# Patient Record
Sex: Female | Born: 1950
Health system: Southern US, Community
[De-identification: ages and names within clinical notes are randomized; demographics above are authoritative.]

## PROBLEM LIST (undated history)

## (undated) ENCOUNTER — Emergency Department (HOSPITAL_COMMUNITY): Admission: EM | Payer: Medicare Other | Source: Home / Self Care

## (undated) ENCOUNTER — Emergency Department (HOSPITAL_COMMUNITY): Payer: Medicare Other | Source: Home / Self Care

## (undated) DIAGNOSIS — K922 Gastrointestinal hemorrhage, unspecified: Secondary | ICD-10-CM

## (undated) DIAGNOSIS — I499 Cardiac arrhythmia, unspecified: Secondary | ICD-10-CM

## (undated) DIAGNOSIS — G001 Pneumococcal meningitis: Secondary | ICD-10-CM

## (undated) DIAGNOSIS — J449 Chronic obstructive pulmonary disease, unspecified: Secondary | ICD-10-CM

## (undated) DIAGNOSIS — M797 Fibromyalgia: Secondary | ICD-10-CM

## (undated) DIAGNOSIS — F1911 Other psychoactive substance abuse, in remission: Secondary | ICD-10-CM

## (undated) DIAGNOSIS — I639 Cerebral infarction, unspecified: Secondary | ICD-10-CM

## (undated) DIAGNOSIS — R011 Cardiac murmur, unspecified: Secondary | ICD-10-CM

## (undated) DIAGNOSIS — K5790 Diverticulosis of intestine, part unspecified, without perforation or abscess without bleeding: Secondary | ICD-10-CM

## (undated) DIAGNOSIS — N281 Cyst of kidney, acquired: Secondary | ICD-10-CM

## (undated) DIAGNOSIS — R112 Nausea with vomiting, unspecified: Secondary | ICD-10-CM

## (undated) DIAGNOSIS — D62 Acute posthemorrhagic anemia: Secondary | ICD-10-CM

## (undated) DIAGNOSIS — K862 Cyst of pancreas: Secondary | ICD-10-CM

## (undated) DIAGNOSIS — A0472 Enterocolitis due to Clostridium difficile, not specified as recurrent: Secondary | ICD-10-CM

## (undated) DIAGNOSIS — I10 Essential (primary) hypertension: Secondary | ICD-10-CM

## (undated) DIAGNOSIS — M1990 Unspecified osteoarthritis, unspecified site: Secondary | ICD-10-CM

## (undated) DIAGNOSIS — C801 Malignant (primary) neoplasm, unspecified: Secondary | ICD-10-CM

## (undated) DIAGNOSIS — K219 Gastro-esophageal reflux disease without esophagitis: Secondary | ICD-10-CM

## (undated) DIAGNOSIS — Z9889 Other specified postprocedural states: Secondary | ICD-10-CM

## (undated) HISTORY — PX: ABDOMINAL HYSTERECTOMY: SHX81

## (undated) HISTORY — PX: OVARIAN CYST SURGERY: SHX726

## (undated) HISTORY — DX: Cerebral infarction, unspecified: I63.9

## (undated) HISTORY — DX: Gastro-esophageal reflux disease without esophagitis: K21.9

## (undated) HISTORY — PX: FRACTURE SURGERY: SHX138

## (undated) HISTORY — DX: Cardiac murmur, unspecified: R01.1

## (undated) HISTORY — DX: Cyst of pancreas: K86.2

## (undated) HISTORY — PX: APPENDECTOMY: SHX54

## (undated) HISTORY — PX: BREAST SURGERY: SHX581

## (undated) HISTORY — DX: Cyst of kidney, acquired: N28.1

## (undated) HISTORY — DX: Enterocolitis due to Clostridium difficile, not specified as recurrent: A04.72

## (undated) HISTORY — DX: Unspecified osteoarthritis, unspecified site: M19.90

## (undated) HISTORY — DX: Acute posthemorrhagic anemia: D62

## (undated) HISTORY — DX: Gastrointestinal hemorrhage, unspecified: K92.2

## (undated) HISTORY — DX: Diverticulosis of intestine, part unspecified, without perforation or abscess without bleeding: K57.90

## (undated) HISTORY — DX: Pneumococcal meningitis: G00.1

## (undated) HISTORY — PX: ABDOMINAL SURGERY: SHX537

---

## 1997-08-22 ENCOUNTER — Other Ambulatory Visit: Admission: RE | Admit: 1997-08-22 | Discharge: 1997-08-22 | Payer: Self-pay | Admitting: *Deleted

## 1997-08-31 ENCOUNTER — Other Ambulatory Visit: Admission: RE | Admit: 1997-08-31 | Discharge: 1997-08-31 | Payer: Self-pay | Admitting: *Deleted

## 1997-09-06 ENCOUNTER — Ambulatory Visit (HOSPITAL_COMMUNITY): Admission: RE | Admit: 1997-09-06 | Discharge: 1997-09-06 | Payer: Self-pay | Admitting: *Deleted

## 1997-10-19 ENCOUNTER — Emergency Department (HOSPITAL_COMMUNITY): Admission: EM | Admit: 1997-10-19 | Discharge: 1997-10-19 | Payer: Self-pay | Admitting: Emergency Medicine

## 1997-10-19 ENCOUNTER — Ambulatory Visit (HOSPITAL_COMMUNITY): Admission: RE | Admit: 1997-10-19 | Discharge: 1997-10-19 | Payer: Self-pay | Admitting: *Deleted

## 1997-10-27 ENCOUNTER — Ambulatory Visit (HOSPITAL_COMMUNITY): Admission: RE | Admit: 1997-10-27 | Discharge: 1997-10-27 | Payer: Self-pay | Admitting: *Deleted

## 1997-11-21 ENCOUNTER — Ambulatory Visit (HOSPITAL_BASED_OUTPATIENT_CLINIC_OR_DEPARTMENT_OTHER): Admission: RE | Admit: 1997-11-21 | Discharge: 1997-11-21 | Payer: Self-pay | Admitting: Urology

## 1997-11-21 ENCOUNTER — Encounter: Payer: Self-pay | Admitting: Urology

## 1997-12-02 ENCOUNTER — Encounter: Payer: Self-pay | Admitting: Gastroenterology

## 1997-12-02 ENCOUNTER — Encounter: Payer: Self-pay | Admitting: Emergency Medicine

## 1997-12-02 ENCOUNTER — Inpatient Hospital Stay (HOSPITAL_COMMUNITY): Admission: EM | Admit: 1997-12-02 | Discharge: 1997-12-04 | Payer: Self-pay | Admitting: Emergency Medicine

## 1997-12-24 ENCOUNTER — Emergency Department (HOSPITAL_COMMUNITY): Admission: EM | Admit: 1997-12-24 | Discharge: 1997-12-24 | Payer: Self-pay | Admitting: Emergency Medicine

## 1998-04-08 ENCOUNTER — Emergency Department (HOSPITAL_COMMUNITY): Admission: EM | Admit: 1998-04-08 | Discharge: 1998-04-08 | Payer: Self-pay | Admitting: Emergency Medicine

## 1998-10-29 ENCOUNTER — Emergency Department (HOSPITAL_COMMUNITY): Admission: EM | Admit: 1998-10-29 | Discharge: 1998-10-29 | Payer: Self-pay | Admitting: Emergency Medicine

## 1998-10-29 ENCOUNTER — Encounter: Payer: Self-pay | Admitting: Emergency Medicine

## 1999-02-06 ENCOUNTER — Emergency Department (HOSPITAL_COMMUNITY): Admission: EM | Admit: 1999-02-06 | Discharge: 1999-02-06 | Payer: Self-pay | Admitting: Emergency Medicine

## 1999-02-06 ENCOUNTER — Encounter: Payer: Self-pay | Admitting: Emergency Medicine

## 1999-02-07 ENCOUNTER — Inpatient Hospital Stay (HOSPITAL_COMMUNITY): Admission: EM | Admit: 1999-02-07 | Discharge: 1999-02-09 | Payer: Self-pay | Admitting: Emergency Medicine

## 1999-02-27 ENCOUNTER — Emergency Department (HOSPITAL_COMMUNITY): Admission: EM | Admit: 1999-02-27 | Discharge: 1999-02-27 | Payer: Self-pay

## 1999-07-25 ENCOUNTER — Encounter: Payer: Self-pay | Admitting: Emergency Medicine

## 1999-07-25 ENCOUNTER — Emergency Department (HOSPITAL_COMMUNITY): Admission: EM | Admit: 1999-07-25 | Discharge: 1999-07-25 | Payer: Self-pay | Admitting: Emergency Medicine

## 1999-10-16 ENCOUNTER — Emergency Department (HOSPITAL_COMMUNITY): Admission: EM | Admit: 1999-10-16 | Discharge: 1999-10-16 | Payer: Self-pay | Admitting: Emergency Medicine

## 2000-01-07 ENCOUNTER — Emergency Department (HOSPITAL_COMMUNITY): Admission: EM | Admit: 2000-01-07 | Discharge: 2000-01-07 | Payer: Self-pay | Admitting: Emergency Medicine

## 2000-03-10 ENCOUNTER — Other Ambulatory Visit (HOSPITAL_COMMUNITY): Admission: RE | Admit: 2000-03-10 | Discharge: 2000-03-19 | Payer: Self-pay | Admitting: Psychiatry

## 2000-04-01 ENCOUNTER — Encounter: Payer: Self-pay | Admitting: Infectious Diseases

## 2000-04-01 ENCOUNTER — Inpatient Hospital Stay (HOSPITAL_COMMUNITY): Admission: EM | Admit: 2000-04-01 | Discharge: 2000-04-03 | Payer: Self-pay | Admitting: *Deleted

## 2000-04-02 ENCOUNTER — Encounter: Payer: Self-pay | Admitting: Infectious Diseases

## 2000-04-17 ENCOUNTER — Emergency Department (HOSPITAL_COMMUNITY): Admission: EM | Admit: 2000-04-17 | Discharge: 2000-04-17 | Payer: Self-pay | Admitting: Emergency Medicine

## 2000-04-21 ENCOUNTER — Emergency Department (HOSPITAL_COMMUNITY): Admission: EM | Admit: 2000-04-21 | Discharge: 2000-04-21 | Payer: Self-pay | Admitting: Emergency Medicine

## 2000-04-29 ENCOUNTER — Encounter: Admission: RE | Admit: 2000-04-29 | Discharge: 2000-04-29 | Payer: Self-pay | Admitting: Internal Medicine

## 2000-11-17 ENCOUNTER — Emergency Department (HOSPITAL_COMMUNITY): Admission: EM | Admit: 2000-11-17 | Discharge: 2000-11-17 | Payer: Self-pay

## 2001-07-27 ENCOUNTER — Encounter: Payer: Self-pay | Admitting: *Deleted

## 2001-07-27 ENCOUNTER — Inpatient Hospital Stay (HOSPITAL_COMMUNITY): Admission: EM | Admit: 2001-07-27 | Discharge: 2001-07-29 | Payer: Self-pay | Admitting: Emergency Medicine

## 2001-07-30 ENCOUNTER — Encounter: Admission: RE | Admit: 2001-07-30 | Discharge: 2001-07-30 | Payer: Self-pay | Admitting: Sports Medicine

## 2001-08-11 ENCOUNTER — Ambulatory Visit (HOSPITAL_COMMUNITY): Admission: RE | Admit: 2001-08-11 | Discharge: 2001-08-11 | Payer: Self-pay | Admitting: Family Medicine

## 2001-08-11 ENCOUNTER — Encounter: Admission: RE | Admit: 2001-08-11 | Discharge: 2001-08-11 | Payer: Self-pay | Admitting: Sports Medicine

## 2001-08-18 ENCOUNTER — Encounter: Admission: RE | Admit: 2001-08-18 | Discharge: 2001-08-18 | Payer: Self-pay | Admitting: *Deleted

## 2001-08-19 ENCOUNTER — Emergency Department (HOSPITAL_COMMUNITY): Admission: EM | Admit: 2001-08-19 | Discharge: 2001-08-19 | Payer: Self-pay | Admitting: Emergency Medicine

## 2001-08-19 ENCOUNTER — Encounter: Payer: Self-pay | Admitting: Emergency Medicine

## 2001-08-21 ENCOUNTER — Encounter: Admission: RE | Admit: 2001-08-21 | Discharge: 2001-08-21 | Payer: Self-pay | Admitting: Family Medicine

## 2001-08-26 ENCOUNTER — Encounter: Admission: RE | Admit: 2001-08-26 | Discharge: 2001-08-26 | Payer: Self-pay | Admitting: Family Medicine

## 2002-03-27 ENCOUNTER — Encounter: Payer: Self-pay | Admitting: Emergency Medicine

## 2002-03-27 ENCOUNTER — Emergency Department (HOSPITAL_COMMUNITY): Admission: EM | Admit: 2002-03-27 | Discharge: 2002-03-27 | Payer: Self-pay | Admitting: Emergency Medicine

## 2002-04-14 ENCOUNTER — Inpatient Hospital Stay (HOSPITAL_COMMUNITY): Admission: EM | Admit: 2002-04-14 | Discharge: 2002-04-27 | Payer: Self-pay | Admitting: Psychiatry

## 2002-04-18 ENCOUNTER — Encounter: Payer: Self-pay | Admitting: Emergency Medicine

## 2002-04-18 ENCOUNTER — Emergency Department (HOSPITAL_COMMUNITY): Admission: EM | Admit: 2002-04-18 | Discharge: 2002-04-18 | Payer: Self-pay | Admitting: Emergency Medicine

## 2002-11-03 ENCOUNTER — Ambulatory Visit (HOSPITAL_COMMUNITY): Admission: RE | Admit: 2002-11-03 | Discharge: 2002-11-03 | Payer: Self-pay | Admitting: Internal Medicine

## 2002-11-03 ENCOUNTER — Encounter: Payer: Self-pay | Admitting: Internal Medicine

## 2002-11-04 ENCOUNTER — Emergency Department (HOSPITAL_COMMUNITY): Admission: EM | Admit: 2002-11-04 | Discharge: 2002-11-05 | Payer: Self-pay | Admitting: Emergency Medicine

## 2002-11-05 ENCOUNTER — Encounter: Payer: Self-pay | Admitting: Emergency Medicine

## 2003-03-12 DIAGNOSIS — F1911 Other psychoactive substance abuse, in remission: Secondary | ICD-10-CM

## 2003-03-12 HISTORY — DX: Other psychoactive substance abuse, in remission: F19.11

## 2003-09-27 ENCOUNTER — Encounter: Admission: RE | Admit: 2003-09-27 | Discharge: 2003-09-27 | Payer: Self-pay | Admitting: Family Medicine

## 2003-10-18 ENCOUNTER — Encounter: Admission: RE | Admit: 2003-10-18 | Discharge: 2003-10-18 | Payer: Self-pay | Admitting: Family Medicine

## 2004-01-14 ENCOUNTER — Emergency Department (HOSPITAL_COMMUNITY): Admission: EM | Admit: 2004-01-14 | Discharge: 2004-01-14 | Payer: Self-pay | Admitting: Emergency Medicine

## 2004-01-15 ENCOUNTER — Emergency Department (HOSPITAL_COMMUNITY): Admission: EM | Admit: 2004-01-15 | Discharge: 2004-01-16 | Payer: Self-pay | Admitting: Emergency Medicine

## 2004-01-16 ENCOUNTER — Emergency Department: Payer: Self-pay | Admitting: Emergency Medicine

## 2004-01-27 ENCOUNTER — Emergency Department (HOSPITAL_COMMUNITY): Admission: EM | Admit: 2004-01-27 | Discharge: 2004-01-27 | Payer: Self-pay | Admitting: Emergency Medicine

## 2004-12-30 ENCOUNTER — Emergency Department (HOSPITAL_COMMUNITY): Admission: EM | Admit: 2004-12-30 | Discharge: 2004-12-31 | Payer: Self-pay | Admitting: *Deleted

## 2006-05-08 DIAGNOSIS — F411 Generalized anxiety disorder: Secondary | ICD-10-CM

## 2006-05-08 DIAGNOSIS — F17210 Nicotine dependence, cigarettes, uncomplicated: Secondary | ICD-10-CM | POA: Insufficient documentation

## 2006-05-08 DIAGNOSIS — J45909 Unspecified asthma, uncomplicated: Secondary | ICD-10-CM | POA: Insufficient documentation

## 2006-05-08 DIAGNOSIS — K219 Gastro-esophageal reflux disease without esophagitis: Secondary | ICD-10-CM | POA: Insufficient documentation

## 2006-05-08 DIAGNOSIS — R569 Unspecified convulsions: Secondary | ICD-10-CM | POA: Insufficient documentation

## 2006-05-08 DIAGNOSIS — F419 Anxiety disorder, unspecified: Secondary | ICD-10-CM | POA: Insufficient documentation

## 2006-05-08 DIAGNOSIS — K5900 Constipation, unspecified: Secondary | ICD-10-CM | POA: Insufficient documentation

## 2006-05-30 ENCOUNTER — Emergency Department (HOSPITAL_COMMUNITY): Admission: EM | Admit: 2006-05-30 | Discharge: 2006-05-30 | Payer: Self-pay | Admitting: Family Medicine

## 2006-05-31 ENCOUNTER — Emergency Department (HOSPITAL_COMMUNITY): Admission: EM | Admit: 2006-05-31 | Discharge: 2006-05-31 | Payer: Self-pay | Admitting: Emergency Medicine

## 2006-06-11 ENCOUNTER — Emergency Department (HOSPITAL_COMMUNITY): Admission: EM | Admit: 2006-06-11 | Discharge: 2006-06-12 | Payer: Self-pay | Admitting: Emergency Medicine

## 2006-06-15 ENCOUNTER — Emergency Department (HOSPITAL_COMMUNITY): Admission: EM | Admit: 2006-06-15 | Discharge: 2006-06-15 | Payer: Self-pay | Admitting: Emergency Medicine

## 2006-09-07 ENCOUNTER — Emergency Department (HOSPITAL_COMMUNITY): Admission: EM | Admit: 2006-09-07 | Discharge: 2006-09-07 | Payer: Self-pay | Admitting: Emergency Medicine

## 2006-09-08 ENCOUNTER — Emergency Department (HOSPITAL_COMMUNITY): Admission: EM | Admit: 2006-09-08 | Discharge: 2006-09-08 | Payer: Self-pay | Admitting: Emergency Medicine

## 2006-09-09 ENCOUNTER — Emergency Department (HOSPITAL_COMMUNITY): Admission: EM | Admit: 2006-09-09 | Discharge: 2006-09-09 | Payer: Self-pay | Admitting: Emergency Medicine

## 2006-09-11 ENCOUNTER — Emergency Department (HOSPITAL_COMMUNITY): Admission: EM | Admit: 2006-09-11 | Discharge: 2006-09-11 | Payer: Self-pay | Admitting: Emergency Medicine

## 2006-09-17 ENCOUNTER — Emergency Department (HOSPITAL_COMMUNITY): Admission: EM | Admit: 2006-09-17 | Discharge: 2006-09-17 | Payer: Self-pay | Admitting: Family Medicine

## 2006-10-01 ENCOUNTER — Emergency Department (HOSPITAL_COMMUNITY): Admission: EM | Admit: 2006-10-01 | Discharge: 2006-10-01 | Payer: Self-pay | Admitting: Emergency Medicine

## 2006-10-04 ENCOUNTER — Emergency Department (HOSPITAL_COMMUNITY): Admission: EM | Admit: 2006-10-04 | Discharge: 2006-10-04 | Payer: Self-pay | Admitting: Emergency Medicine

## 2006-10-19 ENCOUNTER — Emergency Department (HOSPITAL_COMMUNITY): Admission: EM | Admit: 2006-10-19 | Discharge: 2006-10-19 | Payer: Self-pay | Admitting: Emergency Medicine

## 2006-12-06 ENCOUNTER — Emergency Department (HOSPITAL_COMMUNITY): Admission: EM | Admit: 2006-12-06 | Discharge: 2006-12-06 | Payer: Self-pay | Admitting: Emergency Medicine

## 2006-12-11 ENCOUNTER — Emergency Department (HOSPITAL_COMMUNITY): Admission: EM | Admit: 2006-12-11 | Discharge: 2006-12-11 | Payer: Self-pay | Admitting: Emergency Medicine

## 2006-12-31 ENCOUNTER — Emergency Department (HOSPITAL_COMMUNITY): Admission: EM | Admit: 2006-12-31 | Discharge: 2006-12-31 | Payer: Self-pay | Admitting: Emergency Medicine

## 2007-01-22 ENCOUNTER — Inpatient Hospital Stay (HOSPITAL_COMMUNITY): Admission: EM | Admit: 2007-01-22 | Discharge: 2007-01-22 | Payer: Self-pay | Admitting: Emergency Medicine

## 2007-01-24 ENCOUNTER — Inpatient Hospital Stay (HOSPITAL_COMMUNITY): Admission: EM | Admit: 2007-01-24 | Discharge: 2007-01-26 | Payer: Self-pay | Admitting: *Deleted

## 2007-04-02 ENCOUNTER — Emergency Department (HOSPITAL_COMMUNITY): Admission: EM | Admit: 2007-04-02 | Discharge: 2007-04-02 | Payer: Self-pay | Admitting: Emergency Medicine

## 2007-05-20 ENCOUNTER — Emergency Department (HOSPITAL_COMMUNITY): Admission: EM | Admit: 2007-05-20 | Discharge: 2007-05-20 | Payer: Self-pay | Admitting: Emergency Medicine

## 2009-04-04 ENCOUNTER — Emergency Department (HOSPITAL_COMMUNITY): Admission: EM | Admit: 2009-04-04 | Discharge: 2009-04-04 | Payer: Self-pay | Admitting: Emergency Medicine

## 2009-04-06 ENCOUNTER — Emergency Department (HOSPITAL_COMMUNITY): Admission: EM | Admit: 2009-04-06 | Discharge: 2009-04-07 | Payer: Self-pay | Admitting: Emergency Medicine

## 2010-03-15 ENCOUNTER — Encounter
Admission: RE | Admit: 2010-03-15 | Discharge: 2010-03-15 | Payer: Self-pay | Source: Home / Self Care | Attending: Specialist | Admitting: Specialist

## 2010-04-01 ENCOUNTER — Encounter: Payer: Self-pay | Admitting: Emergency Medicine

## 2010-04-01 ENCOUNTER — Encounter: Payer: Self-pay | Admitting: Neurosurgery

## 2010-05-27 LAB — COMPREHENSIVE METABOLIC PANEL
ALT: 31 U/L (ref 0–35)
AST: 38 U/L — ABNORMAL HIGH (ref 0–37)
Albumin: 4.5 g/dL (ref 3.5–5.2)
Alkaline Phosphatase: 73 U/L (ref 39–117)
BUN: 13 mg/dL (ref 6–23)
CO2: 27 mEq/L (ref 19–32)
Calcium: 9.9 mg/dL (ref 8.4–10.5)
Chloride: 102 mEq/L (ref 96–112)
Creatinine, Ser: 0.7 mg/dL (ref 0.4–1.2)
GFR calc Af Amer: >60 mL/min (ref >60)
GFR calc non Af Amer: >60 mL/min (ref >60)
Glucose, Bld: 115 mg/dL — ABNORMAL HIGH (ref 70–99)
Potassium: 3.5 mEq/L (ref 3.5–5.1)
Sodium: 139 mEq/L (ref 135–145)
Total Bilirubin: 1.4 mg/dL — ABNORMAL HIGH (ref 0.3–1.2)
Total Protein: 7.5 g/dL (ref 6.0–8.3)

## 2010-05-27 LAB — POCT I-STAT, CHEM 8
BUN: 10 mg/dL (ref 6–23)
Calcium, Ion: 1.17 mmol/L (ref 1.12–1.32)
Chloride: 109 mEq/L (ref 96–112)
Creatinine, Ser: 0.8 mg/dL (ref 0.4–1.2)
Glucose, Bld: 91 mg/dL (ref 70–99)
HCT: 43 % (ref 36.0–46.0)
Hemoglobin: 14.6 g/dL (ref 12.0–15.0)
Potassium: 3.4 mEq/L — ABNORMAL LOW (ref 3.5–5.1)
Sodium: 141 mEq/L (ref 135–145)
TCO2: 29 mmol/L (ref 0–100)

## 2010-05-27 LAB — DIFFERENTIAL
Basophils Absolute: 0.1 10*3/uL (ref 0.0–0.1)
Basophils Absolute: 0.1 10*3/uL (ref 0.0–0.1)
Basophils Relative: 1 % (ref 0–1)
Basophils Relative: 1 % (ref 0–1)
Eosinophils Absolute: 0 10*3/uL (ref 0.0–0.7)
Eosinophils Absolute: 0 10*3/uL (ref 0.0–0.7)
Eosinophils Relative: 0 % (ref 0–5)
Eosinophils Relative: 0 % (ref 0–5)
Lymphocytes Relative: 14 % (ref 12–46)
Lymphocytes Relative: 23 % (ref 12–46)
Lymphs Abs: 2.3 10*3/uL (ref 0.7–4.0)
Lymphs Abs: 3.8 10*3/uL (ref 0.7–4.0)
Monocytes Absolute: 0.7 10*3/uL (ref 0.1–1.0)
Monocytes Absolute: 1 10*3/uL (ref 0.1–1.0)
Monocytes Relative: 4 % (ref 3–12)
Monocytes Relative: 6 % (ref 3–12)
Neutro Abs: 12 10*3/uL — ABNORMAL HIGH (ref 1.7–7.7)
Neutro Abs: 12.6 10*3/uL — ABNORMAL HIGH (ref 1.7–7.7)
Neutrophils Relative %: 72 % (ref 43–77)
Neutrophils Relative %: 79 % — ABNORMAL HIGH (ref 43–77)

## 2010-05-27 LAB — CBC
HCT: 38.9 % (ref 36.0–46.0)
HCT: 43.1 % (ref 36.0–46.0)
Hemoglobin: 13.7 g/dL (ref 12.0–15.0)
Hemoglobin: 14.9 g/dL (ref 12.0–15.0)
MCHC: 34.6 g/dL (ref 30.0–36.0)
MCHC: 35.2 g/dL (ref 30.0–36.0)
MCV: 94.4 fL (ref 78.0–100.0)
MCV: 94.6 fL (ref 78.0–100.0)
Platelets: 357 10*3/uL (ref 150–400)
Platelets: 406 10*3/uL — ABNORMAL HIGH (ref 150–400)
RBC: 4.11 MIL/uL (ref 3.87–5.11)
RBC: 4.57 MIL/uL (ref 3.87–5.11)
RDW: 13.9 % (ref 11.5–15.5)
RDW: 14 % (ref 11.5–15.5)
WBC: 16 10*3/uL — ABNORMAL HIGH (ref 4.0–10.5)
WBC: 16.6 10*3/uL — ABNORMAL HIGH (ref 4.0–10.5)

## 2010-05-27 LAB — URINE MICROSCOPIC-ADD ON

## 2010-05-27 LAB — POCT CARDIAC MARKERS
CKMB, poc: 1.6 ng/mL (ref 1.0–8.0)
Myoglobin, poc: 65.4 ng/mL (ref 12–200)
Troponin i, poc: <0.05 ng/mL (ref 0.00–0.09)

## 2010-05-27 LAB — URINALYSIS, ROUTINE W REFLEX MICROSCOPIC
Glucose, UA: NEGATIVE mg/dL
Ketones, ur: 40 mg/dL — AB
Nitrite: NEGATIVE
Protein, ur: 30 mg/dL — AB
Specific Gravity, Urine: 1.023 (ref 1.005–1.030)
Urobilinogen, UA: 1 mg/dL (ref 0.0–1.0)
pH: 6 (ref 5.0–8.0)

## 2010-05-27 LAB — LIPASE, BLOOD: Lipase: 21 U/L (ref 11–59)

## 2010-07-24 NOTE — Discharge Summary (Signed)
NAMEDemetrius Charity NO.:  [id number]   MEDICAL RECORD NO.:  [id number]          PATIENT TYPE:  INP   LOCATION:  1528                         FACILITY:  [facility]   PHYSICIAN:  Michaelyn Barter, M.D. DATE OF BIRTH:  02-Sep-1950   DATE OF ADMISSION:  01/24/2007  DATE OF DISCHARGE:  01/26/2007                               DISCHARGE SUMMARY   PATIENT PRIMARY CARE DOCTOR:  Unassigned.   FINAL DIAGNOSES:  1. Colitis.  2. Hypokalemia.  3. Hypoalbuminemia.  4. Chronic pain syndrome.  5. Hepatomegaly with a hyperdense liver seen on CT scan of the abdomen      and pelvis   PROCEDURES:  1. Acute abdominal x-ray completed January 22, 2007.  2. CT scan of the abdomen and pelvis completed January 22, 2007.  3. Acute abdominal x-ray completed January 24, 2007.   HISTORY OF PRESENT ILLNESS:  [name] is a 60 year old female who was  initially admitted into the hospital 2 days prior to this admission.  She subsequently signed herself out AMA.  She indicated that she had  continued to experience diarrhea accompanied by nausea and vomiting.  She also complained of some low grade fevers and chills.   PAST MEDICAL HISTORY:  Please see that dictated by Dr. Jonna Munro.   HOSPITAL COURSE:  #1 - COLITIS.  An acute abdominal x-ray was completed  on November 13 it revealed no acute chest or abdominal disease.  CT scan  of the patient's abdomen and pelvis were completed on January 22, 2007.  Hepatomegaly with a hyperdense liver was seen and differential  considerations included hemochromatosis or glycogen storage disease.  Amiodarone therapy could also create a similar appearance.  A CT scan of  the pelvis revealed diffuse wall thickening of the colon compatible with  colitis, likely infectious or inflammatory.  IV ciprofloxacin and  metronidazole were started as well as p.r.n. pain medication.  The  patient's pain was slow to resolve.  She remained afebrile.  Her  white  blood cell count remained within normal limits.  By the day of  discharge, the patient indicated that she felt better.  She indicated  that her pain was controlled, and she requested to be discharged from  the hospital.   #2 - HYPOKALEMIA.  The patient's potassium level was noted to be 2.8 on  November 15.  For this, she received potassium supplementation.   #3 - HYPOALBUMINEMIA.  There may have been an element of mild protein  calorie malnutrition secondary to this.  This was monitored as the  patient was initially n.p.o. and her diet was progressed.   #4 - CHRONIC PAIN.  The patient did complain of pain in various  locations during the course of her hospitalization.  The acuity of these  complaints were questionable.  She was provided with p.r.n. pain  medication.  On the date of discharge, the patient indicated that she  felt better and she requested to be discharged home.  Her vitals are  temperature was 96.9, heart rate 68, respirations 16, blood pressure  110/78, O2 sat 98%  on room air.  Her sodium was 143, potassium 4.0,  chloride 111, CO2 28, BUN 5, creatinine 0.63, glucose 137, calcium 8.5,  white blood cell count 7.3, hemoglobin 11.6, hematocrit 33.6, platelets  361.  The decision was made to discharge the patient from the hospital.   DISCHARGE MEDICATIONS:  1. Protonix 40 mg one tablet p.o. daily.  2. Oxycodone 5 mg one tablet p.o. q.6 h p.r.n.  3. Zofran 4 mg one tablet p.o. q.6 h p.r.n.  4. Ciprofloxacin 250 mg one tablet p.o. q.12 h.  5. Metronidazole 500 mg one tablet p.o. q.8 h.  6. Xanax 0.5 mg 1-2 p.o. b.i.d.   FOLLOWUP:  The patient will be told to follow-up with her primary care  doctor within 1-2 weeks.  She will also be told to follow-up with  regards to the CT scan finding of hepatomegaly with hyperdense liver.      Michaelyn Barter, M.D.  Electronically Signed     OR/MEDQ  D:  01/26/2007  T:  01/26/2007  Job:  [id number]
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
## 2010-07-27 NOTE — H&P (Signed)
NAMEDemetrius Cardenas NO.:  [id number]   MEDICAL RECORD NO.:  [id number]                   PATIENT TYPE:  IPS   LOCATION:  0505                                 [facility]ACILITY[facility]  [facility]   PHYSICIAN:  [name], M.D.              DATE OF BIRTH:  09/20/1950   DATE OF ADMISSION:  04/14/2002  DATE OF DISCHARGE:  04/27/2002                         PSYCHIATRIC ADMISSION ASSESSMENT   IDENTIFYING INFORMATION:  This is a 60 year old Caucasian female who is an  involuntary admission.   HISTORY OF PRESENT ILLNESS:  This patient was transferred from [facility] after being there for a witnessed seizure that started when she was  in prison.  Apparently, it was serious enough that the patient lost her gag  reflex and had to be intubated.  She was loaded with Dilantin at that time  and, after she became awake in the course of her workup, discovered that she  had overdosed herself on Klonopin.  She has a long history of opiate abuse,  abusing methadone and other opiates and also benzodiazepine use.  She  stated, to the doctors at [facility], I took too much Klonopin.  The patient's history today is taken from the records.  She is delusional  and confused and is an unreliable historian.   PAST PSYCHIATRIC HISTORY:  This is the patient's first admission to [facility]  She has two prior admissions to the internal  medicine service.  She has a previous history of multiple admissions to  [facility] and other detox facilities.   SOCIAL HISTORY:  The patient has a 12th grade education.  Her husband is  listed as Sharman Crate and we have attempted to contact him today.  However,  he has no phone number.  The patient has Medicare and Medicaid insurance,  which will help her pay for medications.  She is unable to tell us little  else about her history.   FAMILY HISTORY:  Not available.   ALCOHOL/DRUG HISTORY:  The  patient has a known history of abusing opiates  and benzodiazepines.   PAST MEDICAL HISTORY:  The patient is followed by Dr. Tiburcio Pea apparently at  the [facility], which is a methadone clinic in town.  Medical problems  include a history of hypertension, controlled with medications, history of  heart trouble, complicated by congestive heart failure in the past, some  visual impairment of her left eye.  Past medical history is remarkable for a  history of gastric ulcer.  She is status post hysterectomy and it is noted  in her hospital records as having a myocardial infarction twice in the past.   MEDICATIONS:  Noted from at the time she was transferred.  Dilantin 300 mg  p.o. q.h.s., Lasix 40 mg q.d., Remeron 15 mg p.o. q.h.s., Neurontin 100 mg  p.o. t.i.d., Vistaril 50 mg q.6h. p.r.n.  for anxiety, Flagyl 500 mg p.o.  t.i.d. and Zithromax 500 mg p.o. q.d.   ALLERGIES:  ASPIRIN, PENTAZAFINE, CODEINE and IBUPROFEN.   PHYSICAL EXAMINATION:  The patient's physical examination was done at  [facility] and is noted in the record.  At that time, she was  intubated and on a ventilator for support.  Her lungs were clear to  auscultation.  Today, her lungs continue to sound generally clear.  Her  heart sounds are grossly normal and synchronous with her radial pulse and of  regular rate and rhythm.  Vital signs, on admission to the unit, are within  normal limits.   LABORATORY DATA:  The patient's WBC was noted as mildly elevated at 14,100.  Most recent liver enzymes were within normal limits as was her creatinine  and BUN and her cardiac enzymes were negative.  Her RPR was noted as  nonreactive.   MENTAL STATUS EXAM:  This is a thin, frail-appearing, white female who is  wandering in the hall in a gown.  She responds to questions with a perplexed  look and inappropriate smile.  She looks around and is generally confused  and looks lost.  Her affect is blunted and she states to me I want  to know  when I can go downstairs.  I'm going to be put to sleep and killed.  Speech  lacks spontaneity but shows no pressure and is generally soft in tone,  somewhat anxious.  Mood is depressed and anxious.  Thought processes are  remarkable for confusion and delusional thoughts.  She is positive for  suicidal ideation but no clear plan and generally seems to be responding to  internal stimuli.  Cognitively, she is intact to person and place and she  does know what day it is but obviously does not appreciate her situation.   DIAGNOSES:   AXIS I:  1. Psychosis not otherwise specified.  2. Benzodiazepine abuse and dependence.  3. Opiate dependence by history.   AXIS II:  Deferred.   AXIS III:  1. Seizure disorder not otherwise specified, probably secondary to     benzodiazepine withdrawal.  2. She is status post overdose on benzodiazepines.  3. She is also status post myocardial infarction with a history of     congestive heart failure.   AXIS IV:  Deferred.   AXIS V:  Current 10; past year unknown.   PLAN:  Involuntarily admit the patient to evaluate her mental status.  We  have elected to start her on a Librium protocol with no loading dose at this  point.  We are going to place her on fall precautions for her own safety and  immediately get baseline labs on her including a Dilantin level, CBC,  thyroid panel and CMET.  Meanwhile, we will start her on a gradual taper of  her methadone with now starting her on 20 mg p.o. b.i.d. and will also taper  her off her azithromycin and will recheck her CBC levels since she was  previously intubated.  We are also going to start her on Zyprexa 2.5 mg p.o.  b.i.d. and 2.5 mg p.o. q.h.s.  Meanwhile, we will check her pulse and blood  pressure every shift along with daily weights and will monitor her for chest pain.  We have made available to her nitroglycerin 0.4 mg p.o. q.4h. p.r.n.  as needed for any chest pain should that occur.  We  will give her Imodium  for diarrhea and encourage food  and fluids and monitor her closely.   ESTIMATED LENGTH OF STAY:  Eight days.     Margaret A. Stephannie Peters                   [name], M.D.    MAS/MEDQ  D:  05/18/2002  T:  05/18/2002  Job:  [id number]

## 2010-07-27 NOTE — Discharge Summary (Signed)
NAME:  [name] NO.:  [id number]   MEDICAL RECORD NO.:  [id number]                   PATIENT TYPE:  IPS   LOCATION:  0505                                 FACILITY:  [facility]   PHYSICIAN:  Jeanice Lim, M.D.              DATE OF BIRTH:  January 12, 1951   DATE OF ADMISSION:  04/14/2002  DATE OF DISCHARGE:  04/27/2002                                 DISCHARGE SUMMARY   IDENTIFYING DATA:  This is a 60 year old Caucasian female voluntarily  admitted, transferred from [facility] after having four seizures,  which began when she was in prison.  The patient lost her gag reflex, was  intubated and loaded with Dilantin.  She had a long history of methadone  maintenance due to a history of opiate dependence and had been taking  increased amounts of Klonopin, which she was not given in the jail.  The  patient had a history of multiple admissions to [facility].   MEDICATIONS:  Medications were continued from [facility].   DRUG ALLERGIES:  ASPIRIN, CODEINE, and MOTRIN.   PHYSICAL EXAMINATION:  GENERAL: Essentially within normal limits.  NEUROLOGIC: Nonfocal.   LABORATORY DATA:  Urine drug screen was positive for benzodiazepines,  barbiturates, and opiates.  Otherwise, labs within normal limits except for  a slightly elevated white count at 14.1.   MENTAL STATUS EXAM:  Thin, frail female, appearing confused, perplexed, with  poverty of speech.  Mood was depressed, anxious, and confused.  Affect:  Anxious and tearful.  Thought process was somewhat disorganized and  significantly delusional, having a belief that her husband had killed three  people while she was in jail, then her brother had killed her husband, and  there were no Altons meant to be left on the earth and therefore, she was  here to be put asleep.  She was grossly cognitively intact.  Judgment and  insight: Poor.   ADMISSION DIAGNOSES:   AXIS I:  1. Psychosis, not otherwise  specified.  2. Benzodiazepine abuse/dependence.  3. Opiate dependence.  4. Likely benzodiazepine withdrawal syndrome.   AXIS II:  None.   AXIS III:  1. Seizure disorder.  2. Status post myocardial infarction x 2.  3. Congestive heart failure.   AXIS IV:  Moderate problems with limited support system.   AXIS V:  20/55   HOSPITAL COURSE:  The patient was admitted, ordered routine p.r.n.  medications, underwent further monitoring, and was encouraged to participate  in individual, group, and milieu treatment.  The patient was continued on  medications from [facility].  She remained severely delusional with  some changes in her orientation, suggesting possible superimposed delirium  and possible prolonged withdrawal.  The patient was optimized on Dilantin  and continued on detoxification protocol.  Zyprexa was started for psychotic  symptoms and mood symptoms.  The patient gradually, over a period of several  days,  showed slow improvement in delusional thoughts and paranoid ideation  and thoughts of wanting to be put to sleep until eventually, these  delusional thoughts resolved and she gained insight that this was part of  the hallucination and not reality.  She reported a robust response to  clinical intervention and appeared to be highly motivated to be compliant  with the aftercare plan, which she participated in along with her family.   CONDITION ON DISCHARGE:  She was discharged in improved condition.  Mood was  more euthymic.  Affect: Brighter.  Thought processes: Goal directed.  Thought content: Negative for dangerous ideation or psychotic symptoms.  The  patient no longer had delusional thoughts or paranoid ideation.   DISCHARGE MEDICATIONS:  1. Methadone 20 mg in the morning and 40 mg at 6 p.m.  2. K-Dur 20 mEq daily.  3. Tenormin 50 mg daily.  4. Lasix 40 mg daily.  5. Neurontin 600 mg three times a day.  6. Dilantin 100 mg three at bedtime.  7. Zyprexa 5 mg  q.a.m. and 15 mg q.h.s.  8. Seroquel 100 mg one half q.a.m., one half at 1 p.m., one half at 6 p.m.,     and one half at bedtime.  Family was to secure the patient's medications.  9. Flexeril 10 mg t.i.d.  10.      Vistaril 25 mg t.i.d.  11.      Flonase 0.05% nasal spray p.r.n.   FOLLOW UP:  The patient was to follow up with A&D, [facility] on Thursday, February 19 at 10 a.m., and neurology on February  26 at 9 a.m.   DISCHARGE DIAGNOSES:   AXIS I:  1. Psychosis, not otherwise specified.  2. Benzodiazepine abuse/dependence.  3. Opiate dependence.  4. Likely benzodiazepine withdrawal syndrome.   AXIS II:  None.   AXIS III:  1. Seizure disorder.  2. Status post myocardial infarction x 2.  3. Congestive heart failure.    AXIS IV:  Moderate problems with limited support system.   AXIS V:  Global assessment of functioning on discharge was 50-55.                                                Jeanice Lim, M.D.    JEM/MEDQ  D:  05/12/2002  T:  05/12/2002  Job:  [id number]

## 2010-10-18 ENCOUNTER — Emergency Department (HOSPITAL_COMMUNITY): Payer: Medicare Other

## 2010-10-18 ENCOUNTER — Emergency Department (HOSPITAL_COMMUNITY)
Admission: EM | Admit: 2010-10-18 | Discharge: 2010-10-18 | Disposition: A | Discharge status: Home / Self Care | Payer: Medicare Other | Attending: Emergency Medicine | Admitting: Emergency Medicine

## 2010-10-18 DIAGNOSIS — R310 Gross hematuria: Secondary | ICD-10-CM | POA: Insufficient documentation

## 2010-10-18 DIAGNOSIS — N390 Urinary tract infection, site not specified: Secondary | ICD-10-CM | POA: Insufficient documentation

## 2010-10-18 DIAGNOSIS — Z90710 Acquired absence of both cervix and uterus: Secondary | ICD-10-CM | POA: Insufficient documentation

## 2010-10-18 DIAGNOSIS — M545 Low back pain, unspecified: Secondary | ICD-10-CM | POA: Insufficient documentation

## 2010-10-18 DIAGNOSIS — R109 Unspecified abdominal pain: Secondary | ICD-10-CM | POA: Insufficient documentation

## 2010-10-18 LAB — URINALYSIS, ROUTINE W REFLEX MICROSCOPIC
Bilirubin Urine: NEGATIVE
Glucose, UA: NEGATIVE mg/dL
Ketones, ur: NEGATIVE mg/dL
Nitrite: NEGATIVE
Protein, ur: NEGATIVE mg/dL
Specific Gravity, Urine: 1.023 (ref 1.005–1.030)
Urobilinogen, UA: 0.2 mg/dL (ref 0.0–1.0)
pH: 5.5 (ref 5.0–8.0)

## 2010-10-18 LAB — URINE MICROSCOPIC-ADD ON

## 2010-10-19 LAB — URINE CULTURE
Colony Count: NO GROWTH
Culture  Setup Time: 201208100151
Culture: NO GROWTH

## 2010-10-24 ENCOUNTER — Emergency Department (HOSPITAL_COMMUNITY)
Admission: EM | Admit: 2010-10-24 | Discharge: 2010-10-24 | Disposition: A | Discharge status: Home / Self Care | Payer: Medicare Other | Attending: Emergency Medicine | Admitting: Emergency Medicine

## 2010-10-24 DIAGNOSIS — M5412 Radiculopathy, cervical region: Secondary | ICD-10-CM | POA: Insufficient documentation

## 2010-10-24 DIAGNOSIS — R209 Unspecified disturbances of skin sensation: Secondary | ICD-10-CM | POA: Insufficient documentation

## 2010-10-24 DIAGNOSIS — M79609 Pain in unspecified limb: Secondary | ICD-10-CM | POA: Insufficient documentation

## 2010-10-24 DIAGNOSIS — M542 Cervicalgia: Secondary | ICD-10-CM | POA: Insufficient documentation

## 2010-10-29 ENCOUNTER — Other Ambulatory Visit: Payer: Self-pay | Admitting: Neurosurgery

## 2010-10-29 DIAGNOSIS — M542 Cervicalgia: Secondary | ICD-10-CM

## 2010-10-29 DIAGNOSIS — M5410 Radiculopathy, site unspecified: Secondary | ICD-10-CM

## 2010-10-29 DIAGNOSIS — IMO0002 Reserved for concepts with insufficient information to code with codable children: Secondary | ICD-10-CM

## 2010-10-30 ENCOUNTER — Other Ambulatory Visit: Payer: Medicare Other

## 2010-11-30 LAB — CBC
HCT: 41.1
Hemoglobin: 14.3
MCHC: 34.8
MCV: 91.6
Platelets: 357
RBC: 4.48
RDW: 13.4
WBC: 6.9

## 2010-11-30 LAB — DIFFERENTIAL
Basophils Absolute: 0
Basophils Relative: 1
Eosinophils Absolute: 0
Eosinophils Relative: 0
Lymphocytes Relative: 36
Lymphs Abs: 2.4
Monocytes Absolute: 0.4
Monocytes Relative: 6
Neutro Abs: 3.9
Neutrophils Relative %: 58

## 2010-11-30 LAB — COMPREHENSIVE METABOLIC PANEL
ALT: 16
AST: 20
Albumin: 4.2
Alkaline Phosphatase: 78
BUN: 15
CO2: 29
Calcium: 9.4
Chloride: 109
Creatinine, Ser: 1.07
GFR calc Af Amer: >60
GFR calc non Af Amer: 53 — ABNORMAL LOW
Glucose, Bld: 104 — ABNORMAL HIGH
Potassium: 3.8
Sodium: 143
Total Bilirubin: 0.9
Total Protein: 6.7

## 2010-11-30 LAB — URINALYSIS, ROUTINE W REFLEX MICROSCOPIC
Glucose, UA: NEGATIVE
Ketones, ur: 40 — AB
Leukocytes, UA: NEGATIVE
Nitrite: NEGATIVE
Protein, ur: NEGATIVE
Specific Gravity, Urine: 1.023
Urobilinogen, UA: 0.2
pH: 5.5

## 2010-11-30 LAB — URINE MICROSCOPIC-ADD ON

## 2010-12-03 LAB — COMPREHENSIVE METABOLIC PANEL
ALT: 12
AST: 15
Albumin: 4
Alkaline Phosphatase: 65
BUN: 10
CO2: 27
Calcium: 9.4
Chloride: 105
Creatinine, Ser: 0.81
GFR calc Af Amer: >60
GFR calc non Af Amer: >60
Glucose, Bld: 98
Potassium: 3.9
Sodium: 140
Total Bilirubin: 0.6
Total Protein: 6.6

## 2010-12-03 LAB — RAPID URINE DRUG SCREEN, HOSP PERFORMED
Amphetamines: NOT DETECTED
Barbiturates: NOT DETECTED
Benzodiazepines: NOT DETECTED
Cocaine: NOT DETECTED
Opiates: POSITIVE — AB
Tetrahydrocannabinol: NOT DETECTED

## 2010-12-03 LAB — DIFFERENTIAL
Basophils Absolute: 0.1
Basophils Relative: 1
Eosinophils Absolute: 0
Eosinophils Relative: 0
Lymphocytes Relative: 26
Lymphs Abs: 2.2
Monocytes Absolute: 0.5
Monocytes Relative: 6
Neutro Abs: 5.5
Neutrophils Relative %: 66

## 2010-12-03 LAB — CBC
HCT: 41.9
Hemoglobin: 14.2
MCHC: 33.9
MCV: 92.4
Platelets: 434 — ABNORMAL HIGH
RBC: 4.54
RDW: 14.8
WBC: 8.3

## 2010-12-03 LAB — URINALYSIS, ROUTINE W REFLEX MICROSCOPIC
Bilirubin Urine: NEGATIVE
Glucose, UA: NEGATIVE
Hgb urine dipstick: NEGATIVE
Ketones, ur: NEGATIVE
Nitrite: NEGATIVE
Protein, ur: NEGATIVE
Specific Gravity, Urine: 1.008
Urobilinogen, UA: 0.2
pH: 7.5

## 2010-12-03 LAB — ETHANOL: Alcohol, Ethyl (B): <5

## 2010-12-18 LAB — COMPREHENSIVE METABOLIC PANEL
ALT: 14
ALT: 15
ALT: 15
ALT: 13
AST: 11
AST: 13
AST: 16
AST: 15
Albumin: 3.3 — ABNORMAL LOW
Albumin: 3.4 — ABNORMAL LOW
Albumin: 3.9
Albumin: 3.3 — ABNORMAL LOW
Alkaline Phosphatase: 48
Alkaline Phosphatase: 55
Alkaline Phosphatase: 61
Alkaline Phosphatase: 52
BUN: 7
BUN: 7
BUN: 8
BUN: 14
CO2: 27
CO2: 27
CO2: 28
CO2: 25
Calcium: 8.4
Calcium: 8.5
Calcium: 9.2
Calcium: 8.5
Chloride: 105
Chloride: 107
Chloride: 109
Chloride: 113 — ABNORMAL HIGH
Creatinine, Ser: 0.73
Creatinine, Ser: 0.75
Creatinine, Ser: 0.83
Creatinine, Ser: 0.71
GFR calc Af Amer: >60
GFR calc Af Amer: >60
GFR calc Af Amer: >60
GFR calc Af Amer: >60
GFR calc non Af Amer: >60
GFR calc non Af Amer: >60
GFR calc non Af Amer: >60
GFR calc non Af Amer: >60
Glucose, Bld: 104 — ABNORMAL HIGH
Glucose, Bld: 86
Glucose, Bld: 94
Glucose, Bld: 153 — ABNORMAL HIGH
Potassium: 2.8 — ABNORMAL LOW
Potassium: 3 — ABNORMAL LOW
Potassium: 3.8
Potassium: 3.4 — ABNORMAL LOW
Sodium: 139
Sodium: 140
Sodium: 140
Sodium: 142
Total Bilirubin: 0.7
Total Bilirubin: 0.8
Total Bilirubin: 0.9
Total Bilirubin: 0.5
Total Protein: 5.2 — ABNORMAL LOW
Total Protein: 5.4 — ABNORMAL LOW
Total Protein: 6.5
Total Protein: 5.5 — ABNORMAL LOW

## 2010-12-18 LAB — BASIC METABOLIC PANEL
BUN: 5 — ABNORMAL LOW
CO2: 28
Calcium: 8.5
Chloride: 111
Creatinine, Ser: 0.63
GFR calc Af Amer: >60
GFR calc non Af Amer: >60
Glucose, Bld: 137 — ABNORMAL HIGH
Potassium: 4
Sodium: 143

## 2010-12-18 LAB — URINALYSIS, ROUTINE W REFLEX MICROSCOPIC
Bilirubin Urine: NEGATIVE
Bilirubin Urine: NEGATIVE
Glucose, UA: NEGATIVE
Glucose, UA: NEGATIVE
Glucose, UA: 100 — AB
Glucose, UA: NEGATIVE
Hgb urine dipstick: NEGATIVE
Hgb urine dipstick: NEGATIVE
Hgb urine dipstick: NEGATIVE
Ketones, ur: 15 — AB
Ketones, ur: NEGATIVE
Ketones, ur: 15 — AB
Nitrite: NEGATIVE
Nitrite: NEGATIVE
Nitrite: NEGATIVE
Nitrite: NEGATIVE
Protein, ur: NEGATIVE
Protein, ur: NEGATIVE
Protein, ur: NEGATIVE
Protein, ur: NEGATIVE
Specific Gravity, Urine: 1.015
Specific Gravity, Urine: 1.023
Specific Gravity, Urine: 1.041 — ABNORMAL HIGH
Specific Gravity, Urine: 1.033 — ABNORMAL HIGH
Urobilinogen, UA: 0.2
Urobilinogen, UA: 0.2
Urobilinogen, UA: 0.2
Urobilinogen, UA: 1
pH: 5.5
pH: 6.5
pH: 5.5
pH: 6.5

## 2010-12-18 LAB — PROTIME-INR
INR: 1
Prothrombin Time: 13.1

## 2010-12-18 LAB — DIFFERENTIAL
Basophils Absolute: 0
Basophils Absolute: 0.1
Basophils Relative: 1
Basophils Relative: 1
Eosinophils Absolute: 0.1 — ABNORMAL LOW
Eosinophils Absolute: 0 — ABNORMAL LOW
Eosinophils Relative: 1
Eosinophils Relative: 1
Lymphocytes Relative: 40
Lymphocytes Relative: 40
Lymphs Abs: 3.2
Lymphs Abs: 3.3
Monocytes Absolute: 0.5
Monocytes Absolute: 0.4
Monocytes Relative: 6
Monocytes Relative: 5
Neutro Abs: 4.2
Neutro Abs: 4.4
Neutrophils Relative %: 53
Neutrophils Relative %: 54

## 2010-12-18 LAB — CBC
HCT: 32.7 — ABNORMAL LOW
HCT: 33.3 — ABNORMAL LOW
HCT: 33.6 — ABNORMAL LOW
HCT: 37.6
HCT: 34 — ABNORMAL LOW
Hemoglobin: 11.3 — ABNORMAL LOW
Hemoglobin: 11.5 — ABNORMAL LOW
Hemoglobin: 11.6 — ABNORMAL LOW
Hemoglobin: 13.1
Hemoglobin: 11.6 — ABNORMAL LOW
MCHC: 34.4
MCHC: 34.5
MCHC: 34.5
MCHC: 34.8
MCHC: 34.1
MCV: 95
MCV: 95
MCV: 95.8
MCV: 96.1
MCV: 94.9
Platelets: 359
Platelets: 361
Platelets: 383
Platelets: 439 — ABNORMAL HIGH
Platelets: 427 — ABNORMAL HIGH
RBC: 3.44 — ABNORMAL LOW
RBC: 3.46 — ABNORMAL LOW
RBC: 3.51 — ABNORMAL LOW
RBC: 3.96
RBC: 3.58 — ABNORMAL LOW
RDW: 14.3
RDW: 14.4
RDW: 14.6
RDW: 14.7
RDW: 14.3
WBC: 6.2
WBC: 7.3
WBC: 8
WBC: 8.6
WBC: 8.2

## 2010-12-18 LAB — RAPID URINE DRUG SCREEN, HOSP PERFORMED
Amphetamines: NOT DETECTED
Barbiturates: POSITIVE — AB
Benzodiazepines: POSITIVE — AB
Cocaine: NOT DETECTED
Opiates: POSITIVE — AB
Tetrahydrocannabinol: NOT DETECTED

## 2010-12-18 LAB — LIPASE, BLOOD
Lipase: 15
Lipase: 17

## 2010-12-18 LAB — POCT CARDIAC MARKERS
CKMB, poc: <1 — ABNORMAL LOW
Myoglobin, poc: 46.2
Operator id: 4533
Troponin i, poc: <0.05

## 2010-12-18 LAB — URINE MICROSCOPIC-ADD ON

## 2010-12-18 LAB — CULTURE, BLOOD (ROUTINE X 2)
Culture: NO GROWTH
Culture: NO GROWTH

## 2010-12-18 LAB — URINE CULTURE
Colony Count: NO GROWTH
Culture: NO GROWTH

## 2010-12-18 LAB — APTT: aPTT: 34

## 2010-12-18 LAB — B-NATRIURETIC PEPTIDE (CONVERTED LAB): Pro B Natriuretic peptide (BNP): <30

## 2010-12-18 LAB — POTASSIUM: Potassium: 3.2 — ABNORMAL LOW

## 2010-12-18 LAB — HEMOGLOBIN A1C
Hgb A1c MFr Bld: 5.4
Mean Plasma Glucose: 115

## 2010-12-18 LAB — TSH: TSH: 2.029

## 2010-12-18 LAB — OCCULT BLOOD X 1 CARD TO LAB, STOOL: Fecal Occult Bld: POSITIVE

## 2010-12-24 LAB — URINALYSIS, ROUTINE W REFLEX MICROSCOPIC
Glucose, UA: NEGATIVE
Hgb urine dipstick: NEGATIVE
Ketones, ur: 15 — AB
Nitrite: NEGATIVE
Protein, ur: NEGATIVE
Specific Gravity, Urine: >1.046 — ABNORMAL HIGH
Urobilinogen, UA: 0.2
pH: 5

## 2010-12-24 LAB — COMPREHENSIVE METABOLIC PANEL
ALT: 12
AST: 15
Albumin: 3.8
Alkaline Phosphatase: 68
BUN: 15
CO2: 25
Calcium: 9.2
Chloride: 105
Creatinine, Ser: 0.87
GFR calc Af Amer: >60
GFR calc non Af Amer: >60
Glucose, Bld: 90
Potassium: 3.1 — ABNORMAL LOW
Sodium: 136
Total Bilirubin: 0.4
Total Protein: 6.4

## 2010-12-24 LAB — CBC
HCT: 40
Hemoglobin: 13.3
MCHC: 33.3
MCV: 95.1
Platelets: 355
RBC: 4.2
RDW: 13.6
WBC: 11.9 — ABNORMAL HIGH

## 2010-12-24 LAB — DIFFERENTIAL
Basophils Absolute: 0.1
Basophils Relative: 1
Eosinophils Absolute: 0.1
Eosinophils Relative: 1
Lymphocytes Relative: 45
Lymphs Abs: 5.3 — ABNORMAL HIGH
Monocytes Absolute: 0.7
Monocytes Relative: 6
Neutro Abs: 5.8
Neutrophils Relative %: 49

## 2010-12-24 LAB — PREGNANCY, URINE: Preg Test, Ur: NEGATIVE

## 2010-12-24 LAB — LIPASE, BLOOD: Lipase: 18

## 2011-08-06 ENCOUNTER — Other Ambulatory Visit: Payer: Self-pay | Admitting: Physician Assistant

## 2011-08-06 ENCOUNTER — Other Ambulatory Visit: Payer: Self-pay | Admitting: Geriatric Medicine

## 2011-08-06 DIAGNOSIS — N644 Mastodynia: Secondary | ICD-10-CM

## 2011-08-06 DIAGNOSIS — N63 Unspecified lump in unspecified breast: Secondary | ICD-10-CM

## 2011-08-19 ENCOUNTER — Ambulatory Visit
Admission: RE | Admit: 2011-08-19 | Discharge: 2011-08-19 | Disposition: A | Discharge status: Home / Self Care | Payer: Medicare Other | Source: Ambulatory Visit | Attending: Geriatric Medicine | Admitting: Geriatric Medicine

## 2011-08-19 ENCOUNTER — Other Ambulatory Visit: Payer: Self-pay | Admitting: Geriatric Medicine

## 2011-08-19 DIAGNOSIS — N644 Mastodynia: Secondary | ICD-10-CM

## 2011-08-19 DIAGNOSIS — N63 Unspecified lump in unspecified breast: Secondary | ICD-10-CM

## 2012-08-19 ENCOUNTER — Emergency Department (HOSPITAL_COMMUNITY)
Admission: EM | Admit: 2012-08-19 | Discharge: 2012-08-19 | Disposition: A | Discharge status: Home / Self Care | Payer: Medicare Other | Attending: Emergency Medicine | Admitting: Emergency Medicine

## 2012-08-19 ENCOUNTER — Emergency Department (HOSPITAL_COMMUNITY): Payer: Medicare Other

## 2012-08-19 ENCOUNTER — Encounter (HOSPITAL_COMMUNITY): Payer: Self-pay | Admitting: *Deleted

## 2012-08-19 DIAGNOSIS — R109 Unspecified abdominal pain: Secondary | ICD-10-CM

## 2012-08-19 DIAGNOSIS — R112 Nausea with vomiting, unspecified: Secondary | ICD-10-CM

## 2012-08-19 DIAGNOSIS — Z8679 Personal history of other diseases of the circulatory system: Secondary | ICD-10-CM | POA: Insufficient documentation

## 2012-08-19 DIAGNOSIS — R0602 Shortness of breath: Secondary | ICD-10-CM | POA: Insufficient documentation

## 2012-08-19 DIAGNOSIS — F17200 Nicotine dependence, unspecified, uncomplicated: Secondary | ICD-10-CM | POA: Insufficient documentation

## 2012-08-19 DIAGNOSIS — Z79899 Other long term (current) drug therapy: Secondary | ICD-10-CM | POA: Insufficient documentation

## 2012-08-19 DIAGNOSIS — R634 Abnormal weight loss: Secondary | ICD-10-CM | POA: Insufficient documentation

## 2012-08-19 HISTORY — DX: Cardiac arrhythmia, unspecified: I49.9

## 2012-08-19 LAB — CBC WITH DIFFERENTIAL/PLATELET
Basophils Absolute: 0 10*3/uL (ref 0.0–0.1)
Basophils Relative: 0 % (ref 0–1)
Eosinophils Absolute: 0 10*3/uL (ref 0.0–0.7)
Eosinophils Relative: 0 % (ref 0–5)
HCT: 42.4 % (ref 36.0–46.0)
Hemoglobin: 15.1 g/dL — ABNORMAL HIGH (ref 12.0–15.0)
Lymphocytes Relative: 22 % (ref 12–46)
Lymphs Abs: 2.6 10*3/uL (ref 0.7–4.0)
MCH: 33 pg (ref 26.0–34.0)
MCHC: 35.6 g/dL (ref 30.0–36.0)
MCV: 92.8 fL (ref 78.0–100.0)
Monocytes Absolute: 0.7 10*3/uL (ref 0.1–1.0)
Monocytes Relative: 6 % (ref 3–12)
Neutro Abs: 8.4 10*3/uL — ABNORMAL HIGH (ref 1.7–7.7)
Neutrophils Relative %: 71 % (ref 43–77)
Platelets: 480 10*3/uL — ABNORMAL HIGH (ref 150–400)
RBC: 4.57 MIL/uL (ref 3.87–5.11)
RDW: 14.3 % (ref 11.5–15.5)
WBC: 11.7 10*3/uL — ABNORMAL HIGH (ref 4.0–10.5)

## 2012-08-19 LAB — URINE MICROSCOPIC-ADD ON

## 2012-08-19 LAB — URINALYSIS, ROUTINE W REFLEX MICROSCOPIC
Bilirubin Urine: NEGATIVE
Glucose, UA: NEGATIVE mg/dL
Ketones, ur: NEGATIVE mg/dL
Leukocytes, UA: NEGATIVE
Nitrite: NEGATIVE
Protein, ur: NEGATIVE mg/dL
Specific Gravity, Urine: 1.017 (ref 1.005–1.030)
Urobilinogen, UA: 1 mg/dL (ref 0.0–1.0)
pH: 6.5 (ref 5.0–8.0)

## 2012-08-19 LAB — COMPREHENSIVE METABOLIC PANEL
ALT: 17 U/L (ref 0–35)
AST: 18 U/L (ref 0–37)
Albumin: 4.3 g/dL (ref 3.5–5.2)
Alkaline Phosphatase: 76 U/L (ref 39–117)
BUN: 22 mg/dL (ref 6–23)
CO2: 29 mEq/L (ref 19–32)
Calcium: 10.3 mg/dL (ref 8.4–10.5)
Chloride: 97 mEq/L (ref 96–112)
Creatinine, Ser: 0.79 mg/dL (ref 0.50–1.10)
GFR calc Af Amer: >90 mL/min (ref >90)
GFR calc non Af Amer: 89 mL/min — ABNORMAL LOW (ref >90)
Glucose, Bld: 116 mg/dL — ABNORMAL HIGH (ref 70–99)
Potassium: 3.2 mEq/L — ABNORMAL LOW (ref 3.5–5.1)
Sodium: 137 mEq/L (ref 135–145)
Total Bilirubin: 0.4 mg/dL (ref 0.3–1.2)
Total Protein: 8.2 g/dL (ref 6.0–8.3)

## 2012-08-19 LAB — LIPASE, BLOOD: Lipase: 31 U/L (ref 11–59)

## 2012-08-19 MED ORDER — ONDANSETRON HCL 4 MG PO TABS: 4.0000 mg | ORAL_TABLET | Freq: Four times a day (QID) | ORAL | Status: DC

## 2012-08-19 MED ORDER — HYDROCODONE-ACETAMINOPHEN 5-325 MG PO TABS: 2.0000 | ORAL_TABLET | ORAL | Status: DC | PRN

## 2012-08-19 MED ORDER — POTASSIUM CHLORIDE CRYS ER 20 MEQ PO TBCR
40.0000 meq | EXTENDED_RELEASE_TABLET | Freq: Once | ORAL | Status: AC
  Administered 2012-08-19: 40 meq via ORAL
  Filled 2012-08-19: qty 2

## 2012-08-19 MED ORDER — ONDANSETRON HCL 4 MG/2ML IJ SOLN
4.0000 mg | INTRAMUSCULAR | Status: AC
  Administered 2012-08-19: 4 mg via INTRAVENOUS
  Filled 2012-08-19: qty 2

## 2012-08-19 MED ORDER — SODIUM CHLORIDE 0.9 % IV BOLUS (SEPSIS)
1000.0000 mL | Freq: Once | INTRAVENOUS | Status: AC
  Administered 2012-08-19: 1000 mL via INTRAVENOUS

## 2012-08-19 MED ORDER — LORAZEPAM 2 MG/ML IJ SOLN
1.0000 mg | Freq: Once | INTRAMUSCULAR | Status: AC
  Administered 2012-08-19: 1 mg via INTRAVENOUS
  Filled 2012-08-19: qty 1

## 2012-08-19 MED ORDER — PROMETHAZINE HCL 25 MG PO TABS: 25.0000 mg | ORAL_TABLET | Freq: Four times a day (QID) | ORAL | Status: DC | PRN

## 2012-08-19 NOTE — ED Provider Notes (Signed)
History     CSN: [id number]  Arrival date & time 08/19/12  4098   First MD Initiated Contact with Patient 08/19/12 [phone]      Chief Complaint  Patient presents with  . Chest Pain    (Consider location/radiation/quality/duration/timing/severity/associated sxs/prior treatment) HPI Comments: Patient is a 62 year old female with a history of tachyarrhythmia and tobacco use who presents for right-sided chest pain x3 months. Patient states the pain originates in her right axillary region and radiates down the right lateral aspect of her chest to her right upper quadrant. Patient states the symptoms have been gradually worsening over the past 3 months and are aggravated when leaning forward. Patient denies any alleviating factors of her symptoms. She endorses dyspnea and a 15 pound unintentional weight loss over the past 3 months since symptoms began. Patient presents to the ED today because symptoms have now become associated with nausea and vomiting x 3 days. Patient states "I haven't been able to keep anything down". Emesis has been nonbloody and nonbilious without aggravating or alleviating factors. She denies fevers, central chest pain, diarrhea, melena, hematochezia, urinary symptoms, vaginal pain or d/c, and numbness or tingling in her extremities.   Patient states she was seen by a provider at [facility] for symptoms prior to N/V onset who ordered an abdominal U/S and told her "she thought my liver was inflamed". Patient states they treated her with septra for "an infection" and told her her "blood count was elevated". Patient in NAD and nontoxic appearing. Patient currently on methadone.  Patient is a 62 y.o. female presenting with chest pain. The history is provided by the patient. No language interpreter was used.  Chest Pain Associated symptoms: abdominal pain (RUQ), nausea, shortness of breath and vomiting   Associated symptoms: no fever, no numbness and no weakness     Past Medical History   Diagnosis Date  . Arrhythmia     Past Surgical History  Procedure Laterality Date  . Abdominal hysterectomy    . Abdominal surgery    . Breast surgery    . Appendectomy      No family history on file.  History  Substance Use Topics  . Smoking status: Current Every Day Smoker  . Smokeless tobacco: Not on file  . Alcohol Use: No    OB History   Grav Para Term Preterm Abortions TAB SAB Ect Mult Living                  Review of Systems  Constitutional: Negative for fever.  Respiratory: Positive for shortness of breath.   Cardiovascular: Positive for chest pain.  Gastrointestinal: Positive for nausea, vomiting and abdominal pain (RUQ). Negative for diarrhea, constipation and blood in stool.  Genitourinary: Negative for dysuria, hematuria, vaginal discharge and pelvic pain.  Neurological: Negative for syncope, weakness and numbness.  All other systems reviewed and are negative.    Allergies  Codeine and Nsaids  Home Medications   Current Outpatient Rx  Name  Route  Sig  Dispense  Refill  . acetaminophen (TYLENOL) 500 MG tablet   Oral   Take 1,000 mg by mouth 2 (two) times daily.         . carvedilol (COREG) 12.5 MG tablet   Oral   Take 12.5 mg by mouth 2 (two) times daily with a meal.         . methadone (DOLOPHINE) 10 MG/ML solution   Oral   Take 95 mg by mouth daily.         Marland Kitchen  HYDROcodone-acetaminophen (NORCO/VICODIN) 5-325 MG per tablet   Oral   Take 2 tablets by mouth every 4 (four) hours as needed for pain.   10 tablet   0   . promethazine (PHENERGAN) 25 MG tablet   Oral   Take 1 tablet (25 mg total) by mouth every 6 (six) hours as needed for nausea.   12 tablet   0   . sulfamethoxazole-trimethoprim (BACTRIM DS) 800-160 MG per tablet   Oral   Take 1 tablet by mouth 2 (two) times daily. 10 day course. Filled on 08/07/12.           BP 118/69  Pulse 84  Temp(Src) 97.9 F (36.6 C) (Oral)  Resp 18  SpO2 98%  Physical Exam  Nursing  note and vitals reviewed. Constitutional: She is oriented to person, place, and time. She appears well-developed and well-nourished. No distress.  Thin and petite; nontoxic appearing  HENT:  Head: Normocephalic and atraumatic.  Mouth/Throat: Oropharynx is clear and moist. No oropharyngeal exudate.  Eyes: Conjunctivae are normal. Pupils are equal, round, and reactive to light. No scleral icterus.  Neck: Normal range of motion. Neck supple.  Cardiovascular: Normal rate, regular rhythm and intact distal pulses.   1/6 SEM  Pulmonary/Chest: Effort normal. She has wheezes (adventitious sounds with expiratory wheezes diffusely). She has no rales. She exhibits no tenderness.  Abdominal: Soft. Bowel sounds are normal. She exhibits no distension and no mass. There is tenderness (RUQ and epigastric pain). There is no rebound and no guarding.  No peritoneal signs. No hepatosplenomegaly appreciated on exam.  Musculoskeletal: Normal range of motion. She exhibits no edema and no tenderness.  Lymphadenopathy:    She has no cervical adenopathy.  Neurological: She is alert and oriented to person, place, and time.  Skin: Skin is warm and dry. No rash noted. She is not diaphoretic. No erythema. No pallor.  Psychiatric: She has a normal mood and affect. Her behavior is normal.    ED Course  Procedures (including critical care time)  Labs Reviewed  CBC WITH DIFFERENTIAL - Abnormal; Notable for the following:    WBC 11.7 (*)    Hemoglobin 15.1 (*)    Platelets 480 (*)    Neutro Abs 8.4 (*)    All other components within normal limits  COMPREHENSIVE METABOLIC PANEL - Abnormal; Notable for the following:    Potassium 3.2 (*)    Glucose, Bld 116 (*)    GFR calc non Af Amer 89 (*)    All other components within normal limits  URINALYSIS, ROUTINE W REFLEX MICROSCOPIC - Abnormal; Notable for the following:    APPearance HAZY (*)    Hgb urine dipstick SMALL (*)    All other components within normal limits   URINE MICROSCOPIC-ADD ON - Abnormal; Notable for the following:    Squamous Epithelial / LPF MANY (*)    Bacteria, UA MANY (*)    All other components within normal limits  LIPASE, BLOOD   Dg Chest 2 View  08/19/2012   *RADIOLOGY REPORT*  Clinical Data:  shortness of breath.  Vomiting  CHEST - 2 VIEW  Comparison: 04/07/2009  Findings: The heart size and mediastinal contours are within normal limits.  Both lungs are clear.  The visualized skeletal structures are unremarkable.  IMPRESSION: No acute cardiopulmonary abnormalities.   Original Report Authenticated By: Signa Kell, M.D.     Date: 08/19/2012  Rate: 94  Rhythm: normal sinus rhythm  QRS Axis: normal  Intervals: normal  ST/T Wave abnormalities: nonspecific ST changes  Conduction Disutrbances:right bundle branch block (incomplete)  Narrative Interpretation: NSR with incomplete RBBB; no STEMI  Old EKG Reviewed: unchanged from 10/26/10 I have personally reviewed and interpreted this EKG   1. Abdominal pain   2. Nausea and vomiting in adult     MDM  Right axillary chest wall pain radiating to right upper quadrant x3 months with associated nausea and NB/NB x3 days. Physical exam significant for focal tenderness in the right upper quadrant and epigastric region without peritoneal signs. Initial workup to include CBC, CMP, lipase, UA, and chest x-ray. EKG without ST elevation or ischemic changes. Low suspicion for ACS given right-sided nature of symptoms, symptom duration, and physical exam findings; TIMI score between 0-1 correlated with 5% risk of ACS. Low suspicion for PE given lack of tachycardia, tachypnea, or hypoxia; lungs CTAB and Well's PE score 0. Zofran ordered for nausea.  Patient's left significant for mild leukocytosis of 11.7. No anemia or gross collection of abnormalities. Liver and kidney function preserved. No evidence of pneumonia, pneumothorax, pleural effusion, or other infiltrate on my interpretation of the  patient's chest x-ray. No worsening abdominal tenderness on reexamination. Patient had previous abdominal ultrasound done on 08/10/12 for symptoms that was negative for any acute changes. Given workup today and imaging results do not believe further imaging today is warranted. Patient appropriate for discharge with primary care followup for further evaluation of symptoms. Patient given prescription for Norco and Phenergan. Indications for ED return provided. Patient seen also by Dr. Hyacinth Meeker who is in agreement with this work up and management plan.        Antony Madura, PA-C 08/19/12 1201

## 2012-08-19 NOTE — ED Notes (Signed)
PT is here with chest pain intermittent over the last 3 months and now worse down sides, right arm and chest.  Pt reports sob.  Pt is here with vomiting for the last three days.

## 2012-08-19 NOTE — ED Provider Notes (Signed)
Medical screening examination/treatment/procedure(s) were conducted as a shared visit with non-physician practitioner(s) and myself.  I personally evaluated the patient during the encounter   Pt is well appearing and on my exam has reproducible ttp over the R chest wasll and abd / flank wall.  No edema, clear heart and lungs ounds and unremarkable labss - outpt [place] report obtained showing no acute findings relating to the RUQ - pt is well appaering and stable for f/u in outpt setting.  Vida Roller, MD 08/19/12 2006

## 2012-08-19 NOTE — ED Notes (Signed)
Pt up ambulatory to attempt to provide an urine specimen; family at bedside

## 2012-08-19 NOTE — ED Notes (Signed)
Right upper quadrant pain x 6 months, began in arm pit and moved to upper right quadrant. Reports weight loss of 15lb in last 3 months. Unable to eat x 3 days due to nausea and vomiting.

## 2012-09-01 ENCOUNTER — Other Ambulatory Visit (INDEPENDENT_AMBULATORY_CARE_PROVIDER_SITE_OTHER): Payer: Self-pay | Admitting: Surgery

## 2012-09-01 ENCOUNTER — Ambulatory Visit (INDEPENDENT_AMBULATORY_CARE_PROVIDER_SITE_OTHER): Payer: Medicare Other | Admitting: Surgery

## 2012-09-01 ENCOUNTER — Encounter (INDEPENDENT_AMBULATORY_CARE_PROVIDER_SITE_OTHER): Payer: Self-pay | Admitting: Surgery

## 2012-09-01 VITALS — BP 142/70 | HR 72 | Temp 98.9°F | Resp 16 | Ht 65.0 in | Wt 120.6 lb

## 2012-09-01 DIAGNOSIS — R1011 Right upper quadrant pain: Secondary | ICD-10-CM

## 2012-09-01 DIAGNOSIS — K8020 Calculus of gallbladder without cholecystitis without obstruction: Secondary | ICD-10-CM

## 2012-09-01 NOTE — Progress Notes (Signed)
Patient ID: [name], female   DOB: 27-Nov-1950, 62 y.o.   MRN: [id number]  Chief Complaint  Patient presents with  . New Evaluation    eval GB    HPI [name] is a 62 y.o. female.  Referred by Dr. Hyacinth Meeker at [facility] ER for evaluation of possible gallbladder disease.  HPI This is a 62 year old female who presents with several months of right-sided chest and abdominal pain radiating around to her back. The symptoms have become worse. She has some associated nausea and vomiting. Her appetite is poor. The pain seems to get worse after she eats. She has had some weight loss due to her poor appetite. She had an ultrasound performed on 08/10/12 at [facility] that reports only "gallbladder - normal".  There is no description of wall thickness or surrounding fluid. There is no description of a sonographic Murphy's sign. Despite her negative workup she was referred for surgical evaluation.   Past Medical History  Diagnosis Date  . Arrhythmia   . Arthritis   . GERD (gastroesophageal reflux disease)   . Heart murmur   Chronic back pain Narcotic dependence due to chronic back pain  Past Surgical History  Procedure Laterality Date  . Abdominal hysterectomy    . Abdominal surgery    . Breast surgery    . Appendectomy    . Ovarian cyst surgery      Family History  Problem Relation Age of Onset  . Alzheimer's disease Mother   . Cancer Father     prostate  . Heart disease Father     Social History History  Substance Use Topics  . Smoking status: Current Every Day Smoker  . Smokeless tobacco: Never Used  . Alcohol Use: No    Allergies  Allergen Reactions  . Codeine Nausea And Vomiting  . Nsaids Other (See Comments)    Stomach ulcers    Current Outpatient Prescriptions  Medication Sig Dispense Refill  . carvedilol (COREG) 12.5 MG tablet Take 12.5 mg by mouth 2 (two) times daily with a meal.      . methadone (DOLOPHINE) 10 MG/ML solution Take 95 mg by mouth daily.       Marland Kitchen acetaminophen (TYLENOL) 500 MG tablet Take 1,000 mg by mouth 2 (two) times daily.      . [DISCONTINUED] promethazine (PHENERGAN) 25 MG tablet Take 1 tablet (25 mg total) by mouth every 6 (six) hours as needed for nausea.  12 tablet  0   No current facility-administered medications for this visit.    Review of Systems Review of Systems  Constitutional: Negative for fever, chills and unexpected weight change.  HENT: Negative for hearing loss, congestion, sore throat, trouble swallowing and voice change.   Eyes: Negative for visual disturbance.  Respiratory: Negative for cough and wheezing.   Cardiovascular: Negative for chest pain, palpitations and leg swelling.  Gastrointestinal: Positive for nausea, vomiting and abdominal pain. Negative for diarrhea, constipation, blood in stool, abdominal distention and anal bleeding.  Genitourinary: Negative for hematuria, vaginal bleeding and difficulty urinating.  Musculoskeletal: Negative for arthralgias.  Skin: Negative for rash and wound.  Neurological: Negative for seizures, syncope and headaches.  Hematological: Negative for adenopathy. Does not bruise/bleed easily.  Psychiatric/Behavioral: Negative for confusion.    Blood pressure 142/70, pulse 72, temperature 98.9 F (37.2 C), temperature source Oral, resp. rate 16, height 5\' 5"  (1.651 m), weight 120 lb 9.6 oz (54.704 kg).  Physical Exam Physical Exam WDWN in NAD HEENT:  EOMI, sclera  anicteric Neck:  No masses, no thyromegaly Lungs:  CTA bilaterally; normal respiratory effort CV:  Regular rate and rhythm; no murmurs Abd:  +bowel sounds, soft, mildly tender in RUQ; no palpable masses Ext:  Well-perfused; no edema Skin:  Warm, dry; no sign of jaundice  Data Reviewed Lab Results  Component Value Date   WBC 11.7* 08/19/2012   HGB 15.1* 08/19/2012   HCT 42.4 08/19/2012   MCV 92.8 08/19/2012   PLT 480* 08/19/2012   Lab Results  Component Value Date   CREATININE 0.79 08/19/2012   BUN  22 08/19/2012   NA 137 08/19/2012   K 3.2* 08/19/2012   CL 97 08/19/2012   CO2 29 08/19/2012   Lab Results  Component Value Date   ALT 17 08/19/2012   AST 18 08/19/2012   ALKPHOS 76 08/19/2012   BILITOT 0.4 08/19/2012   Lab Results  Component Value Date   LIPASE 31 08/19/2012   Ultrasound - as above  Assessment    RUQ pain - probable biliary colic;      Plan    HIDA scan with EF to rule out biliary dyskinesia.  We will meet with the patient after her studies are completed. If she continues to have these symptoms and her high skin is negative then we will decide whether she wants to proceed with elective surgery.        Ayub Kirsh K. 09/01/2012, 1:07 PM

## 2012-09-03 ENCOUNTER — Encounter (INDEPENDENT_AMBULATORY_CARE_PROVIDER_SITE_OTHER): Payer: Self-pay

## 2012-09-07 ENCOUNTER — Encounter (HOSPITAL_COMMUNITY)
Admission: RE | Admit: 2012-09-07 | Discharge: 2012-09-07 | Disposition: A | Discharge status: Home / Self Care | Payer: Medicare Other | Source: Ambulatory Visit | Attending: Surgery | Admitting: Surgery

## 2012-09-07 DIAGNOSIS — K8020 Calculus of gallbladder without cholecystitis without obstruction: Secondary | ICD-10-CM

## 2012-09-07 DIAGNOSIS — R1011 Right upper quadrant pain: Secondary | ICD-10-CM

## 2012-09-07 MED ORDER — TECHNETIUM TC 99M MEBROFENIN IV KIT
5.5000 | PACK | Freq: Once | INTRAVENOUS | Status: AC | PRN
  Administered 2012-09-07: 6 via INTRAVENOUS

## 2012-09-14 ENCOUNTER — Encounter (INDEPENDENT_AMBULATORY_CARE_PROVIDER_SITE_OTHER): Payer: Self-pay | Admitting: Surgery

## 2012-09-14 ENCOUNTER — Ambulatory Visit (INDEPENDENT_AMBULATORY_CARE_PROVIDER_SITE_OTHER): Payer: Medicare Other | Admitting: Surgery

## 2012-09-14 VITALS — BP 130/80 | HR 68 | Temp 97.6°F | Resp 15 | Ht 65.0 in | Wt 121.6 lb

## 2012-09-14 DIAGNOSIS — R1011 Right upper quadrant pain: Secondary | ICD-10-CM

## 2012-09-14 NOTE — Progress Notes (Signed)
09/14/12 The patient continues to have problems with nausea and right-sided abdominal pain radiating around to her back. This continues to worsen after she eats. She continues to have fairly poor appetite. Her recent hydroscan did show a normal ejection fraction of 75%. However the radiologist did state that subjectively, the degree of contraction appear slightly less. She did have symptoms with oral intake of Ensure.  We then spent some time discussing her options for treatment. We could refer her to a gastroenterologist for further workup. She has had previous ulcer disease and has had a previous workup by Dr. Claudette Head. She states that this pain is considerably different than her previous ulcer pain.  The other option would be to proceed with laparoscopic cholecystectomy with the understanding that it may not be the cure for her pain. However due to her constellation of symptoms and findings I do think that she would likely benefit from cholecystectomy. The patient and her husband have made the decision to proceed with elective laparoscopic cholecystectomy.  The surgical procedure has been discussed with the patient.  Potential risks, benefits, alternative treatments, and expected outcomes have been explained.  All of the patient's questions at this time have been answered.  The likelihood of reaching the patient's treatment goal is good.  The patient understand the proposed surgical procedure and wishes to proceed.  Wilmon Arms. Corliss Skains, MD, [facility] Surgery  General/ Trauma Surgery  09/14/2012 5:07 PM    Old note 09/01/12  HPI  [name] is a 62 y.o. female. Referred by Dr. Hyacinth Meeker at [facility] ER for evaluation of possible gallbladder disease.  HPI  This is a 62 year old female who presents with several months of right-sided chest and abdominal pain radiating around to her back. The symptoms have become worse. She has some associated nausea and vomiting. Her appetite is poor.  The pain seems to get worse after she eats. She has had some weight loss due to her poor appetite. She had an ultrasound performed on 08/10/12 at [facility] that reports only "gallbladder - normal". There is no description of wall thickness or surrounding fluid. There is no description of a sonographic Murphy's sign. Despite her negative workup she was referred for surgical evaluation.  Past Medical History   Diagnosis  Date   .  Arrhythmia    .  Arthritis    .  GERD (gastroesophageal reflux disease)    .  Heart murmur    Chronic back pain  Narcotic dependence due to chronic back pain  Past Surgical History   Procedure  Laterality  Date   .  Abdominal hysterectomy     .  Abdominal surgery     .  Breast surgery     .  Appendectomy     .  Ovarian cyst surgery      Family History   Problem  Relation  Age of Onset   .  Alzheimer's disease  Mother    .  Cancer  Father      prostate   .  Heart disease  Father    Social History  History   Substance Use Topics   .  Smoking status:  Current Every Day Smoker   .  Smokeless tobacco:  Never Used   .  Alcohol Use:  No    Allergies   Allergen  Reactions   .  Codeine  Nausea And Vomiting   .  Nsaids  Other (See Comments)  Stomach ulcers    Current Outpatient Prescriptions   Medication  Sig  Dispense  Refill   .  carvedilol (COREG) 12.5 MG tablet  Take 12.5 mg by mouth 2 (two) times daily with a meal.     .  methadone (DOLOPHINE) 10 MG/ML solution  Take 95 mg by mouth daily.     Marland Kitchen  acetaminophen (TYLENOL) 500 MG tablet  Take 1,000 mg by mouth 2 (two) times daily.     .  [DISCONTINUED] promethazine (PHENERGAN) 25 MG tablet  Take 1 tablet (25 mg total) by mouth every 6 (six) hours as needed for nausea.  12 tablet  0    No current facility-administered medications for this visit.   Review of Systems  Review of Systems  Constitutional: Negative for fever, chills and unexpected weight change.  HENT: Negative for hearing loss,  congestion, sore throat, trouble swallowing and voice change.  Eyes: Negative for visual disturbance.  Respiratory: Negative for cough and wheezing.  Cardiovascular: Negative for chest pain, palpitations and leg swelling.  Gastrointestinal: Positive for nausea, vomiting and abdominal pain. Negative for diarrhea, constipation, blood in stool, abdominal distention and anal bleeding.  Genitourinary: Negative for hematuria, vaginal bleeding and difficulty urinating.  Musculoskeletal: Negative for arthralgias.  Skin: Negative for rash and wound.  Neurological: Negative for seizures, syncope and headaches.  Hematological: Negative for adenopathy. Does not bruise/bleed easily.  Psychiatric/Behavioral: Negative for confusion.  Blood pressure 142/70, pulse 72, temperature 98.9 F (37.2 C), temperature source Oral, resp. rate 16, height 5\' 5"  (1.651 m), weight 120 lb 9.6 oz (54.704 kg).  Physical Exam  Physical Exam  WDWN in NAD  HEENT: EOMI, sclera anicteric  Neck: No masses, no thyromegaly  Lungs: CTA bilaterally; normal respiratory effort  CV: Regular rate and rhythm; no murmurs  Abd: +bowel sounds, soft, mildly tender in RUQ; no palpable masses  Ext: Well-perfused; no edema  Skin: Warm, dry; no sign of jaundice  Data Reviewed  Lab Results   Component  Value  Date    WBC  11.7*  08/19/2012    HGB  15.1*  08/19/2012    HCT  42.4  08/19/2012    MCV  92.8  08/19/2012    PLT  480*  08/19/2012    Lab Results   Component  Value  Date    CREATININE  0.79  08/19/2012    BUN  22  08/19/2012    NA  137  08/19/2012    K  3.2*  08/19/2012    CL  97  08/19/2012    CO2  29  08/19/2012    Lab Results   Component  Value  Date    ALT  17  08/19/2012    AST  18  08/19/2012    ALKPHOS  76  08/19/2012    BILITOT  0.4  08/19/2012    Lab Results   Component  Value  Date    LIPASE  31  08/19/2012   Ultrasound - as above  Assessment  RUQ pain - probable biliary colic;  Plan  HIDA scan with EF to rule out  biliary dyskinesia. We will meet with the patient after her studies are completed. If she continues to have these symptoms and her HIDA scan is negative then we will decide whether she wants to proceed with elective surgery.  [name] K.  09/01/2012, 1:07 PM

## 2012-09-24 ENCOUNTER — Encounter (HOSPITAL_COMMUNITY): Payer: Self-pay

## 2012-09-24 ENCOUNTER — Encounter (HOSPITAL_COMMUNITY)
Admission: RE | Admit: 2012-09-24 | Discharge: 2012-09-24 | Disposition: A | Discharge status: Home / Self Care | Payer: Medicare Other | Source: Ambulatory Visit | Attending: Surgery | Admitting: Surgery

## 2012-09-24 ENCOUNTER — Encounter (HOSPITAL_COMMUNITY): Payer: Self-pay | Admitting: Pharmacy Technician

## 2012-09-24 HISTORY — DX: Essential (primary) hypertension: I10

## 2012-09-24 HISTORY — DX: Nausea with vomiting, unspecified: R11.2

## 2012-09-24 HISTORY — DX: Other specified postprocedural states: Z98.890

## 2012-09-24 HISTORY — DX: Other psychoactive substance abuse, in remission: F19.11

## 2012-09-24 HISTORY — DX: Fibromyalgia: M79.7

## 2012-09-24 LAB — CBC
HCT: 37.6 % (ref 36.0–46.0)
Hemoglobin: 12.7 g/dL (ref 12.0–15.0)
MCH: 32 pg (ref 26.0–34.0)
MCHC: 33.8 g/dL (ref 30.0–36.0)
MCV: 94.7 fL (ref 78.0–100.0)
Platelets: 303 10*3/uL (ref 150–400)
RBC: 3.97 MIL/uL (ref 3.87–5.11)
RDW: 13.5 % (ref 11.5–15.5)
WBC: 8.9 10*3/uL (ref 4.0–10.5)

## 2012-09-24 LAB — BASIC METABOLIC PANEL
BUN: 18 mg/dL (ref 6–23)
CO2: 30 mEq/L (ref 19–32)
Calcium: 9.9 mg/dL (ref 8.4–10.5)
Chloride: 102 mEq/L (ref 96–112)
Creatinine, Ser: 0.8 mg/dL (ref 0.50–1.10)
GFR calc Af Amer: >90 mL/min (ref >90)
GFR calc non Af Amer: 79 mL/min — ABNORMAL LOW (ref >90)
Glucose, Bld: 101 mg/dL — ABNORMAL HIGH (ref 70–99)
Potassium: 4.3 mEq/L (ref 3.5–5.1)
Sodium: 139 mEq/L (ref 135–145)

## 2012-09-24 NOTE — Progress Notes (Signed)
Chest x ray, EKG 6/14 EPIC 

## 2012-09-24 NOTE — Patient Instructions (Addendum)
20 Lailanie Hasley  09/24/2012   Your procedure is scheduled on: 09/30/12  [facility]   Report to [facility] Stay Center at 1000      AM.  Call this number if you have problems the morning of surgery: [phone]       Remember:   Do not eat food  Or drink :After Midnight. Tuesday NIGHT   Take these medicines the morning of surgery with A SIP OF WATER:Carvedilol, Methadone   .  Contacts, dentures or partial plates can not be worn to surgery  Leave suitcase in the car. After surgery it may be brought to your room.  For patients admitted to the hospital, checkout time is 11:00 AM day of  discharge.             SPECIAL INSTRUCTIONS- SEE [place] PREPARING FOR SURGERY INSTRUCTION SHEET-     DO NOT WEAR JEWELRY, LOTIONS, POWDERS, OR PERFUMES.  WOMEN-- DO NOT SHAVE LEGS OR UNDERARMS FOR 12 HOURS BEFORE SHOWERS. MEN MAY SHAVE FACE.  Patients discharged the day of surgery will not be allowed to drive home. IF going home the day of surgery, you must have a driver and someone to stay with you for the first 24 hours  Name and phone number of your driver: Aurther Loft  husband                                                                                                                                                       Jimel Myler  PST [phone]                 FAILURE TO FOLLOW THESE INSTRUCTIONS MAY RESULT IN  CANCELLATION   OF YOUR SURGERY                                                  Patient Signature _____________________________

## 2012-09-30 ENCOUNTER — Telehealth (INDEPENDENT_AMBULATORY_CARE_PROVIDER_SITE_OTHER): Payer: Self-pay | Admitting: General Surgery

## 2012-09-30 ENCOUNTER — Ambulatory Visit (HOSPITAL_COMMUNITY)
Admission: RE | Admit: 2012-09-30 | Discharge: 2012-09-30 | Disposition: A | Discharge status: Home / Self Care | Payer: Medicare Other | Source: Ambulatory Visit | Attending: Surgery | Admitting: Surgery

## 2012-09-30 ENCOUNTER — Ambulatory Visit (HOSPITAL_COMMUNITY): Payer: Medicare Other

## 2012-09-30 ENCOUNTER — Encounter (HOSPITAL_COMMUNITY): Payer: Self-pay | Admitting: Anesthesiology

## 2012-09-30 ENCOUNTER — Encounter (HOSPITAL_COMMUNITY)
Admission: RE | Disposition: A | Discharge status: Home / Self Care | Payer: Self-pay | Source: Ambulatory Visit | Attending: Surgery

## 2012-09-30 ENCOUNTER — Other Ambulatory Visit (INDEPENDENT_AMBULATORY_CARE_PROVIDER_SITE_OTHER): Payer: Self-pay | Admitting: Surgery

## 2012-09-30 ENCOUNTER — Encounter (HOSPITAL_COMMUNITY): Payer: Self-pay | Admitting: *Deleted

## 2012-09-30 ENCOUNTER — Ambulatory Visit (HOSPITAL_COMMUNITY): Payer: Medicare Other | Admitting: Anesthesiology

## 2012-09-30 DIAGNOSIS — M549 Dorsalgia, unspecified: Secondary | ICD-10-CM | POA: Insufficient documentation

## 2012-09-30 DIAGNOSIS — Z886 Allergy status to analgesic agent status: Secondary | ICD-10-CM | POA: Insufficient documentation

## 2012-09-30 DIAGNOSIS — K811 Chronic cholecystitis: Secondary | ICD-10-CM | POA: Insufficient documentation

## 2012-09-30 DIAGNOSIS — Z885 Allergy status to narcotic agent status: Secondary | ICD-10-CM | POA: Insufficient documentation

## 2012-09-30 DIAGNOSIS — R011 Cardiac murmur, unspecified: Secondary | ICD-10-CM | POA: Insufficient documentation

## 2012-09-30 DIAGNOSIS — G8929 Other chronic pain: Secondary | ICD-10-CM | POA: Insufficient documentation

## 2012-09-30 DIAGNOSIS — Z79899 Other long term (current) drug therapy: Secondary | ICD-10-CM | POA: Insufficient documentation

## 2012-09-30 DIAGNOSIS — I499 Cardiac arrhythmia, unspecified: Secondary | ICD-10-CM | POA: Insufficient documentation

## 2012-09-30 DIAGNOSIS — F17200 Nicotine dependence, unspecified, uncomplicated: Secondary | ICD-10-CM | POA: Insufficient documentation

## 2012-09-30 DIAGNOSIS — M129 Arthropathy, unspecified: Secondary | ICD-10-CM | POA: Insufficient documentation

## 2012-09-30 DIAGNOSIS — K219 Gastro-esophageal reflux disease without esophagitis: Secondary | ICD-10-CM | POA: Insufficient documentation

## 2012-09-30 DIAGNOSIS — Z9049 Acquired absence of other specified parts of digestive tract: Secondary | ICD-10-CM

## 2012-09-30 DIAGNOSIS — R1011 Right upper quadrant pain: Secondary | ICD-10-CM

## 2012-09-30 DIAGNOSIS — Z791 Long term (current) use of non-steroidal anti-inflammatories (NSAID): Secondary | ICD-10-CM | POA: Insufficient documentation

## 2012-09-30 HISTORY — PX: CHOLECYSTECTOMY: SHX55

## 2012-09-30 SURGERY — LAPAROSCOPIC CHOLECYSTECTOMY WITH INTRAOPERATIVE CHOLANGIOGRAM
Anesthesia: General | Site: Abdomen | Wound class: Clean Contaminated

## 2012-09-30 MED ORDER — PROMETHAZINE HCL 25 MG/ML IJ SOLN
6.2500 mg | INTRAMUSCULAR | Status: AC | PRN
  Administered 2012-09-30 (×2): 12.5 mg via INTRAVENOUS

## 2012-09-30 MED ORDER — PROPOFOL 10 MG/ML IV BOLUS
INTRAVENOUS | Status: DC | PRN
  Administered 2012-09-30: 150 mg via INTRAVENOUS

## 2012-09-30 MED ORDER — PROMETHAZINE HCL 12.5 MG PO TABS: 12.5000 mg | ORAL_TABLET | Freq: Four times a day (QID) | ORAL | Status: DC | PRN

## 2012-09-30 MED ORDER — SODIUM CHLORIDE 0.9 % IR SOLN
Status: DC | PRN
  Administered 2012-09-30: 5.5 mL

## 2012-09-30 MED ORDER — SUCCINYLCHOLINE CHLORIDE 20 MG/ML IJ SOLN
INTRAMUSCULAR | Status: DC | PRN
  Administered 2012-09-30: 100 mg via INTRAVENOUS

## 2012-09-30 MED ORDER — KETOROLAC TROMETHAMINE 30 MG/ML IJ SOLN
INTRAMUSCULAR | Status: AC
  Filled 2012-09-30: qty 1

## 2012-09-30 MED ORDER — HYDROMORPHONE HCL PF 1 MG/ML IJ SOLN
0.2500 mg | INTRAMUSCULAR | Status: DC | PRN
  Administered 2012-09-30 (×2): 0.5 mg via INTRAVENOUS

## 2012-09-30 MED ORDER — HYDROCODONE-ACETAMINOPHEN 5-325 MG PO TABS: 1.0000 | ORAL_TABLET | ORAL | Status: DC | PRN

## 2012-09-30 MED ORDER — HYDROCODONE-ACETAMINOPHEN 5-325 MG PO TABS
1.0000 | ORAL_TABLET | ORAL | Status: DC | PRN
  Administered 2012-09-30: 2 via ORAL
  Filled 2012-09-30: qty 1
  Filled 2012-09-30: qty 2

## 2012-09-30 MED ORDER — ONDANSETRON HCL 4 MG/2ML IJ SOLN: 4.0000 mg | INTRAMUSCULAR | Status: DC | PRN

## 2012-09-30 MED ORDER — ACETAMINOPHEN 10 MG/ML IV SOLN
1000.0000 mg | Freq: Four times a day (QID) | INTRAVENOUS | Status: DC
  Filled 2012-09-30: qty 100

## 2012-09-30 MED ORDER — IOHEXOL 300 MG/ML  SOLN
INTRAMUSCULAR | Status: AC
  Filled 2012-09-30: qty 1

## 2012-09-30 MED ORDER — MIDAZOLAM HCL 2 MG/2ML IJ SOLN
INTRAMUSCULAR | Status: AC
  Filled 2012-09-30: qty 2

## 2012-09-30 MED ORDER — LACTATED RINGERS IV SOLN
INTRAVENOUS | Status: DC
  Administered 2012-09-30: 1000 mL via INTRAVENOUS

## 2012-09-30 MED ORDER — CISATRACURIUM BESYLATE (PF) 10 MG/5ML IV SOLN
INTRAVENOUS | Status: DC | PRN
  Administered 2012-09-30: 6 mg via INTRAVENOUS

## 2012-09-30 MED ORDER — PROMETHAZINE HCL 25 MG/ML IJ SOLN
INTRAMUSCULAR | Status: AC
  Filled 2012-09-30: qty 1

## 2012-09-30 MED ORDER — BUPIVACAINE-EPINEPHRINE 0.25% -1:200000 IJ SOLN
INTRAMUSCULAR | Status: AC
  Filled 2012-09-30: qty 1

## 2012-09-30 MED ORDER — MIDAZOLAM HCL 5 MG/5ML IJ SOLN
INTRAMUSCULAR | Status: DC | PRN
  Administered 2012-09-30: 1 mg via INTRAVENOUS

## 2012-09-30 MED ORDER — IOHEXOL 300 MG/ML  SOLN
INTRAMUSCULAR | Status: DC | PRN
  Administered 2012-09-30: 5.5 mL via INTRAVENOUS

## 2012-09-30 MED ORDER — CEFAZOLIN SODIUM-DEXTROSE 2-3 GM-% IV SOLR
INTRAVENOUS | Status: AC
  Filled 2012-09-30: qty 50

## 2012-09-30 MED ORDER — MIDAZOLAM HCL 2 MG/2ML IJ SOLN
1.0000 mg | INTRAMUSCULAR | Status: DC
  Administered 2012-09-30: 1 mg via INTRAVENOUS

## 2012-09-30 MED ORDER — NEOSTIGMINE METHYLSULFATE 1 MG/ML IJ SOLN
INTRAMUSCULAR | Status: DC | PRN
  Administered 2012-09-30: 3 mg via INTRAVENOUS

## 2012-09-30 MED ORDER — KETOROLAC TROMETHAMINE 30 MG/ML IJ SOLN
15.0000 mg | Freq: Once | INTRAMUSCULAR | Status: AC | PRN
  Administered 2012-09-30: 30 mg via INTRAVENOUS

## 2012-09-30 MED ORDER — HYDROMORPHONE HCL PF 1 MG/ML IJ SOLN
1.0000 mg | INTRAMUSCULAR | Status: DC | PRN
  Administered 2012-09-30: 1 mg via INTRAVENOUS
  Filled 2012-09-30: qty 1

## 2012-09-30 MED ORDER — CEFAZOLIN SODIUM-DEXTROSE 2-3 GM-% IV SOLR
2.0000 g | INTRAVENOUS | Status: AC
  Administered 2012-09-30: 2 g via INTRAVENOUS

## 2012-09-30 MED ORDER — ACETAMINOPHEN 10 MG/ML IV SOLN
INTRAVENOUS | Status: DC | PRN
  Administered 2012-09-30: 1000 mg via INTRAVENOUS

## 2012-09-30 MED ORDER — FENTANYL CITRATE 0.05 MG/ML IJ SOLN
INTRAMUSCULAR | Status: DC | PRN
  Administered 2012-09-30: 25 ug via INTRAVENOUS
  Administered 2012-09-30: 50 ug via INTRAVENOUS
  Administered 2012-09-30: 25 ug via INTRAVENOUS

## 2012-09-30 MED ORDER — CHLORHEXIDINE GLUCONATE 4 % EX LIQD
1.0000 | Freq: Once | CUTANEOUS | Status: DC
Start: 2012-09-30 — End: 2012-09-30
  Filled 2012-09-30: qty 15

## 2012-09-30 MED ORDER — LACTATED RINGERS IV SOLN
INTRAVENOUS | Status: DC | PRN
  Administered 2012-09-30: 11:00:00 via INTRAVENOUS

## 2012-09-30 MED ORDER — ONDANSETRON HCL 4 MG/2ML IJ SOLN
INTRAMUSCULAR | Status: DC | PRN
  Administered 2012-09-30 (×2): 2 mg via INTRAVENOUS

## 2012-09-30 MED ORDER — BUPIVACAINE-EPINEPHRINE 0.25% -1:200000 IJ SOLN
INTRAMUSCULAR | Status: DC | PRN
  Administered 2012-09-30: 20 mL

## 2012-09-30 MED ORDER — GLYCOPYRROLATE 0.2 MG/ML IJ SOLN
INTRAMUSCULAR | Status: DC | PRN
  Administered 2012-09-30: 0.4 mg via INTRAVENOUS

## 2012-09-30 MED ORDER — KETAMINE HCL 10 MG/ML IJ SOLN
INTRAMUSCULAR | Status: DC | PRN
  Administered 2012-09-30 (×2): 5 mg via INTRAVENOUS
  Administered 2012-09-30: 10 mg via INTRAVENOUS

## 2012-09-30 MED ORDER — HYDROMORPHONE HCL PF 1 MG/ML IJ SOLN
INTRAMUSCULAR | Status: AC
  Filled 2012-09-30: qty 1

## 2012-09-30 SURGICAL SUPPLY — 38 items
APPLIER CLIP ROT 10 11.4 M/L (STAPLE) ×2
BENZOIN TINCTURE PRP APPL 2/3 (GAUZE/BANDAGES/DRESSINGS) ×2 IMPLANT
CANISTER SUCTION 2500CC (MISCELLANEOUS) ×2 IMPLANT
CHLORAPREP W/TINT 26ML (MISCELLANEOUS) ×2 IMPLANT
CLIP APPLIE ROT 10 11.4 M/L (STAPLE) ×1 IMPLANT
CLOTH BEACON ORANGE TIMEOUT ST (SAFETY) ×2 IMPLANT
COVER MAYO STAND STRL (DRAPES) ×2 IMPLANT
DECANTER SPIKE VIAL GLASS SM (MISCELLANEOUS) ×2 IMPLANT
DRAPE C-ARM 42X120 X-RAY (DRAPES) ×2 IMPLANT
DRAPE LAPAROSCOPIC ABDOMINAL (DRAPES) ×2 IMPLANT
DRAPE UTILITY XL STRL (DRAPES) ×2 IMPLANT
DRSG TEGADERM 2-3/8X2-3/4 SM (GAUZE/BANDAGES/DRESSINGS) ×6 IMPLANT
DRSG TEGADERM 4X4.75 (GAUZE/BANDAGES/DRESSINGS) ×2 IMPLANT
ELECT REM PT RETURN 9FT ADLT (ELECTROSURGICAL) ×2
ELECTRODE REM PT RTRN 9FT ADLT (ELECTROSURGICAL) ×1 IMPLANT
FILTER SMOKE EVAC LAPAROSHD (FILTER) ×2 IMPLANT
GLOVE BIO SURGEON STRL SZ7 (GLOVE) ×2 IMPLANT
GLOVE BIOGEL PI IND STRL 7.0 (GLOVE) ×1 IMPLANT
GLOVE BIOGEL PI IND STRL 7.5 (GLOVE) ×1 IMPLANT
GLOVE BIOGEL PI INDICATOR 7.0 (GLOVE) ×1
GLOVE BIOGEL PI INDICATOR 7.5 (GLOVE) ×1
GOWN STRL NON-REIN LRG LVL3 (GOWN DISPOSABLE) ×4 IMPLANT
GOWN STRL REIN XL XLG (GOWN DISPOSABLE) ×2 IMPLANT
KIT BASIN OR (CUSTOM PROCEDURE TRAY) ×2 IMPLANT
NS IRRIG 1000ML POUR BTL (IV SOLUTION) ×2 IMPLANT
POUCH SPECIMEN RETRIEVAL 10MM (ENDOMECHANICALS) ×2 IMPLANT
RINGERS IRRIG 1000ML POUR BTL (IV SOLUTION) ×2 IMPLANT
SET CHOLANGIOGRAPH MIX (MISCELLANEOUS) ×2 IMPLANT
SET IRRIG TUBING LAPAROSCOPIC (IRRIGATION / IRRIGATOR) ×2 IMPLANT
SOLUTION ANTI FOG 6CC (MISCELLANEOUS) ×2 IMPLANT
STRIP CLOSURE SKIN 1/2X4 (GAUZE/BANDAGES/DRESSINGS) ×2 IMPLANT
SUT MNCRL AB 4-0 PS2 18 (SUTURE) ×2 IMPLANT
TOWEL OR 17X26 10 PK STRL BLUE (TOWEL DISPOSABLE) ×2 IMPLANT
TRAY LAP CHOLE (CUSTOM PROCEDURE TRAY) ×2 IMPLANT
TROCAR BLADELESS OPT 5 75 (ENDOMECHANICALS) ×4 IMPLANT
TROCAR XCEL BLUNT TIP 100MML (ENDOMECHANICALS) ×2 IMPLANT
TROCAR XCEL NON-BLD 11X100MML (ENDOMECHANICALS) ×2 IMPLANT
TUBING INSUFFLATION 10FT LAP (TUBING) ×2 IMPLANT

## 2012-09-30 NOTE — Anesthesia Postprocedure Evaluation (Signed)
  Anesthesia Post-op Note  Patient: [name]  Procedure(s) Performed: Procedure(s) (LRB): LAPAROSCOPIC CHOLECYSTECTOMY WITH INTRAOPERATIVE CHOLANGIOGRAM (N/A)  Patient Location: PACU  Anesthesia Type: General  Level of Consciousness: awake and alert   Airway and Oxygen Therapy: Patient Spontanous Breathing  Post-op Pain: mild  Post-op Assessment: Post-op Vital signs reviewed, Patient's Cardiovascular Status Stable, Respiratory Function Stable, Patent Airway and No signs of Nausea or vomiting  Last Vitals:  Filed Vitals:   09/30/12 1400  BP: 115/57  Pulse: 62  Temp:   Resp: 14    Post-op Vital Signs: stable   Complications: No apparent anesthesia complications

## 2012-09-30 NOTE — H&P (View-Only) (Signed)
Patient ID: [name], female   DOB: [birth year], 62 y.o.   MRN: [id number]  Chief Complaint  Patient presents with  . New Evaluation    eval GB    HPI [name] is a 62 y.o. female.  Referred by Dr. Hyacinth Meeker at [facility] ER for evaluation of possible gallbladder disease.  HPI This is a 62 year old female who presents with several months of right-sided chest and abdominal pain radiating around to her back. The symptoms have become worse. She has some associated nausea and vomiting. Her appetite is poor. The pain seems to get worse after she eats. She has had some weight loss due to her poor appetite. She had an ultrasound performed on 08/10/12 at [facility] that reports only "gallbladder - normal".  There is no description of wall thickness or surrounding fluid. There is no description of a sonographic Murphy's sign. Despite her negative workup she was referred for surgical evaluation.   Past Medical History  Diagnosis Date  . Arrhythmia   . Arthritis   . GERD (gastroesophageal reflux disease)   . Heart murmur   Chronic back pain Narcotic dependence due to chronic back pain  Past Surgical History  Procedure Laterality Date  . Abdominal hysterectomy    . Abdominal surgery    . Breast surgery    . Appendectomy    . Ovarian cyst surgery      Family History  Problem Relation Age of Onset  . Alzheimer's disease Mother   . Cancer Father     prostate  . Heart disease Father     Social History History  Substance Use Topics  . Smoking status: Current Every Day Smoker  . Smokeless tobacco: Never Used  . Alcohol Use: No    Allergies  Allergen Reactions  . Codeine Nausea And Vomiting  . Nsaids Other (See Comments)    Stomach ulcers    Current Outpatient Prescriptions  Medication Sig Dispense Refill  . carvedilol (COREG) 12.5 MG tablet Take 12.5 mg by mouth 2 (two) times daily with a meal.      . methadone (DOLOPHINE) 10 MG/ML solution Take 95 mg by mouth daily.       Marland Kitchen acetaminophen (TYLENOL) 500 MG tablet Take 1,000 mg by mouth 2 (two) times daily.      . [DISCONTINUED] promethazine (PHENERGAN) 25 MG tablet Take 1 tablet (25 mg total) by mouth every 6 (six) hours as needed for nausea.  12 tablet  0   No current facility-administered medications for this visit.    Review of Systems Review of Systems  Constitutional: Negative for fever, chills and unexpected weight change.  HENT: Negative for hearing loss, congestion, sore throat, trouble swallowing and voice change.   Eyes: Negative for visual disturbance.  Respiratory: Negative for cough and wheezing.   Cardiovascular: Negative for chest pain, palpitations and leg swelling.  Gastrointestinal: Positive for nausea, vomiting and abdominal pain. Negative for diarrhea, constipation, blood in stool, abdominal distention and anal bleeding.  Genitourinary: Negative for hematuria, vaginal bleeding and difficulty urinating.  Musculoskeletal: Negative for arthralgias.  Skin: Negative for rash and wound.  Neurological: Negative for seizures, syncope and headaches.  Hematological: Negative for adenopathy. Does not bruise/bleed easily.  Psychiatric/Behavioral: Negative for confusion.    Blood pressure 142/70, pulse 72, temperature 98.9 F (37.2 C), temperature source Oral, resp. rate 16, height 5\' 5"  (1.651 m), weight 120 lb 9.6 oz (54.704 kg).  Physical Exam Physical Exam WDWN in NAD HEENT:  EOMI, sclera  anicteric Neck:  No masses, no thyromegaly Lungs:  CTA bilaterally; normal respiratory effort CV:  Regular rate and rhythm; no murmurs Abd:  +bowel sounds, soft, mildly tender in RUQ; no palpable masses Ext:  Well-perfused; no edema Skin:  Warm, dry; no sign of jaundice  Data Reviewed Lab Results  Component Value Date   WBC 11.7* 08/19/2012   HGB 15.1* 08/19/2012   HCT 42.4 08/19/2012   MCV 92.8 08/19/2012   PLT 480* 08/19/2012   Lab Results  Component Value Date   CREATININE 0.79 08/19/2012   BUN  22 08/19/2012   NA 137 08/19/2012   K 3.2* 08/19/2012   CL 97 08/19/2012   CO2 29 08/19/2012   Lab Results  Component Value Date   ALT 17 08/19/2012   AST 18 08/19/2012   ALKPHOS 76 08/19/2012   BILITOT 0.4 08/19/2012   Lab Results  Component Value Date   LIPASE 31 08/19/2012   Ultrasound - as above  Assessment    RUQ pain - probable biliary colic;      Plan    HIDA scan with EF to rule out biliary dyskinesia.  We will meet with the patient after her studies are completed. If she continues to have these symptoms and her high skin is negative then we will decide whether she wants to proceed with elective surgery.        [name] 09/01/2012, 1:07 PM

## 2012-09-30 NOTE — Transfer of Care (Signed)
Immediate Anesthesia Transfer of Care Note  Patient: [name]  Procedure(s) Performed: Procedure(s): LAPAROSCOPIC CHOLECYSTECTOMY WITH INTRAOPERATIVE CHOLANGIOGRAM (N/A)  Patient Location: PACU  Anesthesia Type:General  Level of Consciousness: awake, alert  and oriented  Airway & Oxygen Therapy: Patient Spontanous Breathing and Patient connected to face mask oxygen  Post-op Assessment: Report given to PACU RN and Post -op Vital signs reviewed and stable  Post vital signs: stable  Complications: No apparent anesthesia complications

## 2012-09-30 NOTE — Op Note (Signed)
Laparoscopic Cholecystectomy with IOC Procedure Note  Indications: This patient presents with persistent RUQ pain and nausea.  Ultrasound and HIDA scan were normal, but due to her continued symptoms, we discussed laparoscopic cholecystectomy.  She has consented to the procedure.  Pre-operative Diagnosis: Abdominal pain, right upper quadrant  Post-operative Diagnosis: Chronic cholecystitis  Surgeon: Henley Blyth K.   Assistants: none  Anesthesia: General endotracheal anesthesia  ASA Class: 2  Procedure Details  The patient was seen again in the Holding Room. The risks, benefits, complications, treatment options, and expected outcomes were discussed with the patient. The possibilities of reaction to medication, pulmonary aspiration, perforation of viscus, bleeding, recurrent infection, finding a normal gallbladder, the need for additional procedures, failure to diagnose a condition, the possible need to convert to an open procedure, and creating a complication requiring transfusion or operation were discussed with the patient. The likelihood of improving the patient's symptoms with return to their baseline status is good.  The patient and/or family concurred with the proposed plan, giving informed consent. The site of surgery properly noted. The patient was taken to Operating Room, identified as [name] and the procedure verified as Laparoscopic Cholecystectomy with Intraoperative Cholangiogram. A Time Out was held and the above information confirmed.  Prior to the induction of general anesthesia, antibiotic prophylaxis was administered. General endotracheal anesthesia was then administered and tolerated well. After the induction, the abdomen was prepped with Chloraprep and draped in the sterile fashion. The patient was positioned in the supine position.  Local anesthetic agent was injected into the skin near the umbilicus and an incision made. We dissected down to the abdominal fascia with  blunt dissection.  The fascia was incised vertically and we entered the peritoneal cavity bluntly.  A pursestring suture of 0-Vicryl was placed around the fascial opening.  The Hasson cannula was inserted and secured with the stay suture.  Pneumoperitoneum was then created with CO2 and tolerated well without any adverse changes in the patient's vital signs. An 11-mm port was placed in the subxiphoid position.  Two 5-mm ports were placed in the right upper quadrant. All skin incisions were infiltrated with a local anesthetic agent before making the incision and placing the trocars.   We positioned the patient in reverse Trendelenburg, tilted slightly to the patient's left.  The gallbladder was identified and was quite large and mildly thickened.  The fundus was grasped and retracted cephalad. Adhesions were lysed bluntly and with the electrocautery where indicated, taking care not to injure any adjacent organs or viscus. The infundibulum was grasped and retracted laterally, exposing the peritoneum overlying the triangle of Calot. This was then divided and exposed in a blunt fashion. A critical view of the cystic duct and cystic artery was obtained.  The cystic duct was clearly identified and bluntly dissected circumferentially. The cystic duct was ligated with a clip distally.   An incision was made in the cystic duct and the [facility] cholangiogram catheter introduced. The catheter was secured using a clip. A cholangiogram was then obtained which showed good visualization of the distal and proximal biliary tree with no sign of filling defects or obstruction.  The cystic duct was very serpentine and long.  Contrast flowed easily into the duodenum. The catheter was then removed.   The cystic duct was then ligated with clips and divided. The cystic artery was identified, dissected free, ligated with clips and divided as well.   The gallbladder was dissected from the liver bed in retrograde fashion with the  electrocautery. The gallbladder was removed and placed in an Endocatch sac. The liver bed was irrigated and inspected. Hemostasis was achieved with the electrocautery. Copious irrigation was utilized and was repeatedly aspirated until clear.  The gallbladder and Endocatch sac were then removed through the umbilical port site.  The pursestring suture was used to close the umbilical fascia.    We again inspected the right upper quadrant for hemostasis.  Pneumoperitoneum was released as we removed the trocars.  4-0 Monocryl was used to close the skin.   Benzoin, steri-strips, and clean dressings were applied. The patient was then extubated and brought to the recovery room in stable condition. Instrument, sponge, and needle counts were correct at closure and at the conclusion of the case.   Findings: Cholecystitis without Cholelithiasis  Estimated Blood Loss: Minimal         Drains: none         Specimens: Gallbladder           Complications: None; patient tolerated the procedure well.         Disposition: PACU - hemodynamically stable.         Condition: stable  Wilmon Arms. Corliss Skains, MD, [facility]  General/ Trauma Surgery  09/30/2012 12:55 PM

## 2012-09-30 NOTE — Telephone Encounter (Signed)
Called in Vicodin per protocol and phenergan 12.5 mg po q 6 prn called in to the [facility] [phone] and spoke to [facility]

## 2012-09-30 NOTE — Progress Notes (Signed)
Spoke to Dr. Corliss Skains about pt prescriptions.  He states he will have the pain med (hydrocodone) and phenergan called into the pt's pharmacy.  Informed him it was [facility]  He voiced understanding.

## 2012-09-30 NOTE — Anesthesia Preprocedure Evaluation (Signed)
Anesthesia Evaluation  Patient identified by MRN, date of birth, ID band Patient awake    Reviewed: Allergy & Precautions, H&P , NPO status , Patient's Chart, lab work & pertinent test results  Airway Mallampati: II TM Distance: >3 FB Neck ROM: Full    Dental no notable dental hx.    Pulmonary asthma ,  breath sounds clear to auscultation  Pulmonary exam normal       Cardiovascular hypertension, Pt. on medications Rhythm:Regular Rate:Normal     Neuro/Psych negative neurological ROS  negative psych ROS   GI/Hepatic GERD-  ,(+)     substance abuse   ,  methadone (DOLOPHINE) 10 MG/ML solution   Taking 09/14/12 1628        Endo/Other  negative endocrine ROS  Renal/GU negative Renal ROS  negative genitourinary   Musculoskeletal negative musculoskeletal ROS (+)   Abdominal   Peds negative pediatric ROS (+)  Hematology negative hematology ROS (+)   Anesthesia Other Findings   Reproductive/Obstetrics negative OB ROS                           Anesthesia Physical Anesthesia Plan  ASA: II  Anesthesia Plan: General   Post-op Pain Management:    Induction: Intravenous  Airway Management Planned: Oral ETT  Additional Equipment:   Intra-op Plan:   Post-operative Plan: Extubation in OR  Informed Consent: I have reviewed the patients History and Physical, chart, labs and discussed the procedure including the risks, benefits and alternatives for the proposed anesthesia with the patient or authorized representative who has indicated his/her understanding and acceptance.   Dental advisory given  Plan Discussed with: CRNA and Surgeon  Anesthesia Plan Comments:         Anesthesia Quick Evaluation

## 2012-09-30 NOTE — Interval H&P Note (Signed)
History and Physical Interval Note: 09/14/12  The patient continues to have problems with nausea and right-sided abdominal pain radiating around to her back. This continues to worsen after she eats. She continues to have fairly poor appetite. Her recent hydroscan did show a normal ejection fraction of 75%. However the radiologist did state that subjectively, the degree of contraction appear slightly less. She did have symptoms with oral intake of Ensure.  We then spent some time discussing her options for treatment. We could refer her to a gastroenterologist for further workup. She has had previous ulcer disease and has had a previous workup by Dr. Claudette Head. She states that this pain is considerably different than her previous ulcer pain. The other option would be to proceed with laparoscopic cholecystectomy with the understanding that it may not be the cure for her pain. However due to her constellation of symptoms and findings I do think that she would likely benefit from cholecystectomy. The patient and her husband have made the decision to proceed with elective laparoscopic cholecystectomy.  The surgical procedure has been discussed with the patient. Potential risks, benefits, alternative treatments, and expected outcomes have been explained. All of the patient's questions at this time have been answered. The likelihood of reaching the patient's treatment goal is good. The patient understand the proposed surgical procedure and wishes to proceed.     09/30/2012 11:23 AM  [name]  has presented today for surgery, with the diagnosis of chronic cholecystitis  The various methods of treatment have been discussed with the patient and family. After consideration of risks, benefits and other options for treatment, the patient has consented to  Procedure(s): LAPAROSCOPIC CHOLECYSTECTOMY WITH INTRAOPERATIVE CHOLANGIOGRAM (N/A) as a surgical intervention .  The patient's history has been reviewed, patient  examined, no change in status, stable for surgery.  I have reviewed the patient's chart and labs.  Questions were answered to the patient's satisfaction.     [name] K.

## 2012-10-01 ENCOUNTER — Encounter (HOSPITAL_COMMUNITY): Payer: Self-pay | Admitting: Surgery

## 2012-10-22 ENCOUNTER — Encounter (INDEPENDENT_AMBULATORY_CARE_PROVIDER_SITE_OTHER): Payer: Medicare Other | Admitting: Surgery

## 2012-10-28 ENCOUNTER — Encounter (INDEPENDENT_AMBULATORY_CARE_PROVIDER_SITE_OTHER): Payer: Self-pay | Admitting: Surgery

## 2013-05-03 ENCOUNTER — Telehealth (INDEPENDENT_AMBULATORY_CARE_PROVIDER_SITE_OTHER): Payer: Self-pay | Admitting: General Surgery

## 2013-05-03 NOTE — Telephone Encounter (Signed)
Pt's husband called to let [facility] know that his wife is still having symptoms like she had before her GB surgery.  Husband states Dr. Georgette Dover told them that surgery to remove her GB may not resolve her pain, but she elected to have surgery anyway.  Pt is still having pani and vomiting with increasing frequency and ferocity.  They have lost confidence in their PCP, calling him a "bandaid doctor."  Strongly recommended they change physicians and have her evaluated as soon a possible to determine the source of her pain.  He will call tomorrow to try to get her in to someone new.

## 2013-05-26 ENCOUNTER — Emergency Department (HOSPITAL_COMMUNITY)
Admission: EM | Admit: 2013-05-26 | Discharge: 2013-05-26 | Disposition: A | Discharge status: Home / Self Care | Payer: Medicare Other | Attending: Emergency Medicine | Admitting: Emergency Medicine

## 2013-05-26 ENCOUNTER — Encounter (HOSPITAL_COMMUNITY): Payer: Self-pay | Admitting: Emergency Medicine

## 2013-05-26 ENCOUNTER — Emergency Department (HOSPITAL_COMMUNITY): Payer: Medicare Other

## 2013-05-26 DIAGNOSIS — R109 Unspecified abdominal pain: Secondary | ICD-10-CM | POA: Insufficient documentation

## 2013-05-26 DIAGNOSIS — Z90710 Acquired absence of both cervix and uterus: Secondary | ICD-10-CM | POA: Insufficient documentation

## 2013-05-26 DIAGNOSIS — R011 Cardiac murmur, unspecified: Secondary | ICD-10-CM | POA: Insufficient documentation

## 2013-05-26 DIAGNOSIS — Z9889 Other specified postprocedural states: Secondary | ICD-10-CM | POA: Insufficient documentation

## 2013-05-26 DIAGNOSIS — M129 Arthropathy, unspecified: Secondary | ICD-10-CM | POA: Insufficient documentation

## 2013-05-26 DIAGNOSIS — G8929 Other chronic pain: Secondary | ICD-10-CM | POA: Insufficient documentation

## 2013-05-26 DIAGNOSIS — M549 Dorsalgia, unspecified: Secondary | ICD-10-CM | POA: Insufficient documentation

## 2013-05-26 DIAGNOSIS — IMO0001 Reserved for inherently not codable concepts without codable children: Secondary | ICD-10-CM | POA: Insufficient documentation

## 2013-05-26 DIAGNOSIS — Z8719 Personal history of other diseases of the digestive system: Secondary | ICD-10-CM | POA: Insufficient documentation

## 2013-05-26 DIAGNOSIS — M79609 Pain in unspecified limb: Secondary | ICD-10-CM | POA: Insufficient documentation

## 2013-05-26 DIAGNOSIS — Z9089 Acquired absence of other organs: Secondary | ICD-10-CM | POA: Insufficient documentation

## 2013-05-26 DIAGNOSIS — Z79899 Other long term (current) drug therapy: Secondary | ICD-10-CM | POA: Insufficient documentation

## 2013-05-26 DIAGNOSIS — R0781 Pleurodynia: Secondary | ICD-10-CM

## 2013-05-26 DIAGNOSIS — F17200 Nicotine dependence, unspecified, uncomplicated: Secondary | ICD-10-CM | POA: Insufficient documentation

## 2013-05-26 DIAGNOSIS — I10 Essential (primary) hypertension: Secondary | ICD-10-CM | POA: Insufficient documentation

## 2013-05-26 DIAGNOSIS — R079 Chest pain, unspecified: Secondary | ICD-10-CM | POA: Insufficient documentation

## 2013-05-26 DIAGNOSIS — N644 Mastodynia: Secondary | ICD-10-CM | POA: Insufficient documentation

## 2013-05-26 LAB — CBC WITH DIFFERENTIAL/PLATELET
Basophils Absolute: 0 10*3/uL (ref 0.0–0.1)
Basophils Relative: 0 % (ref 0–1)
Eosinophils Absolute: 0.1 10*3/uL (ref 0.0–0.7)
Eosinophils Relative: 1 % (ref 0–5)
HCT: 37.1 % (ref 36.0–46.0)
Hemoglobin: 12.8 g/dL (ref 12.0–15.0)
Lymphocytes Relative: 35 % (ref 12–46)
Lymphs Abs: 4.1 10*3/uL — ABNORMAL HIGH (ref 0.7–4.0)
MCH: 32.1 pg (ref 26.0–34.0)
MCHC: 34.5 g/dL (ref 30.0–36.0)
MCV: 93 fL (ref 78.0–100.0)
Monocytes Absolute: 0.8 10*3/uL (ref 0.1–1.0)
Monocytes Relative: 7 % (ref 3–12)
Neutro Abs: 6.7 10*3/uL (ref 1.7–7.7)
Neutrophils Relative %: 57 % (ref 43–77)
Platelets: 371 10*3/uL (ref 150–400)
RBC: 3.99 MIL/uL (ref 3.87–5.11)
RDW: 13.7 % (ref 11.5–15.5)
WBC: 11.8 10*3/uL — ABNORMAL HIGH (ref 4.0–10.5)

## 2013-05-26 LAB — URINALYSIS, ROUTINE W REFLEX MICROSCOPIC
Bilirubin Urine: NEGATIVE
Glucose, UA: NEGATIVE mg/dL
Ketones, ur: NEGATIVE mg/dL
Nitrite: NEGATIVE
Protein, ur: NEGATIVE mg/dL
Specific Gravity, Urine: 1.005 (ref 1.005–1.030)
Urobilinogen, UA: 0.2 mg/dL (ref 0.0–1.0)
pH: 6 (ref 5.0–8.0)

## 2013-05-26 LAB — I-STAT TROPONIN, ED: Troponin i, poc: 0.01 ng/mL (ref 0.00–0.08)

## 2013-05-26 LAB — COMPREHENSIVE METABOLIC PANEL
ALT: 11 U/L (ref 0–35)
AST: 17 U/L (ref 0–37)
Albumin: 3.8 g/dL (ref 3.5–5.2)
Alkaline Phosphatase: 84 U/L (ref 39–117)
BUN: 17 mg/dL (ref 6–23)
CO2: 30 mEq/L (ref 19–32)
Calcium: 9.4 mg/dL (ref 8.4–10.5)
Chloride: 97 mEq/L (ref 96–112)
Creatinine, Ser: 0.88 mg/dL (ref 0.50–1.10)
GFR calc Af Amer: 81 mL/min — ABNORMAL LOW (ref >90)
GFR calc non Af Amer: 69 mL/min — ABNORMAL LOW (ref >90)
Glucose, Bld: 102 mg/dL — ABNORMAL HIGH (ref 70–99)
Potassium: 3.4 mEq/L — ABNORMAL LOW (ref 3.7–5.3)
Sodium: 139 mEq/L (ref 137–147)
Total Bilirubin: <0.2 mg/dL — ABNORMAL LOW (ref 0.3–1.2)
Total Protein: 7.3 g/dL (ref 6.0–8.3)

## 2013-05-26 LAB — URINE MICROSCOPIC-ADD ON

## 2013-05-26 LAB — LIPASE, BLOOD: Lipase: 22 U/L (ref 11–59)

## 2013-05-26 LAB — D-DIMER, QUANTITATIVE: D-Dimer, Quant: 0.39 ug/mL-FEU (ref 0.00–0.48)

## 2013-05-26 MED ORDER — OXYCODONE-ACETAMINOPHEN 5-325 MG PO TABS
1.0000 | ORAL_TABLET | Freq: Once | ORAL | Status: AC
  Administered 2013-05-26: 1 via ORAL
  Filled 2013-05-26: qty 1

## 2013-05-26 MED ORDER — HYDROCODONE-ACETAMINOPHEN 5-325 MG PO TABS: 1.0000 | ORAL_TABLET | ORAL | Status: DC | PRN

## 2013-05-26 NOTE — ED Provider Notes (Signed)
CSN: [id number]     Arrival date & time 05/26/13  1628 History   First MD Initiated Contact with Patient 05/26/13 1734     Chief Complaint  Patient presents with  . underarm pain   . right side pain   . Abdominal Pain  . Back Pain  . Chest Pain     (Consider location/radiation/quality/duration/timing/severity/associated sxs/prior Treatment) HPI [name] is a 63 y.o. female who presents to emergency department complaining of pain to the  Right axilla, right chest, right breast, right flank. She states her symptoms began around 9 months ago. States that she was seen here and by her primary care Dr. that time. Was told it could be her gallbladder. She had a cholecystectomy in September of 2014, states pain just worsened since then. Patient states that she has constant pain but it does worsen at times. It is not dependent on movement, deep breathing, exertion. She states she is short of breath. She states she feels generally weak and is losing weight. She denies any injuries.  Past Medical History  Diagnosis Date  . Arrhythmia   . Arthritis   . GERD (gastroesophageal reflux disease)   . Heart murmur   . PONV (postoperative nausea and vomiting)   . Hypertension   . Fibromyalgia   . H/O: substance abuse 2005    narcotic usage due to chronic back pain   Past Surgical History  Procedure Laterality Date  . Abdominal hysterectomy    . Abdominal surgery    . Breast surgery    . Appendectomy    . Ovarian cyst surgery    . Fracture surgery      right upper arm  . Cholecystectomy N/A 09/30/2012    Procedure: LAPAROSCOPIC CHOLECYSTECTOMY WITH INTRAOPERATIVE CHOLANGIOGRAM;  Surgeon: Imogene Burn. Georgette Dover, MD;  Location: [facility] ORS;  Service: General;  Laterality: N/A;   Family History  Problem Relation Age of Onset  . Alzheimer's disease Mother   . Cancer Father     prostate  . Heart disease Father    History  Substance Use Topics  . Smoking status: Current Every Day Smoker -- 1.00  packs/day for 30 years    Types: Cigarettes  . Smokeless tobacco: Never Used  . Alcohol Use: No   OB History   Grav Para Term Preterm Abortions TAB SAB Ect Mult Living                 Review of Systems  Constitutional: Negative for fever and chills.  Respiratory: Negative for cough, chest tightness and shortness of breath.   Cardiovascular: Positive for chest pain. Negative for palpitations and leg swelling.  Gastrointestinal: Positive for abdominal pain. Negative for nausea, vomiting and diarrhea.  Genitourinary: Positive for flank pain. Negative for dysuria, frequency, hematuria and pelvic pain.  Musculoskeletal: Negative for arthralgias.  Skin: Negative for rash.  Neurological: Negative for dizziness, weakness and headaches.  All other systems reviewed and are negative.      Allergies  Codeine and Nsaids  Home Medications   Current Outpatient Rx  Name  Route  Sig  Dispense  Refill  . acetaminophen (TYLENOL) 500 MG tablet   Oral   Take 1,000 mg by mouth 2 (two) times daily.         . carvedilol (COREG) 12.5 MG tablet   Oral   Take 12.5 mg by mouth 2 (two) times daily with a meal.         . diphenhydrAMINE (BENADRYL) 25 MG tablet  Oral   Take 25 mg by mouth every 6 (six) hours as needed for allergies.         . methadone (DOLOPHINE) 10 MG/ML solution   Oral   Take 95 mg by mouth daily.          BP 106/73  Pulse 80  Temp(Src) 98.3 F (36.8 C) (Oral)  Resp 18  SpO2 100% Physical Exam  Nursing note and vitals reviewed. Constitutional: She appears well-developed and well-nourished. No distress.  HENT:  Head: Normocephalic.  Eyes: Conjunctivae are normal.  Neck: Neck supple.  Cardiovascular: Normal rate, regular rhythm and normal heart sounds.   Pulmonary/Chest: Effort normal and breath sounds normal. No respiratory distress. She has no wheezes. She has no rales. She exhibits tenderness.  Tender over right ribs in midaxillary line  Abdominal:  Soft. Bowel sounds are normal. She exhibits no distension and no mass. There is no tenderness. There is no rebound and no guarding.  Right CVA tenderness, right upper quadrant tenderness.  Musculoskeletal: She exhibits no edema.  Neurological: She is alert.  Skin: Skin is warm and dry.  Psychiatric: She has a normal mood and affect. Her behavior is normal.    ED Course  Procedures (including critical care time) Labs Review Labs Reviewed  CBC WITH DIFFERENTIAL - Abnormal; Notable for the following:    WBC 11.8 (*)    Lymphs Abs 4.1 (*)    All other components within normal limits  COMPREHENSIVE METABOLIC PANEL - Abnormal; Notable for the following:    Potassium 3.4 (*)    Glucose, Bld 102 (*)    Total Bilirubin <0.2 (*)    GFR calc non Af Amer 69 (*)    GFR calc Af Amer 81 (*)    All other components within normal limits  LIPASE, BLOOD  URINALYSIS, ROUTINE W REFLEX MICROSCOPIC  D-DIMER, QUANTITATIVE  I-STAT TROPOININ, ED   Imaging Review Dg Chest 2 View  05/26/2013   CLINICAL DATA:  Right chest pain.  Back pain. Shortness of breath.  EXAM: CHEST  2 VIEW  COMPARISON:  DG CHEST 2 VIEW dated 08/19/2012; CT ABD/PELV WO CM dated 10/18/2010  FINDINGS: Mild biapical pleural parenchymal scarring. Cardiac and mediastinal margins appear normal. The lungs appear otherwise clear. No pleural effusion noted.  IMPRESSION: 1. No significant abnormality is observed.   Electronically Signed   By: Sherryl Barters M.D.   On: 05/26/2013 18:29     EKG Interpretation None      MDM   Final diagnoses:  Right flank pain  Rib pain on right side    Pt with chronic pain to the right chest/ribs, right breast, right flank, right upper abdomen. Pain for almost a year. Tender to palpation. Already had extensive evaluation including cholecystectomy. Will check labs, CXR, d dimer, ua  8:13 PM Labs, d dimer, ua all negative. Pt in NAD. VS normal. Stable for further outpatient follow up. Question whether  pain is radicular from a herniated disk. At this time, no neuro deficits. Will follow up with PCP for further evaluation. norco for pain.   Filed Vitals:   05/26/13 1707  BP: 106/73  Pulse: 80  Temp: 98.3 F (36.8 C)  Resp: 18       Emunah Texidor A Curry Seefeldt, PA-C 05/26/13 2014

## 2013-05-26 NOTE — ED Notes (Signed)
Pt states that she "has had steady pain that sometimes will take you to your knees" under her right arm, down right side, abd pain right breast/chest and back pain that started back in September last year and was told it was her gallbladder and had that removed in September last year as well but pain hasnt gone away and gotten to intense today that came to be seen.

## 2013-05-26 NOTE — Discharge Instructions (Signed)
Take pain medications as prescribed. I wonder if your pain may be caused by a herniated disk in your back, which is pinching on a nerve. Please follow up with a primary care doctor for possible outpatient MRI. Return if symptoms are worsening.   Radicular Pain Radicular pain in either the arm or leg is usually from a bulging or herniated disk in the spine. A piece of the herniated disk may press against the nerves as the nerves exit the spine. This causes pain which is felt at the tips of the nerves down the arm or leg. Other causes of radicular pain may include:  Fractures.  Heart disease.  Cancer.  An abnormal and usually degenerative state of the nervous system or nerves (neuropathy). Diagnosis may require CT or MRI scanning to determine the primary cause.  Nerves that start at the neck (nerve roots) may cause radicular pain in the outer shoulder and arm. It can spread down to the thumb and fingers. The symptoms vary depending on which nerve root has been affected. In most cases radicular pain improves with conservative treatment. Neck problems may require physical therapy, a neck collar, or cervical traction. Treatment may take many weeks, and surgery may be considered if the symptoms do not improve.  Conservative treatment is also recommended for sciatica. Sciatica causes pain to radiate from the lower back or buttock area down the leg into the foot. Often there is a history of back problems. Most patients with sciatica are better after 2 to 4 weeks of rest and other supportive care. Short term bed rest can reduce the disk pressure considerably. Sitting, however, is not a good position since this increases the pressure on the disk. You should avoid bending, lifting, and all other activities which make the problem worse. Traction can be used in severe cases. Surgery is usually reserved for patients who do not improve within the first months of treatment. Only take over-the-counter or prescription  medicines for pain, discomfort, or fever as directed by your caregiver. Narcotics and muscle relaxants may help by relieving more severe pain and spasm and by providing mild sedation. Cold or massage can give significant relief. Spinal manipulation is not recommended. It can increase the degree of disc protrusion. Epidural steroid injections are often effective treatment for radicular pain. These injections deliver medicine to the spinal nerve in the space between the protective covering of the spinal cord and back bones (vertebrae). Your caregiver can give you more information about steroid injections. These injections are most effective when given within two weeks of the onset of pain.  You should see your caregiver for follow up care as recommended. A program for neck and back injury rehabilitation with stretching and strengthening exercises is an important part of management.  SEEK IMMEDIATE MEDICAL CARE IF:  You develop increased pain, weakness, or numbness in your arm or leg.  You develop difficulty with bladder or bowel control.  You develop abdominal pain. Document Released: 04/04/2004 Document Revised: 05/20/2011 Document Reviewed: 06/20/2008 [facility] Patient Information 2014 [place], [place].

## 2013-05-28 NOTE — ED Provider Notes (Signed)
Medical screening examination/treatment/procedure(s) were performed by non-physician practitioner and as supervising physician I was immediately available for consultation/collaboration.   EKG Interpretation None        Alfonzo Feller, DO 05/28/13 1429

## 2014-08-30 ENCOUNTER — Emergency Department (HOSPITAL_COMMUNITY): Payer: Medicare Other

## 2014-08-30 ENCOUNTER — Encounter (HOSPITAL_COMMUNITY): Payer: Self-pay

## 2014-08-30 ENCOUNTER — Emergency Department (HOSPITAL_COMMUNITY)
Admission: EM | Admit: 2014-08-30 | Discharge: 2014-08-30 | Disposition: A | Discharge status: Home / Self Care | Payer: Medicare Other | Attending: Emergency Medicine | Admitting: Emergency Medicine

## 2014-08-30 DIAGNOSIS — R630 Anorexia: Secondary | ICD-10-CM | POA: Diagnosis not present

## 2014-08-30 DIAGNOSIS — Z720 Tobacco use: Secondary | ICD-10-CM | POA: Diagnosis not present

## 2014-08-30 DIAGNOSIS — R011 Cardiac murmur, unspecified: Secondary | ICD-10-CM | POA: Diagnosis not present

## 2014-08-30 DIAGNOSIS — R079 Chest pain, unspecified: Secondary | ICD-10-CM | POA: Insufficient documentation

## 2014-08-30 DIAGNOSIS — R531 Weakness: Secondary | ICD-10-CM | POA: Diagnosis not present

## 2014-08-30 DIAGNOSIS — Z90710 Acquired absence of both cervix and uterus: Secondary | ICD-10-CM | POA: Insufficient documentation

## 2014-08-30 DIAGNOSIS — M1990 Unspecified osteoarthritis, unspecified site: Secondary | ICD-10-CM | POA: Diagnosis not present

## 2014-08-30 DIAGNOSIS — J069 Acute upper respiratory infection, unspecified: Secondary | ICD-10-CM | POA: Insufficient documentation

## 2014-08-30 DIAGNOSIS — I10 Essential (primary) hypertension: Secondary | ICD-10-CM | POA: Insufficient documentation

## 2014-08-30 DIAGNOSIS — R112 Nausea with vomiting, unspecified: Secondary | ICD-10-CM | POA: Diagnosis not present

## 2014-08-30 DIAGNOSIS — Z79899 Other long term (current) drug therapy: Secondary | ICD-10-CM | POA: Diagnosis not present

## 2014-08-30 DIAGNOSIS — Z9089 Acquired absence of other organs: Secondary | ICD-10-CM | POA: Insufficient documentation

## 2014-08-30 DIAGNOSIS — R5383 Other fatigue: Secondary | ICD-10-CM | POA: Diagnosis not present

## 2014-08-30 DIAGNOSIS — G8929 Other chronic pain: Secondary | ICD-10-CM | POA: Diagnosis not present

## 2014-08-30 DIAGNOSIS — M79601 Pain in right arm: Secondary | ICD-10-CM | POA: Insufficient documentation

## 2014-08-30 DIAGNOSIS — R1013 Epigastric pain: Secondary | ICD-10-CM | POA: Insufficient documentation

## 2014-08-30 DIAGNOSIS — R1110 Vomiting, unspecified: Secondary | ICD-10-CM | POA: Diagnosis not present

## 2014-08-30 DIAGNOSIS — Z9049 Acquired absence of other specified parts of digestive tract: Secondary | ICD-10-CM | POA: Insufficient documentation

## 2014-08-30 DIAGNOSIS — Z8719 Personal history of other diseases of the digestive system: Secondary | ICD-10-CM | POA: Diagnosis not present

## 2014-08-30 DIAGNOSIS — M797 Fibromyalgia: Secondary | ICD-10-CM | POA: Insufficient documentation

## 2014-08-30 DIAGNOSIS — R109 Unspecified abdominal pain: Secondary | ICD-10-CM | POA: Diagnosis not present

## 2014-08-30 DIAGNOSIS — R05 Cough: Secondary | ICD-10-CM | POA: Diagnosis present

## 2014-08-30 LAB — CBC WITH DIFFERENTIAL/PLATELET
Basophils Absolute: 0 10*3/uL (ref 0.0–0.1)
Basophils Relative: 0 % (ref 0–1)
Eosinophils Absolute: 0.1 10*3/uL (ref 0.0–0.7)
Eosinophils Relative: 1 % (ref 0–5)
HCT: 38.9 % (ref 36.0–46.0)
Hemoglobin: 13.2 g/dL (ref 12.0–15.0)
Lymphocytes Relative: 15 % (ref 12–46)
Lymphs Abs: 2.1 10*3/uL (ref 0.7–4.0)
MCH: 32.1 pg (ref 26.0–34.0)
MCHC: 33.9 g/dL (ref 30.0–36.0)
MCV: 94.6 fL (ref 78.0–100.0)
Monocytes Absolute: 1 10*3/uL (ref 0.1–1.0)
Monocytes Relative: 7 % (ref 3–12)
Neutro Abs: 10.5 10*3/uL — ABNORMAL HIGH (ref 1.7–7.7)
Neutrophils Relative %: 77 % (ref 43–77)
Platelets: 538 10*3/uL — ABNORMAL HIGH (ref 150–400)
RBC: 4.11 MIL/uL (ref 3.87–5.11)
RDW: 15 % (ref 11.5–15.5)
WBC: 13.7 10*3/uL — ABNORMAL HIGH (ref 4.0–10.5)

## 2014-08-30 LAB — COMPREHENSIVE METABOLIC PANEL
ALT: 19 U/L (ref 14–54)
AST: 22 U/L (ref 15–41)
Albumin: 3.8 g/dL (ref 3.5–5.0)
Alkaline Phosphatase: 122 U/L (ref 38–126)
Anion gap: 7 (ref 5–15)
BUN: 16 mg/dL (ref 6–20)
CO2: 28 mmol/L (ref 22–32)
Calcium: 9.5 mg/dL (ref 8.9–10.3)
Chloride: 104 mmol/L (ref 101–111)
Creatinine, Ser: 1.1 mg/dL — ABNORMAL HIGH (ref 0.44–1.00)
GFR calc Af Amer: >60 mL/min (ref >60)
GFR calc non Af Amer: 53 mL/min — ABNORMAL LOW (ref >60)
Glucose, Bld: 123 mg/dL — ABNORMAL HIGH (ref 65–99)
Potassium: 4.8 mmol/L (ref 3.5–5.1)
Sodium: 139 mmol/L (ref 135–145)
Total Bilirubin: 0.6 mg/dL (ref 0.3–1.2)
Total Protein: 7 g/dL (ref 6.5–8.1)

## 2014-08-30 LAB — LIPASE, BLOOD: Lipase: 19 U/L — ABNORMAL LOW (ref 22–51)

## 2014-08-30 MED ORDER — ALBUTEROL SULFATE HFA 108 (90 BASE) MCG/ACT IN AERS
1.0000 | INHALATION_SPRAY | Freq: Once | RESPIRATORY_TRACT | Status: AC
  Administered 2014-08-30: 1 via RESPIRATORY_TRACT
  Filled 2014-08-30: qty 6.7

## 2014-08-30 MED ORDER — GUAIFENESIN-DM 100-10 MG/5ML PO SYRP: 5.0000 mL | ORAL_SOLUTION | ORAL | Status: DC | PRN

## 2014-08-30 MED ORDER — GUAIFENESIN ER 600 MG PO TB12
600.0000 mg | ORAL_TABLET | Freq: Once | ORAL | Status: AC
  Administered 2014-08-30: 600 mg via ORAL
  Filled 2014-08-30: qty 1

## 2014-08-30 MED ORDER — ONDANSETRON HCL 4 MG PO TABS: 4.0000 mg | ORAL_TABLET | Freq: Three times a day (TID) | ORAL | Status: DC | PRN

## 2014-08-30 MED ORDER — SODIUM CHLORIDE 0.9 % IV BOLUS (SEPSIS)
1000.0000 mL | Freq: Once | INTRAVENOUS | Status: AC
  Administered 2014-08-30: 1000 mL via INTRAVENOUS

## 2014-08-30 MED ORDER — IPRATROPIUM-ALBUTEROL 0.5-2.5 (3) MG/3ML IN SOLN
3.0000 mL | Freq: Once | RESPIRATORY_TRACT | Status: AC
  Administered 2014-08-30: 3 mL via RESPIRATORY_TRACT
  Filled 2014-08-30: qty 3

## 2014-08-30 MED ORDER — ONDANSETRON HCL 4 MG/2ML IJ SOLN
4.0000 mg | Freq: Once | INTRAMUSCULAR | Status: AC
  Administered 2014-08-30: 4 mg via INTRAVENOUS
  Filled 2014-08-30: qty 2

## 2014-08-30 MED ORDER — PREDNISONE 20 MG PO TABS: 40.0000 mg | ORAL_TABLET | Freq: Every day | ORAL | Status: DC

## 2014-08-30 NOTE — ED Notes (Signed)
O2 sat remained at 94% on RA while ambulating in hallway.

## 2014-08-30 NOTE — Discharge Instructions (Signed)
Thank you for allowing [place] to be involved in your healthcare while you were hospitalized at [facility].   Please note that there have been changes to your home medications.  --> PLEASE LOOK AT YOUR DISCHARGE MEDICATION LIST FOR DETAILS.  Please call your PCP if you have any questions or concerns, or any difficulty getting any of your medications.  Please return to the ER if you have worsening of your symptoms or new severe symptoms arise.  FOR NAUSEA, TAKE ZOFRAN 4 MG EVERY 8 HOURS AS NEEDED. TAKE ROBITUSSIN FOR COUGH AND FOR YOUR CHEST CONGESTION.   PLEASE FOLLOW UP WITH A PRIMARY CARE PROVIDER. WE HAVE PROVIDED YOU A LIST, BUT WE RECOMMEND CALLING YOUR MEDICARE PROVIDER TO OBTAIN A LIST OF PROVIDERS IN YOUR NETWORK.   Upper Respiratory Infection, Adult An upper respiratory infection (URI) is also sometimes known as the common cold. The upper respiratory tract includes the nose, sinuses, throat, trachea, and bronchi. Bronchi are the airways leading to the lungs. Most people improve within 1 week, but symptoms can last up to 2 weeks. A residual cough may last even longer.  CAUSES Many different viruses can infect the tissues lining the upper respiratory tract. The tissues become irritated and inflamed and often become very moist. Mucus production is also common. A cold is contagious. You can easily spread the virus to others by oral contact. This includes kissing, sharing a glass, coughing, or sneezing. Touching your mouth or nose and then touching a surface, which is then touched by another person, can also spread the virus. SYMPTOMS  Symptoms typically develop 1 to 3 days after you come in contact with a cold virus. Symptoms vary from person to person. They may include:  Runny nose.  Sneezing.  Nasal congestion.  Sinus irritation.  Sore throat.  Loss of voice (laryngitis).  Cough.  Fatigue.  Muscle aches.  Loss of appetite.  Headache.  Low-grade  fever. DIAGNOSIS  You might diagnose your own cold based on familiar symptoms, since most people get a cold 2 to 3 times a year. Your caregiver can confirm this based on your exam. Most importantly, your caregiver can check that your symptoms are not due to another disease such as strep throat, sinusitis, pneumonia, asthma, or epiglottitis. Blood tests, throat tests, and X-rays are not necessary to diagnose a common cold, but they may sometimes be helpful in excluding other more serious diseases. Your caregiver will decide if any further tests are required. RISKS AND COMPLICATIONS  You may be at risk for a more severe case of the common cold if you smoke cigarettes, have chronic heart disease (such as heart failure) or lung disease (such as asthma), or if you have a weakened immune system. The very young and very old are also at risk for more serious infections. Bacterial sinusitis, middle ear infections, and bacterial pneumonia can complicate the common cold. The common cold can worsen asthma and chronic obstructive pulmonary disease (COPD). Sometimes, these complications can require emergency medical care and may be life-threatening. PREVENTION  The best way to protect against getting a cold is to practice good hygiene. Avoid oral or hand contact with people with cold symptoms. Wash your hands often if contact occurs. There is no clear evidence that vitamin C, vitamin E, echinacea, or exercise reduces the chance of developing a cold. However, it is always recommended to get plenty of rest and practice good nutrition. TREATMENT  Treatment is directed at relieving symptoms. There is  no cure. Antibiotics are not effective, because the infection is caused by a virus, not by bacteria. Treatment may include:  Increased fluid intake. Sports drinks offer valuable electrolytes, sugars, and fluids.  Breathing heated mist or steam (vaporizer or shower).  Eating chicken soup or other clear broths, and  maintaining good nutrition.  Getting plenty of rest.  Using gargles or lozenges for comfort.  Controlling fevers with ibuprofen or acetaminophen as directed by your caregiver.  Increasing usage of your inhaler if you have asthma. Zinc gel and zinc lozenges, taken in the first 24 hours of the common cold, can shorten the duration and lessen the severity of symptoms. Pain medicines may help with fever, muscle aches, and throat pain. A variety of non-prescription medicines are available to treat congestion and runny nose. Your caregiver can make recommendations and may suggest nasal or lung inhalers for other symptoms.  HOME CARE INSTRUCTIONS   Only take over-the-counter or prescription medicines for pain, discomfort, or fever as directed by your caregiver.  Use a warm mist humidifier or inhale steam from a shower to increase air moisture. This may keep secretions moist and make it easier to breathe.  Drink enough water and fluids to keep your urine clear or pale yellow.  Rest as needed.  Return to work when your temperature has returned to normal or as your caregiver advises. You may need to stay home longer to avoid infecting others. You can also use a face mask and careful hand washing to prevent spread of the virus. SEEK MEDICAL CARE IF:   After the first few days, you feel you are getting worse rather than better.  You need your caregiver's advice about medicines to control symptoms.  You develop chills, worsening shortness of breath, or brown or red sputum. These may be signs of pneumonia.  You develop yellow or brown nasal discharge or pain in the face, especially when you bend forward. These may be signs of sinusitis.  You develop a fever, swollen neck glands, pain with swallowing, or white areas in the back of your throat. These may be signs of strep throat. SEEK IMMEDIATE MEDICAL CARE IF:   You have a fever.  You develop severe or persistent headache, ear pain, sinus pain,  or chest pain.  You develop wheezing, a prolonged cough, cough up blood, or have a change in your usual mucus (if you have chronic lung disease).  You develop sore muscles or a stiff neck. Document Released: 08/21/2000 Document Revised: 05/20/2011 Document Reviewed: 06/02/2013 [facility] Patient Information 2015 [place], [place]. This information is not intended to replace advice given to you by your health care provider. Make sure you discuss any questions you have with your health care provider.

## 2014-08-30 NOTE — ED Notes (Signed)
Pt reports having abdominal pain and nausea x several weeks. Pt c/o upper respiratory symptoms x 10 days and began vomiting last Thursday. Reports weight loss with difficulty tolerating PO food/fluids. Hx gallbladder removed. Respirations even/unlabored.

## 2014-08-30 NOTE — ED Provider Notes (Signed)
CSN: [id number]     Arrival date & time 08/30/14  4944 History   First MD Initiated Contact with Patient 08/30/14 [phone]     Chief Complaint  Patient presents with  . Abdominal Pain  . Emesis  . URI   HPI  [name] is a 64 yo female with PMHx of HTN and chronic pain who presents with complaint of nausea, vomiting and chills  for 5 days. Patient states 5 days ago she developed cough, shortness of breath, wheezing, nasal congestion, sore throat, chill, nausea with non-bloody, non-bilious vomiting and abdominal pain. Her husband recently had similar symptoms but has improved. She has tried only tylenol and mucinex for her symptoms. She has become progressively short of breath and weak and states it is difficult for her to get from room to room. She has not been able to keep much down other than water. She reports a 7-10 pound weight loss in the last week. She denies any chest pain or diarrhea. No suprapubic pain, no dysuria.   Patient does not drink alcohol. She admits to tobacco use at 1 ppd. No illicit drug use. 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  Patient also admits to chronic right arm/right chest/right flank pain that has been present for years. Patient has had an extensive work up for this pain including and appendectomy, cholecystectomy and imaging. She was seen in the ED for this complaint in March which was deemed chronic in nature. With an unremarkable workup, pain was questioned to radicular in nature, given norco, and recommended follow up with PCP.  Past Medical History  Diagnosis Date  . Arrhythmia   . Arthritis   . GERD (gastroesophageal reflux disease)   . Heart murmur   . PONV (postoperative nausea and vomiting)   . Hypertension   . Fibromyalgia   . H/O: substance abuse 2005    narcotic usage due to chronic back pain   Past Surgical History  Procedure Laterality Date  . Abdominal hysterectomy    . Abdominal  surgery    . Breast surgery    . Appendectomy    . Ovarian cyst surgery    . Fracture surgery      right upper arm  . Cholecystectomy N/A 09/30/2012    Procedure: LAPAROSCOPIC CHOLECYSTECTOMY WITH INTRAOPERATIVE CHOLANGIOGRAM;  Surgeon: Imogene Burn. Georgette Dover, MD;  Location: [facility]S;  Service: General;  Laterality: N/A;   Family History  Problem Relation Age of Onset  . Alzheimer's disease Mother   . Cancer Father     prostate  . Heart disease Father    History  Substance Use Topics  . Smoking status: Current Every Day Smoker -- 1.00 packs/day for 30 years    Types: Cigarettes  . Smokeless tobacco: Never Used  . Alcohol Use: No   OB History    No data available     Review of Systems General: Admits chills, fatigue, decreased appetite. Denies fever and diaphoresis.  Respiratory: Admits to SOB, productive cough, and wheezing.   Cardiovascular: Denies chest pain and palpitations.  Gastrointestinal: Admits to nausea, vomiting, and abdominal pain. Denies diarrhea, constipation, blood in stool and abdominal distention.  Genitourinary: Denies dysuria, urgency, frequency, suprapubic pain  Musculoskeletal: Admits to chronic right sided pain. Denies back pain, joint swelling, arthralgias and gait problem.  Skin: Denies pallor, rash and wounds.  Neurological: Admits to generalized weakness. Denies dizziness, lightheadedness, numbness, and syncope.  Allergies  Codeine and Nsaids  Home Medications   Prior to Admission medications   Medication Sig Start Date End Date Taking? Authorizing Provider  acetaminophen (TYLENOL) 500 MG tablet Take 1,000 mg by mouth 3 (three) times daily.    Yes Historical Provider, MD  carvedilol (COREG) 12.5 MG tablet Take 12.5 mg by mouth daily.    Yes Historical Provider, MD  diphenhydrAMINE (BENADRYL) 25 MG tablet Take 50 mg by mouth daily as needed for allergies.    Yes Historical Provider, MD  methadone (DOLOPHINE) 10 MG/ML solution Take 90 mg by mouth daily.     Yes Historical Provider, MD  Tetrahydrozoline HCl (EYE DROPS OP) Apply 2 drops to eye as needed (irritation).   Yes Historical Provider, MD  guaiFENesin-dextromethorphan (ROBITUSSIN DM) 100-10 MG/5ML syrup Take 5 mLs by mouth every 4 (four) hours as needed for cough. 08/30/14   Kaylen Motl Sherral Hammers, MD  HYDROcodone-acetaminophen (NORCO/VICODIN) 5-325 MG per tablet Take 1-2 tablets by mouth every 4 (four) hours as needed. Patient not taking: Reported on 08/30/2014 05/26/13   Tatyana Kirichenko, PA-C  ondansetron (ZOFRAN) 4 MG tablet Take 1 tablet (4 mg total) by mouth every 8 (eight) hours as needed for nausea or vomiting. 08/30/14   Yorel Redder Sherral Hammers, MD  predniSONE (DELTASONE) 20 MG tablet Take 2 tablets (40 mg total) by mouth daily with breakfast. 08/30/14   Zulma Court Sherral Hammers, MD   Physical Exam  Filed Vitals:   08/30/14 0930 08/30/14 1008 08/30/14 1015 08/30/14 1053  BP: 105/69 122/88 106/94 121/57  Pulse: 65 78 86 66  Temp:      TempSrc:      Resp: 18 24 19 17   Height:      Weight:      SpO2: 93% 95% 94% 93%   General: Vital signs reviewed.  Patient is thin, chronically ill appearing, in no acute distress and cooperative with exam.  HEENT: Disconjugate gaze, edematous nasal turbinates, no cervical lymphadenopathy.  Cardiovascular: RRR, S1 normal, S2 normal, no murmurs, gallops, or rubs. Pulmonary/Chest: Diffuse inspiratory and expiratory wheezes and rhonchi. No rales.  Abdominal: Soft, tender to palpation in epigastric region, non-distended, BS +, no guarding present.  Musculoskeletal: No joint deformities, erythema, or stiffness, ROM full and nontender. Extremities: No lower extremity edema bilaterally, pulses symmetric and intact bilaterally.  Skin: Warm, dry and intact. No rashes.  Psychiatric: Normal mood and affect. speech and behavior is normal. Cognition and memory are normal.   ED Course  Procedures (including critical care time) Labs Review Labs Reviewed  CBC WITH  DIFFERENTIAL/PLATELET - Abnormal; Notable for the following:    WBC 13.7 (*)    Platelets 538 (*)    Neutro Abs 10.5 (*)    All other components within normal limits  COMPREHENSIVE METABOLIC PANEL - Abnormal; Notable for the following:    Glucose, Bld 123 (*)    Creatinine, Ser 1.10 (*)    GFR calc non Af Amer 53 (*)    All other components within normal limits  LIPASE, BLOOD - Abnormal; Notable for the following:    Lipase 19 (*)    All other components within normal limits    Imaging Review Dg Chest 2 View  08/30/2014   CLINICAL DATA:  64 year old female with 2 weeks of abdominal pain, emesis. Initial encounter.  EXAM: CHEST  2 VIEW  COMPARISON:  05/26/2013 and earlier.  FINDINGS: Stable somewhat large lung volumes. Stable cardiac size and mediastinal contours. Visualized tracheal air column is within normal limits. Mild  apical pleural/ parenchymal scarring is stable. No pneumothorax. No pulmonary edema, pleural effusion or consolidation. Mild increased interstitial markings in both lungs are stable. No acute pulmonary opacity identified. No acute osseous abnormality identified. Negative visible bowel gas pattern and no pneumoperitoneum.  IMPRESSION: Chronic pulmonary interstitial changes. No acute cardiopulmonary abnormality.   Electronically Signed   By: Genevie Ann M.D.   On: 08/30/2014 10:01     EKG Interpretation None      MDM   Final diagnoses:  Acute URI   64 yo female presenting with complaint of nasal congestion, chills, productive cough, nausea, vomiting and weakness likely secondary to URI/Viral gastroenteritis. Vital signs showed the patient was afebrile, low-normal blood pressure, and satting 92-94% on room air. Patient was started on Duoneb treatment for wheezing and shortness of breath. Also given NS 1 L bolus. CBC revealed mild leukocytosis of 13.7. Lipase normal. BMET reveals mild AKI (Cr 1.10) as compared to baseline (0.8). EKG is normal sinus rhythm without signs of  ischemia. CXR revealed chronic pulmonary interstitial changes. No acute cardiopulmonary abnormality. Sats on ambulation remained at 94%.   Patient felt better after zofran and ns. She is safe for discharge with follow up with PCP. I have provided patient with zofran, robitussin dm, prednisone, and albuterol inhaler.   Case discussed in full with ED attending physician, Dr. Tawnya Crook.   Osa Craver, DO PGY-1 Internal Medicine Resident Pager # [phone] 08/30/2014 11:14 AM   Roxine Caddy Sherral Hammers, MD 08/30/14 1114  Ernestina Patches, MD 08/31/14 1056

## 2015-08-16 ENCOUNTER — Emergency Department (HOSPITAL_COMMUNITY)
Admission: EM | Admit: 2015-08-16 | Discharge: 2015-08-16 | Disposition: A | Discharge status: Home / Self Care | Payer: Medicare Other | Attending: Emergency Medicine | Admitting: Emergency Medicine

## 2015-08-16 ENCOUNTER — Encounter (HOSPITAL_COMMUNITY): Payer: Self-pay | Admitting: Emergency Medicine

## 2015-08-16 ENCOUNTER — Emergency Department (HOSPITAL_COMMUNITY): Payer: Medicare Other

## 2015-08-16 DIAGNOSIS — G8929 Other chronic pain: Secondary | ICD-10-CM | POA: Diagnosis not present

## 2015-08-16 DIAGNOSIS — Z79899 Other long term (current) drug therapy: Secondary | ICD-10-CM | POA: Insufficient documentation

## 2015-08-16 DIAGNOSIS — Z7952 Long term (current) use of systemic steroids: Secondary | ICD-10-CM | POA: Diagnosis not present

## 2015-08-16 DIAGNOSIS — R109 Unspecified abdominal pain: Secondary | ICD-10-CM

## 2015-08-16 DIAGNOSIS — R05 Cough: Secondary | ICD-10-CM | POA: Diagnosis not present

## 2015-08-16 DIAGNOSIS — R1011 Right upper quadrant pain: Secondary | ICD-10-CM | POA: Diagnosis not present

## 2015-08-16 DIAGNOSIS — I10 Essential (primary) hypertension: Secondary | ICD-10-CM | POA: Insufficient documentation

## 2015-08-16 DIAGNOSIS — F17210 Nicotine dependence, cigarettes, uncomplicated: Secondary | ICD-10-CM | POA: Insufficient documentation

## 2015-08-16 DIAGNOSIS — Z79891 Long term (current) use of opiate analgesic: Secondary | ICD-10-CM | POA: Insufficient documentation

## 2015-08-16 LAB — COMPREHENSIVE METABOLIC PANEL
ALT: 24 U/L (ref 14–54)
AST: 18 U/L (ref 15–41)
Albumin: 4.3 g/dL (ref 3.5–5.0)
Alkaline Phosphatase: 172 U/L — ABNORMAL HIGH (ref 38–126)
Anion gap: 9 (ref 5–15)
BUN: 20 mg/dL (ref 6–20)
CO2: 29 mmol/L (ref 22–32)
Calcium: 9.6 mg/dL (ref 8.9–10.3)
Chloride: 99 mmol/L — ABNORMAL LOW (ref 101–111)
Creatinine, Ser: 1.24 mg/dL — ABNORMAL HIGH (ref 0.44–1.00)
GFR calc Af Amer: 52 mL/min — ABNORMAL LOW (ref >60)
GFR calc non Af Amer: 45 mL/min — ABNORMAL LOW (ref >60)
Glucose, Bld: 104 mg/dL — ABNORMAL HIGH (ref 65–99)
Potassium: 3.6 mmol/L (ref 3.5–5.1)
Sodium: 137 mmol/L (ref 135–145)
Total Bilirubin: 0.6 mg/dL (ref 0.3–1.2)
Total Protein: 8.1 g/dL (ref 6.5–8.1)

## 2015-08-16 LAB — CBC WITH DIFFERENTIAL/PLATELET
Basophils Absolute: 0 10*3/uL (ref 0.0–0.1)
Basophils Relative: 0 %
Eosinophils Absolute: 0 10*3/uL (ref 0.0–0.7)
Eosinophils Relative: 0 %
HCT: 40.1 % (ref 36.0–46.0)
Hemoglobin: 13.9 g/dL (ref 12.0–15.0)
Lymphocytes Relative: 23 %
Lymphs Abs: 2.6 10*3/uL (ref 0.7–4.0)
MCH: 32.4 pg (ref 26.0–34.0)
MCHC: 34.7 g/dL (ref 30.0–36.0)
MCV: 93.5 fL (ref 78.0–100.0)
Monocytes Absolute: 1 10*3/uL (ref 0.1–1.0)
Monocytes Relative: 9 %
Neutro Abs: 7.5 10*3/uL (ref 1.7–7.7)
Neutrophils Relative %: 68 %
Platelets: 616 10*3/uL — ABNORMAL HIGH (ref 150–400)
RBC: 4.29 MIL/uL (ref 3.87–5.11)
RDW: 13.3 % (ref 11.5–15.5)
WBC: 11.2 10*3/uL — ABNORMAL HIGH (ref 4.0–10.5)

## 2015-08-16 LAB — URINALYSIS, ROUTINE W REFLEX MICROSCOPIC
Glucose, UA: NEGATIVE mg/dL
Ketones, ur: NEGATIVE mg/dL
Nitrite: NEGATIVE
Protein, ur: 30 mg/dL — AB
Specific Gravity, Urine: 1.028 (ref 1.005–1.030)
pH: 5 (ref 5.0–8.0)

## 2015-08-16 LAB — URINE MICROSCOPIC-ADD ON

## 2015-08-16 LAB — LIPASE, BLOOD: Lipase: 28 U/L (ref 11–51)

## 2015-08-16 MED ORDER — SODIUM CHLORIDE 0.9 % IV SOLN
INTRAVENOUS | Status: DC
  Administered 2015-08-16: 15:00:00 via INTRAVENOUS

## 2015-08-16 NOTE — ED Provider Notes (Signed)
CSN: [id number]     Arrival date & time 08/16/15  1312 History   First MD Initiated Contact with Patient 08/16/15 1420     Chief Complaint  Patient presents with  . Abdominal Pain  . Cough     (Consider location/radiation/quality/duration/timing/severity/associated sxs/prior Treatment) HPI Comments: Patient here complaining of 2 years constant abdominal discomfort localized to the right upper quadrant. Has had a cholecystectomy in the past. Has had some cough and nasal congestion as well. Has not follow-up with her doctor due to insurance reasons. No associated fever, diarrhea. Did have vomiting yesterday but none today. Patient has been told the past that her symptoms are the sequela of a prior colitis. She denies any bloody stools. No urinary symptoms. Does take methadone daily. No dyspnea. Patient is here today because she states that she just wants to get to the bottom of what is going on with her  Patient is a 65 y.o. female presenting with abdominal pain and cough. The history is provided by the patient and the spouse.  Abdominal Pain Associated symptoms: cough   Cough   Past Medical History  Diagnosis Date  . Arrhythmia   . Arthritis   . GERD (gastroesophageal reflux disease)   . Heart murmur   . PONV (postoperative nausea and vomiting)   . Hypertension   . Fibromyalgia   . H/O: substance abuse 2005    narcotic usage due to chronic back pain   Past Surgical History  Procedure Laterality Date  . Abdominal hysterectomy    . Abdominal surgery    . Breast surgery    . Appendectomy    . Ovarian cyst surgery    . Fracture surgery      right upper arm  . Cholecystectomy N/A 09/30/2012    Procedure: LAPAROSCOPIC CHOLECYSTECTOMY WITH INTRAOPERATIVE CHOLANGIOGRAM;  Surgeon: Imogene Burn. Georgette Dover, MD;  Location: [facility];  Service: General;  Laterality: N/A;   Family History  Problem Relation Age of Onset  . Alzheimer's disease Mother   . Cancer Father     prostate  . Heart disease  Father    Social History  Substance Use Topics  . Smoking status: Current Every Day Smoker -- 1.00 packs/day for 30 years    Types: Cigarettes  . Smokeless tobacco: Never Used  . Alcohol Use: No   OB History    No data available     Review of Systems  Respiratory: Positive for cough.   Gastrointestinal: Positive for abdominal pain.  All other systems reviewed and are negative.     Allergies  Codeine and Nsaids  Home Medications   Prior to Admission medications   Medication Sig Start Date End Date Taking? Authorizing Provider  acetaminophen (TYLENOL) 500 MG tablet Take 1,000 mg by mouth 3 (three) times daily.     Historical Provider, MD  carvedilol (COREG) 12.5 MG tablet Take 12.5 mg by mouth daily.     Historical Provider, MD  diphenhydrAMINE (BENADRYL) 25 MG tablet Take 50 mg by mouth daily as needed for allergies.     Historical Provider, MD  guaiFENesin-dextromethorphan (ROBITUSSIN DM) 100-10 MG/5ML syrup Take 5 mLs by mouth every 4 (four) hours as needed for cough. 08/30/14   Florinda Marker, MD  HYDROcodone-acetaminophen (NORCO/VICODIN) 5-325 MG per tablet Take 1-2 tablets by mouth every 4 (four) hours as needed. Patient not taking: Reported on 08/30/2014 05/26/13   Jeannett Senior, PA-C  methadone (DOLOPHINE) 10 MG/ML solution Take 90 mg by mouth daily.  Historical Provider, MD  ondansetron (ZOFRAN) 4 MG tablet Take 1 tablet (4 mg total) by mouth every 8 (eight) hours as needed for nausea or vomiting. 08/30/14   Florinda Marker, MD  predniSONE (DELTASONE) 20 MG tablet Take 2 tablets (40 mg total) by mouth daily with breakfast. 08/30/14   Florinda Marker, MD  Tetrahydrozoline HCl (EYE DROPS OP) Apply 2 drops to eye as needed (irritation).    Historical Provider, MD   BP 116/74 mmHg  Pulse 83  Temp(Src) 98.2 F (36.8 C) (Oral)  Resp 15  SpO2 99% Physical Exam  Constitutional: She is oriented to person, place, and time. She appears well-developed and well-nourished.   Non-toxic appearance. No distress.  HENT:  Head: Normocephalic and atraumatic.  Eyes: Conjunctivae, EOM and lids are normal. Pupils are equal, round, and reactive to light.  Neck: Normal range of motion. Neck supple. No tracheal deviation present. No thyroid mass present.  Cardiovascular: Normal rate, regular rhythm and normal heart sounds.  Exam reveals no gallop.   No murmur heard. Pulmonary/Chest: Effort normal and breath sounds normal. No stridor. No respiratory distress. She has no decreased breath sounds. She has no wheezes. She has no rhonchi. She has no rales.  Abdominal: Soft. Normal appearance and bowel sounds are normal. She exhibits no distension. There is tenderness in the right upper quadrant. There is no rigidity, no rebound, no guarding and no CVA tenderness.    Musculoskeletal: Normal range of motion. She exhibits no edema or tenderness.  Neurological: She is alert and oriented to person, place, and time. She has normal strength. No cranial nerve deficit or sensory deficit. GCS eye subscore is 4. GCS verbal subscore is 5. GCS motor subscore is 6.  Skin: Skin is warm and dry. No abrasion and no rash noted.  Psychiatric: She has a normal mood and affect. Her speech is normal and behavior is normal.  Nursing note and vitals reviewed.   ED Course  Procedures (including critical care time) Labs Review Labs Reviewed  CBC WITH DIFFERENTIAL/PLATELET  COMPREHENSIVE METABOLIC PANEL  LIPASE, BLOOD  URINALYSIS, ROUTINE W REFLEX MICROSCOPIC (NOT AT [facility])    Imaging Review No results found. I have personally reviewed and evaluated these images and lab results as part of my medical decision-making.   EKG Interpretation None      MDM   Final diagnoses:  None   Labs and x-rays reviewed with patient. No acute findings. Encouraged to follow-up with her Dr.    Lacretia Leigh, MD 08/16/15 [phone]

## 2015-08-16 NOTE — Discharge Instructions (Signed)

## 2015-08-16 NOTE — ED Notes (Signed)
Discharge instructions and follow up care reviewed with patient. Patient verbalized understanding. 

## 2015-08-16 NOTE — ED Notes (Addendum)
Pt c/o constant right side abdominal pain radiating to ribs x 2 years.  Pt also reports productive cough and nasal congestion.

## 2015-09-01 ENCOUNTER — Ambulatory Visit (HOSPITAL_COMMUNITY)
Admission: EM | Admit: 2015-09-01 | Discharge: 2015-09-01 | Disposition: A | Discharge status: Home / Self Care | Payer: Medicare Other | Attending: Emergency Medicine | Admitting: Emergency Medicine

## 2015-09-01 ENCOUNTER — Encounter (HOSPITAL_COMMUNITY): Payer: Self-pay | Admitting: Emergency Medicine

## 2015-09-01 DIAGNOSIS — K029 Dental caries, unspecified: Secondary | ICD-10-CM

## 2015-09-01 DIAGNOSIS — K047 Periapical abscess without sinus: Secondary | ICD-10-CM

## 2015-09-01 MED ORDER — ONDANSETRON 8 MG PO TBDP: 8.0000 mg | ORAL_TABLET | Freq: Three times a day (TID) | ORAL | Status: DC | PRN

## 2015-09-01 MED ORDER — PENICILLIN V POTASSIUM 500 MG PO TABS: 500.0000 mg | ORAL_TABLET | Freq: Four times a day (QID) | ORAL | Status: DC

## 2015-09-01 MED ORDER — LIDOCAINE VISCOUS 2 % MT SOLN: 10.0000 mL | Freq: Four times a day (QID) | OROMUCOSAL | Status: DC | PRN

## 2015-09-01 MED ORDER — CHLORHEXIDINE GLUCONATE 0.12 % MT SOLN: OROMUCOSAL | Status: DC

## 2015-09-01 NOTE — ED Provider Notes (Signed)
HPI  SUBJECTIVE:  [name] is a 65 y.o. female who presents with dull, constant, anterior left lower jaw pain that she describes as achy for the past 3 days. Reports anterior facial swelling. She reports approximately 2 episodes a clear, nonbilious, nonbloody emesis in the morning during this time, but is able tolerate by mouth, particularly at night. She reports mild low abdominal abdominal pain with the vomiting, states that it resolves after. She is having normal bowel movements. No distention. She has been taking Tylenol 3 tabs every 4 hours, which relieve her symptoms. Symptoms are worse with chewing. No fevers, neck stiffness, sensation of throat swelling shut, recent dental trauma. Patient states that she "has bad teeth". Past medical history narcotic abuse, currently on methadone, "stomach issues", smoker. No history of diabetes, hypertension. Patient states that she can take ibuprofen secondary to "eats my stomach up". PMD: None. Dentist: None.     Past Medical History  Diagnosis Date  . Arrhythmia   . Arthritis   . GERD (gastroesophageal reflux disease)   . Heart murmur   . PONV (postoperative nausea and vomiting)   . Hypertension   . Fibromyalgia   . H/O: substance abuse 2005    narcotic usage due to chronic back pain    Past Surgical History  Procedure Laterality Date  . Abdominal hysterectomy    . Abdominal surgery    . Breast surgery    . Appendectomy    . Ovarian cyst surgery    . Fracture surgery      right upper arm  . Cholecystectomy N/A 09/30/2012    Procedure: LAPAROSCOPIC CHOLECYSTECTOMY WITH INTRAOPERATIVE CHOLANGIOGRAM;  Surgeon: Imogene Burn. Georgette Dover, MD;  Location: [facility];  Service: General;  Laterality: N/A;    Family History  Problem Relation Age of Onset  . Alzheimer's disease Mother   . Cancer Father     prostate  . Heart disease Father     Social History  Substance Use Topics  . Smoking status: Current Every Day Smoker -- 1.00 packs/day for 30  years    Types: Cigarettes  . Smokeless tobacco: Never Used  . Alcohol Use: No    No current facility-administered medications for this encounter.  Current outpatient prescriptions:  .  acetaminophen (TYLENOL) 500 MG tablet, Take 1,000 mg by mouth 3 (three) times daily as needed for moderate pain. , Disp: , Rfl:  .  methadone (DOLOPHINE) 10 MG/ML solution, Take 95 mg by mouth daily. , Disp: , Rfl:  .  carvedilol (COREG) 12.5 MG tablet, Take 12.5 mg by mouth daily. , Disp: , Rfl:  .  chlorhexidine (PERIDEX) 0.12 % solution, 15 mL swish and spit bid, Disp: 480 mL, Rfl: 0 .  lidocaine (XYLOCAINE) 2 % solution, Use as directed 10 mLs in the mouth or throat every 6 (six) hours as needed for mouth pain. Hold in mouth and spit. Do not swallow., Disp: 200 mL, Rfl: 0 .  ondansetron (ZOFRAN ODT) 8 MG disintegrating tablet, Take 1 tablet (8 mg total) by mouth every 8 (eight) hours as needed for nausea or vomiting., Disp: 20 tablet, Rfl: 0 .  penicillin v potassium (VEETID) 500 MG tablet, Take 1 tablet (500 mg total) by mouth 4 (four) times daily. X 10 days, Disp: 40 tablet, Rfl: 0 .  ranitidine (ZANTAC) 150 MG tablet, Take 300 mg by mouth 2 (two) times daily as needed for heartburn., Disp: , Rfl:  .  Tetrahydrozoline HCl (EYE DROPS OP), Apply 2 drops to  eye as needed (irritation)., Disp: , Rfl:   Allergies  Allergen Reactions  . Codeine Nausea And Vomiting  . Nsaids Other (See Comments)    Stomach ulcers     ROS  As noted in HPI.   Physical Exam  BP 152/80 mmHg  Pulse 79  Temp(Src) 98.4 F (36.9 C) (Oral)  Resp 18  SpO2 96%  Constitutional: Well developed, well nourished, no acute distress Eyes:  EOMI, conjunctiva normal bilaterally HENT: Normocephalic, atraumatic,mucus membranes moist. Extensive dental decay. Tooth #24, is left central incisor extensively decayed, tender to palpation. Positive gingival swelling, tenderness. No expressible purulent drainage. No fluctuance. No swelling  inferior to the tongue. Lymph: No submental, cervical lymphadenopathy  neck: No swelling inferior to the jaw, neck stiffness Respiratory: Normal inspiratory effort Cardiovascular: Normal rate GI: nondistended skin: No rash, skin intact Musculoskeletal: no deformities Neurologic: Alert & oriented x 3, no focal neuro deficits Psychiatric: Speech and behavior appropriate   ED Course   Medications - No data to display  No orders of the defined types were placed in this encounter.    No results found for this or any previous visit (from the past 24 hour(s)). No results found.  ED Clinical Impression  Dental decay  Dental abscess   ED Assessment/Plan  [place] Narcotic database reviewed. No narcotic prescriptions in the past 6 months.  No evidence of gingival abscess to I&D at this time. Presentation consistent with a dental abscess. No evidence of deep space infection at this time specifically Ludwig angina. Declined narcotic prescription today. Advised Tylenol 1 g 4 times a day, will send home with viscous lidocaine, penicillin, Zofran, Peridex mouthwash. Will give list of dentist and primary care referral list. Discussed signs and symptoms that should prompt return to the ER. Patient agrees with plan.  *This clinic note was created using Dragon dictation software. Therefore, there may be occasional mistakes despite careful proofreading.  ?   Melynda Ripple, MD 09/01/15 1334

## 2015-09-01 NOTE — ED Notes (Signed)
The patient presented to the [facility] with a complaint of a possible dental abscess for 2 days and she also reported that she started vomiting in the AM about 3 days ago.

## 2015-09-01 NOTE — Discharge Instructions (Signed)
Look up the [facility] for free dental clinics. undoomedical.com.asp  Get there early and be prepared to wait. [facility] and [employer] have [employer], advertising schools that provide low cost routine dental care.   Other resources: [facility] [place], [place] [phone]  Patients with Medicaid: [place] [address] [place] Phone:  [phone]                                                  Phone:  [phone]  Dr. Ardyth Harps [address]. [phone]  If unable to pay or uninsured, contact:  [facility] or [facility]. to become qualified for the adult dental clinic.  No matter what dental problem you have, it will not get better unless you get good dental care.  If the tooth is not taken care of, your symptoms will come back in time and you will be visiting [place] again in the [facility] [employer] with a bad toothache.  So, see your dentist as soon as possible.  If you don't have a dentist, we can give you a list of dentists.  Sometimes the most cost effective treatment is removal of the tooth.  This can be done very inexpensively through one of the low cost Dance movement psychotherapist such as the facility on [facility] in [place] [phone]).  The downside to this is that you will have one less tooth and this can effect your ability to chew.  Some other things that can be done for a dental infection include the following:   Rinse your mouth out with hot salt water (1/2 tsp of table salt and a pinch of baking soda in 8 oz of hot water).  You can do this every 2 or 3 hours.  Avoid cold foods, beverages, and cold air.  This will make your symptoms worse.  Sleep with your head elevated.  Sleeping flat will cause your gums and oral tissues to swell and make them hurt  more.  You can sleep on several pillows.  Even better is to sleep in a recliner with your head higher than your heart.  For mild to moderate pain, you can take Tylenol, ibuprofen, or Aleve.  External application of heat by a heating pad, hot water bottle, or hot wet towel can help with pain and speed healing.  You can do this every 2 to 3 hours. Do not fall asleep on a heating pad since this can cause a burn.   If you need help with getting financial assistance, assistance in getting medications or finding a primary care physican, contact Maggy Mena here at the [facility] [place]. Her phone number is [phone].   [facility]/Internal Medicine [phone] [place] [address]  [facility]: [employer] [place]  [phone]  [place] and [facility]: [address] [place]   [phone]  [phone]  [place]: [address] [place]  [phone]  [place] primary care : [address] [place] [phone]  [facility]: [address] [place] [phone]  [facility]: [place] [phone]  Dr. Blanchie Serve [place]  [phone]  Dr. Benito Mccreedy, [facility]. [place], [place] [address]  [phone]

## 2016-02-19 ENCOUNTER — Encounter: Payer: Self-pay | Admitting: Internal Medicine

## 2016-02-19 ENCOUNTER — Ambulatory Visit (INDEPENDENT_AMBULATORY_CARE_PROVIDER_SITE_OTHER): Payer: Medicare Other | Admitting: Internal Medicine

## 2016-02-19 VITALS — BP 102/60 | HR 81 | Temp 98.0°F | Ht 64.0 in | Wt 124.0 lb

## 2016-02-19 DIAGNOSIS — Z0000 Encounter for general adult medical examination without abnormal findings: Secondary | ICD-10-CM

## 2016-02-19 DIAGNOSIS — G8929 Other chronic pain: Secondary | ICD-10-CM

## 2016-02-19 DIAGNOSIS — Z23 Encounter for immunization: Secondary | ICD-10-CM

## 2016-02-19 DIAGNOSIS — H109 Unspecified conjunctivitis: Secondary | ICD-10-CM

## 2016-02-19 DIAGNOSIS — R109 Unspecified abdominal pain: Secondary | ICD-10-CM

## 2016-02-19 DIAGNOSIS — I10 Essential (primary) hypertension: Secondary | ICD-10-CM

## 2016-02-19 MED ORDER — POLYMYXIN B-TRIMETHOPRIM 10000-0.1 UNIT/ML-% OP SOLN: 1.0000 [drp] | Freq: Four times a day (QID) | OPHTHALMIC | 0 refills | Status: DC

## 2016-02-19 MED ORDER — LISINOPRIL 5 MG PO TABS: 5.0000 mg | ORAL_TABLET | Freq: Every day | ORAL | 0 refills | Status: DC

## 2016-02-19 NOTE — Patient Instructions (Signed)
It was so nice to meet you!  I have started you on Lisinopril 5mg  daily. Please stop taking your husband's Enalapril.  I have also prescribed some antibacterial eye drops for your pink eye.  Please schedule a mammogram as soon as possible. I have referred you to a gastroenterologist for your chronic abdominal pain and to have a colonoscopy. You should hear from our office in the next 2-4 weeks to schedule this appointment.  We will see you back in 1-2 months for a blood pressure check.  -Dr. Brett Albino

## 2016-02-19 NOTE — Assessment & Plan Note (Signed)
BP 102/60 today. Currently taking Carvedilol 12.5mg  bid and husband's Enalapril once daily (she is unsure of the dose). She has been told many times while in the ED that her blood pressure is "in the danger zone". No low BPs. - Continue Coreg 12.5mg  bid - Stop husband's Enalapril and start Lisinopril 5mg  daily - Plan to check BMP at next visit in 1 month

## 2016-02-19 NOTE — Assessment & Plan Note (Signed)
-   Last Mammogram 2015- Pt given information for scheduling a mammogram - Pt received flu shot in clinic today - Referral to GI for colonoscopy - Will come back for a pap, although Pt unsure if she still has a cervix

## 2016-02-19 NOTE — Progress Notes (Signed)
   [facility] Phone: [phone]  Subjective:  [name] is a 65 year old female presenting to clinic for a new patient visit.  Chronic Abdominal Pain: This has been going on for many, many years. The pain is located in the RLQ and radiates up towards her rib. She has had her gallbladder and appendix taken out to try to help the pain, but this has not helped. She endorses both diarrhea and constipation. She endorses nausea, but denies vomiting. She endorses occasional hematochezia. She has never had a colonoscopy.  Pink Eye:. Her granddaughter was just recently diagnosed with pink eye. She has been having red eye, crusting, itching, purulent discharge in the morning. No eye pain. No changes in vision.  HTN: Currently taking Carvedilol 12.5mg  bid and husband's Enalapril once daily (she is unsure of the dose). She has been told many times while in the ED that her blood pressure is "in the danger zone". No low BPs. No dizziness. No chest pain.  ROS- Positive for R sided abdominal pain, diarrhea, constipation, nausea, occasional hematochezia. Denies vomiting, recent unintentional weight loss, fevers, chills.  Past Medical History- chronic pain, GERD, hx stomach ulcers 20 years ago, hypertension, hx migraines, hx narcotic abuse 2/2 chronic back pain, heart murmur (born with it)  Past Surgical History- Cholecystectomy 2015, appendectomy 1968, hysterectomy 1984  Family History-  -Mother- Alzheimer's disease -Father- prostate cancer, congestive heart failure -Sister- brain tumor  Social History- Current smoker, has smoked since 1975. No alcohol use. No drug use.  Current Medications- Carvedilol 12.5mg  bid, Methadone 90mg  daily, taking husband's Enalapril once daily  Objective: BP 102/60   Pulse 81   Temp 98 F (36.7 C) (Oral)   Ht 5\' 4"  (1.626 m)   Wt 124 lb (56.2 kg)   SpO2 99%   BMI 21.28 kg/m  Gen: NAD, alert, cooperative with exam HEENT: NCAT, EOMI, MMM,  conjunctival injection of right eye, no pain with eye movement, visual acuity grossly intact, no loss of visual fields, crusting present in the lower lashes, small amount of purulent discharge present. Neck: FROM, supple CV: RRR, no murmur Resp: CTABL, no wheezes, normal work of breathing GI: SNTND, BS present, no guarding or organomegaly Msk: No edema, warm, normal tone, moves UE/LE spontaneously Neuro: Alert and oriented, no gross deficits Skin: No rashes, no lesions Psych: Appropriate behavior  Assessment/Plan: Chronic Abdominal Pain: Has been going on for many years. Has had multiple abdominal surgeries. Has never had a colonoscopy. - Referral to GI for colonoscopy and to establish care  Bacterial Conjunctivitis: Pt's granddaughter just diagnosed with pink eye. Pt now having right eye redness, purulent discharge, crusting. No red flags suggesting ophthalmologic emergency. - Will treat for possible bacterial conjunctivitis with Trimethoprim-Polymixin drops  HTN: BP 102/60 today. Currently taking Carvedilol 12.5mg  bid and husband's Enalapril once daily (she is unsure of the dose). She has been told many times while in the ED that her blood pressure is "in the danger zone". No low BPs. - Continue Coreg 12.5mg  bid - Stop husband's Enalapril and start Lisinopril 5mg  daily - Plan to check BMP at next visit in 1 month  Health Care Maintenance: - Last Mammogram 2015- Pt given information for scheduling a mammogram - Pt received flu shot in clinic today - Referral to GI for colonoscopy - Will come back for a pap, although Pt unsure if she still has a cervix  Hyman Bible, MD PGY-2

## 2016-02-19 NOTE — Assessment & Plan Note (Signed)
Pt's granddaughter just diagnosed with pink eye. Pt now having right eye redness, purulent discharge, crusting. No red flags suggesting ophthalmologic emergency. - Will treat for possible bacterial conjunctivitis with Trimethoprim-Polymixin drops

## 2016-02-19 NOTE — Assessment & Plan Note (Signed)
Has been going on for many years. Has had multiple abdominal surgeries. Has never had a colonoscopy. - Referral to GI for colonoscopy and to establish care

## 2016-02-21 ENCOUNTER — Encounter: Payer: Self-pay | Admitting: Internal Medicine

## 2016-03-31 ENCOUNTER — Emergency Department (HOSPITAL_COMMUNITY)
Admission: EM | Admit: 2016-03-31 | Discharge: 2016-04-01 | Disposition: A | Discharge status: Home / Self Care | Payer: Medicare Other | Attending: Emergency Medicine | Admitting: Emergency Medicine

## 2016-03-31 ENCOUNTER — Emergency Department (HOSPITAL_COMMUNITY): Payer: Medicare Other

## 2016-03-31 ENCOUNTER — Encounter (HOSPITAL_COMMUNITY): Payer: Self-pay | Admitting: Emergency Medicine

## 2016-03-31 DIAGNOSIS — Z79899 Other long term (current) drug therapy: Secondary | ICD-10-CM | POA: Insufficient documentation

## 2016-03-31 DIAGNOSIS — R112 Nausea with vomiting, unspecified: Secondary | ICD-10-CM | POA: Insufficient documentation

## 2016-03-31 DIAGNOSIS — M542 Cervicalgia: Secondary | ICD-10-CM | POA: Diagnosis not present

## 2016-03-31 DIAGNOSIS — F17210 Nicotine dependence, cigarettes, uncomplicated: Secondary | ICD-10-CM | POA: Insufficient documentation

## 2016-03-31 DIAGNOSIS — I10 Essential (primary) hypertension: Secondary | ICD-10-CM | POA: Insufficient documentation

## 2016-03-31 LAB — CBC
HCT: 38.7 % (ref 36.0–46.0)
Hemoglobin: 13.6 g/dL (ref 12.0–15.0)
MCH: 33.7 pg (ref 26.0–34.0)
MCHC: 35.1 g/dL (ref 30.0–36.0)
MCV: 96 fL (ref 78.0–100.0)
Platelets: 437 10*3/uL — ABNORMAL HIGH (ref 150–400)
RBC: 4.03 MIL/uL (ref 3.87–5.11)
RDW: 14 % (ref 11.5–15.5)
WBC: 15.5 10*3/uL — ABNORMAL HIGH (ref 4.0–10.5)

## 2016-03-31 LAB — COMPREHENSIVE METABOLIC PANEL
ALT: 103 U/L — ABNORMAL HIGH (ref 14–54)
AST: 117 U/L — ABNORMAL HIGH (ref 15–41)
Albumin: 4.8 g/dL (ref 3.5–5.0)
Alkaline Phosphatase: 125 U/L (ref 38–126)
Anion gap: 11 (ref 5–15)
BUN: 14 mg/dL (ref 6–20)
CO2: 22 mmol/L (ref 22–32)
Calcium: 9.6 mg/dL (ref 8.9–10.3)
Chloride: 103 mmol/L (ref 101–111)
Creatinine, Ser: 0.76 mg/dL (ref 0.44–1.00)
GFR calc Af Amer: >60 mL/min (ref >60)
GFR calc non Af Amer: >60 mL/min (ref >60)
Glucose, Bld: 112 mg/dL — ABNORMAL HIGH (ref 65–99)
Potassium: 3.4 mmol/L — ABNORMAL LOW (ref 3.5–5.1)
Sodium: 136 mmol/L (ref 135–145)
Total Bilirubin: 0.5 mg/dL (ref 0.3–1.2)
Total Protein: 7.9 g/dL (ref 6.5–8.1)

## 2016-03-31 LAB — URINALYSIS, ROUTINE W REFLEX MICROSCOPIC
Bilirubin Urine: NEGATIVE
Glucose, UA: NEGATIVE mg/dL
Ketones, ur: NEGATIVE mg/dL
Leukocytes, UA: NEGATIVE
Nitrite: NEGATIVE
Protein, ur: 100 mg/dL — AB
Specific Gravity, Urine: 1.024 (ref 1.005–1.030)
pH: 5 (ref 5.0–8.0)

## 2016-03-31 LAB — LIPASE, BLOOD: Lipase: 23 U/L (ref 11–51)

## 2016-03-31 MED ORDER — FENTANYL CITRATE (PF) 100 MCG/2ML IJ SOLN
50.0000 ug | Freq: Once | INTRAMUSCULAR | Status: DC
  Filled 2016-03-31: qty 2

## 2016-03-31 MED ORDER — ONDANSETRON HCL 4 MG/2ML IJ SOLN
4.0000 mg | Freq: Once | INTRAMUSCULAR | Status: AC
  Administered 2016-03-31: 4 mg via INTRAVENOUS
  Filled 2016-03-31: qty 2

## 2016-03-31 MED ORDER — HYDROMORPHONE HCL 1 MG/ML IJ SOLN
1.0000 mg | Freq: Once | INTRAMUSCULAR | Status: AC
  Administered 2016-03-31: 1 mg via INTRAVENOUS
  Filled 2016-03-31: qty 1

## 2016-03-31 MED ORDER — DIAZEPAM 5 MG/ML IJ SOLN
5.0000 mg | Freq: Once | INTRAMUSCULAR | Status: AC
  Administered 2016-03-31: 5 mg via INTRAVENOUS
  Filled 2016-03-31: qty 2

## 2016-03-31 MED ORDER — IOPAMIDOL (ISOVUE-370) INJECTION 76%
INTRAVENOUS | Status: AC
  Administered 2016-03-31
  Administered 2016-03-31: 100 mL via INTRAVENOUS
  Filled 2016-03-31: qty 100

## 2016-03-31 MED ORDER — MORPHINE SULFATE (PF) 4 MG/ML IV SOLN
4.0000 mg | Freq: Once | INTRAVENOUS | Status: AC
  Administered 2016-03-31: 4 mg via INTRAVENOUS
  Filled 2016-03-31: qty 1

## 2016-03-31 MED ORDER — SODIUM CHLORIDE 0.9 % IV BOLUS (SEPSIS)
1000.0000 mL | Freq: Once | INTRAVENOUS | Status: AC
  Administered 2016-03-31: 1000 mL via INTRAVENOUS

## 2016-03-31 NOTE — ED Triage Notes (Addendum)
Patient c/o vomiting that started around 2 am this morning. Then while she was vomiting starting having really bad pains in neck, arms and states "I cant move!"

## 2016-04-01 MED ORDER — CYCLOBENZAPRINE HCL 10 MG PO TABS: 10.0000 mg | ORAL_TABLET | Freq: Two times a day (BID) | ORAL | 0 refills | Status: DC | PRN

## 2016-04-01 MED ORDER — PREDNISONE 10 MG (21) PO TBPK: 10.0000 mg | ORAL_TABLET | Freq: Every day | ORAL | 0 refills | Status: DC

## 2016-04-01 NOTE — ED Provider Notes (Signed)
[facility] DEPT Provider Note   CSN: [id number] Arrival date & time: 03/31/16  1747     History   Chief Complaint Chief Complaint  Patient presents with  . Emesis  . Generalized Body Aches    HPI [name] is a 66 y.o. female.  HPI 66 year old female who presents with nausea, vomiting, neck pain. She has a history of arthritis, fibromyalgia, narcotic abuse, and hypertension. States that she has had mild flulike symptoms 1 week ago with productive cough and congestion. The symptoms improved, but yesterday developed nausea and vomiting. Has not had diarrhea, dysuria or urinary frequency. With mild generalized abdominal discomfort. We'll vomiting today had sudden onset of neck pain with shooting pain down the left arm. He has not had focal numbness or weakness, vision or speech changes, vertigo or ataxia.  Past Medical History:  Diagnosis Date  . Arrhythmia   . Arthritis   . Fibromyalgia   . GERD (gastroesophageal reflux disease)   . H/O: substance abuse 2005   narcotic usage due to chronic back pain  . Heart murmur   . Hypertension   . PONV (postoperative nausea and vomiting)     Patient Active Problem List   Diagnosis Date Noted  . Chronic abdominal pain 02/19/2016  . Essential hypertension 02/19/2016  . Bacterial conjunctivitis 02/19/2016  . Health care maintenance 02/19/2016  . ANXIETY 05/08/2006  . TOBACCO DEPENDENCE 05/08/2006  . ASTHMA, UNSPECIFIED 05/08/2006  . GASTROESOPHAGEAL REFLUX, NO ESOPHAGITIS 05/08/2006  . CONSTIPATION 05/08/2006  . CONVULSIONS, SEIZURES, NOS 05/08/2006    Past Surgical History:  Procedure Laterality Date  . ABDOMINAL HYSTERECTOMY    . ABDOMINAL SURGERY    . APPENDECTOMY    . BREAST SURGERY    . CHOLECYSTECTOMY N/A 09/30/2012   Procedure: LAPAROSCOPIC CHOLECYSTECTOMY WITH INTRAOPERATIVE CHOLANGIOGRAM;  Surgeon: Imogene Burn. Georgette Dover, MD;  Location: [facility] ORS;  Service: General;  Laterality: N/A;  . FRACTURE SURGERY     right upper  arm  . OVARIAN CYST SURGERY      OB History    No data available       Home Medications    Prior to Admission medications   Medication Sig Start Date End Date Taking? Authorizing Provider  acetaminophen (TYLENOL) 500 MG tablet Take 1,000 mg by mouth 3 (three) times daily as needed for moderate pain.    Yes Historical Provider, MD  carvedilol (COREG) 12.5 MG tablet Take 12.5 mg by mouth daily.    Yes Historical Provider, MD  Hyprom-Naphaz-Polysorb-Zn Sulf (CLEAR EYES COMPLETE OP) Apply 2 drops to eye daily.   Yes Historical Provider, MD  lisinopril (PRINIVIL,ZESTRIL) 5 MG tablet Take 1 tablet (5 mg total) by mouth daily. 02/19/16  Yes Sela Hua, MD  methadone (DOLOPHINE) 10 MG/ML solution Take 90 mg by mouth daily.    Yes Historical Provider, MD  chlorhexidine (PERIDEX) 0.12 % solution 15 mL swish and spit bid Patient not taking: Reported on 03/31/2016 09/01/15   Melynda Ripple, MD  cyclobenzaprine (FLEXERIL) 10 MG tablet Take 1 tablet (10 mg total) by mouth 2 (two) times daily as needed for muscle spasms. 04/01/16   Forde Dandy, MD  lidocaine (XYLOCAINE) 2 % solution Use as directed 10 mLs in the mouth or throat every 6 (six) hours as needed for mouth pain. Hold in mouth and spit. Do not swallow. Patient not taking: Reported on 03/31/2016 09/01/15   Melynda Ripple, MD  ondansetron [facility] ODT) 8 MG disintegrating tablet Take 1 tablet (8 mg total)  by mouth every 8 (eight) hours as needed for nausea or vomiting. Patient not taking: Reported on 03/31/2016 09/01/15   Melynda Ripple, MD  predniSONE (STERAPRED UNI-PAK 21 TAB) 10 MG (21) TBPK tablet Take 1 tablet (10 mg total) by mouth daily. Take 6 tabs by mouth daily  for 2 days, then 5 tabs for 2 days, then 4 tabs for 2 days, then 3 tabs for 2 days, 2 tabs for 2 days, then 1 tab by mouth daily for 2 days 04/01/16   Forde Dandy, MD  trimethoprim-polymyxin b (POLYTRIM) ophthalmic solution Place 1 drop into both eyes every 6 (six)  hours. Patient not taking: Reported on 03/31/2016 02/19/16   Sela Hua, MD    Family History Family History  Problem Relation Age of Onset  . Alzheimer's disease Mother   . Cancer Father     prostate  . Heart disease Father     Social History Social History  Substance Use Topics  . Smoking status: Current Every Day Smoker    Packs/day: 1.00    Years: 30.00    Types: Cigarettes  . Smokeless tobacco: Never Used  . Alcohol use No     Allergies   Codeine and Nsaids   Review of Systems Review of Systems 10/14 systems reviewed and are negative other than those stated in the HPI   Physical Exam Updated Vital Signs BP 159/91 (BP Location: Right Arm)   Pulse 115   Temp 98.2 F (36.8 C) (Oral)   Resp 18   Ht 5\' 4"  (1.626 m)   Wt 124 lb (56.2 kg)   SpO2 100%   BMI 21.28 kg/m   Physical Exam  Physical Exam  Nursing note and vitals reviewed. Constitutional: Well developed, well nourished, non-toxic, and in no acute distress Head: Normocephalic and atraumatic.  Mouth/Throat: Oropharynx is clear and moist.  Neck: Neck supple. Tenderness to palpation diffusely over cervical spine. Cardiovascular: Tachycardic rate and regular rhythm.   +2 DP pulses, +2 radial pulses bilaterally Pulmonary/Chest: Effort normal and breath sounds normal.  Abdominal: Soft. There is no tenderness. There is no rebound and no guarding.  Musculoskeletal: Normal range of motion.  Neurological: Alert, no facial droop, fluent speech, moves all extremities symmetrically, full strength in bilateral upper extremities, sensation to light touch intact throughout, pupils equal reactive to light, normal non-ataxic gait Skin: Skin is warm and dry.  Psychiatric: Cooperative  ED Treatments / Results  Labs (all labs ordered are listed, but only abnormal results are displayed) Labs Reviewed  COMPREHENSIVE METABOLIC PANEL - Abnormal; Notable for the following:       Result Value   Potassium 3.4 (*)     Glucose, Bld 112 (*)    AST 117 (*)    ALT 103 (*)    All other components within normal limits  CBC - Abnormal; Notable for the following:    WBC 15.5 (*)    Platelets 437 (*)    All other components within normal limits  URINALYSIS, ROUTINE W REFLEX MICROSCOPIC - Abnormal; Notable for the following:    APPearance HAZY (*)    Hgb urine dipstick LARGE (*)    Protein, ur 100 (*)    Bacteria, UA MANY (*)    Squamous Epithelial / LPF 6-30 (*)    All other components within normal limits  LIPASE, BLOOD    EKG  EKG Interpretation None       Radiology Ct Angio Head W Or Wo Contrast  Result  Date: 04/01/2016 CLINICAL DATA:  66 y/o F; for skull vomiting with sharp left-sided neck pain. EXAM: CT ANGIOGRAPHY HEAD AND NECK TECHNIQUE: Multidetector CT imaging of the head and neck was performed using the standard protocol during bolus administration of intravenous contrast. Multiplanar CT image reconstructions and MIPs were obtained to evaluate the vascular anatomy. Carotid stenosis measurements (when applicable) are obtained utilizing NASCET criteria, using the distal internal carotid diameter as the denominator. CONTRAST:  100 cc Isovue 370 COMPARISON:  None. FINDINGS: CT HEAD FINDINGS Brain: No evidence of acute infarction, hemorrhage, hydrocephalus, extra-axial collection or mass lesion/mass effect. Vascular: As below. Skull: Normal. Negative for fracture or focal lesion. Sinuses: Ethmoid and maxillary sinus mucosal thickening and right maxillary sinus nonspecific fluid level. Orbits are unremarkable. Normally aerated mastoid air cells. Orbits: No acute finding. Review of the MIP images confirms the above findings CTA NECK FINDINGS Aortic arch: Aortic atherosclerosis with mild arch calcification. Three vessel arch. Right carotid system: No evidence of dissection, stenosis (50% or greater) or occlusion. Brief retropharyngeal submucosal proximal ICA. Left carotid system: No evidence of dissection,  stenosis (50% or greater) or occlusion. Vertebral arteries: Right dominant. No evidence of dissection, stenosis (50% or greater) or occlusion. Skeleton: Mild cervical spondylosis greatest at the C5 through C7 levels with there are disc osteophyte complexes and disc space narrowing. Multilevel facet degenerative changes greater on the left. No high-grade bony canal stenosis. Other neck: Subcentimeter thyroid nodules in the right lobe of thyroid. Upper chest: Mild biapical pleuroparenchymal scarring of the lungs. Review of the MIP images confirms the above findings CTA HEAD FINDINGS Anterior circulation: No significant stenosis, proximal occlusion, aneurysm, or vascular malformation. Prominent infundibular origin of the left posterior communicating artery (series 16, image 107). Mild bilateral ICA irregularity and calcification of right paraclinoid ICA with mild bilateral paraclinoid stenosis compatible with atherosclerosis. Posterior circulation: No significant stenosis, proximal occlusion, aneurysm, or vascular malformation. Venous sinuses: As permitted by contrast timing, patent. Small right cerebellar developmental venous anomaly. Anatomic variants: Small anterior and bilateral posterior communicating arteries are present. Delayed phase: No abnormal intracranial enhancement. Review of the MIP images confirms the above findings IMPRESSION: 1. No acute intracranial abnormality on noncontrast CT of the head. No abnormal enhancement of the brain. 2. Paranasal sinus disease with a right maxillary fluid level which may represent acute sinusitis in the appropriate clinical setting. 3. Patent carotid and vertebral arteries of the neck without evidence for dissection, hemodynamically significant stenosis, or occlusion. 4. Patent circle of Willis without evidence for proximal occlusion, high-grade stenosis, or aneurysm. 5. Right cerebellar developmental venous anomaly. 6. Bilateral ICA atherosclerosis with mild paraclinoid  stenosis. Electronically Signed   By: Kristine Garbe M.D.   On: 04/01/2016 00:00   Ct Angio Neck W And/or Wo Contrast  Result Date: 04/01/2016 CLINICAL DATA:  66 y/o F; for skull vomiting with sharp left-sided neck pain. EXAM: CT ANGIOGRAPHY HEAD AND NECK TECHNIQUE: Multidetector CT imaging of the head and neck was performed using the standard protocol during bolus administration of intravenous contrast. Multiplanar CT image reconstructions and MIPs were obtained to evaluate the vascular anatomy. Carotid stenosis measurements (when applicable) are obtained utilizing NASCET criteria, using the distal internal carotid diameter as the denominator. CONTRAST:  100 cc Isovue 370 COMPARISON:  None. FINDINGS: CT HEAD FINDINGS Brain: No evidence of acute infarction, hemorrhage, hydrocephalus, extra-axial collection or mass lesion/mass effect. Vascular: As below. Skull: Normal. Negative for fracture or focal lesion. Sinuses: Ethmoid and maxillary sinus mucosal thickening and right maxillary  sinus nonspecific fluid level. Orbits are unremarkable. Normally aerated mastoid air cells. Orbits: No acute finding. Review of the MIP images confirms the above findings CTA NECK FINDINGS Aortic arch: Aortic atherosclerosis with mild arch calcification. Three vessel arch. Right carotid system: No evidence of dissection, stenosis (50% or greater) or occlusion. Brief retropharyngeal submucosal proximal ICA. Left carotid system: No evidence of dissection, stenosis (50% or greater) or occlusion. Vertebral arteries: Right dominant. No evidence of dissection, stenosis (50% or greater) or occlusion. Skeleton: Mild cervical spondylosis greatest at the C5 through C7 levels with there are disc osteophyte complexes and disc space narrowing. Multilevel facet degenerative changes greater on the left. No high-grade bony canal stenosis. Other neck: Subcentimeter thyroid nodules in the right lobe of thyroid. Upper chest: Mild biapical  pleuroparenchymal scarring of the lungs. Review of the MIP images confirms the above findings CTA HEAD FINDINGS Anterior circulation: No significant stenosis, proximal occlusion, aneurysm, or vascular malformation. Prominent infundibular origin of the left posterior communicating artery (series 16, image 107). Mild bilateral ICA irregularity and calcification of right paraclinoid ICA with mild bilateral paraclinoid stenosis compatible with atherosclerosis. Posterior circulation: No significant stenosis, proximal occlusion, aneurysm, or vascular malformation. Venous sinuses: As permitted by contrast timing, patent. Small right cerebellar developmental venous anomaly. Anatomic variants: Small anterior and bilateral posterior communicating arteries are present. Delayed phase: No abnormal intracranial enhancement. Review of the MIP images confirms the above findings IMPRESSION: 1. No acute intracranial abnormality on noncontrast CT of the head. No abnormal enhancement of the brain. 2. Paranasal sinus disease with a right maxillary fluid level which may represent acute sinusitis in the appropriate clinical setting. 3. Patent carotid and vertebral arteries of the neck without evidence for dissection, hemodynamically significant stenosis, or occlusion. 4. Patent circle of Willis without evidence for proximal occlusion, high-grade stenosis, or aneurysm. 5. Right cerebellar developmental venous anomaly. 6. Bilateral ICA atherosclerosis with mild paraclinoid stenosis. Electronically Signed   By: Kristine Garbe M.D.   On: 04/01/2016 00:00    Procedures Procedures (including critical care time)  Medications Ordered in ED Medications  sodium chloride 0.9 % bolus 1,000 mL (0 mLs Intravenous Stopped 03/31/16 2317)  morphine 4 MG/ML injection 4 mg (4 mg Intravenous Given 03/31/16 2231)  ondansetron (ZOFRAN) injection 4 mg (4 mg Intravenous Given 03/31/16 2229)  diazepam (VALIUM) injection 5 mg (5 mg Intravenous  Given 03/31/16 2230)  iopamidol (ISOVUE-370) 76 % injection (  Contrast Given 03/31/16 2346)  HYDROmorphone (DILAUDID) injection 1 mg (1 mg Intravenous Given 03/31/16 2320)     Initial Impression / Assessment and Plan / ED Course  I have reviewed the triage vital signs and the nursing notes.  Pertinent labs & imaging results that were available during my care of the patient were reviewed by me and considered in my medical decision making (see chart for details).     66 year old female who presents with neck pain in the setting of nausea and vomiting. Appears to be in significant pain on my evaluation, stating that she is unable to move her body even from a lying to sitting position. However just prior to me seeing her, I did see her walk from the bathroom to her room without any difficulty. At times tearful and wailing whenever I evaluate, and other times comfortable and calm when it is just her and her husband in the room.   She has a normal neurological exam. Remainder of exam is unremarkable and nonfocal. Differential does include potential radiculopathy versus neck strain  versus carotid or vertebral artery dissection. Obtained a CT angiogram of the head and neck. This is visualized and shows no evidence of acute intracranial processes, cervical spine processes, or carotid/vertebral artery dissection. Possibility that this may be radiculopathy, but she has a normal neurological exam, no bladder dysfunction, and I do not feel that she requires MRI at this time.  She does have a neurosurgeon that she has followed with in the past who she will follow-up with. She is given steroid taper and muscle relaxants for symptomatic control. Patient and husband very upset that they are not discharged with narcotic medications. Discussed that it is not indicated at this time and she will follow-up with her outpatient doctors regarding pain management.  Final Clinical Impressions(s) / ED Diagnoses   Final  diagnoses:  Neck pain  Non-intractable vomiting with nausea, unspecified vomiting type    New Prescriptions Discharge Medication List as of 04/01/2016 12:31 AM    START taking these medications   Details  cyclobenzaprine (FLEXERIL) 10 MG tablet Take 1 tablet (10 mg total) by mouth 2 (two) times daily as needed for muscle spasms., Starting Mon 04/01/2016, Print    predniSONE (STERAPRED UNI-PAK 21 TAB) 10 MG (21) TBPK tablet Take 1 tablet (10 mg total) by mouth daily. Take 6 tabs by mouth daily  for 2 days, then 5 tabs for 2 days, then 4 tabs for 2 days, then 3 tabs for 2 days, 2 tabs for 2 days, then 1 tab by mouth daily for 2 days, Starting Mon 04/01/2016, Print         Forde Dandy, MD 04/01/16 [phone]

## 2016-04-01 NOTE — Discharge Instructions (Signed)
The CT scan of the neck does now show serious cause of your pain.  This may be herniated disc versus muscle strain.  Please call your neurosurgeon and PCP for follow-up.

## 2016-04-03 ENCOUNTER — Telehealth: Payer: Self-pay | Admitting: Internal Medicine

## 2016-04-03 NOTE — Telephone Encounter (Signed)
Pt's previous gi referral closed because the gi office could not get in touch with pt. Pt needs another referral. [phone] is pt's number. Husband Coralyn Mark [phone]. ep

## 2016-04-04 ENCOUNTER — Encounter: Payer: Self-pay | Admitting: Internal Medicine

## 2016-04-04 ENCOUNTER — Ambulatory Visit (INDEPENDENT_AMBULATORY_CARE_PROVIDER_SITE_OTHER): Payer: Medicare Other | Admitting: Internal Medicine

## 2016-04-04 DIAGNOSIS — M542 Cervicalgia: Secondary | ICD-10-CM

## 2016-04-04 MED ORDER — TRAMADOL HCL 50 MG PO TABS: 50.0000 mg | ORAL_TABLET | Freq: Three times a day (TID) | ORAL | 0 refills | Status: DC | PRN

## 2016-04-04 NOTE — Telephone Encounter (Signed)
Spoke with pt and informed her her referral is good for a year and she should be able to call them and set up her own apt. I gave pt the number for the GI office.

## 2016-04-04 NOTE — Patient Instructions (Addendum)
I will prescribe a short term course of Tramadol to help with your pain. This is not a long term medication.   Continue with the Flexeril and the Tylenol. Alternate ice and heat on your neck multiple times per day.   Wear the C-spine collar when you are up and active to help with supporting your neck. You can get one at a medical home supply store.   Please follow up with Dr. Brett Albino in one to two weeks.

## 2016-04-04 NOTE — Progress Notes (Signed)
   Subjective:    Talayla Westby - 66 y.o. female MRN [id number]  Date of birth: 31-Jan-1951  HPI  Nadera Hollinger is here for SDA for neck pain.  Neck Pain: Started about 5 days ago after an evening of vomiting. Pain radiates down her left arm described as muscle spasms down the arm. Pain is bilateral on both sides of her neck and also radiates into her upper back. She has significantly limited ROM due to pain. Denies weakness or numbness. Denies bowel or bladder incontinence. Denies vision and speech changes. She is using Flexeril and is currently on a Prednisone taper as prescribed by ED. She is taking Tylenol OTC. Has not tried heat or ice.   -  reports that she has been smoking Cigarettes.  She has a 30.00 pack-year smoking history. She has never used smokeless tobacco. - Review of Systems: Per HPI. - Past Medical History: Patient Active Problem List   Diagnosis Date Noted  . Neck pain 04/04/2016  . Chronic abdominal pain 02/19/2016  . Essential hypertension 02/19/2016  . Bacterial conjunctivitis 02/19/2016  . Health care maintenance 02/19/2016  . ANXIETY 05/08/2006  . TOBACCO DEPENDENCE 05/08/2006  . ASTHMA, UNSPECIFIED 05/08/2006  . GASTROESOPHAGEAL REFLUX, NO ESOPHAGITIS 05/08/2006  . CONSTIPATION 05/08/2006  . CONVULSIONS, SEIZURES, NOS 05/08/2006   - Medications: reviewed and updated   Objective:   Physical Exam BP (!) 158/87   Pulse (!) 137   Temp 98 F (36.7 C) (Oral)   Wt 121 lb (54.9 kg)   BMI 20.77 kg/m  Gen: NAD, alert, cooperative with exam, well-appearing, holding neck in flexion  MSK: TTP over cervical spine and paraspinal muscles. Paraspinal muscles feel ropy and tight bilaterally. Limited active ROM in rotation and extension of cervical spine.  Neuro: CN II-XII intact. Sensation to all 4 extremities equal and intact. Strength 5/5 in all 4 extremities. Slowed, but normal gait. Normal speech.   Assessment & Plan:   Neck pain Likely acute muscle spasm/strain from  vomiting. CT neck only positive for mild cervical spondylosis greatest at the C5 through C7 with some disc space narrowing which were present on C-spine imaging in 2008. Gave short term course of Tramadol given patient's inability to tolerate NSAIDs due to GI ulcers. Gave prescription for cervical spine collar. Recommended ice and heat to the area. Continue with Flexeril and Prednisone. Follow up with PCP in 1-2 weeks. If still not improved would refer to PT. Could also consider follow up with neurosurgeon as patient has been seen by neurosurgery in the past.      Phill Myron, D.O. 04/04/2016, 3:38 PM PGY-2, [facility]

## 2016-04-04 NOTE — Assessment & Plan Note (Signed)
Likely acute muscle spasm/strain from vomiting. CT neck only positive for mild cervical spondylosis greatest at the C5 through C7 with some disc space narrowing which were present on C-spine imaging in 2008. Gave short term course of Tramadol given patient's inability to tolerate NSAIDs due to GI ulcers. Gave prescription for cervical spine collar. Recommended ice and heat to the area. Continue with Flexeril and Prednisone. Follow up with PCP in 1-2 weeks. If still not improved would refer to PT. Could also consider follow up with neurosurgeon as patient has been seen by neurosurgery in the past.

## 2016-04-08 DIAGNOSIS — M542 Cervicalgia: Secondary | ICD-10-CM | POA: Diagnosis not present

## 2016-04-08 DIAGNOSIS — M4722 Other spondylosis with radiculopathy, cervical region: Secondary | ICD-10-CM | POA: Diagnosis not present

## 2016-04-08 DIAGNOSIS — M25552 Pain in left hip: Secondary | ICD-10-CM | POA: Diagnosis not present

## 2016-04-12 ENCOUNTER — Inpatient Hospital Stay (HOSPITAL_COMMUNITY)
Admission: EM | Admit: 2016-04-12 | Discharge: 2016-04-23 | DRG: 870 | Disposition: A | Discharge status: Home Health | Payer: Medicare Other | Attending: Internal Medicine | Admitting: Internal Medicine

## 2016-04-12 ENCOUNTER — Other Ambulatory Visit: Payer: Self-pay

## 2016-04-12 ENCOUNTER — Encounter (HOSPITAL_COMMUNITY): Payer: Self-pay | Admitting: *Deleted

## 2016-04-12 ENCOUNTER — Ambulatory Visit (HOSPITAL_COMMUNITY): Payer: Medicare Other

## 2016-04-12 ENCOUNTER — Inpatient Hospital Stay (HOSPITAL_COMMUNITY): Payer: Medicare Other

## 2016-04-12 ENCOUNTER — Emergency Department (HOSPITAL_COMMUNITY): Payer: Medicare Other

## 2016-04-12 DIAGNOSIS — Z886 Allergy status to analgesic agent status: Secondary | ICD-10-CM

## 2016-04-12 DIAGNOSIS — I340 Nonrheumatic mitral (valve) insufficiency: Secondary | ICD-10-CM | POA: Diagnosis not present

## 2016-04-12 DIAGNOSIS — G001 Pneumococcal meningitis: Secondary | ICD-10-CM | POA: Diagnosis present

## 2016-04-12 DIAGNOSIS — G894 Chronic pain syndrome: Secondary | ICD-10-CM | POA: Diagnosis not present

## 2016-04-12 DIAGNOSIS — Z8042 Family history of malignant neoplasm of prostate: Secondary | ICD-10-CM

## 2016-04-12 DIAGNOSIS — B373 Candidiasis of vulva and vagina: Secondary | ICD-10-CM | POA: Diagnosis present

## 2016-04-12 DIAGNOSIS — R509 Fever, unspecified: Secondary | ICD-10-CM

## 2016-04-12 DIAGNOSIS — R739 Hyperglycemia, unspecified: Secondary | ICD-10-CM | POA: Diagnosis present

## 2016-04-12 DIAGNOSIS — Z720 Tobacco use: Secondary | ICD-10-CM

## 2016-04-12 DIAGNOSIS — G0490 Encephalitis and encephalomyelitis, unspecified: Secondary | ICD-10-CM | POA: Diagnosis present

## 2016-04-12 DIAGNOSIS — R7881 Bacteremia: Secondary | ICD-10-CM

## 2016-04-12 DIAGNOSIS — F1910 Other psychoactive substance abuse, uncomplicated: Secondary | ICD-10-CM | POA: Diagnosis not present

## 2016-04-12 DIAGNOSIS — I639 Cerebral infarction, unspecified: Secondary | ICD-10-CM | POA: Diagnosis present

## 2016-04-12 DIAGNOSIS — M150 Primary generalized (osteo)arthritis: Secondary | ICD-10-CM | POA: Diagnosis not present

## 2016-04-12 DIAGNOSIS — I10 Essential (primary) hypertension: Secondary | ICD-10-CM | POA: Diagnosis present

## 2016-04-12 DIAGNOSIS — M797 Fibromyalgia: Secondary | ICD-10-CM | POA: Diagnosis present

## 2016-04-12 DIAGNOSIS — I6359 Cerebral infarction due to unspecified occlusion or stenosis of other cerebral artery: Secondary | ICD-10-CM

## 2016-04-12 DIAGNOSIS — R4182 Altered mental status, unspecified: Secondary | ICD-10-CM | POA: Diagnosis not present

## 2016-04-12 DIAGNOSIS — I8290 Acute embolism and thrombosis of unspecified vein: Secondary | ICD-10-CM | POA: Diagnosis not present

## 2016-04-12 DIAGNOSIS — G08 Intracranial and intraspinal phlebitis and thrombophlebitis: Secondary | ICD-10-CM | POA: Diagnosis not present

## 2016-04-12 DIAGNOSIS — Z90710 Acquired absence of both cervix and uterus: Secondary | ICD-10-CM

## 2016-04-12 DIAGNOSIS — R000 Tachycardia, unspecified: Secondary | ICD-10-CM | POA: Diagnosis present

## 2016-04-12 DIAGNOSIS — I160 Hypertensive urgency: Secondary | ICD-10-CM | POA: Diagnosis present

## 2016-04-12 DIAGNOSIS — R6520 Severe sepsis without septic shock: Secondary | ICD-10-CM

## 2016-04-12 DIAGNOSIS — D62 Acute posthemorrhagic anemia: Secondary | ICD-10-CM | POA: Diagnosis not present

## 2016-04-12 DIAGNOSIS — A419 Sepsis, unspecified organism: Secondary | ICD-10-CM | POA: Diagnosis not present

## 2016-04-12 DIAGNOSIS — Z8249 Family history of ischemic heart disease and other diseases of the circulatory system: Secondary | ICD-10-CM

## 2016-04-12 DIAGNOSIS — Z4682 Encounter for fitting and adjustment of non-vascular catheter: Secondary | ICD-10-CM | POA: Diagnosis not present

## 2016-04-12 DIAGNOSIS — H4902 Third [oculomotor] nerve palsy, left eye: Secondary | ICD-10-CM | POA: Diagnosis not present

## 2016-04-12 DIAGNOSIS — D72829 Elevated white blood cell count, unspecified: Secondary | ICD-10-CM

## 2016-04-12 DIAGNOSIS — G8194 Hemiplegia, unspecified affecting left nondominant side: Secondary | ICD-10-CM | POA: Diagnosis not present

## 2016-04-12 DIAGNOSIS — R4701 Aphasia: Secondary | ICD-10-CM | POA: Diagnosis present

## 2016-04-12 DIAGNOSIS — D509 Iron deficiency anemia, unspecified: Secondary | ICD-10-CM | POA: Diagnosis not present

## 2016-04-12 DIAGNOSIS — J9690 Respiratory failure, unspecified, unspecified whether with hypoxia or hypercapnia: Secondary | ICD-10-CM | POA: Diagnosis not present

## 2016-04-12 DIAGNOSIS — I69354 Hemiplegia and hemiparesis following cerebral infarction affecting left non-dominant side: Secondary | ICD-10-CM | POA: Diagnosis not present

## 2016-04-12 DIAGNOSIS — R402352 Coma scale, best motor response, localizes pain, at arrival to emergency department: Secondary | ICD-10-CM | POA: Diagnosis present

## 2016-04-12 DIAGNOSIS — F17210 Nicotine dependence, cigarettes, uncomplicated: Secondary | ICD-10-CM | POA: Diagnosis present

## 2016-04-12 DIAGNOSIS — G9340 Encephalopathy, unspecified: Secondary | ICD-10-CM

## 2016-04-12 DIAGNOSIS — E872 Acidosis: Secondary | ICD-10-CM | POA: Diagnosis present

## 2016-04-12 DIAGNOSIS — R4702 Dysphasia: Secondary | ICD-10-CM | POA: Diagnosis present

## 2016-04-12 DIAGNOSIS — J9811 Atelectasis: Secondary | ICD-10-CM | POA: Diagnosis not present

## 2016-04-12 DIAGNOSIS — J9601 Acute respiratory failure with hypoxia: Secondary | ICD-10-CM | POA: Diagnosis not present

## 2016-04-12 DIAGNOSIS — I720 Aneurysm of carotid artery: Secondary | ICD-10-CM | POA: Diagnosis not present

## 2016-04-12 DIAGNOSIS — G039 Meningitis, unspecified: Secondary | ICD-10-CM

## 2016-04-12 DIAGNOSIS — J9600 Acute respiratory failure, unspecified whether with hypoxia or hypercapnia: Secondary | ICD-10-CM | POA: Diagnosis not present

## 2016-04-12 DIAGNOSIS — R402242 Coma scale, best verbal response, confused conversation, at arrival to emergency department: Secondary | ICD-10-CM | POA: Diagnosis present

## 2016-04-12 DIAGNOSIS — M159 Polyosteoarthritis, unspecified: Secondary | ICD-10-CM | POA: Diagnosis present

## 2016-04-12 DIAGNOSIS — I6521 Occlusion and stenosis of right carotid artery: Secondary | ICD-10-CM | POA: Diagnosis not present

## 2016-04-12 DIAGNOSIS — R402142 Coma scale, eyes open, spontaneous, at arrival to emergency department: Secondary | ICD-10-CM | POA: Diagnosis present

## 2016-04-12 DIAGNOSIS — M549 Dorsalgia, unspecified: Secondary | ICD-10-CM | POA: Diagnosis present

## 2016-04-12 DIAGNOSIS — J690 Pneumonitis due to inhalation of food and vomit: Secondary | ICD-10-CM | POA: Diagnosis present

## 2016-04-12 DIAGNOSIS — B955 Unspecified streptococcus as the cause of diseases classified elsewhere: Secondary | ICD-10-CM | POA: Diagnosis not present

## 2016-04-12 DIAGNOSIS — G002 Streptococcal meningitis: Secondary | ICD-10-CM | POA: Diagnosis not present

## 2016-04-12 DIAGNOSIS — D649 Anemia, unspecified: Secondary | ICD-10-CM | POA: Diagnosis not present

## 2016-04-12 DIAGNOSIS — I6340 Cerebral infarction due to embolism of unspecified cerebral artery: Secondary | ICD-10-CM | POA: Insufficient documentation

## 2016-04-12 DIAGNOSIS — Z8673 Personal history of transient ischemic attack (TIA), and cerebral infarction without residual deficits: Secondary | ICD-10-CM | POA: Insufficient documentation

## 2016-04-12 DIAGNOSIS — R29818 Other symptoms and signs involving the nervous system: Secondary | ICD-10-CM | POA: Diagnosis not present

## 2016-04-12 DIAGNOSIS — Z452 Encounter for adjustment and management of vascular access device: Secondary | ICD-10-CM | POA: Diagnosis not present

## 2016-04-12 DIAGNOSIS — Z4659 Encounter for fitting and adjustment of other gastrointestinal appliance and device: Secondary | ICD-10-CM

## 2016-04-12 DIAGNOSIS — Z9049 Acquired absence of other specified parts of digestive tract: Secondary | ICD-10-CM

## 2016-04-12 DIAGNOSIS — T380X5A Adverse effect of glucocorticoids and synthetic analogues, initial encounter: Secondary | ICD-10-CM | POA: Diagnosis present

## 2016-04-12 DIAGNOSIS — Z885 Allergy status to narcotic agent status: Secondary | ICD-10-CM

## 2016-04-12 DIAGNOSIS — B953 Streptococcus pneumoniae as the cause of diseases classified elsewhere: Secondary | ICD-10-CM | POA: Diagnosis present

## 2016-04-12 DIAGNOSIS — I4891 Unspecified atrial fibrillation: Secondary | ICD-10-CM | POA: Diagnosis present

## 2016-04-12 DIAGNOSIS — Z23 Encounter for immunization: Secondary | ICD-10-CM

## 2016-04-12 DIAGNOSIS — K219 Gastro-esophageal reflux disease without esophagitis: Secondary | ICD-10-CM | POA: Diagnosis present

## 2016-04-12 DIAGNOSIS — Z820 Family history of epilepsy and other diseases of the nervous system: Secondary | ICD-10-CM

## 2016-04-12 DIAGNOSIS — I6310 Cerebral infarction due to embolism of unspecified precerebral artery: Secondary | ICD-10-CM | POA: Diagnosis not present

## 2016-04-12 DIAGNOSIS — R443 Hallucinations, unspecified: Secondary | ICD-10-CM | POA: Diagnosis not present

## 2016-04-12 DIAGNOSIS — E876 Hypokalemia: Secondary | ICD-10-CM | POA: Diagnosis not present

## 2016-04-12 DIAGNOSIS — Z9911 Dependence on respirator [ventilator] status: Secondary | ICD-10-CM | POA: Diagnosis not present

## 2016-04-12 LAB — BLOOD CULTURE ID PANEL (REFLEXED)

## 2016-04-12 LAB — I-STAT CG4 LACTIC ACID, ED
Lactic Acid, Venous: 5.58 mmol/L (ref 0.5–1.9)
Lactic Acid, Venous: 5.45 mmol/L (ref 0.5–1.9)

## 2016-04-12 LAB — I-STAT CHEM 8, ED
BUN: 39 mg/dL — ABNORMAL HIGH (ref 6–20)
Calcium, Ion: 1.15 mmol/L (ref 1.15–1.40)
Chloride: 103 mmol/L (ref 101–111)
Creatinine, Ser: 1 mg/dL (ref 0.44–1.00)
Glucose, Bld: 112 mg/dL — ABNORMAL HIGH (ref 65–99)
HCT: 41 % (ref 36.0–46.0)
Hemoglobin: 13.9 g/dL (ref 12.0–15.0)
Potassium: 4.1 mmol/L (ref 3.5–5.1)
Sodium: 139 mmol/L (ref 135–145)
TCO2: 26 mmol/L (ref 0–100)

## 2016-04-12 LAB — DIFFERENTIAL
Basophils Absolute: 0 10*3/uL (ref 0.0–0.1)
Basophils Relative: 0 %
Eosinophils Absolute: 0 10*3/uL (ref 0.0–0.7)
Eosinophils Relative: 0 %
Lymphocytes Relative: 4 %
Lymphs Abs: 1.2 10*3/uL (ref 0.7–4.0)
Monocytes Absolute: 0.6 10*3/uL (ref 0.1–1.0)
Monocytes Relative: 2 %
Neutro Abs: 27.8 10*3/uL — ABNORMAL HIGH (ref 1.7–7.7)
Neutrophils Relative %: 94 %

## 2016-04-12 LAB — RAPID URINE DRUG SCREEN, HOSP PERFORMED
Amphetamines: NOT DETECTED
Barbiturates: NOT DETECTED
Benzodiazepines: POSITIVE — AB
Cocaine: NOT DETECTED
Opiates: POSITIVE — AB
Tetrahydrocannabinol: NOT DETECTED

## 2016-04-12 LAB — URINALYSIS, ROUTINE W REFLEX MICROSCOPIC
Bilirubin Urine: NEGATIVE
Glucose, UA: NEGATIVE mg/dL
Ketones, ur: NEGATIVE mg/dL
Leukocytes, UA: NEGATIVE
Nitrite: NEGATIVE
Protein, ur: NEGATIVE mg/dL
Specific Gravity, Urine: 1.019 (ref 1.005–1.030)
Squamous Epithelial / LPF: NONE SEEN
pH: 5 (ref 5.0–8.0)

## 2016-04-12 LAB — CBC
HCT: 37.9 % (ref 36.0–46.0)
Hemoglobin: 13.1 g/dL (ref 12.0–15.0)
MCH: 32.8 pg (ref 26.0–34.0)
MCHC: 34.6 g/dL (ref 30.0–36.0)
MCV: 95 fL (ref 78.0–100.0)
Platelets: 505 10*3/uL — ABNORMAL HIGH (ref 150–400)
RBC: 3.99 MIL/uL (ref 3.87–5.11)
RDW: 14 % (ref 11.5–15.5)
WBC: 29.6 10*3/uL — ABNORMAL HIGH (ref 4.0–10.5)

## 2016-04-12 LAB — COMPREHENSIVE METABOLIC PANEL
ALT: 36 U/L (ref 14–54)
AST: 40 U/L (ref 15–41)
Albumin: 2.8 g/dL — ABNORMAL LOW (ref 3.5–5.0)
Alkaline Phosphatase: 224 U/L — ABNORMAL HIGH (ref 38–126)
Anion gap: 19 — ABNORMAL HIGH (ref 5–15)
BUN: 36 mg/dL — ABNORMAL HIGH (ref 6–20)
CO2: 20 mmol/L — ABNORMAL LOW (ref 22–32)
Calcium: 9.3 mg/dL (ref 8.9–10.3)
Chloride: 98 mmol/L — ABNORMAL LOW (ref 101–111)
Creatinine, Ser: 1.13 mg/dL — ABNORMAL HIGH (ref 0.44–1.00)
GFR calc Af Amer: 58 mL/min — ABNORMAL LOW (ref >60)
GFR calc non Af Amer: 50 mL/min — ABNORMAL LOW (ref >60)
Glucose, Bld: 105 mg/dL — ABNORMAL HIGH (ref 65–99)
Potassium: 4.1 mmol/L (ref 3.5–5.1)
Sodium: 137 mmol/L (ref 135–145)
Total Bilirubin: 1.1 mg/dL (ref 0.3–1.2)
Total Protein: 7.3 g/dL (ref 6.5–8.1)

## 2016-04-12 LAB — I-STAT TROPONIN, ED: Troponin i, poc: 0 ng/mL (ref 0.00–0.08)

## 2016-04-12 LAB — MRSA PCR SCREENING: MRSA by PCR: NEGATIVE

## 2016-04-12 LAB — I-STAT ARTERIAL BLOOD GAS, ED
Acid-Base Excess: 4 mmol/L — ABNORMAL HIGH (ref 0.0–2.0)
Bicarbonate: 28.7 mmol/L — ABNORMAL HIGH (ref 20.0–28.0)
O2 Saturation: 100 %
Patient temperature: 38
TCO2: 30 mmol/L (ref 0–100)
pCO2 arterial: 43.2 mmHg (ref 32.0–48.0)
pH, Arterial: 7.433 (ref 7.350–7.450)
pO2, Arterial: 483 mmHg — ABNORMAL HIGH (ref 83.0–108.0)

## 2016-04-12 LAB — LACTIC ACID, PLASMA
Lactic Acid, Venous: 5 mmol/L (ref 0.5–1.9)
Lactic Acid, Venous: 4.2 mmol/L (ref 0.5–1.9)

## 2016-04-12 LAB — INFLUENZA PANEL BY PCR (TYPE A & B)
Influenza A By PCR: NEGATIVE
Influenza B By PCR: NEGATIVE

## 2016-04-12 LAB — TRIGLYCERIDES: Triglycerides: 76 mg/dL (ref <150)

## 2016-04-12 LAB — ECHOCARDIOGRAM COMPLETE
Height: 64 in
Weight: 2032 oz

## 2016-04-12 LAB — PROCALCITONIN: Procalcitonin: 27.3 ng/mL

## 2016-04-12 LAB — OSMOLALITY: Osmolality: 305 mOsm/kg — ABNORMAL HIGH (ref 275–295)

## 2016-04-12 LAB — APTT: aPTT: 25 seconds (ref 24–36)

## 2016-04-12 LAB — PROTIME-INR
INR: 1.02
Prothrombin Time: 13.4 seconds (ref 11.4–15.2)

## 2016-04-12 LAB — ETHANOL: Alcohol, Ethyl (B): <5 mg/dL (ref <5)

## 2016-04-12 MED ORDER — SENNOSIDES-DOCUSATE SODIUM 8.6-50 MG PO TABS
1.0000 | ORAL_TABLET | Freq: Every evening | ORAL | Status: DC | PRN
  Filled 2016-04-12: qty 1

## 2016-04-12 MED ORDER — ONDANSETRON HCL 4 MG/2ML IJ SOLN: 4.0000 mg | Freq: Four times a day (QID) | INTRAMUSCULAR | Status: DC | PRN

## 2016-04-12 MED ORDER — HALOPERIDOL LACTATE 5 MG/ML IJ SOLN: 5.0000 mg | Freq: Four times a day (QID) | INTRAMUSCULAR | Status: DC | PRN

## 2016-04-12 MED ORDER — VANCOMYCIN HCL IN DEXTROSE 1-5 GM/200ML-% IV SOLN
1000.0000 mg | Freq: Once | INTRAVENOUS | Status: AC
  Administered 2016-04-12: 1000 mg via INTRAVENOUS
  Filled 2016-04-12: qty 200

## 2016-04-12 MED ORDER — DEXTROSE 5 % IV SOLN
550.0000 mg | Freq: Once | INTRAVENOUS | Status: AC
  Administered 2016-04-12: 550 mg via INTRAVENOUS
  Filled 2016-04-12: qty 11

## 2016-04-12 MED ORDER — ETOMIDATE 2 MG/ML IV SOLN
INTRAVENOUS | Status: DC | PRN
  Administered 2016-04-12: 15 mg via INTRAVENOUS

## 2016-04-12 MED ORDER — IOPAMIDOL (ISOVUE-370) INJECTION 76%
INTRAVENOUS | Status: AC
  Administered 2016-04-12: 04:00:00
  Filled 2016-04-12: qty 50

## 2016-04-12 MED ORDER — DEXAMETHASONE SODIUM PHOSPHATE 10 MG/ML IJ SOLN
10.0000 mg | Freq: Once | INTRAMUSCULAR | Status: AC
  Administered 2016-04-12: 10 mg via INTRAVENOUS
  Filled 2016-04-12: qty 1

## 2016-04-12 MED ORDER — ONDANSETRON HCL 4 MG/2ML IJ SOLN
4.0000 mg | Freq: Once | INTRAMUSCULAR | Status: AC
  Administered 2016-04-12: 4 mg via INTRAVENOUS
  Filled 2016-04-12: qty 2

## 2016-04-12 MED ORDER — VANCOMYCIN HCL 500 MG IV SOLR
500.0000 mg | Freq: Two times a day (BID) | INTRAVENOUS | Status: DC
  Administered 2016-04-12 – 2016-04-14 (×4): 500 mg via INTRAVENOUS
  Filled 2016-04-12 (×5): qty 500

## 2016-04-12 MED ORDER — SUCCINYLCHOLINE CHLORIDE 20 MG/ML IJ SOLN
INTRAMUSCULAR | Status: DC | PRN
  Administered 2016-04-12: 90 mg via INTRAVENOUS

## 2016-04-12 MED ORDER — SODIUM CHLORIDE 0.9 % IV SOLN
INTRAVENOUS | Status: AC
  Administered 2016-04-12 – 2016-04-13 (×3): via INTRAVENOUS

## 2016-04-12 MED ORDER — DEXTROSE 5 % IV SOLN
2.0000 g | Freq: Two times a day (BID) | INTRAVENOUS | Status: DC
  Administered 2016-04-13 – 2016-04-23 (×21): 2 g via INTRAVENOUS
  Filled 2016-04-12 (×22): qty 2

## 2016-04-12 MED ORDER — PROPOFOL 1000 MG/100ML IV EMUL
0.0000 ug/kg/min | INTRAVENOUS | Status: DC
  Administered 2016-04-12: 15 ug/kg/min via INTRAVENOUS
  Administered 2016-04-12 – 2016-04-13 (×2): 50 ug/kg/min via INTRAVENOUS
  Administered 2016-04-13: 30 ug/kg/min via INTRAVENOUS
  Administered 2016-04-13: 50 ug/kg/min via INTRAVENOUS
  Administered 2016-04-14: 30 ug/kg/min via INTRAVENOUS
  Filled 2016-04-12 (×6): qty 100

## 2016-04-12 MED ORDER — HALOPERIDOL LACTATE 5 MG/ML IJ SOLN
5.0000 mg | Freq: Once | INTRAMUSCULAR | Status: AC
  Administered 2016-04-12: 5 mg via INTRAVENOUS
  Filled 2016-04-12: qty 1

## 2016-04-12 MED ORDER — SODIUM CHLORIDE 0.9 % IV BOLUS (SEPSIS)
500.0000 mL | Freq: Once | INTRAVENOUS | Status: AC
  Administered 2016-04-12: 500 mL via INTRAVENOUS

## 2016-04-12 MED ORDER — ACETAMINOPHEN 160 MG/5ML PO SOLN: 650.0000 mg | ORAL | Status: DC | PRN

## 2016-04-12 MED ORDER — ACETAMINOPHEN 325 MG PO TABS
650.0000 mg | ORAL_TABLET | ORAL | Status: DC | PRN
  Administered 2016-04-19: 650 mg via ORAL
  Filled 2016-04-12 (×2): qty 2

## 2016-04-12 MED ORDER — DEXTROSE 5 % IV SOLN
550.0000 mg | Freq: Two times a day (BID) | INTRAVENOUS | Status: DC
  Administered 2016-04-12 – 2016-04-13 (×2): 550 mg via INTRAVENOUS
  Filled 2016-04-12 (×3): qty 11

## 2016-04-12 MED ORDER — PANTOPRAZOLE SODIUM 40 MG IV SOLR
40.0000 mg | Freq: Every day | INTRAVENOUS | Status: DC
  Administered 2016-04-12 – 2016-04-18 (×7): 40 mg via INTRAVENOUS
  Filled 2016-04-12 (×7): qty 40

## 2016-04-12 MED ORDER — DEXTROSE 5 % IV SOLN
2.0000 g | Freq: Once | INTRAVENOUS | Status: AC
  Administered 2016-04-12: 2 g via INTRAVENOUS
  Filled 2016-04-12: qty 2

## 2016-04-12 MED ORDER — SODIUM CHLORIDE 0.9 % IV SOLN: INTRAVENOUS | Status: DC

## 2016-04-12 MED ORDER — PROPOFOL 1000 MG/100ML IV EMUL
INTRAVENOUS | Status: AC
  Filled 2016-04-12: qty 100

## 2016-04-12 MED ORDER — SODIUM CHLORIDE 0.9 % IV SOLN: 50.0000 mL | Freq: Once | INTRAVENOUS | Status: DC

## 2016-04-12 MED ORDER — SODIUM CHLORIDE 0.9 % IV BOLUS (SEPSIS)
250.0000 mL | Freq: Once | INTRAVENOUS | Status: AC
  Administered 2016-04-12: 250 mL via INTRAVENOUS

## 2016-04-12 MED ORDER — ACETAMINOPHEN 650 MG RE SUPP
650.0000 mg | RECTAL | Status: DC | PRN
  Filled 2016-04-12: qty 1

## 2016-04-12 MED ORDER — ORAL CARE MOUTH RINSE
15.0000 mL | OROMUCOSAL | Status: DC
  Administered 2016-04-12 – 2016-04-14 (×19): 15 mL via OROMUCOSAL

## 2016-04-12 MED ORDER — STROKE: EARLY STAGES OF RECOVERY BOOK
Freq: Once | Status: DC
  Filled 2016-04-12: qty 1

## 2016-04-12 MED ORDER — SODIUM CHLORIDE 0.9 % IV SOLN
2.0000 g | Freq: Once | INTRAVENOUS | Status: AC
  Administered 2016-04-12: 2 g via INTRAVENOUS
  Filled 2016-04-12: qty 2000

## 2016-04-12 MED ORDER — SODIUM CHLORIDE 0.9 % IV SOLN
2.0000 g | INTRAVENOUS | Status: DC
  Administered 2016-04-12 – 2016-04-13 (×7): 2 g via INTRAVENOUS
  Filled 2016-04-12 (×11): qty 2000

## 2016-04-12 MED ORDER — LORAZEPAM 2 MG/ML IJ SOLN
2.0000 mg | Freq: Once | INTRAMUSCULAR | Status: DC
  Filled 2016-04-12: qty 1

## 2016-04-12 MED ORDER — SODIUM CHLORIDE 0.9 % IV BOLUS (SEPSIS)
1000.0000 mL | Freq: Once | INTRAVENOUS | Status: AC
  Administered 2016-04-12: 1000 mL via INTRAVENOUS

## 2016-04-12 MED ORDER — ALTEPLASE (STROKE) FULL DOSE INFUSION
0.9000 mg/kg | Freq: Once | INTRAVENOUS | Status: AC
  Administered 2016-04-12: 52 mg via INTRAVENOUS
  Filled 2016-04-12: qty 100

## 2016-04-12 MED ORDER — CLEVIDIPINE BUTYRATE 0.5 MG/ML IV EMUL: 0.0000 mg/h | INTRAVENOUS | Status: DC

## 2016-04-12 MED ORDER — LABETALOL HCL 5 MG/ML IV SOLN
20.0000 mg | Freq: Once | INTRAVENOUS | Status: AC
  Administered 2016-04-12: 20 mg via INTRAVENOUS
  Filled 2016-04-12: qty 4

## 2016-04-12 MED ORDER — CHLORHEXIDINE GLUCONATE 0.12% ORAL RINSE (MEDLINE KIT)
15.0000 mL | Freq: Two times a day (BID) | OROMUCOSAL | Status: DC
  Administered 2016-04-12 – 2016-04-17 (×10): 15 mL via OROMUCOSAL

## 2016-04-12 MED ORDER — DEXAMETHASONE SODIUM PHOSPHATE 4 MG/ML IJ SOLN
4.0000 mg | Freq: Four times a day (QID) | INTRAMUSCULAR | Status: AC
  Administered 2016-04-12 – 2016-04-17 (×21): 4 mg via INTRAVENOUS
  Filled 2016-04-12 (×21): qty 1

## 2016-04-12 NOTE — ED Notes (Signed)
Husband tearful at this time, stating "the room is too hot and she is uncomfortable." Attempted to console husband and encouraged husband to let pt rest at this time. Husband states he "will wait in waiting room as he is too upset by how his wife is not doing well at this time." Ray, Chaplain at bedside to console husband.

## 2016-04-12 NOTE — ED Notes (Signed)
Called admitting doctor about pt per request of family. Admitting should be coming to see pt soon

## 2016-04-12 NOTE — ED Notes (Signed)
Husband upset stating, "I know my wife is in pain and you need to give her something." Paged CCM and requested no pain meds at this time due to neuro status. Explained to husband that pt cannot have meds at this time but encouraged decreased environmental stimuli to help pt rest.

## 2016-04-12 NOTE — Consult Note (Addendum)
Referring Physician: Dr. Betsey Holiday    Chief Complaint: Acute onset left hemiplegia  HPI: [name] is an 66 y.o. female who presents from home via EMS with acute onset of left hemiplegia. LKN was at 12:30 AM when she and her partner went to sleep on recliners at home. She and her husband woke up at an unknown time later and he noted her to be complaining of a headache and not behaving normally, so he called 911 at 2:30 AM. On arrival EMS noted her to be confused, her BP was 260/140. They then noted her to be weak on the left. After attempting to get her in the ambulance, she became agitated. Speech was nonsensical consisting of exclamations only. EMS noted several empty to almost empty pill bottles scattered around her place of residence, including a benzodiazepine and an opiate prescription. EMS also noted her to be febrile with tmax of 102.7. HR was 150 and CBG 85. She began projectile vomiting on arrival to the ED.   LSN: 12:30 AM tPA Given: Yes  Past Medical History:  Diagnosis Date  . Arrhythmia   . Arthritis   . Fibromyalgia   . GERD (gastroesophageal reflux disease)   . H/O: substance abuse 2005   narcotic usage due to chronic back pain  . Heart murmur   . Hypertension   . PONV (postoperative nausea and vomiting)     Past Surgical History:  Procedure Laterality Date  . ABDOMINAL HYSTERECTOMY    . ABDOMINAL SURGERY    . APPENDECTOMY    . BREAST SURGERY    . CHOLECYSTECTOMY N/A 09/30/2012   Procedure: LAPAROSCOPIC CHOLECYSTECTOMY WITH INTRAOPERATIVE CHOLANGIOGRAM;  Surgeon: Imogene Burn. Georgette Dover, MD;  Location: [facility];  Service: General;  Laterality: N/A;  . FRACTURE SURGERY     right upper arm  . OVARIAN CYST SURGERY      Family History  Problem Relation Age of Onset  . Alzheimer's disease Mother   . Cancer Father     prostate  . Heart disease Father    Social History:  reports that she has been smoking Cigarettes.  She has a 30.00 pack-year smoking history. She has never  used smokeless tobacco. She reports that she uses drugs. She reports that she does not drink alcohol.  Allergies:  Allergies  Allergen Reactions  . Codeine Nausea And Vomiting  . Nsaids Other (See Comments)    Stomach ulcers    Medications:  acetaminophen (TYLENOL) 500 MG tablet Take 1,000 mg by mouth 3 (three) times daily as needed for moderate pain.  Historical Provider, MD Needs Review  carvedilol (COREG) 12.5 MG tablet Take 12.5 mg by mouth daily.  Historical Provider, MD Needs Review  chlorhexidine (PERIDEX) 0.12 % solution 15 mL swish and spit bid Melynda Ripple, MD Needs Review   Patient not taking: Reported on 03/31/2016    cyclobenzaprine (FLEXERIL) 10 MG tablet Take 1 tablet (10 mg total) by mouth 2 (two) times daily as needed for muscle spasms. Forde Dandy, MD Needs Review  Hyprom-Naphaz-Polysorb-Zn Sulf (CLEAR EYES COMPLETE OP) Apply 2 drops to eye daily. Historical Provider, MD Needs Review  lidocaine (XYLOCAINE) 2 % solution Use as directed 10 mLs in the mouth or throat every 6 (six) hours as needed for mouth pain. Hold in mouth and spit. Do not swallow. Melynda Ripple, MD Needs Review   Patient not taking: Reported on 03/31/2016    lisinopril (PRINIVIL,ZESTRIL) 5 MG tablet Take 1 tablet (5 mg total) by mouth daily. Valetta Fuller  Jenetta Downer, MD Needs Review  methadone (DOLOPHINE) 10 MG/ML solution Take 90 mg by mouth daily.  Historical Provider, MD Needs Review  ondansetron (ZOFRAN ODT) 8 MG disintegrating tablet Take 1 tablet (8 mg total) by mouth every 8 (eight) hours as needed for nausea or vomiting. Melynda Ripple, MD Needs Review   Patient not taking: Reported on 03/31/2016    predniSONE (STERAPRED UNI-PAK 21 TAB) 10 MG (21) TBPK tablet Take 1 tablet (10 mg total) by mouth daily. Take 6 tabs by mouth daily for 2 days, then 5 tabs for 2 days, then 4 tabs for 2 days, then 3 tabs for 2 days, 2 tabs for 2 days, then 1 tab by mouth daily for 2 days Forde Dandy, MD Needs Review   traMADol (ULTRAM) 50 MG tablet Take 1 tablet (50 mg total) by mouth every 8 (eight) hours as needed. Nicolette Bang, DO Needs Review  trimethoprim-polymyxin b (POLYTRIM) ophthalmic solution Place 1 drop into both eyes every 6 (six) hours. Sela Hua, MD Needs Review   Patient not taking: Reported on 03/31/2016      ROS: Unable to obtain due to AMS.   Physical Examination: There were no vitals taken for this visit.  [place]/AT. Old scar on forehead. Left ptosis. Neck supple. Limbs warm and well perfused.   Neurologic Examination: Ment: Agitated with frequent exclamations but without coherent responses to any questions. Does not fixate on visual stimuli. Does not follow any commands. Thrashes with RUE.  CN: Closes eyes to light stimulation. Does not fixate or attend to visual stimuli. PERRL. Does not blink to threat. Eyes esotropic with left eye deviated inwards more than right. Right eye with doll's decreased movement. Left eye with doll's nearly no movement. Severe left ptosis. Subtle left facial droop. Minimal response to noxious left face, intact response on right. Unable to assess for tongue deviation.  Motor: LUE and LLE 0/5. RUE and RLE 4-5/5 movement with noxious. Sensory: With sustained noxious to LUE or LLE the patient becomes more agitated. Normal response to noxious on right.  Reflexes: Normoactive x 4. Toes equivocal.  Cerebellar/Gait: Unable to assess.   NIHSS = 25  Results for orders placed or performed during the hospital encounter of 04/12/16 (from the past 48 hour(s))  I-Stat Chem 8, ED     Status: Abnormal   Collection Time: 04/12/16  3:26 AM  Result Value Ref Range   Sodium 139 135 - 145 mmol/L   Potassium 4.1 3.5 - 5.1 mmol/L   Chloride 103 101 - 111 mmol/L   BUN 39 (H) 6 - 20 mg/dL   Creatinine, Ser 1.00 0.44 - 1.00 mg/dL   Glucose, Bld 112 (H) 65 - 99 mg/dL   Calcium, Ion 1.15 1.15 - 1.40 mmol/L   TCO2 26 0 - 100 mmol/L   Hemoglobin 13.9 12.0 - 15.0  g/dL   HCT 41.0 36.0 - 46.0 %   No results found.  Assessment: 66 y.o. female with acute onset of left hemiplegia, aphasia and confusion.  1. Exam best localizes as right hemispheric dysfunction, most likely secondary to acute ischemic stroke. No seizure at onset per husband.  2. CT without hemorrhage or acute hypodensity.  3. CTA without LVO. Findings suggestive of fibromuscular dysplasia per radiology. Small carotid terminus aneurysm also noted (see radiology report).  4. Substance abuse. Medication overdose also on DDx but would not explain left hemiplegia.  5. Lower on the DDx is meningitis/encephalitis. She has been started on empiric  antibiotics.  6. Elevated lactate. DDx includes infection and excessive movement by patient due to agitation.  7. Leukocytosis.   Plan: 1. The patient is a tPA candidate. Discussed extensively the risks/benefits of tPA treatment vs. no treatment with her family, including risks of hemorrhage and death with tPA administration versus worse overall outcomes on average in patients within tPA time window who are not administered tPA. The patient's aphasia precludes meaningful medical decision making on her part at this time. Overall benefits of tPA regarding long-term prognosis are felt to outweigh risks. No absolute contraindications to IV tPA based upon review of history in [facility] and discussion with husband. The patient's husband expressed understanding and wish to proceed with tPA.  2. Admit to ICU following tPA. Orders to include frequent neuro checks and BP management.  3. MRI brain. 4. TTE.  5. Repeat CT head 24 hours following tPA.  6. PT/OT/Speech.  7. No antiplatelet medications or anticoagulants for at least 24 hours following tPA. Consider starting an anticoagulant for her a-fib after 24 hours if her follow up CT is negative for hemorrhagic conversion.   8. DVT prophylaxis with SCDs.  9. Telemetry monitoring  10. Blood cultures x 2.  11. CXR.  12.  Meningitis dose antibiotics to be guided by pharmacy.  13. CCM consult 14. CIWA protocol.     This patient is critically ill and at significant risk of neurological worsening, death and care requires constant monitoring of vital signs, hemodynamics,respiratory and cardiac monitoring, extensive review of multiple databases, frequent neurological assessment, discussion with family, other specialists and medical decision making of high complexity. I spent 75 minutes of neurocritical care time  in the care of this patient.  @Electronically  signed: Dr. Kerney Elbe 04/12/2016, 3:33 AM

## 2016-04-12 NOTE — Consult Note (Signed)
PULMONARY / CRITICAL CARE MEDICINE   Name: [name] MRN: [id number] DOB: April 04, 1950    ADMISSION DATE:  04/12/2016 CONSULTATION DATE:  04/12/16  REFERRING MD:  Cheral Marker  CHIEF COMPLAINT:  AMS  HISTORY OF PRESENT ILLNESS:  Pt is encephelopathic; therefore, this HPI is obtained from chart review. [name] is a 66 y.o. female with PMH as outlined below. She presented to [facility] ED 02/02 with acute onset left sided weakness and dysphasia that started between midnight and 1am.  Per her husband, pt has chronic neck pain but this had gotten worse over the past few days.  They saw PCP who prescribed her a prednisone pack and flexeril (she is chronically on methadone and valium; though valium is not listed on outpatient med list).  Pt and husband then went to sleep just before midnight and husband then woke up around 1am and found her to have above symptoms.  En route to ED, she had projectile vomiting.  In ED, she had fever (Tmax 102.7), tachycardia with rates in 150's, altered mental status, dysphasia, left sided weakness.  EMS informed EDP that there was concern for prescription drug abuse since pt had bottles of hydrocodone recently filled 4 days ago but only had 2 pills left, as well as diazepam bottle that was empty.  CT head was obtained and was normal.  She was evaluated by neurology who felt that she was a tPA candidate, she received this at 0435.  She was also started on empiric coverage for meningitis.  She had no improvement after tPA; therefore, PCCM was consulted for assistance.   PAST MEDICAL HISTORY :  She  has a past medical history of Arrhythmia; Arthritis; Fibromyalgia; GERD (gastroesophageal reflux disease); H/O: substance abuse (2005); Heart murmur; Hypertension; and PONV (postoperative nausea and vomiting).  PAST SURGICAL HISTORY: She  has a past surgical history that includes Abdominal hysterectomy; Abdominal surgery; Breast surgery; Appendectomy; Ovarian cyst surgery; Fracture  surgery; and Cholecystectomy (N/A, 09/30/2012).  Allergies  Allergen Reactions  . Codeine Nausea And Vomiting  . Nsaids Other (See Comments)    Stomach ulcers    No current facility-administered medications on file prior to encounter.    Current Outpatient Prescriptions on File Prior to Encounter  Medication Sig  . acetaminophen (TYLENOL) 500 MG tablet Take 1,000 mg by mouth 3 (three) times daily as needed for moderate pain.   . carvedilol (COREG) 12.5 MG tablet Take 12.5 mg by mouth daily.   . chlorhexidine (PERIDEX) 0.12 % solution 15 mL swish and spit bid (Patient not taking: Reported on 03/31/2016)  . cyclobenzaprine (FLEXERIL) 10 MG tablet Take 1 tablet (10 mg total) by mouth 2 (two) times daily as needed for muscle spasms.  . Hyprom-Naphaz-Polysorb-Zn Sulf (CLEAR EYES COMPLETE OP) Apply 2 drops to eye daily.  Marland Kitchen lidocaine (XYLOCAINE) 2 % solution Use as directed 10 mLs in the mouth or throat every 6 (six) hours as needed for mouth pain. Hold in mouth and spit. Do not swallow. (Patient not taking: Reported on 03/31/2016)  . lisinopril (PRINIVIL,ZESTRIL) 5 MG tablet Take 1 tablet (5 mg total) by mouth daily.  . methadone (DOLOPHINE) 10 MG/ML solution Take 90 mg by mouth daily.   . ondansetron (ZOFRAN ODT) 8 MG disintegrating tablet Take 1 tablet (8 mg total) by mouth every 8 (eight) hours as needed for nausea or vomiting. (Patient not taking: Reported on 03/31/2016)  . predniSONE (STERAPRED UNI-PAK 21 TAB) 10 MG (21) TBPK tablet Take 1 tablet (10 mg total) by  mouth daily. Take 6 tabs by mouth daily  for 2 days, then 5 tabs for 2 days, then 4 tabs for 2 days, then 3 tabs for 2 days, 2 tabs for 2 days, then 1 tab by mouth daily for 2 days  . traMADol (ULTRAM) 50 MG tablet Take 1 tablet (50 mg total) by mouth every 8 (eight) hours as needed.  . trimethoprim-polymyxin b (POLYTRIM) ophthalmic solution Place 1 drop into both eyes every 6 (six) hours. (Patient not taking: Reported on 03/31/2016)     FAMILY HISTORY:  Her indicated that her mother is deceased. She indicated that her father is deceased.    SOCIAL HISTORY: She  reports that she has been smoking Cigarettes.  She has a 30.00 pack-year smoking history. She has never used smokeless tobacco. She reports that she does not drink alcohol or use drugs.  REVIEW OF SYSTEMS:   Unable to obtain as pt is encephalopathic.  SUBJECTIVE:  Awake but clearly confused, repeats "oh god" several times, but unable to participate in interview.  Husband at bedside and is very antsy.  VITAL SIGNS: BP 180/92   Pulse (!) 148   Temp 101.3 F (38.5 C) (Core (Comment))   Resp 20   Ht 5\' 4"  (1.626 m)   Wt 57.6 kg (127 lb)   SpO2 100%   BMI 21.80 kg/m   HEMODYNAMICS:    VENTILATOR SETTINGS:    INTAKE / OUTPUT: No intake/output data recorded.   PHYSICAL EXAMINATION: General: Middle aged female, confused. Neuro: Awake but confused.  Unable to answer questions.  Flaccid LUE and LLE. Strong purposeful movements of RUE and RLE. HEENT: Rogers/AT. PERRL, sclerae anicteric, left eye ptosis.  Slight neck stiffness but do not feel that this is true meningismus. Cardiovascular: RRR, no M/R/G.  Lungs: Respirations even and unlabored.  CTA bilaterally, No W/R/R.  Abdomen: BS x 4, soft, NT/ND.  Musculoskeletal: No gross deformities, no edema.  Skin: Intact, warm, no rashes.  LABS:  BMET  Recent Labs Lab 04/12/16 0313 04/12/16 0326  NA 137 139  K 4.1 4.1  CL 98* 103  CO2 20*  --   BUN 36* 39*  CREATININE 1.13* 1.00  GLUCOSE 105* 112*    Electrolytes  Recent Labs Lab 04/12/16 0313  CALCIUM 9.3    CBC  Recent Labs Lab 04/12/16 0313 04/12/16 0326  WBC 29.6*  --   HGB 13.1 13.9  HCT 37.9 41.0  PLT 505*  --     Coag's  Recent Labs Lab 04/12/16 0406  APTT 25  INR 1.02    Sepsis Markers  Recent Labs Lab 04/12/16 0425  LATICACIDVEN 5.58*    ABG No results for input(s): PHART, PCO2ART, PO2ART in the last  168 hours.  Liver Enzymes  Recent Labs Lab 04/12/16 0313  AST 40  ALT 36  ALKPHOS 224*  BILITOT 1.1  ALBUMIN 2.8*    Cardiac Enzymes No results for input(s): TROPONINI, PROBNP in the last 168 hours.  Glucose No results for input(s): GLUCAP in the last 168 hours.  Imaging Ct Angio Head W Or Wo Contrast  Result Date: 04/12/2016 CLINICAL DATA:  Last seen normal at midnight. Dense LEFT hemi paresis. Possible drug overdose. History of hypertension. EXAM: CT ANGIOGRAPHY HEAD AND NECK TECHNIQUE: Multidetector CT imaging of the head and neck was performed using the standard protocol during bolus administration of intravenous contrast. Multiplanar CT image reconstructions and MIPs were obtained to evaluate the vascular anatomy. Carotid stenosis measurements (when applicable) are  obtained utilizing NASCET criteria, using the distal internal carotid diameter as the denominator. CONTRAST:  50 cc Isovue 370 COMPARISON:  CT HEAD February 09, 2017 at 0323 hours FINDINGS: CTA NECK AORTIC ARCH: Normal appearance of the thoracic arch, normal branch pattern. Mild calcific atherosclerosis of the aortic arch. The origins of the innominate, left Common carotid artery and subclavian artery are widely patent. RIGHT CAROTID SYSTEM: Common carotid artery is widely patent, coursing in a straight line fashion. Mild beaded appearance/luminal irregularity RIGHT carotid artery with moderate mid cervical internal carotid artery stenosis associated with 1-2 mm outpouching associated with FMD. No intimal hematoma or flow limiting stenosis. LEFT CAROTID SYSTEM: Common carotid artery is widely patent, coursing in a straight line fashion. Normal appearance of the carotid bifurcation without hemodynamically significant stenosis by NASCET criteria. Mild beaded appearance/ luminal irregularity internal carotid artery. VERTEBRAL ARTERIES:RIGHT vertebral artery is dominant. Normal appearance of the vertebral arteries, which appear  widely patent. SKELETON: No acute osseous process though bone windows have not been submitted. Degenerative cervical spine resulting an moderate to severe RIGHT C3-4, RIGHT C4-5, bilateral severe C5-6 neural foraminal narrowing. Patient is edentulous. OTHER NECK: Soft tissues of the neck are non-acute though, not tailored for evaluation. 5 mm superficial LEFT lower facial nodule, recommend direct inspection. CTA HEAD ANTERIOR CIRCULATION: Patent cervical internal carotid arteries, petrous, cavernous and supra clinoid internal carotid arteries. Mild luminal irregularity proximal supraclinoid internal carotid artery compatible with atherosclerosis. 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 Widely patent anterior communicating artery. Moderate luminal irregularity of the anterior middle cerebral arteries compatible with atherosclerosis. No large vessel occlusion, hemodynamically significant stenosis, dissection, contrast extravasation. POSTERIOR CIRCULATION: Patent vertebral arteries, vertebrobasilar junction and basilar artery, as well as main branch vessels. Mild stenosis distal RIGHT V4 segment. Mild luminal irregularity of the basilar artery. Moderate tandem stenoses bilateral posterior cerebral arteries. Patent posterior cerebral arteries. No large vessel occlusion, hemodynamically significant stenosis, dissection, contrast extravasation or aneurysm. VENOUS SINUSES: Major dural venous sinuses are patent though not tailored for evaluation on this angiographic examination. ANATOMIC VARIANTS: None. DELAYED PHASE: Not performed. IMPRESSION: CTA NECK: Bilateral cervical internal carotid artery luminal irregularity most characteristic of fibromuscular dysplasia with moderate stenosis RIGHT mid RIGHT ICA, suggesting old dissection without flow limiting stenosis. No hemodynamically significant stenosis or acute vascular process in the neck. CTA HEAD:  No emergent large vessel occlusion or severe stenosis.  Moderate intracranial atherosclerosis. 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 Acute findings discussed with and reconfirmed by Dr.ERIC Cheral Marker on 04/12/2016 at 4:10 am. Electronically Signed   By: Elon Alas M.D.   On: 04/12/2016 04:18   Ct Angio Neck W Or Wo Contrast  Result Date: 04/12/2016 CLINICAL DATA:  Last seen normal at midnight. Dense LEFT hemi paresis. Possible drug overdose. History of hypertension. EXAM: CT ANGIOGRAPHY HEAD AND NECK TECHNIQUE: Multidetector CT imaging of the head and neck was performed using the standard protocol during bolus administration of intravenous contrast. Multiplanar CT image reconstructions and MIPs were obtained to evaluate the vascular anatomy. Carotid stenosis measurements (when applicable) are obtained utilizing NASCET criteria, using the distal internal carotid diameter as the denominator. CONTRAST:  50 cc Isovue 370 COMPARISON:  CT HEAD February 09, 2017 at 0323 hours FINDINGS: CTA NECK AORTIC ARCH: Normal appearance of the thoracic arch, normal branch pattern. Mild calcific atherosclerosis of the aortic arch. The origins of the innominate, left Common carotid artery and subclavian artery are widely patent. RIGHT CAROTID SYSTEM: Common carotid artery is widely patent,  coursing in a straight line fashion. Mild beaded appearance/luminal irregularity RIGHT carotid artery with moderate mid cervical internal carotid artery stenosis associated with 1-2 mm outpouching associated with FMD. No intimal hematoma or flow limiting stenosis. LEFT CAROTID SYSTEM: Common carotid artery is widely patent, coursing in a straight line fashion. Normal appearance of the carotid bifurcation without hemodynamically significant stenosis by NASCET criteria. Mild beaded appearance/ luminal irregularity internal carotid artery. VERTEBRAL ARTERIES:RIGHT vertebral artery is dominant. Normal appearance of the vertebral arteries, which appear widely patent. SKELETON: No acute osseous  process though bone windows have not been submitted. Degenerative cervical spine resulting an moderate to severe RIGHT C3-4, RIGHT C4-5, bilateral severe C5-6 neural foraminal narrowing. Patient is edentulous. OTHER NECK: Soft tissues of the neck are non-acute though, not tailored for evaluation. 5 mm superficial LEFT lower facial nodule, recommend direct inspection. CTA HEAD ANTERIOR CIRCULATION: Patent cervical internal carotid arteries, petrous, cavernous and supra clinoid internal carotid arteries. Mild luminal irregularity proximal supraclinoid internal carotid artery compatible with atherosclerosis. 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 Widely patent anterior communicating artery. Moderate luminal irregularity of the anterior middle cerebral arteries compatible with atherosclerosis. No large vessel occlusion, hemodynamically significant stenosis, dissection, contrast extravasation. POSTERIOR CIRCULATION: Patent vertebral arteries, vertebrobasilar junction and basilar artery, as well as main branch vessels. Mild stenosis distal RIGHT V4 segment. Mild luminal irregularity of the basilar artery. Moderate tandem stenoses bilateral posterior cerebral arteries. Patent posterior cerebral arteries. No large vessel occlusion, hemodynamically significant stenosis, dissection, contrast extravasation or aneurysm. VENOUS SINUSES: Major dural venous sinuses are patent though not tailored for evaluation on this angiographic examination. ANATOMIC VARIANTS: None. DELAYED PHASE: Not performed. IMPRESSION: CTA NECK: Bilateral cervical internal carotid artery luminal irregularity most characteristic of fibromuscular dysplasia with moderate stenosis RIGHT mid RIGHT ICA, suggesting old dissection without flow limiting stenosis. No hemodynamically significant stenosis or acute vascular process in the neck. CTA HEAD:  No emergent large vessel occlusion or severe stenosis. Moderate intracranial atherosclerosis. 2 mm  intact LEFT carotid terminus aneurysm. Acute findings discussed with and reconfirmed by Dr.ERIC Cheral Marker on 04/12/2016 at 4:10 am. Electronically Signed   By: Elon Alas M.D.   On: 04/12/2016 04:18   Dg Chest Portable 1 View  Result Date: 04/12/2016 CLINICAL DATA:  Altered mental status, fever, tachycardia. EXAM: PORTABLE CHEST 1 VIEW COMPARISON:  Chest radiograph August 16, 2015 FINDINGS: Cardiomediastinal silhouette is normal. No pleural effusions. Patchy bibasilar airspace opacities with nodular densities RIGHT lung base. Biapical pleural thickening. Trachea projects midline and there is no pneumothorax. Soft tissue planes and included osseous structures are non-suspicious. IMPRESSION: Bibasilar atelectasis, less likely pneumonia. Nodular densities RIGHT lung base. Recommend follow-up PA and lateral views the chest when clinically able. Electronically Signed   By: Elon Alas M.D.   On: 04/12/2016 05:39   Ct Head Code Stroke W/o Cm  Result Date: 04/12/2016 CLINICAL DATA:  Code stroke. Altered mental status, LEFT-sided weakness last seen normal at 0030 hours. Possible drug overdose. History of hypertension. EXAM: CT HEAD WITHOUT CONTRAST TECHNIQUE: Contiguous axial images were obtained from the base of the skull through the vertex without intravenous contrast. COMPARISON:  CT HEAD March 31, 2016 FINDINGS: Mild motion degraded examination. BRAIN: The ventricles and sulci are normal. No intraparenchymal hemorrhage, mass effect nor midline shift. No acute large vascular territory infarcts. No abnormal extra-axial fluid collections. Basal cisterns are patent. VASCULAR: Unremarkable. SKULL/SOFT TISSUES: No skull fracture. No significant soft tissue swelling. ORBITS/SINUSES: The included ocular globes and orbital contents are  normal.The mastoid aircells and included paranasal sinuses are well-aerated. OTHER: None. ASPECTS [facility] Stroke Program Early CT Score) - Ganglionic level infarction (caudate,  lentiform nuclei, internal capsule, insula, M1-M3 cortex): 7 - Supraganglionic infarction (M4-M6 cortex): 3 Total score (0-10 with 10 being normal): 10 IMPRESSION: 1. No acute intracranial process; negative CT HEAD for age. 2. ASPECTS is 10. Critical Value/emergent results were called by telephone at the time of interpretation on 04/12/2016 at 3:53 am to Dr. Cheral Marker, who verbally acknowledged these results. Electronically Signed   By: Elon Alas M.D.   On: 04/12/2016 03:56     STUDIES:  CT head 02/02 > negative. MRI brain 02/02 > CT head 02/03 >  CULTURES: Blood 02/02 > Urine 02/02 >  ANTIBIOTICS: Vanc 02/02 > Ceftriaxone 02/02 > Ampicillin 02/02 > Acyclovir 02/02 >  SIGNIFICANT EVENTS: 02/02 > admit.  LINES/TUBES: None.  DISCUSSION: 66 y.o. female admitted 02/02 with acute encephalopathy and concern for acute CVA.  She received tPA at 0435 but had no improvement afterwards.  PCCM asked to assist.  ASSESSMENT / PLAN:  NEUROLOGIC A:   Acute encephalopathy - unclear etiology at this point.  Concern for acute CVA given acute left hemiplegia, dysphasia, confusion.  Also some concern for meningitis given  fever + AMS.  Has had neck pain but per husband, she has chronic neck pain so hard to put this into context.  LP not performed prior to tPA.  Also concern for prescription drug abuse (2 pills left on hydrocodone bottle and empty diazepam bottle both filled recently).   Hx chronic back pain (with subsequent narcotic abuse), fibromyalgia, arthritis. P:   Neurology following / managing. Frequent neuro checks. F/u MRI brain, CT head. Continue empiric abx and decadron. Assess UDS, ethanol. Avoid sedating meds. Hold preadmission cyclobenzaprine, methadone. Substance abuse counseling once mental status improves.  INFECTIOUS A:   Sepsis - unclear etiology at this point.  Some concern for meningitis given fever + AMS.  Has had neck pain but per husband, she has chronic neck pain  so hard to put this into context.  LP not performed prior to tPA. A repeat sepsis assessment has been completed. P:   Abx as above (vanc / ceftriaxone / ampicillin / acyclovir).  Follow cultures as above. PCT algorithm to limit abx exposure. May need LP if no improvement (though note, received tPA at 0435 on 04/12/16).  CARDIOVASCULAR A:  Sepsis - unclear etiology at this point.  Some concern for meningitis given fever + AMS.  Has had neck pain but per husband, she has chronic neck pain so hard to put this into context.  A repeat sepsis assessment has been completed. Hypertensive urgency. Hx HTN, murmur. P:  Continue supportive care. Continue cleviprex gtt. Abx / cultures per ID section. Hold preadmission carvedilol, lisinopril.  PULMONARY A: At risk intubation if mental status declines. Tobacco dependence. P:   Monitor closely. Pulmonary hygiene. Aspiration precautions. Tobacco cessation counseling once mental status improves.  RENAL A:   AGMA - lactate. P:   NS @ 75. Trend lactate. BMP in AM.  GASTROINTESTINAL A:   Nausea with vomiting. Nutrition. Hx GERD. P:   Zofran PRN. NPO.  HEMATOLOGIC A:   S/p tPA at [id number] on 04/12/16. VTE Prophylaxis. P:  No IV sticks, no antiplatelets / anticoagulants for 24 hrs. Monitor for signs of bleeding. SCD's only. CBC in AM.  ENDOCRINE A:   No acute issues. P:   No interventions required.  Family updated: Husband updated at  bedside.  Interdisciplinary Family Meeting v Palliative Care Meeting:  Due by: 04/19/16.  CC time: 35 minutes.   Montey Hora, [facility] Pager: [phone]  or [phone] 04/12/2016, 6:05 AM

## 2016-04-12 NOTE — ED Notes (Signed)
Pt son and sister now at bedside requesting to speak with doctors who are admitting pt.

## 2016-04-12 NOTE — ED Provider Notes (Signed)
I was notified by nursing of patient's declining mental status. Patient is currently admitted to ICU for suspected stroke, severe sepsis with history of IV drug use. On my assessment, patient is altered, moaning, incoherent. She is scheduled to undergo an MRI and ICU is requesting haldol and ativan. Pt given haldol prior to my assessment.  At this time, I am concerned for pt's ability to protect her airway. She is persistently altered and we are unable to obtain MRI at this time due to AMS, movement, and combativeness I discussed this with the admitting team as well as pt's son and family. Decision made to intubate pt given worsening mental status, likely 2/2 sepsis, CVA, as well as medications, as well as persistent tachypnea (RR 40s) with concern for eventual resp failure 2/2 muscular fatigue.   Pt taken to Trauma bay, pre-oxygenated and intubated as above. Tolerated well. Post-intubation CXR confirms placement. Admit to ICU.  CRITICAL CARE Performed by: Evonnie Pat   Total critical care time: 35 minutes  Critical care time was exclusive of separately billable procedures and treating other patients.  Critical care was necessary to treat or prevent imminent or life-threatening deterioration.  Critical care was time spent personally by me on the following activities: development of treatment plan with patient and/or surrogate as well as nursing, discussions with consultants, evaluation of patient's response to treatment, examination of patient, obtaining history from patient or surrogate, ordering and performing treatments and interventions, ordering and review of laboratory studies, ordering and review of radiographic studies, pulse oximetry and re-evaluation of patient's condition.   Procedure Name: Intubation Date/Time: 04/13/2016 12:21 AM Performed by: Duffy Bruce Pre-anesthesia Checklist: Patient identified, Emergency Drugs available, Suction available, Patient being monitored and  Timeout performed Oxygen Delivery Method: Non-rebreather mask Preoxygenation: Pre-oxygenation with 100% oxygen Intubation Type: Rapid sequence Ventilation: Mask ventilation without difficulty Laryngoscope Size: Mac and 3 Grade View: Grade I Tube size: 7.5 mm Number of attempts: 1 Airway Equipment and Method: Rigid stylet Placement Confirmation: ETT inserted through vocal cords under direct vision,  Positive ETCO2,  CO2 detector and Breath sounds checked- equal and bilateral Secured at: 23 cm Dental Injury: Teeth and Oropharynx as per pre-operative assessment  Future Recommendations: Recommend- induction with short-acting agent, and alternative techniques readily available         Duffy Bruce, MD 04/13/16 [phone]

## 2016-04-12 NOTE — ED Notes (Signed)
Spoke with admitting MD Nu about pt and MD states he will come as soon as he can to see pt.

## 2016-04-12 NOTE — Plan of Care (Signed)
Discussed with ED physician, agree with intubation for tachypnea and agitation after haldol and ativan as well as airway protection. Once intubated, will do MRI and MRV for further evaluation.   Rosalin Hawking, MD PhD Stroke Neurology 04/12/2016 5:01 PM

## 2016-04-12 NOTE — ED Provider Notes (Signed)
[facility] DEPT Provider Note   CSN: [id number] Arrival date & time: 04/12/16  [id number]   By signing my name below, I, Delton Prairie, attest that this documentation has been prepared under the direction and in the presence of Orpah Greek, MD  Electronically Signed: Delton Prairie, ED Scribe. 04/12/16. 3:27 AM.   History   Chief Complaint Chief Complaint  Patient presents with  . Altered Mental Status    The history is provided by the EMS personnel. No language interpreter was used.   HPI Comments: LEVEL 5 CAVEAT DUE TO ACUITY OF CONDITION.    [name] is a 66 y.o. female who presents to the Emergency Department, via EMS, with acute onset left sided weakness onset between 74 AM-1 AM. Per EMS, pt's husband said when they fell asleep at 12 AM the pt was completely normal. He awoke at 1 AM and found pt in her current condition. Per EMS pt has a fever (tmax 102.7), heart rate is 150, CBG was 85 and she began vomiting upon arrival to the ED. EMS personnel also reports the pt might be abusing her medications as her hydrocodone bottle has two pills left and her diazepam bottle was empty. These medications were filled about 4 days ago. No other symptoms noted.   Past Medical History:  Diagnosis Date  . Arrhythmia   . Arthritis   . Fibromyalgia   . GERD (gastroesophageal reflux disease)   . H/O: substance abuse 2005   narcotic usage due to chronic back pain  . Heart murmur   . Hypertension   . PONV (postoperative nausea and vomiting)     Patient Active Problem List   Diagnosis Date Noted  . Cerebral embolism with cerebral infarction 04/12/2016  . Stroke (cerebrum) ([place]) 04/12/2016  . Neck pain 04/04/2016  . Chronic abdominal pain 02/19/2016  . Essential hypertension 02/19/2016  . Bacterial conjunctivitis 02/19/2016  . Health care maintenance 02/19/2016  . ANXIETY 05/08/2006  . TOBACCO DEPENDENCE 05/08/2006  . ASTHMA, UNSPECIFIED 05/08/2006  . GASTROESOPHAGEAL REFLUX,  NO ESOPHAGITIS 05/08/2006  . CONSTIPATION 05/08/2006  . CONVULSIONS, SEIZURES, NOS 05/08/2006    Past Surgical History:  Procedure Laterality Date  . ABDOMINAL HYSTERECTOMY    . ABDOMINAL SURGERY    . APPENDECTOMY    . BREAST SURGERY    . CHOLECYSTECTOMY N/A 09/30/2012   Procedure: LAPAROSCOPIC CHOLECYSTECTOMY WITH INTRAOPERATIVE CHOLANGIOGRAM;  Surgeon: Imogene Burn. Georgette Dover, MD;  Location: [facility];  Service: General;  Laterality: N/A;  . FRACTURE SURGERY     right upper arm  . OVARIAN CYST SURGERY      OB History    No data available       Home Medications    Prior to Admission medications   Medication Sig Start Date End Date Taking? Authorizing Provider  acetaminophen (TYLENOL) 500 MG tablet Take 1,000 mg by mouth 3 (three) times daily as needed for moderate pain.     Historical Provider, MD  carvedilol (COREG) 12.5 MG tablet Take 12.5 mg by mouth daily.     Historical Provider, MD  chlorhexidine (PERIDEX) 0.12 % solution 15 mL swish and spit bid Patient not taking: Reported on 03/31/2016 09/01/15   Melynda Ripple, MD  cyclobenzaprine (FLEXERIL) 10 MG tablet Take 1 tablet (10 mg total) by mouth 2 (two) times daily as needed for muscle spasms. 04/01/16   Forde Dandy, MD  Hyprom-Naphaz-Polysorb-Zn Sulf (CLEAR EYES COMPLETE OP) Apply 2 drops to eye daily.    Historical Provider, MD  lidocaine (XYLOCAINE) 2 % solution Use as directed 10 mLs in the mouth or throat every 6 (six) hours as needed for mouth pain. Hold in mouth and spit. Do not swallow. Patient not taking: Reported on 03/31/2016 09/01/15   Melynda Ripple, MD  lisinopril (PRINIVIL,ZESTRIL) 5 MG tablet Take 1 tablet (5 mg total) by mouth daily. 02/19/16   Sela Hua, MD  methadone (DOLOPHINE) 10 MG/ML solution Take 90 mg by mouth daily.     Historical Provider, MD  ondansetron (ZOFRAN ODT) 8 MG disintegrating tablet Take 1 tablet (8 mg total) by mouth every 8 (eight) hours as needed for nausea or vomiting. Patient not  taking: Reported on 03/31/2016 09/01/15   Melynda Ripple, MD  predniSONE (STERAPRED UNI-PAK 21 TAB) 10 MG (21) TBPK tablet Take 1 tablet (10 mg total) by mouth daily. Take 6 tabs by mouth daily  for 2 days, then 5 tabs for 2 days, then 4 tabs for 2 days, then 3 tabs for 2 days, 2 tabs for 2 days, then 1 tab by mouth daily for 2 days 04/01/16   Forde Dandy, MD  traMADol (ULTRAM) 50 MG tablet Take 1 tablet (50 mg total) by mouth every 8 (eight) hours as needed. 04/04/16   Nicolette Bang, DO  trimethoprim-polymyxin b (POLYTRIM) ophthalmic solution Place 1 drop into both eyes every 6 (six) hours. Patient not taking: Reported on 03/31/2016 02/19/16   Sela Hua, MD    Family History Family History  Problem Relation Age of Onset  . Alzheimer's disease Mother   . Cancer Father     prostate  . Heart disease Father     Social History Social History  Substance Use Topics  . Smoking status: Current Every Day Smoker    Packs/day: 1.00    Years: 30.00    Types: Cigarettes  . Smokeless tobacco: Never Used  . Alcohol use No     Allergies   Codeine and Nsaids   Review of Systems Review of Systems  Unable to perform ROS: Acuity of condition   Physical Exam Updated Vital Signs BP 138/87 (BP Location: Left Arm)   Pulse (!) 121   Temp 101.3 F (38.5 C) (Core (Comment))   Resp 22   Ht 5\' 4"  (1.626 m)   Wt 127 lb (57.6 kg)   SpO2 96%   BMI 21.80 kg/m   Physical Exam  Constitutional: She appears well-developed and well-nourished. She appears distressed.  HENT:  Head: Normocephalic and atraumatic.  Eyes: Pupils are equal, round, and reactive to light.  Neck: Neck supple.  Cardiovascular: Normal heart sounds.  Tachycardia present.   Pulmonary/Chest: Effort normal and breath sounds normal.  Abdominal: Soft.  Musculoskeletal: She exhibits no deformity.  Neurological: She is disoriented. GCS eye subscore is 4. GCS verbal subscore is 4. GCS motor subscore is 5.  Left  hemiparesis     ED Treatments / Results  Labs (all labs ordered are listed, but only abnormal results are displayed) Labs Reviewed  CBC - Abnormal; Notable for the following:       Result Value   WBC 29.6 (*)    Platelets 505 (*)    All other components within normal limits  DIFFERENTIAL - Abnormal; Notable for the following:    Neutro Abs 27.8 (*)    All other components within normal limits  COMPREHENSIVE METABOLIC PANEL - Abnormal; Notable for the following:    Chloride 98 (*)    CO2 20 (*)  Glucose, Bld 105 (*)    BUN 36 (*)    Creatinine, Ser 1.13 (*)    Albumin 2.8 (*)    Alkaline Phosphatase 224 (*)    GFR calc non Af Amer 50 (*)    GFR calc Af Amer 58 (*)    Anion gap 19 (*)    All other components within normal limits  URINALYSIS, ROUTINE W REFLEX MICROSCOPIC - Abnormal; Notable for the following:    Hgb urine dipstick SMALL (*)    Bacteria, UA RARE (*)    All other components within normal limits  I-STAT CHEM 8, ED - Abnormal; Notable for the following:    BUN 39 (*)    Glucose, Bld 112 (*)    All other components within normal limits  I-STAT CG4 LACTIC ACID, ED - Abnormal; Notable for the following:    Lactic Acid, Venous 5.58 (*)    All other components within normal limits  CULTURE, BLOOD (ROUTINE X 2)  CULTURE, BLOOD (ROUTINE X 2)  URINE CULTURE  PROTIME-INR  APTT  RAPID URINE DRUG SCREEN, HOSP PERFORMED  OSMOLALITY  LACTIC ACID, PLASMA  LACTIC ACID, PLASMA  ETHANOL  PROCALCITONIN  I-STAT TROPOININ, ED  CBG MONITORING, ED  I-STAT CG4 LACTIC ACID, ED    EKG  EKG Interpretation None       Radiology Ct Angio Head W Or Wo Contrast  Result Date: 04/12/2016 CLINICAL DATA:  Last seen normal at midnight. Dense LEFT hemi paresis. Possible drug overdose. History of hypertension. EXAM: CT ANGIOGRAPHY HEAD AND NECK TECHNIQUE: Multidetector CT imaging of the head and neck was performed using the standard protocol during bolus administration of  intravenous contrast. Multiplanar CT image reconstructions and MIPs were obtained to evaluate the vascular anatomy. Carotid stenosis measurements (when applicable) are obtained utilizing NASCET criteria, using the distal internal carotid diameter as the denominator. CONTRAST:  50 cc Isovue 370 COMPARISON:  CT HEAD February 09, 2017 at 0323 hours FINDINGS: CTA NECK AORTIC ARCH: Normal appearance of the thoracic arch, normal branch pattern. Mild calcific atherosclerosis of the aortic arch. The origins of the innominate, left Common carotid artery and subclavian artery are widely patent. RIGHT CAROTID SYSTEM: Common carotid artery is widely patent, coursing in a straight line fashion. Mild beaded appearance/luminal irregularity RIGHT carotid artery with moderate mid cervical internal carotid artery stenosis associated with 1-2 mm outpouching associated with FMD. No intimal hematoma or flow limiting stenosis. LEFT CAROTID SYSTEM: Common carotid artery is widely patent, coursing in a straight line fashion. Normal appearance of the carotid bifurcation without hemodynamically significant stenosis by NASCET criteria. Mild beaded appearance/ luminal irregularity internal carotid artery. VERTEBRAL ARTERIES:RIGHT vertebral artery is dominant. Normal appearance of the vertebral arteries, which appear widely patent. SKELETON: No acute osseous process though bone windows have not been submitted. Degenerative cervical spine resulting an moderate to severe RIGHT C3-4, RIGHT C4-5, bilateral severe C5-6 neural foraminal narrowing. Patient is edentulous. OTHER NECK: Soft tissues of the neck are non-acute though, not tailored for evaluation. 5 mm superficial LEFT lower facial nodule, recommend direct inspection. CTA HEAD ANTERIOR CIRCULATION: Patent cervical internal carotid arteries, petrous, cavernous and supra clinoid internal carotid arteries. Mild luminal irregularity proximal supraclinoid internal carotid artery compatible with  atherosclerosis. 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 Widely patent anterior communicating artery. Moderate luminal irregularity of the anterior middle cerebral arteries compatible with atherosclerosis. No large vessel occlusion, hemodynamically significant stenosis, dissection, contrast extravasation. POSTERIOR CIRCULATION: Patent vertebral arteries, vertebrobasilar junction and basilar  artery, as well as main branch vessels. Mild stenosis distal RIGHT V4 segment. Mild luminal irregularity of the basilar artery. Moderate tandem stenoses bilateral posterior cerebral arteries. Patent posterior cerebral arteries. No large vessel occlusion, hemodynamically significant stenosis, dissection, contrast extravasation or aneurysm. VENOUS SINUSES: Major dural venous sinuses are patent though not tailored for evaluation on this angiographic examination. ANATOMIC VARIANTS: None. DELAYED PHASE: Not performed. IMPRESSION: CTA NECK: Bilateral cervical internal carotid artery luminal irregularity most characteristic of fibromuscular dysplasia with moderate stenosis RIGHT mid RIGHT ICA, suggesting old dissection without flow limiting stenosis. No hemodynamically significant stenosis or acute vascular process in the neck. CTA HEAD:  No emergent large vessel occlusion or severe stenosis. Moderate intracranial atherosclerosis. 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 Acute findings discussed with and reconfirmed by Dr.ERIC Cheral Marker on 04/12/2016 at 4:10 am. Electronically Signed   By: Elon Alas M.D.   On: 04/12/2016 04:18   Ct Angio Neck W Or Wo Contrast  Result Date: 04/12/2016 CLINICAL DATA:  Last seen normal at midnight. Dense LEFT hemi paresis. Possible drug overdose. History of hypertension. EXAM: CT ANGIOGRAPHY HEAD AND NECK TECHNIQUE: Multidetector CT imaging of the head and neck was performed using the standard protocol during bolus administration of intravenous contrast. Multiplanar CT image  reconstructions and MIPs were obtained to evaluate the vascular anatomy. Carotid stenosis measurements (when applicable) are obtained utilizing NASCET criteria, using the distal internal carotid diameter as the denominator. CONTRAST:  50 cc Isovue 370 COMPARISON:  CT HEAD February 09, 2017 at 0323 hours FINDINGS: CTA NECK AORTIC ARCH: Normal appearance of the thoracic arch, normal branch pattern. Mild calcific atherosclerosis of the aortic arch. The origins of the innominate, left Common carotid artery and subclavian artery are widely patent. RIGHT CAROTID SYSTEM: Common carotid artery is widely patent, coursing in a straight line fashion. Mild beaded appearance/luminal irregularity RIGHT carotid artery with moderate mid cervical internal carotid artery stenosis associated with 1-2 mm outpouching associated with FMD. No intimal hematoma or flow limiting stenosis. LEFT CAROTID SYSTEM: Common carotid artery is widely patent, coursing in a straight line fashion. Normal appearance of the carotid bifurcation without hemodynamically significant stenosis by NASCET criteria. Mild beaded appearance/ luminal irregularity internal carotid artery. VERTEBRAL ARTERIES:RIGHT vertebral artery is dominant. Normal appearance of the vertebral arteries, which appear widely patent. SKELETON: No acute osseous process though bone windows have not been submitted. Degenerative cervical spine resulting an moderate to severe RIGHT C3-4, RIGHT C4-5, bilateral severe C5-6 neural foraminal narrowing. Patient is edentulous. OTHER NECK: Soft tissues of the neck are non-acute though, not tailored for evaluation. 5 mm superficial LEFT lower facial nodule, recommend direct inspection. CTA HEAD ANTERIOR CIRCULATION: Patent cervical internal carotid arteries, petrous, cavernous and supra clinoid internal carotid arteries. Mild luminal irregularity proximal supraclinoid internal carotid artery compatible with atherosclerosis. 2 mm medially directed LEFT  carotid terminus aneurysm. Widely patent anterior communicating artery. Moderate luminal irregularity of the anterior middle cerebral arteries compatible with atherosclerosis. No large vessel occlusion, hemodynamically significant stenosis, dissection, contrast extravasation. POSTERIOR CIRCULATION: Patent vertebral arteries, vertebrobasilar junction and basilar artery, as well as main branch vessels. Mild stenosis distal RIGHT V4 segment. Mild luminal irregularity of the basilar artery. Moderate tandem stenoses bilateral posterior cerebral arteries. Patent posterior cerebral arteries. No large vessel occlusion, hemodynamically significant stenosis, dissection, contrast extravasation or aneurysm. VENOUS SINUSES: Major dural venous sinuses are patent though not tailored for evaluation on this angiographic examination. ANATOMIC VARIANTS: None. DELAYED PHASE: Not performed. IMPRESSION: CTA NECK: Bilateral cervical internal carotid  artery luminal irregularity most characteristic of fibromuscular dysplasia with moderate stenosis RIGHT mid RIGHT ICA, suggesting old dissection without flow limiting stenosis. No hemodynamically significant stenosis or acute vascular process in the neck. CTA HEAD:  No emergent large vessel occlusion or severe stenosis. Moderate intracranial atherosclerosis. 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 Acute findings discussed with and reconfirmed by Dr.ERIC Cheral Marker on 04/12/2016 at 4:10 am. Electronically Signed   By: Elon Alas M.D.   On: 04/12/2016 04:18   Dg Chest Portable 1 View  Result Date: 04/12/2016 CLINICAL DATA:  Altered mental status, fever, tachycardia. EXAM: PORTABLE CHEST 1 VIEW COMPARISON:  Chest radiograph August 16, 2015 FINDINGS: Cardiomediastinal silhouette is normal. No pleural effusions. Patchy bibasilar airspace opacities with nodular densities RIGHT lung base. Biapical pleural thickening. Trachea projects midline and there is no pneumothorax. Soft tissue planes and  included osseous structures are non-suspicious. IMPRESSION: Bibasilar atelectasis, less likely pneumonia. Nodular densities RIGHT lung base. Recommend follow-up PA and lateral views the chest when clinically able. Electronically Signed   By: Elon Alas M.D.   On: 04/12/2016 05:39   Ct Head Code Stroke W/o Cm  Result Date: 04/12/2016 CLINICAL DATA:  Code stroke. Altered mental status, LEFT-sided weakness last seen normal at 0030 hours. Possible drug overdose. History of hypertension. EXAM: CT HEAD WITHOUT CONTRAST TECHNIQUE: Contiguous axial images were obtained from the base of the skull through the vertex without intravenous contrast. COMPARISON:  CT HEAD March 31, 2016 FINDINGS: Mild motion degraded examination. BRAIN: The ventricles and sulci are normal. No intraparenchymal hemorrhage, mass effect nor midline shift. No acute large vascular territory infarcts. No abnormal extra-axial fluid collections. Basal cisterns are patent. VASCULAR: Unremarkable. SKULL/SOFT TISSUES: No skull fracture. No significant soft tissue swelling. ORBITS/SINUSES: The included ocular globes and orbital contents are normal.The mastoid aircells and included paranasal sinuses are well-aerated. OTHER: None. ASPECTS [facility] Stroke Program Early CT Score) - Ganglionic level infarction (caudate, lentiform nuclei, internal capsule, insula, M1-M3 cortex): 7 - Supraganglionic infarction (M4-M6 cortex): 3 Total score (0-10 with 10 being normal): 10 IMPRESSION: 1. No acute intracranial process; negative CT HEAD for age. 2. ASPECTS is 10. Critical Value/emergent results were called by telephone at the time of interpretation on 04/12/2016 at 3:53 am to Dr. Cheral Marker, who verbally acknowledged these results. Electronically Signed   By: Elon Alas M.D.   On: 04/12/2016 03:56    Procedures Procedures (including critical care time)  Medications Ordered in ED Medications  alteplase (ACTIVASE) 1 mg/mL infusion 52 mg (0 mg/kg  57.6  kg Intravenous Stopped 04/12/16 0538)    Followed by  0.9 %  sodium chloride infusion (not administered)   stroke: mapping our early stages of recovery book (not administered)  0.9 %  sodium chloride infusion (not administered)  acetaminophen (TYLENOL) tablet 650 mg (not administered)    Or  acetaminophen (TYLENOL) solution 650 mg (not administered)    Or  acetaminophen (TYLENOL) suppository 650 mg (not administered)  senna-docusate (Senokot-S) tablet 1 tablet (not administered)  pantoprazole (PROTONIX) injection 40 mg (not administered)  labetalol (NORMODYNE,TRANDATE) injection 20 mg (20 mg Intravenous Given 04/12/16 0552)    And  clevidipine (CLEVIPREX) infusion 0.5 mg/mL (not administered)  ampicillin (OMNIPEN) 2 g in sodium chloride 0.9 % 50 mL IVPB (not administered)  cefTRIAXone (ROCEPHIN) 2 g in dextrose 5 % 50 mL IVPB (not administered)  acyclovir (ZOVIRAX) 550 mg in dextrose 5 % 100 mL IVPB (not administered)  acyclovir (ZOVIRAX) 550 mg in dextrose 5 % 100  mL IVPB (not administered)  vancomycin (VANCOCIN) 500 mg in sodium chloride 0.9 % 100 mL IVPB (not administered)  dexamethasone (DECADRON) injection 4 mg (not administered)  ondansetron (ZOFRAN) injection 4 mg (not administered)  iopamidol (ISOVUE-370) 76 % injection (  Contrast Given 04/12/16 0330)  sodium chloride 0.9 % bolus 1,000 mL (0 mLs Intravenous Stopped 04/12/16 0601)    And  sodium chloride 0.9 % bolus 500 mL (0 mLs Intravenous Stopped 04/12/16 0623)    And  sodium chloride 0.9 % bolus 250 mL (0 mLs Intravenous Stopped 04/12/16 0629)  dexamethasone (DECADRON) injection 10 mg (10 mg Intravenous Given 04/12/16 0621)  cefTRIAXone (ROCEPHIN) 2 g in dextrose 5 % 50 mL IVPB (2 g Intravenous New Bag/Given 04/12/16 [id number])  vancomycin (VANCOCIN) IVPB 1000 mg/200 mL premix (0 mg Intravenous Stopped 04/12/16 0536)  ampicillin (OMNIPEN) 2 g in sodium chloride 0.9 % 50 mL IVPB (0 g Intravenous Stopped 04/12/16 0623)  ondansetron (ZOFRAN)  injection 4 mg (4 mg Intravenous Given 04/12/16 0401)     Initial Impression / Assessment and Plan / ED Course  I have reviewed the triage vital signs and the nursing notes.  Pertinent labs & imaging results that were available during my care of the patient were reviewed by me and considered in my medical decision making (see chart for details).     Patient brought to the ER by EMS. She was picked up from her home. Patient was noted to have a profound left hemiparesis and confusion by EMS. Code stroke was activated. During their workup, however, they discovered that the patient was febrile. She also was found with multiple pill bottles empty, concern for possible overdose. Fever was confirmed at arrival. She is tachycardic and confused. Code sepsis was initiated. Patient was brought to radiology for screening CT. Decision to treat with TPA was made by neurology. Patient care assumed by neurology and critical care.  Final Clinical Impressions(s) / ED Diagnoses   Final diagnoses:  Stroke (cerebrum) ([facility])  Fever    New Prescriptions New Prescriptions   No medications on file  I personally performed the services described in this documentation, which was scribed in my presence. The recorded information has been reviewed and is accurate.     Orpah Greek, MD 04/12/16 [phone]

## 2016-04-12 NOTE — ED Notes (Signed)
Pt becoming more tachypnea, neurologist made aware,  Family at bedside.

## 2016-04-12 NOTE — ED Triage Notes (Signed)
Presents to ed via [facility] , states per husband they were watching TV and fell asleep on the couch around 1230 am. States he woke up to her talking  and confused husband called 911 at 230am. Upon arrival to ED patient had projectile vomiting and only will say "oh God" , left side is flaccid. Moving right side however not on command. Left eye is closed husband states this not normal. Patient will not follow commands.  0424 return to room 31 patient had another episode of projectile vomiting . Pt mental status unchanged. Dr. Cheral Marker remains at bedside, spoke with husband.

## 2016-04-12 NOTE — ED Notes (Signed)
[name] (Son) [phone] Call with updates and questions. Sandie Ano (Sister) [phone] also can be called.

## 2016-04-12 NOTE — ED Notes (Signed)
Son -- [employer]--- CALL FOR ANY QUESTIONS, OR STATUS CHANGES. [phone] Kevan Rosebush -- [id number].

## 2016-04-12 NOTE — ED Notes (Signed)
Neurologist at bedside. 

## 2016-04-12 NOTE — ED Notes (Addendum)
QNS fore INR and PTT I will recollect, main lab aware.

## 2016-04-12 NOTE — ED Notes (Signed)
Pt family states they have noticed a trend. "Pt will pat leg 3 times grab her privates and then swing her right arm to her head. Then pt will repeat."

## 2016-04-12 NOTE — ED Notes (Signed)
Pt moved to Trauma A for intubation. Family made aware,

## 2016-04-12 NOTE — Progress Notes (Signed)
STROKE TEAM PROGRESS NOTE   HISTORY OF PRESENT ILLNESS (per record) [name] is an 66 y.o. female who presents from home via EMS with acute onset of left hemiplegia. LKN was at 12:30 AM 04/12/2016 when she and her partner went to sleep on recliners at home. She and her husband woke up at an unknown time later and he noted her to be complaining of a headache and not behaving normally, so he called 911 at 2:30 AM. On arrival EMS noted her to be confused, her BP was 260/140. They then noted her to be weak on the left. After attempting to get her in the ambulance, she became agitated. Speech was nonsensical consisting of exclamations only. EMS noted several empty to almost empty pill bottles scattered around her place of residence, including a benzodiazepine and an opiate prescription. EMS also noted her to be febrile with tmax of 102.7. HR was 150 and CBG 85. She began projectile vomiting on arrival to the ED. Patient was administered IV t-PA 04/12/2016 at 0435. She is to be admitted to the ICU for further evaluation and treatment.   SUBJECTIVE (INTERVAL HISTORY) Pt was seen at 3pm. Son and sister are at bedside. They stated that pt completely altered now and she is very uncomfortable and something has to be done for her.   Pt apparently left hemiplegia, not following commands or talking. Right arm keep waving around, touch head, touch private area, left leg fidget and moving up and down. Mourning all the time. Obviously in discomfort. However, she can not speak at all. Right eye mild conjunctiva projection, but also seems proptosis vs. Left eye ptosis. PERRL. 3am fever 103.5, but afebrile since then. However, continued tachycardia. BP on the high side. CCM on board, on full range empiric Abx for meningitis and encephalitis coverage.    OBJECTIVE Temp:  [98.1 F (36.7 C)-103.5 F (39.7 C)] 98.1 F (36.7 C) (02/02 1303) Pulse Rate:  [111-165] 113 (02/02 1500) Resp:  [12-46] 19 (02/02 1500) BP:  (135-190)/(77-109) 147/89 (02/02 1500) SpO2:  [89 %-100 %] 94 % (02/02 1500) Weight:  [57.6 kg (127 lb)] 57.6 kg (127 lb) (02/02 0448)  CBC:  Recent Labs Lab 04/12/16 0313 04/12/16 0326  WBC 29.6*  --   NEUTROABS 27.8*  --   HGB 13.1 13.9  HCT 37.9 41.0  MCV 95.0  --   PLT 505*  --     Basic Metabolic Panel:  Recent Labs Lab 04/12/16 0313 04/12/16 0326  NA 137 139  K 4.1 4.1  CL 98* 103  CO2 20*  --   GLUCOSE 105* 112*  BUN 36* 39*  CREATININE 1.13* 1.00  CALCIUM 9.3  --     Lipid Panel: No results found for: CHOL, TRIG, HDL, CHOLHDL, VLDL, LDLCALC HgbA1c:  Lab Results  Component Value Date   HGBA1C  01/22/2007    5.4 (NOTE)   The ADA recommends the following therapeutic goals for glycemic   control related to Hgb A1C measurement:   Goal of Therapy:   < 7.0% Hgb A1C   Action Suggested:  > 8.0% Hgb A1C   Ref:  Diabetes Care, 22, Suppl. 1, 1999   Urine Drug Screen:    Component Value Date/Time   LABOPIA POSITIVE (A) 04/12/2016 0602   COCAINSCRNUR NONE DETECTED 04/12/2016 0602   LABBENZ POSITIVE (A) 04/12/2016 0602   AMPHETMU NONE DETECTED 04/12/2016 0602   THCU NONE DETECTED 04/12/2016 0602   LABBARB NONE DETECTED 04/12/2016 0602  IMAGING I have personally reviewed the radiological images below and agree with the radiology interpretations.  Ct Head Code Stroke W/o Cm 04/12/2016 1. No acute intracranial process; negative CT HEAD for age. 2. ASPECTS is 10.   Ct Angio Head W Or Wo Contrast 04/12/2016 No emergent large vessel occlusion or severe stenosis. Moderate intracranial atherosclerosis. 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 No hemodynamically significant stenosis or acute vascular process in the  neck.   Dg Chest Portable 1 View 04/12/2016 Bibasilar atelectasis, less likely pneumonia. Nodular densities RIGHT lung base. Recommend follow-up PA and lateral views the chest when clinically able.   CTA head and neck 04/01/16 1. No acute intracranial abnormality on noncontrast CT of the head. No abnormal enhancement of the brain. 2. Paranasal sinus disease with a right maxillary fluid level which may represent acute sinusitis in the appropriate clinical setting. 3. Patent carotid and vertebral arteries of the neck without evidence for dissection, hemodynamically significant stenosis, or occlusion. 4. Patent circle of Willis without evidence for proximal occlusion, high-grade stenosis, or aneurysm. 5. Right cerebellar developmental venous anomaly. 6. Bilateral ICA atherosclerosis with mild paraclinoid stenosis.   MRI with and without and MRV pending   PHYSICAL EXAM  Temp:  [98.1 F (36.7 C)-103.5 F (39.7 C)] 98.1 F (36.7 C) (02/02 1303) Pulse Rate:  [111-165] 113 (02/02 1500) Resp:  [12-46] 19 (02/02 1500) BP: (135-190)/(77-109) 147/89 (02/02 1500) SpO2:  [89 %-100 %] 94 % (02/02 1500) Weight:  [127 lb (57.6 kg)] 127 lb (57.6 kg) (02/02 0448)  General - thin fragile, well developed, altered mental stutus.  Ophthalmologic - Fundi not visualized due to noncooperation.  Cardiovascular - Regular rhythm, tachycardia.  Neuro - altered mental status, restless on the right side. No speech outpt, mourning throughout the encounter, not following commands. Not blinking to visual threat, right eye mild conjunctiva projection, right eye proptosis vs. Left eye ptosis. PERRL, doll eye present, corneal present, left facial nasolabial fold flattening. Left hemiplegia, right arm and leg constant moving against gravities. Purposeful but aimless, right arm moving between head and private area, restless. No babinski on the left, no cooperative on the right. Sensation, coordination and gait not able  to test.   ASSESSMENT/PLAN [name] is a 66 y.o. female with history of Fibromyalgia, arthritis, hypertension and substance use due to chronic back pain who developed sudden onset headache and confusion, with elevated blood pressure 260/140. She was very agitated with Tmax 102.7. She developed projectile vomiting. Neurology evaluated left hemiplegia, aphasia and confusion. They treated with IV TPA. On 04/12/2016 at 4:35 AM.   Sudden onset altered mental status, agitation, L hemiplegia, high temperature and projectile vomiting. Concern for sepsis, drug overdose (valium, oxycodone, prednisone), meningitis/encephalitis, endocarditis.  Code stroke CT negative. Aspects is 10   CT angiogram of head and neck no large vessel occlusion. Possible fibromuscular dysplasia with right ICA w/ moderate stenosis suggesting old dissection - imaging capture not able to assess venous sinus thrombosis. CT did not reveal abnormalities at b/l orbits.   MRI and MRV with and without contrast ordered stat - will do after haldol given  EEG pending  2D Echo pending  LDL pending  HgbA1c pending  SCDs for VTE prophylaxis  Diet NPO time specified  No antithrombotic prior to  admission, now on No antithrombotic as within 24h of tPA. Consider antithrombotic if imaging at 24 hours negative for hemorrhage and diagnosis felt to be stroke.  Ongoing aggressive stroke risk factor management  Therapy recommendations:  Pending  Disposition:  Pending  Possible Sepsis syndrome with fever, tachycardia, and lactic acidosis Possible meningitis / encephalitis with fever, AMS Possible right lower lobe aspiration pneumonia  Given Altered mental status and neck stiffness (? Chronic) and fever, Empiric treated for meningitis and encephalitis, PNA - no acyclovir, ampicillin, vanco and rocephin  Unable to do LP s/p tPA  CCM on board  Agitation - given haldol for sedation  Drug overdose  Recently prescribed  oxycodone, diazepam, and prednisone with empty bottles  AMS  Cigarette smoker  will advise to stop smoking  Hypertensive urgency  blood pressure 260/140 in setting of neurologic symptoms. On arrival   Blood pressure goals per post TPA guidelines x 24 hours   BP so far at goal  Other Stroke Risk Factors    Other Active Problems  Chronic back pain - methadone  Narcotic use for chronic back pain   Nausea and vomiting  History GERD  Hospital day # 0  This patient is critically ill due to left hemiplegia s/p tPA, AMS, high grade fever, hypertensive emergency. Drug over dose and at significant risk of neurological worsening, death form brain bleeding, sepsis, septic shock, heart failure, respiratory failure. This patient's care requires constant monitoring of vital signs, hemodynamics, respiratory and cardiac monitoring, review of multiple databases, neurological assessment, discussion with family, other specialists and medical decision making of high complexity. I spent 50 minutes of neurocritical care time in the care of this patient.  Rosalin Hawking, MD PhD Stroke Neurology 04/12/2016 4:43 PM   To contact Stroke Continuity provider, please refer to [email or web address] After hours, contact General Neurology

## 2016-04-12 NOTE — Progress Notes (Signed)
Pharmacy Antibiotic Note  Caitlen Monaco is a 66 y.o. female admitted on 04/12/2016 as code stroke, rec'd tPA, also with concern for meningitis 2/2 c/o HA, LA 5.5, and WBC 29.6.  Pharmacy has been consulted for vancomycin, ceftriaxone, ampicillin, and acyclovir dosing.  Plan: EDP ordered vanc 1g, Rocephin 2g, and ampicillin 2g. Vancomycin 500mg  IV every 12 hours.  Goal trough 15-20 mcg/mL.  Rocephin 2g IV every 12 hours. Ampicillin 2g IV every 4 hours. Acyclovir 550mg  IV every 12 hours.   Height: 5\' 4"  (162.6 cm) Weight: 127 lb (57.6 kg) IBW/kg (Calculated) : 54.7  Temp (24hrs), Avg:103.5 F (39.7 C), Min:103.5 F (39.7 C), Max:103.5 F (39.7 C)   Recent Labs Lab 04/12/16 0313 04/12/16 0326 04/12/16 0425  WBC 29.6*  --   --   CREATININE 1.13* 1.00  --   LATICACIDVEN  --   --  5.58*    Estimated Creatinine Clearance: 49.1 mL/min (by C-G formula based on SCr of 1 mg/dL).    Allergies  Allergen Reactions  . Codeine Nausea And Vomiting  . Nsaids Other (See Comments)    Stomach ulcers     Thank you for allowing pharmacy to be a part of this patient's care.  Wynona Neat, PharmD, BCPS  04/12/2016 5:02 AM

## 2016-04-12 NOTE — Progress Notes (Signed)
PHARMACY - PHYSICIAN COMMUNICATION CRITICAL VALUE ALERT - BLOOD CULTURE IDENTIFICATION (BCID)  Results for orders placed or performed during the hospital encounter of 04/12/16  Blood Culture ID Panel (Reflexed) (Collected: 04/12/2016  4:15 AM)  Result Value Ref Range   Enterococcus species NOT DETECTED NOT DETECTED   Listeria monocytogenes NOT DETECTED NOT DETECTED   Staphylococcus species NOT DETECTED NOT DETECTED   Staphylococcus aureus NOT DETECTED NOT DETECTED   Streptococcus species DETECTED (A) NOT DETECTED   Streptococcus agalactiae NOT DETECTED NOT DETECTED   Streptococcus pneumoniae DETECTED (A) NOT DETECTED   Streptococcus pyogenes NOT DETECTED NOT DETECTED   Acinetobacter baumannii NOT DETECTED NOT DETECTED   Enterobacteriaceae species NOT DETECTED NOT DETECTED   Enterobacter cloacae complex NOT DETECTED NOT DETECTED   Escherichia coli NOT DETECTED NOT DETECTED   Klebsiella oxytoca NOT DETECTED NOT DETECTED   Klebsiella pneumoniae NOT DETECTED NOT DETECTED   Proteus species NOT DETECTED NOT DETECTED   Serratia marcescens NOT DETECTED NOT DETECTED   Haemophilus influenzae NOT DETECTED NOT DETECTED   Neisseria meningitidis NOT DETECTED NOT DETECTED   Pseudomonas aeruginosa NOT DETECTED NOT DETECTED   Candida albicans NOT DETECTED NOT DETECTED   Candida glabrata NOT DETECTED NOT DETECTED   Candida krusei NOT DETECTED NOT DETECTED   Candida parapsilosis NOT DETECTED NOT DETECTED   Candida tropicalis NOT DETECTED NOT DETECTED    Name of physician (or Provider) Contacted: [place]  Changes to prescribed antibiotics required: Currently on Rocephin for meningitis rule out, no changes needed at this time. Narrow once appropriate.  Arrie Senate, PharmD PGY-1 Pharmacy Resident Pager: [phone] 04/12/2016

## 2016-04-12 NOTE — ED Notes (Signed)
Husband went home states he needed to get his phone to call patients son. Patient status unchanged.

## 2016-04-13 ENCOUNTER — Inpatient Hospital Stay (HOSPITAL_COMMUNITY): Payer: Medicare Other

## 2016-04-13 DIAGNOSIS — Z90710 Acquired absence of both cervix and uterus: Secondary | ICD-10-CM

## 2016-04-13 DIAGNOSIS — B955 Unspecified streptococcus as the cause of diseases classified elsewhere: Secondary | ICD-10-CM

## 2016-04-13 DIAGNOSIS — F17210 Nicotine dependence, cigarettes, uncomplicated: Secondary | ICD-10-CM

## 2016-04-13 DIAGNOSIS — G039 Meningitis, unspecified: Secondary | ICD-10-CM

## 2016-04-13 DIAGNOSIS — Z885 Allergy status to narcotic agent status: Secondary | ICD-10-CM

## 2016-04-13 DIAGNOSIS — Z8249 Family history of ischemic heart disease and other diseases of the circulatory system: Secondary | ICD-10-CM

## 2016-04-13 DIAGNOSIS — Z886 Allergy status to analgesic agent status: Secondary | ICD-10-CM

## 2016-04-13 DIAGNOSIS — J9600 Acute respiratory failure, unspecified whether with hypoxia or hypercapnia: Secondary | ICD-10-CM

## 2016-04-13 DIAGNOSIS — Z820 Family history of epilepsy and other diseases of the nervous system: Secondary | ICD-10-CM

## 2016-04-13 DIAGNOSIS — G8194 Hemiplegia, unspecified affecting left nondominant side: Secondary | ICD-10-CM

## 2016-04-13 DIAGNOSIS — F1910 Other psychoactive substance abuse, uncomplicated: Secondary | ICD-10-CM

## 2016-04-13 DIAGNOSIS — Z978 Presence of other specified devices: Secondary | ICD-10-CM

## 2016-04-13 DIAGNOSIS — Z8042 Family history of malignant neoplasm of prostate: Secondary | ICD-10-CM

## 2016-04-13 DIAGNOSIS — G001 Pneumococcal meningitis: Secondary | ICD-10-CM

## 2016-04-13 DIAGNOSIS — Z9049 Acquired absence of other specified parts of digestive tract: Secondary | ICD-10-CM

## 2016-04-13 DIAGNOSIS — G002 Streptococcal meningitis: Secondary | ICD-10-CM

## 2016-04-13 LAB — LIPID PANEL
Cholesterol: 65 mg/dL (ref 0–200)
HDL: 30 mg/dL — ABNORMAL LOW (ref >40)
LDL Cholesterol: 16 mg/dL (ref 0–99)
Total CHOL/HDL Ratio: 2.2 RATIO
Triglycerides: 94 mg/dL (ref <150)
VLDL: 19 mg/dL (ref 0–40)

## 2016-04-13 LAB — BASIC METABOLIC PANEL
Anion gap: 9 (ref 5–15)
BUN: 17 mg/dL (ref 6–20)
CO2: 26 mmol/L (ref 22–32)
Calcium: 7.8 mg/dL — ABNORMAL LOW (ref 8.9–10.3)
Chloride: 106 mmol/L (ref 101–111)
Creatinine, Ser: 0.78 mg/dL (ref 0.44–1.00)
GFR calc Af Amer: >60 mL/min (ref >60)
GFR calc non Af Amer: >60 mL/min (ref >60)
Glucose, Bld: 108 mg/dL — ABNORMAL HIGH (ref 65–99)
Potassium: 4.3 mmol/L (ref 3.5–5.1)
Sodium: 141 mmol/L (ref 135–145)

## 2016-04-13 LAB — CBC
HCT: 27 % — ABNORMAL LOW (ref 36.0–46.0)
Hemoglobin: 9.4 g/dL — ABNORMAL LOW (ref 12.0–15.0)
MCH: 32.4 pg (ref 26.0–34.0)
MCHC: 34.8 g/dL (ref 30.0–36.0)
MCV: 93.1 fL (ref 78.0–100.0)
Platelets: 389 10*3/uL (ref 150–400)
RBC: 2.9 MIL/uL — ABNORMAL LOW (ref 3.87–5.11)
RDW: 13.5 % (ref 11.5–15.5)
WBC: 39.4 10*3/uL — ABNORMAL HIGH (ref 4.0–10.5)

## 2016-04-13 LAB — URINE CULTURE: Culture: NO GROWTH

## 2016-04-13 LAB — GLUCOSE, CAPILLARY: Glucose-Capillary: 111 mg/dL — ABNORMAL HIGH (ref 65–99)

## 2016-04-13 LAB — PROCALCITONIN: Procalcitonin: 74.56 ng/mL

## 2016-04-13 LAB — LACTIC ACID, PLASMA: Lactic Acid, Venous: 0.9 mmol/L (ref 0.5–1.9)

## 2016-04-13 LAB — PHOSPHORUS: Phosphorus: 2.9 mg/dL (ref 2.5–4.6)

## 2016-04-13 LAB — MAGNESIUM: Magnesium: 1.6 mg/dL — ABNORMAL LOW (ref 1.7–2.4)

## 2016-04-13 MED ORDER — HEPARIN (PORCINE) IN NACL 100-0.45 UNIT/ML-% IJ SOLN
1100.0000 [IU]/h | INTRAMUSCULAR | Status: DC
  Administered 2016-04-13: 700 [IU]/h via INTRAVENOUS
  Administered 2016-04-14: 1250 [IU]/h via INTRAVENOUS
  Administered 2016-04-15 – 2016-04-16 (×2): 1350 [IU]/h via INTRAVENOUS
  Administered 2016-04-17: 1150 [IU]/h via INTRAVENOUS
  Filled 2016-04-13 (×9): qty 250

## 2016-04-13 MED ORDER — GADOBENATE DIMEGLUMINE 529 MG/ML IV SOLN
11.0000 mL | Freq: Once | INTRAVENOUS | Status: AC | PRN
  Administered 2016-04-13: 11 mL via INTRAVENOUS

## 2016-04-13 MED ORDER — SODIUM CHLORIDE 0.9 % IV SOLN
1000.0000 mg | Freq: Once | INTRAVENOUS | Status: AC
  Administered 2016-04-13: 1000 mg via INTRAVENOUS
  Filled 2016-04-13: qty 10

## 2016-04-13 MED ORDER — SODIUM CHLORIDE 0.9 % IV SOLN
500.0000 mg | Freq: Two times a day (BID) | INTRAVENOUS | Status: DC
  Administered 2016-04-14 – 2016-04-19 (×11): 500 mg via INTRAVENOUS
  Filled 2016-04-13 (×12): qty 5

## 2016-04-13 MED ORDER — SODIUM CHLORIDE 0.9 % IV SOLN
INTRAVENOUS | Status: DC
  Administered 2016-04-14 – 2016-04-16 (×2): via INTRAVENOUS
  Administered 2016-04-22: 1000 mL via INTRAVENOUS
  Administered 2016-04-22: 05:00:00 via INTRAVENOUS

## 2016-04-13 NOTE — Progress Notes (Signed)
PT Cancellation Note  Patient Details Name: [name] MRN: [id number] DOB: Jul 25, 1950   Cancelled Treatment:    Reason Eval/Treat Not Completed: Patient not medically ready.  pt remains on bedrest.  Please advance activity order once appropriate for PT and mobility.     [place] 04/13/2016, 8:15 AM

## 2016-04-13 NOTE — Progress Notes (Signed)
PULMONARY / CRITICAL CARE MEDICINE   Name: [name] MRN: [id number] DOB: 02-19-1951    ADMISSION DATE:  04/12/2016 CONSULTATION DATE:  04/12/16  REFERRING MD:  Cheral Marker  CHIEF COMPLAINT:  AMS  HISTORY OF PRESENT ILLNESS:  Pt is encephelopathic; therefore, this HPI is obtained from chart review. [name] is a 66 y.o. female with PMH as outlined below. She presented to [facility] ED 02/02 with acute onset left sided weakness and dysphasia that started between midnight and 1am.  Per her husband, pt has chronic neck pain but this had gotten worse over the past few days.  They saw PCP who prescribed her a prednisone pack and flexeril (she is chronically on methadone and valium; though valium is not listed on outpatient med list).  Pt and husband then went to sleep just before midnight and husband then woke up around 1am and found her to have above symptoms.  En route to ED, she had projectile vomiting.  In ED, she had fever (Tmax 102.7), tachycardia with rates in 150's, altered mental status, dysphasia, left sided weakness.  EMS informed EDP that there was concern for prescription drug abuse since pt had bottles of hydrocodone recently filled 4 days ago but only had 2 pills left, as well as diazepam bottle that was empty.  CT head was obtained and was normal.  She was evaluated by neurology who felt that she was a tPA candidate, she received this at 0435.  She was also started on empiric coverage for meningitis.  She had no improvement after tPA;   SUBJECTIVE:   Interval events > patient intubated for airway protection and agitation am 2/2 MRI brain was performed as below, consistent with meningitis / encephalitis Blood cx positive for S pneumo ID consulted and abx adjusted 2/3 as below  VITAL SIGNS: BP (!) 168/97   Pulse (!) 117   Temp 99.4 F (37.4 C) (Axillary)   Resp (!) 21   Ht 5\' 4"  (1.626 m)   Wt 57.6 kg (127 lb)   SpO2 98%   BMI 21.80 kg/m   HEMODYNAMICS:    VENTILATOR  SETTINGS: Vent Mode: PSV;CPAP FiO2 (%):  [30 %-100 %] 30 % Set Rate:  [16 bmp] 16 bmp Vt Set:  [500 mL] 500 mL PEEP:  [5 cmH20] 5 cmH20 Pressure Support:  [5 cmH20] 5 cmH20 Plateau Pressure:  [16 cmH20-20 cmH20] 16 cmH20  INTAKE / OUTPUT: I/O last 3 completed shifts: In: 6244.6 [I.V.:1666.9; IV Piggyback:4577.7] Out: 2620 [Urine:2620]   PHYSICAL EXAMINATION: General: Intubated and sedated Neuro: tries to open eyes to voice and stim. Will not follow commands.  HEENT: ETT in place, unable to assess neck pain Cardiovascular: regular, no M Lungs: clear bilaterally Abdomen: soft, benign Musculoskeletal: no deformities Skin: no rash  LABS:  BMET  Recent Labs Lab 04/12/16 0313 04/12/16 0326 04/13/16 0234  NA 137 139 141  K 4.1 4.1 4.3  CL 98* 103 106  CO2 20*  --  26  BUN 36* 39* 17  CREATININE 1.13* 1.00 0.78  GLUCOSE 105* 112* 108*    Electrolytes  Recent Labs Lab 04/12/16 0313 04/13/16 0234  CALCIUM 9.3 7.8*  MG  --  1.6*  PHOS  --  2.9    CBC  Recent Labs Lab 04/12/16 0313 04/12/16 0326 04/13/16 0234  WBC 29.6*  --  39.4*  HGB 13.1 13.9 9.4*  HCT 37.9 41.0 27.0*  PLT 505*  --  389    Coag's  Recent Labs Lab 04/12/16  0406  APTT 25  INR 1.02    Sepsis Markers  Recent Labs Lab 04/12/16 0640  04/12/16 0702 04/12/16 1016 04/13/16 0234 04/13/16 1232  LATICACIDVEN  --   < > 5.45* 4.2*  --  0.9  PROCALCITON 27.30  --   --   --  74.56  --   < > = values in this interval not displayed.  ABG  Recent Labs Lab 04/12/16 1823  PHART 7.433  PCO2ART 43.2  PO2ART 483.0*    Liver Enzymes  Recent Labs Lab 04/12/16 0313  AST 40  ALT 36  ALKPHOS 224*  BILITOT 1.1  ALBUMIN 2.8*    Cardiac Enzymes No results for input(s): TROPONINI, PROBNP in the last 168 hours.  Glucose No results for input(s): GLUCAP in the last 168 hours.  Imaging Mr Jeri Cos [id number] Contrast  Addendum Date: 04/13/2016   ADDENDUM REPORT: 04/13/2016 07:09  ADDENDUM: Study discussed by telephone with Dr. Rosalin Hawking on 04/13/2016 at 0701 hours. On review of the EMR I note that both of the patient's blood cultures yesterday at 0415 hours were positive for gram positive cocci in chains. Electronically Signed   By: Genevie Ann M.D.   On: 04/13/2016 07:09   Result Date: 04/13/2016 CLINICAL DATA:  66 year old female with unexplained acute onset left hemiplegia, hypertensive (systolic 123456) and febrile (102.7) on EMS arrival. Status post IV tPA on 04/12/2016, but CTA head and neck negative for emergent large vessel occlusion or severe stenosis. Query CNS infection or venous sinus thrombosis. Initial encounter. EXAM: MRI HEAD WITHOUT AND WITH CONTRAST MRV HEAD WITHOUT CONTRAST TECHNIQUE: Multiplanar, multiecho pulse sequences of the brain and surrounding structures were obtained without and with intravenous contrast. Angiographic images of the intracranial venous structures were obtained using MRV technique without intravenous contrast. CONTRAST:  27mL MULTIHANCE GADOBENATE DIMEGLUMINE 529 MG/ML IV SOLN COMPARISON:  CTA head and neck 04/12/2016 and earlier head CTs. Cervical spine MRI 09/09/2006. FINDINGS: MRI HEAD: The patient is intubated. There is some fluid layering in the pharynx. Major intracranial vascular flow voids are preserved except for the superior sagittal sinus, see below. The distal right vertebral artery appears dominant. No parenchymal diffusion restriction to suggest acute infarct, however, there are abnormal extra-axial cystic spaces at the vertex (series 6, image 23) which appear septated and mostly CSF isointense but are intermittently restricted on diffusion (series 4, image 43 and series 7, image 11). Furthermore there is diffuse abnormal increased FLAIR signal in the subarachnoid spaces diffusely (series 10, image 20) beyond that expected for artifact due to hyper oxygenation. There also appears to be trace layering debris in both occipital horns (series 10,  image 11 and series 6, image 11). Fluid at the basilar cisterns appears more normal. There is no definite superimposed gyral edema, including no convincing temporal lobe or insula edema. Following contrast there is mild diffuse increased leptomeningeal enhancement throughout the brain. No pachymeningeal thickening. No abnormal parenchymal mass or parenchymal enhancement. The superior sagittal sinus at the vertex has an abnormally diminutive appearance although there does appear to be preserved major cortical vein enhancement (series 15 image 8). There also appears to be hypertrophied skull diploic space venous enhancement. No ventriculomegaly. No acute or chronic cerebral blood products identified. No encephalomalacia. Grossly negative pituitary. Cervicomedullary junction and visualized cervical spine appear within normal limits. Visualized bone marrow signal is within normal limits. Negative orbits soft tissues. Visualized paranasal sinuses and mastoids are stable and well pneumatized. Visible internal auditory structures appear normal. MRV HEAD:  MRV images demonstrate a segment of about 4.5 cm of the superior sagittal sinus with loss of flow signal. However, anterior and posterior to this segment flow is relatively preserved (series 902, image 2). Furthermore, there is an appearance of hypertrophied smaller dural veins in cortical veins about the abnormal superior sagittal sinus segment. There is preserved flow in the torcula, straight sinus, vein of Galen, internal cerebral veins and basal veins of Rosenthal. There is preserved flow signal in the bilateral transverse and sigmoid sinuses, the left are dominant. The above postcontrast images suggest preserved patency of the cavernous sinus. IMPRESSION: 1. Diffusely abnormal subarachnoid space, especially at the vertex where small loculated foci of complex fluid are noted. Trace layering debris in the lateral ventricles. Associated diffuse leptomeningeal  enhancement. This constellation favors an Acute, Complicated Meningitis. 2. There is a 4.5 cm segment of the superior sagittal sinus which appears thrombosed, although there is superimposed hypertrophy of surrounding dural an cortical veins, as well as skull diploic veins. I strongly suspect this segment of thrombosis is secondary to acute infection (#1), rather than the primary insult, and there is no other venous sinus thrombosis. 3. No acute infarct, and no definite cerebral edema to indicate a progression to cerebritis. Electronically Signed: By: Genevie Ann M.D. On: 04/13/2016 06:53   Dg Chest Port 1 View  Result Date: 04/13/2016 CLINICAL DATA:  66 year old female with respiratory failure. Intubated. Smoker. Initial encounter. EXAM: PORTABLE CHEST 1 VIEW COMPARISON:  04/12/2016 and earlier. FINDINGS: Portable AP semi upright view at 0712 hours. Endotracheal tube tip in good position midway between the level the clavicles and carina. Normal cardiac size and mediastinal contours. Calcified aortic atherosclerosis. Allowing for portable technique the lungs are clear. No pneumothorax or pleural effusion. IMPRESSION: 1. Endotracheal tube in good position. 2.  No acute cardiopulmonary abnormality. 3.  Calcified aortic atherosclerosis. Electronically Signed   By: Genevie Ann M.D.   On: 04/13/2016 08:34   Dg Chest Portable 1 View  Result Date: 04/12/2016 CLINICAL DATA:  Hypoxia, post intubation, history hypertension, GERD EXAM: PORTABLE CHEST 1 VIEW COMPARISON:  Portable exam 1731 hours compared to 04/12/2016 at 0525 hours FINDINGS: Tip of endotracheal tube projects 2.8 cm above carina. Normal heart size, mediastinal contours, and pulmonary vascularity. Atherosclerotic calcification aorta. Lungs clear. No pleural effusion or pneumothorax. IMPRESSION: No acute abnormalities. Satisfactory endotracheal tube position. Electronically Signed   By: Lavonia Dana M.D.   On: 04/12/2016 17:43   Mr Mrv Miguel Dibble [id number] Cm  Addendum Date:  04/13/2016   ADDENDUM REPORT: 04/13/2016 07:09 ADDENDUM: Study discussed by telephone with Dr. Rosalin Hawking on 04/13/2016 at 0701 hours. On review of the EMR I note that both of the patient's blood cultures yesterday at 0415 hours were positive for gram positive cocci in chains. Electronically Signed   By: Genevie Ann M.D.   On: 04/13/2016 07:09   Result Date: 04/13/2016 CLINICAL DATA:  66 year old female with unexplained acute onset left hemiplegia, hypertensive (systolic 123456) and febrile (102.7) on EMS arrival. Status post IV tPA on 04/12/2016, but CTA head and neck negative for emergent large vessel occlusion or severe stenosis. Query CNS infection or venous sinus thrombosis. Initial encounter. EXAM: MRI HEAD WITHOUT AND WITH CONTRAST MRV HEAD WITHOUT CONTRAST TECHNIQUE: Multiplanar, multiecho pulse sequences of the brain and surrounding structures were obtained without and with intravenous contrast. Angiographic images of the intracranial venous structures were obtained using MRV technique without intravenous contrast. CONTRAST:  11mL MULTIHANCE GADOBENATE DIMEGLUMINE 529 MG/ML IV SOLN  COMPARISON:  CTA head and neck 04/12/2016 and earlier head CTs. Cervical spine MRI 09/09/2006. FINDINGS: MRI HEAD: The patient is intubated. There is some fluid layering in the pharynx. Major intracranial vascular flow voids are preserved except for the superior sagittal sinus, see below. The distal right vertebral artery appears dominant. No parenchymal diffusion restriction to suggest acute infarct, however, there are abnormal extra-axial cystic spaces at the vertex (series 6, image 23) which appear septated and mostly CSF isointense but are intermittently restricted on diffusion (series 4, image 43 and series 7, image 11). Furthermore there is diffuse abnormal increased FLAIR signal in the subarachnoid spaces diffusely (series 10, image 20) beyond that expected for artifact due to hyper oxygenation. There also appears to be trace  layering debris in both occipital horns (series 10, image 11 and series 6, image 11). Fluid at the basilar cisterns appears more normal. There is no definite superimposed gyral edema, including no convincing temporal lobe or insula edema. Following contrast there is mild diffuse increased leptomeningeal enhancement throughout the brain. No pachymeningeal thickening. No abnormal parenchymal mass or parenchymal enhancement. The superior sagittal sinus at the vertex has an abnormally diminutive appearance although there does appear to be preserved major cortical vein enhancement (series 15 image 8). There also appears to be hypertrophied skull diploic space venous enhancement. No ventriculomegaly. No acute or chronic cerebral blood products identified. No encephalomalacia. Grossly negative pituitary. Cervicomedullary junction and visualized cervical spine appear within normal limits. Visualized bone marrow signal is within normal limits. Negative orbits soft tissues. Visualized paranasal sinuses and mastoids are stable and well pneumatized. Visible internal auditory structures appear normal. MRV HEAD: MRV images demonstrate a segment of about 4.5 cm of the superior sagittal sinus with loss of flow signal. However, anterior and posterior to this segment flow is relatively preserved (series 902, image 2). Furthermore, there is an appearance of hypertrophied smaller dural veins in cortical veins about the abnormal superior sagittal sinus segment. There is preserved flow in the torcula, straight sinus, vein of Galen, internal cerebral veins and basal veins of Rosenthal. There is preserved flow signal in the bilateral transverse and sigmoid sinuses, the left are dominant. The above postcontrast images suggest preserved patency of the cavernous sinus. IMPRESSION: 1. Diffusely abnormal subarachnoid space, especially at the vertex where small loculated foci of complex fluid are noted. Trace layering debris in the lateral  ventricles. Associated diffuse leptomeningeal enhancement. This constellation favors an Acute, Complicated Meningitis. 2. There is a 4.5 cm segment of the superior sagittal sinus which appears thrombosed, although there is superimposed hypertrophy of surrounding dural an cortical veins, as well as skull diploic veins. I strongly suspect this segment of thrombosis is secondary to acute infection (#1), rather than the primary insult, and there is no other venous sinus thrombosis. 3. No acute infarct, and no definite cerebral edema to indicate a progression to cerebritis. Electronically Signed: By: Genevie Ann M.D. On: 04/13/2016 06:53     STUDIES:  CT head 02/02 > negative. MRI brain 02/02 > diffuse subarachnoid leptomeningeal enhancement, trace layering debris, 4.5cm superior sagittal sinus thrombosis TTE 2/2 >> no vegetations, normal LV fxn, grade 1 diastolic dysfxn  CULTURES: Blood 02/02 > S pneumo Urine 02/02 >  ANTIBIOTICS: Vanc 02/02 > 2/3 Ceftriaxone 02/02 > Ampicillin 02/02 > 2/3 Acyclovir 02/02 > 2/3  SIGNIFICANT EVENTS: 02/02 > admit.  LINES/TUBES: ETT 2/3 >>   DISCUSSION: 66 y.o. female admitted 02/02 with acute encephalopathy and concern for acute CVA.  She received  tPA at 0435 but had no improvement afterwards.  Intubated 2/2 for airway protection. MRI and blood cx's show S pneumo meningitis   ASSESSMENT / PLAN:  NEUROLOGIC A:   Acute encephalopathy Acute S pneumo meningitis / encephalitis Hx chronic back pain (with subsequent narcotic abuse), fibromyalgia, arthritis. P:   ID consulted abx changed to ceftriaxone alone Sedation with propofol, wean as able LP would be optimal but she had t-PA on board Will need TEE before extubation, will arrange beginning of the week   INFECTIOUS A:   Sepsis due to pneumococcal meningitis and bacteremia P:   Ceftriaxone as ordered Dexamethasone ordered Follow cx data Consider LP as we go forward if we believe it will add  meaningful data; dx of meningitis seems to have strong evidence  CARDIOVASCULAR A:  Hypertensive urgency. Hx HTN, murmur. P:  Tight BP control Hold carvedilol and lisinopril   PULMONARY A: Acute respiratory failure due to poor airway protection / encephalopathy Tobacco dependence. P:   PRVC 8cc/kg PSV as tolerated Assess for extubation when MS will allow  RENAL A:   Lactic acidosis P:   NS @ 75. Follow BMP, lactate for clearance  GASTROINTESTINAL A:   Nausea with vomiting. Nutrition. Hx GERD. P:   NPO Consider TF's 2/4  HEMATOLOGIC A:   S/p tPA at [id number] on 04/12/16. VTE Prophylaxis. P:  Monitor for signs of bleeding. SCD's only. Follow CBC  ENDOCRINE A:   No acute issues. P:   No interventions required.  Family updated: Husband updated at bedside on 2/2, none present 2/3  Interdisciplinary Family Meeting v Palliative Care Meeting:  Due by: 04/19/16.  Independent CC time 45 minutes  Baltazar Apo, MD, PhD 04/13/2016, 2:26 PM [facility] [phone] or if no answer [phone]

## 2016-04-13 NOTE — Consult Note (Signed)
[facility]  Date of Admission:  04/12/2016  Date of Consult:  04/13/2016  Reason for Consult: Meninigitis/Encephalitis Referring Physician: Erlinda Hong  Impression/Recommendation Meningitis, encephalitis L hemiplegia Substance abuse Streptococcal bacteremia  Will stop her acyclovir Will stop her amp Getting steroids, meningitis dose She needs TEE She needs repeat BCx She needs HIV and acute hepatitis panel She needs LP when it can be safely done.  Stop isolation (strep is not contagious like mening)  Thank you so much for this interesting consult,   Bobby Rumpf (pager) [phone] [email or web address]  Lakethia Coppess is an 66 y.o. female.  HPI: 66 yo F who was awoken this AM and was found to be acutely confused with headache. EMS noted several bottles of half full opiates and benzos in the home. In ED she developed emesis ("projectile") and was found to be hypertensive (260.140) febrile to 102.7. She was also noted to have L sided weakness.  She was unable to protect her airway in ED and was intubated.  She was given t-PA in ED.  She was started on ceftriaxone/vanco/ampicillin/acyclovir MRI/A:  1. Diffusely abnormal subarachnoid space, especially at the vertex where small loculated foci of complex fluid are noted. Trace layering debris in the lateral ventricles. Associated diffuse leptomeningeal enhancement. This constellation favors an Acute, Complicated Meningitis. 2. There is a 4.5 cm segment of the superior sagittal sinus which appears thrombosed, although there is superimposed hypertrophy of surrounding dural an cortical veins, as well as skull diploic veins. I strongly suspect this segment of thrombosis is secondary to acute infection (#1), rather than the primary insult, and there is no other venous sinus thrombosis. 3. No acute infarct, and no definite cerebral edema to indicate a progression to cerebritis.   Past Medical History:    Diagnosis Date  . Arrhythmia   . Arthritis   . Fibromyalgia   . GERD (gastroesophageal reflux disease)   . H/O: substance abuse 2005   narcotic usage due to chronic back pain  . Heart murmur   . Hypertension   . PONV (postoperative nausea and vomiting)     Past Surgical History:  Procedure Laterality Date  . ABDOMINAL HYSTERECTOMY    . ABDOMINAL SURGERY    . APPENDECTOMY    . BREAST SURGERY    . CHOLECYSTECTOMY N/A 09/30/2012   Procedure: LAPAROSCOPIC CHOLECYSTECTOMY WITH INTRAOPERATIVE CHOLANGIOGRAM;  Surgeon: Imogene Burn. Georgette Dover, MD;  Location: [facility];  Service: General;  Laterality: N/A;  . FRACTURE SURGERY     right upper arm  . OVARIAN CYST SURGERY       Allergies  Allergen Reactions  . Codeine Nausea And Vomiting  . Nsaids Other (See Comments)    Stomach ulcers    Medications:  Scheduled: .  stroke: mapping our early stages of recovery book   Does not apply Once  . sodium chloride  50 mL Intravenous Once  . acyclovir  550 mg Intravenous Q12H  . ampicillin (OMNIPEN) IV  2 g Intravenous Q4H  . cefTRIAXone (ROCEPHIN)  IV  2 g Intravenous Q12H  . chlorhexidine gluconate (MEDLINE KIT)  15 mL Mouth Rinse BID  . dexamethasone  4 mg Intravenous Q6H  . LORazepam  2 mg Intravenous Once  . mouth rinse  15 mL Mouth Rinse 10 times per day  . pantoprazole (PROTONIX) IV  40 mg Intravenous QHS  . vancomycin  500 mg Intravenous Q12H    Abtx:  Anti-infectives    Start  Dose/Rate Route Frequency Ordered Stop   04/12/16 1800  cefTRIAXone (ROCEPHIN) 2 g in dextrose 5 % 50 mL IVPB     2 g 100 mL/hr over 30 Minutes Intravenous Every 12 hours 04/12/16 0514     04/12/16 1700  acyclovir (ZOVIRAX) 550 mg in dextrose 5 % 100 mL IVPB     550 mg 111 mL/hr over 60 Minutes Intravenous Every 12 hours 04/12/16 0514     04/12/16 1600  vancomycin (VANCOCIN) 500 mg in sodium chloride 0.9 % 100 mL IVPB     500 mg 100 mL/hr over 60 Minutes Intravenous Every 12 hours 04/12/16 0514      04/12/16 0800  ampicillin (OMNIPEN) 2 g in sodium chloride 0.9 % 50 mL IVPB     2 g 150 mL/hr over 20 Minutes Intravenous Every 4 hours 04/12/16 0514     04/12/16 0515  acyclovir (ZOVIRAX) 550 mg in dextrose 5 % 100 mL IVPB     550 mg 111 mL/hr over 60 Minutes Intravenous  Once 04/12/16 0514 04/12/16 0800   04/12/16 0400  cefTRIAXone (ROCEPHIN) 2 g in dextrose 5 % 50 mL IVPB     2 g 100 mL/hr over 30 Minutes Intravenous  Once 04/12/16 0357 04/12/16 0658   04/12/16 0400  vancomycin (VANCOCIN) IVPB 1000 mg/200 mL premix     1,000 mg 200 mL/hr over 60 Minutes Intravenous  Once 04/12/16 0357 04/12/16 0536   04/12/16 0400  ampicillin (OMNIPEN) 2 g in sodium chloride 0.9 % 50 mL IVPB     2 g 150 mL/hr over 20 Minutes Intravenous  Once 04/12/16 0357 04/12/16 0623      Total days of antibiotics: 1 ceftriaxone/ampicillin/vanco/acyclovir          Social History:  reports that she has been smoking Cigarettes.  She has a 30.00 pack-year smoking history. She has never used smokeless tobacco. She reports that she does not drink alcohol or use drugs.  Family History  Problem Relation Age of Onset  . Alzheimer's disease Mother   . Cancer Father     prostate  . Heart disease Father     General ROS: unobtainable, pt intubated and sedated.   Blood pressure (!) 144/65, pulse (!) 111, temperature 100.1 F (37.8 C), temperature source Axillary, resp. rate (!) 25, height '5\' 4"'$  (1.626 m), weight 57.6 kg (127 lb), SpO2 98 %. General appearance: no distress and sedated, no vent.  Eyes: pupils =. d/c on L eye. swelling of L conjunctiva.  Throat: ET tube Neck: no adenopathy and she resists movement of her neck.  Lungs: diminished breath sounds bilaterally Heart: regular rate and rhythm Abdomen: normal findings: bowel sounds normal and soft, non-tender Extremities: edema none Skin: multiple scars on LUE. no digital tuft lesions noted on feet or L hand.    Results for orders placed or performed  during the hospital encounter of 04/12/16 (from the past 48 hour(s))  CBC     Status: Abnormal   Collection Time: 04/12/16  3:13 AM  Result Value Ref Range   WBC 29.6 (H) 4.0 - 10.5 K/uL   RBC 3.99 3.87 - 5.11 MIL/uL   Hemoglobin 13.1 12.0 - 15.0 g/dL   HCT 37.9 36.0 - 46.0 %   MCV 95.0 78.0 - 100.0 fL   MCH 32.8 26.0 - 34.0 pg   MCHC 34.6 30.0 - 36.0 g/dL   RDW 14.0 11.5 - 15.5 %   Platelets 505 (H) 150 - 400 K/uL  Differential  Status: Abnormal   Collection Time: 04/12/16  3:13 AM  Result Value Ref Range   Neutrophils Relative % 94 %   Lymphocytes Relative 4 %   Monocytes Relative 2 %   Eosinophils Relative 0 %   Basophils Relative 0 %   Neutro Abs 27.8 (H) 1.7 - 7.7 K/uL   Lymphs Abs 1.2 0.7 - 4.0 K/uL   Monocytes Absolute 0.6 0.1 - 1.0 K/uL   Eosinophils Absolute 0.0 0.0 - 0.7 K/uL   Basophils Absolute 0.0 0.0 - 0.1 K/uL   WBC Morphology TOXIC GRANULATION     Comment: VACUOLATED NEUTROPHILS  Comprehensive metabolic panel     Status: Abnormal   Collection Time: 04/12/16  3:13 AM  Result Value Ref Range   Sodium 137 135 - 145 mmol/L   Potassium 4.1 3.5 - 5.1 mmol/L   Chloride 98 (L) 101 - 111 mmol/L   CO2 20 (L) 22 - 32 mmol/L   Glucose, Bld 105 (H) 65 - 99 mg/dL   BUN 36 (H) 6 - 20 mg/dL   Creatinine, Ser 1.13 (H) 0.44 - 1.00 mg/dL   Calcium 9.3 8.9 - 10.3 mg/dL   Total Protein 7.3 6.5 - 8.1 g/dL   Albumin 2.8 (L) 3.5 - 5.0 g/dL   AST 40 15 - 41 U/L   ALT 36 14 - 54 U/L   Alkaline Phosphatase 224 (H) 38 - 126 U/L   Total Bilirubin 1.1 0.3 - 1.2 mg/dL   GFR calc non Af Amer 50 (L) >60 mL/min   GFR calc Af Amer 58 (L) >60 mL/min    Comment: (NOTE) The eGFR has been calculated using the CKD EPI equation. This calculation has not been validated in all clinical situations. eGFR's persistently <60 mL/min signify possible Chronic Kidney Disease.    Anion gap 19 (H) 5 - 15  I-stat troponin, ED     Status: None   Collection Time: 04/12/16  3:25 AM  Result Value  Ref Range   Troponin i, poc 0.00 0.00 - 0.08 ng/mL   Comment 3            Comment: Due to the release kinetics of cTnI, a negative result within the first hours of the onset of symptoms does not rule out myocardial infarction with certainty. If myocardial infarction is still suspected, repeat the test at appropriate intervals.   I-Stat Chem 8, ED     Status: Abnormal   Collection Time: 04/12/16  3:26 AM  Result Value Ref Range   Sodium 139 135 - 145 mmol/L   Potassium 4.1 3.5 - 5.1 mmol/L   Chloride 103 101 - 111 mmol/L   BUN 39 (H) 6 - 20 mg/dL   Creatinine, Ser 1.00 0.44 - 1.00 mg/dL   Glucose, Bld 112 (H) 65 - 99 mg/dL   Calcium, Ion 1.15 1.15 - 1.40 mmol/L   TCO2 26 0 - 100 mmol/L   Hemoglobin 13.9 12.0 - 15.0 g/dL   HCT 41.0 36.0 - 46.0 %  Blood Culture (routine x 2)     Status: None (Preliminary result)   Collection Time: 04/12/16  4:05 AM  Result Value Ref Range   Specimen Description BLOOD RIGHT HAND    Special Requests IN PEDIATRIC BOTTLE 3ML    Culture  Setup Time      GRAM POSITIVE COCCI IN PAIRS IN PEDIATRIC BOTTLE CRITICAL RESULT CALLED TO, READ BACK BY AND VERIFIED WITH: M MACCIA,PHARMD AT 1610 04/12/16 BY L BENFIELD  Culture      GRAM POSITIVE COCCI CULTURE REINCUBATED FOR BETTER GROWTH    Report Status PENDING   Protime-INR     Status: None   Collection Time: 04/12/16  4:06 AM  Result Value Ref Range   Prothrombin Time 13.4 11.4 - 15.2 seconds   INR 1.02   APTT     Status: None   Collection Time: 04/12/16  4:06 AM  Result Value Ref Range   aPTT 25 24 - 36 seconds  Blood Culture (routine x 2)     Status: None (Preliminary result)   Collection Time: 04/12/16  4:15 AM  Result Value Ref Range   Specimen Description BLOOD LEFT FOREARM    Special Requests BOTTLES DRAWN AEROBIC ONLY 5ML    Culture  Setup Time      GRAM POSITIVE COCCI IN CHAINS AEROBIC BOTTLE ONLY CRITICAL RESULT CALLED TO, READ BACK BY AND VERIFIED WITH: M MACCIA,PHARMD AT 1610 04/12/16  BY L BENFIELD    Culture      GRAM POSITIVE COCCI CULTURE REINCUBATED FOR BETTER GROWTH    Report Status PENDING   Blood Culture ID Panel (Reflexed)     Status: Abnormal   Collection Time: 04/12/16  4:15 AM  Result Value Ref Range   Enterococcus species NOT DETECTED NOT DETECTED   Listeria monocytogenes NOT DETECTED NOT DETECTED   Staphylococcus species NOT DETECTED NOT DETECTED   Staphylococcus aureus NOT DETECTED NOT DETECTED   Streptococcus species DETECTED (A) NOT DETECTED    Comment: CRITICAL RESULT CALLED TO, READ BACK BY AND VERIFIED WITH: M MACCIA,PHARMD AT 1610 04/12/16 BY L BENFIELD    Streptococcus agalactiae NOT DETECTED NOT DETECTED   Streptococcus pneumoniae DETECTED (A) NOT DETECTED    Comment: CRITICAL RESULT CALLED TO, READ BACK BY AND VERIFIED WITH: M MACCIA,PHARMD AT 1610 04/12/16 BY L BENFIELD    Streptococcus pyogenes NOT DETECTED NOT DETECTED   Acinetobacter baumannii NOT DETECTED NOT DETECTED   Enterobacteriaceae species NOT DETECTED NOT DETECTED   Enterobacter cloacae complex NOT DETECTED NOT DETECTED   Escherichia coli NOT DETECTED NOT DETECTED   Klebsiella oxytoca NOT DETECTED NOT DETECTED   Klebsiella pneumoniae NOT DETECTED NOT DETECTED   Proteus species NOT DETECTED NOT DETECTED   Serratia marcescens NOT DETECTED NOT DETECTED   Haemophilus influenzae NOT DETECTED NOT DETECTED   Neisseria meningitidis NOT DETECTED NOT DETECTED   Pseudomonas aeruginosa NOT DETECTED NOT DETECTED   Candida albicans NOT DETECTED NOT DETECTED   Candida glabrata NOT DETECTED NOT DETECTED   Candida krusei NOT DETECTED NOT DETECTED   Candida parapsilosis NOT DETECTED NOT DETECTED   Candida tropicalis NOT DETECTED NOT DETECTED  Osmolality     Status: Abnormal   Collection Time: 04/12/16  4:18 AM  Result Value Ref Range   Osmolality 305 (H) 275 - 295 mOsm/kg    Comment: CRITICAL RESULT CALLED TO, READ BACK BY AND VERIFIED WITH: BROOKE HESTER [id number] '@0737'$  THANEY   I-Stat  CG4 Lactic Acid, ED  (not at  [facility])     Status: Abnormal   Collection Time: 04/12/16  4:25 AM  Result Value Ref Range   Lactic Acid, Venous 5.58 (HH) 0.5 - 1.9 mmol/L   Comment NOTIFIED PHYSICIAN   Urinalysis, Routine w reflex microscopic     Status: Abnormal   Collection Time: 04/12/16  4:56 AM  Result Value Ref Range   Color, Urine YELLOW YELLOW   APPearance CLEAR CLEAR   Specific Gravity, Urine 1.019 1.005 - 1.030  pH 5.0 5.0 - 8.0   Glucose, UA NEGATIVE NEGATIVE mg/dL   Hgb urine dipstick SMALL (A) NEGATIVE   Bilirubin Urine NEGATIVE NEGATIVE   Ketones, ur NEGATIVE NEGATIVE mg/dL   Protein, ur NEGATIVE NEGATIVE mg/dL   Nitrite NEGATIVE NEGATIVE   Leukocytes, UA NEGATIVE NEGATIVE   RBC / HPF 0-5 0 - 5 RBC/hpf   WBC, UA 0-5 0 - 5 WBC/hpf   Bacteria, UA RARE (A) NONE SEEN   Squamous Epithelial / LPF NONE SEEN NONE SEEN   Hyaline Casts, UA PRESENT   Urine culture     Status: None   Collection Time: 04/12/16  4:56 AM  Result Value Ref Range   Specimen Description URINE, RANDOM    Special Requests NONE    Culture NO GROWTH    Report Status 04/13/2016 FINAL   Rapid urine drug screen (hospital performed)     Status: Abnormal   Collection Time: 04/12/16  6:02 AM  Result Value Ref Range   Opiates POSITIVE (A) NONE DETECTED   Cocaine NONE DETECTED NONE DETECTED   Benzodiazepines POSITIVE (A) NONE DETECTED   Amphetamines NONE DETECTED NONE DETECTED   Tetrahydrocannabinol NONE DETECTED NONE DETECTED   Barbiturates NONE DETECTED NONE DETECTED    Comment:        DRUG SCREEN FOR MEDICAL PURPOSES ONLY.  IF CONFIRMATION IS NEEDED FOR ANY PURPOSE, NOTIFY LAB WITHIN 5 DAYS.        LOWEST DETECTABLE LIMITS FOR URINE DRUG SCREEN Drug Class       Cutoff (ng/mL) Amphetamine      1000 Barbiturate      200 Benzodiazepine   161 Tricyclics       096 Opiates          300 Cocaine          300 THC              50   Procalcitonin - Baseline     Status: None   Collection Time: 04/12/16   6:40 AM  Result Value Ref Range   Procalcitonin 27.30 ng/mL    Comment:        Interpretation: PCT >= 10 ng/mL: Important systemic inflammatory response, almost exclusively due to severe bacterial sepsis or septic shock. (NOTE)         ICU PCT Algorithm               Non ICU PCT Algorithm    ----------------------------     ------------------------------         PCT < 0.25 ng/mL                 PCT < 0.1 ng/mL     Stopping of antibiotics            Stopping of antibiotics       strongly encouraged.               strongly encouraged.    ----------------------------     ------------------------------       PCT level decrease by               PCT < 0.25 ng/mL       >= 80% from peak PCT       OR PCT 0.25 - 0.5 ng/mL          Stopping of antibiotics  encouraged.     Stopping of antibiotics           encouraged.    ----------------------------     ------------------------------       PCT level decrease by              PCT >= 0.25 ng/mL       < 80% from peak PCT        AND PCT >= 0.5 ng/mL             Continuing antibiotics                                              encouraged.       Continuing antibiotics            encouraged.    ----------------------------     ------------------------------     PCT level increase compared          PCT > 0.5 ng/mL         with peak PCT AND          PCT >= 0.5 ng/mL             Escalation of antibiotics                                          strongly encouraged.      Escalation of antibiotics        strongly encouraged.   Lactic acid, plasma     Status: Abnormal   Collection Time: 04/12/16  6:45 AM  Result Value Ref Range   Lactic Acid, Venous 5.0 (HH) 0.5 - 1.9 mmol/L    Comment: CRITICAL RESULT CALLED TO, READ BACK BY AND VERIFIED WITH: B.HESTER,RN 0803 04/12/16 CLARK,S   Ethanol     Status: None   Collection Time: 04/12/16  6:45 AM  Result Value Ref Range   Alcohol, Ethyl (B) <5 <5 mg/dL     Comment:        LOWEST DETECTABLE LIMIT FOR SERUM ALCOHOL IS 5 mg/dL FOR MEDICAL PURPOSES ONLY   I-Stat CG4 Lactic Acid, ED  (not at  [facility])     Status: Abnormal   Collection Time: 04/12/16  7:02 AM  Result Value Ref Range   Lactic Acid, Venous 5.45 (HH) 0.5 - 1.9 mmol/L   Comment NOTIFIED PHYSICIAN   Influenza panel by PCR (type A & B)     Status: None   Collection Time: 04/12/16  9:57 AM  Result Value Ref Range   Influenza A By PCR NEGATIVE NEGATIVE   Influenza B By PCR NEGATIVE NEGATIVE    Comment: (NOTE) The Xpert Xpress Flu assay is intended as an aid in the diagnosis of  influenza and should not be used as a sole basis for treatment.  This  assay is FDA approved for nasopharyngeal swab specimens only. Nasal  washings and aspirates are unacceptable for Xpert Xpress Flu testing.   Lactic acid, plasma     Status: Abnormal   Collection Time: 04/12/16 10:16 AM  Result Value Ref Range   Lactic Acid, Venous 4.2 (HH) 0.5 - 1.9 mmol/L    Comment: CRITICAL RESULT CALLED TO, READ BACK BY AND VERIFIED WITH: K.MOON,RN 04/12/16 @ 1122 BY N.LIVINGSTON  I-Stat arterial blood gas, ED     Status: Abnormal   Collection Time: 04/12/16  6:23 PM  Result Value Ref Range   pH, Arterial 7.433 7.350 - 7.450   pCO2 arterial 43.2 32.0 - 48.0 mmHg   pO2, Arterial 483.0 (H) 83.0 - 108.0 mmHg   Bicarbonate 28.7 (H) 20.0 - 28.0 mmol/L   TCO2 30 0 - 100 mmol/L   O2 Saturation 100.0 %   Acid-Base Excess 4.0 (H) 0.0 - 2.0 mmol/L   Patient temperature 38.0 C    Collection site RADIAL, ALLEN'S TEST ACCEPTABLE    Sample type ARTERIAL   MRSA PCR Screening     Status: None   Collection Time: 04/12/16  6:51 PM  Result Value Ref Range   MRSA by PCR NEGATIVE NEGATIVE    Comment:        The GeneXpert MRSA Assay (FDA approved for NASAL specimens only), is one component of a comprehensive MRSA colonization surveillance program. It is not intended to diagnose MRSA infection nor to guide or monitor  treatment for MRSA infections.   Triglycerides     Status: None   Collection Time: 04/12/16  7:12 PM  Result Value Ref Range   Triglycerides 76 <150 mg/dL  Lipid panel     Status: Abnormal   Collection Time: 04/13/16  2:34 AM  Result Value Ref Range   Cholesterol 65 0 - 200 mg/dL   Triglycerides 94 <150 mg/dL   HDL 30 (L) >40 mg/dL   Total CHOL/HDL Ratio 2.2 RATIO   VLDL 19 0 - 40 mg/dL   LDL Cholesterol 16 0 - 99 mg/dL    Comment:        Total Cholesterol/HDL:CHD Risk Coronary Heart Disease Risk Table                     Men   Women  1/2 Average Risk   3.4   3.3  Average Risk       5.0   4.4  2 X Average Risk   9.6   7.1  3 X Average Risk  23.4   11.0        Use the calculated Patient Ratio above and the CHD Risk Table to determine the patient's CHD Risk.        ATP III CLASSIFICATION (LDL):  <100     mg/dL   Optimal  100-129  mg/dL   Near or Above                    Optimal  130-159  mg/dL   Borderline  160-189  mg/dL   High  >190     mg/dL   Very High   Procalcitonin     Status: None   Collection Time: 04/13/16  2:34 AM  Result Value Ref Range   Procalcitonin 74.56 ng/mL    Comment:        Interpretation: PCT >= 10 ng/mL: Important systemic inflammatory response, almost exclusively due to severe bacterial sepsis or septic shock. (NOTE)         ICU PCT Algorithm               Non ICU PCT Algorithm    ----------------------------     ------------------------------         PCT < 0.25 ng/mL                 PCT < 0.1 ng/mL     Stopping of  antibiotics            Stopping of antibiotics       strongly encouraged.               strongly encouraged.    ----------------------------     ------------------------------       PCT level decrease by               PCT < 0.25 ng/mL       >= 80% from peak PCT       OR PCT 0.25 - 0.5 ng/mL          Stopping of antibiotics                                             encouraged.     Stopping of antibiotics            encouraged.    ----------------------------     ------------------------------       PCT level decrease by              PCT >= 0.25 ng/mL       < 80% from peak PCT        AND PCT >= 0.5 ng/mL             Continuing antibiotics                                              encouraged.       Continuing antibiotics            encouraged.    ----------------------------     ------------------------------     PCT level increase compared          PCT > 0.5 ng/mL         with peak PCT AND          PCT >= 0.5 ng/mL             Escalation of antibiotics                                          strongly encouraged.      Escalation of antibiotics        strongly encouraged.   CBC     Status: Abnormal   Collection Time: 04/13/16  2:34 AM  Result Value Ref Range   WBC 39.4 (H) 4.0 - 10.5 K/uL   RBC 2.90 (L) 3.87 - 5.11 MIL/uL   Hemoglobin 9.4 (L) 12.0 - 15.0 g/dL    Comment: DELTA CHECK NOTED REPEATED TO VERIFY    HCT 27.0 (L) 36.0 - 46.0 %   MCV 93.1 78.0 - 100.0 fL   MCH 32.4 26.0 - 34.0 pg   MCHC 34.8 30.0 - 36.0 g/dL   RDW 13.5 11.5 - 15.5 %   Platelets 389 150 - 400 K/uL  Basic metabolic panel     Status: Abnormal   Collection Time: 04/13/16  2:34 AM  Result Value Ref Range   Sodium 141 135 - 145 mmol/L   Potassium 4.3 3.5 - 5.1 mmol/L   Chloride 106 101 - 111 mmol/L  CO2 26 22 - 32 mmol/L   Glucose, Bld 108 (H) 65 - 99 mg/dL   BUN 17 6 - 20 mg/dL   Creatinine, Ser 0.78 0.44 - 1.00 mg/dL   Calcium 7.8 (L) 8.9 - 10.3 mg/dL   GFR calc non Af Amer >60 >60 mL/min   GFR calc Af Amer >60 >60 mL/min    Comment: (NOTE) The eGFR has been calculated using the CKD EPI equation. This calculation has not been validated in all clinical situations. eGFR's persistently <60 mL/min signify possible Chronic Kidney Disease.    Anion gap 9 5 - 15  Magnesium     Status: Abnormal   Collection Time: 04/13/16  2:34 AM  Result Value Ref Range   Magnesium 1.6 (L) 1.7 - 2.4 mg/dL  Phosphorus      Status: None   Collection Time: 04/13/16  2:34 AM  Result Value Ref Range   Phosphorus 2.9 2.5 - 4.6 mg/dL      Component Value Date/Time   SDES URINE, RANDOM 04/12/2016 0456   SPECREQUEST NONE 04/12/2016 0456   CULT NO GROWTH 04/12/2016 0456   REPTSTATUS 04/13/2016 FINAL 04/12/2016 0456   Ct Angio Head W Or Wo Contrast  Result Date: 04/12/2016 CLINICAL DATA:  Last seen normal at midnight. Dense LEFT hemi paresis. Possible drug overdose. History of hypertension. EXAM: CT ANGIOGRAPHY HEAD AND NECK TECHNIQUE: Multidetector CT imaging of the head and neck was performed using the standard protocol during bolus administration of intravenous contrast. Multiplanar CT image reconstructions and MIPs were obtained to evaluate the vascular anatomy. Carotid stenosis measurements (when applicable) are obtained utilizing NASCET criteria, using the distal internal carotid diameter as the denominator. CONTRAST:  50 cc Isovue 370 COMPARISON:  CT HEAD February 09, 2017 at 0323 hours FINDINGS: CTA NECK AORTIC ARCH: Normal appearance of the thoracic arch, normal branch pattern. Mild calcific atherosclerosis of the aortic arch. The origins of the innominate, left Common carotid artery and subclavian artery are widely patent. RIGHT CAROTID SYSTEM: Common carotid artery is widely patent, coursing in a straight line fashion. Mild beaded appearance/luminal irregularity RIGHT carotid artery with moderate mid cervical internal carotid artery stenosis associated with 1-2 mm outpouching associated with FMD. No intimal hematoma or flow limiting stenosis. LEFT CAROTID SYSTEM: Common carotid artery is widely patent, coursing in a straight line fashion. Normal appearance of the carotid bifurcation without hemodynamically significant stenosis by NASCET criteria. Mild beaded appearance/ luminal irregularity internal carotid artery. VERTEBRAL ARTERIES:RIGHT vertebral artery is dominant. Normal appearance of the vertebral arteries, which  appear widely patent. SKELETON: No acute osseous process though bone windows have not been submitted. Degenerative cervical spine resulting an moderate to severe RIGHT C3-4, RIGHT C4-5, bilateral severe C5-6 neural foraminal narrowing. Patient is edentulous. OTHER NECK: Soft tissues of the neck are non-acute though, not tailored for evaluation. 5 mm superficial LEFT lower facial nodule, recommend direct inspection. CTA HEAD ANTERIOR CIRCULATION: Patent cervical internal carotid arteries, petrous, cavernous and supra clinoid internal carotid arteries. Mild luminal irregularity proximal supraclinoid internal carotid artery compatible with atherosclerosis. 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 Widely patent anterior communicating artery. Moderate luminal irregularity of the anterior middle cerebral arteries compatible with atherosclerosis. No large vessel occlusion, hemodynamically significant stenosis, dissection, contrast extravasation. POSTERIOR CIRCULATION: Patent vertebral arteries, vertebrobasilar junction and basilar artery, as well as main branch vessels. Mild stenosis distal RIGHT V4 segment. Mild luminal irregularity of the basilar artery. Moderate tandem stenoses bilateral posterior cerebral arteries. Patent posterior cerebral arteries. No  large vessel occlusion, hemodynamically significant stenosis, dissection, contrast extravasation or aneurysm. VENOUS SINUSES: Major dural venous sinuses are patent though not tailored for evaluation on this angiographic examination. ANATOMIC VARIANTS: None. DELAYED PHASE: Not performed. IMPRESSION: CTA NECK: Bilateral cervical internal carotid artery luminal irregularity most characteristic of fibromuscular dysplasia with moderate stenosis RIGHT mid RIGHT ICA, suggesting old dissection without flow limiting stenosis. No hemodynamically significant stenosis or acute vascular process in the neck. CTA HEAD:  No emergent large vessel occlusion or severe  stenosis. Moderate intracranial atherosclerosis. 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 Acute findings discussed with and reconfirmed by Dr.ERIC Cheral Marker on 04/12/2016 at 4:10 am. Electronically Signed   By: Elon Alas M.D.   On: 04/12/2016 04:18   Ct Angio Neck W Or Wo Contrast  Result Date: 04/12/2016 CLINICAL DATA:  Last seen normal at midnight. Dense LEFT hemi paresis. Possible drug overdose. History of hypertension. EXAM: CT ANGIOGRAPHY HEAD AND NECK TECHNIQUE: Multidetector CT imaging of the head and neck was performed using the standard protocol during bolus administration of intravenous contrast. Multiplanar CT image reconstructions and MIPs were obtained to evaluate the vascular anatomy. Carotid stenosis measurements (when applicable) are obtained utilizing NASCET criteria, using the distal internal carotid diameter as the denominator. CONTRAST:  50 cc Isovue 370 COMPARISON:  CT HEAD February 09, 2017 at 0323 hours FINDINGS: CTA NECK AORTIC ARCH: Normal appearance of the thoracic arch, normal branch pattern. Mild calcific atherosclerosis of the aortic arch. The origins of the innominate, left Common carotid artery and subclavian artery are widely patent. RIGHT CAROTID SYSTEM: Common carotid artery is widely patent, coursing in a straight line fashion. Mild beaded appearance/luminal irregularity RIGHT carotid artery with moderate mid cervical internal carotid artery stenosis associated with 1-2 mm outpouching associated with FMD. No intimal hematoma or flow limiting stenosis. LEFT CAROTID SYSTEM: Common carotid artery is widely patent, coursing in a straight line fashion. Normal appearance of the carotid bifurcation without hemodynamically significant stenosis by NASCET criteria. Mild beaded appearance/ luminal irregularity internal carotid artery. VERTEBRAL ARTERIES:RIGHT vertebral artery is dominant. Normal appearance of the vertebral arteries, which appear widely patent. SKELETON: No acute  osseous process though bone windows have not been submitted. Degenerative cervical spine resulting an moderate to severe RIGHT C3-4, RIGHT C4-5, bilateral severe C5-6 neural foraminal narrowing. Patient is edentulous. OTHER NECK: Soft tissues of the neck are non-acute though, not tailored for evaluation. 5 mm superficial LEFT lower facial nodule, recommend direct inspection. CTA HEAD ANTERIOR CIRCULATION: Patent cervical internal carotid arteries, petrous, cavernous and supra clinoid internal carotid arteries. Mild luminal irregularity proximal supraclinoid internal carotid artery compatible with atherosclerosis. 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 Widely patent anterior communicating artery. Moderate luminal irregularity of the anterior middle cerebral arteries compatible with atherosclerosis. No large vessel occlusion, hemodynamically significant stenosis, dissection, contrast extravasation. POSTERIOR CIRCULATION: Patent vertebral arteries, vertebrobasilar junction and basilar artery, as well as main branch vessels. Mild stenosis distal RIGHT V4 segment. Mild luminal irregularity of the basilar artery. Moderate tandem stenoses bilateral posterior cerebral arteries. Patent posterior cerebral arteries. No large vessel occlusion, hemodynamically significant stenosis, dissection, contrast extravasation or aneurysm. VENOUS SINUSES: Major dural venous sinuses are patent though not tailored for evaluation on this angiographic examination. ANATOMIC VARIANTS: None. DELAYED PHASE: Not performed. IMPRESSION: CTA NECK: Bilateral cervical internal carotid artery luminal irregularity most characteristic of fibromuscular dysplasia with moderate stenosis RIGHT mid RIGHT ICA, suggesting old dissection without flow limiting stenosis. No hemodynamically significant stenosis or acute vascular process in the  neck. CTA HEAD:  No emergent large vessel occlusion or severe stenosis. Moderate intracranial  atherosclerosis. 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 Acute findings discussed with and reconfirmed by Dr.ERIC Cheral Marker on 04/12/2016 at 4:10 am. Electronically Signed   By: Elon Alas M.D.   On: 04/12/2016 04:18   Mr Jeri Cos ZO Contrast  Addendum Date: 04/13/2016   ADDENDUM REPORT: 04/13/2016 07:09 ADDENDUM: Study discussed by telephone with Dr. Rosalin Hawking on 04/13/2016 at 0701 hours. On review of the EMR I note that both of the patient's blood cultures yesterday at 0415 hours were positive for gram positive cocci in chains. Electronically Signed   By: Genevie Ann M.D.   On: 04/13/2016 07:09   Result Date: 04/13/2016 CLINICAL DATA:  66 year old female with unexplained acute onset left hemiplegia, hypertensive (systolic 109) and febrile (102.7) on EMS arrival. Status post IV tPA on 04/12/2016, but CTA head and neck negative for emergent large vessel occlusion or severe stenosis. Query CNS infection or venous sinus thrombosis. Initial encounter. EXAM: MRI HEAD WITHOUT AND WITH CONTRAST MRV HEAD WITHOUT CONTRAST TECHNIQUE: Multiplanar, multiecho pulse sequences of the brain and surrounding structures were obtained without and with intravenous contrast. Angiographic images of the intracranial venous structures were obtained using MRV technique without intravenous contrast. CONTRAST:  4m MULTIHANCE GADOBENATE DIMEGLUMINE 529 MG/ML IV SOLN COMPARISON:  CTA head and neck 04/12/2016 and earlier head CTs. Cervical spine MRI 09/09/2006. FINDINGS: MRI HEAD: The patient is intubated. There is some fluid layering in the pharynx. Major intracranial vascular flow voids are preserved except for the superior sagittal sinus, see below. The distal right vertebral artery appears dominant. No parenchymal diffusion restriction to suggest acute infarct, however, there are abnormal extra-axial cystic spaces at the vertex (series 6, image 23) which appear septated and mostly CSF isointense but are intermittently restricted on  diffusion (series 4, image 43 and series 7, image 11). Furthermore there is diffuse abnormal increased FLAIR signal in the subarachnoid spaces diffusely (series 10, image 20) beyond that expected for artifact due to hyper oxygenation. There also appears to be trace layering debris in both occipital horns (series 10, image 11 and series 6, image 11). Fluid at the basilar cisterns appears more normal. There is no definite superimposed gyral edema, including no convincing temporal lobe or insula edema. Following contrast there is mild diffuse increased leptomeningeal enhancement throughout the brain. No pachymeningeal thickening. No abnormal parenchymal mass or parenchymal enhancement. The superior sagittal sinus at the vertex has an abnormally diminutive appearance although there does appear to be preserved major cortical vein enhancement (series 15 image 8). There also appears to be hypertrophied skull diploic space venous enhancement. No ventriculomegaly. No acute or chronic cerebral blood products identified. No encephalomalacia. Grossly negative pituitary. Cervicomedullary junction and visualized cervical spine appear within normal limits. Visualized bone marrow signal is within normal limits. Negative orbits soft tissues. Visualized paranasal sinuses and mastoids are stable and well pneumatized. Visible internal auditory structures appear normal. MRV HEAD: MRV images demonstrate a segment of about 4.5 cm of the superior sagittal sinus with loss of flow signal. However, anterior and posterior to this segment flow is relatively preserved (series 902, image 2). Furthermore, there is an appearance of hypertrophied smaller dural veins in cortical veins about the abnormal superior sagittal sinus segment. There is preserved flow in the torcula, straight sinus, vein of Galen, internal cerebral veins and basal veins of Rosenthal. There is preserved flow signal in the bilateral transverse and sigmoid sinuses, the left  are  dominant. The above postcontrast images suggest preserved patency of the cavernous sinus. IMPRESSION: 1. Diffusely abnormal subarachnoid space, especially at the vertex where small loculated foci of complex fluid are noted. Trace layering debris in the lateral ventricles. Associated diffuse leptomeningeal enhancement. This constellation favors an Acute, Complicated Meningitis. 2. There is a 4.5 cm segment of the superior sagittal sinus which appears thrombosed, although there is superimposed hypertrophy of surrounding dural an cortical veins, as well as skull diploic veins. I strongly suspect this segment of thrombosis is secondary to acute infection (#1), rather than the primary insult, and there is no other venous sinus thrombosis. 3. No acute infarct, and no definite cerebral edema to indicate a progression to cerebritis. Electronically Signed: By: Genevie Ann M.D. On: 04/13/2016 06:53   Dg Chest Port 1 View  Result Date: 04/13/2016 CLINICAL DATA:  66 year old female with respiratory failure. Intubated. Smoker. Initial encounter. EXAM: PORTABLE CHEST 1 VIEW COMPARISON:  04/12/2016 and earlier. FINDINGS: Portable AP semi upright view at 0712 hours. Endotracheal tube tip in good position midway between the level the clavicles and carina. Normal cardiac size and mediastinal contours. Calcified aortic atherosclerosis. Allowing for portable technique the lungs are clear. No pneumothorax or pleural effusion. IMPRESSION: 1. Endotracheal tube in good position. 2.  No acute cardiopulmonary abnormality. 3.  Calcified aortic atherosclerosis. Electronically Signed   By: Genevie Ann M.D.   On: 04/13/2016 08:34   Dg Chest Portable 1 View  Result Date: 04/12/2016 CLINICAL DATA:  Hypoxia, post intubation, history hypertension, GERD EXAM: PORTABLE CHEST 1 VIEW COMPARISON:  Portable exam 1731 hours compared to 04/12/2016 at 0525 hours FINDINGS: Tip of endotracheal tube projects 2.8 cm above carina. Normal heart size, mediastinal  contours, and pulmonary vascularity. Atherosclerotic calcification aorta. Lungs clear. No pleural effusion or pneumothorax. IMPRESSION: No acute abnormalities. Satisfactory endotracheal tube position. Electronically Signed   By: Lavonia Dana M.D.   On: 04/12/2016 17:43   Dg Chest Portable 1 View  Result Date: 04/12/2016 CLINICAL DATA:  Altered mental status, fever, tachycardia. EXAM: PORTABLE CHEST 1 VIEW COMPARISON:  Chest radiograph August 16, 2015 FINDINGS: Cardiomediastinal silhouette is normal. No pleural effusions. Patchy bibasilar airspace opacities with nodular densities RIGHT lung base. Biapical pleural thickening. Trachea projects midline and there is no pneumothorax. Soft tissue planes and included osseous structures are non-suspicious. IMPRESSION: Bibasilar atelectasis, less likely pneumonia. Nodular densities RIGHT lung base. Recommend follow-up PA and lateral views the chest when clinically able. Electronically Signed   By: Elon Alas M.D.   On: 04/12/2016 05:39   Mr Mrv Miguel Dibble ZH Cm  Addendum Date: 04/13/2016   ADDENDUM REPORT: 04/13/2016 07:09 ADDENDUM: Study discussed by telephone with Dr. Rosalin Hawking on 04/13/2016 at 0701 hours. On review of the EMR I note that both of the patient's blood cultures yesterday at 0415 hours were positive for gram positive cocci in chains. Electronically Signed   By: Genevie Ann M.D.   On: 04/13/2016 07:09   Result Date: 04/13/2016 CLINICAL DATA:  66 year old female with unexplained acute onset left hemiplegia, hypertensive (systolic 086) and febrile (102.7) on EMS arrival. Status post IV tPA on 04/12/2016, but CTA head and neck negative for emergent large vessel occlusion or severe stenosis. Query CNS infection or venous sinus thrombosis. Initial encounter. EXAM: MRI HEAD WITHOUT AND WITH CONTRAST MRV HEAD WITHOUT CONTRAST TECHNIQUE: Multiplanar, multiecho pulse sequences of the brain and surrounding structures were obtained without and with intravenous contrast.  Angiographic images of the intracranial venous structures  were obtained using MRV technique without intravenous contrast. CONTRAST:  84m MULTIHANCE GADOBENATE DIMEGLUMINE 529 MG/ML IV SOLN COMPARISON:  CTA head and neck 04/12/2016 and earlier head CTs. Cervical spine MRI 09/09/2006. FINDINGS: MRI HEAD: The patient is intubated. There is some fluid layering in the pharynx. Major intracranial vascular flow voids are preserved except for the superior sagittal sinus, see below. The distal right vertebral artery appears dominant. No parenchymal diffusion restriction to suggest acute infarct, however, there are abnormal extra-axial cystic spaces at the vertex (series 6, image 23) which appear septated and mostly CSF isointense but are intermittently restricted on diffusion (series 4, image 43 and series 7, image 11). Furthermore there is diffuse abnormal increased FLAIR signal in the subarachnoid spaces diffusely (series 10, image 20) beyond that expected for artifact due to hyper oxygenation. There also appears to be trace layering debris in both occipital horns (series 10, image 11 and series 6, image 11). Fluid at the basilar cisterns appears more normal. There is no definite superimposed gyral edema, including no convincing temporal lobe or insula edema. Following contrast there is mild diffuse increased leptomeningeal enhancement throughout the brain. No pachymeningeal thickening. No abnormal parenchymal mass or parenchymal enhancement. The superior sagittal sinus at the vertex has an abnormally diminutive appearance although there does appear to be preserved major cortical vein enhancement (series 15 image 8). There also appears to be hypertrophied skull diploic space venous enhancement. No ventriculomegaly. No acute or chronic cerebral blood products identified. No encephalomalacia. Grossly negative pituitary. Cervicomedullary junction and visualized cervical spine appear within normal limits. Visualized bone  marrow signal is within normal limits. Negative orbits soft tissues. Visualized paranasal sinuses and mastoids are stable and well pneumatized. Visible internal auditory structures appear normal. MRV HEAD: MRV images demonstrate a segment of about 4.5 cm of the superior sagittal sinus with loss of flow signal. However, anterior and posterior to this segment flow is relatively preserved (series 902, image 2). Furthermore, there is an appearance of hypertrophied smaller dural veins in cortical veins about the abnormal superior sagittal sinus segment. There is preserved flow in the torcula, straight sinus, vein of Galen, internal cerebral veins and basal veins of Rosenthal. There is preserved flow signal in the bilateral transverse and sigmoid sinuses, the left are dominant. The above postcontrast images suggest preserved patency of the cavernous sinus. IMPRESSION: 1. Diffusely abnormal subarachnoid space, especially at the vertex where small loculated foci of complex fluid are noted. Trace layering debris in the lateral ventricles. Associated diffuse leptomeningeal enhancement. This constellation favors an Acute, Complicated Meningitis. 2. There is a 4.5 cm segment of the superior sagittal sinus which appears thrombosed, although there is superimposed hypertrophy of surrounding dural an cortical veins, as well as skull diploic veins. I strongly suspect this segment of thrombosis is secondary to acute infection (#1), rather than the primary insult, and there is no other venous sinus thrombosis. 3. No acute infarct, and no definite cerebral edema to indicate a progression to cerebritis. Electronically Signed: By: HGenevie AnnM.D. On: 04/13/2016 06:53   Ct Head Code Stroke W/o Cm  Result Date: 04/12/2016 CLINICAL DATA:  Code stroke. Altered mental status, LEFT-sided weakness last seen normal at 0030 hours. Possible drug overdose. History of hypertension. EXAM: CT HEAD WITHOUT CONTRAST TECHNIQUE: Contiguous axial images  were obtained from the base of the skull through the vertex without intravenous contrast. COMPARISON:  CT HEAD March 31, 2016 FINDINGS: Mild motion degraded examination. BRAIN: The ventricles and sulci are normal. No intraparenchymal  hemorrhage, mass effect nor midline shift. No acute large vascular territory infarcts. No abnormal extra-axial fluid collections. Basal cisterns are patent. VASCULAR: Unremarkable. SKULL/SOFT TISSUES: No skull fracture. No significant soft tissue swelling. ORBITS/SINUSES: The included ocular globes and orbital contents are normal.The mastoid aircells and included paranasal sinuses are well-aerated. OTHER: None. ASPECTS [facility] Stroke Program Early CT Score) - Ganglionic level infarction (caudate, lentiform nuclei, internal capsule, insula, M1-M3 cortex): 7 - Supraganglionic infarction (M4-M6 cortex): 3 Total score (0-10 with 10 being normal): 10 IMPRESSION: 1. No acute intracranial process; negative CT HEAD for age. 2. ASPECTS is 10. Critical Value/emergent results were called by telephone at the time of interpretation on 04/12/2016 at 3:53 am to Dr. Otelia Limes, who verbally acknowledged these results. Electronically Signed   By: Awilda Metro M.D.   On: 04/12/2016 03:56   Recent Results (from the past 240 hour(s))  Blood Culture (routine x 2)     Status: None (Preliminary result)   Collection Time: 04/12/16  4:05 AM  Result Value Ref Range Status   Specimen Description BLOOD RIGHT HAND  Final   Special Requests IN PEDIATRIC BOTTLE  Final   Culture  Setup Time   Final    GRAM POSITIVE COCCI IN PAIRS IN PEDIATRIC BOTTLE CRITICAL RESULT CALLED TO, READ BACK BY AND VERIFIED WITH: M MACCIA,PHARMD AT 1610 04/12/16 BY L BENFIELD    Culture   Final    GRAM POSITIVE COCCI CULTURE REINCUBATED FOR BETTER GROWTH    Report Status PENDING  Incomplete  Blood Culture (routine x 2)     Status: None (Preliminary result)   Collection Time: 04/12/16  4:15 AM  Result Value Ref Range  Status   Specimen Description BLOOD LEFT FOREARM  Final   Special Requests BOTTLES DRAWN AEROBIC ONLY  Final   Culture  Setup Time   Final    GRAM POSITIVE COCCI IN CHAINS AEROBIC BOTTLE ONLY CRITICAL RESULT CALLED TO, READ BACK BY AND VERIFIED WITH: M MACCIA,PHARMD AT 1610 04/12/16 BY L BENFIELD    Culture   Final    GRAM POSITIVE COCCI CULTURE REINCUBATED FOR BETTER GROWTH    Report Status PENDING  Incomplete  Blood Culture ID Panel (Reflexed)     Status: Abnormal   Collection Time: 04/12/16  4:15 AM  Result Value Ref Range Status   Enterococcus species NOT DETECTED NOT DETECTED Final   Listeria monocytogenes NOT DETECTED NOT DETECTED Final   Staphylococcus species NOT DETECTED NOT DETECTED Final   Staphylococcus aureus NOT DETECTED NOT DETECTED Final   Streptococcus species DETECTED (A) NOT DETECTED Final    Comment: CRITICAL RESULT CALLED TO, READ BACK BY AND VERIFIED WITH: M MACCIA,PHARMD AT 1610 04/12/16 BY L BENFIELD    Streptococcus agalactiae NOT DETECTED NOT DETECTED Final   Streptococcus pneumoniae DETECTED (A) NOT DETECTED Final    Comment: CRITICAL RESULT CALLED TO, READ BACK BY AND VERIFIED WITH: M MACCIA,PHARMD AT 1610 04/12/16 BY L BENFIELD    Streptococcus pyogenes NOT DETECTED NOT DETECTED Final   Acinetobacter baumannii NOT DETECTED NOT DETECTED Final   Enterobacteriaceae species NOT DETECTED NOT DETECTED Final   Enterobacter cloacae complex NOT DETECTED NOT DETECTED Final   Escherichia coli NOT DETECTED NOT DETECTED Final   Klebsiella oxytoca NOT DETECTED NOT DETECTED Final   Klebsiella pneumoniae NOT DETECTED NOT DETECTED Final   Proteus species NOT DETECTED NOT DETECTED Final   Serratia marcescens NOT DETECTED NOT DETECTED Final   Haemophilus influenzae NOT DETECTED NOT DETECTED Final  Neisseria meningitidis NOT DETECTED NOT DETECTED Final   Pseudomonas aeruginosa NOT DETECTED NOT DETECTED Final   Candida albicans NOT DETECTED NOT DETECTED Final    Candida glabrata NOT DETECTED NOT DETECTED Final   Candida krusei NOT DETECTED NOT DETECTED Final   Candida parapsilosis NOT DETECTED NOT DETECTED Final   Candida tropicalis NOT DETECTED NOT DETECTED Final  Urine culture     Status: None   Collection Time: 04/12/16  4:56 AM  Result Value Ref Range Status   Specimen Description URINE, RANDOM  Final   Special Requests NONE  Final   Culture NO GROWTH  Final   Report Status 04/13/2016 FINAL  Final  MRSA PCR Screening     Status: None   Collection Time: 04/12/16  6:51 PM  Result Value Ref Range Status   MRSA by PCR NEGATIVE NEGATIVE Final    Comment:        The GeneXpert MRSA Assay (FDA approved for NASAL specimens only), is one component of a comprehensive MRSA colonization surveillance program. It is not intended to diagnose MRSA infection nor to guide or monitor treatment for MRSA infections.       04/13/2016, 11:01 AM     LOS: 1 day    Records and images were personally reviewed where available.

## 2016-04-13 NOTE — Progress Notes (Signed)
OT Cancellation Note  Patient Details Name: [name] MRN: [id number] DOB: 01-10-1951   Cancelled Treatment:    Reason Eval/Treat Not Completed: Patient not medically ready (strict bedrest orders). Please update activity orders as appropriate. Will follow up for OT eval as time allows.  Binnie Kand M.S., OTR/L Pager: [phone]  04/13/2016, 3:31 PM

## 2016-04-13 NOTE — Progress Notes (Signed)
eLink Physician-Brief Progress Note Patient Name: [name] DOB: 28-May-1950 MRN: [id number]   Date of Service  04/13/2016  HPI/Events of Note  Request to renew IV fluid order.  eICU Interventions  Will renew 0.9 NaCl to run IV at 75 mL/hour.      Intervention Category Intermediate Interventions: Other:  [name] 04/13/2016, 8:13 PM

## 2016-04-13 NOTE — Progress Notes (Addendum)
Initial Nutrition Assessment  DOCUMENTATION CODES:   Not applicable  INTERVENTION:  If unable to extubate, Recommend Initiate TF with Vital AF 1.2 formula at goal rate of 40 ml/h (960 ml per day) and Prostat 30 ml once daily. TF with currently propofol rate will provide 1434 kcals, 87 gm protein, 778 ml free water daily.  NUTRITION DIAGNOSIS:   Inadequate oral intake related to inability to eat as evidenced by NPO status.  GOAL:   Patient will meet greater than or equal to 90% of their needs  MONITOR:   Vent status, Labs, Weight trends, Skin, I & O's  REASON FOR ASSESSMENT:   Ventilator    ASSESSMENT:   66 y.o. female admitted 02/02 with acute encephalopathy and concern for acute CVA.  She received tPA at 0435 but had no improvement afterwards.  Intubated 2/2 for airway protection. MRI and blood cx's show S pneumo meningitis   Patient is currently intubated on ventilator support MV: 7.9 L/min Temp (24hrs), Avg:99.5 F (37.5 C), Min:98.5 F (36.9 C), Max:100.4 F (38 C)  Propofol: 6.9 ml/hr which provides 182 kcal/day.  No family at bedside. Pt with no weight loss per weight records. Pt with no observed significant fat or muscle mass loss.  Labs and medications reviewed. Magnesium low at 1.6.  Diet Order:  Diet NPO time specified  Skin:  Reviewed, no issues  Last BM:  Unknown  Height:   Ht Readings from Last 1 Encounters:  04/12/16 5\' 4"  (1.626 m)    Weight:   Wt Readings from Last 1 Encounters:  04/12/16 127 lb (57.6 kg)    Ideal Body Weight:  54.5 kg  BMI:  Body mass index is 21.8 kg/m.  Estimated Nutritional Needs:   Kcal:  [id number]  Protein:  85-95 grams  Fluid:  Per MD  EDUCATION NEEDS:   No education needs identified at this time  Corrin Parker, MS, RD, LDN Pager # [phone] After hours/ weekend pager # [phone]

## 2016-04-13 NOTE — Progress Notes (Signed)
EEG Completed; Results Pending  

## 2016-04-13 NOTE — Procedures (Signed)
History: 66 yo F with meningitis, AMS  Sedation: Propofol  Technique: This is a 21 channel routine scalp EEG performed at the bedside with bipolar and monopolar montages arranged in accordance to the international 10/20 system of electrode placement. One channel was dedicated to EKG recording.    Background: The background is asymmetric with faster activities much more prominent on the left than right. There is also irregular  delta activity with a clear right-sided predominance. There is also a discontinuity to the background with brief periods of generalized attenuation superimposed with brief runs of beta resembled sleep spindles. Posterior dominant rhythm does achieve a frequency of 8 Hz and in addition there is anteriorly predominant beta activity which appears attenuated on the right. There are occasional left temporal sharp waves seen at F7 > T7 with P7 sometimes being involved as well.  Photic stimulation: Physiologic driving is not performed  EEG Abnormalities: 1) left temporal sharp waves 2) right hemispheric slow activity 3) attenuation of faster frequencies in the right 4) generalized irregular slow activity  Clinical Interpretation: This EEG recorded evidence of a potential seizure focus in the LEFT temporal region as well as a focal cerebral dysfunction of the RIGHT hemisphere. This is in the setting of a generalized nonspecific cerebral dysfunction(encephalopathy).  There was no seizure recorded on this study.  Roland Rack, MD [facility] [phone]  If 7pm- 7am, please page neurology on call as listed in [place].

## 2016-04-13 NOTE — Progress Notes (Signed)
Sharp waves on EEG, started keppra 1g x 1, then 500mg  BID. No definite seizure was seen, but given clinical scenario and presence of sharp waves, feel treatment would be prudent.    Roland Rack, MD [facility] [phone]  If 7pm- 7am, please page neurology on call as listed in [place].

## 2016-04-13 NOTE — Progress Notes (Addendum)
Subjective: Continues to have left-sided weakness.  Exam: Vitals:   04/13/16 1700 04/13/16 1800  BP: 132/69 122/74  Pulse: (!) 103 (!) 104  Resp:    Temp:     Gen: In bed, NAD Resp: non-labored breathing, no acute distress Abd: soft, nt  Neuro: MS: She does not open eyes, does not follow commands. She does actively resist eye opening and try to push me away with her right arm CN: Pupils equal round and reactive to light, patient actively resists any type of movement to check doll's eyes, corneals are intact Motor: She moves her right side voluntarily and purposefully, withdraws left side to noxious stimuli Sensory: She responds more briskly on the right than left  Pertinent Labs: Blood cultures positive for strep pneumonia  Impression: 66 year old female with strep pneumonia meningitis and septic superior sagittal venous sinus thrombosis. The venous thrombosis is quite concerning given the severity of a superior sagittal sinus thrombosis. It appears on MRV that she may have some persistent flow through there. The role of anticoagulants is poorly defined in this situation, but given the high mortality if she completely occludes her sagittal sinus I do think it is  prudent to start heparin.  Also, no definite seizures but she does have sharp waves on EEG and this clinical setting I think she is a very high risk and will start Keppra.  Prognosis at this time I think is unclear.  Recommendations: 1) start heparin 2) Keppra 1 g 1 followed by 500 mg twice a day 3) continue antibiotics per infectious disease 4) agree with dexamethasone 5) neurology will continue to follow  This patient is critically ill and at significant risk of neurological worsening, death and care requires constant monitoring of vital signs, hemodynamics,respiratory and cardiac monitoring, neurological assessment, discussion with family, other specialists and medical decision making of high complexity. I spent 35  minutes of neurocritical care time  in the care of  this patient.  Roland Rack, MD [facility] [phone]  If 7pm- 7am, please page neurology on call as listed in [place]. 04/13/2016  6:45 PM

## 2016-04-13 NOTE — Progress Notes (Signed)
ANTICOAGULATION CONSULT NOTE - Initial Consult  Pharmacy Consult for heparin  Indication: Venous sinus thrombosis  Allergies  Allergen Reactions  . Codeine Nausea And Vomiting  . Nsaids Other (See Comments)    Stomach ulcers    Patient Measurements: Height: '5\' 4"'$  (162.6 cm) Weight: 127 lb (57.6 kg) IBW/kg (Calculated) : 54.7   Vital Signs: Temp: 98.9 F (37.2 C) (02/03 1601) Temp Source: Axillary (02/03 1601) BP: 145/78 (02/03 1601) Pulse Rate: 110 (02/03 1400)  Labs:  Recent Labs  04/12/16 0313 04/12/16 0326 04/12/16 0406 04/13/16 0234  HGB 13.1 13.9  --  9.4*  HCT 37.9 41.0  --  27.0*  PLT 505*  --   --  389  APTT  --   --  25  --   LABPROT  --   --  13.4  --   INR  --   --  1.02  --   CREATININE 1.13* 1.00  --  0.78    Estimated Creatinine Clearance: 61.3 mL/min (by C-G formula based on SCr of 0.78 mg/dL).   Medical History: Past Medical History:  Diagnosis Date  . Arrhythmia   . Arthritis   . Fibromyalgia   . GERD (gastroesophageal reflux disease)   . H/O: substance abuse 2005   narcotic usage due to chronic back pain  . Heart murmur   . Hypertension   . PONV (postoperative nausea and vomiting)     Medications:  Prescriptions Prior to Admission  Medication Sig Dispense Refill Last Dose  . acetaminophen (TYLENOL) 500 MG tablet Take 1,000 mg by mouth 3 (three) times daily as needed for moderate pain.    unknown  . diazepam (VALIUM) 5 MG tablet Take 2.5-5 mg by mouth 3 (three) times daily as needed for muscle spasms.   maybe 2/1  . HYDROcodone-acetaminophen (NORCO/VICODIN) 5-325 MG tablet Take 1 tablet by mouth 3 (three) times daily as needed (pain).   maybe 2/1  . lisinopril (PRINIVIL,ZESTRIL) 5 MG tablet Take 1 tablet (5 mg total) by mouth daily. 90 tablet 0 maybe 2/1  . meloxicam (MOBIC) 15 MG tablet Take 15 mg by mouth daily as needed for pain.   maybe 2/1  . methadone (DOLOPHINE) 10 MG/ML solution Take 90 mg by mouth daily.    maybe 2/1  .  chlorhexidine (PERIDEX) 0.12 % solution 15 mL swish and spit bid (Patient not taking: Reported on 03/31/2016) 480 mL 0 Not Taking at Unknown time  . cyclobenzaprine (FLEXERIL) 10 MG tablet Take 1 tablet (10 mg total) by mouth 2 (two) times daily as needed for muscle spasms. (Patient not taking: Reported on 04/12/2016) 20 tablet 0 Not Taking at Unknown time  . lidocaine (XYLOCAINE) 2 % solution Use as directed 10 mLs in the mouth or throat every 6 (six) hours as needed for mouth pain. Hold in mouth and spit. Do not swallow. (Patient not taking: Reported on 03/31/2016) 200 mL 0 Not Taking at Unknown time  . ondansetron (ZOFRAN ODT) 8 MG disintegrating tablet Take 1 tablet (8 mg total) by mouth every 8 (eight) hours as needed for nausea or vomiting. (Patient not taking: Reported on 03/31/2016) 20 tablet 0 Not Taking at Unknown time  . traMADol (ULTRAM) 50 MG tablet Take 1 tablet (50 mg total) by mouth every 8 (eight) hours as needed. (Patient not taking: Reported on 04/12/2016) 30 tablet 0 Not Taking at Unknown time   Scheduled:  .  stroke: mapping our early stages of recovery book   Does  not apply Once  . sodium chloride  50 mL Intravenous Once  . cefTRIAXone (ROCEPHIN)  IV  2 g Intravenous Q12H  . chlorhexidine gluconate (MEDLINE KIT)  15 mL Mouth Rinse BID  . dexamethasone  4 mg Intravenous Q6H  . [START ON 04/14/2016] levETIRAcetam  500 mg Intravenous Q12H  . LORazepam  2 mg Intravenous Once  . mouth rinse  15 mL Mouth Rinse 10 times per day  . pantoprazole (PROTONIX) IV  40 mg Intravenous QHS  . vancomycin  500 mg Intravenous Q12H    Assessment: 66 yo female with AMS with pneumococcal meningitis and bacteremia. She is noted with venous sinus thrombosis to begin heparin (no bolus and low goal per neuro)  Goal of Therapy:  Heparin level 0.3-0.5 units/ml Monitor platelets by anticoagulation protocol: Yes   Plan:  -No heparin bolus -begin heparin at 700 units/hr -Heparin level in 8 hours and daily  wth CBC daily  Hildred Laser, Pharm D 04/13/2016 5:58 PM

## 2016-04-14 DIAGNOSIS — G039 Meningitis, unspecified: Secondary | ICD-10-CM

## 2016-04-14 LAB — CBC
HCT: 24.3 % — ABNORMAL LOW (ref 36.0–46.0)
Hemoglobin: 8.4 g/dL — ABNORMAL LOW (ref 12.0–15.0)
MCH: 32.3 pg (ref 26.0–34.0)
MCHC: 34.6 g/dL (ref 30.0–36.0)
MCV: 93.5 fL (ref 78.0–100.0)
Platelets: 361 10*3/uL (ref 150–400)
RBC: 2.6 MIL/uL — ABNORMAL LOW (ref 3.87–5.11)
RDW: 13.9 % (ref 11.5–15.5)
WBC: 21.2 10*3/uL — ABNORMAL HIGH (ref 4.0–10.5)

## 2016-04-14 LAB — CULTURE, BLOOD (ROUTINE X 2)

## 2016-04-14 LAB — HEPARIN LEVEL (UNFRACTIONATED)
Heparin Unfractionated: <0.1 IU/mL — ABNORMAL LOW (ref 0.30–0.70)
Heparin Unfractionated: <0.1 IU/mL — ABNORMAL LOW (ref 0.30–0.70)
Heparin Unfractionated: 0.1 IU/mL — ABNORMAL LOW (ref 0.30–0.70)

## 2016-04-14 LAB — PROCALCITONIN: Procalcitonin: 53.58 ng/mL

## 2016-04-14 LAB — HEPATITIS PANEL, ACUTE
HCV Ab: 0.1 s/co ratio (ref 0.0–0.9)
Hep A IgM: NEGATIVE
Hep B C IgM: NEGATIVE
Hepatitis B Surface Ag: NEGATIVE

## 2016-04-14 LAB — HEMOGLOBIN A1C
Hgb A1c MFr Bld: 5.6 % (ref 4.8–5.6)
Mean Plasma Glucose: 114 mg/dL

## 2016-04-14 LAB — BASIC METABOLIC PANEL
Anion gap: 12 (ref 5–15)
BUN: 22 mg/dL — ABNORMAL HIGH (ref 6–20)
CO2: 21 mmol/L — ABNORMAL LOW (ref 22–32)
Calcium: 8.2 mg/dL — ABNORMAL LOW (ref 8.9–10.3)
Chloride: 108 mmol/L (ref 101–111)
Creatinine, Ser: 0.72 mg/dL (ref 0.44–1.00)
GFR calc Af Amer: >60 mL/min (ref >60)
GFR calc non Af Amer: >60 mL/min (ref >60)
Glucose, Bld: 107 mg/dL — ABNORMAL HIGH (ref 65–99)
Potassium: 4.2 mmol/L (ref 3.5–5.1)
Sodium: 141 mmol/L (ref 135–145)

## 2016-04-14 LAB — LACTIC ACID, PLASMA: Lactic Acid, Venous: 0.8 mmol/L (ref 0.5–1.9)

## 2016-04-14 LAB — PROTIME-INR
INR: 1.05
Prothrombin Time: 13.7 seconds (ref 11.4–15.2)

## 2016-04-14 LAB — MAGNESIUM: Magnesium: 1.8 mg/dL (ref 1.7–2.4)

## 2016-04-14 LAB — PHOSPHORUS: Phosphorus: 2.9 mg/dL (ref 2.5–4.6)

## 2016-04-14 LAB — HIV ANTIBODY (ROUTINE TESTING W REFLEX): HIV Screen 4th Generation wRfx: NONREACTIVE

## 2016-04-14 MED ORDER — SODIUM CHLORIDE 0.9 % IV SOLN
25.0000 ug/h | INTRAVENOUS | Status: DC
  Administered 2016-04-14: 100 ug/h via INTRAVENOUS
  Administered 2016-04-14: 300 ug/h via INTRAVENOUS
  Administered 2016-04-15: 250 ug/h via INTRAVENOUS
  Administered 2016-04-15 – 2016-04-16 (×3): 300 ug/h via INTRAVENOUS
  Administered 2016-04-17: 250 ug/h via INTRAVENOUS
  Filled 2016-04-14 (×8): qty 50

## 2016-04-14 MED ORDER — ORAL CARE MOUTH RINSE
15.0000 mL | Freq: Four times a day (QID) | OROMUCOSAL | Status: DC
  Administered 2016-04-15 – 2016-04-17 (×8): 15 mL via OROMUCOSAL

## 2016-04-14 MED ORDER — MIDAZOLAM HCL 2 MG/2ML IJ SOLN
2.0000 mg | INTRAMUSCULAR | Status: DC | PRN
  Administered 2016-04-14 – 2016-04-16 (×5): 2 mg via INTRAVENOUS
  Filled 2016-04-14 (×5): qty 2

## 2016-04-14 MED ORDER — DOCUSATE SODIUM 50 MG/5ML PO LIQD
100.0000 mg | Freq: Two times a day (BID) | ORAL | Status: DC | PRN
  Filled 2016-04-14: qty 10

## 2016-04-14 MED ORDER — VITAL AF 1.2 CAL PO LIQD
1000.0000 mL | ORAL | Status: DC
  Administered 2016-04-14 – 2016-04-16 (×3): 1000 mL

## 2016-04-14 MED ORDER — FENTANYL CITRATE (PF) 100 MCG/2ML IJ SOLN: 50.0000 ug | Freq: Once | INTRAMUSCULAR | Status: DC

## 2016-04-14 MED ORDER — MIDAZOLAM HCL 2 MG/2ML IJ SOLN
2.0000 mg | INTRAMUSCULAR | Status: AC | PRN
  Administered 2016-04-14 – 2016-04-16 (×3): 2 mg via INTRAVENOUS
  Filled 2016-04-14 (×5): qty 2

## 2016-04-14 MED ORDER — BISACODYL 10 MG RE SUPP: 10.0000 mg | Freq: Every day | RECTAL | Status: DC | PRN

## 2016-04-14 MED ORDER — FENTANYL BOLUS VIA INFUSION
50.0000 ug | INTRAVENOUS | Status: DC | PRN
  Filled 2016-04-14: qty 50

## 2016-04-14 NOTE — Progress Notes (Signed)
SLP Cancellation Note  Patient Details Name: [name] MRN: [id number] DOB: 04-04-1950   Cancelled treatment:       Reason Eval/Treat Not Completed: Patient not medically ready   Zoella Roberti, Katherene Ponto 04/14/2016, 7:24 AM

## 2016-04-14 NOTE — Progress Notes (Signed)
PULMONARY / CRITICAL CARE MEDICINE   Name: [name] MRN: [id number] DOB: [birth year]    ADMISSION DATE:  04/12/2016 CONSULTATION DATE:  04/12/16  REFERRING MD:  Cheral Marker  CHIEF COMPLAINT:  AMS  HISTORY OF PRESENT ILLNESS:  Pt is encephelopathic; therefore, this HPI is obtained from chart review. Kendricka Cardenas is a 66 y.o. female with PMH as outlined below. She presented to [facility] ED 02/02 with acute onset left sided weakness and dysphasia that started between midnight and 1am.  Per her husband, pt has chronic neck pain but this had gotten worse over the past few days.  They saw PCP who prescribed her a prednisone pack and flexeril (she is chronically on methadone and valium; though valium is not listed on outpatient med list).  Pt and husband then went to sleep just before midnight and husband then woke up around 1am and found her to have above symptoms.  En route to ED, she had projectile vomiting.  In ED, she had fever (Tmax 102.7), tachycardia with rates in 150's, altered mental status, dysphasia, left sided weakness.  EMS informed EDP that there was concern for prescription drug abuse since pt had bottles of hydrocodone recently filled 4 days ago but only had 2 pills left, as well as diazepam bottle that was empty.  CT head was obtained and was normal.  She was evaluated by neurology who felt that she was a tPA candidate, she received this at 0435.  She was also started on empiric coverage for meningitis.  She had no improvement after tPA;   SUBJECTIVE:   RN reports probable yeast vaginitis when pt's foley placed  VITAL SIGNS: BP (!) 154/87   Pulse 91   Temp 98.9 F (37.2 C) (Axillary)   Resp 16   Ht 5\' 4"  (1.626 m)   Wt 57.6 kg (127 lb)   SpO2 98%   BMI 21.80 kg/m   HEMODYNAMICS:    VENTILATOR SETTINGS: Vent Mode: PRVC FiO2 (%):  [30 %] 30 % Set Rate:  [16 bmp] 16 bmp Vt Set:  [500 mL] 500 mL PEEP:  [5 cmH20] 5 cmH20 Pressure Support:  [5 cmH20-10 cmH20] 10 cmH20 Plateau  Pressure:  [16 cmH20-18 cmH20] 17 cmH20  INTAKE / OUTPUT: I/O last 3 completed shifts: In: 3798.6 [I.V.:3188.6; IV Piggyback:610] Out: [id number] [Urine:1695]   PHYSICAL EXAMINATION: General: Intubated and sedated Neuro: opening eyes, moving her L UE better than 2/3, intermittently follows commands off propofol HEENT: ETT in place, no appreciable neck stiffness Cardiovascular: regular, no M Lungs: clear, no crackles or wheezes Abdomen: soft, benign Musculoskeletal: no deformities Skin: no rash  LABS:  BMET  Recent Labs Lab 04/12/16 0313 04/12/16 0326 04/13/16 0234 04/14/16 0309  NA 137 139 141 141  K 4.1 4.1 4.3 4.2  CL 98* 103 106 108  CO2 20*  --  26 21*  BUN 36* 39* 17 22*  CREATININE 1.13* 1.00 0.78 0.72  GLUCOSE 105* 112* 108* 107*    Electrolytes  Recent Labs Lab 04/12/16 0313 04/13/16 0234 04/14/16 0309  CALCIUM 9.3 7.8* 8.2*  MG  --  1.6* 1.8  PHOS  --  2.9 2.9    CBC  Recent Labs Lab 04/12/16 0313 04/12/16 0326 04/13/16 0234 04/14/16 0600  WBC 29.6*  --  39.4* 21.2*  HGB 13.1 13.9 9.4* 8.4*  HCT 37.9 41.0 27.0* 24.3*  PLT 505*  --  389 361    Coag's  Recent Labs Lab 04/12/16 0406 04/14/16 0309  APTT 25  --  INR 1.02 1.05    Sepsis Markers  Recent Labs Lab 04/12/16 0640  04/12/16 1016 04/13/16 0234 04/13/16 1232 04/14/16 0309 04/14/16 0600  LATICACIDVEN  --   < > 4.2*  --  0.9  --  0.8  PROCALCITON 27.30  --   --  74.56  --  53.58  --   < > = values in this interval not displayed.  ABG  Recent Labs Lab 04/12/16 1823  PHART 7.433  PCO2ART 43.2  PO2ART 483.0*    Liver Enzymes  Recent Labs Lab 04/12/16 0313  AST 40  ALT 36  ALKPHOS 224*  BILITOT 1.1  ALBUMIN 2.8*    Cardiac Enzymes No results for input(s): TROPONINI, PROBNP in the last 168 hours.  Glucose  Recent Labs Lab 04/13/16 1559  GLUCAP 111*    Imaging Dg Chest Port 1 View  Result Date: 04/13/2016 CLINICAL DATA:  Acute respiratory failure.  EXAM: PORTABLE CHEST 1 VIEW COMPARISON:  Chest x-ray from earlier same day FINDINGS: Endotracheal tube appears well positioned with tip just above the level of the carina. Enteric tube passes below the diaphragm. Heart size and mediastinal contours are normal. Lungs are clear. No pleural effusion or pneumothorax seen. Osseous structures about the chest are unremarkable. IMPRESSION: 1. Endotracheal tube well positioned with tip just above the level of the carina. 2. Enteric tube passes below the diaphragm. 3. Lungs are clear. Electronically Signed   By: Franki Cabot M.D.   On: 04/13/2016 17:34   Dg Abd Portable 1v  Result Date: 04/13/2016 CLINICAL DATA:  OG tube placement EXAM: PORTABLE ABDOMEN - 1 VIEW COMPARISON:  None. FINDINGS: OG tube appears appropriately positioned within the stomach. Paucity of bowel gas, grossly nonobstructive bowel gas pattern. No evidence of soft tissue mass or abnormal fluid collection. No evidence of free intraperitoneal air seen. Lung bases are clear. Visualized osseous structures are unremarkable. IMPRESSION: OG tube appears appropriately positioned in the stomach. Electronically Signed   By: Franki Cabot M.D.   On: 04/13/2016 17:34     STUDIES:  CT head 02/02 > negative. MRI brain 02/02 > diffuse subarachnoid leptomeningeal enhancement, trace layering debris, 4.5cm superior sagittal sinus thrombosis TTE 2/2 >> no vegetations, normal LV fxn, grade 1 diastolic dysfxn  CULTURES: Blood 02/02 > S pneumo Urine 02/02 >  ANTIBIOTICS: Vanc 02/02 > 2/3 Ceftriaxone 02/02 > Ampicillin 02/02 > 2/3 Acyclovir 02/02 > 2/3  SIGNIFICANT EVENTS: 02/02 > admit.  LINES/TUBES: ETT 2/3 >>   DISCUSSION: 66 y.o. female admitted 02/02 with acute encephalopathy and concern for acute CVA.  She received tPA at 0435 but had no improvement afterwards.  Intubated 2/2 for airway protection. MRI and blood cx's show S pneumo meningitis   ASSESSMENT / PLAN:  NEUROLOGIC A:   Acute  encephalopathy Acute S pneumo meningitis / encephalitis Sagittal sinus thrombosis Hx chronic back pain (with subsequent narcotic abuse), fibromyalgia, arthritis. P:   t-PA was given for suspected CVA on presentation Appreciate ID assistance abx as below Sedation goal RASS -1, propofol Will defer LP as dx appears to be made Will call for TEE on Monday Reviewed case with Dr Leonel Ramsay 2/4 >> heparin gtt for the sagittal sinus clot   INFECTIOUS A:   Sepsis due to pneumococcal meningitis and bacteremia P:   Ceftriaxone and dexamethasone Follow cx data Defer LP  CARDIOVASCULAR A:  Hypertensive urgency. Hx HTN, murmur. P:  Continue current sedation for BP control Hold her home carvedilol and lisinopril   PULMONARY A:  Acute respiratory failure due to poor airway protection / encephalopathy Tobacco dependence. P:   PRVC 8cc/kg Daily SBT, PSV ok but not suitable for extubation yet  RENAL A:   Lactic acidosis, cleared P:   Decrease NS to 30cc/h Follow BMP  GASTROINTESTINAL A:   Nausea with vomiting. Nutrition. Hx GERD. P:   NPO Start TF's 2/4  HEMATOLOGIC A:   S/p tPA at [id number] on 04/12/16. sagittal sinus thrombosis due to meningitis P:  OK to start heparin gtt Follow CBC  ENDOCRINE A:   No acute issues. P:   Follow glucose on steroids.   Family updated: Husband updated at bedside on 2/2, none present 2/4  Interdisciplinary Family Meeting v Palliative Care Meeting:  Due by: 04/19/16.  Independent CC time 35 minutes  Baltazar Apo, MD, PhD 04/14/2016, 8:02 AM [facility] [phone] or if no answer [phone]

## 2016-04-14 NOTE — Progress Notes (Signed)
[place] for heparin  Indication: Venous sinus thrombosis  Allergies  Allergen Reactions  . Codeine Nausea And Vomiting  . Nsaids Other (See Comments)    Stomach ulcers    Patient Measurements: Height: 5\' 4"  (162.6 cm) Weight: 127 lb (57.6 kg) IBW/kg (Calculated) : 54.7   Vital Signs: Temp: 98.5 F (36.9 C) (02/04 0000) Temp Source: Axillary (02/04 0000) BP: 144/73 (02/04 0300) Pulse Rate: 91 (02/04 0300)  Labs:  Recent Labs  04/12/16 0313 04/12/16 0326 04/12/16 0406 04/13/16 0234 04/14/16 0309  HGB 13.1 13.9  --  9.4*  --   HCT 37.9 41.0  --  27.0*  --   PLT 505*  --   --  389  --   APTT  --   --  25  --   --   LABPROT  --   --  13.4  --  13.7  INR  --   --  1.02  --  1.05  HEPARINUNFRC  --   --   --   --  <0.10*  CREATININE 1.13* 1.00  --  0.78 0.72    Estimated Creatinine Clearance: 61.3 mL/min (by C-G formula based on SCr of 0.72 mg/dL).  Assessment: 66 yo female with venous sinus thrombosis for heparin.    Goal of Therapy:  Heparin level 0.3-0.5 units/ml Monitor platelets by anticoagulation protocol: Yes   Plan:  Increase Heparin 900 units/hr Check heparin level in 8 hours.  Phillis Knack, PharmD, BCPS   04/14/2016 4:28 AM

## 2016-04-14 NOTE — Progress Notes (Addendum)
Subjective: Slight improvement overnight.   Exam: Vitals:   04/14/16 0700 04/14/16 0800  BP: (!) 154/87 (!) 152/88  Pulse: 91 (!) 105  Resp: 16 16  Temp:  98.8 F (37.1 C)   Gen: In bed, intubated Resp: ventilated Abd: soft, nt  Neuro: MS: Does not open eyes or follow commands, but clearly tries to push me away while examining her.  [id number], keeps eyes tightly shut and they have to be held open to examine(equal strength bilaterally), unable to check dolls eye due to eye/closure/patient resistance.  Motor: Left hemiparesis, but improved from yesterday, lifting left arm out of bed and spontaneously moving left side some.  Sensory:responds to nox stim x 4.    Impression: 66 year old female with strep pneumonia meningitis and septic superior sagittal venous sinus thrombosis. The venous thrombosis is quite concerning given the severity of a superior sagittal sinus thrombosis. It appears on MRV that she may have some small amount of persistent flow through there. The role of anticoagulants is poorly defined in this situation, but given the high mortality if she completely occludes her sagittal sinus I do think it is prudent to start heparin.  Also, no definite seizures but she does have sharp waves on EEG and this clinical setting I think she is a very high risk and will continue keppra.   With positive blood cultures and clear appearance of meningitis on MRI, I don't think that LP would give [place] much more information at this time.   Recommendations: 1) Continue keppra 500mg  BID 2) Continue heparin 3) continue antibiotics per ID 4) Will continue to follow.   This patient is critically ill and at significant risk of neurological worsening, death and care requires constant monitoring of vital signs, hemodynamics,respiratory and cardiac monitoring, neurological assessment, discussion with family, other specialists and medical decision making of high complexity. I spent 35 minutes of  neurocritical care time  in the care of  this patient.  Roland Rack, MD [facility] [phone]  If 7pm- 7am, please page neurology on call as listed in [place]. 04/14/2016  8:21 AM

## 2016-04-14 NOTE — Progress Notes (Signed)
[place] for heparin  Indication: Venous sinus thrombosis  Allergies  Allergen Reactions  . Codeine Nausea And Vomiting  . Nsaids Other (See Comments)    Stomach ulcers    Patient Measurements: Height: 5\' 4"  (162.6 cm) Weight: 127 lb (57.6 kg) IBW/kg (Calculated) : 54.7   Vital Signs: Temp: 99.2 F (37.3 C) (02/04 1147) Temp Source: Axillary (02/04 1147) BP: 146/76 (02/04 1158) Pulse Rate: 111 (02/04 1158)  Labs:  Recent Labs  04/12/16 0313 04/12/16 0326 04/12/16 0406 04/13/16 0234 04/14/16 0309 04/14/16 0600 04/14/16 1307  HGB 13.1 13.9  --  9.4*  --  8.4*  --   HCT 37.9 41.0  --  27.0*  --  24.3*  --   PLT 505*  --   --  389  --  361  --   APTT  --   --  25  --   --   --   --   LABPROT  --   --  13.4  --  13.7  --   --   INR  --   --  1.02  --  1.05  --   --   HEPARINUNFRC  --   --   --   --  <0.10*  --  <0.10*  CREATININE 1.13* 1.00  --  0.78 0.72  --   --     Estimated Creatinine Clearance: 61.3 mL/min (by C-G formula based on SCr of 0.72 mg/dL).  Assessment: 77 YOF who continues on heparin for venous sinus thrombosis. Heparin level remains undetectable despite a rate increase this morning. Drop in Hgb/Hct noted but thought to be dilutional - no overt s/sx of bleeding noted.     Goal of Therapy:  Heparin level 0.3-0.5 units/ml Monitor platelets by anticoagulation protocol: Yes   Plan:  1. Increase Heparin to 1100 units/hr (11 ml/hr) 2. Will continue to monitor for any signs/symptoms of bleeding and will follow up with heparin level in 6 hours   Thank you for allowing pharmacy to be a part of this patient's care.  Alycia Rossetti, PharmD, BCPS Clinical Pharmacist Pager: [phone] Clinical phone for 04/14/2016 from 7a-3:30p: [phone] If after 3:30p, please call main pharmacy at: [phone] 04/14/2016 2:48 PM

## 2016-04-14 NOTE — Progress Notes (Signed)
OT Cancellation Note  Patient Details Name: Analya Salvador MRN: [id number] DOB: 1950/12/07   Cancelled Treatment:    Reason Eval/Treat Not Completed: Patient not medically ready. OT to sign off at this time. Please re-order when appropriate.   Binnie Kand M.S., OTR/L Pager: [phone]  04/14/2016, 8:50 AM

## 2016-04-14 NOTE — Progress Notes (Addendum)
INFECTIOUS DISEASE PROGRESS NOTE  ID: [name] is a 66 y.o. female with  Active Problems:   Cerebral embolism with cerebral infarction   Stroke (cerebrum) (HCC)   Meningitis  Subjective: No response  Abtx:  Anti-infectives    Start     Dose/Rate Route Frequency Ordered Stop   04/12/16 1800  cefTRIAXone (ROCEPHIN) 2 g in dextrose 5 % 50 mL IVPB     2 g 100 mL/hr over 30 Minutes Intravenous Every 12 hours 04/12/16 0514     04/12/16 1700  acyclovir (ZOVIRAX) 550 mg in dextrose 5 % 100 mL IVPB  Status:  Discontinued     550 mg 111 mL/hr over 60 Minutes Intravenous Every 12 hours 04/12/16 0514 04/13/16 1126   04/12/16 1600  vancomycin (VANCOCIN) 500 mg in sodium chloride 0.9 % 100 mL IVPB     500 mg 100 mL/hr over 60 Minutes Intravenous Every 12 hours 04/12/16 0514     04/12/16 0800  ampicillin (OMNIPEN) 2 g in sodium chloride 0.9 % 50 mL IVPB  Status:  Discontinued     2 g 150 mL/hr over 20 Minutes Intravenous Every 4 hours 04/12/16 0514 04/13/16 1126   04/12/16 0515  acyclovir (ZOVIRAX) 550 mg in dextrose 5 % 100 mL IVPB     550 mg 111 mL/hr over 60 Minutes Intravenous  Once 04/12/16 0514 04/12/16 0800   04/12/16 0400  cefTRIAXone (ROCEPHIN) 2 g in dextrose 5 % 50 mL IVPB     2 g 100 mL/hr over 30 Minutes Intravenous  Once 04/12/16 0357 04/12/16 0658   04/12/16 0400  vancomycin (VANCOCIN) IVPB 1000 mg/200 mL premix     1,000 mg 200 mL/hr over 60 Minutes Intravenous  Once 04/12/16 0357 04/12/16 0536   04/12/16 0400  ampicillin (OMNIPEN) 2 g in sodium chloride 0.9 % 50 mL IVPB     2 g 150 mL/hr over 20 Minutes Intravenous  Once 04/12/16 0357 04/12/16 8828      Medications:  Scheduled: .  stroke: mapping our early stages of recovery book   Does not apply Once  . sodium chloride  50 mL Intravenous Once  . cefTRIAXone (ROCEPHIN)  IV  2 g Intravenous Q12H  . chlorhexidine gluconate (MEDLINE KIT)  15 mL Mouth Rinse BID  . dexamethasone  4 mg Intravenous Q6H  . fentaNYL  (SUBLIMAZE) injection  50 mcg Intravenous Once  . levETIRAcetam  500 mg Intravenous Q12H  . LORazepam  2 mg Intravenous Once  . mouth rinse  15 mL Mouth Rinse 10 times per day  . pantoprazole (PROTONIX) IV  40 mg Intravenous QHS  . vancomycin  500 mg Intravenous Q12H    Objective: Vital signs in last 24 hours: Temp:  [98.3 F (36.8 C)-99.4 F (37.4 C)] 99.2 F (37.3 C) (02/04 1147) Pulse Rate:  [85-117] 85 (02/04 1100) Resp:  [16-21] 16 (02/04 1100) BP: (122-168)/(63-97) 147/81 (02/04 1100) SpO2:  [97 %-100 %] 100 % (02/04 1100) FiO2 (%):  [30 %] 30 % (02/04 0856)   General appearance: does not open eyes, resists movement of neck.  Resp: clear to auscultation bilaterally Cardio: regular rate and rhythm GI: normal findings: soft, non-tender and abnormal findings:  hypoactive bowel sounds  Lab Results  Recent Labs  04/13/16 0234 04/14/16 0309 04/14/16 0600  WBC 39.4*  --  21.2*  HGB 9.4*  --  8.4*  HCT 27.0*  --  24.3*  NA 141 141  --   K 4.3 4.2  --  CL 106 108  --   CO2 26 21*  --   BUN 17 22*  --   CREATININE 0.78 0.72  --    Liver Panel  Recent Labs  04/12/16 0313  PROT 7.3  ALBUMIN 2.8*  AST 40  ALT 36  ALKPHOS 224*  BILITOT 1.1   Sedimentation Rate No results for input(s): ESRSEDRATE in the last 72 hours. C-Reactive Protein No results for input(s): CRP in the last 72 hours.  Microbiology: Recent Results (from the past 240 hour(s))  Blood Culture (routine x 2)     Status: Abnormal (Preliminary result)   Collection Time: 04/12/16  4:05 AM  Result Value Ref Range Status   Specimen Description BLOOD RIGHT HAND  Final   Special Requests IN PEDIATRIC BOTTLE 3ML  Final   Culture  Setup Time   Final    GRAM POSITIVE COCCI IN PAIRS IN PEDIATRIC BOTTLE CRITICAL RESULT CALLED TO, READ BACK BY AND VERIFIED WITH: M MACCIA,PHARMD AT 1610 04/12/16 BY L BENFIELD    Culture STREPTOCOCCUS PNEUMONIAE (A)  Final   Report Status PENDING  Incomplete  Blood  Culture (routine x 2)     Status: Abnormal (Preliminary result)   Collection Time: 04/12/16  4:15 AM  Result Value Ref Range Status   Specimen Description BLOOD LEFT FOREARM  Final   Special Requests BOTTLES DRAWN AEROBIC ONLY 5ML  Final   Culture  Setup Time   Final    GRAM POSITIVE COCCI IN CHAINS AEROBIC BOTTLE ONLY CRITICAL RESULT CALLED TO, READ BACK BY AND VERIFIED WITH: M Allen AT 1610 04/12/16 BY L BENFIELD    Culture (A)  Final    STREPTOCOCCUS PNEUMONIAE SUSCEPTIBILITIES TO FOLLOW    Report Status PENDING  Incomplete  Blood Culture ID Panel (Reflexed)     Status: Abnormal   Collection Time: 04/12/16  4:15 AM  Result Value Ref Range Status   Enterococcus species NOT DETECTED NOT DETECTED Final   Listeria monocytogenes NOT DETECTED NOT DETECTED Final   Staphylococcus species NOT DETECTED NOT DETECTED Final   Staphylococcus aureus NOT DETECTED NOT DETECTED Final   Streptococcus species DETECTED (A) NOT DETECTED Final    Comment: CRITICAL RESULT CALLED TO, READ BACK BY AND VERIFIED WITH: M MACCIA,PHARMD AT 1610 04/12/16 BY L BENFIELD    Streptococcus agalactiae NOT DETECTED NOT DETECTED Final   Streptococcus pneumoniae DETECTED (A) NOT DETECTED Final    Comment: CRITICAL RESULT CALLED TO, READ BACK BY AND VERIFIED WITH: M MACCIA,PHARMD AT 1610 04/12/16 BY L BENFIELD    Streptococcus pyogenes NOT DETECTED NOT DETECTED Final   Acinetobacter baumannii NOT DETECTED NOT DETECTED Final   Enterobacteriaceae species NOT DETECTED NOT DETECTED Final   Enterobacter cloacae complex NOT DETECTED NOT DETECTED Final   Escherichia coli NOT DETECTED NOT DETECTED Final   Klebsiella oxytoca NOT DETECTED NOT DETECTED Final   Klebsiella pneumoniae NOT DETECTED NOT DETECTED Final   Proteus species NOT DETECTED NOT DETECTED Final   Serratia marcescens NOT DETECTED NOT DETECTED Final   Haemophilus influenzae NOT DETECTED NOT DETECTED Final   Neisseria meningitidis NOT DETECTED NOT DETECTED  Final   Pseudomonas aeruginosa NOT DETECTED NOT DETECTED Final   Candida albicans NOT DETECTED NOT DETECTED Final   Candida glabrata NOT DETECTED NOT DETECTED Final   Candida krusei NOT DETECTED NOT DETECTED Final   Candida parapsilosis NOT DETECTED NOT DETECTED Final   Candida tropicalis NOT DETECTED NOT DETECTED Final  Urine culture     Status: None  Collection Time: 04/12/16  4:56 AM  Result Value Ref Range Status   Specimen Description URINE, RANDOM  Final   Special Requests NONE  Final   Culture NO GROWTH  Final   Report Status 04/13/2016 FINAL  Final  MRSA PCR Screening     Status: None   Collection Time: 04/12/16  6:51 PM  Result Value Ref Range Status   MRSA by PCR NEGATIVE NEGATIVE Final    Comment:        The GeneXpert MRSA Assay (FDA approved for NASAL specimens only), is one component of a comprehensive MRSA colonization surveillance program. It is not intended to diagnose MRSA infection nor to guide or monitor treatment for MRSA infections.     Studies/Results: Mr Jeri Cos KZ Contrast  Addendum Date: 04/13/2016   ADDENDUM REPORT: 04/13/2016 07:09 ADDENDUM: Study discussed by telephone with Dr. Rosalin Hawking on 04/13/2016 at 0701 hours. On review of the EMR I note that both of the patient's blood cultures yesterday at 0415 hours were positive for gram positive cocci in chains. Electronically Signed   By: Genevie Ann M.D.   On: 04/13/2016 07:09   Result Date: 04/13/2016 CLINICAL DATA:  66 year old female with unexplained acute onset left hemiplegia, hypertensive (systolic 601) and febrile (102.7) on EMS arrival. Status post IV tPA on 04/12/2016, but CTA head and neck negative for emergent large vessel occlusion or severe stenosis. Query CNS infection or venous sinus thrombosis. Initial encounter. EXAM: MRI HEAD WITHOUT AND WITH CONTRAST MRV HEAD WITHOUT CONTRAST TECHNIQUE: Multiplanar, multiecho pulse sequences of the brain and surrounding structures were obtained without and with  intravenous contrast. Angiographic images of the intracranial venous structures were obtained using MRV technique without intravenous contrast. CONTRAST:  62m MULTIHANCE GADOBENATE DIMEGLUMINE 529 MG/ML IV SOLN COMPARISON:  CTA head and neck 04/12/2016 and earlier head CTs. Cervical spine MRI 09/09/2006. FINDINGS: MRI HEAD: The patient is intubated. There is some fluid layering in the pharynx. Major intracranial vascular flow voids are preserved except for the superior sagittal sinus, see below. The distal right vertebral artery appears dominant. No parenchymal diffusion restriction to suggest acute infarct, however, there are abnormal extra-axial cystic spaces at the vertex (series 6, image 23) which appear septated and mostly CSF isointense but are intermittently restricted on diffusion (series 4, image 43 and series 7, image 11). Furthermore there is diffuse abnormal increased FLAIR signal in the subarachnoid spaces diffusely (series 10, image 20) beyond that expected for artifact due to hyper oxygenation. There also appears to be trace layering debris in both occipital horns (series 10, image 11 and series 6, image 11). Fluid at the basilar cisterns appears more normal. There is no definite superimposed gyral edema, including no convincing temporal lobe or insula edema. Following contrast there is mild diffuse increased leptomeningeal enhancement throughout the brain. No pachymeningeal thickening. No abnormal parenchymal mass or parenchymal enhancement. The superior sagittal sinus at the vertex has an abnormally diminutive appearance although there does appear to be preserved major cortical vein enhancement (series 15 image 8). There also appears to be hypertrophied skull diploic space venous enhancement. No ventriculomegaly. No acute or chronic cerebral blood products identified. No encephalomalacia. Grossly negative pituitary. Cervicomedullary junction and visualized cervical spine appear within normal  limits. Visualized bone marrow signal is within normal limits. Negative orbits soft tissues. Visualized paranasal sinuses and mastoids are stable and well pneumatized. Visible internal auditory structures appear normal. MRV HEAD: MRV images demonstrate a segment of about 4.5 cm of the superior sagittal  sinus with loss of flow signal. However, anterior and posterior to this segment flow is relatively preserved (series 902, image 2). Furthermore, there is an appearance of hypertrophied smaller dural veins in cortical veins about the abnormal superior sagittal sinus segment. There is preserved flow in the torcula, straight sinus, vein of Galen, internal cerebral veins and basal veins of Rosenthal. There is preserved flow signal in the bilateral transverse and sigmoid sinuses, the left are dominant. The above postcontrast images suggest preserved patency of the cavernous sinus. IMPRESSION: 1. Diffusely abnormal subarachnoid space, especially at the vertex where small loculated foci of complex fluid are noted. Trace layering debris in the lateral ventricles. Associated diffuse leptomeningeal enhancement. This constellation favors an Acute, Complicated Meningitis. 2. There is a 4.5 cm segment of the superior sagittal sinus which appears thrombosed, although there is superimposed hypertrophy of surrounding dural an cortical veins, as well as skull diploic veins. I strongly suspect this segment of thrombosis is secondary to acute infection (#1), rather than the primary insult, and there is no other venous sinus thrombosis. 3. No acute infarct, and no definite cerebral edema to indicate a progression to cerebritis. Electronically Signed: By: Genevie Ann M.D. On: 04/13/2016 06:53   Dg Chest Port 1 View  Result Date: 04/13/2016 CLINICAL DATA:  Acute respiratory failure. EXAM: PORTABLE CHEST 1 VIEW COMPARISON:  Chest x-ray from earlier same day FINDINGS: Endotracheal tube appears well positioned with tip just above the level of  the carina. Enteric tube passes below the diaphragm. Heart size and mediastinal contours are normal. Lungs are clear. No pleural effusion or pneumothorax seen. Osseous structures about the chest are unremarkable. IMPRESSION: 1. Endotracheal tube well positioned with tip just above the level of the carina. 2. Enteric tube passes below the diaphragm. 3. Lungs are clear. Electronically Signed   By: Franki Cabot M.D.   On: 04/13/2016 17:34   Dg Chest Port 1 View  Result Date: 04/13/2016 CLINICAL DATA:  66 year old female with respiratory failure. Intubated. Smoker. Initial encounter. EXAM: PORTABLE CHEST 1 VIEW COMPARISON:  04/12/2016 and earlier. FINDINGS: Portable AP semi upright view at 0712 hours. Endotracheal tube tip in good position midway between the level the clavicles and carina. Normal cardiac size and mediastinal contours. Calcified aortic atherosclerosis. Allowing for portable technique the lungs are clear. No pneumothorax or pleural effusion. IMPRESSION: 1. Endotracheal tube in good position. 2.  No acute cardiopulmonary abnormality. 3.  Calcified aortic atherosclerosis. Electronically Signed   By: Genevie Ann M.D.   On: 04/13/2016 08:34   Dg Chest Portable 1 View  Result Date: 04/12/2016 CLINICAL DATA:  Hypoxia, post intubation, history hypertension, GERD EXAM: PORTABLE CHEST 1 VIEW COMPARISON:  Portable exam 1731 hours compared to 04/12/2016 at 0525 hours FINDINGS: Tip of endotracheal tube projects 2.8 cm above carina. Normal heart size, mediastinal contours, and pulmonary vascularity. Atherosclerotic calcification aorta. Lungs clear. No pleural effusion or pneumothorax. IMPRESSION: No acute abnormalities. Satisfactory endotracheal tube position. Electronically Signed   By: Lavonia Dana M.D.   On: 04/12/2016 17:43   Dg Abd Portable 1v  Result Date: 04/13/2016 CLINICAL DATA:  OG tube placement EXAM: PORTABLE ABDOMEN - 1 VIEW COMPARISON:  None. FINDINGS: OG tube appears appropriately positioned within  the stomach. Paucity of bowel gas, grossly nonobstructive bowel gas pattern. No evidence of soft tissue mass or abnormal fluid collection. No evidence of free intraperitoneal air seen. Lung bases are clear. Visualized osseous structures are unremarkable. IMPRESSION: OG tube appears appropriately positioned in the stomach. Electronically  Signed   By: Franki Cabot M.D.   On: 04/13/2016 17:34   Mr Mrv Miguel Dibble FY Cm  Addendum Date: 04/13/2016   ADDENDUM REPORT: 04/13/2016 07:09 ADDENDUM: Study discussed by telephone with Dr. Rosalin Hawking on 04/13/2016 at 0701 hours. On review of the EMR I note that both of the patient's blood cultures yesterday at 0415 hours were positive for gram positive cocci in chains. Electronically Signed   By: Genevie Ann M.D.   On: 04/13/2016 07:09   Result Date: 04/13/2016 CLINICAL DATA:  66 year old female with unexplained acute onset left hemiplegia, hypertensive (systolic 101) and febrile (102.7) on EMS arrival. Status post IV tPA on 04/12/2016, but CTA head and neck negative for emergent large vessel occlusion or severe stenosis. Query CNS infection or venous sinus thrombosis. Initial encounter. EXAM: MRI HEAD WITHOUT AND WITH CONTRAST MRV HEAD WITHOUT CONTRAST TECHNIQUE: Multiplanar, multiecho pulse sequences of the brain and surrounding structures were obtained without and with intravenous contrast. Angiographic images of the intracranial venous structures were obtained using MRV technique without intravenous contrast. CONTRAST:  60m MULTIHANCE GADOBENATE DIMEGLUMINE 529 MG/ML IV SOLN COMPARISON:  CTA head and neck 04/12/2016 and earlier head CTs. Cervical spine MRI 09/09/2006. FINDINGS: MRI HEAD: The patient is intubated. There is some fluid layering in the pharynx. Major intracranial vascular flow voids are preserved except for the superior sagittal sinus, see below. The distal right vertebral artery appears dominant. No parenchymal diffusion restriction to suggest acute infarct, however,  there are abnormal extra-axial cystic spaces at the vertex (series 6, image 23) which appear septated and mostly CSF isointense but are intermittently restricted on diffusion (series 4, image 43 and series 7, image 11). Furthermore there is diffuse abnormal increased FLAIR signal in the subarachnoid spaces diffusely (series 10, image 20) beyond that expected for artifact due to hyper oxygenation. There also appears to be trace layering debris in both occipital horns (series 10, image 11 and series 6, image 11). Fluid at the basilar cisterns appears more normal. There is no definite superimposed gyral edema, including no convincing temporal lobe or insula edema. Following contrast there is mild diffuse increased leptomeningeal enhancement throughout the brain. No pachymeningeal thickening. No abnormal parenchymal mass or parenchymal enhancement. The superior sagittal sinus at the vertex has an abnormally diminutive appearance although there does appear to be preserved major cortical vein enhancement (series 15 image 8). There also appears to be hypertrophied skull diploic space venous enhancement. No ventriculomegaly. No acute or chronic cerebral blood products identified. No encephalomalacia. Grossly negative pituitary. Cervicomedullary junction and visualized cervical spine appear within normal limits. Visualized bone marrow signal is within normal limits. Negative orbits soft tissues. Visualized paranasal sinuses and mastoids are stable and well pneumatized. Visible internal auditory structures appear normal. MRV HEAD: MRV images demonstrate a segment of about 4.5 cm of the superior sagittal sinus with loss of flow signal. However, anterior and posterior to this segment flow is relatively preserved (series 902, image 2). Furthermore, there is an appearance of hypertrophied smaller dural veins in cortical veins about the abnormal superior sagittal sinus segment. There is preserved flow in the torcula, straight  sinus, vein of Galen, internal cerebral veins and basal veins of Rosenthal. There is preserved flow signal in the bilateral transverse and sigmoid sinuses, the left are dominant. The above postcontrast images suggest preserved patency of the cavernous sinus. IMPRESSION: 1. Diffusely abnormal subarachnoid space, especially at the vertex where small loculated foci of complex fluid are noted. Trace layering debris in  the lateral ventricles. Associated diffuse leptomeningeal enhancement. This constellation favors an Acute, Complicated Meningitis. 2. There is a 4.5 cm segment of the superior sagittal sinus which appears thrombosed, although there is superimposed hypertrophy of surrounding dural an cortical veins, as well as skull diploic veins. I strongly suspect this segment of thrombosis is secondary to acute infection (#1), rather than the primary insult, and there is no other venous sinus thrombosis. 3. No acute infarct, and no definite cerebral edema to indicate a progression to cerebritis. Electronically Signed: By: Genevie Ann M.D. On: 04/13/2016 06:53     Assessment/Plan: Meningitis, encephalitis L hemiplegia Substance abuse Streptococcal pneumonia bacteremia Sagital Sinus thrombosis  Total days of antibiotics: ceftriaxone, vanco Decadron Heparin  Will stop vanco Continue high dose ceftriaxone Needs TEE Agree that a LP would not change her care.  HIV, hepatitis studies negative Appreciate Dr Cecil Cobbs care         Bobby Rumpf Infectious Diseases (pager) [phone] [email or web address] 04/14/2016, 11:50 AM  LOS: 2 days

## 2016-04-14 NOTE — Progress Notes (Signed)
[place] for heparin  Indication: Venous sinus thrombosis  Allergies  Allergen Reactions  . Codeine Nausea And Vomiting  . Nsaids Other (See Comments)    Stomach ulcers    Patient Measurements: Height: 5\' 4"  (162.6 cm) Weight: 127 lb (57.6 kg) IBW/kg (Calculated) : 54.7   Vital Signs: Temp: 97.3 F (36.3 C) (02/04 2000) Temp Source: Axillary (02/04 2000) BP: 133/76 (02/04 2200) Pulse Rate: 88 (02/04 2200)  Labs:  Recent Labs  04/12/16 0313 04/12/16 0326 04/12/16 0406 04/13/16 0234 04/14/16 0309 04/14/16 0600 04/14/16 1307 04/14/16 2115  HGB 13.1 13.9  --  9.4*  --  8.4*  --   --   HCT 37.9 41.0  --  27.0*  --  24.3*  --   --   PLT 505*  --   --  389  --  361  --   --   APTT  --   --  25  --   --   --   --   --   LABPROT  --   --  13.4  --  13.7  --   --   --   INR  --   --  1.02  --  1.05  --   --   --   HEPARINUNFRC  --   --   --   --  <0.10*  --  <0.10* 0.10*  CREATININE 1.13* 1.00  --  0.78 0.72  --   --   --     Estimated Creatinine Clearance: 61.3 mL/min (by C-G formula based on SCr of 0.72 mg/dL).  Assessment: 63 YOF who continues on heparin for venous sinus thrombosis. Heparin level is 0.1 after increase to 1100 units/hr    Goal of Therapy:  Heparin level 0.3-0.5 units/ml Monitor platelets by anticoagulation protocol: Yes   Plan:  -Increase heparin to 1250 units/hr -Heparin level in 6 hours and daily wth CBC daily  Hildred Laser, Pharm D 04/14/2016 10:24 PM

## 2016-04-14 NOTE — Progress Notes (Addendum)
PT Cancellation Note  Patient Details Name: [name] MRN: [id number] DOB: 08-02-1950   Cancelled Treatment:    Reason Eval/Treat Not Completed: Patient not medically ready.  Spoke with Critical Care and Neurology MDs.  Will sign off PT at this time and need new order once appropriate.     Thornton Papas Kimarion Chery 04/14/2016, 8:12 AM

## 2016-04-15 DIAGNOSIS — R7881 Bacteremia: Secondary | ICD-10-CM

## 2016-04-15 DIAGNOSIS — B955 Unspecified streptococcus as the cause of diseases classified elsewhere: Secondary | ICD-10-CM

## 2016-04-15 DIAGNOSIS — G001 Pneumococcal meningitis: Secondary | ICD-10-CM

## 2016-04-15 DIAGNOSIS — J9600 Acute respiratory failure, unspecified whether with hypoxia or hypercapnia: Secondary | ICD-10-CM

## 2016-04-15 DIAGNOSIS — Z9911 Dependence on respirator [ventilator] status: Secondary | ICD-10-CM

## 2016-04-15 LAB — BASIC METABOLIC PANEL
Anion gap: 7 (ref 5–15)
BUN: 33 mg/dL — ABNORMAL HIGH (ref 6–20)
CO2: 24 mmol/L (ref 22–32)
Calcium: 8.2 mg/dL — ABNORMAL LOW (ref 8.9–10.3)
Chloride: 111 mmol/L (ref 101–111)
Creatinine, Ser: 0.68 mg/dL (ref 0.44–1.00)
GFR calc Af Amer: >60 mL/min (ref >60)
GFR calc non Af Amer: >60 mL/min (ref >60)
Glucose, Bld: 160 mg/dL — ABNORMAL HIGH (ref 65–99)
Potassium: 3.4 mmol/L — ABNORMAL LOW (ref 3.5–5.1)
Sodium: 142 mmol/L (ref 135–145)

## 2016-04-15 LAB — CBC
HCT: 25.3 % — ABNORMAL LOW (ref 36.0–46.0)
Hemoglobin: 8.9 g/dL — ABNORMAL LOW (ref 12.0–15.0)
MCH: 32.8 pg (ref 26.0–34.0)
MCHC: 35.2 g/dL (ref 30.0–36.0)
MCV: 93.4 fL (ref 78.0–100.0)
Platelets: 338 10*3/uL (ref 150–400)
RBC: 2.71 MIL/uL — ABNORMAL LOW (ref 3.87–5.11)
RDW: 14 % (ref 11.5–15.5)
WBC: 13.5 10*3/uL — ABNORMAL HIGH (ref 4.0–10.5)

## 2016-04-15 LAB — HEPARIN LEVEL (UNFRACTIONATED)
Heparin Unfractionated: 0.24 IU/mL — ABNORMAL LOW (ref 0.30–0.70)
Heparin Unfractionated: 0.33 IU/mL (ref 0.30–0.70)

## 2016-04-15 MED ORDER — FLUCONAZOLE IN SODIUM CHLORIDE 100-0.9 MG/50ML-% IV SOLN
100.0000 mg | INTRAVENOUS | Status: AC
  Administered 2016-04-15 – 2016-04-17 (×3): 100 mg via INTRAVENOUS
  Filled 2016-04-15 (×4): qty 50

## 2016-04-15 NOTE — Progress Notes (Signed)
This note also relates to the following rows which could not be included: SpO2 - Cannot attach notes to unvalidated device data  Pt placed back on full vent support due to decreased pt effort.  TEE planned for tomorrow, no plan to extubate today.

## 2016-04-15 NOTE — Progress Notes (Signed)
ANTICOAGULATION CONSULT NOTE - Follow Up Consult  Pharmacy Consult for Heparin  Indication: Venous sinus thrombosis   Allergies  Allergen Reactions  . Codeine Nausea And Vomiting  . Nsaids Other (See Comments)    Stomach ulcers   Patient Measurements: Height: 5\' 4"  (162.6 cm) Weight: 127 lb (57.6 kg) IBW/kg (Calculated) : 54.7  Vital Signs: Temp: 98.2 F (36.8 C) (02/05 1200) Temp Source: Oral (02/05 1200) BP: 129/74 (02/05 1400) Pulse Rate: 70 (02/05 1400)  Labs:  Recent Labs  04/13/16 0234 04/14/16 0309 04/14/16 0600  04/14/16 2115 04/15/16 0518 04/15/16 1419  HGB 9.4*  --  8.4*  --   --  8.9*  --   HCT 27.0*  --  24.3*  --   --  25.3*  --   PLT 389  --  361  --   --  338  --   LABPROT  --  13.7  --   --   --   --   --   INR  --  1.05  --   --   --   --   --   HEPARINUNFRC  --  <0.10*  --   < > 0.10* 0.24* 0.33  CREATININE 0.78 0.72  --   --   --  0.68  --   < > = values in this interval not displayed.  Estimated Creatinine Clearance: 61.3 mL/min (by C-G formula based on SCr of 0.68 mg/dL).  Assessment: 66 y/o F on heparin for venous sinus thrombosis. Repeat HL is therapeutic at 0.33 on heparin 1350 units/hr. No issues with infusion or bleeding noted.  Goal of Therapy:  Heparin level 0.3-0.5 units/ml Monitor platelets by anticoagulation protocol: Yes   Plan:  Continue heparin 1350 units/hr Daily HL Monitor s/sx of bleeding  Andrey Cota. Diona Foley, PharmD, [place] Clinical Pharmacist [phone] 04/15/2016,3:16 PM

## 2016-04-15 NOTE — Progress Notes (Signed)
PULMONARY / CRITICAL CARE MEDICINE   Name: [name] MRN: [id number] DOB: 01-25-1951    ADMISSION DATE:  04/12/2016 CONSULTATION DATE:  04/12/16  REFERRING MD:  Cheral Marker  CHIEF COMPLAINT:  AMS   SUBJECTIVE:   Improved MS since 2/4 On PSV on fentanyl gtt, some periods of apnea    VITAL SIGNS: BP 137/73   Pulse 67   Temp 97.7 F (36.5 C) (Axillary)   Resp 11   Ht 5\' 4"  (1.626 m)   Wt 57.6 kg (127 lb)   SpO2 100%   BMI 21.80 kg/m   HEMODYNAMICS:    VENTILATOR SETTINGS: Vent Mode: CPAP;PSV FiO2 (%):  [30 %] 30 % Set Rate:  [16 bmp] 16 bmp Vt Set:  [500 mL] 500 mL PEEP:  [5 cmH20] 5 cmH20 Pressure Support:  [5 cmH20-10 cmH20] 5 cmH20 Plateau Pressure:  [16 cmH20-23 cmH20] 20 cmH20  INTAKE / OUTPUT: I/O last 3 completed shifts: In: 4070.9 [I.V.:2743.9; NG/GT:862; IV Piggyback:465] Out: [id number] [Urine:1775]   PHYSICAL EXAMINATION: General: ill appearing woman, NAD on MV Neuro: awake, moving all ext, followed my commands and answered questions this am HEENT: ETT in place, no neck stiffness Cardiovascular: regular, no M Lungs: clear bilaterally Abdomen: soft, NT, benign, + BS Musculoskeletal: no deformities Skin: no rash  LABS:  BMET  Recent Labs Lab 04/13/16 0234 04/14/16 0309 04/15/16 0518  NA 141 141 142  K 4.3 4.2 3.4*  CL 106 108 111  CO2 26 21* 24  BUN 17 22* 33*  CREATININE 0.78 0.72 0.68  GLUCOSE 108* 107* 160*    Electrolytes  Recent Labs Lab 04/13/16 0234 04/14/16 0309 04/15/16 0518  CALCIUM 7.8* 8.2* 8.2*  MG 1.6* 1.8  --   PHOS 2.9 2.9  --     CBC  Recent Labs Lab 04/13/16 0234 04/14/16 0600 04/15/16 0518  WBC 39.4* 21.2* 13.5*  HGB 9.4* 8.4* 8.9*  HCT 27.0* 24.3* 25.3*  PLT 389 361 338    Coag's  Recent Labs Lab 04/12/16 0406 04/14/16 0309  APTT 25  --   INR 1.02 1.05    Sepsis Markers  Recent Labs Lab 04/12/16 0640  04/12/16 1016 04/13/16 0234 04/13/16 1232 04/14/16 0309 04/14/16 0600   LATICACIDVEN  --   < > 4.2*  --  0.9  --  0.8  PROCALCITON 27.30  --   --  74.56  --  53.58  --   < > = values in this interval not displayed.  ABG  Recent Labs Lab 04/12/16 1823  PHART 7.433  PCO2ART 43.2  PO2ART 483.0*    Liver Enzymes  Recent Labs Lab 04/12/16 0313  AST 40  ALT 36  ALKPHOS 224*  BILITOT 1.1  ALBUMIN 2.8*    Cardiac Enzymes No results for input(s): TROPONINI, PROBNP in the last 168 hours.  Glucose  Recent Labs Lab 04/13/16 1559  GLUCAP 111*    Imaging No results found.   STUDIES:  CT head 02/02 > negative. MRI brain 02/02 > diffuse subarachnoid leptomeningeal enhancement, trace layering debris, 4.5cm superior sagittal sinus thrombosis TTE 2/2 >> no vegetations, normal LV fxn, grade 1 diastolic dysfxn  CULTURES: Blood 02/02 > S pneumo Urine 02/02 >  ANTIBIOTICS: Vanc 02/02 > 2/3 Ceftriaxone 02/02 > Ampicillin 02/02 > 2/3 Acyclovir 02/02 > 2/3  SIGNIFICANT EVENTS: 02/02 > admit.  LINES/TUBES: ETT 2/3 >>   DISCUSSION: 66 y.o. female admitted 02/02 with acute encephalopathy and concern for acute CVA.  She received tPA at [phone]  but had no improvement afterwards.  Intubated 2/2 for airway protection. MRI and blood cx's show S pneumo meningitis   ASSESSMENT / PLAN:  NEUROLOGIC A:   Acute encephalopathy, improving Acute S pneumo meningitis / encephalitis Sagittal sinus thrombosis Hx chronic back pain (with subsequent narcotic abuse), fibromyalgia, arthritis. P:   t-PA was given for suspected CVA on presentation Appreciate ID assistance Continue ceftriaxone as below Propofol to off, use fentanyl gtt and wean as able No plan for LP at this time.  Call for TEE today.  Continue heparin for superior sagittal thrombus. Will need 3-6 months anticoagulation   INFECTIOUS A:   Sepsis due to pneumococcal meningitis and bacteremia P:   Continue Ceftriaxone and dexamethasone Follow cx senditivities Defer LP Plan for TEE to r/o  endocarditis  CARDIOVASCULAR A:  Hypertensive urgency. Hx HTN, murmur. P:  TEE requested 2/5 Hold her home carvedilol and lisinopril   PULMONARY A: Acute respiratory failure due to poor airway protection / encephalopathy Tobacco dependence. P:   PRVC 8cc/kg Tolerating PSV for now. No plan to extubate until after the TEE  RENAL A:   Lactic acidosis, cleared P:   Follow BMP NS 30cc/h  GASTROINTESTINAL A:   Nausea with vomiting. Nutrition. Hx GERD. P:   NPO Continue TF   HEMATOLOGIC A:   S/p tPA at 0435 on 04/12/16. sagittal sinus thrombosis due to meningitis P:  Heparin gtt, convert to long term anticoag once stabilizing  Follow CBC  ENDOCRINE A:   Hyperglycemia on steroids P:   Follow glucose  Family updated: Husband updated at bedside on 2/2, none present 2/5  Interdisciplinary Family Meeting v Palliative Care Meeting:  Due by: 04/19/16.  Independent CC time 34 minutes  Baltazar Apo, MD, PhD 04/15/2016, 11:59 AM [facility] [phone] or if no answer [phone]

## 2016-04-15 NOTE — Progress Notes (Signed)
ANTICOAGULATION CONSULT NOTE - Follow Up Consult  Pharmacy Consult for Heparin  Indication: Venous sinus thrombosis   Allergies  Allergen Reactions  . Codeine Nausea And Vomiting  . Nsaids Other (See Comments)    Stomach ulcers   Patient Measurements: Height: 5\' 4"  (162.6 cm) Weight: 127 lb (57.6 kg) IBW/kg (Calculated) : 54.7  Vital Signs: Temp: 97.4 F (36.3 C) (02/05 0400) Temp Source: Axillary (02/05 0400) BP: 138/76 (02/05 0600) Pulse Rate: 73 (02/05 0600)  Labs:  Recent Labs  04/13/16 0234  04/14/16 0309 04/14/16 0600 04/14/16 1307 04/14/16 2115 04/15/16 0518  HGB 9.4*  --   --  8.4*  --   --  8.9*  HCT 27.0*  --   --  24.3*  --   --  25.3*  PLT 389  --   --  361  --   --  338  LABPROT  --   --  13.7  --   --   --   --   INR  --   --  1.05  --   --   --   --   HEPARINUNFRC  --   < > <0.10*  --  <0.10* 0.10* 0.24*  CREATININE 0.78  --  0.72  --   --   --   --   < > = values in this interval not displayed.  Estimated Creatinine Clearance: 61.3 mL/min (by C-G formula based on SCr of 0.72 mg/dL).  Assessment: 66 y/o F on heparin for venous sinus thrombosis, heparin level remains low, no issues per RN.   Goal of Therapy:  Heparin level 0.3-0.5 units/ml Monitor platelets by anticoagulation protocol: Yes   Plan:  -Inc heparin to 1350 units/hr -1400 HL  Kamber Vignola 04/15/2016,6:38 AM

## 2016-04-15 NOTE — Progress Notes (Signed)
    [facility] has been requested to perform a transesophageal echocardiogram on [name] for sepsis.  After careful review of history and examination, the risks and benefits of transesophageal echocardiogram have been explained to the patient and her husband via telephone (pt is intubated) including risks of esophageal damage, perforation (1:10,000 risk), bleeding, pharyngeal hematoma as well as other potential complications associated with conscious sedation including aspiration, arrhythmia, respiratory failure and death. Alternatives to treatment were discussed, questions were answered. Patient and her husband are willing to proceed.   Pt is anemic with Hgb 8.9. She received t-PA for suspected stroke and is now on heparin. VS are stable and she has no overt active bleeding.   The procedure is scheduled for tomorrow 04/17/2015 at 1:00 pm at the bedside and will be done by Dr. Aundra Dubin.  [name], NP  04/15/2016 3:52 PM

## 2016-04-15 NOTE — Progress Notes (Signed)
SLP Cancellation Note  Patient Details Name: [name] MRN: [id number] DOB: 20-Sep-1950   Cancelled treatment:       Reason Eval/Treat Not Completed: Patient not medically ready. Please re-order SLP when ready.   Germain Osgood 04/15/2016, 9:20 AM  Germain Osgood, M.A. CCC-SLP [phone]

## 2016-04-15 NOTE — Progress Notes (Signed)
This note also relates to the following rows which could not be included: SpO2 - Cannot attach notes to unvalidated device data  Pt placed back on full vent support due to decreased pt effort.  RN notified.

## 2016-04-15 NOTE — Progress Notes (Signed)
Subjective:  On ventilator    Antibiotics:  Anti-infectives    Start     Dose/Rate Route Frequency Ordered Stop   04/15/16 1400  fluconazole (DIFLUCAN) IVPB 100 mg     100 mg 50 mL/hr over 60 Minutes Intravenous Every 24 hours 04/15/16 1200 04/18/16 1359   04/12/16 1800  cefTRIAXone (ROCEPHIN) 2 g in dextrose 5 % 50 mL IVPB     2 g 100 mL/hr over 30 Minutes Intravenous Every 12 hours 04/12/16 0514     04/12/16 1700  acyclovir (ZOVIRAX) 550 mg in dextrose 5 % 100 mL IVPB  Status:  Discontinued     550 mg 111 mL/hr over 60 Minutes Intravenous Every 12 hours 04/12/16 0514 04/13/16 1126   04/12/16 1600  vancomycin (VANCOCIN) 500 mg in sodium chloride 0.9 % 100 mL IVPB  Status:  Discontinued     500 mg 100 mL/hr over 60 Minutes Intravenous Every 12 hours 04/12/16 0514 04/14/16 1158   04/12/16 0800  ampicillin (OMNIPEN) 2 g in sodium chloride 0.9 % 50 mL IVPB  Status:  Discontinued     2 g 150 mL/hr over 20 Minutes Intravenous Every 4 hours 04/12/16 0514 04/13/16 1126   04/12/16 0515  acyclovir (ZOVIRAX) 550 mg in dextrose 5 % 100 mL IVPB     550 mg 111 mL/hr over 60 Minutes Intravenous  Once 04/12/16 0514 04/12/16 0800   04/12/16 0400  cefTRIAXone (ROCEPHIN) 2 g in dextrose 5 % 50 mL IVPB     2 g 100 mL/hr over 30 Minutes Intravenous  Once 04/12/16 0357 04/12/16 0658   04/12/16 0400  vancomycin (VANCOCIN) IVPB 1000 mg/200 mL premix     1,000 mg 200 mL/hr over 60 Minutes Intravenous  Once 04/12/16 0357 04/12/16 0536   04/12/16 0400  ampicillin (OMNIPEN) 2 g in sodium chloride 0.9 % 50 mL IVPB     2 g 150 mL/hr over 20 Minutes Intravenous  Once 04/12/16 0357 04/12/16 4859      Medications: Scheduled Meds: .  stroke: mapping our early stages of recovery book   Does not apply Once  . sodium chloride  50 mL Intravenous Once  . cefTRIAXone (ROCEPHIN)  IV  2 g Intravenous Q12H  . chlorhexidine gluconate (MEDLINE KIT)  15 mL Mouth Rinse BID  . dexamethasone  4 mg  Intravenous Q6H  . fluconazole (DIFLUCAN) IV  100 mg Intravenous Q24H  . levETIRAcetam  500 mg Intravenous Q12H  . mouth rinse  15 mL Mouth Rinse QID  . pantoprazole (PROTONIX) IV  40 mg Intravenous QHS   Continuous Infusions: . sodium chloride 30 mL/hr at 04/15/16 1200  . clevidipine Stopped (04/12/16 0515)  . feeding supplement (VITAL AF 1.2 CAL) 1,000 mL (04/15/16 1200)  . fentaNYL infusion INTRAVENOUS 100 mcg/hr (04/15/16 1200)  . heparin 1,350 Units/hr (04/15/16 1200)   PRN Meds:.acetaminophen **OR** acetaminophen (TYLENOL) oral liquid 160 mg/5 mL **OR** acetaminophen, bisacodyl, docusate, fentaNYL, midazolam, midazolam, ondansetron (ZOFRAN) IV, senna-docusate    Objective: Weight change:   Intake/Output Summary (Last 24 hours) at 04/15/16 1257 Last data filed at 04/15/16 1200  Cardenas per 24 hour  Intake           3164.5 ml  Output             1350 ml  Net           1814.5 ml   Blood pressure 101/90, pulse 88, temperature 97.7 F (36.5 C),  temperature source Axillary, resp. rate (!) 23, height _0  (1.626 m), weight 127 lb (57.6 kg), SpO2 99 %. Temp:  [97.3 F (36.3 C)-98.5 F (36.9 C)] 97.7 F (36.5 C) (02/05 0800) Pulse Rate:  [64-102] 88 (02/05 1200) Resp:  [10-24] 23 (02/05 1200) BP: (101-146)/(62-90) 101/90 (02/05 1200) SpO2:  [96 %-100 %] 99 % (02/05 1200) FiO2 (%):  [30 %] 30 % (02/05 1141)  Physical Exam: General: Alert and awake, oriented Nodding head in response to questions HEENT: anicteric sclera, pupils reactive to light and accommodation, EOMI CVS regular rate, normal r,  no murmur rubs or gallops Chest: clear to auscultation bilaterally, no wheezing, rales or rhonchi Abdomen: soft nontender, nondistended, normal bowel sounds, Extremities: no  clubbing or edema noted , + restraints  Neuro: nonfocal  CBC:   CBC Latest Ref Rng & Units 04/15/2016 04/14/2016 04/13/2016  WBC 4.0 - 10.5 K/uL 13.5(H) 21.2(H) 39.4(H)  Hemoglobin 12.0 - 15.0 g/dL 8.9(L)  8.4(L) 9.4(L)  Hematocrit 36.0 - 46.0 % 25.3(L) 24.3(L) 27.0(L)  Platelets 150 - 400 K/uL 338 361 389      BMET  Recent Labs  04/14/16 0309 04/15/16 0518  NA 141 142  K 4.2 3.4*  CL 108 111  CO2 21* 24  GLUCOSE 107* 160*  BUN 22* 33*  CREATININE 0.72 0.68  CALCIUM 8.2* 8.2*     Liver Panel  No results for input(s): PROT, ALBUMIN, AST, ALT, ALKPHOS, BILITOT, BILIDIR, IBILI in the last 72 hours.     Sedimentation Rate No results for input(s): ESRSEDRATE in the last 72 hours. C-Reactive Protein No results for input(s): CRP in the last 72 hours.  Micro Results: Recent Results (from the past 720 hour(s))  Blood Culture (routine x 2)     Status: Abnormal   Collection Time: 04/12/16  4:05 AM  Result Value Ref Range Status   Specimen Description BLOOD RIGHT HAND  Final   Special Requests IN PEDIATRIC BOTTLE 3ML  Final   Culture  Setup Time   Final    GRAM POSITIVE COCCI IN PAIRS IN PEDIATRIC BOTTLE CRITICAL RESULT CALLED TO, READ BACK BY AND VERIFIED WITH: M MACCIA,PHARMD AT 1610 04/12/16 BY L BENFIELD    Culture (A)  Final    STREPTOCOCCUS PNEUMONIAE SUSCEPTIBILITIES PERFORMED ON PREVIOUS CULTURE WITHIN THE LAST 5 DAYS.    Report Status 04/14/2016 FINAL  Final  Blood Culture (routine x 2)     Status: Abnormal   Collection Time: 04/12/16  4:15 AM  Result Value Ref Range Status   Specimen Description BLOOD LEFT FOREARM  Final   Special Requests BOTTLES DRAWN AEROBIC ONLY 5ML  Final   Culture  Setup Time   Final    GRAM POSITIVE COCCI IN CHAINS AEROBIC BOTTLE ONLY CRITICAL RESULT CALLED TO, READ BACK BY AND VERIFIED WITH: M MACCIA,PHARMD AT 1610 04/12/16 BY L BENFIELD    Culture STREPTOCOCCUS PNEUMONIAE (A)  Final   Report Status 04/14/2016 FINAL  Final   Organism ID, Bacteria STREPTOCOCCUS PNEUMONIAE  Final      Susceptibility   Streptococcus pneumoniae - MIC*    ERYTHROMYCIN <=0.12 SENSITIVE Sensitive     LEVOFLOXACIN 0.5 SENSITIVE Sensitive     PENICILLIN  <=0.06 SENSITIVE Sensitive     CEFTRIAXONE <=0.12 SENSITIVE Sensitive     * STREPTOCOCCUS PNEUMONIAE  Blood Culture ID Panel (Reflexed)     Status: Abnormal   Collection Time: 04/12/16  4:15 AM  Result Value Ref Range Status   Enterococcus species NOT  DETECTED NOT DETECTED Final   Listeria monocytogenes NOT DETECTED NOT DETECTED Final   Staphylococcus species NOT DETECTED NOT DETECTED Final   Staphylococcus aureus NOT DETECTED NOT DETECTED Final   Streptococcus species DETECTED (A) NOT DETECTED Final    Comment: CRITICAL RESULT CALLED TO, READ BACK BY AND VERIFIED WITH: M MACCIA,PHARMD AT 1610 04/12/16 BY L BENFIELD    Streptococcus agalactiae NOT DETECTED NOT DETECTED Final   Streptococcus pneumoniae DETECTED (A) NOT DETECTED Final    Comment: CRITICAL RESULT CALLED TO, READ BACK BY AND VERIFIED WITH: M MACCIA,PHARMD AT 1610 04/12/16 BY L BENFIELD    Streptococcus pyogenes NOT DETECTED NOT DETECTED Final   Acinetobacter baumannii NOT DETECTED NOT DETECTED Final   Enterobacteriaceae species NOT DETECTED NOT DETECTED Final   Enterobacter cloacae complex NOT DETECTED NOT DETECTED Final   Escherichia coli NOT DETECTED NOT DETECTED Final   Klebsiella oxytoca NOT DETECTED NOT DETECTED Final   Klebsiella pneumoniae NOT DETECTED NOT DETECTED Final   Proteus species NOT DETECTED NOT DETECTED Final   Serratia marcescens NOT DETECTED NOT DETECTED Final   Haemophilus influenzae NOT DETECTED NOT DETECTED Final   Neisseria meningitidis NOT DETECTED NOT DETECTED Final   Pseudomonas aeruginosa NOT DETECTED NOT DETECTED Final   Candida albicans NOT DETECTED NOT DETECTED Final   Candida glabrata NOT DETECTED NOT DETECTED Final   Candida krusei NOT DETECTED NOT DETECTED Final   Candida parapsilosis NOT DETECTED NOT DETECTED Final   Candida tropicalis NOT DETECTED NOT DETECTED Final  Urine culture     Status: None   Collection Time: 04/12/16  4:56 AM  Result Value Ref Range Status   Specimen  Description URINE, RANDOM  Final   Special Requests NONE  Final   Culture NO GROWTH  Final   Report Status 04/13/2016 FINAL  Final  MRSA PCR Screening     Status: None   Collection Time: 04/12/16  6:51 PM  Result Value Ref Range Status   MRSA by PCR NEGATIVE NEGATIVE Final    Comment:        The GeneXpert MRSA Assay (FDA approved for NASAL specimens only), is one component of a comprehensive MRSA colonization surveillance program. It is not intended to diagnose MRSA infection nor to guide or monitor treatment for MRSA infections.   Culture, blood (Routine X 2) w Reflex to ID Panel     Status: None (Preliminary result)   Collection Time: 04/13/16 12:35 PM  Result Value Ref Range Status   Specimen Description BLOOD LEFT HAND  Final   Special Requests IN PEDIATRIC BOTTLE 3CC  Final   Culture NO GROWTH < 24 HOURS  Final   Report Status PENDING  Incomplete    Studies/Results: Dg Chest Port 1 View  Result Date: 04/13/2016 CLINICAL DATA:  Acute respiratory failure. EXAM: PORTABLE CHEST 1 VIEW COMPARISON:  Chest x-ray from earlier same day FINDINGS: Endotracheal tube appears well positioned with tip just above the level of the carina. Enteric tube passes below the diaphragm. Heart size and mediastinal contours are normal. Lungs are clear. No pleural effusion or pneumothorax seen. Osseous structures about the chest are unremarkable. IMPRESSION: 1. Endotracheal tube well positioned with tip just above the level of the carina. 2. Enteric tube passes below the diaphragm. 3. Lungs are clear. Electronically Signed   By: Franki Cabot M.D.   On: 04/13/2016 17:34   Dg Abd Portable 1v  Result Date: 04/13/2016 CLINICAL DATA:  OG tube placement EXAM: PORTABLE ABDOMEN - 1 VIEW COMPARISON:  None. FINDINGS: OG tube appears appropriately positioned within the stomach. Paucity of bowel gas, grossly nonobstructive bowel gas pattern. No evidence of soft tissue mass or abnormal fluid collection. No evidence of  free intraperitoneal air seen. Lung bases are clear. Visualized osseous structures are unremarkable. IMPRESSION: OG tube appears appropriately positioned in the stomach. Electronically Signed   By: Franki Cabot M.D.   On: 04/13/2016 17:34      Assessment/Plan:  INTERVAL HISTORY: pt had weaning trial this am again   Active Problems:   Cerebral embolism with cerebral infarction   Stroke (cerebrum) (HCC)   Meningitis   Acute respiratory failure (HCC)    [name] is a 66 y.o. female with  pneumococcal bacteremia and meningitis with sagittal sinus thrombosis.  --Repeat blood cultures are no growth so far  --Continue ceftriaxone 2 g every 12 hours --Patient also on anticoagulation --Consider TEE     LOS: 3 days   Alcide Evener 04/15/2016, 12:57 PM

## 2016-04-15 NOTE — Progress Notes (Signed)
Pharmacy Antibiotic Note  [name] is a 66 y.o. female admitted on 04/12/2016 with bacteremia and meningitis.  Pharmacy has been consulted for ceftriaxone dosing. Pt is afebrile and WBC is elevated at 13.5. Dose remains appropriate.   Plan: Continue ceftriaxone 2gm IV Q12H F/u ID recs, clinical response and LOT  Height: 5\' 4"  (162.6 cm) Weight: 127 lb (57.6 kg) IBW/kg (Calculated) : 54.7  Temp (24hrs), Avg:98 F (36.7 C), Min:97.3 F (36.3 C), Max:99.2 F (37.3 C)   Recent Labs Lab 04/12/16 0313 04/12/16 0326  04/12/16 0645 04/12/16 0702 04/12/16 1016 04/13/16 0234 04/13/16 1232 04/14/16 0309 04/14/16 0600 04/15/16 0518  WBC 29.6*  --   --   --   --   --  39.4*  --   --  21.2* 13.5*  CREATININE 1.13* 1.00  --   --   --   --  0.78  --  0.72  --  0.68  LATICACIDVEN  --   --   < > 5.0* 5.45* 4.2*  --  0.9  --  0.8  --   < > = values in this interval not displayed.  Estimated Creatinine Clearance: 61.3 mL/min (by C-G formula based on SCr of 0.68 mg/dL).    Allergies  Allergen Reactions  . Codeine Nausea And Vomiting  . Nsaids Other (See Comments)    Stomach ulcers    Amp 2/2 >> 2/3 Acyclovir 2/2 >> 2/3 Vanc 2/2 >> 2/4 CTX 2/2 >>  2/2 BCx >> 2/2  S Pneumo (pan-S) 2/2 MRSA PCR >> neg 2/2 UCx >> NG 2/3 Blood - NGTD  Thank you for allowing pharmacy to be a part of this patient's care.  Aleatha Taite, Rande Lawman 04/15/2016 8:35 AM

## 2016-04-15 NOTE — Progress Notes (Signed)
Subjective: No new complaints. No acute events overnight. Husband at bedside.  Exam: Vitals:   04/15/16 0800 04/15/16 0900  BP: 134/85 127/81  Pulse: 84 82  Resp: 10 17  Temp:     Gen: Intubated. Off sedation. Resp: non-labored breathing, no acute distress Abd: soft, nt  Neuro: MS: Intubated and following all simple commands. Has some difficulty with following complex commands. Cn: EOM appear intact as she looks around. Pupils equal and reactive to light. Motor: RUE 3/5 and RLE 2/5. LUE 2/5 and LLE 2/5.  [id number] brisk throughout. ?left toe upgoing.  Impression:  66 years old female presented with left hemiplegia and was treated with IV tPA. Imaging confirmed meningitis and superior sagittal sinus thrombosis. She is now on IV abx under ID directions and anticoagulation with heparin drip. She is continuously improving.  Recommendations: -Cont with IV abx under direction of Id. -Cont with Heparin drip and may need to transition over to coumadin. Typically requires anticoagulation for 3 months and repeat imaging to confirm resolution of sinus thrombosis. Alternatively Jennye Moccasin can be considered as well prior to discharge for 3 months. -Cont with keppra 500mg  BID.  This patient is critically ill and at significant risk of neurological worsening, death and care requires constant monitoring of vital signs, hemodynamics,respiratory and cardiac monitoring, neurological assessment, discussion with family, other specialists and medical decision making of high complexity. I spent 30 minutes of neurocritical care time in the care of  this patient.  04/15/2016  9:51 AM

## 2016-04-16 ENCOUNTER — Inpatient Hospital Stay (HOSPITAL_COMMUNITY): Payer: Medicare Other

## 2016-04-16 DIAGNOSIS — G08 Intracranial and intraspinal phlebitis and thrombophlebitis: Secondary | ICD-10-CM

## 2016-04-16 DIAGNOSIS — J9601 Acute respiratory failure with hypoxia: Secondary | ICD-10-CM

## 2016-04-16 DIAGNOSIS — I340 Nonrheumatic mitral (valve) insufficiency: Secondary | ICD-10-CM

## 2016-04-16 LAB — CBC
HCT: 25.8 % — ABNORMAL LOW (ref 36.0–46.0)
Hemoglobin: 9 g/dL — ABNORMAL LOW (ref 12.0–15.0)
MCH: 32.5 pg (ref 26.0–34.0)
MCHC: 34.9 g/dL (ref 30.0–36.0)
MCV: 93.1 fL (ref 78.0–100.0)
Platelets: 388 10*3/uL (ref 150–400)
RBC: 2.77 MIL/uL — ABNORMAL LOW (ref 3.87–5.11)
RDW: 13.9 % (ref 11.5–15.5)
WBC: 11.6 10*3/uL — ABNORMAL HIGH (ref 4.0–10.5)

## 2016-04-16 LAB — BASIC METABOLIC PANEL
Anion gap: 12 (ref 5–15)
BUN: 33 mg/dL — ABNORMAL HIGH (ref 6–20)
CO2: 22 mmol/L (ref 22–32)
Calcium: 8.4 mg/dL — ABNORMAL LOW (ref 8.9–10.3)
Chloride: 110 mmol/L (ref 101–111)
Creatinine, Ser: 0.73 mg/dL (ref 0.44–1.00)
GFR calc Af Amer: >60 mL/min (ref >60)
GFR calc non Af Amer: >60 mL/min (ref >60)
Glucose, Bld: 153 mg/dL — ABNORMAL HIGH (ref 65–99)
Potassium: 3.5 mmol/L (ref 3.5–5.1)
Sodium: 144 mmol/L (ref 135–145)

## 2016-04-16 LAB — HEPARIN LEVEL (UNFRACTIONATED): Heparin Unfractionated: 0.35 IU/mL (ref 0.30–0.70)

## 2016-04-16 NOTE — Care Management Note (Signed)
Case Management Note  Patient Details  Name: Ryanna Nadal MRN: [id number] Date of Birth: 18-May-1950  Subjective/Objective:   Pt admitted on 04/12/16 with AMS, pneumococcal meningitis, bacteremia, and respiratory failure.  PTA, pt resided at home with husband.                     Action/Plan: Pt currently remains intubated; TEE performed at bedside today.  Will follow for discharge planning as pt progresses.    Expected Discharge Date:                  Expected Discharge Plan:     In-House Referral:     Discharge planning Services  CM Consult  Post Acute Care Choice:    Choice offered to:     DME Arranged:    DME Agency:     HH Arranged:    HH Agency:     Status of Service:  In process, will continue to follow  If discussed at Long Length of Stay Meetings, dates discussed:    Additional Comments:  Reinaldo Raddle, RN, BSN  Trauma/Neuro ICU Case Manager [phone]

## 2016-04-16 NOTE — Progress Notes (Signed)
This note also relates to the following rows which could not be included: SpO2 - Cannot attach notes to unvalidated device data  Pt placed back on full vent support due to decreased pt effort.  RN aware.

## 2016-04-16 NOTE — H&P (View-Only) (Signed)
PULMONARY / CRITICAL CARE MEDICINE   Name: [name] MRN: [id number] DOB: [birth year]    ADMISSION DATE:  04/12/2016 CONSULTATION DATE:  04/12/16  REFERRING MD:  Cheral Marker  CHIEF COMPLAINT:  AMS   SUBJECTIVE:   Improved MS since 2/4 On PSV on fentanyl gtt, some periods of apnea    VITAL SIGNS: BP 137/73   Pulse 67   Temp 97.7 F (36.5 C) (Axillary)   Resp 11   Ht 5\' 4"  (1.626 m)   Wt 57.6 kg (127 lb)   SpO2 100%   BMI 21.80 kg/m   HEMODYNAMICS:    VENTILATOR SETTINGS: Vent Mode: CPAP;PSV FiO2 (%):  [30 %] 30 % Set Rate:  [16 bmp] 16 bmp Vt Set:  [500 mL] 500 mL PEEP:  [5 cmH20] 5 cmH20 Pressure Support:  [5 cmH20-10 cmH20] 5 cmH20 Plateau Pressure:  [16 cmH20-23 cmH20] 20 cmH20  INTAKE / OUTPUT: I/O last 3 completed shifts: In: 4070.9 [I.V.:2743.9; NG/GT:862; IV Piggyback:465] Out: [id number] [Urine:1775]   PHYSICAL EXAMINATION: General: ill appearing woman, NAD on MV Neuro: awake, moving all ext, followed my commands and answered questions this am HEENT: ETT in place, no neck stiffness Cardiovascular: regular, no M Lungs: clear bilaterally Abdomen: soft, NT, benign, + BS Musculoskeletal: no deformities Skin: no rash  LABS:  BMET  Recent Labs Lab 04/13/16 0234 04/14/16 0309 04/15/16 0518  NA 141 141 142  K 4.3 4.2 3.4*  CL 106 108 111  CO2 26 21* 24  BUN 17 22* 33*  CREATININE 0.78 0.72 0.68  GLUCOSE 108* 107* 160*    Electrolytes  Recent Labs Lab 04/13/16 0234 04/14/16 0309 04/15/16 0518  CALCIUM 7.8* 8.2* 8.2*  MG 1.6* 1.8  --   PHOS 2.9 2.9  --     CBC  Recent Labs Lab 04/13/16 0234 04/14/16 0600 04/15/16 0518  WBC 39.4* 21.2* 13.5*  HGB 9.4* 8.4* 8.9*  HCT 27.0* 24.3* 25.3*  PLT 389 361 338    Coag's  Recent Labs Lab 04/12/16 0406 04/14/16 0309  APTT 25  --   INR 1.02 1.05    Sepsis Markers  Recent Labs Lab 04/12/16 0640  04/12/16 1016 04/13/16 0234 04/13/16 1232 04/14/16 0309 04/14/16 0600   LATICACIDVEN  --   < > 4.2*  --  0.9  --  0.8  PROCALCITON 27.30  --   --  74.56  --  53.58  --   < > = values in this interval not displayed.  ABG  Recent Labs Lab 04/12/16 1823  PHART 7.433  PCO2ART 43.2  PO2ART 483.0*    Liver Enzymes  Recent Labs Lab 04/12/16 0313  AST 40  ALT 36  ALKPHOS 224*  BILITOT 1.1  ALBUMIN 2.8*    Cardiac Enzymes No results for input(s): TROPONINI, PROBNP in the last 168 hours.  Glucose  Recent Labs Lab 04/13/16 1559  GLUCAP 111*    Imaging No results found.   STUDIES:  CT head 02/02 > negative. MRI brain 02/02 > diffuse subarachnoid leptomeningeal enhancement, trace layering debris, 4.5cm superior sagittal sinus thrombosis TTE 2/2 >> no vegetations, normal LV fxn, grade 1 diastolic dysfxn  CULTURES: Blood 02/02 > S pneumo Urine 02/02 >  ANTIBIOTICS: Vanc 02/02 > 2/3 Ceftriaxone 02/02 > Ampicillin 02/02 > 2/3 Acyclovir 02/02 > 2/3  SIGNIFICANT EVENTS: 02/02 > admit.  LINES/TUBES: ETT 2/3 >>   DISCUSSION: 67 y.o. female admitted 02/02 with acute encephalopathy and concern for acute CVA.  She received tPA at [phone]  but had no improvement afterwards.  Intubated 2/2 for airway protection. MRI and blood cx's show S pneumo meningitis   ASSESSMENT / PLAN:  NEUROLOGIC A:   Acute encephalopathy, improving Acute S pneumo meningitis / encephalitis Sagittal sinus thrombosis Hx chronic back pain (with subsequent narcotic abuse), fibromyalgia, arthritis. P:   t-PA was given for suspected CVA on presentation Appreciate ID assistance Continue ceftriaxone as below Propofol to off, use fentanyl gtt and wean as able No plan for LP at this time.  Call for TEE today.  Continue heparin for superior sagittal thrombus. Will need 3-6 months anticoagulation   INFECTIOUS A:   Sepsis due to pneumococcal meningitis and bacteremia P:   Continue Ceftriaxone and dexamethasone Follow cx senditivities Defer LP Plan for TEE to r/o  endocarditis  CARDIOVASCULAR A:  Hypertensive urgency. Hx HTN, murmur. P:  TEE requested 2/5 Hold her home carvedilol and lisinopril   PULMONARY A: Acute respiratory failure due to poor airway protection / encephalopathy Tobacco dependence. P:   PRVC 8cc/kg Tolerating PSV for now. No plan to extubate until after the TEE  RENAL A:   Lactic acidosis, cleared P:   Follow BMP NS 30cc/h  GASTROINTESTINAL A:   Nausea with vomiting. Nutrition. Hx GERD. P:   NPO Continue TF   HEMATOLOGIC A:   S/p tPA at 0435 on 04/12/16. sagittal sinus thrombosis due to meningitis P:  Heparin gtt, convert to long term anticoag once stabilizing  Follow CBC  ENDOCRINE A:   Hyperglycemia on steroids P:   Follow glucose  Family updated: Husband updated at bedside on 2/2, none present 2/5  Interdisciplinary Family Meeting v Palliative Care Meeting:  Due by: 04/19/16.  Independent CC time 34 minutes  Baltazar Apo, MD, PhD 04/15/2016, 11:59 AM [facility] [phone] or if no answer [phone]

## 2016-04-16 NOTE — Progress Notes (Signed)
Pt had TEE at bedside by Dr. Aundra Dubin.  Pt rec'd 8 mg Versed and 100 mcg Fentanyl.  She also was on Fentanyl drip at 300 mcg/hr.  Pt tolerated the procedure well, vital signs stable throughout.

## 2016-04-16 NOTE — CV Procedure (Signed)
Procedure: TEE  Indication: Bacteremia, assess for endocarditis  Sedation: Versed 8 mg IV, Fentanyl gtt running  Findings: Please see echo section for full report.  Normal LV size with mild LV hypertrophy.  EF 60-65%. Normal wall motion.  Normal right ventricular size and systolic function.  Normal atrial sizes.  No LA appendage thrombus.  Mild mitral regurgitation, no vegetation noted.  Trivial tricuspid regurgitation, no vegetation noted.  No pulmonic vegetation noted.  Trileaflet aortic valve with no significant regurgitation or stenosis, no vegetation noted.  Normal caliber aorta with minimal plaque.  No PFO or ASD noted.   No evidence for endocarditis.   Loralie Champagne 04/16/2016 1:56 PM

## 2016-04-16 NOTE — Progress Notes (Signed)
ANTICOAGULATION CONSULT NOTE - Follow Up Consult  Pharmacy Consult for Heparin  Indication: Venous sinus thrombosis   Allergies  Allergen Reactions  . Codeine Nausea And Vomiting  . Nsaids Other (See Comments)    Stomach ulcers   Patient Measurements: Height: 5\' 4"  (162.6 cm) Weight: 127 lb (57.6 kg) IBW/kg (Calculated) : 54.7  Vital Signs: Temp: 97.8 F (36.6 C) (02/06 0400) Temp Source: Axillary (02/06 0400) BP: 143/74 (02/06 0800) Pulse Rate: 67 (02/06 0802)  Labs:  Recent Labs  04/14/16 0309  04/14/16 0600  04/15/16 0518 04/15/16 1419 04/16/16 0311  HGB  --   < > 8.4*  --  8.9*  --  9.0*  HCT  --   --  24.3*  --  25.3*  --  25.8*  PLT  --   --  361  --  338  --  388  LABPROT 13.7  --   --   --   --   --   --   INR 1.05  --   --   --   --   --   --   HEPARINUNFRC <0.10*  --   --   < > 0.24* 0.33 0.35  CREATININE 0.72  --   --   --  0.68  --  0.73  < > = values in this interval not displayed.  Estimated Creatinine Clearance: 61.3 mL/min (by C-G formula based on SCr of 0.73 mg/dL).  Assessment: 66 y/o F on heparin for venous sinus thrombosis. Heparin level remains at goal at 0.35 on 1350 units/hr. CBC is stable and now bleeding noted.   Goal of Therapy:  Heparin level 0.3-0.5 units/ml Monitor platelets by anticoagulation protocol: Yes   Plan:  Continue heparin 1350 units/hr Daily HL Monitor s/sx of bleeding  Salome Arnt, PharmD, BCPS 04/16/2016 8:18 AM

## 2016-04-16 NOTE — Interval H&P Note (Signed)
History and Physical Interval Note:  04/16/2016 1:29 PM  [name]  has presented today for surgery, with the diagnosis of * No surgery found *  The various methods of treatment have been discussed with the patient and family. After consideration of risks, benefits and other options for treatment, the patient has consented to  * No surgery found * as a surgical intervention .  The patient's history has been reviewed, patient examined, no change in status, stable for surgery.  I have reviewed the patient's chart and labs.  Questions were answered to the patient's satisfaction.     Juliane Guest [employer]

## 2016-04-16 NOTE — Progress Notes (Signed)
PULMONARY / CRITICAL CARE MEDICINE   Name: Requel Muellner MRN: [id number] DOB: [birth year]    ADMISSION DATE:  04/12/2016 CONSULTATION DATE:  04/12/16  REFERRING MD:  Cheral Marker  CHIEF COMPLAINT:  AMS   SUBJECTIVE:   Has been apneic on PSV today, still on fentanyl TEE today   VITAL SIGNS: BP (!) 160/92   Pulse 82   Temp 98.3 F (36.8 C) (Axillary)   Resp 20   Ht 5\' 4"  (1.626 m)   Wt 57.6 kg (127 lb)   SpO2 100%   BMI 21.80 kg/m   HEMODYNAMICS:    VENTILATOR SETTINGS: Vent Mode: PRVC FiO2 (%):  [30 %] 30 % Set Rate:  [15 bmp-16 bmp] 15 bmp Vt Set:  [500 mL] 500 mL PEEP:  [5 cmH20] 5 cmH20 Pressure Support:  [5 cmH20] 5 cmH20 Plateau Pressure:  [15 cmH20-18 cmH20] 17 cmH20  INTAKE / OUTPUT: I/O last 3 completed shifts: In: 4298.7 [I.V.:2618.7; NG/GT:1220; IV Piggyback:460] Out: 2375 [Urine:2375]   PHYSICAL EXAMINATION: General: NAD on MV, awake Neuro: awake and nodding to questions, following commands HEENT: neck supple, ETT in place Cardiovascular: regular no M Lungs: clear no wheeze Abdomen: soft NT, + BS Musculoskeletal: no deformities, no edema Skin: no rash  LABS:  BMET  Recent Labs Lab 04/14/16 0309 04/15/16 0518 04/16/16 0311  NA 141 142 144  K 4.2 3.4* 3.5  CL 108 111 110  CO2 21* 24 22  BUN 22* 33* 33*  CREATININE 0.72 0.68 0.73  GLUCOSE 107* 160* 153*    Electrolytes  Recent Labs Lab 04/13/16 0234 04/14/16 0309 04/15/16 0518 04/16/16 0311  CALCIUM 7.8* 8.2* 8.2* 8.4*  MG 1.6* 1.8  --   --   PHOS 2.9 2.9  --   --     CBC  Recent Labs Lab 04/14/16 0600 04/15/16 0518 04/16/16 0311  WBC 21.2* 13.5* 11.6*  HGB 8.4* 8.9* 9.0*  HCT 24.3* 25.3* 25.8*  PLT 361 338 388    Coag's  Recent Labs Lab 04/12/16 0406 04/14/16 0309  APTT 25  --   INR 1.02 1.05    Sepsis Markers  Recent Labs Lab 04/12/16 0640  04/12/16 1016 04/13/16 0234 04/13/16 1232 04/14/16 0309 04/14/16 0600  LATICACIDVEN  --   < > 4.2*  --  0.9   --  0.8  PROCALCITON 27.30  --   --  74.56  --  53.58  --   < > = values in this interval not displayed.  ABG  Recent Labs Lab 04/12/16 1823  PHART 7.433  PCO2ART 43.2  PO2ART 483.0*    Liver Enzymes  Recent Labs Lab 04/12/16 0313  AST 40  ALT 36  ALKPHOS 224*  BILITOT 1.1  ALBUMIN 2.8*    Cardiac Enzymes No results for input(s): TROPONINI, PROBNP in the last 168 hours.  Glucose  Recent Labs Lab 04/13/16 1559  GLUCAP 111*    Imaging No results found.   STUDIES:  CT head 02/02 > negative. MRI brain 02/02 > diffuse subarachnoid leptomeningeal enhancement, trace layering debris, 4.5cm superior sagittal sinus thrombosis TTE 2/2 >> no vegetations, normal LV fxn, grade 1 diastolic dysfxn TEE 2/6 >> no vegetations seen  CULTURES: Blood 02/02 > S pneumo > pan-sens Urine 02/02 > negative  ANTIBIOTICS: Vanc 02/02 > 2/3 Ceftriaxone 02/02 > Ampicillin 02/02 > 2/3 Acyclovir 02/02 > 2/3  SIGNIFICANT EVENTS: 02/02 > admit.  LINES/TUBES: ETT 2/3 >>   DISCUSSION: 66 y.o. female admitted 02/02 with acute encephalopathy and  concern for acute CVA.  She received tPA at 0435 but had no improvement afterwards.  Intubated 2/2 for airway protection. MRI and blood cx's show S pneumo meningitis   ASSESSMENT / PLAN:  NEUROLOGIC A:   Acute encephalopathy, improving Acute S pneumo meningitis / encephalitis Sagittal sinus thrombosis Hx chronic back pain (with subsequent narcotic abuse), fibromyalgia, arthritis. P:   S/p suspected t-PA for suspected CVA Appreciate ID assistance ceftriaxone Fentanyl gtt, decrease as tolerated Defer LP Heparin gtt for sagittal sinus thrombus   INFECTIOUS A:   Sepsis due to pneumococcal meningitis and bacteremia. No evidence SBE on TEE 2/6 P:   Ceftriaxone + dexamethasone, ? Duration steroids. Will discuss w ID No plan LP  CARDIOVASCULAR A:  Hypertensive urgency. Hx HTN, murmur. P:  Hold her home carvedilol and lisinopril    PULMONARY A: Acute respiratory failure due to poor airway protection / encephalopathy Tobacco dependence. P:   Daily SBT, goal to PSV and possibly extubate following TEE  RENAL A:   Lactic acidosis, cleared P:   Follow BMP  GASTROINTESTINAL A:   Nausea with vomiting. Nutrition. Hx GERD. P:   continue TF  HEMATOLOGIC A:   S/p tPA at 0435 on 04/12/16. sagittal sinus thrombosis due to meningitis P:  Heparin gtt, convert to long term anticoag once stabilizing  Follow CBC  ENDOCRINE A:   Hyperglycemia on steroids P:   Follow glucose  Family updated: Husband updated at bedside on 2/2, none present 2/6  Interdisciplinary Family Meeting v Palliative Care Meeting:  Due by: 04/19/16.  Independent CC time 33 minutes  Baltazar Apo, MD, PhD 04/16/2016, 2:43 PM [facility] [phone] or if no answer [phone]

## 2016-04-16 NOTE — Progress Notes (Signed)
  Echocardiogram Echocardiogram Transesophageal has been performed.  [name] 04/16/2016, 2:03 PM

## 2016-04-16 NOTE — Progress Notes (Addendum)
Subjective:  On ventilator and agitated appears to want ET tube out   Antibiotics:  Anti-infectives    Start     Dose/Rate Route Frequency Ordered Stop   04/15/16 1400  fluconazole (DIFLUCAN) IVPB 100 mg     100 mg 50 mL/hr over 60 Minutes Intravenous Every 24 hours 04/15/16 1200 04/18/16 1359   04/12/16 1800  cefTRIAXone (ROCEPHIN) 2 g in dextrose 5 % 50 mL IVPB     2 g 100 mL/hr over 30 Minutes Intravenous Every 12 hours 04/12/16 0514     04/12/16 1700  acyclovir (ZOVIRAX) 550 mg in dextrose 5 % 100 mL IVPB  Status:  Discontinued     550 mg 111 mL/hr over 60 Minutes Intravenous Every 12 hours 04/12/16 0514 04/13/16 1126   04/12/16 1600  vancomycin (VANCOCIN) 500 mg in sodium chloride 0.9 % 100 mL IVPB  Status:  Discontinued     500 mg 100 mL/hr over 60 Minutes Intravenous Every 12 hours 04/12/16 0514 04/14/16 1158   04/12/16 0800  ampicillin (OMNIPEN) 2 g in sodium chloride 0.9 % 50 mL IVPB  Status:  Discontinued     2 g 150 mL/hr over 20 Minutes Intravenous Every 4 hours 04/12/16 0514 04/13/16 1126   04/12/16 0515  acyclovir (ZOVIRAX) 550 mg in dextrose 5 % 100 mL IVPB     550 mg 111 mL/hr over 60 Minutes Intravenous  Once 04/12/16 0514 04/12/16 0800   04/12/16 0400  cefTRIAXone (ROCEPHIN) 2 g in dextrose 5 % 50 mL IVPB     2 g 100 mL/hr over 30 Minutes Intravenous  Once 04/12/16 0357 04/12/16 0658   04/12/16 0400  vancomycin (VANCOCIN) IVPB 1000 mg/200 mL premix     1,000 mg 200 mL/hr over 60 Minutes Intravenous  Once 04/12/16 0357 04/12/16 0536   04/12/16 0400  ampicillin (OMNIPEN) 2 g in sodium chloride 0.9 % 50 mL IVPB     2 g 150 mL/hr over 20 Minutes Intravenous  Once 04/12/16 0357 04/12/16 0347      Medications: Scheduled Meds: .  stroke: mapping our early stages of recovery book   Does not apply Once  . sodium chloride  50 mL Intravenous Once  . cefTRIAXone (ROCEPHIN)  IV  2 g Intravenous Q12H  . chlorhexidine gluconate (MEDLINE KIT)  15 mL Mouth  Rinse BID  . dexamethasone  4 mg Intravenous Q6H  . fluconazole (DIFLUCAN) IV  100 mg Intravenous Q24H  . levETIRAcetam  500 mg Intravenous Q12H  . mouth rinse  15 mL Mouth Rinse QID  . pantoprazole (PROTONIX) IV  40 mg Intravenous QHS   Continuous Infusions: . sodium chloride 30 mL/hr at 04/16/16 1500  . clevidipine Stopped (04/12/16 0515)  . feeding supplement (VITAL AF 1.2 CAL) Stopped (04/16/16 0030)  . fentaNYL infusion INTRAVENOUS 300 mcg/hr (04/16/16 1500)  . heparin 1,350 Units/hr (04/16/16 1500)   PRN Meds:.acetaminophen **OR** acetaminophen (TYLENOL) oral liquid 160 mg/5 mL **OR** acetaminophen, bisacodyl, docusate, fentaNYL, midazolam, ondansetron (ZOFRAN) IV, senna-docusate    Objective: Weight change:   Intake/Output Summary (Last 24 hours) at 04/16/16 1612 Last data filed at 04/16/16 1500  Gross per 24 hour  Intake           2334.5 ml  Output             1250 ml  Net           1084.5 ml   Blood pressure (!) 164/88, pulse  75, temperature 98.3 F (36.8 C), temperature source Axillary, resp. rate 16, height 5\' 4"  (1.626 m), weight 127 lb (57.6 kg), SpO2 100 %. Temp:  [97.2 F (36.2 C)-98.3 F (36.8 C)] 98.3 F (36.8 C) (02/06 1200) Pulse Rate:  [66-93] 75 (02/06 1500) Resp:  [14-25] 16 (02/06 1500) BP: (99-164)/(65-92) 164/88 (02/06 1500) SpO2:  [97 %-100 %] 100 % (02/06 1500) FiO2 (%):  [30 %] 30 % (02/06 1203)  Physical Exam: General: Alert and awake, oriented Nodding head in response to questions also trying to mouth questions, gesturing HEENT: anicteric sclera, pupils reactive to light and accommodation, EOMI CVS regular rate, normal r,  no murmur rubs or gallops Chest: clear to auscultation bilaterally, no wheezing, rales or rhonchi Abdomen: soft nontender, nondistended, normal bowel sounds, Extremities: no  clubbing or edema noted , + restraints  Neuro: nonfocal  CBC:   CBC Latest Ref Rng & Units 04/16/2016 04/15/2016 04/14/2016  WBC 4.0 - 10.5 K/uL  11.6(H) 13.5(H) 21.2(H)  Hemoglobin 12.0 - 15.0 g/dL 9.0(L) 8.9(L) 8.4(L)  Hematocrit 36.0 - 46.0 % 25.8(L) 25.3(L) 24.3(L)  Platelets 150 - 400 K/uL 388 338 361      BMET  Recent Labs  04/15/16 0518 04/16/16 0311  NA 142 144  K 3.4* 3.5  CL 111 110  CO2 24 22  GLUCOSE 160* 153*  BUN 33* 33*  CREATININE 0.68 0.73  CALCIUM 8.2* 8.4*     Liver Panel  No results for input(s): PROT, ALBUMIN, AST, ALT, ALKPHOS, BILITOT, BILIDIR, IBILI in the last 72 hours.     Sedimentation Rate No results for input(s): ESRSEDRATE in the last 72 hours. C-Reactive Protein No results for input(s): CRP in the last 72 hours.  Micro Results: Recent Results (from the past 720 hour(s))  Blood Culture (routine x 2)     Status: Abnormal   Collection Time: 04/12/16  4:05 AM  Result Value Ref Range Status   Specimen Description BLOOD RIGHT HAND  Final   Special Requests IN PEDIATRIC BOTTLE 06/10/16  Final   Culture  Setup Time   Final    GRAM POSITIVE COCCI IN PAIRS IN PEDIATRIC BOTTLE CRITICAL RESULT CALLED TO, READ BACK BY AND VERIFIED WITH: M MACCIA,PHARMD AT 1610 04/12/16 BY L BENFIELD    Culture (A)  Final    STREPTOCOCCUS PNEUMONIAE SUSCEPTIBILITIES PERFORMED ON PREVIOUS CULTURE WITHIN THE LAST 5 DAYS.    Report Status 04/14/2016 FINAL  Final  Blood Culture (routine x 2)     Status: Abnormal   Collection Time: 04/12/16  4:15 AM  Result Value Ref Range Status   Specimen Description BLOOD LEFT FOREARM  Final   Special Requests BOTTLES DRAWN AEROBIC ONLY 06/10/16  Final   Culture  Setup Time   Final    GRAM POSITIVE COCCI IN CHAINS AEROBIC BOTTLE ONLY CRITICAL RESULT CALLED TO, READ BACK BY AND VERIFIED WITH: M MACCIA,PHARMD AT 1610 04/12/16 BY L BENFIELD    Culture STREPTOCOCCUS PNEUMONIAE (A)  Final   Report Status 04/14/2016 FINAL  Final   Organism ID, Bacteria STREPTOCOCCUS PNEUMONIAE  Final      Susceptibility   Streptococcus pneumoniae - MIC*    ERYTHROMYCIN <=0.12 SENSITIVE Sensitive      LEVOFLOXACIN 0.5 SENSITIVE Sensitive     PENICILLIN <=0.06 SENSITIVE Sensitive     CEFTRIAXONE <=0.12 SENSITIVE Sensitive     * STREPTOCOCCUS PNEUMONIAE  Blood Culture ID Panel (Reflexed)     Status: Abnormal   Collection Time: 04/12/16  4:15 AM  Result Value Ref Range Status   Enterococcus species NOT DETECTED NOT DETECTED Final   Listeria monocytogenes NOT DETECTED NOT DETECTED Final   Staphylococcus species NOT DETECTED NOT DETECTED Final   Staphylococcus aureus NOT DETECTED NOT DETECTED Final   Streptococcus species DETECTED (A) NOT DETECTED Final    Comment: CRITICAL RESULT CALLED TO, READ BACK BY AND VERIFIED WITH: M MACCIA,PHARMD AT 1610 04/12/16 BY L BENFIELD    Streptococcus agalactiae NOT DETECTED NOT DETECTED Final   Streptococcus pneumoniae DETECTED (A) NOT DETECTED Final    Comment: CRITICAL RESULT CALLED TO, READ BACK BY AND VERIFIED WITH: M MACCIA,PHARMD AT 1610 04/12/16 BY L BENFIELD    Streptococcus pyogenes NOT DETECTED NOT DETECTED Final   Acinetobacter baumannii NOT DETECTED NOT DETECTED Final   Enterobacteriaceae species NOT DETECTED NOT DETECTED Final   Enterobacter cloacae complex NOT DETECTED NOT DETECTED Final   Escherichia coli NOT DETECTED NOT DETECTED Final   Klebsiella oxytoca NOT DETECTED NOT DETECTED Final   Klebsiella pneumoniae NOT DETECTED NOT DETECTED Final   Proteus species NOT DETECTED NOT DETECTED Final   Serratia marcescens NOT DETECTED NOT DETECTED Final   Haemophilus influenzae NOT DETECTED NOT DETECTED Final   Neisseria meningitidis NOT DETECTED NOT DETECTED Final   Pseudomonas aeruginosa NOT DETECTED NOT DETECTED Final   Candida albicans NOT DETECTED NOT DETECTED Final   Candida glabrata NOT DETECTED NOT DETECTED Final   Candida krusei NOT DETECTED NOT DETECTED Final   Candida parapsilosis NOT DETECTED NOT DETECTED Final   Candida tropicalis NOT DETECTED NOT DETECTED Final  Urine culture     Status: None   Collection Time: 04/12/16   4:56 AM  Result Value Ref Range Status   Specimen Description URINE, RANDOM  Final   Special Requests NONE  Final   Culture NO GROWTH  Final   Report Status 04/13/2016 FINAL  Final  MRSA PCR Screening     Status: None   Collection Time: 04/12/16  6:51 PM  Result Value Ref Range Status   MRSA by PCR NEGATIVE NEGATIVE Final    Comment:        The GeneXpert MRSA Assay (FDA approved for NASAL specimens only), is one component of a comprehensive MRSA colonization surveillance program. It is not intended to diagnose MRSA infection nor to guide or monitor treatment for MRSA infections.   Culture, blood (Routine X 2) w Reflex to ID Panel     Status: None (Preliminary result)   Collection Time: 04/13/16 12:35 PM  Result Value Ref Range Status   Specimen Description BLOOD LEFT HAND  Final   Special Requests IN PEDIATRIC BOTTLE 3CC  Final   Culture NO GROWTH 3 DAYS  Final   Report Status PENDING  Incomplete    Studies/Results: Dg Abd Portable 1v  Result Date: 04/16/2016 CLINICAL DATA:  Orogastric tube.  Patient on vent. EXAM: PORTABLE ABDOMEN - 1 VIEW COMPARISON:  04/13/2016 FINDINGS: Orogastric tube has been advanced and terminates at the gastric antrum or pylorus. Cholecystectomy clips. No gross free intraperitoneal air. Nonobstructive bowel-gas pattern. IMPRESSION: Orogastric tube terminating in the distal stomach or pylorus. Electronically Signed   By: Abigail Miyamoto M.D.   On: 04/16/2016 14:42      Assessment/Plan:  INTERVAL HISTORY:   TEE negative for vegetations   Active Problems:   Cerebral embolism with cerebral infarction   Stroke (cerebrum) (HCC)   Meningitis   Acute respiratory failure (HCC)   Streptococcus pneumoniae meningitis   Streptococcal bacteremia  Ketura Sirek is a 66 y.o. female with  pneumococcal bacteremia and meningitis with suppurative sagittal sinus thrombosis.TEE negative  --Repeat blood cultures are no growth so far --Continue ceftriaxone 2 g  every 12 hours --Patient also on anticoagulation (though role of anticoagulation in suppurative sinus thrombosis is controversial)  DEXAMETHASONE THERAPY SHOULD BE DC after total of 4 days (which would be tomorrow)  While TEE is negative she still indeed has an INTRAVASCULAR infection and therefore should be treated at minimum with 4 weeks of high dose ceftriaxone from date of first negative culture which was 04/13/16  I would also recommend that we get a repeat MRI of the brain prior to stopping antibiotics to ensure that the thrombosis is resolving  I would plan on setting the patient up for 6 weeks of CTX to give [place] time to get a repeat MRI that is roughly 4 weeks from last one prior to stopping antibiotics   Diagnosis:  Pneumococcal bacteremia with septic sagittal sinus thrombosis   Culture Result: Pneumococcus  Allergies  Allergen Reactions  . Codeine Nausea And Vomiting  . Nsaids Other (See Comments)    Stomach ulcers    Discharge antibiotics: Ceftriaxone 2 g IV every 12 hours  Duration: 6 weeks  End Date:  05/24/16  [facility] Care Per Protocol:  Labs weekly while on IV antibiotics: x__ CBC with differential _x_ CMP   __ Please pull PIC at completion of IV antibiotics _x_ Please leave PIC in place until doctor has seen patient or been notified  Fax weekly labs to [phone]  Clinic Follow Up Appt:  Next 3 weeks.         LOS: 4 days   Alcide Evener 04/16/2016, 4:12 PM

## 2016-04-17 DIAGNOSIS — R7881 Bacteremia: Secondary | ICD-10-CM

## 2016-04-17 DIAGNOSIS — G08 Intracranial and intraspinal phlebitis and thrombophlebitis: Secondary | ICD-10-CM

## 2016-04-17 LAB — CBC
HCT: 26.3 % — ABNORMAL LOW (ref 36.0–46.0)
Hemoglobin: 9.2 g/dL — ABNORMAL LOW (ref 12.0–15.0)
MCH: 32.6 pg (ref 26.0–34.0)
MCHC: 35 g/dL (ref 30.0–36.0)
MCV: 93.3 fL (ref 78.0–100.0)
Platelets: 422 10*3/uL — ABNORMAL HIGH (ref 150–400)
RBC: 2.82 MIL/uL — ABNORMAL LOW (ref 3.87–5.11)
RDW: 13.7 % (ref 11.5–15.5)
WBC: 13 10*3/uL — ABNORMAL HIGH (ref 4.0–10.5)

## 2016-04-17 LAB — BASIC METABOLIC PANEL
Anion gap: 13 (ref 5–15)
BUN: 25 mg/dL — ABNORMAL HIGH (ref 6–20)
CO2: 21 mmol/L — ABNORMAL LOW (ref 22–32)
Calcium: 8.3 mg/dL — ABNORMAL LOW (ref 8.9–10.3)
Chloride: 106 mmol/L (ref 101–111)
Creatinine, Ser: 0.63 mg/dL (ref 0.44–1.00)
GFR calc Af Amer: >60 mL/min (ref >60)
GFR calc non Af Amer: >60 mL/min (ref >60)
Glucose, Bld: 154 mg/dL — ABNORMAL HIGH (ref 65–99)
Potassium: 3.7 mmol/L (ref 3.5–5.1)
Sodium: 140 mmol/L (ref 135–145)

## 2016-04-17 LAB — HEPARIN LEVEL (UNFRACTIONATED)
Heparin Unfractionated: 0.73 IU/mL — ABNORMAL HIGH (ref 0.30–0.70)
Heparin Unfractionated: 0.6 IU/mL (ref 0.30–0.70)
Heparin Unfractionated: 0.4 IU/mL (ref 0.30–0.70)

## 2016-04-17 MED ORDER — CHLORHEXIDINE GLUCONATE 0.12 % MT SOLN
OROMUCOSAL | Status: AC
  Filled 2016-04-17: qty 15

## 2016-04-17 MED ORDER — HYDROCODONE-ACETAMINOPHEN 7.5-325 MG PO TABS
1.0000 | ORAL_TABLET | Freq: Four times a day (QID) | ORAL | Status: DC | PRN
  Administered 2016-04-17 – 2016-04-23 (×10): 1 via ORAL
  Filled 2016-04-17 (×12): qty 1

## 2016-04-17 MED ORDER — METHADONE HCL 10 MG PO TABS
30.0000 mg | ORAL_TABLET | Freq: Every day | ORAL | Status: DC
  Administered 2016-04-17 – 2016-04-19 (×3): 30 mg via ORAL
  Filled 2016-04-17 (×3): qty 3

## 2016-04-17 MED ORDER — FENTANYL CITRATE (PF) 100 MCG/2ML IJ SOLN
12.5000 ug | INTRAMUSCULAR | Status: DC | PRN
  Administered 2016-04-17 – 2016-04-22 (×14): 25 ug via INTRAVENOUS
  Filled 2016-04-17 (×15): qty 2

## 2016-04-17 NOTE — Procedures (Signed)
Extubation Procedure Note  Patient Details:   Name: Corla Scritchfield DOB: [birth year] MRN: [id number]   Airway Documentation:     Evaluation  O2 sats: stable throughout Complications: No apparent complications Patient did tolerate procedure well. Bilateral Breath Sounds: Clear, Diminished   Yes   Positive cuff leak noted.  Pt placed on nasal cannula at 4 Lpm with humidity. No stridor noted. Pt able to get 1500 on incentive spirometer.  Mingo Amber Havilah Topor 04/17/2016, 10:45 AM

## 2016-04-17 NOTE — Progress Notes (Signed)
Pharmacy OPAT Consult  Indication: Pneumococcal bacteremia with septic sagittal sinus thrombosis Regimen: ceftriaxone 2g IV Q12H End date: 05/24/16  IV antibiotic discharge orders are pended. Please sign in New Orders (manage unsigned work) before discharge.   Thank you for allowing pharmacy to be a part of this patient's care,  Gwenlyn Perking, PharmD PGY1 Pharmacy Resident Pager: [phone] 04/17/2016 8:47 AM

## 2016-04-17 NOTE — Progress Notes (Signed)
[place] for Heparin  Indication: Venous sinus thrombosis   Allergies  Allergen Reactions  . Codeine Nausea And Vomiting  . Nsaids Other (See Comments)    Stomach ulcers   Patient Measurements: Height: 5\' 4"  (162.6 cm) Weight: 127 lb (57.6 kg) IBW/kg (Calculated) : 54.7  Vital Signs: Temp: 98 F (36.7 C) (02/07 1200) Temp Source: Oral (02/07 1200) BP: 161/91 (02/07 1200) Pulse Rate: 80 (02/07 1200)  Labs:  Recent Labs  04/15/16 0518  04/16/16 0311 04/17/16 0351 04/17/16 1400  HGB 8.9*  --  9.0* 9.2*  --   HCT 25.3*  --  25.8* 26.3*  --   PLT 338  --  388 422*  --   HEPARINUNFRC 0.24*  < > 0.35 0.73* 0.60  CREATININE 0.68  --  0.73 0.63  --   < > = values in this interval not displayed.  Estimated Creatinine Clearance: 61.3 mL/min (by C-G formula based on SCr of 0.63 mg/dL).  Assessment: 66 y/o Female with sinus thrombosis continue on IV heparin. Heparin level remains slightly elevated at 0.6 after rate reduction this AM. No bleeding noted.    Goal of Therapy:  Heparin level 0.3-0.5 units/ml Monitor platelets by anticoagulation protocol: Yes   Plan:  Decrease Heparin 1050 units/hr Check heparin level in 8 hours Daily heparin level and CBC  Salome Arnt, PharmD, BCPS 04/17/2016 2:42 PM

## 2016-04-17 NOTE — Progress Notes (Signed)
PULMONARY / CRITICAL CARE MEDICINE   Name: [name] MRN: [id number] DOB: Jul 05, 1950    ADMISSION DATE:  04/12/2016 CONSULTATION DATE:  04/12/16  REFERRING MD:  Cheral Marker  CHIEF COMPLAINT:  AMS   SUBJECTIVE:   Weaning well this morning, no events overnight. Sedation off.   VITAL SIGNS: BP (!) 133/94   Pulse 77   Temp 97.7 F (36.5 C) (Axillary)   Resp 19   Ht 5\' 4"  (1.626 m)   Wt 57.6 kg (127 lb)   SpO2 99%   BMI 21.80 kg/m   HEMODYNAMICS:    VENTILATOR SETTINGS: Vent Mode: CPAP;PSV FiO2 (%):  [30 %] 30 % Set Rate:  [15 bmp-16 bmp] 16 bmp Vt Set:  [500 mL] 500 mL PEEP:  [5 cmH20] 5 cmH20 Pressure Support:  [5 cmH20] 5 cmH20 Plateau Pressure:  [14 cmH20-18 cmH20] 17 cmH20  INTAKE / OUTPUT: I/O last 3 completed shifts: In: 4020.3 [I.V.:2666.3; NG/GT:844; IV Piggyback:510] Out: 2700 [Urine:2700]   PHYSICAL EXAMINATION: General: Adult female, no distress, lying in bed  Neuro: Awake, alert, following commands  HEENT: ETT in place  Cardiovascular: RRR, no MRG, NI S1/S2 Lungs: clear no wheeze Abdomen: Soft, non-tender, non-distended  Musculoskeletal: no deformities Skin: warm, dry, intact   LABS:  BMET  Recent Labs Lab 04/15/16 0518 04/16/16 0311 04/17/16 0351  NA 142 144 140  K 3.4* 3.5 3.7  CL 111 110 106  CO2 24 22 21*  BUN 33* 33* 25*  CREATININE 0.68 0.73 0.63  GLUCOSE 160* 153* 154*    Electrolytes  Recent Labs Lab 04/13/16 0234 04/14/16 0309 04/15/16 0518 04/16/16 0311 04/17/16 0351  CALCIUM 7.8* 8.2* 8.2* 8.4* 8.3*  MG 1.6* 1.8  --   --   --   PHOS 2.9 2.9  --   --   --     CBC  Recent Labs Lab 04/15/16 0518 04/16/16 0311 04/17/16 0351  WBC 13.5* 11.6* 13.0*  HGB 8.9* 9.0* 9.2*  HCT 25.3* 25.8* 26.3*  PLT 338 388 422*    Coag's  Recent Labs Lab 04/12/16 0406 04/14/16 0309  APTT 25  --   INR 1.02 1.05    Sepsis Markers  Recent Labs Lab 04/12/16 0640  04/12/16 1016 04/13/16 0234 04/13/16 1232  04/14/16 0309 04/14/16 0600  LATICACIDVEN  --   < > 4.2*  --  0.9  --  0.8  PROCALCITON 27.30  --   --  74.56  --  53.58  --   < > = values in this interval not displayed.  ABG  Recent Labs Lab 04/12/16 1823  PHART 7.433  PCO2ART 43.2  PO2ART 483.0*    Liver Enzymes  Recent Labs Lab 04/12/16 0313  AST 40  ALT 36  ALKPHOS 224*  BILITOT 1.1  ALBUMIN 2.8*    Cardiac Enzymes No results for input(s): TROPONINI, PROBNP in the last 168 hours.  Glucose  Recent Labs Lab 04/13/16 1559  GLUCAP 111*    Imaging Dg Abd Portable 1v  Result Date: 04/16/2016 CLINICAL DATA:  Orogastric tube.  Patient on vent. EXAM: PORTABLE ABDOMEN - 1 VIEW COMPARISON:  04/13/2016 FINDINGS: Orogastric tube has been advanced and terminates at the gastric antrum or pylorus. Cholecystectomy clips. No gross free intraperitoneal air. Nonobstructive bowel-gas pattern. IMPRESSION: Orogastric tube terminating in the distal stomach or pylorus. Electronically Signed   By: Abigail Miyamoto M.D.   On: 04/16/2016 14:42     STUDIES:  CT head 02/02 > negative. MRI brain 02/02 >  diffuse subarachnoid leptomeningeal enhancement, trace layering debris, 4.5cm superior sagittal sinus thrombosis TTE 2/2 >> no vegetations, normal LV fxn, grade 1 diastolic dysfxn TEE 2/6 >> no vegetations seen  CULTURES: Blood 02/02 > S pneumo > pan-sens Urine 02/02 > negative  ANTIBIOTICS: Vanc 02/02 > 2/3 Ceftriaxone 02/02 > Ampicillin 02/02 > 2/3 Acyclovir 02/02 > 2/3  SIGNIFICANT EVENTS: 02/02 > admit.  LINES/TUBES: ETT 2/3 > 2/7  DISCUSSION: 66 y.o. female admitted 02/02 with acute encephalopathy and concern for acute CVA.  She received tPA at 0435 but had no improvement afterwards.  Intubated 2/2 for airway protection. MRI and blood cx's show S pneumo meningitis   ASSESSMENT / PLAN:  NEUROLOGIC A:   Acute encephalopathy, improving Acute S pneumo meningitis / encephalitis Sagittal sinus thrombosis Hx chronic back  pain (with subsequent narcotic abuse), fibromyalgia, arthritis. S/p suspected t-PA for suspected CVA P:   ID following  Antibiotics as below  Heparin gtt for sagittal sinus thrombus  INFECTIOUS A:   Sepsis due to pneumococcal meningitis and bacteremia. No evidence SBE on TEE 2/6 P:   D/C Dexamethasone per ID after today's dose  Continue Ceftriaxone - stop date minimum 3/3  but prefer 3/17 per ID  Will need PICC before d/c   CARDIOVASCULAR A:  Hypertensive urgency. Hx HTN, murmur. P:  Hold her home carvedilol and lisinopril   PULMONARY A: Acute respiratory failure due to poor airway protection / encephalopathy Tobacco dependence. P:   Extubate  Maintain Oxygenation >92  RENAL A:   Lactic acidosis, cleared P:   Follow BMP  GASTROINTESTINAL A:   Nausea with vomiting. Nutrition. Hx GERD. P:   NPO PPI After extubation bedside swallow   HEMATOLOGIC A:   S/p tPA at 0435 on 04/12/16. sagittal sinus thrombosis due to meningitis P:  Heparin gtt, convert to long term anticoag once stabilizing  Follow CBC  ENDOCRINE A:   Hyperglycemia on steroids P:   Follow glucose  Family updated: Husband updated at bedside on 2/7  Interdisciplinary Family Meeting v Palliative Care Meeting:  Due by: 04/19/16.  [name], AG-ACNP [facility]  Pgr: [phone]  PCCM Pgr: [phone]  Attending Note:  I have examined patient, reviewed labs, studies and notes. I have discussed the case with Beaulah Corin, and I agree with the data and plans as amended above.   66 year old woman admitted with altered mental status and focal neurological symptoms ultimately diagnosed with pneumococcal bacteremia and meningitis/encephalitis. She has been treated with ceftriaxone and dexamethasone with clinical improvement. She's also been noted to have a sagittal sinus thrombosis and is on anticoagulation. A TEE performed on 2/6 did not show any evidence of  endocarditis. On my evaluation this morning she is awake, able to follow commands, moves all extremities. On questioning she nods affirmatively that she still has some residual headache. Lungs are clear bilaterally without any wheezing. She has no peripheral edema. She is tolerating pressure support with good lung volumes. She has a strong cough on command. We will plan to extubate her today. Continue her current antibiotics and steroids, plan to stop the dexamethasone today. Continue her heparin and consider transition to long-term anticoagulation.  Independent critical care time is 35 minutes.   Baltazar Apo, MD, PhD 04/17/2016, 10:41 AM [facility] [phone] or if no answer [phone]

## 2016-04-17 NOTE — Progress Notes (Signed)
Subjective:  Extubated and feeling better still has heaviness with trying to move her left leg   Antibiotics:  Anti-infectives    Start     Dose/Rate Route Frequency Ordered Stop   04/15/16 1400  fluconazole (DIFLUCAN) IVPB 100 mg     100 mg 50 mL/hr over 60 Minutes Intravenous Every 24 hours 04/15/16 1200 04/17/16 1558   04/12/16 1800  cefTRIAXone (ROCEPHIN) 2 g in dextrose 5 % 50 mL IVPB     2 g 100 mL/hr over 30 Minutes Intravenous Every 12 hours 04/12/16 0514     04/12/16 1700  acyclovir (ZOVIRAX) 550 mg in dextrose 5 % 100 mL IVPB  Status:  Discontinued     550 mg 111 mL/hr over 60 Minutes Intravenous Every 12 hours 04/12/16 0514 04/13/16 1126   04/12/16 1600  vancomycin (VANCOCIN) 500 mg in sodium chloride 0.9 % 100 mL IVPB  Status:  Discontinued     500 mg 100 mL/hr over 60 Minutes Intravenous Every 12 hours 04/12/16 0514 04/14/16 1158   04/12/16 0800  ampicillin (OMNIPEN) 2 g in sodium chloride 0.9 % 50 mL IVPB  Status:  Discontinued     2 g 150 mL/hr over 20 Minutes Intravenous Every 4 hours 04/12/16 0514 04/13/16 1126   04/12/16 0515  acyclovir (ZOVIRAX) 550 mg in dextrose 5 % 100 mL IVPB     550 mg 111 mL/hr over 60 Minutes Intravenous  Once 04/12/16 0514 04/12/16 0800   04/12/16 0400  cefTRIAXone (ROCEPHIN) 2 g in dextrose 5 % 50 mL IVPB     2 g 100 mL/hr over 30 Minutes Intravenous  Once 04/12/16 0357 04/12/16 0658   04/12/16 0400  vancomycin (VANCOCIN) IVPB 1000 mg/200 mL premix     1,000 mg 200 mL/hr over 60 Minutes Intravenous  Once 04/12/16 0357 04/12/16 0536   04/12/16 0400  ampicillin (OMNIPEN) 2 g in sodium chloride 0.9 % 50 mL IVPB     2 g 150 mL/hr over 20 Minutes Intravenous  Once 04/12/16 0357 04/12/16 [id number]      Medications: Scheduled Meds: .  stroke: mapping our early stages of recovery book   Does not apply Once  . cefTRIAXone (ROCEPHIN)  IV  2 g Intravenous Q12H  . dexamethasone  4 mg Intravenous Q6H  . levETIRAcetam  500 mg  Intravenous Q12H  . methadone  30 mg Oral Daily  . pantoprazole (PROTONIX) IV  40 mg Intravenous QHS   Continuous Infusions: . sodium chloride 30 mL/hr at 04/17/16 1600  . clevidipine Stopped (04/12/16 0515)  . heparin 1,050 Units/hr (04/17/16 1600)   PRN Meds:.acetaminophen **OR** acetaminophen (TYLENOL) oral liquid 160 mg/5 mL **OR** acetaminophen, bisacodyl, docusate, fentaNYL (SUBLIMAZE) injection, HYDROcodone-acetaminophen, ondansetron (ZOFRAN) IV, senna-docusate    Objective: Weight change:   Intake/Output Summary (Last 24 hours) at 04/17/16 1646 Last data filed at 04/17/16 1600  Gross per 24 hour  Intake          2366.45 ml  Output             2325 ml  Net            41.45 ml   Blood pressure 122/80, pulse 97, temperature 98 F (36.7 C), temperature source Oral, resp. rate (!) 22, height 5\' 4"  (1.626 m), weight 127 lb (57.6 kg), SpO2 95 %. Temp:  [97.4 F (36.3 C)-98.4 F (36.9 C)] 98 F (36.7 C) (02/07 1200) Pulse Rate:  [62-97] 97 (02/07 1600)  Resp:  [9-24] 22 (02/07 1600) BP: (109-167)/(66-96) 122/80 (02/07 1600) SpO2:  [93 %-100 %] 95 % (02/07 1600) FiO2 (%):  [30 %] 30 % (02/07 0810)  Physical Exam: General: Alert and awake, oriented Nodding head in response to questions also trying to mouth questions, gesturing HEENT: anicteric sclera, pupils reactive to light and accommodation, EOMI CVS regular rate, normal r,  no murmur rubs or gallops Chest: clear to auscultation bilaterally, no wheezing, rales or rhonchi Abdomen: soft nontender, nondistended, normal bowel sounds, Extremities: no  clubbing or edema noted ,  Neuro: Inability to move left lower extremity  CBC:   CBC Latest Ref Rng & Units 04/17/2016 04/16/2016 04/15/2016  WBC 4.0 - 10.5 K/uL 13.0(H) 11.6(H) 13.5(H)  Hemoglobin 12.0 - 15.0 g/dL 9.2(L) 9.0(L) 8.9(L)  Hematocrit 36.0 - 46.0 % 26.3(L) 25.8(L) 25.3(L)  Platelets 150 - 400 K/uL 422(H) 388 338      BMET  Recent Labs  04/16/16 0311  04/17/16 0351  NA 144 140  K 3.5 3.7  CL 110 106  CO2 22 21*  GLUCOSE 153* 154*  BUN 33* 25*  CREATININE 0.73 0.63  CALCIUM 8.4* 8.3*     Liver Panel  No results for input(s): PROT, ALBUMIN, AST, ALT, ALKPHOS, BILITOT, BILIDIR, IBILI in the last 72 hours.     Sedimentation Rate No results for input(s): ESRSEDRATE in the last 72 hours. C-Reactive Protein No results for input(s): CRP in the last 72 hours.  Micro Results: Recent Results (from the past 720 hour(s))  Blood Culture (routine x 2)     Status: Abnormal   Collection Time: 04/12/16  4:05 AM  Result Value Ref Range Status   Specimen Description BLOOD RIGHT HAND  Final   Special Requests IN PEDIATRIC BOTTLE 3ML  Final   Culture  Setup Time   Final    GRAM POSITIVE COCCI IN PAIRS IN PEDIATRIC BOTTLE CRITICAL RESULT CALLED TO, READ BACK BY AND VERIFIED WITH: M MACCIA,PHARMD AT 1610 04/12/16 BY L BENFIELD    Culture (A)  Final    STREPTOCOCCUS PNEUMONIAE SUSCEPTIBILITIES PERFORMED ON PREVIOUS CULTURE WITHIN THE LAST 5 DAYS.    Report Status 04/14/2016 FINAL  Final  Blood Culture (routine x 2)     Status: Abnormal   Collection Time: 04/12/16  4:15 AM  Result Value Ref Range Status   Specimen Description BLOOD LEFT FOREARM  Final   Special Requests BOTTLES DRAWN AEROBIC ONLY 5ML  Final   Culture  Setup Time   Final    GRAM POSITIVE COCCI IN CHAINS AEROBIC BOTTLE ONLY CRITICAL RESULT CALLED TO, READ BACK BY AND VERIFIED WITH: M MACCIA,PHARMD AT 1610 04/12/16 BY L BENFIELD    Culture STREPTOCOCCUS PNEUMONIAE (A)  Final   Report Status 04/14/2016 FINAL  Final   Organism ID, Bacteria STREPTOCOCCUS PNEUMONIAE  Final      Susceptibility   Streptococcus pneumoniae - MIC*    ERYTHROMYCIN <=0.12 SENSITIVE Sensitive     LEVOFLOXACIN 0.5 SENSITIVE Sensitive     PENICILLIN <=0.06 SENSITIVE Sensitive     CEFTRIAXONE <=0.12 SENSITIVE Sensitive     * STREPTOCOCCUS PNEUMONIAE  Blood Culture ID Panel (Reflexed)     Status:  Abnormal   Collection Time: 04/12/16  4:15 AM  Result Value Ref Range Status   Enterococcus species NOT DETECTED NOT DETECTED Final   Listeria monocytogenes NOT DETECTED NOT DETECTED Final   Staphylococcus species NOT DETECTED NOT DETECTED Final   Staphylococcus aureus NOT DETECTED NOT DETECTED Final  Streptococcus species DETECTED (A) NOT DETECTED Final    Comment: CRITICAL RESULT CALLED TO, READ BACK BY AND VERIFIED WITH: M MACCIA,PHARMD AT 1610 04/12/16 BY L BENFIELD    Streptococcus agalactiae NOT DETECTED NOT DETECTED Final   Streptococcus pneumoniae DETECTED (A) NOT DETECTED Final    Comment: CRITICAL RESULT CALLED TO, READ BACK BY AND VERIFIED WITH: M MACCIA,PHARMD AT 1610 04/12/16 BY L BENFIELD    Streptococcus pyogenes NOT DETECTED NOT DETECTED Final   Acinetobacter baumannii NOT DETECTED NOT DETECTED Final   Enterobacteriaceae species NOT DETECTED NOT DETECTED Final   Enterobacter cloacae complex NOT DETECTED NOT DETECTED Final   Escherichia coli NOT DETECTED NOT DETECTED Final   Klebsiella oxytoca NOT DETECTED NOT DETECTED Final   Klebsiella pneumoniae NOT DETECTED NOT DETECTED Final   Proteus species NOT DETECTED NOT DETECTED Final   Serratia marcescens NOT DETECTED NOT DETECTED Final   Haemophilus influenzae NOT DETECTED NOT DETECTED Final   Neisseria meningitidis NOT DETECTED NOT DETECTED Final   Pseudomonas aeruginosa NOT DETECTED NOT DETECTED Final   Candida albicans NOT DETECTED NOT DETECTED Final   Candida glabrata NOT DETECTED NOT DETECTED Final   Candida krusei NOT DETECTED NOT DETECTED Final   Candida parapsilosis NOT DETECTED NOT DETECTED Final   Candida tropicalis NOT DETECTED NOT DETECTED Final  Urine culture     Status: None   Collection Time: 04/12/16  4:56 AM  Result Value Ref Range Status   Specimen Description URINE, RANDOM  Final   Special Requests NONE  Final   Culture NO GROWTH  Final   Report Status 04/13/2016 FINAL  Final  MRSA PCR Screening      Status: None   Collection Time: 04/12/16  6:51 PM  Result Value Ref Range Status   MRSA by PCR NEGATIVE NEGATIVE Final    Comment:        The GeneXpert MRSA Assay (FDA approved for NASAL specimens only), is one component of a comprehensive MRSA colonization surveillance program. It is not intended to diagnose MRSA infection nor to guide or monitor treatment for MRSA infections.   Culture, blood (Routine X 2) w Reflex to ID Panel     Status: None (Preliminary result)   Collection Time: 04/13/16 12:35 PM  Result Value Ref Range Status   Specimen Description BLOOD LEFT HAND  Final   Special Requests IN PEDIATRIC BOTTLE 3CC  Final   Culture NO GROWTH 4 DAYS  Final   Report Status PENDING  Incomplete    Studies/Results: Dg Abd Portable 1v  Result Date: 04/16/2016 CLINICAL DATA:  Orogastric tube.  Patient on vent. EXAM: PORTABLE ABDOMEN - 1 VIEW COMPARISON:  04/13/2016 FINDINGS: Orogastric tube has been advanced and terminates at the gastric antrum or pylorus. Cholecystectomy clips. No gross free intraperitoneal air. Nonobstructive bowel-gas pattern. IMPRESSION: Orogastric tube terminating in the distal stomach or pylorus. Electronically Signed   By: Abigail Miyamoto M.D.   On: 04/16/2016 14:42      Assessment/Plan:  INTERVAL HISTORY:   TEE negative for vegetations  Patient was extubated   Active Problems:   Cerebral embolism with cerebral infarction   Stroke (cerebrum) (HCC)   Meningitis   Acute respiratory failure (HCC)   Streptococcus pneumoniae meningitis   Streptococcal bacteremia   Septic thrombophlebitis of sagittal sinus    [name] is a 66 y.o. female with  pneumococcal bacteremia and meningitis with suppurative sagittal sinus thrombosis.TEE negative  --Repeat blood cultures are no growth so far --Continue ceftriaxone 2 g  every 12 hours --Patient also on anticoagulation (though role of anticoagulation in suppurative sinus thrombosis is  controversial)  DEXAMETHASONE THERAPY HAS BEEN DISCONTINUED While TEE is negative she still indeed has an INTRAVASCULAR infection and therefore should be treated at minimum with 4 weeks of high dose ceftriaxone from date of first negative culture which was 04/13/16  I would also recommend that we get a repeat MRI of the brain prior to stopping antibiotics to ensure that the thrombosis is resolving  I would plan on setting the patient up for 6 weeks of CTX to give [place] time to get a repeat MRI that is roughly 4 weeks from last one prior to stopping antibiotics   Diagnosis:  Pneumococcal bacteremia with septic sagittal sinus thrombosis   Culture Result: Pneumococcus  Allergies  Allergen Reactions  . Codeine Nausea And Vomiting  . Nsaids Other (See Comments)    Stomach ulcers    Discharge antibiotics: Ceftriaxone 2 g IV every 12 hours  Duration: 6 weeks  End Date:  05/24/16  [facility] Care Per Protocol:  Labs weekly while on IV antibiotics: x__ CBC with differential _x_ CMP   __ Please pull PIC at completion of IV antibiotics _x_ Please leave PIC in place until doctor has seen patient or been notified  Fax weekly labs to [phone]  Clinic Follow Up Appt:  Next 3 weeks.    I will sign off for now please call with further questions.  I spent greater than 40 minutes with the patient including greater than 50% of time face-to-face counseling the patient and her son and other relative present with regards to her pneumococcal bacteremia with meningitis and dural septic thrombosis in coordination of her care     LOS: 5 days   Alcide Evener 04/17/2016, 4:46 PM

## 2016-04-17 NOTE — Progress Notes (Signed)
[place] for Heparin  Indication: Venous sinus thrombosis   Allergies  Allergen Reactions  . Codeine Nausea And Vomiting  . Nsaids Other (See Comments)    Stomach ulcers   Patient Measurements: Height: 5\' 4"  (162.6 cm) Weight: 127 lb (57.6 kg) IBW/kg (Calculated) : 54.7  Vital Signs: Temp: 97.7 F (36.5 C) (02/07 0400) Temp Source: Axillary (02/07 0400) BP: 138/70 (02/07 0400) Pulse Rate: 80 (02/07 0400)  Labs:  Recent Labs  04/14/16 0600  04/15/16 0518 04/15/16 1419 04/16/16 0311 04/17/16 0351  HGB 8.4*  --  8.9*  --  9.0*  --   HCT 24.3*  --  25.3*  --  25.8*  --   PLT 361  --  338  --  388  --   HEPARINUNFRC  --   < > 0.24* 0.33 0.35 0.73*  CREATININE  --   --  0.68  --  0.73  --   < > = values in this interval not displayed.  Estimated Creatinine Clearance: 61.3 mL/min (by C-G formula based on SCr of 0.73 mg/dL).  Assessment: 66 y/o Female with sinus thrombosis for heparin.   Goal of Therapy:  Heparin level 0.3-0.5 units/ml Monitor platelets by anticoagulation protocol: Yes   Plan:  Decrease Heparin 1150 units/hr Check heparin level in 8 hours.   Phillis Knack, PharmD, BCPS  04/17/2016 4:59 AM

## 2016-04-18 LAB — BASIC METABOLIC PANEL
Anion gap: 9 (ref 5–15)
BUN: 21 mg/dL — ABNORMAL HIGH (ref 6–20)
CO2: 24 mmol/L (ref 22–32)
Calcium: 8.4 mg/dL — ABNORMAL LOW (ref 8.9–10.3)
Chloride: 104 mmol/L (ref 101–111)
Creatinine, Ser: 0.59 mg/dL (ref 0.44–1.00)
GFR calc Af Amer: >60 mL/min (ref >60)
GFR calc non Af Amer: >60 mL/min (ref >60)
Glucose, Bld: 110 mg/dL — ABNORMAL HIGH (ref 65–99)
Potassium: 3.4 mmol/L — ABNORMAL LOW (ref 3.5–5.1)
Sodium: 137 mmol/L (ref 135–145)

## 2016-04-18 LAB — CULTURE, BLOOD (ROUTINE X 2): Culture: NO GROWTH

## 2016-04-18 LAB — CBC
HCT: 28.1 % — ABNORMAL LOW (ref 36.0–46.0)
Hemoglobin: 9.5 g/dL — ABNORMAL LOW (ref 12.0–15.0)
MCH: 31.5 pg (ref 26.0–34.0)
MCHC: 33.8 g/dL (ref 30.0–36.0)
MCV: 93 fL (ref 78.0–100.0)
Platelets: 433 10*3/uL — ABNORMAL HIGH (ref 150–400)
RBC: 3.02 MIL/uL — ABNORMAL LOW (ref 3.87–5.11)
RDW: 13.7 % (ref 11.5–15.5)
WBC: 24.7 10*3/uL — ABNORMAL HIGH (ref 4.0–10.5)

## 2016-04-18 LAB — PHOSPHORUS: Phosphorus: 2.6 mg/dL (ref 2.5–4.6)

## 2016-04-18 LAB — HEPARIN LEVEL (UNFRACTIONATED): Heparin Unfractionated: 0.48 IU/mL (ref 0.30–0.70)

## 2016-04-18 LAB — MAGNESIUM: Magnesium: 2 mg/dL (ref 1.7–2.4)

## 2016-04-18 MED ORDER — ENSURE ENLIVE PO LIQD
237.0000 mL | Freq: Two times a day (BID) | ORAL | Status: DC
  Administered 2016-04-20 (×2): 237 mL via ORAL

## 2016-04-18 MED ORDER — PNEUMOCOCCAL VAC POLYVALENT 25 MCG/0.5ML IJ INJ
0.5000 mL | INJECTION | INTRAMUSCULAR | Status: AC
  Administered 2016-04-19: 0.5 mL via INTRAMUSCULAR
  Filled 2016-04-18: qty 0.5

## 2016-04-18 NOTE — Progress Notes (Signed)
PULMONARY / CRITICAL CARE MEDICINE   Name: [name] MRN: [id number] DOB: Feb 08, 1951    ADMISSION DATE:  04/12/2016 CONSULTATION DATE:  04/12/16  REFERRING MD:  Cheral Marker  CHIEF COMPLAINT:  AMS   SUBJECTIVE:   Extubated without incident.  Still c/o some LLE weakness ? Some hallucinations earlier this am  VITAL SIGNS: BP (!) 158/84   Pulse 78   Temp 98.8 F (37.1 C) (Oral)   Resp 15   Ht 5\' 4"  (1.626 m)   Wt 57.6 kg (127 lb)   SpO2 96%   BMI 21.80 kg/m   HEMODYNAMICS:    VENTILATOR SETTINGS:    INTAKE / OUTPUT: I/O last 3 completed shifts: In: 2748 [I.V.:1828; NG/GT:560; IV Piggyback:360] Out: 3025 [Urine:3025]   PHYSICAL EXAMINATION: General: ill appearing NAD on RA Neuro: Awake, oriented, alert, normal strength R, 4+/5 L LE, indicated that she has had some visual hallucinations earlier HEENT: OP clear, no lesions Cardiovascular: regular, no M Lungs: clear bilaterally,  Abdomen: soft and benign Musculoskeletal: no deformity Skin: no rash  LABS:  BMET  Recent Labs Lab 04/16/16 0311 04/17/16 0351 04/18/16 0349  NA 144 140 137  K 3.5 3.7 3.4*  CL 110 106 104  CO2 22 21* 24  BUN 33* 25* 21*  CREATININE 0.73 0.63 0.59  GLUCOSE 153* 154* 110*    Electrolytes  Recent Labs Lab 04/13/16 0234 04/14/16 0309  04/16/16 0311 04/17/16 0351 04/18/16 0349  CALCIUM 7.8* 8.2*  < > 8.4* 8.3* 8.4*  MG 1.6* 1.8  --   --   --  2.0  PHOS 2.9 2.9  --   --   --  2.6  < > = values in this interval not displayed.  CBC  Recent Labs Lab 04/16/16 0311 04/17/16 0351 04/18/16 0349  WBC 11.6* 13.0* 24.7*  HGB 9.0* 9.2* 9.5*  HCT 25.8* 26.3* 28.1*  PLT 388 422* 433*    Coag's  Recent Labs Lab 04/12/16 0406 04/14/16 0309  APTT 25  --   INR 1.02 1.05    Sepsis Markers  Recent Labs Lab 04/12/16 0640  04/12/16 1016 04/13/16 0234 04/13/16 1232 04/14/16 0309 04/14/16 0600  LATICACIDVEN  --   < > 4.2*  --  0.9  --  0.8  PROCALCITON 27.30  --    --  74.56  --  53.58  --   < > = values in this interval not displayed.  ABG  Recent Labs Lab 04/12/16 1823  PHART 7.433  PCO2ART 43.2  PO2ART 483.0*    Liver Enzymes  Recent Labs Lab 04/12/16 0313  AST 40  ALT 36  ALKPHOS 224*  BILITOT 1.1  ALBUMIN 2.8*    Cardiac Enzymes No results for input(s): TROPONINI, PROBNP in the last 168 hours.  Glucose  Recent Labs Lab 04/13/16 1559  GLUCAP 111*    Imaging No results found.   STUDIES:  CT head 02/02 > negative. MRI brain 02/02 > diffuse subarachnoid leptomeningeal enhancement, trace layering debris, 4.5cm superior sagittal sinus thrombosis TTE 2/2 >> no vegetations, normal LV fxn, grade 1 diastolic dysfxn TEE 2/6 >> no vegetations seen  CULTURES: Blood 02/02 > S pneumo > pan-sens Urine 02/02 > negative  ANTIBIOTICS: Vanc 02/02 > 2/3 Ceftriaxone high dose 02/02 > Ampicillin 02/02 > 2/3 Acyclovir 02/02 > 2/3  SIGNIFICANT EVENTS: 02/02 > admit.  LINES/TUBES: ETT 2/3 > 2/7  DISCUSSION: 66 y.o. female admitted 02/02 with acute encephalopathy and concern for acute CVA.  She received tPA  at 0435 but had no improvement afterwards.  Intubated 2/2 for airway protection. MRI and blood cx's show S pneumo meningitis. Extubated 2/7.   ASSESSMENT / PLAN:  NEUROLOGIC A:   Acute encephalopathy, improved Acute S pneumo meningitis / encephalitis Sagittal sinus thrombosis Hx chronic back pain (with subsequent narcotic abuse), fibromyalgia, arthritis. S/p suspected t-PA for suspected CVA on presentation P:   ID recommends 6 weeks high dose ceftriaxone, repeat MRI brain in about 1 month Currently on heparin for sagittal sinus suppurative thrombosis, although note that per ID recs anticoagulation in this setting is controversial. Will continue heparin gtt for now, would revisit the issue with ID to determine duration and whether she needs to be transitioned to PO anticoagulation.  Will need PICC  INFECTIOUS A:    Sepsis due to pneumococcal meningitis and bacteremia. No evidence SBE on TEE 2/6 P:   D/C'd steroids after 2/7 dose.  As above, ceftriaxone planned for 6 weeks; ? Duration antiocoag  CARDIOVASCULAR A:  Hypertensive urgency. Hx HTN, murmur. P:  Goal restart home BP regimen soon  PULMONARY A: Acute respiratory failure due to poor airway protection / encephalopathy Tobacco dependence. P:   Extubated. Push pulm hygiene Wean to RA  RENAL A:   Lactic acidosis, cleared P:   Follow BP intermittently  GASTROINTESTINAL A:   Nausea with vomiting, resolved Nutrition. Hx GERD. P:   Advance diet 2/8  HEMATOLOGIC A:   S/p tPA at 0435 on 04/12/16. sagittal sinus thrombosis due to meningitis Leukocytosis  P:  Heparin gtt, need to speak with Dr Tommy Medal regarding indication for longer term anticoag Follow CBC  ENDOCRINE A:   Hyperglycemia on steroids P:   Follow glucose Insulin if indicated  Family updated: Husband updated at bedside on 2/7  Interdisciplinary Family Meeting v Palliative Care Meeting:  Due by: 04/19/16.  Will transfer to floor bed on 2/8, to [facility] as of 2/9.   Baltazar Apo, MD, PhD 04/18/2016, 11:41 AM [facility] [phone] or if no answer [phone]

## 2016-04-18 NOTE — Progress Notes (Signed)
[place] for Heparin  Indication: Venous sinus thrombosis   Allergies  Allergen Reactions  . Codeine Nausea And Vomiting  . Nsaids Other (See Comments)    Stomach ulcers   Patient Measurements: Height: 5\' 4"  (162.6 cm) Weight: 127 lb (57.6 kg) IBW/kg (Calculated) : 54.7  Vital Signs: Temp: 98.8 F (37.1 C) (02/08 0000) Temp Source: Oral (02/08 0000) BP: 151/107 (02/07 2200) Pulse Rate: 93 (02/07 2200)  Labs:  Recent Labs  04/15/16 0518  04/16/16 0311 04/17/16 0351 04/17/16 1400 04/17/16 2245  HGB 8.9*  --  9.0* 9.2*  --   --   HCT 25.3*  --  25.8* 26.3*  --   --   PLT 338  --  388 422*  --   --   HEPARINUNFRC 0.24*  < > 0.35 0.73* 0.60 0.40  CREATININE 0.68  --  0.73 0.63  --   --   < > = values in this interval not displayed.  Estimated Creatinine Clearance: 61.3 mL/min (by C-G formula based on SCr of 0.63 mg/dL).  Assessment: 66 y/o Female with sinus thrombosis continues on IV heparin. Heparin level now therapeutic (0.4) on gtt at 1050 units/hr. No bleeding noted.    Goal of Therapy:  Heparin level 0.3-0.5 units/ml Monitor platelets by anticoagulation protocol: Yes   Plan:  Continue Heparin 1050 units/hr Daily heparin level and CBC  Sherlon Handing, PharmD, BCPS Clinical pharmacist, pager [phone] 04/18/2016 12:27 AM

## 2016-04-18 NOTE — Progress Notes (Signed)
[place] for Heparin  Indication: Venous sinus thrombosis   Allergies  Allergen Reactions  . Codeine Nausea And Vomiting  . Nsaids Other (See Comments)    Stomach ulcers   Patient Measurements: Height: 5\' 4"  (162.6 cm) Weight: 127 lb (57.6 kg) IBW/kg (Calculated) : 54.7  Vital Signs: Temp: 98.8 F (37.1 C) (02/08 0800) Temp Source: Oral (02/08 0800) BP: 158/84 (02/08 0900) Pulse Rate: 78 (02/08 0800)  Labs:  Recent Labs  04/16/16 0311 04/17/16 0351 04/17/16 1400 04/17/16 2245 04/18/16 0349  HGB 9.0* 9.2*  --   --  9.5*  HCT 25.8* 26.3*  --   --  28.1*  PLT 388 422*  --   --  433*  HEPARINUNFRC 0.35 0.73* 0.60 0.40 0.48  CREATININE 0.73 0.63  --   --  0.59    Estimated Creatinine Clearance: 61.3 mL/min (by C-G formula based on SCr of 0.59 mg/dL).  Assessment: 66 y/o Female with sinus thrombosis continues on IV heparin. Heparin level remains therapeutic (0.48) on gtt at 1050 units/hr. No bleeding noted.    Goal of Therapy:  Heparin level 0.3-0.5 units/ml Monitor platelets by anticoagulation protocol: Yes   Plan:  Continue Heparin 1050 units/hr Daily heparin level and CBC  Salome Arnt, PharmD, BCPS Pager # [phone] 04/18/2016 9:54 AM

## 2016-04-18 NOTE — Progress Notes (Signed)
Patient arrived to floor. Will continue monitor.

## 2016-04-18 NOTE — Progress Notes (Signed)
Nutrition Follow-up  INTERVENTION:   Ensure Enlive po BID, each supplement provides 350 kcal and 20 grams of protein  NUTRITION DIAGNOSIS:   Inadequate oral intake related to  (decrased appetite) as evidenced by per patient/family report. Ongoing.   GOAL:   Patient will meet greater than or equal to 90% of their needs Progressing.   MONITOR:   PO intake, Supplement acceptance, I & O's  ASSESSMENT:   66 y.o. female admitted 02/02 with acute encephalopathy and concern for acute CVA.  She received tPA at 0435 but had no improvement afterwards.  Intubated 2/2 for airway protection. MRI and blood cx's show S pneumo meningitis   2/7 extubated 2/8 diet advanced On IV antibiotics for pneumococcal bacteremia and meningitis, feeling weak today  Diet Order:  Diet regular Room service appropriate? Yes; Fluid consistency: Thin  Skin:  Reviewed, no issues  Last BM:  Unknown  Height:   Ht Readings from Last 1 Encounters:  04/12/16 5\' 4"  (1.626 m)    Weight:   Wt Readings from Last 1 Encounters:  04/12/16 127 lb (57.6 kg)    Ideal Body Weight:  54.5 kg  BMI:  Body mass index is 21.8 kg/m.  Estimated Nutritional Needs:   Kcal:  1500-1700  Protein:  75-85 grams  Fluid:  > 1.5 L/day  EDUCATION NEEDS:   No education needs identified at this time  [place], [place], [place] Pager [phone] After Hours Pager

## 2016-04-18 NOTE — Progress Notes (Signed)
Pt extubated successfully yesterday; pt states she if feeling better, though weak.  She states she will have help at home from her husband, and a son that lives in [place].  She feels that she will not be able to walk due to weakness.  Recommend PT consult to evaluate for home needs.  Noted ID recommending 6 weeks IV antibiotics for treatment of pneumococcal bacteremia and meningitis.  Await PT recommendations for LOC needed upon dc.  Will follow progress.    Reinaldo Raddle, RN, BSN  Trauma/Neuro ICU Case Manager [phone]

## 2016-04-19 DIAGNOSIS — I8290 Acute embolism and thrombosis of unspecified vein: Secondary | ICD-10-CM

## 2016-04-19 DIAGNOSIS — D649 Anemia, unspecified: Secondary | ICD-10-CM

## 2016-04-19 DIAGNOSIS — Z720 Tobacco use: Secondary | ICD-10-CM

## 2016-04-19 DIAGNOSIS — R4182 Altered mental status, unspecified: Secondary | ICD-10-CM

## 2016-04-19 LAB — BASIC METABOLIC PANEL
Anion gap: 10 (ref 5–15)
BUN: 11 mg/dL (ref 6–20)
CO2: 25 mmol/L (ref 22–32)
Calcium: 8 mg/dL — ABNORMAL LOW (ref 8.9–10.3)
Chloride: 102 mmol/L (ref 101–111)
Creatinine, Ser: 0.68 mg/dL (ref 0.44–1.00)
GFR calc Af Amer: >60 mL/min (ref >60)
GFR calc non Af Amer: >60 mL/min (ref >60)
Glucose, Bld: 91 mg/dL (ref 65–99)
Potassium: 3.1 mmol/L — ABNORMAL LOW (ref 3.5–5.1)
Sodium: 137 mmol/L (ref 135–145)

## 2016-04-19 LAB — CBC
HCT: 29.7 % — ABNORMAL LOW (ref 36.0–46.0)
Hemoglobin: 9.9 g/dL — ABNORMAL LOW (ref 12.0–15.0)
MCH: 31.3 pg (ref 26.0–34.0)
MCHC: 33.3 g/dL (ref 30.0–36.0)
MCV: 94 fL (ref 78.0–100.0)
Platelets: 453 10*3/uL — ABNORMAL HIGH (ref 150–400)
RBC: 3.16 MIL/uL — ABNORMAL LOW (ref 3.87–5.11)
RDW: 13.5 % (ref 11.5–15.5)
WBC: 16.1 10*3/uL — ABNORMAL HIGH (ref 4.0–10.5)

## 2016-04-19 LAB — HEPARIN LEVEL (UNFRACTIONATED)
Heparin Unfractionated: 0.28 IU/mL — ABNORMAL LOW (ref 0.30–0.70)
Heparin Unfractionated: 0.31 IU/mL (ref 0.30–0.70)

## 2016-04-19 MED ORDER — POTASSIUM CHLORIDE CRYS ER 20 MEQ PO TBCR
40.0000 meq | EXTENDED_RELEASE_TABLET | Freq: Once | ORAL | Status: AC
  Administered 2016-04-19: 40 meq via ORAL
  Filled 2016-04-19: qty 2

## 2016-04-19 MED ORDER — LEVETIRACETAM 500 MG PO TABS
500.0000 mg | ORAL_TABLET | Freq: Two times a day (BID) | ORAL | Status: DC
  Administered 2016-04-19 – 2016-04-23 (×8): 500 mg via ORAL
  Filled 2016-04-19 (×9): qty 1

## 2016-04-19 MED ORDER — PANTOPRAZOLE SODIUM 40 MG PO TBEC
40.0000 mg | DELAYED_RELEASE_TABLET | Freq: Every day | ORAL | Status: DC
Start: 2016-04-19 — End: 2016-04-23
  Administered 2016-04-19 – 2016-04-22 (×4): 40 mg via ORAL
  Filled 2016-04-19 (×4): qty 1

## 2016-04-19 MED ORDER — RIVAROXABAN 15 MG PO TABS
15.0000 mg | ORAL_TABLET | Freq: Two times a day (BID) | ORAL | Status: DC
  Administered 2016-04-19 – 2016-04-23 (×8): 15 mg via ORAL
  Filled 2016-04-19 (×8): qty 1

## 2016-04-19 MED ORDER — PHENOL 1.4 % MT LIQD
1.0000 | OROMUCOSAL | Status: DC | PRN
  Administered 2016-04-19: 1 via OROMUCOSAL
  Filled 2016-04-19: qty 177

## 2016-04-19 MED ORDER — METHADONE HCL 10 MG PO TABS
60.0000 mg | ORAL_TABLET | Freq: Every day | ORAL | Status: DC
  Administered 2016-04-20 – 2016-04-23 (×4): 60 mg via ORAL
  Filled 2016-04-19 (×4): qty 6

## 2016-04-19 NOTE — Care Management Important Message (Signed)
Important Message  Patient Details  Name: [name] MRN: [id number] Date of Birth: 11-07-1950   Medicare Important Message Given:  Yes    [name] [address] 04/19/2016, 2:44 PM

## 2016-04-19 NOTE — Progress Notes (Signed)
[place] for Heparin --> Xarelto Indication: Venous sinus thrombosis   Allergies  Allergen Reactions  . Codeine Nausea And Vomiting  . Nsaids Other (See Comments)    Stomach ulcers   Patient Measurements: Height: 5\' 4"  (162.6 cm) Weight: 127 lb (57.6 kg) IBW/kg (Calculated) : 54.7  Vital Signs: Temp: 98.7 F (37.1 C) (02/09 0949) Temp Source: Oral (02/09 0949) BP: 142/85 (02/09 0949) Pulse Rate: 107 (02/09 0949)  Labs:  Recent Labs  04/17/16 0351  04/18/16 0349 04/19/16 0246 04/19/16 1151  HGB 9.2*  --  9.5* 9.9*  --   HCT 26.3*  --  28.1* 29.7*  --   PLT 422*  --  433* 453*  --   HEPARINUNFRC 0.73*  < > 0.48 0.28* 0.31  CREATININE 0.63  --  0.59 0.68  --   < > = values in this interval not displayed.  Estimated Creatinine Clearance: 61.3 mL/min (by C-G formula based on SCr of 0.68 mg/dL).  Assessment: 66 y/o Female with sinus thrombosis to transition to Xarelto for now  Goal of Therapy:   Monitor platelets by anticoagulation protocol: Yes   Plan:  DC heparin Xarelto 15 mg po BID for 3 weeks  Thank you Anette Guarneri, PharmD [phone]   04/19/2016 1:17 PM

## 2016-04-19 NOTE — Progress Notes (Signed)
Rehab Admissions Coordinator Note:  Patient was screened by Cleatrice Burke for appropriateness for an Inpatient Acute Rehab Consult per PT recommendation.  At this time, we are recommending Inpatient Rehab consult.  Cleatrice Burke 04/19/2016, 2:28 PM  I can be reached at [phone].

## 2016-04-19 NOTE — Progress Notes (Signed)
PROGRESS NOTE    [name]  [id number] DOB: Jun 25, 1950 DOA: 04/12/2016 PCP: Evette Doffing, MD   Outpatient Specialists:     Brief Narrative:  [name] is a 66 y.o. female with PMH as outlined below. She presented to [facility] ED 02/02 with acute onset left sided weakness and dysphasia that started between midnight and 1am.  Per her husband, pt has chronic neck pain but this had gotten worse over the past few days.  They saw PCP who prescribed her a prednisone pack and flexeril (she is chronically on methadone and valium; though valium is not listed on outpatient med list).  Pt and husband then went to sleep just before midnight and husband then woke up around 1am and found her to have above symptoms.  En route to ED, she had projectile vomiting.  In ED, she had fever (Tmax 102.7), tachycardia with rates in 150's, altered mental status, dysphasia, left sided weakness.  EMS informed EDP that there was concern for prescription drug abuse since pt had bottles of hydrocodone recently filled 4 days ago but only had 2 pills left, as well as diazepam bottle that was empty.  CT head was obtained and was normal.  She was evaluated by neurology who felt that she was a tPA candidate, she received this at 0435.  She was also started on empiric coverage for meningitis.  She had no improvement after tPA; therefore, PCCM was consulted for assistance.   Assessment & Plan:   Active Problems:   Cerebral embolism with cerebral infarction   Stroke (cerebrum) (HCC)   Meningitis   Acute respiratory failure (HCC)   Streptococcus pneumoniae meningitis   Streptococcal bacteremia   Septic thrombophlebitis of sagittal sinus   pneumococcal bacteremia and meningitis with suppurative sagittal sinus thrombosis -TEE negative -repeat blood culture NGTD -seen by ID- ceftriaxone 2 gram q 12 hours for minimum of 4 weeks from 2/3 -PICC line place -question of whether heparin/anticoagulation is needed-- currently on  heparin gtt- spoke with ID who suggested oncology weigh in: Dr. Elnoria Howard to see-- suggest xarelto for 6 months at this point but will see later -repeat MRI in 4 weeks from more recent  Chronic pain with h/o abuse -patient on methadone at home  Tobacco abuse -encourage cessation  Hypokalemia Replete   DVT prophylaxis:  Fully anticoagulated   Code Status: Full Code      Consultants:   Oncology  PCCM  ID     Subjective: C/o headache  Objective: Vitals:   04/18/16 2130 04/19/16 0053 04/19/16 0630 04/19/16 0949  BP: (!) 184/85 128/64 137/67 (!) 142/85  Pulse: (!) 102 99 99 (!) 107  Resp: 18 16 16 18   Temp: 97.5 F (36.4 C) 99.5 F (37.5 C) 99 F (37.2 C) 98.7 F (37.1 C)  TempSrc: Oral Oral Oral Oral  SpO2: 98% 95% 95% 100%  Weight:      Height:        Intake/Output Summary (Last 24 hours) at 04/19/16 1220 Last data filed at 04/18/16 2132  Gross per 24 hour  Intake              162 ml  Output              625 ml  Net             -463 ml   Filed Weights   04/12/16 0300 04/12/16 0448  Weight: 57.6 kg (127 lb) 57.6 kg (127 lb)    Examination:  General exam: ill appearing Respiratory system: Clear to auscultation. Respiratory effort normal. Cardiovascular system: S1 & S2 heard, RRR. No JVD, murmurs, rubs, gallops or clicks. No pedal edema. Gastrointestinal system: Abdomen is nondistended, soft and nontender. No organomegaly or masses felt. Normal bowel sounds heard. Central nervous system: Alert and oriented. Poor insight     Data Reviewed: I have personally reviewed following labs and imaging studies  CBC:  Recent Labs Lab 04/15/16 0518 04/16/16 0311 04/17/16 0351 04/18/16 0349 04/19/16 0246  WBC 13.5* 11.6* 13.0* 24.7* 16.1*  HGB 8.9* 9.0* 9.2* 9.5* 9.9*  HCT 25.3* 25.8* 26.3* 28.1* 29.7*  MCV 93.4 93.1 93.3 93.0 94.0  PLT 338 388 422* 433* 0000000*   Basic Metabolic Panel:  Recent Labs Lab 04/13/16 0234 04/14/16 0309  04/15/16 0518 04/16/16 0311 04/17/16 0351 04/18/16 0349 04/19/16 0246  NA 141 141 142 144 140 137 137  K 4.3 4.2 3.4* 3.5 3.7 3.4* 3.1*  CL 106 108 111 110 106 104 102  CO2 26 21* 24 22 21* 24 25  GLUCOSE 108* 107* 160* 153* 154* 110* 91  BUN 17 22* 33* 33* 25* 21* 11  CREATININE 0.78 0.72 0.68 0.73 0.63 0.59 0.68  CALCIUM 7.8* 8.2* 8.2* 8.4* 8.3* 8.4* 8.0*  MG 1.6* 1.8  --   --   --  2.0  --   PHOS 2.9 2.9  --   --   --  2.6  --    GFR: Estimated Creatinine Clearance: 61.3 mL/min (by C-G formula based on SCr of 0.68 mg/dL). Liver Function Tests: No results for input(s): AST, ALT, ALKPHOS, BILITOT, PROT, ALBUMIN in the last 168 hours. No results for input(s): LIPASE, AMYLASE in the last 168 hours. No results for input(s): AMMONIA in the last 168 hours. Coagulation Profile:  Recent Labs Lab 04/14/16 0309  INR 1.05   Cardiac Enzymes: No results for input(s): CKTOTAL, CKMB, CKMBINDEX, TROPONINI in the last 168 hours. BNP (last 3 results) No results for input(s): PROBNP in the last 8760 hours. HbA1C: No results for input(s): HGBA1C in the last 72 hours. CBG:  Recent Labs Lab 04/13/16 1559  GLUCAP 111*   Lipid Profile: No results for input(s): CHOL, HDL, LDLCALC, TRIG, CHOLHDL, LDLDIRECT in the last 72 hours. Thyroid Function Tests: No results for input(s): TSH, T4TOTAL, FREET4, T3FREE, THYROIDAB in the last 72 hours. Anemia Panel: No results for input(s): VITAMINB12, FOLATE, FERRITIN, TIBC, IRON, RETICCTPCT in the last 72 hours. Urine analysis:    Component Value Date/Time   COLORURINE YELLOW 04/12/2016 0456   APPEARANCEUR CLEAR 04/12/2016 0456   LABSPEC 1.019 04/12/2016 0456   PHURINE 5.0 04/12/2016 0456   GLUCOSEU NEGATIVE 04/12/2016 0456   HGBUR SMALL (A) 04/12/2016 0456   BILIRUBINUR NEGATIVE 04/12/2016 0456   KETONESUR NEGATIVE 04/12/2016 0456   PROTEINUR NEGATIVE 04/12/2016 0456   UROBILINOGEN 0.2 05/26/2013 1730   NITRITE NEGATIVE 04/12/2016 0456    LEUKOCYTESUR NEGATIVE 04/12/2016 0456   Sepsis Labs: @LABRCNTIP (procalcitonin:4,lacticidven:4)  ) Recent Results (from the past 240 hour(s))  Blood Culture (routine x 2)     Status: Abnormal   Collection Time: 04/12/16  4:05 AM  Result Value Ref Range Status   Specimen Description BLOOD RIGHT HAND  Final   Special Requests IN PEDIATRIC BOTTLE 3ML  Final   Culture  Setup Time   Final    GRAM POSITIVE COCCI IN PAIRS IN PEDIATRIC BOTTLE CRITICAL RESULT CALLED TO, READ BACK BY AND VERIFIED WITH: M MACCIA,PHARMD AT 1610 04/12/16 BY L  BENFIELD    Culture (A)  Final    STREPTOCOCCUS PNEUMONIAE SUSCEPTIBILITIES PERFORMED ON PREVIOUS CULTURE WITHIN THE LAST 5 DAYS.    Report Status 04/14/2016 FINAL  Final  Blood Culture (routine x 2)     Status: Abnormal   Collection Time: 04/12/16  4:15 AM  Result Value Ref Range Status   Specimen Description BLOOD LEFT FOREARM  Final   Special Requests BOTTLES DRAWN AEROBIC ONLY 5ML  Final   Culture  Setup Time   Final    GRAM POSITIVE COCCI IN CHAINS AEROBIC BOTTLE ONLY CRITICAL RESULT CALLED TO, READ BACK BY AND VERIFIED WITH: M MACCIA,PHARMD AT 1610 04/12/16 BY L BENFIELD    Culture STREPTOCOCCUS PNEUMONIAE (A)  Final   Report Status 04/14/2016 FINAL  Final   Organism ID, Bacteria STREPTOCOCCUS PNEUMONIAE  Final      Susceptibility   Streptococcus pneumoniae - MIC*    ERYTHROMYCIN <=0.12 SENSITIVE Sensitive     LEVOFLOXACIN 0.5 SENSITIVE Sensitive     PENICILLIN <=0.06 SENSITIVE Sensitive     CEFTRIAXONE <=0.12 SENSITIVE Sensitive     * STREPTOCOCCUS PNEUMONIAE  Blood Culture ID Panel (Reflexed)     Status: Abnormal   Collection Time: 04/12/16  4:15 AM  Result Value Ref Range Status   Enterococcus species NOT DETECTED NOT DETECTED Final   Listeria monocytogenes NOT DETECTED NOT DETECTED Final   Staphylococcus species NOT DETECTED NOT DETECTED Final   Staphylococcus aureus NOT DETECTED NOT DETECTED Final   Streptococcus species DETECTED (A)  NOT DETECTED Final    Comment: CRITICAL RESULT CALLED TO, READ BACK BY AND VERIFIED WITH: M MACCIA,PHARMD AT 1610 04/12/16 BY L BENFIELD    Streptococcus agalactiae NOT DETECTED NOT DETECTED Final   Streptococcus pneumoniae DETECTED (A) NOT DETECTED Final    Comment: CRITICAL RESULT CALLED TO, READ BACK BY AND VERIFIED WITH: M MACCIA,PHARMD AT 1610 04/12/16 BY L BENFIELD    Streptococcus pyogenes NOT DETECTED NOT DETECTED Final   Acinetobacter baumannii NOT DETECTED NOT DETECTED Final   Enterobacteriaceae species NOT DETECTED NOT DETECTED Final   Enterobacter cloacae complex NOT DETECTED NOT DETECTED Final   Escherichia coli NOT DETECTED NOT DETECTED Final   Klebsiella oxytoca NOT DETECTED NOT DETECTED Final   Klebsiella pneumoniae NOT DETECTED NOT DETECTED Final   Proteus species NOT DETECTED NOT DETECTED Final   Serratia marcescens NOT DETECTED NOT DETECTED Final   Haemophilus influenzae NOT DETECTED NOT DETECTED Final   Neisseria meningitidis NOT DETECTED NOT DETECTED Final   Pseudomonas aeruginosa NOT DETECTED NOT DETECTED Final   Candida albicans NOT DETECTED NOT DETECTED Final   Candida glabrata NOT DETECTED NOT DETECTED Final   Candida krusei NOT DETECTED NOT DETECTED Final   Candida parapsilosis NOT DETECTED NOT DETECTED Final   Candida tropicalis NOT DETECTED NOT DETECTED Final  Urine culture     Status: None   Collection Time: 04/12/16  4:56 AM  Result Value Ref Range Status   Specimen Description URINE, RANDOM  Final   Special Requests NONE  Final   Culture NO GROWTH  Final   Report Status 04/13/2016 FINAL  Final  MRSA PCR Screening     Status: None   Collection Time: 04/12/16  6:51 PM  Result Value Ref Range Status   MRSA by PCR NEGATIVE NEGATIVE Final    Comment:        The GeneXpert MRSA Assay (FDA approved for NASAL specimens only), is one component of a comprehensive MRSA colonization surveillance program. It is  not intended to diagnose MRSA infection nor to  guide or monitor treatment for MRSA infections.   Culture, blood (Routine X 2) w Reflex to ID Panel     Status: None   Collection Time: 04/13/16 12:35 PM  Result Value Ref Range Status   Specimen Description BLOOD LEFT HAND  Final   Special Requests IN PEDIATRIC BOTTLE 3CC  Final   Culture NO GROWTH 5 DAYS  Final   Report Status 04/18/2016 FINAL  Final      Anti-infectives    Start     Dose/Rate Route Frequency Ordered Stop   04/15/16 1400  fluconazole (DIFLUCAN) IVPB 100 mg     100 mg 50 mL/hr over 60 Minutes Intravenous Every 24 hours 04/15/16 1200 04/17/16 1558   04/12/16 1800  cefTRIAXone (ROCEPHIN) 2 g in dextrose 5 % 50 mL IVPB     2 g 100 mL/hr over 30 Minutes Intravenous Every 12 hours 04/12/16 0514     04/12/16 1700  acyclovir (ZOVIRAX) 550 mg in dextrose 5 % 100 mL IVPB  Status:  Discontinued     550 mg 111 mL/hr over 60 Minutes Intravenous Every 12 hours 04/12/16 0514 04/13/16 1126   04/12/16 1600  vancomycin (VANCOCIN) 500 mg in sodium chloride 0.9 % 100 mL IVPB  Status:  Discontinued     500 mg 100 mL/hr over 60 Minutes Intravenous Every 12 hours 04/12/16 0514 04/14/16 1158   04/12/16 0800  ampicillin (OMNIPEN) 2 g in sodium chloride 0.9 % 50 mL IVPB  Status:  Discontinued     2 g 150 mL/hr over 20 Minutes Intravenous Every 4 hours 04/12/16 0514 04/13/16 1126   04/12/16 0515  acyclovir (ZOVIRAX) 550 mg in dextrose 5 % 100 mL IVPB     550 mg 111 mL/hr over 60 Minutes Intravenous  Once 04/12/16 0514 04/12/16 0800   04/12/16 0400  cefTRIAXone (ROCEPHIN) 2 g in dextrose 5 % 50 mL IVPB     2 g 100 mL/hr over 30 Minutes Intravenous  Once 04/12/16 0357 04/12/16 0658   04/12/16 0400  vancomycin (VANCOCIN) IVPB 1000 mg/200 mL premix     1,000 mg 200 mL/hr over 60 Minutes Intravenous  Once 04/12/16 0357 04/12/16 0536   04/12/16 0400  ampicillin (OMNIPEN) 2 g in sodium chloride 0.9 % 50 mL IVPB     2 g 150 mL/hr over 20 Minutes Intravenous  Once 04/12/16 0357 04/12/16 [id number]        Radiology Studies: No results found.      Scheduled Meds: .  stroke: mapping our early stages of recovery book   Does not apply Once  . cefTRIAXone (ROCEPHIN)  IV  2 g Intravenous Q12H  . feeding supplement (ENSURE ENLIVE)  237 mL Oral BID BM  . levETIRAcetam  500 mg Oral Q12H  . [START ON 04/20/2016] methadone  60 mg Oral Daily  . pantoprazole  40 mg Oral QHS  . pneumococcal 23 valent vaccine  0.5 mL Intramuscular Tomorrow-1000   Continuous Infusions: . sodium chloride 30 mL/hr at 04/18/16 1600  . heparin 1,100 Units/hr (04/19/16 0339)     LOS: 7 days    Time spent: 35 min    [place], DO [facility] Pager [phone]  If 7PM-7AM, please contact night-coverage [email or web address] Password [email or web address] 04/19/2016, 12:20 PM

## 2016-04-19 NOTE — Progress Notes (Signed)
[facility] for Heparin  Indication: Venous sinus thrombosis   Allergies  Allergen Reactions  . Codeine Nausea And Vomiting  . Nsaids Other (See Comments)    Stomach ulcers   Patient Measurements: Height: 5\' 4"  (162.6 cm) Weight: 127 lb (57.6 kg) IBW/kg (Calculated) : 54.7  Vital Signs: Temp: 99.5 F (37.5 C) (02/09 0053) Temp Source: Oral (02/09 0053) BP: 128/64 (02/09 0053) Pulse Rate: 99 (02/09 0053)  Labs:  Recent Labs  04/17/16 0351  04/17/16 2245 04/18/16 0349 04/19/16 0246  HGB 9.2*  --   --  9.5* 9.9*  HCT 26.3*  --   --  28.1* 29.7*  PLT 422*  --   --  433* 453*  HEPARINUNFRC 0.73*  < > 0.40 0.48 0.28*  CREATININE 0.63  --   --  0.59 0.68  < > = values in this interval not displayed.  Estimated Creatinine Clearance: 61.3 mL/min (by C-G formula based on SCr of 0.68 mg/dL).  Assessment: 66 y/o Female with sinus thrombosis continues on IV heparin. Heparin level now down to slightly subtherapeutic (0.28) on gtt at 1050 units/hr. No issues with line or bleeding reported per RN.  Goal of Therapy:  Heparin level 0.3-0.5 units/ml Monitor platelets by anticoagulation protocol: Yes   Plan:  Increase heparin to 1150 units/hr F/u 6 hr heparin level  Sherlon Handing, PharmD, BCPS Clinical pharmacist, pager [phone]  04/19/2016 3:37 AM

## 2016-04-19 NOTE — Evaluation (Addendum)
Physical Therapy Evaluation Patient Details Name: [name] MRN: [id number] DOB: [birth year] Today's Date: 04/19/2016   History of Present Illness  66 year old female admitted with L sided weakness. Dx of strep pneumonia meningitis and septic superior sagittal venous sinus thrombosis, VDRF 2/5-04/17/16.  PMH: chronic neck pain, L eye paralysis, fibromyalgia.  Clinical Impression  Pt admitted with above diagnosis. Pt currently with functional limitations due to the deficits listed below (see PT Problem List). Mod assist for bed to recliner transfer. Pt has decreased strength/ROM LUE and LLE, poor sitting and standing balance, LLE and head pain which limit activity tolerance. CIR consult placed.  Pt will benefit from skilled PT to increase their independence and safety with mobility to allow discharge to the venue listed below.       Follow Up Recommendations CIR;Other (comment) (assistance for mobility)    Equipment Recommendations  Rolling walker with 5" wheels    Recommendations for Other Services       Precautions / Restrictions Precautions Precautions: Fall Restrictions Weight Bearing Restrictions: No      Mobility  Bed Mobility Overal bed mobility: Needs Assistance Bed Mobility: Rolling;Sidelying to Sit Rolling: Min assist Sidelying to sit: Mod assist       General bed mobility comments: assist to initiate roll then to raise trunk, increased time, pt reports headache at rest and LLE pain with movement  Transfers Overall transfer level: Needs assistance Equipment used: Rolling walker (2 wheeled) Transfers: Sit to/from [facility] Sit to Stand: Mod assist Stand pivot transfers: Mod assist       General transfer comment: mod A to rise, VCs hand placement, mod A to pivot, Mod A for balance 2* posterior lean, flexed trunk posture in standing, activity tolerance limited by LLE and head pain  Ambulation/Gait             General Gait Details: NT- 2*  pain  Stairs            Wheelchair Mobility    Modified Rankin (Stroke Patients Only)       Balance Overall balance assessment: Needs assistance Sitting-balance support: Feet supported;Bilateral upper extremity supported Sitting balance-Leahy Scale: Poor Sitting balance - Comments: leans left requiring min A to correct Postural control: Left lateral lean Standing balance support: Bilateral upper extremity supported Standing balance-Leahy Scale: Poor Standing balance comment: relies on BUE support                             Pertinent Vitals/Pain Pain Assessment: 0-10 Pain Score: 10-Worst pain ever Pain Location: LLE and head Pain Descriptors / Indicators: Sore Pain Intervention(s): Monitored during session;Limited activity within patient's tolerance;Premedicated before session;Patient requesting pain meds-RN notified    Home Living Family/patient expects to be discharged to:: Private residence Living Arrangements: Spouse/significant other Available Help at Discharge: Available PRN/intermittently   Home Access: Stairs to enter Entrance Stairs-Rails: None Entrance Stairs-Number of Steps: 4 Home Layout: One level Home Equipment: None Additional Comments: husband works days    Prior Function Level of Independence: Independent               Journalist, newspaper        Extremity/Trunk Assessment   Upper Extremity Assessment Upper Extremity Assessment: LUE deficits/detail LUE Deficits / Details: shoulder elevation AAROM 30* limited by pain, grip +2/5 LUE: Unable to fully assess due to pain    Lower Extremity Assessment Lower Extremity Assessment: LLE deficits/detail LLE Deficits / Details: ankle DF  AROM -15* limited by pain, knee ext AROM -25* limited by pain LLE: Unable to fully assess due to pain LLE Sensation: decreased light touch       Communication   Communication: No difficulties  Cognition Arousal/Alertness: Awake/alert Behavior  During Therapy: WFL for tasks assessed/performed Overall Cognitive Status: Within Functional Limits for tasks assessed                      General Comments      Exercises     Assessment/Plan    PT Assessment Patient needs continued PT services  PT Problem List Decreased strength;Decreased mobility;Decreased range of motion;Decreased activity tolerance;Decreased balance;Decreased knowledge of use of DME;Impaired sensation;Pain          PT Treatment Interventions DME instruction;Gait training;Functional mobility training;Balance training;Therapeutic exercise;Therapeutic activities;Patient/family education    PT Goals (Current goals can be found in the Care Plan section)  Acute Rehab PT Goals Patient Stated Goal: to be able to care for her home and 2 chihuahuas PT Goal Formulation: With patient Time For Goal Achievement: 05/03/16 Potential to Achieve Goals: Good    Frequency Min 3X/week   Barriers to discharge Decreased caregiver support husband works days    Co-evaluation               End of Session Equipment Utilized During Treatment: Gait belt Activity Tolerance: Patient limited by pain;Patient limited by fatigue Patient left: in chair;with call bell/phone within reach;with chair alarm set Nurse Communication: Mobility status         Time: [id number] PT Time Calculation (min) (ACUTE ONLY): 23 min   Charges:   PT Evaluation $PT Eval Moderate Complexity: 1 Procedure PT Treatments $Therapeutic Activity: 8-22 mins   PT G Codes:        Philomena Doheny 04/19/2016, 11:25 AM [phone]

## 2016-04-19 NOTE — Consult Note (Signed)
Referral MD  Reason for Referral: Superior sagittal sinus thrombosis-pneumococcal meningitis   Chief Complaint  Patient presents with  . Altered Mental Status  : I have a bad headache.  HPI: [name] is a very charming 66 year old white female. She's been in the hospital for about a week or so. She has streptococcal bacteremia with subsequent meningitis. She has resolving left hemiplegia. She has left orbital paralysis. She says that she has not been able to move her left eye for a long time. His heart is much of this is real.  When she came into the emergency room, she was given TPA. She has left hemiplegia. This tested to be improving.  She had bad headaches. An MRI was done which showed a 4.5% segment of thrombus in the superior sagittal sinus.  There is no history of blood clots in the family. She says 2 brothers and a sister have "tumors".  She has a placed on anticoagulation with heparin. She still is complaining of some headaches.  She had a TEE done. This did not show any vegetation.  She does have some anemia. I wish she was admitted she was not anemic. She was intubated at one point.  She has normal renal function.  She is very nice. She is lucid. She does have some weakness in the left arm and really cannot move the left leg.  She is on heparin right now. She's had no bleeding.  She is eating a little bit.  She is on IV antibiotics with Rocephin. She has a PICC line in. It looks actually needs 6 weeks of antibiotics. She has a lot of bruising.    Past Medical History:  Diagnosis Date  . Arrhythmia   . Arthritis   . Fibromyalgia   . GERD (gastroesophageal reflux disease)   . H/O: substance abuse 2005   narcotic usage due to chronic back pain  . Heart murmur   . Hypertension   . PONV (postoperative nausea and vomiting)   :  Past Surgical History:  Procedure Laterality Date  . ABDOMINAL HYSTERECTOMY    . ABDOMINAL SURGERY    . APPENDECTOMY    . BREAST  SURGERY    . CHOLECYSTECTOMY N/A 09/30/2012   Procedure: LAPAROSCOPIC CHOLECYSTECTOMY WITH INTRAOPERATIVE CHOLANGIOGRAM;  Surgeon: Imogene Burn. Georgette Dover, MD;  Location: [facility];  Service: General;  Laterality: N/A;  . FRACTURE SURGERY     right upper arm  . OVARIAN CYST SURGERY    :   Current Facility-Administered Medications:  .   stroke: mapping our early stages of recovery book, , Does not apply, Once, Kerney Elbe, MD, Stopped at 04/12/16 1147 .  0.9 %  sodium chloride infusion, , Intravenous, Continuous, Collene Gobble, MD, Last Rate: 30 mL/hr at 04/18/16 1600 .  acetaminophen (TYLENOL) tablet 650 mg, 650 mg, Oral, Q4H PRN, 650 mg at 04/19/16 1825 **OR** acetaminophen (TYLENOL) solution 650 mg, 650 mg, Per Tube, Q4H PRN **OR** acetaminophen (TYLENOL) suppository 650 mg, 650 mg, Rectal, Q4H PRN, Kerney Elbe, MD .  bisacodyl (DULCOLAX) suppository 10 mg, 10 mg, Rectal, Daily PRN, Collene Gobble, MD .  cefTRIAXone (ROCEPHIN) 2 g in dextrose 5 % 50 mL IVPB, 2 g, Intravenous, Q12H, Veronda P Bryk, RPH, 2 g at 04/19/16 1800 .  docusate (COLACE) 50 MG/5ML liquid 100 mg, 100 mg, Per Tube, BID PRN, Collene Gobble, MD .  feeding supplement (ENSURE ENLIVE) (ENSURE ENLIVE) liquid 237 mL, 237 mL, Oral, BID BM, Collene Gobble, MD .  fentaNYL (SUBLIMAZE) injection 12.5-25 mcg, 12.5-25 mcg, Intravenous, Q4H PRN, Omar Person, NP, 25 mcg at 04/19/16 1414 .  HYDROcodone-acetaminophen (NORCO) 7.5-325 MG per tablet 1 tablet, 1 tablet, Oral, Q6H PRN, Omar Person, NP, 1 tablet at 04/18/16 2112 .  levETIRAcetam (KEPPRA) tablet 500 mg, 500 mg, Oral, Q12H, Geradine Girt, DO, 500 mg at 04/19/16 1825 .  [START ON 04/20/2016] methadone (DOLOPHINE) tablet 60 mg, 60 mg, Oral, Daily, Jessica U Vann, DO .  ondansetron (ZOFRAN) injection 4 mg, 4 mg, Intravenous, Q6H PRN, Rahul P Desai, PA-C .  pantoprazole (PROTONIX) EC tablet 40 mg, 40 mg, Oral, QHS, Jessica U Vann, DO .  phenol (CHLORASEPTIC) mouth spray 1  spray, 1 spray, Mouth/Throat, PRN, Geradine Girt, DO, 1 spray at 04/19/16 1800 .  Rivaroxaban (XARELTO) tablet 15 mg, 15 mg, Oral, BID WC, Jessica U Vann, DO, 15 mg at 04/19/16 1428 .  senna-docusate (Senokot-S) tablet 1 tablet, 1 tablet, Oral, QHS PRN, Kerney Elbe, MD:  .  stroke: mapping our early stages of recovery book   Does not apply Once  . cefTRIAXone (ROCEPHIN)  IV  2 g Intravenous Q12H  . feeding supplement (ENSURE ENLIVE)  237 mL Oral BID BM  . levETIRAcetam  500 mg Oral Q12H  . [START ON 04/20/2016] methadone  60 mg Oral Daily  . pantoprazole  40 mg Oral QHS  . Rivaroxaban  15 mg Oral BID WC  :  Allergies  Allergen Reactions  . Codeine Nausea And Vomiting  . Nsaids Other (See Comments)    Stomach ulcers  :  Family History  Problem Relation Age of Onset  . Alzheimer's disease Mother   . Cancer Father     prostate  . Heart disease Father   :  Social History   Social History  . Marital status: Married    Spouse name: N/A  . Number of children: N/A  . Years of education: N/A   Occupational History  . Not on file.   Social History Main Topics  . Smoking status: Current Every Day Smoker    Packs/day: 1.00    Years: 30.00    Types: Cigarettes  . Smokeless tobacco: Never Used  . Alcohol use No  . Drug use: No     Comment: Methadone for narcotic use  . Sexual activity: Not on file   Other Topics Concern  . Not on file   Social History Narrative  . No narrative on file  :  Pertinent items are noted in HPI.  Exam: Patient Vitals for the past 24 hrs:  BP Temp Temp src Pulse Resp SpO2  04/19/16 1748 (!) 143/67 99.9 F (37.7 C) Oral (!) 111 18 98 %  04/19/16 1403 (!) 147/61 99.4 F (37.4 C) Oral (!) 105 18 99 %  04/19/16 0949 (!) 142/85 98.7 F (37.1 C) Oral (!) 107 18 100 %  04/19/16 0630 137/67 99 F (37.2 C) Oral 99 16 95 %  04/19/16 0053 128/64 99.5 F (37.5 C) Oral 99 16 95 %  04/18/16 2130 (!) 184/85 97.5 F (36.4 C) Oral (!) 102 18 98 %     As above    Recent Labs  04/18/16 0349 04/19/16 0246  WBC 24.7* 16.1*  HGB 9.5* 9.9*  HCT 28.1* 29.7*  PLT 433* 453*    Recent Labs  04/18/16 0349 04/19/16 0246  NA 137 137  K 3.4* 3.1*  CL 104 102  CO2 24 25  GLUCOSE 110* 91  BUN 21* 11  CREATININE 0.59 0.68  CALCIUM 8.4* 8.0*    Blood smear review:  None  Pathology: None     Assessment and Plan:  [name] is a 66 year old white female. She has pneumococcal meningitis. She has pneumococcal bacteremia.  I would think that the superior sagittal sinus thrombosis is reflective of this infection. This is very focal. I do not think that  she has any type of hypercoagulable state. She is a smoker. She is not on any estrogens. She is not diabetic. She does not have any obvious endocarditis.  I think that heparin is appropriate right now. I would probably transition her over to [place]. This is daily. I would think that this should be adequate. I think she will be reliable and take this.  I agree that 3 months of anticoagulation would be reasonable.  Hopefully, she will be oh to regain some function overall her left side.  We had a very good prayer session. She certainly has a strong faith.  We will follow along and try to help out in any way that we can.  Lattie Haw, MD  Vonna Kotyk 1:9

## 2016-04-20 DIAGNOSIS — G894 Chronic pain syndrome: Secondary | ICD-10-CM

## 2016-04-20 DIAGNOSIS — Z720 Tobacco use: Secondary | ICD-10-CM

## 2016-04-20 DIAGNOSIS — I10 Essential (primary) hypertension: Secondary | ICD-10-CM

## 2016-04-20 DIAGNOSIS — R000 Tachycardia, unspecified: Secondary | ICD-10-CM

## 2016-04-20 DIAGNOSIS — D72829 Elevated white blood cell count, unspecified: Secondary | ICD-10-CM

## 2016-04-20 DIAGNOSIS — M797 Fibromyalgia: Secondary | ICD-10-CM

## 2016-04-20 DIAGNOSIS — R509 Fever, unspecified: Secondary | ICD-10-CM

## 2016-04-20 DIAGNOSIS — F1910 Other psychoactive substance abuse, uncomplicated: Secondary | ICD-10-CM

## 2016-04-20 DIAGNOSIS — M159 Polyosteoarthritis, unspecified: Secondary | ICD-10-CM

## 2016-04-20 DIAGNOSIS — G8194 Hemiplegia, unspecified affecting left nondominant side: Secondary | ICD-10-CM

## 2016-04-20 DIAGNOSIS — I6310 Cerebral infarction due to embolism of unspecified precerebral artery: Secondary | ICD-10-CM

## 2016-04-20 DIAGNOSIS — D62 Acute posthemorrhagic anemia: Secondary | ICD-10-CM

## 2016-04-20 LAB — BASIC METABOLIC PANEL
Anion gap: 12 (ref 5–15)
BUN: 5 mg/dL — ABNORMAL LOW (ref 6–20)
CO2: 26 mmol/L (ref 22–32)
Calcium: 8.3 mg/dL — ABNORMAL LOW (ref 8.9–10.3)
Chloride: 98 mmol/L — ABNORMAL LOW (ref 101–111)
Creatinine, Ser: 0.62 mg/dL (ref 0.44–1.00)
GFR calc Af Amer: >60 mL/min (ref >60)
GFR calc non Af Amer: >60 mL/min (ref >60)
Glucose, Bld: 92 mg/dL (ref 65–99)
Potassium: 3.4 mmol/L — ABNORMAL LOW (ref 3.5–5.1)
Sodium: 136 mmol/L (ref 135–145)

## 2016-04-20 NOTE — Progress Notes (Signed)
Patient in room with family at her bedside. Denies any needs at this time. Will continue to monitor.

## 2016-04-20 NOTE — Progress Notes (Signed)
PROGRESS NOTE    [name]  NWG:[id number] DOB: 14-Nov-1950 DOA: 04/12/2016 PCP: Hilton Sinclair, MD   Outpatient Specialists:     Brief Narrative:  [name] is a 66 y.o. female with PMH as outlined below. She presented to [facility] ED 02/02 with acute onset left sided weakness and dysphasia that started between midnight and 1am.  Per her husband, pt has chronic neck pain but this had gotten worse over the past few days.  They saw PCP who prescribed her a prednisone pack and flexeril (she is chronically on methadone and valium; though valium is not listed on outpatient med list).  Pt and husband then went to sleep just before midnight and husband then woke up around 1am and found her to have above symptoms.  En route to ED, she had projectile vomiting.  In ED, she had fever (Tmax 102.7), tachycardia with rates in 150's, altered mental status, dysphasia, left sided weakness.  EMS informed EDP that there was concern for prescription drug abuse since pt had bottles of hydrocodone recently filled 4 days ago but only had 2 pills left, as well as diazepam bottle that was empty.  CT head was obtained and was normal.  She was evaluated by neurology who felt that she was a tPA candidate, she received this at 0435.  She was also started on empiric coverage for meningitis.  She had no improvement after tPA; therefore, PCCM was consulted for assistance.   Assessment & Plan:   Active Problems:   Cerebral embolism with cerebral infarction   Stroke (cerebrum) (HCC)   Meningitis   Acute respiratory failure (HCC)   Streptococcus pneumoniae meningitis   Streptococcal bacteremia   Septic thrombophlebitis of sagittal sinus   pneumococcal meningitis. She has pneumococcal bacteremia with suppurative sagittal sinus thrombosis Acute infectious encephalopathy/sagittal sinus thrombosis S/p tPA at 0435 on 04/12/16. Blood 02/02 > S pneumo > pan-sens streptococcal bacteremia with subsequent meningitis. She has  resolving left hemiplegia. She has left orbital paralysis. -TEE negative -repeat blood culture NGTD -seen by ID- patient has been on high-dose ceftriaxone 2 gram q 12 hours for minimum with 4 weeks of high dose ceftriaxone from date of first negative culture which was 04/13/16, per ID note from 04/17/16 -PICC line place She has a placed on anticoagulation with heparin, currently on heparin gtt- spoke with ID who suggested oncology weigh in: Dr. Rexene Edison to see--  oncology recommended Xarelto, 3 months of anticoagulation is reasonable -MRI was done which showed a 4.5% segment of thrombus in the superior sagittal sinus ID recommends 6 weeks high dose ceftriaxone, repeat MRI brain in about 1 month Discontinued steroids after 2/7 dose-DEXAMETHASONE THERAPY HAS BEEN DISCONTINUED  Acute respiratory failure due to poor airway protection / encephalopathy Tobacco dependence. Intubated 2/2 for airway protection. MRI and blood cx's show S pneumo meningitis. Extubated 2/7. Push pulm hygiene   Chronic pain with h/o abuse -patient on methadone at home  Tobacco abuse -encourage cessation  Hypokalemia Replete   DVT prophylaxis:  Fully anticoagulated   Code Status: Full Code      Consultants:   Oncology  PCCM  ID     Subjective: C/o headache. Still has low-grade fever of 100.4, patient is awake and oriented  Objective: Vitals:   04/19/16 2052 04/20/16 0138 04/20/16 0515 04/20/16 0928  BP: 128/63 (!) 126/50 132/67 (!) 123/56  Pulse: 97 99 (!) 106 (!) 117  Resp: 18 18 18 18   Temp: 99.1 F (37.3 C) 99.4 F (37.4 C)  98.5 F (36.9 C) (!) 100.4 F (38 C)  TempSrc: Oral Oral Oral Oral  SpO2: 97% 99% 99% 100%  Weight:      Height:        Intake/Output Summary (Last 24 hours) at 04/20/16 1009 Last data filed at 04/20/16 0900  Gross per 24 hour  Intake              710 ml  Output              800 ml  Net              -90 ml   Filed Weights   04/12/16 0300 04/12/16 0448    Weight: 57.6 kg (127 lb) 57.6 kg (127 lb)    Examination:  General exam: ill appearing, Awake and oriented Respiratory system: Clear to auscultation. Respiratory effort normal. Cardiovascular system: S1 & S2 heard, RRR. No JVD, murmurs, rubs, gallops or clicks. No pedal edema. Gastrointestinal system: Abdomen is nondistended, soft and nontender. No organomegaly or masses felt. Normal bowel sounds heard. Central nervous system: Alert and oriented. Poor insight     Data Reviewed: I have personally reviewed following labs and imaging studies  CBC:  Recent Labs Lab 04/15/16 0518 04/16/16 0311 04/17/16 0351 04/18/16 0349 04/19/16 0246  WBC 13.5* 11.6* 13.0* 24.7* 16.1*  HGB 8.9* 9.0* 9.2* 9.5* 9.9*  HCT 25.3* 25.8* 26.3* 28.1* 29.7*  MCV 93.4 93.1 93.3 93.0 94.0  PLT 338 388 422* 433* 453*   Basic Metabolic Panel:  Recent Labs Lab 04/14/16 0309  04/16/16 0311 04/17/16 0351 04/18/16 0349 04/19/16 0246 04/20/16 0530  NA 141  < > 144 140 137 137 136  K 4.2  < > 3.5 3.7 3.4* 3.1* 3.4*  CL 108  < > 110 106 104 102 98*  CO2 21*  < > 22 21* 24 25 26   GLUCOSE 107*  < > 153* 154* 110* 91 92  BUN 22*  < > 33* 25* 21* 11 5*  CREATININE 0.72  < > 0.73 0.63 0.59 0.68 0.62  CALCIUM 8.2*  < > 8.4* 8.3* 8.4* 8.0* 8.3*  MG 1.8  --   --   --  2.0  --   --   PHOS 2.9  --   --   --  2.6  --   --   < > = values in this interval not displayed. GFR: Estimated Creatinine Clearance: 61.3 mL/min (by C-G formula based on SCr of 0.62 mg/dL). Liver Function Tests: No results for input(s): AST, ALT, ALKPHOS, BILITOT, PROT, ALBUMIN in the last 168 hours. No results for input(s): LIPASE, AMYLASE in the last 168 hours. No results for input(s): AMMONIA in the last 168 hours. Coagulation Profile:  Recent Labs Lab 04/14/16 0309  INR 1.05   Cardiac Enzymes: No results for input(s): CKTOTAL, CKMB, CKMBINDEX, TROPONINI in the last 168 hours. BNP (last 3 results) No results for input(s):  PROBNP in the last 8760 hours. HbA1C: No results for input(s): HGBA1C in the last 72 hours. CBG:  Recent Labs Lab 04/13/16 1559  GLUCAP 111*   Lipid Profile: No results for input(s): CHOL, HDL, LDLCALC, TRIG, CHOLHDL, LDLDIRECT in the last 72 hours. Thyroid Function Tests: No results for input(s): TSH, T4TOTAL, FREET4, T3FREE, THYROIDAB in the last 72 hours. Anemia Panel: No results for input(s): VITAMINB12, FOLATE, FERRITIN, TIBC, IRON, RETICCTPCT in the last 72 hours. Urine analysis:    Component Value Date/Time   COLORURINE YELLOW 04/12/2016 0456  APPEARANCEUR CLEAR 04/12/2016 0456   LABSPEC 1.019 04/12/2016 0456   PHURINE 5.0 04/12/2016 0456   GLUCOSEU NEGATIVE 04/12/2016 0456   HGBUR SMALL (A) 04/12/2016 0456   BILIRUBINUR NEGATIVE 04/12/2016 0456   KETONESUR NEGATIVE 04/12/2016 0456   PROTEINUR NEGATIVE 04/12/2016 0456   UROBILINOGEN 0.2 05/26/2013 1730   NITRITE NEGATIVE 04/12/2016 0456   LEUKOCYTESUR NEGATIVE 04/12/2016 0456   Sepsis Labs: @LABRCNTIP (procalcitonin:4,lacticidven:4)  ) Recent Results (from the past 240 hour(s))  Blood Culture (routine x 2)     Status: Abnormal   Collection Time: 04/12/16  4:05 AM  Result Value Ref Range Status   Specimen Description BLOOD RIGHT HAND  Final   Special Requests IN PEDIATRIC BOTTLE  Final   Culture  Setup Time   Final    GRAM POSITIVE COCCI IN PAIRS IN PEDIATRIC BOTTLE CRITICAL RESULT CALLED TO, READ BACK BY AND VERIFIED WITH: M MACCIA,PHARMD AT 1610 04/12/16 BY L BENFIELD    Culture (A)  Final    STREPTOCOCCUS PNEUMONIAE SUSCEPTIBILITIES PERFORMED ON PREVIOUS CULTURE WITHIN THE LAST 5 DAYS.    Report Status 04/14/2016 FINAL  Final  Blood Culture (routine x 2)     Status: Abnormal   Collection Time: 04/12/16  4:15 AM  Result Value Ref Range Status   Specimen Description BLOOD LEFT FOREARM  Final   Special Requests BOTTLES DRAWN AEROBIC ONLY  Final   Culture  Setup Time   Final    GRAM POSITIVE COCCI  IN CHAINS AEROBIC BOTTLE ONLY CRITICAL RESULT CALLED TO, READ BACK BY AND VERIFIED WITH: M MACCIA,PHARMD AT 1610 04/12/16 BY L BENFIELD    Culture STREPTOCOCCUS PNEUMONIAE (A)  Final   Report Status 04/14/2016 FINAL  Final   Organism ID, Bacteria STREPTOCOCCUS PNEUMONIAE  Final      Susceptibility   Streptococcus pneumoniae - MIC*    ERYTHROMYCIN <=0.12 SENSITIVE Sensitive     LEVOFLOXACIN 0.5 SENSITIVE Sensitive     PENICILLIN <=0.06 SENSITIVE Sensitive     CEFTRIAXONE <=0.12 SENSITIVE Sensitive     * STREPTOCOCCUS PNEUMONIAE  Blood Culture ID Panel (Reflexed)     Status: Abnormal   Collection Time: 04/12/16  4:15 AM  Result Value Ref Range Status   Enterococcus species NOT DETECTED NOT DETECTED Final   Listeria monocytogenes NOT DETECTED NOT DETECTED Final   Staphylococcus species NOT DETECTED NOT DETECTED Final   Staphylococcus aureus NOT DETECTED NOT DETECTED Final   Streptococcus species DETECTED (A) NOT DETECTED Final    Comment: CRITICAL RESULT CALLED TO, READ BACK BY AND VERIFIED WITH: M MACCIA,PHARMD AT 1610 04/12/16 BY L BENFIELD    Streptococcus agalactiae NOT DETECTED NOT DETECTED Final   Streptococcus pneumoniae DETECTED (A) NOT DETECTED Final    Comment: CRITICAL RESULT CALLED TO, READ BACK BY AND VERIFIED WITH: M MACCIA,PHARMD AT 1610 04/12/16 BY L BENFIELD    Streptococcus pyogenes NOT DETECTED NOT DETECTED Final   Acinetobacter baumannii NOT DETECTED NOT DETECTED Final   Enterobacteriaceae species NOT DETECTED NOT DETECTED Final   Enterobacter cloacae complex NOT DETECTED NOT DETECTED Final   Escherichia coli NOT DETECTED NOT DETECTED Final   Klebsiella oxytoca NOT DETECTED NOT DETECTED Final   Klebsiella pneumoniae NOT DETECTED NOT DETECTED Final   Proteus species NOT DETECTED NOT DETECTED Final   Serratia marcescens NOT DETECTED NOT DETECTED Final   Haemophilus influenzae NOT DETECTED NOT DETECTED Final   Neisseria meningitidis NOT DETECTED NOT DETECTED Final    Pseudomonas aeruginosa NOT DETECTED NOT DETECTED Final  Candida albicans NOT DETECTED NOT DETECTED Final   Candida glabrata NOT DETECTED NOT DETECTED Final   Candida krusei NOT DETECTED NOT DETECTED Final   Candida parapsilosis NOT DETECTED NOT DETECTED Final   Candida tropicalis NOT DETECTED NOT DETECTED Final  Urine culture     Status: None   Collection Time: 04/12/16  4:56 AM  Result Value Ref Range Status   Specimen Description URINE, RANDOM  Final   Special Requests NONE  Final   Culture NO GROWTH  Final   Report Status 04/13/2016 FINAL  Final  MRSA PCR Screening     Status: None   Collection Time: 04/12/16  6:51 PM  Result Value Ref Range Status   MRSA by PCR NEGATIVE NEGATIVE Final    Comment:        The GeneXpert MRSA Assay (FDA approved for NASAL specimens only), is one component of a comprehensive MRSA colonization surveillance program. It is not intended to diagnose MRSA infection nor to guide or monitor treatment for MRSA infections.   Culture, blood (Routine X 2) w Reflex to ID Panel     Status: None   Collection Time: 04/13/16 12:35 PM  Result Value Ref Range Status   Specimen Description BLOOD LEFT HAND  Final   Special Requests IN PEDIATRIC BOTTLE 3CC  Final   Culture NO GROWTH 5 DAYS  Final   Report Status 04/18/2016 FINAL  Final      Anti-infectives    Start     Dose/Rate Route Frequency Ordered Stop   04/15/16 1400  fluconazole (DIFLUCAN) IVPB 100 mg     100 mg 50 mL/hr over 60 Minutes Intravenous Every 24 hours 04/15/16 1200 04/17/16 1558   04/12/16 1800  cefTRIAXone (ROCEPHIN) 2 g in dextrose 5 % 50 mL IVPB     2 g 100 mL/hr over 30 Minutes Intravenous Every 12 hours 04/12/16 0514     04/12/16 1700  acyclovir (ZOVIRAX) 550 mg in dextrose 5 % 100 mL IVPB  Status:  Discontinued     550 mg 111 mL/hr over 60 Minutes Intravenous Every 12 hours 04/12/16 0514 04/13/16 1126   04/12/16 1600  vancomycin (VANCOCIN) 500 mg in sodium chloride 0.9 % 100 mL  IVPB  Status:  Discontinued     500 mg 100 mL/hr over 60 Minutes Intravenous Every 12 hours 04/12/16 0514 04/14/16 1158   04/12/16 0800  ampicillin (OMNIPEN) 2 g in sodium chloride 0.9 % 50 mL IVPB  Status:  Discontinued     2 g 150 mL/hr over 20 Minutes Intravenous Every 4 hours 04/12/16 0514 04/13/16 1126   04/12/16 0515  acyclovir (ZOVIRAX) 550 mg in dextrose 5 % 100 mL IVPB     550 mg 111 mL/hr over 60 Minutes Intravenous  Once 04/12/16 0514 04/12/16 0800   04/12/16 0400  cefTRIAXone (ROCEPHIN) 2 g in dextrose 5 % 50 mL IVPB     2 g 100 mL/hr over 30 Minutes Intravenous  Once 04/12/16 0357 04/12/16 0658   04/12/16 0400  vancomycin (VANCOCIN) IVPB 1000 mg/200 mL premix     1,000 mg 200 mL/hr over 60 Minutes Intravenous  Once 04/12/16 0357 04/12/16 0536   04/12/16 0400  ampicillin (OMNIPEN) 2 g in sodium chloride 0.9 % 50 mL IVPB     2 g 150 mL/hr over 20 Minutes Intravenous  Once 04/12/16 0357 04/12/16 8295       Radiology Studies: No results found.      Scheduled Meds: .  stroke:  mapping our early stages of recovery book   Does not apply Once  . cefTRIAXone (ROCEPHIN)  IV  2 g Intravenous Q12H  . feeding supplement (ENSURE ENLIVE)  237 mL Oral BID BM  . levETIRAcetam  500 mg Oral Q12H  . methadone  60 mg Oral Daily  . pantoprazole  40 mg Oral QHS  . Rivaroxaban  15 mg Oral BID WC   Continuous Infusions: . sodium chloride 30 mL/hr at 04/18/16 1600     LOS: 8 days    Time spent: 35 min    Navon Kotowski, DO [facility] Pager [phone]  If 7PM-7AM, please contact night-coverage [email or web address] Password [email or web address] 04/20/2016, 10:09 AM

## 2016-04-20 NOTE — Consult Note (Signed)
Physical Medicine and Rehabilitation Consult Reason for Consult: Left sided weakness Referring Physician: Eulogio Bear, DO   HPI: [name] is a 66 y.o. female with pmh of OA, fibromyalgia, chronic pain, tobacco abuse, HTN, left-eye paralysis, recent VDRF presented on 04/12/16 with left hemiplegia. History taken from chart review and patient.  Upon EMS evaluation her BP was noted to be 260/140.  She was noted to have left-sided weakness, agitation, nonsensical speech, febrile, and tachycardic. There were several empty pill bottles, including benzos and opioids at her home.  Upon arrival to the hospital, head CT was performed, reviewed, which was negative. She was given TPA and started on empiric treatment for antibiotics. 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 ID was consulted and she was started on steroids (which has since been discontinued) for pneumococcal bacteremia and meningitis. Later, an MRI was performed which showed superior sagittal sinus thrombus. A TEE was performed which was negative for vegetation. Repeat blood cultures no growth to date. She will need IV antibiotics for a minimum of 4 weeks starting 2/3. She'll also need repeat MRI in 4 weeks. She was seen by hematology/oncology who recommended transition to Xarelto for anticoagulation.  Review of Systems  Constitutional: Negative for chills and fever.  HENT: Positive for tinnitus (Occasionally).   Eyes: Positive for double vision (Occasionally).       Left eye paralysis  Cardiovascular: Negative for chest pain.  Gastrointestinal: Positive for nausea and vomiting.  Genitourinary: Positive for frequency.  Musculoskeletal: Positive for back pain, joint pain and myalgias.  Neurological: Positive for focal weakness, weakness and headaches.  All other systems reviewed and are negative.  Past Medical History:  Diagnosis Date  .  Arrhythmia   . Arthritis   . Fibromyalgia   . GERD (gastroesophageal reflux disease)   . H/O: substance abuse 2005   narcotic usage due to chronic back pain  . Heart murmur   . Hypertension   . PONV (postoperative nausea and vomiting)    Past Surgical History:  Procedure Laterality Date  . ABDOMINAL HYSTERECTOMY    . ABDOMINAL SURGERY    . APPENDECTOMY    . BREAST SURGERY    . CHOLECYSTECTOMY N/A 09/30/2012   Procedure: LAPAROSCOPIC CHOLECYSTECTOMY WITH INTRAOPERATIVE CHOLANGIOGRAM;  Surgeon: Imogene Burn. Georgette Dover, MD;  Location: [facility];  Service: General;  Laterality: N/A;  . FRACTURE SURGERY     right upper arm  . OVARIAN CYST SURGERY     Family History  Problem Relation Age of Onset  . Alzheimer's disease Mother   . Cancer Father     prostate  . Heart disease Father    Social History:  reports that she has been smoking Cigarettes.  She has a 30.00 pack-year smoking history. She has never used smokeless tobacco. She reports that she does not drink alcohol or use drugs. Allergies:  Allergies  Allergen Reactions  . Codeine Nausea And Vomiting  . Nsaids Other (See Comments)    Stomach ulcers   Medications Prior to Admission  Medication Sig Dispense Refill  . acetaminophen (TYLENOL) 500 MG tablet Take 1,000 mg by mouth 3 (three) times daily as needed for moderate pain.     . diazepam (VALIUM) 5 MG tablet Take 2.5-5 mg by mouth 3 (three) times daily as needed for muscle spasms.    Marland Kitchen HYDROcodone-acetaminophen (NORCO/VICODIN) 5-325 MG tablet Take 1 tablet  by mouth 3 (three) times daily as needed (pain).    Marland Kitchen lisinopril (PRINIVIL,ZESTRIL) 5 MG tablet Take 1 tablet (5 mg total) by mouth daily. 90 tablet 0  . meloxicam (MOBIC) 15 MG tablet Take 15 mg by mouth daily as needed for pain.    . methadone (DOLOPHINE) 10 MG/ML solution Take 90 mg by mouth daily.     . chlorhexidine (PERIDEX) 0.12 % solution 15 mL swish and spit bid (Patient not taking: Reported on 03/31/2016) 480 mL 0  .  cyclobenzaprine (FLEXERIL) 10 MG tablet Take 1 tablet (10 mg total) by mouth 2 (two) times daily as needed for muscle spasms. (Patient not taking: Reported on 04/12/2016) 20 tablet 0  . lidocaine (XYLOCAINE) 2 % solution Use as directed 10 mLs in the mouth or throat every 6 (six) hours as needed for mouth pain. Hold in mouth and spit. Do not swallow. (Patient not taking: Reported on 03/31/2016) 200 mL 0  . ondansetron (ZOFRAN ODT) 8 MG disintegrating tablet Take 1 tablet (8 mg total) by mouth every 8 (eight) hours as needed for nausea or vomiting. (Patient not taking: Reported on 03/31/2016) 20 tablet 0  . traMADol (ULTRAM) 50 MG tablet Take 1 tablet (50 mg total) by mouth every 8 (eight) hours as needed. (Patient not taking: Reported on 04/12/2016) 30 tablet 0    Home: Home Living Family/patient expects to be discharged to:: Private residence Living Arrangements: Spouse/significant other Available Help at Discharge: Available PRN/intermittently Home Access: Stairs to enter Entrance Stairs-Number of Steps: 4 Entrance Stairs-Rails: None Home Layout: One level Home Equipment: None Additional Comments: husband works days  Functional History: Prior Function Level of Independence: Independent Functional Status:  Mobility: Bed Mobility Overal bed mobility: Needs Assistance Bed Mobility: Rolling, Sidelying to Sit Rolling: Min assist Sidelying to sit: Mod assist General bed mobility comments: assist to initiate roll then to raise trunk, increased time, pt reports headache at rest and LLE pain with movement Transfers Overall transfer level: Needs assistance Equipment used: Rolling walker (2 wheeled) Transfers: Sit to/from Stand, [employer] Transfers Sit to Stand: Mod assist Stand pivot transfers: Mod assist General transfer comment: mod A to rise, VCs hand placement, mod A to pivot, Mod A for balance 2* posterior lean, flexed trunk posture in standing, activity tolerance limited by LLE and head  pain Ambulation/Gait General Gait Details: NT- 2* pain    ADL:    Cognition: Cognition Overall Cognitive Status: Within Functional Limits for tasks assessed Orientation Level: Oriented to time, Oriented to place, Oriented to person Cognition Arousal/Alertness: Awake/alert Behavior During Therapy: WFL for tasks assessed/performed Overall Cognitive Status: Within Functional Limits for tasks assessed  Blood pressure (!) 123/56, pulse (!) 117, temperature (!) 100.4 F (38 C), temperature source Oral, resp. rate 18, height 5\' 4"  (1.626 m), weight 57.6 kg (127 lb), SpO2 100 %. Physical Exam  Vitals reviewed. Constitutional: She is oriented to person, place, and time. She appears well-developed.  Anorexia  HENT:  Head: Normocephalic and atraumatic.  Eyes: Conjunctivae are normal. Right eye exhibits no discharge. Left eye exhibits no discharge.  EOMI in right eye  Neck: Normal range of motion. Neck supple.  Cardiovascular: Normal rate and regular rhythm.   Respiratory: Effort normal and breath sounds normal.  GI: Soft. Bowel sounds are normal.  Musculoskeletal: She exhibits no edema or tenderness.  Neurological: She is alert and oriented to person, place, and time.  DTRs symmetric Motor: RUE: 4/5 proximal to distal LUE: 4-/5 proximal to distal  RLE: HF 4-/5, KE 4/5, ADF/PF 4+/5 LLE: HF 2/5, KE 3/5, ADF/PF 3+/5  Skin: Skin is warm.  Scattered hematomas  Psychiatric: She has a normal mood and affect. Her behavior is normal.    Results for orders placed or performed during the hospital encounter of 04/12/16 (from the past 24 hour(s))  Basic metabolic panel     Status: Abnormal   Collection Time: 04/20/16  5:30 AM  Result Value Ref Range   Sodium 136 135 - 145 mmol/L   Potassium 3.4 (L) 3.5 - 5.1 mmol/L   Chloride 98 (L) 101 - 111 mmol/L   CO2 26 22 - 32 mmol/L   Glucose, Bld 92 65 - 99 mg/dL   BUN 5 (L) 6 - 20 mg/dL   Creatinine, Ser 0.62 0.44 - 1.00 mg/dL   Calcium 8.3 (L)  8.9 - 10.3 mg/dL   GFR calc non Af Amer >60 >60 mL/min   GFR calc Af Amer >60 >60 mL/min   Anion gap 12 5 - 15   No results found.  Assessment/Plan: Diagnosis: Sagittal sinus thrombosis with left hemiparesis Labs and images independently reviewed.  Records reviewed and summated above.  1. Does the need for close, 24 hr/day medical supervision in concert with the patient's rehab needs make it unreasonable for this patient to be served in a less intensive setting? Yes  2. Co-Morbidities requiring supervision/potential complications: Meningitis (continue recs per ID for IV abx),   Streptococcal bacteremia (see previous), Septic thrombophlebitis of sagittal sinus (recs per hematology/oncology, anticoagulation with Xarelto), OA, fibromyalgia, tobacco abuse (counsel), HTN (monitor and provide prns in accordance with increased physical exertion and pain), left-eye paralysis, recent VDRF, Tachycardia (monitor in accordance with pain and increasing activity), Pyrexia (cont to monitor for signs and symptoms of infection, further workup if indicated), hypokalemia (continue to monitor and replete as necessary), ABLA (transfuse if necessary to ensure appropriate perfusion for increased activity tolerance), leukocytosis (cont to monitor for signs and symptoms of infection, further workup if indicated), chronic pain (Biofeedback training with therapies to help reduce reliance on opiate pain medications, particularly IV fentanyl, monitor pain control during therapies, and sedation at rest and titrate to maximum efficacy to ensure participation and gains in therapies) 3. Due to bladder management, safety, skin/wound care, disease management, pain management and patient education, does the patient require 24 hr/day rehab nursing? Yes 4. Does the patient require coordinated care of a physician, rehab nurse, PT (1-2 hrs/day, 5 days/week) and OT (1-2 hrs/day, 5 days/week) to address physical and functional deficits in the  context of the above medical diagnosis(es)? Yes Addressing deficits in the following areas: balance, endurance, locomotion, strength, transferring, bathing, dressing, toileting and psychosocial support 5. Can the patient actively participate in an intensive therapy program of at least 3 hrs of therapy per day at least 5 days per week? Yes 6. The potential for patient to make measurable gains while on inpatient rehab is excellent 7. Anticipated functional outcomes upon discharge from inpatient rehab are supervision and min assist  with PT, supervision and min assist with OT, n/a with SLP. 8. Estimated rehab length of stay to reach the above functional goals is: 16-19 days. 9. Does the patient have adequate social supports and living environment to accommodate these discharge functional goals? No 10. Anticipated D/C setting: SNF 11. Anticipated post D/C treatments: SNF 12. Overall Rehab/Functional Prognosis: good  RECOMMENDATIONS: This patient's condition is appropriate for continued rehabilitative care in the following setting: Pt will not be able to  achieve an independent level of functioning after a short IRF stay.  She states she has absolutely no support at discharge and given durantion of IV abx alone, she will need assistance.  At this point, recommend SNF when medically stable with PM&R follow up. Patient has agreed to participate in recommended program. Potentially Note that insurance prior authorization may be required for reimbursement for recommended care.  Comment: Rehab Admissions Coordinator to follow up.  Delice Lesch, MD, Mellody Drown 04/20/2016

## 2016-04-21 LAB — MAGNESIUM: Magnesium: 1.7 mg/dL (ref 1.7–2.4)

## 2016-04-21 MED ORDER — POTASSIUM CHLORIDE CRYS ER 20 MEQ PO TBCR
60.0000 meq | EXTENDED_RELEASE_TABLET | Freq: Once | ORAL | Status: AC
  Administered 2016-04-21: 60 meq via ORAL
  Filled 2016-04-21: qty 3

## 2016-04-21 NOTE — Progress Notes (Signed)
PROGRESS NOTE    [name]  NWG:[id number] DOB: 1950-12-15 DOA: 04/12/2016 PCP: Hilton Sinclair, MD   Outpatient Specialists:     Brief Narrative:  Amiliana Fedora is a 66 y.o. female with PMH as outlined below. She presented to [facility] ED 02/02 with acute onset left sided weakness and dysphasia that started between midnight and 1am.  Per her husband, pt has chronic neck pain but this had gotten worse over the past few days.  They saw PCP who prescribed her a prednisone pack and flexeril (she is chronically on methadone and valium; though valium is not listed on outpatient med list).  Pt and husband then went to sleep just before midnight and husband then woke up around 1am and found her to have above symptoms.  En route to ED, she had projectile vomiting.  In ED, she had fever (Tmax 102.7), tachycardia with rates in 150's, altered mental status, dysphasia, left sided weakness.  EMS informed EDP that there was concern for prescription drug abuse since pt had bottles of hydrocodone recently filled 4 days ago but only had 2 pills left, as well as diazepam bottle that was empty.  CT head was obtained and was normal.  She was evaluated by neurology who felt that she was a tPA candidate, she received this at 0435.  She was also started on empiric coverage for meningitis.  She had no improvement after tPA; therefore, PCCM was consulted for assistance.   Assessment & Plan:   Active Problems:   Cerebral embolism with cerebral infarction   Stroke (cerebrum) (HCC)   Meningitis   Acute respiratory failure (HCC)   Streptococcus pneumoniae meningitis   Streptococcal bacteremia   Septic thrombophlebitis of sagittal sinus   Fever   Left hemiparesis (HCC)   Generalized OA   Fibromyalgia   Tobacco abuse   Substance abuse   Benign essential HTN   Tachycardia   Chronic pain syndrome   Acute blood loss anemia   Leukocytosis   pneumococcal meningitis. She has pneumococcal bacteremia with suppurative  sagittal sinus thrombosis Acute infectious encephalopathy/sagittal sinus thrombosis S/p tPA at 0435 on 04/12/16. Blood 02/02 > S pneumo > pan-sens streptococcal bacteremia with subsequent meningitis. She has resolving left hemiplegia. She has left orbital paralysis. -TEE negative -repeat blood culture NGTD -seen by ID- patient has been on high-dose ceftriaxone 2 gram q 12 hours for minimum with 4 weeks of high dose ceftriaxone from date of first negative culture which was 04/13/16, per ID note from 04/17/16 -PICC line place She has a placed on anticoagulation with heparin, initially started on heparin gtt- , subsequently changed to Xarelto based upon Dr. Rexene Edison  recommended Xarelto, will need 3 months of anticoagulation   -MRI was done which showed a 4.5% segment of thrombus in the superior sagittal sinus ID recommends 4  weeks high dose ceftriaxone, repeat MRI brain in about 1 month Discontinued steroids after 2/7 dose-DEXAMETHASONE THERAPY HAS BEEN DISCONTINUED Plan to transfer to CIR  Acute respiratory failure due to poor airway protection / encephalopathy Tobacco dependence. Intubated 2/2 for airway protection. MRI and blood cx's show S pneumo meningitis. Extubated 2/7. Push pulm hygiene   Chronic pain with h/o abuse -patient on methadone at home here were several empty pill bottles, including benzos and opioids at her home.    Tobacco abuse -encourage cessation  Hypokalemia Replete   DVT prophylaxis:  Fully anticoagulated   Code Status: Full Code   Disposition-CIR approval pending   Consultants:   Oncology  PCCM  ID     Subjective:   Continues to have very low-grade fever Otherwise hemodynamically stable No headache today  Objective: Vitals:   04/20/16 1642 04/20/16 2100 04/21/16 0301 04/21/16 0610  BP: (!) 122/58 121/69 (!) 117/48 (!) 127/52  Pulse: 94 92 88 94  Resp: 18 20 16 20   Temp: 98.9 F (37.2 C) 99 F (37.2 C) 98.7 F (37.1 C) 99.3 F  (37.4 C)  TempSrc: Oral Oral Oral Oral  SpO2: 95% 100% 99% 100%  Weight:      Height:        Intake/Output Summary (Last 24 hours) at 04/21/16 0941 Last data filed at 04/20/16 1300  Gross per 24 hour  Intake              360 ml  Output                0 ml  Net              360 ml   Filed Weights   04/12/16 0300 04/12/16 0448  Weight: 57.6 kg (127 lb) 57.6 kg (127 lb)    Examination:  General exam: ill appearing, Awake and oriented Respiratory system: Clear to auscultation. Respiratory effort normal. Cardiovascular system: S1 & S2 heard, RRR. No JVD, murmurs, rubs, gallops or clicks. No pedal edema. Gastrointestinal system: Abdomen is nondistended, soft and nontender. No organomegaly or masses felt. Normal bowel sounds heard. Central nervous system: Alert and oriented. Poor insight     Data Reviewed: I have personally reviewed following labs and imaging studies  CBC:  Recent Labs Lab 04/15/16 0518 04/16/16 0311 04/17/16 0351 04/18/16 0349 04/19/16 0246  WBC 13.5* 11.6* 13.0* 24.7* 16.1*  HGB 8.9* 9.0* 9.2* 9.5* 9.9*  HCT 25.3* 25.8* 26.3* 28.1* 29.7*  MCV 93.4 93.1 93.3 93.0 94.0  PLT 338 388 422* 433* 453*   Basic Metabolic Panel:  Recent Labs Lab 04/16/16 0311 04/17/16 0351 04/18/16 0349 04/19/16 0246 04/20/16 0530  NA 144 140 137 137 136  K 3.5 3.7 3.4* 3.1* 3.4*  CL 110 106 104 102 98*  CO2 22 21* 24 25 26   GLUCOSE 153* 154* 110* 91 92  BUN 33* 25* 21* 11 5*  CREATININE 0.73 0.63 0.59 0.68 0.62  CALCIUM 8.4* 8.3* 8.4* 8.0* 8.3*  MG  --   --  2.0  --   --   PHOS  --   --  2.6  --   --    GFR: Estimated Creatinine Clearance: 61.3 mL/min (by C-G formula based on SCr of 0.62 mg/dL). Liver Function Tests: No results for input(s): AST, ALT, ALKPHOS, BILITOT, PROT, ALBUMIN in the last 168 hours. No results for input(s): LIPASE, AMYLASE in the last 168 hours. No results for input(s): AMMONIA in the last 168 hours. Coagulation Profile: No results  for input(s): INR, PROTIME in the last 168 hours. Cardiac Enzymes: No results for input(s): CKTOTAL, CKMB, CKMBINDEX, TROPONINI in the last 168 hours. BNP (last 3 results) No results for input(s): PROBNP in the last 8760 hours. HbA1C: No results for input(s): HGBA1C in the last 72 hours. CBG: No results for input(s): GLUCAP in the last 168 hours. Lipid Profile: No results for input(s): CHOL, HDL, LDLCALC, TRIG, CHOLHDL, LDLDIRECT in the last 72 hours. Thyroid Function Tests: No results for input(s): TSH, T4TOTAL, FREET4, T3FREE, THYROIDAB in the last 72 hours. Anemia Panel: No results for input(s): VITAMINB12, FOLATE, FERRITIN, TIBC, IRON, RETICCTPCT in the last 72 hours. Urine  analysis:    Component Value Date/Time   COLORURINE YELLOW 04/12/2016 0456   APPEARANCEUR CLEAR 04/12/2016 0456   LABSPEC 1.019 04/12/2016 0456   PHURINE 5.0 04/12/2016 0456   GLUCOSEU NEGATIVE 04/12/2016 0456   HGBUR SMALL (A) 04/12/2016 0456   BILIRUBINUR NEGATIVE 04/12/2016 0456   KETONESUR NEGATIVE 04/12/2016 0456   PROTEINUR NEGATIVE 04/12/2016 0456   UROBILINOGEN 0.2 05/26/2013 1730   NITRITE NEGATIVE 04/12/2016 0456   LEUKOCYTESUR NEGATIVE 04/12/2016 0456   Sepsis Labs: @LABRCNTIP (procalcitonin:4,lacticidven:4)  ) Recent Results (from the past 240 hour(s))  Blood Culture (routine x 2)     Status: Abnormal   Collection Time: 04/12/16  4:05 AM  Result Value Ref Range Status   Specimen Description BLOOD RIGHT HAND  Final   Special Requests IN PEDIATRIC BOTTLE  Final   Culture  Setup Time   Final    GRAM POSITIVE COCCI IN PAIRS IN PEDIATRIC BOTTLE CRITICAL RESULT CALLED TO, READ BACK BY AND VERIFIED WITH: M MACCIA,PHARMD AT 1610 04/12/16 BY L BENFIELD    Culture (A)  Final    STREPTOCOCCUS PNEUMONIAE SUSCEPTIBILITIES PERFORMED ON PREVIOUS CULTURE WITHIN THE LAST 5 DAYS.    Report Status 04/14/2016 FINAL  Final  Blood Culture (routine x 2)     Status: Abnormal   Collection Time: 04/12/16   4:15 AM  Result Value Ref Range Status   Specimen Description BLOOD LEFT FOREARM  Final   Special Requests BOTTLES DRAWN AEROBIC ONLY  Final   Culture  Setup Time   Final    GRAM POSITIVE COCCI IN CHAINS AEROBIC BOTTLE ONLY CRITICAL RESULT CALLED TO, READ BACK BY AND VERIFIED WITH: M MACCIA,PHARMD AT 1610 04/12/16 BY L BENFIELD    Culture STREPTOCOCCUS PNEUMONIAE (A)  Final   Report Status 04/14/2016 FINAL  Final   Organism ID, Bacteria STREPTOCOCCUS PNEUMONIAE  Final      Susceptibility   Streptococcus pneumoniae - MIC*    ERYTHROMYCIN <=0.12 SENSITIVE Sensitive     LEVOFLOXACIN 0.5 SENSITIVE Sensitive     PENICILLIN <=0.06 SENSITIVE Sensitive     CEFTRIAXONE <=0.12 SENSITIVE Sensitive     * STREPTOCOCCUS PNEUMONIAE  Blood Culture ID Panel (Reflexed)     Status: Abnormal   Collection Time: 04/12/16  4:15 AM  Result Value Ref Range Status   Enterococcus species NOT DETECTED NOT DETECTED Final   Listeria monocytogenes NOT DETECTED NOT DETECTED Final   Staphylococcus species NOT DETECTED NOT DETECTED Final   Staphylococcus aureus NOT DETECTED NOT DETECTED Final   Streptococcus species DETECTED (A) NOT DETECTED Final    Comment: CRITICAL RESULT CALLED TO, READ BACK BY AND VERIFIED WITH: M MACCIA,PHARMD AT 1610 04/12/16 BY L BENFIELD    Streptococcus agalactiae NOT DETECTED NOT DETECTED Final   Streptococcus pneumoniae DETECTED (A) NOT DETECTED Final    Comment: CRITICAL RESULT CALLED TO, READ BACK BY AND VERIFIED WITH: M MACCIA,PHARMD AT 1610 04/12/16 BY L BENFIELD    Streptococcus pyogenes NOT DETECTED NOT DETECTED Final   Acinetobacter baumannii NOT DETECTED NOT DETECTED Final   Enterobacteriaceae species NOT DETECTED NOT DETECTED Final   Enterobacter cloacae complex NOT DETECTED NOT DETECTED Final   Escherichia coli NOT DETECTED NOT DETECTED Final   Klebsiella oxytoca NOT DETECTED NOT DETECTED Final   Klebsiella pneumoniae NOT DETECTED NOT DETECTED Final   Proteus species NOT  DETECTED NOT DETECTED Final   Serratia marcescens NOT DETECTED NOT DETECTED Final   Haemophilus influenzae NOT DETECTED NOT DETECTED Final   Neisseria meningitidis  NOT DETECTED NOT DETECTED Final   Pseudomonas aeruginosa NOT DETECTED NOT DETECTED Final   Candida albicans NOT DETECTED NOT DETECTED Final   Candida glabrata NOT DETECTED NOT DETECTED Final   Candida krusei NOT DETECTED NOT DETECTED Final   Candida parapsilosis NOT DETECTED NOT DETECTED Final   Candida tropicalis NOT DETECTED NOT DETECTED Final  Urine culture     Status: None   Collection Time: 04/12/16  4:56 AM  Result Value Ref Range Status   Specimen Description URINE, RANDOM  Final   Special Requests NONE  Final   Culture NO GROWTH  Final   Report Status 04/13/2016 FINAL  Final  MRSA PCR Screening     Status: None   Collection Time: 04/12/16  6:51 PM  Result Value Ref Range Status   MRSA by PCR NEGATIVE NEGATIVE Final    Comment:        The GeneXpert MRSA Assay (FDA approved for NASAL specimens only), is one component of a comprehensive MRSA colonization surveillance program. It is not intended to diagnose MRSA infection nor to guide or monitor treatment for MRSA infections.   Culture, blood (Routine X 2) w Reflex to ID Panel     Status: None   Collection Time: 04/13/16 12:35 PM  Result Value Ref Range Status   Specimen Description BLOOD LEFT HAND  Final   Special Requests IN PEDIATRIC BOTTLE 3CC  Final   Culture NO GROWTH 5 DAYS  Final   Report Status 04/18/2016 FINAL  Final      Anti-infectives    Start     Dose/Rate Route Frequency Ordered Stop   04/15/16 1400  fluconazole (DIFLUCAN) IVPB 100 mg     100 mg 50 mL/hr over 60 Minutes Intravenous Every 24 hours 04/15/16 1200 04/17/16 1558   04/12/16 1800  cefTRIAXone (ROCEPHIN) 2 g in dextrose 5 % 50 mL IVPB     2 g 100 mL/hr over 30 Minutes Intravenous Every 12 hours 04/12/16 0514     04/12/16 1700  acyclovir (ZOVIRAX) 550 mg in dextrose 5 % 100 mL  IVPB  Status:  Discontinued     550 mg 111 mL/hr over 60 Minutes Intravenous Every 12 hours 04/12/16 0514 04/13/16 1126   04/12/16 1600  vancomycin (VANCOCIN) 500 mg in sodium chloride 0.9 % 100 mL IVPB  Status:  Discontinued     500 mg 100 mL/hr over 60 Minutes Intravenous Every 12 hours 04/12/16 0514 04/14/16 1158   04/12/16 0800  ampicillin (OMNIPEN) 2 g in sodium chloride 0.9 % 50 mL IVPB  Status:  Discontinued     2 g 150 mL/hr over 20 Minutes Intravenous Every 4 hours 04/12/16 0514 04/13/16 1126   04/12/16 0515  acyclovir (ZOVIRAX) 550 mg in dextrose 5 % 100 mL IVPB     550 mg 111 mL/hr over 60 Minutes Intravenous  Once 04/12/16 0514 04/12/16 0800   04/12/16 0400  cefTRIAXone (ROCEPHIN) 2 g in dextrose 5 % 50 mL IVPB     2 g 100 mL/hr over 30 Minutes Intravenous  Once 04/12/16 0357 04/12/16 0658   04/12/16 0400  vancomycin (VANCOCIN) IVPB 1000 mg/200 mL premix     1,000 mg 200 mL/hr over 60 Minutes Intravenous  Once 04/12/16 0357 04/12/16 0536   04/12/16 0400  ampicillin (OMNIPEN) 2 g in sodium chloride 0.9 % 50 mL IVPB     2 g 150 mL/hr over 20 Minutes Intravenous  Once 04/12/16 0357 04/12/16 1324  Radiology Studies: No results found.      Scheduled Meds: .  stroke: mapping our early stages of recovery book   Does not apply Once  . cefTRIAXone (ROCEPHIN)  IV  2 g Intravenous Q12H  . feeding supplement (ENSURE ENLIVE)  237 mL Oral BID BM  . levETIRAcetam  500 mg Oral Q12H  . methadone  60 mg Oral Daily  . pantoprazole  40 mg Oral QHS  . potassium chloride  60 mEq Oral Once  . Rivaroxaban  15 mg Oral BID WC   Continuous Infusions: . sodium chloride 30 mL/hr at 04/18/16 1600     LOS: 9 days    Time spent: 35 min    Davanta Meuser, DO [facility] Pager [phone]  If 7PM-7AM, please contact night-coverage [email or web address] Password [facility] 04/21/2016, 9:41 AM

## 2016-04-22 DIAGNOSIS — H4902 Third [oculomotor] nerve palsy, left eye: Secondary | ICD-10-CM

## 2016-04-22 LAB — CBC
HCT: 25 % — ABNORMAL LOW (ref 36.0–46.0)
Hemoglobin: 8.3 g/dL — ABNORMAL LOW (ref 12.0–15.0)
MCH: 32 pg (ref 26.0–34.0)
MCHC: 33.2 g/dL (ref 30.0–36.0)
MCV: 96.5 fL (ref 78.0–100.0)
Platelets: 375 10*3/uL (ref 150–400)
RBC: 2.59 MIL/uL — ABNORMAL LOW (ref 3.87–5.11)
RDW: 13.9 % (ref 11.5–15.5)
WBC: 11.2 10*3/uL — ABNORMAL HIGH (ref 4.0–10.5)

## 2016-04-22 MED ORDER — METHADONE HCL 10 MG PO TABS
60.0000 mg | ORAL_TABLET | Freq: Every day | ORAL | 0 refills | Status: DC
Start: 2016-04-23 — End: 2016-04-22

## 2016-04-22 MED ORDER — DEXTROSE 5 % IV SOLN: 2.0000 g | Freq: Two times a day (BID) | INTRAVENOUS | 0 refills | Status: DC

## 2016-04-22 MED ORDER — PANTOPRAZOLE SODIUM 40 MG PO TBEC: 40.0000 mg | DELAYED_RELEASE_TABLET | Freq: Every day | ORAL | 2 refills | Status: DC

## 2016-04-22 MED ORDER — LEVETIRACETAM 500 MG PO TABS: 500.0000 mg | ORAL_TABLET | Freq: Two times a day (BID) | ORAL | 2 refills | Status: DC

## 2016-04-22 MED ORDER — FENTANYL CITRATE (PF) 100 MCG/2ML IJ SOLN
50.0000 ug | Freq: Once | INTRAMUSCULAR | Status: AC
  Administered 2016-04-22: 50 ug via INTRAVENOUS
  Filled 2016-04-22: qty 2

## 2016-04-22 MED ORDER — ENSURE ENLIVE PO LIQD: 237.0000 mL | Freq: Two times a day (BID) | ORAL | 12 refills | Status: DC

## 2016-04-22 MED ORDER — RIVAROXABAN 20 MG PO TABS: 20.0000 mg | ORAL_TABLET | Freq: Every day | ORAL | Status: DC

## 2016-04-22 MED ORDER — RIVAROXABAN 15 MG PO TABS: 15.0000 mg | ORAL_TABLET | Freq: Two times a day (BID) | ORAL | 1 refills | Status: DC

## 2016-04-22 MED ORDER — CEFTRIAXONE IV (FOR PTA / DISCHARGE USE ONLY): 2.0000 g | Freq: Two times a day (BID) | INTRAVENOUS | 0 refills | Status: AC

## 2016-04-22 MED ORDER — CEFTRIAXONE IV (FOR PTA / DISCHARGE USE ONLY): 2.0000 g | Freq: Two times a day (BID) | INTRAVENOUS | 0 refills | Status: DC

## 2016-04-22 MED ORDER — RIVAROXABAN 20 MG PO TABS: 20.0000 mg | ORAL_TABLET | Freq: Every day | ORAL | 3 refills | Status: DC

## 2016-04-22 MED ORDER — DOCUSATE SODIUM 50 MG/5ML PO LIQD: 100.0000 mg | Freq: Two times a day (BID) | ORAL | 0 refills | Status: DC | PRN

## 2016-04-22 NOTE — Progress Notes (Signed)
[name] looks quite good compared to when I saw her on Friday. She is now off heparin. She is on Xarelto. I think pharmacy is helping with dosing Xarelto.  It sounds like she is going home tomorrow. She has a PICC line in. She'll be getting outpatient antibiotics. She has a streptococcal meningitis.  Looks like her left leg might be getting a little bit stronger. Again she is doing well with physical therapy.  She is quite anemic. This is not be watched closely. Her hemoglobin is 8.3. It was 9.93 days ago. I hope this is a be watched and followed before she is discharged. She I suppose is at risk for bleeding. There is no obvious bleeding from what I can tell.  She sees me in a little bit better. She's not having any nausea or vomiting.  Her physical exam shows her temperature should be 98.8. I think her MAXIMUM TEMPERATURE yesterday was 100.5. Her blood pressure is 123/63. Her pulse is 98. Her hent exam shows no scleral icterus. 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 This is chronic. This I don't think is from the meningitis. She has no oral lesions. There is no adenopathy in the neck. Lungs are clear. She masses slight decrease at the bases. Cardiac exam tachycardic but regular. She has no murmurs, rubs or bruits. Abdomen is soft. She has good bowel sounds. There is no fluid wave. There is no palpable liver or spleen tip. Extremities shows some decreased range of motion of the left leg. This is mostly in the left thigh. Right leg is unremarkable. Skin exam shows resolving ecchymosis. Neurological exam does show the weakness in the left leg. Her left arm strength is improved. 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  She has a superior sagittal sinus (SSS) thrombosis. This, I think, is from the meningitis. This is very focal.  She will need at least 3 months of anticoagulation. I would repeat a MRI in about 6 weeks.  I am worried about her anemia. This hemoglobin dropped significantly.  I think this has to be evaluated. She has to be tested for any kind of GI bleeding.  As now patient, she will see me in the office. I probably would like to see her back in about 2 weeks or so.  We had a very good prayer session. Her faith is incredibly strong.  Lattie Haw, MD  Darlyn Chamber 29:11

## 2016-04-22 NOTE — Care Management Note (Addendum)
Case Management Note  Patient Details  Name: Rosarie Matuska MRN: [id number] Date of Birth: [birth year]  Subjective/Objective:                    Action/Plan: Plan is for patient to d/c home with [facility] orders after discussion with Dr Allyson Sabal and the patient. Pt states she has never abused IV drugs and currently is on methadone through a pain clinic. Pt states she lives with her husband but that he works during the day. She states she has 4 sisters that can assist her as needed with the IV antibiotics.  CM provided her a list of [facility] agencies. She selected [facility]. Pam with [facility] notified and accepted the referral. Pam to come and assess the pt with her learning of the IV antibiotic process.  Pt starting on Xarelto. CM provided her the 30 day free card.  Await further HH orders.   Expected Discharge Date:   (Pending)               Expected Discharge Plan:  [place]  In-House Referral:     Discharge planning Services  CM Consult  Post Acute Care Choice:  Home Health, Durable Medical Equipment Choice offered to:  Patient  DME Arranged:    DME Agency:  [place]:  RN [facility] Agency:  [place]  Status of Service:  In process, will continue to follow  If discussed at Long Length of Stay Meetings, dates discussed:    Additional Comments:  Pollie Friar, RN 04/22/2016, 12:43 PM

## 2016-04-22 NOTE — Discharge Summary (Addendum)
Physician Discharge Summary  [name] MRN: [id number] DOB/AGE: 03/11/1951 66 y.o.  PCP: Evette Doffing, MD   Admit date: 04/12/2016 Discharge date: 04/22/2016  Discharge Diagnoses:    Active Problems:   Cerebral embolism with cerebral infarction   Stroke (cerebrum) (HCC)   Meningitis   Acute respiratory failure (HCC)   Streptococcus pneumoniae meningitis   Streptococcal bacteremia   Septic thrombophlebitis of sagittal sinus   Fever   Left hemiparesis (HCC)   Generalized OA   Fibromyalgia   Tobacco abuse   Substance abuse   Benign essential HTN   Tachycardia   Chronic pain syndrome   Acute blood loss anemia   Leukocytosis    Follow-up recommendations Follow-up with PCP in 3-5 days , including all  additional recommended appointments as below Follow-up CBC, CMP in 3-5 days   Addendum Per case manager , patient needs to make arrangements for [facility] CANCELLED ON 2/12   Current Discharge Medication List    START taking these medications   Details  cefTRIAXone (ROCEPHIN) IVPB Inject 2 g into the vein every 12 (twelve) hours. Indication:  Pneumococcal bacteremia with septic sagittal sinus thrombosis Last Day of Therapy:  05/24/16 Labs - Once weekly:  CBC/D and BMP, Labs - Every other week:  ESR and CRP Qty: 68 Units, Refills: 0    docusate (COLACE) 50 MG/5ML liquid Place 10 mLs (100 mg total) into feeding tube 2 (two) times daily as needed for mild constipation. Qty: 100 mL, Refills: 0    feeding supplement, ENSURE ENLIVE, (ENSURE ENLIVE) LIQD Take 237 mLs by mouth 2 (two) times daily between meals. Qty: 237 mL, Refills: 12    levETIRAcetam (KEPPRA) 500 MG tablet Take 1 tablet (500 mg total) by mouth every 12 (twelve) hours. Qty: 60 tablet, Refills: 2    pantoprazole (PROTONIX) 40 MG tablet Take 1 tablet (40 mg total) by mouth at bedtime. Qty: 30 tablet, Refills: 2    !! Rivaroxaban (XARELTO) 15 MG TABS tablet Take 1 tablet (15 mg total) by mouth 2 (two)  times daily with a meal. Qty: 42 tablet, Refills: 1    !! rivaroxaban (XARELTO) 20 MG TABS tablet Take 1 tablet (20 mg total) by mouth daily with supper. Qty: 30 tablet, Refills: 3     !! - Potential duplicate medications found. Please discuss with provider.    CONTINUE these medications which have NOT CHANGED   Details  acetaminophen (TYLENOL) 500 MG tablet Take 1,000 mg by mouth 3 (three) times daily as needed for moderate pain.     HYDROcodone-acetaminophen (NORCO/VICODIN) 5-325 MG tablet Take 1 tablet by mouth 3 (three) times daily as needed (pain).    methadone (DOLOPHINE) 10 MG/ML solution Take 90 mg by mouth daily.     chlorhexidine (PERIDEX) 0.12 % solution 15 mL swish and spit bid Qty: 480 mL, Refills: 0    cyclobenzaprine (FLEXERIL) 10 MG tablet Take 1 tablet (10 mg total) by mouth 2 (two) times daily as needed for muscle spasms. Qty: 20 tablet, Refills: 0    lidocaine (XYLOCAINE) 2 % solution Use as directed 10 mLs in the mouth or throat every 6 (six) hours as needed for mouth pain. Hold in mouth and spit. Do not swallow. Qty: 200 mL, Refills: 0    ondansetron (ZOFRAN ODT) 8 MG disintegrating tablet Take 1 tablet (8 mg total) by mouth every 8 (eight) hours as needed for nausea or vomiting. Qty: 20 tablet, Refills: 0  Physician Discharge Summary  [name] MRN: [id number] DOB/AGE: 03/11/1951 66 y.o.  PCP: Evette Doffing, MD   Admit date: 04/12/2016 Discharge date: 04/22/2016  Discharge Diagnoses:    Active Problems:   Cerebral embolism with cerebral infarction   Stroke (cerebrum) (HCC)   Meningitis   Acute respiratory failure (HCC)   Streptococcus pneumoniae meningitis   Streptococcal bacteremia   Septic thrombophlebitis of sagittal sinus   Fever   Left hemiparesis (HCC)   Generalized OA   Fibromyalgia   Tobacco abuse   Substance abuse   Benign essential HTN   Tachycardia   Chronic pain syndrome   Acute blood loss anemia   Leukocytosis    Follow-up recommendations Follow-up with PCP in 3-5 days , including all  additional recommended appointments as below Follow-up CBC, CMP in 3-5 days   Addendum Per case manager , patient needs to make arrangements for [facility] CANCELLED ON 2/12   Current Discharge Medication List    START taking these medications   Details  cefTRIAXone (ROCEPHIN) IVPB Inject 2 g into the vein every 12 (twelve) hours. Indication:  Pneumococcal bacteremia with septic sagittal sinus thrombosis Last Day of Therapy:  05/24/16 Labs - Once weekly:  CBC/D and BMP, Labs - Every other week:  ESR and CRP Qty: 68 Units, Refills: 0    docusate (COLACE) 50 MG/5ML liquid Place 10 mLs (100 mg total) into feeding tube 2 (two) times daily as needed for mild constipation. Qty: 100 mL, Refills: 0    feeding supplement, ENSURE ENLIVE, (ENSURE ENLIVE) LIQD Take 237 mLs by mouth 2 (two) times daily between meals. Qty: 237 mL, Refills: 12    levETIRAcetam (KEPPRA) 500 MG tablet Take 1 tablet (500 mg total) by mouth every 12 (twelve) hours. Qty: 60 tablet, Refills: 2    pantoprazole (PROTONIX) 40 MG tablet Take 1 tablet (40 mg total) by mouth at bedtime. Qty: 30 tablet, Refills: 2    !! Rivaroxaban (XARELTO) 15 MG TABS tablet Take 1 tablet (15 mg total) by mouth 2 (two)  times daily with a meal. Qty: 42 tablet, Refills: 1    !! rivaroxaban (XARELTO) 20 MG TABS tablet Take 1 tablet (20 mg total) by mouth daily with supper. Qty: 30 tablet, Refills: 3     !! - Potential duplicate medications found. Please discuss with provider.    CONTINUE these medications which have NOT CHANGED   Details  acetaminophen (TYLENOL) 500 MG tablet Take 1,000 mg by mouth 3 (three) times daily as needed for moderate pain.     HYDROcodone-acetaminophen (NORCO/VICODIN) 5-325 MG tablet Take 1 tablet by mouth 3 (three) times daily as needed (pain).    methadone (DOLOPHINE) 10 MG/ML solution Take 90 mg by mouth daily.     chlorhexidine (PERIDEX) 0.12 % solution 15 mL swish and spit bid Qty: 480 mL, Refills: 0    cyclobenzaprine (FLEXERIL) 10 MG tablet Take 1 tablet (10 mg total) by mouth 2 (two) times daily as needed for muscle spasms. Qty: 20 tablet, Refills: 0    lidocaine (XYLOCAINE) 2 % solution Use as directed 10 mLs in the mouth or throat every 6 (six) hours as needed for mouth pain. Hold in mouth and spit. Do not swallow. Qty: 200 mL, Refills: 0    ondansetron (ZOFRAN ODT) 8 MG disintegrating tablet Take 1 tablet (8 mg total) by mouth every 8 (eight) hours as needed for nausea or vomiting. Qty: 20 tablet, Refills: 0  Physician Discharge Summary  [name] MRN: [id number] DOB/AGE: 03/11/1951 66 y.o.  PCP: Evette Doffing, MD   Admit date: 04/12/2016 Discharge date: 04/22/2016  Discharge Diagnoses:    Active Problems:   Cerebral embolism with cerebral infarction   Stroke (cerebrum) (HCC)   Meningitis   Acute respiratory failure (HCC)   Streptococcus pneumoniae meningitis   Streptococcal bacteremia   Septic thrombophlebitis of sagittal sinus   Fever   Left hemiparesis (HCC)   Generalized OA   Fibromyalgia   Tobacco abuse   Substance abuse   Benign essential HTN   Tachycardia   Chronic pain syndrome   Acute blood loss anemia   Leukocytosis    Follow-up recommendations Follow-up with PCP in 3-5 days , including all  additional recommended appointments as below Follow-up CBC, CMP in 3-5 days   Addendum Per case manager , patient needs to make arrangements for [facility] CANCELLED ON 2/12   Current Discharge Medication List    START taking these medications   Details  cefTRIAXone (ROCEPHIN) IVPB Inject 2 g into the vein every 12 (twelve) hours. Indication:  Pneumococcal bacteremia with septic sagittal sinus thrombosis Last Day of Therapy:  05/24/16 Labs - Once weekly:  CBC/D and BMP, Labs - Every other week:  ESR and CRP Qty: 68 Units, Refills: 0    docusate (COLACE) 50 MG/5ML liquid Place 10 mLs (100 mg total) into feeding tube 2 (two) times daily as needed for mild constipation. Qty: 100 mL, Refills: 0    feeding supplement, ENSURE ENLIVE, (ENSURE ENLIVE) LIQD Take 237 mLs by mouth 2 (two) times daily between meals. Qty: 237 mL, Refills: 12    levETIRAcetam (KEPPRA) 500 MG tablet Take 1 tablet (500 mg total) by mouth every 12 (twelve) hours. Qty: 60 tablet, Refills: 2    pantoprazole (PROTONIX) 40 MG tablet Take 1 tablet (40 mg total) by mouth at bedtime. Qty: 30 tablet, Refills: 2    !! Rivaroxaban (XARELTO) 15 MG TABS tablet Take 1 tablet (15 mg total) by mouth 2 (two)  times daily with a meal. Qty: 42 tablet, Refills: 1    !! rivaroxaban (XARELTO) 20 MG TABS tablet Take 1 tablet (20 mg total) by mouth daily with supper. Qty: 30 tablet, Refills: 3     !! - Potential duplicate medications found. Please discuss with provider.    CONTINUE these medications which have NOT CHANGED   Details  acetaminophen (TYLENOL) 500 MG tablet Take 1,000 mg by mouth 3 (three) times daily as needed for moderate pain.     HYDROcodone-acetaminophen (NORCO/VICODIN) 5-325 MG tablet Take 1 tablet by mouth 3 (three) times daily as needed (pain).    methadone (DOLOPHINE) 10 MG/ML solution Take 90 mg by mouth daily.     chlorhexidine (PERIDEX) 0.12 % solution 15 mL swish and spit bid Qty: 480 mL, Refills: 0    cyclobenzaprine (FLEXERIL) 10 MG tablet Take 1 tablet (10 mg total) by mouth 2 (two) times daily as needed for muscle spasms. Qty: 20 tablet, Refills: 0    lidocaine (XYLOCAINE) 2 % solution Use as directed 10 mLs in the mouth or throat every 6 (six) hours as needed for mouth pain. Hold in mouth and spit. Do not swallow. Qty: 200 mL, Refills: 0    ondansetron (ZOFRAN ODT) 8 MG disintegrating tablet Take 1 tablet (8 mg total) by mouth every 8 (eight) hours as needed for nausea or vomiting. Qty: 20 tablet, Refills: 0  Physician Discharge Summary  [name] MRN: [id number] DOB/AGE: 03/11/1951 66 y.o.  PCP: Evette Doffing, MD   Admit date: 04/12/2016 Discharge date: 04/22/2016  Discharge Diagnoses:    Active Problems:   Cerebral embolism with cerebral infarction   Stroke (cerebrum) (HCC)   Meningitis   Acute respiratory failure (HCC)   Streptococcus pneumoniae meningitis   Streptococcal bacteremia   Septic thrombophlebitis of sagittal sinus   Fever   Left hemiparesis (HCC)   Generalized OA   Fibromyalgia   Tobacco abuse   Substance abuse   Benign essential HTN   Tachycardia   Chronic pain syndrome   Acute blood loss anemia   Leukocytosis    Follow-up recommendations Follow-up with PCP in 3-5 days , including all  additional recommended appointments as below Follow-up CBC, CMP in 3-5 days   Addendum Per case manager , patient needs to make arrangements for [facility] CANCELLED ON 2/12   Current Discharge Medication List    START taking these medications   Details  cefTRIAXone (ROCEPHIN) IVPB Inject 2 g into the vein every 12 (twelve) hours. Indication:  Pneumococcal bacteremia with septic sagittal sinus thrombosis Last Day of Therapy:  05/24/16 Labs - Once weekly:  CBC/D and BMP, Labs - Every other week:  ESR and CRP Qty: 68 Units, Refills: 0    docusate (COLACE) 50 MG/5ML liquid Place 10 mLs (100 mg total) into feeding tube 2 (two) times daily as needed for mild constipation. Qty: 100 mL, Refills: 0    feeding supplement, ENSURE ENLIVE, (ENSURE ENLIVE) LIQD Take 237 mLs by mouth 2 (two) times daily between meals. Qty: 237 mL, Refills: 12    levETIRAcetam (KEPPRA) 500 MG tablet Take 1 tablet (500 mg total) by mouth every 12 (twelve) hours. Qty: 60 tablet, Refills: 2    pantoprazole (PROTONIX) 40 MG tablet Take 1 tablet (40 mg total) by mouth at bedtime. Qty: 30 tablet, Refills: 2    !! Rivaroxaban (XARELTO) 15 MG TABS tablet Take 1 tablet (15 mg total) by mouth 2 (two)  times daily with a meal. Qty: 42 tablet, Refills: 1    !! rivaroxaban (XARELTO) 20 MG TABS tablet Take 1 tablet (20 mg total) by mouth daily with supper. Qty: 30 tablet, Refills: 3     !! - Potential duplicate medications found. Please discuss with provider.    CONTINUE these medications which have NOT CHANGED   Details  acetaminophen (TYLENOL) 500 MG tablet Take 1,000 mg by mouth 3 (three) times daily as needed for moderate pain.     HYDROcodone-acetaminophen (NORCO/VICODIN) 5-325 MG tablet Take 1 tablet by mouth 3 (three) times daily as needed (pain).    methadone (DOLOPHINE) 10 MG/ML solution Take 90 mg by mouth daily.     chlorhexidine (PERIDEX) 0.12 % solution 15 mL swish and spit bid Qty: 480 mL, Refills: 0    cyclobenzaprine (FLEXERIL) 10 MG tablet Take 1 tablet (10 mg total) by mouth 2 (two) times daily as needed for muscle spasms. Qty: 20 tablet, Refills: 0    lidocaine (XYLOCAINE) 2 % solution Use as directed 10 mLs in the mouth or throat every 6 (six) hours as needed for mouth pain. Hold in mouth and spit. Do not swallow. Qty: 200 mL, Refills: 0    ondansetron (ZOFRAN ODT) 8 MG disintegrating tablet Take 1 tablet (8 mg total) by mouth every 8 (eight) hours as needed for nausea or vomiting. Qty: 20 tablet, Refills: 0  Physician Discharge Summary  [name] MRN: [id number] DOB/AGE: 03/11/1951 66 y.o.  PCP: Evette Doffing, MD   Admit date: 04/12/2016 Discharge date: 04/22/2016  Discharge Diagnoses:    Active Problems:   Cerebral embolism with cerebral infarction   Stroke (cerebrum) (HCC)   Meningitis   Acute respiratory failure (HCC)   Streptococcus pneumoniae meningitis   Streptococcal bacteremia   Septic thrombophlebitis of sagittal sinus   Fever   Left hemiparesis (HCC)   Generalized OA   Fibromyalgia   Tobacco abuse   Substance abuse   Benign essential HTN   Tachycardia   Chronic pain syndrome   Acute blood loss anemia   Leukocytosis    Follow-up recommendations Follow-up with PCP in 3-5 days , including all  additional recommended appointments as below Follow-up CBC, CMP in 3-5 days   Addendum Per case manager , patient needs to make arrangements for [facility] CANCELLED ON 2/12   Current Discharge Medication List    START taking these medications   Details  cefTRIAXone (ROCEPHIN) IVPB Inject 2 g into the vein every 12 (twelve) hours. Indication:  Pneumococcal bacteremia with septic sagittal sinus thrombosis Last Day of Therapy:  05/24/16 Labs - Once weekly:  CBC/D and BMP, Labs - Every other week:  ESR and CRP Qty: 68 Units, Refills: 0    docusate (COLACE) 50 MG/5ML liquid Place 10 mLs (100 mg total) into feeding tube 2 (two) times daily as needed for mild constipation. Qty: 100 mL, Refills: 0    feeding supplement, ENSURE ENLIVE, (ENSURE ENLIVE) LIQD Take 237 mLs by mouth 2 (two) times daily between meals. Qty: 237 mL, Refills: 12    levETIRAcetam (KEPPRA) 500 MG tablet Take 1 tablet (500 mg total) by mouth every 12 (twelve) hours. Qty: 60 tablet, Refills: 2    pantoprazole (PROTONIX) 40 MG tablet Take 1 tablet (40 mg total) by mouth at bedtime. Qty: 30 tablet, Refills: 2    !! Rivaroxaban (XARELTO) 15 MG TABS tablet Take 1 tablet (15 mg total) by mouth 2 (two)  times daily with a meal. Qty: 42 tablet, Refills: 1    !! rivaroxaban (XARELTO) 20 MG TABS tablet Take 1 tablet (20 mg total) by mouth daily with supper. Qty: 30 tablet, Refills: 3     !! - Potential duplicate medications found. Please discuss with provider.    CONTINUE these medications which have NOT CHANGED   Details  acetaminophen (TYLENOL) 500 MG tablet Take 1,000 mg by mouth 3 (three) times daily as needed for moderate pain.     HYDROcodone-acetaminophen (NORCO/VICODIN) 5-325 MG tablet Take 1 tablet by mouth 3 (three) times daily as needed (pain).    methadone (DOLOPHINE) 10 MG/ML solution Take 90 mg by mouth daily.     chlorhexidine (PERIDEX) 0.12 % solution 15 mL swish and spit bid Qty: 480 mL, Refills: 0    cyclobenzaprine (FLEXERIL) 10 MG tablet Take 1 tablet (10 mg total) by mouth 2 (two) times daily as needed for muscle spasms. Qty: 20 tablet, Refills: 0    lidocaine (XYLOCAINE) 2 % solution Use as directed 10 mLs in the mouth or throat every 6 (six) hours as needed for mouth pain. Hold in mouth and spit. Do not swallow. Qty: 200 mL, Refills: 0    ondansetron (ZOFRAN ODT) 8 MG disintegrating tablet Take 1 tablet (8 mg total) by mouth every 8 (eight) hours as needed for nausea or vomiting. Qty: 20 tablet, Refills: 0  Physician Discharge Summary  [name] MRN: [id number] DOB/AGE: 03/11/1951 66 y.o.  PCP: Evette Doffing, MD   Admit date: 04/12/2016 Discharge date: 04/22/2016  Discharge Diagnoses:    Active Problems:   Cerebral embolism with cerebral infarction   Stroke (cerebrum) (HCC)   Meningitis   Acute respiratory failure (HCC)   Streptococcus pneumoniae meningitis   Streptococcal bacteremia   Septic thrombophlebitis of sagittal sinus   Fever   Left hemiparesis (HCC)   Generalized OA   Fibromyalgia   Tobacco abuse   Substance abuse   Benign essential HTN   Tachycardia   Chronic pain syndrome   Acute blood loss anemia   Leukocytosis    Follow-up recommendations Follow-up with PCP in 3-5 days , including all  additional recommended appointments as below Follow-up CBC, CMP in 3-5 days   Addendum Per case manager , patient needs to make arrangements for [facility] CANCELLED ON 2/12   Current Discharge Medication List    START taking these medications   Details  cefTRIAXone (ROCEPHIN) IVPB Inject 2 g into the vein every 12 (twelve) hours. Indication:  Pneumococcal bacteremia with septic sagittal sinus thrombosis Last Day of Therapy:  05/24/16 Labs - Once weekly:  CBC/D and BMP, Labs - Every other week:  ESR and CRP Qty: 68 Units, Refills: 0    docusate (COLACE) 50 MG/5ML liquid Place 10 mLs (100 mg total) into feeding tube 2 (two) times daily as needed for mild constipation. Qty: 100 mL, Refills: 0    feeding supplement, ENSURE ENLIVE, (ENSURE ENLIVE) LIQD Take 237 mLs by mouth 2 (two) times daily between meals. Qty: 237 mL, Refills: 12    levETIRAcetam (KEPPRA) 500 MG tablet Take 1 tablet (500 mg total) by mouth every 12 (twelve) hours. Qty: 60 tablet, Refills: 2    pantoprazole (PROTONIX) 40 MG tablet Take 1 tablet (40 mg total) by mouth at bedtime. Qty: 30 tablet, Refills: 2    !! Rivaroxaban (XARELTO) 15 MG TABS tablet Take 1 tablet (15 mg total) by mouth 2 (two)  times daily with a meal. Qty: 42 tablet, Refills: 1    !! rivaroxaban (XARELTO) 20 MG TABS tablet Take 1 tablet (20 mg total) by mouth daily with supper. Qty: 30 tablet, Refills: 3     !! - Potential duplicate medications found. Please discuss with provider.    CONTINUE these medications which have NOT CHANGED   Details  acetaminophen (TYLENOL) 500 MG tablet Take 1,000 mg by mouth 3 (three) times daily as needed for moderate pain.     HYDROcodone-acetaminophen (NORCO/VICODIN) 5-325 MG tablet Take 1 tablet by mouth 3 (three) times daily as needed (pain).    methadone (DOLOPHINE) 10 MG/ML solution Take 90 mg by mouth daily.     chlorhexidine (PERIDEX) 0.12 % solution 15 mL swish and spit bid Qty: 480 mL, Refills: 0    cyclobenzaprine (FLEXERIL) 10 MG tablet Take 1 tablet (10 mg total) by mouth 2 (two) times daily as needed for muscle spasms. Qty: 20 tablet, Refills: 0    lidocaine (XYLOCAINE) 2 % solution Use as directed 10 mLs in the mouth or throat every 6 (six) hours as needed for mouth pain. Hold in mouth and spit. Do not swallow. Qty: 200 mL, Refills: 0    ondansetron (ZOFRAN ODT) 8 MG disintegrating tablet Take 1 tablet (8 mg total) by mouth every 8 (eight) hours as needed for nausea or vomiting. Qty: 20 tablet, Refills: 0  Physician Discharge Summary  [name] MRN: [id number] DOB/AGE: 03/11/1951 66 y.o.  PCP: Evette Doffing, MD   Admit date: 04/12/2016 Discharge date: 04/22/2016  Discharge Diagnoses:    Active Problems:   Cerebral embolism with cerebral infarction   Stroke (cerebrum) (HCC)   Meningitis   Acute respiratory failure (HCC)   Streptococcus pneumoniae meningitis   Streptococcal bacteremia   Septic thrombophlebitis of sagittal sinus   Fever   Left hemiparesis (HCC)   Generalized OA   Fibromyalgia   Tobacco abuse   Substance abuse   Benign essential HTN   Tachycardia   Chronic pain syndrome   Acute blood loss anemia   Leukocytosis    Follow-up recommendations Follow-up with PCP in 3-5 days , including all  additional recommended appointments as below Follow-up CBC, CMP in 3-5 days   Addendum Per case manager , patient needs to make arrangements for [facility] CANCELLED ON 2/12   Current Discharge Medication List    START taking these medications   Details  cefTRIAXone (ROCEPHIN) IVPB Inject 2 g into the vein every 12 (twelve) hours. Indication:  Pneumococcal bacteremia with septic sagittal sinus thrombosis Last Day of Therapy:  05/24/16 Labs - Once weekly:  CBC/D and BMP, Labs - Every other week:  ESR and CRP Qty: 68 Units, Refills: 0    docusate (COLACE) 50 MG/5ML liquid Place 10 mLs (100 mg total) into feeding tube 2 (two) times daily as needed for mild constipation. Qty: 100 mL, Refills: 0    feeding supplement, ENSURE ENLIVE, (ENSURE ENLIVE) LIQD Take 237 mLs by mouth 2 (two) times daily between meals. Qty: 237 mL, Refills: 12    levETIRAcetam (KEPPRA) 500 MG tablet Take 1 tablet (500 mg total) by mouth every 12 (twelve) hours. Qty: 60 tablet, Refills: 2    pantoprazole (PROTONIX) 40 MG tablet Take 1 tablet (40 mg total) by mouth at bedtime. Qty: 30 tablet, Refills: 2    !! Rivaroxaban (XARELTO) 15 MG TABS tablet Take 1 tablet (15 mg total) by mouth 2 (two)  times daily with a meal. Qty: 42 tablet, Refills: 1    !! rivaroxaban (XARELTO) 20 MG TABS tablet Take 1 tablet (20 mg total) by mouth daily with supper. Qty: 30 tablet, Refills: 3     !! - Potential duplicate medications found. Please discuss with provider.    CONTINUE these medications which have NOT CHANGED   Details  acetaminophen (TYLENOL) 500 MG tablet Take 1,000 mg by mouth 3 (three) times daily as needed for moderate pain.     HYDROcodone-acetaminophen (NORCO/VICODIN) 5-325 MG tablet Take 1 tablet by mouth 3 (three) times daily as needed (pain).    methadone (DOLOPHINE) 10 MG/ML solution Take 90 mg by mouth daily.     chlorhexidine (PERIDEX) 0.12 % solution 15 mL swish and spit bid Qty: 480 mL, Refills: 0    cyclobenzaprine (FLEXERIL) 10 MG tablet Take 1 tablet (10 mg total) by mouth 2 (two) times daily as needed for muscle spasms. Qty: 20 tablet, Refills: 0    lidocaine (XYLOCAINE) 2 % solution Use as directed 10 mLs in the mouth or throat every 6 (six) hours as needed for mouth pain. Hold in mouth and spit. Do not swallow. Qty: 200 mL, Refills: 0    ondansetron (ZOFRAN ODT) 8 MG disintegrating tablet Take 1 tablet (8 mg total) by mouth every 8 (eight) hours as needed for nausea or vomiting. Qty: 20 tablet, Refills: 0  Physician Discharge Summary  [name] MRN: [id number] DOB/AGE: 03/11/1951 66 y.o.  PCP: Evette Doffing, MD   Admit date: 04/12/2016 Discharge date: 04/22/2016  Discharge Diagnoses:    Active Problems:   Cerebral embolism with cerebral infarction   Stroke (cerebrum) (HCC)   Meningitis   Acute respiratory failure (HCC)   Streptococcus pneumoniae meningitis   Streptococcal bacteremia   Septic thrombophlebitis of sagittal sinus   Fever   Left hemiparesis (HCC)   Generalized OA   Fibromyalgia   Tobacco abuse   Substance abuse   Benign essential HTN   Tachycardia   Chronic pain syndrome   Acute blood loss anemia   Leukocytosis    Follow-up recommendations Follow-up with PCP in 3-5 days , including all  additional recommended appointments as below Follow-up CBC, CMP in 3-5 days   Addendum Per case manager , patient needs to make arrangements for [facility] CANCELLED ON 2/12   Current Discharge Medication List    START taking these medications   Details  cefTRIAXone (ROCEPHIN) IVPB Inject 2 g into the vein every 12 (twelve) hours. Indication:  Pneumococcal bacteremia with septic sagittal sinus thrombosis Last Day of Therapy:  05/24/16 Labs - Once weekly:  CBC/D and BMP, Labs - Every other week:  ESR and CRP Qty: 68 Units, Refills: 0    docusate (COLACE) 50 MG/5ML liquid Place 10 mLs (100 mg total) into feeding tube 2 (two) times daily as needed for mild constipation. Qty: 100 mL, Refills: 0    feeding supplement, ENSURE ENLIVE, (ENSURE ENLIVE) LIQD Take 237 mLs by mouth 2 (two) times daily between meals. Qty: 237 mL, Refills: 12    levETIRAcetam (KEPPRA) 500 MG tablet Take 1 tablet (500 mg total) by mouth every 12 (twelve) hours. Qty: 60 tablet, Refills: 2    pantoprazole (PROTONIX) 40 MG tablet Take 1 tablet (40 mg total) by mouth at bedtime. Qty: 30 tablet, Refills: 2    !! Rivaroxaban (XARELTO) 15 MG TABS tablet Take 1 tablet (15 mg total) by mouth 2 (two)  times daily with a meal. Qty: 42 tablet, Refills: 1    !! rivaroxaban (XARELTO) 20 MG TABS tablet Take 1 tablet (20 mg total) by mouth daily with supper. Qty: 30 tablet, Refills: 3     !! - Potential duplicate medications found. Please discuss with provider.    CONTINUE these medications which have NOT CHANGED   Details  acetaminophen (TYLENOL) 500 MG tablet Take 1,000 mg by mouth 3 (three) times daily as needed for moderate pain.     HYDROcodone-acetaminophen (NORCO/VICODIN) 5-325 MG tablet Take 1 tablet by mouth 3 (three) times daily as needed (pain).    methadone (DOLOPHINE) 10 MG/ML solution Take 90 mg by mouth daily.     chlorhexidine (PERIDEX) 0.12 % solution 15 mL swish and spit bid Qty: 480 mL, Refills: 0    cyclobenzaprine (FLEXERIL) 10 MG tablet Take 1 tablet (10 mg total) by mouth 2 (two) times daily as needed for muscle spasms. Qty: 20 tablet, Refills: 0    lidocaine (XYLOCAINE) 2 % solution Use as directed 10 mLs in the mouth or throat every 6 (six) hours as needed for mouth pain. Hold in mouth and spit. Do not swallow. Qty: 200 mL, Refills: 0    ondansetron (ZOFRAN ODT) 8 MG disintegrating tablet Take 1 tablet (8 mg total) by mouth every 8 (eight) hours as needed for nausea or vomiting. Qty: 20 tablet, Refills: 0  Physician Discharge Summary  [name] MRN: [id number] DOB/AGE: 03/11/1951 66 y.o.  PCP: Evette Doffing, MD   Admit date: 04/12/2016 Discharge date: 04/22/2016  Discharge Diagnoses:    Active Problems:   Cerebral embolism with cerebral infarction   Stroke (cerebrum) (HCC)   Meningitis   Acute respiratory failure (HCC)   Streptococcus pneumoniae meningitis   Streptococcal bacteremia   Septic thrombophlebitis of sagittal sinus   Fever   Left hemiparesis (HCC)   Generalized OA   Fibromyalgia   Tobacco abuse   Substance abuse   Benign essential HTN   Tachycardia   Chronic pain syndrome   Acute blood loss anemia   Leukocytosis    Follow-up recommendations Follow-up with PCP in 3-5 days , including all  additional recommended appointments as below Follow-up CBC, CMP in 3-5 days   Addendum Per case manager , patient needs to make arrangements for [facility] CANCELLED ON 2/12   Current Discharge Medication List    START taking these medications   Details  cefTRIAXone (ROCEPHIN) IVPB Inject 2 g into the vein every 12 (twelve) hours. Indication:  Pneumococcal bacteremia with septic sagittal sinus thrombosis Last Day of Therapy:  05/24/16 Labs - Once weekly:  CBC/D and BMP, Labs - Every other week:  ESR and CRP Qty: 68 Units, Refills: 0    docusate (COLACE) 50 MG/5ML liquid Place 10 mLs (100 mg total) into feeding tube 2 (two) times daily as needed for mild constipation. Qty: 100 mL, Refills: 0    feeding supplement, ENSURE ENLIVE, (ENSURE ENLIVE) LIQD Take 237 mLs by mouth 2 (two) times daily between meals. Qty: 237 mL, Refills: 12    levETIRAcetam (KEPPRA) 500 MG tablet Take 1 tablet (500 mg total) by mouth every 12 (twelve) hours. Qty: 60 tablet, Refills: 2    pantoprazole (PROTONIX) 40 MG tablet Take 1 tablet (40 mg total) by mouth at bedtime. Qty: 30 tablet, Refills: 2    !! Rivaroxaban (XARELTO) 15 MG TABS tablet Take 1 tablet (15 mg total) by mouth 2 (two)  times daily with a meal. Qty: 42 tablet, Refills: 1    !! rivaroxaban (XARELTO) 20 MG TABS tablet Take 1 tablet (20 mg total) by mouth daily with supper. Qty: 30 tablet, Refills: 3     !! - Potential duplicate medications found. Please discuss with provider.    CONTINUE these medications which have NOT CHANGED   Details  acetaminophen (TYLENOL) 500 MG tablet Take 1,000 mg by mouth 3 (three) times daily as needed for moderate pain.     HYDROcodone-acetaminophen (NORCO/VICODIN) 5-325 MG tablet Take 1 tablet by mouth 3 (three) times daily as needed (pain).    methadone (DOLOPHINE) 10 MG/ML solution Take 90 mg by mouth daily.     chlorhexidine (PERIDEX) 0.12 % solution 15 mL swish and spit bid Qty: 480 mL, Refills: 0    cyclobenzaprine (FLEXERIL) 10 MG tablet Take 1 tablet (10 mg total) by mouth 2 (two) times daily as needed for muscle spasms. Qty: 20 tablet, Refills: 0    lidocaine (XYLOCAINE) 2 % solution Use as directed 10 mLs in the mouth or throat every 6 (six) hours as needed for mouth pain. Hold in mouth and spit. Do not swallow. Qty: 200 mL, Refills: 0    ondansetron (ZOFRAN ODT) 8 MG disintegrating tablet Take 1 tablet (8 mg total) by mouth every 8 (eight) hours as needed for nausea or vomiting. Qty: 20 tablet, Refills: 0  Physician Discharge Summary  [name] MRN: [id number] DOB/AGE: 03/11/1951 66 y.o.  PCP: Evette Doffing, MD   Admit date: 04/12/2016 Discharge date: 04/22/2016  Discharge Diagnoses:    Active Problems:   Cerebral embolism with cerebral infarction   Stroke (cerebrum) (HCC)   Meningitis   Acute respiratory failure (HCC)   Streptococcus pneumoniae meningitis   Streptococcal bacteremia   Septic thrombophlebitis of sagittal sinus   Fever   Left hemiparesis (HCC)   Generalized OA   Fibromyalgia   Tobacco abuse   Substance abuse   Benign essential HTN   Tachycardia   Chronic pain syndrome   Acute blood loss anemia   Leukocytosis    Follow-up recommendations Follow-up with PCP in 3-5 days , including all  additional recommended appointments as below Follow-up CBC, CMP in 3-5 days   Addendum Per case manager , patient needs to make arrangements for [facility] CANCELLED ON 2/12   Current Discharge Medication List    START taking these medications   Details  cefTRIAXone (ROCEPHIN) IVPB Inject 2 g into the vein every 12 (twelve) hours. Indication:  Pneumococcal bacteremia with septic sagittal sinus thrombosis Last Day of Therapy:  05/24/16 Labs - Once weekly:  CBC/D and BMP, Labs - Every other week:  ESR and CRP Qty: 68 Units, Refills: 0    docusate (COLACE) 50 MG/5ML liquid Place 10 mLs (100 mg total) into feeding tube 2 (two) times daily as needed for mild constipation. Qty: 100 mL, Refills: 0    feeding supplement, ENSURE ENLIVE, (ENSURE ENLIVE) LIQD Take 237 mLs by mouth 2 (two) times daily between meals. Qty: 237 mL, Refills: 12    levETIRAcetam (KEPPRA) 500 MG tablet Take 1 tablet (500 mg total) by mouth every 12 (twelve) hours. Qty: 60 tablet, Refills: 2    pantoprazole (PROTONIX) 40 MG tablet Take 1 tablet (40 mg total) by mouth at bedtime. Qty: 30 tablet, Refills: 2    !! Rivaroxaban (XARELTO) 15 MG TABS tablet Take 1 tablet (15 mg total) by mouth 2 (two)  times daily with a meal. Qty: 42 tablet, Refills: 1    !! rivaroxaban (XARELTO) 20 MG TABS tablet Take 1 tablet (20 mg total) by mouth daily with supper. Qty: 30 tablet, Refills: 3     !! - Potential duplicate medications found. Please discuss with provider.    CONTINUE these medications which have NOT CHANGED   Details  acetaminophen (TYLENOL) 500 MG tablet Take 1,000 mg by mouth 3 (three) times daily as needed for moderate pain.     HYDROcodone-acetaminophen (NORCO/VICODIN) 5-325 MG tablet Take 1 tablet by mouth 3 (three) times daily as needed (pain).    methadone (DOLOPHINE) 10 MG/ML solution Take 90 mg by mouth daily.     chlorhexidine (PERIDEX) 0.12 % solution 15 mL swish and spit bid Qty: 480 mL, Refills: 0    cyclobenzaprine (FLEXERIL) 10 MG tablet Take 1 tablet (10 mg total) by mouth 2 (two) times daily as needed for muscle spasms. Qty: 20 tablet, Refills: 0    lidocaine (XYLOCAINE) 2 % solution Use as directed 10 mLs in the mouth or throat every 6 (six) hours as needed for mouth pain. Hold in mouth and spit. Do not swallow. Qty: 200 mL, Refills: 0    ondansetron (ZOFRAN ODT) 8 MG disintegrating tablet Take 1 tablet (8 mg total) by mouth every 8 (eight) hours as needed for nausea or vomiting. Qty: 20 tablet, Refills: 0  Physician Discharge Summary  [name] MRN: [id number] DOB/AGE: 03/11/1951 66 y.o.  PCP: Evette Doffing, MD   Admit date: 04/12/2016 Discharge date: 04/22/2016  Discharge Diagnoses:    Active Problems:   Cerebral embolism with cerebral infarction   Stroke (cerebrum) (HCC)   Meningitis   Acute respiratory failure (HCC)   Streptococcus pneumoniae meningitis   Streptococcal bacteremia   Septic thrombophlebitis of sagittal sinus   Fever   Left hemiparesis (HCC)   Generalized OA   Fibromyalgia   Tobacco abuse   Substance abuse   Benign essential HTN   Tachycardia   Chronic pain syndrome   Acute blood loss anemia   Leukocytosis    Follow-up recommendations Follow-up with PCP in 3-5 days , including all  additional recommended appointments as below Follow-up CBC, CMP in 3-5 days   Addendum Per case manager , patient needs to make arrangements for [facility] CANCE[facility]ED ON 2/12   Current Discharge Medication List    START taking these medications   Details  cefTRIAXone (ROCEPHIN) IVPB Inject 2 g into the vein every 12 (twelve) hours. Indication:  Pneumococcal bacteremia with septic sagittal sinus thrombosis Last Day of Therapy:  05/24/16 Labs - Once weekly:  CBC/D and BMP, Labs - Every other week:  ESR and CRP Qty: 68 Units, Refills: 0    docusate (COLACE) 50 MG/5ML liquid Place 10 mLs (100 mg total) into feeding tube 2 (two) times daily as needed for mild constipation. Qty: 100 mL, Refills: 0    feeding supplement, ENSURE ENLIVE, (ENSURE ENLIVE) LIQD Take 237 mLs by mouth 2 (two) times daily between meals. Qty: 237 mL, Refills: 12    levETIRAcetam (KEPPRA) 500 MG tablet Take 1 tablet (500 mg total) by mouth every 12 (twelve) hours. Qty: 60 tablet, Refills: 2    pantoprazole (PROTONIX) 40 MG tablet Take 1 tablet (40 mg total) by mouth at bedtime. Qty: 30 tablet, Refills: 2    !! Rivaroxaban (XARELTO) 15 MG TABS tablet Take 1 tablet (15 mg total) by mouth 2 (two)  times daily with a meal. Qty: 42 tablet, Refills: 1    !! rivaroxaban (XARELTO) 20 MG TABS tablet Take 1 tablet (20 mg total) by mouth daily with supper. Qty: 30 tablet, Refills: 3     !! - Potential duplicate medications found. Please discuss with provider.    CONTINUE these medications which have NOT CHANGED   Details  acetaminophen (TYLENOL) 500 MG tablet Take 1,000 mg by mouth 3 (three) times daily as needed for moderate pain.     HYDROcodone-acetaminophen (NORCO/VICODIN) 5-325 MG tablet Take 1 tablet by mouth 3 (three) times daily as needed (pain).    methadone (DOLOPHINE) 10 MG/ML solution Take 90 mg by mouth daily.     chlorhexidine (PERIDEX) 0.12 % solution 15 mL swish and spit bid Qty: 480 mL, Refills: 0    cyclobenzaprine (FLEXERIL) 10 MG tablet Take 1 tablet (10 mg total) by mouth 2 (two) times daily as needed for muscle spasms. Qty: 20 tablet, Refills: 0    lidocaine (XYLOCAINE) 2 % solution Use as directed 10 mLs in the mouth or throat every 6 (six) hours as needed for mouth pain. Hold in mouth and spit. Do not swallow. Qty: 200 mL, Refills: 0    ondansetron (ZOFRAN ODT) 8 MG disintegrating tablet Take 1 tablet (8 mg total) by mouth every 8 (eight) hours as needed for nausea or vomiting. Qty: 20 tablet, Refills: 0

## 2016-04-22 NOTE — Progress Notes (Signed)
I met with pt at bedside to discuss her caregiver support at home which will be needed after any rehab stay. She states her spouse works out of town and is in [place] now. Son works and is in [place]. She states she does not have 24/7 supervision at discharge which we expect she will need. We recommend SNF as per Dr. Serita Grit consult on 04/20/16. I have alerted RN CM and SW. We will sign off. [phone]

## 2016-04-22 NOTE — Care Management Note (Addendum)
Case Management Note  Patient Details  Name: [name] MRN: [id number] Date of Birth: [birth year]  Subjective/Objective:                    Action/Plan: After further discussion with the patient her sisters are not going to be available on a regular basis. Patients husband out of town until tomorrow. Pts son available for IV antibiotic training tomorrow.  Dr Marin Olp placed orders for further blood testing d/t drop in Hgb.  Pt with orders for walker. Santiago Glad with [facility] informed and had walker delivered to the room.  CM spoke to Dr Allyson Sabal and d/c held until tomorrow. CM following for probable d/c home on 2/13.  Expected Discharge Date:  04/22/16               Expected Discharge Plan:  [place]  In-House Referral:     Discharge planning Services  CM Consult  Post Acute Care Choice:  Home Health, Durable Medical Equipment Choice offered to:  Patient  DME Arranged:    DME Agency:  [place]:  RN [facility] Agency:  [place]  Status of Service:  In process, will continue to follow  If discussed at Long Length of Stay Meetings, dates discussed:    Additional Comments:  Pollie Friar, RN 04/22/2016, 7:41 PM

## 2016-04-22 NOTE — Discharge Instructions (Signed)
Information on my medicine - XARELTO (rivaroxaban)  This medication education was reviewed with me or my healthcare representative as part of my discharge preparation.  The pharmacist that spoke with me during my hospital stay was:  Wayland Salinas, [place]? Xarelto was prescribed to treat blood clots that may have been found in the veins of your legs (deep vein thrombosis) or in your lungs (pulmonary embolism) and to reduce the risk of them occurring again.  What do you need to know about Xarelto? The starting dose is one 15 mg tablet taken TWICE daily with food for the FIRST 21 DAYS then on (enter date)  05/10/2016  the dose is changed to one 20 mg tablet taken ONCE A DAY with your evening meal.  DO NOT stop taking Xarelto without talking to the health care provider who prescribed the medication.  Refill your prescription for 20 mg tablets before you run out.  After discharge, you should have regular check-up appointments with your healthcare provider that is prescribing your Xarelto.  In the future your dose may need to be changed if your kidney function changes by a significant amount.  What do you do if you miss a dose? If you are taking Xarelto TWICE DAILY and you miss a dose, take it as soon as you remember. You may take two 15 mg tablets (total 30 mg) at the same time then resume your regularly scheduled 15 mg twice daily the next day.  If you are taking Xarelto ONCE DAILY and you miss a dose, take it as soon as you remember on the same day then continue your regularly scheduled once daily regimen the next day. Do not take two doses of Xarelto at the same time.   Important Safety Information Xarelto is a blood thinner medicine that can cause bleeding. You should call your healthcare provider right away if you experience any of the following: ? Bleeding from an injury or your nose that does not stop. ? Unusual colored urine (red or  dark brown) or unusual colored stools (red or black). ? Unusual bruising for unknown reasons. ? A serious fall or if you hit your head (even if there is no bleeding).  Some medicines may interact with Xarelto and might increase your risk of bleeding while on Xarelto. To help avoid this, consult your healthcare provider or pharmacist prior to using any new prescription or non-prescription medications, including herbals, vitamins, non-steroidal anti-inflammatory drugs (NSAIDs) and supplements.  This website has more information on Xarelto: [email or web address]

## 2016-04-22 NOTE — Progress Notes (Signed)
Physical Therapy Treatment Patient Details Name: [name] MRN: [id number] DOB: May 22, 1950 Today's Date: 04/22/2016    History of Present Illness 66 year old female admitted with L sided weakness. Dx of strep pneumonia meningitis and septic superior sagittal venous sinus thrombosis, VDRF 2/5-04/17/16.  PMH: chronic neck pain, L eye paralysis, fibromyalgia.    PT Comments    Pt expressed difficulty with exercises due to high levels of pain, limiting her performance today. SPTA was able to add some LE strengthening exercises and said she would try to do these more throughout the day when she wasn't in as much pain. Pt will continue to benefit from ambulation and LE exercises to progress strength and exercises tolerance. PT recommending CIR to increase safety and functional independence prior to going home with husband..   Follow Up Recommendations  CIR;Other (comment) (assistance for mobility)     Equipment Recommendations  Rolling walker with 5" wheels    Recommendations for Other Services       Precautions / Restrictions Precautions Precautions: Fall Restrictions Weight Bearing Restrictions: No    Mobility  Bed Mobility               General bed mobility comments: pt sitting up in chair upon arrival  Transfers Overall transfer level: Needs assistance Equipment used: Rolling walker (2 wheeled) Transfers: Sit to/from [facility] Sit to Stand: Min guard Stand pivot transfers: Min guard       General transfer comment: pt able to power up with no cuing required. Min guard for safety and cuing for upright posture in standing.   Ambulation/Gait Ambulation/Gait assistance: Min guard Ambulation Distance (Feet): 40 Feet Assistive device: Rolling walker (2 wheeled) Gait Pattern/deviations: Decreased dorsiflexion - left;Step-to pattern;Trunk flexed;Antalgic;Decreased stance time - left;Decreased weight shift to left;Decreased stride length     General  Gait Details: Pt unable to tolerate longer distance due to high levels of pain.  Min guard for cuing of proper RW use for safety. Unable to heel strike with cuing due to limited DF and knee extension; presented with heavy limp   Stairs            Wheelchair Mobility    Modified Rankin (Stroke Patients Only)       Balance Overall balance assessment: Needs assistance Sitting-balance support: Bilateral upper extremity supported;Feet supported Sitting balance-Leahy Scale: Poor     Standing balance support: Bilateral upper extremity supported Standing balance-Leahy Scale: Poor Standing balance comment: pt relies on RW for support                    Cognition Arousal/Alertness: Awake/alert Behavior During Therapy: WFL for tasks assessed/performed Overall Cognitive Status: Within Functional Limits for tasks assessed                      Exercises Total Joint Exercises Ankle Circles/Pumps: Right;10 reps;Seated (pt refused to do left due to increased pain) Long Arc Quad: Both;10 reps;Seated General Exercises - Lower Extremity Hip Flexion/Marching: Both;10 reps;Seated Toe Raises: Both;10 reps;Seated Heel Raises: Both;10 reps;Seated    General Comments        Pertinent Vitals/Pain Pain Assessment: 0-10 Pain Score: 9  Pain Location: head and LLE Pain Descriptors / Indicators: Constant;Aching;Throbbing Pain Intervention(s): Monitored during session;Repositioned;Patient requesting pain meds-RN notified    Home Living                      Prior Function  PT Goals (current goals can now be found in the care plan section) Acute Rehab PT Goals Patient Stated Goal: wants to go home with her dogs PT Goal Formulation: With patient Potential to Achieve Goals: Good Progress towards PT goals: Progressing toward goals    Frequency    Min 3X/week      PT Plan Current plan remains appropriate    Co-evaluation             End  of Session Equipment Utilized During Treatment: Gait belt Activity Tolerance: Patient limited by pain;Patient limited by fatigue Patient left: in chair;with call bell/phone within reach;with chair alarm set     Time: 1030-1054 PT Time Calculation (min) (ACUTE ONLY): 24 min  Charges:  $Gait Training: 8-22 mins $Therapeutic Exercise: 8-22 mins                    G Codes:      [facility] 05/18/2016, 11:22 AM Olena [place], [place] Pager [phone]

## 2016-04-23 DIAGNOSIS — D509 Iron deficiency anemia, unspecified: Secondary | ICD-10-CM

## 2016-04-23 DIAGNOSIS — I69354 Hemiplegia and hemiparesis following cerebral infarction affecting left non-dominant side: Secondary | ICD-10-CM | POA: Diagnosis not present

## 2016-04-23 DIAGNOSIS — G001 Pneumococcal meningitis: Secondary | ICD-10-CM | POA: Diagnosis not present

## 2016-04-23 DIAGNOSIS — F17210 Nicotine dependence, cigarettes, uncomplicated: Secondary | ICD-10-CM | POA: Diagnosis not present

## 2016-04-23 DIAGNOSIS — M150 Primary generalized (osteo)arthritis: Secondary | ICD-10-CM | POA: Diagnosis not present

## 2016-04-23 DIAGNOSIS — I10 Essential (primary) hypertension: Secondary | ICD-10-CM | POA: Diagnosis not present

## 2016-04-23 DIAGNOSIS — G894 Chronic pain syndrome: Secondary | ICD-10-CM | POA: Diagnosis not present

## 2016-04-23 DIAGNOSIS — G9340 Encephalopathy, unspecified: Secondary | ICD-10-CM

## 2016-04-23 LAB — CBC WITH DIFFERENTIAL/PLATELET
Basophils Absolute: 0 10*3/uL (ref 0.0–0.1)
Basophils Relative: 0 %
Eosinophils Absolute: 0 10*3/uL (ref 0.0–0.7)
Eosinophils Relative: 0 %
HCT: 24.8 % — ABNORMAL LOW (ref 36.0–46.0)
Hemoglobin: 8.1 g/dL — ABNORMAL LOW (ref 12.0–15.0)
Lymphocytes Relative: 21 %
Lymphs Abs: 2.2 10*3/uL (ref 0.7–4.0)
MCH: 31.5 pg (ref 26.0–34.0)
MCHC: 32.7 g/dL (ref 30.0–36.0)
MCV: 96.5 fL (ref 78.0–100.0)
Monocytes Absolute: 0.9 10*3/uL (ref 0.1–1.0)
Monocytes Relative: 9 %
Neutro Abs: 7.3 10*3/uL (ref 1.7–7.7)
Neutrophils Relative %: 70 %
Platelets: 393 10*3/uL (ref 150–400)
RBC: 2.57 MIL/uL — ABNORMAL LOW (ref 3.87–5.11)
RDW: 13.9 % (ref 11.5–15.5)
WBC: 10.5 10*3/uL (ref 4.0–10.5)

## 2016-04-23 LAB — RETICULOCYTES
RBC.: 2.57 MIL/uL — ABNORMAL LOW (ref 3.87–5.11)
Retic Count, Absolute: 59.1 10*3/uL (ref 19.0–186.0)
Retic Ct Pct: 2.3 % (ref 0.4–3.1)

## 2016-04-23 LAB — COMPREHENSIVE METABOLIC PANEL WITH GFR
ALT: 60 U/L — ABNORMAL HIGH (ref 14–54)
AST: 51 U/L — ABNORMAL HIGH (ref 15–41)
Albumin: 2.3 g/dL — ABNORMAL LOW (ref 3.5–5.0)
Alkaline Phosphatase: 149 U/L — ABNORMAL HIGH (ref 38–126)
Anion gap: 9 (ref 5–15)
BUN: 8 mg/dL (ref 6–20)
CO2: 26 mmol/L (ref 22–32)
Calcium: 8.7 mg/dL — ABNORMAL LOW (ref 8.9–10.3)
Chloride: 102 mmol/L (ref 101–111)
Creatinine, Ser: 0.68 mg/dL (ref 0.44–1.00)
GFR calc Af Amer: >60 mL/min
GFR calc non Af Amer: >60 mL/min
Glucose, Bld: 136 mg/dL — ABNORMAL HIGH (ref 65–99)
Potassium: 3.4 mmol/L — ABNORMAL LOW (ref 3.5–5.1)
Sodium: 137 mmol/L (ref 135–145)
Total Bilirubin: 0.4 mg/dL (ref 0.3–1.2)
Total Protein: 5.4 g/dL — ABNORMAL LOW (ref 6.5–8.1)

## 2016-04-23 LAB — IRON AND TIBC
Iron: 18 ug/dL — ABNORMAL LOW (ref 28–170)
Saturation Ratios: 8 % — ABNORMAL LOW (ref 10.4–31.8)
TIBC: 225 ug/dL — ABNORMAL LOW (ref 250–450)
UIBC: 207 ug/dL

## 2016-04-23 LAB — FERRITIN: Ferritin: 335 ng/mL — ABNORMAL HIGH (ref 11–307)

## 2016-04-23 MED ORDER — SODIUM CHLORIDE 0.9 % IV SOLN
510.0000 mg | Freq: Once | INTRAVENOUS | Status: AC
  Administered 2016-04-23: 510 mg via INTRAVENOUS
  Filled 2016-04-23: qty 17

## 2016-04-23 MED ORDER — RIVAROXABAN (XARELTO) VTE STARTER PACK (15 & 20 MG): ORAL_TABLET | ORAL | 0 refills | Status: DC

## 2016-04-23 MED ORDER — HEPARIN SOD (PORK) LOCK FLUSH 100 UNIT/ML IV SOLN
250.0000 [IU] | INTRAVENOUS | Status: AC | PRN
  Administered 2016-04-23: 250 [IU]

## 2016-04-23 NOTE — Progress Notes (Signed)
Patient is discharged from room 5M05 at this time. Alert and in stable condition. IV site d/c'd and instructions read to patient and son with understanding verbalized. Left unit via wheelchair with all belongings at side.

## 2016-04-23 NOTE — Progress Notes (Signed)
Patient seen and examined at bedside. Spoke with case management, patient prefers to go home and says her husband can help as well as her sisters will help take care of her. Please refer to discharge summary completed 04/22/2016. No changes in medications since then.  [name] [facility] [id number]

## 2016-04-23 NOTE — Progress Notes (Signed)
Ms. Saliyah Gillin Minium hemoglobin has dropped a little bit more. It is  8.1 this morning. She clearly has iron deficiency. Her ferritin is 335 but her iron saturation is only 8%.  Her electrolytes look okay. Her potassium is 3.4. Her sodium is 137. Her calcium is 8.7 with an albumin of 2.3.  I have believed that she is probably bleeding from the GI tract. She had heme positive stools back in 2008. They need to be rechecked. If they are positive, I probably would recommend an upper and lower endoscopy on her. I'm not sure when she had her last one done.  I will go ahead and give her a dose of IV iron.  Her blood pressure is doing okay. It is 113/57. Her temperature is 97.8.  If her blood and she is to drop, then I suspect she might need some actual packed red blood cells.  With her being on blood thinner, I think we have to make sure that there is no bleeding that is causing the like count to drop.  We will continue to follow along.  Lattie Haw, MD  1 Corinthians 13:4

## 2016-04-23 NOTE — Care Management Important Message (Signed)
Important Message  Patient Details  Name: [name] MRN: [id number] Date of Birth: Jun 08, 1950   Medicare Important Message Given:  Yes    Ciarrah Rae Montine Circle 04/23/2016, 12:37 PM

## 2016-04-23 NOTE — Care Management Note (Signed)
Case Management Note  Patient Details  Name: [name] MRN: [id number] Date of Birth: [birth year]  Subjective/Objective:                    Action/Plan: Pt discharging home with [facility] services. Pts son in room with patient and going to provide transportation home. Pam went over with patients son the instructions of administering the IV antibiotics. Patients husband is home from his job and is able to support her at home per pts son. Pt has walker for home.    Expected Discharge Date:  04/22/16               Expected Discharge Plan:  [place]  In-House Referral:     Discharge planning Services  CM Consult  Post Acute Care Choice:  Home Health, Durable Medical Equipment Choice offered to:  Patient  DME Arranged:    DME Agency:  [place] Arranged:  RN [facility] Agency:  [place]  Status of Service:  Completed, signed off  If discussed at [place] of Stay Meetings, dates discussed:    Additional Comments:  Pollie Friar, RN 04/23/2016, 1:20 PM

## 2016-04-24 ENCOUNTER — Telehealth: Payer: Self-pay | Admitting: Internal Medicine

## 2016-04-24 DIAGNOSIS — G001 Pneumococcal meningitis: Secondary | ICD-10-CM | POA: Diagnosis not present

## 2016-04-24 DIAGNOSIS — F17210 Nicotine dependence, cigarettes, uncomplicated: Secondary | ICD-10-CM | POA: Diagnosis not present

## 2016-04-24 DIAGNOSIS — I69354 Hemiplegia and hemiparesis following cerebral infarction affecting left non-dominant side: Secondary | ICD-10-CM | POA: Diagnosis not present

## 2016-04-24 DIAGNOSIS — G894 Chronic pain syndrome: Secondary | ICD-10-CM | POA: Diagnosis not present

## 2016-04-24 DIAGNOSIS — I10 Essential (primary) hypertension: Secondary | ICD-10-CM | POA: Diagnosis not present

## 2016-04-24 DIAGNOSIS — M150 Primary generalized (osteo)arthritis: Secondary | ICD-10-CM | POA: Diagnosis not present

## 2016-04-24 NOTE — Telephone Encounter (Signed)
Will forward to MD to give what verbal orders patient will need and we can call over and give them to [facility].  Si Jachim,CMA

## 2016-04-24 NOTE — Telephone Encounter (Signed)
[name] is calling again, states pt is in a bad situation at home and needs to get a Education officer, museum out there ASAP. ep

## 2016-04-24 NOTE — Telephone Encounter (Signed)
Heather, [place] with [facility] called needing verbal order for social worker.  Patient in need of resources for help purchase medications, get food for the home and patient does not have a reliable caregiver. Please call [phone]. Derl Barrow, RN

## 2016-04-24 NOTE — Telephone Encounter (Signed)
Patty from [facility] needs nursing orders for pt. Pt was discharged from the hospital yesterday. ep

## 2016-04-24 NOTE — Telephone Encounter (Signed)
Okay for patient to be seen by Education officer, museum. We have already spoken with Patty earlier today and given verbal okay for nursing evaluation, PT/OT, and social worker.

## 2016-04-24 NOTE — Telephone Encounter (Signed)
Spoke with [employer] and verbal ok given for nursing evaluation, PT/OT and social work.  They will fax orders to provider to sign. Taunja Brickner,CMA

## 2016-04-24 NOTE — Telephone Encounter (Signed)
Okay for nursing evaluation.

## 2016-04-25 ENCOUNTER — Telehealth: Payer: Self-pay | Admitting: *Deleted

## 2016-04-25 NOTE — Telephone Encounter (Signed)
Spoke with Nira Conn and informed her that the orders were already given to patty earlier in the day. Chealsey Miyamoto,CMA

## 2016-04-25 NOTE — Telephone Encounter (Signed)
Clair Gulling, Physical Therapist with [facility] called to request physical therapy for fall risk reduction and functional mobility.  Recommend twice a week for two weeks and once a week for two weeks. Please call [phone].  Derl Barrow, RN

## 2016-04-26 ENCOUNTER — Telehealth: Payer: Self-pay

## 2016-04-26 DIAGNOSIS — M150 Primary generalized (osteo)arthritis: Secondary | ICD-10-CM | POA: Diagnosis not present

## 2016-04-26 DIAGNOSIS — I69354 Hemiplegia and hemiparesis following cerebral infarction affecting left non-dominant side: Secondary | ICD-10-CM | POA: Diagnosis not present

## 2016-04-26 DIAGNOSIS — G001 Pneumococcal meningitis: Secondary | ICD-10-CM | POA: Diagnosis not present

## 2016-04-26 DIAGNOSIS — I10 Essential (primary) hypertension: Secondary | ICD-10-CM | POA: Diagnosis not present

## 2016-04-26 DIAGNOSIS — G894 Chronic pain syndrome: Secondary | ICD-10-CM | POA: Diagnosis not present

## 2016-04-26 DIAGNOSIS — F17210 Nicotine dependence, cigarettes, uncomplicated: Secondary | ICD-10-CM | POA: Diagnosis not present

## 2016-04-26 NOTE — Telephone Encounter (Signed)
Okay for [facility] to check on patient.

## 2016-04-26 NOTE — Telephone Encounter (Signed)
Spoke with Clair Gulling and him the verbal ok for his recommended orders. Benney [place],CMA

## 2016-04-26 NOTE — Telephone Encounter (Signed)
Okay to receive physical therapy

## 2016-04-26 NOTE — Telephone Encounter (Signed)
LM for Ebony Hail on confidential VM with ok to go out and check on patient again.  Will forward to MD.  We discussed giving verbal ok in provider's absence while on vacation for home health orders for this patient. Vidyuth Belsito,CMA
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
## 2016-04-29 DIAGNOSIS — I69354 Hemiplegia and hemiparesis following cerebral infarction affecting left non-dominant side: Secondary | ICD-10-CM | POA: Diagnosis not present

## 2016-04-29 DIAGNOSIS — G001 Pneumococcal meningitis: Secondary | ICD-10-CM | POA: Diagnosis not present

## 2016-04-29 DIAGNOSIS — F17210 Nicotine dependence, cigarettes, uncomplicated: Secondary | ICD-10-CM | POA: Diagnosis not present

## 2016-04-29 DIAGNOSIS — G894 Chronic pain syndrome: Secondary | ICD-10-CM | POA: Diagnosis not present

## 2016-04-29 DIAGNOSIS — M150 Primary generalized (osteo)arthritis: Secondary | ICD-10-CM | POA: Diagnosis not present

## 2016-04-29 DIAGNOSIS — Z792 Long term (current) use of antibiotics: Secondary | ICD-10-CM | POA: Diagnosis not present

## 2016-04-29 DIAGNOSIS — I10 Essential (primary) hypertension: Secondary | ICD-10-CM | POA: Diagnosis not present

## 2016-04-30 DIAGNOSIS — I69354 Hemiplegia and hemiparesis following cerebral infarction affecting left non-dominant side: Secondary | ICD-10-CM | POA: Diagnosis not present

## 2016-04-30 DIAGNOSIS — G001 Pneumococcal meningitis: Secondary | ICD-10-CM | POA: Diagnosis not present

## 2016-04-30 DIAGNOSIS — G894 Chronic pain syndrome: Secondary | ICD-10-CM | POA: Diagnosis not present

## 2016-04-30 DIAGNOSIS — M150 Primary generalized (osteo)arthritis: Secondary | ICD-10-CM | POA: Diagnosis not present

## 2016-04-30 DIAGNOSIS — I10 Essential (primary) hypertension: Secondary | ICD-10-CM | POA: Diagnosis not present

## 2016-04-30 DIAGNOSIS — F17210 Nicotine dependence, cigarettes, uncomplicated: Secondary | ICD-10-CM | POA: Diagnosis not present

## 2016-05-04 ENCOUNTER — Other Ambulatory Visit: Payer: Self-pay | Admitting: Internal Medicine

## 2016-05-04 ENCOUNTER — Telehealth: Payer: Self-pay | Admitting: Internal Medicine

## 2016-05-04 DIAGNOSIS — I69354 Hemiplegia and hemiparesis following cerebral infarction affecting left non-dominant side: Secondary | ICD-10-CM | POA: Diagnosis not present

## 2016-05-04 DIAGNOSIS — G001 Pneumococcal meningitis: Secondary | ICD-10-CM | POA: Diagnosis not present

## 2016-05-04 NOTE — Telephone Encounter (Signed)
Home health called regarding this patient and concern with oral thrush.  I called in 7 days of fluconazole to [facility] [place].   Scharlene Gloss, MD

## 2016-05-05 DIAGNOSIS — R000 Tachycardia, unspecified: Secondary | ICD-10-CM | POA: Diagnosis not present

## 2016-05-05 DIAGNOSIS — I10 Essential (primary) hypertension: Secondary | ICD-10-CM | POA: Diagnosis not present

## 2016-05-05 DIAGNOSIS — R1031 Right lower quadrant pain: Secondary | ICD-10-CM | POA: Diagnosis not present

## 2016-05-05 DIAGNOSIS — R1013 Epigastric pain: Secondary | ICD-10-CM | POA: Diagnosis not present

## 2016-05-05 DIAGNOSIS — I69354 Hemiplegia and hemiparesis following cerebral infarction affecting left non-dominant side: Secondary | ICD-10-CM | POA: Diagnosis not present

## 2016-05-05 DIAGNOSIS — G001 Pneumococcal meningitis: Secondary | ICD-10-CM | POA: Diagnosis not present

## 2016-05-05 DIAGNOSIS — D72829 Elevated white blood cell count, unspecified: Secondary | ICD-10-CM | POA: Diagnosis not present

## 2016-05-05 DIAGNOSIS — R110 Nausea: Secondary | ICD-10-CM | POA: Diagnosis not present

## 2016-05-05 DIAGNOSIS — F17210 Nicotine dependence, cigarettes, uncomplicated: Secondary | ICD-10-CM | POA: Diagnosis not present

## 2016-05-05 DIAGNOSIS — Z8673 Personal history of transient ischemic attack (TIA), and cerebral infarction without residual deficits: Secondary | ICD-10-CM | POA: Diagnosis not present

## 2016-05-05 DIAGNOSIS — R531 Weakness: Secondary | ICD-10-CM | POA: Diagnosis not present

## 2016-05-05 DIAGNOSIS — K862 Cyst of pancreas: Secondary | ICD-10-CM | POA: Diagnosis not present

## 2016-05-06 ENCOUNTER — Encounter: Payer: Self-pay | Admitting: Internal Medicine

## 2016-05-06 DIAGNOSIS — G001 Pneumococcal meningitis: Secondary | ICD-10-CM | POA: Diagnosis not present

## 2016-05-06 DIAGNOSIS — I69354 Hemiplegia and hemiparesis following cerebral infarction affecting left non-dominant side: Secondary | ICD-10-CM | POA: Diagnosis not present

## 2016-05-06 DIAGNOSIS — G009 Bacterial meningitis, unspecified: Secondary | ICD-10-CM | POA: Diagnosis not present

## 2016-05-06 NOTE — Progress Notes (Signed)
Late entry for missed modified Rankin Score.  Based on review of medical record.    04/22/16 1115  Modified Rankin (Stroke Patients Only)  Pre-Morbid Rankin Score 0  Modified Rankin [place], [place]  [phone] 05/06/2016

## 2016-05-07 DIAGNOSIS — I69354 Hemiplegia and hemiparesis following cerebral infarction affecting left non-dominant side: Secondary | ICD-10-CM | POA: Diagnosis not present

## 2016-05-07 DIAGNOSIS — G001 Pneumococcal meningitis: Secondary | ICD-10-CM | POA: Diagnosis not present

## 2016-05-08 DIAGNOSIS — I69354 Hemiplegia and hemiparesis following cerebral infarction affecting left non-dominant side: Secondary | ICD-10-CM | POA: Diagnosis not present

## 2016-05-08 DIAGNOSIS — G001 Pneumococcal meningitis: Secondary | ICD-10-CM | POA: Diagnosis not present

## 2016-05-09 DIAGNOSIS — I69354 Hemiplegia and hemiparesis following cerebral infarction affecting left non-dominant side: Secondary | ICD-10-CM | POA: Diagnosis not present

## 2016-05-09 DIAGNOSIS — G001 Pneumococcal meningitis: Secondary | ICD-10-CM | POA: Diagnosis not present

## 2016-05-13 DIAGNOSIS — I69354 Hemiplegia and hemiparesis following cerebral infarction affecting left non-dominant side: Secondary | ICD-10-CM | POA: Diagnosis not present

## 2016-05-13 DIAGNOSIS — G001 Pneumococcal meningitis: Secondary | ICD-10-CM | POA: Diagnosis not present

## 2016-05-14 DIAGNOSIS — Z87898 Personal history of other specified conditions: Secondary | ICD-10-CM | POA: Diagnosis not present

## 2016-05-14 DIAGNOSIS — Z8673 Personal history of transient ischemic attack (TIA), and cerebral infarction without residual deficits: Secondary | ICD-10-CM | POA: Diagnosis not present

## 2016-05-14 DIAGNOSIS — K219 Gastro-esophageal reflux disease without esophagitis: Secondary | ICD-10-CM | POA: Diagnosis not present

## 2016-05-14 DIAGNOSIS — H532 Diplopia: Secondary | ICD-10-CM | POA: Diagnosis not present

## 2016-05-14 DIAGNOSIS — R509 Fever, unspecified: Secondary | ICD-10-CM | POA: Diagnosis not present

## 2016-05-14 DIAGNOSIS — R531 Weakness: Secondary | ICD-10-CM | POA: Diagnosis not present

## 2016-05-14 DIAGNOSIS — R109 Unspecified abdominal pain: Secondary | ICD-10-CM | POA: Diagnosis not present

## 2016-05-14 DIAGNOSIS — Z681 Body mass index (BMI) 19 or less, adult: Secondary | ICD-10-CM | POA: Diagnosis not present

## 2016-05-14 DIAGNOSIS — G8929 Other chronic pain: Secondary | ICD-10-CM | POA: Diagnosis not present

## 2016-05-14 DIAGNOSIS — F17210 Nicotine dependence, cigarettes, uncomplicated: Secondary | ICD-10-CM | POA: Diagnosis present

## 2016-05-14 DIAGNOSIS — G039 Meningitis, unspecified: Secondary | ICD-10-CM | POA: Diagnosis not present

## 2016-05-14 DIAGNOSIS — I4891 Unspecified atrial fibrillation: Secondary | ICD-10-CM | POA: Diagnosis not present

## 2016-05-14 DIAGNOSIS — K922 Gastrointestinal hemorrhage, unspecified: Secondary | ICD-10-CM | POA: Diagnosis not present

## 2016-05-14 DIAGNOSIS — I10 Essential (primary) hypertension: Secondary | ICD-10-CM | POA: Diagnosis not present

## 2016-05-14 DIAGNOSIS — M797 Fibromyalgia: Secondary | ICD-10-CM | POA: Diagnosis not present

## 2016-05-14 DIAGNOSIS — K921 Melena: Secondary | ICD-10-CM | POA: Diagnosis not present

## 2016-05-14 DIAGNOSIS — Z95828 Presence of other vascular implants and grafts: Secondary | ICD-10-CM | POA: Diagnosis not present

## 2016-05-14 DIAGNOSIS — H4902 Third [oculomotor] nerve palsy, left eye: Secondary | ICD-10-CM | POA: Diagnosis present

## 2016-05-14 DIAGNOSIS — D72829 Elevated white blood cell count, unspecified: Secondary | ICD-10-CM | POA: Diagnosis not present

## 2016-05-14 DIAGNOSIS — E876 Hypokalemia: Secondary | ICD-10-CM | POA: Diagnosis not present

## 2016-05-14 DIAGNOSIS — D62 Acute posthemorrhagic anemia: Secondary | ICD-10-CM | POA: Diagnosis not present

## 2016-05-14 DIAGNOSIS — R1031 Right lower quadrant pain: Secondary | ICD-10-CM | POA: Diagnosis not present

## 2016-05-14 DIAGNOSIS — G40909 Epilepsy, unspecified, not intractable, without status epilepticus: Secondary | ICD-10-CM | POA: Diagnosis not present

## 2016-05-14 DIAGNOSIS — R1013 Epigastric pain: Secondary | ICD-10-CM | POA: Diagnosis not present

## 2016-05-14 DIAGNOSIS — G009 Bacterial meningitis, unspecified: Secondary | ICD-10-CM | POA: Diagnosis not present

## 2016-05-14 DIAGNOSIS — R29898 Other symptoms and signs involving the musculoskeletal system: Secondary | ICD-10-CM | POA: Diagnosis not present

## 2016-05-14 DIAGNOSIS — G939 Disorder of brain, unspecified: Secondary | ICD-10-CM | POA: Diagnosis not present

## 2016-05-14 DIAGNOSIS — M899 Disorder of bone, unspecified: Secondary | ICD-10-CM | POA: Diagnosis not present

## 2016-05-14 DIAGNOSIS — R7881 Bacteremia: Secondary | ICD-10-CM | POA: Diagnosis not present

## 2016-05-14 DIAGNOSIS — D509 Iron deficiency anemia, unspecified: Secondary | ICD-10-CM | POA: Diagnosis not present

## 2016-05-14 DIAGNOSIS — Z452 Encounter for adjustment and management of vascular access device: Secondary | ICD-10-CM | POA: Diagnosis not present

## 2016-05-14 DIAGNOSIS — E440 Moderate protein-calorie malnutrition: Secondary | ICD-10-CM | POA: Diagnosis not present

## 2016-05-14 DIAGNOSIS — D649 Anemia, unspecified: Secondary | ICD-10-CM | POA: Diagnosis not present

## 2016-05-14 DIAGNOSIS — F1191 Opioid use, unspecified, in remission: Secondary | ICD-10-CM | POA: Insufficient documentation

## 2016-05-14 DIAGNOSIS — M1990 Unspecified osteoarthritis, unspecified site: Secondary | ICD-10-CM | POA: Diagnosis present

## 2016-05-14 DIAGNOSIS — G001 Pneumococcal meningitis: Secondary | ICD-10-CM | POA: Diagnosis not present

## 2016-05-14 DIAGNOSIS — G08 Intracranial and intraspinal phlebitis and thrombophlebitis: Secondary | ICD-10-CM | POA: Diagnosis not present

## 2016-05-15 DIAGNOSIS — G009 Bacterial meningitis, unspecified: Secondary | ICD-10-CM | POA: Insufficient documentation

## 2016-05-17 DIAGNOSIS — M899 Disorder of bone, unspecified: Secondary | ICD-10-CM | POA: Diagnosis not present

## 2016-05-23 ENCOUNTER — Inpatient Hospital Stay: Payer: Medicare Other | Admitting: Infectious Disease

## 2016-05-23 DIAGNOSIS — Z86718 Personal history of other venous thrombosis and embolism: Secondary | ICD-10-CM | POA: Insufficient documentation

## 2016-05-23 DIAGNOSIS — H4902 Third [oculomotor] nerve palsy, left eye: Secondary | ICD-10-CM | POA: Insufficient documentation

## 2016-05-23 DIAGNOSIS — D509 Iron deficiency anemia, unspecified: Secondary | ICD-10-CM | POA: Insufficient documentation

## 2016-05-24 DIAGNOSIS — I69354 Hemiplegia and hemiparesis following cerebral infarction affecting left non-dominant side: Secondary | ICD-10-CM | POA: Diagnosis not present

## 2016-05-24 DIAGNOSIS — G001 Pneumococcal meningitis: Secondary | ICD-10-CM | POA: Diagnosis not present

## 2016-05-28 ENCOUNTER — Telehealth: Payer: Self-pay

## 2016-05-28 DIAGNOSIS — E440 Moderate protein-calorie malnutrition: Secondary | ICD-10-CM | POA: Diagnosis not present

## 2016-05-28 DIAGNOSIS — Z79899 Other long term (current) drug therapy: Secondary | ICD-10-CM | POA: Diagnosis not present

## 2016-05-28 DIAGNOSIS — D649 Anemia, unspecified: Secondary | ICD-10-CM | POA: Diagnosis not present

## 2016-05-28 DIAGNOSIS — R600 Localized edema: Secondary | ICD-10-CM | POA: Diagnosis not present

## 2016-05-28 DIAGNOSIS — G002 Streptococcal meningitis: Secondary | ICD-10-CM | POA: Diagnosis not present

## 2016-05-28 DIAGNOSIS — R000 Tachycardia, unspecified: Secondary | ICD-10-CM | POA: Diagnosis not present

## 2016-05-28 DIAGNOSIS — R569 Unspecified convulsions: Secondary | ICD-10-CM | POA: Diagnosis not present

## 2016-05-28 NOTE — Telephone Encounter (Signed)
[facility]  Nurse calling for orders to discontinue PICC since IV antibiotics have been completed.  Patient was admitted to hospital during date of [place].  Pager number for Dr Baxter Flattery given since there is no documentation related to recent hospital admission.   Laverle Patter, RN

## 2016-05-29 DIAGNOSIS — I69354 Hemiplegia and hemiparesis following cerebral infarction affecting left non-dominant side: Secondary | ICD-10-CM | POA: Diagnosis not present

## 2016-05-29 DIAGNOSIS — G001 Pneumococcal meningitis: Secondary | ICD-10-CM | POA: Diagnosis not present

## 2016-06-04 DIAGNOSIS — I69354 Hemiplegia and hemiparesis following cerebral infarction affecting left non-dominant side: Secondary | ICD-10-CM | POA: Diagnosis not present

## 2016-06-04 DIAGNOSIS — G001 Pneumococcal meningitis: Secondary | ICD-10-CM | POA: Diagnosis not present

## 2016-06-05 DIAGNOSIS — I69354 Hemiplegia and hemiparesis following cerebral infarction affecting left non-dominant side: Secondary | ICD-10-CM | POA: Diagnosis not present

## 2016-06-05 DIAGNOSIS — G001 Pneumococcal meningitis: Secondary | ICD-10-CM | POA: Diagnosis not present

## 2016-06-06 DIAGNOSIS — D649 Anemia, unspecified: Secondary | ICD-10-CM | POA: Diagnosis not present

## 2016-06-06 DIAGNOSIS — R300 Dysuria: Secondary | ICD-10-CM | POA: Diagnosis not present

## 2016-06-06 DIAGNOSIS — M792 Neuralgia and neuritis, unspecified: Secondary | ICD-10-CM | POA: Diagnosis not present

## 2016-06-06 DIAGNOSIS — D2230 Melanocytic nevi of unspecified part of face: Secondary | ICD-10-CM | POA: Diagnosis not present

## 2016-06-06 DIAGNOSIS — D485 Neoplasm of uncertain behavior of skin: Secondary | ICD-10-CM | POA: Diagnosis not present

## 2016-06-06 DIAGNOSIS — R062 Wheezing: Secondary | ICD-10-CM | POA: Diagnosis not present

## 2016-06-06 DIAGNOSIS — C44311 Basal cell carcinoma of skin of nose: Secondary | ICD-10-CM | POA: Diagnosis not present

## 2016-06-06 DIAGNOSIS — R7989 Other specified abnormal findings of blood chemistry: Secondary | ICD-10-CM | POA: Diagnosis not present

## 2016-06-06 DIAGNOSIS — R0989 Other specified symptoms and signs involving the circulatory and respiratory systems: Secondary | ICD-10-CM | POA: Diagnosis not present

## 2016-06-07 DIAGNOSIS — G001 Pneumococcal meningitis: Secondary | ICD-10-CM | POA: Diagnosis not present

## 2016-06-07 DIAGNOSIS — I69354 Hemiplegia and hemiparesis following cerebral infarction affecting left non-dominant side: Secondary | ICD-10-CM | POA: Diagnosis not present

## 2016-06-18 ENCOUNTER — Encounter (HOSPITAL_COMMUNITY): Payer: Self-pay

## 2016-06-18 ENCOUNTER — Emergency Department (HOSPITAL_COMMUNITY)
Admission: EM | Admit: 2016-06-18 | Discharge: 2016-06-18 | Disposition: A | Discharge status: Home / Self Care | Payer: Medicare Other | Attending: Emergency Medicine | Admitting: Emergency Medicine

## 2016-06-18 ENCOUNTER — Ambulatory Visit (INDEPENDENT_AMBULATORY_CARE_PROVIDER_SITE_OTHER): Payer: Medicare Other | Admitting: Family Medicine

## 2016-06-18 ENCOUNTER — Emergency Department (HOSPITAL_COMMUNITY): Payer: Medicare Other

## 2016-06-18 VITALS — BP 137/86 | HR 106 | Temp 98.3°F | Resp 18 | Ht 64.0 in | Wt 108.0 lb

## 2016-06-18 DIAGNOSIS — R000 Tachycardia, unspecified: Secondary | ICD-10-CM

## 2016-06-18 DIAGNOSIS — R0602 Shortness of breath: Secondary | ICD-10-CM | POA: Diagnosis not present

## 2016-06-18 DIAGNOSIS — M542 Cervicalgia: Secondary | ICD-10-CM | POA: Diagnosis not present

## 2016-06-18 DIAGNOSIS — Z79899 Other long term (current) drug therapy: Secondary | ICD-10-CM | POA: Diagnosis not present

## 2016-06-18 DIAGNOSIS — I639 Cerebral infarction, unspecified: Secondary | ICD-10-CM | POA: Diagnosis not present

## 2016-06-18 DIAGNOSIS — I10 Essential (primary) hypertension: Secondary | ICD-10-CM | POA: Insufficient documentation

## 2016-06-18 DIAGNOSIS — J45909 Unspecified asthma, uncomplicated: Secondary | ICD-10-CM | POA: Insufficient documentation

## 2016-06-18 DIAGNOSIS — R5383 Other fatigue: Secondary | ICD-10-CM | POA: Diagnosis not present

## 2016-06-18 DIAGNOSIS — Z8673 Personal history of transient ischemic attack (TIA), and cerebral infarction without residual deficits: Secondary | ICD-10-CM | POA: Diagnosis not present

## 2016-06-18 DIAGNOSIS — F17210 Nicotine dependence, cigarettes, uncomplicated: Secondary | ICD-10-CM | POA: Insufficient documentation

## 2016-06-18 DIAGNOSIS — Z8661 Personal history of infections of the central nervous system: Secondary | ICD-10-CM

## 2016-06-18 DIAGNOSIS — R6883 Chills (without fever): Secondary | ICD-10-CM | POA: Diagnosis not present

## 2016-06-18 DIAGNOSIS — K922 Gastrointestinal hemorrhage, unspecified: Secondary | ICD-10-CM | POA: Diagnosis not present

## 2016-06-18 DIAGNOSIS — R0600 Dyspnea, unspecified: Secondary | ICD-10-CM | POA: Diagnosis not present

## 2016-06-18 DIAGNOSIS — M791 Myalgia: Secondary | ICD-10-CM | POA: Diagnosis present

## 2016-06-18 LAB — CBC WITH DIFFERENTIAL/PLATELET
Basophils Absolute: 0 10*3/uL (ref 0.0–0.1)
Basophils Relative: 0 %
Eosinophils Absolute: 0.2 10*3/uL (ref 0.0–0.7)
Eosinophils Relative: 1 %
HCT: 44.7 % (ref 36.0–46.0)
Hemoglobin: 14.7 g/dL (ref 12.0–15.0)
Lymphocytes Relative: 22 %
Lymphs Abs: 3.5 10*3/uL (ref 0.7–4.0)
MCH: 31.2 pg (ref 26.0–34.0)
MCHC: 32.9 g/dL (ref 30.0–36.0)
MCV: 94.9 fL (ref 78.0–100.0)
Monocytes Absolute: 1.3 10*3/uL — ABNORMAL HIGH (ref 0.1–1.0)
Monocytes Relative: 8 %
Neutro Abs: 10.8 10*3/uL — ABNORMAL HIGH (ref 1.7–7.7)
Neutrophils Relative %: 69 %
Platelets: 389 10*3/uL (ref 150–400)
RBC: 4.71 MIL/uL (ref 3.87–5.11)
RDW: 14.3 % (ref 11.5–15.5)
WBC: 15.7 10*3/uL — ABNORMAL HIGH (ref 4.0–10.5)

## 2016-06-18 LAB — URINALYSIS, ROUTINE W REFLEX MICROSCOPIC
Bilirubin Urine: NEGATIVE
Glucose, UA: NEGATIVE mg/dL
Ketones, ur: NEGATIVE mg/dL
Nitrite: NEGATIVE
Protein, ur: NEGATIVE mg/dL
Specific Gravity, Urine: 1.019 (ref 1.005–1.030)
pH: 5 (ref 5.0–8.0)

## 2016-06-18 LAB — COMPREHENSIVE METABOLIC PANEL
ALT: 14 U/L (ref 14–54)
AST: 17 U/L (ref 15–41)
Albumin: 4.5 g/dL (ref 3.5–5.0)
Alkaline Phosphatase: 110 U/L (ref 38–126)
Anion gap: 11 (ref 5–15)
BUN: 20 mg/dL (ref 6–20)
CO2: 27 mmol/L (ref 22–32)
Calcium: 9.5 mg/dL (ref 8.9–10.3)
Chloride: 103 mmol/L (ref 101–111)
Creatinine, Ser: 0.92 mg/dL (ref 0.44–1.00)
GFR calc Af Amer: >60 mL/min (ref >60)
GFR calc non Af Amer: >60 mL/min (ref >60)
Glucose, Bld: 118 mg/dL — ABNORMAL HIGH (ref 65–99)
Potassium: 3.3 mmol/L — ABNORMAL LOW (ref 3.5–5.1)
Sodium: 141 mmol/L (ref 135–145)
Total Bilirubin: 0.6 mg/dL (ref 0.3–1.2)
Total Protein: 7 g/dL (ref 6.5–8.1)

## 2016-06-18 LAB — I-STAT CG4 LACTIC ACID, ED: Lactic Acid, Venous: 1.8 mmol/L (ref 0.5–1.9)

## 2016-06-18 MED ORDER — DIAZEPAM 5 MG PO TABS
5.0000 mg | ORAL_TABLET | Freq: Once | ORAL | Status: AC
  Administered 2016-06-18: 5 mg via ORAL
  Filled 2016-06-18: qty 1

## 2016-06-18 MED ORDER — DIAZEPAM 5 MG PO TABS: 5.0000 mg | ORAL_TABLET | Freq: Three times a day (TID) | ORAL | 0 refills | Status: DC | PRN

## 2016-06-18 NOTE — ED Provider Notes (Signed)
[facility] DEPT Provider Note   CSN: [id number] Arrival date & time: 06/18/16  1906     History   Chief Complaint Chief Complaint  Patient presents with  . possible bacterial meningitis    HPI [name] is a 66 y.o. female.  The history is provided by the patient.  Muscle Pain  This is a new problem. The current episode started 6 to 12 hours ago. The problem occurs constantly. The problem has not changed since onset.Pertinent negatives include no chest pain, no abdominal pain, no headaches and no shortness of breath. The symptoms are aggravated by bending and twisting. Nothing relieves the symptoms. She has tried rest for the symptoms. The treatment provided no relief.    Past Medical History:  Diagnosis Date  . Arrhythmia   . Arthritis   . Fibromyalgia   . GERD (gastroesophageal reflux disease)   . H/O: substance abuse 2005   narcotic usage due to chronic back pain  . Heart murmur   . Hypertension   . PONV (postoperative nausea and vomiting)     Patient Active Problem List   Diagnosis Date Noted  . Acute encephalopathy   . Fever   . Left hemiparesis ([place])   . Generalized OA   . Fibromyalgia   . Tobacco abuse   . Substance abuse   . Benign essential HTN   . Tachycardia   . Chronic pain syndrome   . Acute blood loss anemia   . Leukocytosis   . Septic thrombophlebitis of sagittal sinus   . Acute respiratory failure ([place])   . Streptococcus pneumoniae meningitis   . Streptococcal bacteremia   . Meningitis   . Cerebral embolism with cerebral infarction 04/12/2016  . Stroke (cerebrum) ([place]) 04/12/2016  . Neck pain 04/04/2016  . Chronic abdominal pain 02/19/2016  . Essential hypertension 02/19/2016  . Bacterial conjunctivitis 02/19/2016  . Health care maintenance 02/19/2016  . ANXIETY 05/08/2006  . TOBACCO DEPENDENCE 05/08/2006  . ASTHMA, UNSPECIFIED 05/08/2006  . GASTROESOPHAGEAL REFLUX, NO ESOPHAGITIS 05/08/2006  . CONSTIPATION 05/08/2006  .  CONVULSIONS, SEIZURES, NOS 05/08/2006    Past Surgical History:  Procedure Laterality Date  . ABDOMINAL HYSTERECTOMY    . ABDOMINAL SURGERY    . APPENDECTOMY    . BREAST SURGERY    . CHOLECYSTECTOMY N/A 09/30/2012   Procedure: LAPAROSCOPIC CHOLECYSTECTOMY WITH INTRAOPERATIVE CHOLANGIOGRAM;  Surgeon: Imogene Burn. Georgette Dover, MD;  Location: [facility] ORS;  Service: General;  Laterality: N/A;  . FRACTURE SURGERY     right upper arm  . OVARIAN CYST SURGERY      OB History    No data available       Home Medications    Prior to Admission medications   Medication Sig Start Date End Date Taking? Authorizing Provider  carvedilol (COREG) 3.125 MG tablet Take by mouth. 05/28/16 05/28/17  Historical Provider, MD  diazepam (VALIUM) 5 MG tablet Take 1 tablet (5 mg total) by mouth every 8 (eight) hours as needed for muscle spasms. 06/18/16   Esaw Grandchild, MD  HYDROcodone-acetaminophen (NORCO/VICODIN) 5-325 MG tablet Take 1 tablet by mouth 3 (three) times daily as needed (pain).    Historical Provider, MD  levETIRAcetam (KEPPRA) 500 MG tablet Take 1 tablet (500 mg total) by mouth every 12 (twelve) hours. 04/22/16 05/22/16  [name], MD  methadone (DOLOPHINE) 10 MG/ML solution Take 90 mg by mouth daily.     Historical Provider, MD  pantoprazole (PROTONIX) 40 MG tablet Take 1 tablet (40 mg total) by mouth  at bedtime. 04/22/16 05/22/16  [name], MD  pantoprazole (PROTONIX) 40 MG tablet Take by mouth.    Historical Provider, MD  potassium chloride (K-DUR,KLOR-CON) 10 MEQ tablet Take by mouth. 05/28/16 05/28/17  Historical Provider, MD  pregabalin (LYRICA) 50 MG capsule Take by mouth. 06/06/16 06/06/17  Historical Provider, MD    Family History Family History  Problem Relation Age of Onset  . Alzheimer's disease Mother   . Cancer Father     prostate  . Heart disease Father     Social History Social History  Substance Use Topics  . Smoking status: Current Every Day Smoker    Packs/day: 1.00    Years:  30.00    Types: Cigarettes  . Smokeless tobacco: Never Used  . Alcohol use No     Allergies   Codeine and Nsaids   Review of Systems Review of Systems  Constitutional: Positive for chills. Negative for fever.  HENT: Negative for ear pain and sore throat.   Eyes: Negative for pain and visual disturbance.  Respiratory: Negative for cough and shortness of breath.   Cardiovascular: Negative for chest pain and palpitations.  Gastrointestinal: Negative for abdominal pain and vomiting.  Genitourinary: Negative for dysuria and hematuria.  Musculoskeletal: Positive for myalgias and neck pain. Negative for arthralgias, back pain and neck stiffness.  Skin: Negative for color change and rash.  Neurological: Negative for seizures, syncope and headaches.  All other systems reviewed and are negative.    Physical Exam Updated Vital Signs BP 131/70   Pulse (!) 102   Temp 99 F (37.2 C) (Oral)   Resp 18   Ht 5\' 4"  (1.626 m)   Wt 49 kg   SpO2 98%   BMI 18.54 kg/m   Physical Exam  Constitutional: She appears well-developed and well-nourished. No distress.  HENT:  Head: Normocephalic and atraumatic.  Eyes: Conjunctivae are normal.    Neck: Neck supple.  Cardiovascular: Normal rate and regular rhythm.   No murmur heard. Pulmonary/Chest: Effort normal and breath sounds normal. No respiratory distress.  Abdominal: Soft. There is no tenderness.  Musculoskeletal: She exhibits no edema.       Cervical back: She exhibits decreased range of motion, tenderness and spasm. She exhibits no bony tenderness, no swelling, no edema, no laceration and no pain.       Back:  Neurological: She is alert. She has normal strength. No cranial nerve deficit or sensory deficit. GCS eye subscore is 4. GCS verbal subscore is 5. GCS motor subscore is 6.  Skin: Skin is warm and dry. No rash noted.  Psychiatric: She has a normal mood and affect. Her speech is normal and behavior is normal.  Nursing note and  vitals reviewed.    ED Treatments / Results  Labs (all labs ordered are listed, but only abnormal results are displayed) Labs Reviewed  COMPREHENSIVE METABOLIC PANEL - Abnormal; Notable for the following:       Result Value   Potassium 3.3 (*)    Glucose, Bld 118 (*)    All other components within normal limits  CBC WITH DIFFERENTIAL/PLATELET - Abnormal; Notable for the following:    WBC 15.7 (*)    Neutro Abs 10.8 (*)    Monocytes Absolute 1.3 (*)    All other components within normal limits  URINALYSIS, ROUTINE W REFLEX MICROSCOPIC - Abnormal; Notable for the following:    APPearance HAZY (*)    Hgb urine dipstick SMALL (*)    Leukocytes, UA TRACE (*)  Bacteria, UA MANY (*)    Squamous Epithelial / LPF 6-30 (*)    All other components within normal limits  I-STAT CG4 LACTIC ACID, ED  I-STAT CG4 LACTIC ACID, ED    EKG  EKG Interpretation None       Radiology Dg Chest 2 View  Result Date: 06/18/2016 CLINICAL DATA:  Dyspnea x3 days EXAM: CHEST  2 VIEW COMPARISON:  CT neck from 04/12/2016 for lung apices, CXR 04/13/2016 FINDINGS: The heart size is normal. Aortic atherosclerosis at the arch is identified without aneurysm. Mild emphysematous hyperinflation of the lungs bilaterally. Apical pleuroparenchymal scarring is seen bilaterally similar to prior CT. No acute osseous appearing abnormality. IMPRESSION: Hyperinflated lungs without acute pneumonic consolidation, CHF, pneumothorax nor effusion. Electronically Signed   By: Ashley Royalty M.D.   On: 06/18/2016 21:31    Procedures Procedures (including critical care time)  Medications Ordered in ED Medications  diazepam (VALIUM) tablet 5 mg (5 mg Oral Given 06/18/16 2129)     Initial Impression / Assessment and Plan / ED Course  I have reviewed the triage vital signs and the nursing notes.  Pertinent labs & imaging results that were available during my care of the patient were reviewed by me and considered in my medical  decision making (see chart for details).     66 year old female with history of meningitis in the February 2018 who presents in the setting of neck pain. Patient reports since meningitis she has been progressively getting better at a slow rate. She reports continued mild pain at times and intermittent chills. She reports over the last day she started having stiffness and pain to the right side of her neck. She thinks she is having muscle spasming and went to PCP for further evaluation. As patient endorses chills and neck pain with history of meningitis PCP sent to emergency department to [facility] for possible meningitis.  On arrival patient was hemodynamically stable and afebrile. 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 Otherwise neurologically intact. No signs of nuchal rigidity. Patient does have obvious tensed muscle on right side of neck. Patient has no midline tenderness and no other signs of infection. Chest x-ray will reveal no significant abnormality laboratory analysis with slight elevation in white blood cell, no other significant abnormality. Specifically no signs of anemia as patient has had issues with this in the past. Patient was given dose of Valium with significant improvement in symptoms.  No recent trauma, falls, new numbness or tingling and do not believe imaging is indicated at this time.  I have extremely low suspicion for meningitis at this time his presentation is consistent with muscle spasm. We'll discharge home with short course of Valium, plan to follow up with primary care physician, plan to follow up with massage therapist. Patient was given strict return precautions and stable at discharge. Patient and family in agreement with plan.  Final Clinical Impressions(s) / ED Diagnoses   Final diagnoses:  Neck pain    New Prescriptions New Prescriptions   DIAZEPAM (VALIUM) 5 MG TABLET    Take 1 tablet (5 mg total) by  mouth every 8 (eight) hours as needed for muscle spasms.     Esaw Grandchild, MD 06/18/16 [place], MD 06/18/16 [phone]

## 2016-06-18 NOTE — ED Notes (Signed)
Pt's husband stated that the patient is getting "agitated" and is ready to go. Pt and husband informed that the attending EDP also needs to assess the patient with the resident.

## 2016-06-18 NOTE — Patient Instructions (Addendum)
With your history of meningitis, and increasing neck pain, stiffness as well as recent chills and now spasm versus mass on back of neck, you need to be evaluated through the emergency room tonight to determine if repeat lumbar puncture is needed. They will likely check other blood tests in the emergency room.  Additionally with your fatigue, and shortness of breath recently, you need a repeat blood test to make sure the anemia has not worsened  I did refer you to gastroenterology for follow-up of the gastrointestinal bleeding from [facility].   Please return to discuss her other medications and any necessary refills after evaluation through the emergency room tonight.   IF you received an x-ray today, you will receive an invoice from [facility]. Please contact [facility] at [phone] with questions or concerns regarding your invoice.   IF you received labwork today, you will receive an invoice from [facility]. Please contact [facility] at [phone] with questions or concerns regarding your invoice.   Our billing staff will not be able to assist you with questions regarding bills from these companies.  You will be contacted with the lab results as soon as they are available. The fastest way to get your results is to activate your My Chart account. Instructions are located on the last page of this paperwork. If you have not heard from [place] regarding the results in 2 weeks, please contact this office.

## 2016-06-18 NOTE — Discharge Instructions (Signed)
Please locate local massage therapist for assistance with neck muscle tightness

## 2016-06-18 NOTE — ED Triage Notes (Signed)
Pt presents with complaints of neck stiffness, with a mass on her neck and spasms, she was sent her by her primary doctor to rule out meningitis due to history of meningitis and also recheck her anemia since she has had on and off shortness of breath for a few weeks. Patient is alert and oriented.

## 2016-06-18 NOTE — Progress Notes (Signed)
Subjective:  By signing my name below, I, [name], attest that this documentation has been prepared under the direction and in the presence of Marissa Agreste, MD Electronically Signed: Ladene Cardenas, ED Scribe 06/18/2016 at 3:32 PM.   Patient ID: [name], female    DOB: 1950/08/26, 66 y.o.   MRN: [id number]  Chief Complaint  Patient presents with  . Establish Care    Needs PCP  . Hospitalization Follow-up    Spinal Meningitis  . Referral    Neurologist, [name]  . Medication Refill    Carvedilol   HPI [name] is a 66 y.o. female who presents to Primary Care at [facility] for hospital follow-up. New pt to me. H/o multiple medical problems per problem list including seizures, stroke, substance abuse, chronic pain syndrome. Appears she has been seen through [facility] recently with last visit 3/29 with Dr. Lysle Morales. At that time she was treated with Amoxicillin and Prednisone for wheezing, iron testing for anemia, prescribed Lyrica for nerve pain, CBC obtained for high platelets. Notes reviewed from [facility].   She was most recently admitted 3/6-3/15 at [facility] with multiple issues including acute blood loss anemia, (Hgb was 5.1 when admitted) on 3/6 with gastrointestinal hemorrhage and hypokalemia. She had been admitted to [facility] previously 2/2-2/12 for cerebral embolism, stroke, aute respiratory failure, left hemiparesis and bacterial meningitis; sent home with IV Rocephin by PICC line. Treated with Xarelto for sagittal sinus thrombosis found on a CT.   She was previously followed by [place]. Seen for a new pt visit 02/19/16. Plan at 02/19/16 visit with [place] was 1-2 month follow-up. She is requesting a referral to gastroenterologist, neurologist and a refill of her Carvedilol today, as well as to establish care.   Neurologist Pt denies h/o seizures; states that she has only had 1 seizure when she had meningitis. She reports  gradually worsening right-sided neck pain that she describes as spasms that radiates down her spine for the past 3 weeks. Neck pains have worsened, She states that she noticed a mass on her neck onset 1 week ago. Pt also reports associated chills within the past week, without measured fever. Also has photophobia that she states she has had "for a while". She denies fever, loss of appetite, nausea, vomiting, blood in stools, melena, rash. She requests a referral to neurology at this visit.   GI Was unable to have the outpatient colonoscopy due to severe fatigue after  Hospitalization for GI bleeding.  She would like to be referred to GI to reschedule her colonoscopy and figure out why she had a GI bleed. Pt's hgb was 10.9 on 3/20.  Fatigue  Pt reports increased fatigue over the past week, worsened yesterday. She has noticed associated an associated symptom of sob for the past week that is exacerbated on exertion. She denies chest pain. Pt does not have a cardiologist but states that she was prescribed Carvedilol by her previous PCP for tachycardia.   Patient Active Problem List   Diagnosis Date Noted  . Acute encephalopathy   . Fever   . Left hemiparesis ([place])   . Generalized OA   . Fibromyalgia   . Tobacco abuse   . Substance abuse   . Benign essential HTN   . Tachycardia   . Chronic pain syndrome   . Acute blood loss anemia   . Leukocytosis   . Septic thrombophlebitis of sagittal sinus   . Acute respiratory  failure ([place])   . Streptococcus pneumoniae meningitis   . Streptococcal bacteremia   . Meningitis   . Cerebral embolism with cerebral infarction 04/12/2016  . Stroke (cerebrum) ([place]) 04/12/2016  . Neck pain 04/04/2016  . Chronic abdominal pain 02/19/2016  . Essential hypertension 02/19/2016  . Bacterial conjunctivitis 02/19/2016  . Health care maintenance 02/19/2016  . ANXIETY 05/08/2006  . TOBACCO DEPENDENCE 05/08/2006  . ASTHMA, UNSPECIFIED 05/08/2006  . GASTROESOPHAGEAL  REFLUX, NO ESOPHAGITIS 05/08/2006  . CONSTIPATION 05/08/2006  . CONVULSIONS, SEIZURES, NOS 05/08/2006   Past Medical History:  Diagnosis Date  . Arrhythmia   . Arthritis   . Fibromyalgia   . GERD (gastroesophageal reflux disease)   . H/O: substance abuse 2005   narcotic usage due to chronic back pain  . Heart murmur   . Hypertension   . PONV (postoperative nausea and vomiting)    Past Surgical History:  Procedure Laterality Date  . ABDOMINAL HYSTERECTOMY    . ABDOMINAL SURGERY    . APPENDECTOMY    . BREAST SURGERY    . CHOLECYSTECTOMY N/A 09/30/2012   Procedure: LAPAROSCOPIC CHOLECYSTECTOMY WITH INTRAOPERATIVE CHOLANGIOGRAM;  Surgeon: Imogene Burn. Georgette Dover, MD;  Location: [facility] ORS;  Service: General;  Laterality: N/A;  . FRACTURE SURGERY     right upper arm  . OVARIAN CYST SURGERY     Allergies  Allergen Reactions  . Codeine Nausea And Vomiting  . Nsaids Other (See Comments)    Stomach ulcers   Prior to Admission medications   Medication Sig Start Date End Date Taking? Authorizing Provider  carvedilol (COREG) 3.125 MG tablet Take by mouth. 05/28/16 05/28/17 Yes Historical Provider, MD  pantoprazole (PROTONIX) 40 MG tablet Take by mouth.   Yes Historical Provider, MD  potassium chloride (K-DUR,KLOR-CON) 10 MEQ tablet Take by mouth. 05/28/16 05/28/17 Yes Historical Provider, MD  pregabalin (LYRICA) 50 MG capsule Take by mouth. 06/06/16 06/06/17 Yes Historical Provider, MD  HYDROcodone-acetaminophen (NORCO/VICODIN) 5-325 MG tablet Take 1 tablet by mouth 3 (three) times daily as needed (pain).    Historical Provider, MD  levETIRAcetam (KEPPRA) 500 MG tablet Take 1 tablet (500 mg total) by mouth every 12 (twelve) hours. 04/22/16 05/22/16  [name], MD  methadone (DOLOPHINE) 10 MG/ML solution Take 90 mg by mouth daily.     Historical Provider, MD  pantoprazole (PROTONIX) 40 MG tablet Take 1 tablet (40 mg total) by mouth at bedtime. 04/22/16 05/22/16  [name], MD   Social History    Social History  . Marital status: Married    Spouse name: N/A  . Number of children: N/A  . Years of education: N/A   Occupational History  . Not on file.   Social History Main Topics  . Smoking status: Current Every Day Smoker    Packs/day: 1.00    Years: 30.00    Types: Cigarettes  . Smokeless tobacco: Never Used  . Alcohol use No  . Drug use: No     Comment: Methadone for narcotic use  . Sexual activity: Not on file   Other Topics Concern  . Not on file   Social History Narrative  . No narrative on file   Review of Systems  Constitutional: Positive for chills and fatigue. Negative for appetite change and fever.  Eyes: Positive for photophobia.  Respiratory: Positive for shortness of breath.   Cardiovascular: Negative for chest pain.  Gastrointestinal: Negative for blood in stool, nausea and vomiting.  Musculoskeletal: Positive for neck pain and neck stiffness.  Skin:  Negative for rash.      Objective:   Physical Exam  Constitutional: She is oriented to person, place, and time. She appears well-developed and well-nourished.  HENT:  Head: Normocephalic and atraumatic.  Eyes: Conjunctivae and EOM are normal. Pupils are equal, round, and reactive to light.  Neck: Carotid bruit is not present.  Neck held in flexion. Limited extension when she does extend her neck. Mass/soft tissue prominence on R lateral neck. Limited about 20-30 degrees rotation bilaterally due to pain.  Cardiovascular: Normal rate, regular rhythm, normal heart sounds and intact distal pulses.   Pulmonary/Chest: Effort normal and breath sounds normal.  Abdominal: Soft. She exhibits no pulsatile midline mass. There is no tenderness.  Neurological: She is alert and oriented to person, place, and time.  Skin: Skin is warm and dry.  Psychiatric: She has a normal mood and affect. Her behavior is normal.  Vitals reviewed.  Vitals:   06/18/16 1435  BP: 137/86  Pulse: (!) 106  Resp: 18  Temp: 98.3  F (36.8 C)  TempSrc: Oral  SpO2: 100%  Weight: 108 lb (49 kg)  Height: 5\' 4"  (1.626 m)   3:52 PM-Discussed sob and h/o fatigue with prior anemia. Offered CBC here to make sure she was not acutely anemic that would require EMS transport. This was declined and would go by private vehicle. BP was maintained in office. Reported chronic tachycardia.  Over 40 minutes for office visit including record review and coordination of care.     Assessment & Plan:   [name] is a 66 y.o. female Gastrointestinal hemorrhage, unspecified gastrointestinal hemorrhage type - Plan: Ambulatory referral to Gastroenterology  - hx of GI bleed. Plan for GI eval and to decide on colonoscopy.   History of bacterial meningitis Neck pain Chills  - hx of bacterial meningitis, now with increasing neck pain, radiating neck pain, and recent chills. Spasm vs soft tissue mass R neck.   - eval in ER as may need imaging of neck and possible repeat LP given prior meningitis.   Shortness of breath Other fatigue Tachycardia  - hx of anemia, GI bleed prior.  Denies stool changes. Most recent HGB ok, but will need repeat testing to make sure has not had decline in HGB or repeat GI bleed.    Charge nurse advised at ER.   Can return after ER visit to discuss med refills and to establish care.   Meds ordered this encounter  Medications  . carvedilol (COREG) 3.125 MG tablet    Sig: Take by mouth.  . pregabalin (LYRICA) 50 MG capsule    Sig: Take by mouth.  . potassium chloride (K-DUR,KLOR-CON) 10 MEQ tablet    Sig: Take by mouth.  . pantoprazole (PROTONIX) 40 MG tablet    Sig: Take by mouth.   Patient Instructions   With your history of meningitis, and increasing neck pain, stiffness as well as recent chills and now spasm versus mass on back of neck, you need to be evaluated through the emergency room tonight to determine if repeat lumbar puncture is needed. They will likely check other blood tests in the emergency  room.  Additionally with your fatigue, and shortness of breath recently, you need a repeat blood test to make sure the anemia has not worsened  I did refer you to gastroenterology for follow-up of the gastrointestinal bleeding from [facility].   Please return to discuss her other medications and any necessary refills after evaluation through the emergency room tonight.  IF you received an x-ray today, you will receive an invoice from [facility]. Please contact [facility] at [phone] with questions or concerns regarding your invoice.   IF you received labwork today, you will receive an invoice from [facility]. Please contact [facility] at [phone] with questions or concerns regarding your invoice.   Our billing staff will not be able to assist you with questions regarding bills from these companies.  You will be contacted with the lab results as soon as they are available. The fastest way to get your results is to activate your My Chart account. Instructions are located on the last page of this paperwork. If you have not heard from [place] regarding the results in 2 weeks, please contact this office.      I personally performed the services described in this documentation, which was scribed in my presence. The recorded information has been reviewed and considered for accuracy and completeness, addended by me as needed, and agree with information above.  Signed,   [name], MD Primary Care at [facility].  06/18/16 11:53 PM

## 2016-06-21 ENCOUNTER — Ambulatory Visit (HOSPITAL_COMMUNITY)
Admission: EM | Admit: 2016-06-21 | Discharge: 2016-06-21 | Disposition: A | Discharge status: Home / Self Care | Payer: Medicare Other | Attending: Family Medicine | Admitting: Family Medicine

## 2016-06-21 ENCOUNTER — Encounter (HOSPITAL_COMMUNITY): Payer: Self-pay | Admitting: Emergency Medicine

## 2016-06-21 DIAGNOSIS — M62838 Other muscle spasm: Secondary | ICD-10-CM | POA: Diagnosis not present

## 2016-06-21 DIAGNOSIS — M436 Torticollis: Secondary | ICD-10-CM

## 2016-06-21 MED ORDER — CARISOPRODOL 350 MG PO TABS: 350.0000 mg | ORAL_TABLET | Freq: Three times a day (TID) | ORAL | 0 refills | Status: DC

## 2016-06-21 NOTE — ED Provider Notes (Signed)
CSN: [id number]     Arrival date & time 06/21/16  1248 History   First MD Initiated Contact with Patient 06/21/16 1320     Chief Complaint  Patient presents with  . Back Pain  . Spasms   (Consider location/radiation/quality/duration/timing/severity/associated sxs/prior Treatment) Patient c/o neck pain and muscle spasms for 2 weeks.  She is having difficulty raising her head up.  She states that she had episode of viral meningitis and since she has had the neck pain.  She states she went to ED and they gave her some Valium but it didn't help. She does not tolerated NSAIDS.  She states she had a steroid taper rx and this didn't help. She states she took some flexeril and this did not help.     The history is provided by the patient.  Back Pain  Location:  Generalized Pain severity:  Severe Pain is:  Worse during the day Onset quality:  Sudden   Past Medical History:  Diagnosis Date  . Arrhythmia   . Arthritis   . Fibromyalgia   . GERD (gastroesophageal reflux disease)   . H/O: substance abuse 2005   narcotic usage due to chronic back pain  . Heart murmur   . Hypertension   . PONV (postoperative nausea and vomiting)    Past Surgical History:  Procedure Laterality Date  . ABDOMINAL HYSTERECTOMY    . ABDOMINAL SURGERY    . APPENDECTOMY    . BREAST SURGERY    . CHOLECYSTECTOMY N/A 09/30/2012   Procedure: LAPAROSCOPIC CHOLECYSTECTOMY WITH INTRAOPERATIVE CHOLANGIOGRAM;  Surgeon: Imogene Burn. Georgette Dover, MD;  Location: [facility];  Service: General;  Laterality: N/A;  . FRACTURE SURGERY     right upper arm  . OVARIAN CYST SURGERY     Family History  Problem Relation Age of Onset  . Alzheimer's disease Mother   . Cancer Father     prostate  . Heart disease Father    Social History  Substance Use Topics  . Smoking status: Current Every Day Smoker    Packs/day: 1.00    Years: 30.00    Types: Cigarettes  . Smokeless tobacco: Never Used  . Alcohol use No   OB History    No data  available     Review of Systems  Constitutional: Negative.   Eyes: Negative.   Respiratory: Negative.   Cardiovascular: Negative.   Endocrine: Negative.   Genitourinary: Negative.   Musculoskeletal: Positive for back pain.  Allergic/Immunologic: Negative.   Psychiatric/Behavioral: Negative.     Allergies  Codeine and Nsaids  Home Medications   Prior to Admission medications   Medication Sig Start Date End Date Taking? Authorizing Provider  carvedilol (COREG) 3.125 MG tablet Take by mouth. 05/28/16 05/28/17 Yes Historical Provider, MD  diazepam (VALIUM) 5 MG tablet Take 1 tablet (5 mg total) by mouth every 8 (eight) hours as needed for muscle spasms. 06/18/16  Yes Esaw Grandchild, MD  pantoprazole (PROTONIX) 40 MG tablet Take by mouth.   Yes Historical Provider, MD  potassium chloride (K-DUR,KLOR-CON) 10 MEQ tablet Take by mouth. 05/28/16 05/28/17 Yes Historical Provider, MD  carisoprodol (SOMA) 350 MG tablet Take 1 tablet (350 mg total) by mouth 3 (three) times daily. 06/21/16   Lysbeth Penner, FNP  HYDROcodone-acetaminophen (NORCO/VICODIN) 5-325 MG tablet Take 1 tablet by mouth 3 (three) times daily as needed (pain).    Historical Provider, MD  levETIRAcetam (KEPPRA) 500 MG tablet Take 1 tablet (500 mg total) by mouth every 12 (twelve) hours.  04/22/16 05/22/16  [name], MD  methadone (DOLOPHINE) 10 MG/ML solution Take 90 mg by mouth daily.     Historical Provider, MD  pantoprazole (PROTONIX) 40 MG tablet Take 1 tablet (40 mg total) by mouth at bedtime. 04/22/16 05/22/16  [name], MD  pregabalin (LYRICA) 50 MG capsule Take by mouth. 06/06/16 06/06/17  Historical Provider, MD   Meds Ordered and Administered this Visit  Medications - No data to display  BP 132/74 (BP Location: Left Arm)   Pulse 88   Temp 98.4 F (36.9 C) (Oral)   Resp 18   SpO2 100%  No data found.   Physical Exam  Constitutional: She is oriented to person, place, and time. She appears well-developed and  well-nourished.  HENT:  Head: Normocephalic and atraumatic.  Eyes: EOM are normal. Pupils are equal, round, and reactive to light.  Cardiovascular: Normal rate, regular rhythm and normal heart sounds.   Pulmonary/Chest: Effort normal and breath sounds normal.  Musculoskeletal: She exhibits tenderness.  Cervical Spine with tenderness and swelling along paraspinous muscles.  Patient with Difficulty ROM cervical spine.    Neurological: She is alert and oriented to person, place, and time.  Nursing note and vitals reviewed.   Urgent Care Course     Procedures (including critical care time)  Labs Review Labs Reviewed - No data to display  Imaging Review No results found.   Visual Acuity Review  Right Eye Distance:   Left Eye Distance:   Bilateral Distance:    Right Eye Near:   Left Eye Near:    Bilateral Near:         MDM   1. Cervical paraspinal muscle spasm   2. Torticollis, acute    Soma one po q 8 hours prn #15  Referral to Sports Medicine Orthopedic Dr. Maryla Morrow Call office and schedule appointment in 3 days or later.      Lysbeth Penner, FNP 06/21/16 1416

## 2016-06-21 NOTE — ED Triage Notes (Signed)
Pt is here for persistent back pain and muscle spasms and neck stiffness onset 2 weeks  Reports she was seen at [place] for similar sx  Husband at bed side  A&O x4... NAD

## 2016-06-27 ENCOUNTER — Inpatient Hospital Stay: Payer: Medicare Other | Admitting: Obstetrics and Gynecology

## 2016-06-28 ENCOUNTER — Inpatient Hospital Stay: Payer: Medicare Other | Admitting: Family Medicine

## 2016-07-12 ENCOUNTER — Encounter: Payer: Self-pay | Admitting: Family Medicine

## 2016-07-12 ENCOUNTER — Ambulatory Visit (INDEPENDENT_AMBULATORY_CARE_PROVIDER_SITE_OTHER): Payer: Medicare Other | Admitting: Family Medicine

## 2016-07-12 VITALS — BP 142/92 | HR 102 | Resp 17 | Ht 64.0 in | Wt 114.0 lb

## 2016-07-12 DIAGNOSIS — I639 Cerebral infarction, unspecified: Secondary | ICD-10-CM

## 2016-07-12 DIAGNOSIS — R109 Unspecified abdominal pain: Secondary | ICD-10-CM | POA: Diagnosis not present

## 2016-07-12 DIAGNOSIS — R6883 Chills (without fever): Secondary | ICD-10-CM

## 2016-07-12 DIAGNOSIS — R002 Palpitations: Secondary | ICD-10-CM | POA: Diagnosis not present

## 2016-07-12 DIAGNOSIS — R110 Nausea: Secondary | ICD-10-CM | POA: Diagnosis not present

## 2016-07-12 DIAGNOSIS — D72829 Elevated white blood cell count, unspecified: Secondary | ICD-10-CM | POA: Diagnosis not present

## 2016-07-12 DIAGNOSIS — M549 Dorsalgia, unspecified: Secondary | ICD-10-CM

## 2016-07-12 DIAGNOSIS — M542 Cervicalgia: Secondary | ICD-10-CM | POA: Diagnosis not present

## 2016-07-12 DIAGNOSIS — Z8719 Personal history of other diseases of the digestive system: Secondary | ICD-10-CM

## 2016-07-12 DIAGNOSIS — M436 Torticollis: Secondary | ICD-10-CM

## 2016-07-12 DIAGNOSIS — M62838 Other muscle spasm: Secondary | ICD-10-CM | POA: Diagnosis not present

## 2016-07-12 DIAGNOSIS — R197 Diarrhea, unspecified: Secondary | ICD-10-CM | POA: Diagnosis not present

## 2016-07-12 LAB — POCT CBC
Granulocyte percent: 54.3 % (ref 37–80)
HCT, POC: 38.1 % (ref 37.7–47.9)
Hemoglobin: 12.9 g/dL (ref 12.2–16.2)
Lymph, poc: 4.4 — AB (ref 0.6–3.4)
MCH, POC: 31.9 pg — AB (ref 27–31.2)
MCHC: 33.9 g/dL (ref 31.8–35.4)
MCV: 94.1 fL (ref 80–97)
MID (cbc): 0.8 (ref 0–0.9)
MPV: 7.1 fL (ref 0–99.8)
POC Granulocyte: 6.1 (ref 2–6.9)
POC LYMPH PERCENT: 39 % (ref 10–50)
POC MID %: 6.7 % (ref 0–12)
Platelet Count, POC: 508 K/uL — AB (ref 142–424)
RBC: 4.05 M/uL (ref 4.04–5.48)
RDW, POC: 15.9 %
WBC: 11.2 K/uL — AB (ref 4.6–10.2)

## 2016-07-12 MED ORDER — CARISOPRODOL 350 MG PO TABS
350.0000 mg | ORAL_TABLET | Freq: Three times a day (TID) | ORAL | 0 refills | Status: DC | PRN
Start: 2016-07-12 — End: 2016-07-19

## 2016-07-12 NOTE — Patient Instructions (Addendum)
For muscle spasm and neck/back pains I did refer you to orthopaedist as you may need further imaging or possible physical therapy. I did provide Soma for now up to 3 times per day, but would not want to use that medication long term.   I referred you to cardiology for your palpitations, but if any worsening of those symptoms, chest pain or shortness of breath prior to that visit - be seen here, emergency room or call 911 if needed.   For your abdominal pain and diarrhea, I will order a CT scan of your abdomen as infection fighting cells are still slightly elevated.  I will check other testing including test for C. Diff bacteria. I also referred you to gastroenterology for further evaluation of abdominal pain and workup of your prior gastrointestinal bleeding.   Return to the clinic or go to the nearest emergency room if any of your symptoms worsen or new symptoms occur.  Recheck with me next week, unless I let you know other plan based on testing above.     Muscle Cramps and Spasms Muscle cramps and spasms occur when a muscle or muscles tighten and you have no control over this tightening (involuntary muscle contraction). They are a common problem and can develop in any muscle. The most common place is in the calf muscles of the leg. Muscle cramps and muscle spasms are both involuntary muscle contractions, but there are some differences between the two:  Muscle cramps are painful. They come and go and may last a few seconds to 15 minutes. Muscle cramps are often more forceful and last longer than muscle spasms.  Muscle spasms may or may not be painful. They may also last just a few seconds or much longer. Certain medical conditions, such as diabetes or Parkinson disease, can make it more likely to develop cramps or spasms. However, cramps or spasms are usually not caused by a serious underlying problem. Common causes include:  Overexertion.  Overuse from repetitive motions, or doing the same  thing over and over.  Remaining in a certain position for a long period of time.  Improper preparation, form, or technique while playing a sport or doing an activity.  Dehydration.  Injury.  Side effects of some medicines.  Abnormally low levels of the salts and ions in your blood (electrolytes), especially potassium and calcium. This could happen if you are taking water pills (diuretics) or if you are pregnant. In many cases, the cause of muscle cramps or spasms is unknown. Follow these instructions at home:  Stay well hydrated. Drink enough fluid to keep your urine clear or pale yellow.  Try massaging, stretching, and relaxing the affected muscle.  If directed, apply heat to tight or tense muscles as often as told by your health care provider. Use the heat source that your health care provider recommends, such as a moist heat pack or a heating pad.  Place a towel between your skin and the heat source.  Leave the heat on for 20-30 minutes.  Remove the heat if your skin turns bright red. This is especially important if you are unable to feel pain, heat, or cold. You may have a greater risk of getting burned.  If directed, put ice on the affected area. This may help if you are sore or have pain after a cramp or spasm.  Put ice in a plastic bag.  Place a towel between your skin and the bag.  Leavethe ice on for 20 minutes, 2-3 times  a day.  Take over-the-counter and prescription medicines only as told by your health care provider.  Pay attention to any changes in your symptoms. Contact a health care provider if:  Your cramps or spasms get more severe or happen more often.  Your cramps or spasms do not improve over time. This information is not intended to replace advice given to you by your health care provider. Make sure you discuss any questions you have with your health care provider. Document Released: 08/17/2001 Document Revised: 03/29/2015 Document Reviewed:  11/29/2014 Elsevier Interactive Patient Education  2017 [employer]   Abdominal Pain, Adult Abdominal pain can be caused by many things. Often, abdominal pain is not serious and it gets better with no treatment or by being treated at home. However, sometimes abdominal pain is serious. Your health care provider will do a medical history and a physical exam to try to determine the cause of your abdominal pain. Follow these instructions at home:  Take over-the-counter and prescription medicines only as told by your health care provider. Do not take a laxative unless told by your health care provider.  Drink enough fluid to keep your urine clear or pale yellow.  Watch your condition for any changes.  Keep all follow-up visits as told by your health care provider. This is important. Contact a health care provider if:  Your abdominal pain changes or gets worse.  You are not hungry or you lose weight without trying.  You are constipated or have diarrhea for more than 2-3 days.  You have pain when you urinate or have a bowel movement.  Your abdominal pain wakes you up at night.  Your pain gets worse with meals, after eating, or with certain foods.  You are throwing up and cannot keep anything down.  You have a fever. Get help right away if:  Your pain does not go away as soon as your health care provider told you to expect.  You cannot stop throwing up.  Your pain is only in areas of the abdomen, such as the right side or the left lower portion of the abdomen.  You have bloody or black stools, or stools that look like tar.  You have severe pain, cramping, or bloating in your abdomen.  You have signs of dehydration, such as:  Dark urine, very little urine, or no urine.  Cracked lips.  Dry mouth.  Sunken eyes.  Sleepiness.  Weakness. This information is not intended to replace advice given to you by your health care provider. Make sure you discuss any questions you  have with your health care provider. Document Released: 12/05/2004 Document Revised: 09/15/2015 Document Reviewed: 08/09/2015 Elsevier Interactive Patient Education  2017 [employer].    Palpitations A palpitation is the feeling that your heartbeat is irregular or is faster than normal. It may feel like your heart is fluttering or skipping a beat. Palpitations are usually not a serious problem. They may be caused by many things, including smoking, caffeine, alcohol, stress, and certain medicines. Although most causes of palpitations are not serious, palpitations can be a sign of a serious medical problem. In some cases, you may need further medical evaluation. Follow these instructions at home: Pay attention to any changes in your symptoms. Take these actions to help with your condition:  Avoid the following:  Caffeinated coffee, tea, soft drinks, diet pills, and energy drinks.  Chocolate.  Alcohol.  Do not use any tobacco products, such as cigarettes, chewing tobacco, and e-cigarettes. If  you need help quitting, ask your health care provider.  Try to reduce your stress and anxiety. Things that can help you relax include:  Yoga.  Meditation.  Physical activity, such as swimming, jogging, or walking.  Biofeedback. This is a method that helps you learn to use your mind to control things in your body, such as your heartbeats.  Get plenty of rest and sleep.  Take over-the-counter and prescription medicines only as told by your health care provider.  Keep all follow-up visits as told by your health care provider. This is important. Contact a health care provider if:  You continue to have a fast or irregular heartbeat after 24 hours.  Your palpitations occur more often. Get help right away if:  You have chest pain or shortness of breath.  You have a severe headache.  You feel dizzy or you faint. This information is not intended to replace advice given to you by your health  care provider. Make sure you discuss any questions you have with your health care provider. Document Released: 02/23/2000 Document Revised: 07/31/2015 Document Reviewed: 11/10/2014 Elsevier Interactive Patient Education  2017 [employer].  IF you received an x-ray today, you will receive an invoice from [facility]. Please contact [facility] at [phone] with questions or concerns regarding your invoice.   IF you received labwork today, you will receive an invoice from [place]. Please contact [facility] at [phone] with questions or concerns regarding your invoice.   Our billing staff will not be able to assist you with questions regarding bills from these companies.  You will be contacted with the lab results as soon as they are available. The fastest way to get your results is to activate your My Chart account. Instructions are located on the last page of this paperwork. If you have not heard from [place] regarding the results in 2 weeks, please contact this office.

## 2016-07-12 NOTE — Progress Notes (Signed)
Subjective:  By signing my name below, I, [name], attest that this documentation has been prepared under the direction and in the presence of Marissa Agreste, MD Electronically Signed: [name], ED Scribe 07/12/2016 at 6:19 PM.   Patient ID: [name], female    DOB: 10-29-1950, 66 y.o.   MRN: [id number]  Chief Complaint  Patient presents with  . Neck Pain    since minengitis  . Back Pain    since minengitis  . Shortness of Breath    onset 1 month  . Abdominal Pain    onset 4 months with chronic diarrhea   HPI [name] is a 66 y.o. female who presents to Primary Care at [facility] for multiple complaints. Last seen by me 4/10 for hospital follow-up.   GI hemorrhoage and Hypokalemia Pt was admitted 3/6-3/15 at [facility] for acute blood loss anemia with gastrointestinal hemorrhage and hypokalemia. Hgb was 5.1 when admitted and 10.9 on 3/20. Plan for outpatient colonoscopy but had not had performed at last visit due to fatigue after hospitalization. When seen on 4/10 she was having increased sob for previous week. Had been treated for tachycardia by previous PCP with Coreg. At last visit she was sent to ER without blood work in office due to concern of recurrent meningitis. Hgb was 14.7 on 4/10 ER visit.   Neck Pain and Back Pain Admitted to [facility] 2/2-12 for cerebral embolism/stroke, left hemiparesis and bacterial meningitis. Treated with Xarlelto for sagittal sinus thrombosis found on CT. Denies seizures as of last visit but was having spasms radiating down her spine for 3 weeks, worsening neck pains, subjective chills for 1 week prior to last visit and persistent photophobia. Pt also complained of increased fatigue for prior week. Did note some possible spasm of paraspinal muscles last visit but neck held in flexion with minimal movement due to pain. With increasing neck pain and subjective chills, she was sent to ER as prior meningitis. In the ER, CBC obtained with WBC  of 15.7,  K slightly low at 3.3. She had a CXR without concerns. There was an extremely low suspicion for meningitis based on exam. Neck pain thought to be due to spasms. Prescribed Valium 5 mg q.8.h. Seen in urgent care center on 4/13. Prescribed Soma q.8.h #15 and referred to orthopedics. She was seen at [facility] ER on 4/14 after EMS transport for neck pain. Per note, pt was unresponsive at home than alert and oriented PTA to ED. She had a CXR that showed no acute process, CT of her neck without focal source of infection. There was reversal of lordosis and multilevel spondylosis, stable, reactive adenopathy. She had an EKG with incomplete RBBB, prolonged QT. She was discharged home as workup was negative for acute findings and she was prescribed percocet #13 q.4.h PRN.   Pt states that she followed up with [facility] but was told that they do not have a neck specialist and she would need a referral to another orthopedist. She states that neck pain has worsened, radiates down her back and makes it difficult for her to straighten her back. She reports chills since meningitis but she denies fever. Pt states that [name] has improved pain some and allowed her to lift her head. She states that Flexeril did not provide any relief. Pt reports an addiction to Demerol in her 60's and started Methadone afterwards. Pt denies addiction to muscle relaxants. Pt denies IV drug use or h/o alcohol use.  Abdominal Pain Pt reports intermittent right abdominal pain that she describes as severe and diarrhea since GI bleed approximately 2 months ago. She reports at least 4-5 episodes of diarrhea daily. Pt reports associated symptoms of nausea, fatigue and heart racing over the past 2 weeks. She is still taking Protonix qd. Pt denies melena, vomiting, urinary frequency, hematuria, difficulty urinating, chest pain. She states that she had an endoscopy done during her hospitalization and planned for an outpatient colonoscopy but this was  not done. Pt states that she has not been able to follow-up with a gastroenterologist since she did not have a local referral. She reports a h/o colitis several years ago. Pt reports that her sister was recently admitted for a heart blockage, however, pt has never been evaluated by a cardiologist.   Patient Active Problem List   Diagnosis Date Noted  . Acute encephalopathy   . Fever   . Left hemiparesis ([place])   . Generalized OA   . Fibromyalgia   . Tobacco abuse   . Substance abuse   . Benign essential HTN   . Tachycardia   . Chronic pain syndrome   . Acute blood loss anemia   . Leukocytosis   . Septic thrombophlebitis of sagittal sinus   . Acute respiratory failure ([place])   . Streptococcus pneumoniae meningitis   . Streptococcal bacteremia   . Meningitis   . Cerebral embolism with cerebral infarction 04/12/2016  . Stroke (cerebrum) ([place]) 04/12/2016  . Neck pain 04/04/2016  . Chronic abdominal pain 02/19/2016  . Essential hypertension 02/19/2016  . Bacterial conjunctivitis 02/19/2016  . Health care maintenance 02/19/2016  . ANXIETY 05/08/2006  . TOBACCO DEPENDENCE 05/08/2006  . ASTHMA, UNSPECIFIED 05/08/2006  . GASTROESOPHAGEAL REFLUX, NO ESOPHAGITIS 05/08/2006  . CONSTIPATION 05/08/2006  . CONVULSIONS, SEIZURES, NOS 05/08/2006   Past Medical History:  Diagnosis Date  . Arrhythmia   . Arthritis   . Fibromyalgia   . GERD (gastroesophageal reflux disease)   . H/O: substance abuse 2005   narcotic usage due to chronic back pain  . Heart murmur   . Hypertension   . PONV (postoperative nausea and vomiting)    Past Surgical History:  Procedure Laterality Date  . ABDOMINAL HYSTERECTOMY    . ABDOMINAL SURGERY    . APPENDECTOMY    . BREAST SURGERY    . CHOLECYSTECTOMY N/A 09/30/2012   Procedure: LAPAROSCOPIC CHOLECYSTECTOMY WITH INTRAOPERATIVE CHOLANGIOGRAM;  Surgeon: Imogene Burn. Georgette Dover, MD;  Location: [facility] ORS;  Service: General;  Laterality: N/A;  . FRACTURE SURGERY      right upper arm  . OVARIAN CYST SURGERY     Allergies  Allergen Reactions  . Codeine Nausea And Vomiting  . Nsaids Other (See Comments)    Stomach ulcers   Prior to Admission medications   Medication Sig Start Date End Date Taking? Authorizing Provider  carisoprodol (SOMA) 350 MG tablet Take 1 tablet (350 mg total) by mouth 3 (three) times daily. 06/21/16   Lysbeth Penner, FNP  carvedilol (COREG) 3.125 MG tablet Take by mouth. 05/28/16 05/28/17  Historical Provider, MD  diazepam (VALIUM) 5 MG tablet Take 1 tablet (5 mg total) by mouth every 8 (eight) hours as needed for muscle spasms. 06/18/16   Esaw Grandchild, MD  HYDROcodone-acetaminophen (NORCO/VICODIN) 5-325 MG tablet Take 1 tablet by mouth 3 (three) times daily as needed (pain).    Historical Provider, MD  levETIRAcetam (KEPPRA) 500 MG tablet Take 1 tablet (500 mg total) by mouth every 12 (twelve) hours.  04/22/16 05/22/16  [name], MD  methadone (DOLOPHINE) 10 MG/ML solution Take 90 mg by mouth daily.     Historical Provider, MD  pantoprazole (PROTONIX) 40 MG tablet Take 1 tablet (40 mg total) by mouth at bedtime. 04/22/16 05/22/16  [name], MD  pantoprazole (PROTONIX) 40 MG tablet Take by mouth.    Historical Provider, MD  potassium chloride (K-DUR,KLOR-CON) 10 MEQ tablet Take by mouth. 05/28/16 05/28/17  Historical Provider, MD  pregabalin (LYRICA) 50 MG capsule Take by mouth. 06/06/16 06/06/17  Historical Provider, MD   Social History   Social History  . Marital status: Married    Spouse name: N/A  . Number of children: N/A  . Years of education: N/A   Occupational History  . Not on file.   Social History Main Topics  . Smoking status: Current Every Day Smoker    Packs/day: 1.00    Years: 30.00    Types: Cigarettes  . Smokeless tobacco: Never Used  . Alcohol use No  . Drug use: No     Comment: Methadone for narcotic use  . Sexual activity: Not on file   Other Topics Concern  . Not on file   Social History  Narrative  . No narrative on file   Review of Systems  Constitutional: Positive for chills and fatigue. Negative for fever.  Respiratory: Positive for shortness of breath.   Cardiovascular: Positive for palpitations. Negative for chest pain.  Gastrointestinal: Positive for abdominal pain, diarrhea and nausea. Negative for vomiting.  Genitourinary: Negative for difficulty urinating, frequency and hematuria.  Musculoskeletal: Positive for back pain and neck pain.   Objective:   Physical Exam  Constitutional: She is oriented to person, place, and time. She appears well-developed and well-nourished. No distress.  HENT:  Head: Normocephalic and atraumatic.  Eyes: Conjunctivae and EOM are normal.  Neck: Neck supple. No tracheal deviation present.  Spasm of paraspinal muscles bilaterally.  Cardiovascular: Normal rate and regular rhythm.   Pulmonary/Chest: Effort normal and breath sounds normal. No respiratory distress.  Abdominal: There is tenderness in the right upper quadrant and right lower quadrant.  Musculoskeletal: Normal range of motion.  Spasms of lower trapezius and paraspinal muscles along spine. Skin intact. Pt complains of pain diffusely of upper to lower spine.   Neurological: She is alert and oriented to person, place, and time.  Skin: Skin is warm and dry.  Psychiatric: She has a normal mood and affect. Her behavior is normal.  Nursing note and vitals reviewed.  Vitals:   07/12/16 1729  BP: (!) 142/92  Pulse: (!) 102  Resp: 17  SpO2: 98%  Weight: 114 lb (51.7 kg)  Height: '5\' 4"'$  (1.626 m)    EKG reading done by Marissa Agreste, MD: Sinus rhythm rate of 91. Some baseline artifact but no apparent acute findings.   Lab Results  Component Value Date   WBC 11.2 (A) 07/12/2016   HGB 12.9 07/12/2016   HCT 38.1 07/12/2016   MCV 94.1 07/12/2016   PLT 389 06/18/2016     Assessment & Plan:   Laiza Veenstra is a 66 y.o. female Leukocytosis, unspecified type - Plan: POCT  CBC, CT Abdomen Pelvis W Contrast Chills - Plan: POCT CBC, Nausea without vomiting - Plan: Lipase, CT Abdomen Pelvis W Contrast Abdominal pain, unspecified abdominal location - Plan: Ambulatory referral to Gastroenterology, Comprehensive metabolic panel, Lipase, CT Abdomen Pelvis W Contrast Diarrhea, unspecified type - Plan: GI Profile, Stool, PCR, CT Abdomen Pelvis W Contrast History  of gastrointestinal bleeding - Plan: Ambulatory referral to Gastroenterology, CT Abdomen Pelvis W Contrast  - Improved WBCs versus ER visit, but still mildly elevated. Long-standing abdominal pain since hospital discharge, but with associated diarrhea and history of colitis, CT scan was obtained. Instructed patient to collect stool specimen for C. difficile testing. She left office without this collection kit. Was advised the following day to return to pick it up, but she wasn't able to do so until this upcoming Monday May 7th. CT results noted - suspected component of colitis, and prior peptic ulcer disease without acute ulcer or perforation.  -Will have her return first thing Monday morning to collect stool specimen for C. difficile, and likely start Cipro/Flagyl, along with likely coordination of care with gastroenterology.   -Attempted her mobile number which was not in service. Attempted home number, and phone continued to ring, unable to leave a message to discuss this plan. We'll try again tomorrow morning.   Heart palpitations - Plan: Ambulatory referral to Cardiology, EKG 12-Lead  - EKG in office without acute findings. We'll refer to cardiology for further evaluation, RTC/ER precautions if acute worsening.  Back pain, unspecified back location, unspecified back pain laterality, unspecified chronicity - Plan: carisoprodol (SOMA) 350 MG tablet Neck pain - Plan: Ambulatory referral to Orthopedic Surgery, carisoprodol (SOMA) 350 MG tablet Torticollis - Plan: Ambulatory referral to Orthopedic Surgery, carisoprodol  (SOMA) 350 MG tablet Muscle spasm - Plan: carisoprodol (SOMA) 350 MG tablet  - Suspected muscle spasm/torticollis as above. Did have some relief with Soma previously. Agreed on short-term prescription for Soma while waiting for orthopedic eval. Referral placed. RTC/ER precautions if worsening neck pain/stiffness, fever, chills, photophobia/phonophobia or headache.   Meds ordered this encounter  Medications  . carisoprodol (SOMA) 350 MG tablet    Sig: Take 1 tablet (350 mg total) by mouth 3 (three) times daily as needed for muscle spasms.    Dispense:  30 tablet    Refill:  0   Patient Instructions   For muscle spasm and neck/back pains I did refer you to orthopaedist as you may need further imaging or possible physical therapy. I did provide Soma for now up to 3 times per day, but would not want to use that medication long term.   I referred you to cardiology for your palpitations, but if any worsening of those symptoms, chest pain or shortness of breath prior to that visit - be seen here, emergency room or call 911 if needed.   For your abdominal pain and diarrhea, I will order a CT scan of your abdomen as infection fighting cells are still slightly elevated.  I will check other testing including test for C. Diff bacteria. I also referred you to gastroenterology for further evaluation of abdominal pain and workup of your prior gastrointestinal bleeding.   Return to the clinic or go to the nearest emergency room if any of your symptoms worsen or new symptoms occur.  Recheck with me next week, unless I let you know other plan based on testing above.     Muscle Cramps and Spasms Muscle cramps and spasms occur when a muscle or muscles tighten and you have no control over this tightening (involuntary muscle contraction). They are a common problem and can develop in any muscle. The most common place is in the calf muscles of the leg. Muscle cramps and muscle spasms are both involuntary muscle  contractions, but there are some differences between the two:  Muscle cramps are painful. They  come and go and may last a few seconds to 15 minutes. Muscle cramps are often more forceful and last longer than muscle spasms.  Muscle spasms may or may not be painful. They may also last just a few seconds or much longer. Certain medical conditions, such as diabetes or Parkinson disease, can make it more likely to develop cramps or spasms. However, cramps or spasms are usually not caused by a serious underlying problem. Common causes include:  Overexertion.  Overuse from repetitive motions, or doing the same thing over and over.  Remaining in a certain position for a long period of time.  Improper preparation, form, or technique while playing a sport or doing an activity.  Dehydration.  Injury.  Side effects of some medicines.  Abnormally low levels of the salts and ions in your blood (electrolytes), especially potassium and calcium. This could happen if you are taking water pills (diuretics) or if you are pregnant. In many cases, the cause of muscle cramps or spasms is unknown. Follow these instructions at home:  Stay well hydrated. Drink enough fluid to keep your urine clear or pale yellow.  Try massaging, stretching, and relaxing the affected muscle.  If directed, apply heat to tight or tense muscles as often as told by your health care provider. Use the heat source that your health care provider recommends, such as a moist heat pack or a heating pad.  Place a towel between your skin and the heat source.  Leave the heat on for 20-30 minutes.  Remove the heat if your skin turns bright red. This is especially important if you are unable to feel pain, heat, or cold. You may have a greater risk of getting burned.  If directed, put ice on the affected area. This may help if you are sore or have pain after a cramp or spasm.  Put ice in a plastic bag.  Place a towel between your skin  and the bag.  Leavethe ice on for 20 minutes, 2-3 times a day.  Take over-the-counter and prescription medicines only as told by your health care provider.  Pay attention to any changes in your symptoms. Contact a health care provider if:  Your cramps or spasms get more severe or happen more often.  Your cramps or spasms do not improve over time. This information is not intended to replace advice given to you by your health care provider. Make sure you discuss any questions you have with your health care provider. Document Released: 08/17/2001 Document Revised: 03/29/2015 Document Reviewed: 11/29/2014 Elsevier Interactive Patient Education  2017 Elsevier Inc.   Abdominal Pain, Adult Abdominal pain can be caused by many things. Often, abdominal pain is not serious and it gets better with no treatment or by being treated at home. However, sometimes abdominal pain is serious. Your health care provider will do a medical history and a physical exam to try to determine the cause of your abdominal pain. Follow these instructions at home:  Take over-the-counter and prescription medicines only as told by your health care provider. Do not take a laxative unless told by your health care provider.  Drink enough fluid to keep your urine clear or pale yellow.  Watch your condition for any changes.  Keep all follow-up visits as told by your health care provider. This is important. Contact a health care provider if:  Your abdominal pain changes or gets worse.  You are not hungry or you lose weight without trying.  You are constipated or  have diarrhea for more than 2-3 days.  You have pain when you urinate or have a bowel movement.  Your abdominal pain wakes you up at night.  Your pain gets worse with meals, after eating, or with certain foods.  You are throwing up and cannot keep anything down.  You have a fever. Get help right away if:  Your pain does not go away as soon as your  health care provider told you to expect.  You cannot stop throwing up.  Your pain is only in areas of the abdomen, such as the right side or the left lower portion of the abdomen.  You have bloody or black stools, or stools that look like tar.  You have severe pain, cramping, or bloating in your abdomen.  You have signs of dehydration, such as:  Dark urine, very little urine, or no urine.  Cracked lips.  Dry mouth.  Sunken eyes.  Sleepiness.  Weakness. This information is not intended to replace advice given to you by your health care provider. Make sure you discuss any questions you have with your health care provider. Document Released: 12/05/2004 Document Revised: 09/15/2015 Document Reviewed: 08/09/2015 Elsevier Interactive Patient Education  2017 [employer].    Palpitations A palpitation is the feeling that your heartbeat is irregular or is faster than normal. It may feel like your heart is fluttering or skipping a beat. Palpitations are usually not a serious problem. They may be caused by many things, including smoking, caffeine, alcohol, stress, and certain medicines. Although most causes of palpitations are not serious, palpitations can be a sign of a serious medical problem. In some cases, you may need further medical evaluation. Follow these instructions at home: Pay attention to any changes in your symptoms. Take these actions to help with your condition:  Avoid the following:  Caffeinated coffee, tea, soft drinks, diet pills, and energy drinks.  Chocolate.  Alcohol.  Do not use any tobacco products, such as cigarettes, chewing tobacco, and e-cigarettes. If you need help quitting, ask your health care provider.  Try to reduce your stress and anxiety. Things that can help you relax include:  Yoga.  Meditation.  Physical activity, such as swimming, jogging, or walking.  Biofeedback. This is a method that helps you learn to use your mind to control  things in your body, such as your heartbeats.  Get plenty of rest and sleep.  Take over-the-counter and prescription medicines only as told by your health care provider.  Keep all follow-up visits as told by your health care provider. This is important. Contact a health care provider if:  You continue to have a fast or irregular heartbeat after 24 hours.  Your palpitations occur more often. Get help right away if:  You have chest pain or shortness of breath.  You have a severe headache.  You feel dizzy or you faint. This information is not intended to replace advice given to you by your health care provider. Make sure you discuss any questions you have with your health care provider. Document Released: 02/23/2000 Document Revised: 07/31/2015 Document Reviewed: 11/10/2014 Elsevier Interactive Patient Education  2017 [employer].  IF you received an x-ray today, you will receive an invoice from [facility]. Please contact [facility] at [phone] with questions or concerns regarding your invoice.   IF you received labwork today, you will receive an invoice from [facility]. Please contact [facility] at [phone] with questions or concerns regarding your invoice.   Our billing staff will  not be able to assist you with questions regarding bills from these companies.  You will be contacted with the lab results as soon as they are available. The fastest way to get your results is to activate your My Chart account. Instructions are located on the last page of this paperwork. If you have not heard from [place] regarding the results in 2 weeks, please contact this office.      I personally performed the services described in this documentation, which was scribed in my presence. The recorded information has been reviewed and considered for accuracy and completeness, addended by me as needed, and agree with information above.  Signed,   [name], MD Primary Care at  [facility].  07/14/16 3:09 PM

## 2016-07-13 ENCOUNTER — Ambulatory Visit (HOSPITAL_COMMUNITY)
Admission: RE | Admit: 2016-07-13 | Discharge: 2016-07-13 | Disposition: A | Discharge status: Home / Self Care | Payer: Medicare Other | Source: Ambulatory Visit | Attending: Family Medicine | Admitting: Family Medicine

## 2016-07-13 ENCOUNTER — Ambulatory Visit (HOSPITAL_COMMUNITY)
Admission: RE | Admit: 2016-07-13 | Discharge: 2016-07-13 | Disposition: A | Discharge status: Home / Self Care | Payer: Medicare Other | Source: Intra-hospital | Attending: Diagnostic Radiology | Admitting: Diagnostic Radiology

## 2016-07-13 DIAGNOSIS — R197 Diarrhea, unspecified: Secondary | ICD-10-CM | POA: Insufficient documentation

## 2016-07-13 DIAGNOSIS — R933 Abnormal findings on diagnostic imaging of other parts of digestive tract: Secondary | ICD-10-CM | POA: Diagnosis not present

## 2016-07-13 DIAGNOSIS — D72829 Elevated white blood cell count, unspecified: Secondary | ICD-10-CM | POA: Diagnosis not present

## 2016-07-13 DIAGNOSIS — R110 Nausea: Secondary | ICD-10-CM | POA: Insufficient documentation

## 2016-07-13 DIAGNOSIS — R1011 Right upper quadrant pain: Secondary | ICD-10-CM | POA: Insufficient documentation

## 2016-07-13 DIAGNOSIS — Z8719 Personal history of other diseases of the digestive system: Secondary | ICD-10-CM | POA: Insufficient documentation

## 2016-07-13 DIAGNOSIS — Z90710 Acquired absence of both cervix and uterus: Secondary | ICD-10-CM | POA: Diagnosis not present

## 2016-07-13 DIAGNOSIS — R109 Unspecified abdominal pain: Secondary | ICD-10-CM

## 2016-07-13 DIAGNOSIS — R1030 Lower abdominal pain, unspecified: Secondary | ICD-10-CM | POA: Diagnosis not present

## 2016-07-13 DIAGNOSIS — R1031 Right lower quadrant pain: Secondary | ICD-10-CM | POA: Diagnosis not present

## 2016-07-13 LAB — COMPREHENSIVE METABOLIC PANEL
ALT: 14 IU/L (ref 0–32)
AST: 15 IU/L (ref 0–40)
Albumin/Globulin Ratio: 1.9 (ref 1.2–2.2)
Albumin: 4.3 g/dL (ref 3.6–4.8)
Alkaline Phosphatase: 127 IU/L — ABNORMAL HIGH (ref 39–117)
BUN/Creatinine Ratio: 23 (ref 12–28)
BUN: 15 mg/dL (ref 8–27)
Bilirubin Total: <0.2 mg/dL (ref 0.0–1.2)
CO2: 22 mmol/L (ref 18–29)
Calcium: 9.6 mg/dL (ref 8.7–10.3)
Chloride: 105 mmol/L (ref 96–106)
Creatinine, Ser: 0.64 mg/dL (ref 0.57–1.00)
GFR calc Af Amer: 109 mL/min/{1.73_m2} (ref >59)
GFR calc non Af Amer: 95 mL/min/{1.73_m2} (ref >59)
Globulin, Total: 2.3 g/dL (ref 1.5–4.5)
Glucose: 100 mg/dL — ABNORMAL HIGH (ref 65–99)
Potassium: 4.2 mmol/L (ref 3.5–5.2)
Sodium: 144 mmol/L (ref 134–144)
Total Protein: 6.6 g/dL (ref 6.0–8.5)

## 2016-07-13 LAB — LIPASE: Lipase: 53 U/L (ref 14–72)

## 2016-07-13 MED ORDER — IOPAMIDOL (ISOVUE-300) INJECTION 61%
100.0000 mL | Freq: Once | INTRAVENOUS | Status: AC | PRN
  Administered 2016-07-13: 100 mL via INTRAVENOUS

## 2016-07-13 MED ORDER — IOPAMIDOL (ISOVUE-300) INJECTION 61%
INTRAVENOUS | Status: AC
  Filled 2016-07-13: qty 30

## 2016-07-13 MED ORDER — IOPAMIDOL (ISOVUE-300) INJECTION 61%
INTRAVENOUS | Status: AC
  Filled 2016-07-13: qty 100

## 2016-07-13 NOTE — Progress Notes (Signed)
Scheduled for ct scan 07/13/16 at [facility]  Pt aware and pre auth in process by annie

## 2016-07-15 ENCOUNTER — Encounter: Payer: Self-pay | Admitting: Family Medicine

## 2016-07-15 ENCOUNTER — Ambulatory Visit: Payer: Medicare Other

## 2016-07-15 ENCOUNTER — Ambulatory Visit (INDEPENDENT_AMBULATORY_CARE_PROVIDER_SITE_OTHER): Payer: Medicare Other

## 2016-07-15 ENCOUNTER — Telehealth: Payer: Self-pay

## 2016-07-15 ENCOUNTER — Telehealth: Payer: Self-pay | Admitting: Gastroenterology

## 2016-07-15 ENCOUNTER — Encounter (HOSPITAL_COMMUNITY): Payer: Self-pay

## 2016-07-15 ENCOUNTER — Encounter: Payer: Self-pay | Admitting: Physician Assistant

## 2016-07-15 ENCOUNTER — Ambulatory Visit (INDEPENDENT_AMBULATORY_CARE_PROVIDER_SITE_OTHER): Payer: Medicare Other | Admitting: Family Medicine

## 2016-07-15 VITALS — BP 156/74 | HR 95 | Temp 98.9°F | Resp 16 | Ht 64.0 in | Wt 114.0 lb

## 2016-07-15 DIAGNOSIS — R6883 Chills (without fever): Secondary | ICD-10-CM | POA: Diagnosis not present

## 2016-07-15 DIAGNOSIS — K279 Peptic ulcer, site unspecified, unspecified as acute or chronic, without hemorrhage or perforation: Secondary | ICD-10-CM

## 2016-07-15 DIAGNOSIS — R05 Cough: Secondary | ICD-10-CM

## 2016-07-15 DIAGNOSIS — R059 Cough, unspecified: Secondary | ICD-10-CM

## 2016-07-15 DIAGNOSIS — K529 Noninfective gastroenteritis and colitis, unspecified: Secondary | ICD-10-CM

## 2016-07-15 DIAGNOSIS — R1084 Generalized abdominal pain: Secondary | ICD-10-CM

## 2016-07-15 DIAGNOSIS — R197 Diarrhea, unspecified: Secondary | ICD-10-CM | POA: Diagnosis not present

## 2016-07-15 DIAGNOSIS — R062 Wheezing: Secondary | ICD-10-CM

## 2016-07-15 DIAGNOSIS — I639 Cerebral infarction, unspecified: Secondary | ICD-10-CM

## 2016-07-15 DIAGNOSIS — Z5321 Procedure and treatment not carried out due to patient leaving prior to being seen by health care provider: Secondary | ICD-10-CM | POA: Diagnosis not present

## 2016-07-15 DIAGNOSIS — K5730 Diverticulosis of large intestine without perforation or abscess without bleeding: Secondary | ICD-10-CM | POA: Diagnosis not present

## 2016-07-15 LAB — COMPREHENSIVE METABOLIC PANEL WITH GFR
ALT: 12 U/L — ABNORMAL LOW (ref 14–54)
AST: 16 U/L (ref 15–41)
Albumin: 3.7 g/dL (ref 3.5–5.0)
Alkaline Phosphatase: 102 U/L (ref 38–126)
Anion gap: 9 (ref 5–15)
BUN: 16 mg/dL (ref 6–20)
CO2: 26 mmol/L (ref 22–32)
Calcium: 9.2 mg/dL (ref 8.9–10.3)
Chloride: 109 mmol/L (ref 101–111)
Creatinine, Ser: 1.12 mg/dL — ABNORMAL HIGH (ref 0.44–1.00)
GFR calc Af Amer: 59 mL/min — ABNORMAL LOW
GFR calc non Af Amer: 51 mL/min — ABNORMAL LOW
Glucose, Bld: 96 mg/dL (ref 65–99)
Potassium: 4.3 mmol/L (ref 3.5–5.1)
Sodium: 144 mmol/L (ref 135–145)
Total Bilirubin: 0.4 mg/dL (ref 0.3–1.2)
Total Protein: 6.2 g/dL — ABNORMAL LOW (ref 6.5–8.1)

## 2016-07-15 LAB — URINALYSIS, ROUTINE W REFLEX MICROSCOPIC
Bacteria, UA: NONE SEEN
Bilirubin Urine: NEGATIVE
Glucose, UA: NEGATIVE mg/dL
Hgb urine dipstick: NEGATIVE
Ketones, ur: 5 mg/dL — AB
Nitrite: NEGATIVE
Protein, ur: NEGATIVE mg/dL
Specific Gravity, Urine: 1.025 (ref 1.005–1.030)
pH: 7 (ref 5.0–8.0)

## 2016-07-15 LAB — POCT CBC
Granulocyte percent: 60.6 %G (ref 37–80)
HCT, POC: 37.9 % (ref 37.7–47.9)
Hemoglobin: 12.9 g/dL (ref 12.2–16.2)
Lymph, poc: 3.6 — AB (ref 0.6–3.4)
MCH, POC: 31.7 pg — AB (ref 27–31.2)
MCHC: 34.1 g/dL (ref 31.8–35.4)
MCV: 92.9 fL (ref 80–97)
MID (cbc): 0.8 (ref 0–0.9)
MPV: 7.1 fL (ref 0–99.8)
POC Granulocyte: 6.7 (ref 2–6.9)
POC LYMPH PERCENT: 32 %L (ref 10–50)
POC MID %: 7.4 %M (ref 0–12)
Platelet Count, POC: 432 10*3/uL — AB (ref 142–424)
RBC: 4.07 M/uL (ref 4.04–5.48)
RDW, POC: 15.9 %
WBC: 11.1 10*3/uL — AB (ref 4.6–10.2)

## 2016-07-15 LAB — CBC
HCT: 37.6 % (ref 36.0–46.0)
Hemoglobin: 12.1 g/dL (ref 12.0–15.0)
MCH: 30.6 pg (ref 26.0–34.0)
MCHC: 32.2 g/dL (ref 30.0–36.0)
MCV: 95.2 fL (ref 78.0–100.0)
Platelets: 410 10*3/uL — ABNORMAL HIGH (ref 150–400)
RBC: 3.95 MIL/uL (ref 3.87–5.11)
RDW: 14.9 % (ref 11.5–15.5)
WBC: 9.3 10*3/uL (ref 4.0–10.5)

## 2016-07-15 LAB — LIPASE, BLOOD: Lipase: 38 U/L (ref 11–51)

## 2016-07-15 MED ORDER — METRONIDAZOLE 500 MG PO TABS: 500.0000 mg | ORAL_TABLET | Freq: Two times a day (BID) | ORAL | 0 refills | Status: DC

## 2016-07-15 MED ORDER — VANCOMYCIN HCL 125 MG PO CAPS: 125.0000 mg | ORAL_CAPSULE | Freq: Four times a day (QID) | ORAL | 0 refills | Status: DC

## 2016-07-15 MED ORDER — ALBUTEROL SULFATE HFA 108 (90 BASE) MCG/ACT IN AERS: 1.0000 | INHALATION_SPRAY | RESPIRATORY_TRACT | 0 refills | Status: DC | PRN

## 2016-07-15 MED ORDER — CIPROFLOXACIN HCL 500 MG PO TABS: 500.0000 mg | ORAL_TABLET | Freq: Two times a day (BID) | ORAL | 0 refills | Status: DC

## 2016-07-15 NOTE — Telephone Encounter (Signed)
Pt husband called back, she will come in today at 1130 to discuss results

## 2016-07-15 NOTE — Telephone Encounter (Signed)
On Saturday husband called at 4 stating they would not be able to come in for stool culture til Monday. They have not come in Monday morning and actually will need an ov to discuss results as Dr Carlota Raspberry unable to reach this weekend. Front staff is aware. I called cell and it is d/c I called home (which is husbands and emergency contact # and n/a or v/m) I will keep trying to call.

## 2016-07-15 NOTE — Telephone Encounter (Signed)
Dr. Carlota Raspberry is asking to speak with you.  I am unable to pull the information from the visits from 1999 (too old).  She is scheduled to see Ellouise Newer, PA on 07/23/16

## 2016-07-15 NOTE — Patient Instructions (Addendum)
For wheezing and cough - there was no concern on your chest xray. Ok to use albuterol 1-2 puffs at a time up to every 4-6 hours as needed. If you need that medicine sooner than 4 hours, or persistently need medicine more than 2-3 times per day, may need different treatment.  Start vancomycin oral every 6 hours for current colitis. Once I see the results from your stool culture, can change meds if needed. After speaking with gastroenterology, it may be best that you initially follow-up with your gastroenterologist at [facility].  Either way, follow-up with me in 3 days. Sooner if worse.  Bronchospasm, Adult Bronchospasm is a tightening of the airways going into the lungs. During an episode, it may be harder to breathe. You may cough, and you may make a whistling sound when you breathe (wheeze). This condition often affects people with asthma. What are the causes? This condition is caused by swelling and irritation in the airways. It can be triggered by:  An infection (common).  Seasonal allergies.  An allergic reaction.  Exercise.  Irritants. These include pollution, cigarette smoke, strong odors, aerosol sprays, and paint fumes.  Weather changes. Winds increase molds and pollens in the air. Cold air may cause swelling.  Stress and emotional upset. What are the signs or symptoms? Symptoms of this condition include:  Wheezing. If the episode was triggered by an allergy, wheezing may start right away or hours later.  Nighttime coughing.  Frequent or severe coughing with a simple cold.  Chest tightness.  Shortness of breath.  Decreased ability to exercise. How is this diagnosed? This condition is usually diagnosed with a review of your medical history and a physical exam. Tests, such as lung function tests, are sometimes done to look for other conditions. The need for a chest X-ray depends on where the wheezing occurs and whether it is the first time you have wheezed. How is this  treated? This condition may be treated with:  Inhaled medicines. These open up the airways and help you breathe. They can be taken with an inhaler or a nebulizer device.  Corticosteroid medicines. These may be given for severe bronchospasm, usually when it is associated with asthma.  Avoiding triggers, such as irritants, infection, or allergies. Follow these instructions at home: Medicines   Take over-the-counter and prescription medicines only as told by your health care provider.  If you need to use an inhaler or nebulizer to take your medicine, ask your health care provider to explain how to use it correctly. If you were given a spacer, always use it with your inhaler. Lifestyle   Reduce the number of triggers in your home. To do this:  Change your heating and air conditioning filter at least once a month.  Limit your use of fireplaces and wood stoves.  Do not smoke. Do not allow smoking in your home.  Avoid using perfumes and fragrances.  Get rid of pests, such as roaches and mice, and their droppings.  Remove any mold from your home.  Keep your house clean and dust free. Use unscented cleaning products.  Replace carpet with wood, tile, or vinyl flooring. Carpet can trap dander and dust.  Use allergy-proof pillows, mattress covers, and box spring covers.  Wash bed sheets and blankets every week in hot water. Dry them in a dryer.  Use blankets that are made of polyester or cotton.  Wash your hands often.  Do not allow pets in your bedroom.  Avoid breathing in cold air  when you exercise. General instructions   Have a plan for seeking medical care. Know when to call your health care provider and local emergency services, and where to get emergency care.  Stay up to date on your immunizations.  When you have an episode of bronchospasm, stay calm. Try to relax and breathe more slowly.  If you have asthma, make sure you have an asthma action plan.  Keep all  follow-up visits as told by your health care provider. This is important. Contact a health care provider if:  You have muscle aches.  You have chest pain.  The mucus that you cough up (sputum) changes from clear or white to yellow, green, gray, or bloody.  You have a fever.  Your sputum gets thicker. Get help right away if:  Your wheezing and coughing get worse, even after you take your prescribed medicines.  It gets even harder to breathe.  You develop severe chest pain. Summary  Bronchospasm is a tightening of the airways going into the lungs.  During an episode of bronchospasm, you may have a harder time breathing. You may cough and make a whistling sound when you breathe (wheeze).  Avoid exposure to triggers such as smoke, dust, mold, animal dander, and fragrances.  When you have an episode of bronchospasm, stay calm. Try to relax and breathe more slowly. This information is not intended to replace advice given to you by your health care provider. Make sure you discuss any questions you have with your health care provider. Document Released: 02/28/2003 Document Revised: 02/22/2016 Document Reviewed: 02/22/2016 Elsevier Interactive Patient Education  2017 [employer]  Colitis Colitis is inflammation of the colon. Colitis may last a short time (acute) or it may last a long time (chronic). What are the causes? This condition may be caused by:  Viruses.  Bacteria.  Reactions to medicine.  Certain autoimmune diseases, such as Crohn disease or ulcerative colitis. What are the signs or symptoms? Symptoms of this condition include:  Diarrhea.  Passing bloody or tarry stool.  Pain.  Fever.  Vomiting.  Tiredness (fatigue).  Weight loss.  Bloating.  Sudden increase in abdominal pain.  Having fewer bowel movements than usual. How is this diagnosed? This condition is diagnosed with a stool test or a blood test. You may also have other tests, including  X-rays, a CT scan, or a colonoscopy. How is this treated? Treatment may include:  Resting the bowel. This involves not eating or drinking for a period of time.  Fluids that are given through an IV tube.  Medicine for pain and diarrhea.  Antibiotic medicines.  Cortisone medicines.  Surgery. Follow these instructions at home: Eating and drinking   Follow instructions from your health care provider about eating or drinking restrictions.  Drink enough fluid to keep your urine clear or pale yellow.  Work with a dietitian to determine which foods cause your condition to flare up.  Avoid foods that cause flare-ups.  Eat a well-balanced diet. Medicines   Take over-the-counter and prescription medicines only as told by your health care provider.  If you were prescribed an antibiotic medicine, take it as told by your health care provider. Do not stop taking the antibiotic even if you start to feel better. General instructions   Keep all follow-up visits as told by your health care provider. This is important. Contact a health care provider if:  Your symptoms do not go away.  You develop new symptoms. Get help right away if:  You have a fever that does not go away with treatment.  You develop chills.  You have extreme weakness, fainting, or dehydration.  You have repeated vomiting.  You develop severe pain in your abdomen.  You pass bloody or tarry stool. This information is not intended to replace advice given to you by your health care provider. Make sure you discuss any questions you have with your health care provider. Document Released: 04/04/2004 Document Revised: 08/03/2015 Document Reviewed: 06/20/2014 Elsevier Interactive Patient Education  2017 [employer].    IF you received an x-ray today, you will receive an invoice from [facility]. Please contact [facility] at [phone] with questions or concerns regarding your invoice.   IF you  received labwork today, you will receive an invoice from [facility]. Please contact [facility] at [phone] with questions or concerns regarding your invoice.   Our billing staff will not be able to assist you with questions regarding bills from these companies.  You will be contacted with the lab results as soon as they are available. The fastest way to get your results is to activate your My Chart account. Instructions are located on the last page of this paperwork. If you have not heard from [place] regarding the results in 2 weeks, please contact this office.     \

## 2016-07-15 NOTE — Telephone Encounter (Signed)
I returned a call to Dr. Carlota Raspberry at [phone]. Apparently I had contact with this patient in 1999 although we cannot find any records. Dr. Carlota Raspberry is concerned about pts new diarrhea, abdominal pain, abnormal abd CT. I briefly reviewed her recent records in Epic and discussed her care with Dr. Carlota Raspberry.  She had a complicated hospitalization with meningitis at [facility] from 3/6-3/15/2018 and underwent evaluation including EGD for GI bleeding with outpatient plans for colonoscopy. I cannot access the [facility] EGD report. Pt had a prolonged course of antibiotics. Pt is now having abdominal pain and diarrhea. Given her complex illness, her established GI MDs at [facility] and no EGD report. Dr. Carlota Raspberry is sending stool for C diff and GI pathogen panel. Dr. Carlota Raspberry was appropriately concerned about C diff and wants to treat with vanco pending stool studies which is reasonable. I advised Dr. Carlota Raspberry to contact [facility], or have pt do so, for her acute GI care and acute follow up. She can establish with [facility], if she desires, at a later date when her acute GI problems have resolved.

## 2016-07-15 NOTE — Progress Notes (Addendum)
By signing my name below, I, [name], attest that this documentation has been prepared under the direction and in the presence of Marissa Ray, MD.  Electronically Signed: Verlee Cardenas, Medical Scribe. 07/15/16. 12:41 PM.  Subjective:    Patient ID: [name], female    DOB: June 16, 1950, 66 y.o.   MRN: [id number]  HPI Chief Complaint  Patient presents with  . Follow-up    on Cat scan results     HPI Comments: [name] is a 66 y.o. female who presents to Primary Care at [facility] for follow-up. See last office visit on Friday. Multiple concerns addressed but did have abdominal pain for the past approximately 1 month and diarrhea multiple times a day. Recently was on IV abx for meningitis. She had a slight leucocytosis last visit but had improved from prior reading in ER. WBC was 11.2 down from 15.7 at visit 3 days ago, initial plan of stool for GI pathogen testing, but unfortunately she left without the collection kit and was unable to return Saturday for that test. She had a CT abdomen 2 days ago indicating likely colitis from cecum, proximal to mid descending colon, no abscess or obstruction. She also had wall thickening of distal stomach and duodenum. Suggestive of PUD without ulceration or perforation.  GI: She feels worse today with 3 bowel movements today, and 5 bowel movements yesterday. Reports worsening associated sxs of diarrhea about 4x a day, chills, and radiating right arm pain that's going to her shoulder for the past week. In the past, she had a GI bleed while still taking abx for meningitis, but diarrhea started after the completion of abx. Pt has an appt with GI in 9 days. Denies fever.  Cough: She reports unrelated sxs of chills, sleep disturbance, cough wheezing for the past 2 weeks.  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 She has to go back and get more removed.   Patient Active Problem List   Diagnosis Date Noted  . Acute encephalopathy   . Fever   . Left hemiparesis ([place])   . Generalized OA   . Fibromyalgia   . Tobacco abuse   . Substance abuse   . Benign essential HTN   . Tachycardia   . Chronic pain syndrome   . Acute blood loss anemia   . Leukocytosis   . Septic thrombophlebitis of sagittal sinus   . Acute respiratory failure ([place])   . Streptococcus pneumoniae meningitis   . Streptococcal bacteremia   . Meningitis   . Cerebral embolism with cerebral infarction 04/12/2016  . Stroke (cerebrum) ([place]) 04/12/2016  . Neck pain 04/04/2016  . Chronic abdominal pain 02/19/2016  . Essential hypertension 02/19/2016  . Bacterial conjunctivitis 02/19/2016  . Health care maintenance 02/19/2016  . ANXIETY 05/08/2006  . TOBACCO DEPENDENCE 05/08/2006  . ASTHMA, UNSPECIFIED 05/08/2006  . GASTROESOPHAGEAL REFLUX, NO ESOPHAGITIS 05/08/2006  . CONSTIPATION 05/08/2006  . CONVULSIONS, SEIZURES, NOS 05/08/2006   Past Medical History:  Diagnosis Date  . Arrhythmia   . Arthritis   . Fibromyalgia   . GERD (gastroesophageal reflux disease)   . H/O: substance abuse 2005   narcotic usage due to chronic back pain  . Heart murmur   . Hypertension   . PONV (postoperative nausea and vomiting)    Past Surgical History:  Procedure Laterality Date  . ABDOMINAL HYSTERECTOMY    . ABDOMINAL SURGERY    . APPENDECTOMY    .  BREAST SURGERY    . CHOLECYSTECTOMY N/A 09/30/2012   Procedure: LAPAROSCOPIC CHOLECYSTECTOMY WITH INTRAOPERATIVE CHOLANGIOGRAM;  Surgeon: Imogene Burn. Georgette Dover, MD;  Location: [facility]S;  Service: General;  Laterality: N/A;  . FRACTURE SURGERY     right upper arm  . OVARIAN CYST SURGERY     Allergies  Allergen Reactions  . Codeine Nausea And Vomiting  . Nsaids Other (See Comments)    Stomach ulcers   Prior to Admission medications   Medication Sig Start Date End Date Taking? Authorizing Provider  carisoprodol (SOMA) 350 MG tablet Take 1 tablet (350 mg total) by mouth 3  (three) times daily as needed for muscle spasms. 07/12/16   [name], MD  carvedilol (COREG) 3.125 MG tablet Take by mouth. 05/28/16 05/28/17  [provider]  pantoprazole (PROTONIX) 40 MG tablet Take 1 tablet (40 mg total) by mouth at bedtime. 04/22/16 05/22/16  [name], MD  potassium chloride (K-DUR,KLOR-CON) 10 MEQ tablet Take by mouth. 05/28/16 05/28/17  [provider]  pregabalin (LYRICA) 50 MG capsule Take by mouth. 06/06/16 06/06/17  [provider]   Social History   Social History  . Marital status: Married    Spouse name: N/A  . Number of children: N/A  . Years of education: N/A   Occupational History  . Not on file.   Social History Main Topics  . Smoking status: Current Every Day Smoker    Packs/day: 1.00    Years: 30.00    Types: Cigarettes  . Smokeless tobacco: Never Used  . Alcohol use No  . Drug use: No     Comment: Methadone for narcotic use  . Sexual activity: Not on file   Other Topics Concern  . Not on file   Social History Narrative  . No narrative on file   Review of Systems  Constitutional: Positive for chills. Negative for fever.  Respiratory: Positive for cough and wheezing.   Gastrointestinal: Positive for diarrhea.  Musculoskeletal: Positive for arthralgias.  Psychiatric/Behavioral: Positive for sleep disturbance.   Objective:  Physical Exam  Constitutional: She appears well-developed and well-nourished. No distress.  HENT:  Head: Normocephalic and atraumatic.  Eyes: Conjunctivae are normal.  Neck: Neck supple.  Cardiovascular: Normal rate.   Pulmonary/Chest: Effort normal. She has wheezes.  Abdominal: Bowel sounds are increased. There is tenderness in the right upper quadrant, right lower quadrant and left upper quadrant.  Tender on the LUQ>RUQ  Neurological: She is alert.  Skin: Skin is warm and dry.  Psychiatric: She has a normal mood and affect. Her behavior is normal.  Nursing note and vitals  reviewed.   Vitals:   07/15/16 1140  BP: (!) 159/81  Pulse: 95  Resp: 16  Temp: 98.9 F (37.2 C)  TempSrc: Oral  SpO2: 96%  Weight: 114 lb (51.7 kg)  Height: 5' 4" (1.626 m)   Body mass index is 19.57 kg/m.   Results for orders placed or performed in visit on 07/15/16  POCT CBC  Result Value Ref Range   WBC 11.1 (A) 4.6 - 10.2 K/uL   Lymph, poc 3.6 (A) 0.6 - 3.4   POC LYMPH PERCENT 32.0 10 - 50 %L   MID (cbc) 0.8 0 - 0.9   POC MID % 7.4 0 - 12 %M   POC Granulocyte 6.7 2 - 6.9   Granulocyte percent 60.6 37 - 80 %G   RBC 4.07 4.04 - 5.48 M/uL   Hemoglobin 12.9 12.2 - 16.2 g/dL  HCT, POC 37.9 37.7 - 47.9 %   MCV 92.9 80 - 97 fL   MCH, POC 31.7 (A) 27 - 31.2 pg   MCHC 34.1 31.8 - 35.4 g/dL   RDW, POC 15.9 %   Platelet Count, POC 432 (A) 142 - 424 K/uL   MPV 7.1 0 - 99.8 fL   Dg Chest 1 View  Result Date: 07/15/2016 CLINICAL DATA:  Cough and wheezing EXAM: CHEST 1 VIEW COMPARISON:  Frontal chest radiograph obtained earlier in the day FINDINGS: Lateral view obtained. On the lateral view, lungs are clear. The cardiac silhouette is normal. No adenopathy evident. No bone lesions. IMPRESSION: No evident edema or consolidation. Electronically Signed   By: Lowella Grip III M.D.   On: 07/15/2016 13:07   Dg Chest 2 View  Result Date: 06/18/2016 CLINICAL DATA:  Dyspnea x3 days EXAM: CHEST  2 VIEW COMPARISON:  CT neck from 04/12/2016 for lung apices, CXR 04/13/2016 FINDINGS: The heart size is normal. Aortic atherosclerosis at the arch is identified without aneurysm. Mild emphysematous hyperinflation of the lungs bilaterally. Apical pleuroparenchymal scarring is seen bilaterally similar to prior CT. No acute osseous appearing abnormality. IMPRESSION: Hyperinflated lungs without acute pneumonic consolidation, CHF, pneumothorax nor effusion. Electronically Signed   By: Ashley Royalty M.D.   On: 06/18/2016 21:31   Ct Abdomen Pelvis W Contrast  Result Date: 07/13/2016 CLINICAL DATA:  Lower  abdominal pain, right flank pain, nausea and diarrhea. History of GI bleed and colitis. EXAM: CT ABDOMEN AND PELVIS WITH CONTRAST TECHNIQUE: Multidetector CT imaging of the abdomen and pelvis was performed using the standard protocol following bolus administration of intravenous contrast. CONTRAST:  175m ISOVUE-300 IOPAMIDOL (ISOVUE-300) INJECTION 61% COMPARISON:  10/18/2010 FINDINGS: Lower chest: No acute abnormality. Hepatobiliary: No focal liver abnormality is seen. Status post cholecystectomy. No biliary dilatation. Pancreas: Unremarkable. No pancreatic ductal dilatation or surrounding inflammatory changes. Spleen: Normal in size without focal abnormality. Adrenals/Urinary Tract: Adrenal glands have a normal appearance. Both kidneys demonstrate scattered small cysts without evidence of solid masses. No hydronephrosis or urinary tract calculi identified. The bladder is moderately distended but otherwise unremarkable. Stomach/Bowel: There is evidence of wall thickening involving the gastric antrum, pyloric region and proximal duodenum. This is not associated with overt evidence of ulceration by CT or free air. Wall thickening is also seen involving the cecum and proximal to mid ascending colon. No evidence of perforation, abscess or bowel obstruction. Vascular/Lymphatic: No significant vascular findings are present. No enlarged abdominal or pelvic lymph nodes. Reproductive: Status post hysterectomy. No adnexal masses. Other: No abdominal wall hernia or abnormality. No abdominopelvic ascites. Musculoskeletal: Progressive degenerative disc disease is present at L4-5. IMPRESSION: 1. Wall thickening involving the distal stomach and proximal duodenum suggestive of peptic ulcer disease. No overt ulceration or evidence of perforation. 2. Wall thickening involving the cecum and proximal to mid descending colon. Findings are consistent with colitis. No associated abscess or bowel obstruction. Electronically Signed   By:  GAletta EdouardM.D.   On: 07/13/2016 14:03   Dg Abd Acute W/chest  Result Date: 07/15/2016 CLINICAL DATA:  Cough, wheezing, generalized abdominal pain EXAM: DG ABDOMEN ACUTE W/ 1V CHEST COMPARISON:  None available FINDINGS: Normal heart size and vascularity. Mild hyperinflation, suggesting COPD/ emphysema. Trachea is midline. No focal pneumonia, collapse or consolidation. Negative for edema, effusion or pneumothorax. Trachea is midline. Bones are osteopenic. No free air evident. Scattered air and residual contrast throughout the bowel. Negative for obstruction or significant ileus. Diverticulosis noted. No renal  calcifications or acute osseous finding. IMPRESSION: No acute finding in the chest. Thoracic aortic atherosclerosis Negative for free air or obstruction Diverticulosis Electronically Signed   By: Jerilynn Mages.  Shick M.D.   On: 07/15/2016 13:08   Assessment & Plan:    [name] is a 66 y.o. female Cough - Plan: POCT CBC, DG Abd Acute W/Chest, DG Chest 1 View, albuterol (PROVENTIL HFA;VENTOLIN HFA) 108 (90 Base) MCG/ACT inhaler Wheezing - Plan: DG Abd Acute W/Chest, DG Chest 1 View, albuterol (PROVENTIL HFA;VENTOLIN HFA) 108 (90 Base) MCG/ACT inhaler  - no infiltrate on CXR. Reactive airway with possible viral illness. Hx of tobacco use, ddx includes element of COPD.   -start with albuterol inh prn. Recheck in few days, ER/RTC precautions if worse.   Generalized abdominal pain - Plan: POCT CBC, DG Abd Acute W/Chest Chills - Plan: POCT CBC Colitis - Plan: vancomycin (VANCOCIN) 125 MG capsule, DISCONTINUED: ciprofloxacin (CIPRO) 500 MG tablet, DISCONTINUED: metroNIDAZOLE (FLAGYL) 500 MG tablet PUD (peptic ulcer disease)  - hx of gastrointestinal bleed with prior hospitalization, signs of PUD on recent CT.  Has been continued on PPI QD.   - colitis on CTabd/pelvis. Prior antibiotic and hospital stay - concerning for C Diff colitis. Unable to obtain stool specimen until today - brought back after  visit. Discussed with GI - initial plan of Vancomycin 165m Q6hr while waiting on stool culture and plan of close follow up in few days with me [facility] GI [facility] and planned for outpatient colonoscopy.   -phone call tonight from answering service - patient in much more pain, worsened chills. Concern for bacteremia, advised to be seen in ER tonight. She plans on calling [phone] if not able to obtain ride.    Over 467mutes with face to face care and coordination of care.  Meds ordered this encounter  Medications  . albuterol (PROVENTIL HFA;VENTOLIN HFA) 108 (90 Base) MCG/ACT inhaler    Sig: Inhale 1-2 puffs into the lungs every 4 (four) hours as needed for wheezing or shortness of breath.    Dispense:  1 Inhaler    Refill:  0  . DISCONTD: ciprofloxacin (CIPRO) 500 MG tablet    Sig: Take 1 tablet (500 mg total) by mouth 2 (two) times daily.    Dispense:  20 tablet    Refill:  0  . DISCONTD: metroNIDAZOLE (FLAGYL) 500 MG tablet    Sig: Take 1 tablet (500 mg total) by mouth 2 (two) times daily.    Dispense:  20 tablet    Refill:  0  . vancomycin (VANCOCIN) 125 MG capsule    Sig: Take 1 capsule (125 mg total) by mouth 4 (four) times daily.    Dispense:  40 capsule    Refill:  0   Patient Instructions    For wheezing and cough - there was no concern on your chest xray. Ok to use albuterol 1-2 puffs at a time up to every 4-6 hours as needed. If you need that medicine sooner than 4 hours, or persistently need medicine more than 2-3 times per day, may need different treatment.  Start vancomycin oral every 6 hours for current colitis. Once I see the results from your stool culture, can change meds if needed. After speaking with gastroenterology, it may be best that you [facility], follow-up with me in 3 days. Sooner if worse.  Bronchospasm, Adult Bronchospasm is  a tightening of  the airways going into the lungs. During an episode, it may be harder to breathe. You may cough, and you may make a whistling sound when you breathe (wheeze). This condition often affects people with asthma. What are the causes? This condition is caused by swelling and irritation in the airways. It can be triggered by:  An infection (common).  Seasonal allergies.  An allergic reaction.  Exercise.  Irritants. These include pollution, cigarette smoke, strong odors, aerosol sprays, and paint fumes.  Weather changes. Winds increase molds and pollens in the air. Cold air may cause swelling.  Stress and emotional upset. What are the signs or symptoms? Symptoms of this condition include:  Wheezing. If the episode was triggered by an allergy, wheezing may start right away or hours later.  Nighttime coughing.  Frequent or severe coughing with a simple cold.  Chest tightness.  Shortness of breath.  Decreased ability to exercise. How is this diagnosed? This condition is usually diagnosed with a review of your medical history and a physical exam. Tests, such as lung function tests, are sometimes done to look for other conditions. The need for a chest X-Cardenas depends on where the wheezing occurs and whether it is the first time you have wheezed. How is this treated? This condition may be treated with:  Inhaled medicines. These open up the airways and help you breathe. They can be taken with an inhaler or a nebulizer device.  Corticosteroid medicines. These may be given for severe bronchospasm, usually when it is associated with asthma.  Avoiding triggers, such as irritants, infection, or allergies. Follow these instructions at home: Medicines   Take over-the-counter and prescription medicines only as told by your health care provider.  If you need to use an inhaler or nebulizer to take your medicine, ask your health care provider to explain how to use it correctly. If you were given a  spacer, always use it with your inhaler. Lifestyle   Reduce the number of triggers in your home. To do this:  Change your heating and air conditioning filter at least once a month.  Limit your use of fireplaces and wood stoves.  Do not smoke. Do not allow smoking in your home.  Avoid using perfumes and fragrances.  Get rid of pests, such as roaches and mice, and their droppings.  Remove any mold from your home.  Keep your house clean and dust free. Use unscented cleaning products.  Replace carpet with wood, tile, or vinyl flooring. Carpet can trap dander and dust.  Use allergy-proof pillows, mattress covers, and box spring covers.  Wash bed sheets and blankets every week in hot water. Dry them in a dryer.  Use blankets that are made of polyester or cotton.  Wash your hands often.  Do not allow pets in your bedroom.  Avoid breathing in cold air when you exercise. General instructions   Have a plan for seeking medical care. Know when to call your health care provider and local emergency services, and where to get emergency care.  Stay up to date on your immunizations.  When you have an episode of bronchospasm, stay calm. Try to relax and breathe more slowly.  If you have asthma, make sure you have an asthma action plan.  Keep all follow-up visits as told by your health care provider. This is important. Contact a health care provider if:  You have muscle aches.  You have chest pain.  The mucus that you cough up (sputum)  changes from clear or white to yellow, green, gray, or bloody.  You have a fever.  Your sputum gets thicker. Get help right away if:  Your wheezing and coughing get worse, even after you take your prescribed medicines.  It gets even harder to breathe.  You develop severe chest pain. Summary  Bronchospasm is a tightening of the airways going into the lungs.  During an episode of bronchospasm, you may have a harder time breathing. You may  cough and make a whistling sound when you breathe (wheeze).  Avoid exposure to triggers such as smoke, dust, mold, animal dander, and fragrances.  When you have an episode of bronchospasm, stay calm. Try to relax and breathe more slowly. This information is not intended to replace advice given to you by your health care provider. Make sure you discuss any questions you have with your health care provider. Document Released: 02/28/2003 Document Revised: 02/22/2016 Document Reviewed: 02/22/2016 Elsevier Interactive Patient Education  2017 [employer] Inc.  Colitis Colitis is inflammation of the colon. Colitis may last a short time (acute) or it may last a long time (chronic). What are the causes? This condition may be caused by:  Viruses.  Bacteria.  Reactions to medicine.  Certain autoimmune diseases, such as Crohn disease or ulcerative colitis. What are the signs or symptoms? Symptoms of this condition include:  Diarrhea.  Passing bloody or tarry stool.  Pain.  Fever.  Vomiting.  Tiredness (fatigue).  Weight loss.  Bloating.  Sudden increase in abdominal pain.  Having fewer bowel movements than usual. How is this diagnosed? This condition is diagnosed with a stool test or a blood test. You may also have other tests, including X-rays, a CT scan, or a colonoscopy. How is this treated? Treatment may include:  Resting the bowel. This involves not eating or drinking for a period of time.  Fluids that are given through an IV tube.  Medicine for pain and diarrhea.  Antibiotic medicines.  Cortisone medicines.  Surgery. Follow these instructions at home: Eating and drinking   Follow instructions from your health care provider about eating or drinking restrictions.  Drink enough fluid to keep your urine clear or pale yellow.  Work with a dietitian to determine which foods cause your condition to flare up.  Avoid foods that cause flare-ups.  Eat a  well-balanced diet. Medicines   Take over-the-counter and prescription medicines only as told by your health care provider.  If you were prescribed an antibiotic medicine, take it as told by your health care provider. Do not stop taking the antibiotic even if you start to feel better. General instructions   Keep all follow-up visits as told by your health care provider. This is important. Contact a health care provider if:  Your symptoms do not go away.  You develop new symptoms. Get help right away if:  You have a fever that does not go away with treatment.  You develop chills.  You have extreme weakness, fainting, or dehydration.  You have repeated vomiting.  You develop severe pain in your abdomen.  You pass bloody or tarry stool. This information is not intended to replace advice given to you by your health care provider. Make sure you discuss any questions you have with your health care provider. Document Released: 04/04/2004 Document Revised: 08/03/2015 Document Reviewed: 06/20/2014 Elsevier Interactive Patient Education  2017 [employer].    IF you received an x-Cardenas today, you will receive an invoice from [facility]. Please contact [facility]  Radiology at [phone] with questions or concerns regarding your invoice.   IF you received labwork today, you will receive an invoice from [facility]. Please contact [facility] at [phone] with questions or concerns regarding your invoice.   Our billing staff will not be able to assist you with questions regarding bills from these companies.  You will be contacted with the lab results as soon as they are available. The fastest way to get your results is to activate your My Chart account. Instructions are located on the last page of this paperwork. If you have not heard from [place] regarding the results in 2 weeks, please contact this office.     _0  I personally performed the services described in this  documentation, which was scribed in my presence. The recorded information has been reviewed and considered for accuracy and completeness, addended by me as needed, and agree with information above.  Signed,   Marissa Ray, MD [facility] at [facility].  07/15/16 10:27 PM

## 2016-07-15 NOTE — ED Triage Notes (Signed)
Pt reports generalized abdominal pain that has been going on for months; states she has colitis but came tonight because 'I just keep getting sicker and sicker." She reports nausea and diarrhea.

## 2016-07-16 ENCOUNTER — Emergency Department (HOSPITAL_COMMUNITY)
Admission: EM | Admit: 2016-07-16 | Discharge: 2016-07-16 | Disposition: A | Discharge status: Home / Self Care | Payer: Medicare Other | Attending: Emergency Medicine | Admitting: Emergency Medicine

## 2016-07-16 NOTE — Telephone Encounter (Signed)
Patient has just been admitted to the ED at [facility]

## 2016-07-17 DIAGNOSIS — G8929 Other chronic pain: Secondary | ICD-10-CM | POA: Diagnosis not present

## 2016-07-17 DIAGNOSIS — M542 Cervicalgia: Secondary | ICD-10-CM | POA: Diagnosis not present

## 2016-07-17 DIAGNOSIS — M545 Low back pain: Secondary | ICD-10-CM | POA: Diagnosis not present

## 2016-07-17 DIAGNOSIS — M546 Pain in thoracic spine: Secondary | ICD-10-CM | POA: Diagnosis not present

## 2016-07-17 LAB — GI PROFILE, STOOL, PCR

## 2016-07-19 ENCOUNTER — Ambulatory Visit (INDEPENDENT_AMBULATORY_CARE_PROVIDER_SITE_OTHER): Payer: Medicare Other | Admitting: Family Medicine

## 2016-07-19 ENCOUNTER — Encounter: Payer: Self-pay | Admitting: Family Medicine

## 2016-07-19 VITALS — BP 148/100 | HR 96 | Temp 98.6°F | Resp 16 | Ht 64.0 in | Wt 111.4 lb

## 2016-07-19 DIAGNOSIS — R062 Wheezing: Secondary | ICD-10-CM | POA: Diagnosis not present

## 2016-07-19 DIAGNOSIS — I10 Essential (primary) hypertension: Secondary | ICD-10-CM

## 2016-07-19 DIAGNOSIS — M542 Cervicalgia: Secondary | ICD-10-CM | POA: Diagnosis not present

## 2016-07-19 DIAGNOSIS — M436 Torticollis: Secondary | ICD-10-CM

## 2016-07-19 DIAGNOSIS — I639 Cerebral infarction, unspecified: Secondary | ICD-10-CM | POA: Diagnosis not present

## 2016-07-19 DIAGNOSIS — R109 Unspecified abdominal pain: Secondary | ICD-10-CM | POA: Diagnosis not present

## 2016-07-19 DIAGNOSIS — M62838 Other muscle spasm: Secondary | ICD-10-CM

## 2016-07-19 DIAGNOSIS — R6883 Chills (without fever): Secondary | ICD-10-CM | POA: Diagnosis not present

## 2016-07-19 DIAGNOSIS — A0472 Enterocolitis due to Clostridium difficile, not specified as recurrent: Secondary | ICD-10-CM | POA: Diagnosis not present

## 2016-07-19 DIAGNOSIS — M549 Dorsalgia, unspecified: Secondary | ICD-10-CM | POA: Diagnosis not present

## 2016-07-19 LAB — POCT CBC
Granulocyte percent: 64.6 %G (ref 37–80)
HCT, POC: 41.1 % (ref 37.7–47.9)
Hemoglobin: 14.1 g/dL (ref 12.2–16.2)
Lymph, poc: 3.3 (ref 0.6–3.4)
MCH, POC: 32.3 pg — AB (ref 27–31.2)
MCHC: 34.3 g/dL (ref 31.8–35.4)
MCV: 94.2 fL (ref 80–97)
MID (cbc): 0.2 (ref 0–0.9)
MPV: 7.2 fL (ref 0–99.8)
POC Granulocyte: 6.5 (ref 2–6.9)
POC LYMPH PERCENT: 33 %L (ref 10–50)
POC MID %: 2.4 %M (ref 0–12)
Platelet Count, POC: 372 10*3/uL (ref 142–424)
RBC: 4.36 M/uL (ref 4.04–5.48)
RDW, POC: 15.4 %
WBC: 10 10*3/uL (ref 4.6–10.2)

## 2016-07-19 MED ORDER — HYDROCODONE-ACETAMINOPHEN 5-325 MG PO TABS: 1.0000 | ORAL_TABLET | Freq: Four times a day (QID) | ORAL | 0 refills | Status: DC | PRN

## 2016-07-19 MED ORDER — CARISOPRODOL 350 MG PO TABS: 350.0000 mg | ORAL_TABLET | Freq: Three times a day (TID) | ORAL | 0 refills | Status: DC | PRN

## 2016-07-19 NOTE — Progress Notes (Addendum)
Subjective:  By signing my name below, I, [name], attest that this documentation has been prepared under the direction and in the presence of [name], MD. Electronically Signed: [name], [place]. 07/19/2016 , 10:07 AM .  Patient was seen in Room 1 .   Patient ID: [name], female    DOB: January 06, 1951, 66 y.o.   MRN: [id number] Chief Complaint  Patient presents with  . Follow-up    Colitis, "diarrhea is better, still having still having stomach pain"   HPI [name] is a 66 y.o. female Here for follow up of abdominal pain. She has been having abdominal pain since hospitalization along with diarrhea, see details last visit. She had a positive C. Diff test on stool. She was started on vancomycin 125mg  oral QID on May 7th. I did speak to patient that night as she had worsening abdominal pain and chills, and advised to be seen in the ER. This is her first follow up since.   Abdominal pain She started her vancomycin 2 days ago as her pharmacy had to order it. Today is day 3 of taking it and taking it 4 times a day. She denies complications or side effects with it. She reports improved diarrhea down to once yesterday and once today, from 7-8 times a day previously. She states her abdominal pain is still about the same, and has been taking 2 tylenol every 4 hours for it. She still has chills, describes about the same amount. She denies fever.   She mentions waiting at the ER for a few hours, but she couldn't wait anymore and left as they informed her that she still had 50-70 patients in front of her so she went home.   Wheezing She had history of wheezing at last visit, and denies any improvement. She is quitting smoking.   Elevated BP She is taking carvedilol and denies missing any doses.   History of GI bleed Her CT scan done recently on May 5th indicated colitis, but also wall thickening of the distal stomach and duodenum, suggested of PUD without ulceration or  perforation. Discussed with the GI on the 7th and plan for her to follow up with GI at [facility], then transition to [facility] once acute symptoms improved.   She has an appointment with [facility] this coming Tuesday (in 4 days).   Orthopedics referral She was referred to ortho for neck and back pain at previous visit and started soma for muscle spasm at last visit and suspected Torticollis.   She's almost out of Soma, but still taking it 3 a day. She reports seeing ortho a few days ago, being informed that she might need surgery of her neck as she has C5-6-7 lock down, and going down her spine. She is scheduled for an MRI next week, and then being referred to neurologist to make sure her meningitis is gone before surgery. She denies being put on new or any changes in her medications. Her neck still feels stiff, but she's tried exercising it.   Patient Active Problem List   Diagnosis Date Noted  . Acute encephalopathy   . Fever   . Left hemiparesis ([place])   . Generalized OA   . Fibromyalgia   . Tobacco abuse   . Substance abuse   . Benign essential HTN   . Tachycardia   . Chronic pain syndrome   . Acute blood loss anemia   . Leukocytosis   . Septic thrombophlebitis of sagittal sinus   .  Acute respiratory failure ([place])   . Streptococcus pneumoniae meningitis   . Streptococcal bacteremia   . Meningitis   . Cerebral embolism with cerebral infarction 04/12/2016  . Stroke (cerebrum) ([place]) 04/12/2016  . Neck pain 04/04/2016  . Chronic abdominal pain 02/19/2016  . Essential hypertension 02/19/2016  . Bacterial conjunctivitis 02/19/2016  . Health care maintenance 02/19/2016  . ANXIETY 05/08/2006  . TOBACCO DEPENDENCE 05/08/2006  . ASTHMA, UNSPECIFIED 05/08/2006  . GASTROESOPHAGEAL REFLUX, NO ESOPHAGITIS 05/08/2006  . CONSTIPATION 05/08/2006  . CONVULSIONS, SEIZURES, NOS 05/08/2006   Past Medical History:  Diagnosis Date  . Arrhythmia   . Arthritis   . Fibromyalgia   . GERD  (gastroesophageal reflux disease)   . H/O: substance abuse 2005   narcotic usage due to chronic back pain  . Heart murmur   . Hypertension   . PONV (postoperative nausea and vomiting)    Past Surgical History:  Procedure Laterality Date  . ABDOMINAL HYSTERECTOMY    . ABDOMINAL SURGERY    . APPENDECTOMY    . BREAST SURGERY    . CHOLECYSTECTOMY N/A 09/30/2012   Procedure: LAPAROSCOPIC CHOLECYSTECTOMY WITH INTRAOPERATIVE CHOLANGIOGRAM;  Surgeon: Imogene Burn. Georgette Dover, MD;  Location: [facility];  Service: General;  Laterality: N/A;  . FRACTURE SURGERY     right upper arm  . OVARIAN CYST SURGERY     Allergies  Allergen Reactions  . Codeine Nausea And Vomiting  . Nsaids Other (See Comments)    Stomach ulcers   Prior to Admission medications   Medication Sig Start Date End Date Taking? Authorizing Provider  albuterol (PROVENTIL HFA;VENTOLIN HFA) 108 (90 Base) MCG/ACT inhaler Inhale 1-2 puffs into the lungs every 4 (four) hours as needed for wheezing or shortness of breath. 07/15/16   [name], MD  carisoprodol (SOMA) 350 MG tablet Take 1 tablet (350 mg total) by mouth 3 (three) times daily as needed for muscle spasms. 07/12/16   [name], MD  carvedilol (COREG) 3.125 MG tablet Take by mouth. 05/28/16 05/28/17  [provider]  pantoprazole (PROTONIX) 40 MG tablet Take 1 tablet (40 mg total) by mouth at bedtime. 04/22/16 05/22/16  [name], MD  potassium chloride (K-DUR,KLOR-CON) 10 MEQ tablet Take by mouth. 05/28/16 05/28/17  [provider]  pregabalin (LYRICA) 50 MG capsule Take by mouth. 06/06/16 06/06/17  [provider]  vancomycin (VANCOCIN) 125 MG capsule Take 1 capsule (125 mg total) by mouth 4 (four) times daily. 07/15/16   [name], MD   Social History   Social History  . Marital status: Married    Spouse name: N/A  . Number of children: N/A  . Years of education: N/A   Occupational History  . Not on file.   Social History Main  Topics  . Smoking status: Current Every Day Smoker    Packs/day: 1.00    Years: 30.00    Types: Cigarettes  . Smokeless tobacco: Never Used  . Alcohol use No  . Drug use: No     Comment: Methadone for narcotic use  . Sexual activity: Not on file   Other Topics Concern  . Not on file   Social History Narrative  . No narrative on file   Review of Systems  Constitutional: Positive for chills. Negative for fatigue and unexpected weight change.  Respiratory: Negative for chest tightness and shortness of breath.   Cardiovascular: Negative for chest pain, palpitations and leg swelling.  Gastrointestinal: Positive for abdominal pain and diarrhea. Negative for  blood in stool.  Neurological: Negative for dizziness, syncope, light-headedness and headaches.       Objective:   Physical Exam  Constitutional: She is oriented to person, place, and time. She appears well-developed and well-nourished.  HENT:  Head: Normocephalic and atraumatic.  Eyes: Conjunctivae and EOM are normal. Pupils are equal, round, and reactive to light.  Neck: Carotid bruit is not present.  Cardiovascular: Normal rate, regular rhythm, normal heart sounds and intact distal pulses.   Pulmonary/Chest: Effort normal. She has wheezes (faint expiratory).  Abdominal: Soft. She exhibits no distension and no pulsatile midline mass. Bowel sounds are increased. There is tenderness in the right upper quadrant and right lower quadrant.  Flat abdomen  Neurological: She is alert and oriented to person, place, and time.  Skin: Skin is warm and dry.  Psychiatric: She has a normal mood and affect. Her behavior is normal.  Vitals reviewed.   Vitals:   07/19/16 0908 07/19/16 0911 07/19/16 1014  BP: (!) 169/94 (!) 142/100 (!) 148/100  Pulse: 96    Resp: 16    Temp: 98.6 F (37 C)    TempSrc: Oral    SpO2: 99%    Weight: 111 lb 6.4 oz (50.5 kg)    Height: 5\' 4"  (1.626 m)     Results for orders placed or performed in visit  on 07/19/16  POCT CBC  Result Value Ref Range   WBC 10.0 4.6 - 10.2 K/uL   Lymph, poc 3.3 0.6 - 3.4   POC LYMPH PERCENT 33.0 10 - 50 %L   MID (cbc) 0.2 0 - 0.9   POC MID % 2.4 0 - 12 %M   POC Granulocyte 6.5 2 - 6.9   Granulocyte percent 64.6 37 - 80 %G   RBC 4.36 4.04 - 5.48 M/uL   Hemoglobin 14.1 12.2 - 16.2 g/dL   HCT, POC 41.1 37.7 - 47.9 %   MCV 94.2 80 - 97 fL   MCH, POC 32.3 (A) 27 - 31.2 pg   MCHC 34.3 31.8 - 35.4 g/dL   RDW, POC 15.4 %   Platelet Count, POC 372 142 - 424 K/uL   MPV 7.2 0 - 99.8 fL       Assessment & Plan:   [name] is a 66 y.o. female Wheezing  - Stable, mild. At your office needed, RTC precautions if persistent or frequent need  Neck pain - Plan: carisoprodol (SOMA) 350 MG tablet Torticollis - Plan: carisoprodol (SOMA) 350 MG tablet Muscle spasm - Plan: carisoprodol (SOMA) 350 MG tablet Back pain, unspecified back location, unspecified back pain laterality, unspecified chronicity - Plan: carisoprodol (SOMA) 350 MG tablet  -Now followed by orthopedics. Refilled Soma, plan for further imaging and possible surgery. RTC/ER precautions if worsening.  C. difficile colitis - Plan: POCT CBC, HYDROcodone-acetaminophen (NORCO/VICODIN) 5-325 MG tablet, DISCONTINUED: HYDROcodone-acetaminophen (NORCO/VICODIN) 5-325 MG tablet Chills - Plan: POCT CBC  -Improving CBC, symptoms improving. Low-dose hydrocodone if needed for abdominal pain, but combined side effects and risks with Soma were discussed. Plan for short course only.  Essential hypertension  - Uncontrolled. Increase Coreg to 2 pills twice a day. Potential side effects discussed, decreased back to 1 pill twice per day if dropping too low/orthostatic.  Abdominal pain, unspecified abdominal location - Plan: HYDROcodone-acetaminophen (NORCO/VICODIN) 5-325 MG tablet, DISCONTINUED: HYDROcodone-acetaminophen (NORCO/VICODIN) 5-325 MG tablet  -As above. Continue vancomycin, RTC precautions if not continuing to  improve, continue follow-up with GI as planned.  Meds ordered this encounter  Medications  . carisoprodol (SOMA) 350 MG tablet    Sig: Take 1 tablet (350 mg total) by mouth 3 (three) times daily as needed for muscle spasms.    Dispense:  30 tablet    Refill:  0  . DISCONTD: HYDROcodone-acetaminophen (NORCO/VICODIN) 5-325 MG tablet    Sig: Take 1 tablet by mouth every 6 (six) hours as needed for moderate pain.    Dispense:  20 tablet    Refill:  0  . HYDROcodone-acetaminophen (NORCO/VICODIN) 5-325 MG tablet    Sig: Take 1 tablet by mouth every 6 (six) hours as needed for moderate pain.    Dispense:  20 tablet    Refill:  0    Hydrocodone repeat rx due to printer not working. Patient Instructions   Keep follow up with ortho and gastroenterology.   Blood pressure elevated today, increase carvedilol to 2 pills twice per day for now. Monitor your blood pressure at home and keep a record for follow-up visit in 1 week., return to one pill twice per day if any lightheadedness or dizziness at that dose.  Infection fighting cells are improved today. Continue vancomycin for Clostridium difficile colitis. See information below. For pain, I did write a few hydrocodone that can be taken 1/2 -1 pill up to every 6 hours as needed for pain. Do not combine these with Tylenol. Would also caution on combining these with other sedating medicines including the Soma.  Recheck in 1 week, sooner if worse.   Albuterol if needed for wheezing, but if any increased need for albuterol or worsening wheezing, return sooner.  IF you received an x-ray today, you will receive an invoice from [facility]. Please contact [facility] Rad[facility] at [phone] with questions or concerns regarding your invoice.   IF you received labwork today, you will receive an invoice from [facility]. Please contact [facility] at [phone] with questions or concerns regarding your invoice.   Our billing staff will not be able  to assist you with questions regarding bills from these companies.  You will be contacted with the lab results as soon as they are available. The fastest way to get your results is to activate your My Chart account. Instructions are located on the last page of this paperwork. If you have not heard from [place] regarding the results in 2 weeks, please contact this office.      I personally performed the services described in this documentation, which was scribed in my presence. The recorded information has been reviewed and considered for accuracy and completeness, addended by me as needed, and agree with information above.  Signed,   [name], MD [facility].  07/19/16 11:12 AM

## 2016-07-19 NOTE — Patient Instructions (Addendum)
Keep follow up with ortho and gastroenterology.   Blood pressure elevated today, increase carvedilol to 2 pills twice per day for now. Monitor your blood pressure at home and keep a record for follow-up visit in 1 week., return to one pill twice per day if any lightheadedness or dizziness at that dose.  Infection fighting cells are improved today. Continue vancomycin for Clostridium difficile colitis. See information below. For pain, I did write a few hydrocodone that can be taken 1/2 -1 pill up to every 6 hours as needed for pain. Do not combine these with Tylenol. Would also caution on combining these with other sedating medicines including the Soma.  Recheck in 1 week, sooner if worse.   Albuterol if needed for wheezing, but if any increased need for albuterol or worsening wheezing, return sooner.  IF you received an x-ray today, you will receive an invoice from [facility]. Please contact [facility]iology at [phone] with questions or concerns regarding your invoice.   IF you received labwork today, you will receive an invoice from [facility]. Please contact [facility] at [phone] with questions or concerns regarding your invoice.   Our billing staff will not be able to assist you with questions regarding bills from these companies.  You will be contacted with the lab results as soon as they are available. The fastest way to get your results is to activate your My Chart account. Instructions are located on the last page of this paperwork. If you have not heard from [place] regarding the results in 2 weeks, please contact this office.

## 2016-07-23 ENCOUNTER — Ambulatory Visit: Payer: Medicare Other | Admitting: Physician Assistant

## 2016-08-02 ENCOUNTER — Other Ambulatory Visit: Payer: Self-pay

## 2016-08-04 ENCOUNTER — Emergency Department (HOSPITAL_COMMUNITY)
Admission: EM | Admit: 2016-08-04 | Discharge: 2016-08-04 | Discharge status: Against Medical Advice | Payer: Medicare Other | Attending: Dermatology | Admitting: Dermatology

## 2016-08-04 ENCOUNTER — Encounter (HOSPITAL_COMMUNITY): Payer: Self-pay | Admitting: Emergency Medicine

## 2016-08-04 DIAGNOSIS — R1011 Right upper quadrant pain: Secondary | ICD-10-CM | POA: Insufficient documentation

## 2016-08-04 DIAGNOSIS — R1031 Right lower quadrant pain: Secondary | ICD-10-CM | POA: Diagnosis not present

## 2016-08-04 LAB — CBC
HCT: 41 % (ref 36.0–46.0)
Hemoglobin: 13.9 g/dL (ref 12.0–15.0)
MCH: 31.4 pg (ref 26.0–34.0)
MCHC: 33.9 g/dL (ref 30.0–36.0)
MCV: 92.8 fL (ref 78.0–100.0)
Platelets: 423 10*3/uL — ABNORMAL HIGH (ref 150–400)
RBC: 4.42 MIL/uL (ref 3.87–5.11)
RDW: 14.6 % (ref 11.5–15.5)
WBC: 9.8 10*3/uL (ref 4.0–10.5)

## 2016-08-04 LAB — COMPREHENSIVE METABOLIC PANEL
ALT: 11 U/L — ABNORMAL LOW (ref 14–54)
AST: 15 U/L (ref 15–41)
Albumin: 4.1 g/dL (ref 3.5–5.0)
Alkaline Phosphatase: 103 U/L (ref 38–126)
Anion gap: 6 (ref 5–15)
BUN: 14 mg/dL (ref 6–20)
CO2: 28 mmol/L (ref 22–32)
Calcium: 9.7 mg/dL (ref 8.9–10.3)
Chloride: 109 mmol/L (ref 101–111)
Creatinine, Ser: 0.74 mg/dL (ref 0.44–1.00)
GFR calc Af Amer: >60 mL/min (ref >60)
GFR calc non Af Amer: >60 mL/min (ref >60)
Glucose, Bld: 108 mg/dL — ABNORMAL HIGH (ref 65–99)
Potassium: 4.6 mmol/L (ref 3.5–5.1)
Sodium: 143 mmol/L (ref 135–145)
Total Bilirubin: 0.4 mg/dL (ref 0.3–1.2)
Total Protein: 7.1 g/dL (ref 6.5–8.1)

## 2016-08-04 LAB — LIPASE, BLOOD: Lipase: 35 U/L (ref 11–51)

## 2016-08-04 NOTE — ED Notes (Signed)
Called for patient to go to rm 21, no response.

## 2016-08-04 NOTE — ED Triage Notes (Signed)
Pt c/o RUQ and RLQ abdominal pain for several weeks r/t recent dx of colitis. Pt states she has continued to have pain x several weeks, she was rx'd Vancomycin x 10 days and completed Vancomycin 7 days ago. Pt states she also continues with diarrhea. Pt c/o productive cough and mild shortness of breath.

## 2016-08-04 NOTE — ED Notes (Signed)
Pt called three times for room placement.

## 2016-08-06 ENCOUNTER — Encounter: Payer: Self-pay | Admitting: Cardiology

## 2016-08-06 ENCOUNTER — Ambulatory Visit: Payer: Medicare Other | Admitting: Neurology

## 2016-08-06 ENCOUNTER — Telehealth: Payer: Self-pay

## 2016-08-06 NOTE — Progress Notes (Deleted)
Cardiology Office Note  NEW PATIENT VISIT   Date:  08/06/2016   ID:  Artemis Koller, DOB Oct 30, 1950, MRN [id number]  PCP:  [name], MD  Cardiologist:  ***    No chief complaint on file.     History of Present Illness: [name] is a 66 y.o. female who presents for palpitations.    Hx of cdiff on vanc.  HTN, recently uncontrolled on coreg.   Hx of TEE 04/2016 with EF 60-65%, mild LVH, mild MR, no vegetation or thrombus.   Past Medical History:  Diagnosis Date  . Arrhythmia   . Arthritis   . Fibromyalgia   . GERD (gastroesophageal reflux disease)   . H/O: substance abuse 2005   narcotic usage due to chronic back pain  . Heart murmur   . Hypertension   . PONV (postoperative nausea and vomiting)     Past Surgical History:  Procedure Laterality Date  . ABDOMINAL HYSTERECTOMY    . ABDOMINAL SURGERY    . APPENDECTOMY    . BREAST SURGERY    . CHOLECYSTECTOMY N/A 09/30/2012   Procedure: LAPAROSCOPIC CHOLECYSTECTOMY WITH INTRAOPERATIVE CHOLANGIOGRAM;  Surgeon: Imogene Burn. Georgette Dover, MD;  Location: [facility];  Service: General;  Laterality: N/A;  . FRACTURE SURGERY     right upper arm  . OVARIAN CYST SURGERY       Current Outpatient Prescriptions  Medication Sig Dispense Refill  . albuterol (PROVENTIL HFA;VENTOLIN HFA) 108 (90 Base) MCG/ACT inhaler Inhale 1-2 puffs into the lungs every 4 (four) hours as needed for wheezing or shortness of breath. 1 Inhaler 0  . carisoprodol (SOMA) 350 MG tablet Take 1 tablet (350 mg total) by mouth 3 (three) times daily as needed for muscle spasms. 30 tablet 0  . carvedilol (COREG) 3.125 MG tablet Take by mouth.    Marland Kitchen HYDROcodone-acetaminophen (NORCO/VICODIN) 5-325 MG tablet Take 1 tablet by mouth every 6 (six) hours as needed for moderate pain. 20 tablet 0  . pantoprazole (PROTONIX) 40 MG tablet Take 1 tablet (40 mg total) by mouth at bedtime. 30 tablet 2  . potassium chloride (K-DUR,KLOR-CON) 10 MEQ tablet Take by mouth.    .  pregabalin (LYRICA) 50 MG capsule Take by mouth.    . vancomycin (VANCOCIN) 125 MG capsule Take 1 capsule (125 mg total) by mouth 4 (four) times daily. 40 capsule 0   No current facility-administered medications for this visit.     Allergies:   Codeine and Nsaids    Social History:  The patient  reports that she has been smoking Cigarettes.  She has a 30.00 pack-year smoking history. She has never used smokeless tobacco. She reports that she does not drink alcohol or use drugs.   Family History:  The patient's ***family history includes Alzheimer's disease in her mother; Cancer in her father; Heart disease in her father.    ROS:  General:no colds or fevers, no weight changes Skin:no rashes or ulcers HEENT:no blurred vision, no congestion CV:see HPI PUL:see HPI GI:no diarrhea constipation or melena, no indigestion GU:no hematuria, no dysuria MS:no joint pain, no claudication Neuro:no syncope, no lightheadedness Endo:no diabetes, no thyroid disease Wt Readings from Last 3 Encounters:  08/04/16 111 lb 1 oz (50.4 kg)  07/19/16 111 lb 6.4 oz (50.5 kg)  07/15/16 114 lb (51.7 kg)     PHYSICAL EXAM: VS:  There were no vitals taken for this visit. , BMI There is no height or weight on file to calculate BMI. General:Pleasant affect, NAD  Skin:Warm and dry, brisk capillary refill HEENT:normocephalic, sclera clear, mucus membranes moist Neck:supple, no JVD, no bruits  Heart:S1S2 RRR without murmur, gallup, rub or click Lungs:clear without rales, rhonchi, or wheezes ORV:IFBP, non tender, + BS, do not palpate liver spleen or masses Ext:no lower ext edema, 2+ pedal pulses, 2+ radial pulses Neuro:alert and oriented, MAE, follows commands, + facial symmetry    EKG:  EKG is ordered today. The ekg ordered today demonstrates ***   Recent Labs: 04/21/2016: Magnesium 1.7 08/04/2016: ALT 11; BUN 14; Creatinine, Ser 0.74; Hemoglobin 13.9; Platelets 423; Potassium 4.6; Sodium 143    Lipid  Panel    Component Value Date/Time   CHOL 65 04/13/2016 0234   TRIG 94 04/13/2016 0234   HDL 30 (L) 04/13/2016 0234   CHOLHDL 2.2 04/13/2016 0234   VLDL 19 04/13/2016 0234   LDLCALC 16 04/13/2016 0234       Other studies Reviewed: Additional studies/ records that were reviewed today include: ***.   ASSESSMENT AND PLAN:  1.  ***   Current medicines are reviewed with the patient today.  The patient Has no concerns regarding medicines.  The following changes have been made:  See above Labs/ tests ordered today include:see above  Disposition:   FU:  see above  Signed, Cecilie Kicks, NP  08/06/2016 9:54 PM    [facility] [place], [place], [place], [place] Phone: [phone]; Fax: [phone]

## 2016-08-06 NOTE — Telephone Encounter (Signed)
Pt no-showed her new pt appt.

## 2016-08-06 NOTE — Telephone Encounter (Signed)
This encounter was created in error - please disregard.

## 2016-08-07 ENCOUNTER — Ambulatory Visit: Payer: Medicare Other | Admitting: Cardiology

## 2016-08-08 ENCOUNTER — Encounter: Payer: Self-pay | Admitting: Neurology

## 2016-08-08 ENCOUNTER — Encounter: Payer: Self-pay | Admitting: Family Medicine

## 2016-08-08 ENCOUNTER — Ambulatory Visit (INDEPENDENT_AMBULATORY_CARE_PROVIDER_SITE_OTHER): Payer: Medicare Other | Admitting: Family Medicine

## 2016-08-08 ENCOUNTER — Ambulatory Visit (INDEPENDENT_AMBULATORY_CARE_PROVIDER_SITE_OTHER): Payer: Medicare Other

## 2016-08-08 ENCOUNTER — Encounter: Payer: Self-pay | Admitting: Cardiology

## 2016-08-08 ENCOUNTER — Other Ambulatory Visit: Payer: Self-pay | Admitting: Family Medicine

## 2016-08-08 VITALS — BP 178/90 | HR 115 | Temp 97.9°F | Resp 16 | Ht 64.0 in | Wt 109.4 lb

## 2016-08-08 DIAGNOSIS — R062 Wheezing: Secondary | ICD-10-CM

## 2016-08-08 DIAGNOSIS — R1084 Generalized abdominal pain: Secondary | ICD-10-CM

## 2016-08-08 DIAGNOSIS — R197 Diarrhea, unspecified: Secondary | ICD-10-CM | POA: Diagnosis not present

## 2016-08-08 DIAGNOSIS — Z87891 Personal history of nicotine dependence: Secondary | ICD-10-CM | POA: Diagnosis not present

## 2016-08-08 DIAGNOSIS — R0600 Dyspnea, unspecified: Secondary | ICD-10-CM | POA: Diagnosis not present

## 2016-08-08 DIAGNOSIS — R000 Tachycardia, unspecified: Secondary | ICD-10-CM

## 2016-08-08 DIAGNOSIS — R109 Unspecified abdominal pain: Secondary | ICD-10-CM

## 2016-08-08 DIAGNOSIS — I10 Essential (primary) hypertension: Secondary | ICD-10-CM | POA: Diagnosis not present

## 2016-08-08 DIAGNOSIS — R6883 Chills (without fever): Secondary | ICD-10-CM

## 2016-08-08 DIAGNOSIS — A0472 Enterocolitis due to Clostridium difficile, not specified as recurrent: Secondary | ICD-10-CM | POA: Diagnosis not present

## 2016-08-08 DIAGNOSIS — L659 Nonscarring hair loss, unspecified: Secondary | ICD-10-CM

## 2016-08-08 DIAGNOSIS — M62838 Other muscle spasm: Secondary | ICD-10-CM

## 2016-08-08 DIAGNOSIS — M436 Torticollis: Secondary | ICD-10-CM

## 2016-08-08 DIAGNOSIS — M549 Dorsalgia, unspecified: Secondary | ICD-10-CM

## 2016-08-08 DIAGNOSIS — Z8719 Personal history of other diseases of the digestive system: Secondary | ICD-10-CM | POA: Diagnosis not present

## 2016-08-08 DIAGNOSIS — M542 Cervicalgia: Secondary | ICD-10-CM

## 2016-08-08 DIAGNOSIS — R05 Cough: Secondary | ICD-10-CM | POA: Diagnosis not present

## 2016-08-08 DIAGNOSIS — I639 Cerebral infarction, unspecified: Secondary | ICD-10-CM

## 2016-08-08 LAB — POCT CBC
Granulocyte percent: 70.8 % (ref 37–80)
HCT, POC: 43.2 % (ref 37.7–47.9)
Hemoglobin: 15.4 g/dL (ref 12.2–16.2)
Lymph, poc: 1.7 (ref 0.6–3.4)
MCH, POC: 32.1 pg — AB (ref 27–31.2)
MCHC: 35.7 g/dL — AB (ref 31.8–35.4)
MCV: 89.9 fL (ref 80–97)
MID (cbc): 1.2 — AB (ref 0–0.9)
MPV: 7.1 fL (ref 0–99.8)
POC Granulocyte: 7.2 — AB (ref 2–6.9)
POC LYMPH PERCENT: 17 % (ref 10–50)
POC MID %: 12.2 % — AB (ref 0–12)
Platelet Count, POC: 411 K/uL (ref 142–424)
RBC: 4.8 M/uL (ref 4.04–5.48)
RDW, POC: 15 %
WBC: 10.1 K/uL (ref 4.6–10.2)

## 2016-08-08 MED ORDER — PANTOPRAZOLE SODIUM 40 MG PO TBEC: 40.0000 mg | DELAYED_RELEASE_TABLET | Freq: Every day | ORAL | 2 refills | Status: DC

## 2016-08-08 MED ORDER — VANCOMYCIN HCL 125 MG PO CAPS: 125.0000 mg | ORAL_CAPSULE | Freq: Four times a day (QID) | ORAL | 0 refills | Status: DC

## 2016-08-08 MED ORDER — HYDROCODONE-ACETAMINOPHEN 5-325 MG PO TABS: 1.0000 | ORAL_TABLET | Freq: Four times a day (QID) | ORAL | 0 refills | Status: DC | PRN

## 2016-08-08 MED ORDER — CARVEDILOL 6.25 MG PO TABS: 6.2500 mg | ORAL_TABLET | Freq: Two times a day (BID) | ORAL | 3 refills | Status: DC

## 2016-08-08 MED ORDER — BECLOMETHASONE DIPROPIONATE 40 MCG/ACT IN AERS: 2.0000 | INHALATION_SPRAY | Freq: Two times a day (BID) | RESPIRATORY_TRACT | 3 refills | Status: DC

## 2016-08-08 NOTE — Progress Notes (Addendum)
By signing my name below, I, [name], attest that this documentation has been prepared under the direction and in the presence of Marissa Ray, MD.  Electronically Signed: [name], Medical Scribe. 08/08/16. 12:20 PM.  Subjective:    Patient ID: [name], female    DOB: 1950/10/31, 66 y.o.   MRN: [id number]  HPI Chief Complaint  Patient presents with  . Colitis    states she is not doing good; severe pain 24/7  . Diarrhea    states diarrhea is due to colitis; returned after taking the meds  . Follow-up  . Medication Refill    Carvedilol; wants an higher dose for insurance purpose    HPI Comments: [name] is a 66 y.o. female who presents to Primary Care at [facility] for follow-up.   Colitis with abdominal pain and diarrhea: She had been on prolonged abx with previous meningitis with persistent diarreah since recent hospitalizations as well as abdominal pain. She had abdominal CT scan on May 5th indicating possible peptic ulcer disease with wall thickening of stomach and duodenum. She also had wall thickening of colon, suspected colitis. She had a positive C. Diff. testing on May 7th. She was treated with vancomycin 125 mg QID, started on May 9th. Diarrhea was improving at May 11th office visit to approx 1x per day, versus 8x per day previously. Her WBCs were 10.0 at that visit. She was taking tylenol. I did write for a few hydrocodone for break through pain until she could follow-up with planned GI visit, as she was planning to see [facility] 4 days later. Previously treated by [facility] when she had the GI bleed in March 2018. It appears she canceled her appt May 15th with GI. She did have some cough and wheeze and had CXR May 7th;  without acute findings. She did present to the ER May 27th. She did have some blood work initially drawn with nl lipsase, CMP at the time with glucose 108, WBC 9.8 on her CBC, Hgb was nl at 13.9. She did not been seen by a provider as  previously been seen.  Pt finished her vancomycin around the 19th. Reports she felt better on the 11th and the diarrhea stopped and the pain resolved. About 3 days after finishing abx her diarrhea returned with 3-4x a day, with associated sxs of constant right side to mid abdominal pain, nausea, chills, constant cold feet and legs, continued productive cough, hair loss, and brittle nails. Pt uses albuterol BID, and musinex and her cough has not worsened since last visit. Reports her hair has been falling out since her meningitis started, but it's been coming out "by the handful" in the past couple of weeks. Pt ran out of protonix in April. Pt has been eating and drinking fine. Pt quit smoking for 6 weeks now. Denies melena, bloody stool.  Pt hasn't had thyroid issues in the past. Pt's car broke down so she cancelled her appt. She just got her car fixed.  HTN: She has a history of stroke and meningitis. Complicated history, see recent visit. BP was uncontrolled at last visit so we increased her coreg to 3.125 mg BID Pt ran out of her bp medication for the past 2 days. She reports her bp was running high when she was on her medication and had occasional HAs while taking it. Reports feeling her heart in her gut for days and she's been having SOB for 3 days now. Denies chest pains, calf pain,  leg pain, and PMHx of blood clots.  Lab Results  Component Value Date   CREATININE 0.74 08/04/2016   Patient Active Problem List   Diagnosis Date Noted  . Acute encephalopathy   . Fever   . Left hemiparesis ([place])   . Generalized OA   . Fibromyalgia   . Tobacco abuse   . Substance abuse   . Benign essential HTN   . Tachycardia   . Chronic pain syndrome   . Acute blood loss anemia   . Leukocytosis   . Septic thrombophlebitis of sagittal sinus   . Acute respiratory failure ([place])   . Streptococcus pneumoniae meningitis   . Streptococcal bacteremia   . Meningitis   . Cerebral embolism with cerebral  infarction 04/12/2016  . Stroke (cerebrum) ([place]) 04/12/2016  . Neck pain 04/04/2016  . Chronic abdominal pain 02/19/2016  . Essential hypertension 02/19/2016  . Bacterial conjunctivitis 02/19/2016  . Health care maintenance 02/19/2016  . ANXIETY 05/08/2006  . TOBACCO DEPENDENCE 05/08/2006  . ASTHMA, UNSPECIFIED 05/08/2006  . GASTROESOPHAGEAL REFLUX, NO ESOPHAGITIS 05/08/2006  . CONSTIPATION 05/08/2006  . CONVULSIONS, SEIZURES, NOS 05/08/2006   Past Medical History:  Diagnosis Date  . Arrhythmia   . Arthritis   . Fibromyalgia   . GERD (gastroesophageal reflux disease)   . H/O: substance abuse 2005   narcotic usage due to chronic back pain  . Heart murmur   . Hypertension   . PONV (postoperative nausea and vomiting)    Past Surgical History:  Procedure Laterality Date  . ABDOMINAL HYSTERECTOMY    . ABDOMINAL SURGERY    . APPENDECTOMY    . BREAST SURGERY    . CHOLECYSTECTOMY N/A 09/30/2012   Procedure: LAPAROSCOPIC CHOLECYSTECTOMY WITH INTRAOPERATIVE CHOLANGIOGRAM;  Surgeon: Imogene Burn. Georgette Dover, MD;  Location: [facility] ORS;  Service: General;  Laterality: N/A;  . FRACTURE SURGERY     right upper arm  . OVARIAN CYST SURGERY     Allergies  Allergen Reactions  . Codeine Nausea And Vomiting  . Nsaids Other (See Comments)    Stomach ulcers   Prior to Admission medications   Medication Sig Start Date End Date Taking? Authorizing Provider  albuterol (PROVENTIL HFA;VENTOLIN HFA) 108 (90 Base) MCG/ACT inhaler Inhale 1-2 puffs into the lungs every 4 (four) hours as needed for wheezing or shortness of breath. 07/15/16  Yes [name], MD  carisoprodol (SOMA) 350 MG tablet Take 1 tablet (350 mg total) by mouth 3 (three) times daily as needed for muscle spasms. 07/19/16  Yes [name], MD  potassium chloride (K-DUR,KLOR-CON) 10 MEQ tablet Take by mouth. 05/28/16 05/28/17 Yes [provider]  pregabalin (LYRICA) 50 MG capsule Take by mouth. 06/06/16 06/06/17 Yes [provider]  carvedilol (COREG) 3.125 MG tablet Take by mouth. 05/28/16 05/28/17  [provider]  pantoprazole (PROTONIX) 40 MG tablet Take 1 tablet (40 mg total) by mouth at bedtime. 04/22/16 05/22/16  [name], MD   Social History   Social History  . Marital status: Married    Spouse name: N/A  . Number of children: N/A  . Years of education: N/A   Occupational History  . Not on file.   Social History Main Topics  . Smoking status: Current Every Day Smoker    Packs/day: 1.00    Years: 30.00    Types: Cigarettes  . Smokeless tobacco: Never Used  . Alcohol use No  . Drug use: No     Comment: Methadone for narcotic use  .  Sexual activity: Not on file   Other Topics Concern  . Not on file   Social History Narrative  . No narrative on file   Review of Systems  Constitutional: Positive for chills. Negative for appetite change.  Respiratory: Positive for cough and shortness of breath.   Cardiovascular: Positive for palpitations. Negative for chest pain and leg swelling.  Gastrointestinal: Positive for abdominal pain, diarrhea and nausea. Negative for blood in stool.  Endocrine: Positive for cold intolerance.  Musculoskeletal: Negative for myalgias.  Neurological: Positive for headaches.   Objective:  Physical Exam  Constitutional: She appears well-developed and well-nourished. No distress.  HENT:  Head: Normocephalic and atraumatic.  Eyes: Conjunctivae are normal.  Neck: Neck supple.  Cardiovascular: Regular rhythm and normal heart sounds.  Tachycardia present.  Exam reveals no gallop and no friction rub.   No murmur heard. Slightly tachycardic but regular rhythm  Pulmonary/Chest: Effort normal. No respiratory distress. She has wheezes (diffuse expiratory). She has no rales.  Speaking in full sentences  Abdominal: She exhibits no distension. There is tenderness in the right upper quadrant and right lower quadrant.  Abdomen flat and non distended    Neurological: She is alert.  Skin: Skin is warm and dry.  Psychiatric: She has a normal mood and affect. Her behavior is normal.  Nursing note and vitals reviewed.   Vitals:   08/08/16 1137  BP: (!) 178/90  Pulse: (!) 115  Resp: 16  Temp: 97.9 F (36.6 C)  TempSrc: Oral  SpO2: 97%  Weight: 109 lb 6.4 oz (49.6 kg)  Height: 5\' 4"  (1.626 m)  Body mass index is 18.78 kg/m.   EKG: sinus tachycardia, no apparent changes from prior.   Results for orders placed or performed in visit on 08/08/16  POCT CBC  Result Value Ref Range   WBC 10.1 4.6 - 10.2 K/uL   Lymph, poc 1.7 0.6 - 3.4   POC LYMPH PERCENT 17.0 10 - 50 %L   MID (cbc) 1.2 (A) 0 - 0.9   POC MID % 12.2 (A) 0 - 12 %M   POC Granulocyte 7.2 (A) 2 - 6.9   Granulocyte percent 70.8 37 - 80 %G   RBC 4.80 4.04 - 5.48 M/uL   Hemoglobin 15.4 12.2 - 16.2 g/dL   HCT, POC 43.2 37.7 - 47.9 %   MCV 89.9 80 - 97 fL   MCH, POC 32.1 (A) 27 - 31.2 pg   MCHC 35.7 (A) 31.8 - 35.4 g/dL   RDW, POC 15.0 %   Platelet Count, POC 411 142 - 424 K/uL   MPV 7.1 0 - 99.8 fL  Dg Chest 2 View  Result Date: 08/08/2016 CLINICAL DATA:  Cough.  Wheezing. EXAM: CHEST  2 VIEW COMPARISON:  07/15/2016. FINDINGS: Normal sized heart. The lungs remain hyperexpanded with mild diffuse peribronchial thickening and accentuation of the interstitial markings. Diffuse osteopenia. IMPRESSION: Stable changes of COPD and chronic bronchitis. No acute abnormality. Electronically Signed   By: Claudie Revering M.D.   On: 08/08/2016 13:16     Assessment & Plan:    Arianne Klinge is a 66 y.o. female C. difficile colitis - Plan: POCT CBC, Basic metabolic panel, vancomycin (VANCOCIN) 125 MG capsule, HYDROcodone-acetaminophen (NORCO/VICODIN) 5-325 MG tablet Generalized abdominal pain - Plan: POCT CBC Chills - Plan: POCT CBC Diarrhea of presumed infectious origin Tachycardia - Plan: EKG 74-[facility], Basic metabolic panel  - Suspected C. difficile recurrence. Based on review of  up-to-date, can try initial approach at  restarting vancomycin 125 mg 4 times a day for the next 2 weeks. Then plan on slow taper.  -Based on exam, CBC, and afebrile in office will treat outpatient initially. Close follow-up in 48 hours with Dr. Tamala Julian. RTC/ER precautions if worsening.  History of gastrointestinal bleeding - Plan: pantoprazole (PROTONIX) 40 MG tablet  - stressed importance of continuing PPI until advised by GI to d/c with prior GI bleed. Unfortunately PPI use with chronic abx may have contributed to C diff. CBC stable. Advised to call GI to reschedule appt.   Hair thinning - Plan: TSH  -check TSH. Stress component possible, consider derm eval.   Wheezing - Plan: beclomethasone (QVAR) 40 MCG/ACT inhaler, DG Chest 2 View History of tobacco abuse - Plan: beclomethasone (QVAR) 40 MCG/ACT inhaler Dyspnea, unspecified type  - Possible COPD. Persistent wheeze, but with GI bleed as above, will avoid oral prednisone at this time. Started a Qvar, that was not covered so changed to [employer] inhaler, see phone message. Continue albuterol as needed for breakthrough symptoms. RTC/ER precautions.  Essential hypertension - Plan: Basic metabolic panel  -Elevated off of medication. No new neurologic symptoms. Will restart carvedilol at 6.25mg  twice a day. Stressed importance of continuing her medication, ER precautions if new neurologic symptoms.  Over 40 minutes of care provided with face-to-face care and greater than 50% counseling.  Meds ordered this encounter  Medications  . pantoprazole (PROTONIX) 40 MG tablet    Sig: Take 1 tablet (40 mg total) by mouth at bedtime.    Dispense:  30 tablet    Refill:  2  . beclomethasone (QVAR) 40 MCG/ACT inhaler    Sig: Inhale 2 puffs into the lungs 2 (two) times daily.    Dispense:  1 Inhaler    Refill:  3  . carvedilol (COREG) 6.25 MG tablet    Sig: Take 1 tablet (6.25 mg total) by mouth 2 (two) times daily with a meal.    Dispense:  60 tablet     Refill:  3  . vancomycin (VANCOCIN) 125 MG capsule    Sig: Take 1 capsule (125 mg total) by mouth 4 (four) times daily.    Dispense:  56 capsule    Refill:  0  . HYDROcodone-acetaminophen (NORCO/VICODIN) 5-325 MG tablet    Sig: Take 1 tablet by mouth every 6 (six) hours as needed for moderate pain.    Dispense:  20 tablet    Refill:  0   Patient Instructions   Blood pressure elevated again today, but restart medication, then follow up within 1 week to recheck on medication.  Can decide on further changes then. If you have chest pains, new headaches, weakness, vision changes - call 911 or go to emergency room.   I will check a thyroid test for your hair loss, but that may need to be discussed with dermatology if testing is normal.    For colitis,Blood counts looked okay today, and stable from ER visit a few days ago. I will check a kidney function test, and as long as that is okay in your improving the next few days, we can try treating this outpatient. restart Vancomycin again 4 times per day for 2 weeks, then will taper to twice per day for 1 week, then once per day for 1 week, then every 2-3 days for anywhere from 2-8 weeks.  If you have any increased abdominal pain, fevers or worsening chills, in the next day or 2, proceed to the emergency room as  we discussed. Otherwise follow-up in 2 days for repeat exam and blood counts.  I provided the initial 2 weeks of medication to your pharmacy, but we will need to refill that medicine for taper after 2 weeks.  It is very important that you follow up with gastroenterology - please call her office today for a repeat appointment. I did restart your proton pump inhibitor to help protect against another gastrointestinal bleed.  Start Qvar for wheezing, albuterol up to every 4 hours if needed.  If more frequent use of albuterol needed or sooner then every 4 hours, proceed to the emergency room. If your shortness of breath is not improving or any chest  pains, proceed to the emergency room.  Return to the clinic or go to the nearest emergency room if any of your symptoms worsen or new symptoms occur.   Clostridium Difficile Infection Clostridium difficile (C. difficile or C. diff) infection is a condition that causes inflammation of the large intestine (colon). This condition can result in damage to the lining of your colon and may lead to colitis. This infection can be passed from person to person (is contagious). What are the causes? C. diff is a bacterium that is normally found in the colon. This infection is caused when the balance of C. diff is changed and there is an overgrowth of C. diff. This is often caused by antibiotic use. What increases the risk? This condition is more likely to develop in people who:  Take antibiotic medicines.  Take a certain type of medicine called proton pump inhibitors over a long period of time (chronic use).  Are older.  Have had a C. diff infection before.  Have serious underlying conditions, such as colon cancer.  Are in the hospital.  Have a weak defense (immune) system.  Live in a place where there is a lot of contact with others, such as a nursing home.  Have had gastrointestinal (GI) tract surgery.  What are the signs or symptoms? Symptoms of this condition include:  Diarrhea. This may be bloody, watery, or yellow or green in color.  Fever.  Fatigue.  Loss of appetite.  Nausea.  Swelling, pain, or tenderness in the abdomen.  Dehydration. Dehydration can cause you to be tired and thirsty, have a dry mouth, and urinate less frequently.  How is this diagnosed? This condition is diagnosed with a medical history and physical exam. You may also have tests, including:  A test that checks for C. diff in your stool.  Blood tests.  A sigmoidoscopy or colonoscopy to look at your colon. These procedures involve passing an instrument through your rectum to look at the inside of your  colon.  How is this treated? Treatment for this condition includes:  Antibiotics that keep C. diff from growing.  Stopping the antibiotics you were on before the C. diff infection began. Only do this as told by your health care provider.  Fluids through an IV tube, if you are dehydrated.  Surgery to remove the infected part of the colon. This is rare.  Follow these instructions at home: Eating and drinking  Drink enough fluid to keep your urine clear or pale yellow. Avoid milk, caffeine, and alcohol.  Follow specific rehydration instructions as told by your health care provider.  Eat small, frequent meals instead of large meals. Medicines  Take your antibiotic medicine as told by your health care provider. Do not stop taking the antibiotic even if you start to feel better unless your  health care provider told you to do that.  Take over-the-counter and prescription medicines only as told by your health care provider.  Do not use medicines to help with diarrhea. General instructions  Wash your hands thoroughly before you prepare food and after you use the bathroom. Make sure people who live with you also wash their hands often.  Clean surfaces that you touch with a product that contains chlorine bleach.  Keep all follow-up visits as told by your health care provider. This is important. Contact a health care provider if:  Your symptoms do not get better with treatment.  Your symptoms get worse with treatment.  Your symptoms go away and then return.  You have a fever.  You have new symptoms. Get help right away if:  You have increasing pain or tenderness in your abdomen.  You have stool that is mostly bloody, or your stool looks dark black and tarry.  You cannot eat or drink without vomiting.  You have signs of dehydration, such as: ? Dark urine, very little urine, or no urine. ? Cracked lips. ? Not making tears when you cry. ? Dry mouth. ? Sunken  eyes. ? Sleepiness. ? Weakness. ? Dizziness. This information is not intended to replace advice given to you by your health care provider. Make sure you discuss any questions you have with your health care provider. Document Released: 12/05/2004 Document Revised: 08/03/2015 Document Reviewed: 08/29/2014 Elsevier Interactive Patient Education  2017 [employer]  Bronchospasm, Adult Bronchospasm is a tightening of the airways going into the lungs. During an episode, it may be harder to breathe. You may cough, and you may make a whistling sound when you breathe (wheeze). This condition often affects people with asthma. What are the causes? This condition is caused by swelling and irritation in the airways. It can be triggered by:  An infection (common).  Seasonal allergies.  An allergic reaction.  Exercise.  Irritants. These include pollution, cigarette smoke, strong odors, aerosol sprays, and paint fumes.  Weather changes. Winds increase molds and pollens in the air. Cold air may cause swelling.  Stress and emotional upset.  What are the signs or symptoms? Symptoms of this condition include:  Wheezing. If the episode was triggered by an allergy, wheezing may start right away or hours later.  Nighttime coughing.  Frequent or severe coughing with a simple cold.  Chest tightness.  Shortness of breath.  Decreased ability to exercise.  How is this diagnosed? This condition is usually diagnosed with a review of your medical history and a physical exam. Tests, such as lung function tests, are sometimes done to look for other conditions. The need for a chest X-Cardenas depends on where the wheezing occurs and whether it is the first time you have wheezed. How is this treated? This condition may be treated with:  Inhaled medicines. These open up the airways and help you breathe. They can be taken with an inhaler or a nebulizer device.  Corticosteroid medicines. These may be given  for severe bronchospasm, usually when it is associated with asthma.  Avoiding triggers, such as irritants, infection, or allergies.  Follow these instructions at home: Medicines  Take over-the-counter and prescription medicines only as told by your health care provider.  If you need to use an inhaler or nebulizer to take your medicine, ask your health care provider to explain how to use it correctly. If you were given a spacer, always use it with your inhaler. Lifestyle  Reduce the number  of triggers in your home. To do this: ? Change your heating and air conditioning filter at least once a month. ? Limit your use of fireplaces and wood stoves. ? Do not smoke. Do not allow smoking in your home. ? Avoid using perfumes and fragrances. ? Get rid of pests, such as roaches and mice, and their droppings. ? Remove any mold from your home. ? Keep your house clean and dust free. Use unscented cleaning products. ? Replace carpet with wood, tile, or vinyl flooring. Carpet can trap dander and dust. ? Use allergy-proof pillows, mattress covers, and box spring covers. ? Wash bed sheets and blankets every week in hot water. Dry them in a dryer. ? Use blankets that are made of polyester or cotton. ? Wash your hands often. ? Do not allow pets in your bedroom.  Avoid breathing in cold air when you exercise. General instructions  Have a plan for seeking medical care. Know when to call your health care provider and local emergency services, and where to get emergency care.  Stay up to date on your immunizations.  When you have an episode of bronchospasm, stay calm. Try to relax and breathe more slowly.  If you have asthma, make sure you have an asthma action plan.  Keep all follow-up visits as told by your health care provider. This is important. Contact a health care provider if:  You have muscle aches.  You have chest pain.  The mucus that you cough up (sputum) changes from clear or white  to yellow, green, gray, or bloody.  You have a fever.  Your sputum gets thicker. Get help right away if:  Your wheezing and coughing get worse, even after you take your prescribed medicines.  It gets even harder to breathe.  You develop severe chest pain. Summary  Bronchospasm is a tightening of the airways going into the lungs.  During an episode of bronchospasm, you may have a harder time breathing. You may cough and make a whistling sound when you breathe (wheeze).  Avoid exposure to triggers such as smoke, dust, mold, animal dander, and fragrances.  When you have an episode of bronchospasm, stay calm. Try to relax and breathe more slowly. This information is not intended to replace advice given to you by your health care provider. Make sure you discuss any questions you have with your health care provider. Document Released: 02/28/2003 Document Revised: 02/22/2016 Document Reviewed: 02/22/2016 Elsevier Interactive Patient Education  2017 [employer].  IF you received an x-Cardenas today, you will receive an invoice from [facility]. Please contact [facility] at [phone] with questions or concerns regarding your invoice.   IF you received labwork today, you will receive an invoice from [facility]. Please contact [facility] at [phone] with questions or concerns regarding your invoice.   Our billing staff will not be able to assist you with questions regarding bills from these companies.  You will be contacted with the lab results as soon as they are available. The fastest way to get your results is to activate your My Chart account. Instructions are located on the last page of this paperwork. If you have not heard from [place] regarding the results in 2 weeks, please contact this office.       I personally performed the services described in this documentation, which was scribed in my presence. The recorded information has been reviewed and considered for  accuracy and completeness, addended by me as needed, and agree with information above.  Signed,   Marissa Ray, MD [facility] at [facility].  08/10/16 11:57 AM

## 2016-08-08 NOTE — Patient Instructions (Addendum)
Blood pressure elevated again today, but restart medication, then follow up within 1 week to recheck on medication.  Can decide on further changes then. If you have chest pains, new headaches, weakness, vision changes - call 911 or go to emergency room.   I will check a thyroid test for your hair loss, but that may need to be discussed with dermatology if testing is normal.    For colitis,Blood counts looked okay today, and stable from ER visit a few days ago. I will check a kidney function test, and as long as that is okay in your improving the next few days, we can try treating this outpatient. restart Vancomycin again 4 times per day for 2 weeks, then will taper to twice per day for 1 week, then once per day for 1 week, then every 2-3 days for anywhere from 2-8 weeks.  If you have any increased abdominal pain, fevers or worsening chills, in the next day or 2, proceed to the emergency room as we discussed. Otherwise follow-up in 2 days for repeat exam and blood counts.  I provided the initial 2 weeks of medication to your pharmacy, but we will need to refill that medicine for taper after 2 weeks.  It is very important that you follow up with gastroenterology - please call her office today for a repeat appointment. I did restart your proton pump inhibitor to help protect against another gastrointestinal bleed.  Start Qvar for wheezing, albuterol up to every 4 hours if needed.  If more frequent use of albuterol needed or sooner then every 4 hours, proceed to the emergency room. If your shortness of breath is not improving or any chest pains, proceed to the emergency room.  Return to the clinic or go to the nearest emergency room if any of your symptoms worsen or new symptoms occur.   Clostridium Difficile Infection Clostridium difficile (C. difficile or C. diff) infection is a condition that causes inflammation of the large intestine (colon). This condition can result in damage to the lining of your  colon and may lead to colitis. This infection can be passed from person to person (is contagious). What are the causes? C. diff is a bacterium that is normally found in the colon. This infection is caused when the balance of C. diff is changed and there is an overgrowth of C. diff. This is often caused by antibiotic use. What increases the risk? This condition is more likely to develop in people who:  Take antibiotic medicines.  Take a certain type of medicine called proton pump inhibitors over a long period of time (chronic use).  Are older.  Have had a C. diff infection before.  Have serious underlying conditions, such as colon cancer.  Are in the hospital.  Have a weak defense (immune) system.  Live in a place where there is a lot of contact with others, such as a nursing home.  Have had gastrointestinal (GI) tract surgery.  What are the signs or symptoms? Symptoms of this condition include:  Diarrhea. This may be bloody, watery, or yellow or green in color.  Fever.  Fatigue.  Loss of appetite.  Nausea.  Swelling, pain, or tenderness in the abdomen.  Dehydration. Dehydration can cause you to be tired and thirsty, have a dry mouth, and urinate less frequently.  How is this diagnosed? This condition is diagnosed with a medical history and physical exam. You may also have tests, including:  A test that checks for C. diff  in your stool.  Blood tests.  A sigmoidoscopy or colonoscopy to look at your colon. These procedures involve passing an instrument through your rectum to look at the inside of your colon.  How is this treated? Treatment for this condition includes:  Antibiotics that keep C. diff from growing.  Stopping the antibiotics you were on before the C. diff infection began. Only do this as told by your health care provider.  Fluids through an IV tube, if you are dehydrated.  Surgery to remove the infected part of the colon. This is rare.  Follow  these instructions at home: Eating and drinking  Drink enough fluid to keep your urine clear or pale yellow. Avoid milk, caffeine, and alcohol.  Follow specific rehydration instructions as told by your health care provider.  Eat small, frequent meals instead of large meals. Medicines  Take your antibiotic medicine as told by your health care provider. Do not stop taking the antibiotic even if you start to feel better unless your health care provider told you to do that.  Take over-the-counter and prescription medicines only as told by your health care provider.  Do not use medicines to help with diarrhea. General instructions  Wash your hands thoroughly before you prepare food and after you use the bathroom. Make sure people who live with you also wash their hands often.  Clean surfaces that you touch with a product that contains chlorine bleach.  Keep all follow-up visits as told by your health care provider. This is important. Contact a health care provider if:  Your symptoms do not get better with treatment.  Your symptoms get worse with treatment.  Your symptoms go away and then return.  You have a fever.  You have new symptoms. Get help right away if:  You have increasing pain or tenderness in your abdomen.  You have stool that is mostly bloody, or your stool looks dark black and tarry.  You cannot eat or drink without vomiting.  You have signs of dehydration, such as: ? Dark urine, very little urine, or no urine. ? Cracked lips. ? Not making tears when you cry. ? Dry mouth. ? Sunken eyes. ? Sleepiness. ? Weakness. ? Dizziness. This information is not intended to replace advice given to you by your health care provider. Make sure you discuss any questions you have with your health care provider. Document Released: 12/05/2004 Document Revised: 08/03/2015 Document Reviewed: 08/29/2014 Elsevier Interactive Patient Education  2017 [employer]  Bronchospasm,  Adult Bronchospasm is a tightening of the airways going into the lungs. During an episode, it may be harder to breathe. You may cough, and you may make a whistling sound when you breathe (wheeze). This condition often affects people with asthma. What are the causes? This condition is caused by swelling and irritation in the airways. It can be triggered by:  An infection (common).  Seasonal allergies.  An allergic reaction.  Exercise.  Irritants. These include pollution, cigarette smoke, strong odors, aerosol sprays, and paint fumes.  Weather changes. Winds increase molds and pollens in the air. Cold air may cause swelling.  Stress and emotional upset.  What are the signs or symptoms? Symptoms of this condition include:  Wheezing. If the episode was triggered by an allergy, wheezing may start right away or hours later.  Nighttime coughing.  Frequent or severe coughing with a simple cold.  Chest tightness.  Shortness of breath.  Decreased ability to exercise.  How is this diagnosed? This condition is  usually diagnosed with a review of your medical history and a physical exam. Tests, such as lung function tests, are sometimes done to look for other conditions. The need for a chest X-ray depends on where the wheezing occurs and whether it is the first time you have wheezed. How is this treated? This condition may be treated with:  Inhaled medicines. These open up the airways and help you breathe. They can be taken with an inhaler or a nebulizer device.  Corticosteroid medicines. These may be given for severe bronchospasm, usually when it is associated with asthma.  Avoiding triggers, such as irritants, infection, or allergies.  Follow these instructions at home: Medicines  Take over-the-counter and prescription medicines only as told by your health care provider.  If you need to use an inhaler or nebulizer to take your medicine, ask your health care provider to explain  how to use it correctly. If you were given a spacer, always use it with your inhaler. Lifestyle  Reduce the number of triggers in your home. To do this: ? Change your heating and air conditioning filter at least once a month. ? Limit your use of fireplaces and wood stoves. ? Do not smoke. Do not allow smoking in your home. ? Avoid using perfumes and fragrances. ? Get rid of pests, such as roaches and mice, and their droppings. ? Remove any mold from your home. ? Keep your house clean and dust free. Use unscented cleaning products. ? Replace carpet with wood, tile, or vinyl flooring. Carpet can trap dander and dust. ? Use allergy-proof pillows, mattress covers, and box spring covers. ? Wash bed sheets and blankets every week in hot water. Dry them in a dryer. ? Use blankets that are made of polyester or cotton. ? Wash your hands often. ? Do not allow pets in your bedroom.  Avoid breathing in cold air when you exercise. General instructions  Have a plan for seeking medical care. Know when to call your health care provider and local emergency services, and where to get emergency care.  Stay up to date on your immunizations.  When you have an episode of bronchospasm, stay calm. Try to relax and breathe more slowly.  If you have asthma, make sure you have an asthma action plan.  Keep all follow-up visits as told by your health care provider. This is important. Contact a health care provider if:  You have muscle aches.  You have chest pain.  The mucus that you cough up (sputum) changes from clear or white to yellow, green, gray, or bloody.  You have a fever.  Your sputum gets thicker. Get help right away if:  Your wheezing and coughing get worse, even after you take your prescribed medicines.  It gets even harder to breathe.  You develop severe chest pain. Summary  Bronchospasm is a tightening of the airways going into the lungs.  During an episode of bronchospasm, you  may have a harder time breathing. You may cough and make a whistling sound when you breathe (wheeze).  Avoid exposure to triggers such as smoke, dust, mold, animal dander, and fragrances.  When you have an episode of bronchospasm, stay calm. Try to relax and breathe more slowly. This information is not intended to replace advice given to you by your health care provider. Make sure you discuss any questions you have with your health care provider. Document Released: 02/28/2003 Document Revised: 02/22/2016 Document Reviewed: 02/22/2016 Elsevier Interactive Patient Education  2017 [employer].  IF you received an x-ray today, you will receive an invoice from [facility]. Please contact [facility] at [phone] with questions or concerns regarding your invoice.   IF you received labwork today, you will receive an invoice from [facility]. Please contact [facility] at [phone] with questions or concerns regarding your invoice.   Our billing staff will not be able to assist you with questions regarding bills from these companies.  You will be contacted with the lab results as soon as they are available. The fastest way to get your results is to activate your My Chart account. Instructions are located on the last page of this paperwork. If you have not heard from us regarding the results in 2 weeks, please contact this office.      

## 2016-08-09 ENCOUNTER — Telehealth: Payer: Self-pay

## 2016-08-09 LAB — BASIC METABOLIC PANEL
BUN/Creatinine Ratio: 14 (ref 12–28)
BUN: 11 mg/dL (ref 8–27)
CO2: 21 mmol/L (ref 18–29)
Calcium: 10.3 mg/dL (ref 8.7–10.3)
Chloride: 106 mmol/L (ref 96–106)
Creatinine, Ser: 0.77 mg/dL (ref 0.57–1.00)
GFR calc Af Amer: 94 mL/min/{1.73_m2} (ref >59)
GFR calc non Af Amer: 82 mL/min/{1.73_m2} (ref >59)
Glucose: 90 mg/dL (ref 65–99)
Potassium: 5 mmol/L (ref 3.5–5.2)
Sodium: 143 mmol/L (ref 134–144)

## 2016-08-09 LAB — TSH: TSH: 0.237 u[IU]/mL — ABNORMAL LOW (ref 0.450–4.500)

## 2016-08-09 MED ORDER — FLUTICASONE PROPIONATE HFA 44 MCG/ACT IN AERO: 2.0000 | INHALATION_SPRAY | Freq: Two times a day (BID) | RESPIRATORY_TRACT | 3 refills | Status: DC

## 2016-08-09 NOTE — Patient Outreach (Signed)
Telephone outreach to patient to obtain mRS was successfully completed. MRS=0  [place] Assistant

## 2016-08-09 NOTE — Telephone Encounter (Signed)
Changed to flovent. Can see if that is less costly.

## 2016-08-09 NOTE — Telephone Encounter (Signed)
[facility] faxed a Rx stating the plan doesn't cover Qvar and they need a New Rx for another medication.  Please advise or dispense accordingly.

## 2016-08-10 ENCOUNTER — Ambulatory Visit (INDEPENDENT_AMBULATORY_CARE_PROVIDER_SITE_OTHER): Payer: Medicare Other | Admitting: Family Medicine

## 2016-08-10 ENCOUNTER — Encounter: Payer: Self-pay | Admitting: Family Medicine

## 2016-08-10 VITALS — BP 152/79 | HR 88 | Temp 98.1°F | Resp 16 | Ht 63.5 in | Wt 110.1 lb

## 2016-08-10 DIAGNOSIS — I10 Essential (primary) hypertension: Secondary | ICD-10-CM | POA: Diagnosis not present

## 2016-08-10 DIAGNOSIS — I639 Cerebral infarction, unspecified: Secondary | ICD-10-CM | POA: Diagnosis not present

## 2016-08-10 DIAGNOSIS — A0472 Enterocolitis due to Clostridium difficile, not specified as recurrent: Secondary | ICD-10-CM | POA: Diagnosis not present

## 2016-08-10 DIAGNOSIS — J9801 Acute bronchospasm: Secondary | ICD-10-CM | POA: Diagnosis not present

## 2016-08-10 DIAGNOSIS — E860 Dehydration: Secondary | ICD-10-CM

## 2016-08-10 NOTE — Patient Instructions (Signed)
     IF you received an x-ray today, you will receive an invoice from [facility]. Please contact [facility] at [phone] with questions or concerns regarding your invoice.   IF you received labwork today, you will receive an invoice from [facility]. Please contact [facility] at [phone] with questions or concerns regarding your invoice.   Our billing staff will not be able to assist you with questions regarding bills from these companies.  You will be contacted with the lab results as soon as they are available. The fastest way to get your results is to activate your My Chart account. Instructions are located on the last page of this paperwork. If you have not heard from us regarding the results in 2 weeks, please contact this office.     

## 2016-08-10 NOTE — Progress Notes (Signed)
Subjective:    Patient ID: [name], female    DOB: [birth year], 66 y.o.   MRN: [id number]  08/10/2016  No chief complaint on file.   HPI This 66 y.o. female presents for 48 hour follow-up of C.Difficile colitis. Last follow-up with Dr. Neva Seat on 08/08/2016.  Doing better.  Last episode of diarrhea at 11:00am yesterday.  No diarrhea this morning.  Usually had 3 diarrhea stools before noon.  Still having chills; no fever.  No sweats.  +abdominal pain has improved slightly.  Taking hydrocodone two yesterday every 8-9 hours apart.  No hydrocodone this morning.  Severity 5-6/10.  No nausea; no vomiting.  Drinking orange juice; buys little bottles; had two yesterday; also drinking water; four glasses of water yesterday; 12 ounces of water per glass.  No water or orange juice yet today.  Did not sleep well last night; has not been sleeping well since sick.  Taking Vancomycin as prescribed am, dinner, supper, bedtime.  No dizziness.  Weight up one pound.  Weight goal is 120-125; has not been that weight  in six months.Eating regular foods; instant potatoes.  Eggs and toast.  COPD/wheezing: improving.  Still wheezing some.  Started QVAR two days ago; bid.  Albuterol not using since last visit.  SOB is better.  When wakes up, breathing is the worst.  Having some congestion right now; some not a lot.  Congestion clear.    HTN: restarted Carvidolol after last visit.  Took one extra dose of Carvedilol yesterday due to headache.    Had a CVA with meningitis.     Immunization History  Administered Date(s) Administered  . Influenza,inj,Quad PF,36+ Mos 02/19/2016  . Pneumococcal Polysaccharide-23 04/19/2016    Review of Systems  Constitutional: Positive for chills. Negative for diaphoresis, fatigue and fever.  Eyes: Negative for visual disturbance.  Respiratory: Positive for wheezing. Negative for cough and shortness of breath.   Cardiovascular: Negative for chest pain, palpitations and leg  swelling.  Gastrointestinal: Positive for abdominal distention, abdominal pain and diarrhea. Negative for anal bleeding, blood in stool, constipation, nausea, rectal pain and vomiting.  Endocrine: Negative for cold intolerance, heat intolerance, polydipsia, polyphagia and polyuria.  Neurological: Negative for dizziness, tremors, seizures, syncope, facial asymmetry, speech difficulty, weakness, light-headedness, numbness and headaches.    Past Medical History:  Diagnosis Date  . Arrhythmia   . Arthritis   . Fibromyalgia   . GERD (gastroesophageal reflux disease)   . H/O: substance abuse 2005   narcotic usage due to chronic back pain  . Heart murmur   . Hypertension   . PONV (postoperative nausea and vomiting)    Past Surgical History:  Procedure Laterality Date  . ABDOMINAL HYSTERECTOMY    . ABDOMINAL SURGERY    . APPENDECTOMY    . BREAST SURGERY    . CHOLECYSTECTOMY N/A 09/30/2012   Procedure: LAPAROSCOPIC CHOLECYSTECTOMY WITH INTRAOPERATIVE CHOLANGIOGRAM;  Surgeon: Wilmon Arms. Corliss Skains, MD;  Location: [facility];  Service: General;  Laterality: N/A;  . FRACTURE SURGERY     right upper arm  . OVARIAN CYST SURGERY     Allergies  Allergen Reactions  . Codeine Nausea And Vomiting  . Nsaids Other (See Comments)    Stomach ulcers   Current Outpatient Prescriptions  Medication Sig Dispense Refill  . albuterol (PROVENTIL HFA;VENTOLIN HFA) 108 (90 Base) MCG/ACT inhaler Inhale 1-2 puffs into the lungs every 4 (four) hours as needed for wheezing or shortness of breath. 1 Inhaler 0  . carvedilol (COREG) 6.25  MG tablet Take 1 tablet (6.25 mg total) by mouth 2 (two) times daily with a meal. 60 tablet 3  . fluticasone (FLOVENT HFA) 44 MCG/ACT inhaler Inhale 2 puffs into the lungs 2 (two) times daily. 1 Inhaler 3  . pantoprazole (PROTONIX) 40 MG tablet Take 1 tablet (40 mg total) by mouth at bedtime. 30 tablet 2  . pregabalin (LYRICA) 50 MG capsule Take by mouth.    . vancomycin (VANCOCIN) 125 MG  capsule Take 1 capsule (125 mg total) by mouth 4 (four) times daily. 56 capsule 0  . carisoprodol (SOMA) 350 MG tablet Take 1 tablet (350 mg total) by mouth 3 (three) times daily as needed for muscle spasms. 30 tablet 0  . vancomycin (VANCOCIN) 125 MG capsule 1 po BID for 2 weeks, then 1 po QD for 1 week, then 1 po QOD for 1 month. 50 capsule 0   No current facility-administered medications for this visit.    Social History   Social History  . Marital status: Married    Spouse name: N/A  . Number of children: N/A  . Years of education: N/A   Occupational History  . Not on file.   Social History Main Topics  . Smoking status: Former Smoker    Packs/day: 1.00    Years: 30.00    Types: Cigarettes  . Smokeless tobacco: Never Used  . Alcohol use No  . Drug use: No     Comment: Methadone for narcotic use  . Sexual activity: Not on file   Other Topics Concern  . Not on file   Social History Narrative  . No narrative on file   Family History  Problem Relation Age of Onset  . Alzheimer's disease Mother   . Cancer Father        prostate  . Heart disease Father        Objective:    BP (!) 152/79   Pulse 88   Temp 98.1 F (36.7 C) (Oral)   Resp 16   Ht 5' 3.5" (1.613 m)   Wt 110 lb 2 oz (50 kg)   SpO2 97%   BMI 19.20 kg/m  Physical Exam  Constitutional: She is oriented to person, place, and time. She appears well-developed and well-nourished. No distress.  HENT:  Head: Normocephalic and atraumatic.  Right Ear: External ear normal.  Left Ear: External ear normal.  Nose: Nose normal.  Mouth/Throat: Oropharynx is clear and moist.  Eyes: Conjunctivae and EOM are normal. Pupils are equal, round, and reactive to light.  Neck: Normal range of motion. Neck supple. Carotid bruit is not present. No thyromegaly present.  Cardiovascular: Normal rate, regular rhythm, normal heart sounds and intact distal pulses.  Exam reveals no gallop and no friction rub.   No murmur  heard. Pulmonary/Chest: Effort normal and breath sounds normal. No respiratory distress. She has no wheezes. She has no rales.  Abdominal: Soft. Bowel sounds are normal. She exhibits no distension and no mass. There is generalized tenderness. There is no rebound and no guarding.  Lymphadenopathy:    She has no cervical adenopathy.  Neurological: She is alert and oriented to person, place, and time. No cranial nerve deficit.  Skin: Skin is warm and dry. No rash noted. She is not diaphoretic. No erythema. No pallor.  Psychiatric: She has a normal mood and affect. Her behavior is normal.        Assessment & Plan:   1. Enteritis due to Clostridium difficile  2. Dehydration   3. Benign essential HTN   4. Bronchospasm    -slowly improving with Vancomycin orally for C. Difficile colitis; continue current regimen; encourage BRAT diet and aggressive hydration; tolerating fluids and solid food today; complete Vancomycin as prescribed; close follow-up in one week with [facility];sooner if worse. -weight has increased by one pound today; able to tolerate liquids better today; no indication for iv hydration. -blood pressure improved today with Carvedilol. Monitor closely with acute illness. -bronchospasm has also improved with QVAR therapy; continues to suffer with wheezing in mornings,yet much improved. -TSH abnormal at visit with Dr. Neva Seat two days ago; will warrant repeat TSH in upcoming few weeks once acute illness has improved.   No orders of the defined types were placed in this encounter.  No orders of the defined types were placed in this encounter.   Return in about 1 week (around 08/17/2016) for recheck with Dr. Neva Seat.   Lenda Baratta Paulita Fujita, M.D. Primary Care at [facility] previously [facility] [address] [place], [place]  [address] [phone] phone [phone] fax

## 2016-08-13 LAB — T3, FREE: T3, Free: 3.3 pg/mL (ref 2.0–4.4)

## 2016-08-13 LAB — T4, FREE: Free T4: 1.32 ng/dL (ref 0.82–1.77)

## 2016-08-13 LAB — SPECIMEN STATUS REPORT

## 2016-08-16 ENCOUNTER — Other Ambulatory Visit: Payer: Self-pay | Admitting: Family Medicine

## 2016-08-16 DIAGNOSIS — M436 Torticollis: Secondary | ICD-10-CM

## 2016-08-16 DIAGNOSIS — M62838 Other muscle spasm: Secondary | ICD-10-CM

## 2016-08-16 DIAGNOSIS — M542 Cervicalgia: Secondary | ICD-10-CM

## 2016-08-16 DIAGNOSIS — M549 Dorsalgia, unspecified: Secondary | ICD-10-CM

## 2016-08-17 ENCOUNTER — Telehealth: Payer: Self-pay | Admitting: Family Medicine

## 2016-08-17 ENCOUNTER — Ambulatory Visit (INDEPENDENT_AMBULATORY_CARE_PROVIDER_SITE_OTHER): Payer: Medicare Other | Admitting: Family Medicine

## 2016-08-17 ENCOUNTER — Other Ambulatory Visit: Payer: Self-pay | Admitting: Family Medicine

## 2016-08-17 ENCOUNTER — Encounter: Payer: Self-pay | Admitting: Family Medicine

## 2016-08-17 VITALS — BP 122/88 | HR 78 | Temp 98.0°F | Resp 18 | Ht 63.78 in | Wt 113.0 lb

## 2016-08-17 DIAGNOSIS — M549 Dorsalgia, unspecified: Secondary | ICD-10-CM | POA: Diagnosis not present

## 2016-08-17 DIAGNOSIS — M542 Cervicalgia: Secondary | ICD-10-CM

## 2016-08-17 DIAGNOSIS — M62838 Other muscle spasm: Secondary | ICD-10-CM | POA: Diagnosis not present

## 2016-08-17 DIAGNOSIS — R062 Wheezing: Secondary | ICD-10-CM

## 2016-08-17 DIAGNOSIS — M436 Torticollis: Secondary | ICD-10-CM | POA: Diagnosis not present

## 2016-08-17 DIAGNOSIS — R109 Unspecified abdominal pain: Secondary | ICD-10-CM

## 2016-08-17 DIAGNOSIS — Z8719 Personal history of other diseases of the digestive system: Secondary | ICD-10-CM | POA: Diagnosis not present

## 2016-08-17 DIAGNOSIS — A0472 Enterocolitis due to Clostridium difficile, not specified as recurrent: Secondary | ICD-10-CM

## 2016-08-17 DIAGNOSIS — I639 Cerebral infarction, unspecified: Secondary | ICD-10-CM

## 2016-08-17 MED ORDER — CARISOPRODOL 350 MG PO TABS: 350.0000 mg | ORAL_TABLET | Freq: Three times a day (TID) | ORAL | 0 refills | Status: DC | PRN

## 2016-08-17 MED ORDER — VANCOMYCIN HCL 125 MG PO CAPS: ORAL_CAPSULE | ORAL | 0 refills | Status: DC

## 2016-08-17 NOTE — Patient Instructions (Addendum)
  For stress Clostridium difficile treatment, continue the vancomycin 4 times per day. When the current prescription is running out, start the new prescription: twice per day for 1 week, then once per day for 1 week, then every 2 days for 1 month.   Keep appointment with gastroenterology this Wednesday.   Follow up with me in 1 month.   Return to the clinic or go to the nearest emergency room if any of your symptoms worsen or new symptoms occur.   IF you received an x-ray today, you will receive an invoice from [facility]. Please contact [facility] at [phone] with questions or concerns regarding your invoice.   IF you received labwork today, you will receive an invoice from [facility]. Please contact [facility] at [phone] with questions or concerns regarding your invoice.   Our billing staff will not be able to assist you with questions regarding bills from these companies.  You will be contacted with the lab results as soon as they are available. The fastest way to get your results is to activate your My Chart account. Instructions are located on the last page of this paperwork. If you have not heard from [place] regarding the results in 2 weeks, please contact this office.

## 2016-08-17 NOTE — Telephone Encounter (Signed)
Pt forgot to tell Carlota Raspberry at her OV today that she is out of her medication to help treat her muscle spasms.  She still uses the [facility] on [address] and [address]. [phone]

## 2016-08-17 NOTE — Telephone Encounter (Signed)
We can give her a short term until she follows up with neuro doc.   Is it the Soma she is needing?

## 2016-08-17 NOTE — Progress Notes (Signed)
By signing my name below, I, [name], attest that this documentation has been prepared under the direction and in the presence of [name], MD.  Electronically Signed: [name], Medical Scribe. 08/17/16. 8:43 AM.  Subjective:    Patient ID: [name], female    DOB: 18-Sep-1950, 66 y.o.   MRN: [id number]  HPI Chief Complaint  Patient presents with  . Abdominal Pain    follow-up for colitis     HPI Comments: [name] is a 66 y.o. female who presents to Primary Care at [facility] for follow-up. She has a complicated medical history. See recent notes.  Abdominal Pain: Has been treated for C. Diff colitis with recurrence when seen on May 31st. Restarted on Vancomycin 125 mg QID with plan for 2 weeks dosing then slow taper due to recurrence. She was treated out-pt as afebrile nl WBC and nl creatinine. She was seen in follow-up June 2nd with Dr. Tamala Julian. Was taking hydrocodone for pain and was improving. Diarrhea was also improving. Diarrhea has improved with 2 episodes a day, when she used to have 4-5 episodes a day. Pt reports associated sxs of right sided flank pain and occasional chills, but she overall feels better. Pt has been complaint with her protonix and Vancomycin. Her GI appt has been scheduled for Wednesday at 10:15 am (4 days away). Deneis bloody stool, melena, or other acute side effects.   GI Bleed: Hgb nl last visit without new signs of bleeding. She had been off of her PPI. She was restarted on protonix 40 mg QID and advised to call GI to reschedule appt. See notes above.  HTN: She had run out of medication at last visit, BP 170/90. Restarted coreg 6.25 mg BID. Pt is complaint with coreg and denies experiencing negative side effects.  Bronchial Spasm/Wheezing: Persistent/intermittent wheezing past few visits with her hx of GI bleed we differed oral prednisone. She was treated with albuterol and added Qvar last visit this was not covered by insurance so  rx'ed flovent. Reports her flovent BID has improved her breathing and uses albuterol QD.  Patient Active Problem List   Diagnosis Date Noted  . Acute encephalopathy   . Fever   . Left hemiparesis ([place])   . Generalized OA   . Fibromyalgia   . Tobacco abuse   . Substance abuse   . Benign essential HTN   . Tachycardia   . Chronic pain syndrome   . Acute blood loss anemia   . Leukocytosis   . Septic thrombophlebitis of sagittal sinus   . Acute respiratory failure ([place])   . Streptococcus pneumoniae meningitis   . Streptococcal bacteremia   . Meningitis   . Cerebral embolism with cerebral infarction 04/12/2016  . Stroke (cerebrum) ([place]) 04/12/2016  . Neck pain 04/04/2016  . Chronic abdominal pain 02/19/2016  . Essential hypertension 02/19/2016  . Bacterial conjunctivitis 02/19/2016  . Health care maintenance 02/19/2016  . ANXIETY 05/08/2006  . TOBACCO DEPENDENCE 05/08/2006  . ASTHMA, UNSPECIFIED 05/08/2006  . GASTROESOPHAGEAL REFLUX, NO ESOPHAGITIS 05/08/2006  . CONSTIPATION 05/08/2006  . CONVULSIONS, SEIZURES, NOS 05/08/2006   Past Medical History:  Diagnosis Date  . Arrhythmia   . Arthritis   . Fibromyalgia   . GERD (gastroesophageal reflux disease)   . H/O: substance abuse 2005   narcotic usage due to chronic back pain  . Heart murmur   . Hypertension   . PONV (postoperative nausea and vomiting)    Past Surgical History:  Procedure Laterality Date  .  ABDOMINAL HYSTERECTOMY    . ABDOMINAL SURGERY    . APPENDECTOMY    . BREAST SURGERY    . CHOLECYSTECTOMY N/A 09/30/2012   Procedure: LAPAROSCOPIC CHOLECYSTECTOMY WITH INTRAOPERATIVE CHOLANGIOGRAM;  Surgeon: Imogene Burn. Georgette Dover, MD;  Location: [facility];  Service: General;  Laterality: N/A;  . FRACTURE SURGERY     right upper arm  . OVARIAN CYST SURGERY     Allergies  Allergen Reactions  . Codeine Nausea And Vomiting  . Nsaids Other (See Comments)    Stomach ulcers   Prior to Admission medications   Medication Sig  Start Date End Date Taking? Authorizing Provider  albuterol (PROVENTIL HFA;VENTOLIN HFA) 108 (90 Base) MCG/ACT inhaler Inhale 1-2 puffs into the lungs every 4 (four) hours as needed for wheezing or shortness of breath. 07/15/16  Yes [name], MD  beclomethasone (QVAR) 40 MCG/ACT inhaler Inhale 2 puffs into the lungs 2 (two) times daily. 08/08/16  Yes [name], MD  carisoprodol (SOMA) 350 MG tablet Take 1 tablet (350 mg total) by mouth 3 (three) times daily as needed for muscle spasms. 07/19/16  Yes [name], MD  carvedilol (COREG) 6.25 MG tablet Take 1 tablet (6.25 mg total) by mouth 2 (two) times daily with a meal. 08/08/16  Yes [name], MD  fluticasone (FLOVENT HFA) 44 MCG/ACT inhaler Inhale 2 puffs into the lungs 2 (two) times daily. 08/09/16  Yes [name], MD  HYDROcodone-acetaminophen (NORCO/VICODIN) 5-325 MG tablet Take 1 tablet by mouth every 6 (six) hours as needed for moderate pain. 08/08/16  Yes [name], MD  pantoprazole (PROTONIX) 40 MG tablet Take 1 tablet (40 mg total) by mouth at bedtime. 08/08/16 09/07/16 Yes [name], MD  potassium chloride (K-DUR,KLOR-CON) 10 MEQ tablet Take by mouth. 05/28/16 05/28/17 Yes [provider]  pregabalin (LYRICA) 50 MG capsule Take by mouth. 06/06/16 06/06/17 Yes [provider]  vancomycin (VANCOCIN) 125 MG capsule Take 1 capsule (125 mg total) by mouth 4 (four) times daily. 08/08/16  Yes [name], MD   Social History   Social History  . Marital status: Married    Spouse name: N/A  . Number of children: N/A  . Years of education: N/A   Occupational History  . Not on file.   Social History Main Topics  . Smoking status: Former Smoker    Packs/day: 1.00    Years: 30.00    Types: Cigarettes  . Smokeless tobacco: Never Used  . Alcohol use No  . Drug use: No     Comment: Methadone for narcotic use  . Sexual activity: Not on file   Other Topics Concern  . Not on  file   Social History Narrative  . No narrative on file   Review of Systems  Constitutional: Positive for chills.  Respiratory: Positive for shortness of breath (controlled with medication) and wheezing (control with medications).   Gastrointestinal: Positive for diarrhea. Negative for abdominal distention, abdominal pain and blood in stool.  Genitourinary: Positive for flank pain.  Neurological: Negative for dizziness and light-headedness.   Objective:  Physical Exam  Constitutional: She appears well-developed and well-nourished. No distress.  HENT:  Head: Normocephalic and atraumatic.  Eyes: Conjunctivae are normal.  Neck: Neck supple.  Cardiovascular: Normal rate, regular rhythm and normal heart sounds.  Exam reveals no gallop and no friction rub.   No murmur heard. Pulmonary/Chest: Effort normal and breath sounds normal. No respiratory distress. She has no wheezes. She  has no rales.  Abdominal: She exhibits no distension. Bowel sounds are increased. There is tenderness. There is no rebound and no guarding.  Slightly hyperactive bowel sounds Slight discomfort right side  Neurological: She is alert.  Skin: Skin is warm and dry.  Psychiatric: She has a normal mood and affect. Her behavior is normal.  Nursing note and vitals reviewed.   Vitals:   08/17/16 0830  BP: 122/88  Pulse: 78  Resp: 18  Temp: 98 F (36.7 C)  TempSrc: Oral  SpO2: 98%  Weight: 113 lb (51.3 kg)  Height: 5' 3.78" (1.62 m)  Body mass index is 19.53 kg/m. Assessment & Plan:   [name] is a 66 y.o. female C. difficile colitis Abdominal pain, unspecified abdominal location  - on recurrent C diff treatment, improving.   - Continue follow-up as planned with GI.  -Continue taper of vancomycin as below. RTC precautions  History of GI bleed  -Denies recent symptoms. Continue proton pump inhibitor, GI follow-up  Wheezing  -Improved with inhaled corticosteroid. Continue Flovent, albuterol if  needed.  Neck pain - Plan: carisoprodol (SOMA) 350 MG tablet Torticollis - Plan: carisoprodol (SOMA) 350 MG tablet Muscle spasm - Plan: carisoprodol (SOMA) 350 MG tablet Back pain, unspecified back location, unspecified back pain laterality, unspecified chronicity - Plan: carisoprodol (SOMA) 350 MG tablet  - Noted after visit that she needs a somewhat refill. I talked to her on the phone, there is discussion on whether she may or may not be able to have surgery, and is out of [place]. I refilled that temporarily until decided on next step in treatment of her neck/back pain.    Meds ordered this encounter  Medications  . vancomycin (VANCOCIN) 125 MG capsule    Sig: 1 po BID for 2 weeks, then 1 po QD for 1 week, then 1 po QOD for 1 month.    Dispense:  50 capsule    Refill:  0  . carisoprodol (SOMA) 350 MG tablet    Sig: Take 1 tablet (350 mg total) by mouth 3 (three) times daily as needed for muscle spasms.    Dispense:  30 tablet    Refill:  0   Patient Instructions    For stress Clostridium difficile treatment, continue the vancomycin 4 times per day. When the current prescription is running out, start the new prescription: twice per day for 1 week, then once per day for 1 week, then every 2 days for 1 month.   Keep appointment with gastroenterology this Wednesday.   Follow up with me in 1 month.   Return to the clinic or go to the nearest emergency room if any of your symptoms worsen or new symptoms occur.   IF you received an x-ray today, you will receive an invoice from [facility]. Please contact [facility] at [phone] with questions or concerns regarding your invoice.   IF you received labwork today, you will receive an invoice from [facility]. Please contact [facility] at [phone] with questions or concerns regarding your invoice.   Our billing staff will not be able to assist you with questions regarding bills from these companies.  You will be  contacted with the lab results as soon as they are available. The fastest way to get your results is to activate your My Chart account. Instructions are located on the last page of this paperwork. If you have not heard from [place] regarding the results in 2 weeks, please contact this office.  I personally performed the services described in this documentation, which was scribed in my presence. The recorded information has been reviewed and considered for accuracy and completeness, addended by me as needed, and agree with information above.  Signed,   [name], MD [facility] at [place].  08/17/16 4:46 PM

## 2016-08-19 NOTE — Telephone Encounter (Signed)
Rx faxed and pt notified, verbalized understanding

## 2016-08-21 ENCOUNTER — Ambulatory Visit (INDEPENDENT_AMBULATORY_CARE_PROVIDER_SITE_OTHER): Payer: Medicare Other | Admitting: Physician Assistant

## 2016-08-21 ENCOUNTER — Telehealth: Payer: Self-pay

## 2016-08-21 ENCOUNTER — Encounter: Payer: Self-pay | Admitting: Physician Assistant

## 2016-08-21 VITALS — BP 132/80 | HR 63 | Ht 64.0 in | Wt 114.0 lb

## 2016-08-21 DIAGNOSIS — D508 Other iron deficiency anemias: Secondary | ICD-10-CM

## 2016-08-21 DIAGNOSIS — R935 Abnormal findings on diagnostic imaging of other abdominal regions, including retroperitoneum: Secondary | ICD-10-CM | POA: Diagnosis not present

## 2016-08-21 DIAGNOSIS — Z8719 Personal history of other diseases of the digestive system: Secondary | ICD-10-CM

## 2016-08-21 DIAGNOSIS — R109 Unspecified abdominal pain: Secondary | ICD-10-CM

## 2016-08-21 DIAGNOSIS — A0472 Enterocolitis due to Clostridium difficile, not specified as recurrent: Secondary | ICD-10-CM | POA: Diagnosis not present

## 2016-08-21 NOTE — Patient Instructions (Addendum)
If you are age 65 or older, your body mass index should be between 23-30. Your Body mass index is 19.57 kg/m. If this is out of the aforementioned range listed, please consider follow up with your Primary Care Provider.  If you are age 52 or younger, your body mass index should be between 19-25. Your Body mass index is 19.57 kg/m. If this is out of the aformentioned range listed, please consider follow up with your Primary Care Provider.   You have been scheduled for a CT scan of the abdomen and pelvis at [facility] ([address]---this is in the same building as Press [facility]).   You are scheduled on 08/27/16 at 330. You should arrive 15 minutes prior to your appointment time for registration. Please follow the written instructions below on the day of your exam:  WARNING: IF YOU ARE ALLERGIC TO IODINE/X-RAY DYE, PLEASE NOTIFY RADIOLOGY IMMEDIATELY AT [phone]! YOU WILL BE GIVEN A 13 HOUR PREMEDICATION PREP.  1) Do not eat anything after 1130 am (4 hours prior to your test) 2) You have been given 2 bottles of oral contrast to drink. The solution may taste               better if refrigerated, but do NOT add ice or any other liquid to this solution. Shake             well before drinking.    Drink 1 bottle of contrast @ 130 pm (2 hours prior to your exam)  Drink 1 bottle of contrast @ 230 pm (1 hour prior to your exam)  You may take any medications as prescribed with a small amount of water except for the following: Metformin, Glucophage, Glucovance, Avandamet, Riomet, Fortamet, Actoplus Met, Janumet, Glumetza or Metaglip. The above medications must be held the day of the exam AND 48 hours after the exam.  The purpose of you drinking the oral contrast is to aid in the visualization of your intestinal tract. The contrast solution may cause some diarrhea. Before your exam is started, you will be given a small amount of fluid to drink. Depending on your individual set of  symptoms, you may also receive an intravenous injection of x-ray contrast/dye. Plan on being at [facility] for 30 minutes or longer, depending on the type of exam you are having performed.  This test typically takes 30-45 minutes to complete.  If you have any questions regarding your exam or if you need to reschedule, you may call the CT department at [phone] between the hours of 8:00 am and 5:00 pm, Monday-Friday.  ________________________________________________________________________  Continue Vancomycin as prescribed.  Continue Pantoprazole 40 gm twice daily.  Follow up with Dr Billie Lade on 10/21/16 at 900 am.  Thank you for choosing me and [facility].   Ellouise Newer, PA-C

## 2016-08-21 NOTE — Progress Notes (Addendum)
Chief Complaint: C. difficile colitis, diarrhea, abdominal pain, history of GI bleeding  HPI:  [name] is a 66 year old Caucasian female with a past medical history of narcotic abuse, fibromyalgia, GERD, heart murmur, hypertension, GI bleed and C. difficile, who was referred to me by [name], MD for a complaint of C. difficile colitis, diarrhea, abdominal pain and history of GI bleeding .      Multiple outside notes are reviewed at time of patient's visits. Patient was diagnosed with pneumococcal meningitis and acute infectious encephalopathy/sagittal sinus thrombosis on 04/12/16. She was on antibiotics including ceftriaxone via PICC line for 6 weeks. She was also started on Xarelto at that time for 3 months.     Patient then presented to the ED on 05/05/16 with a complaint of right-sided/right lower quadrant abdominal pain and body aches. She was on week 3 of her treatment for bacterial meningitis. CT scan of brain at that time was negative for an acute process, white blood cell count was elevated but near her baseline. She was started on Carafate and referred to GI.    05/14/16 admitted to [facility] for generalized weakness along with the diagnosis of acute blood loss anemia and gastrointestinal hemorrhage with melena. Her Xarelto was discontinued. It was noted that she would need an outpatient colonoscopy. Patient was just finishing her 6 week therapy of IV antibiotics that week for her meningitis. At time of arrival to the ED she had a hemoglobin of 5.1, WBC 22. Patient was transfused and had an unremarkable EGD on 3/9. Arranged for outpatient colonoscopy. Anticoagulation was held that time. Her hemoglobin was stable and her stools were brown at discharge. It was also noted that patient had a history of chronic pain and opioid abuse and previously was on methadone. Apparently her PCP had noted that she had right-sided abdominal pain for many years despite status post cholecystectomy and  appendectomy. She was told to continue Tylenol when necessary at home.   06/06/16 patient saw PCP with back pain and a fever, given Lyrica and prednisone as well as amoxicillin for an upper respiratory infection.   06/18/16 seen in the ED and [facility] for neck pain. White blood cell count 15.7 and hemoglobin normal at that time. Patient was given Valium for a neck strain.   06/19/16 patient was scheduled for a colonoscopy for this date but could not go because she was having "such severe abdominal pain and diarrhea".   06/21/16 patient was seen in the ED at [facility] again for muscle spasms. Patient given [name] for torticollis and cervical paraspinal muscle spasm and arrange sports medicine appointment.   06/22/16 patient seen in the ED for neck pain. This time at [facility]. At that time hemoglobin 15, white count 10.6. Patient given oxycodone, 13 tablets.  07/12/16 patient seen initially by family medicine [place]. She continued to complain of abdominal pain and 4-5 episodes of diarrhea daily.CT ordered and labs   07/13/16 CT abdomen and pelvis with contrast for abdominal pain, nausea and diarrhea. Patient found to have wall thickening involving the distal stomach and proximal duodenum suggestive of peptic ulcer disease. No overt ulceration or evidence of perforation. Also wall thickening involving the cecum and proximal to mid descending colon during. Findings are consistent with colitis. No associated abscess or bowel obstruction. At that time patient was started on vancomycin 125 mg by mouth 4 times a day.   08/17/16 visit with PCP. This patient had been treated for C. difficile colitis with recurrence  on May 31. She restarted vancomycin 125 mg 4 times a day with a plan for 2 week dosing then slow taper. She was afebrile with a normal white blood cell count normal creatinine. Diarrhea improved at that time and she continued with abdominal pain.   Today, the patient describes that she has had a rough year. She briefly tells  me about all the history above. She explains that she has moved back to the area and is living with her son currently, so will be seeking care in this area. She describes that when she was diagnosed with C.Diff at the beginning of May she was started on Vancomycin, this has improved some. Initially, she was having 7-8 loose watery stools per day and this has improved down to 2. Currentl,  she is on her second round of 2 weeks of vancomycin 125 mg by mouth 4 times a day and is set to start a taper in 3 days of this medication. She will then be taking one tablet by mouth twice a day for 2 weeks then one by mouth daily day for 1 week and then 1 by mouth every other day for 1 month. The patient tells me that she continues with some chills. She also continues with a 10/10 right sided abdominal pain which radiates up under her ribs and into her chest. She tells me this is so badconstantly that she "can't sleep". This has not changed at all since being on antibiotics. She does tell me that she was on hydrocodone for a few days which seemed to "help some", but she was not given much of this.   In regards to her recent GI bleed, patient tells me that she is started on Protonix 40 mg twice a day. She continues to take this and has had no reflux symptoms but does have occasional nausea. Patient also has associated weight loss over the past 2 months of about 10 pounds. She also tells me that she "just doesn't feel like eating".   Patient denies a fever, blood in her stool, black tarry sticky stool, vomiting, dysphagia or increasing gas or bloating.  Past Medical History:  Diagnosis Date  . Arrhythmia   . Arthritis   . Fibromyalgia   . GERD (gastroesophageal reflux disease)   . H/O: substance abuse 2005   narcotic usage due to chronic back pain  . Heart murmur   . Hypertension   . PONV (postoperative nausea and vomiting)     Past Surgical History:  Procedure Laterality Date  . ABDOMINAL HYSTERECTOMY    .  ABDOMINAL SURGERY    . APPENDECTOMY    . BREAST SURGERY    . CHOLECYSTECTOMY N/A 09/30/2012   Procedure: LAPAROSCOPIC CHOLECYSTECTOMY WITH INTRAOPERATIVE CHOLANGIOGRAM;  Surgeon: Imogene Burn. Georgette Dover, MD;  Location: [facility];  Service: General;  Laterality: N/A;  . FRACTURE SURGERY     right upper arm  . OVARIAN CYST SURGERY      Current Outpatient Prescriptions  Medication Sig Dispense Refill  . albuterol (PROVENTIL HFA;VENTOLIN HFA) 108 (90 Base) MCG/ACT inhaler Inhale 1-2 puffs into the lungs every 4 (four) hours as needed for wheezing or shortness of breath. 1 Inhaler 0  . carisoprodol (SOMA) 350 MG tablet Take 1 tablet (350 mg total) by mouth 3 (three) times daily as needed for muscle spasms. 30 tablet 0  . carvedilol (COREG) 6.25 MG tablet Take 1 tablet (6.25 mg total) by mouth 2 (two) times daily with a meal. 60 tablet 3  .  fluticasone (FLOVENT HFA) 44 MCG/ACT inhaler Inhale 2 puffs into the lungs 2 (two) times daily. 1 Inhaler 3  . pantoprazole (PROTONIX) 40 MG tablet Take 1 tablet (40 mg total) by mouth at bedtime. 30 tablet 2  . pregabalin (LYRICA) 50 MG capsule Take by mouth.    . vancomycin (VANCOCIN) 125 MG capsule Take 1 capsule (125 mg total) by mouth 4 (four) times daily. 56 capsule 0  . vancomycin (VANCOCIN) 125 MG capsule 1 po BID for 2 weeks, then 1 po QD for 1 week, then 1 po QOD for 1 month. 50 capsule 0   No current facility-administered medications for this visit.     Allergies as of 08/21/2016 - Review Complete 08/21/2016  Allergen Reaction Noted  . Codeine Nausea And Vomiting 08/19/2012  . Nsaids Other (See Comments) 08/19/2012    Family History  Problem Relation Age of Onset  . Alzheimer's disease Mother   . Cancer Father        prostate  . Heart disease Father     Social History   Social History  . Marital status: Married    Spouse name: N/A  . Number of children: N/A  . Years of education: N/A   Occupational History  . Not on file.   Social  History Main Topics  . Smoking status: Former Smoker    Packs/day: 1.00    Years: 30.00    Types: Cigarettes  . Smokeless tobacco: Never Used  . Alcohol use No  . Drug use: No     Comment: Methadone for narcotic use  . Sexual activity: Not on file   Other Topics Concern  . Not on file   Social History Narrative  . No narrative on file    Review of Systems:    Constitutional: No fever Skin: No rash Cardiovascular: No chest pain Respiratory: No SOB  Gastrointestinal: See HPI and otherwise negative Genitourinary: No dysuria Neurological: No headache, dizziness or syncope Musculoskeletal: No new muscle or joint pain Hematologic: No bruising Psychiatric: No history of depression or anxiety   Physical Exam:  Vital signs: BP 132/80   Pulse 63   Ht 5\' 4"  (1.626 m)   Wt 114 lb (51.7 kg)   SpO2 96%   BMI 19.57 kg/m   Constitutional: Ill appearing Caucasian female appears to be in mild distress, Well developed, alert and cooperative Head:  Normocephalic and atraumatic. Eyes:   PEERL, EOMI. No icterus. Conjunctiva pink. Ears:  Normal auditory acuity. Neck:  Supple Throat: Oral cavity and pharynx without inflammation, swelling or lesion.  Respiratory: Respirations even and unlabored. Lungs clear to auscultation bilaterally.   No wheezes, crackles, or rhonchi.  Cardiovascular: Normal S1, S2. No MRG. Regular rate and rhythm. No peripheral edema, cyanosis or pallor.  Gastrointestinal:  Soft, nondistended, Marked ttp to right side of abdomen with involuntary guarding  Normal bowel sounds. No appreciable masses or hepatomegaly. Rectal:  Not performed.  Msk:  Symmetrical without gross deformities. Without edema, no deformity or joint abnormality.  Neurologic:  Alert and  oriented x4;  grossly normal neurologically.  Skin:   Dry and intact without significant lesions or rashes. Psychiatric:  Demonstrates good judgement and reason without abnormal affect or behaviors.  MOST RECENT  LABS AND IMAGING: CBC    Component Value Date/Time   WBC 10.1 08/08/2016 1305   WBC 9.8 08/04/2016 1242   RBC 4.80 08/08/2016 1305   RBC 4.42 08/04/2016 1242   HGB 15.4 08/08/2016 1305   HGB  13.9 08/04/2016 1242   HCT 43.2 08/08/2016 1305   HCT 41.0 08/04/2016 1242   PLT 423 (H) 08/04/2016 1242   MCV 89.9 08/08/2016 1305   MCH 32.1 (A) 08/08/2016 1305   MCH 31.4 08/04/2016 1242   MCHC 35.7 (A) 08/08/2016 1305   MCHC 33.9 08/04/2016 1242   RDW 14.6 08/04/2016 1242   LYMPHSABS 3.5 06/18/2016 1921   MONOABS 1.3 (H) 06/18/2016 1921   EOSABS 0.2 06/18/2016 1921   BASOSABS 0.0 06/18/2016 1921    CMP     Component Value Date/Time   NA 143 08/08/2016 1358   K 5.0 08/08/2016 1358   CL 106 08/08/2016 1358   CO2 21 08/08/2016 1358   GLUCOSE 90 08/08/2016 1358   GLUCOSE 108 (H) 08/04/2016 1242   BUN 11 08/08/2016 1358   CREATININE 0.77 08/08/2016 1358   CALCIUM 10.3 08/08/2016 1358   PROT 7.1 08/04/2016 1242   PROT 6.6 07/12/2016 1859   ALBUMIN 4.1 08/04/2016 1242   ALBUMIN 4.3 07/12/2016 1859   AST 15 08/04/2016 1242   ALT 11 (L) 08/04/2016 1242   ALKPHOS 103 08/04/2016 1242   BILITOT 0.4 08/04/2016 1242   BILITOT <0.2 07/12/2016 1859   GFRNONAA 82 08/08/2016 1358   GFRAA 94 08/08/2016 1358   EXAM: CT ABDOMEN AND PELVIS WITH CONTRAST 07/13/16  TECHNIQUE: Multidetector CT imaging of the abdomen and pelvis was performed using the standard protocol following bolus administration of intravenous contrast.  CONTRAST:  176mL ISOVUE-300 IOPAMIDOL (ISOVUE-300) INJECTION 61%  COMPARISON:  10/18/2010  FINDINGS: Lower chest: No acute abnormality.  Hepatobiliary: No focal liver abnormality is seen. Status post cholecystectomy. No biliary dilatation.  Pancreas: Unremarkable. No pancreatic ductal dilatation or surrounding inflammatory changes.  Spleen: Normal in size without focal abnormality.  Adrenals/Urinary Tract: Adrenal glands have a normal appearance. Both  kidneys demonstrate scattered small cysts without evidence of solid masses. No hydronephrosis or urinary tract calculi identified. The bladder is moderately distended but otherwise unremarkable.  Stomach/Bowel: There is evidence of wall thickening involving the gastric antrum, pyloric region and proximal duodenum. This is not associated with overt evidence of ulceration by CT or free air. Wall thickening is also seen involving the cecum and proximal to mid ascending colon. No evidence of perforation, abscess or bowel obstruction.  Vascular/Lymphatic: No significant vascular findings are present. No enlarged abdominal or pelvic lymph nodes.  Reproductive: Status post hysterectomy. No adnexal masses.  Other: No abdominal wall hernia or abnormality. No abdominopelvic ascites.  Musculoskeletal: Progressive degenerative disc disease is present at L4-5.  IMPRESSION: 1. Wall thickening involving the distal stomach and proximal duodenum suggestive of peptic ulcer disease. No overt ulceration or evidence of perforation. 2. Wall thickening involving the cecum and proximal to mid descending colon. Findings are consistent with colitis. No associated abscess or bowel obstruction.   Electronically Signed   By: Aletta Edouard M.D.   On: 07/13/2016 14:03  DG ABDOMEN ACUTE W/ 1V CHEST 07/15/16  COMPARISON:  None available  FINDINGS: Normal heart size and vascularity. Mild hyperinflation, suggesting COPD/ emphysema. Trachea is midline. No focal pneumonia, collapse or consolidation. Negative for edema, effusion or pneumothorax. Trachea is midline. Bones are osteopenic.  No free air evident. Scattered air and residual contrast throughout the bowel. Negative for obstruction or significant ileus. Diverticulosis noted. No renal calcifications or acute osseous finding.  IMPRESSION: No acute finding in the chest.  Thoracic aortic atherosclerosis  Negative for free air or  obstruction  Diverticulosis   Electronically Signed  By: Eugenie Filler M.D.   On: 07/15/2016 13:08  Assessment: 1. Suspected C. Diff colitis: Patient diagnosed with C. difficile colitis 07/15/16, question underlying IBD as patient continues with severe abdominal pain and some loose stools irregardless of vancomycin 125 mg PO qidx4 weeks; it may be that patient needs a fecal transplant and/or colonoscopy to evaluate for underlying IBD 2. History of GI bleed: Evaluated at [facility] with EGD, which was normal, for melena and a hemoglobin of 5.1 while on blood thinner, since then no further melenic stools and hemoglobin has returned to normal, though imaging is now abnormal as above 3. IDA: Discovered during patient's stay in the hospital in February for bacterial meningitis, patient then had melena after being started on a blood thinner around that time and had EGD in March which was normal, hemoglobin has since returned normal as patient has remained off of her blood thinner, no cause found; certainly with the recent CT above there is some confusion as to cause of this bleeding, question upper versus lower GI bleed 4. Abdominal pain: Right-sided abdominal pain, this is acute on chronic, certainly with CT findings above there is an explanation for this with findings of colitis, but no improvement in pain over the past month after being on vancomycin 125 mg 4 times a day for C. difficile colitis, also vague history of chronic right sided abdominal pain; concern for underlying IBD 5. Abnormal Ct abdomen pelvis: as above  Plan: 1. After extensive review of the patient's chart, it appears that GI bleed was likely instigated by blood thinner and that the patient was placed on after recent meningitis and thrombosis, this has been DC'd and patient has seen no bleeding since and hemoglobin is now normal. Patient's EGD on 05/17/16 was normal at that time. Plans were for colonoscopy for further investigation in the  future. This will still need to occur, and possibly sooner rather than later due to patient's abnormal findings on recent CT and continued abdominal pain. Question IBD. 2. Patient to continue her Vancomycin as prescribed at this time. She was instructed not to start another taper unless she is retested for C. difficile. 3. Patient encouraged to continue her Protonix 40 mg twice a day. After reviewing imaging above, there was finding of thickening in her stomach as well. EGD would likely be beneficial in the future as well. Especially since EGD in March was normal, this CT finding is unexplained. 4. Patient was scheduled for CT abdomen and pelvis to see if there've been any changes since patient has been on antibiotics over the past month. If no change or worsening colitis would recommend an urgent EGD and colonoscopy for further evaluation of CT. This was discussed with the patient. 5. Did discuss this case with Dr. Hilarie Fredrickson the time of patient's visit. Patient will have a follow-up with me 2 weeks from now to review findings above and to consider whether or not she needs urgent procedures aligned. 6. At this time did not give the patient any pain medication as it appears this is been a problem for her in the past.  Greater than 80 min was spent on consultation and coordination of care for this patient with extensive medical history.  Ellouise Newer, PA-C [facility] 08/21/2016, 10:33 AM  Addendum: Reviewed and agree with initial management. Pyrtle, Lajuan Lines, MD   Cc: [name], MD

## 2016-08-22 ENCOUNTER — Telehealth: Payer: Self-pay | Admitting: Physician Assistant

## 2016-08-22 NOTE — Telephone Encounter (Signed)
Saw [name] yesterday, woke up at 1 am with lower abd pain that radiates to the upper right side.  She is taking vancomycin as directed.  Has had 4 watery diarrhea stools today, no blood or mucous.  The pain is constant, is taking 2 extra strength tylenol every 6 hours with no relief.  10/10 pain.  She tells me the pain is excruciating and "feels like appendicitis"  Pt was advised to go to the ED for the severe abd pain.

## 2016-08-22 NOTE — Telephone Encounter (Signed)
Yes. She did have a CT abdomen pelvis ordered. IF she ends up in the ER I would hope they would repeat this. Thank you-JLL

## 2016-08-26 ENCOUNTER — Telehealth: Payer: Self-pay | Admitting: Physician Assistant

## 2016-08-26 NOTE — Telephone Encounter (Signed)
Routed conversation to Dr. Hilarie Fredrickson and Ellouise Newer.

## 2016-08-27 ENCOUNTER — Telehealth: Payer: Self-pay

## 2016-08-27 ENCOUNTER — Inpatient Hospital Stay: Admission: RE | Admit: 2016-08-27 | Payer: Medicare Other | Source: Ambulatory Visit

## 2016-08-27 NOTE — Telephone Encounter (Signed)
Spoke with pt and let her know Dr. Hilarie Fredrickson feels that she should go to the ER with her worsening abdominal pain. Pt also states she has not been able to keep anything down. Pt agreed and states that she plans to go to the ER in the AM. Pt states that her husband was in the ER today and she plans to go tomorrow.

## 2016-08-27 NOTE — Telephone Encounter (Signed)
Erroneous encounter

## 2016-08-27 NOTE — Telephone Encounter (Signed)
Stacey from [facility] called stating that patient called her this morning at 8 am stating that she could not keep anything down and could not drink the contrast and therefore would not be keeping her appt for her CT this afternoon.  Erline Levine said that she tried to encourage the patient to keep appointment but patient insisted that she was not able to drink the contrast due to her colitis and stated that she may go to the ER today.  Anderson Malta is out; so sending this to you) Thanks, Peter Congo

## 2016-08-27 NOTE — Telephone Encounter (Signed)
Forwarding message to Dr. Hilarie Fredrickson, her gastroenterologist, per establishing care at 08/21/2016 office visit with Ellouise Newer.

## 2016-08-27 NOTE — Telephone Encounter (Signed)
Complicated patient seen by Ellouise Newer, PA-C recently in the office CT is felt necessary I would advise patient go to the ER given her continued complaints of abdominal pain and need for further evaluation/workup

## 2016-08-28 NOTE — Telephone Encounter (Signed)
Letter mailed to pt.  

## 2016-08-28 NOTE — Telephone Encounter (Signed)
Definitively mail letter She contacted [place] yesterday and was advised to go to the ED, which I still recommend if she is having worsening abd pain, fever, chills, bleeding, etc. Hopefully we will hear from her soon

## 2016-08-28 NOTE — Telephone Encounter (Signed)
Attempted to call pt to let her know she needs to reschedule her CT scan and cannot reach pt at either number listed. Cell number states no longer in service. Dr. Hilarie Fredrickson do you want a letter mailed to the patient letting her know that she needs to reschedule the CT? Please advise.

## 2016-08-28 NOTE — Telephone Encounter (Signed)
error 

## 2016-09-04 ENCOUNTER — Ambulatory Visit: Payer: Medicare Other | Admitting: Gastroenterology

## 2016-09-05 ENCOUNTER — Telehealth: Payer: Self-pay | Admitting: Family Medicine

## 2016-09-05 ENCOUNTER — Other Ambulatory Visit: Payer: Self-pay | Admitting: Family Medicine

## 2016-09-05 ENCOUNTER — Ambulatory Visit: Payer: Medicare Other | Admitting: Gastroenterology

## 2016-09-05 DIAGNOSIS — M62838 Other muscle spasm: Secondary | ICD-10-CM

## 2016-09-05 DIAGNOSIS — M549 Dorsalgia, unspecified: Secondary | ICD-10-CM

## 2016-09-05 DIAGNOSIS — R059 Cough, unspecified: Secondary | ICD-10-CM

## 2016-09-05 DIAGNOSIS — M436 Torticollis: Secondary | ICD-10-CM

## 2016-09-05 DIAGNOSIS — R062 Wheezing: Secondary | ICD-10-CM

## 2016-09-05 DIAGNOSIS — R05 Cough: Secondary | ICD-10-CM

## 2016-09-05 DIAGNOSIS — M542 Cervicalgia: Secondary | ICD-10-CM

## 2016-09-05 NOTE — Telephone Encounter (Signed)
Pt's husband is calling to see if we can refill the Soma medication.  Pt has been trying to put the request through the pharmacy but it is not going through.  Pt is in immense pain and is in need of this medication soon since she is completely out. Pt still uses the [facility] on [address]. Please advise [phone]

## 2016-09-06 MED ORDER — ALBUTEROL SULFATE HFA 108 (90 BASE) MCG/ACT IN AERS: 1.0000 | INHALATION_SPRAY | RESPIRATORY_TRACT | 0 refills | Status: DC | PRN

## 2016-09-06 NOTE — Telephone Encounter (Signed)
Notified patient that rx was called into [facility] and ready for pick up

## 2016-09-06 NOTE — Telephone Encounter (Signed)
Pt's husband called and said the pharmacy stated prescription could not be sent electronically and they need to pick it up. Pt's husband would like to come pick this up today because pt is still in pain. Please call him at [phone] when ready to pick up.

## 2016-09-06 NOTE — Telephone Encounter (Signed)
Pt's husband following up on refill request. He had not contacted the pharmacy yet so I told him to call them and see if they have received the refill and if not to let us know. Pt husband callback number is [phone].

## 2016-09-06 NOTE — Telephone Encounter (Signed)
I will not be in the office today, but I called [facility] at [address] and called that med in.  Please advise patient/husband that it is ready.

## 2016-09-08 ENCOUNTER — Emergency Department (HOSPITAL_COMMUNITY): Payer: Medicare Other

## 2016-09-08 ENCOUNTER — Encounter (HOSPITAL_COMMUNITY): Payer: Self-pay | Admitting: Emergency Medicine

## 2016-09-08 ENCOUNTER — Inpatient Hospital Stay (HOSPITAL_COMMUNITY)
Admission: EM | Admit: 2016-09-08 | Discharge: 2016-09-11 | DRG: 439 | Disposition: A | Discharge status: Home / Self Care | Payer: Medicare Other | Attending: Family Medicine | Admitting: Family Medicine

## 2016-09-08 DIAGNOSIS — Z8661 Personal history of infections of the central nervous system: Secondary | ICD-10-CM

## 2016-09-08 DIAGNOSIS — R109 Unspecified abdominal pain: Secondary | ICD-10-CM

## 2016-09-08 DIAGNOSIS — K219 Gastro-esophageal reflux disease without esophagitis: Secondary | ICD-10-CM | POA: Diagnosis present

## 2016-09-08 DIAGNOSIS — K759 Inflammatory liver disease, unspecified: Secondary | ICD-10-CM | POA: Diagnosis present

## 2016-09-08 DIAGNOSIS — Z792 Long term (current) use of antibiotics: Secondary | ICD-10-CM | POA: Diagnosis not present

## 2016-09-08 DIAGNOSIS — R1011 Right upper quadrant pain: Secondary | ICD-10-CM

## 2016-09-08 DIAGNOSIS — I10 Essential (primary) hypertension: Secondary | ICD-10-CM | POA: Diagnosis present

## 2016-09-08 DIAGNOSIS — Z85828 Personal history of other malignant neoplasm of skin: Secondary | ICD-10-CM

## 2016-09-08 DIAGNOSIS — R05 Cough: Secondary | ICD-10-CM | POA: Diagnosis not present

## 2016-09-08 DIAGNOSIS — Z8673 Personal history of transient ischemic attack (TIA), and cerebral infarction without residual deficits: Secondary | ICD-10-CM | POA: Diagnosis not present

## 2016-09-08 DIAGNOSIS — Z79899 Other long term (current) drug therapy: Secondary | ICD-10-CM

## 2016-09-08 DIAGNOSIS — R7989 Other specified abnormal findings of blood chemistry: Secondary | ICD-10-CM | POA: Diagnosis not present

## 2016-09-08 DIAGNOSIS — Z66 Do not resuscitate: Secondary | ICD-10-CM | POA: Diagnosis present

## 2016-09-08 DIAGNOSIS — A0472 Enterocolitis due to Clostridium difficile, not specified as recurrent: Secondary | ICD-10-CM | POA: Diagnosis not present

## 2016-09-08 DIAGNOSIS — E876 Hypokalemia: Secondary | ICD-10-CM | POA: Diagnosis present

## 2016-09-08 DIAGNOSIS — K863 Pseudocyst of pancreas: Secondary | ICD-10-CM | POA: Diagnosis present

## 2016-09-08 DIAGNOSIS — K838 Other specified diseases of biliary tract: Secondary | ICD-10-CM | POA: Diagnosis not present

## 2016-09-08 DIAGNOSIS — M797 Fibromyalgia: Secondary | ICD-10-CM | POA: Diagnosis present

## 2016-09-08 DIAGNOSIS — G8929 Other chronic pain: Secondary | ICD-10-CM | POA: Diagnosis not present

## 2016-09-08 DIAGNOSIS — R197 Diarrhea, unspecified: Secondary | ICD-10-CM | POA: Diagnosis not present

## 2016-09-08 DIAGNOSIS — R945 Abnormal results of liver function studies: Secondary | ICD-10-CM | POA: Diagnosis not present

## 2016-09-08 DIAGNOSIS — F17210 Nicotine dependence, cigarettes, uncomplicated: Secondary | ICD-10-CM | POA: Diagnosis present

## 2016-09-08 DIAGNOSIS — M542 Cervicalgia: Secondary | ICD-10-CM | POA: Diagnosis not present

## 2016-09-08 DIAGNOSIS — K859 Acute pancreatitis without necrosis or infection, unspecified: Secondary | ICD-10-CM

## 2016-09-08 DIAGNOSIS — R52 Pain, unspecified: Secondary | ICD-10-CM | POA: Diagnosis not present

## 2016-09-08 HISTORY — DX: Malignant (primary) neoplasm, unspecified: C80.1

## 2016-09-08 LAB — COMPREHENSIVE METABOLIC PANEL
ALT: 12 U/L — ABNORMAL LOW (ref 14–54)
AST: 15 U/L (ref 15–41)
Albumin: 3.8 g/dL (ref 3.5–5.0)
Alkaline Phosphatase: 102 U/L (ref 38–126)
Anion gap: 5 (ref 5–15)
BUN: 10 mg/dL (ref 6–20)
CO2: 26 mmol/L (ref 22–32)
Calcium: 9.4 mg/dL (ref 8.9–10.3)
Chloride: 111 mmol/L (ref 101–111)
Creatinine, Ser: 0.7 mg/dL (ref 0.44–1.00)
GFR calc Af Amer: >60 mL/min (ref >60)
GFR calc non Af Amer: >60 mL/min (ref >60)
Glucose, Bld: 98 mg/dL (ref 65–99)
Potassium: 3.4 mmol/L — ABNORMAL LOW (ref 3.5–5.1)
Sodium: 142 mmol/L (ref 135–145)
Total Bilirubin: 0.4 mg/dL (ref 0.3–1.2)
Total Protein: 6.1 g/dL — ABNORMAL LOW (ref 6.5–8.1)

## 2016-09-08 LAB — URINALYSIS, ROUTINE W REFLEX MICROSCOPIC
Bilirubin Urine: NEGATIVE
Glucose, UA: NEGATIVE mg/dL
Hgb urine dipstick: NEGATIVE
Ketones, ur: NEGATIVE mg/dL
Leukocytes, UA: NEGATIVE
Nitrite: NEGATIVE
Protein, ur: NEGATIVE mg/dL
Specific Gravity, Urine: 1.04 — ABNORMAL HIGH (ref 1.005–1.030)
pH: 6 (ref 5.0–8.0)

## 2016-09-08 LAB — CBC WITH DIFFERENTIAL/PLATELET
Basophils Absolute: 0 10*3/uL (ref 0.0–0.1)
Basophils Relative: 1 %
Eosinophils Absolute: 0.1 10*3/uL (ref 0.0–0.7)
Eosinophils Relative: 1 %
HCT: 36.1 % (ref 36.0–46.0)
Hemoglobin: 11.9 g/dL — ABNORMAL LOW (ref 12.0–15.0)
Lymphocytes Relative: 35 %
Lymphs Abs: 3.1 10*3/uL (ref 0.7–4.0)
MCH: 30.5 pg (ref 26.0–34.0)
MCHC: 33 g/dL (ref 30.0–36.0)
MCV: 92.6 fL (ref 78.0–100.0)
Monocytes Absolute: 0.6 10*3/uL (ref 0.1–1.0)
Monocytes Relative: 7 %
Neutro Abs: 4.8 10*3/uL (ref 1.7–7.7)
Neutrophils Relative %: 56 %
Platelets: 375 10*3/uL (ref 150–400)
RBC: 3.9 MIL/uL (ref 3.87–5.11)
RDW: 15.2 % (ref 11.5–15.5)
WBC: 8.6 10*3/uL (ref 4.0–10.5)

## 2016-09-08 LAB — I-STAT CG4 LACTIC ACID, ED: Lactic Acid, Venous: 0.64 mmol/L (ref 0.5–1.9)

## 2016-09-08 LAB — LIPASE, BLOOD: Lipase: 307 U/L — ABNORMAL HIGH (ref 11–51)

## 2016-09-08 LAB — I-STAT TROPONIN, ED: Troponin i, poc: 0 ng/mL (ref 0.00–0.08)

## 2016-09-08 MED ORDER — PANTOPRAZOLE SODIUM 40 MG PO TBEC
40.0000 mg | DELAYED_RELEASE_TABLET | Freq: Every day | ORAL | Status: DC
  Administered 2016-09-09: 40 mg via ORAL
  Filled 2016-09-08 (×2): qty 1

## 2016-09-08 MED ORDER — MORPHINE SULFATE (PF) 2 MG/ML IV SOLN
2.0000 mg | INTRAVENOUS | Status: DC | PRN
  Administered 2016-09-09 – 2016-09-10 (×6): 2 mg via INTRAVENOUS
  Filled 2016-09-08 (×6): qty 1

## 2016-09-08 MED ORDER — VANCOMYCIN 50 MG/ML ORAL SOLUTION: 125.0000 mg | ORAL | Status: DC

## 2016-09-08 MED ORDER — VANCOMYCIN 50 MG/ML ORAL SOLUTION: 125.0000 mg | Freq: Two times a day (BID) | ORAL | Status: DC

## 2016-09-08 MED ORDER — PREGABALIN 50 MG PO CAPS
50.0000 mg | ORAL_CAPSULE | Freq: Two times a day (BID) | ORAL | Status: DC
  Administered 2016-09-08 – 2016-09-11 (×6): 50 mg via ORAL
  Filled 2016-09-08 (×6): qty 1

## 2016-09-08 MED ORDER — IOPAMIDOL (ISOVUE-300) INJECTION 61%
INTRAVENOUS | Status: AC
  Administered 2016-09-08: 100 mL
  Filled 2016-09-08: qty 100

## 2016-09-08 MED ORDER — MORPHINE SULFATE (PF) 4 MG/ML IV SOLN
4.0000 mg | Freq: Once | INTRAVENOUS | Status: AC
  Administered 2016-09-08: 4 mg via INTRAVENOUS
  Filled 2016-09-08: qty 1

## 2016-09-08 MED ORDER — ACETAMINOPHEN 650 MG RE SUPP: 650.0000 mg | Freq: Four times a day (QID) | RECTAL | Status: DC | PRN

## 2016-09-08 MED ORDER — ONDANSETRON HCL 4 MG/2ML IJ SOLN: 4.0000 mg | Freq: Four times a day (QID) | INTRAMUSCULAR | Status: DC | PRN

## 2016-09-08 MED ORDER — ACETAMINOPHEN 325 MG PO TABS
650.0000 mg | ORAL_TABLET | Freq: Four times a day (QID) | ORAL | Status: DC | PRN
  Filled 2016-09-08: qty 2

## 2016-09-08 MED ORDER — CARISOPRODOL 350 MG PO TABS
350.0000 mg | ORAL_TABLET | Freq: Three times a day (TID) | ORAL | Status: DC
  Administered 2016-09-08 – 2016-09-11 (×8): 350 mg via ORAL
  Filled 2016-09-08 (×8): qty 1

## 2016-09-08 MED ORDER — ONDANSETRON HCL 4 MG/2ML IJ SOLN
4.0000 mg | Freq: Once | INTRAMUSCULAR | Status: AC
  Administered 2016-09-08: 4 mg via INTRAVENOUS
  Filled 2016-09-08: qty 2

## 2016-09-08 MED ORDER — VANCOMYCIN 50 MG/ML ORAL SOLUTION: 125.0000 mg | Freq: Every day | ORAL | Status: DC

## 2016-09-08 MED ORDER — ENOXAPARIN SODIUM 40 MG/0.4ML ~~LOC~~ SOLN
40.0000 mg | SUBCUTANEOUS | Status: DC
  Administered 2016-09-08 – 2016-09-10 (×3): 40 mg via SUBCUTANEOUS
  Filled 2016-09-08 (×3): qty 0.4

## 2016-09-08 MED ORDER — SENNA 8.6 MG PO TABS
1.0000 | ORAL_TABLET | Freq: Two times a day (BID) | ORAL | Status: DC
  Administered 2016-09-08 – 2016-09-09 (×2): 8.6 mg via ORAL
  Filled 2016-09-08 (×3): qty 1

## 2016-09-08 MED ORDER — CARVEDILOL 6.25 MG PO TABS
6.2500 mg | ORAL_TABLET | Freq: Two times a day (BID) | ORAL | Status: DC
  Administered 2016-09-08 – 2016-09-11 (×6): 6.25 mg via ORAL
  Filled 2016-09-08 (×6): qty 1

## 2016-09-08 MED ORDER — VANCOMYCIN 50 MG/ML ORAL SOLUTION
125.0000 mg | Freq: Four times a day (QID) | ORAL | Status: DC
  Filled 2016-09-08 (×9): qty 2.5

## 2016-09-08 MED ORDER — POTASSIUM CHLORIDE CRYS ER 20 MEQ PO TBCR
40.0000 meq | EXTENDED_RELEASE_TABLET | Freq: Two times a day (BID) | ORAL | Status: AC
  Administered 2016-09-08 – 2016-09-09 (×2): 40 meq via ORAL
  Filled 2016-09-08 (×2): qty 2

## 2016-09-08 MED ORDER — SODIUM CHLORIDE 0.9 % IV SOLN
INTRAVENOUS | Status: AC
  Administered 2016-09-08 – 2016-09-09 (×2): via INTRAVENOUS

## 2016-09-08 MED ORDER — ONDANSETRON HCL 4 MG PO TABS
4.0000 mg | ORAL_TABLET | Freq: Four times a day (QID) | ORAL | Status: DC | PRN
Start: 2016-09-08 — End: 2016-09-11

## 2016-09-08 NOTE — ED Triage Notes (Signed)
Per EMS- pt here for evaluation of worsening neck pain, pt unable to look up. PT also has pain to posterior head. Pt also reports C-diff colitis diagnosis. Pt states she has been on 8 antibiotics. Pt states 3 episodes of diarrhea this morning. Pt reports fever chills at home. 99.1 oral. Pt moved into a negative pressure room and has a mask on. Pt has a productive cough with yellow sputum. Pt also reports weakness.

## 2016-09-08 NOTE — ED Notes (Signed)
Patient aware that urine and stool sample are needed

## 2016-09-08 NOTE — Progress Notes (Signed)
Patient arrived on unit via stretcher from ED.  No family at bedside.  

## 2016-09-08 NOTE — ED Notes (Signed)
Please have the pt call her husband

## 2016-09-08 NOTE — ED Provider Notes (Signed)
Emergency Department Provider Note   I have reviewed the triage vital signs and the nursing notes.   HISTORY  Chief Complaint RUQ abdominal pain  HPI [name] is a 66 y.o. 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 Diff infection presents to the emergency department for evaluation of continued occipital headache and neck pain along with severe diarrhea and generalized abdominal discomfort. Patient states that her head and neck pain is unchanged since February 2018 when she was diagnosed with bacterial meningitis. She had multiple complications from this but was treated with antibiotics and ultimately recovered. She notes constant pain in her head and neck with associated stiffness since that diagnosis. She denies any significant worsening in her head and neck pain. She denies fevers but occasionally will have shaking chills. The pain radiates from the neck and back of the head to the right arm. She feels worse when she is looking up and has trouble walking because of stiffness. She's been referred to an orthopedist for further evaluation of her neck discomfort but was told she cannot be evaluated until she was "cleared of meningitis" by a Neurologist. She has an appointment scheduled with Neurology but has not seen them yet.   Patient is also concerned about her continued diarrhea and diffuse abdominal pain. She states this is the primary reason for her presenting to the emergency department today. She has completed 2 separate oral vancomycin treatments as an outpatient. Her treatments last proximally 2 weeks. She completed her last course of antibiotics one week prior but continues to have multiple episodes of nonbloody diarrhea daily. She denies any improvement with either vancomycin course. He abdominal pain today is primarily right sided.    Past Medical History:  Diagnosis Date  .  Arrhythmia   . Arthritis   . Cancer ([place])   . Fibromyalgia   . GERD (gastroesophageal reflux disease)   . H/O: substance abuse 2005   narcotic usage due to chronic back pain  . Heart murmur   . Hypertension   . Meningitis   . PONV (postoperative nausea and vomiting)     Patient Active Problem List   Diagnosis Date Noted  . Abdominal pain 09/08/2016  . Acute encephalopathy   . Fever   . Left hemiparesis ([place])   . Generalized OA   . Fibromyalgia   . Tobacco abuse   . Substance abuse   . Benign essential HTN   . Tachycardia   . Chronic pain syndrome   . Acute blood loss anemia   . Leukocytosis   . Septic thrombophlebitis of sagittal sinus   . Acute respiratory failure ([place])   . Streptococcus pneumoniae meningitis   . Streptococcal bacteremia   . Meningitis   . Cerebral embolism with cerebral infarction 04/12/2016  . Stroke (cerebrum) ([place]) 04/12/2016  . Neck pain 04/04/2016  . Chronic abdominal pain 02/19/2016  . Essential hypertension 02/19/2016  . Bacterial conjunctivitis 02/19/2016  . Health care maintenance 02/19/2016  . ANXIETY 05/08/2006  . TOBACCO DEPENDENCE 05/08/2006  . ASTHMA, UNSPECIFIED 05/08/2006  . GASTROESOPHAGEAL REFLUX, NO ESOPHAGITIS 05/08/2006  . CONSTIPATION 05/08/2006  . CONVULSIONS, SEIZURES, NOS 05/08/2006    Past Surgical History:  Procedure Laterality Date  . ABDOMINAL HYSTERECTOMY    . ABDOMINAL SURGERY    . APPENDECTOMY    . BREAST SURGERY    . CHOLECYSTECTOMY N/A 09/30/2012   Procedure: LAPAROSCOPIC CHOLECYSTECTOMY WITH INTRAOPERATIVE CHOLANGIOGRAM;  Surgeon: Imogene Burn. Georgette Dover, MD;  Location: [facility];  Service: General;  Laterality: N/A;  . FRACTURE SURGERY     right upper arm  . OVARIAN CYST SURGERY        Allergies Codeine and Nsaids  Family History  Problem Relation Age of Onset  . Alzheimer's disease Mother   . Cancer Father        prostate  . Heart disease Father     Social History Social History  Substance Use  Topics  . Smoking status: Former Smoker    Packs/day: 1.00    Years: 30.00    Types: Cigarettes  . Smokeless tobacco: Never Used  . Alcohol use No    Review of Systems  Constitutional: No fever/chills Eyes: No visual changes. ENT: No sore throat. Cardiovascular: Positive right sided chest pain. Respiratory: Denies shortness of breath. Gastrointestinal: Positive RUQ abdominal pain.  No nausea, no vomiting.  Positive diarrhea.  No constipation. Genitourinary: Negative for dysuria. Musculoskeletal: Negative for back pain. Positive neck pain.  Skin: Negative for rash. Neurological: Negative for focal weakness or numbness. Positive posterior HA.  10-point ROS otherwise negative.  ____________________________________________   PHYSICAL EXAM:  VITAL SIGNS: ED Triage Vitals  Enc Vitals Group     BP 09/08/16 1208 (!) 151/85     Pulse Rate 09/08/16 1209 86     Resp 09/08/16 1232 20     Temp 09/08/16 1222 99.1 F (37.3 C)     Temp Source 09/08/16 1222 Oral     SpO2 09/08/16 1209 99 %     Weight 09/08/16 1222 110 lb (49.9 kg)     Height 09/08/16 1222 5\' 4"  (1.626 m)     Pain Score 09/08/16 1222 10   Constitutional: Alert and oriented. Well appearing and in no acute distress. Eyes: Conjunctivae are normal.  Head: Atraumatic. Nose: No congestion/rhinnorhea. Mouth/Throat: Mucous membranes are dry.  Neck: No stridor.  No meningeal signs.  Cardiovascular: Normal rate, regular rhythm. Good peripheral circulation. Grossly normal heart sounds.   Respiratory: Normal respiratory effort.  No retractions. Lungs CTAB. Gastrointestinal: Soft with focal RUQ tenderness. No distention.  Musculoskeletal: No lower extremity tenderness nor edema. No gross deformities of extremities. Neurologic:  Normal speech and language. No gross focal neurologic deficits are appreciated.  Skin:  Skin is warm, dry and intact. No rash noted.  ____________________________________________   LABS (all labs  ordered are listed, but only abnormal results are displayed)  Labs Reviewed  COMPREHENSIVE METABOLIC PANEL - Abnormal; Notable for the following:       Result Value   Potassium 3.4 (*)    Total Protein 6.1 (*)    ALT 12 (*)    All other components within normal limits  CBC WITH DIFFERENTIAL/PLATELET - Abnormal; Notable for the following:    Hemoglobin 11.9 (*)    All other components within normal limits  STOOL CULTURE  URINALYSIS, ROUTINE W REFLEX MICROSCOPIC  COMPREHENSIVE METABOLIC PANEL  CBC  I-STAT CG4 LACTIC ACID, ED  I-STAT TROPOININ, ED   __________________________________________  RADIOLOGY  Ct Abdomen Pelvis W Contrast  Result Date: 09/08/2016 CLINICAL DATA:  Abdominal pain, diarrhea EXAM: CT ABDOMEN AND PELVIS WITH CONTRAST TECHNIQUE: Multidetector CT imaging of the abdomen and pelvis was performed using the standard protocol following bolus administration of intravenous contrast. CONTRAST:  155mL ISOVUE-300 IOPAMIDOL (ISOVUE-300) INJECTION 61% COMPARISON:  07/13/2016 FINDINGS: Lower chest: Lung bases are clear. No effusions. Heart is normal size. Hepatobiliary: There is intrahepatic and extrahepatic biliary ductal  dilatation. Common bile duct measures 10 mm. Prior cholecystectomy. Pancreas: Mild pancreatic ductal dilatation. Two small cysts within the pancreas, with a 7 mm cyst in the pancreatic head and 6 mm cyst in the pancreatic body. These are unchanged since recent study. Spleen: No focal abnormality or ductal dilatation. Adrenals/Urinary Tract: Bilateral renal cysts again noted, stable. No hydronephrosis. Adrenal glands and urinary bladder unremarkable. Stomach/Bowel: Stomach, large and small bowel grossly unremarkable. Vascular/Lymphatic: Scattered aortic calcifications. No aneurysm or adenopathy. Reproductive: Prior hysterectomy.  No adnexal masses. Other: No free fluid or free air. Musculoskeletal: No acute bony abnormality. Degenerative disc disease at L4-5. IMPRESSION:  Intrahepatic and extrahepatic biliary ductal dilatation. Pancreatic duct slightly prominent. There are no visible obstructing masses or biliary ductal stones. Small cysts noted in the pancreatic head and body. If further evaluation is felt warranted, MRCP may be beneficial. Bilateral renal cysts.  No hydronephrosis. Electronically Signed   By: Rolm Baptise M.D.   On: 09/08/2016 14:50   Dg Chest Portable 1 View  Result Date: 09/08/2016 CLINICAL DATA:  Worsening neck pain, head pain, diarrhea, fever, chills, productive cough with yellow sputum, weakness, history hypertension, fibromyalgia, GERD, former smoker EXAM: PORTABLE CHEST 1 VIEW COMPARISON:  Portable exam 1232 hours compared to 06/22/2016 FINDINGS: Normal heart size, mediastinal contours, and pulmonary vascularity. Atherosclerotic calcification aorta. Lungs hyperinflated with minimal peribronchial thickening. No pulmonary infiltrate, pleural effusion or pneumothorax. Bones appear demineralized. IMPRESSION: No acute abnormalities. Aortic Atherosclerosis (ICD10-I70.0) and Emphysema (ICD10-J43.9). Electronically Signed   By: Lavonia Dana M.D.   On: 09/08/2016 12:58    ____________________________________________   PROCEDURES  Procedure(s) performed:   Procedures  None ____________________________________________   INITIAL IMPRESSION / ASSESSMENT AND PLAN / ED COURSE  Pertinent labs & imaging results that were available during my care of the patient were reviewed by me and considered in my medical decision making (see chart for details).  Patient resents to the emergency department for evaluation of multiple complaints including continued had neck stiffness along with abdominal discomfort and diarrhea. In terms of the neck stiffness she has had pain since bacterial meningitis diagnosis in February 2018. Her pain is not significantly worsened. She is not having fever. She reports occasional chills. I have extremely low suspicion for return of  bacterial meningitis in this clinical scenario given unchanged symptoms and prolonged course. She has no neurological deficits to prompt an emergent MRI of the cervical spine or additional spine imaging at this time.   In terms of her continued diarrhea and abdominal pain the patient has completed 2 oral Vancomycin courses for C. Difficile with no improvement in symptoms. Her abdominal pain is mainly right-sided. She does have some mild to moderate tenderness in this area. Plan to obtain labs, chest x-ray, CT scan of the abdomen and pelvis with abdominal pain and reevaluated afterwards. I did call to discuss the case with ID Dr. Dietrich [place] who reviewed the patient's treatment history and clinical presentation with me by phone. He recommends additional oral vancomycin treatment with prolonged taper lasting approximately 6 weeks in total. Discussed that patient may also benefit from discussion regarding fecal transplant if this does not work.   03:20 PM After review of pharmacy records the patient completed a two-week course of vancomycin recently but did not pick up the medication for her tapered dose.   Discussed patient's case with Family Medicine. Patient and family (if present) updated with plan. Care transferred to [facility] Medicine service.  I reviewed all nursing notes, vitals, pertinent  old records, EKGs, labs, imaging (as available).  ____________________________________________  FINAL CLINICAL IMPRESSION(S) / ED DIAGNOSES  Final diagnoses:  Right upper quadrant abdominal pain  Diarrhea, unspecified type  Neck pain, chronic     MEDICATIONS GIVEN DURING THIS VISIT:  Medications  vancomycin (VANCOCIN) 50 mg/mL oral solution 125 mg (125 mg Oral Not Given 09/08/16 1800)    Followed by  vancomycin (VANCOCIN) 50 mg/mL oral solution 125 mg (not administered)    Followed by  vancomycin (VANCOCIN) 50 mg/mL oral solution 125 mg (not administered)    Followed by  vancomycin (VANCOCIN) 50  mg/mL oral solution 125 mg (not administered)    Followed by  vancomycin (VANCOCIN) 50 mg/mL oral solution 125 mg (not administered)  pantoprazole (PROTONIX) EC tablet 40 mg (not administered)  carisoprodol (SOMA) tablet 350 mg (not administered)  carvedilol (COREG) tablet 6.25 mg (6.25 mg Oral Given 09/08/16 1902)  pregabalin (LYRICA) capsule 50 mg (not administered)  enoxaparin (LOVENOX) injection 40 mg (40 mg Subcutaneous Given 09/08/16 1901)  acetaminophen (TYLENOL) tablet 650 mg (not administered)    Or  acetaminophen (TYLENOL) suppository 650 mg (not administered)  senna (SENOKOT) tablet 8.6 mg (not administered)  ondansetron (ZOFRAN) tablet 4 mg (not administered)    Or  ondansetron (ZOFRAN) injection 4 mg (not administered)  0.9 %  sodium chloride infusion ( Intravenous New Bag/Given 09/08/16 1859)  potassium chloride SA (K-DUR,KLOR-CON) CR tablet 40 mEq (not administered)  morphine 4 MG/ML injection 4 mg (4 mg Intravenous Given 09/08/16 1305)  ondansetron (ZOFRAN) injection 4 mg (4 mg Intravenous Given 09/08/16 1305)  iopamidol (ISOVUE-300) 61 % injection (100 mLs  Contrast Given 09/08/16 1426)  morphine 4 MG/ML injection 4 mg (4 mg Intravenous Given 09/08/16 1610)  morphine 4 MG/ML injection 4 mg (4 mg Intravenous Given 09/08/16 1900)     NEW OUTPATIENT MEDICATIONS STARTED DURING THIS VISIT:  None   Note:  This document was prepared using Dragon voice recognition software and may include unintentional dictation errors.  Nanda Quinton, MD Emergency Medicine    Yaser Harvill, [facility], MD 09/08/16 [phone]

## 2016-09-08 NOTE — ED Notes (Signed)
EDP Dr. Laverta [place] still adv ising to hold vancomycin until stool culture obtained

## 2016-09-08 NOTE — H&P (Signed)
[facility] Admission History and Physical Service Pager: [phone]  Patient name: [name] Medical record number: [id number] Date of birth: 03-May-1950 Age: 66 y.o. Gender: female  Primary Care Provider: [name], MD Consultants: ID, GI Code Status: DNR   Chief Complaint: Abdominal pain, headache   Assessment and Plan: Quenna Doepke is a 66 y.o. female presenting with abdominal pain and diarrhea.  PMH is significant for fibromyalgia, HTN, pneumococcal meningitis in 04/2016 with subsequent CVA and seizure requiring multiple antibiotics and resulting c.diff infection.    Abdominal pain, nausea/vomiting   On arrival to ED, complaining of RUQ pain.  Patient afebrile with stable vital signs, no white count and no lactic acidosis.  Labs notable for K+ of 3.4 but otherwise unremarkable.  CMP with LFTs within normal limits with AST 15 and ALT 12.  Lipase 307.  Initial point of care troponin negative and CXR without acute abnormalities.  CT A/P with intrahepatic and extrahepatic biliary ductal dilatation, and pancreatic duct slightly prominent. However no visible obstructing masses or biliary ductal stones. Small cysts noted in the pancreatic head and body.  Given extrahepatic and intrahepatic biliary duct dilation, MRCP may be warranted for further evaluation and GI consulted in ED.  Also consider pancreatitis given elevated lipase, generalized epigastric pain and nausea.  Of note, prior history of abdominal surgeries including laparoscopic cholecystectomy in 2014, appendectomy, abdominal hysterectomy and ovarian cyst surgery.  Pain mainly localized to RUQ and patient denies urinary symptoms or pelvic pain.  -Admit to [facility], attending Dr. Erin Hearing  -Follow up GI recommendations; may want to do MRCP  -NPO for now given elevated lipase would treat as pancreatitis  -Tylenol 650 mg Q6 PRN  -Zofran 4 mg Q6 PRN  -s/p Morphine 4mg  x2 given in ED  -Continue pain  control with morphine 2 mg Q2 PRN -IV protonix - IV NS @ 150 cc/hr  -UA pending  -I's and O's  -AM CBC and CMET  -vitals per unit routine   Headache/Neck, chronic pain.  Continued occipital headache that is unchanged without significant worsening since 04/2016 since diagnosed with bacterial meningitis.  Associated with stiffness and sometimes radiation to right arm.  Denies fevers but endorses occasional chills.   Otherwise alert with no acute changes. Discussed with ID Dr. Tommy Medal in ED and unlikely meningitis given no other symptoms.  May consider some component of ?muscle spasm.  Plans to follow outpatient with Orthopedics once she is "cleared" of meningitis by a Neurologist.  Per chart review, has an appointment scheduled on 09/25/2016 with Dr. Jaynee Eagles at [facility].  - Continue home Carisoprodol (Soma) 350 mg TID  - Discuss with ID in AM re: thoughts on head/neck pain  - Pain control  -Continue to monitor   Diarrhea, h/o prior incompletely treated C. Diff colitis.  Admitted in 04/2016 for bacterial meningitis and subsequently developed C. difficil colitis from course of antibiotics.  Had been prescribed two courses of Vancomycin at the time and has completed these.  However,  per pharmacy, did not pick up the taper course and therefore was insufficiently treated.  Currently reports no blood in stool although she does note her stools are loose and watery. ID consulted in ED and recommended additional oral vancomycin treatment with prolonged taper lasting approximately 6 weeks in total. Discussed that patient may also benefit from discussion regarding fecal transplant if this does not work.   - Per ID recs >> will start Vancomycin taper as follows :                    -  125 mg po Vancocin 4x daily x 14 days                   -125 mg po Vancocin 2x daily x 7 days                   -125 mg po Vancocin daily x 7 days                   -125 mg po Vancocin every other day x 7 days                    -125 mg po Vancocin every 3 days x 14 days  -Note: HOLD Vancomycin until stool sample collected -Will follow up stool cx  -Enteric precautions  -IV fluids: NS @80cc /hr x12 hours for hydration   HTN.  At home on Carvedilol 6.25 mg BID.  Took her AM dose prior to coming to ED.  -Continue home Coreg   Hypokalemia.  On admission with K+ 3.4 -Give Kdur 40 mEq x2 -Daily BMET  H/o L-sided stroke following bacterial meningitis in Feb 2018. Stable with no new changes.  With residual left sided weakness, sensation intact.   -PT/OT recs  H/o skin cancer over nasal bridge  -Planned for repeat biopsy once colitis resolves  -Follows with Dermatology at [place], [place].   Fibromyalgia. Stable.  At home on Soma and Lyrica.  -Continue home Soma 350 mg TID -Continue home Lyrica 50 mg BID   Tobacco use. Current smoker, endorses 1/2 PPD -Transdermal Nicotine patch offered however pt declines at this time    FEN/GI: IV NS @100cc /hr x 12h, NPO sips with meds  Prophylaxis: Lovenox, Protonix   Disposition: Admit to [facility], attending Dr. Erin Hearing   History of Present Illness:  [name] is a 66 y.o. female presenting with abdominal pain.     Has been having severe pain under right side of ribs x last 2 weeks. Can't find a comfortable position to sleep in.  Has been taking Tylenol at home for it.  Has been up and walking around but not able to get a lot done during the day due to intermittent pain.  Feels better when she sits down.  Denies chest pain and leg swelling.   Endorses shortness of breath every morning. Takes a few hours of moving around for it to go away but it resolves over the course of the day.  No recent falls or injury.  N/V x past few weeks as well, she is unsure when it started exactly.  Has been drinking ginger ale and tries to eat throughout the day.    Was hospitalized for bacterial meningitis a couple months ago in February. Subsequently had seizures and a left-sided  stroke at that time.  Is not taking blood thinners currently.  Has been having diarrhea and diffuse abdominal pain. Bowel movements are not bloody or painful.   Has not taken the taper of antibiotics that was prescribed to her.   Also endorses headache and neck pain/stiffness.  States this is unchanged and has been constant since March. Headache is located occipitally.  Denies fevers at home but has had chills.   Review Of Systems: Per HPI with the following additions:  Review of Systems  Constitutional: Positive for chills. Negative for fever.  HENT: Negative for congestion and sore throat.   Eyes: Negative for blurred vision and pain.  Respiratory: Positive for shortness of breath.   Cardiovascular: Negative  for chest pain and leg swelling.  Gastrointestinal: Positive for abdominal pain, diarrhea, nausea and vomiting. Negative for blood in stool, constipation and heartburn.  Genitourinary: Negative for dysuria.  Musculoskeletal: Positive for neck pain. Negative for back pain and falls.  Skin: Negative for itching and rash.  Neurological: Positive for weakness.   Patient Active Problem List   Diagnosis Date Noted  . Abdominal pain 09/08/2016  . Acute encephalopathy   . Fever   . Left hemiparesis ([place])   . Generalized OA   . Fibromyalgia   . Tobacco abuse   . Substance abuse   . Benign essential HTN   . Tachycardia   . Chronic pain syndrome   . Acute blood loss anemia   . Leukocytosis   . Septic thrombophlebitis of sagittal sinus   . Acute respiratory failure ([place])   . Streptococcus pneumoniae meningitis   . Streptococcal bacteremia   . Meningitis   . Cerebral embolism with cerebral infarction 04/12/2016  . Stroke (cerebrum) ([place]) 04/12/2016  . Neck pain 04/04/2016  . Chronic abdominal pain 02/19/2016  . Essential hypertension 02/19/2016  . Bacterial conjunctivitis 02/19/2016  . Health care maintenance 02/19/2016  . ANXIETY 05/08/2006  . TOBACCO DEPENDENCE 05/08/2006  .  ASTHMA, UNSPECIFIED 05/08/2006  . GASTROESOPHAGEAL REFLUX, NO ESOPHAGITIS 05/08/2006  . CONSTIPATION 05/08/2006  . CONVULSIONS, SEIZURES, NOS 05/08/2006   Past Medical History: Past Medical History:  Diagnosis Date  . Arrhythmia   . Arthritis   . Cancer ([place])   . Fibromyalgia   . GERD (gastroesophageal reflux disease)   . H/O: substance abuse 2005   narcotic usage due to chronic back pain  . Heart murmur   . Hypertension   . Meningitis   . PONV (postoperative nausea and vomiting)    Past Surgical History: Past Surgical History:  Procedure Laterality Date  . ABDOMINAL HYSTERECTOMY    . ABDOMINAL SURGERY    . APPENDECTOMY    . BREAST SURGERY    . CHOLECYSTECTOMY N/A 09/30/2012   Procedure: LAPAROSCOPIC CHOLECYSTECTOMY WITH INTRAOPERATIVE CHOLANGIOGRAM;  Surgeon: Imogene Burn. Georgette Dover, MD;  Location: [facility]S;  Service: General;  Laterality: N/A;  . FRACTURE SURGERY     right upper arm  . OVARIAN CYST SURGERY     Social History: Social History  Substance Use Topics  . Smoking status: Former Smoker    Packs/day: 1.00    Years: 30.00    Types: Cigarettes  . Smokeless tobacco: Never Used  . Alcohol use No   Additional social history: Lives at home with husband.  Smoker, 1/2 PPD.  Denies alcohol use. Denies drug use.  Ambulates with cane.    Please also refer to relevant sections of EMR.  Family History: Family History  Problem Relation Age of Onset  . Alzheimer's disease Mother   . Cancer Father        prostate  . Heart disease Father    Allergies and Medications: Allergies  Allergen Reactions  . Codeine Nausea And Vomiting  . Nsaids Other (See Comments)    Stomach ulcers   No current facility-administered medications on file prior to encounter.    Current Outpatient Prescriptions on File Prior to Encounter  Medication Sig Dispense Refill  . carisoprodol (SOMA) 350 MG tablet Take 1 tablet (350 mg total) by mouth 3 (three) times daily as needed. for muscle spams  Refill only until pt sees neuro 09/25/2016 (Patient taking differently: Take 350 mg by mouth 3 (three) times daily. for muscle spams Refill  only until pt sees neuro 09/25/2016) 30 tablet 0  . carvedilol (COREG) 6.25 MG tablet Take 1 tablet (6.25 mg total) by mouth 2 (two) times daily with a meal. 60 tablet 3  . pregabalin (LYRICA) 50 MG capsule Take 50 mg by mouth 2 (two) times daily.     Marland Kitchen albuterol (PROVENTIL HFA;VENTOLIN HFA) 108 (90 Base) MCG/ACT inhaler Inhale 1-2 puffs into the lungs every 4 (four) hours as needed for wheezing or shortness of breath. (Patient not taking: Reported on 09/08/2016) 1 Inhaler 0  . fluticasone (FLOVENT HFA) 44 MCG/ACT inhaler Inhale 2 puffs into the lungs 2 (two) times daily. (Patient not taking: Reported on 09/08/2016) 1 Inhaler 3  . vancomycin (VANCOCIN) 125 MG capsule 1 po BID for 2 weeks, then 1 po QD for 1 week, then 1 po QOD for 1 month. (Patient taking differently: Take 125 mg by mouth See admin instructions. Ordered 08/17/16: take 1 capsule (125 mg) by mouth twice daily for 2 weeks, then take 1 capsule (125 mg) daily for 1 week, then take 1 capsule (125 mg) every other day for 1 month, then stop) 50 capsule 0   Objective: BP (!) 111/59 (BP Location: Left Arm)   Pulse 79   Temp 98.1 F (36.7 C) (Oral)   Resp 17   Ht 5\' 4"  (1.626 m)   Wt 110 lb 3.7 oz (50 kg)   SpO2 95%   BMI 18.92 kg/m  Exam: General: pleasant 66 yo F, appears comfortable, in no acute distress  Eyes: Right eye pupil round and reactive, h/o left eye paralysis, EOMI  ENTM: nares patent, tacky mucous membranes, o/p clear  Neck: supple Cardiovascular: RRR no MRG, palpable pulses  Respiratory: CTAB no wheezes or crackles appreciated  Gastrointestinal: soft, +TTP over RUQ, no rebound or guarding present, no masses, +bs MSK: no tenderness over extremities  Derm: warm, dry, no rashes  Neuro: AOx4, L eye paralysis, grip strength 4/5 bilaterally, LLE 3/5 strength, RLE 5/5 strength, sensation intact   Psych: normal affect and mood   Labs and Imaging: CBC BMET   Recent Labs Lab 09/08/16 1231  WBC 8.6  HGB 11.9*  HCT 36.1  PLT 375    Recent Labs Lab 09/08/16 1231  NA 142  K 3.4*  CL 111  CO2 26  BUN 10  CREATININE 0.70  GLUCOSE 98  CALCIUM 9.4     Ct Abdomen Pelvis W Contrast  Result Date: 09/08/2016 CLINICAL DATA:  Abdominal pain, diarrhea EXAM: CT ABDOMEN AND PELVIS WITH CONTRAST TECHNIQUE: Multidetector CT imaging of the abdomen and pelvis was performed using the standard protocol following bolus administration of intravenous contrast. CONTRAST:  12mL ISOVUE-300 IOPAMIDOL (ISOVUE-300) INJECTION 61% COMPARISON:  07/13/2016 FINDINGS: Lower chest: Lung bases are clear. No effusions. Heart is normal size. Hepatobiliary: There is intrahepatic and extrahepatic biliary ductal dilatation. Common bile duct measures 10 mm. Prior cholecystectomy. Pancreas: Mild pancreatic ductal dilatation. Two small cysts within the pancreas, with a 7 mm cyst in the pancreatic head and 6 mm cyst in the pancreatic body. These are unchanged since recent study. Spleen: No focal abnormality or ductal dilatation. Adrenals/Urinary Tract: Bilateral renal cysts again noted, stable. No hydronephrosis. Adrenal glands and urinary bladder unremarkable. Stomach/Bowel: Stomach, large and small bowel grossly unremarkable. Vascular/Lymphatic: Scattered aortic calcifications. No aneurysm or adenopathy. Reproductive: Prior hysterectomy.  No adnexal masses. Other: No free fluid or free air. Musculoskeletal: No acute bony abnormality. Degenerative disc disease at L4-5. IMPRESSION: Intrahepatic and extrahepatic biliary ductal dilatation.  Pancreatic duct slightly prominent. There are no visible obstructing masses or biliary ductal stones. Small cysts noted in the pancreatic head and body. If further evaluation is felt warranted, MRCP may be beneficial. Bilateral renal cysts.  No hydronephrosis. Electronically Signed   By: Rolm Baptise M.D.   On: 09/08/2016 14:50   Dg Chest Portable 1 View  Result Date: 09/08/2016 CLINICAL DATA:  Worsening neck pain, head pain, diarrhea, fever, chills, productive cough with yellow sputum, weakness, history hypertension, fibromyalgia, GERD, former smoker EXAM: PORTABLE CHEST 1 VIEW COMPARISON:  Portable exam 1232 hours compared to 06/22/2016 FINDINGS: Normal heart size, mediastinal contours, and pulmonary vascularity. Atherosclerotic calcification aorta. Lungs hyperinflated with minimal peribronchial thickening. No pulmonary infiltrate, pleural effusion or pneumothorax. Bones appear demineralized. IMPRESSION: No acute abnormalities. Aortic Atherosclerosis (ICD10-I70.0) and Emphysema (ICD10-J43.9). Electronically Signed   By: Lavonia Dana M.D.   On: 09/08/2016 12:58    Lovenia Kim, MD 09/08/2016, 10:26 PM PGY-2, [place] Intern pager: [phone], text pages welcome

## 2016-09-08 NOTE — ED Notes (Signed)
Dr. Laverta Baltimore gave verbal order to hold oral vancomycin until stool culture has been sent

## 2016-09-08 NOTE — Progress Notes (Signed)
Spoke with Hoyle Sauer with infection prevention.  Bacterial meningitis is droplet precaution which may be discontinued 24 hours after abx have started.  If patient continues symptoms patient may return to droplet precautions.

## 2016-09-08 NOTE — ED Notes (Signed)
Admit MD at bedside

## 2016-09-09 ENCOUNTER — Inpatient Hospital Stay (HOSPITAL_COMMUNITY): Payer: Medicare Other

## 2016-09-09 DIAGNOSIS — R1011 Right upper quadrant pain: Secondary | ICD-10-CM

## 2016-09-09 DIAGNOSIS — R945 Abnormal results of liver function studies: Secondary | ICD-10-CM

## 2016-09-09 LAB — COMPREHENSIVE METABOLIC PANEL
ALT: 354 U/L — ABNORMAL HIGH (ref 14–54)
AST: 369 U/L — ABNORMAL HIGH (ref 15–41)
Albumin: 3.4 g/dL — ABNORMAL LOW (ref 3.5–5.0)
Alkaline Phosphatase: 167 U/L — ABNORMAL HIGH (ref 38–126)
Anion gap: 5 (ref 5–15)
BUN: 10 mg/dL (ref 6–20)
CO2: 23 mmol/L (ref 22–32)
Calcium: 8.6 mg/dL — ABNORMAL LOW (ref 8.9–10.3)
Chloride: 114 mmol/L — ABNORMAL HIGH (ref 101–111)
Creatinine, Ser: 0.55 mg/dL (ref 0.44–1.00)
GFR calc Af Amer: >60 mL/min (ref >60)
GFR calc non Af Amer: >60 mL/min (ref >60)
Glucose, Bld: 85 mg/dL (ref 65–99)
Potassium: 3.9 mmol/L (ref 3.5–5.1)
Sodium: 142 mmol/L (ref 135–145)
Total Bilirubin: 0.7 mg/dL (ref 0.3–1.2)
Total Protein: 5.6 g/dL — ABNORMAL LOW (ref 6.5–8.1)

## 2016-09-09 LAB — CBC
HCT: 36 % (ref 36.0–46.0)
Hemoglobin: 11.7 g/dL — ABNORMAL LOW (ref 12.0–15.0)
MCH: 30.7 pg (ref 26.0–34.0)
MCHC: 32.5 g/dL (ref 30.0–36.0)
MCV: 94.5 fL (ref 78.0–100.0)
Platelets: 323 10*3/uL (ref 150–400)
RBC: 3.81 MIL/uL — ABNORMAL LOW (ref 3.87–5.11)
RDW: 15.6 % — ABNORMAL HIGH (ref 11.5–15.5)
WBC: 5 10*3/uL (ref 4.0–10.5)

## 2016-09-09 MED ORDER — FAMOTIDINE IN NACL 20-0.9 MG/50ML-% IV SOLN
20.0000 mg | Freq: Two times a day (BID) | INTRAVENOUS | Status: DC
  Administered 2016-09-09: 20 mg via INTRAVENOUS
  Filled 2016-09-09 (×2): qty 50

## 2016-09-09 MED ORDER — GADOBENATE DIMEGLUMINE 529 MG/ML IV SOLN
20.0000 mL | Freq: Once | INTRAVENOUS | Status: AC
  Administered 2016-09-09: 11 mL via INTRAVENOUS

## 2016-09-09 MED ORDER — HYDROCODONE-ACETAMINOPHEN 7.5-325 MG PO TABS
1.0000 | ORAL_TABLET | Freq: Four times a day (QID) | ORAL | Status: DC | PRN
  Administered 2016-09-09: 1 via ORAL
  Filled 2016-09-09: qty 1

## 2016-09-09 NOTE — Progress Notes (Signed)
PT Cancellation and Discharge Note  Patient Details Name: [name] MRN: [id number] DOB: May 07, 1950   Cancelled Treatment:    Reason Eval/Treat Not Completed: PT screened, no needs identified, will sign off   Discussed pt with Brynn, OT; managing well in room; No acute PT needs;  Consider Outpt PT follow up for neck pain and stiffness;   Thanks,  Roney [place], [place]  Acute Rehabilitation Services Pager [phone] Office [phone]    Colletta [place] 09/09/2016, 3:43 PM

## 2016-09-09 NOTE — Progress Notes (Signed)
Family Medicine Progress note  Attempted to PM round on patient who was no in room. Will attempt to come by later.  Guadalupe Dawn M.D. PGY-1 Family medicine residency

## 2016-09-09 NOTE — Progress Notes (Signed)
Initial Nutrition Assessment  DOCUMENTATION CODES:   Not applicable  INTERVENTION:    Diet advancement as GI status allows.  Recommend add Boost Breeze po TID when diet advanced, each supplement provides 250 kcal and 9 grams of protein  NUTRITION DIAGNOSIS:   Inadequate oral intake related to altered GI function, inability to eat as evidenced by NPO status.  GOAL:   Patient will meet greater than or equal to 90% of their needs  MONITOR:   Diet advancement, PO intake, Labs, I & O's  REASON FOR ASSESSMENT:   Malnutrition Screening Tool   ASSESSMENT:   66 yo female with PMH of fibromyalgia, HTN, narcotics abuse who was admitted on 7/1 with RUQ pain and nausea for 2 weeks. Recently treated for C. Diff.   For MRI with MRCP today. Patient not in room. Unable to complete Nutrition-Focused physical exam at this time.  Chart reviewed.  Weight is down from 124 lbs in the past 6 months, 11% weight loss within 6 months is significant. Suspect severe PCM.  Labs and medications reviewed.  Diet Order:  Diet NPO time specified Except for: Sips with Meds  Skin:  Reviewed, no issues  Last BM:  7/1  Height:   Ht Readings from Last 1 Encounters:  09/08/16 5\' 4"  (1.626 m)    Weight:   Wt Readings from Last 1 Encounters:  09/08/16 110 lb 3.7 oz (50 kg)   Wt Readings from Last 25 Encounters:  09/08/16 110 lb 3.7 oz (50 kg)  08/21/16 114 lb (51.7 kg)  08/17/16 113 lb (51.3 kg)  08/10/16 110 lb 2 oz (50 kg)  08/08/16 109 lb 6.4 oz (49.6 kg)  08/04/16 111 lb 1 oz (50.4 kg)  07/19/16 111 lb 6.4 oz (50.5 kg)  07/15/16 114 lb (51.7 kg)  07/12/16 114 lb (51.7 kg)  06/18/16 108 lb (49 kg)  06/18/16 108 lb (49 kg)  04/12/16 127 lb (57.6 kg)  04/04/16 121 lb (54.9 kg)  03/31/16 124 lb (56.2 kg)  02/19/16 124 lb (56.2 kg)  08/30/14 110 lb (49.9 kg)  09/24/12 124 lb (56.2 kg)  09/14/12 121 lb 9.6 oz (55.2 kg)  09/01/12 120 lb 9.6 oz (54.7 kg)   Ideal Body Weight:  54.5  kg  BMI:  Body mass index is 18.92 kg/m.  Estimated Nutritional Needs:   Kcal:  1500-1700  Protein:  75-85 gm  Fluid:  1.5-1.7 L  EDUCATION NEEDS:   No education needs identified at this time  Molli Barrows, [place], [place], [place] Pager [phone] After Hours Pager [phone]

## 2016-09-09 NOTE — Progress Notes (Signed)
Occupational Therapy Evaluation Patient Details Name: [name] MRN: [id number] DOB: [birth year] Today's Date: 09/09/2016    History of Present Illness 66 yo PMH of Fibromyalgia, HTN, pneumococcal meningitis in 04/2016 with subsequent CVA and seizure requiring multiple abx and resulting c. Diff infection presents to the emergency department for evaluation of continued occipital headache and neck pain along with severe diarrhea and generalized abdominal discomfort   Clinical Impression   Patient evaluated by Occupational Therapy with no further acute OT needs identified. All education has been completed and the patient has no further questions. See below for any follow-up Occupational Therapy or equipment needs. OT to sign off. Thank you for referral.       Follow Up Recommendations  No OT follow up    Equipment Recommendations  None recommended by OT    Recommendations for Other Services       Precautions / Restrictions Precautions Precautions: None      Mobility Bed Mobility Overal bed mobility: Independent                Transfers Overall transfer level: Independent                    Balance                                           ADL either performed or assessed with clinical judgement   ADL Overall ADL's : Independent                                             Vision Baseline Vision/History: Wears glasses Wears Glasses: Reading only Additional Comments: noted to have R eye drift with assessment     Perception     Praxis      Pertinent Vitals/Pain Pain Assessment: 0-10 Pain Score: 8  Pain Location: neck Pain Descriptors / Indicators: Constant Pain Intervention(s): Monitored during session;Premedicated before session;Patient requesting pain meds-RN notified     Hand Dominance Right   Extremity/Trunk Assessment Upper Extremity Assessment Upper Extremity Assessment: Generalized weakness    Lower Extremity Assessment Lower Extremity Assessment: Defer to PT evaluation   Cervical / Trunk Assessment Cervical / Trunk Assessment: Other exceptions (pain in posterior aspect of neck)   Communication Communication Communication: No difficulties   Cognition Arousal/Alertness: Awake/alert Behavior During Therapy: WFL for tasks assessed/performed Overall Cognitive Status: Within Functional Limits for tasks assessed                                     General Comments       Exercises     Shoulder Instructions      Home Living Family/patient expects to be discharged to:: Private residence Living Arrangements: Spouse/significant other Available Help at Discharge: Available PRN/intermittently;Family Type of Home: House Home Access: Stairs to enter [employer] of Steps: 4 Entrance Stairs-Rails: None Home Layout: One level     Bathroom Shower/Tub: Teacher, early years/pre: Standard     Home Equipment: Environmental consultant - 2 wheels;Bedside commode   Additional Comments: husband works days      Prior Functioning/Environment Level of Independence: Independent  OT Problem List:        OT Treatment/Interventions:      OT Goals(Current goals can be found in the care plan section) Acute Rehab OT Goals OT Goal Formulation: With patient  OT Frequency:     Barriers to D/C:            Co-evaluation              AM-PAC PT "6 Clicks" Daily Activity     Outcome Measure Help from another person eating meals?: None Help from another person taking care of personal grooming?: None Help from another person toileting, which includes using toliet, bedpan, or urinal?: None Help from another person bathing (including washing, rinsing, drying)?: None Help from another person to put on and taking off regular upper body clothing?: None Help from another person to put on and taking off regular lower body clothing?: None 6  Click Score: 24   End of Session Nurse Communication: Mobility status;Precautions  Activity Tolerance: Patient tolerated treatment well Patient left: in chair;with call bell/phone within reach  OT Visit Diagnosis: Unsteadiness on feet (R26.81)            Pt can be screened by PT at this time with no needs    Time: 6967-8938 OT Time Calculation (min): 18 min Charges:  OT General Charges $OT Visit: 1 Procedure OT Evaluation $OT Eval Low Complexity: 1 Procedure G-Codes:      Jeri Modena   OTR/L Pager: [phone] Office: [phone] .   Parke Poisson B 09/09/2016, 8:21 AM

## 2016-09-09 NOTE — Consult Note (Signed)
[facility] Consult: 11:32 AM 09/09/2016  LOS: 1 day    Referring Provider: Dr Erin Hearing  Primary Care Physician:  [name], MD Primary Gastroenterologist:  Dr. Erlene Senters PA-C.      Reason for Consultation:  RUQ pain and pancreatitis.       HPI: [name] is a 66 y.o. female.  PMH fibromyalgia, narcotics abuse, Htn.  Heart murmer.  Right abdominal pain despite appendectomy and cholecystectomy, hysterectomy.  .    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  Inpt at [facility]. Started Xarelto. Completed 6 weeks abx.   Readmitted in [place] 3/6 - 05/23/16 with weakness, blood loss anemia (Hgb 5.1).  EGD "unremarkable".  Xarelto discontinued.  Plan for outpt colonoscopy. Discharged on BID Protonix. Hgb ultimately rebounded. 3/29 treated with abx, for URI along with prednisone and Lyrica.   Cancelled 4/11 Colonoscopy due to severe abd pain and diarrhea.   07/13/16 CT: distal gastric, prox duodenal, cecal to mid descending colon thickening.  Suggest PUD and colitis.  Bile ducts WNL.     Treated with course of Vancomycin after 07/15/16 C diff + stool.   Second course of 2 weeks Vanco on 5/31 for diarrhea, abd pain but C diff not repeated.  Seen by GI PA 6/13, planned 4 weeks Vanc but no taper until C diff rechecked.  Also planned f/up CT.  At visit pt c/o acute on chronic right abd pain.    Now admitted yesterday with unbearable RUQ pain, weakness.  Tylenol not helping and no other pain meds in use. Nausea but not vomiting for 2 weeks.  Though anorexic, is able to eat ~ 2 meals per day, PO not accelerating pain but weight drop 125# to 110#.  3 watery stools yesterday, none today.  Had been having 7 or 8 per day last week.  No fever , tachycardia or hypotension.    Lipase is 307 (was 35 on 5/27).  T bili not elevated.  Alk phos 176 (was also elevated in 04/2016, but normal in 07/2016).  Transaminases 369/354, had normalized after minimally elevated in 04/2016.     WBCs not elevated.  Stable Hgb 11.7.   09/08/16 CT ab/pelvis: dilated intra and extrahepatic biliary ductal dilatation.  10 mm CBD.  Pancreatic duct slightly prominent. There are no visible obstructing masses or biliary ductal stones. Small cysts noted in the pancreatic head and body unchanged since recent study. Stomach and colon unremarkable.   No ETOH.  Smoked 10 cigs/day.  No family hx GB, pancreatic, intestinal diseases or cancers.   Compliant with 1x daily Protonix and po Vanco.     Past Medical History:  Diagnosis Date  . Arrhythmia   . Arthritis   . Cancer ([place])   . Fibromyalgia   . GERD (gastroesophageal reflux disease)   . H/O: substance abuse 2005   narcotic usage due to chronic back pain  . Heart murmur   . Hypertension   . Meningitis   . PONV (postoperative nausea and vomiting)     Past  Surgical History:  Procedure Laterality Date  . ABDOMINAL HYSTERECTOMY    . ABDOMINAL SURGERY    . APPENDECTOMY    . BREAST SURGERY    . CHOLECYSTECTOMY N/A 09/30/2012   Procedure: LAPAROSCOPIC CHOLECYSTECTOMY WITH INTRAOPERATIVE CHOLANGIOGRAM;  Surgeon: Imogene Burn. Georgette Dover, MD;  Location: [facility];  Service: General;  Laterality: N/A;  . FRACTURE SURGERY     right upper arm  . OVARIAN CYST SURGERY      Prior to Admission medications   Medication Sig Start Date End Date Taking? Authorizing Provider  acetaminophen (TYLENOL) 500 MG tablet Take 1,000 mg by mouth every 6 (six) hours as needed for headache (pain).   Yes [provider]  carisoprodol (SOMA) 350 MG tablet Take 1 tablet (350 mg total) by mouth 3 (three) times daily as needed. for muscle spams Refill only until pt sees neuro 09/25/2016 Patient taking differently: Take 350 mg by mouth 3 (three) times daily. for muscle spams  Refill only until pt sees neuro 09/25/2016 09/05/16  Yes [name], MD  carvedilol (COREG) 6.25 MG tablet Take 1 tablet (6.25 mg total) by mouth 2 (two) times daily with a meal. 08/08/16  Yes [name], MD  pantoprazole (PROTONIX) 40 MG tablet Take 40 mg by mouth daily.   Yes [provider]  pregabalin (LYRICA) 50 MG capsule Take 50 mg by mouth 2 (two) times daily.  06/06/16 06/06/17 Yes [provider]  albuterol (PROVENTIL HFA;VENTOLIN HFA) 108 (90 Base) MCG/ACT inhaler Inhale 1-2 puffs into the lungs every 4 (four) hours as needed for wheezing or shortness of breath. Patient not taking: Reported on 09/08/2016 09/06/16   [name], MD  fluticasone (FLOVENT HFA) 44 MCG/ACT inhaler Inhale 2 puffs into the lungs 2 (two) times daily. Patient not taking: Reported on 09/08/2016 08/09/16   [name], MD  vancomycin (VANCOCIN) 125 MG capsule 1 po BID for 2 weeks, then 1 po QD for 1 week, then 1 po QOD for 1 month. Patient taking differently: Take 125 mg by mouth See admin instructions. Ordered 08/17/16: take 1 capsule (125 mg) by mouth twice daily for 2 weeks, then take 1 capsule (125 mg) daily for 1 week, then take 1 capsule (125 mg) every other day for 1 month, then stop 08/17/16   [name], MD    Scheduled Meds: . carisoprodol  350 mg Oral TID  . carvedilol  6.25 mg Oral BID WC  . enoxaparin (LOVENOX) injection  40 mg Subcutaneous Q24H  . pantoprazole  40 mg Oral Daily  . pregabalin  50 mg Oral BID  . senna  1 tablet Oral BID  . vancomycin  125 mg Oral QID   Followed by  . [START ON 09/22/2016] vancomycin  125 mg Oral BID   Followed by  . [START ON 09/30/2016] vancomycin  125 mg Oral Daily   Followed by  . [START ON 10/07/2016] vancomycin  125 mg Oral QODAY   Followed by  . [START ON 10/15/2016] vancomycin  125 mg Oral Q3 days   Infusions:  PRN Meds: morphine injection, ondansetron **OR** ondansetron (ZOFRAN) IV   Allergies as of 09/08/2016 -  Review Complete 09/08/2016  Allergen Reaction Noted  . Codeine Nausea And Vomiting 08/19/2012  . Nsaids Other (See Comments) 08/19/2012    Family History  Problem Relation Age of Onset  . Alzheimer's disease Mother   . Cancer Father        prostate  . Heart disease  Father     Social History   Social History  . Marital status: Married    Spouse name: N/A  . Number of children: N/A  . Years of education: N/A   Occupational History  . Not on file.   Social History Main Topics  . Smoking status: Former Smoker    Packs/day: 1.00    Years: 30.00    Types: Cigarettes  . Smokeless tobacco: Never Used  . Alcohol use No  . Drug use: No     Comment: Methadone for narcotic use  . Sexual activity: Not on file   Other Topics Concern  . Not on file   Social History Narrative  . No narrative on file    REVIEW OF SYSTEMS: Constitutional:  weakness ENT:  No nose bleeds Pulm:  No SOB or cough CV:  No palpitations, no LE edema.  GU:  + burning with urination.  No hematuria, no frequency GI:  Per HPI Heme:  No unusual bleeding or bruising.    Transfusions:  Only in 05/2016: 3 PRBCs Neuro:  No headaches, no peripheral tingling or numbness Derm:  No itching, no rash or sores.  Endocrine:  No sweats or chills.  No polyuria or dysuria Immunization:  Not queried.   Travel:  None beyond local counties in last few months.    PHYSICAL EXAM: Vital signs in last 24 hours: Vitals:   09/09/16 0443 09/09/16 0845  BP: (!) 131/58 133/70  Pulse: 63 70  Resp: 18 18  Temp: 97.8 F (36.6 C) 97.4 F (36.3 C)   Wt Readings from Last 3 Encounters:  09/08/16 50 kg (110 lb 3.7 oz)  08/21/16 51.7 kg (114 lb)  08/17/16 51.3 kg (113 lb)    General: chronically unwell looking, comfortable, alert Head:  No asymmetry or signs of trauma.  No edema  Eyes:  No icterus or pallor.  Unable to move the left eye.  Erythema on right lid (pt vs cabinet door)   Ears:  No hearing loss  Nose:  No  discharge Mouth:  Full upper denture.  Only nubs of teeth, with caries, in lower front, rest of teeth are gone.  Tongue midline.   Neck:  No JVD, TMG or brutis Lungs:  Clear bil.  No labored breathng.  Slightly reduced BS in left base Heart: RRR.  No MRG.  S1 and S2 present.   Abdomen:  Soft, ND, no mass or HSM.   Tender without guard or rebound in RUQ.  Active BS.   Rectal: deferred   Musc/Skeltl: no joint redness, swelling, deformity Extremities:  No CCE  Neurologic:  Alert, pleasant, calm.  Oriented x 3.  Moves all 4 limbs, no tremor Skin:  No jaundice, no rash or sores, no telangectasia Tattoos:  none Nodes:  No cervical adenopathy   Psych:  Pleasant, affect sad.  Cooperative, calm.    Intake/Output from previous day: 07/01 0701 - 07/02 0700 In: 1391.8 [I.V.:1391.8] Out: 250 [Urine:250] Intake/Output this shift: No intake/output data recorded.  LAB RESULTS:  Recent Labs  09/08/16 1231 09/09/16 0644  WBC 8.6 5.0  HGB 11.9* 11.7*  HCT 36.1 36.0  PLT 375 323   BMET Lab Results  Component Value Date   NA 142 09/09/2016   NA 142 09/08/2016   NA 143 08/08/2016   K 3.9 09/09/2016   K 3.4 (L) 09/08/2016   K 5.0 08/08/2016   CL 114 (H) 09/09/2016   CL 111 09/08/2016   CL 106  08/08/2016   CO2 23 09/09/2016   CO2 26 09/08/2016   CO2 21 08/08/2016   GLUCOSE 85 09/09/2016   GLUCOSE 98 09/08/2016   GLUCOSE 90 08/08/2016   BUN 10 09/09/2016   BUN 10 09/08/2016   BUN 11 08/08/2016   CREATININE 0.55 09/09/2016   CREATININE 0.70 09/08/2016   CREATININE 0.77 08/08/2016   CALCIUM 8.6 (L) 09/09/2016   CALCIUM 9.4 09/08/2016   CALCIUM 10.3 08/08/2016   LFT  Recent Labs  09/08/16 1231 09/09/16 0644  PROT 6.1* 5.6*  ALBUMIN 3.8 3.4*  AST 15 369*  ALT 12* 354*  ALKPHOS 102 167*  BILITOT 0.4 0.7   PT/INR Lab Results  Component Value Date   INR 1.05 04/14/2016   INR 1.02 04/12/2016   INR 1.0 01/24/2007   Hepatitis Panel No results for input(s): HEPBSAG,  HCVAB, HEPAIGM, HEPBIGM in the last 72 hours. C-Diff No components found for: CDIFF Lipase     Component Value Date/Time   LIPASE 307 (H) 09/08/2016 2010    Drugs of Abuse     Component Value Date/Time   LABOPIA POSITIVE (A) 04/12/2016 0602   COCAINSCRNUR NONE DETECTED 04/12/2016 0602   LABBENZ POSITIVE (A) 04/12/2016 0602   AMPHETMU NONE DETECTED 04/12/2016 0602   THCU NONE DETECTED 04/12/2016 0602   LABBARB NONE DETECTED 04/12/2016 0602     RADIOLOGY STUDIES: Ct Abdomen Pelvis W Contrast  Result Date: 09/08/2016 CLINICAL DATA:  Abdominal pain, diarrhea EXAM: CT ABDOMEN AND PELVIS WITH CONTRAST TECHNIQUE: Multidetector CT imaging of the abdomen and pelvis was performed using the standard protocol following bolus administration of intravenous contrast. CONTRAST:  159m ISOVUE-300 IOPAMIDOL (ISOVUE-300) INJECTION 61% COMPARISON:  07/13/2016 FINDINGS: Lower chest: Lung bases are clear. No effusions. Heart is normal size. Hepatobiliary: There is intrahepatic and extrahepatic biliary ductal dilatation. Common bile duct measures 10 mm. Prior cholecystectomy. Pancreas: Mild pancreatic ductal dilatation. Two small cysts within the pancreas, with a 7 mm cyst in the pancreatic head and 6 mm cyst in the pancreatic body. These are unchanged since recent study. Spleen: No focal abnormality or ductal dilatation. Adrenals/Urinary Tract: Bilateral renal cysts again noted, stable. No hydronephrosis. Adrenal glands and urinary bladder unremarkable. Stomach/Bowel: Stomach, large and small bowel grossly unremarkable. Vascular/Lymphatic: Scattered aortic calcifications. No aneurysm or adenopathy. Reproductive: Prior hysterectomy.  No adnexal masses. Other: No free fluid or free air. Musculoskeletal: No acute bony abnormality. Degenerative disc disease at L4-5. IMPRESSION: Intrahepatic and extrahepatic biliary ductal dilatation. Pancreatic duct slightly prominent. There are no visible obstructing masses or biliary  ductal stones. Small cysts noted in the pancreatic head and body. If further evaluation is felt warranted, MRCP may be beneficial. Bilateral renal cysts.  No hydronephrosis. Electronically Signed   By: KRolm BaptiseM.D.   On: 09/08/2016 14:50   Dg Chest Portable 1 View  Result Date: 09/08/2016 CLINICAL DATA:  Worsening neck pain, head pain, diarrhea, fever, chills, productive cough with yellow sputum, weakness, history hypertension, fibromyalgia, GERD, former smoker EXAM: PORTABLE CHEST 1 VIEW COMPARISON:  Portable exam 1232 hours compared to 06/22/2016 FINDINGS: Normal heart size, mediastinal contours, and pulmonary vascularity. Atherosclerotic calcification aorta. Lungs hyperinflated with minimal peribronchial thickening. No pulmonary infiltrate, pleural effusion or pneumothorax. Bones appear demineralized. IMPRESSION: No acute abnormalities. Aortic Atherosclerosis (ICD10-I70.0) and Emphysema (ICD10-J43.9). Electronically Signed   By: MLavonia DanaM.D.   On: 09/08/2016 12:58     IMPRESSION:   *  ? Biliary pancreatitis.  Transamninases and lipase acutely elevated.  S/p cholecystectomy.  Hx chronic right abd pain.  *  Diarrhea.  Treated for + C diff 07/15/16.  2nd course of Vanco initiated for recurrent diarrhea 5/31 but C diff not retested.   *  04/2016 admission for bacterial meningitis.  Prolonged 6 week course of abx.  xarelto added for sagital sinus thrombosis but d/c'd in 05/2016  *  GIB with blood loss anemia and melena in 05/2016.  EGD unremarkable.  Has not yet had colonoscopy.   CT 1 month ago with gastric, duodenal and right colon thickening: these resolved by CT 09/08/16.  On Protonix as outpt.    *  Fibromyalgia, chronic pain.  Narcotics abuse in past.     PLAN:     *  Discussed with Dr Ardis Hughs.  Ordered MRCP.  Ordered stool for C diff.  Ok to have clears after MRCP completed, as she is hungry.   Acute hepatitis serologies pending.  Repeat lipase and CMET in AM.     Azucena Freed   09/09/2016, 11:32 AM Pager: [phone]  ________________________________________________________________________  Velora Heckler GI MD note:  I personally examined the patient, reviewed the data and agree with the assessment and plan described above.  Complicated issues with recent menigitis, c. Diff, really chronic intermittent RUQ (R flank almost) pains, acutely elevated liver tests and lipase.  Planning on MRI with MRCP; acute viral hep panel is pending, c. Diff pending.     Owens Loffler, MD [facility] Pager [phone]

## 2016-09-09 NOTE — Progress Notes (Signed)
Family Medicine Teaching Service Daily Progress Note Intern Pager: [phone]  Patient name: [name] Medical record number: [id number] Date of birth: 06-12-1950 Age: 66 y.o. Gender: female  Primary Care Provider: [name], MD Consultants: GI, Infectious disease Code Status: DNR  Pt Overview and Major Events to Date:  66 y/o female who presents with RUQ pain that radiates to the right flank and back. This has been present for around 2 weeks. On presentation patient had lipase in 300s and normal lfts. HD Lft elevated up to 300s, with increase in alk phos. Of note patient has a history of lap chole in 2014. Patient was recently seen in February for meningitis. She has had some residual weakness on left side and neck stiffness from that encounter. She was also seen in June for c diff. Seh was discharged on oral vancomycin.  Assessment and Plan: Abdominal Pain, Nausea/Vomiting Still complaining of RUQ pain that is now radiating to right flank and back. Patient has been afebrile, with WBC of 8.5-->5.0. She presented with a lipase in the 300s, Has had a bump in LFTs today. AST/ALT/Alkphos all elevated of 369/354/169 respectively. Patient does had ductal dilatation which is consistent with her history of lap chole in 2014. Patient is still npo and receiving iv fluids and pain control. Picture more consistent with pancreatitis vs PUD. Given elevated LFTs and lipase we will treat like pancreatitis with supportive measures. GI has been called and we will follow up their recommendations. - f/u GI consult - NPO - IV Fluids at 163m/hr - pain control with iv morphine   Headache/Neck Pain Has had chronic neck pain/headache since diagnosis of meningitis in February. This headache is no always there but she does feel as though her neck is always stiff. Infectious disease has commented and does not believe the neck pain is currently related to past diagnosis of meningitis. Patient not aware of any  past trauma - Will proceed with neck xrays to r/u trauma - pain control  H/O C. Diff colitis Admitted in 04/2016 for bacterial meningitis and subsequently developed C. difficil colitis from course of antibiotics.  Had been prescribed two courses of Vancomycin at the time and has completed these.  ID consulted in ED and recommended additional oral vancomycin treatment with prolonged taper lasting approximately 6 weeks in total. Discussed that patient may also benefit from discussion regarding fecal transplant if this does not work.   Per ID recs >> will start Vancomycin taper as follows :                    -125 mg po Vancocin 4x daily x 14 days                   -125 mg po Vancocin 2x daily x 7 days                   -125 mg po Vancocin daily x 7 days                   -125 mg po Vancocin every other day x 7 days                   -125 mg po Vancocin every 3 days x 14 days  -will need to hold vanc until able to obtain still cx -Will follow up stool cx  -Enteric precautions  -IV fluids: NS '@80cc'$ /hr x12 hours for hydration   PPx: protonix,   Disposition:  home  Subjective:  Pain better controlled and has moved from mid-epigastrum. Able to ambulate out of bed to chair. No n/v. NO blood bm. Watery bm on admission. No bm since.  Objective: Temp:  [97.4 F (36.3 C)-99.1 F (37.3 C)] 97.4 F (36.3 C) (07/02 0845) Pulse Rate:  [63-89] 70 (07/02 0845) Resp:  [13-20] 18 (07/02 0845) BP: (111-156)/(58-87) 133/70 (07/02 0845) SpO2:  [94 %-99 %] 98 % (07/02 0845) Weight:  [110 lb (49.9 kg)-110 lb 3.7 oz (50 kg)] 110 lb 3.7 oz (50 kg) (07/01 2147) Physical Exam: General: alert, oriented x4. No acute distress. Cardiovascular: rrr, no rubs, gallops murmurs. Cap refill <2seconds Respiratory: LCTAB, no increased work of breathing Abdomen: soft, tender in RUQ. No rebound, no peritonitis. Extremities: Able to move all 4 extremities. Some residual weakness in LLE.  Laboratory:  Recent Labs Lab  09/08/16 1231 09/09/16 0644  WBC 8.6 5.0  HGB 11.9* 11.7*  HCT 36.1 36.0  PLT 375 323    Recent Labs Lab 09/08/16 1231 09/09/16 0644  NA 142 142  K 3.4* 3.9  CL 111 114*  CO2 26 23  BUN 10 10  CREATININE 0.70 0.55  CALCIUM 9.4 8.6*  PROT 6.1* 5.6*  BILITOT 0.4 0.7  ALKPHOS 102 167*  ALT 12* 354*  AST 15 369*  GLUCOSE 98 85      Imaging/Diagnostic Tests: CLINICAL DATA:  Abdominal pain, diarrhea  EXAM: CT ABDOMEN AND PELVIS WITH CONTRAST  TECHNIQUE: Multidetector CT imaging of the abdomen and pelvis was performed using the standard protocol following bolus administration of intravenous contrast.  CONTRAST:  130m ISOVUE-300 IOPAMIDOL (ISOVUE-300) INJECTION 61%  COMPARISON:  07/13/2016  FINDINGS: Lower chest: Lung bases are clear. No effusions. Heart is normal size.  Hepatobiliary: There is intrahepatic and extrahepatic biliary ductal dilatation. Common bile duct measures 10 mm. Prior cholecystectomy.  Pancreas: Mild pancreatic ductal dilatation. Two small cysts within the pancreas, with a 7 mm cyst in the pancreatic head and 6 mm cyst in the pancreatic body. These are unchanged since recent study.  Spleen: No focal abnormality or ductal dilatation.  Adrenals/Urinary Tract: Bilateral renal cysts again noted, stable. No hydronephrosis. Adrenal glands and urinary bladder unremarkable.  Stomach/Bowel: Stomach, large and small bowel grossly unremarkable.  Vascular/Lymphatic: Scattered aortic calcifications. No aneurysm or adenopathy.  Reproductive: Prior hysterectomy.  No adnexal masses.  Other: No free fluid or free air.  Musculoskeletal: No acute bony abnormality. Degenerative disc disease at L4-5.  IMPRESSION: Intrahepatic and extrahepatic biliary ductal dilatation. Pancreatic duct slightly prominent. There are no visible obstructing masses or biliary ductal stones. Small cysts noted in the pancreatic head and body. If further  evaluation is felt warranted, MRCP may be beneficial.  Bilateral renal cysts.  No hydronephrosis.  JGuadalupe Dawn M.D. 09/09/2016, 12:00 PM PGY-1, [place] pager: [phone] text pages welcome

## 2016-09-10 DIAGNOSIS — A0472 Enterocolitis due to Clostridium difficile, not specified as recurrent: Secondary | ICD-10-CM

## 2016-09-10 LAB — CBC
HCT: 36.4 % (ref 36.0–46.0)
Hemoglobin: 11.7 g/dL — ABNORMAL LOW (ref 12.0–15.0)
MCH: 30.5 pg (ref 26.0–34.0)
MCHC: 32.1 g/dL (ref 30.0–36.0)
MCV: 94.8 fL (ref 78.0–100.0)
Platelets: 347 10*3/uL (ref 150–400)
RBC: 3.84 MIL/uL — ABNORMAL LOW (ref 3.87–5.11)
RDW: 15.6 % — ABNORMAL HIGH (ref 11.5–15.5)
WBC: 4.9 10*3/uL (ref 4.0–10.5)

## 2016-09-10 LAB — LIPASE, BLOOD: Lipase: 24 U/L (ref 11–51)

## 2016-09-10 LAB — COMPREHENSIVE METABOLIC PANEL
ALT: 206 U/L — ABNORMAL HIGH (ref 14–54)
AST: 92 U/L — ABNORMAL HIGH (ref 15–41)
Albumin: 3.5 g/dL (ref 3.5–5.0)
Alkaline Phosphatase: 144 U/L — ABNORMAL HIGH (ref 38–126)
Anion gap: 7 (ref 5–15)
BUN: 9 mg/dL (ref 6–20)
CO2: 25 mmol/L (ref 22–32)
Calcium: 9.2 mg/dL (ref 8.9–10.3)
Chloride: 110 mmol/L (ref 101–111)
Creatinine, Ser: 0.62 mg/dL (ref 0.44–1.00)
GFR calc Af Amer: >60 mL/min (ref >60)
GFR calc non Af Amer: >60 mL/min (ref >60)
Glucose, Bld: 72 mg/dL (ref 65–99)
Potassium: 4.8 mmol/L (ref 3.5–5.1)
Sodium: 142 mmol/L (ref 135–145)
Total Bilirubin: 0.6 mg/dL (ref 0.3–1.2)
Total Protein: 5.7 g/dL — ABNORMAL LOW (ref 6.5–8.1)

## 2016-09-10 LAB — HEPATITIS PANEL, ACUTE
HCV Ab: <0.1 s/co ratio (ref 0.0–0.9)
Hep A IgM: NEGATIVE
Hep B C IgM: NEGATIVE
Hepatitis B Surface Ag: NEGATIVE

## 2016-09-10 MED ORDER — OXYCODONE HCL 5 MG PO TABS
10.0000 mg | ORAL_TABLET | Freq: Four times a day (QID) | ORAL | Status: DC | PRN
  Administered 2016-09-10 – 2016-09-11 (×4): 10 mg via ORAL
  Filled 2016-09-10 (×4): qty 2

## 2016-09-10 MED ORDER — VANCOMYCIN 50 MG/ML ORAL SOLUTION
500.0000 mg | Freq: Four times a day (QID) | ORAL | Status: DC
  Administered 2016-09-10 – 2016-09-11 (×4): 500 mg via ORAL
  Filled 2016-09-10 (×5): qty 10

## 2016-09-10 MED ORDER — VANCOMYCIN 50 MG/ML ORAL SOLUTION: 125.0000 mg | Freq: Two times a day (BID) | ORAL | Status: DC

## 2016-09-10 MED ORDER — MORPHINE SULFATE (PF) 2 MG/ML IV SOLN
2.0000 mg | Freq: Three times a day (TID) | INTRAVENOUS | Status: DC | PRN
  Administered 2016-09-10 – 2016-09-11 (×2): 2 mg via INTRAVENOUS
  Filled 2016-09-10 (×2): qty 1

## 2016-09-10 MED ORDER — VANCOMYCIN 50 MG/ML ORAL SOLUTION: 125.0000 mg | ORAL | Status: DC

## 2016-09-10 MED ORDER — METHOCARBAMOL 500 MG PO TABS
500.0000 mg | ORAL_TABLET | Freq: Four times a day (QID) | ORAL | Status: DC
  Administered 2016-09-10 – 2016-09-11 (×3): 500 mg via ORAL
  Filled 2016-09-10 (×4): qty 1

## 2016-09-10 MED ORDER — VANCOMYCIN 50 MG/ML ORAL SOLUTION: 125.0000 mg | Freq: Every day | ORAL | Status: DC

## 2016-09-10 MED ORDER — VANCOMYCIN 50 MG/ML ORAL SOLUTION
125.0000 mg | Freq: Four times a day (QID) | ORAL | Status: DC
  Administered 2016-09-10 (×2): 125 mg via ORAL
  Filled 2016-09-10 (×3): qty 2.5

## 2016-09-10 MED ORDER — FAMOTIDINE 20 MG PO TABS
20.0000 mg | ORAL_TABLET | Freq: Two times a day (BID) | ORAL | Status: DC
  Administered 2016-09-10: 20 mg via ORAL
  Filled 2016-09-10: qty 1

## 2016-09-10 MED ORDER — OXYCODONE HCL 5 MG PO TABS: 5.0000 mg | ORAL_TABLET | Freq: Four times a day (QID) | ORAL | Status: DC | PRN

## 2016-09-10 NOTE — Progress Notes (Signed)
[facility] Progress Note    Since last GI note: Still having RUQ pain.  Only one BM since admission (3 days ago).  She was asking about going home today, eager to go home.  MRI yesterday, see impression below  Objective: Vital signs in last 24 hours: Temp:  [97.4 F (36.3 C)-98.1 F (36.7 C)] 98 F (36.7 C) (07/03 0520) Pulse Rate:  [65-79] 65 (07/03 0520) Resp:  [18] 18 (07/03 0520) BP: (133-152)/(65-101) 148/101 (07/03 0520) SpO2:  [98 %-100 %] 100 % (07/03 0520) Weight:  [110 lb 3.7 oz (50 kg)] 110 lb 3.7 oz (50 kg) (07/02 2016) Last BM Date: 09/08/16 General: alert and oriented times 3 Heart: regular rate and rythm Abdomen: soft, mildly tender RUQ, non-distended, normal bowel sounds   7/3 C. Diff toxin and antigen ++ yesterday  Lab Results:  Recent Labs  09/08/16 1231 09/09/16 0644 09/10/16 0538  WBC 8.6 5.0 4.9  HGB 11.9* 11.7* 11.7*  PLT 375 323 347  MCV 92.6 94.5 94.8    Recent Labs  09/08/16 1231 09/09/16 0644  NA 142 142  K 3.4* 3.9  CL 111 114*  CO2 26 23  GLUCOSE 98 85  BUN 10 10  CREATININE 0.70 0.55  CALCIUM 9.4 8.6*    Recent Labs  09/08/16 1231 09/09/16 0644  PROT 6.1* 5.6*  ALBUMIN 3.8 3.4*  AST 15 369*  ALT 12* 354*  ALKPHOS 102 167*  BILITOT 0.4 0.7     Medications: Scheduled Meds: . carisoprodol  350 mg Oral TID  . carvedilol  6.25 mg Oral BID WC  . enoxaparin (LOVENOX) injection  40 mg Subcutaneous Q24H  . pregabalin  50 mg Oral BID  . senna  1 tablet Oral BID  . vancomycin  125 mg Oral QID   Followed by  . [START ON 09/22/2016] vancomycin  125 mg Oral BID   Followed by  . [START ON 09/30/2016] vancomycin  125 mg Oral Daily   Followed by  . [START ON 10/07/2016] vancomycin  125 mg Oral QODAY   Followed by  . [START ON 10/15/2016] vancomycin  125 mg Oral Q3 days   Continuous Infusions: . famotidine (PEPCID) IV 20 mg (09/09/16 1811)   PRN Meds:.morphine injection, ondansetron **OR** ondansetron  (ZOFRAN) IV  MRI/MRCP yesterday IMPRESSION: 1. Cholecystectomy with upper normal intra and extrahepatic biliary ducts. No cause identified. 2. Subcentimeter cystic lesions within the pancreas. Most likely pseudocysts. Indolent cystic neoplasms could look similar. Per consensus criteria, these warrant followup with pre and post contrast abdominal MRI/MRCP at 2 years. This recommendation follows ACR consensus guidelines: Management of Incidental Pancreatic Cysts: A White Paper of the ACR Incidental Findings Committee. J Am Coll Radiol 2836;62:947-654. 3. Bilateral renal cysts and complex cysts. No enhancing renal  Assessment/Plan: 66 y.o. female with C. Diff + diarrhea, elevated liver tests, chronic RUQ pain  First, she tells me she completed 2 ten day courses of vancomycin for C. Diff + diarrhea.  This had NO affect on her diarrhea.  Her C. Diff was rechecked yesterday and toxin+, antigen +.  She was started on long tapering course of vancomycin at admission and has only had one BM in the past 2-3 days.  I'm glad the vancomycin is working now and agree with a long tapering course.  Wonder if she was really took those two 10 day courses.   Her LFTs were very elevated; awaiting acute viral hep panel results.  She has been taking a lot of  tylenol for many weeks (has been taking 4 grams of tylenol daily) which may account for the transaminase elevation.  If transaminases rising, will need to check acetominophen level and possibly start mucomyst; would also check ANA, AMA, ASMA.  If transaminases are trending down, would likely 'blame' tylenol and no need for further lab workup.  Her RUQ is chronic. SHe had her GB removed for it 4 years ago.  Unclear etiology.  MRI yesterday shows no sign of biliary stones, the mild dilated ducts are probably related to her cholecystectomy remotely.  I agree with follow up MRI in 2 years as recommended by radiologist.  She wants to advance diet, I think that is safe  and will order.  Milus Banister, MD  09/10/2016, 7:19 AM [facility] Pager [phone]

## 2016-09-10 NOTE — Progress Notes (Signed)
Family Medicine Progress Note  Saw and examined patient. Informed patient of downtrend in lfts and lipase. She tolerated lunch very well with no n/v. Told patient we would like to keep her overnight to see how she tolerates dinner and breakfast tomorrow. Now that patient tolerating po will switch over to oral pain meds. Pending LFts tomorrow and patient pain control/po tolerance possible dc tomorrow.  Deirdre Peer MD PGY-1 Family Medicine Resident

## 2016-09-10 NOTE — Progress Notes (Signed)
Family Medicine Teaching Service Daily Progress Note Intern Pager: [phone]  Patient name: [name] Medical record number: [id number] Date of birth: 02/02/1951 Age: 66 y.o. Gender: female  Primary Care Provider: [name], MD Consultants: GI, Infectious disease Code Status: DNR  Pt Overview and Major Events to Date:  66 y/o female who presents with RUQ pain that radiates to the right flank and back. This has been present for around 2 weeks. On presentation patient had lipase in 300s and normal lfts. HD Lft elevated up to 300s, with increase in alk phos. Of note patient has a history of lap chole in 2014. Patient was recently seen in February for meningitis. She has had some residual weakness on left side and neck stiffness from that encounter. She was also seen in June for c diff. Seh was discharged on oral vancomycin. Patient received MRCP on 7/2. Found to have small pseudocysts in pancreas. Improving LFTs and lipase.  Assessment and Plan: Abdominal Pain, Nausea/Vomiting Still complaining of RUQ pain that is now radiating to right flank and back. Patient has been afebrile, with WBC of 8.5-->5.0-->4.9. She presented with a lipase in the 300s, and AST/ALT/Alkphos all elevated of 369/354/169 respectively. Today these have greatly improved to have a lipase of 24 -->307. AST/ALT/Alk phos 92/206/144. Up-trending LFTs likely related to tylenol but unclear why they were initial normal at admission. Her pain is doing better today on pain meds. At this time,i t seems like the patient possibly has hepatitis from weeks of overuse of acetaminophen coupled with a possible acute pancreatitis. It is unclear if these are related to the pseudocysts. - F/U viral hepatitis panel - F/U GI recommendations - Will start diet, if tolerated po can dc iv fluids - pain control with iv morphine and oral pain meds   Headache/Neck Pain Has had chronic neck pain/headache since diagnosis of meningitis in  February. This headache is no always there but she does feel as though her neck is always stiff. Infectious disease has commented and does not believe the neck pain is currently related to past diagnosis of meningitis. Patient not aware of any past trauma - DGG found on neck xray - Will try muscle relaxer to help neck stiffness  H/O C. Diff colitis Admitted in 04/2016 for bacterial meningitis and subsequently developed C. difficil colitis from course of antibiotics.  Had been prescribed two courses of Vancomycin at the time and has completed these.  ID consulted in ED and recommended additional oral vancomycin treatment with prolonged taper lasting approximately 6 weeks in total. Discussed that patient may also benefit from discussion regarding fecal transplant if this does not work. Per ID recs >> will start Vancomycin taper as follows :                    -125 mg po Vancocin 4x daily x 14 days                   -125 mg po Vancocin 2x daily x 7 days                   -125 mg po Vancocin daily x 7 days                   -125 mg po Vancocin every other day x 7 days                   -125 mg po Vancocin every 3 days x  14 days  -Obtained stool culture, + for c diff toxin - Will discuss with id regarding possibility/indications of fidaxomicin. Unclear if vanc simply ineffective vs non-compliance for patient -Enteric precautions   PPx: protonix,   Disposition: home  Subjective:  Pain better controlled. No bm today. Tolerated MRCP well. No complaints. Excited to try some real food for lunch. Able to ambulate.  Objective: Temp:  [98 F (36.7 C)-98.1 F (36.7 C)] 98 F (36.7 C) (07/03 0520) Pulse Rate:  [65-79] 65 (07/03 0520) Resp:  [18] 18 (07/03 0520) BP: (136-152)/(65-101) 148/101 (07/03 0520) SpO2:  [99 %-100 %] 100 % (07/03 0520) Weight:  [110 lb 3.7 oz (50 kg)] 110 lb 3.7 oz (50 kg) (07/02 2016)   Physical Exam: General: alert, oriented x4. No acute distress. Cardiovascular: rrr, no  rubs, gallops murmurs. Cap refill <2seconds Respiratory: LCTAB, no increased work of breathing Abdomen: soft, tender in RUQ. No rebound, no peritonitis. Extremities: Able to move all 4 extremities. Some residual weakness in LLE. Neuro: Alert, oriented x4. Psych: appropriately answers questions  Laboratory:  Recent Labs Lab 09/08/16 1231 09/09/16 0644 09/10/16 0538  WBC 8.6 5.0 4.9  HGB 11.9* 11.7* 11.7*  HCT 36.1 36.0 36.4  PLT 375 323 347    Recent Labs Lab 09/08/16 1231 09/09/16 0644 09/10/16 0538  NA 142 142 142  K 3.4* 3.9 4.8  CL 111 114* 110  CO2 '26 23 25  '$ BUN '10 10 9  '$ CREATININE 0.70 0.55 0.62  CALCIUM 9.4 8.6* 9.2  PROT 6.1* 5.6* 5.7*  BILITOT 0.4 0.7 0.6  ALKPHOS 102 167* 144*  ALT 12* 354* 206*  AST 15 369* 92*  GLUCOSE 98 85 72      Imaging/Diagnostic Tests: CLINICAL DATA:  Abdominal pain, diarrhea  EXAM: CT ABDOMEN AND PELVIS WITH CONTRAST  TECHNIQUE: Multidetector CT imaging of the abdomen and pelvis was performed using the standard protocol following bolus administration of intravenous contrast.  CONTRAST:  168m ISOVUE-300 IOPAMIDOL (ISOVUE-300) INJECTION 61%  COMPARISON:  07/13/2016  FINDINGS: Lower chest: Lung bases are clear. No effusions. Heart is normal size.  Hepatobiliary: There is intrahepatic and extrahepatic biliary ductal dilatation. Common bile duct measures 10 mm. Prior cholecystectomy.  Pancreas: Mild pancreatic ductal dilatation. Two small cysts within the pancreas, with a 7 mm cyst in the pancreatic head and 6 mm cyst in the pancreatic body. These are unchanged since recent study.  Spleen: No focal abnormality or ductal dilatation.  Adrenals/Urinary Tract: Bilateral renal cysts again noted, stable. No hydronephrosis. Adrenal glands and urinary bladder unremarkable.  Stomach/Bowel: Stomach, large and small bowel grossly unremarkable.  Vascular/Lymphatic: Scattered aortic calcifications. No aneurysm  or adenopathy.  Reproductive: Prior hysterectomy.  No adnexal masses.  Other: No free fluid or free air.  Musculoskeletal: No acute bony abnormality. Degenerative disc disease at L4-5.  IMPRESSION: Intrahepatic and extrahepatic biliary ductal dilatation. Pancreatic duct slightly prominent. There are no visible obstructing masses or biliary ductal stones. Small cysts noted in the pancreatic head and body. If further evaluation is felt warranted, MRCP may be beneficial.  Bilateral renal cysts.  No hydronephrosis.  CLINICAL DATA:  Elevated liver function tests.  Pancreatitis.  EXAM: MRI ABDOMEN WITHOUT AND WITH CONTRAST (INCLUDING MRCP)  TECHNIQUE: Multiplanar multisequence MR imaging of the abdomen was performed both before and after the administration of intravenous contrast. Heavily T2-weighted images of the biliary and pancreatic ducts were obtained, and three-dimensional MRCP images were rendered by post processing.  CONTRAST:  11mMULTIHANCE GADOBENATE DIMEGLUMINE 529  MG/ML IV SOLN  COMPARISON:  CT of 1 day prior  FINDINGS: Portions of the dedicated MRCP series are motion degraded.  Lower chest: Mild cardiomegaly, without pericardial or pleural effusion.  Hepatobiliary: No suspicious liver lesion. Status post cholecystectomy. Intrahepatic ducts are upper normal after cholecystectomy. The common duct is also upper normal, including at 10 mm on image 21/ series 5, image 44/series 8. Given mild limitations of MRCP series, no evidence of choledocholithiasis.  Pancreas: No pancreatic duct dilatation or acute pancreatitis. Multiple simple appearing cystic foci are identified within the pancreas, including lesions within the body, head, and uncinate process. The largest is within the pancreatic head at 7 mm on image 23/series 5. No post-contrast enhancement.  Spleen:  Normal in size, without focal abnormality.  Adrenals/Urinary Tract: Normal adrenal  glands. In both kidneys there are precontrast T1 hyperintense lesions. The largest is within the upper pole left kidney at 11 mm on image 25/series 1500. None of these lesions demonstrate postcontrast enhancement, including on subtracted images. There are also too small to characterize renal lesions. No hydronephrosis.  Stomach/Bowel: Proximal gastric underdistention. Apparent wall thickening could be secondary. Normal large and small bowel loops.  Vascular/Lymphatic: Aortic atherosclerosis. No retroperitoneal or retrocrural adenopathy.  Other:  No ascites.  Musculoskeletal: No acute osseous abnormality.  IMPRESSION: 1. Cholecystectomy with upper normal intra and extrahepatic biliary ducts. No cause identified. 2. Subcentimeter cystic lesions within the pancreas. Most likely pseudocysts. Indolent cystic neoplasms could look similar. Per consensus criteria, these warrant followup with pre and post contrast abdominal MRI/MRCP at 2 years. This recommendation follows ACR consensus guidelines: Management of Incidental Pancreatic Cysts: A White Paper of the ACR Incidental Findings Committee. J Am Coll Radiol 0761;51:834-373. 3. Bilateral renal cysts and complex cysts. No enhancing renal lesion.  Guadalupe Dawn, M.D. 09/10/2016, 9:49 AM PGY-1, [place] Intern pager: [phone], text pages welcome

## 2016-09-10 NOTE — Consult Note (Signed)
[facility] for Infectious Disease    Date of Admission:  09/08/2016    Total days of antibiotics . 1          Vancomycin Day 1               Reason for Consult: Re ocurrent C. difficile colitis    Referring Provider: Dorcas Mcmurray MD Primary Care Provider: Merri Cardenas   Assessment: [name] with PMHx of recent prolonged antibiotic use causing C- Diff colitis. C. difficile colitis. Patient had third reoccurrence of C. difficile colitis, we don't think this is a vancomycin failure, most likely because of insufficient treatment. During second reoccurrence she was prescribed a tapering dose, she never did the tapering part.  According to trials vancomycin with tapering dose and Fidaxomicin both have almost equal response in reoccurring C. difficile colitis. Fidaxomicin has low reoccurrence rate during treatment with initial episode. Fidaxomicin is very expensive as compared to oral vancomycin.  We will try treating it with high-dose vancomycin with a taper.  Plan: 1. Vancomycin at 500 mg every 6 hourly for first 2 weeks, followed by tapering dose as follows. -125 mg po Vancocin 2x daily x 7 days  -125 mg po Vancocin daily x 7 days  -125 mg po Vancocin every other day x 7 days  -125 mg po Vancocin every 3 days x 14 days   2. She does not have GERD symptoms-we will D/C Pepcid as it can also cause eradication of her C Diff colitis difficult.   . carisoprodol  350 mg Oral TID  . carvedilol  6.25 mg Oral BID WC  . enoxaparin (LOVENOX) injection  40 mg Subcutaneous Q24H  . famotidine  20 mg Oral BID  . pregabalin  50 mg Oral BID  . vancomycin  125 mg Oral QID   Followed by  . [START ON 09/24/2016] vancomycin  125 mg Oral BID   Followed by  . [START ON 10/01/2016] vancomycin  125 mg Oral Daily   Followed by  . [START ON 10/08/2016] vancomycin  125 mg Oral QODAY   Followed by  . [START ON 10/16/2016] vancomycin  125 mg Oral Q3 days    HPI: [name] is a 66  y.o. female with PMHx significant for fibromyalgia, hypertension, pneumococcal bacteremia and meningitis in February 2018 followed by CVA and sagittal sinus thrombosis in March 2018, had prolonged multiple antibiotics, developed C. difficile in the beginning of May came to ED complaining of right upper and lower quadrant abdominal pain along with intermittent nausea, vomiting and diarrhea. CT A/P with intrahepatic and extrahepatic biliary ductal dilatation, and pancreatic duct slightly prominent. However no visible obstructing masses or biliary ductal stones. Small cysts noted in the pancreatic head and body.  Given extrahepatic and intrahepatic biliary duct dilation, MRCP was done which shows normal internal and extrahepatic biliary ducts and a small cystic lesion within the pancreas, follow-up MRCP is recommended in 2 years. She had an initially elevated lipase which has been ressolved, high hepatic function on admission was normal, she did develop transaminitis the next day not trending down,Hepatitis screen is negative-most likely because of excessive Tylenol use.  ID was consulted-once she was found to have positive C. difficile antigen and toxin, because of her recurrence of C. Difficile. -Initially she was treated for positive C. difficile colitis on 07/13/2016 for 10 days of vancomycin. Patient did completed that course, resulted in resolution of her symptoms for a few  days, her symptoms restarted after a week, for which she was seen again by her PCP on 08/08/2016. She was given 2 prescriptions for vancomycin, 1 for initial 2 weeks and the second one was for tapering. She did took vancomycin for about 10 days, because of persistence of her diarrhea and abdominal pain, she never picked up the second prescription. She was recently admitted on 09/08/2016 because of worsening right upper and lower quadrant abdominal pain, found to have persistent diarrhea, tested again for C. difficile which was positive  for antigen and toxic. She was started on vancomycin 125 mg every 6 hourly today. According to patient she did not had any diarrhea for last 2 days, she was nothing by mouth, he started diet today. She denies any more nausea or vomiting and was able to tolerate her meals.  Review of Systems: ROS  Past Medical History:  Diagnosis Date  . Arrhythmia   . Arthritis   . Cancer ([place])   . Fibromyalgia   . GERD (gastroesophageal reflux disease)   . H/O: substance abuse 2005   narcotic usage due to chronic back pain  . Heart murmur   . Hypertension   . Meningitis   . PONV (postoperative nausea and vomiting)     Social History  Substance Use Topics  . Smoking status: Former Smoker    Packs/day: 1.00    Years: 30.00    Types: Cigarettes  . Smokeless tobacco: Never Used  . Alcohol use No    Family History  Problem Relation Age of Onset  . Alzheimer's disease Mother   . Cancer Father        prostate  . Heart disease Father    Allergies  Allergen Reactions  . Codeine Nausea And Vomiting  . Nsaids Other (See Comments)    Stomach ulcers    OBJECTIVE: Blood pressure 124/70, pulse 76, temperature 97.8 F (36.6 C), temperature source Oral, resp. rate 18, height 5\' 4"  (1.626 m), weight 110 lb 3.7 oz (50 kg), SpO2 100 %.  Physical Exam.  Vitals:   09/09/16 1800 09/09/16 2016 09/10/16 0520 09/10/16 1027  BP: (!) 152/80 136/65 (!) 148/101 124/70  Pulse: 79 73 65 76  Resp:  18 18 18   Temp: 98 F (36.7 C) 98.1 F (36.7 C) 98 F (36.7 C) 97.8 F (36.6 C)  TempSrc: Oral Oral Oral Oral  SpO2:  99% 100% 100%  Weight:  110 lb 3.7 oz (50 kg)    Height:       General: Vital signs reviewed.  Patient is well-developed and well-nourished, in no acute distress and cooperative with exam.  Cardiovascular: RRR, S1 normal, S2 normal, no murmurs, gallops, or rubs. Pulmonary/Chest: Clear to auscultation bilaterally, no wheezes, rales, or rhonchi. Abdominal: Soft,Mild diffuse tenderness  more pronounced at right upper and lower quadrant, non-distended, BS +, no masses, organomegaly, or guarding present.  Extremities: No lower extremity edema bilaterally,  pulses symmetric and intact bilaterally. No cyanosis or clubbing.   Lab Results Lab Results  Component Value Date   WBC 4.9 09/10/2016   HGB 11.7 (L) 09/10/2016   HCT 36.4 09/10/2016   MCV 94.8 09/10/2016   PLT 347 09/10/2016    Lab Results  Component Value Date   CREATININE 0.62 09/10/2016   BUN 9 09/10/2016   NA 142 09/10/2016   K 4.8 09/10/2016   CL 110 09/10/2016   CO2 25 09/10/2016    Lab Results  Component Value Date   ALT 206 (H)  09/10/2016   AST 92 (H) 09/10/2016   ALKPHOS 144 (H) 09/10/2016   BILITOT 0.6 09/10/2016     Microbiology: Recent Results (from the past 240 hour(s))  C difficile quick scan w PCR reflex     Status: Abnormal   Collection Time: 09/10/16  1:38 AM  Result Value Ref Range Status   C Diff antigen POSITIVE (A) NEGATIVE Final   C Diff toxin POSITIVE (A) NEGATIVE Final   C Diff interpretation Toxin producing C. difficile detected.  Final    Lorella Nimrod, [place] [facility] [phone] pager   [phone] cell 09/10/2016, 3:05 PM

## 2016-09-11 LAB — COMPREHENSIVE METABOLIC PANEL WITH GFR
ALT: 137 U/L — ABNORMAL HIGH (ref 14–54)
AST: 39 U/L (ref 15–41)
Albumin: 3.3 g/dL — ABNORMAL LOW (ref 3.5–5.0)
Alkaline Phosphatase: 139 U/L — ABNORMAL HIGH (ref 38–126)
Anion gap: 6 (ref 5–15)
BUN: 18 mg/dL (ref 6–20)
CO2: 26 mmol/L (ref 22–32)
Calcium: 8.8 mg/dL — ABNORMAL LOW (ref 8.9–10.3)
Chloride: 109 mmol/L (ref 101–111)
Creatinine, Ser: 0.88 mg/dL (ref 0.44–1.00)
GFR calc Af Amer: >60 mL/min
GFR calc non Af Amer: >60 mL/min
Glucose, Bld: 103 mg/dL — ABNORMAL HIGH (ref 65–99)
Potassium: 3.5 mmol/L (ref 3.5–5.1)
Sodium: 141 mmol/L (ref 135–145)
Total Bilirubin: 0.3 mg/dL (ref 0.3–1.2)
Total Protein: 5.5 g/dL — ABNORMAL LOW (ref 6.5–8.1)

## 2016-09-11 LAB — C DIFFICILE QUICK SCREEN W PCR REFLEX
C Diff antigen: POSITIVE — AB
C Diff interpretation: DETECTED
C Diff toxin: POSITIVE — AB

## 2016-09-11 LAB — CBC
HCT: 35.1 % — ABNORMAL LOW (ref 36.0–46.0)
Hemoglobin: 11.3 g/dL — ABNORMAL LOW (ref 12.0–15.0)
MCH: 30.4 pg (ref 26.0–34.0)
MCHC: 32.2 g/dL (ref 30.0–36.0)
MCV: 94.4 fL (ref 78.0–100.0)
Platelets: 364 10*3/uL (ref 150–400)
RBC: 3.72 MIL/uL — ABNORMAL LOW (ref 3.87–5.11)
RDW: 15.3 % (ref 11.5–15.5)
WBC: 7.1 10*3/uL (ref 4.0–10.5)

## 2016-09-11 MED ORDER — OXYCODONE HCL 5 MG PO TABS: 5.0000 mg | ORAL_TABLET | Freq: Four times a day (QID) | ORAL | 0 refills | Status: DC | PRN

## 2016-09-11 MED ORDER — VANCOMYCIN HCL 250 MG PO CAPS: 250.0000 mg | ORAL_CAPSULE | Freq: Four times a day (QID) | ORAL | 1 refills | Status: DC

## 2016-09-11 MED ORDER — VANCOMYCIN HCL 125 MG PO CAPS: 125.0000 mg | ORAL_CAPSULE | Freq: Four times a day (QID) | ORAL | 0 refills | Status: DC

## 2016-09-11 MED ORDER — METHOCARBAMOL 500 MG PO TABS: 500.0000 mg | ORAL_TABLET | Freq: Four times a day (QID) | ORAL | 0 refills | Status: DC

## 2016-09-11 MED ORDER — METHOCARBAMOL 500 MG PO TABS: 500.0000 mg | ORAL_TABLET | Freq: Four times a day (QID) | ORAL | 2 refills | Status: DC

## 2016-09-11 NOTE — Progress Notes (Signed)
Family Medicine Teaching Service Daily Progress Note Intern Pager: [phone]  Patient name: [name] Medical record number: [id number] Date of birth: 1951/01/08 Age: 65 y.o. Gender: female  Primary Care Provider: [name], MD Consultants: GI, Infectious disease Code Status: DNR  Pt Overview and Major Events to Date:  66 y/o female who presents with RUQ pain that radiates to the right flank and back. This has been present for around 2 weeks prior to admission. On presentation patient had lipase in 300s and normal lfts. HD1 Lft elevated up to 300s, with increase in alk phos. Of note patient has a history of lap chole in 2014. Patient was recently seen in February for meningitis. She has had some residual weakness on left side and neck stiffness from that encounter. She was also seen in June for c diff. She was discharged on oral vancomycin. Patient received MRCP on 7/2. Found to have small pseudocysts in pancreas. Improving LFTs and lipase.  Assessment and Plan: Abdominal Pain, Nausea/Vomiting RUQ pain improved on oral pain meds, but still present. Patient has been afebrile, with WBC of 8.5-->5.0-->4.9-->7.1. She presented with a lipase in the 300s, and AST/ALT/Alkphos all elevated of 369/354/169 respectively. Today these have greatly improved to have a lipase of 24 <--307. AST/ALT/Alk phos 92/206/144--> 39/137/139. Up-trending LFTs likely related to tylenol but unclear why they were initial normal at admission. At this time,i t seems like the patient possibly has hepatitis from weeks of overuse of acetaminophen coupled with a possible acute pancreatitis. It is unclear if these are related to the pseudocysts. Viral hepatitis panel is negative. She is tolerating a regular diet with no n/v. - F/U GI recommendations - pain control with oral pain meds   Headache/Neck Pain Has had chronic neck pain/headache since diagnosis of meningitis in February. This headache is not always there but she  does feel as though her neck is always stiff. Infectious disease has commented and does not believe the neck pain is currently related to past diagnosis of meningitis. Patient not aware of any past trauma. Neck stiffness has greatly improved on robaxin. - DGG found on neck xray - Continue robaxin for neck stiffness  H/O C. Diff colitis Admitted in 04/2016 for bacterial meningitis and subsequently developed C. difficil colitis from course of antibiotics.  Had been prescribed two courses of Vancomycin at the time and has completed these.  Discussed that patient may also benefit from discussion regarding fecal transplant if this does not work. ID asked to re-evaluate vancomycin vs fidaxomicin in setting of stool culture positive for toxin. ID felt that poor compliance led to outpt failure on vancomycin. This coupled with the expense of fidaxomicin have led for them to re-recommend outpatient treatment with vanc. Patient with only one bowel movement since admission. Vanc started 7/3. Per ID recs >> will start Vancomycin taper as follows :                    -125 mg po Vancocin 4x daily x 14 days                   -125 mg po Vancocin 2x daily x 7 days                   -125 mg po Vancocin daily x 7 days                   -125 mg po Vancocin every other day x 7 days                   -  125 mg po Vancocin every 3 days x 14 days  -Enteric precautions   PPx: protonix,   Disposition: home  Subjective:  Pain better controlled. Patient only feels pain right at the end of her oxycodone dose. Tolerating Po. Able to walk with no difficulty. No BM.  Objective: Temp:  [97.8 F (36.6 C)-98.1 F (36.7 C)] 97.8 F (36.6 C) (07/04 0622) Pulse Rate:  [62-76] 62 (07/04 0835) Resp:  [17-18] 17 (07/04 0622) BP: (121-140)/(53-70) 140/67 (07/04 0835) SpO2:  [97 %-100 %] 97 % (07/04 0622) Weight:  [112 lb 14.4 oz (51.2 kg)] 112 lb 14.4 oz (51.2 kg) (07/03 2256)   Physical Exam: General: alert, oriented x4. No  acute distress. Cardiovascular: rrr, no rubs, gallops murmurs. Cap refill <2seconds Respiratory: LCTAB, no increased work of breathing Abdomen: soft, tender in RUQ. No rebound, no peritonitis. Extremities: Able to move all 4 extremities. Some residual weakness in LLE. Neuro: Alert, oriented x4. Psych: appropriately answers questions  Laboratory:  Recent Labs Lab 09/09/16 0644 09/10/16 0538 09/11/16 0359  WBC 5.0 4.9 7.1  HGB 11.7* 11.7* 11.3*  HCT 36.0 36.4 35.1*  PLT 323 347 364    Recent Labs Lab 09/09/16 0644 09/10/16 0538 09/11/16 0359  NA 142 142 141  K 3.9 4.8 3.5  CL 114* 110 109  CO2 _0 BUN _1 CREATININE 0.55 0.62 0.88  CALCIUM 8.6* 9.2 8.8*  PROT 5.6* 5.7* 5.5*  BILITOT 0.7 0.6 0.3  ALKPHOS 167* 144* 139*  ALT 354* 206* 137*  AST 369* 92* 39  GLUCOSE 85 72 103*      Imaging/Diagnostic Tests: CLINICAL DATA:  Abdominal pain, diarrhea  EXAM: CT ABDOMEN AND PELVIS WITH CONTRAST  TECHNIQUE: Multidetector CT imaging of the abdomen and pelvis was performed using the standard protocol following bolus administration of intravenous contrast.  CONTRAST:  167m ISOVUE-300 IOPAMIDOL (ISOVUE-300) INJECTION 61%  COMPARISON:  07/13/2016  FINDINGS: Lower chest: Lung bases are clear. No effusions. Heart is normal size.  Hepatobiliary: There is intrahepatic and extrahepatic biliary ductal dilatation. Common bile duct measures 10 mm. Prior cholecystectomy.  Pancreas: Mild pancreatic ductal dilatation. Two small cysts within the pancreas, with a 7 mm cyst in the pancreatic head and 6 mm cyst in the pancreatic body. These are unchanged since recent study.  Spleen: No focal abnormality or ductal dilatation.  Adrenals/Urinary Tract: Bilateral renal cysts again noted, stable. No hydronephrosis. Adrenal glands and urinary bladder unremarkable.  Stomach/Bowel: Stomach, large and small bowel grossly unremarkable.  Vascular/Lymphatic:  Scattered aortic calcifications. No aneurysm or adenopathy.  Reproductive: Prior hysterectomy.  No adnexal masses.  Other: No free fluid or free air.  Musculoskeletal: No acute bony abnormality. Degenerative disc disease at L4-5.  IMPRESSION: Intrahepatic and extrahepatic biliary ductal dilatation. Pancreatic duct slightly prominent. There are no visible obstructing masses or biliary ductal stones. Small cysts noted in the pancreatic head and body. If further evaluation is felt warranted, MRCP may be beneficial.  Bilateral renal cysts.  No hydronephrosis.  CLINICAL DATA:  Elevated liver function tests.  Pancreatitis.  EXAM: MRI ABDOMEN WITHOUT AND WITH CONTRAST (INCLUDING MRCP)  TECHNIQUE: Multiplanar multisequence MR imaging of the abdomen was performed both before and after the administration of intravenous contrast. Heavily T2-weighted images of the biliary and pancreatic ducts were obtained, and three-dimensional MRCP images were rendered by post processing.  CONTRAST:  163mMULTIHANCE GADOBENATE DIMEGLUMINE 529 MG/ML IV SOLN  COMPARISON:  CT of 1 day prior  FINDINGS: Portions of  the dedicated MRCP series are motion degraded.  Lower chest: Mild cardiomegaly, without pericardial or pleural effusion.  Hepatobiliary: No suspicious liver lesion. Status post cholecystectomy. Intrahepatic ducts are upper normal after cholecystectomy. The common duct is also upper normal, including at 10 mm on image 21/ series 5, image 44/series 8. Given mild limitations of MRCP series, no evidence of choledocholithiasis.  Pancreas: No pancreatic duct dilatation or acute pancreatitis. Multiple simple appearing cystic foci are identified within the pancreas, including lesions within the body, head, and uncinate process. The largest is within the pancreatic head at 7 mm on image 23/series 5. No post-contrast enhancement.  Spleen:  Normal in size, without focal  abnormality.  Adrenals/Urinary Tract: Normal adrenal glands. In both kidneys there are precontrast T1 hyperintense lesions. The largest is within the upper pole left kidney at 11 mm on image 25/series 1500. None of these lesions demonstrate postcontrast enhancement, including on subtracted images. There are also too small to characterize renal lesions. No hydronephrosis.  Stomach/Bowel: Proximal gastric underdistention. Apparent wall thickening could be secondary. Normal large and small bowel loops.  Vascular/Lymphatic: Aortic atherosclerosis. No retroperitoneal or retrocrural adenopathy.  Other:  No ascites.  Musculoskeletal: No acute osseous abnormality.  IMPRESSION: 1. Cholecystectomy with upper normal intra and extrahepatic biliary ducts. No cause identified. 2. Subcentimeter cystic lesions within the pancreas. Most likely pseudocysts. Indolent cystic neoplasms could look similar. Per consensus criteria, these warrant followup with pre and post contrast abdominal MRI/MRCP at 2 years. This recommendation follows ACR consensus guidelines: Management of Incidental Pancreatic Cysts: A White Paper of the ACR Incidental Findings Committee. J Am Coll Radiol 6387;56:433-295. 3. Bilateral renal cysts and complex cysts. No enhancing renal lesion.  Guadalupe Dawn, M.D. 09/11/2016, 9:24 AM PGY-1, [place] Intern pager: [phone], text pages welcome

## 2016-09-11 NOTE — Discharge Instructions (Signed)
You are being discharged with pain medications, robaxin, and vancomycin. You will take the pain medications every 6 hours. Please follow up with your pcp within 1-2 weeks for reevaluation of your abdominal pain. You are also to take the muscle relaxer for your neck pain every 8 hours. Please discuss this with your pcp at your follow up. You are being discharged on a vancomycin taper. The schedule for this taper is as follws: Vancomycin 500mg  every 6 hours for 2 weeks Vancomycin 125mg  every 12 hours for 1 week Vancomycin 125mg  daily for 1 week Vancomycin 125mg  every other day for 1 week Vancomycin 125mg  every 3 days for 2 weeks You had an MRCP performed at this admission. It found small subcentimeter lesions on your pancrease. Gastroenterology recommends that you have a repeat MRCP done in 1-2 years to rule out cancer. Please discuss this with your pcp. It is imperative that your use this medication and stick to the schedule to treat and prevent recurrence of your C. Diff. You will need to take stool softeners while you are taking the opiates to prevent constipation

## 2016-09-11 NOTE — Discharge Summary (Signed)
[facility] Discharge Summary  Patient name: [name] Medical record number: [id number] Date of birth: [birth year] Age: 66 y.o. Gender: female Date of Admission: 09/08/2016  Date of Discharge: 7/4 Admitting Physician: Lind Covert, MD  Primary Care Provider: [name], MD Consultants: GI, ID  Indication for Hospitalization: Right upper quadrant pain, C. Diff Colitis  Discharge Diagnoses/Problem List:  Acute Pancreatitis Acetaminophen induced Hepatitis Pancreatic Pseudocysts Headache/Neck pain C. Diff Colitis  Disposition: home  Discharge Condition: good  Discharge Exam:  General: alert, oriented x4. No acute distress. Cardiovascular: rrr, no rubs, gallops murmurs. Cap refill <2seconds Respiratory: LCTAB, no increased work of breathing Abdomen: soft, tender in RUQ. No rebound, no peritonitis. Extremities: Able to move all 4 extremities. Some residual weakness in LLE. Neuro: Alert, oriented x4. Psych: appropriately answers questions  Brief Hospital Course:  Patient presented to the [address] ed with a 2 week history of right upper quadrant abdominal pain, 5 month history of neck stiffness, and 5 month history of c. Diff colitis. She had been having this sharp mid-epigastric pain that radiated to RUQ and Right flank. Prior to presentation she had been taking acetaminophen every 6 hours for 4 weeks. On admission she was found to have normal LFTs and a lipase in 300s. She was admitted, started on fluids, and made NPO. She had a CT scan which showed biliary system dilatation and possible subcentimeter pseudocysts in pancrease. On HD 1  her LFTs increased greatly up into the 300s. Her LFTs continued to downtrend throughout her hospital admission, normalizing on day of discharge. ON HD1 She underwent MCRP which revealed no findings of bile duct stone, and again showed the small pseudocysts vs cystic neoplasm.  For her neck and spine pain  she had a known diagnosis of DDD. This was confirmed on spinal xray. She was started on robaxin which brought improvement of these symptoms.  Regarding her history of failed c diff colitis treatment. Infectious disease was consulted. They felt like her small response to treatment resulted from poor compliance rather than medication failure. They recommended continuing outpatient treatment with vancomycin.  Issues for Follow Up:  1. F/U 1-2 years with MRCP to evaluate for cystic neoplasm  Significant Procedures: MRCP  Significant Labs and Imaging:   Recent Labs Lab 09/09/16 0644 09/10/16 0538 09/11/16 0359  WBC 5.0 4.9 7.1  HGB 11.7* 11.7* 11.3*  HCT 36.0 36.4 35.1*  PLT 323 347 364    Recent Labs Lab 09/08/16 1231 09/09/16 0644 09/10/16 0538 09/11/16 0359  NA 142 142 142 141  K 3.4* 3.9 4.8 3.5  CL 111 114* 110 109  CO2 26 23 25 26   GLUCOSE 98 85 72 103*  BUN 10 10 9 18   CREATININE 0.70 0.55 0.62 0.88  CALCIUM 9.4 8.6* 9.2 8.8*  ALKPHOS 102 167* 144* 139*  AST 15 369* 92* 39  ALT 12* 354* 206* 137*  ALBUMIN 3.8 3.4* 3.5 3.3*     Results/Tests Pending at Time of Discharge: none  Discharge Medications:  Allergies as of 09/11/2016      Reactions   Codeine Nausea And Vomiting   Nsaids Other (See Comments)   Stomach ulcers      Medication List    STOP taking these medications   acetaminophen 500 MG tablet Commonly known as:  TYLENOL     TAKE these medications   albuterol 108 (90 Base) MCG/ACT inhaler Commonly known as:  PROVENTIL HFA;VENTOLIN HFA Inhale 1-2 puffs into the  lungs every 4 (four) hours as needed for wheezing or shortness of breath.   carisoprodol 350 MG tablet Commonly known as:  SOMA Take 1 tablet (350 mg total) by mouth 3 (three) times daily as needed. for muscle spams Refill only until pt sees neuro 09/25/2016 What changed:  when to take this  additional instructions   carvedilol 6.25 MG tablet Commonly known as:  COREG Take 1 tablet  (6.25 mg total) by mouth 2 (two) times daily with a meal.   fluticasone 44 MCG/ACT inhaler Commonly known as:  FLOVENT HFA Inhale 2 puffs into the lungs 2 (two) times daily.   methocarbamol 500 MG tablet Commonly known as:  ROBAXIN Take 1 tablet (500 mg total) by mouth 4 (four) times daily.   oxyCODONE 5 MG immediate release tablet Commonly known as:  Oxy IR/ROXICODONE Take 1 tablet (5 mg total) by mouth every 6 (six) hours as needed for moderate pain (for pain scale 4-6).   pantoprazole 40 MG tablet Commonly known as:  PROTONIX Take 40 mg by mouth daily.   pregabalin 50 MG capsule Commonly known as:  LYRICA Take 50 mg by mouth 2 (two) times daily.   vancomycin 125 MG capsule Commonly known as:  VANCOCIN HCL Take 1 capsule (125 mg total) by mouth 4 (four) times daily. What changed:  how much to take  how to take this  when to take this  additional instructions   vancomycin 250 MG capsule Commonly known as:  VANCOCIN HCL Take 1 capsule (250 mg total) by mouth 4 (four) times daily. What changed:  You were already taking a medication with the same name, and this prescription was added. Make sure you understand how and when to take each.       Discharge Instructions: Please refer to Patient Instructions section of EMR for full details.  Patient was counseled important signs and symptoms that should prompt return to medical care, changes in medications, dietary instructions, activity restrictions, and follow up appointments.   Follow-Up Appointments:   Guadalupe Dawn MD 09/11/2016, 3:27 PM PGY-1, [place]

## 2016-09-11 NOTE — Progress Notes (Signed)
[facility] for Infectious Disease  Date of Admission:  09/08/2016    Total days of antibiotics 2         Vancomycin Day 2         ASSESSMENT: Ms. Giddens 65 year old lady with history significant for prolonged antibiotic use in the recent past and recurrent C. difficile colitis. C. difficile colitis. Ms. Hainsworth seems improving, she did not had any bowel movement for last 2-3 days. Still having right upper and lower quadrant pain. Liver enzymes are improving, lipase has been normalized. She was started on high-dose vancomycin yesterday. She will need a bowel regimen as she is using opioids for her pain.  PLAN: 1. Continue vancomycin at 500 mg every 6 hourly for first 2 weeks. Followed by -125 mg po Vancocin 2x daily x 7 days -125 mg po Vancocin daily x 7 days -125 mg po Vancocin every other day x 7 days -125 mg po Vancocin every 3 days x 14 days   2. No PPI or Pepcid, unless patient has worsening GERD symptoms.  Principal Problem:   C. difficile colitis Active Problems:   Abdominal pain   . carisoprodol  350 mg Oral TID  . carvedilol  6.25 mg Oral BID WC  . enoxaparin (LOVENOX) injection  40 mg Subcutaneous Q24H  . methocarbamol  500 mg Oral QID  . pregabalin  50 mg Oral BID  . vancomycin  500 mg Oral QID   Followed by  . [START ON 09/24/2016] vancomycin  125 mg Oral BID   Followed by  . [START ON 10/01/2016] vancomycin  125 mg Oral Daily   Followed by  . [START ON 10/08/2016] vancomycin  125 mg Oral QODAY   Followed by  . [START ON 10/16/2016] vancomycin  125 mg Oral Q3 days    SUBJECTIVE: Ms. Gilani was feeling better, did not had any bowel movement, still having right upper and lower quadrant pain. She denies any nausea or vomiting, appetite is improving and she was able to tolerate her meals well.  Review of Systems: ROS  Allergies  Allergen Reactions  . Codeine Nausea And Vomiting  . Nsaids Other (See Comments)    Stomach ulcers     OBJECTIVE: Vitals:   09/10/16 2256 09/11/16 0622 09/11/16 0835 09/11/16 1110  BP: (!) 130/57 (!) 121/53 140/67 (!) 142/66  Pulse: 69 66 62 69  Resp: 18 17  16   Temp: 98.1 F (36.7 C) 97.8 F (36.6 C)  97.9 F (36.6 C)  TempSrc: Oral Oral  Oral  SpO2: 97% 97%  98%  Weight: 112 lb 14.4 oz (51.2 kg)     Height:       Body mass index is 19.38 kg/m.  Physical Exam   Gen. Well-developed lady, in no acute distress. Abdomen. Mild diffused tenderness more pronounced in epigastrium and along with right abdominal wall, bowel sounds positive, no distention or guarding. Chest. Clear bilaterally. CV. Regular rate and rhythm. Extremities. No edema, no cyanosis, pulses intact and symmetrical bilaterally.  Lab Results Lab Results  Component Value Date   WBC 7.1 09/11/2016   HGB 11.3 (L) 09/11/2016   HCT 35.1 (L) 09/11/2016   MCV 94.4 09/11/2016   PLT 364 09/11/2016    Lab Results  Component Value Date   CREATININE 0.88 09/11/2016   BUN 18 09/11/2016   NA 141 09/11/2016   K 3.5 09/11/2016   CL 109 09/11/2016   CO2 26 09/11/2016  Lab Results  Component Value Date   ALT 137 (H) 09/11/2016   AST 39 09/11/2016   ALKPHOS 139 (H) 09/11/2016   BILITOT 0.3 09/11/2016     Microbiology: Recent Results (from the past 240 hour(s))  C difficile quick scan w PCR reflex     Status: Abnormal   Collection Time: 09/10/16  1:38 AM  Result Value Ref Range Status   C Diff antigen POSITIVE (A) NEGATIVE Final   C Diff toxin POSITIVE (A) NEGATIVE Final   C Diff interpretation Toxin producing C. difficile detected.  Final    Comment: CRITICAL RESULT CALLED TO, READ BACK BY AND VERIFIED WITH: Marta Antu RN 10:00 09/11/16 ([email or web address])     Lorella Nimrod, MD [facility] [phone] pager   [phone] cell 09/11/2016, 11:51 AM

## 2016-09-11 NOTE — Progress Notes (Signed)
Cross cover [facility] Subjective: Patient seems to be doing relatively better today. She has not had a bowel movement. She still has some right upper quadrant pain and is requesting more pain medication for this.  RequestingVital signs in last 24 hours: Temp:  [97.8 F (36.6 C)-98.1 F (36.7 C)] 97.8 F (36.6 C) (07/04 0622) Pulse Rate:  [62-76] 62 (07/04 0835) Resp:  [17-18] 17 (07/04 0622) BP: (121-140)/(53-70) 140/67 (07/04 0835) SpO2:  [97 %-100 %] 97 % (07/04 0622) Weight:  [51.2 kg (112 lb 14.4 oz)] 51.2 kg (112 lb 14.4 oz) (07/03 2256) Last BM Date: 09/08/16  Intake/Output from previous day: 07/03 0701 - 07/04 0700 In: 840 [P.O.:840] Out: 975 [Urine:975] Intake/Output this shift: No intake/output data recorded.  General appearance: alert, cooperative, appears older than stated age, fatigued, no distress and pale Resp: clear to auscultation bilaterally Cardio: regular rate and rhythm, S1, S2 normal, no murmur, click, rub or gallop GI: soft, non-tender; bowel sounds normal; no masses,  no organomegaly Extremities: extremities normal, atraumatic, no cyanosis or edema  Lab Results:  Recent Labs  09/09/16 0644 09/10/16 0538 09/11/16 0359  WBC 5.0 4.9 7.1  HGB 11.7* 11.7* 11.3*  HCT 36.0 36.4 35.1*  PLT 323 347 364   BMET  Recent Labs  09/09/16 0644 09/10/16 0538 09/11/16 0359  NA 142 142 141  K 3.9 4.8 3.5  CL 114* 110 109  CO2 23 25 26   GLUCOSE 85 72 103*  BUN 10 9 18   CREATININE 0.55 0.62 0.88  CALCIUM 8.6* 9.2 8.8*   LFT  Recent Labs  09/11/16 0359  PROT 5.5*  ALBUMIN 3.3*  AST 39  ALT 137*  ALKPHOS 139*  BILITOT 0.3   PT/INR No results for input(s): LABPROT, INR in the last 72 hours. Hepatitis Panel  Recent Labs  09/09/16 1240  HEPBSAG Negative  HCVAB <0.1  HEPAIGM Negative  HEPBIGM Negative   C-Diff  Recent Labs  09/10/16 0138  CDIFFTOX POSITIVE*   No results for input(s): CDIFFPCR in the last 72 hours. Fecal  Lactopherrin No results for input(s): FECLLACTOFRN in the last 72 hours.  Studies/Results: Dg Cervical Spine Complete  Result Date: 09/09/2016 CLINICAL DATA:  Persistent neck pain and stiffness for 2 months following diagnosis of meningitis. No known injury. EXAM: CERVICAL SPINE - COMPLETE 4+ VIEW COMPARISON:  Neck CT scan dated June 22, 2016 FINDINGS: There is reversal of the normal cervical lordosis centered at C5-6. Here there is moderate disc space narrowing at approximately 2 mm of anterolisthesis of C5 with respect to C6. There is no compression fracture. There is mild multilevel facet joint hypertrophy. The oblique views reveal mild bony encroachment upon the neural foramina bilaterally in the mid cervical spine. The spinous processes and odontoid are intact. The prevertebral soft tissue spaces are normal. IMPRESSION: Moderate degenerative disc disease centered at C5-6 with milder changes at C6-7. Multilevel facet joint hypertrophy. No acute compression fracture. Further evaluation with MRI may be a useful next imaging step. Electronically Signed   By: David  Martinique M.D.   On: 09/09/2016 12:31   Mr 3d Recon At Scanner  Result Date: 09/09/2016 CLINICAL DATA:  Elevated liver function tests.  Pancreatitis. EXAM: MRI ABDOMEN WITHOUT AND WITH CONTRAST (INCLUDING MRCP) TECHNIQUE: Multiplanar multisequence MR imaging of the abdomen was performed both before and after the administration of intravenous contrast. Heavily T2-weighted images of the biliary and pancreatic ducts were obtained, and three-dimensional MRCP images were rendered by post processing. CONTRAST:  43mL MULTIHANCE  GADOBENATE DIMEGLUMINE 529 MG/ML IV SOLN COMPARISON:  CT of 1 day prior FINDINGS: Portions of the dedicated MRCP series are motion degraded. Lower chest: Mild cardiomegaly, without pericardial or pleural effusion. Hepatobiliary: No suspicious liver lesion. Status post cholecystectomy. Intrahepatic ducts are upper normal after  cholecystectomy. The common duct is also upper normal, including at 10 mm on image 21/ series 5, image 44/series 8. Given mild limitations of MRCP series, no evidence of choledocholithiasis. Pancreas: No pancreatic duct dilatation or acute pancreatitis. Multiple simple appearing cystic foci are identified within the pancreas, including lesions within the body, head, and uncinate process. The largest is within the pancreatic head at 7 mm on image 23/series 5. No post-contrast enhancement. Spleen:  Normal in size, without focal abnormality. Adrenals/Urinary Tract: Normal adrenal glands. In both kidneys there are precontrast T1 hyperintense lesions. The largest is within the upper pole left kidney at 11 mm on image 25/series 1500. None of these lesions demonstrate postcontrast enhancement, including on subtracted images. There are also too small to characterize renal lesions. No hydronephrosis. Stomach/Bowel: Proximal gastric underdistention. Apparent wall thickening could be secondary. Normal large and small bowel loops. Vascular/Lymphatic: Aortic atherosclerosis. No retroperitoneal or retrocrural adenopathy. Other:  No ascites. Musculoskeletal: No acute osseous abnormality. IMPRESSION: 1. Cholecystectomy with upper normal intra and extrahepatic biliary ducts. No cause identified. 2. Subcentimeter cystic lesions within the pancreas. Most likely pseudocysts. Indolent cystic neoplasms could look similar. Per consensus criteria, these warrant followup with pre and post contrast abdominal MRI/MRCP at 2 years. This recommendation follows ACR consensus guidelines: Management of Incidental Pancreatic Cysts: A White Paper of the ACR Incidental Findings Committee. J Am Coll Radiol 5366;44:034-742. 3. Bilateral renal cysts and complex cysts. No enhancing renal lesion. Electronically Signed   By: Abigail Miyamoto M.D.   On: 09/09/2016 16:56   Mr Abdomen Mrcp Moise Boring Contast  Result Date: 09/09/2016 CLINICAL DATA:  Elevated liver  function tests.  Pancreatitis. EXAM: MRI ABDOMEN WITHOUT AND WITH CONTRAST (INCLUDING MRCP) TECHNIQUE: Multiplanar multisequence MR imaging of the abdomen was performed both before and after the administration of intravenous contrast. Heavily T2-weighted images of the biliary and pancreatic ducts were obtained, and three-dimensional MRCP images were rendered by post processing. CONTRAST:  71mL MULTIHANCE GADOBENATE DIMEGLUMINE 529 MG/ML IV SOLN COMPARISON:  CT of 1 day prior FINDINGS: Portions of the dedicated MRCP series are motion degraded. Lower chest: Mild cardiomegaly, without pericardial or pleural effusion. Hepatobiliary: No suspicious liver lesion. Status post cholecystectomy. Intrahepatic ducts are upper normal after cholecystectomy. The common duct is also upper normal, including at 10 mm on image 21/ series 5, image 44/series 8. Given mild limitations of MRCP series, no evidence of choledocholithiasis. Pancreas: No pancreatic duct dilatation or acute pancreatitis. Multiple simple appearing cystic foci are identified within the pancreas, including lesions within the body, head, and uncinate process. The largest is within the pancreatic head at 7 mm on image 23/series 5. No post-contrast enhancement. Spleen:  Normal in size, without focal abnormality. Adrenals/Urinary Tract: Normal adrenal glands. In both kidneys there are precontrast T1 hyperintense lesions. The largest is within the upper pole left kidney at 11 mm on image 25/series 1500. None of these lesions demonstrate postcontrast enhancement, including on subtracted images. There are also too small to characterize renal lesions. No hydronephrosis. Stomach/Bowel: Proximal gastric underdistention. Apparent wall thickening could be secondary. Normal large and small bowel loops. Vascular/Lymphatic: Aortic atherosclerosis. No retroperitoneal or retrocrural adenopathy. Other:  No ascites. Musculoskeletal: No acute osseous abnormality. IMPRESSION: 1.  Cholecystectomy with upper normal intra and extrahepatic biliary ducts. No cause identified. 2. Subcentimeter cystic lesions within the pancreas. Most likely pseudocysts. Indolent cystic neoplasms could look similar. Per consensus criteria, these warrant followup with pre and post contrast abdominal MRI/MRCP at 2 years. This recommendation follows ACR consensus guidelines: Management of Incidental Pancreatic Cysts: A White Paper of the ACR Incidental Findings Committee. J Am Coll Radiol 5686;16:837-290. 3. Bilateral renal cysts and complex cysts. No enhancing renal lesion. Electronically Signed   By: Abigail Miyamoto M.D.   On: 09/09/2016 16:56    Medications: I have reviewed the patient's current medications.  Assessment/Plan: 1) Clostridium difficile colitis responding appropriately to Vancomycin. We will continue the taper as outlined by the ID service.  2) Abnormal LFTs seem to be improving off the Tylenol has been stopped. 3) Right upper quadrant pain of unclear etiology.  4) pancreatic pseudocyst in the recent scan.  LOS: 3 days   Johaan Ryser 09/11/2016, 9:45 AM

## 2016-09-12 NOTE — Progress Notes (Signed)
LATE ENTRY Patient discharged 7/4 to home, AVS was reviewed and prescriptions provided. Vanc schedule reviewed in detail and written out with teach back. Pharmacy called and MD paged to change robaxin prescription. IV's removed, patient left floor ambulatory with family member.

## 2016-09-25 ENCOUNTER — Encounter: Payer: Self-pay | Admitting: Neurology

## 2016-09-25 ENCOUNTER — Telehealth: Payer: Self-pay | Admitting: Neurology

## 2016-09-25 ENCOUNTER — Encounter (INDEPENDENT_AMBULATORY_CARE_PROVIDER_SITE_OTHER): Payer: Self-pay

## 2016-09-25 ENCOUNTER — Other Ambulatory Visit: Payer: Self-pay | Admitting: Neurology

## 2016-09-25 ENCOUNTER — Ambulatory Visit (INDEPENDENT_AMBULATORY_CARE_PROVIDER_SITE_OTHER): Payer: Medicare Other | Admitting: Neurology

## 2016-09-25 VITALS — BP 139/76 | HR 88 | Ht 64.0 in | Wt 110.0 lb

## 2016-09-25 DIAGNOSIS — G243 Spasmodic torticollis: Secondary | ICD-10-CM

## 2016-09-25 DIAGNOSIS — I639 Cerebral infarction, unspecified: Secondary | ICD-10-CM | POA: Diagnosis not present

## 2016-09-25 DIAGNOSIS — R29898 Other symptoms and signs involving the musculoskeletal system: Secondary | ICD-10-CM | POA: Diagnosis not present

## 2016-09-25 DIAGNOSIS — M5412 Radiculopathy, cervical region: Secondary | ICD-10-CM

## 2016-09-25 MED ORDER — CYCLOBENZAPRINE HCL 10 MG PO TABS: 10.0000 mg | ORAL_TABLET | Freq: Three times a day (TID) | ORAL | 3 refills | Status: DC | PRN

## 2016-09-25 MED ORDER — TRAMADOL HCL 50 MG PO TABS: 100.0000 mg | ORAL_TABLET | Freq: Four times a day (QID) | ORAL | 5 refills | Status: DC | PRN

## 2016-09-25 NOTE — Telephone Encounter (Addendum)
Called pharmacy and cancelled further refills of tramadol per Dr. Jaynee Eagles since medication is not helping and pt is having side effects. Returned call and spoke to pt's spouse. Encouraged him to have pt continue Flexeril (up to 3 times per day) that was just started this morning in addition to Lyrica that pt has prescribed. Husband reports that pt "is crying and none of the meds are helping. The only time that she got relief was when she received Soma in the hospital." Per [place] controlled substance registry, pt filled a Soma rx from Dr. Carlota Raspberry on 09/06/16 for #30.

## 2016-09-25 NOTE — Patient Instructions (Signed)
Remember to drink plenty of fluid, eat healthy meals and do not skip any meals. Try to eat protein with a every meal and eat a healthy snack such as fruit or nuts in between meals. Try to keep a regular sleep-wake schedule and try to exercise daily, particularly in the form of walking, 20-30 minutes a day, if you can.   As far as your medications are concerned, I would like to suggest:  Flexeril 10mg  three times a day Tramadol 50-100mg  (1-2 tabs) up to 4x a day  As far as diagnostic testing: MRI cervical spine, labs  I would like to see you back for Dr. Krista Blue, sooner if we need to. Please call us with any interim questions, concerns, problems, updates or refill requests.   Our phone number is [phone]. We also have an after hours call service for urgent matters and there is a physician on-call for urgent questions. For any emergencies you know to call 911 or go to the nearest emergency room  Tramadol tablets What is this medicine? TRAMADOL (TRA ma dole) is a pain reliever. It is used to treat moderate to severe pain in adults. This medicine may be used for other purposes; ask your health care provider or pharmacist if you have questions. COMMON BRAND NAME(S): Ultram What should I tell my health care provider before I take this medicine? They need to know if you have any of these conditions: -brain tumor -depression -drug abuse or addiction -head injury -if you frequently drink alcohol containing drinks -kidney disease or trouble passing urine -liver disease -lung disease, asthma, or breathing problems -seizures or epilepsy -suicidal thoughts, plans, or attempt; a previous suicide attempt by you or a family member -an unusual or allergic reaction to tramadol, codeine, other medicines, foods, dyes, or preservatives -pregnant or trying to get pregnant -breast-feeding How should I use this medicine? Take this medicine by mouth with a full glass of water. Follow the directions on the  prescription label. You can take it with or without food. If it upsets your stomach, take it with food. Do not take your medicine more often than directed. A special MedGuide will be given to you by the pharmacist with each prescription and refill. Be sure to read this information carefully each time. Talk to your pediatrician regarding the use of this medicine in children. Special care may be needed. Overdosage: If you think you have taken too much of this medicine contact a poison control center or emergency room at once. NOTE: This medicine is only for you. Do not share this medicine with others. What if I miss a dose? If you miss a dose, take it as soon as you can. If it is almost time for your next dose, take only that dose. Do not take double or extra doses. What may interact with this medicine? Do not take this medication with any of the following medicines: -MAOIs like Carbex, Eldepryl, Marplan, Nardil, and Parnate This medicine may also interact with the following medications: -alcohol -antihistamines for allergy, cough and cold -certain medicines for anxiety or sleep -certain medicines for depression like amitriptyline, fluoxetine, sertraline -certain medicines for migraine headache like almotriptan, eletriptan, frovatriptan, naratriptan, rizatriptan, sumatriptan, zolmitriptan -certain medicines for seizures like carbamazepine, oxcarbazepine, phenobarbital, primidone -certain medicines that treat or prevent blood clots like warfarin -digoxin -furazolidone -general anesthetics like halothane, isoflurane, methoxyflurane, propofol -linezolid -local anesthetics like lidocaine, pramoxine, tetracaine -medicines that relax muscles for surgery -other narcotic medicines for pain or cough -phenothiazines like chlorpromazine,  mesoridazine, prochlorperazine, thioridazine -procarbazine This list may not describe all possible interactions. Give your health care provider a list of all the  medicines, herbs, non-prescription drugs, or dietary supplements you use. Also tell them if you smoke, drink alcohol, or use illegal drugs. Some items may interact with your medicine. What should I watch for while using this medicine? Tell your doctor or health care professional if your pain does not go away, if it gets worse, or if you have new or a different type of pain. You may develop tolerance to the medicine. Tolerance means that you will need a higher dose of the medicine for pain relief. Tolerance is normal and is expected if you take this medicine for a long time. Do not suddenly stop taking your medicine because you may develop a severe reaction. Your body becomes used to the medicine. This does NOT mean you are addicted. Addiction is a behavior related to getting and using a drug for a non-medical reason. If you have pain, you have a medical reason to take pain medicine. Your doctor will tell you how much medicine to take. If your doctor wants you to stop the medicine, the dose will be slowly lowered over time to avoid any side effects. There are different types of narcotic medicines (opiates). If you take more than one type at the same time or if you are taking another medicine that also causes drowsiness, you may have more side effects. Give your health care provider a list of all medicines you use. Your doctor will tell you how much medicine to take. Do not take more medicine than directed. Call emergency for help if you have problems breathing or unusual sleepiness. You may get drowsy or dizzy. Do not drive, use machinery, or do anything that needs mental alertness until you know how this medicine affects you. Do not stand or sit up quickly, especially if you are an older patient. This reduces the risk of dizzy or fainting spells. Alcohol can increase or decrease the effects of this medicine. Avoid alcoholic drinks. You may have constipation. Try to have a bowel movement at least every 2 to 3  days. If you do not have a bowel movement for 3 days, call your doctor or health care professional. Your mouth may get dry. Chewing sugarless gum or sucking hard candy, and drinking plenty of water may help. Contact your doctor if the problem does not go away or is severe. What side effects may I notice from receiving this medicine? Side effects that you should report to your doctor or health care professional as soon as possible: -allergic reactions like skin rash, itching or hives, swelling of the face, lips, or tongue -breathing problems -confusion -seizures -signs and symptoms of low blood pressure like dizziness; feeling faint or lightheaded, falls; unusually weak or tired -trouble passing urine or change in the amount of urine Side effects that usually do not require medical attention (report to your doctor or health care professional if they continue or are bothersome): -constipation -dry mouth -nausea, vomiting -tiredness This list may not describe all possible side effects. Call your doctor for medical advice about side effects. You may report side effects to FDA at 1-800-FDA-1088. Where should I keep my medicine? Keep out of the reach of children. This medicine may cause accidental overdose and death if it taken by other adults, children, or pets. Mix any unused medicine with a substance like cat litter or coffee grounds. Then throw the medicine away in  a sealed container like a sealed bag or a coffee can with a lid. Do not use the medicine after the expiration date. Store at room temperature between 15 and 30 degrees C (59 and 86 degrees F). NOTE: This sheet is a summary. It may not cover all possible information. If you have questions about this medicine, talk to your doctor, pharmacist, or health care provider.  2018 Elsevier/Gold Standard (2014-11-20 09:00:04)   Cyclobenzaprine tablets What is this medicine? CYCLOBENZAPRINE (sye kloe BEN za preen) is a muscle relaxer. It is  used to treat muscle pain, spasms, and stiffness. This medicine may be used for other purposes; ask your health care provider or pharmacist if you have questions. COMMON BRAND NAME(S): Fexmid, Flexeril What should I tell my health care provider before I take this medicine? They need to know if you have any of these conditions: -heart disease, irregular heartbeat, or previous heart attack -liver disease -thyroid problem -an unusual or allergic reaction to cyclobenzaprine, tricyclic antidepressants, lactose, other medicines, foods, dyes, or preservatives -pregnant or trying to get pregnant -breast-feeding How should I use this medicine? Take this medicine by mouth with a glass of water. Follow the directions on the prescription label. If this medicine upsets your stomach, take it with food or milk. Take your medicine at regular intervals. Do not take it more often than directed. Talk to your pediatrician regarding the use of this medicine in children. Special care may be needed. Overdosage: If you think you have taken too much of this medicine contact a poison control center or emergency room at once. NOTE: This medicine is only for you. Do not share this medicine with others. What if I miss a dose? If you miss a dose, take it as soon as you can. If it is almost time for your next dose, take only that dose. Do not take double or extra doses. What may interact with this medicine? Do not take this medicine with any of the following medications: -certain medicines for fungal infections like fluconazole, itraconazole, ketoconazole, posaconazole, voriconazole -cisapride -dofetilide -dronedarone -halofantrine -levomethadyl -MAOIs like Carbex, Eldepryl, Marplan, Nardil, and Parnate -narcotic medicines for cough -pimozide -thioridazine -ziprasidone This medicine may also interact with the following medications: -alcohol -antihistamines for allergy, cough and cold -certain medicines for  anxiety or sleep -certain medicines for cancer -certain medicines for depression like amitriptyline, fluoxetine, sertraline -certain medicines for infection like alfuzosin, chloroquine, clarithromycin, levofloxacin, mefloquine, pentamidine, troleandomycin -certain medicines for irregular heart beat -certain medicines for seizures like phenobarbital, primidone -contrast dyes -general anesthetics like halothane, isoflurane, methoxyflurane, propofol -local anesthetics like lidocaine, pramoxine, tetracaine -medicines that relax muscles for surgery -narcotic medicines for pain -other medicines that prolong the QT interval (cause an abnormal heart rhythm) -phenothiazines like chlorpromazine, mesoridazine, prochlorperazine This list may not describe all possible interactions. Give your health care provider a list of all the medicines, herbs, non-prescription drugs, or dietary supplements you use. Also tell them if you smoke, drink alcohol, or use illegal drugs. Some items may interact with your medicine. What should I watch for while using this medicine? Tell your doctor or health care professional if your symptoms do not start to get better or if they get worse. You may get drowsy or dizzy. Do not drive, use machinery, or do anything that needs mental alertness until you know how this medicine affects you. Do not stand or sit up quickly, especially if you are an older patient. This reduces the risk of dizzy or fainting spells.  Alcohol may interfere with the effect of this medicine. Avoid alcoholic drinks. If you are taking another medicine that also causes drowsiness, you may have more side effects. Give your health care provider a list of all medicines you use. Your doctor will tell you how much medicine to take. Do not take more medicine than directed. Call emergency for help if you have problems breathing or unusual sleepiness. Your mouth may get dry. Chewing sugarless gum or sucking hard candy, and  drinking plenty of water may help. Contact your doctor if the problem does not go away or is severe. What side effects may I notice from receiving this medicine? Side effects that you should report to your doctor or health care professional as soon as possible: -allergic reactions like skin rash, itching or hives, swelling of the face, lips, or tongue -breathing problems -chest pain -fast, irregular heartbeat -hallucinations -seizures -unusually weak or tired Side effects that usually do not require medical attention (report to your doctor or health care professional if they continue or are bothersome): -headache -nausea, vomiting This list may not describe all possible side effects. Call your doctor for medical advice about side effects. You may report side effects to FDA at 1-800-FDA-1088. Where should I keep my medicine? Keep out of the reach of children. Store at room temperature between 15 and 30 degrees C (59 and 86 degrees F). Keep container tightly closed. Throw away any unused medicine after the expiration date. NOTE: This sheet is a summary. It may not cover all possible information. If you have questions about this medicine, talk to your doctor, pharmacist, or health care provider.  2018 Elsevier/Gold Standard (2014-12-06 12:05:46)

## 2016-09-25 NOTE — Addendum Note (Signed)
Addended by: Monte Fantasia on: 09/25/2016 06:29 PM   Modules accepted: Orders

## 2016-09-25 NOTE — Telephone Encounter (Signed)
Patients husband Coralyn Mark called office in reference to starting traMADol (ULTRAM) 50 MG tablet.  Husband states medication is bothering her stomach, burning, and not getting any relief.  Please call

## 2016-09-25 NOTE — Telephone Encounter (Signed)
Give the [place] and flexeril time to work. If the tramadol is bothering her stomach so will other opioids thanks

## 2016-09-25 NOTE — Progress Notes (Signed)
[facility]    Provider:  Dr Jaynee Eagles Referring Provider: [name], MD Primary Care Physician:  [name], MD  CC:  Neck and back pain  HPI:  [name] is a 66 y.o. female here as a referral from Dr. Carlota Raspberry for cervical pain. PMHx of strep pneumo meningitis with septic superior sagittal venous sinus thrombosis admitted to [facility] 04/2016 for treatment,  admitted to [facility] in March for acute blood loss anemia with GI hemorrhage and anemia hemoglobin was 5. wine when admitted.. Per review of records of [facility] orthopedics, the patient presented with back complaints involving the thoracic and lumbar spine describing the pain as throbbing and 10 on a 10 and feels that the symptoms are progressive and not improving. Exacerbated by lifting, carrying, bending, sitting, standing, laying down and walking. She feels chills down her spine. She is also complaining of spasms of the back of her head and decreased range of motion which began 3 months previous without any known injury after meningitis in February. Neck is stiff, can't move neck, severe pain, pain radiates into the arms, arm weakness, she has taken so much tylenol for pain that she inflamed her liver.  Neck pain started in February with the meningitis. Since then she has spasms in the neck and decreased ROM.   Reviewed notes, labs and imaging from outside physicians, which showed:  Personally reviewed xrays and agree with the following:  IMPRESSION: Moderate degenerative disc disease centered at C5-6 with milder changes at C6-7. Multilevel facet joint hypertrophy. No acute compression fracture. Further evaluation with MRI may be a useful next imaging step.    Review of Systems: Patient complains of symptoms per HPI as well as the following symptoms: Weight loss, double vision, shortness of breath, murmur, headache, difficulty swallowing, sleepiness, anxiety, not enough sleep, change in appetite .  Pertinent negatives and positives per HPI. All others negative.   Social History   Social History  . Marital status: Married    Spouse name: N/A  . Number of children: N/A  . Years of education: N/A   Occupational History  . Not on file.   Social History Main Topics  . Smoking status: Former Smoker    Packs/day: 0.50    Years: 30.00    Types: Cigarettes  . Smokeless tobacco: Never Used  . Alcohol use No  . Drug use: No     Comment: Methadone for narcotic use  . Sexual activity: Not on file   Other Topics Concern  . Not on file   Social History Narrative  . No narrative on file    Family History  Problem Relation Age of Onset  . Alzheimer's disease Mother   . Cancer Father        prostate  . Heart disease Father     Past Medical History:  Diagnosis Date  . Arrhythmia   . Arthritis   . Cancer ([place])   . Fibromyalgia   . GERD (gastroesophageal reflux disease)   . H/O: substance abuse 2005   narcotic usage due to chronic back pain  . Heart murmur   . Hypertension   . Meningitis   . PONV (postoperative nausea and vomiting)     Past Surgical History:  Procedure Laterality Date  . ABDOMINAL HYSTERECTOMY    . ABDOMINAL SURGERY    . APPENDECTOMY    . BREAST SURGERY    . CHOLECYSTECTOMY N/A 09/30/2012   Procedure: LAPAROSCOPIC CHOLECYSTECTOMY WITH INTRAOPERATIVE CHOLANGIOGRAM;  Surgeon:  Imogene Burn. Georgette Dover, MD;  Location: [facility];  Service: General;  Laterality: N/A;  . FRACTURE SURGERY     right upper arm  . OVARIAN CYST SURGERY      Current Outpatient Prescriptions  Medication Sig Dispense Refill  . albuterol (PROVENTIL HFA;VENTOLIN HFA) 108 (90 Base) MCG/ACT inhaler Inhale 1-2 puffs into the lungs every 4 (four) hours as needed for wheezing or shortness of breath. 1 Inhaler 0  . carvedilol (COREG) 6.25 MG tablet Take 1 tablet (6.25 mg total) by mouth 2 (two) times daily with a meal. 60 tablet 3  . fluticasone (FLOVENT HFA) 44 MCG/ACT inhaler Inhale 2 puffs  into the lungs 2 (two) times daily. 1 Inhaler 3  . pantoprazole (PROTONIX) 40 MG tablet Take 40 mg by mouth daily.    . pregabalin (LYRICA) 50 MG capsule Take 50 mg by mouth 2 (two) times daily.     . vancomycin (VANCOCIN HCL) 125 MG capsule Take 1 capsule (125 mg total) by mouth 4 (four) times daily. 30 capsule 0  . vancomycin (VANCOCIN HCL) 250 MG capsule Take 1 capsule (250 mg total) by mouth 4 (four) times daily. 60 capsule 1  . cyclobenzaprine (FLEXERIL) 10 MG tablet Take 1 tablet (10 mg total) by mouth 3 (three) times daily as needed for muscle spasms. 90 tablet 3   No current facility-administered medications for this visit.     Allergies as of 09/25/2016 - Review Complete 09/25/2016  Allergen Reaction Noted  . Codeine Nausea And Vomiting 08/19/2012  . Nsaids Other (See Comments) 08/19/2012    Vitals: BP 139/76   Pulse 88   Ht 5\' 4"  (1.626 m)   Wt 110 lb (49.9 kg)   BMI 18.88 kg/m  Last Weight:  Wt Readings from Last 1 Encounters:  09/25/16 110 lb (49.9 kg)   Last Height:   Ht Readings from Last 1 Encounters:  09/25/16 5\' 4"  (1.626 m)   Physical exam: Exam: Gen: NAD, conversant, well nourised,  well groomed                     CV: RRR, no MRG. No Carotid Bruits. No peripheral edema, warm, nontender Eyes: Conjunctivae clear without exudates or hemorrhage MSK: severely hypertrophied cervical and paraspinal muscles, siinificant anterocollis, left laterocollis  Neuro: Detailed Neurologic Exam  Speech:    Speech is normal; fluent and spontaneous with normal comprehension.  Cognition:    The patient is oriented to person, place, and time;     recent and remote memory intact;     language fluent;     normal attention, concentration,     fund of knowledge Cranial Nerves:    The pupils are equal, round, and reactive to light. Attempted findoscopic exam could not visualize. Visual fields are full to finger confrontation. Opthalmoparesis of the left eye cannot cross  midling, right with EOMI.  Trigeminal sensation is intact and the muscles of mastication are normal. The face is symmetric. The palate elevates in the midline. Hearing intact. Voice is normal. Shoulder shrug is normal. The tongue has normal motion without fasciculations.   Coordination:    No dysmetria  Gait:    Shuffling, kyphosis and anterocollis of the cervical spine  Motor Observation:    Severely hypertrophied cervical muscles and paraspinal muscles of the cervical spine, thoracic and lumbar spine Tone:    Not increased in the extremities  Posture:    Kyphotic with anterocollis    Strength:    Left  hemiparesis 4/5     Sensation: intact to LT     Reflex Exam:  DTR's:    Deep tendon reflexes in the upper and lower extremities are brisk bilaterally but moreso on the left.   Toes:    The toes are equivocal bilaterally.   Clonus:    Clonus is absent.    Assessment/Plan:  66 year old with significant cervical dystonia,  severely hypertrophied cervical and paraspinal muscles, anterocollis, left laterocollis.   MRI of the cervical spine w/wo contrast  to evaluate for cervical radiculitis given her recent meningitis or other cervical etiologies of neck posuring Physical therapy Labs today Botox for her cervical dystonia 400 units xeomin and will refer to my colleague Dr.  Patient appeared to be in pain given her significant muscle spasms in the neck. But husband called later in the day and said the Tramadol not helping. We cancelled the tramadol refills. Uncomfortable giving oxycodone since she had 30 prescribed a few weeks ago and she has a history of substance abuse.   Orders Placed This Encounter  Procedures  . MR CERVICAL SPINE W WO CONTRAST  . Glutamic acid decarboxylase auto abs  . Ambulatory referral to Physical Therapy    Sarina Ill, MD  [facility] [address] [place], [place] [address]  Phone [phone] Fax  [phone]

## 2016-09-25 NOTE — Progress Notes (Signed)
Tramadol rx printed, signed and faxed to pharmacy.

## 2016-09-26 NOTE — Telephone Encounter (Signed)
We can refill her soma for 30 days or increase her Lyrica, or both. Let them know I cannot prescribe controlled substances(Soma) for more than a month so can give her a Soma once. Also, we will refer to her pain clinic because she is going to need pain management that we cannot provide. thanks

## 2016-09-26 NOTE — Telephone Encounter (Signed)
I called [phone] and this number is not in service. I called the other number which rang for a long time, no one picked up and no voice mail. thanks

## 2016-09-26 NOTE — Telephone Encounter (Signed)
Have tried several times today to contact patient.

## 2016-09-30 MED ORDER — CARISOPRODOL 350 MG PO TABS: 350.0000 mg | ORAL_TABLET | Freq: Three times a day (TID) | ORAL | 0 refills | Status: DC | PRN

## 2016-09-30 MED ORDER — PREGABALIN 100 MG PO CAPS: 100.0000 mg | ORAL_CAPSULE | Freq: Two times a day (BID) | ORAL | 5 refills | Status: DC

## 2016-09-30 NOTE — Addendum Note (Signed)
Addended by: Monte Fantasia on: 09/30/2016 06:16 PM   Modules accepted: Orders

## 2016-09-30 NOTE — Telephone Encounter (Signed)
Patient has a follow up for botox, we can see how she is doing then thanks

## 2016-09-30 NOTE — Telephone Encounter (Signed)
Patient called office in reference to cyclobenzaprine (FLEXERIL) 10 MG tablet taking it 3 times a day.  Patient states she is not having any relief and having a lot of pain.  Please call

## 2016-09-30 NOTE — Telephone Encounter (Signed)
Called and spoke to pt. Says that she has tried taking Flexeril 3x/day through the weekend but she's still having a lot of pain. Advised to increase Lyrica to 100 mg BID and Soma rx printed per discussion w/ Dr. Jaynee Eagles, signed and faxed to pharmacy. She is scheduled for Botox injections w/ Dr, Krista Blue in a couple of wks.

## 2016-09-30 NOTE — Telephone Encounter (Signed)
Thank you :)

## 2016-10-03 ENCOUNTER — Encounter: Payer: Self-pay | Admitting: *Deleted

## 2016-10-05 ENCOUNTER — Ambulatory Visit: Payer: Medicare Other | Admitting: Family Medicine

## 2016-10-10 ENCOUNTER — Other Ambulatory Visit: Payer: Self-pay | Admitting: Neurology

## 2016-10-10 ENCOUNTER — Telehealth: Payer: Self-pay | Admitting: Family Medicine

## 2016-10-10 NOTE — Telephone Encounter (Signed)
Received call from answering service regarding medication refill.   Patient was last seen by me June 9.  Soma 350 mg 3 times a day prescription provided on June 28th.  She was evaluated July 18 by neurologist, diagnosed with cervical dystonia severely hypertrophied cervical paraspinal muscles, anterior Collis, left laterocollis.  She had been prescribed tramadol, but per that note phone call later that day requesting another medication. Apparently had been prescribed 30 oxycodone a few weeks prior and history of substance abuse, provider was uncomfortable giving oxycodone prescription.  Telephone note July 23: Patient was taking Flexeril 3 times a day but still in a lot of pain advised to increase Lyrica to 100 mg twice a day and some prescription was printed after discussion with Dr. Jaynee Eagles.  Planned for Botox injections with Dr. Krista Blue.  #30 Soma were prescribed.  July 28 appointment with me was canceled by patient.   Referral request to Dr. Jaynee Eagles with neurology today for [facility] was refused.   Spoke with husband - tonight having a lot of muscle spasms. Having a tough time tonight, but only taking 2 soma per day past couple of days as she had been doing better. Has 2 doses left.  Having pain in back, pancreas, but that has been ongoing since hospital and has follow up with GI planned. Would like to refill soma. Unable to afford Lyrica, although she has been taking it to this point. They do not feel like it has made much of a difference.  Controlled substance registry reviewed with soma #30 on 09/30/2016. Plan for follow-up with me tomorrow in office to discuss soma refills and plan for her various pains as I have not seen her since June.  We can also discuss plan for her various pains and whether or not pain management may also be needed for ongoing treatment, along with treatment by neurology and other specialists. Overnight ER precautions discussed if any acute worsening abdominal pain, neck pain, or  new associated symptoms.  14 min call.

## 2016-10-14 ENCOUNTER — Telehealth: Payer: Self-pay | Admitting: Neurology

## 2016-10-14 ENCOUNTER — Encounter: Payer: Self-pay | Admitting: Family Medicine

## 2016-10-14 ENCOUNTER — Ambulatory Visit (INDEPENDENT_AMBULATORY_CARE_PROVIDER_SITE_OTHER): Payer: Medicare Other | Admitting: Family Medicine

## 2016-10-14 VITALS — BP 144/75 | HR 85 | Temp 98.5°F | Resp 16 | Ht 64.0 in | Wt 111.0 lb

## 2016-10-14 DIAGNOSIS — R1011 Right upper quadrant pain: Secondary | ICD-10-CM | POA: Diagnosis not present

## 2016-10-14 DIAGNOSIS — R945 Abnormal results of liver function studies: Secondary | ICD-10-CM

## 2016-10-14 DIAGNOSIS — A0471 Enterocolitis due to Clostridium difficile, recurrent: Secondary | ICD-10-CM | POA: Diagnosis not present

## 2016-10-14 DIAGNOSIS — M62838 Other muscle spasm: Secondary | ICD-10-CM

## 2016-10-14 DIAGNOSIS — I639 Cerebral infarction, unspecified: Secondary | ICD-10-CM

## 2016-10-14 DIAGNOSIS — J45909 Unspecified asthma, uncomplicated: Secondary | ICD-10-CM | POA: Diagnosis not present

## 2016-10-14 DIAGNOSIS — M542 Cervicalgia: Secondary | ICD-10-CM | POA: Diagnosis not present

## 2016-10-14 DIAGNOSIS — R7989 Other specified abnormal findings of blood chemistry: Secondary | ICD-10-CM | POA: Diagnosis not present

## 2016-10-14 MED ORDER — FLUTICASONE PROPIONATE HFA 44 MCG/ACT IN AERO: 2.0000 | INHALATION_SPRAY | Freq: Two times a day (BID) | RESPIRATORY_TRACT | 3 refills | Status: DC

## 2016-10-14 MED ORDER — VANCOMYCIN HCL 125 MG PO CAPS: ORAL_CAPSULE | ORAL | 0 refills | Status: DC

## 2016-10-14 MED ORDER — CARISOPRODOL 350 MG PO TABS: 350.0000 mg | ORAL_TABLET | Freq: Three times a day (TID) | ORAL | 0 refills | Status: DC | PRN

## 2016-10-14 NOTE — Patient Instructions (Addendum)
If your orthopaedist needs clearance form neuro for surgery, I would advise Dr. Jaynee Eagles and she can send any specifics to your orthopaedist.  Keep follow up with Dr. Krista Blue as scheduled for treatment of muscle spasms in neck, but I will write for Soma again today.  I would also discuss other options such as gabapentin instead of Lyrica if you were not able to tolerate that medicine due to side effects and cost. I can provide soma for now, but if stronger med needed, please return to discuss options. If long term pain medication is needed, then may need pain specialist.   Continue to avoid tylenol. I will check liver tests again, but follow up with gastroenterologist as planned to discuss your abdominal pain further. Return to the clinic or go to the nearest emergency room if any of your symptoms worsen or new symptoms occur.  Flovent up to 2 puffs twice per day, albuterol if needed for wheezing.   Recheck in next 4-6 weeks, but let me know if med refill needed prior.    IF you received an x-ray today, you will receive an invoice from [facility]. Please contact [facility] at [phone] with questions or concerns regarding your invoice.   IF you received labwork today, you will receive an invoice from [facility]. Please contact [facility] at [phone] with questions or concerns regarding your invoice.   Our billing staff will not be able to assist you with questions regarding bills from these companies.  You will be contacted with the lab results as soon as they are available. The fastest way to get your results is to activate your My Chart account. Instructions are located on the last page of this paperwork. If you have not heard from [place] regarding the results in 2 weeks, please contact this office.

## 2016-10-14 NOTE — Progress Notes (Signed)
By signing my name below, I, [name], attest that this documentation has been prepared under the direction and in the presence of Marissa Ray, MD.  Electronically Signed: Verlee Cardenas, Medical Scribe. 10/14/16. 10:09 AM.  Subjective:    Patient ID: [name], female    DOB: 09-07-1950, 66 y.o.   MRN: [id number]  HPI Chief Complaint  Patient presents with  . Follow-up    hospital visit for colitis and back spasms  . Medication Refill    soma, flovent    HPI Comments: [name] is a 66 y.o. female who presents to Primary Care at [facility] for follow-up for multiple concerns. See telephone note on answering service call on Aug 2nd.  Neck Pain/Spasms: She's been referred to orthopedic and neurology. I last saw her June 9th. She had been treated with soma 350 mg TID, PRN. She's evaluated July 18th by neuro, cervical dystonia with severely hypertorphied cervical paraspinal muscles. Had been prescribed flexeril TID, but persistent pain. She had also been prescribed lyrica and recommended increased dose to 100 mg BID on July 23rd telephone note. They had recommended increasing lyrica but based on our telephone note last week, she has not been able to afford that medication. Reportedly on that phone call, her neck pain had improved so she could take soma BID, but was sore Thursday night. Recommended office follow-up the following day. There was a plan for botox injection with Dr. Krista Cardenas. Controlled substance database reviewed she had soma 350 mg #30 prescribed July 23rd, Dr. Jaynee Cardenas. Lyrica was prescribed July 28th. She had oxycodone prescribed on July 4th for #30, and that was through Dr. Erin Cardenas when hospitalized. Of note she dose have a history of substance abuse, so some caution with controlled medications. 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 She has an appt with Dr. Krista Cardenas in 2 days.   Pt using soma 2-3x a day  with relief of her sxs and flexeril gave little relief of her sxs.Pt found 3 ruptured disk in her neck and degeneration. The last time pt was in the hospital they checked her for meningitis and the test came back negative - no lumbar puncture. Reports "lyrica makes me feel horrible" and it's too expensive. Denies addiction to hydrocodone, or muscle relaxers.  C. Difficile Colitis: Recurrent. We have treated multliple times including tapering dose, however there was some issue with compliant with tapering oral vancomycin. Infectious disease was consulted when hospitalized July 1st - July 4th. Recommended to continue vancomycin 250 mg QID. Planned for vancomycin taper from July 3rd per inf disease recommendations:  Per ID recs >>will start Vancomycin taper as follows :  -125 mg po Vancocin 4x daily x 14 days -125 mg po Vancocin 2x daily x 7 days -125 mg po Vancocin daily x 7 days -125 mg po Vancocin every other day x 7 days -125 mg po Vancocin every 3 days x 14 days   Pt is taking vancomycin every 3rd day since late last week, otherwise she's been keeping up with the hospital regimen.In the past she took it: 4x a day for 2 weeks, 2x a day for 2 week, and 1x a day for a week. Pt was told if she has another infection she's going to get a stool implant. Denies diarrhea in the past 2 weeks, constipation, emesis, blood in stool.  Abdominal Pain: With elevated LFTs thought to e in due part to tylenol. She had  a pancreatic pseudocyst. She had a MRCP in recent hospitalization with the pseudocyst but no bile duct stone. She was evaluated by GI. Had Hep panel, that was negative. Nl lipase on July 3rd, although initially elevate on July 1st. Planned to follow up with Dr. Hilarie Cardenas on Aug 15th.  Still has upper abdominal pain with associated sxs of "hard chills". Pt hasn't taken tylenol since she got out of the hospital. She  plans on showing up to her GI appt. Denies fever, emesis. Lab Results  Component Value Date   ALT 137 (H) 09/11/2016   AST 39 09/11/2016   ALKPHOS 139 (H) 09/11/2016   BILITOT 0.3 09/11/2016   Wheezing/Bronchospasm: Request flovent refill. She is a smoker. Improved sxs when discussed June 9th. Continued on albuterol PRN.  Pt is complaint with flovent (1x a day) and uses albuterol 2x a week.  Patient Active Problem List   Diagnosis Date Noted  . C. difficile colitis 09/10/2016  . Abdominal pain 09/08/2016  . Acute encephalopathy   . Fever   . Left hemiparesis ([place])   . Generalized OA   . Fibromyalgia   . Tobacco abuse   . Substance abuse   . Benign essential HTN   . Tachycardia   . Chronic pain syndrome   . Acute blood loss anemia   . Leukocytosis   . Septic thrombophlebitis of sagittal sinus   . Acute respiratory failure ([place])   . Streptococcus pneumoniae meningitis   . Streptococcal bacteremia   . Meningitis   . Cerebral embolism with cerebral infarction 04/12/2016  . Stroke (cerebrum) ([place]) 04/12/2016  . Neck pain 04/04/2016  . Chronic abdominal pain 02/19/2016  . Essential hypertension 02/19/2016  . Bacterial conjunctivitis 02/19/2016  . Health care maintenance 02/19/2016  . ANXIETY 05/08/2006  . TOBACCO DEPENDENCE 05/08/2006  . ASTHMA, UNSPECIFIED 05/08/2006  . GASTROESOPHAGEAL REFLUX, NO ESOPHAGITIS 05/08/2006  . CONSTIPATION 05/08/2006  . CONVULSIONS, SEIZURES, NOS 05/08/2006   Past Medical History:  Diagnosis Date  . Acute blood loss anemia   . Arrhythmia   . Arthritis   . C. difficile colitis   . Cancer ([place])   . Fibromyalgia   . GERD (gastroesophageal reflux disease)   . GI hemorrhage   . H/O: substance abuse 2005   narcotic usage due to chronic back pain  . Heart murmur   . Hypertension   . Pneumococcal meningitis   . PONV (postoperative nausea and vomiting)   . Stroke (cerebrum) [facility])    Past Surgical History:  Procedure Laterality Date  .  ABDOMINAL HYSTERECTOMY    . ABDOMINAL SURGERY    . APPENDECTOMY    . BREAST SURGERY    . CHOLECYSTECTOMY N/A 09/30/2012   Procedure: LAPAROSCOPIC CHOLECYSTECTOMY WITH INTRAOPERATIVE CHOLANGIOGRAM;  Surgeon: Imogene Burn. Georgette Dover, MD;  Location: [facility] ORS;  Service: General;  Laterality: N/A;  . FRACTURE SURGERY     right upper arm  . OVARIAN CYST SURGERY     Allergies  Allergen Reactions  . Codeine Nausea And Vomiting  . Nsaids Other (See Comments)    Stomach ulcers   Prior to Admission medications   Medication Sig Start Date End Date Taking? Authorizing Provider  albuterol (PROVENTIL HFA;VENTOLIN HFA) 108 (90 Base) MCG/ACT inhaler Inhale 1-2 puffs into the lungs every 4 (four) hours as needed for wheezing or shortness of breath. 09/06/16  Yes [name], MD  carisoprodol (SOMA) 350 MG tablet Take 1 tablet (350 mg total) by mouth 3 (three) times daily as  needed for muscle spasms. 09/30/16  Yes Melvenia Beam, MD  carvedilol (COREG) 6.25 MG tablet Take 1 tablet (6.25 mg total) by mouth 2 (two) times daily with a meal. 08/08/16  Yes [name], MD  fluticasone (FLOVENT HFA) 44 MCG/ACT inhaler Inhale 2 puffs into the lungs 2 (two) times daily. 08/09/16  Yes [name], MD  pantoprazole (PROTONIX) 40 MG tablet Take 40 mg by mouth daily.   Yes [provider]  cyclobenzaprine (FLEXERIL) 10 MG tablet Take 1 tablet (10 mg total) by mouth 3 (three) times daily as needed for muscle spasms. Patient not taking: Reported on 10/14/2016 09/25/16   Melvenia Beam, MD  pregabalin (LYRICA) 100 MG capsule Take 1 capsule (100 mg total) by mouth 2 (two) times daily. Patient not taking: Reported on 10/14/2016 09/30/16 09/30/17  Melvenia Beam, MD  vancomycin (VANCOCIN HCL) 125 MG capsule Take 1 capsule (125 mg total) by mouth 4 (four) times daily. Patient not taking: Reported on 10/14/2016 09/11/16   Lind Covert, MD  vancomycin (VANCOCIN HCL) 250 MG capsule Take 1 capsule (250 mg total)  by mouth 4 (four) times daily. Patient not taking: Reported on 10/14/2016 09/11/16   Lind Covert, MD   Social History   Social History  . Marital status: Married    Spouse name: N/A  . Number of children: N/A  . Years of education: N/A   Occupational History  . Not on file.   Social History Main Topics  . Smoking status: Current Some Day Smoker    Packs/day: 0.25    Years: 30.00    Types: Cigarettes  . Smokeless tobacco: Never Used  . Alcohol use No  . Drug use: No     Comment: Methadone for narcotic use  . Sexual activity: Not on file   Other Topics Concern  . Not on file   Social History Narrative  . No narrative on file   Review of Systems  Gastrointestinal: Positive for abdominal pain. Negative for blood in stool, constipation, diarrhea and vomiting.  Musculoskeletal: Positive for neck pain and neck stiffness.   Objective:  Physical Exam  Constitutional: She appears well-developed and well-nourished. No distress.  HENT:  Head: Normocephalic and atraumatic.  Eyes: Conjunctivae are normal.  Neck: Neck supple.  Significant spasms of paraspinal muscles She's held in flexion Minimal extension Minimal rotation No midline bony tenderness Has pain over paraspinal muscles with spasm   Cardiovascular: Normal rate, regular rhythm and normal heart sounds.  Exam reveals no gallop and no friction rub.   No murmur heard. Pulmonary/Chest: Effort normal and breath sounds normal. No respiratory distress. She has no wheezes. She has no rales.  Abdominal: She exhibits no distension. There is tenderness in the right upper quadrant and right lower quadrant.  Minimal RLQ tenderness  Neurological: She is alert.  Skin: Skin is warm and dry.  Psychiatric: She has a normal mood and affect. Her behavior is normal.  Nursing note and vitals reviewed.   Vitals:   10/14/16 0924  BP: (!) 144/75  Pulse: 85  Resp: 16  Temp: 98.5 F (36.9 C)  TempSrc: Oral  SpO2: 98%    Weight: 111 lb (50.3 kg)  Height: 5\' 4"  (1.626 m)  Body mass index is 19.05 kg/m. Assessment & Plan:   Kahlan Engebretson is a 66 y.o. female Neck pain - Plan: carisoprodol (SOMA) 350 MG tablet Muscle spasms of neck - Plan: carisoprodol (SOMA) 350 MG tablet  -  Plan for follow-up with neurology for possible Botox injection as well as possible surgical intervention for degenerative disc disease. Recommended she discuss with neurology any necessary meningitis clearance for her orthopedist for surgical intervention.   -she has had some relief with [name], denies addiction, and controlled substance registry reviewed without apparent misuse. Agreed to refill that medication for now.  -Recommended discussing other options besides Lyrica due to side effects/cost. Gabapentin may be option.  Recurrent colitis due to Clostridium difficile - Plan: vancomycin (VANCOCIN HCL) 125 MG capsule  - Diarrhea has resolved. Improving. Will continue taper as per instructions below. RTC precautions if returns, as may need fecal transplant.  Right upper quadrant abdominal pain - Plan: CBC Elevated liver function tests - Plan: Comprehensive metabolic panel  - Prior elevated LFTs thought to be due to acetaminophen overuse. She has avoided Tylenol, does still have some right upper quadrant pain. Has follow-up with GI pending, but will check CMP, CBC. ER/RTC precautions.  Uncomplicated asthma, unspecified asthma severity, unspecified whether persistent - Plan: fluticasone (FLOVENT HFA) 44 MCG/ACT inhaler  -Overall stable. Has been using Flovent 1 puff twice per day due to cost, but overall appears to be controlled at that dosing. If increased albuterol need, would recommend increase to 2 puffs twice per day. Discount pharmacy card options were provided.  Meds ordered this encounter  Medications  . carisoprodol (SOMA) 350 MG tablet    Sig: Take 1 tablet (350 mg total) by mouth 3 (three) times daily as needed for muscle  spasms.    Dispense:  30 tablet    Refill:  0  . vancomycin (VANCOCIN HCL) 125 MG capsule    Sig: 1 po QOD for 7 days, then 1 po  every 3 days x 14 days    Dispense:  9 capsule    Refill:  0  . fluticasone (FLOVENT HFA) 44 MCG/ACT inhaler    Sig: Inhale 2 puffs into the lungs 2 (two) times daily.    Dispense:  1 Inhaler    Refill:  3   Patient Instructions   If your orthopaedist needs clearance form neuro for surgery, I would advise Dr. Jaynee Cardenas and she can send any specifics to your orthopaedist.  Keep follow up with Dr. Krista Cardenas as scheduled for treatment of muscle spasms in neck, but I will write for Soma again today.  I would also discuss other options such as gabapentin instead of Lyrica if you were not able to tolerate that medicine due to side effects and cost. I can provide soma for now, but if stronger med needed, please return to discuss options. If long term pain medication is needed, then may need pain specialist.   Continue to avoid tylenol. I will check liver tests again, but follow up with gastroenterologist as planned to discuss your abdominal pain further. Return to the clinic or go to the nearest emergency room if any of your symptoms worsen or new symptoms occur.  Flovent up to 2 puffs twice per day, albuterol if needed for wheezing.   Recheck in next 4-6 weeks, but let me know if med refill needed prior.    IF you received an x-Cardenas today, you will receive an invoice from [facility]. Please contact [facility] at [phone] with questions or concerns regarding your invoice.   IF you received labwork today, you will receive an invoice from [place]. Please contact [facility] at [phone] with questions or concerns regarding your invoice.   Our billing staff will not be  able to assist you with questions regarding bills from these companies.  You will be contacted with the lab results as soon as they are available. The fastest way to get your results is to  activate your My Chart account. Instructions are located on the last page of this paperwork. If you have not heard from [place] regarding the results in 2 weeks, please contact this office.       I personally performed the services described in this documentation, which was scribed in my presence. The recorded information has been reviewed and considered for accuracy and completeness, addended by me as needed, and agree with information above.  Signed,   Marissa Ray, MD Primary [facility] at [place].  10/16/16 12:12 PM

## 2016-10-14 NOTE — Telephone Encounter (Signed)
Pt called the office she is not feeling well, r/s xeomin appt to 9/19  [facility]

## 2016-10-14 NOTE — Telephone Encounter (Signed)
Noted, thank you

## 2016-10-15 LAB — CBC
Hematocrit: 39.4 % (ref 34.0–46.6)
Hemoglobin: 13.1 g/dL (ref 11.1–15.9)
MCH: 30.8 pg (ref 26.6–33.0)
MCHC: 33.2 g/dL (ref 31.5–35.7)
MCV: 93 fL (ref 79–97)
Platelets: 357 10*3/uL (ref 150–379)
RBC: 4.25 x10E6/uL (ref 3.77–5.28)
RDW: 14.4 % (ref 12.3–15.4)
WBC: 8.2 10*3/uL (ref 3.4–10.8)

## 2016-10-15 LAB — COMPREHENSIVE METABOLIC PANEL
ALT: 16 IU/L (ref 0–32)
AST: 14 IU/L (ref 0–40)
Albumin/Globulin Ratio: 2 (ref 1.2–2.2)
Albumin: 4.4 g/dL (ref 3.6–4.8)
Alkaline Phosphatase: 122 IU/L — ABNORMAL HIGH (ref 39–117)
BUN/Creatinine Ratio: 17 (ref 12–28)
BUN: 13 mg/dL (ref 8–27)
Bilirubin Total: 0.2 mg/dL (ref 0.0–1.2)
CO2: 24 mmol/L (ref 20–29)
Calcium: 9.9 mg/dL (ref 8.7–10.3)
Chloride: 105 mmol/L (ref 96–106)
Creatinine, Ser: 0.75 mg/dL (ref 0.57–1.00)
GFR calc Af Amer: 97 mL/min/{1.73_m2} (ref >59)
GFR calc non Af Amer: 85 mL/min/{1.73_m2} (ref >59)
Globulin, Total: 2.2 g/dL (ref 1.5–4.5)
Glucose: 117 mg/dL — ABNORMAL HIGH (ref 65–99)
Potassium: 4.3 mmol/L (ref 3.5–5.2)
Sodium: 144 mmol/L (ref 134–144)
Total Protein: 6.6 g/dL (ref 6.0–8.5)

## 2016-10-16 ENCOUNTER — Ambulatory Visit: Payer: Medicare Other | Admitting: Neurology

## 2016-10-20 ENCOUNTER — Emergency Department (HOSPITAL_COMMUNITY): Payer: Medicare Other

## 2016-10-20 ENCOUNTER — Encounter (HOSPITAL_COMMUNITY): Payer: Self-pay | Admitting: Emergency Medicine

## 2016-10-20 ENCOUNTER — Emergency Department (HOSPITAL_COMMUNITY)
Admission: EM | Admit: 2016-10-20 | Discharge: 2016-10-20 | Disposition: A | Discharge status: Home / Self Care | Payer: Medicare Other | Attending: Emergency Medicine | Admitting: Emergency Medicine

## 2016-10-20 DIAGNOSIS — F17210 Nicotine dependence, cigarettes, uncomplicated: Secondary | ICD-10-CM | POA: Insufficient documentation

## 2016-10-20 DIAGNOSIS — R0602 Shortness of breath: Secondary | ICD-10-CM | POA: Diagnosis not present

## 2016-10-20 DIAGNOSIS — E876 Hypokalemia: Secondary | ICD-10-CM | POA: Diagnosis not present

## 2016-10-20 DIAGNOSIS — J45909 Unspecified asthma, uncomplicated: Secondary | ICD-10-CM | POA: Insufficient documentation

## 2016-10-20 DIAGNOSIS — R1011 Right upper quadrant pain: Secondary | ICD-10-CM | POA: Insufficient documentation

## 2016-10-20 DIAGNOSIS — Z79899 Other long term (current) drug therapy: Secondary | ICD-10-CM | POA: Diagnosis not present

## 2016-10-20 DIAGNOSIS — Z859 Personal history of malignant neoplasm, unspecified: Secondary | ICD-10-CM | POA: Insufficient documentation

## 2016-10-20 DIAGNOSIS — G8929 Other chronic pain: Secondary | ICD-10-CM | POA: Insufficient documentation

## 2016-10-20 DIAGNOSIS — I10 Essential (primary) hypertension: Secondary | ICD-10-CM | POA: Insufficient documentation

## 2016-10-20 DIAGNOSIS — Z8673 Personal history of transient ischemic attack (TIA), and cerebral infarction without residual deficits: Secondary | ICD-10-CM | POA: Diagnosis not present

## 2016-10-20 DIAGNOSIS — K529 Noninfective gastroenteritis and colitis, unspecified: Secondary | ICD-10-CM | POA: Diagnosis not present

## 2016-10-20 DIAGNOSIS — R0600 Dyspnea, unspecified: Secondary | ICD-10-CM | POA: Insufficient documentation

## 2016-10-20 DIAGNOSIS — R109 Unspecified abdominal pain: Secondary | ICD-10-CM

## 2016-10-20 DIAGNOSIS — R1010 Upper abdominal pain, unspecified: Secondary | ICD-10-CM | POA: Diagnosis not present

## 2016-10-20 LAB — COMPREHENSIVE METABOLIC PANEL
ALT: 16 U/L (ref 14–54)
AST: 17 U/L (ref 15–41)
Albumin: 4.1 g/dL (ref 3.5–5.0)
Alkaline Phosphatase: 109 U/L (ref 38–126)
Anion gap: 8 (ref 5–15)
BUN: 11 mg/dL (ref 6–20)
CO2: 23 mmol/L (ref 22–32)
Calcium: 9.3 mg/dL (ref 8.9–10.3)
Chloride: 108 mmol/L (ref 101–111)
Creatinine, Ser: 0.74 mg/dL (ref 0.44–1.00)
GFR calc Af Amer: >60 mL/min (ref >60)
GFR calc non Af Amer: >60 mL/min (ref >60)
Glucose, Bld: 100 mg/dL — ABNORMAL HIGH (ref 65–99)
Potassium: 3.3 mmol/L — ABNORMAL LOW (ref 3.5–5.1)
Sodium: 139 mmol/L (ref 135–145)
Total Bilirubin: 0.7 mg/dL (ref 0.3–1.2)
Total Protein: 6.6 g/dL (ref 6.5–8.1)

## 2016-10-20 LAB — CBC WITH DIFFERENTIAL/PLATELET
Basophils Absolute: 0 10*3/uL (ref 0.0–0.1)
Basophils Relative: 1 %
Eosinophils Absolute: 0.1 10*3/uL (ref 0.0–0.7)
Eosinophils Relative: 1 %
HCT: 40.9 % (ref 36.0–46.0)
Hemoglobin: 14 g/dL (ref 12.0–15.0)
Lymphocytes Relative: 37 %
Lymphs Abs: 2.9 10*3/uL (ref 0.7–4.0)
MCH: 31.5 pg (ref 26.0–34.0)
MCHC: 34.2 g/dL (ref 30.0–36.0)
MCV: 92.1 fL (ref 78.0–100.0)
Monocytes Absolute: 0.6 10*3/uL (ref 0.1–1.0)
Monocytes Relative: 7 %
Neutro Abs: 4.3 10*3/uL (ref 1.7–7.7)
Neutrophils Relative %: 54 %
Platelets: 351 10*3/uL (ref 150–400)
RBC: 4.44 MIL/uL (ref 3.87–5.11)
RDW: 13.4 % (ref 11.5–15.5)
WBC: 7.9 10*3/uL (ref 4.0–10.5)

## 2016-10-20 LAB — I-STAT TROPONIN, ED: Troponin i, poc: 0 ng/mL (ref 0.00–0.08)

## 2016-10-20 LAB — LIPASE, BLOOD: Lipase: 37 U/L (ref 11–51)

## 2016-10-20 MED ORDER — ONDANSETRON 8 MG PO TBDP: ORAL_TABLET | ORAL | 0 refills | Status: DC

## 2016-10-20 MED ORDER — SODIUM CHLORIDE 0.9 % IV BOLUS (SEPSIS)
1000.0000 mL | Freq: Once | INTRAVENOUS | Status: AC
  Administered 2016-10-20: 1000 mL via INTRAVENOUS

## 2016-10-20 MED ORDER — IOPAMIDOL (ISOVUE-300) INJECTION 61%
INTRAVENOUS | Status: AC
  Administered 2016-10-20: 30 mL
  Filled 2016-10-20: qty 30

## 2016-10-20 MED ORDER — ONDANSETRON HCL 4 MG/2ML IJ SOLN
4.0000 mg | Freq: Once | INTRAMUSCULAR | Status: AC
  Administered 2016-10-20: 4 mg via INTRAVENOUS
  Filled 2016-10-20: qty 2

## 2016-10-20 MED ORDER — POTASSIUM CHLORIDE CRYS ER 20 MEQ PO TBCR: 40.0000 meq | EXTENDED_RELEASE_TABLET | Freq: Once | ORAL | Status: DC

## 2016-10-20 MED ORDER — IOPAMIDOL (ISOVUE-300) INJECTION 61%
INTRAVENOUS | Status: AC
  Administered 2016-10-20: 100 mL
  Filled 2016-10-20: qty 100

## 2016-10-20 NOTE — ED Triage Notes (Signed)
Pt. Stated, I've had SOB for the last week. Ive been DX with colitis from taking antibiotics from having meningitis. But my doctor said to come here if it continues.

## 2016-10-20 NOTE — Discharge Instructions (Signed)
Take the medication as needed for nausea. Call Dr. Carlota Raspberry tomorrow to schedule follow-up appointment. Asked Dr. Carlota Raspberry to help you to stop smoking

## 2016-10-20 NOTE — ED Provider Notes (Signed)
[facility] DEPT Provider Note   CSN: [id number] Arrival date & time: 10/20/16  3235     History   Chief Complaint Chief Complaint  Patient presents with  . Shortness of Breath  . Abdominal Pain  . Chills    HPI [name] is a 66 y.o. female.  HPI Complains of abdominal pain at right upper quadrant and right flank for the past 2 weeks.Patient reports that she's had similar pain at right flank and right upper quadrant since having had meningitis some years ago Not made better or worse by anything. Associated symptoms include chills, diminished appetite and nausea. No vomiting. She also complains of shortness of breath and productive cough of yellowish sputum for approximately 2 weeks on the stating she has trouble walking from one room to the next due to shortness of breath. She is currently treated with vancomycin for C. difficile colitis. She reports 3 episodes of diarrhea yesterday. Diarrhea has slowed since she's been on vancomycin. Dyspnea is worse with walking and improved with rest. Nothing makes abdominal pain better or worse. Past Medical History:  Diagnosis Date  . Acute blood loss anemia   . Arrhythmia   . Arthritis   . C. difficile colitis   . Cancer ([place])   . Fibromyalgia   . GERD (gastroesophageal reflux disease)   . GI hemorrhage   . H/O: substance abuse 2005   narcotic usage due to chronic back pain  . Heart murmur   . Hypertension   . Pneumococcal meningitis   . PONV (postoperative nausea and vomiting)   . Stroke (cerebrum) [facility])     Patient Active Problem List   Diagnosis Date Noted  . C. difficile colitis 09/10/2016  . Abdominal pain 09/08/2016  . Acute encephalopathy   . Fever   . Left hemiparesis ([place])   . Generalized OA   . Fibromyalgia   . Tobacco abuse   . Substance abuse   . Benign essential HTN   . Tachycardia   . Chronic pain syndrome   . Acute blood loss anemia   . Leukocytosis   . Septic thrombophlebitis of sagittal sinus     . Acute respiratory failure ([place])   . Streptococcus pneumoniae meningitis   . Streptococcal bacteremia   . Meningitis   . Cerebral embolism with cerebral infarction 04/12/2016  . Stroke (cerebrum) ([place]) 04/12/2016  . Neck pain 04/04/2016  . Chronic abdominal pain 02/19/2016  . Essential hypertension 02/19/2016  . Bacterial conjunctivitis 02/19/2016  . Health care maintenance 02/19/2016  . ANXIETY 05/08/2006  . TOBACCO DEPENDENCE 05/08/2006  . ASTHMA, UNSPECIFIED 05/08/2006  . GASTROESOPHAGEAL REFLUX, NO ESOPHAGITIS 05/08/2006  . CONSTIPATION 05/08/2006  . CONVULSIONS, SEIZURES, NOS 05/08/2006    Past Surgical History:  Procedure Laterality Date  . ABDOMINAL HYSTERECTOMY    . ABDOMINAL SURGERY    . APPENDECTOMY    . BREAST SURGERY    . CHOLECYSTECTOMY N/A 09/30/2012   Procedure: LAPAROSCOPIC CHOLECYSTECTOMY WITH INTRAOPERATIVE CHOLANGIOGRAM;  Surgeon: Imogene Burn. Georgette Dover, MD;  Location: [facility] ORS;  Service: General;  Laterality: N/A;  . FRACTURE SURGERY     right upper arm  . OVARIAN CYST SURGERY      OB History    No data available       Home Medications    Prior to Admission medications   Medication Sig Start Date End Date Taking? Authorizing Provider  albuterol (PROVENTIL HFA;VENTOLIN HFA) 108 (90 Base) MCG/ACT inhaler Inhale 1-2 puffs into the lungs every 4 (four) hours  as needed for wheezing or shortness of breath. 09/06/16   [name], MD  carisoprodol (SOMA) 350 MG tablet Take 1 tablet (350 mg total) by mouth 3 (three) times daily as needed for muscle spasms. 10/14/16   [name], MD  carvedilol (COREG) 6.25 MG tablet Take 1 tablet (6.25 mg total) by mouth 2 (two) times daily with a meal. 08/08/16   [name], MD  fluticasone (FLOVENT HFA) 44 MCG/ACT inhaler Inhale 2 puffs into the lungs 2 (two) times daily. 10/14/16   [name], MD  pantoprazole (PROTONIX) 40 MG tablet Take 40 mg by mouth daily.    [provider]  vancomycin  (VANCOCIN HCL) 125 MG capsule 1 po QOD for 7 days, then 1 po  every 3 days x 14 days 10/14/16   [name], MD    Family History Family History  Problem Relation Age of Onset  . Alzheimer's disease Mother   . Heart disease Father   . Prostate cancer Father     Social History Social History  Substance Use Topics  . Smoking status: Current Some Day Smoker    Packs/day: 0.25    Years: 30.00    Types: Cigarettes  . Smokeless tobacco: Never Used  . Alcohol use No     Allergies   Codeine and Nsaids   Review of Systems Review of Systems  Constitutional: Positive for appetite change.  HENT: Negative.   Eyes:       Left eye "paralyzed."  Respiratory: Positive for cough and shortness of breath.   Cardiovascular: Negative.   Gastrointestinal: Positive for abdominal pain, diarrhea and nausea. Negative for vomiting.  Musculoskeletal: Negative.   Skin: Negative.   Neurological: Positive for weakness.       Chronic left hemipaesis  Psychiatric/Behavioral: Negative.   All other systems reviewed and are negative.    Physical Exam Updated Vital Signs BP (!) 150/89 (BP Location: Left Arm)   Pulse (!) 112   Temp 97.9 F (36.6 C) (Oral)   Resp (!) 24   Ht 5\' 4"  (1.626 m)   Wt 50.3 kg (111 lb)   SpO2 98%   BMI 19.05 kg/m   Physical Exam  Constitutional: She is oriented to person, place, and time.  Chronically ill-appearing  HENT:  Head: Normocephalic and atraumatic.  Eyes: EOM are normal.  Left eye held in neutral position right eye with extraocular muscles intact  Neck: Neck supple. No tracheal deviation present. No thyromegaly present.  Cardiovascular: Regular rhythm.   No murmur heard. Mildly tachycardic  Pulmonary/Chest: Effort normal and breath sounds normal.  Abdominal: Soft. Bowel sounds are normal. She exhibits no distension and no mass. There is tenderness. There is no rebound and no guarding.  Tender at right upper quadrant  Musculoskeletal: Normal  range of motion. She exhibits no edema or tenderness.  Neurological: She is alert and oriented to person, place, and time. Coordination normal.  Skin: Skin is warm and dry. No rash noted.  Psychiatric: She has a normal mood and affect.  Nursing note and vitals reviewed.    ED Treatments / Results  Labs (all labs ordered are listed, but only abnormal results are displayed) Labs Reviewed  CBC WITH DIFFERENTIAL/PLATELET  COMPREHENSIVE METABOLIC PANEL  LIPASE, BLOOD  I-STAT TROPONIN, ED  Chest x-ray viewed by me  EKG  EKG Interpretation  Date/Time:  Sunday October 20 2016 09:31:48 EDT Ventricular Rate:  111 PR Interval:  130 QRS Duration: 90 QT  Interval:  354 QTC Calculation: 481 R Axis:   74 Text Interpretation:  Sinus tachycardia Right atrial enlargement Borderline ECG Since last tracing rate slower Confirmed by Orlie Dakin [phone]) on 10/20/2016 10:24:07 AM      Results for orders placed or performed during the hospital encounter of 10/20/16  CBC with Differential  Result Value Ref Range   WBC 7.9 4.0 - 10.5 K/uL   RBC 4.44 3.87 - 5.11 MIL/uL   Hemoglobin 14.0 12.0 - 15.0 g/dL   HCT 40.9 36.0 - 46.0 %   MCV 92.1 78.0 - 100.0 fL   MCH 31.5 26.0 - 34.0 pg   MCHC 34.2 30.0 - 36.0 g/dL   RDW 13.4 11.5 - 15.5 %   Platelets 351 150 - 400 K/uL   Neutrophils Relative % 54 %   Neutro Abs 4.3 1.7 - 7.7 K/uL   Lymphocytes Relative 37 %   Lymphs Abs 2.9 0.7 - 4.0 K/uL   Monocytes Relative 7 %   Monocytes Absolute 0.6 0.1 - 1.0 K/uL   Eosinophils Relative 1 %   Eosinophils Absolute 0.1 0.0 - 0.7 K/uL   Basophils Relative 1 %   Basophils Absolute 0.0 0.0 - 0.1 K/uL  Comprehensive metabolic panel  Result Value Ref Range   Sodium 139 135 - 145 mmol/L   Potassium 3.3 (L) 3.5 - 5.1 mmol/L   Chloride 108 101 - 111 mmol/L   CO2 23 22 - 32 mmol/L   Glucose, Bld 100 (H) 65 - 99 mg/dL   BUN 11 6 - 20 mg/dL   Creatinine, Ser 0.74 0.44 - 1.00 mg/dL   Calcium 9.3 8.9 - 10.3 mg/dL    Total Protein 6.6 6.5 - 8.1 g/dL   Albumin 4.1 3.5 - 5.0 g/dL   AST 17 15 - 41 U/L   ALT 16 14 - 54 U/L   Alkaline Phosphatase 109 38 - 126 U/L   Total Bilirubin 0.7 0.3 - 1.2 mg/dL   GFR calc non Af Amer >60 >60 mL/min   GFR calc Af Amer >60 >60 mL/min   Anion gap 8 5 - 15  Lipase, blood  Result Value Ref Range   Lipase 37 11 - 51 U/L  I-stat troponin, ED  Result Value Ref Range   Troponin i, poc 0.00 0.00 - 0.08 ng/mL   Comment 3           Dg Chest 2 View  Result Date: 10/20/2016 CLINICAL DATA:  Shortness of breath for 1 week EXAM: CHEST  2 VIEW COMPARISON:  09/08/2016 FINDINGS: Cardiac shadow is within normal limits. Aortic calcifications are again seen and stable. The lungs are well aerated bilaterally. No focal infiltrate or sizable effusion is seen. No acute bony abnormality is noted. IMPRESSION: No active cardiopulmonary disease. Aortic Atherosclerosis (ICD10-170.0) Electronically Signed   By: Inez Catalina M.D.   On: 10/20/2016 10:53   Ct Abdomen Pelvis W Contrast  Result Date: 10/20/2016 CLINICAL DATA:  Upper abdominal pain, colitis for 3 months worsened last couple weeks, history hypertension, Celsius difficile colitis, stroke, fibromyalgia, asthma, smoker EXAM: CT ABDOMEN AND PELVIS WITH CONTRAST TECHNIQUE: Multidetector CT imaging of the abdomen and pelvis was performed using the standard protocol following bolus administration of intravenous contrast. Sagittal and coronal MPR images reconstructed from axial data set. CONTRAST:  125mL ISOVUE-300 IOPAMIDOL (ISOVUE-300) INJECTION 61% IV. Dilute oral contrast. COMPARISON:  09/08/2016 FINDINGS: Lower chest: Lung bases emphysematous but clear Hepatobiliary: Gallbladder surgically absent. Liver unremarkable. Resolution of biliary dilatation  seen on previous exam. Pancreas: 8 x 6 x 7 mm cystic lesion at pancreatic head unchanged. 5 mm cystic lesion at pancreatic tail unchanged. Questionable tiny 4 mm cystic lesion at pancreatic tail.  Spleen: Normal appearance Adrenals/Urinary Tract: Adrenal glands normal appearance. Numerous BILATERAL renal cysts. No enhancing renal mass, hydronephrosis or hydroureter. Bladder unremarkable. Stomach/Bowel: Stomach and small bowel loops normal appearance. Majority of colon is opacified and under distended particularly at the sigmoid colon and rectum. No definite evidence of colitis. Vascular/Lymphatic: Atherosclerotic calcifications aorta without aneurysm. No adenopathy. Reproductive: Uterus surgically absent. Nonvisualization of ovaries. Other: No free air or free fluid.  No hernia. Musculoskeletal: Diffuse osseous demineralization. IMPRESSION: Resolution of biliary dilatation seen on previous exam. Again identified renal and pancreatic cysts, grossly unchanged. No definite new intra-abdominal or intrapelvic abnormalities. Aortic Atherosclerosis (ICD10-I70.0) and Emphysema (ICD10-J43.9). Electronically Signed   By: Lavonia Dana M.D.   On: 10/20/2016 12:56   Radiology No results found.  Procedures Procedures (including critical care time)  Medications Ordered in ED Medications  sodium chloride 0.9 % bolus 1,000 mL (not administered)  ondansetron (ZOFRAN) injection 4 mg (not administered)     Initial Impression / Assessment and Plan / ED Course  I have reviewed the triage vital signs and the nursing notes.  Pertinent labs & imaging results that were available during my care of the patient were reviewed by me and considered in my medical decision making (see chart for details).     1:30 PM pain is improved without treatment. 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 Nausea is resolved after treatment with intravenous Zofran. I counseled patient for 5 minutes on smoking cessation Plan prescription Zofran. Follow up with Dr. Sherilyn Cooter potassium chloride 40 mEq prior to discharge Final Clinical Impressions(s) / ED Diagnoses  Diagnosis #1 chronic abdominal pain #2  tobacco abuse #3 dyspnea #4 hypokalemia Final diagnoses:  None    New Prescriptions New Prescriptions   No medications on file     Orlie Dakin, MD 10/20/16 1350

## 2016-10-21 ENCOUNTER — Ambulatory Visit: Payer: Medicare Other | Admitting: Internal Medicine

## 2016-10-23 ENCOUNTER — Other Ambulatory Visit: Payer: Self-pay | Admitting: Family Medicine

## 2016-10-23 DIAGNOSIS — M62838 Other muscle spasm: Secondary | ICD-10-CM

## 2016-10-23 DIAGNOSIS — M542 Cervicalgia: Secondary | ICD-10-CM

## 2016-10-25 NOTE — Telephone Encounter (Signed)
Adv pt husband we did receive the refill request on the pt's carisoprodol and the time frame was 48-72 hours.

## 2016-11-07 ENCOUNTER — Telehealth: Payer: Self-pay | Admitting: Family Medicine

## 2016-11-07 DIAGNOSIS — M542 Cervicalgia: Secondary | ICD-10-CM

## 2016-11-07 DIAGNOSIS — M62838 Other muscle spasm: Secondary | ICD-10-CM

## 2016-11-07 NOTE — Telephone Encounter (Signed)
Pt called requesting referral for [facility]. She said she has spinal meningitis and would like to get this looked at by this office. Please advise. Pt can be contacted at [phone]. Pt also mentioned needing Dr. Carlota Raspberry to call in [place]. Thanks!

## 2016-11-08 MED ORDER — CARISOPRODOL 350 MG PO TABS: 350.0000 mg | ORAL_TABLET | Freq: Three times a day (TID) | ORAL | 0 refills | Status: DC | PRN

## 2016-11-08 NOTE — Telephone Encounter (Signed)
Dr. Carlota Raspberry, Patient has seen you for her neck, however, she has also seen neurologist, Dr. Myna Bright Ahem for cervical radiculitis.  I am uncertain if you feel she needs to follow up with you or the neuro.  In previous phone message it states she would like to be seen here.  Please advise. Thanks, [employer]

## 2016-11-08 NOTE — Telephone Encounter (Signed)
If she feels she is having symptoms of meningitis, would need evaluation either in office, emergency room, or with her neurologist.  There was a question last visit regarding clearance from meningitis, and asked that she discuss that with her neurologist, but it appears she was unable to keep that appointment. Follow up here or in ER if acute changes in her neck symptoms, headaches, or fevers.  I will refill Soma temporarily, but had also asked that she discuss other neck pain treatment options with neurology. Again she may need to see them sooner than next visit in a few weeks.

## 2016-11-08 NOTE — Addendum Note (Signed)
Addended by: [name] R on: 11/08/2016 03:06 PM   Modules accepted: Orders

## 2016-11-09 NOTE — Telephone Encounter (Signed)
Left refill auth on pharmacy voicemail

## 2016-11-12 ENCOUNTER — Telehealth: Payer: Self-pay | Admitting: Internal Medicine

## 2016-11-12 NOTE — Telephone Encounter (Signed)
Pt reports she is still having a lot of abdominal pain, does not thinks she can wait until October. Pt scheduled to see Amy Esterwood PA 11/13/16@2 :30pm. Pt aware of appt.

## 2016-11-12 NOTE — Telephone Encounter (Signed)
Left message for pt to call back  °

## 2016-11-13 ENCOUNTER — Encounter: Payer: Self-pay | Admitting: Physician Assistant

## 2016-11-13 ENCOUNTER — Ambulatory Visit (INDEPENDENT_AMBULATORY_CARE_PROVIDER_SITE_OTHER): Payer: Medicare Other | Admitting: Physician Assistant

## 2016-11-13 VITALS — BP 172/110 | HR 96 | Ht 63.5 in | Wt 115.5 lb

## 2016-11-13 DIAGNOSIS — I639 Cerebral infarction, unspecified: Secondary | ICD-10-CM | POA: Diagnosis not present

## 2016-11-13 DIAGNOSIS — Z8719 Personal history of other diseases of the digestive system: Secondary | ICD-10-CM

## 2016-11-13 DIAGNOSIS — R1011 Right upper quadrant pain: Secondary | ICD-10-CM | POA: Diagnosis not present

## 2016-11-13 DIAGNOSIS — R630 Anorexia: Secondary | ICD-10-CM

## 2016-11-13 MED ORDER — NA SULFATE-K SULFATE-MG SULF 17.5-3.13-1.6 GM/177ML PO SOLN: 1.0000 | Freq: Once | ORAL | 0 refills | Status: AC

## 2016-11-13 MED ORDER — TRAMADOL HCL 50 MG PO TABS: ORAL_TABLET | ORAL | 0 refills | Status: DC

## 2016-11-13 NOTE — Patient Instructions (Signed)
We have sent the following medications to your pharmacy for you to pick up at your convenience: Ultram 50 mg for pain. You can get over the counter Lidocaine gel 4 % , rub on Right upper side- 3-4 times daily for pain.   No aleve or Ibuprofen.   You have been scheduled for a colonoscopy. Please follow written instructions given to you at your visit today.  Please pick up your prep supplies at the pharmacy within the next 1-3 days. If you use inhalers (even only as needed), please bring them with you on the day of your procedure. Your physician has requested that you go to www.startemmi.com and enter the access code given to you at your visit today. This web site gives a general overview about your procedure. However, you should still follow specific instructions given to you by our office regarding your preparation for the procedure.

## 2016-11-13 NOTE — Progress Notes (Addendum)
Subjective:    Patient ID: [name], female    DOB: 23-Nov-1950, 66 y.o.   MRN: [id number]  HPI [name] is a complicated 66 year old white female, established with Dr. Rhea Cardenas who comes in today with complaints of right upper abdominal pain. Patient says she's been having right-sided pain for the past 4-5 months. She says this is fairly constant and sometimes wakes her up at night. She describes it as a burning type pain, with "burning into my gut". There is some radiation around to her back on the right side. She does not feel that this is exacerbated by any certain positions or movement. Am times seems to be worse after eating. Bowel movements have been normal without any melena or hematochezia. Appetite has been poor, no nausea or vomiting. She says in general she just doesn't feel good. Patient has multiple comorbidities including hypertension, history of a CVA earlier this year with left hemiparesis, GERD, C. difficile colitis 2018, fibromyalgia, patient was hospitalized with a bacterial meningitis in February 2018, and developed C. difficile subsequently. She status post cholecystectomy hysterectomy appendectomy and ovarian cystectomy. She does have remote history of narcotic abuse which she says was 25 years ago. Patient was hospitalized at [facility] in May 2018 with a GI bleed. She had hemoglobin as low as 5. She had had EGD at [facility] done in March 2018 which was normal. She did not undergo any endoscopic evaluation during that brief hospitalization but was told that she needed to have colonoscopy outpatient. Her last episode of C. difficile colitis was in May 2018. She had an admission in July 2018 with abdominal pain at: Hospital. She was seen by GI during that admission and also had elevated LFTs., MRI and MRCP were negative. It was felt that the hepatitis may have been drug-induced from overuse of Tylenol which she was using for her right-sided abdominal pain. Again it was suggested  she would need outpatient colonoscopy. She had ER visit on 10/20/2016 CT of the abdomen and pelvis was done at that time showing an 8 x 6 x 7 mm cystic lesion in the pancreatic head unchanged and a 5 mm cystic lesion in the pancreatic tail, did numerous bilateral renal cysts, no new findings. She says she has not been taking any anti-inflammatories or Tylenol over the past couple of months, she has been on Protonix 40 mg daily chronically.  Review of Systems Pertinent positive and negative review of systems were noted in the above HPI section.  All other review of systems was otherwise negative.  Outpatient Encounter Prescriptions as of 11/13/2016  Medication Sig  . albuterol (PROVENTIL HFA;VENTOLIN HFA) 108 (90 Base) MCG/ACT inhaler Inhale 1-2 puffs into the lungs every 4 (four) hours as needed for wheezing or shortness of breath.  . carisoprodol (SOMA) 350 MG tablet Take 1 tablet (350 mg total) by mouth 3 (three) times daily as needed for muscle spasms.  . carvedilol (COREG) 6.25 MG tablet Take 1 tablet (6.25 mg total) by mouth 2 (two) times daily with a meal.  . fluticasone (FLOVENT HFA) 44 MCG/ACT inhaler Inhale 2 puffs into the lungs 2 (two) times daily.  Marland Kitchen LYRICA 100 MG capsule Take 100 mg by mouth 2 (two) times daily.  . ondansetron (ZOFRAN ODT) 8 MG disintegrating tablet 8mg  ODT q8 hours prn nausea  . pantoprazole (PROTONIX) 40 MG tablet Take 40 mg by mouth daily.  . Na Sulfate-K Sulfate-Mg Sulf 17.5-3.13-1.6 GM/180ML SOLN Take 1 kit by mouth once.  . traMADol (  ULTRAM) 50 MG tablet Take 1 tab every 8-12 hours as needed for pain.  . [DISCONTINUED] vancomycin (VANCOCIN HCL) 125 MG capsule 1 po QOD for 7 days, then 1 po  every 3 days x 14 days (Patient not taking: Reported on 11/13/2016)   No facility-administered encounter medications on file as of 11/13/2016.    Allergies  Allergen Reactions  . Tylenol [Acetaminophen] Other (See Comments)    Inflammed Liver  . Codeine Nausea And Vomiting    . Nsaids Other (See Comments)    Stomach ulcers   Patient Active Problem List   Diagnosis Date Noted  . C. difficile colitis 09/10/2016  . Abdominal pain 09/08/2016  . Acute encephalopathy   . Fever   . Left hemiparesis ([facility])   . Generalized OA   . Fibromyalgia   . Tobacco abuse   . Substance abuse   . Benign essential HTN   . Tachycardia   . Chronic pain syndrome   . Acute blood loss anemia   . Leukocytosis   . Septic thrombophlebitis of sagittal sinus   . Acute respiratory failure ([facility])   . Streptococcus pneumoniae meningitis   . Streptococcal bacteremia   . Meningitis   . Cerebral embolism with cerebral infarction 04/12/2016  . Stroke (cerebrum) ([facility]) 04/12/2016  . Neck pain 04/04/2016  . Chronic abdominal pain 02/19/2016  . Essential hypertension 02/19/2016  . Bacterial conjunctivitis 02/19/2016  . Health care maintenance 02/19/2016  . ANXIETY 05/08/2006  . TOBACCO DEPENDENCE 05/08/2006  . ASTHMA, UNSPECIFIED 05/08/2006  . GASTROESOPHAGEAL REFLUX, NO ESOPHAGITIS 05/08/2006  . CONSTIPATION 05/08/2006  . CONVULSIONS, SEIZURES, NOS 05/08/2006   Social History   Social History  . Marital status: Married    Spouse name: N/A  . Number of children: N/A  . Years of education: N/A   Occupational History  . Not on file.   Social History Main Topics  . Smoking status: Current Some Day Smoker    Packs/day: 0.25    Years: 30.00    Types: Cigarettes  . Smokeless tobacco: Never Used  . Alcohol use No  . Drug use: No     Comment: Methadone for narcotic use  . Sexual activity: Not on file   Other Topics Concern  . Not on file   Social History Narrative  . No narrative on file    [name]'s family history includes Alzheimer's disease in her mother; Heart disease in her father; Prostate cancer in her father.      Objective:    Vitals:   11/13/16 1418  BP: (!) 172/110  Pulse: 96    Physical Exam ;Well-developed 66 year old white female in no acute  distress, height 5 foot 3, weight 1:15, BMI of 20.14, HEENT ;nontraumatic normocephalic EOMI PERRLA sclera anicteric she does have a slight facial droop, Cardiovascular; regular rate and rhythm with S1-S2 no murmur rub or gallop, Pulmonary; clear bilaterally, Abdomen; soft, bowel sounds are present no palpable mass or hepatosplenomegaly she is tender in the right upper quadrant and epigastrium but also tender along the right costal margin, rectal ;exam not done, Extremities; no clubbing cyanosis or edema skin warm and dry, Neuropsych; mood and affect appropriate, gait is abnormal post-CVA       Assessment & Plan:   #21 66 year old white female with persistent right upper quadrant pain 4-5 months, in patient status post cholecystectomy. Very recent CT scan of the abdomen and pelvis unrevealing as to cause. She does have a tiny cystic lesion in the pancreatic  head unchanged (8 x 6 x 7 mm) Patient had negative EGD in March 2018 at the time of a GI bleed with melena Etiology of her pain is unclear, she may have a musculoskeletal component however need to rule out colonic lesion, peptic ulcer disease, #2 history of self-limited GI bleed with melena May 2018-etiology not clear, no colonoscopy pursued-hemoglobin of 5 at nadir, most recent hemoglobin normal #3 C. difficile colitis May 2018 #4 hospitalization July 2018 with abdominal pain, patient had a hepatitis felt to be drug induced at that time secondary to Tylenol, LFTs have since completely normalized #5 history of CVA with left hemiparesis #6 hypertension #7 fibromyalgia #8 history of bacterial meningitis February 2018 #9 remote history of narcotic abuse #10 status post cholecystectomy hysterectomy appendectomy and ovarian cystectomy  Plan; continue Protonix 40 mg by mouth daily Patient will be scheduled for endoscopy and colonoscopy with Dr. Rhea Cardenas. Both procedures discussed in detail with patient including risks and benefits and she is agreeable  to proceed We avoid Tylenol and NSAIDs She will be given a very limited supply of Ultram 50 mg every 6-8 hours as needed for pain #30 and no refills Advised patient to get OTC lidocaine gel 4% to apply locally to her right costal margin. 4 times daily. Further evaluation pending results of above.  Marissa Jesus S Sakura Denis PA-C 11/13/2016   Cc: Shade Flood, MD   Addendum: Reviewed and agree with initial management. Pyrtle, Carie Caddy, MD

## 2016-11-27 ENCOUNTER — Ambulatory Visit: Payer: Medicare Other | Admitting: Neurology

## 2016-11-27 ENCOUNTER — Telehealth: Payer: Self-pay | Admitting: *Deleted

## 2016-11-27 ENCOUNTER — Other Ambulatory Visit: Payer: Self-pay | Admitting: Family Medicine

## 2016-11-27 ENCOUNTER — Encounter: Payer: Self-pay | Admitting: *Deleted

## 2016-11-27 DIAGNOSIS — G243 Spasmodic torticollis: Secondary | ICD-10-CM | POA: Insufficient documentation

## 2016-11-27 NOTE — Telephone Encounter (Signed)
No showed Xeomin appointment.

## 2016-11-28 ENCOUNTER — Encounter: Payer: Self-pay | Admitting: Neurology

## 2016-12-04 ENCOUNTER — Other Ambulatory Visit: Payer: Self-pay | Admitting: Family Medicine

## 2016-12-04 DIAGNOSIS — M62838 Other muscle spasm: Secondary | ICD-10-CM

## 2016-12-04 DIAGNOSIS — M542 Cervicalgia: Secondary | ICD-10-CM

## 2016-12-06 ENCOUNTER — Encounter: Payer: Self-pay | Admitting: *Deleted

## 2016-12-11 NOTE — Telephone Encounter (Signed)
Soma refilled for #30. See last OV for plan.

## 2016-12-12 NOTE — Telephone Encounter (Signed)
Called pt - advised rx faxed to [address]

## 2016-12-16 ENCOUNTER — Ambulatory Visit: Payer: Medicare Other | Admitting: Family Medicine

## 2016-12-20 ENCOUNTER — Encounter: Payer: Medicare Other | Admitting: Internal Medicine

## 2016-12-20 ENCOUNTER — Other Ambulatory Visit: Payer: Self-pay | Admitting: Family Medicine

## 2016-12-23 ENCOUNTER — Telehealth: Payer: Self-pay | Admitting: Internal Medicine

## 2016-12-23 ENCOUNTER — Ambulatory Visit: Payer: Medicare Other | Admitting: Internal Medicine

## 2016-12-23 NOTE — Telephone Encounter (Signed)
Noted  

## 2016-12-28 ENCOUNTER — Encounter (HOSPITAL_COMMUNITY): Payer: Self-pay | Admitting: Emergency Medicine

## 2016-12-28 ENCOUNTER — Emergency Department (HOSPITAL_COMMUNITY)
Admission: EM | Admit: 2016-12-28 | Discharge: 2016-12-28 | Disposition: A | Discharge status: Home / Self Care | Payer: Medicare Other | Attending: Emergency Medicine | Admitting: Emergency Medicine

## 2016-12-28 ENCOUNTER — Emergency Department (HOSPITAL_COMMUNITY): Payer: Medicare Other

## 2016-12-28 DIAGNOSIS — Z859 Personal history of malignant neoplasm, unspecified: Secondary | ICD-10-CM | POA: Insufficient documentation

## 2016-12-28 DIAGNOSIS — K5900 Constipation, unspecified: Secondary | ICD-10-CM | POA: Insufficient documentation

## 2016-12-28 DIAGNOSIS — R1010 Upper abdominal pain, unspecified: Secondary | ICD-10-CM | POA: Diagnosis not present

## 2016-12-28 DIAGNOSIS — Z79899 Other long term (current) drug therapy: Secondary | ICD-10-CM | POA: Insufficient documentation

## 2016-12-28 DIAGNOSIS — I10 Essential (primary) hypertension: Secondary | ICD-10-CM | POA: Diagnosis not present

## 2016-12-28 DIAGNOSIS — R112 Nausea with vomiting, unspecified: Secondary | ICD-10-CM | POA: Diagnosis not present

## 2016-12-28 DIAGNOSIS — J45909 Unspecified asthma, uncomplicated: Secondary | ICD-10-CM | POA: Insufficient documentation

## 2016-12-28 DIAGNOSIS — R109 Unspecified abdominal pain: Secondary | ICD-10-CM | POA: Diagnosis not present

## 2016-12-28 DIAGNOSIS — F17210 Nicotine dependence, cigarettes, uncomplicated: Secondary | ICD-10-CM | POA: Insufficient documentation

## 2016-12-28 LAB — COMPREHENSIVE METABOLIC PANEL
ALT: 13 U/L — ABNORMAL LOW (ref 14–54)
AST: 19 U/L (ref 15–41)
Albumin: 4.4 g/dL (ref 3.5–5.0)
Alkaline Phosphatase: 129 U/L — ABNORMAL HIGH (ref 38–126)
Anion gap: 11 (ref 5–15)
BUN: 15 mg/dL (ref 6–20)
CO2: 24 mmol/L (ref 22–32)
Calcium: 9.5 mg/dL (ref 8.9–10.3)
Chloride: 103 mmol/L (ref 101–111)
Creatinine, Ser: 0.84 mg/dL (ref 0.44–1.00)
GFR calc Af Amer: >60 mL/min (ref >60)
GFR calc non Af Amer: >60 mL/min (ref >60)
Glucose, Bld: 117 mg/dL — ABNORMAL HIGH (ref 65–99)
Potassium: 3.4 mmol/L — ABNORMAL LOW (ref 3.5–5.1)
Sodium: 138 mmol/L (ref 135–145)
Total Bilirubin: 0.6 mg/dL (ref 0.3–1.2)
Total Protein: 7.2 g/dL (ref 6.5–8.1)

## 2016-12-28 LAB — URINALYSIS, ROUTINE W REFLEX MICROSCOPIC
Bilirubin Urine: NEGATIVE
Glucose, UA: NEGATIVE mg/dL
Ketones, ur: NEGATIVE mg/dL
Leukocytes, UA: NEGATIVE
Nitrite: NEGATIVE
Protein, ur: 30 mg/dL — AB
Specific Gravity, Urine: 1.025 (ref 1.005–1.030)
pH: 5 (ref 5.0–8.0)

## 2016-12-28 LAB — CBC
HCT: 43 % (ref 36.0–46.0)
Hemoglobin: 15.3 g/dL — ABNORMAL HIGH (ref 12.0–15.0)
MCH: 31.8 pg (ref 26.0–34.0)
MCHC: 35.6 g/dL (ref 30.0–36.0)
MCV: 89.4 fL (ref 78.0–100.0)
Platelets: 498 10*3/uL — ABNORMAL HIGH (ref 150–400)
RBC: 4.81 MIL/uL (ref 3.87–5.11)
RDW: 13.7 % (ref 11.5–15.5)
WBC: 11.1 10*3/uL — ABNORMAL HIGH (ref 4.0–10.5)

## 2016-12-28 LAB — LIPASE, BLOOD: Lipase: 28 U/L (ref 11–51)

## 2016-12-28 MED ORDER — FENTANYL CITRATE (PF) 100 MCG/2ML IJ SOLN
50.0000 ug | Freq: Once | INTRAMUSCULAR | Status: AC
  Administered 2016-12-28: 50 ug via INTRAVENOUS
  Filled 2016-12-28: qty 2

## 2016-12-28 MED ORDER — PANTOPRAZOLE SODIUM 20 MG PO TBEC: 20.0000 mg | DELAYED_RELEASE_TABLET | Freq: Every day | ORAL | 0 refills | Status: DC

## 2016-12-28 MED ORDER — ONDANSETRON 4 MG PO TBDP: ORAL_TABLET | ORAL | 0 refills | Status: DC

## 2016-12-28 MED ORDER — DOCUSATE SODIUM 100 MG PO CAPS: 100.0000 mg | ORAL_CAPSULE | Freq: Two times a day (BID) | ORAL | 0 refills | Status: DC

## 2016-12-28 MED ORDER — TRAMADOL HCL 50 MG PO TABS: 50.0000 mg | ORAL_TABLET | Freq: Four times a day (QID) | ORAL | 0 refills | Status: DC | PRN

## 2016-12-28 MED ORDER — ONDANSETRON HCL 4 MG/2ML IJ SOLN
4.0000 mg | Freq: Once | INTRAMUSCULAR | Status: AC
  Administered 2016-12-28: 4 mg via INTRAVENOUS
  Filled 2016-12-28: qty 2

## 2016-12-28 MED ORDER — IOPAMIDOL (ISOVUE-300) INJECTION 61%
INTRAVENOUS | Status: AC
  Administered 2016-12-28: 100 mL via INTRAVENOUS
  Filled 2016-12-28: qty 100

## 2016-12-28 MED ORDER — SODIUM CHLORIDE 0.9 % IV BOLUS (SEPSIS)
1000.0000 mL | Freq: Once | INTRAVENOUS | Status: AC
  Administered 2016-12-28: 1000 mL via INTRAVENOUS

## 2016-12-28 NOTE — ED Notes (Signed)
Pt ambulatory to restroom with steady gait; pt has finished contrast bottle #1 and started #2

## 2016-12-28 NOTE — ED Provider Notes (Addendum)
[facility] EMERGENCY DEPARTMENT Provider Note   CSN: [id number] Arrival date & time: 12/28/16  1052     History   Chief Complaint Chief Complaint  Patient presents with  . Abdominal Pain  . Nausea  . Emesis    HPI [name] is a 66 y.o. female.  Patient is a 66 year old female who presents with abdominal pain. She had meningitis previously and since that time developed C. Difficile colitis secondary to the antibiotic usage. She's had some ongoing abdominal problems since that time. She states that she was doing well until about 3-4 weeks ago started having recurrence of her pain. It's mostly in the right upper quadrant but also goes across her upper abdomen. She's had some associated nausea vomiting and also some chills. She denies any diarrhea. No urinary symptoms. No blood in her stool. She hasn't really been taking any medications for this as she states that she can't take Tylenol or NSAIDs. She is status post cholecystectomy about 5 years ago.      Past Medical History:  Diagnosis Date  . Acute blood loss anemia   . Arrhythmia   . Arthritis   . C. difficile colitis   . Cancer ([place])   . Diverticulosis   . Fibromyalgia   . GERD (gastroesophageal reflux disease)   . GI hemorrhage   . H/O: substance abuse 2005   narcotic usage due to chronic back pain  . Heart murmur   . Hypertension   . Pancreatic cyst   . Pneumococcal meningitis   . PONV (postoperative nausea and vomiting)   . Renal cyst   . Stroke (cerebrum) [facility])     Patient Active Problem List   Diagnosis Date Noted  . Cervical dystonia 11/27/2016  . C. difficile colitis 09/10/2016  . Abdominal pain 09/08/2016  . Acute encephalopathy   . Fever   . Left hemiparesis ([place])   . Generalized OA   . Fibromyalgia   . Tobacco abuse   . Substance abuse ([place])   . Benign essential HTN   . Tachycardia   . Chronic pain syndrome   . Acute blood loss anemia   . Leukocytosis   . Septic  thrombophlebitis of sagittal sinus   . Acute respiratory failure ([place])   . Streptococcus pneumoniae meningitis   . Streptococcal bacteremia   . Meningitis   . Cerebral embolism with cerebral infarction 04/12/2016  . Stroke (cerebrum) ([place]) 04/12/2016  . Neck pain 04/04/2016  . Chronic abdominal pain 02/19/2016  . Essential hypertension 02/19/2016  . Bacterial conjunctivitis 02/19/2016  . Health care maintenance 02/19/2016  . ANXIETY 05/08/2006  . TOBACCO DEPENDENCE 05/08/2006  . ASTHMA, UNSPECIFIED 05/08/2006  . GASTROESOPHAGEAL REFLUX, NO ESOPHAGITIS 05/08/2006  . CONSTIPATION 05/08/2006  . CONVULSIONS, SEIZURES, NOS 05/08/2006    Past Surgical History:  Procedure Laterality Date  . ABDOMINAL HYSTERECTOMY    . ABDOMINAL SURGERY    . APPENDECTOMY    . BREAST SURGERY    . CHOLECYSTECTOMY N/A 09/30/2012   Procedure: LAPAROSCOPIC CHOLECYSTECTOMY WITH INTRAOPERATIVE CHOLANGIOGRAM;  Surgeon: Imogene Burn. Georgette Dover, MD;  Location: [facility];  Service: General;  Laterality: N/A;  . FRACTURE SURGERY     right upper arm  . OVARIAN CYST SURGERY      OB History    No data available       Home Medications    Prior to Admission medications   Medication Sig Start Date End Date Taking? Authorizing Provider  albuterol (PROVENTIL HFA;VENTOLIN HFA) 108 (  90 Base) MCG/ACT inhaler Inhale 1-2 puffs into the lungs every 4 (four) hours as needed for wheezing or shortness of breath. 09/06/16  Yes [name], MD  carvedilol (COREG) 6.25 MG tablet Take 1 tablet (6.25 mg total) by mouth 2 (two) times daily with a meal. Call for appt for follow up in Oct.2018 11/28/16  Yes [name], MD  cyclobenzaprine (FLEXERIL) 10 MG tablet Take 20 mg by mouth 3 (three) times daily as needed for muscle spasms. 12/11/16  Yes [provider]  fluticasone (FLOVENT HFA) 44 MCG/ACT inhaler Inhale 2 puffs into the lungs 2 (two) times daily. Patient taking differently: Inhale 2 puffs into the lungs 2 (two)  times daily as needed (shortness of breath).  10/14/16  Yes [name], MD  LYRICA 100 MG capsule Take 100 mg by mouth 2 (two) times daily. 10/18/16  Yes [provider]  carisoprodol (SOMA) 350 MG tablet TAKE 1 TABLET BY MOUTH THREE TIMES DAILY AS NEEDED FOR MUSCLE SPASMS Patient not taking: Reported on 12/28/2016 12/11/16   [name], MD  docusate sodium (COLACE) 100 MG capsule Take 1 capsule (100 mg total) by mouth every 12 (twelve) hours. 12/28/16   Malvin Johns, MD  ondansetron (ZOFRAN ODT) 4 MG disintegrating tablet 4mg  ODT q4 hours prn nausea/vomit 12/28/16   Malvin Johns, MD  ondansetron (ZOFRAN ODT) 8 MG disintegrating tablet 8mg  ODT q8 hours prn nausea Patient not taking: Reported on 12/28/2016 10/20/16   Orlie Dakin, MD  traMADol (ULTRAM) 50 MG tablet Take 1 tab every 8-12 hours as needed for pain. Patient not taking: Reported on 12/28/2016 11/13/16   Esterwood, Amy S, PA-C  traMADol (ULTRAM) 50 MG tablet Take 1 tablet (50 mg total) by mouth every 6 (six) hours as needed. 12/28/16   Malvin Johns, MD    Family History Family History  Problem Relation Age of Onset  . Alzheimer's disease Mother   . Heart disease Father   . Prostate cancer Father     Social History Social History  Substance Use Topics  . Smoking status: Current Some Day Smoker    Packs/day: 0.25    Years: 30.00    Types: Cigarettes  . Smokeless tobacco: Never Used  . Alcohol use No     Allergies   Tylenol [acetaminophen]; Codeine; and Nsaids   Review of Systems Review of Systems  Constitutional: Positive for chills and fatigue. Negative for diaphoresis and fever.  HENT: Negative for congestion, rhinorrhea and sneezing.   Eyes: Negative.   Respiratory: Negative for cough, chest tightness and shortness of breath.   Cardiovascular: Negative for chest pain and leg swelling.  Gastrointestinal: Positive for abdominal pain, nausea and vomiting. Negative for blood in stool and  diarrhea.  Genitourinary: Negative for difficulty urinating, flank pain, frequency and hematuria.  Musculoskeletal: Negative for arthralgias and back pain.  Skin: Negative for rash.  Neurological: Negative for dizziness, speech difficulty, weakness, numbness and headaches.     Physical Exam Updated Vital Signs BP (!) 168/92   Pulse 85   Temp 97.7 F (36.5 C) (Oral)   Resp 16   Ht 5\' 4"  (1.626 m)   Wt 49.9 kg (110 lb)   SpO2 97%   BMI 18.88 kg/m   Physical Exam  Constitutional: She is oriented to person, place, and time. She appears well-developed and well-nourished.  HENT:  Head: Normocephalic and atraumatic.  Eyes: Pupils are equal, round, and reactive to light.  Neck: Normal range of motion.  Neck supple.  Cardiovascular: Normal rate, regular rhythm and normal heart sounds.   Pulmonary/Chest: Effort normal and breath sounds normal. No respiratory distress. She has no wheezes. She has no rales. She exhibits no tenderness.  Abdominal: Soft. Bowel sounds are normal. There is tenderness (tenderness across upper abdomen). There is no rebound and no guarding.  Musculoskeletal: Normal range of motion. She exhibits no edema.  Lymphadenopathy:    She has no cervical adenopathy.  Neurological: She is alert and oriented to person, place, and time.  Skin: Skin is warm and dry. No rash noted.  Psychiatric: She has a normal mood and affect.     ED Treatments / Results  Labs (all labs ordered are listed, but only abnormal results are displayed) Labs Reviewed  COMPREHENSIVE METABOLIC PANEL - Abnormal; Notable for the following:       Result Value   Potassium 3.4 (*)    Glucose, Bld 117 (*)    ALT 13 (*)    Alkaline Phosphatase 129 (*)    All other components within normal limits  CBC - Abnormal; Notable for the following:    WBC 11.1 (*)    Hemoglobin 15.3 (*)    Platelets 498 (*)    All other components within normal limits  URINALYSIS, ROUTINE W REFLEX MICROSCOPIC -  Abnormal; Notable for the following:    APPearance HAZY (*)    Hgb urine dipstick SMALL (*)    Protein, ur 30 (*)    Bacteria, UA RARE (*)    Squamous Epithelial / LPF 6-30 (*)    All other components within normal limits  LIPASE, BLOOD    EKG  EKG Interpretation None       Radiology Ct Abdomen Pelvis W Contrast  Result Date: 12/28/2016 CLINICAL DATA:  Right-sided abdominal and pelvic pain for 2 weeks. Nausea and vomiting. EXAM: CT ABDOMEN AND PELVIS WITH CONTRAST TECHNIQUE: Multidetector CT imaging of the abdomen and pelvis was performed using the standard protocol following bolus administration of intravenous contrast. CONTRAST:  164mL ISOVUE-300 IOPAMIDOL (ISOVUE-300) INJECTION 61% COMPARISON:  10/20/2016 and 01/22/2007 FINDINGS: Lower Chest: No acute findings. Hepatobiliary: No hepatic masses identified. Prior cholecystectomy. Stable mild diffuse biliary ductal dilatation. Pancreas: Stable 9 mm cyst in the pancreatic head dating back to 2008, consistent with benign etiology. One or 2 other tiny less than 5 mm cysts also seen in the pancreatic body. No solid pancreatic masses or pancreatic ductal dilatation. No evidence of peripancreatic inflammatory changes or fluid collections. Spleen: Within normal limits in size and appearance. Adrenals/Urinary Tract: No masses identified. Small bilateral renal cysts again noted. No evidence of hydronephrosis. Unremarkable unopacified urinary bladder. Stomach/Bowel: No evidence of obstruction, inflammatory process or abnormal fluid collections. Large colonic stool burden noted. Vascular/Lymphatic: No pathologically enlarged lymph nodes. No abdominal aortic aneurysm. Aortic atherosclerosis. Reproductive: Prior hysterectomy noted. Adnexal regions are unremarkable in appearance. Other:  None. Musculoskeletal:  No suspicious bone lesions identified. IMPRESSION: No acute findings. Large stool burden noted; recommend clinical correlation for possible  constipation. Electronically Signed   By: Earle Gell M.D.   On: 12/28/2016 16:53    Procedures Procedures (including critical care time)  Medications Ordered in ED Medications  sodium chloride 0.9 % bolus 1,000 mL (0 mLs Intravenous Stopped 12/28/16 1509)  ondansetron (ZOFRAN) injection 4 mg (4 mg Intravenous Given 12/28/16 1345)  fentaNYL (SUBLIMAZE) injection 50 mcg (50 mcg Intravenous Given 12/28/16 1346)  ondansetron (ZOFRAN) injection 4 mg (4 mg Intravenous Given 12/28/16 1551)  fentaNYL (SUBLIMAZE)  injection 50 mcg (50 mcg Intravenous Given 12/28/16 1551)  iopamidol (ISOVUE-300) 61 % injection (100 mLs Intravenous Contrast Given 12/28/16 1619)     Initial Impression / Assessment and Plan / ED Course  I have reviewed the triage vital signs and the nursing notes.  Pertinent labs & imaging results that were available during my care of the patient were reviewed by me and considered in my medical decision making (see chart for details).     Patient is a 66 year old female who presents with upper abdominal pain.  She has no evidence of hepatitis. No pancreatitis. Her CT scan shows no acute abnormalities other than constipation. Her pain has improved with treatment in the ED. Her tachycardia has improved with IV fluids. She was discharged home in good condition. She was given prescriptions for tramadol, Zofran and Colace. She was also started on Protonix. She is followed by [facility] gastroenterology and I encouraged her to have follow-up with them. Return precautions were given. Of note she doesn't have a history of seizures. She had a seizure during her episode of meningitis but no ongoing seizure side fill the tramadol is appropriate.  Final Clinical Impressions(s) / ED Diagnoses   Final diagnoses:  Pain of upper abdomen  Constipation, unspecified constipation type    New Prescriptions New Prescriptions   DOCUSATE SODIUM (COLACE) 100 MG CAPSULE    Take 1 capsule (100 mg total) by  mouth every 12 (twelve) hours.   ONDANSETRON (ZOFRAN ODT) 4 MG DISINTEGRATING TABLET    4mg  ODT q4 hours prn nausea/vomit   TRAMADOL (ULTRAM) 50 MG TABLET    Take 1 tablet (50 mg total) by mouth every 6 (six) hours as needed.     Malvin Johns, MD 12/28/16 0349    Malvin Johns, MD 12/28/16 [phone]

## 2016-12-28 NOTE — ED Triage Notes (Signed)
Pt. Stated, I think my collitis has flared up. Im having stomach pain with N/V for 2 weeks.  Im suppose to have a colonoscopy and endoscopy but Donnald Garre been too sick.

## 2017-01-08 ENCOUNTER — Other Ambulatory Visit: Payer: Self-pay | Admitting: Neurology

## 2017-01-10 ENCOUNTER — Emergency Department (HOSPITAL_COMMUNITY)
Admission: EM | Admit: 2017-01-10 | Discharge: 2017-01-10 | Disposition: A | Discharge status: Home / Self Care | Payer: Medicare Other | Attending: Emergency Medicine | Admitting: Emergency Medicine

## 2017-01-10 ENCOUNTER — Emergency Department (HOSPITAL_COMMUNITY): Payer: Medicare Other

## 2017-01-10 ENCOUNTER — Encounter (HOSPITAL_COMMUNITY): Payer: Self-pay | Admitting: Emergency Medicine

## 2017-01-10 DIAGNOSIS — R0602 Shortness of breath: Secondary | ICD-10-CM | POA: Diagnosis not present

## 2017-01-10 DIAGNOSIS — Z5321 Procedure and treatment not carried out due to patient leaving prior to being seen by health care provider: Secondary | ICD-10-CM | POA: Insufficient documentation

## 2017-01-10 DIAGNOSIS — R079 Chest pain, unspecified: Secondary | ICD-10-CM | POA: Insufficient documentation

## 2017-01-10 DIAGNOSIS — R109 Unspecified abdominal pain: Secondary | ICD-10-CM | POA: Diagnosis present

## 2017-01-10 LAB — CBC
HCT: 42.9 % (ref 36.0–46.0)
Hemoglobin: 14.5 g/dL (ref 12.0–15.0)
MCH: 30.5 pg (ref 26.0–34.0)
MCHC: 33.8 g/dL (ref 30.0–36.0)
MCV: 90.3 fL (ref 78.0–100.0)
Platelets: 383 10*3/uL (ref 150–400)
RBC: 4.75 MIL/uL (ref 3.87–5.11)
RDW: 15 % (ref 11.5–15.5)
WBC: 9.5 10*3/uL (ref 4.0–10.5)

## 2017-01-10 LAB — I-STAT TROPONIN, ED: Troponin i, poc: 0 ng/mL (ref 0.00–0.08)

## 2017-01-10 LAB — BASIC METABOLIC PANEL
Anion gap: 7 (ref 5–15)
BUN: 13 mg/dL (ref 6–20)
CO2: 25 mmol/L (ref 22–32)
Calcium: 9.9 mg/dL (ref 8.9–10.3)
Chloride: 108 mmol/L (ref 101–111)
Creatinine, Ser: 0.79 mg/dL (ref 0.44–1.00)
GFR calc Af Amer: >60 mL/min (ref >60)
GFR calc non Af Amer: >60 mL/min (ref >60)
Glucose, Bld: 110 mg/dL — ABNORMAL HIGH (ref 65–99)
Potassium: 4.5 mmol/L (ref 3.5–5.1)
Sodium: 140 mmol/L (ref 135–145)

## 2017-01-10 LAB — LIPASE, BLOOD: Lipase: 32 U/L (ref 11–51)

## 2017-01-10 NOTE — ED Triage Notes (Signed)
Pt to ER for evaluation of generalized abdominal pain, left chest pain and shortness of breath that has been going on for "10 months or so." pt in NAD at triage. States has been vomiting for 10 months also.

## 2017-01-10 NOTE — ED Notes (Signed)
Pt called several times with no response.

## 2017-01-16 DIAGNOSIS — J209 Acute bronchitis, unspecified: Secondary | ICD-10-CM | POA: Diagnosis not present

## 2017-01-16 DIAGNOSIS — R05 Cough: Secondary | ICD-10-CM | POA: Diagnosis not present

## 2017-01-16 DIAGNOSIS — R1084 Generalized abdominal pain: Secondary | ICD-10-CM | POA: Diagnosis not present

## 2017-01-16 DIAGNOSIS — G243 Spasmodic torticollis: Secondary | ICD-10-CM | POA: Diagnosis not present

## 2017-01-27 ENCOUNTER — Encounter: Payer: Self-pay | Admitting: Family Medicine

## 2017-01-27 ENCOUNTER — Other Ambulatory Visit: Payer: Self-pay

## 2017-01-27 ENCOUNTER — Ambulatory Visit (INDEPENDENT_AMBULATORY_CARE_PROVIDER_SITE_OTHER): Payer: Medicare Other | Admitting: Family Medicine

## 2017-01-27 VITALS — BP 138/84 | HR 93 | Temp 98.2°F | Resp 18 | Ht 64.0 in | Wt 110.4 lb

## 2017-01-27 DIAGNOSIS — R062 Wheezing: Secondary | ICD-10-CM | POA: Diagnosis not present

## 2017-01-27 DIAGNOSIS — R059 Cough, unspecified: Secondary | ICD-10-CM

## 2017-01-27 DIAGNOSIS — I639 Cerebral infarction, unspecified: Secondary | ICD-10-CM

## 2017-01-27 DIAGNOSIS — J45901 Unspecified asthma with (acute) exacerbation: Secondary | ICD-10-CM | POA: Diagnosis not present

## 2017-01-27 DIAGNOSIS — J45909 Unspecified asthma, uncomplicated: Secondary | ICD-10-CM

## 2017-01-27 DIAGNOSIS — M62838 Other muscle spasm: Secondary | ICD-10-CM | POA: Diagnosis not present

## 2017-01-27 DIAGNOSIS — I10 Essential (primary) hypertension: Secondary | ICD-10-CM

## 2017-01-27 DIAGNOSIS — R739 Hyperglycemia, unspecified: Secondary | ICD-10-CM

## 2017-01-27 DIAGNOSIS — R05 Cough: Secondary | ICD-10-CM

## 2017-01-27 DIAGNOSIS — M542 Cervicalgia: Secondary | ICD-10-CM | POA: Diagnosis not present

## 2017-01-27 DIAGNOSIS — Z720 Tobacco use: Secondary | ICD-10-CM

## 2017-01-27 LAB — GLUCOSE, POCT (MANUAL RESULT ENTRY): POC Glucose: 104 mg/dl — AB (ref 70–99)

## 2017-01-27 MED ORDER — CARISOPRODOL 350 MG PO TABS: 350.0000 mg | ORAL_TABLET | Freq: Three times a day (TID) | ORAL | 0 refills | Status: DC | PRN

## 2017-01-27 MED ORDER — PREDNISONE 20 MG PO TABS: 40.0000 mg | ORAL_TABLET | Freq: Every day | ORAL | 0 refills | Status: DC

## 2017-01-27 MED ORDER — ALBUTEROL SULFATE (2.5 MG/3ML) 0.083% IN NEBU
2.5000 mg | INHALATION_SOLUTION | Freq: Once | RESPIRATORY_TRACT | Status: AC
  Administered 2017-01-27: 2.5 mg via RESPIRATORY_TRACT

## 2017-01-27 MED ORDER — DOXYCYCLINE HYCLATE 100 MG PO TABS: 100.0000 mg | ORAL_TABLET | Freq: Two times a day (BID) | ORAL | 0 refills | Status: DC

## 2017-01-27 MED ORDER — FLUTICASONE PROPIONATE HFA 44 MCG/ACT IN AERO: 2.0000 | INHALATION_SPRAY | Freq: Two times a day (BID) | RESPIRATORY_TRACT | 3 refills | Status: DC

## 2017-01-27 MED ORDER — ALBUTEROL SULFATE HFA 108 (90 BASE) MCG/ACT IN AERS: 1.0000 | INHALATION_SPRAY | RESPIRATORY_TRACT | 0 refills | Status: DC | PRN

## 2017-01-27 MED ORDER — IPRATROPIUM BROMIDE 0.02 % IN SOLN
0.5000 mg | Freq: Once | RESPIRATORY_TRACT | Status: AC
  Administered 2017-01-27: 0.5 mg via RESPIRATORY_TRACT

## 2017-01-27 NOTE — Patient Instructions (Addendum)
I wrote a short term refill of soma for now, but further treatment of your neck pain will need to be managed by your orthopaedist or neurosurgeon. I will refer you to [facility] for 2nd opinion, but with worsening symptoms, may need to proceed with MRI that had been ordered by Dr. Jaynee Eagles.   You can discuss obtaining that test with her office.  If any weakness, go to the emergency room. Additionally if you have new headaches, fever, or any other worsening symptoms, proceed to the emergency room for further evaluation.  Follow up with gastroenterologist for abdominal pain. Return to the clinic or go to the nearest emergency room if any of your symptoms worsen or new symptoms occur. If diarrhea returns, return here or emergency room due to your previous history of C Diff.   Quitting smoking is very important for your medical problems as well as potential wound healing if you do need surgery. [facility] offers smoking cessation clinics. Registration is required. To register call [phone] or register online at [email or web address]   start prednisone and doxycycline for current asthmatic bronchitis. Restart  Flovent inhaler 2 puffs twice per day, albuterol if needed. If you require albuterol more than once or twice per day, or persistently need albuterol in the next 3-4 days, return for recheck.  Return to the clinic or go to the nearest emergency room if any of your symptoms worsen or new symptoms occur.   Steps to Quit Smoking Smoking tobacco can be bad for your health. It can also affect almost every organ in your body. Smoking puts you and people around you at risk for many serious long-lasting (chronic) diseases. Quitting smoking is hard, but it is one of the best things that you can do for your health. It is never too late to quit. What are the benefits of quitting smoking? When you quit smoking, you lower your risk for getting serious diseases and conditions. They can include:  Lung cancer  or lung disease.  Heart disease.  Stroke.  Heart attack.  Not being able to have children (infertility).  Weak bones (osteoporosis) and broken bones (fractures).  If you have coughing, wheezing, and shortness of breath, those symptoms may get better when you quit. You may also get sick less often. If you are pregnant, quitting smoking can help to lower your chances of having a baby of low birth weight. What can I do to help me quit smoking? Talk with your doctor about what can help you quit smoking. Some things you can do (strategies) include:  Quitting smoking totally, instead of slowly cutting back how much you smoke over a period of time.  Going to in-person counseling. You are more likely to quit if you go to many counseling sessions.  Using resources and support systems, such as: ? Database administrator with a Social worker. ? Phone quitlines. ? Careers information officer. ? Support groups or group counseling. ? Text messaging programs. ? Mobile phone apps or applications.  Taking medicines. Some of these medicines may have nicotine in them. If you are pregnant or breastfeeding, do not take any medicines to quit smoking unless your doctor says it is okay. Talk with your doctor about counseling or other things that can help you.  Talk with your doctor about using more than one strategy at the same time, such as taking medicines while you are also going to in-person counseling. This can help make quitting easier. What things can I do to make it easier to  quit? Quitting smoking might feel very hard at first, but there is a lot that you can do to make it easier. Take these steps:  Talk to your family and friends. Ask them to support and encourage you.  Call phone quitlines, reach out to support groups, or work with a Social worker.  Ask people who smoke to not smoke around you.  Avoid places that make you want (trigger) to smoke, such as: ? Bars. ? Parties. ? Smoke-break areas at  work.  Spend time with people who do not smoke.  Lower the stress in your life. Stress can make you want to smoke. Try these things to help your stress: ? Getting regular exercise. ? Deep-breathing exercises. ? Yoga. ? Meditating. ? Doing a body scan. To do this, close your eyes, focus on one area of your body at a time from head to toe, and notice which parts of your body are tense. Try to relax the muscles in those areas.  Download or buy apps on your mobile phone or tablet that can help you stick to your quit plan. There are many free apps, such as QuitGuide from the [employer] Office manager for Disease Control and Prevention). You can find more support from smokefree.gov and other websites.  This information is not intended to replace advice given to you by your health care provider. Make sure you discuss any questions you have with your health care provider. Document Released: 12/22/2008 Document Revised: 10/24/2015 Document Reviewed: 07/12/2014 Elsevier Interactive Patient Education  2018 [employer].   IF you received an x-ray today, you will receive an invoice from [facility]. Please contact [facility] at [phone] with questions or concerns regarding your invoice.   IF you received labwork today, you will receive an invoice from [facility]. Please contact [facility] at [phone] with questions or concerns regarding your invoice.   Our billing staff will not be able to assist you with questions regarding bills from these companies.  You will be contacted with the lab results as soon as they are available. The fastest way to get your results is to activate your My Chart account. Instructions are located on the last page of this paperwork. If you have not heard from [place] regarding the results in 2 weeks, please contact this office.

## 2017-01-27 NOTE — Progress Notes (Signed)
Subjective:  By signing my name below, I, [name], attest that this documentation has been prepared under the direction and in the presence of Marissa Ray, MD. Electronically Signed: Moises Cardenas, [place]. 01/27/2017 , 10:43 AM .  Patient was seen in Room 12 .   Patient ID: [name], female    DOB: Jan 14, 1951, 66 y.o.   MRN: [id number] Chief Complaint  Patient presents with  . Medication Refill    patient states that she needs refills of all medications, has been out x 3-4 weeks  . Cough    patient seen by Urgent Care last week, has not filled rx for antibiotic   HPI [name] is a 66 y.o. female  Here for multiple concerns today. Last office visit on Aug 6th, advised to follow up in 4-6 weeks at that time, but has not been seen since then.  Over 40 minutes of face-to-face care, including repeat evaluation of initial history.   Cough Patient was reportedly seen at Urgent Care recently. She's been prescribed Flovent and Albuterol previously for asthma. She was only using 1 puff twice a day at Aug visit; advised to increase to 2 puffs if increase albuterol need.   Patient reports having productive cough that started 2 months ago. She describes discolored mucus being dark yellow, without any improvement. She's noticed some wheezing with this. She denies any fevers. She notes she's been out of her Albuterol and Flovent inhalers due to cost for about 4 weeks now. She states she has refills but isn't able to afford them. She's still smoking about 3-4 cigarettes a day.   She was seen at an Urgent Care walk-in clinic on [address] a week ago, and was diagnosed with acute bronchitis. She had chest xray done. She was given an injection of antibiotics and an injection of steroids; not sure if improved with injections because she still feels about the same. She was also prescribed antibiotics, which she didn't fill.   Her xray was done on Nov 6th; report from [facility]  center showed COPD without definite acute pulmonary disease; hyperexpansion but no infiltrates.   Neck pain with muscle spasms See previous evaluations and treatment. She had been referred to both neurology and orthopedist, plan for possible Botox injection as well as surgery for DDD. Medication refill request on Aug 31st. Soma was refilled temporarily but asked to follow up with neurology for treatment as planned; appears she did not show up for her appointment on Sept 19th for Xeomin with neurology. She was using Soma 2-3 times per day when discussed in August. Lyrica was not tolerated due to side effects and cost, discussed possibly starting gabapentin, but could be decided by neurology. 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   Patient mentions she's only been receiving Botox injections. She saw [name] at [place], and feeling bad on her stomach. The plan with [name] was to have surgery, but patient wants a second opinion by neurosurgeon. She notes symptoms worsening as there's pain and numbness going down her legs now for the past few months, as well as spine. She denies weakness in her legs, urinary or bowel incontinence, or saddle anesthesia. She reports having relief with Soma. She's been taking Lyrica 100mg  BID, prescribed by [name] neurology, which gives her minimal relief of her pain but Soma worked better.   Recurrent abdominal pain She  was seen by GI on Sept 5th with plan for endoscopy and colonoscopy, given a short term prescription of tramadol. She was continued on protonix. She has an appointment with GI on Dec 16th. She's still taking protonix. She notes the abdominal pain is still present.   HTN Lab Results  Component Value Date   CREATININE 0.79 01/10/2017   She's been taking Coreg 6.25mg  BID. She's been out of it  for about a month now. She was doing well on it.   Hyperglycemia Her glucose was mildly elevated on previous CMP and BMP.   Patient Active Problem List   Diagnosis Date Noted  . Cervical dystonia 11/27/2016  . C. difficile colitis 09/10/2016  . Abdominal pain 09/08/2016  . Acute encephalopathy   . Fever   . Left hemiparesis ([place])   . Generalized OA   . Fibromyalgia   . Tobacco abuse   . Substance abuse ([place])   . Benign essential HTN   . Tachycardia   . Chronic pain syndrome   . Acute Cardenas loss anemia   . Leukocytosis   . Septic thrombophlebitis of sagittal sinus   . Acute respiratory failure ([place])   . Streptococcus pneumoniae meningitis   . Streptococcal bacteremia   . Meningitis   . Cerebral embolism with cerebral infarction 04/12/2016  . Stroke (cerebrum) ([place]) 04/12/2016  . Neck pain 04/04/2016  . Chronic abdominal pain 02/19/2016  . Essential hypertension 02/19/2016  . Bacterial conjunctivitis 02/19/2016  . Health care maintenance 02/19/2016  . ANXIETY 05/08/2006  . TOBACCO DEPENDENCE 05/08/2006  . ASTHMA, UNSPECIFIED 05/08/2006  . GASTROESOPHAGEAL REFLUX, NO ESOPHAGITIS 05/08/2006  . CONSTIPATION 05/08/2006  . CONVULSIONS, SEIZURES, NOS 05/08/2006   Past Medical History:  Diagnosis Date  . Acute Cardenas loss anemia   . Arrhythmia   . Arthritis   . C. difficile colitis   . Cancer ([place])   . Diverticulosis   . Fibromyalgia   . GERD (gastroesophageal reflux disease)   . GI hemorrhage   . H/O: substance abuse 2005   narcotic usage due to chronic back pain  . Heart murmur   . Hypertension   . Pancreatic cyst   . Pneumococcal meningitis   . PONV (postoperative nausea and vomiting)   . Renal cyst   . Stroke (cerebrum) [facility])    Past Surgical History:  Procedure Laterality Date  . ABDOMINAL HYSTERECTOMY    . ABDOMINAL SURGERY    . APPENDECTOMY    . BREAST SURGERY    . FRACTURE SURGERY     right upper arm  . LAPAROSCOPIC CHOLECYSTECTOMY WITH  INTRAOPERATIVE CHOLANGIOGRAM N/A 09/30/2012   Performed by Maia Petties., MD at [facility]  . OVARIAN CYST SURGERY     Allergies  Allergen Reactions  . Tylenol [Acetaminophen] Other (See Comments)    Inflammed Liver  . Codeine Nausea And Vomiting  . Nsaids Other (See Comments)    Stomach ulcers   Prior to Admission medications   Medication Sig Start Date End Date Taking? Authorizing Provider  albuterol (PROVENTIL HFA;VENTOLIN HFA) 108 (90 Base) MCG/ACT inhaler Inhale 1-2 puffs into the lungs every 4 (four) hours as needed for wheezing or shortness of breath. 09/06/16   [name], MD  carisoprodol (SOMA) 350 MG tablet TAKE 1 TABLET BY MOUTH THREE TIMES DAILY AS NEEDED FOR MUSCLE SPASMS Patient not taking: Reported on 12/28/2016 12/11/16   [name], MD  carvedilol (COREG) 6.25 MG tablet Take 1 tablet (6.25 mg total) by mouth  2 (two) times daily with a meal. Call for appt for follow up in Oct.2018 11/28/16   [name], MD  cyclobenzaprine (FLEXERIL) 10 MG tablet Take 20 mg by mouth 3 (three) times daily as needed for muscle spasms. 12/11/16   [provider]  docusate sodium (COLACE) 100 MG capsule Take 1 capsule (100 mg total) by mouth every 12 (twelve) hours. 12/28/16   Malvin Johns, MD  fluticasone (FLOVENT HFA) 44 MCG/ACT inhaler Inhale 2 puffs into the lungs 2 (two) times daily. Patient taking differently: Inhale 2 puffs into the lungs 2 (two) times daily as needed (shortness of breath).  10/14/16   [name], MD  LYRICA 100 MG capsule Take 100 mg by mouth 2 (two) times daily. 10/18/16   [provider]  ondansetron (ZOFRAN ODT) 4 MG disintegrating tablet 4mg  ODT q4 hours prn nausea/vomit 12/28/16   Malvin Johns, MD  ondansetron (ZOFRAN ODT) 8 MG disintegrating tablet 8mg  ODT q8 hours prn nausea Patient not taking: Reported on 12/28/2016 10/20/16   Orlie Dakin, MD  pantoprazole (PROTONIX) 20 MG tablet Take 1 tablet (20 mg total) by mouth  daily. 12/28/16   Malvin Johns, MD  traMADol (ULTRAM) 50 MG tablet Take 1 tab every 8-12 hours as needed for pain. Patient not taking: Reported on 12/28/2016 11/13/16   Esterwood, Amy S, PA-C  traMADol (ULTRAM) 50 MG tablet Take 1 tablet (50 mg total) by mouth every 6 (six) hours as needed. 12/28/16   Malvin Johns, MD   Social History   Socioeconomic History  . Marital status: Married    Spouse name: Not on file  . Number of children: Not on file  . Years of education: Not on file  . Highest education level: Not on file  Social Needs  . Financial resource strain: Not on file  . Food insecurity - worry: Not on file  . Food insecurity - inability: Not on file  . Transportation needs - medical: Not on file  . Transportation needs - non-medical: Not on file  Occupational History  . Not on file  Tobacco Use  . Smoking status: Current Some Day Smoker    Packs/day: 0.25    Years: 30.00    Pack years: 7.50    Types: Cigarettes  . Smokeless tobacco: Never Used  Substance and Sexual Activity  . Alcohol use: No  . Drug use: No    Comment: Methadone for narcotic use  . Sexual activity: Not on file  Other Topics Concern  . Not on file  Social History Narrative  . Not on file   Review of Systems  Constitutional: Negative for fatigue, fever and unexpected weight change.  Respiratory: Positive for cough and wheezing. Negative for chest tightness.   Cardiovascular: Negative for chest pain, palpitations and leg swelling.  Gastrointestinal: Positive for abdominal pain. Negative for Cardenas in stool.  Musculoskeletal: Positive for neck pain.  Neurological: Negative for dizziness, syncope, light-headedness and headaches.       Objective:   Physical Exam  Constitutional: She is oriented to person, place, and time. She appears well-developed and well-nourished.  HENT:  Head: Normocephalic and atraumatic.  Eyes: Conjunctivae and EOM are normal. Pupils are equal, round, and reactive to  light.  Neck: Carotid bruit is not present.  Cardiovascular: Normal rate, regular rhythm, normal heart sounds and intact distal pulses.  Pulmonary/Chest: Effort normal. She has wheezes (inspiratory and expiratory).  (11:37AM) after albuterol and atrovent neb, inspiratory wheeze resolved; very faint end  expiratory wheeze; symptomatically feels better  Abdominal: Soft. She exhibits no pulsatile midline mass. There is no tenderness.  Musculoskeletal:  Negative straight leg raise bilaterally; guarded ROM of her neck  Neurological: She is alert and oriented to person, place, and time. She displays no Babinski's sign on the right side. She displays no Babinski's sign on the left side.  Reflex Scores:      Tricep reflexes are 2+ on the right side and 2+ on the left side.      Bicep reflexes are 2+ on the right side and 2+ on the left side.      Brachioradialis reflexes are 2+ on the right side and 2+ on the left side.      Patellar reflexes are 2+ on the right side and 2+ on the left side.      Achilles reflexes are 2+ on the right side and 2+ on the left side. Able to heel-toe walk  Skin: Skin is warm and dry.  Psychiatric: She has a normal mood and affect. Her behavior is normal.  Vitals reviewed.   Vitals:   01/27/17 0931  BP: 138/84  Pulse: 93  Resp: 18  Temp: 98.2 F (36.8 C)  TempSrc: Oral  SpO2: 98%  Weight: 110 lb 6.4 oz (50.1 kg)  Height: 5\' 4"  (1.626 m)   Results for orders placed or performed in visit on 01/27/17  POCT glucose (manual entry)  Result Value Ref Range   POC Glucose 104 (A) 70 - 99 mg/dl       Assessment & Plan:    Latorya Bautch is a 66 y.o. female Asthmatic bronchitis with acute exacerbation, unspecified asthma severity, unspecified whether persistent - Plan: predniSONE (DELTASONE) 20 MG tablet, doxycycline (VIBRA-TABS) 100 MG tablet Cough - Plan: albuterol (PROVENTIL HFA;VENTOLIN HFA) 108 (90 Base) MCG/ACT inhaler, CBC Tobacco abuse Uncomplicated  asthma, unspecified asthma severity, unspecified whether persistent - Plan: fluticasone (FLOVENT HFA) 44 MCG/ACT inhaler, albuterol (PROVENTIL) (2.5 MG/3ML) 0.083% nebulizer solution 2.5 mg, ipratropium (ATROVENT) nebulizer solution 0.5 mg Wheezing - Plan: albuterol (PROVENTIL HFA;VENTOLIN HFA) 108 (90 Base) MCG/ACT inhaler, albuterol (PROVENTIL) (2.5 MG/3ML) 0.083% nebulizer solution 2.5 mg, ipratropium (ATROVENT) nebulizer solution 0.5 mg  -  Persistent asthma symptoms versus component of COPD with tobacco abuse. Stressed importance of complete cessation of tobacco.  -Symptoms improving with albuterol, Atrovent neb in office was still suspect some asthmatic bronchitis/flare. Start prednisone, doxycycline, potential side effects of both discussed. Especially with history of C. difficile, if any diarrhea, return right away.  -RTC precautions if not improving with treatment or worsening wheezing/dyspnea  Essential hypertension  Medication nonadherent recently. Restart Coreg. If cost prohibitive, can look at other options  Neck pain - Plan: carisoprodol (SOMA) 350 MG tablet, Ambulatory referral to Neurosurgery, DISCONTINUED: carisoprodol (SOMA) 350 MG tablet, CANCELED: Ambulatory referral to Neurosurgery Muscle spasms of neck - Plan: carisoprodol (SOMA) 350 MG tablet, Ambulatory referral to Neurosurgery, DISCONTINUED: carisoprodol (SOMA) 350 MG tablet, CANCELED: Ambulatory referral to Neurosurgery  -Persistent neck pain and spasms, cervical radiculopathy. Has been evaluated by neurology as well as orthopedics, would like second opinion from neurosurgeon. Botox injections unfortunately were cost prohibitive.  -Agreed to prescribe Soma temporarily, but will need to follow-up with orthopedic or neurosurgeon to develop plan.  -Denies any new headaches, fevers, or other infectious symptoms, but with prior history of meningitis if any worsening of symptoms, advised to proceed to emergency room for other  evaluation  Hyperglycemia - Plan: Hemoglobin A1c, POCT glucose (manual entry)  -  Check A1c with prior elevated glucose. Only mildly elevated in office reading, prednisone was prescribed as above  Meds ordered this encounter  Medications  . fluticasone (FLOVENT HFA) 44 MCG/ACT inhaler    Sig: Inhale 2 puffs 2 (two) times daily into the lungs.    Dispense:  1 Inhaler    Refill:  3  . albuterol (PROVENTIL HFA;VENTOLIN HFA) 108 (90 Base) MCG/ACT inhaler    Sig: Inhale 1-2 puffs every 4 (four) hours as needed into the lungs for wheezing or shortness of breath.    Dispense:  1 Inhaler    Refill:  0  . DISCONTD: carisoprodol (SOMA) 350 MG tablet    Sig: Take 1 tablet (350 mg total) 3 (three) times daily as needed by mouth. for muscle spams    Dispense:  30 tablet    Refill:  0  . albuterol (PROVENTIL) (2.5 MG/3ML) 0.083% nebulizer solution 2.5 mg  . ipratropium (ATROVENT) nebulizer solution 0.5 mg  . carisoprodol (SOMA) 350 MG tablet    Sig: Take 1 tablet (350 mg total) 3 (three) times daily as needed by mouth. for muscle spams    Dispense:  30 tablet    Refill:  0  . predniSONE (DELTASONE) 20 MG tablet    Sig: Take 2 tablets (40 mg total) daily with breakfast by mouth.    Dispense:  10 tablet    Refill:  0  . doxycycline (VIBRA-TABS) 100 MG tablet    Sig: Take 1 tablet (100 mg total) 2 (two) times daily by mouth.    Dispense:  20 tablet    Refill:  0   Patient Instructions   I wrote a short term refill of soma for now, but further treatment of your neck pain will need to be managed by your orthopaedist or neurosurgeon. I will refer you to [facility] for 2nd opinion, but with worsening symptoms, may need to proceed with MRI that had been ordered by Dr. Jaynee Eagles.   You can discuss obtaining that test with her office.  If any weakness, go to the emergency room. Additionally if you have new headaches, fever, or any other worsening symptoms, proceed to the emergency room for further  evaluation.  Follow up with gastroenterologist for abdominal pain. Return to the clinic or go to the nearest emergency room if any of your symptoms worsen or new symptoms occur. If diarrhea returns, return here or emergency room due to your previous history of C Diff.   Quitting smoking is very important for your medical problems as well as potential wound healing if you do need surgery. [place] offers smoking cessation clinics. Registration is required. To register call [phone] or register online at [email or web address]   start prednisone and doxycycline for current asthmatic bronchitis. Restart  Flovent inhaler 2 puffs twice per day, albuterol if needed. If you require albuterol more than once or twice per day, or persistently need albuterol in the next 3-4 days, return for recheck.  Return to the clinic or go to the nearest emergency room if any of your symptoms worsen or new symptoms occur.   Steps to Quit Smoking Smoking tobacco can be bad for your health. It can also affect almost every organ in your body. Smoking puts you and people around you at risk for many serious long-lasting (chronic) diseases. Quitting smoking is hard, but it is one of the best things that you can do for your health. It is never too late to quit. What are the benefits  of quitting smoking? When you quit smoking, you lower your risk for getting serious diseases and conditions. They can include:  Lung cancer or lung disease.  Heart disease.  Stroke.  Heart attack.  Not being able to have children (infertility).  Weak bones (osteoporosis) and broken bones (fractures).  If you have coughing, wheezing, and shortness of breath, those symptoms may get better when you quit. You may also get sick less often. If you are pregnant, quitting smoking can help to lower your chances of having a baby of low birth weight. What can I do to help me quit smoking? Talk with your doctor about what can help you quit smoking.  Some things you can do (strategies) include:  Quitting smoking totally, instead of slowly cutting back how much you smoke over a period of time.  Going to in-person counseling. You are more likely to quit if you go to many counseling sessions.  Using resources and support systems, such as: ? Database administrator with a Social worker. ? Phone quitlines. ? Careers information officer. ? Support groups or group counseling. ? Text messaging programs. ? Mobile phone apps or applications.  Taking medicines. Some of these medicines may have nicotine in them. If you are pregnant or breastfeeding, do not take any medicines to quit smoking unless your doctor says it is okay. Talk with your doctor about counseling or other things that can help you.  Talk with your doctor about using more than one strategy at the same time, such as taking medicines while you are also going to in-person counseling. This can help make quitting easier. What things can I do to make it easier to quit? Quitting smoking might feel very hard at first, but there is a lot that you can do to make it easier. Take these steps:  Talk to your family and friends. Ask them to support and encourage you.  Call phone quitlines, reach out to support groups, or work with a Social worker.  Ask people who smoke to not smoke around you.  Avoid places that make you want (trigger) to smoke, such as: ? Bars. ? Parties. ? Smoke-break areas at work.  Spend time with people who do not smoke.  Lower the stress in your life. Stress can make you want to smoke. Try these things to help your stress: ? Getting regular exercise. ? Deep-breathing exercises. ? Yoga. ? Meditating. ? Doing a body scan. To do this, close your eyes, focus on one area of your body at a time from head to toe, and notice which parts of your body are tense. Try to relax the muscles in those areas.  Download or buy apps on your mobile phone or tablet that can help you stick to your  quit plan. There are many free apps, such as QuitGuide from the [employer] Office manager for Disease Control and Prevention). You can find more support from smokefree.gov and other websites.  This information is not intended to replace advice given to you by your health care provider. Make sure you discuss any questions you have with your health care provider. Document Released: 12/22/2008 Document Revised: 10/24/2015 Document Reviewed: 07/12/2014 Elsevier Interactive Patient Education  2018 [employer].   IF you received an x-Cardenas today, you will receive an invoice from [facility]. Please contact [facility] at [phone] with questions or concerns regarding your invoice.   IF you received labwork today, you will receive an invoice from [facility]. Please contact [facility] at [phone] with questions or concerns regarding  your invoice.   Our billing staff will not be able to assist you with questions regarding bills from these companies.  You will be contacted with the lab results as soon as they are available. The fastest way to get your results is to activate your My Chart account. Instructions are located on the last page of this paperwork. If you have not heard from [place] regarding the results in 2 weeks, please contact this office.      I personally performed the services described in this documentation, which was scribed in my presence. The recorded information has been reviewed and considered for accuracy and completeness, addended by me as needed, and agree with information above.  Signed,   Marissa Ray, MD [facility] [place].  01/29/17 4:21 PM

## 2017-01-28 LAB — CBC
Hematocrit: 45 % (ref 34.0–46.6)
Hemoglobin: 15.5 g/dL (ref 11.1–15.9)
MCH: 31.1 pg (ref 26.6–33.0)
MCHC: 34.4 g/dL (ref 31.5–35.7)
MCV: 90 fL (ref 79–97)
Platelets: 445 10*3/uL — ABNORMAL HIGH (ref 150–379)
RBC: 4.99 x10E6/uL (ref 3.77–5.28)
RDW: 15.5 % — ABNORMAL HIGH (ref 12.3–15.4)
WBC: 11.9 10*3/uL — ABNORMAL HIGH (ref 3.4–10.8)

## 2017-01-28 LAB — HEMOGLOBIN A1C
Est. average glucose Bld gHb Est-mCnc: 128 mg/dL
Hgb A1c MFr Bld: 6.1 % — ABNORMAL HIGH (ref 4.8–5.6)

## 2017-01-29 ENCOUNTER — Encounter: Payer: Self-pay | Admitting: Family Medicine

## 2017-02-06 ENCOUNTER — Other Ambulatory Visit: Payer: Self-pay | Admitting: Family Medicine

## 2017-02-06 ENCOUNTER — Ambulatory Visit: Payer: Self-pay | Admitting: Hematology

## 2017-02-06 DIAGNOSIS — M542 Cervicalgia: Secondary | ICD-10-CM

## 2017-02-06 DIAGNOSIS — M62838 Other muscle spasm: Secondary | ICD-10-CM

## 2017-02-06 NOTE — Telephone Encounter (Signed)
Pt had office visit with Dr. Nyoka Cowden on 01/27/2017.  Patient C/O of loose watery stools with 6 a day.  Patient does C/O of some abdominal cramping with stools.  Per patient dr. Nyoka Cowden said antibiotic treatment my trigger colitis. / Triage disposition is for patient to be seen within 24 hours.  Patient and spouse refused to be seen as they do not have the finances for another office visit.  Patient states she just wants something called in for diarrhea and nausea.  Patient also refused to see any other provider at the practice. / I explained I could not guarantee that anything could be called in as an office visit may be required.   Home care instructions provided and patient was instructed when to seek emergency care.  Patient and spouse both verbalizes understanding / At the end of the call, patient asked to have Soma and Carvedilol refilled.  I will process the Carvedilol per protocol since LOV was 01-27-2017.  Please refill the Soma if possible. Reason for Disposition . [1] MODERATE diarrhea (e.g., 4-6 times / day more than normal) AND [2] age > 13 years  Additional Information . Negative: [1] SEVERE diarrhea (e.g., 7 or more times / day more than normal) AND [2] present > 24 hours (1 day)    Patient having 6 loose stools per day  Protocols used: DIARRHEA-A-AH

## 2017-02-06 NOTE — Telephone Encounter (Signed)
The system would not let me refill these 2 Rx due to the [facility].

## 2017-02-07 NOTE — Telephone Encounter (Signed)
Called into [place]

## 2017-02-07 NOTE — Telephone Encounter (Signed)
This was refilled, but I am not in office until Monday. Ok to wait until then if she is not out yet. Also discussed short term Rx until plan form neuro at last ov, so refilled at this time but need to know plan prior to any further refills (was referred to NS, but also followed by neuro with plan for MRI).

## 2017-02-07 NOTE — Telephone Encounter (Signed)
Reviewed chart. Called pt.  LMOVM for pt to be seen as suggested by Dr. Carlota Raspberry ASAP if she was having diarrhea as it may trigger C-diff.  Advised pt to go to [facility] on [facility] or ED since Dr. Carlota Raspberry is not in the office.  Advised Soma cannot be refilled as Dr. Carlota Raspberry advised her - he would only refill temporarily - Soma needs to be refilled by Neurologist or Neurosurgeon.

## 2017-02-09 DIAGNOSIS — K5900 Constipation, unspecified: Secondary | ICD-10-CM | POA: Diagnosis not present

## 2017-02-09 DIAGNOSIS — R112 Nausea with vomiting, unspecified: Secondary | ICD-10-CM | POA: Diagnosis not present

## 2017-02-09 DIAGNOSIS — E876 Hypokalemia: Secondary | ICD-10-CM | POA: Diagnosis not present

## 2017-02-09 DIAGNOSIS — N281 Cyst of kidney, acquired: Secondary | ICD-10-CM | POA: Diagnosis not present

## 2017-02-09 DIAGNOSIS — R404 Transient alteration of awareness: Secondary | ICD-10-CM | POA: Diagnosis not present

## 2017-02-09 DIAGNOSIS — R531 Weakness: Secondary | ICD-10-CM | POA: Diagnosis not present

## 2017-02-09 DIAGNOSIS — R062 Wheezing: Secondary | ICD-10-CM | POA: Diagnosis not present

## 2017-02-09 DIAGNOSIS — J449 Chronic obstructive pulmonary disease, unspecified: Secondary | ICD-10-CM | POA: Diagnosis not present

## 2017-02-20 ENCOUNTER — Other Ambulatory Visit: Payer: Self-pay | Admitting: Family Medicine

## 2017-02-20 DIAGNOSIS — M62838 Other muscle spasm: Secondary | ICD-10-CM

## 2017-02-20 DIAGNOSIS — M542 Cervicalgia: Secondary | ICD-10-CM

## 2017-02-20 NOTE — Telephone Encounter (Signed)
Please advise 

## 2017-02-24 NOTE — Telephone Encounter (Signed)
Soma refilled, but please check status of neurosurgery referral and plan (short term plan for Soma discussed at last visit). Thanks.

## 2017-02-25 ENCOUNTER — Encounter: Payer: Self-pay | Admitting: Internal Medicine

## 2017-02-25 ENCOUNTER — Ambulatory Visit (INDEPENDENT_AMBULATORY_CARE_PROVIDER_SITE_OTHER): Payer: Medicare Other | Admitting: Internal Medicine

## 2017-02-25 ENCOUNTER — Other Ambulatory Visit (INDEPENDENT_AMBULATORY_CARE_PROVIDER_SITE_OTHER): Payer: Medicare Other

## 2017-02-25 VITALS — BP 112/62 | HR 66 | Ht 64.0 in | Wt 113.0 lb

## 2017-02-25 DIAGNOSIS — R634 Abnormal weight loss: Secondary | ICD-10-CM

## 2017-02-25 DIAGNOSIS — I639 Cerebral infarction, unspecified: Secondary | ICD-10-CM

## 2017-02-25 DIAGNOSIS — R1011 Right upper quadrant pain: Secondary | ICD-10-CM

## 2017-02-25 DIAGNOSIS — R112 Nausea with vomiting, unspecified: Secondary | ICD-10-CM

## 2017-02-25 DIAGNOSIS — K862 Cyst of pancreas: Secondary | ICD-10-CM

## 2017-02-25 DIAGNOSIS — R1013 Epigastric pain: Secondary | ICD-10-CM | POA: Diagnosis not present

## 2017-02-25 LAB — CBC WITH DIFFERENTIAL/PLATELET
Basophils Absolute: 0 10*3/uL (ref 0.0–0.1)
Basophils Relative: 0.5 % (ref 0.0–3.0)
Eosinophils Absolute: 0.1 10*3/uL (ref 0.0–0.7)
Eosinophils Relative: 0.7 % (ref 0.0–5.0)
HCT: 43.1 % (ref 36.0–46.0)
Hemoglobin: 14.5 g/dL (ref 12.0–15.0)
Lymphocytes Relative: 36.8 % (ref 12.0–46.0)
Lymphs Abs: 3.8 10*3/uL (ref 0.7–4.0)
MCHC: 33.7 g/dL (ref 30.0–36.0)
MCV: 94.5 fl (ref 78.0–100.0)
Monocytes Absolute: 0.7 10*3/uL (ref 0.1–1.0)
Monocytes Relative: 6.8 % (ref 3.0–12.0)
Neutro Abs: 5.7 10*3/uL (ref 1.4–7.7)
Neutrophils Relative %: 55.2 % (ref 43.0–77.0)
Platelets: 384 10*3/uL (ref 150.0–400.0)
RBC: 4.57 Mil/uL (ref 3.87–5.11)
RDW: 16.1 % — ABNORMAL HIGH (ref 11.5–15.5)
WBC: 10.4 10*3/uL (ref 4.0–10.5)

## 2017-02-25 LAB — COMPREHENSIVE METABOLIC PANEL
ALT: 15 U/L (ref 0–35)
AST: 15 U/L (ref 0–37)
Albumin: 4.6 g/dL (ref 3.5–5.2)
Alkaline Phosphatase: 100 U/L (ref 39–117)
BUN: 24 mg/dL — ABNORMAL HIGH (ref 6–23)
CO2: 29 mEq/L (ref 19–32)
Calcium: 9.9 mg/dL (ref 8.4–10.5)
Chloride: 102 mEq/L (ref 96–112)
Creatinine, Ser: 0.99 mg/dL (ref 0.40–1.20)
GFR: 59.79 mL/min — ABNORMAL LOW (ref >60.00)
Glucose, Bld: 100 mg/dL — ABNORMAL HIGH (ref 70–99)
Potassium: 3.8 mEq/L (ref 3.5–5.1)
Sodium: 139 mEq/L (ref 135–145)
Total Bilirubin: 0.5 mg/dL (ref 0.2–1.2)
Total Protein: 7.3 g/dL (ref 6.0–8.3)

## 2017-02-25 LAB — LIPASE: Lipase: 33 U/L (ref 11.0–59.0)

## 2017-02-25 LAB — MAGNESIUM: Magnesium: 2.1 mg/dL (ref 1.5–2.5)

## 2017-02-25 LAB — TSH: TSH: 0.42 u[IU]/mL (ref 0.35–4.50)

## 2017-02-25 MED ORDER — SUPREP BOWEL PREP KIT 17.5-3.13-1.6 GM/177ML PO SOLN: 1.0000 | ORAL | 0 refills | Status: DC

## 2017-02-25 MED ORDER — ONDANSETRON HCL 4 MG PO TABS: 4.0000 mg | ORAL_TABLET | Freq: Four times a day (QID) | ORAL | 2 refills | Status: DC | PRN

## 2017-02-25 MED ORDER — METOCLOPRAMIDE HCL 10 MG PO TABS: ORAL_TABLET | ORAL | 0 refills | Status: DC

## 2017-02-25 NOTE — Progress Notes (Signed)
Subjective:    Patient ID: [name], female    DOB: 06-19-1950, 66 y.o.   MRN: [id number]  HPI [name] is a 66 year old female with a past medical history of GERD, history of C. difficile earlier in 2018, history of elevated liver enzymes, hypertension, history of CVA earlier this year, history of bacterial meningitis in February 2018 who is here for follow-up.  She was seen by Nicoletta Ba, PA-C on 11/13/2016 to evaluate right-sided abdominal pain, nausea and weight loss.  Upper endoscopy and colonoscopy were arranged with the patient did not come for these procedures.  She reports being too sick for them.  Today she is seen again and reports that she is getting "sicker and sicker".  She is not been able to gain weight and feels sick every time she eats.  She reports severe nausea and at times vomiting of nonbloody emesis.  She has epigastric and right upper quadrant pain on a daily basis, which is worse after eating.  This can radiate to her right lower quadrant.  She reports having lost 15-20 pounds overall and 2 pounds since being seen here.  She denies heartburn, dysphagia and odynophagia.  She was previously on pantoprazole but has been off of this lately due to financial constraints.  Since being off this medication she denies heartburn.  She reports bowel movements are regular occurring every day.  She denies blood in her stool or melena.  She denies constipation.  Occasional loose stools but no diarrhea.  She had hospitalization in May 2018 with GI bleed at [facility].  Hemoglobin decreased to as low as 5 g/dL.  She had an upper endoscopy there which was normal.  Colonoscopy was not performed at recommended as an outpatient.  She has had multiple imaging procedures this year, most recently in October 2018 she had a CT scan which showed no overt pathology but suggestive of constipation.  I performed an MRI/MRCP examination while she was hospitalized in July with elevated liver enzymes.   This showed multiple subcentimeter pancreatic cysts and it was recommended to have these reevaluated in 2 years.  There was mild intra-and extrahepatic biliary ductal dilatation upper limit of normal for postcholecystectomy state but no retained CBD stones.  Her liver enzymes have subsequently normalized.  She reports taking Tylenol on a regular basis and is currently using 1000 mg every 6 hours for pain.  She is status post cholecystectomy, hysterectomy, appendectomy and oophorectomy.   Review of Systems  As per HPI, otherwise negative  Current Medications, Allergies, Past Medical History, Past Surgical History, Family History and Social History were reviewed in [employer] record.      Objective:   Physical Exam BP 112/62   Pulse 66   Ht 5\' 4"  (1.626 m)   Wt 113 lb (51.3 kg)   BMI 19.40 kg/m  Constitutional: Well-developed, thin female. No distress. HEENT: Normocephalic and atraumatic. Oropharynx is clear and moist. Conjunctivae are normal.  No scleral icterus. Neck: Neck supple. Trachea midline. Cardiovascular: Normal rate, regular rhythm and intact distal pulses. Pulmonary/chest: Effort normal and breath sounds normal. No wheezing, rales or rhonchi. Abdominal: Soft, nontender, nondistended. Bowel sounds active throughout. There are no masses palpable. No hepatosplenomegaly. Extremities: no clubbing, cyanosis, or edema Neurological: Alert and oriented to person place and time. Skin: Skin is warm and dry. Psychiatric: Normal mood and affect. Behavior is normal.  CBC    Component Value Date/Time   WBC 10.4 02/25/2017 1018   RBC 4.57  02/25/2017 1018   HGB 14.5 02/25/2017 1018   HGB 15.5 01/27/2017 1230   HCT 43.1 02/25/2017 1018   HCT 45.0 01/27/2017 1230   PLT 384.0 02/25/2017 1018   PLT 445 (H) 01/27/2017 1230   MCV 94.5 02/25/2017 1018   MCV 90 01/27/2017 1230   MCH 31.1 01/27/2017 1230   MCH 30.5 01/10/2017 1154   MCHC 33.7 02/25/2017 1018    RDW 16.1 (H) 02/25/2017 1018   RDW 15.5 (H) 01/27/2017 1230   LYMPHSABS 3.8 02/25/2017 1018   MONOABS 0.7 02/25/2017 1018   EOSABS 0.1 02/25/2017 1018   BASOSABS 0.0 02/25/2017 1018   CMP     Component Value Date/Time   NA 139 02/25/2017 1018   NA 144 10/14/2016 1030   K 3.8 02/25/2017 1018   CL 102 02/25/2017 1018   CO2 29 02/25/2017 1018   GLUCOSE 100 (H) 02/25/2017 1018   BUN 24 (H) 02/25/2017 1018   BUN 13 10/14/2016 1030   CREATININE 0.99 02/25/2017 1018   CALCIUM 9.9 02/25/2017 1018   PROT 7.3 02/25/2017 1018   PROT 6.6 10/14/2016 1030   ALBUMIN 4.6 02/25/2017 1018   ALBUMIN 4.4 10/14/2016 1030   AST 15 02/25/2017 1018   ALT 15 02/25/2017 1018   ALKPHOS 100 02/25/2017 1018   BILITOT 0.5 02/25/2017 1018   BILITOT 0.2 10/14/2016 1030   GFRNONAA >60 01/10/2017 1154   GFRAA >60 01/10/2017 1154  MRI ABDOMEN WITHOUT AND WITH CONTRAST (INCLUDING MRCP)   TECHNIQUE: Multiplanar multisequence MR imaging of the abdomen was performed both before and after the administration of intravenous contrast. Heavily T2-weighted images of the biliary and pancreatic ducts were obtained, and three-dimensional MRCP images were rendered by post processing.   CONTRAST:  52mL MULTIHANCE GADOBENATE DIMEGLUMINE 529 MG/ML IV SOLN   COMPARISON:  CT of 1 day prior   FINDINGS: Portions of the dedicated MRCP series are motion degraded.   Lower chest: Mild cardiomegaly, without pericardial or pleural effusion.   Hepatobiliary: No suspicious liver lesion. Status post cholecystectomy. Intrahepatic ducts are upper normal after cholecystectomy. The common duct is also upper normal, including at 10 mm on image 21/ series 5, image 44/series 8. Given mild limitations of MRCP series, no evidence of choledocholithiasis.   Pancreas: No pancreatic duct dilatation or acute pancreatitis. Multiple simple appearing cystic foci are identified within the pancreas, including lesions within the body, head,  and uncinate process. The largest is within the pancreatic head at 7 mm on image 23/series 5. No post-contrast enhancement.   Spleen:  Normal in size, without focal abnormality.   Adrenals/Urinary Tract: Normal adrenal glands. In both kidneys there are precontrast T1 hyperintense lesions. The largest is within the upper pole left kidney at 11 mm on image 25/series 1500. None of these lesions demonstrate postcontrast enhancement, including on subtracted images. There are also too small to characterize renal lesions. No hydronephrosis.   Stomach/Bowel: Proximal gastric underdistention. Apparent wall thickening could be secondary. Normal large and small bowel loops.   Vascular/Lymphatic: Aortic atherosclerosis. No retroperitoneal or retrocrural adenopathy.   Other:  No ascites.   Musculoskeletal: No acute osseous abnormality.   IMPRESSION: 1. Cholecystectomy with upper normal intra and extrahepatic biliary ducts. No cause identified. 2. Subcentimeter cystic lesions within the pancreas. Most likely pseudocysts. Indolent cystic neoplasms could look similar. Per consensus criteria, these warrant followup with pre and post contrast abdominal MRI/MRCP at 2 years. This recommendation follows ACR consensus guidelines: Management of Incidental Pancreatic Cysts: A White Paper  of the ACR Incidental Findings Committee. J Am Coll Radiol 0349;17:915-056. 3. Bilateral renal cysts and complex cysts. No enhancing renal lesion.     Electronically Signed   By: Abigail Miyamoto M.D.   On: 09/09/2016 16:56  CT ABDOMEN AND PELVIS WITH CONTRAST   TECHNIQUE: Multidetector CT imaging of the abdomen and pelvis was performed using the standard protocol following bolus administration of intravenous contrast.   CONTRAST:  166mL ISOVUE-300 IOPAMIDOL (ISOVUE-300) INJECTION 61%   COMPARISON:  10/20/2016 and 01/22/2007   FINDINGS: Lower Chest: No acute findings.   Hepatobiliary: No hepatic masses  identified. Prior cholecystectomy. Stable mild diffuse biliary ductal dilatation.   Pancreas: Stable 9 mm cyst in the pancreatic head dating back to 2008, consistent with benign etiology. One or 2 other tiny less than 5 mm cysts also seen in the pancreatic body. No solid pancreatic masses or pancreatic ductal dilatation. No evidence of peripancreatic inflammatory changes or fluid collections.   Spleen: Within normal limits in size and appearance.   Adrenals/Urinary Tract: No masses identified. Small bilateral renal cysts again noted. No evidence of hydronephrosis. Unremarkable unopacified urinary bladder.   Stomach/Bowel: No evidence of obstruction, inflammatory process or abnormal fluid collections. Large colonic stool burden noted.   Vascular/Lymphatic: No pathologically enlarged lymph nodes. No abdominal aortic aneurysm. Aortic atherosclerosis.   Reproductive: Prior hysterectomy noted. Adnexal regions are unremarkable in appearance.   Other:  None.   Musculoskeletal:  No suspicious bone lesions identified.   IMPRESSION: No acute findings.   Large stool burden noted; recommend clinical correlation for possible constipation.     Electronically Signed   By: Earle Gell M.D.   On: 12/28/2016 16:53      Assessment & Plan:  66 year old female with a past medical history of GERD, history of C. difficile earlier in 2018, history of elevated liver enzymes, hypertension, history of CVA earlier this year, history of bacterial meningitis in February 2018 who is here for follow-up.    1. N/V/ upper and right sided abd pain --I have recommended repeat labs today to include CBC, CMP, lipase, magnesium and thyroid studies.  Of note her TSH was noted to be low raising question of hyperthyroidism earlier this year but her T3 and T4 were normal.  Repeat TSH and thyroid studies was recommended but not performed. --I recommended upper endoscopy and colonoscopy to further evaluate her  symptoms.  We discussed the risk, benefits and alternatives and she is agreeable to proceed.  We will prescribe Reglan 10 mg p.o. 30 minutes to an hour before each half of her bowel preparation --Begin Zofran 4 mg every 6 hours as needed nausea  2.  History of elevated LFTs --liver enzymes have normalized and there is no evidence of obstructive biliary disease.  3.  Pancreatic cysts --will need repeat imaging, no longer than July 2020 however if upper endoscopy and colonoscopy are normal and pain continues I would recommend repeating MRI abdomen with MRCP sooner.  25 minutes spent with the patient today. Greater than 50% was spent in counseling and coordination of care with the patient

## 2017-02-25 NOTE — Patient Instructions (Signed)
You have been scheduled for an endoscopy and colonoscopy. Please follow the written instructions given to you at your visit today. Please pick up your prep supplies at the pharmacy within the next 1-3 days. If you use inhalers (even only as needed), please bring them with you on the day of your procedure. Your physician has requested that you go to www.startemmi.com and enter the access code given to you at your visit today. This web site gives a general overview about your procedure. However, you should still follow specific instructions given to you by our office regarding your preparation for the procedure.  We have sent the following medications to your pharmacy for you to pick up at your convenience: Zofran 4 mg every 6 hours as needed for nausea   Your physician has requested that you go to the basement for the following lab work before leaving today: CBC, CMP, Lipase, Magnesium, TSH, T3, T4  If you are age 32 or older, your body mass index should be between 23-30. Your Body mass index is 19.4 kg/m. If this is out of the aforementioned range listed, please consider follow up with your Primary Care Provider.  If you are age 18 or younger, your body mass index should be between 19-25. Your Body mass index is 19.4 kg/m. If this is out of the aformentioned range listed, please consider follow up with your Primary Care Provider.

## 2017-02-26 LAB — T4: T4, Total: 9.3 ug/dL (ref 5.1–11.9)

## 2017-02-26 LAB — T3: T3, Total: 102 ng/dL (ref 76–181)

## 2017-02-27 ENCOUNTER — Telehealth: Payer: Self-pay | Admitting: Family Medicine

## 2017-02-27 NOTE — Telephone Encounter (Signed)
Spoke to New pt Coordinator @ [facility].  Denny Peon states they have an increased amount of referrals due to end of year.  She will work on this pt's referral ASAP.  Spoke to pt and advised she will be hearing from [facility].  Pt appreciative, states she in a lot of pain.

## 2017-02-27 NOTE — Telephone Encounter (Signed)
Copied from [place] [id number]. Topic: Quick Communication - See Telephone Encounter >> Feb 27, 2017  9:06 AM Ahmed Prima L wrote: CRM for notification. See Telephone encounter for:   02/27/17.   Pt is requesting that the nurse call her back regarding the pain clinic, pt said she isnt getting no better and would like to move forward with that. Please call back at [phone]

## 2017-02-28 ENCOUNTER — Telehealth: Payer: Self-pay | Admitting: Internal Medicine

## 2017-02-28 MED ORDER — TRAMADOL HCL 50 MG PO TABS: 50.0000 mg | ORAL_TABLET | Freq: Three times a day (TID) | ORAL | 0 refills | Status: DC | PRN

## 2017-02-28 NOTE — Telephone Encounter (Signed)
Patient aware, script sent to pharmacy. 

## 2017-02-28 NOTE — Telephone Encounter (Signed)
Pt states she is having pain up and down her right side. States the pain is not relieved by some ultram that she had on hand. Pt wants something different for pain. Please advise.

## 2017-02-28 NOTE — Telephone Encounter (Signed)
Tramadol is all that I could provide until she comes for her upper endoscopy and colonoscopy 50-100 mg every 8 hr PRN, #30 No refills I cannot prescribe another narcotic and if she feels this is necessary she needs to call her primary doctor

## 2017-03-06 ENCOUNTER — Other Ambulatory Visit: Payer: Self-pay | Admitting: Family Medicine

## 2017-03-06 DIAGNOSIS — M542 Cervicalgia: Secondary | ICD-10-CM

## 2017-03-06 DIAGNOSIS — M62838 Other muscle spasm: Secondary | ICD-10-CM

## 2017-03-09 NOTE — Telephone Encounter (Signed)
Reviewed recent notes. Will refill for #30, but will need update on specialist treatment prior to any further refills.

## 2017-03-19 ENCOUNTER — Other Ambulatory Visit: Payer: Self-pay | Admitting: Family Medicine

## 2017-03-19 DIAGNOSIS — M542 Cervicalgia: Secondary | ICD-10-CM

## 2017-03-19 DIAGNOSIS — M62838 Other muscle spasm: Secondary | ICD-10-CM

## 2017-03-19 NOTE — Telephone Encounter (Signed)
Noted. Prescription refilled for [facility] and sent electronically to pharmacy. Follow-up with neurosurgery as planned.

## 2017-03-19 NOTE — Telephone Encounter (Signed)
Req for refill of carisoprodol ([facility]).  Spoke with [facility] - pt has appointment with Dr. Trenton Gammon Mar 27 2017@10am .  Note sent to Dr. Carlota Raspberry

## 2017-03-26 ENCOUNTER — Encounter: Payer: Self-pay | Admitting: Internal Medicine

## 2017-03-26 ENCOUNTER — Ambulatory Visit (AMBULATORY_SURGERY_CENTER): Payer: Medicare Other | Admitting: Internal Medicine

## 2017-03-26 ENCOUNTER — Other Ambulatory Visit: Payer: Self-pay

## 2017-03-26 VITALS — BP 144/72 | HR 84 | Temp 98.7°F | Resp 13 | Ht 64.0 in | Wt 113.0 lb

## 2017-03-26 DIAGNOSIS — R634 Abnormal weight loss: Secondary | ICD-10-CM | POA: Diagnosis not present

## 2017-03-26 DIAGNOSIS — D128 Benign neoplasm of rectum: Secondary | ICD-10-CM

## 2017-03-26 DIAGNOSIS — Z1211 Encounter for screening for malignant neoplasm of colon: Secondary | ICD-10-CM

## 2017-03-26 DIAGNOSIS — K635 Polyp of colon: Secondary | ICD-10-CM | POA: Diagnosis not present

## 2017-03-26 DIAGNOSIS — K2270 Barrett's esophagus without dysplasia: Secondary | ICD-10-CM | POA: Diagnosis not present

## 2017-03-26 DIAGNOSIS — Z1212 Encounter for screening for malignant neoplasm of rectum: Secondary | ICD-10-CM

## 2017-03-26 DIAGNOSIS — D125 Benign neoplasm of sigmoid colon: Secondary | ICD-10-CM

## 2017-03-26 DIAGNOSIS — R1013 Epigastric pain: Secondary | ICD-10-CM | POA: Diagnosis not present

## 2017-03-26 DIAGNOSIS — D129 Benign neoplasm of anus and anal canal: Secondary | ICD-10-CM

## 2017-03-26 DIAGNOSIS — D123 Benign neoplasm of transverse colon: Secondary | ICD-10-CM | POA: Diagnosis not present

## 2017-03-26 DIAGNOSIS — R1011 Right upper quadrant pain: Secondary | ICD-10-CM | POA: Diagnosis not present

## 2017-03-26 DIAGNOSIS — R112 Nausea with vomiting, unspecified: Secondary | ICD-10-CM | POA: Diagnosis not present

## 2017-03-26 DIAGNOSIS — G43A0 Cyclical vomiting, not intractable: Secondary | ICD-10-CM | POA: Diagnosis not present

## 2017-03-26 DIAGNOSIS — K319 Disease of stomach and duodenum, unspecified: Secondary | ICD-10-CM | POA: Diagnosis not present

## 2017-03-26 MED ORDER — FLUCONAZOLE 100 MG PO TABS: 100.0000 mg | ORAL_TABLET | Freq: Every day | ORAL | 0 refills | Status: DC

## 2017-03-26 MED ORDER — SODIUM CHLORIDE 0.9 % IV SOLN: 500.0000 mL | Freq: Once | INTRAVENOUS | Status: DC

## 2017-03-26 NOTE — Progress Notes (Signed)
Called to room to assist during endoscopic procedure.  Patient ID and intended procedure confirmed with present staff. Received instructions for my participation in the procedure from the performing physician.  

## 2017-03-26 NOTE — Progress Notes (Signed)
A and O x3. Report to RN. Tolerated MAC anesthesia well.Teethand gums  unchanged after procedure. 

## 2017-03-26 NOTE — Patient Instructions (Addendum)
YOU HAD AN ENDOSCOPIC PROCEDURE TODAY AT [facility]:   Refer to the procedure report that was given to you for any specific questions about what was found during the examination.  If the procedure report does not answer your questions, please call your gastroenterologist to clarify.  If you requested that your care partner not be given the details of your procedure findings, then the procedure report has been included in a sealed envelope for you to review at your convenience later.  YOU SHOULD EXPECT: Some feelings of bloating in the abdomen. Passage of more gas than usual.  Walking can help get rid of the air that was put into your GI tract during the procedure and reduce the bloating. If you had a lower endoscopy (such as a colonoscopy or flexible sigmoidoscopy) you may notice spotting of blood in your stool or on the toilet paper. If you underwent a bowel prep for your procedure, you may not have a normal bowel movement for a few days.  Please Note:  You might notice some irritation and congestion in your nose or some drainage.  This is from the oxygen used during your procedure.  There is no need for concern and it should clear up in a day or so.  SYMPTOMS TO REPORT IMMEDIATELY:   Following lower endoscopy (colonoscopy or flexible sigmoidoscopy):  Excessive amounts of blood in the stool  Significant tenderness or worsening of abdominal pains  Swelling of the abdomen that is new, acute  Fever of 100F or higher   Following upper endoscopy (EGD)  Vomiting of blood or coffee ground material  New chest pain or pain under the shoulder blades  Painful or persistently difficult swallowing  New shortness of breath  Fever of 100F or higher  Black, tarry-looking stools  For urgent or emergent issues, a gastroenterologist can be reached at any hour by calling [phone].   DIET:  We do recommend a small meal at first, but then you may proceed to your regular diet.  Drink  plenty of fluids but you should avoid alcoholic beverages for 24 hours.  ACTIVITY:  You should plan to take it easy for the rest of today and you should NOT DRIVE or use heavy machinery until tomorrow (because of the sedation medicines used during the test).    FOLLOW UP: Our staff will call the number listed on your records the next business day following your procedure to check on you and address any questions or concerns that you may have regarding the information given to you following your procedure. If we do not reach you, we will leave a message.  However, if you are feeling well and you are not experiencing any problems, there is no need to return our call.  We will assume that you have returned to your regular daily activities without incident.  If any biopsies were taken you will be contacted by phone or by letter within the next 1-3 weeks.  Please call us at [phone] if you have not heard about the biopsies in 3 weeks.    SIGNATURES/CONFIDENTIALITY: You and/or your care partner have signed paperwork which will be entered into your electronic medical record.  These signatures attest to the fact that that the information above on your After Visit Summary has been reviewed and is understood.  Full responsibility of the confidentiality of this discharge information lies with you and/or your care-partner.  Polyp, diverticulosis, and hemorrhoid information given.  Begin Fluconazole as ordered.  No non steroidal anti-inflammatory meds for 2 weeks.

## 2017-03-26 NOTE — Op Note (Signed)
[facility] Patient Name: [name] Procedure Date: 03/26/2017 2:09 PM MRN: [id number] Endoscopist: Jerene Bears , MD Age: 67 Referring MD:  Date of Birth: 24-Feb-1951 Gender: Female Account #: [id number] Procedure:                Colonoscopy Indications:              Screening for colorectal malignant neoplasm, This                            is the patient's first colonoscopy, Incidental                            abdominal pain noted Medicines:                Monitored Anesthesia Care Procedure:                Pre-Anesthesia Assessment:                           - Prior to the procedure, a History and Physical                            was performed, and patient medications and                            allergies were reviewed. The patient's tolerance of                            previous anesthesia was also reviewed. The risks                            and benefits of the procedure and the sedation                            options and risks were discussed with the patient.                            All questions were answered, and informed consent                            was obtained. Prior Anticoagulants: The patient has                            taken no previous anticoagulant or antiplatelet                            agents. ASA Grade Assessment: III - A patient with                            severe systemic disease. After reviewing the risks                            and benefits, the patient was deemed in  satisfactory condition to undergo the procedure.                           After obtaining informed consent, the colonoscope                            was passed under direct vision. Throughout the                            procedure, the patient's blood pressure, pulse, and                            oxygen saturations were monitored continuously. The                            Colonoscope was introduced through the  anus and                            advanced to the the cecum, identified by                            appendiceal orifice and ileocecal valve. The                            colonoscopy was performed without difficulty. The                            patient tolerated the procedure well. The quality                            of the bowel preparation was good. The ileocecal                            valve, appendiceal orifice, and rectum were                            photographed. Scope In: 2:27:51 PM Scope Out: 2:48:21 PM Scope Withdrawal Time: 0 hours 15 minutes 47 seconds  Total Procedure Duration: 0 hours 20 minutes 30 seconds  Findings:                 The perianal exam findings include skin tags.                           A 7 mm polyp was found in the hepatic flexure. The                            polyp was flat. The polyp was removed with a cold                            snare. Resection and retrieval were complete.                           A 4 mm polyp was found in the sigmoid colon. The  polyp was sessile. The polyp was removed with a                            cold snare. Resection and retrieval were complete.                           A 10 mm polyp was found in the rectum. The polyp                            was sessile. The polyp was removed with a hot                            snare. Resection and retrieval were complete.                           Scattered small-mouthed diverticula were found in                            the sigmoid colon.                           External and internal hemorrhoids were found during                            digital exam. The hemorrhoids were small. Complications:            No immediate complications. Estimated Blood Loss:     Estimated blood loss was minimal. Impression:               - One 7 mm polyp at the hepatic flexure, removed                            with a cold snare. Resected and  retrieved.                           - One 4 mm polyp in the sigmoid colon, removed with                            a cold snare. Resected and retrieved.                           - One 10 mm polyp in the rectum, removed with a hot                            snare. Resected and retrieved.                           - Mild diverticulosis in the sigmoid colon.                           - External and internal hemorrhoids. Recommendation:           - Patient has a contact number available for  emergencies. The signs and symptoms of potential                            delayed complications were discussed with the                            patient. Return to normal activities tomorrow.                            Written discharge instructions were provided to the                            patient.                           - Resume previous diet.                           - Continue present medications.                           - No NSAIDs for at least 2 weeks.                           - Await pathology results.                           - Repeat colonoscopy is recommended. The                            colonoscopy date will be determined after pathology                            results from today's exam become available for                            review. Jerene Bears, MD 03/26/2017 3:03:46 PM This report has been signed electronically.

## 2017-03-26 NOTE — Op Note (Signed)
[place] Patient Name: Rashon Rezek Procedure Date: 03/26/2017 2:10 PM MRN: [id number] Endoscopist: Jerene Bears , MD Age: 66 Referring MD:  Date of Birth: 04/27/1950 Gender: Female Account #: [id number] Procedure:                Upper GI endoscopy Indications:              Abdominal pain in the right upper quadrant, Nausea                            with vomiting Medicines:                Monitored Anesthesia Care Procedure:                Pre-Anesthesia Assessment:                           - Prior to the procedure, a History and Physical                            was performed, and patient medications and                            allergies were reviewed. The patient's tolerance of                            previous anesthesia was also reviewed. The risks                            and benefits of the procedure and the sedation                            options and risks were discussed with the patient.                            All questions were answered, and informed consent                            was obtained. Prior Anticoagulants: The patient has                            taken no previous anticoagulant or antiplatelet                            agents. ASA Grade Assessment: III - A patient with                            severe systemic disease. After reviewing the risks                            and benefits, the patient was deemed in                            satisfactory condition to undergo the procedure.  After obtaining informed consent, the endoscope was                            passed under direct vision. Throughout the                            procedure, the patient's blood pressure, pulse, and                            oxygen saturations were monitored continuously. The                            Model GIF-HQ190 [id number]) scope was introduced                            through the mouth, and advanced to the  second part                            of duodenum. The upper GI endoscopy was                            accomplished without difficulty. The patient                            tolerated the procedure well. Scope In: Scope Out: Findings:                 Patchy candidiasis was found in the entire                            esophagus.                           The Z-line was irregular and was found 35 cm from                            the incisors. Biopsies were taken with a cold                            forceps for histology.                           Localized mild inflammation characterized by                            erosions and erythema was found in the gastric                            antrum. Biopsies were taken with a cold forceps for                            histology.                           The cardia and gastric fundus were normal on  retroflexion.                           The examined duodenum was normal. Complications:            No immediate complications. Estimated Blood Loss:     Estimated blood loss was minimal. Impression:               - Monilial esophagitis.                           - Z-line irregular, 35 cm from the incisors.                            Biopsied.                           - Gastritis. Biopsied.                           - Normal examined duodenum. Recommendation:           - Patient has a contact number available for                            emergencies. The signs and symptoms of potential                            delayed complications were discussed with the                            patient. Return to normal activities tomorrow.                            Written discharge instructions were provided to the                            patient.                           - Resume previous diet.                           - Continue present medications.                           - Begin fluconazole 200 mg x  1 day, 100 mg x 13                            days for yeast esophagitis.                           - Await pathology results.                           - See the other procedure note for documentation of  additional recommendations. Jerene Bears, MD 03/26/2017 2:59:33 PM This report has been signed electronically.

## 2017-03-26 NOTE — Progress Notes (Signed)
Pt's states no medical or surgical changes since previsit or office visit. 

## 2017-03-27 ENCOUNTER — Telehealth: Payer: Self-pay | Admitting: *Deleted

## 2017-03-27 DIAGNOSIS — M5412 Radiculopathy, cervical region: Secondary | ICD-10-CM | POA: Diagnosis not present

## 2017-03-27 DIAGNOSIS — I10 Essential (primary) hypertension: Secondary | ICD-10-CM | POA: Diagnosis not present

## 2017-03-27 DIAGNOSIS — Z681 Body mass index (BMI) 19 or less, adult: Secondary | ICD-10-CM | POA: Diagnosis not present

## 2017-03-27 NOTE — Telephone Encounter (Signed)
  Follow up Call-  Call back number 03/26/2017  Post procedure Call Back phone  # [phone]  Permission to leave phone message Yes  Some recent data might be hidden     Patient questions:  Do you have a fever, pain , or abdominal swelling? No. Pain Score  0 *  Have you tolerated food without any problems? Yes.    Have you been able to return to your normal activities? Yes.    Do you have any questions about your discharge instructions: Diet   No. Medications  No. Follow up visit  No.  Do you have questions or concerns about your Care? No.  Actions: * If pain score is 4 or above: No action needed, pain <4.

## 2017-03-31 ENCOUNTER — Encounter: Payer: Self-pay | Admitting: Internal Medicine

## 2017-04-01 ENCOUNTER — Other Ambulatory Visit: Payer: Self-pay

## 2017-04-01 MED ORDER — PANTOPRAZOLE SODIUM 40 MG PO TBEC: 40.0000 mg | DELAYED_RELEASE_TABLET | Freq: Every day | ORAL | 3 refills | Status: DC

## 2017-04-07 ENCOUNTER — Other Ambulatory Visit: Payer: Self-pay | Admitting: Family Medicine

## 2017-04-07 DIAGNOSIS — M5412 Radiculopathy, cervical region: Secondary | ICD-10-CM | POA: Diagnosis not present

## 2017-04-08 ENCOUNTER — Other Ambulatory Visit: Payer: Self-pay | Admitting: Neurosurgery

## 2017-04-08 DIAGNOSIS — R030 Elevated blood-pressure reading, without diagnosis of hypertension: Secondary | ICD-10-CM | POA: Diagnosis not present

## 2017-04-08 DIAGNOSIS — G08 Intracranial and intraspinal phlebitis and thrombophlebitis: Secondary | ICD-10-CM | POA: Diagnosis not present

## 2017-04-08 DIAGNOSIS — M5412 Radiculopathy, cervical region: Secondary | ICD-10-CM | POA: Diagnosis not present

## 2017-04-08 DIAGNOSIS — M4802 Spinal stenosis, cervical region: Secondary | ICD-10-CM | POA: Diagnosis not present

## 2017-04-08 DIAGNOSIS — R531 Weakness: Secondary | ICD-10-CM | POA: Diagnosis not present

## 2017-04-16 ENCOUNTER — Telehealth: Payer: Self-pay | Admitting: Internal Medicine

## 2017-04-16 MED ORDER — DICYCLOMINE HCL 10 MG PO CAPS: 10.0000 mg | ORAL_CAPSULE | Freq: Three times a day (TID) | ORAL | 0 refills | Status: DC

## 2017-04-16 NOTE — Telephone Encounter (Signed)
Pt reports she is having spasms in her RLQ. States she has taken bentyl in the past for these and would like a prescription sent in for her. Dr. Hilarie Fredrickson please advise.

## 2017-04-16 NOTE — Telephone Encounter (Signed)
OK to send in refill for bentyl per Dr. Hilarie Fredrickson. Script sent to pharmacy.

## 2017-04-21 NOTE — Pre-Procedure Instructions (Signed)
[name]  04/21/2017      [facility] - [address], [address] [place] [address] Phone: [phone] Fax: [phone]    Your procedure is scheduled on February 15  Report to [facility] Admitting at 0930 A.M.  Call this number if you have problems the morning of surgery:  [phone]   Remember:  Do not eat food or drink liquids after midnight.  Continue all medications as directed by your physician except follow these medication instructions before surgery below   Take these medicines the morning of surgery with A SIP OF WATER   acetaminophen (TYLENOL) carvedilol (COREG)  dicyclomine (BENTYL) ondansetron (ZOFRAN) pantoprazole (PROTONIX)  7 days prior to surgery STOP taking any Aspirin(unless otherwise instructed by your surgeon), Aleve, Naproxen, Ibuprofen, Motrin, Advil, Goody's, BC's, all herbal medications, fish oil, and all vitamins    Do not wear jewelry, make-up or nail polish.  Do not wear lotions, powders, or perfumes, or deodorant.  Do not shave 48 hours prior to surgery.    Do not bring valuables to the hospital.  [facility] is not responsible for any belongings or valuables.  Contacts, dentures or bridgework may not be worn into surgery.  Leave your suitcase in the car.  After surgery it may be brought to your room.  For patients admitted to the hospital, discharge time will be determined by your treatment team.  Patients discharged the day of surgery will not be allowed to drive home.    Special instructions:   [place]- Preparing For Surgery  Before surgery, you can play an important role. Because skin is not sterile, your skin needs to be as free of germs as possible. You can reduce the number of germs on your skin by washing with CHG (chlorahexidine gluconate) Soap before surgery.  CHG is an antiseptic cleaner which kills germs and bonds with the  skin to continue killing germs even after washing.  Please do not use if you have an allergy to CHG or antibacterial soaps. If your skin becomes reddened/irritated stop using the CHG.  Do not shave (including legs and underarms) for at least 48 hours prior to first CHG shower. It is OK to shave your face.  Please follow these instructions carefully.   1. Shower the NIGHT BEFORE SURGERY and the MORNING OF SURGERY with CHG.   2. If you chose to wash your hair, wash your hair first as usual with your normal shampoo.  3. After you shampoo, rinse your hair and body thoroughly to remove the shampoo.  4. Use CHG as you would any other liquid soap. You can apply CHG directly to the skin and wash gently with a scrungie or a clean washcloth.   5. Apply the CHG Soap to your body ONLY FROM THE NECK DOWN.  Do not use on open wounds or open sores. Avoid contact with your eyes, ears, mouth and genitals (private parts). Wash Face and genitals (private parts)  with your normal soap.  6. Wash thoroughly, paying special attention to the area where your surgery will be performed.  7. Thoroughly rinse your body with warm water from the neck down.  8. DO NOT shower/wash with your normal soap after using and rinsing off the CHG Soap.  9. Pat yourself dry with a CLEAN TOWEL.  10. Wear CLEAN PAJAMAS to bed the night before surgery, wear comfortable clothes  the morning of surgery  11. Place CLEAN SHEETS on your bed the night of your first shower and DO NOT SLEEP WITH PETS.    Day of Surgery: Do not apply any deodorants/lotions. Please wear clean clothes to the hospital/surgery center.      Please read over the following fact sheets that you were given.

## 2017-04-22 ENCOUNTER — Inpatient Hospital Stay (HOSPITAL_COMMUNITY)
Admission: RE | Admit: 2017-04-22 | Discharge: 2017-04-22 | Disposition: A | Discharge status: Home / Self Care | Payer: Medicare Other | Source: Ambulatory Visit

## 2017-04-25 ENCOUNTER — Inpatient Hospital Stay (HOSPITAL_COMMUNITY): Admission: RE | Admit: 2017-04-25 | Payer: Medicare Other | Source: Ambulatory Visit | Admitting: Neurosurgery

## 2017-04-25 ENCOUNTER — Encounter (HOSPITAL_COMMUNITY): Admission: RE | Payer: Self-pay | Source: Ambulatory Visit

## 2017-04-25 SURGERY — ANTERIOR CERVICAL DECOMPRESSION/DISCECTOMY FUSION 2 LEVELS
Anesthesia: General

## 2017-05-07 ENCOUNTER — Other Ambulatory Visit: Payer: Self-pay | Admitting: Family Medicine

## 2017-05-22 ENCOUNTER — Other Ambulatory Visit: Payer: Self-pay | Admitting: Internal Medicine

## 2017-05-27 ENCOUNTER — Telehealth: Payer: Self-pay | Admitting: Internal Medicine

## 2017-05-27 NOTE — Telephone Encounter (Signed)
Pt was given diflucan for yeast in her esophagus in January, pt states she thinks the yeast may be back. Pt states she is having pain in her abdomen that is unbearable and she is short of breath. Discussed with pt that if the pain is that bad and she is SOB she needs to go to the ER. Pt verbalized understanding.

## 2017-06-03 ENCOUNTER — Other Ambulatory Visit: Payer: Self-pay | Admitting: Family Medicine

## 2017-06-06 ENCOUNTER — Emergency Department (HOSPITAL_COMMUNITY): Payer: Medicare Other

## 2017-06-06 ENCOUNTER — Encounter (HOSPITAL_COMMUNITY): Payer: Self-pay | Admitting: *Deleted

## 2017-06-06 ENCOUNTER — Emergency Department (HOSPITAL_COMMUNITY)
Admission: EM | Admit: 2017-06-06 | Discharge: 2017-06-06 | Disposition: A | Discharge status: Home / Self Care | Payer: Medicare Other | Attending: Emergency Medicine | Admitting: Emergency Medicine

## 2017-06-06 DIAGNOSIS — Z79899 Other long term (current) drug therapy: Secondary | ICD-10-CM | POA: Insufficient documentation

## 2017-06-06 DIAGNOSIS — R197 Diarrhea, unspecified: Secondary | ICD-10-CM | POA: Insufficient documentation

## 2017-06-06 DIAGNOSIS — Z859 Personal history of malignant neoplasm, unspecified: Secondary | ICD-10-CM | POA: Diagnosis not present

## 2017-06-06 DIAGNOSIS — R109 Unspecified abdominal pain: Secondary | ICD-10-CM | POA: Insufficient documentation

## 2017-06-06 DIAGNOSIS — R0602 Shortness of breath: Secondary | ICD-10-CM | POA: Insufficient documentation

## 2017-06-06 DIAGNOSIS — I10 Essential (primary) hypertension: Secondary | ICD-10-CM | POA: Insufficient documentation

## 2017-06-06 DIAGNOSIS — F17210 Nicotine dependence, cigarettes, uncomplicated: Secondary | ICD-10-CM | POA: Diagnosis not present

## 2017-06-06 DIAGNOSIS — R0789 Other chest pain: Secondary | ICD-10-CM | POA: Diagnosis not present

## 2017-06-06 DIAGNOSIS — R112 Nausea with vomiting, unspecified: Secondary | ICD-10-CM | POA: Diagnosis not present

## 2017-06-06 DIAGNOSIS — K5900 Constipation, unspecified: Secondary | ICD-10-CM | POA: Insufficient documentation

## 2017-06-06 DIAGNOSIS — Z8673 Personal history of transient ischemic attack (TIA), and cerebral infarction without residual deficits: Secondary | ICD-10-CM | POA: Diagnosis not present

## 2017-06-06 LAB — HEPATIC FUNCTION PANEL
ALT: 14 U/L (ref 14–54)
AST: 17 U/L (ref 15–41)
Albumin: 4.7 g/dL (ref 3.5–5.0)
Alkaline Phosphatase: 91 U/L (ref 38–126)
Bilirubin, Direct: <0.1 mg/dL — ABNORMAL LOW (ref 0.1–0.5)
Total Bilirubin: 0.4 mg/dL (ref 0.3–1.2)
Total Protein: 7.9 g/dL (ref 6.5–8.1)

## 2017-06-06 LAB — BASIC METABOLIC PANEL
Anion gap: 12 (ref 5–15)
BUN: 23 mg/dL — ABNORMAL HIGH (ref 6–20)
CO2: 28 mmol/L (ref 22–32)
Calcium: 10.1 mg/dL (ref 8.9–10.3)
Chloride: 103 mmol/L (ref 101–111)
Creatinine, Ser: 0.95 mg/dL (ref 0.44–1.00)
GFR calc Af Amer: >60 mL/min (ref >60)
GFR calc non Af Amer: >60 mL/min (ref >60)
Glucose, Bld: 108 mg/dL — ABNORMAL HIGH (ref 65–99)
Potassium: 3.7 mmol/L (ref 3.5–5.1)
Sodium: 143 mmol/L (ref 135–145)

## 2017-06-06 LAB — CBC
HCT: 43.6 % (ref 36.0–46.0)
Hemoglobin: 14.9 g/dL (ref 12.0–15.0)
MCH: 32.5 pg (ref 26.0–34.0)
MCHC: 34.2 g/dL (ref 30.0–36.0)
MCV: 95.2 fL (ref 78.0–100.0)
Platelets: 379 10*3/uL (ref 150–400)
RBC: 4.58 MIL/uL (ref 3.87–5.11)
RDW: 13 % (ref 11.5–15.5)
WBC: 9.5 10*3/uL (ref 4.0–10.5)

## 2017-06-06 LAB — I-STAT TROPONIN, ED: Troponin i, poc: 0 ng/mL (ref 0.00–0.08)

## 2017-06-06 LAB — LIPASE, BLOOD: Lipase: 37 U/L (ref 11–51)

## 2017-06-06 LAB — MAGNESIUM: Magnesium: 2 mg/dL (ref 1.7–2.4)

## 2017-06-06 MED ORDER — ALBUTEROL SULFATE HFA 108 (90 BASE) MCG/ACT IN AERS
1.0000 | INHALATION_SPRAY | Freq: Once | RESPIRATORY_TRACT | Status: AC
  Administered 2017-06-06: 1 via RESPIRATORY_TRACT
  Filled 2017-06-06: qty 6.7

## 2017-06-06 MED ORDER — FAMOTIDINE IN NACL 20-0.9 MG/50ML-% IV SOLN
20.0000 mg | Freq: Once | INTRAVENOUS | Status: AC
  Administered 2017-06-06: 20 mg via INTRAVENOUS
  Filled 2017-06-06: qty 50

## 2017-06-06 MED ORDER — PREGABALIN 100 MG PO CAPS: 100.0000 mg | ORAL_CAPSULE | Freq: Two times a day (BID) | ORAL | 0 refills | Status: DC

## 2017-06-06 MED ORDER — IPRATROPIUM-ALBUTEROL 0.5-2.5 (3) MG/3ML IN SOLN
3.0000 mL | Freq: Once | RESPIRATORY_TRACT | Status: AC
  Administered 2017-06-06: 3 mL via RESPIRATORY_TRACT
  Filled 2017-06-06: qty 3

## 2017-06-06 MED ORDER — MORPHINE SULFATE (PF) 4 MG/ML IV SOLN
4.0000 mg | Freq: Once | INTRAVENOUS | Status: AC
  Administered 2017-06-06: 4 mg via INTRAVENOUS
  Filled 2017-06-06: qty 1

## 2017-06-06 MED ORDER — NYSTATIN 100000 UNIT/ML MT SUSP: 5.0000 mL | Freq: Three times a day (TID) | OROMUCOSAL | 0 refills | Status: AC

## 2017-06-06 MED ORDER — SODIUM CHLORIDE 0.9 % IV BOLUS
1000.0000 mL | Freq: Once | INTRAVENOUS | Status: AC
  Administered 2017-06-06: 1000 mL via INTRAVENOUS

## 2017-06-06 MED ORDER — FLUCONAZOLE 100 MG PO TABS: ORAL_TABLET | ORAL | 0 refills | Status: DC

## 2017-06-06 NOTE — ED Provider Notes (Signed)
[facility] DEPT Provider Note   CSN: [id number] Arrival date & time: 06/06/17  1002     History   Chief Complaint Chief Complaint  Patient presents with  . Shortness of Breath  . Chest Pain    HPI [name] is a 67 y.o. female with history of arrhythmia, C. difficile, fibromyalgia, GERD, substance abuse, hypertension, pancreatic cyst, CVA, and pneumococcal meningitis presents today for evaluation of chronic right-sided abdominal pain and chest pain.  She notes that she has been experiencing this pain constantly for 1 year and states "I just cannot take it anymore ".  She states pain began after she developed an ulcer from an antibiotic she was taking for meningitis.  She does follow-up with GI and has undergone endoscopy and colonoscopy.  Most recently she underwent endoscopy and colonoscopy in January which showed Barrett's esophagus and precancerous colon polyps.  She has been taking pantoprazole and Zofran without relief of her symptoms.  She notes a constant pain to the right side of the chest radiating into the right breast and down the right side of the abdomen which she states feels like an open sore.  Pain worsens after meals.  No alleviating factors noted.  She also endorses shortness of breath and dyspnea on exertion.  She notes nausea and occasional vomiting but not on a daily basis.  She has intermittent diarrhea and constipation, no melena or hematochezia recently.  She denies hematuria.  She is a current smoker of approximately half a pack of cigarettes daily.  Of note, patient was recently treated for a candidal esophagitis and her symptoms did improve for a couple of weeks but then worsened again.  She states her current symptoms feel like the way she felt prior to treatment for the esophagitis. She states she feels a "tickle in her throat".    The history is provided by the patient and the spouse.    Past Medical History:  Diagnosis Date    . Acute blood loss anemia   . Arrhythmia   . Arthritis   . C. difficile colitis   . Cancer ([place])   . Diverticulosis   . Fibromyalgia   . GERD (gastroesophageal reflux disease)   . GI hemorrhage   . H/O: substance abuse 2005   narcotic usage due to chronic back pain  . Heart murmur   . Hypertension   . Pancreatic cyst   . Pneumococcal meningitis   . PONV (postoperative nausea and vomiting)   . Renal cyst   . Stroke (cerebrum) [facility])     Patient Active Problem List   Diagnosis Date Noted  . Cervical dystonia 11/27/2016  . C. difficile colitis 09/10/2016  . Abdominal pain 09/08/2016  . Acute encephalopathy   . Fever   . Left hemiparesis ([place])   . Generalized OA   . Fibromyalgia   . Tobacco abuse   . Substance abuse ([place])   . Benign essential HTN   . Tachycardia   . Chronic pain syndrome   . Acute blood loss anemia   . Leukocytosis   . Septic thrombophlebitis of sagittal sinus   . Acute respiratory failure ([place])   . Streptococcus pneumoniae meningitis   . Streptococcal bacteremia   . Meningitis   . Cerebral embolism with cerebral infarction 04/12/2016  . Stroke (cerebrum) ([place]) 04/12/2016  . Neck pain 04/04/2016  . Chronic abdominal pain 02/19/2016  . Essential hypertension 02/19/2016  . Bacterial conjunctivitis 02/19/2016  . Health care maintenance 02/19/2016  .  ANXIETY 05/08/2006  . TOBACCO DEPENDENCE 05/08/2006  . ASTHMA, UNSPECIFIED 05/08/2006  . GASTROESOPHAGEAL REFLUX, NO ESOPHAGITIS 05/08/2006  . CONSTIPATION 05/08/2006  . CONVULSIONS, SEIZURES, NOS 05/08/2006    Past Surgical History:  Procedure Laterality Date  . ABDOMINAL HYSTERECTOMY    . ABDOMINAL SURGERY    . APPENDECTOMY    . BREAST SURGERY    . CHOLECYSTECTOMY N/A 09/30/2012   Procedure: LAPAROSCOPIC CHOLECYSTECTOMY WITH INTRAOPERATIVE CHOLANGIOGRAM;  Surgeon: Imogene Burn. Georgette Dover, MD;  Location: [facility] ORS;  Service: General;  Laterality: N/A;  . FRACTURE SURGERY     right upper arm  . OVARIAN  CYST SURGERY       OB History   None      Home Medications    Prior to Admission medications   Medication Sig Start Date End Date Taking? Authorizing Provider  acetaminophen (TYLENOL) 500 MG tablet Take 500-1,000 mg by mouth every 6 (six) hours as needed for moderate pain.    Yes [provider]  Carboxymethylcellulose Sodium (EYE DROPS OP) Place 1 drop into both eyes daily as needed (DRY EYES).   Yes [provider]  carisoprodol (SOMA) 350 MG tablet TAKE 1 TABLET BY MOUTH UP TO THREE TIMES DAILY AS NEEDED FOR MUSCLE SPASMS Patient taking differently: TAKE 1 TABLET (350MG ) BY MOUTH EVERY 6 HOURS. 03/19/17  Yes [name], MD  carvedilol (COREG) 6.25 MG tablet TAKE 1 TABLET(6.25 MG) BY MOUTH TWICE DAILY WITH A MEAL. FOLLOW UP IN OCT.2018 06/03/17  Yes [name], MD  ondansetron (ZOFRAN) 4 MG tablet Take 1 tablet (4 mg total) by mouth every 6 (six) hours as needed for nausea or vomiting. 02/25/17  Yes Pyrtle, Lajuan Lines, MD  pantoprazole (PROTONIX) 40 MG tablet Take 1 tablet (40 mg total) by mouth daily. 04/01/17  Yes Pyrtle, Lajuan Lines, MD  dicyclomine (BENTYL) 10 MG capsule Take 1 capsule (10 mg total) by mouth 4 (four) times daily -  before meals and at bedtime. Patient not taking: Reported on 06/06/2017 04/16/17   Jerene Bears, MD  fluconazole (DIFLUCAN) 100 MG tablet Take 1 tablet (100 mg total) by mouth daily. Take 2 tablets (200mg ) day one.  Take one tablet(100mg ) for 13 days. 06/06/17   Nils Flack, Wladyslaw Henrichs A, PA-C  nystatin (MYCOSTATIN) 100000 UNIT/ML suspension Take 5 mLs (500,000 Units total) by mouth every 8 (eight) hours for 7 days. 06/06/17 06/13/17  Rodell Perna A, PA-C  pregabalin (LYRICA) 100 MG capsule Take 1 capsule (100 mg total) by mouth 2 (two) times daily. 06/06/17   Geraldyn Shain A, PA-C  traMADol (ULTRAM) 50 MG tablet Take 1 tablet (50 mg total) by mouth every 8 (eight) hours as needed (Take 1-2 tabs every 8 hours as needed for pain). Patient not taking: Reported on  04/11/2017 02/28/17   Pyrtle, Lajuan Lines, MD    Family History Family History  Problem Relation Age of Onset  . Alzheimer's disease Mother   . Heart disease Father   . Prostate cancer Father     Social History Social History   Tobacco Use  . Smoking status: Current Some Day Smoker    Packs/day: 0.25    Years: 30.00    Pack years: 7.50    Types: Cigarettes  . Smokeless tobacco: Never Used  Substance Use Topics  . Alcohol use: No  . Drug use: No    Comment: Methadone for narcotic use     Allergies   Tylenol [acetaminophen]; Codeine; Nsaids; Tolmetin; and Tramadol  Review of Systems Review of Systems  Constitutional: Positive for chills. Negative for fever.  Respiratory: Positive for shortness of breath.   Cardiovascular: Positive for chest pain.  Gastrointestinal: Positive for abdominal pain, constipation, diarrhea, nausea and vomiting. Negative for blood in stool.  Genitourinary: Negative for dysuria, flank pain and hematuria.  All other systems reviewed and are negative.    Physical Exam Updated Vital Signs BP 134/70 (BP Location: Left Arm)   Pulse 67   Temp 98.3 F (36.8 C) (Oral)   Resp 16   SpO2 100%   Physical Exam  Constitutional: She appears well-developed and well-nourished. No distress.  Thin, resting in bed but appears uncomfortable  HENT:  Head: Normocephalic and atraumatic.  Eyes: Conjunctivae are normal. Right eye exhibits no discharge. Left eye exhibits no discharge.  Neck: Normal range of motion. Neck supple. No JVD present. No tracheal deviation present.  Cardiovascular: Normal rate and regular rhythm.  Pulmonary/Chest: Effort normal. No accessory muscle usage. No tachypnea. No respiratory distress. She has wheezes. She exhibits tenderness. She exhibits no crepitus and no deformity.  Scattered expiratory wheezing.  Equal rise and fall of chest, no increased work of breathing.  Speaking in full sentences without difficulty  Abdominal: Soft. Bowel  sounds are normal. She exhibits no distension. There is tenderness.  Tenderness to palpation in the epigastric, right upper quadrant, and right lower quadrant.  Percell Miller sign is absent, Rovsing's absent, no CVA tenderness  Musculoskeletal: She exhibits no edema.       Right lower leg: Normal. She exhibits no tenderness and no edema.       Left lower leg: Normal. She exhibits no tenderness and no edema.  Diffuse right-sided chest wall tenderness to palpation with no deformity, crepitus, or paradoxical wall motion  Neurological: She is alert.  Skin: Skin is warm and dry. No erythema.  Psychiatric: She has a normal mood and affect. Her behavior is normal.  Nursing note and vitals reviewed.    ED Treatments / Results  Labs (all labs ordered are listed, but only abnormal results are displayed) Labs Reviewed  BASIC METABOLIC PANEL - Abnormal; Notable for the following components:      Result Value   Glucose, Bld 108 (*)    BUN 23 (*)    All other components within normal limits  HEPATIC FUNCTION PANEL - Abnormal; Notable for the following components:   Bilirubin, Direct <0.1 (*)    All other components within normal limits  CBC  MAGNESIUM  LIPASE, BLOOD  I-STAT TROPONIN, ED    EKG EKG Interpretation  Date/Time:  Friday June 06 2017 10:09:02 EDT Ventricular Rate:  80 PR Interval:    QRS Duration: 104 QT Interval:  406 QTC Calculation: 469 R Axis:   4 Text Interpretation:  Sinus rhythm Consider right atrial enlargement No significant change since last tracing Confirmed by Blanchie Dessert [phone]) on 06/06/2017 12:48:40 PM   Radiology Dg Chest 2 View  Result Date: 06/06/2017 CLINICAL DATA:  Chest heaviness and shortness of breath. EXAM: CHEST - 2 VIEW COMPARISON:  Chest x-ray dated February 09, 2017. FINDINGS: The heart size and mediastinal contours are within normal limits. Normal pulmonary vascularity. The lungs remain hyperinflated. No focal consolidation, pleural effusion, or  pneumothorax. Bilateral nipple shadows noted. No acute osseous abnormality. IMPRESSION: 1. COPD.  No active cardiopulmonary disease. Electronically Signed   By: Titus Dubin M.D.   On: 06/06/2017 11:00    Procedures Procedures (including critical care time)  Medications Ordered in  ED Medications  morphine 4 MG/ML injection 4 mg (4 mg Intravenous Given 06/06/17 1306)  sodium chloride 0.9 % bolus 1,000 mL (0 mLs Intravenous Stopped 06/06/17 1424)  famotidine (PEPCID) IVPB 20 mg premix (0 mg Intravenous Stopped 06/06/17 1342)  ipratropium-albuterol (DUONEB) 0.5-2.5 (3) MG/3ML nebulizer solution 3 mL (3 mLs Nebulization Given 06/06/17 1312)  albuterol (PROVENTIL HFA;VENTOLIN HFA) 108 (90 Base) MCG/ACT inhaler 1 puff (1 puff Inhalation Given 06/06/17 1432)     Initial Impression / Assessment and Plan / ED Course  I have reviewed the triage vital signs and the nursing notes.  Pertinent labs & imaging results that were available during my care of the patient were reviewed by me and considered in my medical decision making (see chart for details).     Patient presents today complaining of chronic right-sided abdominal pain and right-sided chest pain.  She is being followed by GI for her symptoms.  She is afebrile, vital signs are stable.  She is uncomfortable but nontoxic in appearance.  She states most recently she has developed a recurrence of symptoms consistent with the last time that she had candidal esophagitis.  She and her husband expressed to me that they feel extremely frustrated by the healthcare system and feel that the medical system has failed them in finding a diagnosis and treatment for her chronic pain.Lab work reviewed by me is reassuring with no leukocytosis, no abnormal electrolytes, creatinine and lipase and LFTs within normal limits.She is not hypokalemic and her magnesium was also within normal limits.  Troponin is negative, EKG shows no significant changes from last tracing and   I doubt ACS or MI.  Chest x-ray yesterday did show findings consistent with COPD.  I offered the patient a breathing treatment and she does state that she feels better afterwards.  She is ambulatory without difficulty, no increased work of breathing.  She has not been hypoxic while in the ED.  I doubt PE.  I did encourage her to follow-up with a primary care physician for reevaluation of her COPD as she tells me she has not been diagnosed with this in the past.  Discussed case with Dr. Maryan Rued.  I think it is reasonable to treat her with Diflucan and nystatin mouthwash for possible recurrence of candidiasis.  She has also had good success with her neuropathic pain with Lyrica; we will refill her Lyrica and attempt a trial of this medication to see if it helps improve her abdominal pain.  She will follow-up with her gastroenterologist and her neurologist for reevaluation of her symptoms.  I doubt any acute surgical abdominal pathology.  She is tolerating p.o. fluids in the ED despite pain.  Discussed indications for return to the ED.  Patient and patient's husband verbalized understanding of and agreement with plan and patient stable for discharge home at this time. Final Clinical Impressions(s) / ED Diagnoses   Final diagnoses:  Abdominal pain in female  Atypical chest pain  Shortness of breath    ED Discharge Orders        Ordered    fluconazole (DIFLUCAN) 100 MG tablet     06/06/17 1423    pregabalin (LYRICA) 100 MG capsule  2 times daily     06/06/17 1423    nystatin (MYCOSTATIN) 100000 UNIT/ML suspension  Every 8 hours     06/06/17 1423      Renita Papa, PA-C 06/07/17 [id number]  Blanchie Dessert, MD 06/09/17 2052

## 2017-06-06 NOTE — Discharge Instructions (Addendum)
Start taking Diflucan as prescribed.  You may also take nystatin oral solution up to 3 times daily as prescribed.  Switch this medication around and swallow.  You may start taking Lyrica again but do not take 100 mg tablets immediately.  Cut these tablets in half and take half a tablet for 3 days before increasing to 1 full tablet twice daily.  You may use the albuterol inhaler as needed for shortness of breath up to every 4 hours.  If you find that you are using this inhaler more frequently than this and feeling short of breath still, return to the emergency department for evaluation.  Follow-up with your neurosurgeon and gastroenterologist for reevaluation of your symptoms.  Return to the emergency department if any concerning signs or symptoms develop.

## 2017-06-06 NOTE — ED Triage Notes (Signed)
Pt complains of shortness of breath and chest pain for the past week, nausea worse this morning.

## 2017-06-11 DIAGNOSIS — M4802 Spinal stenosis, cervical region: Secondary | ICD-10-CM | POA: Diagnosis not present

## 2017-06-19 ENCOUNTER — Ambulatory Visit (INDEPENDENT_AMBULATORY_CARE_PROVIDER_SITE_OTHER): Payer: Medicare Other | Admitting: Internal Medicine

## 2017-06-19 ENCOUNTER — Encounter: Payer: Self-pay | Admitting: Internal Medicine

## 2017-06-19 VITALS — BP 114/70 | HR 83 | Ht 64.0 in | Wt 110.8 lb

## 2017-06-19 DIAGNOSIS — R0609 Other forms of dyspnea: Secondary | ICD-10-CM

## 2017-06-19 DIAGNOSIS — J449 Chronic obstructive pulmonary disease, unspecified: Secondary | ICD-10-CM

## 2017-06-19 DIAGNOSIS — F17210 Nicotine dependence, cigarettes, uncomplicated: Secondary | ICD-10-CM | POA: Diagnosis not present

## 2017-06-19 DIAGNOSIS — R0600 Dyspnea, unspecified: Secondary | ICD-10-CM

## 2017-06-19 MED ORDER — BUDESONIDE-FORMOTEROL FUMARATE 80-4.5 MCG/ACT IN AERO: INHALATION_SPRAY | RESPIRATORY_TRACT | 11 refills | Status: DC

## 2017-06-19 MED ORDER — BUDESONIDE-FORMOTEROL FUMARATE 80-4.5 MCG/ACT IN AERO: 2.0000 | INHALATION_SPRAY | Freq: Two times a day (BID) | RESPIRATORY_TRACT | 0 refills | Status: DC

## 2017-06-19 NOTE — Patient Instructions (Addendum)
Protonix Take 30-60 min before first meal of the day and pepcid ac 20 mg 12 hours later   Try symbicort 80 Take 2 puffs first thing in am and then another 2 puffs about 12 hours later.     GERD (REFLUX)  is an extremely common cause of respiratory symptoms just like yours , many times with no obvious heartburn at all.    It can be treated with medication, but also with lifestyle changes including elevation of the head of your bed (ideally with 6 inch  bed blocks),  Smoking cessation, avoidance of late meals, excessive alcohol, and avoid fatty foods, chocolate, peppermint, colas, red wine, and acidic juices such as orange juice.  NO MINT OR MENTHOL PRODUCTS SO NO COUGH DROPS   USE  CANDY INSTEAD (Jolley ranchers or Stover's or Life Savers) or even ice chips will also do - the key is to swallow to prevent all throat clearing. NO OIL BASED VITAMINS - use powdered substitutes.  The key is to stop smoking completely before smoking completely stops you!    Please schedule a follow up office visit in 4 weeks, sooner if needed  with all medications /inhalers/ solutions in hand so we can verify exactly what you are taking. This includes all medications from all doctors and over the counters  - PFT's on return

## 2017-06-19 NOTE — Progress Notes (Signed)
Subjective:     Patient ID: [name], female   DOB: [birth year],    MRN: [id number]  HPI  51 yowf active smoker with h/o GERD/ Barrett's documented 03/26/17  Self referred to pulmonary clinic 06/19/2017 for sob     Admit date: 04/12/2016 Discharge date: 04/22/2016  Discharge Diagnoses:    Active Problems:   Cerebral embolism with cerebral infarction   Stroke (cerebrum) (HCC)   Acute respiratory failure (HCC)   Streptococcus pneumoniae meningitis   Streptococcal bacteremia   Septic thrombophlebitis of sagittal sinus   Fever   Left hemiparesis (HCC)   Generalized OA   Fibromyalgia   Tobacco abuse   Substance abuse   Benign essential HTN   Tachycardia   Chronic pain syndrome   Acute blood loss anemia   Leukocytosis    Follow-up recommendations Follow-up with PCP in 3-5 days , including all  additional recommended appointments as below Follow-up CBC, CMP in 3-5 days   Addendum Per case manager , patient needs to make arrangements for DC THEREFORE DC CANCELLED ON 2/12       Current Discharge Medication List        START taking these medications   Details  cefTRIAXone (ROCEPHIN) IVPB Inject 2 g into the vein every 12 (twelve) hours. Indication:  Pneumococcal bacteremia with septic sagittal sinus thrombosis Last Day of Therapy:  05/24/16 Labs - Once weekly:  CBC/D and BMP, Labs - Every other week:  ESR and CRP Qty: 68 Units, Refills: 0    docusate (COLACE) 50 MG/5ML liquid Place 10 mLs (100 mg total) into feeding tube 2 (two) times daily as needed for mild constipation. Qty: 100 mL, Refills: 0    feeding supplement, ENSURE ENLIVE, (ENSURE ENLIVE) LIQD Take 237 mLs by mouth 2 (two) times daily between meals. Qty: 237 mL, Refills: 12    levETIRAcetam (KEPPRA) 500 MG tablet Take 1 tablet (500 mg total) by mouth every 12 (twelve) hours. Qty: 60 tablet, Refills: 2    pantoprazole (PROTONIX) 40 MG tablet Take 1 tablet (40 mg total) by mouth at  bedtime. Qty: 30 tablet, Refills: 2    !! Rivaroxaban (XARELTO) 15 MG TABS tablet Take 1 tablet (15 mg total) by mouth 2 (two) times daily with a meal. Qty: 42 tablet, Refills: 1    !! rivaroxaban (XARELTO) 20 MG TABS tablet Take 1 tablet (20 mg total) by mouth daily with supper. Qty: 30 tablet, Refills: 3     !! - Potential duplicate medications found. Please discuss with provider.        CONTINUE these medications which have NOT CHANGED   Details  acetaminophen (TYLENOL) 500 MG tablet Take 1,000 mg by mouth 3 (three) times daily as needed for moderate pain.     HYDROcodone-acetaminophen (NORCO/VICODIN) 5-325 MG tablet Take 1 tablet by mouth 3 (three) times daily as needed (pain).    methadone (DOLOPHINE) 10 MG/ML solution Take 90 mg by mouth daily.     chlorhexidine (PERIDEX) 0.12 % solution 15 mL swish and spit bid Qty: 480 mL, Refills: 0    cyclobenzaprine (FLEXERIL) 10 MG tablet Take 1 tablet (10 mg total) by mouth 2 (two) times daily as needed for muscle spasms. Qty: 20 tablet, Refills: 0    lidocaine (XYLOCAINE) 2 % solution Use as directed 10 mLs in the mouth or throat every 6 (six) hours as needed for mouth pain. Hold in mouth and spit. Do not swallow. Qty: 200 mL, Refills: 0    ondansetron (  ZOFRAN ODT) 8 MG disintegrating tablet Take 1 tablet (8 mg total) by mouth every 8 (eight) hours as needed for nausea or vomiting. Qty: 20 tablet, Refills: 0         STOP taking these medications     diazepam (VALIUM) 5 MG tablet      lisinopril (PRINIVIL,ZESTRIL) 5 MG tablet      meloxicam (MOBIC) 15 MG tablet      traMADol (ULTRAM) 50 MG tablet          06/19/2017 1st [facility] office visit/ David Rodriquez   Chief Complaint  Patient presents with  . Pulmonary Consult    Self referral. Pt c/o SOB for the past several months. She states that she is SOB "all the time"- worse with exertion such as walking from room to room at home. She also c/o  "hacking cough"- hard to produce sputum, but it's clear when she does. She does not currently have a rescue inhaler, but before she ran out she was using this approx 4 x per day on average.   breathing not as good since ever  d/c from [facility] p meningitis but able to walk dog slow pace and do three step landing then around 1st of Jan 2019 breathing worse with hacking coughing no better with inhalers    Spells of sob occur even sitting may last for hours not necessarily with cough / speech is effected as well per husband Sleeps at 45 degrees = baseline about the same as prior to Jan 2019  Can't walk dog due to sob now  Since breathing is worse cough is also some worse>> min clear mucus Still having dysphagia / sore throat    No  obvious day to day or daytime variability or assoc excess/ purulent sputum or mucus plugs or hemoptysis or cp or chest tightness, subjective wheeze or overt sinus or hb symptoms. No unusual exposure hx or h/o childhood pna/ asthma or knowledge of premature birth.  Sleeping ok flat without nocturnal  or early am exacerbation  of respiratory  c/o's or need for noct saba. Also denies any obvious fluctuation of symptoms with weather or environmental changes or other aggravating or alleviating factors except as outlined above   Current Allergies, Complete Past Medical History, Past Surgical History, Family History, and Social History were reviewed in [employer] record.  ROS  The following are not active complaints unless bolded Hoarseness, sore throat, dysphagia, dental problems, itching, sneezing,  nasal congestion or discharge of excess mucus or purulent secretions, ear ache,   fever, chills, sweats, unintended wt loss or wt gain, classically pleuritic or exertional cp,  orthopnea pnd or leg swelling, presyncope, palpitations, abdominal pain, anorexia, nausea, vomiting, diarrhea  or change in bowel habits or change in bladder habits, change in stools or  change in urine, dysuria, hematuria,  rash, arthralgias, visual complaints, headache, numbness, weakness or ataxia or problems with walking or coordination,  change in mood/affect or memory.          Current Meds not able to confirm accuracy  Medication Sig  . acetaminophen (TYLENOL) 500 MG tablet Take 500-1,000 mg by mouth every 6 (six) hours as needed for moderate pain.   . Carboxymethylcellulose Sodium (EYE DROPS OP) Place 1 drop into both eyes daily as needed (DRY EYES).  . carisoprodol (SOMA) 350 MG tablet TAKE 1 TABLET BY MOUTH UP TO THREE TIMES DAILY AS NEEDED FOR MUSCLE SPASMS (Patient taking differently: TAKE 1 TABLET (350MG ) BY MOUTH  EVERY 6 HOURS.)  . carvedilol (COREG) 6.25 MG tablet TAKE 1 TABLET(6.25 MG) BY MOUTH TWICE DAILY WITH A MEAL. FOLLOW UP IN OCT.2018  . fluconazole (DIFLUCAN) 100 MG tablet Take 1 tablet (100 mg total) by mouth daily. Take 2 tablets (200mg ) day one.  Take one tablet(100mg ) for 13 days.  Marland Kitchen gabapentin (NEURONTIN) 300 MG capsule Take 1 capsule by mouth 3 (three) times daily.  . ondansetron (ZOFRAN) 4 MG tablet Take 1 tablet (4 mg total) by mouth every 6 (six) hours as needed for nausea or vomiting.  . pantoprazole (PROTONIX) 40 MG tablet Take 1 tablet (40 mg total) by mouth daily.            Review of Systems     Objective:   Physical Exam      Chronically ill hoarse wf nad   Wt Readings from Last 3 Encounters:  06/19/17 110 lb 12.8 oz (50.3 kg)  03/26/17 113 lb (51.3 kg)  02/25/17 113 lb (51.3 kg)     Vital signs reviewed - Note on arrival 02 sats  99% on RA      Very poor air movement / quite hoarse   HEENT: nl  turbinates bilaterally, and oropharynx. Nl external ear canals without cough reflex/denture top/ poor lower    NECK :  without JVD/Nodes/TM/ nl carotid upstrokes bilaterally   LUNGS: no acc muscle use,   Lungs with poor air movement bilaterally with lots of pseudowheezes    CV:  RRR  no s3 or murmur or increase in P2,  and no edema   ABD:  soft and nontender with nl inspiratory excursion in the supine position. No bruits or organomegaly appreciated, bowel sounds nl  MS:  Nl gait/ ext warm without deformities, calf tenderness, cyanosis or clubbing No obvious joint restrictions   SKIN: warm and dry without lesions    NEURO:  alert, approp, nl sensorium with  no motor or cerebellar deficits apparent.     I personally reviewed images and agree with radiology impression as follows:  CXR:   06/06/17 1. COPD.  No active cardiopulmonary disease.    Assessment:

## 2017-06-20 ENCOUNTER — Encounter: Payer: Self-pay | Admitting: Internal Medicine

## 2017-06-20 DIAGNOSIS — J449 Chronic obstructive pulmonary disease, unspecified: Secondary | ICD-10-CM | POA: Insufficient documentation

## 2017-06-20 DIAGNOSIS — J441 Chronic obstructive pulmonary disease with (acute) exacerbation: Secondary | ICD-10-CM | POA: Insufficient documentation

## 2017-06-20 NOTE — Assessment & Plan Note (Signed)

## 2017-06-20 NOTE — Assessment & Plan Note (Signed)
06/19/2017  Walked RA x 3 laps @ 185 ft each stopped due to  End of study,  slow pace,  No desat   - Spirometry 06/19/2017  Not physiologic     Has lots of upper airway noise reflected in f/v curve - hopefully treating gerd aggressively will help along with just using very low dose symbicort for now

## 2017-06-20 NOTE — Assessment & Plan Note (Addendum)
06/19/2017  Walked RA x 3 laps @ 185 ft each stopped due to  End of study,  slow pace,  No desat   - Spirometry 06/19/2017  Not physiologic  - 06/19/2017  After extensive coaching inhaler device  effectiveness =    50%  From a baseline near 0 > try symb 80 Take 2 puffs first thing in am and then another 2 puffs about 12 hours later.   Symptoms are markedly disproportionate to objective findings and not clear to what extent this is actually a pulmonary  problem but pt does appear to have difficult to sort out respiratory symptoms of unknown origin for which  DDX  = almost all start with A and  include Adherence, Ace Inhibitors, Acid Reflux, Active Sinus Disease, Alpha 1 Antitripsin deficiency, Anxiety masquerading as Airways dz,  ABPA,  Allergy(esp in young), Aspiration (esp in elderly), Adverse effects of meds,  Active smokers, A bunch of PE's/clot burden (a few small clots can't cause this syndrome unless there is already severe underlying pulm or vascular dz with poor reserve),  Anemia or thyroid disorder, plus two Bs  = Bronchiectasis and Beta blocker use..and one C= CHF     Adherence is always the initial "prime suspect" and is a multilayered concern that requires a "trust but verify" approach in every patient - starting with knowing how to use medications, especially inhalers, correctly, keeping up with refills and understanding the fundamental difference between maintenance and prns vs those medications only taken for a very short course and then stopped and not refilled.  - see hfa teaching  - return with all meds in hand using a trust but verify approach to confirm accurate Medication  Reconciliation The principal here is that until we are certain that the  patients are doing what we've asked, it makes no sense to ask them to do more.    Active smoking > see sep a/p  ? Acid (or non-acid) GERD > always difficult to exclude as up to 75% of pts in some series report no assoc GI/ Heartburn symptoms  and she has documented Barrett's  > rec max (24h)  acid suppression and diet restrictions/ reviewed and instructions given in writing.    ?asthma component > doubt, and high dose ICS may backfire here with lot of baseline upper airway symptoms so rec leave on low dose for now  ? Anxiety > usually at the bottom of this list of usual suspects but should be much higher on this pt's based on H and P  and may interfere with adherence and also interpretation of response or lack thereof to symptom management which can be quite subjective.    ? BB effects > In the setting of respiratory symptoms of unknown etiology,  It would be preferable to use bystolic, the most beta -1  selective Beta blocker available in sample form, with bisoprolol the most selective generic choice  on the market, at least on a trial basis, to make sure the spillover Beta 2 effects of the less specific Beta blockers are not contributing to this patient's symptoms.     Total time devoted to counseling  > 50 % of initial 60 min office visit:  review case with pt/ discussion of options/alternatives/ personally creating written customized instructions  in presence of pt  then going over those specific  Instructions directly with the pt including how to use all of the meds but in particular covering each new medication in detail and the  difference between the maintenance= "automatic" meds and the prns using an action plan format for the latter (If this problem/symptom => do that organization reading Left to right).  Please see AVS from this visit for a full list of these instructions which I personally wrote for this pt and  are unique to this visit.

## 2017-06-25 DIAGNOSIS — I10 Essential (primary) hypertension: Secondary | ICD-10-CM | POA: Diagnosis not present

## 2017-06-25 DIAGNOSIS — Z87891 Personal history of nicotine dependence: Secondary | ICD-10-CM | POA: Diagnosis not present

## 2017-06-25 DIAGNOSIS — F17200 Nicotine dependence, unspecified, uncomplicated: Secondary | ICD-10-CM | POA: Diagnosis not present

## 2017-06-25 DIAGNOSIS — K2270 Barrett's esophagus without dysplasia: Secondary | ICD-10-CM | POA: Diagnosis not present

## 2017-06-25 DIAGNOSIS — E559 Vitamin D deficiency, unspecified: Secondary | ICD-10-CM | POA: Diagnosis not present

## 2017-06-25 DIAGNOSIS — R5383 Other fatigue: Secondary | ICD-10-CM | POA: Diagnosis not present

## 2017-07-03 DIAGNOSIS — J449 Chronic obstructive pulmonary disease, unspecified: Secondary | ICD-10-CM | POA: Diagnosis not present

## 2017-07-03 DIAGNOSIS — K209 Esophagitis, unspecified: Secondary | ICD-10-CM | POA: Diagnosis not present

## 2017-07-03 DIAGNOSIS — K2270 Barrett's esophagus without dysplasia: Secondary | ICD-10-CM | POA: Diagnosis not present

## 2017-07-03 DIAGNOSIS — Z87891 Personal history of nicotine dependence: Secondary | ICD-10-CM | POA: Diagnosis not present

## 2017-07-08 ENCOUNTER — Other Ambulatory Visit: Payer: Self-pay | Admitting: Family Medicine

## 2017-07-08 DIAGNOSIS — Z8719 Personal history of other diseases of the digestive system: Secondary | ICD-10-CM

## 2017-07-11 DIAGNOSIS — F329 Major depressive disorder, single episode, unspecified: Secondary | ICD-10-CM | POA: Diagnosis not present

## 2017-07-11 DIAGNOSIS — G4700 Insomnia, unspecified: Secondary | ICD-10-CM | POA: Diagnosis not present

## 2017-07-11 DIAGNOSIS — F419 Anxiety disorder, unspecified: Secondary | ICD-10-CM | POA: Diagnosis not present

## 2017-07-11 DIAGNOSIS — Z79899 Other long term (current) drug therapy: Secondary | ICD-10-CM | POA: Diagnosis not present

## 2017-07-17 DIAGNOSIS — G4700 Insomnia, unspecified: Secondary | ICD-10-CM | POA: Diagnosis not present

## 2017-07-17 DIAGNOSIS — K209 Esophagitis, unspecified: Secondary | ICD-10-CM | POA: Diagnosis not present

## 2017-07-17 DIAGNOSIS — J449 Chronic obstructive pulmonary disease, unspecified: Secondary | ICD-10-CM | POA: Diagnosis not present

## 2017-07-17 DIAGNOSIS — K2270 Barrett's esophagus without dysplasia: Secondary | ICD-10-CM | POA: Diagnosis not present

## 2017-07-23 DIAGNOSIS — F17200 Nicotine dependence, unspecified, uncomplicated: Secondary | ICD-10-CM | POA: Diagnosis not present

## 2017-07-23 DIAGNOSIS — F329 Major depressive disorder, single episode, unspecified: Secondary | ICD-10-CM | POA: Diagnosis not present

## 2017-07-23 DIAGNOSIS — F419 Anxiety disorder, unspecified: Secondary | ICD-10-CM | POA: Diagnosis not present

## 2017-07-23 DIAGNOSIS — F39 Unspecified mood [affective] disorder: Secondary | ICD-10-CM | POA: Diagnosis not present

## 2017-07-23 DIAGNOSIS — Z87891 Personal history of nicotine dependence: Secondary | ICD-10-CM | POA: Diagnosis not present

## 2017-07-25 ENCOUNTER — Ambulatory Visit: Payer: Medicare Other | Admitting: Internal Medicine

## 2017-08-05 ENCOUNTER — Other Ambulatory Visit: Payer: Self-pay | Admitting: Family Medicine

## 2017-08-05 ENCOUNTER — Encounter: Payer: Self-pay | Admitting: Family Medicine

## 2017-08-07 DIAGNOSIS — J449 Chronic obstructive pulmonary disease, unspecified: Secondary | ICD-10-CM | POA: Diagnosis not present

## 2017-08-07 DIAGNOSIS — G4700 Insomnia, unspecified: Secondary | ICD-10-CM | POA: Diagnosis not present

## 2017-08-07 DIAGNOSIS — G43109 Migraine with aura, not intractable, without status migrainosus: Secondary | ICD-10-CM | POA: Diagnosis not present

## 2017-08-07 DIAGNOSIS — C4490 Unspecified malignant neoplasm of skin, unspecified: Secondary | ICD-10-CM | POA: Diagnosis not present

## 2017-09-05 DIAGNOSIS — I10 Essential (primary) hypertension: Secondary | ICD-10-CM | POA: Diagnosis not present

## 2017-09-05 DIAGNOSIS — G4700 Insomnia, unspecified: Secondary | ICD-10-CM | POA: Diagnosis not present

## 2017-09-05 DIAGNOSIS — F329 Major depressive disorder, single episode, unspecified: Secondary | ICD-10-CM | POA: Diagnosis not present

## 2017-09-05 DIAGNOSIS — F419 Anxiety disorder, unspecified: Secondary | ICD-10-CM | POA: Diagnosis not present

## 2017-10-03 DIAGNOSIS — F329 Major depressive disorder, single episode, unspecified: Secondary | ICD-10-CM | POA: Diagnosis not present

## 2017-10-03 DIAGNOSIS — F419 Anxiety disorder, unspecified: Secondary | ICD-10-CM | POA: Diagnosis not present

## 2017-10-03 DIAGNOSIS — G4700 Insomnia, unspecified: Secondary | ICD-10-CM | POA: Diagnosis not present

## 2017-10-03 DIAGNOSIS — I10 Essential (primary) hypertension: Secondary | ICD-10-CM | POA: Diagnosis not present

## 2017-11-03 DIAGNOSIS — K2270 Barrett's esophagus without dysplasia: Secondary | ICD-10-CM | POA: Diagnosis not present

## 2017-11-03 DIAGNOSIS — B370 Candidal stomatitis: Secondary | ICD-10-CM | POA: Diagnosis not present

## 2017-11-03 DIAGNOSIS — G243 Spasmodic torticollis: Secondary | ICD-10-CM | POA: Diagnosis not present

## 2017-11-03 DIAGNOSIS — K209 Esophagitis, unspecified: Secondary | ICD-10-CM | POA: Diagnosis not present

## 2017-11-10 ENCOUNTER — Emergency Department (HOSPITAL_COMMUNITY)
Admission: EM | Admit: 2017-11-10 | Discharge: 2017-11-10 | Disposition: A | Discharge status: Home / Self Care | Payer: Medicare Other | Attending: Emergency Medicine | Admitting: Emergency Medicine

## 2017-11-10 ENCOUNTER — Other Ambulatory Visit: Payer: Self-pay

## 2017-11-10 ENCOUNTER — Encounter (HOSPITAL_COMMUNITY): Payer: Self-pay | Admitting: Emergency Medicine

## 2017-11-10 ENCOUNTER — Emergency Department (HOSPITAL_COMMUNITY): Payer: Medicare Other

## 2017-11-10 DIAGNOSIS — R109 Unspecified abdominal pain: Secondary | ICD-10-CM | POA: Diagnosis not present

## 2017-11-10 DIAGNOSIS — R079 Chest pain, unspecified: Secondary | ICD-10-CM | POA: Diagnosis not present

## 2017-11-10 DIAGNOSIS — Z5321 Procedure and treatment not carried out due to patient leaving prior to being seen by health care provider: Secondary | ICD-10-CM | POA: Diagnosis not present

## 2017-11-10 DIAGNOSIS — R52 Pain, unspecified: Secondary | ICD-10-CM | POA: Diagnosis not present

## 2017-11-10 DIAGNOSIS — R0902 Hypoxemia: Secondary | ICD-10-CM | POA: Diagnosis not present

## 2017-11-10 DIAGNOSIS — R1084 Generalized abdominal pain: Secondary | ICD-10-CM | POA: Diagnosis not present

## 2017-11-10 LAB — CBC
HCT: 44 % (ref 36.0–46.0)
Hemoglobin: 14.6 g/dL (ref 12.0–15.0)
MCH: 31.5 pg (ref 26.0–34.0)
MCHC: 33.2 g/dL (ref 30.0–36.0)
MCV: 94.8 fL (ref 78.0–100.0)
Platelets: 321 10*3/uL (ref 150–400)
RBC: 4.64 MIL/uL (ref 3.87–5.11)
RDW: 13.9 % (ref 11.5–15.5)
WBC: 8.4 10*3/uL (ref 4.0–10.5)

## 2017-11-10 LAB — COMPREHENSIVE METABOLIC PANEL
ALT: 15 U/L (ref 0–44)
AST: 18 U/L (ref 15–41)
Albumin: 4.3 g/dL (ref 3.5–5.0)
Alkaline Phosphatase: 84 U/L (ref 38–126)
Anion gap: 9 (ref 5–15)
BUN: 10 mg/dL (ref 8–23)
CO2: 29 mmol/L (ref 22–32)
Calcium: 9.8 mg/dL (ref 8.9–10.3)
Chloride: 108 mmol/L (ref 98–111)
Creatinine, Ser: 0.83 mg/dL (ref 0.44–1.00)
GFR calc Af Amer: >60 mL/min (ref >60)
GFR calc non Af Amer: >60 mL/min (ref >60)
Glucose, Bld: 100 mg/dL — ABNORMAL HIGH (ref 70–99)
Potassium: 4.1 mmol/L (ref 3.5–5.1)
Sodium: 146 mmol/L — ABNORMAL HIGH (ref 135–145)
Total Bilirubin: 0.6 mg/dL (ref 0.3–1.2)
Total Protein: 7.2 g/dL (ref 6.5–8.1)

## 2017-11-10 LAB — I-STAT TROPONIN, ED: Troponin i, poc: 0.02 ng/mL (ref 0.00–0.08)

## 2017-11-10 LAB — URINALYSIS, ROUTINE W REFLEX MICROSCOPIC
Bilirubin Urine: NEGATIVE
Glucose, UA: NEGATIVE mg/dL
Ketones, ur: 5 mg/dL — AB
Leukocytes, UA: NEGATIVE
Nitrite: NEGATIVE
Protein, ur: NEGATIVE mg/dL
Specific Gravity, Urine: 1.017 (ref 1.005–1.030)
pH: 6 (ref 5.0–8.0)

## 2017-11-10 LAB — LIPASE, BLOOD: Lipase: 37 U/L (ref 11–51)

## 2017-11-10 NOTE — ED Triage Notes (Signed)
Pt arrived [facility] from home for c/o RUQ pain and generally not feeling well x a few weeks. EMS reports that pt has had a recent death in her family and has a hx of anxiety as well.  SR 80-90 BP 120/80 RR 16

## 2017-11-10 NOTE — ED Triage Notes (Signed)
Pt reporting shortness of breath as well stating that she has had a hard time getting from room as well

## 2017-11-10 NOTE — ED Notes (Signed)
Became angry with staff and spouse yelling in lobby and left

## 2017-11-11 DIAGNOSIS — G243 Spasmodic torticollis: Secondary | ICD-10-CM | POA: Diagnosis not present

## 2017-11-11 DIAGNOSIS — I10 Essential (primary) hypertension: Secondary | ICD-10-CM | POA: Diagnosis not present

## 2017-11-11 DIAGNOSIS — G4700 Insomnia, unspecified: Secondary | ICD-10-CM | POA: Diagnosis not present

## 2017-12-02 DIAGNOSIS — K2270 Barrett's esophagus without dysplasia: Secondary | ICD-10-CM | POA: Diagnosis not present

## 2017-12-02 DIAGNOSIS — F329 Major depressive disorder, single episode, unspecified: Secondary | ICD-10-CM | POA: Diagnosis not present

## 2017-12-02 DIAGNOSIS — F419 Anxiety disorder, unspecified: Secondary | ICD-10-CM | POA: Diagnosis not present

## 2017-12-02 DIAGNOSIS — G4700 Insomnia, unspecified: Secondary | ICD-10-CM | POA: Diagnosis not present

## 2017-12-08 ENCOUNTER — Telehealth: Payer: Self-pay

## 2017-12-08 DIAGNOSIS — Z79899 Other long term (current) drug therapy: Secondary | ICD-10-CM | POA: Diagnosis not present

## 2017-12-08 DIAGNOSIS — G4700 Insomnia, unspecified: Secondary | ICD-10-CM | POA: Diagnosis not present

## 2017-12-08 DIAGNOSIS — G8929 Other chronic pain: Secondary | ICD-10-CM | POA: Diagnosis not present

## 2017-12-08 DIAGNOSIS — R1084 Generalized abdominal pain: Secondary | ICD-10-CM | POA: Diagnosis not present

## 2017-12-08 DIAGNOSIS — M542 Cervicalgia: Secondary | ICD-10-CM | POA: Diagnosis not present

## 2017-12-08 NOTE — Telephone Encounter (Signed)
Pts husband called and states pt having pain. H/O liver lesions/pancreas cyst. Called and left message for pt to call back.

## 2017-12-09 NOTE — Telephone Encounter (Signed)
Pt/Husband never returned call.

## 2017-12-31 DIAGNOSIS — G8929 Other chronic pain: Secondary | ICD-10-CM | POA: Diagnosis not present

## 2017-12-31 DIAGNOSIS — M542 Cervicalgia: Secondary | ICD-10-CM | POA: Diagnosis not present

## 2017-12-31 DIAGNOSIS — R1084 Generalized abdominal pain: Secondary | ICD-10-CM | POA: Diagnosis not present

## 2017-12-31 DIAGNOSIS — Z87891 Personal history of nicotine dependence: Secondary | ICD-10-CM | POA: Diagnosis not present

## 2018-01-04 ENCOUNTER — Encounter (HOSPITAL_COMMUNITY): Payer: Self-pay

## 2018-01-04 ENCOUNTER — Emergency Department (HOSPITAL_COMMUNITY): Payer: Medicare Other

## 2018-01-04 ENCOUNTER — Other Ambulatory Visit: Payer: Self-pay

## 2018-01-04 ENCOUNTER — Emergency Department (HOSPITAL_COMMUNITY)
Admission: EM | Admit: 2018-01-04 | Discharge: 2018-01-04 | Disposition: A | Discharge status: Home / Self Care | Payer: Medicare Other | Attending: Emergency Medicine | Admitting: Emergency Medicine

## 2018-01-04 DIAGNOSIS — R0602 Shortness of breath: Secondary | ICD-10-CM | POA: Diagnosis not present

## 2018-01-04 DIAGNOSIS — I10 Essential (primary) hypertension: Secondary | ICD-10-CM | POA: Diagnosis not present

## 2018-01-04 DIAGNOSIS — N281 Cyst of kidney, acquired: Secondary | ICD-10-CM | POA: Diagnosis not present

## 2018-01-04 DIAGNOSIS — R1084 Generalized abdominal pain: Secondary | ICD-10-CM | POA: Insufficient documentation

## 2018-01-04 DIAGNOSIS — F419 Anxiety disorder, unspecified: Secondary | ICD-10-CM | POA: Insufficient documentation

## 2018-01-04 DIAGNOSIS — K5900 Constipation, unspecified: Secondary | ICD-10-CM | POA: Diagnosis not present

## 2018-01-04 DIAGNOSIS — F17210 Nicotine dependence, cigarettes, uncomplicated: Secondary | ICD-10-CM | POA: Diagnosis not present

## 2018-01-04 DIAGNOSIS — R109 Unspecified abdominal pain: Secondary | ICD-10-CM | POA: Diagnosis present

## 2018-01-04 DIAGNOSIS — J449 Chronic obstructive pulmonary disease, unspecified: Secondary | ICD-10-CM | POA: Insufficient documentation

## 2018-01-04 LAB — COMPREHENSIVE METABOLIC PANEL
ALT: 17 U/L (ref 0–44)
AST: 19 U/L (ref 15–41)
Albumin: 4.2 g/dL (ref 3.5–5.0)
Alkaline Phosphatase: 75 U/L (ref 38–126)
Anion gap: 10 (ref 5–15)
BUN: 13 mg/dL (ref 8–23)
CO2: 26 mmol/L (ref 22–32)
Calcium: 9.6 mg/dL (ref 8.9–10.3)
Chloride: 110 mmol/L (ref 98–111)
Creatinine, Ser: 0.75 mg/dL (ref 0.44–1.00)
GFR calc Af Amer: >60 mL/min (ref >60)
GFR calc non Af Amer: >60 mL/min (ref >60)
Glucose, Bld: 101 mg/dL — ABNORMAL HIGH (ref 70–99)
Potassium: 3.5 mmol/L (ref 3.5–5.1)
Sodium: 146 mmol/L — ABNORMAL HIGH (ref 135–145)
Total Bilirubin: 0.4 mg/dL (ref 0.3–1.2)
Total Protein: 7.2 g/dL (ref 6.5–8.1)

## 2018-01-04 LAB — URINALYSIS, ROUTINE W REFLEX MICROSCOPIC
Bilirubin Urine: NEGATIVE
Glucose, UA: NEGATIVE mg/dL
Ketones, ur: NEGATIVE mg/dL
Leukocytes, UA: NEGATIVE
Nitrite: NEGATIVE
Protein, ur: NEGATIVE mg/dL
Specific Gravity, Urine: 1.006 (ref 1.005–1.030)
pH: 7 (ref 5.0–8.0)

## 2018-01-04 LAB — CBC
HCT: 41.5 % (ref 36.0–46.0)
Hemoglobin: 13.7 g/dL (ref 12.0–15.0)
MCH: 31.3 pg (ref 26.0–34.0)
MCHC: 33 g/dL (ref 30.0–36.0)
MCV: 94.7 fL (ref 80.0–100.0)
Platelets: 335 10*3/uL (ref 150–400)
RBC: 4.38 MIL/uL (ref 3.87–5.11)
RDW: 14.1 % (ref 11.5–15.5)
WBC: 9 10*3/uL (ref 4.0–10.5)
nRBC: 0 % (ref 0.0–0.2)

## 2018-01-04 LAB — LIPASE, BLOOD: Lipase: 34 U/L (ref 11–51)

## 2018-01-04 MED ORDER — ONDANSETRON HCL 4 MG/2ML IJ SOLN
4.0000 mg | Freq: Once | INTRAMUSCULAR | Status: AC
  Administered 2018-01-04: 4 mg via INTRAVENOUS
  Filled 2018-01-04: qty 2

## 2018-01-04 MED ORDER — HYDROMORPHONE HCL 2 MG/ML IJ SOLN
2.0000 mg | Freq: Once | INTRAMUSCULAR | Status: AC
  Administered 2018-01-04: 2 mg via INTRAVENOUS
  Filled 2018-01-04: qty 1

## 2018-01-04 MED ORDER — ONDANSETRON 4 MG PO TBDP: 4.0000 mg | ORAL_TABLET | Freq: Three times a day (TID) | ORAL | 0 refills | Status: DC | PRN

## 2018-01-04 MED ORDER — LORAZEPAM 2 MG/ML IJ SOLN
1.0000 mg | Freq: Once | INTRAMUSCULAR | Status: AC
  Administered 2018-01-04: 1 mg via INTRAVENOUS
  Filled 2018-01-04: qty 1

## 2018-01-04 MED ORDER — MORPHINE SULFATE (PF) 4 MG/ML IV SOLN
4.0000 mg | Freq: Once | INTRAVENOUS | Status: AC
  Administered 2018-01-04: 4 mg via INTRAVENOUS
  Filled 2018-01-04: qty 1

## 2018-01-04 MED ORDER — POLYETHYLENE GLYCOL 3350 17 G PO PACK: 17.0000 g | PACK | Freq: Every day | ORAL | 0 refills | Status: DC

## 2018-01-04 MED ORDER — IOPAMIDOL (ISOVUE-300) INJECTION 61%
100.0000 mL | Freq: Once | INTRAVENOUS | Status: AC | PRN
  Administered 2018-01-04: 100 mL via INTRAVENOUS

## 2018-01-04 MED ORDER — IOPAMIDOL (ISOVUE-300) INJECTION 61%
INTRAVENOUS | Status: AC
  Filled 2018-01-04: qty 100

## 2018-01-04 MED ORDER — SODIUM CHLORIDE 0.9 % IV BOLUS
1000.0000 mL | Freq: Once | INTRAVENOUS | Status: AC
  Administered 2018-01-04: 1000 mL via INTRAVENOUS

## 2018-01-04 MED ORDER — SODIUM CHLORIDE 0.9 % IJ SOLN
INTRAMUSCULAR | Status: AC
  Filled 2018-01-04: qty 50

## 2018-01-04 NOTE — ED Provider Notes (Signed)
[facility] DEPT Provider Note   CSN: [id number] Arrival date & time: 01/04/18  1125     History   Chief Complaint Chief Complaint  Patient presents with  . Abdominal Pain  . Shortness of Breath    HPI [name] is a 67 y.o. female.  Pt presents to the ED today with abdominal pain and sob.  The pt said she has had abdominal pain and sob ever since she had bacterial meningitis in Feb. 2018.  She said her pcp has been trying to treat her, but sx are worsening.  The pt denies n/v.      Past Medical History:  Diagnosis Date  . Acute blood loss anemia   . Arrhythmia   . Arthritis   . C. difficile colitis   . Cancer ([place])   . Diverticulosis   . Fibromyalgia   . GERD (gastroesophageal reflux disease)   . GI hemorrhage   . H/O: substance abuse ([place]) 2005   narcotic usage due to chronic back pain  . Heart murmur   . Hypertension   . Pancreatic cyst   . Pneumococcal meningitis   . PONV (postoperative nausea and vomiting)   . Renal cyst   . Stroke (cerebrum) [facility])     Patient Active Problem List   Diagnosis Date Noted  . COPD pfts pending  06/20/2017  . DOE (dyspnea on exertion) 06/19/2017  . Cervical dystonia 11/27/2016  . C. difficile colitis 09/10/2016  . Abdominal pain 09/08/2016  . Acute encephalopathy   . Fever   . Left hemiparesis ([employer])   . Generalized OA   . Fibromyalgia   . Tobacco abuse   . Substance abuse ([place])   . Benign essential HTN   . Tachycardia   . Chronic pain syndrome   . Acute blood loss anemia   . Leukocytosis   . Septic thrombophlebitis of sagittal sinus   . Acute respiratory failure ([place])   . Streptococcus pneumoniae meningitis   . Streptococcal bacteremia   . Meningitis   . Cerebral embolism with cerebral infarction 04/12/2016  . Stroke (cerebrum) ([place]) 04/12/2016  . Neck pain 04/04/2016  . Chronic abdominal pain 02/19/2016  . Essential hypertension 02/19/2016  . Bacterial conjunctivitis  02/19/2016  . Health care maintenance 02/19/2016  . ANXIETY 05/08/2006  . Cigarette smoker 05/08/2006  . ASTHMA, UNSPECIFIED 05/08/2006  . GASTROESOPHAGEAL REFLUX, NO ESOPHAGITIS 05/08/2006  . CONSTIPATION 05/08/2006  . CONVULSIONS, SEIZURES, NOS 05/08/2006    Past Surgical History:  Procedure Laterality Date  . ABDOMINAL HYSTERECTOMY    . ABDOMINAL SURGERY    . APPENDECTOMY    . BREAST SURGERY    . CHOLECYSTECTOMY N/A 09/30/2012   Procedure: LAPAROSCOPIC CHOLECYSTECTOMY WITH INTRAOPERATIVE CHOLANGIOGRAM;  Surgeon: Imogene Burn. Georgette Dover, MD;  Location: [facility] ORS;  Service: General;  Laterality: N/A;  . FRACTURE SURGERY     right upper arm  . OVARIAN CYST SURGERY       OB History   None      Home Medications    Prior to Admission medications   Medication Sig Start Date End Date Taking? Authorizing Provider  budesonide-formoterol (SYMBICORT) 80-4.5 MCG/ACT inhaler Take 2 puffs first thing in am and then another 2 puffs about 12 hours later. 06/19/17  Yes Tanda Rockers, MD  carisoprodol (SOMA) 350 MG tablet TAKE 1 TABLET BY MOUTH UP TO THREE TIMES DAILY AS NEEDED FOR MUSCLE SPASMS Patient taking differently: Take 350 mg by mouth 3 (three) times daily.  03/19/17  Yes [name], MD  carvedilol (COREG) 6.25 MG tablet Take 6.25 mg by mouth 3 (three) times daily.  12/25/17  Yes [provider]  clonazePAM (KLONOPIN) 0.5 MG tablet Take 0.5 mg by mouth 4 (four) times daily as needed for anxiety.  12/02/17  Yes [provider]  doxepin (SINEQUAN) 100 MG capsule Take 100 mg by mouth at bedtime.  11/02/17  Yes [provider]  fluconazole (DIFLUCAN) 100 MG tablet Take 1 tablet (100 mg total) by mouth daily. Take 2 tablets (200mg ) day one.  Take one tablet(100mg ) for 13 days. 06/06/17  Yes Fawze, Mina A, PA-C  ondansetron (ZOFRAN) 4 MG tablet Take 1 tablet (4 mg total) by mouth every 6 (six) hours as needed for nausea or vomiting. 02/25/17  Yes Pyrtle, Lajuan Lines, MD    pantoprazole (PROTONIX) 40 MG tablet Take 1 tablet (40 mg total) by mouth daily. 04/01/17  Yes Pyrtle, Lajuan Lines, MD  temazepam (RESTORIL) 15 MG capsule Take 15 mg by mouth at bedtime.  12/02/17  Yes [provider]  Carboxymethylcellulose Sodium (EYE DROPS OP) Place 1 drop into both eyes daily as needed (DRY EYES).    [provider]  ondansetron (ZOFRAN ODT) 4 MG disintegrating tablet Take 1 tablet (4 mg total) by mouth every 8 (eight) hours as needed. 01/04/18   Isla Pence, MD  polyethylene glycol [facility]) packet Take 17 g by mouth daily. 01/04/18   Isla Pence, MD    Family History Family History  Problem Relation Age of Onset  . Alzheimer's disease Mother   . Heart disease Father   . Prostate cancer Father     Social History Social History   Tobacco Use  . Smoking status: Current Every Day Smoker    Packs/day: 0.75    Years: 49.00    Pack years: 36.75    Types: Cigarettes  . Smokeless tobacco: Never Used  Substance Use Topics  . Alcohol use: No  . Drug use: No    Comment: Methadone for narcotic use     Allergies   Tylenol [acetaminophen]; Codeine; Nsaids; Tolmetin; and Tramadol   Review of Systems Review of Systems  Respiratory: Positive for shortness of breath.   Gastrointestinal: Positive for abdominal pain and nausea.  All other systems reviewed and are negative.    Physical Exam Updated Vital Signs BP 140/65   Pulse 70   Temp 97.9 F (36.6 C) (Oral)   Resp 13   Ht 5\' 4"  (1.626 m)   Wt 49.9 kg   SpO2 (!) 89%   BMI 18.88 kg/m   Physical Exam  Constitutional: She is oriented to person, place, and time. She appears well-developed. She appears distressed.  HENT:  Head: Normocephalic and atraumatic.  Mouth/Throat: Oropharynx is clear and moist.  Eyes:  Left orbit paralysis  Cardiovascular: Normal rate, regular rhythm, normal heart sounds and intact distal pulses.  Pulmonary/Chest: Effort normal and breath sounds normal.   Abdominal: Normal appearance and bowel sounds are normal. There is generalized tenderness.  Genitourinary: Uterus normal.  Neurological: She is alert and oriented to person, place, and time.  Skin: Skin is warm and dry. Capillary refill takes less than 2 seconds.  Psychiatric: Her mood appears anxious.  Tearful on exam  Nursing note and vitals reviewed.    ED Treatments / Results  Labs (all labs ordered are listed, but only abnormal results are displayed) Labs Reviewed  COMPREHENSIVE METABOLIC PANEL - Abnormal; Notable for the following components:  Result Value   Sodium 146 (*)    Glucose, Bld 101 (*)    All other components within normal limits  URINALYSIS, ROUTINE W REFLEX MICROSCOPIC - Abnormal; Notable for the following components:   APPearance HAZY (*)    Hgb urine dipstick SMALL (*)    Bacteria, UA RARE (*)    All other components within normal limits  LIPASE, BLOOD  CBC    EKG EKG Interpretation  Date/Time:  Sunday January 04 2018 11:34:11 EDT Ventricular Rate:  79 PR Interval:    QRS Duration: 96 QT Interval:  389 QTC Calculation: 446 R Axis:   -36 Text Interpretation:  Sinus rhythm Left axis deviation No significant change since last tracing Confirmed by Isla Pence [id number]) on 01/04/2018 1:22:39 PM   Radiology Dg Chest 2 View  Result Date: 01/04/2018 CLINICAL DATA:  67 year old female with shortness of breath and abdominal pain. EXAM: CHEST - 2 VIEW COMPARISON:  Chest radiographs 11/10/2017 and earlier. FINDINGS: Stable large lung volumes. Mediastinal contours are stable and within normal limits. Visualized tracheal air column is within normal limits. No pneumothorax, pulmonary edema, pleural effusion or confluent pulmonary opacity. Interstitial markings appear regressed since September. Osteopenia. No acute osseous abnormality identified. Negative visible bowel gas pattern. IMPRESSION: Chronic pulmonary hyperinflation. No acute cardiopulmonary  abnormality. Electronically Signed   By: Genevie Ann M.D.   On: 01/04/2018 13:21   Ct Abdomen Pelvis W Contrast  Result Date: 01/04/2018 CLINICAL DATA:  Patient is AOx4 and ambulatory. Patient arrived via POV. Patient is complaining of abd pain and SOB. Patient is crying and husband is at bedside. Patient stated that she has pancreatitis and lesions on liver.Patient has been hurting for 4 months EXAM: CT ABDOMEN AND PELVIS WITH CONTRAST TECHNIQUE: Multidetector CT imaging of the abdomen and pelvis was performed using the standard protocol following bolus administration of intravenous contrast. CONTRAST:  169mL ISOVUE-300 IOPAMIDOL (ISOVUE-300) INJECTION 61% COMPARISON:  02/09/2017 and older exams. FINDINGS: Lower chest: Lung bases essentially clear.  Heart normal in size. Hepatobiliary: Liver normal in overall size. No mass or focal lesion. There is intra and extrahepatic bile duct dilation, similar in severity to the previous enhanced CT dated 12/28/2016. Common bile duct measures 1 cm in diameter proximally. It decreases in caliber distally but then increases in the pancreatic head just above the ampulla. Duct measures a maximum of 9 mm in the pancreatic head. Status post cholecystectomy. Pancreas: Small, 8 mm, cystic mass in the superior pancreatic head stable for multiple years. No other pancreatic masses. Mild irregular prominence of the pancreatic duct. No other pancreatic masses. No inflammation. Spleen: Normal in size without focal abnormality. Adrenals/Urinary Tract: No adrenal masses. Kidneys normal in overall size, orientation and position with symmetric enhancement and excretion. Multiple small low-density renal masses consistent with cysts. Largest arises from the upper pole the left kidney, 2.8 cm in size, stable compared to the most recent prior study. No intrarenal stones. No hydronephrosis. Ureters normal in course and in caliber. Bladder is unremarkable. Stomach/Bowel: Stomach is unremarkable.  Bowel is normal in caliber. No wall thickening or inflammatory changes. Mild increased stool noted in the colon and rectum. Appendix not visualized.  No evidence of appendicitis. Vascular/Lymphatic: Aortic atherosclerosis. No enlarged lymph nodes. Reproductive: Status post hysterectomy. No adnexal masses. Other: No abdominal wall hernia or abnormality. No abdominopelvic ascites. Musculoskeletal: No acute fracture or acute finding. Advanced disc degenerative changes at L4-L5, stable. No osteoblastic or osteolytic lesions. IMPRESSION: 1. There is intra and extrahepatic bile  duct dilation similar in severity to the enhanced CT dated 12/28/2016. No liver masses or lesions. 2. No acute findings.  No evidence of pancreatitis. 3. Stable benign cystic lesion in the pancreatic head. 4. Multiple low-density renal lesions consistent with cysts, also stable. 5. Mild increased stool noted throughout the colon and in the rectum. No bowel obstruction or inflammation. 6. Aortic atherosclerosis. Electronically Signed   By: Lajean Manes M.D.   On: 01/04/2018 15:14    Procedures Procedures (including critical care time)  Medications Ordered in ED Medications  iopamidol (ISOVUE-300) 61 % injection (has no administration in time range)  sodium chloride 0.9 % injection (has no administration in time range)  sodium chloride 0.9 % bolus 1,000 mL (1,000 mLs Intravenous New Bag/Given 01/04/18 1255)  morphine 4 MG/ML injection 4 mg (4 mg Intravenous Given 01/04/18 1248)  ondansetron (ZOFRAN) injection 4 mg (4 mg Intravenous Given 01/04/18 1248)  LORazepam (ATIVAN) injection 1 mg (1 mg Intravenous Given 01/04/18 1248)  HYDROmorphone (DILAUDID) injection 2 mg (2 mg Intravenous Given 01/04/18 1337)  iopamidol (ISOVUE-300) 61 % injection 100 mL (100 mLs Intravenous Contrast Given 01/04/18 1434)     Initial Impression / Assessment and Plan / ED Course  I have reviewed the triage vital signs and the nursing notes.  Pertinent  labs & imaging results that were available during my care of the patient were reviewed by me and considered in my medical decision making (see chart for details).    Pt is feeling much better.  She does have a increased stool throughout the colon.  She will be d/c home with miralax.  She has seen Dr. Hilarie Fredrickson in the past.  She is instructed to f/u with him.  Return if worse.  Final Clinical Impressions(s) / ED Diagnoses   Final diagnoses:  Generalized abdominal pain  Constipation, unspecified constipation type  Anxiety    ED Discharge Orders         Ordered    polyethylene glycol (MIRALAX) packet  Daily     01/04/18 1524    ondansetron (ZOFRAN ODT) 4 MG disintegrating tablet  Every 8 hours PRN     01/04/18 1524           Isla Pence, MD 01/04/18 1536

## 2018-01-04 NOTE — ED Notes (Signed)
Discharge paperwork reviewed with patient, prescriptions described.  Pt was upset no pain medications were prescribed, stating "Im not constipated, I go 4 times a day."

## 2018-01-04 NOTE — ED Triage Notes (Signed)
Patient is AOx4 and ambulatory. Patient arrived via POV. Patient is complaining of abd pain and SOB. Patient is crying and husband is at bedside. Patient stated that she has pancreatitis and lesions on liver.

## 2018-01-04 NOTE — ED Notes (Signed)
Called to room by Rica Mote, RN to find patient without shirt on.  Pt had removed gown, tele monitor leads and removed own IV.  Pts pants were splattered with blood, pt tearful and shaking.  When asked if she was ok, she replied "i'm just weak."  Pt assisted with dressing, bleeding at IV site stopped and dressed with gauze.

## 2018-01-04 NOTE — ED Notes (Signed)
Patient transported to CT 

## 2018-01-05 DIAGNOSIS — A048 Other specified bacterial intestinal infections: Secondary | ICD-10-CM | POA: Diagnosis not present

## 2018-01-05 DIAGNOSIS — R1084 Generalized abdominal pain: Secondary | ICD-10-CM | POA: Diagnosis not present

## 2018-01-05 DIAGNOSIS — R0602 Shortness of breath: Secondary | ICD-10-CM | POA: Diagnosis not present

## 2018-01-30 DIAGNOSIS — R5383 Other fatigue: Secondary | ICD-10-CM | POA: Diagnosis not present

## 2018-01-30 DIAGNOSIS — R0602 Shortness of breath: Secondary | ICD-10-CM | POA: Diagnosis not present

## 2018-01-30 DIAGNOSIS — Z79899 Other long term (current) drug therapy: Secondary | ICD-10-CM | POA: Diagnosis not present

## 2018-01-30 DIAGNOSIS — E559 Vitamin D deficiency, unspecified: Secondary | ICD-10-CM | POA: Diagnosis not present

## 2018-01-30 DIAGNOSIS — Z0000 Encounter for general adult medical examination without abnormal findings: Secondary | ICD-10-CM | POA: Diagnosis not present

## 2018-01-30 DIAGNOSIS — G8929 Other chronic pain: Secondary | ICD-10-CM | POA: Diagnosis not present

## 2018-01-30 DIAGNOSIS — R1084 Generalized abdominal pain: Secondary | ICD-10-CM | POA: Diagnosis not present

## 2018-01-30 DIAGNOSIS — M542 Cervicalgia: Secondary | ICD-10-CM | POA: Diagnosis not present

## 2018-02-25 DIAGNOSIS — Z79899 Other long term (current) drug therapy: Secondary | ICD-10-CM | POA: Diagnosis not present

## 2018-03-09 DIAGNOSIS — J069 Acute upper respiratory infection, unspecified: Secondary | ICD-10-CM | POA: Diagnosis not present

## 2018-03-09 DIAGNOSIS — R05 Cough: Secondary | ICD-10-CM | POA: Diagnosis not present

## 2018-03-09 DIAGNOSIS — J209 Acute bronchitis, unspecified: Secondary | ICD-10-CM | POA: Diagnosis not present

## 2018-03-09 DIAGNOSIS — J449 Chronic obstructive pulmonary disease, unspecified: Secondary | ICD-10-CM | POA: Diagnosis not present

## 2018-03-09 DIAGNOSIS — J159 Unspecified bacterial pneumonia: Secondary | ICD-10-CM | POA: Diagnosis not present

## 2018-03-09 DIAGNOSIS — J029 Acute pharyngitis, unspecified: Secondary | ICD-10-CM | POA: Diagnosis not present

## 2018-03-17 DIAGNOSIS — J158 Pneumonia due to other specified bacteria: Secondary | ICD-10-CM | POA: Diagnosis not present

## 2018-03-24 DIAGNOSIS — J159 Unspecified bacterial pneumonia: Secondary | ICD-10-CM | POA: Diagnosis not present

## 2018-03-24 DIAGNOSIS — G4709 Other insomnia: Secondary | ICD-10-CM | POA: Diagnosis not present

## 2018-03-24 DIAGNOSIS — J449 Chronic obstructive pulmonary disease, unspecified: Secondary | ICD-10-CM | POA: Diagnosis not present

## 2018-03-24 DIAGNOSIS — F418 Other specified anxiety disorders: Secondary | ICD-10-CM | POA: Diagnosis not present

## 2018-04-22 DIAGNOSIS — Z79899 Other long term (current) drug therapy: Secondary | ICD-10-CM | POA: Diagnosis not present

## 2018-05-20 DIAGNOSIS — Z79899 Other long term (current) drug therapy: Secondary | ICD-10-CM | POA: Diagnosis not present

## 2018-06-03 IMAGING — CR DG ABD PORTABLE 1V
1 series · 1 of 1 positions shown · non-contrast
Comparison: 04/13/2016

CLINICAL DATA: Orogastric tube.  Patient on vent.

EXAM:
PORTABLE ABDOMEN - 1 VIEW

[AP]
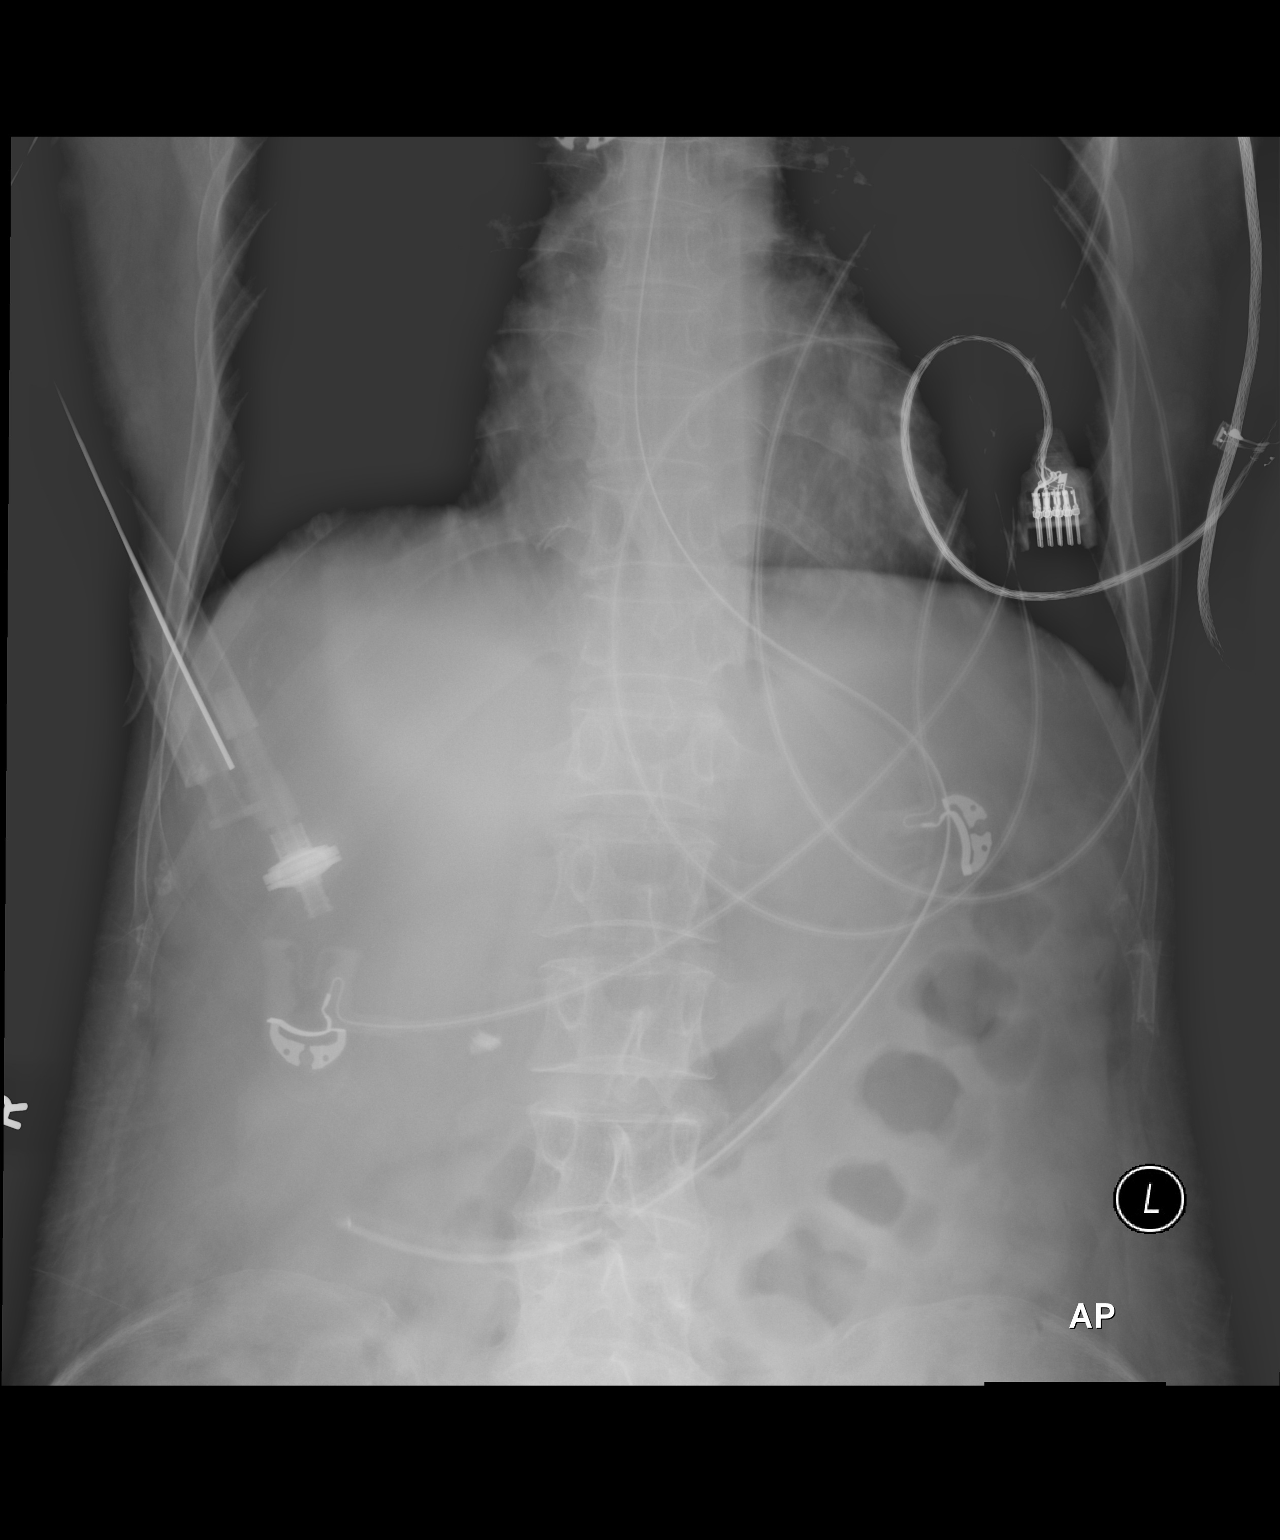

[1 of 1 positions shown; findings below may reference images not displayed]

FINDINGS: Orogastric tube has been advanced and terminates at the gastric
antrum or pylorus. Cholecystectomy clips. No gross free
intraperitoneal air. Nonobstructive bowel-gas pattern.
IMPRESSION: Orogastric tube terminating in the distal stomach or pylorus.

## 2018-08-06 DIAGNOSIS — Z79899 Other long term (current) drug therapy: Secondary | ICD-10-CM | POA: Diagnosis not present

## 2018-09-14 DIAGNOSIS — Z79899 Other long term (current) drug therapy: Secondary | ICD-10-CM | POA: Diagnosis not present

## 2018-10-12 DIAGNOSIS — Z79899 Other long term (current) drug therapy: Secondary | ICD-10-CM | POA: Diagnosis not present

## 2018-10-12 DIAGNOSIS — E7800 Pure hypercholesterolemia, unspecified: Secondary | ICD-10-CM | POA: Diagnosis not present

## 2018-10-12 DIAGNOSIS — R5383 Other fatigue: Secondary | ICD-10-CM | POA: Diagnosis not present

## 2018-10-12 DIAGNOSIS — E559 Vitamin D deficiency, unspecified: Secondary | ICD-10-CM | POA: Diagnosis not present

## 2018-10-12 DIAGNOSIS — D539 Nutritional anemia, unspecified: Secondary | ICD-10-CM | POA: Diagnosis not present

## 2018-10-12 DIAGNOSIS — Z1159 Encounter for screening for other viral diseases: Secondary | ICD-10-CM | POA: Diagnosis not present

## 2018-11-09 DIAGNOSIS — Z79899 Other long term (current) drug therapy: Secondary | ICD-10-CM | POA: Diagnosis not present

## 2018-11-09 DIAGNOSIS — G8929 Other chronic pain: Secondary | ICD-10-CM | POA: Diagnosis not present

## 2018-11-09 DIAGNOSIS — F329 Major depressive disorder, single episode, unspecified: Secondary | ICD-10-CM | POA: Diagnosis not present

## 2018-11-09 DIAGNOSIS — R1084 Generalized abdominal pain: Secondary | ICD-10-CM | POA: Diagnosis not present

## 2018-11-09 DIAGNOSIS — F419 Anxiety disorder, unspecified: Secondary | ICD-10-CM | POA: Diagnosis not present

## 2018-11-24 DIAGNOSIS — F419 Anxiety disorder, unspecified: Secondary | ICD-10-CM | POA: Diagnosis not present

## 2018-11-24 DIAGNOSIS — R159 Full incontinence of feces: Secondary | ICD-10-CM | POA: Diagnosis not present

## 2018-11-24 DIAGNOSIS — R1084 Generalized abdominal pain: Secondary | ICD-10-CM | POA: Diagnosis not present

## 2018-11-24 DIAGNOSIS — R1110 Vomiting, unspecified: Secondary | ICD-10-CM | POA: Diagnosis not present

## 2018-12-04 DIAGNOSIS — Z79899 Other long term (current) drug therapy: Secondary | ICD-10-CM | POA: Diagnosis not present

## 2018-12-04 DIAGNOSIS — R1084 Generalized abdominal pain: Secondary | ICD-10-CM | POA: Diagnosis not present

## 2018-12-04 DIAGNOSIS — F419 Anxiety disorder, unspecified: Secondary | ICD-10-CM | POA: Diagnosis not present

## 2018-12-04 DIAGNOSIS — F329 Major depressive disorder, single episode, unspecified: Secondary | ICD-10-CM | POA: Diagnosis not present

## 2018-12-04 DIAGNOSIS — G8929 Other chronic pain: Secondary | ICD-10-CM | POA: Diagnosis not present

## 2018-12-31 DIAGNOSIS — R1084 Generalized abdominal pain: Secondary | ICD-10-CM | POA: Diagnosis not present

## 2018-12-31 DIAGNOSIS — F419 Anxiety disorder, unspecified: Secondary | ICD-10-CM | POA: Diagnosis not present

## 2018-12-31 DIAGNOSIS — R110 Nausea: Secondary | ICD-10-CM | POA: Diagnosis not present

## 2018-12-31 DIAGNOSIS — G8929 Other chronic pain: Secondary | ICD-10-CM | POA: Diagnosis not present

## 2018-12-31 DIAGNOSIS — F329 Major depressive disorder, single episode, unspecified: Secondary | ICD-10-CM | POA: Diagnosis not present

## 2019-01-29 DIAGNOSIS — R1084 Generalized abdominal pain: Secondary | ICD-10-CM | POA: Diagnosis not present

## 2019-01-29 DIAGNOSIS — G8929 Other chronic pain: Secondary | ICD-10-CM | POA: Diagnosis not present

## 2019-01-29 DIAGNOSIS — F419 Anxiety disorder, unspecified: Secondary | ICD-10-CM | POA: Diagnosis not present

## 2019-01-29 DIAGNOSIS — F329 Major depressive disorder, single episode, unspecified: Secondary | ICD-10-CM | POA: Diagnosis not present

## 2019-05-06 ENCOUNTER — Encounter (HOSPITAL_COMMUNITY): Payer: Self-pay | Admitting: Emergency Medicine

## 2019-05-06 ENCOUNTER — Emergency Department (HOSPITAL_COMMUNITY)
Admission: EM | Admit: 2019-05-06 | Discharge: 2019-05-06 | Disposition: A | Discharge status: Home / Self Care | Payer: Medicare Other | Attending: Emergency Medicine | Admitting: Emergency Medicine

## 2019-05-06 ENCOUNTER — Emergency Department (HOSPITAL_COMMUNITY): Payer: Medicare Other

## 2019-05-06 DIAGNOSIS — K529 Noninfective gastroenteritis and colitis, unspecified: Secondary | ICD-10-CM

## 2019-05-06 DIAGNOSIS — F17210 Nicotine dependence, cigarettes, uncomplicated: Secondary | ICD-10-CM | POA: Insufficient documentation

## 2019-05-06 DIAGNOSIS — I10 Essential (primary) hypertension: Secondary | ICD-10-CM | POA: Diagnosis not present

## 2019-05-06 DIAGNOSIS — R1012 Left upper quadrant pain: Secondary | ICD-10-CM | POA: Diagnosis not present

## 2019-05-06 DIAGNOSIS — R1011 Right upper quadrant pain: Secondary | ICD-10-CM | POA: Insufficient documentation

## 2019-05-06 DIAGNOSIS — R5383 Other fatigue: Secondary | ICD-10-CM | POA: Diagnosis not present

## 2019-05-06 DIAGNOSIS — R112 Nausea with vomiting, unspecified: Secondary | ICD-10-CM | POA: Insufficient documentation

## 2019-05-06 DIAGNOSIS — R079 Chest pain, unspecified: Secondary | ICD-10-CM | POA: Diagnosis not present

## 2019-05-06 DIAGNOSIS — R1013 Epigastric pain: Secondary | ICD-10-CM | POA: Diagnosis present

## 2019-05-06 DIAGNOSIS — R109 Unspecified abdominal pain: Secondary | ICD-10-CM

## 2019-05-06 DIAGNOSIS — Z8673 Personal history of transient ischemic attack (TIA), and cerebral infarction without residual deficits: Secondary | ICD-10-CM | POA: Insufficient documentation

## 2019-05-06 DIAGNOSIS — E876 Hypokalemia: Secondary | ICD-10-CM | POA: Diagnosis not present

## 2019-05-06 DIAGNOSIS — Z79899 Other long term (current) drug therapy: Secondary | ICD-10-CM | POA: Insufficient documentation

## 2019-05-06 LAB — COMPREHENSIVE METABOLIC PANEL
ALT: 18 U/L (ref 0–44)
AST: 19 U/L (ref 15–41)
Albumin: 4.4 g/dL (ref 3.5–5.0)
Alkaline Phosphatase: 62 U/L (ref 38–126)
Anion gap: 13 (ref 5–15)
BUN: 22 mg/dL (ref 8–23)
CO2: 24 mmol/L (ref 22–32)
Calcium: 9.8 mg/dL (ref 8.9–10.3)
Chloride: 103 mmol/L (ref 98–111)
Creatinine, Ser: 1.01 mg/dL — ABNORMAL HIGH (ref 0.44–1.00)
GFR calc Af Amer: >60 mL/min (ref >60)
GFR calc non Af Amer: 58 mL/min — ABNORMAL LOW (ref >60)
Glucose, Bld: 129 mg/dL — ABNORMAL HIGH (ref 70–99)
Potassium: 2.9 mmol/L — ABNORMAL LOW (ref 3.5–5.1)
Sodium: 140 mmol/L (ref 135–145)
Total Bilirubin: 0.8 mg/dL (ref 0.3–1.2)
Total Protein: 7.3 g/dL (ref 6.5–8.1)

## 2019-05-06 LAB — CBC WITH DIFFERENTIAL/PLATELET
Abs Immature Granulocytes: 0.07 10*3/uL (ref 0.00–0.07)
Basophils Absolute: 0.1 10*3/uL (ref 0.0–0.1)
Basophils Relative: 1 %
Eosinophils Absolute: 0.1 10*3/uL (ref 0.0–0.5)
Eosinophils Relative: 1 %
HCT: 46.9 % — ABNORMAL HIGH (ref 36.0–46.0)
Hemoglobin: 16.1 g/dL — ABNORMAL HIGH (ref 12.0–15.0)
Immature Granulocytes: 0 %
Lymphocytes Relative: 23 %
Lymphs Abs: 3.7 10*3/uL (ref 0.7–4.0)
MCH: 31.5 pg (ref 26.0–34.0)
MCHC: 34.3 g/dL (ref 30.0–36.0)
MCV: 91.8 fL (ref 80.0–100.0)
Monocytes Absolute: 1 10*3/uL (ref 0.1–1.0)
Monocytes Relative: 7 %
Neutro Abs: 10.7 10*3/uL — ABNORMAL HIGH (ref 1.7–7.7)
Neutrophils Relative %: 68 %
Platelets: 408 10*3/uL — ABNORMAL HIGH (ref 150–400)
RBC: 5.11 MIL/uL (ref 3.87–5.11)
RDW: 13.7 % (ref 11.5–15.5)
WBC: 15.7 10*3/uL — ABNORMAL HIGH (ref 4.0–10.5)
nRBC: 0 % (ref 0.0–0.2)

## 2019-05-06 LAB — LACTIC ACID, PLASMA
Lactic Acid, Venous: 1.3 mmol/L (ref 0.5–1.9)
Lactic Acid, Venous: 1.1 mmol/L (ref 0.5–1.9)

## 2019-05-06 LAB — TROPONIN I (HIGH SENSITIVITY)
Troponin I (High Sensitivity): 6 ng/L (ref <18)
Troponin I (High Sensitivity): 5 ng/L (ref <18)

## 2019-05-06 LAB — LIPASE, BLOOD: Lipase: 52 U/L — ABNORMAL HIGH (ref 11–51)

## 2019-05-06 MED ORDER — ONDANSETRON HCL 4 MG PO TABS: 4.0000 mg | ORAL_TABLET | Freq: Three times a day (TID) | ORAL | 0 refills | Status: DC | PRN

## 2019-05-06 MED ORDER — CIPROFLOXACIN HCL 500 MG PO TABS: 500.0000 mg | ORAL_TABLET | Freq: Two times a day (BID) | ORAL | 0 refills | Status: AC

## 2019-05-06 MED ORDER — OXYCODONE HCL 5 MG PO TABS: 5.0000 mg | ORAL_TABLET | ORAL | 0 refills | Status: DC | PRN

## 2019-05-06 MED ORDER — METRONIDAZOLE 500 MG PO TABS: 500.0000 mg | ORAL_TABLET | Freq: Three times a day (TID) | ORAL | 0 refills | Status: AC

## 2019-05-06 MED ORDER — MORPHINE SULFATE (PF) 4 MG/ML IV SOLN
4.0000 mg | Freq: Once | INTRAVENOUS | Status: AC
  Administered 2019-05-06: 4 mg via INTRAVENOUS
  Filled 2019-05-06: qty 1

## 2019-05-06 MED ORDER — ONDANSETRON HCL 4 MG/2ML IJ SOLN
4.0000 mg | Freq: Once | INTRAMUSCULAR | Status: AC
  Administered 2019-05-06: 4 mg via INTRAVENOUS
  Filled 2019-05-06: qty 2

## 2019-05-06 MED ORDER — IOHEXOL 300 MG/ML  SOLN
100.0000 mL | Freq: Once | INTRAMUSCULAR | Status: AC | PRN
  Administered 2019-05-06: 100 mL via INTRAVENOUS

## 2019-05-06 MED ORDER — SODIUM CHLORIDE 0.9 % IV BOLUS
1000.0000 mL | Freq: Once | INTRAVENOUS | Status: AC
  Administered 2019-05-06: 1000 mL via INTRAVENOUS

## 2019-05-06 MED ORDER — POTASSIUM CHLORIDE CRYS ER 20 MEQ PO TBCR: 40.0000 meq | EXTENDED_RELEASE_TABLET | Freq: Once | ORAL | Status: DC

## 2019-05-06 NOTE — ED Provider Notes (Signed)
[facility]ERGENCY DEPARTMENT Provider Note   CSN: [id number] Arrival date & time: 05/06/19  0901     History No chief complaint on file.   [name] is a 69 y.o. female.  The history is provided by the patient and medical records. No language interpreter was used.  Abdominal Pain Pain location:  Epigastric, RUQ, R flank and LUQ Pain quality: aching and cramping   Pain radiates to:  Back Pain severity:  Severe Onset quality:  Gradual Duration:  1 week Timing:  Constant Progression:  Waxing and waning Chronicity:  Recurrent Context: not recent illness and not trauma   Relieved by:  Nothing Worsened by:  Nothing Associated symptoms: chest pain (very low CP near epigastrum), chills, fatigue, nausea and vomiting   Associated symptoms: no constipation, no cough, no diarrhea, no dysuria, no fever, no flatus, no shortness of breath, no vaginal bleeding and no vaginal discharge        Past Medical History:  Diagnosis Date  . Acute blood loss anemia   . Arrhythmia   . Arthritis   . C. difficile colitis   . Cancer ([place])   . Diverticulosis   . Fibromyalgia   . GERD (gastroesophageal reflux disease)   . GI hemorrhage   . H/O: substance abuse ([place]) 2005   narcotic usage due to chronic back pain  . Heart murmur   . Hypertension   . Pancreatic cyst   . Pneumococcal meningitis   . PONV (postoperative nausea and vomiting)   . Renal cyst   . Stroke (cerebrum) [facility])     Patient Active Problem List   Diagnosis Date Noted  . COPD pfts pending  06/20/2017  . DOE (dyspnea on exertion) 06/19/2017  . Cervical dystonia 11/27/2016  . C. difficile colitis 09/10/2016  . Abdominal pain 09/08/2016  . Acute encephalopathy   . Fever   . Left hemiparesis ([place])   . Generalized OA   . Fibromyalgia   . Tobacco abuse   . Substance abuse ([place])   . Benign essential HTN   . Tachycardia   . Chronic pain syndrome   . Acute blood loss anemia   . Leukocytosis     . Septic thrombophlebitis of sagittal sinus   . Acute respiratory failure ([place])   . Streptococcus pneumoniae meningitis   . Streptococcal bacteremia   . Meningitis   . Cerebral embolism with cerebral infarction 04/12/2016  . Stroke (cerebrum) ([place]) 04/12/2016  . Neck pain 04/04/2016  . Chronic abdominal pain 02/19/2016  . Essential hypertension 02/19/2016  . Bacterial conjunctivitis 02/19/2016  . Health care maintenance 02/19/2016  . ANXIETY 05/08/2006  . Cigarette smoker 05/08/2006  . ASTHMA, UNSPECIFIED 05/08/2006  . GASTROESOPHAGEAL REFLUX, NO ESOPHAGITIS 05/08/2006  . CONSTIPATION 05/08/2006  . CONVULSIONS, SEIZURES, NOS 05/08/2006    Past Surgical History:  Procedure Laterality Date  . ABDOMINAL HYSTERECTOMY    . ABDOMINAL SURGERY    . APPENDECTOMY    . BREAST SURGERY    . CHOLECYSTECTOMY N/A 09/30/2012   Procedure: LAPAROSCOPIC CHOLECYSTECTOMY WITH INTRAOPERATIVE CHOLANGIOGRAM;  Surgeon: Imogene Burn. Georgette Dover, MD;  Location: [facility] ORS;  Service: General;  Laterality: N/A;  . FRACTURE SURGERY     right upper arm  . OVARIAN CYST SURGERY       OB History   No obstetric history on file.     Family History  Problem Relation Age of Onset  . Alzheimer's disease Mother   . Heart disease Father   . Prostate  cancer Father     Social History   Tobacco Use  . Smoking status: Current Every Day Smoker    Packs/day: 0.75    Years: 49.00    Pack years: 36.75    Types: Cigarettes  . Smokeless tobacco: Never Used  Substance Use Topics  . Alcohol use: No  . Drug use: No    Comment: Methadone for narcotic use    Home Medications Prior to Admission medications   Medication Sig Start Date End Date Taking? Authorizing Provider  budesonide-formoterol (SYMBICORT) 80-4.5 MCG/ACT inhaler Take 2 puffs first thing in am and then another 2 puffs about 12 hours later. 06/19/17   Tanda Rockers, MD  Carboxymethylcellulose Sodium (EYE DROPS OP) Place 1 drop into both eyes daily as  needed (DRY EYES).    [provider]  carisoprodol (SOMA) 350 MG tablet TAKE 1 TABLET BY MOUTH UP TO THREE TIMES DAILY AS NEEDED FOR MUSCLE SPASMS Patient taking differently: Take 350 mg by mouth 3 (three) times daily.  03/19/17   [name], MD  carvedilol (COREG) 6.25 MG tablet Take 6.25 mg by mouth 3 (three) times daily.  12/25/17   [provider]  clonazePAM (KLONOPIN) 0.5 MG tablet Take 0.5 mg by mouth 4 (four) times daily as needed for anxiety.  12/02/17   [provider]  doxepin (SINEQUAN) 100 MG capsule Take 100 mg by mouth at bedtime.  11/02/17   [provider]  fluconazole (DIFLUCAN) 100 MG tablet Take 1 tablet (100 mg total) by mouth daily. Take 2 tablets (200mg ) day one.  Take one tablet(100mg ) for 13 days. 06/06/17   Nils Flack, Mina A, PA-C  ondansetron (ZOFRAN ODT) 4 MG disintegrating tablet Take 1 tablet (4 mg total) by mouth every 8 (eight) hours as needed. 01/04/18   Isla Pence, MD  ondansetron (ZOFRAN) 4 MG tablet Take 1 tablet (4 mg total) by mouth every 6 (six) hours as needed for nausea or vomiting. 02/25/17   Pyrtle, Lajuan Lines, MD  pantoprazole (PROTONIX) 40 MG tablet Take 1 tablet (40 mg total) by mouth daily. 04/01/17   Pyrtle, Lajuan Lines, MD  polyethylene glycol [facility]) packet Take 17 g by mouth daily. 01/04/18   Isla Pence, MD  temazepam (RESTORIL) 15 MG capsule Take 15 mg by mouth at bedtime.  12/02/17   [provider]    Allergies    Tylenol [acetaminophen], Codeine, Nsaids, Tolmetin, and Tramadol  Review of Systems   Review of Systems  Constitutional: Positive for chills and fatigue. Negative for diaphoresis and fever.  HENT: Negative for congestion.   Respiratory: Negative for cough, chest tightness, shortness of breath, wheezing and stridor.   Cardiovascular: Positive for chest pain (very low CP near epigastrum). Negative for palpitations and leg swelling.  Gastrointestinal: Positive for abdominal pain, nausea and  vomiting. Negative for abdominal distention, constipation, diarrhea and flatus.  Genitourinary: Positive for flank pain. Negative for dysuria, frequency, vaginal bleeding and vaginal discharge.  Musculoskeletal: Positive for back pain. Negative for neck pain and neck stiffness.  Skin: Negative for rash.  Neurological: Positive for light-headedness. Negative for dizziness, weakness and headaches.  Psychiatric/Behavioral: Negative for agitation.  All other systems reviewed and are negative.   Physical Exam Updated Vital Signs BP (!) 147/92   Pulse 83   Temp 98.5 F (36.9 C) (Oral)   Resp 16   SpO2 98%   Physical Exam Vitals and nursing note reviewed.  Constitutional:      General: She is  not in acute distress.    Appearance: She is well-developed. She is not ill-appearing, toxic-appearing or diaphoretic.  HENT:     Head: Normocephalic and atraumatic.     Nose: No congestion or rhinorrhea.     Mouth/Throat:     Mouth: Mucous membranes are moist.     Pharynx: No oropharyngeal exudate or posterior oropharyngeal erythema.  Eyes:     Extraocular Movements:     Right eye: Normal extraocular motion.     Left eye: Abnormal extraocular motion present.     Conjunctiva/sclera: Conjunctivae normal.     Pupils: Pupils are equal, round, and reactive to light.     Comments: Left extraocular movements are abnormal and she reports unchanged from baseline.  Pupils are symmetric and reactive however.  Cardiovascular:     Rate and Rhythm: Normal rate and regular rhythm.     Pulses: Normal pulses.     Heart sounds: No murmur.  Pulmonary:     Effort: Pulmonary effort is normal. No respiratory distress.     Breath sounds: Normal breath sounds. No wheezing, rhonchi or rales.  Chest:     Chest wall: No tenderness.  Abdominal:     General: Abdomen is flat. Bowel sounds are normal. There is no distension.     Palpations: Abdomen is soft.     Tenderness: There is abdominal tenderness in the right  upper quadrant, epigastric area and left upper quadrant. There is right CVA tenderness. There is no left CVA tenderness, guarding or rebound.    Musculoskeletal:        General: Tenderness present.     Cervical back: Neck supple. No tenderness.       Back:  Skin:    General: Skin is warm and dry.     Capillary Refill: Capillary refill takes less than 2 seconds.  Neurological:     Mental Status: She is alert. Mental status is at baseline.     GCS: GCS eye subscore is 4. GCS verbal subscore is 5. GCS motor subscore is 6.     Cranial Nerves: No dysarthria or facial asymmetry.     Sensory: No sensory deficit.     Motor: Weakness present. No tremor or seizure activity.     Comments: Subtle weakness in left arm and left leg unchanged from prior by patient report due to to prior stroke.  Psychiatric:        Mood and Affect: Mood normal.     ED Results / Procedures / Treatments   Labs (all labs ordered are listed, but only abnormal results are displayed) Labs Reviewed  CBC WITH DIFFERENTIAL/PLATELET - Abnormal; Notable for the following components:      Result Value   WBC 15.7 (*)    Hemoglobin 16.1 (*)    HCT 46.9 (*)    Platelets 408 (*)    Neutro Abs 10.7 (*)    All other components within normal limits  COMPREHENSIVE METABOLIC PANEL - Abnormal; Notable for the following components:   Potassium 2.9 (*)    Glucose, Bld 129 (*)    Creatinine, Ser 1.01 (*)    GFR calc non Af Amer 58 (*)    All other components within normal limits  LIPASE, BLOOD - Abnormal; Notable for the following components:   Lipase 52 (*)    All other components within normal limits  URINE CULTURE  LACTIC ACID, PLASMA  LACTIC ACID, PLASMA  URINALYSIS, ROUTINE W REFLEX MICROSCOPIC  TROPONIN I (HIGH SENSITIVITY)  TROPONIN I (HIGH SENSITIVITY)    EKG EKG Interpretation  Date/Time:  Thursday May 06 2019 09:22:08 EST Ventricular Rate:  79 PR Interval:    QRS Duration: 101 QT Interval:  398 QTC  Calculation: 457 R Axis:   -48 Text Interpretation: Sinus rhythm Right atrial enlargement Left axis deviation When comapred to prior no significant cahnges seen. No STEMI Confirmed by Antony Blackbird [phone]) on 05/06/2019 9:26:36 AM   Radiology CT ABDOMEN PELVIS W CONTRAST  Result Date: 05/06/2019 CLINICAL DATA:  Right flank and abdominal pain. Nausea and vomiting. EXAM: CT ABDOMEN AND PELVIS WITH CONTRAST TECHNIQUE: Multidetector CT imaging of the abdomen and pelvis was performed using the standard protocol following bolus administration of intravenous contrast. CONTRAST:  141mL OMNIPAQUE IOHEXOL 300 MG/ML  SOLN COMPARISON:  Most recent CT 01/04/2018, abdominal MRI 09/09/2016 FINDINGS: Lower chest: No acute findings. No pleural fluid or focal airspace disease. Hepatobiliary: Chronic intra and extrahepatic biliary ductal dilatation. This appears similar to prior exam. Postcholecystectomy. No visualized choledocholithiasis. No focal hepatic lesion. Pancreas: Cystic lesion in the pancreatic tail measuring 5 mm may communicate with the pancreatic duct. 6 x 8 mm cyst in the pancreatic head, not significantly changed. There is additional 4 mm low-density lesion in the pancreatic body. Punctate calcification in the pancreatic head and tail. No acute peripancreatic inflammation. Spleen: Normal in size without focal abnormality. Adrenals/Urinary Tract: No adrenal nodule. No hydronephrosis or perinephric edema. Homogeneous renal enhancement with symmetric excretion on delayed phase imaging. Low-density lesions in both kidneys most consistent with cysts, including clustered cysts in the left upper pole. Urinary bladder is physiologically distended without wall thickening. Stomach/Bowel: Bowel evaluation is limited in the absence of contrast and paucity of intra-abdominal fat. Stomach is nondistended, further limiting evaluation. Normal positioning of the ligament of Treitz. No small bowel obstruction. No obvious small  bowel inflammation. Appendix not visualized, surgically absent per history. Difficult to exclude wall thickening of the descending colon, no significant pericolonic edema. Minimal diverticulosis of the sigmoid without diverticulitis. Vascular/Lymphatic: Aortic atherosclerosis without aneurysm. The portal and splenic veins are patent. Circumaortic left renal vein. No adenopathy. Reproductive: Status post hysterectomy. No adnexal masses. Other: No free air, free fluid, or intra-abdominal fluid collection. Tiny fat containing umbilical hernia. Musculoskeletal: Chronic disc space loss at L4-L5. Bones are diffusely under mineralized. There are no acute or suspicious osseous abnormalities. IMPRESSION: 1. Possible wall thickening of the descending colon, lack of contrast as well as paucity of intra-abdominal fat limits assessment. No significant pericolonic edema. This may be due to nondistention or mild colitis. 2. Otherwise no acute abnormality in the abdomen/pelvis. 3. Unchanged biliary ductal dilatation postcholecystectomy. 4. Pancreatic cystic lesions, largest measuring 8 mm, not significantly changed from prior exams. One of these lesions in the pancreatic body/tail may communicate with the pancreatic duct. Given history of chronic pancreatitis, favor chronic pseudocysts, however IPMN not entirely excluded. Recommend follow-up pancreatic protocol MRI in 2 years. This recommendation follows ACR consensus guidelines: Management of Incidental Pancreatic Cysts: A White Paper of the ACR Incidental Findings Committee. [place] [id number]. Aortic Atherosclerosis (ICD10-I70.0). Electronically Signed   By: Keith Rake M.D.   On: 05/06/2019 14:08    Procedures Procedures (including critical care time)  Medications Ordered in ED Medications  potassium chloride SA (KLOR-CON) CR tablet 40 mEq (has no administration in time range)  sodium chloride 0.9 % bolus 1,000 mL (0 mLs Intravenous Stopped 05/06/19  1205)  morphine 4 MG/ML injection 4 mg (4 mg Intravenous Given  05/06/19 0940)  ondansetron (ZOFRAN) injection 4 mg (4 mg Intravenous Given 05/06/19 0940)  morphine 4 MG/ML injection 4 mg (4 mg Intravenous Given 05/06/19 1203)  iohexol (OMNIPAQUE) 300 MG/ML solution 100 mL (100 mLs Intravenous Contrast Given 05/06/19 1347)    ED Course  I have reviewed the triage vital signs and the nursing notes.  Pertinent labs & imaging results that were available during my care of the patient were reviewed by me and considered in my medical decision making (see chart for details).    MDM Rules/Calculators/A&P                      [name] is a 69 y.o. female with a past medical history significant for hypertension, asthma, GERD with prior GI bleed, asthma, prior stroke with residual left-sided weakness, chronic paralysis of left Eye, prior meningitis, renal cysts, pancreatic cysts, prior pancreatitis, and substance abuse who presents with upper abdominal pain, right flank pain, nausea, vomiting, and dehydration.  Patient reports that for the last week she has had symptoms that feels similar to prior pancreatitis.  She reports that her pain is all across her upper abdomen and goes around the right side towards her flank.  She reports she does not think she has history of kidney stones but does have a history of renal cysts.  She denies any urinary symptoms.  She denies constipation or diarrhea and is still passing gas.  She denies abdominal distention.  She reports last week she has had nausea and vomiting has had very little to eat or drink due to this.  She is not able to take her medications she normally takes due to the symptoms.  She reports no blood in her emesis or stools.  She denies documented fevers at home but does report some chills.  She denies significant cough, shortness of breath, or palpitations.  Does report some very lower chest pain near her abdomen.  She denies any trauma.  She denies any  other complaints on arrival.  On exam, lungs are clear and chest is nontender.  Upper abdomen is tender to palpation mildly.  Her right flank is tender.  No rash seen to indicate shingles.  Good pulses in lower extremities and no lower extremity tenderness or edema seen.  Patient otherwise resting comfortably with nausea.  Given patient's report of similar symptoms, I suspect she is having recurrent pancreatitis causing the abdominal discomfort.    We had a shared decision-making conversation and agreed to get labs, rehydrate her, and give her nausea and pain medicine initially.  We agreed to hold on CT imaging as this feels very similar to prior.  If her symptoms do not improve or significant rales are discovered on the work-up, we did agree to consider imaging to rule out stone or if lipase is significantly elevated and pain is unimproved, pancreatitis with necrosis.  Patient is agreeable this plan.  Anticipate reassessment.  CT scan showed evidence of colitis.  Patient will be given prescription for Cipro and Flagyl as well as some pain medicine and nausea medicine.  Due to significant narcotic history, will only give several pills at this time.  She will stay hydrated at home and follow-up with her PCP.  She is agreeable to this plan.  Patient had no other questions or concerns and was discharged in good condition.   Final Clinical Impression(s) / ED Diagnoses Final diagnoses:  Colitis  Non-intractable vomiting with nausea, unspecified vomiting type  Abdominal  pain, unspecified abdominal location  Hypokalemia    Rx / DC Orders ED Discharge Orders         Ordered    ciprofloxacin (CIPRO) 500 MG tablet  Every 12 hours     05/06/19 1527    metroNIDAZOLE (FLAGYL) 500 MG tablet  3 times daily     05/06/19 1527    ondansetron (ZOFRAN) 4 MG tablet  Every 8 hours PRN     05/06/19 1527    oxyCODONE (ROXICODONE) 5 MG immediate release tablet  Every 4 hours PRN     05/06/19 1527          Clinical Impression: 1. Colitis   2. Non-intractable vomiting with nausea, unspecified vomiting type   3. Abdominal pain, unspecified abdominal location   4. Hypokalemia     Disposition: Discharge  Condition: Good  I have discussed the results, Dx and Tx plan with the pt(& family if present). He/she/they expressed understanding and agree(s) with the plan. Discharge instructions discussed at great length. Strict return precautions discussed and pt &/or family have verbalized understanding of the instructions. No further questions at time of discharge.    Discharge Medication List as of 05/06/2019  3:33 PM    START taking these medications   Details  ciprofloxacin (CIPRO) 500 MG tablet Take 1 tablet (500 mg total) by mouth every 12 (twelve) hours for 7 days., Starting Thu 05/06/2019, Until Thu 05/13/2019, Normal    metroNIDAZOLE (FLAGYL) 500 MG tablet Take 1 tablet (500 mg total) by mouth 3 (three) times daily for 7 days., Starting Thu 05/06/2019, Until Thu 05/13/2019, Normal    !! ondansetron (ZOFRAN) 4 MG tablet Take 1 tablet (4 mg total) by mouth every 8 (eight) hours as needed., Starting Thu 05/06/2019, Normal    oxyCODONE (ROXICODONE) 5 MG immediate release tablet Take 1 tablet (5 mg total) by mouth every 4 (four) hours as needed for severe pain., Starting Thu 05/06/2019, Normal     !! - Potential duplicate medications found. Please discuss with provider.      Follow Up: [name], MD [place] [id number] [place] [address] [id number] mc [place]       Jessi Jessop, Gwenyth Allegra, MD 05/06/19 [phone]

## 2019-05-06 NOTE — ED Triage Notes (Signed)
Pt here from home with c/o right side flank and abd pain , pt has chronic pancreatitis and has been feeling weak for about 1 week

## 2019-05-06 NOTE — Discharge Instructions (Signed)
Your work-up today showed evidence of colitis is a likely cause of your abdominal pain, nausea, vomiting, this led to your potassium being low which we repleted orally.  Please use the pain medicine, nausea medicine, and antibiotics to treat the colitis.  Please follow-up with your primary doctor and stay hydrated.  If any symptoms change or worsen, please return to the nearest emergency department.

## 2019-05-06 NOTE — ED Notes (Signed)
Pt's husband called for an update but pt would prefer to update her husband herself at this time. Offered to call husband if pt wishes.

## 2019-05-07 LAB — URINE CULTURE

## 2020-04-20 ENCOUNTER — Emergency Department (HOSPITAL_COMMUNITY): Payer: Medicare Other

## 2020-04-20 ENCOUNTER — Other Ambulatory Visit: Payer: Self-pay

## 2020-04-20 ENCOUNTER — Inpatient Hospital Stay (HOSPITAL_COMMUNITY)
Admission: EM | Admit: 2020-04-20 | Discharge: 2020-04-22 | DRG: 190 | Disposition: A | Discharge status: Home / Self Care | Payer: Medicare Other | Attending: Internal Medicine | Admitting: Internal Medicine

## 2020-04-20 ENCOUNTER — Encounter (HOSPITAL_COMMUNITY): Payer: Self-pay

## 2020-04-20 DIAGNOSIS — I5032 Chronic diastolic (congestive) heart failure: Secondary | ICD-10-CM | POA: Diagnosis present

## 2020-04-20 DIAGNOSIS — Z20822 Contact with and (suspected) exposure to covid-19: Secondary | ICD-10-CM | POA: Diagnosis present

## 2020-04-20 DIAGNOSIS — I110 Hypertensive heart disease with heart failure: Secondary | ICD-10-CM | POA: Diagnosis present

## 2020-04-20 DIAGNOSIS — Z79899 Other long term (current) drug therapy: Secondary | ICD-10-CM

## 2020-04-20 DIAGNOSIS — D6489 Other specified anemias: Secondary | ICD-10-CM | POA: Diagnosis present

## 2020-04-20 DIAGNOSIS — K219 Gastro-esophageal reflux disease without esophagitis: Secondary | ICD-10-CM | POA: Diagnosis present

## 2020-04-20 DIAGNOSIS — F17210 Nicotine dependence, cigarettes, uncomplicated: Secondary | ICD-10-CM | POA: Diagnosis present

## 2020-04-20 DIAGNOSIS — R0602 Shortness of breath: Secondary | ICD-10-CM | POA: Diagnosis present

## 2020-04-20 DIAGNOSIS — R627 Adult failure to thrive: Secondary | ICD-10-CM | POA: Diagnosis present

## 2020-04-20 DIAGNOSIS — Z90710 Acquired absence of both cervix and uterus: Secondary | ICD-10-CM | POA: Diagnosis not present

## 2020-04-20 DIAGNOSIS — G894 Chronic pain syndrome: Secondary | ICD-10-CM | POA: Diagnosis present

## 2020-04-20 DIAGNOSIS — J441 Chronic obstructive pulmonary disease with (acute) exacerbation: Secondary | ICD-10-CM

## 2020-04-20 DIAGNOSIS — M797 Fibromyalgia: Secondary | ICD-10-CM | POA: Diagnosis present

## 2020-04-20 DIAGNOSIS — F419 Anxiety disorder, unspecified: Secondary | ICD-10-CM | POA: Diagnosis present

## 2020-04-20 DIAGNOSIS — K3189 Other diseases of stomach and duodenum: Secondary | ICD-10-CM | POA: Diagnosis present

## 2020-04-20 DIAGNOSIS — R0902 Hypoxemia: Secondary | ICD-10-CM

## 2020-04-20 DIAGNOSIS — K861 Other chronic pancreatitis: Secondary | ICD-10-CM | POA: Diagnosis present

## 2020-04-20 DIAGNOSIS — J9600 Acute respiratory failure, unspecified whether with hypoxia or hypercapnia: Secondary | ICD-10-CM | POA: Diagnosis present

## 2020-04-20 DIAGNOSIS — R9431 Abnormal electrocardiogram [ECG] [EKG]: Secondary | ICD-10-CM | POA: Diagnosis not present

## 2020-04-20 DIAGNOSIS — R112 Nausea with vomiting, unspecified: Secondary | ICD-10-CM

## 2020-04-20 DIAGNOSIS — K862 Cyst of pancreas: Secondary | ICD-10-CM | POA: Diagnosis present

## 2020-04-20 DIAGNOSIS — J9601 Acute respiratory failure with hypoxia: Secondary | ICD-10-CM | POA: Diagnosis present

## 2020-04-20 DIAGNOSIS — Z66 Do not resuscitate: Secondary | ICD-10-CM | POA: Diagnosis present

## 2020-04-20 DIAGNOSIS — Z8673 Personal history of transient ischemic attack (TIA), and cerebral infarction without residual deficits: Secondary | ICD-10-CM | POA: Diagnosis not present

## 2020-04-20 DIAGNOSIS — K2970 Gastritis, unspecified, without bleeding: Secondary | ICD-10-CM | POA: Diagnosis not present

## 2020-04-20 DIAGNOSIS — Z9049 Acquired absence of other specified parts of digestive tract: Secondary | ICD-10-CM | POA: Diagnosis not present

## 2020-04-20 DIAGNOSIS — I10 Essential (primary) hypertension: Secondary | ICD-10-CM | POA: Diagnosis not present

## 2020-04-20 LAB — COMPREHENSIVE METABOLIC PANEL
ALT: 15 U/L (ref 0–44)
AST: 21 U/L (ref 15–41)
Albumin: 4.8 g/dL (ref 3.5–5.0)
Alkaline Phosphatase: 85 U/L (ref 38–126)
Anion gap: 13 (ref 5–15)
BUN: 19 mg/dL (ref 8–23)
CO2: 27 mmol/L (ref 22–32)
Calcium: 10.3 mg/dL (ref 8.9–10.3)
Chloride: 101 mmol/L (ref 98–111)
Creatinine, Ser: 0.61 mg/dL (ref 0.44–1.00)
GFR, Estimated: >60 mL/min (ref >60)
Glucose, Bld: 141 mg/dL — ABNORMAL HIGH (ref 70–99)
Potassium: 4 mmol/L (ref 3.5–5.1)
Sodium: 141 mmol/L (ref 135–145)
Total Bilirubin: 0.7 mg/dL (ref 0.3–1.2)
Total Protein: 8.2 g/dL — ABNORMAL HIGH (ref 6.5–8.1)

## 2020-04-20 LAB — CBC
HCT: 46.1 % — ABNORMAL HIGH (ref 36.0–46.0)
Hemoglobin: 15.8 g/dL — ABNORMAL HIGH (ref 12.0–15.0)
MCH: 30.7 pg (ref 26.0–34.0)
MCHC: 34.3 g/dL (ref 30.0–36.0)
MCV: 89.7 fL (ref 80.0–100.0)
Platelets: 361 10*3/uL (ref 150–400)
RBC: 5.14 MIL/uL — ABNORMAL HIGH (ref 3.87–5.11)
RDW: 13.2 % (ref 11.5–15.5)
WBC: 13.5 10*3/uL — ABNORMAL HIGH (ref 4.0–10.5)
nRBC: 0 % (ref 0.0–0.2)

## 2020-04-20 LAB — TROPONIN I (HIGH SENSITIVITY)
Troponin I (High Sensitivity): 5 ng/L (ref <18)
Troponin I (High Sensitivity): 5 ng/L (ref <18)

## 2020-04-20 LAB — SARS CORONAVIRUS 2 (TAT 6-24 HRS): SARS Coronavirus 2: NEGATIVE

## 2020-04-20 LAB — BLOOD GAS, VENOUS
Acid-Base Excess: 2.3 mmol/L — ABNORMAL HIGH (ref 0.0–2.0)
Bicarbonate: 28.4 mmol/L — ABNORMAL HIGH (ref 20.0–28.0)
O2 Saturation: 82.4 %
Patient temperature: 98.6
pCO2, Ven: 52.7 mmHg (ref 44.0–60.0)
pH, Ven: 7.351 (ref 7.250–7.430)
pO2, Ven: 51.1 mmHg — ABNORMAL HIGH (ref 32.0–45.0)

## 2020-04-20 LAB — LACTIC ACID, PLASMA: Lactic Acid, Venous: 1.2 mmol/L (ref 0.5–1.9)

## 2020-04-20 LAB — D-DIMER, QUANTITATIVE: D-Dimer, Quant: <0.27 ug/mL-FEU (ref 0.00–0.50)

## 2020-04-20 LAB — PROCALCITONIN: Procalcitonin: <0.1 ng/mL

## 2020-04-20 LAB — HIV ANTIBODY (ROUTINE TESTING W REFLEX): HIV Screen 4th Generation wRfx: NONREACTIVE

## 2020-04-20 LAB — LIPASE, BLOOD: Lipase: 30 U/L (ref 11–51)

## 2020-04-20 LAB — BRAIN NATRIURETIC PEPTIDE: B Natriuretic Peptide: 82.6 pg/mL (ref 0.0–100.0)

## 2020-04-20 MED ORDER — ALBUTEROL SULFATE (2.5 MG/3ML) 0.083% IN NEBU: 5.0000 mg | INHALATION_SOLUTION | Freq: Once | RESPIRATORY_TRACT | Status: DC

## 2020-04-20 MED ORDER — PREDNISONE 20 MG PO TABS
40.0000 mg | ORAL_TABLET | Freq: Every day | ORAL | Status: DC
  Administered 2020-04-22: 40 mg via ORAL
  Filled 2020-04-20: qty 2

## 2020-04-20 MED ORDER — ONDANSETRON HCL 4 MG/2ML IJ SOLN
4.0000 mg | Freq: Once | INTRAMUSCULAR | Status: DC | PRN
  Administered 2020-04-20: 4 mg via INTRAVENOUS
  Filled 2020-04-20: qty 2

## 2020-04-20 MED ORDER — IPRATROPIUM BROMIDE 0.02 % IN SOLN: 0.5000 mg | Freq: Once | RESPIRATORY_TRACT | Status: DC

## 2020-04-20 MED ORDER — METHYLPREDNISOLONE SODIUM SUCC 40 MG IJ SOLR
40.0000 mg | Freq: Four times a day (QID) | INTRAMUSCULAR | Status: AC
Start: 2020-04-20 — End: 2020-04-21
  Administered 2020-04-20 – 2020-04-21 (×4): 40 mg via INTRAVENOUS
  Filled 2020-04-20 (×4): qty 1

## 2020-04-20 MED ORDER — SODIUM CHLORIDE 0.9 % IV SOLN
100.0000 mg | Freq: Two times a day (BID) | INTRAVENOUS | Status: DC
  Administered 2020-04-20 – 2020-04-21 (×2): 100 mg via INTRAVENOUS
  Filled 2020-04-20 (×4): qty 100

## 2020-04-20 MED ORDER — LORAZEPAM 2 MG/ML IJ SOLN
0.5000 mg | Freq: Three times a day (TID) | INTRAMUSCULAR | Status: DC
  Administered 2020-04-20 (×2): 0.5 mg via INTRAVENOUS
  Filled 2020-04-20 (×2): qty 1

## 2020-04-20 MED ORDER — MOMETASONE FURO-FORMOTEROL FUM 100-5 MCG/ACT IN AERO
2.0000 | INHALATION_SPRAY | Freq: Two times a day (BID) | RESPIRATORY_TRACT | Status: DC
  Administered 2020-04-20 – 2020-04-21 (×3): 2 via RESPIRATORY_TRACT
  Filled 2020-04-20: qty 8.8

## 2020-04-20 MED ORDER — KETOROLAC TROMETHAMINE 15 MG/ML IJ SOLN
15.0000 mg | Freq: Once | INTRAMUSCULAR | Status: DC
  Filled 2020-04-20: qty 1

## 2020-04-20 MED ORDER — MORPHINE SULFATE (PF) 4 MG/ML IV SOLN: 6.0000 mg | Freq: Once | INTRAVENOUS | Status: DC

## 2020-04-20 MED ORDER — LACTATED RINGERS IV BOLUS
1000.0000 mL | Freq: Once | INTRAVENOUS | Status: AC
  Administered 2020-04-20: 1000 mL via INTRAVENOUS

## 2020-04-20 MED ORDER — CARVEDILOL 6.25 MG PO TABS
6.2500 mg | ORAL_TABLET | Freq: Three times a day (TID) | ORAL | Status: DC
  Administered 2020-04-20 – 2020-04-22 (×6): 6.25 mg via ORAL
  Filled 2020-04-20 (×5): qty 2
  Filled 2020-04-20: qty 1

## 2020-04-20 MED ORDER — ALBUTEROL SULFATE (2.5 MG/3ML) 0.083% IN NEBU: 2.5000 mg | INHALATION_SOLUTION | RESPIRATORY_TRACT | Status: DC | PRN

## 2020-04-20 MED ORDER — SODIUM CHLORIDE 0.9% FLUSH
3.0000 mL | Freq: Two times a day (BID) | INTRAVENOUS | Status: DC
  Administered 2020-04-20 – 2020-04-22 (×4): 3 mL via INTRAVENOUS

## 2020-04-20 MED ORDER — IOHEXOL 300 MG/ML  SOLN
100.0000 mL | Freq: Once | INTRAMUSCULAR | Status: AC | PRN
  Administered 2020-04-20: 80 mL via INTRAVENOUS

## 2020-04-20 MED ORDER — PANTOPRAZOLE SODIUM 40 MG IV SOLR
40.0000 mg | Freq: Every day | INTRAVENOUS | Status: DC
  Administered 2020-04-20: 40 mg via INTRAVENOUS
  Filled 2020-04-20: qty 40

## 2020-04-20 MED ORDER — ONDANSETRON HCL 4 MG/2ML IJ SOLN
4.0000 mg | Freq: Once | INTRAMUSCULAR | Status: AC
  Administered 2020-04-20: 4 mg via INTRAVENOUS
  Filled 2020-04-20: qty 2

## 2020-04-20 MED ORDER — TEMAZEPAM 15 MG PO CAPS
15.0000 mg | ORAL_CAPSULE | Freq: Every day | ORAL | Status: DC
  Administered 2020-04-20 – 2020-04-21 (×2): 15 mg via ORAL
  Filled 2020-04-20 (×2): qty 1

## 2020-04-20 MED ORDER — LORAZEPAM 2 MG/ML IJ SOLN
1.0000 mg | Freq: Once | INTRAMUSCULAR | Status: AC
  Administered 2020-04-20: 1 mg via INTRAVENOUS
  Filled 2020-04-20: qty 1

## 2020-04-20 MED ORDER — FAMOTIDINE IN NACL 20-0.9 MG/50ML-% IV SOLN
20.0000 mg | Freq: Every day | INTRAVENOUS | Status: DC
  Administered 2020-04-20 – 2020-04-21 (×2): 20 mg via INTRAVENOUS
  Filled 2020-04-20 (×2): qty 50

## 2020-04-20 MED ORDER — BUPRENORPHINE HCL-NALOXONE HCL 8-2 MG SL SUBL
1.0000 | SUBLINGUAL_TABLET | Freq: Every day | SUBLINGUAL | Status: DC
  Administered 2020-04-20 – 2020-04-22 (×3): 1 via SUBLINGUAL
  Filled 2020-04-20 (×2): qty 1

## 2020-04-20 MED ORDER — ENOXAPARIN SODIUM 40 MG/0.4ML ~~LOC~~ SOLN
40.0000 mg | SUBCUTANEOUS | Status: DC
  Administered 2020-04-20: 40 mg via SUBCUTANEOUS
  Filled 2020-04-20: qty 0.4

## 2020-04-20 MED ORDER — POLYETHYLENE GLYCOL 3350 17 G PO PACK
17.0000 g | PACK | Freq: Every day | ORAL | Status: DC | PRN
  Administered 2020-04-21: 17 g via ORAL
  Filled 2020-04-20: qty 1

## 2020-04-20 MED ORDER — IPRATROPIUM BROMIDE HFA 17 MCG/ACT IN AERS
4.0000 | INHALATION_SPRAY | Freq: Once | RESPIRATORY_TRACT | Status: AC
  Administered 2020-04-20: 4 via RESPIRATORY_TRACT
  Filled 2020-04-20: qty 12.9

## 2020-04-20 MED ORDER — IPRATROPIUM-ALBUTEROL 0.5-2.5 (3) MG/3ML IN SOLN
3.0000 mL | Freq: Four times a day (QID) | RESPIRATORY_TRACT | Status: DC
  Administered 2020-04-20 (×2): 3 mL via RESPIRATORY_TRACT
  Filled 2020-04-20 (×2): qty 3

## 2020-04-20 MED ORDER — SODIUM CHLORIDE 0.9 % IV SOLN: 500.0000 mg | INTRAVENOUS | Status: DC

## 2020-04-20 MED ORDER — ALBUTEROL SULFATE HFA 108 (90 BASE) MCG/ACT IN AERS
8.0000 | INHALATION_SPRAY | Freq: Once | RESPIRATORY_TRACT | Status: AC
  Administered 2020-04-20: 8 via RESPIRATORY_TRACT
  Filled 2020-04-20: qty 6.7

## 2020-04-20 MED ORDER — ACETAMINOPHEN 650 MG RE SUPP: 650.0000 mg | Freq: Four times a day (QID) | RECTAL | Status: DC | PRN

## 2020-04-20 MED ORDER — ACETAMINOPHEN 325 MG PO TABS
650.0000 mg | ORAL_TABLET | Freq: Four times a day (QID) | ORAL | Status: DC | PRN
  Administered 2020-04-21: 650 mg via ORAL
  Filled 2020-04-20: qty 2

## 2020-04-20 MED ORDER — MORPHINE SULFATE (PF) 4 MG/ML IV SOLN
4.0000 mg | Freq: Once | INTRAVENOUS | Status: AC
Start: 2020-04-20 — End: 2020-04-20
  Administered 2020-04-20: 4 mg via INTRAVENOUS
  Filled 2020-04-20: qty 1

## 2020-04-20 NOTE — ED Notes (Signed)
Pt SpO2 89-93% on RA. Place on 2L [place]. SpO2 increased to 99%

## 2020-04-20 NOTE — ED Triage Notes (Signed)
Patient arrived via [facility] from home with complaints of feeling increased shob. Diagnosed with pneumonia yesterday. Given 125 solumedrol and deuoneb en route.

## 2020-04-20 NOTE — ED Provider Notes (Signed)
[facility] DEPT Provider Note   CSN: [id number] Arrival date & time: 04/20/20  3557     History Chief Complaint  Patient presents with  . Shortness of Breath    Liah Morr is a 70 y.o. female.  Patient is a 70 year old female with a history of COPD with ongoing tobacco use, stroke, pneumococcal meningitis, hypertension, GI hemorrhage, prior C. difficile colitis, ongoing pancreatic cysts with chronic abdominal pain on buprenorphine, hypertension who is presenting today with multiple complaints.  Patient reports for several months now she has had increased weight loss, poor oral intake and in the last 2 days has had persistent vomiting unable to hold anything down.  Prior to the vomiting starting she had several days of diarrhea which has since resolved.  She is felt chills but has not had a fever that she is aware of.  She has had worsening shortness of breath and did talk with her doctor yesterday and was told she had ongoing pneumonia and was given a shot of steroids and a shot of antibiotic in the office.  She was given a prescription for antibiotic but reports she hasn't been able to hold it down.  She has significant pain in her right upper quadrant, epigastric region that radiates around into her right back.  Based on prior medical records this seems to be a more chronic ongoing pain but she reports it is worse in the last few days.  The thing that bothers her the most is the weight loss she is experiencing.  She reports that she is losing 1 to 2 pounds every few weeks.  She is seen for her chronic pain but reports the last time she had any imaging or surveillance was a year ago or more.  She was seeing Dr. Hilarie Fredrickson with GI but has not been back to see them in quite some time due to being unable to afford it.  She denies any alcohol use and has had a cholecystectomy.  She has been on multiple courses of antibiotics due to being told she had recurrent pneumonia.   She does use inhalers at home but that has not seemed to help with the shortness of breath recently.  She has not received the Covid vaccine but has not been around anyone she is aware of that has had Covid.  The history is provided by the patient and medical records.  Shortness of Breath      Past Medical History:  Diagnosis Date  . Acute blood loss anemia   . Arrhythmia   . Arthritis   . C. difficile colitis   . Cancer ([place])   . Diverticulosis   . Fibromyalgia   . GERD (gastroesophageal reflux disease)   . GI hemorrhage   . H/O: substance abuse ([place]) 2005   narcotic usage due to chronic back pain  . Heart murmur   . Hypertension   . Pancreatic cyst   . Pneumococcal meningitis   . PONV (postoperative nausea and vomiting)   . Renal cyst   . Stroke (cerebrum) [facility])     Patient Active Problem List   Diagnosis Date Noted  . COPD pfts pending  06/20/2017  . DOE (dyspnea on exertion) 06/19/2017  . Cervical dystonia 11/27/2016  . C. difficile colitis 09/10/2016  . Abdominal pain 09/08/2016  . Acute encephalopathy   . Fever   . Left hemiparesis ([place])   . Generalized OA   . Fibromyalgia   . Tobacco abuse   .  Substance abuse ([place])   . Benign essential HTN   . Tachycardia   . Chronic pain syndrome   . Acute blood loss anemia   . Leukocytosis   . Septic thrombophlebitis of sagittal sinus   . Acute respiratory failure ([place])   . Streptococcus pneumoniae meningitis   . Streptococcal bacteremia   . Meningitis   . Cerebral embolism with cerebral infarction 04/12/2016  . Stroke (cerebrum) ([place]) 04/12/2016  . Neck pain 04/04/2016  . Chronic abdominal pain 02/19/2016  . Essential hypertension 02/19/2016  . Bacterial conjunctivitis 02/19/2016  . Health care maintenance 02/19/2016  . ANXIETY 05/08/2006  . Cigarette smoker 05/08/2006  . ASTHMA, UNSPECIFIED 05/08/2006  . GASTROESOPHAGEAL REFLUX, NO ESOPHAGITIS 05/08/2006  . CONSTIPATION 05/08/2006  . CONVULSIONS, SEIZURES,  NOS 05/08/2006    Past Surgical History:  Procedure Laterality Date  . ABDOMINAL HYSTERECTOMY    . ABDOMINAL SURGERY    . APPENDECTOMY    . BREAST SURGERY    . CHOLECYSTECTOMY N/A 09/30/2012   Procedure: LAPAROSCOPIC CHOLECYSTECTOMY WITH INTRAOPERATIVE CHOLANGIOGRAM;  Surgeon: Imogene Burn. Georgette Dover, MD;  Location: [facility] ORS;  Service: General;  Laterality: N/A;  . FRACTURE SURGERY     right upper arm  . OVARIAN CYST SURGERY       OB History   No obstetric history on file.     Family History  Problem Relation Age of Onset  . Alzheimer's disease Mother   . Heart disease Father   . Prostate cancer Father     Social History   Tobacco Use  . Smoking status: Current Every Day Smoker    Packs/day: 0.75    Years: 49.00    Pack years: 36.75    Types: Cigarettes  . Smokeless tobacco: Never Used  Vaping Use  . Vaping Use: Never used  Substance Use Topics  . Alcohol use: No  . Drug use: No    Comment: Methadone for narcotic use    Home Medications Prior to Admission medications   Medication Sig Start Date End Date Taking? Authorizing Provider  budesonide-formoterol (SYMBICORT) 80-4.5 MCG/ACT inhaler Take 2 puffs first thing in am and then another 2 puffs about 12 hours later. 06/19/17   Tanda Rockers, MD  carisoprodol (SOMA) 350 MG tablet TAKE 1 TABLET BY MOUTH UP TO THREE TIMES DAILY AS NEEDED FOR MUSCLE SPASMS Patient not taking: No sig reported 03/19/17   [name], MD  carvedilol (COREG) 6.25 MG tablet Take 6.25 mg by mouth 3 (three) times daily.  12/25/17   [provider]  clonazePAM (KLONOPIN) 0.5 MG tablet Take 0.5 mg by mouth 4 (four) times daily as needed for anxiety.  12/02/17   [provider]  ondansetron (ZOFRAN ODT) 4 MG disintegrating tablet Take 1 tablet (4 mg total) by mouth every 8 (eight) hours as needed. Patient not taking: Reported on 05/06/2019 01/04/18   Isla Pence, MD  ondansetron (ZOFRAN) 4 MG tablet Take 1 tablet (4 mg total) by  mouth every 6 (six) hours as needed for nausea or vomiting. Patient not taking: Reported on 05/06/2019 02/25/17   Jerene Bears, MD  ondansetron (ZOFRAN) 4 MG tablet Take 1 tablet (4 mg total) by mouth every 8 (eight) hours as needed. 05/06/19   Tegeler, Gwenyth Allegra, MD  oxyCODONE (ROXICODONE) 5 MG immediate release tablet Take 1 tablet (5 mg total) by mouth every 4 (four) hours as needed for severe pain. 05/06/19   Tegeler, Gwenyth Allegra, MD  pantoprazole (PROTONIX) 40 MG tablet  Take 1 tablet (40 mg total) by mouth daily. Patient not taking: Reported on 05/06/2019 04/01/17   Jerene Bears, MD  polyethylene glycol [facility]) packet Take 17 g by mouth daily. 01/04/18   Isla Pence, MD  promethazine (PHENERGAN) 25 MG tablet Take 25 mg by mouth every 4 (four) hours as needed. 04/10/19   [provider]  simethicone (MYLICON) 532 MG chewable tablet Chew 125 mg by mouth every 6 (six) hours as needed for flatulence.    [provider]  temazepam (RESTORIL) 15 MG capsule Take 15 mg by mouth at bedtime.  12/02/17   [provider]    Allergies    Tylenol [acetaminophen], Codeine, Nsaids, Tolmetin, and Tramadol  Review of Systems   Review of Systems  Respiratory: Positive for shortness of breath.   All other systems reviewed and are negative.   Physical Exam Updated Vital Signs BP (!) 157/102   Pulse 87   Temp 97.8 F (36.6 C) (Oral)   Resp (!) 23   Ht 5\' 4"  (1.626 m)   Wt 47.6 kg   SpO2 99%   BMI 18.02 kg/m   Physical Exam Vitals and nursing note reviewed.  Constitutional:      General: She is not in acute distress.    Appearance: She is well-developed, underweight and well-nourished.  HENT:     Head: Normocephalic and atraumatic.  Eyes:     Extraocular Movements: EOM normal.     Pupils: Pupils are equal, round, and reactive to light.  Cardiovascular:     Rate and Rhythm: Normal rate and regular rhythm.     Pulses: Intact distal pulses.     Heart  sounds: Normal heart sounds. No murmur heard. No friction rub.  Pulmonary:     Effort: Pulmonary effort is normal. Tachypnea present.     Breath sounds: Wheezing present. No rales.  Abdominal:     General: Bowel sounds are normal. There is no distension.     Palpations: Abdomen is soft. There is no hepatomegaly.     Tenderness: There is abdominal tenderness in the right upper quadrant and epigastric area. There is right CVA tenderness and guarding. There is no rebound.  Musculoskeletal:        General: No tenderness. Normal range of motion.     Right lower leg: No edema.     Left lower leg: No edema.     Comments: No edema  Skin:    General: Skin is warm and dry.     Findings: No rash.     Comments: Skin is loose and hanging off her body  Neurological:     Mental Status: She is alert and oriented to person, place, and time. Mental status is at baseline.     Cranial Nerves: No cranial nerve deficit.  Psychiatric:        Mood and Affect: Mood and affect and mood normal.        Behavior: Behavior normal.        Thought Content: Thought content normal.     ED Results / Procedures / Treatments   Labs (all labs ordered are listed, but only abnormal results are displayed) Labs Reviewed  COMPREHENSIVE METABOLIC PANEL - Abnormal; Notable for the following components:      Result Value   Glucose, Bld 141 (*)    Total Protein 8.2 (*)    All other components within normal limits  CBC - Abnormal; Notable for the following components:   WBC  13.5 (*)    RBC 5.14 (*)    Hemoglobin 15.8 (*)    HCT 46.1 (*)    All other components within normal limits  SARS CORONAVIRUS 2 (TAT 6-24 HRS)  LIPASE, BLOOD  LACTIC ACID, PLASMA  D-DIMER, QUANTITATIVE (NOT AT [facility])    EKG EKG Interpretation  Date/Time:  Thursday April 20 2020 05:03:50 EST Ventricular Rate:  93 PR Interval:    QRS Duration: 97 QT Interval:  404 QTC Calculation: 503 R Axis:   53 Text Interpretation: Sinus rhythm  Right atrial enlargement RSR' in V1 or V2, probably normal variant Prolonged QT interval Confirmed by Shanon Rosser [phone]) on 04/20/2020 5:07:53 AM   Radiology DG Chest 2 View  Result Date: 04/20/2020 CLINICAL DATA:  70 year old female with increasing shortness of breath for 24 hours. EXAM: CHEST - 2 VIEW COMPARISON:  Chest radiographs 01/04/2018 and earlier. FINDINGS: Pulmonary hyperinflation is chronic, but may have progressed since 2019. Mediastinal contours remain normal. Visualized tracheal air column is within normal limits. No pneumothorax, pulmonary edema, pleural effusion or acute pulmonary opacity. Incidental nipple shadows. Osteopenia. No acute osseous abnormality identified. Negative visible bowel gas pattern. IMPRESSION: Chronic pulmonary hyperinflation. No acute cardiopulmonary abnormality. Electronically Signed   By: Genevie Ann M.D.   On: 04/20/2020 05:59   CT ABDOMEN PELVIS W CONTRAST  Result Date: 04/20/2020 CLINICAL DATA:  Epigastric pain with nausea and vomiting EXAM: CT ABDOMEN AND PELVIS WITH CONTRAST TECHNIQUE: Multidetector CT imaging of the abdomen and pelvis was performed using the standard protocol following bolus administration of intravenous contrast. CONTRAST:  103mL OMNIPAQUE IOHEXOL 300 MG/ML  SOLN COMPARISON:  05/06/2019 FINDINGS: Lower chest: No acute abnormality. Hepatobiliary: Gallbladder has been surgically removed. Biliary ductal dilatation is again noted and stable consistent with the post cholecystectomy state. Pancreas: Pancreas is well visualized. Small pancreatic head cyst is noted measuring 9.4 mm slightly increased when compared with the prior exam. Scattered smaller cysts are noted within the body of the pancreas stable in appearance from the prior exam. Spleen: Normal in size without focal abnormality. Adrenals/Urinary Tract: Adrenal glands are within normal limits. Scattered cysts are noted throughout the kidneys. No renal calculi are noted. No obstructive changes  are seen. The bladder is well distended. Stomach/Bowel: Scattered diverticular change of the colon is noted without evidence of diverticulitis. The appendix has been surgically removed. Small bowel and stomach are within normal limits. Vascular/Lymphatic: Aortic atherosclerosis. No enlarged abdominal or pelvic lymph nodes. Reproductive: Status post hysterectomy. No adnexal masses. Other: No abdominal wall hernia or abnormality. No abdominopelvic ascites. Musculoskeletal: Degenerative changes of the lumbar spine are noted. Chronic fusion at L4-5 is noted. IMPRESSION: Status post cholecystectomy with compensatory biliary ductal dilatation stable from the prior exam. Cystic lesions within the pancreas and kidneys predominately stable in appearance from the prior exam although the cyst in the head of the pancreas has increased slightly from 8 to 9 mm. Follow-up pancreatic MRI in 1 year is recommended as previously described. Diverticulosis without diverticulitis. Electronically Signed   By: Inez Catalina M.D.   On: 04/20/2020 11:07    Procedures Procedures   Medications Ordered in ED Medications  albuterol (PROVENTIL) (2.5 MG/3ML) 0.083% nebulizer solution 5 mg (has no administration in time range)  ipratropium (ATROVENT) nebulizer solution 0.5 mg (has no administration in time range)  lactated ringers bolus 1,000 mL (1,000 mLs Intravenous New Bag/Given 04/20/20 0817)  LORazepam (ATIVAN) injection 1 mg (1 mg Intravenous Given 04/20/20 0811)  morphine 4 MG/ML injection 4  mg (4 mg Intravenous Given 04/20/20 4132)    ED Course  I have reviewed the triage vital signs and the nursing notes.  Pertinent labs & imaging results that were available during my care of the patient were reviewed by me and considered in my medical decision making (see chart for details).    MDM Rules/Calculators/A&P                          Patient presenting by ambulance today for multiple complaints.  She was complaining of  shortness of breath and was given Solu-Medrol and albuterol in route which she reports did help with her breathing.  She does not wear any oxygen at home.  However she is also had this right-sided pain that seems to be getting worse as well as recurrent vomiting at home.  Patient has been on antibiotics now for over a week for concern for pneumonia.  She did see her doctor yesterday and was given a shot of antibiotics and steroids.  However now she is having persistent vomiting and unable to hold down her antibiotic.  She has had a history of chronic abdominal pain with pancreatic cysts which are thought to be pseudocyst but can't completely rule out cancer.  She has had spotty follow-up due to not being able to afford to see specialist.  On exam she does have right upper quadrant pain and some right flank pain.  She denies any urinary symptoms at this time.  She is also wheezing diffusely.  Will check for Covid, x-ray without acute findings.  Patient will most likely need abdominal imaging to reevaluate to ensure that cysts have not changed in appearance given patient is now having increased weight loss.  She was given IV fluids, antiemetic and pain control.  CBC with mild leukocytosis of 13.5 however she is also received IM steroids in the last 24 hours.  She is afebrile here and do not feel that she has evidence of sepsis at this time.  CMP, lipase, lactate, Covid are also pending.  Patient was given albuterol, Atrovent to see if that improves the wheezing.  11:42 AM Pt's labs and Ct are relatively reassuring.  Pt has had no vomiting here.  Sating 97% on RA.  Will ambulate and feed the pt to ensure she does not desat or vomit.   1:51 PM After patient's second round of albuterol and Atrovent, her wheezing has improved and she has had no further vomiting but still complains of nausea.  However now patient's saturations are between 84 and 87% on room air.  She is awake and alert but is requiring 2 L of oxygen  to maintain sats greater than 90%.  She has not been tachycardic but will check a D-dimer to ensure no need to evaluate for PE.  Feel that patient will require admission due to ongoing hypoxia despite treatment for COPD exacerbation.  D-dimer wnl.  COVID is neg.  MDM Number of Diagnoses or Management Options   Amount and/or Complexity of Data Reviewed Clinical lab tests: ordered and reviewed Tests in the radiology section of CPT: ordered and reviewed Tests in the medicine section of CPT: ordered and reviewed Decide to obtain previous medical records or to obtain history from someone other than the patient: yes Obtain history from someone other than the patient: yes Review and summarize past medical records: yes Discuss the patient with other providers: yes Independent visualization of images, tracings, or specimens: yes  Risk of Complications, Morbidity, and/or Mortality Presenting problems: high Diagnostic procedures: moderate Management options: moderate  Patient Progress Patient progress: stable   Final Clinical Impression(s) / ED Diagnoses Final diagnoses:  COPD exacerbation ([place])  Hypoxia    Rx / DC Orders ED Discharge Orders    None       Blanchie Dessert, MD 04/20/20 1511

## 2020-04-20 NOTE — H&P (Addendum)
History and Physical        Hospital Admission Note Date: 04/20/2020  Patient name: [name] Medical record number: [id number] Date of birth: 1950/07/19 Age: 70 y.o. Gender: female  PCP: [name], MD    Chief Complaint    Chief Complaint  Patient presents with  . Shortness of Breath      HPI:   This is a 70 year old female who has not been vaccinated against COVID 19 with a history of COPD, fibromyalgia, substance abuse, anxiety, s/p cholecystectomy, bacterial meningitis, GERD, stroke, hypertension, chronic abdominal pain from pancreatic cyst on buprenorphine who presented to the ED with multiple complaints including worsening RUQ abdominal pain with radiation to right flank, diarrhea as well as worsening shortness of breath for several days.  Additionally, she has reported several months of unintentional weight loss and lack of appetite. Seen by her PCP yesterday and was given a steroid injection and an antibiotic injection in the office with an antibiotic prescription but has not been able to keep the antibiotics down due to vomiting.  Takes buprenorphine for her chronic abdominal pain though her pain is not well controlled on this regimen.  Additionally, states that she has been without her inhalers for several months due to insurance issues.  She states that she frequently has COPD exacerbations and pneumonia and was treated in the outpatient setting.  Admits to chronic orthopnea.  Had central chest pain yesterday evening described as pressure.  Denies any fever or palpitations.  Denies alcohol use.   ED Course: Initially afebrile and hemodynamically stable on room air with wheezing. She was given a nebulizer treatment with improved wheeze but then had SpO2 in mid-high 80s requiring O2.. Notable Labs: Lactic acid 1.2, WBC 13.5, Hb 15.8, D dimer negative, COVID 19  negative. Notable Imaging: CXR with chronic hyperinflation but no acute abnormality. CT abd/pelvis w contrast - cystic lesions in the pancrease and kidneys predominately stable from prior exam with 1 mm increase in size in the cyst at the head of the pancreas, diverticulosis. Patient received albuterol, ipratropium, ativan, morphine, 2 L LR bolus and zofran.    Vitals:   04/20/20 1530 04/20/20 1600  BP: (!) 106/54 128/83  Pulse: 77 82  Resp: 11 16  Temp:    SpO2: 94% 93%     Review of Systems:  Review of Systems  Cardiovascular: Negative for leg swelling.  All other systems reviewed and are negative.   Medical/Social/Family History   Past Medical History: Past Medical History:  Diagnosis Date  . Acute blood loss anemia   . Arrhythmia   . Arthritis   . C. difficile colitis   . Cancer ([place])   . Diverticulosis   . Fibromyalgia   . GERD (gastroesophageal reflux disease)   . GI hemorrhage   . H/O: substance abuse ([place]) 2005   narcotic usage due to chronic back pain  . Heart murmur   . Hypertension   . Pancreatic cyst   . Pneumococcal meningitis   . PONV (postoperative nausea and vomiting)   . Renal cyst   . Stroke (cerebrum) [facility])     Past Surgical History:  Procedure Laterality Date  . ABDOMINAL HYSTERECTOMY    .  ABDOMINAL SURGERY    . APPENDECTOMY    . BREAST SURGERY    . CHOLECYSTECTOMY N/A 09/30/2012   Procedure: LAPAROSCOPIC CHOLECYSTECTOMY WITH INTRAOPERATIVE CHOLANGIOGRAM;  Surgeon: Imogene Burn. Georgette Dover, MD;  Location: [facility] ORS;  Service: General;  Laterality: N/A;  . FRACTURE SURGERY     right upper arm  . OVARIAN CYST SURGERY      Medications: Prior to Admission medications   Medication Sig Start Date End Date Taking? Authorizing Provider  buprenorphine (SUBUTEX) 8 MG SUBL SL tablet Place 8 mg under the tongue daily. 04/15/20  Yes [provider]  carvedilol (COREG) 6.25 MG tablet Take 6.25 mg by mouth 3 (three) times daily.  12/25/17  Yes [provider]  diazepam (VALIUM) 2 MG tablet Take 2 mg by mouth 4 (four) times daily as needed for anxiety. 04/02/20  Yes [provider]  pantoprazole (PROTONIX) 40 MG tablet Take 1 tablet (40 mg total) by mouth daily. 04/01/17  Yes Pyrtle, Lajuan Lines, MD  polyethylene glycol [facility]) packet Take 17 g by mouth daily. 01/04/18  Yes Isla Pence, MD  promethazine (PHENERGAN) 25 MG tablet Take 25 mg by mouth every 4 (four) hours as needed for nausea or vomiting. 04/10/19  Yes [provider]  simethicone (MYLICON) 470 MG chewable tablet Chew 125 mg by mouth every 6 (six) hours as needed for flatulence.   Yes [provider]  temazepam (RESTORIL) 15 MG capsule Take 15 mg by mouth at bedtime.  12/02/17  Yes [provider]  budesonide-formoterol (SYMBICORT) 80-4.5 MCG/ACT inhaler Take 2 puffs first thing in am and then another 2 puffs about 12 hours later. Patient not taking: Reported on 04/20/2020 06/19/17   Tanda Rockers, MD  carisoprodol (SOMA) 350 MG tablet TAKE 1 TABLET BY MOUTH UP TO THREE TIMES DAILY AS NEEDED FOR MUSCLE SPASMS Patient not taking: No sig reported 03/19/17   [name], MD  ondansetron (ZOFRAN ODT) 4 MG disintegrating tablet Take 1 tablet (4 mg total) by mouth every 8 (eight) hours as needed. Patient not taking: No sig reported 01/04/18   Isla Pence, MD  ondansetron (ZOFRAN) 4 MG tablet Take 1 tablet (4 mg total) by mouth every 6 (six) hours as needed for nausea or vomiting. Patient not taking: No sig reported 02/25/17   Pyrtle, Lajuan Lines, MD  ondansetron (ZOFRAN) 4 MG tablet Take 1 tablet (4 mg total) by mouth every 8 (eight) hours as needed. Patient not taking: No sig reported 05/06/19   Tegeler, Gwenyth Allegra, MD  oxyCODONE (ROXICODONE) 5 MG immediate release tablet Take 1 tablet (5 mg total) by mouth every 4 (four) hours as needed for severe pain. Patient not taking: No sig reported 05/06/19   Tegeler, Gwenyth Allegra, MD    Allergies:    Allergies  Allergen Reactions  . Tylenol [Acetaminophen] Other (See Comments)    Inflammed Liver  . Codeine Nausea And Vomiting  . Nsaids Other (See Comments)    Stomach ulcers  . Tolmetin Other (See Comments)    Stomach ulcers  . Tramadol     GI UPSET    Social History:  reports that she has been smoking cigarettes. She has a 36.75 pack-year smoking history. She has never used smokeless tobacco. She reports that she does not drink alcohol and does not use drugs.  Family History: Family History  Problem Relation Age of Onset  . Alzheimer's disease Mother   . Heart disease Father   . Prostate cancer Father  Objective   Physical Exam: Blood pressure 128/83, pulse 82, temperature 97.8 F (36.6 C), temperature source Oral, resp. rate 16, height 5\' 4"  (1.626 m), weight 47.6 kg, SpO2 93 %.  Physical Exam Vitals and nursing note reviewed.  Constitutional:      General: She is not in acute distress.    Comments: Frail female appears older than stated age  HENT:     Head: Normocephalic.  Cardiovascular:     Rate and Rhythm: Normal rate and regular rhythm.  Pulmonary:     Effort: Pulmonary effort is normal. No tachypnea.     Breath sounds: Wheezing present. No rales.     Comments: Diffuse wheezes Abdominal:     General: Bowel sounds are normal.     Tenderness: There is abdominal tenderness.  Musculoskeletal:     Right lower leg: No tenderness. No edema.     Left lower leg: No tenderness. No edema.  Neurological:     General: No focal deficit present.     Mental Status: She is alert.  Psychiatric:        Mood and Affect: Mood normal.        Behavior: Behavior normal.     LABS on Admission: I have personally reviewed all the labs and imaging below    Basic Metabolic Panel: Recent Labs  Lab 04/20/20 0827  NA 141  K 4.0  CL 101  CO2 27  GLUCOSE 141*  BUN 19  CREATININE 0.61  CALCIUM 10.3   Liver Function Tests: Recent Labs  Lab 04/20/20 0827  AST  21  ALT 15  ALKPHOS 85  BILITOT 0.7  PROT 8.2*  ALBUMIN 4.8   Recent Labs  Lab 04/20/20 0827  LIPASE 30   No results for input(s): AMMONIA in the last 168 hours. CBC: Recent Labs  Lab 04/20/20 0827  WBC 13.5*  HGB 15.8*  HCT 46.1*  MCV 89.7  PLT 361   Cardiac Enzymes: No results for input(s): CKTOTAL, CKMB, CKMBINDEX, TROPONINI in the last 168 hours. BNP: Invalid input(s): POCBNP CBG: No results for input(s): GLUCAP in the last 168 hours.  Radiological Exams on Admission:  DG Chest 2 View  Result Date: 04/20/2020 CLINICAL DATA:  70 year old female with increasing shortness of breath for 24 hours. EXAM: CHEST - 2 VIEW COMPARISON:  Chest radiographs 01/04/2018 and earlier. FINDINGS: Pulmonary hyperinflation is chronic, but may have progressed since 2019. Mediastinal contours remain normal. Visualized tracheal air column is within normal limits. No pneumothorax, pulmonary edema, pleural effusion or acute pulmonary opacity. Incidental nipple shadows. Osteopenia. No acute osseous abnormality identified. Negative visible bowel gas pattern. IMPRESSION: Chronic pulmonary hyperinflation. No acute cardiopulmonary abnormality. Electronically Signed   By: Genevie Ann M.D.   On: 04/20/2020 05:59   CT ABDOMEN PELVIS W CONTRAST  Result Date: 04/20/2020 CLINICAL DATA:  Epigastric pain with nausea and vomiting EXAM: CT ABDOMEN AND PELVIS WITH CONTRAST TECHNIQUE: Multidetector CT imaging of the abdomen and pelvis was performed using the standard protocol following bolus administration of intravenous contrast. CONTRAST:  81mL OMNIPAQUE IOHEXOL 300 MG/ML  SOLN COMPARISON:  05/06/2019 FINDINGS: Lower chest: No acute abnormality. Hepatobiliary: Gallbladder has been surgically removed. Biliary ductal dilatation is again noted and stable consistent with the post cholecystectomy state. Pancreas: Pancreas is well visualized. Small pancreatic head cyst is noted measuring 9.4 mm slightly increased when compared  with the prior exam. Scattered smaller cysts are noted within the body of the pancreas stable in appearance from the prior exam. Spleen:  Normal in size without focal abnormality. Adrenals/Urinary Tract: Adrenal glands are within normal limits. Scattered cysts are noted throughout the kidneys. No renal calculi are noted. No obstructive changes are seen. The bladder is well distended. Stomach/Bowel: Scattered diverticular change of the colon is noted without evidence of diverticulitis. The appendix has been surgically removed. Small bowel and stomach are within normal limits. Vascular/Lymphatic: Aortic atherosclerosis. No enlarged abdominal or pelvic lymph nodes. Reproductive: Status post hysterectomy. No adnexal masses. Other: No abdominal wall hernia or abnormality. No abdominopelvic ascites. Musculoskeletal: Degenerative changes of the lumbar spine are noted. Chronic fusion at L4-5 is noted. IMPRESSION: Status post cholecystectomy with compensatory biliary ductal dilatation stable from the prior exam. Cystic lesions within the pancreas and kidneys predominately stable in appearance from the prior exam although the cyst in the head of the pancreas has increased slightly from 8 to 9 mm. Follow-up pancreatic MRI in 1 year is recommended as previously described. Diverticulosis without diverticulitis. Electronically Signed   By: Inez Catalina M.D.   On: 04/20/2020 11:07      EKG: Sinus rhythm with RSR` in V1 and V2 with nonspecific TWI in V1 and V2. Seen in prior EKGs   A & P   Principal Problem:   COPD with acute exacerbation (HCC) Active Problems:   Anxiety   GERD (gastroesophageal reflux disease)   Essential hypertension   Acute respiratory failure (HCC)   Chronic pain syndrome   Shortness of breath   Chronic diastolic CHF (congestive heart failure) (HCC)   Nausea & vomiting   1. Acute hypoxic respiratory failure with suspected COPD exacerbation a. Possible viral etiology and/or due to not  taking home Symbicort due to insurance issues b. Scheduled duo nebs c. Albuterol every 2 hours as needed d. Change home Symbicort to [facility] due to inpatient formulary e. Doxycycline IV as she is n.p.o. and with prolonged QTc f. Procalcitonin g. BNP h. Troponin i. Pepcid in case GERD component j. TOC consult for medication assistance k. Given her reported frequent exacerbations and poor control would recommend at least outpatient pulmonary follow-up  2. Intractable nausea and vomiting with acute on chronic abdominal pain in the setting of pancreatic cyst a. Reported to have chronic pancreatitis b. Zofran as needed but monitor QTc c. On chronic buprenorphine for chronic pain. Will change buprenorphine to Suboxone while inpatient due to hospital formulary and avoid opiates while on Suboxone d. Toradol IV x1 for now e. N.p.o. f. Protonix IV g. Follow-up pancreatic MRI in 1 year  3. Prolonged QT  a. Telemetry b. Goal K > 4.0 goal Mg > 2.0 c. Avoid QT prolonging agents as possible  4. Anxiety a. On benzodiazepines 4 times daily outpatient and Restoril nightly b. Continue Restoril and change to Ativan 0.5 mg IV 3 times daily for now  5. Unintentional weight loss with poor appetite a. DDx: Malignancy versus pulmonary cachexia from COPD versus poor p.o. intake b. Dietitian consult  6. GERD a. Pepcid IV while n.p.o. due to prolonged QT  7. Leukocytosis a. Possibly steroid-induced as she received steroids prior to arrival b. Continue to monitor  8. Chronic Diastolic CHF a. Unlikely acute CHF exacerbation b. Check BNP c. Continue Coreg  9. Hypertension a. Continue coreg      DVT prophylaxis: Lovenox   Code Status: DNR  Diet: NPO Family Communication: Admission, patients condition and plan of care including tests being ordered have been discussed with the patient who indicates understanding and agrees with the plan and  Code Status.  Disposition Plan: The appropriate patient  status for this patient is INPATIENT. Inpatient status is judged to be reasonable and necessary in order to provide the required intensity of service to ensure the patient's safety. The patient's presenting symptoms, physical exam findings, and initial radiographic and laboratory data in the context of their chronic comorbidities is felt to place them at high risk for further clinical deterioration. Furthermore, it is not anticipated that the patient will be medically stable for discharge from the hospital within 2 midnights of admission. The following factors support the patient status of inpatient.   " The patient's presenting symptoms include shortness of breath, nausea and vomiting, abdominal pain. " The worrisome physical exam findings include abdominal pain. " The initial radiographic and laboratory data are worrisome because of pancreatic cyst. " The chronic co-morbidities include COPD, anxiety, chronic pain, pancreatic cyst.   * I certify that at the point of admission it is my clinical judgment that the patient will require inpatient hospital care spanning beyond 2 midnights from the point of admission due to high intensity of service, high risk for further deterioration and high frequency of surveillance required.*   The medical decision making on this patient was of high complexity and the patient is at high risk for clinical deterioration, therefore this is a level 3  admission.  Consultants  . None  Procedures  . None  Time Spent on Admission: 71 minutes    Harold Hedge, DO [facility]  04/20/2020, 5:14 PM

## 2020-04-20 NOTE — ED Notes (Signed)
Rec'd verbal order from [facility] MD to change nebulizers to inhalers. 8 puffs albuterol and 4 puffs atrovent

## 2020-04-21 ENCOUNTER — Other Ambulatory Visit: Payer: Self-pay

## 2020-04-21 DIAGNOSIS — J9601 Acute respiratory failure with hypoxia: Secondary | ICD-10-CM | POA: Diagnosis not present

## 2020-04-21 DIAGNOSIS — G894 Chronic pain syndrome: Secondary | ICD-10-CM

## 2020-04-21 DIAGNOSIS — R112 Nausea with vomiting, unspecified: Secondary | ICD-10-CM | POA: Diagnosis not present

## 2020-04-21 DIAGNOSIS — J441 Chronic obstructive pulmonary disease with (acute) exacerbation: Secondary | ICD-10-CM | POA: Diagnosis not present

## 2020-04-21 LAB — RESPIRATORY PANEL BY PCR

## 2020-04-21 LAB — BASIC METABOLIC PANEL
Anion gap: 8 (ref 5–15)
BUN: 26 mg/dL — ABNORMAL HIGH (ref 8–23)
CO2: 28 mmol/L (ref 22–32)
Calcium: 9.4 mg/dL (ref 8.9–10.3)
Chloride: 104 mmol/L (ref 98–111)
Creatinine, Ser: 0.59 mg/dL (ref 0.44–1.00)
GFR, Estimated: >60 mL/min (ref >60)
Glucose, Bld: 125 mg/dL — ABNORMAL HIGH (ref 70–99)
Potassium: 3.8 mmol/L (ref 3.5–5.1)
Sodium: 140 mmol/L (ref 135–145)

## 2020-04-21 LAB — CBC
HCT: 37.8 % (ref 36.0–46.0)
Hemoglobin: 12.5 g/dL (ref 12.0–15.0)
MCH: 30.9 pg (ref 26.0–34.0)
MCHC: 33.1 g/dL (ref 30.0–36.0)
MCV: 93.3 fL (ref 80.0–100.0)
Platelets: 259 10*3/uL (ref 150–400)
RBC: 4.05 MIL/uL (ref 3.87–5.11)
RDW: 13.5 % (ref 11.5–15.5)
WBC: 11.7 10*3/uL — ABNORMAL HIGH (ref 4.0–10.5)
nRBC: 0 % (ref 0.0–0.2)

## 2020-04-21 MED ORDER — POLYETHYLENE GLYCOL 3350 17 G PO PACK
17.0000 g | PACK | Freq: Every day | ORAL | Status: DC
  Administered 2020-04-22: 17 g via ORAL
  Filled 2020-04-21: qty 1

## 2020-04-21 MED ORDER — LORAZEPAM 2 MG/ML IJ SOLN
1.0000 mg | Freq: Four times a day (QID) | INTRAMUSCULAR | Status: DC
  Administered 2020-04-21 (×2): 1 mg via INTRAVENOUS
  Filled 2020-04-21 (×2): qty 1

## 2020-04-21 MED ORDER — ONDANSETRON HCL 4 MG/2ML IJ SOLN
4.0000 mg | Freq: Three times a day (TID) | INTRAMUSCULAR | Status: DC | PRN
  Administered 2020-04-21 (×2): 4 mg via INTRAVENOUS
  Filled 2020-04-21 (×2): qty 2

## 2020-04-21 MED ORDER — IPRATROPIUM-ALBUTEROL 0.5-2.5 (3) MG/3ML IN SOLN
3.0000 mL | Freq: Two times a day (BID) | RESPIRATORY_TRACT | Status: DC
  Administered 2020-04-21: 3 mL via RESPIRATORY_TRACT
  Filled 2020-04-21: qty 3

## 2020-04-21 MED ORDER — IPRATROPIUM-ALBUTEROL 0.5-2.5 (3) MG/3ML IN SOLN
3.0000 mL | Freq: Three times a day (TID) | RESPIRATORY_TRACT | Status: DC
  Administered 2020-04-21: 3 mL via RESPIRATORY_TRACT
  Filled 2020-04-21: qty 3

## 2020-04-21 MED ORDER — SCOPOLAMINE 1 MG/3DAYS TD PT72
1.0000 | MEDICATED_PATCH | TRANSDERMAL | Status: DC
  Administered 2020-04-21: 1.5 mg via TRANSDERMAL
  Filled 2020-04-21: qty 1

## 2020-04-21 MED ORDER — DIAZEPAM 2 MG PO TABS
2.0000 mg | ORAL_TABLET | Freq: Four times a day (QID) | ORAL | Status: DC | PRN
  Administered 2020-04-22 (×3): 2 mg via ORAL
  Filled 2020-04-21 (×3): qty 1

## 2020-04-21 MED ORDER — PANTOPRAZOLE SODIUM 40 MG PO TBEC
40.0000 mg | DELAYED_RELEASE_TABLET | Freq: Every day | ORAL | Status: DC
  Administered 2020-04-22: 40 mg via ORAL
  Filled 2020-04-21: qty 1

## 2020-04-21 NOTE — H&P (View-Only) (Signed)
Referring Provider:  [facility]         Primary Care Physician:  [name], [name] Primary Gastroenterologist:  Zenovia Jarred, [name]            We were asked to see this patient for:  Nausea, vomiting, pancreatic cysts.             ASSESSMENT / PLAN:    # 70 yo female admitted with COPD exacerbation, complains of excessive weight loss, chronic nausea,  vomiting and abdominal pain.   No weights in epic since Oct 2019 when patient was 110 pounds.  She is currently at 105 pounds now. Her symptoms are the same as when we evaluated her in 2019. Numerous CT scan, MRCP, EGD and colonoscopy haven't been diagnostic. She does have a known small pancreatic head cyst is measuring 9.4 mm on CT scan yesterday and slightly increased compared to scan one year ago. Smaller cysts are noted within the body of the pancreas stable in appearance from the prior exam.  Seems unlikely that these small cysts would be causing her chronic nausea, vomiting and abdominal pain unless she has underlying chronic pancreatitis?  Also wonder about delayed gastric emptying / bowel dysmotility related to her home medications. CT scan negative for any acute abnormalities.   --Anti-emetic regimen limited by her prolonged QTC. I don't know that one anti-emetic is  potentially less detrimental than the other. Scopolamine patch s an option but may be too sedating. She is already on chronic benzos which can sometimes be helpful with vomiting. Tricyclics can be helpful but maybe contraindicated given prolonged QTc --Dr. Ardis Hughs will cysts on CT scan but again they seem small to be causing all her pain and vomiting.    # COPD, being treated for exacerbation  # History of Barrett's esophagus diagnosed January 2019 --Due for surveillance EGD  # History of sessile serrated and adenomatous colon polyps January 2019.    HPI:                                                                                                                              Chief Complaint: nausea , vomiting abdominal pain and weight loss.   [name] is a 70 y.o. female with PMH significant for COPD with ongoing tobacco use, remote narcotic abuse, stroke, pneumococcal meningitis, hypertension, C. difficile colitis, chronic abdominal pain, pancreatic cysts, diverticulosis, cholecystectomy, hysterectomy, appendectomy, oophorectomy  Patient presented to the ED yesterday with shortness of breath and recent diagnosis of PNA by her PCP .  She also gave a history of weight loss,  nausea and vomiting and abdominal pain.     Patient's history of right sided abdominal pain, nausea and weight loss dates back to at least 2018 when she established care with [place].  Her liver chemistries were found in hospital to be elevated but MRCP negative. Suspected to have DILI from Tylenol use.   She underwent EGD and  colonoscopy (see below) in January 2019 which were unrevealing for source of symptoms but did show candida esophagitis, Barrett's esophagus and a few colon polyps. She has not followed up with [place] for financial reasons. Patient has had numerous CT scans through the years for evaluation of N/V and abdominal pain. Findings have mainly shown with stable pancreatic and renal cysts, mild PD dilation, large stool burden, gastric and duodenal thickening ( which was evaluated later by EGD with finding of reactive gastropathy). In 2018 she did have elevated and underwent MRI which showed upper normal intra/ extrahepatic biliary duct dilation without cause. Imaging since then has only suggest post cholecystectomy compensatory duct dilation.   Patient says the RUQ pain is nearly constant, worse after eating. The pain radiates around right side into her back.  She has a poor appetite and frequently vomits after meals. Emesis often contains food, it is non-bloody. She takes her BMs are generally normal 3 weeks out of the month ( solid daily BM) but one a month she gets " colitis"  with non-bloody diarrhea. Patient says that Dr. Royce Macadamia gives her Ativan three times a day for chronic abdominal pain ( valium is on home med list) and is also on subutex. Oxycodone on home med list but patient says she isn't taking it.   In the ED her liver test and lipase are normal.  Hemoglobin 15.8, hematocrit also elevated. WBC 13.5.  CT scan with contrast shows that she is status post cholecystectomy with compensatory biliary duct dilation (stable), cystic lesions in the pancreas and kidneys, stable in appearance except for the cyst in the head of the pancreas which has increased slightly by 1 mm, diverticulosis.      PREVIOUS ENDOSCOPIC EVALUATIONS / [place] STUDIES   Jan 2019 screening colonoscopy -One 7 mm polyp at the hepatic flexure, removed with a cold snare. Resected and retrieved. - One 4 mm polyp in the sigmoid colon, removed with a cold snare. Resected and retrieved. - One 10 mm polyp in the rectum, removed with a hot snare. Resected and retrieved. - Mild diverticulosis in the sigmoid colon. - External and internal hemorrhoids.  January 2019 EGD for abdominal pain, nausea and wall thickening of stomach on CT scan -Monilial esophagitis. - Z-line irregular, 35 cm from the incisors. Biopsied. - Gastritis. Biopsied. - Normal examined duodenum.  Diagnosis 1. Surgical [P], gastric antrum and gastric body - REACTIVE GASTROPATHY. - THERE IS NO EVIDENCE OF HELICOBACTER PYLORI, DYSPLASIA, OR MALIGNANCY. - SEE COMMENT. 2. Surgical [P], irregular z line, esophagus - INTESTINAL METAPLASIA (GOBLET CELL METAPLASIA) CONSISTENT WITH BARRETT'S ESOPHAGUS. - THERE IS NO EVIDENCE OF DYSPLASIA OR MALIGNANCY. 3. Surgical [P], hepatic flexure, polyp, colon - SESSILE SERRATED POLYP WITHOUT CYTOLOGIC DYSPLASIA. 4. Surgical [P], sigmoid, polyp - SESSILE SERRATED POLYP WITHOUT CYTOLOGIC DYSPLASIA. 5. Surgical [P], rectum, polyp - TUBULAR ADENOMA(S). - HIGH GRADE DYSPLASIA IS NOT  IDENTIFIED. Microscopic Comment 1. This pattern can be associated with non-steroidal anti-inflammatory drugs (NSAIDS), alcohol or with other causes of chemical gastropathy. Helicobacter pylori are not identified with Warthin-Starry stain.    Past Medical History:  Diagnosis Date  . Acute blood loss anemia   . Arrhythmia   . Arthritis   . C. difficile colitis   . Cancer ([place])   . Diverticulosis   . Fibromyalgia   . GERD (gastroesophageal reflux disease)   . GI hemorrhage   . H/O: substance abuse ([place]) 2005   narcotic usage due to chronic back pain  . Heart murmur   .  Hypertension   . Pancreatic cyst   . Pneumococcal meningitis   . PONV (postoperative nausea and vomiting)   . Renal cyst   . Stroke (cerebrum) [facility])     Past Surgical History:  Procedure Laterality Date  . ABDOMINAL HYSTERECTOMY    . ABDOMINAL SURGERY    . APPENDECTOMY    . BREAST SURGERY    . CHOLECYSTECTOMY N/A 09/30/2012   Procedure: LAPAROSCOPIC CHOLECYSTECTOMY WITH INTRAOPERATIVE CHOLANGIOGRAM;  Surgeon: Imogene Burn. Georgette Dover, [name];  Location: [facility];  Service: General;  Laterality: N/A;  . FRACTURE SURGERY     right upper arm  . OVARIAN CYST SURGERY      Prior to Admission medications   Medication Sig Start Date End Date Taking? Authorizing Provider  buprenorphine (SUBUTEX) 8 MG SUBL SL tablet Place 8 mg under the tongue daily. 04/15/20  Yes Provider, Historical, [name]  carvedilol (COREG) 6.25 MG tablet Take 6.25 mg by mouth 3 (three) times daily.  12/25/17  Yes Provider, Historical, [name]  diazepam (VALIUM) 2 MG tablet Take 2 mg by mouth 4 (four) times daily as needed for anxiety. 04/02/20  Yes Provider, Historical, [name]  pantoprazole (PROTONIX) 40 MG tablet Take 1 tablet (40 mg total) by mouth daily. 04/01/17  Yes Pyrtle, Lajuan Lines, [name]  polyethylene glycol [facility]) packet Take 17 g by mouth daily. 01/04/18  Yes Isla Pence, [name]  promethazine (PHENERGAN) 25 MG tablet Take 25 mg by mouth every 4 (four) hours as needed  for nausea or vomiting. 04/10/19  Yes Provider, Historical, [name]  simethicone (MYLICON) 914 MG chewable tablet Chew 125 mg by mouth every 6 (six) hours as needed for flatulence.   Yes Provider, Historical, [name]  temazepam (RESTORIL) 15 MG capsule Take 15 mg by mouth at bedtime.  12/02/17  Yes Provider, Historical, [name]  budesonide-formoterol (SYMBICORT) 80-4.5 MCG/ACT inhaler Take 2 puffs first thing in am and then another 2 puffs about 12 hours later. Patient not taking: Reported on 04/20/2020 06/19/17   Tanda Rockers, [name]  carisoprodol (SOMA) 350 MG tablet TAKE 1 TABLET BY MOUTH UP TO THREE TIMES DAILY AS NEEDED FOR MUSCLE SPASMS Patient not taking: No sig reported 03/19/17   [name], [name]  ondansetron (ZOFRAN ODT) 4 MG disintegrating tablet Take 1 tablet (4 mg total) by mouth every 8 (eight) hours as needed. Patient not taking: No sig reported 01/04/18   Isla Pence, [name]  ondansetron (ZOFRAN) 4 MG tablet Take 1 tablet (4 mg total) by mouth every 6 (six) hours as needed for nausea or vomiting. Patient not taking: No sig reported 02/25/17   Pyrtle, Lajuan Lines, [name]  ondansetron (ZOFRAN) 4 MG tablet Take 1 tablet (4 mg total) by mouth every 8 (eight) hours as needed. Patient not taking: No sig reported 05/06/19   Tegeler, Gwenyth Allegra, [name]  oxyCODONE (ROXICODONE) 5 MG immediate release tablet Take 1 tablet (5 mg total) by mouth every 4 (four) hours as needed for severe pain. Patient not taking: No sig reported 05/06/19   Tegeler, Gwenyth Allegra, [name]    Current Facility-Administered Medications  Medication Dose Route Frequency Provider Last Rate Last Admin  . acetaminophen (TYLENOL) tablet 650 mg  650 mg Oral Q6H PRN Marissa Hedge, [name]       Or  . acetaminophen (TYLENOL) suppository 650 mg  650 mg Rectal Q6H PRN Marissa Hedge, [name]      . albuterol (PROVENTIL) (2.5 MG/3ML) 0.083% nebulizer solution 2.5 mg  2.5 mg Nebulization Q2H PRN Neysa Bonito,  Chauncy Passy, [name]      . buprenorphine-naloxone (SUBOXONE) 8-2 mg per SL  tablet 1 tablet  1 tablet Sublingual Daily Marissa Hedge, [name]   1 tablet at 04/21/20 0947  . carvedilol (COREG) tablet 6.25 mg  6.25 mg Oral TID Marissa Hedge, [name]   6.25 mg at 04/21/20 0947  . doxycycline (VIBRAMYCIN) 100 mg in sodium chloride 0.9 % 250 mL IVPB  100 mg Intravenous Q12H Marissa Hedge, [name]   Stopped at 04/21/20 [phone]  . enoxaparin (LOVENOX) injection 40 mg  40 mg Subcutaneous Q24H Marissa Hedge, [name]   40 mg at 04/20/20 1719  . famotidine (PEPCID) IVPB 20 mg premix  20 mg Intravenous Daily Marissa Hedge, [name]   Stopped at 04/21/20 1001  . ipratropium-albuterol (DUONEB) 0.5-2.5 (3) MG/3ML nebulizer solution 3 mL  3 mL Nebulization BID Hongalgi, Anand D, [name]      . ketorolac (TORADOL) 15 MG/ML injection 15 mg  15 mg Intravenous Once Marissa Hedge, [name]      . LORazepam (ATIVAN) injection 1 mg  1 mg Intravenous Q6H Modena Jansky, [name]   1 mg at 04/21/20 0807  . mometasone-formoterol (DULERA) 100-5 MCG/ACT inhaler 2 puff  2 puff Inhalation BID Marissa Hedge, [name]   2 puff at 04/21/20 [phone]  . ondansetron (ZOFRAN) injection 4 mg  4 mg Intravenous Q8H PRN Modena Jansky, [name]   4 mg at 04/21/20 0953  . polyethylene glycol (MIRALAX / GLYCOLAX) packet 17 g  17 g Oral Daily PRN Marissa Hedge, [name]   17 g at 04/21/20 0948  . [START ON 04/22/2020] predniSONE (DELTASONE) tablet 40 mg  40 mg Oral Q breakfast Marva Panda E, [name]      . sodium chloride flush (NS) 0.9 % injection 3 mL  3 mL Intravenous Q12H Marissa Hedge, [name]   3 mL at 04/21/20 0954  . temazepam (RESTORIL) capsule 15 mg  15 mg Oral QHS Marissa Hedge, [name]   15 mg at 04/20/20 2216   Current Outpatient Medications  Medication Sig Dispense Refill  . buprenorphine (SUBUTEX) 8 MG SUBL SL tablet Place 8 mg under the tongue daily.    . carvedilol (COREG) 6.25 MG tablet Take 6.25 mg by mouth 3 (three) times daily.   6  . diazepam (VALIUM) 2 MG tablet Take 2 mg by mouth 4 (four) times daily as needed for anxiety.    . pantoprazole (PROTONIX) 40  MG tablet Take 1 tablet (40 mg total) by mouth daily. 90 tablet 3  . polyethylene glycol (MIRALAX) packet Take 17 g by mouth daily. 14 each 0  . promethazine (PHENERGAN) 25 MG tablet Take 25 mg by mouth every 4 (four) hours as needed for nausea or vomiting.    . simethicone (MYLICON) 626 MG chewable tablet Chew 125 mg by mouth every 6 (six) hours as needed for flatulence.    . temazepam (RESTORIL) 15 MG capsule Take 15 mg by mouth at bedtime.   0  . budesonide-formoterol (SYMBICORT) 80-4.5 MCG/ACT inhaler Take 2 puffs first thing in am and then another 2 puffs about 12 hours later. (Patient not taking: Reported on 04/20/2020) 1 Inhaler 11  . carisoprodol (SOMA) 350 MG tablet TAKE 1 TABLET BY MOUTH UP TO THREE TIMES DAILY AS NEEDED FOR MUSCLE SPASMS (Patient not taking: No sig reported) 30 tablet 0  . ondansetron (ZOFRAN ODT) 4 MG disintegrating tablet Take 1 tablet (4 mg total) by mouth  every 8 (eight) hours as needed. (Patient not taking: No sig reported) 10 tablet 0  . ondansetron (ZOFRAN) 4 MG tablet Take 1 tablet (4 mg total) by mouth every 6 (six) hours as needed for nausea or vomiting. (Patient not taking: No sig reported) 120 tablet 2  . ondansetron (ZOFRAN) 4 MG tablet Take 1 tablet (4 mg total) by mouth every 8 (eight) hours as needed. (Patient not taking: No sig reported) 12 tablet 0  . oxyCODONE (ROXICODONE) 5 MG immediate release tablet Take 1 tablet (5 mg total) by mouth every 4 (four) hours as needed for severe pain. (Patient not taking: No sig reported) 6 tablet 0    Allergies as of 04/20/2020 - Review Complete 04/20/2020  Allergen Reaction Noted  . Tylenol [acetaminophen] Other (See Comments) 11/13/2016  . Codeine Nausea And Vomiting 08/19/2012  . Nsaids Other (See Comments) 08/19/2012  . Tolmetin Other (See Comments) 08/19/2012  . Tramadol  06/06/2017    Family History  Problem Relation Age of Onset  . Alzheimer's disease Mother   . Heart disease Father   . Prostate cancer  Father     Social History   Socioeconomic History  . Marital status: Married    Spouse name: Not on file  . Number of children: Not on file  . Years of education: Not on file  . Highest education level: Not on file  Occupational History  . Not on file  Tobacco Use  . Smoking status: Current Every Day Smoker    Packs/day: 0.75    Years: 49.00    Pack years: 36.75    Types: Cigarettes  . Smokeless tobacco: Never Used  Vaping Use  . Vaping Use: Never used  Substance and Sexual Activity  . Alcohol use: No  . Drug use: No    Comment: Methadone for narcotic use  . Sexual activity: Not Currently  Other Topics Concern  . Not on file  Social History Narrative  . Not on file   Social Determinants of Health   Financial Resource Strain: Not on file  Food Insecurity: Not on file  Transportation Needs: Not on file  Physical Activity: Not on file  Stress: Not on file  Social Connections: Not on file  Intimate Partner Violence: Not on file    Review of Systems: All systems reviewed and negative except where noted in HPI.  OBJECTIVE:    Physical Exam: Vital signs in last 24 hours: Pulse Rate:  [64-92] 64 (02/11 1100) Resp:  [9-20] 10 (02/11 1100) BP: (90-154)/(53-95) 113/62 (02/11 1100) SpO2:  [84 %-98 %] 91 % (02/11 1100)   General:   Alert  Thin female in NAD Psych:  Pleasant, cooperative. Normal mood and affect. Eyes:  Pupils equal, sclera clear, no icterus.   Conjunctiva pink. Ears:  Normal auditory acuity. Nose:  No deformity, discharge,  or lesions. Neck:  Supple; no masses Lungs:  Clear throughout to auscultation.   No wheezes, crackles, or rhonchi.  Heart:  Regular rate and rhythm; no murmurs, no lower extremity edema Abdomen:  Soft, non-distended, nontender, BS active, no palp mass   Rectal:  Deferred  Msk:  Symmetrical without gross deformities. . Neurologic:  Alert and  oriented x4;  grossly normal neurologically. Skin:  Intact without significant lesions  or rashes.  Filed Weights   04/20/20 0502  Weight: 47.6 kg     Scheduled inpatient medications . buprenorphine-naloxone  1 tablet Sublingual Daily  . carvedilol  6.25 mg Oral TID  .  enoxaparin (LOVENOX) injection  40 mg Subcutaneous Q24H  . ipratropium-albuterol  3 mL Nebulization BID  . ketorolac  15 mg Intravenous Once  . LORazepam  1 mg Intravenous Q6H  . mometasone-formoterol  2 puff Inhalation BID  . [START ON 04/22/2020] predniSONE  40 mg Oral Q breakfast  . sodium chloride flush  3 mL Intravenous Q12H  . temazepam  15 mg Oral QHS      Intake/Output from previous day: 02/10 0701 - 02/11 0700 In: 250 [IV Piggyback:250] Out: -  Intake/Output this shift: No intake/output data recorded.   Lab Results: Recent Labs    04/20/20 0827 04/21/20 0358  WBC 13.5* 11.7*  HGB 15.8* 12.5  HCT 46.1* 37.8  PLT 361 259   BMET Recent Labs    04/20/20 0827 04/21/20 0358  NA 141 140  K 4.0 3.8  CL 101 104  CO2 27 28  GLUCOSE 141* 125*  BUN 19 26*  CREATININE 0.61 0.59  CALCIUM 10.3 9.4   LFT Recent Labs    04/20/20 0827  PROT 8.2*  ALBUMIN 4.8  AST 21  ALT 15  ALKPHOS 85  BILITOT 0.7   . CBC Latest Ref Rng & Units 04/21/2020 04/20/2020 05/06/2019  WBC 4.0 - 10.5 K/uL 11.7(H) 13.5(H) 15.7(H)  Hemoglobin 12.0 - 15.0 g/dL 12.5 15.8(H) 16.1(H)  Hematocrit 36.0 - 46.0 % 37.8 46.1(H) 46.9(H)  Platelets 150 - 400 K/uL 259 361 408(H)    . CMP Latest Ref Rng & Units 04/21/2020 04/20/2020 05/06/2019  Glucose 70 - 99 mg/dL 125(H) 141(H) 129(H)  BUN 8 - 23 mg/dL 26(H) 19 22  Creatinine 0.44 - 1.00 mg/dL 0.59 0.61 1.01(H)  Sodium 135 - 145 mmol/L 140 141 140  Potassium 3.5 - 5.1 mmol/L 3.8 4.0 2.9(L)  Chloride 98 - 111 mmol/L 104 101 103  CO2 22 - 32 mmol/L 28 27 24   Calcium 8.9 - 10.3 mg/dL 9.4 10.3 9.8  Total Protein 6.5 - 8.1 g/dL - 8.2(H) 7.3  Total Bilirubin 0.3 - 1.2 mg/dL - 0.7 0.8  Alkaline Phos 38 - 126 U/L - 85 62  AST 15 - 41 U/L - 21 19  ALT 0 - 44 U/L  - 15 18   Studies/Results: DG Chest 2 View  Result Date: 04/20/2020 CLINICAL DATA:  70 year old female with increasing shortness of breath for 24 hours. EXAM: CHEST - 2 VIEW COMPARISON:  Chest radiographs 01/04/2018 and earlier. FINDINGS: Pulmonary hyperinflation is chronic, but may have progressed since 2019. Mediastinal contours remain normal. Visualized tracheal air column is within normal limits. No pneumothorax, pulmonary edema, pleural effusion or acute pulmonary opacity. Incidental nipple shadows. Osteopenia. No acute osseous abnormality identified. Negative visible bowel gas pattern. IMPRESSION: Chronic pulmonary hyperinflation. No acute cardiopulmonary abnormality. Electronically Signed   By: Marissa Ann M.D.   On: 04/20/2020 05:59   CT ABDOMEN PELVIS W CONTRAST  Result Date: 04/20/2020 CLINICAL DATA:  Epigastric pain with nausea and vomiting EXAM: CT ABDOMEN AND PELVIS WITH CONTRAST TECHNIQUE: Multidetector CT imaging of the abdomen and pelvis was performed using the standard protocol following bolus administration of intravenous contrast. CONTRAST:  95mL OMNIPAQUE IOHEXOL 300 MG/ML  SOLN COMPARISON:  05/06/2019 FINDINGS: Lower chest: No acute abnormality. Hepatobiliary: Gallbladder has been surgically removed. Biliary ductal dilatation is again noted and stable consistent with the post cholecystectomy state. Pancreas: Pancreas is well visualized. Small pancreatic head cyst is noted measuring 9.4 mm slightly increased when compared with the prior exam. Scattered smaller cysts are noted within  the body of the pancreas stable in appearance from the prior exam. Spleen: Normal in size without focal abnormality. Adrenals/Urinary Tract: Adrenal glands are within normal limits. Scattered cysts are noted throughout the kidneys. No renal calculi are noted. No obstructive changes are seen. The bladder is well distended. Stomach/Bowel: Scattered diverticular change of the colon is noted without evidence of  diverticulitis. The appendix has been surgically removed. Small bowel and stomach are within normal limits. Vascular/Lymphatic: Aortic atherosclerosis. No enlarged abdominal or pelvic lymph nodes. Reproductive: Status post hysterectomy. No adnexal masses. Other: No abdominal wall hernia or abnormality. No abdominopelvic ascites. Musculoskeletal: Degenerative changes of the lumbar spine are noted. Chronic fusion at L4-5 is noted. IMPRESSION: Status post cholecystectomy with compensatory biliary ductal dilatation stable from the prior exam. Cystic lesions within the pancreas and kidneys predominately stable in appearance from the prior exam although the cyst in the head of the pancreas has increased slightly from 8 to 9 mm. Follow-up pancreatic MRI in 1 year is recommended as previously described. Diverticulosis without diverticulitis. Electronically Signed   By: Marissa Catalina M.D.   On: 04/20/2020 11:07    Marissa Savoy, [name] @  04/21/2020, 11:36 AM   ________________________________________________________________________  [facility] [name] note:  I personally examined the patient, reviewed the data and agree with the assessment and plan described above. She has chronic nausea, vomiting that is probably worse lately.  Almost a failure to thrive situation with very limited PO intake for weeks it seems.  She rarely takes NSAIDs, denies narcotic pain med use but does take valium TID for a variety of MSK pains.  The cysts in her pancreas are not causing her GI symptoms.  I recommended repeating her EGD 0730 AM tomorrow (last was about 3 years ago) given the persistence of her symptoms, weight loss. Will also start a scopalamine transdermal patch given that other antiemetic options are very limited.   Owens Loffler, [name] [facility] Pager [phone]

## 2020-04-21 NOTE — Progress Notes (Signed)
I am stopping Lovenox SQ in preparation for EGD tomorrow.

## 2020-04-21 NOTE — Consult Note (Addendum)
Referring Provider:  [facility]         Primary Care Physician:  [name], MD Primary Gastroenterologist:  Zenovia Jarred, MD            We were asked to see this patient for:  Nausea, vomiting, pancreatic cysts.             ASSESSMENT / PLAN:    # 70 yo female admitted with COPD exacerbation, complains of excessive weight loss, chronic nausea,  vomiting and abdominal pain.   No weights in epic since Oct 2019 when patient was 110 pounds.  She is currently at 105 pounds now. Her symptoms are the same as when we evaluated her in 2019. Numerous CT scan, MRCP, EGD and colonoscopy haven't been diagnostic. She does have a known small pancreatic head cyst is measuring 9.4 mm on CT scan yesterday and slightly increased compared to scan one year ago. Smaller cysts are noted within the body of the pancreas stable in appearance from the prior exam.  Seems unlikely that these small cysts would be causing her chronic nausea, vomiting and abdominal pain unless she has underlying chronic pancreatitis?  Also wonder about delayed gastric emptying / bowel dysmotility related to her home medications. CT scan negative for any acute abnormalities.   --Anti-emetic regimen limited by her prolonged QTC. I don't know that one anti-emetic is  potentially less detrimental than the other. Scopolamine patch s an option but may be too sedating. She is already on chronic benzos which can sometimes be helpful with vomiting. Tricyclics can be helpful but maybe contraindicated given prolonged QTc --Dr. Ardis Hughs will cysts on CT scan but again they seem small to be causing all her pain and vomiting.    # COPD, being treated for exacerbation  # History of Barrett's esophagus diagnosed January 2019 --Due for surveillance EGD  # History of sessile serrated and adenomatous colon polyps January 2019.    HPI:                                                                                                                              Chief Complaint: nausea , vomiting abdominal pain and weight loss.   [name] is a 70 y.o. female with PMH significant for COPD with ongoing tobacco use, remote narcotic abuse, stroke, pneumococcal meningitis, hypertension, C. difficile colitis, chronic abdominal pain, pancreatic cysts, diverticulosis, cholecystectomy, hysterectomy, appendectomy, oophorectomy  Patient presented to the ED yesterday with shortness of breath and recent diagnosis of PNA by her PCP .  She also gave a history of weight loss,  nausea and vomiting and abdominal pain.     Patient's history of right sided abdominal pain, nausea and weight loss dates back to at least 2018 when she established care with [place].  Her liver chemistries were found in hospital to be elevated but MRCP negative. Suspected to have DILI from Tylenol use.   She underwent EGD and  colonoscopy (see below) in January 2019 which were unrevealing for source of symptoms but did show candida esophagitis, Barrett's esophagus and a few colon polyps. She has not followed up with [place] for financial reasons. Patient has had numerous CT scans through the years for evaluation of N/V and abdominal pain. Findings have mainly shown with stable pancreatic and renal cysts, mild PD dilation, large stool burden, gastric and duodenal thickening ( which was evaluated later by EGD with finding of reactive gastropathy). In 2018 she did have elevated and underwent MRI which showed upper normal intra/ extrahepatic biliary duct dilation without cause. Imaging since then has only suggest post cholecystectomy compensatory duct dilation.   Patient says the RUQ pain is nearly constant, worse after eating. The pain radiates around right side into her back.  She has a poor appetite and frequently vomits after meals. Emesis often contains food, it is non-bloody. She takes her BMs are generally normal 3 weeks out of the month ( solid daily BM) but one a month she gets " colitis"  with non-bloody diarrhea. Patient says that Dr. Royce Macadamia gives her Ativan three times a day for chronic abdominal pain ( valium is on home med list) and is also on subutex. Oxycodone on home med list but patient says she isn't taking it.   In the ED her liver test and lipase are normal.  Hemoglobin 15.8, hematocrit also elevated. WBC 13.5.  CT scan with contrast shows that she is status post cholecystectomy with compensatory biliary duct dilation (stable), cystic lesions in the pancreas and kidneys, stable in appearance except for the cyst in the head of the pancreas which has increased slightly by 1 mm, diverticulosis.      PREVIOUS ENDOSCOPIC EVALUATIONS / [place] STUDIES   Jan 2019 screening colonoscopy -One 7 mm polyp at the hepatic flexure, removed with a cold snare. Resected and retrieved. - One 4 mm polyp in the sigmoid colon, removed with a cold snare. Resected and retrieved. - One 10 mm polyp in the rectum, removed with a hot snare. Resected and retrieved. - Mild diverticulosis in the sigmoid colon. - External and internal hemorrhoids.  January 2019 EGD for abdominal pain, nausea and wall thickening of stomach on CT scan -Monilial esophagitis. - Z-line irregular, 35 cm from the incisors. Biopsied. - Gastritis. Biopsied. - Normal examined duodenum.  Diagnosis 1. Surgical [P], gastric antrum and gastric body - REACTIVE GASTROPATHY. - THERE IS NO EVIDENCE OF HELICOBACTER PYLORI, DYSPLASIA, OR MALIGNANCY. - SEE COMMENT. 2. Surgical [P], irregular z line, esophagus - INTESTINAL METAPLASIA (GOBLET CELL METAPLASIA) CONSISTENT WITH BARRETT'S ESOPHAGUS. - THERE IS NO EVIDENCE OF DYSPLASIA OR MALIGNANCY. 3. Surgical [P], hepatic flexure, polyp, colon - SESSILE SERRATED POLYP WITHOUT CYTOLOGIC DYSPLASIA. 4. Surgical [P], sigmoid, polyp - SESSILE SERRATED POLYP WITHOUT CYTOLOGIC DYSPLASIA. 5. Surgical [P], rectum, polyp - TUBULAR ADENOMA(S). - HIGH GRADE DYSPLASIA IS NOT  IDENTIFIED. Microscopic Comment 1. This pattern can be associated with non-steroidal anti-inflammatory drugs (NSAIDS), alcohol or with other causes of chemical gastropathy. Helicobacter pylori are not identified with Warthin-Starry stain.    Past Medical History:  Diagnosis Date  . Acute blood loss anemia   . Arrhythmia   . Arthritis   . C. difficile colitis   . Cancer ([place])   . Diverticulosis   . Fibromyalgia   . GERD (gastroesophageal reflux disease)   . GI hemorrhage   . H/O: substance abuse ([place]) 2005   narcotic usage due to chronic back pain  . Heart murmur   .  Hypertension   . Pancreatic cyst   . Pneumococcal meningitis   . PONV (postoperative nausea and vomiting)   . Renal cyst   . Stroke (cerebrum) [facility])     Past Surgical History:  Procedure Laterality Date  . ABDOMINAL HYSTERECTOMY    . ABDOMINAL SURGERY    . APPENDECTOMY    . BREAST SURGERY    . CHOLECYSTECTOMY N/A 09/30/2012   Procedure: LAPAROSCOPIC CHOLECYSTECTOMY WITH INTRAOPERATIVE CHOLANGIOGRAM;  Surgeon: Imogene Burn. Georgette Dover, MD;  Location: [facility];  Service: General;  Laterality: N/A;  . FRACTURE SURGERY     right upper arm  . OVARIAN CYST SURGERY      Prior to Admission medications   Medication Sig Start Date End Date Taking? Authorizing Provider  buprenorphine (SUBUTEX) 8 MG SUBL SL tablet Place 8 mg under the tongue daily. 04/15/20  Yes [provider]  carvedilol (COREG) 6.25 MG tablet Take 6.25 mg by mouth 3 (three) times daily.  12/25/17  Yes [provider]  diazepam (VALIUM) 2 MG tablet Take 2 mg by mouth 4 (four) times daily as needed for anxiety. 04/02/20  Yes [provider]  pantoprazole (PROTONIX) 40 MG tablet Take 1 tablet (40 mg total) by mouth daily. 04/01/17  Yes Pyrtle, Lajuan Lines, MD  polyethylene glycol [facility]) packet Take 17 g by mouth daily. 01/04/18  Yes Isla Pence, MD  promethazine (PHENERGAN) 25 MG tablet Take 25 mg by mouth every 4 (four) hours as needed  for nausea or vomiting. 04/10/19  Yes [provider]  simethicone (MYLICON) 151 MG chewable tablet Chew 125 mg by mouth every 6 (six) hours as needed for flatulence.   Yes [provider]  temazepam (RESTORIL) 15 MG capsule Take 15 mg by mouth at bedtime.  12/02/17  Yes [provider]  budesonide-formoterol (SYMBICORT) 80-4.5 MCG/ACT inhaler Take 2 puffs first thing in am and then another 2 puffs about 12 hours later. Patient not taking: Reported on 04/20/2020 06/19/17   Tanda Rockers, MD  carisoprodol (SOMA) 350 MG tablet TAKE 1 TABLET BY MOUTH UP TO THREE TIMES DAILY AS NEEDED FOR MUSCLE SPASMS Patient not taking: No sig reported 03/19/17   [name], MD  ondansetron (ZOFRAN ODT) 4 MG disintegrating tablet Take 1 tablet (4 mg total) by mouth every 8 (eight) hours as needed. Patient not taking: No sig reported 01/04/18   Isla Pence, MD  ondansetron (ZOFRAN) 4 MG tablet Take 1 tablet (4 mg total) by mouth every 6 (six) hours as needed for nausea or vomiting. Patient not taking: No sig reported 02/25/17   Pyrtle, Lajuan Lines, MD  ondansetron (ZOFRAN) 4 MG tablet Take 1 tablet (4 mg total) by mouth every 8 (eight) hours as needed. Patient not taking: No sig reported 05/06/19   Tegeler, Gwenyth Allegra, MD  oxyCODONE (ROXICODONE) 5 MG immediate release tablet Take 1 tablet (5 mg total) by mouth every 4 (four) hours as needed for severe pain. Patient not taking: No sig reported 05/06/19   Tegeler, Gwenyth Allegra, MD    Current Facility-Administered Medications  Medication Dose Route Frequency Provider Last Rate Last Admin  . acetaminophen (TYLENOL) tablet 650 mg  650 mg Oral Q6H PRN Harold Hedge, MD       Or  . acetaminophen (TYLENOL) suppository 650 mg  650 mg Rectal Q6H PRN Harold Hedge, MD      . albuterol (PROVENTIL) (2.5 MG/3ML) 0.083% nebulizer solution 2.5 mg  2.5 mg Nebulization Q2H PRN Neysa Bonito,  Chauncy Passy, MD      . buprenorphine-naloxone (SUBOXONE) 8-2 mg per SL  tablet 1 tablet  1 tablet Sublingual Daily Harold Hedge, MD   1 tablet at 04/21/20 0947  . carvedilol (COREG) tablet 6.25 mg  6.25 mg Oral TID Harold Hedge, MD   6.25 mg at 04/21/20 0947  . doxycycline (VIBRAMYCIN) 100 mg in sodium chloride 0.9 % 250 mL IVPB  100 mg Intravenous Q12H Harold Hedge, MD   Stopped at 04/21/20 [phone]  . enoxaparin (LOVENOX) injection 40 mg  40 mg Subcutaneous Q24H Harold Hedge, MD   40 mg at 04/20/20 1719  . famotidine (PEPCID) IVPB 20 mg premix  20 mg Intravenous Daily Harold Hedge, MD   Stopped at 04/21/20 1001  . ipratropium-albuterol (DUONEB) 0.5-2.5 (3) MG/3ML nebulizer solution 3 mL  3 mL Nebulization BID Hongalgi, Anand D, MD      . ketorolac (TORADOL) 15 MG/ML injection 15 mg  15 mg Intravenous Once Harold Hedge, MD      . LORazepam (ATIVAN) injection 1 mg  1 mg Intravenous Q6H Modena Jansky, MD   1 mg at 04/21/20 0807  . mometasone-formoterol (DULERA) 100-5 MCG/ACT inhaler 2 puff  2 puff Inhalation BID Harold Hedge, MD   2 puff at 04/21/20 [phone]  . ondansetron (ZOFRAN) injection 4 mg  4 mg Intravenous Q8H PRN Modena Jansky, MD   4 mg at 04/21/20 0953  . polyethylene glycol (MIRALAX / GLYCOLAX) packet 17 g  17 g Oral Daily PRN Harold Hedge, MD   17 g at 04/21/20 0948  . [START ON 04/22/2020] predniSONE (DELTASONE) tablet 40 mg  40 mg Oral Q breakfast Marva Panda E, MD      . sodium chloride flush (NS) 0.9 % injection 3 mL  3 mL Intravenous Q12H Harold Hedge, MD   3 mL at 04/21/20 0954  . temazepam (RESTORIL) capsule 15 mg  15 mg Oral QHS Harold Hedge, MD   15 mg at 04/20/20 2216   Current Outpatient Medications  Medication Sig Dispense Refill  . buprenorphine (SUBUTEX) 8 MG SUBL SL tablet Place 8 mg under the tongue daily.    . carvedilol (COREG) 6.25 MG tablet Take 6.25 mg by mouth 3 (three) times daily.   6  . diazepam (VALIUM) 2 MG tablet Take 2 mg by mouth 4 (four) times daily as needed for anxiety.    . pantoprazole (PROTONIX) 40  MG tablet Take 1 tablet (40 mg total) by mouth daily. 90 tablet 3  . polyethylene glycol (MIRALAX) packet Take 17 g by mouth daily. 14 each 0  . promethazine (PHENERGAN) 25 MG tablet Take 25 mg by mouth every 4 (four) hours as needed for nausea or vomiting.    . simethicone (MYLICON) 517 MG chewable tablet Chew 125 mg by mouth every 6 (six) hours as needed for flatulence.    . temazepam (RESTORIL) 15 MG capsule Take 15 mg by mouth at bedtime.   0  . budesonide-formoterol (SYMBICORT) 80-4.5 MCG/ACT inhaler Take 2 puffs first thing in am and then another 2 puffs about 12 hours later. (Patient not taking: Reported on 04/20/2020) 1 Inhaler 11  . carisoprodol (SOMA) 350 MG tablet TAKE 1 TABLET BY MOUTH UP TO THREE TIMES DAILY AS NEEDED FOR MUSCLE SPASMS (Patient not taking: No sig reported) 30 tablet 0  . ondansetron (ZOFRAN ODT) 4 MG disintegrating tablet Take 1 tablet (4 mg total) by mouth  every 8 (eight) hours as needed. (Patient not taking: No sig reported) 10 tablet 0  . ondansetron (ZOFRAN) 4 MG tablet Take 1 tablet (4 mg total) by mouth every 6 (six) hours as needed for nausea or vomiting. (Patient not taking: No sig reported) 120 tablet 2  . ondansetron (ZOFRAN) 4 MG tablet Take 1 tablet (4 mg total) by mouth every 8 (eight) hours as needed. (Patient not taking: No sig reported) 12 tablet 0  . oxyCODONE (ROXICODONE) 5 MG immediate release tablet Take 1 tablet (5 mg total) by mouth every 4 (four) hours as needed for severe pain. (Patient not taking: No sig reported) 6 tablet 0    Allergies as of 04/20/2020 - Review Complete 04/20/2020  Allergen Reaction Noted  . Tylenol [acetaminophen] Other (See Comments) 11/13/2016  . Codeine Nausea And Vomiting 08/19/2012  . Nsaids Other (See Comments) 08/19/2012  . Tolmetin Other (See Comments) 08/19/2012  . Tramadol  06/06/2017    Family History  Problem Relation Age of Onset  . Alzheimer's disease Mother   . Heart disease Father   . Prostate cancer  Father     Social History   Socioeconomic History  . Marital status: Married    Spouse name: Not on file  . Number of children: Not on file  . Years of education: Not on file  . Highest education level: Not on file  Occupational History  . Not on file  Tobacco Use  . Smoking status: Current Every Day Smoker    Packs/day: 0.75    Years: 49.00    Pack years: 36.75    Types: Cigarettes  . Smokeless tobacco: Never Used  Vaping Use  . Vaping Use: Never used  Substance and Sexual Activity  . Alcohol use: No  . Drug use: No    Comment: Methadone for narcotic use  . Sexual activity: Not Currently  Other Topics Concern  . Not on file  Social History Narrative  . Not on file   Social Determinants of Health   Financial Resource Strain: Not on file  Food Insecurity: Not on file  Transportation Needs: Not on file  Physical Activity: Not on file  Stress: Not on file  Social Connections: Not on file  Intimate Partner Violence: Not on file    Review of Systems: All systems reviewed and negative except where noted in HPI.  OBJECTIVE:    Physical Exam: Vital signs in last 24 hours: Pulse Rate:  [64-92] 64 (02/11 1100) Resp:  [9-20] 10 (02/11 1100) BP: (90-154)/(53-95) 113/62 (02/11 1100) SpO2:  [84 %-98 %] 91 % (02/11 1100)   General:   Alert  Thin female in NAD Psych:  Pleasant, cooperative. Normal mood and affect. Eyes:  Pupils equal, sclera clear, no icterus.   Conjunctiva pink. Ears:  Normal auditory acuity. Nose:  No deformity, discharge,  or lesions. Neck:  Supple; no masses Lungs:  Clear throughout to auscultation.   No wheezes, crackles, or rhonchi.  Heart:  Regular rate and rhythm; no murmurs, no lower extremity edema Abdomen:  Soft, non-distended, nontender, BS active, no palp mass   Rectal:  Deferred  Msk:  Symmetrical without gross deformities. . Neurologic:  Alert and  oriented x4;  grossly normal neurologically. Skin:  Intact without significant lesions  or rashes.  Filed Weights   04/20/20 0502  Weight: 47.6 kg     Scheduled inpatient medications . buprenorphine-naloxone  1 tablet Sublingual Daily  . carvedilol  6.25 mg Oral TID  .  enoxaparin (LOVENOX) injection  40 mg Subcutaneous Q24H  . ipratropium-albuterol  3 mL Nebulization BID  . ketorolac  15 mg Intravenous Once  . LORazepam  1 mg Intravenous Q6H  . mometasone-formoterol  2 puff Inhalation BID  . [START ON 04/22/2020] predniSONE  40 mg Oral Q breakfast  . sodium chloride flush  3 mL Intravenous Q12H  . temazepam  15 mg Oral QHS      Intake/Output from previous day: 02/10 0701 - 02/11 0700 In: 250 [IV Piggyback:250] Out: -  Intake/Output this shift: No intake/output data recorded.   Lab Results: Recent Labs    04/20/20 0827 04/21/20 0358  WBC 13.5* 11.7*  HGB 15.8* 12.5  HCT 46.1* 37.8  PLT 361 259   BMET Recent Labs    04/20/20 0827 04/21/20 0358  NA 141 140  K 4.0 3.8  CL 101 104  CO2 27 28  GLUCOSE 141* 125*  BUN 19 26*  CREATININE 0.61 0.59  CALCIUM 10.3 9.4   LFT Recent Labs    04/20/20 0827  PROT 8.2*  ALBUMIN 4.8  AST 21  ALT 15  ALKPHOS 85  BILITOT 0.7   . CBC Latest Ref Rng & Units 04/21/2020 04/20/2020 05/06/2019  WBC 4.0 - 10.5 K/uL 11.7(H) 13.5(H) 15.7(H)  Hemoglobin 12.0 - 15.0 g/dL 12.5 15.8(H) 16.1(H)  Hematocrit 36.0 - 46.0 % 37.8 46.1(H) 46.9(H)  Platelets 150 - 400 K/uL 259 361 408(H)    . CMP Latest Ref Rng & Units 04/21/2020 04/20/2020 05/06/2019  Glucose 70 - 99 mg/dL 125(H) 141(H) 129(H)  BUN 8 - 23 mg/dL 26(H) 19 22  Creatinine 0.44 - 1.00 mg/dL 0.59 0.61 1.01(H)  Sodium 135 - 145 mmol/L 140 141 140  Potassium 3.5 - 5.1 mmol/L 3.8 4.0 2.9(L)  Chloride 98 - 111 mmol/L 104 101 103  CO2 22 - 32 mmol/L 28 27 24   Calcium 8.9 - 10.3 mg/dL 9.4 10.3 9.8  Total Protein 6.5 - 8.1 g/dL - 8.2(H) 7.3  Total Bilirubin 0.3 - 1.2 mg/dL - 0.7 0.8  Alkaline Phos 38 - 126 U/L - 85 62  AST 15 - 41 U/L - 21 19  ALT 0 - 44 U/L  - 15 18   Studies/Results: DG Chest 2 View  Result Date: 04/20/2020 CLINICAL DATA:  70 year old female with increasing shortness of breath for 24 hours. EXAM: CHEST - 2 VIEW COMPARISON:  Chest radiographs 01/04/2018 and earlier. FINDINGS: Pulmonary hyperinflation is chronic, but may have progressed since 2019. Mediastinal contours remain normal. Visualized tracheal air column is within normal limits. No pneumothorax, pulmonary edema, pleural effusion or acute pulmonary opacity. Incidental nipple shadows. Osteopenia. No acute osseous abnormality identified. Negative visible bowel gas pattern. IMPRESSION: Chronic pulmonary hyperinflation. No acute cardiopulmonary abnormality. Electronically Signed   By: Genevie Ann M.D.   On: 04/20/2020 05:59   CT ABDOMEN PELVIS W CONTRAST  Result Date: 04/20/2020 CLINICAL DATA:  Epigastric pain with nausea and vomiting EXAM: CT ABDOMEN AND PELVIS WITH CONTRAST TECHNIQUE: Multidetector CT imaging of the abdomen and pelvis was performed using the standard protocol following bolus administration of intravenous contrast. CONTRAST:  30mL OMNIPAQUE IOHEXOL 300 MG/ML  SOLN COMPARISON:  05/06/2019 FINDINGS: Lower chest: No acute abnormality. Hepatobiliary: Gallbladder has been surgically removed. Biliary ductal dilatation is again noted and stable consistent with the post cholecystectomy state. Pancreas: Pancreas is well visualized. Small pancreatic head cyst is noted measuring 9.4 mm slightly increased when compared with the prior exam. Scattered smaller cysts are noted within  the body of the pancreas stable in appearance from the prior exam. Spleen: Normal in size without focal abnormality. Adrenals/Urinary Tract: Adrenal glands are within normal limits. Scattered cysts are noted throughout the kidneys. No renal calculi are noted. No obstructive changes are seen. The bladder is well distended. Stomach/Bowel: Scattered diverticular change of the colon is noted without evidence of  diverticulitis. The appendix has been surgically removed. Small bowel and stomach are within normal limits. Vascular/Lymphatic: Aortic atherosclerosis. No enlarged abdominal or pelvic lymph nodes. Reproductive: Status post hysterectomy. No adnexal masses. Other: No abdominal wall hernia or abnormality. No abdominopelvic ascites. Musculoskeletal: Degenerative changes of the lumbar spine are noted. Chronic fusion at L4-5 is noted. IMPRESSION: Status post cholecystectomy with compensatory biliary ductal dilatation stable from the prior exam. Cystic lesions within the pancreas and kidneys predominately stable in appearance from the prior exam although the cyst in the head of the pancreas has increased slightly from 8 to 9 mm. Follow-up pancreatic MRI in 1 year is recommended as previously described. Diverticulosis without diverticulitis. Electronically Signed   By: Inez Catalina M.D.   On: 04/20/2020 11:07    Tye Savoy, NP-C @  04/21/2020, 11:36 AM   ________________________________________________________________________  Velora Heckler GI MD note:  I personally examined the patient, reviewed the data and agree with the assessment and plan described above. She has chronic nausea, vomiting that is probably worse lately.  Almost a failure to thrive situation with very limited PO intake for weeks it seems.  She rarely takes NSAIDs, denies narcotic pain med use but does take valium TID for a variety of MSK pains.  The cysts in her pancreas are not causing her GI symptoms.  I recommended repeating her EGD 0730 AM tomorrow (last was about 3 years ago) given the persistence of her symptoms, weight loss. Will also start a scopalamine transdermal patch given that other antiemetic options are very limited.   Owens Loffler, MD [facility] Pager [phone]

## 2020-04-21 NOTE — Progress Notes (Addendum)
PROGRESS NOTE   [name]  CNO:[id number]    DOB: [birth year]    DOA: 04/20/2020  PCP: [name], MD   I have briefly reviewed patients previous medical records in [facility].  Chief Complaint  Patient presents with  . Shortness of Breath    Brief Narrative:  70 year old female with medical history of COPD, not on home oxygen, ongoing tobacco abuse, fibromyalgia, substance abuse, anxiety on benzodiazepines, s/p cholecystectomy, bacterial meningitis, GERD, stroke, hypertension, chronic abdominal pain of unclear etiology, on buprenorphine through PCP, pancreatic cysts presented to the ED with complaints of subacute on chronic abdominal pain, intractable nausea and vomiting for 3 to 4 days PTA complicating chronic vomiting up to 3-4 times per week, poor oral intake/unable to keep things down, reports significant weight loss and worsening dyspnea.  Seen by PCP day prior to ED visit, given a steroid injection and antibiotic injection in the office and an antibiotic prescription but unable to keep things down.  Admitted for acute hypoxic respiratory failure due to COPD exacerbation, intractable nausea and vomiting, subacute on chronic abdominal pain of unclear etiology-likely multifactorial.  [facility] consulted.   Assessment & Plan:  Principal Problem:   COPD with acute exacerbation ([place]) Active Problems:   Anxiety   GERD (gastroesophageal reflux disease)   Essential hypertension   Acute respiratory failure (HCC)   Chronic pain syndrome   Shortness of breath   Chronic diastolic CHF (congestive heart failure) (HCC)   Nausea & vomiting   Prolonged QT interval   Acute respiratory failure with hypoxia due to COPD exacerbation:  In the ED developed hypoxia with oxygen saturation in the mid to high 80s.  Suspect viral etiology and noncompliance to home pulmonary regimen due to insurance issues i.e. Symbicort and other inhalers.  SARS coronavirus 2 -, RSV negative,  chest x-ray with chronic pulmonary hyperinflation and no acute abnormalities.  BNP 83.  Troponins negative.  Lactate normal.  Continue scheduled DuoNebs and as needed albuterol nebs, Dulera and consider stopping doxycycline due to negative procalcitonin and low index of infectious etiology.  Continue Pepcid for possible GERD.  TOC consultation for medication assistance upon discharge.  Tobacco cessation counseled.  Recommend outpatient pulmonology consultation and follow-up.  Added incentive spirometry.  Intractable nausea and vomiting/poor oral intake with failure to thrive, weight loss  Unclear etiology.  Suspect multifactorial.  DD: Less likely related to small pancreatic cyst noted on repeat CT even though they are slightly larger than prior CT in February, could she have chronic pancreatitis?,  Gastroparesis-may be complicated by pain medicines, other etiologies but extensive prior work-up by GI not revealing.  Continue IV PPI, IV Zofran as needed with close monitoring of EKG for QTC prolongation  EKG this morning had QTC of about 450 ms.   GI consulted for assistance.  Patient tolerated liquid diet and asking for advancing to soft diet-monitor.  Pancreatic cysts/possible chronic pancreatitis  Has prior history of same.  Repeat CT abdomen shows slightly enlarged cyst compared to prior CT in February.  However do not seem large enough for intervention i.e. aspiration etc.  Await GI input.  Follow-up pancreatic MRI in 1 year as per radiology recommend  Subacute on chronic abdominal pain  Managed by her PCP at [facility].  Has not seen pain management.  Recommended outpatient pain management consult.  On buprenorphine PTA, now on Suboxone.  Judicious use of opioids.  As needed IV Toradol.  Anxiety disorder  On scheduled 4  times daily Valium PTA.  Now that she is tolerating p.o., transition IV Ativan to home dose of Valium and  Restoril.  Prolonged QTC  EKG 2/11 with normal QTC.  Follow EKG periodically and minimize QT prolonging medications.  GERD:  Change IV Pepcid to oral PPI.  Leukocytosis  Possibly steroid-induced due to recent IM steroids.  Low index of clinical suspicion for sepsis.  Follow CBC.  Chronic diastolic CHF  Compensated.  Continue Coreg.  Essential hypertension:  Controlled.  Continue Coreg.  Tobacco abuse  Reports that she smokes 5 cigarettes/day.  Cessation counseled.  Declines nicotine patch.  Body mass index is 18.02 kg/m.    DVT prophylaxis: enoxaparin (LOVENOX) injection 40 mg Start: 04/20/20 1800     Code Status: DNR Family Communication: None Disposition:  Status is: Inpatient  Remains inpatient appropriate because:Inpatient level of care appropriate due to severity of illness   Dispo: The patient is from: Home              Anticipated d/c is to: Home              Anticipated d/c date is: 2 days              Patient currently is not medically stable to d/c.   Difficult to place patient No        Consultants:   [facility]  Procedures:   None  Antimicrobials:    Anti-infectives (From admission, onward)   Start     Dose/Rate Route Frequency Ordered Stop   04/20/20 1700  doxycycline (VIBRAMYCIN) 100 mg in sodium chloride 0.9 % 250 mL IVPB        100 mg 125 mL/hr over 120 Minutes Intravenous Every 12 hours 04/20/20 1651     04/20/20 1630  azithromycin (ZITHROMAX) 500 mg in sodium chloride 0.9 % 250 mL IVPB  Status:  Discontinued        500 mg 250 mL/hr over 60 Minutes Intravenous Every 24 hours 04/20/20 1615 04/20/20 1651        Subjective:  Seen this morning while still in ED.  Reports that her dyspnea has substantially improved and breathing may be close to baseline.  No chest pain or cough reported.  No fever or chills.  Ongoing chronic generalized abdominal pain.  Per RN report, tolerated liquid diet and then asking for solid  food.  Objective:   Vitals:   04/21/20 1200 04/21/20 1230 04/21/20 1300 04/21/20 1330  BP: 128/66 121/89 124/71 (!) 117/59  Pulse: 63 76 76 66  Resp: 11 18    Temp:      TempSrc:      SpO2: 93% 94% 94% 91%  Weight:      Height:        General exam: Middle-age female, small built, chronically ill and cachectic looking lying comfortably propped up in bed.  Oral mucosa with borderline hydration. Respiratory system: Slightly harsh breath sounds bilaterally with occasional rhonchi.  No crackles.  No increased work of breathing. Cardiovascular system: S1 & S2 heard, RRR. No JVD, murmurs, rubs, gallops or clicks. No pedal edema. Gastrointestinal system: Abdomen is nondistended, soft and nontender. No organomegaly or masses felt. Normal bowel sounds heard. Central nervous system: Alert and oriented. No focal neurological deficits. Extremities: Symmetric 5 x 5 power. Skin: No rashes, lesions or ulcers Psychiatry: Judgement and insight appear normal. Mood & affect appropriate.     Data Reviewed:   I have personally reviewed following labs and imaging  studies   CBC: Recent Labs  Lab 04/20/20 0827 04/21/20 0358  WBC 13.5* 11.7*  HGB 15.8* 12.5  HCT 46.1* 37.8  MCV 89.7 93.3  PLT 361 150    Basic Metabolic Panel: Recent Labs  Lab 04/20/20 0827 04/21/20 0358  NA 141 140  K 4.0 3.8  CL 101 104  CO2 27 28  GLUCOSE 141* 125*  BUN 19 26*  CREATININE 0.61 0.59  CALCIUM 10.3 9.4    Liver Function Tests: Recent Labs  Lab 04/20/20 0827  AST 21  ALT 15  ALKPHOS 85  BILITOT 0.7  PROT 8.2*  ALBUMIN 4.8    CBG: No results for input(s): GLUCAP in the last 168 hours.  Microbiology Studies:   Recent Results (from the past 240 hour(s))  SARS CORONAVIRUS 2 (TAT 6-24 HRS) Nasopharyngeal Nasopharyngeal Swab     Status: None   Collection Time: 04/20/20  8:27 AM   Specimen: Nasopharyngeal Swab  Result Value Ref Range Status   SARS Coronavirus 2 NEGATIVE NEGATIVE Final     Comment: (NOTE) SARS-CoV-2 target nucleic acids are NOT DETECTED.  The SARS-CoV-2 RNA is generally detectable in upper and lower respiratory specimens during the acute phase of infection. Negative results do not preclude SARS-CoV-2 infection, do not rule out co-infections with other pathogens, and should not be used as the sole basis for treatment or other patient management decisions. Negative results must be combined with clinical observations, patient history, and epidemiological information. The expected result is Negative.  Fact Sheet for Patients: SugarRoll.be  Fact Sheet for Healthcare Providers: https://www.woods-mathews.com/  This test is not yet approved or cleared by the [place] FDA and  has been authorized for detection and/or diagnosis of SARS-CoV-2 by FDA under an Emergency Use Authorization (EUA). This EUA will remain  in effect (meaning this test can be used) for the duration of the COVID-19 declaration under Se ction 564(b)(1) of the Act, 21 U.S.C. section 360bbb-3(b)(1), unless the authorization is terminated or revoked sooner.  Performed at [facility], [place] [address] [place], [place] [address]   Respiratory (~20 pathogens) panel by PCR     Status: None   Collection Time: 04/20/20  6:00 PM   Specimen: Nasopharyngeal Swab; Respiratory  Result Value Ref Range Status   Adenovirus NOT DETECTED NOT DETECTED Final   Coronavirus 229E NOT DETECTED NOT DETECTED Final    Comment: (NOTE) The Coronavirus on the Respiratory Panel, DOES NOT test for the novel  Coronavirus (2019 nCoV)    Coronavirus HKU1 NOT DETECTED NOT DETECTED Final   Coronavirus NL63 NOT DETECTED NOT DETECTED Final   Coronavirus OC43 NOT DETECTED NOT DETECTED Final   Metapneumovirus NOT DETECTED NOT DETECTED Final   Rhinovirus / Enterovirus NOT DETECTED NOT DETECTED Final   Influenza A NOT DETECTED NOT DETECTED Final   Influenza B NOT DETECTED  NOT DETECTED Final   Parainfluenza Virus 1 NOT DETECTED NOT DETECTED Final   Parainfluenza Virus 2 NOT DETECTED NOT DETECTED Final   Parainfluenza Virus 3 NOT DETECTED NOT DETECTED Final   Parainfluenza Virus 4 NOT DETECTED NOT DETECTED Final   Respiratory Syncytial Virus NOT DETECTED NOT DETECTED Final   Bordetella pertussis NOT DETECTED NOT DETECTED Final   Bordetella Parapertussis NOT DETECTED NOT DETECTED Final   Chlamydophila pneumoniae NOT DETECTED NOT DETECTED Final   Mycoplasma pneumoniae NOT DETECTED NOT DETECTED Final    Comment: Performed at [facility], [place]. [address], [place], [place] [address]  Radiology Studies:  DG Chest 2 View  Result Date: 04/20/2020 CLINICAL DATA:  70 year old female with increasing shortness of breath for 24 hours. EXAM: CHEST - 2 VIEW COMPARISON:  Chest radiographs 01/04/2018 and earlier. FINDINGS: Pulmonary hyperinflation is chronic, but may have progressed since 2019. Mediastinal contours remain normal. Visualized tracheal air column is within normal limits. No pneumothorax, pulmonary edema, pleural effusion or acute pulmonary opacity. Incidental nipple shadows. Osteopenia. No acute osseous abnormality identified. Negative visible bowel gas pattern. IMPRESSION: Chronic pulmonary hyperinflation. No acute cardiopulmonary abnormality. Electronically Signed   By: Genevie Ann M.D.   On: 04/20/2020 05:59   CT ABDOMEN PELVIS W CONTRAST  Result Date: 04/20/2020 CLINICAL DATA:  Epigastric pain with nausea and vomiting EXAM: CT ABDOMEN AND PELVIS WITH CONTRAST TECHNIQUE: Multidetector CT imaging of the abdomen and pelvis was performed using the standard protocol following bolus administration of intravenous contrast. CONTRAST:  89mL OMNIPAQUE IOHEXOL 300 MG/ML  SOLN COMPARISON:  05/06/2019 FINDINGS: Lower chest: No acute abnormality. Hepatobiliary: Gallbladder has been surgically removed. Biliary ductal dilatation is again noted and stable consistent with the  post cholecystectomy state. Pancreas: Pancreas is well visualized. Small pancreatic head cyst is noted measuring 9.4 mm slightly increased when compared with the prior exam. Scattered smaller cysts are noted within the body of the pancreas stable in appearance from the prior exam. Spleen: Normal in size without focal abnormality. Adrenals/Urinary Tract: Adrenal glands are within normal limits. Scattered cysts are noted throughout the kidneys. No renal calculi are noted. No obstructive changes are seen. The bladder is well distended. Stomach/Bowel: Scattered diverticular change of the colon is noted without evidence of diverticulitis. The appendix has been surgically removed. Small bowel and stomach are within normal limits. Vascular/Lymphatic: Aortic atherosclerosis. No enlarged abdominal or pelvic lymph nodes. Reproductive: Status post hysterectomy. No adnexal masses. Other: No abdominal wall hernia or abnormality. No abdominopelvic ascites. Musculoskeletal: Degenerative changes of the lumbar spine are noted. Chronic fusion at L4-5 is noted. IMPRESSION: Status post cholecystectomy with compensatory biliary ductal dilatation stable from the prior exam. Cystic lesions within the pancreas and kidneys predominately stable in appearance from the prior exam although the cyst in the head of the pancreas has increased slightly from 8 to 9 mm. Follow-up pancreatic MRI in 1 year is recommended as previously described. Diverticulosis without diverticulitis. Electronically Signed   By: Inez Catalina M.D.   On: 04/20/2020 11:07     Scheduled Meds:   . buprenorphine-naloxone  1 tablet Sublingual Daily  . carvedilol  6.25 mg Oral TID  . enoxaparin (LOVENOX) injection  40 mg Subcutaneous Q24H  . ipratropium-albuterol  3 mL Nebulization BID  . ketorolac  15 mg Intravenous Once  . LORazepam  1 mg Intravenous Q6H  . mometasone-formoterol  2 puff Inhalation BID  . [START ON 04/22/2020] predniSONE  40 mg Oral Q breakfast  .  sodium chloride flush  3 mL Intravenous Q12H  . temazepam  15 mg Oral QHS    Continuous Infusions:   . doxycycline (VIBRAMYCIN) IV Stopped (04/21/20 1062)  . famotidine (PEPCID) IV Stopped (04/21/20 1001)     LOS: 1 day     Vernell Leep, MD, [place], [facility]. [facility]    To contact the attending provider between 7A-7P or the covering provider during after hours 7P-7A, please log into the web site [email or web address] and access using universal [place] password for that web site. If you do not have the password, please call the hospital operator.  04/21/2020, 2:29 PM

## 2020-04-21 NOTE — ED Notes (Addendum)
Pt requesting ativan and nausea medication. Reports 8/10 right flank pain.

## 2020-04-22 ENCOUNTER — Encounter (HOSPITAL_COMMUNITY): Payer: Self-pay | Admitting: Internal Medicine

## 2020-04-22 ENCOUNTER — Encounter (HOSPITAL_COMMUNITY)
Admission: EM | Disposition: A | Discharge status: Home / Self Care | Payer: Self-pay | Source: Home / Self Care | Attending: Internal Medicine

## 2020-04-22 ENCOUNTER — Inpatient Hospital Stay (HOSPITAL_COMMUNITY): Payer: Medicare Other | Admitting: Registered Nurse

## 2020-04-22 DIAGNOSIS — K2970 Gastritis, unspecified, without bleeding: Secondary | ICD-10-CM | POA: Diagnosis not present

## 2020-04-22 DIAGNOSIS — J9601 Acute respiratory failure with hypoxia: Secondary | ICD-10-CM | POA: Diagnosis not present

## 2020-04-22 DIAGNOSIS — G894 Chronic pain syndrome: Secondary | ICD-10-CM | POA: Diagnosis not present

## 2020-04-22 DIAGNOSIS — J441 Chronic obstructive pulmonary disease with (acute) exacerbation: Secondary | ICD-10-CM | POA: Diagnosis not present

## 2020-04-22 HISTORY — PX: BIOPSY: SHX5522

## 2020-04-22 HISTORY — PX: ESOPHAGOGASTRODUODENOSCOPY (EGD) WITH PROPOFOL: SHX5813

## 2020-04-22 LAB — BASIC METABOLIC PANEL
Anion gap: 7 (ref 5–15)
BUN: 36 mg/dL — ABNORMAL HIGH (ref 8–23)
CO2: 29 mmol/L (ref 22–32)
Calcium: 9.4 mg/dL (ref 8.9–10.3)
Chloride: 104 mmol/L (ref 98–111)
Creatinine, Ser: 0.68 mg/dL (ref 0.44–1.00)
GFR, Estimated: >60 mL/min (ref >60)
Glucose, Bld: 94 mg/dL (ref 70–99)
Potassium: 3.7 mmol/L (ref 3.5–5.1)
Sodium: 140 mmol/L (ref 135–145)

## 2020-04-22 LAB — CBC
HCT: 36.6 % (ref 36.0–46.0)
Hemoglobin: 11.9 g/dL — ABNORMAL LOW (ref 12.0–15.0)
MCH: 30.7 pg (ref 26.0–34.0)
MCHC: 32.5 g/dL (ref 30.0–36.0)
MCV: 94.3 fL (ref 80.0–100.0)
Platelets: 268 10*3/uL (ref 150–400)
RBC: 3.88 MIL/uL (ref 3.87–5.11)
RDW: 13.7 % (ref 11.5–15.5)
WBC: 14.5 10*3/uL — ABNORMAL HIGH (ref 4.0–10.5)
nRBC: 0 % (ref 0.0–0.2)

## 2020-04-22 SURGERY — ESOPHAGOGASTRODUODENOSCOPY (EGD) WITH PROPOFOL
Anesthesia: Monitor Anesthesia Care

## 2020-04-22 MED ORDER — GLYCOPYRROLATE 0.2 MG/ML IJ SOLN
INTRAMUSCULAR | Status: DC | PRN
  Administered 2020-04-22: .1 mg via INTRAVENOUS

## 2020-04-22 MED ORDER — MOMETASONE FURO-FORMOTEROL FUM 100-5 MCG/ACT IN AERO: 2.0000 | INHALATION_SPRAY | Freq: Two times a day (BID) | RESPIRATORY_TRACT | 0 refills | Status: DC

## 2020-04-22 MED ORDER — LIDOCAINE HCL (CARDIAC) PF 100 MG/5ML IV SOSY
PREFILLED_SYRINGE | INTRAVENOUS | Status: DC | PRN
  Administered 2020-04-22: 50 mg via INTRAVENOUS

## 2020-04-22 MED ORDER — DEXAMETHASONE SODIUM PHOSPHATE 10 MG/ML IJ SOLN
INTRAMUSCULAR | Status: DC | PRN
  Administered 2020-04-22: 4 mg via INTRAVENOUS

## 2020-04-22 MED ORDER — LACTATED RINGERS IV SOLN: INTRAVENOUS | Status: DC | PRN

## 2020-04-22 MED ORDER — PROPOFOL 500 MG/50ML IV EMUL
INTRAVENOUS | Status: DC | PRN
  Administered 2020-04-22: 300 ug/kg/min via INTRAVENOUS

## 2020-04-22 MED ORDER — ONDANSETRON HCL 4 MG/2ML IJ SOLN
INTRAMUSCULAR | Status: DC | PRN
  Administered 2020-04-22: 4 mg via INTRAVENOUS

## 2020-04-22 MED ORDER — ALBUTEROL SULFATE HFA 108 (90 BASE) MCG/ACT IN AERS: 2.0000 | INHALATION_SPRAY | Freq: Four times a day (QID) | RESPIRATORY_TRACT | 0 refills | Status: DC | PRN

## 2020-04-22 MED ORDER — PREDNISONE 20 MG PO TABS: 40.0000 mg | ORAL_TABLET | Freq: Every day | ORAL | 0 refills | Status: AC

## 2020-04-22 MED ORDER — SCOPOLAMINE 1 MG/3DAYS TD PT72: 1.0000 | MEDICATED_PATCH | TRANSDERMAL | 0 refills | Status: DC

## 2020-04-22 SURGICAL SUPPLY — 15 items

## 2020-04-22 NOTE — Op Note (Signed)
[facility] Patient Name: [name] Procedure Date: 04/22/2020 MRN: [id number] Attending MD: Milus Banister , MD Date of Birth: 03/31/1950 CSN: [id number] Age: 70 Admit Type: Inpatient Procedure:                Upper GI endoscopy Indications:              Chronic abdominal pain, nausea, vomiting Providers:                Milus Banister, MD, Particia Nearing, RN, Laverda Sorenson, Technician, Enrigue Catena, CRNA Referring MD:              Medicines:                Monitored Anesthesia Care Complications:            No immediate complications. Estimated blood loss:                            None. Estimated Blood Loss:     Estimated blood loss: none. Procedure:                Pre-Anesthesia Assessment:                           - Prior to the procedure, a History and Physical                            was performed, and patient medications and                            allergies were reviewed. The patient's tolerance of                            previous anesthesia was also reviewed. The risks                            and benefits of the procedure and the sedation                            options and risks were discussed with the patient.                            All questions were answered, and informed consent                            was obtained. Prior Anticoagulants: The patient has                            taken no previous anticoagulant or antiplatelet                            agents. ASA Grade Assessment: III - A patient with  severe systemic disease. After reviewing the risks                            and benefits, the patient was deemed in                            satisfactory condition to undergo the procedure.                           After obtaining informed consent, the endoscope was                            passed under direct vision. Throughout the                             procedure, the patient's blood pressure, pulse, and                            oxygen saturations were monitored continuously. The                            GIF-H190 ([id number]) Olympus gastroscope was                            introduced through the mouth, and advanced to the                            second part of duodenum. The upper GI endoscopy was                            accomplished without difficulty. The patient                            tolerated the procedure well. Scope In: Scope Out: Findings:      Normal GE junction.      Moderate inflammation characterized by erosions, erythema, friability       and granularity was found in the entire examined stomach. Biopsies were       taken with a cold forceps for histology.      The exam was otherwise without abnormality. Impression:               - Moderate gastritis, biopsiesd to check for H.                            pylori.                           - GE junction was normal. The slightly irregular Z                            line noted 2019 appeared normal today, not biopsied.                           - The examination was otherwise normal. Moderate Sedation:  Not Applicable - Patient had care per Anesthesia. Recommendation:           - Ok to advance diet as tolerated.                           - I will follow up on her gastric biopsies. She                            does not take NSAIDs very often but should try to                            reduce to zero NSAIDs given the gastric irritation                            noted today                           - She is safe for d/c from GI perspective, her                            symptoms have been present for years. Should                            continue with the scopalamine patch that we started                            yesterday as it may help her chronic symptoms.                           - Please call or page with any further questions or                             concerns. Procedure Code(s):        --- Professional ---                           [phone], Esophagogastroduodenoscopy, flexible,                            transoral; with biopsy, single or multiple Diagnosis Code(s):        --- Professional ---                           K29.70, Gastritis, unspecified, without bleeding                           R10.84, Generalized abdominal pain CPT copyright 2019 American Medical Association. All rights reserved. The codes documented in this report are preliminary and upon coder review may  be revised to meet current compliance requirements. Milus Banister, MD 04/22/2020 7:42:28 AM This report has been signed electronically. Number of Addenda: 0

## 2020-04-22 NOTE — Transfer of Care (Signed)
Immediate Anesthesia Transfer of Care Note  Patient: [name]  Procedure(s) Performed: ESOPHAGOGASTRODUODENOSCOPY (EGD) WITH PROPOFOL (N/A ) BIOPSY  Patient Location: PACU  Anesthesia Type:MAC  Level of Consciousness: awake, alert , oriented and patient cooperative  Airway & Oxygen Therapy: Patient Spontanous Breathing and Patient connected to face mask oxygen  Post-op Assessment: Report given to RN, Post -op Vital signs reviewed and stable and Patient moving all extremities X 4  Post vital signs: stable  Last Vitals:  Vitals Value Taken Time  BP 147/80 04/22/20 0743  Temp 36.5 C 04/22/20 0743  Pulse 72 04/22/20 0747  Resp 23 04/22/20 0747  SpO2 100 % 04/22/20 0747  Vitals shown include unvalidated device data.  Last Pain:  Vitals:   04/22/20 0743  TempSrc: Oral  PainSc: 7       Patients Stated Pain Goal: 2 (03/55/97 4163)  Complications: No complications documented.

## 2020-04-22 NOTE — Interval H&P Note (Signed)
History and Physical Interval Note:  04/22/2020 7:05 AM  [name]  has presented today for surgery, with the diagnosis of nausea, vomiting , abdominal pain.  The various methods of treatment have been discussed with the patient and family. After consideration of risks, benefits and other options for treatment, the patient has consented to  Procedure(s): ESOPHAGOGASTRODUODENOSCOPY (EGD) WITH PROPOFOL (N/A) as a surgical intervention.  The patient's history has been reviewed, patient examined, no change in status, stable for surgery.  I have reviewed the patient's chart and labs.  Questions were answered to the patient's satisfaction.     Milus Banister

## 2020-04-22 NOTE — Anesthesia Procedure Notes (Signed)
Procedure Name: MAC Date/Time: 04/22/2020 7:19 AM Performed by: Lissa Morales, CRNA Pre-anesthesia Checklist: Patient identified, Emergency Drugs available, Suction available, Patient being monitored and Timeout performed Patient Re-evaluated:Patient Re-evaluated prior to induction Oxygen Delivery Method: Simple face mask Preoxygenation: POM mask. Placement Confirmation: positive ETCO2

## 2020-04-22 NOTE — Anesthesia Preprocedure Evaluation (Signed)
Anesthesia Evaluation  Patient identified by MRN, date of birth, ID band Patient awake    Reviewed: Allergy & Precautions, H&P , NPO status , Patient's Chart, lab work & pertinent test results  History of Anesthesia Complications (+) PONV and history of anesthetic complications  Airway Mallampati: II   Neck ROM: full    Dental   Pulmonary shortness of breath, COPD, Current Smoker and Patient abstained from smoking.,    breath sounds clear to auscultation       Cardiovascular hypertension, + DOE   Rhythm:regular Rate:Normal     Neuro/Psych PSYCHIATRIC DISORDERS Anxiety  Neuromuscular disease CVA    GI/Hepatic GERD  ,  Endo/Other    Renal/GU      Musculoskeletal  (+) Arthritis , Fibromyalgia -  Abdominal   Peds  Hematology   Anesthesia Other Findings   Reproductive/Obstetrics                             Anesthesia Physical Anesthesia Plan  ASA: III  Anesthesia Plan: MAC   Post-op Pain Management:    Induction: Intravenous  PONV Risk Score and Plan: 2 and Propofol infusion and Treatment may vary due to age or medical condition  Airway Management Planned: Nasal Cannula  Additional Equipment:   Intra-op Plan:   Post-operative Plan:   Informed Consent: I have reviewed the patients History and Physical, chart, labs and discussed the procedure including the risks, benefits and alternatives for the proposed anesthesia with the patient or authorized representative who has indicated his/her understanding and acceptance.     Dental advisory given  Plan Discussed with: CRNA, Anesthesiologist and Surgeon  Anesthesia Plan Comments:         Anesthesia Quick Evaluation

## 2020-04-22 NOTE — Progress Notes (Signed)
  PT Cancellation Note  Patient Details Name: [name] MRN: [id number] DOB: 08/23/1950   Cancelled Treatment:    Reason Eval/Treat Not Completed: Patient at procedure or test/unavailable, will check back another time.   Claretha Cooper 04/22/2020, 7:08 AM [place] Pager [phone] Office [phone]

## 2020-04-22 NOTE — Discharge Instructions (Signed)

## 2020-04-22 NOTE — Discharge Summary (Signed)
Physician Discharge Summary  Fayola Meckes PZW:[id number] DOB: [birth year]  PCP: [name], MD  Admitted from: Home Discharged to: Home  Admit date: 04/20/2020 Discharge date: 04/22/2020  Recommendations for Outpatient Follow-up:    Follow-up Information    [name], MD. Schedule an appointment as soon as possible for a visit in 1 week(s).   Specialty: Family Medicine Why: To be seen with repeat labs (CBC & CMP).  Recommend pain management consultation as outpatient. Contact information: [place] [address] [phone]        Milus Banister, MD Follow up.   Specialty: Gastroenterology Why: MDs office will contact you with outstanding results and follow-up as needed. Contact information: [address] [address] [phone]                Home Health: None    Equipment/Devices: None    Discharge Condition: Improved and stable   Code Status: DNR Diet recommendation:  Discharge Diet Orders (From admission, onward)    Start     Ordered   04/22/20 0000  Diet - low sodium heart healthy        04/22/20 1320           Discharge Diagnoses:  Principal Problem:   COPD with acute exacerbation ([facility]) Active Problems:   Anxiety   GERD (gastroesophageal reflux disease)   Essential hypertension   Acute respiratory failure (H[facility])   Chronic pain syndrome   Shortness of breath   Chronic diastolic CHF (congestive heart failure) (H[facility])   Nausea & vomiting   Prolonged QT interval   Brief Summary: 70 year old female with medical history of COPD, not on home oxygen, ongoing tobacco abuse, fibromyalgia, substance abuse, anxiety on benzodiazepines, s/p cholecystectomy, bacterial meningitis, GERD, stroke, hypertension, chronic abdominal pain of unclear etiology, on buprenorphine through PCP, pancreatic cysts presented to the ED with complaints of subacute on chronic abdominal pain, intractable nausea and vomiting for  3 to 4 days PTA complicating chronic vomiting up to 3-4 times per week, poor oral intake/unable to keep things down, reports significant weight loss and worsening dyspnea.  Seen by PCP day prior to ED visit, given a steroid injection and antibiotic injection in the office and an antibiotic prescription but unable to keep things down.  Admitted for acute hypoxic respiratory failure due to COPD exacerbation, intractable nausea and vomiting, subacute on chronic abdominal pain of unclear etiology-likely multifactorial.  [facility] consulted.   Assessment & Plan:   Acute respiratory failure with hypoxia due to COPD exacerbation:  In the ED developed hypoxia with oxygen saturation in the mid to high 80s.  Suspect viral etiology and noncompliance to home pulmonary regimen due to insurance issues i.e. Symbicort and other inhalers.  SARS coronavirus 2 -, RSV negative, chest x-ray with chronic pulmonary hyperinflation and no acute abnormalities.  BNP 83.  Troponins negative.  Lactate normal.  Treated with scheduled DuoNebs and as needed albuterol nebs, Dulera and stopped doxycycline due to negative procalcitonin and low index of infectious etiology.  Protonix was continued for possible GERD  I discussed in detail with TOC team on day of discharge and they indicated that patient has medication benefits through her insurance.  Tobacco cessation counseled.  Recommend outpatient pulmonology consultation and follow-up.  Clinically improved.  Clinical bronchospasm and hypoxia resolved.  Complete additional 3 days of prednisone.  Continue Dulera, as needed albuterol inhaler.  Intractable nausea and vomiting/poor oral intake with failure to thrive,  weight loss  Unclear etiology.  Suspect multifactorial.  DD: Less likely related to small pancreatic cyst noted on repeat CT even though they are slightly larger than prior CT in February, could she have chronic pancreatitis?,  Gastroparesis-may be  complicated by pain medicines, other etiologies but extensive prior work-up by GI not revealing.  Treated with IV PPI, IV Zofran as needed with close monitoring of EKG for QTC prolongation  EKG 2/11 had QTC of about 450 ms.  [facility] consulted for assistance.  GI consultation appreciated.  Per their history, she rarely takes NSAIDs.  They indicated that the cysts in her pancreas are not causing her GI symptoms.  Repeat EGD was performed this morning given her persistence of symptoms and weight loss.  Postprocedure I discussed with Dr. Ardis Hughs.  EGD showed gastritis.  He recommends discharging home on Protonix, scopolamine patch given that other antiemetic options are very limited due to history of prolonged QTC, he will follow-up with patient after biopsy results and arrange follow-up.  Tolerating diet without nausea or vomiting.  Actually did not complain of much abdominal pain either.  Pancreatic cysts/possible chronic pancreatitis  Has prior history of same.  Repeat CT abdomen shows slightly enlarged cyst compared to prior CT in February.  However do not seem large enough for intervention i.e. aspiration etc.  Lipase: 30  GI input appreciated as noted above.  Follow-up pancreatic MRI in 1 year as per radiology recommend  Subacute on chronic abdominal pain  Managed by her PCP at [facility]  Has not seen pain management.  Recommended outpatient pain management consult.  Continue prior home buprenorphine.  Advised to absolutely avoid NSAIDs.  Anxiety disorder  Continue prior home dose of Valium as needed.  Prolonged QTC  EKG 2/11 with normal QTC.  Follow EKG periodically and minimize QT prolonging medications.  GERD:  Continue PPI  Leukocytosis  Possibly steroid-induced due to recent IM steroids.  Low index of clinical suspicion for sepsis.  Chronic diastolic CHF  Compensated.  Continue Coreg although she appears to be on unusual dose/3 times a  day.  Defer to her PCP regarding management but may consider reducing to twice daily dosing.  Essential hypertension:  Controlled.  Continue Coreg.  Tobacco abuse  Reports that she smokes 5 cigarettes/day.  Cessation counseled.  Declines nicotine patch.  Body mass index is 18.02 kg/m.  Anemia  Stable compared to yesterday.  Some of this may be dilutional.  No bleeding reported.  Follow CBC as outpatient.     Consultants:   [facility]  Procedures:   D2/12/22:  Impression:  Moderate gastritis, biopsiesd to check for H. pylori. - GE junction was normal. The slightly irregular Z line noted 2019 appeared normal today, not biopsied. - The examination was otherwise normal.  Recommendations by GI MD:  - Ok to advance diet as tolerated. - I will follow up on her gastric biopsies. She does not take NSAIDs very often but should try to reduce to zero NSAIDs given the gastric irritation noted today - She is safe for d/c from GI perspective, her symptoms have been present for years. Should continue with the scopalamine patch that we started yesterday as it may help her chronic symptoms.   Discharge Instructions  Discharge Instructions    Call MD for:  difficulty breathing, headache or visual disturbances   Complete by: As directed    Call MD for:  extreme fatigue   Complete by: As directed    Call  MD for:  persistant dizziness or light-headedness   Complete by: As directed    Call MD for:  persistant nausea and vomiting   Complete by: As directed    Call MD for:  severe uncontrolled pain   Complete by: As directed    Diet - low sodium heart healthy   Complete by: As directed    Increase activity slowly   Complete by: As directed        Medication List    STOP taking these medications   budesonide-formoterol 80-4.5 MCG/ACT inhaler Commonly known as: Symbicort Replaced by: mometasone-formoterol 100-5 MCG/ACT Aero   carisoprodol 350 MG tablet Commonly  known as: SOMA   ondansetron 4 MG disintegrating tablet Commonly known as: Zofran ODT   ondansetron 4 MG tablet Commonly known as: ZOFRAN   oxyCODONE 5 MG immediate release tablet Commonly known as: Roxicodone   promethazine 25 MG tablet Commonly known as: PHENERGAN     TAKE these medications   albuterol 108 (90 Base) MCG/ACT inhaler Commonly known as: VENTOLIN HFA Inhale 2 puffs into the lungs every 6 (six) hours as needed for wheezing or shortness of breath.   buprenorphine 8 MG Subl SL tablet Commonly known as: SUBUTEX Place 8 mg under the tongue daily.   carvedilol 6.25 MG tablet Commonly known as: COREG Take 6.25 mg by mouth 3 (three) times daily.   diazepam 2 MG tablet Commonly known as: VALIUM Take 2 mg by mouth 4 (four) times daily as needed for anxiety.   mometasone-formoterol 100-5 MCG/ACT Aero Commonly known as: DULERA Inhale 2 puffs into the lungs 2 (two) times daily. Replaces: budesonide-formoterol 80-4.5 MCG/ACT inhaler   pantoprazole 40 MG tablet Commonly known as: PROTONIX Take 1 tablet (40 mg total) by mouth daily.   polyethylene glycol 17 g packet Commonly known as: MiraLax Take 17 g by mouth daily.   predniSONE 20 MG tablet Commonly known as: DELTASONE Take 2 tablets (40 mg total) by mouth daily with breakfast for 3 days. Start taking on: April 23, 2020   scopolamine 1 MG/3DAYS Commonly known as: TRANSDERM-SCOP Place 1 patch (1.5 mg total) onto the skin every 3 (three) days. Start taking on: April 24, 2020   simethicone 125 MG chewable tablet Commonly known as: MYLICON Chew 993 mg by mouth every 6 (six) hours as needed for flatulence.   temazepam 15 MG capsule Commonly known as: RESTORIL Take 15 mg by mouth at bedtime.      Allergies  Allergen Reactions  . Tylenol [Acetaminophen] Other (See Comments)    Inflammed Liver  . Codeine Nausea And Vomiting  . Nsaids Other (See Comments)    Stomach ulcers  . Tolmetin Other (See  Comments)    Stomach ulcers  . Tramadol     GI UPSET      Procedures/Studies: DG Chest 2 View  Result Date: 04/20/2020 CLINICAL DATA:  70 year old female with increasing shortness of breath for 24 hours. EXAM: CHEST - 2 VIEW COMPARISON:  Chest radiographs 01/04/2018 and earlier. FINDINGS: Pulmonary hyperinflation is chronic, but may have progressed since 2019. Mediastinal contours remain normal. Visualized tracheal air column is within normal limits. No pneumothorax, pulmonary edema, pleural effusion or acute pulmonary opacity. Incidental nipple shadows. Osteopenia. No acute osseous abnormality identified. Negative visible bowel gas pattern. IMPRESSION: Chronic pulmonary hyperinflation. No acute cardiopulmonary abnormality. Electronically Signed   By: Genevie Ann M.D.   On: 04/20/2020 05:59   CT ABDOMEN PELVIS W CONTRAST  Result Date: 04/20/2020 CLINICAL DATA:  Epigastric pain with nausea and vomiting EXAM: CT ABDOMEN AND PELVIS WITH CONTRAST TECHNIQUE: Multidetector CT imaging of the abdomen and pelvis was performed using the standard protocol following bolus administration of intravenous contrast. CONTRAST:  69mL OMNIPAQUE IOHEXOL 300 MG/ML  SOLN COMPARISON:  05/06/2019 FINDINGS: Lower chest: No acute abnormality. Hepatobiliary: Gallbladder has been surgically removed. Biliary ductal dilatation is again noted and stable consistent with the post cholecystectomy state. Pancreas: Pancreas is well visualized. Small pancreatic head cyst is noted measuring 9.4 mm slightly increased when compared with the prior exam. Scattered smaller cysts are noted within the body of the pancreas stable in appearance from the prior exam. Spleen: Normal in size without focal abnormality. Adrenals/Urinary Tract: Adrenal glands are within normal limits. Scattered cysts are noted throughout the kidneys. No renal calculi are noted. No obstructive changes are seen. The bladder is well distended. Stomach/Bowel: Scattered  diverticular change of the colon is noted without evidence of diverticulitis. The appendix has been surgically removed. Small bowel and stomach are within normal limits. Vascular/Lymphatic: Aortic atherosclerosis. No enlarged abdominal or pelvic lymph nodes. Reproductive: Status post hysterectomy. No adnexal masses. Other: No abdominal wall hernia or abnormality. No abdominopelvic ascites. Musculoskeletal: Degenerative changes of the lumbar spine are noted. Chronic fusion at L4-5 is noted. IMPRESSION: Status post cholecystectomy with compensatory biliary ductal dilatation stable from the prior exam. Cystic lesions within the pancreas and kidneys predominately stable in appearance from the prior exam although the cyst in the head of the pancreas has increased slightly from 8 to 9 mm. Follow-up pancreatic MRI in 1 year is recommended as previously described. Diverticulosis without diverticulitis. Electronically Signed   By: Inez Catalina M.D.   On: 04/20/2020 11:07      Subjective: Patient seen this morning after procedure.  Reports feeling much better.  Breathing back to baseline.  No chest pain.  Tolerating diet and actually did not report any abdominal pain.  Wants to discharge home today.  Discharge Exam:  Vitals:   04/22/20 0651 04/22/20 0743 04/22/20 0751 04/22/20 0800  BP: (!) 157/76 (!) 147/80 (!) 152/65 (!) 157/68  Pulse: 62 64 69 70  Resp: 13 (!) 9 11 19   Temp: 99.1 F (37.3 C) 97.7 F (36.5 C)    TempSrc: Oral Oral    SpO2: 96% 100% 99% 100%  Weight: 47.6 kg     Height: 5\' 4"  (1.626 m)       General exam: Middle-age female, small built, chronically ill and cachectic looking lying comfortably propped up in bed.  Oral mucosa moist Respiratory system:  Clear to auscultation without wheezing, rhonchi or crackles.  No increased work of breathing.  Able to speak in full sentences. Cardiovascular system: S1 & S2 heard, RRR. No JVD, murmurs, rubs, gallops or clicks. No pedal  edema. Gastrointestinal system: Abdomen is nondistended, soft and nontender. No organomegaly or masses felt. Normal bowel sounds heard. Central nervous system: Alert and oriented. No focal neurological deficits. Extremities: Symmetric 5 x 5 power. Skin: No rashes, lesions or ulcers Psychiatry: Judgement and insight appear normal. Mood & affect appropriate.    The results of significant diagnostics from this hospitalization (including imaging, microbiology, ancillary and laboratory) are listed below for reference.     Microbiology: Recent Results (from the past 240 hour(s))  SARS CORONAVIRUS 2 (TAT 6-24 HRS) Nasopharyngeal Nasopharyngeal Swab     Status: None   Collection Time: 04/20/20  8:27 AM   Specimen: Nasopharyngeal Swab  Result Value Ref Range Status  SARS Coronavirus 2 NEGATIVE NEGATIVE Final    Comment: (NOTE) SARS-CoV-2 target nucleic acids are NOT DETECTED.  The SARS-CoV-2 RNA is generally detectable in upper and lower respiratory specimens during the acute phase of infection. Negative results do not preclude SARS-CoV-2 infection, do not rule out co-infections with other pathogens, and should not be used as the sole basis for treatment or other patient management decisions. Negative results must be combined with clinical observations, patient history, and epidemiological information. The expected result is Negative.  Fact Sheet for Patients: SugarRoll.be  Fact Sheet for Healthcare Providers: https://www.woods-mathews.com/  This test is not yet approved or cleared by the [place] FDA and  has been authorized for detection and/or diagnosis of SARS-CoV-2 by FDA under an Emergency Use Authorization (EUA). This EUA will remain  in effect (meaning this test can be used) for the duration of the COVID-19 declaration under Se ction 564(b)(1) of the Act, 21 U.S.C. section 360bbb-3(b)(1), unless the authorization is terminated  or revoked sooner.  Performed at [facility], [place] [address] [place], [place] [address]   Respiratory (~20 pathogens) panel by PCR     Status: None   Collection Time: 04/20/20  6:00 PM   Specimen: Nasopharyngeal Swab; Respiratory  Result Value Ref Range Status   Adenovirus NOT DETECTED NOT DETECTED Final   Coronavirus 229E NOT DETECTED NOT DETECTED Final    Comment: (NOTE) The Coronavirus on the Respiratory Panel, DOES NOT test for the novel  Coronavirus (2019 nCoV)    Coronavirus HKU1 NOT DETECTED NOT DETECTED Final   Coronavirus NL63 NOT DETECTED NOT DETECTED Final   Coronavirus OC43 NOT DETECTED NOT DETECTED Final   Metapneumovirus NOT DETECTED NOT DETECTED Final   Rhinovirus / Enterovirus NOT DETECTED NOT DETECTED Final   Influenza A NOT DETECTED NOT DETECTED Final   Influenza B NOT DETECTED NOT DETECTED Final   Parainfluenza Virus 1 NOT DETECTED NOT DETECTED Final   Parainfluenza Virus 2 NOT DETECTED NOT DETECTED Final   Parainfluenza Virus 3 NOT DETECTED NOT DETECTED Final   Parainfluenza Virus 4 NOT DETECTED NOT DETECTED Final   Respiratory Syncytial Virus NOT DETECTED NOT DETECTED Final   Bordetella pertussis NOT DETECTED NOT DETECTED Final   Bordetella Parapertussis NOT DETECTED NOT DETECTED Final   Chlamydophila pneumoniae NOT DETECTED NOT DETECTED Final   Mycoplasma pneumoniae NOT DETECTED NOT DETECTED Final    Comment: Performed at [facility], [place]. [address] [place], [place] [address]     Labs: CBC: Recent Labs  Lab 04/20/20 0827 04/21/20 0358 04/22/20 0613  WBC 13.5* 11.7* 14.5*  HGB 15.8* 12.5 11.9*  HCT 46.1* 37.8 36.6  MCV 89.7 93.3 94.3  PLT 361 259 295    Basic Metabolic Panel: Recent Labs  Lab 04/20/20 0827 04/21/20 0358 04/22/20 0613  NA 141 140 140  K 4.0 3.8 3.7  CL 101 104 104  CO2 27 28 29   GLUCOSE 141* 125* 94  BUN 19 26* 36*  CREATININE 0.61 0.59 0.68  CALCIUM 10.3 9.4 9.4    Liver Function Tests: Recent Labs   Lab 04/20/20 0827  AST 21  ALT 15  ALKPHOS 85  BILITOT 0.7  PROT 8.2*  ALBUMIN 4.8     Time coordinating discharge: 35 minutes  SIGNED:  Vernell Leep, MD, [place], [facility]. [facility]  To contact the attending provider between 7A-7P or the covering provider during after hours 7P-7A, please log into the web site [email or web address] and access using universal [facility] password for that web site. If you do not have the password, please call the hospital operator.

## 2020-04-24 ENCOUNTER — Encounter (HOSPITAL_COMMUNITY): Payer: Self-pay | Admitting: Gastroenterology

## 2020-04-24 NOTE — Anesthesia Postprocedure Evaluation (Signed)
Anesthesia Post Note  Patient: [name]  Procedure(s) Performed: ESOPHAGOGASTRODUODENOSCOPY (EGD) WITH PROPOFOL (N/A ) BIOPSY     Patient location during evaluation: Endoscopy Anesthesia Type: MAC Level of consciousness: awake and alert Pain management: pain level controlled Vital Signs Assessment: post-procedure vital signs reviewed and stable Respiratory status: spontaneous breathing, nonlabored ventilation, respiratory function stable and patient connected to nasal cannula oxygen Cardiovascular status: stable and blood pressure returned to baseline Postop Assessment: no apparent nausea or vomiting Anesthetic complications: no   No complications documented.  Last Vitals:  Vitals:   04/22/20 0751 04/22/20 0800  BP: (!) 152/65 (!) 157/68  Pulse: 69 70  Resp: 11 19  Temp:    SpO2: 99% 100%    Last Pain:  Vitals:   04/22/20 0800  TempSrc:   PainSc: 7                  Charee Tumblin S

## 2020-04-25 LAB — SURGICAL PATHOLOGY

## 2020-05-06 ENCOUNTER — Emergency Department (HOSPITAL_COMMUNITY)
Admission: EM | Admit: 2020-05-06 | Discharge: 2020-05-06 | Disposition: A | Discharge status: Home / Self Care | Payer: Medicare Other | Attending: Emergency Medicine | Admitting: Emergency Medicine

## 2020-05-06 ENCOUNTER — Other Ambulatory Visit: Payer: Self-pay

## 2020-05-06 ENCOUNTER — Encounter (HOSPITAL_COMMUNITY): Payer: Self-pay | Admitting: Emergency Medicine

## 2020-05-06 ENCOUNTER — Emergency Department (HOSPITAL_COMMUNITY): Payer: Medicare Other

## 2020-05-06 DIAGNOSIS — R0602 Shortness of breath: Secondary | ICD-10-CM | POA: Diagnosis present

## 2020-05-06 DIAGNOSIS — J45909 Unspecified asthma, uncomplicated: Secondary | ICD-10-CM | POA: Insufficient documentation

## 2020-05-06 DIAGNOSIS — I110 Hypertensive heart disease with heart failure: Secondary | ICD-10-CM | POA: Insufficient documentation

## 2020-05-06 DIAGNOSIS — F17210 Nicotine dependence, cigarettes, uncomplicated: Secondary | ICD-10-CM | POA: Insufficient documentation

## 2020-05-06 DIAGNOSIS — I5032 Chronic diastolic (congestive) heart failure: Secondary | ICD-10-CM | POA: Diagnosis not present

## 2020-05-06 DIAGNOSIS — Z7951 Long term (current) use of inhaled steroids: Secondary | ICD-10-CM | POA: Insufficient documentation

## 2020-05-06 DIAGNOSIS — Z79899 Other long term (current) drug therapy: Secondary | ICD-10-CM | POA: Diagnosis not present

## 2020-05-06 DIAGNOSIS — J441 Chronic obstructive pulmonary disease with (acute) exacerbation: Secondary | ICD-10-CM | POA: Diagnosis not present

## 2020-05-06 MED ORDER — PREDNISONE 10 MG PO TABS: 40.0000 mg | ORAL_TABLET | Freq: Every day | ORAL | 0 refills | Status: AC

## 2020-05-06 NOTE — ED Triage Notes (Signed)
Patient presents with shortness of breath. When EMS arrived the report the patient seemed as if she was in a panic. The patient told EMS that she tried using her inhaler, but it did not help. HX: COPD, anxiety   EMS vitals: 112/80 BP 100 HR 18 Resp Rate 96% SPO2 on room air

## 2020-05-06 NOTE — ED Provider Notes (Signed)
[facility] DEPT Provider Note   CSN: [id number] Arrival date & time: 05/06/20  2049     History Chief Complaint  Patient presents with  . Shortness of Breath    [name] is a 70 y.o. female.   Patient states that earlier this afternoon she noticed her fingers were swelling and that her legs felt heavy when she tried to walk to the bathroom.  This made her anxious and she developed shortness of breath and felt like her "head was spinning".  Patient was recently discharged from hospital for COPD exacerbation.  She takes her albuterol but does not have her Dulera.  She denies headache, blurred vision, chest pain.  Abdominal pain is chronic and unchanged.  Patient states her breathing has improved since coming to the emergency department.  She would like to know if she can go home if she is able.  Patient takes Subutex for pain as well as Ativan (will switch from diazepam).        Past Medical History:  Diagnosis Date  . Acute blood loss anemia   . Arrhythmia   . Arthritis   . C. difficile colitis   . Cancer ([place])   . Diverticulosis   . Fibromyalgia   . GERD (gastroesophageal reflux disease)   . GI hemorrhage   . H/O: substance abuse ([place]) 2005   narcotic usage due to chronic back pain  . Heart murmur   . Hypertension   . Pancreatic cyst   . Pneumococcal meningitis   . PONV (postoperative nausea and vomiting)   . Renal cyst   . Stroke (cerebrum) [facility])     Patient Active Problem List   Diagnosis Date Noted  . Shortness of breath 04/20/2020  . Chronic diastolic CHF (congestive heart failure) ([place]) 04/20/2020  . Nausea & vomiting 04/20/2020  . Prolonged QT interval 04/20/2020  . COPD with acute exacerbation ([place]) 06/20/2017  . DOE (dyspnea on exertion) 06/19/2017  . Cervical dystonia 11/27/2016  . C. difficile colitis 09/10/2016  . Abdominal pain 09/08/2016  . Acute encephalopathy   . Fever   . Left hemiparesis ([place])   .  Generalized OA   . Fibromyalgia   . Tobacco abuse   . Substance abuse ([place])   . Benign essential HTN   . Tachycardia   . Chronic pain syndrome   . Acute blood loss anemia   . Leukocytosis   . Septic thrombophlebitis of sagittal sinus   . Acute respiratory failure ([place])   . Streptococcus pneumoniae meningitis   . Streptococcal bacteremia   . Meningitis   . Cerebral embolism with cerebral infarction 04/12/2016  . Stroke (cerebrum) ([place]) 04/12/2016  . Neck pain 04/04/2016  . Chronic abdominal pain 02/19/2016  . Essential hypertension 02/19/2016  . Bacterial conjunctivitis 02/19/2016  . Health care maintenance 02/19/2016  . Anxiety 05/08/2006  . Cigarette smoker 05/08/2006  . ASTHMA, UNSPECIFIED 05/08/2006  . GERD (gastroesophageal reflux disease) 05/08/2006  . CONSTIPATION 05/08/2006  . CONVULSIONS, SEIZURES, NOS 05/08/2006    Past Surgical History:  Procedure Laterality Date  . ABDOMINAL HYSTERECTOMY    . ABDOMINAL SURGERY    . APPENDECTOMY    . BIOPSY  04/22/2020   Procedure: BIOPSY;  Surgeon: Milus Banister, MD;  Location: [facility];  Service: Gastroenterology;;  . BREAST SURGERY    . CHOLECYSTECTOMY N/A 09/30/2012   Procedure: LAPAROSCOPIC CHOLECYSTECTOMY WITH INTRAOPERATIVE CHOLANGIOGRAM;  Surgeon: Imogene Burn. Georgette Dover, MD;  Location: [facility];  Service: General;  Laterality:  N/A;  . ESOPHAGOGASTRODUODENOSCOPY (EGD) WITH PROPOFOL N/A 04/22/2020   Procedure: ESOPHAGOGASTRODUODENOSCOPY (EGD) WITH PROPOFOL;  Surgeon: Milus Banister, MD;  Location: [facility];  Service: Gastroenterology;  Laterality: N/A;  . FRACTURE SURGERY     right upper arm  . OVARIAN CYST SURGERY       OB History   No obstetric history on file.     Family History  Problem Relation Age of Onset  . Alzheimer's disease Mother   . Heart disease Father   . Prostate cancer Father     Social History   Tobacco Use  . Smoking status: Current Every Day Smoker    Packs/day: 0.75    Years: 49.00     Pack years: 36.75    Types: Cigarettes  . Smokeless tobacco: Never Used  Vaping Use  . Vaping Use: Never used  Substance Use Topics  . Alcohol use: No  . Drug use: No    Comment: Methadone for narcotic use    Home Medications Prior to Admission medications   Medication Sig Start Date End Date Taking? Authorizing Provider  predniSONE (DELTASONE) 10 MG tablet Take 4 tablets (40 mg total) by mouth daily for 5 days. 05/06/20 05/11/20 Yes Benay Pike, MD  albuterol (VENTOLIN HFA) 108 (90 Base) MCG/ACT inhaler Inhale 2 puffs into the lungs every 6 (six) hours as needed for wheezing or shortness of breath. 04/22/20   Hongalgi, Lenis Dickinson, MD  buprenorphine (SUBUTEX) 8 MG SUBL SL tablet Place 8 mg under the tongue daily. 04/15/20   [provider]  carvedilol (COREG) 6.25 MG tablet Take 6.25 mg by mouth 3 (three) times daily.  12/25/17   [provider]  diazepam (VALIUM) 2 MG tablet Take 2 mg by mouth 4 (four) times daily as needed for anxiety. 04/02/20   [provider]  mometasone-formoterol (DULERA) 100-5 MCG/ACT AERO Inhale 2 puffs into the lungs 2 (two) times daily. 04/22/20   Hongalgi, Lenis Dickinson, MD  pantoprazole (PROTONIX) 40 MG tablet Take 1 tablet (40 mg total) by mouth daily. 04/01/17   Pyrtle, Lajuan Lines, MD  polyethylene glycol [facility]) packet Take 17 g by mouth daily. 01/04/18   Isla Pence, MD  scopolamine (TRANSDERM-SCOP) 1 MG/3DAYS Place 1 patch (1.5 mg total) onto the skin every 3 (three) days. 04/24/20   Hongalgi, Lenis Dickinson, MD  simethicone (MYLICON) 350 MG chewable tablet Chew 125 mg by mouth every 6 (six) hours as needed for flatulence.    [provider]  temazepam (RESTORIL) 15 MG capsule Take 15 mg by mouth at bedtime.  12/02/17   [provider]    Allergies    Tylenol [acetaminophen], Codeine, Nsaids, Tolmetin, and Tramadol  Review of Systems   Review of Systems  All other systems reviewed and are negative.   Physical  Exam Updated Vital Signs BP 123/84   Pulse 94   Temp 98.3 F (36.8 C) (Oral)   Resp 20   Ht 5\' 4"  (1.626 m)   Wt 47.6 kg   SpO2 94%   BMI 18.02 kg/m   Physical Exam Vitals reviewed.  Constitutional:      Comments: Cachectic  HENT:     Head: Normocephalic and atraumatic.     Mouth/Throat:     Mouth: Mucous membranes are moist.     Pharynx: Oropharynx is clear.  Cardiovascular:     Rate and Rhythm: Normal rate and regular rhythm.     Heart sounds: No murmur heard.   Pulmonary:  Effort: Pulmonary effort is normal.     Breath sounds: Examination of the right-upper field reveals wheezing. Examination of the left-upper field reveals wheezing. Wheezing present. No rhonchi or rales.  Chest:     Chest wall: No mass or tenderness.  Abdominal:     Comments: Scaphoid abdomen  Musculoskeletal:     Cervical back: Normal range of motion.     Right lower leg: No tenderness. No edema.     Left lower leg: No tenderness. No edema.     Comments: No swelling of the fingers or hands.  Skin:    General: Skin is warm and dry.  Neurological:     General: No focal deficit present.     Mental Status: She is alert.     ED Results / Procedures / Treatments   Labs (all labs ordered are listed, but only abnormal results are displayed) Labs Reviewed - No data to display  EKG None  Radiology DG Chest 2 View  Result Date: 05/06/2020 CLINICAL DATA:  Shortness of breath, COPD, anxiety. EXAM: CHEST - 2 VIEW COMPARISON:  Chest x-ray 04/20/2020 FINDINGS: The heart size and mediastinal contours are unchanged. Aortic arch calcifications. Hyperinflation. No focal consolidation. No pulmonary edema. No pleural effusion. No pneumothorax. No acute osseous abnormality. IMPRESSION: No active cardiopulmonary disease. Electronically Signed   By: Iven Finn M.D.   On: 05/06/2020 22:35    Procedures Procedures   Medications Ordered in ED Medications - No data to display  ED Course  I have  reviewed the triage vital signs and the nursing notes.  Pertinent labs & imaging results that were available during my care of the patient were reviewed by me and considered in my medical decision making (see chart for details).    MDM Rules/Calculators/A&P                          Patient presents via EMS after complaining of shortness of breath.  Shortness of breath occurred after patient noticed her hands were swelling and her legs felt "heavy".  Patient admits that her anxiety may have been causing her shortness of breath.  States she feels better now.  On exam there were mild expiratory wheezes in the upper lobes bilaterally.  She was saturating 96% on room air and not in any respiratory distress.  Without any complaints of chest pain or new abdominal pain ACS unlikely.  Based on her exam and vitals, PE also unlikely.  No cough or fever to suggest respiratory infection.  EKG and chest x-ray were reassuring.  Patient was eager to go home.  Discharge patient with prescription for 5 days of prednisone 40 mg for mild COPD exacerbation.  Advised patient to follow-up with PCP. Final Clinical Impression(s) / ED Diagnoses Final diagnoses:  COPD exacerbation ([place])  SOB (shortness of breath)    Rx / DC Orders ED Discharge Orders         Ordered    predniSONE (DELTASONE) 10 MG tablet  Daily        05/06/20 2237           Benay Pike, MD 05/06/20 2254    Carmin Muskrat, MD 05/07/20 2217

## 2020-05-06 NOTE — Discharge Instructions (Signed)
You may be having a slight COPD exacerbation based on your symptoms and your exam.  I have prescribed 5 days of prednisone for you to take.    If you develop any more concerning symptoms you should talk to a medical provider.    Call your pcp's office to schedule an follow up appointment.

## 2020-05-09 DIAGNOSIS — F29 Unspecified psychosis not due to a substance or known physiological condition: Secondary | ICD-10-CM | POA: Insufficient documentation

## 2020-05-10 DIAGNOSIS — F1911 Other psychoactive substance abuse, in remission: Secondary | ICD-10-CM | POA: Insufficient documentation

## 2020-06-27 ENCOUNTER — Encounter: Payer: Self-pay | Admitting: Internal Medicine

## 2020-06-30 ENCOUNTER — Other Ambulatory Visit: Payer: Self-pay

## 2020-06-30 ENCOUNTER — Emergency Department (HOSPITAL_COMMUNITY)
Admission: EM | Admit: 2020-06-30 | Discharge: 2020-07-03 | Disposition: A | Discharge status: Home / Self Care | Payer: Medicare Other | Attending: Emergency Medicine | Admitting: Emergency Medicine

## 2020-06-30 DIAGNOSIS — I110 Hypertensive heart disease with heart failure: Secondary | ICD-10-CM | POA: Diagnosis not present

## 2020-06-30 DIAGNOSIS — Z8673 Personal history of transient ischemic attack (TIA), and cerebral infarction without residual deficits: Secondary | ICD-10-CM | POA: Insufficient documentation

## 2020-06-30 DIAGNOSIS — Z1339 Encounter for screening examination for other mental health and behavioral disorders: Secondary | ICD-10-CM | POA: Diagnosis not present

## 2020-06-30 DIAGNOSIS — R443 Hallucinations, unspecified: Secondary | ICD-10-CM | POA: Diagnosis not present

## 2020-06-30 DIAGNOSIS — D75838 Other thrombocytosis: Secondary | ICD-10-CM | POA: Insufficient documentation

## 2020-06-30 DIAGNOSIS — R519 Headache, unspecified: Secondary | ICD-10-CM | POA: Diagnosis present

## 2020-06-30 DIAGNOSIS — J441 Chronic obstructive pulmonary disease with (acute) exacerbation: Secondary | ICD-10-CM | POA: Insufficient documentation

## 2020-06-30 DIAGNOSIS — I5032 Chronic diastolic (congestive) heart failure: Secondary | ICD-10-CM | POA: Insufficient documentation

## 2020-06-30 DIAGNOSIS — F333 Major depressive disorder, recurrent, severe with psychotic symptoms: Secondary | ICD-10-CM | POA: Diagnosis not present

## 2020-06-30 DIAGNOSIS — F17210 Nicotine dependence, cigarettes, uncomplicated: Secondary | ICD-10-CM | POA: Insufficient documentation

## 2020-06-30 DIAGNOSIS — Z20822 Contact with and (suspected) exposure to covid-19: Secondary | ICD-10-CM | POA: Insufficient documentation

## 2020-06-30 DIAGNOSIS — D75839 Thrombocytosis, unspecified: Secondary | ICD-10-CM

## 2020-06-30 LAB — BASIC METABOLIC PANEL
Anion gap: 9 (ref 5–15)
BUN: 13 mg/dL (ref 8–23)
CO2: 28 mmol/L (ref 22–32)
Calcium: 9.8 mg/dL (ref 8.9–10.3)
Chloride: 103 mmol/L (ref 98–111)
Creatinine, Ser: 0.62 mg/dL (ref 0.44–1.00)
GFR, Estimated: >60 mL/min (ref >60)
Glucose, Bld: 130 mg/dL — ABNORMAL HIGH (ref 70–99)
Potassium: 3.5 mmol/L (ref 3.5–5.1)
Sodium: 140 mmol/L (ref 135–145)

## 2020-06-30 LAB — HEPATIC FUNCTION PANEL
ALT: 19 U/L (ref 0–44)
AST: 25 U/L (ref 15–41)
Albumin: 4.1 g/dL (ref 3.5–5.0)
Alkaline Phosphatase: 92 U/L (ref 38–126)
Bilirubin, Direct: <0.1 mg/dL (ref 0.0–0.2)
Total Bilirubin: 0.6 mg/dL (ref 0.3–1.2)
Total Protein: 6.9 g/dL (ref 6.5–8.1)

## 2020-06-30 LAB — CBC WITH DIFFERENTIAL/PLATELET
Abs Immature Granulocytes: 0.03 10*3/uL (ref 0.00–0.07)
Basophils Absolute: 0.1 10*3/uL (ref 0.0–0.1)
Basophils Relative: 1 %
Eosinophils Absolute: 0 10*3/uL (ref 0.0–0.5)
Eosinophils Relative: 0 %
HCT: 39.9 % (ref 36.0–46.0)
Hemoglobin: 13 g/dL (ref 12.0–15.0)
Immature Granulocytes: 0 %
Lymphocytes Relative: 9 %
Lymphs Abs: 0.9 10*3/uL (ref 0.7–4.0)
MCH: 29.8 pg (ref 26.0–34.0)
MCHC: 32.6 g/dL (ref 30.0–36.0)
MCV: 91.5 fL (ref 80.0–100.0)
Monocytes Absolute: 0.5 10*3/uL (ref 0.1–1.0)
Monocytes Relative: 5 %
Neutro Abs: 8.6 10*3/uL — ABNORMAL HIGH (ref 1.7–7.7)
Neutrophils Relative %: 85 %
Platelets: 408 10*3/uL — ABNORMAL HIGH (ref 150–400)
RBC: 4.36 MIL/uL (ref 3.87–5.11)
RDW: 13.2 % (ref 11.5–15.5)
WBC: 10 10*3/uL (ref 4.0–10.5)
nRBC: 0 % (ref 0.0–0.2)

## 2020-06-30 LAB — URINALYSIS, ROUTINE W REFLEX MICROSCOPIC
Bacteria, UA: NONE SEEN
Bilirubin Urine: NEGATIVE
Glucose, UA: 150 mg/dL — AB
Ketones, ur: NEGATIVE mg/dL
Leukocytes,Ua: NEGATIVE
Nitrite: NEGATIVE
Protein, ur: 100 mg/dL — AB
Specific Gravity, Urine: 1.012 (ref 1.005–1.030)
pH: 7 (ref 5.0–8.0)

## 2020-06-30 LAB — MAGNESIUM: Magnesium: 1.8 mg/dL (ref 1.7–2.4)

## 2020-06-30 LAB — RESP PANEL BY RT-PCR (FLU A&B, COVID) ARPGX2
Influenza A by PCR: NEGATIVE
Influenza B by PCR: NEGATIVE
SARS Coronavirus 2 by RT PCR: NEGATIVE

## 2020-06-30 MED ORDER — DIAZEPAM 2 MG PO TABS
2.0000 mg | ORAL_TABLET | Freq: Four times a day (QID) | ORAL | Status: DC | PRN
  Administered 2020-07-01 – 2020-07-03 (×4): 2 mg via ORAL
  Filled 2020-06-30 (×4): qty 1

## 2020-06-30 MED ORDER — NICOTINE 14 MG/24HR TD PT24
14.0000 mg | MEDICATED_PATCH | Freq: Every day | TRANSDERMAL | Status: DC
  Administered 2020-06-30: 14 mg via TRANSDERMAL
  Filled 2020-06-30: qty 1

## 2020-06-30 MED ORDER — BUPRENORPHINE HCL-NALOXONE HCL 8-2 MG SL SUBL: 1.0000 | SUBLINGUAL_TABLET | Freq: Every day | SUBLINGUAL | Status: DC

## 2020-06-30 MED ORDER — ALBUTEROL SULFATE HFA 108 (90 BASE) MCG/ACT IN AERS: 2.0000 | INHALATION_SPRAY | Freq: Four times a day (QID) | RESPIRATORY_TRACT | Status: DC | PRN

## 2020-06-30 MED ORDER — ALUM & MAG HYDROXIDE-SIMETH 200-200-20 MG/5ML PO SUSP: 30.0000 mL | Freq: Four times a day (QID) | ORAL | Status: DC | PRN

## 2020-06-30 MED ORDER — HALOPERIDOL 1 MG PO TABS
2.0000 mg | ORAL_TABLET | Freq: Once | ORAL | Status: AC
  Administered 2020-06-30: 2 mg via ORAL
  Filled 2020-06-30: qty 2

## 2020-06-30 MED ORDER — CARVEDILOL 3.125 MG PO TABS
6.2500 mg | ORAL_TABLET | Freq: Three times a day (TID) | ORAL | Status: DC
  Administered 2020-06-30: 6.25 mg via ORAL
  Filled 2020-06-30: qty 2

## 2020-06-30 MED ORDER — POTASSIUM CHLORIDE CRYS ER 20 MEQ PO TBCR
40.0000 meq | EXTENDED_RELEASE_TABLET | Freq: Once | ORAL | Status: AC
  Administered 2020-06-30: 40 meq via ORAL
  Filled 2020-06-30: qty 2

## 2020-06-30 MED ORDER — ONDANSETRON 4 MG PO TBDP
8.0000 mg | ORAL_TABLET | Freq: Once | ORAL | Status: AC
  Administered 2020-06-30: 8 mg via ORAL
  Filled 2020-06-30: qty 2

## 2020-06-30 MED ORDER — BUPRENORPHINE HCL 2 MG SL SUBL
6.0000 mg | SUBLINGUAL_TABLET | Freq: Every day | SUBLINGUAL | Status: DC
Start: 2020-06-30 — End: 2020-07-04
  Administered 2020-07-01 – 2020-07-03 (×3): 6 mg via SUBLINGUAL
  Filled 2020-06-30 (×3): qty 3

## 2020-06-30 MED ORDER — CARVEDILOL 12.5 MG PO TABS
6.2500 mg | ORAL_TABLET | Freq: Two times a day (BID) | ORAL | Status: DC
  Administered 2020-06-30 – 2020-07-03 (×7): 6.25 mg via ORAL
  Filled 2020-06-30 (×2): qty 2
  Filled 2020-06-30: qty 1
  Filled 2020-06-30: qty 2
  Filled 2020-06-30 (×2): qty 1
  Filled 2020-06-30: qty 2

## 2020-06-30 MED ORDER — ONDANSETRON HCL 4 MG PO TABS: 4.0000 mg | ORAL_TABLET | Freq: Three times a day (TID) | ORAL | Status: DC | PRN

## 2020-06-30 MED ORDER — PANTOPRAZOLE SODIUM 40 MG PO TBEC
40.0000 mg | DELAYED_RELEASE_TABLET | Freq: Every day | ORAL | Status: DC
  Administered 2020-06-30 – 2020-07-03 (×3): 40 mg via ORAL
  Filled 2020-06-30 (×3): qty 1

## 2020-06-30 MED ORDER — MOMETASONE FURO-FORMOTEROL FUM 100-5 MCG/ACT IN AERO
2.0000 | INHALATION_SPRAY | Freq: Two times a day (BID) | RESPIRATORY_TRACT | Status: DC
  Administered 2020-06-30 – 2020-07-02 (×5): 2 via RESPIRATORY_TRACT
  Filled 2020-06-30 (×2): qty 8.8

## 2020-06-30 NOTE — ED Provider Notes (Signed)
[facility] EMERGENCY DEPARTMENT Provider Note   CSN: [id number] Arrival date & time: 06/30/20  0536   History Chief Complaint  Patient presents with  . Headache  . Psychiatric Evaluation    [name] is a 70 y.o. female.  The history is provided by the patient.  Headache She has history of hypertension, COPD, stroke and was brought in by her son for psychiatric evaluation.  Patient does admit to seeing the devil and Jesus.  She is also complaining of a bifrontal headache and is requesting something for it.  She denies visual change.  There has been no nausea or vomiting.  She denies any weakness.  She is reported to have been recently discharged from a psychiatric facility in [place].  She denies homicidal ideation and denies suicidal ideation.  Past Medical History:  Diagnosis Date  . Acute blood loss anemia   . Arrhythmia   . Arthritis   . C. difficile colitis   . Cancer ([place])   . Diverticulosis   . Fibromyalgia   . GERD (gastroesophageal reflux disease)   . GI hemorrhage   . H/O: substance abuse ([place]) 2005   narcotic usage due to chronic back pain  . Heart murmur   . Hypertension   . Pancreatic cyst   . Pneumococcal meningitis   . PONV (postoperative nausea and vomiting)   . Renal cyst   . Stroke (cerebrum) [facility])     Patient Active Problem List   Diagnosis Date Noted  . Shortness of breath 04/20/2020  . Chronic diastolic CHF (congestive heart failure) ([place]) 04/20/2020  . Nausea & vomiting 04/20/2020  . Prolonged QT interval 04/20/2020  . COPD with acute exacerbation ([place]) 06/20/2017  . DOE (dyspnea on exertion) 06/19/2017  . Cervical dystonia 11/27/2016  . C. difficile colitis 09/10/2016  . Abdominal pain 09/08/2016  . Acute encephalopathy   . Fever   . Left hemiparesis ([place])   . Generalized OA   . Fibromyalgia   . Tobacco abuse   . Substance abuse ([place])   . Benign essential HTN   . Tachycardia   . Chronic pain syndrome   .  Acute blood loss anemia   . Leukocytosis   . Septic thrombophlebitis of sagittal sinus   . Acute respiratory failure ([place])   . Streptococcus pneumoniae meningitis   . Streptococcal bacteremia   . Meningitis   . Cerebral embolism with cerebral infarction 04/12/2016  . Stroke (cerebrum) ([place]) 04/12/2016  . Neck pain 04/04/2016  . Chronic abdominal pain 02/19/2016  . Essential hypertension 02/19/2016  . Bacterial conjunctivitis 02/19/2016  . Health care maintenance 02/19/2016  . Anxiety 05/08/2006  . Cigarette smoker 05/08/2006  . ASTHMA, UNSPECIFIED 05/08/2006  . GERD (gastroesophageal reflux disease) 05/08/2006  . CONSTIPATION 05/08/2006  . CONVULSIONS, SEIZURES, NOS 05/08/2006    Past Surgical History:  Procedure Laterality Date  . ABDOMINAL HYSTERECTOMY    . ABDOMINAL SURGERY    . APPENDECTOMY    . BIOPSY  04/22/2020   Procedure: BIOPSY;  Surgeon: Milus Banister, MD;  Location: [facility];  Service: Gastroenterology;;  . BREAST SURGERY    . CHOLECYSTECTOMY N/A 09/30/2012   Procedure: LAPAROSCOPIC CHOLECYSTECTOMY WITH INTRAOPERATIVE CHOLANGIOGRAM;  Surgeon: Imogene Burn. Georgette Dover, MD;  Location: [facility];  Service: General;  Laterality: N/A;  . ESOPHAGOGASTRODUODENOSCOPY (EGD) WITH PROPOFOL N/A 04/22/2020   Procedure: ESOPHAGOGASTRODUODENOSCOPY (EGD) WITH PROPOFOL;  Surgeon: Milus Banister, MD;  Location: [facility];  Service: Gastroenterology;  Laterality: N/A;  . FRACTURE  SURGERY     right upper arm  . OVARIAN CYST SURGERY       OB History   No obstetric history on file.     Family History  Problem Relation Age of Onset  . Alzheimer's disease Mother   . Heart disease Father   . Prostate cancer Father     Social History   Tobacco Use  . Smoking status: Current Every Day Smoker    Packs/day: 0.75    Years: 49.00    Pack years: 36.75    Types: Cigarettes  . Smokeless tobacco: Never Used  Vaping Use  . Vaping Use: Never used  Substance Use Topics  . Alcohol  use: No  . Drug use: No    Comment: Methadone for narcotic use    Home Medications Prior to Admission medications   Medication Sig Start Date End Date Taking? Authorizing Provider  albuterol (VENTOLIN HFA) 108 (90 Base) MCG/ACT inhaler Inhale 2 puffs into the lungs every 6 (six) hours as needed for wheezing or shortness of breath. 04/22/20   Hongalgi, Lenis Dickinson, MD  buprenorphine (SUBUTEX) 8 MG SUBL SL tablet Place 8 mg under the tongue daily. 04/15/20   [provider]  carvedilol (COREG) 6.25 MG tablet Take 6.25 mg by mouth 3 (three) times daily.  12/25/17   [provider]  diazepam (VALIUM) 2 MG tablet Take 2 mg by mouth 4 (four) times daily as needed for anxiety. 04/02/20   [provider]  mometasone-formoterol (DULERA) 100-5 MCG/ACT AERO Inhale 2 puffs into the lungs 2 (two) times daily. 04/22/20   Hongalgi, Lenis Dickinson, MD  pantoprazole (PROTONIX) 40 MG tablet Take 1 tablet (40 mg total) by mouth daily. 04/01/17   Pyrtle, Lajuan Lines, MD  polyethylene glycol [facility]) packet Take 17 g by mouth daily. 01/04/18   Isla Pence, MD  scopolamine (TRANSDERM-SCOP) 1 MG/3DAYS Place 1 patch (1.5 mg total) onto the skin every 3 (three) days. 04/24/20   Hongalgi, Lenis Dickinson, MD  simethicone (MYLICON) 242 MG chewable tablet Chew 125 mg by mouth every 6 (six) hours as needed for flatulence.    [provider]  temazepam (RESTORIL) 15 MG capsule Take 15 mg by mouth at bedtime.  12/02/17   [provider]    Allergies    Tylenol [acetaminophen], Codeine, Nsaids, Tolmetin, and Tramadol  Review of Systems   Review of Systems  Neurological: Positive for headaches.  All other systems reviewed and are negative.   Physical Exam Updated Vital Signs BP (!) 169/107 (BP Location: Right Arm)   Pulse 92   Temp 98.1 F (36.7 C) (Oral)   Resp 17   Ht 5\' 4"  (1.626 m)   Wt 47.6 kg   SpO2 97%   BMI 18.01 kg/m   Physical Exam Vitals and nursing note reviewed.   70 year  old female, resting comfortably and in no acute distress. Vital signs are significant for elevated blood pressure. Oxygen saturation is 97%, which is normal. Head is normocephalic and atraumatic. PERRLA, EOMI. Oropharynx is clear. Neck is nontender and supple without adenopathy or JVD. Back is nontender and there is no CVA tenderness. Lungs are clear without rales, wheezes, or rhonchi. Chest is nontender. Heart has regular rate and rhythm without murmur. Abdomen is soft, flat, nontender without masses or hepatosplenomegaly and peristalsis is normoactive. Extremities have no cyanosis or edema, full range of motion is present. Skin is warm and dry without rash. Neurologic: Awake and alert, oriented x4.  Cranial nerves, cranial nerves are intact, there are no motor or sensory deficits. Psychiatric: Slightly flat affect.  Hallucinations as noted above, but does not appear to be acting on internal stimuli.  ED Results / Procedures / Treatments   Labs (all labs ordered are listed, but only abnormal results are displayed) Labs Reviewed  CBC WITH DIFFERENTIAL/PLATELET - Abnormal; Notable for the following components:      Result Value   Platelets 408 (*)    Neutro Abs 8.6 (*)    All other components within normal limits  BASIC METABOLIC PANEL - Abnormal; Notable for the following components:   Glucose, Bld 130 (*)    All other components within normal limits  URINALYSIS, ROUTINE W REFLEX MICROSCOPIC - Abnormal; Notable for the following components:   Glucose, UA 150 (*)    Hgb urine dipstick SMALL (*)    Protein, ur 100 (*)    All other components within normal limits  RESP PANEL BY RT-PCR (FLU A&B, COVID) ARPGX2  HEPATIC FUNCTION PANEL  MAGNESIUM   Procedures Procedures   Medications Ordered in ED Medications  nicotine (NICODERM CQ - dosed in mg/24 hours) patch 14 mg (has no administration in time range)  alum & mag hydroxide-simeth (MAALOX/MYLANTA) 200-200-20 MG/5ML suspension 30 mL  (has no administration in time range)  ondansetron (ZOFRAN) tablet 4 mg (has no administration in time range)  albuterol (VENTOLIN HFA) 108 (90 Base) MCG/ACT inhaler 2 puff (has no administration in time range)  buprenorphine-naloxone (SUBOXONE) 8-2 mg per SL tablet 1 tablet (has no administration in time range)  carvedilol (COREG) tablet 6.25 mg (has no administration in time range)  diazepam (VALIUM) tablet 2 mg (has no administration in time range)  mometasone-formoterol (DULERA) 100-5 MCG/ACT inhaler 2 puff (has no administration in time range)  pantoprazole (PROTONIX) EC tablet 40 mg (has no administration in time range)  ondansetron (ZOFRAN-ODT) disintegrating tablet 8 mg (8 mg Oral Given 06/30/20 9735)    ED Course  I have reviewed the triage vital signs and the nursing notes.  Pertinent labs & imaging results that were available during my care of the patient were reviewed by me and considered in my medical decision making (see chart for details).  MDM Rules/Calculators/A&P Hallucinations.  Frontal headache.  Old records are reviewed, and she does have prior ED visit for psychosis.  Initial labs show borderline thrombocytosis.  Will request TTS consultation.  Final Clinical Impression(s) / ED Diagnoses Final diagnoses:  Hallucinations  Frontal headache  Thrombocytosis    Rx / DC Orders ED Discharge Orders    None       Delora Fuel, MD 32/99/24 [phone]

## 2020-06-30 NOTE — ED Notes (Signed)
The pt is eating two trays  I offered to heat it all up but she reports that she will eat it as it is.  Coke given to drint at her request
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
## 2020-06-30 NOTE — Progress Notes (Addendum)
ADDENDUM  In addition to list below, CSW sent referral to [facility] for admission consideration.    Signed:  Durenda Hurt, MSW, [place], [place] 06/30/2020 1:27 PM  Patient recommended for geriatric behavioral health inpatient per Beatriz Stallion, NP. CSW to assist in placement. Patient was referred to the following geriatric facilities:   Destination  Service Provider Address Phone Fax  [facility]  [address], [place] [address] [phone]  [place], [place] [address] [phone]  [facility]  [place], [place] [address] [phone]  [facility]  [address], [place] [address] [phone] [facility]  [address], [place] [address] [phone]  [facility]  [address] [place] [address] [phone] [place]  [address] [place] [address] [facility]  [address] [place] [address] [phone]  [facility]  [address] [place] [address] [place]  [facility]  [place], [place] [address] [place]  [facility]  [address] [place] [address] [phone]  [facility]  [address] [place] [address] [phone]   Signed:  Durenda Hurt, MSW, [place], [place] 06/30/2020 11:37 AM

## 2020-06-30 NOTE — BH Assessment (Signed)
DISPOSITION: Gave clinical report to Beatriz Stallion, FNP who determined Pt does meet criteria for inpatient [facility] psychiatric treatment. Leonia Reader, AC at [facility] confirmed an appropriate bed is not currently available. Other facilities will be contacted for placement. Notified Dr.David Roxanne Mins , MD and Fayrene Fearing ,RN of disposition recommendation and the sitter utilization recommendation.

## 2020-06-30 NOTE — ED Notes (Signed)
Pt was wanded by security.  

## 2020-06-30 NOTE — ED Triage Notes (Signed)
Pt BIBA from home for a psych eval per son. Son is stating patient is having hallucinations, wandering around the house and saying the devil is coming to get her. Pt has complaints of head pain and HTN, BP 156/102. Pt recently dc'd from a psych facility in [place]. Pt calm and cooperative in triage. Denies SI/HI. GCS 15.

## 2020-07-01 NOTE — Progress Notes (Addendum)
CSW followed up with [facility] in reference to a referral sent for this patient to be reviewed for placement. CSW spoke with Caryl Pina, RN who advised that the patient was denied yesterday due to needing further stabilization in the ED and a longer-term treatment plan. It was reported that the patient was there for 41 days and during that time the son of the patient mentioned he is open to suggestions for assistant living facilities in regards to his mother.   Glennie Isle, MSW, [place], LCAS-A Phone: [phone] Disposition/TOC

## 2020-07-01 NOTE — BH Assessment (Signed)
This Probation officer met with patient this date to assess current mental health status. Patient is somewhat disorganized and is unaware of month/day/year yet states she is in [place] and the current president is [place]. Patient is slow to respond to this writer's questions and states she is here "because she is nervous I guess." Per notes patient was initially assessed on 06/30/20 when she presented with hallucinations endorsing that she was seeing the [place] and Jesus. Patient has been recommended for a inpatient admission and is currently under review at [facility]

## 2020-07-01 NOTE — ED Notes (Signed)
Pt c/o anxiety, reassurance given, requesting valium, will medicate

## 2020-07-01 NOTE — ED Notes (Signed)
Spoke with [place] in pharmacy, she states she is working on getting pt's subutex dose for this morning

## 2020-07-01 NOTE — Progress Notes (Signed)
Per Adonis Huguenin, patient meets criteria for inpatient treatment. There are no available or appropriate beds at [facility] today. CSW faxed referrals to the following facilities for review:  [facility]   TTS will continue to seek bed placement.  Glennie Isle, MSW, [place], LCAS-A Phone: [phone] Disposition/TOC

## 2020-07-01 NOTE — ED Provider Notes (Signed)
Emergency Medicine Observation Re-evaluation Note  [name] is a 70 y.o. female, seen on rounds today.  Pt initially presented to the ED for complaints of Headache and Psychiatric Evaluation Currently, the patient is asleep in bed but responds to verbal stimulus.  She is pleasant and answers all questions.  States that she has had plenty to eat.  Physical Exam  BP (!) 114/95 (BP Location: Right Arm)   Pulse 86   Temp 98.3 F (36.8 C) (Oral)   Resp 17   Ht 5\' 4"  (1.626 m)   Wt 47.6 kg   SpO2 95%   BMI 18.01 kg/m   CONSTITUTIONAL:  well-appearing, NAD NEURO:  Alert and oriented x 3, no focal deficits EYES:  pupils equal and reactive ENT/NECK:  trachea midline, no JVD CARDIO:  reg rate, reg rhythm, well-perfused PULM:  None labored breathing GI/GU:  Abdomen non-distended MSK/SPINE:  No gross deformities, no edema SKIN:  no rash obvious, atraumatic, no ecchymosis  PSYCH:   Pleasant, appropriate speech   ED Course / MDM  EKG:EKG Interpretation  Date/Time:  Friday June 30 2020 07:47:58 EDT Ventricular Rate:  101 PR Interval:  137 QRS Duration: 95 QT Interval:  368 QTC Calculation: 477 R Axis:   52 Text Interpretation: Sinus tachycardia Right atrial enlargement Otherwise within normal limits When compared with ECG of 05/06/2020, No significant change was found Confirmed by Delora Fuel ([id number]) on 07/01/2020 1:04:43 AM   I have reviewed the labs performed to date as well as medications administered while in observation.  Recent changes in the last 24 hours include no changes.  Plan  Current plan is for geriatric psychiatric placement Patient is not under full IVC at this time.   Pati Gallo Schubert, [place] 07/01/20 9811    Sherwood Gambler, MD 07/01/20 1246

## 2020-07-01 NOTE — ED Notes (Signed)
Dave with TTS in with pt

## 2020-07-01 NOTE — ED Notes (Signed)
I spoke with Elberta Fortis, pts son and update given

## 2020-07-02 MED ORDER — ACETAMINOPHEN 500 MG PO TABS
1000.0000 mg | ORAL_TABLET | Freq: Once | ORAL | Status: AC
  Administered 2020-07-02: 1000 mg via ORAL
  Filled 2020-07-02: qty 2

## 2020-07-02 NOTE — ED Notes (Signed)
Per pt states that she takes tazepam at night and that she needs that med advised would speak with provider and pharmacy reference same

## 2020-07-02 NOTE — ED Notes (Signed)
Pt c/o headache at this time. Spoke with provider and received orders

## 2020-07-02 NOTE — Progress Notes (Addendum)
Per [name], patient meets criteria for inpatient treatment. There are no appropriate beds at [facility] today. CSW re-faxed referrals to the following facilities for review:  [facility]  TTS will continue to seek bed placement.  Glennie Isle, MSW, [place], LCAS-A Phone: [phone] Disposition/TOC

## 2020-07-02 NOTE — ED Notes (Signed)
Pt moved from ER2 to [place] bed 20 via stretcher

## 2020-07-02 NOTE — ED Notes (Signed)
Continues to pace back and forth in the hallway. Half to give her reeducation on where she should be in reference to hallway and bed. Will stop occasional and mess with her sheets and blankets on the bed

## 2020-07-02 NOTE — ED Notes (Signed)
Regular lunch tray ordered 

## 2020-07-02 NOTE — ED Notes (Signed)
Pt pacing back in forth in the hallway at this time

## 2020-07-02 NOTE — ED Notes (Signed)
Pt provided something to drink at this time. Continues to pace back in forth in the hallway. Provided skid free socks at this time for her to wear

## 2020-07-02 NOTE — ED Notes (Signed)
Received verbal report from Ryan H RN at this time 

## 2020-07-02 NOTE — BHH Counselor (Addendum)
TTS attempted to call and secure chat patient's nurse Trudee Kuster, no response. Will attempt TTS re-assessment later this date.

## 2020-07-02 NOTE — BH Assessment (Signed)
From assessment:  Pt's son brought her into the ED for evaluation due to apparent delusion and hallucination.  [name] stated last night she was talking to demons and devils , she was wandering around the home, and trying to walk to the woods suggesting that satan was there to get her. [name] stated that his mother has not slept in 72 hours because she is constantly walking and talking to demons.   Pt was reassessed today.  Pt was calm, sitting upright.  She stated that she wanted to go home.  Pt was oriented to place and time, though not day.   Pt stated that she is not hallucinating, wants to go home.  Consulted with [name], NP, who determined that, based on report of recent behavior, PT continue to meet inpatient criteria.

## 2020-07-03 ENCOUNTER — Encounter (HOSPITAL_COMMUNITY): Payer: Self-pay | Admitting: Registered Nurse

## 2020-07-03 DIAGNOSIS — F333 Major depressive disorder, recurrent, severe with psychotic symptoms: Secondary | ICD-10-CM

## 2020-07-03 DIAGNOSIS — R443 Hallucinations, unspecified: Secondary | ICD-10-CM

## 2020-07-03 MED ORDER — TEMAZEPAM 7.5 MG PO CAPS
22.5000 mg | ORAL_CAPSULE | Freq: Every evening | ORAL | Status: DC | PRN
  Administered 2020-07-03: 22.5 mg via ORAL
  Filled 2020-07-03: qty 3

## 2020-07-03 MED ORDER — OLANZAPINE 5 MG PO TBDP
10.0000 mg | ORAL_TABLET | Freq: Every day | ORAL | Status: DC
  Filled 2020-07-03: qty 2

## 2020-07-03 MED ORDER — OLANZAPINE 10 MG PO TBDP: 10.0000 mg | ORAL_TABLET | Freq: Every day | ORAL | 0 refills | Status: DC

## 2020-07-03 NOTE — NC FL2 (Signed)
[place] LEVEL OF CARE SCREENING TOOL     IDENTIFICATION  Patient Name: [name] Birthdate: 08/27/1950 Sex: female Admission Date (Current Location): 06/30/2020  [facility] and [place] Number:  Herbalist and Address:  [facility] [place] [place] [address] [place], [place] [address]      Provider Number: [id number]  Attending Physician Name and Address:  Default, Provider, MD  Relative Name and Phone Number:  Darylene Price [phone]    Current Level of Care: Hospital Recommended Level of Care: [place] Prior Approval Number:    Date Approved/Denied:   PASRR Number:    Discharge Plan: Home    Current Diagnoses: Patient Active Problem List   Diagnosis Date Noted  . MDD (major depressive disorder), recurrent, severe, with psychosis ([place]) 07/03/2020  . Hallucinations   . Shortness of breath 04/20/2020  . Chronic diastolic CHF (congestive heart failure) ([place]) 04/20/2020  . Nausea & vomiting 04/20/2020  . Prolonged QT interval 04/20/2020  . COPD with acute exacerbation ([place]) 06/20/2017  . DOE (dyspnea on exertion) 06/19/2017  . Cervical dystonia 11/27/2016  . C. difficile colitis 09/10/2016  . Abdominal pain 09/08/2016  . Acute encephalopathy   . Fever   . Left hemiparesis ([place])   . Generalized OA   . Fibromyalgia   . Tobacco abuse   . Substance abuse ([place])   . Benign essential HTN   . Tachycardia   . Chronic pain syndrome   . Acute blood loss anemia   . Leukocytosis   . Septic thrombophlebitis of sagittal sinus   . Acute respiratory failure ([place])   . Streptococcus pneumoniae meningitis   . Streptococcal bacteremia   . Meningitis   . Cerebral embolism with cerebral infarction 04/12/2016  . Stroke (cerebrum) ([place]) 04/12/2016  . Neck pain 04/04/2016  . Chronic abdominal pain 02/19/2016  . Essential hypertension 02/19/2016  . Bacterial conjunctivitis 02/19/2016  . Health care maintenance  02/19/2016  . Anxiety 05/08/2006  . Cigarette smoker 05/08/2006  . ASTHMA, UNSPECIFIED 05/08/2006  . GERD (gastroesophageal reflux disease) 05/08/2006  . CONSTIPATION 05/08/2006  . CONVULSIONS, SEIZURES, NOS 05/08/2006    Orientation RESPIRATION BLADDER Height & Weight     Self,Time,Situation,Place  Normal Continent Weight: 104 lb 15 oz (47.6 kg) Height:  5\' 4"  (162.6 cm)  BEHAVIORAL SYMPTOMS/MOOD NEUROLOGICAL BOWEL NUTRITION STATUS      Continent Diet (Regular)  AMBULATORY STATUS COMMUNICATION OF NEEDS Skin   Independent Verbally Normal                       Personal Care Assistance Level of Assistance              Functional Limitations Info  Sight,Hearing,Speech Sight Info: Adequate Hearing Info: Adequate Speech Info: Adequate    SPECIAL CARE FACTORS FREQUENCY                       Contractures Contractures Info: Not present    Additional Factors Info  Code Status,Allergies Code Status Info: DNR Allergies Info: (5) Tolmetin, Tramadol,Tylenol, Nsaids, Codeine           Current Medications (07/03/2020):  This is the current hospital active medication list Current Facility-Administered Medications  Medication Dose Route Frequency Provider Last Rate Last Admin  . albuterol (VENTOLIN HFA) 108 (90 Base) MCG/ACT inhaler 2 puff  2 puff Inhalation O7F PRN Delora Fuel, MD      . alum & Iris Pert  hydroxide-simeth (MAALOX/MYLANTA) 200-200-20 MG/5ML suspension 30 mL  30 mL Oral F5D PRN Delora Fuel, MD      . buprenorphine (SUBUTEX) SL tablet 6 mg  6 mg Sublingual Daily Delora Fuel, MD   6 mg at 07/03/20 1016  . carvedilol (COREG) tablet 6.25 mg  6.25 mg Oral BID WC Delora Fuel, MD   3.22 mg at 07/03/20 0953  . diazepam (VALIUM) tablet 2 mg  2 mg Oral QID PRN Delora Fuel, MD   2 mg at 07/03/20 0254  . mometasone-formoterol (DULERA) 100-5 MCG/ACT inhaler 2 puff  2 puff Inhalation BID Delora Fuel, MD   2 puff at 07/02/20 2022  . nicotine (NICODERM CQ - dosed in  mg/24 hours) patch 14 mg  14 mg Transdermal Daily Delora Fuel, MD   14 mg at 06/30/20 0944  . OLANZapine zydis (ZYPREXA) disintegrating tablet 10 mg  10 mg Oral QHS Rankin, Shuvon B, NP      . ondansetron (ZOFRAN) tablet 4 mg  4 mg Oral Y7C PRN Delora Fuel, MD      . pantoprazole (PROTONIX) EC tablet 40 mg  40 mg Oral Daily Delora Fuel, MD   40 mg at 07/03/20 0953  . temazepam (RESTORIL) capsule 22.5 mg  22.5 mg Oral QHS PRN Veryl Speak, MD   22.5 mg at 07/03/20 0108   Current Outpatient Medications  Medication Sig Dispense Refill  . albuterol (VENTOLIN HFA) 108 (90 Base) MCG/ACT inhaler Inhale 2 puffs into the lungs every 6 (six) hours as needed for wheezing or shortness of breath. 8 g 0  . buprenorphine (SUBUTEX) 2 MG SUBL SL tablet Place 6 mg under the tongue daily.    . carvedilol (COREG) 6.25 MG tablet Take 6.25 mg by mouth 2 (two) times daily with a meal.  6  . diazepam (VALIUM) 2 MG tablet Take 2 mg by mouth 4 (four) times daily as needed for anxiety.    Marland Kitchen esomeprazole (NEXIUM) 40 MG capsule Take 40 mg by mouth daily at 12 noon.    . mometasone-formoterol (DULERA) 100-5 MCG/ACT AERO Inhale 2 puffs into the lungs 2 (two) times daily. 1 each 0  . OLANZapine zydis (ZYPREXA) 10 MG disintegrating tablet Take 10 mg by mouth at bedtime.    . simethicone (MYLICON) 623 MG chewable tablet Chew 125 mg by mouth every 6 (six) hours as needed for flatulence.    . temazepam (RESTORIL) 22.5 MG capsule Take 22.5 mg by mouth at bedtime.  0  . pantoprazole (PROTONIX) 40 MG tablet Take 1 tablet (40 mg total) by mouth daily. (Patient not taking: No sig reported) 90 tablet 3  . polyethylene glycol (MIRALAX) packet Take 17 g by mouth daily. (Patient not taking: No sig reported) 14 each 0  . scopolamine (TRANSDERM-SCOP) 1 MG/3DAYS Place 1 patch (1.5 mg total) onto the skin every 3 (three) days. (Patient not taking: No sig reported) 10 patch 0     Discharge Medications: Please see discharge summary for a  list of discharge medications.  Relevant Imaging Results:  Relevant Lab Results:   Additional Information SSN# [id number]  Vergie Living, LCSW

## 2020-07-03 NOTE — Discharge Instructions (Signed)
[facility] [place] [email or web address] [place], [place], [place] [address]   [phone]  [facility] [place], [place], [place] [address]   [phone]  [facility]  [address], [place],  [address]  [phone]

## 2020-07-03 NOTE — ED Notes (Signed)
TTS is ready to assess pt. Pt moved to yellow for assessment.

## 2020-07-03 NOTE — ED Notes (Signed)
Pt placed in yellow ready for TTS. No answer from NP via secure chat. Secretary calling TTS to let them know she is ready.

## 2020-07-03 NOTE — Consult Note (Signed)
  1:15 PM sent Secure message sent to patients nurse Sammuel Bailiff, RN and Thurmond Butts, RN: Want to set up tele psych for this patient. Send message when machine is in room  [place], RN replied that EMS patient coming in and unable to set up at this time.    No response from [employer], RN   Medication management:  Restarted home psychotropic medications:  Zyprexa 10 mg Q hs  Patient recommended for geropsychiatric  inpatient treatment.

## 2020-07-03 NOTE — ED Notes (Addendum)
Pt wants to go home and requesting to see MD. Provider asked TTS to reassess pt. Messaged TTS, waiting for response.

## 2020-07-03 NOTE — Progress Notes (Addendum)
Patient meets inpatient criteria per Margorie John, PA.  Patient does not meet qualifications for a bed at [facility] due to needing a geropsych bed.   This social worker called [facility] and was informed that they had geropsych beds.  SW informed intake coordinator that patient information would be faxed over for review.   Patient was referred to the following facilities:   Destination  Patient indicates having no preference.  Service Provider Address Phone Fax  [facility]  [address] [place] [address] [phone]  [facility] [facility] [place], [place] [address] [phone]  [facility]  [place], [place] [address] [phone]  [facility]  [address], [place] [address] [phone] [facility]  [address] [place] [address] [phone] [place]  [address] [place] [address] [phone] [place], [place] [address] [place]  [facility]  [address], [place] [address] [phone]  [facility]  [address], [place] [address] [phone]  [facility]  [place], [place] [address] [phone]  [facility]  [address], [place] [address] [phone]  [facility]  [address], [place] [address] [phone] [place], [place] [address] [phone] [facility]  [address] [place] [address] [phone] [facility]  [address] [place] [address] [phone] [place]  [place], [place] [address] [phone]  [facility]  [place] [address] [phone] [place]  [address] Madelaine Bhat [place] [address] [phone]  [facility]  [address], [place] [address] [phone]       CSW will continue to monitor for disposition.  Riki Altes, MSW, LCSW, LCAS 07/03/2020 10:11 AM

## 2020-07-03 NOTE — Consult Note (Signed)
Telepsych Consultation   Reason for Consult:  Delusions and hallucinations Referring Physician:  Delora Fuel, MD Location of Patient: [facility] ED Location of Provider: Other: [facility]  Patient Identification: [name] MRN:  [id number] Principal Diagnosis: MDD (major depressive disorder), recurrent, severe, with psychosis ([place]) Diagnosis:  Principal Problem:   MDD (major depressive disorder), recurrent, severe, with psychosis ([place])   Total Time spent with patient: 30 minutes  Subjective:   [name] is a 70 y.o. female patient admitted to [facility] ED after she was brought in by her son with complaints of having delusions and hallucinations.Marland Kitchen  HPI:  [name], 70 y.o., female patient seen via tele health by this provider, consulted with Dr. Hampton Abbot; and chart reviewed on 07/03/20.  On evaluation [name] reports she is feeling better and that she is ready to go home.  "I have been here in the hospital for 3 or for days in a hall way taking a pill every now and then.  I can do that at home."  Patient asked what happened to have her brought to the hospital and she stated "For a couple of days my mind was cloudy, messed up and I went to my son and told him about it."  Patient states she was recently discharged from [facility] where she was treated for the "same" and was given prescriptions for medications that she hasn't been able to fill all of them.  States that he mind is clear "My mind is clearer than it's been in a while now even better than yesterday."  Patient stated she wanted to talk about the medications she was suppose to take at home.  Discussed  Zyprexa: Medication that would help mind stay clear, keep her from hearing and seeing things and if did not get filled my be why she was brought back to hospital Valium:  Should have been stopped 05/2020.  States she has been taking while in hospital this time because it has been stressful couple days Restoril:  Taking to  help with sleep.  "I sleep better when I take the Restoril Trazodone: Is to help with sleep Subtex:  "I'm taking that for pain."  Patient states she is treated at a pain clinic. Patient encourage to get all prescriptions filled.  Patient states that she lives with her son and is able to perform her ADL without assistance.   During evaluation [name] is sitting up in chair in no acute distress.  She is alert, oriented x 4.  Patient was able to give correct information for date of birth, age, current place, city, county, state, month, year, and last 3 presidents.  calm and cooperative.  Her mood is anxious related to wanting to go home and get out of hall bed.  She does not appear to be responding to internal/external stimuli or delusional thoughts.  Patient denies suicidal/self-harm/homicidal ideation, psychosis, and paranoia.  Patient answered question appropriately.   Collateral Information:   [name] home number [phone] / mobile number [phone].  [name] states that patient does live with him but he can't have her in his home related to having a 68 yr old and 37 yr old daughters in the home.  "I don't want to take a chance of her hurting them.  This time she thought they were demons."  [name] states he has been trying to work with a Education officer, museum to get placement for patient but she needs a FL 2.  States he would  also like to have an MRI of head.  "The last time she had something like this happen she had meningitis.  They was going to let her expire until a neurologist was called in and an MRI was done and discovered she had meningitis.  She has been quite right since.  I don't want to abuse the emergency system but I don't want to bring her home and have to bring her back."  Discussed with [name] that patient had not filled her medications that were prescribed after discharged [facility] and the Zyprexa was one of the medications that help keep her mind  clear and that it is possible that decompensated because she wasn't taking her medications and to make sure all were filled after discharged.  Informed would ask ED doctor to give prescription for Zyprexa since he doesn't know where it is or if she has the prescription.  [name] states patient was referred to [facility] in [place] but refused to see her so patient doesn't have outpatient psychiatric services.  [facility] for a primary doctor.    Past Psychiatric History: major depression with psychosis, history of substance abuse  Risk to Self:  Denies Risk to Others:   Denies Prior Inpatient Therapy:  Yes.  Most recent [place] for psychiatric admission Prior Outpatient Therapy:   Yes.  Unsure if patient currently has outpatient psychiatric services.  Will order social work/TOC to assess and refer if doesn't currently have or assist with scheduling follow up with current psychiatric provider..  In March 2022 when [facility] was [facility] [phone] with an appointment 06/22/20 walk in and [facility] [phone] appointment 07/25/20 at 3:00 PM  Past Medical History:  Past Medical History:  Diagnosis Date  . Acute blood loss anemia   . Arrhythmia   . Arthritis   . C. difficile colitis   . Cancer (HCC)   . Diverticulosis   . Fibromyalgia   . GERD (gastroesophageal reflux disease)   . GI hemorrhage   . H/O: substance abuse (HCC) 2005   narcotic usage due to chronic back pain  . Heart murmur   . Hypertension   . Pancreatic cyst   . Pneumococcal meningitis   . PONV (postoperative nausea and vomiting)   . Renal cyst   . Stroke (cerebrum) [facility](HCC)     Past Surgical History:  Procedure Laterality Date  . ABDOMINAL HYSTERECTOMY    . ABDOMINAL SURGERY    . APPENDECTOMY    . BIOPSY  04/22/2020   Procedure: BIOPSY;  Surgeon: Rachael FeeJacobs, Daniel P, MD;  [facility];  Service:  Gastroenterology;;  . BREAST SURGERY    . CHOLECYSTECTOMY N/A 09/30/2012   Procedure: LAPAROSCOPIC CHOLECYSTECTOMY WITH INTRAOPERATIVE CHOLANGIOGRAM;  Surgeon: Wilmon ArmsMatthew K. Corliss Skainssuei, MD;  Location: [facility];  Service: General;  Laterality: N/A;  . ESOPHAGOGASTRODUODENOSCOPY (EGD) WITH PROPOFOL N/A 04/22/2020   Procedure: ESOPHAGOGASTRODUODENOSCOPY (EGD) WITH PROPOFOL;  Surgeon: Rachael FeeJacobs, Daniel P, MD;  Location: [facility];  Service: Gastroenterology;  Laterality: N/A;  . FRACTURE SURGERY     right upper arm  . OVARIAN CYST SURGERY     Family History:  Family History  Problem Relation Age of Onset  . Alzheimer's disease Mother   . Heart disease Father   . Prostate cancer Father    Family Psychiatric  History: None reported Social History:  Social History   Substance and Sexual Activity  Alcohol Use No  Social History   Substance and Sexual Activity  Drug Use No   Comment: Methadone for narcotic use    Social History   Socioeconomic History  . Marital status: Married    Spouse name: Not on file  . Number of children: Not on file  . Years of education: Not on file  . Highest education level: Not on file  Occupational History  . Not on file  Tobacco Use  . Smoking status: Current Every Day Smoker    Packs/day: 0.75    Years: 49.00    Pack years: 36.75    Types: Cigarettes  . Smokeless tobacco: Never Used  Vaping Use  . Vaping Use: Never used  Substance and Sexual Activity  . Alcohol use: No  . Drug use: No    Comment: Methadone for narcotic use  . Sexual activity: Not Currently  Other Topics Concern  . Not on file  Social History Narrative  . Not on file   Social Determinants of Health   Financial Resource Strain: Not on file  Food Insecurity: Not on file  Transportation Needs: Not on file  Physical Activity: Not on file  Stress: Not on file  Social Connections: Not on file   Additional Social History:    Allergies:   Allergies  Allergen Reactions  .  Tylenol [Acetaminophen] Other (See Comments)    Inflammed Liver  . Codeine Nausea And Vomiting  . Nsaids Other (See Comments)    Stomach ulcers  . Tolmetin Other (See Comments)    Stomach ulcers  . Tramadol     GI UPSET    Labs: No results found for this or any previous visit (from the past 48 hour(s)).  Medications:  Current Facility-Administered Medications  Medication Dose Route Frequency Provider Last Rate Last Admin  . albuterol (VENTOLIN HFA) 108 (90 Base) MCG/ACT inhaler 2 puff  2 puff Inhalation S0Y PRN Delora Fuel, MD      . alum & mag hydroxide-simeth (MAALOX/MYLANTA) 200-200-20 MG/5ML suspension 30 mL  30 mL Oral T0Z PRN Delora Fuel, MD      . buprenorphine (SUBUTEX) SL tablet 6 mg  6 mg Sublingual Daily Delora Fuel, MD   6 mg at 07/03/20 1016  . carvedilol (COREG) tablet 6.25 mg  6.25 mg Oral BID WC Delora Fuel, MD   6.01 mg at 07/03/20 0953  . diazepam (VALIUM) tablet 2 mg  2 mg Oral QID PRN Delora Fuel, MD   2 mg at 07/03/20 0932  . mometasone-formoterol (DULERA) 100-5 MCG/ACT inhaler 2 puff  2 puff Inhalation BID Delora Fuel, MD   2 puff at 07/02/20 2022  . nicotine (NICODERM CQ - dosed in mg/24 hours) patch 14 mg  14 mg Transdermal Daily Delora Fuel, MD   14 mg at 06/30/20 0944  . OLANZapine zydis (ZYPREXA) disintegrating tablet 10 mg  10 mg Oral QHS Azekiel Cremer B, NP      . ondansetron (ZOFRAN) tablet 4 mg  4 mg Oral T5T PRN Delora Fuel, MD      . pantoprazole (PROTONIX) EC tablet 40 mg  40 mg Oral Daily Delora Fuel, MD   40 mg at 07/03/20 0953  . temazepam (RESTORIL) capsule 22.5 mg  22.5 mg Oral QHS PRN Veryl Speak, MD   22.5 mg at 07/03/20 0108   Current Outpatient Medications  Medication Sig Dispense Refill  . albuterol (VENTOLIN HFA) 108 (90 Base) MCG/ACT inhaler Inhale 2 puffs into the lungs every 6 (six) hours as needed  for wheezing or shortness of breath. 8 g 0  . buprenorphine (SUBUTEX) 2 MG SUBL SL tablet Place 6 mg under the tongue daily.    .  carvedilol (COREG) 6.25 MG tablet Take 6.25 mg by mouth 2 (two) times daily with a meal.  6  . diazepam (VALIUM) 2 MG tablet Take 2 mg by mouth 4 (four) times daily as needed for anxiety.    Marland Kitchen esomeprazole (NEXIUM) 40 MG capsule Take 40 mg by mouth daily at 12 noon.    . mometasone-formoterol (DULERA) 100-5 MCG/ACT AERO Inhale 2 puffs into the lungs 2 (two) times daily. 1 each 0  . OLANZapine zydis (ZYPREXA) 10 MG disintegrating tablet Take 10 mg by mouth at bedtime.    . simethicone (MYLICON) 400 MG chewable tablet Chew 125 mg by mouth every 6 (six) hours as needed for flatulence.    . temazepam (RESTORIL) 22.5 MG capsule Take 22.5 mg by mouth at bedtime.  0  . pantoprazole (PROTONIX) 40 MG tablet Take 1 tablet (40 mg total) by mouth daily. (Patient not taking: No sig reported) 90 tablet 3  . polyethylene glycol (MIRALAX) packet Take 17 g by mouth daily. (Patient not taking: No sig reported) 14 each 0  . scopolamine (TRANSDERM-SCOP) 1 MG/3DAYS Place 1 patch (1.5 mg total) onto the skin every 3 (three) days. (Patient not taking: No sig reported) 10 patch 0    Musculoskeletal: Strength & Muscle Tone: within normal limits Gait & Station: normal Patient leans: N/A  Psychiatric Specialty Exam: Physical Exam Vitals and nursing note reviewed. Chaperone present: Sitter at bedside.  Constitutional:      General: She is not in acute distress.    Appearance: Normal appearance. She is not ill-appearing.  Cardiovascular:     Rate and Rhythm: Normal rate.  Pulmonary:     Effort: Pulmonary effort is normal.  Musculoskeletal:     Cervical back: Normal range of motion.  Neurological:     Mental Status: She is alert and oriented to person, place, and time.  Psychiatric:        Attention and Perception: Attention and perception normal. She does not perceive auditory or visual hallucinations.        Mood and Affect: Affect normal. Mood is anxious.        Speech: Speech normal.        Behavior:  Behavior normal. Behavior is cooperative.        Thought Content: Thought content normal. Thought content is not paranoid or delusional. Thought content does not include homicidal or suicidal ideation.        Cognition and Memory: Cognition and memory normal.        Judgment: Judgment normal.     Review of Systems  Constitutional: Negative.   HENT: Negative.   Eyes: Negative.   Respiratory: Negative.   Cardiovascular: Negative.   Gastrointestinal: Negative.   Genitourinary: Negative.   Musculoskeletal: Positive for arthralgias and myalgias.  Skin: Negative.   Neurological: Negative.   Hematological: Negative.   Psychiatric/Behavioral: Negative for agitation, behavioral problems, confusion (Denies), self-injury and suicidal ideas. Hallucinations: Denies at this time.  States mind is clear. Sleep disturbance: States she has a problem sleeping but Restoril helps.    Blood pressure (!) 135/56, pulse 72, temperature 97.9 F (36.6 C), temperature source Oral, resp. rate 16, height 5\' 4"  (1.626 m), weight 47.6 kg, SpO2 99 %.Body mass index is 18.01 kg/m.  General Appearance: Casual  Eye Contact:  Good  Speech:  Clear and Coherent and Normal Rate  Volume:  Normal  Mood:  Anxious  Affect:  Appropriate and Congruent  Thought Process:  Coherent, Goal Directed and Descriptions of Associations: Intact  Orientation:  Full (Time, Place, and Person)  Thought Content:  WDL  Suicidal Thoughts:  No  Homicidal Thoughts:  No  Memory:  Immediate;   Good Recent;   Good  Judgement:  Intact  Insight:  Present  Psychomotor Activity:  Normal  Concentration:  Concentration: Good and Attention Span: Good  Recall:  Good  Fund of Knowledge:  Good  Language:  Good  Akathisia:  No  Handed:  Right  AIMS (if indicated):     Assets:  Communication Skills Desire for Improvement Financial Resources/Insurance Housing Leisure Time Resilience Social Support  ADL's:  Intact  Cognition:  WNL  Sleep:       Patients' son feels that there may be medical concerns that is causing patient to have psychiatric issues and wants a MRI done.  Discussed the CT scan that was done in 05/2020 but still wanting MRI.  Wants patient to be placed in a nursing home.  Informed that social work would contact him to discuss the process of placement and what he needs to do.     Treatment Plan Summary: Plan Psychiatrically clear.  Social work/TOC to assist with psyciatric outpatient services and follow up.   Social work/TOC consult ordered and sent a secure message:  Patient need referral to outpatient psychiatric services and son wants to speak to social work related to nursing home placement and steps needed to take to make it happen.   Disposition:  Psychiatrically cleared No evidence of imminent risk to self or others at present.   Patient does not meet criteria for psychiatric inpatient admission. Supportive therapy provided about ongoing stressors. Discussed crisis plan, support from social network, calling 911, coming to the Emergency Department, and calling Suicide Hotline.  This service was provided via telemedicine using a 2-way, interactive audio and video technology.  Names of all persons participating in this telemedicine service and their role in this encounter. Name: Earleen Newport Role: NP  Name: Dr. Hampton Abbot Role: Psychiatrist  Name: [name] Role: Patient  Name: Dr. Dene Gentry Role: [place] sent a secure message informing:  Patient seen and psychiatrically cleared.  Patients' son wanting an MRI stating last time patient was like this she was diagnosed with meningitis.  He is also wanting patient to be placed in nursing home related to recent psychiatric issues.  Did not get her medications filled when discharged from [place].  Will need a prescription for Zyprexa Zydis 10 mg Q hs.  Discontinue the Valium.  Social work consult ordered to address outpatient psychiatric service referral  and nursing home placement    [place], NP 07/03/2020 4:53 PM

## 2020-07-03 NOTE — ED Notes (Signed)
Pt continues to pace back and forth in the hallway at this time.

## 2020-07-03 NOTE — ED Notes (Addendum)
Multiple attempts have been made to notify TTS and [place] that pt is ready. Pt moved back into hallway at this time d/t concern for pt being a flight risk with pt pacing and repeatedly stating, "I'm not committed, I can leave". I was able to talk pt into talking with [facility] assessor first. Explained that she would talk to them and then I would get a provider to talk to her. Will move pt to a room as soon as TTS is available.

## 2020-07-03 NOTE — ED Notes (Signed)
Reviewed discharge instructions with patient and son. Follow-up care and medications reviewed. Patient and son verbalized understanding. Patient A&Ox4, VSS, and ambulatory with steady gait upon discharge.

## 2020-07-03 NOTE — Social Work (Signed)
CSW met with Pt at bedside. Pt is pleasant, A&Ox4, currently working on adult [employer].  CSWspoke with son, [name] via phone _[phone],  [name] expressed frustration with struggling to find the proper care for Pt.  Family is currently applying for Medicaid and needs FL2.  CSW completed assisted living FL2, updated son.  Son states that he will come to pick up Pt after children's softball practice.

## 2020-07-03 NOTE — ED Provider Notes (Signed)
Patient evaluated by TTS team and ultimately cleared for further outpatient care.  Family agreeable to plan to take her home at this time.   Luna Fuse, MD 07/03/20 2012

## 2020-07-04 ENCOUNTER — Telehealth: Payer: Self-pay | Admitting: *Deleted

## 2020-07-04 NOTE — Telephone Encounter (Signed)
Pt son called regarding new Rx listed on AVS.  RNCM left secure chat for [facility] Provider to assist with getting pt and son Rx.

## 2020-07-06 ENCOUNTER — Telehealth: Payer: Self-pay | Admitting: *Deleted

## 2020-07-06 NOTE — Telephone Encounter (Signed)
Pt son returned call regarding mom's Rx.  Son states she was givenRx in ED that is different from what he has.  Son would like a call from Provider with Rx changes and new prescriptions.  RNCM forwarded request to EDP and NP that cared for pt in this setting.

## 2020-08-01 ENCOUNTER — Emergency Department: Payer: Medicare Other

## 2020-08-01 ENCOUNTER — Inpatient Hospital Stay
Admission: EM | Admit: 2020-08-01 | Discharge: 2020-08-06 | DRG: 190 | Disposition: A | Discharge status: Home Health | Payer: Medicare Other | Attending: Internal Medicine | Admitting: Internal Medicine

## 2020-08-01 ENCOUNTER — Other Ambulatory Visit: Payer: Self-pay

## 2020-08-01 DIAGNOSIS — M797 Fibromyalgia: Secondary | ICD-10-CM | POA: Diagnosis present

## 2020-08-01 DIAGNOSIS — I5032 Chronic diastolic (congestive) heart failure: Secondary | ICD-10-CM | POA: Diagnosis present

## 2020-08-01 DIAGNOSIS — Z8661 Personal history of infections of the central nervous system: Secondary | ICD-10-CM

## 2020-08-01 DIAGNOSIS — J440 Chronic obstructive pulmonary disease with acute lower respiratory infection: Secondary | ICD-10-CM | POA: Diagnosis present

## 2020-08-01 DIAGNOSIS — G894 Chronic pain syndrome: Secondary | ICD-10-CM | POA: Diagnosis present

## 2020-08-01 DIAGNOSIS — Z886 Allergy status to analgesic agent status: Secondary | ICD-10-CM

## 2020-08-01 DIAGNOSIS — J9601 Acute respiratory failure with hypoxia: Secondary | ICD-10-CM | POA: Diagnosis not present

## 2020-08-01 DIAGNOSIS — I10 Essential (primary) hypertension: Secondary | ICD-10-CM | POA: Diagnosis present

## 2020-08-01 DIAGNOSIS — J441 Chronic obstructive pulmonary disease with (acute) exacerbation: Secondary | ICD-10-CM | POA: Diagnosis not present

## 2020-08-01 DIAGNOSIS — Z8673 Personal history of transient ischemic attack (TIA), and cerebral infarction without residual deficits: Secondary | ICD-10-CM

## 2020-08-01 DIAGNOSIS — J189 Pneumonia, unspecified organism: Secondary | ICD-10-CM | POA: Diagnosis not present

## 2020-08-01 DIAGNOSIS — Z66 Do not resuscitate: Secondary | ICD-10-CM | POA: Diagnosis present

## 2020-08-01 DIAGNOSIS — F1910 Other psychoactive substance abuse, uncomplicated: Secondary | ICD-10-CM | POA: Diagnosis present

## 2020-08-01 DIAGNOSIS — F419 Anxiety disorder, unspecified: Secondary | ICD-10-CM | POA: Diagnosis present

## 2020-08-01 DIAGNOSIS — Z20822 Contact with and (suspected) exposure to covid-19: Secondary | ICD-10-CM | POA: Diagnosis present

## 2020-08-01 DIAGNOSIS — F333 Major depressive disorder, recurrent, severe with psychotic symptoms: Secondary | ICD-10-CM | POA: Diagnosis present

## 2020-08-01 DIAGNOSIS — Z885 Allergy status to narcotic agent status: Secondary | ICD-10-CM

## 2020-08-01 DIAGNOSIS — Z820 Family history of epilepsy and other diseases of the nervous system: Secondary | ICD-10-CM

## 2020-08-01 DIAGNOSIS — J449 Chronic obstructive pulmonary disease, unspecified: Secondary | ICD-10-CM | POA: Diagnosis present

## 2020-08-01 DIAGNOSIS — Z8249 Family history of ischemic heart disease and other diseases of the circulatory system: Secondary | ICD-10-CM

## 2020-08-01 DIAGNOSIS — Z681 Body mass index (BMI) 19 or less, adult: Secondary | ICD-10-CM

## 2020-08-01 DIAGNOSIS — R0603 Acute respiratory distress: Secondary | ICD-10-CM | POA: Diagnosis not present

## 2020-08-01 DIAGNOSIS — E43 Unspecified severe protein-calorie malnutrition: Secondary | ICD-10-CM | POA: Diagnosis present

## 2020-08-01 DIAGNOSIS — K219 Gastro-esophageal reflux disease without esophagitis: Secondary | ICD-10-CM | POA: Diagnosis present

## 2020-08-01 DIAGNOSIS — R0902 Hypoxemia: Secondary | ICD-10-CM

## 2020-08-01 DIAGNOSIS — I110 Hypertensive heart disease with heart failure: Secondary | ICD-10-CM | POA: Diagnosis present

## 2020-08-01 DIAGNOSIS — R9431 Abnormal electrocardiogram [ECG] [EKG]: Secondary | ICD-10-CM | POA: Diagnosis present

## 2020-08-01 DIAGNOSIS — Z79899 Other long term (current) drug therapy: Secondary | ICD-10-CM

## 2020-08-01 DIAGNOSIS — F17210 Nicotine dependence, cigarettes, uncomplicated: Secondary | ICD-10-CM | POA: Diagnosis present

## 2020-08-01 LAB — URINALYSIS, COMPLETE (UACMP) WITH MICROSCOPIC
Bilirubin Urine: NEGATIVE
Glucose, UA: NEGATIVE mg/dL
Ketones, ur: 5 mg/dL — AB
Leukocytes,Ua: NEGATIVE
Nitrite: NEGATIVE
Protein, ur: 30 mg/dL — AB
Specific Gravity, Urine: 1.023 (ref 1.005–1.030)
pH: 5 (ref 5.0–8.0)

## 2020-08-01 LAB — RESP PANEL BY RT-PCR (FLU A&B, COVID) ARPGX2
Influenza A by PCR: NEGATIVE
Influenza B by PCR: NEGATIVE
SARS Coronavirus 2 by RT PCR: NEGATIVE

## 2020-08-01 LAB — HEPATIC FUNCTION PANEL
ALT: 35 U/L (ref 0–44)
AST: 26 U/L (ref 15–41)
Albumin: 3.7 g/dL (ref 3.5–5.0)
Alkaline Phosphatase: 130 U/L — ABNORMAL HIGH (ref 38–126)
Bilirubin, Direct: 0.1 mg/dL (ref 0.0–0.2)
Indirect Bilirubin: 0.7 mg/dL (ref 0.3–0.9)
Total Bilirubin: 0.8 mg/dL (ref 0.3–1.2)
Total Protein: 7.4 g/dL (ref 6.5–8.1)

## 2020-08-01 LAB — CBC
HCT: 39.3 % (ref 36.0–46.0)
Hemoglobin: 13.3 g/dL (ref 12.0–15.0)
MCH: 29.2 pg (ref 26.0–34.0)
MCHC: 33.8 g/dL (ref 30.0–36.0)
MCV: 86.4 fL (ref 80.0–100.0)
Platelets: 522 K/uL — ABNORMAL HIGH (ref 150–400)
RBC: 4.55 MIL/uL (ref 3.87–5.11)
RDW: 13.1 % (ref 11.5–15.5)
WBC: 30.4 K/uL — ABNORMAL HIGH (ref 4.0–10.5)
nRBC: 0 % (ref 0.0–0.2)

## 2020-08-01 LAB — BASIC METABOLIC PANEL WITH GFR
Anion gap: 10 (ref 5–15)
BUN: 20 mg/dL (ref 8–23)
CO2: 29 mmol/L (ref 22–32)
Calcium: 9.4 mg/dL (ref 8.9–10.3)
Chloride: 99 mmol/L (ref 98–111)
Creatinine, Ser: 0.61 mg/dL (ref 0.44–1.00)
GFR, Estimated: >60 mL/min
Glucose, Bld: 131 mg/dL — ABNORMAL HIGH (ref 70–99)
Potassium: 3.6 mmol/L (ref 3.5–5.1)
Sodium: 138 mmol/L (ref 135–145)

## 2020-08-01 LAB — BLOOD GAS, VENOUS
Acid-Base Excess: 5.5 mmol/L — ABNORMAL HIGH (ref 0.0–2.0)
Bicarbonate: 31.6 mmol/L — ABNORMAL HIGH (ref 20.0–28.0)
O2 Saturation: 87.6 %
Patient temperature: 37
pCO2, Ven: 51 mmHg (ref 44.0–60.0)
pH, Ven: 7.4 (ref 7.250–7.430)
pO2, Ven: 54 mmHg — ABNORMAL HIGH (ref 32.0–45.0)

## 2020-08-01 LAB — LACTIC ACID, PLASMA
Lactic Acid, Venous: 1.1 mmol/L (ref 0.5–1.9)
Lactic Acid, Venous: 0.8 mmol/L (ref 0.5–1.9)

## 2020-08-01 LAB — APTT: aPTT: 25 seconds (ref 24–36)

## 2020-08-01 LAB — PROTIME-INR
INR: 1.1 (ref 0.8–1.2)
Prothrombin Time: 13.8 seconds (ref 11.4–15.2)

## 2020-08-01 LAB — TROPONIN I (HIGH SENSITIVITY)
Troponin I (High Sensitivity): 7 ng/L
Troponin I (High Sensitivity): 6 ng/L (ref <18)

## 2020-08-01 MED ORDER — GUAIFENESIN ER 600 MG PO TB12
600.0000 mg | ORAL_TABLET | Freq: Two times a day (BID) | ORAL | Status: DC | PRN
  Administered 2020-08-01: 22:00:00 600 mg via ORAL
  Filled 2020-08-01: qty 1

## 2020-08-01 MED ORDER — SODIUM CHLORIDE 0.9 % IV SOLN
2.0000 g | INTRAVENOUS | Status: AC
  Administered 2020-08-01 – 2020-08-05 (×5): 2 g via INTRAVENOUS
  Filled 2020-08-01 (×3): qty 20
  Filled 2020-08-01 (×2): qty 2

## 2020-08-01 MED ORDER — SODIUM CHLORIDE 0.9 % IV BOLUS: 1000.0000 mL | Freq: Once | INTRAVENOUS | Status: DC

## 2020-08-01 MED ORDER — ALBUTEROL SULFATE HFA 108 (90 BASE) MCG/ACT IN AERS: 2.0000 | INHALATION_SPRAY | Freq: Four times a day (QID) | RESPIRATORY_TRACT | Status: DC | PRN

## 2020-08-01 MED ORDER — ALBUTEROL SULFATE (2.5 MG/3ML) 0.083% IN NEBU
2.5000 mg | INHALATION_SOLUTION | RESPIRATORY_TRACT | Status: DC | PRN
  Administered 2020-08-01: 2.5 mg via RESPIRATORY_TRACT
  Filled 2020-08-01: qty 3

## 2020-08-01 MED ORDER — ENOXAPARIN SODIUM 40 MG/0.4ML IJ SOSY: 40.0000 mg | PREFILLED_SYRINGE | INTRAMUSCULAR | Status: DC

## 2020-08-01 MED ORDER — BUPRENORPHINE HCL 2 MG SL SUBL: 6.0000 mg | SUBLINGUAL_TABLET | Freq: Every day | SUBLINGUAL | Status: DC

## 2020-08-01 MED ORDER — MOMETASONE FURO-FORMOTEROL FUM 100-5 MCG/ACT IN AERO
2.0000 | INHALATION_SPRAY | Freq: Two times a day (BID) | RESPIRATORY_TRACT | Status: DC
  Administered 2020-08-01 – 2020-08-03 (×5): 2 via RESPIRATORY_TRACT
  Filled 2020-08-01: qty 8.8

## 2020-08-01 MED ORDER — IPRATROPIUM-ALBUTEROL 0.5-2.5 (3) MG/3ML IN SOLN: 3.0000 mL | Freq: Four times a day (QID) | RESPIRATORY_TRACT | Status: DC | PRN

## 2020-08-01 MED ORDER — PREDNISONE 20 MG PO TABS
40.0000 mg | ORAL_TABLET | Freq: Every day | ORAL | Status: DC
  Administered 2020-08-02: 08:00:00 40 mg via ORAL
  Filled 2020-08-01: qty 2

## 2020-08-01 MED ORDER — TEMAZEPAM 7.5 MG PO CAPS
22.5000 mg | ORAL_CAPSULE | Freq: Every day | ORAL | Status: DC
  Administered 2020-08-02 – 2020-08-05 (×5): 22.5 mg via ORAL
  Filled 2020-08-01 (×6): qty 3

## 2020-08-01 MED ORDER — ACETAMINOPHEN 325 MG PO TABS: 650.0000 mg | ORAL_TABLET | Freq: Four times a day (QID) | ORAL | Status: DC | PRN

## 2020-08-01 MED ORDER — SODIUM CHLORIDE 0.9 % IV SOLN
500.0000 mg | INTRAVENOUS | Status: AC
  Administered 2020-08-01 – 2020-08-05 (×5): 500 mg via INTRAVENOUS
  Filled 2020-08-01 (×5): qty 500

## 2020-08-01 MED ORDER — SODIUM CHLORIDE 0.9% FLUSH
3.0000 mL | Freq: Two times a day (BID) | INTRAVENOUS | Status: DC
  Administered 2020-08-01 – 2020-08-06 (×9): 3 mL via INTRAVENOUS

## 2020-08-01 MED ORDER — SIMETHICONE 80 MG PO CHEW
80.0000 mg | CHEWABLE_TABLET | Freq: Four times a day (QID) | ORAL | Status: DC | PRN
  Filled 2020-08-01: qty 1

## 2020-08-01 MED ORDER — POLYETHYLENE GLYCOL 3350 17 G PO PACK
17.0000 g | PACK | Freq: Every day | ORAL | Status: DC | PRN
  Administered 2020-08-04: 12:00:00 17 g via ORAL
  Filled 2020-08-01: qty 1

## 2020-08-01 MED ORDER — METHYLPREDNISOLONE SODIUM SUCC 125 MG IJ SOLR
125.0000 mg | INTRAMUSCULAR | Status: AC
  Administered 2020-08-02: 125 mg via INTRAVENOUS
  Filled 2020-08-01: qty 2

## 2020-08-01 MED ORDER — ENOXAPARIN SODIUM 30 MG/0.3ML IJ SOSY
30.0000 mg | PREFILLED_SYRINGE | INTRAMUSCULAR | Status: DC
  Administered 2020-08-01 – 2020-08-03 (×3): 30 mg via SUBCUTANEOUS
  Filled 2020-08-01 (×3): qty 0.3

## 2020-08-01 MED ORDER — ACETAMINOPHEN 650 MG RE SUPP: 650.0000 mg | Freq: Four times a day (QID) | RECTAL | Status: DC | PRN

## 2020-08-01 MED ORDER — SODIUM CHLORIDE 0.9 % IV BOLUS
500.0000 mL | Freq: Once | INTRAVENOUS | Status: AC
  Administered 2020-08-01: 500 mL via INTRAVENOUS

## 2020-08-01 MED ORDER — PANTOPRAZOLE SODIUM 40 MG PO TBEC
40.0000 mg | DELAYED_RELEASE_TABLET | Freq: Every day | ORAL | Status: DC
  Administered 2020-08-02 – 2020-08-06 (×5): 40 mg via ORAL
  Filled 2020-08-01 (×6): qty 1

## 2020-08-01 MED ORDER — CARVEDILOL 6.25 MG PO TABS
6.2500 mg | ORAL_TABLET | Freq: Two times a day (BID) | ORAL | Status: DC
  Administered 2020-08-02: 6.25 mg via ORAL
  Filled 2020-08-01: qty 1

## 2020-08-01 MED ORDER — BUPRENORPHINE HCL 2 MG SL SUBL
2.0000 mg | SUBLINGUAL_TABLET | Freq: Every day | SUBLINGUAL | Status: DC
  Administered 2020-08-02 – 2020-08-05 (×5): 2 mg via SUBLINGUAL
  Filled 2020-08-01 (×8): qty 1

## 2020-08-01 MED ORDER — BUPRENORPHINE HCL 2 MG SL SUBL
2.0000 mg | SUBLINGUAL_TABLET | Freq: Every day | SUBLINGUAL | Status: DC
Start: 2020-08-02 — End: 2020-08-01

## 2020-08-01 MED ORDER — DIAZEPAM 2 MG PO TABS
2.0000 mg | ORAL_TABLET | Freq: Four times a day (QID) | ORAL | Status: DC | PRN
  Administered 2020-08-01 – 2020-08-03 (×2): 2 mg via ORAL
  Filled 2020-08-01 (×2): qty 1

## 2020-08-01 MED ORDER — IPRATROPIUM-ALBUTEROL 0.5-2.5 (3) MG/3ML IN SOLN: 3.0000 mL | Freq: Once | RESPIRATORY_TRACT | Status: DC

## 2020-08-01 NOTE — Progress Notes (Signed)
PHARMACIST - PHYSICIAN COMMUNICATION  CONCERNING:  Enoxaparin (Lovenox) for DVT Prophylaxis   DESCRIPTION: Patient was prescribed enoxaprin 40mg  q24 hours for VTE prophylaxis.   Filed Weights   08/01/20 2059  Weight: 37 kg (81 lb 8 oz)    Body mass index is 13.99 kg/m.  Estimated Creatinine Clearance: 39.3 mL/min (by C-G formula based on SCr of 0.61 mg/dL).   Patient is candidate for enoxaparin 30mg  every 24 hours based on CrCl <66ml/min or Weight <45kg  RECOMMENDATION: Pharmacy has adjusted enoxaparin dose per [facility] policy.  Patient is now receiving enoxaparin 30 mg every 24 hours    Darnelle Bos, PharmD Clinical Pharmacist  08/01/2020 10:17 PM

## 2020-08-01 NOTE — ED Triage Notes (Signed)
Pt comes into the ED via EMS from home with c/o increased SOB with fatigue , fever for the past week with green sputum,  90%RA, 98% on 2L [place]  122/66 [id number]

## 2020-08-01 NOTE — ED Triage Notes (Signed)
Pt comes via EMS from home with c/o SOB for over week. Pt states green productive cough. Pt states increased weakness.  Pt on 2L [place] at this time

## 2020-08-01 NOTE — H&P (Addendum)
History and Physical   [name] BEE:[id number] DOB: [birth year] DOA: 08/01/2020  PCP: [name], MD   Patient coming from: Home  Chief Complaint: Shortness of breath  HPI: [name] is a 70 y.o. female with medical history significant of anxiety, depression, asthma, COPD, hypertension, C. difficile, CVA, cervical dystonia, chronic pain, fibromyalgia, seizures, GERD, prolonged QT, substance use who presents with ongoing shortness of breath.  Patient states that she shortness of breath in the past 1 to 2 weeks.  She reports a cough productive of green mucus.  She states she has had increasing fatigue and weakness.  She reports fever.  She states her symptoms have been fairly progressive and denies double sickening.  She states she does not take her inhalers regularly at home that she is prescribed.  She has tried over-the-counter medications at home without much relief. She additionally reports some pain in her right ribs with coughing and some chest tightness.  She denies chills, abdominal pain, constipation, diarrhea, nausea.  ED Course: Vital signs in the ED significant for desaturation to 89 to 90% on room air.  Improvement to 98% on 2 L.  Blood pressure in the 588T to 254D systolic.  Vital signs otherwise stable.  Lab work-up showed CMP with glucose 131, alk phos 130.  CBC showed leukocytosis to 3044 and platelets elevated at 522.  PT, PTT, INR within normal limits.  Lactic acid normal with repeat pending.  Troponin normal with repeat pending.  Respiratory panel for flu and COVID pending.  Urinalysis, urine culture, blood cultures pending.  VBG with pH 7.4 and PCO2 51.  Chest x-ray was performed and showed airspace disease of the right middle lobe concerning for pneumonia.  Patient was ordered 500 cc of IV fluids in the ED.  Review of Systems: As per HPI otherwise all other systems reviewed and are negative.  Past Medical History:  Diagnosis Date  . Acute blood loss anemia    . Arrhythmia   . Arthritis   . C. difficile colitis   . Cancer ([place])   . Diverticulosis   . Fibromyalgia   . GERD (gastroesophageal reflux disease)   . GI hemorrhage   . H/O: substance abuse ([place]) 2005   narcotic usage due to chronic back pain  . Heart murmur   . Hypertension   . Pancreatic cyst   . Pneumococcal meningitis   . PONV (postoperative nausea and vomiting)   . Renal cyst   . Stroke (cerebrum) [facility])     Past Surgical History:  Procedure Laterality Date  . ABDOMINAL HYSTERECTOMY    . ABDOMINAL SURGERY    . APPENDECTOMY    . BIOPSY  04/22/2020   Procedure: BIOPSY;  Surgeon: Milus Banister, MD;  Location: [facility];  Service: Gastroenterology;;  . BREAST SURGERY    . CHOLECYSTECTOMY N/A 09/30/2012   Procedure: LAPAROSCOPIC CHOLECYSTECTOMY WITH INTRAOPERATIVE CHOLANGIOGRAM;  Surgeon: Imogene Burn. Georgette Dover, MD;  Location: [facility];  Service: General;  Laterality: N/A;  . ESOPHAGOGASTRODUODENOSCOPY (EGD) WITH PROPOFOL N/A 04/22/2020   Procedure: ESOPHAGOGASTRODUODENOSCOPY (EGD) WITH PROPOFOL;  Surgeon: Milus Banister, MD;  Location: [facility];  Service: Gastroenterology;  Laterality: N/A;  . FRACTURE SURGERY     right upper arm  . OVARIAN CYST SURGERY      Social History  reports that she has been smoking cigarettes. She has a 36.75 pack-year smoking history. She has never used smokeless tobacco. She reports that she does not drink alcohol and does not use  drugs.  Allergies  Allergen Reactions  . Tylenol [Acetaminophen] Other (See Comments)    Inflammed Liver  . Codeine Nausea And Vomiting  . Nsaids Other (See Comments)    Stomach ulcers  . Tolmetin Other (See Comments)    Stomach ulcers  . Tramadol     GI UPSET    Family History  Problem Relation Age of Onset  . Alzheimer's disease Mother   . Heart disease Father   . Prostate cancer Father   Reviewed on admission  Prior to Admission medications   Medication Sig Start Date End Date Taking? Authorizing  Provider  albuterol (VENTOLIN HFA) 108 (90 Base) MCG/ACT inhaler Inhale 2 puffs into the lungs every 6 (six) hours as needed for wheezing or shortness of breath. 04/22/20   Hongalgi, Lenis Dickinson, MD  buprenorphine (SUBUTEX) 2 MG SUBL SL tablet Place 6 mg under the tongue daily. 04/15/20   [provider]  carvedilol (COREG) 6.25 MG tablet Take 6.25 mg by mouth 2 (two) times daily with a meal. 12/25/17   [provider]  diazepam (VALIUM) 2 MG tablet Take 2 mg by mouth 4 (four) times daily as needed for anxiety. 04/02/20   [provider]  esomeprazole (NEXIUM) 40 MG capsule Take 40 mg by mouth daily at 12 noon.    [provider]  mometasone-formoterol (DULERA) 100-5 MCG/ACT AERO Inhale 2 puffs into the lungs 2 (two) times daily. 04/22/20   Hongalgi, Lenis Dickinson, MD  OLANZapine zydis (ZYPREXA) 10 MG disintegrating tablet Take 1 tablet (10 mg total) by mouth at bedtime for 10 days. 07/03/20 07/13/20  Luna Fuse, MD  pantoprazole (PROTONIX) 40 MG tablet Take 1 tablet (40 mg total) by mouth daily. Patient not taking: No sig reported 04/01/17   Pyrtle, Lajuan Lines, MD  polyethylene glycol [facility]) packet Take 17 g by mouth daily. Patient not taking: No sig reported 01/04/18   Isla Pence, MD  scopolamine (TRANSDERM-SCOP) 1 MG/3DAYS Place 1 patch (1.5 mg total) onto the skin every 3 (three) days. Patient not taking: No sig reported 04/24/20   Modena Jansky, MD  simethicone (MYLICON) 174 MG chewable tablet Chew 125 mg by mouth every 6 (six) hours as needed for flatulence.    [provider]  temazepam (RESTORIL) 22.5 MG capsule Take 22.5 mg by mouth at bedtime. 12/02/17   [provider]    Physical Exam: Vitals:   08/01/20 1404 08/01/20 1405 08/01/20 1730 08/01/20 1830  BP: (!) 128/112  (!) 155/74 (!) 113/58  Pulse: 88  79 80  Resp: _0 Temp: 98.1 F (36.7 C)     TempSrc: Oral     SpO2: 94% 98% 99% 98%   Physical Exam Constitutional:       General: She is not in acute distress.    Appearance: Normal appearance.  HENT:     Head: Normocephalic and atraumatic.     Mouth/Throat:     Mouth: Mucous membranes are moist.     Pharynx: Oropharynx is clear.  Eyes:     Extraocular Movements: Extraocular movements intact.     Pupils: Pupils are equal, round, and reactive to light.  Cardiovascular:     Rate and Rhythm: Normal rate and regular rhythm.     Pulses: Normal pulses.     Heart sounds: Normal heart sounds.  Pulmonary:     Effort: Pulmonary effort is normal. No respiratory distress.     Breath sounds: Wheezing and rhonchi present.  Abdominal:     General: Bowel sounds are normal. There is no distension.     Palpations: Abdomen is soft.     Tenderness: There is no abdominal tenderness.  Musculoskeletal:        General: No swelling or deformity.  Skin:    General: Skin is warm and dry.  Neurological:     General: No focal deficit present.     Mental Status: Mental status is at baseline.    Labs on Admission: I have personally reviewed following labs and imaging studies  CBC: Recent Labs  Lab 08/01/20 1404  WBC 30.4*  HGB 13.3  HCT 39.3  MCV 86.4  PLT 522*    Basic Metabolic Panel: Recent Labs  Lab 08/01/20 1404  NA 138  K 3.6  CL 99  CO2 29  GLUCOSE 131*  BUN 20  CREATININE 0.61  CALCIUM 9.4    GFR: CrCl cannot be calculated (Unknown ideal weight.).  Liver Function Tests: Recent Labs  Lab 08/01/20 1739  AST 26  ALT 35  ALKPHOS 130*  BILITOT 0.8  PROT 7.4  ALBUMIN 3.7    Urine analysis:    Component Value Date/Time   COLORURINE YELLOW (A) 08/01/2020 1759   APPEARANCEUR HAZY (A) 08/01/2020 1759   LABSPEC 1.023 08/01/2020 1759   PHURINE 5.0 08/01/2020 1759   GLUCOSEU NEGATIVE 08/01/2020 1759   HGBUR SMALL (A) 08/01/2020 1759   BILIRUBINUR NEGATIVE 08/01/2020 1759   KETONESUR 5 (A) 08/01/2020 1759   PROTEINUR 30 (A) 08/01/2020 1759   UROBILINOGEN 0.2 05/26/2013 1730   NITRITE  NEGATIVE 08/01/2020 1759   LEUKOCYTESUR NEGATIVE 08/01/2020 1759    Radiological Exams on Admission: DG Chest 2 View  Result Date: 08/01/2020 CLINICAL DATA:  Productive cough and increasing shortness of breath over the past week. EXAM: CHEST - 2 VIEW COMPARISON:  PA and lateral chest 01/04/2018 and 05/06/2020. FINDINGS: There is airspace disease in the right middle lobe. Lungs are otherwise clear. Emphysema noted. Aortic atherosclerosis is seen. Heart size is normal. No pneumothorax or fluid. No acute focal bony abnormality. IMPRESSION: Airspace disease in the right middle lobe worrisome for pneumonia. Aortic Atherosclerosis (ICD10-I70.0) and Emphysema (ICD10-J43.9). Electronically Signed   By: Inge Rise M.D.   On: 08/01/2020 14:46   EKG: Independently reviewed.  It normal sinus rhythm at 89 bpm.  Baseline artifact in some leads.  Evidence of LVH.  QTc 462.  Assessment/Plan Active Problems:   Anxiety   GERD (gastroesophageal reflux disease)   Essential hypertension   History of stroke   Fibromyalgia   Substance abuse (HCC)   Chronic pain syndrome   COPD with acute exacerbation (HCC)   Chronic diastolic CHF (congestive heart failure) (HCC)   Prolonged QT interval   MDD (major depressive disorder), recurrent, severe, with psychosis ([place])   Acute respiratory failure with hypoxia ([place])  Pneumonia COPD with mild exacerbation Acute respiratory failure with hypoxia > Patient with a history of COPD presenting with ongoing shortness of breath with evidence of pneumonia on chest x-ray and leukocytosis to 30. > Desaturated to 89 to 90% in the ED, has never been on home oxygen.  Improved to 98% on 2 L. > Has tried over-the-counter medication at home without much relief. > In addition to rhonchi noted to have some bilateral wheezing on exam - Monitor on telemetry - Start ceftriaxone azithromycin for CAP - Continue with home Dulera and as needed albuterol - We will give DuoNeb now  considering wheezing - Continue supplemental  oxygen wean as able - Trend fever curve and white count  Substance use Chronic pain syndrome Fibromyalgia > Patient has a history of substance use in the setting of her chronic pain.  She is currently on Subutex 2 mg daily. - Continue with home Subutex  Prolonged QT > Patient has a history of prolonged QT current QTC is 462. - Avoid additional QTC prolonging medications, will continue with current home meds and antibiotics. - Recheck EKG in the morning  Hypertension Diastolic heart failure > Blood pressure 614E 315Q systolic in the ED.  Echo in 2018 showed grade 1 diastolic dysfunction.  No evidence of volume overload does not take diuretics. - Continue home Coreg  GERD - Continue home PPI  Anxiety and depression - Continue home Valium and Restoril  History of CVA > History of this and seizures a complication of meningitis.  Some residual left-sided deficits.   DVT prophylaxis: Lovenox Code Status:   DNR Family Communication:  None on admission Disposition Plan:   Patient is from:  Home  Anticipated DC to:  Home  Anticipated DC date:  1 to 3 days  Anticipated DC barriers: None  Consults called:  None  Admission status:  Observation, telemetry   Severity of Illness: The appropriate patient status for this patient is OBSERVATION. Observation status is judged to be reasonable and necessary in order to provide the required intensity of service to ensure the patient's safety. The patient's presenting symptoms, physical exam findings, and initial radiographic and laboratory data in the context of their medical condition is felt to place them at decreased risk for further clinical deterioration. Furthermore, it is anticipated that the patient will be medically stable for discharge from the hospital within 2 midnights of admission. The following factors support the patient status of observation.   " The patient's presenting symptoms  include shortness of breath. " The physical exam findings include rhonchi and wheezing. " The initial radiographic and laboratory data are leukocytosis to 13.4, platelets 522, glucose 131, alk phos 130, chest x-ray with airspace disease at the right middle lobe concerning for pneumonia.   Marcelyn Bruins MD [facility]  How to contact the [facility] Attending or Consulting provider [place] or covering provider during after hours [place], for this patient?   1. Check the care team in [facility] and look for a) attending/consulting [facility] provider listed and b) the [facility] team listed 2. Log into [email or web address] and use [place]'s universal password to access. If you do not have the password, please contact the hospital operator. 3. Locate the [facility] provider you are looking for under [facility] and page to a number that you can be directly reached. 4. If you still have difficulty reaching the provider, please page the [facility] (Director on Call) for the Hospitalists listed on amion for assistance.  08/01/2020, 6:55 PM

## 2020-08-01 NOTE — ED Provider Notes (Signed)
[facility] Emergency Department Provider Note  ____________________________________________   None    (approximate)  I have reviewed the triage vital signs and the nursing notes.   HISTORY  Chief Complaint Shortness of Breath    HPI [name] is a 70 y.o. female presents via EMS from home.  Patient is complaining of shortness of breath for 1 to 2 weeks.  States she has a productive cough with green mucus.  Increased weakness when at home. She has a lot of fatigue with fever.  States she is short of breath.  Denies chest pain.  Does state that she has pain under the right rib   Past Medical History:  Diagnosis Date  . Acute blood loss anemia   . Arrhythmia   . Arthritis   . C. difficile colitis   . Cancer ([place])   . Diverticulosis   . Fibromyalgia   . GERD (gastroesophageal reflux disease)   . GI hemorrhage   . H/O: substance abuse ([place]) 2005   narcotic usage due to chronic back pain  . Heart murmur   . Hypertension   . Pancreatic cyst   . Pneumococcal meningitis   . PONV (postoperative nausea and vomiting)   . Renal cyst   . Stroke (cerebrum) [facility])     Patient Active Problem List   Diagnosis Date Noted  . MDD (major depressive disorder), recurrent, severe, with psychosis ([place]) 07/03/2020  . Hallucinations   . Shortness of breath 04/20/2020  . Chronic diastolic CHF (congestive heart failure) ([place]) 04/20/2020  . Nausea & vomiting 04/20/2020  . Prolonged QT interval 04/20/2020  . COPD with acute exacerbation ([place]) 06/20/2017  . DOE (dyspnea on exertion) 06/19/2017  . Cervical dystonia 11/27/2016  . C. difficile colitis 09/10/2016  . Abdominal pain 09/08/2016  . Acute encephalopathy   . Fever   . Left hemiparesis ([place])   . Generalized OA   . Fibromyalgia   . Tobacco abuse   . Substance abuse ([place])   . Benign essential HTN   . Tachycardia   . Chronic pain syndrome   . Acute blood loss anemia   . Leukocytosis   . Septic  thrombophlebitis of sagittal sinus   . Acute respiratory failure ([place])   . Streptococcus pneumoniae meningitis   . Streptococcal bacteremia   . Meningitis   . Cerebral embolism with cerebral infarction 04/12/2016  . Stroke (cerebrum) ([place]) 04/12/2016  . Neck pain 04/04/2016  . Chronic abdominal pain 02/19/2016  . Essential hypertension 02/19/2016  . Bacterial conjunctivitis 02/19/2016  . Health care maintenance 02/19/2016  . Anxiety 05/08/2006  . Cigarette smoker 05/08/2006  . ASTHMA, UNSPECIFIED 05/08/2006  . GERD (gastroesophageal reflux disease) 05/08/2006  . CONSTIPATION 05/08/2006  . CONVULSIONS, SEIZURES, NOS 05/08/2006    Past Surgical History:  Procedure Laterality Date  . ABDOMINAL HYSTERECTOMY    . ABDOMINAL SURGERY    . APPENDECTOMY    . BIOPSY  04/22/2020   Procedure: BIOPSY;  Surgeon: Milus Banister, MD;  Location: [facility];  Service: Gastroenterology;;  . BREAST SURGERY    . CHOLECYSTECTOMY N/A 09/30/2012   Procedure: LAPAROSCOPIC CHOLECYSTECTOMY WITH INTRAOPERATIVE CHOLANGIOGRAM;  Surgeon: Imogene Burn. Georgette Dover, MD;  Location: [facility];  Service: General;  Laterality: N/A;  . ESOPHAGOGASTRODUODENOSCOPY (EGD) WITH PROPOFOL N/A 04/22/2020   Procedure: ESOPHAGOGASTRODUODENOSCOPY (EGD) WITH PROPOFOL;  Surgeon: Milus Banister, MD;  Location: [facility];  Service: Gastroenterology;  Laterality: N/A;  . FRACTURE SURGERY     right upper arm  .  OVARIAN CYST SURGERY      Prior to Admission medications   Medication Sig Start Date End Date Taking? Authorizing Provider  albuterol (VENTOLIN HFA) 108 (90 Base) MCG/ACT inhaler Inhale 2 puffs into the lungs every 6 (six) hours as needed for wheezing or shortness of breath. 04/22/20   Hongalgi, Lenis Dickinson, MD  buprenorphine (SUBUTEX) 2 MG SUBL SL tablet Place 6 mg under the tongue daily. 04/15/20   [provider]  carvedilol (COREG) 6.25 MG tablet Take 6.25 mg by mouth 2 (two) times daily with a meal. 12/25/17   [provider]  diazepam (VALIUM) 2 MG tablet Take 2 mg by mouth 4 (four) times daily as needed for anxiety. 04/02/20   [provider]  esomeprazole (NEXIUM) 40 MG capsule Take 40 mg by mouth daily at 12 noon.    [provider]  mometasone-formoterol (DULERA) 100-5 MCG/ACT AERO Inhale 2 puffs into the lungs 2 (two) times daily. 04/22/20   Hongalgi, Lenis Dickinson, MD  OLANZapine zydis (ZYPREXA) 10 MG disintegrating tablet Take 1 tablet (10 mg total) by mouth at bedtime for 10 days. 07/03/20 07/13/20  Luna Fuse, MD  pantoprazole (PROTONIX) 40 MG tablet Take 1 tablet (40 mg total) by mouth daily. Patient not taking: No sig reported 04/01/17   Pyrtle, Lajuan Lines, MD  polyethylene glycol [facility]) packet Take 17 g by mouth daily. Patient not taking: No sig reported 01/04/18   Isla Pence, MD  scopolamine (TRANSDERM-SCOP) 1 MG/3DAYS Place 1 patch (1.5 mg total) onto the skin every 3 (three) days. Patient not taking: No sig reported 04/24/20   Modena Jansky, MD  simethicone (MYLICON) 657 MG chewable tablet Chew 125 mg by mouth every 6 (six) hours as needed for flatulence.    [provider]  temazepam (RESTORIL) 22.5 MG capsule Take 22.5 mg by mouth at bedtime. 12/02/17   [provider]    Allergies Tylenol [acetaminophen], Codeine, Nsaids, Tolmetin, and Tramadol  Family History  Problem Relation Age of Onset  . Alzheimer's disease Mother   . Heart disease Father   . Prostate cancer Father     Social History Social History   Tobacco Use  . Smoking status: Current Every Day Smoker    Packs/day: 0.75    Years: 49.00    Pack years: 36.75    Types: Cigarettes  . Smokeless tobacco: Never Used  Vaping Use  . Vaping Use: Never used  Substance Use Topics  . Alcohol use: No  . Drug use: No    Comment: Methadone for narcotic use    Review of Systems  Constitutional: Positive fever/chills Eyes: No visual changes. ENT: No sore throat. Respiratory:  Positive shortness of breath and cough Cardiovascular: Denies chest pain Gastrointestinal: Denies abdominal pain Genitourinary: Negative for dysuria. Musculoskeletal: Negative for back pain. Skin: Negative for rash. Psychiatric: no mood changes,     ____________________________________________   PHYSICAL EXAM:  VITAL SIGNS: ED Triage Vitals  Enc Vitals Group     BP 08/01/20 1404 (!) 128/112     Pulse Rate 08/01/20 1404 88     Resp 08/01/20 1404 20     Temp 08/01/20 1404 98.1 F (36.7 C)     Temp Source 08/01/20 1404 Oral     SpO2 08/01/20 1404 94 %     Weight --      Height --      Head Circumference --      Peak Flow --  Pain Score 08/01/20 1406 5     Pain Loc --      Pain Edu? --      Excl. in [place]? --     Constitutional: Alert and oriented. Well appearing and in no acute distress. Eyes: Conjunctivae are normal.  Head: Atraumatic. Nose: No congestion/rhinnorhea. Mouth/Throat: Mucous membranes are moist.   Neck:  supple no lymphadenopathy noted Cardiovascular: Normal rate, regular rhythm. Heart sounds are normal Respiratory: Normal respiratory effort.  No retractions, lungs c t a, cough is congested Abd: soft nontender bs normal all 4 quad GU: deferred Musculoskeletal: FROM all extremities, warm and well perfused Neurologic:  Normal speech and language.  Skin:  Skin is warm, dry and intact. No rash noted. Psychiatric: Mood and affect are normal. Speech and behavior are normal.  ____________________________________________   LABS (all labs ordered are listed, but only abnormal results are displayed)  Labs Reviewed  CBC - Abnormal; Notable for the following components:      Result Value   WBC 30.4 (*)    Platelets 522 (*)    All other components within normal limits  BASIC METABOLIC PANEL - Abnormal; Notable for the following components:   Glucose, Bld 131 (*)    All other components within normal limits  HEPATIC FUNCTION PANEL - Abnormal; Notable for  the following components:   Alkaline Phosphatase 130 (*)    All other components within normal limits  URINALYSIS, COMPLETE (UACMP) WITH MICROSCOPIC - Abnormal; Notable for the following components:   Color, Urine YELLOW (*)    APPearance HAZY (*)    Hgb urine dipstick SMALL (*)    Ketones, ur 5 (*)    Protein, ur 30 (*)    Bacteria, UA RARE (*)    All other components within normal limits  BLOOD GAS, VENOUS - Abnormal; Notable for the following components:   pO2, Ven 54.0 (*)    Bicarbonate 31.6 (*)    Acid-Base Excess 5.5 (*)    All other components within normal limits  RESP PANEL BY RT-PCR (FLU A&B, COVID) ARPGX2  CULTURE, BLOOD (SINGLE)  URINE CULTURE  LACTIC ACID, PLASMA  PROTIME-INR  APTT  LACTIC ACID, PLASMA  TROPONIN I (HIGH SENSITIVITY)   ____________________________________________   ____________________________________________  RADIOLOGY  Chest x-ray  ____________________________________________   PROCEDURES  Procedure(s) performed: EKG shows normal sinus rhythm, see physician read  .Critical Care E&M Performed by: Versie Starks, PA-C  Critical care provider statement:    Critical care time (minutes):  45   Critical care time was exclusive of:  Separately billable procedures and treating other patients   Critical care was necessary to treat or prevent imminent or life-threatening deterioration of the following conditions:  Respiratory failure   Critical care was time spent personally by me on the following activities:  Blood draw for specimens, development of treatment plan with patient or surrogate, evaluation of patient's response to treatment, examination of patient, obtaining history from patient or surrogate, ordering and performing treatments and interventions, ordering and review of laboratory studies, ordering and review of radiographic studies, pulse oximetry, re-evaluation of patient's condition and review of old charts   Care discussed with:  admitting provider   After initial E/M assessment, critical care services were subsequently performed that were exclusive of separately billable procedures or treatment.        ____________________________________________   INITIAL IMPRESSION / [place] / ED COURSE  Pertinent labs & imaging results that were available during my care of  the patient were reviewed by me and considered in my medical decision making (see chart for details).   The patient is a 70 year old female presents with fever, cough, shortness of breath.  See HPI.  Physical exam shows patient be hypoxic on arrival.  EMS states her O2 saturation on room air was 90%.  She is now at 94% on 2 L nasal cannula, patient continues to have cough and fatigue.  DDx: Sepsis, CAP, COVID, PE, MI  CBC has elevated WBC of 54.4, basic metabolic panel has glucose of 131, hepatic panel is normal, PT PTT is normal, troponin is normal, lactic acid is normal, venous blood gas shows PO2 54, bicarb 31.6, acid-based 5.5 which are all elevated UA shows ketones and protein no leuks or nitrates.  Chest x-ray reviewed by me confirmed by radiology to have pneumonia.  Do feel the patient is hypoxic secondary to pneumonia.  Continue O2 on 2 L nasal cannula.  Discussed admission with hospitalist.  Due to the new need for O2 and pneumonia along with elevated WBC of 30 feel patient will be admitted.  He agrees for admission.  Patient is stable at time of admission   [name] was evaluated in Emergency Department on 08/01/2020 for the symptoms described in the history of present illness. She was evaluated in the context of the global COVID-19 pandemic, which necessitated consideration that the patient might be at risk for infection with the SARS-CoV-2 virus that causes COVID-19. Institutional protocols and algorithms that pertain to the evaluation of patients at risk for COVID-19 are in a state of rapid change based on information  released by regulatory bodies including the CDC and federal and state organizations. These policies and algorithms were followed during the patient's care in the ED.    As part of my medical decision making, I reviewed the following data within the [place] notes reviewed and incorporated, Labs reviewed , EKG interpreted, Old chart reviewed, Radiograph reviewed , Discussed with admitting physician , Notes from prior ED visits and [place] Controlled Substance Database  ____________________________________________   FINAL CLINICAL IMPRESSION(S) / ED DIAGNOSES  Final diagnoses:  Acute respiratory distress  Hypoxia      NEW MEDICATIONS STARTED DURING THIS VISIT:  New Prescriptions   No medications on file     Note:  This document was prepared using Dragon voice recognition software and may include unintentional dictation errors.    Versie Starks, PA-C 08/01/20 1841    Naaman Plummer, MD 08/01/20 2040

## 2020-08-01 NOTE — Progress Notes (Signed)
Patient has continued to experience wheezing despite neb treatment per her nurse.  Patient remains wheezy and rhonchorous.  States she still has some shortness of breath despite saturating well.  Patient also requesting home medications that had not been restarted. > Increased concern for COPD exacerbation - Dose Solu-Medrol 125 mg now and start prednisone daily - Add on as needed DuoNebs - Restart home meds (Restoril and Subutex)

## 2020-08-02 ENCOUNTER — Encounter: Payer: Self-pay | Admitting: Internal Medicine

## 2020-08-02 DIAGNOSIS — R9431 Abnormal electrocardiogram [ECG] [EKG]: Secondary | ICD-10-CM | POA: Diagnosis present

## 2020-08-02 DIAGNOSIS — I5032 Chronic diastolic (congestive) heart failure: Secondary | ICD-10-CM | POA: Diagnosis present

## 2020-08-02 DIAGNOSIS — J9601 Acute respiratory failure with hypoxia: Secondary | ICD-10-CM | POA: Diagnosis present

## 2020-08-02 DIAGNOSIS — Z886 Allergy status to analgesic agent status: Secondary | ICD-10-CM | POA: Diagnosis not present

## 2020-08-02 DIAGNOSIS — F17210 Nicotine dependence, cigarettes, uncomplicated: Secondary | ICD-10-CM | POA: Diagnosis present

## 2020-08-02 DIAGNOSIS — Z79899 Other long term (current) drug therapy: Secondary | ICD-10-CM | POA: Diagnosis not present

## 2020-08-02 DIAGNOSIS — R0603 Acute respiratory distress: Secondary | ICD-10-CM | POA: Diagnosis present

## 2020-08-02 DIAGNOSIS — F419 Anxiety disorder, unspecified: Secondary | ICD-10-CM | POA: Diagnosis present

## 2020-08-02 DIAGNOSIS — Z885 Allergy status to narcotic agent status: Secondary | ICD-10-CM | POA: Diagnosis not present

## 2020-08-02 DIAGNOSIS — Z681 Body mass index (BMI) 19 or less, adult: Secondary | ICD-10-CM | POA: Diagnosis not present

## 2020-08-02 DIAGNOSIS — K219 Gastro-esophageal reflux disease without esophagitis: Secondary | ICD-10-CM | POA: Diagnosis present

## 2020-08-02 DIAGNOSIS — Z8661 Personal history of infections of the central nervous system: Secondary | ICD-10-CM | POA: Diagnosis not present

## 2020-08-02 DIAGNOSIS — G894 Chronic pain syndrome: Secondary | ICD-10-CM | POA: Diagnosis present

## 2020-08-02 DIAGNOSIS — J449 Chronic obstructive pulmonary disease, unspecified: Secondary | ICD-10-CM | POA: Diagnosis present

## 2020-08-02 DIAGNOSIS — Z820 Family history of epilepsy and other diseases of the nervous system: Secondary | ICD-10-CM | POA: Diagnosis not present

## 2020-08-02 DIAGNOSIS — J441 Chronic obstructive pulmonary disease with (acute) exacerbation: Secondary | ICD-10-CM | POA: Diagnosis present

## 2020-08-02 DIAGNOSIS — Z8249 Family history of ischemic heart disease and other diseases of the circulatory system: Secondary | ICD-10-CM | POA: Diagnosis not present

## 2020-08-02 DIAGNOSIS — Z8673 Personal history of transient ischemic attack (TIA), and cerebral infarction without residual deficits: Secondary | ICD-10-CM | POA: Diagnosis not present

## 2020-08-02 DIAGNOSIS — I110 Hypertensive heart disease with heart failure: Secondary | ICD-10-CM | POA: Diagnosis present

## 2020-08-02 DIAGNOSIS — Z20822 Contact with and (suspected) exposure to covid-19: Secondary | ICD-10-CM | POA: Diagnosis present

## 2020-08-02 DIAGNOSIS — E43 Unspecified severe protein-calorie malnutrition: Secondary | ICD-10-CM | POA: Diagnosis present

## 2020-08-02 DIAGNOSIS — F333 Major depressive disorder, recurrent, severe with psychotic symptoms: Secondary | ICD-10-CM | POA: Diagnosis present

## 2020-08-02 DIAGNOSIS — I10 Essential (primary) hypertension: Secondary | ICD-10-CM | POA: Diagnosis not present

## 2020-08-02 DIAGNOSIS — J189 Pneumonia, unspecified organism: Secondary | ICD-10-CM | POA: Diagnosis present

## 2020-08-02 DIAGNOSIS — J440 Chronic obstructive pulmonary disease with acute lower respiratory infection: Secondary | ICD-10-CM | POA: Diagnosis present

## 2020-08-02 DIAGNOSIS — Z66 Do not resuscitate: Secondary | ICD-10-CM | POA: Diagnosis present

## 2020-08-02 DIAGNOSIS — M797 Fibromyalgia: Secondary | ICD-10-CM | POA: Diagnosis present

## 2020-08-02 LAB — CBC
HCT: 41.8 % (ref 36.0–46.0)
Hemoglobin: 13.7 g/dL (ref 12.0–15.0)
MCH: 29.1 pg (ref 26.0–34.0)
MCHC: 32.8 g/dL (ref 30.0–36.0)
MCV: 88.9 fL (ref 80.0–100.0)
Platelets: 516 10*3/uL — ABNORMAL HIGH (ref 150–400)
RBC: 4.7 MIL/uL (ref 3.87–5.11)
RDW: 13.2 % (ref 11.5–15.5)
WBC: 30.8 10*3/uL — ABNORMAL HIGH (ref 4.0–10.5)
nRBC: 0 % (ref 0.0–0.2)

## 2020-08-02 LAB — COMPREHENSIVE METABOLIC PANEL
ALT: 31 U/L (ref 0–44)
AST: 22 U/L (ref 15–41)
Albumin: 3.6 g/dL (ref 3.5–5.0)
Alkaline Phosphatase: 129 U/L — ABNORMAL HIGH (ref 38–126)
Anion gap: 11 (ref 5–15)
BUN: 17 mg/dL (ref 8–23)
CO2: 27 mmol/L (ref 22–32)
Calcium: 9.2 mg/dL (ref 8.9–10.3)
Chloride: 104 mmol/L (ref 98–111)
Creatinine, Ser: 0.5 mg/dL (ref 0.44–1.00)
GFR, Estimated: >60 mL/min (ref >60)
Glucose, Bld: 139 mg/dL — ABNORMAL HIGH (ref 70–99)
Potassium: 3.8 mmol/L (ref 3.5–5.1)
Sodium: 142 mmol/L (ref 135–145)
Total Bilirubin: 0.8 mg/dL (ref 0.3–1.2)
Total Protein: 7.5 g/dL (ref 6.5–8.1)

## 2020-08-02 MED ORDER — CARVEDILOL 3.125 MG PO TABS
3.1250 mg | ORAL_TABLET | Freq: Two times a day (BID) | ORAL | Status: DC
  Administered 2020-08-03 – 2020-08-06 (×7): 3.125 mg via ORAL
  Filled 2020-08-02 (×7): qty 1

## 2020-08-02 MED ORDER — ADULT MULTIVITAMIN W/MINERALS CH
1.0000 | ORAL_TABLET | Freq: Every day | ORAL | Status: DC
  Administered 2020-08-02 – 2020-08-06 (×5): 1 via ORAL
  Filled 2020-08-02 (×5): qty 1

## 2020-08-02 MED ORDER — METHYLPREDNISOLONE SODIUM SUCC 40 MG IJ SOLR
40.0000 mg | Freq: Four times a day (QID) | INTRAMUSCULAR | Status: DC
  Administered 2020-08-02 – 2020-08-05 (×12): 40 mg via INTRAVENOUS
  Filled 2020-08-02 (×12): qty 1

## 2020-08-02 MED ORDER — ONDANSETRON HCL 4 MG/2ML IJ SOLN
4.0000 mg | Freq: Once | INTRAMUSCULAR | Status: AC
  Administered 2020-08-02: 4 mg via INTRAVENOUS
  Filled 2020-08-02: qty 2

## 2020-08-02 MED ORDER — ENSURE ENLIVE PO LIQD
237.0000 mL | Freq: Three times a day (TID) | ORAL | Status: DC
  Administered 2020-08-02 – 2020-08-06 (×10): 237 mL via ORAL

## 2020-08-02 MED ORDER — IPRATROPIUM-ALBUTEROL 0.5-2.5 (3) MG/3ML IN SOLN
3.0000 mL | Freq: Four times a day (QID) | RESPIRATORY_TRACT | Status: DC
  Administered 2020-08-02 – 2020-08-06 (×16): 3 mL via RESPIRATORY_TRACT
  Filled 2020-08-02 (×16): qty 3

## 2020-08-02 NOTE — Plan of Care (Signed)
  Problem: Education: Goal: Knowledge of General Education information will improve Description Including pain rating scale, medication(s)/side effects and non-pharmacologic comfort measures Outcome: Progressing   

## 2020-08-02 NOTE — Progress Notes (Signed)
Initial Nutrition Assessment  DOCUMENTATION CODES:  Severe malnutrition in context of chronic illness  INTERVENTION:   Adjust diet to regular to maximize menu options, encourage PO intake  MVI with minerals daily  Ensure Enlive po TID, each supplement provides 350 kcal and 20 grams of protein   NUTRITION DIAGNOSIS:  Severe Malnutrition related to chronic illness (COPD, CHF, substance abuse) as evidenced by severe fat depletion,severe muscle depletion.  GOAL:  Patient will meet greater than or equal to 90% of their needs  MONITOR:  PO intake,Supplement acceptance,Labs,Weight trends  REASON FOR ASSESSMENT:  Other (Comment) (low BMI)    ASSESSMENT:  Pt presented to ED from home with worsening shortness of breath for 1-2 weeks. Endorses productive cough with green mucus and weakness. Workup consistent with COPD exacerbation. PMH relevant for diverticulosis, GERD, substance abuse (on methadone), HTN, Hx stroke, COPD, CHF   Pt resting in bedside chair at the time of visit. States she is feeling a little better today. States she hasn't had much of an appetite the last few weeks due to her SOB. Thinks it is improving since admission. Pt reports that she lives with her son and his wife and they assist with her care and do the cooking at home. Pt reports weight loss the last few weeks from her usual of about 105 lbs. Will request new measured weight to determine if some loss was due to dehydration. Pt agreeable to a nutrition supplement. No hx of DM noted, will recommend liberalizing diet.    Average Meal Intake: . 5/24-5/25: 70% intake x 2 recorded meals (40-100%)  Nutritionally Relevant Medications  Scheduled Meds: . methylPREDNISolone injection  40 mg Intravenous Q6H  . pantoprazole  40 mg Oral Daily   PRN Meds: polyethylene glycol, simethicone  Nutritionally Relevant Labs Reviewed  NUTRITION - FOCUSED PHYSICAL EXAM: Flowsheet Row Most Recent Value  Orbital Region Severe  depletion  Upper Arm Region Severe depletion  Thoracic and Lumbar Region Severe depletion  Buccal Region Severe depletion  Temple Region Severe depletion  Clavicle Bone Region Severe depletion  Clavicle and Acromion Bone Region Severe depletion  Scapular Bone Region Severe depletion  Dorsal Hand Moderate depletion  Patellar Region Severe depletion  Anterior Thigh Region Severe depletion  Posterior Calf Region Severe depletion  Edema (RD Assessment) None  Hair Reviewed  Eyes Reviewed  Mouth Reviewed  Skin Reviewed  Nails Reviewed     Diet Order:   Diet Order            Diet regular Room service appropriate? Yes; Fluid consistency: Thin  Diet effective now                EDUCATION NEEDS:  No education needs have been identified at this time  Skin:  Skin Assessment: Reviewed RN Assessment  Last BM:  5/23 per RN documentation  Height:  Ht Readings from Last 1 Encounters:  08/01/20 5\' 4"  (1.626 m)    Weight:  Wt Readings from Last 1 Encounters:  08/02/20 37 kg    Ideal Body Weight:  54.5 kg  BMI:  Body mass index is 14 kg/m.  Estimated Nutritional Needs:   Kcal:  1400-1600 kcal/d  Protein:  70-85 g/d  Fluid:  >1500 mL/d   Ranell Patrick, RD, LDN Clinical Dietitian Pager on [place]

## 2020-08-02 NOTE — TOC Initial Note (Signed)
Transition of Care [facility]) - Initial/Assessment Note    Patient Details  Name: [name] MRN: [id number] Date of Birth: 11/04/1950  [facility] CM/SW Contact:    Shelbie Hutching, RN Phone Number: 08/02/2020, 12:25 PM  Clinical Narrative:                 Patient placed under observation for COPD exacerbation, acute respiratory failure on acute O2 at 3L.  RNCM met with patient at the bedside.  Patient sitting up in chair.  Patient reports that she lives with her son, daughter in law and 2 grandchildren.  She has been living with her son for about 4 months so he can help take care of her.  Patient reports that she is independent with ADL's and does a lot of walking at home.  She does not drive but son takes her to all of her appointments.  Patient is current with PCP in [place].  Patient agrees to home health or SNF if either recommended by PT.   TOC will cont to follow.   Expected Discharge Plan: [place] Barriers to Discharge: Continued Medical Work up   Patient Goals and CMS Choice Patient states their goals for this hospitalization and ongoing recovery are:: Patient wants to breath better- agrees to [facility] or SNF if recommended CMS Medicare.gov Compare Post Acute Care list provided to:: Patient Choice offered to / list presented to : Patient  Expected Discharge Plan and Services Expected Discharge Plan: [place]   Discharge Planning Services: CM Consult Post Acute Care Choice: [place] arrangements for the past 2 months: Single Family Home                 DME Arranged: N/A DME Agency: NA                  Prior Living Arrangements/Services Living arrangements for the past 2 months: Single Family Home Lives with:: Adult Children,Relatives Patient language and need for interpreter reviewed:: Yes Do you feel safe going back to the place where you live?: Yes      Need for Family Participation in Patient Care: Yes  (Comment) (COPD) Care giver support system in place?: Yes (comment) (son)   Criminal Activity/Legal Involvement Pertinent to Current Situation/Hospitalization: No - Comment as needed  Activities of Daily Living Home Assistive Devices/Equipment: None ADL Screening (condition at time of admission) Patient's cognitive ability adequate to safely complete daily activities?: Yes Is the patient deaf or have difficulty hearing?: No Does the patient have difficulty seeing, even when wearing glasses/contacts?: No Does the patient have difficulty concentrating, remembering, or making decisions?: No Patient able to express need for assistance with ADLs?: Yes Does the patient have difficulty dressing or bathing?: No Independently performs ADLs?: Yes (appropriate for developmental age) Does the patient have difficulty walking or climbing stairs?: No Weakness of Legs: Both Weakness of Arms/Hands: None  Permission Sought/Granted Permission sought to share information with : Case Manager,Family Supports,Other (comment) Permission granted to share information with : Yes, Verbal Permission Granted  Share Information with NAME: Elberta Fortis  Permission granted to share info w AGENCY: home health and or SNF  Permission granted to share info w Relationship: son     Emotional Assessment Appearance:: Appears older than stated age Attitude/Demeanor/Rapport: Engaged Affect (typically observed): Accepting Orientation: : Oriented to Self,Oriented to Place,Oriented to  Time,Oriented to Situation Alcohol / Substance Use: Not Applicable Psych Involvement: No (comment)  Admission diagnosis:  Acute respiratory distress [R06.03] Hypoxia [R09.02] Acute respiratory failure with hypoxia ([place]) [J96.01] Patient Active Problem List   Diagnosis Date Noted  . Acute respiratory failure with hypoxia ([place]) 08/01/2020  . MDD (major depressive disorder), recurrent, severe, with psychosis ([place]) 07/03/2020  . Hallucinations    . Shortness of breath 04/20/2020  . Chronic diastolic CHF (congestive heart failure) ([place]) 04/20/2020  . Nausea & vomiting 04/20/2020  . Prolonged QT interval 04/20/2020  . COPD with acute exacerbation ([place]) 06/20/2017  . DOE (dyspnea on exertion) 06/19/2017  . Cervical dystonia 11/27/2016  . C. difficile colitis 09/10/2016  . Abdominal pain 09/08/2016  . Acute encephalopathy   . Fever   . Left hemiparesis ([place])   . Generalized OA   . Fibromyalgia   . Tobacco abuse   . Substance abuse ([place])   . Benign essential HTN   . Tachycardia   . Chronic pain syndrome   . Acute blood loss anemia   . Leukocytosis   . Septic thrombophlebitis of sagittal sinus   . Acute respiratory failure ([place])   . Streptococcus pneumoniae meningitis   . Streptococcal bacteremia   . Meningitis   . History of stroke 04/12/2016  . Stroke (cerebrum) ([place]) 04/12/2016  . Neck pain 04/04/2016  . Chronic abdominal pain 02/19/2016  . Essential hypertension 02/19/2016  . Bacterial conjunctivitis 02/19/2016  . Health care maintenance 02/19/2016  . Anxiety 05/08/2006  . Cigarette smoker 05/08/2006  . ASTHMA, UNSPECIFIED 05/08/2006  . GERD (gastroesophageal reflux disease) 05/08/2006  . CONSTIPATION 05/08/2006  . CONVULSIONS, SEIZURES, NOS 05/08/2006   PCP:  [name], MD Pharmacy:   [id number] [place]  [address] Phone: [phone] Fax: [phone]  [id number] [place], [place] [address] [address] Phone: [phone] Fax: [phone]     Social Determinants of Health (SDOH) Interventions    Readmission Risk Interventions No flowsheet data found.

## 2020-08-02 NOTE — Progress Notes (Signed)
[facility] PROGRESS NOTE   [name] EZM:[id number] DOB: Sep 26, 1950 DOA: 08/01/2020  PCP: [name], MD  Brief History/Interval Summary: 70 y.o. female with medical history significant of anxiety, depression, asthma, COPD, hypertension, C. difficile, CVA, cervical dystonia, chronic pain, fibromyalgia, seizures, GERD, prolonged QT, substance use who presented with ongoing shortness of breath.  Patient reported cough with greenish expectoration.  Chest x-ray showed right middle lobe pneumonia.  Patient was also wheezing while admitted.  She was hospitalized for further management.  Reason for Visit: COPD with acute exacerbation.  Community-acquired pneumonia  Consultants: None  Procedures: None  Antibiotics: Anti-infectives (From admission, onward)   Start     Dose/Rate Route Frequency Ordered Stop   08/01/20 2000  azithromycin (ZITHROMAX) 500 mg in sodium chloride 0.9 % 250 mL IVPB        500 mg 250 mL/hr over 60 Minutes Intravenous Every 24 hours 08/01/20 1840 08/06/20 1959   08/01/20 1900  cefTRIAXone (ROCEPHIN) 2 g in sodium chloride 0.9 % 100 mL IVPB        2 g 200 mL/hr over 30 Minutes Intravenous Every 24 hours 08/01/20 1840 08/06/20 1859      Subjective/Interval History: Patient complains of a headache this morning.  Shortness of breath persist.  Has had a dry cough.  Denies any chest pain.  No nausea or vomiting.    Assessment/Plan:  COPD with acute exacerbation/acute respiratory failure with hypoxia/community-acquired pneumonia WBC noted to be significantly elevated.   She has new oxygen requirements.  Does not use oxygen at home.   Continue ceftriaxone and azithromycin.  Patient also remains on systemic steroids.  We will also give her inhaled steroids.  Continue nebulizer treatments.  Headache No neurological deficits.  Likely due to acute illness.  Analgesic agents.  History of chronic pain syndrome/fibromyalgia/history of substance  use Patient is on Subutex daily.  This is being continued.  Essential hypertension/chronic diastolic CHF/Prolonged QT interval Blood pressure reasonably well controlled.  No clear evidence for CHF exacerbation at this time.  Not noted to be on any diuretics.  Continue carvedilol.  Avoid QT prolonging medications.  GERD Continue PPI  History of anxiety and depression Continue home medications.  History of stroke This was likely a complication of meningitis.  She also had seizure at that time.  Has some residual left-sided weakness.  PT and OT evaluation.   DVT Prophylaxis: Lovenox Code Status: DNR Family Communication: No family at bedside Disposition Plan: Hopefully return home when improved  Status is: Observation  The patient will require care spanning > 2 midnights and should be moved to inpatient because: IV treatments appropriate due to intensity of illness or inability to take PO and Inpatient level of care appropriate due to severity of illness  Dispo: The patient is from: Home              Anticipated d/c is to: Home              Patient currently is not medically stable to d/c.   Difficult to place patient No       Medications:  Scheduled: . buprenorphine  2 mg Sublingual Daily  . carvedilol  6.25 mg Oral BID WC  . enoxaparin (LOVENOX) injection  30 mg Subcutaneous Q24H  . ipratropium-albuterol  3 mL Nebulization Once  . mometasone-formoterol  2 puff Inhalation BID  . pantoprazole  40 mg Oral Daily  . predniSONE  40 mg Oral Q breakfast  . sodium  chloride flush  3 mL Intravenous Q12H  . temazepam  22.5 mg Oral QHS   Continuous: . azithromycin Stopped (08/01/20 2051)  . cefTRIAXone (ROCEPHIN)  IV Stopped (08/01/20 1942)   OIN:OMVEHMCNOBSJG **OR** acetaminophen, albuterol, diazepam, guaiFENesin, ipratropium-albuterol, polyethylene glycol, simethicone   Objective:  Vital Signs  Vitals:   08/02/20 0033 08/02/20 0446 08/02/20 0500 08/02/20 0738  BP: (!)  123/56 (!) 109/56  127/69  Pulse: 91 81  80  Resp: 19 18  16   Temp: 98.6 F (37 C) 97.8 F (36.6 C)  (!) 96.9 F (36.1 C)  TempSrc:    Axillary  SpO2: 96% 99%  90%  Weight:   37 kg   Height:        Intake/Output Summary (Last 24 hours) at 08/02/2020 0923 Last data filed at 08/01/2020 1942 Gross per 24 hour  Intake 550 ml  Output --  Net 550 ml   Filed Weights   08/01/20 2059 08/02/20 0500  Weight: 37 kg 37 kg    General appearance: Awake alert.  In no distress Resp: Mildly tachypneic.  Wheezing heard bilaterally diffusely.  Few crackles on the right. Cardio: S1-S2 is normal regular.  No S3-S4.  No rubs murmurs or bruit GI: Abdomen is soft.  Nontender nondistended.  Bowel sounds are present normal.  No masses organomegaly Extremities: No edema.  Full range of motion of lower extremities. Neurologic: Alert and oriented x3.  No focal neurological deficits.    Lab Results:  Data Reviewed: I have personally reviewed following labs and imaging studies  CBC: Recent Labs  Lab 08/01/20 1404 08/02/20 0437  WBC 30.4* 30.8*  HGB 13.3 13.7  HCT 39.3 41.8  MCV 86.4 88.9  PLT 522* 516*    Basic Metabolic Panel: Recent Labs  Lab 08/01/20 1404 08/02/20 0437  NA 138 142  K 3.6 3.8  CL 99 104  CO2 29 27  GLUCOSE 131* 139*  BUN 20 17  CREATININE 0.61 0.50  CALCIUM 9.4 9.2    GFR: Estimated Creatinine Clearance: 39.3 mL/min (by C-G formula based on SCr of 0.5 mg/dL).  Liver Function Tests: Recent Labs  Lab 08/01/20 1739 08/02/20 0437  AST 26 22  ALT 35 31  ALKPHOS 130* 129*  BILITOT 0.8 0.8  PROT 7.4 7.5  ALBUMIN 3.7 3.6    Coagulation Profile: Recent Labs  Lab 08/01/20 1744  INR 1.1      Recent Results (from the past 240 hour(s))  Resp Panel by RT-PCR (Flu A&B, Covid) Nasopharyngeal Swab     Status: None   Collection Time: 08/01/20  5:39 PM   Specimen: Nasopharyngeal Swab; Nasopharyngeal(NP) swabs in vial transport medium  Result Value Ref Range  Status   SARS Coronavirus 2 by RT PCR NEGATIVE NEGATIVE Final    Comment: (NOTE) SARS-CoV-2 target nucleic acids are NOT DETECTED.  The SARS-CoV-2 RNA is generally detectable in upper respiratory specimens during the acute phase of infection. The lowest concentration of SARS-CoV-2 viral copies this assay can detect is 138 copies/mL. A negative result does not preclude SARS-Cov-2 infection and should not be used as the sole basis for treatment or other patient management decisions. A negative result may occur with  improper specimen collection/handling, submission of specimen other than nasopharyngeal swab, presence of viral mutation(s) within the areas targeted by this assay, and inadequate number of viral copies(<138 copies/mL). A negative result must be combined with clinical observations, patient history, and epidemiological information. The expected result is Negative.  Fact Sheet for  Patients:  EntrepreneurPulse.com.au  Fact Sheet for Healthcare Providers:  IncredibleEmployment.be  This test is no t yet approved or cleared by the [place] FDA and  has been authorized for detection and/or diagnosis of SARS-CoV-2 by FDA under an Emergency Use Authorization (EUA). This EUA will remain  in effect (meaning this test can be used) for the duration of the COVID-19 declaration under Section 564(b)(1) of the Act, 21 U.S.C.section 360bbb-3(b)(1), unless the authorization is terminated  or revoked sooner.       Influenza A by PCR NEGATIVE NEGATIVE Final   Influenza B by PCR NEGATIVE NEGATIVE Final    Comment: (NOTE) The Xpert Xpress SARS-CoV-2/FLU/RSV plus assay is intended as an aid in the diagnosis of influenza from Nasopharyngeal swab specimens and should not be used as a sole basis for treatment. Nasal washings and aspirates are unacceptable for Xpert Xpress SARS-CoV-2/FLU/RSV testing.  Fact Sheet for  Patients: EntrepreneurPulse.com.au  Fact Sheet for Healthcare Providers: IncredibleEmployment.be  This test is not yet approved or cleared by the [place] FDA and has been authorized for detection and/or diagnosis of SARS-CoV-2 by FDA under an Emergency Use Authorization (EUA). This EUA will remain in effect (meaning this test can be used) for the duration of the COVID-19 declaration under Section 564(b)(1) of the Act, 21 U.S.C. section 360bbb-3(b)(1), unless the authorization is terminated or revoked.  Performed at [facility], [address] [place], [place] [address]   Blood culture (single)     Status: None (Preliminary result)   Collection Time: 08/01/20  5:39 PM   Specimen: BLOOD  Result Value Ref Range Status   Specimen Description BLOOD LEFT ANTECUBITAL  Final   Special Requests   Final    BOTTLES DRAWN AEROBIC AND ANAEROBIC Blood Culture results may not be optimal due to an inadequate volume of blood received in culture bottles   Culture   Final    NO GROWTH < 24 HOURS Performed at [facility], [address] [place], [place] [address]    Report Status PENDING  Incomplete      Radiology Studies: DG Chest 2 View  Result Date: 08/01/2020 CLINICAL DATA:  Productive cough and increasing shortness of breath over the past week. EXAM: CHEST - 2 VIEW COMPARISON:  PA and lateral chest 01/04/2018 and 05/06/2020. FINDINGS: There is airspace disease in the right middle lobe. Lungs are otherwise clear. Emphysema noted. Aortic atherosclerosis is seen. Heart size is normal. No pneumothorax or fluid. No acute focal bony abnormality. IMPRESSION: Airspace disease in the right middle lobe worrisome for pneumonia. Aortic Atherosclerosis (ICD10-I70.0) and Emphysema (ICD10-J43.9). Electronically Signed   By: Inge Rise M.D.   On: 08/01/2020 14:46       LOS: 0 days   [facility] Pager on  [email or web address]  08/02/2020, 9:23 AM

## 2020-08-02 NOTE — Care Management Obs Status (Signed)
[facility] NOTIFICATION   Patient Details  Name: [name] MRN: [id number] Date of Birth: [birth year]   Medicare Observation Status Notification Given:  Yes    Shelbie Hutching, RN 08/02/2020, 12:18 PM

## 2020-08-02 NOTE — Evaluation (Signed)
Physical Therapy Evaluation Patient Details Name: [name] MRN: [id number] DOB: 1950/08/03 Today's Date: 08/02/2020   History of Present Illness  presented to ER secondary to progressive SOB; admitted for management of acute respiratory failure with hypoxia, COPD exacerbation.  Clinical Impression  Patient resting in supine upon arrival to room; alert and oriented, follows commands and agreeable to session. Noted with O2 removed, resting on RA. Sats measured at 85%, requiring application of 3L supplemental O2 for recovery >90%.  Patient generally frail and cachetic; endorses generalized weakness (with history of CVA, very mild L residual), but denies pain at this time.  Able to complete bed mobility with mod indep; sit/stand, basic transfers and gait (400') without assist device, cga.  Demonstrates maintains UEs in guarded posture across abdomen, limited trunk sway, arm swing as result; very short, tenuous steps with mild balance deficits/sway noted during dynamic gait components (head turns). Fair ability to modulate gait speed, but unable to maintain increased speed due to endurance deficits.  Requiring 3L supplemental O2 via [place] to maintain O2 >90% with gait efforts Would benefit from skilled PT to address above deficits and promote optimal return to PLOF.; Recommend transition to HHPT upon discharge from acute hospitalization.  SaO2 on room air at rest = 85% SaO2 on 3L at rest = 93% SaO2 on 3L liters of O2 while ambulating = 92%     Follow Up Recommendations Home health PT    Equipment Recommendations       Recommendations for Other Services       Precautions / Restrictions Precautions Precautions: Fall Restrictions Weight Bearing Restrictions: No      Mobility  Bed Mobility Overal bed mobility: Modified Independent                  Transfers Overall transfer level: Needs assistance Equipment used: None Transfers: Sit to/from Stand Sit to Stand: Min guard             Ambulation/Gait Ambulation/Gait assistance: Min guard Gait Distance (Feet): 400 Feet     Gait velocity: 10' walk time, 10 seconds   General Gait Details: maintains UEs in guarded posture across abdomen, limited trunk sway, arm swing as result; very short, tenuous steps with mild balance deficits/sway noted during dynamic gait components (head turns). Fair ability to modulate gait speed, but unable to maintain increased speed due to endurance deficits.  Requiring 3L supplemental O2 via [place] to maintain O2 >90% with gait efforts  Stairs            Wheelchair Mobility    Modified Rankin (Stroke Patients Only)       Balance Overall balance assessment: Needs assistance Sitting-balance support: No upper extremity supported;Feet supported Sitting balance-Leahy Scale: Good     Standing balance support: No upper extremity supported Standing balance-Leahy Scale: Fair                               Pertinent Vitals/Pain Pain Assessment: No/denies pain    Home Living Family/patient expects to be discharged to:: Private residence Living Arrangements: Spouse/significant other Available Help at Discharge: Family;Available 24 hours/day Type of Home: House Home Access: Stairs to enter Entrance Stairs-Rails: Right Entrance Stairs-Number of Steps: 6 Home Layout: One level   Additional Comments: lives with son and daughter-in-law; someone home and available 24/7    Prior Function Level of Independence: Independent         Comments: Indep with ADLs, household  and community-distance mobility; enjoys taking dogs (2 chihuahuas) for walks.  No home O2.  No fall history.     Hand Dominance        Extremity/Trunk Assessment   Upper Extremity Assessment Upper Extremity Assessment: Generalized weakness    Lower Extremity Assessment Lower Extremity Assessment: Generalized weakness (grossly 4/5 throughout)       Communication   Communication: No  difficulties  Cognition Arousal/Alertness: Awake/alert Behavior During Therapy: WFL for tasks assessed/performed Overall Cognitive Status: Within Functional Limits for tasks assessed                                        General Comments      Exercises Other Exercises Other Exercises: Reviewed role of activity pacing, energy conservation, pursed lip breathing; patient voiced understanding Other Exercises: Toilet transfer, ambulatory without assist device, cga; sit/stand from standard toilet with grab bar, cga/close sup.   Assessment/Plan    PT Assessment Patient needs continued PT services  PT Problem List Decreased activity tolerance;Decreased balance;Decreased mobility;Cardiopulmonary status limiting activity       PT Treatment Interventions DME instruction;Balance training;Gait training;Stair training;Functional mobility training;Therapeutic activities;Therapeutic exercise;Patient/family education    PT Goals (Current goals can be found in the Care Plan section)  Acute Rehab PT Goals Patient Stated Goal: to get all this junk up and out (of lungs) PT Goal Formulation: With patient Time For Goal Achievement: 08/16/20 Potential to Achieve Goals: Good    Frequency Min 2X/week   Barriers to discharge        Co-evaluation               AM-PAC PT "6 Clicks" Mobility  Outcome Measure Help needed turning from your back to your side while in a flat bed without using bedrails?: None Help needed moving from lying on your back to sitting on the side of a flat bed without using bedrails?: None Help needed moving to and from a bed to a chair (including a wheelchair)?: A Little Help needed standing up from a chair using your arms (e.g., wheelchair or bedside chair)?: A Little Help needed to walk in hospital room?: A Little Help needed climbing 3-5 steps with a railing? : A Little 6 Click Score: 20    End of Session Equipment Utilized During Treatment:  Oxygen Activity Tolerance: Patient tolerated treatment well Patient left: in chair;with call bell/phone within reach;with chair alarm set Nurse Communication: Mobility status PT Visit Diagnosis: Muscle weakness (generalized) (M62.81);Difficulty in walking, not elsewhere classified (R26.2)    Time: 0981-1914 PT Time Calculation (min) (ACUTE ONLY): 32 min   Charges:   PT Evaluation $PT Eval Moderate Complexity: 1 Mod PT Treatments $Therapeutic Activity: 8-22 mins       Nemesis Rainwater H. Manson Passey, PT, DPT, NCS 08/02/20, 12:19 PM [phone]

## 2020-08-03 DIAGNOSIS — J9601 Acute respiratory failure with hypoxia: Secondary | ICD-10-CM | POA: Diagnosis not present

## 2020-08-03 DIAGNOSIS — E43 Unspecified severe protein-calorie malnutrition: Secondary | ICD-10-CM | POA: Insufficient documentation

## 2020-08-03 DIAGNOSIS — I10 Essential (primary) hypertension: Secondary | ICD-10-CM | POA: Diagnosis not present

## 2020-08-03 DIAGNOSIS — J441 Chronic obstructive pulmonary disease with (acute) exacerbation: Secondary | ICD-10-CM | POA: Diagnosis not present

## 2020-08-03 LAB — URINE CULTURE: Culture: NO GROWTH

## 2020-08-03 LAB — BASIC METABOLIC PANEL
Anion gap: 11 (ref 5–15)
BUN: 31 mg/dL — ABNORMAL HIGH (ref 8–23)
CO2: 28 mmol/L (ref 22–32)
Calcium: 9.8 mg/dL (ref 8.9–10.3)
Chloride: 102 mmol/L (ref 98–111)
Creatinine, Ser: 0.62 mg/dL (ref 0.44–1.00)
GFR, Estimated: >60 mL/min (ref >60)
Glucose, Bld: 145 mg/dL — ABNORMAL HIGH (ref 70–99)
Potassium: 4.9 mmol/L (ref 3.5–5.1)
Sodium: 141 mmol/L (ref 135–145)

## 2020-08-03 LAB — CBC
HCT: 39.4 % (ref 36.0–46.0)
Hemoglobin: 13 g/dL (ref 12.0–15.0)
MCH: 29.5 pg (ref 26.0–34.0)
MCHC: 33 g/dL (ref 30.0–36.0)
MCV: 89.3 fL (ref 80.0–100.0)
Platelets: 551 10*3/uL — ABNORMAL HIGH (ref 150–400)
RBC: 4.41 MIL/uL (ref 3.87–5.11)
RDW: 13.2 % (ref 11.5–15.5)
WBC: 32.2 10*3/uL — ABNORMAL HIGH (ref 4.0–10.5)
nRBC: 0 % (ref 0.0–0.2)

## 2020-08-03 NOTE — Progress Notes (Signed)
[facility] PROGRESS NOTE   [name] TMA:[id number] DOB: 05-23-1950 DOA: 08/01/2020  PCP: [name], MD  Brief History/Interval Summary: 70 y.o. female with medical history significant of anxiety, depression, asthma, COPD, hypertension, C. difficile, CVA, cervical dystonia, chronic pain, fibromyalgia, seizures, GERD, prolonged QT, substance use who presented with ongoing shortness of breath.  Patient reported cough with greenish expectoration.  Chest x-ray showed right middle lobe pneumonia.  Patient was also wheezing while admitted.  She was hospitalized for further management.  Reason for Visit: COPD with acute exacerbation.  Community-acquired pneumonia  Consultants: None  Procedures: None  Antibiotics: Anti-infectives (From admission, onward)   Start     Dose/Rate Route Frequency Ordered Stop   08/01/20 2000  azithromycin (ZITHROMAX) 500 mg in sodium chloride 0.9 % 250 mL IVPB        500 mg 250 mL/hr over 60 Minutes Intravenous Every 24 hours 08/01/20 1840 08/06/20 1959   08/01/20 1900  cefTRIAXone (ROCEPHIN) 2 g in sodium chloride 0.9 % 100 mL IVPB        2 g 200 mL/hr over 30 Minutes Intravenous Every 24 hours 08/01/20 1840 08/06/20 1859      Subjective/Interval History: Patient mentioned that she is feeling slightly better this morning.  Shortness of breath is improving though she still continues to wheeze.  Continues to have a dry cough.  No chest pain.      Assessment/Plan:  COPD with acute exacerbation/acute respiratory failure with hypoxia/community-acquired pneumonia WBC noted to be significantly elevated.  WBC remains high which now could be due to steroids.  Continue to monitor. She has new oxygen requirements.  Does not use oxygen at home.   Patient seems to be slightly better from symptom standpoint though she continues to have significant wheezing on examination.  Continue systemic steroids, inhaled steroids, antibiotics.  Continue  nebulizer treatments.  Headache No neurological deficits.  Likely due to acute illness.  Analgesic agents.  Seems to be better today.  History of chronic pain syndrome/fibromyalgia/history of substance use Patient is on Subutex daily.  This is being continued.  Essential hypertension/chronic diastolic CHF/Prolonged QT interval Was noted to be borderline low yesterday.  Carvedilol dose has been decreased.  QTC was normal on EKG check during this admission.   Seems to be stable from a volume standpoint.    GERD Continue PPI  History of anxiety and depression Continue home medications.  History of stroke This was likely a complication of meningitis.  She also had seizure at that time.  Has some residual left-sided weakness.  Seen by physical therapy.  Home health is recommended.   DVT Prophylaxis: Lovenox Code Status: DNR Family Communication: No family at bedside Disposition Plan: Hopefully return home when improved.  Status is: Inpatient  Remains inpatient appropriate because:IV treatments appropriate due to intensity of illness or inability to take PO and Inpatient level of care appropriate due to severity of illness   Dispo: The patient is from: Home              Anticipated d/c is to: Home              Patient currently is not medically stable to d/c.   Difficult to place patient No       Medications:  Scheduled: . buprenorphine  2 mg Sublingual Daily  . carvedilol  3.125 mg Oral BID WC  . enoxaparin (LOVENOX) injection  30 mg Subcutaneous Q24H  . feeding supplement  237 mL  Oral TID BM  . ipratropium-albuterol  3 mL Nebulization QID  . methylPREDNISolone (SOLU-MEDROL) injection  40 mg Intravenous Q6H  . mometasone-formoterol  2 puff Inhalation BID  . multivitamin with minerals  1 tablet Oral Daily  . pantoprazole  40 mg Oral Daily  . sodium chloride flush  3 mL Intravenous Q12H  . temazepam  22.5 mg Oral QHS   Continuous: . azithromycin 500 mg (08/02/20  2027)  . cefTRIAXone (ROCEPHIN)  IV 2 g (08/02/20 1844)   CZY:SAYTKZSWFUXNA **OR** acetaminophen, albuterol, diazepam, guaiFENesin, polyethylene glycol, simethicone   Objective:  Vital Signs  Vitals:   08/03/20 0017 08/03/20 0500 08/03/20 0528 08/03/20 0719  BP: (!) 99/50  127/71 117/63  Pulse: 61  82 70  Resp: 16  16 16   Temp: (!) 97.5 F (36.4 C)  98.2 F (36.8 C) 97.7 F (36.5 C)  TempSrc: Oral  Oral Oral  SpO2: 96%  96% 97%  Weight:  37.4 kg    Height:        Intake/Output Summary (Last 24 hours) at 08/03/2020 0932 Last data filed at 08/02/2020 1817 Gross per 24 hour  Intake 817 ml  Output --  Net 817 ml   Filed Weights   08/01/20 2059 08/02/20 0500 08/03/20 0500  Weight: 37 kg 37 kg 37.4 kg    General appearance: Awake alert.  In no distress.  Noted to be fatigued. Resp: Mildly tachypneic at rest.  Coarse breath sounds with wheezing bilaterally.  Few crackles at the bases bilaterally. Cardio: S1-S2 is normal regular.  No S3-S4.  No rubs murmurs or bruit GI: Abdomen is soft.  Nontender nondistended.  Bowel sounds are present normal.  No masses organomegaly Extremities: No edema.  Full range of motion of lower extremities. Neurologic: Alert and oriented x3.  No focal neurological deficits.     Lab Results:  Data Reviewed: I have personally reviewed following labs and imaging studies  CBC: Recent Labs  Lab 08/01/20 1404 08/02/20 0437 08/03/20 0514  WBC 30.4* 30.8* 32.2*  HGB 13.3 13.7 13.0  HCT 39.3 41.8 39.4  MCV 86.4 88.9 89.3  PLT 522* 516* 551*    Basic Metabolic Panel: Recent Labs  Lab 08/01/20 1404 08/02/20 0437 08/03/20 0514  NA 138 142 141  K 3.6 3.8 4.9  CL 99 104 102  CO2 29 27 28   GLUCOSE 131* 139* 145*  BUN 20 17 31*  CREATININE 0.61 0.50 0.62  CALCIUM 9.4 9.2 9.8    GFR: Estimated Creatinine Clearance: 39.7 mL/min (by C-G formula based on SCr of 0.62 mg/dL).  Liver Function Tests: Recent Labs  Lab 08/01/20 1739  08/02/20 0437  AST 26 22  ALT 35 31  ALKPHOS 130* 129*  BILITOT 0.8 0.8  PROT 7.4 7.5  ALBUMIN 3.7 3.6    Coagulation Profile: Recent Labs  Lab 08/01/20 1744  INR 1.1      Recent Results (from the past 240 hour(s))  Resp Panel by RT-PCR (Flu A&B, Covid) Nasopharyngeal Swab     Status: None   Collection Time: 08/01/20  5:39 PM   Specimen: Nasopharyngeal Swab; Nasopharyngeal(NP) swabs in vial transport medium  Result Value Ref Range Status   SARS Coronavirus 2 by RT PCR NEGATIVE NEGATIVE Final    Comment: (NOTE) SARS-CoV-2 target nucleic acids are NOT DETECTED.  The SARS-CoV-2 RNA is generally detectable in upper respiratory specimens during the acute phase of infection. The lowest concentration of SARS-CoV-2 viral copies this assay can detect is 138 copies/mL.  A negative result does not preclude SARS-Cov-2 infection and should not be used as the sole basis for treatment or other patient management decisions. A negative result may occur with  improper specimen collection/handling, submission of specimen other than nasopharyngeal swab, presence of viral mutation(s) within the areas targeted by this assay, and inadequate number of viral copies(<138 copies/mL). A negative result must be combined with clinical observations, patient history, and epidemiological information. The expected result is Negative.  Fact Sheet for Patients:  EntrepreneurPulse.com.au  Fact Sheet for Healthcare Providers:  IncredibleEmployment.be  This test is no t yet approved or cleared by the [place] FDA and  has been authorized for detection and/or diagnosis of SARS-CoV-2 by FDA under an Emergency Use Authorization (EUA). This EUA will remain  in effect (meaning this test can be used) for the duration of the COVID-19 declaration under Section 564(b)(1) of the Act, 21 U.S.C.section 360bbb-3(b)(1), unless the authorization is terminated  or revoked sooner.        Influenza A by PCR NEGATIVE NEGATIVE Final   Influenza B by PCR NEGATIVE NEGATIVE Final    Comment: (NOTE) The Xpert Xpress SARS-CoV-2/FLU/RSV plus assay is intended as an aid in the diagnosis of influenza from Nasopharyngeal swab specimens and should not be used as a sole basis for treatment. Nasal washings and aspirates are unacceptable for Xpert Xpress SARS-CoV-2/FLU/RSV testing.  Fact Sheet for Patients: EntrepreneurPulse.com.au  Fact Sheet for Healthcare Providers: IncredibleEmployment.be  This test is not yet approved or cleared by the [place] FDA and has been authorized for detection and/or diagnosis of SARS-CoV-2 by FDA under an Emergency Use Authorization (EUA). This EUA will remain in effect (meaning this test can be used) for the duration of the COVID-19 declaration under Section 564(b)(1) of the Act, 21 U.S.C. section 360bbb-3(b)(1), unless the authorization is terminated or revoked.  Performed at [facility], [address] [place], [place] [address]   Blood culture (single)     Status: None (Preliminary result)   Collection Time: 08/01/20  5:39 PM   Specimen: BLOOD  Result Value Ref Range Status   Specimen Description BLOOD LEFT ANTECUBITAL  Final   Special Requests   Final    BOTTLES DRAWN AEROBIC AND ANAEROBIC Blood Culture results may not be optimal due to an inadequate volume of blood received in culture bottles   Culture   Final    NO GROWTH < 24 HOURS Performed at [facility], [address], [place], [place] [address]    Report Status PENDING  Incomplete  Urine culture     Status: None   Collection Time: 08/01/20  5:59 PM   Specimen: In/Out Cath Urine  Result Value Ref Range Status   Specimen Description   Final    IN/OUT CATH URINE Performed at [facility], [address], [place], [place] [address]    Special Requests   Final    NONE Performed at [facility], [address], [place], [place] [address]    Culture   Final    NO GROWTH Performed at [facility], [place] [address], [place],  [address]    Report Status 08/03/2020 FINAL  Final      Radiology Studies: DG Chest 2 View  Result Date: 08/01/2020 CLINICAL DATA:  Productive cough and increasing shortness of breath over the past week. EXAM: CHEST - 2 VIEW COMPARISON:  PA and lateral chest 01/04/2018 and 05/06/2020. FINDINGS: There is airspace disease in the right  middle lobe. Lungs are otherwise clear. Emphysema noted. Aortic atherosclerosis is seen. Heart size is normal. No pneumothorax or fluid. No acute focal bony abnormality. IMPRESSION: Airspace disease in the right middle lobe worrisome for pneumonia. Aortic Atherosclerosis (ICD10-I70.0) and Emphysema (ICD10-J43.9). Electronically Signed   By: Inge Rise M.D.   On: 08/01/2020 14:46       LOS: 1 day   [facility] Pager on [email or web address]  08/03/2020, 9:32 AM

## 2020-08-03 NOTE — TOC Progression Note (Signed)
Transition of Care [facility]) - Progression Note    Patient Details  Name: Latiana Tomei MRN: [id number] Date of Birth: [birth year]  [facility] CM/SW Contact  Shelbie Hutching, RN Phone Number: 08/03/2020, 9:45 AM  Clinical Narrative:    PT has recommended home health, [place] with [facility] has accepted referral for RN, PT, and OT.    Expected Discharge Plan: [place] Barriers to Discharge: Continued Medical Work up  Expected Discharge Plan and Services Expected Discharge Plan: [place]   Discharge Planning Services: CM Consult Post Acute Care Choice: [place] arrangements for the past 2 months: Single Family Home                 DME Arranged: N/A DME Agency: NA       HH Arranged: RN,PT,OT [place] Agency: [facility] Date [place]: 08/03/20 Time [place]: [phone] Representative spoke with at [place]: Britt[place]any   Social Determinants of Health ([place]) Interventions    Readmission Risk Interventions No flowsheet data found.

## 2020-08-03 NOTE — Care Management Important Message (Signed)
Important Message  Patient Details  Name: Io Dieujuste MRN: [id number] Date of Birth: 1950-12-05   Medicare Important Message Given:  N/A - LOS <3 / Initial given by admissions  Initial Medicare IM reviewed with patient by S. Geanie Cooley, Patient Access Associate on 08/03/2020 at 10:28am.     Dannette Barbara 08/03/2020, 7:10 PM

## 2020-08-04 DIAGNOSIS — I10 Essential (primary) hypertension: Secondary | ICD-10-CM | POA: Diagnosis not present

## 2020-08-04 DIAGNOSIS — J9601 Acute respiratory failure with hypoxia: Secondary | ICD-10-CM | POA: Diagnosis not present

## 2020-08-04 DIAGNOSIS — J441 Chronic obstructive pulmonary disease with (acute) exacerbation: Secondary | ICD-10-CM | POA: Diagnosis not present

## 2020-08-04 LAB — CBC
HCT: 36.4 % (ref 36.0–46.0)
Hemoglobin: 11.9 g/dL — ABNORMAL LOW (ref 12.0–15.0)
MCH: 29.3 pg (ref 26.0–34.0)
MCHC: 32.7 g/dL (ref 30.0–36.0)
MCV: 89.7 fL (ref 80.0–100.0)
Platelets: 526 10*3/uL — ABNORMAL HIGH (ref 150–400)
RBC: 4.06 MIL/uL (ref 3.87–5.11)
RDW: 13.3 % (ref 11.5–15.5)
WBC: 21.6 10*3/uL — ABNORMAL HIGH (ref 4.0–10.5)
nRBC: 0 % (ref 0.0–0.2)

## 2020-08-04 LAB — BASIC METABOLIC PANEL
Anion gap: 9 (ref 5–15)
BUN: 31 mg/dL — ABNORMAL HIGH (ref 8–23)
CO2: 28 mmol/L (ref 22–32)
Calcium: 9.5 mg/dL (ref 8.9–10.3)
Chloride: 102 mmol/L (ref 98–111)
Creatinine, Ser: 0.5 mg/dL (ref 0.44–1.00)
GFR, Estimated: >60 mL/min (ref >60)
Glucose, Bld: 130 mg/dL — ABNORMAL HIGH (ref 70–99)
Potassium: 4.5 mmol/L (ref 3.5–5.1)
Sodium: 139 mmol/L (ref 135–145)

## 2020-08-04 MED ORDER — BUDESONIDE 0.5 MG/2ML IN SUSP
0.5000 mg | Freq: Two times a day (BID) | RESPIRATORY_TRACT | Status: DC
  Administered 2020-08-04 – 2020-08-06 (×5): 0.5 mg via RESPIRATORY_TRACT
  Filled 2020-08-04 (×4): qty 2

## 2020-08-04 MED ORDER — BENZONATATE 100 MG PO CAPS
100.0000 mg | ORAL_CAPSULE | Freq: Three times a day (TID) | ORAL | Status: DC
  Administered 2020-08-04 – 2020-08-06 (×7): 100 mg via ORAL
  Filled 2020-08-04 (×7): qty 1

## 2020-08-04 MED ORDER — ENOXAPARIN SODIUM 40 MG/0.4ML IJ SOSY
40.0000 mg | PREFILLED_SYRINGE | INTRAMUSCULAR | Status: DC
  Administered 2020-08-04 – 2020-08-05 (×2): 40 mg via SUBCUTANEOUS
  Filled 2020-08-04 (×2): qty 0.4

## 2020-08-04 NOTE — Progress Notes (Signed)
Physical Therapy Treatment Patient Details Name: [name] MRN: [id number] DOB: Apr 16, 1950 Today's Date: 08/04/2020    History of Present Illness presented to ER secondary to progressive SOB; admitted for management of acute respiratory failure with hypoxia, COPD exacerbation.    PT Comments    Improved mobility tolerance from previous session.  Focused on gait training/balance while monitoring O2 sats. Pt completed 233ft on 2L, on 1L, and on RA maintaining O2 sats at 95%.  No LOB, no c/o SOB.  Dynamic staning balance appears good without LOB with high level challenges.  Pt states she is feeling better and will be ready to return home once MD feels she is medically ready. Continue PT per POC, continue to recommend HHPT services.  Pt left sitting up in chair on RA per nursing request.    Follow Up Recommendations  Home health PT     Equipment Recommendations       Recommendations for Other Services       Precautions / Restrictions Precautions Precautions: Fall Restrictions Weight Bearing Restrictions: No    Mobility  Bed Mobility Overal bed mobility: Modified Independent                  Transfers Overall transfer level: Needs assistance Equipment used: None Transfers: Sit to/from Stand Sit to Stand: Supervision            Ambulation/Gait Ambulation/Gait assistance: Min guard;Supervision Gait Distance (Feet): 500 Feet Assistive device: None Gait Pattern/deviations: WFL(Within Functional Limits);Step-through pattern     General Gait Details: Initially ambulated with short step length and slow cadence transitioning to increased step length and step through pattern. No LOB with frequent turns. Continued guarded posture with UE's held close to chest level   Stairs             Wheelchair Mobility    Modified Rankin (Stroke Patients Only)       Balance Overall balance assessment: Needs assistance Sitting-balance support: No upper extremity  supported;Feet supported Sitting balance-Leahy Scale: Good     Standing balance support: No upper extremity supported Standing balance-Leahy Scale: Fair                 High Level Balance Comments: Pt able to reach for object at floor level without UE support with good demonstration of dynamic balance.            Cognition Arousal/Alertness: Awake/alert Behavior During Therapy: WFL for tasks assessed/performed Overall Cognitive Status: Within Functional Limits for tasks assessed                                        Exercises      General Comments General comments (skin integrity, edema, etc.):  (Pt able to ambulate 458ft on RA with good pace, no rest breaks, while maintaining O2 sats at 95%)      Pertinent Vitals/Pain Pain Assessment: No/denies pain    Home Living                      Prior Function            PT Goals (current goals can now be found in the care plan section) Acute Rehab PT Goals Patient Stated Goal: to get all this junk up and out (of lungs) Progress towards PT goals: Progressing toward goals    Frequency    Min 2X/week  PT Plan Current plan remains appropriate    Co-evaluation              AM-PAC PT "6 Clicks" Mobility   Outcome Measure  Help needed turning from your back to your side while in a flat bed without using bedrails?: None Help needed moving from lying on your back to sitting on the side of a flat bed without using bedrails?: None Help needed moving to and from a bed to a chair (including a wheelchair)?: A Little Help needed standing up from a chair using your arms (e.g., wheelchair or bedside chair)?: A Little Help needed to walk in hospital room?: A Little Help needed climbing 3-5 steps with a railing? : A Little 6 Click Score: 20    End of Session Equipment Utilized During Treatment: Oxygen Activity Tolerance: Patient tolerated treatment well Patient left: in chair;with call  bell/phone within reach;with chair alarm set Nurse Communication: Mobility status (O2 sats) PT Visit Diagnosis: Muscle weakness (generalized) (M62.81);Difficulty in walking, not elsewhere classified (R26.2)     Time: 9923-4144 PT Time Calculation (min) (ACUTE ONLY): 49 min  Charges:  $Gait Training: 23-37 mins $Neuromuscular Re-education: 8-22 mins                    Mikel Cella, PTA   Josie Dixon 08/04/2020, 11:12 AM

## 2020-08-04 NOTE — Plan of Care (Signed)

## 2020-08-04 NOTE — Progress Notes (Signed)
[facility] PROGRESS NOTE   [name] NTI:[id number] DOB: 1950-08-30 DOA: 08/01/2020  PCP: [name], MD  Brief History/Interval Summary: 70 y.o. female with medical history significant of anxiety, depression, asthma, COPD, hypertension, C. difficile, CVA, cervical dystonia, chronic pain, fibromyalgia, seizures, GERD, prolonged QT, substance use who presented with ongoing shortness of breath.  Patient reported cough with greenish expectoration.  Chest x-ray showed right middle lobe pneumonia.  Patient was also wheezing while admitted.  She was hospitalized for further management.  Reason for Visit: COPD with acute exacerbation.  Community-acquired pneumonia  Consultants: None  Procedures: None  Antibiotics: Anti-infectives (From admission, onward)   Start     Dose/Rate Route Frequency Ordered Stop   08/01/20 2000  azithromycin (ZITHROMAX) 500 mg in sodium chloride 0.9 % 250 mL IVPB        500 mg 250 mL/hr over 60 Minutes Intravenous Every 24 hours 08/01/20 1840 08/06/20 1959   08/01/20 1900  cefTRIAXone (ROCEPHIN) 2 g in sodium chloride 0.9 % 100 mL IVPB        2 g 200 mL/hr over 30 Minutes Intravenous Every 24 hours 08/01/20 1840 08/06/20 1859      Subjective/Interval History: Patient mentions that she continues to feel better though she is still wheezing quite a bit.  Discussed with respiratory therapy this morning.  No nausea or vomiting.  Appetite is improving.   Continues to have a dry cough.  Has some discomfort around her ribs whenever she coughs.    Assessment/Plan:  COPD with acute exacerbation/acute respiratory failure with hypoxia/community-acquired pneumonia Patient was started on antibiotics and steroids. WBC was noted to be significantly elevated.  Appears to be improving.  Down to 21.6 this morning.   She has new oxygen requirements.  Does not use oxygen at home.   Continues to have significant wheezing.  We will add nebulized budesonide.   Discussed with RT. Continue ceftriaxone and azithromycin.  Continue systemic steroids.  We will add antitussive agents.  She is also on bronchodilators  Headache Resolved.  History of chronic pain syndrome/fibromyalgia/history of substance use Patient is on Subutex daily.  This is being continued.  Essential hypertension/chronic diastolic CHF/Prolonged QT interval Carvedilol dose was decreased due to borderline low blood pressures.  Now seems to be better.   QTC was normal on EKG check during this admission.   Seems to be stable from a volume standpoint.    GERD Continue PPI  History of anxiety and depression Continue home medications.  History of stroke This was likely a complication of meningitis.  She also had seizure at that time.  Has some residual left-sided weakness.  Seen by physical therapy.  Home health is recommended. Not on any antiplatelet agents at this time.   DVT Prophylaxis: Lovenox Code Status: DNR Family Communication: Discussed with patient. Disposition Plan: Hopefully return home when improved.  Continue to mobilize.  Status is: Inpatient  Remains inpatient appropriate because:IV treatments appropriate due to intensity of illness or inability to take PO and Inpatient level of care appropriate due to severity of illness   Dispo: The patient is from: Home              Anticipated d/c is to: Home              Patient currently is not medically stable to d/c.   Difficult to place patient No       Medications:  Scheduled: . budesonide (PULMICORT) nebulizer solution  0.5 mg  Nebulization BID  . buprenorphine  2 mg Sublingual Daily  . carvedilol  3.125 mg Oral BID WC  . enoxaparin (LOVENOX) injection  30 mg Subcutaneous Q24H  . feeding supplement  237 mL Oral TID BM  . ipratropium-albuterol  3 mL Nebulization QID  . methylPREDNISolone (SOLU-MEDROL) injection  40 mg Intravenous Q6H  . multivitamin with minerals  1 tablet Oral Daily  . pantoprazole   40 mg Oral Daily  . sodium chloride flush  3 mL Intravenous Q12H  . temazepam  22.5 mg Oral QHS   Continuous: . azithromycin Stopped (08/03/20 2102)  . cefTRIAXone (ROCEPHIN)  IV Stopped (08/03/20 1846)   KNL:[id number] **OR** acetaminophen, albuterol, diazepam, guaiFENesin, polyethylene glycol, simethicone   Objective:  Vital Signs  Vitals:   08/04/20 0354 08/04/20 0500 08/04/20 0802 08/04/20 1000  BP: 131/74  127/63   Pulse: 62  78   Resp: 14  18   Temp: (!) 97.5 F (36.4 C)  98.1 F (36.7 C)   TempSrc: Oral     SpO2: 100%  95%   Weight:  47.8 kg  47.8 kg  Height:        Intake/Output Summary (Last 24 hours) at 08/04/2020 1058 Last data filed at 08/04/2020 0320 Gross per 24 hour  Intake 945.5 ml  Output --  Net 945.5 ml   Filed Weights   08/03/20 0500 08/04/20 0500 08/04/20 1000  Weight: 37.4 kg 47.8 kg 47.8 kg    General appearance: Awake alert.  In no distress.  Remains fatigued Resp: Mildly tachypneic at rest.  Continues to have diffuse wheezing bilaterally.  Few crackles in the bases. Cardio: S1-S2 is normal regular.  No S3-S4.  No rubs murmurs or bruit GI: Abdomen is soft.  Nontender nondistended.  Bowel sounds are present normal.  No masses organomegaly Extremities: No edema.  Full range of motion of lower extremities. Neurologic: Alert and oriented x3.  No focal neurological deficits.      Lab Results:  Data Reviewed: I have personally reviewed following labs and imaging studies  CBC: Recent Labs  Lab 08/01/20 1404 08/02/20 0437 08/03/20 0514 08/04/20 0617  WBC 30.4* 30.8* 32.2* 21.6*  HGB 13.3 13.7 13.0 11.9*  HCT 39.3 41.8 39.4 36.4  MCV 86.4 88.9 89.3 89.7  PLT 522* 516* 551* 526*    Basic Metabolic Panel: Recent Labs  Lab 08/01/20 1404 08/02/20 0437 08/03/20 0514 08/04/20 0617  NA 138 142 141 139  K 3.6 3.8 4.9 4.5  CL 99 104 102 102  CO2 29 27 28 28   GLUCOSE 131* 139* 145* 130*  BUN 20 17 31* 31*  CREATININE 0.61 0.50  0.62 0.50  CALCIUM 9.4 9.2 9.8 9.5    GFR: Estimated Creatinine Clearance: 50.8 mL/min (by C-G formula based on SCr of 0.5 mg/dL).  Liver Function Tests: Recent Labs  Lab 08/01/20 1739 08/02/20 0437  AST 26 22  ALT 35 31  ALKPHOS 130* 129*  BILITOT 0.8 0.8  PROT 7.4 7.5  ALBUMIN 3.7 3.6    Coagulation Profile: Recent Labs  Lab 08/01/20 1744  INR 1.1      Recent Results (from the past 240 hour(s))  Resp Panel by RT-PCR (Flu A&B, Covid) Nasopharyngeal Swab     Status: None   Collection Time: 08/01/20  5:39 PM   Specimen: Nasopharyngeal Swab; Nasopharyngeal(NP) swabs in vial transport medium  Result Value Ref Range Status   SARS Coronavirus 2 by RT PCR NEGATIVE NEGATIVE Final    Comment: (NOTE)  SARS-CoV-2 target nucleic acids are NOT DETECTED.  The SARS-CoV-2 RNA is generally detectable in upper respiratory specimens during the acute phase of infection. The lowest concentration of SARS-CoV-2 viral copies this assay can detect is 138 copies/mL. A negative result does not preclude SARS-Cov-2 infection and should not be used as the sole basis for treatment or other patient management decisions. A negative result may occur with  improper specimen collection/handling, submission of specimen other than nasopharyngeal swab, presence of viral mutation(s) within the areas targeted by this assay, and inadequate number of viral copies(<138 copies/mL). A negative result must be combined with clinical observations, patient history, and epidemiological information. The expected result is Negative.  Fact Sheet for Patients:  EntrepreneurPulse.com.au  Fact Sheet for Healthcare Providers:  IncredibleEmployment.be  This test is no t yet approved or cleared by the [place] FDA and  has been authorized for detection and/or diagnosis of SARS-CoV-2 by FDA under an Emergency Use Authorization (EUA). This EUA will remain  in effect (meaning  this test can be used) for the duration of the COVID-19 declaration under Section 564(b)(1) of the Act, 21 U.S.C.section 360bbb-3(b)(1), unless the authorization is terminated  or revoked sooner.       Influenza A by PCR NEGATIVE NEGATIVE Final   Influenza B by PCR NEGATIVE NEGATIVE Final    Comment: (NOTE) The Xpert Xpress SARS-CoV-2/FLU/RSV plus assay is intended as an aid in the diagnosis of influenza from Nasopharyngeal swab specimens and should not be used as a sole basis for treatment. Nasal washings and aspirates are unacceptable for Xpert Xpress SARS-CoV-2/FLU/RSV testing.  Fact Sheet for Patients: EntrepreneurPulse.com.au  Fact Sheet for Healthcare Providers: IncredibleEmployment.be  This test is not yet approved or cleared by the [place] FDA and has been authorized for detection and/or diagnosis of SARS-CoV-2 by FDA under an Emergency Use Authorization (EUA). This EUA will remain in effect (meaning this test can be used) for the duration of the COVID-19 declaration under Section 564(b)(1) of the Act, 21 U.S.C. section 360bbb-3(b)(1), unless the authorization is terminated or revoked.  Performed at [facility], [address] [place], [place] [address]   Blood culture (single)     Status: None (Preliminary result)   Collection Time: 08/01/20  5:39 PM   Specimen: BLOOD  Result Value Ref Range Status   Specimen Description BLOOD LEFT ANTECUBITAL  Final   Special Requests   Final    BOTTLES DRAWN AEROBIC AND ANAEROBIC Blood Culture results may not be optimal due to an inadequate volume of blood received in culture bottles   Culture   Final    NO GROWTH 3 DAYS Performed at [facility], [address], [place], [place] [address]    Report Status PENDING  Incomplete  Urine culture     Status: None   Collection Time: 08/01/20  5:59 PM   Specimen: In/Out Cath Urine  Result Value Ref Range Status    Specimen Description   Final    IN/OUT CATH URINE Performed at [facility], [address], [place], [place] [address]    Special Requests   Final    NONE Performed at [facility], [address], [place], [place] [address]    Culture   Final    NO GROWTH Performed at [facility], [place] [address] [place], [place] [address]    Report Status 08/03/2020 FINAL  Final      Radiology Studies: No results found.  LOS: 2 days   [facility] Pager on [email or web address]  08/04/2020, 10:58 AM

## 2020-08-05 ENCOUNTER — Encounter: Payer: Self-pay | Admitting: Internal Medicine

## 2020-08-05 DIAGNOSIS — J441 Chronic obstructive pulmonary disease with (acute) exacerbation: Secondary | ICD-10-CM | POA: Diagnosis not present

## 2020-08-05 DIAGNOSIS — I10 Essential (primary) hypertension: Secondary | ICD-10-CM | POA: Diagnosis not present

## 2020-08-05 DIAGNOSIS — J9601 Acute respiratory failure with hypoxia: Secondary | ICD-10-CM | POA: Diagnosis not present

## 2020-08-05 LAB — CBC
HCT: 37.1 % (ref 36.0–46.0)
Hemoglobin: 12.3 g/dL (ref 12.0–15.0)
MCH: 29.3 pg (ref 26.0–34.0)
MCHC: 33.2 g/dL (ref 30.0–36.0)
MCV: 88.3 fL (ref 80.0–100.0)
Platelets: 557 10*3/uL — ABNORMAL HIGH (ref 150–400)
RBC: 4.2 MIL/uL (ref 3.87–5.11)
RDW: 13.5 % (ref 11.5–15.5)
WBC: 14.2 10*3/uL — ABNORMAL HIGH (ref 4.0–10.5)
nRBC: 0 % (ref 0.0–0.2)

## 2020-08-05 LAB — BASIC METABOLIC PANEL
Anion gap: 9 (ref 5–15)
BUN: 32 mg/dL — ABNORMAL HIGH (ref 8–23)
CO2: 30 mmol/L (ref 22–32)
Calcium: 9.4 mg/dL (ref 8.9–10.3)
Chloride: 101 mmol/L (ref 98–111)
Creatinine, Ser: 0.57 mg/dL (ref 0.44–1.00)
GFR, Estimated: >60 mL/min (ref >60)
Glucose, Bld: 130 mg/dL — ABNORMAL HIGH (ref 70–99)
Potassium: 4.4 mmol/L (ref 3.5–5.1)
Sodium: 140 mmol/L (ref 135–145)

## 2020-08-05 MED ORDER — PREDNISONE 50 MG PO TABS
60.0000 mg | ORAL_TABLET | Freq: Two times a day (BID) | ORAL | Status: DC
  Administered 2020-08-05 – 2020-08-06 (×2): 60 mg via ORAL
  Filled 2020-08-05 (×2): qty 1

## 2020-08-05 NOTE — Progress Notes (Signed)
[facility] PROGRESS NOTE   Marda Mir NWG:[id number] DOB: 1950-08-17 DOA: 08/01/2020  PCP: Frederich Chick., MD  Brief History/Interval Summary: 70 y.o. female with medical history significant of anxiety, depression, asthma, COPD, hypertension, C. difficile, CVA, cervical dystonia, chronic pain, fibromyalgia, seizures, GERD, prolonged QT, substance use who presented with ongoing shortness of breath.  Patient reported cough with greenish expectoration.  Chest x-ray showed right middle lobe pneumonia.  Patient was also wheezing while admitted.  She was hospitalized for further management.  Reason for Visit: COPD with acute exacerbation.  Community-acquired pneumonia  Consultants: None  Procedures: None  Antibiotics: Anti-infectives (From admission, onward)   Start     Dose/Rate Route Frequency Ordered Stop   08/01/20 2000  azithromycin (ZITHROMAX) 500 mg in sodium chloride 0.9 % 250 mL IVPB        500 mg 250 mL/hr over 60 Minutes Intravenous Every 24 hours 08/01/20 1840 08/06/20 1959   08/01/20 1900  cefTRIAXone (ROCEPHIN) 2 g in sodium chloride 0.9 % 100 mL IVPB        2 g 200 mL/hr over 30 Minutes Intravenous Every 24 hours 08/01/20 1840 08/06/20 1859      Subjective/Interval History: Patient mentions that she is feeling better.  Shortness of breath is improved.  Cough is getting better.  She was able to ambulate yesterday without much difficulty.  No chest pain.  No nausea or vomiting.      Assessment/Plan:  COPD with acute exacerbation/acute respiratory failure with hypoxia/community-acquired pneumonia Patient was started on antibiotics and steroids. WBC was noted to be significantly elevated.  On presentation it was 30.8.  Now improved to 14.2. Patient was initially requiring oxygen.  Requirement seems to be coming down.  She is noted to be on room air this morning.  We will need to check ambulatory pulse ox prior to discharge to see if she would need any  home oxygen. Patient was started on nebulize budesonide yesterday.  Lung examination is better today.  Continue for another 24 hours. We will change her to oral steroids from this evening. Continue ceftriaxone and azithromycin to complete a 5-day course. Continue antitussive agents.  Continue bronchodilators  History of chronic pain syndrome/fibromyalgia/history of substance use Patient is on Subutex daily.  This is being continued.  Essential hypertension/chronic diastolic CHF/Prolonged QT interval Carvedilol dose was decreased due to borderline low blood pressures.  Blood pressure had improved but now noted to be in the hypertensive side.  Heart rate noted to be in the 50s.  May need to use alternative agents.  Will see how the trends are over the next 24 hours.  Steroids could also be contributing to elevated blood pressure.   QTC was normal on EKG check during this admission.   Seems to be stable from a volume standpoint.    GERD Continue PPI  History of anxiety and depression Continue home medications.  History of stroke This was likely a complication of meningitis.  She also had seizure at that time.  Has some residual left-sided weakness.  Seen by physical therapy.  Home health is recommended. Not on any antiplatelet agents at this time.  Headache Resolved.  DVT Prophylaxis: Lovenox Code Status: DNR Family Communication: Discussed with patient. Disposition Plan: Hopefully return home when improved.  Anticipate discharge tomorrow.  Status is: Inpatient  Remains inpatient appropriate because:IV treatments appropriate due to intensity of illness or inability to take PO and Inpatient level of care appropriate due to severity of illness  Dispo: The patient is from: Home              Anticipated d/c is to: Home              Patient currently is not medically stable to d/c.   Difficult to place patient No       Medications:  Scheduled: . benzonatate  100 mg Oral TID   . budesonide (PULMICORT) nebulizer solution  0.5 mg Nebulization BID  . buprenorphine  2 mg Sublingual Daily  . carvedilol  3.125 mg Oral BID WC  . enoxaparin (LOVENOX) injection  40 mg Subcutaneous Q24H  . feeding supplement  237 mL Oral TID BM  . ipratropium-albuterol  3 mL Nebulization QID  . methylPREDNISolone (SOLU-MEDROL) injection  40 mg Intravenous Q6H  . multivitamin with minerals  1 tablet Oral Daily  . pantoprazole  40 mg Oral Daily  . sodium chloride flush  3 mL Intravenous Q12H  . temazepam  22.5 mg Oral QHS   Continuous: . azithromycin 500 mg (08/04/20 2329)  . cefTRIAXone (ROCEPHIN)  IV Stopped (08/04/20 2145)   ACZ:[id number] **OR** acetaminophen, albuterol, diazepam, polyethylene glycol, simethicone   Objective:  Vital Signs  Vitals:   08/04/20 2324 08/05/20 0402 08/05/20 0600 08/05/20 0822  BP: (!) 162/80 (!) 155/80  (!) 164/66  Pulse: 70 (!) 57  (!) 59  Resp: 16 15  15   Temp: 98.1 F (36.7 C) (!) 97.5 F (36.4 C)  97.6 F (36.4 C)  TempSrc:  Oral  Oral  SpO2: 94% 98%  97%  Weight:   48 kg   Height:        Intake/Output Summary (Last 24 hours) at 08/05/2020 0841 Last data filed at 08/05/2020 0546 Gross per 24 hour  Intake 100 ml  Output --  Net 100 ml   Filed Weights   08/04/20 0500 08/04/20 1000 08/05/20 0600  Weight: 47.8 kg 47.8 kg 48 kg    General appearance: Awake alert.  In no distress Resp: Improved effort.  Less wheezing today compared to yesterday.  Few crackles in the bases. Cardio: S1-S2 is normal regular.  No S3-S4.  No rubs murmurs or bruit GI: Abdomen is soft.  Nontender nondistended.  Bowel sounds are present normal.  No masses organomegaly Extremities: No edema.  Full range of motion of lower extremities. Neurologic: Alert and oriented x3.  No focal neurological deficits.      Lab Results:  Data Reviewed: I have personally reviewed following labs and imaging studies  CBC: Recent Labs  Lab 08/01/20 1404  08/02/20 0437 08/03/20 0514 08/04/20 0617 08/05/20 0612  WBC 30.4* 30.8* 32.2* 21.6* 14.2*  HGB 13.3 13.7 13.0 11.9* 12.3  HCT 39.3 41.8 39.4 36.4 37.1  MCV 86.4 88.9 89.3 89.7 88.3  PLT 522* 516* 551* 526* 557*    Basic Metabolic Panel: Recent Labs  Lab 08/01/20 1404 08/02/20 0437 08/03/20 0514 08/04/20 0617 08/05/20 0612  NA 138 142 141 139 140  K 3.6 3.8 4.9 4.5 4.4  CL 99 104 102 102 101  CO2 29 27 28 28 30   GLUCOSE 131* 139* 145* 130* 130*  BUN 20 17 31* 31* 32*  CREATININE 0.61 0.50 0.62 0.50 0.57  CALCIUM 9.4 9.2 9.8 9.5 9.4    GFR: Estimated Creatinine Clearance: 51 mL/min (by C-G formula based on SCr of 0.57 mg/dL).  Liver Function Tests: Recent Labs  Lab 08/01/20 1739 08/02/20 0437  AST 26 22  ALT 35 31  ALKPHOS 130*  129*  BILITOT 0.8 0.8  PROT 7.4 7.5  ALBUMIN 3.7 3.6    Coagulation Profile: Recent Labs  Lab 08/01/20 1744  INR 1.1      Recent Results (from the past 240 hour(s))  Resp Panel by RT-PCR (Flu A&B, Covid) Nasopharyngeal Swab     Status: None   Collection Time: 08/01/20  5:39 PM   Specimen: Nasopharyngeal Swab; Nasopharyngeal(NP) swabs in vial transport medium  Result Value Ref Range Status   SARS Coronavirus 2 by RT PCR NEGATIVE NEGATIVE Final    Comment: (NOTE) SARS-CoV-2 target nucleic acids are NOT DETECTED.  The SARS-CoV-2 RNA is generally detectable in upper respiratory specimens during the acute phase of infection. The lowest concentration of SARS-CoV-2 viral copies this assay can detect is 138 copies/mL. A negative result does not preclude SARS-Cov-2 infection and should not be used as the sole basis for treatment or other patient management decisions. A negative result may occur with  improper specimen collection/handling, submission of specimen other than nasopharyngeal swab, presence of viral mutation(s) within the areas targeted by this assay, and inadequate number of viral copies(<138 copies/mL). A negative  result must be combined with clinical observations, patient history, and epidemiological information. The expected result is Negative.  Fact Sheet for Patients:  BloggerCourse.com  Fact Sheet for Healthcare Providers:  SeriousBroker.it  This test is no t yet approved or cleared by the [place] FDA and  has been authorized for detection and/or diagnosis of SARS-CoV-2 by FDA under an Emergency Use Authorization (EUA). This EUA will remain  in effect (meaning this test can be used) for the duration of the COVID-19 declaration under Section 564(b)(1) of the Act, 21 U.S.C.section 360bbb-3(b)(1), unless the authorization is terminated  or revoked sooner.       Influenza A by PCR NEGATIVE NEGATIVE Final   Influenza B by PCR NEGATIVE NEGATIVE Final    Comment: (NOTE) The Xpert Xpress SARS-CoV-2/FLU/RSV plus assay is intended as an aid in the diagnosis of influenza from Nasopharyngeal swab specimens and should not be used as a sole basis for treatment. Nasal washings and aspirates are unacceptable for Xpert Xpress SARS-CoV-2/FLU/RSV testing.  Fact Sheet for Patients: BloggerCourse.com  Fact Sheet for Healthcare Providers: SeriousBroker.it  This test is not yet approved or cleared by the [place] FDA and has been authorized for detection and/or diagnosis of SARS-CoV-2 by FDA under an Emergency Use Authorization (EUA). This EUA will remain in effect (meaning this test can be used) for the duration of the COVID-19 declaration under Section 564(b)(1) of the Act, 21 U.S.C. section 360bbb-3(b)(1), unless the authorization is terminated or revoked.  Performed at [facility], [address] [place], [place] [address]   Blood culture (single)     Status: None (Preliminary result)   Collection Time: 08/01/20  5:39 PM   Specimen: BLOOD  Result Value Ref Range Status    Specimen Description BLOOD LEFT ANTECUBITAL  Final   Special Requests   Final    BOTTLES DRAWN AEROBIC AND ANAEROBIC Blood Culture results may not be optimal due to an inadequate volume of blood received in culture bottles   Culture   Final    NO GROWTH 4 DAYS Performed at [facility], [address], [place], [place] [address]    Report Status PENDING  Incomplete  Urine culture     Status: None   Collection Time: 08/01/20  5:59 PM   Specimen: In/Out Cath Urine  Result Value Ref Range Status  Specimen Description   Final    IN/OUT CATH URINE Performed at [facility], [address] [place], [place] [address]    Special Requests   Final    NONE Performed at [facility], [address], [place], [place] [address]    Culture   Final    NO GROWTH Performed at [facility], [address], [place], [place] [address]    Report Status 08/03/2020 FINAL  Final      Radiology Studies: No results found.     LOS: 3 days   Bindi Klomp [employer] on [email or web address]  08/05/2020, 8:41 AM

## 2020-08-06 DIAGNOSIS — J441 Chronic obstructive pulmonary disease with (acute) exacerbation: Secondary | ICD-10-CM | POA: Diagnosis not present

## 2020-08-06 LAB — CULTURE, BLOOD (SINGLE): Culture: NO GROWTH

## 2020-08-06 MED ORDER — BENZONATATE 100 MG PO CAPS: 100.0000 mg | ORAL_CAPSULE | Freq: Three times a day (TID) | ORAL | 0 refills | Status: DC

## 2020-08-06 MED ORDER — CIPROFLOXACIN HCL 0.3 % OP SOLN
1.0000 [drp] | OPHTHALMIC | Status: DC
  Administered 2020-08-06 (×2): 1 [drp] via OPHTHALMIC
  Filled 2020-08-06: qty 2.5

## 2020-08-06 MED ORDER — IPRATROPIUM-ALBUTEROL 0.5-2.5 (3) MG/3ML IN SOLN
3.0000 mL | Freq: Four times a day (QID) | RESPIRATORY_TRACT | Status: DC
  Administered 2020-08-06: 15:00:00 3 mL via RESPIRATORY_TRACT
  Filled 2020-08-06 (×2): qty 3

## 2020-08-06 MED ORDER — CIPROFLOXACIN HCL 0.3 % OP SOLN: 1.0000 [drp] | OPHTHALMIC | 0 refills | Status: DC

## 2020-08-06 MED ORDER — PREDNISONE 20 MG PO TABS: ORAL_TABLET | ORAL | 0 refills | Status: DC

## 2020-08-06 MED ORDER — IPRATROPIUM-ALBUTEROL 0.5-2.5 (3) MG/3ML IN SOLN: 3.0000 mL | Freq: Four times a day (QID) | RESPIRATORY_TRACT | 1 refills | Status: DC | PRN

## 2020-08-06 NOTE — Discharge Instructions (Signed)
Chronic Obstructive Pulmonary Disease  Chronic obstructive pulmonary disease (COPD) is a long-term (chronic) lung problem. When you have COPD, it is hard for air to get in and out of your lungs. Usually the condition gets worse over time, and your lungs will never return to normal. There are things you can do to keep yourself as healthy as possible. What are the causes?  Smoking. This is the most common cause.  Certain genes passed from parent to child (inherited). What increases the risk?  Being exposed to secondhand smoke from cigarettes, pipes, or cigars.  Being exposed to chemicals and other irritants, such as fumes and dust in the work environment.  Having chronic lung conditions or infections. What are the signs or symptoms?  Shortness of breath, especially during physical activity.  A long-term cough with a large amount of thick mucus. Sometimes, the cough may not have any mucus (dry cough).  Wheezing.  Breathing quickly.  Skin that looks gray or blue, especially in the fingers, toes, or lips.  Feeling tired (fatigue).  Weight loss.  Chest tightness.  Having infections often.  Episodes when breathing symptoms become much worse (exacerbations). At the later stages of this disease, you may have swelling in the ankles, feet, or legs. How is this treated?  Taking medicines.  Quitting smoking, if you smoke.  Rehabilitation. This includes steps to make your body work better. It may involve a team of specialists.  Doing exercises.  Making changes to your diet.  Using oxygen.  Lung surgery.  Lung transplant.  Comfort measures (palliative care). Follow these instructions at home: Medicines  Take over-the-counter and prescription medicines only as told by your doctor.  Talk to your doctor before taking any cough or allergy medicines. You may need to avoid medicines that cause your lungs to be dry. Lifestyle  If you smoke, stop smoking. Smoking makes the  problem worse.  Do not smoke or use any products that contain nicotine or tobacco. If you need help quitting, ask your doctor.  Avoid being around things that make your breathing worse. This may include smoke, chemicals, and fumes.  Stay active, but remember to rest as well.  Learn and use tips on how to manage stress and control your breathing.  Make sure you get enough sleep. Most adults need at least 7 hours of sleep every night.  Eat healthy foods. Eat smaller meals more often. Rest before meals. Controlled breathing Learn and use tips on how to control your breathing as told by your doctor. Try:  Breathing in (inhaling) through your nose for 1 second. Then, pucker your lips and breath out (exhale) through your lips for 2 seconds.  Putting one hand on your belly (abdomen). Breathe in slowly through your nose for 1 second. Your hand on your belly should move out. Pucker your lips and breathe out slowly through your lips. Your hand on your belly should move in as you breathe out.   Controlled coughing Learn and use controlled coughing to clear mucus from your lungs. Follow these steps: 1. Lean your head a little forward. 2. Breathe in deeply. 3. Try to hold your breath for 3 seconds. 4. Keep your mouth slightly open while coughing 2 times. 5. Spit any mucus out into a tissue. 6. Rest and do the steps again 1 or 2 times as needed. General instructions  Make sure you get all the shots (vaccines) that your doctor recommends. Ask your doctor about a flu shot and a pneumonia shot.    Use oxygen therapy and pulmonary rehabilitation if told by your doctor. If you need home oxygen therapy, ask your doctor if you should buy a tool to measure your oxygen level (oximeter).  Make a COPD action plan with your doctor. This helps you to know what to do if you feel worse than usual.  Manage any other conditions you have as told by your doctor.  Avoid going outside when it is very hot, cold, or  humid.  Avoid people who have a sickness you can catch (contagious).  Keep all follow-up visits. Contact a doctor if:  You cough up more mucus than usual.  There is a change in the color or thickness of the mucus.  It is harder to breathe than usual.  Your breathing is faster than usual.  You have trouble sleeping.  You need to use your medicines more often than usual.  You have trouble doing your normal activities such as getting dressed or walking around the house. Get help right away if:  You have shortness of breath while resting.  You have shortness of breath that stops you from: ? Being able to talk. ? Doing normal activities.  Your chest hurts for longer than 5 minutes.  Your skin color is more blue than usual.  Your pulse oximeter shows that you have low oxygen for longer than 5 minutes.  You have a fever.  You feel too tired to breathe normally. These symptoms may represent a serious problem that is an emergency. Do not wait to see if the symptoms will go away. Get medical help right away. Call your local emergency services (911 in the [place] Do not drive yourself to the hospital. Summary  Chronic obstructive pulmonary disease (COPD) is a long-term lung problem.  The way your lungs work will never return to normal. Usually the condition gets worse over time. There are things you can do to keep yourself as healthy as possible.  Take over-the-counter and prescription medicines only as told by your doctor.  If you smoke, stop. Smoking makes the problem worse. This information is not intended to replace advice given to you by your health care provider. Make sure you discuss any questions you have with your health care provider. Document Revised: 01/04/2020 Document Reviewed: 01/04/2020 Elsevier Patient Education  2021 Elsevier Inc.   

## 2020-08-06 NOTE — Discharge Summary (Signed)
[facility]  Physician Discharge Summary   Patient ID: [name] MRN: [id number] DOB/AGE: 1950/05/06 70 y.o.  Admit date: 08/01/2020 Discharge date: 08/06/2020    PCP: [name], MD  DISCHARGE DIAGNOSES:  COPD with acute exacerbation Acute respiratory failure with hypoxia, resolved Community-acquired pneumonia Chronic pain syndrome and fibromyalgia Essential hypertension Chronic diastolic CHF History of anxiety and depression History of stroke GERD  RECOMMENDATIONS FOR OUTPATIENT FOLLOW UP: 1. Follow-up with PCP    Home Health: PT OT Equipment/Devices: Nebulizer machine  CODE STATUS: DNR  DISCHARGE CONDITION: fair  Diet recommendation: As before  INITIAL HISTORY: 70 y.o.femalewith medical history significant ofanxiety, depression, asthma, COPD, hypertension, C. difficile, CVA, cervical dystonia, chronic pain, fibromyalgia, seizures, GERD, prolonged QT, substance use who presented with ongoing shortness of breath.  Patient reported cough with greenish expectoration.  Chest x-ray showed right middle lobe pneumonia.  Patient was also wheezing while admitted.  She was hospitalized for further management.   HOSPITAL COURSE:   COPD with acute exacerbation/acute respiratory failure with hypoxia/community-acquired pneumonia Patient was started on antibiotics and steroids. WBC was noted to be significantly elevated.  On presentation it was 30.8.  Now improved to 14.2. Patient was initially requiring oxygen.    Was successfully weaned off of oxygen and seems to be saturating well on room air.  Was ambulated and saturations maintained in the 90s.   She will be discharged on steroid taper along with inhaled steroids.  She has completed course of antibiotics.  Antitussive agents will also be prescribed.  Nebulizer machine will also be ordered.    History of chronic pain syndrome/fibromyalgia/history of substance use Patient is on Subutex daily.     Essential hypertension/chronic diastolic CHF/Prolonged QT interval Continue home medications.   QTC was normal on EKG check during this admission.   Seems to be stable from a volume standpoint.    GERD Continue PPI  History of anxiety and depression Continue home medications.  History of stroke This was likely a complication of meningitis.  She also had seizure at that time.  Has some residual left-sided weakness.  Seen by physical therapy.  Home health is recommended. Not on any antiplatelet agents at this time.  Severe protein calorie malnutrition Nutrition Problem: Severe Malnutrition Etiology: chronic illness (COPD, CHF, substance abuse)  Signs/Symptoms: severe fat depletion,severe muscle depletion  Interventions: Ensure Enlive (each supplement provides 350kcal and 20 grams of protein),Liberalize Diet  Patient is stable.  She feels well.  Okay for discharge home today.   PERTINENT LABS:  The results of significant diagnostics from this hospitalization (including imaging, microbiology, ancillary and laboratory) are listed below for reference.    Microbiology: Recent Results (from the past 240 hour(s))  Resp Panel by RT-PCR (Flu A&B, Covid) Nasopharyngeal Swab     Status: None   Collection Time: 08/01/20  5:39 PM   Specimen: Nasopharyngeal Swab; Nasopharyngeal(NP) swabs in vial transport medium  Result Value Ref Range Status   SARS Coronavirus 2 by RT PCR NEGATIVE NEGATIVE Final    Comment: (NOTE) SARS-CoV-2 target nucleic acids are NOT DETECTED.  The SARS-CoV-2 RNA is generally detectable in upper respiratory specimens during the acute phase of infection. The lowest concentration of SARS-CoV-2 viral copies this assay can detect is 138 copies/mL. A negative result does not preclude SARS-Cov-2 infection and should not be used as the sole basis for treatment or other patient management decisions. A negative result may occur with  improper specimen  collection/handling, submission of specimen other  than nasopharyngeal swab, presence of viral mutation(s) within the areas targeted by this assay, and inadequate number of viral copies(<138 copies/mL). A negative result must be combined with clinical observations, patient history, and epidemiological information. The expected result is Negative.  Fact Sheet for Patients:  EntrepreneurPulse.com.au  Fact Sheet for Healthcare Providers:  IncredibleEmployment.be  This test is no t yet approved or cleared by the [place] FDA and  has been authorized for detection and/or diagnosis of SARS-CoV-2 by FDA under an Emergency Use Authorization (EUA). This EUA will remain  in effect (meaning this test can be used) for the duration of the COVID-19 declaration under Section 564(b)(1) of the Act, 21 U.S.C.section 360bbb-3(b)(1), unless the authorization is terminated  or revoked sooner.       Influenza A by PCR NEGATIVE NEGATIVE Final   Influenza B by PCR NEGATIVE NEGATIVE Final    Comment: (NOTE) The Xpert Xpress SARS-CoV-2/FLU/RSV plus assay is intended as an aid in the diagnosis of influenza from Nasopharyngeal swab specimens and should not be used as a sole basis for treatment. Nasal washings and aspirates are unacceptable for Xpert Xpress SARS-CoV-2/FLU/RSV testing.  Fact Sheet for Patients: EntrepreneurPulse.com.au  Fact Sheet for Healthcare Providers: IncredibleEmployment.be  This test is not yet approved or cleared by the [place] FDA and has been authorized for detection and/or diagnosis of SARS-CoV-2 by FDA under an Emergency Use Authorization (EUA). This EUA will remain in effect (meaning this test can be used) for the duration of the COVID-19 declaration under Section 564(b)(1) of the Act, 21 U.S.C. section 360bbb-3(b)(1), unless the authorization is terminated or revoked.  Performed at [facility], [address] [place], [place] [address]   Blood culture (single)     Status: None   Collection Time: 08/01/20  5:39 PM   Specimen: BLOOD  Result Value Ref Range Status   Specimen Description BLOOD LEFT ANTECUBITAL  Final   Special Requests   Final    BOTTLES DRAWN AEROBIC AND ANAEROBIC Blood Culture results may not be optimal due to an inadequate volume of blood received in culture bottles   Culture   Final    NO GROWTH 5 DAYS Performed at [facility], [address], [place], [place] [address]    Report Status 08/06/2020 FINAL  Final  Urine culture     Status: None   Collection Time: 08/01/20  5:59 PM   Specimen: In/Out Cath Urine  Result Value Ref Range Status   Specimen Description   Final    IN/OUT CATH URINE Performed at [facility], [address], [place], [place] [address]    Special Requests   Final    NONE Performed at [facility], [address] [place], [place] [address]    Culture   Final    NO GROWTH Performed at [facility], [place] [address] [place], [place] [address]    Report Status 08/03/2020 FINAL  Final     Labs:  COVID-19 Labs   Lab Results  Component Value Date   [place] NEGATIVE 08/01/2020   [place] NEGATIVE 06/30/2020   [place] NEGATIVE 04/20/2020      Basic Metabolic Panel: Recent Labs  Lab 08/01/20 1404 08/02/20 0437 08/03/20 0514 08/04/20 0617 08/05/20 0612  NA 138 142 141 139 140  K 3.6 3.8 4.9 4.5 4.4  CL 99 104 102 102 101  CO2 29 27 28 28 30   GLUCOSE 131* 139* 145* 130* 130*  BUN 20 17  31* 31* 32*  CREATININE 0.61 0.50 0.62 0.50 0.57  CALCIUM 9.4 9.2 9.8 9.5 9.4   Liver Function Tests: Recent Labs  Lab 08/01/20 1739 08/02/20 0437  AST 26 22  ALT 35 31  ALKPHOS 130* 129*  BILITOT 0.8 0.8  PROT 7.4 7.5  ALBUMIN 3.7 3.6   CBC: Recent Labs  Lab 08/01/20 1404 08/02/20 0437 08/03/20 0514 08/04/20 0617 08/05/20 0612  WBC 30.4* 30.8* 32.2*  21.6* 14.2*  HGB 13.3 13.7 13.0 11.9* 12.3  HCT 39.3 41.8 39.4 36.4 37.1  MCV 86.4 88.9 89.3 89.7 88.3  PLT 522* 516* 551* 526* 557*   BNP: BNP (last 3 results) Recent Labs    04/20/20 0825  BNP 82.6     IMAGING STUDIES DG Chest 2 View  Result Date: 08/01/2020 CLINICAL DATA:  Productive cough and increasing shortness of breath over the past week. EXAM: CHEST - 2 VIEW COMPARISON:  PA and lateral chest 01/04/2018 and 05/06/2020. FINDINGS: There is airspace disease in the right middle lobe. Lungs are otherwise clear. Emphysema noted. Aortic atherosclerosis is seen. Heart size is normal. No pneumothorax or fluid. No acute focal bony abnormality. IMPRESSION: Airspace disease in the right middle lobe worrisome for pneumonia. Aortic Atherosclerosis (ICD10-I70.0) and Emphysema (ICD10-J43.9). Electronically Signed   By: Inge Rise M.D.   On: 08/01/2020 14:46    DISCHARGE EXAMINATION: Vitals:   08/06/20 0743 08/06/20 0750 08/06/20 0900 08/06/20 1206  BP: (!) 150/79  (!) 148/74 (!) 156/74  Pulse: 62 65 68 64  Resp: 17 16  17   Temp: (!) 97.5 F (36.4 C)   98.3 F (36.8 C)  TempSrc: Oral     SpO2: 95% 96%  98%  Weight:      Height:       General appearance: Awake alert.  In no distress Resp: Improved air entry bilaterally.  Less wheezing compared to before. Cardio: S1-S2 is normal regular.  No S3-S4.  No rubs murmurs or bruit GI: Abdomen is soft.  Nontender nondistended.  Bowel sounds are present normal.  No masses organomegaly     DISPOSITION: Home  Discharge Instructions    Call MD for:  difficulty breathing, headache or visual disturbances   Complete by: As directed    Call MD for:  extreme fatigue   Complete by: As directed    Call MD for:  persistant dizziness or light-headedness   Complete by: As directed    Call MD for:  persistant nausea and vomiting   Complete by: As directed    Call MD for:  severe uncontrolled pain   Complete by: As directed    Call MD for:   temperature >100.4   Complete by: As directed    Diet general   Complete by: As directed    Discharge instructions   Complete by: As directed    Please take your medications as prescribed.  Be sure to follow-up with your PCP within 1 week.  Take your [facility] on a regular basis and ask your PCP for prescription refills at follow-up.  Seek attention if your right eye swelling does not improve.  You were cared for by a hospitalist during your hospital stay. If you have any questions about your discharge medications or the care you received while you were in the hospital after you are discharged, you can call the unit and asked to speak with the hospitalist on call if the hospitalist that took care of you is not available. Once you are discharged,  your primary care physician will handle any further medical issues. Please note that NO REFILLS for any discharge medications will be authorized once you are discharged, as it is imperative that you return to your primary care physician (or establish a relationship with a primary care physician if you do not have one) for your aftercare needs so that they can reassess your need for medications and monitor your lab values. If you do not have a primary care physician, you can call [phone] for a physician referral.   Increase activity slowly   Complete by: As directed         Allergies as of 08/06/2020      Reactions   Tylenol [acetaminophen] Other (See Comments)   Inflammed Liver   Codeine Nausea And Vomiting   Nsaids Other (See Comments)   Stomach ulcers   Tolmetin Other (See Comments)   Stomach ulcers   Tramadol    GI UPSET      Medication List    STOP taking these medications   pantoprazole 40 MG tablet Commonly known as: PROTONIX     TAKE these medications   albuterol 108 (90 Base) MCG/ACT inhaler Commonly known as: VENTOLIN HFA Inhale 2 puffs into the lungs every 6 (six) hours as needed for wheezing or shortness of breath.    benzonatate 100 MG capsule Commonly known as: TESSALON Take 1 capsule (100 mg total) by mouth 3 (three) times daily.   buprenorphine 2 MG Subl SL tablet Commonly known as: SUBUTEX Place 6 mg under the tongue daily.   carvedilol 6.25 MG tablet Commonly known as: COREG Take 6.25 mg by mouth 2 (two) times daily with a meal.   ciprofloxacin 0.3 % ophthalmic solution Commonly known as: CILOXAN Place 1 drop into the right eye every 4 (four) hours while awake. Administer 1 drop, every 2 hours, while awake, for 2 days. Then 1 drop, every 4 hours, while awake, for the next 5 days.   diazepam 2 MG tablet Commonly known as: VALIUM Take 2 mg by mouth 4 (four) times daily as needed for anxiety.   esomeprazole 40 MG capsule Commonly known as: NEXIUM Take 40 mg by mouth daily at 12 noon.   ipratropium-albuterol 0.5-2.5 (3) MG/3ML Soln Commonly known as: DUONEB Take 3 mLs by nebulization every 6 (six) hours as needed.   mometasone-formoterol 100-5 MCG/ACT Aero Commonly known as: DULERA Inhale 2 puffs into the lungs 2 (two) times daily.   OLANZapine zydis 10 MG disintegrating tablet Commonly known as: ZYPREXA Take 1 tablet (10 mg total) by mouth at bedtime for 10 days.   polyethylene glycol 17 g packet Commonly known as: MiraLax Take 17 g by mouth daily.   predniSONE 20 MG tablet Commonly known as: DELTASONE Take 3 tablets once daily for 4 days followed by 2 tablets once daily for 4 days followed by 1 tablet once daily for 4 days and then stop   scopolamine 1 MG/3DAYS Commonly known as: TRANSDERM-SCOP Place 1 patch (1.5 mg total) onto the skin every 3 (three) days.   simethicone 125 MG chewable tablet Commonly known as: MYLICON Chew 740 mg by mouth every 6 (six) hours as needed for flatulence.   temazepam 22.5 MG capsule Commonly known as: RESTORIL Take 22.5 mg by mouth at bedtime.         Follow-up Information    [name], MD. Schedule an appointment as soon  as possible for a visit in 1 week(s).   Specialty: Family  Medicine Contact information: [address] [place] [address] [phone]               TOTAL DISCHARGE TIME: 8 minutes  [place]  [facility] Pager on [email or web address]  08/07/2020, 12:02 PM

## 2020-08-06 NOTE — Progress Notes (Signed)
   Patient Saturations on Room Air at Rest = 95%  Patient Saturations on [employer] while Ambulating = 95%

## 2020-08-06 NOTE — TOC Transition Note (Signed)
Transition of Care [facility]) - CM/SW Discharge Note   Patient Details  Name: [name] MRN: [id number] Date of Birth: 06-May-1950  [facility] CM/SW Contact:  Izola Price, RN Phone Number: 08/06/2020, 2:08 PM   Clinical Narrative: Patient being discharged to Home with [facility] via [facility]. Confirmed with [place]. DME nebulizer arranged via [employer]. [employer] will deliver to patient's home today. Simmie Davies RN CM         Barriers to Discharge: Continued Medical Work up   Patient Goals and CMS Choice Patient states their goals for this hospitalization and ongoing recovery are:: Patient wants to breath better- agrees to [facility] or SNF if recommended CMS Medicare.gov Compare Post Acute Care list provided to:: Patient Choice offered to / list presented to : Patient  Discharge Placement                       Discharge Plan and Services   Discharge Planning Services: CM Consult Post Acute Care Choice: Home Health          DME Arranged: Nebulizer machine DME Agency: [employer] Date DME Agency Contacted: 08/06/20 Time DME Agency Contacted: [phone] Representative spoke with at DME Agency: [place]: RN,PT,OT [place] Agency: [facility] [place] Date [place]: 08/03/20 Time [place]: [phone] Representative spoke with at [place]: [place]  Social Determinants of Health ([place]) Interventions     Readmission Risk Interventions No flowsheet data found.

## 2020-09-28 ENCOUNTER — Other Ambulatory Visit: Payer: Self-pay

## 2020-09-28 ENCOUNTER — Emergency Department
Admission: EM | Admit: 2020-09-28 | Discharge: 2020-09-29 | Disposition: A | Discharge status: Home / Self Care | Payer: Medicare Other | Attending: Emergency Medicine | Admitting: Emergency Medicine

## 2020-09-28 ENCOUNTER — Emergency Department: Payer: Medicare Other

## 2020-09-28 DIAGNOSIS — F419 Anxiety disorder, unspecified: Secondary | ICD-10-CM | POA: Diagnosis present

## 2020-09-28 DIAGNOSIS — R443 Hallucinations, unspecified: Secondary | ICD-10-CM

## 2020-09-28 DIAGNOSIS — G894 Chronic pain syndrome: Secondary | ICD-10-CM | POA: Diagnosis present

## 2020-09-28 DIAGNOSIS — J449 Chronic obstructive pulmonary disease, unspecified: Secondary | ICD-10-CM | POA: Insufficient documentation

## 2020-09-28 DIAGNOSIS — Z79899 Other long term (current) drug therapy: Secondary | ICD-10-CM | POA: Diagnosis not present

## 2020-09-28 DIAGNOSIS — I110 Hypertensive heart disease with heart failure: Secondary | ICD-10-CM | POA: Insufficient documentation

## 2020-09-28 DIAGNOSIS — Z20822 Contact with and (suspected) exposure to covid-19: Secondary | ICD-10-CM | POA: Diagnosis not present

## 2020-09-28 DIAGNOSIS — I639 Cerebral infarction, unspecified: Secondary | ICD-10-CM | POA: Diagnosis present

## 2020-09-28 DIAGNOSIS — R4182 Altered mental status, unspecified: Secondary | ICD-10-CM | POA: Diagnosis present

## 2020-09-28 DIAGNOSIS — F333 Major depressive disorder, recurrent, severe with psychotic symptoms: Secondary | ICD-10-CM

## 2020-09-28 DIAGNOSIS — R440 Auditory hallucinations: Secondary | ICD-10-CM | POA: Diagnosis not present

## 2020-09-28 DIAGNOSIS — F17210 Nicotine dependence, cigarettes, uncomplicated: Secondary | ICD-10-CM | POA: Insufficient documentation

## 2020-09-28 DIAGNOSIS — G039 Meningitis, unspecified: Secondary | ICD-10-CM | POA: Diagnosis present

## 2020-09-28 DIAGNOSIS — I5032 Chronic diastolic (congestive) heart failure: Secondary | ICD-10-CM | POA: Diagnosis present

## 2020-09-28 DIAGNOSIS — J441 Chronic obstructive pulmonary disease with (acute) exacerbation: Secondary | ICD-10-CM | POA: Diagnosis present

## 2020-09-28 LAB — COMPREHENSIVE METABOLIC PANEL
ALT: 20 U/L (ref 0–44)
AST: 26 U/L (ref 15–41)
Albumin: 4.3 g/dL (ref 3.5–5.0)
Alkaline Phosphatase: 75 U/L (ref 38–126)
Anion gap: 7 (ref 5–15)
BUN: 21 mg/dL (ref 8–23)
CO2: 30 mmol/L (ref 22–32)
Calcium: 9.8 mg/dL (ref 8.9–10.3)
Chloride: 106 mmol/L (ref 98–111)
Creatinine, Ser: 0.55 mg/dL (ref 0.44–1.00)
GFR, Estimated: >60 mL/min (ref >60)
Glucose, Bld: 110 mg/dL — ABNORMAL HIGH (ref 70–99)
Potassium: 3.6 mmol/L (ref 3.5–5.1)
Sodium: 143 mmol/L (ref 135–145)
Total Bilirubin: 0.9 mg/dL (ref 0.3–1.2)
Total Protein: 6.8 g/dL (ref 6.5–8.1)

## 2020-09-28 LAB — CBC WITH DIFFERENTIAL/PLATELET
Abs Immature Granulocytes: 0.02 10*3/uL (ref 0.00–0.07)
Basophils Absolute: 0.1 10*3/uL (ref 0.0–0.1)
Basophils Relative: 1 %
Eosinophils Absolute: 0 10*3/uL (ref 0.0–0.5)
Eosinophils Relative: 0 %
HCT: 41.5 % (ref 36.0–46.0)
Hemoglobin: 13.6 g/dL (ref 12.0–15.0)
Immature Granulocytes: 0 %
Lymphocytes Relative: 26 %
Lymphs Abs: 2.6 10*3/uL (ref 0.7–4.0)
MCH: 28.8 pg (ref 26.0–34.0)
MCHC: 32.8 g/dL (ref 30.0–36.0)
MCV: 87.9 fL (ref 80.0–100.0)
Monocytes Absolute: 0.8 10*3/uL (ref 0.1–1.0)
Monocytes Relative: 8 %
Neutro Abs: 6.2 10*3/uL (ref 1.7–7.7)
Neutrophils Relative %: 65 %
Platelets: 242 10*3/uL (ref 150–400)
RBC: 4.72 MIL/uL (ref 3.87–5.11)
RDW: 14.5 % (ref 11.5–15.5)
WBC: 9.7 10*3/uL (ref 4.0–10.5)
nRBC: 0 % (ref 0.0–0.2)

## 2020-09-28 LAB — SALICYLATE LEVEL: Salicylate Lvl: <7 mg/dL — ABNORMAL LOW (ref 7.0–30.0)

## 2020-09-28 LAB — ETHANOL: Alcohol, Ethyl (B): <10 mg/dL (ref <10)

## 2020-09-28 LAB — RESP PANEL BY RT-PCR (FLU A&B, COVID) ARPGX2
Influenza A by PCR: NEGATIVE
Influenza B by PCR: NEGATIVE
SARS Coronavirus 2 by RT PCR: NEGATIVE

## 2020-09-28 LAB — ACETAMINOPHEN LEVEL: Acetaminophen (Tylenol), Serum: 12 ug/mL (ref 10–30)

## 2020-09-28 MED ORDER — TEMAZEPAM 7.5 MG PO CAPS
22.5000 mg | ORAL_CAPSULE | Freq: Every day | ORAL | Status: DC
  Administered 2020-09-29: 22.5 mg via ORAL
  Filled 2020-09-28: qty 3

## 2020-09-28 MED ORDER — CARVEDILOL 6.25 MG PO TABS
6.2500 mg | ORAL_TABLET | Freq: Two times a day (BID) | ORAL | Status: DC
  Administered 2020-09-29: 6.25 mg via ORAL
  Filled 2020-09-28: qty 1

## 2020-09-28 MED ORDER — OLANZAPINE 5 MG PO TBDP
10.0000 mg | ORAL_TABLET | Freq: Every day | ORAL | Status: DC
  Administered 2020-09-29: 10 mg via ORAL
  Filled 2020-09-28: qty 2

## 2020-09-28 NOTE — Consult Note (Addendum)
[facility] Consult   Reason for Consult: Altered Mental Status and Hallucinations Referring Physician: Dr. Charna Archer Patient Identification: [name] MRN:  [id number] Principal Diagnosis: <principal problem not specified> Diagnosis:  Active Problems:   Anxiety   Stroke (cerebrum) (HCC)   Meningitis   Chronic pain syndrome   COPD with acute exacerbation (HCC)   Chronic diastolic CHF (congestive heart failure) ([place])  Total Time spent with patient: 1 hour  Subjective: "I talked to Jesus and he talks back to me."  [name] is a 70 y.o. female patient presented to [facility] ED POV voluntary. The patient reports, "I've been a little confused, but I'm better now, I was walking away from home, and I was getting lost." The patient reports that she currently lives with her son and daughter-in-law with her grandchildren. The patient reports that she has been experiencing AH but reports that "I was just talking to Jesus" and reports that she hears Jesus talking back. The patient says she last experience some auditory hallucinations "was a few weeks ago"; she denies anything currently. The patient reports that he sleeps well; when we spoke to her son, he shared his mom was awake all night the previous night pacing. She takes her medications but reports a poor appetite lately. The patient does note that she was hospitalized for "6 months" for visual hallucinations and was hospitalized at [facility].   The patient was seen face-to-face by this provider; the chart was reviewed and consulted with Dr. Charna Archer on 09/28/2020 due to the patient's care. It was discussed with the EDP that the patient remained under observation overnight and will be reassessed in the a.m. to determine if she meets the criteria for psychiatric inpatient admission; she could be discharged home. On evaluation, the patient is alert and oriented x 3, calm, cooperative, and mood-congruent with affect.  The  patient does not appear to be responding to internal or external stimuli. Neither is the patient presenting with any delusional thinking. The patient denies auditory or visual hallucinations. The patient denies any suicidal, homicidal, or self-harm ideations. The patient is not presenting with any psychotic or paranoid behaviors. During an encounter with the patient, she could answer questions appropriately.  Per Ms. Handy, Collateral information was obtained from patient's son [name] [phone] who reports that the patient had been hospitalized in March at [facility]. Patient's son reports "she started getting worse after that and she thought she was seeing demons in the room so we took her to [facility] let her go and she was doing a lot better until here recently, she will walk down the street and will be talking to herself."  HPI: Per Dr. Charna Archer, [name] is a 70 y.o. female with past medical history of hypertension, COPD, stroke, chronic pain syndrome, cervical dystonia, prolonged QT, and seizures who presents to the ED for altered mental status.  Patient states that she will often wander and spends much of the day walking back and forth.  She states that she got lost today while she was wandering and "talking to Jesus."  She states that she is assessed nice things to her and she does not have any suicidal or homicidal ideation.  Speaking with her son over the phone, patient had wandered off multiple houses away and could not figure out how to get home.  He states that she has required psychiatric admission in the past but has not been on psychiatric medication in multiple months.  She had seemed  to be doing well up until about a week ago, but since then she has had a gradual decline with increasing hallucinations and anxiety.  Patient denies any medical complaints at this time, states she feels well.  She denies any alcohol or drug abuse.    Past Psychiatric History:  H/O:  substance abuse ([place]) narcotic usage due to chronic back pain Stroke (cerebrum) ([place])   Risk to Self:   Risk to Others:   Prior Inpatient Therapy:   Prior Outpatient Therapy:    Past Medical History:  Past Medical History:  Diagnosis Date   Acute blood loss anemia    Arrhythmia    Arthritis    C. difficile colitis    Cancer ([place])    Diverticulosis    Fibromyalgia    GERD (gastroesophageal reflux disease)    GI hemorrhage    H/O: substance abuse ([place]) 2005   narcotic usage due to chronic back pain   Heart murmur    Hypertension    Pancreatic cyst    Pneumococcal meningitis    PONV (postoperative nausea and vomiting)    Renal cyst    Stroke (cerebrum) [facility])     Past Surgical History:  Procedure Laterality Date   ABDOMINAL HYSTERECTOMY     ABDOMINAL SURGERY     APPENDECTOMY     BIOPSY  04/22/2020   Procedure: BIOPSY;  Surgeon: Milus Banister, MD;  Location: [facility];  Service: Gastroenterology;;   BREAST SURGERY     CHOLECYSTECTOMY N/A 09/30/2012   Procedure: LAPAROSCOPIC CHOLECYSTECTOMY WITH INTRAOPERATIVE CHOLANGIOGRAM;  Surgeon: Imogene Burn. Georgette Dover, MD;  Location: [facility];  Service: General;  Laterality: N/A;   ESOPHAGOGASTRODUODENOSCOPY (EGD) WITH PROPOFOL N/A 04/22/2020   Procedure: ESOPHAGOGASTRODUODENOSCOPY (EGD) WITH PROPOFOL;  Surgeon: Milus Banister, MD;  Location: [facility];  Service: Gastroenterology;  Laterality: N/A;   FRACTURE SURGERY     right upper arm   OVARIAN CYST SURGERY     Family History:  Family History  Problem Relation Age of Onset   Alzheimer's disease Mother    Heart disease Father    Prostate cancer Father    Family Psychiatric  History:  Social History:  Social History   Substance and Sexual Activity  Alcohol Use No     Social History   Substance and Sexual Activity  Drug Use No   Comment: Methadone for narcotic use    Social History   Socioeconomic History   Marital status: Widowed    Spouse name: Not on file    Number of children: Not on file   Years of education: Not on file   Highest education level: Not on file  Occupational History   Not on file  Tobacco Use   Smoking status: Every Day    Packs/day: 0.75    Years: 49.00    Pack years: 36.75    Types: Cigarettes   Smokeless tobacco: Never  Vaping Use   Vaping Use: Never used  Substance and Sexual Activity   Alcohol use: No   Drug use: No    Comment: Methadone for narcotic use   Sexual activity: Not Currently  Other Topics Concern   Not on file  Social History Narrative   Not on file   Social Determinants of Health   Financial Resource Strain: Not on file  Food Insecurity: Not on file  Transportation Needs: Not on file  Physical Activity: Not on file  Stress: Not on file  Social Connections: Not on file  Additional Social History:    Allergies:   Allergies  Allergen Reactions   Tylenol [Acetaminophen] Other (See Comments)    Inflammed Liver   Codeine Nausea And Vomiting   Nsaids Other (See Comments)    Stomach ulcers   Tolmetin Other (See Comments)    Stomach ulcers   Tramadol     GI UPSET    Labs:  Results for orders placed or performed during the hospital encounter of 09/28/20 (from the past 48 hour(s))  CBC with Differential     Status: None   Collection Time: 09/28/20  6:10 PM  Result Value Ref Range   WBC 9.7 4.0 - 10.5 K/uL   RBC 4.72 3.87 - 5.11 MIL/uL   Hemoglobin 13.6 12.0 - 15.0 g/dL   HCT 41.5 36.0 - 46.0 %   MCV 87.9 80.0 - 100.0 fL   MCH 28.8 26.0 - 34.0 pg   MCHC 32.8 30.0 - 36.0 g/dL   RDW 14.5 11.5 - 15.5 %   Platelets 242 150 - 400 K/uL   nRBC 0.0 0.0 - 0.2 %   Neutrophils Relative % 65 %   Neutro Abs 6.2 1.7 - 7.7 K/uL   Lymphocytes Relative 26 %   Lymphs Abs 2.6 0.7 - 4.0 K/uL   Monocytes Relative 8 %   Monocytes Absolute 0.8 0.1 - 1.0 K/uL   Eosinophils Relative 0 %   Eosinophils Absolute 0.0 0.0 - 0.5 K/uL   Basophils Relative 1 %   Basophils Absolute 0.1 0.0 - 0.1 K/uL    Immature Granulocytes 0 %   Abs Immature Granulocytes 0.02 0.00 - 0.07 K/uL    Comment: Performed at [facility], [address] [place], [place] [address]  Comprehensive metabolic panel     Status: Abnormal   Collection Time: 09/28/20  6:10 PM  Result Value Ref Range   Sodium 143 135 - 145 mmol/L   Potassium 3.6 3.5 - 5.1 mmol/L   Chloride 106 98 - 111 mmol/L   CO2 30 22 - 32 mmol/L   Glucose, Bld 110 (H) 70 - 99 mg/dL    Comment: Glucose reference range applies only to samples taken after fasting for at least 8 hours.   BUN 21 8 - 23 mg/dL   Creatinine, Ser 0.55 0.44 - 1.00 mg/dL   Calcium 9.8 8.9 - 10.3 mg/dL   Total Protein 6.8 6.5 - 8.1 g/dL   Albumin 4.3 3.5 - 5.0 g/dL   AST 26 15 - 41 U/L   ALT 20 0 - 44 U/L   Alkaline Phosphatase 75 38 - 126 U/L   Total Bilirubin 0.9 0.3 - 1.2 mg/dL   GFR, Estimated >60 >60 mL/min    Comment: (NOTE) Calculated using the CKD-EPI Creatinine Equation (2021)    Anion gap 7 5 - 15    Comment: Performed at [facility], [address] [place], [place] [address]  Ethanol     Status: None   Collection Time: 09/28/20  6:10 PM  Result Value Ref Range   Alcohol, Ethyl (B) <10 <10 mg/dL    Comment: (NOTE) Lowest detectable limit for serum alcohol is 10 mg/dL.  For medical purposes only. Performed at [facility], [place], [place] [address]   Salicylate level     Status: Abnormal   Collection Time: 09/28/20  6:10 PM  Result Value Ref Range   Salicylate Lvl <4.5 (L) 7.0 - 30.0 mg/dL    Comment: Performed at [facility], [address], [place] [address]  Acetaminophen level     Status: None   Collection Time: 09/28/20  6:10 PM  Result Value Ref Range   Acetaminophen (Tylenol), Serum 12 10 - 30 ug/mL    Comment: (NOTE) Therapeutic concentrations vary significantly. A range of 10-30 ug/mL  may be an effective concentration for many patients. However, some  are best treated  at concentrations outside of this range. Acetaminophen concentrations >150 ug/mL at 4 hours after ingestion  and >50 ug/mL at 12 hours after ingestion are often associated with  toxic reactions.  Performed at [facility], [address] [place], [place] [address]   Resp Panel by RT-PCR (Flu A&B, Covid) Nasopharyngeal Swab     Status: None   Collection Time: 09/28/20  9:16 PM   Specimen: Nasopharyngeal Swab; Nasopharyngeal(NP) swabs in vial transport medium  Result Value Ref Range   SARS Coronavirus 2 by RT PCR NEGATIVE NEGATIVE    Comment: (NOTE) SARS-CoV-2 target nucleic acids are NOT DETECTED.  The SARS-CoV-2 RNA is generally detectable in upper respiratory specimens during the acute phase of infection. The lowest concentration of SARS-CoV-2 viral copies this assay can detect is 138 copies/mL. A negative result does not preclude SARS-Cov-2 infection and should not be used as the sole basis for treatment or other patient management decisions. A negative result may occur with  improper specimen collection/handling, submission of specimen other than nasopharyngeal swab, presence of viral mutation(s) within the areas targeted by this assay, and inadequate number of viral copies(<138 copies/mL). A negative result must be combined with clinical observations, patient history, and epidemiological information. The expected result is Negative.  Fact Sheet for Patients:  EntrepreneurPulse.com.au  Fact Sheet for Healthcare Providers:  IncredibleEmployment.be  This test is no t yet approved or cleared by the [place] FDA and  has been authorized for detection and/or diagnosis of SARS-CoV-2 by FDA under an Emergency Use Authorization (EUA). This EUA will remain  in effect (meaning this test can be used) for the duration of the COVID-19 declaration under Section 564(b)(1) of the Act, 21 U.S.C.section 360bbb-3(b)(1), unless the  authorization is terminated  or revoked sooner.       Influenza A by PCR NEGATIVE NEGATIVE   Influenza B by PCR NEGATIVE NEGATIVE    Comment: (NOTE) The Xpert Xpress SARS-CoV-2/FLU/RSV plus assay is intended as an aid in the diagnosis of influenza from Nasopharyngeal swab specimens and should not be used as a sole basis for treatment. Nasal washings and aspirates are unacceptable for Xpert Xpress SARS-CoV-2/FLU/RSV testing.  Fact Sheet for Patients: EntrepreneurPulse.com.au  Fact Sheet for Healthcare Providers: IncredibleEmployment.be  This test is not yet approved or cleared by the [place] FDA and has been authorized for detection and/or diagnosis of SARS-CoV-2 by FDA under an Emergency Use Authorization (EUA). This EUA will remain in effect (meaning this test can be used) for the duration of the COVID-19 declaration under Section 564(b)(1) of the Act, 21 U.S.C. section 360bbb-3(b)(1), unless the authorization is terminated or revoked.  Performed at [facility], [address] [place], [place] [address]     Current Facility-Administered Medications  Medication Dose Route Frequency Provider Last Rate Last Admin   [START ON 09/29/2020] carvedilol (COREG) tablet 6.25 mg  6.25 mg Oral BID WC Blake Divine, MD       OLANZapine zydis (ZYPREXA) disintegrating tablet 10 mg  10 mg Oral QHS Caroline Sauger, NP       temazepam (  RESTORIL) capsule 22.5 mg  22.5 mg Oral QHS Caroline Sauger, NP       Current Outpatient Medications  Medication Sig Dispense Refill   albuterol (VENTOLIN HFA) 108 (90 Base) MCG/ACT inhaler Inhale 2 puffs into the lungs every 6 (six) hours as needed for wheezing or shortness of breath. (Patient not taking: Reported on 08/01/2020) 8 g 0   benzonatate (TESSALON) 100 MG capsule Take 1 capsule (100 mg total) by mouth 3 (three) times daily. 20 capsule 0   buprenorphine (SUBUTEX) 2 MG SUBL SL tablet  Place 6 mg under the tongue daily.     carvedilol (COREG) 6.25 MG tablet Take 6.25 mg by mouth 2 (two) times daily with a meal.  6   ciprofloxacin (CILOXAN) 0.3 % ophthalmic solution Place 1 drop into the right eye every 4 (four) hours while awake. Administer 1 drop, every 2 hours, while awake, for 2 days. Then 1 drop, every 4 hours, while awake, for the next 5 days. 5 mL 0   diazepam (VALIUM) 2 MG tablet Take 2 mg by mouth 4 (four) times daily as needed for anxiety.     esomeprazole (NEXIUM) 40 MG capsule Take 40 mg by mouth daily at 12 noon.     ipratropium-albuterol (DUONEB) 0.5-2.5 (3) MG/3ML SOLN Take 3 mLs by nebulization every 6 (six) hours as needed. 360 mL 1   LORazepam (ATIVAN) 0.5 MG tablet Take 0.5 mg by mouth 2 (two) times daily as needed for anxiety.     mometasone-formoterol (DULERA) 100-5 MCG/ACT AERO Inhale 2 puffs into the lungs 2 (two) times daily. (Patient not taking: Reported on 08/01/2020) 1 each 0   OLANZapine zydis (ZYPREXA) 10 MG disintegrating tablet Take 1 tablet (10 mg total) by mouth at bedtime for 10 days. 10 tablet 0   polyethylene glycol (MIRALAX) packet Take 17 g by mouth daily. (Patient not taking: No sig reported) 14 each 0   predniSONE (DELTASONE) 20 MG tablet Take 3 tablets once daily for 4 days followed by 2 tablets once daily for 4 days followed by 1 tablet once daily for 4 days and then stop 24 tablet 0   scopolamine (TRANSDERM-SCOP) 1 MG/3DAYS Place 1 patch (1.5 mg total) onto the skin every 3 (three) days. (Patient not taking: No sig reported) 10 patch 0   simethicone (MYLICON) 976 MG chewable tablet Chew 125 mg by mouth every 6 (six) hours as needed for flatulence.     temazepam (RESTORIL) 22.5 MG capsule Take 22.5 mg by mouth at bedtime.  0    Musculoskeletal: Strength & Muscle Tone: within normal limits Gait & Station: normal Patient leans: N/A  Psychiatric Specialty Exam:  Presentation  General Appearance: Appropriate for Environment  Eye  Contact:Minimal  Speech:Clear and Coherent  Speech Volume:Decreased  Handedness:Right   Mood and Affect  Mood:Depressed; Anxious  Affect:Blunt; Congruent; Tearful   Thought Process  Thought Processes:Coherent  Descriptions of Associations:Circumstantial  Orientation:Full (Time, Place and Person)  Thought Content:Logical  History of Schizophrenia/Schizoaffective disorder:No  Duration of Psychotic Symptoms:Less than six months  Hallucinations:Hallucinations: Auditory; Visual Description of Auditory Hallucinations: "I hear Jesus voice." Description of Visual Hallucinations: Seeing demonds  Ideas of Reference:Delusions  Suicidal Thoughts:Suicidal Thoughts: No  Homicidal Thoughts:Homicidal Thoughts: No   Sensorium  Memory:Immediate Fair; Recent Fair; Remote Fair  Judgment:Poor  Insight:Poor   Executive Functions  Concentration:Fair  Attention Span:Good  [place]  Psychomotor Activity  Psychomotor Activity:Psychomotor Activity: Normal  Assets  Assets:Communication Skills; Desire  for Improvement; Resilience; Social Support; Physical Health  Sleep  Sleep:Sleep: Fair Number of Hours of Sleep: 6  Physical Exam: Physical Exam Vitals and nursing note reviewed.  Constitutional:      Appearance: Normal appearance.  HENT:     Head: Normocephalic and atraumatic.     Nose: Nose normal.  Eyes:     General:        Left eye: Discharge present. Cardiovascular:     Rate and Rhythm: Normal rate.     Pulses: Normal pulses.  Pulmonary:     Effort: Pulmonary effort is normal.  Musculoskeletal:        General: Normal range of motion.     Cervical back: Normal range of motion and neck supple.  Neurological:     General: No focal deficit present.     Mental Status: She is alert and oriented to person, place, and time.  Psychiatric:        Attention and Perception: Attention and perception normal.        Mood and  Affect: Mood is anxious and depressed.        Speech: Speech normal.        Behavior: Behavior normal. Behavior is cooperative.        Thought Content: Thought content is delusional.        Cognition and Memory: Cognition and memory normal.        Judgment: Judgment is impulsive and inappropriate.   Review of Systems  Psychiatric/Behavioral:  Positive for depression and hallucinations. The patient is nervous/anxious.   All other systems reviewed and are negative. Blood pressure (!) 154/85, pulse 73, temperature 98.4 F (36.9 C), temperature source Oral, resp. rate 16, height 5\' 4"  (1.626 m), weight 44.9 kg, SpO2 96 %. Body mass index is 16.99 kg/m.  Treatment Plan Summary: Daily contact with patient to assess and evaluate symptoms and progress in treatment, Medication management, and Plan The patient remained under observation overnight and will be reassessed in the a.m. to determine if she meets the criteria for psychiatric inpatient admission; she could be discharged home.  The patient was prescribe Zyprexa 10 mg to assist with the psychosis.  Disposition: Supportive therapy provided about ongoing stressors.  The patient remained under observation overnight and will be reassessed in the a.m. to determine if she meets the criteria for psychiatric inpatient admission; she could be discharged home.  Caroline Sauger, NP 09/28/2020 10:46 PM

## 2020-09-28 NOTE — ED Triage Notes (Signed)
Pt in via EMS from home with c/o confusion this am. Per EMS pt family reports it was a period of confusion that happened this am and happens sometimes intermittently. Pt family reports concern for the intermittent confusion for a while.

## 2020-09-28 NOTE — ED Triage Notes (Signed)
Patient states " My son sent me because I go outside and wonder off and get lost. I also talk to jesus"

## 2020-09-28 NOTE — ED Provider Notes (Signed)
[facility] Emergency Department Provider Note   ____________________________________________   Event Date/Time   First MD Initiated Contact with Patient 09/28/20 1755     (approximate)  I have reviewed the triage vital signs and the nursing notes.   HISTORY  Chief Complaint Altered Mental Status and Hallucinations    HPI [name] is a 70 y.o. female with past medical history of hypertension, COPD, stroke, chronic pain syndrome, cervical dystonia, prolonged QT, and seizures who presents to the ED for altered mental status.  Patient states that she will often wander and spends much of the day walking back and forth.  She states that she got lost today while she was wandering and "talking to Jesus."  She states that she is assessed nice things to her and she does not have any suicidal or homicidal ideation.  Speaking with her son over the phone, patient had wandered off multiple houses away and could not figure out how to get home.  He states that she has required psychiatric admission in the past but has not been on psychiatric medication in multiple months.  She had seemed to be doing well up until about a week ago, but since then she has had a gradual decline with increasing hallucinations and anxiety.  Patient denies any medical complaints at this time, states she feels well.  She denies any alcohol or drug abuse.        Past Medical History:  Diagnosis Date   Acute blood loss anemia    Arrhythmia    Arthritis    C. difficile colitis    Cancer ([place])    Diverticulosis    Fibromyalgia    GERD (gastroesophageal reflux disease)    GI hemorrhage    H/O: substance abuse ([place]) 2005   narcotic usage due to chronic back pain   Heart murmur    Hypertension    Pancreatic cyst    Pneumococcal meningitis    PONV (postoperative nausea and vomiting)    Renal cyst    Stroke (cerebrum) [facility])     Patient Active Problem List   Diagnosis Date Noted    Protein-calorie malnutrition, severe 08/03/2020   COPD with exacerbation ([place]) 08/02/2020   Acute respiratory failure with hypoxia ([place]) 08/01/2020   MDD (major depressive disorder), recurrent, severe, with psychosis ([place]) 07/03/2020   Hallucinations    Shortness of breath 04/20/2020   Chronic diastolic CHF (congestive heart failure) ([place]) 04/20/2020   Nausea & vomiting 04/20/2020   Prolonged QT interval 04/20/2020   COPD with acute exacerbation ([place]) 06/20/2017   DOE (dyspnea on exertion) 06/19/2017   Cervical dystonia 11/27/2016   C. difficile colitis 09/10/2016   Abdominal pain 09/08/2016   Acute encephalopathy    Fever    Left hemiparesis (HCC)    Generalized OA    Fibromyalgia    Tobacco abuse    Substance abuse ([place])    Benign essential HTN    Tachycardia    Chronic pain syndrome    Acute blood loss anemia    Leukocytosis    Septic thrombophlebitis of sagittal sinus    Acute respiratory failure (HCC)    Streptococcus pneumoniae meningitis    Streptococcal bacteremia    Meningitis    History of stroke 04/12/2016   Stroke (cerebrum) ([place]) 04/12/2016   Neck pain 04/04/2016   Chronic abdominal pain 02/19/2016   Essential hypertension 02/19/2016   Bacterial conjunctivitis 02/19/2016   Health care maintenance 02/19/2016   Anxiety 05/08/2006  Cigarette smoker 05/08/2006   ASTHMA, UNSPECIFIED 05/08/2006   GERD (gastroesophageal reflux disease) 05/08/2006   CONSTIPATION 05/08/2006   CONVULSIONS, SEIZURES, NOS 05/08/2006    Past Surgical History:  Procedure Laterality Date   ABDOMINAL HYSTERECTOMY     ABDOMINAL SURGERY     APPENDECTOMY     BIOPSY  04/22/2020   Procedure: BIOPSY;  Surgeon: Milus Banister, MD;  Location: [facility];  Service: Gastroenterology;;   BREAST SURGERY     CHOLECYSTECTOMY N/A 09/30/2012   Procedure: LAPAROSCOPIC CHOLECYSTECTOMY WITH INTRAOPERATIVE CHOLANGIOGRAM;  Surgeon: Imogene Burn. Georgette Dover, MD;  Location: [facility];  Service: General;   Laterality: N/A;   ESOPHAGOGASTRODUODENOSCOPY (EGD) WITH PROPOFOL N/A 04/22/2020   Procedure: ESOPHAGOGASTRODUODENOSCOPY (EGD) WITH PROPOFOL;  Surgeon: Milus Banister, MD;  Location: [facility];  Service: Gastroenterology;  Laterality: N/A;   FRACTURE SURGERY     right upper arm   OVARIAN CYST SURGERY      Prior to Admission medications   Medication Sig Start Date End Date Taking? Authorizing Provider  albuterol (VENTOLIN HFA) 108 (90 Base) MCG/ACT inhaler Inhale 2 puffs into the lungs every 6 (six) hours as needed for wheezing or shortness of breath. Patient not taking: Reported on 08/01/2020 04/22/20   Modena Jansky, MD  benzonatate (TESSALON) 100 MG capsule Take 1 capsule (100 mg total) by mouth 3 (three) times daily. 08/06/20   Bonnielee Haff, MD  buprenorphine (SUBUTEX) 2 MG SUBL SL tablet Place 6 mg under the tongue daily. 04/15/20   [provider]  carvedilol (COREG) 6.25 MG tablet Take 6.25 mg by mouth 2 (two) times daily with a meal. 12/25/17   [provider]  ciprofloxacin (CILOXAN) 0.3 % ophthalmic solution Place 1 drop into the right eye every 4 (four) hours while awake. Administer 1 drop, every 2 hours, while awake, for 2 days. Then 1 drop, every 4 hours, while awake, for the next 5 days. 08/06/20   Bonnielee Haff, MD  diazepam (VALIUM) 2 MG tablet Take 2 mg by mouth 4 (four) times daily as needed for anxiety. 04/02/20   [provider]  esomeprazole (NEXIUM) 40 MG capsule Take 40 mg by mouth daily at 12 noon.    [provider]  ipratropium-albuterol (DUONEB) 0.5-2.5 (3) MG/3ML SOLN Take 3 mLs by nebulization every 6 (six) hours as needed. 08/06/20   Bonnielee Haff, MD  mometasone-formoterol [facility]) 100-5 MCG/ACT AERO Inhale 2 puffs into the lungs 2 (two) times daily. Patient not taking: Reported on 08/01/2020 04/22/20   Modena Jansky, MD  OLANZapine zydis (ZYPREXA) 10 MG disintegrating tablet Take 1 tablet (10 mg total) by mouth at  bedtime for 10 days. 07/03/20 07/13/20  Luna Fuse, MD  polyethylene glycol [facility]) packet Take 17 g by mouth daily. Patient not taking: No sig reported 01/04/18   Isla Pence, MD  predniSONE (DELTASONE) 20 MG tablet Take 3 tablets once daily for 4 days followed by 2 tablets once daily for 4 days followed by 1 tablet once daily for 4 days and then stop 08/06/20   Bonnielee Haff, MD  scopolamine (TRANSDERM-SCOP) 1 MG/3DAYS Place 1 patch (1.5 mg total) onto the skin every 3 (three) days. Patient not taking: No sig reported 04/24/20   Modena Jansky, MD  simethicone (MYLICON) 371 MG chewable tablet Chew 125 mg by mouth every 6 (six) hours as needed for flatulence.    [provider]  temazepam (RESTORIL) 22.5 MG capsule Take 22.5 mg by mouth at bedtime. 12/02/17  [provider]    Allergies Tylenol [acetaminophen], Codeine, Nsaids, Tolmetin, and Tramadol  Family History  Problem Relation Age of Onset   Alzheimer's disease Mother    Heart disease Father    Prostate cancer Father     Social History Social History   Tobacco Use   Smoking status: Every Day    Packs/day: 0.75    Years: 49.00    Pack years: 36.75    Types: Cigarettes   Smokeless tobacco: Never  Vaping Use   Vaping Use: Never used  Substance Use Topics   Alcohol use: No   Drug use: No    Comment: Methadone for narcotic use    Review of Systems  Constitutional: No fever/chills Eyes: No visual changes. ENT: No sore throat. Cardiovascular: Denies chest pain. Respiratory: Denies shortness of breath. Gastrointestinal: No abdominal pain.  No nausea, no vomiting.  No diarrhea.  No constipation. Genitourinary: Negative for dysuria. Musculoskeletal: Negative for back pain. Skin: Negative for rash. Neurological: Negative for headaches, focal weakness or numbness.  Positive for auditory hallucinations.  ____________________________________________   PHYSICAL EXAM:  VITAL SIGNS: ED  Triage Vitals  Enc Vitals Group     BP      Pulse      Resp      Temp      Temp src      SpO2      Weight      Height      Head Circumference      Peak Flow      Pain Score      Pain Loc      Pain Edu?      Excl. in [place]?     Constitutional: Alert and oriented. Eyes: Conjunctivae are normal. Head: Atraumatic. Nose: No congestion/rhinnorhea. Mouth/Throat: Mucous membranes are moist. Neck: Normal ROM Cardiovascular: Normal rate, regular rhythm. Grossly normal heart sounds. Respiratory: Normal respiratory effort.  No retractions. Lungs CTAB. Gastrointestinal: Soft and nontender. No distention. Genitourinary: deferred Musculoskeletal: No lower extremity tenderness nor edema. Neurologic:  Normal speech and language. No gross focal neurologic deficits are appreciated. Skin:  Skin is warm, dry and intact. No rash noted. Psychiatric: Mood and affect are normal. Speech and behavior are normal.  ____________________________________________   LABS (all labs ordered are listed, but only abnormal results are displayed)  Labs Reviewed  COMPREHENSIVE METABOLIC PANEL - Abnormal; Notable for the following components:      Result Value   Glucose, Bld 110 (*)    All other components within normal limits  SALICYLATE LEVEL - Abnormal; Notable for the following components:   Salicylate Lvl <3.3 (*)    All other components within normal limits  RESP PANEL BY RT-PCR (FLU A&B, COVID) ARPGX2  CBC WITH DIFFERENTIAL/PLATELET  ETHANOL  ACETAMINOPHEN LEVEL  URINALYSIS, COMPLETE (UACMP) WITH MICROSCOPIC  URINE DRUG SCREEN, QUALITATIVE ([facility] ONLY)   ____________________________________________  EKG  ED ECG REPORT I, Blake Divine, the attending physician, personally viewed and interpreted this ECG.   Date: 09/28/2020  EKG Time: 17:58  Rate: 71  Rhythm: normal sinus rhythm  Axis: Normal  Intervals:none  ST&T Change: None   PROCEDURES  Procedure(s) performed (including Critical  Care):  Procedures   ____________________________________________   INITIAL IMPRESSION / ASSESSMENT AND PLAN / ED COURSE      70 year old female with past medical history of hypertension, COPD, stroke, cervical dystonia, seizure, and chronic pain syndrome who presents to the ED for 1 week of worsening auditory hallucinations along with  wandering.  Patient wandered today to the point that she got lost and continues to report auditory hallucinations, however denies suicidal or homicidal ideation.  We will perform medical work-up including CT head and labs for potential medical cause to her hallucinations.  She does have a history of hallucinations in the past and is not currently on antipsychotic medications per son.  If medical work-up is unremarkable, plan to consult psychiatry for further evaluation.  There is no indication for IVC at this time.  CT head is negative for acute process, labs are also unremarkable.  UA is pending but I doubt UTI is contributing to her auditory hallucinations.  Patient may be medically cleared for psychiatric evaluation.  The patient has been placed in psychiatric observation due to the need to provide a safe environment for the patient while obtaining psychiatric consultation and evaluation, as well as ongoing medical and medication management to treat the patient's condition.  The patient has not been placed under full IVC at this time.       ____________________________________________   FINAL CLINICAL IMPRESSION(S) / ED DIAGNOSES  Final diagnoses:  Hallucinations     ED Discharge Orders     None        Note:  This document was prepared using Dragon voice recognition software and may include unintentional dictation errors.    Blake Divine, MD 09/28/20 2021

## 2020-09-28 NOTE — BH Assessment (Signed)
Comprehensive Clinical Assessment (CCA) Note  09/28/2020 [name] [id number]  Chief Complaint: Patient is a 70 year old female presenting to [facility] ED voluntarily due to altered mental status. Per triage note Pt in via EMS from home with c/o confusion this am. Per EMS pt family reports it was a period of confusion that happened this am and happens sometimes intermittently. Pt family reports concern for the intermittent confusion for a while. During assessment patient appears alert and oriented x4, calm and cooperative. Patient reports "I've been a little confused but I'm better now, I was walking away from home and I was getting lost." Patient reports that she currently lives with her son and daughter in law with her grandchildren. Patient reports that she has been experiencing AH but reports that "I was just talking to Jesus" and reports that she hears Jesus talking back. Patient reports last having AH "a few weeks ago" she denies anything currently. Patient reports that he sleep is good when she is taking her medications but reports a poor appetite lately. Patient does report that she was hospitalized "6 months" for [facility]. Patient denies current SI/HI/AH/VH and does not appear to be responding to any internal or external stimuli.  Collateral information was obtained from patient's son [name] [id number] who reports that the patient had been hospitalized in March at [place]. Patient's son reports "she started getting worse after that and she thought she was seeing demons in the room so we took her to [facility] let her go and she was doing a lot better until here recently, she will walk down the street and will be talking to herself."  Per Psyc NP [name] patient to be observed overnight and reassessed  Chief Complaint  Patient presents with   Altered Mental Status   Hallucinations   Visit Diagnosis: Major Depressive Disorder, with psychosis by hx    CCA Screening,  Triage and Referral (STR)  Patient Reported Information How did you hear about [place]? Family/Friend  Referral name: No data recorded Referral phone number: No data recorded  Whom do you see for routine medical problems? I don't have a doctor  Practice/Facility Name: No data recorded Practice/Facility Phone Number: No data recorded Name of Contact: No data recorded Contact Number: No data recorded Contact Fax Number: No data recorded Prescriber Name: No data recorded Prescriber Address (if known): No data recorded  What Is the Reason for Your Visit/Call Today? Patient presents to the ED voluntarily due to altered mental status  How Long Has This Been Causing You Problems? > than 6 months  What Do You Feel Would Help You the Most Today? Treatment for Depression or other mood problem   Have You Recently Been in Any Inpatient Treatment (Hospital/Detox/Crisis Center/28-Day Program)? Yes  Name/Location of Program/Hospital:[place]  How Long Were You There? UTA  When Were You Discharged? No data recorded  Have You Ever Received Services From [facility] Before? No  Who Do You See at [facility]? No data recorded  Have You Recently Had Any Thoughts About Hurting Yourself? No  Are You Planning to Commit Suicide/Harm Yourself At This time? No   Have you Recently Had Thoughts About [place]? No  Explanation: No data recorded  Have You Used Any Alcohol or Drugs in the Past 24 Hours? No  How Long Ago Did You Use Drugs or Alcohol? No data recorded What Did You Use and How Much? No data recorded  Do You Currently Have a Therapist/Psychiatrist?  No  Name of Therapist/Psychiatrist: No data recorded  Have You Been Recently Discharged From Any Office Practice or Programs? No  Explanation of Discharge From Practice/Program: No data recorded    CCA Screening Triage Referral Assessment Type of Contact: Face-to-Face  Is this Initial or Reassessment? Initial  Assessment  Date Telepsych consult ordered in [facility]:  06/30/20  Time Telepsych consult ordered in [facility]:  0634   Patient Reported Information Reviewed? No data recorded Patient Left Without Being Seen? No data recorded Reason for Not Completing Assessment: No data recorded  Collateral Involvement: [name]  son  [phone]   Does Patient Have a Court Appointed Legal Guardian? No data recorded Name and Contact of Legal Guardian: No data recorded If Minor and Not Living with Parent(s), Who has Custody? No data recorded Is CPS involved or ever been involved? Never  Is APS involved or ever been involved? Never   Patient Determined To Be At Risk for Harm To Self or Others Based on Review of Patient Reported Information or Presenting Complaint? No  Method: No data recorded Availability of Means: No data recorded Intent: No data recorded Notification Required: No data recorded Additional Information for Danger to Others Potential: No data recorded Additional Comments for Danger to Others Potential: No data recorded Are There Guns or Other Weapons in Your Home? No data recorded Types of Guns/Weapons: No data recorded Are These Weapons Safely Secured?                            No data recorded Who Could Verify You Are Able To Have These Secured: No data recorded Do You Have any Outstanding Charges, Pending Court Dates, Parole/Probation? No data recorded Contacted To Inform of Risk of Harm To Self or Others: No data recorded  Location of Assessment: [facility] ED   Does Patient Present under Involuntary Commitment? No  IVC Papers Initial File Date: No data recorded  [place] of Residence: [place]   Patient Currently Receiving the Following Services: Not Receiving Services   Determination of Need: Emergent (2 hours)   Options For Referral: Medication Management; Outpatient Therapy     CCA Biopsychosocial Intake/Chief Complaint:  Pt BIBA from home for a psych eval per  son. Son is stating patient is having hallucinations, wandering around the house and saying the devil is coming to get her. Pt has complaints of head pain and HTN, BP 156/102. Pt recently dc'd from a psych facility in [place]. Pt calm and cooperative in triage. Denies SI/HI. GCS 15.  Current Symptoms/Problems: bad headache   Patient Reported Schizophrenia/Schizoaffective Diagnosis in Past: No   Strengths: Patient is able to communicate  Preferences: No data recorded Abilities: No data recorded  Type of Services Patient Feels are Needed: No data recorded  Initial Clinical Notes/Concerns: No data recorded  Mental Health Symptoms Depression:   Fatigue   Duration of Depressive symptoms:  Greater than two weeks   Mania:   None   Anxiety:    Worrying   Psychosis:   Hallucinations   Duration of Psychotic symptoms:  Less than six months   Trauma:   None   Obsessions:   None   Compulsions:   None   Inattention:   None   Hyperactivity/Impulsivity:   None   Oppositional/Defiant Behaviors:   None   Emotional Irregularity:   None   Other Mood/Personality Symptoms:  No data recorded   Mental Status Exam Appearance and self-care  Stature:  Average   Weight:   Average weight   Clothing:   Casual   Grooming:   Normal   Cosmetic use:   None   Posture/gait:   Normal   Motor activity:   Not Remarkable   Sensorium  Attention:   Normal   Concentration:   Normal   Orientation:   X5   Recall/memory:   Normal   Affect and Mood  Affect:   Appropriate   Mood:   Other (Comment)   Relating  Eye contact:   Normal   Facial expression:   Responsive   Attitude toward examiner:   Cooperative   Thought and Language  Speech flow:  Clear and Coherent   Thought content:   Appropriate to Mood and Circumstances   Preoccupation:   None   Hallucinations:   None   Organization:  No data recorded  [employer] of  Knowledge:   Fair   Intelligence:   Average   Abstraction:   Normal   Judgement:   Fair   Reality Testing:   Distorted   Insight:   Flashes of insight; Lacking   Decision Making:   Impulsive   Social Functioning  Social Maturity:   Responsible   Social Judgement:   Normal   Stress  Stressors:   Other (Comment)   Coping Ability:   Normal   Skill Deficits:   None   Supports:   Family     Religion: Religion/Spirituality Are You A Religious Person?: No  Leisure/Recreation: Leisure / Recreation Do You Have Hobbies?: No  Exercise/Diet: Exercise/Diet Do You Exercise?: No Have You Gained or Lost A Significant Amount of Weight in the Past Six Months?: No Do You Follow a Special Diet?: No Do You Have Any Trouble Sleeping?: No   CCA Employment/Education Employment/Work Situation: Employment / Work Situation Employment Situation: On disability Why is Patient on Disability: Medical reasons How Long has Patient Been on Disability: Unknown Patient's Job has Been Impacted by Current Illness: No Has Patient ever Been in the [employer]?: No  Education: Education Is Patient Currently Attending School?: No Did You Have An Individualized Education Program (IIEP): No Did You Have Any Difficulty At School?: No Patient's Education Has Been Impacted by Current Illness: No   CCA Family/Childhood History Family and Relationship History: Family history Marital status: Widowed Widowed, when?: Unknown Does patient have children?: Yes How many children?: 1 How is patient's relationship with their children?: Patient has a good relationship with her son  Childhood History:  Childhood History Did patient suffer any verbal/emotional/physical/sexual abuse as a child?: No Did patient suffer from severe childhood neglect?: No Has patient ever been sexually abused/assaulted/raped as an adolescent or adult?: No Was the patient ever a victim of a crime or a disaster?:  No Witnessed domestic violence?: No Has patient been affected by domestic violence as an adult?: No  Child/Adolescent Assessment:     CCA Substance Use Alcohol/Drug Use: Alcohol / Drug Use Pain Medications: See MAR Prescriptions: See MAR Over the Counter: See MAR History of alcohol / drug use?: No history of alcohol / drug abuse                         ASAM's:  Six Dimensions of Multidimensional Assessment  Dimension 1:  Acute Intoxication and/or Withdrawal Potential:      Dimension 2:  Biomedical Conditions and Complications:      Dimension 3:  Emotional, Behavioral, or Cognitive Conditions  and Complications:     Dimension 4:  Readiness to Change:     Dimension 5:  Relapse, Continued use, or Continued Problem Potential:     Dimension 6:  Recovery/Living Environment:     ASAM Severity Score:    ASAM Recommended Level of Treatment:     Substance use Disorder (SUD)    Recommendations for Services/Supports/Treatments:  Reassess  DSM5 Diagnoses: Patient Active Problem List   Diagnosis Date Noted   Protein-calorie malnutrition, severe 08/03/2020   COPD with exacerbation ([place]) 08/02/2020   Acute respiratory failure with hypoxia ([place]) 08/01/2020   MDD (major depressive disorder), recurrent, severe, with psychosis ([place]) 07/03/2020   Hallucinations    Shortness of breath 04/20/2020   Chronic diastolic CHF (congestive heart failure) ([place]) 04/20/2020   Nausea & vomiting 04/20/2020   Prolonged QT interval 04/20/2020   COPD with acute exacerbation ([place]) 06/20/2017   DOE (dyspnea on exertion) 06/19/2017   Cervical dystonia 11/27/2016   C. difficile colitis 09/10/2016   Abdominal pain 09/08/2016   Acute encephalopathy    Fever    Left hemiparesis (HCC)    Generalized OA    Fibromyalgia    Tobacco abuse    Substance abuse ([place])    Benign essential HTN    Tachycardia    Chronic pain syndrome    Acute blood loss anemia    Leukocytosis    Septic  thrombophlebitis of sagittal sinus    Acute respiratory failure (HCC)    Streptococcus pneumoniae meningitis    Streptococcal bacteremia    Meningitis    History of stroke 04/12/2016   Stroke (cerebrum) ([place]) 04/12/2016   Neck pain 04/04/2016   Chronic abdominal pain 02/19/2016   Essential hypertension 02/19/2016   Bacterial conjunctivitis 02/19/2016   Health care maintenance 02/19/2016   Anxiety 05/08/2006   Cigarette smoker 05/08/2006   ASTHMA, UNSPECIFIED 05/08/2006   GERD (gastroesophageal reflux disease) 05/08/2006   CONSTIPATION 05/08/2006   CONVULSIONS, SEIZURES, NOS 05/08/2006    Patient Centered Plan: Patient is on the following Treatment Plan(s):  Depression   Referrals to Alternative Service(s): Referred to Alternative Service(s):   Place:   Date:   Time:    Referred to Alternative Service(s):   Place:   Date:   Time:    Referred to Alternative Service(s):   Place:   Date:   Time:    Referred to Alternative Service(s):   Place:   Date:   Time:     Tallon Gertz A Jolynne Spurgin, LCAS-A

## 2020-09-29 DIAGNOSIS — F333 Major depressive disorder, recurrent, severe with psychotic symptoms: Secondary | ICD-10-CM | POA: Diagnosis not present

## 2020-09-29 NOTE — ED Notes (Signed)
Patient visitor [name], who states he is patients husband came to visit patient. Patient became upset with dispo plan of placement and stated she would like to leave with [name]. This RN notified md goodman and md clappacs due to patient being voluntary. This RN contacted patients son to update on situation. Per son patient could leave with terry but would not be able to return to his home if she left the hospital with him. Patient expressed desire to leave with [name], husband. MD clappacs verbalized that if son was agreeable patient was able to leave due to voluntary status. Patient belongings bag 1/1 returned to patient and patient escorted by this RN to Terry's car. E-signature not working at this time. Pt verbalized understanding of D/C instructions, prescriptions and follow up care with no further questions at this time. Pt in NAD and ambulatory at time of D/C.

## 2020-09-29 NOTE — Consult Note (Signed)
[facility] Face-to-Face Psychiatry Consult   Reason for Consult: Consult for 70 year old woman brought into the hospital after wandering away from home with confusion Referring Physician: Joni Fears Patient Identification: [name] MRN:  [id number] Principal Diagnosis: Severe recurrent major depression with psychotic features ([place]) Diagnosis:  Principal Problem:   Severe recurrent major depression with psychotic features ([place]) Active Problems:   Anxiety   Stroke (cerebrum) (HCC)   Chronic pain syndrome   COPD with acute exacerbation ([place])   Chronic diastolic CHF (congestive heart failure) ([place])   Total Time spent with patient: 1 hour  Subjective:   [name] is a 70 y.o. female patient admitted with "I got lost".  HPI: Patient seen chart reviewed.  Also spoke with the patient's son.  31 year old woman who has a past history of depression with psychosis.  Yesterday was walking around her son's house and wandered off the property and got completely lost.  Daughter-in-law had to go after her to find her.  Patient endorses ongoing auditory hallucinations.  For the last 2 weeks Jesus has been talking with her and she talks back.  She also says that her mood has been sad much of the time.  She has frequent crying spells sometimes throughout the day.  Sleep is adequate when taking her powerful sleeping medicine but her appetite is poor and she has lost weight.  She denies any suicidal ideation.  Son reports that her baseline functioning is impaired but recently she has been responding to voices and has seemed more confused and disorganized.  No substance abuse identified.  Patient had not currently been taking psychiatric medication at home although she had recently had a hospitalization and been prescribed olanzapine.  Past Psychiatric History: Patient has a recent psychiatric hospitalization in April at [facility] for psychotic depression with similar symptoms.  Lengthy hospitalization  finally stabilized with olanzapine and citalopram.  Attempts to get outpatient treatment were stymied by difficulty finding a provider so medication lapsed.  Son also reports that years ago the patient had an episode of meningitis.  Very severe illness.  Since that time he says she has mentally not been the same as she was before.  Does not have a earlier life history of bipolar disorder or severe depression.  Does not have schizophrenia.  Risk to Self:   Risk to Others:   Prior Inpatient Therapy:   Prior Outpatient Therapy:    Past Medical History:  Past Medical History:  Diagnosis Date   Acute blood loss anemia    Arrhythmia    Arthritis    C. difficile colitis    Cancer ([place])    Diverticulosis    Fibromyalgia    GERD (gastroesophageal reflux disease)    GI hemorrhage    H/O: substance abuse ([place]) 2005   narcotic usage due to chronic back pain   Heart murmur    Hypertension    Pancreatic cyst    Pneumococcal meningitis    PONV (postoperative nausea and vomiting)    Renal cyst    Stroke (cerebrum) [facility])     Past Surgical History:  Procedure Laterality Date   ABDOMINAL HYSTERECTOMY     ABDOMINAL SURGERY     APPENDECTOMY     BIOPSY  04/22/2020   Procedure: BIOPSY;  Surgeon: Milus Banister, MD;  Location: [facility];  Service: Gastroenterology;;   BREAST SURGERY     CHOLECYSTECTOMY N/A 09/30/2012   Procedure: LAPAROSCOPIC CHOLECYSTECTOMY WITH INTRAOPERATIVE CHOLANGIOGRAM;  Surgeon: Imogene Burn. Georgette Dover, MD;  Location: [facility]  ORS;  Service: General;  Laterality: N/A;   ESOPHAGOGASTRODUODENOSCOPY (EGD) WITH PROPOFOL N/A 04/22/2020   Procedure: ESOPHAGOGASTRODUODENOSCOPY (EGD) WITH PROPOFOL;  Surgeon: Milus Banister, MD;  Location: [facility];  Service: Gastroenterology;  Laterality: N/A;   FRACTURE SURGERY     right upper arm   OVARIAN CYST SURGERY     Family History:  Family History  Problem Relation Age of Onset   Alzheimer's disease Mother    Heart disease Father     Prostate cancer Father    Family Psychiatric  History: Family history of 2 close relatives with Alzheimer's disease although both of them are older than the patient. Social History:  Social History   Substance and Sexual Activity  Alcohol Use No     Social History   Substance and Sexual Activity  Drug Use No   Comment: Methadone for narcotic use    Social History   Socioeconomic History   Marital status: Widowed    Spouse name: Not on file   Number of children: Not on file   Years of education: Not on file   Highest education level: Not on file  Occupational History   Not on file  Tobacco Use   Smoking status: Every Day    Packs/day: 0.75    Years: 49.00    Pack years: 36.75    Types: Cigarettes   Smokeless tobacco: Never  Vaping Use   Vaping Use: Never used  Substance and Sexual Activity   Alcohol use: No   Drug use: No    Comment: Methadone for narcotic use   Sexual activity: Not Currently  Other Topics Concern   Not on file  Social History Narrative   Not on file   Social Determinants of Health   Financial Resource Strain: Not on file  Food Insecurity: Not on file  Transportation Needs: Not on file  Physical Activity: Not on file  Stress: Not on file  Social Connections: Not on file   Additional Social History:    Allergies:   Allergies  Allergen Reactions   Tylenol [Acetaminophen] Other (See Comments)    Inflammed Liver   Codeine Nausea And Vomiting   Nsaids Other (See Comments)    Stomach ulcers   Tolmetin Other (See Comments)    Stomach ulcers   Tramadol     GI UPSET    Labs:  Results for orders placed or performed during the hospital encounter of 09/28/20 (from the past 48 hour(s))  CBC with Differential     Status: None   Collection Time: 09/28/20  6:10 PM  Result Value Ref Range   WBC 9.7 4.0 - 10.5 K/uL   RBC 4.72 3.87 - 5.11 MIL/uL   Hemoglobin 13.6 12.0 - 15.0 g/dL   HCT 41.5 36.0 - 46.0 %   MCV 87.9 80.0 - 100.0 fL   MCH  28.8 26.0 - 34.0 pg   MCHC 32.8 30.0 - 36.0 g/dL   RDW 14.5 11.5 - 15.5 %   Platelets 242 150 - 400 K/uL   nRBC 0.0 0.0 - 0.2 %   Neutrophils Relative % 65 %   Neutro Abs 6.2 1.7 - 7.7 K/uL   Lymphocytes Relative 26 %   Lymphs Abs 2.6 0.7 - 4.0 K/uL   Monocytes Relative 8 %   Monocytes Absolute 0.8 0.1 - 1.0 K/uL   Eosinophils Relative 0 %   Eosinophils Absolute 0.0 0.0 - 0.5 K/uL   Basophils Relative 1 %   Basophils Absolute 0.1  0.0 - 0.1 K/uL   Immature Granulocytes 0 %   Abs Immature Granulocytes 0.02 0.00 - 0.07 K/uL    Comment: Performed at [facility], [address] [place], [place] [address]  Comprehensive metabolic panel     Status: Abnormal   Collection Time: 09/28/20  6:10 PM  Result Value Ref Range   Sodium 143 135 - 145 mmol/L   Potassium 3.6 3.5 - 5.1 mmol/L   Chloride 106 98 - 111 mmol/L   CO2 30 22 - 32 mmol/L   Glucose, Bld 110 (H) 70 - 99 mg/dL    Comment: Glucose reference range applies only to samples taken after fasting for at least 8 hours.   BUN 21 8 - 23 mg/dL   Creatinine, Ser 0.55 0.44 - 1.00 mg/dL   Calcium 9.8 8.9 - 10.3 mg/dL   Total Protein 6.8 6.5 - 8.1 g/dL   Albumin 4.3 3.5 - 5.0 g/dL   AST 26 15 - 41 U/L   ALT 20 0 - 44 U/L   Alkaline Phosphatase 75 38 - 126 U/L   Total Bilirubin 0.9 0.3 - 1.2 mg/dL   GFR, Estimated >60 >60 mL/min    Comment: (NOTE) Calculated using the CKD-EPI Creatinine Equation (2021)    Anion gap 7 5 - 15    Comment: Performed at [facility], [address] [place], [place] [address]  Ethanol     Status: None   Collection Time: 09/28/20  6:10 PM  Result Value Ref Range   Alcohol, Ethyl (B) <10 <10 mg/dL    Comment: (NOTE) Lowest detectable limit for serum alcohol is 10 mg/dL.  For medical purposes only. Performed at [facility], [address] [place], [place] [id number]   Salicylate level     Status: Abnormal   Collection Time: 09/28/20  6:10 PM  Result Value Ref Range    Salicylate Lvl [id number] (L) 7.0 - 30.0 mg/dL    Comment: Performed at [facility], [address] [place], [place] [address]  Acetaminophen level     Status: None   Collection Time: 09/28/20  6:10 PM  Result Value Ref Range   Acetaminophen (Tylenol), Serum 12 10 - 30 ug/mL    Comment: (NOTE) Therapeutic concentrations vary significantly. A range of 10-30 ug/mL  may be an effective concentration for many patients. However, some  are best treated at concentrations outside of this range. Acetaminophen concentrations >150 ug/mL at 4 hours after ingestion  and >50 ug/mL at 12 hours after ingestion are often associated with  toxic reactions.  Performed at [facility], [address] [place], [place] [address]   Resp Panel by RT-PCR (Flu A&B, Covid) Nasopharyngeal Swab     Status: None   Collection Time: 09/28/20  9:16 PM   Specimen: Nasopharyngeal Swab; Nasopharyngeal(NP) swabs in vial transport medium  Result Value Ref Range   SARS Coronavirus 2 by RT PCR NEGATIVE NEGATIVE    Comment: (NOTE) SARS-CoV-2 target nucleic acids are NOT DETECTED.  The SARS-CoV-2 RNA is generally detectable in upper respiratory specimens during the acute phase of infection. The lowest concentration of SARS-CoV-2 viral copies this assay can detect is 138 copies/mL. A negative result does not preclude SARS-Cov-2 infection and should not be used as the sole basis for treatment or other patient management decisions. A negative result may occur with  improper specimen collection/handling, submission of specimen other than nasopharyngeal swab, presence of viral mutation(s) within the areas targeted by this  assay, and inadequate number of viral copies(<138 copies/mL). A negative result must be combined with clinical observations, patient history, and epidemiological information. The expected result is Negative.  Fact Sheet for Patients:  EntrepreneurPulse.com.au  Fact Sheet  for Healthcare Providers:  IncredibleEmployment.be  This test is no t yet approved or cleared by the [place] FDA and  has been authorized for detection and/or diagnosis of SARS-CoV-2 by FDA under an Emergency Use Authorization (EUA). This EUA will remain  in effect (meaning this test can be used) for the duration of the COVID-19 declaration under Section 564(b)(1) of the Act, 21 U.S.C.section 360bbb-3(b)(1), unless the authorization is terminated  or revoked sooner.       Influenza A by PCR NEGATIVE NEGATIVE   Influenza B by PCR NEGATIVE NEGATIVE    Comment: (NOTE) The Xpert Xpress SARS-CoV-2/FLU/RSV plus assay is intended as an aid in the diagnosis of influenza from Nasopharyngeal swab specimens and should not be used as a sole basis for treatment. Nasal washings and aspirates are unacceptable for Xpert Xpress SARS-CoV-2/FLU/RSV testing.  Fact Sheet for Patients: EntrepreneurPulse.com.au  Fact Sheet for Healthcare Providers: IncredibleEmployment.be  This test is not yet approved or cleared by the [place] FDA and has been authorized for detection and/or diagnosis of SARS-CoV-2 by FDA under an Emergency Use Authorization (EUA). This EUA will remain in effect (meaning this test can be used) for the duration of the COVID-19 declaration under Section 564(b)(1) of the Act, 21 U.S.C. section 360bbb-3(b)(1), unless the authorization is terminated or revoked.  Performed at [facility] [address] [place], [place] [address]     Current Facility-Administered Medications  Medication Dose Route Frequency Provider Last Rate Last Admin   carvedilol (COREG) tablet 6.25 mg  6.25 mg Oral BID WC Blake Divine, MD   6.25 mg at 09/29/20 1015   OLANZapine zydis (ZYPREXA) disintegrating tablet 10 mg  10 mg Oral QHS Caroline Sauger, NP   10 mg at 09/29/20 [id number]   temazepam (RESTORIL) capsule 22.5 mg  22.5 mg  Oral QHS Caroline Sauger, NP   22.5 mg at 09/29/20 0214   Current Outpatient Medications  Medication Sig Dispense Refill   buprenorphine (SUBUTEX) 2 MG SUBL SL tablet Place 6 mg under the tongue daily.     carvedilol (COREG) 6.25 MG tablet Take 6.25 mg by mouth at bedtime.  6   esomeprazole (NEXIUM) 40 MG capsule Take 40 mg by mouth daily at 12 noon.     LORazepam (ATIVAN) 0.5 MG tablet Take 0.5 mg by mouth 2 (two) times daily as needed for anxiety.     simethicone (MYLICON) 0000000 MG chewable tablet Chew 125 mg by mouth every 6 (six) hours as needed for flatulence.     albuterol (VENTOLIN HFA) 108 (90 Base) MCG/ACT inhaler Inhale 2 puffs into the lungs every 6 (six) hours as needed for wheezing or shortness of breath. (Patient not taking: No sig reported) 8 g 0   benzonatate (TESSALON) 100 MG capsule Take 1 capsule (100 mg total) by mouth 3 (three) times daily. (Patient not taking: Reported on 09/29/2020) 20 capsule 0   ciprofloxacin (CILOXAN) 0.3 % ophthalmic solution Place 1 drop into the right eye every 4 (four) hours while awake. Administer 1 drop, every 2 hours, while awake, for 2 days. Then 1 drop, every 4 hours, while awake, for the next 5 days. (Patient not taking: Reported on 09/29/2020) 5 mL 0   diazepam (VALIUM) 2 MG tablet Take 2 mg by  mouth 4 (four) times daily as needed for anxiety. (Patient not taking: Reported on 09/29/2020)     ipratropium-albuterol (DUONEB) 0.5-2.5 (3) MG/3ML SOLN Take 3 mLs by nebulization every 6 (six) hours as needed. (Patient not taking: Reported on 09/29/2020) 360 mL 1   mometasone-formoterol (DULERA) 100-5 MCG/ACT AERO Inhale 2 puffs into the lungs 2 (two) times daily. (Patient not taking: No sig reported) 1 each 0   OLANZapine zydis (ZYPREXA) 10 MG disintegrating tablet Take 1 tablet (10 mg total) by mouth at bedtime for 10 days. 10 tablet 0   polyethylene glycol (MIRALAX) packet Take 17 g by mouth daily. (Patient not taking: No sig reported) 14 each 0    predniSONE (DELTASONE) 20 MG tablet Take 3 tablets once daily for 4 days followed by 2 tablets once daily for 4 days followed by 1 tablet once daily for 4 days and then stop (Patient not taking: Reported on 09/29/2020) 24 tablet 0   scopolamine (TRANSDERM-SCOP) 1 MG/3DAYS Place 1 patch (1.5 mg total) onto the skin every 3 (three) days. (Patient not taking: No sig reported) 10 patch 0   temazepam (RESTORIL) 22.5 MG capsule Take 22.5 mg by mouth at bedtime.  0    Musculoskeletal: Strength & Muscle Tone: decreased Gait & Station: normal Patient leans: N/A            Psychiatric Specialty Exam:  Presentation  General Appearance: Appropriate for Environment  Eye Contact:Minimal  Speech:Clear and Coherent  Speech Volume:Decreased  Handedness:Right   Mood and Affect  Mood:Depressed; Anxious  Affect:Blunt; Congruent; Tearful   Thought Process  Thought Processes:Coherent  Descriptions of Associations:Circumstantial  Orientation:Full (Time, Place and Person)  Thought Content:Logical  History of Schizophrenia/Schizoaffective disorder:No  Duration of Psychotic Symptoms:Less than six months  Hallucinations:Hallucinations: Auditory; Visual Description of Auditory Hallucinations: "I hear Jesus voice." Description of Visual Hallucinations: Seeing demonds  Ideas of Reference:Delusions  Suicidal Thoughts:Suicidal Thoughts: No  Homicidal Thoughts:Homicidal Thoughts: No   Sensorium  Memory:Immediate Fair; Recent Fair; Remote Fair  Judgment:Poor  Insight:Poor   Executive Functions  Concentration:Fair  Attention Span:Good  [place]   Psychomotor Activity  Psychomotor Activity:Psychomotor Activity: Normal   Assets  Assets:Communication Skills; Desire for Improvement; Resilience; Social Support; Physical Health   Sleep  Sleep:Sleep: Fair Number of Hours of Sleep: 6   Physical Exam: Physical Exam Vitals  and nursing note reviewed.  HENT:     Head: Normocephalic and atraumatic.     Mouth/Throat:     Pharynx: Oropharynx is clear.  Eyes:     Pupils: Pupils are equal, round, and reactive to light.  Cardiovascular:     Rate and Rhythm: Normal rate and regular rhythm.  Pulmonary:     Effort: Pulmonary effort is normal.     Breath sounds: Normal breath sounds.  Abdominal:     General: Abdomen is flat.     Palpations: Abdomen is soft.  Musculoskeletal:        General: Normal range of motion.  Skin:    General: Skin is warm and dry.  Neurological:     General: No focal deficit present.     Mental Status: She is alert. Mental status is at baseline.  Psychiatric:        Attention and Perception: She is inattentive. She perceives auditory hallucinations.        Mood and Affect: Mood is depressed. Affect is blunt.        Speech: Speech is delayed.  Behavior: Behavior is slowed.        Thought Content: Thought content is paranoid. Thought content does not include homicidal or suicidal ideation.        Cognition and Memory: Cognition is impaired. Memory is impaired.        Judgment: Judgment is impulsive.   Review of Systems  Constitutional:  Positive for weight loss.  HENT: Negative.    Eyes: Negative.   Respiratory: Negative.    Cardiovascular: Negative.   Gastrointestinal: Negative.   Musculoskeletal: Negative.   Skin: Negative.   Neurological: Negative.   Psychiatric/Behavioral:  Positive for depression, hallucinations and memory loss. Negative for suicidal ideas. The patient is nervous/anxious and has insomnia.   Blood pressure 128/65, pulse 70, temperature 98.4 F (36.9 C), temperature source Oral, resp. rate 16, height '5\' 4"'$  (1.626 m), weight 44.9 kg, SpO2 99 %. Body mass index is 16.99 kg/m.  Treatment Plan Summary: Medication management and Plan neatly groomed but very thin woman.  Cooperative but decreased eye contact slow speech frequently looked confused in her  affect.  Having auditory hallucinations and disorganized behavior.  Son describes the behavior as being even more persistent and concerning than what the patient is saying.  No indication of any suicidal ideation or intent but son has valid concerns about her safety at home because of her tendency to wander.  After discussing options with the son we agree that the preferred option would be geriatric psychiatry hospitalization.  Plan will be to continue current dose of olanzapine and I have asked TTS to refer patient to geriatric psychiatry units.  At this time she remains voluntary.  If no hospital bed appears to be forthcoming son states he will consider taking her back home rather than leaving her in the emergency room for a long time although he is appropriately concerned for her safety.  Disposition: Recommend psychiatric Inpatient admission when medically cleared. Supportive therapy provided about ongoing stressors.  [name], MD 09/29/2020 2:36 PM

## 2020-09-29 NOTE — ED Provider Notes (Signed)
Emergency Medicine Observation Re-evaluation Note  Mao Hamon is a 70 y.o. female, seen on rounds today.  Pt initially presented to the ED for complaints of Altered Mental Status and Hallucinations Currently, the patient is resting.  Physical Exam  BP (!) 107/59   Pulse (!) 51   Temp 98.4 F (36.9 C) (Oral)   Resp 15   Ht 1.626 m ('5\' 4"'$ )   Wt 44.9 kg   SpO2 98%   BMI 16.99 kg/m  Physical Exam Gen:  No acute distress Resp:  Breathing easily and comfortably, no accessory muscle usage Neuro:  Moving all four extremities, no gross focal neuro deficits Psych:  Resting currently, calm when awake  ED Course / MDM  EKG:   I have reviewed the labs performed to date as well as medications administered while in observation.  Recent changes in the last 24 hours include initial evaluation by EDP with medical clearance, followed by psychiatric evaluation.  Plan  Current plan is for psychiatric reassessment this morning.  Latrise Sagraves is not under involuntary commitment.     Hinda Kehr, MD 09/29/20 [phone]

## 2020-09-29 NOTE — Discharge Instructions (Addendum)
Please seek medical attention for any high fevers, chest pain, shortness of breath, change in behavior, persistent vomiting, bloody stool or any other new or concerning symptoms.  

## 2020-09-29 NOTE — BH Assessment (Signed)
Referral information for Psychiatric Hospitalization faxed to;   [facility] [id number]- [phone]),   [facility] [phone] -or- [phone])  [facility] [phone]),  [place] [phone], [phone], [phone] or [phone]),   [facility] [phone] or [phone])  [facility] [phone]),   [facility] [phone] -or- [phone]),   [facility] [phone] or [phone]),   [facility] [phone]).  [facility] [phone])

## 2020-10-06 ENCOUNTER — Other Ambulatory Visit: Payer: Self-pay

## 2020-10-06 ENCOUNTER — Encounter (HOSPITAL_COMMUNITY): Payer: Self-pay

## 2020-10-06 ENCOUNTER — Emergency Department (HOSPITAL_COMMUNITY)
Admission: EM | Admit: 2020-10-06 | Discharge: 2020-10-06 | Disposition: A | Discharge status: Home / Self Care | Payer: Medicare Other | Attending: Emergency Medicine | Admitting: Emergency Medicine

## 2020-10-06 ENCOUNTER — Emergency Department (HOSPITAL_COMMUNITY): Payer: Medicare Other

## 2020-10-06 DIAGNOSIS — R479 Unspecified speech disturbances: Secondary | ICD-10-CM | POA: Diagnosis not present

## 2020-10-06 DIAGNOSIS — J45909 Unspecified asthma, uncomplicated: Secondary | ICD-10-CM | POA: Insufficient documentation

## 2020-10-06 DIAGNOSIS — I5032 Chronic diastolic (congestive) heart failure: Secondary | ICD-10-CM | POA: Diagnosis not present

## 2020-10-06 DIAGNOSIS — R410 Disorientation, unspecified: Secondary | ICD-10-CM | POA: Insufficient documentation

## 2020-10-06 DIAGNOSIS — I110 Hypertensive heart disease with heart failure: Secondary | ICD-10-CM | POA: Insufficient documentation

## 2020-10-06 DIAGNOSIS — J449 Chronic obstructive pulmonary disease, unspecified: Secondary | ICD-10-CM | POA: Insufficient documentation

## 2020-10-06 DIAGNOSIS — F17210 Nicotine dependence, cigarettes, uncomplicated: Secondary | ICD-10-CM | POA: Diagnosis not present

## 2020-10-06 DIAGNOSIS — Z79899 Other long term (current) drug therapy: Secondary | ICD-10-CM | POA: Insufficient documentation

## 2020-10-06 DIAGNOSIS — R1084 Generalized abdominal pain: Secondary | ICD-10-CM

## 2020-10-06 DIAGNOSIS — Z7951 Long term (current) use of inhaled steroids: Secondary | ICD-10-CM | POA: Diagnosis not present

## 2020-10-06 DIAGNOSIS — K219 Gastro-esophageal reflux disease without esophagitis: Secondary | ICD-10-CM | POA: Insufficient documentation

## 2020-10-06 DIAGNOSIS — R197 Diarrhea, unspecified: Secondary | ICD-10-CM | POA: Insufficient documentation

## 2020-10-06 DIAGNOSIS — R630 Anorexia: Secondary | ICD-10-CM | POA: Diagnosis not present

## 2020-10-06 DIAGNOSIS — Z859 Personal history of malignant neoplasm, unspecified: Secondary | ICD-10-CM | POA: Diagnosis not present

## 2020-10-06 LAB — URINALYSIS, ROUTINE W REFLEX MICROSCOPIC
Bilirubin Urine: NEGATIVE
Glucose, UA: NEGATIVE mg/dL
Ketones, ur: 20 mg/dL — AB
Leukocytes,Ua: NEGATIVE
Nitrite: NEGATIVE
Protein, ur: NEGATIVE mg/dL
Specific Gravity, Urine: 1.015 (ref 1.005–1.030)
pH: 6 (ref 5.0–8.0)

## 2020-10-06 LAB — LIPASE, BLOOD: Lipase: 30 U/L (ref 11–51)

## 2020-10-06 LAB — COMPREHENSIVE METABOLIC PANEL
ALT: 22 U/L (ref 0–44)
AST: 29 U/L (ref 15–41)
Albumin: 4.2 g/dL (ref 3.5–5.0)
Alkaline Phosphatase: 76 U/L (ref 38–126)
Anion gap: 8 (ref 5–15)
BUN: 15 mg/dL (ref 8–23)
CO2: 30 mmol/L (ref 22–32)
Calcium: 10 mg/dL (ref 8.9–10.3)
Chloride: 102 mmol/L (ref 98–111)
Creatinine, Ser: 0.79 mg/dL (ref 0.44–1.00)
GFR, Estimated: >60 mL/min (ref >60)
Glucose, Bld: 142 mg/dL — ABNORMAL HIGH (ref 70–99)
Potassium: 3.4 mmol/L — ABNORMAL LOW (ref 3.5–5.1)
Sodium: 140 mmol/L (ref 135–145)
Total Bilirubin: 0.6 mg/dL (ref 0.3–1.2)
Total Protein: 6.8 g/dL (ref 6.5–8.1)

## 2020-10-06 LAB — CBC WITH DIFFERENTIAL/PLATELET
Abs Immature Granulocytes: 0.03 10*3/uL (ref 0.00–0.07)
Basophils Absolute: 0.1 10*3/uL (ref 0.0–0.1)
Basophils Relative: 1 %
Eosinophils Absolute: 0 10*3/uL (ref 0.0–0.5)
Eosinophils Relative: 0 %
HCT: 45.5 % (ref 36.0–46.0)
Hemoglobin: 14.8 g/dL (ref 12.0–15.0)
Immature Granulocytes: 0 %
Lymphocytes Relative: 16 %
Lymphs Abs: 1.7 10*3/uL (ref 0.7–4.0)
MCH: 28.7 pg (ref 26.0–34.0)
MCHC: 32.5 g/dL (ref 30.0–36.0)
MCV: 88.2 fL (ref 80.0–100.0)
Monocytes Absolute: 0.6 10*3/uL (ref 0.1–1.0)
Monocytes Relative: 6 %
Neutro Abs: 8.7 10*3/uL — ABNORMAL HIGH (ref 1.7–7.7)
Neutrophils Relative %: 77 %
Platelets: 296 10*3/uL (ref 150–400)
RBC: 5.16 MIL/uL — ABNORMAL HIGH (ref 3.87–5.11)
RDW: 14.2 % (ref 11.5–15.5)
WBC: 11.2 10*3/uL — ABNORMAL HIGH (ref 4.0–10.5)
nRBC: 0 % (ref 0.0–0.2)

## 2020-10-06 LAB — MAGNESIUM: Magnesium: 1.7 mg/dL (ref 1.7–2.4)

## 2020-10-06 MED ORDER — LORAZEPAM 2 MG/ML IJ SOLN
0.5000 mg | Freq: Once | INTRAMUSCULAR | Status: AC
  Administered 2020-10-06: 0.5 mg via INTRAVENOUS
  Filled 2020-10-06: qty 1

## 2020-10-06 MED ORDER — LORAZEPAM 1 MG PO TABS
0.5000 mg | ORAL_TABLET | Freq: Once | ORAL | Status: AC
  Administered 2020-10-06: 0.5 mg via ORAL
  Filled 2020-10-06: qty 1

## 2020-10-06 MED ORDER — SODIUM CHLORIDE 0.9 % IV BOLUS
1000.0000 mL | Freq: Once | INTRAVENOUS | Status: AC
  Administered 2020-10-06: 1000 mL via INTRAVENOUS

## 2020-10-06 NOTE — ED Notes (Signed)
Patient given a snack bag.

## 2020-10-06 NOTE — Discharge Instructions (Addendum)
Below is the contact information for neurology.  Please give them a call and schedule a follow-up appointment.  If she develops any new or worsening symptoms please bring her back to the emergency department.  It was a pleasure to meet you both.

## 2020-10-06 NOTE — ED Triage Notes (Signed)
Patient here with spouse who reports that patient continuing to have confusion and cognitive issues for the past few months. Spouse states not sleeping ad has complains of right sided abdominal pain with nausea and no intake x 4 days. Hx of pancreatitis. Patient slow to answer questions but alert

## 2020-10-06 NOTE — ED Provider Notes (Signed)
[facility] EMERGENCY DEPARTMENT Provider Note   CSN: [id number] Arrival date & time: 10/06/20  [id number]     History No chief complaint on file.   [name] is a 70 y.o. female.  HPI Patient is a 70 year old female with a history of cancer, substance abuse, hypertension, pancreatic cyst, stroke, CHF, severe recurrent major depression with psychotic features, COPD, who presents to the emergency department due to chronic abdominal pain.  Her husband states that patient has a history of chronic pancreatitis.  He notes that for the past 3 to 4 days she states her abdominal pain is worsened and she has been having decreased appetite due to this.  Patient denies any nausea, vomiting.  She does note some diarrhea but denies any hematochezia.  No chest pain or shortness of breath.  Her husband also complains of persistent cognitive issues/confusion.  States this is been ongoing for the past 3 to 4 months.  Reports difficulty sleeping and states that she will wake up and ambulate many times throughout the night.  He states that she has difficulty remaining alert and at times he will have to "call her name many times to get her attention".  He states that this is becoming a worsening issue and he is getting called out of work many times per week due to his wife's symptoms.  Per records, patient was evaluated at [facility] on July 21.  She was medically cleared and had a CT scan of her head which showed no acute abnormalities.  She was evaluated by psychiatry and recommended for placement but patient was voluntary and ultimately decided to leave the emergency department.  It appears that her son noted that patient had an episode of meningitis many years ago and has had cognitive issues ever since this occurred.  Patient had a prior psychiatric hospitalization in April at the [facility] for psychotic depression.  She was stabilized during a lengthy stay with olanzapine as  well as citalopram.    Past Medical History:  Diagnosis Date   Acute blood loss anemia    Arrhythmia    Arthritis    C. difficile colitis    Cancer ([place])    Diverticulosis    Fibromyalgia    GERD (gastroesophageal reflux disease)    GI hemorrhage    H/O: substance abuse ([place]) 2005   narcotic usage due to chronic back pain   Heart murmur    Hypertension    Pancreatic cyst    Pneumococcal meningitis    PONV (postoperative nausea and vomiting)    Renal cyst    Stroke (cerebrum) [facility])     Patient Active Problem List   Diagnosis Date Noted   Protein-calorie malnutrition, severe 08/03/2020   COPD with exacerbation ([place]) 08/02/2020   Acute respiratory failure with hypoxia ([employer]) 08/01/2020   Severe recurrent major depression with psychotic features ([place]) 07/03/2020   Hallucinations    Shortness of breath 04/20/2020   Chronic diastolic CHF (congestive heart failure) ([place]) 04/20/2020   Nausea & vomiting 04/20/2020   Prolonged QT interval 04/20/2020   COPD with acute exacerbation ([place]) 06/20/2017   DOE (dyspnea on exertion) 06/19/2017   Cervical dystonia 11/27/2016   C. difficile colitis 09/10/2016   Abdominal pain 09/08/2016   Acute encephalopathy    Fever    Left hemiparesis ([facility])    Generalized OA    Fibromyalgia    Tobacco abuse    Substance abuse ([place])    Benign essential HTN  Tachycardia    Chronic pain syndrome    Acute blood loss anemia    Leukocytosis    Septic thrombophlebitis of sagittal sinus    Acute respiratory failure ([facility])    Streptococcus pneumoniae meningitis    Streptococcal bacteremia    Meningitis    History of stroke 04/12/2016   Stroke (cerebrum) ([place]) 04/12/2016   Neck pain 04/04/2016   Chronic abdominal pain 02/19/2016   Essential hypertension 02/19/2016   Bacterial conjunctivitis 02/19/2016   Health care maintenance 02/19/2016   Anxiety 05/08/2006   Cigarette smoker 05/08/2006   ASTHMA, UNSPECIFIED 05/08/2006   GERD  (gastroesophageal reflux disease) 05/08/2006   CONSTIPATION 05/08/2006   CONVULSIONS, SEIZURES, NOS 05/08/2006    Past Surgical History:  Procedure Laterality Date   ABDOMINAL HYSTERECTOMY     ABDOMINAL SURGERY     APPENDECTOMY     BIOPSY  04/22/2020   Procedure: BIOPSY;  Surgeon: Milus Banister, MD;  Location: [facility];  Service: Gastroenterology;;   BREAST SURGERY     CHOLECYSTECTOMY N/A 09/30/2012   Procedure: LAPAROSCOPIC CHOLECYSTECTOMY WITH INTRAOPERATIVE CHOLANGIOGRAM;  Surgeon: Imogene Burn. Georgette Dover, MD;  Location: [facility];  Service: General;  Laterality: N/A;   ESOPHAGOGASTRODUODENOSCOPY (EGD) WITH PROPOFOL N/A 04/22/2020   Procedure: ESOPHAGOGASTRODUODENOSCOPY (EGD) WITH PROPOFOL;  Surgeon: Milus Banister, MD;  Location: [facility];  Service: Gastroenterology;  Laterality: N/A;   FRACTURE SURGERY     right upper arm   OVARIAN CYST SURGERY       OB History   No obstetric history on file.     Family History  Problem Relation Age of Onset   Alzheimer's disease Mother    Heart disease Father    Prostate cancer Father     Social History   Tobacco Use   Smoking status: Every Day    Packs/day: 0.75    Years: 49.00    Pack years: 36.75    Types: Cigarettes   Smokeless tobacco: Never  Vaping Use   Vaping Use: Never used  Substance Use Topics   Alcohol use: No   Drug use: No    Comment: Methadone for narcotic use    Home Medications Prior to Admission medications   Medication Sig Start Date End Date Taking? Authorizing Provider  buprenorphine (SUBUTEX) 2 MG SUBL SL tablet Place 4 mg under the tongue daily. LF on 09-15-20 # 60 DS 30 04/15/20  Yes [provider]  carvedilol (COREG) 6.25 MG tablet Take 6.25 mg by mouth 2 (two) times daily with a meal. 12/25/17  Yes [provider]  LORazepam (ATIVAN) 0.5 MG tablet Take 0.5 mg by mouth 2 (two) times daily as needed for anxiety. 09/15/20  Yes [provider]  simethicone (MYLICON) 0000000 MG  chewable tablet Chew 125 mg by mouth every 6 (six) hours as needed for flatulence.   Yes [provider]  temazepam (RESTORIL) 22.5 MG capsule Take 22.5 mg by mouth at bedtime. 12/02/17  Yes [provider]  albuterol (VENTOLIN HFA) 108 (90 Base) MCG/ACT inhaler Inhale 2 puffs into the lungs every 6 (six) hours as needed for wheezing or shortness of breath. Patient not taking: Reported on 10/06/2020 04/22/20   Modena Jansky, MD  benzonatate (TESSALON) 100 MG capsule Take 1 capsule (100 mg total) by mouth 3 (three) times daily. Patient not taking: No sig reported 08/06/20   Bonnielee Haff, MD  ciprofloxacin (CILOXAN) 0.3 % ophthalmic solution Place 1 drop into the right eye every 4 (four) hours while  awake. Administer 1 drop, every 2 hours, while awake, for 2 days. Then 1 drop, every 4 hours, while awake, for the next 5 days. Patient not taking: No sig reported 08/06/20   Bonnielee Haff, MD  ipratropium-albuterol (DUONEB) 0.5-2.5 (3) MG/3ML SOLN Take 3 mLs by nebulization every 6 (six) hours as needed. Patient not taking: No sig reported 08/06/20   Bonnielee Haff, MD  mometasone-formoterol [facility]) 100-5 MCG/ACT AERO Inhale 2 puffs into the lungs 2 (two) times daily. Patient not taking: No sig reported 04/22/20   Modena Jansky, MD  OLANZapine zydis (ZYPREXA) 10 MG disintegrating tablet Take 1 tablet (10 mg total) by mouth at bedtime for 10 days. 07/03/20 07/13/20  Luna Fuse, MD  polyethylene glycol [facility]) packet Take 17 g by mouth daily. Patient not taking: No sig reported 01/04/18   Isla Pence, MD  predniSONE (DELTASONE) 20 MG tablet Take 3 tablets once daily for 4 days followed by 2 tablets once daily for 4 days followed by 1 tablet once daily for 4 days and then stop Patient not taking: No sig reported 08/06/20   Bonnielee Haff, MD  scopolamine (TRANSDERM-SCOP) 1 MG/3DAYS Place 1 patch (1.5 mg total) onto the skin every 3 (three) days. Patient not taking: No sig  reported 04/24/20   Modena Jansky, MD    Allergies    Tylenol [acetaminophen], Codeine, Nsaids, Tolmetin, and Tramadol  Review of Systems   Review of Systems  All other systems reviewed and are negative. Ten systems reviewed and are negative for acute change, except as noted in the HPI.   Physical Exam Updated Vital Signs BP (!) 164/84 (BP Location: Right Arm)   Pulse 70   Temp 98.7 F (37.1 C) (Oral)   Resp 18   Ht '5\' 4"'$  (1.626 m)   Wt 44.9 kg   SpO2 97%   BMI 16.99 kg/m   Physical Exam Vitals and nursing note reviewed.  Constitutional:      General: She is not in acute distress.    Appearance: Normal appearance. She is not ill-appearing, toxic-appearing or diaphoretic.  HENT:     Head: Normocephalic and atraumatic.     Right Ear: External ear normal.     Left Ear: External ear normal.     Nose: Nose normal.     Mouth/Throat:     Mouth: Mucous membranes are moist.     Pharynx: Oropharynx is clear. No oropharyngeal exudate or posterior oropharyngeal erythema.  Eyes:     Extraocular Movements: Extraocular movements intact.  Cardiovascular:     Rate and Rhythm: Normal rate and regular rhythm.     Pulses: Normal pulses.     Heart sounds: Normal heart sounds. No murmur heard.   No friction rub. No gallop.  Pulmonary:     Effort: Pulmonary effort is normal. No respiratory distress.     Breath sounds: Normal breath sounds. No stridor. No wheezing, rhonchi or rales.  Abdominal:     General: Abdomen is flat.     Tenderness: There is no abdominal tenderness.  Musculoskeletal:        General: Normal range of motion.     Cervical back: Normal range of motion and neck supple. No tenderness.  Skin:    General: Skin is warm and dry.  Neurological:     General: No focal deficit present.     Mental Status: She is alert and oriented to person, place, and time.     Comments: A&O x3.  Moving  extremities without difficulty.  Ambulatory with a steady gait.  Psychiatric:         Mood and Affect: Mood is depressed. Affect is flat.        Speech: Speech is delayed.        Behavior: Behavior normal. Behavior is not agitated or aggressive.        Thought Content: Thought content does not include homicidal or suicidal ideation.   ED Results / Procedures / Treatments   Labs (all labs ordered are listed, but only abnormal results are displayed) Labs Reviewed  COMPREHENSIVE METABOLIC PANEL - Abnormal; Notable for the following components:      Result Value   Potassium 3.4 (*)    Glucose, Bld 142 (*)    All other components within normal limits  CBC WITH DIFFERENTIAL/PLATELET - Abnormal; Notable for the following components:   WBC 11.2 (*)    RBC 5.16 (*)    Neutro Abs 8.7 (*)    All other components within normal limits  URINALYSIS, ROUTINE W REFLEX MICROSCOPIC - Abnormal; Notable for the following components:   APPearance CLOUDY (*)    Hgb urine dipstick SMALL (*)    Ketones, ur 20 (*)    Bacteria, UA RARE (*)    All other components within normal limits  LIPASE, BLOOD  MAGNESIUM    EKG None  Radiology CT ABDOMEN PELVIS WO CONTRAST  Result Date: 10/06/2020 CLINICAL DATA:  Abdominal pain, confusion EXAM: CT ABDOMEN AND PELVIS WITHOUT CONTRAST TECHNIQUE: Multidetector CT imaging of the abdomen and pelvis was performed following the standard protocol without IV contrast. COMPARISON:  04/20/2020 FINDINGS: Lower chest: No pleural or pericardial effusion. Hepatobiliary: Persistent mild intrahepatic and extrahepatic biliary ductal dilatation post cholecystectomy. No new liver lesion is evident. Pancreas: Scattered pancreatic parenchymal calcifications without discrete lesion. Spleen: Normal in size without focal abnormality. Adrenals/Urinary Tract: Adrenal glands unremarkable. No hydronephrosis. 2 mm calcification in the lower pole left renal collecting system. Urinary bladder is nondistended. Stomach/Bowel: Stomach is nondistended. Small bowel decompressed. Post  appendectomy. The colon is nondilated, unremarkable. Vascular/Lymphatic: Coarse aortoiliac calcified plaque without aneurysm. No abdominal or pelvic adenopathy. Reproductive: Status post hysterectomy. No adnexal masses. Other: No ascites.  No free air. Musculoskeletal: Fusion across the L4-5 interspace. Mild degenerative disc disease L5-S1. Facet DJD L3-4. Mild bilateral hip DJD. No fracture or worrisome bone lesion. IMPRESSION: 1. No acute findings. 2. Stable biliary ductal dilatation post cholecystectomy. 3. Pancreatic parenchymal calcifications suggesting chronic pancreatitis. 4. Nonobstructive left nephrolithiasis. 5.  Aortic Atherosclerosis (ICD10-170.0). Electronically Signed   By: Lucrezia Europe M.D.   On: 10/06/2020 09:06    Procedures Procedures   Medications Ordered in ED Medications  LORazepam (ATIVAN) tablet 0.5 mg (has no administration in time range)  LORazepam (ATIVAN) injection 0.5 mg (0.5 mg Intravenous Given 10/06/20 0926)  sodium chloride 0.9 % bolus 1,000 mL (0 mLs Intravenous Stopped 10/06/20 1137)    ED Course  I have reviewed the triage vital signs and the nursing notes.  Pertinent labs & imaging results that were available during my care of the patient were reviewed by me and considered in my medical decision making (see chart for details).  Clinical Course as of 10/06/20 1516  Fri Oct 06, 2020  0823 Discussed behavioral health evaluation and patient's husband declines.  He states that she was evaluated 1 week ago and he does not want to repeat that process. [LJ]    Clinical Course User Index [LJ] Rayna Sexton, PA-C   MDM  Rules/Calculators/A&P                          Pt is a 70 y.o. female who presents to the emergency department with her husband due to her chronic abdominal pain, decreased p.o. intake, as well as febrile changes.  Labs: CBC with a white count of 11.2, RBCs of 5.16, neutrophils of 8.7. CMP with potassium of 3.4 and a glucose of 142. Magnesium  1.7. Lipase of 30. UA with small hemoglobin, 20 ketones, rare bacteria.  Imaging: CT scan of the abdomen and pelvis without contrast shows no acute findings.  I, Rayna Sexton, PA-C, personally reviewed and evaluated these images and lab results as part of my medical decision-making.  Abdominal pain likely chronic in nature.  Patient has a history of chronic pancreatitis that was confirmed with imaging today.  Lipase within normal limits.  Tolerating p.o. intake without difficulty.  Patient's husband also is concerned regarding patient's mental status.  She is currently A&O x3 but he states that at times she is difficult to speak to and he feels that she is more disoriented than normal.  She was evaluated in the emergency department 8 days ago with similar complaints and had a reassuring work-up including a CT scan of the head.  She was evaluated by behavioral health as a voluntary patient and was recommended for inpatient treatment but because she could not get a bed in town her husband took her home.  I offered to have her reexamined today by our behavioral health team but he declined.  He requested a neurology referral.  This was given to him.  Feel that patient is stable for discharge at this time and he is agreeable.  Discussed return precautions.  Recommended following up with neurology next week.  Their questions were answered and they were amicable at the time of discharge.  Note: Portions of this report may have been transcribed using voice recognition software. Every effort was made to ensure accuracy; however, inadvertent computerized transcription errors may be present.   Final Clinical Impression(s) / ED Diagnoses Final diagnoses:  Generalized abdominal pain   Rx / DC Orders ED Discharge Orders     None        Rayna Sexton, PA-C 10/06/20 1519    Luna Fuse, MD 10/18/20 2000

## 2020-10-08 ENCOUNTER — Other Ambulatory Visit: Payer: Self-pay

## 2020-10-08 ENCOUNTER — Ambulatory Visit (HOSPITAL_COMMUNITY)
Admission: EM | Admit: 2020-10-08 | Discharge: 2020-10-08 | Disposition: A | Discharge status: Home / Self Care | Payer: Medicare Other

## 2020-10-08 NOTE — ED Notes (Signed)
Patient left prior to triage. Pt's significant other stated that " they did not like the reviews" on the way out.

## 2020-10-08 NOTE — BH Assessment (Addendum)
Clinician introduced himself to patient and significant other.  Clinician informed patient that he needed to retrieve glasses.  On the way back to see patient, Seria, RN notified clinician that patient and significant other were leaving.  Significant other had told her that they "did not like the reviews."

## 2020-10-20 ENCOUNTER — Encounter (HOSPITAL_COMMUNITY): Payer: Self-pay | Admitting: *Deleted

## 2020-10-20 ENCOUNTER — Ambulatory Visit (HOSPITAL_COMMUNITY)
Admission: RE | Admit: 2020-10-20 | Discharge: 2020-10-20 | Disposition: A | Discharge status: Home / Self Care | Payer: Medicare Other | Attending: Psychiatry | Admitting: Psychiatry

## 2020-10-20 ENCOUNTER — Emergency Department (HOSPITAL_COMMUNITY): Payer: Medicare Other

## 2020-10-20 ENCOUNTER — Emergency Department (HOSPITAL_COMMUNITY)
Admission: EM | Admit: 2020-10-20 | Discharge: 2020-10-21 | Disposition: A | Discharge status: Home / Self Care | Payer: Medicare Other | Attending: Emergency Medicine | Admitting: Emergency Medicine

## 2020-10-20 ENCOUNTER — Other Ambulatory Visit: Payer: Self-pay

## 2020-10-20 DIAGNOSIS — Z79899 Other long term (current) drug therapy: Secondary | ICD-10-CM | POA: Insufficient documentation

## 2020-10-20 DIAGNOSIS — Z20822 Contact with and (suspected) exposure to covid-19: Secondary | ICD-10-CM | POA: Diagnosis not present

## 2020-10-20 DIAGNOSIS — Z85828 Personal history of other malignant neoplasm of skin: Secondary | ICD-10-CM | POA: Insufficient documentation

## 2020-10-20 DIAGNOSIS — I5032 Chronic diastolic (congestive) heart failure: Secondary | ICD-10-CM | POA: Diagnosis not present

## 2020-10-20 DIAGNOSIS — I110 Hypertensive heart disease with heart failure: Secondary | ICD-10-CM | POA: Insufficient documentation

## 2020-10-20 DIAGNOSIS — J449 Chronic obstructive pulmonary disease, unspecified: Secondary | ICD-10-CM | POA: Insufficient documentation

## 2020-10-20 DIAGNOSIS — J45909 Unspecified asthma, uncomplicated: Secondary | ICD-10-CM | POA: Insufficient documentation

## 2020-10-20 DIAGNOSIS — R072 Precordial pain: Secondary | ICD-10-CM | POA: Diagnosis present

## 2020-10-20 DIAGNOSIS — F17210 Nicotine dependence, cigarettes, uncomplicated: Secondary | ICD-10-CM | POA: Diagnosis not present

## 2020-10-20 LAB — RESP PANEL BY RT-PCR (FLU A&B, COVID) ARPGX2
Influenza A by PCR: NEGATIVE
Influenza B by PCR: NEGATIVE
SARS Coronavirus 2 by RT PCR: NEGATIVE

## 2020-10-20 LAB — COMPREHENSIVE METABOLIC PANEL
ALT: 18 U/L (ref 0–44)
AST: 19 U/L (ref 15–41)
Albumin: 3.9 g/dL (ref 3.5–5.0)
Alkaline Phosphatase: 77 U/L (ref 38–126)
Anion gap: 8 (ref 5–15)
BUN: 17 mg/dL (ref 8–23)
CO2: 28 mmol/L (ref 22–32)
Calcium: 9.4 mg/dL (ref 8.9–10.3)
Chloride: 104 mmol/L (ref 98–111)
Creatinine, Ser: 0.77 mg/dL (ref 0.44–1.00)
GFR, Estimated: >60 mL/min (ref >60)
Glucose, Bld: 113 mg/dL — ABNORMAL HIGH (ref 70–99)
Potassium: 3.1 mmol/L — ABNORMAL LOW (ref 3.5–5.1)
Sodium: 140 mmol/L (ref 135–145)
Total Bilirubin: 0.6 mg/dL (ref 0.3–1.2)
Total Protein: 6.4 g/dL — ABNORMAL LOW (ref 6.5–8.1)

## 2020-10-20 LAB — CBC WITH DIFFERENTIAL/PLATELET
Abs Immature Granulocytes: 0.04 10*3/uL (ref 0.00–0.07)
Basophils Absolute: 0.1 10*3/uL (ref 0.0–0.1)
Basophils Relative: 1 %
Eosinophils Absolute: 0.2 10*3/uL (ref 0.0–0.5)
Eosinophils Relative: 1 %
HCT: 41.2 % (ref 36.0–46.0)
Hemoglobin: 13.3 g/dL (ref 12.0–15.0)
Immature Granulocytes: 0 %
Lymphocytes Relative: 22 %
Lymphs Abs: 2.5 10*3/uL (ref 0.7–4.0)
MCH: 28.6 pg (ref 26.0–34.0)
MCHC: 32.3 g/dL (ref 30.0–36.0)
MCV: 88.6 fL (ref 80.0–100.0)
Monocytes Absolute: 0.8 10*3/uL (ref 0.1–1.0)
Monocytes Relative: 7 %
Neutro Abs: 7.7 10*3/uL (ref 1.7–7.7)
Neutrophils Relative %: 69 %
Platelets: 311 10*3/uL (ref 150–400)
RBC: 4.65 MIL/uL (ref 3.87–5.11)
RDW: 14 % (ref 11.5–15.5)
WBC: 11.2 10*3/uL — ABNORMAL HIGH (ref 4.0–10.5)
nRBC: 0 % (ref 0.0–0.2)

## 2020-10-20 LAB — TROPONIN I (HIGH SENSITIVITY): Troponin I (High Sensitivity): 6 ng/L (ref <18)

## 2020-10-20 LAB — LIPASE, BLOOD: Lipase: 39 U/L (ref 11–51)

## 2020-10-20 NOTE — BH Assessment (Addendum)
Comprehensive Clinical Assessment (CCA) Note  10/20/2020 [name] AM:[id number]  Disposition: Per Antionette Char, "Based on my evaluation the patient does not appear to have an emergency medical condition. Patient does not appear to be suicidal, homicidal, have any auditory or visual hallucinations, or any active psychosis at this time. Patient does not meet requirement for inpatient psychiatric admission; however does appear to have increased symptoms of cognitive decline that require further follow-up with specialist. Resources for [place],  [employer], and Adult Services and Programs provided to husband with plan to follow up; appointment set with PCP 10/24/20. [place]-SUICIDE SEVERITY RATING SCALE (C-SSRS), and patient scored, "No Risk".   The patient demonstrates the following risk factors for suicide: Chronic risk factors for suicide include: psychiatric disorder of Rule out Major Depressive, Disorder, Recurrent, Severe, without psychotic features, Anxiety Disorder, Dementia, Unspecified . Acute risk factors for suicide include:  displacement from home, change in mental status, social support needed, therapeutic support needed . Protective factors for this patient include: positive social support. Considering these factors, the overall suicide risk at this point appears to be "No Risk". Patient is appropriate for outpatient follow up.   [facility] from 10/06/2020 in [place] [facility] from 09/28/2020 in [place] [facility] to Hosp-Admission (Discharged) from 08/01/2020 in [place] (1C)  C-SSRS RISK CATEGORY No Risk No Risk No Risk       .  Chief Complaint:  Chief Complaint  Patient presents with   Psychiatric Evaluation   Visit Diagnosis: Major Depressive, Disorder, Recurrent, Severe, without psychotic features, Anxiety Disorder, Dementia,  Unspecified  [name] is an 70 y.o. female who presented voluntarily to [facility] with her husband for assessment of "possible dementia, auditory hallucinations, and unusual behavior".    Upon assessing patient she states, "I don't know why I am here, my husband wants me to be checked out". She was not able to provide a lot of insight into her reason for visiting today. Patient made very little eye contact with this Clinician. She then mumbled, "I guess it's because I talk to God". Patient admits that she hears God and speaks to him on a daily basis. She denies command hallucinations when she speaks to God.  Patient is oriented to person and place. She believes that it's 2021. However, indicates that it's August, provides her name, DOB, and has insight on where she is at the time of the assessment. Denies suicidal ideations, homicidal ideations, and AVH's. Denies history of suicide attempts and/or gestures. Denis history of self mutilating behaviors. Appetite is fair. However, she has experienced 7 pounds of weight loss in the past month. She sleeps no more than 7 hrs per day. Denies substance use history. However, spouse shares that patient used a lot of pain medications in the past leading to treatment at a [facility]. She is currently taking Suboxone. However, her spouse states that it's only used for pain management due to patient's hx of medical issues.    Collateral: [name] (spouse)  Per husband report patient is not sleeping and "hallucinating". He reports increased confusion and patient wandering off at times. She was living with her son for a period time and was found wandering in his neighbors yards throughout the night. Also, their was an occasion when she was wandering in the streets with altered mental status. Spouse states that the son could no longer take care of patient. Therefore, patient is now  living back with her spouse in a hotel room. She has been found wandering outside  when her spouse is at work. States that he has to leave his job to check on her. Their are times when he calls her hourly from his job to make sure that she is in the room, safe, and not wandering.  Patient denies allegations stating "I can't just stay locked up in a room all day. I have to move around". He denies any concerns that she will harm herself or anyone; states his primary concern is her declining health. States she has an appointment with her primary care 10/24/20 to follow-up on her increased abdominal pain and weight-loss 12 lbs/1 mos; mentioned concern for cancer. [facility] provider Marissa Alar, NP) notes:  Per chart history patient has extensive past medical history including pancreatic cyst, CVA, seizures, meningitis, chronic pain syndrome, and substance abuse.    CCA Screening, Triage and Referral (STR)  Patient Reported Information How did you hear about [place]? Family/Friend  What Is the Reason for Your Visit/Call Today? Patient presents to the [facility] with a complain of possible "dementia", hearing voices, and usual behaviors  How Long Has This Been Causing You Problems? > than 6 months  What Do You Feel Would Help You the Most Today? Treatment for Depression or other mood problem   Have You Recently Had Any Thoughts About Hurting Yourself? No  Are You Planning to Commit Suicide/Harm Yourself At This time? No   Have you Recently Had Thoughts About [place]? No  Are You Planning to Harm Someone at This Time? No  Explanation: No data recorded  Have You Used Any Alcohol or Drugs in the Past 24 Hours? No  How Long Ago Did You Use Drugs or Alcohol? No data recorded What Did You Use and How Much? No data recorded  Do You Currently Have a Therapist/Psychiatrist? No  Name of Therapist/Psychiatrist: No data recorded  Have You Been Recently Discharged From Any Office Practice or Programs? No  Explanation of Discharge From Practice/Program: No data  recorded    CCA Screening Triage Referral Assessment Type of Contact: Face-to-Face  Telemedicine Service Delivery:   Is this Initial or Reassessment? Initial Assessment  Date Telepsych consult ordered in [facility]:  06/30/20  Time Telepsych consult ordered in [facility]:  0634  Location of Assessment: [facility] ED  Provider Location: [facility]   Collateral Involvement: [name]  son  [phone]   Does Patient Have a Court Appointed Legal Guardian? No data recorded Name and Contact of Legal Guardian: No data recorded If Minor and Not Living with Parent(s), Who has Custody? No data recorded Is CPS involved or ever been involved? Never  Is APS involved or ever been involved? Never   Patient Determined To Be At Risk for Harm To Self or Others Based on Review of Patient Reported Information or Presenting Complaint? No  Method: No data recorded Availability of Means: No data recorded Intent: No data recorded Notification Required: No data recorded Additional Information for Danger to Others Potential: No data recorded Additional Comments for Danger to Others Potential: No data recorded Are There Guns or Other Weapons in Your Home? No data recorded Types of Guns/Weapons: No data recorded Are These Weapons Safely Secured?                            No data recorded Who Could Verify You Are Able To Have These Secured:  No data recorded Do You Have any Outstanding Charges, Pending Court Dates, Parole/Probation? No data recorded Contacted To Inform of Risk of Harm To Self or Others: No data recorded   Does Patient Present under Involuntary Commitment? No  IVC Papers Initial File Date: No data recorded  [place] of Residence: [place]   Patient Currently Receiving the Following Services: -- (Patient not receiving services.)   Determination of Need: Emergent (2 hours)   Options For Referral: Medication Management     CCA Biopsychosocial Patient Reported  Schizophrenia/Schizoaffective Diagnosis in Past: No   Strengths: Patient is able to communicate   Mental Health Symptoms Depression:   Fatigue   Duration of Depressive symptoms:  Duration of Depressive Symptoms: Greater than two weeks   Mania:   None   Anxiety:    Worrying   Psychosis:   Hallucinations   Duration of Psychotic symptoms:    Trauma:   None   Obsessions:   None   Compulsions:   None   Inattention:   None   Hyperactivity/Impulsivity:   None   Oppositional/Defiant Behaviors:   None   Emotional Irregularity:   None   Other Mood/Personality Symptoms:  No data recorded   Mental Status Exam Appearance and self-care  Stature:   Average   Weight:   Average weight   Clothing:   Casual   Grooming:   Normal   Cosmetic use:   None   Posture/gait:   Slumped   Motor activity:   Not Remarkable   Sensorium  Attention:   Normal   Concentration:   Normal   Orientation:   X5   Recall/memory:   Normal   Affect and Mood  Affect:   Appropriate   Mood:   Other (Comment)   Relating  Eye contact:   Normal   Facial expression:   Responsive   Attitude toward examiner:   Cooperative   Thought and Language  Speech flow:  Clear and Coherent   Thought content:   Appropriate to Mood and Circumstances   Preoccupation:   None   Hallucinations:   None   Organization:  No data recorded  [employer] of Knowledge:   Fair   Intelligence:   Average   Abstraction:   Normal   Judgement:   Fair   Reality Testing:   Distorted   Insight:   Flashes of insight; Lacking   Decision Making:   Impulsive   Social Functioning  Social Maturity:   Responsible   Social Judgement:   Normal   Stress  Stressors:   Other (Comment)   Coping Ability:   Normal   Skill Deficits:   None   Supports:   Family     Religion: Religion/Spirituality Are You A Religious Person?:  No  Leisure/Recreation: Leisure / Recreation Do You Have Hobbies?: No  Exercise/Diet: Exercise/Diet Do You Exercise?: No Have You Gained or Lost A Significant Amount of Weight in the Past Six Months?: No Do You Follow a Special Diet?: No Do You Have Any Trouble Sleeping?: No   CCA Employment/Education Employment/Work Situation: Employment / Work Situation Employment Situation: On disability Why is Patient on Disability: Medical reasons How Long has Patient Been on Disability: Unknown Patient's Job has Been Impacted by Current Illness: No Has Patient ever Been in the [employer]?: No  Education: Education Is Patient Currently Attending School?: No Did You Nutritional therapist?: No Did You Have An Individualized Education Program (IIEP): No Did You Have Any Difficulty At  School?: No Patient's Education Has Been Impacted by Current Illness: No   CCA Family/Childhood History Family and Relationship History: Family history Marital status: Married Widowed, when?: Unknown Does patient have children?: Yes How many children?: 1 How is patient's relationship with their children?: Patient has a good relationship with her son  Childhood History:  Childhood History By whom was/is the patient raised?:  (unknown) Did patient suffer any verbal/emotional/physical/sexual abuse as a child?: No Did patient suffer from severe childhood neglect?: No Has patient ever been sexually abused/assaulted/raped as an adolescent or adult?: No Was the patient ever a victim of a crime or a disaster?: No Witnessed domestic violence?: No Has patient been affected by domestic violence as an adult?: No  Child/Adolescent Assessment:     CCA Substance Use Alcohol/Drug Use: Alcohol / Drug Use Pain Medications: See MAR Prescriptions: See MAR Over the Counter: See MAR History of alcohol / drug use?: No history of alcohol / drug abuse                         ASAM's:  Six Dimensions of  Multidimensional Assessment  Dimension 1:  Acute Intoxication and/or Withdrawal Potential:      Dimension 2:  Biomedical Conditions and Complications:      Dimension 3:  Emotional, Behavioral, or Cognitive Conditions and Complications:     Dimension 4:  Readiness to Change:     Dimension 5:  Relapse, Continued use, or Continued Problem Potential:     Dimension 6:  Recovery/Living Environment:     ASAM Severity Score:    ASAM Recommended Level of Treatment:     Substance use Disorder (SUD)    Recommendations for Services/Supports/Treatments:    Discharge Disposition:    DSM5 Diagnoses: Patient Active Problem List   Diagnosis Date Noted   Protein-calorie malnutrition, severe 08/03/2020   COPD with exacerbation ([place]) 08/02/2020   Acute respiratory failure with hypoxia ([place]) 08/01/2020   Severe recurrent major depression with psychotic features ([place]) 07/03/2020   Hallucinations    Shortness of breath 04/20/2020   Chronic diastolic CHF (congestive heart failure) ([place]) 04/20/2020   Nausea & vomiting 04/20/2020   Prolonged QT interval 04/20/2020   COPD with acute exacerbation ([place]) 06/20/2017   DOE (dyspnea on exertion) 06/19/2017   Cervical dystonia 11/27/2016   C. difficile colitis 09/10/2016   Abdominal pain 09/08/2016   Acute encephalopathy    Fever    Left hemiparesis (HCC)    Generalized OA    Fibromyalgia    Tobacco abuse    Substance abuse ([place])    Benign essential HTN    Tachycardia    Chronic pain syndrome    Acute blood loss anemia    Leukocytosis    Septic thrombophlebitis of sagittal sinus    Acute respiratory failure (HCC)    Streptococcus pneumoniae meningitis    Streptococcal bacteremia    Meningitis    History of stroke 04/12/2016   Stroke (cerebrum) ([place]) 04/12/2016   Neck pain 04/04/2016   Chronic abdominal pain 02/19/2016   Essential hypertension 02/19/2016   Bacterial conjunctivitis 02/19/2016   Health care maintenance 02/19/2016   Anxiety  05/08/2006   Cigarette smoker 05/08/2006   ASTHMA, UNSPECIFIED 05/08/2006   GERD (gastroesophageal reflux disease) 05/08/2006   CONSTIPATION 05/08/2006   CONVULSIONS, SEIZURES, NOS 05/08/2006     Referrals to Alternative Service(s): Referred to Alternative Service(s):   Place:   Date:   Time:    Referred to Alternative  Service(s):   Place:   Date:   Time:    Referred to Alternative Service(s):   Place:   Date:   Time:    Referred to Alternative Service(s):   Place:   Date:   Time:     Waldon Merl, Counselor

## 2020-10-20 NOTE — ED Provider Notes (Signed)
Emergency Medicine Provider Triage Evaluation Note  [name] , a 70 y.o. female  was evaluated in triage.  Pt complains of sob and chest pain.  Review of Systems  Positive: Chest pain, sob Negative: Fever, cough  Physical Exam  BP (!) 146/72 (BP Location: Left Arm)   Pulse 89   Temp 98.5 F (36.9 C) (Oral)   Resp 18   SpO2 96%  Gen:   Awake, no distress   Resp:  Normal effort  MSK:   Moves extremities without difficulty  Other:  Heart with rrr, rare wheezes  Medical Decision Making  Medically screening exam initiated at 9:06 PM.  Appropriate orders placed.  [name] was informed that the remainder of the evaluation will be completed by another provider, this initial triage assessment does not replace that evaluation, and the importance of remaining in the ED until their evaluation is complete.     Bishop Dublin 10/20/20 2107    Hayden Rasmussen, MD 10/21/20 1056

## 2020-10-20 NOTE — ED Triage Notes (Signed)
The pt c/o chest heaviness and sob for 2 days

## 2020-10-20 NOTE — H&P (Signed)
Behavioral Health Medical Screening Exam  [name] is an 70 y.o. female who presented voluntarily to [facility] with her husband for assessment of "possible dementia, auditory hallucinations, and unusual behavior".   On assessment patient presents alert and oriented to person, place only; states her name is "[name]", date as "August 2021", day as "Tuesday maybe", and that she is at "[facility]". Pertaining to situation patient states, "I don't know why I'm here. I don't want to be here. I've been doing a lot of praying and talking to God". Able to complete 1/3 of 3 word recall, completed serial 2's successfully, serial 7's (-2), able to interpret proverbs; unable to complete clock test. She endorses poor sleep, husband states 2-4 hours/night, anhedonia; denies any feelings of guilt or worthlessness, energy and concentration as "okay", appetite "gets pretty poor sometimes", reports possible psychomotor changes at night; denies any suicidal behaviors or ideations. She denies any homicidal ideations, auditory or visual hallucinations, and does not appear to be responding to any external/internal stimuli at this time.   Collateral: Brei Hafford (spouse)  Per husband report patient is not sleeping and "hallucinating". He reports increased confusion and patient wandering off at times. Patient denies allegations stating "I can't just stay locked up in a room all day. I have to move around". He denies any concerns that she will harm herself or anyone; states his primary concern is her declining health. States she has an appointment with her primary care 10/24/20 to follow-up on her increased abdominal pain and weight-loss 12 lbs/1 mos; mentioned concern for cancer. Per chart history patient has extensive past medical history including pancreatic cyst, CVA, seizures, meningitis, chronic pain syndrome, and substance abuse. Per PDMP review; Temazepam 22.'5mg'$ , Lorazepam 0.'5mg'$ , Bupenorphine 2 mg 10/11/20;  husband reports patient takes mediations as prescribes with no relief. Requesting assistance in seeking services with possible neuro-cognitive testing for more definitive diagnoses and possible placement.    Total Time spent with patient: 30 minutes  Psychiatric Specialty Exam: Physical Exam Vitals and nursing note reviewed.  Constitutional:      Appearance: She is ill-appearing.  HENT:     Head: Normocephalic.     Nose: Nose normal.     Mouth/Throat:     Mouth: Mucous membranes are moist.     Pharynx: Oropharynx is clear.  Eyes:     Pupils: Pupils are equal, round, and reactive to light.  Pulmonary:     Effort: Pulmonary effort is normal.  Abdominal:     General: Abdomen is flat.  Musculoskeletal:        General: Normal range of motion.     Cervical back: Normal range of motion.  Skin:    General: Skin is warm and dry.  Neurological:     Mental Status: She is alert. She is disoriented.   Review of Systems  Psychiatric/Behavioral:  Positive for decreased concentration and sleep disturbance. Negative for hallucinations and suicidal ideas.   All other systems reviewed and are negative. There were no vitals taken for this visit.There is no height or weight on file to calculate BMI. General Appearance:  ill appearing, gaunt Eye Contact:  Fair Speech:  Clear and Coherent Volume:  Normal Mood:  Euthymic Affect:  Non-Congruent Thought Process:  Coherent Orientation:  Other:  partial Thought Content:  Logical Suicidal Thoughts:  No Homicidal Thoughts:  No Memory:  Immediate;   Poor Recent;   Poor Remote;   Fair Judgement:  Other:  inconsistent Insight:  Shallow Psychomotor Activity:  Increased Concentration: Concentration: Poor and Attention Span: Poor Recall:  Poor Fund of Knowledge:Fair Language: Fair Akathisia:  NA Handed:   AIMS (if indicated):    Assets:  Agricultural consultant Housing Resilience Social Support Sleep:      Musculoskeletal: Strength & Muscle Tone: within normal limits Gait & Station: normal Patient leans: N/A  There were no vitals taken for this visit.  Recommendations: Based on my evaluation the patient does not appear to have an emergency medical condition. Patient does not appear to be suicidal, homicidal, have any auditory or visual hallucinations, or any active psychosis at this time. Patient does not meet requirement for inpatient psychiatric admission; however does appear to have increased symptoms of cognitive decline that require further follow-up with specialist. Resources for [place],  [employer], and Adult Services and Programs provided to husband with plan to follow up; appointment set with PCP 10/24/20.   Inda Merlin, NP 10/20/2020, 10:17 AM

## 2020-10-21 LAB — TROPONIN I (HIGH SENSITIVITY): Troponin I (High Sensitivity): 6 ng/L (ref <18)

## 2020-10-21 MED ORDER — IPRATROPIUM BROMIDE HFA 17 MCG/ACT IN AERS
1.0000 | INHALATION_SPRAY | Freq: Once | RESPIRATORY_TRACT | Status: AC
  Administered 2020-10-21: 1 via RESPIRATORY_TRACT
  Filled 2020-10-21: qty 12.9

## 2020-10-21 MED ORDER — LIDOCAINE VISCOUS HCL 2 % MT SOLN
15.0000 mL | Freq: Once | OROMUCOSAL | Status: AC
  Administered 2020-10-21: 15 mL via ORAL
  Filled 2020-10-21: qty 15

## 2020-10-21 MED ORDER — ALUM & MAG HYDROXIDE-SIMETH 200-200-20 MG/5ML PO SUSP
30.0000 mL | Freq: Once | ORAL | Status: AC
  Administered 2020-10-21: 30 mL via ORAL
  Filled 2020-10-21: qty 30

## 2020-10-21 MED ORDER — ALBUTEROL SULFATE HFA 108 (90 BASE) MCG/ACT IN AERS
4.0000 | INHALATION_SPRAY | Freq: Once | RESPIRATORY_TRACT | Status: AC
  Administered 2020-10-21: 4 via RESPIRATORY_TRACT
  Filled 2020-10-21: qty 6.7

## 2020-10-21 NOTE — ED Provider Notes (Signed)
[place] EMERGENCY DEPARTMENT Provider Note  CSN: [id number] Arrival date & time: 10/20/20 2055  Chief Complaint(s) Chest Pain  HPI [name] is a 70 y.o. female    Chest Pain Pain location:  Substernal area Pain quality: pressure and tightness   Pain radiates to:  Does not radiate Pain severity:  Moderate Onset quality:  Gradual Duration:  12 hours Timing:  Constant Progression:  Unchanged Chronicity:  New Relieved by:  Nothing Worsened by:  Nothing Associated symptoms: abdominal pain (epigastric), anxiety and shortness of breath   Associated symptoms: no cough, no fever, no heartburn and no nausea    Patient feels like her pancreas is acting up again.  Reports that she had quit smoking for 6 months and started this past week.  Past Medical History Past Medical History:  Diagnosis Date   Acute blood loss anemia    Arrhythmia    Arthritis    C. difficile colitis    Cancer ([place])    Diverticulosis    Fibromyalgia    GERD (gastroesophageal reflux disease)    GI hemorrhage    H/O: substance abuse ([place]) 2005   narcotic usage due to chronic back pain   Heart murmur    Hypertension    Pancreatic cyst    Pneumococcal meningitis    PONV (postoperative nausea and vomiting)    Renal cyst    Stroke (cerebrum) [facility])    Patient Active Problem List   Diagnosis Date Noted   Protein-calorie malnutrition, severe 08/03/2020   COPD with exacerbation ([place]) 08/02/2020   Acute respiratory failure with hypoxia ([place]) 08/01/2020   Severe recurrent major depression with psychotic features ([place]) 07/03/2020   Hallucinations    Shortness of breath 04/20/2020   Chronic diastolic CHF (congestive heart failure) ([place]) 04/20/2020   Nausea & vomiting 04/20/2020   Prolonged QT interval 04/20/2020   COPD with acute exacerbation ([place]) 06/20/2017   DOE (dyspnea on exertion) 06/19/2017   Cervical dystonia 11/27/2016   C. difficile colitis 09/10/2016   Abdominal  pain 09/08/2016   Acute encephalopathy    Fever    Left hemiparesis (HCC)    Generalized OA    Fibromyalgia    Tobacco abuse    Substance abuse (HCC)    Benign essential HTN    Tachycardia    Chronic pain syndrome    Acute blood loss anemia    Leukocytosis    Septic thrombophlebitis of sagittal sinus    Acute respiratory failure (HCC)    Streptococcus pneumoniae meningitis    Streptococcal bacteremia    Meningitis    History of stroke 04/12/2016   Stroke (cerebrum) ([place]) 04/12/2016   Neck pain 04/04/2016   Chronic abdominal pain 02/19/2016   Essential hypertension 02/19/2016   Bacterial conjunctivitis 02/19/2016   Health care maintenance 02/19/2016   Anxiety 05/08/2006   Cigarette smoker 05/08/2006   ASTHMA, UNSPECIFIED 05/08/2006   GERD (gastroesophageal reflux disease) 05/08/2006   CONSTIPATION 05/08/2006   CONVULSIONS, SEIZURES, NOS 05/08/2006   Home Medication(s) Prior to Admission medications   Medication Sig Start Date End Date Taking? Authorizing Provider  buprenorphine (SUBUTEX) 2 MG SUBL SL tablet Place 4 mg under the tongue daily. LF on 09-15-20 # 60 DS 30 04/15/20  Yes [provider]  carvedilol (COREG) 6.25 MG tablet Take 6.25 mg by mouth 2 (two) times daily with a meal. 12/25/17  Yes [provider]  doxepin (SINEQUAN) 50 MG capsule Take 50 mg by mouth daily. 10/11/20  Yes  [provider]  LORazepam (ATIVAN) 0.5 MG tablet Take 0.5 mg by mouth 2 (two) times daily as needed for anxiety. 09/15/20  Yes [provider]  temazepam (RESTORIL) 22.5 MG capsule Take 22.5 mg by mouth at bedtime. 12/02/17  Yes [provider]  albuterol (VENTOLIN HFA) 108 (90 Base) MCG/ACT inhaler Inhale 2 puffs into the lungs every 6 (six) hours as needed for wheezing or shortness of breath. Patient not taking: No sig reported 04/22/20   Modena Jansky, MD  benzonatate (TESSALON) 100 MG capsule Take 1 capsule (100 mg total) by mouth 3 (three) times  daily. Patient not taking: No sig reported 08/06/20   Bonnielee Haff, MD  ciprofloxacin (CILOXAN) 0.3 % ophthalmic solution Place 1 drop into the right eye every 4 (four) hours while awake. Administer 1 drop, every 2 hours, while awake, for 2 days. Then 1 drop, every 4 hours, while awake, for the next 5 days. Patient not taking: No sig reported 08/06/20   Bonnielee Haff, MD  ipratropium-albuterol (DUONEB) 0.5-2.5 (3) MG/3ML SOLN Take 3 mLs by nebulization every 6 (six) hours as needed. Patient not taking: No sig reported 08/06/20   Bonnielee Haff, MD  mometasone-formoterol [facility]) 100-5 MCG/ACT AERO Inhale 2 puffs into the lungs 2 (two) times daily. Patient not taking: No sig reported 04/22/20   Modena Jansky, MD  OLANZapine zydis (ZYPREXA) 10 MG disintegrating tablet Take 1 tablet (10 mg total) by mouth at bedtime for 10 days. Patient not taking: Reported on 10/21/2020 07/03/20 07/13/20  Luna Fuse, MD  polyethylene glycol [facility]) packet Take 17 g by mouth daily. Patient not taking: No sig reported 01/04/18   Isla Pence, MD  predniSONE (DELTASONE) 20 MG tablet Take 3 tablets once daily for 4 days followed by 2 tablets once daily for 4 days followed by 1 tablet once daily for 4 days and then stop Patient not taking: No sig reported 08/06/20   Bonnielee Haff, MD  scopolamine (TRANSDERM-SCOP) 1 MG/3DAYS Place 1 patch (1.5 mg total) onto the skin every 3 (three) days. Patient not taking: No sig reported 04/24/20   Modena Jansky, MD  simethicone (MYLICON) 0000000 MG chewable tablet Chew 125 mg by mouth every 6 (six) hours as needed for flatulence. Patient not taking: No sig reported    [provider]                                                                                                                                    Past Surgical History Past Surgical History:  Procedure Laterality Date   ABDOMINAL HYSTERECTOMY     ABDOMINAL SURGERY     APPENDECTOMY     BIOPSY   04/22/2020   Procedure: BIOPSY;  Surgeon: Milus Banister, MD;  Location: [facility];  Service: Gastroenterology;;   BREAST SURGERY     CHOLECYSTECTOMY N/A 09/30/2012   Procedure: LAPAROSCOPIC CHOLECYSTECTOMY WITH INTRAOPERATIVE CHOLANGIOGRAM;  Surgeon: Imogene Burn. Georgette Dover, MD;  Location: [facility];  Service: General;  Laterality: N/A;   ESOPHAGOGASTRODUODENOSCOPY (EGD) WITH PROPOFOL N/A 04/22/2020   Procedure: ESOPHAGOGASTRODUODENOSCOPY (EGD) WITH PROPOFOL;  Surgeon: Milus Banister, MD;  Location: [facility];  Service: Gastroenterology;  Laterality: N/A;   FRACTURE SURGERY     right upper arm   OVARIAN CYST SURGERY     Family History Family History  Problem Relation Age of Onset   Alzheimer's disease Mother    Heart disease Father    Prostate cancer Father     Social History Social History   Tobacco Use   Smoking status: Every Day    Packs/day: 0.75    Years: 49.00    Pack years: 36.75    Types: Cigarettes   Smokeless tobacco: Never  Vaping Use   Vaping Use: Never used  Substance Use Topics   Alcohol use: No   Drug use: No    Comment: Methadone for narcotic use   Allergies Tylenol [acetaminophen], Codeine, Nsaids, Tolmetin, and Tramadol  Review of Systems Review of Systems  Constitutional:  Negative for fever.  Respiratory:  Positive for shortness of breath. Negative for cough.   Cardiovascular:  Positive for chest pain.  Gastrointestinal:  Positive for abdominal pain (epigastric). Negative for heartburn and nausea.  All other systems are reviewed and are negative for acute change except as noted in the HPI  Physical Exam Vital Signs  I have reviewed the triage vital signs BP (!) 174/110   Pulse 78   Temp 98.5 F (36.9 C) (Oral)   Resp 17   Ht '5\' 3"'$  (1.6 m)   Wt 44.9 kg   SpO2 100%   BMI 17.53 kg/m   Physical Exam Vitals reviewed.  Constitutional:      General: She is not in acute distress.    Appearance: She is well-developed. She is not diaphoretic.   HENT:     Head: Normocephalic and atraumatic.     Nose: Nose normal.  Eyes:     General: No scleral icterus.       Right eye: No discharge.        Left eye: No discharge.     Conjunctiva/sclera: Conjunctivae normal.     Pupils: Pupils are equal, round, and reactive to light.  Cardiovascular:     Rate and Rhythm: Normal rate and regular rhythm.     Heart sounds: No murmur heard.   No friction rub. No gallop.  Pulmonary:     Effort: Pulmonary effort is normal. No respiratory distress.     Breath sounds: No stridor. Examination of the right-upper field reveals wheezing. Examination of the left-upper field reveals wheezing. Examination of the left-middle field reveals wheezing. Wheezing present. No rales.  Abdominal:     General: There is no distension.     Palpations: Abdomen is soft.     Tenderness: There is no abdominal tenderness.  Musculoskeletal:        General: No tenderness.     Cervical back: Normal range of motion and neck supple.  Skin:    General: Skin is warm and dry.     Findings: No erythema or rash.  Neurological:     Mental Status: She is alert and oriented to person, place, and time.    ED Results and Treatments Labs (all labs ordered are listed, but only abnormal results are displayed) Labs Reviewed  COMPREHENSIVE METABOLIC PANEL - Abnormal; Notable for the following components:  Result Value   Potassium 3.1 (*)    Glucose, Bld 113 (*)    Total Protein 6.4 (*)    All other components within normal limits  CBC WITH DIFFERENTIAL/PLATELET - Abnormal; Notable for the following components:   WBC 11.2 (*)    All other components within normal limits  RESP PANEL BY RT-PCR (FLU A&B, COVID) ARPGX2  LIPASE, BLOOD  URINALYSIS, ROUTINE W REFLEX MICROSCOPIC  TROPONIN I (HIGH SENSITIVITY)  TROPONIN I (HIGH SENSITIVITY)                                                                                                                         EKG  EKG  Interpretation  Date/Time:    Ventricular Rate:    PR Interval:    QRS Duration:   QT Interval:    QTC Calculation:   R Axis:     Text Interpretation:         Radiology DG Chest 2 View  Result Date: 10/20/2020 CLINICAL DATA:  Chest heaviness and shortness of breath x2 days. EXAM: CHEST - 2 VIEW COMPARISON:  Aug 01, 2020 FINDINGS: The lungs are hyperinflated. Mild, diffuse, chronic appearing increased interstitial lung markings are seen. There is no evidence of an acute infiltrate, pleural effusion or pneumothorax. An ill-defined nipple shadow is seen overlying the right lung base. The heart size and mediastinal contours are within normal limits. Mild to moderate severity calcification of the aortic arch is seen. No acute osseous abnormalities are identified. IMPRESSION: Chronic hyperinflation without active cardiopulmonary disease. Electronically Signed   By: Virgina Norfolk M.D.   On: 10/20/2020 21:41    Pertinent labs & imaging results that were available during my care of the patient were reviewed by me and considered in my medical decision making (see MDM for details).  Medications Ordered in ED Medications  alum & mag hydroxide-simeth (MAALOX/MYLANTA) 200-200-20 MG/5ML suspension 30 mL (30 mLs Oral Given 10/21/20 0013)    And  lidocaine (XYLOCAINE) 2 % viscous mouth solution 15 mL (15 mLs Oral Given 10/21/20 0013)  albuterol (VENTOLIN HFA) 108 (90 Base) MCG/ACT inhaler 4 puff (4 puffs Inhalation Given 10/21/20 0012)  ipratropium (ATROVENT HFA) inhaler 1 puff (1 puff Inhalation Given 10/21/20 0012)  Procedures Procedures  (including critical care time)  Medical Decision Making / ED Course I have reviewed the nursing notes for this encounter and the patient's prior records (if available in EHR or on provided paperwork).  Elliya Frisinger was evaluated  in Emergency Department on 10/21/2020 for the symptoms described in the history of present illness. She was evaluated in the context of the global COVID-19 pandemic, which necessitated consideration that the patient might be at risk for infection with the SARS-CoV-2 virus that causes COVID-19. Institutional protocols and algorithms that pertain to the evaluation of patients at risk for COVID-19 are in a state of rapid change based on information released by regulatory bodies including the CDC and federal and state organizations. These policies and algorithms were followed during the patient's care in the ED.     Atypical chest pain. Ongoing for approximately 12 hours. EKG without acute ischemic changes or evidence of pericarditis. Initial troponin negative.  Given the duration of the patient's pain feel that ACS would be sufficiently with a second troponin was negative. Second Trope was negative. Low suspicion for pulmonary embolism. Presentation advised aortic dissection or esophageal perforation.  Patient was noted to have wheezing.  Given albuterol.  Patient also complaining of upper abdominal discomfort.  Given a GI cocktail.  Chest x-ray without evidence suggestive of pneumonia, pneumothorax, pneumomediastinum.  No abnormal contour of the mediastinum to suggest dissection. No evidence of acute injuries.  Pertinent labs & imaging results that were available during my care of the patient were reviewed by me and considered in my medical decision making:  Prior to reassessment patient eloped.   Final Clinical Impression(s) / ED Diagnoses Final diagnoses:  Precordial pain     This chart was dictated using voice recognition software.  Despite best efforts to proofread,  errors can occur which can change the documentation meaning.    Fatima Blank, MD 10/21/20 [phone]

## 2020-10-21 NOTE — ED Notes (Signed)
Per [facility] staff, pt seen leaving ED.

## 2020-10-30 ENCOUNTER — Emergency Department (HOSPITAL_COMMUNITY)
Admission: EM | Admit: 2020-10-30 | Discharge: 2020-10-30 | Disposition: A | Discharge status: Home / Self Care | Payer: Medicare Other | Attending: Emergency Medicine | Admitting: Emergency Medicine

## 2020-10-30 ENCOUNTER — Other Ambulatory Visit: Payer: Self-pay

## 2020-10-30 ENCOUNTER — Emergency Department (HOSPITAL_COMMUNITY): Payer: Medicare Other

## 2020-10-30 ENCOUNTER — Encounter (HOSPITAL_COMMUNITY): Payer: Self-pay

## 2020-10-30 DIAGNOSIS — J45909 Unspecified asthma, uncomplicated: Secondary | ICD-10-CM | POA: Diagnosis not present

## 2020-10-30 DIAGNOSIS — F17210 Nicotine dependence, cigarettes, uncomplicated: Secondary | ICD-10-CM | POA: Insufficient documentation

## 2020-10-30 DIAGNOSIS — R531 Weakness: Secondary | ICD-10-CM | POA: Insufficient documentation

## 2020-10-30 DIAGNOSIS — J449 Chronic obstructive pulmonary disease, unspecified: Secondary | ICD-10-CM | POA: Diagnosis not present

## 2020-10-30 DIAGNOSIS — I110 Hypertensive heart disease with heart failure: Secondary | ICD-10-CM | POA: Diagnosis not present

## 2020-10-30 DIAGNOSIS — I5032 Chronic diastolic (congestive) heart failure: Secondary | ICD-10-CM | POA: Insufficient documentation

## 2020-10-30 DIAGNOSIS — R627 Adult failure to thrive: Secondary | ICD-10-CM | POA: Diagnosis present

## 2020-10-30 DIAGNOSIS — Z79899 Other long term (current) drug therapy: Secondary | ICD-10-CM | POA: Diagnosis not present

## 2020-10-30 LAB — CBC WITH DIFFERENTIAL/PLATELET
Abs Immature Granulocytes: 0.02 10*3/uL (ref 0.00–0.07)
Basophils Absolute: 0.1 10*3/uL (ref 0.0–0.1)
Basophils Relative: 1 %
Eosinophils Absolute: 0.1 10*3/uL (ref 0.0–0.5)
Eosinophils Relative: 1 %
HCT: 45.9 % (ref 36.0–46.0)
Hemoglobin: 14.9 g/dL (ref 12.0–15.0)
Immature Granulocytes: 0 %
Lymphocytes Relative: 24 %
Lymphs Abs: 2.1 10*3/uL (ref 0.7–4.0)
MCH: 28.5 pg (ref 26.0–34.0)
MCHC: 32.5 g/dL (ref 30.0–36.0)
MCV: 87.9 fL (ref 80.0–100.0)
Monocytes Absolute: 0.8 10*3/uL (ref 0.1–1.0)
Monocytes Relative: 9 %
Neutro Abs: 5.7 10*3/uL (ref 1.7–7.7)
Neutrophils Relative %: 65 %
Platelets: 320 10*3/uL (ref 150–400)
RBC: 5.22 MIL/uL — ABNORMAL HIGH (ref 3.87–5.11)
RDW: 14 % (ref 11.5–15.5)
WBC: 8.7 10*3/uL (ref 4.0–10.5)
nRBC: 0 % (ref 0.0–0.2)

## 2020-10-30 LAB — COMPREHENSIVE METABOLIC PANEL
ALT: 19 U/L (ref 0–44)
AST: 25 U/L (ref 15–41)
Albumin: 4.3 g/dL (ref 3.5–5.0)
Alkaline Phosphatase: 79 U/L (ref 38–126)
Anion gap: 10 (ref 5–15)
BUN: 25 mg/dL — ABNORMAL HIGH (ref 8–23)
CO2: 32 mmol/L (ref 22–32)
Calcium: 9.9 mg/dL (ref 8.9–10.3)
Chloride: 103 mmol/L (ref 98–111)
Creatinine, Ser: 0.51 mg/dL (ref 0.44–1.00)
GFR, Estimated: >60 mL/min (ref >60)
Glucose, Bld: 102 mg/dL — ABNORMAL HIGH (ref 70–99)
Potassium: 3.1 mmol/L — ABNORMAL LOW (ref 3.5–5.1)
Sodium: 145 mmol/L (ref 135–145)
Total Bilirubin: 0.7 mg/dL (ref 0.3–1.2)
Total Protein: 7.1 g/dL (ref 6.5–8.1)

## 2020-10-30 LAB — ETHANOL: Alcohol, Ethyl (B): <10 mg/dL (ref <10)

## 2020-10-30 MED ORDER — LACTATED RINGERS IV BOLUS
1000.0000 mL | Freq: Once | INTRAVENOUS | Status: AC
  Administered 2020-10-30: 1000 mL via INTRAVENOUS

## 2020-10-30 MED ORDER — DOXEPIN HCL 50 MG PO CAPS
50.0000 mg | ORAL_CAPSULE | Freq: Every day | ORAL | Status: DC
  Filled 2020-10-30: qty 1

## 2020-10-30 MED ORDER — ACETAMINOPHEN 500 MG PO TABS: 1000.0000 mg | ORAL_TABLET | Freq: Four times a day (QID) | ORAL | Status: DC | PRN

## 2020-10-30 MED ORDER — LACTATED RINGERS IV SOLN: INTRAVENOUS | Status: DC

## 2020-10-30 MED ORDER — CARVEDILOL 3.125 MG PO TABS: 6.2500 mg | ORAL_TABLET | Freq: Two times a day (BID) | ORAL | Status: DC

## 2020-10-30 MED ORDER — LORAZEPAM 0.5 MG PO TABS: 0.5000 mg | ORAL_TABLET | Freq: Two times a day (BID) | ORAL | Status: DC | PRN

## 2020-10-30 MED ORDER — POTASSIUM CHLORIDE CRYS ER 20 MEQ PO TBCR: 40.0000 meq | EXTENDED_RELEASE_TABLET | Freq: Once | ORAL | Status: DC

## 2020-10-30 MED ORDER — TEMAZEPAM 7.5 MG PO CAPS: 22.5000 mg | ORAL_CAPSULE | Freq: Every day | ORAL | Status: DC

## 2020-10-30 NOTE — ED Provider Notes (Signed)
[facility] DEPT Provider Note   CSN: [id number] Arrival date & time: 10/30/20  1201     History Chief Complaint  Patient presents with   Behavioral Issues   Failure To Thrive    [name] is a 70 y.o. female.  70 year old female presents due to concern for failure to thrive.  Patient states that she has had issues with eating and drinking for several weeks.  Has a history of dementia apparently.  Has had some bizarre behavior according to the husband who was not present.  Patient stopped notes some weakness but denies any chest or abdominal discomfort.  Denies any SI or HI.  Here for further management.  Apparently husband states he cannot take care of her.      Past Medical History:  Diagnosis Date   Acute blood loss anemia    Arrhythmia    Arthritis    C. difficile colitis    Cancer ([place])    Diverticulosis    Fibromyalgia    GERD (gastroesophageal reflux disease)    GI hemorrhage    H/O: substance abuse ([place]) 2005   narcotic usage due to chronic back pain   Heart murmur    Hypertension    Pancreatic cyst    Pneumococcal meningitis    PONV (postoperative nausea and vomiting)    Renal cyst    Stroke (cerebrum) [facility])     Patient Active Problem List   Diagnosis Date Noted   Protein-calorie malnutrition, severe 08/03/2020   COPD with exacerbation ([place]) 08/02/2020   Acute respiratory failure with hypoxia ([place]) 08/01/2020   Severe recurrent major depression with psychotic features ([place]) 07/03/2020   Hallucinations    Shortness of breath 04/20/2020   Chronic diastolic CHF (congestive heart failure) ([place]) 04/20/2020   Nausea & vomiting 04/20/2020   Prolonged QT interval 04/20/2020   COPD with acute exacerbation ([place]) 06/20/2017   DOE (dyspnea on exertion) 06/19/2017   Cervical dystonia 11/27/2016   C. difficile colitis 09/10/2016   Abdominal pain 09/08/2016   Acute encephalopathy    Fever    Left hemiparesis (HCC)     Generalized OA    Fibromyalgia    Tobacco abuse    Substance abuse (HCC)    Benign essential HTN    Tachycardia    Chronic pain syndrome    Acute blood loss anemia    Leukocytosis    Septic thrombophlebitis of sagittal sinus    Acute respiratory failure (HCC)    Streptococcus pneumoniae meningitis    Streptococcal bacteremia    Meningitis    History of stroke 04/12/2016   Stroke (cerebrum) ([place]) 04/12/2016   Neck pain 04/04/2016   Chronic abdominal pain 02/19/2016   Essential hypertension 02/19/2016   Bacterial conjunctivitis 02/19/2016   Health care maintenance 02/19/2016   Anxiety 05/08/2006   Cigarette smoker 05/08/2006   ASTHMA, UNSPECIFIED 05/08/2006   GERD (gastroesophageal reflux disease) 05/08/2006   CONSTIPATION 05/08/2006   CONVULSIONS, SEIZURES, NOS 05/08/2006    Past Surgical History:  Procedure Laterality Date   ABDOMINAL HYSTERECTOMY     ABDOMINAL SURGERY     APPENDECTOMY     BIOPSY  04/22/2020   Procedure: BIOPSY;  Surgeon: Milus Banister, MD;  Location: [facility];  Service: Gastroenterology;;   BREAST SURGERY     CHOLECYSTECTOMY N/A 09/30/2012   Procedure: LAPAROSCOPIC CHOLECYSTECTOMY WITH INTRAOPERATIVE CHOLANGIOGRAM;  Surgeon: Imogene Burn. Georgette Dover, MD;  Location: [facility];  Service: General;  Laterality: N/A;   ESOPHAGOGASTRODUODENOSCOPY (EGD) WITH  PROPOFOL N/A 04/22/2020   Procedure: ESOPHAGOGASTRODUODENOSCOPY (EGD) WITH PROPOFOL;  Surgeon: Milus Banister, MD;  Location[facility];  Service: Gastroenterology;  Laterality: N/A;   FRACTURE SURGERY     right upper arm   OVARIAN CYST SURGERY       OB History   No obstetric history on file.     Family History  Problem Relation Age of Onset   Alzheimer's disease Mother    Heart disease Father    Prostate cancer Father     Social History   Tobacco Use   Smoking status: Every Day    Packs/day: 0.75    Years: 49.00    Pack years: 36.75    Types: Cigarettes   Smokeless tobacco: Never   Vaping Use   Vaping Use: Never used  Substance Use Topics   Alcohol use: No   Drug use: No    Comment: Methadone for narcotic use    Home Medications Prior to Admission medications   Medication Sig Start Date End Date Taking? Authorizing Provider  acetaminophen (TYLENOL) 500 MG tablet Take 1,000 mg by mouth every 6 (six) hours as needed for mild pain.   Yes [provider]  buprenorphine (SUBUTEX) 2 MG SUBL SL tablet Place 2 mg under the tongue 3 (three) times daily. 04/15/20  Yes [provider]  carvedilol (COREG) 6.25 MG tablet Take 6.25 mg by mouth 2 (two) times daily with a meal. 12/25/17  Yes [provider]  doxepin (SINEQUAN) 50 MG capsule Take 50 mg by mouth daily. 10/11/20  Yes [provider]  LORazepam (ATIVAN) 0.5 MG tablet Take 0.5 mg by mouth 2 (two) times daily as needed for anxiety. 09/15/20  Yes [provider]  temazepam (RESTORIL) 22.5 MG capsule Take 22.5 mg by mouth at bedtime. 12/02/17  Yes [provider]  OLANZapine zydis (ZYPREXA) 10 MG disintegrating tablet Take 1 tablet (10 mg total) by mouth at bedtime for 10 days. Patient not taking: Reported on 10/21/2020 07/03/20 07/13/20  Luna Fuse, MD    Allergies    Tylenol [acetaminophen], Codeine, Nsaids, Tolmetin, and Tramadol  Review of Systems   Review of Systems  All other systems reviewed and are negative.  Physical Exam Updated Vital Signs BP (!) 170/94   Pulse 67   Temp 98.7 F (37.1 C) (Oral)   Resp 16   SpO2 100%   Physical Exam Vitals and nursing note reviewed.  Constitutional:      General: She is not in acute distress.    Appearance: Normal appearance. She is well-developed. She is not toxic-appearing.  HENT:     Head: Normocephalic and atraumatic.  Eyes:     General: Lids are normal.     Conjunctiva/sclera: Conjunctivae normal.     Pupils: Pupils are equal, round, and reactive to light.  Neck:     Thyroid: No thyroid mass.     Trachea:  No tracheal deviation.  Cardiovascular:     Rate and Rhythm: Normal rate and regular rhythm.     Heart sounds: Normal heart sounds. No murmur heard.   No gallop.  Pulmonary:     Effort: Pulmonary effort is normal. No respiratory distress.     Breath sounds: Normal breath sounds. No stridor. No decreased breath sounds, wheezing, rhonchi or rales.  Abdominal:     General: There is no distension.     Palpations: Abdomen is soft.     Tenderness: There is no abdominal tenderness. There is no  rebound.  Musculoskeletal:        General: No tenderness. Normal range of motion.     Cervical back: Normal range of motion and neck supple.  Skin:    General: Skin is warm and dry.     Findings: No abrasion or rash.  Neurological:     General: No focal deficit present.     Mental Status: She is alert and oriented to person, place, and time. Mental status is at baseline.     GCS: GCS eye subscore is 4. GCS verbal subscore is 5. GCS motor subscore is 6.     Cranial Nerves: Cranial nerves are intact. No cranial nerve deficit.     Sensory: No sensory deficit.     Motor: Motor function is intact.  Psychiatric:        Attention and Perception: Attention normal.        Mood and Affect: Affect is flat.        Speech: Speech is delayed.        Behavior: Behavior is withdrawn.    ED Results / Procedures / Treatments   Labs (all labs ordered are listed, but only abnormal results are displayed) Labs Reviewed  RESP PANEL BY RT-PCR (FLU A&B, COVID) ARPGX2  ETHANOL  RAPID URINE DRUG SCREEN, HOSP PERFORMED  CBC WITH DIFFERENTIAL/PLATELET  COMPREHENSIVE METABOLIC PANEL  URINALYSIS, ROUTINE W REFLEX MICROSCOPIC    EKG None  Radiology No results found.  Procedures Procedures   Medications Ordered in ED Medications  lactated ringers infusion ( Intravenous New Bag/Given 10/30/20 1326)  lactated ringers bolus 1,000 mL (1,000 mLs Intravenous New Bag/Given 10/30/20 1325)    ED Course  I have  reviewed the triage vital signs and the nursing notes.  Pertinent labs & imaging results that were available during my care of the patient were reviewed by me and considered in my medical decision making (see chart for details).    MDM Rules/Calculators/A&P                           Patient's laboratory studies and head CT are reassuring here.  Chest x-ray without acute findings.  Long discussion with patient's spouse who refuses to take the patient back to his hotel room.  Discussed with transitions of care who will work on placement.  Home medications reordered Final Clinical Impression(s) / ED Diagnoses Final diagnoses:  None    Rx / DC Orders ED Discharge Orders     None        Lacretia Leigh, MD 10/30/20 [phone]

## 2020-10-30 NOTE — TOC Initial Note (Addendum)
Transition of Care [facility]) - Initial/Assessment Note    Patient Details  Name: [name] MRN: [id number] Date of Birth: April 09, 1950  Transition of Care [facility]) CM/SW Contact:    Anselm Pancoast, RN Phone Number: 10/30/2020, 5:09 PM  Clinical Narrative:                 Patient from hotel where she was living with her husband.Husband reported he is no longer able to care for her and needs assistance with placement due to not being safe at home and wandering.         Patient Goals and CMS Choice        Expected Discharge Plan and Services                                                Prior Living Arrangements/Services                       Activities of Daily Living      Permission Sought/Granted                  Emotional Assessment              Admission diagnosis:  dementia;behavioral Patient Active Problem List   Diagnosis Date Noted   Protein-calorie malnutrition, severe 08/03/2020   COPD with exacerbation ([place]) 08/02/2020   Acute respiratory failure with hypoxia ([place]) 08/01/2020   Severe recurrent major depression with psychotic features ([place]) 07/03/2020   Hallucinations    Shortness of breath 04/20/2020   Chronic diastolic CHF (congestive heart failure) ([place]) 04/20/2020   Nausea & vomiting 04/20/2020   Prolonged QT interval 04/20/2020   COPD with acute exacerbation ([place]) 06/20/2017   DOE (dyspnea on exertion) 06/19/2017   Cervical dystonia 11/27/2016   C. difficile colitis 09/10/2016   Abdominal pain 09/08/2016   Acute encephalopathy    Fever    Left hemiparesis (HCC)    Generalized OA    Fibromyalgia    Tobacco abuse    Substance abuse (HCC)    Benign essential HTN    Tachycardia    Chronic pain syndrome    Acute blood loss anemia    Leukocytosis    Septic thrombophlebitis of sagittal sinus    Acute respiratory failure (HCC)    Streptococcus pneumoniae meningitis    Streptococcal bacteremia    Meningitis     History of stroke 04/12/2016   Stroke (cerebrum) ([place]) 04/12/2016   Neck pain 04/04/2016   Chronic abdominal pain 02/19/2016   Essential hypertension 02/19/2016   Bacterial conjunctivitis 02/19/2016   Health care maintenance 02/19/2016   Anxiety 05/08/2006   Cigarette smoker 05/08/2006   ASTHMA, UNSPECIFIED 05/08/2006   GERD (gastroesophageal reflux disease) 05/08/2006   CONSTIPATION 05/08/2006   CONVULSIONS, SEIZURES, NOS 05/08/2006   PCP:  [name], MD Pharmacy:   [id number] [place], [place] [address]Phone: [phone]:[phone]    Social Determinants of Health (SDOH) Interventions    Readmission Risk Interventions No flowsheet data found.

## 2020-10-30 NOTE — ED Triage Notes (Signed)
EMS reports from [place], husband called for failure to thrive, not eating and drinking and increasing behavioral issues, Hx of dementia. Husband states Pt has been rolling in grass, leaving room without telling him, and odd behavior x several months and increasing over last weeks. Husband states unable to care for her.  BP 182/100 HR 100 RR 16 Sp02 98 RA CBG 113

## 2020-11-04 ENCOUNTER — Emergency Department (HOSPITAL_COMMUNITY)
Admission: EM | Admit: 2020-11-04 | Discharge: 2020-11-15 | Disposition: A | Discharge status: Home / Self Care | Payer: Medicare Other | Attending: Emergency Medicine | Admitting: Emergency Medicine

## 2020-11-04 ENCOUNTER — Other Ambulatory Visit: Payer: Self-pay

## 2020-11-04 ENCOUNTER — Encounter (HOSPITAL_COMMUNITY): Payer: Self-pay

## 2020-11-04 DIAGNOSIS — I110 Hypertensive heart disease with heart failure: Secondary | ICD-10-CM | POA: Diagnosis not present

## 2020-11-04 DIAGNOSIS — F99 Mental disorder, not otherwise specified: Secondary | ICD-10-CM | POA: Insufficient documentation

## 2020-11-04 DIAGNOSIS — I5032 Chronic diastolic (congestive) heart failure: Secondary | ICD-10-CM | POA: Insufficient documentation

## 2020-11-04 DIAGNOSIS — F17210 Nicotine dependence, cigarettes, uncomplicated: Secondary | ICD-10-CM | POA: Diagnosis not present

## 2020-11-04 DIAGNOSIS — J449 Chronic obstructive pulmonary disease, unspecified: Secondary | ICD-10-CM | POA: Insufficient documentation

## 2020-11-04 DIAGNOSIS — K219 Gastro-esophageal reflux disease without esophagitis: Secondary | ICD-10-CM | POA: Insufficient documentation

## 2020-11-04 DIAGNOSIS — E876 Hypokalemia: Secondary | ICD-10-CM | POA: Diagnosis not present

## 2020-11-04 DIAGNOSIS — Z20822 Contact with and (suspected) exposure to covid-19: Secondary | ICD-10-CM | POA: Diagnosis not present

## 2020-11-04 DIAGNOSIS — Z79899 Other long term (current) drug therapy: Secondary | ICD-10-CM | POA: Insufficient documentation

## 2020-11-04 DIAGNOSIS — Z9114 Patient's other noncompliance with medication regimen: Secondary | ICD-10-CM | POA: Diagnosis not present

## 2020-11-04 DIAGNOSIS — F333 Major depressive disorder, recurrent, severe with psychotic symptoms: Secondary | ICD-10-CM | POA: Diagnosis not present

## 2020-11-04 DIAGNOSIS — J45909 Unspecified asthma, uncomplicated: Secondary | ICD-10-CM | POA: Diagnosis not present

## 2020-11-04 DIAGNOSIS — R109 Unspecified abdominal pain: Secondary | ICD-10-CM | POA: Insufficient documentation

## 2020-11-04 DIAGNOSIS — Z859 Personal history of malignant neoplasm, unspecified: Secondary | ICD-10-CM | POA: Insufficient documentation

## 2020-11-04 DIAGNOSIS — F32A Depression, unspecified: Secondary | ICD-10-CM | POA: Diagnosis present

## 2020-11-04 LAB — CBC WITH DIFFERENTIAL/PLATELET
Abs Immature Granulocytes: 0.04 10*3/uL (ref 0.00–0.07)
Basophils Absolute: 0.1 10*3/uL (ref 0.0–0.1)
Basophils Relative: 1 %
Eosinophils Absolute: 0 10*3/uL (ref 0.0–0.5)
Eosinophils Relative: 0 %
HCT: 43.9 % (ref 36.0–46.0)
Hemoglobin: 14.5 g/dL (ref 12.0–15.0)
Immature Granulocytes: 0 %
Lymphocytes Relative: 20 %
Lymphs Abs: 2 10*3/uL (ref 0.7–4.0)
MCH: 29.2 pg (ref 26.0–34.0)
MCHC: 33 g/dL (ref 30.0–36.0)
MCV: 88.5 fL (ref 80.0–100.0)
Monocytes Absolute: 0.8 10*3/uL (ref 0.1–1.0)
Monocytes Relative: 8 %
Neutro Abs: 7 10*3/uL (ref 1.7–7.7)
Neutrophils Relative %: 71 %
Platelets: 342 10*3/uL (ref 150–400)
RBC: 4.96 MIL/uL (ref 3.87–5.11)
RDW: 13.7 % (ref 11.5–15.5)
WBC: 9.9 10*3/uL (ref 4.0–10.5)
nRBC: 0 % (ref 0.0–0.2)

## 2020-11-04 LAB — COMPREHENSIVE METABOLIC PANEL
ALT: 18 U/L (ref 0–44)
AST: 24 U/L (ref 15–41)
Albumin: 3.9 g/dL (ref 3.5–5.0)
Alkaline Phosphatase: 74 U/L (ref 38–126)
Anion gap: 9 (ref 5–15)
BUN: 24 mg/dL — ABNORMAL HIGH (ref 8–23)
CO2: 30 mmol/L (ref 22–32)
Calcium: 8.9 mg/dL (ref 8.9–10.3)
Chloride: 101 mmol/L (ref 98–111)
Creatinine, Ser: 0.69 mg/dL (ref 0.44–1.00)
GFR, Estimated: >60 mL/min (ref >60)
Glucose, Bld: 114 mg/dL — ABNORMAL HIGH (ref 70–99)
Potassium: 3 mmol/L — ABNORMAL LOW (ref 3.5–5.1)
Sodium: 140 mmol/L (ref 135–145)
Total Bilirubin: 0.7 mg/dL (ref 0.3–1.2)
Total Protein: 6.6 g/dL (ref 6.5–8.1)

## 2020-11-04 LAB — ETHANOL: Alcohol, Ethyl (B): <10 mg/dL (ref <10)

## 2020-11-04 LAB — LIPASE, BLOOD: Lipase: 41 U/L (ref 11–51)

## 2020-11-04 LAB — RESP PANEL BY RT-PCR (FLU A&B, COVID) ARPGX2
Influenza A by PCR: NEGATIVE
Influenza B by PCR: NEGATIVE
SARS Coronavirus 2 by RT PCR: NEGATIVE

## 2020-11-04 MED ORDER — POTASSIUM CHLORIDE CRYS ER 20 MEQ PO TBCR
40.0000 meq | EXTENDED_RELEASE_TABLET | Freq: Once | ORAL | Status: AC
  Administered 2020-11-04: 40 meq via ORAL
  Filled 2020-11-04: qty 2

## 2020-11-04 MED ORDER — DOXEPIN HCL 50 MG PO CAPS
50.0000 mg | ORAL_CAPSULE | Freq: Every day | ORAL | Status: DC
  Administered 2020-11-05 – 2020-11-15 (×10): 50 mg via ORAL
  Filled 2020-11-04 (×11): qty 1

## 2020-11-04 MED ORDER — CARVEDILOL 3.125 MG PO TABS
6.2500 mg | ORAL_TABLET | Freq: Two times a day (BID) | ORAL | Status: DC
  Administered 2020-11-05 – 2020-11-14 (×17): 6.25 mg via ORAL
  Filled 2020-11-04 (×19): qty 2

## 2020-11-04 MED ORDER — TEMAZEPAM 7.5 MG PO CAPS
22.5000 mg | ORAL_CAPSULE | Freq: Every day | ORAL | Status: DC
  Administered 2020-11-05 – 2020-11-14 (×10): 22.5 mg via ORAL
  Filled 2020-11-04 (×10): qty 1

## 2020-11-04 NOTE — ED Provider Notes (Signed)
[place] DEPT Provider Note   CSN: [id number] Arrival date & time: 11/04/20  1349     History Chief Complaint  Patient presents with   Abdominal Pain    [name] is a 70 y.o. female.  This is a 70 year old female patient presented the ER secondary to Police Department.  Police were called to the motel the patient is staying at by the husband who is claiming that he is unable to take care of her.  Patient's spouse/caretaker was intoxicated per police on scene, suicidal.  Police reported that the patient's spouse had photos of her crawling around in the parking lot on her hands and knees, reports that she had not eaten over the past week has not been sleeping.  Possible hallucinations per spouse but he is intoxicated and cannot provide reliable history. Patient initially did report some abdominal discomfort upon police arrival which she now denies.  Patient currently has no acute complaints and is requesting to go home. She is Aox3.  Husband unable to provide history at bedside secondary to psychiatric issues  The history is provided by the patient and the police. No language interpreter was used.  Abdominal Pain Associated symptoms: no chest pain, no chills, no cough, no fever, no hematuria, no nausea, no shortness of breath and no vomiting       Past Medical History:  Diagnosis Date   Acute blood loss anemia    Arrhythmia    Arthritis    C. difficile colitis    Cancer ([place])    Diverticulosis    Fibromyalgia    GERD (gastroesophageal reflux disease)    GI hemorrhage    H/O: substance abuse ([place]) 2005   narcotic usage due to chronic back pain   Heart murmur    Hypertension    Pancreatic cyst    Pneumococcal meningitis    PONV (postoperative nausea and vomiting)    Renal cyst    Stroke (cerebrum) [facility])     Patient Active Problem List   Diagnosis Date Noted   Protein-calorie malnutrition, severe 08/03/2020   COPD with exacerbation  ([place]) 08/02/2020   Acute respiratory failure with hypoxia ([place]) 08/01/2020   Severe recurrent major depression with psychotic features ([place]) 07/03/2020   Hallucinations    Shortness of breath 04/20/2020   Chronic diastolic CHF (congestive heart failure) ([place]) 04/20/2020   Nausea & vomiting 04/20/2020   Prolonged QT interval 04/20/2020   COPD with acute exacerbation ([place]) 06/20/2017   DOE (dyspnea on exertion) 06/19/2017   Cervical dystonia 11/27/2016   C. difficile colitis 09/10/2016   Abdominal pain 09/08/2016   Acute encephalopathy    Fever    Left hemiparesis (HCC)    Generalized OA    Fibromyalgia    Tobacco abuse    Substance abuse ([place])    Benign essential HTN    Tachycardia    Chronic pain syndrome    Acute blood loss anemia    Leukocytosis    Septic thrombophlebitis of sagittal sinus    Acute respiratory failure (HCC)    Streptococcus pneumoniae meningitis    Streptococcal bacteremia    Meningitis    History of stroke 04/12/2016   Stroke (cerebrum) ([place]) 04/12/2016   Neck pain 04/04/2016   Chronic abdominal pain 02/19/2016   Essential hypertension 02/19/2016   Bacterial conjunctivitis 02/19/2016   Health care maintenance 02/19/2016   Anxiety 05/08/2006   Cigarette smoker 05/08/2006   ASTHMA, UNSPECIFIED 05/08/2006   GERD (gastroesophageal reflux disease)  05/08/2006   CONSTIPATION 05/08/2006   CONVULSIONS, SEIZURES, NOS 05/08/2006    Past Surgical History:  Procedure Laterality Date   ABDOMINAL HYSTERECTOMY     ABDOMINAL SURGERY     APPENDECTOMY     BIOPSY  04/22/2020   Procedure: BIOPSY;  Surgeon: Milus Banister, MD;  Location: [facility];  Service: Gastroenterology;;   BREAST SURGERY     CHOLECYSTECTOMY N/A 09/30/2012   Procedure: LAPAROSCOPIC CHOLECYSTECTOMY WITH INTRAOPERATIVE CHOLANGIOGRAM;  Surgeon: Imogene Burn. Georgette Dover, MD;  Location: [facility];  Service: General;  Laterality: N/A;   ESOPHAGOGASTRODUODENOSCOPY (EGD) WITH PROPOFOL N/A 04/22/2020    Procedure: ESOPHAGOGASTRODUODENOSCOPY (EGD) WITH PROPOFOL;  Surgeon: Milus Banister, MD;  Location: [facility];  Service: Gastroenterology;  Laterality: N/A;   FRACTURE SURGERY     right upper arm   OVARIAN CYST SURGERY       OB History   No obstetric history on file.     Family History  Problem Relation Age of Onset   Alzheimer's disease Mother    Heart disease Father    Prostate cancer Father     Social History   Tobacco Use   Smoking status: Every Day    Packs/day: 0.75    Years: 49.00    Pack years: 36.75    Types: Cigarettes   Smokeless tobacco: Never  Vaping Use   Vaping Use: Never used  Substance Use Topics   Alcohol use: No   Drug use: No    Comment: Methadone for narcotic use    Home Medications Prior to Admission medications   Medication Sig Start Date End Date Taking? Authorizing Provider  acetaminophen (TYLENOL) 500 MG tablet Take 1,000 mg by mouth every 6 (six) hours as needed for mild pain.    [provider]  buprenorphine (SUBUTEX) 2 MG SUBL SL tablet Place 2 mg under the tongue 3 (three) times daily. 04/15/20   [provider]  carvedilol (COREG) 6.25 MG tablet Take 6.25 mg by mouth 2 (two) times daily with a meal. 12/25/17   [provider]  doxepin (SINEQUAN) 50 MG capsule Take 50 mg by mouth daily. 10/11/20   [provider]  LORazepam (ATIVAN) 0.5 MG tablet Take 0.5 mg by mouth 2 (two) times daily as needed for anxiety. 09/15/20   [provider]  OLANZapine zydis (ZYPREXA) 10 MG disintegrating tablet Take 1 tablet (10 mg total) by mouth at bedtime for 10 days. Patient not taking: Reported on 10/21/2020 07/03/20 07/13/20  Luna Fuse, MD  temazepam (RESTORIL) 22.5 MG capsule Take 22.5 mg by mouth at bedtime. 12/02/17   [provider]    Allergies    Tylenol [acetaminophen], Codeine, Nsaids, Tolmetin, and Tramadol  Review of Systems   Review of Systems  Constitutional:  Positive for appetite  change. Negative for chills and fever.  HENT:  Negative for facial swelling and trouble swallowing.   Eyes:  Negative for photophobia and visual disturbance.  Respiratory:  Negative for cough and shortness of breath.   Cardiovascular:  Negative for chest pain and palpitations.  Gastrointestinal:  Positive for abdominal pain. Negative for nausea and vomiting.  Endocrine: Negative for polydipsia and polyuria.  Genitourinary:  Negative for difficulty urinating and hematuria.  Musculoskeletal:  Negative for gait problem and joint swelling.  Skin:  Negative for pallor and rash.  Neurological:  Negative for syncope and headaches.  Psychiatric/Behavioral:  Positive for behavioral problems and sleep disturbance. Negative for agitation and confusion.    Physical Exam  Updated Vital Signs BP (!) 136/99 (BP Location: Left Arm)   Pulse 74   Temp 98 F (36.7 C) (Oral)   Resp 15   Ht '5\' 4"'$  (1.626 m)   Wt 44.9 kg   SpO2 99%   BMI 16.99 kg/m   Physical Exam Vitals and nursing note reviewed.  Constitutional:      General: She is not in acute distress.    Appearance: Normal appearance.  HENT:     Head: Normocephalic and atraumatic.     Right Ear: External ear normal.     Left Ear: External ear normal.     Nose: Nose normal.     Mouth/Throat:     Mouth: Mucous membranes are moist.  Eyes:     General: No scleral icterus.       Right eye: No discharge.        Left eye: No discharge.     Extraocular Movements: Extraocular movements intact.     Pupils: Pupils are equal, round, and reactive to light.  Cardiovascular:     Rate and Rhythm: Normal rate and regular rhythm.     Pulses: Normal pulses.     Heart sounds: Normal heart sounds.  Pulmonary:     Effort: Pulmonary effort is normal. No respiratory distress.     Breath sounds: Normal breath sounds.  Abdominal:     General: Abdomen is flat.     Tenderness: There is no abdominal tenderness.  Musculoskeletal:        General: Normal range  of motion.     Cervical back: Normal range of motion.     Right lower leg: No edema.     Left lower leg: No edema.  Skin:    General: Skin is warm and dry.     Capillary Refill: Capillary refill takes less than 2 seconds.  Neurological:     Mental Status: She is alert and oriented to person, place, and time.     GCS: GCS eye subscore is 4. GCS verbal subscore is 5. GCS motor subscore is 6.     Cranial Nerves: Cranial nerves are intact.     Sensory: Sensation is intact.     Motor: Motor function is intact.     Coordination: Coordination is intact.     Comments: AOx3  Psychiatric:        Mood and Affect: Mood normal.        Behavior: Behavior normal.    ED Results / Procedures / Treatments   Labs (all labs ordered are listed, but only abnormal results are displayed) Labs Reviewed  COMPREHENSIVE METABOLIC PANEL - Abnormal; Notable for the following components:      Result Value   Potassium 3.0 (*)    Glucose, Bld 114 (*)    BUN 24 (*)    All other components within normal limits  RESP PANEL BY RT-PCR (FLU A&B, COVID) ARPGX2  ETHANOL  CBC WITH DIFFERENTIAL/PLATELET  LIPASE, BLOOD  RAPID URINE DRUG SCREEN, HOSP PERFORMED  URINALYSIS, ROUTINE W REFLEX MICROSCOPIC    EKG EKG Interpretation  Date/Time:  Saturday November 04 2020 14:36:32 EDT Ventricular Rate:  78 PR Interval:  109 QRS Duration: 99 QT Interval:  405 QTC Calculation: 462 R Axis:   32 Text Interpretation: Sinus rhythm Short PR interval Similar to prior ECG Confirmed by Wynona Dove ([id number]) on 11/04/2020 4:14:45 PM  Radiology No results found.  Procedures Procedures   Medications Ordered in ED Medications  carvedilol (COREG) tablet 6.25  mg (has no administration in time range)  temazepam (RESTORIL) capsule 22.5 mg (has no administration in time range)  doxepin (SINEQUAN) capsule 50 mg (has no administration in time range)  potassium chloride SA (KLOR-CON) CR tablet 40 mEq (40 mEq Oral Given 11/04/20 1925)     ED Course  I have reviewed the triage vital signs and the nursing notes.  Pertinent labs & imaging results that were available during my care of the patient were reviewed by me and considered in my medical decision making (see chart for details).    MDM Rules/Calculators/A&P                          70 year old female to the ER history as above.  Physical exam is reassuring.  Neuro exam is nonfocal.  AO x3.  She has no acute complaints this time.  Denies previously reported abdominal pain.  Vital signs reviewed and are stable.  Serious etiologies considered.  Patient with underlying dementia although she is AO x3 at this time.  Concern that she is not reliable historian.  Will obtain screening labs.  Consult with social work regarding housing issues.   4:01 PM Called to bedside as patient is attempted to elope from the emergency department.  Patient is unable to contract for her own safety, she is unable to describe why she wants to leave or the risks of potentially leaving prior to completion of evaluation.  Patient with documentation of underlying dementia.  At this time patient is not safe to leave the hospital, recommend psychiatric evaluation at this time.   Potassium mildly reduced, replaced orally.  Oral intake without difficulty. Labs reviewed and are otherwise stable.   ECG without acute ischemia, no STEMI.    Patient does not appear acutely psychotic at this time, she is not acutely suicidal.  Concern for ongoing psychiatric issues that are untreated at home.  Not taking her medications per spouse.  Recommend psychiatric consult at this time for further evaluation and likely require placement as primary caretaker is refusing to care for her. TTS and TOS consulted.   Pt signed out to incoming team at this time pending final disposition, TTS evaluation.    Final Clinical Impression(s) / ED Diagnoses Final diagnoses:  Hypokalemia  Mental health disorder  Noncompliance  with medication regimen    Rx / DC Orders ED Discharge Orders     None        Jeanell Sparrow, DO 11/04/20 2350

## 2020-11-04 NOTE — ED Notes (Signed)
Patient ambulated outside the EMS bay, MD Pearline Cables aware, states he is in the process of IVC'ing her.

## 2020-11-04 NOTE — Progress Notes (Signed)
[facility] called back they are currently on another call and will contact CSW in a hour. CSW is unsure if the report will be accepted.

## 2020-11-04 NOTE — ED Notes (Signed)
ED provider at bedside.

## 2020-11-04 NOTE — ED Notes (Signed)
Unable to provide urine specimen.  

## 2020-11-04 NOTE — Progress Notes (Signed)
[facility] was contacted. CSW left a message and is waiting for a return phone call back to make a report.

## 2020-11-04 NOTE — ED Notes (Addendum)
This nurse and ED Leith entered the pt's room after the bed alarm alarmed and pt was standing upright outside of ED stretcher. This nurse and Hoffman assisted the pt back into the ED stretcher. Pt remains attached to cardiac monitor x3 with bed alarm on and functional. ED charge nurse notified and pt to be transferred to hall bed within direct line of sight of nurses station.

## 2020-11-04 NOTE — ED Notes (Signed)
This nurse was walking toward hallway ED rooms 13-15 when pt was witnessed to be walking down hallway to double doors despite having been attached to cardiac monitor x3 and with bed alarm on in Bed 14. This nurse returned the pt to her room and re-attached her to the cardiac monitor and made sure bed alarm is on and functional. This nurse explained to the pt that she needs to stay in the ED stretcher to avoid injury.

## 2020-11-04 NOTE — ED Notes (Signed)
Pt continues to wonder hallway and has to be re-directed back to ED stretcher

## 2020-11-04 NOTE — BH Assessment (Signed)
@  1845 called RN regarding TTS consult request. Per RN, likely TOC placement case, but TTS called, as pt was IVC'd.  Confirmed with Jerene Pitch that TTS will need to assess, but likely psych clear for TOC to pursue placement.  RN states it will be 5-10 min to set up cart and get pt to a room for assessment.  RN informed that TTS will contact her at a later time when pt can be assessed.

## 2020-11-04 NOTE — ED Notes (Signed)
Patient returned to stretcher in hallway after walking out the EMS doors stating she wants to go home. After multiple coercion attempts, states she did not want any more treatment and wants to walk home. States she lives in a motel. Advised patient that I did not want her to get hit by a car, agreeable to come back into hospital at this time

## 2020-11-04 NOTE — Progress Notes (Signed)
CSW spoke with patient who stated she has been living in a motel with her husband for over a month. CSW asked what is the name of the motel and patient stated she does not know the name. CSW asked if she knows the name of the road its on and she also stated no. CSW asked patient if he husband is able to help her and she stated no not at this time. Patient stated her husband is going to detox. Patient stated her husband his drunk right now. CSW asked patient if she can cook and shop for her own food and she stated no. Patient denies having any PCP and stated she was living at the [facility] before the current motel she is in now. CSW asked patient if she has medicaid and she stated she has medicare. CSW asked patient what year it is and she stated its 2021. Patient was able to tell CSW its August but not the date. patient was able to tell CSW her date of birth. CSW asked patient if she was interested in living in a memory care and placement outside of her husband and she stated no. CSW asked the question again and patient does not want to be placed and leave her husband. Patient currently has no payor source. Patient will need medicaid for higher level of care. Patient has the option for private pay and pay out of pocket. Patient does not have the finances to do this. CSW asked patient for friends or family CSW could contact and she stated she did not have any.

## 2020-11-04 NOTE — ED Notes (Signed)
Pt dressed out into burgundy scrubs. Pt belongings include shirt, pants, and shoes. Placed in labeled belongings bag, placed in pt cabinets at nursing station closest to bed assignment. RN advised. [employer]

## 2020-11-04 NOTE — ED Notes (Signed)
Labeled urine specimen and culture sent to lab. [place] 

## 2020-11-04 NOTE — ED Triage Notes (Signed)
Pt BIB [facility] with husband. Pt has dementia, but endorses some abdominal pain. Pt has no one at home to properly care for her.

## 2020-11-05 LAB — RAPID URINE DRUG SCREEN, HOSP PERFORMED
Amphetamines: NOT DETECTED
Barbiturates: NOT DETECTED
Benzodiazepines: NOT DETECTED
Cocaine: NOT DETECTED
Opiates: NOT DETECTED
Tetrahydrocannabinol: NOT DETECTED

## 2020-11-05 LAB — URINALYSIS, ROUTINE W REFLEX MICROSCOPIC
Bilirubin Urine: NEGATIVE
Glucose, UA: NEGATIVE mg/dL
Hgb urine dipstick: NEGATIVE
Ketones, ur: 5 mg/dL — AB
Leukocytes,Ua: NEGATIVE
Nitrite: NEGATIVE
Protein, ur: NEGATIVE mg/dL
Specific Gravity, Urine: 1.021 (ref 1.005–1.030)
pH: 6 (ref 5.0–8.0)

## 2020-11-05 MED ORDER — LORAZEPAM 0.5 MG PO TABS
0.5000 mg | ORAL_TABLET | Freq: Once | ORAL | Status: DC
  Filled 2020-11-05: qty 1

## 2020-11-05 MED ORDER — ACETAMINOPHEN 325 MG PO TABS
650.0000 mg | ORAL_TABLET | Freq: Once | ORAL | Status: AC
Start: 2020-11-05 — End: 2020-11-07
  Administered 2020-11-07: 650 mg via ORAL
  Filled 2020-11-05: qty 2

## 2020-11-05 NOTE — BH Assessment (Signed)
Comprehensive Clinical Assessment (CCA) Note  11/05/2020 KALIOPE STAIR AM:[id number]  DISPOSITION: Gave clinical report to Quintella Reichert, NP who determined Pt meets criteria for inpatient geriatric-psychiatry. Appropriate facilities will be contacted for placement. Notified Dr. Addison Lank, Elwin Mocha, RN and Burnis Medin, RN of recommendation via secure message.  The patient demonstrates the following risk factors for suicide: Chronic risk factors for suicide include: psychiatric disorder of major depressive disorder, previous suicide attempts by strangulation, and medical illness COPD . Acute risk factors for suicide include: social withdrawal/isolation. Protective factors for this patient include: positive social support and responsibility to others (children, family). Considering these factors, the overall suicide risk at this point appears to be low. Patient is appropriate for outpatient follow up.  Flowsheet Row ED from 11/04/2020 in [facility] DEPT ED from 10/30/2020 in [facility] DEPT ED from 10/20/2020 in [facility] No Risk No Risk Error: Question 2 not populated      Pt is a 70 year old married female who presents to [facility] ED voluntarily via Event organiser. Pt reports she and her husband are residing in a motel and Event organiser came because her husband was intoxicated. She says they also brought her in for evaluation. Pt's husband, Connye Burkitt, reports Pt has dementia, has been unable to care for herself. Police reported that the Pt's husband had photos of her crawling around in the parking lot on her hands and knees, reports that she had not eaten over the past week, and has not been sleeping. Pt acknowledges that she has not been eating. She says she sleeps 2-3 hours per night and is restless. She reports crying spells, loss of interest in usual pleasures,  fatigue and feelings of hopelessness. She denies current suicidal ideation and says she attempted suicide approximately 15 years ago by trying to strangle herself. Pt says she hears voices telling her about God. She says, "I am going to die tomorrow at 12:01 am." When asked how she knew that, Pt replies "Jesus, told me." When asked if she experiences visual hallucinations, Pt pauses for a low time then replies "no." Pt denies homicidal ideation or history of aggression. She denies alcohol or other substance use.  Pt identifies her belief that she is dying tomorrow as her primary stressor. She says her husband is an alcoholic and she does not identify him as a primary support. She states she has a son and a daughter who live in [place], [place] and says they are supportive. Pt denies history of abuse. She denies legal problems. She denies access to firearms.  Pt says she has no outpatient mental health providers. She says she has been prescribed psychiatric medications in the past but is not currently taking mental health medications. She says she has been psychiatrically hospitalized in the past but cannot remember where or when. Pt's medical record indicates Pt has presented several times to local EDs with altered mental status, hallucinations, and acute psychosis. Dementia is not listed in Pt's recent medical history.  ED notes indicate Pt has seen social work while in the ED. CSW asked patient if she can cook and shop for her own food and she stated no. CSW submitted APS report.  Pt is dressed in hospital scrubs, alert and oriented to person, place, and situation. Pt states the year is 2021 and the month is August but does not know the date. Pt speaks in a soft tone,  at low volume and slow pace. Motor behavior appears normal. Eye contact is minimal. Pt's mood is depressed and anxious, affect is congruent with mood. Thought process is coherent and at times circumstantial. Pt's insight is limited and judgment is  poor. Pt was cooperative throughout assessment. She says she would like pain medication because the left side of her torso is sore.   Chief Complaint:  Chief Complaint  Patient presents with   Abdominal Pain   Visit Diagnosis: F33.3 Major depressive disorder, Recurrent episode, With psychotic features   CCA Screening, Triage and Referral (STR)  Patient Reported Information How did you hear about [place]? Other (Comment) Risk manager)  Referral name: No data recorded Referral phone number: No data recorded  Whom do you see for routine medical problems? I don't have a doctor  Practice/Facility Name: No data recorded Practice/Facility Phone Number: No data recorded Name of Contact: No data recorded Contact Number: No data recorded Contact Fax Number: No data recorded Prescriber Name: No data recorded Prescriber Address (if known): No data recorded  What Is the Reason for Your Visit/Call Today? Pt was brought to ED by law enforcement due to husband's concern Pt has dementia and has been crawling around in the parking lot of the motel where they are residing. Pt reports hearing voice of God and says Jesus told her she is going to die at 1201 am tomorrow.  How Long Has This Been Causing You Problems? > than 6 months  What Do You Feel Would Help You the Most Today? Treatment for Depression or other mood problem; Medication(s)   Have You Recently Been in Any Inpatient Treatment (Hospital/Detox/Crisis Center/28-Day Program)? Yes  Name/Location of Program/Hospital:[facility]  How Long Were You There? UTA  When Were You Discharged? No data recorded  Have You Ever Received Services From [facility] Before? No  Who Do You See at [facility]? No data recorded  Have You Recently Had Any Thoughts About Hurting Yourself? No  Are You Planning to Commit Suicide/Harm Yourself At This time? No   Have you Recently Had Thoughts About [place]? No  Explanation: No data  recorded  Have You Used Any Alcohol or Drugs in the Past 24 Hours? No  How Long Ago Did You Use Drugs or Alcohol? No data recorded What Did You Use and How Much? No data recorded  Do You Currently Have a Therapist/Psychiatrist? No  Name of Therapist/Psychiatrist: No data recorded  Have You Been Recently Discharged From Any Office Practice or Programs? No  Explanation of Discharge From Practice/Program: No data recorded    CCA Screening Triage Referral Assessment Type of Contact: Tele-Assessment  Is this Initial or Reassessment? Initial Assessment  Date Telepsych consult ordered in [facility]:  11/04/20  Time Telepsych consult ordered in [facility]:  1612   Patient Reported Information Reviewed? No data recorded Patient Left Without Being Seen? No data recorded Reason for Not Completing Assessment: No data recorded  Collateral Involvement: Husband: Connye Burkitt   Does Patient Have a Stage manager Guardian? No data recorded Name and Contact of Legal Guardian: No data recorded If Minor and Not Living with Parent(s), Who has Custody? NA  Is CPS involved or ever been involved? Never  Is APS involved or ever been involved? Never   Patient Determined To Be At Risk for Harm To Self or Others Based on Review of Patient Reported Information or Presenting Complaint? Yes, for Self-Harm  Method: No data recorded Availability of Means: No data recorded  Intent: No data recorded Notification Required: No data recorded Additional Information for Danger to Others Potential: No data recorded Additional Comments for Danger to Others Potential: No data recorded Are There Guns or Other Weapons in Your Home? No data recorded Types of Guns/Weapons: No data recorded Are These Weapons Safely Secured?                            No data recorded Who Could Verify You Are Able To Have These Secured: No data recorded Do You Have any Outstanding Charges, Pending Court Dates, Parole/Probation? No data  recorded Contacted To Inform of Risk of Harm To Self or Others: Family/Significant Other:   Location of Assessment: [facility]   Does Patient Present under Involuntary Commitment? No  IVC Papers Initial File Date: No data recorded  [place] of Residence: [place]   Patient Currently Receiving the Following Services: Not Receiving Services   Determination of Need: Urgent (48 hours)   Options For Referral: Inpatient Hospitalization; Outpatient Therapy; Medication Management     CCA Biopsychosocial Intake/Chief Complaint:  Pt BIBA from home for a psych eval per son. Son is stating patient is having hallucinations, wandering around the house and saying the devil is coming to get her. Pt has complaints of head pain and HTN, BP 156/102. Pt recently dc'd from a psych facility in [place]. Pt calm and cooperative in triage. Denies SI/HI. GCS 15.  Current Symptoms/Problems: bad headache   Patient Reported Schizophrenia/Schizoaffective Diagnosis in Past: No   Strengths: Patient is able to communicate  Preferences: No data recorded Abilities: No data recorded  Type of Services Patient Feels are Needed: No data recorded  Initial Clinical Notes/Concerns: No data recorded  Mental Health Symptoms Depression:   Tearfulness; Change in energy/activity; Difficulty Concentrating; Increase/decrease in appetite; Sleep (too much or little); Hopelessness   Duration of Depressive symptoms:  Greater than two weeks   Mania:   None   Anxiety:    Worrying; Tension; Sleep; Difficulty concentrating; Fatigue   Psychosis:   Hallucinations   Duration of Psychotic symptoms:  Less than six months   Trauma:   None   Obsessions:   None   Compulsions:   None   Inattention:   N/A   Hyperactivity/Impulsivity:   N/A   Oppositional/Defiant Behaviors:   N/A   Emotional Irregularity:   None   Other Mood/Personality Symptoms:   NA    Mental Status Exam Appearance and self-care   Stature:   Average   Weight:   Average weight   Clothing:   Casual   Grooming:   Neglected   Cosmetic use:   None   Posture/gait:   Slumped   Motor activity:   Not Remarkable   Sensorium  Attention:   Inattentive   Concentration:   Variable   Orientation:   Place; Person; Situation   Recall/memory:   Normal   Affect and Mood  Affect:   Blunted; Anxious   Mood:   Anxious   Relating  Eye contact:   Fleeting   Facial expression:   Constricted   Attitude toward examiner:   Cooperative   Thought and Language  Speech flow:  Soft; Slow   Thought content:   Appropriate to Mood and Circumstances   Preoccupation:   None   Hallucinations:   Auditory   Organization:  No data recorded  [employer] of Knowledge:   Fair   Intelligence:   Average  Abstraction:   Normal   Judgement:   Impaired   Reality Testing:   Distorted   Insight:   Flashes of insight; Lacking   Decision Making:   Impulsive   Social Functioning  Social Maturity:   Impulsive   Social Judgement:   Heedless   Stress  Stressors:   Relationship; Housing   Coping Ability:   Overwhelmed   Skill Deficits:   Self-care   Supports:   Family     Religion: Religion/Spirituality Are You A Religious Person?: No  Leisure/Recreation: Leisure / Recreation Do You Have Hobbies?: Yes Leisure and Hobbies: Water skiing  Exercise/Diet: Exercise/Diet Do You Exercise?: No Have You Gained or Lost A Significant Amount of Weight in the Past Six Months?: No Do You Follow a Special Diet?: No Do You Have Any Trouble Sleeping?: Yes Explanation of Sleeping Difficulties: Pt reports sleeping 3-4 hours per night   CCA Employment/Education Employment/Work Situation: Employment / Work Situation Employment Situation: On disability Why is Patient on Disability: Medical reasons How Long has Patient Been on Disability: Unknown Patient's Job has Been  Impacted by Current Illness: No Has Patient ever Been in the [employer]?: No  Education: Education Is Patient Currently Attending School?: No Did Physicist, medical?: No Did You Have An Individualized Education Program (IIEP): No Did You Have Any Difficulty At School?: No Patient's Education Has Been Impacted by Current Illness: No   CCA Family/Childhood History Family and Relationship History: Family history Marital status: Married Does patient have children?: Yes How many children?: 2 How is patient's relationship with their children?: Pt reports she has a son and a daughter who both live in [place].  Childhood History:  Childhood History Did patient suffer any verbal/emotional/physical/sexual abuse as a child?: No Did patient suffer from severe childhood neglect?: No Has patient ever been sexually abused/assaulted/raped as an adolescent or adult?: No Was the patient ever a victim of a crime or a disaster?: No Witnessed domestic violence?: No Has patient been affected by domestic violence as an adult?: No  Child/Adolescent Assessment:     CCA Substance Use Alcohol/Drug Use: Alcohol / Drug Use Pain Medications: See MAR Prescriptions: See MAR Over the Counter: See MAR History of alcohol / drug use?: No history of alcohol / drug abuse Longest period of sobriety (when/how long): NA                         ASAM's:  Six Dimensions of Multidimensional Assessment  Dimension 1:  Acute Intoxication and/or Withdrawal Potential:      Dimension 2:  Biomedical Conditions and Complications:      Dimension 3:  Emotional, Behavioral, or Cognitive Conditions and Complications:     Dimension 4:  Readiness to Change:     Dimension 5:  Relapse, Continued use, or Continued Problem Potential:     Dimension 6:  Recovery/Living Environment:     ASAM Severity Score:    ASAM Recommended Level of Treatment:     Substance use Disorder (SUD)    Recommendations for  Services/Supports/Treatments:    DSM5 Diagnoses: Patient Active Problem List   Diagnosis Date Noted   Protein-calorie malnutrition, severe 08/03/2020   COPD with exacerbation ([facility]) 08/02/2020   Acute respiratory failure with hypoxia ([place]) 08/01/2020   Severe recurrent major depression with psychotic features ([place]) 07/03/2020   Hallucinations    Shortness of breath 04/20/2020   Chronic diastolic CHF (congestive heart failure) ([place]) 04/20/2020   Nausea & vomiting 04/20/2020  Prolonged QT interval 04/20/2020   COPD with acute exacerbation ([place]) 06/20/2017   DOE (dyspnea on exertion) 06/19/2017   Cervical dystonia 11/27/2016   C. difficile colitis 09/10/2016   Abdominal pain 09/08/2016   Acute encephalopathy    Fever    Left hemiparesis (HCC)    Generalized OA    Fibromyalgia    Tobacco abuse    Substance abuse (HCC)    Benign essential HTN    Tachycardia    Chronic pain syndrome    Acute blood loss anemia    Leukocytosis    Septic thrombophlebitis of sagittal sinus    Acute respiratory failure (HCC)    Streptococcus pneumoniae meningitis    Streptococcal bacteremia    Meningitis    History of stroke 04/12/2016   Stroke (cerebrum) ([place]) 04/12/2016   Neck pain 04/04/2016   Chronic abdominal pain 02/19/2016   Essential hypertension 02/19/2016   Bacterial conjunctivitis 02/19/2016   Health care maintenance 02/19/2016   Anxiety 05/08/2006   Cigarette smoker 05/08/2006   ASTHMA, UNSPECIFIED 05/08/2006   GERD (gastroesophageal reflux disease) 05/08/2006   CONSTIPATION 05/08/2006   CONVULSIONS, SEIZURES, NOS 05/08/2006    Patient Centered Plan: Patient is on the following Treatment Plan(s):  Anxiety and Depression   Referrals to Alternative Service(s): Referred to Alternative Service(s):   Place:   Date:   Time:    Referred to Alternative Service(s):   Place:   Date:   Time:    Referred to Alternative Service(s):   Place:   Date:   Time:    Referred to Alternative  Service(s):   Place:   Date:   Time:     Evelena Peat, [facility]

## 2020-11-05 NOTE — ED Notes (Signed)
Patient is awake. Patient was began pacing the floor for a few minutes. Patient was redirected and encouraged to walk in front of her bed.

## 2020-11-05 NOTE — ED Notes (Signed)
Patient requesting pain medication for her side/ribs area. Dr. Leonette Monarch ordering medication.

## 2020-11-05 NOTE — ED Notes (Signed)
TTS in progress 

## 2020-11-05 NOTE — ED Notes (Signed)
TTS completed. Quintella Reichert, NP recommends inpatient geriatric psychiatry. Facilities will be contacted for placement.

## 2020-11-05 NOTE — ED Notes (Signed)
Patient is eating.

## 2020-11-05 NOTE — ED Notes (Signed)
Patient was given medications. Patient was also given dinner meal tray, however has not eaten it yet

## 2020-11-05 NOTE — Progress Notes (Signed)
Pt recommended for geriatric psychiatry services.   Patient meets inpatient criteria per Quintella Reichert, NP. Patient referred to the following facilities:  Destination Service Provider Request Status Address Phone Fax  [facility], [place] [phone]  [facility]  Pending - Request Sent [place], [place] [address] [phone]  [place], [place] [address] [phone]  [facility]  Pending - Request Sent [address] [place] [address] [phone]  [place] [facility]  Pending - Request Sent [address] [place] [address] [phone] [place], [place] [address] [phone] [place] [address], [place] [address] [phone]    CSW will continue to monitor disposition.   Signed:  Durenda Hurt, MSW, [place], LCASA 11/05/2020 10:37 AM

## 2020-11-05 NOTE — ED Notes (Signed)
Patient calm, sleeping, cooperative.

## 2020-11-05 NOTE — ED Notes (Signed)
TTS at bedside. 

## 2020-11-05 NOTE — ED Notes (Signed)
Patient cooperative at this time, occasionally wandering the halls. Given comb, toothbrush, toothpaste and non-alcoholic mouthwash for patient to clean herself up.

## 2020-11-06 MED ORDER — POTASSIUM CHLORIDE CRYS ER 20 MEQ PO TBCR
40.0000 meq | EXTENDED_RELEASE_TABLET | Freq: Once | ORAL | Status: DC
  Filled 2020-11-06: qty 2

## 2020-11-06 NOTE — BH Assessment (Addendum)
[facility] Assessment Progress Note   Per Pecolia Ades MD, this pt does not require psychiatric hospitalization at this time.  Pt presents under IVC initiated by EDP Wynona Dove, MD which has been rescinded by Hampton Abbot, MD.  Pt is psychiatrically cleared.  No behavioral health referrals are indicated for this pt at this time.  A TOC consult has been ordered to address pt's psychosocial needs.  EDP's Dorie Rank, MD and Aletta Edouard, MD have been notified, as well as Lu Duffel, LCSW and pt's nurse, Lovena Le.  Jalene Mullet, [place] Triage Specialist [phone]

## 2020-11-06 NOTE — Progress Notes (Signed)
[facility] contacted CSW back to let her know they received her call on 11/04/20 but the report was not entered. CSW provided patients information again. CSW was told they would call CSW if report is accepted after the supervisor reviews.

## 2020-11-06 NOTE — Progress Notes (Signed)
CSW contacted APS to make sure report was received.

## 2020-11-06 NOTE — ED Notes (Signed)
Patient refused all meds

## 2020-11-06 NOTE — ED Notes (Signed)
Patient ate dinner and is now laying in bed. Patient is more talkative today.

## 2020-11-06 NOTE — ED Provider Notes (Addendum)
Emergency Medicine Observation Re-evaluation Note  [name] is a 70 y.o. female, seen on rounds today.  Pt initially presented to the ED for complaints of Abdominal Pain Currently, the patient is waiting for inpatient treatment.  Physical Exam  BP 136/76 (BP Location: Left Arm)   Pulse 64   Temp 97.9 F (36.6 C) (Oral)   Resp 18   Ht 1.626 m ('5\' 4"'$ )   Wt 44.9 kg   SpO2 96%   BMI 16.99 kg/m  Physical Exam General: Calm resting Cardiac: Regular rate Lungs: Breathing easily   ED Course / MDM  EKG:EKG Interpretation  Date/Time:  Saturday November 04 2020 14:36:32 EDT Ventricular Rate:  78 PR Interval:  109 QRS Duration: 99 QT Interval:  405 QTC Calculation: 462 R Axis:   32 Text Interpretation: Sinus rhythm Short PR interval Similar to prior ECG Confirmed by Wynona Dove ([id number]) on 11/04/2020 4:14:45 PM  I have reviewed the labs performed to date as well as medications administered while in observation.  Recent changes in the last 24 hours include no changes.  Plan  Current plan is for waiting for inpatient psychiatric treatment.  Dimitri R Klingler is not under involuntary commitment.     Dorie Rank, MD 11/06/20 [phone] Notified by NP Victoriano Lain, pt is cleared for discharge and outpt treatment.  Will wait for TTS possible placement   Dorie Rank, MD 11/06/20 1229

## 2020-11-06 NOTE — Progress Notes (Signed)
APS accepted case. [name] is assigned. Her number is [phone]. Ms. [name] stated she plans on visiting patient tomorrow morning if she isn't discharged by then.

## 2020-11-06 NOTE — ED Notes (Signed)
Patient is asleep.  

## 2020-11-06 NOTE — Progress Notes (Signed)
CSW attempted to contact pt's husband Hiram Comber Tutterow, no response left VM.  Arlie Solomons.Kyon Bentler, MSW, [place]  Transitions of Care Clinical Social Worker I Direct Dial: [phone]  Fax: [phone] [email or web address]

## 2020-11-06 NOTE — Consult Note (Signed)
[name] is 70 y.o female seen face to face by t his provider at [facility]. Patient is lying on stretcher in hallway. Refuses to speak with provider, "I don't want to talk, I just don't". TOC  consult already placed, husband unable to care for her needs.   Psychiatry will sign off and psych clear this patient who needs higher level of care. She will not benefit from inpatient psychiatric care. CSW already aware of her needs.

## 2020-11-06 NOTE — ED Notes (Signed)
Patient tolerated taking medication well.

## 2020-11-06 NOTE — Progress Notes (Signed)
CSW spoke with husband who reported, hi and his wife was staying at a motel [address]. He stated they ran out of money since the motel is 550 a week. Pt's husband stated the pt gets a 1200 dollar SSN check. CSW informed pt's husband that pt is ready for d/c , her stated he has no where to take pt, he stated all the cheaper motels are fully booked. Pt's husband then stated he will come pick up pt if we can provide him transportation from the [employer] on [address] to the hospital. Pt husband stated he will pick pt up to go live in their car. CSW informed pt's husband pt living in a car is not a safe d/c , and asked if he would be able to find another place for pt to go.    [facility], stated they may have opening for pt, however pt will need to apply for Special Assistance Medicaid to be placement at facility. The facility's owner stated he would like to come visit pt tomorrow before pt is d/c    DSS was contacted to start Special Assistance Medicaid application for placement. Pt will continue to board until a safe d/c is available.     Pt's APS worker Samule Ohm will come see pt tomorrow and has been updated about situation.      Arlie Solomons.Bennetta Rudden, MSW, [place]  Transitions of Care Clinical Social Worker I Direct Dial: [phone]  Fax: [phone] [email or web address]

## 2020-11-07 NOTE — ED Notes (Signed)
Patient was awoken by another patient. Patient states her back hurts. Tylenol was given.

## 2020-11-07 NOTE — ED Notes (Signed)
This nurse spoke with Andee Poles with [facility]. Andee Poles here in the ED to assess pt. [place] supervisor, Jewel Baize to speak with social work regarding placing pt at facility upon approval of Medicaid application.   Kiona Brimm:  [phone]

## 2020-11-07 NOTE — ED Notes (Signed)
Pt resting quietly with eyes closed. In direct line of sight of nurses station with sitter that the bedside. 1:1.

## 2020-11-07 NOTE — ED Notes (Signed)
Patient is asleep.  

## 2020-11-07 NOTE — Progress Notes (Signed)
CSW spoke with pt's Son Elberta Fortis Bunting([phone]) , he stated he is unable to pick up pt upon d/c. He stated" I just cant let her live in my home"  pt's son stated he will help to start application for Special Assistance Medicaid tomorrow. APS worker Gaylord Shih was notified and provided contact information.CSW has not spoken with pt's husband due to him currently being a pt at [facility].  Arlie Solomons.Liev Brockbank, MSW, [place]  Transitions of Care Clinical Social Worker I Direct Dial: [phone]  Fax: [phone] [email or web address]

## 2020-11-07 NOTE — ED Notes (Signed)
Gaylord Shih with APS at the bedside to assess pt.

## 2020-11-08 NOTE — ED Provider Notes (Signed)
Emergency Medicine Observation Re-evaluation Note  [name] is a 70 y.o. female, seen on rounds today.  Pt initially presented to the ED for complaints of Abdominal Pain Currently, the patient is resting.  Physical Exam  BP (!) 156/74 (BP Location: Right Arm)   Pulse 63   Temp 98.1 F (36.7 C) (Oral)   Resp 16   Ht '5\' 4"'$  (1.626 m)   Wt 44.9 kg   SpO2 99%   BMI 16.99 kg/m  Physical Exam General: resting in NAD Lungs: Even and unlabored Psych: No distress, calm  ED Course / MDM  EKG:EKG Interpretation  Date/Time:  Saturday November 04 2020 14:36:32 EDT Ventricular Rate:  78 PR Interval:  109 QRS Duration: 99 QT Interval:  405 QTC Calculation: 462 R Axis:   32 Text Interpretation: Sinus rhythm Short PR interval Similar to prior ECG Confirmed by Wynona Dove ([id number]) on 11/04/2020 4:14:45 PM  I have reviewed the labs performed to date as well as medications administered while in observation.  Recent changes in the last 24 hours include: Pt IVC rescinded, psychiatrically cleared. TOC consult placed.   Social work note: "Per APS CSW Samule Ohm, screening complete with the recommendation to pursue guardianship of patient.  Camilla plans to prepare documents to file Camila reports she has also spoken with patient's son who states he is open to becoming legal guardian but cannot care for patient in the home.     [facility] visited with patient yesterday, and state they have an opening at their facility for 11/09/2020, however will need patient's check signed over for payment of services prior to acceptance."  Plan  Current plan is for Pending social work for safe dispo.  Sanjuana R Gammon is not under involuntary commitment.     Regan Lemming, MD 11/08/20 [phone]

## 2020-11-08 NOTE — Progress Notes (Signed)
Have spoken with [facility] rep and confirmed plan to accept patient.  Have notified that APS worker plans to start special assistance medicaid application tomorrow [id number].  Patients debit card to which her check is deposited is with patient in her room, plan is for this to be handed over to facility at discharge.    Per [facility] rep, plan is for admission on 11/10/2020.

## 2020-11-08 NOTE — ED Notes (Signed)
Patient provided with Ham sandwich per request

## 2020-11-08 NOTE — Progress Notes (Signed)
Per APS CSW Samule Ohm, screening complete with the recommendation to pursue guardianship of patient.  [name] plans to prepare documents to file [name] reports she has also spoken with patient's son who states he is open to becoming legal guardian but cannot care for patient in the home.    [facility] staff visited with patient yesterday, and state they have an opening at their facility for 11/09/2020, however will need patient's check signed over for payment of services prior to acceptance.

## 2020-11-08 NOTE — ED Notes (Signed)
Patient to nursing station requesting "something different to eat."  States "I can't eat what they gave me."  Patient offered a sandwich.  Provided sandwich.  Patient returned sandwich box to RN and stated "Is this [place]?  I can't eat [place]."

## 2020-11-09 LAB — POC SARS CORONAVIRUS 2 AG -  ED: SARSCOV2ONAVIRUS 2 AG: NEGATIVE

## 2020-11-09 MED ORDER — LORAZEPAM 0.5 MG PO TABS
0.5000 mg | ORAL_TABLET | Freq: Once | ORAL | Status: AC
  Administered 2020-11-09: 0.5 mg via ORAL

## 2020-11-09 NOTE — ED Provider Notes (Signed)
Emergency Medicine Observation Re-evaluation Note  [name] is a 70 y.o. female, seen on rounds today.  Pt initially presented to the ED for complaints of Abdominal Pain Currently, the patient is nothing.  Physical Exam  BP 120/69 (BP Location: Left Arm)   Pulse 79   Temp 98 F (36.7 C) (Oral)   Resp 18   Ht '5\' 4"'$  (1.626 m)   Wt 44.9 kg   SpO2 100%   BMI 16.99 kg/m  Physical Exam General: awake  Psych: calm  ED Course / MDM  EKG:EKG Interpretation  Date/Time:  Saturday November 04 2020 14:36:32 EDT Ventricular Rate:  78 PR Interval:  109 QRS Duration: 99 QT Interval:  405 QTC Calculation: 462 R Axis:   32 Text Interpretation: Sinus rhythm Short PR interval Similar to prior ECG Confirmed by Wynona Dove ([id number]) on 11/04/2020 4:14:45 PM  I have reviewed the labs performed to date as well as medications administered while in observation.  Recent changes in the last 24 hours include nothing.  Plan  Current plan is for same.  [name] is not under involuntary commitment.     Lennice Sites, DO 11/09/20 [phone]

## 2020-11-09 NOTE — Progress Notes (Signed)
FL2 submitted to facility per request.  Patient will need a TB test prior to admission.  Provider notified.

## 2020-11-09 NOTE — Progress Notes (Signed)
Special assistance medicaid application started, pending approval.  Reference number [id number].

## 2020-11-09 NOTE — ED Notes (Signed)
Patient provided with coffee per request.

## 2020-11-09 NOTE — ED Notes (Signed)
Pt showered, changed scrubs.

## 2020-11-09 NOTE — Progress Notes (Signed)
CSW spoke with Pt son Elberta Fortis , who stated he will come to see pt today. He stated he is willing to use pt's debit card to pay facility. Per APS worker Carloyn Manner , will start Medicaid Application today.   Arlie Solomons.Halli Equihua, MSW, [place]  Transitions of Care Clinical Social Worker I Direct Dial: [phone]  Fax: [phone] [email or web address]

## 2020-11-09 NOTE — NC FL2 (Signed)
[place] LEVEL OF CARE SCREENING TOOL     IDENTIFICATION  Patient Name: [name] Birthdate: Feb 01, 1951 Sex: female Admission Date (Current Location): 11/04/2020  [facility] and [place] Number:  Herbalist and Address:  [facility],  [place], [place]      Provider Number: [id number]  Attending Physician Name and Address:  Default, Provider, MD  Relative Name and Phone Number:       Current Level of Care: Hospital Recommended Level of Care: [place] Prior Approval Number:    Date Approved/Denied:   PASRR Number:    Discharge Plan: Other (Comment) ([place])    Current Diagnoses: Patient Active Problem List   Diagnosis Date Noted   Protein-calorie malnutrition, severe 08/03/2020   COPD with exacerbation ([place]) 08/02/2020   Acute respiratory failure with hypoxia ([place]) 08/01/2020   Severe recurrent major depression with psychotic features ([place]) 07/03/2020   Hallucinations    Shortness of breath 04/20/2020   Chronic diastolic CHF (congestive heart failure) ([place]) 04/20/2020   Nausea & vomiting 04/20/2020   Prolonged QT interval 04/20/2020   COPD with acute exacerbation ([place]) 06/20/2017   DOE (dyspnea on exertion) 06/19/2017   Cervical dystonia 11/27/2016   C. difficile colitis 09/10/2016   Abdominal pain 09/08/2016   Acute encephalopathy    Fever    Left hemiparesis (HCC)    Generalized OA    Fibromyalgia    Tobacco abuse    Substance abuse (HCC)    Benign essential HTN    Tachycardia    Chronic pain syndrome    Acute blood loss anemia    Leukocytosis    Septic thrombophlebitis of sagittal sinus    Acute respiratory failure (HCC)    Streptococcus pneumoniae meningitis    Streptococcal bacteremia    Meningitis    History of stroke 04/12/2016   Stroke (cerebrum) ([place]) 04/12/2016   Neck pain 04/04/2016   Chronic abdominal pain 02/19/2016   Essential hypertension  02/19/2016   Bacterial conjunctivitis 02/19/2016   Health care maintenance 02/19/2016   Anxiety 05/08/2006   Cigarette smoker 05/08/2006   ASTHMA, UNSPECIFIED 05/08/2006   GERD (gastroesophageal reflux disease) 05/08/2006   CONSTIPATION 05/08/2006   CONVULSIONS, SEIZURES, NOS 05/08/2006    Orientation RESPIRATION BLADDER Height & Weight     Self, Place, Situation  Normal Continent Weight: 98 lb 15.8 oz (44.9 kg) Height:  '5\' 4"'$  (162.6 cm)  BEHAVIORAL SYMPTOMS/MOOD NEUROLOGICAL BOWEL NUTRITION STATUS  Wanderer   Continent Diet (Regular)  AMBULATORY STATUS COMMUNICATION OF NEEDS Skin   Independent Verbally Normal                       Personal Care Assistance Level of Assistance  Bathing, Feeding, Dressing Bathing Assistance: Independent Feeding assistance: Independent Dressing Assistance: Independent     Functional Limitations Info  Sight, Hearing, Speech Sight Info: Adequate Hearing Info: Adequate Speech Info: Adequate    SPECIAL CARE FACTORS FREQUENCY                       Contractures Contractures Info: Not present    Additional Factors Info  Code Status, Allergies Code Status Info: full Allergies Info: Tylenol (acetaminophen) , Codeine, Nsaids, Tolmetin, Tramadol           Current Medications (11/09/2020):  This is the current hospital active medication list Current Facility-Administered Medications  Medication Dose Route Frequency Provider Last Rate Last Admin  carvedilol (COREG) tablet 6.25 mg  6.25 mg Oral BID WC Wynona Dove A, DO   6.25 mg at 11/09/20 0848   doxepin (SINEQUAN) capsule 50 mg  50 mg Oral Daily Wynona Dove A, DO   50 mg at 11/09/20 [id number]   LORazepam (ATIVAN) tablet 0.5 mg  0.5 mg Oral Once Truddie Hidden, MD       potassium chloride SA (KLOR-CON) CR tablet 40 mEq  40 mEq Oral Once Dorie Rank, MD       temazepam (RESTORIL) capsule 22.5 mg  22.5 mg Oral QHS Wynona Dove A, DO   22.5 mg at 11/08/20 2203   Current Outpatient  Medications  Medication Sig Dispense Refill   acetaminophen (TYLENOL) 500 MG tablet Take 1,000 mg by mouth every 6 (six) hours as needed for mild pain.     buprenorphine (SUBUTEX) 2 MG SUBL SL tablet Place 2 mg under the tongue 3 (three) times daily.     carvedilol (COREG) 6.25 MG tablet Take 6.25 mg by mouth 2 (two) times daily with a meal.  6   doxepin (SINEQUAN) 50 MG capsule Take 50 mg by mouth daily.     LORazepam (ATIVAN) 0.5 MG tablet Take 0.5 mg by mouth 2 (two) times daily as needed for anxiety.     OLANZapine zydis (ZYPREXA) 10 MG disintegrating tablet Take 1 tablet (10 mg total) by mouth at bedtime for 10 days. 10 tablet 0   temazepam (RESTORIL) 22.5 MG capsule Take 22.5 mg by mouth at bedtime.  0     Discharge Medications: Please see discharge summary for a list of discharge medications.  Relevant Imaging Results:  Relevant Lab Results:   Additional Information SSN [id number]  [place], LCSW

## 2020-11-09 NOTE — NC FL2 (Signed)
[place] LEVEL OF CARE SCREENING TOOL     IDENTIFICATION  Patient Name: [name] Birthdate: Sep 03, 1950 Sex: female Admission Date (Current Location): 11/04/2020  [facility] and [place] Number:  Herbalist and Address:  [facility],  [place], [place]      Provider Number: [id number]  Attending Physician Name and Address:  Default, Provider, MD  Relative Name and Phone Number:       Current Level of Care: Hospital Recommended Level of Care: [place] Prior Approval Number:    Date Approved/Denied:   PASRR Number:    Discharge Plan: Other (Comment) (Assist Living Facility)    Current Diagnoses: Patient Active Problem List   Diagnosis Date Noted   Protein-calorie malnutrition, severe 08/03/2020   COPD with exacerbation ([place]) 08/02/2020   Acute respiratory failure with hypoxia ([place]) 08/01/2020   Severe recurrent major depression with psychotic features ([place]) 07/03/2020   Hallucinations    Shortness of breath 04/20/2020   Chronic diastolic CHF (congestive heart failure) ([place]) 04/20/2020   Nausea & vomiting 04/20/2020   Prolonged QT interval 04/20/2020   COPD with acute exacerbation ([place]) 06/20/2017   DOE (dyspnea on exertion) 06/19/2017   Cervical dystonia 11/27/2016   C. difficile colitis 09/10/2016   Abdominal pain 09/08/2016   Acute encephalopathy    Fever    Left hemiparesis (HCC)    Generalized OA    Fibromyalgia    Tobacco abuse    Substance abuse (HCC)    Benign essential HTN    Tachycardia    Chronic pain syndrome    Acute blood loss anemia    Leukocytosis    Septic thrombophlebitis of sagittal sinus    Acute respiratory failure (HCC)    Streptococcus pneumoniae meningitis    Streptococcal bacteremia    Meningitis    History of stroke 04/12/2016   Stroke (cerebrum) ([place]) 04/12/2016   Neck pain 04/04/2016   Chronic abdominal pain 02/19/2016   Essential hypertension 02/19/2016    Bacterial conjunctivitis 02/19/2016   Health care maintenance 02/19/2016   Anxiety 05/08/2006   Cigarette smoker 05/08/2006   ASTHMA, UNSPECIFIED 05/08/2006   GERD (gastroesophageal reflux disease) 05/08/2006   CONSTIPATION 05/08/2006   CONVULSIONS, SEIZURES, NOS 05/08/2006    Orientation RESPIRATION BLADDER Height & Weight     Self, Place, Situation  Normal Continent Weight: 44.9 kg Height:  '5\' 4"'$  (162.6 cm)  BEHAVIORAL SYMPTOMS/MOOD NEUROLOGICAL BOWEL NUTRITION STATUS  Wanderer   Continent Diet (Regular)  AMBULATORY STATUS COMMUNICATION OF NEEDS Skin   Independent Verbally Normal                       Personal Care Assistance Level of Assistance  Bathing, Feeding, Dressing Bathing Assistance: Independent Feeding assistance: Independent Dressing Assistance: Independent     Functional Limitations Info  Sight, Hearing, Speech Sight Info: Adequate Hearing Info: Adequate Speech Info: Adequate    SPECIAL CARE FACTORS FREQUENCY                       Contractures Contractures Info: Not present    Additional Factors Info  Code Status, Allergies Code Status Info: full Allergies Info: Tylenol (acetaminophen) , Codeine, Nsaids, Tolmetin, Tramadol           Current Medications (11/09/2020):      Discharge Medications: Medication Sig Dispense Refill   acetaminophen (TYLENOL) 500 MG tablet Take 1,000 mg by mouth every 6 (six)  hours as needed for mild pain.       buprenorphine (SUBUTEX) 2 MG SUBL SL tablet Place 2 mg under the tongue 3 (three) times daily.       carvedilol (COREG) 6.25 MG tablet Take 6.25 mg by mouth 2 (two) times daily with a meal.   6   doxepin (SINEQUAN) 50 MG capsule Take 50 mg by mouth daily.       LORazepam (ATIVAN) 0.5 MG tablet Take 0.5 mg by mouth 2 (two) times daily as needed for anxiety.       OLANZapine zydis (ZYPREXA) 10 MG disintegrating tablet Take 1 tablet (10 mg total) by mouth at bedtime for 10 days. 10 tablet 0   temazepam  (RESTORIL) 22.5 MG capsule Take 22.5 mg by mouth at bedtime.   0     Relevant Imaging Results:  Relevant Lab Results:   Additional Information SSN [id number]  Joaquin Courts, RN

## 2020-11-10 MED ORDER — LIDOCAINE 5 % EX PTCH
1.0000 | MEDICATED_PATCH | CUTANEOUS | Status: DC
  Filled 2020-11-10 (×3): qty 1

## 2020-11-10 NOTE — Progress Notes (Signed)
Noted Quantiferon test is pending, repeat covid has resulted negative.  [facility] when quantiferon test results.

## 2020-11-10 NOTE — ED Provider Notes (Signed)
Emergency Medicine Observation Re-evaluation Note  [name] is a 70 y.o. female, seen on rounds today.  Pt initially presented to the ED for complaints of Abdominal Pain Currently, the patient is resting comfortably, waiting on TOC placement..  Physical Exam  BP (!) 149/83 (BP Location: Left Arm)   Pulse 73   Temp 97.9 F (36.6 C) (Oral)   Resp 16   Ht '5\' 4"'$  (1.626 m)   Wt 44.9 kg   SpO2 100%   BMI 16.99 kg/m  Physical Exam General: Resting comfortably  Psych: Goal oriented speech  ED Course / MDM  EKG:EKG Interpretation  Date/Time:  Saturday November 04 2020 14:36:32 EDT Ventricular Rate:  78 PR Interval:  109 QRS Duration: 99 QT Interval:  405 QTC Calculation: 462 R Axis:   32 Text Interpretation: Sinus rhythm Short PR interval Similar to prior ECG Confirmed by Wynona Dove ([id number]) on 11/04/2020 4:14:45 PM  I have reviewed the labs performed to date as well as medications administered while in observation.  Recent changes in the last 24 hours include nothing .  Plan  Current plan is for the same, waiting on placement.[name] is not under involuntary commitment.     Sherrill Raring, PA-C 11/10/20 0754    Lennice Sites, DO 11/10/20 1011

## 2020-11-11 NOTE — ED Notes (Signed)
Patient crawling around on the floor, saying "I am doing this for God."

## 2020-11-11 NOTE — Progress Notes (Signed)
CSW will monitor chart for TB test results.  Will contact assisted living once results are in.

## 2020-11-11 NOTE — ED Notes (Signed)
Patient given a drink.

## 2020-11-11 NOTE — ED Notes (Addendum)
From [place] with social work:  The group home will come pick her up when she is ready after the TB test is clear. The owner's name is [employer] Miners his number [phone].

## 2020-11-11 NOTE — ED Notes (Signed)
Called lab to ask about TB gold test because it has been past 48 hours since collected. Lab said it could be 2-5 days before it results.

## 2020-11-12 MED ORDER — ACETAMINOPHEN 325 MG PO TABS
650.0000 mg | ORAL_TABLET | Freq: Once | ORAL | Status: AC
  Administered 2020-11-12: 650 mg via ORAL
  Filled 2020-11-12: qty 2

## 2020-11-12 NOTE — ED Notes (Signed)
Pt has one pt belonging bag located in the pt belonging cabinet at the [place].

## 2020-11-12 NOTE — ED Notes (Signed)
Patient hide her medication in the tissue box after she pretend that she took them. Sitter made me aware of the patient hide the medication that she pretend to have taken them.

## 2020-11-13 NOTE — Progress Notes (Signed)
Patient said she needs to use the bathroom. Writer remind patient she not allow to close the door all the way she has to leave a crack in the doorway for her safety. Patient complied  leaving a crack in the doorway.

## 2020-11-13 NOTE — Progress Notes (Signed)
Patient goes to doorway bathroom stands there. Writer asked do you have to use the bathroom? Patient shakes her head no. Writer explain she has to be in her room or the bathroom she can not stand in the hallway.

## 2020-11-13 NOTE — Progress Notes (Signed)
Patient asked for graham crackers to eat.  Writer asked patient she just received her dinner tray. Patient said that was not enough food for her to eat.Writer gave graham crackers to patient.

## 2020-11-14 LAB — QUANTIFERON-TB GOLD PLUS (RQFGPL)
QuantiFERON Mitogen Value: >10 IU/mL
QuantiFERON Nil Value: 0.08 IU/mL
QuantiFERON TB1 Ag Value: 0.08 IU/mL
QuantiFERON TB2 Ag Value: 0.07 IU/mL

## 2020-11-14 LAB — QUANTIFERON-TB GOLD PLUS: QuantiFERON-TB Gold Plus: NEGATIVE

## 2020-11-14 MED ORDER — TUBERCULIN PPD 5 UNIT/0.1ML ID SOLN
5.0000 [IU] | INTRADERMAL | Status: DC
Start: 2020-11-14 — End: 2020-11-15
  Administered 2020-11-14: 5 [IU] via INTRADERMAL
  Filled 2020-11-14: qty 0.1

## 2020-11-14 NOTE — ED Notes (Signed)
Patient is laying in bed, but not asleep.

## 2020-11-14 NOTE — ED Provider Notes (Signed)
Emergency Medicine Observation Re-evaluation Note  [name] is a 70 y.o. female, seen on rounds today.  Pt initially presented to the ED for complaints of Abdominal Pain Currently, the patient is awaiting TOC placement.  Physical Exam  BP 133/72 (BP Location: Left Arm)   Pulse 71   Temp 97.9 F (36.6 C) (Oral)   Resp 18   Ht '5\' 4"'$  (1.626 m)   Wt 44.9 kg   SpO2 99%   BMI 16.99 kg/m  Physical Exam General: No distress Cardiac: Normal rate Lungs: Breathing unlabored Psych: No agitation  ED Course / MDM  EKG:EKG Interpretation  Date/Time:  Saturday November 04 2020 14:36:32 EDT Ventricular Rate:  78 PR Interval:  109 QRS Duration: 99 QT Interval:  405 QTC Calculation: 462 R Axis:   32 Text Interpretation: Sinus rhythm Short PR interval Similar to prior ECG Confirmed by Wynona Dove ([id number]) on 11/04/2020 4:14:45 PM  I have reviewed the labs performed to date as well as medications administered while in observation.  Recent changes in the last 24 hours include continues to await placement in group home.  Plan  Current plan is for social work disposition.  [name] is not under involuntary commitment.     Godfrey Pick, MD 11/14/20 [phone]

## 2020-11-14 NOTE — ED Notes (Signed)
TB given in right lower forearm

## 2020-11-14 NOTE — Progress Notes (Signed)
CSW received call from Pt's son [name], he stated "I wanted to check on my mom " and asked for group home information. CSW provided group home contact information to pt's son. Pt's TB test is still pending, will update group home.     Arlie Solomons.Brecklyn Galvis, MSW, [place]  Transitions of Care Clinical Social Worker I Direct Dial: [phone]  Fax: [phone] [email or web address]

## 2020-11-15 MED ORDER — OLANZAPINE 10 MG PO TABS: 10.0000 mg | ORAL_TABLET | Freq: Every day | ORAL | 0 refills | Status: DC

## 2020-11-15 MED ORDER — CARVEDILOL 6.25 MG PO TABS: 6.2500 mg | ORAL_TABLET | Freq: Two times a day (BID) | ORAL | 0 refills | Status: DC

## 2020-11-15 MED ORDER — TEMAZEPAM 22.5 MG PO CAPS: 22.5000 mg | ORAL_CAPSULE | Freq: Every day | ORAL | 0 refills | Status: DC

## 2020-11-15 MED ORDER — ACETAMINOPHEN 500 MG PO TABS: 1000.0000 mg | ORAL_TABLET | Freq: Four times a day (QID) | ORAL | 0 refills | Status: DC | PRN

## 2020-11-15 MED ORDER — DOXEPIN HCL 50 MG PO CAPS: 50.0000 mg | ORAL_CAPSULE | Freq: Every day | ORAL | 0 refills | Status: DC

## 2020-11-15 MED ORDER — LORAZEPAM 0.5 MG PO TABS: 0.5000 mg | ORAL_TABLET | Freq: Two times a day (BID) | ORAL | 0 refills | Status: DC | PRN

## 2020-11-15 NOTE — ED Notes (Signed)
[name], Social Work, notified of pt's [name] has resulted.

## 2020-11-15 NOTE — ED Notes (Signed)
Safe transport here to take pt.

## 2020-11-15 NOTE — ED Notes (Signed)
Pt sitting upright at bedside. Sitter at the bedside. 1:1.

## 2020-11-15 NOTE — Progress Notes (Addendum)
CSW spoke with [name] from [place], CSW informed him of TB test , he requested resulted be emailed.CSW will contact APS worker [name] for consent. Pt is ready for d/c RN and MD was notified . CSW to arrange transportation for pt.    Adden 9:15am  Safe transport was called pt will be picked up between 10:15 -11.  10:40am MD aware of pt pharmacy, RX care in [place]. CSW faxed TB result to [facility] from [facility] [employer] of living.CSW informed pt's son and APS worker of pt discharge.   [name].[name], MSW, [place]  Transitions of Care Clinical Social Worker I Direct Dial: [phone]  Fax: [phone] [name]

## 2020-11-15 NOTE — ED Provider Notes (Signed)
Social work reports that the patient is ready for transfer to her group home.  I have discharged the patient.   Daleen Bo, MD 11/15/20 [phone]

## 2020-11-15 NOTE — ED Notes (Signed)
[name], Social Work, to arrange pt transport to group home. ED provider aware.

## 2020-11-15 NOTE — ED Notes (Signed)
Pt sitting up eating breakfast.

## 2020-11-15 NOTE — ED Notes (Addendum)
Per Lu Duffel, Social Work, Ship broker to pick-up pt for transportation to group home, is 1015AM - 1100AM this morning.

## 2020-12-03 ENCOUNTER — Emergency Department (HOSPITAL_COMMUNITY)
Admission: EM | Admit: 2020-12-03 | Discharge: 2020-12-03 | Disposition: A | Discharge status: Skilled Nursing Facility | Payer: Medicare Other | Attending: Emergency Medicine | Admitting: Emergency Medicine

## 2020-12-03 ENCOUNTER — Encounter (HOSPITAL_COMMUNITY): Payer: Self-pay | Admitting: Emergency Medicine

## 2020-12-03 DIAGNOSIS — I5032 Chronic diastolic (congestive) heart failure: Secondary | ICD-10-CM | POA: Insufficient documentation

## 2020-12-03 DIAGNOSIS — Z79899 Other long term (current) drug therapy: Secondary | ICD-10-CM | POA: Insufficient documentation

## 2020-12-03 DIAGNOSIS — J45909 Unspecified asthma, uncomplicated: Secondary | ICD-10-CM | POA: Diagnosis not present

## 2020-12-03 DIAGNOSIS — R1110 Vomiting, unspecified: Secondary | ICD-10-CM

## 2020-12-03 DIAGNOSIS — Z859 Personal history of malignant neoplasm, unspecified: Secondary | ICD-10-CM | POA: Insufficient documentation

## 2020-12-03 DIAGNOSIS — J441 Chronic obstructive pulmonary disease with (acute) exacerbation: Secondary | ICD-10-CM | POA: Diagnosis not present

## 2020-12-03 DIAGNOSIS — F17210 Nicotine dependence, cigarettes, uncomplicated: Secondary | ICD-10-CM | POA: Insufficient documentation

## 2020-12-03 DIAGNOSIS — R112 Nausea with vomiting, unspecified: Secondary | ICD-10-CM | POA: Diagnosis present

## 2020-12-03 DIAGNOSIS — Z20822 Contact with and (suspected) exposure to covid-19: Secondary | ICD-10-CM | POA: Diagnosis not present

## 2020-12-03 DIAGNOSIS — I110 Hypertensive heart disease with heart failure: Secondary | ICD-10-CM | POA: Diagnosis not present

## 2020-12-03 LAB — URINALYSIS, ROUTINE W REFLEX MICROSCOPIC
Bilirubin Urine: NEGATIVE
Glucose, UA: 50 mg/dL — AB
Hgb urine dipstick: NEGATIVE
Ketones, ur: NEGATIVE mg/dL
Leukocytes,Ua: NEGATIVE
Nitrite: NEGATIVE
Protein, ur: NEGATIVE mg/dL
Specific Gravity, Urine: 1.012 (ref 1.005–1.030)
pH: 8 (ref 5.0–8.0)

## 2020-12-03 LAB — RESP PANEL BY RT-PCR (FLU A&B, COVID) ARPGX2
Influenza A by PCR: NEGATIVE
Influenza B by PCR: NEGATIVE
SARS Coronavirus 2 by RT PCR: NEGATIVE

## 2020-12-03 LAB — COMPREHENSIVE METABOLIC PANEL
ALT: 149 U/L — ABNORMAL HIGH (ref 0–44)
AST: 101 U/L — ABNORMAL HIGH (ref 15–41)
Albumin: 3.9 g/dL (ref 3.5–5.0)
Alkaline Phosphatase: 242 U/L — ABNORMAL HIGH (ref 38–126)
Anion gap: 8 (ref 5–15)
BUN: 14 mg/dL (ref 8–23)
CO2: 30 mmol/L (ref 22–32)
Calcium: 9.4 mg/dL (ref 8.9–10.3)
Chloride: 103 mmol/L (ref 98–111)
Creatinine, Ser: 0.57 mg/dL (ref 0.44–1.00)
GFR, Estimated: >60 mL/min (ref >60)
Glucose, Bld: 136 mg/dL — ABNORMAL HIGH (ref 70–99)
Potassium: 3.7 mmol/L (ref 3.5–5.1)
Sodium: 141 mmol/L (ref 135–145)
Total Bilirubin: 0.5 mg/dL (ref 0.3–1.2)
Total Protein: 7.3 g/dL (ref 6.5–8.1)

## 2020-12-03 LAB — CBC
HCT: 47.5 % — ABNORMAL HIGH (ref 36.0–46.0)
Hemoglobin: 15.7 g/dL — ABNORMAL HIGH (ref 12.0–15.0)
MCH: 28.9 pg (ref 26.0–34.0)
MCHC: 33.1 g/dL (ref 30.0–36.0)
MCV: 87.5 fL (ref 80.0–100.0)
Platelets: 631 10*3/uL — ABNORMAL HIGH (ref 150–400)
RBC: 5.43 MIL/uL — ABNORMAL HIGH (ref 3.87–5.11)
RDW: 13.2 % (ref 11.5–15.5)
WBC: 9.2 10*3/uL (ref 4.0–10.5)
nRBC: 0 % (ref 0.0–0.2)

## 2020-12-03 LAB — LIPASE, BLOOD: Lipase: 29 U/L (ref 11–51)

## 2020-12-03 MED ORDER — LORAZEPAM 2 MG/ML IJ SOLN
0.5000 mg | Freq: Once | INTRAMUSCULAR | Status: AC
  Administered 2020-12-03: 0.5 mg via INTRAVENOUS
  Filled 2020-12-03: qty 1

## 2020-12-03 MED ORDER — SODIUM CHLORIDE 0.9 % IV BOLUS
1000.0000 mL | Freq: Once | INTRAVENOUS | Status: AC
  Administered 2020-12-03: 1000 mL via INTRAVENOUS

## 2020-12-03 NOTE — ED Provider Notes (Signed)
  Face-to-face evaluation   History: She presents for evaluation of vomiting which started yesterday.  No bowel movements for about 2 days.  She denies fever, chills, nausea or vomiting.  She states she took her blood pressure medicines this morning but not her other medications.  She lives in an assisted living facility.  No known sick contacts.  Physical exam: Elderly appearing, alert and cooperative.  Abdomen soft nontender.  Neck is supple.  No dysarthria or aphasia.  Medical screening examination/treatment/procedure(s) were conducted as a shared visit with non-physician practitioner(s) and myself.  I personally evaluated the patient during the encounter    Daleen Bo, MD 12/04/20 1101

## 2020-12-03 NOTE — ED Notes (Signed)
Pt tolerating PO fluids and crackers

## 2020-12-03 NOTE — ED Notes (Signed)
Removed pt's IV, Cath was intact

## 2020-12-03 NOTE — Discharge Instructions (Addendum)
Return if any problems.  You need to have repeat liver function test in 1 week

## 2020-12-03 NOTE — ED Triage Notes (Signed)
Pt brought in by [facility] for emesis and chills since yesterday. Pt states she has vomited about 5 times since yesterday.

## 2020-12-03 NOTE — ED Provider Notes (Signed)
[facility] Provider Note   CSN: [id number] Arrival date & time: 12/03/20  1336     History Chief Complaint  Patient presents with   Emesis    [name] is a 70 y.o. female.  Pt reports she has had vomiting 5 times today.  Pt reports this feels like when she had pancreatitis in the past.  Pt denies fever or chills.  No known infection exposure.  No questionable foods.    The history is provided by the patient. No language interpreter was used.  Emesis Severity:  Moderate Duration:  1 day Timing:  Constant Number of daily episodes:  5 Relieved by:  Nothing Worsened by:  Nothing Ineffective treatments:  None tried Associated symptoms: abdominal pain   Risk factors: no alcohol use and no sick contacts       Past Medical History:  Diagnosis Date   Acute blood loss anemia    Arrhythmia    Arthritis    C. difficile colitis    Cancer ([place])    Diverticulosis    Fibromyalgia    GERD (gastroesophageal reflux disease)    GI hemorrhage    H/O: substance abuse ([place]) 2005   narcotic usage due to chronic back pain   Heart murmur    Hypertension    Pancreatic cyst    Pneumococcal meningitis    PONV (postoperative nausea and vomiting)    Renal cyst    Stroke (cerebrum) [facility])     Patient Active Problem List   Diagnosis Date Noted   Protein-calorie malnutrition, severe 08/03/2020   COPD with exacerbation ([place]) 08/02/2020   Acute respiratory failure with hypoxia ([place]) 08/01/2020   Severe recurrent major depression with psychotic features ([place]) 07/03/2020   Hallucinations    Shortness of breath 04/20/2020   Chronic diastolic CHF (congestive heart failure) ([place]) 04/20/2020   Nausea & vomiting 04/20/2020   Prolonged QT interval 04/20/2020   COPD with acute exacerbation ([place]) 06/20/2017   DOE (dyspnea on exertion) 06/19/2017   Cervical dystonia 11/27/2016   C. difficile colitis 09/10/2016   Abdominal pain 09/08/2016   Acute encephalopathy     Fever    Left hemiparesis ([facility])    Generalized OA    Fibromyalgia    Tobacco abuse    Substance abuse ([place])    Benign essential HTN    Tachycardia    Chronic pain syndrome    Acute blood loss anemia    Leukocytosis    Septic thrombophlebitis of sagittal sinus    Acute respiratory failure ([facility])    Streptococcus pneumoniae meningitis    Streptococcal bacteremia    Meningitis    History of stroke 04/12/2016   Stroke (cerebrum) ([place]) 04/12/2016   Neck pain 04/04/2016   Chronic abdominal pain 02/19/2016   Essential hypertension 02/19/2016   Bacterial conjunctivitis 02/19/2016   Health care maintenance 02/19/2016   Anxiety 05/08/2006   Cigarette smoker 05/08/2006   ASTHMA, UNSPECIFIED 05/08/2006   GERD (gastroesophageal reflux disease) 05/08/2006   CONSTIPATION 05/08/2006   CONVULSIONS, SEIZURES, NOS 05/08/2006    Past Surgical History:  Procedure Laterality Date   ABDOMINAL HYSTERECTOMY     ABDOMINAL SURGERY     APPENDECTOMY     BIOPSY  04/22/2020   Procedure: BIOPSY;  Surgeon: Milus Banister, MD;  Location: [facility];  Service: Gastroenterology;;   BREAST SURGERY     CHOLECYSTECTOMY N/A 09/30/2012   Procedure: LAPAROSCOPIC CHOLECYSTECTOMY WITH INTRAOPERATIVE CHOLANGIOGRAM;  Surgeon: Imogene Burn. Georgette Dover, MD;  Location:  [facility];  Service: General;  Laterality: N/A;   ESOPHAGOGASTRODUODENOSCOPY (EGD) WITH PROPOFOL N/A 04/22/2020   Procedure: ESOPHAGOGASTRODUODENOSCOPY (EGD) WITH PROPOFOL;  Surgeon: Milus Banister, MD;  Location: [facility];  Service: Gastroenterology;  Laterality: N/A;   FRACTURE SURGERY     right upper arm   OVARIAN CYST SURGERY       OB History   No obstetric history on file.     Family History  Problem Relation Age of Onset   Alzheimer's disease Mother    Heart disease Father    Prostate cancer Father     Social History   Tobacco Use   Smoking status: Every Day    Packs/day: 0.75    Years: 49.00    Pack years: 36.75    Types:  Cigarettes   Smokeless tobacco: Never  Vaping Use   Vaping Use: Never used  Substance Use Topics   Alcohol use: No   Drug use: No    Comment: Methadone for narcotic use    Home Medications Prior to Admission medications   Medication Sig Start Date End Date Taking? Authorizing Provider  acetaminophen (TYLENOL) 500 MG tablet Take 2 tablets (1,000 mg total) by mouth every 6 (six) hours as needed for mild pain. 11/15/20   Daleen Bo, MD  buprenorphine (SUBUTEX) 2 MG SUBL SL tablet Place 2 mg under the tongue 3 (three) times daily. 04/15/20   [provider]  carvedilol (COREG) 6.25 MG tablet Take 1 tablet (6.25 mg total) by mouth 2 (two) times daily with a meal. 11/15/20   Daleen Bo, MD  doxepin (SINEQUAN) 50 MG capsule Take 1 capsule (50 mg total) by mouth daily. 11/15/20   Daleen Bo, MD  LORazepam (ATIVAN) 0.5 MG tablet Take 1 tablet (0.5 mg total) by mouth 2 (two) times daily as needed for anxiety. 11/15/20   Daleen Bo, MD  OLANZapine (ZYPREXA) 10 MG tablet Take 1 tablet (10 mg total) by mouth at bedtime. 11/15/20   Daleen Bo, MD  temazepam (RESTORIL) 22.5 MG capsule Take 1 capsule (22.5 mg total) by mouth at bedtime. 11/15/20   Daleen Bo, MD    Allergies    Tylenol [acetaminophen], Codeine, Nsaids, Tolmetin, and Tramadol  Review of Systems   Review of Systems  Gastrointestinal:  Positive for abdominal pain and vomiting.  All other systems reviewed and are negative.  Physical Exam Updated Vital Signs BP (!) 189/77   Pulse 72   Temp 98.1 F (36.7 C) (Oral)   Resp 17   Ht 5\' 4"  (1.626 m)   Wt 44.9 kg   SpO2 100%   BMI 16.99 kg/m   Physical Exam Vitals and nursing note reviewed.  Constitutional:      Appearance: She is well-developed.  HENT:     Head: Normocephalic.  Cardiovascular:     Rate and Rhythm: Normal rate and regular rhythm.  Pulmonary:     Effort: Pulmonary effort is normal.  Abdominal:     General: There is no distension.   Musculoskeletal:        General: Normal range of motion.     Cervical back: Normal range of motion.  Skin:    General: Skin is warm.  Neurological:     Mental Status: She is alert and oriented to person, place, and time.    ED Results / Procedures / Treatments   Labs (all labs ordered are listed, but only abnormal results are displayed) Labs Reviewed  COMPREHENSIVE METABOLIC PANEL - Abnormal; Notable  for the following components:      Result Value   Glucose, Bld 136 (*)    AST 101 (*)    ALT 149 (*)    Alkaline Phosphatase 242 (*)    All other components within normal limits  CBC - Abnormal; Notable for the following components:   RBC 5.43 (*)    Hemoglobin 15.7 (*)    HCT 47.5 (*)    Platelets 631 (*)    All other components within normal limits  URINALYSIS, ROUTINE W REFLEX MICROSCOPIC - Abnormal; Notable for the following components:   APPearance CLOUDY (*)    Glucose, UA 50 (*)    All other components within normal limits  RESP PANEL BY RT-PCR (FLU A&B, COVID) ARPGX2  LIPASE, BLOOD    EKG None  Radiology No results found.  Procedures Procedures   Medications Ordered in ED Medications  sodium chloride 0.9 % bolus 1,000 mL (0 mLs Intravenous Stopped 12/03/20 1540)  LORazepam (ATIVAN) injection 0.5 mg (0.5 mg Intravenous Given 12/03/20 1454)    ED Course  I have reviewed the triage vital signs and the nursing notes.  Pertinent labs & imaging results that were available during my care of the patient were reviewed by me and considered in my medical decision making (see chart for details).    MDM Rules/Calculators/A&P                           MDM:  Pt given Iv fluids x 1 liter ,  Pt has a histroy of prolonged qt syndrome.   Pt given ativan iv for nausea with relief of nausea and vomiting.  Pt has elevated lft's  Pt advised to return if symptoms worsen or change.  Pt needs follow up liver function test  Final Clinical Impression(s) / ED Diagnoses Final  diagnoses:  Vomiting, intractability of vomiting not specified, presence of nausea not specified, unspecified vomiting type  An After Visit Summary was printed and given to the patient.   Rx / DC Orders ED Discharge Orders     None     An After Visit Summary was printed and given to the patient.    Fransico Meadow, [place] 12/03/20 1741    Milton Ferguson, MD 12/04/20 1037

## 2020-12-17 ENCOUNTER — Emergency Department (HOSPITAL_COMMUNITY)
Admission: EM | Admit: 2020-12-17 | Discharge: 2020-12-18 | Disposition: A | Discharge status: Home / Self Care | Payer: Medicare Other | Attending: Emergency Medicine | Admitting: Emergency Medicine

## 2020-12-17 ENCOUNTER — Other Ambulatory Visit: Payer: Self-pay

## 2020-12-17 ENCOUNTER — Emergency Department (HOSPITAL_COMMUNITY): Payer: Medicare Other

## 2020-12-17 ENCOUNTER — Encounter (HOSPITAL_COMMUNITY): Payer: Self-pay

## 2020-12-17 DIAGNOSIS — R112 Nausea with vomiting, unspecified: Secondary | ICD-10-CM | POA: Insufficient documentation

## 2020-12-17 DIAGNOSIS — I5032 Chronic diastolic (congestive) heart failure: Secondary | ICD-10-CM | POA: Diagnosis not present

## 2020-12-17 DIAGNOSIS — K219 Gastro-esophageal reflux disease without esophagitis: Secondary | ICD-10-CM | POA: Diagnosis not present

## 2020-12-17 DIAGNOSIS — J449 Chronic obstructive pulmonary disease, unspecified: Secondary | ICD-10-CM | POA: Insufficient documentation

## 2020-12-17 DIAGNOSIS — R52 Pain, unspecified: Secondary | ICD-10-CM | POA: Diagnosis not present

## 2020-12-17 DIAGNOSIS — R1013 Epigastric pain: Secondary | ICD-10-CM | POA: Insufficient documentation

## 2020-12-17 DIAGNOSIS — Z85828 Personal history of other malignant neoplasm of skin: Secondary | ICD-10-CM | POA: Diagnosis not present

## 2020-12-17 DIAGNOSIS — R109 Unspecified abdominal pain: Secondary | ICD-10-CM

## 2020-12-17 DIAGNOSIS — R6889 Other general symptoms and signs: Secondary | ICD-10-CM | POA: Diagnosis not present

## 2020-12-17 DIAGNOSIS — I4581 Long QT syndrome: Secondary | ICD-10-CM | POA: Insufficient documentation

## 2020-12-17 DIAGNOSIS — R1110 Vomiting, unspecified: Secondary | ICD-10-CM | POA: Diagnosis not present

## 2020-12-17 DIAGNOSIS — F17210 Nicotine dependence, cigarettes, uncomplicated: Secondary | ICD-10-CM | POA: Insufficient documentation

## 2020-12-17 DIAGNOSIS — R9431 Abnormal electrocardiogram [ECG] [EKG]: Secondary | ICD-10-CM

## 2020-12-17 DIAGNOSIS — I110 Hypertensive heart disease with heart failure: Secondary | ICD-10-CM | POA: Insufficient documentation

## 2020-12-17 DIAGNOSIS — J45909 Unspecified asthma, uncomplicated: Secondary | ICD-10-CM | POA: Insufficient documentation

## 2020-12-17 DIAGNOSIS — Z743 Need for continuous supervision: Secondary | ICD-10-CM | POA: Diagnosis not present

## 2020-12-17 LAB — CBC WITH DIFFERENTIAL/PLATELET
Abs Immature Granulocytes: 0.06 10*3/uL (ref 0.00–0.07)
Basophils Absolute: 0.1 10*3/uL (ref 0.0–0.1)
Basophils Relative: 1 %
Eosinophils Absolute: 0.2 10*3/uL (ref 0.0–0.5)
Eosinophils Relative: 1 %
HCT: 44.3 % (ref 36.0–46.0)
Hemoglobin: 14.7 g/dL (ref 12.0–15.0)
Immature Granulocytes: 0 %
Lymphocytes Relative: 25 %
Lymphs Abs: 3.3 10*3/uL (ref 0.7–4.0)
MCH: 29.2 pg (ref 26.0–34.0)
MCHC: 33.2 g/dL (ref 30.0–36.0)
MCV: 88.1 fL (ref 80.0–100.0)
Monocytes Absolute: 1.1 10*3/uL — ABNORMAL HIGH (ref 0.1–1.0)
Monocytes Relative: 8 %
Neutro Abs: 8.6 10*3/uL — ABNORMAL HIGH (ref 1.7–7.7)
Neutrophils Relative %: 65 %
Platelets: 305 10*3/uL (ref 150–400)
RBC: 5.03 MIL/uL (ref 3.87–5.11)
RDW: 14 % (ref 11.5–15.5)
WBC: 13.4 10*3/uL — ABNORMAL HIGH (ref 4.0–10.5)
nRBC: 0 % (ref 0.0–0.2)

## 2020-12-17 LAB — COMPREHENSIVE METABOLIC PANEL
ALT: 22 U/L (ref 0–44)
AST: 20 U/L (ref 15–41)
Albumin: 4 g/dL (ref 3.5–5.0)
Alkaline Phosphatase: 145 U/L — ABNORMAL HIGH (ref 38–126)
Anion gap: 8 (ref 5–15)
BUN: 21 mg/dL (ref 8–23)
CO2: 27 mmol/L (ref 22–32)
Calcium: 9.2 mg/dL (ref 8.9–10.3)
Chloride: 102 mmol/L (ref 98–111)
Creatinine, Ser: 0.57 mg/dL (ref 0.44–1.00)
GFR, Estimated: >60 mL/min (ref >60)
Glucose, Bld: 122 mg/dL — ABNORMAL HIGH (ref 70–99)
Potassium: 3.4 mmol/L — ABNORMAL LOW (ref 3.5–5.1)
Sodium: 137 mmol/L (ref 135–145)
Total Bilirubin: 0.5 mg/dL (ref 0.3–1.2)
Total Protein: 7 g/dL (ref 6.5–8.1)

## 2020-12-17 LAB — LIPASE, BLOOD: Lipase: 36 U/L (ref 11–51)

## 2020-12-17 MED ORDER — IOHEXOL 300 MG/ML  SOLN
100.0000 mL | Freq: Once | INTRAMUSCULAR | Status: AC | PRN
  Administered 2020-12-17: 50 mL via INTRAVENOUS

## 2020-12-17 MED ORDER — MORPHINE SULFATE (PF) 4 MG/ML IV SOLN
4.0000 mg | Freq: Once | INTRAVENOUS | Status: AC
  Administered 2020-12-17: 4 mg via INTRAVENOUS
  Filled 2020-12-17: qty 1

## 2020-12-17 MED ORDER — ONDANSETRON 4 MG PO TBDP
4.0000 mg | ORAL_TABLET | Freq: Once | ORAL | Status: AC
  Administered 2020-12-17: 4 mg via ORAL
  Filled 2020-12-17: qty 1

## 2020-12-17 MED ORDER — POTASSIUM CHLORIDE 10 MEQ/100ML IV SOLN
10.0000 meq | Freq: Once | INTRAVENOUS | Status: AC
  Administered 2020-12-17: 10 meq via INTRAVENOUS
  Filled 2020-12-17: qty 100

## 2020-12-17 MED ORDER — SODIUM CHLORIDE 0.9 % IV BOLUS
1000.0000 mL | Freq: Once | INTRAVENOUS | Status: AC
  Administered 2020-12-17: 1000 mL via INTRAVENOUS

## 2020-12-17 NOTE — ED Triage Notes (Signed)
Pt arrived via [facility] with c/o n/v x 2 weeks et abd pain 8/10 on right side.

## 2020-12-17 NOTE — ED Provider Notes (Signed)
[facility] EMERGENCY DEPARTMENT Provider Note   CSN: [id number] Arrival date & time: 12/17/20  2051     History Chief Complaint  Patient presents with   Emesis    [name] is a 70 y.o. female.  Patient presents ER chief complaint of vomiting epigastric pain.  She states that similar episodes multiple times in the past and is concerned that her pancreas may be inflamed again.  Otherwise denies any fevers denies cough.  Denies diarrhea.  Denies any recent alcohol use.      Past Medical History:  Diagnosis Date   Acute blood loss anemia    Arrhythmia    Arthritis    C. difficile colitis    Cancer ([place])    Diverticulosis    Fibromyalgia    GERD (gastroesophageal reflux disease)    GI hemorrhage    H/O: substance abuse ([place]) 2005   narcotic usage due to chronic back pain   Heart murmur    Hypertension    Pancreatic cyst    Pneumococcal meningitis    PONV (postoperative nausea and vomiting)    Renal cyst    Stroke (cerebrum) [facility])     Patient Active Problem List   Diagnosis Date Noted   Protein-calorie malnutrition, severe 08/03/2020   COPD with exacerbation ([place]) 08/02/2020   Acute respiratory failure with hypoxia ([place]) 08/01/2020   Severe recurrent major depression with psychotic features ([place]) 07/03/2020   Hallucinations    Shortness of breath 04/20/2020   Chronic diastolic CHF (congestive heart failure) ([place]) 04/20/2020   Nausea & vomiting 04/20/2020   Prolonged QT interval 04/20/2020   COPD with acute exacerbation ([place]) 06/20/2017   DOE (dyspnea on exertion) 06/19/2017   Cervical dystonia 11/27/2016   C. difficile colitis 09/10/2016   Abdominal pain 09/08/2016   Acute encephalopathy    Fever    Left hemiparesis ([facility])    Generalized OA    Fibromyalgia    Tobacco abuse    Substance abuse ([facility])    Benign essential HTN    Tachycardia    Chronic pain syndrome    Acute blood loss anemia    Leukocytosis    Septic thrombophlebitis of sagittal  sinus    Acute respiratory failure ([facility])    Streptococcus pneumoniae meningitis    Streptococcal bacteremia    Meningitis    History of stroke 04/12/2016   Stroke (cerebrum) ([place]) 04/12/2016   Neck pain 04/04/2016   Chronic abdominal pain 02/19/2016   Essential hypertension 02/19/2016   Bacterial conjunctivitis 02/19/2016   Health care maintenance 02/19/2016   Anxiety 05/08/2006   Cigarette smoker 05/08/2006   ASTHMA, UNSPECIFIED 05/08/2006   GERD (gastroesophageal reflux disease) 05/08/2006   CONSTIPATION 05/08/2006   CONVULSIONS, SEIZURES, NOS 05/08/2006    Past Surgical History:  Procedure Laterality Date   ABDOMINAL HYSTERECTOMY     ABDOMINAL SURGERY     APPENDECTOMY     BIOPSY  04/22/2020   Procedure: BIOPSY;  Surgeon: Milus Banister, MD;  Location: [facility];  Service: Gastroenterology;;   BREAST SURGERY     CHOLECYSTECTOMY N/A 09/30/2012   Procedure: LAPAROSCOPIC CHOLECYSTECTOMY WITH INTRAOPERATIVE CHOLANGIOGRAM;  Surgeon: Imogene Burn. Georgette Dover, MD;  Location: [facility];  Service: General;  Laterality: N/A;   ESOPHAGOGASTRODUODENOSCOPY (EGD) WITH PROPOFOL N/A 04/22/2020   Procedure: ESOPHAGOGASTRODUODENOSCOPY (EGD) WITH PROPOFOL;  Surgeon: Milus Banister, MD;  Location: [facility];  Service: Gastroenterology;  Laterality: N/A;   FRACTURE SURGERY     right upper arm   OVARIAN  CYST SURGERY       OB History   No obstetric history on file.     Family History  Problem Relation Age of Onset   Alzheimer's disease Mother    Heart disease Father    Prostate cancer Father     Social History   Tobacco Use   Smoking status: Every Day    Packs/day: 0.75    Years: 49.00    Pack years: 36.75    Types: Cigarettes   Smokeless tobacco: Never  Vaping Use   Vaping Use: Never used  Substance Use Topics   Alcohol use: No   Drug use: No    Comment: Methadone for narcotic use    Home Medications Prior to Admission medications   Medication Sig Start Date End Date  Taking? Authorizing Provider  acetaminophen (TYLENOL) 500 MG tablet Take 2 tablets (1,000 mg total) by mouth every 6 (six) hours as needed for mild pain. 11/15/20   Daleen Bo, MD  buprenorphine (SUBUTEX) 2 MG SUBL SL tablet Place 2 mg under the tongue 3 (three) times daily. 04/15/20   [provider]  carvedilol (COREG) 6.25 MG tablet Take 1 tablet (6.25 mg total) by mouth 2 (two) times daily with a meal. 11/15/20   Daleen Bo, MD  doxepin (SINEQUAN) 50 MG capsule Take 1 capsule (50 mg total) by mouth daily. 11/15/20   Daleen Bo, MD  LORazepam (ATIVAN) 0.5 MG tablet Take 1 tablet (0.5 mg total) by mouth 2 (two) times daily as needed for anxiety. 11/15/20   Daleen Bo, MD  OLANZapine (ZYPREXA) 10 MG tablet Take 1 tablet (10 mg total) by mouth at bedtime. 11/15/20   Daleen Bo, MD  temazepam (RESTORIL) 22.5 MG capsule Take 1 capsule (22.5 mg total) by mouth at bedtime. 11/15/20   Daleen Bo, MD    Allergies    Tylenol [acetaminophen], Codeine, Nsaids, Tolmetin, and Tramadol  Review of Systems   Review of Systems  Constitutional:  Negative for fever.  HENT:  Negative for ear pain.   Eyes:  Negative for pain.  Respiratory:  Negative for cough.   Cardiovascular:  Negative for chest pain.  Gastrointestinal:  Positive for abdominal pain.  Genitourinary:  Negative for flank pain.  Musculoskeletal:  Negative for back pain.  Skin:  Negative for rash.  Neurological:  Negative for headaches.   Physical Exam Updated Vital Signs BP (!) 176/86 (BP Location: Left Arm)   Pulse 83   Temp 98.3 F (36.8 C) (Oral)   Ht 5\' 4"  (1.626 m)   Wt 43.1 kg   SpO2 95%   BMI 16.31 kg/m   Physical Exam Constitutional:      General: She is not in acute distress.    Appearance: Normal appearance.  HENT:     Head: Normocephalic.     Nose: Nose normal.  Eyes:     Extraocular Movements: Extraocular movements intact.  Cardiovascular:     Rate and Rhythm: Normal rate.  Pulmonary:      Effort: Pulmonary effort is normal.  Abdominal:     Tenderness: There is no abdominal tenderness. There is no guarding or rebound.  Musculoskeletal:        General: Normal range of motion.     Cervical back: Normal range of motion.  Neurological:     General: No focal deficit present.     Mental Status: She is alert. Mental status is at baseline.    ED Results / Procedures / Treatments  Labs (all labs ordered are listed, but only abnormal results are displayed) Labs Reviewed  CBC WITH DIFFERENTIAL/PLATELET - Abnormal; Notable for the following components:      Result Value   WBC 13.4 (*)    Neutro Abs 8.6 (*)    Monocytes Absolute 1.1 (*)    All other components within normal limits  COMPREHENSIVE METABOLIC PANEL - Abnormal; Notable for the following components:   Potassium 3.4 (*)    Glucose, Bld 122 (*)    Alkaline Phosphatase 145 (*)    All other components within normal limits  LIPASE, BLOOD  URINALYSIS, ROUTINE W REFLEX MICROSCOPIC    EKG EKG Interpretation  Date/Time:  Sunday December 17 2020 21:42:04 EDT Ventricular Rate:  82 PR Interval:  146 QRS Duration: 99 QT Interval:  411 QTC Calculation: 480 R Axis:   -34 Text Interpretation: Sinus rhythm Left axis deviation Confirmed by Thamas Jaegers ([id number]) on 12/17/2020 10:18:57 PM  Radiology CT Abdomen Pelvis W Contrast  Result Date: 12/17/2020 CLINICAL DATA:  Nausea and vomiting for 2 weeks, right-sided abdominal pain EXAM: CT ABDOMEN AND PELVIS WITH CONTRAST TECHNIQUE: Multidetector CT imaging of the abdomen and pelvis was performed using the standard protocol following bolus administration of intravenous contrast. CONTRAST:  38mL OMNIPAQUE IOHEXOL 300 MG/ML  SOLN COMPARISON:  10/06/2020 FINDINGS: Lower chest: No acute pleural or parenchymal lung disease. Hepatobiliary: There is stable intrahepatic and extrahepatic biliary duct dilation in this patient status post cholecystectomy. Liver is otherwise unremarkable.  Pancreas: Stable subcentimeter pancreatic cysts. No acute inflammatory changes. Spleen: Normal in size without focal abnormality. Adrenals/Urinary Tract: Bilateral renal cortical cysts are unchanged. Otherwise the kidneys enhance normally. No urinary tract calculi or obstructive uropathy. The bladder is decompressed, with no gross abnormalities. The adrenals are normal. Stomach/Bowel: No bowel obstruction or ileus. Moderate retained stool throughout the colon consistent with constipation. No bowel wall thickening or inflammatory change. Vascular/Lymphatic: Aortic atherosclerosis. No enlarged abdominal or pelvic lymph nodes. Reproductive: Status post hysterectomy. No adnexal masses. Other: No free fluid or free gas.  No abdominal wall hernia. Musculoskeletal: No acute or destructive bony lesions. Reconstructed images demonstrate no additional findings. IMPRESSION: 1. Cholecystectomy, with stable intra and extrahepatic biliary duct dilation. 2. Stable subcentimeter pancreatic cysts. Recommend follow up pre and post contrast MRI/MRCP or pancreatic protocol CT in 2 years. This recommendation follows ACR consensus guidelines: Management of Incidental Pancreatic Cysts: A White Paper of the ACR Incidental Findings Committee. J Am Coll Radiol 4098;11:914-782. 3. Moderate retained stool throughout the colon consistent with constipation. No bowel obstruction or ileus. 4. Stable renal cysts. 5.  Aortic Atherosclerosis (ICD10-I70.0). Electronically Signed   By: Randa Ngo M.D.   On: 12/17/2020 22:36    Procedures Procedures   Medications Ordered in ED Medications  morphine 4 MG/ML injection 4 mg (has no administration in time range)  ondansetron (ZOFRAN-ODT) disintegrating tablet 4 mg (has no administration in time range)  potassium chloride 10 mEq in 100 mL IVPB (has no administration in time range)  sodium chloride 0.9 % bolus 1,000 mL (1,000 mLs Intravenous New Bag/Given 12/17/20 2240)  iohexol (OMNIPAQUE) 300  MG/ML solution 100 mL (50 mLs Intravenous Contrast Given 12/17/20 2213)    ED Course  I have reviewed the triage vital signs and the nursing notes.  Pertinent labs & imaging results that were available during my care of the patient were reviewed by me and considered in my medical decision making (see chart for details).    MDM Rules/Calculators/A&P  Patient complaining of epigastric pain however there is no tenderness on exam.  Labs are sent and are unremarkable white, elevated.  Lipase is normal.  Given elevated white count CT imaging pursued.  CT abdomen pelvis shows persistent pancreatic cysts unchanged from prior imaging.  Patient given fluid resuscitation, potassium was repleted.  She has known history of QT prolongation, given Zofran oral single dose.  Advised outpatient follow-up with her doctor within the next 2 to 3 days.  Advising immediate return if she is unable to keep down any fluids or has any additional concerns.  Final Clinical Impression(s) / ED Diagnoses Final diagnoses:  Nausea and vomiting, unspecified vomiting type  Abdominal pain, unspecified abdominal location  QT prolongation    Rx / DC Orders ED Discharge Orders     None        Luna Fuse, MD 12/17/20 [phone]

## 2020-12-17 NOTE — Discharge Instructions (Addendum)
Call your primary care doctor or specialist as discussed in the next 2-3 days.   Return immediately back to the ER if:  Your symptoms worsen within the next 12-24 hours. You develop new symptoms such as new fevers, persistent vomiting, new pain, shortness of breath, or new weakness or numbness, or if you have any other concerns.  

## 2020-12-18 NOTE — ED Notes (Signed)
Several attempts made to call report.  Number is non-working or connected to fax.  Called [phone] and [phone], which are listed as contact numbers.

## 2020-12-18 NOTE — ED Notes (Signed)
Attempted 6 times to call report.  When call finally answered, facility picked-up telephone and said ride is on the way, then hung up: not allowing nurse to give report. Will review AVS with caregiver on arrival.

## 2021-01-01 ENCOUNTER — Inpatient Hospital Stay: Payer: Medicare Other | Admitting: Neurology

## 2021-01-16 DIAGNOSIS — F32A Depression, unspecified: Secondary | ICD-10-CM | POA: Diagnosis not present

## 2021-01-16 DIAGNOSIS — G4700 Insomnia, unspecified: Secondary | ICD-10-CM | POA: Diagnosis not present

## 2021-01-16 DIAGNOSIS — Z79899 Other long term (current) drug therapy: Secondary | ICD-10-CM | POA: Diagnosis not present

## 2021-01-18 DIAGNOSIS — Z79899 Other long term (current) drug therapy: Secondary | ICD-10-CM | POA: Diagnosis not present

## 2021-01-22 DIAGNOSIS — J449 Chronic obstructive pulmonary disease, unspecified: Secondary | ICD-10-CM | POA: Diagnosis not present

## 2021-01-23 DIAGNOSIS — J449 Chronic obstructive pulmonary disease, unspecified: Secondary | ICD-10-CM | POA: Diagnosis not present

## 2021-01-24 DIAGNOSIS — J449 Chronic obstructive pulmonary disease, unspecified: Secondary | ICD-10-CM | POA: Diagnosis not present

## 2021-01-25 DIAGNOSIS — J449 Chronic obstructive pulmonary disease, unspecified: Secondary | ICD-10-CM | POA: Diagnosis not present

## 2021-01-26 DIAGNOSIS — J449 Chronic obstructive pulmonary disease, unspecified: Secondary | ICD-10-CM | POA: Diagnosis not present

## 2021-01-27 DIAGNOSIS — J449 Chronic obstructive pulmonary disease, unspecified: Secondary | ICD-10-CM | POA: Diagnosis not present

## 2021-01-28 DIAGNOSIS — J449 Chronic obstructive pulmonary disease, unspecified: Secondary | ICD-10-CM | POA: Diagnosis not present

## 2021-01-29 DIAGNOSIS — J449 Chronic obstructive pulmonary disease, unspecified: Secondary | ICD-10-CM | POA: Diagnosis not present

## 2021-01-30 DIAGNOSIS — J449 Chronic obstructive pulmonary disease, unspecified: Secondary | ICD-10-CM | POA: Diagnosis not present

## 2021-01-31 DIAGNOSIS — J449 Chronic obstructive pulmonary disease, unspecified: Secondary | ICD-10-CM | POA: Diagnosis not present

## 2021-02-01 DIAGNOSIS — J449 Chronic obstructive pulmonary disease, unspecified: Secondary | ICD-10-CM | POA: Diagnosis not present

## 2021-02-02 DIAGNOSIS — J449 Chronic obstructive pulmonary disease, unspecified: Secondary | ICD-10-CM | POA: Diagnosis not present

## 2021-02-06 DIAGNOSIS — J441 Chronic obstructive pulmonary disease with (acute) exacerbation: Secondary | ICD-10-CM | POA: Diagnosis not present

## 2021-02-08 DIAGNOSIS — J449 Chronic obstructive pulmonary disease, unspecified: Secondary | ICD-10-CM | POA: Diagnosis not present

## 2021-02-09 DIAGNOSIS — J449 Chronic obstructive pulmonary disease, unspecified: Secondary | ICD-10-CM | POA: Diagnosis not present

## 2021-02-10 DIAGNOSIS — J449 Chronic obstructive pulmonary disease, unspecified: Secondary | ICD-10-CM | POA: Diagnosis not present

## 2021-02-11 DIAGNOSIS — J449 Chronic obstructive pulmonary disease, unspecified: Secondary | ICD-10-CM | POA: Diagnosis not present

## 2021-02-12 DIAGNOSIS — J449 Chronic obstructive pulmonary disease, unspecified: Secondary | ICD-10-CM | POA: Diagnosis not present

## 2021-02-13 DIAGNOSIS — J449 Chronic obstructive pulmonary disease, unspecified: Secondary | ICD-10-CM | POA: Diagnosis not present

## 2021-02-14 DIAGNOSIS — J449 Chronic obstructive pulmonary disease, unspecified: Secondary | ICD-10-CM | POA: Diagnosis not present

## 2021-02-15 DIAGNOSIS — J449 Chronic obstructive pulmonary disease, unspecified: Secondary | ICD-10-CM | POA: Diagnosis not present

## 2021-02-16 DIAGNOSIS — J449 Chronic obstructive pulmonary disease, unspecified: Secondary | ICD-10-CM | POA: Diagnosis not present

## 2021-02-17 DIAGNOSIS — J449 Chronic obstructive pulmonary disease, unspecified: Secondary | ICD-10-CM | POA: Diagnosis not present

## 2021-02-18 DIAGNOSIS — J449 Chronic obstructive pulmonary disease, unspecified: Secondary | ICD-10-CM | POA: Diagnosis not present

## 2021-02-19 ENCOUNTER — Emergency Department (HOSPITAL_COMMUNITY)
Admission: EM | Admit: 2021-02-19 | Discharge: 2021-02-20 | Disposition: A | Discharge status: Home / Self Care | Payer: Medicare Other | Attending: Emergency Medicine | Admitting: Emergency Medicine

## 2021-02-19 ENCOUNTER — Encounter (HOSPITAL_COMMUNITY): Payer: Self-pay | Admitting: Emergency Medicine

## 2021-02-19 DIAGNOSIS — I5032 Chronic diastolic (congestive) heart failure: Secondary | ICD-10-CM | POA: Insufficient documentation

## 2021-02-19 DIAGNOSIS — Z743 Need for continuous supervision: Secondary | ICD-10-CM | POA: Diagnosis not present

## 2021-02-19 DIAGNOSIS — F17210 Nicotine dependence, cigarettes, uncomplicated: Secondary | ICD-10-CM | POA: Insufficient documentation

## 2021-02-19 DIAGNOSIS — I6521 Occlusion and stenosis of right carotid artery: Secondary | ICD-10-CM | POA: Diagnosis not present

## 2021-02-19 DIAGNOSIS — M62838 Other muscle spasm: Secondary | ICD-10-CM | POA: Insufficient documentation

## 2021-02-19 DIAGNOSIS — R6889 Other general symptoms and signs: Secondary | ICD-10-CM | POA: Diagnosis not present

## 2021-02-19 DIAGNOSIS — I672 Cerebral atherosclerosis: Secondary | ICD-10-CM | POA: Diagnosis not present

## 2021-02-19 DIAGNOSIS — M4322 Fusion of spine, cervical region: Secondary | ICD-10-CM | POA: Diagnosis not present

## 2021-02-19 DIAGNOSIS — I110 Hypertensive heart disease with heart failure: Secondary | ICD-10-CM | POA: Insufficient documentation

## 2021-02-19 DIAGNOSIS — Z79899 Other long term (current) drug therapy: Secondary | ICD-10-CM | POA: Insufficient documentation

## 2021-02-19 DIAGNOSIS — Z981 Arthrodesis status: Secondary | ICD-10-CM | POA: Diagnosis not present

## 2021-02-19 DIAGNOSIS — J449 Chronic obstructive pulmonary disease, unspecified: Secondary | ICD-10-CM | POA: Diagnosis not present

## 2021-02-19 DIAGNOSIS — M542 Cervicalgia: Secondary | ICD-10-CM | POA: Diagnosis not present

## 2021-02-19 DIAGNOSIS — I10 Essential (primary) hypertension: Secondary | ICD-10-CM | POA: Diagnosis not present

## 2021-02-19 NOTE — ED Triage Notes (Signed)
[facility]- Pr presents from a facility with neck pain to both sides of neck that started this morning. Pt states she can not move head side to side or up and down well. Pt states she has history of ruptured discs in back.

## 2021-02-20 ENCOUNTER — Emergency Department (HOSPITAL_COMMUNITY): Payer: Medicare Other

## 2021-02-20 DIAGNOSIS — I672 Cerebral atherosclerosis: Secondary | ICD-10-CM | POA: Diagnosis not present

## 2021-02-20 DIAGNOSIS — M4322 Fusion of spine, cervical region: Secondary | ICD-10-CM | POA: Diagnosis not present

## 2021-02-20 DIAGNOSIS — M62838 Other muscle spasm: Secondary | ICD-10-CM | POA: Diagnosis not present

## 2021-02-20 DIAGNOSIS — I6521 Occlusion and stenosis of right carotid artery: Secondary | ICD-10-CM | POA: Diagnosis not present

## 2021-02-20 DIAGNOSIS — J449 Chronic obstructive pulmonary disease, unspecified: Secondary | ICD-10-CM | POA: Diagnosis not present

## 2021-02-20 DIAGNOSIS — Z743 Need for continuous supervision: Secondary | ICD-10-CM | POA: Diagnosis not present

## 2021-02-20 DIAGNOSIS — R6889 Other general symptoms and signs: Secondary | ICD-10-CM | POA: Diagnosis not present

## 2021-02-20 DIAGNOSIS — Z981 Arthrodesis status: Secondary | ICD-10-CM | POA: Diagnosis not present

## 2021-02-20 DIAGNOSIS — Z7401 Bed confinement status: Secondary | ICD-10-CM | POA: Diagnosis not present

## 2021-02-20 LAB — CBC WITH DIFFERENTIAL/PLATELET
Abs Immature Granulocytes: 0.03 10*3/uL (ref 0.00–0.07)
Basophils Absolute: 0.1 10*3/uL (ref 0.0–0.1)
Basophils Relative: 1 %
Eosinophils Absolute: 0.2 10*3/uL (ref 0.0–0.5)
Eosinophils Relative: 2 %
HCT: 45 % (ref 36.0–46.0)
Hemoglobin: 14.7 g/dL (ref 12.0–15.0)
Immature Granulocytes: 0 %
Lymphocytes Relative: 30 %
Lymphs Abs: 3.7 10*3/uL (ref 0.7–4.0)
MCH: 29.2 pg (ref 26.0–34.0)
MCHC: 32.7 g/dL (ref 30.0–36.0)
MCV: 89.3 fL (ref 80.0–100.0)
Monocytes Absolute: 0.9 10*3/uL (ref 0.1–1.0)
Monocytes Relative: 7 %
Neutro Abs: 7.4 10*3/uL (ref 1.7–7.7)
Neutrophils Relative %: 60 %
Platelets: 299 10*3/uL (ref 150–400)
RBC: 5.04 MIL/uL (ref 3.87–5.11)
RDW: 14.2 % (ref 11.5–15.5)
WBC: 12.3 10*3/uL — ABNORMAL HIGH (ref 4.0–10.5)
nRBC: 0 % (ref 0.0–0.2)

## 2021-02-20 LAB — COMPREHENSIVE METABOLIC PANEL
ALT: 18 U/L (ref 0–44)
AST: 23 U/L (ref 15–41)
Albumin: 4.4 g/dL (ref 3.5–5.0)
Alkaline Phosphatase: 108 U/L (ref 38–126)
Anion gap: 9 (ref 5–15)
BUN: 19 mg/dL (ref 8–23)
CO2: 26 mmol/L (ref 22–32)
Calcium: 9.4 mg/dL (ref 8.9–10.3)
Chloride: 104 mmol/L (ref 98–111)
Creatinine, Ser: 0.73 mg/dL (ref 0.44–1.00)
GFR, Estimated: >60 mL/min (ref >60)
Glucose, Bld: 111 mg/dL — ABNORMAL HIGH (ref 70–99)
Potassium: 3.5 mmol/L (ref 3.5–5.1)
Sodium: 139 mmol/L (ref 135–145)
Total Bilirubin: 0.5 mg/dL (ref 0.3–1.2)
Total Protein: 7.7 g/dL (ref 6.5–8.1)

## 2021-02-20 LAB — CK: Total CK: 98 U/L (ref 38–234)

## 2021-02-20 LAB — MAGNESIUM: Magnesium: 1.9 mg/dL (ref 1.7–2.4)

## 2021-02-20 LAB — TROPONIN I (HIGH SENSITIVITY)
Troponin I (High Sensitivity): 6 ng/L (ref <18)
Troponin I (High Sensitivity): 6 ng/L (ref <18)

## 2021-02-20 MED ORDER — IOHEXOL 350 MG/ML SOLN
100.0000 mL | Freq: Once | INTRAVENOUS | Status: AC | PRN
  Administered 2021-02-20: 100 mL via INTRAVENOUS

## 2021-02-20 MED ORDER — FENTANYL CITRATE PF 50 MCG/ML IJ SOSY
50.0000 ug | PREFILLED_SYRINGE | Freq: Once | INTRAMUSCULAR | Status: AC
  Administered 2021-02-20: 50 ug via INTRAVENOUS
  Filled 2021-02-20: qty 1

## 2021-02-20 MED ORDER — OXYCODONE HCL 5 MG PO TABS
5.0000 mg | ORAL_TABLET | Freq: Once | ORAL | Status: AC
  Administered 2021-02-20: 5 mg via ORAL
  Filled 2021-02-20: qty 1

## 2021-02-20 MED ORDER — METHOCARBAMOL 500 MG PO TABS
500.0000 mg | ORAL_TABLET | Freq: Once | ORAL | Status: AC
  Administered 2021-02-20: 500 mg via ORAL
  Filled 2021-02-20: qty 1

## 2021-02-20 MED ORDER — DIAZEPAM 5 MG PO TABS
2.5000 mg | ORAL_TABLET | Freq: Once | ORAL | Status: AC
  Administered 2021-02-20: 2.5 mg via ORAL
  Filled 2021-02-20: qty 1

## 2021-02-20 MED ORDER — METHOCARBAMOL 500 MG PO TABS: 500.0000 mg | ORAL_TABLET | Freq: Three times a day (TID) | ORAL | 0 refills | Status: DC | PRN

## 2021-02-20 MED ORDER — DEXAMETHASONE SODIUM PHOSPHATE 10 MG/ML IJ SOLN
10.0000 mg | Freq: Once | INTRAMUSCULAR | Status: AC
  Administered 2021-02-20: 10 mg via INTRAVENOUS
  Filled 2021-02-20: qty 1

## 2021-02-20 NOTE — ED Notes (Signed)
Patient transported to CT 

## 2021-02-20 NOTE — ED Notes (Signed)
Multiple attempts to contact group home no answer at this time.

## 2021-02-20 NOTE — ED Notes (Signed)
Report called back to [facility]isted living , will call transport

## 2021-02-20 NOTE — Discharge Instructions (Signed)
Take the muscle relaxers as prescribed and follow-up with your doctor for recheck this week.  Return to the ED with worsening pain, fever, weakness in your arms, numbness, tingling, confusion, headache or any other concerns.

## 2021-02-20 NOTE — ED Provider Notes (Signed)
[facility] EMERGENCY DEPARTMENT Provider Note   CSN: [id number] Arrival date & time: 02/19/21  2254     History Chief Complaint  Patient presents with   Neck Pain    [name] is a 70 y.o. female.  Patient presents from assisted living with ongoing bilateral neck pain for the past 2 or 3 days.  States she has chronic neck pain from "bulging disks" for which she takes Tylenol and always has neck pain. She is also prescribed subutex. However over the past several days it has been worse than usual.  She believes she turned her head the wrong way.  Now she has difficulty with moving her head in any direction.  States normally can she cannot lift her head at baseline.  She is having increased pain with movement laterally.  Denies any fall or trauma.  Denies any fever.  Denies any weakness in her arms or legs.  No numbness or tingling.  No chest pain or shortness of breath.  No visual changes.  No headache. No previous neck or back surgery States she was considered for surgery in the past but told that she would not survive the operation  The history is provided by the patient.  Neck Pain Associated symptoms: no chest pain, no fever, no headaches and no weakness       Past Medical History:  Diagnosis Date   Acute blood loss anemia    Arrhythmia    Arthritis    C. difficile colitis    Cancer ([place])    Diverticulosis    Fibromyalgia    GERD (gastroesophageal reflux disease)    GI hemorrhage    H/O: substance abuse ([place]) 2005   narcotic usage due to chronic back pain   Heart murmur    Hypertension    Pancreatic cyst    Pneumococcal meningitis    PONV (postoperative nausea and vomiting)    Renal cyst    Stroke (cerebrum) [facility])     Patient Active Problem List   Diagnosis Date Noted   Protein-calorie malnutrition, severe 08/03/2020   COPD with exacerbation ([place]) 08/02/2020   Acute respiratory failure with hypoxia ([place]) 08/01/2020   Severe recurrent major depression  with psychotic features ([place]) 07/03/2020   Hallucinations    Shortness of breath 04/20/2020   Chronic diastolic CHF (congestive heart failure) ([place]) 04/20/2020   Nausea & vomiting 04/20/2020   Prolonged QT interval 04/20/2020   COPD with acute exacerbation ([place]) 06/20/2017   DOE (dyspnea on exertion) 06/19/2017   Cervical dystonia 11/27/2016   C. difficile colitis 09/10/2016   Abdominal pain 09/08/2016   Acute encephalopathy    Fever    Left hemiparesis ([facility])    Generalized OA    Fibromyalgia    Tobacco abuse    Substance abuse ([place])    Benign essential HTN    Tachycardia    Chronic pain syndrome    Acute blood loss anemia    Leukocytosis    Septic thrombophlebitis of sagittal sinus    Acute respiratory failure ([facility])    Streptococcus pneumoniae meningitis    Streptococcal bacteremia    Meningitis    History of stroke 04/12/2016   Stroke (cerebrum) ([place]) 04/12/2016   Neck pain 04/04/2016   Chronic abdominal pain 02/19/2016   Essential hypertension 02/19/2016   Bacterial conjunctivitis 02/19/2016   Health care maintenance 02/19/2016   Anxiety 05/08/2006   Cigarette smoker 05/08/2006   ASTHMA, UNSPECIFIED 05/08/2006   GERD (gastroesophageal reflux disease) 05/08/2006  CONSTIPATION 05/08/2006   CONVULSIONS, SEIZURES, NOS 05/08/2006    Past Surgical History:  Procedure Laterality Date   ABDOMINAL HYSTERECTOMY     ABDOMINAL SURGERY     APPENDECTOMY     BIOPSY  04/22/2020   Procedure: BIOPSY;  Surgeon: Milus Banister, MD;  Location: [facility];  Service: Gastroenterology;;   BREAST SURGERY     CHOLECYSTECTOMY N/A 09/30/2012   Procedure: LAPAROSCOPIC CHOLECYSTECTOMY WITH INTRAOPERATIVE CHOLANGIOGRAM;  Surgeon: Imogene Burn. Georgette Dover, MD;  Location: [facility];  Service: General;  Laterality: N/A;   ESOPHAGOGASTRODUODENOSCOPY (EGD) WITH PROPOFOL N/A 04/22/2020   Procedure: ESOPHAGOGASTRODUODENOSCOPY (EGD) WITH PROPOFOL;  Surgeon: Milus Banister, MD;  Location: [facility];   Service: Gastroenterology;  Laterality: N/A;   FRACTURE SURGERY     right upper arm   OVARIAN CYST SURGERY       OB History   No obstetric history on file.     Family History  Problem Relation Age of Onset   Alzheimer's disease Mother    Heart disease Father    Prostate cancer Father     Social History   Tobacco Use   Smoking status: Every Day    Packs/day: 0.75    Years: 49.00    Pack years: 36.75    Types: Cigarettes   Smokeless tobacco: Never  Vaping Use   Vaping Use: Never used  Substance Use Topics   Alcohol use: No   Drug use: No    Comment: Methadone for narcotic use    Home Medications Prior to Admission medications   Medication Sig Start Date End Date Taking? Authorizing Provider  acetaminophen (TYLENOL) 500 MG tablet Take 2 tablets (1,000 mg total) by mouth every 6 (six) hours as needed for mild pain. 11/15/20   Daleen Bo, MD  buprenorphine (SUBUTEX) 2 MG SUBL SL tablet Place 2 mg under the tongue 3 (three) times daily. 04/15/20   [provider]  carvedilol (COREG) 6.25 MG tablet Take 1 tablet (6.25 mg total) by mouth 2 (two) times daily with a meal. 11/15/20   Daleen Bo, MD  doxepin (SINEQUAN) 50 MG capsule Take 1 capsule (50 mg total) by mouth daily. 11/15/20   Daleen Bo, MD  LORazepam (ATIVAN) 0.5 MG tablet Take 1 tablet (0.5 mg total) by mouth 2 (two) times daily as needed for anxiety. 11/15/20   Daleen Bo, MD  OLANZapine (ZYPREXA) 10 MG tablet Take 1 tablet (10 mg total) by mouth at bedtime. 11/15/20   Daleen Bo, MD  temazepam (RESTORIL) 22.5 MG capsule Take 1 capsule (22.5 mg total) by mouth at bedtime. 11/15/20   Daleen Bo, MD    Allergies    Tylenol [acetaminophen], Codeine, Nsaids, Tolmetin, and Tramadol  Review of Systems   Review of Systems  Constitutional:  Negative for activity change, appetite change and fever.  HENT:  Negative for congestion and rhinorrhea.   Respiratory:  Negative for cough, chest tightness and  shortness of breath.   Cardiovascular:  Negative for chest pain.  Gastrointestinal:  Negative for abdominal pain, nausea and vomiting.  Genitourinary:  Negative for dysuria.  Musculoskeletal:  Positive for arthralgias, myalgias and neck pain.  Skin:  Negative for rash.  Neurological:  Negative for dizziness, weakness and headaches.   all other systems are negative except as noted in the HPI and PMH.   Physical Exam Updated Vital Signs BP (!) 158/94   Pulse 69   Temp 97.9 F (36.6 C)   Resp 18   Ht 5\' 4"  (  1.626 m)   Wt 43.1 kg   SpO2 95%   BMI 16.31 kg/m   Physical Exam Vitals and nursing note reviewed.  Constitutional:      General: She is not in acute distress.    Appearance: She is well-developed.  HENT:     Head: Normocephalic and atraumatic.     Mouth/Throat:     Pharynx: No oropharyngeal exudate.  Eyes:     Conjunctiva/sclera: Conjunctivae normal.     Pupils: Pupils are equal, round, and reactive to light.     Comments: Lateral gaze palsy of left eye, chronic per patient. Ptosis right side, chronic per patient.   Neck:     Comments: No meningismus. Able to lift head.  Able to rotate side to side without much discomfort.  No meningismus. Paraspinal tenderness bilaterally Cardiovascular:     Rate and Rhythm: Normal rate and regular rhythm.     Heart sounds: Normal heart sounds. No murmur heard. Pulmonary:     Effort: Pulmonary effort is normal. No respiratory distress.     Breath sounds: Normal breath sounds.  Abdominal:     Palpations: Abdomen is soft.     Tenderness: There is no abdominal tenderness. There is no guarding or rebound.  Musculoskeletal:        General: Normal range of motion.     Cervical back: Normal range of motion and neck supple. Tenderness present.     Comments: Equal grip strength and radial pulses bilaterally  Skin:    General: Skin is warm.  Neurological:     Mental Status: She is alert and oriented to person, place, and time.      Cranial Nerves: No cranial nerve deficit.     Motor: No abnormal muscle tone.     Coordination: Coordination normal.     Comments:  5/5 strength throughout. CN 2-12 intact.Equal grip strength.   Psychiatric:        Behavior: Behavior normal.    ED Results / Procedures / Treatments   Labs (all labs ordered are listed, but only abnormal results are displayed) Labs Reviewed  CBC WITH DIFFERENTIAL/PLATELET - Abnormal; Notable for the following components:      Result Value   WBC 12.3 (*)    All other components within normal limits  COMPREHENSIVE METABOLIC PANEL - Abnormal; Notable for the following components:   Glucose, Bld 111 (*)    All other components within normal limits  CK  MAGNESIUM  TROPONIN I (HIGH SENSITIVITY)  TROPONIN I (HIGH SENSITIVITY)    EKG EKG Interpretation  Date/Time:  Tuesday February 20 2021 00:16:17 EST Ventricular Rate:  79 PR Interval:  143 QRS Duration: 97 QT Interval:  420 QTC Calculation: 482 R Axis:   -17 Text Interpretation: Sinus rhythm Biatrial enlargement Borderline left axis deviation Nonspecific T abnrm, anterolateral leads No significant change was found Confirmed by Ezequiel Essex [phone]) on 02/20/2021 12:39:15 AM  Radiology CT ANGIO HEAD NECK W WO CM  Result Date: 02/20/2021 CLINICAL DATA:  Headache, severe, neck pain to both sides of neck that started this morning EXAM: CT ANGIOGRAPHY HEAD AND NECK TECHNIQUE: Multidetector CT imaging of the head and neck was performed using the standard protocol during bolus administration of intravenous contrast. Multiplanar CT image reconstructions and MIPs were obtained to evaluate the vascular anatomy. Carotid stenosis measurements (when applicable) are obtained utilizing NASCET criteria, using the distal internal carotid diameter as the denominator. CONTRAST:  167mL OMNIPAQUE IOHEXOL 350 MG/ML SOLN COMPARISON:  10/30/2020 CT  head, 04/12/2016 CTA head neck. FINDINGS: CT HEAD FINDINGS Brain: No acute  infarct, hemorrhage, mass, mass effect, or midline shift. No hydrocephalus or extra-axial collection. Vascular: Please see CTA findings below. Skull: Normal. Negative for fracture or focal lesion. Sinuses: Imaged portions are clear. Orbits: No acute finding. CTA NECK FINDINGS Aortic arch: Standard branching. Imaged portion shows no evidence of aneurysm or dissection. No significant stenosis of the major arch vessel origins. Right carotid system: No evidence of dissection, stenosis (50% or greater) or occlusion. Focal calcified and noncalcified plaque in the proximal right ICA is not hemodynamically significant. Left carotid system: No evidence of dissection, stenosis (50% or greater) or occlusion. Vertebral arteries: Right dominant system. No evidence of dissection, stenosis (50% or greater) or occlusion. Mild calcifications in the right V1 segment. Skeleton: Degenerative changes in the cervical spine with reversal of the normal cervical lordosis. Fusion of the C5 and C6 vertebral bodies and left facets, which appears degenerative. Other neck: Subcentimeter hypoenhancing nodules in the bilateral thyroid lobes. Upper chest: Apical pleuroparenchymal scarring. Review of the MIP images confirms the above findings CTA HEAD FINDINGS Anterior circulation: Both internal carotid arteries are patent to the termini, without stenosis or other abnormality. A1 segments patent. Normal anterior communicating artery. Anterior cerebral arteries are patent to their distal aspects. No M1 stenosis or occlusion. Normal MCA bifurcations. Distal MCA branches perfused and symmetric. Posterior circulation: Vertebral arteries patent to the vertebrobasilar junction without stenosis. Posterior inferior cerebral arteries patent bilaterally. Basilar patent to its distal aspect. Superior cerebellar arteries patent bilaterally. PCAs perfused to their distal aspects without stenosis. The right posterior communicating artery is visualized. The left  posterior communicating artery is not definitively visualized. Venous sinuses: As permitted by contrast timing, patent. Anatomic variants: None significant. Review of the MIP images confirms the above findings IMPRESSION: 1. No acute intracranial process. 2. No hemodynamically significant stenosis in the neck. 3. No intracranial large vessel occlusion or significant stenosis. 4. Subcentimeter hypoenhancing nodules in the bilateral thyroid lobes. No follow-up imaging is recommended. (Reference: J Am Coll Radiol. 2015 Feb;12(2): 143-50) Electronically Signed   By: Merilyn Baba M.D.   On: 02/20/2021 02:05    Procedures Procedures   Medications Ordered in ED Medications  diazepam (VALIUM) tablet 2.5 mg (has no administration in time range)  fentaNYL (SUBLIMAZE) injection 50 mcg (has no administration in time range)    ED Course  I have reviewed the triage vital signs and the nursing notes.  Pertinent labs & imaging results that were available during my care of the patient were reviewed by me and considered in my medical decision making (see chart for details).    MDM Rules/Calculators/A&P                          Acute on chronic neck pain.  No trauma.  Equal upper extremity strength and pulses.  No fever.  Low suspicion for meningitis given the chronic nature of the pain.  Does have leukocytosis but this appears to be chronic  Imaging obtained shows no cervical fracture.  No acute vascular abnormality.  No aneurysms or stenosis of cerebral or neck vessels.  Patient's pain has improved with muscle relaxers and Valium.  She is also given a dose of steroids. Labs otherwise reassuring.  Troponin is negative.  Low suspicion for atypical ACS  Noted that patient does have a history of meningitis 2018 which was due to pneumococcal bacteremia.  Low suspicion for meningitis at  this time given patient's well appearance, no fever and no meningismus. Patient agrees that she does not think she has  meningitis.  She appears well and has no meningismus and no fever.  She agrees to defer lumbar puncture.  Discussed supportive care with muscle relaxers for likely musculoskeletal neck pain.  No evidence of acute neurosurgical emergency. Follow-up with PCP.  Return to the ED with worsening pain, weakness, numbness, fever, tingling, any concerns Final Clinical Impression(s) / ED Diagnoses Final diagnoses:  Muscle spasms of neck    Rx / DC Orders ED Discharge Orders     None        Ebany Bowermaster, Annie Main, MD 02/20/21 [phone]

## 2021-02-21 DIAGNOSIS — Z87891 Personal history of nicotine dependence: Secondary | ICD-10-CM | POA: Diagnosis not present

## 2021-02-21 DIAGNOSIS — J449 Chronic obstructive pulmonary disease, unspecified: Secondary | ICD-10-CM | POA: Diagnosis not present

## 2021-02-21 DIAGNOSIS — Z79899 Other long term (current) drug therapy: Secondary | ICD-10-CM | POA: Diagnosis not present

## 2021-02-21 DIAGNOSIS — G4700 Insomnia, unspecified: Secondary | ICD-10-CM | POA: Diagnosis not present

## 2021-02-21 DIAGNOSIS — M542 Cervicalgia: Secondary | ICD-10-CM | POA: Diagnosis not present

## 2021-02-21 DIAGNOSIS — G8929 Other chronic pain: Secondary | ICD-10-CM | POA: Diagnosis not present

## 2021-02-26 DIAGNOSIS — Z79899 Other long term (current) drug therapy: Secondary | ICD-10-CM | POA: Diagnosis not present

## 2021-03-08 DIAGNOSIS — J441 Chronic obstructive pulmonary disease with (acute) exacerbation: Secondary | ICD-10-CM | POA: Diagnosis not present

## 2021-03-23 DIAGNOSIS — M542 Cervicalgia: Secondary | ICD-10-CM | POA: Diagnosis not present

## 2021-03-23 DIAGNOSIS — Z79899 Other long term (current) drug therapy: Secondary | ICD-10-CM | POA: Diagnosis not present

## 2021-03-23 DIAGNOSIS — G4700 Insomnia, unspecified: Secondary | ICD-10-CM | POA: Diagnosis not present

## 2021-03-23 DIAGNOSIS — G8929 Other chronic pain: Secondary | ICD-10-CM | POA: Diagnosis not present

## 2021-03-27 DIAGNOSIS — Z79899 Other long term (current) drug therapy: Secondary | ICD-10-CM | POA: Diagnosis not present

## 2021-04-08 DIAGNOSIS — J441 Chronic obstructive pulmonary disease with (acute) exacerbation: Secondary | ICD-10-CM | POA: Diagnosis not present

## 2021-04-08 DIAGNOSIS — J449 Chronic obstructive pulmonary disease, unspecified: Secondary | ICD-10-CM | POA: Diagnosis not present

## 2021-04-08 DIAGNOSIS — I10 Essential (primary) hypertension: Secondary | ICD-10-CM | POA: Diagnosis not present

## 2021-04-08 DIAGNOSIS — E7800 Pure hypercholesterolemia, unspecified: Secondary | ICD-10-CM | POA: Diagnosis not present

## 2021-04-25 DIAGNOSIS — Z5181 Encounter for therapeutic drug level monitoring: Secondary | ICD-10-CM | POA: Diagnosis not present

## 2021-04-26 DIAGNOSIS — Z5181 Encounter for therapeutic drug level monitoring: Secondary | ICD-10-CM | POA: Diagnosis not present

## 2021-05-08 DIAGNOSIS — J441 Chronic obstructive pulmonary disease with (acute) exacerbation: Secondary | ICD-10-CM | POA: Diagnosis not present

## 2021-05-08 DIAGNOSIS — I10 Essential (primary) hypertension: Secondary | ICD-10-CM | POA: Diagnosis not present

## 2021-05-08 DIAGNOSIS — J449 Chronic obstructive pulmonary disease, unspecified: Secondary | ICD-10-CM | POA: Diagnosis not present

## 2021-05-08 DIAGNOSIS — E7800 Pure hypercholesterolemia, unspecified: Secondary | ICD-10-CM | POA: Diagnosis not present

## 2021-05-14 DIAGNOSIS — Z0000 Encounter for general adult medical examination without abnormal findings: Secondary | ICD-10-CM | POA: Diagnosis not present

## 2021-05-14 DIAGNOSIS — Z681 Body mass index (BMI) 19 or less, adult: Secondary | ICD-10-CM | POA: Diagnosis not present

## 2021-05-14 DIAGNOSIS — Z79899 Other long term (current) drug therapy: Secondary | ICD-10-CM | POA: Diagnosis not present

## 2021-05-14 DIAGNOSIS — G4700 Insomnia, unspecified: Secondary | ICD-10-CM | POA: Diagnosis not present

## 2021-05-14 DIAGNOSIS — R030 Elevated blood-pressure reading, without diagnosis of hypertension: Secondary | ICD-10-CM | POA: Diagnosis not present

## 2021-05-14 DIAGNOSIS — G8929 Other chronic pain: Secondary | ICD-10-CM | POA: Diagnosis not present

## 2021-05-14 DIAGNOSIS — M542 Cervicalgia: Secondary | ICD-10-CM | POA: Diagnosis not present

## 2021-05-15 DIAGNOSIS — I5032 Chronic diastolic (congestive) heart failure: Secondary | ICD-10-CM | POA: Diagnosis not present

## 2021-05-15 DIAGNOSIS — E46 Unspecified protein-calorie malnutrition: Secondary | ICD-10-CM | POA: Diagnosis not present

## 2021-05-15 DIAGNOSIS — J449 Chronic obstructive pulmonary disease, unspecified: Secondary | ICD-10-CM | POA: Diagnosis not present

## 2021-05-16 DIAGNOSIS — Z5181 Encounter for therapeutic drug level monitoring: Secondary | ICD-10-CM | POA: Diagnosis not present

## 2021-05-17 DIAGNOSIS — Z5181 Encounter for therapeutic drug level monitoring: Secondary | ICD-10-CM | POA: Diagnosis not present

## 2021-05-21 DIAGNOSIS — Z79899 Other long term (current) drug therapy: Secondary | ICD-10-CM | POA: Diagnosis not present

## 2021-05-22 DIAGNOSIS — Z87891 Personal history of nicotine dependence: Secondary | ICD-10-CM | POA: Diagnosis not present

## 2021-05-31 DIAGNOSIS — Z5181 Encounter for therapeutic drug level monitoring: Secondary | ICD-10-CM | POA: Diagnosis not present

## 2021-06-01 DIAGNOSIS — Z5181 Encounter for therapeutic drug level monitoring: Secondary | ICD-10-CM | POA: Diagnosis not present

## 2021-06-06 DIAGNOSIS — J441 Chronic obstructive pulmonary disease with (acute) exacerbation: Secondary | ICD-10-CM | POA: Diagnosis not present

## 2021-06-06 DIAGNOSIS — Z5181 Encounter for therapeutic drug level monitoring: Secondary | ICD-10-CM | POA: Diagnosis not present

## 2021-06-07 DIAGNOSIS — Z5181 Encounter for therapeutic drug level monitoring: Secondary | ICD-10-CM | POA: Diagnosis not present

## 2021-06-14 DIAGNOSIS — Z79899 Other long term (current) drug therapy: Secondary | ICD-10-CM | POA: Diagnosis not present

## 2021-06-14 DIAGNOSIS — M542 Cervicalgia: Secondary | ICD-10-CM | POA: Diagnosis not present

## 2021-06-14 DIAGNOSIS — Z681 Body mass index (BMI) 19 or less, adult: Secondary | ICD-10-CM | POA: Diagnosis not present

## 2021-06-14 DIAGNOSIS — G4700 Insomnia, unspecified: Secondary | ICD-10-CM | POA: Diagnosis not present

## 2021-06-14 DIAGNOSIS — G8929 Other chronic pain: Secondary | ICD-10-CM | POA: Diagnosis not present

## 2021-06-14 DIAGNOSIS — J209 Acute bronchitis, unspecified: Secondary | ICD-10-CM | POA: Diagnosis not present

## 2021-06-20 DIAGNOSIS — Z5181 Encounter for therapeutic drug level monitoring: Secondary | ICD-10-CM | POA: Diagnosis not present

## 2021-06-21 DIAGNOSIS — Z5181 Encounter for therapeutic drug level monitoring: Secondary | ICD-10-CM | POA: Diagnosis not present

## 2021-06-21 DIAGNOSIS — Z79899 Other long term (current) drug therapy: Secondary | ICD-10-CM | POA: Diagnosis not present

## 2021-07-07 DIAGNOSIS — J441 Chronic obstructive pulmonary disease with (acute) exacerbation: Secondary | ICD-10-CM | POA: Diagnosis not present

## 2021-07-12 DIAGNOSIS — G8929 Other chronic pain: Secondary | ICD-10-CM | POA: Diagnosis not present

## 2021-07-12 DIAGNOSIS — Z681 Body mass index (BMI) 19 or less, adult: Secondary | ICD-10-CM | POA: Diagnosis not present

## 2021-07-12 DIAGNOSIS — Z79899 Other long term (current) drug therapy: Secondary | ICD-10-CM | POA: Diagnosis not present

## 2021-07-12 DIAGNOSIS — G4700 Insomnia, unspecified: Secondary | ICD-10-CM | POA: Diagnosis not present

## 2021-07-12 DIAGNOSIS — M542 Cervicalgia: Secondary | ICD-10-CM | POA: Diagnosis not present

## 2021-07-17 DIAGNOSIS — Z79899 Other long term (current) drug therapy: Secondary | ICD-10-CM | POA: Diagnosis not present

## 2021-07-21 ENCOUNTER — Emergency Department (HOSPITAL_COMMUNITY): Payer: Medicare Other

## 2021-07-21 ENCOUNTER — Emergency Department (HOSPITAL_COMMUNITY)
Admission: EM | Admit: 2021-07-21 | Discharge: 2021-07-21 | Disposition: A | Discharge status: Nursing Home (Includes ICF) | Payer: Medicare Other | Attending: Emergency Medicine | Admitting: Emergency Medicine

## 2021-07-21 ENCOUNTER — Other Ambulatory Visit: Payer: Self-pay

## 2021-07-21 DIAGNOSIS — Z872 Personal history of diseases of the skin and subcutaneous tissue: Secondary | ICD-10-CM | POA: Diagnosis not present

## 2021-07-21 DIAGNOSIS — R220 Localized swelling, mass and lump, head: Secondary | ICD-10-CM | POA: Diagnosis not present

## 2021-07-21 DIAGNOSIS — L03213 Periorbital cellulitis: Secondary | ICD-10-CM | POA: Diagnosis not present

## 2021-07-21 DIAGNOSIS — Z85828 Personal history of other malignant neoplasm of skin: Secondary | ICD-10-CM | POA: Insufficient documentation

## 2021-07-21 DIAGNOSIS — J45909 Unspecified asthma, uncomplicated: Secondary | ICD-10-CM | POA: Insufficient documentation

## 2021-07-21 DIAGNOSIS — R609 Edema, unspecified: Secondary | ICD-10-CM | POA: Diagnosis not present

## 2021-07-21 DIAGNOSIS — I10 Essential (primary) hypertension: Secondary | ICD-10-CM | POA: Diagnosis not present

## 2021-07-21 DIAGNOSIS — R531 Weakness: Secondary | ICD-10-CM | POA: Diagnosis not present

## 2021-07-21 DIAGNOSIS — Z743 Need for continuous supervision: Secondary | ICD-10-CM | POA: Diagnosis not present

## 2021-07-21 LAB — CBC
HCT: 51.4 % — ABNORMAL HIGH (ref 36.0–46.0)
Hemoglobin: 16.6 g/dL — ABNORMAL HIGH (ref 12.0–15.0)
MCH: 30.2 pg (ref 26.0–34.0)
MCHC: 32.3 g/dL (ref 30.0–36.0)
MCV: 93.6 fL (ref 80.0–100.0)
Platelets: 330 10*3/uL (ref 150–400)
RBC: 5.49 MIL/uL — ABNORMAL HIGH (ref 3.87–5.11)
RDW: 14.3 % (ref 11.5–15.5)
WBC: 11.8 10*3/uL — ABNORMAL HIGH (ref 4.0–10.5)
nRBC: 0 % (ref 0.0–0.2)

## 2021-07-21 LAB — BASIC METABOLIC PANEL
Anion gap: 6 (ref 5–15)
BUN: 11 mg/dL (ref 8–23)
CO2: 34 mmol/L — ABNORMAL HIGH (ref 22–32)
Calcium: 9.8 mg/dL (ref 8.9–10.3)
Chloride: 100 mmol/L (ref 98–111)
Creatinine, Ser: 0.74 mg/dL (ref 0.44–1.00)
GFR, Estimated: >60 mL/min (ref >60)
Glucose, Bld: 111 mg/dL — ABNORMAL HIGH (ref 70–99)
Potassium: 3.9 mmol/L (ref 3.5–5.1)
Sodium: 140 mmol/L (ref 135–145)

## 2021-07-21 MED ORDER — SULFAMETHOXAZOLE-TRIMETHOPRIM 800-160 MG PO TABS: 1.0000 | ORAL_TABLET | Freq: Two times a day (BID) | ORAL | 0 refills | Status: AC

## 2021-07-21 MED ORDER — IOHEXOL 300 MG/ML  SOLN
75.0000 mL | Freq: Once | INTRAMUSCULAR | Status: AC | PRN
  Administered 2021-07-21: 75 mL via INTRAVENOUS

## 2021-07-21 MED ORDER — CLINDAMYCIN HCL 300 MG PO CAPS: 300.0000 mg | ORAL_CAPSULE | Freq: Three times a day (TID) | ORAL | 0 refills | Status: DC

## 2021-07-21 MED ORDER — AMOXICILLIN-POT CLAVULANATE 875-125 MG PO TABS
1.0000 | ORAL_TABLET | Freq: Once | ORAL | Status: AC
  Administered 2021-07-21: 1 via ORAL
  Filled 2021-07-21: qty 1

## 2021-07-21 MED ORDER — CLINDAMYCIN HCL 150 MG PO CAPS
300.0000 mg | ORAL_CAPSULE | Freq: Once | ORAL | Status: AC
  Administered 2021-07-21: 300 mg via ORAL
  Filled 2021-07-21: qty 2

## 2021-07-21 NOTE — Discharge Instructions (Addendum)
If the swelling, redness of the right eye worsens please return to the emergency department for further evaluation otherwise take with the antibiotics I am prescribing for the entire course. ? ?I prescribed Clindamycin, bactrim for infection. I recommend you eat these with food on a stomach, and take a probiotic while you are taking them to prevent stomach upset, diarrhea. ?

## 2021-07-21 NOTE — ED Provider Notes (Signed)
?[facility] ?Provider Note ? ? ?CSN: [id number] ?Arrival date & time: 07/21/21  1043 ? ?  ? ?History ? ?Chief Complaint  ?Patient presents with  ? Facial Swelling  ? Hand Problem  ? ? ?[name] is a 71 y.o. female with past medical history significant for cervical dystonia, anxiety, tobacco abuse, asthma, acid reflux, seizures, history of cancer, stroke who presents with some right eye swelling, left hand swelling.  She denies trauma, pain, redness, fever, chills.  Patient denies any known injury or contact with the right eye.  She does have some eyeliner in place around the right eye.  She denies significant pain, no pain with extraocular movements, slightly decreased vision secondary to swelling.  Of the hand she denies any numbness, tingling, difficulty with grasping or movement.  She reports that the swelling was worse, but improved on its own without treatment. ? ?HPI ? ?  ? ?Home Medications ?Prior to Admission medications   ?Medication Sig Start Date End Date Taking? Authorizing Provider  ?clindamycin (CLEOCIN) 300 MG capsule Take 1 capsule (300 mg total) by mouth 3 (three) times daily. 07/21/21  Yes Jahson Emanuele H, PA-C  ?sulfamethoxazole-trimethoprim (BACTRIM DS) 800-160 MG tablet Take 1 tablet by mouth 2 (two) times daily for 5 days. 07/21/21 07/26/21 Yes Manhattan Mccuen H, PA-C  ?acetaminophen (TYLENOL) 500 MG tablet Take 2 tablets (1,000 mg total) by mouth every 6 (six) hours as needed for mild pain. 11/15/20   Daleen Bo, MD  ?buprenorphine (SUBUTEX) 2 MG SUBL SL tablet Place 2 mg under the tongue 3 (three) times daily. 04/15/20   [provider]  ?carvedilol (COREG) 6.25 MG tablet Take 1 tablet (6.25 mg total) by mouth 2 (two) times daily with a meal. 11/15/20   Daleen Bo, MD  ?doxepin (SINEQUAN) 50 MG capsule Take 1 capsule (50 mg total) by mouth daily. 11/15/20   Daleen Bo, MD  ?LORazepam (ATIVAN) 0.5 MG tablet Take 1 tablet (0.5 mg total) by mouth 2  (two) times daily as needed for anxiety. 11/15/20   Daleen Bo, MD  ?methocarbamol (ROBAXIN) 500 MG tablet Take 1 tablet (500 mg total) by mouth every 8 (eight) hours as needed for muscle spasms. 02/20/21   Rancour, Annie Main, MD  ?OLANZapine (ZYPREXA) 10 MG tablet Take 1 tablet (10 mg total) by mouth at bedtime. 11/15/20   Daleen Bo, MD  ?temazepam (RESTORIL) 22.5 MG capsule Take 1 capsule (22.5 mg total) by mouth at bedtime. 11/15/20   Daleen Bo, MD  ?   ? ?Allergies    ?Tylenol [acetaminophen], Codeine, Nsaids, Tolmetin, and Tramadol   ? ?Review of Systems   ?Review of Systems  ?Eyes:   ?     Eye swelling  ?All other systems reviewed and are negative. ? ?Physical Exam ?Updated Vital Signs ?BP 139/83   Pulse 83   Temp 97.7 ?F (36.5 ?C) (Oral)   Resp 16   Ht '5\' 3"'$  (1.6 m)   Wt 41.7 kg   SpO2 91%   BMI 16.30 kg/m?  ?Physical Exam ?Vitals and nursing note reviewed.  ?Constitutional:   ?   General: She is not in acute distress. ?   Appearance: Normal appearance.  ?HENT:  ?   Head: Normocephalic and atraumatic.  ?Eyes:  ?   General:     ?   Right eye: No discharge.     ?   Left eye: No discharge.  ?   Comments: With significant periorbital swelling of the  right eye without redness, tenderness, purulent drainage.  She has normal conjunctive a on the right, pupils are equal round reactive to light bilaterally.  Normal extraocular movements of the right eye with no pain with EOMs.  ?Cardiovascular:  ?   Rate and Rhythm: Normal rate and regular rhythm.  ?   Pulses: Normal pulses.  ?   Comments: Radial, ulnar pulses 2+ bilaterally of upper extremities ?Pulmonary:  ?   Effort: Pulmonary effort is normal. No respiratory distress.  ?Musculoskeletal:     ?   General: No deformity.  ?   Comments: Patient with some swelling especially over the first and second MTP of the left hand without redness, tenderness to palpation.  She has normal flexion, extension of all the digits, no tenderness palpation in the flexor  tendon distribution.  ?Skin: ?   General: Skin is warm and dry.  ?Neurological:  ?   Mental Status: She is alert and oriented to person, place, and time.  ?Psychiatric:     ?   Mood and Affect: Mood normal.     ?   Behavior: Behavior normal.  ? ? ?ED Results / Procedures / Treatments   ?Labs ?(all labs ordered are listed, but only abnormal results are displayed) ?Labs Reviewed  ?CBC - Abnormal; Notable for the following components:  ?    Result Value  ? WBC 11.8 (*)   ? RBC 5.49 (*)   ? Hemoglobin 16.6 (*)   ? HCT 51.4 (*)   ? All other components within normal limits  ?BASIC METABOLIC PANEL - Abnormal; Notable for the following components:  ? CO2 34 (*)   ? Glucose, Bld 111 (*)   ? All other components within normal limits  ? ? ?EKG ?None ? ?Radiology ?CT Maxillofacial W Contrast ? ?Result Date: 07/21/2021 ?CLINICAL DATA:  Maxillary/facial abscess.  Right eye swelling. EXAM: CT MAXILLOFACIAL WITH CONTRAST TECHNIQUE: Multidetector CT imaging of the maxillofacial structures was performed with intravenous contrast. Multiplanar CT image reconstructions were also generated. RADIATION DOSE REDUCTION: This exam was performed according to the departmental dose-optimization program which includes automated exposure control, adjustment of the mA and/or kV according to patient size and/or use of iterative reconstruction technique. CONTRAST:  37m OMNIPAQUE IOHEXOL 300 MG/ML  SOLN COMPARISON:  Head and neck CTA 02/20/2021 FINDINGS: Osseous: No acute fracture or suspicious osseous lesion. Largely absent dentition. Caries and periapical lucencies involving remaining mandibular incisors. Orbits: Moderate right orbital preseptal soft tissue swelling. No evidence of a drainable fluid collection or postseptal inflammation. Grossly intact globes. Unremarkable appearance of the lacrimal glands and extraocular muscles. Sinuses: Paranasal sinuses and mastoid air cells are clear. Soft tissues: No additional findings. Limited intracranial:  Unremarkable. IMPRESSION: Right periorbital preseptal soft tissue swelling compatible with cellulitis. No evidence of abscess or postseptal cellulitis. Electronically Signed   By: ALogan BoresM.D.   On: 07/21/2021 16:48   ? ?Procedures ?Procedures  ? ? ?Medications Ordered in ED ?Medications  ?amoxicillin-clavulanate (AUGMENTIN) 875-125 MG per tablet 1 tablet (has no administration in time range)  ?clindamycin (CLEOCIN) capsule 300 mg (has no administration in time range)  ?iohexol (OMNIPAQUE) 300 MG/ML solution 75 mL (75 mLs Intravenous Contrast Given 07/21/21 1436)  ? ? ?ED Course/ Medical Decision Making/ A&P ?Clinical Course as of 07/21/21 1703  ?Sat Jul 21, 2021  ?1237 We will assess for abscess, or other mass in the right orbital region causing patient's significant swelling.  Basic labs, CT maxillofacial with contrast per Dr. KTomi Bamberger [  CP]  ?  ?Clinical Course User Index ?[CP] Trigg Delarocha H, PA-C  ? ?                        ?Medical Decision Making ?Amount and/or Complexity of Data Reviewed ?Labs: ordered. ?Radiology: ordered. ? ?Risk ?Prescription drug management. ? ? ?This patient is a 71 y.o. female who presents to the ED for concern of right eye swelling, left hand swelling without pain, redness, purulent drainage, vision changes, this involves an extensive number of treatment options, and is a complaint that carries with it a high risk of complications and morbidity. The emergent differential diagnosis prior to evaluation includes, but is not limited to, orbital cellulitis, periorbital cellulitis, allergic reaction, Inflammation or infection of the lacrimal gland, blepharitis, less concern for retrobulbar abscess, orbital tumor, nephrotic syndrome versus other at this time.  Differential for hand swelling includes occult injury that patient does not remember, gout, less clinical concern for acute proximal tenosynovitis, cellulitis, or other infection of the hand based on physical exam..  ? ?This is  not an exhaustive differential.  ? ?Past Medical History / Co-morbidities / Social History: ?Overall noncontributory past medical history, history of hypertension, cancer, stroke, arrhythmia, acid reflux ? ?A

## 2021-07-21 NOTE — ED Notes (Signed)
Spoke with Trevor Mace, PCA at [facility], informed nurse transportation arrangements are being made  to transport patient back to facility. ?

## 2021-07-21 NOTE — ED Triage Notes (Signed)
Pt arrived by EMS from group home complaining of right  eye swelling and left hand swelling.  ? ?Pt denies trauma to regions, denies pain at this time ? ?Pt is anxious and wants to leave ?

## 2021-07-24 DIAGNOSIS — Z86010 Personal history of colonic polyps: Secondary | ICD-10-CM | POA: Diagnosis not present

## 2021-08-06 DIAGNOSIS — J441 Chronic obstructive pulmonary disease with (acute) exacerbation: Secondary | ICD-10-CM | POA: Diagnosis not present

## 2021-08-09 DIAGNOSIS — Z681 Body mass index (BMI) 19 or less, adult: Secondary | ICD-10-CM | POA: Diagnosis not present

## 2021-08-09 DIAGNOSIS — M542 Cervicalgia: Secondary | ICD-10-CM | POA: Diagnosis not present

## 2021-08-09 DIAGNOSIS — Z79899 Other long term (current) drug therapy: Secondary | ICD-10-CM | POA: Diagnosis not present

## 2021-08-09 DIAGNOSIS — G8929 Other chronic pain: Secondary | ICD-10-CM | POA: Diagnosis not present

## 2021-08-09 DIAGNOSIS — G4700 Insomnia, unspecified: Secondary | ICD-10-CM | POA: Diagnosis not present

## 2021-08-13 DIAGNOSIS — Z79899 Other long term (current) drug therapy: Secondary | ICD-10-CM | POA: Diagnosis not present

## 2021-08-16 DIAGNOSIS — Z0000 Encounter for general adult medical examination without abnormal findings: Secondary | ICD-10-CM | POA: Diagnosis not present

## 2021-08-16 DIAGNOSIS — G8929 Other chronic pain: Secondary | ICD-10-CM | POA: Diagnosis not present

## 2021-08-16 DIAGNOSIS — E7849 Other hyperlipidemia: Secondary | ICD-10-CM | POA: Diagnosis not present

## 2021-08-16 DIAGNOSIS — E46 Unspecified protein-calorie malnutrition: Secondary | ICD-10-CM | POA: Diagnosis not present

## 2021-08-16 DIAGNOSIS — I5032 Chronic diastolic (congestive) heart failure: Secondary | ICD-10-CM | POA: Diagnosis not present

## 2021-08-16 DIAGNOSIS — Z79899 Other long term (current) drug therapy: Secondary | ICD-10-CM | POA: Diagnosis not present

## 2021-08-16 DIAGNOSIS — J449 Chronic obstructive pulmonary disease, unspecified: Secondary | ICD-10-CM | POA: Diagnosis not present

## 2021-08-16 DIAGNOSIS — Z681 Body mass index (BMI) 19 or less, adult: Secondary | ICD-10-CM | POA: Diagnosis not present

## 2021-09-05 DIAGNOSIS — R0602 Shortness of breath: Secondary | ICD-10-CM | POA: Diagnosis not present

## 2021-09-05 DIAGNOSIS — M542 Cervicalgia: Secondary | ICD-10-CM | POA: Diagnosis not present

## 2021-09-05 DIAGNOSIS — Z79899 Other long term (current) drug therapy: Secondary | ICD-10-CM | POA: Diagnosis not present

## 2021-09-05 DIAGNOSIS — G4700 Insomnia, unspecified: Secondary | ICD-10-CM | POA: Diagnosis not present

## 2021-09-05 DIAGNOSIS — G8929 Other chronic pain: Secondary | ICD-10-CM | POA: Diagnosis not present

## 2021-09-06 DIAGNOSIS — J441 Chronic obstructive pulmonary disease with (acute) exacerbation: Secondary | ICD-10-CM | POA: Diagnosis not present

## 2021-09-10 DIAGNOSIS — Z79899 Other long term (current) drug therapy: Secondary | ICD-10-CM | POA: Diagnosis not present

## 2021-10-02 DIAGNOSIS — Z79899 Other long term (current) drug therapy: Secondary | ICD-10-CM | POA: Diagnosis not present

## 2021-10-02 DIAGNOSIS — M542 Cervicalgia: Secondary | ICD-10-CM | POA: Diagnosis not present

## 2021-10-02 DIAGNOSIS — F32A Depression, unspecified: Secondary | ICD-10-CM | POA: Diagnosis not present

## 2021-10-02 DIAGNOSIS — G4700 Insomnia, unspecified: Secondary | ICD-10-CM | POA: Diagnosis not present

## 2021-10-02 DIAGNOSIS — R64 Cachexia: Secondary | ICD-10-CM | POA: Diagnosis not present

## 2021-10-02 DIAGNOSIS — G8929 Other chronic pain: Secondary | ICD-10-CM | POA: Diagnosis not present

## 2021-10-04 DIAGNOSIS — Z79899 Other long term (current) drug therapy: Secondary | ICD-10-CM | POA: Diagnosis not present

## 2021-10-29 DIAGNOSIS — Z79899 Other long term (current) drug therapy: Secondary | ICD-10-CM | POA: Diagnosis not present

## 2021-10-29 DIAGNOSIS — R0602 Shortness of breath: Secondary | ICD-10-CM | POA: Diagnosis not present

## 2021-10-29 DIAGNOSIS — Z681 Body mass index (BMI) 19 or less, adult: Secondary | ICD-10-CM | POA: Diagnosis not present

## 2021-10-29 DIAGNOSIS — M542 Cervicalgia: Secondary | ICD-10-CM | POA: Diagnosis not present

## 2021-10-29 DIAGNOSIS — R634 Abnormal weight loss: Secondary | ICD-10-CM | POA: Diagnosis not present

## 2021-10-29 DIAGNOSIS — G4700 Insomnia, unspecified: Secondary | ICD-10-CM | POA: Diagnosis not present

## 2021-10-29 DIAGNOSIS — J449 Chronic obstructive pulmonary disease, unspecified: Secondary | ICD-10-CM | POA: Diagnosis not present

## 2021-10-29 DIAGNOSIS — R64 Cachexia: Secondary | ICD-10-CM | POA: Diagnosis not present

## 2021-10-30 DIAGNOSIS — R634 Abnormal weight loss: Secondary | ICD-10-CM | POA: Diagnosis not present

## 2021-10-30 DIAGNOSIS — R1310 Dysphagia, unspecified: Secondary | ICD-10-CM | POA: Diagnosis not present

## 2021-10-30 DIAGNOSIS — Z86010 Personal history of colonic polyps: Secondary | ICD-10-CM | POA: Diagnosis not present

## 2021-10-30 DIAGNOSIS — K861 Other chronic pancreatitis: Secondary | ICD-10-CM | POA: Diagnosis not present

## 2021-11-01 DIAGNOSIS — Z79899 Other long term (current) drug therapy: Secondary | ICD-10-CM | POA: Diagnosis not present

## 2021-11-02 ENCOUNTER — Other Ambulatory Visit (HOSPITAL_COMMUNITY): Payer: Self-pay | Admitting: Geriatric Medicine

## 2021-11-02 ENCOUNTER — Other Ambulatory Visit: Payer: Self-pay | Admitting: Geriatric Medicine

## 2021-11-02 DIAGNOSIS — R1310 Dysphagia, unspecified: Secondary | ICD-10-CM

## 2021-11-20 ENCOUNTER — Other Ambulatory Visit (HOSPITAL_COMMUNITY): Payer: Medicare Other

## 2021-11-20 ENCOUNTER — Encounter (HOSPITAL_COMMUNITY): Payer: Self-pay

## 2021-11-20 ENCOUNTER — Ambulatory Visit (HOSPITAL_COMMUNITY): Payer: Medicare Other

## 2021-11-26 DIAGNOSIS — M7918 Myalgia, other site: Secondary | ICD-10-CM | POA: Diagnosis not present

## 2021-11-26 DIAGNOSIS — S31829A Unspecified open wound of left buttock, initial encounter: Secondary | ICD-10-CM | POA: Diagnosis not present

## 2021-11-26 DIAGNOSIS — Z681 Body mass index (BMI) 19 or less, adult: Secondary | ICD-10-CM | POA: Diagnosis not present

## 2021-12-03 DIAGNOSIS — F32A Depression, unspecified: Secondary | ICD-10-CM | POA: Diagnosis not present

## 2021-12-03 DIAGNOSIS — Z79899 Other long term (current) drug therapy: Secondary | ICD-10-CM | POA: Diagnosis not present

## 2021-12-03 DIAGNOSIS — Z681 Body mass index (BMI) 19 or less, adult: Secondary | ICD-10-CM | POA: Diagnosis not present

## 2021-12-03 DIAGNOSIS — G4700 Insomnia, unspecified: Secondary | ICD-10-CM | POA: Diagnosis not present

## 2021-12-03 DIAGNOSIS — M542 Cervicalgia: Secondary | ICD-10-CM | POA: Diagnosis not present

## 2021-12-04 DIAGNOSIS — R634 Abnormal weight loss: Secondary | ICD-10-CM | POA: Diagnosis not present

## 2021-12-06 ENCOUNTER — Encounter (HOSPITAL_BASED_OUTPATIENT_CLINIC_OR_DEPARTMENT_OTHER): Payer: Medicare Other | Admitting: Internal Medicine

## 2021-12-06 DIAGNOSIS — Z79899 Other long term (current) drug therapy: Secondary | ICD-10-CM | POA: Diagnosis not present

## 2021-12-14 ENCOUNTER — Ambulatory Visit (HOSPITAL_BASED_OUTPATIENT_CLINIC_OR_DEPARTMENT_OTHER): Payer: Medicare Other | Admitting: Internal Medicine

## 2021-12-20 DIAGNOSIS — R0602 Shortness of breath: Secondary | ICD-10-CM | POA: Diagnosis not present

## 2021-12-20 DIAGNOSIS — J449 Chronic obstructive pulmonary disease, unspecified: Secondary | ICD-10-CM | POA: Diagnosis not present

## 2021-12-20 DIAGNOSIS — R634 Abnormal weight loss: Secondary | ICD-10-CM | POA: Diagnosis not present

## 2021-12-20 DIAGNOSIS — F17210 Nicotine dependence, cigarettes, uncomplicated: Secondary | ICD-10-CM | POA: Diagnosis not present

## 2021-12-20 DIAGNOSIS — F32A Depression, unspecified: Secondary | ICD-10-CM | POA: Diagnosis not present

## 2021-12-24 ENCOUNTER — Ambulatory Visit (HOSPITAL_BASED_OUTPATIENT_CLINIC_OR_DEPARTMENT_OTHER): Payer: Medicare Other | Admitting: General Surgery

## 2021-12-25 DIAGNOSIS — F32A Depression, unspecified: Secondary | ICD-10-CM | POA: Diagnosis not present

## 2021-12-25 DIAGNOSIS — M542 Cervicalgia: Secondary | ICD-10-CM | POA: Diagnosis not present

## 2021-12-25 DIAGNOSIS — Z681 Body mass index (BMI) 19 or less, adult: Secondary | ICD-10-CM | POA: Diagnosis not present

## 2021-12-25 DIAGNOSIS — G4700 Insomnia, unspecified: Secondary | ICD-10-CM | POA: Diagnosis not present

## 2021-12-25 DIAGNOSIS — Z79899 Other long term (current) drug therapy: Secondary | ICD-10-CM | POA: Diagnosis not present

## 2021-12-27 DIAGNOSIS — Z79899 Other long term (current) drug therapy: Secondary | ICD-10-CM | POA: Diagnosis not present

## 2021-12-31 ENCOUNTER — Ambulatory Visit (HOSPITAL_BASED_OUTPATIENT_CLINIC_OR_DEPARTMENT_OTHER): Payer: Medicare Other | Admitting: Internal Medicine

## 2022-01-02 ENCOUNTER — Encounter (HOSPITAL_BASED_OUTPATIENT_CLINIC_OR_DEPARTMENT_OTHER): Payer: Medicare Other | Attending: General Surgery | Admitting: Physician Assistant

## 2022-01-02 DIAGNOSIS — L89154 Pressure ulcer of sacral region, stage 4: Secondary | ICD-10-CM | POA: Insufficient documentation

## 2022-01-02 DIAGNOSIS — E43 Unspecified severe protein-calorie malnutrition: Secondary | ICD-10-CM | POA: Diagnosis not present

## 2022-01-02 DIAGNOSIS — M6281 Muscle weakness (generalized): Secondary | ICD-10-CM | POA: Insufficient documentation

## 2022-01-03 NOTE — Progress Notes (Signed)
[name] ([id number]) [phone].pdf Page 1 of 4 Visit Report for 01/02/2022 Abuse Risk Screen Details Patient Name: Date of Service: [name]. 01/02/2022 10:30 A M Medical Record Number: [id number] Patient Account Number: [id number] Date of Birth/Sex: Treating RN: [birth year] (71 y.o. F) Primary Care Saeed Toren: Marcelline Mates., RO BERT Other Clinician: Referring Rocky Gladden: Treating Kristell Wooding/Extender: Reatha Harps in Treatment: 0 Abuse Risk Screen Items Answer ABUSE RISK SCREEN: Has anyone close to you tried to hurt or harm you recentlyo No Do you feel uncomfortable with anyone in your familyo No Has anyone forced you do things that you didnt want to doo No Electronic Signature(s) Signed: 01/03/2022 4:54:57 PM By: Thayer Dallas Entered By: Thayer Dallas on 01/02/2022 11:15:34 -------------------------------------------------------------------------------- Activities of Daily Living Details Patient Name: Date of Service: [name] 01/02/2022 10:30 A M Medical Record Number: [id number] Patient Account Number: [id number] Date of Birth/Sex: Treating RN: 01-06-1951 (71 y.o. F) Primary Care Kamaiya Antilla: Marcelline Mates., RO BERT Other Clinician: Referring Grayson Pfefferle: Treating Cristofer Yaffe/Extender: Reatha Harps in Treatment: 0 Activities of Daily Living Items Answer Activities of Daily Living (Please select one for each item) Drive Automobile Not Able T Medications ake Need Assistance Use T elephone Completely Able Care for Appearance Completely Able Use T oilet Completely Able Bath / Shower Completely Able Dress Self Completely Able Feed Self Completely Able Walk Completely Able Get In / Out Bed Completely Able Housework Not Able Prepare Meals Not Able Handle Money Need Assistance Shop for Self Not Able Electronic Signature(s) Signed: 01/03/2022 4:54:57 PM By: Thayer Dallas Entered  By: Thayer Dallas on 01/02/2022 11:16:09 [name] Cardenas ([id number]) [id number] Page 2 of 4 -------------------------------------------------------------------------------- Education Screening Details Patient Name: Date of Service: [name] 01/02/2022 10:30 A M Medical Record Number: [id number] Patient Account Number: [id number] Date of Birth/Sex: Treating RN: August 25, 1950 (71 y.o. F) Primary Care Bambie Pizzolato: Marcelline Mates., RO BERT Other Clinician: Referring Kiyanna Biegler: Treating Man Bonneau/Extender: Reatha Harps in Treatment: 0 Learning Preferences/Education Level/Primary Language Learning Preference: Explanation, Demonstration, Printed Material Highest Education Level: High School Preferred Language: English Cognitive Barrier Language Barrier: No Translator Needed: No Memory Deficit: No Emotional Barrier: No Cultural/Religious Beliefs Affecting Medical Care: No Physical Barrier Impaired Vision: No Impaired Hearing: No Decreased Hand dexterity: No Knowledge/Comprehension Knowledge Level: High Comprehension Level: High Ability to understand written instructions: High Ability to understand verbal instructions: High Motivation Anxiety Level: Calm Cooperation: Cooperative Education Importance: Acknowledges Need Interest in Health Problems: Asks Questions Perception: Coherent Willingness to Engage in Self-Management High Activities: Readiness to Engage in Self-Management High Activities: Electronic Signature(s) Signed: 01/03/2022 4:54:57 PM By: Thayer Dallas Entered By: Thayer Dallas on 01/02/2022 11:16:38 -------------------------------------------------------------------------------- Fall Risk Assessment Details Patient Name: Date of Service: [name]. 01/02/2022 10:30 A M Medical Record Number: [id number] Patient Account Number: [id number] Date of Birth/Sex: Treating RN: 21-Feb-1951 (71 y.o.  F) Primary Care Hakim Minniefield: Marcelline Mates., RO BERT Other Clinician: Referring Clydell Alberts: Treating Amabel Stmarie/Extender: Reatha Harps in Treatment: 0 Fall Risk Assessment Items Have you had 2 or more falls in the last 12 monthso 0 No Have you had any fall that resulted in injury in the last 12 monthso 0 No [name], [name] ([id number]) [id number] Page 3 of 4 FALLS RISK SCREEN History of falling - immediate or within 3 months 0 No Secondary diagnosis (Do you have 2 or more medical  diagnoseso) 0 No Ambulatory aid None/bed rest/wheelchair/nurse 0 Yes Crutches/cane/walker 0 No Furniture 0 No Intravenous therapy Access/Saline/Heparin Lock 0 No Gait/Transferring Normal/ bed rest/ wheelchair 0 Yes Weak (short steps with or without shuffle, stooped but able to lift head while walking, may seek 0 No support from furniture) Impaired (short steps with shuffle, may have difficulty arising from chair, head down, impaired 0 No balance) Mental Status Oriented to own ability 0 Yes Electronic Signature(s) Signed: 01/03/2022 4:54:57 PM By: Thayer Dallas Entered By: Thayer Dallas on 01/02/2022 11:16:57 -------------------------------------------------------------------------------- Foot Assessment Details Patient Name: Date of Service: [name]. 01/02/2022 10:30 A M Medical Record Number: [id number] Patient Account Number: [id number] Date of Birth/Sex: Treating RN: 1950/12/21 (71 y.o. F) Primary Care Makynlee Kressin: Marcelline Mates., RO BERT Other Clinician: Referring Leanette Eutsler: Treating Chele Cornell/Extender: Reatha Harps in Treatment: 0 Foot Assessment Items Site Locations + = Sensation present, - = Sensation absent, C = Callus, U = Ulcer Cardenas = Redness, W = Warmth, M = Maceration, PU = Pre-ulcerative lesion F = Fissure, S = Swelling, D = Dryness Assessment Right: Left: Other Deformity: No No Prior Foot Ulcer: No  No Prior Amputation: No No Charcot Joint: No No Ambulatory Status: Ambulatory Without Help GaitTYSA, [name] ([id number]) [phone].pdf Page 4 of 4 Electronic Signature(s) Signed: 01/03/2022 4:54:57 PM By: Thayer Dallas Entered By: Thayer Dallas on 01/02/2022 11:18:12 -------------------------------------------------------------------------------- Nutrition Risk Screening Details Patient Name: Date of Service: [name]. 01/02/2022 10:30 A M Medical Record Number: [id number] Patient Account Number: [id number] Date of Birth/Sex: Treating RN: [birth year] (71 y.o. F) Primary Care Marchele Decock: Marcelline Mates., RO BERT Other Clinician: Referring Baylon Santelli: Treating Rashi Granier/Extender: Reatha Harps in Treatment: 0 Height (in): 64 Weight (lbs): 97 Body Mass Index (BMI): 16.6 Nutrition Risk Screening Items Score Screening NUTRITION RISK SCREEN: I have an illness or condition that made me change the kind and/or amount of food I eat 0 No I eat fewer than two meals per day 0 No I eat few fruits and vegetables, or milk products 0 No I have three or more drinks of beer, liquor or wine almost every day 0 No I have tooth or mouth problems that make it hard for me to eat 0 No I don't always have enough money to buy the food I need 0 No I eat alone most of the time 0 No I take three or more different prescribed or over-the-counter drugs a day 1 Yes Without wanting to, I have lost or gained 10 pounds in the last six months 2 Yes I am not always physically able to shop, cook and/or feed myself 2 Yes Nutrition Protocols Good Risk Protocol Moderate Risk Protocol 0 Provide education on nutrition High Risk Proctocol Risk Level: Moderate Risk Score: 5 Electronic Signature(s) Signed: 01/03/2022 4:54:57 PM By: Thayer Dallas Entered By: Thayer Dallas on 01/02/2022 11:17:41

## 2022-01-03 NOTE — Progress Notes (Signed)
Marissa, Cardenas ([id number]) [phone].pdf Page 1 of 7 Visit Report for 01/02/2022 Allergy List Details Patient Name: Date of Service: [name]. 01/02/2022 10:30 A M Medical Record Number: [id number] Patient Account Number: [id number] Date of Birth/Sex: Treating RN: 31-Jan-1951 (71 y.o. F) Primary Care Qualyn Oyervides: Marcelline Mates., RO BERT Other Clinician: Referring Tanay Massiah: Treating Keimya Briddell/Extender: Reatha Harps in Treatment: 0 Allergies Active Allergies No Known Allergies Allergy Notes Electronic Signature(s) Signed: 01/03/2022 4:54:57 PM By: Thayer Dallas Entered By: Thayer Dallas on 01/02/2022 11:09:37 -------------------------------------------------------------------------------- Arrival Information Details Patient Name: Date of Service: Marissa Berthold R. 01/02/2022 10:30 A M Medical Record Number: [id number] Patient Account Number: [id number] Date of Birth/Sex: Treating RN: 1950/10/26 (71 y.o. F) Primary Care Julius Matus: Marcelline Mates., RO BERT Other Clinician: Referring Dessa Ledee: Treating Kemisha Bonnette/Extender: Reatha Harps in Treatment: 0 Visit Information Patient Arrived: Ambulatory Arrival Time: 11:07 Accompanied By: caregiver Transfer Assistance: None Patient Identification Verified: Yes Secondary Verification Process Completed: Yes Patient Requires Transmission-Based Precautions: No Patient Has Alerts: No Electronic Signature(s) Signed: 01/03/2022 4:54:57 PM By: Thayer Dallas Entered By: Thayer Dallas on 01/02/2022 11:07:54 -------------------------------------------------------------------------------- Clinic Level of Care Assessment Details Patient Name: Date of Service: [name] 01/02/2022 10:30 A M Medical Record Number: [id number] Patient Account Number: [id number] [name], Cardenas ([id number]) [id number] Page 2 of 7 Date of Birth/Sex:  Treating RN: 10-06-1950 (71 y.o. Arta Silence Primary Care Gustavo Meditz: Other Clinician: Marcelline Mates., RO BERT Referring Tamel Abel: Treating Alba Kriesel/Extender: Reatha Harps in Treatment: 0 Clinic Level of Care Assessment Items TOOL 1 Quantity Score X- 1 0 Use when EandM and Procedure is performed on INITIAL visit ASSESSMENTS - Nursing Assessment / Reassessment X- 1 20 General Physical Exam (combine w/ comprehensive assessment (listed just below) when performed on new pt. evals) X- 1 25 Comprehensive Assessment (HX, ROS, Risk Assessments, Wounds Hx, etc.) ASSESSMENTS - Wound and Skin Assessment / Reassessment X- 1 10 Dermatologic / Skin Assessment (not related to wound area) ASSESSMENTS - Ostomy and/or Continence Assessment and Care []  - 0 Incontinence Assessment and Management []  - 0 Ostomy Care Assessment and Management (repouching, etc.) PROCESS - Coordination of Care []  - 0 Simple Patient / Family Education for ongoing care X- 1 20 Complex (extensive) Patient / Family Education for ongoing care X- 1 10 Staff obtains Chiropractor, Records, T Results / Process Orders est X- 1 10 Staff telephones HHA, Nursing Homes / Clarify orders / etc []  - 0 Routine Transfer to another Facility (non-emergent condition) []  - 0 Routine Hospital Admission (non-emergent condition) X- 1 15 New Admissions / Manufacturing engineer / Ordering NPWT Apligraf, etc. , []  - 0 Emergency Hospital Admission (emergent condition) PROCESS - Special Needs []  - 0 Pediatric / Minor Patient Management []  - 0 Isolation Patient Management []  - 0 Hearing / Language / Visual special needs []  - 0 Assessment of Community assistance (transportation, D/C planning, etc.) []  - 0 Additional assistance / Altered mentation []  - 0 Support Surface(s) Assessment (bed, cushion, seat, etc.) INTERVENTIONS - Miscellaneous []  - 0 External ear exam []  - 0 Patient Transfer (multiple staff / Water quality scientist / Similar devices) []  - 0 Simple Staple / Suture removal (25 or less) []  - 0 Complex Staple / Suture removal (26 or more) []  - 0 Hypo/Hyperglycemic Management (do not check if billed separately) []  - 0 Ankle / Brachial Index (ABI) - do not check if billed separately  Has the patient been seen at the hospital within the last three years: Yes Total Score: 110 Level Of Care: New/Established - Level 3 Electronic Signature(s) Signed: 01/02/2022 6:57:47 PM By: Shawn Stall RN, BSN Entered By: Shawn Stall on 01/02/2022 18:55:13 [name] ([id number]) [id number] Page 3 of 7 -------------------------------------------------------------------------------- Encounter Discharge Information Details Patient Name: Date of Service: [name] 01/02/2022 10:30 A M Medical Record Number: [id number] Patient Account Number: [id number] Date of Birth/Sex: Treating RN: 1950-12-12 (71 y.o. [name], [name] Primary Care Khyla Mccumbers: Marcelline Mates., RO BERT Other Clinician: Referring Brylin Stanislawski: Treating Cassidy Tabet/Extender: Reatha Harps in Treatment: 0 Encounter Discharge Information Items Post Procedure Vitals Discharge Condition: Stable Temperature (F): 98.5 Ambulatory Status: Ambulatory Pulse (bpm): 67 Discharge Destination: Home Respiratory Rate (breaths/min): 20 Transportation: Private Auto Blood Pressure (mmHg): 122/79 Accompanied By: self Schedule Follow-up Appointment: Yes Clinical Summary of Care: Electronic Signature(s) Signed: 01/02/2022 6:57:47 PM By: Shawn Stall RN, BSN Entered By: Shawn Stall on 01/02/2022 12:07:40 -------------------------------------------------------------------------------- Lower Extremity Assessment Details Patient Name: Date of Service: CO Marissa Goody R. 01/02/2022 10:30 A M Medical Record Number: [id number] Patient Account Number: [id number] Date of Birth/Sex: Treating RN: [birth year] (71  y.o. F) Primary Care Nigel Ericsson: Marcelline Mates., RO BERT Other Clinician: Referring Nadim Malia: Treating Nizhoni Parlow/Extender: Reatha Harps in Treatment: 0 Electronic Signature(s) Signed: 01/03/2022 4:54:57 PM By: Thayer Dallas Entered By: Thayer Dallas on 01/02/2022 11:18:18 -------------------------------------------------------------------------------- Multi-Disciplinary Care Plan Details Patient Name: Date of Service: Marissa Berthold R. 01/02/2022 10:30 A M Medical Record Number: [id number] Patient Account Number: [id number] Date of Birth/Sex: Treating RN: June 27, 1950 (71 y.o. Arta Silence Primary Care Kassandra Meriweather: Marcelline Mates., RO BERT Other Clinician: Referring Rifka Ramey: Treating Della Homan/Extender: Reatha Harps in Treatment: 0 Active Inactive Orientation to the Wound Care Program Nursing Diagnoses: Knowledge deficit related to the wound healing center program Goals: Patient/caregiver will verbalize understanding of the Wound Healing Center Program Date Initiated: 01/02/2022 Target Resolution Date: 01/11/2022 Goal Status: Active NADALYNN, DAIGNEAULT ([id number]) [phone].pdf Page 4 of 7 Interventions: Provide education on orientation to the wound center Notes: Pain, Acute or Chronic Nursing Diagnoses: Pain, acute or chronic: actual or potential Potential alteration in comfort, pain Goals: Patient will verbalize adequate pain control and receive pain control interventions during procedures as needed Date Initiated: 01/02/2022 Target Resolution Date: 01/11/2022 Goal Status: Active Patient/caregiver will verbalize adequate pain control between visits Date Initiated: 01/02/2022 Target Resolution Date: 01/10/2022 Goal Status: Active Interventions: Encourage patient to take pain medications as prescribed Provide education on pain management Reposition patient for comfort Treatment Activities: Administer pain  control measures as ordered : 01/02/2022 Notes: Pressure Nursing Diagnoses: Knowledge deficit related to management of pressures ulcers Goals: Patient/caregiver will verbalize risk factors for pressure ulcer development Date Initiated: 01/02/2022 Target Resolution Date: 01/10/2022 Goal Status: Active Interventions: Assess: immobility, friction, shearing, incontinence upon admission and as needed Assess offloading mechanisms upon admission and as needed Provide education on pressure ulcers Treatment Activities: T ordered outside of clinic : 01/02/2022 est Notes: Electronic Signature(s) Signed: 01/02/2022 6:57:47 PM By: Shawn Stall RN, BSN Entered By: Shawn Stall on 01/02/2022 11:59:27 -------------------------------------------------------------------------------- Pain Assessment Details Patient Name: Date of Service: Marissa Berthold R. 01/02/2022 10:30 A M Medical Record Number: [id number] Patient Account Number: [id number] Date of Birth/Sex: Treating RN: [birth year] (71 y.o. Orville Govern Primary Care Benney Somm[place]: Marcelline Mates., RO BERT Other Clinician: Referring Carmalita Wakefield: Treating Zubair Lofton/Extender: Lenda Kelp  [place] Royalty Weeks in Treatment: 0 Active Problems Location of Pain Severity and Description of Pain Patient Has Paino Yes Site Locations Pain LocationPRINCETTA, MROTEK ([id number]) [phone].pdf Page 5 of 7 Pain Location: Pain in Ulcers Duration of the Pain. Constant / Intermittento Intermittent Rate the pain. Current Pain Level: 0 Worst Pain Level: 8 Least Pain Level: 0 Tolerable Pain Level: 0 Character of Pain Describe the Pain: Aching, Throbbing Pain Management and Medication Current Pain Management: Goals for Pain Management pt states starts to hurt when lying or sitting for too long, pain goes away when pressure is relieved Electronic Signature(s) Signed: 01/02/2022 5:59:56 PM By: Redmond Pulling RN, BSN Entered By:  Redmond Pulling on 01/02/2022 11:30:54 -------------------------------------------------------------------------------- Patient/Caregiver Education Details Patient Name: Date of Service: [name] 10/25/2023andnbsp10:30 A M Medical Record Number: [id number] Patient Account Number: [id number] Date of Birth/Gender: Treating RN: Sep 15, 1950 (71 y.o. [name], [name] Primary Care Physician: Marcelline Mates., RO BERT Other Clinician: Referring Physician: Treating Physician/Extender: Reatha Harps in Treatment: 0 Education Assessment Education Provided To: Patient Education Topics Provided Welcome T [facility]: o Handouts: Welcome T [facility] o Methods: Explain/Verbal Responses: Reinforcements needed Electronic Signature(s) Signed: 01/02/2022 6:57:47 PM By: Shawn Stall RN, BSN Entered By: Shawn Stall on 01/02/2022 11:59:36 [name] ([id number]) [id number] Page 6 of 7 -------------------------------------------------------------------------------- Wound Assessment Details Patient Name: Date of Service: [name] 01/02/2022 10:30 A M Medical Record Number: [id number] Patient Account Number: [id number] Date of Birth/Sex: Treating RN: Sep 14, 1950 (71 y.o. Orville Govern Primary Care Pieter Fooks: Marcelline Mates., RO BERT Other Clinician: Referring Yaphet Smethurst: Treating Jadin Creque/Extender: Reatha Harps in Treatment: 0 Wound Status Wound Number: 1 Primary Etiology: Pressure Ulcer Wound Location: Sacrum Wound Status: Open Wounding Event: Pressure Injury Comorbid History: Colitis Date Acquired: 10/09/2021 Weeks Of Treatment: 0 Clustered Wound: No Photos Wound Measurements Length: (cm) 0.9 Width: (cm) 0.5 Depth: (cm) 0.7 Area: (cm) 0.353 Volume: (cm) 0.247 % Reduction in Area: % Reduction in Volume: Epithelialization: None Tunneling: No Undermining: Yes Starting  Position (o'clock): 12 Ending Position (o'clock): 12 Maximum Distance: (cm) 1.5 Wound Description Classification: Category/Stage IV Wound Margin: Distinct, outline attached Exudate Amount: Medium Exudate Type: Serosanguineous Exudate Color: red, brown Foul Odor After Cleansing: No Slough/Fibrino Yes Wound Bed Granulation Amount: Small (1-33%) Exposed Structure Granulation Quality: Pink, Pale Fascia Exposed: No Necrotic Amount: Large (67-100%) Fat Layer (Subcutaneous Tissue) Exposed: Yes Necrotic Quality: Adherent Slough Tendon Exposed: No Muscle Exposed: Yes Necrosis of Muscle: Yes Joint Exposed: No Bone Exposed: No Periwound Skin Texture Texture Color No Abnormalities Noted: Yes No Abnormalities Noted: No Hemosiderin Staining: Yes Moisture No Abnormalities Noted: Yes Temperature / Pain Temperature: No Abnormality Treatment Notes Wound #1 (Sacrum) Cleanser Wound Cleanser Discharge Instruction: Cleanse the wound with wound cleanser prior to applying a clean dressing using gauze sponges, not tissue or cotton MALA, BONKOSKI R ([id number]) [id number] Page 7 of 7 balls. Peri-Wound Care Skin Prep Discharge Instruction: Use skin prep as directed Topical Primary Dressing Hydrofera Blue Ready Foam, 2.5 x2.5 in Discharge Instruction: Lightly pack into wound bed. Secondary Dressing Zetuvit Plus Silicone Border Dressing 4x4 (in/in) Discharge Instruction: Apply silicone border over primary dressing as directed. Secured With Compression Wrap Compression Stockings Facilities manager) Signed: 01/02/2022 5:59:56 PM By: Redmond Pulling RN, BSN Signed: 01/02/2022 6:57:47 PM By: Shawn Stall RN, BSN Entered By: Shawn Stall on 01/02/2022 12:01:38 -------------------------------------------------------------------------------- Vitals Details Patient Name: Date of  Service: [name] 01/02/2022 10:30 A M Medical Record Number:  [id number] Patient Account Number: [id number] Date of Birth/Sex: Treating RN: Jan 02, 1951 (71 y.o. F) Primary Care Anjenette Gerbino: Marcelline Mates., RO BERT Other Clinician: Referring Gittel Mccamish: Treating Pookela Sellin/Extender: Reatha Harps in Treatment: 0 Vital Signs Time Taken: 11:08 Temperature (F): 98.5 Height (in): 64 Pulse (bpm): 67 Source: Stated Respiratory Rate (breaths/min): 18 Weight (lbs): 97 Blood Pressure (mmHg): 122/79 Source: Stated Reference Range: 80 - 120 mg / dl Body Mass Index (BMI): 16.6 Electronic Signature(s) Signed: 01/03/2022 4:54:57 PM By: Thayer Dallas Entered By: Thayer Dallas on 01/02/2022 11:09:23

## 2022-01-04 DIAGNOSIS — F17210 Nicotine dependence, cigarettes, uncomplicated: Secondary | ICD-10-CM | POA: Diagnosis not present

## 2022-01-04 DIAGNOSIS — J449 Chronic obstructive pulmonary disease, unspecified: Secondary | ICD-10-CM | POA: Diagnosis not present

## 2022-01-08 ENCOUNTER — Other Ambulatory Visit (HOSPITAL_COMMUNITY): Payer: Self-pay | Admitting: Physician Assistant

## 2022-01-08 ENCOUNTER — Ambulatory Visit (HOSPITAL_COMMUNITY)
Admission: RE | Admit: 2022-01-08 | Discharge: 2022-01-08 | Disposition: A | Discharge status: Home / Self Care | Payer: Medicare Other | Source: Ambulatory Visit | Attending: Physician Assistant | Admitting: Physician Assistant

## 2022-01-08 DIAGNOSIS — L89154 Pressure ulcer of sacral region, stage 4: Secondary | ICD-10-CM | POA: Diagnosis not present

## 2022-01-08 DIAGNOSIS — J449 Chronic obstructive pulmonary disease, unspecified: Secondary | ICD-10-CM | POA: Diagnosis not present

## 2022-01-08 DIAGNOSIS — Z681 Body mass index (BMI) 19 or less, adult: Secondary | ICD-10-CM | POA: Diagnosis not present

## 2022-01-08 DIAGNOSIS — E46 Unspecified protein-calorie malnutrition: Secondary | ICD-10-CM | POA: Diagnosis not present

## 2022-01-08 DIAGNOSIS — I5032 Chronic diastolic (congestive) heart failure: Secondary | ICD-10-CM | POA: Diagnosis not present

## 2022-01-08 DIAGNOSIS — L89159 Pressure ulcer of sacral region, unspecified stage: Secondary | ICD-10-CM | POA: Diagnosis not present

## 2022-01-08 DIAGNOSIS — G8929 Other chronic pain: Secondary | ICD-10-CM | POA: Diagnosis not present

## 2022-01-08 NOTE — Progress Notes (Signed)
Marissa, Cardenas ([id number]) [phone].pdf Page 1 of 10 Visit Report for 01/02/2022 Chief Complaint Document Details Patient Name: Date of Service: [name]. 01/02/2022 10:30 A M Medical Record Number: [id number] Patient Account Number: [id number] Date of Birth/Sex: Treating RN: 1950/12/27 (71 y.o. F) Primary Care Provider: Frederik Pear., RO BERT Other Clinician: Referring Provider: Treating Provider/Extender: Arbutus Ped in Treatment: 0 Information Obtained from: Patient Chief Complaint Pressure ulcer stage 3 Sacrum Electronic Signature(s) Signed: 01/02/2022 11:57:56 AM By: Worthy Keeler PA-C Entered By: Worthy Keeler on 01/02/2022 11:57:56 -------------------------------------------------------------------------------- Debridement Details Patient Name: Date of Service: [name]. 01/02/2022 10:30 A M Medical Record Number: [id number] Patient Account Number: [id number] Date of Birth/Sex: Treating RN: 04-23-1950 (71 y.o. [name], Meta.Reding Primary Care Provider: Frederik Pear., RO BERT Other Clinician: Referring Provider: Treating Provider/Extender: Arbutus Ped in Treatment: 0 Debridement Performed for Assessment: Wound #1 Sacrum Performed By: Physician Worthy Keeler, PA Debridement Type: Debridement Level of Consciousness (Pre-procedure): Awake and Alert Pre-procedure Verification/Time Out Yes - 11:55 Taken: Start Time: 11:56 Pain Control: Lidocaine 5% topical ointment T Area Debrided (L x W): otal 0.9 (cm) x 0.5 (cm) = 0.45 (cm) Tissue and other material debrided: Viable, Non-Viable, Muscle, Slough, Subcutaneous, Skin: Dermis , Skin: Epidermis, Slough Level: Skin/Subcutaneous Tissue/Muscle Debridement Description: Excisional Instrument: Curette Bleeding: Minimum Hemostasis Achieved: Pressure End Time: 12:02 Procedural Pain: 0 Post Procedural Pain: 0 Response to Treatment:  Procedure was tolerated well Level of Consciousness (Post- Awake and Alert procedure): Post Debridement Measurements of Total Wound Length: (cm) 0.9 Stage: Category/Stage IV Width: (cm) 0.5 Depth: (cm) 0.7 Volume: (cm) 0.247 Character of Wound/Ulcer Post Debridement: Requires Further Debridement Post Procedure Diagnosis [name], [name] ([id number]) [id number] Page 2 of 10 Same as Pre-procedure Electronic Signature(s) Signed: 01/02/2022 6:42:42 PM By: Worthy Keeler PA-C Signed: 01/02/2022 6:57:47 PM By: Deon Pilling RN, BSN Entered By: Deon Pilling on 01/02/2022 12:02:50 -------------------------------------------------------------------------------- HPI Details Patient Name: Date of Service: [name]. 01/02/2022 10:30 A M Medical Record Number: [id number] Patient Account Number: [id number] Date of Birth/Sex: Treating RN: Oct 11, 1950 (71 y.o. F) Primary Care Provider: Frederik Pear., RO BERT Other Clinician: Referring Provider: Treating Provider/Extender: Arbutus Ped in Treatment: 0 History of Present Illness HPI Description: 01-02-22 upon evaluation today patient presents for initial inspection here in our clinic concerning a wound that she has over the sacral region. This is stated to be present since around the beginning of August 2023. She currently resides in a assisted nursing facility. She does seem to be able to answer questions fully today. Subsequently I do note that she is a little on the thin side but again other than the protein calorie malnutrition she is minimally weak but still does get up and move around some but she also tells me that she "sits a lot". She has not had any x-rays of the sacral region at this point. Electronic Signature(s) Signed: 01/02/2022 2:13:46 PM By: Worthy Keeler PA-C Previous Signature: 01/02/2022 2:13:31 PM Version By: Worthy Keeler PA-C Entered By: Worthy Keeler on  01/02/2022 14:13:46 -------------------------------------------------------------------------------- Physical Exam Details Patient Name: Date of Service: [name]. 01/02/2022 10:30 A M Medical Record Number: [id number] Patient Account Number: [id number] Date of Birth/Sex: Treating RN: February 28, 1951 (71 y.o. F) Primary Care Provider: Frederik Pear., RO BERT Other Clinician: Referring Provider: Treating Provider/Extender: Chester Holstein,  Buena Irish in Treatment: 0 Constitutional sitting or standing blood pressure is within target range for patient.. pulse regular and within target range for patient.Marland Kitchen respirations regular, non-labored and within target range for patient.Marland Kitchen temperature within target range for patient.. Thin and well-hydrated in no acute distress. Eyes conjunctiva clear no eyelid edema noted. pupils equal round and reactive to light and accommodation. Ears, Nose, Mouth, and Throat no gross abnormality of ear auricles or external auditory canals. normal hearing noted during conversation. mucus membranes moist. Respiratory normal breathing without difficulty. Musculoskeletal shuffling gait. Psychiatric this patient is able to make decisions and demonstrates good insight into disease process. Alert and Oriented x 3. pleasant and cooperative. Notes [name], [name] ([id number]) [id number] Page 3 of 10 Upon inspection patient's wound bed actually showed signs of some necrotic tissue on the surface of the wound based on what I am seeing today. I do believe that the patient is going to warrant sharp debridement today to clearway some of the necrotic debris. I think that is definitely going to be necessary based on what we are seeing currently. With that being said other than the fact that she does have some need for sharp debridement I do not necessarily see any evidence of infection actively at this time which is good news. Postdebridement  the wound bed appears to be doing better though I did remove some necrotic muscle as well as subcutaneous tissue and slough and biofilm down to good tissue. With that being said there is no direct bone exposure but this is definitely a quite deep wound. Electronic Signature(s) Signed: 01/02/2022 2:14:44 PM By: Worthy Keeler PA-C Entered By: Worthy Keeler on 01/02/2022 14:14:44 -------------------------------------------------------------------------------- Physician Orders Details Patient Name: Date of Service: [name]. 01/02/2022 10:30 A M Medical Record Number: [id number] Patient Account Number: [id number] Date of Birth/Sex: Treating RN: Feb 21, 1951 (71 y.o. [name], Meta.Reding Primary Care Provider: Frederik Pear., RO BERT Other Clinician: Referring Provider: Treating Provider/Extender: Arbutus Ped in Treatment: 0 Verbal / Phone Orders: No Diagnosis Coding ICD-10 Coding Code Description M62.81 Muscle weakness (generalized) E43 Unspecified severe protein-calorie malnutrition L89.154 Pressure ulcer of sacral region, stage 4 Follow-up Appointments ppointment in 1 week. - with Jeri Cos, PA on Wednesday. Return A Other: - x-ray to be performed and results sent to wound center. Anesthetic (In clinic) Topical Lidocaine 5% applied to wound bed Bathing/ Shower/ Hygiene May shower with protection but do not get wound dressing(s) wet. Off-Loading Turn and reposition every 2 hours [place] wound care orders this week; continue Home Health for wound care. May utilize formulary equivalent dressing for wound treatment orders unless otherwise specified. - home health to change Mondays and Wednesdays. Hydrofera blue lightly pack into wound bed. Wound Treatment Wound #1 - Sacrum Cleanser: Wound Cleanser (Home Health) 3 x Per Week/30 Days Discharge Instructions: Cleanse the wound with wound cleanser prior to applying a clean dressing using gauze  sponges, not tissue or cotton balls. Peri-Wound Care: Skin Prep (Home Health) 3 x Per Week/30 Days Discharge Instructions: Use skin prep as directed Prim Dressing: Hydrofera Blue Ready Foam, 2.5 x2.5 in (Home Health) 3 x Per Week/30 Days ary Discharge Instructions: Lightly pack into wound bed. Secondary Dressing: Zetuvit Plus Silicone Border Dressing 4x4 (in/in) (Home Health) 3 x Per Week/30 Days Discharge Instructions: Apply silicone border over primary dressing as directed. Radiology X-ray, coccyx - x-ray of coccyx related to non healing pressure ulcer looking for infection. CPT  code - (ICD10 L89.154 - Pressure ulcer of sacral region, stage 4) Patient Medications llergies: No Known Allergies A ALMEE, PELPHREY Cardenas ([id number]) [phone].pdf Page 4 of 10 Notifications Medication Indication Start End 01/02/2022 lidocaine DOSE topical 5 % gel - gel topical applied only in clinic for debridements. Electronic Signature(s) Signed: 01/02/2022 6:42:42 PM By: Worthy Keeler PA-C Signed: 01/02/2022 6:57:47 PM By: Deon Pilling RN, BSN Entered By: Deon Pilling on 01/02/2022 12:06:50 Prescription 01/02/2022 -------------------------------------------------------------------------------- Burnett Harry PA Patient Name: Provider: 1951/02/02 [id number] Date of Birth: NPI#Rickey Primus Sex: DEA #: [phone] Phone #: License #: [place] Patient Address: [facility] [address] [place], [place] [address] [place], [place] [address] [phone] Allergies No Known Allergies Provider's Orders X-ray, coccyx - ICD10: L89.154 - x-ray of coccyx related to non healing pressure ulcer looking for infection. CPT code Hand Signature: Date(s): Electronic Signature(s) Signed: 01/02/2022 6:42:42 PM By: Worthy Keeler PA-C Signed: 01/02/2022 6:57:47 PM By: Deon Pilling RN, BSN Entered By:  Deon Pilling on 01/02/2022 12:06:50 -------------------------------------------------------------------------------- Problem List Details Patient Name: Date of Service: [name]. 01/02/2022 10:30 A M Medical Record Number: [id number] Patient Account Number: [id number] Date of Birth/Sex: Treating RN: 22-Feb-1951 (71 y.o. F) Primary Care Provider: Frederik Pear., RO BERT Other Clinician: Referring Provider: Treating Provider/Extender: Arbutus Ped in Treatment: 0 Active Problems ICD-10 Encounter Code Description Active Date MDM Diagnosis M62.81 Muscle weakness (generalized) 01/02/2022 No Yes [name], [name] ([id number]) [id number] Page 5 of 10 E43 Unspecified severe protein-calorie malnutrition 01/02/2022 No Yes L89.154 Pressure ulcer of sacral region, stage 4 01/02/2022 No Yes Inactive Problems Resolved Problems Electronic Signature(s) Signed: 01/02/2022 6:42:42 PM By: Worthy Keeler PA-C Signed: 01/02/2022 6:57:47 PM By: Deon Pilling RN, BSN Previous Signature: 01/02/2022 11:57:31 AM Version By: Worthy Keeler PA-C Entered By: Deon Pilling on 01/02/2022 12:02:03 -------------------------------------------------------------------------------- Progress Note Details Patient Name: Date of Service: [name]. 01/02/2022 10:30 A M Medical Record Number: [id number] Patient Account Number: [id number] Date of Birth/Sex: Treating RN: 1950-08-27 (71 y.o. F) Primary Care Provider: Frederik Pear., RO BERT Other Clinician: Referring Provider: Treating Provider/Extender: Arbutus Ped in Treatment: 0 Subjective Chief Complaint Information obtained from Patient Pressure ulcer stage 3 Sacrum History of Present Illness (HPI) 01-02-22 upon evaluation today patient presents for initial inspection here in our clinic concerning a wound that she has over the sacral region. This is stated to be present  since around the beginning of August 2023. She currently resides in a assisted nursing facility. She does seem to be able to answer questions fully today. Subsequently I do note that she is a little on the thin side but again other than the protein calorie malnutrition she is minimally weak but still does get up and move around some but she also tells me that she "sits a lot". She has not had any x-rays of the sacral region at this point. Patient History Information obtained from Patient. Allergies No Known Allergies Family History Cancer - Father, Heart Disease - Father, Seizures - Siblings, No family history of Diabetes, Hereditary Spherocytosis, Hypertension, Kidney Disease, Lung Disease, Stroke, Thyroid Problems, Tuberculosis. Social History Current some day smoker, Marital Status - Widowed, Alcohol Use - Never, Drug Use - No History, Caffeine Use - Daily. Medical History Eyes Denies history of Cataracts, Glaucoma, Optic Neuritis Ear/Nose/Mouth/Throat Denies history of Chronic sinus  problems/congestion, Middle ear problems Hematologic/Lymphatic Denies history of Anemia, Hemophilia, Human Immunodeficiency Virus, Lymphedema, Sickle Cell Disease Respiratory Denies history of Aspiration, Asthma, Chronic Obstructive Pulmonary Disease (COPD), Pneumothorax, Sleep Apnea, Tuberculosis Cardiovascular Denies history of Angina, Arrhythmia, Congestive Heart Failure, Coronary Artery Disease, Deep Vein Thrombosis, Hypertension, Hypotension, Myocardial Infarction, Peripheral Arterial Disease, Peripheral Venous Disease, Phlebitis, Vasculitis Gastrointestinal Patient has history of Colitis Denies history of Cirrhosis , Crohnoos, Hepatitis A, Hepatitis B, Hepatitis C Genitourinary Denies history of End Stage Renal Disease Immunological Denies history of Lupus Erythematosus, Raynaudoos, Scleroderma [name], [name] ([id number]) [id number] Page 6 of 10 Integumentary  (Skin) Denies history of History of Burn Musculoskeletal Denies history of Gout, Rheumatoid Arthritis, Osteoarthritis, Osteomyelitis Neurologic Denies history of Dementia, Neuropathy, Quadriplegia, Paraplegia, Seizure Disorder Oncologic Denies history of Received Chemotherapy, Received Radiation Psychiatric Denies history of Anorexia/bulimia, Confinement Anxiety Hospitalization/Surgery History - hysterotomy. - cytis removed from breast. Review of Systems (ROS) Constitutional Symptoms (General Health) Denies complaints or symptoms of Fatigue, Fever, Chills, Marked Weight Change. Eyes Denies complaints or symptoms of Dry Eyes, Vision Changes, Glasses / Contacts. Ear/Nose/Mouth/Throat Denies complaints or symptoms of Chronic sinus problems or rhinitis. Respiratory Denies complaints or symptoms of Chronic or frequent coughs, Shortness of Breath. Cardiovascular Denies complaints or symptoms of Chest pain. Endocrine Denies complaints or symptoms of Heat/cold intolerance. Genitourinary Denies complaints or symptoms of Frequent urination. Integumentary (Skin) Complains or has symptoms of Wounds - sacrum. Musculoskeletal Denies complaints or symptoms of Muscle Pain, Muscle Weakness. Neurologic Denies complaints or symptoms of Numbness/parasthesias. Psychiatric Denies complaints or symptoms of Claustrophobia. Objective Constitutional sitting or standing blood pressure is within target range for patient.. pulse regular and within target range for patient.Marland Kitchen respirations regular, non-labored and within target range for patient.Marland Kitchen temperature within target range for patient.. Thin and well-hydrated in no acute distress. Vitals Time Taken: 11:08 AM, Height: 64 in, Source: Stated, Weight: 97 lbs, Source: Stated, BMI: 16.6, Temperature: 98.5 F, Pulse: 67 bpm, Respiratory Rate: 18 breaths/min, Blood Pressure: 122/79 mmHg. Eyes conjunctiva clear no eyelid edema noted. pupils equal round and  reactive to light and accommodation. Ears, Nose, Mouth, and Throat no gross abnormality of ear auricles or external auditory canals. normal hearing noted during conversation. mucus membranes moist. Respiratory normal breathing without difficulty. Musculoskeletal shuffling gait. Psychiatric this patient is able to make decisions and demonstrates good insight into disease process. Alert and Oriented x 3. pleasant and cooperative. General Notes: Upon inspection patient's wound bed actually showed signs of some necrotic tissue on the surface of the wound based on what I am seeing today. I do believe that the patient is going to warrant sharp debridement today to clearway some of the necrotic debris. I think that is definitely going to be necessary based on what we are seeing currently. With that being said other than the fact that she does have some need for sharp debridement I do not necessarily see any evidence of infection actively at this time which is good news. Postdebridement the wound bed appears to be doing better though I did remove some necrotic muscle as well as subcutaneous tissue and slough and biofilm down to good tissue. With that being said there is no direct bone exposure but this is definitely a quite deep wound. Integumentary (Hair, Skin) Wound #1 status is Open. Original cause of wound was Pressure Injury. The date acquired was: 10/09/2021. The wound is located on the Sacrum. The wound measures 0.9cm length x 0.5cm width x 0.7cm depth; 0.353cm^2 area and 0.247cm^3 volume.  There is muscle and Fat Layer (Subcutaneous Tissue) exposed. There is no tunneling noted, however, there is undermining starting at 12:00 and ending at 12:00 with a maximum distance of 1.5cm. There is a medium amount of serosanguineous drainage noted. The wound margin is distinct with the outline attached to the wound base. There is small (1-33%) pink, pale granulation within the wound bed. There is a large  (67-100%) amount of necrotic tissue within the wound bed including Adherent Slough and Necrosis of Muscle. The periwound skin appearance had no abnormalities noted for texture. The periwound skin appearance had no abnormalities noted for moisture. The periwound skin appearance exhibited: Hemosiderin Staining. Periwound temperature was noted as No Abnormality. [name], [name] ([id number]) [phone].pdf Page 7 of 10 Assessment Active Problems ICD-10 Muscle weakness (generalized) Unspecified severe protein-calorie malnutrition Pressure ulcer of sacral region, stage 4 Procedures Wound #1 Pre-procedure diagnosis of Wound #1 is a Pressure Ulcer located on the Sacrum . There was a Excisional Skin/Subcutaneous Tissue/Muscle Debridement with a total area of 0.45 sq cm performed by Worthy Keeler, PA. With the following instrument(s): Curette to remove Viable and Non-Viable tissue/material. Material removed includes Muscle, Subcutaneous Tissue, Slough, Skin: Dermis, and Skin: Epidermis after achieving pain control using Lidocaine 5% topical ointment. A time out was conducted at 11:55, prior to the start of the procedure. A Minimum amount of bleeding was controlled with Pressure. The procedure was tolerated well with a pain level of 0 throughout and a pain level of 0 following the procedure. Post Debridement Measurements: 0.9cm length x 0.5cm width x 0.7cm depth; 0.247cm^3 volume. Post debridement Stage noted as Category/Stage IV. Character of Wound/Ulcer Post Debridement requires further debridement. Post procedure Diagnosis Wound #1: Same as Pre-Procedure Plan Follow-up Appointments: Return Appointment in 1 week. - with Jeri Cos, PA on Wednesday. Other: - x-ray to be performed and results sent to wound center. Anesthetic: (In clinic) Topical Lidocaine 5% applied to wound bed Bathing/ Shower/ Hygiene: May shower with protection but do not get wound dressing(s)  wet. Off-Loading: Turn and reposition every 2 hours Home Health: New wound care orders this week; continue Home Health for wound care. May utilize formulary equivalent dressing for wound treatment orders unless otherwise specified. - home health to change Mondays and Wednesdays. Hydrofera blue lightly pack into wound bed. Radiology ordered were: X-ray, coccyx - x-ray of coccyx related to non healing pressure ulcer looking for infection. CPT code The following medication(s) was prescribed: lidocaine topical 5 % gel gel topical applied only in clinic for debridements. was prescribed at facility WOUND #1: - Sacrum Wound Laterality: Cleanser: Wound Cleanser ([facility]) 3 x Per Week/30 Days Discharge Instructions: Cleanse the wound with wound cleanser prior to applying a clean dressing using gauze sponges, not tissue or cotton balls. Peri-Wound Care: Skin Prep (Home Health) 3 x Per Week/30 Days Discharge Instructions: Use skin prep as directed Prim Dressing: Hydrofera Blue Ready Foam, 2.5 x2.5 in (Home Health) 3 x Per Week/30 Days ary Discharge Instructions: Lightly pack into wound bed. Secondary Dressing: Zetuvit Plus Silicone Border Dressing 4x4 (in/in) (Home Health) 3 x Per Week/30 Days Discharge Instructions: Apply silicone border over primary dressing as directed. 1. Based on what I am seeing currently I do believe that we need to definitely packed this with a dressing in order to help get this to fill-in she may even benefit from a wound VAC but there are few things that I want to check and work on before we get to that point.  The patient voiced understanding. 2. I am going to go ahead and initiate treatment with [facility] but I am also can recommend a Zetuvit bordered foam dressing to cover. 3. I am also going to send her for an x-ray of the sacral region to evaluate for any evidence of bone breakdown or osteomyelitis. Again she may end up wanting an MRI at some point but right now I  do not think manage up quite to that. 4. I did talk to her about offloading. I do think she could benefit potentially from an alternating air mattress that may be something that we do next week as well depending on how things progress and where things stand. She voiced understanding. We will see patient back for reevaluation in 1 week here in the clinic. If anything worsens or changes patient will contact our office for additional recommendations. Electronic Signature(s) Signed: 01/02/2022 2:16:00 PM By: Worthy Keeler PA-C Entered By: Worthy Keeler on 01/02/2022 14:16:00 [name] ([id number]) [id number] Page 8 of 10 -------------------------------------------------------------------------------- HxROS Details Patient Name: Date of Service: [name] 01/02/2022 10:30 A M Medical Record Number: [id number] Patient Account Number: [id number] Date of Birth/Sex: Treating RN: 1950-03-15 (71 y.o. F) Primary Care Provider: Frederik Pear., RO BERT Other Clinician: Referring Provider: Treating Provider/Extender: Arbutus Ped in Treatment: 0 Information Obtained From Patient Constitutional Symptoms (General Health) Complaints and Symptoms: Negative for: Fatigue; Fever; Chills; Marked Weight Change Eyes Complaints and Symptoms: Negative for: Dry Eyes; Vision Changes; Glasses / Contacts Medical History: Negative for: Cataracts; Glaucoma; Optic Neuritis Ear/Nose/Mouth/Throat Complaints and Symptoms: Negative for: Chronic sinus problems or rhinitis Medical History: Negative for: Chronic sinus problems/congestion; Middle ear problems Respiratory Complaints and Symptoms: Negative for: Chronic or frequent coughs; Shortness of Breath Medical History: Negative for: Aspiration; Asthma; Chronic Obstructive Pulmonary Disease (COPD); Pneumothorax; Sleep Apnea; Tuberculosis Cardiovascular Complaints and Symptoms: Negative for:  Chest pain Medical History: Negative for: Angina; Arrhythmia; Congestive Heart Failure; Coronary Artery Disease; Deep Vein Thrombosis; Hypertension; Hypotension; Myocardial Infarction; Peripheral Arterial Disease; Peripheral Venous Disease; Phlebitis; Vasculitis Endocrine Complaints and Symptoms: Negative for: Heat/cold intolerance Genitourinary Complaints and Symptoms: Negative for: Frequent urination Medical History: Negative for: End Stage Renal Disease Integumentary (Skin) Complaints and Symptoms: Positive for: Wounds - sacrum Medical History: Negative for: History of Burn Musculoskeletal Complaints and Symptoms: Negative for: Muscle Pain; Muscle Weakness Medical History: Negative for: Gout; Rheumatoid Arthritis; Osteoarthritis; Osteomyelitis Neurologic [name], [name] ([id number]) [id number] Page 9 of 10 Complaints and Symptoms: Negative for: Numbness/parasthesias Medical History: Negative for: Dementia; Neuropathy; Quadriplegia; Paraplegia; Seizure Disorder Psychiatric Complaints and Symptoms: Negative for: Claustrophobia Medical History: Negative for: Anorexia/bulimia; Confinement Anxiety Hematologic/Lymphatic Medical History: Negative for: Anemia; Hemophilia; Human Immunodeficiency Virus; Lymphedema; Sickle Cell Disease Gastrointestinal Medical History: Positive for: Colitis Negative for: Cirrhosis ; Crohns; Hepatitis A; Hepatitis B; Hepatitis C Immunological Medical History: Negative for: Lupus Erythematosus; Raynauds; Scleroderma Oncologic Medical History: Negative for: Received Chemotherapy; Received Radiation Immunizations Pneumococcal Vaccine: Received Pneumococcal Vaccination: No Implantable Devices None Hospitalization / Surgery History Type of Hospitalization/Surgery hysterotomy cytis removed from breast Family and Social History Cancer: Yes - Father; Diabetes: No; Heart Disease: Yes - Father; Hereditary  Spherocytosis: No; Hypertension: No; Kidney Disease: No; Lung Disease: No; Seizures: Yes - Siblings; Stroke: No; Thyroid Problems: No; Tuberculosis: No; Current some day smoker; Marital Status - Widowed; Alcohol Use: Never; Drug Use: No History; Caffeine Use: Daily; Financial Concerns: No; Food, Clothing or Shelter Needs: No; Support System Lacking: No; Transportation  Concerns: No Electronic Signature(s) Signed: 01/02/2022 6:42:42 PM By: Worthy Keeler PA-C Signed: 01/03/2022 4:54:57 PM By: Erenest Blank Entered By: Erenest Blank on 01/02/2022 11:15:22 -------------------------------------------------------------------------------- SuperBill Details Patient Name: Date of Service: [place] [name] 01/02/2022 Medical Record Number: [id number] Patient Account Number: [id number] Date of Birth/Sex: Treating RN: 05/22/1950 (71 y.o. F) Primary Care Provider: Frederik Pear., RO BERT Other Clinician: Referring Provider: Treating Provider/Extender: Arbutus Ped in Treatment: [address] [name], [name] ([id number]) [id number] Page 10 of 10 Diagnosis Coding ICD-10 Codes Code Description M62.81 Muscle weakness (generalized) E43 Unspecified severe protein-calorie malnutrition L89.154 Pressure ulcer of sacral region, stage 4 Facility Procedures : CPT4 Code: 17915056 Description: 97948 - WOUND CARE VISIT-LEV 3 EST PT Modifier: Quantity: 1 : CPT4 Code: 01655374 Description: 82707 - DEB MUSC/FASCIA 20 SQ CM/< ICD-10 Diagnosis Description L89.154 Pressure ulcer of sacral region, stage 4 Modifier: Quantity: 1 Physician Procedures : CPT4 Code Description Modifier [id number] 99204 - WC PHYS LEVEL 4 - NEW PT 25 ICD-10 Diagnosis Description M62.81 Muscle weakness (generalized) E43 Unspecified severe protein-calorie malnutrition L89.154 Pressure ulcer of sacral region, stage 4 Quantity: 1 : 2010071 21975 - WC PHYS DEBR MUSCLE/FASCIA 20 SQ CM ICD-10 Diagnosis  Description L89.154 Pressure ulcer of sacral region, stage 4 Quantity: 1 Electronic Signature(s) Signed: 01/02/2022 6:57:47 PM By: Deon Pilling RN, BSN Signed: 01/08/2022 12:44:21 PM By: Worthy Keeler PA-C Previous Signature: 01/02/2022 2:20:00 PM Version By: Worthy Keeler PA-C Entered By: Deon Pilling on 01/02/2022 18:55:19

## 2022-01-09 ENCOUNTER — Encounter (HOSPITAL_BASED_OUTPATIENT_CLINIC_OR_DEPARTMENT_OTHER): Payer: Medicare Other | Attending: Physician Assistant | Admitting: Physician Assistant

## 2022-01-09 DIAGNOSIS — M6281 Muscle weakness (generalized): Secondary | ICD-10-CM | POA: Insufficient documentation

## 2022-01-09 DIAGNOSIS — Z681 Body mass index (BMI) 19 or less, adult: Secondary | ICD-10-CM | POA: Insufficient documentation

## 2022-01-09 DIAGNOSIS — L89154 Pressure ulcer of sacral region, stage 4: Secondary | ICD-10-CM | POA: Insufficient documentation

## 2022-01-09 DIAGNOSIS — E43 Unspecified severe protein-calorie malnutrition: Secondary | ICD-10-CM | POA: Diagnosis not present

## 2022-01-09 NOTE — Progress Notes (Addendum)
Cardenas, Marissa ([id number]) [id number] Page 1 of 7 Visit Report for 01/09/2022 Chief Complaint Document Details Patient Name: Date of Service: [name]. 01/09/2022 11:00 A M Medical Record Number: [id number] Patient Account Number: [id number] Date of Birth/Sex: Treating RN: 10/07/1950 (71 y.o. F) Primary Care Provider: Frederik Pear., RO BERT Other Clinician: Referring Provider: Treating Provider/Extender: Gareth Morgan., RO BERT Weeks in Treatment: 1 Information Obtained from: Patient Chief Complaint Pressure ulcer stage 3 Sacrum Electronic Signature(s) Signed: 01/09/2022 11:49:37 AM By: Worthy Keeler PA-C Entered By: Worthy Keeler on 01/09/2022 11:49:36 -------------------------------------------------------------------------------- Debridement Details Patient Name: Date of Service: Marissa Marissa Compton Cardenas. 01/09/2022 11:00 A M Medical Record Number: [id number] Patient Account Number: [id number] Date of Birth/Sex: Treating RN: 07-03-1950 (71 y.o. [name], [name] Primary Care Provider: Frederik Pear., RO BERT Other Clinician: Referring Provider: Treating Provider/Extender: Gareth Morgan., RO BERT Weeks in Treatment: 1 Debridement Performed for Assessment: Wound #1 Sacrum Performed By: Physician Worthy Keeler, PA Debridement Type: Debridement Level of Consciousness (Pre-procedure): Awake and Alert Pre-procedure Verification/Time Out Yes - 11:58 Taken: Start Time: 11:58 Pain Control: Lidocaine T Area Debrided (L x W): otal 0.8 (cm) x 0.4 (cm) = 0.32 (cm) Tissue and other material debrided: Viable, Non-Viable, Slough, Subcutaneous, Slough Level: Skin/Subcutaneous Tissue Debridement Description: Excisional Instrument: Curette Bleeding: Minimum Hemostasis Achieved: Pressure End Time: 11:58 Procedural Pain: 0 Post Procedural Pain: 0 Response to Treatment: Procedure was tolerated well Level of Consciousness  (Post- Awake and Alert procedure): Post Debridement Measurements of Total Wound Length: (cm) 0.8 Stage: Category/Stage IV Width: (cm) 0.4 Depth: (cm) 0.9 Volume: (cm) 0.226 Character of Wound/Ulcer Post Debridement: Improved Post Procedure Diagnosis [name], [name] ([id number]) [id number] Page 2 of 7 Same as Pre-procedure Electronic Signature(s) Signed: 01/09/2022 6:14:51 PM By: Worthy Keeler PA-C Signed: 01/15/2022 3:49:22 PM By: [name] Entered By: [name] on 01/09/2022 11:59:22 -------------------------------------------------------------------------------- HPI Details Patient Name: Date of Service: Marissa Perking Cardenas. 01/09/2022 11:00 A M Medical Record Number: [id number] Patient Account Number: [id number] Date of Birth/Sex: Treating RN: 1950/03/14 (71 y.o. F) Primary Care Provider: Frederik Pear., RO BERT Other Clinician: Referring Provider: Treating Provider/Extender: Gareth Morgan., RO BERT Weeks in Treatment: 1 History of Present Illness HPI Description: 01-02-22 upon evaluation today patient presents for initial inspection here in our clinic concerning a wound that she has over the sacral region. This is stated to be present since around the beginning of August 2023. She currently resides in a assisted nursing facility. She does seem to be able to answer questions fully today. Subsequently I do note that she is a little on the thin side but again other than the protein calorie malnutrition she is minimally weak but still does get up and move around some but she also tells me that she "sits a lot". She has not had any x-rays of the sacral region at this point. 01-09-2022 upon evaluation today patient's wound in the sacral area actually appears to be doing decently well. Fortunately I do not see any signs of infection which is great news and overall I am extremely pleased with where things stand today. Electronic  Signature(s) Signed: 01/09/2022 12:57:28 PM By: Worthy Keeler PA-C Entered By: Worthy Keeler on 01/09/2022 12:57:28 -------------------------------------------------------------------------------- Physical Exam Details Patient Name: Date of Service: Marissa Marissa Compton Cardenas. 01/09/2022 11:00 A M Medical Record Number: [id number] Patient Account Number: [id number]  Date of Birth/Sex: Treating RN: 1951/01/05 (71 y.o. F) Primary Care Provider: Frederik Pear., RO BERT Other Clinician: Referring Provider: Treating Provider/Extender: Gareth Morgan., RO BERT Weeks in Treatment: 1 Constitutional Well-nourished and well-hydrated in no acute distress. Respiratory normal breathing without difficulty. Psychiatric this patient is able to make decisions and demonstrates good insight into disease process. Alert and Oriented x 3. pleasant and cooperative. Notes Patient's wound did require sharp debridement clearway some of the necrotic debris she actually tolerated the dressing changes without complication and overall I really do feel like that the patient is making excellent progress here. Still this is going to be a bit of a long road in order to get this completely healed and I discussed that with the patient today she is aware. She actually is in an assisted living facility and we have also discussed this with him as well at this point they are requesting that she be transferred to the wound care center closer. Again the only one I know of is In [facility] unless they are going to go to [place]. Either way that is up to them absolutely. Electronic Signature(s) Signed: 01/09/2022 12:58:46 PM By: Irean Hong [place], Manning Cardenas ([id number]) [id number] Page 3 of 7 Entered By: Worthy Keeler on 01/09/2022 12:58:46 -------------------------------------------------------------------------------- Physician Orders Details Patient Name:  Date of Service: [name]. 01/09/2022 11:00 A M Medical Record Number: [id number] Patient Account Number: [id number] Date of Birth/Sex: Treating RN: May 18, 1950 (71 y.o. [name], [name] Primary Care Provider: Frederik Pear., RO BERT Other Clinician: Referring Provider: Treating Provider/Extender: Gareth Morgan., RO BERT Weeks in Treatment: 1 Verbal / Phone Orders: No Diagnosis Coding ICD-10 Coding Code Description M62.81 Muscle weakness (generalized) E43 Unspecified severe protein-calorie malnutrition L89.154 Pressure ulcer of sacral region, stage 4 Follow-up Appointments ppointment in 1 week. Burman Blacksmith, [place] WEdnesday's Return A Nurse Visit: - Monday's and FRiday's Other: - x-ray to be performed and results sent to wound center. Anesthetic (In clinic) Topical Lidocaine 5% applied to wound bed Bathing/ Shower/ Hygiene May shower with protection but do not get wound dressing(s) wet. Off-Loading Turn and reposition every 2 hours Home Health No change in wound care orders this week; continue Home Health for wound care. May utilize formulary equivalent dressing for wound treatment orders unless otherwise specified. - home health to change Mondays and Wednesdays. Hydrofera blue lightly pack into wound bed. Wound Treatment Wound #1 - Sacrum Cleanser: Wound Cleanser (Home Health) 3 x Per Week/30 Days Discharge Instructions: Cleanse the wound with wound cleanser prior to applying a clean dressing using gauze sponges, not tissue or cotton balls. Peri-Wound Care: Skin Prep (Home Health) 3 x Per Week/30 Days Discharge Instructions: Use skin prep as directed Prim Dressing: Hydrofera Blue Ready Foam, 2.5 x2.5 in (Home Health) 3 x Per Week/30 Days ary Discharge Instructions: Lightly pack into wound bed. Secondary Dressing: Zetuvit Plus Silicone Border Dressing 4x4 (in/in) (Home Health) 3 x Per Week/30 Days Discharge Instructions: Apply silicone border over primary  dressing as directed. [employer] New patient Referral - [facility] wound care for sacral wound Electronic Signature(s) Signed: 01/09/2022 6:14:51 PM By: Worthy Keeler PA-C Signed: 01/15/2022 3:49:22 PM By: [name] Entered By: [name] on 01/09/2022 12:26:44 [name] ([id number]) [id number] Page 4 of 7 Prescription 01/09/2022 -------------------------------------------------------------------------------- Burnett Harry PA Patient Name: Provider: 1951-02-06 [id number] Date of Birth:  NPI#Wanda Plump [id number] Sex: DEA #: [phone] Phone #: License #: [place] Patient Address: [place] [address] [place], [place] [address] [place], [place] [address] [phone] Allergies No Known Allergies Provider's Orders New patient Referral - [facility] wound care for sacral wound Hand Signature: Date(s): Electronic Signature(s) Signed: 01/09/2022 6:14:51 PM By: Worthy Keeler PA-C Signed: 01/15/2022 3:49:22 PM By: [name] Entered By: [name] on 01/09/2022 12:26:44 -------------------------------------------------------------------------------- Problem List Details Patient Name: Date of Service: Marissa Marissa Compton Cardenas. 01/09/2022 11:00 A M Medical Record Number: [id number] Patient Account Number: [id number] Date of Birth/Sex: Treating RN: 17-Aug-1950 (71 y.o. F) Primary Care Provider: Frederik Pear., RO BERT Other Clinician: Referring Provider: Treating Provider/Extender: Gareth Morgan., RO BERT Weeks in Treatment: 1 Active Problems ICD-10 Encounter Code Description Active Date MDM Diagnosis M62.81 Muscle weakness (generalized) 01/02/2022 No Yes E43 Unspecified severe protein-calorie malnutrition 01/02/2022 No Yes L89.154 Pressure ulcer of sacral region, stage 4 01/02/2022 No Yes Inactive  Problems Resolved Problems [name], [name] ([id number]) [id number] Page 5 of 7 Electronic Signature(s) Signed: 01/09/2022 11:49:31 AM By: Worthy Keeler PA-C Entered By: Worthy Keeler on 01/09/2022 11:49:31 -------------------------------------------------------------------------------- Progress Note Details Patient Name: Date of Service: Marissa Marissa Compton Cardenas. 01/09/2022 11:00 A M Medical Record Number: [id number] Patient Account Number: [id number] Date of Birth/Sex: Treating RN: 1950/11/27 (71 y.o. F) Primary Care Provider: Frederik Pear., RO BERT Other Clinician: Referring Provider: Treating Provider/Extender: Gareth Morgan., RO BERT Weeks in Treatment: 1 Subjective Chief Complaint Information obtained from Patient Pressure ulcer stage 3 Sacrum History of Present Illness (HPI) 01-02-22 upon evaluation today patient presents for initial inspection here in our clinic concerning a wound that she has over the sacral region. This is stated to be present since around the beginning of August 2023. She currently resides in a assisted nursing facility. She does seem to be able to answer questions fully today. Subsequently I do note that she is a little on the thin side but again other than the protein calorie malnutrition she is minimally weak but still does get up and move around some but she also tells me that she "sits a lot". She has not had any x-rays of the sacral region at this point. 01-09-2022 upon evaluation today patient's wound in the sacral area actually appears to be doing decently well. Fortunately I do not see any signs of infection which is great news and overall I am extremely pleased with where things stand today. Objective Constitutional Well-nourished and well-hydrated in no acute distress. Vitals Time Taken: 11:35 AM, Height: 64 in, Weight: 97 lbs, BMI: 16.6, Temperature: 98.7 F, Pulse: 82 bpm, Respiratory Rate: 20 breaths/min,  Blood Pressure: 155/77 mmHg. Respiratory normal breathing without difficulty. Psychiatric this patient is able to make decisions and demonstrates good insight into disease process. Alert and Oriented x 3. pleasant and cooperative. General Notes: Patient's wound did require sharp debridement clearway some of the necrotic debris she actually tolerated the dressing changes without complication and overall I really do feel like that the patient is making excellent progress here. Still this is going to be a bit of a long road in order to get this completely healed and I discussed that with the patient today she is aware. She actually is in an assisted living facility and we have also discussed this with him as well at this point they  are requesting that she be transferred to the wound care center closer. Again the only one I know of is In [facility] unless they are going to go to [place]. Either way that is up to them absolutely. Integumentary (Hair, Skin) Wound #1 status is Open. Original cause of wound was Pressure Injury. The date acquired was: 10/09/2021. The wound has been in treatment 1 weeks. The wound is located on the Sacrum. The wound measures 0.8cm length x 0.4cm width x 0.9cm depth; 0.251cm^2 area and 0.226cm^3 volume. There is muscle and Fat Layer (Subcutaneous Tissue) exposed. There is no tunneling or undermining noted. There is a medium amount of serosanguineous drainage noted. The wound margin is distinct with the outline attached to the wound base. There is small (1-33%) pink, pale granulation within the wound bed. There is a large (67- 100%) amount of necrotic tissue within the wound bed including Adherent Slough and Necrosis of Muscle. The periwound skin appearance had no abnormalities noted for texture. The periwound skin appearance had no abnormalities noted for moisture. The periwound skin appearance had no abnormalities noted for color. Periwound  temperature was noted as No Abnormality. Assessment Active Problems ICD-10 [name], [name] ([id number]) [id number] Page 6 of 7 Muscle weakness (generalized) Unspecified severe protein-calorie malnutrition Pressure ulcer of sacral region, stage 4 Procedures Wound #1 Pre-procedure diagnosis of Wound #1 is a Pressure Ulcer located on the Sacrum . There was a Excisional Skin/Subcutaneous Tissue Debridement with a total area of 0.32 sq cm performed by Worthy Keeler, PA. With the following instrument(s): Curette to remove Viable and Non-Viable tissue/material. Material removed includes Subcutaneous Tissue and Slough and after achieving pain control using Lidocaine. No specimens were taken. A time out was conducted at 11:58, prior to the start of the procedure. A Minimum amount of bleeding was controlled with Pressure. The procedure was tolerated well with a pain level of 0 throughout and a pain level of 0 following the procedure. Post Debridement Measurements: 0.8cm length x 0.4cm width x 0.9cm depth; 0.226cm^3 volume. Post debridement Stage noted as Category/Stage IV. Character of Wound/Ulcer Post Debridement is improved. Post procedure Diagnosis Wound #1: Same as Pre-Procedure Plan Follow-up Appointments: Return Appointment in 1 week. Burman Blacksmith, [place] WEdnesday's Nurse Visit: - Monday's and FRiday's Other: - x-ray to be performed and results sent to wound center. Anesthetic: (In clinic) Topical Lidocaine 5% applied to wound bed Bathing/ Shower/ Hygiene: May shower with protection but do not get wound dressing(s) wet. Off-Loading: Turn and reposition every 2 hours Home Health: No change in wound care orders this week; continue Home Health for wound care. May utilize formulary equivalent dressing for wound treatment orders unless otherwise specified. - home health to change Mondays and Wednesdays. Hydrofera blue lightly pack into wound bed. ordered were: New  patient Referral - [facility] wound care for sacral wound WOUND #1: - Sacrum Wound Laterality: Cleanser: Wound Cleanser ([place]) 3 x Per Week/30 Days Discharge Instructions: Cleanse the wound with wound cleanser prior to applying a clean dressing using gauze sponges, not tissue or cotton balls. Peri-Wound Care: Skin Prep (Home Health) 3 x Per Week/30 Days Discharge Instructions: Use skin prep as directed Prim Dressing: Hydrofera Blue Ready Foam, 2.5 x2.5 in (Home Health) 3 x Per Week/30 Days ary Discharge Instructions: Lightly pack into wound bed. Secondary Dressing: Zetuvit Plus Silicone Border Dressing 4x4 (in/in) (Home Health) 3 x Per Week/30 Days Discharge Instructions: Apply silicone border over primary dressing as directed.  1. I am good recommend that we have the patient going continue to monitor for any signs of infection or worsening this actually be done by whoever is changing the dressings right now that's due to the fact that the patient is not currently being seen by home health despite the fact she needs to be there was no contract with the assisted living facility apparently is a very small facility just 6 patients. Nonetheless this means that they either need to have a registered nurse change the dressing and knows how rails the patient has to come here. This is why it prompted them to see about getting something closer to where they are as it is quite a drive from [place] to here. 2. I do believe that sticking with the Hydrofera Blue lightly packed into the wound bed is going to be the way to go. 3. In regard to the x-ray not seeing the results of the x-ray yet although this is definitely something that we need to see is still waiting to be read as soon as it is read we will get that results to them as well. We will see patient back for reevaluation in 1 week here in the clinic. If anything worsens or changes patient will contact our office for  additional recommendations. Electronic Signature(s) Signed: 01/09/2022 12:59:55 PM By: Worthy Keeler PA-C Entered By: Worthy Keeler on 01/09/2022 12:59:55 -------------------------------------------------------------------------------- SuperBill Details Patient Name: Date of Service: [name], [name] 01/09/2022 Medical Record Number: [id number] Patient Account Number: [id number] [name], [name] ([id number]) [id number] Page 7 of 7 Date of Birth/Sex: Treating RN: [birth year] (71 y.o. [name], [name] Primary Care Provider: Frederik Pear., RO BERT Other Clinician: Referring Provider: Treating Provider/Extender: Gareth Morgan., RO BERT Weeks in Treatment: 1 Diagnosis Coding ICD-10 Codes Code Description M62.81 Muscle weakness (generalized) E43 Unspecified severe protein-calorie malnutrition L89.154 Pressure ulcer of sacral region, stage 4 Facility Procedures : CPT4 Code: [id number] Description: 12751 - DEB SUBQ TISSUE 20 SQ CM/< ICD-10 Diagnosis Description L89.154 Pressure ulcer of sacral region, stage 4 Modifier: Quantity: 1 Physician Procedures : CPT4 Code Description Modifier [id number] 44967 - WC PHYS SUBQ TISS 20 SQ CM ICD-10 Diagnosis Description L89.154 Pressure ulcer of sacral region, stage 4 Quantity: 1 Electronic Signature(s) Signed: 01/09/2022 1:00:07 PM By: Worthy Keeler PA-C Entered By: Worthy Keeler on 01/09/2022 13:00:06

## 2022-01-15 NOTE — Progress Notes (Signed)
directed Topical Primary Dressing Hydrofera Blue Ready Foam, 2.5 x2.5 in Discharge Instruction: Lightly pack into wound bed. Secondary Dressing Zetuvit Plus Silicone Border Dressing 4x4 (in/in) Discharge Instruction: Apply silicone border over primary dressing as directed. Secured With Compression Wrap Compression Stockings Add-Ons Electronic Signature(s) Signed: 01/10/2022 11:40:36 AM By: Erenest Blank Entered By: Erenest Blank on 01/09/2022 11:45:44 -------------------------------------------------------------------------------- Vitals Details Patient Name: Date of Service: [name]. 01/09/2022 11:00 A M Medical Record Number: [id number] Patient Account Number: [id number] [name], [name] ([id number]) [phone].pdf Page 6 of 6 Date of Birth/Sex: Treating RN: [birth year] (71 y.o. F) Primary Care Yasuo Phimmasone: Other Clinician: Frederik Pear., RO BERT Referring Kofi Murrell: Treating Caran Storck/Extender: Gareth Morgan., RO BERT Weeks in Treatment: 1 Vital Signs Time Taken: 11:35 Temperature (F): 98.7 Height (in): 64 Pulse (bpm): 82 Weight (lbs): 97 Respiratory Rate (breaths/min): 20 Body Mass Index (BMI): 16.6 Blood Pressure (mmHg): 155/77 Reference Range: 80 - 120 mg / dl Electronic Signature(s) Signed: 01/09/2022 1:12:45 PM By: Worthy Rancher Entered By: Worthy Rancher on 01/09/2022 11:37:13  to take pain medications as prescribed Provide education on pain management Reposition patient for comfort Treatment Activities: Administer pain control measures as ordered : 01/02/2022 [name], [name] ([id number]) [phone].pdf Page 3 of 6 Notes: Pressure Nursing Diagnoses: Knowledge deficit related to management of pressures ulcers Goals: Patient/caregiver will verbalize risk factors for pressure ulcer development Date Initiated: 01/02/2022 Target Resolution Date: 01/10/2022 Goal Status: Active Interventions: Assess: immobility, friction, shearing, incontinence upon admission and as needed Assess offloading mechanisms upon admission and as needed Provide education on pressure ulcers Treatment Activities: T ordered outside of clinic : 01/02/2022 est Notes: Electronic Signature(s) Signed: 01/15/2022 3:49:22 PM By: [name] Entered By: [name] on 01/09/2022 11:57:33 -------------------------------------------------------------------------------- Pain Assessment Details Patient Name: Date of Service: [name]. 01/09/2022 11:00 A M Medical Record Number: [id number] Patient Account Number: [id number] Date of Birth/Sex: Treating  RN: 04-16-1950 (71 y.o. F) Primary Care Leba Tibbitts: Frederik Pear., RO BERT Other Clinician: Referring Shea Kapur: Treating Myriam Brandhorst/Extender: Gareth Morgan., RO BERT Weeks in Treatment: 1 Active Problems Location of Pain Severity and Description of Pain Patient Has Paino No Site Locations Pain Management and Medication Current Pain Management: Electronic Signature(s) Signed: 01/09/2022 1:12:45 PM By: Worthy Rancher Entered By: Worthy Rancher on 01/09/2022 11:37:20 [name] ([id number]) [id number] Page 4 of 6 -------------------------------------------------------------------------------- Patient/Caregiver Education Details Patient Name: Date of Service: [name] 11/1/2023andnbsp11:00 A M Medical Record Number: [id number] Patient Account Number: [id number] Date of Birth/Gender: Treating RN: Jul 28, 1950 (71 y.o. [name], [name] Primary Care Physician: Frederik Pear., RO BERT Other Clinician: Referring Physician: Treating Physician/Extender: Gareth Morgan., RO BERT Weeks in Treatment: 1 Education Assessment Education Provided To: Patient Education Topics Provided Pain: Methods: Explain/Verbal Responses: Reinforcements needed, State content correctly Electronic Signature(s) Signed: 01/15/2022 3:49:22 PM By: [name] Entered By: [name] on 01/09/2022 11:57:45 -------------------------------------------------------------------------------- Wound Assessment Details Patient Name: Date of Service: [name]. 01/09/2022 11:00 A M Medical Record Number: [id number] Patient Account Number: [id number] Date of Birth/Sex: Treating RN: 12-Apr-1950 (71 y.o. F) Primary Care Jeiry Birnbaum: Frederik Pear., RO BERT Other Clinician: Referring Myana Schlup: Treating Loany Neuroth/Extender: Gareth Morgan., RO BERT Weeks in Treatment: 1 Wound Status Wound Number: 1 Primary Etiology: Pressure Ulcer Wound  Location: Sacrum Wound Status: Open Wounding Event: Pressure Injury Comorbid History: Colitis Date Acquired: 10/09/2021 Weeks Of Treatment: 1 Clustered Wound: No Photos Wound Measurements Length: (cm) 0.8 Linquist, [name] ([id number]) Width: (cm) 0 Depth: (cm) 0 Area: (cm) Volume: (cm) % Reduction in Area: 28.9% [id number] Page 5 of 6 .4 % Reduction in Volume: 8.5% .9 Epithelialization: None 0.251 Tunneling: No 0.226 Undermining: No Wound Description Classification: Category/Stage IV Wound Margin: Distinct, outline attached Exudate Amount: Medium Exudate Type: Serosanguineous Exudate Color: red, brown Foul Odor After Cleansing: No Slough/Fibrino Yes Wound Bed Granulation Amount: Small (1-33%) Exposed Structure Granulation Quality: Pink, Pale Fascia Exposed: No Necrotic Amount: Large (67-100%) Fat Layer (Subcutaneous Tissue) Exposed: Yes Necrotic Quality: Adherent Slough Tendon Exposed: No Muscle Exposed: Yes Necrosis of Muscle: Yes Joint Exposed: No Bone Exposed: No Periwound Skin Texture Texture Color No Abnormalities Noted: Yes No Abnormalities Noted: Yes Moisture Temperature / Pain No Abnormalities Noted: Yes Temperature: No Abnormality Treatment Notes Wound #1 (Sacrum) Cleanser Wound Cleanser Discharge Instruction: Cleanse the wound with wound cleanser prior to applying a clean dressing using gauze sponges, not tissue or cotton balls. Peri-Wound Care Skin Prep Discharge Instruction: Use skin prep as  directed Topical Primary Dressing Hydrofera Blue Ready Foam, 2.5 x2.5 in Discharge Instruction: Lightly pack into wound bed. Secondary Dressing Zetuvit Plus Silicone Border Dressing 4x4 (in/in) Discharge Instruction: Apply silicone border over primary dressing as directed. Secured With Compression Wrap Compression Stockings Add-Ons Electronic Signature(s) Signed: 01/10/2022 11:40:36 AM By: Erenest Blank Entered By: Erenest Blank on 01/09/2022 11:45:44 -------------------------------------------------------------------------------- Vitals Details Patient Name: Date of Service: [name]. 01/09/2022 11:00 A M Medical Record Number: [id number] Patient Account Number: [id number] [name], [name] ([id number]) [phone].pdf Page 6 of 6 Date of Birth/Sex: Treating RN: [birth year] (71 y.o. F) Primary Care Yasuo Phimmasone: Other Clinician: Frederik Pear., RO BERT Referring Kofi Murrell: Treating Caran Storck/Extender: Gareth Morgan., RO BERT Weeks in Treatment: 1 Vital Signs Time Taken: 11:35 Temperature (F): 98.7 Height (in): 64 Pulse (bpm): 82 Weight (lbs): 97 Respiratory Rate (breaths/min): 20 Body Mass Index (BMI): 16.6 Blood Pressure (mmHg): 155/77 Reference Range: 80 - 120 mg / dl Electronic Signature(s) Signed: 01/09/2022 1:12:45 PM By: Worthy Rancher Entered By: Worthy Rancher on 01/09/2022 11:37:13

## 2022-01-16 ENCOUNTER — Encounter (HOSPITAL_BASED_OUTPATIENT_CLINIC_OR_DEPARTMENT_OTHER): Payer: Medicare Other | Admitting: Internal Medicine

## 2022-01-16 DIAGNOSIS — M6281 Muscle weakness (generalized): Secondary | ICD-10-CM | POA: Diagnosis not present

## 2022-01-16 DIAGNOSIS — E43 Unspecified severe protein-calorie malnutrition: Secondary | ICD-10-CM | POA: Diagnosis not present

## 2022-01-16 DIAGNOSIS — Z681 Body mass index (BMI) 19 or less, adult: Secondary | ICD-10-CM | POA: Diagnosis not present

## 2022-01-16 DIAGNOSIS — L89154 Pressure ulcer of sacral region, stage 4: Secondary | ICD-10-CM | POA: Diagnosis not present

## 2022-01-16 NOTE — Progress Notes (Signed)
[name], [name] ([id number]) [id number] Page 1 of 9 Visit Report for 01/16/2022 Arrival Information Details Patient Name: Date of Service: [name]. 01/16/2022 11:30 A M Medical Record Number: [id number] Patient Account Number: [id number] Date of Birth/Sex: Treating RN: October 25, 1950 (71 y.o. [name] Primary Care Danaysha Kirn: Frederik Pear., RO BERT Other Clinician: Referring Niharika Savino: Treating Stephon Weathers/Extender: Gerald Leitz., RO BERT Weeks in Treatment: 2 Visit Information History Since Last Visit Added or deleted any medications: No Patient Arrived: Ambulatory Any new allergies or adverse reactions: No Arrival Time: 11:52 Had a fall or experienced change in No Accompanied By: self activities of daily living that may affect Transfer Assistance: None risk of falls: Patient Requires Transmission-Based Precautions: No Signs or symptoms of abuse/neglect since last visito No Patient Has Alerts: No Hospitalized since last visit: No Implantable device outside of the clinic excluding No cellular tissue based products placed in the center since last visit: Has Dressing in Place as Prescribed: Yes Pain Present Now: Yes Electronic Signature(s) Signed: 01/16/2022 3:10:50 PM By: Sharyn Creamer RN, BSN Entered By: Sharyn Creamer on 01/16/2022 11:52:50 -------------------------------------------------------------------------------- Clinic Level of Care Assessment Details Patient Name: Date of Service: [name]. 01/16/2022 11:30 A M Medical Record Number: [id number] Patient Account Number: [id number] Date of Birth/Sex: Treating RN: December 09, 1950 (71 y.o. [name], [name] Primary Care Tanayah Squitieri: Frederik Pear., RO BERT Other Clinician: Referring Lucretia Pendley: Treating Isaid Salvia/Extender: Gerald Leitz., RO BERT Weeks in Treatment: 2 Clinic Level of Care Assessment Items TOOL 4 Quantity Score X- 1 0 Use when only an EandM  is performed on FOLLOW-UP visit ASSESSMENTS - Nursing Assessment / Reassessment X- 1 10 Reassessment of Marissa-morbidities (includes updates in patient status) X- 1 5 Reassessment of Adherence to Treatment Plan ASSESSMENTS - Wound and Skin A ssessment / Reassessment X - Simple Wound Assessment / Reassessment - one wound 1 5 '[]'$  - 0 Complex Wound Assessment / Reassessment - multiple wounds '[]'$  - 0 Dermatologic / Skin Assessment (not related to wound area) ASSESSMENTS - Focused Assessment '[]'$  - 0 Circumferential Edema Measurements - multi extremities '[]'$  - 0 Nutritional Assessment / Counseling / Intervention [name], [name] ([id number]) [phone].pdf Page 2 of 9 '[]'$  - 0 Lower Extremity Assessment (monofilament, tuning fork, pulses) '[]'$  - 0 Peripheral Arterial Disease Assessment (using hand held doppler) ASSESSMENTS - Ostomy and/or Continence Assessment and Care '[]'$  - 0 Incontinence Assessment and Management '[]'$  - 0 Ostomy Care Assessment and Management (repouching, etc.) PROCESS - Coordination of Care X - Simple Patient / Family Education for ongoing care 1 15 '[]'$  - 0 Complex (extensive) Patient / Family Education for ongoing care X- 1 10 Staff obtains Programmer, systems, Records, T Results / Process Orders est X- 1 10 Staff telephones HHA, Nursing Homes / Clarify orders / etc '[]'$  - 0 Routine Transfer to another Facility (non-emergent condition) '[]'$  - 0 Routine Hospital Admission (non-emergent condition) '[]'$  - 0 New Admissions / Biomedical engineer / Ordering NPWT Apligraf, etc. , '[]'$  - 0 Emergency Hospital Admission (emergent condition) X- 1 10 Simple Discharge Coordination '[]'$  - 0 Complex (extensive) Discharge Coordination PROCESS - Special Needs '[]'$  - 0 Pediatric / Minor Patient Management '[]'$  - 0 Isolation Patient Management '[]'$  - 0 Hearing / Language / Visual special needs '[]'$  - 0 Assessment of Community assistance (transportation, D/C planning, etc.) '[]'$  -  0 Additional assistance / Altered mentation '[]'$  - 0 Support Surface(s) Assessment (bed, cushion, seat, etc.) INTERVENTIONS - Wound Cleansing /  rea 0.075 N/A N/A Volume (cm) : 28.90% N/A N/A % Reduction in A rea: 69.60% N/A N/A % Reduction in Volume: 1 Starting Position 1 (o'clock): 5 Ending Position 1 (o'clock): 2.5 Maximum Distance 1 (cm): Yes N/A N/A Undermining: Category/Stage IV N/A N/A Classification: Medium N/A N/A Exudate A mount: Serosanguineous N/A N/A Exudate Type: red, brown N/A N/A Exudate Color: Distinct, outline attached N/A N/A Wound Margin: Large (67-100%) N/A N/A Granulation A mount: Pink, Pale N/A N/A Granulation Quality: Small (1-33%) N/A N/A Necrotic A mount: Fat Layer (Subcutaneous Tissue): Yes N/A N/A Exposed Structures: Muscle: Yes Fascia: No Tendon: No Joint: No Bone: No None N/A N/A Epithelialization: No Abnormalities Noted N/A N/A Periwound Skin Texture: No Abnormalities Noted N/A N/A Periwound Skin Moisture: Hemosiderin Staining: Yes N/A N/A Periwound Skin Color: No Abnormality N/A N/A Temperature: Treatment Notes Wound #1 (Sacrum) Cleanser Wound Cleanser Discharge Instruction: Cleanse the wound with wound cleanser prior to applying a clean dressing using gauze sponges, not tissue or cotton balls. Peri-Wound Care Skin Prep Discharge Instruction: Use skin prep as directed Topical Primary Dressing Hydrofera Blue Ready Foam, 2.5 x2.5 in Discharge Instruction: Lightly pack into wound bed. [name], [name] ([id number]) [id number] Page 5 of 9 Secondary Dressing Zetuvit Plus Silicone Border Dressing 4x4  (in/in) Discharge Instruction: Apply silicone border over primary dressing as directed. Secured With Compression Wrap Compression Stockings Environmental education officer) Signed: 01/16/2022 3:30:35 PM By: Linton Ham MD Entered By: Linton Ham on 01/16/2022 12:45:19 -------------------------------------------------------------------------------- Multi-Disciplinary Care Plan Details Patient Name: Date of Service: Marissa Perking Cardenas. 01/16/2022 11:30 A M Medical Record Number: [id number] Patient Account Number: [id number] Date of Birth/Sex: Treating RN: 1950-05-29 (70 y.o. [name], [name] Primary Care Hagen Tidd: Frederik Pear., RO BERT Other Clinician: Referring Archer Moist: Treating Chibuikem Thang/Extender: Gerald Leitz., RO BERT Weeks in Treatment: 2 Active Inactive Pain, Acute or Chronic Nursing Diagnoses: Pain, acute or chronic: actual or potential Potential alteration in comfort, pain Goals: Patient will verbalize adequate pain control and receive pain control interventions during procedures as needed Date Initiated: 01/02/2022 Target Resolution Date: 03/09/2022 Goal Status: Active Patient/caregiver will verbalize adequate pain control between visits Date Initiated: 01/02/2022 Target Resolution Date: 03/09/2022 Goal Status: Active Interventions: Encourage patient to take pain medications as prescribed Provide education on pain management Reposition patient for comfort Treatment Activities: Administer pain control measures as ordered : 01/02/2022 Notes: Pressure Nursing Diagnoses: Knowledge deficit related to management of pressures ulcers Goals: Patient/caregiver will verbalize risk factors for pressure ulcer development Date Initiated: 01/02/2022 Target Resolution Date: 03/09/2022 Goal Status: Active Interventions: Assess: immobility, friction, shearing, incontinence upon admission and as needed Assess offloading mechanisms upon admission and as  needed Provide education on pressure ulcers COLLIE, KITTEL Cardenas ([id number]) [id number] Page 6 of 9 Treatment Activities: T ordered outside of clinic : 01/02/2022 est Notes: Electronic Signature(s) Signed: 01/16/2022 3:55:23 PM By: [name] Entered By: [name] on 01/16/2022 12:09:52 -------------------------------------------------------------------------------- Pain Assessment Details Patient Name: Date of Service: Marissa Perking Cardenas. 01/16/2022 11:30 A M Medical Record Number: [id number] Patient Account Number: [id number] Date of Birth/Sex: Treating RN: January 21, 1951 (71 y.o. [name] Primary Care Monterio Bob: Frederik Pear., RO BERT Other Clinician: Referring Ferrah Panagopoulos: Treating Loudon Krakow/Extender: Gerald Leitz., RO BERT Weeks in Treatment: 2 Active Problems Location of Pain Severity and Description of Pain Patient Has Paino Yes Site Locations Rate the pain. Current Pain Level: 4 Pain Management and Medication Current Pain Management: Electronic Signature(s) Signed: 01/16/2022 3:10:50 PM  [name], [name] ([id number]) [id number] Page 1 of 9 Visit Report for 01/16/2022 Arrival Information Details Patient Name: Date of Service: [name]. 01/16/2022 11:30 A M Medical Record Number: [id number] Patient Account Number: [id number] Date of Birth/Sex: Treating RN: October 25, 1950 (71 y.o. [name] Primary Care Danaysha Kirn: Frederik Pear., RO BERT Other Clinician: Referring Niharika Savino: Treating Stephon Weathers/Extender: Gerald Leitz., RO BERT Weeks in Treatment: 2 Visit Information History Since Last Visit Added or deleted any medications: No Patient Arrived: Ambulatory Any new allergies or adverse reactions: No Arrival Time: 11:52 Had a fall or experienced change in No Accompanied By: self activities of daily living that may affect Transfer Assistance: None risk of falls: Patient Requires Transmission-Based Precautions: No Signs or symptoms of abuse/neglect since last visito No Patient Has Alerts: No Hospitalized since last visit: No Implantable device outside of the clinic excluding No cellular tissue based products placed in the center since last visit: Has Dressing in Place as Prescribed: Yes Pain Present Now: Yes Electronic Signature(s) Signed: 01/16/2022 3:10:50 PM By: Sharyn Creamer RN, BSN Entered By: Sharyn Creamer on 01/16/2022 11:52:50 -------------------------------------------------------------------------------- Clinic Level of Care Assessment Details Patient Name: Date of Service: [name]. 01/16/2022 11:30 A M Medical Record Number: [id number] Patient Account Number: [id number] Date of Birth/Sex: Treating RN: December 09, 1950 (71 y.o. [name], [name] Primary Care Tanayah Squitieri: Frederik Pear., RO BERT Other Clinician: Referring Lucretia Pendley: Treating Isaid Salvia/Extender: Gerald Leitz., RO BERT Weeks in Treatment: 2 Clinic Level of Care Assessment Items TOOL 4 Quantity Score X- 1 0 Use when only an EandM  is performed on FOLLOW-UP visit ASSESSMENTS - Nursing Assessment / Reassessment X- 1 10 Reassessment of Marissa-morbidities (includes updates in patient status) X- 1 5 Reassessment of Adherence to Treatment Plan ASSESSMENTS - Wound and Skin A ssessment / Reassessment X - Simple Wound Assessment / Reassessment - one wound 1 5 '[]'$  - 0 Complex Wound Assessment / Reassessment - multiple wounds '[]'$  - 0 Dermatologic / Skin Assessment (not related to wound area) ASSESSMENTS - Focused Assessment '[]'$  - 0 Circumferential Edema Measurements - multi extremities '[]'$  - 0 Nutritional Assessment / Counseling / Intervention [name], [name] ([id number]) [phone].pdf Page 2 of 9 '[]'$  - 0 Lower Extremity Assessment (monofilament, tuning fork, pulses) '[]'$  - 0 Peripheral Arterial Disease Assessment (using hand held doppler) ASSESSMENTS - Ostomy and/or Continence Assessment and Care '[]'$  - 0 Incontinence Assessment and Management '[]'$  - 0 Ostomy Care Assessment and Management (repouching, etc.) PROCESS - Coordination of Care X - Simple Patient / Family Education for ongoing care 1 15 '[]'$  - 0 Complex (extensive) Patient / Family Education for ongoing care X- 1 10 Staff obtains Programmer, systems, Records, T Results / Process Orders est X- 1 10 Staff telephones HHA, Nursing Homes / Clarify orders / etc '[]'$  - 0 Routine Transfer to another Facility (non-emergent condition) '[]'$  - 0 Routine Hospital Admission (non-emergent condition) '[]'$  - 0 New Admissions / Biomedical engineer / Ordering NPWT Apligraf, etc. , '[]'$  - 0 Emergency Hospital Admission (emergent condition) X- 1 10 Simple Discharge Coordination '[]'$  - 0 Complex (extensive) Discharge Coordination PROCESS - Special Needs '[]'$  - 0 Pediatric / Minor Patient Management '[]'$  - 0 Isolation Patient Management '[]'$  - 0 Hearing / Language / Visual special needs '[]'$  - 0 Assessment of Community assistance (transportation, D/C planning, etc.) '[]'$  -  0 Additional assistance / Altered mentation '[]'$  - 0 Support Surface(s) Assessment (bed, cushion, seat, etc.) INTERVENTIONS - Wound Cleansing /  [name], [name] ([id number]) [id number] Page 1 of 9 Visit Report for 01/16/2022 Arrival Information Details Patient Name: Date of Service: [name]. 01/16/2022 11:30 A M Medical Record Number: [id number] Patient Account Number: [id number] Date of Birth/Sex: Treating RN: October 25, 1950 (71 y.o. [name] Primary Care Danaysha Kirn: Frederik Pear., RO BERT Other Clinician: Referring Niharika Savino: Treating Stephon Weathers/Extender: Gerald Leitz., RO BERT Weeks in Treatment: 2 Visit Information History Since Last Visit Added or deleted any medications: No Patient Arrived: Ambulatory Any new allergies or adverse reactions: No Arrival Time: 11:52 Had a fall or experienced change in No Accompanied By: self activities of daily living that may affect Transfer Assistance: None risk of falls: Patient Requires Transmission-Based Precautions: No Signs or symptoms of abuse/neglect since last visito No Patient Has Alerts: No Hospitalized since last visit: No Implantable device outside of the clinic excluding No cellular tissue based products placed in the center since last visit: Has Dressing in Place as Prescribed: Yes Pain Present Now: Yes Electronic Signature(s) Signed: 01/16/2022 3:10:50 PM By: Sharyn Creamer RN, BSN Entered By: Sharyn Creamer on 01/16/2022 11:52:50 -------------------------------------------------------------------------------- Clinic Level of Care Assessment Details Patient Name: Date of Service: [name]. 01/16/2022 11:30 A M Medical Record Number: [id number] Patient Account Number: [id number] Date of Birth/Sex: Treating RN: December 09, 1950 (71 y.o. [name], [name] Primary Care Tanayah Squitieri: Frederik Pear., RO BERT Other Clinician: Referring Lucretia Pendley: Treating Isaid Salvia/Extender: Gerald Leitz., RO BERT Weeks in Treatment: 2 Clinic Level of Care Assessment Items TOOL 4 Quantity Score X- 1 0 Use when only an EandM  is performed on FOLLOW-UP visit ASSESSMENTS - Nursing Assessment / Reassessment X- 1 10 Reassessment of Marissa-morbidities (includes updates in patient status) X- 1 5 Reassessment of Adherence to Treatment Plan ASSESSMENTS - Wound and Skin A ssessment / Reassessment X - Simple Wound Assessment / Reassessment - one wound 1 5 '[]'$  - 0 Complex Wound Assessment / Reassessment - multiple wounds '[]'$  - 0 Dermatologic / Skin Assessment (not related to wound area) ASSESSMENTS - Focused Assessment '[]'$  - 0 Circumferential Edema Measurements - multi extremities '[]'$  - 0 Nutritional Assessment / Counseling / Intervention [name], [name] ([id number]) [phone].pdf Page 2 of 9 '[]'$  - 0 Lower Extremity Assessment (monofilament, tuning fork, pulses) '[]'$  - 0 Peripheral Arterial Disease Assessment (using hand held doppler) ASSESSMENTS - Ostomy and/or Continence Assessment and Care '[]'$  - 0 Incontinence Assessment and Management '[]'$  - 0 Ostomy Care Assessment and Management (repouching, etc.) PROCESS - Coordination of Care X - Simple Patient / Family Education for ongoing care 1 15 '[]'$  - 0 Complex (extensive) Patient / Family Education for ongoing care X- 1 10 Staff obtains Programmer, systems, Records, T Results / Process Orders est X- 1 10 Staff telephones HHA, Nursing Homes / Clarify orders / etc '[]'$  - 0 Routine Transfer to another Facility (non-emergent condition) '[]'$  - 0 Routine Hospital Admission (non-emergent condition) '[]'$  - 0 New Admissions / Biomedical engineer / Ordering NPWT Apligraf, etc. , '[]'$  - 0 Emergency Hospital Admission (emergent condition) X- 1 10 Simple Discharge Coordination '[]'$  - 0 Complex (extensive) Discharge Coordination PROCESS - Special Needs '[]'$  - 0 Pediatric / Minor Patient Management '[]'$  - 0 Isolation Patient Management '[]'$  - 0 Hearing / Language / Visual special needs '[]'$  - 0 Assessment of Community assistance (transportation, D/C planning, etc.) '[]'$  -  0 Additional assistance / Altered mentation '[]'$  - 0 Support Surface(s) Assessment (bed, cushion, seat, etc.) INTERVENTIONS - Wound Cleansing /

## 2022-01-16 NOTE — Progress Notes (Signed)
YULIZA, CARA Cardenas ([id number]) [id number] Page 1 of 5 Visit Report for 01/16/2022 HPI Details Patient Name: Date of Service: [name]. 01/16/2022 11:30 A M Medical Record Number: [id number] Patient Account Number: [id number] Date of Birth/Sex: Treating RN: 13-Nov-1950 (71 y.o. F) Primary Care Provider: Frederik Pear., RO BERT Other Clinician: Referring Provider: Treating Provider/Extender: Gerald Leitz., RO BERT Weeks in Treatment: 2 History of Present Illness HPI Description: 01-02-22 upon evaluation today patient presents for initial inspection here in our clinic concerning a wound that she has over the sacral region. This is stated to be present since around the beginning of August 2023. She currently resides in a assisted nursing facility. She does seem to be able to answer questions fully today. Subsequently I do note that she is a little on the thin side but again other than the protein calorie malnutrition she is minimally weak but still does get up and move around some but she also tells me that she "sits a lot". She has not had any x-rays of the sacral region at this point. 01-09-2022 upon evaluation today patient's wound in the sacral area actually appears to be doing decently well. Fortunately I do not see any signs of infection which is great news and overall I am extremely pleased with where things stand today. 11/8; this is a patient who lives in some form of small assisted living in [place]. She has a deep wound over the lower part of her coccyx. An x-ray that we ordered from last time did not show any osseous abnormalities. We are supposed to be using Hydrofera Blue in the wound but I am really not sure what they are using to dress this and who is doing it. Talking to our staff they have apparently discussed this with the staff in the facility. She does not have home health. Electronic Signature(s) Signed: 01/16/2022 3:30:35 PM  By: Linton Ham MD Entered By: Linton Ham on 01/16/2022 12:46:59 -------------------------------------------------------------------------------- Physical Exam Details Patient Name: Date of Service: Marissa Perking Cardenas. 01/16/2022 11:30 A M Medical Record Number: [id number] Patient Account Number: [id number] Date of Birth/Sex: Treating RN: Sep 06, 1950 (71 y.o. F) Primary Care Provider: Frederik Pear., RO BERT Other Clinician: Referring Provider: Treating Provider/Extender: Gerald Leitz., RO BERT Weeks in Treatment: 2 Constitutional Sitting or standing Blood Pressure is within target range for patient.. Pulse regular and within target range for patient.Marland Kitchen Respirations regular, non-labored and within target range.. Temperature is normal and within the target range for the patient.Marland Kitchen Appears in no distress. Notes Wound exam; lower sacrum. This is a small but deep wound relatively it also has undermining at the base from at least 12:00 to 6:00. There is no obvious purulent drainage but what I can see of the wound bed does not look healthy Electronic Signature(s) Signed: 01/16/2022 3:30:35 PM By: Linton Ham MD Entered By: Linton Ham on 01/16/2022 12:48:38 [name] ([id number]) [id number] Page 2 of 5 -------------------------------------------------------------------------------- Physician Orders Details Patient Name: Date of Service: [name] 01/16/2022 11:30 A M Medical Record Number: [id number] Patient Account Number: [id number] Date of Birth/Sex: Treating RN: Jan 14, 1951 (71 y.o. [name], [name] Primary Care Provider: Frederik Pear., RO BERT Other Clinician: Referring Provider: Treating Provider/Extender: Gerald Leitz., RO BERT Weeks in Treatment: 2 Verbal / Phone Orders: No Diagnosis Coding Follow-up Appointments ppointment in 1 week. Burman Blacksmith, [place] [facility] Return A Anesthetic (In clinic)  Topical  Lidocaine 5% applied to wound bed Bathing/ Shower/ Hygiene May shower with protection but do not get wound dressing(s) wet. Off-Loading Turn and reposition every 2 hours Home Health No change in wound care orders this week; continue Home Health for wound care. May utilize formulary equivalent dressing for wound treatment orders unless otherwise specified. - home health to change Mondays and Wednesdays. Hydrofera blue lightly pack into wound bed. Wound Treatment Wound #1 - Sacrum Cleanser: Wound Cleanser (Home Health) 3 x Per Week/30 Days Discharge Instructions: Cleanse the wound with wound cleanser prior to applying a clean dressing using gauze sponges, not tissue or cotton balls. Peri-Wound Care: Skin Prep (Home Health) 3 x Per Week/30 Days Discharge Instructions: Use skin prep as directed Prim Dressing: Hydrofera Blue Ready Foam, 2.5 x2.5 in (Home Health) 3 x Per Week/30 Days ary Discharge Instructions: Lightly pack into wound bed. Secondary Dressing: Zetuvit Plus Silicone Border Dressing 4x4 (in/in) (Home Health) 3 x Per Week/30 Days Discharge Instructions: Apply silicone border over primary dressing as directed. Electronic Signature(s) Signed: 01/16/2022 3:30:35 PM By: Linton Ham MD Signed: 01/16/2022 3:55:23 PM By: [name] Entered By: [name] on 01/16/2022 12:09:38 -------------------------------------------------------------------------------- Problem List Details Patient Name: Date of Service: Marissa Marissa Compton Cardenas. 01/16/2022 11:30 A M Medical Record Number: [id number] Patient Account Number: [id number] Date of Birth/Sex: Treating RN: 1951/01/20 (71 y.o. F) Primary Care Provider: Frederik Pear., RO BERT Other Clinician: Referring Provider: Treating Provider/Extender: Gerald Leitz., RO BERT Weeks in Treatment: 2 Active Problems ICD-10 Encounter Code Description Active Date MDM Diagnosis M62.81 Muscle weakness (generalized)  01/02/2022 No Yes [name], [name] ([id number]) [id number].pdf Page 3 of 5 E43 Unspecified severe protein-calorie malnutrition 01/02/2022 No Yes L89.154 Pressure ulcer of sacral region, stage 4 01/02/2022 No Yes Inactive Problems Resolved Problems Electronic Signature(s) Signed: 01/16/2022 3:30:35 PM By: Linton Ham MD Entered By: Linton Ham on 01/16/2022 12:45:12 -------------------------------------------------------------------------------- Progress Note Details Patient Name: Date of Service: Marissa Perking Cardenas. 01/16/2022 11:30 A M Medical Record Number: [id number] Patient Account Number: [id number] Date of Birth/Sex: Treating RN: 1950-05-02 (71 y.o. F) Primary Care Provider: Frederik Pear., RO BERT Other Clinician: Referring Provider: Treating Provider/Extender: Gerald Leitz., RO BERT Weeks in Treatment: 2 Subjective History of Present Illness (HPI) 01-02-22 upon evaluation today patient presents for initial inspection here in our clinic concerning a wound that she has over the sacral region. This is stated to be present since around the beginning of August 2023. She currently resides in a assisted nursing facility. She does seem to be able to answer questions fully today. Subsequently I do note that she is a little on the thin side but again other than the protein calorie malnutrition she is minimally weak but still does get up and move around some but she also tells me that she "sits a lot". She has not had any x-rays of the sacral region at this point. 01-09-2022 upon evaluation today patient's wound in the sacral area actually appears to be doing decently well. Fortunately I do not see any signs of infection which is great news and overall I am extremely pleased with where things stand today. 11/8; this is a patient who lives in some form of small assisted living in [place]. She has a deep wound over the lower part of her coccyx. An  x-ray that we ordered from last time did not show any osseous abnormalities. We are supposed to be using Hydrofera Blue in the  wound but I am really not sure what they are using to dress this and who is doing it. Talking to our staff they have apparently discussed this with the staff in the facility. She does not have home health. Objective Constitutional Sitting or standing Blood Pressure is within target range for patient.. Pulse regular and within target range for patient.Marland Kitchen Respirations regular, non-labored and within target range.. Temperature is normal and within the target range for the patient.Marland Kitchen Appears in no distress. Vitals Time Taken: 11:52 AM, Height: 64 in, Weight: 97 lbs, BMI: 16.6, Temperature: 98.1 F, Pulse: 69 bpm, Respiratory Rate: 18 breaths/min, Blood Pressure: 125/74 mmHg. General Notes: Wound exam; lower sacrum. This is a small but deep wound relatively it also has undermining at the base from at least 12:00 to 6:00. There is no obvious purulent drainage but what I can see of the wound bed does not look healthy Integumentary (Hair, Skin) Wound #1 status is Open. Original cause of wound was Pressure Injury. The date acquired was: 10/09/2021. The wound has been in treatment 2 weeks. The wound is located on the Sacrum. The wound measures 0.8cm length x 0.4cm width x 0.3cm depth; 0.251cm^2 area and 0.075cm^3 volume. There is muscle and Fat Layer (Subcutaneous Tissue) exposed. There is no tunneling noted, however, there is undermining starting at 1:00 and ending at 5:00 with a maximum distance of 2.5cm. There is a medium amount of serosanguineous drainage noted. The wound margin is distinct with the outline attached to the wound base. There is large (67-100%) pink, pale granulation within the wound bed. There is a small (1-33%) amount of necrotic tissue within the wound bed including Adherent Slough and Necrosis of Muscle. The periwound skin appearance had no abnormalities noted for  texture. The periwound skin appearance had no abnormalities noted for moisture. The periwound skin appearance had no abnormalities noted for color. Periwound temperature was noted as No Abnormality. [name], [name] ([id number]) [id number] Page 4 of 5 Assessment Active Problems ICD-10 Muscle weakness (generalized) Unspecified severe protein-calorie malnutrition Pressure ulcer of sacral region, stage 4 Plan Follow-up Appointments: Return Appointment in 1 week. Burman Blacksmith, [place] [facility] Anesthetic: (In clinic) Topical Lidocaine 5% applied to wound bed Bathing/ Shower/ Hygiene: May shower with protection but do not get wound dressing(s) wet. Off-Loading: Turn and reposition every 2 hours Home Health: No change in wound care orders this week; continue Home Health for wound care. May utilize formulary equivalent dressing for wound treatment orders unless otherwise specified. - home health to change Mondays and Wednesdays. Hydrofera blue lightly pack into wound bed. WOUND #1: - Sacrum Wound Laterality: Cleanser: Wound Cleanser (Home Health) 3 x Per Week/30 Days Discharge Instructions: Cleanse the wound with wound cleanser prior to applying a clean dressing using gauze sponges, not tissue or cotton balls. Peri-Wound Care: Skin Prep (Home Health) 3 x Per Week/30 Days Discharge Instructions: Use skin prep as directed Prim Dressing: Hydrofera Blue Ready Foam, 2.5 x2.5 in (Home Health) 3 x Per Week/30 Days ary Discharge Instructions: Lightly pack into wound bed. Secondary Dressing: Zetuvit Plus Silicone Border Dressing 4x4 (in/in) (Home Health) 3 x Per Week/30 Days Discharge Instructions: Apply silicone border over primary dressing as directed. 1. Small but deep wound in the lower sacrum. This also has significant undermining. Plain x-ray is negative but I wonder whether she will need more advanced imaging. 2. Also equally concerning it does not seem that we are  definitively able to determine whether what we have ordered for her  is actually happening. She does not have home health. There is some suggestion that an "RN" comes into the facility to change the dressing but I am not really sure what they are putting into it. Nor my certain about adequate offloading etc. Electronic Signature(s) Signed: 01/16/2022 3:30:35 PM By: Linton Ham MD Entered By: Linton Ham on 01/16/2022 12:50:20 -------------------------------------------------------------------------------- SuperBill Details Patient Name: Date of Service: Marissa Perking Cardenas. 01/16/2022 Medical Record Number: [id number] Patient Account Number: [id number] Date of Birth/Sex: Treating RN: 02/13/1951 (71 y.o. [name], [name] Primary Care Provider: Frederik Pear., RO BERT Other Clinician: Referring Provider: Treating Provider/Extender: Gerald Leitz., RO BERT Weeks in Treatment: 2 Diagnosis Coding ICD-10 Codes Code Description M62.81 Muscle weakness (generalized) E43 Unspecified severe protein-calorie malnutrition L89.154 Pressure ulcer of sacral region, stage [address] Facility Procedures KAAREN, NASS ([id number]): CPT4 Code Description [id number] [place] VISIT-LEV 3 EST PT [id number] Page 5 of 5: Modifier Quantity 1 Physician Procedures : CPT4 Code Description Modifier [id number] - WC PHYS LEVEL 3 - EST PT ICD-10 Diagnosis Description L89.154 Pressure ulcer of sacral region, stage 4 E43 Unspecified severe protein-calorie malnutrition Quantity: 1 Electronic Signature(s) Signed: 01/16/2022 3:30:35 PM By: Linton Ham MD Entered By: Linton Ham on 01/16/2022 12:50:42

## 2022-01-23 ENCOUNTER — Encounter (HOSPITAL_BASED_OUTPATIENT_CLINIC_OR_DEPARTMENT_OTHER): Payer: Medicare Other | Admitting: Physician Assistant

## 2022-01-23 DIAGNOSIS — L89154 Pressure ulcer of sacral region, stage 4: Secondary | ICD-10-CM | POA: Diagnosis not present

## 2022-01-23 DIAGNOSIS — Z681 Body mass index (BMI) 19 or less, adult: Secondary | ICD-10-CM | POA: Diagnosis not present

## 2022-01-23 DIAGNOSIS — E43 Unspecified severe protein-calorie malnutrition: Secondary | ICD-10-CM | POA: Diagnosis not present

## 2022-01-23 DIAGNOSIS — M6281 Muscle weakness (generalized): Secondary | ICD-10-CM | POA: Diagnosis not present

## 2022-01-23 NOTE — Progress Notes (Addendum)
HARIKA, LAIDLAW Cardenas ([id number]) [id number] Page 1 of 6 Cardenas Report for 01/23/2022 Chief Complaint Document Details Patient Name: Date of Service: [name] 01/23/2022 10:45 A M Medical Record Number: [id number] Patient Account Number: [id number] Date of Birth/Sex: Treating RN: 16-Apr-1950 (71 y.o. F) Primary Care Provider: Frederik Pear., RO BERT Other Clinician: Referring Provider: Treating Provider/Extender: Gareth Morgan., RO BERT Weeks in Treatment: 3 Information Obtained from: Patient Chief Complaint Pressure ulcer stage 3 Sacrum Electronic Signature(s) Signed: 01/23/2022 10:50:30 AM By: Worthy Keeler PA-C Entered By: Worthy Keeler on 01/23/2022 10:50:30 -------------------------------------------------------------------------------- HPI Details Patient Name: Date of Service: Marissa [name]. 01/23/2022 10:45 A M Medical Record Number: [id number] Patient Account Number: [id number] Date of Birth/Sex: Treating RN: Aug 10, 1950 (71 y.o. F) Primary Care Provider: Frederik Pear., RO BERT Other Clinician: Referring Provider: Treating Provider/Extender: Gareth Morgan., RO BERT Weeks in Treatment: 3 History of Present Illness HPI Description: 01-02-22 upon evaluation today patient presents for initial inspection here in our clinic concerning a wound that she has over the sacral region. This is stated to be present since around the beginning of August 2023. She currently resides in a assisted nursing facility. She does seem to be able to answer questions fully today. Subsequently I do note that she is a little on the thin side but again other than the protein calorie malnutrition she is minimally weak but still does get up and move around some but she also tells me that she "sits a lot". She has not had any x-rays of the sacral region at this point. 01-09-2022 upon evaluation today patient's wound in the sacral area actually  appears to be doing decently well. Fortunately I do not see any signs of infection which is great news and overall I am extremely pleased with where things stand today. 11/8; this is a patient who lives in some form of small assisted living in [place]. She has a deep wound over the lower part of her coccyx. An x-ray that we ordered from last time did not show any osseous abnormalities. We are supposed to be using Hydrofera Blue in the wound but I am really not sure what they are using to dress this and who is doing it. Talking to our staff they have apparently discussed this with the staff in the facility. She does not have home health. 01-23-2022 upon evaluation today patient appears to be doing decently well in regard to her wound. She has been tolerating the dressing changes without complication although I am not certain the dressing changes have been done appropriately over the past couple of weeks. We have been trying to get her to come in here we also try to get her into a wound care center in [place] which would be closer for the assisted living facility. Unfortunately neither 1 of those were undertaken up to this point. The patient did end up having some training of the staff from an RN that they brought him to have the staff perform the dressing changes but that being said it does not sound like this has been done correctly. Nonetheless I do believe that based on what we see it would benefit the patient to actually have her come here 3 times a week also think a wound VAC could potentially be a possibility for her which would help to get things moving in a much better direction as far as healing is concerned. We need  to get this to fill-in though it looks clean and does not look infected I do think that we need to really get this to fill-in and there is quite a bit of undermining that is to be considered here. Electronic Signature(s) Signed: 01/23/2022 12:32:25 PM By: Worthy Keeler  PA-C Entered By: Worthy Keeler on 01/23/2022 12:32:25 [name] ([id number]) [id number] Page 2 of 6 -------------------------------------------------------------------------------- Physical Exam Details Patient Name: Date of Service: [name] 01/23/2022 10:45 A M Medical Record Number: [id number] Patient Account Number: [id number] Date of Birth/Sex: Treating RN: [birth year] (71 y.o. F) Primary Care Provider: Frederik Pear., RO BERT Other Clinician: Referring Provider: Treating Provider/Extender: Gareth Morgan., RO BERT Weeks in Treatment: 3 Constitutional Well-nourished and well-hydrated in no acute distress. Respiratory normal breathing without difficulty. Psychiatric this patient is able to make decisions and demonstrates good insight into disease process. Alert and Oriented x 3. pleasant and cooperative. Notes Upon inspection patient's wound bed actually showed signs of good granulation and epithelization at this point. Fortunately I do not see any evidence of infection locally or systemically which is great news and overall I am pleased in that regard with that being said I am not certain the dressings been done correctly over the past 2 weeks. Electronic Signature(s) Signed: 01/23/2022 12:32:44 PM By: Worthy Keeler PA-C Entered By: Worthy Keeler on 01/23/2022 12:32:44 -------------------------------------------------------------------------------- Physician Orders Details Patient Name: Date of Service: Marissa [name]. 01/23/2022 10:45 A M Medical Record Number: [id number] Patient Account Number: [id number] Date of Birth/Sex: Treating RN: [birth year] (71 y.o. [name], [name] Primary Care Provider: Frederik Pear., RO BERT Other Clinician: Referring Provider: Treating Provider/Extender: Gareth Morgan., RO BERT Weeks in Treatment: 3 Verbal / Phone Orders: No Diagnosis Coding ICD-10 Coding Code  Description M62.81 Muscle weakness (generalized) E43 Unspecified severe protein-calorie malnutrition L89.154 Pressure ulcer of sacral region, stage 4 Follow-up Appointments ppointment in 1 week. [name], Cale Return A Nurse Cardenas: - Monday's and Friday's ***this Friday 11/17 @ 0930 and 11/20 @ 0830- can add nurse Cardenas to room #8 slot Other: - Administrator Arbutus Ped) : [phone] Anesthetic (In clinic) Topical Lidocaine 5% applied to wound bed Bathing/ Shower/ Hygiene May shower with protection but do not get wound dressing(s) wet. - If dressing gets wet, please change right away as it can lead to infection/worsening of wound if not changed. ***Patient to only shower on days dressing gets changed (MOnday,Wednesday,Friday). She should shower just before dressing changes and immediately have dressing put back on after shower is doneTSION, [name] ([id number]) [phone].pdf Page 3 of 6 Off-Loading Turn and reposition every 2 hours Wound Treatment Wound #1 - Sacrum Cleanser: Wound Cleanser (Home Health) 3 x Per Week/30 Days Discharge Instructions: Cleanse the wound with wound cleanser prior to applying a clean dressing using gauze sponges, not tissue or cotton balls. Peri-Wound Care: Skin Prep (Home Health) 3 x Per Week/30 Days Discharge Instructions: Use skin prep as directed Prim Dressing: Hydrofera Blue Ready Foam, 2.5 x2.5 in (Home Health) 3 x Per Week/30 Days ary Discharge Instructions: Lightly pack into wound bed. Secondary Dressing: Zetuvit Plus Silicone Border Dressing 4x4 (in/in) (Home Health) 3 x Per Week/30 Days Discharge Instructions: Apply silicone border over primary dressing as directed. Electronic Signature(s) Signed: 01/23/2022 3:58:30 PM By: Worthy Keeler PA-C Signed: 01/25/2022 12:09:11 PM By: [name] Entered By: [name] on 01/23/2022  11:27:17 --------------------------------------------------------------------------------  Problem List Details Patient Name: Date of Service: [name] 01/23/2022 10:45 A M Medical Record Number: [id number] Patient Account Number: [id number] Date of Birth/Sex: Treating RN: 1950/12/07 (71 y.o. F) Primary Care Provider: Frederik Pear., RO BERT Other Clinician: Referring Provider: Treating Provider/Extender: Gareth Morgan., RO BERT Weeks in Treatment: 3 Active Problems ICD-10 Encounter Code Description Active Date MDM Diagnosis M62.81 Muscle weakness (generalized) 01/02/2022 No Yes E43 Unspecified severe protein-calorie malnutrition 01/02/2022 No Yes L89.154 Pressure ulcer of sacral region, stage 4 01/02/2022 No Yes Inactive Problems Resolved Problems Electronic Signature(s) Signed: 01/23/2022 10:50:23 AM By: Worthy Keeler PA-C Entered By: Worthy Keeler on 01/23/2022 10:50:23 [name] ([id number]) [id number] Page 4 of 6 -------------------------------------------------------------------------------- Progress Note Details Patient Name: Date of Service: [name] 01/23/2022 10:45 A M Medical Record Number: [id number] Patient Account Number: [id number] Date of Birth/Sex: Treating RN: 19-Nov-1950 (71 y.o. F) Primary Care Provider: Frederik Pear., RO BERT Other Clinician: Referring Provider: Treating Provider/Extender: Gareth Morgan., RO BERT Weeks in Treatment: 3 Subjective Chief Complaint Information obtained from Patient Pressure ulcer stage 3 Sacrum History of Present Illness (HPI) 01-02-22 upon evaluation today patient presents for initial inspection here in our clinic concerning a wound that she has over the sacral region. This is stated to be present since around the beginning of August 2023. She currently resides in a assisted nursing facility. She does seem to be able to answer questions  fully today. Subsequently I do note that she is a little on the thin side but again other than the protein calorie malnutrition she is minimally weak but still does get up and move around some but she also tells me that she "sits a lot". She has not had any x-rays of the sacral region at this point. 01-09-2022 upon evaluation today patient's wound in the sacral area actually appears to be doing decently well. Fortunately I do not see any signs of infection which is great news and overall I am extremely pleased with where things stand today. 11/8; this is a patient who lives in some form of small assisted living in [place]. She has a deep wound over the lower part of her coccyx. An x-ray that we ordered from last time did not show any osseous abnormalities. We are supposed to be using Hydrofera Blue in the wound but I am really not sure what they are using to dress this and who is doing it. Talking to our staff they have apparently discussed this with the staff in the facility. She does not have home health. 01-23-2022 upon evaluation today patient appears to be doing decently well in regard to her wound. She has been tolerating the dressing changes without complication although I am not certain the dressing changes have been done appropriately over the past couple of weeks. We have been trying to get her to come in here we also try to get her into a wound care center in [place] which would be closer for the assisted living facility. Unfortunately neither 1 of those were undertaken up to this point. The patient did end up having some training of the staff from an RN that they brought him to have the staff perform the dressing changes but that being said it does not sound like this has been done correctly. Nonetheless I do believe that based on what we see it would benefit the patient to actually have her come here  3 times a week also think a wound VAC could potentially be a possibility for her which  would help to get things moving in a much better direction as far as healing is concerned. We need to get this to fill-in though it looks clean and does not look infected I do think that we need to really get this to fill-in and there is quite a bit of undermining that is to be considered here. Objective Constitutional Well-nourished and well-hydrated in no acute distress. Vitals Time Taken: 10:57 AM, Height: 64 in, Weight: 97 lbs, BMI: 16.6, Temperature: 97.9 F, Pulse: 65 bpm, Respiratory Rate: 18 breaths/min, Blood Pressure: 161/89 mmHg. Respiratory normal breathing without difficulty. Psychiatric this patient is able to make decisions and demonstrates good insight into disease process. Alert and Oriented x 3. pleasant and cooperative. General Notes: Upon inspection patient's wound bed actually showed signs of good granulation and epithelization at this point. Fortunately I do not see any evidence of infection locally or systemically which is great news and overall I am pleased in that regard with that being said I am not certain the dressings been done correctly over the past 2 weeks. Integumentary (Hair, Skin) Wound #1 status is Open. Original cause of wound was Pressure Injury. The date acquired was: 10/09/2021. The wound has been in treatment 3 weeks. The wound is located on the Sacrum. The wound measures 0.5cm length x 0.4cm width x 1cm depth; 0.157cm^2 area and 0.157cm^3 volume. There is muscle and Fat Layer (Subcutaneous Tissue) exposed. There is no tunneling or undermining noted. There is a medium amount of serosanguineous drainage noted. The wound margin is distinct with the outline attached to the wound base. There is medium (34-66%) pink, pale granulation within the wound bed. There is a medium (34-66%) amount of necrotic tissue within the wound bed including Adherent Slough and Necrosis of Muscle. The periwound skin appearance had no abnormalities noted for texture. The periwound  skin appearance had no abnormalities noted for moisture. The periwound skin appearance had no abnormalities noted for color. Periwound temperature was noted as No Abnormality. [name], [name] ([id number]) [id number] Page 5 of 6 Assessment Active Problems ICD-10 Muscle weakness (generalized) Unspecified severe protein-calorie malnutrition Pressure ulcer of sacral region, stage 4 Plan Follow-up Appointments: Return Appointment in 1 week. [name], [name]: - VELFYB'O and Friday's ***this Friday 11/17 @ 0930 and 11/20 @ 0830- can add nurse Cardenas to room #8 slot Other: - Administrator Arbutus Ped) : [phone] Anesthetic: (In clinic) Topical Lidocaine 5% applied to wound bed Bathing/ Shower/ Hygiene: May shower with protection but do not get wound dressing(s) wet. - If dressing gets wet, please change right away as it can lead to infection/worsening of wound if not changed. ***Patient to only shower on days dressing gets changed (MOnday,Wednesday,Friday). She should shower just before dressing changes and immediately have dressing put back on after shower is done*** Off-Loading: Turn and reposition every 2 hours WOUND #1: - Sacrum Wound Laterality: Cleanser: Wound Cleanser (Home Health) 3 x Per Week/30 Days Discharge Instructions: Cleanse the wound with wound cleanser prior to applying a clean dressing using gauze sponges, not tissue or cotton balls. Peri-Wound Care: Skin Prep (Home Health) 3 x Per Week/30 Days Discharge Instructions: Use skin prep as directed Prim Dressing: Hydrofera Blue Ready Foam, 2.5 x2.5 in (Home Health) 3 x Per Week/30 Days ary Discharge Instructions: Lightly pack into wound bed. Secondary Dressing: Zetuvit Plus Silicone Border Dressing 4x4 (in/in) (Home Health) 3 x  Per Week/30 Days Discharge Instructions: Apply silicone border over primary dressing as directed. 1. I am going to recommend that we have the patient go  ahead and continue with the [facility] though I do believe that she may benefit from a gauze back to try to help get this to heal in much more effectively and quickly. 2. I am also can recommend that we have the patient continue with the bordered foam dressing to cover which I think is doing decently well. 3. I would also suggest she should continue with appropriate offloading. 4. I did actually discuss with the administrator the issue with the dressings currently and it has been decided that they are going to just transport her here Monday Wednesday and Friday for dressing changes. That was Myriam Jacobson that I spoke with during this conversation today. Overall I think this is probably our best bet in order to make sure that this heals appropriately. We will see patient back for reevaluation in 1 week here in the clinic. If anything worsens or changes patient will contact our office for additional recommendations. We will evaluate next week and see how things stand and potentially we can always see about ordering the wound VAC at that time if we feel like that would be appropriate. Electronic Signature(s) Signed: 01/23/2022 12:34:01 PM By: Worthy Keeler PA-C Entered By: Worthy Keeler on 01/23/2022 12:34:01 -------------------------------------------------------------------------------- SuperBill Details Patient Name: Date of Service: Marissa [name]. 01/23/2022 Medical Record Number: [id number] Patient Account Number: [id number] Date of Birth/Sex: Treating RN: [birth year] (71 y.o. [name], [name] Primary Care Provider: Frederik Pear., RO BERT Other Clinician: Referring Provider: Treating Provider/Extender: Gareth Morgan., RO BERT Weeks in Treatment: 3 Diagnosis Coding ICD-10 Codes Code Description M62.81 Muscle weakness (generalized) E43 Unspecified severe protein-calorie malnutrition L89.154 Pressure ulcer of sacral region, stage 4 [name], [name] ([id number])  [id number] Page 6 of 6 Facility Procedures : CPT4 Code: [id number] Description: 30865 - WOUND CARE Cardenas-LEV 3 EST PT Modifier: Quantity: 1 Physician Procedures : CPT4 Code Description Modifier [id number] - WC PHYS LEVEL 3 - EST PT ICD-10 Diagnosis Description M62.81 Muscle weakness (generalized) E43 Unspecified severe protein-calorie malnutrition L89.154 Pressure ulcer of sacral region, stage 4 Quantity: 1 Electronic Signature(s) Signed: 01/23/2022 12:49:15 PM By: Worthy Keeler PA-C Entered By: Worthy Keeler on 01/23/2022 12:49:14

## 2022-01-25 ENCOUNTER — Encounter (HOSPITAL_BASED_OUTPATIENT_CLINIC_OR_DEPARTMENT_OTHER): Payer: Medicare Other | Admitting: Internal Medicine

## 2022-01-25 DIAGNOSIS — K861 Other chronic pancreatitis: Secondary | ICD-10-CM | POA: Diagnosis not present

## 2022-01-25 DIAGNOSIS — Z681 Body mass index (BMI) 19 or less, adult: Secondary | ICD-10-CM | POA: Diagnosis not present

## 2022-01-25 DIAGNOSIS — M6281 Muscle weakness (generalized): Secondary | ICD-10-CM | POA: Diagnosis not present

## 2022-01-25 DIAGNOSIS — F32A Depression, unspecified: Secondary | ICD-10-CM | POA: Diagnosis not present

## 2022-01-25 DIAGNOSIS — J449 Chronic obstructive pulmonary disease, unspecified: Secondary | ICD-10-CM | POA: Diagnosis not present

## 2022-01-25 DIAGNOSIS — E43 Unspecified severe protein-calorie malnutrition: Secondary | ICD-10-CM | POA: Diagnosis not present

## 2022-01-25 DIAGNOSIS — G4700 Insomnia, unspecified: Secondary | ICD-10-CM | POA: Diagnosis not present

## 2022-01-25 DIAGNOSIS — M542 Cervicalgia: Secondary | ICD-10-CM | POA: Diagnosis not present

## 2022-01-25 DIAGNOSIS — Z79899 Other long term (current) drug therapy: Secondary | ICD-10-CM | POA: Diagnosis not present

## 2022-01-25 DIAGNOSIS — L89154 Pressure ulcer of sacral region, stage 4: Secondary | ICD-10-CM | POA: Diagnosis not present

## 2022-01-25 NOTE — Progress Notes (Signed)
0 Complex Wound Cleansing - multiple wounds X- 1 5 Wound Imaging (photographs - any number of wounds) '[]'$  - 0 Wound Tracing (instead of photographs) X- 1 5 Simple Wound Measurement - one wound '[]'$  - 0 Complex Wound Measurement - multiple wounds INTERVENTIONS - Wound Dressings X - Small Wound Dressing one or multiple wounds 1 10 '[]'$  - 0 Medium Wound Dressing one or multiple wounds '[]'$  - 0 Large Wound Dressing one or multiple wounds X- 1 5 Application of Medications - topical '[]'$  - 0 Application of Medications - injection INTERVENTIONS - Miscellaneous '[]'$  - 0 External ear exam '[]'$  - 0 Specimen Collection (cultures, biopsies, blood, body fluids, etc.) '[]'$  - 0 Specimen(s) / Culture(s) sent or taken to Lab for analysis '[]'$  - 0 Patient Transfer (multiple staff / Civil Service fast streamer / Similar devices) '[]'$  - 0 Simple Staple / Suture removal (25 or less) '[]'$  - 0 Complex Staple / Suture removal (26 or more) '[]'$  - 0 Hypo / Hyperglycemic Management (close monitor of Blood Glucose) '[]'$  - 0 Ankle / Brachial Index (ABI) - do not check if billed separately X- 1 5 Vital Signs [name], [name] ([id number]) [id number] Page 3 of 7 Has the patient been seen at the hospital within the last three years: Yes Total Score: 100 Level Of Care: New/Established - Level 3 Electronic Signature(s) Signed: 01/25/2022 12:09:11 PM By: [name] Entered By: [name] on 01/23/2022 11:29:29 -------------------------------------------------------------------------------- Encounter Discharge Information Details Patient Name: Date of Service: [name] Cardenas. 01/23/2022 10:45 A M Medical Record Number: [id number] Patient Account Number: [id number] Date of Birth/Sex: Treating RN: 1950/12/05 (71 y.o. [name],  [name] Primary Care Kaiser Belluomini: Frederik Pear., RO BERT Other Clinician: Referring Norah Fick: Treating Lashaun Krapf/Extender: Gareth Morgan., RO BERT Weeks in Treatment: 3 Encounter Discharge Information Items Discharge Condition: Stable Ambulatory Status: Ambulatory Discharge Destination: Home Transportation: Private Auto Accompanied By: self Schedule Follow-up Appointment: Yes Clinical Summary of Care: Patient Declined Electronic Signature(s) Signed: 01/25/2022 12:09:11 PM By: [name] Entered By: [name] on 01/23/2022 11:29:55 -------------------------------------------------------------------------------- Lower Extremity Assessment Details Patient Name: Date of Service: Marissa Marissa Compton Cardenas. 01/23/2022 10:45 A M Medical Record Number: [id number] Patient Account Number: [id number] Date of Birth/Sex: Treating RN: 05/20/1950 (71 y.o. F) Primary Care Janalynn Eder: Frederik Pear., RO BERT Other Clinician: Referring Taro Hidrogo: Treating Hayat Warbington/Extender: Gareth Morgan., RO BERT Weeks in Treatment: 3 Electronic Signature(s) Signed: 01/23/2022 4:33:40 PM By: Erenest Blank Entered By: Erenest Blank on 01/23/2022 10:58:11 -------------------------------------------------------------------------------- Multi-Disciplinary Care Plan Details Patient Name: Date of Service: Marissa Marissa Compton Cardenas. 01/23/2022 10:45 A M Medical Record Number: [id number] Patient Account Number: [id number] Date of Birth/Sex: Treating RN: May 07, 1950 (71 y.o. [name], [name] Primary Care Yachet Mattson: Frederik Pear., RO BERT Other Clinician: Referring Trimaine Maser: Treating Ephriam Turman/Extender: Gareth Morgan., RO BERT [place], [place] Cardenas ([id number]) [phone].pdf Page 4 of 7 Weeks in Treatment: 3 Active Inactive Pain, Acute or Chronic Nursing Diagnoses: Pain, acute or chronic: actual or potential Potential alteration in comfort, pain Goals: Patient  will verbalize adequate pain control and receive pain control interventions during procedures as needed Date Initiated: 01/02/2022 Target Resolution Date: 03/09/2022 Goal Status: Active Patient/caregiver will verbalize adequate pain control between visits Date Initiated: 01/02/2022 Target Resolution Date: 03/09/2022 Goal Status: Active Interventions: Encourage patient to take pain medications as prescribed Provide education on pain management Reposition patient for comfort Treatment Activities: Administer pain control measures as ordered : 01/02/2022  0 Complex Wound Cleansing - multiple wounds X- 1 5 Wound Imaging (photographs - any number of wounds) '[]'$  - 0 Wound Tracing (instead of photographs) X- 1 5 Simple Wound Measurement - one wound '[]'$  - 0 Complex Wound Measurement - multiple wounds INTERVENTIONS - Wound Dressings X - Small Wound Dressing one or multiple wounds 1 10 '[]'$  - 0 Medium Wound Dressing one or multiple wounds '[]'$  - 0 Large Wound Dressing one or multiple wounds X- 1 5 Application of Medications - topical '[]'$  - 0 Application of Medications - injection INTERVENTIONS - Miscellaneous '[]'$  - 0 External ear exam '[]'$  - 0 Specimen Collection (cultures, biopsies, blood, body fluids, etc.) '[]'$  - 0 Specimen(s) / Culture(s) sent or taken to Lab for analysis '[]'$  - 0 Patient Transfer (multiple staff / Civil Service fast streamer / Similar devices) '[]'$  - 0 Simple Staple / Suture removal (25 or less) '[]'$  - 0 Complex Staple / Suture removal (26 or more) '[]'$  - 0 Hypo / Hyperglycemic Management (close monitor of Blood Glucose) '[]'$  - 0 Ankle / Brachial Index (ABI) - do not check if billed separately X- 1 5 Vital Signs [name], [name] ([id number]) [id number] Page 3 of 7 Has the patient been seen at the hospital within the last three years: Yes Total Score: 100 Level Of Care: New/Established - Level 3 Electronic Signature(s) Signed: 01/25/2022 12:09:11 PM By: [name] Entered By: [name] on 01/23/2022 11:29:29 -------------------------------------------------------------------------------- Encounter Discharge Information Details Patient Name: Date of Service: [name] Cardenas. 01/23/2022 10:45 A M Medical Record Number: [id number] Patient Account Number: [id number] Date of Birth/Sex: Treating RN: 1950/12/05 (71 y.o. [name],  [name] Primary Care Kaiser Belluomini: Frederik Pear., RO BERT Other Clinician: Referring Norah Fick: Treating Lashaun Krapf/Extender: Gareth Morgan., RO BERT Weeks in Treatment: 3 Encounter Discharge Information Items Discharge Condition: Stable Ambulatory Status: Ambulatory Discharge Destination: Home Transportation: Private Auto Accompanied By: self Schedule Follow-up Appointment: Yes Clinical Summary of Care: Patient Declined Electronic Signature(s) Signed: 01/25/2022 12:09:11 PM By: [name] Entered By: [name] on 01/23/2022 11:29:55 -------------------------------------------------------------------------------- Lower Extremity Assessment Details Patient Name: Date of Service: Marissa Marissa Compton Cardenas. 01/23/2022 10:45 A M Medical Record Number: [id number] Patient Account Number: [id number] Date of Birth/Sex: Treating RN: 05/20/1950 (71 y.o. F) Primary Care Janalynn Eder: Frederik Pear., RO BERT Other Clinician: Referring Taro Hidrogo: Treating Hayat Warbington/Extender: Gareth Morgan., RO BERT Weeks in Treatment: 3 Electronic Signature(s) Signed: 01/23/2022 4:33:40 PM By: Erenest Blank Entered By: Erenest Blank on 01/23/2022 10:58:11 -------------------------------------------------------------------------------- Multi-Disciplinary Care Plan Details Patient Name: Date of Service: Marissa Marissa Compton Cardenas. 01/23/2022 10:45 A M Medical Record Number: [id number] Patient Account Number: [id number] Date of Birth/Sex: Treating RN: May 07, 1950 (71 y.o. [name], [name] Primary Care Yachet Mattson: Frederik Pear., RO BERT Other Clinician: Referring Trimaine Maser: Treating Ephriam Turman/Extender: Gareth Morgan., RO BERT [place], [place] Cardenas ([id number]) [phone].pdf Page 4 of 7 Weeks in Treatment: 3 Active Inactive Pain, Acute or Chronic Nursing Diagnoses: Pain, acute or chronic: actual or potential Potential alteration in comfort, pain Goals: Patient  will verbalize adequate pain control and receive pain control interventions during procedures as needed Date Initiated: 01/02/2022 Target Resolution Date: 03/09/2022 Goal Status: Active Patient/caregiver will verbalize adequate pain control between visits Date Initiated: 01/02/2022 Target Resolution Date: 03/09/2022 Goal Status: Active Interventions: Encourage patient to take pain medications as prescribed Provide education on pain management Reposition patient for comfort Treatment Activities: Administer pain control measures as ordered : 01/02/2022  0 Complex Wound Cleansing - multiple wounds X- 1 5 Wound Imaging (photographs - any number of wounds) '[]'$  - 0 Wound Tracing (instead of photographs) X- 1 5 Simple Wound Measurement - one wound '[]'$  - 0 Complex Wound Measurement - multiple wounds INTERVENTIONS - Wound Dressings X - Small Wound Dressing one or multiple wounds 1 10 '[]'$  - 0 Medium Wound Dressing one or multiple wounds '[]'$  - 0 Large Wound Dressing one or multiple wounds X- 1 5 Application of Medications - topical '[]'$  - 0 Application of Medications - injection INTERVENTIONS - Miscellaneous '[]'$  - 0 External ear exam '[]'$  - 0 Specimen Collection (cultures, biopsies, blood, body fluids, etc.) '[]'$  - 0 Specimen(s) / Culture(s) sent or taken to Lab for analysis '[]'$  - 0 Patient Transfer (multiple staff / [employer] Service fast streamer / Similar devices) '[]'$  - 0 Simple Staple / Suture removal (25 or less) '[]'$  - 0 Complex Staple / Suture removal (26 or more) '[]'$  - 0 Hypo / Hyperglycemic Management (close monitor of Blood Glucose) '[]'$  - 0 Ankle / Brachial Index (ABI) - do not check if billed separately X- 1 5 Vital Signs [name], [name] ([id number]) [id number] Page 3 of 7 Has the patient been seen at the hospital within the last three years: Yes Total Score: 100 Level Of Care: New/Established - Level 3 Electronic Signature(s) Signed: 01/25/2022 12:09:11 PM By: [name] Entered By: [name] on 01/23/2022 11:29:29 -------------------------------------------------------------------------------- Encounter Discharge Information Details Patient Name: Date of Service: [name] Cardenas. 01/23/2022 10:45 A M Medical Record Number: [id number] Patient Account Number: [id number] Date of Birth/Sex: Treating RN: 1950/12/05 (71 y.o. [name],  [name] Primary Care Kaiser Belluomini: Frederik Pear., RO BERT Other Clinician: Referring Norah Fick: Treating Lashaun Krapf/Extender: Gareth Morgan., RO BERT Weeks in Treatment: 3 Encounter Discharge Information Items Discharge Condition: Stable Ambulatory Status: Ambulatory Discharge Destination: Home Transportation: Private Auto Accompanied By: self Schedule Follow-up Appointment: Yes Clinical Summary of Care: Patient Declined Electronic Signature(s) Signed: 01/25/2022 12:09:11 PM By: [name] Entered By: [name] on 01/23/2022 11:29:55 -------------------------------------------------------------------------------- Lower Extremity Assessment Details Patient Name: Date of Service: Marissa Marissa Compton Cardenas. 01/23/2022 10:45 A M Medical Record Number: [id number] Patient Account Number: [id number] Date of Birth/Sex: Treating RN: 05/20/1950 (71 y.o. F) Primary Care Janalynn Eder: Frederik Pear., RO BERT Other Clinician: Referring Taro Hidrogo: Treating Hayat Warbington/Extender: Gareth Morgan., RO BERT Weeks in Treatment: 3 Electronic Signature(s) Signed: 01/23/2022 4:33:40 PM By: Erenest Blank Entered By: Erenest Blank on 01/23/2022 10:58:11 -------------------------------------------------------------------------------- Multi-Disciplinary Care Plan Details Patient Name: Date of Service: Marissa Marissa Compton Cardenas. 01/23/2022 10:45 A M Medical Record Number: [id number] Patient Account Number: [id number] Date of Birth/Sex: Treating RN: May 07, 1950 (71 y.o. [name], [name] Primary Care Yachet Mattson: Frederik Pear., RO BERT Other Clinician: Referring Trimaine Maser: Treating Ephriam Turman/Extender: Gareth Morgan., RO BERT [place], [place] Cardenas ([id number]) [phone].pdf Page 4 of 7 Weeks in Treatment: 3 Active Inactive Pain, Acute or Chronic Nursing Diagnoses: Pain, acute or chronic: actual or potential Potential alteration in comfort, pain Goals: Patient  will verbalize adequate pain control and receive pain control interventions during procedures as needed Date Initiated: 01/02/2022 Target Resolution Date: 03/09/2022 Goal Status: Active Patient/caregiver will verbalize adequate pain control between visits Date Initiated: 01/02/2022 Target Resolution Date: 03/09/2022 Goal Status: Active Interventions: Encourage patient to take pain medications as prescribed Provide education on pain management Reposition patient for comfort Treatment Activities: Administer pain control measures as ordered : 01/02/2022  Notes: Pressure Nursing Diagnoses: Knowledge deficit related to management of pressures ulcers Goals: Patient/caregiver will verbalize risk factors for pressure ulcer development Date Initiated: 01/02/2022 Target Resolution Date: 03/09/2022 Goal Status: Active Interventions: Assess: immobility, friction, shearing, incontinence upon admission and as needed Assess offloading mechanisms upon admission and as needed Provide education on pressure ulcers Treatment Activities: T ordered outside of clinic : 01/02/2022 est Notes: Electronic Signature(s) Signed: 01/25/2022 12:09:11 PM By: [name] Entered By: [name] on 01/23/2022 11:14:10 -------------------------------------------------------------------------------- Pain Assessment Details Patient Name: Date of Service: Marissa Marissa Compton Cardenas. 01/23/2022 10:45 A M Medical Record Number: [id number] Patient Account Number: [id number] Date of Birth/Sex: Treating RN: [birth year] (71 y.o. F) Primary Care Rafiq Bucklin: Frederik Pear., RO BERT Other Clinician: Referring Mishelle Hassan: Treating Augustino Savastano/Extender: Gareth Morgan., RO BERT Weeks in Treatment: 3 Active Problems Location of Pain Severity and Description of Pain Patient Has Paino No Site Locations [place], [place] Cardenas ([id number]) [id number] Page 5 of 7 Pain Management and  Medication Current Pain Management: Notes Has pain in ulcer at times. Electronic Signature(s) Signed: 01/23/2022 4:33:40 PM By: Erenest Blank Entered By: Erenest Blank on 01/23/2022 10:58:04 -------------------------------------------------------------------------------- Patient/Caregiver Education Details Patient Name: Date of Service: [name] 11/15/2023andnbsp10:45 A M Medical Record Number: [id number] Patient Account Number: [id number] Date of Birth/Gender: Treating RN: [birth year] (71 y.o. [name], [name] Primary Care Physician: Frederik Pear., RO BERT Other Clinician: Referring Physician: Treating Physician/Extender: Gareth Morgan., RO BERT Weeks in Treatment: 3 Education Assessment Education Provided To: Patient Education Topics Provided Pain: Methods: Explain/Verbal Responses: Reinforcements needed, State content correctly Pressure: Methods: Explain/Verbal Responses: Reinforcements needed, State content correctly Electronic Signature(s) Signed: 01/25/2022 12:09:11 PM By: [name] Entered By: [name] on 01/23/2022 11:14:26 [name] ([id number]) [id number] Page 6 of 7 -------------------------------------------------------------------------------- Wound Assessment Details Patient Name: Date of Service: [name] 01/23/2022 10:45 A M Medical Record Number: [id number] Patient Account Number: [id number] Date of Birth/Sex: Treating RN: April 13, 1950 (71 y.o. F) Primary Care Torres Hardenbrook: Frederik Pear., RO BERT Other Clinician: Referring Jaja Switalski: Treating Victoriya Pol/Extender: Gareth Morgan., RO BERT Weeks in Treatment: 3 Wound Status Wound Number: 1 Primary Etiology: Pressure Ulcer Wound Location: Sacrum Wound Status: Open Wounding Event: Pressure Injury Comorbid History: Colitis Date Acquired: 10/09/2021 Weeks Of Treatment: 3 Clustered Wound: No Photos Wound  Measurements Length: (cm) 0.5 Width: (cm) 0.4 Depth: (cm) 1 Area: (cm) 0.157 Volume: (cm) 0.157 % Reduction in Area: 55.5% % Reduction in Volume: 36.4% Epithelialization: None Tunneling: No Undermining: No Wound Description Classification: Category/Stage IV Wound Margin: Distinct, outline attached Exudate Amount: Medium Exudate Type: Serosanguineous Exudate Color: red, brown Foul Odor After Cleansing: No Slough/Fibrino Yes Wound Bed Granulation Amount: Medium (34-66%) Exposed Structure Granulation Quality: Pink, Pale Fascia Exposed: No Necrotic Amount: Medium (34-66%) Fat Layer (Subcutaneous Tissue) Exposed: Yes Necrotic Quality: Adherent Slough Tendon Exposed: No Muscle Exposed: Yes Necrosis of Muscle: Yes Joint Exposed: No Bone Exposed: No Periwound Skin Texture Texture Color No Abnormalities Noted: Yes No Abnormalities Noted: Yes Moisture Temperature / Pain No Abnormalities Noted: Yes Temperature: No Abnormality Treatment Notes Wound #1 (Sacrum) Cleanser Wound Cleanser Discharge Instruction: Cleanse the wound with wound cleanser prior to applying a clean dressing using gauze sponges, not tissue or cotton balls. Peri-Wound Care Skin Prep Discharge Instruction: Use skin prep as directed [name], [name] ([id number]) [phone].pdf Page 7 of 7 Topical Primary Dressing Hydrofera Blue Ready Foam, 2.5 x2.5 in

## 2022-01-25 NOTE — Progress Notes (Signed)
Noted: No No Abnormalities Noted: No Moisture No Abnormalities Noted: No Treatment Notes Wound #1 (Sacrum) Cleanser Wound Cleanser Discharge Instruction: Cleanse the wound with wound cleanser prior to applying a clean dressing using gauze sponges, not tissue or cotton balls. Peri-Wound Care Skin Prep Discharge Instruction: Use skin prep as directed Topical Primary Dressing Hydrofera Blue Ready Foam, 2.5 x2.5 in Discharge Instruction: Lightly pack into wound bed. Secondary Dressing Zetuvit Plus Silicone Border Dressing 4x4 (in/in) Discharge Instruction: Apply silicone border over primary dressing as directed. Secured With Compression Wrap Compression Stockings Environmental education officer) Signed: 01/25/2022 12:40:42 PM By: Loma Sender ([phone]) PM By: Erenest Blank [phone].pdf Page 5 of 5 Signed: 01/25/2022 12:40:42 Entered By: Erenest Blank on 01/25/2022 09:33:28 -------------------------------------------------------------------------------- Vitals Details Patient Name: Date of Service: [name]. 01/25/2022 9:30 A M Medical Record Number: [id number] Patient Account Number: [id number] Date of Birth/Sex: Treating RN: 11-14-1950 (71 y.o. F) Primary Care Alejandro Gamel: Frederik Pear., RO BERT Other Clinician: Referring Jenna Routzahn: Treating Jathan Balling/Extender: Mack Hook., RO  BERT Weeks in Treatment: 3 Vital Signs Time Taken: 09:33 Temperature (F): 98.6 Height (in): 64 Pulse (bpm): 81 Weight (lbs): 97 Respiratory Rate (breaths/min): 18 Body Mass Index (BMI): 16.6 Blood Pressure (mmHg): 122/61 Reference Range: 80 - 120 mg / dl Electronic Signature(s) Signed: 01/25/2022 12:40:42 PM By: Erenest Blank Entered By: Erenest Blank on 01/25/2022 09:33:18  Wound Cleansing / Measurement X - Simple Wound Cleansing - one wound 1 5 '[]'$  - 0 Complex Wound Cleansing - multiple wounds '[]'$  - 0 Wound Imaging (photographs - any number of wounds) '[]'$  - 0 Wound Tracing (instead of photographs) '[]'$  - 0 Simple Wound Measurement - one wound '[]'$  - 0 Complex Wound Measurement - multiple wounds INTERVENTIONS - Wound Dressings '[]'$  - 0 Small Wound Dressing one or multiple wounds X- 1 15 Medium Wound Dressing one or multiple wounds '[]'$  - 0 Large Wound Dressing one or multiple wounds '[]'$  - 0 Application of Medications - topical '[]'$  - 0 Application of Medications - injection INTERVENTIONS - Miscellaneous '[]'$  - 0 External ear exam '[]'$  - 0 Specimen Collection (cultures, biopsies, blood, body fluids, etc.) '[]'$  - 0 Specimen(s) / Culture(s) sent or taken to Lab for analysis '[]'$  - 0 Patient Transfer (multiple staff / Civil Service fast streamer / Similar devices) '[]'$  - 0 Simple Staple / Suture removal (25 or less) '[]'$  - 0 Complex Staple / Suture removal (26 or more) '[]'$  - 0 Hypo / Hyperglycemic Management (close monitor of Blood Glucose) [name], [name] ([id number]) [id number] Page 3 of 5 '[]'$  - 0 Ankle / Brachial Index (ABI) - do not check if billed separately X- 1 5 Vital Signs Has the patient been seen at the hospital within the last three years: Yes Total Score: 80 Level Of Care: New/Established - Level 3 Electronic Signature(s) Signed: 01/25/2022 12:40:42 PM By: Erenest Blank Entered By: Erenest Blank on 01/25/2022 12:26:55 -------------------------------------------------------------------------------- Encounter Discharge Information Details Patient Name: Date of Service: [name]. 01/25/2022 9:30 A M Medical Record Number: [id number] Patient Account Number:  [id number] Date of Birth/Sex: Treating RN: [birth year] (71 y.o. F) Primary Care Jaine Estabrooks: Frederik Pear., RO BERT Other Clinician: Erenest Blank Referring Elzy Tomasello: Treating Juniper Cobey/Extender: Mack Hook., RO BERT Weeks in Treatment: 3 Encounter Discharge Information Items Discharge Condition: Stable Ambulatory Status: Ambulatory Discharge Destination: Home Transportation: Private Auto Accompanied By: self Schedule Follow-up Appointment: Yes Clinical Summary of Care: Electronic Signature(s) Signed: 01/25/2022 12:40:42 PM By: Erenest Blank Entered By: Erenest Blank on 01/25/2022 12:39:54 -------------------------------------------------------------------------------- Patient/Caregiver Education Details Patient Name: Date of Service: [name] 11/17/2023andnbsp9:30 A M Medical Record Number: [id number] Patient Account Number: [id number] Date of Birth/Gender: Treating RN: [birth year] (71 y.o. F) Primary Care Physician: Frederik Pear., RO BERT Other Clinician: Erenest Blank Referring Physician: Treating Physician/Extender: Mack Hook., RO BERT Weeks in Treatment: 3 Education Assessment Education Provided To: Patient Education Topics Provided Electronic Signature(s) Signed: 01/25/2022 12:40:42 PM By: Erenest Blank Entered By: Erenest Blank on 01/25/2022 12:39:38 [name] ([id number]) [id number] Page 4 of 5 -------------------------------------------------------------------------------- Wound Assessment Details Patient Name: Date of Service: [name] 01/25/2022 9:30 A M Medical Record Number: [id number] Patient Account Number: [id number] Date of Birth/Sex: Treating RN: 1951/02/04 (71 y.o. F) Primary Care Ginevra Tacker: Frederik Pear., RO BERT Other Clinician: Referring Jahniya Duzan: Treating Haim Hansson/Extender: Mack Hook., RO BERT Weeks in Treatment: 3 Wound Status Wound Number:  1 Primary Etiology: Pressure Ulcer Wound Location: Sacrum Wound Status: Open Wounding Event: Pressure Injury Date Acquired: 10/09/2021 Weeks Of Treatment: 3 Clustered Wound: No Wound Measurements Length: (cm) 0.5 Width: (cm) 0.4 Depth: (cm) 1 Area: (cm) 0.157 Volume: (cm) 0.157 % Reduction in Area: 55.5% % Reduction in Volume: 36.4% Wound Description Classification: Category/Stage IV Exudate Amount: Medium Exudate Type: Serosanguineous Exudate Color: red, brown Periwound Skin Texture Texture Color No Abnormalities  Noted: No No Abnormalities Noted: No Moisture No Abnormalities Noted: No Treatment Notes Wound #1 (Sacrum) Cleanser Wound Cleanser Discharge Instruction: Cleanse the wound with wound cleanser prior to applying a clean dressing using gauze sponges, not tissue or cotton balls. Peri-Wound Care Skin Prep Discharge Instruction: Use skin prep as directed Topical Primary Dressing Hydrofera Blue Ready Foam, 2.5 x2.5 in Discharge Instruction: Lightly pack into wound bed. Secondary Dressing Zetuvit Plus Silicone Border Dressing 4x4 (in/in) Discharge Instruction: Apply silicone border over primary dressing as directed. Secured With Compression Wrap Compression Stockings Environmental education officer) Signed: 01/25/2022 12:40:42 PM By: Loma Sender ([phone]) PM By: Erenest Blank [phone].pdf Page 5 of 5 Signed: 01/25/2022 12:40:42 Entered By: Erenest Blank on 01/25/2022 09:33:28 -------------------------------------------------------------------------------- Vitals Details Patient Name: Date of Service: [name]. 01/25/2022 9:30 A M Medical Record Number: [id number] Patient Account Number: [id number] Date of Birth/Sex: Treating RN: 11-14-1950 (71 y.o. F) Primary Care Alejandro Gamel: Frederik Pear., RO BERT Other Clinician: Referring Jenna Routzahn: Treating Jathan Balling/Extender: Mack Hook., RO  BERT Weeks in Treatment: 3 Vital Signs Time Taken: 09:33 Temperature (F): 98.6 Height (in): 64 Pulse (bpm): 81 Weight (lbs): 97 Respiratory Rate (breaths/min): 18 Body Mass Index (BMI): 16.6 Blood Pressure (mmHg): 122/61 Reference Range: 80 - 120 mg / dl Electronic Signature(s) Signed: 01/25/2022 12:40:42 PM By: Erenest Blank Entered By: Erenest Blank on 01/25/2022 09:33:18

## 2022-01-28 ENCOUNTER — Encounter (HOSPITAL_BASED_OUTPATIENT_CLINIC_OR_DEPARTMENT_OTHER): Payer: Medicare Other | Admitting: Internal Medicine

## 2022-01-28 DIAGNOSIS — Z681 Body mass index (BMI) 19 or less, adult: Secondary | ICD-10-CM | POA: Diagnosis not present

## 2022-01-28 DIAGNOSIS — L89154 Pressure ulcer of sacral region, stage 4: Secondary | ICD-10-CM | POA: Diagnosis not present

## 2022-01-28 DIAGNOSIS — E43 Unspecified severe protein-calorie malnutrition: Secondary | ICD-10-CM | POA: Diagnosis not present

## 2022-01-28 DIAGNOSIS — M6281 Muscle weakness (generalized): Secondary | ICD-10-CM | POA: Diagnosis not present

## 2022-01-28 NOTE — Progress Notes (Signed)
NABRIA, NEVIN R ([id number]) [id number] Page 1 of 1 Visit Report for 01/25/2022 SuperBill Details Patient Name: Date of Service: [name] 01/25/2022 Medical Record Number: [id number] Patient Account Number: [id number] Date of Birth/Sex: Treating RN: 1950/10/01 (71 y.o. Female) Primary Care Provider: Frederik Pear., RO BERT Other Clinician: Referring Provider: Treating Provider/Extender: Mack Hook., RO BERT Weeks in Treatment: 3 Diagnosis Coding ICD-10 Codes Code Description M62.81 Muscle weakness (generalized) E43 Unspecified severe protein-calorie malnutrition L89.154 Pressure ulcer of sacral region, stage 4 Facility Procedures CPT4 Code Description Modifier Quantity 65465035 99213 - WOUND CARE VISIT-LEV 3 EST PT 1 Electronic Signature(s) Signed: 01/25/2022 12:40:42 PM By: Erenest Blank Signed: 01/28/2022 11:34:26 AM By: Kalman Shan DO Entered By: Erenest Blank on 01/25/2022 12:40:05

## 2022-01-28 NOTE — Progress Notes (Signed)
MIKAH, ROTTINGHAUS R ([id number]) [id number] Page 1 of 1 Visit Report for 01/28/2022 SuperBill Details Patient Name: Date of Service: [name] 01/28/2022 Medical Record Number: [id number] Patient Account Number: [id number] Date of Birth/Sex: Treating RN: 12-25-1950 (71 y.o. [name], [name] Primary Care Provider: [name], [name] Other Clinician: Referring Provider: Treating Provider/Extender: Genice Rouge., [name] in Treatment: 3 Diagnosis Coding ICD-10 Codes Code Description [phone] Muscle weakness (generalized) E43 Unspecified severe protein-calorie malnutrition L89.154 Pressure ulcer of sacral region, stage 4 Facility Procedures CPT4 Code Description Modifier Quantity [id number] 99213 - WOUND CARE VISIT-LEV 3 EST PT 1 Electronic Signature(s) Signed: 01/28/2022 11:34:26 AM By: Kalman Shan DO Signed: 01/28/2022 4:27:51 PM By: Deon Pilling RN, BSN Entered By: Deon Pilling on 01/28/2022 08:42:01

## 2022-01-28 NOTE — Progress Notes (Signed)
OPHELIA, QUILTY R ([id number]) [id number] Page 1 of 4 Visit Report for 01/28/2022 Arrival Information Details Patient Name: Date of Service: [name] 01/28/2022 8:30 A M Medical Record Number: [id number] Patient Account Number: [id number] Date of Birth/Sex: Treating RN: [birth year] (71 y.o. [name], [name] Primary Care Carla Rashad: Jayme Cloud., [name] Other Clinician: Referring Koven Belinsky: Treating Jayden Rudge/Extender: Allena Katz., [name] in Treatment: 3 Visit Information History Since Last Visit Added or deleted any medications: No Patient Arrived: Ambulatory Any new allergies or adverse reactions: No Arrival Time: 08:40 Had a fall or experienced change in No Accompanied By: self activities of daily living that may affect Transfer Assistance: None risk of falls: Patient Identification Verified: Yes Signs or symptoms of abuse/neglect since last visito No Secondary Verification Process Completed: Yes Hospitalized since last visit: No Patient Requires Transmission-Based Precautions: No Implantable device outside of the clinic excluding No Patient Has Alerts: No cellular tissue based products placed in the center since last visit: Has Dressing in Place as Prescribed: No Pain Present Now: No Electronic Signature(s) Signed: 01/28/2022 4:27:51 PM By: Shawn Stall RN, BSN Entered By: Shawn Stall on 01/28/2022 08:40:55 -------------------------------------------------------------------------------- Clinic Level of Care Assessment Details Patient Name: Date of Service: [name] 01/28/2022 8:30 A M Medical Record Number: [id number] Patient Account Number: [id number] Date of Birth/Sex: Treating RN: 1951/01/25 (71 y.o. [name], [name] Primary Care Oralia Criger: Jayme Cloud., [name] Other Clinician: Referring Saray Capasso: Treating Cherryl Babin/Extender: Allena Katz., [name] in Treatment: 3 Clinic Level of Care  Assessment Items TOOL 4 Quantity Score X- 1 0 Use when only an EandM is performed on FOLLOW-UP visit ASSESSMENTS - Nursing Assessment / Reassessment X- 1 10 Reassessment of Co-morbidities (includes updates in patient status) X- 1 5 Reassessment of Adherence to Treatment Plan ASSESSMENTS - Wound and Skin A ssessment / Reassessment X - Simple Wound Assessment / Reassessment - one wound 1 5 []  - 0 Complex Wound Assessment / Reassessment - multiple wounds []  - 0 Dermatologic / Skin Assessment (not related to wound area) ASSESSMENTS - Focused Assessment []  - 0 Circumferential Edema Measurements - multi extremities []  - 0 Nutritional Assessment / Counseling / Intervention LATICA, PATRONE R ([id number]) [id number] Page 2 of 4 []  - 0 Lower Extremity Assessment (monofilament, tuning fork, pulses) []  - 0 Peripheral Arterial Disease Assessment (using hand held doppler) ASSESSMENTS - Ostomy and/or Continence Assessment and Care []  - 0 Incontinence Assessment and Management []  - 0 Ostomy Care Assessment and Management (repouching, etc.) PROCESS - Coordination of Care X - Simple Patient / Family Education for ongoing care 1 15 []  - 0 Complex (extensive) Patient / Family Education for ongoing care X- 1 10 Staff obtains Chiropractor, Records, T Results / Process Orders est []  - 0 Staff telephones HHA, Nursing Homes / Clarify orders / etc []  - 0 Routine Transfer to another Facility (non-emergent condition) []  - 0 Routine Hospital Admission (non-emergent condition) []  - 0 New Admissions / Manufacturing engineer / Ordering NPWT Apligraf, etc. , []  - 0 Emergency Hospital Admission (emergent condition) X- 1 10 Simple Discharge Coordination []  - 0 Complex (extensive) Discharge Coordination PROCESS - Special Needs []  - 0 Pediatric / Minor Patient Management []  - 0 Isolation Patient Management []  - 0 Hearing / Language / Visual special needs []  -  0 Assessment of Community assistance (transportation, D/C planning, etc.) []  - 0 Additional assistance / Altered mentation []  - 0 Support Surface(s) Assessment (bed, cushion, seat, etc.) INTERVENTIONS - Wound  Cleansing / Measurement X - Simple Wound Cleansing - one wound 1 5 []  - 0 Complex Wound Cleansing - multiple wounds X- 1 5 Wound Imaging (photographs - any number of wounds) []  - 0 Wound Tracing (instead of photographs) X- 1 5 Simple Wound Measurement - one wound []  - 0 Complex Wound Measurement - multiple wounds INTERVENTIONS - Wound Dressings X - Small Wound Dressing one or multiple wounds 1 10 []  - 0 Medium Wound Dressing one or multiple wounds []  - 0 Large Wound Dressing one or multiple wounds []  - 0 Application of Medications - topical []  - 0 Application of Medications - injection INTERVENTIONS - Miscellaneous []  - 0 External ear exam []  - 0 Specimen Collection (cultures, biopsies, blood, body fluids, etc.) []  - 0 Specimen(s) / Culture(s) sent or taken to Lab for analysis []  - 0 Patient Transfer (multiple staff / Nurse, adult / Similar devices) []  - 0 Simple Staple / Suture removal (25 or less) []  - 0 Complex Staple / Suture removal (26 or more) []  - 0 Hypo / Hyperglycemic Management (close monitor of Blood Glucose) JAKAYAH, TIMBROOK R ([id number]) [id number] Page 3 of 4 []  - 0 Ankle / Brachial Index (ABI) - do not check if billed separately []  - 0 Vital Signs Has the patient been seen at the hospital within the last three years: Yes Total Score: 80 Level Of Care: New/Established - Level 3 Electronic Signature(s) Signed: 01/28/2022 4:27:51 PM By: Shawn Stall RN, BSN Entered By: Shawn Stall on 01/28/2022 08:41:55 -------------------------------------------------------------------------------- Encounter Discharge Information Details Patient Name: Date of Service: [name] R. 01/28/2022 8:30 A M Medical Record Number:  [id number] Patient Account Number: [id number] Date of Birth/Sex: Treating RN: Oct 20, 1950 (71 y.o. [name], [name] Primary Care Berman Grainger: Jayme Cloud., [name] Other Clinician: Referring Anjelika Ausburn: Treating Mariellen Blaney/Extender: Allena Katz., [name] in Treatment: 3 Encounter Discharge Information Items Discharge Condition: Stable Ambulatory Status: Ambulatory Discharge Destination: Home Transportation: Private Auto Accompanied By: self Schedule Follow-up Appointment: Yes Clinical Summary of Care: Electronic Signature(s) Signed: 01/28/2022 4:27:51 PM By: Shawn Stall RN, BSN Entered By: Shawn Stall on 01/28/2022 08:41:31 -------------------------------------------------------------------------------- Wound Assessment Details Patient Name: Date of Service: [name] R. 01/28/2022 8:30 A M Medical Record Number: [id number] Patient Account Number: [id number] Date of Birth/Sex: Treating RN: [birth year] (71 y.o. [name], [name] Primary Care Lamonica Trueba: Jayme Cloud., [name] Other Clinician: Referring Zadiel Leyh: Treating Dianara Smullen/Extender: Allena Katz., [name] in Treatment: 3 Wound Status Wound Number: 1 Primary Etiology: Pressure Ulcer Wound Location: Sacrum Wound Status: Open Wounding Event: Pressure Injury Date Acquired: 10/09/2021 Weeks Of Treatment: 3 Clustered Wound: No Wound Measurements Length: (cm) 0.5 Width: (cm) 0.4 Depth: (cm) 1 Area: (cm) 0.157 Volume: (cm) 0.157 % Reduction in Area: 55.5% % Reduction in Volume: 36.4% Wound Description HELENMARIE, GUILE R ([id number]) Classification: Category/Stage IV Exudate Amount: Medium Exudate Type: Serosanguineous Exudate Color: red, brown [id number] Page 4 of 4 Periwound Skin Texture Texture Color No Abnormalities Noted: No No Abnormalities Noted: No Moisture No Abnormalities Noted: No Treatment Notes Wound #1 (Sacrum) Cleanser Wound  Cleanser Discharge Instruction: Cleanse the wound with wound cleanser prior to applying a clean dressing using gauze sponges, not tissue or cotton balls. Peri-Wound Care Skin Prep Discharge Instruction: Use skin prep as directed Topical Primary Dressing Hydrofera Blue Ready Foam, 2.5 x2.5 in Discharge Instruction: Lightly pack into wound bed. Secondary Dressing Zetuvit Plus Silicone Border Dressing 4x4 (in/in) Discharge Instruction: Apply silicone border over primary dressing as directed.  Secured With Compression Wrap Compression Stockings Facilities manager) Signed: 01/28/2022 4:27:51 PM By: Shawn Stall RN, BSN Entered By: Shawn Stall on 01/28/2022 08:41:09

## 2022-01-30 ENCOUNTER — Encounter (HOSPITAL_BASED_OUTPATIENT_CLINIC_OR_DEPARTMENT_OTHER): Payer: Medicare Other | Admitting: Physician Assistant

## 2022-01-30 DIAGNOSIS — L98492 Non-pressure chronic ulcer of skin of other sites with fat layer exposed: Secondary | ICD-10-CM | POA: Diagnosis not present

## 2022-01-30 DIAGNOSIS — Z681 Body mass index (BMI) 19 or less, adult: Secondary | ICD-10-CM | POA: Diagnosis not present

## 2022-01-30 DIAGNOSIS — M6281 Muscle weakness (generalized): Secondary | ICD-10-CM | POA: Diagnosis not present

## 2022-01-30 DIAGNOSIS — L89154 Pressure ulcer of sacral region, stage 4: Secondary | ICD-10-CM | POA: Diagnosis not present

## 2022-01-30 DIAGNOSIS — E43 Unspecified severe protein-calorie malnutrition: Secondary | ICD-10-CM | POA: Diagnosis not present

## 2022-01-30 NOTE — Progress Notes (Signed)
Marissa, BARDSLEY R ([id number]) [id number] Page 1 of 6 Visit Report for 01/30/2022 Chief Complaint Document Details Patient Name: Date of Service: [name]. 01/30/2022 1:30 PM Medical Record Number: [id number] Patient Account Number: [id number] Date of Birth/Sex: Treating RN: 10-23-1950 (71 y.o. F) Primary Care Provider: [name], [name] Other Clinician: Referring Provider: Treating Provider/Extender: [name] in Treatment: 4 Information Obtained from: Patient Chief Complaint Pressure ulcer stage 3 Sacrum Electronic Signature(s) Signed: 01/30/2022 1:58:36 PM By: Worthy Keeler PA-C Entered By: Worthy Keeler on 01/30/2022 13:58:36 -------------------------------------------------------------------------------- HPI Details Patient Name: Date of Service: CO Marissa Compton R. 01/30/2022 1:30 PM Medical Record Number: [id number] Patient Account Number: [id number] Date of Birth/Sex: Treating RN: [birth year] (71 y.o. F) Primary Care Provider: [name], [name] Other Clinician: Referring Provider: Treating Provider/Extender: [name] in Treatment: 4 History of Present Illness HPI Description: 01-02-22 upon evaluation today patient presents for initial inspection here in our clinic concerning a wound that she has over the sacral region. This is stated to be present since around the beginning of August 2023. She currently resides in a assisted nursing facility. She does seem to be able to answer questions fully today. Subsequently I do note that she is a little on the thin side but again other than the protein calorie malnutrition she is minimally weak but still does get up and move around some but she also tells me that she "sits a lot". She has not had any x-rays of the sacral region at this point. 01-09-2022 upon evaluation today patient's wound in the sacral area actually appears to be  doing decently well. Fortunately I do not see any signs of infection which is great news and overall I am extremely pleased with where things stand today. 11/8; this is a patient who lives in some form of small assisted living in [place]. She has a deep wound over the lower part of her coccyx. An x-ray that we ordered from last time did not show any osseous abnormalities. We are supposed to be using Hydrofera Blue in the wound but I am really not sure what they are using to dress this and who is doing it. Talking to our staff they have apparently discussed this with the staff in the facility. She does not have home health. 01-23-2022 upon evaluation today patient appears to be doing decently well in regard to her wound. She has been tolerating the dressing changes without complication although I am not certain the dressing changes have been done appropriately over the past couple of weeks. We have been trying to get her to come in here we also try to get her into a wound care center in [place] which would be closer for the assisted living facility. Unfortunately neither 1 of those were undertaken up to this point. The patient did end up having some training of the staff from an RN that they brought him to have the staff perform the dressing changes but that being said it does not sound like this has been done correctly. Nonetheless I do believe that based on what we see it would benefit the patient to actually have her come here 3 times a week also think a wound VAC could potentially be a possibility for her which would help to get things moving in a much better direction as far as healing is concerned. We need to get this to fill-in though it looks clean and  does not look infected I do think that we need to really get this to fill-in and there is quite a bit of undermining that is to be considered here. 01-30-2022 upon evaluation today patient appears to be doing well currently in regard to her wound  which is looking pretty decent but still has quite a bit of space underneath as far as undermining is concerned. I think she might possibly be better with a wound VAC. With that being said if when I do this we probably only be able to do it 2 times a week at most. I discussed that with the patient today she is okay with that we just need to see if we can get the insurance approval and then of course the scheduling underway. Electronic Signature(s) Signed: 01/30/2022 2:09:33 PM By: Irean Hong [place], [place] R ([id number]) PM By: Irean Hong [phone].pdf Page 2 of 6 Signed: 01/30/2022 2:09:33 Previous Signature: 01/30/2022 2:09:22 PM Version By: Worthy Keeler PA-C Entered By: Worthy Keeler on 01/30/2022 14:09:33 -------------------------------------------------------------------------------- Physical Exam Details Patient Name: Date of Service: Marissa Perking R. 01/30/2022 1:30 PM Medical Record Number: [id number] Patient Account Number: [id number] Date of Birth/Sex: Treating RN: Jan 03, 1951 (71 y.o. F) Primary Care Provider: [name], [name] Other Clinician: Referring Provider: Treating Provider/Extender: [name] in Treatment: 4 Constitutional Well-nourished and well-hydrated in no acute distress. Respiratory normal breathing without difficulty. Psychiatric this patient is able to make decisions and demonstrates good insight into disease process. Alert and Oriented x 3. pleasant and cooperative. Notes Upon inspection patient's wound bed showed signs of good granulation and epithelization at this point. Fortunately there does not appear to be any evidence of active infection locally or systemically which is great news and overall very pleased with where we stand although I do feel like this is going to take quite a while to fill-in and I really feel like a wound VAC would probably do better for  her. Electronic Signature(s) Signed: 01/30/2022 2:10:02 PM By: Worthy Keeler PA-C Entered By: Worthy Keeler on 01/30/2022 14:10:02 -------------------------------------------------------------------------------- Physician Orders Details Patient Name: Date of Service: CO Marissa Compton R. 01/30/2022 1:30 PM Medical Record Number: [id number] Patient Account Number: [id number] Date of Birth/Sex: Treating RN: Nov 24, 1950 (71 y.o. [name], [name] Primary Care Provider: [name], [name] Other Clinician: Referring Provider: Treating Provider/Extender: [name] in Treatment: 4 Verbal / Phone Orders: No Diagnosis Coding Follow-up Appointments ppointment in 1 week. Burman Blacksmith, [place] Return A Nurse Visit: - Monday's and Friday's Other: - Administrator Arbutus Ped) : [phone] Anesthetic (In clinic) Topical Lidocaine 5% applied to wound bed Bathing/ Shower/ Hygiene May shower with protection but do not get wound dressing(s) wet. - If dressing gets wet, please change right away as it can lead to infection/worsening of wound if not changed. ***Patient to only shower on days dressing gets changed (MOnday,Wednesday,Friday). She should shower just before dressing changes and immediately have dressing put back on after shower is done*** Off-Loading Turn and reposition every 2 hours SHALONDA, SACHSE R ([id number]) [id number].pdf Page 3 of 6 Wound Treatment Wound #1 - Sacrum Cleanser: Wound Cleanser (Home Health) 3 x Per Week/30 Days Discharge Instructions: Cleanse the wound with wound cleanser prior to applying a clean dressing using gauze sponges, not tissue or cotton balls. Peri-Wound Care: Skin Prep (Home Health) 3 x Per Week/30 Days Discharge Instructions: Use skin prep as directed Prim Dressing:  Hydrofera Blue Ready Foam, 2.5 x2.5 in (Home Health) 3 x Per Week/30 Days ary Discharge Instructions: Lightly pack into wound  bed. Secondary Dressing: Zetuvit Plus Silicone Border Dressing 4x4 (in/in) (Home Health) 3 x Per Week/30 Days Discharge Instructions: Apply silicone border over primary dressing as directed. Electronic Signature(s) Signed: 01/30/2022 2:30:20 PM By: Worthy Keeler PA-C Signed: 01/30/2022 3:58:15 PM By: [name] Entered By: [name] on 01/30/2022 14:09:51 -------------------------------------------------------------------------------- Problem List Details Patient Name: Date of Service: CO Marissa Compton R. 01/30/2022 1:30 PM Medical Record Number: [id number] Patient Account Number: [id number] Date of Birth/Sex: Treating RN: 1951-02-17 (71 y.o. F) Primary Care Provider: [name], [name] Other Clinician: Referring Provider: Treating Provider/Extender: [name] in Treatment: 4 Active Problems ICD-10 Encounter Code Description Active Date MDM Diagnosis M62.81 Muscle weakness (generalized) 01/02/2022 No Yes E43 Unspecified severe protein-calorie malnutrition 01/02/2022 No Yes L89.154 Pressure ulcer of sacral region, stage 4 01/02/2022 No Yes Inactive Problems Resolved Problems Electronic Signature(s) Signed: 01/30/2022 1:58:29 PM By: Worthy Keeler PA-C Entered By: Worthy Keeler on 01/30/2022 13:58:29 Progress Note Details -------------------------------------------------------------------------------- [name] ([id number]) [id number] Page 4 of 6 Patient Name: Date of Service: [name] 01/30/2022 1:30 PM Medical Record Number: [id number] Patient Account Number: [id number] Date of Birth/Sex: Treating RN: [birth year] (71 y.o. F) Primary Care Provider: [name], [name] Other Clinician: Referring Provider: Treating Provider/Extender: [name] in Treatment: 4 Subjective Chief Complaint Information obtained from Patient Pressure ulcer stage 3  Sacrum History of Present Illness (HPI) 01-02-22 upon evaluation today patient presents for initial inspection here in our clinic concerning a wound that she has over the sacral region. This is stated to be present since around the beginning of August 2023. She currently resides in a assisted nursing facility. She does seem to be able to answer questions fully today. Subsequently I do note that she is a little on the thin side but again other than the protein calorie malnutrition she is minimally weak but still does get up and move around some but she also tells me that she "sits a lot". She has not had any x-rays of the sacral region at this point. 01-09-2022 upon evaluation today patient's wound in the sacral area actually appears to be doing decently well. Fortunately I do not see any signs of infection which is great news and overall I am extremely pleased with where things stand today. 11/8; this is a patient who lives in some form of small assisted living in [place]. She has a deep wound over the lower part of her coccyx. An x-ray that we ordered from last time did not show any osseous abnormalities. We are supposed to be using Hydrofera Blue in the wound but I am really not sure what they are using to dress this and who is doing it. Talking to our staff they have apparently discussed this with the staff in the facility. She does not have home health. 01-23-2022 upon evaluation today patient appears to be doing decently well in regard to her wound. She has been tolerating the dressing changes without complication although I am not certain the dressing changes have been done appropriately over the past couple of weeks. We have been trying to get her to come in here we also try to get her into a wound care center in [place] which would be closer for the assisted living facility. Unfortunately neither 1 of those were  undertaken up to this point. The patient did end up having some training of the  staff from an RN that they brought him to have the staff perform the dressing changes but that being said it does not sound like this has been done correctly. Nonetheless I do believe that based on what we see it would benefit the patient to actually have her come here 3 times a week also think a wound VAC could potentially be a possibility for her which would help to get things moving in a much better direction as far as healing is concerned. We need to get this to fill-in though it looks clean and does not look infected I do think that we need to really get this to fill-in and there is quite a bit of undermining that is to be considered here. 01-30-2022 upon evaluation today patient appears to be doing well currently in regard to her wound which is looking pretty decent but still has quite a bit of space underneath as far as undermining is concerned. I think she might possibly be better with a wound VAC. With that being said if when I do this we probably only be able to do it 2 times a week at most. I discussed that with the patient today she is okay with that we just need to see if we can get the insurance approval and then of course the scheduling underway. Objective Constitutional Well-nourished and well-hydrated in no acute distress. Vitals Time Taken: 1:49 PM, Height: 64 in, Weight: 97 lbs, BMI: 16.6, Temperature: 98.6 F, Pulse: 79 bpm, Respiratory Rate: 20 breaths/min, Blood Pressure: 112/72 mmHg. Respiratory normal breathing without difficulty. Psychiatric this patient is able to make decisions and demonstrates good insight into disease process. Alert and Oriented x 3. pleasant and cooperative. General Notes: Upon inspection patient's wound bed showed signs of good granulation and epithelization at this point. Fortunately there does not appear to be any evidence of active infection locally or systemically which is great news and overall very pleased with where we stand although I do  feel like this is going to take quite a while to fill-in and I really feel like a wound VAC would probably do better for her. Integumentary (Hair, Skin) Wound #1 status is Open. Original cause of wound was Pressure Injury. The date acquired was: 10/09/2021. The wound has been in treatment 4 weeks. The wound is located on the Sacrum. The wound measures 0.9cm length x 0.3cm width x 1.3cm depth; 0.212cm^2 area and 0.276cm^3 volume. There is Fat Layer (Subcutaneous Tissue) exposed. There is no tunneling noted, however, there is undermining starting at 12:00 and ending at 12:00 with a maximum distance of 2.9cm. There is a medium amount of serosanguineous drainage noted. Foul odor after cleansing was noted. The wound margin is epibole. There is large (67- 100%) red, pink granulation within the wound bed. There is no necrotic tissue within the wound bed. The periwound skin appearance did not exhibit: Callus, Crepitus, Excoriation, Induration, Rash, Scarring, Dry/Scaly, Maceration, Atrophie Blanche, Cyanosis, Ecchymosis, Hemosiderin Staining, Mottled, Pallor, Rubor, Erythema. Assessment Active Problems ICD-10 SABRINE, PATCHEN R ([id number]) [id number] Page 5 of 6 Muscle weakness (generalized) Unspecified severe protein-calorie malnutrition Pressure ulcer of sacral region, stage 4 Plan Follow-up Appointments: Return Appointment in 1 week. Burman Blacksmith, [place] Nurse Visit: - DGLOVF'I and Friday's Other: - Administrator Arbutus Ped) : [phone] Anesthetic: (In clinic) Topical Lidocaine 5% applied to wound bed Bathing/ Shower/ Hygiene: May shower with protection but do not  get wound dressing(s) wet. - If dressing gets wet, please change right away as it can lead to infection/worsening of wound if not changed. ***Patient to only shower on days dressing gets changed (MOnday,Wednesday,Friday). She should shower just before dressing changes and immediately have dressing put  back on after shower is done*** Off-Loading: Turn and reposition every 2 hours WOUND #1: - Sacrum Wound Laterality: Cleanser: Wound Cleanser (Home Health) 3 x Per Week/30 Days Discharge Instructions: Cleanse the wound with wound cleanser prior to applying a clean dressing using gauze sponges, not tissue or cotton balls. Peri-Wound Care: Skin Prep (Home Health) 3 x Per Week/30 Days Discharge Instructions: Use skin prep as directed Prim Dressing: Hydrofera Blue Ready Foam, 2.5 x2.5 in (Home Health) 3 x Per Week/30 Days ary Discharge Instructions: Lightly pack into wound bed. Secondary Dressing: Zetuvit Plus Silicone Border Dressing 4x4 (in/in) (Home Health) 3 x Per Week/30 Days Discharge Instructions: Apply silicone border over primary dressing as directed. 1. Based on what I see I do believe that the patient would benefit from a wound VAC I believe that possibly using white foam would be the ideal scenario here. 2. I do think we do this however really need to switch her to doing like Monday Thursday visits so that we can actually have time to put the wound VAC on and that means Dr. Heber Pioneer would also have to agree to take over her care going forward. 3. I am also going to suggest that we had the patient continue with appropriate offloading I think that still ideal for now using [facility] which I feel like is still probably the best option at this point. We will see patient back for reevaluation in 1 week here in the clinic. If anything worsens or changes patient will contact our office for additional recommendations. Electronic Signature(s) Signed: 01/30/2022 2:10:52 PM By: Worthy Keeler PA-C Entered By: Worthy Keeler on 01/30/2022 14:10:51 -------------------------------------------------------------------------------- SuperBill Details Patient Name: Date of Service: CO Marissa Compton R. 01/30/2022 Medical Record Number: [id number] Patient Account Number: [id number] Date of  Birth/Sex: Treating RN: [birth year] (71 y.o. [name], [name] Primary Care Provider: [name], [name] Other Clinician: Referring Provider: Treating Provider/Extender: [name] in Treatment: 4 Diagnosis Coding ICD-10 Codes Code Description [id number] Muscle weakness (generalized) E43 Unspecified severe protein-calorie malnutrition L89.154 Pressure ulcer of sacral region, stage 4 Facility Procedures Physician Procedures : CPT4 Code Description Modifier [id number] - WC PHYS LEVEL 3 - EST PT ICD-10 Diagnosis Description M62.81 Muscle weakness (generalized) E43 Unspecified severe protein-calorie malnutrition L89.154 Pressure ulcer of sacral region, stage 4 Quantity: 1 Electronic Signature(s) Signed: 01/30/2022 2:11:00 PM By: Worthy Keeler PA-C Entered By: Worthy Keeler on 01/30/2022 14:11:00

## 2022-01-31 NOTE — Progress Notes (Signed)
Measurement X - Simple Wound Cleansing - one wound 1 5 '[]'$  - 0 Complex Wound Cleansing - multiple wounds X- 1 5 Wound Imaging (photographs - any number of wounds) '[]'$  - 0 Wound Tracing (instead of photographs) X- 1 5 Simple Wound Measurement - one wound '[]'$  - 0 Complex Wound Measurement - multiple wounds INTERVENTIONS - Wound Dressings X - Small Wound Dressing one or multiple wounds 1 10 '[]'$  - 0 Medium Wound Dressing one or multiple wounds '[]'$  - 0 Large Wound Dressing one or multiple wounds '[]'$  - 0 Application of Medications - topical '[]'$  - 0 Application of Medications - injection INTERVENTIONS - Miscellaneous '[]'$  - 0 External ear exam '[]'$  - 0 Specimen Collection (cultures, biopsies, blood, body fluids, etc.) '[]'$  - 0 Specimen(s) / Culture(s) sent or taken to Lab for analysis '[]'$  - 0 Patient Transfer (multiple staff / [employer] Service fast streamer / Similar devices) '[]'$  - 0 Simple Staple / Suture removal (25 or less) '[]'$  - 0 Complex Staple / Suture removal (26 or more) '[]'$  - 0 Hypo / Hyperglycemic Management (close monitor of Blood Glucose) [name], [name] ([id number]) [id number] Page 3 of 7 '[]'$  - 0 Ankle / Brachial Index (ABI) - do not check if billed separately X- 1 5 Vital Signs Has the patient been seen at the hospital within the last three years: Yes Total Score: 80 Level Of Care: New/Established - Level 3 Electronic Signature(s) Signed: 01/30/2022 3:58:15 PM By: [name] Entered By: [name] on 01/30/2022 14:00:44 -------------------------------------------------------------------------------- Encounter Discharge Information Details Patient Name: Date of Service: Marissa Perking Cardenas. 01/30/2022 1:30 PM Medical Record  Number: [id number] Patient Account Number: [id number] Date of Birth/Sex: Treating RN: December 10, 1950 (71 y.o. [name], [name] Primary Care Abriella Filkins: [name], [name] Other Clinician: Referring Rondy Krupinski: Treating Catherine Cubero/Extender: [name] in Treatment: 4 Encounter Discharge Information Items Discharge Condition: Stable Ambulatory Status: Ambulatory Discharge Destination: Home Transportation: Private Auto Accompanied By: self Schedule Follow-up Appointment: Yes Clinical Summary of Care: Patient Declined Electronic Signature(s) Signed: 01/30/2022 3:58:15 PM By: [name] Entered By: [name] on 01/30/2022 14:25:35 -------------------------------------------------------------------------------- Lower Extremity Assessment Details Patient Name: Date of Service: Marissa [name]. 01/30/2022 1:30 PM Medical Record Number: [id number] Patient Account Number: [id number] Date of Birth/Sex: Treating RN: 09-Oct-1950 (71 y.o. [name], [name] Primary Care Sanskriti Greenlaw: [name], [name] Other Clinician: Referring Andelyn Spade: Treating Krystle Oberman/Extender: [name] in Treatment: 4 Electronic Signature(s) Signed: 01/30/2022 4:43:23 PM By: Deon Pilling RN, BSN Entered By: Deon Pilling on 01/30/2022 13:49:55 -------------------------------------------------------------------------------- [place] Details Patient Name: Date of Service: Marissa Perking Cardenas. 01/30/2022 1:30 PM Medical Record Number: [id number] Patient Account Number: [id number] [name], [name] ([id number]) [id number].pdf Page 4 of 7 Date of Birth/Sex: Treating RN: 05/06/1950 (71 y.o. [name], [name] Primary Care Reeta Kuk: Other Clinician: [name], [name] Referring Addalynne Golding: Treating Joshua Zeringue/Extender: [name] in Treatment: 4 Active Inactive Pain, Acute or  Chronic Nursing Diagnoses: Pain, acute or chronic: actual or potential Potential alteration in comfort, pain Goals: Patient will verbalize adequate pain control and receive pain control interventions during procedures as needed Date Initiated: 01/02/2022 Target Resolution Date: 03/09/2022 Goal Status: Active Patient/caregiver will verbalize adequate pain control between visits Date Initiated: 01/02/2022 Target Resolution Date: 03/09/2022 Goal Status: Active Interventions: Encourage patient to take pain medications as prescribed Provide education on pain management Reposition patient for comfort Treatment Activities: Administer pain control measures as ordered :  Measurement X - Simple Wound Cleansing - one wound 1 5 '[]'$  - 0 Complex Wound Cleansing - multiple wounds X- 1 5 Wound Imaging (photographs - any number of wounds) '[]'$  - 0 Wound Tracing (instead of photographs) X- 1 5 Simple Wound Measurement - one wound '[]'$  - 0 Complex Wound Measurement - multiple wounds INTERVENTIONS - Wound Dressings X - Small Wound Dressing one or multiple wounds 1 10 '[]'$  - 0 Medium Wound Dressing one or multiple wounds '[]'$  - 0 Large Wound Dressing one or multiple wounds '[]'$  - 0 Application of Medications - topical '[]'$  - 0 Application of Medications - injection INTERVENTIONS - Miscellaneous '[]'$  - 0 External ear exam '[]'$  - 0 Specimen Collection (cultures, biopsies, blood, body fluids, etc.) '[]'$  - 0 Specimen(s) / Culture(s) sent or taken to Lab for analysis '[]'$  - 0 Patient Transfer (multiple staff / Civil Service fast streamer / Similar devices) '[]'$  - 0 Simple Staple / Suture removal (25 or less) '[]'$  - 0 Complex Staple / Suture removal (26 or more) '[]'$  - 0 Hypo / Hyperglycemic Management (close monitor of Blood Glucose) [name], [name] ([id number]) [id number] Page 3 of 7 '[]'$  - 0 Ankle / Brachial Index (ABI) - do not check if billed separately X- 1 5 Vital Signs Has the patient been seen at the hospital within the last three years: Yes Total Score: 80 Level Of Care: New/Established - Level 3 Electronic Signature(s) Signed: 01/30/2022 3:58:15 PM By: [name] Entered By: [name] on 01/30/2022 14:00:44 -------------------------------------------------------------------------------- Encounter Discharge Information Details Patient Name: Date of Service: Marissa Perking Cardenas. 01/30/2022 1:30 PM Medical Record  Number: [id number] Patient Account Number: [id number] Date of Birth/Sex: Treating RN: December 10, 1950 (71 y.o. [name], [name] Primary Care Abriella Filkins: [name], [name] Other Clinician: Referring Rondy Krupinski: Treating Catherine Cubero/Extender: [name] in Treatment: 4 Encounter Discharge Information Items Discharge Condition: Stable Ambulatory Status: Ambulatory Discharge Destination: Home Transportation: Private Auto Accompanied By: self Schedule Follow-up Appointment: Yes Clinical Summary of Care: Patient Declined Electronic Signature(s) Signed: 01/30/2022 3:58:15 PM By: [name] Entered By: [name] on 01/30/2022 14:25:35 -------------------------------------------------------------------------------- Lower Extremity Assessment Details Patient Name: Date of Service: Marissa [name]. 01/30/2022 1:30 PM Medical Record Number: [id number] Patient Account Number: [id number] Date of Birth/Sex: Treating RN: 09-Oct-1950 (71 y.o. [name], [name] Primary Care Sanskriti Greenlaw: [name], [name] Other Clinician: Referring Andelyn Spade: Treating Krystle Oberman/Extender: [name] in Treatment: 4 Electronic Signature(s) Signed: 01/30/2022 4:43:23 PM By: Deon Pilling RN, BSN Entered By: Deon Pilling on 01/30/2022 13:49:55 -------------------------------------------------------------------------------- [place] Details Patient Name: Date of Service: Marissa Perking Cardenas. 01/30/2022 1:30 PM Medical Record Number: [id number] Patient Account Number: [id number] [name], [name] ([id number]) [id number].pdf Page 4 of 7 Date of Birth/Sex: Treating RN: 05/06/1950 (71 y.o. [name], [name] Primary Care Reeta Kuk: Other Clinician: [name], [name] Referring Addalynne Golding: Treating Joshua Zeringue/Extender: [name] in Treatment: 4 Active Inactive Pain, Acute or  Chronic Nursing Diagnoses: Pain, acute or chronic: actual or potential Potential alteration in comfort, pain Goals: Patient will verbalize adequate pain control and receive pain control interventions during procedures as needed Date Initiated: 01/02/2022 Target Resolution Date: 03/09/2022 Goal Status: Active Patient/caregiver will verbalize adequate pain control between visits Date Initiated: 01/02/2022 Target Resolution Date: 03/09/2022 Goal Status: Active Interventions: Encourage patient to take pain medications as prescribed Provide education on pain management Reposition patient for comfort Treatment Activities: Administer pain control measures as ordered :  Measurement X - Simple Wound Cleansing - one wound 1 5 '[]'$  - 0 Complex Wound Cleansing - multiple wounds X- 1 5 Wound Imaging (photographs - any number of wounds) '[]'$  - 0 Wound Tracing (instead of photographs) X- 1 5 Simple Wound Measurement - one wound '[]'$  - 0 Complex Wound Measurement - multiple wounds INTERVENTIONS - Wound Dressings X - Small Wound Dressing one or multiple wounds 1 10 '[]'$  - 0 Medium Wound Dressing one or multiple wounds '[]'$  - 0 Large Wound Dressing one or multiple wounds '[]'$  - 0 Application of Medications - topical '[]'$  - 0 Application of Medications - injection INTERVENTIONS - Miscellaneous '[]'$  - 0 External ear exam '[]'$  - 0 Specimen Collection (cultures, biopsies, blood, body fluids, etc.) '[]'$  - 0 Specimen(s) / Culture(s) sent or taken to Lab for analysis '[]'$  - 0 Patient Transfer (multiple staff / [employer] Service fast streamer / Similar devices) '[]'$  - 0 Simple Staple / Suture removal (25 or less) '[]'$  - 0 Complex Staple / Suture removal (26 or more) '[]'$  - 0 Hypo / Hyperglycemic Management (close monitor of Blood Glucose) [name], [name] ([id number]) [id number] Page 3 of 7 '[]'$  - 0 Ankle / Brachial Index (ABI) - do not check if billed separately X- 1 5 Vital Signs Has the patient been seen at the hospital within the last three years: Yes Total Score: 80 Level Of Care: New/Established - Level 3 Electronic Signature(s) Signed: 01/30/2022 3:58:15 PM By: [name] Entered By: [name] on 01/30/2022 14:00:44 -------------------------------------------------------------------------------- Encounter Discharge Information Details Patient Name: Date of Service: Marissa Perking Cardenas. 01/30/2022 1:30 PM Medical Record  Number: [id number] Patient Account Number: [id number] Date of Birth/Sex: Treating RN: December 10, 1950 (71 y.o. [name], [name] Primary Care Abriella Filkins: [name], [name] Other Clinician: Referring Rondy Krupinski: Treating Catherine Cubero/Extender: [name] in Treatment: 4 Encounter Discharge Information Items Discharge Condition: Stable Ambulatory Status: Ambulatory Discharge Destination: Home Transportation: Private Auto Accompanied By: self Schedule Follow-up Appointment: Yes Clinical Summary of Care: Patient Declined Electronic Signature(s) Signed: 01/30/2022 3:58:15 PM By: [name] Entered By: [name] on 01/30/2022 14:25:35 -------------------------------------------------------------------------------- Lower Extremity Assessment Details Patient Name: Date of Service: Marissa [name]. 01/30/2022 1:30 PM Medical Record Number: [id number] Patient Account Number: [id number] Date of Birth/Sex: Treating RN: 09-Oct-1950 (71 y.o. [name], [name] Primary Care Sanskriti Greenlaw: [name], [name] Other Clinician: Referring Andelyn Spade: Treating Krystle Oberman/Extender: [name] in Treatment: 4 Electronic Signature(s) Signed: 01/30/2022 4:43:23 PM By: Deon Pilling RN, BSN Entered By: Deon Pilling on 01/30/2022 13:49:55 -------------------------------------------------------------------------------- [place] Details Patient Name: Date of Service: Marissa Perking Cardenas. 01/30/2022 1:30 PM Medical Record Number: [id number] Patient Account Number: [id number] [name], [name] ([id number]) [id number].pdf Page 4 of 7 Date of Birth/Sex: Treating RN: 05/06/1950 (71 y.o. [name], [name] Primary Care Reeta Kuk: Other Clinician: [name], [name] Referring Addalynne Golding: Treating Joshua Zeringue/Extender: [name] in Treatment: 4 Active Inactive Pain, Acute or  Chronic Nursing Diagnoses: Pain, acute or chronic: actual or potential Potential alteration in comfort, pain Goals: Patient will verbalize adequate pain control and receive pain control interventions during procedures as needed Date Initiated: 01/02/2022 Target Resolution Date: 03/09/2022 Goal Status: Active Patient/caregiver will verbalize adequate pain control between visits Date Initiated: 01/02/2022 Target Resolution Date: 03/09/2022 Goal Status: Active Interventions: Encourage patient to take pain medications as prescribed Provide education on pain management Reposition patient for comfort Treatment Activities: Administer pain control measures as ordered :  [name] ([id number]) [id number] Page 7 of 7 Induration: No Erythema: No Rash: No Hemosiderin Staining: No Scarring: No Mottled: No Pallor: No Moisture Rubor: No No Abnormalities Noted: No Dry / Scaly: No Maceration: No Treatment Notes Wound #1 (Sacrum) Cleanser Wound Cleanser Discharge Instruction: Cleanse the wound with wound cleanser prior to applying a clean  dressing using gauze sponges, not tissue or cotton balls. Peri-Wound Care Skin Prep Discharge Instruction: Use skin prep as directed Topical Primary Dressing Hydrofera Blue Ready Foam, 2.5 x2.5 in Discharge Instruction: Lightly pack into wound bed. Secondary Dressing Zetuvit Plus Silicone Border Dressing 4x4 (in/in) Discharge Instruction: Apply silicone border over primary dressing as directed. Secured With Compression Wrap Compression Stockings Environmental education officer) Signed: 01/30/2022 4:43:23 PM By: Deon Pilling RN, BSN Entered By: Deon Pilling on 01/30/2022 13:54:47 -------------------------------------------------------------------------------- [place] Details Patient Name: Date of Service: [name], Marissa Pew Cardenas. 01/30/2022 1:30 PM Medical Record Number: [id number] Patient Account Number: [id number] Date of Birth/Sex: Treating RN: February 16, 1951 (71 y.o. [name], [name] Primary Care Decklin Weddington: [name], [name] Other Clinician: Referring Lexy Meininger: Treating Syniyah Bourne/Extender: [name] in Treatment: 4 Vital Signs Time Taken: 13:49 Temperature (F): 98.6 Height (in): 64 Pulse (bpm): 79 Weight (lbs): 97 Respiratory Rate (breaths/min): 20 Body Mass Index (BMI): 16.6 Blood Pressure (mmHg): 112/72 Reference Range: 80 - 120 mg / dl Electronic Signature(s) Signed: 01/30/2022 4:43:23 PM By: Deon Pilling RN, BSN Entered By: Deon Pilling on 01/30/2022 13:49:35

## 2022-02-01 DIAGNOSIS — Z79899 Other long term (current) drug therapy: Secondary | ICD-10-CM | POA: Diagnosis not present

## 2022-02-06 ENCOUNTER — Encounter (HOSPITAL_BASED_OUTPATIENT_CLINIC_OR_DEPARTMENT_OTHER): Payer: Medicare Other | Admitting: General Surgery

## 2022-02-06 DIAGNOSIS — M6281 Muscle weakness (generalized): Secondary | ICD-10-CM | POA: Diagnosis not present

## 2022-02-06 DIAGNOSIS — L89154 Pressure ulcer of sacral region, stage 4: Secondary | ICD-10-CM | POA: Diagnosis not present

## 2022-02-06 DIAGNOSIS — E43 Unspecified severe protein-calorie malnutrition: Secondary | ICD-10-CM | POA: Diagnosis not present

## 2022-02-06 DIAGNOSIS — Z681 Body mass index (BMI) 19 or less, adult: Secondary | ICD-10-CM | POA: Diagnosis not present

## 2022-02-06 NOTE — Progress Notes (Signed)
No Abnormalities Noted: No Moisture No Abnormalities Noted: No Treatment Notes Wound #1 (Sacrum) Cleanser Wound Cleanser Discharge Instruction: Cleanse the wound with wound cleanser prior to applying a clean dressing using gauze sponges, not tissue or cotton balls. Peri-Wound Care Skin Prep Discharge Instruction: Use skin prep as directed Topical Primary Dressing Hydrofera Blue Ready Foam, 2.5 x2.5 in Discharge Instruction: Lightly pack into wound bed. Secondary Dressing Zetuvit Plus Silicone Border Dressing 4x4 (in/in) Discharge Instruction: Apply silicone border over primary dressing as directed. Secured With Compression Wrap Compression Stockings Add-Ons [name] Cardenas ([id number]) [id number] Page 5 of 5 Electronic Signature(s) Signed: 02/06/2022 4:23:54 PM By: [name] Cardenas Entered By: [name] Cardenas on 02/06/2022 12:25:21 -------------------------------------------------------------------------------- Vitals Details Patient Name: Date of Service: [name] Cardenas Cardenas. 02/06/2022 1:15 PM Medical Record Number: [id number] Patient Account Number: [id number] Date of Birth/Sex: Treating Cardenas: April 15, 1950 (71 y.o. [name] Cardenas, [name] Primary Care [name] Cardenas: Marissa Mac., [name] Cardenas Other Clinician: Referring [name] Cardenas: Treating [name] Cardenas/Extender: Marissa Hawks., [name] Cardenas in Treatment: 5 Vital Signs Time Taken: 12:24 Temperature (F): 97.9 Height (in): 64 Pulse (bpm): 67 Weight (lbs): 97 Respiratory Rate (breaths/min): 17 Body Mass Index (BMI): 16.6 Blood Pressure (mmHg): 136/80 Reference Range: 80 - 120 mg / dl Electronic Signature(s) Signed: 02/06/2022 4:23:54 PM By: [name] Cardenas Entered By: [name] Cardenas on 02/06/2022 12:25:12  [name], [name] Cardenas ([id number]) [id number] Page 1 of 5 Visit Report for 02/06/2022 Arrival Information Details Patient Name: Date of Service: [name] Cardenas 02/06/2022 1:15 PM Medical Record Number: [id number] Patient Account Number: [id number] Date of Birth/Sex: Treating Cardenas: 07-27-1950 (71 y.o. [name] Cardenas, [name] Primary Care Sheliah Fiorillo: Marissa Mac., [name] Cardenas Other Clinician: Referring [name] Cardenas: Treating [name] Cardenas/Extender: Marissa Hawks., [name] Cardenas in Treatment: 5 Visit Information History Since Last Visit Added or deleted any medications: No Patient Arrived: Ambulatory Any new allergies or adverse reactions: No Arrival Time: 12:24 Had a fall or experienced change in No Accompanied By: self activities of daily living that may affect Transfer Assistance: None risk of falls: Patient Identification Verified: Yes Signs or symptoms of abuse/neglect since last visito No Secondary Verification Process Completed: Yes Hospitalized since last visit: No Patient Requires Transmission-Based Precautions: No Implantable device outside of the clinic excluding No Patient Has Alerts: No cellular tissue based products placed in the center since last visit: Has Dressing in Place as Prescribed: Yes Pain Present Now: No Electronic Signature(s) Signed: 02/06/2022 4:23:54 PM By: [name] Cardenas Entered By: [name] Cardenas on 02/06/2022 12:24:56 -------------------------------------------------------------------------------- Clinic Level of Care Assessment Details Patient Name: Date of Service: [name] Cardenas 02/06/2022 1:15 PM Medical Record Number: [id number] Patient Account Number: [id number] Date of Birth/Sex: Treating Cardenas: 1950/08/27 (71 y.o. [name] Cardenas, [name] Primary Care Hayde Kilgour: Marissa Mac., [name] Cardenas Other Clinician: Referring [name] Cardenas: Treating [name] Cardenas/Extender: Marissa Hawks., [name] Cardenas in Treatment: 5 Clinic  Level of Care Assessment Items TOOL 4 Quantity Score X- 1 0 Use when only an EandM is performed on FOLLOW-UP visit ASSESSMENTS - Nursing Assessment / Reassessment X- 1 10 Reassessment of [name]-morbidities (includes updates in patient status) X- 1 5 Reassessment of Adherence to Treatment Plan ASSESSMENTS - Wound and Skin A ssessment / Reassessment X - Simple Wound Assessment / Reassessment - one wound 1 5 '[]'$  - 0 Complex Wound Assessment / Reassessment - multiple wounds '[]'$  - 0 Dermatologic / Skin Assessment (not related to wound area) ASSESSMENTS - Focused Assessment '[]'$  - 0 Circumferential Edema Measurements - multi extremities '[]'$  - 0 Nutritional Assessment / Counseling / Intervention ARYAHI, DENZLER Cardenas ([id number]) [id number] Page 2 of 5 '[]'$  - 0 Lower Extremity Assessment (monofilament, tuning fork, pulses) '[]'$  - 0 Peripheral Arterial Disease Assessment (using hand held doppler) ASSESSMENTS - Ostomy and/or Continence Assessment and Care '[]'$  - 0 Incontinence Assessment and Management '[]'$  - 0 Ostomy Care Assessment and Management (repouching, etc.) PROCESS - Coordination of Care X - Simple Patient / Family Education for ongoing care 1 15 '[]'$  - 0 Complex (extensive) Patient / Family Education for ongoing care X- 1 10 Staff obtains Programmer, systems, Records, T Results / Process Orders est '[]'$  - 0 Staff telephones HHA, Nursing Homes / Clarify orders / etc '[]'$  - 0 Routine Transfer to another Facility (non-emergent condition) '[]'$  - 0 Routine Hospital Admission (non-emergent condition) '[]'$  - 0 New Admissions / Biomedical engineer / Ordering NPWT Apligraf, etc. , '[]'$  - 0 Emergency Hospital Admission (emergent condition) X- 1 10 Simple Discharge Coordination '[]'$  - 0 Complex (extensive) Discharge Coordination PROCESS - Special Needs '[]'$  - 0 Pediatric / Minor Patient Management '[]'$  - 0 Isolation Patient Management '[]'$  - 0 Hearing / Language / Visual special needs '[]'$   - 0 Assessment of Community assistance (transportation, D/C planning, etc.) '[]'$  - 0 Additional assistance / Altered mentation '[]'$  - 0 Support Surface(s) Assessment (bed, cushion, seat, etc.) INTERVENTIONS - Wound Cleansing / Measurement  [name], [name] Cardenas ([id number]) [id number] Page 1 of 5 Visit Report for 02/06/2022 Arrival Information Details Patient Name: Date of Service: [name] Cardenas 02/06/2022 1:15 PM Medical Record Number: [id number] Patient Account Number: [id number] Date of Birth/Sex: Treating Cardenas: 07-27-1950 (71 y.o. [name] Cardenas, [name] Primary Care Sheliah Fiorillo: Marissa Mac., [name] Cardenas Other Clinician: Referring [name] Cardenas: Treating [name] Cardenas/Extender: Marissa Hawks., [name] Cardenas in Treatment: 5 Visit Information History Since Last Visit Added or deleted any medications: No Patient Arrived: Ambulatory Any new allergies or adverse reactions: No Arrival Time: 12:24 Had a fall or experienced change in No Accompanied By: self activities of daily living that may affect Transfer Assistance: None risk of falls: Patient Identification Verified: Yes Signs or symptoms of abuse/neglect since last visito No Secondary Verification Process Completed: Yes Hospitalized since last visit: No Patient Requires Transmission-Based Precautions: No Implantable device outside of the clinic excluding No Patient Has Alerts: No cellular tissue based products placed in the center since last visit: Has Dressing in Place as Prescribed: Yes Pain Present Now: No Electronic Signature(s) Signed: 02/06/2022 4:23:54 PM By: [name] Cardenas Entered By: [name] Cardenas on 02/06/2022 12:24:56 -------------------------------------------------------------------------------- Clinic Level of Care Assessment Details Patient Name: Date of Service: [name] Cardenas 02/06/2022 1:15 PM Medical Record Number: [id number] Patient Account Number: [id number] Date of Birth/Sex: Treating Cardenas: 1950/08/27 (71 y.o. [name] Cardenas, [name] Primary Care Hayde Kilgour: Marissa Mac., [name] Cardenas Other Clinician: Referring [name] Cardenas: Treating [name] Cardenas/Extender: Marissa Hawks., [name] Cardenas in Treatment: 5 Clinic  Level of Care Assessment Items TOOL 4 Quantity Score X- 1 0 Use when only an EandM is performed on FOLLOW-UP visit ASSESSMENTS - Nursing Assessment / Reassessment X- 1 10 Reassessment of [name]-morbidities (includes updates in patient status) X- 1 5 Reassessment of Adherence to Treatment Plan ASSESSMENTS - Wound and Skin A ssessment / Reassessment X - Simple Wound Assessment / Reassessment - one wound 1 5 '[]'$  - 0 Complex Wound Assessment / Reassessment - multiple wounds '[]'$  - 0 Dermatologic / Skin Assessment (not related to wound area) ASSESSMENTS - Focused Assessment '[]'$  - 0 Circumferential Edema Measurements - multi extremities '[]'$  - 0 Nutritional Assessment / Counseling / Intervention ARYAHI, DENZLER Cardenas ([id number]) [id number] Page 2 of 5 '[]'$  - 0 Lower Extremity Assessment (monofilament, tuning fork, pulses) '[]'$  - 0 Peripheral Arterial Disease Assessment (using hand held doppler) ASSESSMENTS - Ostomy and/or Continence Assessment and Care '[]'$  - 0 Incontinence Assessment and Management '[]'$  - 0 Ostomy Care Assessment and Management (repouching, etc.) PROCESS - Coordination of Care X - Simple Patient / Family Education for ongoing care 1 15 '[]'$  - 0 Complex (extensive) Patient / Family Education for ongoing care X- 1 10 Staff obtains Programmer, systems, Records, T Results / Process Orders est '[]'$  - 0 Staff telephones HHA, Nursing Homes / Clarify orders / etc '[]'$  - 0 Routine Transfer to another Facility (non-emergent condition) '[]'$  - 0 Routine Hospital Admission (non-emergent condition) '[]'$  - 0 New Admissions / Biomedical engineer / Ordering NPWT Apligraf, etc. , '[]'$  - 0 Emergency Hospital Admission (emergent condition) X- 1 10 Simple Discharge Coordination '[]'$  - 0 Complex (extensive) Discharge Coordination PROCESS - Special Needs '[]'$  - 0 Pediatric / Minor Patient Management '[]'$  - 0 Isolation Patient Management '[]'$  - 0 Hearing / Language / Visual special needs '[]'$   - 0 Assessment of Community assistance (transportation, D/C planning, etc.) '[]'$  - 0 Additional assistance / Altered mentation '[]'$  - 0 Support Surface(s) Assessment (bed, cushion, seat, etc.) INTERVENTIONS - Wound Cleansing / Measurement

## 2022-02-06 NOTE — Progress Notes (Signed)
LETISIA, SCHWALB ([id number]) [id number] Page 1 of 1 Visit Report for 02/06/2022 SuperBill Details Patient Name: Date of Service: [name] 02/06/2022 Medical Record Number: [id number] Patient Account Number: [id number] Date of Birth/Sex: Treating RN: 09-29-1950 (71 y.o. [name], [name] Primary Care Provider: [name], [name] Other Clinician: Referring Provider: Treating Provider/Extender: [name], [name] in Treatment: 5 Diagnosis Coding ICD-10 Codes Code Description [phone] Muscle weakness (generalized) E43 Unspecified severe protein-calorie malnutrition L89.154 Pressure ulcer of sacral region, stage 4 Facility Procedures CPT4 Code Description Modifier Quantity [id number] - WOUND CARE VISIT-LEV 3 EST PT 1 Electronic Signature(s) Signed: 02/06/2022 12:30:29 PM By: Fredirick Maudlin MD FACS Signed: 02/06/2022 4:23:54 PM By: [name] Entered By: [name] on 02/06/2022 12:26:21

## 2022-02-13 ENCOUNTER — Encounter (HOSPITAL_BASED_OUTPATIENT_CLINIC_OR_DEPARTMENT_OTHER): Payer: Medicare Other | Attending: Physician Assistant | Admitting: Physician Assistant

## 2022-02-13 DIAGNOSIS — E43 Unspecified severe protein-calorie malnutrition: Secondary | ICD-10-CM | POA: Insufficient documentation

## 2022-02-13 DIAGNOSIS — M6281 Muscle weakness (generalized): Secondary | ICD-10-CM | POA: Diagnosis not present

## 2022-02-13 DIAGNOSIS — L89154 Pressure ulcer of sacral region, stage 4: Secondary | ICD-10-CM | POA: Diagnosis not present

## 2022-02-13 DIAGNOSIS — Z681 Body mass index (BMI) 19 or less, adult: Secondary | ICD-10-CM | POA: Diagnosis not present

## 2022-02-13 NOTE — Progress Notes (Addendum)
LORIANA, SAMAD R ([id number]) [id number] Page 1 of 6 Visit Report for 02/13/2022 Chief Complaint Document Details Patient Name: Date of Service: [name]. 02/13/2022 11:00 A M Medical Record Number: [id number] Patient Account Number: [id number] Date of Birth/Sex: Treating RN: Mar 19, 1950 (71 y.o. F) Primary Care Provider: [name], [name] Other Clinician: Referring Provider: Treating Provider/Extender: [name] in Treatment: 6 Information Obtained from: Patient Chief Complaint Pressure ulcer stage 3 Sacrum Electronic Signature(s) Signed: 02/13/2022 10:48:00 AM By: Worthy Keeler PA-C Entered By: Worthy Keeler on 02/13/2022 10:48:00 -------------------------------------------------------------------------------- HPI Details Patient Name: Date of Service: [name] 02/13/2022 11:00 A M Medical Record Number: [id number] Patient Account Number: [id number] Date of Birth/Sex: Treating RN: 1951-01-24 (71 y.o. F) Primary Care Provider: [name], [name] Other Clinician: Referring Provider: Treating Provider/Extender: [name] in Treatment: 6 History of Present Illness HPI Description: 01-02-22 upon evaluation today patient presents for initial inspection here in our clinic concerning a wound that she has over the sacral region. This is stated to be present since around the beginning of August 2023. She currently resides in a assisted nursing facility. She does seem to be able to answer questions fully today. Subsequently I do note that she is a little on the thin side but again other than the protein calorie malnutrition she is minimally weak but still does get up and move around some but she also tells me that she "sits a lot". She has not had any x-rays of the sacral region at this point. 01-09-2022 upon evaluation today patient's wound in the sacral area actually appears to be  doing decently well. Fortunately I do not see any signs of infection which is great news and overall I am extremely pleased with where things stand today. 11/8; this is a patient who lives in some form of small assisted living in [place]. She has a deep wound over the lower part of her coccyx. An x-ray that we ordered from last time did not show any osseous abnormalities. We are supposed to be using Hydrofera Blue in the wound but I am really not sure what they are using to dress this and who is doing it. Talking to our staff they have apparently discussed this with the staff in the facility. She does not have home health. 01-23-2022 upon evaluation today patient appears to be doing decently well in regard to her wound. She has been tolerating the dressing changes without complication although I am not certain the dressing changes have been done appropriately over the past couple of weeks. We have been trying to get her to come in here we also try to get her into a wound care center in [place] which would be closer for the assisted living facility. Unfortunately neither 1 of those were undertaken up to this point. The patient did end up having some training of the staff from an RN that they brought him to have the staff perform the dressing changes but that being said it does not sound like this has been done correctly. Nonetheless I do believe that based on what we see it would benefit the patient to actually have her come here 3 times a week also think a wound VAC could potentially be a possibility for her which would help to get things moving in a much better direction as far as healing is concerned. We need to get this to fill-in though it looks  clean and does not look infected I do think that we need to really get this to fill-in and there is quite a bit of undermining that is to be considered here. 01-30-2022 upon evaluation today patient appears to be doing well currently in regard to her wound  which is looking pretty decent but still has quite a bit of space underneath as far as undermining is concerned. I think she might possibly be better with a wound VAC. With that being said if when I do this we probably only be able to do it 2 times a week at most. I discussed that with the patient today she is okay with that we just need to see if we can get the insurance approval and then of course the scheduling underway. 02-13-2022 upon evaluation today patient appears to be doing well currently in regard to her wound. She actually tells me that it is feeling a lot better which is good news. Fortunately I do not see any signs of active infection at this time. No fevers, chills, nausea, vomiting, or diarrhea. MADDUX, FIRST R ([id number]) [id number] Page 2 of 6 Electronic Signature(s) Signed: 02/13/2022 11:34:39 AM By: Worthy Keeler PA-C Entered By: Worthy Keeler on 02/13/2022 11:34:39 -------------------------------------------------------------------------------- Physical Exam Details Patient Name: Date of Service: [name] 02/13/2022 11:00 A M Medical Record Number: [id number] Patient Account Number: [id number] Date of Birth/Sex: Treating RN: [birth year] (71 y.o. F) Primary Care Provider: [name], [name] Other Clinician: Referring Provider: Treating Provider/Extender: [name] in Treatment: 6 Constitutional Well-nourished and well-hydrated in no acute distress. Respiratory normal breathing without difficulty. Psychiatric this patient is able to make decisions and demonstrates good insight into disease process. Alert and Oriented x 3. pleasant and cooperative. Notes Upon inspection patient's wound bed again mainly has undermining and 12:00 location. I do think a snap VAC could be beneficial and that something that we discussed today is Well is a possibility we will get a check into the approval and that. If we  get a snap VAC going I think that can stay on for the week and that would actually be a really good option here. Electronic Signature(s) Signed: 02/13/2022 11:35:07 AM By: Worthy Keeler PA-C Entered By: Worthy Keeler on 02/13/2022 11:35:07 -------------------------------------------------------------------------------- Physician Orders Details Patient Name: Date of Service: [name] 02/13/2022 11:00 A M Medical Record Number: [id number] Patient Account Number: [id number] Date of Birth/Sex: Treating RN: May 27, 1950 (71 y.o. [name], [name] Primary Care Provider: [name], [name] Other Clinician: Referring Provider: Treating Provider/Extender: [name] in Treatment: 6 Verbal / Phone Orders: No Diagnosis Coding ICD-10 Coding Code Description M62.81 Muscle weakness (generalized) E43 Unspecified severe protein-calorie malnutrition L89.154 Pressure ulcer of sacral region, stage 4 Follow-up Appointments ppointment in 2 weeks. Burman Blacksmith, [place] WEdnesday's Return A Nurse Visit: - next WEdnesday 02/20/22 Other: - Administrator Arbutus Ped) : [phone] Anesthetic (In clinic) Topical Lidocaine 5% applied to wound bed Bathing/ Shower/ [address] WRENN, WILLCOX R ([id number]) [id number] Page 3 of 6 May shower with protection but do not get wound dressing(s) wet. - If dressing gets wet, please change right away as it can lead to infection/worsening of wound if not changed. ***Patient to only shower on days dressing gets changed (MOnday,Wednesday,Friday). She should shower just before dressing changes and immediately have dressing put back on after shower is done*** Off-Loading Turn and reposition every 2 hours Wound Treatment Wound #1 -  Sacrum Cleanser: Wound Cleanser (Home Health) 3 x Per Week/30 Days Discharge Instructions: Cleanse the wound with wound cleanser prior to applying a clean dressing using gauze sponges,  not tissue or cotton balls. Peri-Wound Care: Skin Prep (Home Health) 3 x Per Week/30 Days Discharge Instructions: Use skin prep as directed Prim Dressing: Hydrofera Blue Classic Foam Rope Dressing, 9x6 (mm/in) 3 x Per Week/30 Days ary Discharge Instructions: Moisten with saline prior to packing. May cut rope in half if having a hard time packing into wound. Secondary Dressing: Zetuvit Plus Silicone Border Dressing 4x4 (in/in) (Home Health) 3 x Per Week/30 Days Discharge Instructions: Apply silicone border over primary dressing as directed. Electronic Signature(s) Signed: 02/13/2022 4:11:00 PM By: Worthy Keeler PA-C Signed: 03/13/2022 5:35:40 PM By: [name] Entered By: [name] on 02/13/2022 11:30:18 -------------------------------------------------------------------------------- Problem List Details Patient Name: Date of Service: [name] 02/13/2022 11:00 A M Medical Record Number: [id number] Patient Account Number: [id number] Date of Birth/Sex: Treating RN: [birth year] (71 y.o. F) Primary Care Provider: [name], [name] Other Clinician: Referring Provider: Treating Provider/Extender: [name] in Treatment: 6 Active Problems ICD-10 Encounter Code Description Active Date MDM Diagnosis M62.81 Muscle weakness (generalized) 01/02/2022 No Yes E43 Unspecified severe protein-calorie malnutrition 01/02/2022 No Yes L89.154 Pressure ulcer of sacral region, stage 4 01/02/2022 No Yes Inactive Problems Resolved Problems Electronic Signature(s) Signed: 02/13/2022 10:47:52 AM By: Worthy Keeler PA-C Entered By: Worthy Keeler on 02/13/2022 10:47:52 Romero Liner R ([id number]) [id number] Page 4 of 6 -------------------------------------------------------------------------------- Progress Note Details Patient Name: Date of Service: [name] 02/13/2022 11:00 A M Medical Record Number:  [id number] Patient Account Number: [id number] Date of Birth/Sex: Treating RN: 1950-03-27 (71 y.o. F) Primary Care Provider: [name], [name] Other Clinician: Referring Provider: Treating Provider/Extender: [name] in Treatment: 6 Subjective Chief Complaint Information obtained from Patient Pressure ulcer stage 3 Sacrum History of Present Illness (HPI) 01-02-22 upon evaluation today patient presents for initial inspection here in our clinic concerning a wound that she has over the sacral region. This is stated to be present since around the beginning of August 2023. She currently resides in a assisted nursing facility. She does seem to be able to answer questions fully today. Subsequently I do note that she is a little on the thin side but again other than the protein calorie malnutrition she is minimally weak but still does get up and move around some but she also tells me that she "sits a lot". She has not had any x-rays of the sacral region at this point. 01-09-2022 upon evaluation today patient's wound in the sacral area actually appears to be doing decently well. Fortunately I do not see any signs of infection which is great news and overall I am extremely pleased with where things stand today. 11/8; this is a patient who lives in some form of small assisted living in [place]. She has a deep wound over the lower part of her coccyx. An x-ray that we ordered from last time did not show any osseous abnormalities. We are supposed to be using Hydrofera Blue in the wound but I am really not sure what they are using to dress this and who is doing it. Talking to our staff they have apparently discussed this with the staff in the facility. She does not have home health. 01-23-2022 upon evaluation today patient appears to be doing decently well in regard to  her wound. She has been tolerating the dressing changes without complication although I am not certain the  dressing changes have been done appropriately over the past couple of weeks. We have been trying to get her to come in here we also try to get her into a wound care center in [place] which would be closer for the assisted living facility. Unfortunately neither 1 of those were undertaken up to this point. The patient did end up having some training of the staff from an RN that they brought him to have the staff perform the dressing changes but that being said it does not sound like this has been done correctly. Nonetheless I do believe that based on what we see it would benefit the patient to actually have her come here 3 times a week also think a wound VAC could potentially be a possibility for her which would help to get things moving in a much better direction as far as healing is concerned. We need to get this to fill-in though it looks clean and does not look infected I do think that we need to really get this to fill-in and there is quite a bit of undermining that is to be considered here. 01-30-2022 upon evaluation today patient appears to be doing well currently in regard to her wound which is looking pretty decent but still has quite a bit of space underneath as far as undermining is concerned. I think she might possibly be better with a wound VAC. With that being said if when I do this we probably only be able to do it 2 times a week at most. I discussed that with the patient today she is okay with that we just need to see if we can get the insurance approval and then of course the scheduling underway. 02-13-2022 upon evaluation today patient appears to be doing well currently in regard to her wound. She actually tells me that it is feeling a lot better which is good news. Fortunately I do not see any signs of active infection at this time. No fevers, chills, nausea, vomiting, or diarrhea. Objective Constitutional Well-nourished and well-hydrated in no acute distress. Vitals Time Taken: 11:04  AM, Height: 64 in, Weight: 97 lbs, BMI: 16.6, Temperature: 98.1 F, Pulse: 71 bpm, Respiratory Rate: 16 breaths/min, Blood Pressure: 126/82 mmHg. Respiratory normal breathing without difficulty. Psychiatric this patient is able to make decisions and demonstrates good insight into disease process. Alert and Oriented x 3. pleasant and cooperative. General Notes: Upon inspection patient's wound bed again mainly has undermining and 12:00 location. I do think a snap VAC could be beneficial and that something that we discussed today is Well is a possibility we will get a check into the approval and that. If we get a snap VAC going I think that can stay on for the week and that would actually be a really good option here. Integumentary (Hair, Skin) Wound #1 status is Open. Original cause of wound was Pressure Injury. The date acquired was: 10/09/2021. The wound has been in treatment 6 weeks. The wound is located on the Sacrum. The wound measures 0.4cm length x 0.2cm width x 0.9cm depth; 0.063cm^2 area and 0.057cm^3 volume. There is a medium amount of serosanguineous drainage noted. There is large (67-100%) granulation within the wound bed. The periwound skin appearance did not exhibit: Callus, Crepitus, Excoriation, Induration, Rash, Scarring, Dry/Scaly, Maceration, Atrophie Blanche, Cyanosis, Ecchymosis, Hemosiderin Staining, Detta, Mellin R ([id number]) [phone] Page 5 of 6  Rubor, Erythema. Assessment Active Problems ICD-10 Muscle weakness (generalized) Unspecified severe protein-calorie malnutrition Pressure ulcer of sacral region, stage 4 Plan Follow-up Appointments: Return Appointment in 2 weeks. Burman Blacksmith, [place] Nurse Visit: - next WEdnesday 02/20/22 Other: - Administrator Arbutus Ped) : [phone] Anesthetic: (In clinic) Topical Lidocaine 5% applied to wound bed Bathing/ Shower/ Hygiene: May shower with protection but do not get  wound dressing(s) wet. - If dressing gets wet, please change right away as it can lead to infection/worsening of wound if not changed. ***Patient to only shower on days dressing gets changed (MOnday,Wednesday,Friday). She should shower just before dressing changes and immediately have dressing put back on after shower is done*** Off-Loading: Turn and reposition every 2 hours WOUND #1: - Sacrum Wound Laterality: Cleanser: Wound Cleanser (Home Health) 3 x Per Week/30 Days Discharge Instructions: Cleanse the wound with wound cleanser prior to applying a clean dressing using gauze sponges, not tissue or cotton balls. Peri-Wound Care: Skin Prep (Home Health) 3 x Per Week/30 Days Discharge Instructions: Use skin prep as directed Prim Dressing: Hydrofera Blue Classic Foam Rope Dressing, 9x6 (mm/in) 3 x Per Week/30 Days ary Discharge Instructions: Moisten with saline prior to packing. May cut rope in half if having a hard time packing into wound. Secondary Dressing: Zetuvit Plus Silicone Border Dressing 4x4 (in/in) (Home Health) 3 x Per Week/30 Days Discharge Instructions: Apply silicone border over primary dressing as directed. 1. Based on what I see I do believe that the patient would benefit from an ongoing continuation of the [facility] although I do believe using the snap VAC could be of benefit here. 2. I am also can recommend that we have the patient continue with the Zetuvit bordered foam dressing to cover for the time being until we see about checking on the approval for the snap VAC. We will see patient back for reevaluation in 1 week here in the clinic. If anything worsens or changes patient will contact our office for additional recommendations. Electronic Signature(s) Signed: 02/13/2022 11:35:37 AM By: Worthy Keeler PA-C Entered By: Worthy Keeler on 02/13/2022 11:35:36 -------------------------------------------------------------------------------- SuperBill Details Patient  Name: Date of Service: [name] 02/13/2022 Medical Record Number: [id number] Patient Account Number: [id number] Date of Birth/Sex: Treating RN: 1950/03/21 (71 y.o. [name], [name] Primary Care Provider: [name], [name] Other Clinician: Referring Provider: Treating Provider/Extender: [name] in Treatment: 6 Diagnosis Coding ICD-10 Codes Code Description M62.81 Muscle weakness (generalized) DENYM, CHRISTENBERRY R ([id number]) [id number] Page 6 of 6 E43 Unspecified severe protein-calorie malnutrition L89.154 Pressure ulcer of sacral region, stage 4 Facility Procedures : CPT4 Code: [id number] Description: 99213 - WOUND CARE VISIT-LEV 3 EST PT Modifier: Quantity: 1 Physician Procedures : CPT4 Code Description Modifier [id number] - WC PHYS LEVEL 4 - EST PT ICD-10 Diagnosis Description M62.81 Muscle weakness (generalized) E43 Unspecified severe protein-calorie malnutrition L89.154 Pressure ulcer of sacral region, stage 4 Quantity: 1 Electronic Signature(s) Signed: 02/13/2022 11:36:13 AM By: Worthy Keeler PA-C Entered By: Worthy Keeler on 02/13/2022 11:36:13

## 2022-02-20 ENCOUNTER — Encounter (HOSPITAL_BASED_OUTPATIENT_CLINIC_OR_DEPARTMENT_OTHER): Payer: Medicare Other | Admitting: Internal Medicine

## 2022-02-20 DIAGNOSIS — G4700 Insomnia, unspecified: Secondary | ICD-10-CM | POA: Diagnosis not present

## 2022-02-20 DIAGNOSIS — M6281 Muscle weakness (generalized): Secondary | ICD-10-CM | POA: Diagnosis not present

## 2022-02-20 DIAGNOSIS — E43 Unspecified severe protein-calorie malnutrition: Secondary | ICD-10-CM | POA: Diagnosis not present

## 2022-02-20 DIAGNOSIS — Z681 Body mass index (BMI) 19 or less, adult: Secondary | ICD-10-CM | POA: Diagnosis not present

## 2022-02-20 DIAGNOSIS — M542 Cervicalgia: Secondary | ICD-10-CM | POA: Diagnosis not present

## 2022-02-20 DIAGNOSIS — Z79899 Other long term (current) drug therapy: Secondary | ICD-10-CM | POA: Diagnosis not present

## 2022-02-20 DIAGNOSIS — R030 Elevated blood-pressure reading, without diagnosis of hypertension: Secondary | ICD-10-CM | POA: Diagnosis not present

## 2022-02-20 DIAGNOSIS — L89154 Pressure ulcer of sacral region, stage 4: Secondary | ICD-10-CM | POA: Diagnosis not present

## 2022-02-20 DIAGNOSIS — K861 Other chronic pancreatitis: Secondary | ICD-10-CM | POA: Diagnosis not present

## 2022-02-20 DIAGNOSIS — F32A Depression, unspecified: Secondary | ICD-10-CM | POA: Diagnosis not present

## 2022-02-20 DIAGNOSIS — J449 Chronic obstructive pulmonary disease, unspecified: Secondary | ICD-10-CM | POA: Diagnosis not present

## 2022-02-20 NOTE — Progress Notes (Signed)
CARLIE, KARABA Cardenas ([id number]) [id number] Page 1 of 5 Visit Report for 02/20/2022 Arrival Information Details Patient Name: Date of Service: [name]. 02/20/2022 10:00 A M Medical Record Number: [id number] Patient Account Number: [id number] Date of Birth/Sex: Treating RN: 25-Jun-1950 (71 y.o. F) Primary Care Natayah Warmack: Jayme Cloud., [name] Other Clinician: Referring Ruberta Holck: Treating Tori Cupps/Extender: Doyce Para., [name] in Treatment: 7 Visit Information History Since Last Visit Added or deleted any medications: No Patient Arrived: Ambulatory Any new allergies or adverse reactions: No Arrival Time: 11:56 Had a fall or experienced change in No Accompanied By: caregiver activities of daily living that may affect Transfer Assistance: None risk of falls: Patient Identification Verified: Yes Signs or symptoms of abuse/neglect since last visito No Secondary Verification Process Completed: Yes Hospitalized since last visit: No Patient Requires Transmission-Based Precautions: No Implantable device outside of the clinic excluding No Patient Has Alerts: No cellular tissue based products placed in the center since last visit: Has Dressing in Place as Prescribed: No Pain Present Now: No Electronic Signature(s) Signed: 02/20/2022 12:00:46 PM By: Thayer Dallas Entered By: Thayer Dallas on 02/20/2022 11:56:56 -------------------------------------------------------------------------------- Clinic Level of Care Assessment Details Patient Name: Date of Service: [name] 02/20/2022 10:00 A M Medical Record Number: [id number] Patient Account Number: [id number] Date of Birth/Sex: Treating RN: 19-Jul-1950 (71 y.o. F) Primary Care Lavada Langsam: Jayme Cloud., Cardenas Sawyers Clinician: Thayer Dallas Referring Dillen Belmontes: Treating Lazaria Schaben/Extender: Doyce Para., [name] in Treatment: 7 Clinic Level of Care Assessment  Items TOOL 4 Quantity Score X- 1 0 Use when only an EandM is performed on FOLLOW-UP visit ASSESSMENTS - Nursing Assessment / Reassessment X- 1 10 Reassessment of Marissa-morbidities (includes updates in patient status) X- 1 5 Reassessment of Adherence to Treatment Plan ASSESSMENTS - Wound and Skin A ssessment / Reassessment X - Simple Wound Assessment / Reassessment - one wound 1 5 []  - 0 Complex Wound Assessment / Reassessment - multiple wounds X- 1 10 Dermatologic / Skin Assessment (not related to wound area) ASSESSMENTS - Focused Assessment []  - 0 Circumferential Edema Measurements - multi extremities []  - 0 Nutritional Assessment / Counseling / Intervention [name], [name] ([id number]) [phone].pdf Page 2 of 5 []  - 0 Lower Extremity Assessment (monofilament, tuning fork, pulses) []  - 0 Peripheral Arterial Disease Assessment (using hand held doppler) ASSESSMENTS - Ostomy and/or Continence Assessment and Care []  - 0 Incontinence Assessment and Management []  - 0 Ostomy Care Assessment and Management (repouching, etc.) PROCESS - Coordination of Care X - Simple Patient / Family Education for ongoing care 1 15 []  - 0 Complex (extensive) Patient / Family Education for ongoing care []  - 0 Staff obtains Chiropractor, Records, T Results / Process Orders est []  - 0 Staff telephones HHA, Nursing Homes / Clarify orders / etc []  - 0 Routine Transfer to another Facility (non-emergent condition) []  - 0 Routine Hospital Admission (non-emergent condition) []  - 0 New Admissions / Manufacturing engineer / Ordering NPWT Apligraf, etc. , []  - 0 Emergency Hospital Admission (emergent condition) X- 1 10 Simple Discharge Coordination []  - 0 Complex (extensive) Discharge Coordination PROCESS - Special Needs []  - 0 Pediatric / Minor Patient Management []  - 0 Isolation Patient Management []  - 0 Hearing / Language / Visual special needs []  - 0 Assessment of  Community assistance (transportation, D/C planning, etc.) []  - 0 Additional assistance / Altered mentation []  - 0 Support Surface(s) Assessment (bed, cushion, seat, etc.) INTERVENTIONS - Wound Cleansing / Measurement X -  Simple Wound Cleansing - one wound 1 5 []  - 0 Complex Wound Cleansing - multiple wounds []  - 0 Wound Imaging (photographs - any number of wounds) []  - 0 Wound Tracing (instead of photographs) X- 1 5 Simple Wound Measurement - one wound []  - 0 Complex Wound Measurement - multiple wounds INTERVENTIONS - Wound Dressings X - Small Wound Dressing one or multiple wounds 1 10 []  - 0 Medium Wound Dressing one or multiple wounds []  - 0 Large Wound Dressing one or multiple wounds []  - 0 Application of Medications - topical []  - 0 Application of Medications - injection INTERVENTIONS - Miscellaneous []  - 0 External ear exam []  - 0 Specimen Collection (cultures, biopsies, blood, body fluids, etc.) []  - 0 Specimen(s) / Culture(s) sent or taken to Lab for analysis []  - 0 Patient Transfer (multiple staff / Nurse, adult / Similar devices) []  - 0 Simple Staple / Suture removal (25 or less) []  - 0 Complex Staple / Suture removal (26 or more) []  - 0 Hypo / Hyperglycemic Management (close monitor of Blood Glucose) [name], [name] ([id number]) [id number] Page 3 of 5 []  - 0 Ankle / Brachial Index (ABI) - do not check if billed separately []  - 0 Vital Signs Has the patient been seen at the hospital within the last three years: Yes Total Score: 75 Level Of Care: New/Established - Level 2 Electronic Signature(s) Signed: 02/20/2022 12:00:46 PM By: Thayer Dallas Entered By: Thayer Dallas on 02/20/2022 11:58:58 -------------------------------------------------------------------------------- Encounter Discharge Information Details Patient Name: Date of Service: [name]. 02/20/2022 10:00 A M Medical Record Number: [id number] Patient  Account Number: [id number] Date of Birth/Sex: Treating RN: 11-Aug-1950 (71 y.o. F) Primary Care Sanaia Jasso: Jayme Cloud., Cardenas Sawyers Clinician: Thayer Dallas Referring Hayes Rehfeldt: Treating Shanera Meske/Extender: Doyce Para., [name] in Treatment: 7 Encounter Discharge Information Items Discharge Condition: Stable Ambulatory Status: Ambulatory Discharge Destination: Home Transportation: Private Auto Accompanied By: caregiver Schedule Follow-up Appointment: Yes Clinical Summary of Care: Electronic Signature(s) Signed: 02/20/2022 12:00:46 PM By: Thayer Dallas Entered By: Thayer Dallas on 02/20/2022 11:59:53 -------------------------------------------------------------------------------- Patient/Caregiver Education Details Patient Name: Date of Service: [name] 12/13/2023andnbsp10:00 A M Medical Record Number: [id number] Patient Account Number: [id number] Date of Birth/Gender: Treating RN: 1950/04/03 (71 y.o. F) Primary Care Physician: Jayme Cloud., [name] Other Clinician: Thayer Dallas Referring Physician: Treating Physician/Extender: Doyce Para., [name] in Treatment: 7 Education Assessment Education Provided To: Patient Education Topics Provided Electronic Signature(s) Signed: 02/20/2022 12:00:46 PM By: Thayer Dallas Entered By: Thayer Dallas on 02/20/2022 11:59:34 [name] ([id number]) [id number] Page 4 of 5 -------------------------------------------------------------------------------- Wound Assessment Details Patient Name: Date of Service: [name]. 02/20/2022 10:00 A M Medical Record Number: [id number] Patient Account Number: [id number] Date of Birth/Sex: Treating RN: January 14, 1951 (71 y.o. F) Primary Care Layanna Charo: Jayme Cloud., [name] Other Clinician: Referring Artemisia Auvil: Treating Louellen Haldeman/Extender: Doyce Para., [name] in Treatment: 7 Wound Status Wound  Number: 1 Primary Etiology: Pressure Ulcer Wound Location: Sacrum Wound Status: Open Wounding Event: Pressure Injury Date Acquired: 10/09/2021 Weeks Of Treatment: 7 Clustered Wound: No Wound Measurements Length: (cm) 0.4 Width: (cm) 0.2 Depth: (cm) 0.9 Area: (cm) 0.063 Volume: (cm) 0.057 % Reduction in Area: 82.2% % Reduction in Volume: 76.9% Wound Description Classification: Category/Stage IV Exudate Amount: Medium Exudate Type: Serosanguineous Exudate Color: red, brown Periwound Skin Texture Texture Color No Abnormalities Noted: No No Abnormalities Noted: No Moisture No Abnormalities Noted: No Treatment Notes Wound #1 (Sacrum) Cleanser  Wound Cleanser Discharge Instruction: Cleanse the wound with wound cleanser prior to applying a clean dressing using gauze sponges, not tissue or cotton balls. Peri-Wound Care Skin Prep Discharge Instruction: Use skin prep as directed Topical Primary Dressing Hydrofera Blue Classic Foam Rope Dressing, 9x6 (mm/in) Discharge Instruction: Moisten with saline prior to packing. May cut rope in half if having a hard time packing into wound. Secondary Dressing Zetuvit Plus Silicone Border Dressing 4x4 (in/in) Discharge Instruction: Apply silicone border over primary dressing as directed. Secured With Compression Wrap Compression Stockings Facilities manager) Signed: 02/20/2022 12:00:46 PM By: Presley Raddle ([id number]) PM By: Thayer Dallas [phone].pdf Page 5 of 5 Signed: 02/20/2022 12:00:46 Entered By: Thayer Dallas on 02/20/2022 11:57:15 -------------------------------------------------------------------------------- Vitals Details Patient Name: Date of Service: Marissa Deliah Goody Cardenas. 02/20/2022 10:00 A M Medical Record Number: [id number] Patient Account Number: [id number] Date of Birth/Sex: Treating RN: [birth year] (71 y.o. F) Primary Care Toa Mia: Jayme Cloud., [name] Other  Clinician: Referring Latavia Goga: Treating Aspyn Warnke/Extender: Doyce Para., [name] in Treatment: 7 Vital Signs Time Taken: 11:20 Reference Range: 80 - 120 mg / dl Height (in): 64 Weight (lbs): 97 Body Mass Index (BMI): 16.6 Electronic Signature(s) Signed: 02/20/2022 12:00:46 PM By: Thayer Dallas Entered By: Thayer Dallas on 02/20/2022 11:57:04

## 2022-02-21 NOTE — Progress Notes (Signed)
TALANI, BRAZEE R ([id number]) [id number] Page 1 of 1 Visit Report for 02/20/2022 SuperBill Details Patient Name: Date of Service: [name] 02/20/2022 Medical Record Number: [id number] Patient Account Number: [id number] Date of Birth/Sex: Treating RN: [birth year] (71 y.o. F) Primary Care Provider: [name], [name] Other Clinician: Referring Provider: Treating Provider/Extender: Scarlette Slice in Treatment: 7 Diagnosis Coding ICD-10 Codes Code Description M62.81 Muscle weakness (generalized) E43 Unspecified severe protein-calorie malnutrition L89.154 Pressure ulcer of sacral region, stage 4 Facility Procedures CPT4 Code Description Modifier Quantity [id number] - WOUND CARE VISIT-LEV 2 EST PT 1 Electronic Signature(s) Signed: 02/20/2022 12:00:46 PM By: Erenest Blank Signed: 02/20/2022 5:47:10 PM By: Linton Ham MD Entered By: Erenest Blank on 02/20/2022 12:00:11

## 2022-02-22 ENCOUNTER — Encounter (HOSPITAL_COMMUNITY): Payer: Self-pay | Admitting: *Deleted

## 2022-02-22 ENCOUNTER — Emergency Department (HOSPITAL_COMMUNITY)
Admission: EM | Admit: 2022-02-22 | Discharge: 2022-02-23 | Disposition: A | Discharge status: Home / Self Care | Payer: Medicare Other | Attending: Emergency Medicine | Admitting: Emergency Medicine

## 2022-02-22 ENCOUNTER — Other Ambulatory Visit: Payer: Self-pay

## 2022-02-22 ENCOUNTER — Emergency Department (HOSPITAL_COMMUNITY): Payer: Medicare Other

## 2022-02-22 DIAGNOSIS — F17210 Nicotine dependence, cigarettes, uncomplicated: Secondary | ICD-10-CM | POA: Diagnosis not present

## 2022-02-22 DIAGNOSIS — J441 Chronic obstructive pulmonary disease with (acute) exacerbation: Secondary | ICD-10-CM | POA: Diagnosis not present

## 2022-02-22 DIAGNOSIS — Z20822 Contact with and (suspected) exposure to covid-19: Secondary | ICD-10-CM | POA: Diagnosis not present

## 2022-02-22 DIAGNOSIS — J210 Acute bronchiolitis due to respiratory syncytial virus: Secondary | ICD-10-CM | POA: Insufficient documentation

## 2022-02-22 DIAGNOSIS — R059 Cough, unspecified: Secondary | ICD-10-CM | POA: Diagnosis not present

## 2022-02-22 DIAGNOSIS — R0602 Shortness of breath: Secondary | ICD-10-CM | POA: Diagnosis not present

## 2022-02-22 DIAGNOSIS — Z681 Body mass index (BMI) 19 or less, adult: Secondary | ICD-10-CM | POA: Diagnosis not present

## 2022-02-22 DIAGNOSIS — I10 Essential (primary) hypertension: Secondary | ICD-10-CM | POA: Diagnosis not present

## 2022-02-22 DIAGNOSIS — R9431 Abnormal electrocardiogram [ECG] [EKG]: Secondary | ICD-10-CM | POA: Diagnosis not present

## 2022-02-22 DIAGNOSIS — R030 Elevated blood-pressure reading, without diagnosis of hypertension: Secondary | ICD-10-CM | POA: Diagnosis not present

## 2022-02-22 DIAGNOSIS — Z859 Personal history of malignant neoplasm, unspecified: Secondary | ICD-10-CM | POA: Insufficient documentation

## 2022-02-22 DIAGNOSIS — R062 Wheezing: Secondary | ICD-10-CM | POA: Diagnosis not present

## 2022-02-22 DIAGNOSIS — Z79899 Other long term (current) drug therapy: Secondary | ICD-10-CM | POA: Insufficient documentation

## 2022-02-22 DIAGNOSIS — Z131 Encounter for screening for diabetes mellitus: Secondary | ICD-10-CM | POA: Diagnosis not present

## 2022-02-22 DIAGNOSIS — E559 Vitamin D deficiency, unspecified: Secondary | ICD-10-CM | POA: Diagnosis not present

## 2022-02-22 DIAGNOSIS — R509 Fever, unspecified: Secondary | ICD-10-CM | POA: Diagnosis not present

## 2022-02-22 DIAGNOSIS — J449 Chronic obstructive pulmonary disease, unspecified: Secondary | ICD-10-CM | POA: Diagnosis not present

## 2022-02-22 DIAGNOSIS — R5383 Other fatigue: Secondary | ICD-10-CM | POA: Diagnosis not present

## 2022-02-22 DIAGNOSIS — E7800 Pure hypercholesterolemia, unspecified: Secondary | ICD-10-CM | POA: Diagnosis not present

## 2022-02-22 DIAGNOSIS — R6889 Other general symptoms and signs: Secondary | ICD-10-CM | POA: Diagnosis not present

## 2022-02-22 DIAGNOSIS — F17200 Nicotine dependence, unspecified, uncomplicated: Secondary | ICD-10-CM | POA: Diagnosis not present

## 2022-02-22 HISTORY — DX: Chronic obstructive pulmonary disease, unspecified: J44.9

## 2022-02-22 LAB — RESP PANEL BY RT-PCR (RSV, FLU A&B, COVID)  RVPGX2
Influenza A by PCR: NEGATIVE
Influenza B by PCR: NEGATIVE
Resp Syncytial Virus by PCR: POSITIVE — AB
SARS Coronavirus 2 by RT PCR: NEGATIVE

## 2022-02-22 MED ORDER — PREDNISONE 50 MG PO TABS
60.0000 mg | ORAL_TABLET | Freq: Once | ORAL | Status: AC
  Administered 2022-02-22: 60 mg via ORAL
  Filled 2022-02-22: qty 1

## 2022-02-22 MED ORDER — PREDNISONE 20 MG PO TABS: 60.0000 mg | ORAL_TABLET | Freq: Every day | ORAL | 0 refills | Status: AC

## 2022-02-22 MED ORDER — ALBUTEROL SULFATE HFA 108 (90 BASE) MCG/ACT IN AERS: 3.0000 | INHALATION_SPRAY | Freq: Once | RESPIRATORY_TRACT | Status: DC

## 2022-02-22 MED ORDER — ALBUTEROL SULFATE HFA 108 (90 BASE) MCG/ACT IN AERS: 4.0000 | INHALATION_SPRAY | Freq: Once | RESPIRATORY_TRACT | Status: DC

## 2022-02-22 MED ORDER — ALBUTEROL SULFATE HFA 108 (90 BASE) MCG/ACT IN AERS
2.0000 | INHALATION_SPRAY | Freq: Once | RESPIRATORY_TRACT | Status: AC
  Administered 2022-02-23: 2 via RESPIRATORY_TRACT
  Filled 2022-02-22: qty 6.7

## 2022-02-22 MED ORDER — DOXYCYCLINE HYCLATE 100 MG PO CAPS: 100.0000 mg | ORAL_CAPSULE | Freq: Two times a day (BID) | ORAL | 0 refills | Status: AC

## 2022-02-22 MED ORDER — DOXYCYCLINE HYCLATE 100 MG PO TABS
100.0000 mg | ORAL_TABLET | Freq: Once | ORAL | Status: AC
  Administered 2022-02-22: 100 mg via ORAL
  Filled 2022-02-22: qty 1

## 2022-02-22 MED ORDER — ACETAMINOPHEN 325 MG PO TABS
650.0000 mg | ORAL_TABLET | Freq: Once | ORAL | Status: AC
  Administered 2022-02-22: 650 mg via ORAL
  Filled 2022-02-22: qty 2

## 2022-02-22 MED ORDER — IPRATROPIUM-ALBUTEROL 0.5-2.5 (3) MG/3ML IN SOLN
3.0000 mL | RESPIRATORY_TRACT | Status: AC
  Administered 2022-02-22: 3 mL via RESPIRATORY_TRACT
  Filled 2022-02-22: qty 3

## 2022-02-22 NOTE — ED Provider Notes (Signed)
[facility] EMERGENCY DEPARTMENT Provider Note   CSN: [id number] Arrival date & time: 02/22/22  1718     History {Add pertinent medical, surgical, social history, OB history to HPI:1} Chief Complaint  Patient presents with   URI    [name] is a 71 y.o. female.  Today difficulty breathing Copd no home O2 or intubations Cough dry and SOB Roomie flu No chest pain Out of her inhalers. Ran out today.        Home Medications Prior to Admission medications   Medication Sig Start Date End Date Taking? Authorizing Provider  acetaminophen (TYLENOL) 500 MG tablet Take 2 tablets (1,000 mg total) by mouth every 6 (six) hours as needed for mild pain. 11/15/20   Daleen Bo, MD  buprenorphine (SUBUTEX) 2 MG SUBL SL tablet Place 2 mg under the tongue 3 (three) times daily. 04/15/20   [provider]  carvedilol (COREG) 6.25 MG tablet Take 1 tablet (6.25 mg total) by mouth 2 (two) times daily with a meal. 11/15/20   Daleen Bo, MD  clindamycin (CLEOCIN) 300 MG capsule Take 1 capsule (300 mg total) by mouth 3 (three) times daily. 07/21/21   Prosperi, Christian H, PA-C  doxepin (SINEQUAN) 50 MG capsule Take 1 capsule (50 mg total) by mouth daily. 11/15/20   Daleen Bo, MD  LORazepam (ATIVAN) 0.5 MG tablet Take 1 tablet (0.5 mg total) by mouth 2 (two) times daily as needed for anxiety. 11/15/20   Daleen Bo, MD  methocarbamol (ROBAXIN) 500 MG tablet Take 1 tablet (500 mg total) by mouth every 8 (eight) hours as needed for muscle spasms. 02/20/21   Rancour, Annie Main, MD  OLANZapine (ZYPREXA) 10 MG tablet Take 1 tablet (10 mg total) by mouth at bedtime. 11/15/20   Daleen Bo, MD  temazepam (RESTORIL) 22.5 MG capsule Take 1 capsule (22.5 mg total) by mouth at bedtime. 11/15/20   Daleen Bo, MD      Allergies    Tylenol [acetaminophen], Codeine, Nsaids, Tolmetin, and Tramadol    Review of Systems   Review of Systems  Physical Exam Updated Vital Signs BP (!) 148/64  (BP Location: Left Arm)   Pulse 93   Temp 100.2 F (37.9 C) (Oral)   Resp 20   Ht '5\' 3"'$  (1.6 m)   Wt 46.3 kg   SpO2 92%   BMI 18.07 kg/m  Physical Exam Pulmonary:     Breath sounds: Rhonchi (BL) present.     ED Results / Procedures / Treatments   Labs (all labs ordered are listed, but only abnormal results are displayed) Labs Reviewed  RESP PANEL BY RT-PCR (RSV, FLU A&B, COVID)  RVPGX2 - Abnormal; Notable for the following components:      Result Value   Resp Syncytial Virus by PCR POSITIVE (*)    All other components within normal limits    EKG None  Radiology DG Chest 2 View  Result Date: 02/22/2022 CLINICAL DATA:  Cough, fever EXAM: CHEST - 2 VIEW COMPARISON:  10/30/2020 FINDINGS: Cardiac size is within normal limits. Increase in AP diameter of chest suggests COPD. There are no signs of pulmonary edema or focal pulmonary consolidation. Faint nodular densities in both lower lung fields most likely are nipple shadows. There is no pleural effusion or pneumothorax. IMPRESSION: COPD. There are no signs of pulmonary edema or focal pulmonary consolidation. Electronically Signed   By: Elmer Picker M.D.   On: 02/22/2022 18:57    Procedures Procedures  {Document cardiac monitor, telemetry  assessment procedure when appropriate:1}  Medications Ordered in ED Medications - No data to display  ED Course/ Medical Decision Making/ A&P                           Medical Decision Making Amount and/or Complexity of Data Reviewed Radiology: ordered.   ***  {Document critical care time when appropriate:1} {Document review of labs and clinical decision tools ie heart score, Chads2Vasc2 etc:1}  {Document your independent review of radiology images, and any outside records:1} {Document your discussion with family members, caretakers, and with consultants:1} {Document social determinants of health affecting pt's care:1} {Document your decision making why or why not admission,  treatments were needed:1} Final Clinical Impression(s) / ED Diagnoses Final diagnoses:  None    Rx / DC Orders ED Discharge Orders     None

## 2022-02-22 NOTE — Discharge Instructions (Signed)
You were seen for your upper respiratory tract infection and COPD exacerbation in the emergency department.   At home, please use your inhaler and take the prednisone and doxycycline that we prescribed you.    Check your MyChart online for the results of any tests that had not resulted by the time you left the emergency department.   Follow-up with your primary doctor in 2-3 days regarding your visit.    Return immediately to the emergency department if you experience any of the following: Difficulty breathing, or any other concerning symptoms.    Thank you for visiting our Emergency Department. It was a pleasure taking care of you today.

## 2022-02-22 NOTE — ED Triage Notes (Signed)
Pt states she was dx'd with URI today.  Pt with hx of COPD.   Pt mid back pain starting today.  +cough-NP per pt.

## 2022-02-23 DIAGNOSIS — J441 Chronic obstructive pulmonary disease with (acute) exacerbation: Secondary | ICD-10-CM | POA: Diagnosis not present

## 2022-02-23 MED ORDER — ONDANSETRON 4 MG PO TBDP
4.0000 mg | ORAL_TABLET | Freq: Once | ORAL | Status: AC
  Administered 2022-02-23: 4 mg via ORAL
  Filled 2022-02-23: qty 1

## 2022-02-23 NOTE — ED Notes (Signed)
  Spoke with Denman George at [facility] and they will send facility transport to pick up patient after they arrive at 0800.   Phone number to contact [facility] is [phone]

## 2022-02-23 NOTE — ED Notes (Signed)
Pt ambulated out of facility w/caregiver. Steady gait.

## 2022-02-23 NOTE — ED Notes (Signed)
This RN assumed care for this pt.   Pt resting comfortably in room @ this time, updated pt on when her ride will be here. Verbalized understanding.

## 2022-02-26 DIAGNOSIS — Z79899 Other long term (current) drug therapy: Secondary | ICD-10-CM | POA: Diagnosis not present

## 2022-02-27 ENCOUNTER — Telehealth: Payer: Self-pay

## 2022-02-27 ENCOUNTER — Encounter (HOSPITAL_BASED_OUTPATIENT_CLINIC_OR_DEPARTMENT_OTHER): Payer: Medicare Other | Admitting: Physician Assistant

## 2022-02-27 DIAGNOSIS — Z681 Body mass index (BMI) 19 or less, adult: Secondary | ICD-10-CM | POA: Diagnosis not present

## 2022-02-27 DIAGNOSIS — E43 Unspecified severe protein-calorie malnutrition: Secondary | ICD-10-CM | POA: Diagnosis not present

## 2022-02-27 DIAGNOSIS — L89154 Pressure ulcer of sacral region, stage 4: Secondary | ICD-10-CM | POA: Diagnosis not present

## 2022-02-27 DIAGNOSIS — M6281 Muscle weakness (generalized): Secondary | ICD-10-CM | POA: Diagnosis not present

## 2022-02-27 DIAGNOSIS — L89153 Pressure ulcer of sacral region, stage 3: Secondary | ICD-10-CM | POA: Diagnosis not present

## 2022-02-27 NOTE — Telephone Encounter (Signed)
        Patient  visited [place] on 12/16    Telephone encounter attempt :  1st  Unable to leave a message   [place], [facility]  [phone] [address], [place], [place] [address] Phone: [phone] Email: [email or web address]

## 2022-02-28 ENCOUNTER — Telehealth: Payer: Self-pay

## 2022-02-28 ENCOUNTER — Other Ambulatory Visit: Payer: Self-pay

## 2022-02-28 ENCOUNTER — Emergency Department (HOSPITAL_COMMUNITY): Payer: Medicare Other

## 2022-02-28 ENCOUNTER — Inpatient Hospital Stay (HOSPITAL_COMMUNITY)
Admission: EM | Admit: 2022-02-28 | Discharge: 2022-03-08 | DRG: 202 | Disposition: A | Discharge status: Home Health | Payer: Medicare Other | Source: Skilled Nursing Facility | Attending: Internal Medicine | Admitting: Internal Medicine

## 2022-02-28 ENCOUNTER — Encounter (HOSPITAL_COMMUNITY): Payer: Self-pay

## 2022-02-28 DIAGNOSIS — Z1152 Encounter for screening for COVID-19: Secondary | ICD-10-CM

## 2022-02-28 DIAGNOSIS — R062 Wheezing: Secondary | ICD-10-CM | POA: Diagnosis not present

## 2022-02-28 DIAGNOSIS — J210 Acute bronchiolitis due to respiratory syncytial virus: Principal | ICD-10-CM | POA: Diagnosis present

## 2022-02-28 DIAGNOSIS — Z90710 Acquired absence of both cervix and uterus: Secondary | ICD-10-CM | POA: Diagnosis not present

## 2022-02-28 DIAGNOSIS — J690 Pneumonitis due to inhalation of food and vomit: Secondary | ICD-10-CM | POA: Diagnosis not present

## 2022-02-28 DIAGNOSIS — Z79899 Other long term (current) drug therapy: Secondary | ICD-10-CM

## 2022-02-28 DIAGNOSIS — K219 Gastro-esophageal reflux disease without esophagitis: Secondary | ICD-10-CM | POA: Diagnosis not present

## 2022-02-28 DIAGNOSIS — I10 Essential (primary) hypertension: Secondary | ICD-10-CM | POA: Diagnosis present

## 2022-02-28 DIAGNOSIS — Z820 Family history of epilepsy and other diseases of the nervous system: Secondary | ICD-10-CM

## 2022-02-28 DIAGNOSIS — F17210 Nicotine dependence, cigarettes, uncomplicated: Secondary | ICD-10-CM | POA: Diagnosis not present

## 2022-02-28 DIAGNOSIS — F419 Anxiety disorder, unspecified: Secondary | ICD-10-CM | POA: Diagnosis present

## 2022-02-28 DIAGNOSIS — Z8249 Family history of ischemic heart disease and other diseases of the circulatory system: Secondary | ICD-10-CM

## 2022-02-28 DIAGNOSIS — Z66 Do not resuscitate: Secondary | ICD-10-CM | POA: Diagnosis not present

## 2022-02-28 DIAGNOSIS — H1089 Other conjunctivitis: Secondary | ICD-10-CM | POA: Diagnosis present

## 2022-02-28 DIAGNOSIS — R0600 Dyspnea, unspecified: Secondary | ICD-10-CM | POA: Diagnosis not present

## 2022-02-28 DIAGNOSIS — M797 Fibromyalgia: Secondary | ICD-10-CM | POA: Diagnosis present

## 2022-02-28 DIAGNOSIS — J9601 Acute respiratory failure with hypoxia: Secondary | ICD-10-CM | POA: Diagnosis present

## 2022-02-28 DIAGNOSIS — Z743 Need for continuous supervision: Secondary | ICD-10-CM | POA: Diagnosis not present

## 2022-02-28 DIAGNOSIS — G894 Chronic pain syndrome: Secondary | ICD-10-CM | POA: Diagnosis not present

## 2022-02-28 DIAGNOSIS — R636 Underweight: Secondary | ICD-10-CM | POA: Diagnosis present

## 2022-02-28 DIAGNOSIS — J439 Emphysema, unspecified: Secondary | ICD-10-CM | POA: Diagnosis not present

## 2022-02-28 DIAGNOSIS — J440 Chronic obstructive pulmonary disease with acute lower respiratory infection: Secondary | ICD-10-CM | POA: Diagnosis present

## 2022-02-28 DIAGNOSIS — Z8673 Personal history of transient ischemic attack (TIA), and cerebral infarction without residual deficits: Secondary | ICD-10-CM | POA: Diagnosis not present

## 2022-02-28 DIAGNOSIS — Z8042 Family history of malignant neoplasm of prostate: Secondary | ICD-10-CM | POA: Diagnosis not present

## 2022-02-28 DIAGNOSIS — R0602 Shortness of breath: Secondary | ICD-10-CM | POA: Diagnosis not present

## 2022-02-28 DIAGNOSIS — Z7951 Long term (current) use of inhaled steroids: Secondary | ICD-10-CM | POA: Diagnosis not present

## 2022-02-28 DIAGNOSIS — J441 Chronic obstructive pulmonary disease with (acute) exacerbation: Secondary | ICD-10-CM | POA: Diagnosis not present

## 2022-02-28 DIAGNOSIS — R6889 Other general symptoms and signs: Secondary | ICD-10-CM | POA: Diagnosis not present

## 2022-02-28 DIAGNOSIS — R509 Fever, unspecified: Secondary | ICD-10-CM | POA: Diagnosis not present

## 2022-02-28 DIAGNOSIS — R059 Cough, unspecified: Secondary | ICD-10-CM | POA: Diagnosis not present

## 2022-02-28 LAB — PROCALCITONIN: Procalcitonin: <0.1 ng/mL

## 2022-02-28 LAB — CBC WITH DIFFERENTIAL/PLATELET
Abs Immature Granulocytes: 0 10*3/uL (ref 0.00–0.07)
Basophils Absolute: 0 10*3/uL (ref 0.0–0.1)
Basophils Relative: 0 %
Eosinophils Absolute: 0 10*3/uL (ref 0.0–0.5)
Eosinophils Relative: 0 %
HCT: 46.3 % — ABNORMAL HIGH (ref 36.0–46.0)
Hemoglobin: 14.8 g/dL (ref 12.0–15.0)
Lymphocytes Relative: 13 %
Lymphs Abs: 1.9 10*3/uL (ref 0.7–4.0)
MCH: 29.8 pg (ref 26.0–34.0)
MCHC: 32 g/dL (ref 30.0–36.0)
MCV: 93.3 fL (ref 80.0–100.0)
Monocytes Absolute: 0.9 10*3/uL (ref 0.1–1.0)
Monocytes Relative: 6 %
Neutro Abs: 11.6 10*3/uL — ABNORMAL HIGH (ref 1.7–7.7)
Neutrophils Relative %: 81 %
Platelets: 238 10*3/uL (ref 150–400)
RBC: 4.96 MIL/uL (ref 3.87–5.11)
RDW: 14.4 % (ref 11.5–15.5)
WBC: 14.3 10*3/uL — ABNORMAL HIGH (ref 4.0–10.5)
nRBC: 0 % (ref 0.0–0.2)

## 2022-02-28 LAB — COMPREHENSIVE METABOLIC PANEL
ALT: 27 U/L (ref 0–44)
AST: 29 U/L (ref 15–41)
Albumin: 3.9 g/dL (ref 3.5–5.0)
Alkaline Phosphatase: 81 U/L (ref 38–126)
Anion gap: 12 (ref 5–15)
BUN: 30 mg/dL — ABNORMAL HIGH (ref 8–23)
CO2: 30 mmol/L (ref 22–32)
Calcium: 9.7 mg/dL (ref 8.9–10.3)
Chloride: 102 mmol/L (ref 98–111)
Creatinine, Ser: 0.71 mg/dL (ref 0.44–1.00)
GFR, Estimated: >60 mL/min (ref >60)
Glucose, Bld: 113 mg/dL — ABNORMAL HIGH (ref 70–99)
Potassium: 3.9 mmol/L (ref 3.5–5.1)
Sodium: 144 mmol/L (ref 135–145)
Total Bilirubin: 0.7 mg/dL (ref 0.3–1.2)
Total Protein: 7.1 g/dL (ref 6.5–8.1)

## 2022-02-28 LAB — HIV ANTIBODY (ROUTINE TESTING W REFLEX): HIV Screen 4th Generation wRfx: NONREACTIVE

## 2022-02-28 LAB — RESP PANEL BY RT-PCR (RSV, FLU A&B, COVID)  RVPGX2
Influenza A by PCR: NEGATIVE
Influenza B by PCR: NEGATIVE
Resp Syncytial Virus by PCR: POSITIVE — AB
SARS Coronavirus 2 by RT PCR: NEGATIVE

## 2022-02-28 MED ORDER — METHYLPREDNISOLONE SODIUM SUCC 125 MG IJ SOLR
125.0000 mg | Freq: Once | INTRAMUSCULAR | Status: AC
  Administered 2022-02-28: 125 mg via INTRAVENOUS
  Filled 2022-02-28: qty 2

## 2022-02-28 MED ORDER — MAGNESIUM SULFATE 2 GM/50ML IV SOLN
2.0000 g | Freq: Once | INTRAVENOUS | Status: AC
  Administered 2022-02-28: 2 g via INTRAVENOUS
  Filled 2022-02-28: qty 50

## 2022-02-28 MED ORDER — IPRATROPIUM-ALBUTEROL 0.5-2.5 (3) MG/3ML IN SOLN
3.0000 mL | RESPIRATORY_TRACT | Status: DC | PRN
  Administered 2022-03-06: 3 mL via RESPIRATORY_TRACT
  Filled 2022-02-28 (×2): qty 3

## 2022-02-28 MED ORDER — GUAIFENESIN ER 600 MG PO TB12
1200.0000 mg | ORAL_TABLET | Freq: Two times a day (BID) | ORAL | Status: DC
  Administered 2022-02-28: 1200 mg via ORAL
  Filled 2022-02-28: qty 2

## 2022-02-28 MED ORDER — ENOXAPARIN SODIUM 30 MG/0.3ML IJ SOSY
30.0000 mg | PREFILLED_SYRINGE | INTRAMUSCULAR | Status: DC
  Administered 2022-02-28 – 2022-03-07 (×8): 30 mg via SUBCUTANEOUS
  Filled 2022-02-28 (×8): qty 0.3

## 2022-02-28 MED ORDER — ARFORMOTEROL TARTRATE 15 MCG/2ML IN NEBU
15.0000 ug | INHALATION_SOLUTION | Freq: Two times a day (BID) | RESPIRATORY_TRACT | Status: DC
  Administered 2022-02-28 – 2022-03-07 (×13): 15 ug via RESPIRATORY_TRACT
  Filled 2022-02-28 (×13): qty 2

## 2022-02-28 MED ORDER — ACETAMINOPHEN 650 MG RE SUPP: 650.0000 mg | Freq: Four times a day (QID) | RECTAL | Status: DC | PRN

## 2022-02-28 MED ORDER — BUPRENORPHINE HCL 2 MG SL SUBL: 4.0000 mg | SUBLINGUAL_TABLET | Freq: Two times a day (BID) | SUBLINGUAL | Status: DC | PRN

## 2022-02-28 MED ORDER — SODIUM CHLORIDE 0.9% FLUSH
3.0000 mL | Freq: Two times a day (BID) | INTRAVENOUS | Status: DC
  Administered 2022-02-28 – 2022-03-08 (×15): 3 mL via INTRAVENOUS

## 2022-02-28 MED ORDER — MIRTAZAPINE 15 MG PO TABS
7.5000 mg | ORAL_TABLET | Freq: Every day | ORAL | Status: DC
  Administered 2022-02-28 – 2022-03-07 (×8): 7.5 mg via ORAL
  Filled 2022-02-28 (×8): qty 1

## 2022-02-28 MED ORDER — DONEPEZIL HCL 5 MG PO TABS
10.0000 mg | ORAL_TABLET | Freq: Every day | ORAL | Status: DC
  Administered 2022-02-28 – 2022-03-08 (×9): 10 mg via ORAL
  Filled 2022-02-28 (×9): qty 2

## 2022-02-28 MED ORDER — LORAZEPAM 0.5 MG PO TABS
0.5000 mg | ORAL_TABLET | Freq: Two times a day (BID) | ORAL | Status: DC | PRN
  Administered 2022-02-28: 0.5 mg via ORAL
  Filled 2022-02-28: qty 1

## 2022-02-28 MED ORDER — IPRATROPIUM-ALBUTEROL 0.5-2.5 (3) MG/3ML IN SOLN
3.0000 mL | Freq: Four times a day (QID) | RESPIRATORY_TRACT | Status: DC
  Administered 2022-02-28 – 2022-03-05 (×19): 3 mL via RESPIRATORY_TRACT
  Filled 2022-02-28 (×19): qty 3

## 2022-02-28 MED ORDER — BUDESONIDE 0.5 MG/2ML IN SUSP
0.5000 mg | Freq: Two times a day (BID) | RESPIRATORY_TRACT | Status: DC
  Administered 2022-02-28 – 2022-03-07 (×14): 0.5 mg via RESPIRATORY_TRACT
  Filled 2022-02-28 (×15): qty 2

## 2022-02-28 MED ORDER — POLYETHYLENE GLYCOL 3350 17 G PO PACK: 17.0000 g | PACK | Freq: Every day | ORAL | Status: DC | PRN

## 2022-02-28 MED ORDER — OLANZAPINE 5 MG PO TABS
10.0000 mg | ORAL_TABLET | Freq: Every day | ORAL | Status: DC
  Administered 2022-02-28 – 2022-03-07 (×8): 10 mg via ORAL
  Filled 2022-02-28 (×9): qty 2

## 2022-02-28 MED ORDER — ACETAMINOPHEN 325 MG PO TABS
650.0000 mg | ORAL_TABLET | ORAL | Status: DC | PRN
  Administered 2022-02-28 – 2022-03-08 (×4): 650 mg via ORAL
  Filled 2022-02-28 (×4): qty 2

## 2022-02-28 MED ORDER — TEMAZEPAM 7.5 MG PO CAPS
22.5000 mg | ORAL_CAPSULE | Freq: Every day | ORAL | Status: DC
  Administered 2022-03-01 – 2022-03-07 (×8): 22.5 mg via ORAL
  Filled 2022-02-28 (×10): qty 1

## 2022-02-28 MED ORDER — ARIPIPRAZOLE 5 MG PO TABS
5.0000 mg | ORAL_TABLET | Freq: Every day | ORAL | Status: DC
  Administered 2022-03-01 – 2022-03-08 (×8): 5 mg via ORAL
  Filled 2022-02-28 (×8): qty 1

## 2022-02-28 MED ORDER — METHYLPREDNISOLONE SODIUM SUCC 40 MG IJ SOLR
40.0000 mg | Freq: Two times a day (BID) | INTRAMUSCULAR | Status: DC
  Administered 2022-03-01 – 2022-03-08 (×15): 40 mg via INTRAVENOUS
  Filled 2022-02-28 (×15): qty 1

## 2022-02-28 MED ORDER — DOXEPIN HCL 25 MG PO CAPS
50.0000 mg | ORAL_CAPSULE | Freq: Every day | ORAL | Status: DC
  Administered 2022-02-28 – 2022-03-08 (×9): 50 mg via ORAL
  Filled 2022-02-28 (×4): qty 2
  Filled 2022-02-28: qty 5
  Filled 2022-02-28 (×5): qty 2

## 2022-02-28 NOTE — Assessment & Plan Note (Addendum)
-  Due to ongoing RSV infection and COPD exacerbation - Continue to wean oxygen supplementation as tolerated -Continue treatment for COPD exacerbation as mentioned.

## 2022-02-28 NOTE — ED Notes (Signed)
Pt placed on 2L [place], sats improved to 92%

## 2022-02-28 NOTE — ED Notes (Signed)
Pt ambulatory to restroom

## 2022-02-28 NOTE — Telephone Encounter (Signed)
        Patient  visited [place] on 12/16    Telephone encounter attempt :  2nd  A HIPAA compliant voice message was left requesting a return call.  Instructed patient to call back .    [place], [facility]  [phone] [address], [place], [place] [address] Phone: [phone] Email: [email or web address]

## 2022-02-28 NOTE — Assessment & Plan Note (Addendum)
-   Continue treatment with Subutex.

## 2022-02-28 NOTE — Assessment & Plan Note (Addendum)
-   Stable mood -Continue as needed benzodiazepines.

## 2022-02-28 NOTE — ED Provider Notes (Signed)
[facility] EMERGENCY DEPARTMENT Provider Note   CSN: [id number] Arrival date & time: 02/28/22  1204     History Chief Complaint  Patient presents with   RSV+   Shortness of Breath    HPI [name] is a 71 y.o. female presenting for chief complaint of shortness of breath.  Was being evaluated in her nursing facility today when she was acutely dyspneic. Found to be satting 88%.  EMS was called.  Diagnosed with RSV 6 days ago.  History of COPD.  On room air at baseline.  Feels increased pain production and worsening shortness of breath today.  Feels that she cannot catch her breath.  Treated with Solu-Medrol, DuoNebs with EMS with mild improvement.   Patient's recorded medical, surgical, social, medication list and allergies were reviewed in the Snapshot window as part of the initial history.   Review of Systems   Review of Systems  Constitutional:  Positive for fatigue. Negative for chills and fever.  HENT:  Negative for ear pain and sore throat.   Eyes:  Negative for pain and visual disturbance.  Respiratory:  Positive for cough and shortness of breath.   Cardiovascular:  Negative for chest pain and palpitations.  Gastrointestinal:  Negative for abdominal pain and vomiting.  Genitourinary:  Negative for dysuria and hematuria.  Musculoskeletal:  Negative for arthralgias and back pain.  Skin:  Negative for color change and rash.  Neurological:  Negative for seizures and syncope.  All other systems reviewed and are negative.   Physical Exam Updated Vital Signs BP (!) 128/56   Pulse 77   Temp 98.2 F (36.8 C) (Oral)   Resp 17   Ht '5\' 3"'$  (1.6 m)   Wt 46.3 kg   SpO2 93%   BMI 18.07 kg/m  Physical Exam Vitals and nursing note reviewed.  Constitutional:      General: She is not in acute distress.    Appearance: She is well-developed.  HENT:     Head: Normocephalic and atraumatic.  Eyes:     Conjunctiva/sclera: Conjunctivae normal.  Cardiovascular:     Rate  and Rhythm: Normal rate and regular rhythm.     Heart sounds: No murmur heard. Pulmonary:     Effort: Pulmonary effort is normal. Tachypnea present. No respiratory distress.     Breath sounds: Decreased breath sounds present.  Abdominal:     General: There is no distension.     Palpations: Abdomen is soft.     Tenderness: There is no abdominal tenderness. There is no right CVA tenderness or left CVA tenderness.  Musculoskeletal:        General: No swelling or tenderness. Normal range of motion.     Cervical back: Neck supple.  Skin:    General: Skin is warm and dry.  Neurological:     General: No focal deficit present.     Mental Status: She is alert and oriented to person, place, and time. Mental status is at baseline.     Cranial Nerves: No cranial nerve deficit.      ED Course/ Medical Decision Making/ A&P    Procedures Procedures   Medications Ordered in ED Medications  magnesium sulfate IVPB 2 g 50 mL (2 g Intravenous New Bag/Given 02/28/22 1348)  ipratropium-albuterol (DUONEB) 0.5-2.5 (3) MG/3ML nebulizer solution 3 mL (has no administration in time range)  ipratropium-albuterol (DUONEB) 0.5-2.5 (3) MG/3ML nebulizer solution 3 mL (has no administration in time range)  budesonide (PULMICORT) nebulizer solution 0.5 mg (has  no administration in time range)  arformoterol (BROVANA) nebulizer solution 15 mcg (has no administration in time range)  methylPREDNISolone sodium succinate (SOLU-MEDROL) 125 mg/2 mL injection 125 mg (has no administration in time range)  methylPREDNISolone sodium succinate (SOLU-MEDROL) 40 mg/mL injection 40 mg (has no administration in time range)   Medical Decision Making:   [name] is a 71 y.o. female with a history of COPD, who presented to the ED today with acute on chronic SOB. They are endorsing worsening of their baseline dyspnea over the past 1 hours. Their baseline is a 2L O2 requirement. At their baseline they are able to get around  the neighborhood and they are not able to at this time.   On my initial exam, the pt was SOB and tachypneic. Audible wheezing and grossly decreased breath sounds appreciated.  They are endorsing increased sputum production.    Reviewed and confirmed nursing documentation for past medical history, family history, social history.    Initial Assessment:   With the patient's presentation of SOB in the above setting, most likely diagnosis is COPD Exacerbation. Other diagnoses were considered including (but not limited to) CAP, PE, ACS, viral infection, PTX. These are considered less likely due to history of present illness and physical exam findings.   This is most consistent with an acute life/limb threatening illness complicated by underlying chronic conditions.  Initial Plan:  Empiric treatment of patient's symptoms with immediate initiation of inhaled bronchodilators and IV steroids. Given advanced nature of patient's presentation, will proceed with IV magnesium as a rescue therapy.   Evaluation for ACS with EKG and delta troponin  Evaluation for infectious versus intrathoracic abnormality with chest x-ray  Evaluation for volume overload with BNP  Screening labs including CBC and Metabolic panel to evaluate for infectious or metabolic etiology of disease.  Patient's Wells score is low and patient does not warrant further objective evaluation for PE based on consistency of presentation of alternative diagnosis.  Objective evaluation as below reviewed   Initial Study Results:   Laboratory  All laboratory results reviewed without evidence of clinically relevant pathology.    Radiology:  All images reviewed independently. Agree with radiology report at this time.   DG Chest Portable 1 View  Result Date: 02/28/2022 CLINICAL DATA:  RSV positive.  Cough and short of breath EXAM: PORTABLE CHEST 1 VIEW COMPARISON:  Chest 02/22/2022 FINDINGS: The heart size and mediastinal contours are within normal  limits. Both lungs are clear. The visualized skeletal structures are unremarkable. IMPRESSION: No active disease. Electronically Signed   By: Franchot Gallo M.D.   On: 02/28/2022 12:55   DG Chest 2 View  Result Date: 02/22/2022 CLINICAL DATA:  Cough, fever EXAM: CHEST - 2 VIEW COMPARISON:  10/30/2020 FINDINGS: Cardiac size is within normal limits. Increase in AP diameter of chest suggests COPD. There are no signs of pulmonary edema or focal pulmonary consolidation. Faint nodular densities in both lower lung fields most likely are nipple shadows. There is no pleural effusion or pneumothorax. IMPRESSION: COPD. There are no signs of pulmonary edema or focal pulmonary consolidation. Electronically Signed   By: Elmer Picker M.D.   On: 02/22/2022 18:57    Final Assessment and Plan:   After initiation of medical therapies, patient is grossly improved and no longer in acute distress.   Arranged for admission. Disposition:   Based on the above findings, I believe this patient is stable for admission.    Patient/family educated about specific findings on our  evaluation and explained exact reasons for admission.  Patient/family educated about clinical situation and time was allowed to answer questions.   Admission team communicated with and agreed with need for admission. Patient admitted. Patient ready to move at this time.     Emergency Department Medication Summary:   Medications  magnesium sulfate IVPB 2 g 50 mL (2 g Intravenous New Bag/Given 02/28/22 1348)  ipratropium-albuterol (DUONEB) 0.5-2.5 (3) MG/3ML nebulizer solution 3 mL (has no administration in time range)  ipratropium-albuterol (DUONEB) 0.5-2.5 (3) MG/3ML nebulizer solution 3 mL (has no administration in time range)  budesonide (PULMICORT) nebulizer solution 0.5 mg (has no administration in time range)  arformoterol (BROVANA) nebulizer solution 15 mcg (has no administration in time range)  methylPREDNISolone sodium succinate  (SOLU-MEDROL) 125 mg/2 mL injection 125 mg (has no administration in time range)  methylPREDNISolone sodium succinate (SOLU-MEDROL) 40 mg/mL injection 40 mg (has no administration in time range)    Clinical Impression:  1. COPD exacerbation ([place])      Admit   Final Clinical Impression(s) / ED Diagnoses Final diagnoses:  COPD exacerbation ([place])    Rx / DC Orders ED Discharge Orders     None         Tretha Sciara, MD 02/28/22 1454

## 2022-02-28 NOTE — ED Triage Notes (Signed)
Pt BIBA from [facility]. Pt was recently dx with RSV. Pt states that she was short of breath at facility. Pt was satting 89% on EMS arrival on room air. Pt was given a duoneb and solumedrol IM by EMS.

## 2022-02-28 NOTE — Progress Notes (Signed)
Health) 3 x Per Week/30 Days Discharge Instructions: Cleanse the wound with wound cleanser prior to applying a clean dressing using gauze sponges,  not tissue or cotton balls. Peri-Wound Care: Skin Prep (Home Health) 3 x Per Week/30 Days Discharge Instructions: Use skin prep as directed Prim Dressing: Hydrofera Blue Classic Foam Rope Dressing, 9x6 (mm/in) 3 x Per Week/30 Days ary Discharge Instructions: Moisten with saline prior to packing. May cut rope in half if having a hard time packing into wound. Secondary Dressing: Zetuvit Plus Silicone Border Dressing 4x4 (in/in) (Home Health) 3 x Per Week/30 Days Discharge Instructions: Apply silicone border over primary dressing as directed. Electronic Signature(s) Signed: 02/27/2022 5:39:23 PM By: [name] Signed: 02/27/2022 6:40:18 PM By: Worthy Keeler PA-C Entered By: [name] on 02/27/2022 11:06:16 -------------------------------------------------------------------------------- Problem List Details Patient Name: Date of Service: [name] 02/27/2022 11:00 A M Medical Record Number: [id number] Patient Account Number: [id number] Date of Birth/Sex: Treating RN: October 25, 1950 (71 y.o. F) Primary Care Provider: [name], [name] Other Clinician: Referring Provider: Treating Provider/Extender: [name] in Treatment: 8 Active Problems ICD-10 Encounter Code Description Active Date MDM Diagnosis M62.81 Muscle weakness (generalized) 01/02/2022 No Yes E43 Unspecified severe protein-calorie malnutrition 01/02/2022 No Yes L89.154 Pressure ulcer of sacral region, stage 4 01/02/2022 No Yes Inactive Problems Resolved Problems Electronic Signature(s) Signed: 02/27/2022 11:01:34 AM By: Worthy Keeler PA-C Entered By: Worthy Keeler on 02/27/2022 11:01:34 Romero Liner R ([id number]) [id number] Page 4 of 6 -------------------------------------------------------------------------------- Progress Note Details Patient Name: Date of Service: [name] 02/27/2022 11:00 A M Medical Record  Number: [id number] Patient Account Number: [id number] Date of Birth/Sex: Treating RN: 06-Jun-1950 (71 y.o. F) Primary Care Provider: [name], [name] Other Clinician: Referring Provider: Treating Provider/Extender: [name] in Treatment: 8 Subjective Chief Complaint Information obtained from Patient Pressure ulcer stage 3 Sacrum History of Present Illness (HPI) 01-02-22 upon evaluation today patient presents for initial inspection here in our clinic concerning a wound that she has over the sacral region. This is stated to be present since around the beginning of August 2023. She currently resides in a assisted nursing facility. She does seem to be able to answer questions fully today. Subsequently I do note that she is a little on the thin side but again other than the protein calorie malnutrition she is minimally weak but still does get up and move around some but she also tells me that she "sits a lot". She has not had any x-rays of the sacral region at this point. 01-09-2022 upon evaluation today patient's wound in the sacral area actually appears to be doing decently well. Fortunately I do not see any signs of infection which is great news and overall I am extremely pleased with where things stand today. 11/8; this is a patient who lives in some form of small assisted living in [place]. She has a deep wound over the lower part of her coccyx. An x-ray that we ordered from last time did not show any osseous abnormalities. We are supposed to be using Hydrofera Blue in the wound but I am really not sure what they are using to dress this and who is doing it. Talking to our staff they have apparently discussed this with the staff in the facility. She does not have home health. 01-23-2022 upon evaluation today patient appears to be doing decently well in regard to her wound. She has been  Health) 3 x Per Week/30 Days Discharge Instructions: Cleanse the wound with wound cleanser prior to applying a clean dressing using gauze sponges,  not tissue or cotton balls. Peri-Wound Care: Skin Prep (Home Health) 3 x Per Week/30 Days Discharge Instructions: Use skin prep as directed Prim Dressing: Hydrofera Blue Classic Foam Rope Dressing, 9x6 (mm/in) 3 x Per Week/30 Days ary Discharge Instructions: Moisten with saline prior to packing. May cut rope in half if having a hard time packing into wound. Secondary Dressing: Zetuvit Plus Silicone Border Dressing 4x4 (in/in) (Home Health) 3 x Per Week/30 Days Discharge Instructions: Apply silicone border over primary dressing as directed. Electronic Signature(s) Signed: 02/27/2022 5:39:23 PM By: [name] Signed: 02/27/2022 6:40:18 PM By: Worthy Keeler PA-C Entered By: [name] on 02/27/2022 11:06:16 -------------------------------------------------------------------------------- Problem List Details Patient Name: Date of Service: [name] 02/27/2022 11:00 A M Medical Record Number: [id number] Patient Account Number: [id number] Date of Birth/Sex: Treating RN: October 25, 1950 (71 y.o. F) Primary Care Provider: [name], [name] Other Clinician: Referring Provider: Treating Provider/Extender: [name] in Treatment: 8 Active Problems ICD-10 Encounter Code Description Active Date MDM Diagnosis M62.81 Muscle weakness (generalized) 01/02/2022 No Yes E43 Unspecified severe protein-calorie malnutrition 01/02/2022 No Yes L89.154 Pressure ulcer of sacral region, stage 4 01/02/2022 No Yes Inactive Problems Resolved Problems Electronic Signature(s) Signed: 02/27/2022 11:01:34 AM By: Worthy Keeler PA-C Entered By: Worthy Keeler on 02/27/2022 11:01:34 Romero Liner R ([id number]) [id number] Page 4 of 6 -------------------------------------------------------------------------------- Progress Note Details Patient Name: Date of Service: [name] 02/27/2022 11:00 A M Medical Record  Number: [id number] Patient Account Number: [id number] Date of Birth/Sex: Treating RN: 06-Jun-1950 (71 y.o. F) Primary Care Provider: [name], [name] Other Clinician: Referring Provider: Treating Provider/Extender: [name] in Treatment: 8 Subjective Chief Complaint Information obtained from Patient Pressure ulcer stage 3 Sacrum History of Present Illness (HPI) 01-02-22 upon evaluation today patient presents for initial inspection here in our clinic concerning a wound that she has over the sacral region. This is stated to be present since around the beginning of August 2023. She currently resides in a assisted nursing facility. She does seem to be able to answer questions fully today. Subsequently I do note that she is a little on the thin side but again other than the protein calorie malnutrition she is minimally weak but still does get up and move around some but she also tells me that she "sits a lot". She has not had any x-rays of the sacral region at this point. 01-09-2022 upon evaluation today patient's wound in the sacral area actually appears to be doing decently well. Fortunately I do not see any signs of infection which is great news and overall I am extremely pleased with where things stand today. 11/8; this is a patient who lives in some form of small assisted living in [place]. She has a deep wound over the lower part of her coccyx. An x-ray that we ordered from last time did not show any osseous abnormalities. We are supposed to be using Hydrofera Blue in the wound but I am really not sure what they are using to dress this and who is doing it. Talking to our staff they have apparently discussed this with the staff in the facility. She does not have home health. 01-23-2022 upon evaluation today patient appears to be doing decently well in regard to her wound. She has been  Health) 3 x Per Week/30 Days Discharge Instructions: Cleanse the wound with wound cleanser prior to applying a clean dressing using gauze sponges,  not tissue or cotton balls. Peri-Wound Care: Skin Prep (Home Health) 3 x Per Week/30 Days Discharge Instructions: Use skin prep as directed Prim Dressing: Hydrofera Blue Classic Foam Rope Dressing, 9x6 (mm/in) 3 x Per Week/30 Days ary Discharge Instructions: Moisten with saline prior to packing. May cut rope in half if having a hard time packing into wound. Secondary Dressing: Zetuvit Plus Silicone Border Dressing 4x4 (in/in) (Home Health) 3 x Per Week/30 Days Discharge Instructions: Apply silicone border over primary dressing as directed. Electronic Signature(s) Signed: 02/27/2022 5:39:23 PM By: [name] Signed: 02/27/2022 6:40:18 PM By: Worthy Keeler PA-C Entered By: [name] on 02/27/2022 11:06:16 -------------------------------------------------------------------------------- Problem List Details Patient Name: Date of Service: [name] 02/27/2022 11:00 A M Medical Record Number: [id number] Patient Account Number: [id number] Date of Birth/Sex: Treating RN: October 25, 1950 (71 y.o. F) Primary Care Provider: [name], [name] Other Clinician: Referring Provider: Treating Provider/Extender: [name] in Treatment: 8 Active Problems ICD-10 Encounter Code Description Active Date MDM Diagnosis M62.81 Muscle weakness (generalized) 01/02/2022 No Yes E43 Unspecified severe protein-calorie malnutrition 01/02/2022 No Yes L89.154 Pressure ulcer of sacral region, stage 4 01/02/2022 No Yes Inactive Problems Resolved Problems Electronic Signature(s) Signed: 02/27/2022 11:01:34 AM By: Worthy Keeler PA-C Entered By: Worthy Keeler on 02/27/2022 11:01:34 Romero Liner R ([id number]) [id number] Page 4 of 6 -------------------------------------------------------------------------------- Progress Note Details Patient Name: Date of Service: [name] 02/27/2022 11:00 A M Medical Record  Number: [id number] Patient Account Number: [id number] Date of Birth/Sex: Treating RN: 06-Jun-1950 (71 y.o. F) Primary Care Provider: [name], [name] Other Clinician: Referring Provider: Treating Provider/Extender: [name] in Treatment: 8 Subjective Chief Complaint Information obtained from Patient Pressure ulcer stage 3 Sacrum History of Present Illness (HPI) 01-02-22 upon evaluation today patient presents for initial inspection here in our clinic concerning a wound that she has over the sacral region. This is stated to be present since around the beginning of August 2023. She currently resides in a assisted nursing facility. She does seem to be able to answer questions fully today. Subsequently I do note that she is a little on the thin side but again other than the protein calorie malnutrition she is minimally weak but still does get up and move around some but she also tells me that she "sits a lot". She has not had any x-rays of the sacral region at this point. 01-09-2022 upon evaluation today patient's wound in the sacral area actually appears to be doing decently well. Fortunately I do not see any signs of infection which is great news and overall I am extremely pleased with where things stand today. 11/8; this is a patient who lives in some form of small assisted living in [place]. She has a deep wound over the lower part of her coccyx. An x-ray that we ordered from last time did not show any osseous abnormalities. We are supposed to be using Hydrofera Blue in the wound but I am really not sure what they are using to dress this and who is doing it. Talking to our staff they have apparently discussed this with the staff in the facility. She does not have home health. 01-23-2022 upon evaluation today patient appears to be doing decently well in regard to her wound. She has been  clean and does not look infected I do think that we need to really get this to fill-in and there is quite a bit of undermining that is to be considered here. 01-30-2022 upon evaluation today patient appears to be doing well currently in regard to her  wound which is looking pretty decent but still has quite a bit of space underneath as far as undermining is concerned. I think she might possibly be better with a wound VAC. With that being said if when I do this we probably only be able to do it 2 times a week at most. I discussed that with the patient today she is okay with that we just need to see if we can get the insurance approval and then of course the scheduling underway. 02-13-2022 upon evaluation today patient appears to be doing well currently in regard to her wound. She actually tells me that it is feeling a lot better which is good news. Fortunately I do not see any signs of active infection at this time. No fevers, chills, nausea, vomiting, or diarrhea. 02-27-2022 upon evaluation today patient appears to be doing well currently in regard to her wound. She is actually showing some signs of improvement we are CHANESE, HARTSOUGH R ([id number]) [id number] Page 2 of 6 still obtaining the approval for the snap VAC which I think could be beneficial in the meantime we been using the Hydrofera Blue rope. Electronic Signature(s) Signed: 02/27/2022 1:08:58 PM By: Worthy Keeler PA-C Entered By: Worthy Keeler on 02/27/2022 13:08:58 -------------------------------------------------------------------------------- Physical Exam Details Patient Name: Date of Service: [name] 02/27/2022 11:00 A M Medical Record Number: [id number] Patient Account Number: [id number] Date of Birth/Sex: Treating RN: Jul 06, 1950 (71 y.o. F) Primary Care Provider: [name], [name] Other Clinician: Referring Provider: Treating Provider/Extender: [name] in Treatment: 8 Constitutional Well-nourished and well-hydrated in no acute distress. Respiratory normal breathing without difficulty. Psychiatric this patient is able to make decisions and demonstrates good insight into disease process. Alert  and Oriented x 3. pleasant and cooperative. Notes Upon inspection patient's wound bed actually showed signs of good granulation epithelization at this point. Fortunately I do not see any evidence of active infection locally nor systemically which is great news. Electronic Signature(s) Signed: 02/27/2022 1:09:28 PM By: Worthy Keeler PA-C Entered By: Worthy Keeler on 02/27/2022 13:09:28 -------------------------------------------------------------------------------- Physician Orders Details Patient Name: Date of Service: [name] 02/27/2022 11:00 A M Medical Record Number: [id number] Patient Account Number: [id number] Date of Birth/Sex: Treating RN: 1950-07-19 (71 y.o. [name], [name] Primary Care Provider: [name], [name] Other Clinician: Referring Provider: Treating Provider/Extender: [name] in Treatment: 8 Verbal / Phone Orders: No Diagnosis Coding Follow-up Appointments ppointment in 2 weeks. Burman Blacksmith, [place] [facility] A Other: - Administrator Arbutus Ped) : [phone] ***Facility to change Monday, W[place], [place] next week*** Anesthetic (In clinic) Topical Lidocaine 5% applied to wound bed Bathing/ Shower/ Hygiene May shower with protection but do not get wound dressing(s) wet. - If dressing gets wet, please change right away as it can lead to infection/worsening of wound if not changed. ***Patient to only shower on days dressing gets changed (MOnday,Wednesday,Friday). She should shower just before dressing changes and immediately have dressing put back on after shower is doneQUNISHA, BRYK R ([id number]) [id number] Page 3 of 6 Off-Loading Turn and reposition every 2 hours Wound Treatment Wound #1 - Sacrum Cleanser: Wound Cleanser (Home  clean and does not look infected I do think that we need to really get this to fill-in and there is quite a bit of undermining that is to be considered here. 01-30-2022 upon evaluation today patient appears to be doing well currently in regard to her  wound which is looking pretty decent but still has quite a bit of space underneath as far as undermining is concerned. I think she might possibly be better with a wound VAC. With that being said if when I do this we probably only be able to do it 2 times a week at most. I discussed that with the patient today she is okay with that we just need to see if we can get the insurance approval and then of course the scheduling underway. 02-13-2022 upon evaluation today patient appears to be doing well currently in regard to her wound. She actually tells me that it is feeling a lot better which is good news. Fortunately I do not see any signs of active infection at this time. No fevers, chills, nausea, vomiting, or diarrhea. 02-27-2022 upon evaluation today patient appears to be doing well currently in regard to her wound. She is actually showing some signs of improvement we are CHANESE, HARTSOUGH R ([id number]) [id number] Page 2 of 6 still obtaining the approval for the snap VAC which I think could be beneficial in the meantime we been using the Hydrofera Blue rope. Electronic Signature(s) Signed: 02/27/2022 1:08:58 PM By: Worthy Keeler PA-C Entered By: Worthy Keeler on 02/27/2022 13:08:58 -------------------------------------------------------------------------------- Physical Exam Details Patient Name: Date of Service: [name] 02/27/2022 11:00 A M Medical Record Number: [id number] Patient Account Number: [id number] Date of Birth/Sex: Treating RN: Jul 06, 1950 (71 y.o. F) Primary Care Provider: [name], [name] Other Clinician: Referring Provider: Treating Provider/Extender: [name] in Treatment: 8 Constitutional Well-nourished and well-hydrated in no acute distress. Respiratory normal breathing without difficulty. Psychiatric this patient is able to make decisions and demonstrates good insight into disease process. Alert  and Oriented x 3. pleasant and cooperative. Notes Upon inspection patient's wound bed actually showed signs of good granulation epithelization at this point. Fortunately I do not see any evidence of active infection locally nor systemically which is great news. Electronic Signature(s) Signed: 02/27/2022 1:09:28 PM By: Worthy Keeler PA-C Entered By: Worthy Keeler on 02/27/2022 13:09:28 -------------------------------------------------------------------------------- Physician Orders Details Patient Name: Date of Service: [name] 02/27/2022 11:00 A M Medical Record Number: [id number] Patient Account Number: [id number] Date of Birth/Sex: Treating RN: 1950-07-19 (71 y.o. [name], [name] Primary Care Provider: [name], [name] Other Clinician: Referring Provider: Treating Provider/Extender: [name] in Treatment: 8 Verbal / Phone Orders: No Diagnosis Coding Follow-up Appointments ppointment in 2 weeks. Burman Blacksmith, [place] [facility] Other: - Administrator Arbutus Ped) : [phone] ***Facility to change Monday, [place], [place] next week*** Anesthetic (In clinic) Topical Lidocaine 5% applied to wound bed Bathing/ Shower/ Hygiene May shower with protection but do not get wound dressing(s) wet. - If dressing gets wet, please change right away as it can lead to infection/worsening of wound if not changed. ***Patient to only shower on days dressing gets changed (MOnday,Wednesday,Friday). She should shower just before dressing changes and immediately have dressing put back on after shower is doneQUNISHA, BRYK R ([id number]) [id number] Page 3 of 6 Off-Loading Turn and reposition every 2 hours Wound Treatment Wound #1 - Sacrum Cleanser: Wound Cleanser (Home  clean and does not look infected I do think that we need to really get this to fill-in and there is quite a bit of undermining that is to be considered here. 01-30-2022 upon evaluation today patient appears to be doing well currently in regard to her  wound which is looking pretty decent but still has quite a bit of space underneath as far as undermining is concerned. I think she might possibly be better with a wound VAC. With that being said if when I do this we probably only be able to do it 2 times a week at most. I discussed that with the patient today she is okay with that we just need to see if we can get the insurance approval and then of course the scheduling underway. 02-13-2022 upon evaluation today patient appears to be doing well currently in regard to her wound. She actually tells me that it is feeling a lot better which is good news. Fortunately I do not see any signs of active infection at this time. No fevers, chills, nausea, vomiting, or diarrhea. 02-27-2022 upon evaluation today patient appears to be doing well currently in regard to her wound. She is actually showing some signs of improvement we are CHANESE, HARTSOUGH R ([id number]) [id number] Page 2 of 6 still obtaining the approval for the snap VAC which I think could be beneficial in the meantime we been using the Hydrofera Blue rope. Electronic Signature(s) Signed: 02/27/2022 1:08:58 PM By: Worthy Keeler PA-C Entered By: Worthy Keeler on 02/27/2022 13:08:58 -------------------------------------------------------------------------------- Physical Exam Details Patient Name: Date of Service: [name] 02/27/2022 11:00 A M Medical Record Number: [id number] Patient Account Number: [id number] Date of Birth/Sex: Treating RN: Jul 06, 1950 (71 y.o. F) Primary Care Provider: [name], [name] Other Clinician: Referring Provider: Treating Provider/Extender: [name] in Treatment: 8 Constitutional Well-nourished and well-hydrated in no acute distress. Respiratory normal breathing without difficulty. Psychiatric this patient is able to make decisions and demonstrates good insight into disease process. Alert  and Oriented x 3. pleasant and cooperative. Notes Upon inspection patient's wound bed actually showed signs of good granulation epithelization at this point. Fortunately I do not see any evidence of active infection locally nor systemically which is great news. Electronic Signature(s) Signed: 02/27/2022 1:09:28 PM By: Worthy Keeler PA-C Entered By: Worthy Keeler on 02/27/2022 13:09:28 -------------------------------------------------------------------------------- Physician Orders Details Patient Name: Date of Service: [name] 02/27/2022 11:00 A M Medical Record Number: [id number] Patient Account Number: [id number] Date of Birth/Sex: Treating RN: 1950-07-19 (71 y.o. [name], [name] Primary Care Provider: [name], [name] Other Clinician: Referring Provider: Treating Provider/Extender: [name] in Treatment: 8 Verbal / Phone Orders: No Diagnosis Coding Follow-up Appointments ppointment in 2 weeks. Burman Blacksmith, [place] [facility] Other: - Administrator Ar[employer] Ped) : [phone] ***Facility to change Monday, [place], [place] next week*** Anesthetic (In clinic) Topical Lidocaine 5% applied to wound bed Bathing/ Shower/ Hygiene May shower with protection but do not get wound dressing(s) wet. - If dressing gets wet, please change right away as it can lead to infection/worsening of wound if not changed. ***Patient to only shower on days dressing gets changed (MOnday,Wednesday,Friday). She should shower just before dressing changes and immediately have dressing put back on after shower is doneQUNISHA, BRYK R ([id number]) [id number] Page 3 of 6 Off-Loading Turn and reposition every 2 hours Wound Treatment Wound #1 - Sacrum Cleanser: Wound Cleanser (Home

## 2022-02-28 NOTE — Assessment & Plan Note (Addendum)
-   Continue treatment with Subutex -Follow clinical response. -Continue holding tramadol currently.

## 2022-02-28 NOTE — Assessment & Plan Note (Addendum)
-  Blood pressure soft, but is stable -Continue to follow Guin antihypertensive agents currently.

## 2022-02-28 NOTE — H&P (Signed)
History and Physical    [name]  OBS:[id number]  DOB: 1951/02/28  DOA: 02/28/2022  PCP: [name], MD Patient coming from: ALF  Chief Complaint: SOB, cough  HPI:  [name] is a 71 yo female with PMH COPD, arthritis, diverticulosis, GERD, HTN, CVA who presented with worsening shortness of breath, ongoing hypoxia, and generalized malaise. She was previously seen in the ER on 02/22/2022 with cough and shortness of breath as well.  She tested positive for RSV at that time.  She was treated with breathing treatments and prescribed doxycycline and prednisone along with oxygen. Due to not feeling better over the past few days, she presented back. Testing is again positive for RSV.  She remains hypoxic requiring 2 L.  CXR did not show any obvious infiltrates or effusions. She is admitted for IV steroids and further treatment.   I have personally briefly reviewed patient's old medical records in [facility] and discussed patient with the ER provider when appropriate/indicated.  Assessment and Plan: * RSV (acute bronchiolitis due to respiratory syncytial virus) - still RSV positive; likely has ongoing COPD exac from RSV - start IV steroids - start scheduled and PRN nebs - continue O2  Acute respiratory failure with hypoxia ([place]) - Due to ongoing RSV infection and COPD exacerbation - Continue oxygen and wean as able - Continue steroids  Chronic pain syndrome - Database reviewed, continue buprenorphine  Fibromyalgia - Continue Subutex, hold tramadol.  Already concern for polypharmacy  Essential hypertension - BP soft, hold home meds for now  GERD (gastroesophageal reflux disease) - Hold any PPI for now unless becomes symptomatic  Anxiety - Database reviewed, continue Ativan per home dosage   Code Status: Personally discussed in ER.    Code Status: DNR  DVT Prophylaxis: Lovenox     Anticipated disposition is to: ALF  History: Past Medical  History:  Diagnosis Date   Acute blood loss anemia    Arrhythmia    Arthritis    C. difficile colitis    Cancer ([place])    COPD (chronic obstructive pulmonary disease) ([place])    Diverticulosis    Fibromyalgia    GERD (gastroesophageal reflux disease)    GI hemorrhage    H/O: substance abuse ([place]) 2005   narcotic usage due to chronic back pain   Heart murmur    Hypertension    Pancreatic cyst    Pneumococcal meningitis    PONV (postoperative nausea and vomiting)    Renal cyst    Stroke (cerebrum) [facility])     Past Surgical History:  Procedure Laterality Date   ABDOMINAL HYSTERECTOMY     ABDOMINAL SURGERY     APPENDECTOMY     BIOPSY  04/22/2020   Procedure: BIOPSY;  Surgeon: Milus Banister, MD;  Location: [facility];  Service: Gastroenterology;;   BREAST SURGERY     CHOLECYSTECTOMY N/A 09/30/2012   Procedure: LAPAROSCOPIC CHOLECYSTECTOMY WITH INTRAOPERATIVE CHOLANGIOGRAM;  Surgeon: Imogene Burn. Georgette Dover, MD;  Location: [facility];  Service: General;  Laterality: N/A;   ESOPHAGOGASTRODUODENOSCOPY (EGD) WITH PROPOFOL N/A 04/22/2020   Procedure: ESOPHAGOGASTRODUODENOSCOPY (EGD) WITH PROPOFOL;  Surgeon: Milus Banister, MD;  Location: [facility];  Service: Gastroenterology;  Laterality: N/A;   FRACTURE SURGERY     right upper arm   OVARIAN CYST SURGERY       reports that she has been smoking cigarettes. She has a 19.60 pack-year smoking history. She has never used smokeless tobacco. She reports that she does not  drink alcohol and does not use drugs.  Allergies  Allergen Reactions   Tylenol [Acetaminophen] Other (See Comments)    Inflammed Liver   Codeine Nausea And Vomiting   Nsaids Other (See Comments)    Stomach ulcers   Tolmetin Other (See Comments)    Stomach ulcers   Tramadol Other (See Comments)    GI UPSET    Family History  Problem Relation Age of Onset   Alzheimer's disease Mother    Heart disease Father    Prostate cancer Father    Home Medications: Prior to  Admission medications   Medication Sig Start Date End Date Taking? Authorizing Provider  acetaminophen (TYLENOL) 500 MG tablet Take 2 tablets (1,000 mg total) by mouth every 6 (six) hours as needed for mild pain. 11/15/20  Yes Daleen Bo, MD  albuterol (VENTOLIN HFA) 108 (90 Base) MCG/ACT inhaler Inhale 1-2 puffs into the lungs every 6 (six) hours as needed for wheezing or shortness of breath. 02/28/22  Yes [provider]  ARIPiprazole (ABILIFY) 5 MG tablet Take 5 mg by mouth daily. 02/14/22  Yes [provider]  buprenorphine (SUBUTEX) 2 MG SUBL SL tablet Place 4 mg under the tongue every 6 (six) hours as needed (pain). 04/15/20  Yes [provider]  carvedilol (COREG) 6.25 MG tablet Take 1 tablet (6.25 mg total) by mouth 2 (two) times daily with a meal. 11/15/20  Yes Daleen Bo, MD  DHEA 10 MG CAPS Take 1 capsule by mouth daily.   Yes [provider]  donepezil (ARICEPT) 10 MG tablet Take 10 mg by mouth daily. 02/15/22  Yes [provider]  doxepin (SINEQUAN) 50 MG capsule Take 1 capsule (50 mg total) by mouth daily. 11/15/20  Yes Daleen Bo, MD  GOODSENSE IBUPROFEN 200 MG tablet Take 200 mg by mouth daily. 02/14/22  Yes [provider]  Iodine Strong, Lug[place], (STRONG IODINE) 5 % solution Take 1 mL by mouth 3 (three) times a week. 12/10/21  Yes [provider]  LORazepam (ATIVAN) 0.5 MG tablet Take 1 tablet (0.5 mg total) by mouth 2 (two) times daily as needed for anxiety. 11/15/20  Yes Daleen Bo, MD  losartan (COZAAR) 100 MG tablet Take 100 mg by mouth daily. 02/15/22  Yes [provider]  mirtazapine (REMERON) 7.5 MG tablet Take 7.5 mg by mouth at bedtime. 02/20/22  Yes [provider]  naloxone (NARCAN) nasal spray 4 mg/0.1 mL Place 1 spray into the nose once. 12/04/21  Yes [provider]  Nutritional Supplements (ENSURE ORIGINAL) LIQD Take 1 Can by mouth in the morning, at noon, in the evening, and at  bedtime. 02/01/22  Yes [provider]  OLANZapine (ZYPREXA) 10 MG tablet Take 1 tablet (10 mg total) by mouth at bedtime. 11/15/20  Yes Daleen Bo, MD  temazepam (RESTORIL) 22.5 MG capsule Take 1 capsule (22.5 mg total) by mouth at bedtime. 11/15/20  Yes Daleen Bo, MD  traMADol (ULTRAM) 50 MG tablet Take 50 mg by mouth every 4 (four) hours as needed for moderate pain. 02/23/22  Yes [provider]  TRELEGY ELLIPTA 100-62.5-25 MCG/ACT AEPB Inhale 1 puff into the lungs daily. 02/28/22  Yes [provider]  VITAMIN A PO Take 1 capsule by mouth 3 (three) times a week.   Yes [provider]  Zinc Sulfate 220 (50 Zn) MG TABS Take 0.5 tablets by mouth 3 (three) times a week. 02/14/22  Yes [provider]    Review of Systems:  Review of Systems  Constitutional:  Positive for malaise/fatigue. Negative for chills and fever.  HENT: Negative.    Eyes: Negative.   Respiratory:  Positive for cough, shortness of breath and wheezing.   Cardiovascular: Negative.   Gastrointestinal: Negative.   Genitourinary: Negative.   Musculoskeletal: Negative.   Skin: Negative.   Neurological: Negative.   Endo/Heme/Allergies: Negative.   Psychiatric/Behavioral: Negative.      Physical Exam:  Vitals:   02/28/22 1400 02/28/22 1415 02/28/22 1430 02/28/22 1459  BP: (!) 118/43  (!) 128/56   Pulse:  71 77   Resp: '16 16 17   '$ Temp:      TempSrc:      SpO2:  91% 93% 94%  Weight:      Height:       Physical Exam Constitutional:      General: She is not in acute distress. HENT:     Head: Normocephalic and atraumatic.  Eyes:     Extraocular Movements: Extraocular movements intact.  Cardiovascular:     Rate and Rhythm: Normal rate and regular rhythm.  Pulmonary:     Comments: Diffuse coarse breath sounds bilaterally with wheezing appreciated Abdominal:     General: Bowel sounds are normal. There is no distension.     Palpations: Abdomen is soft.      Tenderness: There is no abdominal tenderness.  Musculoskeletal:        General: Normal range of motion.     Cervical back: Normal range of motion and neck supple.  Skin:    General: Skin is warm and dry.  Neurological:     Mental Status: She is alert. Mental status is at baseline.  Psychiatric:        Mood and Affect: Mood normal.      Labs on Admission:  I have personally reviewed following labs and imaging studies Results for orders placed or performed during the hospital encounter of 02/28/22 (from the past 24 hour(s))  Resp panel by RT-PCR (RSV, Flu A&B, Covid) Anterior Nasal Swab     Status: Abnormal   Collection Time: 02/28/22 12:35 PM   Specimen: Anterior Nasal Swab  Result Value Ref Range   SARS Coronavirus 2 by RT PCR NEGATIVE NEGATIVE   Influenza A by PCR NEGATIVE NEGATIVE   Influenza B by PCR NEGATIVE NEGATIVE   Resp Syncytial Virus by PCR POSITIVE (A) NEGATIVE  CBC with Differential     Status: Abnormal   Collection Time: 02/28/22 12:36 PM  Result Value Ref Range   WBC 14.3 (H) 4.0 - 10.5 K/uL   RBC 4.96 3.87 - 5.11 MIL/uL   Hemoglobin 14.8 12.0 - 15.0 g/dL   HCT 46.3 (H) 36.0 - 46.0 %   MCV 93.3 80.0 - 100.0 fL   MCH 29.8 26.0 - 34.0 pg   MCHC 32.0 30.0 - 36.0 g/dL   RDW 14.4 11.5 - 15.5 %   Platelets 238 150 - 400 K/uL   nRBC 0.0 0.0 - 0.2 %   Neutrophils Relative % 81 %   Neutro Abs 11.6 (H) 1.7 - 7.7 K/uL   Lymphocytes Relative 13 %   Lymphs Abs 1.9 0.7 - 4.0 K/uL   Monocytes Relative 6 %   Monocytes Absolute 0.9 0.1 - 1.0 K/uL   Eosinophils Relative 0 %   Eosinophils Absolute 0.0 0.0 - 0.5 K/uL   Basophils Relative 0 %   Basophils Absolute 0.0 0.0 - 0.1 K/uL   RBC Morphology MORPHOLOGY UNREMARKABLE    Smear  Review PLATELET COUNT CONFIRMED BY SMEAR    Abs Immature Granulocytes 0.00 0.00 - 0.07 K/uL   Reactive, Benign Lymphocytes PRESENT   Comprehensive metabolic panel     Status: Abnormal   Collection Time: 02/28/22 12:36 PM  Result Value Ref Range    Sodium 144 135 - 145 mmol/L   Potassium 3.9 3.5 - 5.1 mmol/L   Chloride 102 98 - 111 mmol/L   CO2 30 22 - 32 mmol/L   Glucose, Bld 113 (H) 70 - 99 mg/dL   BUN 30 (H) 8 - 23 mg/dL   Creatinine, Ser 0.71 0.44 - 1.00 mg/dL   Calcium 9.7 8.9 - 10.3 mg/dL   Total Protein 7.1 6.5 - 8.1 g/dL   Albumin 3.9 3.5 - 5.0 g/dL   AST 29 15 - 41 U/L   ALT 27 0 - 44 U/L   Alkaline Phosphatase 81 38 - 126 U/L   Total Bilirubin 0.7 0.3 - 1.2 mg/dL   GFR, Estimated >60 >60 mL/min   Anion gap 12 5 - 15  Blood culture (routine x 2)     Status: None (Preliminary result)   Collection Time: 02/28/22  2:04 PM   Specimen: BLOOD  Result Value Ref Range   Specimen Description BLOOD BLOOD RIGHT ARM    Special Requests      BOTTLES DRAWN AEROBIC AND ANAEROBIC Blood Culture adequate volume BLOOD RIGHT ARM Performed at [facility], [address], [place], [place] [address]    Culture PENDING    Report Status PENDING   Blood culture (routine x 2)     Status: None (Preliminary result)   Collection Time: 02/28/22  2:04 PM   Specimen: BLOOD  Result Value Ref Range   Specimen Description BLOOD BLOOD LEFT ARM    Special Requests      BOTTLES DRAWN AEROBIC AND ANAEROBIC Blood Culture adequate volume BLOOD LEFT ARM Performed at [facility], [address], [place], [place] [address]    Culture PENDING    Report Status PENDING      Radiological Exams on Admission: DG Chest Portable 1 View  Result Date: 02/28/2022 CLINICAL DATA:  RSV positive.  Cough and short of breath EXAM: PORTABLE CHEST 1 VIEW COMPARISON:  Chest 02/22/2022 FINDINGS: The heart size and mediastinal contours are within normal limits. Both lungs are clear. The visualized skeletal structures are unremarkable. IMPRESSION: No active disease. Electronically Signed   By: Franchot Gallo M.D.   On: 02/28/2022 12:55   DG Chest Portable 1 View  Final Result      Consults called:   none  EKG: Independently reviewed. NSR   Dwyane Dee,  MD [facility] 02/28/2022, 3:36 PM

## 2022-02-28 NOTE — Assessment & Plan Note (Addendum)
-   still RSV positive at time of admission. -Continue steroids, Pulmicort, Brovana, flutter valve and the use of mucolytic's/antitussive medication -Continue supportive care and follow clinical response. -Wean off oxygen supplementation as tolerated.

## 2022-02-28 NOTE — Assessment & Plan Note (Addendum)
Continue PPI ?

## 2022-02-28 NOTE — Hospital Course (Signed)
[name] is a 71 yo female with PMH COPD, arthritis, diverticulosis, GERD, HTN, CVA who presented with worsening shortness of breath, ongoing hypoxia, and generalized malaise. She was previously seen in the ER on 02/22/2022 with cough and shortness of breath as well.  She tested positive for RSV at that time.  She was treated with breathing treatments and prescribed doxycycline and prednisone along with oxygen. Due to not feeling better over the past few days, she presented back. Testing is again positive for RSV.  She remains hypoxic requiring 2 L.  CXR did not show any obvious infiltrates or effusions. She is admitted for IV steroids and further treatment.

## 2022-02-28 NOTE — ED Notes (Signed)
Attempted report, RN unable to take at this time.

## 2022-03-01 DIAGNOSIS — K219 Gastro-esophageal reflux disease without esophagitis: Secondary | ICD-10-CM

## 2022-03-01 DIAGNOSIS — J210 Acute bronchiolitis due to respiratory syncytial virus: Secondary | ICD-10-CM | POA: Diagnosis not present

## 2022-03-01 DIAGNOSIS — I10 Essential (primary) hypertension: Secondary | ICD-10-CM | POA: Diagnosis not present

## 2022-03-01 DIAGNOSIS — J441 Chronic obstructive pulmonary disease with (acute) exacerbation: Secondary | ICD-10-CM

## 2022-03-01 LAB — PROCALCITONIN: Procalcitonin: <0.1 ng/mL

## 2022-03-01 MED ORDER — DM-GUAIFENESIN ER 30-600 MG PO TB12
1.0000 | ORAL_TABLET | Freq: Two times a day (BID) | ORAL | Status: DC
  Administered 2022-03-01 – 2022-03-04 (×7): 1 via ORAL
  Filled 2022-03-01 (×7): qty 1

## 2022-03-01 MED ORDER — PANTOPRAZOLE SODIUM 40 MG PO TBEC
40.0000 mg | DELAYED_RELEASE_TABLET | Freq: Every day | ORAL | Status: DC
  Administered 2022-03-01 – 2022-03-08 (×8): 40 mg via ORAL
  Filled 2022-03-01 (×8): qty 1

## 2022-03-01 NOTE — TOC Initial Note (Signed)
Transition of Care [facility]) - Initial/Assessment Note    Patient Details  Name: [name] MRN: [id number] Date of Birth: 1950/11/03  Transition of Care [facility]) CM/SW Contact:    Iona Beard, [place] Phone Number: 03/01/2022, 11:54 AM  Clinical Narrative:                 CSW noted per chart review that pt arrived from [place]. CSW spoke to [place] with the facility to provide update. [facility]man George states they can accept pt back tomorrow if she is medically ready. If pt needs to go home with O2, they are accepting of [facility]. TOC to follow for home O2 needs. CSW started Fl2, completed and signed Fl2 can be faxed to [phone]. Yolanda asked that any new meds be sent to [facility] care. TOC to follow.   Expected Discharge Plan: Assisted Living Barriers to Discharge: Continued Medical Work up   Patient Goals and CMS Choice Patient states their goals for this hospitalization and ongoing recovery are:: return to ALF CMS Medicare.gov Compare Post Acute Care list provided to:: Patient Choice offered to / list presented to : Patient      Expected Discharge Plan and Services In-house Referral: Clinical Social Work Discharge Planning Services: CM Consult   Living arrangements for the past 2 months: [place]                                      Prior Living Arrangements/Services Living arrangements for the past 2 months: [place] Lives with:: Facility Resident Patient language and need for interpreter reviewed:: Yes Do you feel safe going back to the place where you live?: Yes      Need for Family Participation in Patient Care: Yes (Comment) Care giver support system in place?: Yes (comment)   Criminal Activity/Legal Involvement Pertinent to Current Situation/Hospitalization: No - Comment as needed  Activities of Daily Living Home Assistive Devices/Equipment: None ADL Screening (condition at time of admission) Patient's  cognitive ability adequate to safely complete daily activities?: Yes Is the patient deaf or have difficulty hearing?: No Does the patient have difficulty seeing, even when wearing glasses/contacts?: No Does the patient have difficulty concentrating, remembering, or making decisions?: Yes Patient able to express need for assistance with ADLs?: Yes Does the patient have difficulty dressing or bathing?: Yes Independently performs ADLs?: Yes (appropriate for developmental age) Does the patient have difficulty walking or climbing stairs?: No Weakness of Legs: None Weakness of Arms/Hands: None  Permission Sought/Granted                  Emotional Assessment Appearance:: Appears stated age       Alcohol / Substance Use: Not Applicable Psych Involvement: No (comment)  Admission diagnosis:  RSV (acute bronchiolitis due to respiratory syncytial virus) [J21.0] COPD exacerbation ([place]) [J44.1] Patient Active Problem List   Diagnosis Date Noted   RSV (acute bronchiolitis due to respiratory syncytial virus) 02/28/2022   Protein-calorie malnutrition, severe 08/03/2020   COPD with exacerbation ([place]) 08/02/2020   Acute respiratory failure with hypoxia ([place]) 08/01/2020   Severe recurrent major depression with psychotic features ([place]) 07/03/2020   Hallucinations    Shortness of breath 04/20/2020   Chronic diastolic CHF (congestive heart failure) ([place]) 04/20/2020   Nausea & vomiting 04/20/2020   Prolonged QT interval 04/20/2020   COPD with acute exacerbation ([place]) 06/20/2017   DOE (dyspnea on  exertion) 06/19/2017   Cervical dystonia 11/27/2016   C. difficile colitis 09/10/2016   Abdominal pain 09/08/2016   Acute encephalopathy    Fever    Left hemiparesis ([facility])    Generalized OA    Fibromyalgia    Tobacco abuse    Substance abuse ([facility])    Benign essential HTN    Tachycardia    Chronic pain syndrome    Acute blood loss anemia    Leukocytosis    Septic thrombophlebitis of sagittal  sinus    Acute respiratory failure ([facility])    Streptococcus pneumoniae meningitis    Streptococcal bacteremia    Meningitis    History of stroke 04/12/2016   Stroke (cerebrum) ([place]) 04/12/2016   Neck pain 04/04/2016   Chronic abdominal pain 02/19/2016   Essential hypertension 02/19/2016   Bacterial conjunctivitis 02/19/2016   Health care maintenance 02/19/2016   Anxiety 05/08/2006   Cigarette smoker 05/08/2006   ASTHMA, UNSPECIFIED 05/08/2006   GERD (gastroesophageal reflux disease) 05/08/2006   CONSTIPATION 05/08/2006   CONVULSIONS, SEIZURES, NOS 05/08/2006   PCP:  [name], MD Pharmacy:   [id number] [place] [place] [address] [address]Phone: [phone]: [phone] [facility] [place]Phone: [phone]: [phone]    Social Determinants of Health ([place] Social History: [place] No Food Insecurity (02/28/2022)  Housing: Low Risk  (02/28/2022)  Transportation Needs: No Transportation Needs (02/28/2022)  Utilities: Not At Risk (02/28/2022)  Tobacco Use: High Risk (02/28/2022)   SDOH Interventions:     Readmission Risk Interventions    03/01/2022   11:53 AM  Readmission Risk Prevention Plan  Transportation Screening Complete  Home Care Screening Complete  Medication Review (RN CM) Complete

## 2022-03-01 NOTE — Progress Notes (Signed)
  Progress Note   Patient: [name] YFV:[id number] DOB: 1950-04-10 DOA: 02/28/2022     1 DOS: the patient was seen and examined on 03/01/2022   Brief hospital course: [name] is a 71 yo female with PMH COPD, arthritis, diverticulosis, GERD, HTN, CVA who presented with worsening shortness of breath, ongoing hypoxia, and generalized malaise. She was previously seen in the ER on 02/22/2022 with cough and shortness of breath as well.  She tested positive for RSV at that time.  She was treated with breathing treatments and prescribed doxycycline and prednisone along with oxygen. Due to not feeling better over the past few days, she presented back. Testing is again positive for RSV.  She remains hypoxic requiring 2 L.  CXR did not show any obvious infiltrates or effusions. She is admitted for IV steroids and further treatment.   Assessment and Plan: * RSV (acute bronchiolitis due to respiratory syncytial virus) - still RSV positive at time of admission. -Continue steroids, Pulmicort, Brovana, flutter valve and the use of mucolytic's/antitussive medication -Continue supportive care and follow clinical response. -Wean off oxygen supplementation as tolerated.  Acute respiratory failure with hypoxia ([facility]) -Due to ongoing RSV infection and COPD exacerbation - Continue to wean oxygen supplementation as tolerated -Continue treatment for COPD exacerbation as mentioned.  Chronic pain syndrome - Continue treatment with Subutex.  Fibromyalgia - Continue treatment with Subutex -Follow clinical response. -Continue holding tramadol currently.  Essential hypertension -Blood pressure soft, but is stable -Continue to follow [place] antihypertensive agents currently.  GERD (gastroesophageal reflux disease) - Continue PPI.  Anxiety - Stable mood -Continue as needed benzodiazepines.   Subjective:  Chronically ill in appearance, underweight, demonstrating difficulty to  speak in full sentences and using oxygen supplementation to maintain saturation (3 L nasal cannula in place).  Physical Exam: Vitals:   02/28/22 2341 03/01/22 0442 03/01/22 1000 03/01/22 1237  BP: 138/70 (!) 118/51    Pulse: 68 71    Resp: 20 20    Temp: 97.6 F (36.4 C) 97.7 F (36.5 C)    TempSrc: Oral Oral    SpO2: 92% 96% 93% 94%  Weight: 44.4 kg     Height:       General exam: Alert, awake, oriented x 3; slow to respond but appropriate and adequate.  Demonstrating difficulty speaking in full sentences and using oxygen supplementation (2-3L [place]) Respiratory system: Positive rhonchi, diffuse expiratory wheezing appreciated bilaterally, tachypnea seen.  No using accessory muscles.  Nasal cannula supplementation in place. Cardiovascular system:RRR. No rubs or gallops. Gastrointestinal system: Abdomen is nondistended, soft and nontender. No organomegaly or masses felt. Normal bowel sounds heard. Central nervous system: Alert and oriented. No focal neurological deficits. Extremities: No cyst, clubbing or edema. Skin: No petechiae. Psychiatry: Judgement and insight appear normal. Mood & affect appropriate.   Data Reviewed: Procalcitonin < 1.63 Basic metabolic panel: Sodium 846, potassium 3.9, chloride 102, BUN 30 and creatinine 0.71.  Family Communication: No family at bedside.  Disposition: Status is: Inpatient Remains inpatient appropriate because: Continue treatment for acute respiratory failure with hypoxia secondary to COPD exacerbation and RSV infection.   Planned Discharge Destination: Assisted living facility.  Time spent: 35 minutes  Author: Barton Dubois, MD 03/01/2022 1:48 PM  For on call review [email or web address].

## 2022-03-02 DIAGNOSIS — J210 Acute bronchiolitis due to respiratory syncytial virus: Secondary | ICD-10-CM | POA: Diagnosis not present

## 2022-03-02 LAB — BASIC METABOLIC PANEL
Anion gap: 6 (ref 5–15)
BUN: 32 mg/dL — ABNORMAL HIGH (ref 8–23)
CO2: 33 mmol/L — ABNORMAL HIGH (ref 22–32)
Calcium: 9.2 mg/dL (ref 8.9–10.3)
Chloride: 104 mmol/L (ref 98–111)
Creatinine, Ser: 0.61 mg/dL (ref 0.44–1.00)
GFR, Estimated: >60 mL/min (ref >60)
Glucose, Bld: 147 mg/dL — ABNORMAL HIGH (ref 70–99)
Potassium: 4.1 mmol/L (ref 3.5–5.1)
Sodium: 143 mmol/L (ref 135–145)

## 2022-03-02 LAB — PROCALCITONIN: Procalcitonin: <0.1 ng/mL

## 2022-03-02 MED ORDER — OFLOXACIN 0.3 % OP SOLN
1.0000 [drp] | Freq: Four times a day (QID) | OPHTHALMIC | Status: DC
  Administered 2022-03-02 – 2022-03-08 (×27): 1 [drp] via OPHTHALMIC
  Filled 2022-03-02: qty 5

## 2022-03-02 NOTE — Progress Notes (Signed)
PROGRESS NOTE    [name]  XBJ:[id number] DOB: [birth year] DOA: 02/28/2022 PCP: Frederich Chick., MD   Brief Narrative:  [name] is a 71 yo female with PMH COPD, arthritis, diverticulosis, GERD, HTN, CVA who presented with worsening shortness of breath, ongoing hypoxia, and generalized malaise. She was previously seen in the ER on 02/22/2022 with cough and shortness of breath as well.  She tested positive for RSV at that time.  She was treated with breathing treatments and prescribed doxycycline and prednisone along with oxygen. Due to not feeling better over the past few days, she presented back. Testing is again positive for RSV.  She remains hypoxic requiring 2 L.  CXR did not show any obvious infiltrates or effusions. She is admitted for IV steroids and further treatment.     Assessment & Plan:   Principal Problem:   RSV (acute bronchiolitis due to respiratory syncytial virus) Active Problems:   Acute respiratory failure with hypoxia (HCC)   Anxiety   GERD (gastroesophageal reflux disease)   Essential hypertension   Fibromyalgia   Chronic pain syndrome  Assessment and Plan:   RSV (acute bronchiolitis due to respiratory syncytial virus) - still RSV positive at time of admission. -Continue steroids, Pulmicort, Brovana, flutter valve and the use of mucolytic's/antitussive medication -Continue supportive care and follow clinical response. -Wean off oxygen supplementation as tolerated.   Acute respiratory failure with hypoxia (HCC) -Due to ongoing RSV infection and COPD exacerbation - Continue to wean oxygen supplementation as tolerated -Continue treatment for COPD exacerbation as mentioned.  Bacterial conjunctivitis to right eye -Start ofloxacin drops and monitor carefully   Chronic pain syndrome - Continue treatment with Subutex.   Fibromyalgia - Continue treatment with Subutex -Follow clinical response. -Continue holding tramadol currently.    Essential hypertension -Blood pressure soft, but is stable -[facility] -Continue antihypertensive agents currently.   GERD (gastroesophageal reflux disease) - Continue PPI.   Anxiety - Stable mood -Continue as needed benzodiazepines.    DVT prophylaxis:Lovenox Code Status: DNR Family Communication: None at bedside Disposition Plan:  Status is: Inpatient Remains inpatient appropriate because: IV medications   Consultants:  None  Procedures:  None  Antimicrobials:  None   Subjective: Patient seen and evaluated today with no new acute complaints or concerns. No acute concerns or events noted overnight.  She continues to have ongoing cough and shortness of breath.  She is complaining of her right eye having some photophobia and tearing.  Objective: Vitals:   03/01/22 2038 03/01/22 2304 03/02/22 0554 03/02/22 0847  BP:  (!) 107/57 114/60   Pulse:  (!) 59 62   Resp:  20 18   Temp:  98.1 F (36.7 C) 98.1 F (36.7 C)   TempSrc:  Oral Oral   SpO2: 94% 96% 94% 92%  Weight:      Height:        Intake/Output Summary (Last 24 hours) at 03/02/2022 1356 Last data filed at 03/01/2022 1700 Gross per 24 hour  Intake 240 ml  Output --  Net 240 ml   Filed Weights   02/28/22 1211 02/28/22 2341  Weight: 46.3 kg 44.4 kg    Examination:  General exam: Appears calm and comfortable  Respiratory system: Clear to auscultation. Respiratory effort normal.  4 L nasal cannula Cardiovascular system: S1 & S2 heard, RRR.  Gastrointestinal system: Abdomen is soft Central nervous system: Alert and awake Extremities: No edema Skin: No significant lesions noted Psychiatry: Flat affect.  Data Reviewed: I have personally reviewed following labs and imaging studies  CBC: Recent Labs  Lab 02/28/22 1236  WBC 14.3*  NEUTROABS 11.6*  HGB 14.8  HCT 46.3*  MCV 93.3  PLT 238   Basic Metabolic Panel: Recent Labs  Lab 02/28/22 1236 03/02/22 0452  NA  144 143  K 3.9 4.1  CL 102 104  CO2 30 33*  GLUCOSE 113* 147*  BUN 30* 32*  CREATININE 0.71 0.61  CALCIUM 9.7 9.2   GFR: Estimated Creatinine Clearance: 45.9 mL/min (by C-G formula based on SCr of 0.61 mg/dL). Liver Function Tests: Recent Labs  Lab 02/28/22 1236  AST 29  ALT 27  ALKPHOS 81  BILITOT 0.7  PROT 7.1  ALBUMIN 3.9   No results for input(s): "LIPASE", "AMYLASE" in the last 168 hours. No results for input(s): "AMMONIA" in the last 168 hours. Coagulation Profile: No results for input(s): "INR", "PROTIME" in the last 168 hours. Cardiac Enzymes: No results for input(s): "CKTOTAL", "CKMB", "CKMBINDEX", "TROPONINI" in the last 168 hours. BNP (last 3 results) No results for input(s): "PROBNP" in the last 8760 hours. HbA1C: No results for input(s): "HGBA1C" in the last 72 hours. CBG: No results for input(s): "GLUCAP" in the last 168 hours. Lipid Profile: No results for input(s): "CHOL", "HDL", "LDLCALC", "TRIG", "CHOLHDL", "LDLDIRECT" in the last 72 hours. Thyroid Function Tests: No results for input(s): "TSH", "T4TOTAL", "FREET4", "T3FREE", "THYROIDAB" in the last 72 hours. Anemia Panel: No results for input(s): "VITAMINB12", "FOLATE", "FERRITIN", "TIBC", "IRON", "RETICCTPCT" in the last 72 hours. Sepsis Labs: Recent Labs  Lab 02/28/22 1236 03/01/22 0435 03/02/22 0452  PROCALCITON <0.10 <0.10 <0.10    Recent Results (from the past 240 hour(s))  Resp panel by RT-PCR (RSV, Flu A&B, Covid) Anterior Nasal Swab     Status: Abnormal   Collection Time: 02/22/22  6:35 PM   Specimen: Anterior Nasal Swab  Result Value Ref Range Status   SARS Coronavirus 2 by RT PCR NEGATIVE NEGATIVE Final    Comment: (NOTE) SARS-CoV-2 target nucleic acids are NOT DETECTED.  The SARS-CoV-2 RNA is generally detectable in upper respiratory specimens during the acute phase of infection. The lowest concentration of SARS-CoV-2 viral copies this assay can detect is 138 copies/mL. A  negative result does not preclude SARS-Cov-2 infection and should not be used as the sole basis for treatment or other patient management decisions. A negative result may occur with  improper specimen collection/handling, submission of specimen other than nasopharyngeal swab, presence of viral mutation(s) within the areas targeted by this assay, and inadequate number of viral copies(<138 copies/mL). A negative result must be combined with clinical observations, patient history, and epidemiological information. The expected result is Negative.  Fact Sheet for Patients:  BloggerCourse.com  Fact Sheet for Healthcare Providers:  SeriousBroker.it  This test is no t yet approved or cleared by the [place] FDA and  has been authorized for detection and/or diagnosis of SARS-CoV-2 by FDA under an Emergency Use Authorization (EUA). This EUA will remain  in effect (meaning this test can be used) for the duration of the COVID-19 declaration under Section 564(b)(1) of the Act, 21 U.S.C.section 360bbb-3(b)(1), unless the authorization is terminated  or revoked sooner.       Influenza A by PCR NEGATIVE NEGATIVE Final   Influenza B by PCR NEGATIVE NEGATIVE Final    Comment: (NOTE) The Xpert Xpress SARS-CoV-2/FLU/RSV plus assay is intended as an aid in the diagnosis of influenza from Nasopharyngeal swab specimens and should not  be used as a sole basis for treatment. Nasal washings and aspirates are unacceptable for Xpert Xpress SARS-CoV-2/FLU/RSV testing.  Fact Sheet for Patients: BloggerCourse.com  Fact Sheet for Healthcare Providers: SeriousBroker.it  This test is not yet approved or cleared by the [place] FDA and has been authorized for detection and/or diagnosis of SARS-CoV-2 by FDA under an Emergency Use Authorization (EUA). This EUA will remain in effect (meaning this test can  be used) for the duration of the COVID-19 declaration under Section 564(b)(1) of the Act, 21 U.S.C. section 360bbb-3(b)(1), unless the authorization is terminated or revoked.     Resp Syncytial Virus by PCR POSITIVE (A) NEGATIVE Final    Comment: (NOTE) Fact Sheet for Patients: BloggerCourse.com  Fact Sheet for Healthcare Providers: SeriousBroker.it  This test is not yet approved or cleared by the [place] FDA and has been authorized for detection and/or diagnosis of SARS-CoV-2 by FDA under an Emergency Use Authorization (EUA). This EUA will remain in effect (meaning this test can be used) for the duration of the COVID-19 declaration under Section 564(b)(1) of the Act, 21 U.S.C. section 360bbb-3(b)(1), unless the authorization is terminated or revoked.  Performed at [facility], [address] [place], [place] [address]   Resp panel by RT-PCR (RSV, Flu A&B, Covid) Anterior Nasal Swab     Status: Abnormal   Collection Time: 02/28/22 12:35 PM   Specimen: Anterior Nasal Swab  Result Value Ref Range Status   SARS Coronavirus 2 by RT PCR NEGATIVE NEGATIVE Final    Comment: (NOTE) SARS-CoV-2 target nucleic acids are NOT DETECTED.  The SARS-CoV-2 RNA is generally detectable in upper respiratory specimens during the acute phase of infection. The lowest concentration of SARS-CoV-2 viral copies this assay can detect is 138 copies/mL. A negative result does not preclude SARS-Cov-2 infection and should not be used as the sole basis for treatment or other patient management decisions. A negative result may occur with  improper specimen collection/handling, submission of specimen other than nasopharyngeal swab, presence of viral mutation(s) within the areas targeted by this assay, and inadequate number of viral copies(<138 copies/mL). A negative result must be combined with clinical observations, patient history, and  epidemiological information. The expected result is Negative.  Fact Sheet for Patients:  BloggerCourse.com  Fact Sheet for Healthcare Providers:  SeriousBroker.it  This test is no t yet approved or cleared by the [place] FDA and  has been authorized for detection and/or diagnosis of SARS-CoV-2 by FDA under an Emergency Use Authorization (EUA). This EUA will remain  in effect (meaning this test can be used) for the duration of the COVID-19 declaration under Section 564(b)(1) of the Act, 21 U.S.C.section 360bbb-3(b)(1), unless the authorization is terminated  or revoked sooner.       Influenza A by PCR NEGATIVE NEGATIVE Final   Influenza B by PCR NEGATIVE NEGATIVE Final    Comment: (NOTE) The Xpert Xpress SARS-CoV-2/FLU/RSV plus assay is intended as an aid in the diagnosis of influenza from Nasopharyngeal swab specimens and should not be used as a sole basis for treatment. Nasal washings and aspirates are unacceptable for Xpert Xpress SARS-CoV-2/FLU/RSV testing.  Fact Sheet for Patients: BloggerCourse.com  Fact Sheet for Healthcare Providers: SeriousBroker.it  This test is not yet approved or cleared by the [place] FDA and has been authorized for detection and/or diagnosis of SARS-CoV-2 by FDA under an Emergency Use Authorization (EUA). This EUA will remain in effect (meaning this test can be used) for the duration of the  COVID-19 declaration under Section 564(b)(1) of the Act, 21 U.S.C. section 360bbb-3(b)(1), unless the authorization is terminated or revoked.     Resp Syncytial Virus by PCR POSITIVE (A) NEGATIVE Final    Comment: (NOTE) Fact Sheet for Patients: BloggerCourse.com  Fact Sheet for Healthcare Providers: SeriousBroker.it  This test is not yet approved or cleared by the [place] FDA and has  been authorized for detection and/or diagnosis of SARS-CoV-2 by FDA under an Emergency Use Authorization (EUA). This EUA will remain in effect (meaning this test can be used) for the duration of the COVID-19 declaration under Section 564(b)(1) of the Act, 21 U.S.C. section 360bbb-3(b)(1), unless the authorization is terminated or revoked.  Performed at [facility], [address] [place], [place] [address]   Blood culture (routine x 2)     Status: None (Preliminary result)   Collection Time: 02/28/22  2:04 PM   Specimen: BLOOD  Result Value Ref Range Status   Specimen Description BLOOD BLOOD RIGHT ARM  Final   Special Requests   Final    BOTTLES DRAWN AEROBIC AND ANAEROBIC Blood Culture adequate volume BLOOD RIGHT ARM   Culture   Final    NO GROWTH 2 DAYS Performed at [facility], [address], [place], [place] [address]    Report Status PENDING  Incomplete  Blood culture (routine x 2)     Status: None (Preliminary result)   Collection Time: 02/28/22  2:04 PM   Specimen: BLOOD  Result Value Ref Range Status   Specimen Description BLOOD BLOOD LEFT ARM  Final   Special Requests   Final    BOTTLES DRAWN AEROBIC AND ANAEROBIC Blood Culture adequate volume BLOOD LEFT ARM   Culture   Final    NO GROWTH 2 DAYS Performed at [facility], [address], [place], [place] [address]    Report Status PENDING  Incomplete         Radiology Studies: No results found.      Scheduled Meds:  arformoterol  15 mcg Nebulization BID   ARIPiprazole  5 mg Oral Daily   budesonide (PULMICORT) nebulizer solution  0.5 mg Nebulization BID   dextromethorphan-guaiFENesin  1 tablet Oral BID   donepezil  10 mg Oral Daily   doxepin  50 mg Oral Daily   enoxaparin (LOVENOX) injection  30 mg Subcutaneous Q24H   ipratropium-albuterol  3 mL Nebulization QID   methylPREDNISolone (SOLU-MEDROL) injection  40 mg Intravenous BID   mirtazapine  7.5 mg Oral QHS   ofloxacin  1 drop Right Eye QID    OLANZapine  10 mg Oral QHS   pantoprazole  40 mg Oral Daily   sodium chloride flush  3 mL Intravenous Q12H   temazepam  22.5 mg Oral QHS     LOS: 2 days    Time spent: 35 minutes    Napolean Sia Hoover Brunette, DO [facility]  If 7PM-7AM, please contact night-coverage [email or web address] 03/02/2022, 1:56 PM

## 2022-03-02 NOTE — Progress Notes (Signed)
Noted that patients right eye was swollen, red, and had clear drainage. Patient stated that things looked blurry. Notified MD. New orders for ofloxacin QID.

## 2022-03-03 DIAGNOSIS — J210 Acute bronchiolitis due to respiratory syncytial virus: Secondary | ICD-10-CM | POA: Diagnosis not present

## 2022-03-03 LAB — CBC
HCT: 41 % (ref 36.0–46.0)
Hemoglobin: 13.3 g/dL (ref 12.0–15.0)
MCH: 29.8 pg (ref 26.0–34.0)
MCHC: 32.4 g/dL (ref 30.0–36.0)
MCV: 91.7 fL (ref 80.0–100.0)
Platelets: 275 10*3/uL (ref 150–400)
RBC: 4.47 MIL/uL (ref 3.87–5.11)
RDW: 14.6 % (ref 11.5–15.5)
WBC: 13.7 10*3/uL — ABNORMAL HIGH (ref 4.0–10.5)
nRBC: 0 % (ref 0.0–0.2)

## 2022-03-03 LAB — BASIC METABOLIC PANEL
Anion gap: 7 (ref 5–15)
BUN: 31 mg/dL — ABNORMAL HIGH (ref 8–23)
CO2: 32 mmol/L (ref 22–32)
Calcium: 9.1 mg/dL (ref 8.9–10.3)
Chloride: 104 mmol/L (ref 98–111)
Creatinine, Ser: 0.51 mg/dL (ref 0.44–1.00)
GFR, Estimated: >60 mL/min (ref >60)
Glucose, Bld: 144 mg/dL — ABNORMAL HIGH (ref 70–99)
Potassium: 4 mmol/L (ref 3.5–5.1)
Sodium: 143 mmol/L (ref 135–145)

## 2022-03-03 LAB — MAGNESIUM: Magnesium: 1.9 mg/dL (ref 1.7–2.4)

## 2022-03-03 MED ORDER — REVEFENACIN 175 MCG/3ML IN SOLN
175.0000 ug | Freq: Every day | RESPIRATORY_TRACT | Status: DC
  Administered 2022-03-03 – 2022-03-07 (×5): 175 ug via RESPIRATORY_TRACT
  Filled 2022-03-03 (×4): qty 3

## 2022-03-03 NOTE — Progress Notes (Signed)
Pt has remained 92% and above this shift on 2L [place]. Pt has sat in recliner most of the shift with minimal assist to the restroom. Pt in a pleasant mood. Congested, but weak cough continues with encouragement to use her incentive spirometer.

## 2022-03-03 NOTE — Progress Notes (Signed)
PROGRESS NOTE    [name]  XLK:[id number] DOB: January 04, 1951 DOA: 02/28/2022 PCP: Frederich Chick., MD   Brief Narrative:  [name] is a 71 yo female with PMH COPD, arthritis, diverticulosis, GERD, HTN, CVA who presented with worsening shortness of breath, ongoing hypoxia, and generalized malaise. She was previously seen in the ER on 02/22/2022 with cough and shortness of breath as well.  She tested positive for RSV at that time.  She was treated with breathing treatments and prescribed doxycycline and prednisone along with oxygen. Due to not feeling better over the past few days, she presented back. Testing is again positive for RSV.  She remains hypoxic requiring 2 L.  CXR did not show any obvious infiltrates or effusions. She is admitted for IV steroids and further treatment.     Assessment & Plan:   Principal Problem:   RSV (acute bronchiolitis due to respiratory syncytial virus) Active Problems:   Acute respiratory failure with hypoxia (HCC)   Anxiety   GERD (gastroesophageal reflux disease)   Essential hypertension   Fibromyalgia   Chronic pain syndrome  Assessment and Plan:   RSV (acute bronchiolitis due to respiratory syncytial virus) - still RSV positive at time of admission. -Continue steroids, Pulmicort, Brovana, flutter valve and the use of mucolytic's/antitussive medication; start Yupelri 12/24 -Continue supportive care and follow clinical response. -Wean off oxygen supplementation as tolerated.   Acute respiratory failure with hypoxia (HCC) -Due to ongoing RSV infection and COPD exacerbation - Continue to wean oxygen supplementation as tolerated -Continue treatment for COPD exacerbation as mentioned.   Bacterial conjunctivitis to right eye-improving -Continue ofloxacin drops and monitor carefully   Chronic pain syndrome - Continue treatment with Subutex.   Fibromyalgia - Continue treatment with Subutex -Follow clinical response. -Continue  holding tramadol currently.   Essential hypertension -Blood pressure soft, but is stable -[facility] -Continue antihypertensive agents currently.   GERD (gastroesophageal reflux disease) - Continue PPI.   Anxiety - Stable mood -Continue as needed benzodiazepines.     DVT prophylaxis:Lovenox Code Status: DNR Family Communication: None at bedside Disposition Plan:  Status is: Inpatient Remains inpatient appropriate because: IV medications     Consultants:  None   Procedures:  None   Antimicrobials:  None    Subjective: Patient seen and evaluated today with improvement to her symptoms in her right eye.  No acute overnight events noted and she is continuing to have ongoing chest congestion and shortness of breath.  Encouraged ambulation and getting up in chair.  Objective: Vitals:   03/02/22 2100 03/02/22 2246 03/03/22 0554 03/03/22 0948  BP: 120/60 115/63 119/65   Pulse: 67 61 68   Resp: 18 18 20    Temp: 98.7 F (37.1 C) 97.8 F (36.6 C) 98.1 F (36.7 C)   TempSrc: Oral Oral    SpO2: 96% 95% 95% 92%  Weight:      Height:        Intake/Output Summary (Last 24 hours) at 03/03/2022 1138 Last data filed at 03/02/2022 1900 Gross per 24 hour  Intake 480 ml  Output --  Net 480 ml   Filed Weights   02/28/22 1211 02/28/22 2341  Weight: 46.3 kg 44.4 kg    Examination:  General exam: Appears calm and comfortable  Respiratory system: Chest congestion and minimal wheezing noted bilaterally.  Continues on 4 L nasal cannula. Cardiovascular system: S1 & S2 heard, RRR.  Gastrointestinal system: Abdomen is soft Central nervous system: Alert and  awake Extremities: No edema Skin: No significant lesions noted Psychiatry: Flat affect.    Data Reviewed: I have personally reviewed following labs and imaging studies  CBC: Recent Labs  Lab 02/28/22 1236 03/03/22 0545  WBC 14.3* 13.7*  NEUTROABS 11.6*  --   HGB 14.8 13.3  HCT 46.3* 41.0   MCV 93.3 91.7  PLT 238 275   Basic Metabolic Panel: Recent Labs  Lab 02/28/22 1236 03/02/22 0452 03/03/22 0545  NA 144 143 143  K 3.9 4.1 4.0  CL 102 104 104  CO2 30 33* 32  GLUCOSE 113* 147* 144*  BUN 30* 32* 31*  CREATININE 0.71 0.61 0.51  CALCIUM 9.7 9.2 9.1  MG  --   --  1.9   GFR: Estimated Creatinine Clearance: 45.9 mL/min (by C-G formula based on SCr of 0.51 mg/dL). Liver Function Tests: Recent Labs  Lab 02/28/22 1236  AST 29  ALT 27  ALKPHOS 81  BILITOT 0.7  PROT 7.1  ALBUMIN 3.9   No results for input(s): "LIPASE", "AMYLASE" in the last 168 hours. No results for input(s): "AMMONIA" in the last 168 hours. Coagulation Profile: No results for input(s): "INR", "PROTIME" in the last 168 hours. Cardiac Enzymes: No results for input(s): "CKTOTAL", "CKMB", "CKMBINDEX", "TROPONINI" in the last 168 hours. BNP (last 3 results) No results for input(s): "PROBNP" in the last 8760 hours. HbA1C: No results for input(s): "HGBA1C" in the last 72 hours. CBG: No results for input(s): "GLUCAP" in the last 168 hours. Lipid Profile: No results for input(s): "CHOL", "HDL", "LDLCALC", "TRIG", "CHOLHDL", "LDLDIRECT" in the last 72 hours. Thyroid Function Tests: No results for input(s): "TSH", "T4TOTAL", "FREET4", "T3FREE", "THYROIDAB" in the last 72 hours. Anemia Panel: No results for input(s): "VITAMINB12", "FOLATE", "FERRITIN", "TIBC", "IRON", "RETICCTPCT" in the last 72 hours. Sepsis Labs: Recent Labs  Lab 02/28/22 1236 03/01/22 0435 03/02/22 0452  PROCALCITON <0.10 <0.10 <0.10    Recent Results (from the past 240 hour(s))  Resp panel by RT-PCR (RSV, Flu A&B, Covid) Anterior Nasal Swab     Status: Abnormal   Collection Time: 02/22/22  6:35 PM   Specimen: Anterior Nasal Swab  Result Value Ref Range Status   SARS Coronavirus 2 by RT PCR NEGATIVE NEGATIVE Final    Comment: (NOTE) SARS-CoV-2 target nucleic acids are NOT DETECTED.  The SARS-CoV-2 RNA is generally  detectable in upper respiratory specimens during the acute phase of infection. The lowest concentration of SARS-CoV-2 viral copies this assay can detect is 138 copies/mL. A negative result does not preclude SARS-Cov-2 infection and should not be used as the sole basis for treatment or other patient management decisions. A negative result may occur with  improper specimen collection/handling, submission of specimen other than nasopharyngeal swab, presence of viral mutation(s) within the areas targeted by this assay, and inadequate number of viral copies(<138 copies/mL). A negative result must be combined with clinical observations, patient history, and epidemiological information. The expected result is Negative.  Fact Sheet for Patients:  BloggerCourse.com  Fact Sheet for Healthcare Providers:  SeriousBroker.it  This test is no t yet approved or cleared by the [place] FDA and  has been authorized for detection and/or diagnosis of SARS-CoV-2 by FDA under an Emergency Use Authorization (EUA). This EUA will remain  in effect (meaning this test can be used) for the duration of the COVID-19 declaration under Section 564(b)(1) of the Act, 21 U.S.C.section 360bbb-3(b)(1), unless the authorization is terminated  or revoked sooner.  Influenza A by PCR NEGATIVE NEGATIVE Final   Influenza B by PCR NEGATIVE NEGATIVE Final    Comment: (NOTE) The Xpert Xpress SARS-CoV-2/FLU/RSV plus assay is intended as an aid in the diagnosis of influenza from Nasopharyngeal swab specimens and should not be used as a sole basis for treatment. Nasal washings and aspirates are unacceptable for Xpert Xpress SARS-CoV-2/FLU/RSV testing.  Fact Sheet for Patients: BloggerCourse.com  Fact Sheet for Healthcare Providers: SeriousBroker.it  This test is not yet approved or cleared by the [place] FDA  and has been authorized for detection and/or diagnosis of SARS-CoV-2 by FDA under an Emergency Use Authorization (EUA). This EUA will remain in effect (meaning this test can be used) for the duration of the COVID-19 declaration under Section 564(b)(1) of the Act, 21 U.S.C. section 360bbb-3(b)(1), unless the authorization is terminated or revoked.     Resp Syncytial Virus by PCR POSITIVE (A) NEGATIVE Final    Comment: (NOTE) Fact Sheet for Patients: BloggerCourse.com  Fact Sheet for Healthcare Providers: SeriousBroker.it  This test is not yet approved or cleared by the [place] FDA and has been authorized for detection and/or diagnosis of SARS-CoV-2 by FDA under an Emergency Use Authorization (EUA). This EUA will remain in effect (meaning this test can be used) for the duration of the COVID-19 declaration under Section 564(b)(1) of the Act, 21 U.S.C. section 360bbb-3(b)(1), unless the authorization is terminated or revoked.  Performed at [facility], [address], [place], [place] [address]   Resp panel by RT-PCR (RSV, Flu A&B, Covid) Anterior Nasal Swab     Status: Abnormal   Collection Time: 02/28/22 12:35 PM   Specimen: Anterior Nasal Swab  Result Value Ref Range Status   SARS Coronavirus 2 by RT PCR NEGATIVE NEGATIVE Final    Comment: (NOTE) SARS-CoV-2 target nucleic acids are NOT DETECTED.  The SARS-CoV-2 RNA is generally detectable in upper respiratory specimens during the acute phase of infection. The lowest concentration of SARS-CoV-2 viral copies this assay can detect is 138 copies/mL. A negative result does not preclude SARS-Cov-2 infection and should not be used as the sole basis for treatment or other patient management decisions. A negative result may occur with  improper specimen collection/handling, submission of specimen other than nasopharyngeal swab, presence of viral mutation(s) within the areas  targeted by this assay, and inadequate number of viral copies(<138 copies/mL). A negative result must be combined with clinical observations, patient history, and epidemiological information. The expected result is Negative.  Fact Sheet for Patients:  BloggerCourse.com  Fact Sheet for Healthcare Providers:  SeriousBroker.it  This test is no t yet approved or cleared by the [place] FDA and  has been authorized for detection and/or diagnosis of SARS-CoV-2 by FDA under an Emergency Use Authorization (EUA). This EUA will remain  in effect (meaning this test can be used) for the duration of the COVID-19 declaration under Section 564(b)(1) of the Act, 21 U.S.C.section 360bbb-3(b)(1), unless the authorization is terminated  or revoked sooner.       Influenza A by PCR NEGATIVE NEGATIVE Final   Influenza B by PCR NEGATIVE NEGATIVE Final    Comment: (NOTE) The Xpert Xpress SARS-CoV-2/FLU/RSV plus assay is intended as an aid in the diagnosis of influenza from Nasopharyngeal swab specimens and should not be used as a sole basis for treatment. Nasal washings and aspirates are unacceptable for Xpert Xpress SARS-CoV-2/FLU/RSV testing.  Fact Sheet for Patients: BloggerCourse.com  Fact Sheet for Healthcare Providers: SeriousBroker.it  This test is not  yet approved or cleared by the [place] and has been authorized for detection and/or diagnosis of SARS-CoV-2 by FDA under an Emergency Use Authorization (EUA). This EUA will remain in effect (meaning this test can be used) for the duration of the COVID-19 declaration under Section 564(b)(1) of the Act, 21 U.S.C. section 360bbb-3(b)(1), unless the authorization is terminated or revoked.     Resp Syncytial Virus by PCR POSITIVE (A) NEGATIVE Final    Comment: (NOTE) Fact Sheet for  Patients: BloggerCourse.com  Fact Sheet for Healthcare Providers: SeriousBroker.it  This test is not yet approved or cleared by the [place] FDA and has been authorized for detection and/or diagnosis of SARS-CoV-2 by FDA under an Emergency Use Authorization (EUA). This EUA will remain in effect (meaning this test can be used) for the duration of the COVID-19 declaration under Section 564(b)(1) of the Act, 21 U.S.C. section 360bbb-3(b)(1), unless the authorization is terminated or revoked.  Performed at [facility], [address] [place], [place] [address]   Blood culture (routine x 2)     Status: None (Preliminary result)   Collection Time: 02/28/22  2:04 PM   Specimen: BLOOD  Result Value Ref Range Status   Specimen Description BLOOD BLOOD RIGHT ARM  Final   Special Requests   Final    BOTTLES DRAWN AEROBIC AND ANAEROBIC Blood Culture adequate volume BLOOD RIGHT ARM   Culture   Final    NO GROWTH 3 DAYS Performed at [facility], [address] [place], [place] [address]    Report Status PENDING  Incomplete  Blood culture (routine x 2)     Status: None (Preliminary result)   Collection Time: 02/28/22  2:04 PM   Specimen: BLOOD  Result Value Ref Range Status   Specimen Description BLOOD BLOOD LEFT ARM  Final   Special Requests   Final    BOTTLES DRAWN AEROBIC AND ANAEROBIC Blood Culture adequate volume BLOOD LEFT ARM   Culture   Final    NO GROWTH 3 DAYS Performed at [facility], [address], [place], [place] [address]    Report Status PENDING  Incomplete         Radiology Studies: No results found.      Scheduled Meds:  arformoterol  15 mcg Nebulization BID   ARIPiprazole  5 mg Oral Daily   budesonide (PULMICORT) nebulizer solution  0.5 mg Nebulization BID   dextromethorphan-guaiFENesin  1 tablet Oral BID   donepezil  10 mg Oral Daily   doxepin  50 mg Oral Daily   enoxaparin (LOVENOX) injection  30  mg Subcutaneous Q24H   ipratropium-albuterol  3 mL Nebulization QID   methylPREDNISolone (SOLU-MEDROL) injection  40 mg Intravenous BID   mirtazapine  7.5 mg Oral QHS   ofloxacin  1 drop Right Eye QID   OLANZapine  10 mg Oral QHS   pantoprazole  40 mg Oral Daily   revefenacin  175 mcg Nebulization Daily   sodium chloride flush  3 mL Intravenous Q12H   temazepam  22.5 mg Oral QHS     LOS: 3 days    Time spent: 35 minutes    Criselda Starke Hoover Brunette, DO [facility]  If 7PM-7AM, please contact night-coverage [email or web address] 03/03/2022, 11:38 AM

## 2022-03-03 NOTE — Progress Notes (Signed)
Patient has rested well. No new complaints or acute events. Patient was not able to be weaned down from her four liters of oxygen at this time.

## 2022-03-04 DIAGNOSIS — J210 Acute bronchiolitis due to respiratory syncytial virus: Secondary | ICD-10-CM | POA: Diagnosis not present

## 2022-03-04 LAB — CBC
HCT: 42.8 % (ref 36.0–46.0)
Hemoglobin: 14 g/dL (ref 12.0–15.0)
MCH: 30.2 pg (ref 26.0–34.0)
MCHC: 32.7 g/dL (ref 30.0–36.0)
MCV: 92.4 fL (ref 80.0–100.0)
Platelets: 296 10*3/uL (ref 150–400)
RBC: 4.63 MIL/uL (ref 3.87–5.11)
RDW: 14.6 % (ref 11.5–15.5)
WBC: 15.5 10*3/uL — ABNORMAL HIGH (ref 4.0–10.5)
nRBC: 0 % (ref 0.0–0.2)

## 2022-03-04 MED ORDER — LOSARTAN POTASSIUM 50 MG PO TABS
100.0000 mg | ORAL_TABLET | Freq: Every day | ORAL | Status: DC
  Administered 2022-03-04 – 2022-03-08 (×5): 100 mg via ORAL
  Filled 2022-03-04 (×5): qty 2

## 2022-03-04 MED ORDER — CARVEDILOL 3.125 MG PO TABS
6.2500 mg | ORAL_TABLET | Freq: Two times a day (BID) | ORAL | Status: DC
  Administered 2022-03-04 – 2022-03-08 (×10): 6.25 mg via ORAL
  Filled 2022-03-04 (×10): qty 2

## 2022-03-04 MED ORDER — ACETYLCYSTEINE 20 % IN SOLN
4.0000 mL | Freq: Three times a day (TID) | RESPIRATORY_TRACT | Status: DC
  Administered 2022-03-04 – 2022-03-08 (×14): 4 mL via RESPIRATORY_TRACT
  Filled 2022-03-04 (×14): qty 4

## 2022-03-04 NOTE — Plan of Care (Signed)

## 2022-03-04 NOTE — Progress Notes (Signed)
PROGRESS NOTE    [name]  WUJ:[id number] DOB: [birth year] DOA: 02/28/2022 PCP: Marissa Chick., MD   Brief Narrative:  [name] is a 71 yo female with PMH COPD, arthritis, diverticulosis, GERD, HTN, CVA who presented with worsening shortness of breath, ongoing hypoxia, and generalized malaise. She was previously seen in the ER on 02/22/2022 with cough and shortness of breath as well.  She tested positive for RSV at that time.  She was treated with breathing treatments and prescribed doxycycline and prednisone along with oxygen. Due to not feeling better over the past few days, she presented back. Testing is again positive for RSV.  She remains hypoxic requiring 2 L.  CXR did not show any obvious infiltrates or effusions. She is admitted for IV steroids and further treatment.     Assessment & Plan:   Principal Problem:   RSV (acute bronchiolitis due to respiratory syncytial virus) Active Problems:   Acute respiratory failure with hypoxia (HCC)   Anxiety   GERD (gastroesophageal reflux disease)   Essential hypertension   Fibromyalgia   Chronic pain syndrome  Assessment and Plan:  RSV (acute bronchiolitis due to respiratory syncytial virus) - still RSV positive at time of admission. -Continue steroids, Pulmicort, Brovana, flutter valve and the use of mucolytic's/antitussive medication; started Yupelri 12/24 -Added Mucomyst 12/25 due to persistent congestion, start flutter valve -Continue supportive care and follow clinical response. -Wean off oxygen supplementation as tolerated.   Acute respiratory failure with hypoxia (HCC) -Due to ongoing RSV infection and COPD exacerbation - Continue to wean oxygen supplementation as tolerated -Continue treatment for COPD exacerbation as mentioned.   Bacterial conjunctivitis to right eye-improving -Continue ofloxacin drops and monitor carefully   Chronic pain syndrome - Continue treatment with Subutex.    Fibromyalgia - Continue treatment with Subutex -Follow clinical response. -Continue holding tramadol currently.   Essential hypertension -Blood pressure soft, but is stable -[facility] -Continue antihypertensive agents currently.   GERD (gastroesophageal reflux disease) - Continue PPI.   Anxiety - Stable mood -Continue as needed benzodiazepines.     DVT prophylaxis:Lovenox Code Status: DNR Family Communication: None at bedside Disposition Plan:  Status is: Inpatient Remains inpatient appropriate because: IV medications     Consultants:  None   Procedures:  None   Antimicrobials:  None   Subjective: Patient seen and evaluated today with ongoing chest congestion with minimal wheezing noted.  It has been difficult for her to wean off of oxygen over the last 24 hours.  She continues to have some symptoms to her right eye with tearing and photophobia.  Objective: Vitals:   03/03/22 2238 03/04/22 0434 03/04/22 0711 03/04/22 0826  BP: (!) 143/62 (!) 160/102  138/74  Pulse: 73 92  66  Resp: 19 20  16   Temp: 98 F (36.7 C) 98 F (36.7 C)  98.1 F (36.7 C)  TempSrc: Oral Oral  Oral  SpO2: 97% 94% 96% 92%  Weight:      Height:        Intake/Output Summary (Last 24 hours) at 03/04/2022 1034 Last data filed at 03/03/2022 2221 Gross per 24 hour  Intake 403 ml  Output --  Net 403 ml   Filed Weights   02/28/22 1211 02/28/22 2341  Weight: 46.3 kg 44.4 kg    Examination:  General exam: Appears calm and comfortable  Respiratory system: bilateral congestion with wheezing. Respiratory effort normal.  3 L nasal cannula Cardiovascular system: S1 & S2 heard,  RRR.  Gastrointestinal system: Abdomen is soft Central nervous system: Alert and awake Extremities: No edema Skin: No significant lesions noted Psychiatry: Flat affect.    Data Reviewed: I have personally reviewed following labs and imaging studies  CBC: Recent Labs  Lab  02/28/22 1236 03/03/22 0545 03/04/22 0514  WBC 14.3* 13.7* 15.5*  NEUTROABS 11.6*  --   --   HGB 14.8 13.3 14.0  HCT 46.3* 41.0 42.8  MCV 93.3 91.7 92.4  PLT 238 275 296   Basic Metabolic Panel: Recent Labs  Lab 02/28/22 1236 03/02/22 0452 03/03/22 0545  NA 144 143 143  K 3.9 4.1 4.0  CL 102 104 104  CO2 30 33* 32  GLUCOSE 113* 147* 144*  BUN 30* 32* 31*  CREATININE 0.71 0.61 0.51  CALCIUM 9.7 9.2 9.1  MG  --   --  1.9   GFR: Estimated Creatinine Clearance: 45.9 mL/min (by C-G formula based on SCr of 0.51 mg/dL). Liver Function Tests: Recent Labs  Lab 02/28/22 1236  AST 29  ALT 27  ALKPHOS 81  BILITOT 0.7  PROT 7.1  ALBUMIN 3.9   No results for input(s): "LIPASE", "AMYLASE" in the last 168 hours. No results for input(s): "AMMONIA" in the last 168 hours. Coagulation Profile: No results for input(s): "INR", "PROTIME" in the last 168 hours. Cardiac Enzymes: No results for input(s): "CKTOTAL", "CKMB", "CKMBINDEX", "TROPONINI" in the last 168 hours. BNP (last 3 results) No results for input(s): "PROBNP" in the last 8760 hours. HbA1C: No results for input(s): "HGBA1C" in the last 72 hours. CBG: No results for input(s): "GLUCAP" in the last 168 hours. Lipid Profile: No results for input(s): "CHOL", "HDL", "LDLCALC", "TRIG", "CHOLHDL", "LDLDIRECT" in the last 72 hours. Thyroid Function Tests: No results for input(s): "TSH", "T4TOTAL", "FREET4", "T3FREE", "THYROIDAB" in the last 72 hours. Anemia Panel: No results for input(s): "VITAMINB12", "FOLATE", "FERRITIN", "TIBC", "IRON", "RETICCTPCT" in the last 72 hours. Sepsis Labs: Recent Labs  Lab 02/28/22 1236 03/01/22 0435 03/02/22 0452  PROCALCITON <0.10 <0.10 <0.10    Recent Results (from the past 240 hour(s))  Resp panel by RT-PCR (RSV, Flu A&B, Covid) Anterior Nasal Swab     Status: Abnormal   Collection Time: 02/22/22  6:35 PM   Specimen: Anterior Nasal Swab  Result Value Ref Range Status   SARS  Coronavirus 2 by RT PCR NEGATIVE NEGATIVE Final    Comment: (NOTE) SARS-CoV-2 target nucleic acids are NOT DETECTED.  The SARS-CoV-2 RNA is generally detectable in upper respiratory specimens during the acute phase of infection. The lowest concentration of SARS-CoV-2 viral copies this assay can detect is 138 copies/mL. A negative result does not preclude SARS-Cov-2 infection and should not be used as the sole basis for treatment or other patient management decisions. A negative result may occur with  improper specimen collection/handling, submission of specimen other than nasopharyngeal swab, presence of viral mutation(s) within the areas targeted by this assay, and inadequate number of viral copies(<138 copies/mL). A negative result must be combined with clinical observations, patient history, and epidemiological information. The expected result is Negative.  Fact Sheet for Patients:  BloggerCourse.com  Fact Sheet for Healthcare Providers:  SeriousBroker.it  This test is no t yet approved or cleared by the [place] FDA and  has been authorized for detection and/or diagnosis of SARS-CoV-2 by FDA under an Emergency Use Authorization (EUA). This EUA will remain  in effect (meaning this test can be used) for the duration of the COVID-19 declaration under Section  564(b)(1) of the Act, 21 U.S.C.section 360bbb-3(b)(1), unless the authorization is terminated  or revoked sooner.       Influenza A by PCR NEGATIVE NEGATIVE Final   Influenza B by PCR NEGATIVE NEGATIVE Final    Comment: (NOTE) The Xpert Xpress SARS-CoV-2/FLU/RSV plus assay is intended as an aid in the diagnosis of influenza from Nasopharyngeal swab specimens and should not be used as a sole basis for treatment. Nasal washings and aspirates are unacceptable for Xpert Xpress SARS-CoV-2/FLU/RSV testing.  Fact Sheet for  Patients: BloggerCourse.com  Fact Sheet for Healthcare Providers: SeriousBroker.it  This test is not yet approved or cleared by the [place] FDA and has been authorized for detection and/or diagnosis of SARS-CoV-2 by FDA under an Emergency Use Authorization (EUA). This EUA will remain in effect (meaning this test can be used) for the duration of the COVID-19 declaration under Section 564(b)(1) of the Act, 21 U.S.C. section 360bbb-3(b)(1), unless the authorization is terminated or revoked.     Resp Syncytial Virus by PCR POSITIVE (A) NEGATIVE Final    Comment: (NOTE) Fact Sheet for Patients: BloggerCourse.com  Fact Sheet for Healthcare Providers: SeriousBroker.it  This test is not yet approved or cleared by the [place] FDA and has been authorized for detection and/or diagnosis of SARS-CoV-2 by FDA under an Emergency Use Authorization (EUA). This EUA will remain in effect (meaning this test can be used) for the duration of the COVID-19 declaration under Section 564(b)(1) of the Act, 21 U.S.C. section 360bbb-3(b)(1), unless the authorization is terminated or revoked.  Performed at [facility], [address], [place], [place] [address]   Resp panel by RT-PCR (RSV, Flu A&B, Covid) Anterior Nasal Swab     Status: Abnormal   Collection Time: 02/28/22 12:35 PM   Specimen: Anterior Nasal Swab  Result Value Ref Range Status   SARS Coronavirus 2 by RT PCR NEGATIVE NEGATIVE Final    Comment: (NOTE) SARS-CoV-2 target nucleic acids are NOT DETECTED.  The SARS-CoV-2 RNA is generally detectable in upper respiratory specimens during the acute phase of infection. The lowest concentration of SARS-CoV-2 viral copies this assay can detect is 138 copies/mL. A negative result does not preclude SARS-Cov-2 infection and should not be used as the sole basis for treatment or other patient  management decisions. A negative result may occur with  improper specimen collection/handling, submission of specimen other than nasopharyngeal swab, presence of viral mutation(s) within the areas targeted by this assay, and inadequate number of viral copies(<138 copies/mL). A negative result must be combined with clinical observations, patient history, and epidemiological information. The expected result is Negative.  Fact Sheet for Patients:  BloggerCourse.com  Fact Sheet for Healthcare Providers:  SeriousBroker.it  This test is no t yet approved or cleared by the [place] FDA and  has been authorized for detection and/or diagnosis of SARS-CoV-2 by FDA under an Emergency Use Authorization (EUA). This EUA will remain  in effect (meaning this test can be used) for the duration of the COVID-19 declaration under Section 564(b)(1) of the Act, 21 U.S.C.section 360bbb-3(b)(1), unless the authorization is terminated  or revoked sooner.       Influenza A by PCR NEGATIVE NEGATIVE Final   Influenza B by PCR NEGATIVE NEGATIVE Final    Comment: (NOTE) The Xpert Xpress SARS-CoV-2/FLU/RSV plus assay is intended as an aid in the diagnosis of influenza from Nasopharyngeal swab specimens and should not be used as a sole basis for treatment. Nasal washings and aspirates are unacceptable for  Xpert Xpress SARS-CoV-2/FLU/RSV testing.  Fact Sheet for Patients: BloggerCourse.com  Fact Sheet for Healthcare Providers: SeriousBroker.it  This test is not yet approved or cleared by the [place] FDA and has been authorized for detection and/or diagnosis of SARS-CoV-2 by FDA under an Emergency Use Authorization (EUA). This EUA will remain in effect (meaning this test can be used) for the duration of the COVID-19 declaration under Section 564(b)(1) of the Act, 21 U.S.C. section 360bbb-3(b)(1),  unless the authorization is terminated or revoked.     Resp Syncytial Virus by PCR POSITIVE (A) NEGATIVE Final    Comment: (NOTE) Fact Sheet for Patients: BloggerCourse.com  Fact Sheet for Healthcare Providers: SeriousBroker.it  This test is not yet approved or cleared by the [place] FDA and has been authorized for detection and/or diagnosis of SARS-CoV-2 by FDA under an Emergency Use Authorization (EUA). This EUA will remain in effect (meaning this test can be used) for the duration of the COVID-19 declaration under Section 564(b)(1) of the Act, 21 U.S.C. section 360bbb-3(b)(1), unless the authorization is terminated or revoked.  Performed at [facility], [address], [place], [place] [address]   Blood culture (routine x 2)     Status: None (Preliminary result)   Collection Time: 02/28/22  2:04 PM   Specimen: BLOOD  Result Value Ref Range Status   Specimen Description BLOOD BLOOD RIGHT ARM  Final   Special Requests   Final    BOTTLES DRAWN AEROBIC AND ANAEROBIC Blood Culture adequate volume BLOOD RIGHT ARM   Culture   Final    NO GROWTH 4 DAYS Performed at [facility], [address], [place], [place] [address]    Report Status PENDING  Incomplete  Blood culture (routine x 2)     Status: None (Preliminary result)   Collection Time: 02/28/22  2:04 PM   Specimen: BLOOD  Result Value Ref Range Status   Specimen Description BLOOD BLOOD LEFT ARM  Final   Special Requests   Final    BOTTLES DRAWN AEROBIC AND ANAEROBIC Blood Culture adequate volume BLOOD LEFT ARM   Culture   Final    NO GROWTH 4 DAYS Performed at [facility], [address], [place], [place] [address]    Report Status PENDING  Incomplete         Radiology Studies: No results found.      Scheduled Meds:  acetylcysteine  4 mL Nebulization TID   arformoterol  15 mcg Nebulization BID   ARIPiprazole  5 mg Oral Daily   budesonide (PULMICORT)  nebulizer solution  0.5 mg Nebulization BID   carvedilol  6.25 mg Oral BID WC   donepezil  10 mg Oral Daily   doxepin  50 mg Oral Daily   enoxaparin (LOVENOX) injection  30 mg Subcutaneous Q24H   ipratropium-albuterol  3 mL Nebulization QID   losartan  100 mg Oral Daily   methylPREDNISolone (SOLU-MEDROL) injection  40 mg Intravenous BID   mirtazapine  7.5 mg Oral QHS   ofloxacin  1 drop Right Eye QID   OLANZapine  10 mg Oral QHS   pantoprazole  40 mg Oral Daily   revefenacin  175 mcg Nebulization Daily   sodium chloride flush  3 mL Intravenous Q12H   temazepam  22.5 mg Oral QHS     LOS: 4 days    Time spent: 35 minutes    Analeia Ismael Hoover Brunette, DO [facility]  If 7PM-7AM, please contact night-coverage [email or web address] 03/04/2022, 10:34 AM

## 2022-03-05 ENCOUNTER — Inpatient Hospital Stay (HOSPITAL_COMMUNITY): Payer: Medicare Other

## 2022-03-05 DIAGNOSIS — J210 Acute bronchiolitis due to respiratory syncytial virus: Secondary | ICD-10-CM | POA: Diagnosis not present

## 2022-03-05 LAB — PROCALCITONIN: Procalcitonin: <0.1 ng/mL

## 2022-03-05 LAB — CBC
HCT: 43.8 % (ref 36.0–46.0)
Hemoglobin: 14.3 g/dL (ref 12.0–15.0)
MCH: 30.4 pg (ref 26.0–34.0)
MCHC: 32.6 g/dL (ref 30.0–36.0)
MCV: 93 fL (ref 80.0–100.0)
Platelets: 333 10*3/uL (ref 150–400)
RBC: 4.71 MIL/uL (ref 3.87–5.11)
RDW: 14.6 % (ref 11.5–15.5)
WBC: 15.1 10*3/uL — ABNORMAL HIGH (ref 4.0–10.5)
nRBC: 0 % (ref 0.0–0.2)

## 2022-03-05 LAB — CULTURE, BLOOD (ROUTINE X 2)
Culture: NO GROWTH
Culture: NO GROWTH

## 2022-03-05 LAB — BRAIN NATRIURETIC PEPTIDE: B Natriuretic Peptide: 46 pg/mL (ref 0.0–100.0)

## 2022-03-05 MED ORDER — PIPERACILLIN-TAZOBACTAM 3.375 G IVPB
3.3750 g | Freq: Once | INTRAVENOUS | Status: AC
  Administered 2022-03-05: 3.375 g via INTRAVENOUS
  Filled 2022-03-05: qty 50

## 2022-03-05 MED ORDER — PIPERACILLIN-TAZOBACTAM 3.375 G IVPB
3.3750 g | Freq: Three times a day (TID) | INTRAVENOUS | Status: DC
  Administered 2022-03-05 – 2022-03-08 (×9): 3.375 g via INTRAVENOUS
  Filled 2022-03-05 (×9): qty 50

## 2022-03-05 MED ORDER — IPRATROPIUM-ALBUTEROL 0.5-2.5 (3) MG/3ML IN SOLN
3.0000 mL | Freq: Two times a day (BID) | RESPIRATORY_TRACT | Status: DC
  Administered 2022-03-05 – 2022-03-07 (×5): 3 mL via RESPIRATORY_TRACT
  Filled 2022-03-05 (×4): qty 3

## 2022-03-05 NOTE — Progress Notes (Signed)
Pharmacy Antibiotic Note  [name] is a 71 y.o. female admitted on 02/28/2022 with  aspiration pneumonia .  Pharmacy has been consulted for zosyn dosing.  Plan: Zosyn 3.375g IV q8h (4 hour infusion).  Height: '5\' 3"'$  (160 cm) Weight: 44.4 kg (97 lb 14.2 oz) IBW/kg (Calculated) : 52.4  Temp (24hrs), Avg:97.9 F (36.6 C), Min:97.5 F (36.4 C), Max:98.6 F (37 C)  Recent Labs  Lab 02/28/22 1236 03/02/22 0452 03/03/22 0545 03/04/22 0514 03/05/22 0450  WBC 14.3*  --  13.7* 15.5* 15.1*  CREATININE 0.71 0.61 0.51  --   --     Estimated Creatinine Clearance: 45.9 mL/min (by C-G formula based on SCr of 0.51 mg/dL).    Allergies  Allergen Reactions   Tylenol [Acetaminophen] Other (See Comments)    Inflammed Liver   Codeine Nausea And Vomiting   Nsaids Other (See Comments)    Stomach ulcers   Tolmetin Other (See Comments)    Stomach ulcers   Tramadol Other (See Comments)    GI UPSET    Antimicrobials this admission: Zosyn 12/26 >>     Microbiology results: 12/21 BCx: pending RSV +  Thank you for allowing pharmacy to be a part of this patient's care.  Ramond Craver 03/05/2022 12:36 PM

## 2022-03-05 NOTE — Progress Notes (Signed)
[name]  GMW:[id number] DOB: 01-17-1951 DOA: 02/28/2022 PCP: Frederich Chick., MD   Brief Narrative:  Ms. Hockenberry is a 71 yo female with PMH COPD, arthritis, diverticulosis, GERD, HTN, CVA who presented with worsening shortness of breath, ongoing hypoxia, and generalized malaise. She was previously seen in the ER on 02/22/2022 with cough and shortness of breath as well.  She tested positive for RSV at that time.  She was treated with breathing treatments and prescribed doxycycline and prednisone along with oxygen. Due to not feeling better over the past few days, she presented back. Testing is again positive for RSV.  She remains hypoxic requiring 2 L.  CXR did not show any obvious infiltrates or effusions. She is admitted for IV steroids and further treatment.     Assessment & Plan:   Principal Problem:   RSV (acute bronchiolitis due to respiratory syncytial virus) Active Problems:   Acute respiratory failure with hypoxia (HCC)   Anxiety   GERD (gastroesophageal reflux disease)   Essential hypertension   Fibromyalgia   Chronic pain syndrome  Assessment and Plan:   RSV (acute bronchiolitis due to respiratory syncytial virus) - still RSV positive at time of admission. -Continue steroids, Pulmicort, Brovana, flutter valve and the use of mucolytic's/antitussive medication; started Yupelri 12/24 -Added Mucomyst 12/25 due to persistent congestion, start flutter valve -Continue supportive care and follow clinical response. -Wean off oxygen supplementation as tolerated.   Acute respiratory failure with hypoxia (HCC) -Due to ongoing RSV infection and COPD exacerbation and now possible aspiration - Continue to wean oxygen supplementation as tolerated -Continue treatment for COPD exacerbation as mentioned. -Add chest physiotherapy on 12/26 for worsening congestion  Possible aspiration pneumonia with right-sided infiltrate -Start Zosyn 12/26 for  empiric coverage -Follow CBC and check procalcitonin   Bacterial conjunctivitis to right eye-improving -Continue ofloxacin drops and monitor carefully   Chronic pain syndrome - Continue treatment with Subutex.   Fibromyalgia - Continue treatment with Subutex -Follow clinical response. -Continue holding tramadol currently.   Essential hypertension -Blood pressure soft, but is stable -[facility] -Continue antihypertensive agents currently.   GERD (gastroesophageal reflux disease) - Continue PPI.   Anxiety - Stable mood -Continue as needed benzodiazepines.     DVT prophylaxis:Lovenox Code Status: DNR Family Communication: None at bedside Disposition Plan:  Status is: Inpatient Remains inpatient appropriate because: IV medications     Consultants:  None   Procedures:  None   Antimicrobials:   Zosyn 12/26>   Subjective: Patient seen and evaluated today with worsening hypoxemia and chest congestion.  Chest x-ray with some signs of possible aspiration to the right lower lobe.  Objective: Vitals:   03/05/22 0539 03/05/22 0715 03/05/22 0818 03/05/22 1101  BP: 135/67  (!) 167/82   Pulse: 61  85 66  Resp: 19  20 16   Temp: 97.6 F (36.4 C)     TempSrc: Oral     SpO2: 93% 95% 91% 95%  Weight:      Height:       No intake or output data in the 24 hours ending 03/05/22 1150 Filed Weights   02/28/22 1211 02/28/22 2341  Weight: 46.3 kg 44.4 kg    Examination:  General exam: Appears calm and comfortable  Respiratory system: Significant bilateral congestion noted. Respiratory effort normal. 6L [place] Cardiovascular system: S1 & S2 heard, RRR.  Gastrointestinal system: Abdomen is soft Central nervous system: Alert and awake Extremities: No edema Skin:  No significant lesions noted Psychiatry: Flat affect.    Data Reviewed: I have personally reviewed following labs and imaging studies  CBC: Recent Labs  Lab 02/28/22 1236 03/03/22 0545  03/04/22 0514 03/05/22 0450  WBC 14.3* 13.7* 15.5* 15.1*  NEUTROABS 11.6*  --   --   --   HGB 14.8 13.3 14.0 14.3  HCT 46.3* 41.0 42.8 43.8  MCV 93.3 91.7 92.4 93.0  PLT 238 275 296 333   Basic Metabolic Panel: Recent Labs  Lab 02/28/22 1236 03/02/22 0452 03/03/22 0545  NA 144 143 143  K 3.9 4.1 4.0  CL 102 104 104  CO2 30 33* 32  GLUCOSE 113* 147* 144*  BUN 30* 32* 31*  CREATININE 0.71 0.61 0.51  CALCIUM 9.7 9.2 9.1  MG  --   --  1.9   GFR: Estimated Creatinine Clearance: 45.9 mL/min (by C-G formula based on SCr of 0.51 mg/dL). Liver Function Tests: Recent Labs  Lab 02/28/22 1236  AST 29  ALT 27  ALKPHOS 81  BILITOT 0.7  PROT 7.1  ALBUMIN 3.9   No results for input(s): "LIPASE", "AMYLASE" in the last 168 hours. No results for input(s): "AMMONIA" in the last 168 hours. Coagulation Profile: No results for input(s): "INR", "PROTIME" in the last 168 hours. Cardiac Enzymes: No results for input(s): "CKTOTAL", "CKMB", "CKMBINDEX", "TROPONINI" in the last 168 hours. BNP (last 3 results) No results for input(s): "PROBNP" in the last 8760 hours. HbA1C: No results for input(s): "HGBA1C" in the last 72 hours. CBG: No results for input(s): "GLUCAP" in the last 168 hours. Lipid Profile: No results for input(s): "CHOL", "HDL", "LDLCALC", "TRIG", "CHOLHDL", "LDLDIRECT" in the last 72 hours. Thyroid Function Tests: No results for input(s): "TSH", "T4TOTAL", "FREET4", "T3FREE", "THYROIDAB" in the last 72 hours. Anemia Panel: No results for input(s): "VITAMINB12", "FOLATE", "FERRITIN", "TIBC", "IRON", "RETICCTPCT" in the last 72 hours. Sepsis Labs: Recent Labs  Lab 02/28/22 1236 03/01/22 0435 03/02/22 0452  PROCALCITON <0.10 <0.10 <0.10    Recent Results (from the past 240 hour(s))  Resp panel by RT-PCR (RSV, Flu A&B, Covid) Anterior Nasal Swab     Status: Abnormal   Collection Time: 02/28/22 12:35 PM   Specimen: Anterior Nasal Swab  Result Value Ref Range Status    SARS Coronavirus 2 by RT PCR NEGATIVE NEGATIVE Final    Comment: (NOTE) SARS-CoV-2 target nucleic acids are NOT DETECTED.  The SARS-CoV-2 RNA is generally detectable in upper respiratory specimens during the acute phase of infection. The lowest concentration of SARS-CoV-2 viral copies this assay can detect is 138 copies/mL. A negative result does not preclude SARS-Cov-2 infection and should not be used as the sole basis for treatment or other patient management decisions. A negative result may occur with  improper specimen collection/handling, submission of specimen other than nasopharyngeal swab, presence of viral mutation(s) within the areas targeted by this assay, and inadequate number of viral copies(<138 copies/mL). A negative result must be combined with clinical observations, patient history, and epidemiological information. The expected result is Negative.  Fact Sheet for Patients:  BloggerCourse.com  Fact Sheet for Healthcare Providers:  SeriousBroker.it  This test is no t yet approved or cleared by the [place] FDA and  has been authorized for detection and/or diagnosis of SARS-CoV-2 by FDA under an Emergency Use Authorization (EUA). This EUA will remain  in effect (meaning this test can be used) for the duration of the COVID-19 declaration under Section 564(b)(1) of the Act, 21 U.S.C.section 360bbb-3(b)(1), unless  the authorization is terminated  or revoked sooner.       Influenza A by PCR NEGATIVE NEGATIVE Final   Influenza B by PCR NEGATIVE NEGATIVE Final    Comment: (NOTE) The Xpert Xpress SARS-CoV-2/FLU/RSV plus assay is intended as an aid in the diagnosis of influenza from Nasopharyngeal swab specimens and should not be used as a sole basis for treatment. Nasal washings and aspirates are unacceptable for Xpert Xpress SARS-CoV-2/FLU/RSV testing.  Fact Sheet for  Patients: BloggerCourse.com  Fact Sheet for Healthcare Providers: SeriousBroker.it  This test is not yet approved or cleared by the [place] FDA and has been authorized for detection and/or diagnosis of SARS-CoV-2 by FDA under an Emergency Use Authorization (EUA). This EUA will remain in effect (meaning this test can be used) for the duration of the COVID-19 declaration under Section 564(b)(1) of the Act, 21 U.S.C. section 360bbb-3(b)(1), unless the authorization is terminated or revoked.     Resp Syncytial Virus by PCR POSITIVE (A) NEGATIVE Final    Comment: (NOTE) Fact Sheet for Patients: BloggerCourse.com  Fact Sheet for Healthcare Providers: SeriousBroker.it  This test is not yet approved or cleared by the [place] FDA and has been authorized for detection and/or diagnosis of SARS-CoV-2 by FDA under an Emergency Use Authorization (EUA). This EUA will remain in effect (meaning this test can be used) for the duration of the COVID-19 declaration under Section 564(b)(1) of the Act, 21 U.S.C. section 360bbb-3(b)(1), unless the authorization is terminated or revoked.  Performed at [facility], [address] [place], [place] [address]   Blood culture (routine x 2)     Status: None   Collection Time: 02/28/22  2:04 PM   Specimen: BLOOD  Result Value Ref Range Status   Specimen Description BLOOD BLOOD RIGHT ARM  Final   Special Requests   Final    BOTTLES DRAWN AEROBIC AND ANAEROBIC Blood Culture adequate volume BLOOD RIGHT ARM   Culture   Final    NO GROWTH 5 DAYS Performed at [facility], [address] [place], [place] [address]    Report Status 03/05/2022 FINAL  Final  Blood culture (routine x 2)     Status: None   Collection Time: 02/28/22  2:04 PM   Specimen: BLOOD  Result Value Ref Range Status   Specimen Description BLOOD BLOOD LEFT ARM  Final   Special  Requests   Final    BOTTLES DRAWN AEROBIC AND ANAEROBIC Blood Culture adequate volume BLOOD LEFT ARM   Culture   Final    NO GROWTH 5 DAYS Performed at [facility] [place] [facility], [address] [place], [place] [address]    Report Status 03/05/2022 FINAL  Final         Radiology Studies: DG CHEST PORT 1 VIEW  Result Date: 03/05/2022 CLINICAL DATA:  Dyspnea. EXAM: PORTABLE CHEST 1 VIEW COMPARISON:  02/28/2022 FINDINGS: The heart size and mediastinal contours are within normal limits. Stable advanced emphysematous lung disease. Mildly increased parenchymal density in the lateral aspect of the right lung base could represent developing pneumonia. No overt pulmonary edema, pleural fluid or pneumothorax. The visualized skeletal structures are unremarkable. IMPRESSION: Possible developing pneumonia in the lateral aspect of the right lung base. Stable advanced emphysematous lung disease. Electronically Signed   By: Irish Lack M.D.   On: 03/05/2022 09:08        Scheduled Meds:  acetylcysteine  4 mL Nebulization TID   arformoterol  15 mcg Nebulization BID   ARIPiprazole  5  mg Oral Daily   budesonide (PULMICORT) nebulizer solution  0.5 mg Nebulization BID   carvedilol  6.25 mg Oral BID WC   donepezil  10 mg Oral Daily   doxepin  50 mg Oral Daily   enoxaparin (LOVENOX) injection  30 mg Subcutaneous Q24H   ipratropium-albuterol  3 mL Nebulization BID   losartan  100 mg Oral Daily   methylPREDNISolone (SOLU-MEDROL) injection  40 mg Intravenous BID   mirtazapine  7.5 mg Oral QHS   ofloxacin  1 drop Right Eye QID   OLANZapine  10 mg Oral QHS   pantoprazole  40 mg Oral Daily   revefenacin  175 mcg Nebulization Daily   sodium chloride flush  3 mL Intravenous Q12H   temazepam  22.5 mg Oral QHS   Continuous Infusions:  piperacillin-tazobactam       LOS: 5 days    Time spent: 35 minutes    Railyn House Hoover Brunette, DO [facility]  If 7PM-7AM, please contact  night-coverage [email or web address] 03/05/2022, 11:50 AM

## 2022-03-06 DIAGNOSIS — J210 Acute bronchiolitis due to respiratory syncytial virus: Secondary | ICD-10-CM | POA: Diagnosis not present

## 2022-03-06 LAB — MAGNESIUM: Magnesium: 1.9 mg/dL (ref 1.7–2.4)

## 2022-03-06 LAB — BASIC METABOLIC PANEL
Anion gap: 7 (ref 5–15)
BUN: 27 mg/dL — ABNORMAL HIGH (ref 8–23)
CO2: 33 mmol/L — ABNORMAL HIGH (ref 22–32)
Calcium: 8.9 mg/dL (ref 8.9–10.3)
Chloride: 103 mmol/L (ref 98–111)
Creatinine, Ser: 0.55 mg/dL (ref 0.44–1.00)
GFR, Estimated: >60 mL/min (ref >60)
Glucose, Bld: 146 mg/dL — ABNORMAL HIGH (ref 70–99)
Potassium: 3.8 mmol/L (ref 3.5–5.1)
Sodium: 143 mmol/L (ref 135–145)

## 2022-03-06 LAB — CBC
HCT: 43.7 % (ref 36.0–46.0)
Hemoglobin: 13.9 g/dL (ref 12.0–15.0)
MCH: 29.5 pg (ref 26.0–34.0)
MCHC: 31.8 g/dL (ref 30.0–36.0)
MCV: 92.8 fL (ref 80.0–100.0)
Platelets: 355 10*3/uL (ref 150–400)
RBC: 4.71 MIL/uL (ref 3.87–5.11)
RDW: 14.6 % (ref 11.5–15.5)
WBC: 20.6 10*3/uL — ABNORMAL HIGH (ref 4.0–10.5)
nRBC: 0 % (ref 0.0–0.2)

## 2022-03-06 NOTE — Progress Notes (Signed)
No acute events overnight. Elisa Kutner Eilene Voigt,RN 

## 2022-03-06 NOTE — Progress Notes (Signed)
PROGRESS NOTE    [name]  ZOX:[id number] DOB: 09-Oct-1950 DOA: 02/28/2022 PCP: Frederich Chick., MD   Brief Narrative:  Ms. Baza is a 71 yo female with PMH COPD, arthritis, diverticulosis, GERD, HTN, CVA who presented with worsening shortness of breath, ongoing hypoxia, and generalized malaise. She was previously seen in the ER on 02/22/2022 with cough and shortness of breath as well.  She tested positive for RSV at that time.  She was treated with breathing treatments and prescribed doxycycline and prednisone along with oxygen. Due to not feeling better over the past few days, she presented back. Testing is again positive for RSV.  She remains hypoxic requiring 2 L.  CXR did not show any obvious infiltrates or effusions. She is admitted for IV steroids and further treatment.     Assessment & Plan:   Principal Problem:   RSV (acute bronchiolitis due to respiratory syncytial virus) Active Problems:   Acute respiratory failure with hypoxia (HCC)   Anxiety   GERD (gastroesophageal reflux disease)   Essential hypertension   Fibromyalgia   Chronic pain syndrome  Assessment and Plan:   RSV (acute bronchiolitis due to respiratory syncytial virus) - still RSV positive at time of admission. -Continue steroids, Pulmicort, Brovana, flutter valve and the use of mucolytic's/antitussive medication; started Yupelri 12/24 -Added Mucomyst 12/25 due to persistent congestion, start flutter valve -Continue supportive care and follow clinical response. -Wean off oxygen supplementation as tolerated.   Acute respiratory failure with hypoxia (HCC) -Due to ongoing RSV infection and COPD exacerbation and now possible aspiration - Continue to wean oxygen supplementation as tolerated -Continue treatment for COPD exacerbation as mentioned. -Add chest physiotherapy on 12/26 for worsening congestion   Possible aspiration pneumonia with right-sided infiltrate -Start Zosyn 12/26 for  empiric coverage -Follow CBC and procal low   Bacterial conjunctivitis to right eye-improving -Continue ofloxacin drops and monitor carefully   Chronic pain syndrome - Continue treatment with Subutex.   Fibromyalgia - Continue treatment with Subutex -Follow clinical response. -Continue holding tramadol currently.   Essential hypertension -Blood pressure soft, but is stable -[facility] to follow [facility] -Continue antihypertensive agents currently.   GERD (gastroesophageal reflux disease) - Continue PPI.   Anxiety - Stable mood -Continue as needed benzodiazepines.     DVT prophylaxis:Lovenox Code Status: DNR Family Communication: None at bedside Disposition Plan:  Status is: Inpatient Remains inpatient appropriate because: IV medications     Consultants:  None   Procedures:  None   Antimicrobials:   Zosyn 12/26>   Subjective: Patient seen and evaluated today with some improvement in her congestion noted overall.  Still remains on 4 L nasal cannula.  Objective: Vitals:   03/05/22 1956 03/05/22 2307 03/06/22 0543 03/06/22 0722  BP: (!) 117/58  (!) 141/74   Pulse: 65 73 61   Resp: (!) 21  19   Temp: 98.8 F (37.1 C)  98.5 F (36.9 C)   TempSrc: Tympanic  Oral   SpO2: 92% 92% 94% 95%  Weight:      Height:        Intake/Output Summary (Last 24 hours) at 03/06/2022 1053 Last data filed at 03/06/2022 0900 Gross per 24 hour  Intake 915.05 ml  Output --  Net 915.05 ml   Filed Weights   02/28/22 1211 02/28/22 2341  Weight: 46.3 kg 44.4 kg    Examination:  General exam: Appears calm and comfortable  Respiratory system: Bilateral congestion. Respiratory effort normal.  4 L nasal cannula  Cardiovascular system: S1 & S2 heard, RRR.  Gastrointestinal system: Abdomen is soft Central nervous system: Alert and awake Extremities: No edema Skin: No significant lesions noted Psychiatry: Flat affect.    Data Reviewed: I have personally reviewed  following labs and imaging studies  CBC: Recent Labs  Lab 02/28/22 1236 03/03/22 0545 03/04/22 0514 03/05/22 0450 03/06/22 0415  WBC 14.3* 13.7* 15.5* 15.1* 20.6*  NEUTROABS 11.6*  --   --   --   --   HGB 14.8 13.3 14.0 14.3 13.9  HCT 46.3* 41.0 42.8 43.8 43.7  MCV 93.3 91.7 92.4 93.0 92.8  PLT 238 275 296 333 355   Basic Metabolic Panel: Recent Labs  Lab 02/28/22 1236 03/02/22 0452 03/03/22 0545 03/06/22 0415  NA 144 143 143 143  K 3.9 4.1 4.0 3.8  CL 102 104 104 103  CO2 30 33* 32 33*  GLUCOSE 113* 147* 144* 146*  BUN 30* 32* 31* 27*  CREATININE 0.71 0.61 0.51 0.55  CALCIUM 9.7 9.2 9.1 8.9  MG  --   --  1.9 1.9   GFR: Estimated Creatinine Clearance: 45.9 mL/min (by C-G formula based on SCr of 0.55 mg/dL). Liver Function Tests: Recent Labs  Lab 02/28/22 1236  AST 29  ALT 27  ALKPHOS 81  BILITOT 0.7  PROT 7.1  ALBUMIN 3.9   No results for input(s): "LIPASE", "AMYLASE" in the last 168 hours. No results for input(s): "AMMONIA" in the last 168 hours. Coagulation Profile: No results for input(s): "INR", "PROTIME" in the last 168 hours. Cardiac Enzymes: No results for input(s): "CKTOTAL", "CKMB", "CKMBINDEX", "TROPONINI" in the last 168 hours. BNP (last 3 results) No results for input(s): "PROBNP" in the last 8760 hours. HbA1C: No results for input(s): "HGBA1C" in the last 72 hours. CBG: No results for input(s): "GLUCAP" in the last 168 hours. Lipid Profile: No results for input(s): "CHOL", "HDL", "LDLCALC", "TRIG", "CHOLHDL", "LDLDIRECT" in the last 72 hours. Thyroid Function Tests: No results for input(s): "TSH", "T4TOTAL", "FREET4", "T3FREE", "THYROIDAB" in the last 72 hours. Anemia Panel: No results for input(s): "VITAMINB12", "FOLATE", "FERRITIN", "TIBC", "IRON", "RETICCTPCT" in the last 72 hours. Sepsis Labs: Recent Labs  Lab 02/28/22 1236 03/01/22 0435 03/02/22 0452 03/05/22 0550  PROCALCITON <0.10 <0.10 <0.10 <0.10    Recent Results (from  the past 240 hour(s))  Resp panel by RT-PCR (RSV, Flu A&B, Covid) Anterior Nasal Swab     Status: Abnormal   Collection Time: 02/28/22 12:35 PM   Specimen: Anterior Nasal Swab  Result Value Ref Range Status   SARS Coronavirus 2 by RT PCR NEGATIVE NEGATIVE Final    Comment: (NOTE) SARS-CoV-2 target nucleic acids are NOT DETECTED.  The SARS-CoV-2 RNA is generally detectable in upper respiratory specimens during the acute phase of infection. The lowest concentration of SARS-CoV-2 viral copies this assay can detect is 138 copies/mL. A negative result does not preclude SARS-Cov-2 infection and should not be used as the sole basis for treatment or other patient management decisions. A negative result may occur with  improper specimen collection/handling, submission of specimen other than nasopharyngeal swab, presence of viral mutation(s) within the areas targeted by this assay, and inadequate number of viral copies(<138 copies/mL). A negative result must be combined with clinical observations, patient history, and epidemiological information. The expected result is Negative.  Fact Sheet for Patients:  BloggerCourse.com  Fact Sheet for Healthcare Providers:  SeriousBroker.it  This test is no t yet approved or cleared by the [place] and  has been authorized for detection and/or diagnosis of SARS-CoV-2 by FDA under an Emergency Use Authorization (EUA). This EUA will remain  in effect (meaning this test can be used) for the duration of the COVID-19 declaration under Section 564(b)(1) of the Act, 21 U.S.C.section 360bbb-3(b)(1), unless the authorization is terminated  or revoked sooner.       Influenza A by PCR NEGATIVE NEGATIVE Final   Influenza B by PCR NEGATIVE NEGATIVE Final    Comment: (NOTE) The Xpert Xpress SARS-CoV-2/FLU/RSV plus assay is intended as an aid in the diagnosis of influenza from Nasopharyngeal swab  specimens and should not be used as a sole basis for treatment. Nasal washings and aspirates are unacceptable for Xpert Xpress SARS-CoV-2/FLU/RSV testing.  Fact Sheet for Patients: BloggerCourse.com  Fact Sheet for Healthcare Providers: SeriousBroker.it  This test is not yet approved or cleared by the [place] FDA and has been authorized for detection and/or diagnosis of SARS-CoV-2 by FDA under an Emergency Use Authorization (EUA). This EUA will remain in effect (meaning this test can be used) for the duration of the COVID-19 declaration under Section 564(b)(1) of the Act, 21 U.S.C. section 360bbb-3(b)(1), unless the authorization is terminated or revoked.     Resp Syncytial Virus by PCR POSITIVE (A) NEGATIVE Final    Comment: (NOTE) Fact Sheet for Patients: BloggerCourse.com  Fact Sheet for Healthcare Providers: SeriousBroker.it  This test is not yet approved or cleared by the [place] FDA and has been authorized for detection and/or diagnosis of SARS-CoV-2 by FDA under an Emergency Use Authorization (EUA). This EUA will remain in effect (meaning this test can be used) for the duration of the COVID-19 declaration under Section 564(b)(1) of the Act, 21 U.S.C. section 360bbb-3(b)(1), unless the authorization is terminated or revoked.  Performed at [facility], [address], [place], [place] [address]   Blood culture (routine x 2)     Status: None   Collection Time: 02/28/22  2:04 PM   Specimen: BLOOD  Result Value Ref Range Status   Specimen Description BLOOD BLOOD RIGHT ARM  Final   Special Requests   Final    BOTTLES DRAWN AEROBIC AND ANAEROBIC Blood Culture adequate volume BLOOD RIGHT ARM   Culture   Final    NO GROWTH 5 DAYS Performed at [facility], [address] [place], [place] [address]    Report Status 03/05/2022 FINAL  Final  Blood culture (routine  x 2)     Status: None   Collection Time: 02/28/22  2:04 PM   Specimen: BLOOD  Result Value Ref Range Status   Specimen Description BLOOD BLOOD LEFT ARM  Final   Special Requests   Final    BOTTLES DRAWN AEROBIC AND ANAEROBIC Blood Culture adequate volume BLOOD LEFT ARM   Culture   Final    NO GROWTH 5 DAYS Performed at [facility], [address] [place], [place] [address]    Report Status 03/05/2022 FINAL  Final         Radiology Studies: DG CHEST PORT 1 VIEW  Result Date: 03/05/2022 CLINICAL DATA:  Dyspnea. EXAM: PORTABLE CHEST 1 VIEW COMPARISON:  02/28/2022 FINDINGS: The heart size and mediastinal contours are within normal limits. Stable advanced emphysematous lung disease. Mildly increased parenchymal density in the lateral aspect of the right lung base could represent developing pneumonia. No overt pulmonary edema, pleural fluid or pneumothorax. The visualized skeletal structures are unremarkable. IMPRESSION: Possible developing pneumonia in the lateral aspect of the right lung base. Stable  advanced emphysematous lung disease. Electronically Signed   By: Irish Lack M.D.   On: 03/05/2022 09:08        Scheduled Meds:  acetylcysteine  4 mL Nebulization TID   arformoterol  15 mcg Nebulization BID   ARIPiprazole  5 mg Oral Daily   budesonide (PULMICORT) nebulizer solution  0.5 mg Nebulization BID   carvedilol  6.25 mg Oral BID WC   donepezil  10 mg Oral Daily   doxepin  50 mg Oral Daily   enoxaparin (LOVENOX) injection  30 mg Subcutaneous Q24H   ipratropium-albuterol  3 mL Nebulization BID   losartan  100 mg Oral Daily   methylPREDNISolone (SOLU-MEDROL) injection  40 mg Intravenous BID   mirtazapine  7.5 mg Oral QHS   ofloxacin  1 drop Right Eye QID   OLANZapine  10 mg Oral QHS   pantoprazole  40 mg Oral Daily   revefenacin  175 mcg Nebulization Daily   sodium chloride flush  3 mL Intravenous Q12H   temazepam  22.5 mg Oral QHS   Continuous Infusions:   piperacillin-tazobactam (ZOSYN)  IV 3.375 g (03/06/22 0337)     LOS: 6 days    Time spent: 35 minutes    Mainor Hellmann Hoover Brunette, DO [facility]  If 7PM-7AM, please contact night-coverage [email or web address] 03/06/2022, 10:53 AM

## 2022-03-06 NOTE — Progress Notes (Signed)
No acute events over night. Pt remained on 4 L [place] and maintained SPO2 92-94%. She ambulated to restroom with steady gait. Bryson Corona Edd Fabian

## 2022-03-07 DIAGNOSIS — J210 Acute bronchiolitis due to respiratory syncytial virus: Secondary | ICD-10-CM | POA: Diagnosis not present

## 2022-03-07 LAB — BASIC METABOLIC PANEL
Anion gap: 7 (ref 5–15)
BUN: 29 mg/dL — ABNORMAL HIGH (ref 8–23)
CO2: 33 mmol/L — ABNORMAL HIGH (ref 22–32)
Calcium: 8.9 mg/dL (ref 8.9–10.3)
Chloride: 103 mmol/L (ref 98–111)
Creatinine, Ser: 0.54 mg/dL (ref 0.44–1.00)
GFR, Estimated: >60 mL/min (ref >60)
Glucose, Bld: 122 mg/dL — ABNORMAL HIGH (ref 70–99)
Potassium: 3.8 mmol/L (ref 3.5–5.1)
Sodium: 143 mmol/L (ref 135–145)

## 2022-03-07 LAB — MAGNESIUM: Magnesium: 1.9 mg/dL (ref 1.7–2.4)

## 2022-03-07 LAB — CBC
HCT: 42.8 % (ref 36.0–46.0)
Hemoglobin: 13.7 g/dL (ref 12.0–15.0)
MCH: 29.5 pg (ref 26.0–34.0)
MCHC: 32 g/dL (ref 30.0–36.0)
MCV: 92 fL (ref 80.0–100.0)
Platelets: 390 10*3/uL (ref 150–400)
RBC: 4.65 MIL/uL (ref 3.87–5.11)
RDW: 14.6 % (ref 11.5–15.5)
WBC: 17.6 10*3/uL — ABNORMAL HIGH (ref 4.0–10.5)
nRBC: 0 % (ref 0.0–0.2)

## 2022-03-07 MED ORDER — IPRATROPIUM-ALBUTEROL 0.5-2.5 (3) MG/3ML IN SOLN
3.0000 mL | Freq: Four times a day (QID) | RESPIRATORY_TRACT | Status: DC
  Administered 2022-03-07 – 2022-03-08 (×4): 3 mL via RESPIRATORY_TRACT
  Filled 2022-03-07 (×4): qty 3

## 2022-03-07 MED ORDER — BUDESONIDE 0.5 MG/2ML IN SUSP
0.5000 mg | Freq: Three times a day (TID) | RESPIRATORY_TRACT | Status: DC
  Administered 2022-03-07 – 2022-03-08 (×4): 0.5 mg via RESPIRATORY_TRACT
  Filled 2022-03-07 (×4): qty 2

## 2022-03-07 NOTE — Progress Notes (Signed)
[name] from [employer] called for update on pt.. Verified  HIPAA

## 2022-03-07 NOTE — Progress Notes (Addendum)
Pt needed 4l to remain above 90% O2 PT AMBULATED TO BATHROOM AND AROUND ROOM 15 FT  03/07/22 0859  Vitals  Temp (!) 97.4 F (36.3 C)  Temp Source Oral  BP (!) 129/103  MAP (mmHg) 107  BP Location Right Arm  BP Method Automatic  Patient Position (if appropriate) Sitting  Pulse Rate 85  Pulse Rate Source [place]  ECG Heart Rate 76  Resp 18  Level of Consciousness  Level of Consciousness Alert  Oxygen Therapy  SpO2 90 %  O2 Device Nasal Cannula  O2 Flow Rate (L/min) 3 L/min  Pain Assessment  Pain Scale 0-10  Pain Score 0

## 2022-03-07 NOTE — Progress Notes (Addendum)
Pt pleasant and cooperative this shift. O2 sats remain stable on 4L [place]. No c/o pain or discomfort. All needs met. Call bell in reach. Plan of care ongoing.

## 2022-03-07 NOTE — Progress Notes (Signed)
PROGRESS NOTE    [name]  GNF:[id number] DOB: 1950-08-21 DOA: 02/28/2022 PCP: Frederich Chick., MD   Brief Narrative:  [name] is a 71 yo female with PMH COPD, arthritis, diverticulosis, GERD, HTN, CVA who presented with worsening shortness of breath, ongoing hypoxia, and generalized malaise. She was previously seen in the ER on 02/22/2022 with cough and shortness of breath as well.  She tested positive for RSV at that time.  She was treated with breathing treatments and prescribed doxycycline and prednisone along with oxygen. Due to not feeling better over the past few days, she presented back. Testing is again positive for RSV.  She remains hypoxic requiring 2 L.  CXR did not show any obvious infiltrates or effusions. She is admitted for IV steroids and further treatment.     Assessment & Plan:   Principal Problem:   RSV (acute bronchiolitis due to respiratory syncytial virus) Active Problems:   Acute respiratory failure with hypoxia (HCC)   Anxiety   GERD (gastroesophageal reflux disease)   Essential hypertension   Fibromyalgia   Chronic pain syndrome  Assessment and Plan:   RSV (acute bronchiolitis due to respiratory syncytial virus) - still RSV positive at time of admission. -Continue steroids, Pulmicort, flutter valve and the use of mucolytic's/antitussive medication -Added Mucomyst 12/25 due to persistent congestion, start flutter valve; still with severe congestion -Continue supportive care and follow clinical response. -Wean off oxygen supplementation as tolerated.   Acute respiratory failure with hypoxia (HCC) -Due to ongoing RSV infection and COPD exacerbation and now possible aspiration - Continue to wean oxygen supplementation as tolerated -Continue treatment for COPD exacerbation as mentioned. -Add chest physiotherapy on 12/26 for worsening congestion -Continue to wean O2 -Require some more ambulation and PT evaluation ordered 12/28    Possible aspiration pneumonia with right-sided infiltrate -Start Zosyn 12/26 for empiric coverage -Follow CBC and procal low   Bacterial conjunctivitis to right eye-improving -Continue ofloxacin drops and monitor carefully   Chronic pain syndrome - Continue treatment with Subutex.   Fibromyalgia - Continue treatment with Subutex -Follow clinical response. -Continue holding tramadol currently.   Essential hypertension -Blood pressure soft, but is stable -[facility] -Continue antihypertensive agents currently.   GERD (gastroesophageal reflux disease) - Continue PPI.   Anxiety - Stable mood -Continue as needed benzodiazepines.     DVT prophylaxis:Lovenox Code Status: DNR Family Communication: None at bedside Disposition Plan:  Status is: Inpatient Remains inpatient appropriate because: IV medications     Consultants:  None   Procedures:  None   Antimicrobials:   Zosyn 12/26>    Subjective: Patient seen and evaluated today with ongoing significant congestion and shortness of breath with wheezing.  Likely will  Objective: Vitals:   03/07/22 0732 03/07/22 0737 03/07/22 0743 03/07/22 0859  BP:    (!) 129/103  Pulse:    85  Resp:    18  Temp:    (!) 97.4 F (36.3 C)  TempSrc:    Oral  SpO2: 96% 96% 96% 90%  Weight:      Height:        Intake/Output Summary (Last 24 hours) at 03/07/2022 1218 Last data filed at 03/07/2022 0900 Gross per 24 hour  Intake 840 ml  Output --  Net 840 ml   Filed Weights   02/28/22 1211 02/28/22 2341  Weight: 46.3 kg 44.4 kg    Examination:  General exam: Appears calm and comfortable  Respiratory system: Bilateral congestion. Respiratory  effort normal.  4 L nasal cannula Cardiovascular system: S1 & S2 heard, RRR.  Gastrointestinal system: Abdomen is soft Central nervous system: Alert and awake Extremities: No edema Skin: No significant lesions noted Psychiatry: Flat affect.    Data  Reviewed: I have personally reviewed following labs and imaging studies  CBC: Recent Labs  Lab 02/28/22 1236 03/03/22 0545 03/04/22 0514 03/05/22 0450 03/06/22 0415 03/07/22 0402  WBC 14.3* 13.7* 15.5* 15.1* 20.6* 17.6*  NEUTROABS 11.6*  --   --   --   --   --   HGB 14.8 13.3 14.0 14.3 13.9 13.7  HCT 46.3* 41.0 42.8 43.8 43.7 42.8  MCV 93.3 91.7 92.4 93.0 92.8 92.0  PLT 238 275 296 333 355 390   Basic Metabolic Panel: Recent Labs  Lab 02/28/22 1236 03/02/22 0452 03/03/22 0545 03/06/22 0415 03/07/22 0402  NA 144 143 143 143 143  K 3.9 4.1 4.0 3.8 3.8  CL 102 104 104 103 103  CO2 30 33* 32 33* 33*  GLUCOSE 113* 147* 144* 146* 122*  BUN 30* 32* 31* 27* 29*  CREATININE 0.71 0.61 0.51 0.55 0.54  CALCIUM 9.7 9.2 9.1 8.9 8.9  MG  --   --  1.9 1.9 1.9   GFR: Estimated Creatinine Clearance: 45.9 mL/min (by C-G formula based on SCr of 0.54 mg/dL). Liver Function Tests: Recent Labs  Lab 02/28/22 1236  AST 29  ALT 27  ALKPHOS 81  BILITOT 0.7  PROT 7.1  ALBUMIN 3.9   No results for input(s): "LIPASE", "AMYLASE" in the last 168 hours. No results for input(s): "AMMONIA" in the last 168 hours. Coagulation Profile: No results for input(s): "INR", "PROTIME" in the last 168 hours. Cardiac Enzymes: No results for input(s): "CKTOTAL", "CKMB", "CKMBINDEX", "TROPONINI" in the last 168 hours. BNP (last 3 results) No results for input(s): "PROBNP" in the last 8760 hours. HbA1C: No results for input(s): "HGBA1C" in the last 72 hours. CBG: No results for input(s): "GLUCAP" in the last 168 hours. Lipid Profile: No results for input(s): "CHOL", "HDL", "LDLCALC", "TRIG", "CHOLHDL", "LDLDIRECT" in the last 72 hours. Thyroid Function Tests: No results for input(s): "TSH", "T4TOTAL", "FREET4", "T3FREE", "THYROIDAB" in the last 72 hours. Anemia Panel: No results for input(s): "VITAMINB12", "FOLATE", "FERRITIN", "TIBC", "IRON", "RETICCTPCT" in the last 72 hours. Sepsis Labs: Recent  Labs  Lab 02/28/22 1236 03/01/22 0435 03/02/22 0452 03/05/22 0550  PROCALCITON <0.10 <0.10 <0.10 <0.10    Recent Results (from the past 240 hour(s))  Resp panel by RT-PCR (RSV, Flu A&B, Covid) Anterior Nasal Swab     Status: Abnormal   Collection Time: 02/28/22 12:35 PM   Specimen: Anterior Nasal Swab  Result Value Ref Range Status   SARS Coronavirus 2 by RT PCR NEGATIVE NEGATIVE Final    Comment: (NOTE) SARS-CoV-2 target nucleic acids are NOT DETECTED.  The SARS-CoV-2 RNA is generally detectable in upper respiratory specimens during the acute phase of infection. The lowest concentration of SARS-CoV-2 viral copies this assay can detect is 138 copies/mL. A negative result does not preclude SARS-Cov-2 infection and should not be used as the sole basis for treatment or other patient management decisions. A negative result may occur with  improper specimen collection/handling, submission of specimen other than nasopharyngeal swab, presence of viral mutation(s) within the areas targeted by this assay, and inadequate number of viral copies(<138 copies/mL). A negative result must be combined with clinical observations, patient history, and epidemiological information. The expected result is Negative.  Fact Sheet for  Patients:  BloggerCourse.com  Fact Sheet for Healthcare Providers:  SeriousBroker.it  This test is no t yet approved or cleared by the [place] FDA and  has been authorized for detection and/or diagnosis of SARS-CoV-2 by FDA under an Emergency Use Authorization (EUA). This EUA will remain  in effect (meaning this test can be used) for the duration of the COVID-19 declaration under Section 564(b)(1) of the Act, 21 U.S.C.section 360bbb-3(b)(1), unless the authorization is terminated  or revoked sooner.       Influenza A by PCR NEGATIVE NEGATIVE Final   Influenza B by PCR NEGATIVE NEGATIVE Final    Comment:  (NOTE) The Xpert Xpress SARS-CoV-2/FLU/RSV plus assay is intended as an aid in the diagnosis of influenza from Nasopharyngeal swab specimens and should not be used as a sole basis for treatment. Nasal washings and aspirates are unacceptable for Xpert Xpress SARS-CoV-2/FLU/RSV testing.  Fact Sheet for Patients: BloggerCourse.com  Fact Sheet for Healthcare Providers: SeriousBroker.it  This test is not yet approved or cleared by the [place] FDA and has been authorized for detection and/or diagnosis of SARS-CoV-2 by FDA under an Emergency Use Authorization (EUA). This EUA will remain in effect (meaning this test can be used) for the duration of the COVID-19 declaration under Section 564(b)(1) of the Act, 21 U.S.C. section 360bbb-3(b)(1), unless the authorization is terminated or revoked.     Resp Syncytial Virus by PCR POSITIVE (A) NEGATIVE Final    Comment: (NOTE) Fact Sheet for Patients: BloggerCourse.com  Fact Sheet for Healthcare Providers: SeriousBroker.it  This test is not yet approved or cleared by the [place] FDA and has been authorized for detection and/or diagnosis of SARS-CoV-2 by FDA under an Emergency Use Authorization (EUA). This EUA will remain in effect (meaning this test can be used) for the duration of the COVID-19 declaration under Section 564(b)(1) of the Act, 21 U.S.C. section 360bbb-3(b)(1), unless the authorization is terminated or revoked.  Performed at [facility], [address] [place], [place] [address]   Blood culture (routine x 2)     Status: None   Collection Time: 02/28/22  2:04 PM   Specimen: BLOOD  Result Value Ref Range Status   Specimen Description BLOOD BLOOD RIGHT ARM  Final   Special Requests   Final    BOTTLES DRAWN AEROBIC AND ANAEROBIC Blood Culture adequate volume BLOOD RIGHT ARM   Culture   Final    NO GROWTH 5  DAYS Performed at [facility], [address] [place], [place] [address]    Report Status 03/05/2022 FINAL  Final  Blood culture (routine x 2)     Status: None   Collection Time: 02/28/22  2:04 PM   Specimen: BLOOD  Result Value Ref Range Status   Specimen Description BLOOD BLOOD LEFT ARM  Final   Special Requests   Final    BOTTLES DRAWN AEROBIC AND ANAEROBIC Blood Culture adequate volume BLOOD LEFT ARM   Culture   Final    NO GROWTH 5 DAYS Performed at [facility], [address], [place], [place] [address]    Report Status 03/05/2022 FINAL  Final         Radiology Studies: No results found.      Scheduled Meds:  acetylcysteine  4 mL Nebulization TID   ARIPiprazole  5 mg Oral Daily   budesonide (PULMICORT) nebulizer solution  0.5 mg Nebulization TID   carvedilol  6.25 mg Oral BID WC   donepezil  10 mg Oral Daily  doxepin  50 mg Oral Daily   enoxaparin (LOVENOX) injection  30 mg Subcutaneous Q24H   ipratropium-albuterol  3 mL Nebulization Q6H   losartan  100 mg Oral Daily   methylPREDNISolone (SOLU-MEDROL) injection  40 mg Intravenous BID   mirtazapine  7.5 mg Oral QHS   ofloxacin  1 drop Right Eye QID   OLANZapine  10 mg Oral QHS   pantoprazole  40 mg Oral Daily   sodium chloride flush  3 mL Intravenous Q12H   temazepam  22.5 mg Oral QHS   Continuous Infusions:  piperacillin-tazobactam (ZOSYN)  IV 3.375 g (03/07/22 0534)     LOS: 7 days    Time spent: 35 minutes    Marcena Dias Hoover Brunette, DO [facility]  If 7PM-7AM, please contact night-coverage [email or web address] 03/07/2022, 12:18 PM

## 2022-03-08 DIAGNOSIS — J210 Acute bronchiolitis due to respiratory syncytial virus: Secondary | ICD-10-CM | POA: Diagnosis not present

## 2022-03-08 LAB — BASIC METABOLIC PANEL
Anion gap: 7 (ref 5–15)
BUN: 26 mg/dL — ABNORMAL HIGH (ref 8–23)
CO2: 31 mmol/L (ref 22–32)
Calcium: 8.6 mg/dL — ABNORMAL LOW (ref 8.9–10.3)
Chloride: 104 mmol/L (ref 98–111)
Creatinine, Ser: 0.41 mg/dL — ABNORMAL LOW (ref 0.44–1.00)
GFR, Estimated: >60 mL/min (ref >60)
Glucose, Bld: 125 mg/dL — ABNORMAL HIGH (ref 70–99)
Potassium: 3.6 mmol/L (ref 3.5–5.1)
Sodium: 142 mmol/L (ref 135–145)

## 2022-03-08 LAB — MAGNESIUM: Magnesium: 2.1 mg/dL (ref 1.7–2.4)

## 2022-03-08 LAB — CBC
HCT: 41.6 % (ref 36.0–46.0)
Hemoglobin: 13.5 g/dL (ref 12.0–15.0)
MCH: 29.5 pg (ref 26.0–34.0)
MCHC: 32.5 g/dL (ref 30.0–36.0)
MCV: 91 fL (ref 80.0–100.0)
Platelets: 383 10*3/uL (ref 150–400)
RBC: 4.57 MIL/uL (ref 3.87–5.11)
RDW: 14.4 % (ref 11.5–15.5)
WBC: 15.8 10*3/uL — ABNORMAL HIGH (ref 4.0–10.5)
nRBC: 0 % (ref 0.0–0.2)

## 2022-03-08 MED ORDER — TEMAZEPAM 22.5 MG PO CAPS: 22.5000 mg | ORAL_CAPSULE | Freq: Every day | ORAL | 0 refills | Status: DC

## 2022-03-08 MED ORDER — BUPRENORPHINE HCL 2 MG SL SUBL: 4.0000 mg | SUBLINGUAL_TABLET | Freq: Four times a day (QID) | SUBLINGUAL | 0 refills | Status: DC | PRN

## 2022-03-08 MED ORDER — TRAMADOL HCL 50 MG PO TABS: 50.0000 mg | ORAL_TABLET | ORAL | 0 refills | Status: DC | PRN

## 2022-03-08 MED ORDER — AMOXICILLIN-POT CLAVULANATE 500-125 MG PO TABS: 1.0000 | ORAL_TABLET | Freq: Three times a day (TID) | ORAL | 0 refills | Status: DC

## 2022-03-08 MED ORDER — AMOXICILLIN-POT CLAVULANATE 500-125 MG PO TABS: 1.0000 | ORAL_TABLET | Freq: Three times a day (TID) | ORAL | 0 refills | Status: AC

## 2022-03-08 MED ORDER — PREDNISONE 10 MG PO TABS: 40.0000 mg | ORAL_TABLET | Freq: Every day | ORAL | 0 refills | Status: AC

## 2022-03-08 MED ORDER — OLANZAPINE 10 MG PO TABS: 10.0000 mg | ORAL_TABLET | Freq: Every day | ORAL | 0 refills | Status: AC

## 2022-03-08 MED ORDER — PREDNISONE 10 MG PO TABS: 40.0000 mg | ORAL_TABLET | Freq: Every day | ORAL | 0 refills | Status: DC

## 2022-03-08 MED ORDER — LORAZEPAM 0.5 MG PO TABS: 0.5000 mg | ORAL_TABLET | Freq: Two times a day (BID) | ORAL | 0 refills | Status: DC | PRN

## 2022-03-08 MED ORDER — OFLOXACIN 0.3 % OP SOLN: 1.0000 [drp] | Freq: Four times a day (QID) | OPHTHALMIC | 0 refills | Status: AC

## 2022-03-08 MED ORDER — OFLOXACIN 0.3 % OP SOLN: 1.0000 [drp] | Freq: Four times a day (QID) | OPHTHALMIC | 0 refills | Status: DC

## 2022-03-08 MED ORDER — DOXEPIN HCL 50 MG PO CAPS: 50.0000 mg | ORAL_CAPSULE | Freq: Every day | ORAL | 0 refills | Status: DC

## 2022-03-08 NOTE — Progress Notes (Signed)
Pt AVS was given and reviewed and left at bedside with pt for DC and group home staff to pick up

## 2022-03-08 NOTE — Care Management Important Message (Signed)
Important Message  Patient Details  Name: [name] MRN: [id number] Date of Birth: 12-24-1950   Medicare Important Message Given:  Yes (spoke with patient at [phone] to review letter, no additional copy needed)     Tommy Medal 03/08/2022, 3:15 PM

## 2022-03-08 NOTE — Evaluation (Signed)
Physical Therapy Evaluation Patient Details Name: [name] MRN: [id number] DOB: 02/09/1951 Today's Date: 03/08/2022  History of Present Illness  [name] is a 71 yo female with PMH COPD, arthritis, diverticulosis, GERD, HTN, CVA who presented with worsening shortness of breath, ongoing hypoxia, and generalized malaise.  She was previously seen in the ER on 02/22/2022 with cough and shortness of breath as well.  She tested positive for RSV at that time.  She was treated with breathing treatments and prescribed doxycycline and prednisone along with oxygen.  Due to not feeling better over the past few days, she presented back.  Testing is again positive for RSV.  She remains hypoxic requiring 2 L.  CXR did not show any obvious infiltrates or effusions.  She is admitted for IV steroids and further treatment.   Clinical Impression  Patient functioning near baseline for functional mobility and gait other than requiring use of RW for safety due to generalized weakness and having to lean on nearby objects for support when not using an AD.  Patient able to ambulate in room safely using RW, limited mostly due to fatigue and tolerated sitting up in chair after therapy with SpO2 at 90% - RN notified.  Patient will benefit from continued skilled physical therapy in hospital and recommended venue below to increase strength, balance, endurance for safe ADLs and gait.         Recommendations for follow up therapy are one component of a multi-disciplinary discharge planning process, led by the attending physician.  Recommendations may be updated based on patient status, additional functional criteria and insurance authorization.  Follow Up Recommendations Home health PT      Assistance Recommended at Discharge Set up Supervision/Assistance  Patient can return home with the following  A little help with walking and/or transfers;A little help with bathing/dressing/bathroom;Help with stairs or ramp for  entrance;Assistance with cooking/housework    Equipment Recommendations None recommended by PT  Recommendations for Other Services       Functional Status Assessment Patient has had a recent decline in their functional status and demonstrates the ability to make significant improvements in function in a reasonable and predictable amount of time.     Precautions / Restrictions Precautions Precautions: Fall Restrictions Weight Bearing Restrictions: No      Mobility  Bed Mobility Overal bed mobility: Modified Independent                  Transfers Overall transfer level: Needs assistance Equipment used: Rolling walker (2 wheels), None Transfers: Sit to/from Stand, Bed to chair/wheelchair/BSC Sit to Stand: Supervision   Step pivot transfers: Supervision       General transfer comment: has to lean on armrest of chair when not using an AD, safer using RW    Ambulation/Gait Ambulation/Gait assistance: Supervision, Min guard Gait Distance (Feet): 25 Feet Assistive device: Rolling walker (2 wheels) Gait Pattern/deviations: Decreased step length - right, Decreased step length - left, Decreased stride length Gait velocity: decreased     General Gait Details: slow labored movement without loss of balance, had to take a sitting rest break before making it back to bedside, on room air with SpO2 at 90%  Stairs            Wheelchair Mobility    Modified Rankin (Stroke Patients Only)       Balance Overall balance assessment: Needs assistance Sitting-balance support: Feet supported, No upper extremity supported Sitting balance-Leahy Scale: Good Sitting balance - Comments: seated at  EOB   Standing balance support: During functional activity, No upper extremity supported Standing balance-Leahy Scale: Poor Standing balance comment: fair/poor without AD, fair/good using RW                             Pertinent Vitals/Pain Pain Assessment Pain  Assessment: No/denies pain    Home Living Family/patient expects to be discharged to:: Assisted living                 Home Equipment: Conservation officer, nature (2 wheels);Cane - single point;Wheelchair - manual      Prior Function Prior Level of Function : Needs assist       Physical Assist : Mobility (physical);ADLs (physical) Mobility (physical): Bed mobility;Transfers;Gait;Stairs   Mobility Comments: household ambulator without use of AD most of time, uses SPC or RW whe needed ADLs Comments: assisted by ALF staff     Hand Dominance   Dominant Hand: Right    Extremity/Trunk Assessment   Upper Extremity Assessment Upper Extremity Assessment: Overall WFL for tasks assessed    Lower Extremity Assessment Lower Extremity Assessment: Generalized weakness    Cervical / Trunk Assessment Cervical / Trunk Assessment: Normal  Communication   Communication: No difficulties  Cognition Arousal/Alertness: Awake/alert Behavior During Therapy: WFL for tasks assessed/performed Overall Cognitive Status: Within Functional Limits for tasks assessed                                          General Comments      Exercises     Assessment/Plan    PT Assessment Patient needs continued PT services  PT Problem List Decreased strength;Decreased activity tolerance;Decreased balance;Decreased mobility       PT Treatment Interventions DME instruction;Gait training;Stair training;Functional mobility training;Therapeutic activities;Balance training;Patient/family education    PT Goals (Current goals can be found in the Care Plan section)  Acute Rehab PT Goals Patient Stated Goal: return home with ALF staff to assist PT Goal Formulation: With patient Time For Goal Achievement: 03/11/22 Potential to Achieve Goals: Good    Frequency Min 3X/week     Co-evaluation               AM-PAC PT "6 Clicks" Mobility  Outcome Measure Help needed turning from your back to  your side while in a flat bed without using bedrails?: None Help needed moving from lying on your back to sitting on the side of a flat bed without using bedrails?: None Help needed moving to and from a bed to a chair (including a wheelchair)?: A Little Help needed standing up from a chair using your arms (e.g., wheelchair or bedside chair)?: A Little Help needed to walk in hospital room?: A Little Help needed climbing 3-5 steps with a railing? : A Little 6 Click Score: 20    End of Session   Activity Tolerance: Patient tolerated treatment well;Patient limited by fatigue Patient left: in chair;with call bell/phone within reach Nurse Communication: Mobility status PT Visit Diagnosis: Unsteadiness on feet (R26.81);Other abnormalities of gait and mobility (R26.89);Muscle weakness (generalized) (M62.81)    Time: 8101-7510 PT Time Calculation (min) (ACUTE ONLY): 21 min   Charges:   PT Evaluation $PT Eval Moderate Complexity: 1 Mod PT Treatments $Therapeutic Activity: 8-22 mins        11:48 AM, 03/08/22 Lonell Grandchild, MPT Physical Therapist with [facility]  [place] office [phone] mobile phone

## 2022-03-08 NOTE — [place] FL2 (Signed)
[place] MEDICAID [id number] LEVEL OF CARE FORM     IDENTIFICATION  Patient Name: [name] Birthdate: 1950/04/05 Sex: female Admission Date (Current Location): 02/28/2022  [facility] and [place] Number:  [employer] and Address:  [facility],  [address], [place] [address]      Provider Number: [id number]  Attending Physician Name and Address:  Erick Blinks, DO  Relative Name and Phone Number:  lane,anthony Shari Heritage) [phone]    Current Level of Care: Hospital Recommended Level of Care: Family Care Home Prior Approval Number:    Date Approved/Denied:   PASRR Number:    Discharge Plan: Domiciliary (Rest home) [facility])    Current Diagnoses: Patient Active Problem List   Diagnosis Date Noted   RSV (acute bronchiolitis due to respiratory syncytial virus) 02/28/2022   Protein-calorie malnutrition, severe 08/03/2020   COPD with exacerbation (HCC) 08/02/2020   Acute respiratory failure with hypoxia (HCC) 08/01/2020   Severe recurrent major depression with psychotic features (HCC) 07/03/2020   Hallucinations    Shortness of breath 04/20/2020   Chronic diastolic CHF (congestive heart failure) (HCC) 04/20/2020   Nausea & vomiting 04/20/2020   Prolonged QT interval 04/20/2020   COPD with acute exacerbation (HCC) 06/20/2017   DOE (dyspnea on exertion) 06/19/2017   Cervical dystonia 11/27/2016   C. difficile colitis 09/10/2016   Abdominal pain 09/08/2016   Acute encephalopathy    Fever    Left hemiparesis (HCC)    Generalized OA    Fibromyalgia    Tobacco abuse    Substance abuse (HCC)    Benign essential HTN    Tachycardia    Chronic pain syndrome    Acute blood loss anemia    Leukocytosis    Septic thrombophlebitis of sagittal sinus    Acute respiratory failure (HCC)    Streptococcus pneumoniae meningitis    Streptococcal bacteremia    Meningitis    History of stroke 04/12/2016   Stroke (cerebrum) (HCC) 04/12/2016   Neck pain  04/04/2016   Chronic abdominal pain 02/19/2016   Essential hypertension 02/19/2016   Bacterial conjunctivitis 02/19/2016   Health care maintenance 02/19/2016   Anxiety 05/08/2006   Cigarette smoker 05/08/2006   ASTHMA, UNSPECIFIED 05/08/2006   GERD (gastroesophageal reflux disease) 05/08/2006   CONSTIPATION 05/08/2006   CONVULSIONS, SEIZURES, NOS 05/08/2006    Orientation RESPIRATION BLADDER Height & Weight     Self, Time, Situation, Place    Continent Weight: 97 lb 14.2 oz (44.4 kg) Height:  5\' 3"  (160 cm)  BEHAVIORAL SYMPTOMS/MOOD NEUROLOGICAL BOWEL NUTRITION STATUS      Continent    AMBULATORY STATUS COMMUNICATION OF NEEDS Skin   Supervision   Normal                       Personal Care Assistance Level of Assistance  Bathing, Dressing, Feeding Bathing Assistance: Independent Feeding assistance: Independent Dressing Assistance: Independent     Functional Limitations Info  Sight, Hearing, Speech Sight Info: Adequate Hearing Info: Adequate Speech Info: Adequate    SPECIAL CARE FACTORS FREQUENCY  PT (By licensed PT)     PT Frequency: 3x/week              Contractures Contractures Info: Not present    Additional Factors Info  Code Status, Allergies Code Status Info: DNR Allergies Info: Tylenol (Acetaminophen), Codeine, Nsaids, Tolmetin, Tramadol           Current Medications (03/08/2022):  This is the current  hospital active medication list Current Facility-Administered Medications  Medication Dose Route Frequency Provider Last Rate Last Admin   acetaminophen (TYLENOL) tablet 650 mg  650 mg Oral Q4H PRN Lewie Chamber, MD   650 mg at 03/08/22 1050   Or   acetaminophen (TYLENOL) suppository 650 mg  650 mg Rectal Q6H PRN Lewie Chamber, MD       acetylcysteine (MUCOMYST) 20 % nebulizer / oral solution 4 mL  4 mL Nebulization TID Sherryll Burger, Pratik D, DO   4 mL at 03/08/22 0804   ARIPiprazole (ABILIFY) tablet 5 mg  5 mg Oral Daily Lewie Chamber, MD   5 mg  at 03/08/22 1031   budesonide (PULMICORT) nebulizer solution 0.5 mg  0.5 mg Nebulization TID Sherryll Burger, Pratik D, DO   0.5 mg at 03/08/22 3086   buprenorphine (SUBUTEX) SL tablet 4 mg  4 mg Sublingual BID PRN Lewie Chamber, MD       carvedilol (COREG) tablet 6.25 mg  6.25 mg Oral BID WC Sherryll Burger, Pratik D, DO   6.25 mg at 03/08/22 0858   donepezil (ARICEPT) tablet 10 mg  10 mg Oral Daily Lewie Chamber, MD   10 mg at 03/08/22 1030   doxepin (SINEQUAN) capsule 50 mg  50 mg Oral Daily Lewie Chamber, MD   50 mg at 03/08/22 1031   enoxaparin (LOVENOX) injection 30 mg  30 mg Subcutaneous Q24H Lewie Chamber, MD   30 mg at 03/07/22 2116   ipratropium-albuterol (DUONEB) 0.5-2.5 (3) MG/3ML nebulizer solution 3 mL  3 mL Nebulization Q4H PRN Lewie Chamber, MD   3 mL at 03/06/22 1443   ipratropium-albuterol (DUONEB) 0.5-2.5 (3) MG/3ML nebulizer solution 3 mL  3 mL Nebulization Q6H Shah, Pratik D, DO   3 mL at 03/08/22 0804   LORazepam (ATIVAN) tablet 0.5 mg  0.5 mg Oral BID PRN Lewie Chamber, MD   0.5 mg at 02/28/22 2344   losartan (COZAAR) tablet 100 mg  100 mg Oral Daily Sherryll Burger, Pratik D, DO   100 mg at 03/08/22 1031   methylPREDNISolone sodium succinate (SOLU-MEDROL) 40 mg/mL injection 40 mg  40 mg Intravenous BID Lewie Chamber, MD   40 mg at 03/08/22 1031   mirtazapine (REMERON) tablet 7.5 mg  7.5 mg Oral QHS Lewie Chamber, MD   7.5 mg at 03/07/22 2115   ofloxacin (OCUFLOX) 0.3 % ophthalmic solution 1 drop  1 drop Right Eye QID Sherryll Burger, Pratik D, DO   1 drop at 03/08/22 1032   OLANZapine (ZYPREXA) tablet 10 mg  10 mg Oral Axel Filler, MD   10 mg at 03/07/22 2115   pantoprazole (PROTONIX) EC tablet 40 mg  40 mg Oral Daily Vassie Loll, MD   40 mg at 03/08/22 1031   piperacillin-tazobactam (ZOSYN) IVPB 3.375 g  3.375 g Intravenous Q8H Shah, Pratik D, DO 12.5 mL/hr at 03/08/22 1040 3.375 g at 03/08/22 1040   polyethylene glycol (MIRALAX / GLYCOLAX) packet 17 g  17 g Oral Daily PRN Lewie Chamber, MD        sodium chloride flush (NS) 0.9 % injection 3 mL  3 mL Intravenous Orlene Erm, MD   3 mL at 03/08/22 1032   temazepam (RESTORIL) capsule 22.5 mg  22.5 mg Oral Axel Filler, MD   22.5 mg at 03/07/22 2115     Discharge Medications: TAKE these medications     acetaminophen 500 MG tablet Commonly known as: TYLENOL Take 2 tablets (1,000 mg total) by mouth every 6 (six) hours  as needed for mild pain.    albuterol 108 (90 Base) MCG/ACT inhaler Commonly known as: VENTOLIN HFA Inhale 1-2 puffs into the lungs every 6 (six) hours as needed for wheezing or shortness of breath.    amoxicillin-clavulanate 500-125 MG tablet Commonly known as: Augmentin Take 1 tablet by mouth 3 (three) times daily for 3 days.    ARIPiprazole 5 MG tablet Commonly known as: ABILIFY Take 5 mg by mouth daily.    buprenorphine 2 MG Subl SL tablet Commonly known as: SUBUTEX Place 2 tablets (4 mg total) under the tongue every 6 (six) hours as needed (pain).    carvedilol 6.25 MG tablet Commonly known as: COREG Take 1 tablet (6.25 mg total) by mouth 2 (two) times daily with a meal.    DHEA 10 MG Caps Take 1 capsule by mouth daily.    donepezil 10 MG tablet Commonly known as: ARICEPT Take 10 mg by mouth daily.    doxepin 50 MG capsule Commonly known as: SINEQUAN Take 1 capsule (50 mg total) by mouth daily.    Ensure Original Liqd Take 1 Can by mouth in the morning, at noon, in the evening, and at bedtime.    GoodSense Ibuprofen 200 MG tablet Generic drug: ibuprofen Take 200 mg by mouth daily.    LORazepam 0.5 MG tablet Commonly known as: ATIVAN Take 1 tablet (0.5 mg total) by mouth 2 (two) times daily as needed for anxiety.    losartan 100 MG tablet Commonly known as: COZAAR Take 100 mg by mouth daily.    mirtazapine 7.5 MG tablet Commonly known as: REMERON Take 7.5 mg by mouth at bedtime.    naloxone 4 MG/0.1ML Liqd nasal spray kit Commonly known as: NARCAN Place 1 spray into the  nose once.    ofloxacin 0.3 % ophthalmic solution Commonly known as: OCUFLOX Place 1 drop into the right eye 4 (four) times daily for 3 days.    OLANZapine 10 MG tablet Commonly known as: ZyPREXA Take 1 tablet (10 mg total) by mouth at bedtime.    predniSONE 10 MG tablet Commonly known as: DELTASONE Take 4 tablets (40 mg total) by mouth daily for 5 days.    strong iodine 5 % solution Take 1 mL by mouth 3 (three) times a week.    temazepam 22.5 MG capsule Commonly known as: RESTORIL Take 1 capsule (22.5 mg total) by mouth at bedtime.    traMADol 50 MG tablet Commonly known as: ULTRAM Take 1 tablet (50 mg total) by mouth every 4 (four) hours as needed for moderate pain.    Trelegy Ellipta 100-62.5-25 MCG/ACT Aepb Generic drug: Fluticasone-Umeclidin-Vilant Inhale 1 puff into the lungs daily.    VITAMIN A PO Take 1 capsule by mouth 3 (three) times a week.    Zinc Sulfate 220 (50 Zn) MG Tabs Take 0.5 tablets by mouth 3 (three) times a week.     Relevant Imaging Results:  Relevant Lab Results:   Additional Information SSN [id number]  Leianne Callins, Juleen China, LCSW

## 2022-03-08 NOTE — Plan of Care (Signed)
  Problem: Acute Rehab PT Goals(only PT should resolve) Goal: Pt Will Go Supine/Side To Sit Outcome: Progressing Flowsheets (Taken 03/08/2022 1150) Pt will go Supine/Side to Sit:  with modified independence  Independently Goal: Patient Will Transfer Sit To/From Stand Outcome: Progressing Flowsheets (Taken 03/08/2022 1150) Patient will transfer sit to/from stand:  with supervision  with modified independence Goal: Pt Will Transfer Bed To Chair/Chair To Bed Outcome: Progressing Flowsheets (Taken 03/08/2022 1150) Pt will Transfer Bed to Chair/Chair to Bed:  with modified independence  with supervision Goal: Pt Will Ambulate Outcome: Progressing Flowsheets (Taken 03/08/2022 1150) Pt will Ambulate:  50 feet  with supervision  with modified independence  with rolling walker   11:51 AM, 03/08/22 Lonell Grandchild, MPT Physical Therapist with [facility] [phone] office [phone] mobile phone

## 2022-03-08 NOTE — Progress Notes (Signed)
[facility] [phone] called voice mail box full and not able to leave message.. called son [name] lane at [phone]   he provided casey director of home [phone] to arrange pick up  he also asked that any new medications be [facility] [place] [phone] for any new RX'S

## 2022-03-08 NOTE — Progress Notes (Signed)
[phone] [facility] staff on call and will come pick  pt up please call when pt ready staff will have other consumers with her and can not come in

## 2022-03-08 NOTE — Discharge Summary (Signed)
Physician Discharge Summary  [name] ZCH:[id number] DOB: 1951/02/29 DOA: 02/28/2022  PCP: [name], MD  Admit date: 02/28/2022  Discharge date: 03/08/2022  Admitted From:ALF  Disposition:  ALF  Recommendations for Outpatient Follow-up:  Follow up with PCP in 1-2 weeks Continue on prednisone as prescribed for 5 more days Continue on Augmentin for 3 more days Continue on ofloxacin drops as prescribed for 3 more days due to right eye bacterial conjunctivitis Continue multiple other home medications as prior with prescriptions provided and printed  Home Health: Yes with PT  Equipment/Devices: None  Discharge Condition:Stable  CODE STATUS: DNR  Diet recommendation: Heart Healthy  Brief/Interim Summary: [name] is a 71 yo female with PMH COPD, arthritis, diverticulosis, GERD, HTN, CVA who presented with worsening shortness of breath, ongoing hypoxia, and generalized malaise. She was previously seen in the ER on 02/22/2022 with cough and shortness of breath as well.  She tested positive for RSV at that time.  She was treated with breathing treatments and prescribed doxycycline and prednisone along with oxygen. Due to not feeling better over the past few days, she presented back. Testing is again positive for RSV.  She remains hypoxic requiring 2 L.  Initial chest x-ray did not demonstrate any obvious infiltrates or acute abnormalities, however she did develop worsening hypoxemia in the setting of aspiration pneumonia with right-sided infiltrate and was started on IV Zosyn 12/26.  She gradually improved in her overall condition and was weaned off of oxygen altogether and was seen by physical therapy with need for home health physical therapy which will be arranged.  During the course of her stay she also had some bacterial conjunctivitis to her right eye and was started on ofloxacin drops with improvement.  She is now in stable condition for discharge today and has  medications prescribed as noted above.  Discharge Diagnoses:  Principal Problem:   RSV (acute bronchiolitis due to respiratory syncytial virus) Active Problems:   Acute respiratory failure with hypoxia (HCC)   Anxiety   GERD (gastroesophageal reflux disease)   Essential hypertension   Fibromyalgia   Chronic pain syndrome  Principal discharge diagnosis: Acute hypoxemic respiratory failure-multifactorial with RSV infection as well as aspiration pneumonia.  Discharge Instructions  Discharge Instructions     Diet - low sodium heart healthy   Complete by: As directed    Increase activity slowly   Complete by: As directed       Allergies as of 03/08/2022       Reactions   Tylenol [acetaminophen] Other (See Comments)   Inflammed Liver   Codeine Nausea And Vomiting   Nsaids Other (See Comments)   Stomach ulcers   Tolmetin Other (See Comments)   Stomach ulcers   Tramadol Other (See Comments)   GI UPSET        Medication List     TAKE these medications    acetaminophen 500 MG tablet Commonly known as: TYLENOL Take 2 tablets (1,000 mg total) by mouth every 6 (six) hours as needed for mild pain.   albuterol 108 (90 Base) MCG/ACT inhaler Commonly known as: VENTOLIN HFA Inhale 1-2 puffs into the lungs every 6 (six) hours as needed for wheezing or shortness of breath.   amoxicillin-clavulanate 500-125 MG tablet Commonly known as: Augmentin Take 1 tablet by mouth 3 (three) times daily for 3 days.   ARIPiprazole 5 MG tablet Commonly known as: ABILIFY Take 5 mg by mouth daily.   buprenorphine 2 MG Subl SL  tablet Commonly known as: SUBUTEX Place 2 tablets (4 mg total) under the tongue every 6 (six) hours as needed (pain).   carvedilol 6.25 MG tablet Commonly known as: COREG Take 1 tablet (6.25 mg total) by mouth 2 (two) times daily with a meal.   DHEA 10 MG Caps Take 1 capsule by mouth daily.   donepezil 10 MG tablet Commonly known as: ARICEPT Take 10 mg by  mouth daily.   doxepin 50 MG capsule Commonly known as: SINEQUAN Take 1 capsule (50 mg total) by mouth daily.   Ensure Original Liqd Take 1 Can by mouth in the morning, at noon, in the evening, and at bedtime.   GoodSense Ibuprofen 200 MG tablet Generic drug: ibuprofen Take 200 mg by mouth daily.   LORazepam 0.5 MG tablet Commonly known as: ATIVAN Take 1 tablet (0.5 mg total) by mouth 2 (two) times daily as needed for anxiety.   losartan 100 MG tablet Commonly known as: COZAAR Take 100 mg by mouth daily.   mirtazapine 7.5 MG tablet Commonly known as: REMERON Take 7.5 mg by mouth at bedtime.   naloxone 4 MG/0.1ML Liqd nasal spray kit Commonly known as: NARCAN Place 1 spray into the nose once.   ofloxacin 0.3 % ophthalmic solution Commonly known as: OCUFLOX Place 1 drop into the right eye 4 (four) times daily for 3 days.   OLANZapine 10 MG tablet Commonly known as: ZyPREXA Take 1 tablet (10 mg total) by mouth at bedtime.   predniSONE 10 MG tablet Commonly known as: DELTASONE Take 4 tablets (40 mg total) by mouth daily for 5 days.   strong iodine 5 % solution Take 1 mL by mouth 3 (three) times a week.   temazepam 22.5 MG capsule Commonly known as: RESTORIL Take 1 capsule (22.5 mg total) by mouth at bedtime.   traMADol 50 MG tablet Commonly known as: ULTRAM Take 1 tablet (50 mg total) by mouth every 4 (four) hours as needed for moderate pain.   Trelegy Ellipta 100-62.5-25 MCG/ACT Aepb Generic drug: Fluticasone-Umeclidin-Vilant Inhale 1 puff into the lungs daily.   VITAMIN A PO Take 1 capsule by mouth 3 (three) times a week.   Zinc Sulfate 220 (50 Zn) MG Tabs Take 0.5 tablets by mouth 3 (three) times a week.        Follow-up Information     Connect with your PCP/Specialist as discussed. Schedule an appointment as soon as possible for a visit .   Contact information: [email or web address] Call our physician referral line at  [phone].        [name], MD. Schedule an appointment as soon as possible for a visit in 1 week(s).   Specialty: Family Medicine Contact information: [place] [address] [phone]                Allergies  Allergen Reactions   Tylenol [Acetaminophen] Other (See Comments)    Inflammed Liver   Codeine Nausea And Vomiting   Nsaids Other (See Comments)    Stomach ulcers   Tolmetin Other (See Comments)    Stomach ulcers   Tramadol Other (See Comments)    GI UPSET    Consultations: None   Procedures/Studies: DG CHEST PORT 1 VIEW  Result Date: 03/05/2022 CLINICAL DATA:  Dyspnea. EXAM: PORTABLE CHEST 1 VIEW COMPARISON:  02/28/2022 FINDINGS: The heart size and mediastinal contours are within normal limits. Stable advanced emphysematous lung disease. Mildly increased parenchymal density in the lateral aspect of the  right lung base could represent developing pneumonia. No overt pulmonary edema, pleural fluid or pneumothorax. The visualized skeletal structures are unremarkable. IMPRESSION: Possible developing pneumonia in the lateral aspect of the right lung base. Stable advanced emphysematous lung disease. Electronically Signed   By: Aletta Edouard M.D.   On: 03/05/2022 09:08   DG Chest Portable 1 View  Result Date: 02/28/2022 CLINICAL DATA:  RSV positive.  Cough and short of breath EXAM: PORTABLE CHEST 1 VIEW COMPARISON:  Chest 02/22/2022 FINDINGS: The heart size and mediastinal contours are within normal limits. Both lungs are clear. The visualized skeletal structures are unremarkable. IMPRESSION: No active disease. Electronically Signed   By: Franchot Gallo M.D.   On: 02/28/2022 12:55   DG Chest 2 View  Result Date: 02/22/2022 CLINICAL DATA:  Cough, fever EXAM: CHEST - 2 VIEW COMPARISON:  10/30/2020 FINDINGS: Cardiac size is within normal limits. Increase in AP diameter of chest suggests COPD. There are no signs of pulmonary edema or  focal pulmonary consolidation. Faint nodular densities in both lower lung fields most likely are nipple shadows. There is no pleural effusion or pneumothorax. IMPRESSION: COPD. There are no signs of pulmonary edema or focal pulmonary consolidation. Electronically Signed   By: Elmer Picker M.D.   On: 02/22/2022 18:57     Discharge Exam: Vitals:   03/08/22 0803 03/08/22 0804  BP:    Pulse:    Resp:    Temp:    SpO2: 97% 97%   Vitals:   03/07/22 2115 03/08/22 0312 03/08/22 0803 03/08/22 0804  BP: 130/73 130/65    Pulse: 64 (!) 54    Resp: 17 19    Temp: 98.6 F (37 C) 97.9 F (36.6 C)    TempSrc: Oral Oral    SpO2: 92% 98% 97% 97%  Weight:      Height:        General: Pt is alert, awake, not in acute distress Cardiovascular: RRR, S1/S2 +, no rubs, no gallops Respiratory: CTA bilaterally, no wheezing, no rhonchi Abdominal: Soft, NT, ND, bowel sounds + Extremities: no edema, no cyanosis    The results of significant diagnostics from this hospitalization (including imaging, microbiology, ancillary and laboratory) are listed below for reference.     Microbiology: Recent Results (from the past 240 hour(s))  Resp panel by RT-PCR (RSV, Flu A&B, Covid) Anterior Nasal Swab     Status: Abnormal   Collection Time: 02/28/22 12:35 PM   Specimen: Anterior Nasal Swab  Result Value Ref Range Status   SARS Coronavirus 2 by RT PCR NEGATIVE NEGATIVE Final    Comment: (NOTE) SARS-CoV-2 target nucleic acids are NOT DETECTED.  The SARS-CoV-2 RNA is generally detectable in upper respiratory specimens during the acute phase of infection. The lowest concentration of SARS-CoV-2 viral copies this assay can detect is 138 copies/mL. A negative result does not preclude SARS-Cov-2 infection and should not be used as the sole basis for treatment or other patient management decisions. A negative result may occur with  improper specimen collection/handling, submission of specimen other than  nasopharyngeal swab, presence of viral mutation(s) within the areas targeted by this assay, and inadequate number of viral copies(<138 copies/mL). A negative result must be combined with clinical observations, patient history, and epidemiological information. The expected result is Negative.  Fact Sheet for Patients:  EntrepreneurPulse.com.au  Fact Sheet for Healthcare Providers:  IncredibleEmployment.be  This test is no t yet approved or cleared by the [place] and  has been authorized for  detection and/or diagnosis of SARS-CoV-2 by FDA under an Emergency Use Authorization (EUA). This EUA will remain  in effect (meaning this test can be used) for the duration of the COVID-19 declaration under Section 564(b)(1) of the Act, 21 U.S.C.section 360bbb-3(b)(1), unless the authorization is terminated  or revoked sooner.       Influenza A by PCR NEGATIVE NEGATIVE Final   Influenza B by PCR NEGATIVE NEGATIVE Final    Comment: (NOTE) The Xpert Xpress SARS-CoV-2/FLU/RSV plus assay is intended as an aid in the diagnosis of influenza from Nasopharyngeal swab specimens and should not be used as a sole basis for treatment. Nasal washings and aspirates are unacceptable for Xpert Xpress SARS-CoV-2/FLU/RSV testing.  Fact Sheet for Patients: EntrepreneurPulse.com.au  Fact Sheet for Healthcare Providers: IncredibleEmployment.be  This test is not yet approved or cleared by the [place] FDA and has been authorized for detection and/or diagnosis of SARS-CoV-2 by FDA under an Emergency Use Authorization (EUA). This EUA will remain in effect (meaning this test can be used) for the duration of the COVID-19 declaration under Section 564(b)(1) of the Act, 21 U.S.C. section 360bbb-3(b)(1), unless the authorization is terminated or revoked.     Resp Syncytial Virus by PCR POSITIVE (A) NEGATIVE Final    Comment:  (NOTE) Fact Sheet for Patients: EntrepreneurPulse.com.au  Fact Sheet for Healthcare Providers: IncredibleEmployment.be  This test is not yet approved or cleared by the [place] FDA and has been authorized for detection and/or diagnosis of SARS-CoV-2 by FDA under an Emergency Use Authorization (EUA). This EUA will remain in effect (meaning this test can be used) for the duration of the COVID-19 declaration under Section 564(b)(1) of the Act, 21 U.S.C. section 360bbb-3(b)(1), unless the authorization is terminated or revoked.  Performed at [facility], [address], [place], [place] [address]   Blood culture (routine x 2)     Status: None   Collection Time: 02/28/22  2:04 PM   Specimen: BLOOD  Result Value Ref Range Status   Specimen Description BLOOD BLOOD RIGHT ARM  Final   Special Requests   Final    BOTTLES DRAWN AEROBIC AND ANAEROBIC Blood Culture adequate volume BLOOD RIGHT ARM   Culture   Final    NO GROWTH 5 DAYS Performed at [facility], [address], [place], [place] [address]    Report Status 03/05/2022 FINAL  Final  Blood culture (routine x 2)     Status: None   Collection Time: 02/28/22  2:04 PM   Specimen: BLOOD  Result Value Ref Range Status   Specimen Description BLOOD BLOOD LEFT ARM  Final   Special Requests   Final    BOTTLES DRAWN AEROBIC AND ANAEROBIC Blood Culture adequate volume BLOOD LEFT ARM   Culture   Final    NO GROWTH 5 DAYS Performed at [facility], [address] [place], [place] [address]    Report Status 03/05/2022 FINAL  Final     Labs: BNP (last 3 results) Recent Labs    03/05/22 0550  BNP 69.6   Basic Metabolic Panel: Recent Labs  Lab 03/02/22 0452 03/03/22 0545 03/06/22 0415 03/07/22 0402 03/08/22 0513  NA 143 143 143 143 142  K 4.1 4.0 3.8 3.8 3.6  CL 104 104 103 103 104  CO2 33* 32 33* 33* 31  GLUCOSE 147* 144* 146* 122* 125*  BUN 32* 31* 27* 29* 26*  CREATININE 0.61 0.51  0.55 0.54 0.41*  CALCIUM 9.2 9.1 8.9 8.9 8.6*  MG  --  1.9 1.9 1.9 2.1   Liver Function Tests: No results for input(s): "AST", "ALT", "ALKPHOS", "BILITOT", "PROT", "ALBUMIN" in the last 168 hours. No results for input(s): "LIPASE", "AMYLASE" in the last 168 hours. No results for input(s): "AMMONIA" in the last 168 hours. CBC: Recent Labs  Lab 03/04/22 0514 03/05/22 0450 03/06/22 0415 03/07/22 0402 03/08/22 0513  WBC 15.5* 15.1* 20.6* 17.6* 15.8*  HGB 14.0 14.3 13.9 13.7 13.5  HCT 42.8 43.8 43.7 42.8 41.6  MCV 92.4 93.0 92.8 92.0 91.0  PLT 296 333 355 390 383   Cardiac Enzymes: No results for input(s): "CKTOTAL", "CKMB", "CKMBINDEX", "TROPONINI" in the last 168 hours. BNP: Invalid input(s): "POCBNP" CBG: No results for input(s): "GLUCAP" in the last 168 hours. D-Dimer No results for input(s): "DDIMER" in the last 72 hours. Hgb A1c No results for input(s): "HGBA1C" in the last 72 hours. Lipid Profile No results for input(s): "CHOL", "HDL", "LDLCALC", "TRIG", "CHOLHDL", "LDLDIRECT" in the last 72 hours. Thyroid function studies No results for input(s): "TSH", "T4TOTAL", "T3FREE", "THYROIDAB" in the last 72 hours.  Invalid input(s): "FREET3" Anemia work up No results for input(s): "VITAMINB12", "FOLATE", "FERRITIN", "TIBC", "IRON", "RETICCTPCT" in the last 72 hours. Urinalysis    Component Value Date/Time   COLORURINE YELLOW 12/03/2020 1545   APPEARANCEUR CLOUDY (A) 12/03/2020 1545   LABSPEC 1.012 12/03/2020 1545   PHURINE 8.0 12/03/2020 1545   GLUCOSEU 50 (A) 12/03/2020 1545   HGBUR NEGATIVE 12/03/2020 1545   BILIRUBINUR NEGATIVE 12/03/2020 1545   KETONESUR NEGATIVE 12/03/2020 1545   PROTEINUR NEGATIVE 12/03/2020 1545   UROBILINOGEN 0.2 05/26/2013 1730   NITRITE NEGATIVE 12/03/2020 1545   LEUKOCYTESUR NEGATIVE 12/03/2020 1545   Sepsis Labs Recent Labs  Lab 03/05/22 0450 03/06/22 0415 03/07/22 0402 03/08/22 0513  WBC 15.1* 20.6* 17.6* 15.8*    Microbiology Recent Results (from the past 240 hour(s))  Resp panel by RT-PCR (RSV, Flu A&B, Covid) Anterior Nasal Swab     Status: Abnormal   Collection Time: 02/28/22 12:35 PM   Specimen: Anterior Nasal Swab  Result Value Ref Range Status   SARS Coronavirus 2 by RT PCR NEGATIVE NEGATIVE Final    Comment: (NOTE) SARS-CoV-2 target nucleic acids are NOT DETECTED.  The SARS-CoV-2 RNA is generally detectable in upper respiratory specimens during the acute phase of infection. The lowest concentration of SARS-CoV-2 viral copies this assay can detect is 138 copies/mL. A negative result does not preclude SARS-Cov-2 infection and should not be used as the sole basis for treatment or other patient management decisions. A negative result may occur with  improper specimen collection/handling, submission of specimen other than nasopharyngeal swab, presence of viral mutation(s) within the areas targeted by this assay, and inadequate number of viral copies(<138 copies/mL). A negative result must be combined with clinical observations, patient history, and epidemiological information. The expected result is Negative.  Fact Sheet for Patients:  EntrepreneurPulse.com.au  Fact Sheet for Healthcare Providers:  IncredibleEmployment.be  This test is no t yet approved or cleared by the [place] FDA and  has been authorized for detection and/or diagnosis of SARS-CoV-2 by FDA under an Emergency Use Authorization (EUA). This EUA will remain  in effect (meaning this test can be used) for the duration of the COVID-19 declaration under Section 564(b)(1) of the Act, 21 U.S.C.section 360bbb-3(b)(1), unless the authorization is terminated  or revoked sooner.       Influenza A by PCR NEGATIVE NEGATIVE Final   Influenza B by PCR NEGATIVE NEGATIVE Final  Comment: (NOTE) The Xpert Xpress SARS-CoV-2/FLU/RSV plus assay is intended as an aid in the diagnosis of  influenza from Nasopharyngeal swab specimens and should not be used as a sole basis for treatment. Nasal washings and aspirates are unacceptable for Xpert Xpress SARS-CoV-2/FLU/RSV testing.  Fact Sheet for Patients: EntrepreneurPulse.com.au  Fact Sheet for Healthcare Providers: IncredibleEmployment.be  This test is not yet approved or cleared by the [place] FDA and has been authorized for detection and/or diagnosis of SARS-CoV-2 by FDA under an Emergency Use Authorization (EUA). This EUA will remain in effect (meaning this test can be used) for the duration of the COVID-19 declaration under Section 564(b)(1) of the Act, 21 U.S.C. section 360bbb-3(b)(1), unless the authorization is terminated or revoked.     Resp Syncytial Virus by PCR POSITIVE (A) NEGATIVE Final    Comment: (NOTE) Fact Sheet for Patients: EntrepreneurPulse.com.au  Fact Sheet for Healthcare Providers: IncredibleEmployment.be  This test is not yet approved or cleared by the [place] FDA and has been authorized for detection and/or diagnosis of SARS-CoV-2 by FDA under an Emergency Use Authorization (EUA). This EUA will remain in effect (meaning this test can be used) for the duration of the COVID-19 declaration under Section 564(b)(1) of the Act, 21 U.S.C. section 360bbb-3(b)(1), unless the authorization is terminated or revoked.  Performed at [facility], [address] [place], [place] [address]   Blood culture (routine x 2)     Status: None   Collection Time: 02/28/22  2:04 PM   Specimen: BLOOD  Result Value Ref Range Status   Specimen Description BLOOD BLOOD RIGHT ARM  Final   Special Requests   Final    BOTTLES DRAWN AEROBIC AND ANAEROBIC Blood Culture adequate volume BLOOD RIGHT ARM   Culture   Final    NO GROWTH 5 DAYS Performed at [facility], [address] [place], [place] [address]    Report Status 03/05/2022  FINAL  Final  Blood culture (routine x 2)     Status: None   Collection Time: 02/28/22  2:04 PM   Specimen: BLOOD  Result Value Ref Range Status   Specimen Description BLOOD BLOOD LEFT ARM  Final   Special Requests   Final    BOTTLES DRAWN AEROBIC AND ANAEROBIC Blood Culture adequate volume BLOOD LEFT ARM   Culture   Final    NO GROWTH 5 DAYS Performed at [facility], [address], [place], [place] [address]    Report Status 03/05/2022 FINAL  Final     Time coordinating discharge: 35 minutes  SIGNED:   Rodena Goldmann, DO [facility] 03/08/2022, 12:38 PM  If 7PM-7AM, please contact night-coverage [email or web address]

## 2022-03-08 NOTE — Progress Notes (Signed)
Patient slept on and off this shift only getting up to use the restroom. Continued to monitor the patient.

## 2022-03-08 NOTE — TOC Transition Note (Signed)
Transition of Care [facility]) - CM/SW Discharge Note   Patient Details  Name: [name] MRN: [id number] Date of Birth: [birth year]  [facility]) CM/SW Contact:  Ihor Gully, LCSW Phone Number: 03/08/2022, 1:20 PM   Clinical Narrative:    D/c clinicals added to patient's d/c paperwork. Nurse notified son and facility will transport. [facility] to provide HHPT.    Final next level of care: Home/Self Care Barriers to Discharge: No Barriers Identified   Patient Goals and CMS Choice CMS Medicare.gov Compare Post Acute Care list provided to:: Patient Choice offered to / list presented to : Patient  Discharge Placement                         Discharge Plan and Services Additional resources added to the After Visit Summary for   In-house Referral: Clinical Social Work Discharge Planning Services: CM Consult                      HH Arranged: PT [place] Agency: [place] ([place]) Date [place]: 03/08/22 Time [place]: [place] Representative spoke with at [place]: [place] Determinants of Health ([place]) Interventions [place]: No Food Insecurity (02/28/2022)  Housing: Low Risk  (02/28/2022)  Transportation Needs: No Transportation Needs (02/28/2022)  Utilities: Not At Risk (02/28/2022)  Tobacco Use: High Risk (02/28/2022)     Readmission Risk Interventions    03/01/2022   11:53 AM  Readmission Risk Prevention Plan  Transportation Screening Complete  Home Care Screening Complete  Medication Review (RN CM) Complete

## 2022-03-12 DIAGNOSIS — M797 Fibromyalgia: Secondary | ICD-10-CM | POA: Diagnosis not present

## 2022-03-12 DIAGNOSIS — H109 Unspecified conjunctivitis: Secondary | ICD-10-CM | POA: Diagnosis not present

## 2022-03-12 DIAGNOSIS — Z79891 Long term (current) use of opiate analgesic: Secondary | ICD-10-CM | POA: Diagnosis not present

## 2022-03-12 DIAGNOSIS — Z8673 Personal history of transient ischemic attack (TIA), and cerebral infarction without residual deficits: Secondary | ICD-10-CM | POA: Diagnosis not present

## 2022-03-12 DIAGNOSIS — G894 Chronic pain syndrome: Secondary | ICD-10-CM | POA: Diagnosis not present

## 2022-03-12 DIAGNOSIS — Z79899 Other long term (current) drug therapy: Secondary | ICD-10-CM | POA: Diagnosis not present

## 2022-03-12 DIAGNOSIS — D62 Acute posthemorrhagic anemia: Secondary | ICD-10-CM | POA: Diagnosis not present

## 2022-03-12 DIAGNOSIS — J440 Chronic obstructive pulmonary disease with acute lower respiratory infection: Secondary | ICD-10-CM | POA: Diagnosis not present

## 2022-03-12 DIAGNOSIS — J9601 Acute respiratory failure with hypoxia: Secondary | ICD-10-CM | POA: Diagnosis not present

## 2022-03-12 DIAGNOSIS — K5790 Diverticulosis of intestine, part unspecified, without perforation or abscess without bleeding: Secondary | ICD-10-CM | POA: Diagnosis not present

## 2022-03-12 DIAGNOSIS — K219 Gastro-esophageal reflux disease without esophagitis: Secondary | ICD-10-CM | POA: Diagnosis not present

## 2022-03-12 DIAGNOSIS — J690 Pneumonitis due to inhalation of food and vomit: Secondary | ICD-10-CM | POA: Diagnosis not present

## 2022-03-12 DIAGNOSIS — I499 Cardiac arrhythmia, unspecified: Secondary | ICD-10-CM | POA: Diagnosis not present

## 2022-03-12 DIAGNOSIS — M1990 Unspecified osteoarthritis, unspecified site: Secondary | ICD-10-CM | POA: Diagnosis not present

## 2022-03-12 DIAGNOSIS — F17210 Nicotine dependence, cigarettes, uncomplicated: Secondary | ICD-10-CM | POA: Diagnosis not present

## 2022-03-12 DIAGNOSIS — Z791 Long term (current) use of non-steroidal anti-inflammatories (NSAID): Secondary | ICD-10-CM | POA: Diagnosis not present

## 2022-03-12 DIAGNOSIS — I10 Essential (primary) hypertension: Secondary | ICD-10-CM | POA: Diagnosis not present

## 2022-03-12 DIAGNOSIS — Z7951 Long term (current) use of inhaled steroids: Secondary | ICD-10-CM | POA: Diagnosis not present

## 2022-03-13 ENCOUNTER — Encounter (HOSPITAL_BASED_OUTPATIENT_CLINIC_OR_DEPARTMENT_OTHER): Payer: 59 | Attending: Physician Assistant | Admitting: Physician Assistant

## 2022-03-13 DIAGNOSIS — L89154 Pressure ulcer of sacral region, stage 4: Secondary | ICD-10-CM | POA: Insufficient documentation

## 2022-03-13 DIAGNOSIS — L89153 Pressure ulcer of sacral region, stage 3: Secondary | ICD-10-CM | POA: Insufficient documentation

## 2022-03-13 DIAGNOSIS — E43 Unspecified severe protein-calorie malnutrition: Secondary | ICD-10-CM | POA: Insufficient documentation

## 2022-03-14 NOTE — Progress Notes (Signed)
[name] ([id number]) [id number] Page 1 of 7 Visit Report for 02/13/2022 Arrival Information Details Patient Name: Date of Service: [name]. 02/13/2022 11:00 A M Medical Record Number: [id number] Patient Account Number: [id number] Date of Birth/Sex: Treating RN: 01-Aug-1950 (72 y.o. F) Primary Care Mataio Mele: [name], [name] Other Clinician: Referring Richelle Glick: Treating Earlisha Sharples/Extender: [name] in Treatment: 6 Visit Information History Since Last Visit Added or deleted any medications: No Patient Arrived: Ambulatory Any new allergies or adverse reactions: No Arrival Time: 11:03 Had a fall or experienced change in No Accompanied By: self activities of daily living that may affect Transfer Assistance: None risk of falls: Patient Identification Verified: Yes Signs or symptoms of abuse/neglect since last visito No Secondary Verification Process Completed: Yes Hospitalized since last visit: No Patient Requires Transmission-Based Precautions: No Implantable device outside of the clinic excluding No Patient Has Alerts: No cellular tissue based products placed in the center since last visit: Has Dressing in Place as Prescribed: Yes Pain Present Now: No Electronic Signature(s) Signed: 02/13/2022 4:44:51 PM By: Erenest Blank Entered By: Erenest Blank on 02/13/2022 11:04:06 -------------------------------------------------------------------------------- Clinic Level of Care Assessment Details Patient Name: Date of Service: [name] 02/13/2022 11:00 A M Medical Record Number: [id number] Patient Account Number: [id number] Date of Birth/Sex: Treating RN: [birth year] (72 y.o. [name], [name] Primary Care Kresha Abelson: [name], [name] Other Clinician: Referring Merriam Brandner: Treating Janelli Welling/Extender: [name] in Treatment: 6 Clinic Level of Care Assessment  Items TOOL 4 Quantity Score X- 1 0 Use when only an EandM is performed on FOLLOW-UP visit ASSESSMENTS - Nursing Assessment / Reassessment X- 1 10 Reassessment of Co-morbidities (includes updates in patient status) X- 1 5 Reassessment of Adherence to Treatment Plan ASSESSMENTS - Wound and Skin A ssessment / Reassessment X - Simple Wound Assessment / Reassessment - one wound 1 5 '[]'$  - 0 Complex Wound Assessment / Reassessment - multiple wounds '[]'$  - 0 Dermatologic / Skin Assessment (not related to wound area) ASSESSMENTS - Focused Assessment '[]'$  - 0 Circumferential Edema Measurements - multi extremities '[]'$  - 0 Nutritional Assessment / Counseling / Intervention [name] ([id number]) [phone].pdf Page 2 of 7 '[]'$  - 0 Lower Extremity Assessment (monofilament, tuning fork, pulses) '[]'$  - 0 Peripheral Arterial Disease Assessment (using hand held doppler) ASSESSMENTS - Ostomy and/or Continence Assessment and Care '[]'$  - 0 Incontinence Assessment and Management '[]'$  - 0 Ostomy Care Assessment and Management (repouching, etc.) PROCESS - Coordination of Care X - Simple Patient / Family Education for ongoing care 1 15 '[]'$  - 0 Complex (extensive) Patient / Family Education for ongoing care X- 1 10 Staff obtains Programmer, systems, Records, T Results / Process Orders est '[]'$  - 0 Staff telephones HHA, Nursing Homes / Clarify orders / etc '[]'$  - 0 Routine Transfer to another Facility (non-emergent condition) '[]'$  - 0 Routine Hospital Admission (non-emergent condition) '[]'$  - 0 New Admissions / Biomedical engineer / Ordering NPWT Apligraf, etc. , '[]'$  - 0 Emergency Hospital Admission (emergent condition) X- 1 10 Simple Discharge Coordination '[]'$  - 0 Complex (extensive) Discharge Coordination PROCESS - Special Needs '[]'$  - 0 Pediatric / Minor Patient Management '[]'$  - 0 Isolation Patient Management '[]'$  - 0 Hearing / Language / Visual special needs '[]'$  - 0 Assessment of  Community assistance (transportation, D/C planning, etc.) '[]'$  - 0 Additional assistance / Altered mentation '[]'$  - 0 Support Surface(s) Assessment (bed, cushion, seat, etc.) INTERVENTIONS - Wound Cleansing /  Measurement X - Simple Wound Cleansing - one wound 1 5 '[]'$  - 0 Complex Wound Cleansing - multiple wounds X- 1 5 Wound Imaging (photographs - any number of wounds) '[]'$  - 0 Wound Tracing (instead of photographs) X- 1 5 Simple Wound Measurement - one wound '[]'$  - 0 Complex Wound Measurement - multiple wounds INTERVENTIONS - Wound Dressings X - Small Wound Dressing one or multiple wounds 1 10 '[]'$  - 0 Medium Wound Dressing one or multiple wounds '[]'$  - 0 Large Wound Dressing one or multiple wounds X- 1 5 Application of Medications - topical '[]'$  - 0 Application of Medications - injection INTERVENTIONS - Miscellaneous '[]'$  - 0 External ear exam '[]'$  - 0 Specimen Collection (cultures, biopsies, blood, body fluids, etc.) '[]'$  - 0 Specimen(s) / Culture(s) sent or taken to Lab for analysis '[]'$  - 0 Patient Transfer (multiple staff / Civil Service fast streamer / Similar devices) '[]'$  - 0 Simple Staple / Suture removal (25 or less) '[]'$  - 0 Complex Staple / Suture removal (26 or more) '[]'$  - 0 Hypo / Hyperglycemic Management (close monitor of Blood Glucose) [name] ([id number]) [id number] Page 3 of 7 '[]'$  - 0 Ankle / Brachial Index (ABI) - do not check if billed separately X- 1 5 Vital Signs Has the patient been seen at the hospital within the last three years: Yes Total Score: 90 Level Of Care: New/Established - Level 3 Electronic Signature(s) Signed: 03/13/2022 5:35:40 PM By: [name] Entered By: [name] on 02/13/2022 11:23:46 -------------------------------------------------------------------------------- Encounter Discharge Information Details Patient Name: Date of Service: Marissa Perking Cardenas. 02/13/2022 11:00 A M Medical Record Number:  [id number] Patient Account Number: [id number] Date of Birth/Sex: Treating RN: June 07, 1950 (72 y.o. [name], [name] Primary Care Markan Cazarez: [name], [name] Other Clinician: Referring Anaisha Mago: Treating Susie Ehresman/Extender: [name] in Treatment: 6 Encounter Discharge Information Items Discharge Condition: Stable Ambulatory Status: Ambulatory Discharge Destination: Home Transportation: Private Auto Accompanied By: self Schedule Follow-up Appointment: Yes Clinical Summary of Care: Patient Declined Electronic Signature(s) Signed: 03/13/2022 5:35:40 PM By: [name] Entered By: [name] on 02/13/2022 11:24:16 -------------------------------------------------------------------------------- Lower Extremity Assessment Details Patient Name: Date of Service: [name] 02/13/2022 11:00 A M Medical Record Number: [id number] Patient Account Number: [id number] Date of Birth/Sex: Treating RN: 09/03/1950 (72 y.o. F) Primary Care Karmel Patricelli: [name], [name] Other Clinician: Referring Yanixan Mellinger: Treating Aneesha Holloran/Extender: [name] in Treatment: 6 Electronic Signature(s) Signed: 02/13/2022 4:44:51 PM By: Erenest Blank Entered By: Erenest Blank on 02/13/2022 11:04:42 -------------------------------------------------------------------------------- Multi-Disciplinary Care Plan Details Patient Name: Date of Service: Marissa Perking Cardenas. 02/13/2022 11:00 A M Medical Record Number: [id number] Patient Account Number: [id number] TASCHA, CASARES ([id number]) [phone].pdf Page 4 of 7 Date of Birth/Sex: Treating RN: October 19, 1950 (71 y.o. [name], [name] Primary Care Madgeline Rayo: Other Clinician: [name], [name] Referring Melvinia Ashby: Treating Jasman Murri/Extender: [name] in Treatment: 6 Active Inactive Pain, Acute or Chronic Nursing Diagnoses: Pain,  acute or chronic: actual or potential Potential alteration in comfort, pain Goals: Patient will verbalize adequate pain control and receive pain control interventions during procedures as needed Date Initiated: 01/02/2022 Target Resolution Date: 03/09/2022 Goal Status: Active Patient/caregiver will verbalize adequate pain control between visits Date Initiated: 01/02/2022 Target Resolution Date: 03/09/2022 Goal Status: Active Interventions: Encourage patient to take pain medications as prescribed Provide education on pain management Reposition patient for comfort Treatment Activities: Administer pain control measures as ordered :  01/02/2022 Notes: Pressure Nursing Diagnoses: Knowledge deficit related to management of pressures ulcers Goals: Patient/caregiver will verbalize risk factors for pressure ulcer development Date Initiated: 01/02/2022 Target Resolution Date: 03/09/2022 Goal Status: Active Interventions: Assess: immobility, friction, shearing, incontinence upon admission and as needed Assess offloading mechanisms upon admission and as needed Provide education on pressure ulcers Treatment Activities: T ordered outside of clinic : 01/02/2022 est Notes: Electronic Signature(s) Signed: 03/13/2022 5:35:40 PM By: [name] Entered By: [name] on 02/13/2022 11:22:27 -------------------------------------------------------------------------------- Pain Assessment Details Patient Name: Date of Service: Marissa Perking Cardenas. 02/13/2022 11:00 A M Medical Record Number: [id number] Patient Account Number: [id number] Date of Birth/Sex: Treating RN: [birth year] (72 y.o. F) Primary Care Kriya Westra: [name], [name] Other Clinician: Referring Kamare Caspers: Treating Deissy Guilbert/Extender: [name] in Treatment: [address] [place], SIEDLE[place]I Cardenas ([id number]) [id number] Page 5 of 7 Location of Pain Severity and  Description of Pain Patient Has Paino No Site Locations Pain Management and Medication Current Pain Management: Electronic Signature(s) Signed: 02/13/2022 4:44:51 PM By: Erenest Blank Entered By: Erenest Blank on 02/13/2022 11:04:36 -------------------------------------------------------------------------------- Patient/Caregiver Education Details Patient Name: Date of Service: [name] 12/6/2023andnbsp11:00 [place] Record Number: [id number] Patient Account Number: [id number] Date of Birth/Gender: Treating RN: 09/21/1950 (72 y.o. [name], [name] Primary Care Physician: [name], [name] Other Clinician: Referring Physician: Treating Physician/Extender: [name] in Treatment: 6 Education Assessment Education Provided To: Patient Education Topics Provided Pain: Methods: Explain/Verbal Responses: Reinforcements needed, State content correctly Pressure: Methods: Explain/Verbal Responses: Reinforcements needed, State content correctly Electronic Signature(s) Signed: 03/13/2022 5:35:40 PM By: [name] Entered By: [name] on 02/13/2022 11:22:45 [name] ([id number]) [id number] Page 6 of 7 -------------------------------------------------------------------------------- Wound Assessment Details Patient Name: Date of Service: [name] 02/13/2022 11:00 A M Medical Record Number: [id number] Patient Account Number: [id number] Date of Birth/Sex: Treating RN: Oct 24, 1950 (72 y.o. F) Primary Care Wolfgang Finigan: [name], [name] Other Clinician: Referring Yoshio Seliga: Treating Yosselyn Tax/Extender: [name] in Treatment: 6 Wound Status Wound Number: 1 Primary Etiology: Pressure Ulcer Wound Location: Sacrum Wound Status: Open Wounding Event: Pressure Injury Comorbid History: Colitis Date Acquired: 10/09/2021 Weeks Of Treatment: 6 Clustered  Wound: No Photos Wound Measurements Length: (cm) 0.4 Width: (cm) 0.2 Depth: (cm) 0.9 Area: (cm) 0.063 Volume: (cm) 0.057 % Reduction in Area: 82.2% % Reduction in Volume: 76.9% Wound Description Classification: Category/Stage IV Exudate Amount: Medium Exudate Type: Serosanguineous Exudate Color: red, brown Wound Bed Granulation Amount: Large (67-100%) Periwound Skin Texture Texture Color No Abnormalities Noted: No No Abnormalities Noted: No Callus: No Atrophie Blanche: No Crepitus: No Cyanosis: No Excoriation: No Ecchymosis: No Induration: No Erythema: No Rash: No Hemosiderin Staining: No Scarring: No Mottled: No Pallor: No Moisture Rubor: No No Abnormalities Noted: No Dry / Scaly: No Maceration: No Electronic Signature(s) Signed: 02/13/2022 4:44:51 PM By: Erenest Blank Entered By: Erenest Blank on 02/13/2022 11:09:37 [name] ([id number]) [id number] Page 7 of 7 -------------------------------------------------------------------------------- Vitals Details Patient Name: Date of Service: [place] [name] 02/13/2022 11:00 A M Medical Record Number: [id number] Patient Account Number: [id number] Date of Birth/Sex: Treating RN: 1950-10-03 (72 y.o. F) Primary Care Adhira Jamil: [name], [name] Other Clinician: Referring Khayman Kirsch: Treating Eadie Repetto/Extender: [name] in Treatment: 6 Vital Signs Time Taken: 11:04 Temperature (F): 98.1 Height (in): 64 Pulse (bpm): 71 Weight (lbs): 97 Respiratory Rate (breaths/min): 16 Body Mass Index (  BMI): 16.6 Blood Pressure (mmHg): 126/82 Reference Range: 80 - 120 mg / dl Electronic Signature(s) Signed: 02/13/2022 4:44:51 PM By: Erenest Blank Entered By: Erenest Blank on 02/13/2022 11:04:29

## 2022-03-19 NOTE — Progress Notes (Signed)
JALA, DUNDON Cardenas ([id number]) [id number] Page 1 of 7 Visit Report for 02/27/2022 Arrival Information Details Patient Name: Date of Service: [name] 02/27/2022 11:00 A M Medical Record Number: [id number] Patient Account Number: [id number] Date of Birth/Sex: Treating RN: 09-19-1950 (72 y.o. [name] Primary Care [name]: Marissa Mac., [name] Other Clinician: Referring [name]: Treating [name]/Extender: [name] in Treatment: 8 Visit Information History Since Last Visit Added or deleted any medications: No Patient Arrived: Ambulatory Any new allergies or adverse reactions: No Arrival Time: 10:45 Had a fall or experienced change in No Accompanied By: Self activities of daily living that may affect Transfer Assistance: None risk of falls: Patient Identification Verified: Yes Signs or symptoms of abuse/neglect since last visito No Secondary Verification Process Completed: Yes Hospitalized since last visit: No Patient Requires Transmission-Based Precautions: No Implantable device outside of the clinic excluding No Patient Has Alerts: No cellular tissue based products placed in the center since last visit: Has Dressing in Place as Prescribed: Yes Pain Present Now: No Electronic Signature(s) Signed: 03/18/2022 5:14:49 PM By: Sharyn Creamer RN, BSN Entered By: Sharyn Creamer on 02/27/2022 10:45:37 -------------------------------------------------------------------------------- Clinic Level of Care Assessment Details Patient Name: Date of Service: [name]. 02/27/2022 11:00 A M Medical Record Number: [id number] Patient Account Number: [id number] Date of Birth/Sex: Treating RN: 1950-08-28 (72 y.o. [name], [name] Primary Care [name]: Marissa Mac., [name] Other Clinician: Referring [name]: Treating [name]/Extender: [name] in Treatment: 8 Clinic Level of  Care Assessment Items TOOL 4 Quantity Score X- 1 0 Use when only an EandM is performed on FOLLOW-UP visit ASSESSMENTS - Nursing Assessment / Reassessment X- 1 10 Reassessment of Marissa-morbidities (includes updates in patient status) X- 1 5 Reassessment of Adherence to Treatment Plan ASSESSMENTS - Wound and Skin A ssessment / Reassessment X - Simple Wound Assessment / Reassessment - one wound 1 5 '[]'$  - 0 Complex Wound Assessment / Reassessment - multiple wounds '[]'$  - 0 Dermatologic / Skin Assessment (not related to wound area) ASSESSMENTS - Focused Assessment '[]'$  - 0 Circumferential Edema Measurements - multi extremities '[]'$  - 0 Nutritional Assessment / Counseling / Intervention [name] ([id number]) [id number] Page 2 of 7 '[]'$  - 0 Lower Extremity Assessment (monofilament, tuning fork, pulses) '[]'$  - 0 Peripheral Arterial Disease Assessment (using hand held doppler) ASSESSMENTS - Ostomy and/or Continence Assessment and Care '[]'$  - 0 Incontinence Assessment and Management '[]'$  - 0 Ostomy Care Assessment and Management (repouching, etc.) PROCESS - Coordination of Care X - Simple Patient / Family Education for ongoing care 1 15 '[]'$  - 0 Complex (extensive) Patient / Family Education for ongoing care X- 1 10 Staff obtains Programmer, systems, Records, T Results / Process Orders est '[]'$  - 0 Staff telephones HHA, Nursing Homes / Clarify orders / etc '[]'$  - 0 Routine Transfer to another Facility (non-emergent condition) '[]'$  - 0 Routine Hospital Admission (non-emergent condition) '[]'$  - 0 New Admissions / Biomedical engineer / Ordering NPWT Apligraf, etc. , '[]'$  - 0 Emergency Hospital Admission (emergent condition) X- 1 10 Simple Discharge Coordination '[]'$  - 0 Complex (extensive) Discharge Coordination PROCESS - Special Needs '[]'$  - 0 Pediatric / Minor Patient Management '[]'$  - 0 Isolation Patient Management '[]'$  - 0 Hearing / Language / Visual special needs '[]'$  -  0 Assessment of Community assistance (transportation, D/C planning, etc.) '[]'$  - 0 Additional assistance / Altered mentation '[]'$  - 0 Support Surface(s) Assessment (bed, cushion, seat, etc.) INTERVENTIONS -  Wound Cleansing / Measurement X - Simple Wound Cleansing - one wound 1 5 '[]'$  - 0 Complex Wound Cleansing - multiple wounds X- 1 5 Wound Imaging (photographs - any number of wounds) '[]'$  - 0 Wound Tracing (instead of photographs) X- 1 5 Simple Wound Measurement - one wound '[]'$  - 0 Complex Wound Measurement - multiple wounds INTERVENTIONS - Wound Dressings X - Small Wound Dressing one or multiple wounds 1 10 '[]'$  - 0 Medium Wound Dressing one or multiple wounds '[]'$  - 0 Large Wound Dressing one or multiple wounds X- 1 5 Application of Medications - topical '[]'$  - 0 Application of Medications - injection INTERVENTIONS - Miscellaneous '[]'$  - 0 External ear exam '[]'$  - 0 Specimen Collection (cultures, biopsies, blood, body fluids, etc.) '[]'$  - 0 Specimen(s) / Culture(s) sent or taken to Lab for analysis '[]'$  - 0 Patient Transfer (multiple staff / Civil Service fast streamer / Similar devices) '[]'$  - 0 Simple Staple / Suture removal (25 or less) '[]'$  - 0 Complex Staple / Suture removal (26 or more) '[]'$  - 0 Hypo / Hyperglycemic Management (close monitor of Blood Glucose) [name], [name] ([id number]) [id number] Page 3 of 7 '[]'$  - 0 Ankle / Brachial Index (ABI) - do not check if billed separately X- 1 5 Vital Signs Has the patient been seen at the hospital within the last three years: Yes Total Score: 90 Level Of Care: New/Established - Level 3 Electronic Signature(s) Signed: 02/27/2022 5:39:23 PM By: [name] Entered By: [name] on 02/27/2022 11:15:17 -------------------------------------------------------------------------------- Encounter Discharge Information Details Patient Name: Date of Service: [name]. 02/27/2022 11:00 A M Medical Record  Number: [id number] Patient Account Number: [id number] Date of Birth/Sex: Treating RN: March 01, 1951 (72 y.o. [name], [name] Primary Care Phong Isenberg: Marissa Mac., [name] Other Clinician: Referring Abrahm Mancia: Treating Anders Hohmann/Extender: [name] in Treatment: 8 Encounter Discharge Information Items Discharge Condition: Stable Ambulatory Status: Ambulatory Discharge Destination: Home Transportation: Private Auto Accompanied By: self Schedule Follow-up Appointment: Yes Clinical Summary of Care: Patient Declined Electronic Signature(s) Signed: 02/27/2022 5:39:23 PM By: [name] Entered By: [name] on 02/27/2022 11:15:51 -------------------------------------------------------------------------------- Lower Extremity Assessment Details Patient Name: Date of Service: [name]. 02/27/2022 11:00 A M Medical Record Number: [id number] Patient Account Number: [id number] Date of Birth/Sex: Treating RN: November 12, 1950 (72 y.o. [name] Primary Care Juvencio Verdi: Marissa Mac., [name] Other Clinician: Referring Tomara Youngberg: Treating Vrinda Heckstall/Extender: [name] in Treatment: 8 Electronic Signature(s) Signed: 03/18/2022 5:14:49 PM By: Sharyn Creamer RN, BSN Entered By: Sharyn Creamer on 02/27/2022 10:46:16 -------------------------------------------------------------------------------- Multi-Disciplinary Care Plan Details Patient Name: Date of Service: [name]. 02/27/2022 11:00 A M Medical Record Number: [id number] Patient Account Number: [id number] [name], [name] ([id number]) [id number] Page 4 of 7 Date of Birth/Sex: Treating RN: 1950/12/31 (72 y.o. [name], [name] Primary Care Jocelin Schuelke: Other Clinician: [name], [name] Referring Kyrollos Cordell: Treating Yatzil Clippinger/Extender: [name] in Treatment: 8 Active Inactive Pain, Acute or  Chronic Nursing Diagnoses: Pain, acute or chronic: actual or potential Potential alteration in comfort, pain Goals: Patient will verbalize adequate pain control and receive pain control interventions during procedures as needed Date Initiated: 01/02/2022 Target Resolution Date: 03/09/2022 Goal Status: Active Patient/caregiver will verbalize adequate pain control between visits Date Initiated: 01/02/2022 Target Resolution Date: 03/09/2022 Goal Status: Active Interventions: Encourage patient to take pain medications as prescribed Provide education on pain management Reposition patient for comfort Treatment Activities:  Administer pain control measures as ordered : 01/02/2022 Notes: Pressure Nursing Diagnoses: Knowledge deficit related to management of pressures ulcers Goals: Patient/caregiver will verbalize risk factors for pressure ulcer development Date Initiated: 01/02/2022 Target Resolution Date: 03/09/2022 Goal Status: Active Interventions: Assess: immobility, friction, shearing, incontinence upon admission and as needed Assess offloading mechanisms upon admission and as needed Provide education on pressure ulcers Treatment Activities: T ordered outside of clinic : 01/02/2022 est Notes: Electronic Signature(s) Signed: 02/27/2022 5:39:23 PM By: [name] Entered By: [name] on 02/27/2022 11:00:35 -------------------------------------------------------------------------------- Pain Assessment Details Patient Name: Date of Service: [name]. 02/27/2022 11:00 A M Medical Record Number: [id number] Patient Account Number: [id number] Date of Birth/Sex: Treating RN: November 01, 1950 (72 y.o. [name] Primary Care Kadrian Partch: Marissa Mac., [name] Other Clinician: Referring Stephany Poorman: Treating Keigo Whalley/Extender: [name] in Treatment: [address] FILOMENA, [name] ([id number])  [id number] Page 5 of 7 Location of Pain Severity and Description of Pain Patient Has Paino No Site Locations Pain Management and Medication Current Pain Management: Electronic Signature(s) Signed: 03/18/2022 5:14:49 PM By: Sharyn Creamer RN, BSN Entered By: Sharyn Creamer on 02/27/2022 10:46:10 -------------------------------------------------------------------------------- Patient/Caregiver Education Details Patient Name: Date of Service: [name] 12/20/2023andnbsp11:00 A M Medical Record Number: [id number] Patient Account Number: [id number] Date of Birth/Gender: Treating RN: [birth year] (72 y.o. [name], [name] Primary Care Physician: Marissa Mac., [name] Other Clinician: Referring Physician: Treating Physician/Extender: [name] in Treatment: 8 Education Assessment Education Provided To: Patient Education Topics Provided Wound/Skin Impairment: Methods: Explain/Verbal Responses: Reinforcements needed, State content correctly Electronic Signature(s) Signed: 02/27/2022 5:39:23 PM By: [name] Entered By: [name] on 02/27/2022 11:00:47 -------------------------------------------------------------------------------- Wound Assessment Details Patient Name: Date of Service: [name]. 02/27/2022 11:00 [name], [name] ([id number]) [id number] Page 6 of 7 Medical Record Number: [id number] Patient Account Number: [id number] Date of Birth/Sex: Treating RN: 1950-10-26 (72 y.o. [name] Primary Care Majorie Santee: Marissa Mac., [name] Other Clinician: Referring Gracelin Weisberg: Treating Tracker Mance/Extender: [name] in Treatment: 8 Wound Status Wound Number: 1 Primary Etiology: Pressure Ulcer Wound Location: Sacrum Wound Status: Open Wounding Event: Pressure Injury Comorbid History: Colitis Date Acquired: 10/09/2021 Weeks Of  Treatment: 8 Clustered Wound: No Photos Wound Measurements Length: (cm) 0.4 Width: (cm) 0.2 Depth: (cm) 0.5 Area: (cm) 0.063 Volume: (cm) 0.031 % Reduction in Area: 82.2% % Reduction in Volume: 87.4% Tunneling: No Undermining: Yes Starting Position (o'clock): 9 Ending Position (o'clock): 3 Maximum Distance: (cm) 2.5 Wound Description Classification: Category/Stage IV Exudate Amount: Medium Exudate Type: Serosanguineous Exudate Color: red, brown Wound Bed Granulation Amount: Large (67-100%) Necrotic Amount: Small (1-33%) Necrotic Quality: Adherent Slough Periwound Skin Texture Texture Color No Abnormalities Noted: No No Abnormalities Noted: No Moisture No Abnormalities Noted: No Treatment Notes Wound #1 (Sacrum) Cleanser Wound Cleanser Discharge Instruction: Cleanse the wound with wound cleanser prior to applying a clean dressing using gauze sponges, not tissue or cotton balls. Peri-Wound Care Skin Prep Discharge Instruction: Use skin prep as directed Topical Primary Dressing Hydrofera Blue Classic Foam Rope Dressing, 9x6 (mm/in) Discharge Instruction: Moisten with saline prior to packing. May cut rope in half if having a hard time packing into wound. [name], [name] ([id number]) [id number] Page 7 of 7 Secondary Dressing Zetuvit Plus Silicone Border Dressing 4x4 (in/in) Discharge Instruction: Apply silicone border over primary dressing as directed. Secured With Compression Wrap Compression Stockings Environmental education officer) Signed: 03/18/2022  5:14:49 PM By: Sharyn Creamer RN, BSN Entered By: Sharyn Creamer on 02/27/2022 10:49:36 -------------------------------------------------------------------------------- [place] Details Patient Name: Date of Service: [name]. 02/27/2022 11:00 A M Medical Record Number: [id number] Patient Account Number: [id number] Date of Birth/Sex: Treating RN: [birth year] (72 y.o. Ardelle Balls,  Morey Hummingbird Primary Care Jenyfer Trawick: Marissa Mac., [name] Other Clinician: Referring Dung Salinger: Treating Marizol Borror/Extender: [name] in Treatment: 8 Vital Signs Time Taken: 10:45 Temperature (F): 98.3 Height (in): 64 Pulse (bpm): 83 Weight (lbs): 97 Respiratory Rate (breaths/min): 18 Body Mass Index (BMI): 16.6 Blood Pressure (mmHg): 133/84 Reference Range: 80 - 120 mg / dl Electronic Signature(s) Signed: 03/18/2022 5:14:49 PM By: Sharyn Creamer RN, BSN Entered By: Sharyn Creamer on 02/27/2022 10:46:00

## 2022-03-20 ENCOUNTER — Encounter (HOSPITAL_BASED_OUTPATIENT_CLINIC_OR_DEPARTMENT_OTHER): Payer: Medicare Other | Admitting: Physician Assistant

## 2022-03-20 DIAGNOSIS — H109 Unspecified conjunctivitis: Secondary | ICD-10-CM | POA: Diagnosis not present

## 2022-03-20 DIAGNOSIS — F17210 Nicotine dependence, cigarettes, uncomplicated: Secondary | ICD-10-CM | POA: Diagnosis not present

## 2022-03-20 DIAGNOSIS — M797 Fibromyalgia: Secondary | ICD-10-CM | POA: Diagnosis not present

## 2022-03-20 DIAGNOSIS — Z79899 Other long term (current) drug therapy: Secondary | ICD-10-CM | POA: Diagnosis not present

## 2022-03-20 DIAGNOSIS — I499 Cardiac arrhythmia, unspecified: Secondary | ICD-10-CM | POA: Diagnosis not present

## 2022-03-20 DIAGNOSIS — J690 Pneumonitis due to inhalation of food and vomit: Secondary | ICD-10-CM | POA: Diagnosis not present

## 2022-03-20 DIAGNOSIS — Z791 Long term (current) use of non-steroidal anti-inflammatories (NSAID): Secondary | ICD-10-CM | POA: Diagnosis not present

## 2022-03-20 DIAGNOSIS — Z8673 Personal history of transient ischemic attack (TIA), and cerebral infarction without residual deficits: Secondary | ICD-10-CM | POA: Diagnosis not present

## 2022-03-20 DIAGNOSIS — J440 Chronic obstructive pulmonary disease with acute lower respiratory infection: Secondary | ICD-10-CM | POA: Diagnosis not present

## 2022-03-20 DIAGNOSIS — G894 Chronic pain syndrome: Secondary | ICD-10-CM | POA: Diagnosis not present

## 2022-03-20 DIAGNOSIS — J9601 Acute respiratory failure with hypoxia: Secondary | ICD-10-CM | POA: Diagnosis not present

## 2022-03-20 DIAGNOSIS — K219 Gastro-esophageal reflux disease without esophagitis: Secondary | ICD-10-CM | POA: Diagnosis not present

## 2022-03-20 DIAGNOSIS — D62 Acute posthemorrhagic anemia: Secondary | ICD-10-CM | POA: Diagnosis not present

## 2022-03-20 DIAGNOSIS — Z79891 Long term (current) use of opiate analgesic: Secondary | ICD-10-CM | POA: Diagnosis not present

## 2022-03-20 DIAGNOSIS — K5790 Diverticulosis of intestine, part unspecified, without perforation or abscess without bleeding: Secondary | ICD-10-CM | POA: Diagnosis not present

## 2022-03-20 DIAGNOSIS — Z7951 Long term (current) use of inhaled steroids: Secondary | ICD-10-CM | POA: Diagnosis not present

## 2022-03-20 DIAGNOSIS — M1990 Unspecified osteoarthritis, unspecified site: Secondary | ICD-10-CM | POA: Diagnosis not present

## 2022-03-20 DIAGNOSIS — I10 Essential (primary) hypertension: Secondary | ICD-10-CM | POA: Diagnosis not present

## 2022-03-21 DIAGNOSIS — Z79899 Other long term (current) drug therapy: Secondary | ICD-10-CM | POA: Diagnosis not present

## 2022-03-21 DIAGNOSIS — G4700 Insomnia, unspecified: Secondary | ICD-10-CM | POA: Diagnosis not present

## 2022-03-21 DIAGNOSIS — M542 Cervicalgia: Secondary | ICD-10-CM | POA: Diagnosis not present

## 2022-03-21 DIAGNOSIS — R64 Cachexia: Secondary | ICD-10-CM | POA: Diagnosis not present

## 2022-03-21 DIAGNOSIS — Z0000 Encounter for general adult medical examination without abnormal findings: Secondary | ICD-10-CM | POA: Diagnosis not present

## 2022-03-21 DIAGNOSIS — R030 Elevated blood-pressure reading, without diagnosis of hypertension: Secondary | ICD-10-CM | POA: Diagnosis not present

## 2022-03-21 DIAGNOSIS — F32A Depression, unspecified: Secondary | ICD-10-CM | POA: Diagnosis not present

## 2022-03-21 DIAGNOSIS — K861 Other chronic pancreatitis: Secondary | ICD-10-CM | POA: Diagnosis not present

## 2022-03-21 DIAGNOSIS — J449 Chronic obstructive pulmonary disease, unspecified: Secondary | ICD-10-CM | POA: Diagnosis not present

## 2022-03-21 DIAGNOSIS — E2740 Unspecified adrenocortical insufficiency: Secondary | ICD-10-CM | POA: Diagnosis not present

## 2022-03-22 DIAGNOSIS — H109 Unspecified conjunctivitis: Secondary | ICD-10-CM | POA: Diagnosis not present

## 2022-03-22 DIAGNOSIS — K219 Gastro-esophageal reflux disease without esophagitis: Secondary | ICD-10-CM | POA: Diagnosis not present

## 2022-03-22 DIAGNOSIS — K5790 Diverticulosis of intestine, part unspecified, without perforation or abscess without bleeding: Secondary | ICD-10-CM | POA: Diagnosis not present

## 2022-03-22 DIAGNOSIS — I499 Cardiac arrhythmia, unspecified: Secondary | ICD-10-CM | POA: Diagnosis not present

## 2022-03-22 DIAGNOSIS — D62 Acute posthemorrhagic anemia: Secondary | ICD-10-CM | POA: Diagnosis not present

## 2022-03-22 DIAGNOSIS — I10 Essential (primary) hypertension: Secondary | ICD-10-CM | POA: Diagnosis not present

## 2022-03-22 DIAGNOSIS — G894 Chronic pain syndrome: Secondary | ICD-10-CM | POA: Diagnosis not present

## 2022-03-22 DIAGNOSIS — Z79899 Other long term (current) drug therapy: Secondary | ICD-10-CM | POA: Diagnosis not present

## 2022-03-22 DIAGNOSIS — Z791 Long term (current) use of non-steroidal anti-inflammatories (NSAID): Secondary | ICD-10-CM | POA: Diagnosis not present

## 2022-03-22 DIAGNOSIS — F17210 Nicotine dependence, cigarettes, uncomplicated: Secondary | ICD-10-CM | POA: Diagnosis not present

## 2022-03-22 DIAGNOSIS — M797 Fibromyalgia: Secondary | ICD-10-CM | POA: Diagnosis not present

## 2022-03-22 DIAGNOSIS — J9601 Acute respiratory failure with hypoxia: Secondary | ICD-10-CM | POA: Diagnosis not present

## 2022-03-22 DIAGNOSIS — M1990 Unspecified osteoarthritis, unspecified site: Secondary | ICD-10-CM | POA: Diagnosis not present

## 2022-03-22 DIAGNOSIS — Z8673 Personal history of transient ischemic attack (TIA), and cerebral infarction without residual deficits: Secondary | ICD-10-CM | POA: Diagnosis not present

## 2022-03-22 DIAGNOSIS — J690 Pneumonitis due to inhalation of food and vomit: Secondary | ICD-10-CM | POA: Diagnosis not present

## 2022-03-22 DIAGNOSIS — Z79891 Long term (current) use of opiate analgesic: Secondary | ICD-10-CM | POA: Diagnosis not present

## 2022-03-22 DIAGNOSIS — J440 Chronic obstructive pulmonary disease with acute lower respiratory infection: Secondary | ICD-10-CM | POA: Diagnosis not present

## 2022-03-22 DIAGNOSIS — Z7951 Long term (current) use of inhaled steroids: Secondary | ICD-10-CM | POA: Diagnosis not present

## 2022-03-25 DIAGNOSIS — I10 Essential (primary) hypertension: Secondary | ICD-10-CM | POA: Diagnosis not present

## 2022-03-25 DIAGNOSIS — M797 Fibromyalgia: Secondary | ICD-10-CM | POA: Diagnosis not present

## 2022-03-25 DIAGNOSIS — J440 Chronic obstructive pulmonary disease with acute lower respiratory infection: Secondary | ICD-10-CM | POA: Diagnosis not present

## 2022-03-25 DIAGNOSIS — Z8673 Personal history of transient ischemic attack (TIA), and cerebral infarction without residual deficits: Secondary | ICD-10-CM | POA: Diagnosis not present

## 2022-03-25 DIAGNOSIS — M1990 Unspecified osteoarthritis, unspecified site: Secondary | ICD-10-CM | POA: Diagnosis not present

## 2022-03-25 DIAGNOSIS — Z791 Long term (current) use of non-steroidal anti-inflammatories (NSAID): Secondary | ICD-10-CM | POA: Diagnosis not present

## 2022-03-25 DIAGNOSIS — Z7951 Long term (current) use of inhaled steroids: Secondary | ICD-10-CM | POA: Diagnosis not present

## 2022-03-25 DIAGNOSIS — G894 Chronic pain syndrome: Secondary | ICD-10-CM | POA: Diagnosis not present

## 2022-03-25 DIAGNOSIS — F17210 Nicotine dependence, cigarettes, uncomplicated: Secondary | ICD-10-CM | POA: Diagnosis not present

## 2022-03-25 DIAGNOSIS — K5790 Diverticulosis of intestine, part unspecified, without perforation or abscess without bleeding: Secondary | ICD-10-CM | POA: Diagnosis not present

## 2022-03-25 DIAGNOSIS — Z79899 Other long term (current) drug therapy: Secondary | ICD-10-CM | POA: Diagnosis not present

## 2022-03-25 DIAGNOSIS — D62 Acute posthemorrhagic anemia: Secondary | ICD-10-CM | POA: Diagnosis not present

## 2022-03-25 DIAGNOSIS — J9601 Acute respiratory failure with hypoxia: Secondary | ICD-10-CM | POA: Diagnosis not present

## 2022-03-25 DIAGNOSIS — H109 Unspecified conjunctivitis: Secondary | ICD-10-CM | POA: Diagnosis not present

## 2022-03-25 DIAGNOSIS — Z79891 Long term (current) use of opiate analgesic: Secondary | ICD-10-CM | POA: Diagnosis not present

## 2022-03-25 DIAGNOSIS — J690 Pneumonitis due to inhalation of food and vomit: Secondary | ICD-10-CM | POA: Diagnosis not present

## 2022-03-25 DIAGNOSIS — K219 Gastro-esophageal reflux disease without esophagitis: Secondary | ICD-10-CM | POA: Diagnosis not present

## 2022-03-25 DIAGNOSIS — I499 Cardiac arrhythmia, unspecified: Secondary | ICD-10-CM | POA: Diagnosis not present

## 2022-03-27 ENCOUNTER — Encounter (HOSPITAL_BASED_OUTPATIENT_CLINIC_OR_DEPARTMENT_OTHER): Payer: 59 | Attending: Physician Assistant | Admitting: Physician Assistant

## 2022-03-27 DIAGNOSIS — L89153 Pressure ulcer of sacral region, stage 3: Secondary | ICD-10-CM | POA: Insufficient documentation

## 2022-03-27 DIAGNOSIS — L89154 Pressure ulcer of sacral region, stage 4: Secondary | ICD-10-CM | POA: Insufficient documentation

## 2022-03-27 DIAGNOSIS — E43 Unspecified severe protein-calorie malnutrition: Secondary | ICD-10-CM | POA: Diagnosis not present

## 2022-03-27 DIAGNOSIS — Z681 Body mass index (BMI) 19 or less, adult: Secondary | ICD-10-CM | POA: Diagnosis not present

## 2022-03-27 NOTE — Progress Notes (Addendum)
[name] ([id number]) [id number] Page 1 of 6 Visit Report for 03/27/2022 Chief Complaint Document Details Patient Name: Date of Service: [name] 03/27/2022 12:30 PM Medical Record Number: [id number] Patient Account Number: [id number] Date of Birth/Sex: Treating RN: May 23, 1950 (72 y.o. F) Primary Care Provider: [name], [name] Other Clinician: Referring Provider: Treating Provider/Extender: [name] in Treatment: 12 Information Obtained from: Patient Chief Complaint Pressure ulcer stage 3 Sacrum Electronic Signature(s) Signed: 03/27/2022 12:47:55 PM By: Worthy Keeler PA-C Entered By: Worthy Keeler on 03/27/2022 12:47:55 -------------------------------------------------------------------------------- HPI Details Patient Name: Date of Service: CO Marissa Compton Cardenas. 03/27/2022 12:30 PM Medical Record Number: [id number] Patient Account Number: [id number] Date of Birth/Sex: Treating RN: [birth year] (72 y.o. F) Primary Care Provider: [name], [name] Other Clinician: Referring Provider: Treating Provider/Extender: [name] in Treatment: 12 History of Present Illness HPI Description: 01-02-22 upon evaluation today patient presents for initial inspection here in our clinic concerning a wound that she has over the sacral region. This is stated to be present since around the beginning of August 2023. She currently resides in a assisted nursing facility. She does seem to be able to answer questions fully today. Subsequently I do note that she is a little on the thin side but again other than the protein calorie malnutrition she is minimally weak but still does get up and move around some but she also tells me that she "sits a lot". She has not had any x-rays of the sacral region at this point. 01-09-2022 upon evaluation today patient's wound in the sacral area actually appears to be  doing decently well. Fortunately I do not see any signs of infection which is great news and overall I am extremely pleased with where things stand today. 11/8; this is a patient who lives in some form of small assisted living in [place]. She has a deep wound over the lower part of her coccyx. An x-ray that we ordered from last time did not show any osseous abnormalities. We are supposed to be using Hydrofera Blue in the wound but I am really not sure what they are using to dress this and who is doing it. Talking to our staff they have apparently discussed this with the staff in the facility. She does not have home health. 01-23-2022 upon evaluation today patient appears to be doing decently well in regard to her wound. She has been tolerating the dressing changes without complication although I am not certain the dressing changes have been done appropriately over the past couple of weeks. We have been trying to get her to come in here we also try to get her into a wound care center in [place] which would be closer for the assisted living facility. Unfortunately neither 1 of those were undertaken up to this point. The patient did end up having some training of the staff from an RN that they brought him to have the staff perform the dressing changes but that being said it does not sound like this has been done correctly. Nonetheless I do believe that based on what we see it would benefit the patient to actually have her come here 3 times a week also think a wound VAC could potentially be a possibility for her which would help to get things moving in a much better direction as far as healing is concerned. We need to get this to fill-in though it looks clean and  does not look infected I do think that we need to really get this to fill-in and there is quite a bit of undermining that is to be considered here. 01-30-2022 upon evaluation today patient appears to be doing well currently in regard to her wound  which is looking pretty decent but still has quite a bit of space underneath as far as undermining is concerned. I think she might possibly be better with a wound VAC. With that being said if when I do this we probably only be able to do it 2 times a week at most. I discussed that with the patient today she is okay with that we just need to see if we can get the insurance approval and then of course the scheduling underway. 02-13-2022 upon evaluation today patient appears to be doing well currently in regard to her wound. She actually tells me that it is feeling a lot better which is good news. Fortunately I do not see any signs of active infection at this time. No fevers, chills, nausea, vomiting, or diarrhea. 02-27-2022 upon evaluation today patient appears to be doing well currently in regard to her wound. She is actually showing some signs of improvement we are [name], BECKENDORF Cardenas ([id number]) [id number] Page 2 of 6 still obtaining the approval for the snap VAC which I think could be beneficial in the meantime we been using the Hydrofera Blue rope. 03-27-2022 upon evaluation today patient appears to be doing okay in regard to her wound but there was no dressing in place upon arrival today. Fortunately I see again no evidence of infection. No fevers, chills, nausea, vomiting, or diarrhea. Electronic Signature(s) Signed: 03/27/2022 1:40:19 PM By: Worthy Keeler PA-C Entered By: Worthy Keeler on 03/27/2022 13:40:19 -------------------------------------------------------------------------------- Physical Exam Details Patient Name: Date of Service: Marissa Perking Cardenas. 03/27/2022 12:30 PM Medical Record Number: [id number] Patient Account Number: [id number] Date of Birth/Sex: Treating RN: 1950/04/26 (72 y.o. F) Primary Care Provider: [name], [name] Other Clinician: Referring Provider: Treating Provider/Extender: [name] in Treatment:  7 Constitutional Well-nourished and well-hydrated in no acute distress. Respiratory normal breathing without difficulty. Psychiatric this patient is able to make decisions and demonstrates good insight into disease process. Alert and Oriented x 3. pleasant and cooperative. Notes Upon inspection patient's wound bed actually showed signs of good granulation epithelization at this point. Unfortunately this is a very tiny wound that I do not even believe would be able to utilize a snap VAC for her to be perfectly honest. With that being said I do think packing is going to be crucial and subsequently I would recommend that we go ahead and see about getting the patient started with a iodoform gauze packing which I think done lightly would probably be the best way to go I would definitely recommend quarter-inch. Electronic Signature(s) Signed: 03/27/2022 1:41:29 PM By: Worthy Keeler PA-C Entered By: Worthy Keeler on 03/27/2022 13:41:28 -------------------------------------------------------------------------------- Physician Orders Details Patient Name: Date of Service: CO Marissa Compton Cardenas. 03/27/2022 12:30 PM Medical Record Number: [id number] Patient Account Number: [id number] Date of Birth/Sex: Treating RN: 1950-05-09 (72 y.o. [name], [name] Primary Care Provider: [name], [name] Other Clinician: Referring Provider: Treating Provider/Extender: [name] in Treatment: 12 Verbal / Phone Orders: No Diagnosis Coding ICD-10 Coding Code Description M62.81 Muscle weakness (generalized) E43 Unspecified severe protein-calorie malnutrition L89.154 Pressure ulcer of sacral region, stage 4 Follow-up Appointments ppointment in 1 week. -  Burman Blacksmith on WEdnesday 04/03/22 @ 1:15 Rm # 9 w/ Allayne Butcher Return A Other: - Administrator Arbutus Ped) : [phone] MARVALEE, MCBROOM ([id number]) [id number] Page 3 of 6 ***Facility to change Monday,  WEdnesday, FRiday*** ***If coming to facility weekly, it's okay to let [facility] change on Wednesday*** Anesthetic (In clinic) Topical Lidocaine 5% applied to wound bed Off-Loading Turn and reposition every 2 hours [facility] to [facility] for skilled nursing wound care. May utilize formulary equivalent dressing for wound treatment orders unless otherwise specified. - ADmit to [facility]. Home health to change Monday's and Friday's, wound care center to change Wednesday's. Wound Treatment Wound #1 - Sacrum Cleanser: Wound Cleanser (Home Health) 3 x Per Week/30 Days Discharge Instructions: Cleanse the wound with wound cleanser prior to applying a clean dressing using gauze sponges, not tissue or cotton balls. Peri-Wound Care: Skin Prep (Home Health) 3 x Per Week/30 Days Discharge Instructions: Use skin prep as directed Prim Dressing: Iodoform packing strip 1/4 (in) 3 x Per Week/30 Days ary Discharge Instructions: Lightly pack as instructed. Please pack lightly into wound and all around the undermining as it is 4cm all around. This needs to be done 3 x a week. Secondary Dressing: Zetuvit Plus Silicone Border Dressing 4x4 (in/in) (Home Health) 3 x Per Week/30 Days Discharge Instructions: Apply silicone border over primary dressing as directed. Electronic Signature(s) Signed: 03/29/2022 10:16:01 AM By: Worthy Keeler PA-C Signed: 04/29/2022 10:40:58 AM By: [name] Previous Signature: 03/27/2022 3:53:33 PM Version By: Worthy Keeler PA-C Entered By: [name] on 03/29/2022 09:56:06 -------------------------------------------------------------------------------- Problem List Details Patient Name: Date of Service: CO Marissa Compton Cardenas. 03/27/2022 12:30 PM Medical Record Number: [id number] Patient Account Number: [id number] Date of Birth/Sex: Treating RN: [birth year] (72 y.o. F) Primary Care Provider: [name], [name] Other Clinician: Referring  Provider: Treating Provider/Extender: [name] in Treatment: 12 Active Problems ICD-10 Encounter Code Description Active Date MDM Diagnosis M62.81 Muscle weakness (generalized) 01/02/2022 No Yes E43 Unspecified severe protein-calorie malnutrition 01/02/2022 No Yes L89.154 Pressure ulcer of sacral region, stage 4 01/02/2022 No Yes Inactive Problems Resolved Problems KAYLEAH, BEASLEY Cardenas ([id number]) [phone] Page 4 of 6 Electronic Signature(s) Signed: 03/27/2022 12:47:42 PM By: Worthy Keeler PA-C Entered By: Worthy Keeler on 03/27/2022 12:47:41 -------------------------------------------------------------------------------- Progress Note Details Patient Name: Date of Service: CO Marissa Compton Cardenas. 03/27/2022 12:30 PM Medical Record Number: [id number] Patient Account Number: [id number] Date of Birth/Sex: Treating RN: [birth year] (72 y.o. F) Primary Care Provider: [name], [name] Other Clinician: Referring Provider: Treating Provider/Extender: [name] in Treatment: 12 Subjective Chief Complaint Information obtained from Patient Pressure ulcer stage 3 Sacrum History of Present Illness (HPI) 01-02-22 upon evaluation today patient presents for initial inspection here in our clinic concerning a wound that she has over the sacral region. This is stated to be present since around the beginning of August 2023. She currently resides in a assisted nursing facility. She does seem to be able to answer questions fully today. Subsequently I do note that she is a little on the thin side but again other than the protein calorie malnutrition she is minimally weak but still does get up and move around some but she also tells me that she "sits a lot". She has not had any x-rays of the sacral region at this point. 01-09-2022 upon evaluation today patient's wound in the sacral area actually appears to be doing  decently well.  Fortunately I do not see any signs of infection which is great news and overall I am extremely pleased with where things stand today. 11/8; this is a patient who lives in some form of small assisted living in [place]. She has a deep wound over the lower part of her coccyx. An x-ray that we ordered from last time did not show any osseous abnormalities. We are supposed to be using Hydrofera Blue in the wound but I am really not sure what they are using to dress this and who is doing it. Talking to our staff they have apparently discussed this with the staff in the facility. She does not have home health. 01-23-2022 upon evaluation today patient appears to be doing decently well in regard to her wound. She has been tolerating the dressing changes without complication although I am not certain the dressing changes have been done appropriately over the past couple of weeks. We have been trying to get her to come in here we also try to get her into a wound care center in [place] which would be closer for the assisted living facility. Unfortunately neither 1 of those were undertaken up to this point. The patient did end up having some training of the staff from an RN that they brought him to have the staff perform the dressing changes but that being said it does not sound like this has been done correctly. Nonetheless I do believe that based on what we see it would benefit the patient to actually have her come here 3 times a week also think a wound VAC could potentially be a possibility for her which would help to get things moving in a much better direction as far as healing is concerned. We need to get this to fill-in though it looks clean and does not look infected I do think that we need to really get this to fill-in and there is quite a bit of undermining that is to be considered here. 01-30-2022 upon evaluation today patient appears to be doing well currently in regard to her wound which  is looking pretty decent but still has quite a bit of space underneath as far as undermining is concerned. I think she might possibly be better with a wound VAC. With that being said if when I do this we probably only be able to do it 2 times a week at most. I discussed that with the patient today she is okay with that we just need to see if we can get the insurance approval and then of course the scheduling underway. 02-13-2022 upon evaluation today patient appears to be doing well currently in regard to her wound. She actually tells me that it is feeling a lot better which is good news. Fortunately I do not see any signs of active infection at this time. No fevers, chills, nausea, vomiting, or diarrhea. 02-27-2022 upon evaluation today patient appears to be doing well currently in regard to her wound. She is actually showing some signs of improvement we are still obtaining the approval for the snap VAC which I think could be beneficial in the meantime we been using the Hydrofera Blue rope. 03-27-2022 upon evaluation today patient appears to be doing okay in regard to her wound but there was no dressing in place upon arrival today. Fortunately I see again no evidence of infection. No fevers, chills, nausea, vomiting, or diarrhea. Objective Constitutional Well-nourished and well-hydrated in no acute distress. Vitals Time Taken: 12:38 PM, Height: 64 in,  Weight: 97 lbs, BMI: 16.6, Temperature: 98.7 F, Pulse: 74 bpm, Respiratory Rate: 17 breaths/min, Blood Pressure: 115/74 mmHg. Respiratory normal breathing without difficulty. ANHTHU, KRISH Cardenas ([id number]) [id number] Page 5 of 6 Psychiatric this patient is able to make decisions and demonstrates good insight into disease process. Alert and Oriented x 3. pleasant and cooperative. General Notes: Upon inspection patient's wound bed actually showed signs of good granulation epithelization at this point. Unfortunately this is  a very tiny wound that I do not even believe would be able to utilize a snap VAC for her to be perfectly honest. With that being said I do think packing is going to be crucial and subsequently I would recommend that we go ahead and see about getting the patient started with a iodoform gauze packing which I think done lightly would probably be the best way to go I would definitely recommend quarter-inch. Integumentary (Hair, Skin) Wound #1 status is Open. Original cause of wound was Pressure Injury. The date acquired was: 10/09/2021. The wound has been in treatment 12 weeks. The wound is located on the Sacrum. The wound measures 0.4cm length x 0.2cm width x 1cm depth; 0.063cm^2 area and 0.063cm^3 volume. There is Fat Layer (Subcutaneous Tissue) exposed. There is no tunneling noted, however, there is undermining starting at 12:00 and ending at 12:00 with a maximum distance of 4cm. There is a medium amount of serosanguineous drainage noted. There is large (67-100%) granulation within the wound bed. There is a small (1-33%) amount of necrotic tissue within the wound bed including Adherent Slough. The periwound skin appearance did not exhibit: Callus, Crepitus, Excoriation, Induration, Rash, Scarring, Dry/Scaly, Maceration, Atrophie Blanche, Cyanosis, Ecchymosis, Hemosiderin Staining, Mottled, Pallor, Rubor, Erythema. Periwound temperature was noted as No Abnormality. Assessment Active Problems ICD-10 Muscle weakness (generalized) Unspecified severe protein-calorie malnutrition Pressure ulcer of sacral region, stage 4 Plan Follow-up Appointments: Return Appointment in 1 week. Burman Blacksmith on WEdnesday 04/03/22 @ 1:15 Rm # 9 w/ Allayne Butcher Other: - Administrator Arbutus Ped) : [phone] ***Facility to change Monday, WEdnesday, FRiday*** ***If coming to facility weekly, it's okay to let [facility] change on Wednesday*** Anesthetic: (In clinic) Topical Lidocaine 5% applied to wound bed Off-Loading: Turn  and reposition every 2 hours Home Health: Admit to [facility] for skilled nursing wound care. May utilize formulary equivalent dressing for wound treatment orders unless otherwise specified. - ADmit to [facility] for [facility] care. Pt. already receiving physical therapy. Home health to change Monday's and Friday's, wound care center to change Wednesday's. WOUND #1: - Sacrum Wound Laterality: Cleanser: Wound Cleanser (Home Health) 3 x Per Week/30 Days Discharge Instructions: Cleanse the wound with wound cleanser prior to applying a clean dressing using gauze sponges, not tissue or cotton balls. Peri-Wound Care: Skin Prep (Home Health) 3 x Per Week/30 Days Discharge Instructions: Use skin prep as directed Prim Dressing: Iodoform packing strip 1/4 (in) 3 x Per Week/30 Days ary Discharge Instructions: Lightly pack as instructed. Please pack lightly into wound and all around the undermining as it is 4cm all around. This needs to be done 3 x a week. Secondary Dressing: Zetuvit Plus Silicone Border Dressing 4x4 (in/in) (Home Health) 3 x Per Week/30 Days Discharge Instructions: Apply silicone border over primary dressing as directed. 1. I am going to recommend that we have the patient continue to monitor for any signs of infection or worsening. Overall I do feel like that we are headed in the right direction which is great news and I think  that if we have this packed appropriately she should actually do very well. I do think iodoform gauze packing quarter-inch is the right way to go currently. 2. I am also can recommend that we continue with the Zetuvit bordered foam dressing to cover. 3. This should be changed 3 times per week working to see about getting home health to come out to perform the dressing changes. We will see patient back for reevaluation in 1 week here in the clinic. If anything worsens or changes patient will contact our office for additional recommendations. Electronic  Signature(s) Signed: 03/27/2022 1:42:04 PM By: Worthy Keeler PA-C Entered By: Worthy Keeler on 03/27/2022 13:42:04 [name] ([id number]) [id number] Page 6 of 6 -------------------------------------------------------------------------------- SuperBill Details Patient Name: Date of Service: [name] 03/27/2022 Medical Record Number: [id number] Patient Account Number: [id number] Date of Birth/Sex: Treating RN: Oct 20, 1950 (72 y.o. [name], [name] Primary Care Provider: [name], [name] Other Clinician: Referring Provider: Treating Provider/Extender: [name] in Treatment: 12 Diagnosis Coding ICD-10 Codes Code Description M62.81 Muscle weakness (generalized) E43 Unspecified severe protein-calorie malnutrition L89.154 Pressure ulcer of sacral region, stage 4 Facility Procedures : CPT4 Code: [id number] Description: [id number] - WOUND CARE VISIT-LEV 3 EST PT Modifier: Quantity: 1 Physician Procedures : CPT4 Code Description Modifier [id number] - WC PHYS LEVEL 3 - EST PT ICD-10 Diagnosis Description M62.81 Muscle weakness (generalized) E43 Unspecified severe protein-calorie malnutrition L89.154 Pressure ulcer of sacral region, stage 4 Quantity: 1 Electronic Signature(s) Signed: 03/27/2022 1:42:29 PM By: Worthy Keeler PA-C Entered By: Worthy Keeler on 03/27/2022 13:42:29

## 2022-03-28 DIAGNOSIS — J9601 Acute respiratory failure with hypoxia: Secondary | ICD-10-CM | POA: Diagnosis not present

## 2022-03-28 DIAGNOSIS — Z791 Long term (current) use of non-steroidal anti-inflammatories (NSAID): Secondary | ICD-10-CM | POA: Diagnosis not present

## 2022-03-28 DIAGNOSIS — J690 Pneumonitis due to inhalation of food and vomit: Secondary | ICD-10-CM | POA: Diagnosis not present

## 2022-03-28 DIAGNOSIS — Z79891 Long term (current) use of opiate analgesic: Secondary | ICD-10-CM | POA: Diagnosis not present

## 2022-03-28 DIAGNOSIS — K5790 Diverticulosis of intestine, part unspecified, without perforation or abscess without bleeding: Secondary | ICD-10-CM | POA: Diagnosis not present

## 2022-03-28 DIAGNOSIS — M1990 Unspecified osteoarthritis, unspecified site: Secondary | ICD-10-CM | POA: Diagnosis not present

## 2022-03-28 DIAGNOSIS — M797 Fibromyalgia: Secondary | ICD-10-CM | POA: Diagnosis not present

## 2022-03-28 DIAGNOSIS — J440 Chronic obstructive pulmonary disease with acute lower respiratory infection: Secondary | ICD-10-CM | POA: Diagnosis not present

## 2022-03-28 DIAGNOSIS — I499 Cardiac arrhythmia, unspecified: Secondary | ICD-10-CM | POA: Diagnosis not present

## 2022-03-28 DIAGNOSIS — Z79899 Other long term (current) drug therapy: Secondary | ICD-10-CM | POA: Diagnosis not present

## 2022-03-28 DIAGNOSIS — D62 Acute posthemorrhagic anemia: Secondary | ICD-10-CM | POA: Diagnosis not present

## 2022-03-28 DIAGNOSIS — H109 Unspecified conjunctivitis: Secondary | ICD-10-CM | POA: Diagnosis not present

## 2022-03-28 DIAGNOSIS — G894 Chronic pain syndrome: Secondary | ICD-10-CM | POA: Diagnosis not present

## 2022-03-28 DIAGNOSIS — Z8673 Personal history of transient ischemic attack (TIA), and cerebral infarction without residual deficits: Secondary | ICD-10-CM | POA: Diagnosis not present

## 2022-03-28 DIAGNOSIS — K219 Gastro-esophageal reflux disease without esophagitis: Secondary | ICD-10-CM | POA: Diagnosis not present

## 2022-03-28 DIAGNOSIS — I10 Essential (primary) hypertension: Secondary | ICD-10-CM | POA: Diagnosis not present

## 2022-03-28 DIAGNOSIS — Z7951 Long term (current) use of inhaled steroids: Secondary | ICD-10-CM | POA: Diagnosis not present

## 2022-03-28 DIAGNOSIS — F17210 Nicotine dependence, cigarettes, uncomplicated: Secondary | ICD-10-CM | POA: Diagnosis not present

## 2022-03-29 ENCOUNTER — Encounter (HOSPITAL_BASED_OUTPATIENT_CLINIC_OR_DEPARTMENT_OTHER): Payer: 59 | Admitting: Internal Medicine

## 2022-03-29 DIAGNOSIS — L89153 Pressure ulcer of sacral region, stage 3: Secondary | ICD-10-CM | POA: Diagnosis not present

## 2022-03-29 DIAGNOSIS — L89154 Pressure ulcer of sacral region, stage 4: Secondary | ICD-10-CM | POA: Diagnosis not present

## 2022-03-29 DIAGNOSIS — E43 Unspecified severe protein-calorie malnutrition: Secondary | ICD-10-CM | POA: Diagnosis not present

## 2022-03-29 DIAGNOSIS — Z681 Body mass index (BMI) 19 or less, adult: Secondary | ICD-10-CM | POA: Diagnosis not present

## 2022-03-29 NOTE — Progress Notes (Signed)
[name], [name] ([id number]) [id number] Page 1 of 5 Visit Report for 03/29/2022 Arrival Information Details Patient Name: Date of Service: [name]. 03/29/2022 11:30 A M Medical Record Number: [id number] Patient Account Number: [id number] Date of Birth/Sex: Treating RN: [name][birth year] (72 y.o. F) Primary Care Madine Sarr: [name], [name] Other Clinician: Referring Ayza Ripoll: Treating Kolleen Ochsner/Extender: Dalene Seltzer in Treatment: 12 Visit Information History Since Last Visit Added or deleted any medications: No Patient Arrived: Ambulatory Any new allergies or adverse reactions: No Arrival Time: 11:45 Had a fall or experienced change in No Accompanied By: self activities of daily living that Marissa affect Transfer Assistance: None risk of falls: Patient Identification Verified: Yes Signs or symptoms of abuse/neglect since last visito No Secondary Verification Process Completed: Yes Hospitalized since last visit: No Patient Requires Transmission-Based Precautions: No Implantable device outside of the clinic excluding No Patient Has Alerts: No cellular tissue based products placed in the center since last visit: Has Dressing in Place as Prescribed: Yes Pain Present Now: No Electronic Signature(s) Signed: 03/29/2022 11:49:43 AM By: Erenest Blank Entered By: Erenest Blank on 03/29/2022 11:45:25 -------------------------------------------------------------------------------- Clinic Level of Care Assessment Details Patient Name: Date of Service: [name] 03/29/2022 11:30 A M Medical Record Number: [id number] Patient Account Number: [id number] Date of Birth/Sex: Treating RN: [name], [name] (72 y.o. F) Primary Care Espn Zeman: [name], [name] Other Clinician: Referring Kwame Ryland: Treating Alexsis Kathman/Extender: Dalene Seltzer in Treatment: 12 Clinic Level of Care Assessment Items TOOL 4  Quantity Score X- 1 0 Use when only an EandM is performed on FOLLOW-UP visit ASSESSMENTS - Nursing Assessment / Reassessment X- 1 10 Reassessment of Marissa-morbidities (includes updates in patient status) X- 1 5 Reassessment of Adherence to Treatment Plan ASSESSMENTS - Wound and Skin A ssessment / Reassessment X - Simple Wound Assessment / Reassessment - one wound 1 5 '[]'$  - 0 Complex Wound Assessment / Reassessment - multiple wounds '[]'$  - 0 Dermatologic / Skin Assessment (not related to wound area) ASSESSMENTS - Focused Assessment '[]'$  - 0 Circumferential Edema Measurements - multi extremities '[]'$  - 0 Nutritional Assessment / Counseling / Intervention TAMYRA, FOJTIK Cardenas ([id number]) [id number].pdf Page 2 of 5 '[]'$  - 0 Lower Extremity Assessment (monofilament, tuning fork, pulses) '[]'$  - 0 Peripheral Arterial Disease Assessment (using hand held doppler) ASSESSMENTS - Ostomy and/or Continence Assessment and Care '[]'$  - 0 Incontinence Assessment and Management '[]'$  - 0 Ostomy Care Assessment and Management (repouching, etc.) PROCESS - Coordination of Care X - Simple Patient / Family Education for ongoing care 1 15 '[]'$  - 0 Complex (extensive) Patient / Family Education for ongoing care '[]'$  - 0 Staff obtains Programmer, systems, Records, T Results / Process Orders est '[]'$  - 0 Staff telephones HHA, Nursing Homes / Clarify orders / etc '[]'$  - 0 Routine Transfer to another Facility (non-emergent condition) '[]'$  - 0 Routine Hospital Admission (non-emergent condition) '[]'$  - 0 New Admissions / Biomedical engineer / Ordering NPWT Apligraf, etc. , '[]'$  - 0 Emergency Hospital Admission (emergent condition) X- 1 10 Simple Discharge Coordination '[]'$  - 0 Complex (extensive) Discharge Coordination PROCESS - Special Needs '[]'$  - 0 Pediatric / Minor Patient Management '[]'$  - 0 Isolation Patient Management '[]'$  - 0 Hearing / Language / Visual special needs '[]'$  - 0 Assessment of Community assistance  (transportation, D/C planning, etc.) '[]'$  - 0 Additional assistance / Altered mentation '[]'$  - 0 Support Surface(s) Assessment (bed, cushion, seat, etc.) INTERVENTIONS - Wound Cleansing / Measurement X - Simple  Wound Cleansing - one wound 1 5 '[]'$  - 0 Complex Wound Cleansing - multiple wounds '[]'$  - 0 Wound Imaging (photographs - any number of wounds) '[]'$  - 0 Wound Tracing (instead of photographs) '[]'$  - 0 Simple Wound Measurement - one wound '[]'$  - 0 Complex Wound Measurement - multiple wounds INTERVENTIONS - Wound Dressings X - Small Wound Dressing one or multiple wounds 1 10 '[]'$  - 0 Medium Wound Dressing one or multiple wounds '[]'$  - 0 Large Wound Dressing one or multiple wounds '[]'$  - 0 Application of Medications - topical '[]'$  - 0 Application of Medications - injection INTERVENTIONS - Miscellaneous '[]'$  - 0 External ear exam '[]'$  - 0 Specimen Collection (cultures, biopsies, blood, body fluids, etc.) '[]'$  - 0 Specimen(s) / Culture(s) sent or taken to Lab for analysis '[]'$  - 0 Patient Transfer (multiple staff / Civil Service fast streamer / Similar devices) '[]'$  - 0 Simple Staple / Suture removal (25 or less) '[]'$  - 0 Complex Staple / Suture removal (26 or more) '[]'$  - 0 Hypo / Hyperglycemic Management (close monitor of Blood Glucose) [name], [name] ([id number]) [phone].pdf Page 3 of 5 '[]'$  - 0 Ankle / Brachial Index (ABI) - do not check if billed separately '[]'$  - 0 Vital Signs Has the patient been seen at the hospital within the last three years: Yes Total Score: 60 Level Of Care: New/Established - Level 2 Electronic Signature(s) Signed: 03/29/2022 11:49:43 AM By: Erenest Blank Entered By: Erenest Blank on 03/29/2022 11:48:02 -------------------------------------------------------------------------------- Encounter Discharge Information Details Patient Name: Date of Service: Marissa Perking Cardenas. 03/29/2022 11:30 A M Medical Record Number: [id number] Patient Account Number:  [id number] Date of Birth/Sex: Treating RN: Jul [birth year], [name] (72 y.o. F) Primary Care Adelfa Lozito: [name], [name] Other Clinician: Referring Aliena Ghrist: Treating Annica Marinello/Extender: Dalene Seltzer in Treatment: 12 Encounter Discharge Information Items Discharge Condition: Stable Ambulatory Status: Ambulatory Discharge Destination: Home Transportation: Private Auto Accompanied By: self Schedule Follow-up Appointment: Yes Clinical Summary of Care: Electronic Signature(s) Signed: 03/29/2022 11:49:43 AM By: Erenest Blank Entered By: Erenest Blank on 03/29/2022 11:48:42 -------------------------------------------------------------------------------- Patient/Caregiver Education Details Patient Name: Date of Service: [name] 1/19/2024andnbsp11:30 [place] Record Number: [id number] Patient Account Number: [id number] Date of Birth/Gender: Treating RN: July [birth year], [name] (72 y.o. F) Primary Care Physician: [name], [name] Other Clinician: Erenest Blank Referring Physician: Treating Physician/Extender: Genice Rouge., [name] in Treatment: 12 Education Assessment Education Provided To: Patient Education Topics Provided Electronic Signature(s) Signed: 03/29/2022 11:49:43 AM By: Erenest Blank Entered By: Erenest Blank on 03/29/2022 11:48:29 [name] ([id number]) [id number] Page 4 of 5 -------------------------------------------------------------------------------- Wound Assessment Details Patient Name: Date of Service: [name] 03/29/2022 11:30 A M Medical Record Number: [id number] Patient Account Number: [id number] Date of Birth/Sex: Treating RN: [name] (72 y.o. F) Primary Care Aylan Bayona: [name], [name] Other Clinician: Referring Makiyah Zentz: Treating Aalaya Yadao/Extender: Dalene Seltzer in Treatment: 12 Wound Status Wound Number: 1 Primary Etiology: Pressure  Ulcer Wound Location: Sacrum Wound Status: Open Wounding Event: Pressure Injury Date Acquired: 10/09/2021 Weeks Of Treatment: 12 Clustered Wound: No Wound Measurements Length: (cm) 0.4 Width: (cm) 0.2 Depth: (cm) 1 Area: (cm) 0.063 Volume: (cm) 0.063 % Reduction in Area: 82.2% % Reduction in Volume: 74.5% Wound Description Classification: Category/Stage IV Exudate Amount: Medium Exudate Type: Serosanguineous Exudate Color: red, brown Periwound Skin Texture Texture Color No Abnormalities Noted: No No Abnormalities Noted: No Moisture No Abnormalities Noted: No Treatment Notes Wound #1 (Sacrum) Cleanser Wound Cleanser Discharge  Instruction: Cleanse the wound with wound cleanser prior to applying a clean dressing using gauze sponges, not tissue or cotton balls. Peri-Wound Care Skin Prep Discharge Instruction: Use skin prep as directed Topical Primary Dressing Iodoform packing strip 1/4 (in) Discharge Instruction: Lightly pack as instructed. Please pack lightly into wound and all around the undermining as it is 4cm all around. This needs to be done 3 x a week. Secondary Dressing Zetuvit Plus Silicone Border Dressing 4x4 (in/in) Discharge Instruction: Apply silicone border over primary dressing as directed. Secured With Compression Wrap Compression Stockings Add-Ons Electronic Signature(s) [name], [name] ([id number]) [id number] Page 5 of 5 Signed: 03/29/2022 11:49:43 AM By: Erenest Blank Entered By: Erenest Blank on 03/29/2022 11:46:37 -------------------------------------------------------------------------------- Vitals Details Patient Name: Date of Service: Marissa [name]. 03/29/2022 11:30 A M Medical Record Number: [id number] Patient Account Number: [id number] Date of Birth/Sex: Treating RN: 11-Cardenas-[name] (72 y.o. F) Primary Care Vergie Zahm: [name], [name] Other Clinician: Referring Frankey Botting: Treating Dailin Sosnowski/Extender: Dalene Seltzer in Treatment: 12 Vital Signs Time Taken: 11:46 Reference Range: 80 - 120 mg / dl Height (in): 64 Weight (lbs): 97 Body Mass Index (BMI): 16.6 Electronic Signature(s) Signed: 03/29/2022 11:49:43 AM By: Erenest Blank Entered By: Erenest Blank on 03/29/2022 11:46:27

## 2022-03-29 NOTE — Progress Notes (Signed)
MITSY, OWEN R ([id number]) [id number] Page 1 of 1 Visit Report for 03/29/2022 SuperBill Details Patient Name: Date of Service: [name] 03/29/2022 Medical Record Number: [id number] Patient Account Number: [id number] Date of Birth/Sex: Treating RN: 27-Aug-1950 (72 y.o. F) Primary Care Provider: [name], [name] Other Clinician: Referring Provider: Treating Provider/Extender: Dalene Seltzer in Treatment: 12 Diagnosis Coding ICD-10 Codes Code Description M62.81 Muscle weakness (generalized) E43 Unspecified severe protein-calorie malnutrition L89.154 Pressure ulcer of sacral region, stage 4 Facility Procedures CPT4 Code Description Modifier Quantity [id number] [id number] - WOUND CARE VISIT-LEV 2 EST PT 1 Electronic Signature(s) Signed: 03/29/2022 11:49:43 AM By: Erenest Blank Signed: 03/29/2022 11:59:53 AM By: Kalman Shan DO Entered By: Erenest Blank on 03/29/2022 11:49:02

## 2022-04-01 ENCOUNTER — Encounter (HOSPITAL_BASED_OUTPATIENT_CLINIC_OR_DEPARTMENT_OTHER): Payer: 59 | Admitting: Internal Medicine

## 2022-04-01 DIAGNOSIS — Z681 Body mass index (BMI) 19 or less, adult: Secondary | ICD-10-CM | POA: Diagnosis not present

## 2022-04-01 DIAGNOSIS — L89153 Pressure ulcer of sacral region, stage 3: Secondary | ICD-10-CM | POA: Diagnosis not present

## 2022-04-01 DIAGNOSIS — R002 Palpitations: Secondary | ICD-10-CM | POA: Diagnosis not present

## 2022-04-01 DIAGNOSIS — R9431 Abnormal electrocardiogram [ECG] [EKG]: Secondary | ICD-10-CM | POA: Diagnosis not present

## 2022-04-01 DIAGNOSIS — E43 Unspecified severe protein-calorie malnutrition: Secondary | ICD-10-CM | POA: Diagnosis not present

## 2022-04-01 DIAGNOSIS — L89154 Pressure ulcer of sacral region, stage 4: Secondary | ICD-10-CM | POA: Diagnosis not present

## 2022-04-01 NOTE — Progress Notes (Signed)
Marissa, PARDY Cardenas ([id number]) [id number] Page 1 of 5 Visit Report for 04/01/2022 Arrival Information Details Patient Name: Date of Service: [name] 04/01/2022 12:30 PM Medical Record Number: [id number] Patient Account Number: [id number] Date of Birth/Sex: Treating RN: 30-May-1950 (72 y.o. F) Primary Care Adlai Nieblas: [name], [name] Other Clinician: Referring Wing Schoch: Treating Kendarrius Tanzi/Extender: Dalene Seltzer in Treatment: 12 Visit Information History Since Last Visit Added or deleted any medications: No Patient Arrived: Ambulatory Any new allergies or adverse reactions: No Arrival Time: 12:01 Had a fall or experienced change in No Accompanied By: self activities of daily living that may affect Transfer Assistance: None risk of falls: Patient Identification Verified: Yes Signs or symptoms of abuse/neglect since last visito No Secondary Verification Process Completed: Yes Hospitalized since last visit: No Patient Requires Transmission-Based Precautions: No Implantable device outside of the clinic excluding No Patient Has Alerts: No cellular tissue based products placed in the center since last visit: Has Dressing in Place as Prescribed: Yes Pain Present Now: No Electronic Signature(s) Signed: 04/01/2022 4:49:41 PM By: Erenest Blank Entered By: Erenest Blank on 04/01/2022 12:01:50 -------------------------------------------------------------------------------- Clinic Level of Care Assessment Details Patient Name: Date of Service: [name] 04/01/2022 12:30 PM Medical Record Number: [id number] Patient Account Number: [id number] Date of Birth/Sex: Treating RN: 20-Sep-1950 (72 y.o. F) Primary Care Embree Brawley: [name], [name] Other Clinician: Referring Kaysea Raya: Treating Kataleia Quaranta/Extender: Dalene Seltzer in Treatment: 12 Clinic Level of Care Assessment Items TOOL 4 Quantity  Score X- 1 0 Use when only an EandM is performed on FOLLOW-UP visit ASSESSMENTS - Nursing Assessment / Reassessment X- 1 10 Reassessment of Co-morbidities (includes updates in patient status) X- 1 5 Reassessment of Adherence to Treatment Plan ASSESSMENTS - Wound and Skin A ssessment / Reassessment X - Simple Wound Assessment / Reassessment - one wound 1 5 '[]'$  - 0 Complex Wound Assessment / Reassessment - multiple wounds '[]'$  - 0 Dermatologic / Skin Assessment (not related to wound area) ASSESSMENTS - Focused Assessment '[]'$  - 0 Circumferential Edema Measurements - multi extremities '[]'$  - 0 Nutritional Assessment / Counseling / Intervention Cardenas, [name] ([id number]) [phone].pdf Page 2 of 5 '[]'$  - 0 Lower Extremity Assessment (monofilament, tuning fork, pulses) '[]'$  - 0 Peripheral Arterial Disease Assessment (using hand held doppler) ASSESSMENTS - Ostomy and/or Continence Assessment and Care '[]'$  - 0 Incontinence Assessment and Management '[]'$  - 0 Ostomy Care Assessment and Management (repouching, etc.) PROCESS - Coordination of Care X - Simple Patient / Family Education for ongoing care 1 15 '[]'$  - 0 Complex (extensive) Patient / Family Education for ongoing care '[]'$  - 0 Staff obtains Programmer, systems, Records, T Results / Process Orders est '[]'$  - 0 Staff telephones HHA, Nursing Homes / Clarify orders / etc '[]'$  - 0 Routine Transfer to another Facility (non-emergent condition) '[]'$  - 0 Routine Hospital Admission (non-emergent condition) '[]'$  - 0 New Admissions / Biomedical engineer / Ordering NPWT Apligraf, etc. , '[]'$  - 0 Emergency Hospital Admission (emergent condition) X- 1 10 Simple Discharge Coordination '[]'$  - 0 Complex (extensive) Discharge Coordination PROCESS - Special Needs '[]'$  - 0 Pediatric / Minor Patient Management '[]'$  - 0 Isolation Patient Management '[]'$  - 0 Hearing / Language / Visual special needs '[]'$  - 0 Assessment of Community assistance  (transportation, D/C planning, etc.) '[]'$  - 0 Additional assistance / Altered mentation '[]'$  - 0 Support Surface(s) Assessment (bed, cushion, seat, etc.) INTERVENTIONS - Wound Cleansing / Measurement X - Simple Wound Cleansing -  one wound 1 5 '[]'$  - 0 Complex Wound Cleansing - multiple wounds '[]'$  - 0 Wound Imaging (photographs - any number of wounds) '[]'$  - 0 Wound Tracing (instead of photographs) '[]'$  - 0 Simple Wound Measurement - one wound '[]'$  - 0 Complex Wound Measurement - multiple wounds INTERVENTIONS - Wound Dressings X - Small Wound Dressing one or multiple wounds 1 10 '[]'$  - 0 Medium Wound Dressing one or multiple wounds '[]'$  - 0 Large Wound Dressing one or multiple wounds '[]'$  - 0 Application of Medications - topical '[]'$  - 0 Application of Medications - injection INTERVENTIONS - Miscellaneous '[]'$  - 0 External ear exam '[]'$  - 0 Specimen Collection (cultures, biopsies, blood, body fluids, etc.) '[]'$  - 0 Specimen(s) / Culture(s) sent or taken to Lab for analysis '[]'$  - 0 Patient Transfer (multiple staff / Civil Service fast streamer / Similar devices) '[]'$  - 0 Simple Staple / Suture removal (25 or less) '[]'$  - 0 Complex Staple / Suture removal (26 or more) '[]'$  - 0 Hypo / Hyperglycemic Management (close monitor of Blood Glucose) REBEKKAH, POWLESS Cardenas ([id number]) [id number].pdf Page 3 of 5 '[]'$  - 0 Ankle / Brachial Index (ABI) - do not check if billed separately '[]'$  - 0 Vital Signs Has the patient been seen at the hospital within the last three years: Yes Total Score: 60 Level Of Care: New/Established - Level 2 Electronic Signature(s) Signed: 04/01/2022 4:49:41 PM By: Erenest Blank Entered By: Erenest Blank on 04/01/2022 14:36:52 -------------------------------------------------------------------------------- Encounter Discharge Information Details Patient Name: Date of Service: [name]. 04/01/2022 12:30 PM Medical Record Number: [id number] Patient Account Number:  [id number] Date of Birth/Sex: Treating RN: 02-14-1951 (73 y.o. F) Primary Care Cataldo Cosgriff: [name], Eloy End Clinician: Erenest Blank Referring Okey Zelek: Treating Weslee Fogg/Extender: Genice Rouge., [name] in Treatment: 12 Encounter Discharge Information Items Discharge Condition: Stable Ambulatory Status: Ambulatory Discharge Destination: Home Transportation: Private Auto Accompanied By: self Schedule Follow-up Appointment: Yes Clinical Summary of Care: Electronic Signature(s) Signed: 04/01/2022 4:49:41 PM By: Erenest Blank Entered By: Erenest Blank on 04/01/2022 14:38:14 -------------------------------------------------------------------------------- Patient/Caregiver Education Details Patient Name: Date of Service: [name] 1/22/2024andnbsp12:30 PM Medical Record Number: [id number] Patient Account Number: [id number] Date of Birth/Gender: Treating RN: 18-Aug-1950 (72 y.o. F) Primary Care Physician: [name], [name] Other Clinician: Erenest Blank Referring Physician: Treating Physician/Extender: Genice Rouge., [name] in Treatment: 12 Education Assessment Education Provided To: Patient Education Topics Provided Electronic Signature(s) Signed: 04/01/2022 4:49:41 PM By: Erenest Blank Entered By: Erenest Blank on 04/01/2022 14:37:44 [name] ([id number]) [phone].pdf Page 4 of 5 -------------------------------------------------------------------------------- Wound Assessment Details Patient Name: Date of Service: [name] 04/01/2022 12:30 PM Medical Record Number: [id number] Patient Account Number: [id number] Date of Birth/Sex: Treating RN: 1950-08-12 (72 y.o. F) Primary Care Amor Hyle: [name], [name] Other Clinician: Referring Hildegard Hlavac: Treating Rowen Wilmer/Extender: Dalene Seltzer in Treatment: 12 Wound Status Wound Number: 1 Primary  Etiology: Pressure Ulcer Wound Location: Sacrum Wound Status: Open Wounding Event: Pressure Injury Date Acquired: 10/09/2021 Weeks Of Treatment: 12 Clustered Wound: No Wound Measurements Length: (cm) 0.4 Width: (cm) 0.2 Depth: (cm) 1 Area: (cm) 0.063 Volume: (cm) 0.063 % Reduction in Area: 82.2% % Reduction in Volume: 74.5% Wound Description Classification: Category/Stage IV Exudate Amount: Medium Exudate Type: Serosanguineous Exudate Color: red, brown Periwound Skin Texture Texture Color No Abnormalities Noted: No No Abnormalities Noted: No Moisture No Abnormalities Noted: No Treatment Notes Wound #1 (Sacrum) Cleanser Wound Cleanser Discharge Instruction: Cleanse the wound  with wound cleanser prior to applying a clean dressing using gauze sponges, not tissue or cotton balls. Peri-Wound Care Skin Prep Discharge Instruction: Use skin prep as directed Topical Primary Dressing Iodoform packing strip 1/4 (in) Discharge Instruction: Lightly pack as instructed. Please pack lightly into wound and all around the undermining as it is 4cm all around. This needs to be done 3 x a week. Secondary Dressing Zetuvit Plus Silicone Border Dressing 4x4 (in/in) Discharge Instruction: Apply silicone border over primary dressing as directed. Secured With Compression Wrap Compression Stockings Add-Ons Electronic Signature(s) ANNSLEE, TERCERO Cardenas ([id number]) [id number] Page 5 of 5 Signed: 04/01/2022 4:49:41 PM By: Erenest Blank Entered By: Erenest Blank on 04/01/2022 12:02:07 -------------------------------------------------------------------------------- Vitals Details Patient Name: Date of Service: [name] 04/01/2022 12:30 PM Medical Record Number: [id number] Patient Account Number: [id number] Date of Birth/Sex: Treating RN: 26-Nov-1950 (72 y.o. F) Primary Care Drey Shaff: [name], [name] Other Clinician: Referring Yitzel Shasteen: Treating  Audry Kauzlarich/Extender: Dalene Seltzer in Treatment: 12 Vital Signs Time Taken: 12:01 Reference Range: 80 - 120 mg / dl Height (in): 64 Weight (lbs): 97 Body Mass Index (BMI): 16.6 Electronic Signature(s) Signed: 04/01/2022 4:49:41 PM By: Erenest Blank Entered By: Erenest Blank on 04/01/2022 12:01:57

## 2022-04-02 NOTE — Progress Notes (Signed)
ROSAURA, BOLON R ([id number]) [id number] Page 1 of 1 Visit Report for 04/01/2022 SuperBill Details Patient Name: Date of Service: [name] 04/01/2022 Medical Record Number: [id number] Patient Account Number: [id number] Date of Birth/Sex: Treating RN: 1950-07-05 (72 y.o. F) Primary Care Provider: [name], [name] Other Clinician: Referring Provider: Treating Provider/Extender: Dalene Seltzer in Treatment: 12 Diagnosis Coding ICD-10 Codes Code Description M62.81 Muscle weakness (generalized) E43 Unspecified severe protein-calorie malnutrition L89.154 Pressure ulcer of sacral region, stage 4 Facility Procedures CPT4 Code Description Modifier Quantity [id number] - WOUND CARE VISIT-LEV 2 EST PT 1 Electronic Signature(s) Signed: 04/01/2022 4:49:41 PM By: Erenest Blank Signed: 04/02/2022 10:49:17 AM By: Kalman Shan DO Entered By: Erenest Blank on 04/01/2022 14:38:47

## 2022-04-03 ENCOUNTER — Encounter (HOSPITAL_BASED_OUTPATIENT_CLINIC_OR_DEPARTMENT_OTHER): Payer: 59 | Admitting: Physician Assistant

## 2022-04-03 DIAGNOSIS — L89154 Pressure ulcer of sacral region, stage 4: Secondary | ICD-10-CM | POA: Diagnosis not present

## 2022-04-03 DIAGNOSIS — E43 Unspecified severe protein-calorie malnutrition: Secondary | ICD-10-CM | POA: Diagnosis not present

## 2022-04-03 DIAGNOSIS — L89153 Pressure ulcer of sacral region, stage 3: Secondary | ICD-10-CM | POA: Diagnosis not present

## 2022-04-03 NOTE — Progress Notes (Signed)
Marissa, DEPACE Cardenas ([id number]) [id number] Page 1 of 7 Visit Report for 04/03/2022 Arrival Information Details Patient Name: Date of Service: [name] 04/03/2022 1:15 PM Medical Record Number: [id number] Patient Account Number: [id number] Date of Birth/Sex: Treating RN: Dec 06, 1950 (72 y.o. F) Primary Care Arietta Eisenstein: [name], [name] Other Clinician: Referring Mahlia Fernando: Treating Jaskarn Schweer/Extender: [name] in Treatment: 13 Visit Information History Since Last Visit Added or deleted any medications: No Patient Arrived: Ambulatory Any new allergies or adverse reactions: No Arrival Time: 13:05 Had a fall or experienced change in No Accompanied By: self activities of daily living that may affect Transfer Assistance: None risk of falls: Patient Identification Verified: Yes Signs or symptoms of abuse/neglect since last visito No Secondary Verification Process Completed: Yes Hospitalized since last visit: No Patient Requires Transmission-Based Precautions: No Implantable device outside of the clinic excluding No Patient Has Alerts: No cellular tissue based products placed in the center since last visit: Has Dressing in Place as Prescribed: No Pain Present Now: No Electronic Signature(s) Signed: 04/03/2022 4:38:50 PM By: Erenest Blank Entered By: Erenest Blank on 04/03/2022 13:35:20 -------------------------------------------------------------------------------- Clinic Level of Care Assessment Details Patient Name: Date of Service: [name] 04/03/2022 1:15 PM Medical Record Number: [id number] Patient Account Number: [id number] Date of Birth/Sex: Treating RN: [birth year] (72 y.o. [name], Marissa Primary Care Azarria Balint: [name], [name] Other Clinician: Referring Kierah Goatley: Treating Mauria Asquith/Extender: [name] in Treatment: 13 Clinic Level of Care Assessment Items TOOL  4 Quantity Score X- 1 0 Use when only an EandM is performed on FOLLOW-UP visit ASSESSMENTS - Nursing Assessment / Reassessment X- 1 10 Reassessment of Co-morbidities (includes updates in patient status) X- 1 5 Reassessment of Adherence to Treatment Plan ASSESSMENTS - Wound and Skin A ssessment / Reassessment X - Simple Wound Assessment / Reassessment - one wound 1 5 '[]'$  - 0 Complex Wound Assessment / Reassessment - multiple wounds '[]'$  - 0 Dermatologic / Skin Assessment (not related to wound area) ASSESSMENTS - Focused Assessment '[]'$  - 0 Circumferential Edema Measurements - multi extremities '[]'$  - 0 Nutritional Assessment / Counseling / Intervention GRAVIELA, NODAL Cardenas ([id number]) [phone].pdf Page 2 of 7 '[]'$  - 0 Lower Extremity Assessment (monofilament, tuning fork, pulses) '[]'$  - 0 Peripheral Arterial Disease Assessment (using hand held doppler) ASSESSMENTS - Ostomy and/or Continence Assessment and Care '[]'$  - 0 Incontinence Assessment and Management '[]'$  - 0 Ostomy Care Assessment and Management (repouching, etc.) PROCESS - Coordination of Care X - Simple Patient / Family Education for ongoing care 1 15 '[]'$  - 0 Complex (extensive) Patient / Family Education for ongoing care X- 1 10 Staff obtains Programmer, systems, Records, T Results / Process Orders est X- 1 10 Staff telephones HHA, Nursing Homes / Clarify orders / etc '[]'$  - 0 Routine Transfer to another Facility (non-emergent condition) '[]'$  - 0 Routine Hospital Admission (non-emergent condition) '[]'$  - 0 New Admissions / Biomedical engineer / Ordering NPWT Apligraf, etc. , '[]'$  - 0 Emergency Hospital Admission (emergent condition) X- 1 10 Simple Discharge Coordination '[]'$  - 0 Complex (extensive) Discharge Coordination PROCESS - Special Needs '[]'$  - 0 Pediatric / Minor Patient Management '[]'$  - 0 Isolation Patient Management '[]'$  - 0 Hearing / Language / Visual special needs '[]'$  - 0 Assessment of Community  assistance (transportation, D/C planning, etc.) '[]'$  - 0 Additional assistance / Altered mentation '[]'$  - 0 Support Surface(s) Assessment (bed, cushion, seat, etc.) INTERVENTIONS - Wound Cleansing / Measurement X -  Simple Wound Cleansing - one wound 1 5 '[]'$  - 0 Complex Wound Cleansing - multiple wounds X- 1 5 Wound Imaging (photographs - any number of wounds) '[]'$  - 0 Wound Tracing (instead of photographs) X- 1 5 Simple Wound Measurement - one wound '[]'$  - 0 Complex Wound Measurement - multiple wounds INTERVENTIONS - Wound Dressings X - Small Wound Dressing one or multiple wounds 1 10 '[]'$  - 0 Medium Wound Dressing one or multiple wounds '[]'$  - 0 Large Wound Dressing one or multiple wounds X- 1 5 Application of Medications - topical '[]'$  - 0 Application of Medications - injection INTERVENTIONS - Miscellaneous '[]'$  - 0 External ear exam '[]'$  - 0 Specimen Collection (cultures, biopsies, blood, body fluids, etc.) '[]'$  - 0 Specimen(s) / Culture(s) sent or taken to Lab for analysis '[]'$  - 0 Patient Transfer (multiple staff / Civil Service fast streamer / Similar devices) '[]'$  - 0 Simple Staple / Suture removal (25 or less) '[]'$  - 0 Complex Staple / Suture removal (26 or more) '[]'$  - 0 Hypo / Hyperglycemic Management (close monitor of Blood Glucose) MIOSOTIS, WETSEL Cardenas ([id number]) [phone].pdf Page 3 of 7 '[]'$  - 0 Ankle / Brachial Index (ABI) - do not check if billed separately X- 1 5 Vital Signs Has the patient been seen at the hospital within the last three years: Yes Total Score: 100 Level Of Care: New/Established - Level 3 Electronic Signature(s) Signed: 04/03/2022 4:09:44 PM By: [name] Entered By: [name] on 04/03/2022 13:35:29 -------------------------------------------------------------------------------- Encounter Discharge Information Details Patient Name: Date of Service: [name] 04/03/2022 1:15 PM Medical Record Number: [id number] Patient  Account Number: [id number] Date of Birth/Sex: Treating RN: 05-17-1950 (72 y.o. [name], Marissa Primary Care Yamna Mackel: [name], [name] Other Clinician: Referring Ori Kreiter: Treating Jennefer Kopp/Extender: [name] in Treatment: 13 Encounter Discharge Information Items Discharge Condition: Stable Ambulatory Status: Ambulatory Discharge Destination: Home Transportation: Private Auto Accompanied By: self Schedule Follow-up Appointment: Yes Clinical Summary of Care: Patient Declined Electronic Signature(s) Signed: 04/03/2022 4:09:44 PM By: [name] Entered By: [name] on 04/03/2022 13:35:59 -------------------------------------------------------------------------------- Lower Extremity Assessment Details Patient Name: Date of Service: [name] 04/03/2022 1:15 PM Medical Record Number: [id number] Patient Account Number: [id number] Date of Birth/Sex: Treating RN: 22-Aug-1950 (72 y.o. F) Primary Care Lional Icenogle: [name], [name] Other Clinician: Referring Pahola Dimmitt: Treating Jacquiline Zurcher/Extender: [name] in Treatment: 13 Electronic Signature(s) Signed: 04/03/2022 4:38:50 PM By: Erenest Blank Entered By: Erenest Blank on 04/03/2022 13:06:47 -------------------------------------------------------------------------------- Multi-Disciplinary Care Plan Details Patient Name: Date of Service: [name]. 04/03/2022 1:15 PM Medical Record Number: [id number] Patient Account Number: [id number] ENYLA, LISBON ([id number]) [phone].pdf Page 4 of 7 Date of Birth/Sex: Treating RN: July 01, 1950 (72 y.o. [name], Marissa Primary Care Ambreen Tufte: Other Clinician: [name], [name] Referring Jakhari Space: Treating Sheera Illingworth/Extender: [name] in Treatment: 13 Active Inactive Pain, Acute or Chronic Nursing Diagnoses: Pain, acute or chronic:  actual or potential Potential alteration in comfort, pain Goals: Patient will verbalize adequate pain control and receive pain control interventions during procedures as needed Date Initiated: 01/02/2022 Target Resolution Date: 04/13/2022 Goal Status: Active Patient/caregiver will verbalize adequate pain control between visits Date Initiated: 01/02/2022 Target Resolution Date: 04/13/2022 Goal Status: Active Interventions: Encourage patient to take pain medications as prescribed Provide education on pain management Reposition patient for comfort Treatment Activities: Administer pain control measures as ordered : 01/02/2022 Notes: Pressure Nursing Diagnoses: Knowledge  deficit related to management of pressures ulcers Goals: Patient/caregiver will verbalize risk factors for pressure ulcer development Date Initiated: 01/02/2022 Target Resolution Date: 04/13/2022 Goal Status: Active Interventions: Assess: immobility, friction, shearing, incontinence upon admission and as needed Assess offloading mechanisms upon admission and as needed Provide education on pressure ulcers Treatment Activities: T ordered outside of clinic : 01/02/2022 est Notes: Electronic Signature(s) Signed: 04/03/2022 4:09:44 PM By: [name] Entered By: [name] on 04/03/2022 13:17:18 -------------------------------------------------------------------------------- Pain Assessment Details Patient Name: Date of Service: [name] 04/03/2022 1:15 PM Medical Record Number: [id number] Patient Account Number: [id number] Date of Birth/Sex: Treating RN: 1950-05-26 (72 y.o. F) Primary Care Tyrika Newman: [name], [name] Other Clinician: Referring Taelor Waymire: Treating Tiersa Dayley/Extender: [name] in Treatment: [address] CAMAYA, GANNETT Cardenas ([id number]) [id number] Page 5 of 7 Location of Pain Severity and Description of Pain Patient  Has Paino No Site Locations Pain Management and Medication Current Pain Management: Electronic Signature(s) Signed: 04/03/2022 4:38:50 PM By: Erenest Blank Entered By: Erenest Blank on 04/03/2022 13:06:40 -------------------------------------------------------------------------------- Patient/Caregiver Education Details Patient Name: Date of Service: [name] 1/24/2024andnbsp1:15 PM Medical Record Number: [id number] Patient Account Number: [id number] Date of Birth/Gender: Treating RN: [birth year] (72 y.o. [name], Marissa Primary Care Physician: [name], [name] Other Clinician: Referring Physician: Treating Physician/Extender: [name] in Treatment: 13 Education Assessment Education Provided To: Patient Education Topics Provided Wound/Skin Impairment: Methods: Explain/Verbal Responses: Reinforcements needed, State content correctly [employer]) Signed: 04/03/2022 4:09:44 PM By: [name] Entered By: [name] on 04/03/2022 13:18:00 -------------------------------------------------------------------------------- Wound Assessment Details Patient Name: Date of Service: [name]. 04/03/2022 1:15 PM HARLEE, ECKROTH Cardenas [phone] Page 6 of 7 Medical Record Number: [id number] Patient Account Number: [id number] Date of Birth/Sex: Treating RN: May 25, 1950 (72 y.o. F) Primary Care Senie Lanese: [name], [name] Other Clinician: Referring Sister Carbone: Treating Zamariyah Furukawa/Extender: [name] in Treatment: 13 Wound Status Wound Number: 1 Primary Etiology: Pressure Ulcer Wound Location: Sacrum Wound Status: Open Wounding Event: Pressure Injury Comorbid History: Colitis Date Acquired: 10/09/2021 Weeks Of Treatment: 13 Clustered Wound: No Photos Wound Measurements Length: (cm) 0.3 Width: (cm) 0.2 Depth: (cm) 0.5 Area: (cm)  0.047 Volume: (cm) 0.024 % Reduction in Area: 86.7% % Reduction in Volume: 90.3% Tunneling: No Undermining: No Wound Description Classification: Category/Stage IV Exudate Amount: Medium Exudate Type: Serosanguineous Exudate Color: red, brown Wound Bed Granulation Amount: Large (67-100%) Necrotic Amount: None Present (0%) Periwound Skin Texture Texture Color No Abnormalities Noted: No No Abnormalities Noted: No Moisture No Abnormalities Noted: No Treatment Notes Wound #1 (Sacrum) Cleanser Wound Cleanser Discharge Instruction: Cleanse the wound with wound cleanser prior to applying a clean dressing using gauze sponges, not tissue or cotton balls. Peri-Wound Care Skin Prep Discharge Instruction: Use skin prep as directed Topical Primary Dressing Iodoform packing strip 1/4 (in) Discharge Instruction: Lightly pack as instructed. Please pack lightly into wound and all around the undermining as it is 4cm all around. This needs to be done 3 x a week. Secondary Dressing Zetuvit Plus Silicone Border Dressing 4x4 (in/in) DOMNIQUE, VANEGAS Cardenas ([id number]) [id number] Page 7 of 7 Discharge Instruction: Apply silicone border over primary dressing as directed. Secured With Compression Wrap Compression Stockings Environmental education officer) Signed: 04/03/2022 4:38:50 PM By: Erenest Blank Entered By: Erenest Blank on 04/03/2022 13:13:14 -------------------------------------------------------------------------------- Vitals Details Patient Name: Date of Service: [name], Gaylord Cardenas. 04/03/2022 1:15 PM Medical Record  Number: [id number] Patient Account Number: [id number] Date of Birth/Sex: Treating RN: 1951/02/25 (72 y.o. F) Primary Care Fynn Vanblarcom: [name], [name] Other Clinician: Referring Dalante Minus: Treating Keiland Pickering/Extender: [name] in Treatment: 13 Vital Signs Time Taken: 13:06 Temperature (F): 98.5 Height (in):  64 Pulse (bpm): 75 Weight (lbs): 97 Respiratory Rate (breaths/min): 17 Body Mass Index (BMI): 16.6 Blood Pressure (mmHg): 111/70 Reference Range: 80 - 120 mg / dl Electronic Signature(s) Signed: 04/03/2022 4:38:50 PM By: Erenest Blank Entered By: Erenest Blank on 04/03/2022 13:06:35

## 2022-04-04 DIAGNOSIS — J449 Chronic obstructive pulmonary disease, unspecified: Secondary | ICD-10-CM | POA: Diagnosis not present

## 2022-04-04 DIAGNOSIS — F17210 Nicotine dependence, cigarettes, uncomplicated: Secondary | ICD-10-CM | POA: Diagnosis not present

## 2022-04-04 NOTE — Progress Notes (Signed)
KENDA, KLOEHN R ([id number]) [id number] Page 1 of 6 Visit Report for 04/03/2022 Chief Complaint Document Details Patient Name: Date of Service: [name] 04/03/2022 1:15 PM Medical Record Number: [id number] Patient Account Number: [id number] Date of Birth/Sex: Treating RN: 1950-10-08 (72 y.o. F) Primary Care Provider: [name], [name] Other Clinician: Referring Provider: Treating Provider/Extender: [name] in Treatment: 13 Information Obtained from: Patient Chief Complaint Pressure ulcer stage 3 Sacrum Electronic Signature(s) Signed: 04/03/2022 2:00:59 PM By: Worthy Keeler PA-C Entered By: Worthy Keeler on 04/03/2022 14:00:59 -------------------------------------------------------------------------------- HPI Details Patient Name: Date of Service: Marissa [name] 04/03/2022 1:15 PM Medical Record Number: [id number] Patient Account Number: [id number] Date of Birth/Sex: Treating RN: 12-19-1950 (72 y.o. F) Primary Care Provider: [name], [name] Other Clinician: Referring Provider: Treating Provider/Extender: [name] in Treatment: 13 History of Present Illness HPI Description: 01-02-22 upon evaluation today patient presents for initial inspection here in our clinic concerning a wound that she has over the sacral region. This is stated to be present since around the beginning of August 2023. She currently resides in a assisted nursing facility. She does seem to be able to answer questions fully today. Subsequently I do note that she is a little on the thin side but again other than the protein calorie malnutrition she is minimally weak but still does get up and move around some but she also tells me that she "sits a lot". She has not had any x-rays of the sacral region at this point. 01-09-2022 upon evaluation today patient's wound in the sacral area actually appears to be  doing decently well. Fortunately I do not see any signs of infection which is great news and overall I am extremely pleased with where things stand today. 11/8; this is a patient who lives in some form of small assisted living in [place]. She has a deep wound over the lower part of her coccyx. An x-ray that we ordered from last time did not show any osseous abnormalities. We are supposed to be using Hydrofera Blue in the wound but I am really not sure what they are using to dress this and who is doing it. Talking to our staff they have apparently discussed this with the staff in the facility. She does not have home health. 01-23-2022 upon evaluation today patient appears to be doing decently well in regard to her wound. She has been tolerating the dressing changes without complication although I am not certain the dressing changes have been done appropriately over the past couple of weeks. We have been trying to get her to come in here we also try to get her into a wound care center in [place] which would be closer for the assisted living facility. Unfortunately neither 1 of those were undertaken up to this point. The patient did end up having some training of the staff from an RN that they brought him to have the staff perform the dressing changes but that being said it does not sound like this has been done correctly. Nonetheless I do believe that based on what we see it would benefit the patient to actually have her come here 3 times a week also think a wound VAC could potentially be a possibility for her which would help to get things moving in a much better direction as far as healing is concerned. We need to get this to fill-in though it looks clean and  does not look infected I do think that we need to really get this to fill-in and there is quite a bit of undermining that is to be considered here. 01-30-2022 upon evaluation today patient appears to be doing well currently in regard to her wound  which is looking pretty decent but still has quite a bit of space underneath as far as undermining is concerned. I think she might possibly be better with a wound VAC. With that being said if when I do this we probably only be able to do it 2 times a week at most. I discussed that with the patient today she is okay with that we just need to see if we can get the insurance approval and then of course the scheduling underway. 02-13-2022 upon evaluation today patient appears to be doing well currently in regard to her wound. She actually tells me that it is feeling a lot better which is good news. Fortunately I do not see any signs of active infection at this time. No fevers, chills, nausea, vomiting, or diarrhea. 02-27-2022 upon evaluation today patient appears to be doing well currently in regard to her wound. She is actually showing some signs of improvement we are [name], Marissa R ([id number]) [id number] Page 2 of 6 still obtaining the approval for the snap VAC which I think could be beneficial in the meantime we been using the Hydrofera Blue rope. 03-27-2022 upon evaluation today patient appears to be doing okay in regard to her wound but there was no dressing in place upon arrival today. Fortunately I see again no evidence of infection. No fevers, chills, nausea, vomiting, or diarrhea. 04-03-2022 upon evaluation today patient did not have a dressing on when she came into the office. With that being said she actually tells me that it was on until yesterday she took a shower and it came loose and then today came off completely. With that being said I really do not think she needs to be taking shower every single day or having to see her here in the clinic 3 times per week on Monday, Wednesday, and Friday and during those times obviously in between she needs to probably avoid showering. For that reason what I advised her to do would be to shower in the mornings on the days  that she comes to see [place] that way if the dressing does come off or start to come off we will be changing it anyway. She voiced understanding and tells me she can do that. Unfortunately the facility has nobody to help her with changing the dressings and we are not currently having to do that here in the clinic again. This means that during the time that she was not coming in from December 20 through when I saw her last week on the 17th there was a period of 2 weeks where the wound really was not changed at all as the RN that Myriam Jacobson tell me they had at the facility did not end up working out. This obviously is not good and is not what we expect to see as far as patient care is concerned which is why we are now seeing her 3 times a week here in the clinic. Electronic Signature(s) Signed: 04/03/2022 5:05:56 PM By: Worthy Keeler PA-C Entered By: Worthy Keeler on 04/03/2022 17:05:56 -------------------------------------------------------------------------------- Physical Exam Details Patient Name: Date of Service: [name] 04/03/2022 1:15 PM Medical Record Number: [id number] Patient Account Number: [id number] Date of Birth/Sex: Treating  RN: Nov 02, 1950 (72 y.o. F) Primary Care Provider: [name], [name] Other Clinician: Referring Provider: Treating Provider/Extender: [name] in Treatment: 63 Constitutional Well-nourished and well-hydrated in no acute distress. Respiratory normal breathing without difficulty. Psychiatric this patient is able to make decisions and demonstrates good insight into disease process. Alert and Oriented x 3. pleasant and cooperative. Notes Upon inspection patient's wound bed actually showed signs of doing well and not showing any evidence of infection right now but she still does have quite a bit of undermining unfortunately. This means that we are definitely going to need to continue with the packing and I think  quarter-inch gauze iodoform gauze that is should be appropriate. Electronic Signature(s) Signed: 04/03/2022 5:06:18 PM By: Worthy Keeler PA-C Entered By: Worthy Keeler on 04/03/2022 17:06:17 -------------------------------------------------------------------------------- Physician Orders Details Patient Name: Date of Service: Marissa [name] 04/03/2022 1:15 PM Medical Record Number: [id number] Patient Account Number: [id number] Date of Birth/Sex: Treating RN: 1950/11/16 (72 y.o. [name], [name] Primary Care Provider: [name], [name] Other Clinician: Referring Provider: Treating Provider/Extender: [name] in Treatment: [phone] Verbal / Phone Orders: No Diagnosis Coding Follow-up Appointments ppointment in 1 week. Burman Blacksmith on WEdnesday 04/10/22 @ 1:15 Rm # 7 (already made a spot in ihea- needs to be scheduled in epic) Return A [name], Marissa R ([id number]) [phone].pdf Page 3 of 6 Nurse Visit: - every MOnday and Friday Other: - Administrator Arbutus Ped) : [phone] ***Facility to change Monday, WEdnesday, FRiday*** ***If coming to facility weekly, it's okay to let [facility] change on Wednesday*** Anesthetic (In clinic) Topical Lidocaine 5% applied to wound bed Off-Loading Turn and reposition every 2 hours [facility] to [facility] for skilled nursing wound care. May utilize formulary equivalent dressing for wound treatment orders unless otherwise specified. - ADmit to [facility]. Home health to change Monday's and Friday's, wound care center to change Wednesday's. Wound Treatment Wound #1 - Sacrum Cleanser: Wound Cleanser (Home Health) 3 x Per Week/30 Days Discharge Instructions: Cleanse the wound with wound cleanser prior to applying a clean dressing using gauze sponges, not tissue or cotton balls. Peri-Wound Care: Skin Prep (Home Health) 3 x Per Week/30 Days Discharge Instructions: Use skin  prep as directed Prim Dressing: Iodoform packing strip 1/4 (in) 3 x Per Week/30 Days ary Discharge Instructions: Lightly pack as instructed. Please pack lightly into wound and all around the undermining as it is 4cm all around. This needs to be done 3 x a week. Secondary Dressing: Zetuvit Plus Silicone Border Dressing 4x4 (in/in) (Home Health) 3 x Per Week/30 Days Discharge Instructions: Apply silicone border over primary dressing as directed. Electronic Signature(s) Signed: 04/03/2022 4:09:44 PM By: [name] Signed: 04/03/2022 5:48:31 PM By: Worthy Keeler PA-C Entered By: [name] on 04/03/2022 13:34:32 -------------------------------------------------------------------------------- Problem List Details Patient Name: Date of Service: Marissa [name] 04/03/2022 1:15 PM Medical Record Number: [id number] Patient Account Number: [id number] Date of Birth/Sex: Treating RN: 16-Dec-1950 (72 y.o. F) Primary Care Provider: [name], [name] Other Clinician: Referring Provider: Treating Provider/Extender: [name] in Treatment: 13 Active Problems ICD-10 Encounter Code Description Active Date MDM Diagnosis M62.81 Muscle weakness (generalized) 01/02/2022 No Yes E43 Unspecified severe protein-calorie malnutrition 01/02/2022 No Yes L89.154 Pressure ulcer of sacral region, stage 4 01/02/2022 No Yes Inactive Problems Resolved Problems [name], Marissa R ([id number]) [id number] Page 4 of 6 Electronic Signature(s) Signed: 04/03/2022  2:00:48 PM By: Worthy Keeler PA-C Entered By: Worthy Keeler on 04/03/2022 14:00:48 -------------------------------------------------------------------------------- Progress Note Details Patient Name: Date of Service: [name] 04/03/2022 1:15 PM Medical Record Number: [id number] Patient Account Number: [id number] Date of Birth/Sex: Treating RN: 09/23/1950 (72 y.o.  F) Primary Care Provider: [name], [name] Other Clinician: Referring Provider: Treating Provider/Extender: [name] in Treatment: 13 Subjective Chief Complaint Information obtained from Patient Pressure ulcer stage 3 Sacrum History of Present Illness (HPI) 01-02-22 upon evaluation today patient presents for initial inspection here in our clinic concerning a wound that she has over the sacral region. This is stated to be present since around the beginning of August 2023. She currently resides in a assisted nursing facility. She does seem to be able to answer questions fully today. Subsequently I do note that she is a little on the thin side but again other than the protein calorie malnutrition she is minimally weak but still does get up and move around some but she also tells me that she "sits a lot". She has not had any x-rays of the sacral region at this point. 01-09-2022 upon evaluation today patient's wound in the sacral area actually appears to be doing decently well. Fortunately I do not see any signs of infection which is great news and overall I am extremely pleased with where things stand today. 11/8; this is a patient who lives in some form of small assisted living in [place]. She has a deep wound over the lower part of her coccyx. An x-ray that we ordered from last time did not show any osseous abnormalities. We are supposed to be using Hydrofera Blue in the wound but I am really not sure what they are using to dress this and who is doing it. Talking to our staff they have apparently discussed this with the staff in the facility. She does not have home health. 01-23-2022 upon evaluation today patient appears to be doing decently well in regard to her wound. She has been tolerating the dressing changes without complication although I am not certain the dressing changes have been done appropriately over the past couple of weeks. We have been trying  to get her to come in here we also try to get her into a wound care center in [place] which would be closer for the assisted living facility. Unfortunately neither 1 of those were undertaken up to this point. The patient did end up having some training of the staff from an RN that they brought him to have the staff perform the dressing changes but that being said it does not sound like this has been done correctly. Nonetheless I do believe that based on what we see it would benefit the patient to actually have her come here 3 times a week also think a wound VAC could potentially be a possibility for her which would help to get things moving in a much better direction as far as healing is concerned. We need to get this to fill-in though it looks clean and does not look infected I do think that we need to really get this to fill-in and there is quite a bit of undermining that is to be considered here. 01-30-2022 upon evaluation today patient appears to be doing well currently in regard to her wound which is looking pretty decent but still has quite a bit of space underneath as far as undermining is concerned. I think she might possibly  be better with a wound VAC. With that being said if when I do this we probably only be able to do it 2 times a week at most. I discussed that with the patient today she is okay with that we just need to see if we can get the insurance approval and then of course the scheduling underway. 02-13-2022 upon evaluation today patient appears to be doing well currently in regard to her wound. She actually tells me that it is feeling a lot better which is good news. Fortunately I do not see any signs of active infection at this time. No fevers, chills, nausea, vomiting, or diarrhea. 02-27-2022 upon evaluation today patient appears to be doing well currently in regard to her wound. She is actually showing some signs of improvement we are still obtaining the approval for the snap VAC  which I think could be beneficial in the meantime we been using the Hydrofera Blue rope. 03-27-2022 upon evaluation today patient appears to be doing okay in regard to her wound but there was no dressing in place upon arrival today. Fortunately I see again no evidence of infection. No fevers, chills, nausea, vomiting, or diarrhea. 04-03-2022 upon evaluation today patient did not have a dressing on when she came into the office. With that being said she actually tells me that it was on until yesterday she took a shower and it came loose and then today came off completely. With that being said I really do not think she needs to be taking shower every single day or having to see her here in the clinic 3 times per week on Monday, Wednesday, and Friday and during those times obviously in between she needs to probably avoid showering. For that reason what I advised her to do would be to shower in the mornings on the days that she comes to see [place] that way if the dressing does come off or start to come off we will be changing it anyway. She voiced understanding and tells me she can do that. Unfortunately the facility has nobody to help her with changing the dressings and we are not currently having to do that here in the clinic again. This means that during the time that she was not coming in from December 20 through when I saw her last week on the 17th there was a period of 2 weeks where the wound really was not changed at all as the RN that Myriam Jacobson tell me they had at the facility did not end up working out. This obviously is not good and is not what we expect to see as far as patient care is concerned which is why we are now seeing her 3 times a week here in the clinic. [address] [name], Marissa R ([id number]) [id number] Page 5 of 6 Constitutional Well-nourished and well-hydrated in no acute distress. Vitals Time Taken: 1:06 PM, Height: 64 in, Weight: 97 lbs, BMI: 16.6,  Temperature: 98.5 F, Pulse: 75 bpm, Respiratory Rate: 17 breaths/min, Blood Pressure: 111/70 mmHg. Respiratory normal breathing without difficulty. Psychiatric this patient is able to make decisions and demonstrates good insight into disease process. Alert and Oriented x 3. pleasant and cooperative. General Notes: Upon inspection patient's wound bed actually showed signs of doing well and not showing any evidence of infection right now but she still does have quite a bit of undermining unfortunately. This means that we are definitely going to need to continue with the packing and I think quarter-inch gauze iodoform gauze that  is should be appropriate. Integumentary (Hair, Skin) Wound #1 status is Open. Original cause of wound was Pressure Injury. The date acquired was: 10/09/2021. The wound has been in treatment 13 weeks. The wound is located on the Sacrum. The wound measures 0.3cm length x 0.2cm width x 0.5cm depth; 0.047cm^2 area and 0.024cm^3 volume. There is no tunneling or undermining noted. There is a medium amount of serosanguineous drainage noted. There is large (67-100%) granulation within the wound bed. There is no necrotic tissue within the wound bed. Assessment Active Problems ICD-10 Muscle weakness (generalized) Unspecified severe protein-calorie malnutrition Pressure ulcer of sacral region, stage 4 Plan Follow-up Appointments: Return Appointment in 1 week. Burman Blacksmith on WEdnesday 04/10/22 @ 1:15 Rm # 7 (already made a spot in ihea- needs to be scheduled in epic) Nurse Visit: - every MOnday and Friday Other: - Administrator Arbutus Ped) : [phone] ***Facility to change Monday, WEdnesday, FRiday*** ***If coming to facility weekly, it's okay to let [facility] change on Wednesday*** Anesthetic: (In clinic) Topical Lidocaine 5% applied to wound bed Off-Loading: Turn and reposition every 2 hours Home Health: Admit to [facility] for skilled nursing wound care. May utilize  formulary equivalent dressing for wound treatment orders unless otherwise specified. - ADmit to [facility]. Home health to change Monday's and Friday's, wound care center to change Wednesday's. WOUND #1: - Sacrum Wound Laterality: Cleanser: Wound Cleanser (Home Health) 3 x Per Week/30 Days Discharge Instructions: Cleanse the wound with wound cleanser prior to applying a clean dressing using gauze sponges, not tissue or cotton balls. Peri-Wound Care: Skin Prep (Home Health) 3 x Per Week/30 Days Discharge Instructions: Use skin prep as directed Prim Dressing: Iodoform packing strip 1/4 (in) 3 x Per Week/30 Days ary Discharge Instructions: Lightly pack as instructed. Please pack lightly into wound and all around the undermining as it is 4cm all around. This needs to be done 3 x a week. Secondary Dressing: Zetuvit Plus Silicone Border Dressing 4x4 (in/in) (Home Health) 3 x Per Week/30 Days Discharge Instructions: Apply silicone border over primary dressing as directed. 1. I would recommend currently that we going pack this 3 times per week with 1/4 inch iodoform gauze which I think is doing well for the patient. 2. I am also can recommend that the patient should continue to monitor for any signs of infection or worsening. Based on what I am seeing I do believe that the wound is doing a little better but at the same time we still need to make sure that this is packed appropriately. 3. I am also can recommend that we continue with the Zetuvit bordered foam dressing to cover and again this is changed 3 times per week now here in our office. We will see patient back for reevaluation in 1 week here in the clinic. If anything worsens or changes patient will contact our office for additional recommendations. Electronic Signature(s) Signed: 04/03/2022 5:07:11 PM By: Worthy Keeler PA-C Entered By: Worthy Keeler on 04/03/2022 17:07:11 [name] ([id number]) [id number]  Page 6 of 6 -------------------------------------------------------------------------------- SuperBill Details Patient Name: Date of Service: [name] 04/03/2022 Medical Record Number: [id number] Patient Account Number: [id number] Date of Birth/Sex: Treating RN: Jul 30, 1950 (72 y.o. [name], [name] Primary Care Provider: [name], [name] Other Clinician: Referring Provider: Treating Provider/Extender: [name] in Treatment: 13 Diagnosis Coding ICD-10 Codes Code Description M62.81 Muscle weakness (generalized) E43 Unspecified severe protein-calorie malnutrition L89.154 Pressure ulcer of sacral  region, stage 4 Facility Procedures : CPT4 Code: [id number] Description: 99213 - WOUND CARE VISIT-LEV 3 EST PT Modifier: Quantity: 1 Physician Procedures : CPT4 Code Description Modifier [id number] - WC PHYS LEVEL 3 - EST PT ICD-10 Diagnosis Description M62.81 Muscle weakness (generalized) E43 Unspecified severe protein-calorie malnutrition L89.154 Pressure ulcer of sacral region, stage 4 Quantity: 1 Electronic Signature(s) Signed: 04/03/2022 5:07:37 PM By: Worthy Keeler PA-C Previous Signature: 04/03/2022 4:09:44 PM Version By: [name] Entered By: Worthy Keeler on 04/03/2022 17:07:37

## 2022-04-05 ENCOUNTER — Encounter (HOSPITAL_BASED_OUTPATIENT_CLINIC_OR_DEPARTMENT_OTHER): Payer: 59 | Admitting: Internal Medicine

## 2022-04-05 DIAGNOSIS — E43 Unspecified severe protein-calorie malnutrition: Secondary | ICD-10-CM | POA: Diagnosis not present

## 2022-04-05 DIAGNOSIS — L89153 Pressure ulcer of sacral region, stage 3: Secondary | ICD-10-CM | POA: Diagnosis not present

## 2022-04-05 DIAGNOSIS — L89154 Pressure ulcer of sacral region, stage 4: Secondary | ICD-10-CM | POA: Diagnosis not present

## 2022-04-08 ENCOUNTER — Encounter (HOSPITAL_BASED_OUTPATIENT_CLINIC_OR_DEPARTMENT_OTHER): Payer: 59 | Admitting: Internal Medicine

## 2022-04-08 DIAGNOSIS — L89153 Pressure ulcer of sacral region, stage 3: Secondary | ICD-10-CM | POA: Diagnosis not present

## 2022-04-08 DIAGNOSIS — E43 Unspecified severe protein-calorie malnutrition: Secondary | ICD-10-CM | POA: Diagnosis not present

## 2022-04-08 DIAGNOSIS — L89154 Pressure ulcer of sacral region, stage 4: Secondary | ICD-10-CM | POA: Diagnosis not present

## 2022-04-10 ENCOUNTER — Encounter (HOSPITAL_BASED_OUTPATIENT_CLINIC_OR_DEPARTMENT_OTHER): Payer: 59 | Admitting: Physician Assistant

## 2022-04-10 DIAGNOSIS — L89153 Pressure ulcer of sacral region, stage 3: Secondary | ICD-10-CM | POA: Diagnosis not present

## 2022-04-10 DIAGNOSIS — L89154 Pressure ulcer of sacral region, stage 4: Secondary | ICD-10-CM | POA: Diagnosis not present

## 2022-04-10 DIAGNOSIS — L98491 Non-pressure chronic ulcer of skin of other sites limited to breakdown of skin: Secondary | ICD-10-CM | POA: Diagnosis not present

## 2022-04-10 DIAGNOSIS — E43 Unspecified severe protein-calorie malnutrition: Secondary | ICD-10-CM | POA: Diagnosis not present

## 2022-04-10 NOTE — Progress Notes (Signed)
Cardenas, Marissa R ([id number]) [id number] Page 1 of 5 Visit Report for 04/08/2022 Arrival Information Details Patient Name: Date of Service: [name] 04/08/2022 7:45 A M Medical Record Number: [id number] Patient Account Number: [id number] Date of Birth/Sex: Treating RN: [birth year] (72 y.o. [name] Primary Care Arturo Sofranko: [name], [name] Other Clinician: Referring Shykeria Sakamoto: Treating Odile Veloso/Extender: Dalene Seltzer in Treatment: 46 Visit Information History Since Last Visit Added or deleted any medications: No Patient Arrived: Ambulatory Any new allergies or adverse reactions: No Arrival Time: 08:13 Had a fall or experienced change in No Accompanied By: self activities of daily living that may affect Transfer Assistance: None risk of falls: Patient Identification Verified: Yes Signs or symptoms of abuse/neglect since last visito No Secondary Verification Process Completed: Yes Hospitalized since last visit: No Patient Requires Transmission-Based Precautions: No Implantable device outside of the clinic excluding No Patient Has Alerts: No cellular tissue based products placed in the center since last visit: Has Dressing in Place as Prescribed: Yes Pain Present Now: No Electronic Signature(s) Signed: 04/10/2022 8:39:24 AM By: [name] Entered By: [name] on 04/08/2022 08:13:37 -------------------------------------------------------------------------------- Clinic Level of Care Assessment Details Patient Name: Date of Service: [name] 04/08/2022 7:45 A M Medical Record Number: [id number] Patient Account Number: [id number] Date of Birth/Sex: Treating RN: [birth year] (72 y.o. [name] Primary Care Londa Mackowski: [name], [name] Other Clinician: Referring Jakylah Bassinger: Treating Fausto Sampedro/Extender: Genice Rouge., [name] in Treatment: 13 Clinic  Level of Care Assessment Items TOOL 4 Quantity Score X- 1 0 Use when only an EandM is performed on FOLLOW-UP visit ASSESSMENTS - Nursing Assessment / Reassessment X- 1 10 Reassessment of Co-morbidities (includes updates in patient status) X- 1 5 Reassessment of Adherence to Treatment Plan ASSESSMENTS - Wound and Skin A ssessment / Reassessment X - Simple Wound Assessment / Reassessment - one wound 1 5 '[]'$  - 0 Complex Wound Assessment / Reassessment - multiple wounds '[]'$  - 0 Dermatologic / Skin Assessment (not related to wound area) ASSESSMENTS - Focused Assessment '[]'$  - 0 Circumferential Edema Measurements - multi extremities '[]'$  - 0 Nutritional Assessment / Counseling / Intervention STACY, SAILER R ([id number]) [id number] Page 2 of 5 '[]'$  - 0 Lower Extremity Assessment (monofilament, tuning fork, pulses) '[]'$  - 0 Peripheral Arterial Disease Assessment (using hand held doppler) ASSESSMENTS - Ostomy and/or Continence Assessment and Care '[]'$  - 0 Incontinence Assessment and Management '[]'$  - 0 Ostomy Care Assessment and Management (repouching, etc.) PROCESS - Coordination of Care X - Simple Patient / Family Education for ongoing care 1 15 '[]'$  - 0 Complex (extensive) Patient / Family Education for ongoing care X- 1 10 Staff obtains Programmer, systems, Records, T Results / Process Orders est X- 1 10 Staff telephones HHA, Nursing Homes / Clarify orders / etc '[]'$  - 0 Routine Transfer to another Facility (non-emergent condition) '[]'$  - 0 Routine Hospital Admission (non-emergent condition) '[]'$  - 0 New Admissions / Biomedical engineer / Ordering NPWT Apligraf, etc. , '[]'$  - 0 Emergency Hospital Admission (emergent condition) X- 1 10 Simple Discharge Coordination '[]'$  - 0 Complex (extensive) Discharge Coordination PROCESS - Special Needs '[]'$  - 0 Pediatric / Minor Patient Management '[]'$  - 0 Isolation Patient Management '[]'$  - 0 Hearing / Language / Visual special  needs '[]'$  - 0 Assessment of Community assistance (transportation, D/C planning, etc.) '[]'$  - 0 Additional assistance / Altered mentation '[]'$  - 0 Support Surface(s) Assessment (bed, cushion, seat, etc.) INTERVENTIONS - Wound Cleansing /  Measurement X - Simple Wound Cleansing - one wound 1 5 '[]'$  - 0 Complex Wound Cleansing - multiple wounds X- 1 5 Wound Imaging (photographs - any number of wounds) '[]'$  - 0 Wound Tracing (instead of photographs) X- 1 5 Simple Wound Measurement - one wound '[]'$  - 0 Complex Wound Measurement - multiple wounds INTERVENTIONS - Wound Dressings X - Small Wound Dressing one or multiple wounds 1 10 '[]'$  - 0 Medium Wound Dressing one or multiple wounds '[]'$  - 0 Large Wound Dressing one or multiple wounds X- 1 5 Application of Medications - topical '[]'$  - 0 Application of Medications - injection INTERVENTIONS - Miscellaneous '[]'$  - 0 External ear exam '[]'$  - 0 Specimen Collection (cultures, biopsies, blood, body fluids, etc.) '[]'$  - 0 Specimen(s) / Culture(s) sent or taken to Lab for analysis '[]'$  - 0 Patient Transfer (multiple staff / Civil Service fast streamer / Similar devices) '[]'$  - 0 Simple Staple / Suture removal (25 or less) '[]'$  - 0 Complex Staple / Suture removal (26 or more) '[]'$  - 0 Hypo / Hyperglycemic Management (close monitor of Blood Glucose) JOCI, DRESS R ([id number]) [id number] Page 3 of 5 '[]'$  - 0 Ankle / Brachial Index (ABI) - do not check if billed separately X- 1 5 Vital Signs Has the patient been seen at the hospital within the last three years: Yes Total Score: 100 Level Of Care: New/Established - Level 3 Electronic Signature(s) Signed: 04/10/2022 8:39:24 AM By: [name] Entered By: [name] on 04/08/2022 08:14:49 -------------------------------------------------------------------------------- Encounter Discharge Information Details Patient Name: Date of Service: Marissa Perking R. 04/08/2022 7:45 A M Medical  Record Number: [id number] Patient Account Number: [id number] Date of Birth/Sex: Treating RN: 11-03-1950 (72 y.o. [name] Primary Care Tabias Swayze: [name], [name] Other Clinician: Referring Kendyll Huettner: Treating Elyanah Farino/Extender: Genice Rouge., [name] in Treatment: 13 Encounter Discharge Information Items Discharge Condition: Stable Ambulatory Status: Ambulatory Discharge Destination: Home Transportation: Private Auto Accompanied By: self Schedule Follow-up Appointment: Yes Clinical Summary of Care: Patient Declined Electronic Signature(s) Signed: 04/10/2022 8:39:24 AM By: [name] Entered By: [name] on 04/08/2022 08:14:17 -------------------------------------------------------------------------------- Patient/Caregiver Education Details Patient Name: Date of Service: [name] 1/29/2024andnbsp7:45 [place] Record Number: [id number] Patient Account Number: [id number] Date of Birth/Gender: Treating RN: 01/16/1951 (72 y.o. [name] Primary Care Physician: [name], [name] Other Clinician: Referring Physician: Treating Physician/Extender: Genice Rouge., [name] in Treatment: 13 Education Assessment Education Provided To: Patient Education Topics Provided Wound/Skin Impairment: Methods: Explain/Verbal Responses: Reinforcements needed, State content correctly [employer]) Signed: 04/10/2022 8:39:24 AM By: [name] Entered By: [name] on 04/08/2022 08:14:07 [name] ([id number]) [id number] Page 4 of 5 -------------------------------------------------------------------------------- Wound Assessment Details Patient Name: Date of Service: [name] 04/08/2022 7:45 A M Medical Record Number: [id number] Patient Account Number: [id number] Date of Birth/Sex: Treating RN: December 09, 1950 (72 y.o. Marissa Phoenix,  Cardenas Primary Care Rockell Faulks: [name], [name] Other Clinician: Referring Rhone Ozaki: Treating Thuan Tippett/Extender: Genice Rouge., [name] in Treatment: 13 Wound Status Wound Number: 1 Primary Etiology: Pressure Ulcer Wound Location: Sacrum Wound Status: Open Wounding Event: Pressure Injury Date Acquired: 10/09/2021 Weeks Of Treatment: 13 Clustered Wound: No Wound Measurements Length: (cm) 0.3 Width: (cm) 0.2 Depth: (cm) 0.5 Area: (cm) 0.047 Volume: (cm) 0.024 % Reduction in Area: 86.7% % Reduction in Volume: 90.3% Wound Description Classification: Category/Stage IV Exudate Amount: Medium Exudate Type: Serosanguineous Exudate Color: red, brown Periwound Skin Texture Texture  Color No Abnormalities Noted: No No Abnormalities Noted: No Moisture No Abnormalities Noted: No Treatment Notes Wound #1 (Sacrum) Cleanser Wound Cleanser Discharge Instruction: Cleanse the wound with wound cleanser prior to applying a clean dressing using gauze sponges, not tissue or cotton balls. Peri-Wound Care Skin Prep Discharge Instruction: Use skin prep as directed Topical Primary Dressing Iodoform packing strip 1/4 (in) Discharge Instruction: Lightly pack as instructed. Please pack lightly into wound and all around the undermining as it is 4cm all around. This needs to be done 3 x a week. Secondary Dressing Zetuvit Plus Silicone Border Dressing 4x4 (in/in) Discharge Instruction: Apply silicone border over primary dressing as directed. Secured With Compression Wrap Compression Stockings Add-Ons SERENITY, BATLEY ([id number]) [id number] Page 5 of 5 Electronic Signature(s) Signed: 04/10/2022 8:39:24 AM By: [name] Entered By: [name] on 04/08/2022 08:13:50 -------------------------------------------------------------------------------- Vitals Details Patient Name: Date of Service: Marissa Perking R. 04/08/2022 7:45 A  M Medical Record Number: [id number] Patient Account Number: [id number] Date of Birth/Sex: Treating RN: 1950/04/08 (72 y.o. [name] Primary Care Kayde Warehime: [name], [name] Other Clinician: Referring Julieana Eshleman: Treating Dyamon Sosinski/Extender: Dalene Seltzer in Treatment: 13 Vital Signs Time Taken: 08:13 Reference Range: 80 - 120 mg / dl Height (in): 64 Weight (lbs): 97 Body Mass Index (BMI): 16.6 Electronic Signature(s) Signed: 04/10/2022 8:39:24 AM By: [name] Entered By: [name] on 04/08/2022 08:13:43

## 2022-04-10 NOTE — Progress Notes (Signed)
[name], [name] ([id number]) [id number] Page 1 of 1 Visit Report for 04/08/2022 SuperBill Details Patient Name: Date of Service: [name] 04/08/2022 Medical Record Number: [id number] Patient Account Number: [id number] Date of Birth/Sex: Treating RN: 1950/07/26 (72 y.o. [name], [name] Primary Care Provider: [name], [name] Other Clinician: Referring Provider: Treating Provider/Extender: Dalene Seltzer in Treatment: 13 Diagnosis Coding ICD-10 Codes Code Description M62.81 Muscle weakness (generalized) E43 Unspecified severe protein-calorie malnutrition L89.154 Pressure ulcer of sacral region, stage 4 Facility Procedures CPT4 Code Description Modifier Quantity [id number] - WOUND CARE VISIT-LEV 3 EST PT 1 Electronic Signature(s) Signed: 04/08/2022 11:13:35 AM By: Kalman Shan DO Signed: 04/10/2022 8:39:24 AM By: [name] Entered By: [name] on 04/08/2022 08:14:55

## 2022-04-10 NOTE — Progress Notes (Signed)
RANATA, LAUGHERY R ([id number]) [id number] Page 1 of 5 Visit Report for 04/05/2022 Arrival Information Details Patient Name: Date of Service: [name] 04/05/2022 11:30 A M Medical Record Number: [id number] Patient Account Number: [id number] Date of Birth/Sex: Treating RN: 1950/05/08 (72 y.o. [name], [name] Primary Care Cathi Hazan: [name], [name] Other Clinician: Referring Maxtyn Nuzum: Treating Chanler Schreiter/Extender: Dalene Seltzer in Treatment: 19 Visit Information History Since Last Visit Added or deleted any medications: No Patient Arrived: Ambulatory Any new allergies or adverse reactions: No Arrival Time: 11:50 Had a fall or experienced change in No Accompanied By: self activities of daily living that may affect Transfer Assistance: None risk of falls: Patient Identification Verified: Yes Signs or symptoms of abuse/neglect since last visito No Secondary Verification Process Completed: Yes Hospitalized since last visit: No Patient Requires Transmission-Based Precautions: No Implantable device outside of the clinic excluding No Patient Has Alerts: No cellular tissue based products placed in the center since last visit: Has Dressing in Place as Prescribed: Yes Pain Present Now: No Electronic Signature(s) Signed: 04/10/2022 8:39:24 AM By: [name] Entered By: [name] on 04/05/2022 11:50:32 -------------------------------------------------------------------------------- Clinic Level of Care Assessment Details Patient Name: Date of Service: [name] 04/05/2022 11:30 A M Medical Record Number: [id number] Patient Account Number: [id number] Date of Birth/Sex: Treating RN: 07-07-1950 (72 y.o. [name], [name] Primary Care Maryori Weide: [name], [name] Other Clinician: Referring Ha Shannahan: Treating Sanaz Scarlett/Extender: Genice Rouge., [name] in Treatment: 13 Clinic  Level of Care Assessment Items TOOL 4 Quantity Score X- 1 0 Use when only an EandM is performed on FOLLOW-UP visit ASSESSMENTS - Nursing Assessment / Reassessment X- 1 10 Reassessment of Marissa-morbidities (includes updates in patient status) X- 1 5 Reassessment of Adherence to Treatment Plan ASSESSMENTS - Wound and Skin A ssessment / Reassessment X - Simple Wound Assessment / Reassessment - one wound 1 5 '[]'$  - 0 Complex Wound Assessment / Reassessment - multiple wounds '[]'$  - 0 Dermatologic / Skin Assessment (not related to wound area) ASSESSMENTS - Focused Assessment '[]'$  - 0 Circumferential Edema Measurements - multi extremities '[]'$  - 0 Nutritional Assessment / Counseling / Intervention RAIDEN, YEARWOOD R ([id number]) [phone].pdf Page 2 of 5 '[]'$  - 0 Lower Extremity Assessment (monofilament, tuning fork, pulses) '[]'$  - 0 Peripheral Arterial Disease Assessment (using hand held doppler) ASSESSMENTS - Ostomy and/or Continence Assessment and Care '[]'$  - 0 Incontinence Assessment and Management '[]'$  - 0 Ostomy Care Assessment and Management (repouching, etc.) PROCESS - Coordination of Care X - Simple Patient / Family Education for ongoing care 1 15 '[]'$  - 0 Complex (extensive) Patient / Family Education for ongoing care X- 1 10 Staff obtains Programmer, systems, Records, T Results / Process Orders est '[]'$  - 0 Staff telephones HHA, Nursing Homes / Clarify orders / etc '[]'$  - 0 Routine Transfer to another Facility (non-emergent condition) '[]'$  - 0 Routine Hospital Admission (non-emergent condition) '[]'$  - 0 New Admissions / Biomedical engineer / Ordering NPWT Apligraf, etc. , '[]'$  - 0 Emergency Hospital Admission (emergent condition) X- 1 10 Simple Discharge Coordination '[]'$  - 0 Complex (extensive) Discharge Coordination PROCESS - Special Needs '[]'$  - 0 Pediatric / Minor Patient Management '[]'$  - 0 Isolation Patient Management '[]'$  - 0 Hearing / Language / Visual special needs '[]'$   - 0 Assessment of Community assistance (transportation, D/C planning, etc.) '[]'$  - 0 Additional assistance / Altered mentation '[]'$  - 0 Support Surface(s) Assessment (bed, cushion, seat, etc.) INTERVENTIONS - Wound Cleansing /  Measurement X - Simple Wound Cleansing - one wound 1 5 '[]'$  - 0 Complex Wound Cleansing - multiple wounds X- 1 5 Wound Imaging (photographs - any number of wounds) '[]'$  - 0 Wound Tracing (instead of photographs) X- 1 5 Simple Wound Measurement - one wound '[]'$  - 0 Complex Wound Measurement - multiple wounds INTERVENTIONS - Wound Dressings X - Small Wound Dressing one or multiple wounds 1 10 '[]'$  - 0 Medium Wound Dressing one or multiple wounds '[]'$  - 0 Large Wound Dressing one or multiple wounds '[]'$  - 0 Application of Medications - topical '[]'$  - 0 Application of Medications - injection INTERVENTIONS - Miscellaneous '[]'$  - 0 External ear exam '[]'$  - 0 Specimen Collection (cultures, biopsies, blood, body fluids, etc.) '[]'$  - 0 Specimen(s) / Culture(s) sent or taken to Lab for analysis '[]'$  - 0 Patient Transfer (multiple staff / Civil Service fast streamer / Similar devices) '[]'$  - 0 Simple Staple / Suture removal (25 or less) '[]'$  - 0 Complex Staple / Suture removal (26 or more) '[]'$  - 0 Hypo / Hyperglycemic Management (close monitor of Blood Glucose) BRIAUNA, GILMARTIN R ([id number]) [phone].pdf Page 3 of 5 '[]'$  - 0 Ankle / Brachial Index (ABI) - do not check if billed separately '[]'$  - 0 Vital Signs Has the patient been seen at the hospital within the last three years: Yes Total Score: 80 Level Of Care: New/Established - Level 3 Electronic Signature(s) Signed: 04/10/2022 8:39:24 AM By: [name] Entered By: [name] on 04/05/2022 11:51:37 -------------------------------------------------------------------------------- Encounter Discharge Information Details Patient Name: Date of Service: Marissa Perking R. 04/05/2022 11:30 A M Medical Record  Number: [id number] Patient Account Number: [id number] Date of Birth/Sex: Treating RN: 1951/02/10 (72 y.o. [name], [name] Primary Care Jaycen Vercher: [name], [name] Other Clinician: Referring Valen Mascaro: Treating Khy Pitre/Extender: Genice Rouge., [name] in Treatment: 13 Encounter Discharge Information Items Discharge Condition: Stable Ambulatory Status: Ambulatory Discharge Destination: Home Transportation: Private Auto Accompanied By: self Schedule Follow-up Appointment: Yes Clinical Summary of Care: Patient Declined Electronic Signature(s) Signed: 04/10/2022 8:39:24 AM By: [name] Entered By: [name] on 04/05/2022 11:51:10 -------------------------------------------------------------------------------- Patient/Caregiver Education Details Patient Name: Date of Service: [name] 1/26/2024andnbsp11:30 [name] Record Number: [id number] Patient Account Number: [id number] Date of Birth/Gender: Treating RN: 03/29/1950 (72 y.o. [name], [name] Primary Care Physician: [name], [name] Other Clinician: Referring Physician: Treating Physician/Extender: Genice Rouge., [name] in Treatment: 13 Education Assessment Education Provided To: Patient Education Topics Provided Wound/Skin Impairment: Methods: Explain/Verbal Responses: Reinforcements needed, State content correctly [employer]) Signed: 04/10/2022 8:39:24 AM By: [name] Entered By: [name] on 04/05/2022 11:51:01 [name] ([id number]) [phone].pdf Page 4 of 5 -------------------------------------------------------------------------------- Wound Assessment Details Patient Name: Date of Service: [name] 04/05/2022 11:30 A M Medical Record Number: [id number] Patient Account Number: [id number] Date of Birth/Sex: Treating RN: 1951/01/02 (72 y.o. [name],  [name] Primary Care Shevawn Langenberg: [name], [name] Other Clinician: Referring Shirl Weir: Treating Liviya Santini/Extender: Genice Rouge., [name] in Treatment: 13 Wound Status Wound Number: 1 Primary Etiology: Pressure Ulcer Wound Location: Sacrum Wound Status: Open Wounding Event: Pressure Injury Date Acquired: 10/09/2021 Weeks Of Treatment: 13 Clustered Wound: No Wound Measurements Length: (cm) 0.3 Width: (cm) 0.2 Depth: (cm) 0.5 Area: (cm) 0.047 Volume: (cm) 0.024 % Reduction in Area: 86.7% % Reduction in Volume: 90.3% Wound Description Classification: Category/Stage IV Exudate Amount: Medium Exudate Type: Serosanguineous Exudate Color: red, brown Periwound Skin Texture Texture  Color No Abnormalities Noted: No No Abnormalities Noted: No Moisture No Abnormalities Noted: No Treatment Notes Wound #1 (Sacrum) Cleanser Wound Cleanser Discharge Instruction: Cleanse the wound with wound cleanser prior to applying a clean dressing using gauze sponges, not tissue or cotton balls. Peri-Wound Care Skin Prep Discharge Instruction: Use skin prep as directed Topical Primary Dressing Iodoform packing strip 1/4 (in) Discharge Instruction: Lightly pack as instructed. Please pack lightly into wound and all around the undermining as it is 4cm all around. This needs to be done 3 x a week. Secondary Dressing Zetuvit Plus Silicone Border Dressing 4x4 (in/in) Discharge Instruction: Apply silicone border over primary dressing as directed. Secured With Compression Wrap Compression Stockings Add-Ons ALDORA, PERMAN R ([id number]) [id number] Page 5 of 5 Electronic Signature(s) Signed: 04/10/2022 8:39:24 AM By: [name] Entered By: [name] on 04/05/2022 11:50:45 -------------------------------------------------------------------------------- Vitals Details Patient Name: Date of Service: Marissa Perking R. 04/05/2022 11:30  A M Medical Record Number: [id number] Patient Account Number: [id number] Date of Birth/Sex: Treating RN: June 05, 1950 (72 y.o. [name], [name] Primary Care Elisa Kutner: [name], [name] Other Clinician: Referring Jeven Topper: Treating [place] Oborn/Extender: Dalene Seltzer in Treatment: 13 Vital Signs Time Taken: 11:50 Reference Range: 80 - 120 mg / dl Height (in): 64 Weight (lbs): 97 Body Mass Index (BMI): 16.6 Electronic Signature(s) Signed: 04/10/2022 8:39:24 AM By: [name] Entered By: [name] on 04/05/2022 11:50:39

## 2022-04-10 NOTE — Progress Notes (Addendum)
KASSADY, LABOY R ([id number]) [id number] Page 1 of 6 Visit Report for 04/10/2022 Chief Complaint Document Details Patient Name: Date of Service: [name] 04/10/2022 1:15 PM Medical Record Number: [id number] Patient Account Number: [id number] Date of Birth/Sex: Treating RN: [birth year] (72 y.o. [name], [name] Primary Care Provider: [name], [name] Other Clinician: Referring Provider: Treating Provider/Extender: [name] in Treatment: 14 Information Obtained from: Patient Chief Complaint Pressure ulcer stage 3 Sacrum Electronic Signature(s) Signed: 04/10/2022 12:56:15 PM By: Worthy Keeler PA-C Entered By: Worthy Keeler on 04/10/2022 12:56:14 -------------------------------------------------------------------------------- HPI Details Patient Name: Date of Service: [name] 04/10/2022 1:15 PM Medical Record Number: [id number] Patient Account Number: [id number] Date of Birth/Sex: Treating RN: Aug 22, 1950 (72 y.o. [name], [name] Primary Care Provider: [name], [name] Other Clinician: Referring Provider: Treating Provider/Extender: [name] in Treatment: 14 History of Present Illness HPI Description: 01-02-22 upon evaluation today patient presents for initial inspection here in our clinic concerning a wound that she has over the sacral region. This is stated to be present since around the beginning of August 2023. She currently resides in a assisted nursing facility. She does seem to be able to answer questions fully today. Subsequently I do note that she is a little on the thin side but again other than the protein calorie malnutrition she is minimally weak but still does get up and move around some but she also tells me that she "sits a lot". She has not had any x-rays of the sacral region at this point. 01-09-2022 upon evaluation today patient's wound in the sacral  area actually appears to be doing decently well. Fortunately I do not see any signs of infection which is great news and overall I am extremely pleased with where things stand today. 11/8; this is a patient who lives in some form of small assisted living in [place]. She has a deep wound over the lower part of her coccyx. An x-ray that we ordered from last time did not show any osseous abnormalities. We are supposed to be using Hydrofera Blue in the wound but I am really not sure what they are using to dress this and who is doing it. Talking to our staff they have apparently discussed this with the staff in the facility. She does not have home health. 01-23-2022 upon evaluation today patient appears to be doing decently well in regard to her wound. She has been tolerating the dressing changes without complication although I am not certain the dressing changes have been done appropriately over the past couple of weeks. We have been trying to get her to come in here we also try to get her into a wound care center in [place] which would be closer for the assisted living facility. Unfortunately neither 1 of those were undertaken up to this point. The patient did end up having some training of the staff from an RN that they brought him to have the staff perform the dressing changes but that being said it does not sound like this has been done correctly. Nonetheless I do believe that based on what we see it would benefit the patient to actually have her come here 3 times a week also think a wound VAC could potentially be a possibility for her which would help to get things moving in a much better direction as far as healing is concerned. We need to get this to fill-in though  it looks clean and does not look infected I do think that we need to really get this to fill-in and there is quite a bit of undermining that is to be considered here. 01-30-2022 upon evaluation today patient appears to be doing well  currently in regard to her wound which is looking pretty decent but still has quite a bit of space underneath as far as undermining is concerned. I think she might possibly be better with a wound VAC. With that being said if when I do this we probably only be able to do it 2 times a week at most. I discussed that with the patient today she is okay with that we just need to see if we can get the insurance approval and then of course the scheduling underway. 02-13-2022 upon evaluation today patient appears to be doing well currently in regard to her wound. She actually tells me that it is feeling a lot better which is good news. Fortunately I do not see any signs of active infection at this time. No fevers, chills, nausea, vomiting, or diarrhea. 02-27-2022 upon evaluation today patient appears to be doing well currently in regard to her wound. She is actually showing some signs of improvement we are SATORIA, DUNLOP R ([id number]) [id number] Page 2 of 6 still obtaining the approval for the snap VAC which I think could be beneficial in the meantime we been using the Hydrofera Blue rope. 03-27-2022 upon evaluation today patient appears to be doing okay in regard to her wound but there was no dressing in place upon arrival today. Fortunately I see again no evidence of infection. No fevers, chills, nausea, vomiting, or diarrhea. 04-03-2022 upon evaluation today patient did not have a dressing on when she came into the office. With that being said she actually tells me that it was on until yesterday she took a shower and it came loose and then today came off completely. With that being said I really do not think she needs to be taking shower every single day or having to see her here in the clinic 3 times per week on Monday, Wednesday, and Friday and during those times obviously in between she needs to probably avoid showering. For that reason what I advised her to do would be to  shower in the mornings on the days that she comes to see [place] that way if the dressing does come off or start to come off we will be changing it anyway. She voiced understanding and tells me she can do that. Unfortunately the facility has nobody to help her with changing the dressings and we are not currently having to do that here in the clinic again. This means that during the time that she was not coming in from December 20 through when I saw her last week on the 17th there was a period of 2 weeks where the wound really was not changed at all as the RN that Myriam Jacobson tell me they had at the facility did not end up working out. This obviously is not good and is not what we expect to see as far as patient care is concerned which is why we are now seeing her 3 times a week here in the clinic. 04-10-2022 upon evaluation today patient appears to be doing well currently in regard to her wound which is actually showing signs of good granulation epithelization at this point. However she does have quite a bit of drainage and we are little concerned about the  possibility of infection. For that reason I think she could benefit from a PCR culture followed by [facility] topical antibiotics. I am also thinking of going ahead and doing gentamicin today as well. Electronic Signature(s) Signed: 04/10/2022 1:44:31 PM By: Worthy Keeler PA-C Entered By: Worthy Keeler on 04/10/2022 13:44:31 -------------------------------------------------------------------------------- Physical Exam Details Patient Name: Date of Service: [name] 04/10/2022 1:15 PM Medical Record Number: [id number] Patient Account Number: [id number] Date of Birth/Sex: Treating RN: [birth year] (72 y.o. [name], [name] Primary Care Provider: [name], [name] Other Clinician: Referring Provider: Treating Provider/Extender: [name] in Treatment: 59 Constitutional Well-nourished and well-hydrated in no acute  distress. Respiratory normal breathing without difficulty. Psychiatric this patient is able to make decisions and demonstrates good insight into disease process. Alert and Oriented x 3. pleasant and cooperative. Notes Upon inspection patient's wound did show signs of continued drainage there is still quite a bit of undermining. Again this is something that we are trying to manage as best we can and again she seems to be doing everything that were telling her she has never been noncompliant with any of the recommendation she is even avoiding showers on the days that she is not coming in for the dressing change so that the wound dressing will stay in place. I am very pleased with the compliance here. Electronic Signature(s) Signed: 04/10/2022 2:57:53 PM By: Worthy Keeler PA-C Entered By: Worthy Keeler on 04/10/2022 14:57:53 -------------------------------------------------------------------------------- Physician Orders Details Patient Name: Date of Service: [name] 04/10/2022 1:15 PM Medical Record Number: [id number] Patient Account Number: [id number] Date of Birth/Sex: Treating RN: 05/04/1950 (72 y.o. [name], [name] Primary Care Provider: [name], [name] Other Clinician: Referring Provider: Treating Provider/Extender: [name] in Treatment: 14 Verbal / Phone Orders: No KENLEA, WOODELL ([id number]) [id number] Page 3 of 6 Diagnosis Coding ICD-10 Coding Code Description M62.81 Muscle weakness (generalized) E43 Unspecified severe protein-calorie malnutrition L89.154 Pressure ulcer of sacral region, stage 4 Follow-up Appointments ppointment in 1 week. - w/ Margarita Grizzle Dellia Nims covering) on WEdnesday 04/17/22 @ 12:30 Rm # 7 Return A Nurse Visit: - every MOnday and Friday (go ahead and schedule for next week) This Friday nurse visit 04/12/22 @ 7:45 (okay to make new slot and have appt. times overlap) Other: -  Administrator Arbutus Ped) : [phone] ***We did a PCR culture today 04/10/22 and based on that, we plan to order [facility], a topical antibiotic compound. MOre than likely, they will reach out to group home about payment and how to get it sent to group home. ONce you receive it, be sure to bring with pt. to every appointment.*** Anesthetic (In clinic) Topical Lidocaine 5% applied to wound bed Off-Loading Turn and reposition every 2 hours [facility] for skilled nursing wound care. May utilize formulary equivalent dressing for wound treatment orders unless otherwise specified. - ADmit to [facility]. Home health to change Monday's and Friday's, wound care center to change Wednesday's. Wound care center will change Monday's, Wednesday's, and FRiday's until home health can get approved. Wound Treatment Wound #1 - Sacrum Cleanser: Wound Cleanser (Home Health) 3 x Per Week/30 Days Discharge Instructions: Cleanse the wound with wound cleanser prior to applying a clean dressing using gauze sponges, not tissue or cotton balls. Peri-Wound Care: Skin Prep (Home Health) 3 x Per Week/30 Days Discharge Instructions: Use skin prep as directed Topical: Gentamicin 3 x Per Week/30 Days  Discharge Instructions: As directed by physician Prim Dressing: Iodoform packing strip 1/4 (in) 3 x Per Week/30 Days ary Discharge Instructions: Lightly pack as instructed. Please pack lightly into wound and all around the undermining as it is 4cm all around. This needs to be done 3 x a week. Secondary Dressing: Zetuvit Plus Silicone Border Dressing 4x4 (in/in) (Home Health) 3 x Per Week/30 Days Discharge Instructions: Apply silicone border over primary dressing as directed. Electronic Signature(s) Signed: 04/10/2022 4:50:42 PM By: Worthy Keeler PA-C Signed: 04/12/2022 8:21:34 AM By: [name] Entered By: [name] on 04/10/2022  13:35:54 -------------------------------------------------------------------------------- Problem List Details Patient Name: Date of Service: [name] 04/10/2022 1:15 PM Medical Record Number: [id number] Patient Account Number: [id number] Date of Birth/Sex: Treating RN: 1951-02-28 (72 y.o. [name], Meta.Reding Primary Care Provider: [name], [name] Other Clinician: Referring Provider: Treating Provider/Extender: [name] in Treatment: [address] Problems ICD-10 TIJA, BISS R ([id number]) [id number] Page 4 of 6 Encounter Code Description Active Date MDM Diagnosis M62.81 Muscle weakness (generalized) 01/02/2022 No Yes E43 Unspecified severe protein-calorie malnutrition 01/02/2022 No Yes L89.154 Pressure ulcer of sacral region, stage 4 01/02/2022 No Yes Inactive Problems Resolved Problems Electronic Signature(s) Signed: 04/10/2022 12:55:59 PM By: Worthy Keeler PA-C Entered By: Worthy Keeler on 04/10/2022 12:55:59 -------------------------------------------------------------------------------- Progress Note Details Patient Name: Date of Service: [name] 04/10/2022 1:15 PM Medical Record Number: [id number] Patient Account Number: [id number] Date of Birth/Sex: Treating RN: 06/21/1950 (72 y.o. [name], [name] Primary Care Provider: [name], [name] Other Clinician: Referring Provider: Treating Provider/Extender: [name] in Treatment: 14 Subjective Chief Complaint Information obtained from Patient Pressure ulcer stage 3 Sacrum History of Present Illness (HPI) 01-02-22 upon evaluation today patient presents for initial inspection here in our clinic concerning a wound that she has over the sacral region. This is stated to be present since around the beginning of August 2023. She currently resides in a assisted nursing facility. She does seem to be able to answer  questions fully today. Subsequently I do note that she is a little on the thin side but again other than the protein calorie malnutrition she is minimally weak but still does get up and move around some but she also tells me that she "sits a lot". She has not had any x-rays of the sacral region at this point. 01-09-2022 upon evaluation today patient's wound in the sacral area actually appears to be doing decently well. Fortunately I do not see any signs of infection which is great news and overall I am extremely pleased with where things stand today. 11/8; this is a patient who lives in some form of small assisted living in [place]. She has a deep wound over the lower part of her coccyx. An x-ray that we ordered from last time did not show any osseous abnormalities. We are supposed to be using Hydrofera Blue in the wound but I am really not sure what they are using to dress this and who is doing it. Talking to our staff they have apparently discussed this with the staff in the facility. She does not have home health. 01-23-2022 upon evaluation today patient appears to be doing decently well in regard to her wound. She has been tolerating the dressing changes without complication although I am not certain the dressing changes have been done appropriately over the past couple of weeks. We have been trying to get her  to come in here we also try to get her into a wound care center in [place] which would be closer for the assisted living facility. Unfortunately neither 1 of those were undertaken up to this point. The patient did end up having some training of the staff from an RN that they brought him to have the staff perform the dressing changes but that being said it does not sound like this has been done correctly. Nonetheless I do believe that based on what we see it would benefit the patient to actually have her come here 3 times a week also think a wound VAC could potentially be a possibility for her  which would help to get things moving in a much better direction as far as healing is concerned. We need to get this to fill-in though it looks clean and does not look infected I do think that we need to really get this to fill-in and there is quite a bit of undermining that is to be considered here. 01-30-2022 upon evaluation today patient appears to be doing well currently in regard to her wound which is looking pretty decent but still has quite a bit of space underneath as far as undermining is concerned. I think she might possibly be better with a wound VAC. With that being said if when I do this we probably only be able to do it 2 times a week at most. I discussed that with the patient today she is okay with that we just need to see if we can get the insurance approval and then of course the scheduling underway. 02-13-2022 upon evaluation today patient appears to be doing well currently in regard to her wound. She actually tells me that it is feeling a lot better which is good news. Fortunately I do not see any signs of active infection at this time. No fevers, chills, nausea, vomiting, or diarrhea. 02-27-2022 upon evaluation today patient appears to be doing well currently in regard to her wound. She is actually showing some signs of improvement we are still obtaining the approval for the snap VAC which I think could be beneficial in the meantime we been using the Hydrofera Blue rope. 03-27-2022 upon evaluation today patient appears to be doing okay in regard to her wound but there was no dressing in place upon arrival today. Fortunately I KACIE, HUXTABLE R ([id number]) [id number] Page 5 of 6 see again no evidence of infection. No fevers, chills, nausea, vomiting, or diarrhea. 04-03-2022 upon evaluation today patient did not have a dressing on when she came into the office. With that being said she actually tells me that it was on until yesterday she took a shower and it  came loose and then today came off completely. With that being said I really do not think she needs to be taking shower every single day or having to see her here in the clinic 3 times per week on Monday, Wednesday, and Friday and during those times obviously in between she needs to probably avoid showering. For that reason what I advised her to do would be to shower in the mornings on the days that she comes to see [place] that way if the dressing does come off or start to come off we will be changing it anyway. She voiced understanding and tells me she can do that. Unfortunately the facility has nobody to help her with changing the dressings and we are not currently having to do that here in the clinic  again. This means that during the time that she was not coming in from December 20 through when I saw her last week on the 17th there was a period of 2 weeks where the wound really was not changed at all as the RN that Myriam Jacobson tell me they had at the facility did not end up working out. This obviously is not good and is not what we expect to see as far as patient care is concerned which is why we are now seeing her 3 times a week here in the clinic. 04-10-2022 upon evaluation today patient appears to be doing well currently in regard to her wound which is actually showing signs of good granulation epithelization at this point. However she does have quite a bit of drainage and we are little concerned about the possibility of infection. For that reason I think she could benefit from a PCR culture followed by [facility] topical antibiotics. I am also thinking of going ahead and doing gentamicin today as well. Objective Constitutional Well-nourished and well-hydrated in no acute distress. Vitals Time Taken: 12:52 PM, Height: 64 in, Weight: 97 lbs, BMI: 16.6, Temperature: 97.9 F, Pulse: 72 bpm, Respiratory Rate: 16 breaths/min, Blood Pressure: 101/66 mmHg. Respiratory normal breathing without  difficulty. Psychiatric this patient is able to make decisions and demonstrates good insight into disease process. Alert and Oriented x 3. pleasant and cooperative. General Notes: Upon inspection patient's wound did show signs of continued drainage there is still quite a bit of undermining. Again this is something that we are trying to manage as best we can and again she seems to be doing everything that were telling her she has never been noncompliant with any of the recommendation she is even avoiding showers on the days that she is not coming in for the dressing change so that the wound dressing will stay in place. I am very pleased with the compliance here. Integumentary (Hair, Skin) Wound #1 status is Open. Original cause of wound was Pressure Injury. The date acquired was: 10/09/2021. The wound has been in treatment 14 weeks. The wound is located on the Sacrum. The wound measures 0.6cm length x 0.5cm width x 1cm depth; 0.236cm^2 area and 0.236cm^3 volume. There is Fat Layer (Subcutaneous Tissue) exposed. There is no tunneling noted, however, there is undermining starting at 12:00 and ending at 12:00 with a maximum distance of 2cm. There is a medium amount of purulent drainage noted. The wound margin is distinct with the outline attached to the wound base. There is large (67-100%) red, pink granulation within the wound bed. There is no necrotic tissue within the wound bed. The periwound skin appearance did not exhibit: Callus, Crepitus, Excoriation, Induration, Rash, Scarring, Dry/Scaly, Maceration, Atrophie Blanche, Cyanosis, Ecchymosis, Hemosiderin Staining, Mottled, Pallor, Rubor, Erythema. Assessment Active Problems ICD-10 Muscle weakness (generalized) Unspecified severe protein-calorie malnutrition Pressure ulcer of sacral region, stage 4 Plan Follow-up Appointments: Return Appointment in 1 week. - w/ Margarita Grizzle Dellia Nims covering) on WEdnesday 04/17/22 @ 12:30 Rm # 7 Nurse Visit: - every  MOnday and Friday (go ahead and schedule for next week) This Friday nurse visit 04/12/22 @ 7:45 (okay to make new slot and have appt. times overlap) Other: - Administrator Arbutus Ped) : [phone] ***We did a PCR culture today 04/10/22 and based on that, we plan to order [facility], a topical antibiotic compound. MOre than likely, they will reach out to group home about payment and how to get it sent to group home. ONce you receive it, be sure  to bring with pt. to every appointment.*** Anesthetic: (In clinic) Topical Lidocaine 5% applied to wound bed Off-Loading: Turn and reposition every 2 hours Home Health: Admit to [facility] for skilled nursing wound care. May utilize formulary equivalent dressing for wound treatment orders unless otherwise specified. - ADmit to [facility]. Home health to change Monday's and Friday's, wound care center to change Wednesday's. Wound care center will change Monday's, Wednesday's, and FRiday's until home health can get approved. TAHIRAH, SARA R ([id number]) [id number] Page 6 of 6 WOUND #1: - Sacrum Wound Laterality: Cleanser: Wound Cleanser (Home Health) 3 x Per Week/30 Days Discharge Instructions: Cleanse the wound with wound cleanser prior to applying a clean dressing using gauze sponges, not tissue or cotton balls. Peri-Wound Care: Skin Prep (Home Health) 3 x Per Week/30 Days Discharge Instructions: Use skin prep as directed Topical: Gentamicin 3 x Per Week/30 Days Discharge Instructions: As directed by physician Prim Dressing: Iodoform packing strip 1/4 (in) 3 x Per Week/30 Days ary Discharge Instructions: Lightly pack as instructed. Please pack lightly into wound and all around the undermining as it is 4cm all around. This needs to be done 3 x a week. Secondary Dressing: Zetuvit Plus Silicone Border Dressing 4x4 (in/in) (Home Health) 3 x Per Week/30 Days Discharge Instructions: Apply silicone border over primary dressing as  directed. 1. Based on what I am seeing I do believe that we should continue with the packing. I did obtain a PCR culture and the present on the results of the culture I am probably going to look towards getting her [facility] topical antibiotics. I discussed that with her today she is in agreement with that plan. In the interim until we get the [facility] I would recommend gentamicin topically to be used in the wound. 2. I am also can recommend that we have the patient continue to monitor for any signs of infection or worsening overall. Obviously if anything changes she knows contact the office and let me know. 3. I do think she needs to continue to come in for dressing changes 3 times per week here unfortunately we have been unable to obtain home health for her and subsequently being that she is in a group home there really is not much that we can do in order to get additional individuals who can perform the dressing changes at this time. Unfortunately the previous RN that they had hired did not work out apparently according to [employer] the Financial controller. We will see patient back for reevaluation in 1 week here in the clinic. If anything worsens or changes patient will contact our office for additional recommendations. Electronic Signature(s) Signed: 04/10/2022 2:59:33 PM By: Worthy Keeler PA-C Entered By: Worthy Keeler on 04/10/2022 14:59:33 -------------------------------------------------------------------------------- SuperBill Details Patient Name: Date of Service: [name] 04/10/2022 Medical Record Number: [id number] Patient Account Number: [id number] Date of Birth/Sex: Treating RN: [birth year] (72 y.o. [name], [name] Primary Care Provider: [name], [name] Other Clinician: Referring Provider: Treating Provider/Extender: [name] in Treatment: 14 Diagnosis Coding ICD-10 Codes Code Description M62.81 Muscle weakness (generalized) E43 Unspecified  severe protein-calorie malnutrition L89.154 Pressure ulcer of sacral region, stage 4 Facility Procedures : CPT4 Code: [id number] Description: 99213 - WOUND CARE VISIT-LEV 3 EST PT Modifier: Quantity: 1 Physician Procedures : CPT4 Code Description Modifier [id number] 99214 - WC PHYS LEVEL 4 - EST PT ICD-10 Diagnosis Description M62.81 Muscle weakness (generalized) E43 Unspecified severe protein-calorie malnutrition L89.154 Pressure ulcer of sacral region,  stage 4 Quantity: 1 Electronic Signature(s) Signed: 04/10/2022 2:59:57 PM By: Worthy Keeler PA-C Entered By: Worthy Keeler on 04/10/2022 14:59:57

## 2022-04-10 NOTE — Progress Notes (Signed)
CITLALLY, CAPTAIN R ([id number]) [id number] Page 1 of 1 Visit Report for 04/05/2022 SuperBill Details Patient Name: Date of Service: [name] 04/05/2022 Medical Record Number: [id number] Patient Account Number: [id number] Date of Birth/Sex: Treating RN: February 12, 1951 (72 y.o. [name], [name] Primary Care Provider: [name], [name] Other Clinician: Referring Provider: Treating Provider/Extender: Dalene Seltzer in Treatment: 13 Diagnosis Coding ICD-10 Codes Code Description M62.81 Muscle weakness (generalized) E43 Unspecified severe protein-calorie malnutrition L89.154 Pressure ulcer of sacral region, stage 4 Facility Procedures CPT4 Code Description Modifier Quantity [id number] 99213 - WOUND CARE VISIT-LEV 3 EST PT 1 Electronic Signature(s) Signed: 04/08/2022 11:13:35 AM By: Kalman Shan DO Signed: 04/10/2022 8:39:24 AM By: [name] Entered By: [name] on 04/05/2022 11:51:44

## 2022-04-12 ENCOUNTER — Encounter (HOSPITAL_BASED_OUTPATIENT_CLINIC_OR_DEPARTMENT_OTHER): Payer: 59 | Attending: Internal Medicine | Admitting: Internal Medicine

## 2022-04-12 DIAGNOSIS — L89154 Pressure ulcer of sacral region, stage 4: Secondary | ICD-10-CM | POA: Insufficient documentation

## 2022-04-12 DIAGNOSIS — M6281 Muscle weakness (generalized): Secondary | ICD-10-CM | POA: Diagnosis not present

## 2022-04-12 DIAGNOSIS — E43 Unspecified severe protein-calorie malnutrition: Secondary | ICD-10-CM | POA: Insufficient documentation

## 2022-04-12 DIAGNOSIS — L89153 Pressure ulcer of sacral region, stage 3: Secondary | ICD-10-CM | POA: Diagnosis present

## 2022-04-12 NOTE — Progress Notes (Signed)
[name] ([id number]) [id number] Page 1 of 5 Visit Report for 04/12/2022 Arrival Information Details Patient Name: Date of Service: [name] 04/12/2022 7:45 A M Medical Record Number: [id number] Patient Account Number: [id number] Date of Birth/Sex: Treating RN: 1950-08-23 (72 y.o. [name], [name] Primary Care Kelley Polinsky: [name], [name] Other Clinician: Referring Yuriel Lopezmartinez: Treating Hakiem Malizia/Extender: Genice Rouge., [name] in Treatment: 14 Visit Information History Since Last Visit Added or deleted any medications: No Patient Arrived: Ambulatory Any new allergies or adverse reactions: No Arrival Time: 07:45 Had a fall or experienced change in No Accompanied By: self activities of daily living that may affect Transfer Assistance: None risk of falls: Patient Identification Verified: Yes Signs or symptoms of abuse/neglect since last visito No Secondary Verification Process Completed: Yes Hospitalized since last visit: No Patient Requires Transmission-Based Precautions: No Implantable device outside of the clinic excluding No Patient Has Alerts: No cellular tissue based products placed in the center since last visit: Has Dressing in Place as Prescribed: Yes Pain Present Now: No Electronic Signature(s) Signed: 04/12/2022 8:21:34 AM By: [name] Entered By: [name] on 04/12/2022 08:04:49 -------------------------------------------------------------------------------- Clinic Level of Care Assessment Details Patient Name: Date of Service: [name] 04/12/2022 7:45 A M Medical Record Number: [id number] Patient Account Number: [id number] Date of Birth/Sex: Treating RN: Oct 23, 1950 (72 y.o. [name], [name] Primary Care Valmore Arabie: [name], [name] Other Clinician: Referring Senai Ramnath: Treating Verdell Dykman/Extender: Genice Rouge., [name] in Treatment: 14 Clinic Level of  Care Assessment Items TOOL 4 Quantity Score X- 1 0 Use when only an EandM is performed on FOLLOW-UP visit ASSESSMENTS - Nursing Assessment / Reassessment X- 1 10 Reassessment of Co-morbidities (includes updates in patient status) X- 1 5 Reassessment of Adherence to Treatment Plan ASSESSMENTS - Wound and Skin A ssessment / Reassessment X - Simple Wound Assessment / Reassessment - one wound 1 5 '[]'$  - 0 Complex Wound Assessment / Reassessment - multiple wounds '[]'$  - 0 Dermatologic / Skin Assessment (not related to wound area) ASSESSMENTS - Focused Assessment X- 1 5 Circumferential Edema Measurements - multi extremities '[]'$  - 0 Nutritional Assessment / Counseling / Intervention LUAN, URBANI Cardenas ([id number]) [id number] Page 2 of 5 '[]'$  - 0 Lower Extremity Assessment (monofilament, tuning fork, pulses) '[]'$  - 0 Peripheral Arterial Disease Assessment (using hand held doppler) ASSESSMENTS - Ostomy and/or Continence Assessment and Care '[]'$  - 0 Incontinence Assessment and Management '[]'$  - 0 Ostomy Care Assessment and Management (repouching, etc.) PROCESS - Coordination of Care X - Simple Patient / Family Education for ongoing care 1 15 '[]'$  - 0 Complex (extensive) Patient / Family Education for ongoing care X- 1 10 Staff obtains Programmer, systems, Records, T Results / Process Orders est '[]'$  - 0 Staff telephones HHA, Nursing Homes / Clarify orders / etc '[]'$  - 0 Routine Transfer to another Facility (non-emergent condition) '[]'$  - 0 Routine Hospital Admission (non-emergent condition) '[]'$  - 0 New Admissions / Biomedical engineer / Ordering NPWT Apligraf, etc. , '[]'$  - 0 Emergency Hospital Admission (emergent condition) X- 1 10 Simple Discharge Coordination '[]'$  - 0 Complex (extensive) Discharge Coordination PROCESS - Special Needs '[]'$  - 0 Pediatric / Minor Patient Management '[]'$  - 0 Isolation Patient Management '[]'$  - 0 Hearing / Language / Visual special needs '[]'$  -  0 Assessment of Community assistance (transportation, D/C planning, etc.) '[]'$  - 0 Additional assistance / Altered mentation '[]'$  - 0 Support Surface(s) Assessment (bed, cushion, seat, etc.) INTERVENTIONS - Wound Cleansing /  Measurement X - Simple Wound Cleansing - one wound 1 5 '[]'$  - 0 Complex Wound Cleansing - multiple wounds X- 1 5 Wound Imaging (photographs - any number of wounds) '[]'$  - 0 Wound Tracing (instead of photographs) X- 1 5 Simple Wound Measurement - one wound '[]'$  - 0 Complex Wound Measurement - multiple wounds INTERVENTIONS - Wound Dressings X - Small Wound Dressing one or multiple wounds 1 10 '[]'$  - 0 Medium Wound Dressing one or multiple wounds '[]'$  - 0 Large Wound Dressing one or multiple wounds X- 1 5 Application of Medications - topical '[]'$  - 0 Application of Medications - injection INTERVENTIONS - Miscellaneous '[]'$  - 0 External ear exam '[]'$  - 0 Specimen Collection (cultures, biopsies, blood, body fluids, etc.) '[]'$  - 0 Specimen(s) / Culture(s) sent or taken to Lab for analysis '[]'$  - 0 Patient Transfer (multiple staff / Civil Service fast streamer / Similar devices) '[]'$  - 0 Simple Staple / Suture removal (25 or less) '[]'$  - 0 Complex Staple / Suture removal (26 or more) '[]'$  - 0 Hypo / Hyperglycemic Management (close monitor of Blood Glucose) MELYSA, SCHROYER Cardenas ([id number]) [id number] Page 3 of 5 '[]'$  - 0 Ankle / Brachial Index (ABI) - do not check if billed separately X- 1 5 Vital Signs Has the patient been seen at the hospital within the last three years: Yes Total Score: 95 Level Of Care: New/Established - Level 3 Electronic Signature(s) Signed: 04/12/2022 8:21:34 AM By: [name] Entered By: [name] on 04/12/2022 08:06:05 -------------------------------------------------------------------------------- Encounter Discharge Information Details Patient Name: Date of Service: Marissa Perking Cardenas. 04/12/2022 7:45 A M Medical Record Number:  [id number] Patient Account Number: [id number] Date of Birth/Sex: Treating RN: 1950/06/08 (72 y.o. [name], [name] Primary Care Skyeler Smola: [name], [name] Other Clinician: Referring Janetta Vandoren: Treating Bemnet Trovato/Extender: Dalene Seltzer in Treatment: 14 Encounter Discharge Information Items Discharge Condition: Stable Ambulatory Status: Ambulatory Discharge Destination: Home Transportation: Private Auto Accompanied By: self Schedule Follow-up Appointment: Yes Clinical Summary of Care: Patient Declined Electronic Signature(s) Signed: 04/12/2022 8:21:34 AM By: [name] Entered By: [name] on 04/12/2022 08:05:35 -------------------------------------------------------------------------------- Patient/Caregiver Education Details Patient Name: Date of Service: [name] 2/2/2024andnbsp7:45 A M Medical Record Number: [id number] Patient Account Number: [id number] Date of Birth/Gender: Treating RN: 1950-09-19 (72 y.o. [name], [name] Primary Care Physician: [name], [name] Other Clinician: Referring Physician: Treating Physician/Extender: Genice Rouge., [name] in Treatment: 14 Education Assessment Education Provided To: Patient Education Topics Provided Wound/Skin Impairment: Methods: Explain/Verbal Responses: Reinforcements needed, State content correctly [employer]) Signed: 04/12/2022 8:21:34 AM By: [name] Entered By: [name] on 04/12/2022 08:05:25 [name] ([id number]) [id number] Page 4 of 5 -------------------------------------------------------------------------------- Wound Assessment Details Patient Name: Date of Service: [name] 04/12/2022 7:45 A M Medical Record Number: [id number] Patient Account Number: [id number] Date of Birth/Sex: Treating RN: 1950/07/13 (72 y.o. [name], [name] Primary Care  Dayna Alia: [name], [name] Other Clinician: Referring Kathrynne Kulinski: Treating Mabel Unrein/Extender: Genice Rouge., [name] in Treatment: 14 Wound Status Wound Number: 1 Primary Etiology: Pressure Ulcer Wound Location: Sacrum Wound Status: Open Wounding Event: Pressure Injury Date Acquired: 10/09/2021 Weeks Of Treatment: 14 Clustered Wound: No Wound Measurements Length: (cm) 0.6 Width: (cm) 0.5 Depth: (cm) 1 Area: (cm) 0.236 Volume: (cm) 0.236 % Reduction in Area: 33.1% % Reduction in Volume: 4.5% Wound Description Classification: Category/Stage IV Exudate Amount: Medium Exudate Type: Purulent Exudate Color: yellow, brown, green Periwound Skin Texture  Texture Color No Abnormalities Noted: No No Abnormalities Noted: No Moisture No Abnormalities Noted: No Treatment Notes Wound #1 (Sacrum) Cleanser Wound Cleanser Discharge Instruction: Cleanse the wound with wound cleanser prior to applying a clean dressing using gauze sponges, not tissue or cotton balls. Peri-Wound Care Skin Prep Discharge Instruction: Use skin prep as directed Topical Gentamicin Discharge Instruction: As directed by physician Primary Dressing Iodoform packing strip 1/4 (in) Discharge Instruction: Lightly pack as instructed. Please pack lightly into wound and all around the undermining as it is 4cm all around. This needs to be done 3 x a week. Secondary Dressing Zetuvit Plus Silicone Border Dressing 4x4 (in/in) Discharge Instruction: Apply silicone border over primary dressing as directed. Secured With Compression SHELISHA, GAUTIER Cardenas ([id number]) [id number] Page 5 of 5 Compression Stockings Add-Ons Electronic Signature(s) Signed: 04/12/2022 8:21:34 AM By: [name] Entered By: [name] on 04/12/2022 08:05:02 -------------------------------------------------------------------------------- Vitals Details Patient Name: Date of  Service: Marissa Perking Cardenas. 04/12/2022 7:45 A M Medical Record Number: [id number] Patient Account Number: [id number] Date of Birth/Sex: Treating RN: September 14, 1950 (72 y.o. [name], [name] Primary Care Krysia Zahradnik: [name], [name] Other Clinician: Referring Shonn Farruggia: Treating Printice Hellmer/Extender: Dalene Seltzer in Treatment: 14 Vital Signs Time Taken: 07:45 Reference Range: 80 - 120 mg / dl Height (in): 64 Weight (lbs): 97 Body Mass Index (BMI): 16.6 Electronic Signature(s) Signed: 04/12/2022 8:21:34 AM By: [name] Entered By: [name] on 04/12/2022 08:04:55

## 2022-04-12 NOTE — Progress Notes (Signed)
KEM, PARCHER R ([id number]) [id number] Page 1 of 1 Visit Report for 04/12/2022 SuperBill Details Patient Name: Date of Service: [name] 04/12/2022 Medical Record Number: [id number] Patient Account Number: [id number] Date of Birth/Sex: Treating RN: 11-19-1950 (72 y.o. [name], [name] Primary Care Provider: [name], [name] Other Clinician: Referring Provider: Treating Provider/Extender: Dalene Seltzer in Treatment: 14 Diagnosis Coding ICD-10 Codes Code Description M62.81 Muscle weakness (generalized) E43 Unspecified severe protein-calorie malnutrition L89.154 Pressure ulcer of sacral region, stage 4 Facility Procedures CPT4 Code Description Modifier Quantity [id number] 99213 - WOUND CARE VISIT-LEV 3 EST PT 1 Electronic Signature(s) Signed: 04/12/2022 8:21:34 AM By: [name] Signed: 04/12/2022 11:56:15 AM By: Kalman Shan DO Entered By: [name] on 04/12/2022 08:06:10

## 2022-04-12 NOTE — Progress Notes (Signed)
DEVYNE, HAUGER ([id number]) [id number].pdf Page 1 of 7 Visit Report for 04/10/2022 Arrival Information Details Patient Name: Date of Service: [name] 04/10/2022 1:15 PM Medical Record Number: [id number] Patient Account Number: [id number] Date of Birth/Sex: Treating RN: November 18, 1950 (72 y.o. [name], Meta.Reding Primary Care Wanza Szumski: [name], [name] Other Clinician: Referring Debra Calabretta: Treating Rawlins Stuard/Extender: [name] in Treatment: 14 Visit Information History Since Last Visit Added or deleted any medications: No Patient Arrived: Ambulatory Any new allergies or adverse reactions: No Arrival Time: 12:53 Had a fall or experienced change in No Accompanied By: self activities of daily living that may affect Transfer Assistance: None risk of falls: Patient Identification Verified: Yes Signs or symptoms of abuse/neglect since last visito No Secondary Verification Process Completed: Yes Hospitalized since last visit: No Patient Requires Transmission-Based Precautions: No Implantable device outside of the clinic excluding No Patient Has Alerts: No cellular tissue based products placed in the center since last visit: Pain Present Now: No Electronic Signature(s) Signed: 04/11/2022 5:30:52 PM By: Deon Pilling RN, BSN Entered By: Deon Pilling on 04/10/2022 12:54:02 -------------------------------------------------------------------------------- Clinic Level of Care Assessment Details Patient Name: Date of Service: Marissa [name] 04/10/2022 1:15 PM Medical Record Number: [id number] Patient Account Number: [id number] Date of Birth/Sex: Treating RN: 20-Sep-1950 (72 y.o. [name], [name] Primary Care Sherlock Nancarrow: [name], [name] Other Clinician: Referring Aryella Besecker: Treating Matix Henshaw/Extender: [name] in Treatment: 14 Clinic Level of Care Assessment Items TOOL 4 Quantity Score X-  1 0 Use when only an EandM is performed on FOLLOW-UP visit ASSESSMENTS - Nursing Assessment / Reassessment X- 1 10 Reassessment of Marissa-morbidities (includes updates in patient status) X- 1 5 Reassessment of Adherence to Treatment Plan ASSESSMENTS - Wound and Skin A ssessment / Reassessment X - Simple Wound Assessment / Reassessment - one wound 1 5 '[]'$  - 0 Complex Wound Assessment / Reassessment - multiple wounds '[]'$  - 0 Dermatologic / Skin Assessment (not related to wound area) ASSESSMENTS - Focused Assessment '[]'$  - 0 Circumferential Edema Measurements - multi extremities '[]'$  - 0 Nutritional Assessment / Counseling / Intervention '[]'$  - 0 Lower Extremity Assessment (monofilament, tuning fork, pulses) Cardenas, [name] ([id number]) [id number] Page 2 of 7 '[]'$  - 0 Peripheral Arterial Disease Assessment (using hand held doppler) ASSESSMENTS - Ostomy and/or Continence Assessment and Care '[]'$  - 0 Incontinence Assessment and Management '[]'$  - 0 Ostomy Care Assessment and Management (repouching, etc.) PROCESS - Coordination of Care X - Simple Patient / Family Education for ongoing care 1 15 '[]'$  - 0 Complex (extensive) Patient / Family Education for ongoing care X- 1 10 Staff obtains Programmer, systems, Records, T Results / Process Orders est '[]'$  - 0 Staff telephones HHA, Nursing Homes / Clarify orders / etc '[]'$  - 0 Routine Transfer to another Facility (non-emergent condition) '[]'$  - 0 Routine Hospital Admission (non-emergent condition) '[]'$  - 0 New Admissions / Biomedical engineer / Ordering NPWT Apligraf, etc. , '[]'$  - 0 Emergency Hospital Admission (emergent condition) X- 1 10 Simple Discharge Coordination '[]'$  - 0 Complex (extensive) Discharge Coordination PROCESS - Special Needs '[]'$  - 0 Pediatric / Minor Patient Management '[]'$  - 0 Isolation Patient Management '[]'$  - 0 Hearing / Language / Visual special needs '[]'$  - 0 Assessment of Community assistance (transportation,  D/C planning, etc.) '[]'$  - 0 Additional assistance / Altered mentation '[]'$  - 0 Support Surface(s) Assessment (bed, cushion, seat, etc.) INTERVENTIONS - Wound Cleansing / Measurement X - Simple Wound  Cleansing - one wound 1 5 '[]'$  - 0 Complex Wound Cleansing - multiple wounds X- 1 5 Wound Imaging (photographs - any number of wounds) '[]'$  - 0 Wound Tracing (instead of photographs) X- 1 5 Simple Wound Measurement - one wound '[]'$  - 0 Complex Wound Measurement - multiple wounds INTERVENTIONS - Wound Dressings X - Small Wound Dressing one or multiple wounds 1 10 '[]'$  - 0 Medium Wound Dressing one or multiple wounds '[]'$  - 0 Large Wound Dressing one or multiple wounds '[]'$  - 0 Application of Medications - topical '[]'$  - 0 Application of Medications - injection INTERVENTIONS - Miscellaneous '[]'$  - 0 External ear exam '[]'$  - 0 Specimen Collection (cultures, biopsies, blood, body fluids, etc.) '[]'$  - 0 Specimen(s) / Culture(s) sent or taken to Lab for analysis '[]'$  - 0 Patient Transfer (multiple staff / Civil Service fast streamer / Similar devices) '[]'$  - 0 Simple Staple / Suture removal (25 or less) '[]'$  - 0 Complex Staple / Suture removal (26 or more) '[]'$  - 0 Hypo / Hyperglycemic Management (close monitor of Blood Glucose) '[]'$  - 0 Ankle / Brachial Index (ABI) - do not check if billed separately Marissa, VOONG Cardenas ([id number]) [id number] Page 3 of 7 X- 1 5 Vital Signs Has the patient been seen at the hospital within the last three years: Yes Total Score: 85 Level Of Care: New/Established - Level 3 Electronic Signature(s) Signed: 04/12/2022 8:21:34 AM By: [name] Entered By: [name] on 04/10/2022 13:24:42 -------------------------------------------------------------------------------- Encounter Discharge Information Details Patient Name: Date of Service: Marissa [name]. 04/10/2022 1:15 PM Medical Record Number: [id number] Patient Account Number: [id number] Date of  Birth/Sex: Treating RN: 1950/08/22 (72 y.o. [name], [name] Primary Care Angle Karel: [name], [name] Other Clinician: Referring Gerrit Rafalski: Treating Kyona Chauncey/Extender: [name] in Treatment: 14 Encounter Discharge Information Items Discharge Condition: Stable Ambulatory Status: Ambulatory Discharge Destination: Home Transportation: Private Auto Accompanied By: self Schedule Follow-up Appointment: Yes Clinical Summary of Care: Patient Declined Electronic Signature(s) Signed: 04/12/2022 8:21:34 AM By: [name] Entered By: [name] on 04/10/2022 13:27:44 -------------------------------------------------------------------------------- Lower Extremity Assessment Details Patient Name: Date of Service: Marissa [name]. 04/10/2022 1:15 PM Medical Record Number: [id number] Patient Account Number: [id number] Date of Birth/Sex: Treating RN: 09/25/1950 (72 y.o. [name], [name] Primary Care Jerime Arif: [name], [name] Other Clinician: Referring Haru Anspaugh: Treating Jaydence Arnesen/Extender: [name] in Treatment: 14 Electronic Signature(s) Signed: 04/11/2022 5:30:52 PM By: Deon Pilling RN, BSN Entered By: Deon Pilling on 04/10/2022 12:54:31 -------------------------------------------------------------------------------- [place] Details Patient Name: Date of Service: Marissa [name]. 04/10/2022 1:15 PM Medical Record Number: [id number] Patient Account Number: [id number] Date of Birth/Sex: Treating RN: 1951-03-03 (72 y.o. Benjaman Lobe [place], [place] Cardenas ([id number]) [id number] Page 4 of 7 Primary Care Tabita Corbo: [name], [name] Other Clinician: Referring Bethanie Bloxom: Treating Dariya Gainer/Extender: [name] in Treatment: 14 Active Inactive Pain, Acute or Chronic Nursing Diagnoses: Pain, acute or chronic: actual or  potential Potential alteration in comfort, pain Goals: Patient will verbalize adequate pain control and receive pain control interventions during procedures as needed Date Initiated: 01/02/2022 Target Resolution Date: 04/13/2022 Goal Status: Active Patient/caregiver will verbalize adequate pain control between visits Date Initiated: 01/02/2022 Target Resolution Date: 04/13/2022 Goal Status: Active Interventions: Encourage patient to take pain medications as prescribed Provide education on pain management Reposition patient for comfort Treatment Activities: Administer pain control measures as ordered : 01/02/2022 Notes: Pressure Nursing  Diagnoses: Knowledge deficit related to management of pressures ulcers Goals: Patient/caregiver will verbalize risk factors for pressure ulcer development Date Initiated: 01/02/2022 Target Resolution Date: 04/13/2022 Goal Status: Active Interventions: Assess: immobility, friction, shearing, incontinence upon admission and as needed Assess offloading mechanisms upon admission and as needed Provide education on pressure ulcers Treatment Activities: T ordered outside of clinic : 01/02/2022 est Notes: Electronic Signature(s) Signed: 04/12/2022 8:21:34 AM By: [name] Entered By: [name] on 04/10/2022 13:19:36 -------------------------------------------------------------------------------- Pain Assessment Details Patient Name: Date of Service: Marissa [name]. 04/10/2022 1:15 PM Medical Record Number: [id number] Patient Account Number: [id number] Date of Birth/Sex: Treating RN: [birth year] (72 y.o. [name], Meta.Reding Primary Care Terrilee Dudzik: [name], [name] Other Clinician: Referring Kiante Ciavarella: Treating Conrad Zajkowski/Extender: [name] in Treatment: 14 Active Problems Location of Pain Severity and Description of Pain MARYURI, WARNKE Cardenas ([id number]) [phone].pdf Page 5 of  7 Patient Has Paino No Site Locations Pain Management and Medication Current Pain Management: Medication: No Cold Application: No Rest: No Massage: No Activity: No T.E.N.S.: No Heat Application: No Leg drop or elevation: No Is the Current Pain Management Adequate: Adequate How does your wound impact your activities of daily livingo Sleep: No Bathing: No Appetite: No Relationship With Others: No Bladder Continence: No Emotions: No Bowel Continence: No Work: No Toileting: No Drive: No Dressing: No Hobbies: No Engineer, maintenance) Signed: 04/11/2022 5:30:52 PM By: Deon Pilling RN, BSN Entered By: Deon Pilling on 04/10/2022 12:54:27 -------------------------------------------------------------------------------- Patient/Caregiver Education Details Patient Name: Date of Service: [name] 1/31/2024andnbsp1:15 PM Medical Record Number: [id number] Patient Account Number: [id number] Date of Birth/Gender: Treating RN: 08/27/1950 (71 y.o. [name], [name] Primary Care Physician: [name], [name] Other Clinician: Referring Physician: Treating Physician/Extender: [name] in Treatment: 14 Education Assessment Education Provided To: Patient Education Topics Provided Wound/Skin Impairment: Methods: Explain/Verbal Responses: Reinforcements needed, State content correctly [employer]) Signed: 04/12/2022 8:21:34 AM By: [name] Entered By: [name] on 04/10/2022 13:19:47 [name] ([id number]) [id number] Page 6 of 7 -------------------------------------------------------------------------------- Wound Assessment Details Patient Name: Date of Service: [name] 04/10/2022 1:15 PM Medical Record Number: [id number] Patient Account Number: [id number] Date of Birth/Sex: Treating RN: September 04, 1950 (72 y.o. [name], Meta.Reding Primary Care Lakin Romer: [name],  [name] Other Clinician: Referring Taren Dymek: Treating Dylin Ihnen/Extender: [name] in Treatment: 14 Wound Status Wound Number: 1 Primary Etiology: Pressure Ulcer Wound Location: Sacrum Wound Status: Open Wounding Event: Pressure Injury Comorbid History: Colitis Date Acquired: 10/09/2021 Weeks Of Treatment: 14 Clustered Wound: No Photos Wound Measurements Length: (cm) 0 Width: (cm) 0 Depth: (cm) 1 Area: (cm) Volume: (cm) .6 % Reduction in Area: 33.1% .5 % Reduction in Volume: 4.5% Epithelialization: Small (1-33%) 0.236 Tunneling: No 0.236 Undermining: Yes Starting Position (o'clock): 12 Ending Position (o'clock): 12 Maximum Distance: (cm) 2 Wound Description Classification: Category/Stage IV Wound Margin: Distinct, outline attached Exudate Amount: Medium Exudate Type: Purulent Exudate Color: yellow, brown, green Foul Odor After Cleansing: No Slough/Fibrino No Wound Bed Granulation Amount: Large (67-100%) Exposed Structure Granulation Quality: Red, Pink Fascia Exposed: No Necrotic Amount: None Present (0%) Fat Layer (Subcutaneous Tissue) Exposed: Yes Tendon Exposed: No Muscle Exposed: No Joint Exposed: No Bone Exposed: No Periwound Skin Texture Texture Color No Abnormalities Noted: No No Abnormalities Noted: No Callus: No Atrophie Blanche: No Crepitus: No Cyanosis: No Excoriation: No Ecchymosis: No Induration: No Erythema: No Rash: No Hemosiderin Staining: No Scarring:  No Mottled: No SHEVAUN, LOVAN Cardenas ([id number]) [phone].pdf Page 7 of 7 Pallor: No Moisture Rubor: No No Abnormalities Noted: No Dry / Scaly: No Maceration: No Treatment Notes Wound #1 (Sacrum) Cleanser Wound Cleanser Discharge Instruction: Cleanse the wound with wound cleanser prior to applying a clean dressing using gauze sponges, not tissue or cotton balls. Peri-Wound Care Skin Prep Discharge Instruction: Use skin prep  as directed Topical Gentamicin Discharge Instruction: As directed by physician Primary Dressing Iodoform packing strip 1/4 (in) Discharge Instruction: Lightly pack as instructed. Please pack lightly into wound and all around the undermining as it is 4cm all around. This needs to be done 3 x a week. Secondary Dressing Zetuvit Plus Silicone Border Dressing 4x4 (in/in) Discharge Instruction: Apply silicone border over primary dressing as directed. Secured With Compression Wrap Compression Stockings Environmental education officer) Signed: 04/11/2022 5:30:52 PM By: Deon Pilling RN, BSN Entered By: Deon Pilling on 04/10/2022 13:00:17 -------------------------------------------------------------------------------- Vitals Details Patient Name: Date of Service: Marissa [name]. 04/10/2022 1:15 PM Medical Record Number: [id number] Patient Account Number: [id number] Date of Birth/Sex: Treating RN: 1950/11/03 (72 y.o. [name], Meta.Reding Primary Care Kordae Buonocore: [name], [name] Other Clinician: Referring Laurice Kimmons: Treating Natalio Salois/Extender: [name] in Treatment: 14 Vital Signs Time Taken: 12:52 Temperature (F): 97.9 Height (in): 64 Pulse (bpm): 72 Weight (lbs): 97 Respiratory Rate (breaths/min): 16 Body Mass Index (BMI): 16.6 Blood Pressure (mmHg): 101/66 Reference Range: 80 - 120 mg / dl Electronic Signature(s) Signed: 04/11/2022 5:30:52 PM By: Deon Pilling RN, BSN Entered By: Deon Pilling on 04/10/2022 12:54:19

## 2022-04-15 ENCOUNTER — Encounter (HOSPITAL_BASED_OUTPATIENT_CLINIC_OR_DEPARTMENT_OTHER): Payer: 59 | Admitting: Internal Medicine

## 2022-04-15 DIAGNOSIS — M6281 Muscle weakness (generalized): Secondary | ICD-10-CM | POA: Diagnosis not present

## 2022-04-15 DIAGNOSIS — R002 Palpitations: Secondary | ICD-10-CM | POA: Diagnosis not present

## 2022-04-15 DIAGNOSIS — L89154 Pressure ulcer of sacral region, stage 4: Secondary | ICD-10-CM | POA: Diagnosis not present

## 2022-04-15 DIAGNOSIS — E43 Unspecified severe protein-calorie malnutrition: Secondary | ICD-10-CM | POA: Diagnosis not present

## 2022-04-17 ENCOUNTER — Encounter (HOSPITAL_BASED_OUTPATIENT_CLINIC_OR_DEPARTMENT_OTHER): Payer: 59 | Admitting: Internal Medicine

## 2022-04-17 DIAGNOSIS — L89154 Pressure ulcer of sacral region, stage 4: Secondary | ICD-10-CM | POA: Diagnosis not present

## 2022-04-17 DIAGNOSIS — E43 Unspecified severe protein-calorie malnutrition: Secondary | ICD-10-CM | POA: Diagnosis not present

## 2022-04-17 DIAGNOSIS — M6281 Muscle weakness (generalized): Secondary | ICD-10-CM | POA: Diagnosis not present

## 2022-04-17 NOTE — Progress Notes (Signed)
KIMMIE, BERGGREN R ([id number]) [id number] Page 1 of 1 Visit Report for 04/15/2022 SuperBill Details Patient Name: Date of Service: [name] 04/15/2022 Medical Record Number: [id number] Patient Account Number: [id number] Date of Birth/Sex: Treating RN: 03-Aug-1950 (72 y.o. [name], [name] Primary Care Provider: [name], [name] Other Clinician: Referring Provider: Treating Provider/Extender: Dalene Seltzer in Treatment: 14 Diagnosis Coding ICD-10 Codes Code Description M62.81 Muscle weakness (generalized) E43 Unspecified severe protein-calorie malnutrition L89.154 Pressure ulcer of sacral region, stage 4 Facility Procedures CPT4 Code Description Modifier Quantity [id number] - WOUND CARE VISIT-LEV 2 EST PT 1 Electronic Signature(s) Signed: 04/15/2022 9:08:23 AM By: Kalman Shan DO Signed: 04/16/2022 6:17:04 PM By: Deon Pilling RN, BSN Entered By: Deon Pilling on 04/15/2022 08:34:04

## 2022-04-17 NOTE — Progress Notes (Signed)
KHALANI, NOVOA R ([id number]) [id number] Page 1 of 4 Visit Report for 04/15/2022 Arrival Information Details Patient Name: Date of Service: [name] 04/15/2022 7:45 A M Medical Record Number: [id number] Patient Account Number: [id number] Date of Birth/Sex: Treating RN: 07-Sep-1950 (72 y.o. [name], Meta.Reding Primary Care Vitaliy Eisenhour: [name], [name] Other Clinician: Referring Tracina Beaumont: Treating Ashland Osmer/Extender: Dalene Seltzer in Treatment: 14 Visit Information History Since Last Visit Added or deleted any medications: No Patient Arrived: Ambulatory Any new allergies or adverse reactions: No Arrival Time: 07:45 Had a fall or experienced change in No Accompanied By: self activities of daily living that may affect Transfer Assistance: None risk of falls: Patient Identification Verified: Yes Signs or symptoms of abuse/neglect since last visito No Secondary Verification Process Completed: Yes Hospitalized since last visit: No Patient Requires Transmission-Based Precautions: No Implantable device outside of the clinic excluding No Patient Has Alerts: No cellular tissue based products placed in the center since last visit: Has Dressing in Place as Prescribed: Yes Pain Present Now: No Electronic Signature(s) Signed: 04/16/2022 6:17:04 PM By: Deon Pilling RN, BSN Entered By: Deon Pilling on 04/15/2022 08:32:21 -------------------------------------------------------------------------------- Clinic Level of Care Assessment Details Patient Name: Date of Service: [name] 04/15/2022 7:45 A M Medical Record Number: [id number] Patient Account Number: [id number] Date of Birth/Sex: Treating RN: 09-04-1950 (72 y.o. [name], [name] Primary Care Elinda Bunten: [name], [name] Other Clinician: Referring Merrie Epler: Treating Averey Koning/Extender: Genice Rouge., [name] in Treatment: 14 Clinic Level of Care  Assessment Items TOOL 4 Quantity Score X- 1 0 Use when only an EandM is performed on FOLLOW-UP visit ASSESSMENTS - Nursing Assessment / Reassessment X- 1 10 Reassessment of Co-morbidities (includes updates in patient status) X- 1 5 Reassessment of Adherence to Treatment Plan ASSESSMENTS - Wound and Skin A ssessment / Reassessment X - Simple Wound Assessment / Reassessment - one wound 1 5 '[]'$  - 0 Complex Wound Assessment / Reassessment - multiple wounds '[]'$  - 0 Dermatologic / Skin Assessment (not related to wound area) ASSESSMENTS - Focused Assessment '[]'$  - 0 Circumferential Edema Measurements - multi extremities '[]'$  - 0 Nutritional Assessment / Counseling / Intervention KLARE, CRISS R ([id number]) [id number] Page 2 of 4 '[]'$  - 0 Lower Extremity Assessment (monofilament, tuning fork, pulses) '[]'$  - 0 Peripheral Arterial Disease Assessment (using hand held doppler) ASSESSMENTS - Ostomy and/or Continence Assessment and Care '[]'$  - 0 Incontinence Assessment and Management '[]'$  - 0 Ostomy Care Assessment and Management (repouching, etc.) PROCESS - Coordination of Care X - Simple Patient / Family Education for ongoing care 1 15 '[]'$  - 0 Complex (extensive) Patient / Family Education for ongoing care X- 1 10 Staff obtains Programmer, systems, Records, T Results / Process Orders est '[]'$  - 0 Staff telephones HHA, Nursing Homes / Clarify orders / etc '[]'$  - 0 Routine Transfer to another Facility (non-emergent condition) '[]'$  - 0 Routine Hospital Admission (non-emergent condition) '[]'$  - 0 New Admissions / Biomedical engineer / Ordering NPWT Apligraf, etc. , '[]'$  - 0 Emergency Hospital Admission (emergent condition) X- 1 10 Simple Discharge Coordination '[]'$  - 0 Complex (extensive) Discharge Coordination PROCESS - Special Needs '[]'$  - 0 Pediatric / Minor Patient Management '[]'$  - 0 Isolation Patient Management '[]'$  - 0 Hearing / Language / Visual special needs '[]'$  -  0 Assessment of Community assistance (transportation, D/C planning, etc.) '[]'$  - 0 Additional assistance / Altered mentation '[]'$  - 0 Support Surface(s) Assessment (bed, cushion, seat, etc.) INTERVENTIONS - Wound  Cleansing / Measurement X - Simple Wound Cleansing - one wound 1 5 '[]'$  - 0 Complex Wound Cleansing - multiple wounds '[]'$  - 0 Wound Imaging (photographs - any number of wounds) '[]'$  - 0 Wound Tracing (instead of photographs) '[]'$  - 0 Simple Wound Measurement - one wound '[]'$  - 0 Complex Wound Measurement - multiple wounds INTERVENTIONS - Wound Dressings X - Small Wound Dressing one or multiple wounds 1 10 '[]'$  - 0 Medium Wound Dressing one or multiple wounds '[]'$  - 0 Large Wound Dressing one or multiple wounds '[]'$  - 0 Application of Medications - topical '[]'$  - 0 Application of Medications - injection INTERVENTIONS - Miscellaneous '[]'$  - 0 External ear exam '[]'$  - 0 Specimen Collection (cultures, biopsies, blood, body fluids, etc.) '[]'$  - 0 Specimen(s) / Culture(s) sent or taken to Lab for analysis '[]'$  - 0 Patient Transfer (multiple staff / Civil Service fast streamer / Similar devices) '[]'$  - 0 Simple Staple / Suture removal (25 or less) '[]'$  - 0 Complex Staple / Suture removal (26 or more) '[]'$  - 0 Hypo / Hyperglycemic Management (close monitor of Blood Glucose) MARLYSS, CISSELL R ([id number]) [id number] Page 3 of 4 '[]'$  - 0 Ankle / Brachial Index (ABI) - do not check if billed separately '[]'$  - 0 Vital Signs Has the patient been seen at the hospital within the last three years: Yes Total Score: 70 Level Of Care: New/Established - Level 2 Electronic Signature(s) Signed: 04/16/2022 6:17:04 PM By: Deon Pilling RN, BSN Entered By: Deon Pilling on 04/15/2022 08:33:52 -------------------------------------------------------------------------------- Encounter Discharge Information Details Patient Name: Date of Service: [name], [place]. 04/15/2022 7:45 A M Medical Record Number:  [id number] Patient Account Number: [id number] Date of Birth/Sex: Treating RN: May 07, 1950 (72 y.o. [name], Meta.Reding Primary Care Sylva Overley: [name], [name] Other Clinician: Referring Valmai Vandenberghe: Treating Ashtin Rosner/Extender: Genice Rouge., [name] in Treatment: 14 Encounter Discharge Information Items Discharge Condition: Stable Ambulatory Status: Ambulatory Discharge Destination: Home Transportation: Private Auto Accompanied By: self Schedule Follow-up Appointment: Yes Clinical Summary of Care: Electronic Signature(s) Signed: 04/16/2022 6:17:04 PM By: Deon Pilling RN, BSN Entered By: Deon Pilling on 04/15/2022 08:32:59 -------------------------------------------------------------------------------- Wound Assessment Details Patient Name: Date of Service: [name] 04/15/2022 7:45 A M Medical Record Number: [id number] Patient Account Number: [id number] Date of Birth/Sex: Treating RN: 1951/01/06 (72 y.o. [name], Meta.Reding Primary Care Marshayla Mitschke: [name], [name] Other Clinician: Referring Lyvonne Cassell: Treating Seleena Reimers/Extender: Dalene Seltzer in Treatment: 14 Wound Status Wound Number: 1 Primary Etiology: Pressure Ulcer Wound Location: Sacrum Wound Status: Open Wounding Event: Pressure Injury Date Acquired: 10/09/2021 Weeks Of Treatment: 14 Clustered Wound: No Wound Measurements Length: (cm) 0.6 Width: (cm) 0.5 Depth: (cm) 1 Area: (cm) 0.236 Volume: (cm) 0.236 % Reduction in Area: 33.1% % Reduction in Volume: 4.5% Wound Description GLADIOLA, MADORE R ([id number]) Classification: Category/Stage IV Exudate Amount: Medium Exudate Type: Purulent Exudate Color: yellow, brown, green [id number] Page 4 of 4 Periwound Skin Texture Texture Color No Abnormalities Noted: No No Abnormalities Noted: No Moisture No Abnormalities Noted: No Treatment Notes Wound #1 (Sacrum) Cleanser Wound  Cleanser Discharge Instruction: Cleanse the wound with wound cleanser prior to applying a clean dressing using gauze sponges, not tissue or cotton balls. Peri-Wound Care Skin Prep Discharge Instruction: Use skin prep as directed Topical Gentamicin Discharge Instruction: As directed by physician Primary Dressing Iodoform packing strip 1/4 (in) Discharge Instruction: Lightly pack as instructed. Please pack lightly into wound and all around the undermining as it is 4cm  all around. This needs to be done 3 x a week. Secondary Dressing Zetuvit Plus Silicone Border Dressing 4x4 (in/in) Discharge Instruction: Apply silicone border over primary dressing as directed. Secured With Compression Wrap Compression Stockings Environmental education officer) Signed: 04/16/2022 6:17:04 PM By: Deon Pilling RN, BSN Entered By: Deon Pilling on 04/15/2022 08:32:34

## 2022-04-19 ENCOUNTER — Encounter (HOSPITAL_BASED_OUTPATIENT_CLINIC_OR_DEPARTMENT_OTHER): Payer: 59 | Admitting: Internal Medicine

## 2022-04-19 DIAGNOSIS — E2740 Unspecified adrenocortical insufficiency: Secondary | ICD-10-CM | POA: Diagnosis not present

## 2022-04-19 DIAGNOSIS — M6281 Muscle weakness (generalized): Secondary | ICD-10-CM | POA: Diagnosis not present

## 2022-04-19 DIAGNOSIS — Z79899 Other long term (current) drug therapy: Secondary | ICD-10-CM | POA: Diagnosis not present

## 2022-04-19 DIAGNOSIS — F32A Depression, unspecified: Secondary | ICD-10-CM | POA: Diagnosis not present

## 2022-04-19 DIAGNOSIS — R030 Elevated blood-pressure reading, without diagnosis of hypertension: Secondary | ICD-10-CM | POA: Diagnosis not present

## 2022-04-19 DIAGNOSIS — J449 Chronic obstructive pulmonary disease, unspecified: Secondary | ICD-10-CM | POA: Diagnosis not present

## 2022-04-19 DIAGNOSIS — K861 Other chronic pancreatitis: Secondary | ICD-10-CM | POA: Diagnosis not present

## 2022-04-19 DIAGNOSIS — Z681 Body mass index (BMI) 19 or less, adult: Secondary | ICD-10-CM | POA: Diagnosis not present

## 2022-04-19 DIAGNOSIS — E43 Unspecified severe protein-calorie malnutrition: Secondary | ICD-10-CM | POA: Diagnosis not present

## 2022-04-19 DIAGNOSIS — G4700 Insomnia, unspecified: Secondary | ICD-10-CM | POA: Diagnosis not present

## 2022-04-19 DIAGNOSIS — M542 Cervicalgia: Secondary | ICD-10-CM | POA: Diagnosis not present

## 2022-04-19 DIAGNOSIS — L89154 Pressure ulcer of sacral region, stage 4: Secondary | ICD-10-CM | POA: Diagnosis not present

## 2022-04-19 DIAGNOSIS — R64 Cachexia: Secondary | ICD-10-CM | POA: Diagnosis not present

## 2022-04-19 NOTE — Progress Notes (Signed)
YOELI, MATHENEY Cardenas ([id number]) [id number] Page 1 of 8 Visit Report for 04/17/2022 Arrival Information Details Patient Name: Date of Service: [name] 04/17/2022 12:30 PM Medical Record Number: [id number] Patient Account Number: [id number] Date of Birth/Sex: Treating RN: 02/03/1951 (72 y.o. [name] Primary Care Kelvon Giannini: [name], [name] Other Clinician: Referring Vanna Shavers: Treating Ascencion Coye/Extender: Ky Barban., [name] in Treatment: 15 Visit Information History Since Last Visit Added or deleted any medications: No Patient Arrived: Ambulatory Any new allergies or adverse reactions: No Arrival Time: 12:34 Had a fall or experienced change in No Accompanied By: self activities of daily living that may affect Transfer Assistance: None risk of falls: Patient Identification Verified: Yes Signs or symptoms of abuse/neglect since last visito No Secondary Verification Process Completed: Yes Hospitalized since last visit: No Patient Requires Transmission-Based Precautions: No Implantable device outside of the clinic excluding No Patient Has Alerts: No cellular tissue based products placed in the center since last visit: Has Dressing in Place as Prescribed: Yes Pain Present Now: No Electronic Signature(s) Signed: 04/18/2022 5:19:15 PM By: Sharyn Creamer RN, BSN Entered By: Sharyn Creamer on 04/17/2022 12:35:13 -------------------------------------------------------------------------------- Clinic Level of Care Assessment Details Patient Name: Date of Service: Marissa [name] 04/17/2022 12:30 PM Medical Record Number: [id number] Patient Account Number: [id number] Date of Birth/Sex: Treating RN: 06-24-1950 (71 y.o. [name] Primary Care Celesta Funderburk: [name], [name] Other Clinician: Referring Rozella Servello: Treating Zabdi Mis/Extender: Ky Barban., [name] in Treatment: 15 Clinic Level of Care  Assessment Items TOOL 4 Quantity Score X- 1 0 Use when only an EandM is performed on FOLLOW-UP visit ASSESSMENTS - Nursing Assessment / Reassessment X- 1 10 Reassessment of Marissa-morbidities (includes updates in patient status) X- 1 5 Reassessment of Adherence to Treatment Plan ASSESSMENTS - Wound and Skin A ssessment / Reassessment X - Simple Wound Assessment / Reassessment - one wound 1 5 []$  - 0 Complex Wound Assessment / Reassessment - multiple wounds []$  - 0 Dermatologic / Skin Assessment (not related to wound area) ASSESSMENTS - Focused Assessment []$  - 0 Circumferential Edema Measurements - multi extremities []$  - 0 Nutritional Assessment / Counseling / Intervention [name], [name] ([id number] Page 2 of 8 []$  - 0 Lower Extremity Assessment (monofilament, tuning fork, pulses) []$  - 0 Peripheral Arterial Disease Assessment (using hand held doppler) ASSESSMENTS - Ostomy and/or Continence Assessment and Care []$  - 0 Incontinence Assessment and Management []$  - 0 Ostomy Care Assessment and Management (repouching, etc.) PROCESS - Coordination of Care X - Simple Patient / Family Education for ongoing care 1 15 []$  - 0 Complex (extensive) Patient / Family Education for ongoing care X- 1 10 Staff obtains Programmer, systems, Records, T Results / Process Orders est X- 1 10 Staff telephones HHA, Nursing Homes / Clarify orders / etc []$  - 0 Routine Transfer to another Facility (non-emergent condition) []$  - 0 Routine Hospital Admission (non-emergent condition) []$  - 0 New Admissions / Biomedical engineer / Ordering NPWT Apligraf, etc. , []$  - 0 Emergency Hospital Admission (emergent condition) []$  - 0 Simple Discharge Coordination []$  - 0 Complex (extensive) Discharge Coordination PROCESS - Special Needs []$  - 0 Pediatric / Minor Patient Management []$  - 0 Isolation Patient Management []$  - 0 Hearing / Language / Visual special needs []$  -  0 Assessment of Community assistance (transportation, D/C planning, etc.) []$  - 0 Additional assistance / Altered mentation []$  - 0 Support Surface(s) Assessment (bed, cushion, seat, etc.) INTERVENTIONS - Wound Cleansing /  Measurement X - Simple Wound Cleansing - one wound 1 5 []$  - 0 Complex Wound Cleansing - multiple wounds X- 1 5 Wound Imaging (photographs - any number of wounds) []$  - 0 Wound Tracing (instead of photographs) X- 1 5 Simple Wound Measurement - one wound []$  - 0 Complex Wound Measurement - multiple wounds INTERVENTIONS - Wound Dressings X - Small Wound Dressing one or multiple wounds 1 10 []$  - 0 Medium Wound Dressing one or multiple wounds []$  - 0 Large Wound Dressing one or multiple wounds X- 1 5 Application of Medications - topical []$  - 0 Application of Medications - injection INTERVENTIONS - Miscellaneous []$  - 0 External ear exam []$  - 0 Specimen Collection (cultures, biopsies, blood, body fluids, etc.) []$  - 0 Specimen(s) / Culture(s) sent or taken to Lab for analysis []$  - 0 Patient Transfer (multiple staff / Civil Service fast streamer / Similar devices) []$  - 0 Simple Staple / Suture removal (25 or less) []$  - 0 Complex Staple / Suture removal (26 or more) []$  - 0 Hypo / Hyperglycemic Management (close monitor of Blood Glucose) [name], [name] ([id number] Page 3 of 8 []$  - 0 Ankle / Brachial Index (ABI) - do not check if billed separately X- 1 5 Vital Signs Has the patient been seen at the hospital within the last three years: Yes Total Score: 90 Level Of Care: New/Established - Level 3 Electronic Signature(s) Signed: 04/18/2022 5:19:15 PM By: Sharyn Creamer RN, BSN Entered By: Sharyn Creamer on 04/17/2022 16:11:36 -------------------------------------------------------------------------------- Encounter Discharge Information Details Patient Name: Date of Service: Marissa Marissa Compton Cardenas. 04/17/2022 12:30 PM Medical Record Number:  [id number] Patient Account Number: [id number] Date of Birth/Sex: Treating RN: 1950-04-10 (72 y.o. [name] Primary Care Kristalynn Coddington: [name], [name] Other Clinician: Referring Laquan Beier: Treating Lauralyn Shadowens/Extender: Ky Barban., [name] in Treatment: 15 Encounter Discharge Information Items Discharge Condition: Stable Ambulatory Status: Ambulatory Discharge Destination: Home Transportation: Private Auto Accompanied By: self Schedule Follow-up Appointment: Yes Clinical Summary of Care: Patient Declined Electronic Signature(s) Signed: 04/18/2022 5:19:15 PM By: Sharyn Creamer RN, BSN Entered By: Sharyn Creamer on 04/17/2022 16:10:43 -------------------------------------------------------------------------------- Lower Extremity Assessment Details Patient Name: Date of Service: Marissa Marissa Compton Cardenas. 04/17/2022 12:30 PM Medical Record Number: [id number] Patient Account Number: [id number] Date of Birth/Sex: Treating RN: 1950/10/08 (72 y.o. [name] Primary Care Terence Googe: [name], [name] Other Clinician: Referring Dannel Rafter: Treating Tosha Belgarde/Extender: Ky Barban., [name] in Treatment: 15 Electronic Signature(s) Signed: 04/18/2022 5:19:15 PM By: Sharyn Creamer RN, BSN Entered By: Sharyn Creamer on 04/17/2022 12:35:45 -------------------------------------------------------------------------------- Multi Wound Chart Details Patient Name: Date of Service: Marissa Perking Cardenas. 04/17/2022 12:30 PM Medical Record Number: [id number] Patient Account Number: [id number] [name], [name] ([id number]) [id number] Page 4 of 8 Date of Birth/Sex: Treating RN: [birth year] (72 y.o. F) Primary Care Benny Henrie: Other Clinician: [name], [name] Referring Lissett Favorite: Treating Kody Vigil/Extender: Ky Barban., [name] in Treatment: 15 Vital Signs Height(in): 64 Pulse(bpm): 93 Weight(lbs): 62 Blood  Pressure(mmHg): 105/71 Body Mass Index(BMI): 16.6 Temperature(F): 98.5 Respiratory Rate(breaths/min): 18 [1:Photos:] [N/A:N/A] Sacrum N/A N/A Wound Location: Pressure Injury N/A N/A Wounding Event: Pressure Ulcer N/A N/A Primary Etiology: Colitis N/A N/A Comorbid History: 10/09/2021 N/A N/A Date Acquired: 15 N/A N/A Weeks of Treatment: Open N/A N/A Wound Status: No N/A N/A Wound Recurrence: 0.4x0.4x1.2 N/A N/A Measurements L x W x D (cm) 0.126 N/A N/A A (cm) : rea 0.151 N/A N/A Volume (cm) : 64.30% N/A N/A % Reduction  in A rea: 38.90% N/A N/A % Reduction in Volume: 7 Starting Position 1 (o'clock): 4 Ending Position 1 (o'clock): 2.5 Maximum Distance 1 (cm): Yes N/A N/A Undermining: Category/Stage IV N/A N/A Classification: Medium N/A N/A Exudate A mount: Purulent N/A N/A Exudate Type: yellow, brown, green N/A N/A Exudate Color: Large (67-100%) N/A N/A Granulation A mount: Small (1-33%) N/A N/A Necrotic A mount: Medium (34-66%) N/A N/A Epithelialization: Treatment Notes Electronic Signature(s) Signed: 04/17/2022 3:38:34 PM By: Linton Ham MD Entered By: Linton Ham on 04/17/2022 12:57:14 -------------------------------------------------------------------------------- Multi-Disciplinary Care Plan Details Patient Name: Date of Service: Marissa Perking Cardenas. 04/17/2022 12:30 PM Medical Record Number: [id number] Patient Account Number: [id number] Date of Birth/Sex: Treating RN: 1950/10/09 (72 y.o. [name] Primary Care Merisa Julio: [name], [name] Other Clinician: Referring Khloi Rawl: Treating Gohan Collister/Extender: Ky Barban., [name] in Treatment: [address] [name], [name] ([id number]) [id number] Page 5 of 8 Pain, Acute or Chronic Nursing Diagnoses: Pain, acute or chronic: actual or potential Potential alteration in comfort, pain Goals: Patient will verbalize adequate pain control  and receive pain control interventions during procedures as needed Date Initiated: 01/02/2022 Target Resolution Date: 04/13/2022 Goal Status: Active Patient/caregiver will verbalize adequate pain control between visits Date Initiated: 01/02/2022 Target Resolution Date: 04/13/2022 Goal Status: Active Interventions: Encourage patient to take pain medications as prescribed Provide education on pain management Reposition patient for comfort Treatment Activities: Administer pain control measures as ordered : 01/02/2022 Notes: Pressure Nursing Diagnoses: Knowledge deficit related to management of pressures ulcers Goals: Patient/caregiver will verbalize risk factors for pressure ulcer development Date Initiated: 01/02/2022 Target Resolution Date: 04/13/2022 Goal Status: Active Interventions: Assess: immobility, friction, shearing, incontinence upon admission and as needed Assess offloading mechanisms upon admission and as needed Provide education on pressure ulcers Treatment Activities: T ordered outside of clinic : 01/02/2022 est Notes: Electronic Signature(s) Signed: 04/18/2022 5:19:15 PM By: Sharyn Creamer RN, BSN Entered By: Sharyn Creamer on 04/17/2022 12:42:53 -------------------------------------------------------------------------------- Pain Assessment Details Patient Name: Date of Service: Marissa Perking Cardenas. 04/17/2022 12:30 PM Medical Record Number: [id number] Patient Account Number: [id number] Date of Birth/Sex: Treating RN: [birth year] (72 y.o. [name] Primary Care Chequita Mofield: [name], [name] Other Clinician: Referring Gearldean Lomanto: Treating Toryn Dewalt/Extender: Ky Barban., [name] in Treatment: 15 Active Problems Location of Pain Severity and Description of Pain Patient Has Paino No Site Locations [place] Cardenas ([id number]) [id number] Page 6 of 8 Pain Management and Medication Current Pain Management: Electronic  Signature(s) Signed: 04/18/2022 5:19:15 PM By: Sharyn Creamer RN, BSN Entered By: Sharyn Creamer on 04/17/2022 12:35:38 -------------------------------------------------------------------------------- Patient/Caregiver Education Details Patient Name: Date of Service: [name] 2/7/2024andnbsp12:30 PM Medical Record Number: [id number] Patient Account Number: [id number] Date of Birth/Gender: Treating RN: [birth year] (72 y.o. [name] Primary Care Physician: [name], [name] Other Clinician: Referring Physician: Treating Physician/Extender: Ky Barban., [name] in Treatment: 15 Education Assessment Education Provided To: Patient Education Topics Provided Wound/Skin Impairment: Methods: Explain/Verbal Responses: State content correctly [employer]) Signed: 04/18/2022 5:19:15 PM By: Sharyn Creamer RN, BSN Entered By: Sharyn Creamer on 04/17/2022 12:43:15 -------------------------------------------------------------------------------- Wound Assessment Details Patient Name: Date of Service: Marissa Marissa Compton Cardenas. 04/17/2022 12:30 PM Medical Record Number: [id number] Patient Account Number: [id number] Date of Birth/Sex: Treating RN: 1950-10-22 (72 y.o. [name] Primary Care Deandra Gadson: [name], [name] Other Clinician: Referring Broughton Eppinger: Treating Hema Lanza/Extender: Layaan, [place], [place] Cardenas ([id number]) [id number] Page 7 of 8 Weeks in Treatment: 15  Wound Status Wound Number: 1 Primary Etiology: Pressure Ulcer Wound Location: Sacrum Wound Status: Open Wounding Event: Pressure Injury Comorbid History: Colitis Date Acquired: 10/09/2021 Weeks Of Treatment: 15 Clustered Wound: No Photos Wound Measurements Length: (cm) 0.4 Width: (cm) 0.4 Depth: (cm) 1.2 Area: (cm) 0.126 Volume: (cm) 0.151 % Reduction in Area: 64.3% % Reduction in Volume: 38.9% Epithelialization: Medium  (34-66%) Tunneling: No Undermining: Yes Starting Position (o'clock): 7 Ending Position (o'clock): 4 Maximum Distance: (cm) 2.5 Wound Description Classification: Category/Stage IV Exudate Amount: Medium Exudate Type: Purulent Exudate Color: yellow, brown, green Wound Bed Granulation Amount: Large (67-100%) Necrotic Amount: Small (1-33%) Necrotic Quality: Adherent Slough Periwound Skin Texture Texture Color No Abnormalities Noted: No No Abnormalities Noted: No Moisture No Abnormalities Noted: No Treatment Notes Wound #1 (Sacrum) Cleanser Wound Cleanser Discharge Instruction: Cleanse the wound with wound cleanser prior to applying a clean dressing using gauze sponges, not tissue or cotton balls. Peri-Wound Care Skin Prep Discharge Instruction: Use skin prep as directed Topical Gentamicin Discharge Instruction: As directed by physician Primary Dressing Iodoform packing strip 1/4 (in) Discharge Instruction: Lightly pack as instructed. Please pack lightly into wound and all around the undermining as it is 4cm all around. This needs to be done 3 x a week. NIKHITA, JEWKES Cardenas ([id number]) [id number] Page 8 of 8 Secondary Dressing Zetuvit Plus Silicone Border Dressing 4x4 (in/in) Discharge Instruction: Apply silicone border over primary dressing as directed. Secured With Compression Wrap Compression Stockings Environmental education officer) Signed: 04/18/2022 5:19:15 PM By: Sharyn Creamer RN, BSN Entered By: Sharyn Creamer on 04/17/2022 12:48:07 -------------------------------------------------------------------------------- [place] Details Patient Name: Date of Service: Marissa Marissa Compton Cardenas. 04/17/2022 12:30 PM Medical Record Number: [id number] Patient Account Number: [id number] Date of Birth/Sex: Treating RN: [birth year] (72 y.o. Ardelle Balls, Morey Hummingbird Primary Care Lorielle Boehning: [name], [name] Other Clinician: Referring Tennelle Taflinger: Treating Alijah Hyde/Extender:  Ky Barban., [name] in Treatment: 15 Vital Signs Time Taken: 12:34 Temperature (F): 98.5 Height (in): 64 Pulse (bpm): 73 Weight (lbs): 97 Respiratory Rate (breaths/min): 18 Body Mass Index (BMI): 16.6 Blood Pressure (mmHg): 105/71 Reference Range: 80 - 120 mg / dl Electronic Signature(s) Signed: 04/18/2022 5:19:15 PM By: Sharyn Creamer RN, BSN Entered By: Sharyn Creamer on 04/17/2022 12:35:32

## 2022-04-20 NOTE — Progress Notes (Signed)
DEBROAH, ASHMAN R ([id number]) [id number] Page 1 of 1 Visit Report for 04/19/2022 SuperBill Details Patient Name: Date of Service: [name] 04/19/2022 Medical Record Number: [id number] Patient Account Number: [id number] Date of Birth/Sex: Treating RN: 03/02/1951 (72 y.o. [name], [name] Primary Care Provider: [name], [name] Other Clinician: Referring Provider: Treating Provider/Extender: Dalene Seltzer in Treatment: 15 Diagnosis Coding ICD-10 Codes Code Description [phone] Pressure ulcer of sacral region, stage 4 M62.81 Muscle weakness (generalized) E43 Unspecified severe protein-calorie malnutrition Facility Procedures CPT4 Code Description Modifier Quantity [id number] [phone] - WOUND CARE VISIT-LEV 3 EST PT 1 Electronic Signature(s) Signed: 04/19/2022 12:26:02 PM By: Kalman Shan DO Signed: 04/19/2022 5:22:46 PM By: Deon Pilling RN, BSN Entered By: Deon Pilling on 04/19/2022 09:00:26

## 2022-04-20 NOTE — Progress Notes (Signed)
CORRI, MASCARI Cardenas ([id number]) [id number] Page 1 of 4 Visit Report for 04/19/2022 Arrival Information Details Patient Name: Date of Service: [name] 04/19/2022 7:45 A M Medical Record Number: [id number] Patient Account Number: [id number] Date of Birth/Sex: Treating RN: [birth year] (72 y.o. [name], Meta.Reding Primary Care Katlin Ciszewski: [name], [name] Other Clinician: Referring Jhon Mallozzi: Treating Starkisha Tullis/Extender: Genice Rouge., [name] in Treatment: 15 Visit Information History Since Last Visit Added or deleted any medications: No Patient Arrived: Ambulatory Any new allergies or adverse reactions: No Arrival Time: 07:35 Had a fall or experienced change in No Accompanied By: self activities of daily living that may affect Transfer Assistance: None risk of falls: Patient Identification Verified: Yes Signs or symptoms of abuse/neglect since last visito No Secondary Verification Process Completed: Yes Hospitalized since last visit: No Patient Requires Transmission-Based Precautions: No Implantable device outside of the clinic excluding No Patient Has Alerts: No cellular tissue based products placed in the center since last visit: Has Dressing in Place as Prescribed: Yes Pain Present Now: No Electronic Signature(s) Signed: 04/19/2022 5:22:46 PM By: Deon Pilling RN, BSN Entered By: Deon Pilling on 04/19/2022 08:59:30 -------------------------------------------------------------------------------- Clinic Level of Care Assessment Details Patient Name: Date of Service: [name] 04/19/2022 7:45 A M Medical Record Number: [id number] Patient Account Number: [id number] Date of Birth/Sex: Treating RN: 11-Sep-1950 (72 y.o. [name], Meta.Reding Primary Care Daiton Cowles: [name], [name] Other Clinician: Referring Tanisia Yokley: Treating Delfino Friesen/Extender: Genice Rouge., [name] in Treatment: 15 Clinic Level of Care  Assessment Items TOOL 4 Quantity Score X- 1 0 Use when only an EandM is performed on FOLLOW-UP visit ASSESSMENTS - Nursing Assessment / Reassessment X- 1 10 Reassessment of Marissa-morbidities (includes updates in patient status) X- 1 5 Reassessment of Adherence to Treatment Plan ASSESSMENTS - Wound and Skin A ssessment / Reassessment X - Simple Wound Assessment / Reassessment - one wound 1 5 []$  - 0 Complex Wound Assessment / Reassessment - multiple wounds []$  - 0 Dermatologic / Skin Assessment (not related to wound area) ASSESSMENTS - Focused Assessment []$  - 0 Circumferential Edema Measurements - multi extremities []$  - 0 Nutritional Assessment / Counseling / Intervention MARYGRACE, SOOS Cardenas ([id number]) [phone].pdf Page 2 of 4 []$  - 0 Lower Extremity Assessment (monofilament, tuning fork, pulses) []$  - 0 Peripheral Arterial Disease Assessment (using hand held doppler) ASSESSMENTS - Ostomy and/or Continence Assessment and Care []$  - 0 Incontinence Assessment and Management []$  - 0 Ostomy Care Assessment and Management (repouching, etc.) PROCESS - Coordination of Care X - Simple Patient / Family Education for ongoing care 1 15 []$  - 0 Complex (extensive) Patient / Family Education for ongoing care X- 1 10 Staff obtains Programmer, systems, Records, T Results / Process Orders est []$  - 0 Staff telephones HHA, Nursing Homes / Clarify orders / etc []$  - 0 Routine Transfer to another Facility (non-emergent condition) []$  - 0 Routine Hospital Admission (non-emergent condition) []$  - 0 New Admissions / Biomedical engineer / Ordering NPWT Apligraf, etc. , []$  - 0 Emergency Hospital Admission (emergent condition) X- 1 10 Simple Discharge Coordination []$  - 0 Complex (extensive) Discharge Coordination PROCESS - Special Needs []$  - 0 Pediatric / Minor Patient Management []$  - 0 Isolation Patient Management []$  - 0 Hearing / Language / Visual special needs []$  -  0 Assessment of Community assistance (transportation, D/C planning, etc.) []$  - 0 Additional assistance / Altered mentation []$  - 0 Support Surface(s) Assessment (bed, cushion, seat, etc.) INTERVENTIONS - Wound  Cleansing / Measurement X - Simple Wound Cleansing - one wound 1 5 []$  - 0 Complex Wound Cleansing - multiple wounds X- 1 5 Wound Imaging (photographs - any number of wounds) []$  - 0 Wound Tracing (instead of photographs) X- 1 5 Simple Wound Measurement - one wound []$  - 0 Complex Wound Measurement - multiple wounds INTERVENTIONS - Wound Dressings X - Small Wound Dressing one or multiple wounds 1 10 []$  - 0 Medium Wound Dressing one or multiple wounds []$  - 0 Large Wound Dressing one or multiple wounds []$  - 0 Application of Medications - topical []$  - 0 Application of Medications - injection INTERVENTIONS - Miscellaneous []$  - 0 External ear exam []$  - 0 Specimen Collection (cultures, biopsies, blood, body fluids, etc.) []$  - 0 Specimen(s) / Culture(s) sent or taken to Lab for analysis []$  - 0 Patient Transfer (multiple staff / Civil Service fast streamer / Similar devices) []$  - 0 Simple Staple / Suture removal (25 or less) []$  - 0 Complex Staple / Suture removal (26 or more) []$  - 0 Hypo / Hyperglycemic Management (close monitor of Blood Glucose) [name], [name] ([id number]) [phone] Page 3 of 4 []$  - 0 Ankle / Brachial Index (ABI) - do not check if billed separately X- 1 5 Vital Signs Has the patient been seen at the hospital within the last three years: Yes Total Score: 85 Level Of Care: New/Established - Level 3 Electronic Signature(s) Signed: 04/19/2022 5:22:46 PM By: Deon Pilling RN, BSN Entered By: Deon Pilling on 04/19/2022 09:00:22 -------------------------------------------------------------------------------- Encounter Discharge Information Details Patient Name: Date of Service: [name], Marissa Pew Cardenas. 04/19/2022 7:45 A M Medical Record Number:  [id number] Patient Account Number: [id number] Date of Birth/Sex: Treating RN: 1950/08/15 (72 y.o. [name], Meta.Reding Primary Care Ociel Retherford: [name], [name] Other Clinician: Referring Sherman Lipuma: Treating Gurpreet Mariani/Extender: Genice Rouge., [name] in Treatment: 15 Encounter Discharge Information Items Discharge Condition: Stable Ambulatory Status: Ambulatory Discharge Destination: Home Transportation: Private Auto Accompanied By: self Schedule Follow-up Appointment: Yes Clinical Summary of Care: Electronic Signature(s) Signed: 04/19/2022 5:22:46 PM By: Deon Pilling RN, BSN Entered By: Deon Pilling on 04/19/2022 09:00:00 -------------------------------------------------------------------------------- Wound Assessment Details Patient Name: Date of Service: Marissa [name]. 04/19/2022 7:45 A M Medical Record Number: [id number] Patient Account Number: [id number] Date of Birth/Sex: Treating RN: 06-11-1950 (72 y.o. [name], Meta.Reding Primary Care Casmir Auguste: [name], [name] Other Clinician: Referring Semisi Biela: Treating Dayonna Selbe/Extender: Genice Rouge., [name] in Treatment: 15 Wound Status Wound Number: 1 Primary Etiology: Pressure Ulcer Wound Location: Sacrum Wound Status: Open Wounding Event: Pressure Injury Date Acquired: 10/09/2021 Weeks Of Treatment: 15 Clustered Wound: No Wound Measurements Length: (cm) 0.4 Width: (cm) 0.4 Depth: (cm) 1.2 Area: (cm) 0.126 Volume: (cm) 0.151 % Reduction in Area: 64.3% % Reduction in Volume: 38.9% Wound Description ELEKTRA, MAUTHE Cardenas ([id number]) Classification: Category/Stage IV Exudate Amount: Medium Exudate Type: Purulent Exudate Color: yellow, brown, green [id number] Page 4 of 4 Periwound Skin Texture Texture Color No Abnormalities Noted: No No Abnormalities Noted: No Moisture No Abnormalities Noted: No Treatment Notes Wound #1 (Sacrum) Cleanser Wound  Cleanser Discharge Instruction: Cleanse the wound with wound cleanser prior to applying a clean dressing using gauze sponges, not tissue or cotton balls. Peri-Wound Care Skin Prep Discharge Instruction: Use skin prep as directed Topical Gentamicin Discharge Instruction: As directed by physician Primary Dressing Iodoform packing strip 1/4 (in) Discharge Instruction: Lightly pack as instructed. Please pack lightly into wound and all around the undermining as it is 4cm  all around. This needs to be done 3 x a week. Secondary Dressing Zetuvit Plus Silicone Border Dressing 4x4 (in/in) Discharge Instruction: Apply silicone border over primary dressing as directed. Secured With Compression Wrap Compression Stockings Environmental education officer) Signed: 04/19/2022 5:22:46 PM By: Deon Pilling RN, BSN Entered By: Deon Pilling on 04/19/2022 08:59:41

## 2022-04-22 ENCOUNTER — Encounter (HOSPITAL_BASED_OUTPATIENT_CLINIC_OR_DEPARTMENT_OTHER): Payer: 59 | Admitting: Internal Medicine

## 2022-04-22 DIAGNOSIS — L89154 Pressure ulcer of sacral region, stage 4: Secondary | ICD-10-CM | POA: Diagnosis not present

## 2022-04-22 DIAGNOSIS — E43 Unspecified severe protein-calorie malnutrition: Secondary | ICD-10-CM | POA: Diagnosis not present

## 2022-04-22 DIAGNOSIS — M6281 Muscle weakness (generalized): Secondary | ICD-10-CM | POA: Diagnosis not present

## 2022-04-22 NOTE — Progress Notes (Signed)
SHALECIA, HOOP R ([id number]) [id number] Page 1 of 1 Visit Report for 04/22/2022 SuperBill Details Patient Name: Date of Service: [name] 04/22/2022 Medical Record Number: [id number] Patient Account Number: [id number] Date of Birth/Sex: Treating RN: 09-09-1950 (72 y.o. [name] Primary Care Provider: [name], [name] Other Clinician: Referring Provider: Treating Provider/Extender: Genice Rouge., [name] in Treatment: 15 Diagnosis Coding ICD-10 Codes Code Description [id number] Pressure ulcer of sacral region, stage 4 M62.81 Muscle weakness (generalized) E43 Unspecified severe protein-calorie malnutrition Facility Procedures CPT4 Code Description Modifier Quantity [id number] [phone] - WOUND CARE VISIT-LEV 2 EST PT 25 1 Electronic Signature(s) Signed: 04/22/2022 1:15:42 PM By: Kalman Shan DO Signed: 04/22/2022 4:13:25 PM By: Sharyn Creamer RN, BSN Entered By: Sharyn Creamer on 04/22/2022 08:31:11

## 2022-04-22 NOTE — Progress Notes (Signed)
[name], [name] ([id number]) [id number] Page 1 of 5 Visit Report for 04/22/2022 Arrival Information Details Patient Name: Date of Service: [name]. 04/22/2022 8:00 [name] Medical Record Number: [id number] Patient Account Number: [id number] Date of Birth/Sex: Treating RN: 11-08-1950 (72 y.o. [name] Primary Care [name]: [name], [name] Other Clinician: Referring Myreon Wimer: Treating [name]: Marissa Rouge., [name] in Treatment: 15 Visit Information History Since Last Visit Added or deleted any medications: No Patient Arrived: Ambulatory Any new allergies or adverse reactions: No Arrival Time: 08:27 Had Cardenas fall or experienced change in No Accompanied By: caregiver activities of daily living that may affect Transfer Assistance: None risk of falls: Patient Identification Verified: Yes Signs or symptoms of abuse/neglect since last visito No Secondary Verification Process Completed: Yes Hospitalized since last visit: No Patient Requires Transmission-Based Precautions: No Implantable device outside of the clinic excluding No Patient Has Alerts: No cellular tissue based products placed in the center since last visit: Has Dressing in Place as Prescribed: Yes Pain Present Now: No Electronic Signature(s) Signed: 04/22/2022 4:13:25 PM By: Sharyn Creamer RN, BSN Entered By: Sharyn Creamer on 04/22/2022 08:27:53 -------------------------------------------------------------------------------- Clinic Level of Care Assessment Details Patient Name: Date of Service: [name] 04/22/2022 8:00 [name] Medical Record Number: [id number] Patient Account Number: [id number] Date of Birth/Sex: Treating RN: Nov 01, 1950 (72 y.o. [name] Primary Care [name]: [name], [name] Other Clinician: Referring [name]: Treating Teresa Lemmerman/Extender: Marissa Rouge., [name] in Treatment: 15 Clinic Level  of Care Assessment Items TOOL 4 Quantity Score X- 1 0 Use when only an EandM is performed on FOLLOW-UP visit ASSESSMENTS - Nursing Assessment / Reassessment X- 1 10 Reassessment of Co-morbidities (includes updates in patient status) X- 1 5 Reassessment of Adherence to Treatment Plan ASSESSMENTS - Wound and Skin Cardenas ssessment / Reassessment X - Simple Wound Assessment / Reassessment - one wound 1 5 []$  - 0 Complex Wound Assessment / Reassessment - multiple wounds []$  - 0 Dermatologic / Skin Assessment (not related to wound area) ASSESSMENTS - Focused Assessment []$  - 0 Circumferential Edema Measurements - multi extremities []$  - 0 Nutritional Assessment / Counseling / Intervention PATSYE, LETTMAN Cardenas ([id number]) [phone].pdf Page 2 of 5 []$  - 0 Lower Extremity Assessment (monofilament, tuning fork, pulses) []$  - 0 Peripheral Arterial Disease Assessment (using hand held doppler) ASSESSMENTS - Ostomy and/or Continence Assessment and Care []$  - 0 Incontinence Assessment and Management []$  - 0 Ostomy Care Assessment and Management (repouching, etc.) PROCESS - Coordination of Care X - Simple Patient / Family Education for ongoing care 1 15 []$  - 0 Complex (extensive) Patient / Family Education for ongoing care X- 1 10 Staff obtains Programmer, systems, Records, T Results / Process Orders est []$  - 0 Staff telephones HHA, Nursing Homes / Clarify orders / etc []$  - 0 Routine Transfer to another Facility (non-emergent condition) []$  - 0 Routine Hospital Admission (non-emergent condition) []$  - 0 New Admissions / Biomedical engineer / Ordering NPWT Apligraf, etc. , []$  - 0 Emergency Hospital Admission (emergent condition) []$  - 0 Simple Discharge Coordination []$  - 0 Complex (extensive) Discharge Coordination PROCESS - Special Needs []$  - 0 Pediatric / Minor Patient Management []$  - 0 Isolation Patient Management []$  - 0 Hearing / Language / Visual special needs []$  -  0 Assessment of Community assistance (transportation, D/C planning, etc.) []$  - 0 Additional assistance / Altered mentation []$  - 0 Support Surface(s) Assessment (bed, cushion, seat, etc.) INTERVENTIONS - Wound  Cleansing / Measurement X - Simple Wound Cleansing - one wound 1 5 []$  - 0 Complex Wound Cleansing - multiple wounds []$  - 0 Wound Imaging (photographs - any number of wounds) []$  - 0 Wound Tracing (instead of photographs) []$  - 0 Simple Wound Measurement - one wound []$  - 0 Complex Wound Measurement - multiple wounds INTERVENTIONS - Wound Dressings X - Small Wound Dressing one or multiple wounds 1 10 []$  - 0 Medium Wound Dressing one or multiple wounds []$  - 0 Large Wound Dressing one or multiple wounds []$  - 0 Application of Medications - topical []$  - 0 Application of Medications - injection INTERVENTIONS - Miscellaneous []$  - 0 External ear exam []$  - 0 Specimen Collection (cultures, biopsies, blood, body fluids, etc.) []$  - 0 Specimen(s) / Culture(s) sent or taken to Lab for analysis []$  - 0 Patient Transfer (multiple staff / Civil Service fast streamer / Similar devices) []$  - 0 Simple Staple / Suture removal (25 or less) []$  - 0 Complex Staple / Suture removal (26 or more) []$  - 0 Hypo / Hyperglycemic Management (close monitor of Blood Glucose) [name], [name] ([id number]) [phone].pdf Page 3 of 5 []$  - 0 Ankle / Brachial Index (ABI) - do not check if billed separately []$  - 0 Vital Signs Has the patient been seen at the hospital within the last three years: Yes Total Score: 60 Level Of Care: New/Established - Level 2 Electronic Signature(s) Signed: 04/22/2022 4:13:25 PM By: Sharyn Creamer RN, BSN Entered By: Sharyn Creamer on 04/22/2022 08:30:52 -------------------------------------------------------------------------------- Encounter Discharge Information Details Patient Name: Date of Service: [name]. 04/22/2022 8:00 [name] Medical Record Number:  [id number] Patient Account Number: [id number] Date of Birth/Sex: Treating RN: 01/31/1951 (72 y.o. [name] Primary Care Asyah Candler: [name], [name] Other Clinician: Referring Anahita Cua: Treating Glennon Kopko/Extender: Marissa Rouge., [name] in Treatment: 15 Encounter Discharge Information Items Discharge Condition: Stable Ambulatory Status: Ambulatory Discharge Destination: Home Transportation: Private Auto Accompanied By: caregiver Schedule Follow-up Appointment: Yes Clinical Summary of Care: Patient Declined Electronic Signature(s) Signed: 04/22/2022 4:13:25 PM By: Sharyn Creamer RN, BSN Entered By: Sharyn Creamer on 04/22/2022 08:29:17 -------------------------------------------------------------------------------- Patient/Caregiver Education Details Patient Name: Date of Service: [name] 2/12/2024andnbsp8:00 [place] Record Number: [id number] Patient Account Number: [id number] Date of Birth/Gender: Treating RN: 03-19-1950 (72 y.o. [name] Primary Care Physician: [name], [name] Other Clinician: Referring Physician: Treating Physician/Extender: Marissa Rouge., [name] in Treatment: 15 Education Assessment Education Provided To: Patient Education Topics Provided Wound/Skin Impairment: Methods: Explain/Verbal Responses: State content correctly [employer]) Signed: 04/22/2022 4:13:25 PM By: Sharyn Creamer RN, BSN Entered By: Sharyn Creamer on 04/22/2022 08:29:02 [name], [name] ([id number] Page 4 of 5 -------------------------------------------------------------------------------- Wound Assessment Details Patient Name: Date of Service: [name]. 04/22/2022 8:00 [name] Medical Record Number: [id number] Patient Account Number: [id number] Date of Birth/Sex: Treating RN: 1950/10/15 (72 y.o. [name] Primary Care Grenda Lora: [name], [name]  Other Clinician: Referring Braniya Farrugia: Treating Olesya Wike/Extender: Marissa Rouge., [name] in Treatment: 15 Wound Status Wound Number: 1 Primary Etiology: Pressure Ulcer Wound Location: Sacrum Wound Status: Open Wounding Event: Pressure Injury Date Acquired: 10/09/2021 Weeks Of Treatment: 15 Clustered Wound: No Wound Measurements Length: (cm) 0.4 Width: (cm) 0.4 Depth: (cm) 1.2 Area: (cm) 0.126 Volume: (cm) 0.151 % Reduction in Area: 64.3% % Reduction in Volume: 38.9% Wound Description Classification: Category/Stage IV Exudate Amount: Medium Exudate Type: Purulent Exudate Color: yellow, brown, green  Periwound Skin Texture Texture Color No Abnormalities Noted: No No Abnormalities Noted: No Moisture No Abnormalities Noted: No Treatment Notes Wound #1 (Sacrum) Cleanser Wound Cleanser Discharge Instruction: Cleanse the wound with wound cleanser prior to applying Cardenas clean dressing using gauze sponges, not tissue or cotton balls. Peri-Wound Care Skin Prep Discharge Instruction: Use skin prep as directed Topical Gentamicin Discharge Instruction: As directed by physician Primary Dressing Iodoform packing strip 1/4 (in) Discharge Instruction: Lightly pack as instructed. Please pack lightly into wound and all around the undermining as it is 4cm all around. This needs to be done 3 x Cardenas week. Secondary Dressing Zetuvit Plus Silicone Border Dressing 4x4 (in/in) Discharge Instruction: Apply silicone border over primary dressing as directed. Secured With Compression TYSHA, [name] ([id number]) [id number] Page 5 of 5 Compression Stockings Add-Ons Electronic Signature(s) Signed: 04/22/2022 4:13:25 PM By: Sharyn Creamer RN, BSN Entered By: Sharyn Creamer on 04/22/2022 08:28:19 -------------------------------------------------------------------------------- [place] Details Patient Name: Date of Service: [name], Marissa Pew Cardenas.  04/22/2022 8:00 [name] Medical Record Number: [id number] Patient Account Number: [id number] Date of Birth/Sex: Treating RN: 01-06-1951 (72 y.o. [name] Primary Care Aaliyana Fredericks: [name], [name] Other Clinician: Referring Jamonica Schoff: Treating Zeyna Mkrtchyan/Extender: Marissa Rouge., [name] in Treatment: 15 Vital Signs Time Taken: 08:02 Reference Range: 80 - 120 mg / dl Height (in): 64 Weight (lbs): 97 Body Mass Index (BMI): 16.6 Electronic Signature(s) Signed: 04/22/2022 4:13:25 PM By: Sharyn Creamer RN, BSN Entered By: Sharyn Creamer on 04/22/2022 [id number]

## 2022-04-24 ENCOUNTER — Encounter (HOSPITAL_BASED_OUTPATIENT_CLINIC_OR_DEPARTMENT_OTHER): Payer: 59 | Admitting: Physician Assistant

## 2022-04-24 DIAGNOSIS — Z79899 Other long term (current) drug therapy: Secondary | ICD-10-CM | POA: Diagnosis not present

## 2022-04-24 DIAGNOSIS — L89154 Pressure ulcer of sacral region, stage 4: Secondary | ICD-10-CM | POA: Diagnosis not present

## 2022-04-24 DIAGNOSIS — M6281 Muscle weakness (generalized): Secondary | ICD-10-CM | POA: Diagnosis not present

## 2022-04-24 DIAGNOSIS — E43 Unspecified severe protein-calorie malnutrition: Secondary | ICD-10-CM | POA: Diagnosis not present

## 2022-04-25 NOTE — Progress Notes (Addendum)
Marissa, KUBIN Cardenas ([id number]) [id number] Page 1 of 7 Visit Report for 04/24/2022 Chief Complaint Document Details Patient Name: Date of Service: [name]. 04/24/2022 3:00 PM Medical Record Number: [id number] Patient Account Number: [id number] Date of Birth/Sex: Treating RN: Feb 23, 1951 (72 y.o. F) Primary Care Provider: [name], [name] Other Clinician: Referring Provider: Treating Provider/Extender: [name] in Treatment: 16 Information Obtained from: Patient Chief Complaint Pressure ulcer stage 3 Sacrum Electronic Signature(s) Signed: 04/24/2022 2:55:47 PM By: Worthy Keeler PA-C Entered By: Worthy Keeler on 04/24/2022 14:55:47 -------------------------------------------------------------------------------- HPI Details Patient Name: Date of Service: [name]. 04/24/2022 3:00 PM Medical Record Number: [id number] Patient Account Number: [id number] Date of Birth/Sex: Treating RN: 07-May-1950 (72 y.o. F) Primary Care Provider: [name], [name] Other Clinician: Referring Provider: Treating Provider/Extender: [name] in Treatment: 16 History of Present Illness HPI Description: 01-02-22 upon evaluation today patient presents for initial inspection here in our clinic concerning a wound that she has over the sacral region. This is stated to be present since around the beginning of August 2023. She currently resides in a assisted nursing facility. She does seem to be able to answer questions fully today. Subsequently I do note that she is a little on the thin side but again other than the protein calorie malnutrition she is minimally weak but still does get up and move around some but she also tells me that she "sits a lot". She has not had any x-rays of the sacral region at this point. 01-09-2022 upon evaluation today patient's wound in the sacral area actually appears to be  doing decently well. Fortunately I do not see any signs of infection which is great news and overall I am extremely pleased with where things stand today. 11/8; this is a patient who lives in some form of small assisted living in [place]. She has a deep wound over the lower part of her coccyx. An x-ray that we ordered from last time did not show any osseous abnormalities. We are supposed to be using Hydrofera Blue in the wound but I am really not sure what they are using to dress this and who is doing it. Talking to our staff they have apparently discussed this with the staff in the facility. She does not have home health. 01-23-2022 upon evaluation today patient appears to be doing decently well in regard to her wound. She has been tolerating the dressing changes without complication although I am not certain the dressing changes have been done appropriately over the past couple of weeks. We have been trying to get her to come in here we also try to get her into a wound care center in [place] which would be closer for the assisted living facility. Unfortunately neither 1 of those were undertaken up to this point. The patient did end up having some training of the staff from an RN that they brought him to have the staff perform the dressing changes but that being said it does not sound like this has been done correctly. Nonetheless I do believe that based on what we see it would benefit the patient to actually have her come here 3 times a week also think a wound VAC could potentially be a possibility for her which would help to get things moving in a much better direction as far as healing is concerned. We need to get this to fill-in though it looks clean and  does not look infected I do think that we need to really get this to fill-in and there is quite a bit of undermining that is to be considered here. 01-30-2022 upon evaluation today patient appears to be doing well currently in regard to her wound  which is looking pretty decent but still has quite a bit of space underneath as far as undermining is concerned. I think she might possibly be better with a wound VAC. With that being said if when I do this we probably only be able to do it 2 times a week at most. I discussed that with the patient today she is okay with that we just need to see if we can get the insurance approval and then of course the scheduling underway. 02-13-2022 upon evaluation today patient appears to be doing well currently in regard to her wound. She actually tells me that it is feeling a lot better which is good news. Fortunately I do not see any signs of active infection at this time. No fevers, chills, nausea, vomiting, or diarrhea. 02-27-2022 upon evaluation today patient appears to be doing well currently in regard to her wound. She is actually showing some signs of improvement we are AALEIYA, [name] ([id number]) [id number] Page 2 of 7 still obtaining the approval for the snap VAC which I think could be beneficial in the meantime we been using the Hydrofera Blue rope. 03-27-2022 upon evaluation today patient appears to be doing okay in regard to her wound but there was no dressing in place upon arrival today. Fortunately I see again no evidence of infection. No fevers, chills, nausea, vomiting, or diarrhea. 04-03-2022 upon evaluation today patient did not have a dressing on when she came into the office. With that being said she actually tells me that it was on until yesterday she took a shower and it came loose and then today came off completely. With that being said I really do not think she needs to be taking shower every single day or having to see her here in the clinic 3 times per week on Monday, Wednesday, and Friday and during those times obviously in between she needs to probably avoid showering. For that reason what I advised her to do would be to shower in the mornings on the days  that she comes to see [place] that way if the dressing does come off or start to come off we will be changing it anyway. She voiced understanding and tells me she can do that. Unfortunately the facility has nobody to help her with changing the dressings and we are not currently having to do that here in the clinic again. This means that during the time that she was not coming in from December 20 through when I saw her last week on the 17th there was a period of 2 weeks where the wound really was not changed at all as the RN that Myriam Jacobson tell me they had at the facility did not end up working out. This obviously is not good and is not what we expect to see as far as patient care is concerned which is why we are now seeing her 3 times a week here in the clinic. 04-10-2022 upon evaluation today patient appears to be doing well currently in regard to her wound which is actually showing signs of good granulation epithelization at this point. However she does have quite a bit of drainage and we are little concerned about the possibility of infection. For  that reason I think she could benefit from a PCR culture followed by [facility] topical antibiotics. I am also thinking of going ahead and doing gentamicin today as well. 2/7; small wound on the lower sacrum however with considerable degree of undermining from roughly 7-4. PCR culture that was done last week showed "no organisms". We have been using gentamicin and iodoform packing.She lives in some form of group home in [place] I believe. Uncertain about the adequacy of offloading this area 04-24-2022 upon evaluation patient's wound actually is showing signs of doing decently well I do believe she would benefit from a snap VAC and we discussed that again today. Fortunately I do not see any signs of active infection locally nor systemically which is great news. No fevers, chills, nausea, vomiting, or diarrhea. Electronic Signature(s) Signed: 04/24/2022 5:34:03 PM  By: Worthy Keeler PA-C Entered By: Worthy Keeler on 04/24/2022 17:34:02 -------------------------------------------------------------------------------- Physical Exam Details Patient Name: Date of Service: [name]. 04/24/2022 3:00 PM Medical Record Number: [id number] Patient Account Number: [id number] Date of Birth/Sex: Treating RN: October 31, 1950 (72 y.o. F) Primary Care Provider: [name], [name] Other Clinician: Referring Provider: Treating Provider/Extender: [name] in Treatment: 21 Constitutional Well-nourished and well-hydrated in no acute distress. Respiratory normal breathing without difficulty. Psychiatric this patient is able to make decisions and demonstrates good insight into disease process. Alert and Oriented x 3. pleasant and cooperative. Notes Upon inspection patient's wound bed actually showed signs of again still some undermining at this point. I do not believe there is any evidence of active infection which is great news I think she would benefit from a snap VAC in order to go ahead and see about getting this done as quickly as possible. Electronic Signature(s) Signed: 04/24/2022 5:35:12 PM By: Worthy Keeler PA-C Entered By: Worthy Keeler on 04/24/2022 17:35:12 -------------------------------------------------------------------------------- Physician Orders Details Patient Name: Date of Service: Marissa CKRELL, Nance Pew Cardenas. 04/24/2022 3:00 PM Medical Record Number: [id number] Patient Account Number: [id number] Date of Birth/Sex: Treating RN: October 24, 1950 (72 y.o. Benjaman Lobe Primary Care Provider: [name], [place] Clinician: CIELO, SCHNEITER ([id number]) [id number] Page 3 of 7 Referring Provider: Treating Provider/Extender: [name] in Treatment: [phone] Verbal / Phone Orders: No Diagnosis Coding ICD-10 Coding Code Description L89.154 Pressure ulcer  of sacral region, stage 4 M62.81 Muscle weakness (generalized) E43 Unspecified severe protein-calorie malnutrition Follow-up Appointments ppointment in 1 week. - w/ Jeri Cos, PA and Lauran Rm # 9 Wednesday 05/01/22 @ 2:00 Return A Nurse Visit: - Monday 04/29/22 @ 7:45 and Friday 05/03/22 @ 7:45 Other: - Administrator Arbutus Ped) : [phone] PCR culture negative-no [employer] ordered. Anesthetic (In clinic) Topical Lidocaine 5% applied to wound bed Negative Presssure Wound Therapy Other: - Run insurance for snap vac Off-Loading Turn and reposition every 2 hours [place] to [place] for skilled nursing wound care. May utilize formulary equivalent dressing for wound treatment orders unless otherwise specified. - ADmit to [place]. Home health to change Monday's and Friday's, wound care center to change Wednesday's. Wound care center will change Monday's, Wednesday's, and FRiday's until home health can get approved. Wound Treatment Wound #1 - Sacrum Cleanser: Wound Cleanser (Home Health) 3 x Per Week/30 Days Discharge Instructions: Cleanse the wound with wound cleanser prior to applying a clean dressing using gauze sponges, not tissue or cotton balls. Peri-Wound Care: Skin Prep (Home Health) 3 x Per Week/30 Days Discharge Instructions: Use skin  prep as directed Topical: Gentamicin 3 x Per Week/30 Days Discharge Instructions: As directed by physician Prim Dressing: Hydrofera Blue Ready Transfer Foam, 2.5x2.5 (in/in) 3 x Per Week/30 Days ary Discharge Instructions: Apply directly to wound bed as directed Secondary Dressing: Zetuvit Plus Silicone Border Dressing 4x4 (in/in) ([place]) 3 x Per Week/30 Days Discharge Instructions: Apply silicone border over primary dressing as directed. Electronic Signature(s) Signed: 04/24/2022 4:47:24 PM By: [name] Signed: 04/24/2022 5:47:19 PM By: Worthy Keeler PA-C Entered By: [name] on 04/24/2022  15:54:01 -------------------------------------------------------------------------------- Problem List Details Patient Name: Date of Service: [name]. 04/24/2022 3:00 PM Medical Record Number: [id number] Patient Account Number: [id number] Date of Birth/Sex: Treating RN: 06/15/1950 (72 y.o. F) Primary Care Provider: [name], [name] Other Clinician: Referring Provider: Treating Provider/Extender: [name] in Treatment: [address] [place] Cardenas ([id number]) [phone].pdf Page 4 of 7 Active Problems ICD-10 Encounter Code Description Active Date MDM Diagnosis L89.154 Pressure ulcer of sacral region, stage 4 01/02/2022 No Yes M62.81 Muscle weakness (generalized) 01/02/2022 No Yes E43 Unspecified severe protein-calorie malnutrition 01/02/2022 No Yes Inactive Problems Resolved Problems Electronic Signature(s) Signed: 04/24/2022 2:55:40 PM By: Worthy Keeler PA-C Entered By: Worthy Keeler on 04/24/2022 14:55:39 -------------------------------------------------------------------------------- Progress Note Details Patient Name: Date of Service: [name]. 04/24/2022 3:00 PM Medical Record Number: [id number] Patient Account Number: [id number] Date of Birth/Sex: Treating RN: 1951-02-16 (72 y.o. F) Primary Care Provider: [name], [name] Other Clinician: Referring Provider: Treating Provider/Extender: [name] in Treatment: 16 Subjective Chief Complaint Information obtained from Patient Pressure ulcer stage 3 Sacrum History of Present Illness (HPI) 01-02-22 upon evaluation today patient presents for initial inspection here in our clinic concerning a wound that she has over the sacral region. This is stated to be present since around the beginning of August 2023. She currently resides in a assisted nursing facility. She does seem to be able to answer questions fully today.  Subsequently I do note that she is a little on the thin side but again other than the protein calorie malnutrition she is minimally weak but still does get up and move around some but she also tells me that she "sits a lot". She has not had any x-rays of the sacral region at this point. 01-09-2022 upon evaluation today patient's wound in the sacral area actually appears to be doing decently well. Fortunately I do not see any signs of infection which is great news and overall I am extremely pleased with where things stand today. 11/8; this is a patient who lives in some form of small assisted living in [place]. She has a deep wound over the lower part of her coccyx. An x-ray that we ordered from last time did not show any osseous abnormalities. We are supposed to be using Hydrofera Blue in the wound but I am really not sure what they are using to dress this and who is doing it. Talking to our staff they have apparently discussed this with the staff in the facility. She does not have home health. 01-23-2022 upon evaluation today patient appears to be doing decently well in regard to her wound. She has been tolerating the dressing changes without complication although I am not certain the dressing changes have been done appropriately over the past couple of weeks. We have been trying to get her to come in here we also try to get her into a wound care  center in [place] which would be closer for the assisted living facility. Unfortunately neither 1 of those were undertaken up to this point. The patient did end up having some training of the staff from an RN that they brought him to have the staff perform the dressing changes but that being said it does not sound like this has been done correctly. Nonetheless I do believe that based on what we see it would benefit the patient to actually have her come here 3 times a week also think a wound VAC could potentially be a possibility for her which would help to get  things moving in a much better direction as far as healing is concerned. We need to get this to fill-in though it looks clean and does not look infected I do think that we need to really get this to fill-in and there is quite a bit of undermining that is to be considered here. 01-30-2022 upon evaluation today patient appears to be doing well currently in regard to her wound which is looking pretty decent but still has quite a bit of space underneath as far as undermining is concerned. I think she might possibly be better with a wound VAC. With that being said if when I do this we probably only be able to do it 2 times a week at most. I discussed that with the patient today she is okay with that we just need to see if we can get the insurance approval and then of course the scheduling underway. 02-13-2022 upon evaluation today patient appears to be doing well currently in regard to her wound. She actually tells me that it is feeling a lot better which is good news. Fortunately I do not see any signs of active infection at this time. No fevers, chills, nausea, vomiting, or diarrhea. [name], [name] ([id number]) [id number] Page 5 of 7 02-27-2022 upon evaluation today patient appears to be doing well currently in regard to her wound. She is actually showing some signs of improvement we are still obtaining the approval for the snap VAC which I think could be beneficial in the meantime we been using the Hydrofera Blue rope. 03-27-2022 upon evaluation today patient appears to be doing okay in regard to her wound but there was no dressing in place upon arrival today. Fortunately I see again no evidence of infection. No fevers, chills, nausea, vomiting, or diarrhea. 04-03-2022 upon evaluation today patient did not have a dressing on when she came into the office. With that being said she actually tells me that it was on until yesterday she took a shower and it came loose and then  today came off completely. With that being said I really do not think she needs to be taking shower every single day or having to see her here in the clinic 3 times per week on Monday, Wednesday, and Friday and during those times obviously in between she needs to probably avoid showering. For that reason what I advised her to do would be to shower in the mornings on the days that she comes to see [place] that way if the dressing does come off or start to come off we will be changing it anyway. She voiced understanding and tells me she can do that. Unfortunately the facility has nobody to help her with changing the dressings and we are not currently having to do that here in the clinic again. This means that during the time that she was not coming in from  December 20 through when I saw her last week on the 17th there was a period of 2 weeks where the wound really was not changed at all as the RN that Myriam Jacobson tell me they had at the facility did not end up working out. This obviously is not good and is not what we expect to see as far as patient care is concerned which is why we are now seeing her 3 times a week here in the clinic. 04-10-2022 upon evaluation today patient appears to be doing well currently in regard to her wound which is actually showing signs of good granulation epithelization at this point. However she does have quite a bit of drainage and we are little concerned about the possibility of infection. For that reason I think she could benefit from a PCR culture followed by [facility] topical antibiotics. I am also thinking of going ahead and doing gentamicin today as well. 2/7; small wound on the lower sacrum however with considerable degree of undermining from roughly 7-4. PCR culture that was done last week showed "no organisms". We have been using gentamicin and iodoform packing.She lives in some form of group home in [place] I believe. Uncertain about the adequacy of offloading this  area 04-24-2022 upon evaluation patient's wound actually is showing signs of doing decently well I do believe she would benefit from a snap VAC and we discussed that again today. Fortunately I do not see any signs of active infection locally nor systemically which is great news. No fevers, chills, nausea, vomiting, or diarrhea. Objective Constitutional Well-nourished and well-hydrated in no acute distress. Vitals Time Taken: 3:23 PM, Height: 64 in, Weight: 97 lbs, BMI: 16.6, Temperature: 97.8 F, Pulse: 71 bpm, Respiratory Rate: 18 breaths/min, Blood Pressure: 95/58 mmHg. Respiratory normal breathing without difficulty. Psychiatric this patient is able to make decisions and demonstrates good insight into disease process. Alert and Oriented x 3. pleasant and cooperative. General Notes: Upon inspection patient's wound bed actually showed signs of again still some undermining at this point. I do not believe there is any evidence of active infection which is great news I think she would benefit from a snap VAC in order to go ahead and see about getting this done as quickly as possible. Integumentary (Hair, Skin) Wound #1 status is Open. Original cause of wound was Pressure Injury. The date acquired was: 10/09/2021. The wound has been in treatment 16 weeks. The wound is located on the Sacrum. The wound measures 0.6cm length x 0.4cm width x 1cm depth; 0.188cm^2 area and 0.188cm^3 volume. There is no tunneling noted, however, there is undermining starting at 5:00 and ending at 10:00 with a maximum distance of 2cm. There is a medium amount of purulent drainage noted. There is large (67-100%) pink granulation within the wound bed. The periwound skin appearance exhibited: Dry/Scaly. The periwound skin appearance did not exhibit: Callus, Crepitus, Excoriation, Induration, Rash, Scarring, Maceration, Atrophie Blanche, Cyanosis, Ecchymosis, Hemosiderin Staining, Mottled, Pallor, Rubor, Erythema. Periwound  temperature was noted as No Abnormality. Assessment Active Problems ICD-10 Pressure ulcer of sacral region, stage 4 Muscle weakness (generalized) Unspecified severe protein-calorie malnutrition Plan Follow-up Appointments: Return Appointment in 1 week. - w/ Jeri Cos, PA and Lauran Rm # 9 Wednesday 05/01/22 @ 2:00 Nurse Visit: - Monday 04/29/22 @ 7:45 and Friday 05/03/22 @ 7:45 Other: - Administrator [facility] Ped) : [phone] PCR culture negative-no [employer] ordered. AnestheticMEGANNE, [name] ([id number]) [id number] Page 6 of 7 (In clinic) Topical Lidocaine 5% applied to wound bed Negative  Presssure Wound Therapy: Other: - Run insurance for snap vac Off-Loading: Turn and reposition every 2 hours Home Health: Admit to [facility] for skilled nursing wound care. May utilize formulary equivalent dressing for wound treatment orders unless otherwise specified. - ADmit to [place]. Home health to change Monday's and Friday's, wound care center to change Wednesday's. Wound care center will change Monday's, Wednesday's, and FRiday's until home health can get approved. WOUND #1: - Sacrum Wound Laterality: Cleanser: Wound Cleanser (Home Health) 3 x Per Week/30 Days Discharge Instructions: Cleanse the wound with wound cleanser prior to applying a clean dressing using gauze sponges, not tissue or cotton balls. Peri-Wound Care: Skin Prep (Home Health) 3 x Per Week/30 Days Discharge Instructions: Use skin prep as directed Topical: Gentamicin 3 x Per Week/30 Days Discharge Instructions: As directed by physician Prim Dressing: Hydrofera Blue Ready Transfer Foam, 2.5x2.5 (in/in) 3 x Per Week/30 Days ary Discharge Instructions: Apply directly to wound bed as directed Secondary Dressing: Zetuvit Plus Silicone Border Dressing 4x4 (in/in) ([place]) 3 x Per Week/30 Days Discharge Instructions: Apply silicone border over primary dressing as directed. 1. I recommend  currently that we have the patient continue to monitor for any signs of infection or worsening. Based on what I am seeing I do believe that we are making some good progress and I am pleased in that regard. With that being said I do think that we need to continue to do the best thing to try to get this to fill and I think snapback would be the best way to go and I discussed that with the patient today. Will get a try to see about getting this ordered. Will get a need a kit that actually has a track pad so that she is not laying directly on the attachment and I think that once we get that we will be ready to go with this hopefully will have that by next week. 2. I would recommend continued and appropriate offloading she is aware what to do when she does a really good job as far as trying to stay off of this as well. We will see patient back for reevaluation in 1 week here in the clinic. If anything worsens or changes patient will contact our office for additional recommendations. Electronic Signature(s) Signed: 04/24/2022 5:36:36 PM By: Worthy Keeler PA-C Entered By: Worthy Keeler on 04/24/2022 17:36:36 -------------------------------------------------------------------------------- SuperBill Details Patient Name: Date of Service: [name]. 04/24/2022 Medical Record Number: [id number] Patient Account Number: [id number] Date of Birth/Sex: Treating RN: 06-29-1950 (72 y.o. [name], [name] Primary Care Provider: [name], [name] Other Clinician: Referring Provider: Treating Provider/Extender: [name] in Treatment: 16 Diagnosis Coding ICD-10 Codes Code Description [phone] Pressure ulcer of sacral region, stage 4 M62.81 Muscle weakness (generalized) E43 Unspecified severe protein-calorie malnutrition Facility Procedures : CPT4 Code: [id number] Description: [id number] - WOUND CARE VISIT-LEV 3 EST PT Modifier: Quantity: 1 Physician  Procedures Electronic Signature(s) Signed: 04/24/2022 5:38:26 PM By: Worthy Keeler PA-C Previous Signature: 04/24/2022 4:47:24 PM Version By: [name] Entered By: Worthy Keeler on 04/24/2022 17:38:26

## 2022-04-26 ENCOUNTER — Encounter (HOSPITAL_BASED_OUTPATIENT_CLINIC_OR_DEPARTMENT_OTHER): Payer: 59 | Admitting: Internal Medicine

## 2022-04-26 DIAGNOSIS — E43 Unspecified severe protein-calorie malnutrition: Secondary | ICD-10-CM | POA: Diagnosis not present

## 2022-04-26 DIAGNOSIS — M6281 Muscle weakness (generalized): Secondary | ICD-10-CM | POA: Diagnosis not present

## 2022-04-26 DIAGNOSIS — L89154 Pressure ulcer of sacral region, stage 4: Secondary | ICD-10-CM | POA: Diagnosis not present

## 2022-04-27 NOTE — Progress Notes (Signed)
AIRYANA, HARGUS R ([id number]) [id number] Page 1 of 7 Visit Report for 04/24/2022 Arrival Information Details Patient Name: Date of Service: [name]. 04/24/2022 3:00 PM Medical Record Number: [id number] Patient Account Number: [id number] Date of Birth/Sex: Treating RN: [birth year] (72 y.o. F) Primary Care Ivet Guerrieri: [name], [name] Other Clinician: Referring Elidia Bonenfant: Treating Cayli Escajeda/Extender: [name] in Treatment: 16 Visit Information History Since Last Visit Added or deleted any medications: No Patient Arrived: Ambulatory Any new allergies or adverse reactions: No Arrival Time: 15:23 Had a fall or experienced change in No Accompanied By: self activities of daily living that may affect Transfer Assistance: None risk of falls: Patient Identification Verified: Yes Signs or symptoms of abuse/neglect since last visito No Secondary Verification Process Completed: Yes Hospitalized since last visit: No Patient Requires Transmission-Based Precautions: No Implantable device outside of the clinic excluding No Patient Has Alerts: No cellular tissue based products placed in the center since last visit: Has Dressing in Place as Prescribed: Yes Pain Present Now: No Electronic Signature(s) Signed: 04/26/2022 12:03:25 PM By: Erenest Blank Entered By: Erenest Blank on 04/24/2022 15:23:37 -------------------------------------------------------------------------------- Clinic Level of Care Assessment Details Patient Name: Date of Service: [name] 04/24/2022 3:00 PM Medical Record Number: [id number] Patient Account Number: [id number] Date of Birth/Sex: Treating RN: [birth year] (72 y.o. [name], [name] Primary Care Dorothe Elmore: [name], [name] Other Clinician: Referring Nolia Tschantz: Treating Sama Arauz/Extender: [name] in Treatment: 16 Clinic Level of Care Assessment  Items TOOL 4 Quantity Score X- 1 0 Use when only an EandM is performed on FOLLOW-UP visit ASSESSMENTS - Nursing Assessment / Reassessment X- 1 10 Reassessment of Co-morbidities (includes updates in patient status) X- 1 5 Reassessment of Adherence to Treatment Plan ASSESSMENTS - Wound and Skin A ssessment / Reassessment X - Simple Wound Assessment / Reassessment - one wound 1 5 []$  - 0 Complex Wound Assessment / Reassessment - multiple wounds []$  - 0 Dermatologic / Skin Assessment (not related to wound area) ASSESSMENTS - Focused Assessment []$  - 0 Circumferential Edema Measurements - multi extremities []$  - 0 Nutritional Assessment / Counseling / Intervention CORRIN, HAYHURST R ([id number]) [phone] Page 2 of 7 []$  - 0 Lower Extremity Assessment (monofilament, tuning fork, pulses) []$  - 0 Peripheral Arterial Disease Assessment (using hand held doppler) ASSESSMENTS - Ostomy and/or Continence Assessment and Care []$  - 0 Incontinence Assessment and Management []$  - 0 Ostomy Care Assessment and Management (repouching, etc.) PROCESS - Coordination of Care X - Simple Patient / Family Education for ongoing care 1 15 []$  - 0 Complex (extensive) Patient / Family Education for ongoing care X- 1 10 Staff obtains Programmer, systems, Records, T Results / Process Orders est []$  - 0 Staff telephones HHA, Nursing Homes / Clarify orders / etc []$  - 0 Routine Transfer to another Facility (non-emergent condition) []$  - 0 Routine Hospital Admission (non-emergent condition) []$  - 0 New Admissions / Biomedical engineer / Ordering NPWT Apligraf, etc. , []$  - 0 Emergency Hospital Admission (emergent condition) X- 1 10 Simple Discharge Coordination []$  - 0 Complex (extensive) Discharge Coordination PROCESS - Special Needs []$  - 0 Pediatric / Minor Patient Management []$  - 0 Isolation Patient Management []$  - 0 Hearing / Language / Visual special needs []$  - 0 Assessment of  Community assistance (transportation, D/C planning, etc.) []$  - 0 Additional assistance / Altered mentation []$  - 0 Support Surface(s) Assessment (bed, cushion, seat, etc.) INTERVENTIONS - Wound Cleansing / Measurement X -  Simple Wound Cleansing - one wound 1 5 []$  - 0 Complex Wound Cleansing - multiple wounds X- 1 5 Wound Imaging (photographs - any number of wounds) []$  - 0 Wound Tracing (instead of photographs) X- 1 5 Simple Wound Measurement - one wound []$  - 0 Complex Wound Measurement - multiple wounds INTERVENTIONS - Wound Dressings X - Small Wound Dressing one or multiple wounds 1 10 []$  - 0 Medium Wound Dressing one or multiple wounds []$  - 0 Large Wound Dressing one or multiple wounds X- 1 5 Application of Medications - topical []$  - 0 Application of Medications - injection INTERVENTIONS - Miscellaneous []$  - 0 External ear exam []$  - 0 Specimen Collection (cultures, biopsies, blood, body fluids, etc.) []$  - 0 Specimen(s) / Culture(s) sent or taken to Lab for analysis []$  - 0 Patient Transfer (multiple staff / Civil Service fast streamer / Similar devices) []$  - 0 Simple Staple / Suture removal (25 or less) []$  - 0 Complex Staple / Suture removal (26 or more) []$  - 0 Hypo / Hyperglycemic Management (close monitor of Blood Glucose) LAYANNA, WELLENDORF R ([id number].pdf Page 3 of 7 []$  - 0 Ankle / Brachial Index (ABI) - do not check if billed separately X- 1 5 Vital Signs Has the patient been seen at the hospital within the last three years: Yes Total Score: 90 Level Of Care: New/Established - Level 3 Electronic Signature(s) Signed: 04/24/2022 4:47:24 PM By: [name] Entered By: [name] on 04/24/2022 16:17:16 -------------------------------------------------------------------------------- Encounter Discharge Information Details Patient Name: Date of Service: [name] 04/24/2022 3:00 PM Medical Record Number:  [id number] Patient Account Number: [id number] Date of Birth/Sex: Treating RN: 10-19-1950 (72 y.o. [name], [name] Primary Care Stevie Charter: [name], [name] Other Clinician: Referring Arham Symmonds: Treating Vong Garringer/Extender: [name] in Treatment: 16 Encounter Discharge Information Items Discharge Condition: Stable Ambulatory Status: Ambulatory Discharge Destination: Home Transportation: Private Auto Accompanied By: self Schedule Follow-up Appointment: Yes Clinical Summary of Care: Patient Declined Electronic Signature(s) Signed: 04/24/2022 4:47:24 PM By: [name] Entered By: [name] on 04/24/2022 16:18:03 -------------------------------------------------------------------------------- Lower Extremity Assessment Details Patient Name: Date of Service: [name] 04/24/2022 3:00 PM Medical Record Number: [id number] Patient Account Number: [id number] Date of Birth/Sex: Treating RN: Jun 20, 1950 (72 y.o. F) Primary Care Keyandra Swenson: [name], [name] Other Clinician: Referring Kristapher [place]: Treating Zakara Parkey/Extender: [name] in Treatment: 16 Electronic Signature(s) Signed: 04/26/2022 12:03:25 PM By: Erenest Blank Entered By: Erenest Blank on 04/24/2022 15:24:10 -------------------------------------------------------------------------------- Multi-Disciplinary Care Plan Details Patient Name: Date of Service: Larae Grooms, [name] 04/24/2022 3:00 PM Medical Record Number: [id number] Patient Account Number: [id number] VARA, OSTERKAMP ([id number]) [phone].pdf Page 4 of 7 Date of Birth/Sex: Treating RN: [birth year] (72 y.o. [name], [name] Primary Care Maury Groninger: Other Clinician: [name], [name] Referring Khyli Swaim: Treating Jeanne Terrance/Extender: [name] in Treatment: 16 Active Inactive Pain, Acute or Chronic Nursing Diagnoses: Pain,  acute or chronic: actual or potential Potential alteration in comfort, pain Goals: Patient will verbalize adequate pain control and receive pain control interventions during procedures as needed Date Initiated: 01/02/2022 Target Resolution Date: 04/13/2022 Goal Status: Active Patient/caregiver will verbalize adequate pain control between visits Date Initiated: 01/02/2022 Target Resolution Date: 04/13/2022 Goal Status: Active Interventions: Encourage patient to take pain medications as prescribed Provide education on pain management Reposition patient for comfort Treatment Activities: Administer pain control measures as ordered : 01/02/2022 Notes: Pressure Nursing Diagnoses: Knowledge  deficit related to management of pressures ulcers Goals: Patient/caregiver will verbalize risk factors for pressure ulcer development Date Initiated: 01/02/2022 Target Resolution Date: 04/13/2022 Goal Status: Active Interventions: Assess: immobility, friction, shearing, incontinence upon admission and as needed Assess offloading mechanisms upon admission and as needed Provide education on pressure ulcers Treatment Activities: T ordered outside of clinic : 01/02/2022 est Notes: Electronic Signature(s) Signed: 04/24/2022 4:47:24 PM By: [name] Entered By: [name] on 04/24/2022 16:16:24 -------------------------------------------------------------------------------- Pain Assessment Details Patient Name: Date of Service: [name] 04/24/2022 3:00 PM Medical Record Number: [id number] Patient Account Number: [id number] Date of Birth/Sex: Treating RN: 07/13/1950 (72 y.o. F) Primary Care Dereka Lueras: [name], [name] Other Clinician: Referring Kaemon Barnett: Treating Bren Steers/Extender: [name] in Treatment: [address] CHANTEAL, DEOL R ([id number]) [id number] Page 5 of 7 Location of Pain Severity and  Description of Pain Patient Has Paino No Site Locations Pain Management and Medication Current Pain Management: Electronic Signature(s) Signed: 04/26/2022 12:03:25 PM By: Erenest Blank Entered By: Erenest Blank on 04/24/2022 15:24:05 -------------------------------------------------------------------------------- Patient/Caregiver Education Details Patient Name: Date of Service: [name] 2/14/2024andnbsp3:00 PM Medical Record Number: [id number] Patient Account Number: [id number] Date of Birth/Gender: Treating RN: Sep 25, 1950 (72 y.o. [name], [name] Primary Care Physician: [name], [name] Other Clinician: Referring Physician: Treating Physician/Extender: [name] in Treatment: 16 Education Assessment Education Provided To: Patient Education Topics Provided Wound/Skin Impairment: Methods: Explain/Verbal Responses: Reinforcements needed, State content correctly [employer]) Signed: 04/24/2022 4:47:24 PM By: [name] Entered By: [name] on 04/24/2022 16:16:36 -------------------------------------------------------------------------------- Wound Assessment Details Patient Name: Date of Service: [name] 04/24/2022 3:00 PM GREGORIA, VITO R [phone]) [phone].pdf Page 6 of 7 Medical Record Number: [id number] Patient Account Number: [id number] Date of Birth/Sex: Treating RN: January 01, 1951 (72 y.o. F) Primary Care Trenden Hazelrigg: [name], [name] Other Clinician: Referring Tianah Lonardo: Treating Deena Shaub/Extender: [name] in Treatment: 16 Wound Status Wound Number: 1 Primary Etiology: Pressure Ulcer Wound Location: Sacrum Wound Status: Open Wounding Event: Pressure Injury Comorbid History: Colitis Date Acquired: 10/09/2021 Weeks Of Treatment: 16 Clustered Wound: No Photos Wound Measurements Length: (cm) 0.6 Width: (cm) 0.4 Depth:  (cm) 1 Area: (cm) 0.188 Volume: (cm) 0.188 % Reduction in Area: 46.7% % Reduction in Volume: 23.9% Epithelialization: None Tunneling: No Undermining: Yes Starting Position (o'clock): 5 Ending Position (o'clock): 10 Maximum Distance: (cm) 2 Wound Description Classification: Category/Stage IV Exudate Amount: Medium Exudate Type: Purulent Exudate Color: yellow, brown, green Foul Odor After Cleansing: No Slough/Fibrino Yes Wound Bed Granulation Amount: Large (67-100%) Granulation Quality: Pink Periwound Skin Texture Texture Color No Abnormalities Noted: No No Abnormalities Noted: No Callus: No Atrophie Blanche: No Crepitus: No Cyanosis: No Excoriation: No Ecchymosis: No Induration: No Erythema: No Rash: No Hemosiderin Staining: No Scarring: No Mottled: No Pallor: No Moisture Rubor: No No Abnormalities Noted: No Dry / Scaly: Yes Temperature / Pain Maceration: No Temperature: No Abnormality Treatment Notes Wound #1 (Sacrum) Cleanser Wound Cleanser Discharge Instruction: Cleanse the wound with wound cleanser prior to applying a clean dressing using gauze sponges, not tissue or cotton balls. Peri-Wound Care ARANTZA, KELLERMANN R ([id number]) [id number] Page 7 of 7 Skin Prep Discharge Instruction: Use skin prep as directed Topical Gentamicin Discharge Instruction: As directed by physician Primary Dressing Hydrofera Blue Ready Transfer Foam, 2.5x2.5 (in/in) Discharge Instruction: Apply directly to wound bed as directed Secondary Dressing Zetuvit Plus Silicone Border Dressing 4x4 (in/in) Discharge Instruction:  Apply silicone border over primary dressing as directed. Secured With Compression Wrap Compression Stockings Add-Ons Electronic Signature(s) Signed: 04/26/2022 12:03:25 PM By: Erenest Blank Entered By: Erenest Blank on 04/24/2022 15:33:19 -------------------------------------------------------------------------------- Vitals  Details Patient Name: Date of Service: [name] 04/24/2022 3:00 PM Medical Record Number: [id number] Patient Account Number: [id number] Date of Birth/Sex: Treating RN: 09/01/1950 (72 y.o. F) Primary Care Ertha Nabor: [name], [name] Other Clinician: Referring Cederic Mozley: Treating Mishti Swanton/Extender: [name] in Treatment: 16 Vital Signs Time Taken: 15:23 Temperature (F): 97.8 Height (in): 64 Pulse (bpm): 71 Weight (lbs): 97 Respiratory Rate (breaths/min): 18 Body Mass Index (BMI): 16.6 Blood Pressure (mmHg): 95/58 Reference Range: 80 - 120 mg / dl Electronic Signature(s) Signed: 04/26/2022 12:03:25 PM By: Erenest Blank Entered By: Erenest Blank on 04/24/2022 15:23:57

## 2022-04-28 NOTE — Progress Notes (Signed)
[name], [name] ([id number]) [id number] Page 1 of 1 Visit Report for 04/26/2022 SuperBill Details Patient Name: Date of Service: [name] 04/26/2022 Medical Record Number: [id number] Patient Account Number: [id number] Date of Birth/Sex: Treating RN: 1951-01-19 (72 y.o. [name], [name] Primary Care Provider: [name], [name] Other Clinician: Referring Provider: Treating Provider/Extender: Dalene Seltzer in Treatment: 16 Diagnosis Coding ICD-10 Codes Code Description [phone] Pressure ulcer of sacral region, stage 4 M62.81 Muscle weakness (generalized) E43 Unspecified severe protein-calorie malnutrition Facility Procedures CPT4 Code Description Modifier Quantity [id number] [phone] - WOUND CARE VISIT-LEV 3 EST PT 1 Electronic Signature(s) Signed: 04/26/2022 10:13:15 AM By: Kalman Shan DO Signed: 04/28/2022 2:19:36 PM By: Deon Pilling RN, BSN Entered By: Deon Pilling on 04/26/2022 09:33:28

## 2022-04-28 NOTE — Progress Notes (Signed)
ADAR, FERDON R ([id number]) [id number] Page 1 of 4 Visit Report for 04/26/2022 Arrival Information Details Patient Name: Date of Service: [name] 04/26/2022 10:15 A M Medical Record Number: [id number] Patient Account Number: [id number] Date of Birth/Sex: Treating RN: 02-29-1951 (72 y.o. [name], Meta.Reding Primary Care Bryar Rennie: [name], [name] Other Clinician: Referring Vickey Boak: Treating Hannah Crill/Extender: Dalene Seltzer in Treatment: 16 Visit Information History Since Last Visit Added or deleted any medications: No Patient Arrived: Ambulatory Any new allergies or adverse reactions: No Arrival Time: 09:32 Had a fall or experienced change in No Accompanied By: self activities of daily living that may affect Transfer Assistance: None risk of falls: Patient Identification Verified: Yes Signs or symptoms of abuse/neglect since last visito No Secondary Verification Process Completed: Yes Hospitalized since last visit: No Patient Requires Transmission-Based Precautions: No Implantable device outside of the clinic excluding No Patient Has Alerts: No cellular tissue based products placed in the center since last visit: Has Dressing in Place as Prescribed: Yes Pain Present Now: No Electronic Signature(s) Signed: 04/28/2022 2:19:36 PM By: Deon Pilling RN, BSN Entered By: Deon Pilling on 04/26/2022 09:32:29 -------------------------------------------------------------------------------- Clinic Level of Care Assessment Details Patient Name: Date of Service: [name] 04/26/2022 10:15 A M Medical Record Number: [id number] Patient Account Number: [id number] Date of Birth/Sex: Treating RN: 25-Jan-1951 (72 y.o. [name], Meta.Reding Primary Care Leinaala Catanese: [name], [name] Other Clinician: Referring Rebbecca Osuna: Treating Indyah Saulnier/Extender: Genice Rouge., [name] in Treatment: 16 Clinic Level of Care  Assessment Items TOOL 4 Quantity Score X- 1 0 Use when only an EandM is performed on FOLLOW-UP visit ASSESSMENTS - Nursing Assessment / Reassessment X- 1 10 Reassessment of Co-morbidities (includes updates in patient status) X- 1 5 Reassessment of Adherence to Treatment Plan ASSESSMENTS - Wound and Skin A ssessment / Reassessment []$  - 0 Simple Wound Assessment / Reassessment - one wound []$  - 0 Complex Wound Assessment / Reassessment - multiple wounds []$  - 0 Dermatologic / Skin Assessment (not related to wound area) ASSESSMENTS - Focused Assessment []$  - 0 Circumferential Edema Measurements - multi extremities []$  - 0 Nutritional Assessment / Counseling / Intervention CLARIA, GIARRUSSO R ([id number]) [id number] Page 2 of 4 []$  - 0 Lower Extremity Assessment (monofilament, tuning fork, pulses) []$  - 0 Peripheral Arterial Disease Assessment (using hand held doppler) ASSESSMENTS - Ostomy and/or Continence Assessment and Care []$  - 0 Incontinence Assessment and Management []$  - 0 Ostomy Care Assessment and Management (repouching, etc.) PROCESS - Coordination of Care X - Simple Patient / Family Education for ongoing care 1 15 []$  - 0 Complex (extensive) Patient / Family Education for ongoing care X- 1 10 Staff obtains Programmer, systems, Records, T Results / Process Orders est []$  - 0 Staff telephones HHA, Nursing Homes / Clarify orders / etc []$  - 0 Routine Transfer to another Facility (non-emergent condition) []$  - 0 Routine Hospital Admission (non-emergent condition) []$  - 0 New Admissions / Biomedical engineer / Ordering NPWT Apligraf, etc. , []$  - 0 Emergency Hospital Admission (emergent condition) X- 1 10 Simple Discharge Coordination []$  - 0 Complex (extensive) Discharge Coordination PROCESS - Special Needs []$  - 0 Pediatric / Minor Patient Management []$  - 0 Isolation Patient Management []$  - 0 Hearing / Language / Visual special needs []$  -  0 Assessment of Community assistance (transportation, D/C planning, etc.) []$  - 0 Additional assistance / Altered mentation []$  - 0 Support Surface(s) Assessment (bed, cushion, seat, etc.) INTERVENTIONS - Wound Cleansing /  Measurement X - Simple Wound Cleansing - one wound 1 5 []$  - 0 Complex Wound Cleansing - multiple wounds X- 1 5 Wound Imaging (photographs - any number of wounds) []$  - 0 Wound Tracing (instead of photographs) X- 1 5 Simple Wound Measurement - one wound []$  - 0 Complex Wound Measurement - multiple wounds INTERVENTIONS - Wound Dressings X - Small Wound Dressing one or multiple wounds 1 10 []$  - 0 Medium Wound Dressing one or multiple wounds []$  - 0 Large Wound Dressing one or multiple wounds X- 1 5 Application of Medications - topical []$  - 0 Application of Medications - injection INTERVENTIONS - Miscellaneous []$  - 0 External ear exam []$  - 0 Specimen Collection (cultures, biopsies, blood, body fluids, etc.) []$  - 0 Specimen(s) / Culture(s) sent or taken to Lab for analysis []$  - 0 Patient Transfer (multiple staff / Civil Service fast streamer / Similar devices) []$  - 0 Simple Staple / Suture removal (25 or less) []$  - 0 Complex Staple / Suture removal (26 or more) []$  - 0 Hypo / Hyperglycemic Management (close monitor of Blood Glucose) MILENE, BAKKER R ([id number].pdf Page 3 of 4 []$  - 0 Ankle / Brachial Index (ABI) - do not check if billed separately []$  - 0 Vital Signs Has the patient been seen at the hospital within the last three years: Yes Total Score: 80 Level Of Care: New/Established - Level 3 Electronic Signature(s) Signed: 04/28/2022 2:19:36 PM By: Deon Pilling RN, BSN Entered By: Deon Pilling on 04/26/2022 09:33:50 -------------------------------------------------------------------------------- Encounter Discharge Information Details Patient Name: Date of Service: [name], [name] 04/26/2022 10:15 A M Medical Record Number:  [id number] Patient Account Number: [id number] Date of Birth/Sex: Treating RN: November 03, 1950 (72 y.o. [name], Meta.Reding Primary Care Ray Glacken: [name], [name] Other Clinician: Referring Whitnee Orzel: Treating Mellanie Bejarano/Extender: Genice Rouge., [name] in Treatment: 16 Encounter Discharge Information Items Discharge Condition: Stable Ambulatory Status: Ambulatory Discharge Destination: Home Transportation: Private Auto Accompanied By: self Schedule Follow-up Appointment: Yes Clinical Summary of Care: Electronic Signature(s) Signed: 04/28/2022 2:19:36 PM By: Deon Pilling RN, BSN Entered By: Deon Pilling on 04/26/2022 09:32:56 -------------------------------------------------------------------------------- Wound Assessment Details Patient Name: Date of Service: [name] 04/26/2022 10:15 A M Medical Record Number: [id number] Patient Account Number: [id number] Date of Birth/Sex: Treating RN: [birth year] (72 y.o. [name], Meta.Reding Primary Care Bama Hanselman: [name], [name] Other Clinician: Referring Aneesah Hernan: Treating Sary Bogie/Extender: Dalene Seltzer in Treatment: 16 Wound Status Wound Number: 1 Primary Etiology: Pressure Ulcer Wound Location: Sacrum Wound Status: Open Wounding Event: Pressure Injury Date Acquired: 10/09/2021 Weeks Of Treatment: 16 Clustered Wound: No Wound Measurements Length: (cm) 0.6 Width: (cm) 0.4 Depth: (cm) 1 Area: (cm) 0.188 Volume: (cm) 0.188 % Reduction in Area: 46.7% % Reduction in Volume: 23.9% Wound Description ALIANNY, SILVERSMITH R ([id number]) Classification: Category/Stage IV Exudate Amount: Medium Exudate Type: Purulent Exudate Color: yellow, brown, green [id number] Page 4 of 4 Periwound Skin Texture Texture Color No Abnormalities Noted: No No Abnormalities Noted: No Moisture No Abnormalities Noted: No Treatment Notes Wound #1 (Sacrum) Cleanser Wound  Cleanser Discharge Instruction: Cleanse the wound with wound cleanser prior to applying a clean dressing using gauze sponges, not tissue or cotton balls. Peri-Wound Care Skin Prep Discharge Instruction: Use skin prep as directed Topical Gentamicin Discharge Instruction: As directed by physician Primary Dressing Hydrofera Blue Ready Transfer Foam, 2.5x2.5 (in/in) Discharge Instruction: Apply directly to wound bed as directed Secondary Dressing Zetuvit Plus Silicone Border Dressing 4x4 (in/in) Discharge Instruction:  Apply silicone border over primary dressing as directed. Secured With Compression Wrap Compression Stockings Environmental education officer) Signed: 04/28/2022 2:19:36 PM By: Deon Pilling RN, BSN Entered By: Deon Pilling on 04/26/2022 09:32:37

## 2022-04-29 ENCOUNTER — Encounter (HOSPITAL_BASED_OUTPATIENT_CLINIC_OR_DEPARTMENT_OTHER): Payer: 59 | Admitting: Internal Medicine

## 2022-04-29 DIAGNOSIS — E43 Unspecified severe protein-calorie malnutrition: Secondary | ICD-10-CM | POA: Diagnosis not present

## 2022-04-29 DIAGNOSIS — L89154 Pressure ulcer of sacral region, stage 4: Secondary | ICD-10-CM | POA: Diagnosis not present

## 2022-04-29 DIAGNOSIS — M6281 Muscle weakness (generalized): Secondary | ICD-10-CM | POA: Diagnosis not present

## 2022-04-29 NOTE — Progress Notes (Signed)
Cardenas, Marissa R ([id number]) [id number] Page 1 of 7 Visit Report for 03/27/2022 Arrival Information Details Patient Name: Date of Service: [name] 03/27/2022 12:30 PM Medical Record Number: [id number] Patient Account Number: [id number] Date of Birth/Sex: Treating RN: 09/01/1950 (72 y.o. [name], [name] Primary Care Bleu Moisan: [name], [name] Other Clinician: Referring Kellyjo Edgren: Treating Tatia Petrucci/Extender: [name] in Treatment: 12 Visit Information History Since Last Visit Added or deleted any medications: No Patient Arrived: Ambulatory Any new allergies or adverse reactions: No Arrival Time: 12:37 Had a fall or experienced change in No Accompanied By: aide activities of daily living that may affect Transfer Assistance: None risk of falls: Patient Identification Verified: Yes Signs or symptoms of abuse/neglect since last visito No Secondary Verification Process Completed: Yes Hospitalized since last visit: No Patient Requires Transmission-Based Precautions: No Implantable device outside of the clinic excluding No Patient Has Alerts: No cellular tissue based products placed in the center since last visit: Has Dressing in Place as Prescribed: Yes Pain Present Now: No Electronic Signature(s) Signed: 04/29/2022 10:40:58 AM By: [name] Entered By: [name] on 03/27/2022 12:38:09 -------------------------------------------------------------------------------- Clinic Level of Care Assessment Details Patient Name: Date of Service: [name], [name] 03/27/2022 12:30 PM Medical Record Number: [id number] Patient Account Number: [id number] Date of Birth/Sex: Treating RN: 12/25/1950 (72 y.o. [name], [name] Primary Care Olivier Frayre: [name], [name] Other Clinician: Referring Majour Frei: Treating Garyn Arlotta/Extender: [name] in Treatment: 12 Clinic Level  of Care Assessment Items TOOL 4 Quantity Score X- 1 0 Use when only an EandM is performed on FOLLOW-UP visit ASSESSMENTS - Nursing Assessment / Reassessment X- 1 10 Reassessment of Co-morbidities (includes updates in patient status) X- 1 5 Reassessment of Adherence to Treatment Plan ASSESSMENTS - Wound and Skin A ssessment / Reassessment X - Simple Wound Assessment / Reassessment - one wound 1 5 []$  - 0 Complex Wound Assessment / Reassessment - multiple wounds []$  - 0 Dermatologic / Skin Assessment (not related to wound area) ASSESSMENTS - Focused Assessment []$  - 0 Circumferential Edema Measurements - multi extremities []$  - 0 Nutritional Assessment / Counseling / Intervention TRENIDY, Marissa R ([id number]) [phone].pdf Page 2 of 7 []$  - 0 Lower Extremity Assessment (monofilament, tuning fork, pulses) []$  - 0 Peripheral Arterial Disease Assessment (using hand held doppler) ASSESSMENTS - Ostomy and/or Continence Assessment and Care []$  - 0 Incontinence Assessment and Management []$  - 0 Ostomy Care Assessment and Management (repouching, etc.) PROCESS - Coordination of Care X - Simple Patient / Family Education for ongoing care 1 15 []$  - 0 Complex (extensive) Patient / Family Education for ongoing care X- 1 10 Staff obtains Programmer, systems, Records, T Results / Process Orders est []$  - 0 Staff telephones HHA, Nursing Homes / Clarify orders / etc []$  - 0 Routine Transfer to another Facility (non-emergent condition) []$  - 0 Routine Hospital Admission (non-emergent condition) []$  - 0 New Admissions / Biomedical engineer / Ordering NPWT Apligraf, etc. , []$  - 0 Emergency Hospital Admission (emergent condition) X- 1 10 Simple Discharge Coordination []$  - 0 Complex (extensive) Discharge Coordination PROCESS - Special Needs []$  - 0 Pediatric / Minor Patient Management []$  - 0 Isolation Patient Management []$  - 0 Hearing / Language / Visual special needs []$  -  0 Assessment of Community assistance (transportation, D/C planning, etc.) []$  - 0 Additional assistance / Altered mentation []$  - 0 Support Surface(s) Assessment (bed, cushion, seat, etc.) INTERVENTIONS - Wound Cleansing /  Measurement X - Simple Wound Cleansing - one wound 1 5 []$  - 0 Complex Wound Cleansing - multiple wounds X- 1 5 Wound Imaging (photographs - any number of wounds) []$  - 0 Wound Tracing (instead of photographs) X- 1 5 Simple Wound Measurement - one wound []$  - 0 Complex Wound Measurement - multiple wounds INTERVENTIONS - Wound Dressings X - Small Wound Dressing one or multiple wounds 1 10 []$  - 0 Medium Wound Dressing one or multiple wounds []$  - 0 Large Wound Dressing one or multiple wounds []$  - 0 Application of Medications - topical []$  - 0 Application of Medications - injection INTERVENTIONS - Miscellaneous []$  - 0 External ear exam []$  - 0 Specimen Collection (cultures, biopsies, blood, body fluids, etc.) []$  - 0 Specimen(s) / Culture(s) sent or taken to Lab for analysis []$  - 0 Patient Transfer (multiple staff / Civil Service fast streamer / Similar devices) []$  - 0 Simple Staple / Suture removal (25 or less) []$  - 0 Complex Staple / Suture removal (26 or more) []$  - 0 Hypo / Hyperglycemic Management (close monitor of Blood Glucose) Cardenas, Marissa R ([id number]) [phone].pdf Page 3 of 7 []$  - 0 Ankle / Brachial Index (ABI) - do not check if billed separately X- 1 5 Vital Signs Has the patient been seen at the hospital within the last three years: Yes Total Score: 85 Level Of Care: New/Established - Level 3 Electronic Signature(s) Signed: 04/29/2022 10:40:58 AM By: [name] Entered By: [name] on 03/27/2022 13:04:37 -------------------------------------------------------------------------------- Encounter Discharge Information Details Patient Name: Date of Service: [name] 03/27/2022 12:30 PM Medical Record  Number: [id number] Patient Account Number: [id number] Date of Birth/Sex: Treating RN: 1951-02-07 (72 y.o. [name], [name] Primary Care Lennox Leikam: [name], [name] Other Clinician: Referring Manju Kulkarni: Treating Jaleigh Mccroskey/Extender: [name] in Treatment: 12 Encounter Discharge Information Items Discharge Condition: Stable Ambulatory Status: Ambulatory Discharge Destination: Home Transportation: Private Auto Accompanied By: aide Schedule Follow-up Appointment: Yes Clinical Summary of Care: Patient Declined Electronic Signature(s) Signed: 04/29/2022 10:40:58 AM By: [name] Entered By: [name] on 03/27/2022 13:27:24 -------------------------------------------------------------------------------- Lower Extremity Assessment Details Patient Name: Date of Service: [name] 03/27/2022 12:30 PM Medical Record Number: [id number] Patient Account Number: [id number] Date of Birth/Sex: Treating RN: 09-01-1950 (72 y.o. [name], [name] Primary Care Ashawn Rinehart: [name], [name] Other Clinician: Referring Jerre Diguglielmo: Treating Cadon Raczka/Extender: [name] in Treatment: 12 Electronic Signature(s) Signed: 04/29/2022 10:40:58 AM By: [name] Entered By: [name] on 03/27/2022 12:40:50 -------------------------------------------------------------------------------- [place] Details Patient Name: Date of Service: [name] 03/27/2022 12:30 PM Medical Record Number: [id number] Patient Account Number: [id number] KEAUNA, BOLAM R ([id number]) [id number] Page 4 of 7 Date of Birth/Sex: Treating RN: Dec 09, 1950 (71 y.o. [name], [name] Primary Care Adeja Sarratt: Other Clinician: [name], [name] Referring Darius Lundberg: Treating Caleel Kiner/Extender: [name] in Treatment: 12 Active Inactive Pain, Acute  or Chronic Nursing Diagnoses: Pain, acute or chronic: actual or potential Potential alteration in comfort, pain Goals: Patient will verbalize adequate pain control and receive pain control interventions during procedures as needed Date Initiated: 01/02/2022 Target Resolution Date: 04/13/2022 Goal Status: Active Patient/caregiver will verbalize adequate pain control between visits Date Initiated: 01/02/2022 Target Resolution Date: 04/13/2022 Goal Status: Active Interventions: Encourage patient to take pain medications as prescribed Provide education on pain management Reposition patient for comfort Treatment Activities: Administer pain control measures as ordered :  01/02/2022 Notes: Pressure Nursing Diagnoses: Knowledge deficit related to management of pressures ulcers Goals: Patient/caregiver will verbalize risk factors for pressure ulcer development Date Initiated: 01/02/2022 Target Resolution Date: 04/13/2022 Goal Status: Active Interventions: Assess: immobility, friction, shearing, incontinence upon admission and as needed Assess offloading mechanisms upon admission and as needed Provide education on pressure ulcers Treatment Activities: T ordered outside of clinic : 01/02/2022 est Notes: Electronic Signature(s) Signed: 04/29/2022 10:40:58 AM By: [name] Entered By: [name] on 03/27/2022 12:53:52 -------------------------------------------------------------------------------- Pain Assessment Details Patient Name: Date of Service: [name] 03/27/2022 12:30 PM Medical Record Number: [id number] Patient Account Number: [id number] Date of Birth/Sex: Treating RN: 02-Dec-1950 (72 y.o. [name], [name] Primary Care Avonne Berkery: [name], [name] Other Clinician: Referring Braelon Sprung: Treating Tyarra Nolton/Extender: [name] in Treatment: [address] LEEANDRA, OELKE R ([id number])  [id number] Page 5 of 7 Location of Pain Severity and Description of Pain Patient Has Paino No Site Locations Pain Management and Medication Current Pain Management: Electronic Signature(s) Signed: 04/29/2022 10:40:58 AM By: [name] Entered By: [name] on 03/27/2022 12:40:44 -------------------------------------------------------------------------------- Patient/Caregiver Education Details Patient Name: Date of Service: [name] 1/17/2024andnbsp12:30 PM Medical Record Number: [id number] Patient Account Number: [id number] Date of Birth/Gender: Treating RN: 06/15/1950 (72 y.o. [name], [name] Primary Care Physician: [name], [name] Other Clinician: Referring Physician: Treating Physician/Extender: [name] in Treatment: 12 Education Assessment Education Provided To: Patient Education Topics Provided Wound/Skin Impairment: Methods: Explain/Verbal Responses: Reinforcements needed, State content correctly [employer]) Signed: 04/29/2022 10:40:58 AM By: [name] Entered By: [name] on 03/27/2022 12:54:05 -------------------------------------------------------------------------------- Wound Assessment Details Patient Name: Date of Service: [name] 03/27/2022 12:30 PM AUBREANNA, VANDERBUSH R ([id number]) [phone].pdf Page 6 of 7 Medical Record Number: [id number] Patient Account Number: [id number] Date of Birth/Sex: Treating RN: 1950/06/29 (72 y.o. [name], [name] Primary Care Jebadiah Imperato: [name], [name] Other Clinician: Referring Synai Prettyman: Treating Jamarea Selner/Extender: [name] in Treatment: 12 Wound Status Wound Number: 1 Primary Etiology: Pressure Ulcer Wound Location: Sacrum Wound Status: Open Wounding Event: Pressure Injury Comorbid History: Colitis Date Acquired: 10/09/2021 Weeks  Of Treatment: 12 Clustered Wound: No Photos Wound Measurements Length: (cm) 0.4 Width: (cm) 0.2 Depth: (cm) 1 Area: (cm) 0.063 Volume: (cm) 0.063 % Reduction in Area: 82.2% % Reduction in Volume: 74.5% Tunneling: No Undermining: Yes Starting Position (o'clock): 12 Ending Position (o'clock): 12 Maximum Distance: (cm) 4 Wound Description Classification: Category/Stage IV Exudate Amount: Medium Exudate Type: Serosanguineous Exudate Color: red, brown Wound Bed Granulation Amount: Large (67-100%) Exposed Structure Necrotic Amount: Small (1-33%) Fat Layer (Subcutaneous Tissue) Exposed: Yes Necrotic Quality: Adherent Slough Periwound Skin Texture Texture Color No Abnormalities Noted: No No Abnormalities Noted: No Callus: No Atrophie Blanche: No Crepitus: No Cyanosis: No Excoriation: No Ecchymosis: No Induration: No Erythema: No Rash: No Hemosiderin Staining: No Scarring: No Mottled: No Pallor: No Moisture Rubor: No No Abnormalities Noted: No Dry / Scaly: No Temperature / Pain Maceration: No Temperature: No Abnormality Electronic Signature(s) Signed: 04/29/2022 10:40:58 AM By: [name] Entered By: [name] on 03/27/2022 12:45:55 Romero Liner R ([id number]) [id number] Page 7 of 7 -------------------------------------------------------------------------------- Vitals Details Patient Name: Date of Service: [name] 03/27/2022 12:30 PM Medical Record Number: [id number] Patient Account Number: [id number] Date of Birth/Sex: Treating RN: [birth year] (72 y.o. [name], [name] Primary Care Kourtland Coopman: [name], [name] Other Clinician: Referring Lyly Canizales: Treating Zigmond Trela/Extender:  Stone III, Jeanie Cooks., Robert Weeks in Treatment: 12 Vital Signs Time Taken: 12:38 Temperature (F): 98.7 Height (in): 64 Pulse (bpm): 74 Weight (lbs): 97 Respiratory Rate (breaths/min): 17 Body Mass Index (BMI):  16.6 Blood Pressure (mmHg): 115/74 Reference Range: 80 - 120 mg / dl Electronic Signature(s) Signed: 04/29/2022 10:40:58 AM By: [name] Entered By: [name] on 03/27/2022 12:40:38

## 2022-04-30 NOTE — Progress Notes (Signed)
CRICKETT, STOCKSDALE R ([id number]) [id number] Page 1 of 1 Visit Report for 04/29/2022 SuperBill Details Patient Name: Date of Service: [name] 04/29/2022 Medical Record Number: [id number] Patient Account Number: [id number] Date of Birth/Sex: Treating RN: 1950-11-16 (72 y.o. [name] Primary Care Provider: [name], [name] Other Clinician: Referring Provider: Treating Provider/Extender: Genice Rouge., [name] in Treatment: 16 Diagnosis Coding ICD-10 Codes Code Description [phone] Pressure ulcer of sacral region, stage 4 M62.81 Muscle weakness (generalized) E43 Unspecified severe protein-calorie malnutrition Facility Procedures CPT4 Code Description Modifier Quantity [id number] [phone] - WOUND CARE VISIT-LEV 2 EST PT 1 Electronic Signature(s) Signed: 04/29/2022 3:39:58 PM By: Kalman Shan DO Signed: 04/29/2022 4:17:27 PM By: Sharyn Creamer RN, BSN Entered By: Sharyn Creamer on 04/29/2022 11:29:11

## 2022-04-30 NOTE — Progress Notes (Signed)
[name], [name] ([id number]) [id number] Page 1 of 5 Visit Report for 04/29/2022 Arrival Information Details Patient Name: Date of Service: [name] 04/29/2022 7:45 A M Medical Record Number: [id number] Patient Account Number: [id number] Date of Birth/Sex: Treating RN: May 03, 1950 (72 y.o. [name] Primary Care [name]: Marissa Mac., [name] Other Clinician: Referring Kelda Azad: Treating Sherrel Shafer/Extender: Genice Rouge., [name] in Treatment: 70 Visit Information History Since Last Visit Added or deleted any medications: No Patient Arrived: Ambulatory Any new allergies or adverse reactions: No Arrival Time: 11:26 Had a fall or experienced change in No Accompanied By: self activities of daily living that may affect Transfer Assistance: None risk of falls: Patient Identification Verified: Yes Signs or symptoms of abuse/neglect since last visito No Secondary Verification Process Completed: Yes Hospitalized since last visit: No Patient Requires Transmission-Based Precautions: No Implantable device outside of the clinic excluding No Patient Has Alerts: No cellular tissue based products placed in the center since last visit: Has Dressing in Place as Prescribed: Yes Pain Present Now: No Electronic Signature(s) Signed: 04/29/2022 4:17:27 PM By: Sharyn Creamer RN, BSN Entered By: Sharyn Creamer on 04/29/2022 11:26:35 -------------------------------------------------------------------------------- Clinic Level of Care Assessment Details Patient Name: Date of Service: [name] 04/29/2022 7:45 A M Medical Record Number: [id number] Patient Account Number: [id number] Date of Birth/Sex: Treating RN: March 11, 1951 (72 y.o. [name] Primary Care Graig Hessling: Marissa Mac., [name] Other Clinician: Referring Billye Pickerel: Treating Khylan Sawyer/Extender: Genice Rouge., [name] in Treatment: 16 Clinic Level of  Care Assessment Items TOOL 4 Quantity Score X- 1 0 Use when only an EandM is performed on FOLLOW-UP visit ASSESSMENTS - Nursing Assessment / Reassessment X- 1 10 Reassessment of Marissa-morbidities (includes updates in patient status) X- 1 5 Reassessment of Adherence to Treatment Plan ASSESSMENTS - Wound and Skin A ssessment / Reassessment []$  - 0 Simple Wound Assessment / Reassessment - one wound []$  - 0 Complex Wound Assessment / Reassessment - multiple wounds []$  - 0 Dermatologic / Skin Assessment (not related to wound area) ASSESSMENTS - Focused Assessment []$  - 0 Circumferential Edema Measurements - multi extremities []$  - 0 Nutritional Assessment / Counseling / Intervention [name], [name] ([id number]) [phone] Page 2 of 5 []$  - 0 Lower Extremity Assessment (monofilament, tuning fork, pulses) []$  - 0 Peripheral Arterial Disease Assessment (using hand held doppler) ASSESSMENTS - Ostomy and/or Continence Assessment and Care []$  - 0 Incontinence Assessment and Management []$  - 0 Ostomy Care Assessment and Management (repouching, etc.) PROCESS - Coordination of Care X - Simple Patient / Family Education for ongoing care 1 15 []$  - 0 Complex (extensive) Patient / Family Education for ongoing care X- 1 10 Staff obtains Programmer, systems, Records, T Results / Process Orders est []$  - 0 Staff telephones HHA, Nursing Homes / Clarify orders / etc []$  - 0 Routine Transfer to another Facility (non-emergent condition) []$  - 0 Routine Hospital Admission (non-emergent condition) []$  - 0 New Admissions / Biomedical engineer / Ordering NPWT Apligraf, etc. , []$  - 0 Emergency Hospital Admission (emergent condition) []$  - 0 Simple Discharge Coordination []$  - 0 Complex (extensive) Discharge Coordination PROCESS - Special Needs []$  - 0 Pediatric / Minor Patient Management []$  - 0 Isolation Patient Management []$  - 0 Hearing / Language / Visual special needs []$  -  0 Assessment of Community assistance (transportation, D/C planning, etc.) []$  - 0 Additional assistance / Altered mentation []$  - 0 Support Surface(s) Assessment (bed, cushion, seat, etc.) INTERVENTIONS - Wound Cleansing /  Measurement X - Simple Wound Cleansing - one wound 1 5 []$  - 0 Complex Wound Cleansing - multiple wounds []$  - 0 Wound Imaging (photographs - any number of wounds) []$  - 0 Wound Tracing (instead of photographs) []$  - 0 Simple Wound Measurement - one wound []$  - 0 Complex Wound Measurement - multiple wounds INTERVENTIONS - Wound Dressings X - Small Wound Dressing one or multiple wounds 1 10 []$  - 0 Medium Wound Dressing one or multiple wounds []$  - 0 Large Wound Dressing one or multiple wounds X- 1 5 Application of Medications - topical []$  - 0 Application of Medications - injection INTERVENTIONS - Miscellaneous []$  - 0 External ear exam []$  - 0 Specimen Collection (cultures, biopsies, blood, body fluids, etc.) []$  - 0 Specimen(s) / Culture(s) sent or taken to Lab for analysis []$  - 0 Patient Transfer (multiple staff / Civil Service fast streamer / Similar devices) []$  - 0 Simple Staple / Suture removal (25 or less) []$  - 0 Complex Staple / Suture removal (26 or more) []$  - 0 Hypo / Hyperglycemic Management (close monitor of Blood Glucose) [name], [name] ([id number]) [phone].pdf Page 3 of 5 []$  - 0 Ankle / Brachial Index (ABI) - do not check if billed separately []$  - 0 Vital Signs Has the patient been seen at the hospital within the last three years: Yes Total Score: 60 Level Of Care: New/Established - Level 2 Electronic Signature(s) Signed: 04/29/2022 4:17:27 PM By: Sharyn Creamer RN, BSN Entered By: Sharyn Creamer on 04/29/2022 11:28:22 -------------------------------------------------------------------------------- Encounter Discharge Information Details Patient Name: Date of Service: [name]. 04/29/2022 7:45 A M Medical Record Number:  [id number] Patient Account Number: [id number] Date of Birth/Sex: Treating RN: Feb 28, 1951 (72 y.o. [name] Primary Care Marissa Mckiver: Marissa Mac., [name] Other Clinician: Referring Yanixan Mellinger: Treating Deidrick Rainey/Extender: Genice Rouge., [name] in Treatment: 16 Encounter Discharge Information Items Discharge Condition: Stable Ambulatory Status: Ambulatory Discharge Destination: Home Transportation: Private Auto Accompanied By: self Schedule Follow-up Appointment: Yes Clinical Summary of Care: Patient Declined Electronic Signature(s) Signed: 04/29/2022 4:17:27 PM By: Sharyn Creamer RN, BSN Entered By: Sharyn Creamer on 04/29/2022 11:28:57 -------------------------------------------------------------------------------- Patient/Caregiver Education Details Patient Name: Date of Service: [name] 2/19/2024andnbsp7:45 [place] Record Number: [id number] Patient Account Number: [id number] Date of Birth/Gender: Treating RN: [birth year] (72 y.o. [name] Primary Care Physician: Marissa Mac., [name] Other Clinician: Referring Physician: Treating Physician/Extender: Genice Rouge., [name] in Treatment: 16 Education Assessment Education Provided To: Patient Education Topics Provided Wound/Skin Impairment: Methods: Explain/Verbal Responses: State content correctly [employer]) Signed: 04/29/2022 4:17:27 PM By: Sharyn Creamer RN, BSN Entered By: Sharyn Creamer on 04/29/2022 11:28:44 [name], [name] ([id number]) [id number] Page 4 of 5 -------------------------------------------------------------------------------- Wound Assessment Details Patient Name: Date of Service: [name] 04/29/2022 7:45 A M Medical Record Number: [id number] Patient Account Number: [id number] Date of Birth/Sex: Treating RN: 1951-01-15 (72 y.o. [name] Primary Care Cassandre Oleksy: Marissa Mac., [name] Other  Clinician: Referring Quenna Doepke: Treating Kynnadi Dicenso/Extender: Genice Rouge., [name] in Treatment: 16 Wound Status Wound Number: 1 Primary Etiology: Pressure Ulcer Wound Location: Sacrum Wound Status: Open Wounding Event: Pressure Injury Comorbid History: Colitis Date Acquired: 10/09/2021 Weeks Of Treatment: 16 Clustered Wound: No Wound Measurements Length: (cm) 0.6 Width: (cm) 0.4 Depth: (cm) 1 Area: (cm) 0.188 Volume: (cm) 0.188 % Reduction in Area: 46.7% % Reduction in Volume: 23.9% Wound Description Classification: Category/Stage IV Exudate Amount: Medium Exudate Type: Purulent Exudate Color: yellow, brown,  green Periwound Skin Texture Texture Color No Abnormalities Noted: No No Abnormalities Noted: No Moisture No Abnormalities Noted: No Treatment Notes Wound #1 (Sacrum) Cleanser Wound Cleanser Discharge Instruction: Cleanse the wound with wound cleanser prior to applying a clean dressing using gauze sponges, not tissue or cotton balls. Peri-Wound Care Skin Prep Discharge Instruction: Use skin prep as directed Topical Gentamicin Discharge Instruction: As directed by physician Primary Dressing Hydrofera Blue Ready Transfer Foam, 2.5x2.5 (in/in) Discharge Instruction: Apply directly to wound bed as directed Secondary Dressing Zetuvit Plus Silicone Border Dressing 4x4 (in/in) Discharge Instruction: Apply silicone border over primary dressing as directed. Secured With Compression Wrap Compression Stockings [name], [name] ([id number]) [id number] Page 5 of 5 Add-Ons Electronic Signature(s) Signed: 04/29/2022 4:17:27 PM By: Sharyn Creamer RN, BSN Entered By: Sharyn Creamer on 04/29/2022 11:27:12

## 2022-05-01 ENCOUNTER — Encounter (HOSPITAL_BASED_OUTPATIENT_CLINIC_OR_DEPARTMENT_OTHER): Payer: 59 | Admitting: Physician Assistant

## 2022-05-01 DIAGNOSIS — L89154 Pressure ulcer of sacral region, stage 4: Secondary | ICD-10-CM | POA: Diagnosis not present

## 2022-05-01 DIAGNOSIS — M6281 Muscle weakness (generalized): Secondary | ICD-10-CM | POA: Diagnosis not present

## 2022-05-01 DIAGNOSIS — E43 Unspecified severe protein-calorie malnutrition: Secondary | ICD-10-CM | POA: Diagnosis not present

## 2022-05-01 DIAGNOSIS — L89153 Pressure ulcer of sacral region, stage 3: Secondary | ICD-10-CM | POA: Diagnosis not present

## 2022-05-02 NOTE — Progress Notes (Addendum)
AMIA, DUPERE R ([id number]) [id number] Page 1 of 6 Visit Report for 05/01/2022 Chief Complaint Document Details Patient Name: Date of Service: [name]. 05/01/2022 2:00 PM Medical Record Number: [id number] Patient Account Number: [id number] Date of Birth/Sex: Treating RN: 1950/06/07 (72 y.o. F) Primary Care Provider: [name], [name] Other Clinician: Referring Provider: Treating Provider/Extender: [name] in Treatment: 17 Information Obtained from: Patient Chief Complaint Pressure ulcer stage 3 Sacrum Electronic Signature(s) Signed: 05/01/2022 1:41:31 PM By: Worthy Keeler PA-C Entered By: Worthy Keeler on 05/01/2022 13:41:31 -------------------------------------------------------------------------------- HPI Details Patient Name: Date of Service: CO Marissa Compton R. 05/01/2022 2:00 PM Medical Record Number: [id number] Patient Account Number: [id number] Date of Birth/Sex: Treating RN: 1951-01-31 (72 y.o. F) Primary Care Provider: [name], [name] Other Clinician: Referring Provider: Treating Provider/Extender: [name] in Treatment: 17 History of Present Illness HPI Description: 01-02-22 upon evaluation today patient presents for initial inspection here in our clinic concerning a wound that she has over the sacral region. This is stated to be present since around the beginning of August 2023. She currently resides in a assisted nursing facility. She does seem to be able to answer questions fully today. Subsequently I do note that she is a little on the thin side but again other than the protein calorie malnutrition she is minimally weak but still does get up and move around some but she also tells me that she "sits a lot". She has not had any x-rays of the sacral region at this point. 01-09-2022 upon evaluation today patient's wound in the sacral area actually appears to be  doing decently well. Fortunately I do not see any signs of infection which is great news and overall I am extremely pleased with where things stand today. 11/8; this is a patient who lives in some form of small assisted living in [place]. She has a deep wound over the lower part of her coccyx. An x-ray that we ordered from last time did not show any osseous abnormalities. We are supposed to be using Hydrofera Blue in the wound but I am really not sure what they are using to dress this and who is doing it. Talking to our staff they have apparently discussed this with the staff in the facility. She does not have home health. 01-23-2022 upon evaluation today patient appears to be doing decently well in regard to her wound. She has been tolerating the dressing changes without complication although I am not certain the dressing changes have been done appropriately over the past couple of weeks. We have been trying to get her to come in here we also try to get her into a wound care center in [place] which would be closer for the assisted living facility. Unfortunately neither 1 of those were undertaken up to this point. The patient did end up having some training of the staff from an RN that they brought him to have the staff perform the dressing changes but that being said it does not sound like this has been done correctly. Nonetheless I do believe that based on what we see it would benefit the patient to actually have her come here 3 times a week also think a wound VAC could potentially be a possibility for her which would help to get things moving in a much better direction as far as healing is concerned. We need to get this to fill-in though it looks clean and  does not look infected I do think that we need to really get this to fill-in and there is quite a bit of undermining that is to be considered here. 01-30-2022 upon evaluation today patient appears to be doing well currently in regard to her wound  which is looking pretty decent but still has quite a bit of space underneath as far as undermining is concerned. I think she might possibly be better with a wound VAC. With that being said if when I do this we probably only be able to do it 2 times a week at most. I discussed that with the patient today she is okay with that we just need to see if we can get the insurance approval and then of course the scheduling underway. 02-13-2022 upon evaluation today patient appears to be doing well currently in regard to her wound. She actually tells me that it is feeling a lot better which is good news. Fortunately I do not see any signs of active infection at this time. No fevers, chills, nausea, vomiting, or diarrhea. 02-27-2022 upon evaluation today patient appears to be doing well currently in regard to her wound. She is actually showing some signs of improvement we are still obtaining the approval for the snap VAC which I think could be beneficial in the meantime we been using the Hydrofera Blue rope. 03-27-2022 upon evaluation today patient appears to be doing okay in regard to her wound but there was no dressing in place upon arrival today. Fortunately I see again no evidence of infection. No fevers, chills, nausea, vomiting, or diarrhea. 04-03-2022 upon evaluation today patient did not have a dressing on when she came into the office. With that being said she actually tells me that it was on until yesterday she took a shower and it came loose and then today came off completely. With that being said I really do not think she needs to be taking shower every single day or having to see her here in the clinic 3 times per week on Monday, Wednesday, and Friday and during those times obviously in between she needs to probably avoid showering. For that reason what I advised her to do would be to shower in the mornings on the days that she comes to see [place] that way if the dressing does come off or start to come off  we will be changing it anyway. She voiced understanding and tells me she can do that. Unfortunately the facility has nobody to help her with changing the dressings and we are not currently having to do that here in the clinic again. This means ARBELL, LEITH R ([id number]) [id number] Page 2 of 6 that during the time that she was not coming in from December 20 through when I saw her last week on the 17th there was a period of 2 weeks where the wound really was not changed at all as the RN that Myriam Jacobson tell me they had at the facility did not end up working out. This obviously is not good and is not what we expect to see as far as patient care is concerned which is why we are now seeing her 3 times a week here in the clinic. 04-10-2022 upon evaluation today patient appears to be doing well currently in regard to her wound which is actually showing signs of good granulation epithelization at this point. However she does have quite a bit of drainage and we are little concerned about the possibility of infection. For  that reason I think she could benefit from a PCR culture followed by [facility] topical antibiotics. I am also thinking of going ahead and doing gentamicin today as well. 2/7; small wound on the lower sacrum however with considerable degree of undermining from roughly 7-4. PCR culture that was done last week showed "no organisms". We have been using gentamicin and iodoform packing.She lives in some form of group home in [place] I believe. Uncertain about the adequacy of offloading this area 04-24-2022 upon evaluation patient's wound actually is showing signs of doing decently well I do believe she would benefit from a snap VAC and we discussed that again today. Fortunately I do not see any signs of active infection locally nor systemically which is great news. No fevers, chills, nausea, vomiting, or diarrhea. 05-01-2022 upon evaluation today patient's wound is really  doing about the same. Fortunately I do not see any signs of active infection locally nor systemically at this time which is great news with that being said we have gotten approval for the snap VAC and we will going to go ahead and place that today. Electronic Signature(s) Signed: 05/01/2022 4:35:47 PM By: Worthy Keeler PA-C Entered By: Worthy Keeler on 05/01/2022 16:35:47 -------------------------------------------------------------------------------- Physical Exam Details Patient Name: Date of Service: CO Marissa Compton R. 05/01/2022 2:00 PM Medical Record Number: [id number] Patient Account Number: [id number] Date of Birth/Sex: Treating RN: 1950-08-24 (72 y.o. F) Primary Care Provider: [name], [name] Other Clinician: Referring Provider: Treating Provider/Extender: [name] in Treatment: 36 Constitutional Well-nourished and well-hydrated in no acute distress. Respiratory normal breathing without difficulty. Psychiatric this patient is able to make decisions and demonstrates good insight into disease process. Alert and Oriented x 3. pleasant and cooperative. Notes Fortunately there does not appear to be any signs of active infection locally nor systemically which is great news and overall I am extremely pleased Upon inspection patient's wound bed actually showed signs of good granulation epithelization at this point. With where things stand today. Electronic Signature(s) Signed: 05/01/2022 4:36:02 PM By: Worthy Keeler PA-C Entered By: Worthy Keeler on 05/01/2022 16:36:02 -------------------------------------------------------------------------------- Physician Orders Details Patient Name: Date of Service: [name], [name] 05/01/2022 2:00 PM Medical Record Number: [id number] Patient Account Number: [id number] Date of Birth/Sex: Treating RN: 1950-04-12 (72 y.o. [name], [name] Primary Care Provider: [name], [name] Other  Clinician: Referring Provider: Treating Provider/Extender: [name] in Treatment: 17 Verbal / Phone Orders: No Diagnosis Coding ICD-10 Coding Code Description L89.154 Pressure ulcer of sacral region, stage 4 M62.81 Muscle weakness (generalized) E43 Unspecified severe protein-calorie malnutrition Follow-up Appointments ppointment in 1 week. - w/ Jeri Cos, PA and Lauran Rm # 7 Wednesday 05/08/22 @ 8:45 Return A KHRISTIN, SLATE R ([id number]) [id number] Page 3 of 6 Nurse Visit: - Friday 05/03/22 @ 7:45 (already has appt.) - Check snap vac to see how it's doing and if everything looks good we can change weekly. Other: - Administrator Arbutus Ped) : [phone] PCR culture negative-no [employer] ordered. Anesthetic (In clinic) Topical Lidocaine 5% applied to wound bed Negative Presssure Wound Therapy SNAP Vac to wound continuously at 170m/hg pressure - pack with hydraferablue ready first, then apply snap vac foam over top Other: - Run insurance for snap vac Off-Loading Turn and reposition every 2 hours Wound Treatment Wound #1 - Sacrum Cleanser: Wound Cleanser ([facility] 2 x Per Week/30 Days Discharge Instructions: Cleanse the wound with wound cleanser prior to applying  a clean dressing using gauze sponges, not tissue or cotton balls. Peri-Wound Care: Skin Prep (Home Health) 2 x Per Week/30 Days Discharge Instructions: Use skin prep as directed Prim Dressing: Hydrofera Blue Ready Transfer Foam, 2.5x2.5 (in/in) 2 x Per Week/30 Days ary Discharge Instructions: Apply directly to wound bed as directed Electronic Signature(s) Signed: 05/01/2022 5:24:38 PM By: Worthy Keeler PA-C Signed: 05/06/2022 3:50:45 PM By: [name] Entered By: [name] on 05/01/2022 14:41:30 -------------------------------------------------------------------------------- Problem List Details Patient Name: Date of Service: [name] 05/01/2022 2:00 PM Medical Record Number: [id number] Patient Account Number: [id number] Date of Birth/Sex: Treating RN: [birth year] (72 y.o. F) Primary Care Provider: [name], [name] Other Clinician: Referring Provider: Treating Provider/Extender: [name] in Treatment: 17 Active Problems ICD-10 Encounter Code Description Active Date MDM Diagnosis L89.154 Pressure ulcer of sacral region, stage 4 01/02/2022 No Yes M62.81 Muscle weakness (generalized) 01/02/2022 No Yes E43 Unspecified severe protein-calorie malnutrition 01/02/2022 No Yes Inactive Problems Resolved Problems Electronic Signature(s) Signed: 05/01/2022 1:41:17 PM By: Worthy Keeler PA-C Entered By: Worthy Keeler on 05/01/2022 13:41:17 Romero Liner R ([id number]) [id number] Page 4 of 6 -------------------------------------------------------------------------------- Progress Note Details Patient Name: Date of Service: [name]. 05/01/2022 2:00 PM Medical Record Number: [id number] Patient Account Number: [id number] Date of Birth/Sex: Treating RN: 03-01-1951 (73 y.o. F) Primary Care Provider: [name], [name] Other Clinician: Referring Provider: Treating Provider/Extender: [name] in Treatment: 17 Subjective Chief Complaint Information obtained from Patient Pressure ulcer stage 3 Sacrum History of Present Illness (HPI) 01-02-22 upon evaluation today patient presents for initial inspection here in our clinic concerning a wound that she has over the sacral region. This is stated to be present since around the beginning of August 2023. She currently resides in a assisted nursing facility. She does seem to be able to answer questions fully today. Subsequently I do note that she is a little on the thin side but again other than the protein calorie malnutrition she is minimally weak but still does get up and  move around some but she also tells me that she "sits a lot". She has not had any x-rays of the sacral region at this point. 01-09-2022 upon evaluation today patient's wound in the sacral area actually appears to be doing decently well. Fortunately I do not see any signs of infection which is great news and overall I am extremely pleased with where things stand today. 11/8; this is a patient who lives in some form of small assisted living in [place]. She has a deep wound over the lower part of her coccyx. An x-ray that we ordered from last time did not show any osseous abnormalities. We are supposed to be using Hydrofera Blue in the wound but I am really not sure what they are using to dress this and who is doing it. Talking to our staff they have apparently discussed this with the staff in the facility. She does not have home health. 01-23-2022 upon evaluation today patient appears to be doing decently well in regard to her wound. She has been tolerating the dressing changes without complication although I am not certain the dressing changes have been done appropriately over the past couple of weeks. We have been trying to get her to come in here we also try to get her into a wound care center in [place] which would be closer for the assisted living facility. Unfortunately  neither 1 of those were undertaken up to this point. The patient did end up having some training of the staff from an RN that they brought him to have the staff perform the dressing changes but that being said it does not sound like this has been done correctly. Nonetheless I do believe that based on what we see it would benefit the patient to actually have her come here 3 times a week also think a wound VAC could potentially be a possibility for her which would help to get things moving in a much better direction as far as healing is concerned. We need to get this to fill-in though it looks clean and does not look infected I do think  that we need to really get this to fill-in and there is quite a bit of undermining that is to be considered here. 01-30-2022 upon evaluation today patient appears to be doing well currently in regard to her wound which is looking pretty decent but still has quite a bit of space underneath as far as undermining is concerned. I think she might possibly be better with a wound VAC. With that being said if when I do this we probably only be able to do it 2 times a week at most. I discussed that with the patient today she is okay with that we just need to see if we can get the insurance approval and then of course the scheduling underway. 02-13-2022 upon evaluation today patient appears to be doing well currently in regard to her wound. She actually tells me that it is feeling a lot better which is good news. Fortunately I do not see any signs of active infection at this time. No fevers, chills, nausea, vomiting, or diarrhea. 02-27-2022 upon evaluation today patient appears to be doing well currently in regard to her wound. She is actually showing some signs of improvement we are still obtaining the approval for the snap VAC which I think could be beneficial in the meantime we been using the Hydrofera Blue rope. 03-27-2022 upon evaluation today patient appears to be doing okay in regard to her wound but there was no dressing in place upon arrival today. Fortunately I see again no evidence of infection. No fevers, chills, nausea, vomiting, or diarrhea. 04-03-2022 upon evaluation today patient did not have a dressing on when she came into the office. With that being said she actually tells me that it was on until yesterday she took a shower and it came loose and then today came off completely. With that being said I really do not think she needs to be taking shower every single day or having to see her here in the clinic 3 times per week on Monday, Wednesday, and Friday and during those times obviously  in between she needs to probably avoid showering. For that reason what I advised her to do would be to shower in the mornings on the days that she comes to see [place] that way if the dressing does come off or start to come off we will be changing it anyway. She voiced understanding and tells me she can do that. Unfortunately the facility has nobody to help her with changing the dressings and we are not currently having to do that here in the clinic again. This means that during the time that she was not coming in from December 20 through when I saw her last week on the 17th there was a period of 2 weeks where the wound  really was not changed at all as the RN that Myriam Jacobson tell me they had at the facility did not end up working out. This obviously is not good and is not what we expect to see as far as patient care is concerned which is why we are now seeing her 3 times a week here in the clinic. 04-10-2022 upon evaluation today patient appears to be doing well currently in regard to her wound which is actually showing signs of good granulation epithelization at this point. However she does have quite a bit of drainage and we are little concerned about the possibility of infection. For that reason I think she could benefit from a PCR culture followed by [facility] topical antibiotics. I am also thinking of going ahead and doing gentamicin today as well. 2/7; small wound on the lower sacrum however with considerable degree of undermining from roughly 7-4. PCR culture that was done last week showed "no organisms". We have been using gentamicin and iodoform packing.She lives in some form of group home in [place] I believe. Uncertain about the adequacy of offloading this area 04-24-2022 upon evaluation patient's wound actually is showing signs of doing decently well I do believe she would benefit from a snap VAC and we discussed that again today. Fortunately I do not see any signs of active infection locally nor  systemically which is great news. No fevers, chills, nausea, vomiting, or diarrhea. 05-01-2022 upon evaluation today patient's wound is really doing about the same. Fortunately I do not see any signs of active infection locally nor systemically at this time which is great news with that being said we have gotten approval for the snap VAC and we will going to go ahead and place that today. [address] ARLANDA, BONSALL R ([id number]) [id number] Page 5 of 6 Constitutional Well-nourished and well-hydrated in no acute distress. Vitals Time Taken: 1:53 PM, Height: 64 in, Weight: 97 lbs, BMI: 16.6, Temperature: 97.5 F, Pulse: 66 bpm, Respiratory Rate: 18 breaths/min, Blood Pressure: 101/53 mmHg. Respiratory normal breathing without difficulty. Psychiatric this patient is able to make decisions and demonstrates good insight into disease process. Alert and Oriented x 3. pleasant and cooperative. General Notes: Fortunately there does not appear to be any signs of active infection locally nor systemically which is great news and overall I am extremely pleased Upon inspection patient's wound bed actually showed signs of good granulation epithelization at this point. With where things stand today. Integumentary (Hair, Skin) Wound #1 status is Open. Original cause of wound was Pressure Injury. The date acquired was: 10/09/2021. The wound has been in treatment 17 weeks. The wound is located on the Sacrum. The wound measures 0.5cm length x 0.3cm width x 1cm depth; 0.118cm^2 area and 0.118cm^3 volume. There is no tunneling noted, however, there is undermining starting at 6:00 and ending at 7:00 with a maximum distance of 1.3cm. There is a medium amount of purulent drainage noted. There is large (67-100%) pink granulation within the wound bed. There is no necrotic tissue within the wound bed. The periwound skin appearance exhibited: Maceration. Assessment Active  Problems ICD-10 Pressure ulcer of sacral region, stage 4 Muscle weakness (generalized) Unspecified severe protein-calorie malnutrition Plan Follow-up Appointments: Return Appointment in 1 week. - w/ Jeri Cos, PA and Lauran Rm # 7 Wednesday 05/08/22 @ 8:45 Nurse Visit: - Friday 05/03/22 @ 7:45 (already has appt.) - Check snap vac to see how it's doing and if everything looks good we can change weekly. Other: - Administrator Arbutus Ped) :  [phone] PCR culture negative-no [employer] ordered. Anesthetic: (In clinic) Topical Lidocaine 5% applied to wound bed Negative Presssure Wound Therapy: SNAP Vac to wound continuously at 127m/hg pressure - pack with hydraferablue ready first, then apply snap vac foam over top Other: - Run insurance for snap vac Off-Loading: Turn and reposition every 2 hours WOUND #1: - Sacrum Wound Laterality: Cleanser: Wound Cleanser (Home Health) 2 x Per Week/30 Days Discharge Instructions: Cleanse the wound with wound cleanser prior to applying a clean dressing using gauze sponges, not tissue or cotton balls. Peri-Wound Care: Skin Prep (Home Health) 2 x Per Week/30 Days Discharge Instructions: Use skin prep as directed Prim Dressing: Hydrofera Blue Ready Transfer Foam, 2.5x2.5 (in/in) 2 x Per Week/30 Days ary Discharge Instructions: Apply directly to wound bed as directed 1. I am going to suggest that we go ahead and initiate treatment with a snap VAC working to see if we can get this healed as quickly as possible. 2. Also can recommend the patient should continue to monitor for any signs of infection or worsening. Obviously based on what we are seeing I do believe that the patient is making really good progress here but we need to get this filled and is the biggest issue. We are going to go ahead and hopefully get the granulation filling in with the snap VAC [facility] and externally were going to utilizing again the blue foam to track  along in order to secure this. This will be until we get the snap VAC kits that actually have the track pad along with it. We will see patient back for reevaluation in 1 week here in the clinic. If anything worsens or changes patient will contact our office for additional recommendations. Electronic Signature(s) Signed: 05/01/2022 4:37:00 PM By: SWorthy KeelerPA-C Entered By: SWorthy Keeleron 05/01/2022 16:37:00 -------------------------------------------------------------------------------- SuperBill Details Patient Name: Date of Service: [name], JEstill Bakes2/21/2024 CRAYNEE, IDAR ([id number] Page 6 of 6 Medical Record Number: [id number] Account Number: [id number] of Birth/Sex: Treating RN: 906/07/1950(72y.o. FTonita Phoenix [name] Primary Care Provider: FGlennon Mac, RHerbie BaltimoreOther Clinician: Referring Provider: Treating Provider/Extender: SMarijean Heathin Treatment: 17 Diagnosis Coding ICD-10 Codes Code Description [id number] ulcer of sacral region, stage 4 M62.81 Muscle weakness (generalized) E43 Unspecified severe protein-calorie malnutrition Facility Procedures : CPT4 Code: [id number]: [id number] WOUND VAC-50 SQ CM OR LESS Modifier: Quantity: 1 Physician Procedures : CPT4 Code Description Modifier [id number] - WC PHYS LEVEL 4 - EST PT ICD-10 Diagnosis Description L89.154 Pressure ulcer of sacral region, stage 4 M62.81 Muscle weakness (generalized) E43 Unspecified severe protein-calorie malnutrition Quantity: 1 Electronic Signature(s) Signed: 05/01/2022 4:41:54 PM By: SWorthy KeelerPA-C Entered By: SWorthy Keeleron 05/01/2022 16:41:54

## 2022-05-03 ENCOUNTER — Encounter (HOSPITAL_BASED_OUTPATIENT_CLINIC_OR_DEPARTMENT_OTHER): Payer: 59 | Admitting: Internal Medicine

## 2022-05-03 DIAGNOSIS — M6281 Muscle weakness (generalized): Secondary | ICD-10-CM | POA: Diagnosis not present

## 2022-05-03 DIAGNOSIS — E43 Unspecified severe protein-calorie malnutrition: Secondary | ICD-10-CM | POA: Diagnosis not present

## 2022-05-03 DIAGNOSIS — L89154 Pressure ulcer of sacral region, stage 4: Secondary | ICD-10-CM | POA: Diagnosis not present

## 2022-05-07 DIAGNOSIS — K838 Other specified diseases of biliary tract: Secondary | ICD-10-CM | POA: Diagnosis not present

## 2022-05-07 DIAGNOSIS — K861 Other chronic pancreatitis: Secondary | ICD-10-CM | POA: Diagnosis not present

## 2022-05-07 DIAGNOSIS — K219 Gastro-esophageal reflux disease without esophagitis: Secondary | ICD-10-CM | POA: Diagnosis not present

## 2022-05-07 NOTE — Progress Notes (Addendum)
[name] ([id number]) [id number] Page 1 of 6 Visit Report for 05/01/2022 Arrival Information Details Patient Name: Date of Service: [name]. 05/01/2022 2:00 PM Medical Record Number: [id number] Patient Account Number: [id number] Date of Birth/Sex: Treating RN: [birth year] (72 y.o. F) Primary Care Vernelle Wisner: Jayme Cloud., [name] Other Clinician: Referring Mariam Helbert: Treating Isami Mehra/Extender: Crista Curb in Treatment: 17 Visit Information History Since Last Visit Added or deleted any medications: No Patient Arrived: Ambulatory Any new allergies or adverse reactions: No Arrival Time: 13:52 Had a fall or experienced change in No Accompanied By: self activities of daily living that may affect Transfer Assistance: None risk of falls: Patient Identification Verified: Yes Signs or symptoms of abuse/neglect since last visito No Secondary Verification Process Completed: Yes Hospitalized since last visit: No Patient Requires Transmission-Based Precautions: No Implantable device outside of the clinic excluding No Patient Has Alerts: No cellular tissue based products placed in the center since last visit: Pain Present Now: No Electronic Signature(s) Signed: 05/01/2022 4:31:17 PM By: Thayer Dallas Entered By: Thayer Dallas on 05/01/2022 13:53:03 -------------------------------------------------------------------------------- Encounter Discharge Information Details Patient Name: Date of Service: Marissa Berthold Cardenas. 05/01/2022 2:00 PM Medical Record Number: [id number] Patient Account Number: [id number] Date of Birth/Sex: Treating RN: [birth year] (72 y.o. [name] Primary Care Nyree Applegate: Jayme Cloud., [name] Other Clinician: Referring Deontrey Massi: Treating Jordana Dugue/Extender: Crista Curb in Treatment: 17 Encounter Discharge Information Items Discharge Condition: Stable Ambulatory  Status: Ambulatory Discharge Destination: Home Transportation: Private Auto Accompanied By: self Schedule Follow-up Appointment: Yes Clinical Summary of Care: Patient Declined Electronic Signature(s) Signed: 05/06/2022 3:50:45 PM By: Fonnie Mu RN Entered By: Fonnie Mu on 05/01/2022 15:32:00 [name] ([id number]) [phone].pdf Page 2 of 6 -------------------------------------------------------------------------------- Lower Extremity Assessment Details Patient Name: Date of Service: [name]. 05/01/2022 2:00 PM Medical Record Number: [id number] Patient Account Number: [id number] Date of Birth/Sex: Treating RN: 11/25/1950 (72 y.o. F) Primary Care Yeshua Stryker: Jayme Cloud., [name] Other Clinician: Referring Leverne Tessler: Treating Graydon Fofana/Extender: Crista Curb in Treatment: 17 Electronic Signature(s) Signed: 05/01/2022 4:31:17 PM By: Thayer Dallas Entered By: Thayer Dallas on 05/01/2022 13:53:36 -------------------------------------------------------------------------------- Multi-Disciplinary Care Plan Details Patient Name: Date of Service: Marissa Berthold Cardenas. 05/01/2022 2:00 PM Medical Record Number: [id number] Patient Account Number: [id number] Date of Birth/Sex: Treating RN: [birth year] (72 y.o. [name] Primary Care Loredana Medellin: Jayme Cloud., [name] Other Clinician: Referring Eulogia Dismore: Treating Keyonna Comunale/Extender: Crista Curb in Treatment: 17 Active Inactive Pain, Acute or Chronic Nursing Diagnoses: Pain, acute or chronic: actual or potential Potential alteration in comfort, pain Goals: Patient will verbalize adequate pain control and receive pain control interventions during procedures as needed Date Initiated: 01/02/2022 Target Resolution Date: 04/13/2022 Goal Status: Active Patient/caregiver will verbalize adequate pain control between visits Date Initiated:  01/02/2022 Target Resolution Date: 04/13/2022 Goal Status: Active Interventions: Encourage patient to take pain medications as prescribed Provide education on pain management Reposition patient for comfort Treatment Activities: Administer pain control measures as ordered : 01/02/2022 Notes: Pressure Nursing Diagnoses: Knowledge deficit related to management of pressures ulcers Goals: Patient/caregiver will verbalize risk factors for pressure ulcer development Date Initiated: 01/02/2022 Target Resolution Date: 04/13/2022 Goal Status: Active Interventions: Assess: immobility, friction, shearing, incontinence upon admission and as needed KAMEA, ALLINSON Cardenas ([id number]) [id number].pdf Page 3 of 6 Assess offloading mechanisms upon admission and as needed Provide education on pressure ulcers Treatment Activities: T ordered outside of clinic :  01/02/2022 est Notes: Electronic Signature(s) Signed: 05/06/2022 3:50:45 PM By: Fonnie Mu RN Entered By: Fonnie Mu on 05/01/2022 14:28:08 -------------------------------------------------------------------------------- Negative Pressure Wound Therapy Application (NPWT) Details Patient Name: Date of Service: [name] 2/21/2024andnbsp2:00 PM Medical Record Number: [id number] Patient Account Number: [id number] Date of Birth/Gender: Treating RN: [birth year] (72 y.o. [name] Primary Care Physician: Jayme Cloud., [name] Other Clinician: Referring Physician: Treating Physician/Extender: Crista Curb in Treatment: 17 NPWT Application Performed for: Wound #1 Sacrum Performed By: Fonnie Mu, RN Type: VAC System Coverage Size (sq cm): 0.15 Pressure Type: Constant Pressure Setting: 125 mmHG Drain Type: None Primary Contact: Non-Adherent Quantity of Sponges/Gauze Inserted: 1 black foam Sponge/Dressing Type: Foam, Black Date Initiated: 05/01/2022 Response to  Treatment: tolerates well Electronic Signature(s) Signed: 11/21/2022 9:38:28 AM By: Fonnie Mu RN Previous Signature: 05/06/2022 3:50:45 PM Version By: Fonnie Mu RN Entered By: Fonnie Mu on 06/05/2022 14:37:53 -------------------------------------------------------------------------------- Pain Assessment Details Patient Name: Date of Service: Marissa Berthold Cardenas. 05/01/2022 2:00 PM Medical Record Number: [id number] Patient Account Number: [id number] Date of Birth/Sex: Treating RN: 11-Apr-1950 (72 y.o. F) Primary Care Mizuki Hoel: Jayme Cloud., [name] Other Clinician: Referring Euel Castile: Treating Tisha Cline/Extender: Crista Curb in Treatment: 17 Active Problems Location of Pain Severity and Description of Pain Patient Has Paino No Site Locations [place], [place] Cardenas ([id number]) [phone].pdf Page 4 of 6 Pain Management and Medication Current Pain Management: Electronic Signature(s) Signed: 05/01/2022 4:31:17 PM By: Thayer Dallas Entered By: Thayer Dallas on 05/01/2022 13:53:29 -------------------------------------------------------------------------------- Patient/Caregiver Education Details Patient Name: Date of Service: [name] 2/21/2024andnbsp2:00 PM Medical Record Number: [id number] Patient Account Number: [id number] Date of Birth/Gender: Treating RN: 1950/08/07 (72 y.o. [name] Primary Care Physician: Jayme Cloud., [name] Other Clinician: Referring Physician: Treating Physician/Extender: Crista Curb in Treatment: 17 Education Assessment Education Provided To: Patient Education Topics Provided Wound/Skin Impairment: Methods: Explain/Verbal Responses: Reinforcements needed, State content correctly [employer]) Signed: 05/06/2022 3:50:45 PM By: Fonnie Mu RN Entered By: Fonnie Mu on 05/01/2022  14:41:54 -------------------------------------------------------------------------------- Wound Assessment Details Patient Name: Date of Service: Marissa Berthold Cardenas. 05/01/2022 2:00 PM Medical Record Number: [id number] Patient Account Number: [id number] Date of Birth/Sex: Treating RN: 02-08-1951 (72 y.o. F) Primary Care Shineka Auble: Jayme Cloud., [name] Other Clinician: Referring Ramiyah Mcclenahan: Treating Mayana Irigoyen/Extender: Marcelino Scot [place], Bryce Cardenas ([id number]) [phone].pdf Page 5 of 6 Weeks in Treatment: 17 Wound Status Wound Number: 1 Primary Etiology: Pressure Ulcer Wound Location: Sacrum Wound Status: Open Wounding Event: Pressure Injury Comorbid History: Colitis Date Acquired: 10/09/2021 Weeks Of Treatment: 17 Clustered Wound: No Photos Wound Measurements Length: (cm) 0.5 Width: (cm) 0.3 Depth: (cm) 1 Area: (cm) 0.118 Volume: (cm) 0.118 % Reduction in Area: 66.6% % Reduction in Volume: 52.2% Epithelialization: None Tunneling: No Undermining: Yes Starting Position (o'clock): 6 Ending Position (o'clock): 7 Maximum Distance: (cm) 1.3 Wound Description Classification: Category/Stage IV Exudate Amount: Medium Exudate Type: Purulent Exudate Color: yellow, brown, green Wound Bed Granulation Amount: Large (67-100%) Granulation Quality: Pink Necrotic Amount: None Present (0%) Periwound Skin Texture Texture Color No Abnormalities Noted: No No Abnormalities Noted: No Moisture No Abnormalities Noted: No Maceration: Yes Electronic Signature(s) Signed: 05/01/2022 4:31:17 PM By: Thayer Dallas Entered By: Thayer Dallas on 05/01/2022 13:58:49 -------------------------------------------------------------------------------- Vitals Details Patient Name: Date of Service: Marissa Berthold Cardenas. 05/01/2022 2:00 PM Medical Record Number: [id number] Patient Account Number: [id number] Date of Birth/Sex: Treating RN: Jan 01, 1951 (71 y.o.  F) Primary Care Verline Kong: Jayme Cloud., [name] Other Clinician: Referring Torrin Crihfield: Treating Robbie Rideaux/Extender: Crista Curb in Treatment: [address], [place] Cardenas ([id number]) [phone].pdf Page 6 of 6 Vital Signs Time Taken: 13:53 Temperature (F): 97.5 Height (in): 64 Pulse (bpm): 66 Weight (lbs): 97 Respiratory Rate (breaths/min): 18 Body Mass Index (BMI): 16.6 Blood Pressure (mmHg): 101/53 Reference Range: 80 - 120 mg / dl Electronic Signature(s) Signed: 05/01/2022 4:31:17 PM By: Thayer Dallas Entered By: Thayer Dallas on 05/01/2022 13:53:23

## 2022-05-08 ENCOUNTER — Encounter (HOSPITAL_BASED_OUTPATIENT_CLINIC_OR_DEPARTMENT_OTHER): Payer: 59 | Admitting: Physician Assistant

## 2022-05-08 DIAGNOSIS — E43 Unspecified severe protein-calorie malnutrition: Secondary | ICD-10-CM | POA: Diagnosis not present

## 2022-05-08 DIAGNOSIS — L89153 Pressure ulcer of sacral region, stage 3: Secondary | ICD-10-CM | POA: Diagnosis not present

## 2022-05-08 DIAGNOSIS — M6281 Muscle weakness (generalized): Secondary | ICD-10-CM | POA: Diagnosis not present

## 2022-05-08 DIAGNOSIS — L89154 Pressure ulcer of sacral region, stage 4: Secondary | ICD-10-CM | POA: Diagnosis not present

## 2022-05-08 NOTE — Progress Notes (Signed)
LANDREA, VUOLO R ([id number]) [id number] Page 1 of 1 Visit Report for 05/03/2022 SuperBill Details Patient Name: Date of Service: [name] 05/03/2022 Medical Record Number: [id number] Patient Account Number: [id number] Date of Birth/Sex: Treating RN: April 25, 1950 (72 y.o. [name] Primary Care Provider: [name], [name] Other Clinician: Referring Provider: Treating Provider/Extender: Genice Rouge., [name] in Treatment: 17 Diagnosis Coding ICD-10 Codes Code Description [phone] Pressure ulcer of sacral region, stage 4 M62.81 Muscle weakness (generalized) E43 Unspecified severe protein-calorie malnutrition Facility Procedures CPT4 Code Description Modifier Quantity [id number] [phone] NEG PRESS WND [place] <=50 SQ CM 1 Electronic Signature(s) Signed: 05/03/2022 12:04:24 PM By: Kalman Shan DO Signed: 05/07/2022 4:22:01 PM By: Antoine Primas Entered By: Antoine Primas on 05/03/2022 10:12:13

## 2022-05-08 NOTE — Progress Notes (Signed)
[name] ([id number]) [id number] Page 1 of 2 Visit Report for 05/03/2022 Arrival Information Details Patient Name: Date of Service: [name] 05/03/2022 7:45 A M Medical Record Number: [id number] Patient Account Number: [id number] Date of Birth/Sex: Treating Cardenas: Aug 20, 1950 (72 y.o. [name] Primary Care Cerinity Zynda: Marissa Mac., [name] Other Clinician: Referring [name]: Treating [name]/Extender: Marissa Rouge., [name] in Treatment: 17 Visit Information History Since Last Visit All ordered tests and consults were completed: Yes Patient Arrived: Ambulatory Added or deleted any medications: No Arrival Time: 07:54 Any new allergies or adverse reactions: No Accompanied By: self Had a fall or experienced change in No Transfer Assistance: None activities of daily living that may affect Patient Identification Verified: Yes risk of falls: Secondary Verification Process Completed: Yes Signs or symptoms of abuse/neglect since last visito No Patient Requires Transmission-Based Precautions: No Hospitalized since last visit: No Patient Has Alerts: No Implantable device outside of the clinic excluding No cellular tissue based products placed in the center since last visit: Pain Present Now: No Electronic Signature(s) Signed: 05/07/2022 4:22:01 PM By: Antoine Primas Entered By: Antoine Primas on 05/03/2022 10:07:55 -------------------------------------------------------------------------------- Encounter Discharge Information Details Patient Name: Date of Service: Marissa Perking Cardenas. 05/03/2022 7:45 A M Medical Record Number: [id number] Patient Account Number: [id number] Date of Birth/Sex: Treating Cardenas: 03/10/1951 (72 y.o. [name] Primary Care Lisett Dirusso: Marissa Mac., [name] Other Clinician: Referring Angelea Penny: Treating Taitum Menton/Extender: Marissa Rouge., [name] in Treatment: 17 Encounter  Discharge Information Items Discharge Condition: Stable Ambulatory Status: Ambulatory Discharge Destination: Home Transportation: Private Auto Accompanied By: self Schedule Follow-up Appointment: Yes Clinical Summary of Care: Electronic Signature(s) Signed: 05/07/2022 4:22:01 PM By: Antoine Primas Entered By: Antoine Primas on 05/03/2022 10:11:37 -------------------------------------------------------------------------------- Negative Pressure Wound Therapy Maintenance (NPWT) Details Patient Name: Date of Service: [name] 05/03/2022 7:45 A M Medical Record Number: [id number] Patient Account Number: [id number] Date of Birth/Sex: Treating Cardenas: [birth year] (72 y.o. [name] Primary Care Missi Mcmackin: Marissa Mac., [name] Other Clinician: Referring Sherod Cisse: Treating Jakylah Bassinger/Extender: Marissa Rouge., [name] in Treatment: 17 NPWT Maintenance Performed for: Wound #1 Sacrum Performed By: [name] Type: VAC System Coverage Size (sq cm): 0.15 [name] ([id number]) [phone].pdf Page 2 of 2 Pressure Type: Constant Pressure Setting: 125 mmHG Drain Type: None Primary Contact: Non-Adherent Sponge/Dressing Type: Foam, Blue Date Initiated: 05/01/2022 Dressing Removed: Yes Quantity of Sponges/Gauze Removed: x1 Canister Changed: Yes Canister Exudate Volume: 0 Dressing Reapplied: Yes Quantity of Sponges/Gauze Inserted: x1 bridge to left hip Respones T Treatment: o tolerated well Days On NPWT : 3 Electronic Signature(s) Signed: 05/07/2022 4:22:01 PM By: Antoine Primas Entered By: Antoine Primas on 05/03/2022 10:10:34 -------------------------------------------------------------------------------- Wound Assessment Details Patient Name: Date of Service: Marissa [name]. 05/03/2022 7:45 A M Medical Record Number: [id number] Patient Account Number: [id number] Date of Birth/Sex: Treating Cardenas: 05/19/1950 (72 y.o. [name] Primary Care Terius Jacuinde: Marissa Mac., [name] Other Clinician: Referring Zuleika Gallus: Treating Takeela Peil/Extender: Marissa Rouge., [name] in Treatment: 17 Wound Status Wound Number: 1 Primary Etiology: Pressure Ulcer Wound Location: Sacrum Wound Status: Open Wounding Event: Pressure Injury Date Acquired: 10/09/2021 Weeks Of Treatment: 17 Clustered Wound: No Wound Measurements Length: (cm) 0.5 Width: (cm) 0.3 Depth: (cm) 1 Area: (cm) 0.118 Volume: (cm) 0.118 % Reduction in Area: 66.6% % Reduction in Volume: 52.2% Wound Description Classification: Category/Stage IV Exudate Amount: Medium Exudate Type: Purulent Exudate Color: yellow, brown, green Periwound Skin Texture  Texture Color No Abnormalities Noted: No No Abnormalities Noted: No Moisture No Abnormalities Noted: No Electronic Signature(s) Signed: 05/07/2022 4:22:01 PM By: Antoine Primas Entered By: Antoine Primas on 05/03/2022 07:59:23

## 2022-05-09 NOTE — Progress Notes (Addendum)
ZAIDEN, SIMAN R ([id number]) [id number] Page 1 of 7 Visit Report for 05/08/2022 Chief Complaint Document Details Patient Name: Date of Service: [name] 05/08/2022 8:45 A M Medical Record Number: [id number] Patient Account Number: [id number] Date of Birth/Sex: Treating RN: 24-Dec-1950 (72 y.o. F) Primary Care Provider: [name], [name] Other Clinician: Referring Provider: Treating Provider/Extender: [name] in Treatment: 18 Information Obtained from: Patient Chief Complaint Pressure ulcer stage 3 Sacrum Electronic Signature(s) Signed: 05/08/2022 9:18:39 AM By: Worthy Keeler PA-C Entered By: Worthy Keeler on 05/08/2022 09:18:39 -------------------------------------------------------------------------------- HPI Details Patient Name: Date of Service: [name] 05/08/2022 8:45 A M Medical Record Number: [id number] Patient Account Number: [id number] Date of Birth/Sex: Treating RN: [birth year] (72 y.o. F) Primary Care Provider: [name], [name] Other Clinician: Referring Provider: Treating Provider/Extender: [name] in Treatment: 18 History of Present Illness HPI Description: 01-02-22 upon evaluation today patient presents for initial inspection here in our clinic concerning a wound that she has over the sacral region. This is stated to be present since around the beginning of August 2023. She currently resides in a assisted nursing facility. She does seem to be able to answer questions fully today. Subsequently I do note that she is a little on the thin side but again other than the protein calorie malnutrition she is minimally weak but still does get up and move around some but she also tells me that she "sits a lot". She has not had any x-rays of the sacral region at this point. 01-09-2022 upon evaluation today patient's wound in the sacral area actually appears to be  doing decently well. Fortunately I do not see any signs of infection which is great news and overall I am extremely pleased with where things stand today. 11/8; this is a patient who lives in some form of small assisted living in [place]. She has a deep wound over the lower part of her coccyx. An x-ray that we ordered from last time did not show any osseous abnormalities. We are supposed to be using Hydrofera Blue in the wound but I am really not sure what they are using to dress this and who is doing it. Talking to our staff they have apparently discussed this with the staff in the facility. She does not have home health. 01-23-2022 upon evaluation today patient appears to be doing decently well in regard to her wound. She has been tolerating the dressing changes without complication although I am not certain the dressing changes have been done appropriately over the past couple of weeks. We have been trying to get her to come in here we also try to get her into a wound care center in [place] which would be closer for the assisted living facility. Unfortunately neither 1 of those were undertaken up to this point. The patient did end up having some training of the staff from an RN that they brought him to have the staff perform the dressing changes but that being said it does not sound like this has been done correctly. Nonetheless I do believe that based on what we see it would benefit the patient to actually have her come here 3 times a week also think a wound VAC could potentially be a possibility for her which would help to get things moving in a much better direction as far as healing is concerned. We need to get this to fill-in though it looks  clean and does not look infected I do think that we need to really get this to fill-in and there is quite a bit of undermining that is to be considered here. 01-30-2022 upon evaluation today patient appears to be doing well currently in regard to her wound  which is looking pretty decent but still has quite a bit of space underneath as far as undermining is concerned. I think she might possibly be better with a wound VAC. With that being said if when I do this we probably only be able to do it 2 times a week at most. I discussed that with the patient today she is okay with that we just need to see if we can get the insurance approval and then of course the scheduling underway. 02-13-2022 upon evaluation today patient appears to be doing well currently in regard to her wound. She actually tells me that it is feeling a lot better which is good news. Fortunately I do not see any signs of active infection at this time. No fevers, chills, nausea, vomiting, or diarrhea. 02-27-2022 upon evaluation today patient appears to be doing well currently in regard to her wound. She is actually showing some signs of improvement we are still obtaining the approval for the snap VAC which I think could be beneficial in the meantime we been using the Hydrofera Blue rope. 03-27-2022 upon evaluation today patient appears to be doing okay in regard to her wound but there was no dressing in place upon arrival today. Fortunately I see again no evidence of infection. No fevers, chills, nausea, vomiting, or diarrhea. 04-03-2022 upon evaluation today patient did not have a dressing on when she came into the office. With that being said she actually tells me that it was on until yesterday she took a shower and it came loose and then today came off completely. With that being said I really do not think she needs to be taking shower every single day or having to see her here in the clinic 3 times per week on Monday, Wednesday, and Friday and during those times obviously in between she needs to probably avoid showering. For that reason what I advised her to do would be to shower in the mornings on the days that she comes to see [place] that way if the dressing does come off or start to come off  we will be changing it anyway. She voiced understanding and tells me she can do that. Unfortunately the facility has nobody to help her with changing the dressings and we are not currently having to do that here in the clinic again. This means SHEIKA, ROAN R ([id number]) [id number] Page 2 of 7 that during the time that she was not coming in from December 20 through when I saw her last week on the 17th there was a period of 2 weeks where the wound really was not changed at all as the RN that Myriam Jacobson tell me they had at the facility did not end up working out. This obviously is not good and is not what we expect to see as far as patient care is concerned which is why we are now seeing her 3 times a week here in the clinic. 04-10-2022 upon evaluation today patient appears to be doing well currently in regard to her wound which is actually showing signs of good granulation epithelization at this point. However she does have quite a bit of drainage and we are little concerned about the possibility of  infection. For that reason I think she could benefit from a PCR culture followed by [facility] topical antibiotics. I am also thinking of going ahead and doing gentamicin today as well. 2/7; small wound on the lower sacrum however with considerable degree of undermining from roughly 7-4. PCR culture that was done last week showed "no organisms". We have been using gentamicin and iodoform packing.She lives in some form of group home in [place] I believe. Uncertain about the adequacy of offloading this area 04-24-2022 upon evaluation patient's wound actually is showing signs of doing decently well I do believe she would benefit from a snap VAC and we discussed that again today. Fortunately I do not see any signs of active infection locally nor systemically which is great news. No fevers, chills, nausea, vomiting, or diarrhea. 05-01-2022 upon evaluation today patient's wound is really  doing about the same. Fortunately I do not see any signs of active infection locally nor systemically at this time which is great news with that being said we have gotten approval for the snap VAC and we will going to go ahead and place that today. 05-08-2022 upon evaluation today patient appears to be doing well currently in regard to her wound all things considered but were having a difficult time with a snap VAC suctioning appropriately. With that being said we will get a go ahead and likely avoid the snap VAC at this point at least until we can get the actual brace dressing as bridging ourselves does not seem to be working nearly as well. Electronic Signature(s) Signed: 05/08/2022 9:56:52 AM By: Worthy Keeler PA-C Entered By: Worthy Keeler on 05/08/2022 09:56:52 -------------------------------------------------------------------------------- Physical Exam Details Patient Name: Date of Service: [name] 05/08/2022 8:45 A M Medical Record Number: [id number] Patient Account Number: [id number] Date of Birth/Sex: Treating RN: April 29, 1950 (72 y.o. F) Primary Care Provider: [name], [name] Other Clinician: Referring Provider: Treating Provider/Extender: [name] in Treatment: 45 Constitutional Well-nourished and well-hydrated in no acute distress. Respiratory normal breathing without difficulty. Psychiatric this patient is able to make decisions and demonstrates good insight into disease process. Alert and Oriented x 3. pleasant and cooperative. Notes Upon inspection patient's wound bed actually looks okay. She still has quite a bit of undermining unfortunately. With that being said I do believe that a couple options include possibly using a smaller Medela VAC versus getting the snap VAC bridge dressing versus sending the patient for a plastic surgery consult at [facility]. And then I think that we may want to make the referral to [facility] as well as  see about the Medela VAC if at all possible. Electronic Signature(s) Signed: 05/08/2022 9:57:32 AM By: Worthy Keeler PA-C Entered By: Worthy Keeler on 05/08/2022 09:57:32 -------------------------------------------------------------------------------- Physician Orders Details Patient Name: Date of Service: [name] 05/08/2022 8:45 A M Medical Record Number: [id number] Patient Account Number: [id number] Date of Birth/Sex: Treating RN: Jun 04, 1950 (72 y.o. [name], [name] Primary Care Provider: [name], [name] Other Clinician: Referring Provider: Treating Provider/Extender: [name] in Treatment: [phone] Verbal / Phone Orders: No Diagnosis Coding Follow-up Appointments ppointment in 1 week. - w/ Jeri Cos, PA and Lauran Rm # 9 Wednesday 05/15/22 @ 10:15 Return A ppointment in 2 weeks. - w/ Dr. Dellia Nims and Allayne Butcher Rm # 4 Wednesday 05/22/22 @ 10:15 Return A Nurse Visit: - Friday 05/10/22 @ 9:45 RM # 7 Monday 05/13/22 @ 1:30 Rm #  7 and Friday 05/17/22 @ 7:45 Other: - Administrator [facility]) : [phone] LORENA, BROSKEY ([id number]) [id number] Page 3 of 7 PCR culture negative-no [facility] ordered. Anesthetic (In clinic) Topical Lidocaine 5% applied to wound bed Negative Presssure Wound Therapy Black and White Foam combination - medela wound vac- order through wound Q Off-Loading Turn and reposition every 2 hours Wound Treatment Wound #1 - Sacrum Cleanser: Wound Cleanser (Home Health) 2 x Per Week/30 Days Discharge Instructions: Cleanse the wound with wound cleanser prior to applying a clean dressing using gauze sponges, not tissue or cotton balls. Peri-Wound Care: Skin Prep (Home Health) 2 x Per Week/30 Days Discharge Instructions: Use skin prep as directed Prim Dressing: Hydrofera Blue Classic Foam, 4x4 in 2 x Per Week/30 Days ary Discharge Instructions: Moisten with saline prior to applying to wound  bed Secondary Dressing: Zetuvit Plus Silicone Border Dressing 4x4 (in/in) 2 x Per Week/30 Days Discharge Instructions: Apply silicone border over primary dressing as directed. Consults Plastic Surgery - Refer to [facility] Wound Care/Plastic Surgery for possible surgical closure/intervention Electronic Signature(s) Signed: 05/08/2022 3:56:54 PM By: Worthy Keeler PA-C Signed: 05/08/2022 3:59:14 PM By: [name] Entered By: [name] on 05/08/2022 09:38:28 Prescription 05/08/2022 -------------------------------------------------------------------------------- Burnett Harry PA Patient Name: Provider: September 11, 1950 [id number] Date of Birth: NPI#Wanda Plump [id number] Sex: DEA #: [phone] Phone #: License #: [place] Patient Address: [address], [place] [address] [address], [place] [address] [phone] Allergies No Known Allergies Provider's Orders Plastic Surgery - Refer to [facility] Wound Care/Plastic Surgery for possible surgical closure/intervention Hand Signature: Date(s): Electronic Signature(s) Signed: 05/08/2022 3:56:54 PM By: Worthy Keeler PA-C Signed: 05/08/2022 3:59:14 PM By: [name] Entered By: [name] on 05/08/2022 09:38:28 -------------------------------------------------------------------------------- Problem List Details Patient Name: Date of Service: [name] 05/08/2022 8:45 A M Medical Record Number: [id number] Patient Account Number: [id number] Date of Birth/Sex: Treating RN: [birth year] (72 y.o. TAMRYN, SCULL R ([id number]) [id number] Page 4 of 7 Primary Care Provider: [name], [name] Other Clinician: Referring Provider: Treating Provider/Extender: [name] in Treatment: 18 Active Problems ICD-10 Encounter Code Description  Active Date MDM Diagnosis L89.154 Pressure ulcer of sacral region, stage 4 01/02/2022 No Yes M62.81 Muscle weakness (generalized) 01/02/2022 No Yes E43 Unspecified severe protein-calorie malnutrition 01/02/2022 No Yes Inactive Problems Resolved Problems Electronic Signature(s) Signed: 05/08/2022 9:18:24 AM By: Worthy Keeler PA-C Entered By: Worthy Keeler on 05/08/2022 09:18:24 -------------------------------------------------------------------------------- Progress Note Details Patient Name: Date of Service: [name] 05/08/2022 8:45 A M Medical Record Number: [id number] Patient Account Number: [id number] Date of Birth/Sex: Treating RN: [birth year] (72 y.o. F) Primary Care Provider: [name], [name] Other Clinician: Referring Provider: Treating Provider/Extender: [name] in Treatment: 18 Subjective Chief Complaint Information obtained from Patient Pressure ulcer stage 3 Sacrum History of Present Illness (HPI) 01-02-22 upon evaluation today patient presents for initial inspection here in our clinic concerning a wound that she has over the sacral region. This is stated to be present since around the beginning of August 2023. She currently resides in a assisted nursing facility. She does seem to be able to answer questions fully today. Subsequently I do note that she is a little on the thin side but again other than the protein calorie malnutrition she is minimally weak but still does get up  and move around some but she also tells me that she "sits a lot". She has not had any x-rays of the sacral region at this point. 01-09-2022 upon evaluation today patient's wound in the sacral area actually appears to be doing decently well. Fortunately I do not see any signs of infection which is great news and overall I am extremely pleased with where things stand today. 11/8; this is a patient who lives in some form of small assisted living in  [place]. She has a deep wound over the lower part of her coccyx. An x-ray that we ordered from last time did not show any osseous abnormalities. We are supposed to be using Hydrofera Blue in the wound but I am really not sure what they are using to dress this and who is doing it. Talking to our staff they have apparently discussed this with the staff in the facility. She does not have home health. 01-23-2022 upon evaluation today patient appears to be doing decently well in regard to her wound. She has been tolerating the dressing changes without complication although I am not certain the dressing changes have been done appropriately over the past couple of weeks. We have been trying to get her to come in here we also try to get her into a wound care center in [place] which would be closer for the assisted living facility. Unfortunately neither 1 of those were undertaken up to this point. The patient did end up having some training of the staff from an RN that they brought him to have the staff perform the dressing changes but that being said it does not sound like this has been done correctly. Nonetheless I do believe that based on what we see it would benefit the patient to actually have her come here 3 times a week also think a wound VAC could potentially be a possibility for her which would help to get things moving in a much better direction as far as healing is concerned. We need to get this to fill-in though it looks clean and does not look infected I do think that we need to really get this to fill-in and there is quite a bit of undermining that is to be considered here. 01-30-2022 upon evaluation today patient appears to be doing well currently in regard to her wound which is looking pretty decent but still has quite a bit of space underneath as far as undermining is concerned. I think she might possibly be better with a wound VAC. With that being said if when I do this we probably only be  able to do it 2 times a week at most. I discussed that with the patient today she is okay with that we just need to see if we can get the insurance approval and then of course the scheduling underway. 02-13-2022 upon evaluation today patient appears to be doing well currently in regard to her wound. She actually tells me that it is feeling a lot better which is good news. Fortunately I do not see any signs of active infection at this time. No fevers, chills, nausea, vomiting, or diarrhea. NITYA, MOLYNEAUX R ([id number]) [id number] Page 5 of 7 02-27-2022 upon evaluation today patient appears to be doing well currently in regard to her wound. She is actually showing some signs of improvement we are still obtaining the approval for the snap VAC which I think could be beneficial in the meantime we been using the Hydrofera Blue rope. 03-27-2022 upon evaluation  today patient appears to be doing okay in regard to her wound but there was no dressing in place upon arrival today. Fortunately I see again no evidence of infection. No fevers, chills, nausea, vomiting, or diarrhea. 04-03-2022 upon evaluation today patient did not have a dressing on when she came into the office. With that being said she actually tells me that it was on until yesterday she took a shower and it came loose and then today came off completely. With that being said I really do not think she needs to be taking shower every single day or having to see her here in the clinic 3 times per week on Monday, Wednesday, and Friday and during those times obviously in between she needs to probably avoid showering. For that reason what I advised her to do would be to shower in the mornings on the days that she comes to see [place] that way if the dressing does come off or start to come off we will be changing it anyway. She voiced understanding and tells me she can do that. Unfortunately the facility has nobody to help her with  changing the dressings and we are not currently having to do that here in the clinic again. This means that during the time that she was not coming in from December 20 through when I saw her last week on the 17th there was a period of 2 weeks where the wound really was not changed at all as the RN that Myriam Jacobson tell me they had at the facility did not end up working out. This obviously is not good and is not what we expect to see as far as patient care is concerned which is why we are now seeing her 3 times a week here in the clinic. 04-10-2022 upon evaluation today patient appears to be doing well currently in regard to her wound which is actually showing signs of good granulation epithelization at this point. However she does have quite a bit of drainage and we are little concerned about the possibility of infection. For that reason I think she could benefit from a PCR culture followed by [facility] topical antibiotics. I am also thinking of going ahead and doing gentamicin today as well. 2/7; small wound on the lower sacrum however with considerable degree of undermining from roughly 7-4. PCR culture that was done last week showed "no organisms". We have been using gentamicin and iodoform packing.She lives in some form of group home in [place] I believe. Uncertain about the adequacy of offloading this area 04-24-2022 upon evaluation patient's wound actually is showing signs of doing decently well I do believe she would benefit from a snap VAC and we discussed that again today. Fortunately I do not see any signs of active infection locally nor systemically which is great news. No fevers, chills, nausea, vomiting, or diarrhea. 05-01-2022 upon evaluation today patient's wound is really doing about the same. Fortunately I do not see any signs of active infection locally nor systemically at this time which is great news with that being said we have gotten approval for the snap VAC and we will going to go  ahead and place that today. 05-08-2022 upon evaluation today patient appears to be doing well currently in regard to her wound all things considered but were having a difficult time with a snap VAC suctioning appropriately. With that being said we will get a go ahead and likely avoid the snap VAC at this point at least until we  can get the actual brace dressing as bridging ourselves does not seem to be working nearly as well. Objective Constitutional Well-nourished and well-hydrated in no acute distress. Vitals Time Taken: 8:53 AM, Height: 64 in, Weight: 97 lbs, BMI: 16.6, Temperature: 98.7 F, Pulse: 65 bpm, Respiratory Rate: 18 breaths/min, Blood Pressure: 98/57 mmHg. Respiratory normal breathing without difficulty. Psychiatric this patient is able to make decisions and demonstrates good insight into disease process. Alert and Oriented x 3. pleasant and cooperative. General Notes: Upon inspection patient's wound bed actually looks okay. She still has quite a bit of undermining unfortunately. With that being said I do believe that a couple options include possibly using a smaller Medela VAC versus getting the snap VAC bridge dressing versus sending the patient for a plastic surgery consult at [facility]. And then I think that we may want to make the referral to [facility] as well as see about the Medela VAC if at all possible. Integumentary (Hair, Skin) Wound #1 status is Open. Original cause of wound was Pressure Injury. The date acquired was: 10/09/2021. The wound has been in treatment 18 weeks. The wound is located on the Sacrum. The wound measures 0.7cm length x 0.4cm width x 1cm depth; 0.22cm^2 area and 0.22cm^3 volume. There is no tunneling noted, however, there is undermining starting at 8:00 and ending at 12:00 with a maximum distance of 1.5cm. There is a medium amount of purulent drainage noted. Foul odor after cleansing was noted. There is large (67-100%) pink granulation within the wound bed.  There is a small (1-33%) amount of necrotic tissue within the wound bed including Adherent Slough. The periwound skin appearance had no abnormalities noted for texture. The periwound skin appearance had no abnormalities noted for moisture. The periwound skin appearance exhibited: Erythema. The surrounding wound skin color is noted with erythema which is circumferential. Periwound temperature was noted as No Abnormality. Assessment Active Problems ICD-10 Pressure ulcer of sacral region, stage 4 Muscle weakness (generalized) Unspecified severe protein-calorie malnutrition TARALYN, MARMION R ([id number]) [phone] Page 6 of 7 Plan Follow-up Appointments: Return Appointment in 1 week. - w/ Jeri Cos, PA and Lauran Rm # 9 Wednesday 05/15/22 @ 10:15 Return Appointment in 2 weeks. - w/ Dr. Dellia Nims and Allayne Butcher Rm # 4 Wednesday 05/22/22 @ 10:15 Nurse Visit: - Friday 05/10/22 @ 9:45 RM # 7 Monday 05/13/22 @ 1:30 Rm # 7 and Friday 05/17/22 @ 7:45 Other: - Administrator Arbutus Ped) : [phone] PCR culture negative-no [employer] ordered. Anesthetic: (In clinic) Topical Lidocaine 5% applied to wound bed Negative Presssure Wound Therapy: Black and White Foam combination - medela wound vac- order through wound Q Off-Loading: Turn and reposition every 2 hours Consults ordered were: Plastic Surgery - Refer to [facility] for possible surgical closure/intervention WOUND #1: - Sacrum Wound Laterality: Cleanser: Wound Cleanser ([place]) 2 x Per Week/30 Days Discharge Instructions: Cleanse the wound with wound cleanser prior to applying a clean dressing using gauze sponges, not tissue or cotton balls. Peri-Wound Care: Skin Prep (Home Health) 2 x Per Week/30 Days Discharge Instructions: Use skin prep as directed Prim Dressing: Hydrofera Blue Classic Foam, 4x4 in 2 x Per Week/30 Days ary Discharge Instructions: Moisten with saline prior to applying to wound  bed Secondary Dressing: Zetuvit Plus Silicone Border Dressing 4x4 (in/in) 2 x Per Week/30 Days Discharge Instructions: Apply silicone border over primary dressing as directed. 1. I am recommend currently that we have the patient continue to monitor for any signs of infection or worsening we  did go ahead and obtain a wound culture today just because there was some odor and smell although this may have been just due to the wound VAC not suctioning appropriately. 2. We can actually go ahead and continue with the [facility] that we use classic at this point. 3. I am also going to recommend that we have the patient continue with the Zetuvit bordered foam dressing to cover changed 3 times per week at this point. 4. Also to see about making a referral to plastic surgery. This will be at [facility]. I think there is a chance that surgical closure could benefit this patient and her wound that we are really not seeing much progress with. I am afraid that if it continues to be open without primary closure that this may risk infection which could be a much bigger issue for her. I think she is actually a pretty good candidate in my opinion for surgical closure if that is deemed necessary or appropriate. We will see patient back for reevaluation in 1 week here in the clinic. If anything worsens or changes patient will contact our office for additional recommendations. Electronic Signature(s) Signed: 05/08/2022 9:58:40 AM By: Worthy Keeler PA-C Entered By: Worthy Keeler on 05/08/2022 09:58:40 -------------------------------------------------------------------------------- SuperBill Details Patient Name: Date of Service: [place] [name] 05/08/2022 Medical Record Number: [id number] Patient Account Number: [id number] Date of Birth/Sex: Treating RN: [birth year] (72 y.o. [name], [name] Primary Care Provider: [name], [name] Other Clinician: Referring Provider: Treating Provider/Extender: [name] in Treatment: 18 Diagnosis Coding ICD-10 Codes Code Description [phone] Pressure ulcer of sacral region, stage 4 M62.81 Muscle weakness (generalized) E43 Unspecified severe protein-calorie malnutrition Facility Procedures : CPT4 Code: [id number] Description: [id number] - WOUND CARE VISIT-LEV 3 EST PT Modifier: Quantity: 1 Physician Procedures : CPT4 Code Description Modifier [id number] - WC PHYS LEVEL 4 - EST PT ICD-10 Diagnosis Description L89.154 Pressure ulcer of sacral region, stage 4 M62.81 Muscle weakness (generalized) E43 Unspecified severe protein-calorie malnutrition KIERSTEN, BLAESER R ([id number]) [phone].pdf Page 7 of Quantity: 1 7 Electronic Signature(s) Signed: 05/08/2022 9:58:53 AM By: Worthy Keeler PA-C Entered By: Worthy Keeler on 05/08/2022 09:58:52

## 2022-05-09 NOTE — Progress Notes (Signed)
NATAIYA, CZARNOWSKI Cardenas ([id number]) [id number] Page 1 of 7 Visit Report for 05/08/2022 Arrival Information Details Patient Name: Date of Service: [name] 05/08/2022 8:45 A M Medical Record Number: [id number] Patient Account Number: [id number] Date of Birth/Sex: Treating RN: April 07, 1950 (72 y.o. F) Primary Care Novaleigh Kohlman: [name], [name] Other Clinician: Referring Kharma Sampsel: Treating Yael Coppess/Extender: [name] in Treatment: 18 Visit Information History Since Last Visit Added or deleted any medications: No Patient Arrived: Ambulatory Any new allergies or adverse reactions: No Arrival Time: 08:52 Had a fall or experienced change in No Accompanied By: self activities of daily living that may affect Transfer Assistance: None risk of falls: Patient Identification Verified: Yes Signs or symptoms of abuse/neglect since last visito No Secondary Verification Process Completed: Yes Hospitalized since last visit: No Patient Requires Transmission-Based Precautions: No Implantable device outside of the clinic excluding No Patient Has Alerts: No cellular tissue based products placed in the center since last visit: Has Dressing in Place as Prescribed: No Pain Present Now: No Electronic Signature(s) Signed: 05/08/2022 3:49:00 PM By: Erenest Blank Entered By: Erenest Blank on 05/08/2022 09:10:56 -------------------------------------------------------------------------------- Clinic Level of Care Assessment Details Patient Name: Date of Service: [name] 05/08/2022 8:45 A M Medical Record Number: [id number] Patient Account Number: [id number] Date of Birth/Sex: Treating RN: 12/29/1950 (72 y.o. [name], [name] Primary Care Abrian Hanover: [name], [name] Other Clinician: Referring Sandi Towe: Treating Teodoro Jeffreys/Extender: [name] in Treatment: 18 Clinic Level of Care Assessment  Items TOOL 4 Quantity Score X- 1 0 Use when only an EandM is performed on FOLLOW-UP visit ASSESSMENTS - Nursing Assessment / Reassessment X- 1 10 Reassessment of Marissa-morbidities (includes updates in patient status) X- 1 5 Reassessment of Adherence to Treatment Plan ASSESSMENTS - Wound and Skin A ssessment / Reassessment X - Simple Wound Assessment / Reassessment - one wound 1 5 '[]'$  - 0 Complex Wound Assessment / Reassessment - multiple wounds '[]'$  - 0 Dermatologic / Skin Assessment (not related to wound area) ASSESSMENTS - Focused Assessment '[]'$  - 0 Circumferential Edema Measurements - multi extremities '[]'$  - 0 Nutritional Assessment / Counseling / Intervention '[]'$  - 0 Lower Extremity Assessment (monofilament, tuning fork, pulses) '[]'$  - 0 Peripheral Arterial Disease Assessment (using hand held doppler) ASSESSMENTS - Ostomy and/or Continence Assessment and Care '[]'$  - 0 Incontinence Assessment and Management '[]'$  - 0 Ostomy Care Assessment and Management (repouching, etc.) PROCESS - Coordination of Care X - Simple Patient / Family Education for ongoing care 1 [address], [place] Cardenas ([id number]) [phone].pdf Page 2 of 7 '[]'$  - 0 Complex (extensive) Patient / Family Education for ongoing care X- 1 10 Staff obtains Programmer, systems, Records, T Results / Process Orders est '[]'$  - 0 Staff telephones HHA, Nursing Homes / Clarify orders / etc '[]'$  - 0 Routine Transfer to another Facility (non-emergent condition) '[]'$  - 0 Routine Hospital Admission (non-emergent condition) '[]'$  - 0 New Admissions / Biomedical engineer / Ordering NPWT Apligraf, etc. , '[]'$  - 0 Emergency Hospital Admission (emergent condition) X- 1 10 Simple Discharge Coordination '[]'$  - 0 Complex (extensive) Discharge Coordination PROCESS - Special Needs '[]'$  - 0 Pediatric / Minor Patient Management '[]'$  - 0 Isolation Patient Management '[]'$  - 0 Hearing / Language / Visual special needs '[]'$  - 0 Assessment of  Community assistance (transportation, D/C planning, etc.) '[]'$  - 0 Additional assistance / Altered mentation '[]'$  - 0 Support Surface(s) Assessment (bed, cushion, seat, etc.) INTERVENTIONS - Wound Cleansing /  Measurement X - Simple Wound Cleansing - one wound 1 5 '[]'$  - 0 Complex Wound Cleansing - multiple wounds X- 1 5 Wound Imaging (photographs - any number of wounds) '[]'$  - 0 Wound Tracing (instead of photographs) X- 1 5 Simple Wound Measurement - one wound '[]'$  - 0 Complex Wound Measurement - multiple wounds INTERVENTIONS - Wound Dressings X - Small Wound Dressing one or multiple wounds 1 10 '[]'$  - 0 Medium Wound Dressing one or multiple wounds '[]'$  - 0 Large Wound Dressing one or multiple wounds X- 1 5 Application of Medications - topical '[]'$  - 0 Application of Medications - injection INTERVENTIONS - Miscellaneous '[]'$  - 0 External ear exam '[]'$  - 0 Specimen Collection (cultures, biopsies, blood, body fluids, etc.) '[]'$  - 0 Specimen(s) / Culture(s) sent or taken to Lab for analysis '[]'$  - 0 Patient Transfer (multiple staff / Civil Service fast streamer / Similar devices) '[]'$  - 0 Simple Staple / Suture removal (25 or less) '[]'$  - 0 Complex Staple / Suture removal (26 or more) '[]'$  - 0 Hypo / Hyperglycemic Management (close monitor of Blood Glucose) '[]'$  - 0 Ankle / Brachial Index (ABI) - do not check if billed separately X- 1 5 Vital Signs Has the patient been seen at the hospital within the last three years: Yes Total Score: 90 Level Of Care: New/Established - Level 3 Electronic Signature(s) Signed: 05/08/2022 3:59:14 PM By: [name] Entered By: [name] on 05/08/2022 09:41:45 Romero Liner Cardenas ([id number]) [id number] Page 3 of 7 -------------------------------------------------------------------------------- Encounter Discharge Information Details Patient Name: Date of Service: [name] 05/08/2022 8:45 A M Medical Record Number:  [id number] Patient Account Number: [id number] Date of Birth/Sex: Treating RN: [birth year] (72 y.o. [name], [name] Primary Care Saleen Peden: [name], [name] Other Clinician: Referring Marleigh Kaylor: Treating Gaylyn Berish/Extender: [name] in Treatment: 18 Encounter Discharge Information Items Discharge Condition: Stable Ambulatory Status: Ambulatory Discharge Destination: Home Transportation: Private Auto Accompanied By: self Schedule Follow-up Appointment: Yes Clinical Summary of Care: Patient Declined Electronic Signature(s) Signed: 05/08/2022 3:59:14 PM By: [name] Entered By: [name] on 05/08/2022 09:42:16 -------------------------------------------------------------------------------- Lower Extremity Assessment Details Patient Name: Date of Service: Marissa Marissa Compton Cardenas. 05/08/2022 8:45 A M Medical Record Number: [id number] Patient Account Number: [id number] Date of Birth/Sex: Treating RN: 01-08-1951 (72 y.o. F) Primary Care Paulette Rockford: [name], [name] Other Clinician: Referring Markham Dumlao: Treating Taniesha Glanz/Extender: [name] in Treatment: 18 Electronic Signature(s) Signed: 05/08/2022 3:49:00 PM By: Erenest Blank Entered By: Erenest Blank on 05/08/2022 08:53:55 -------------------------------------------------------------------------------- Multi-Disciplinary Care Plan Details Patient Name: Date of Service: Marissa Marissa Compton Cardenas. 05/08/2022 8:45 A M Medical Record Number: [id number] Patient Account Number: [id number] Date of Birth/Sex: Treating RN: 11-16-1950 (72 y.o. [name], [name] Primary Care Kyilee Gregg: [name], [name] Other Clinician: Referring Tamarcus Condie: Treating Nicolina Hirt/Extender: [name] in Treatment: 18 Active Inactive Pain, Acute or Chronic Nursing Diagnoses: Pain, acute or chronic: actual or potential Potential alteration in comfort,  pain Goals: Patient will verbalize adequate pain control and receive pain control interventions during procedures as needed Date Initiated: 01/02/2022 Target Resolution Date: 05/11/2022 Goal Status: Active Patient/caregiver will verbalize adequate pain control between visits Date Initiated: 01/02/2022 Target Resolution Date: 05/11/2022 Goal Status: Active Interventions: Encourage patient to take pain medications as prescribed DAIA, HEBNER Cardenas ([id number]) [id number] Page 4 of 7 Provide education on pain management Reposition patient for comfort Treatment Activities: Administer pain control measures as ordered :  01/02/2022 Notes: Pressure Nursing Diagnoses: Knowledge deficit related to management of pressures ulcers Goals: Patient/caregiver will verbalize risk factors for pressure ulcer development Date Initiated: 01/02/2022 Target Resolution Date: 05/11/2022 Goal Status: Active Interventions: Assess: immobility, friction, shearing, incontinence upon admission and as needed Assess offloading mechanisms upon admission and as needed Provide education on pressure ulcers Treatment Activities: T ordered outside of clinic : 01/02/2022 est Notes: Electronic Signature(s) Signed: 05/08/2022 3:59:14 PM By: [name] Entered By: [name] on 05/08/2022 09:16:04 -------------------------------------------------------------------------------- Pain Assessment Details Patient Name: Date of Service: Marissa Perking Cardenas. 05/08/2022 8:45 A M Medical Record Number: [id number] Patient Account Number: [id number] Date of Birth/Sex: Treating RN: February 20, 1951 (72 y.o. F) Primary Care Nolita Kutter: [name], [name] Other Clinician: Referring Nicklous Aburto: Treating Lacole Komorowski/Extender: [name] in Treatment: 18 Active Problems Location of Pain Severity and Description of Pain Patient Has Paino No Site Locations Pain Management and  Medication Current Pain Management: Electronic Signature(s) Signed: 05/08/2022 3:49:00 PM By: Erenest Blank Entered By: Erenest Blank on 05/08/2022 08:53:48 [name] ([id number]) [id number] Page 5 of 7 -------------------------------------------------------------------------------- Patient/Caregiver Education Details Patient Name: Date of Service: [name] 2/28/2024andnbsp8:45 [place] Record Number: [id number] Patient Account Number: [id number] Date of Birth/Gender: Treating RN: 1950/08/01 (72 y.o. [name], [name] Primary Care Physician: [name], [name] Other Clinician: Referring Physician: Treating Physician/Extender: [name] in Treatment: 18 Education Assessment Education Provided To: Patient Education Topics Provided Wound/Skin Impairment: Methods: Explain/Verbal Responses: Reinforcements needed, State content correctly [employer]) Signed: 05/08/2022 3:59:14 PM By: [name] Entered By: [name] on 05/08/2022 09:16:16 -------------------------------------------------------------------------------- Wound Assessment Details Patient Name: Date of Service: Marissa Marissa Compton Cardenas. 05/08/2022 8:45 A M Medical Record Number: [id number] Patient Account Number: [id number] Date of Birth/Sex: Treating RN: Sep 13, 1950 (72 y.o. F) Primary Care Jaxxson Cavanah: [name], [name] Other Clinician: Referring Christie Copley: Treating Brin Ruggerio/Extender: [name] in Treatment: 18 Wound Status Wound Number: 1 Primary Etiology: Pressure Ulcer Wound Location: Sacrum Wound Status: Open Wounding Event: Pressure Injury Comorbid History: Colitis Date Acquired: 10/09/2021 Weeks Of Treatment: 18 Clustered Wound: No Photos Wound Measurements Length: (cm) 0 Width: (cm) 0 Depth: (cm) 1 Area: (cm) Volume: (cm) [name], [name] ([id number]) Wound  Description Classification: Category/Stage IV Exudate Amount: Medium Exudate Type: Purulent Exudate Color: yellow, brown, green Foul Odor After Cleansing: Due to Product Use: .7 % Reduction in Area: 37.7% .4 % Reduction in Volume: 10.9% Epithelialization: Small (1-33%) 0.22 Tunneling: No 0.22 Undermining: Yes Starting Position (o'clock): 8 Ending Position (o'clock): 12 Maximum Distance: (cm) 1.5 [id number] Page 6 of 7 Yes No Wound Bed Granulation Amount: Large (67-100%) Granulation Quality: Pink Necrotic Amount: Small (1-33%) Necrotic Quality: Adherent Slough Periwound Skin Texture Texture Color No Abnormalities Noted: Yes No Abnormalities Noted: No Erythema: Yes Moisture Erythema Location: Circumferential No Abnormalities Noted: Yes Temperature / Pain Temperature: No Abnormality Treatment Notes Wound #1 (Sacrum) Cleanser Wound Cleanser Discharge Instruction: Cleanse the wound with wound cleanser prior to applying a clean dressing using gauze sponges, not tissue or cotton balls. Peri-Wound Care Skin Prep Discharge Instruction: Use skin prep as directed Topical Primary Dressing Hydrofera Blue Classic Foam, 4x4 in Discharge Instruction: Moisten with saline prior to applying to wound bed Secondary Dressing Zetuvit Plus Silicone Border Dressing 4x4 (in/in) Discharge Instruction: Apply silicone border over primary dressing as directed. Secured With Compression Wrap Compression Stockings Environmental education officer) Signed: 05/08/2022 3:49:00 PM By: Barbarann Ehlers,  Doyle Askew By: Erenest Blank on 05/08/2022 09:10:30 -------------------------------------------------------------------------------- Vitals Details Patient Name: Date of Service: [name] 05/08/2022 8:45 A M Medical Record Number: [id number] Patient Account Number: [id number] Date of Birth/Sex: Treating RN: 1950-12-27 (72 y.o. F) Primary Care Edie Darley: [name],  [name] Other Clinician: Referring Katelynn Heidler: Treating Heidi Maclin/Extender: [name] in Treatment: 18 Vital Signs Time Taken: 08:53 Temperature (F): 98.7 Height (in): 64 Pulse (bpm): 65 Weight (lbs): 97 Respiratory Rate (breaths/min): 18 Body Mass Index (BMI): 16.6 Blood Pressure (mmHg): 98/57 Reference Range: 80 - 120 mg / dl [name], [name] ([id number]) [id number] Page 7 of 7 Electronic Signature(s) Signed: 05/08/2022 3:49:00 PM By: Erenest Blank Entered By: Erenest Blank on 05/08/2022 08:53:42

## 2022-05-10 ENCOUNTER — Encounter (HOSPITAL_BASED_OUTPATIENT_CLINIC_OR_DEPARTMENT_OTHER): Payer: 59 | Admitting: Internal Medicine

## 2022-05-11 LAB — AEROBIC CULTURE W GRAM STAIN (SUPERFICIAL SPECIMEN): Gram Stain: NONE SEEN

## 2022-05-13 ENCOUNTER — Encounter (HOSPITAL_BASED_OUTPATIENT_CLINIC_OR_DEPARTMENT_OTHER): Payer: 59 | Attending: Internal Medicine | Admitting: Internal Medicine

## 2022-05-13 DIAGNOSIS — L89153 Pressure ulcer of sacral region, stage 3: Secondary | ICD-10-CM | POA: Insufficient documentation

## 2022-05-13 DIAGNOSIS — E43 Unspecified severe protein-calorie malnutrition: Secondary | ICD-10-CM | POA: Diagnosis not present

## 2022-05-13 DIAGNOSIS — L89154 Pressure ulcer of sacral region, stage 4: Secondary | ICD-10-CM | POA: Insufficient documentation

## 2022-05-15 ENCOUNTER — Encounter (HOSPITAL_BASED_OUTPATIENT_CLINIC_OR_DEPARTMENT_OTHER): Payer: 59 | Admitting: Internal Medicine

## 2022-05-15 DIAGNOSIS — L89154 Pressure ulcer of sacral region, stage 4: Secondary | ICD-10-CM | POA: Diagnosis not present

## 2022-05-15 DIAGNOSIS — L89153 Pressure ulcer of sacral region, stage 3: Secondary | ICD-10-CM | POA: Diagnosis not present

## 2022-05-15 DIAGNOSIS — E43 Unspecified severe protein-calorie malnutrition: Secondary | ICD-10-CM | POA: Diagnosis not present

## 2022-05-16 NOTE — Progress Notes (Signed)
DAIRY, WISSER Cardenas ([id number]) [id number] Page 1 of 4 Visit Report for 05/13/2022 Arrival Information Details Patient Name: Date of Service: [name] 05/13/2022 9:45 A M Medical Record Number: [id number] Patient Account Number: [id number] Date of Birth/Sex: Treating RN: Jul 19, 1950 (72 y.o. F) Primary Care [name]: Marissa Mac., [name] Other Clinician: Referring [name]: Treating [name]/Extender: [name] in Treatment: 18 Visit Information History Since Last Visit Added or deleted any medications: No Patient Arrived: Ambulatory Any new allergies or adverse reactions: No Arrival Time: 10:33 Had a fall or experienced change in No Accompanied By: caregiver activities of daily living that may affect Transfer Assistance: None risk of falls: Patient Identification Verified: Yes Signs or symptoms of abuse/neglect since last visito No Secondary Verification Process Completed: Yes Hospitalized since last visit: No Patient Requires Transmission-Based Precautions: No Implantable device outside of the clinic excluding No Patient Has Alerts: No cellular tissue based products placed in the center since last visit: Has Dressing in Place as Prescribed: No Pain Present Now: No Electronic Signature(s) Signed: 05/14/2022 3:59:08 PM By: [name] Entered By: [name] on 05/13/2022 11:06:16 -------------------------------------------------------------------------------- Clinic Level of Care Assessment Details Patient Name: Date of Service: [name] 05/13/2022 9:45 A M Medical Record Number: [id number] Patient Account Number: [id number] Date of Birth/Sex: Treating RN: [name], 1952 (72 y.o. F) Primary Care Xandrea Clarey: Marissa Mac., [name] Other Clinician: Referring [name]: Treating [name]/Extender: [name] in Treatment: 18 Clinic Level of Care Assessment Items TOOL 4 Quantity  Score X- 1 0 Use when only an EandM is performed on FOLLOW-UP visit ASSESSMENTS - Nursing Assessment / Reassessment X- 1 10 Reassessment of Marissa-morbidities (includes updates in patient status) X- 1 5 Reassessment of Adherence to Treatment Plan ASSESSMENTS - Wound and Skin A ssessment / Reassessment X - Simple Wound Assessment / Reassessment - one wound 1 5 '[]'$  - 0 Complex Wound Assessment / Reassessment - multiple wounds '[]'$  - 0 Dermatologic / Skin Assessment (not related to wound area) ASSESSMENTS - Focused Assessment '[]'$  - 0 Circumferential Edema Measurements - multi extremities '[]'$  - 0 Nutritional Assessment / Counseling / Intervention '[]'$  - 0 Lower Extremity Assessment (monofilament, tuning fork, pulses) '[]'$  - 0 Peripheral Arterial Disease Assessment (using hand held doppler) ASSESSMENTS - Ostomy and/or Continence Assessment and Care '[]'$  - 0 Incontinence Assessment and Management '[]'$  - 0 Ostomy Care Assessment and Management (repouching, etc.) PROCESS - Coordination of Care X - Simple Patient / Family Education for ongoing care 1 [address], [place] Cardenas ([id number]) [phone] Page 2 of 4 '[]'$  - 0 Complex (extensive) Patient / Family Education for ongoing care '[]'$  - 0 Staff obtains Programmer, systems, Records, T Results / Process Orders est '[]'$  - 0 Staff telephones HHA, Nursing Homes / Clarify orders / etc '[]'$  - 0 Routine Transfer to another Facility (non-emergent condition) '[]'$  - 0 Routine Hospital Admission (non-emergent condition) '[]'$  - 0 New Admissions / Biomedical engineer / Ordering NPWT Apligraf, etc. , '[]'$  - 0 Emergency Hospital Admission (emergent condition) '[]'$  - 0 Simple Discharge Coordination '[]'$  - 0 Complex (extensive) Discharge Coordination PROCESS - Special Needs '[]'$  - 0 Pediatric / Minor Patient Management '[]'$  - 0 Isolation Patient Management '[]'$  - 0 Hearing / Language / Visual special needs '[]'$  - 0 Assessment of Community assistance  (transportation, D/C planning, etc.) '[]'$  - 0 Additional assistance / Altered mentation '[]'$  - 0 Support Surface(s) Assessment (bed, cushion, seat, etc.) INTERVENTIONS - Wound Cleansing / Measurement X - Simple  Wound Cleansing - one wound 1 5 '[]'$  - 0 Complex Wound Cleansing - multiple wounds '[]'$  - 0 Wound Imaging (photographs - any number of wounds) '[]'$  - 0 Wound Tracing (instead of photographs) '[]'$  - 0 Simple Wound Measurement - one wound '[]'$  - 0 Complex Wound Measurement - multiple wounds INTERVENTIONS - Wound Dressings X - Small Wound Dressing one or multiple wounds 1 10 '[]'$  - 0 Medium Wound Dressing one or multiple wounds '[]'$  - 0 Large Wound Dressing one or multiple wounds '[]'$  - 0 Application of Medications - topical '[]'$  - 0 Application of Medications - injection INTERVENTIONS - Miscellaneous '[]'$  - 0 External ear exam '[]'$  - 0 Specimen Collection (cultures, biopsies, blood, body fluids, etc.) '[]'$  - 0 Specimen(s) / Culture(s) sent or taken to Lab for analysis '[]'$  - 0 Patient Transfer (multiple staff / Civil Service fast streamer / Similar devices) '[]'$  - 0 Simple Staple / Suture removal (25 or less) '[]'$  - 0 Complex Staple / Suture removal (26 or more) '[]'$  - 0 Hypo / Hyperglycemic Management (close monitor of Blood Glucose) '[]'$  - 0 Ankle / Brachial Index (ABI) - do not check if billed separately X- 1 5 Vital Signs Has the patient been seen at the hospital within the last three years: Yes Total Score: 55 Level Of Care: New/Established - Level 2 Electronic Signature(s) Signed: 05/14/2022 3:59:08 PM By: [name] Entered By: [name] on 05/13/2022 11:06:07 [name] ([id number]) [id number] Page 3 of 4 -------------------------------------------------------------------------------- Encounter Discharge Information Details Patient Name: Date of Service: [name] 05/13/2022 9:45 A M Medical Record Number: [id number] Patient Account Number:  [id number] Date of Birth/Sex: Treating RN: 08-Cardenas-1952 (72 y.o. F) Primary Care [name]: Marissa Mac., [name] End Clinician: Erenest Cardenas Referring Temprance Wyre: Treating Tavious Griesinger/Extender: Genice Rouge., [name] in Treatment: 18 Encounter Discharge Information Items Discharge Condition: Stable Ambulatory Status: Ambulatory Discharge Destination: Home Transportation: Private Auto Accompanied By: caregiver Schedule Follow-up Appointment: Yes Clinical Summary of Care: Electronic Signature(s) Signed: 05/14/2022 3:59:08 PM By: [name] Entered By: [name] on 05/13/2022 11:06:55 -------------------------------------------------------------------------------- Patient/Caregiver Education Details Patient Name: Date of Service: [name] 3/4/2024andnbsp9:45 [place] Record Number: [id number] Patient Account Number: [id number] Date of Birth/Gender: Treating RN: Aug 06, 1950 (72 y.o. F) Primary Care Physician: Marissa Mac., [name] End Clinician: Erenest Cardenas Referring Physician: Treating Physician/Extender: Genice Rouge., [name] in Treatment: 18 Education Assessment Education Provided To: Caregiver Education Topics Provided Electronic Signature(s) Signed: 05/14/2022 3:59:08 PM By: [name] Entered By: [name] on 05/13/2022 11:06:38 -------------------------------------------------------------------------------- Wound Assessment Details Patient Name: Date of Service: [name] 05/13/2022 9:45 A M Medical Record Number: [id number] Patient Account Number: [id number] Date of Birth/Sex: Treating RN: 11-Mar-1951 (72 y.o. F) Primary Care Taegen Delker: Marissa Mac., [name] Other Clinician: Referring Keymani Glynn: Treating Woodford Strege/Extender: [name] in Treatment: 18 Wound Status Wound Number: 1 Primary Etiology: Pressure Ulcer Wound Location: Sacrum Wound Status: Open Wounding  Event: Pressure Injury Date Acquired: 10/09/2021 Weeks Of Treatment: 18 Clustered Wound: No Wound Measurements Length: (cm) 0. DEICY, HOLLAR Cardenas ([id number]) Width: (cm) Depth: (cm) Area: (cm) Volume: (cm) 7 % Reduction in Area: 37.7% [id number] Page 4 of 4 0.4 % Reduction in Volume: 10.9% 1 0.22 0.22 Wound Description Classification: Category/Stage IV Exudate Amount: Medium Exudate Type: Purulent Exudate Color: yellow, brown, green Periwound Skin Texture Texture Color No Abnormalities Noted: No No Abnormalities Noted: No Moisture No Abnormalities Noted: No Electronic Signature(s) Signed: 05/14/2022 3:59:08  PM By: [name] Entered By: [name] on 05/13/2022 10:34:00 -------------------------------------------------------------------------------- Vitals Details Patient Name: Date of Service: [name], [name]. 05/13/2022 9:45 A M Medical Record Number: [id number] Patient Account Number: [id number] Date of Birth/Sex: Treating RN: 1950-06-24 (72 y.o. F) Primary Care Kentarius Partington: Marissa Mac., [name] Other Clinician: Referring Evah Rashid: Treating Whittaker Lenis/Extender: [name] in Treatment: 18 Vital Signs Time Taken: 10:33 Temperature (F): 97.9 Height (in): 64 Pulse (bpm): 75 Weight (lbs): 97 Respiratory Rate (breaths/min): 18 Body Mass Index (BMI): 16.6 Blood Pressure (mmHg): 111/72 Reference Range: 80 - 120 mg / dl Electronic Signature(s) Signed: 05/14/2022 3:59:08 PM By: [name] Entered By: [name] on 05/13/2022 10:33:52

## 2022-05-16 NOTE — Progress Notes (Signed)
AARADHANA, SARDO R ([id number]) [id number] Page 1 of 1 Visit Report for 05/13/2022 SuperBill Details Patient Name: Date of Service: [name] 05/13/2022 Medical Record Number: [id number] Patient Account Number: [id number] Date of Birth/Sex: Treating RN: [birth year] (72 y.o. F) Primary Care Provider: [name], [name] Other Clinician: Referring Provider: Treating Provider/Extender: Dalene Seltzer in Treatment: 18 Diagnosis Coding ICD-10 Codes Code Description [phone] Pressure ulcer of sacral region, stage 4 M62.81 Muscle weakness (generalized) E43 Unspecified severe protein-calorie malnutrition Facility Procedures CPT4 Code Description Modifier Quantity [id number] [phone] - WOUND CARE VISIT-LEV 2 EST PT 1 Electronic Signature(s) Signed: 05/13/2022 11:16:06 AM By: Kalman Shan DO Signed: 05/14/2022 3:59:08 PM By: Erenest Blank Entered By: Erenest Blank on 05/13/2022 11:07:04

## 2022-05-17 ENCOUNTER — Encounter (HOSPITAL_BASED_OUTPATIENT_CLINIC_OR_DEPARTMENT_OTHER): Payer: 59 | Admitting: Internal Medicine

## 2022-05-17 DIAGNOSIS — L89154 Pressure ulcer of sacral region, stage 4: Secondary | ICD-10-CM | POA: Diagnosis not present

## 2022-05-17 DIAGNOSIS — R030 Elevated blood-pressure reading, without diagnosis of hypertension: Secondary | ICD-10-CM | POA: Diagnosis not present

## 2022-05-17 DIAGNOSIS — Z79899 Other long term (current) drug therapy: Secondary | ICD-10-CM | POA: Diagnosis not present

## 2022-05-17 DIAGNOSIS — E2740 Unspecified adrenocortical insufficiency: Secondary | ICD-10-CM | POA: Diagnosis not present

## 2022-05-17 DIAGNOSIS — K861 Other chronic pancreatitis: Secondary | ICD-10-CM | POA: Diagnosis not present

## 2022-05-17 DIAGNOSIS — G4700 Insomnia, unspecified: Secondary | ICD-10-CM | POA: Diagnosis not present

## 2022-05-17 DIAGNOSIS — J449 Chronic obstructive pulmonary disease, unspecified: Secondary | ICD-10-CM | POA: Diagnosis not present

## 2022-05-17 DIAGNOSIS — E43 Unspecified severe protein-calorie malnutrition: Secondary | ICD-10-CM | POA: Diagnosis not present

## 2022-05-17 DIAGNOSIS — M542 Cervicalgia: Secondary | ICD-10-CM | POA: Diagnosis not present

## 2022-05-17 DIAGNOSIS — F32A Depression, unspecified: Secondary | ICD-10-CM | POA: Diagnosis not present

## 2022-05-17 DIAGNOSIS — R64 Cachexia: Secondary | ICD-10-CM | POA: Diagnosis not present

## 2022-05-17 DIAGNOSIS — Z87891 Personal history of nicotine dependence: Secondary | ICD-10-CM | POA: Diagnosis not present

## 2022-05-17 DIAGNOSIS — Z681 Body mass index (BMI) 19 or less, adult: Secondary | ICD-10-CM | POA: Diagnosis not present

## 2022-05-17 DIAGNOSIS — L89153 Pressure ulcer of sacral region, stage 3: Secondary | ICD-10-CM | POA: Diagnosis not present

## 2022-05-18 NOTE — Progress Notes (Signed)
TAILER, WAGENER Cardenas ([id number]) [id number] Page 1 of 6 Visit Report for 05/15/2022 HPI Details Patient Name: Date of Service: [name] 05/15/2022 10:15 A M Medical Record Number: [id number] Patient Account Number: [id number] Date of Birth/Sex: Treating RN: 1950-08-06 (72 y.o. F) Primary Care Provider: [name], [name] Other Clinician: Referring Provider: Treating Provider/Extender: Scarlette Slice in Treatment: 19 History of Present Illness HPI Description: 01-02-22 upon evaluation today patient presents for initial inspection here in our clinic concerning a wound that she has over the sacral region. This is stated to be present since around the beginning of August 2023. She currently resides in a assisted nursing facility. She does seem to be able to answer questions fully today. Subsequently I do note that she is a little on the thin side but again other than the protein calorie malnutrition she is minimally weak but still does get up and move around some but she also tells me that she "sits a lot". She has not had any x-rays of the sacral region at this point. 01-09-2022 upon evaluation today patient's wound in the sacral area actually appears to be doing decently well. Fortunately I do not see any signs of infection which is great news and overall I am extremely pleased with where things stand today. 11/8; this is a patient who lives in some form of small assisted living in [place]. She has a deep wound over the lower part of her coccyx. An x-ray that we ordered from last time did not show any osseous abnormalities. We are supposed to be using Hydrofera Blue in the wound but I am really not sure what they are using to dress this and who is doing it. Talking to our staff they have apparently discussed this with the staff in the facility. She does not have home health. 01-23-2022 upon evaluation today patient appears to be doing  decently well in regard to her wound. She has been tolerating the dressing changes without complication although I am not certain the dressing changes have been done appropriately over the past couple of weeks. We have been trying to get her to come in here we also try to get her into a wound care center in [place] which would be closer for the assisted living facility. Unfortunately neither 1 of those were undertaken up to this point. The patient did end up having some training of the staff from an RN that they brought him to have the staff perform the dressing changes but that being said it does not sound like this has been done correctly. Nonetheless I do believe that based on what we see it would benefit the patient to actually have her come here 3 times a week also think a wound VAC could potentially be a possibility for her which would help to get things moving in a much better direction as far as healing is concerned. We need to get this to fill-in though it looks clean and does not look infected I do think that we need to really get this to fill-in and there is quite a bit of undermining that is to be considered here. 01-30-2022 upon evaluation today patient appears to be doing well currently in regard to her wound which is looking pretty decent but still has quite a bit of space underneath as far as undermining is concerned. I think she might possibly be better with a wound VAC. With that being said if when I do this we  probably only be able to do it 2 times a week at most. I discussed that with the patient today she is okay with that we just need to see if we can get the insurance approval and then of course the scheduling underway. 02-13-2022 upon evaluation today patient appears to be doing well currently in regard to her wound. She actually tells me that it is feeling a lot better which is good news. Fortunately I do not see any signs of active infection at this time. No fevers, chills, nausea,  vomiting, or diarrhea. 02-27-2022 upon evaluation today patient appears to be doing well currently in regard to her wound. She is actually showing some signs of improvement we are still obtaining the approval for the snap VAC which I think could be beneficial in the meantime we been using the Hydrofera Blue rope. 03-27-2022 upon evaluation today patient appears to be doing okay in regard to her wound but there was no dressing in place upon arrival today. Fortunately I see again no evidence of infection. No fevers, chills, nausea, vomiting, or diarrhea. 04-03-2022 upon evaluation today patient did not have a dressing on when she came into the office. With that being said she actually tells me that it was on until yesterday she took a shower and it came loose and then today came off completely. With that being said I really do not think she needs to be taking shower every single day or having to see her here in the clinic 3 times per week on Monday, Wednesday, and Friday and during those times obviously in between she needs to probably avoid showering. For that reason what I advised her to do would be to shower in the mornings on the days that she comes to see [place] that way if the dressing does come off or start to come off we will be changing it anyway. She voiced understanding and tells me she can do that. Unfortunately the facility has nobody to help her with changing the dressings and we are not currently having to do that here in the clinic again. This means that during the time that she was not coming in from December 20 through when I saw her last week on the 17th there was a period of 2 weeks where the wound really was not changed at all as the RN that Myriam Jacobson tell me they had at the facility did not end up working out. This obviously is not good and is not what we expect to see as far as patient care is concerned which is why we are now seeing her 3 times a week here in the clinic. 04-10-2022 upon  evaluation today patient appears to be doing well currently in regard to her wound which is actually showing signs of good granulation epithelization at this point. However she does have quite a bit of drainage and we are little concerned about the possibility of infection. For that reason I think she could benefit from a PCR culture followed by [facility] topical antibiotics. I am also thinking of going ahead and doing gentamicin today as well. 2/7; small wound on the lower sacrum however with considerable degree of undermining from roughly 7-4. PCR culture that was done last week showed "no organisms". We have been using gentamicin and iodoform packing.She lives in some form of group home in [place] I believe. Uncertain about the adequacy of offloading this area 04-24-2022 upon evaluation patient's wound actually is showing signs of doing decently well I  do believe she would benefit from a snap VAC and we discussed that again today. Fortunately I do not see any signs of active infection locally nor systemically which is great news. No fevers, chills, nausea, vomiting, or diarrhea. 05-01-2022 upon evaluation today patient's wound is really doing about the same. Fortunately I do not see any signs of active infection locally nor systemically at this time which is great news with that being said we have gotten approval for the snap VAC and we will going to go ahead and place that today. 05-08-2022 upon evaluation today patient appears to be doing well currently in regard to her wound all things considered but were having a difficult time with a snap VAC suctioning appropriately. With that being said we will get a go ahead and likely avoid the snap VAC at this point at least until we can get the actual brace dressing as bridging ourselves does not seem to be working nearly as well. 3/6; I note the difficulty with a snap VAC which is disappointing but apparently another type of VAC is being considered. Also  a referral to plastic surgery at [facility]. The patient has a very small but deep and at the base circumferential undermining. She is coming in here now 3 times a week for [facility] placement [name], [name] ([id number]) [id number] Page 2 of 6 Electronic Signature(s) Signed: 05/15/2022 4:27:04 PM By: Linton Ham MD Entered By: Linton Ham on 05/15/2022 10:15:15 -------------------------------------------------------------------------------- Physical Exam Details Patient Name: Date of Service: Marissa Perking Cardenas. 05/15/2022 10:15 A M Medical Record Number: [id number] Patient Account Number: [id number] Date of Birth/Sex: Treating RN: 04-29-1950 (72 y.o. F) Primary Care Provider: [name], [name] Other Clinician: Referring Provider: Treating Provider/Extender: Scarlette Slice in Treatment: 19 Constitutional Sitting or standing Blood Pressure is within target range for patient.. Pulse regular and within target range for patient.Marland Kitchen Respirations regular, non-labored and within target range.. Temperature is normal and within the target range for the patient.Marland Kitchen Appears in no distress. Notes Wound exam; small lower sacral wound. However it has some depth and undermines 360 degrees at the base. There is no palpable bone. No infection. No crepitus. The patient states that this has been there for about 4 months Electronic Signature(s) Signed: 05/15/2022 4:27:04 PM By: Linton Ham MD Entered By: Linton Ham on 05/15/2022 10:16:30 -------------------------------------------------------------------------------- Physician Orders Details Patient Name: Date of Service: Marissa Perking Cardenas. 05/15/2022 10:15 A M Medical Record Number: [id number] Patient Account Number: [id number] Date of Birth/Sex: Treating RN: Aug 08, 1950 (72 y.o. F) Primary Care Provider: [name], [name] Other Clinician: Referring Provider: Treating  Provider/Extender: Scarlette Slice in Treatment: 19 Verbal / Phone Orders: No Diagnosis Coding Follow-up Appointments ppointment in 1 week. - w/ Jeri Cos, PA (Dr. Dellia Nims covering) and Elias Else # 4 Wednesday 05/22/22 @ 10:15 (already has appt.) Return A Nurse Visit: - Friday 05/17/22 @ 7:45 (already has appt.) and Monday 05/20/22 @ 8:15 and Friday 03/15 @ 10:30 Other: - Administrator Arbutus Ped) : [phone] PCR culture negative-no [employer] ordered. Regular culture negative as of 05/15/22 Anesthetic (In clinic) Topical Lidocaine 5% applied to wound bed Negative Presssure Wound Therapy Black and White Foam combination - medela wound vac- order through wound Q Off-Loading Turn and reposition every 2 hours Wound Treatment Wound #1 - Sacrum Cleanser: Wound Cleanser (Home Health) 2 x Per Week/30 Days Discharge Instructions: Cleanse the wound with wound cleanser prior to applying a clean dressing using gauze  sponges, not tissue or cotton balls. Peri-Wound Care: Skin Prep (Home Health) 2 x Per Week/30 Days Discharge Instructions: Use skin prep as directed Prim Dressing: Hydrofera Blue Classic Foam, 4x4 in 2 x Per Week/30 Days ary Discharge Instructions: Moisten with saline prior to applying to wound bed Secondary Dressing: Zetuvit Plus Silicone Border Dressing 4x4 (in/in) 2 x Per Week/30 Days Discharge Instructions: Apply silicone border over primary dressing as directed. [name], [name] ([id number]) [id number] Page 3 of 6 Electronic Signature(s) Signed: 05/15/2022 4:27:04 PM By: Linton Ham MD Signed: 05/16/2022 3:53:55 PM By: Erenest Blank Entered By: Erenest Blank on 05/15/2022 10:18:59 -------------------------------------------------------------------------------- Problem List Details Patient Name: Date of Service: Marissa [name]. 05/15/2022 10:15 A M Medical Record Number: [id number] Patient Account Number:  [id number] Date of Birth/Sex: Treating RN: September 17, 1950 (72 y.o. F) Primary Care Provider: [name], [name] Other Clinician: Referring Provider: Treating Provider/Extender: Scarlette Slice in Treatment: 19 Active Problems ICD-10 Encounter Code Description Active Date MDM Diagnosis L89.154 Pressure ulcer of sacral region, stage 4 01/02/2022 No Yes M62.81 Muscle weakness (generalized) 01/02/2022 No Yes E43 Unspecified severe protein-calorie malnutrition 01/02/2022 No Yes Inactive Problems Resolved Problems Electronic Signature(s) Signed: 05/15/2022 4:27:04 PM By: Linton Ham MD Entered By: Linton Ham on 05/15/2022 10:13:48 -------------------------------------------------------------------------------- Progress Note Details Patient Name: Date of Service: Marissa Perking Cardenas. 05/15/2022 10:15 A M Medical Record Number: [id number] Patient Account Number: [id number] Date of Birth/Sex: Treating RN: 17-Feb-1951 (72 y.o. F) Primary Care Provider: [name], [name] Other Clinician: Referring Provider: Treating Provider/Extender: Scarlette Slice in Treatment: 19 Subjective History of Present Illness (HPI) 01-02-22 upon evaluation today patient presents for initial inspection here in our clinic concerning a wound that she has over the sacral region. This is stated to be present since around the beginning of August 2023. She currently resides in a assisted nursing facility. She does seem to be able to answer questions fully today. Subsequently I do note that she is a little on the thin side but again other than the protein calorie malnutrition she is minimally weak but still does get up and move around some but she also tells me that she "sits a lot". She has not had any x-rays of the sacral region at this point. 01-09-2022 upon evaluation today patient's wound in the sacral area actually appears to be doing decently well. Fortunately I do  not see any signs of infection which is great news and overall I am extremely pleased with where things stand today. 11/8; this is a patient who lives in some form of small assisted living in [place]. She has a deep wound over the lower part of her coccyx. An x-ray that we ordered from last time did not show any osseous abnormalities. We are supposed to be using Hydrofera Blue in the wound but I am really not sure what they are using to dress this and who is doing it. Talking to our staff they have apparently discussed this with the staff in the facility. She does not have home health. 01-23-2022 upon evaluation today patient appears to be doing decently well in regard to her wound. She has been tolerating the dressing changes without complication although I am not certain the dressing changes have been done appropriately over the past couple of weeks. We have been trying to get her to come in here we also try to get her into a wound care center in [place] which would be closer for  the assisted living facility. Unfortunately neither 1 of those were [name], [name] ([id number]) [id number] Page 4 of 6 undertaken up to this point. The patient did end up having some training of the staff from an RN that they brought him to have the staff perform the dressing changes but that being said it does not sound like this has been done correctly. Nonetheless I do believe that based on what we see it would benefit the patient to actually have her come here 3 times a week also think a wound VAC could potentially be a possibility for her which would help to get things moving in a much better direction as far as healing is concerned. We need to get this to fill-in though it looks clean and does not look infected I do think that we need to really get this to fill-in and there is quite a bit of undermining that is to be considered here. 01-30-2022 upon evaluation today patient appears to be  doing well currently in regard to her wound which is looking pretty decent but still has quite a bit of space underneath as far as undermining is concerned. I think she might possibly be better with a wound VAC. With that being said if when I do this we probably only be able to do it 2 times a week at most. I discussed that with the patient today she is okay with that we just need to see if we can get the insurance approval and then of course the scheduling underway. 02-13-2022 upon evaluation today patient appears to be doing well currently in regard to her wound. She actually tells me that it is feeling a lot better which is good news. Fortunately I do not see any signs of active infection at this time. No fevers, chills, nausea, vomiting, or diarrhea. 02-27-2022 upon evaluation today patient appears to be doing well currently in regard to her wound. She is actually showing some signs of improvement we are still obtaining the approval for the snap VAC which I think could be beneficial in the meantime we been using the Hydrofera Blue rope. 03-27-2022 upon evaluation today patient appears to be doing okay in regard to her wound but there was no dressing in place upon arrival today. Fortunately I see again no evidence of infection. No fevers, chills, nausea, vomiting, or diarrhea. 04-03-2022 upon evaluation today patient did not have a dressing on when she came into the office. With that being said she actually tells me that it was on until yesterday she took a shower and it came loose and then today came off completely. With that being said I really do not think she needs to be taking shower every single day or having to see her here in the clinic 3 times per week on Monday, Wednesday, and Friday and during those times obviously in between she needs to probably avoid showering. For that reason what I advised her to do would be to shower in the mornings on the days that she comes to see [place] that way if the  dressing does come off or start to come off we will be changing it anyway. She voiced understanding and tells me she can do that. Unfortunately the facility has nobody to help her with changing the dressings and we are not currently having to do that here in the clinic again. This means that during the time that she was not coming in from December 20 through when I saw her last  week on the 17th there was a period of 2 weeks where the wound really was not changed at all as the RN that Myriam Jacobson tell me they had at the facility did not end up working out. This obviously is not good and is not what we expect to see as far as patient care is concerned which is why we are now seeing her 3 times a week here in the clinic. 04-10-2022 upon evaluation today patient appears to be doing well currently in regard to her wound which is actually showing signs of good granulation epithelization at this point. However she does have quite a bit of drainage and we are little concerned about the possibility of infection. For that reason I think she could benefit from a PCR culture followed by [facility] topical antibiotics. I am also thinking of going ahead and doing gentamicin today as well. 2/7; small wound on the lower sacrum however with considerable degree of undermining from roughly 7-4. PCR culture that was done last week showed "no organisms". We have been using gentamicin and iodoform packing.She lives in some form of group home in [place] I believe. Uncertain about the adequacy of offloading this area 04-24-2022 upon evaluation patient's wound actually is showing signs of doing decently well I do believe she would benefit from a snap VAC and we discussed that again today. Fortunately I do not see any signs of active infection locally nor systemically which is great news. No fevers, chills, nausea, vomiting, or diarrhea. 05-01-2022 upon evaluation today patient's wound is really doing about the same. Fortunately I do  not see any signs of active infection locally nor systemically at this time which is great news with that being said we have gotten approval for the snap VAC and we will going to go ahead and place that today. 05-08-2022 upon evaluation today patient appears to be doing well currently in regard to her wound all things considered but were having a difficult time with a snap VAC suctioning appropriately. With that being said we will get a go ahead and likely avoid the snap VAC at this point at least until we can get the actual brace dressing as bridging ourselves does not seem to be working nearly as well. 3/6; I note the difficulty with a snap VAC which is disappointing but apparently another type of VAC is being considered. Also a referral to plastic surgery at [facility]. The patient has a very small but deep and at the base circumferential undermining. She is coming in here now 3 times a week for Hydrofera Blue placement Objective Constitutional Sitting or standing Blood Pressure is within target range for patient.. Pulse regular and within target range for patient.Marland Kitchen Respirations regular, non-labored and within target range.. Temperature is normal and within the target range for the patient.Marland Kitchen Appears in no distress. Vitals Time Taken: 9:48 AM, Height: 64 in, Weight: 97 lbs, BMI: 16.6, Temperature: 98.1 F, Pulse: 69 bpm, Respiratory Rate: 16 breaths/min, Blood Pressure: 112/64 mmHg. General Notes: Wound exam; small lower sacral wound. However it has some depth and undermines 360 degrees at the base. There is no palpable bone. No infection. No crepitus. The patient states that this has been there for about 4 months Integumentary (Hair, Skin) Wound #1 status is Open. Original cause of wound was Pressure Injury. The date acquired was: 10/09/2021. The wound has been in treatment 19 weeks. The wound is located on the Sacrum. The wound measures 0.4cm length x 0.4cm width x 1.2cm depth; 0.126cm^2  area and  0.151cm^3 volume. There is no tunneling noted, however, there is undermining starting at 12:00 and ending at 4:00 with a maximum distance of 1.1cm. There is a medium amount of purulent drainage noted. The wound margin is distinct with the outline attached to the wound base. There is large (67-100%) red, pink granulation within the wound bed. There is a small (1-33%) amount of necrotic tissue within the wound bed including Adherent Slough. The periwound skin appearance did not exhibit: Callus, Crepitus, Excoriation, Induration, Rash, Scarring, Dry/Scaly, Maceration, Atrophie Blanche, Cyanosis, Ecchymosis, Hemosiderin Staining, Mottled, Pallor, Rubor, Erythema. Periwound temperature was noted as No Abnormality. The periwound has tenderness on palpation. Assessment [address] [name], [name] ([id number]) [id number] Page 5 of 6 ICD-10 Pressure ulcer of sacral region, stage 4 Muscle weakness (generalized) Unspecified severe protein-calorie malnutrition Plan Follow-up Appointments: Return Appointment in 1 week. - w/ Jeri Cos, PA (Dr. Dellia Nims covering) and Elias Else # 4 Wednesday 05/22/22 @ 10:15 (already has appt.) Nurse Visit: - Friday 05/17/22 @ 7:45 (already has appt.) and Monday 05/20/22 @ 8:15 and Friday 03/15 @ 10:30 Other: - Administrator Arbutus Ped) : [phone] PCR culture negative-no [employer] ordered. Regular culture negative as of 05/15/22 Anesthetic: (In clinic) Topical Lidocaine 5% applied to wound bed Negative Presssure Wound Therapy: Black and White Foam combination - medela wound vac- order through wound Q Off-Loading: Turn and reposition every 2 hours WOUND #1: - Sacrum Wound Laterality: Cleanser: Wound Cleanser (Home Health) 2 x Per Week/30 Days Discharge Instructions: Cleanse the wound with wound cleanser prior to applying a clean dressing using gauze sponges, not tissue or cotton balls. Peri-Wound Care: Skin Prep (Home Health) 2 x Per Week/30  Days Discharge Instructions: Use skin prep as directed Prim Dressing: Hydrofera Blue Classic Foam, 4x4 in 2 x Per Week/30 Days ary Discharge Instructions: Moisten with saline prior to applying to wound bed Secondary Dressing: Zetuvit Plus Silicone Border Dressing 4x4 (in/in) 2 x Per Week/30 Days Discharge Instructions: Apply silicone border over primary dressing as directed. 1. We are continuing with the Hydrofera Blue packing 2. Awaiting a different type of wound VAC 3. Consultation with plastic surgery 4. I am uncertain about how much offloading she is doing also her nutritional parameters will probably need to be checked 5. Fortunately no evidence of infection Electronic Signature(s) Signed: 05/16/2022 4:49:59 PM By: Deon Pilling RN, BSN Signed: 05/17/2022 2:38:35 AM By: Linton Ham MD Previous Signature: 05/15/2022 4:27:04 PM Version By: Linton Ham MD Entered By: Deon Pilling on 05/16/2022 16:49:47 -------------------------------------------------------------------------------- SuperBill Details Patient Name: Date of Service: [name] 05/15/2022 Medical Record Number: [id number] Patient Account Number: [id number] Date of Birth/Sex: Treating RN: 1950/11/28 (72 y.o. F) Primary Care Provider: [name], [name] Other Clinician: Referring Provider: Treating Provider/Extender: Scarlette Slice in Treatment: 19 Diagnosis Coding ICD-10 Codes Code Description [phone] Pressure ulcer of sacral region, stage 4 M62.81 Muscle weakness (generalized) E43 Unspecified severe protein-calorie malnutrition Facility Procedures : CPT4 Code: [id number] Description: [id number] - WOUND CARE VISIT-LEV 3 EST PT Modifier: Quantity: 1 Physician Procedures : CPT4 Code Description Modifier [id number] - WC PHYS LEVEL 3 - EST PT ICD-10 Diagnosis Description L89.154 Pressure ulcer of sacral region, stage 4 M62.81 Muscle weakness (generalized) [name], [name] ([id number])   [phone].pdf Pa E43 Unspecified severe protein-calorie malnutrition Quantity: 1 ge 6 of 6 Electronic Signature(s) Signed: 05/15/2022 4:27:04 PM By: Linton Ham MD Signed: 05/16/2022 4:33:02 PM By: [name] Entered By: [name] on 05/15/2022 10:26:48

## 2022-05-18 NOTE — Progress Notes (Signed)
NABRIA, WALAS R ([id number]) [id number] Page 1 of 7 Visit Report for 05/15/2022 Arrival Information Details Patient Name: Date of Service: [name] 05/15/2022 10:15 A M Medical Record Number: [id number] Patient Account Number: [id number] Date of Birth/Sex: Treating RN: [birth year] (72 y.o. F) Primary Care Evamarie Raetz: [name], [name] Other Clinician: Referring Talibah Colasurdo: Treating Joniece Smotherman/Extender: Ky Barban., [name] in Treatment: 19 Visit Information History Since Last Visit Added or deleted any medications: No Patient Arrived: Ambulatory Any new allergies or adverse reactions: No Arrival Time: 09:47 Had a fall or experienced change in No Accompanied By: self activities of daily living that may affect Transfer Assistance: None risk of falls: Patient Identification Verified: Yes Signs or symptoms of abuse/neglect since last visito No Secondary Verification Process Completed: Yes Hospitalized since last visit: No Patient Requires Transmission-Based Precautions: No Implantable device outside of the clinic excluding No Patient Has Alerts: No cellular tissue based products placed in the center since last visit: Pain Present Now: No Electronic Signature(s) Signed: 05/16/2022 3:53:55 PM By: Erenest Blank Entered By: Erenest Blank on 05/15/2022 09:48:24 -------------------------------------------------------------------------------- Clinic Level of Care Assessment Details Patient Name: Date of Service: CO [name] 05/15/2022 10:15 A M Medical Record Number: [id number] Patient Account Number: [id number] Date of Birth/Sex: Treating RN: 1950/06/11 (72 y.o. [name], [name] Primary Care Montana Fassnacht: [name], [name] Other Clinician: Referring Trease Bremner: Treating Migdalia Olejniczak/Extender: Ky Barban., [name] in Treatment: 19 Clinic Level of Care Assessment Items TOOL 4 Quantity Score X- 1 0 Use when only  an EandM is performed on FOLLOW-UP visit ASSESSMENTS - Nursing Assessment / Reassessment X- 1 10 Reassessment of Co-morbidities (includes updates in patient status) X- 1 5 Reassessment of Adherence to Treatment Plan ASSESSMENTS - Wound and Skin A ssessment / Reassessment X - Simple Wound Assessment / Reassessment - one wound 1 5 '[]'$  - 0 Complex Wound Assessment / Reassessment - multiple wounds '[]'$  - 0 Dermatologic / Skin Assessment (not related to wound area) ASSESSMENTS - Focused Assessment '[]'$  - 0 Circumferential Edema Measurements - multi extremities '[]'$  - 0 Nutritional Assessment / Counseling / Intervention '[]'$  - 0 Lower Extremity Assessment (monofilament, tuning fork, pulses) '[]'$  - 0 Peripheral Arterial Disease Assessment (using hand held doppler) ASSESSMENTS - Ostomy and/or Continence Assessment and Care '[]'$  - 0 Incontinence Assessment and Management '[]'$  - 0 Ostomy Care Assessment and Management (repouching, etc.) PROCESS - Coordination of Care X - Simple Patient / Family Education for ongoing care 1 15 [place], Jud R ([id number]) [phone].pdf Page 2 of 7 '[]'$  - 0 Complex (extensive) Patient / Family Education for ongoing care X- 1 10 Staff obtains Programmer, systems, Records, T Results / Process Orders est '[]'$  - 0 Staff telephones HHA, Nursing Homes / Clarify orders / etc '[]'$  - 0 Routine Transfer to another Facility (non-emergent condition) '[]'$  - 0 Routine Hospital Admission (non-emergent condition) '[]'$  - 0 New Admissions / Biomedical engineer / Ordering NPWT Apligraf, etc. , '[]'$  - 0 Emergency Hospital Admission (emergent condition) X- 1 10 Simple Discharge Coordination '[]'$  - 0 Complex (extensive) Discharge Coordination PROCESS - Special Needs '[]'$  - 0 Pediatric / Minor Patient Management '[]'$  - 0 Isolation Patient Management '[]'$  - 0 Hearing / Language / Visual special needs '[]'$  - 0 Assessment of Community assistance (transportation, D/C planning,  etc.) '[]'$  - 0 Additional assistance / Altered mentation '[]'$  - 0 Support Surface(s) Assessment (bed, cushion, seat, etc.) INTERVENTIONS - Wound Cleansing / Measurement X - Simple Wound Cleansing - one wound  1 5 '[]'$  - 0 Complex Wound Cleansing - multiple wounds X- 1 5 Wound Imaging (photographs - any number of wounds) '[]'$  - 0 Wound Tracing (instead of photographs) X- 1 5 Simple Wound Measurement - one wound '[]'$  - 0 Complex Wound Measurement - multiple wounds INTERVENTIONS - Wound Dressings X - Small Wound Dressing one or multiple wounds 1 10 '[]'$  - 0 Medium Wound Dressing one or multiple wounds '[]'$  - 0 Large Wound Dressing one or multiple wounds X- 1 5 Application of Medications - topical '[]'$  - 0 Application of Medications - injection INTERVENTIONS - Miscellaneous '[]'$  - 0 External ear exam '[]'$  - 0 Specimen Collection (cultures, biopsies, blood, body fluids, etc.) '[]'$  - 0 Specimen(s) / Culture(s) sent or taken to Lab for analysis '[]'$  - 0 Patient Transfer (multiple staff / Civil Service fast streamer / Similar devices) '[]'$  - 0 Simple Staple / Suture removal (25 or less) '[]'$  - 0 Complex Staple / Suture removal (26 or more) '[]'$  - 0 Hypo / Hyperglycemic Management (close monitor of Blood Glucose) '[]'$  - 0 Ankle / Brachial Index (ABI) - do not check if billed separately X- 1 5 Vital Signs Has the patient been seen at the hospital within the last three years: Yes Total Score: 90 Level Of Care: New/Established - Level 3 Electronic Signature(s) Signed: 05/16/2022 4:33:02 PM By: [name] Entered By: [name] on 05/15/2022 10:26:42 [name] ([id number]) [id number] Page 3 of 7 -------------------------------------------------------------------------------- Encounter Discharge Information Details Patient Name: Date of Service: [name] 05/15/2022 10:15 A M Medical Record Number: [id number] Patient Account Number: [id number] Date of Birth/Sex:  Treating RN: 02-Aug-1950 (72 y.o. [name], [name] Primary Care Lun Muro: [name], [name] Other Clinician: Referring Makell Cyr: Treating Leshon Armistead/Extender: Ky Barban., [name] in Treatment: 19 Encounter Discharge Information Items Discharge Condition: Stable Ambulatory Status: Ambulatory Discharge Destination: Home Transportation: Private Auto Accompanied By: caregiver/aide/transporter Schedule Follow-up Appointment: Yes Clinical Summary of Care: Patient Declined Electronic Signature(s) Signed: 05/16/2022 4:33:02 PM By: [name] Entered By: [name] on 05/15/2022 10:27:18 -------------------------------------------------------------------------------- Lower Extremity Assessment Details Patient Name: Date of Service: [name] 05/15/2022 10:15 A M Medical Record Number: [id number] Patient Account Number: [id number] Date of Birth/Sex: Treating RN: January 15, 1951 (72 y.o. F) Primary Care Andrw Mcguirt: [name], [name] Other Clinician: Referring Vyla Pint: Treating Aymee Fomby/Extender: Scarlette Slice in Treatment: 19 Electronic Signature(s) Signed: 05/16/2022 3:53:55 PM By: Erenest Blank Entered By: Erenest Blank on 05/15/2022 09:49:36 -------------------------------------------------------------------------------- Multi Wound Chart Details Patient Name: Date of Service: [name] 05/15/2022 10:15 A M Medical Record Number: [id number] Patient Account Number: [id number] Date of Birth/Sex: Treating RN: [birth year] (72 y.o. F) Primary Care Dilynn Munroe: [name], [name] Other Clinician: Referring Favian Kittleson: Treating Lakhia Gengler/Extender: Scarlette Slice in Treatment: 19 Vital Signs Height(in): 64 Pulse(bpm): 69 Weight(lbs): 97 Blood Pressure(mmHg): 112/64 Body Mass Index(BMI): 16.6 Temperature(F): 98.1 Respiratory Rate(breaths/min): 16 [1:Photos:] [N/A:N/A] Sacrum N/A  N/A Wound Location: YOSHIKO, ZUNO ([id number]) [phone].pdf Page 4 of 7 Pressure Injury N/A N/A Wounding Event: Pressure Ulcer N/A N/A Primary Etiology: Colitis N/A N/A Comorbid History: 10/09/2021 N/A N/A Date Acquired: 72 N/A N/A Weeks of Treatment: Open N/A N/A Wound Status: No N/A N/A Wound Recurrence: 0.4x0.4x1.2 N/A N/A Measurements L x W x D (cm) 0.126 N/A N/A A (cm) : rea 0.151 N/A N/A Volume (cm) : 64.30% N/A N/A % Reduction in A rea: 38.90% N/A N/A % Reduction in Volume: 12 Starting  Position 1 (o'clock): 4 Ending Position 1 (o'clock): 1.1 Maximum Distance 1 (cm): Yes N/A N/A Undermining: Category/Stage IV N/A N/A Classification: Medium N/A N/A Exudate A mount: Purulent N/A N/A Exudate Type: yellow, brown, green N/A N/A Exudate Color: Distinct, outline attached N/A N/A Wound Margin: Large (67-100%) N/A N/A Granulation A mount: Red, Pink N/A N/A Granulation Quality: Small (1-33%) N/A N/A Necrotic A mount: Fascia: No N/A N/A Exposed Structures: Fat Layer (Subcutaneous Tissue): No Tendon: No Muscle: No Joint: No Bone: No Excoriation: No N/A N/A Periwound Skin Texture: Induration: No Callus: No Crepitus: No Rash: No Scarring: No Maceration: No N/A N/A Periwound Skin Moisture: Dry/Scaly: No Atrophie Blanche: No N/A N/A Periwound Skin Color: Cyanosis: No Ecchymosis: No Erythema: No Hemosiderin Staining: No Mottled: No Pallor: No Rubor: No No Abnormality N/A N/A Temperature: Yes N/A N/A Tenderness on Palpation: Treatment Notes Electronic Signature(s) Signed: 05/15/2022 4:27:04 PM By: Linton Ham MD Entered By: Linton Ham on 05/15/2022 10:14:02 -------------------------------------------------------------------------------- Multi-Disciplinary Care Plan Details Patient Name: Date of Service: [name] 05/15/2022 10:15 A M Medical Record Number: [id number] Patient Account Number:  [id number] Date of Birth/Sex: Treating RN: [birth year] (72 y.o. [name], [name] Primary Care Theola Cuellar: [name], [name] Other Clinician: Referring Andre Gallego: Treating Marisabel Macpherson/Extender: Ky Barban., [name] in Treatment: 19 Active Inactive Pain, Acute or Chronic Nursing Diagnoses: Pain, acute or chronic: actual or potential Potential alteration in comfort, pain Goals: Patient will verbalize adequate pain control and receive pain control interventions during procedures as needed Date Initiated: 01/02/2022 Target Resolution Date: 06/08/2022 JODECI, MICHELE ([id number]) [phone].pdf Page 5 of 7 Goal Status: Active Patient/caregiver will verbalize adequate pain control between visits Date Initiated: 01/02/2022 Target Resolution Date: 06/08/2022 Goal Status: Active Interventions: Encourage patient to take pain medications as prescribed Provide education on pain management Reposition patient for comfort Treatment Activities: Administer pain control measures as ordered : 01/02/2022 Notes: Pressure Nursing Diagnoses: Knowledge deficit related to management of pressures ulcers Goals: Patient/caregiver will verbalize risk factors for pressure ulcer development Date Initiated: 01/02/2022 Target Resolution Date: 06/08/2022 Goal Status: Active Interventions: Assess: immobility, friction, shearing, incontinence upon admission and as needed Assess offloading mechanisms upon admission and as needed Provide education on pressure ulcers Treatment Activities: T ordered outside of clinic : 01/02/2022 est Notes: Electronic Signature(s) Signed: 05/16/2022 4:33:02 PM By: [name] Entered By: [name] on 05/15/2022 10:26:02 -------------------------------------------------------------------------------- Pain Assessment Details Patient Name: Date of Service: [name] 05/15/2022 10:15 A M Medical Record Number:  [id number] Patient Account Number: [id number] Date of Birth/Sex: Treating RN: June 16, 1950 (72 y.o. F) Primary Care Yoshi Vicencio: [name], [name] Other Clinician: Referring Collin Hendley: Treating Ayaz Sondgeroth/Extender: Scarlette Slice in Treatment: 19 Active Problems Location of Pain Severity and Description of Pain Patient Has Paino No Site Locations Pain Management and Medication Current Pain Management: LORAY, MAPLES ([id number]) [phone].pdf Page 6 of 7 Electronic Signature(s) Signed: 05/16/2022 3:53:55 PM By: Erenest Blank Entered By: Erenest Blank on 05/15/2022 09:49:30 -------------------------------------------------------------------------------- Patient/Caregiver Education Details Patient Name: Date of Service: [name] 3/6/2024andnbsp10:15 A M Medical Record Number: [id number] Patient Account Number: [id number] Date of Birth/Gender: Treating RN: 1950/04/26 (72 y.o. [name], [name] Primary Care Physician: [name], [name] Other Clinician: Referring Physician: Treating Physician/Extender: Ky Barban., [name] in Treatment: 19 Education Assessment Education Provided To: Patient Education Topics Provided Wound/Skin Impairment: Methods: Explain/Verbal Responses: Reinforcements needed, State content correctly [employer]) Signed: 05/16/2022 4:33:02 PM By: [name]  RN Entered By: [name] on 05/15/2022 10:26:13 -------------------------------------------------------------------------------- Wound Assessment Details Patient Name: Date of Service: [name] 05/15/2022 10:15 A M Medical Record Number: [id number] Patient Account Number: [id number] Date of Birth/Sex: Treating RN: Aug 27, 1950 (72 y.o. F) Primary Care Pansy Ostrovsky: [name], [name] Other Clinician: Referring Marabella Popiel: Treating Roel Douthat/Extender: Scarlette Slice in  Treatment: 19 Wound Status Wound Number: 1 Primary Etiology: Pressure Ulcer Wound Location: Sacrum Wound Status: Open Wounding Event: Pressure Injury Comorbid History: Colitis Date Acquired: 10/09/2021 Weeks Of Treatment: 19 Clustered Wound: No Photos Wound Measurements Length: (cm) 0. Width: (cm) 0. Depth: (cm) 1. KHAMIAH, BENEDICTO R ([id number]) Area: (cm) Volume: (cm) 4 % Reduction in Area: 64.3% 4 % Reduction in Volume: 38.9% 2 Tunneling: No [id number].pdf Page 7 of 7 0.126 Undermining: Yes 0.151 Starting Position (o'clock): 12 Ending Position (o'clock): 4 Maximum Distance: (cm) 1.1 Wound Description Classification: Wound Margin: Exudate Amount: Exudate Type: Exudate Color: Category/Stage IV Distinct, outline attached Medium Purulent yellow, brown, green Wound Bed Granulation Amount: Large (67-100%) Exposed Structure Granulation Quality: Red, Pink Fascia Exposed: No Necrotic Amount: Small (1-33%) Fat Layer (Subcutaneous Tissue) Exposed: No Necrotic Quality: Adherent Slough Tendon Exposed: No Muscle Exposed: No Joint Exposed: No Bone Exposed: No Periwound Skin Texture Texture Color No Abnormalities Noted: No No Abnormalities Noted: No Callus: No Atrophie Blanche: No Crepitus: No Cyanosis: No Excoriation: No Ecchymosis: No Induration: No Erythema: No Rash: No Hemosiderin Staining: No Scarring: No Mottled: No Pallor: No Moisture Rubor: No No Abnormalities Noted: No Dry / Scaly: No Temperature / Pain Maceration: No Temperature: No Abnormality Tenderness on Palpation: Yes Electronic Signature(s) Signed: 05/16/2022 3:53:55 PM By: Erenest Blank Entered By: Erenest Blank on 05/15/2022 09:58:53 -------------------------------------------------------------------------------- Vitals Details Patient Name: Date of Service: [name] 05/15/2022 10:15 A M Medical Record Number: [id number] Patient Account Number:  [id number] Date of Birth/Sex: Treating RN: 1950/08/02 (72 y.o. F) Primary Care Jaskaran Dauzat: [name], [name] Other Clinician: Referring Hudsyn Barich: Treating Tifanie Gardiner/Extender: Scarlette Slice in Treatment: 19 Vital Signs Time Taken: 09:48 Temperature (F): 98.1 Height (in): 64 Pulse (bpm): 69 Weight (lbs): 97 Respiratory Rate (breaths/min): 16 Body Mass Index (BMI): 16.6 Blood Pressure (mmHg): 112/64 Reference Range: 80 - 120 mg / dl Electronic Signature(s) Signed: 05/16/2022 3:53:55 PM By: Erenest Blank Entered By: Erenest Blank on 05/15/2022 09:48:46

## 2022-05-20 ENCOUNTER — Encounter (HOSPITAL_BASED_OUTPATIENT_CLINIC_OR_DEPARTMENT_OTHER): Payer: 59 | Admitting: General Surgery

## 2022-05-20 DIAGNOSIS — L89153 Pressure ulcer of sacral region, stage 3: Secondary | ICD-10-CM | POA: Diagnosis not present

## 2022-05-20 DIAGNOSIS — E43 Unspecified severe protein-calorie malnutrition: Secondary | ICD-10-CM | POA: Diagnosis not present

## 2022-05-20 DIAGNOSIS — L89154 Pressure ulcer of sacral region, stage 4: Secondary | ICD-10-CM | POA: Diagnosis not present

## 2022-05-22 ENCOUNTER — Encounter (HOSPITAL_BASED_OUTPATIENT_CLINIC_OR_DEPARTMENT_OTHER): Payer: 59 | Admitting: Internal Medicine

## 2022-05-22 DIAGNOSIS — E43 Unspecified severe protein-calorie malnutrition: Secondary | ICD-10-CM | POA: Diagnosis not present

## 2022-05-22 DIAGNOSIS — L89154 Pressure ulcer of sacral region, stage 4: Secondary | ICD-10-CM | POA: Diagnosis not present

## 2022-05-22 DIAGNOSIS — L89153 Pressure ulcer of sacral region, stage 3: Secondary | ICD-10-CM | POA: Diagnosis not present

## 2022-05-22 DIAGNOSIS — H1031 Unspecified acute conjunctivitis, right eye: Secondary | ICD-10-CM | POA: Diagnosis not present

## 2022-05-22 DIAGNOSIS — M6281 Muscle weakness (generalized): Secondary | ICD-10-CM | POA: Diagnosis not present

## 2022-05-22 DIAGNOSIS — Z743 Need for continuous supervision: Secondary | ICD-10-CM | POA: Diagnosis not present

## 2022-05-22 DIAGNOSIS — H579 Unspecified disorder of eye and adnexa: Secondary | ICD-10-CM | POA: Diagnosis not present

## 2022-05-22 NOTE — Progress Notes (Signed)
SHALAYA, GOODSPEED R ([id number]) [id number] Page 1 of 1 Visit Report for 05/20/2022 SuperBill Details Patient Name: Date of Service: [name] 05/20/2022 Medical Record Number: [id number] Patient Account Number: [id number] Date of Birth/Sex: Treating RN: [birth year] (72 y.o. [name], [name] Primary Care Provider: [name], [name] Other Clinician: Referring Provider: Treating Provider/Extender: Burlene Arnt in Treatment: 19 Diagnosis Coding ICD-10 Codes Code Description [phone] Pressure ulcer of sacral region, stage 4 M62.81 Muscle weakness (generalized) E43 Unspecified severe protein-calorie malnutrition Facility Procedures CPT4 Code Description Modifier Quantity [id number] [phone] - WOUND CARE VISIT-LEV 2 EST PT 1 Electronic Signature(s) Signed: 05/21/2022 7:40:18 AM By: Fredirick Maudlin MD FACS Signed: 05/21/2022 4:59:46 PM By: Deon Pilling RN, BSN Entered By: Deon Pilling on 05/20/2022 08:13:06

## 2022-05-22 NOTE — Progress Notes (Signed)
KAYLINN, RIM Cardenas ([id number]) [id number] Page 1 of 4 Visit Report for 05/20/2022 Arrival Information Details Patient Name: Date of Service: [name] 05/20/2022 8:15 A M Medical Record Number: [id number] Patient Account Number: [id number] Date of Birth/Sex: Treating RN: 10/10/1950 (72 y.o. [name], Meta.Reding Primary Care Abraham Entwistle: [name], [name] Other Clinician: Referring Aneya Daddona: Treating Meela Wareing/Extender: [name], [name] in Treatment: 19 Visit Information History Since Last Visit Added or deleted any medications: No Patient Arrived: Ambulatory Any new allergies or adverse reactions: No Arrival Time: 08:05 Had a fall or experienced change in No Accompanied By: self activities of daily living that may affect Transfer Assistance: None risk of falls: Patient Identification Verified: Yes Signs or symptoms of abuse/neglect since last visito No Secondary Verification Process Completed: Yes Hospitalized since last visit: No Patient Requires Transmission-Based Precautions: No Implantable device outside of the clinic excluding No Patient Has Alerts: No cellular tissue based products placed in the center since last visit: Has Dressing in Place as Prescribed: Yes Pain Present Now: No Notes wound vac has not arrive to patient's home. Electronic Signature(s) Signed: 05/21/2022 4:59:46 PM By: Deon Pilling RN, BSN Entered By: Deon Pilling on 05/20/2022 08:12:04 -------------------------------------------------------------------------------- Clinic Level of Care Assessment Details Patient Name: Date of Service: Marissa [name]. 05/20/2022 8:15 A M Medical Record Number: [id number] Patient Account Number: [id number] Date of Birth/Sex: Treating RN: [birth year] (72 y.o. [name], Meta.Reding Primary Care Jazaria Jarecki: [name], [name] Other Clinician: Referring Keshauna Degraffenreid: Treating Roma Bierlein/Extender: [name],  [name] in Treatment: 19 Clinic Level of Care Assessment Items TOOL 4 Quantity Score X- 1 0 Use when only an EandM is performed on FOLLOW-UP visit ASSESSMENTS - Nursing Assessment / Reassessment X- 1 10 Reassessment of Marissa-morbidities (includes updates in patient status) X- 1 5 Reassessment of Adherence to Treatment Plan ASSESSMENTS - Wound and Skin A ssessment / Reassessment X - Simple Wound Assessment / Reassessment - one wound 1 5 '[]'$  - 0 Complex Wound Assessment / Reassessment - multiple wounds '[]'$  - 0 Dermatologic / Skin Assessment (not related to wound area) ASSESSMENTS - Focused Assessment '[]'$  - 0 Circumferential Edema Measurements - multi extremities '[]'$  - 0 Nutritional Assessment / Counseling / Intervention '[]'$  - 0 Lower Extremity Assessment (monofilament, tuning fork, pulses) '[]'$  - 0 Peripheral Arterial Disease Assessment (using hand held doppler) ASSESSMENTS - Ostomy and/or Continence Assessment and Care '[]'$  - 0 Incontinence Assessment and Management HAISLEY, FISCAL Cardenas ([id number]) [id number] Page 2 of 4 '[]'$  - 0 Ostomy Care Assessment and Management (repouching, etc.) PROCESS - Coordination of Care X - Simple Patient / Family Education for ongoing care 1 15 '[]'$  - 0 Complex (extensive) Patient / Family Education for ongoing care X- 1 10 Staff obtains Programmer, systems, Records, T Results / Process Orders est '[]'$  - 0 Staff telephones HHA, Nursing Homes / Clarify orders / etc '[]'$  - 0 Routine Transfer to another Facility (non-emergent condition) '[]'$  - 0 Routine Hospital Admission (non-emergent condition) '[]'$  - 0 New Admissions / Biomedical engineer / Ordering NPWT Apligraf, etc. , '[]'$  - 0 Emergency Hospital Admission (emergent condition) X- 1 10 Simple Discharge Coordination '[]'$  - 0 Complex (extensive) Discharge Coordination PROCESS - Special Needs '[]'$  - 0 Pediatric / Minor Patient Management '[]'$  - 0 Isolation Patient Management '[]'$  -  0 Hearing / Language / Visual special needs '[]'$  - 0 Assessment of Community assistance (transportation, D/C planning, etc.) '[]'$  - 0 Additional assistance / Altered mentation '[]'$  - 0 Support  Surface(s) Assessment (bed, cushion, seat, etc.) INTERVENTIONS - Wound Cleansing / Measurement X - Simple Wound Cleansing - one wound 1 5 '[]'$  - 0 Complex Wound Cleansing - multiple wounds '[]'$  - 0 Wound Imaging (photographs - any number of wounds) '[]'$  - 0 Wound Tracing (instead of photographs) '[]'$  - 0 Simple Wound Measurement - one wound '[]'$  - 0 Complex Wound Measurement - multiple wounds INTERVENTIONS - Wound Dressings X - Small Wound Dressing one or multiple wounds 1 10 '[]'$  - 0 Medium Wound Dressing one or multiple wounds '[]'$  - 0 Large Wound Dressing one or multiple wounds '[]'$  - 0 Application of Medications - topical '[]'$  - 0 Application of Medications - injection INTERVENTIONS - Miscellaneous '[]'$  - 0 External ear exam '[]'$  - 0 Specimen Collection (cultures, biopsies, blood, body fluids, etc.) '[]'$  - 0 Specimen(s) / Culture(s) sent or taken to Lab for analysis '[]'$  - 0 Patient Transfer (multiple staff / Civil Service fast streamer / Similar devices) '[]'$  - 0 Simple Staple / Suture removal (25 or less) '[]'$  - 0 Complex Staple / Suture removal (26 or more) '[]'$  - 0 Hypo / Hyperglycemic Management (close monitor of Blood Glucose) '[]'$  - 0 Ankle / Brachial Index (ABI) - do not check if billed separately '[]'$  - 0 Vital Signs Has the patient been seen at the hospital within the last three years: Yes Total Score: 70 Level Of Care: New/Established - Level 2 [name], [name] ([id number]) [phone].pdf Page 3 of 4 Electronic Signature(s) Signed: 05/21/2022 4:59:46 PM By: Deon Pilling RN, BSN Entered By: Deon Pilling on 05/20/2022 08:13:01 -------------------------------------------------------------------------------- Encounter Discharge Information Details Patient Name: Date of Service: Marissa [name]. 05/20/2022 8:15 A M Medical Record Number: [id number] Patient Account Number: [id number] Date of Birth/Sex: Treating RN: 1950/09/23 (72 y.o. [name], Meta.Reding Primary Care Kaiana Marion: [name], [name] Other Clinician: Referring Giovani Neumeister: Treating Thursa Emme/Extender: [name], [name] in Treatment: 19 Encounter Discharge Information Items Discharge Condition: Stable Ambulatory Status: Ambulatory Discharge Destination: Home Transportation: Private Auto Accompanied By: self Schedule Follow-up Appointment: No Clinical Summary of Care: Electronic Signature(s) Signed: 05/21/2022 4:59:46 PM By: Deon Pilling RN, BSN Entered By: Deon Pilling on 05/20/2022 08:12:32 -------------------------------------------------------------------------------- Wound Assessment Details Patient Name: Date of Service: Marissa Perking Cardenas. 05/20/2022 8:15 A M Medical Record Number: [id number] Patient Account Number: [id number] Date of Birth/Sex: Treating RN: 1950-04-05 (72 y.o. [name], Meta.Reding Primary Care Tavius Turgeon: [name], [name] Other Clinician: Referring Izaiah Tabb: Treating Collyn Ribas/Extender: [name], [name] in Treatment: 19 Wound Status Wound Number: 1 Primary Etiology: Pressure Ulcer Wound Location: Sacrum Wound Status: Open Wounding Event: Pressure Injury Date Acquired: 10/09/2021 Weeks Of Treatment: 19 Clustered Wound: No Wound Measurements Length: (cm) 0.4 Width: (cm) 0.4 Depth: (cm) 1.2 Area: (cm) 0.126 Volume: (cm) 0.151 % Reduction in Area: 64.3% % Reduction in Volume: 38.9% Wound Description Classification: Category/Stage IV Exudate Amount: Medium Exudate Type: Purulent Exudate Color: yellow, brown, green Periwound Skin Texture Texture Color No Abnormalities Noted: No No Abnormalities Noted: No Moisture No Abnormalities Noted: No Treatment Notes Wound #1 (Sacrum) [name], [name] ([id number])  [phone] Page 4 of 4 Cleanser Wound Cleanser Discharge Instruction: Cleanse the wound with wound cleanser prior to applying a clean dressing using gauze sponges, not tissue or cotton balls. Peri-Wound Care Skin Prep Discharge Instruction: Use skin prep as directed Topical Primary Dressing Hydrofera Blue Classic Foam, 4x4 in Discharge Instruction: Moisten with saline prior to applying to wound bed Secondary Dressing Zetuvit Plus Silicone Border  Dressing 4x4 (in/in) Discharge Instruction: Apply silicone border over primary dressing as directed. Secured With Compression Wrap Compression Stockings Environmental education officer) Signed: 05/21/2022 4:59:46 PM By: Deon Pilling RN, BSN Entered By: Deon Pilling on 05/20/2022 08:12:12

## 2022-05-22 NOTE — Progress Notes (Signed)
LORIEN, PARSLOW Cardenas ([id number]) [id number] Page 1 of 8 Visit Report for 05/22/2022 Chief Complaint Document Details Patient Name: Date of Service: [name] 05/22/2022 10:15 A M Medical Record Number: [id number] Patient Account Number: [id number] Date of Birth/Sex: Treating RN: 10-20-1950 (72 y.o. F) Primary Care Provider: [name], [name] Other Clinician: Referring Provider: Treating Provider/Extender: Genice Rouge., [name] in Treatment: 20 Information Obtained from: Patient Chief Complaint Pressure ulcer stage 3 Sacrum Electronic Signature(s) Signed: 05/22/2022 11:58:37 AM By: Kalman Shan DO Entered By: Kalman Shan on 05/22/2022 10:04:15 -------------------------------------------------------------------------------- HPI Details Patient Name: Date of Service: Marissa Perking Cardenas. 05/22/2022 10:15 A M Medical Record Number: [id number] Patient Account Number: [id number] Date of Birth/Sex: Treating RN: 17-Sep-1950 (72 y.o. F) Primary Care Provider: [name], [name] Other Clinician: Referring Provider: Treating Provider/Extender: Dalene Seltzer in Treatment: 20 History of Present Illness HPI Description: 01-02-22 upon evaluation today patient presents for initial inspection here in our clinic concerning a wound that she has over the sacral region. This is stated to be present since around the beginning of August 2023. She currently resides in a assisted nursing facility. She does seem to be able to answer questions fully today. Subsequently I do note that she is a little on the thin side but again other than the protein calorie malnutrition she is minimally weak but still does get up and move around some but she also tells me that she "sits a lot". She has not had any x-rays of the sacral region at this point. 01-09-2022 upon evaluation today patient's wound in the sacral area actually appears  to be doing decently well. Fortunately I do not see any signs of infection which is great news and overall I am extremely pleased with where things stand today. 11/8; this is a patient who lives in some form of small assisted living in [place]. She has a deep wound over the lower part of her coccyx. An x-ray that we ordered from last time did not show any osseous abnormalities. We are supposed to be using Hydrofera Blue in the wound but I am really not sure what they are using to dress this and who is doing it. Talking to our staff they have apparently discussed this with the staff in the facility. She does not have home health. 01-23-2022 upon evaluation today patient appears to be doing decently well in regard to her wound. She has been tolerating the dressing changes without complication although I am not certain the dressing changes have been done appropriately over the past couple of weeks. We have been trying to get her to come in here we also try to get her into a wound care center in [place] which would be closer for the assisted living facility. Unfortunately neither 1 of those were undertaken up to this point. The patient did end up having some training of the staff from an RN that they brought him to have the staff perform the dressing changes but that being said it does not sound like this has been done correctly. Nonetheless I do believe that based on what we see it would benefit the patient to actually have her come here 3 times a week also think a wound VAC could potentially be a possibility for her which would help to get things moving in a much better direction as far as healing is concerned. We need to get this to fill-in though it looks clean and does not  look infected I do think that we need to really get this to fill-in and there is quite a bit of undermining that is to be considered here. 01-30-2022 upon evaluation today patient appears to be doing well currently in regard to her  wound which is looking pretty decent but still has quite a bit of space underneath as far as undermining is concerned. I think she might possibly be better with a wound VAC. With that being said if when I do this we probably only be able to do it 2 times a week at most. I discussed that with the patient today she is okay with that we just need to see if we can get the insurance approval and then of course the scheduling underway. 02-13-2022 upon evaluation today patient appears to be doing well currently in regard to her wound. She actually tells me that it is feeling a lot better which is good news. Fortunately I do not see any signs of active infection at this time. No fevers, chills, nausea, vomiting, or diarrhea. 02-27-2022 upon evaluation today patient appears to be doing well currently in regard to her wound. She is actually showing some signs of improvement we are still obtaining the approval for the snap VAC which I think could be beneficial in the meantime we been using the Hydrofera Blue rope. 03-27-2022 upon evaluation today patient appears to be doing okay in regard to her wound but there was no dressing in place upon arrival today. Fortunately I see again no evidence of infection. No fevers, chills, nausea, vomiting, or diarrhea. 04-03-2022 upon evaluation today patient did not have a dressing on when she came into the office. With that being said she actually tells me that it was on until yesterday she took a shower and it came loose and then today came off completely. With that being said I really do not think she needs to be taking shower every single day or having to see her here in the clinic 3 times per week on Monday, Wednesday, and Friday and during those times obviously in between she needs to probably avoid showering. For that reason what I advised her to do would be to shower in the mornings on the days that she comes to see [place] that way if the dressing does come off or start to  come off we will be changing it anyway. She voiced understanding and tells me she can do that. Unfortunately the facility has nobody to help her with changing the dressings and we are not currently having to do that here in the clinic again. This means [name], [name] ([id number]) [id number] Page 2 of 8 that during the time that she was not coming in from December 20 through when I saw her last week on the 17th there was a period of 2 weeks where the wound really was not changed at all as the RN that Myriam Jacobson tell me they had at the facility did not end up working out. This obviously is not good and is not what we expect to see as far as patient care is concerned which is why we are now seeing her 3 times a week here in the clinic. 04-10-2022 upon evaluation today patient appears to be doing well currently in regard to her wound which is actually showing signs of good granulation epithelization at this point. However she does have quite a bit of drainage and we are little concerned about the possibility of infection. For that reason  I think she could benefit from a PCR culture followed by [facility] topical antibiotics. I am also thinking of going ahead and doing gentamicin today as well. 2/7; small wound on the lower sacrum however with considerable degree of undermining from roughly 7-4. PCR culture that was done last week showed "no organisms". We have been using gentamicin and iodoform packing.She lives in some form of group home in [place] I believe. Uncertain about the adequacy of offloading this area 04-24-2022 upon evaluation patient's wound actually is showing signs of doing decently well I do believe she would benefit from a snap VAC and we discussed that again today. Fortunately I do not see any signs of active infection locally nor systemically which is great news. No fevers, chills, nausea, vomiting, or diarrhea. 05-01-2022 upon evaluation today patient's wound is  really doing about the same. Fortunately I do not see any signs of active infection locally nor systemically at this time which is great news with that being said we have gotten approval for the snap VAC and we will going to go ahead and place that today. 05-08-2022 upon evaluation today patient appears to be doing well currently in regard to her wound all things considered but were having a difficult time with a snap VAC suctioning appropriately. With that being said we will get a go ahead and likely avoid the snap VAC at this point at least until we can get the actual brace dressing as bridging ourselves does not seem to be working nearly as well. 3/6; I note the difficulty with a snap VAC which is disappointing but apparently another type of VAC is being considered. Also a referral to plastic surgery at [facility]. The patient has a very small but deep and at the base circumferential undermining. She is coming in here now 3 times a week for [facility] placement 3/13; patient presents for follow-up. She has been using Hydrofera Blue 3 times a week. She comes into our clinic for nurse visits to have this done. We are still awaiting a wound VAC. She has no issues or complaints today. Electronic Signature(s) Signed: 05/22/2022 11:58:37 AM By: Kalman Shan DO Entered By: Kalman Shan on 05/22/2022 10:04:57 -------------------------------------------------------------------------------- Physical Exam Details Patient Name: Date of Service: Marissa [name]. 05/22/2022 10:15 A M Medical Record Number: [id number] Patient Account Number: [id number] Date of Birth/Sex: Treating RN: [birth year] (72 y.o. F) Primary Care Provider: [name], [name] Other Clinician: Referring Provider: Treating Provider/Extender: Dalene Seltzer in Treatment: 20 Constitutional respirations regular, non-labored and within target range for patient.Marland Kitchen Psychiatric pleasant and  cooperative. Notes Open wound to the sacrum with depth but does not probe to bone. Granulation tissue at the opening. No signs of infection. Electronic Signature(s) Signed: 05/22/2022 11:58:37 AM By: Kalman Shan DO Entered By: Kalman Shan on 05/22/2022 11:41:50 -------------------------------------------------------------------------------- Physician Orders Details Patient Name: Date of Service: Marissa [name]. 05/22/2022 10:15 A M Medical Record Number: [id number] Patient Account Number: [id number] Date of Birth/Sex: Treating RN: 07-13-1950 (72 y.o. [name], [name] Primary Care Provider: [name], [name] Other Clinician: Referring Provider: Treating Provider/Extender: Genice Rouge., [name] in Treatment: 20 Verbal / Phone Orders: No Diagnosis Coding ICD-10 Coding Code Description L89.154 Pressure ulcer of sacral region, stage 4 LAKIEA, VERON Cardenas ([id number]) [id number] Page 3 of 8 M62.81 Muscle weakness (generalized) E43 Unspecified severe protein-calorie malnutrition Follow-up Appointments ppointment in 1 week. - w/ [facility] and Bobbi Rm # 8 05/29/22 @ 8:00  one Return A Nurse Visit: - Friday 03/15 @ 10:30 and Monday 05/27/22 @ 8:00 Rm # 5 Other: - Administrator [facility]) : [phone] PCR culture negative-no [employer] ordered. Regular culture negative as of 05/15/22 Anesthetic (In clinic) Topical Lidocaine 5% applied to wound bed Negative Presssure Wound Therapy Black and White Foam combination - medela wound vac- order through wound Q Off-Loading Turn and reposition every 2 hours Wound Treatment Wound #1 - Sacrum Cleanser: Wound Cleanser (Home Health) 2 x Per Week/30 Days Discharge Instructions: Cleanse the wound with wound cleanser prior to applying a clean dressing using gauze sponges, not tissue or cotton balls. Peri-Wound Care: Skin Prep (Home Health) 2 x Per Week/30 Days Discharge Instructions: Use skin  prep as directed Prim Dressing: Hydrofera Blue Classic Foam, 4x4 in 2 x Per Week/30 Days ary Discharge Instructions: Moisten with saline prior to applying to wound bed Secondary Dressing: Zetuvit Plus Silicone Border Dressing 4x4 (in/in) 2 x Per Week/30 Days Discharge Instructions: Apply silicone border over primary dressing as directed. Electronic Signature(s) Signed: 05/22/2022 11:58:37 AM By: Kalman Shan DO Entered By: Kalman Shan on 05/22/2022 11:42:58 -------------------------------------------------------------------------------- Problem List Details Patient Name: Date of Service: Marissa [name]. 05/22/2022 10:15 A M Medical Record Number: [id number] Patient Account Number: [id number] Date of Birth/Sex: Treating RN: [birth year] (72 y.o. F) Primary Care Provider: [name], [name] Other Clinician: Referring Provider: Treating Provider/Extender: Dalene Seltzer in Treatment: 20 Active Problems ICD-10 Encounter Code Description Active Date MDM Diagnosis L89.154 Pressure ulcer of sacral region, stage 4 01/02/2022 No Yes M62.81 Muscle weakness (generalized) 01/02/2022 No Yes E43 Unspecified severe protein-calorie malnutrition 01/02/2022 No Yes Inactive Problems Resolved Problems ROSABEL, WALLENBERG Cardenas ([id number]) [phone].pdf Page 4 of 8 Electronic Signature(s) Signed: 05/22/2022 11:58:37 AM By: Kalman Shan DO Entered By: Kalman Shan on 05/22/2022 10:03:54 -------------------------------------------------------------------------------- Progress Note Details Patient Name: Date of Service: Marissa [name]. 05/22/2022 10:15 A M Medical Record Number: [id number] Patient Account Number: [id number] Date of Birth/Sex: Treating RN: 04-20-1950 (72 y.o. F) Primary Care Provider: [name], [name] Other Clinician: Referring Provider: Treating Provider/Extender: Dalene Seltzer in  Treatment: 20 Subjective Chief Complaint Information obtained from Patient Pressure ulcer stage 3 Sacrum History of Present Illness (HPI) 01-02-22 upon evaluation today patient presents for initial inspection here in our clinic concerning a wound that she has over the sacral region. This is stated to be present since around the beginning of August 2023. She currently resides in a assisted nursing facility. She does seem to be able to answer questions fully today. Subsequently I do note that she is a little on the thin side but again other than the protein calorie malnutrition she is minimally weak but still does get up and move around some but she also tells me that she "sits a lot". She has not had any x-rays of the sacral region at this point. 01-09-2022 upon evaluation today patient's wound in the sacral area actually appears to be doing decently well. Fortunately I do not see any signs of infection which is great news and overall I am extremely pleased with where things stand today. 11/8; this is a patient who lives in some form of small assisted living in [place]. She has a deep wound over the lower part of her coccyx. An x-ray that we ordered from last time did not show any osseous abnormalities. We are supposed to be using Hydrofera Blue in the wound but I am really not sure  what they are using to dress this and who is doing it. Talking to our staff they have apparently discussed this with the staff in the facility. She does not have home health. 01-23-2022 upon evaluation today patient appears to be doing decently well in regard to her wound. She has been tolerating the dressing changes without complication although I am not certain the dressing changes have been done appropriately over the past couple of weeks. We have been trying to get her to come in here we also try to get her into a wound care center in [place] which would be closer for the assisted living facility. Unfortunately  neither 1 of those were undertaken up to this point. The patient did end up having some training of the staff from an RN that they brought him to have the staff perform the dressing changes but that being said it does not sound like this has been done correctly. Nonetheless I do believe that based on what we see it would benefit the patient to actually have her come here 3 times a week also think a wound VAC could potentially be a possibility for her which would help to get things moving in a much better direction as far as healing is concerned. We need to get this to fill-in though it looks clean and does not look infected I do think that we need to really get this to fill-in and there is quite a bit of undermining that is to be considered here. 01-30-2022 upon evaluation today patient appears to be doing well currently in regard to her wound which is looking pretty decent but still has quite a bit of space underneath as far as undermining is concerned. I think she might possibly be better with a wound VAC. With that being said if when I do this we probably only be able to do it 2 times a week at most. I discussed that with the patient today she is okay with that we just need to see if we can get the insurance approval and then of course the scheduling underway. 02-13-2022 upon evaluation today patient appears to be doing well currently in regard to her wound. She actually tells me that it is feeling a lot better which is good news. Fortunately I do not see any signs of active infection at this time. No fevers, chills, nausea, vomiting, or diarrhea. 02-27-2022 upon evaluation today patient appears to be doing well currently in regard to her wound. She is actually showing some signs of improvement we are still obtaining the approval for the snap VAC which I think could be beneficial in the meantime we been using the Hydrofera Blue rope. 03-27-2022 upon evaluation today patient appears to be doing okay in  regard to her wound but there was no dressing in place upon arrival today. Fortunately I see again no evidence of infection. No fevers, chills, nausea, vomiting, or diarrhea. 04-03-2022 upon evaluation today patient did not have a dressing on when she came into the office. With that being said she actually tells me that it was on until yesterday she took a shower and it came loose and then today came off completely. With that being said I really do not think she needs to be taking shower every single day or having to see her here in the clinic 3 times per week on Monday, Wednesday, and Friday and during those times obviously in between she needs to probably avoid showering. For that reason what I  advised her to do would be to shower in the mornings on the days that she comes to see [place] that way if the dressing does come off or start to come off we will be changing it anyway. She voiced understanding and tells me she can do that. Unfortunately the facility has nobody to help her with changing the dressings and we are not currently having to do that here in the clinic again. This means that during the time that she was not coming in from December 20 through when I saw her last week on the 17th there was a period of 2 weeks where the wound really was not changed at all as the RN that Myriam Jacobson tell me they had at the facility did not end up working out. This obviously is not good and is not what we expect to see as far as patient care is concerned which is why we are now seeing her 3 times a week here in the clinic. 04-10-2022 upon evaluation today patient appears to be doing well currently in regard to her wound which is actually showing signs of good granulation epithelization at this point. However she does have quite a bit of drainage and we are little concerned about the possibility of infection. For that reason I think she could benefit from a PCR culture followed by [facility] topical antibiotics. I am also  thinking of going ahead and doing gentamicin today as well. 2/7; small wound on the lower sacrum however with considerable degree of undermining from roughly 7-4. PCR culture that was done last week showed "no organisms". We have been using gentamicin and iodoform packing.She lives in some form of group home in [place] I believe. Uncertain about the adequacy of offloading this area 04-24-2022 upon evaluation patient's wound actually is showing signs of doing decently well I do believe she would benefit from a snap VAC and we discussed that again today. Fortunately I do not see any signs of active infection locally nor systemically which is great news. No fevers, chills, nausea, vomiting, or diarrhea. 05-01-2022 upon evaluation today patient's wound is really doing about the same. Fortunately I do not see any signs of active infection locally nor systemically at this time which is great news with that being said we have gotten approval for the snap VAC and we will going to go ahead and place that today. 05-08-2022 upon evaluation today patient appears to be doing well currently in regard to her wound all things considered but were having a difficult time with a snap VAC suctioning appropriately. With that being said we will get a go ahead and likely avoid the snap VAC at this point at least until we can get the actual [name], [name] ([id number]) [id number] Page 5 of 8 brace dressing as bridging ourselves does not seem to be working nearly as well. 3/6; I note the difficulty with a snap VAC which is disappointing but apparently another type of VAC is being considered. Also a referral to plastic surgery at [facility]. The patient has a very small but deep and at the base circumferential undermining. She is coming in here now 3 times a week for [facility] placement 3/13; patient presents for follow-up. She has been using Hydrofera Blue 3 times a week. She comes into our  clinic for nurse visits to have this done. We are still awaiting a wound VAC. She has no issues or complaints today. Patient History Information obtained from Patient. Family History Cancer - Father,  Heart Disease - Father, Seizures - Siblings, No family history of Diabetes, Hereditary Spherocytosis, Hypertension, Kidney Disease, Lung Disease, Stroke, Thyroid Problems, Tuberculosis. Social History Current some day smoker, Marital Status - Widowed, Alcohol Use - Never, Drug Use - No History, Caffeine Use - Daily. Medical History Eyes Denies history of Cataracts, Glaucoma, Optic Neuritis Ear/Nose/Mouth/Throat Denies history of Chronic sinus problems/congestion, Middle ear problems Hematologic/Lymphatic Denies history of Anemia, Hemophilia, Human Immunodeficiency Virus, Lymphedema, Sickle Cell Disease Respiratory Denies history of Aspiration, Asthma, Chronic Obstructive Pulmonary Disease (COPD), Pneumothorax, Sleep Apnea, Tuberculosis Cardiovascular Denies history of Angina, Arrhythmia, Congestive Heart Failure, Coronary Artery Disease, Deep Vein Thrombosis, Hypertension, Hypotension, Myocardial Infarction, Peripheral Arterial Disease, Peripheral Venous Disease, Phlebitis, Vasculitis Gastrointestinal Patient has history of Colitis Denies history of Cirrhosis , Crohnoos, Hepatitis A, Hepatitis B, Hepatitis C Genitourinary Denies history of End Stage Renal Disease Immunological Denies history of Lupus Erythematosus, Raynaudoos, Scleroderma Integumentary (Skin) Denies history of History of Burn Musculoskeletal Denies history of Gout, Rheumatoid Arthritis, Osteoarthritis, Osteomyelitis Neurologic Denies history of Dementia, Neuropathy, Quadriplegia, Paraplegia, Seizure Disorder Oncologic Denies history of Received Chemotherapy, Received Radiation Psychiatric Denies history of Anorexia/bulimia, Confinement Anxiety Hospitalization/Surgery History - hysterotomy. - cytis removed from  breast. Objective Constitutional respirations regular, non-labored and within target range for patient.. Vitals Time Taken: 9:37 AM, Height: 64 in, Weight: 97 lbs, BMI: 16.6, Temperature: 98.4 F, Pulse: 72 bpm, Respiratory Rate: 20 breaths/min, Blood Pressure: 106/69 mmHg. Psychiatric pleasant and cooperative. General Notes: Open wound to the sacrum with depth but does not probe to bone. Granulation tissue at the opening. No signs of infection. Integumentary (Hair, Skin) Wound #1 status is Open. Original cause of wound was Pressure Injury. The date acquired was: 10/09/2021. The wound has been in treatment 20 weeks. The wound is located on the Sacrum. The wound measures 0.4cm length x 0.4cm width x 1cm depth; 0.126cm^2 area and 0.126cm^3 volume. There is a medium amount of purulent drainage noted. Assessment Active Problems ICD-10 [name], [name] ([id number]) [id number] Page 6 of 8 Pressure ulcer of sacral region, stage 4 Muscle weakness (generalized) Unspecified severe protein-calorie malnutrition Patient's wound is stable. I recommended continued course with Hydrofera Blue. She follows in our clinic for nurse visits to have this change. Continue aggressive offloading. Follow-up for provider visit in 1 week. Plan Follow-up Appointments: Return Appointment in 1 week. - [facility] [facility] and Bobbi Rm # 8 05/29/22 @ 8:00 one Nurse Visit: - Friday 03/15 @ 10:30 and Monday 05/27/22 @ 8:00 Rm # 5 Other: - Administrator Arbutus Ped) : [phone] PCR culture negative-no [employer] ordered. Regular culture negative as of 05/15/22 Anesthetic: (In clinic) Topical Lidocaine 5% applied to wound bed Negative Presssure Wound Therapy: Black and White Foam combination - medela wound vac- order through wound Q Off-Loading: Turn and reposition every 2 hours WOUND #1: - Sacrum Wound Laterality: Cleanser: Wound Cleanser (Home Health) 2 x Per Week/30 Days Discharge Instructions:  Cleanse the wound with wound cleanser prior to applying a clean dressing using gauze sponges, not tissue or cotton balls. Peri-Wound Care: Skin Prep (Home Health) 2 x Per Week/30 Days Discharge Instructions: Use skin prep as directed Prim Dressing: Hydrofera Blue Classic Foam, 4x4 in 2 x Per Week/30 Days ary Discharge Instructions: Moisten with saline prior to applying to wound bed Secondary Dressing: Zetuvit Plus Silicone Border Dressing 4x4 (in/in) 2 x Per Week/30 Days Discharge Instructions: Apply silicone border over primary dressing as directed. 1. Hydrofera Blue 2. Aggressive offloading 3. Follow-up in 1 week 4. Continue nurse  visits Electronic Signature(s) Signed: 05/22/2022 11:58:37 AM By: Kalman Shan DO Entered By: Kalman Shan on 05/22/2022 11:44:32 -------------------------------------------------------------------------------- HxROS Details Patient Name: Date of Service: [name], Marissa Pew Cardenas. 05/22/2022 10:15 A M Medical Record Number: [id number] Patient Account Number: [id number] Date of Birth/Sex: Treating RN: [birth year] (72 y.o. F) Primary Care Provider: [name], [name] Other Clinician: Referring Provider: Treating Provider/Extender: Dalene Seltzer in Treatment: 20 Information Obtained From Patient Eyes Medical History: Negative for: Cataracts; Glaucoma; Optic Neuritis Ear/Nose/Mouth/Throat Medical History: Negative for: Chronic sinus problems/congestion; Middle ear problems Hematologic/Lymphatic Medical History: Negative for: Anemia; Hemophilia; Human Immunodeficiency Virus; Lymphedema; Sickle Cell Disease Respiratory Medical History: Negative for: Aspiration; Asthma; Chronic Obstructive Pulmonary Disease (COPD); Pneumothorax; Sleep Apnea; Tuberculosis [name], [name] ([id number]) [id number] Page 7 of 8 Cardiovascular Medical History: Negative for: Angina; Arrhythmia; Congestive Heart Failure;  Coronary Artery Disease; Deep Vein Thrombosis; Hypertension; Hypotension; Myocardial Infarction; Peripheral Arterial Disease; Peripheral Venous Disease; Phlebitis; Vasculitis Gastrointestinal Medical History: Positive for: Colitis Negative for: Cirrhosis ; Crohns; Hepatitis A; Hepatitis B; Hepatitis C Genitourinary Medical History: Negative for: End Stage Renal Disease Immunological Medical History: Negative for: Lupus Erythematosus; Raynauds; Scleroderma Integumentary (Skin) Medical History: Negative for: History of Burn Musculoskeletal Medical History: Negative for: Gout; Rheumatoid Arthritis; Osteoarthritis; Osteomyelitis Neurologic Medical History: Negative for: Dementia; Neuropathy; Quadriplegia; Paraplegia; Seizure Disorder Oncologic Medical History: Negative for: Received Chemotherapy; Received Radiation Psychiatric Medical History: Negative for: Anorexia/bulimia; Confinement Anxiety Immunizations Pneumococcal Vaccine: Received Pneumococcal Vaccination: No Implantable Devices None Hospitalization / Surgery History Type of Hospitalization/Surgery hysterotomy cytis removed from breast Family and Social History Cancer: Yes - Father; Diabetes: No; Heart Disease: Yes - Father; Hereditary Spherocytosis: No; Hypertension: No; Kidney Disease: No; Lung Disease: No; Seizures: Yes - Siblings; Stroke: No; Thyroid Problems: No; Tuberculosis: No; Current some day smoker; Marital Status - Widowed; Alcohol Use: Never; Drug Use: No History; Caffeine Use: Daily; Financial Concerns: No; Food, Clothing or Shelter Needs: No; Support System Lacking: No; Transportation Concerns: No Electronic Signature(s) Signed: 05/22/2022 11:58:37 AM By: Kalman Shan DO Entered By: Kalman Shan on 05/22/2022 10:05:47 -------------------------------------------------------------------------------- SuperBill Details Patient Name: Date of Service: [name] 05/22/2022 [name]  ([id number]) [phone].pdf Page 8 of 8 Medical Record Number: [id number] Patient Account Number: [id number] Date of Birth/Sex: Treating RN: October 29, 1950 (72 y.o. [name], [name] Primary Care Provider: [name], [name] Other Clinician: Referring Provider: Treating Provider/Extender: Genice Rouge., [name] in Treatment: 20 Diagnosis Coding ICD-10 Codes Code Description [phone] Pressure ulcer of sacral region, stage 4 M62.81 Muscle weakness (generalized) E43 Unspecified severe protein-calorie malnutrition Facility Procedures : CPT4 Code: [id number] Description: [id number] - WOUND CARE VISIT-LEV 3 EST PT Modifier: Quantity: 1 Physician Procedures : CPT4 Code Description Modifier [id number] - WC PHYS LEVEL 3 - EST PT ICD-10 Diagnosis Description L89.154 Pressure ulcer of sacral region, stage 4 M62.81 Muscle weakness (generalized) E43 Unspecified severe protein-calorie malnutrition Quantity: 1 Electronic Signature(s) Signed: 05/22/2022 11:58:37 AM By: Kalman Shan DO Entered By: Kalman Shan on 05/22/2022 11:44:44

## 2022-05-22 NOTE — Progress Notes (Signed)
SAORI, YOUN R ([id number]) [id number] Page 1 of 1 Visit Report for 05/17/2022 SuperBill Details Patient Name: Date of Service: [name] 05/17/2022 Medical Record Number: [id number] Patient Account Number: [id number] Date of Birth/Sex: Treating RN: 1950-11-10 (72 y.o. [name], [name] Primary Care Provider: [name], [name] Other Clinician: Referring Provider: Treating Provider/Extender: Dalene Seltzer in Treatment: 19 Diagnosis Coding ICD-10 Codes Code Description [phone] Pressure ulcer of sacral region, stage 4 M62.81 Muscle weakness (generalized) E43 Unspecified severe protein-calorie malnutrition Facility Procedures CPT4 Code Description Modifier Quantity [id number] [phone] - WOUND CARE VISIT-LEV 2 EST PT 1 Electronic Signature(s) Signed: 05/17/2022 3:03:33 PM By: Deon Pilling RN, BSN Signed: 05/21/2022 3:47:22 PM By: Kalman Shan DO Entered By: Deon Pilling on 05/17/2022 12:40:48

## 2022-05-23 DIAGNOSIS — Z79899 Other long term (current) drug therapy: Secondary | ICD-10-CM | POA: Diagnosis not present

## 2022-05-23 DIAGNOSIS — L89154 Pressure ulcer of sacral region, stage 4: Secondary | ICD-10-CM | POA: Diagnosis not present

## 2022-05-23 NOTE — Progress Notes (Signed)
RAECHEL, DUNKLEBERGER R ([id number]) [id number] Page 1 of 7 Visit Report for 05/22/2022 Arrival Information Details Patient Name: Date of Service: [name] 05/22/2022 10:15 A M Medical Record Number: [id number] Patient Account Number: [id number] Date of Birth/Sex: Treating RN: 02/06/1951 (72 y.o. F) Primary Care Kama Cammarano: [name], [name] Other Clinician: Referring Clifford Benninger: Treating Kaspar Albornoz/Extender: Dalene Seltzer in Treatment: 20 Visit Information History Since Last Visit All ordered tests and consults were completed: No Patient Arrived: Ambulatory Added or deleted any medications: No Arrival Time: 09:36 Any new allergies or adverse reactions: No Accompanied By: self Had a fall or experienced change in No Transfer Assistance: None activities of daily living that may affect Patient Identification Verified: Yes risk of falls: Secondary Verification Process Completed: Yes Signs or symptoms of abuse/neglect since last visito No Patient Requires Transmission-Based Precautions: No Hospitalized since last visit: No Patient Has Alerts: No Implantable device outside of the clinic excluding No cellular tissue based products placed in the center since last visit: Pain Present Now: No Electronic Signature(s) Signed: 05/22/2022 1:30:17 PM By: Worthy Rancher Entered By: Worthy Rancher on 05/22/2022 09:37:13 -------------------------------------------------------------------------------- Clinic Level of Care Assessment Details Patient Name: Date of Service: [name] 05/22/2022 10:15 A M Medical Record Number: [id number] Patient Account Number: [id number] Date of Birth/Sex: Treating RN: 1950/03/17 (72 y.o. [name], [name] Primary Care Destiney Sanabia: [name], [name] Other Clinician: Referring Tobi Groesbeck: Treating Brooklee Michelin/Extender: Genice Rouge., [name] in Treatment: 20 Clinic Level of Care Assessment  Items TOOL 4 Quantity Score X- 1 0 Use when only an EandM is performed on FOLLOW-UP visit ASSESSMENTS - Nursing Assessment / Reassessment X- 1 10 Reassessment of Marissa-morbidities (includes updates in patient status) X- 1 5 Reassessment of Adherence to Treatment Plan ASSESSMENTS - Wound and Skin A ssessment / Reassessment X - Simple Wound Assessment / Reassessment - one wound 1 5 '[]'$  - 0 Complex Wound Assessment / Reassessment - multiple wounds '[]'$  - 0 Dermatologic / Skin Assessment (not related to wound area) ASSESSMENTS - Focused Assessment '[]'$  - 0 Circumferential Edema Measurements - multi extremities '[]'$  - 0 Nutritional Assessment / Counseling / Intervention '[]'$  - 0 Lower Extremity Assessment (monofilament, tuning fork, pulses) '[]'$  - 0 Peripheral Arterial Disease Assessment (using hand held doppler) ASSESSMENTS - Ostomy and/or Continence Assessment and Care '[]'$  - 0 Incontinence Assessment and Management '[]'$  - 0 Ostomy Care Assessment and Management (repouching, etc.) PROCESS - Coordination of Care X - Simple Patient / Family Education for ongoing care 1 15 [place], Loving R ([id number]) [phone].pdf Page 2 of 7 '[]'$  - 0 Complex (extensive) Patient / Family Education for ongoing care X- 1 10 Staff obtains Programmer, systems, Records, T Results / Process Orders est '[]'$  - 0 Staff telephones HHA, Nursing Homes / Clarify orders / etc '[]'$  - 0 Routine Transfer to another Facility (non-emergent condition) '[]'$  - 0 Routine Hospital Admission (non-emergent condition) '[]'$  - 0 New Admissions / Biomedical engineer / Ordering NPWT Apligraf, etc. , '[]'$  - 0 Emergency Hospital Admission (emergent condition) X- 1 10 Simple Discharge Coordination '[]'$  - 0 Complex (extensive) Discharge Coordination PROCESS - Special Needs '[]'$  - 0 Pediatric / Minor Patient Management '[]'$  - 0 Isolation Patient Management '[]'$  - 0 Hearing / Language / Visual special needs '[]'$  - 0 Assessment of  Community assistance (transportation, D/C planning, etc.) '[]'$  - 0 Additional assistance / Altered mentation '[]'$  - 0 Support Surface(s) Assessment (bed, cushion, seat, etc.) INTERVENTIONS - Wound Cleansing / Measurement  X - Simple Wound Cleansing - one wound 1 5 '[]'$  - 0 Complex Wound Cleansing - multiple wounds X- 1 5 Wound Imaging (photographs - any number of wounds) '[]'$  - 0 Wound Tracing (instead of photographs) X- 1 5 Simple Wound Measurement - one wound '[]'$  - 0 Complex Wound Measurement - multiple wounds INTERVENTIONS - Wound Dressings X - Small Wound Dressing one or multiple wounds 1 10 '[]'$  - 0 Medium Wound Dressing one or multiple wounds '[]'$  - 0 Large Wound Dressing one or multiple wounds X- 1 5 Application of Medications - topical '[]'$  - 0 Application of Medications - injection INTERVENTIONS - Miscellaneous '[]'$  - 0 External ear exam '[]'$  - 0 Specimen Collection (cultures, biopsies, blood, body fluids, etc.) '[]'$  - 0 Specimen(s) / Culture(s) sent or taken to Lab for analysis '[]'$  - 0 Patient Transfer (multiple staff / Civil Service fast streamer / Similar devices) '[]'$  - 0 Simple Staple / Suture removal (25 or less) '[]'$  - 0 Complex Staple / Suture removal (26 or more) '[]'$  - 0 Hypo / Hyperglycemic Management (close monitor of Blood Glucose) '[]'$  - 0 Ankle / Brachial Index (ABI) - do not check if billed separately X- 1 5 Vital Signs Has the patient been seen at the hospital within the last three years: Yes Total Score: 90 Level Of Care: New/Established - Level 3 Electronic Signature(s) Signed: 05/22/2022 4:00:04 PM By: [name] Entered By: [name] on 05/22/2022 10:33:22 [name] ([id number] Page 3 of 7 -------------------------------------------------------------------------------- Encounter Discharge Information Details Patient Name: Date of Service: [name] 05/22/2022 10:15 A M Medical Record Number:  [id number] Patient Account Number: [id number] Date of Birth/Sex: Treating RN: 09-Jul-1950 (71 y.o. [name], [name] Primary Care Arnetta Odeh: [name], [name] Other Clinician: Referring Annaliz Aven: Treating Demiana Crumbley/Extender: Genice Rouge., [name] in Treatment: 20 Encounter Discharge Information Items Discharge Condition: Stable Ambulatory Status: Ambulatory Discharge Destination: Home Transportation: Private Auto Accompanied By: self Schedule Follow-up Appointment: Yes Clinical Summary of Care: Patient Declined Electronic Signature(s) Signed: 05/22/2022 4:00:04 PM By: [name] Entered By: [name] on 05/22/2022 10:34:01 -------------------------------------------------------------------------------- Lower Extremity Assessment Details Patient Name: Date of Service: Marissa [name] 05/22/2022 10:15 A M Medical Record Number: [id number] Patient Account Number: [id number] Date of Birth/Sex: Treating RN: 1950-03-30 (72 y.o. [name], [name] Primary Care Millie Shorb: [name], [name] Other Clinician: Referring Parris Signer: Treating Tayvion Lauder/Extender: Dalene Seltzer in Treatment: 20 Electronic Signature(s) Signed: 05/22/2022 4:00:04 PM By: [name] Entered By: [name] on 05/22/2022 09:57:52 -------------------------------------------------------------------------------- Multi Wound Chart Details Patient Name: Date of Service: Marissa Perking R. 05/22/2022 10:15 A M Medical Record Number: [id number] Patient Account Number: [id number] Date of Birth/Sex: Treating RN: 04-23-1950 (72 y.o. F) Primary Care Kasidy Gianino: [name], [name] Other Clinician: Referring Latrisa Hellums: Treating Yordi Krager/Extender: Dalene Seltzer in Treatment: 20 Vital Signs Height(in): 64 Pulse(bpm): 72 Weight(lbs): 97 Blood Pressure(mmHg): 106/69 Body Mass Index(BMI): 16.6 Temperature(F):  98.4 Respiratory Rate(breaths/min): 20 [1:Photos:] [N/A:N/A] Marissa, RODBERG R ([id number]) [1:Sacrum Wound Location: Pressure Injury Wounding Event: Pressure Ulcer Primary Etiology: Colitis Comorbid History: 10/09/2021 Date Acquired: 20 Weeks of Treatment: Open Wound Status: No Wound Recurrence: 0.4x0.4x1 Measurements L x W x D (cm) 0.126 A  (cm) : rea 0.126 Volume (cm) : 64.30% % Reduction in A rea: 49.00% % Reduction in Volume: Category/Stage IV Classification: Medium Exudate A mount: Purulent Exudate Type: yellow, brown, green Exudate Color:] [N/A:N/A N/A N/A N/A N/A N/A  N/A N/A N/A N/A  N/A N/A N/A N/A N/A N/A N/A] Treatment Notes Electronic Signature(s) Signed: 05/22/2022 11:58:37 AM By: Kalman Shan DO Entered By: Kalman Shan on 05/22/2022 10:03:59 -------------------------------------------------------------------------------- [place] Details Patient Name: Date of Service: Marissa [name] 05/22/2022 10:15 A M Medical Record Number: [id number] Patient Account Number: [id number] Date of Birth/Sex: Treating RN: [birth year] (72 y.o. [name], [name] Primary Care Lynda Capistran: [name], [name] Other Clinician: Referring Kendryck Lacroix: Treating Shajuan Musso/Extender: Genice Rouge., [name] in Treatment: 20 Active Inactive Pain, Acute or Chronic Nursing Diagnoses: Pain, acute or chronic: actual or potential Potential alteration in comfort, pain Goals: Patient will verbalize adequate pain control and receive pain control interventions during procedures as needed Date Initiated: 01/02/2022 Target Resolution Date: 06/08/2022 Goal Status: Active Patient/caregiver will verbalize adequate pain control between visits Date Initiated: 01/02/2022 Target Resolution Date: 06/08/2022 Goal Status: Active Interventions: Encourage patient to take pain medications as prescribed Provide education on pain management Reposition patient for  comfort Treatment Activities: Administer pain control measures as ordered : 01/02/2022 Notes: Pressure Nursing Diagnoses: Knowledge deficit related to management of pressures ulcers Goals: Patient/caregiver will verbalize risk factors for pressure ulcer development Date Initiated: 01/02/2022 Target Resolution Date: 06/08/2022 Goal Status: Active Interventions: Assess: immobility, friction, shearing, incontinence upon admission and as needed Marissa, Cardenas ([id number]) [phone].pdf Page 5 of 7 Assess offloading mechanisms upon admission and as needed Provide education on pressure ulcers Treatment Activities: T ordered outside of clinic : 01/02/2022 est Notes: Electronic Signature(s) Signed: 05/22/2022 4:00:04 PM By: [name] Entered By: [name] on 05/22/2022 10:14:00 -------------------------------------------------------------------------------- Pain Assessment Details Patient Name: Date of Service: Marissa [name] 05/22/2022 10:15 A M Medical Record Number: [id number] Patient Account Number: [id number] Date of Birth/Sex: Treating RN: 02/24/1951 (72 y.o. F) Primary Care Carolanne Mercier: [name], [name] Other Clinician: Referring Finnlee Guarnieri: Treating Kischa Altice/Extender: Dalene Seltzer in Treatment: 20 Active Problems Location of Pain Severity and Description of Pain Patient Has Paino Yes Site Locations Rate the pain. Current Pain Level: 4 Worst Pain Level: 10 Least Pain Level: 0 Tolerable Pain Level: 2 Pain Management and Medication Current Pain Management: Electronic Signature(s) Signed: 05/22/2022 1:30:17 PM By: Worthy Rancher Entered By: Worthy Rancher on 05/22/2022 09:37:50 -------------------------------------------------------------------------------- Patient/Caregiver Education Details Patient Name: Date of Service: Marissa [name] 3/13/2024andnbsp10:15 [place] Record Number:  [id number] Patient Account Number: [id number] Date of Birth/Gender: Treating RN: January 08, 1951 (72 y.o. [name], [name] Primary Care Physician: [name], [name] Other Clinician: Referring Physician: Treating Physician/Extender: Dalene Seltzer in Treatment: 20 Education Assessment Education Provided To: Patient Marissa, Cardenas ([id number]) [id number] Page 6 of 7 Education Topics Provided Wound/Skin Impairment: Methods: Explain/Verbal Responses: Reinforcements needed, State content correctly Electronic Signature(s) Signed: 05/22/2022 4:00:04 PM By: [name] Entered By: [name] on 05/22/2022 10:14:12 -------------------------------------------------------------------------------- Wound Assessment Details Patient Name: Date of Service: Marissa [name] 05/22/2022 10:15 A M Medical Record Number: [id number] Patient Account Number: [id number] Date of Birth/Sex: Treating RN: 08-09-1950 (71 y.o. F) Primary Care Arrington Yohe: [name], [name] Other Clinician: Referring Aradhya Shellenbarger: Treating Mandi Mattioli/Extender: Dalene Seltzer in Treatment: 20 Wound Status Wound Number: 1 Primary Etiology: Pressure Ulcer Wound Location: Sacrum Wound Status: Open Wounding Event: Pressure Injury Comorbid History: Colitis Date Acquired: 10/09/2021 Weeks Of Treatment: 20 Clustered Wound: No Photos Wound Measurements Length: (cm) 0.4 Width: (cm) 0.4 Depth: (cm) 1 Area: (cm) 0.126 Volume: (cm) 0.126 %  Reduction in Area: 64.3% % Reduction in Volume: 49% Wound Description Classification: Category/Stage IV Exudate Amount: Medium Exudate Type: Purulent Exudate Color: yellow, brown, green Periwound Skin Texture Texture Color No Abnormalities Noted: No No Abnormalities Noted: No Moisture No Abnormalities Noted: No Treatment Notes Wound #1 (Sacrum) Cleanser Wound Cleanser Discharge Instruction:  Cleanse the wound with wound cleanser prior to applying a clean dressing using gauze sponges, not tissue or cotton balls. Peri-Wound Care ZIYONNA, NOACK R ([id number]) [id number] Page 7 of 7 Skin Prep Discharge Instruction: Use skin prep as directed Topical Primary Dressing Hydrofera Blue Classic Foam, 4x4 in Discharge Instruction: Moisten with saline prior to applying to wound bed Secondary Dressing Zetuvit Plus Silicone Border Dressing 4x4 (in/in) Discharge Instruction: Apply silicone border over primary dressing as directed. Secured With Compression Wrap Compression Stockings Environmental education officer) Signed: 05/22/2022 1:30:17 PM By: Worthy Rancher Entered By: Worthy Rancher on 05/22/2022 09:49:33 -------------------------------------------------------------------------------- Vitals Details Patient Name: Date of Service: Marissa CKRELL, [name] 05/22/2022 10:15 A M Medical Record Number: [id number] Patient Account Number: [id number] Date of Birth/Sex: Treating RN: [birth year] (72 y.o. F) Primary Care Deaja Rizo: [name], [name] Other Clinician: Referring America Sandall: Treating Patina Spanier/Extender: Dalene Seltzer in Treatment: 20 Vital Signs Time Taken: 09:37 Temperature (F): 98.4 Height (in): 64 Pulse (bpm): 72 Weight (lbs): 97 Respiratory Rate (breaths/min): 20 Body Mass Index (BMI): 16.6 Blood Pressure (mmHg): 106/69 Reference Range: 80 - 120 mg / dl Electronic Signature(s) Signed: 05/22/2022 1:30:17 PM By: Worthy Rancher Entered By: Worthy Rancher on 05/22/2022 09:37:36

## 2022-05-24 ENCOUNTER — Encounter (HOSPITAL_BASED_OUTPATIENT_CLINIC_OR_DEPARTMENT_OTHER): Payer: 59 | Admitting: Internal Medicine

## 2022-05-24 DIAGNOSIS — E43 Unspecified severe protein-calorie malnutrition: Secondary | ICD-10-CM | POA: Diagnosis not present

## 2022-05-24 DIAGNOSIS — L89153 Pressure ulcer of sacral region, stage 3: Secondary | ICD-10-CM | POA: Diagnosis not present

## 2022-05-24 DIAGNOSIS — L89154 Pressure ulcer of sacral region, stage 4: Secondary | ICD-10-CM | POA: Diagnosis not present

## 2022-05-24 NOTE — Progress Notes (Signed)
PIEDAD, HANSMAN R ([id number]) [id number] Page 1 of 1 Visit Report for 05/24/2022 SuperBill Details Patient Name: Date of Service: [name] 05/24/2022 Medical Record Number: [id number] Patient Account Number: [id number] Date of Birth/Sex: Treating RN: 1950-04-15 (72 y.o. [name], [name] Primary Care Provider: [name], [name] Other Clinician: Referring Provider: Treating Provider/Extender: Genice Rouge., [name] in Treatment: 20 Diagnosis Coding ICD-10 Codes Code Description [phone] Pressure ulcer of sacral region, stage 4 M62.81 Muscle weakness (generalized) E43 Unspecified severe protein-calorie malnutrition Facility Procedures CPT4 Code Description Modifier Quantity [id number] - WOUND CARE VISIT-LEV 2 EST PT 1 Electronic Signature(s) Signed: 05/24/2022 10:47:35 AM By: [name] Signed: 05/24/2022 12:33:12 PM By: Kalman Shan DO Entered By: [name] on 05/24/2022 10:36:29

## 2022-05-24 NOTE — Progress Notes (Signed)
SEBA, TSCHOEPE Cardenas ([id number]) [id number] Page 1 of 5 Visit Report for 05/24/2022 Arrival Information Details Patient Name: Date of Service: [name]. 05/24/2022 10:30 A M Medical Record Number: [id number] Patient Account Number: [id number] Date of Birth/Sex: Treating RN: 1950/07/19 (72 y.o. [name], [name] Primary Care Qualyn Oyervides: [name], [name] Other Clinician: Referring Lovelle Lema: Treating Shamir Tuzzolino/Extender: Genice Rouge., [name] in Treatment: 20 Visit Information History Since Last Visit Added or deleted any medications: No Patient Arrived: Ambulatory Any new allergies or adverse reactions: No Arrival Time: 10:30 Had a fall or experienced change in No Accompanied By: self activities of daily living that may affect Transfer Assistance: None risk of falls: Patient Identification Verified: Yes Signs or symptoms of abuse/neglect since last visito No Secondary Verification Process Completed: Yes Hospitalized since last visit: No Patient Requires Transmission-Based Precautions: No Implantable device outside of the clinic excluding No Patient Has Alerts: No cellular tissue based products placed in the center since last visit: Has Dressing in Place as Prescribed: Yes Pain Present Now: No Electronic Signature(s) Signed: 05/24/2022 10:47:35 AM By: [name] Entered By: [name] on 05/24/2022 10:31:13 -------------------------------------------------------------------------------- Clinic Level of Care Assessment Details Patient Name: Date of Service: [name]. 05/24/2022 10:30 A M Medical Record Number: [id number] Patient Account Number: [id number] Date of Birth/Sex: Treating RN: 11-Nov-1950 (72 y.o. [name], [name] Primary Care Ritisha Deitrick: [name], [name] Other Clinician: Referring Jarl Sellitto: Treating Sherle Mello/Extender: Genice Rouge., [name] in Treatment: 20 Clinic  Level of Care Assessment Items TOOL 4 Quantity Score X- 1 0 Use when only an EandM is performed on FOLLOW-UP visit ASSESSMENTS - Nursing Assessment / Reassessment X- 1 10 Reassessment of Marissa-morbidities (includes updates in patient status) X- 1 5 Reassessment of Adherence to Treatment Plan ASSESSMENTS - Wound and Skin A ssessment / Reassessment X - Simple Wound Assessment / Reassessment - one wound 1 5 []  - 0 Complex Wound Assessment / Reassessment - multiple wounds []  - 0 Dermatologic / Skin Assessment (not related to wound area) ASSESSMENTS - Focused Assessment []  - 0 Circumferential Edema Measurements - multi extremities []  - 0 Nutritional Assessment / Counseling / Intervention []  - 0 Lower Extremity Assessment (monofilament, tuning fork, pulses) []  - 0 Peripheral Arterial Disease Assessment (using hand held doppler) ASSESSMENTS - Ostomy and/or Continence Assessment and Care []  - 0 Incontinence Assessment and Management []  - 0 Ostomy Care Assessment and Management (repouching, etc.) PROCESS - Coordination of Care X - Simple Patient / Family Education for ongoing care 1 [address] [place] Cardenas ([id number]) [phone].pdf Page 2 of 5 []  - 0 Complex (extensive) Patient / Family Education for ongoing care X- 1 10 Staff obtains Programmer, systems, Records, T Results / Process Orders est []  - 0 Staff telephones HHA, Nursing Homes / Clarify orders / etc []  - 0 Routine Transfer to another Facility (non-emergent condition) []  - 0 Routine Hospital Admission (non-emergent condition) []  - 0 New Admissions / Biomedical engineer / Ordering NPWT Apligraf, etc. , []  - 0 Emergency Hospital Admission (emergent condition) X- 1 10 Simple Discharge Coordination []  - 0 Complex (extensive) Discharge Coordination PROCESS - Special Needs []  - 0 Pediatric / Minor Patient Management []  - 0 Isolation Patient Management []  - 0 Hearing / Language / Visual special needs []   - 0 Assessment of Community assistance (transportation, D/C planning, etc.) []  - 0 Additional assistance / Altered mentation []  - 0 Support Surface(s) Assessment (bed, cushion, seat, etc.) INTERVENTIONS - Wound Cleansing /  Measurement X - Simple Wound Cleansing - one wound 1 5 []  - 0 Complex Wound Cleansing - multiple wounds []  - 0 Wound Imaging (photographs - any number of wounds) []  - 0 Wound Tracing (instead of photographs) X- 1 5 Simple Wound Measurement - one wound []  - 0 Complex Wound Measurement - multiple wounds INTERVENTIONS - Wound Dressings X - Small Wound Dressing one or multiple wounds 1 10 []  - 0 Medium Wound Dressing one or multiple wounds []  - 0 Large Wound Dressing one or multiple wounds []  - 0 Application of Medications - topical []  - 0 Application of Medications - injection INTERVENTIONS - Miscellaneous []  - 0 External ear exam []  - 0 Specimen Collection (cultures, biopsies, blood, body fluids, etc.) []  - 0 Specimen(s) / Culture(s) sent or taken to Lab for analysis []  - 0 Patient Transfer (multiple staff / Civil Service fast streamer / Similar devices) []  - 0 Simple Staple / Suture removal (25 or less) []  - 0 Complex Staple / Suture removal (26 or more) []  - 0 Hypo / Hyperglycemic Management (close monitor of Blood Glucose) []  - 0 Ankle / Brachial Index (ABI) - do not check if billed separately []  - 0 Vital Signs Has the patient been seen at the hospital within the last three years: Yes Total Score: 75 Level Of Care: New/Established - Level 2 Electronic Signature(s) Signed: 05/24/2022 10:47:35 AM By: [name] Entered By: [name] on 05/24/2022 10:36:24 [name] ([id number]) [id number] Page 3 of 5 -------------------------------------------------------------------------------- Encounter Discharge Information Details Patient Name: Date of Service: [name] 05/24/2022 10:30 A M Medical Record  Number: [id number] Patient Account Number: [id number] Date of Birth/Sex: Treating RN: [birth year] (72 y.o. [name], [name] Primary Care Charidy Cappelletti: [name], [name] Other Clinician: Referring Takai Chiaramonte: Treating Josetta Wigal/Extender: Genice Rouge., [name] in Treatment: 20 Encounter Discharge Information Items Discharge Condition: Stable Ambulatory Status: Ambulatory Discharge Destination: Home Transportation: Private Auto Accompanied By: self Schedule Follow-up Appointment: Yes Clinical Summary of Care: Patient Declined Electronic Signature(s) Signed: 05/24/2022 10:47:35 AM By: [name] Entered By: [name] on 05/24/2022 10:32:45 -------------------------------------------------------------------------------- Patient/Caregiver Education Details Patient Name: Date of Service: [name] 3/15/2024andnbsp10:30 A M Medical Record Number: [id number] Patient Account Number: [id number] Date of Birth/Gender: Treating RN: Apr 30, 1950 (72 y.o. [name], [name] Primary Care Physician: [name], [name] Other Clinician: Referring Physician: Treating Physician/Extender: Genice Rouge., [name] in Treatment: 20 Education Assessment Education Provided To: Patient Education Topics Provided Wound/Skin Impairment: Methods: Explain/Verbal Responses: Reinforcements needed, State content correctly [employer]) Signed: 05/24/2022 10:47:35 AM By: [name] Entered By: [name] on 05/24/2022 10:32:09 -------------------------------------------------------------------------------- Wound Assessment Details Patient Name: Date of Service: Marissa Perking Cardenas. 05/24/2022 10:30 A M Medical Record Number: [id number] Patient Account Number: [id number] Date of Birth/Sex: Treating RN: 21-Oct-1950 (72 y.o. [name], [name] Primary Care Johnmatthew Solorio: [name], [name] Other Clinician: Referring  Malee Grays: Treating Ameliyah Sarno/Extender: Genice Rouge., [name] in Treatment: 20 Wound Status Wound Number: 1 Primary Etiology: Pressure Ulcer Wound Location: Sacrum Wound Status: Open Wounding Event: Pressure Injury Date Acquired: 10/09/2021 Weeks Of Treatment: 20 Clustered Wound: No [name], [name] ([id number]) [phone].pdf Page 4 of 5 Wound Measurements Length: (cm) 0.4 Width: (cm) 0.4 Depth: (cm) 1 Area: (cm) 0.126 Volume: (cm) 0.126 % Reduction in Area: 64.3% % Reduction in Volume: 49% Wound Description Classification: Category/Stage IV Exudate Amount: Medium Exudate Type: Purulent Exudate Color: yellow, brown, green Periwound Skin Texture  Texture Color No Abnormalities Noted: No No Abnormalities Noted: No Moisture No Abnormalities Noted: No Treatment Notes Wound #1 (Sacrum) Cleanser Wound Cleanser Discharge Instruction: Cleanse the wound with wound cleanser prior to applying a clean dressing using gauze sponges, not tissue or cotton balls. Peri-Wound Care Skin Prep Discharge Instruction: Use skin prep as directed Topical Primary Dressing Hydrofera Blue Classic Foam, 4x4 in Discharge Instruction: Moisten with saline prior to applying to wound bed Secondary Dressing Zetuvit Plus Silicone Border Dressing 4x4 (in/in) Discharge Instruction: Apply silicone border over primary dressing as directed. Secured With Compression Wrap Compression Stockings Environmental education officer) Signed: 05/24/2022 10:47:35 AM By: [name] Entered By: [name] on 05/24/2022 10:31:49 -------------------------------------------------------------------------------- Vitals Details Patient Name: Date of Service: Marissa Perking Cardenas. 05/24/2022 10:30 A M Medical Record Number: [id number] Patient Account Number: [id number] Date of Birth/Sex: Treating RN: 1950/09/03 (72 y.o. [name], [name] Primary Care Nikelle Malatesta:  [name], [name] Other Clinician: Referring [name]: Treating Calley Drenning/Extender: Genice Rouge., [name] in Treatment: 20 Vital Signs Time Taken: 10:31 Reference Range: 80 - 120 mg / dl Height (in): 64 Weight (lbs): 97 Body Mass Index (BMI): 16.6 [name], [name] ([id number]) [phone].pdf Page 5 of 5 Electronic Signature(s) Signed: 05/24/2022 10:47:35 AM By: [name] Entered By: [name] on 05/24/2022 10:31:21

## 2022-05-27 ENCOUNTER — Encounter (HOSPITAL_BASED_OUTPATIENT_CLINIC_OR_DEPARTMENT_OTHER): Payer: 59 | Admitting: Internal Medicine

## 2022-05-27 DIAGNOSIS — E43 Unspecified severe protein-calorie malnutrition: Secondary | ICD-10-CM | POA: Diagnosis not present

## 2022-05-27 DIAGNOSIS — L89154 Pressure ulcer of sacral region, stage 4: Secondary | ICD-10-CM | POA: Diagnosis not present

## 2022-05-27 DIAGNOSIS — L89153 Pressure ulcer of sacral region, stage 3: Secondary | ICD-10-CM | POA: Diagnosis not present

## 2022-05-28 NOTE — Progress Notes (Signed)
DEALVA, MATLICK R ([id number]) [id number] Page 1 of 1 Visit Report for 05/27/2022 SuperBill Details Patient Name: Date of Service: [name] 05/27/2022 Medical Record Number: [id number] Patient Account Number: [id number] Date of Birth/Sex: Treating RN: Jul 04, 1950 (72 y.o. [name], [name] Primary Care Provider: [name], [name] Other Clinician: Referring Provider: Treating Provider/Extender: Dalene Seltzer in Treatment: 20 Diagnosis Coding ICD-10 Codes Code Description [phone] Pressure ulcer of sacral region, stage 4 M62.81 Muscle weakness (generalized) E43 Unspecified severe protein-calorie malnutrition Facility Procedures CPT4 Code Description Modifier Quantity [id number] [phone] - WOUND CARE VISIT-LEV 2 EST PT 1 Electronic Signature(s) Signed: 05/27/2022 12:10:44 PM By: Kalman Shan DO Signed: 05/27/2022 5:41:46 PM By: Deon Pilling RN, BSN Entered By: Deon Pilling on 05/27/2022 08:19:25

## 2022-05-29 ENCOUNTER — Encounter (HOSPITAL_BASED_OUTPATIENT_CLINIC_OR_DEPARTMENT_OTHER): Payer: 59 | Admitting: Physician Assistant

## 2022-05-29 DIAGNOSIS — Z681 Body mass index (BMI) 19 or less, adult: Secondary | ICD-10-CM | POA: Diagnosis not present

## 2022-05-29 DIAGNOSIS — E46 Unspecified protein-calorie malnutrition: Secondary | ICD-10-CM | POA: Diagnosis not present

## 2022-05-29 DIAGNOSIS — Z0000 Encounter for general adult medical examination without abnormal findings: Secondary | ICD-10-CM | POA: Diagnosis not present

## 2022-05-29 DIAGNOSIS — L89153 Pressure ulcer of sacral region, stage 3: Secondary | ICD-10-CM | POA: Diagnosis not present

## 2022-05-29 DIAGNOSIS — G8929 Other chronic pain: Secondary | ICD-10-CM | POA: Diagnosis not present

## 2022-05-29 DIAGNOSIS — I5032 Chronic diastolic (congestive) heart failure: Secondary | ICD-10-CM | POA: Diagnosis not present

## 2022-05-29 DIAGNOSIS — E43 Unspecified severe protein-calorie malnutrition: Secondary | ICD-10-CM | POA: Diagnosis not present

## 2022-05-29 DIAGNOSIS — J449 Chronic obstructive pulmonary disease, unspecified: Secondary | ICD-10-CM | POA: Diagnosis not present

## 2022-05-29 DIAGNOSIS — L89154 Pressure ulcer of sacral region, stage 4: Secondary | ICD-10-CM | POA: Diagnosis not present

## 2022-05-29 NOTE — Progress Notes (Addendum)
JARIA, HAPP R ([id number]) [id number] Page 1 of 7 Visit Report for 05/29/2022 Chief Complaint Document Details Patient Name: Date of Service: [name]. 05/29/2022 8:00 A M Medical Record Number: [id number] Patient Account Number: [id number] Date of Birth/Sex: Treating RN: December 13, 1950 (71 y.o. F) Primary Care Provider: [name], [name] Other Clinician: Referring Provider: Treating Provider/Extender: [name] in Treatment: 21 Information Obtained from: Patient Chief Complaint Pressure ulcer stage 3 Sacrum Electronic Signature(s) Signed: 05/29/2022 8:28:14 AM By: Worthy Keeler PA-C Entered By: Worthy Keeler on 05/29/2022 08:28:14 -------------------------------------------------------------------------------- HPI Details Patient Name: Date of Service: Marissa [name] 05/29/2022 8:00 A M Medical Record Number: [id number] Patient Account Number: [id number] Date of Birth/Sex: Treating RN: [birth year] (72 y.o. F) Primary Care Provider: [name], [name] Other Clinician: Referring Provider: Treating Provider/Extender: [name] in Treatment: 21 History of Present Illness HPI Description: 01-02-22 upon evaluation today patient presents for initial inspection here in our clinic concerning a wound that she has over the sacral region. This is stated to be present since around the beginning of August 2023. She currently resides in a assisted nursing facility. She does seem to be able to answer questions fully today. Subsequently I do note that she is a little on the thin side but again other than the protein calorie malnutrition she is minimally weak but still does get up and move around some but she also tells me that she "sits a lot". She has not had any x-rays of the sacral region at this point. 01-09-2022 upon evaluation today patient's wound in the sacral area actually appears to be  doing decently well. Fortunately I do not see any signs of infection which is great news and overall I am extremely pleased with where things stand today. 11/8; this is a patient who lives in some form of small assisted living in [place]. She has a deep wound over the lower part of her coccyx. An x-ray that we ordered from last time did not show any osseous abnormalities. We are supposed to be using Hydrofera Blue in the wound but I am really not sure what they are using to dress this and who is doing it. Talking to our staff they have apparently discussed this with the staff in the facility. She does not have home health. 01-23-2022 upon evaluation today patient appears to be doing decently well in regard to her wound. She has been tolerating the dressing changes without complication although I am not certain the dressing changes have been done appropriately over the past couple of weeks. We have been trying to get her to come in here we also try to get her into a wound care center in [place] which would be closer for the assisted living facility. Unfortunately neither 1 of those were undertaken up to this point. The patient did end up having some training of the staff from an RN that they brought him to have the staff perform the dressing changes but that being said it does not sound like this has been done correctly. Nonetheless I do believe that based on what we see it would benefit the patient to actually have her come here 3 times a week also think a wound VAC could potentially be a possibility for her which would help to get things moving in a much better direction as far as healing is concerned. We need to get this to fill-in though it looks  clean and does not look infected I do think that we need to really get this to fill-in and there is quite a bit of undermining that is to be considered here. 01-30-2022 upon evaluation today patient appears to be doing well currently in regard to her wound  which is looking pretty decent but still has quite a bit of space underneath as far as undermining is concerned. I think she might possibly be better with a wound VAC. With that being said if when I do this we probably only be able to do it 2 times a week at most. I discussed that with the patient today she is okay with that we just need to see if we can get the insurance approval and then of course the scheduling underway. 02-13-2022 upon evaluation today patient appears to be doing well currently in regard to her wound. She actually tells me that it is feeling a lot better which is good news. Fortunately I do not see any signs of active infection at this time. No fevers, chills, nausea, vomiting, or diarrhea. 02-27-2022 upon evaluation today patient appears to be doing well currently in regard to her wound. She is actually showing some signs of improvement we are still obtaining the approval for the snap VAC which I think could be beneficial in the meantime we been using the Hydrofera Blue rope. 03-27-2022 upon evaluation today patient appears to be doing okay in regard to her wound but there was no dressing in place upon arrival today. Fortunately I see again no evidence of infection. No fevers, chills, nausea, vomiting, or diarrhea. 04-03-2022 upon evaluation today patient did not have a dressing on when she came into the office. With that being said she actually tells me that it was on until yesterday she took a shower and it came loose and then today came off completely. With that being said I really do not think she needs to be taking shower every single day or having to see her here in the clinic 3 times per week on Monday, Wednesday, and Friday and during those times obviously in between she needs to probably avoid showering. For that reason what I advised her to do would be to shower in the mornings on the days that she comes to see [place] that way if the dressing does come off or start to come off  we will be changing it anyway. She voiced understanding and tells me she can do that. Unfortunately the facility has nobody to help her with changing the dressings and we are not currently having to do that here in the clinic again. This means SANGITA, KUBIAK R ([id number]) [id number] Page 2 of 7 that during the time that she was not coming in from December 20 through when I saw her last week on the 17th there was a period of 2 weeks where the wound really was not changed at all as the RN that Myriam Jacobson tell me they had at the facility did not end up working out. This obviously is not good and is not what we expect to see as far as patient care is concerned which is why we are now seeing her 3 times a week here in the clinic. 04-10-2022 upon evaluation today patient appears to be doing well currently in regard to her wound which is actually showing signs of good granulation epithelization at this point. However she does have quite a bit of drainage and we are little concerned about the possibility of  infection. For that reason I think she could benefit from a PCR culture followed by [facility] topical antibiotics. I am also thinking of going ahead and doing gentamicin today as well. 2/7; small wound on the lower sacrum however with considerable degree of undermining from roughly 7-4. PCR culture that was done last week showed "no organisms". We have been using gentamicin and iodoform packing.She lives in some form of group home in [place] I believe. Uncertain about the adequacy of offloading this area 04-24-2022 upon evaluation patient's wound actually is showing signs of doing decently well I do believe she would benefit from a snap VAC and we discussed that again today. Fortunately I do not see any signs of active infection locally nor systemically which is great news. No fevers, chills, nausea, vomiting, or diarrhea. 05-01-2022 upon evaluation today patient's wound is really  doing about the same. Fortunately I do not see any signs of active infection locally nor systemically at this time which is great news with that being said we have gotten approval for the snap VAC and we will going to go ahead and place that today. 05-08-2022 upon evaluation today patient appears to be doing well currently in regard to her wound all things considered but were having a difficult time with a snap VAC suctioning appropriately. With that being said we will get a go ahead and likely avoid the snap VAC at this point at least until we can get the actual brace dressing as bridging ourselves does not seem to be working nearly as well. 3/6; I note the difficulty with a snap VAC which is disappointing but apparently another type of VAC is being considered. Also a referral to plastic surgery at [facility]. The patient has a very small but deep and at the base circumferential undermining. She is coming in here now 3 times a week for [facility] placement 3/13; patient presents for follow-up. She has been using Hydrofera Blue 3 times a week. She comes into our clinic for nurse visits to have this done. We are still awaiting a wound VAC. She has no issues or complaints today. 05-29-2022 upon evaluation today patient appears to be doing about the same in regard to her wound. She still has significant undermining although the opening is a huge the undermining is significant. I do believe that a gauze VAC would be beneficial here. The patient does not eat normally and in fact tells me that she eats very well. She also is doing supplements with Juven at this point. She also has a good ability to get up and move around she is completely ambulatory and does not just lay with pressure on this area. She also is able to roll and reposition in bed without complication. For that reason she really has no need for a group 2 mattress at this point whatsoever this would be a complete waste of resources as far as  insurance and medical equipment is concerned. As far as her urinary incontinence she has no issues with incontinence at this point she is able to carry herself to the bathroom when needed she does wear a depends just in case but again that is not a routinely utilized item. Electronic Signature(s) Signed: 05/29/2022 9:11:35 AM By: Worthy Keeler PA-C Entered By: Worthy Keeler on 05/29/2022 09:11:35 -------------------------------------------------------------------------------- Physical Exam Details Patient Name: Date of Service: Marissa [name] 05/29/2022 8:00 A M Medical Record Number: [id number] Patient Account Number: [id number] Date of Birth/Sex: Treating RN: 01-Aug-1950 (71  y.o. F) Primary Care Provider: [name], [name] Other Clinician: Referring Provider: Treating Provider/Extender: [name] in Treatment: 63 Constitutional Well-nourished and well-hydrated in no acute distress. Respiratory normal breathing without difficulty. Psychiatric this patient is able to make decisions and demonstrates good insight into disease process. Alert and Oriented x 3. pleasant and cooperative. Notes Upon inspection patient's wound bed actually showed signs of good granulation epithelization at this point. I do think that she still have significant undermining I think that a gauze VAC could help to get this to fill-in I think using some gentamicin just to make sure that there is no bacterial buildup would be a good idea. Electronic Signature(s) Signed: 05/29/2022 9:11:59 AM By: Worthy Keeler PA-C Entered By: Worthy Keeler on 05/29/2022 09:11:58 -------------------------------------------------------------------------------- Physician Orders Details Patient Name: Date of Service: Marissa [name] 05/29/2022 8:00 A M Medical Record Number: [id number] Patient Account Number: [id number] Date of Birth/Sex: Treating RN: [birth year] (72 y.o. Benjaman Lobe Primary Care Provider: [name], [place] Clinician: PATSY, HERSHEY ([id number]) [id number] Page 3 of 7 Referring Provider: Treating Provider/Extender: [name] in Treatment: 21 Verbal / Phone Orders: No Diagnosis Coding Follow-up Appointments ppointment in 1 week. - w/ Margarita Grizzle ST and [place] Rm # 9 WEdnesday 06/05/22 @ 1:30 one Return A Nurse Visit: - Friday 05/31/22 @ 9:00 Rm # 7 (needs to be put in epic) and Monday 06/03/22 @ 11:00 (needs to be put in epic) Other: - Administrator Arbutus Ped) : [phone] PCR culture negative-no [employer] ordered. Regular culture negative as of 05/15/22 WE are ordering an x-ray for pt. We are sending the prescription for it with pt. It can be done at any [facility] facility. NO appointment necessary. Just take prescription in and hand it to front desk. If gauze kit wound vac has arrived to group home, pt. can bring it in with her to next appointment. Anesthetic (In clinic) Topical Lidocaine 5% applied to wound bed Negative Presssure Wound Therapy Wound Vac to wound continuously at 112mm/hg pressure - Can apply gauze kit wound vac once it comes in and pt. brings it with her. Apply gentamycin to wound bed first before packing with gauze. Black and White Foam combination - gauze kit medela wound vac- order through wound Q Off-Loading Turn and reposition every 2 hours Wound Treatment Wound #1 - Sacrum Cleanser: Wound Cleanser (Home Health) 3 x Per Week/30 Days Discharge Instructions: Cleanse the wound with wound cleanser prior to applying a clean dressing using gauze sponges, not tissue or cotton balls. Peri-Wound Care: Skin Prep (Home Health) 3 x Per Week/30 Days Discharge Instructions: Use skin prep as directed Topical: Gentamicin 3 x Per Week/30 Days Discharge Instructions: As directed by physician Prim Dressing: Hydrofera Blue Classic Foam, 4x4 in 3 x Per Week/30  Days ary Discharge Instructions: Moisten with saline prior to applying to wound bed Secondary Dressing: Zetuvit Plus Silicone Border Dressing 4x4 (in/in) 3 x Per Week/30 Days Discharge Instructions: Apply silicone border over primary dressing as directed. Radiology X-ray, coccyx - looking for infection/osteomyelitis Electronic Signature(s) Signed: 05/29/2022 6:05:31 PM By: Worthy Keeler PA-C Signed: 05/31/2022 11:12:19 AM By: [name] Entered By: [name] on 05/29/2022 16:36:17 Prescription 05/29/2022 -------------------------------------------------------------------------------- Burnett Harry PA Patient Name: Provider: [birth year] [id number] Date of Birth: NPI#Wanda Plump [id number] Sex: DEA #: [phone] Phone #: License #: [employer] Patient Address: [facility] [address] [place], [place] [address] [place], [place] [address] [phone] Allergies No [address] KAIMI, CHRESTMAN R ([id number]) [id number] Page 4 of 7 Provider's Orders X-ray, coccyx - looking for infection/osteomyelitis Hand Signature: Date(s): Electronic Signature(s) Signed: 05/29/2022 6:05:31 PM By: Worthy Keeler PA-C Signed: 05/31/2022 11:12:19 AM By: [name] Entered By: [name] on 05/29/2022 16:36:17 -------------------------------------------------------------------------------- Problem List Details Patient Name: Date of Service: Marissa Perking R. 05/29/2022 8:00 A M Medical Record Number: [id number] Patient Account Number: [id number] Date of Birth/Sex: Treating RN: 08-07-1950 (72 y.o. F) Primary Care Provider: [name], [name] Other Clinician: Referring Provider: Treating Provider/Extender: [name] in Treatment: 21 Active Problems ICD-10 Encounter Code Description Active Date  MDM Diagnosis L89.154 Pressure ulcer of sacral region, stage 4 01/02/2022 No Yes M62.81 Muscle weakness (generalized) 01/02/2022 No Yes E43 Unspecified severe protein-calorie malnutrition 01/02/2022 No Yes Inactive Problems Resolved Problems Electronic Signature(s) Signed: 05/29/2022 8:28:09 AM By: Worthy Keeler PA-C Entered By: Worthy Keeler on 05/29/2022 08:28:08 -------------------------------------------------------------------------------- Progress Note Details Patient Name: Date of Service: [name], Marissa Pew R. 05/29/2022 8:00 A M Medical Record Number: [id number] Patient Account Number: [id number] Date of Birth/Sex: Treating RN: 1950-11-17 (72 y.o. F) Primary Care Provider: [name], [name] Other Clinician: Referring Provider: Treating Provider/Extender: [name] in Treatment: 21 Subjective Chief Complaint Information obtained from Patient Pressure ulcer stage 3 Sacrum History of Present Illness (HPI) 01-02-22 upon evaluation today patient presents for initial inspection here in our clinic concerning a wound that she has over the sacral region. This is stated to be present since around the beginning of August 2023. She currently resides in a assisted nursing facility. She does seem to be able to answer questions fully today. Subsequently I do note that she is a little on the thin side but again other than the protein calorie malnutrition she is minimally weak but still does get up and move around some but she also tells me that she "sits a lot". She has not had any x-rays of the sacral region at this point. ADYSON, STANISLAWSKI R ([id number]) [id number] Page 5 of 7 01-09-2022 upon evaluation today patient's wound in the sacral area actually appears to be doing decently well. Fortunately I do not see any signs of infection which is great news and overall I am extremely pleased with where things stand today. 11/8; this is  a patient who lives in some form of small assisted living in [place]. She has a deep wound over the lower part of her coccyx. An x-ray that we ordered from last time did not show any osseous abnormalities. We are supposed to be using Hydrofera Blue in the wound but I am really not sure what they are using to dress this and who is doing it. Talking to our staff they have apparently discussed this with the staff in the facility. She does not have home health. 01-23-2022 upon evaluation today patient appears to be doing decently well in regard to her wound. She has been tolerating the dressing changes without complication although I am not certain the dressing changes have been done appropriately over the past couple of weeks. We have been trying to get her to come in here we also try to get her into a wound care center in [place] which would be closer for the assisted living facility. Unfortunately neither 1 of those were undertaken  up to this point. The patient did end up having some training of the staff from an RN that they brought him to have the staff perform the dressing changes but that being said it does not sound like this has been done correctly. Nonetheless I do believe that based on what we see it would benefit the patient to actually have her come here 3 times a week also think a wound VAC could potentially be a possibility for her which would help to get things moving in a much better direction as far as healing is concerned. We need to get this to fill-in though it looks clean and does not look infected I do think that we need to really get this to fill-in and there is quite a bit of undermining that is to be considered here. 01-30-2022 upon evaluation today patient appears to be doing well currently in regard to her wound which is looking pretty decent but still has quite a bit of space underneath as far as undermining is concerned. I think she might possibly be better with a wound VAC.  With that being said if when I do this we probably only be able to do it 2 times a week at most. I discussed that with the patient today she is okay with that we just need to see if we can get the insurance approval and then of course the scheduling underway. 02-13-2022 upon evaluation today patient appears to be doing well currently in regard to her wound. She actually tells me that it is feeling a lot better which is good news. Fortunately I do not see any signs of active infection at this time. No fevers, chills, nausea, vomiting, or diarrhea. 02-27-2022 upon evaluation today patient appears to be doing well currently in regard to her wound. She is actually showing some signs of improvement we are still obtaining the approval for the snap VAC which I think could be beneficial in the meantime we been using the Hydrofera Blue rope. 03-27-2022 upon evaluation today patient appears to be doing okay in regard to her wound but there was no dressing in place upon arrival today. Fortunately I see again no evidence of infection. No fevers, chills, nausea, vomiting, or diarrhea. 04-03-2022 upon evaluation today patient did not have a dressing on when she came into the office. With that being said she actually tells me that it was on until yesterday she took a shower and it came loose and then today came off completely. With that being said I really do not think she needs to be taking shower every single day or having to see her here in the clinic 3 times per week on Monday, Wednesday, and Friday and during those times obviously in between she needs to probably avoid showering. For that reason what I advised her to do would be to shower in the mornings on the days that she comes to see [place] that way if the dressing does come off or start to come off we will be changing it anyway. She voiced understanding and tells me she can do that. Unfortunately the facility has nobody to help her with changing the dressings and  we are not currently having to do that here in the clinic again. This means that during the time that she was not coming in from December 20 through when I saw her last week on the 17th there was a period of 2 weeks where the wound really was not changed at all  as the RN that Myriam Jacobson tell me they had at the facility did not end up working out. This obviously is not good and is not what we expect to see as far as patient care is concerned which is why we are now seeing her 3 times a week here in the clinic. 04-10-2022 upon evaluation today patient appears to be doing well currently in regard to her wound which is actually showing signs of good granulation epithelization at this point. However she does have quite a bit of drainage and we are little concerned about the possibility of infection. For that reason I think she could benefit from a PCR culture followed by [facility] topical antibiotics. I am also thinking of going ahead and doing gentamicin today as well. 2/7; small wound on the lower sacrum however with considerable degree of undermining from roughly 7-4. PCR culture that was done last week showed "no organisms". We have been using gentamicin and iodoform packing.She lives in some form of group home in [place] I believe. Uncertain about the adequacy of offloading this area 04-24-2022 upon evaluation patient's wound actually is showing signs of doing decently well I do believe she would benefit from a snap VAC and we discussed that again today. Fortunately I do not see any signs of active infection locally nor systemically which is great news. No fevers, chills, nausea, vomiting, or diarrhea. 05-01-2022 upon evaluation today patient's wound is really doing about the same. Fortunately I do not see any signs of active infection locally nor systemically at this time which is great news with that being said we have gotten approval for the snap VAC and we will going to go ahead and place that  today. 05-08-2022 upon evaluation today patient appears to be doing well currently in regard to her wound all things considered but were having a difficult time with a snap VAC suctioning appropriately. With that being said we will get a go ahead and likely avoid the snap VAC at this point at least until we can get the actual brace dressing as bridging ourselves does not seem to be working nearly as well. 3/6; I note the difficulty with a snap VAC which is disappointing but apparently another type of VAC is being considered. Also a referral to plastic surgery at [facility]. The patient has a very small but deep and at the base circumferential undermining. She is coming in here now 3 times a week for [facility] placement 3/13; patient presents for follow-up. She has been using Hydrofera Blue 3 times a week. She comes into our clinic for nurse visits to have this done. We are still awaiting a wound VAC. She has no issues or complaints today. 05-29-2022 upon evaluation today patient appears to be doing about the same in regard to her wound. She still has significant undermining although the opening is a huge the undermining is significant. I do believe that a gauze VAC would be beneficial here. The patient does not eat normally and in fact tells me that she eats very well. She also is doing supplements with Juven at this point. She also has a good ability to get up and move around she is completely ambulatory and does not just lay with pressure on this area. She also is able to roll and reposition in bed without complication. For that reason she really has no need for a group 2 mattress at this point whatsoever this would be a complete waste of resources as far as insurance and  medical equipment is concerned. As far as her urinary incontinence she has no issues with incontinence at this point she is able to carry herself to the bathroom when needed she does wear a depends just in case but again that is  not a routinely utilized item. Objective Constitutional Well-nourished and well-hydrated in no acute distress. Vitals Time Taken: 8:05 AM, Height: 64 in, Weight: 97 lbs, BMI: 16.6, Temperature: 98.2 F, Pulse: 81 bpm, Respiratory Rate: 17 breaths/min, Blood Pressure: 95/51 mmHg. [name], Marissa R ([id number]) [id number] Page 6 of 7 Respiratory normal breathing without difficulty. Psychiatric this patient is able to make decisions and demonstrates good insight into disease process. Alert and Oriented x 3. pleasant and cooperative. General Notes: Upon inspection patient's wound bed actually showed signs of good granulation epithelization at this point. I do think that she still have significant undermining I think that a gauze VAC could help to get this to fill-in I think using some gentamicin just to make sure that there is no bacterial buildup would be a good idea. Integumentary (Hair, Skin) Wound #1 status is Open. Original cause of wound was Pressure Injury. The date acquired was: 10/09/2021. The wound has been in treatment 21 weeks. The wound is located on the Sacrum. The wound measures 0.4cm length x 0.4cm width x 1.2cm depth; 0.126cm^2 area and 0.151cm^3 volume. There is Fat Layer (Subcutaneous Tissue) exposed. There is no tunneling noted, however, there is undermining starting at 12:00 and ending at 12:00 with a maximum distance of 3.7cm. There is a medium amount of serosanguineous drainage noted. Foul odor after cleansing was noted. The wound margin is distinct with the outline attached to the wound base. There is large (67-100%) red, pink granulation within the wound bed. There is a small (1-33%) amount of necrotic tissue within the wound bed including Adherent Slough. The periwound skin appearance did not exhibit: Callus, Crepitus, Excoriation, Induration, Rash, Scarring, Dry/Scaly, Maceration, Atrophie Blanche, Cyanosis, Ecchymosis, Hemosiderin Staining,  Mottled, Pallor, Rubor, Erythema. Periwound temperature was noted as No Abnormality. The periwound has tenderness on palpation. Assessment Active Problems ICD-10 Pressure ulcer of sacral region, stage 4 Muscle weakness (generalized) Unspecified severe protein-calorie malnutrition Plan Follow-up Appointments: Return Appointment in 1 week. - w/ Margarita Grizzle ST and [facility] Rm # 9 WEdnesday 06/05/22 @ 1:30 one Nurse Visit: - Friday 05/31/22 @ 9:00 Rm # 7 (needs to be put in epic) and Wednesday 06/03/22 @ 11:00 (needs to be put in epic) Other: - Administrator [facility] Ped) : [phone] PCR culture negative-no [employer] ordered. Regular culture negative as of 05/15/22 Anesthetic: (In clinic) Topical Lidocaine 5% applied to wound bed Negative Presssure Wound Therapy: Black and White Foam combination - medela wound vac- order through wound Q Off-Loading: Turn and reposition every 2 hours Radiology ordered were: X-ray, coccyx - looking for infection/osteomyelitis WOUND #1: - Sacrum Wound Laterality: Cleanser: Wound Cleanser (Home Health) 2 x Per Week/30 Days Discharge Instructions: Cleanse the wound with wound cleanser prior to applying a clean dressing using gauze sponges, not tissue or cotton balls. Peri-Wound Care: Skin Prep (Home Health) 2 x Per Week/30 Days Discharge Instructions: Use skin prep as directed Prim Dressing: Hydrofera Blue Classic Foam, 4x4 in 2 x Per Week/30 Days ary Discharge Instructions: Moisten with saline prior to applying to wound bed Secondary Dressing: Zetuvit Plus Silicone Border Dressing 4x4 (in/in) 2 x Per Week/30 Days Discharge Instructions: Apply silicone border over primary dressing as directed. 1. Based on what I am seeing I do believe that the patient could  benefit from a continuation of therapy with the [facility] for now although soon as she gets the gauze VAC I would recommend that we get this started for her as quickly as possible. 2. Also I am going to recommend  the patient should continue to monitor for any signs of infection or worsening. Based on what I am seeing I do believe that she is doing quite well however with the wound VAC. I do think we can use some gentamicin just to help with any potential for infection but again I do not see much that I think is going to be a significant issue here. 3. Because her x-ray last was not October 2023 I am going to double check an x-ray just to make sure thing looks fine though her current cultures both the PCR and irregular have been negative. We will see patient back for reevaluation in 1 week here in the clinic. If anything worsens or changes patient will contact our office for additional recommendations. Electronic Signature(s) Signed: 05/29/2022 9:20:14 AM By: Worthy Keeler PA-C Entered By: Worthy Keeler on 05/29/2022 09:20:14 [name] ([id number]) [id number] Page 7 of 7 -------------------------------------------------------------------------------- SuperBill Details Patient Name: Date of Service: [name] 05/29/2022 Medical Record Number: [id number] Patient Account Number: [id number] Date of Birth/Sex: Treating RN: 10/12/1950 (72 y.o. [name], [name] Primary Care Provider: [name], [name] Other Clinician: Referring Provider: Treating Provider/Extender: [name] in Treatment: 21 Diagnosis Coding ICD-10 Codes Code Description [phone] Pressure ulcer of sacral region, stage 4 M62.81 Muscle weakness (generalized) E43 Unspecified severe protein-calorie malnutrition Facility Procedures : CPT4 Code: [id number] Description: [id number] - WOUND CARE VISIT-LEV 3 EST PT Modifier: Quantity: 1 Physician Procedures : CPT4 Code Description Modifier [id number] - WC PHYS LEVEL 4 - EST PT ICD-10 Diagnosis Description L89.154 Pressure ulcer of sacral region, stage 4 M62.81 Muscle weakness (generalized) E43 Unspecified severe  protein-calorie malnutrition Quantity: 1 Electronic Signature(s) Signed: 05/29/2022 9:27:05 AM By: Worthy Keeler PA-C Entered By: Worthy Keeler on 05/29/2022 09:27:05

## 2022-05-31 ENCOUNTER — Encounter (HOSPITAL_BASED_OUTPATIENT_CLINIC_OR_DEPARTMENT_OTHER): Payer: 59 | Admitting: Internal Medicine

## 2022-05-31 DIAGNOSIS — L89154 Pressure ulcer of sacral region, stage 4: Secondary | ICD-10-CM | POA: Diagnosis not present

## 2022-05-31 DIAGNOSIS — E43 Unspecified severe protein-calorie malnutrition: Secondary | ICD-10-CM | POA: Diagnosis not present

## 2022-05-31 DIAGNOSIS — L89153 Pressure ulcer of sacral region, stage 3: Secondary | ICD-10-CM | POA: Diagnosis not present

## 2022-06-03 ENCOUNTER — Encounter (HOSPITAL_BASED_OUTPATIENT_CLINIC_OR_DEPARTMENT_OTHER): Payer: 59 | Admitting: Internal Medicine

## 2022-06-03 DIAGNOSIS — Z1231 Encounter for screening mammogram for malignant neoplasm of breast: Secondary | ICD-10-CM | POA: Diagnosis not present

## 2022-06-03 DIAGNOSIS — L89154 Pressure ulcer of sacral region, stage 4: Secondary | ICD-10-CM | POA: Diagnosis not present

## 2022-06-03 DIAGNOSIS — L89153 Pressure ulcer of sacral region, stage 3: Secondary | ICD-10-CM | POA: Diagnosis not present

## 2022-06-03 DIAGNOSIS — E43 Unspecified severe protein-calorie malnutrition: Secondary | ICD-10-CM | POA: Diagnosis not present

## 2022-06-05 ENCOUNTER — Encounter (HOSPITAL_BASED_OUTPATIENT_CLINIC_OR_DEPARTMENT_OTHER): Payer: 59 | Admitting: Physician Assistant

## 2022-06-05 DIAGNOSIS — L89154 Pressure ulcer of sacral region, stage 4: Secondary | ICD-10-CM | POA: Diagnosis not present

## 2022-06-05 DIAGNOSIS — E43 Unspecified severe protein-calorie malnutrition: Secondary | ICD-10-CM | POA: Diagnosis not present

## 2022-06-05 DIAGNOSIS — L89153 Pressure ulcer of sacral region, stage 3: Secondary | ICD-10-CM | POA: Diagnosis not present

## 2022-06-05 NOTE — Progress Notes (Addendum)
[name], [name] ([id number]) [id number] Page 1 of 7 Visit Report for 06/05/2022 Chief Complaint Document Details Patient Name: Date of Service: [name] 06/05/2022 1:30 PM Medical Record Number: [id number] Patient Account Number: [id number] Date of Birth/Sex: Treating RN: 07-14-1950 (72 y.o. F) Primary Care Provider: [name], Herbie Cardenas Other Clinician: Referring Provider: Treating Provider/Extender: [name]: 22 Information Obtained from: Patient Chief Complaint Pressure ulcer stage 3 Sacrum Electronic Signature(s) Signed: 06/05/2022 1:37:39 PM By: Marissa Keeler Cardenas Entered By: [name] on 06/05/2022 13:37:39 -------------------------------------------------------------------------------- HPI Details Patient Name: Date of Service: [name]. 06/05/2022 1:30 PM Medical Record Number: [id number] Patient Account Number: [id number] Date of Birth/Sex: Treating RN: 06/07/1950 (72 y.o. F) Primary Care Provider: [name], Herbie Cardenas Other Clinician: Referring Provider: Treating Provider/Extender: [name]: 22 History of Present Illness HPI Description: 01-02-22 upon evaluation today patient presents for initial inspection here in our clinic concerning a wound that she has over the sacral region. This is stated to be present since around the beginning of August 2023. She currently resides in a assisted nursing facility. She does seem to be able to answer questions fully today. Subsequently I do note that she is a little on the thin side but again other than the protein calorie malnutrition she is minimally weak but still does get up and move around some but she also tells me that she "sits a lot". She has not had any x-rays of the sacral region at this point. 01-09-2022 upon evaluation today patient's wound in the sacral area actually appears to be  doing decently well. Fortunately I do not see any signs of infection which is great news and overall I am extremely pleased with where things stand today. 11/8; this is a patient who lives in some form of small assisted living in [place]. She has a deep wound over the lower part of her coccyx. An x-ray that we ordered from last time did not show any osseous abnormalities. We are supposed to be using Hydrofera Blue in the wound but I am really not sure what they are using to dress this and who is doing it. Talking to our staff they have apparently discussed this with the staff in the facility. She does not have home health. 01-23-2022 upon evaluation today patient appears to be doing decently well in regard to her wound. She has been tolerating the dressing changes without complication although I am not certain the dressing changes have been done appropriately over the past couple of weeks. We have been trying to get her to come in here we also try to get her into a wound care center in [place] which would be closer for the assisted living facility. Unfortunately neither 1 of those were undertaken up to this point. The patient did end up having some training of the staff from an RN that they brought him to have the staff perform the dressing changes but that being said it does not sound like this has been done correctly. Nonetheless I do believe that based on what we see it would benefit the patient to actually have her come here 3 times a week also think a wound VAC could potentially be a possibility for her which would help to get things moving in a much better direction as far as healing is concerned. We need to get this to fill-in though it looks clean and  does not look infected I do think that we need to really get this to fill-in and there is quite a bit of undermining that is to be considered here. 01-30-2022 upon evaluation today patient appears to be doing well currently in regard to her wound  which is looking pretty decent but still has quite a bit of space underneath as far as undermining is concerned. I think she might possibly be better with a wound VAC. With that being said if when I do this we probably only be able to do it 2 times a week at most. I discussed that with the patient today she is okay with that we just need to see if we can get the insurance approval and then of course the scheduling underway. 02-13-2022 upon evaluation today patient appears to be doing well currently in regard to her wound. She actually tells me that it is feeling a lot better which is good news. Fortunately I do not see any signs of active infection at this time. No fevers, chills, nausea, vomiting, or diarrhea. 02-27-2022 upon evaluation today patient appears to be doing well currently in regard to her wound. She is actually showing some signs of improvement we are still obtaining the approval for the snap VAC which I think could be beneficial in the meantime we been using the Hydrofera Blue rope. 03-27-2022 upon evaluation today patient appears to be doing okay in regard to her wound but there was no dressing in place upon arrival today. Fortunately I see again no evidence of infection. No fevers, chills, nausea, vomiting, or diarrhea. 04-03-2022 upon evaluation today patient did not have a dressing on when she came into the office. With that being said she actually tells me that it was on until yesterday she took a shower and it came loose and then today came off completely. With that being said I really do not think she needs to be taking shower every single day or having to see her here in the clinic 3 times per week on Monday, Wednesday, and Friday and during those times obviously in between she needs to probably avoid showering. For that reason what I advised her to do would be to shower in the mornings on the days that she comes to see [place] that way if the dressing does come off or start to come off  we will be changing it anyway. She voiced understanding and tells me she can do that. Unfortunately the facility has nobody to help her with changing the dressings and we are not currently having to do that here in the clinic again. This means PATRECIA, ALICEA Cardenas ([id number]) [id number] Page 2 of 7 that during the time that she was not coming in from December 20 through when I saw her last week on the 17th there was a period of 2 weeks where the wound really was not changed at all as the RN that Myriam Jacobson tell me they had at the facility did not end up working out. This obviously is not good and is not what we expect to see as far as patient care is concerned which is why we are now seeing her 3 times a week here in the clinic. 04-10-2022 upon evaluation today patient appears to be doing well currently in regard to her wound which is actually showing signs of good granulation epithelization at this point. However she does have quite a bit of drainage and we are little concerned about the possibility of infection. For  that reason I think she could benefit from a PCR culture followed by [facility] topical antibiotics. I am also thinking of going ahead and doing gentamicin today as well. 2/7; small wound on the lower sacrum however with considerable degree of undermining from roughly 7-4. PCR culture that was done last week showed "no organisms". We have been using gentamicin and iodoform packing.She lives in some form of group home in [place] I believe. Uncertain about the adequacy of offloading this area 04-24-2022 upon evaluation patient's wound actually is showing signs of doing decently well I do believe she would benefit from a snap VAC and we discussed that again today. Fortunately I do not see any signs of active infection locally nor systemically which is great news. No fevers, chills, nausea, vomiting, or diarrhea. 05-01-2022 upon evaluation today patient's wound is really  doing about the same. Fortunately I do not see any signs of active infection locally nor systemically at this time which is great news with that being said we have gotten approval for the snap VAC and we will going to go ahead and place that today. 05-08-2022 upon evaluation today patient appears to be doing well currently in regard to her wound all things considered but were having a difficult time with a snap VAC suctioning appropriately. With that being said we will get a go ahead and likely avoid the snap VAC at this point at least until we can get the actual brace dressing as bridging ourselves does not seem to be working nearly as well. 3/6; I note the difficulty with a snap VAC which is disappointing but apparently another type of VAC is being considered. Also a referral to plastic surgery at [facility] The patient has a very small but deep and at the base circumferential undermining. She is coming in here now 3 times a week for [facility] placement 3/13; patient presents for follow-up. She has been using Hydrofera Blue 3 times a week. She comes into our clinic for nurse visits to have this done. We are still awaiting a wound VAC. She has no issues or complaints today. 05-29-2022 upon evaluation today patient appears to be doing about the same in regard to her wound. She still has significant undermining although the opening is a huge the undermining is significant. I do believe that a gauze VAC would be beneficial here. The patient does not eat normally and in fact tells me that she eats very well. She also is doing supplements with Juven at this point. She also has a good ability to get up and move around she is completely ambulatory and does not just lay with pressure on this area. She also is able to roll and reposition in bed without complication. For that reason she really has no need for a group 2 mattress at this point whatsoever this would be a complete waste of resources as far as  insurance and medical equipment is concerned. As far as her urinary incontinence she has no issues with incontinence at this point she is able to carry herself to the bathroom when needed she does wear a depends just in case but again that is not a routinely utilized item. 06-05-2022 upon evaluation today patient appears to be doing decently well in regard to her wound I do not see any signs of infection overall she seems to be doing quite well. I do think that she is making good progress in general here. We do have the wound VAC however and I think  this is going to potentially help to get this. Hand has been the biggest issue we have had is getting this to actually feeling more effectively. Electronic Signature(s) Signed: 06/05/2022 1:48:31 PM By: Marissa Keeler Cardenas Entered By: [name] on 06/05/2022 13:48:30 -------------------------------------------------------------------------------- Physical Exam Details Patient Name: Date of Service: [name]. 06/05/2022 1:30 PM Medical Record Number: [id number] Patient Account Number: [id number] Date of Birth/Sex: Treating RN: 21-May-1950 (72 y.o. F) Primary Care Provider: [name], Herbie Cardenas Other Clinician: Referring Provider: Treating Provider/Extender: [name]: 43 Constitutional Well-nourished and well-hydrated in no acute distress. Respiratory normal breathing without difficulty. Psychiatric this patient is able to make decisions and demonstrates good insight into disease process. Alert and Oriented x 3. pleasant and cooperative. Notes Upon inspection patient's wound bed actually showed signs of good granulation epithelization at this point. Fortunately I do not see any evidence of active infection locally nor systemically which is great news and overall I am extremely pleased with where we stand today. Electronic Signature(s) Signed: 06/05/2022 1:48:52 PM By: Marissa Keeler  Cardenas Entered By: [name] on 06/05/2022 13:48:52 -------------------------------------------------------------------------------- Physician Orders Details Patient Name: Date of Service: Marissa CKRELL, [place] 06/05/2022 1:30 PM [name] ([id number]) [phone].pdf Page 3 of 7 Medical Record Number: [id number] Patient Account Number: [id number] Date of Birth/Sex: Treating RN: 15-Feb-1951 (72 y.o. [name], [name] Primary Care Provider: [name], Herbie Cardenas Other Clinician: Referring Provider: Treating Provider/Extender: [name]: 22 Verbal / Phone Orders: No Diagnosis Coding ICD-10 Coding Code Description L89.154 Pressure ulcer of sacral region, stage 4 M62.81 Muscle weakness (generalized) E43 Unspecified severe protein-calorie malnutrition Follow-up Appointments ppointment in 1 week. [facility] [facility] and [place] Rm # 9 WEdnesday 06/12/22 @ 10:15 one Return A Nurse Visit: - Friday 06/07/22 @ 11:00 Rm # 6 Monday 06/10/22 @ 12:00 Rm # 7 Friday 06/14/22 @ 7:30 Dr. Glenford Peers side w/ Shearon Stalls # 3 Other: - Administrator [place] Ped) : [phone] PCR culture negative-no [employer] ordered. Regular culture negative as of 05/15/22 WE are ordering an x-ray for pt. We are sending the prescription for it with pt. It can be done at any [facility] facility. NO appointment necessary. Just take prescription in and hand it to front desk. Anesthetic (In clinic) Topical Lidocaine 5% applied to wound bed Negative Presssure Wound Therapy Wound Vac to wound continuously at 113mm/hg pressure - Apply gauze wound vac kit to wound. Change 3 x a week. Apply gentamicin first to wound bed. Black and White Foam combination - gauze kit medela wound vac- order through wound Q Off-Loading Turn and reposition every 2 hours Wound Cardenas Wound #1 - Sacrum Cleanser: Wound Cleanser (Home Health) 3 x Per Week/30 Days Discharge  Instructions: Cleanse the wound with wound cleanser prior to applying a clean dressing using gauze sponges, not tissue or cotton balls. Peri-Wound Care: Skin Prep ([place]) 3 x Per Week/30 Days Discharge Instructions: Use skin prep as directed Topical: Gentamicin 3 x Per Week/30 Days Discharge Instructions: As directed by physician Electronic Signature(s) Signed: 06/05/2022 3:38:10 PM By: Marissa Keeler Cardenas Signed: 06/12/2022 4:26:45 PM By: [name] Entered By: [name] on 06/05/2022 13:56:50 -------------------------------------------------------------------------------- Problem List Details Patient Name: Date of Service: [name]. 06/05/2022 1:30 PM Medical Record Number: [id number] Patient Account Number: [id number] Date of Birth/Sex: Treating RN: [birth year] (72 y.o. F) Primary Care Provider: [name],  Herbie Cardenas Other Clinician: Referring Provider: Treating Provider/Extender: [name]: 22 Active Problems ICD-10 Encounter Code Description Active Date MDM Diagnosis L89.154 Pressure ulcer of sacral region, stage 4 01/02/2022 No Yes ALTAIR, POLLMANN Cardenas ([id number]) [phone].pdf Page 4 of 7 M62.81 Muscle weakness (generalized) 01/02/2022 No Yes E43 Unspecified severe protein-calorie malnutrition 01/02/2022 No Yes Inactive Problems Resolved Problems Electronic Signature(s) Signed: 06/05/2022 1:37:26 PM By: Marissa Keeler Cardenas Entered By: [name] on 06/05/2022 13:37:26 -------------------------------------------------------------------------------- Progress Note Details Patient Name: Date of Service: [name]. 06/05/2022 1:30 PM Medical Record Number: [id number] Patient Account Number: [id number] Date of Birth/Sex: Treating RN: [birth year] (72 y.o. F) Primary Care Provider: [name], Herbie Cardenas Other Clinician: Referring Provider: Treating Provider/Extender: [name]: 22 Subjective Chief Complaint Information obtained from Patient Pressure ulcer stage 3 Sacrum History of Present Illness (HPI) 01-02-22 upon evaluation today patient presents for initial inspection here in our clinic concerning a wound that she has over the sacral region. This is stated to be present since around the beginning of August 2023. She currently resides in a assisted nursing facility. She does seem to be able to answer questions fully today. Subsequently I do note that she is a little on the thin side but again other than the protein calorie malnutrition she is minimally weak but still does get up and move around some but she also tells me that she "sits a lot". She has not had any x-rays of the sacral region at this point. 01-09-2022 upon evaluation today patient's wound in the sacral area actually appears to be doing decently well. Fortunately I do not see any signs of infection which is great news and overall I am extremely pleased with where things stand today. 11/8; this is a patient who lives in some form of small assisted living in [place]. She has a deep wound over the lower part of her coccyx. An x-ray that we ordered from last time did not show any osseous abnormalities. We are supposed to be using Hydrofera Blue in the wound but I am really not sure what they are using to dress this and who is doing it. Talking to our staff they have apparently discussed this with the staff in the facility. She does not have home health. 01-23-2022 upon evaluation today patient appears to be doing decently well in regard to her wound. She has been tolerating the dressing changes without complication although I am not certain the dressing changes have been done appropriately over the past couple of weeks. We have been trying to get her to come in here we also try to get her into a wound care center in [place] which would be closer for the  assisted living facility. Unfortunately neither 1 of those were undertaken up to this point. The patient did end up having some training of the staff from an RN that they brought him to have the staff perform the dressing changes but that being said it does not sound like this has been done correctly. Nonetheless I do believe that based on what we see it would benefit the patient to actually have her come here 3 times a week also think a wound VAC could potentially be a possibility for her which would help to get things moving in a much better direction as far as healing is concerned. We need to get this to fill-in though it looks clean and does  not look infected I do think that we need to really get this to fill-in and there is quite a bit of undermining that is to be considered here. 01-30-2022 upon evaluation today patient appears to be doing well currently in regard to her wound which is looking pretty decent but still has quite a bit of space underneath as far as undermining is concerned. I think she might possibly be better with a wound VAC. With that being said if when I do this we probably only be able to do it 2 times a week at most. I discussed that with the patient today she is okay with that we just need to see if we can get the insurance approval and then of course the scheduling underway. 02-13-2022 upon evaluation today patient appears to be doing well currently in regard to her wound. She actually tells me that it is feeling a lot better which is good news. Fortunately I do not see any signs of active infection at this time. No fevers, chills, nausea, vomiting, or diarrhea. 02-27-2022 upon evaluation today patient appears to be doing well currently in regard to her wound. She is actually showing some signs of improvement we are still obtaining the approval for the snap VAC which I think could be beneficial in the meantime we been using the Hydrofera Blue rope. 03-27-2022 upon evaluation  today patient appears to be doing okay in regard to her wound but there was no dressing in place upon arrival today. Fortunately I see again no evidence of infection. No fevers, chills, nausea, vomiting, or diarrhea. 04-03-2022 upon evaluation today patient did not have a dressing on when she came into the office. With that being said she actually tells me that it was on until yesterday she took a shower and it came loose and then today came off completely. With that being said I really do not think she needs to be taking shower every single day or having to see her here in the clinic 3 times per week on Monday, Wednesday, and Friday and during those times obviously in between she needs to probably avoid showering. For that reason what I advised her to do would be to shower in the mornings on the days that she comes to see [place] that way if the dressing does come off or start to come off we will be changing it anyway. She voiced understanding and tells me she can do that. Unfortunately the facility has nobody to help her with changing the dressings and we are not currently having to do that here in the clinic again. This means that during the time that she was not coming in from December 20 through when I saw her last week on the 17th there was a period of 2 weeks where the wound really was not changed at all as the RN that Myriam Jacobson tell me they had at the facility did not end up working out. This obviously is not good and is not what PAULINE, RITCHIE Cardenas ([id number]) [id number] Page 5 of 7 we expect to see as far as patient care is concerned which is why we are now seeing her 3 times a week here in the clinic. 04-10-2022 upon evaluation today patient appears to be doing well currently in regard to her wound which is actually showing signs of good granulation epithelization at this point. However she does have quite a bit of drainage and we are little concerned about the possibility of  infection. For that  reason I think she could benefit from a PCR culture followed by [facility] topical antibiotics. I am also thinking of going ahead and doing gentamicin today as well. 2/7; small wound on the lower sacrum however with considerable degree of undermining from roughly 7-4. PCR culture that was done last week showed "no organisms". We have been using gentamicin and iodoform packing.She lives in some form of group home in [place] I believe. Uncertain about the adequacy of offloading this area 04-24-2022 upon evaluation patient's wound actually is showing signs of doing decently well I do believe she would benefit from a snap VAC and we discussed that again today. Fortunately I do not see any signs of active infection locally nor systemically which is great news. No fevers, chills, nausea, vomiting, or diarrhea. 05-01-2022 upon evaluation today patient's wound is really doing about the same. Fortunately I do not see any signs of active infection locally nor systemically at this time which is great news with that being said we have gotten approval for the snap VAC and we will going to go ahead and place that today. 05-08-2022 upon evaluation today patient appears to be doing well currently in regard to her wound all things considered but were having a difficult time with a snap VAC suctioning appropriately. With that being said we will get a go ahead and likely avoid the snap VAC at this point at least until we can get the actual brace dressing as bridging ourselves does not seem to be working nearly as well. 3/6; I note the difficulty with a snap VAC which is disappointing but apparently another type of VAC is being considered. Also a referral to plastic surgery at [facility]. The patient has a very small but deep and at the base circumferential undermining. She is coming in here now 3 times a week for [facility] placement 3/13; patient presents for follow-up. She has been using Hydrofera  Blue 3 times a week. She comes into our clinic for nurse visits to have this done. We are still awaiting a wound VAC. She has no issues or complaints today. 05-29-2022 upon evaluation today patient appears to be doing about the same in regard to her wound. She still has significant undermining although the opening is a huge the undermining is significant. I do believe that a gauze VAC would be beneficial here. The patient does not eat normally and in fact tells me that she eats very well. She also is doing supplements with Juven at this point. She also has a good ability to get up and move around she is completely ambulatory and does not just lay with pressure on this area. She also is able to roll and reposition in bed without complication. For that reason she really has no need for a group 2 mattress at this point whatsoever this would be a complete waste of resources as far as insurance and medical equipment is concerned. As far as her urinary incontinence she has no issues with incontinence at this point she is able to carry herself to the bathroom when needed she does wear a depends just in case but again that is not a routinely utilized item. 06-05-2022 upon evaluation today patient appears to be doing decently well in regard to her wound I do not see any signs of infection overall she seems to be doing quite well. I do think that she is making good progress in general here. We do have the wound VAC however and I think this is  going to potentially help to get this. Hand has been the biggest issue we have had is getting this to actually feeling more effectively. Objective Constitutional Well-nourished and well-hydrated in no acute distress. Vitals Time Taken: 12:29 PM, Height: 64 in, Weight: 97 lbs, BMI: 16.6, Temperature: 97.9 F, Pulse: 64 bpm, Respiratory Rate: 18 breaths/min, Blood Pressure: 151/85 mmHg. Respiratory normal breathing without difficulty. Psychiatric this patient is able to  make decisions and demonstrates good insight into disease process. Alert and Oriented x 3. pleasant and cooperative. General Notes: Upon inspection patient's wound bed actually showed signs of good granulation epithelization at this point. Fortunately I do not see any evidence of active infection locally nor systemically which is great news and overall I am extremely pleased with where we stand today. Integumentary (Hair, Skin) Wound #1 status is Open. Original cause of wound was Pressure Injury. The date acquired was: 10/09/2021. The wound has been in [facility] 22 weeks. The wound is located on the Sacrum. The wound measures 0.4cm length x 0.4cm width x 1cm depth; 0.126cm^2 area and 0.126cm^3 volume. There is a medium amount of serosanguineous drainage noted. Assessment Active Problems ICD-10 Pressure ulcer of sacral region, stage 4 Muscle weakness (generalized) Unspecified severe protein-calorie malnutrition Marissa, FRANCIOSA Cardenas ([id number]) [phone].pdf Page 6 of 7 Plan Follow-up Appointments: Return Appointment in 1 week. - w/ Margarita Grizzle ST and [place] Rm # 9 WEdnesday 06/05/22 @ 1:30 one Nurse Visit: - Friday 05/31/22 @ 9:00 Rm # 7 and Monday 06/03/22 @ 11:00 Other: - Administrator Ar[facility] Ped) : [phone] PCR culture negative-no [employer] ordered. Regular culture negative as of 05/15/22 WE are ordering an x-ray for pt. We are sending the prescription for it with pt. It can be done at any [facility] facility. NO appointment necessary. Just take prescription in and hand it to front desk. Anesthetic: (In clinic) Topical Lidocaine 5% applied to wound bed Negative Presssure Wound Therapy: Wound Vac to wound continuously at 161mm/hg pressure - Apply gauze wound vac kit to wound. Change 3 x a week. Apply gentamicin first to wound bed. Black and White Foam combination - gauze kit medela wound vac- order through wound Q Off-Loading: Turn and reposition every 2 hours WOUND #1: -  Sacrum Wound Laterality: Cleanser: Wound Cleanser (Home Health) 3 x Per Week/30 Days Discharge Instructions: Cleanse the wound with wound cleanser prior to applying a clean dressing using gauze sponges, not tissue or cotton balls. Peri-Wound Care: Skin Prep (Home Health) 3 x Per Week/30 Days Discharge Instructions: Use skin prep as directed Topical: Gentamicin 3 x Per Week/30 Days Discharge Instructions: As directed by physician 1. I would recommend currently that we go ahead and have the patient continue to monitor for any signs of infection or worsening based on what I am seeing I think that the gauze back is can be a good way to go. 2. Also can recommend that we go ahead and utilize the gentamicin prior to applying the gauze I think this is still an appropriate way to go. 3. I am also going to suggest that the patient should continue to offload is much as possible. We will see patient back for reevaluation in 1 week here in the clinic. If anything worsens or changes patient will contact our office for additional recommendations. Electronic Signature(s) Signed: 06/05/2022 1:49:46 PM By: Marissa Keeler [facility]as Entered By: [name] on 06/05/2022 13:49:46 -------------------------------------------------------------------------------- SuperBill Details Patient Name: Date of Service: Marissa [name] 06/05/2022 Medical Record Number: [id number] Patient Account  Number: [id number] Date of Birth/Sex: Treating RN: 06-26-1950 (72 y.o. F) Primary Care Provider: [name], Herbie Cardenas Other Clinician: Referring Provider: Treating Provider/Extender: [name]: 22 Diagnosis Coding ICD-10 Codes Code Description [phone] Pressure ulcer of sacral region, stage 4 M62.81 Muscle weakness (generalized) E43 Unspecified severe protein-calorie malnutrition Facility Procedures : CPT4 Code: [id number] Description: [id number] NEG PRESS WND TX <=50 SQ  CM Modifier: Quantity: 1 Physician Procedures : CPT4 Code Description Modifier [id number] - WC PHYS LEVEL 3 - EST PT ICD-10 Diagnosis Description L89.154 Pressure ulcer of sacral region, stage 4 M62.81 Muscle weakness (generalized) E43 Unspecified severe protein-calorie malnutrition Quantity: 1 Electronic Signature(s) Signed: 06/07/2022 2:14:59 PM By: Marissa Keeler C[facility]as Signed: 06/12/2022 4:26:45 PM By: [name] Previous Signature: 06/05/2022 1:50:02 PM Version By: Irean Hong Leonard, Irena Cardenas ([id number]) [id number] Page 7 of 7 Previous Signature: 06/05/2022 1:50:02 PM Version By: Marissa Keeler [facility]as Entered By: [name] on 06/05/2022 15:44:18

## 2022-06-07 ENCOUNTER — Encounter (HOSPITAL_BASED_OUTPATIENT_CLINIC_OR_DEPARTMENT_OTHER): Payer: 59 | Admitting: Internal Medicine

## 2022-06-07 DIAGNOSIS — K219 Gastro-esophageal reflux disease without esophagitis: Secondary | ICD-10-CM | POA: Diagnosis not present

## 2022-06-07 DIAGNOSIS — L89154 Pressure ulcer of sacral region, stage 4: Secondary | ICD-10-CM | POA: Diagnosis not present

## 2022-06-07 DIAGNOSIS — L89153 Pressure ulcer of sacral region, stage 3: Secondary | ICD-10-CM | POA: Diagnosis not present

## 2022-06-07 DIAGNOSIS — E43 Unspecified severe protein-calorie malnutrition: Secondary | ICD-10-CM | POA: Diagnosis not present

## 2022-06-07 DIAGNOSIS — K224 Dyskinesia of esophagus: Secondary | ICD-10-CM | POA: Diagnosis not present

## 2022-06-07 DIAGNOSIS — K449 Diaphragmatic hernia without obstruction or gangrene: Secondary | ICD-10-CM | POA: Diagnosis not present

## 2022-06-07 NOTE — Progress Notes (Signed)
NATEA, TAMEZ ([id number]) [id number] Page 1 of 3 Visit Report for 06/07/2022 Arrival Information Details Patient Name: Date of Service: [name]. 06/07/2022 11:00 A M Medical Record Number: [id number] Patient Account Number: [id number] Date of Birth/Sex: Treating RN: [birth year] (72 y.o. F) Primary Care Nyonna Hargrove: [name], [name] Other Clinician: Referring Jerrine Urschel: Treating Jadarius Commons/Extender: Dalene Seltzer in Treatment: 36 Visit Information History Since Last Visit Added or deleted any medications: No Patient Arrived: Ambulatory Any new allergies or adverse reactions: No Arrival Time: 11:00 Had a fall or experienced change in No Accompanied By: self activities of daily living that may affect Transfer Assistance: None risk of falls: Patient Identification Verified: Yes Signs or symptoms of abuse/neglect since last visito No Secondary Verification Process Completed: Yes Hospitalized since last visit: No Patient Requires Transmission-Based Precautions: No Implantable device outside of the clinic excluding No Patient Has Alerts: No cellular tissue based products placed in the center since last visit: Has Dressing in Place as Prescribed: Yes Pain Present Now: No Electronic Signature(s) Signed: 06/07/2022 1:15:54 PM By: Erenest Blank Entered By: Erenest Blank on 06/07/2022 13:02:09 -------------------------------------------------------------------------------- Encounter Discharge Information Details Patient Name: Date of Service: Marissa Perking Cardenas. 06/07/2022 11:00 A M Medical Record Number: [id number] Patient Account Number: [id number] Date of Birth/Sex: Treating RN: 1950/08/30 (72 y.o. F) Primary Care Layn Kye: [name], Eloy End Clinician: Erenest Blank Referring Rithvik Orcutt: Treating Adriauna Campton/Extender: Genice Rouge., [name] in Treatment: 22 Encounter Discharge Information  Items Discharge Condition: Stable Ambulatory Status: Ambulatory Discharge Destination: Home Transportation: Private Auto Accompanied By: self Schedule Follow-up Appointment: Yes Clinical Summary of Care: Electronic Signature(s) Signed: 06/07/2022 1:15:54 PM By: Erenest Blank Entered By: Erenest Blank on 06/07/2022 13:05:53 -------------------------------------------------------------------------------- Negative Pressure Wound Therapy Maintenance (NPWT) Details Patient Name: Date of Service: [name] 06/07/2022 11:00 A M Medical Record Number: [id number] Patient Account Number: [id number] Date of Birth/Sex: Treating RN: 1950-12-11 (72 y.o. F) Primary Care Amyrah Pinkhasov: [name], [name] Other Clinician: Referring Dory Demont: Treating Kemari Narez/Extender: Dalene Seltzer in Treatment: 22 NPWT Maintenance Performed for: Wound #1 Sacrum Performed By: Erenest Blank, Type: VAC System Coverage Size (sq cm): 0.16 [name], [name] ([id number]) [id number] Page 2 of 3 Pressure Type: Constant Pressure Setting: 125 mmHG Drain Type: None Primary Contact: Other : Sponge/Dressing Type: Foam- Black,Gauze Date Initiated: 06/05/2022 Quantity of Sponges/Gauze Removed: 1 gauze, black foam Canister Exudate Volume: 30 Quantity of Sponges/Gauze Inserted: 1 gauze, 1 black foam for bridge Days On NPWT : 3 Electronic Signature(s) Signed: 06/07/2022 1:15:54 PM By: Erenest Blank Entered By: Erenest Blank on 06/07/2022 13:04:36 -------------------------------------------------------------------------------- Patient/Caregiver Education Details Patient Name: Date of Service: [name] 3/29/2024andnbsp11:00 [place] Record Number: [id number] Patient Account Number: [id number] Date of Birth/Gender: Treating RN: [birth year] (72 y.o. F) Primary Care Physician: [name], [name] Other Clinician: Erenest Blank Referring  Physician: Treating Physician/Extender: Genice Rouge., [name] in Treatment: 22 Education Assessment Education Provided To: Patient Education Topics Provided Electronic Signature(s) Signed: 06/07/2022 1:15:54 PM By: Erenest Blank Entered By: Erenest Blank on 06/07/2022 13:05:35 -------------------------------------------------------------------------------- Wound Assessment Details Patient Name: Date of Service: Marissa [name]. 06/07/2022 11:00 A M Medical Record Number: [id number] Patient Account Number: [id number] Date of Birth/Sex: Treating RN: 1950/11/23 (72 y.o. F) Primary Care Nilesh Stegall: [name], Eloy End Clinician: Erenest Blank Referring Ifrah Vest: Treating Mekhi Sonn/Extender: Genice Rouge., [name] in Treatment: 22 Wound Status Wound Number: 1 Primary Etiology: Pressure  Ulcer Wound Location: Sacrum Wound Status: Open Wounding Event: Pressure Injury Comorbid History: Colitis Date Acquired: 10/09/2021 Weeks Of Treatment: 22 Clustered Wound: No Wound Measurements Length: (cm) 0.4 Width: (cm) 0.4 Depth: (cm) 1 Area: (cm) 0.126 Volume: (cm) 0.126 % Reduction in Area: 64.3% % Reduction in Volume: 49% Wound Description Classification: Category/Stage IV Exudate Amount: Medium Exudate Type: Serosanguineous Exudate Color: red, brown [name], [name] ([id number]) Periwound Skin Texture Texture No Abnormalities Noted: No Moisture No Abnormalities Noted: No [id number] Page 3 of 3 Color No Abnormalities Noted: No Treatment Notes Wound #1 (Sacrum) Cleanser Wound Cleanser Discharge Instruction: Cleanse the wound with wound cleanser prior to applying a clean dressing using gauze sponges, not tissue or cotton balls. Peri-Wound Care Skin Prep Discharge Instruction: Use skin prep as directed Topical Gentamicin Discharge Instruction: As directed by physician Primary Dressing Secondary  Dressing Secured With Compression Wrap Compression Stockings Add-Ons Electronic Signature(s) Signed: 06/07/2022 1:15:54 PM By: Erenest Blank Entered By: Erenest Blank on 06/07/2022 13:02:40 -------------------------------------------------------------------------------- [place] Details Patient Name: Date of Service: Marissa Perking Cardenas. 06/07/2022 11:00 A M Medical Record Number: [id number] Patient Account Number: [id number] Date of Birth/Sex: Treating RN: [birth year] (72 y.o. F) Primary Care Yanique Mulvihill: [name], [name] Other Clinician: Referring Jakel Alphin: Treating Jaycey Gens/Extender: Dalene Seltzer in Treatment: 22 Vital Signs Time Taken: 11:00 Reference Range: 80 - 120 mg / dl Height (in): 64 Weight (lbs): 97 Body Mass Index (BMI): 16.6 Electronic Signature(s) Signed: 06/07/2022 1:15:54 PM By: Erenest Blank Entered By: Erenest Blank on 06/07/2022 13:02:21

## 2022-06-10 ENCOUNTER — Encounter (HOSPITAL_BASED_OUTPATIENT_CLINIC_OR_DEPARTMENT_OTHER): Payer: 59 | Attending: General Surgery | Admitting: General Surgery

## 2022-06-10 DIAGNOSIS — Z681 Body mass index (BMI) 19 or less, adult: Secondary | ICD-10-CM | POA: Insufficient documentation

## 2022-06-10 DIAGNOSIS — M6281 Muscle weakness (generalized): Secondary | ICD-10-CM | POA: Insufficient documentation

## 2022-06-10 DIAGNOSIS — L89154 Pressure ulcer of sacral region, stage 4: Secondary | ICD-10-CM | POA: Insufficient documentation

## 2022-06-10 DIAGNOSIS — L89153 Pressure ulcer of sacral region, stage 3: Secondary | ICD-10-CM | POA: Insufficient documentation

## 2022-06-10 DIAGNOSIS — E43 Unspecified severe protein-calorie malnutrition: Secondary | ICD-10-CM | POA: Diagnosis not present

## 2022-06-11 DIAGNOSIS — L89154 Pressure ulcer of sacral region, stage 4: Secondary | ICD-10-CM | POA: Insufficient documentation

## 2022-06-11 NOTE — Progress Notes (Signed)
AVAREE, DECAROLIS ([id number]) [id number] Page 1 of 1 Visit Report for 06/10/2022 SuperBill Details Patient Name: Date of Service: [name] 06/10/2022 Medical Record Number: [id number] Patient Account Number: [id number] Date of Birth/Sex: Treating RN: [birth year] (72 y.o. [name], Meta.Reding Primary Care Provider: [name], [name] Other Clinician: Referring Provider: Treating Provider/Extender: [name], [name] in Treatment: 22 Diagnosis Coding ICD-10 Codes Code Description [phone] Pressure ulcer of sacral region, stage 4 M62.81 Muscle weakness (generalized) E43 Unspecified severe protein-calorie malnutrition Facility Procedures CPT4 Code Description Modifier Quantity [id number] - WOUND VAC-50 SQ CM OR LESS 1 ICD-10 Diagnosis Description L89.154 Pressure ulcer of sacral region, stage 4 Physician Procedures Quantity CPT4 Code Description Modifier [id number] - WC PHYS TX WOUND VAC < 50 SQ CM 1 ICD-10 Diagnosis Description L89.154 Pressure ulcer of sacral region, stage 4 Electronic Signature(s) Signed: 06/10/2022 1:27:39 PM By: Fredirick Maudlin MD FACS Signed: 06/11/2022 4:09:50 PM By: Deon Pilling RN, BSN Entered By: Deon Pilling on 06/10/2022 12:34:13

## 2022-06-11 NOTE — Progress Notes (Signed)
JANUITA, Cardenas ([id number]) [id number] Page 1 of 3 Visit Report for 06/10/2022 Arrival Information Details Patient Name: Date of Service: [name]. 06/10/2022 12:00 PM Medical Record Number: [id number] Patient Account Number: [id number] Date of Birth/Sex: Treating RN: 1950/09/30 (72 y.o. [name], Meta.Reding Primary Care Makye Radle: [name], [name] Other Clinician: Referring Zeynep Fantroy: Treating Armel Rabbani/Extender: [name], [name] in Treatment: 25 Visit Information History Since Last Visit Added or deleted any medications: No Patient Arrived: Ambulatory Any new allergies or adverse reactions: No Arrival Time: 12:15 Had a fall or experienced change in No Accompanied By: self activities of daily living that may affect Transfer Assistance: None risk of falls: Patient Identification Verified: Yes Signs or symptoms of abuse/neglect since last visito No Secondary Verification Process Completed: Yes Hospitalized since last visit: No Patient Requires Transmission-Based Precautions: No Implantable device outside of the clinic excluding No Patient Has Alerts: No cellular tissue based products placed in the center since last visit: Has Dressing in Place as Prescribed: Yes Pain Present Now: No Electronic Signature(s) Signed: 06/11/2022 4:09:50 PM By: Deon Pilling RN, BSN Entered By: Deon Pilling on 06/10/2022 12:32:07 -------------------------------------------------------------------------------- Encounter Discharge Information Details Patient Name: Date of Service: Marissa Perking R. 06/10/2022 12:00 PM Medical Record Number: [id number] Patient Account Number: [id number] Date of Birth/Sex: Treating RN: 08-27-1950 (72 y.o. [name], Meta.Reding Primary Care Shunda Rabadi: [name], [name] Other Clinician: Referring Marcel Sorter: Treating Byrne Capek/Extender: [name], [name] in Treatment: 22 Encounter Discharge  Information Items Discharge Condition: Stable Ambulatory Status: Ambulatory Discharge Destination: Home Transportation: Private Auto Accompanied By: self Schedule Follow-up Appointment: Yes Clinical Summary of Care: Electronic Signature(s) Signed: 06/11/2022 4:09:50 PM By: Deon Pilling RN, BSN Entered By: Deon Pilling on 06/10/2022 12:34:08 -------------------------------------------------------------------------------- Negative Pressure Wound Therapy Maintenance (NPWT) Details Patient Name: Date of Service: Marissa Perking R. 06/10/2022 12:00 PM Medical Record Number: [id number] Patient Account Number: [id number] Date of Birth/Sex: Treating RN: [birth year] (72 y.o. [name], [name] Primary Care Tyger Wichman: [name], [name] Other Clinician: Referring Gaither Biehn: Treating Chue Berkovich/Extender: [name], [name] in Treatment: 22 NPWT Maintenance Performed for: Wound #1 Sacrum Performed By: Deon Pilling, RN Type: VAC System Coverage Size (sq cm): 0.04 Cardenas, Marissa R ([id number]) [id number] Page 2 of 3 Pressure Type: Constant Pressure Setting: 125 mmHG Drain Type: None Primary Contact: Other : gauze vac Sponge/Dressing Type: Foam- Black,Gauze Date Initiated: 06/05/2022 Dressing Removed: Yes Quantity of Sponges/Gauze Removed: x1 white gauze and x1 black foam Canister Changed: No Canister Exudate Volume: 0 Dressing Reapplied: Yes Quantity of Sponges/Gauze Inserted: x1 2x2 gauze; x1 black foam bridge to right hip. Respones T Treatment: o tolerated well Days On NPWT : 6 Electronic Signature(s) Signed: 06/11/2022 4:09:50 PM By: Deon Pilling RN, BSN Entered By: Deon Pilling on 06/10/2022 12:33:43 -------------------------------------------------------------------------------- Wound Assessment Details Patient Name: Date of Service: [name], Marissa Pew R. 06/10/2022 12:00 PM Medical Record Number: [id number] Patient Account Number:  [id number] Date of Birth/Sex: Treating RN: 1950/07/22 (72 y.o. [name], Meta.Reding Primary Care [name]: [name], [name] Other Clinician: Referring [name]: Treating Elener Custodio/Extender: [name], [name] in Treatment: 22 Wound Status Wound Number: 1 Primary Etiology: Pressure Ulcer Wound Location: Sacrum Wound Status: Open Wounding Event: Pressure Injury Date Acquired: 10/09/2021 Weeks Of Treatment: 22 Clustered Wound: No Wound Measurements Length: (cm) 0.2 Width: (cm) 0.2 Depth: (cm) 1 Area: (cm) 0.031 Volume: (cm) 0.031 % Reduction in Area: 91.2% % Reduction in Volume: 87.4% Wound Description Classification: Category/Stage  IV Exudate Amount: Medium Exudate Type: Serosanguineous Exudate Color: red, brown Periwound Skin Texture Texture Color No Abnormalities Noted: No No Abnormalities Noted: No Moisture No Abnormalities Noted: No Treatment Notes Wound #1 (Sacrum) Cleanser Wound Cleanser Discharge Instruction: Cleanse the wound with wound cleanser prior to applying a clean dressing using gauze sponges, not tissue or cotton balls. Peri-Wound Care Skin Prep Discharge Instruction: Use skin prep as directed Topical Gentamicin LAJUANDA, YAFFE R ([id number]) [id number] Page 3 of 3 Discharge Instruction: As directed by physician Primary Dressing Secondary Dressing Secured With Compression Wrap Compression Stockings Add-Ons Electronic Signature(s) Signed: 06/11/2022 4:09:50 PM By: Deon Pilling RN, BSN Entered By: Deon Pilling on 06/10/2022 12:32:18

## 2022-06-12 ENCOUNTER — Encounter (HOSPITAL_BASED_OUTPATIENT_CLINIC_OR_DEPARTMENT_OTHER): Payer: 59 | Admitting: Physician Assistant

## 2022-06-12 DIAGNOSIS — L89154 Pressure ulcer of sacral region, stage 4: Secondary | ICD-10-CM | POA: Diagnosis not present

## 2022-06-12 DIAGNOSIS — E43 Unspecified severe protein-calorie malnutrition: Secondary | ICD-10-CM | POA: Diagnosis not present

## 2022-06-12 DIAGNOSIS — L89153 Pressure ulcer of sacral region, stage 3: Secondary | ICD-10-CM | POA: Diagnosis not present

## 2022-06-12 DIAGNOSIS — Z681 Body mass index (BMI) 19 or less, adult: Secondary | ICD-10-CM | POA: Diagnosis not present

## 2022-06-12 DIAGNOSIS — M6281 Muscle weakness (generalized): Secondary | ICD-10-CM | POA: Diagnosis not present

## 2022-06-12 NOTE — Progress Notes (Addendum)
ASANI, UNSWORTH Cardenas ([id number]) [id number] Page 1 of 7 Visit Report for 06/12/2022 Chief Complaint Document Details Patient Name: Date of Service: [name] 06/12/2022 10:15 A M Medical Record Number: [id number] Patient Account Number: [id number] Date of Birth/Sex: Treating RN: 1951-01-27 (72 y.o. F) Primary Care Provider: [name], [name] Other Clinician: Referring Provider: Treating Provider/Extender: [name] in Treatment: 23 Information Obtained from: Patient Chief Complaint Pressure ulcer stage 3 Sacrum Electronic Signature(s) Signed: 06/12/2022 10:15:39 AM By: Worthy Keeler PA-C Entered By: Worthy Keeler on 06/12/2022 10:15:38 -------------------------------------------------------------------------------- HPI Details Patient Name: Date of Service: CO Marissa Compton Cardenas. 06/12/2022 10:15 A M Medical Record Number: [id number] Patient Account Number: [id number] Date of Birth/Sex: Treating RN: 1950-11-01 (72 y.o. F) Primary Care Provider: [name], [name] Other Clinician: Referring Provider: Treating Provider/Extender: [name] in Treatment: 23 History of Present Illness HPI Description: 01-02-22 upon evaluation today patient presents for initial inspection here in our clinic concerning a wound that she has over the sacral region. This is stated to be present since around the beginning of August 2023. She currently resides in a assisted nursing facility. She does seem to be able to answer questions fully today. Subsequently I do note that she is a little on the thin side but again other than the protein calorie malnutrition she is minimally weak but still does get up and move around some but she also tells me that she "sits a lot". She has not had any x-rays of the sacral region at this point. 01-09-2022 upon evaluation today patient's wound in the sacral area actually appears to be  doing decently well. Fortunately I do not see any signs of infection which is great news and overall I am extremely pleased with where things stand today. 11/8; this is a patient who lives in some form of small assisted living in [place]. She has a deep wound over the lower part of her coccyx. An x-ray that we ordered from last time did not show any osseous abnormalities. We are supposed to be using Hydrofera Blue in the wound but I am really not sure what they are using to dress this and who is doing it. Talking to our staff they have apparently discussed this with the staff in the facility. She does not have home health. 01-23-2022 upon evaluation today patient appears to be doing decently well in regard to her wound. She has been tolerating the dressing changes without complication although I am not certain the dressing changes have been done appropriately over the past couple of weeks. We have been trying to get her to come in here we also try to get her into a wound care center in [place] which would be closer for the assisted living facility. Unfortunately neither 1 of those were undertaken up to this point. The patient did end up having some training of the staff from an RN that they brought him to have the staff perform the dressing changes but that being said it does not sound like this has been done correctly. Nonetheless I do believe that based on what we see it would benefit the patient to actually have her come here 3 times a week also think a wound VAC could potentially be a possibility for her which would help to get things moving in a much better direction as far as healing is concerned. We need to get this to fill-in though it looks  clean and does not look infected I do think that we need to really get this to fill-in and there is quite a bit of undermining that is to be considered here. 01-30-2022 upon evaluation today patient appears to be doing well currently in regard to her wound  which is looking pretty decent but still has quite a bit of space underneath as far as undermining is concerned. I think she might possibly be better with a wound VAC. With that being said if when I do this we probably only be able to do it 2 times a week at most. I discussed that with the patient today she is okay with that we just need to see if we can get the insurance approval and then of course the scheduling underway. 02-13-2022 upon evaluation today patient appears to be doing well currently in regard to her wound. She actually tells me that it is feeling a lot better which is good news. Fortunately I do not see any signs of active infection at this time. No fevers, chills, nausea, vomiting, or diarrhea. 02-27-2022 upon evaluation today patient appears to be doing well currently in regard to her wound. She is actually showing some signs of improvement we are still obtaining the approval for the snap VAC which I think could be beneficial in the meantime we been using the Hydrofera Blue rope. 03-27-2022 upon evaluation today patient appears to be doing okay in regard to her wound but there was no dressing in place upon arrival today. Fortunately I see again no evidence of infection. No fevers, chills, nausea, vomiting, or diarrhea. 04-03-2022 upon evaluation today patient did not have a dressing on when she came into the office. With that being said she actually tells me that it was on until yesterday she took a shower and it came loose and then today came off completely. With that being said I really do not think she needs to be taking shower every single day or having to see her here in the clinic 3 times per week on Monday, Wednesday, and Friday and during those times obviously in between she needs to probably avoid showering. For that reason what I advised her to do would be to shower in the mornings on the days that she comes to see [place] that way if the dressing does come off or start to come off  we will be changing it anyway. She voiced understanding and tells me she can do that. Unfortunately the facility has nobody to help her with changing the dressings and we are not currently having to do that here in the clinic again. This means [name], [name] ([id number]) [id number] Page 2 of 7 that during the time that she was not coming in from December 20 through when I saw her last week on the 17th there was a period of 2 weeks where the wound really was not changed at all as the RN that Myriam Jacobson tell me they had at the facility did not end up working out. This obviously is not good and is not what we expect to see as far as patient care is concerned which is why we are now seeing her 3 times a week here in the clinic. 04-10-2022 upon evaluation today patient appears to be doing well currently in regard to her wound which is actually showing signs of good granulation epithelization at this point. However she does have quite a bit of drainage and we are little concerned about the possibility of  infection. For that reason I think she could benefit from a PCR culture followed by [facility] topical antibiotics. I am also thinking of going ahead and doing gentamicin today as well. 2/7; small wound on the lower sacrum however with considerable degree of undermining from roughly 7-4. PCR culture that was done last week showed "no organisms". We have been using gentamicin and iodoform packing.She lives in some form of group home in [place] I believe. Uncertain about the adequacy of offloading this area 04-24-2022 upon evaluation patient's wound actually is showing signs of doing decently well I do believe she would benefit from a snap VAC and we discussed that again today. Fortunately I do not see any signs of active infection locally nor systemically which is great news. No fevers, chills, nausea, vomiting, or diarrhea. 05-01-2022 upon evaluation today patient's wound is really  doing about the same. Fortunately I do not see any signs of active infection locally nor systemically at this time which is great news with that being said we have gotten approval for the snap VAC and we will going to go ahead and place that today. 05-08-2022 upon evaluation today patient appears to be doing well currently in regard to her wound all things considered but were having a difficult time with a snap VAC suctioning appropriately. With that being said we will get a go ahead and likely avoid the snap VAC at this point at least until we can get the actual brace dressing as bridging ourselves does not seem to be working nearly as well. 3/6; I note the difficulty with a snap VAC which is disappointing but apparently another type of VAC is being considered. Also a referral to plastic surgery at [facility]. The patient has a very small but deep and at the base circumferential undermining. She is coming in here now 3 times a week for [facility] placement 3/13; patient presents for follow-up. She has been using Hydrofera Blue 3 times a week. She comes into our clinic for nurse visits to have this done. We are still awaiting a wound VAC. She has no issues or complaints today. 05-29-2022 upon evaluation today patient appears to be doing about the same in regard to her wound. She still has significant undermining although the opening is a huge the undermining is significant. I do believe that a gauze VAC would be beneficial here. The patient does not eat normally and in fact tells me that she eats very well. She also is doing supplements with Juven at this point. She also has a good ability to get up and move around she is completely ambulatory and does not just lay with pressure on this area. She also is able to roll and reposition in bed without complication. For that reason she really has no need for a group 2 mattress at this point whatsoever this would be a complete waste of resources as far as  insurance and medical equipment is concerned. As far as her urinary incontinence she has no issues with incontinence at this point she is able to carry herself to the bathroom when needed she does wear a depends just in case but again that is not a routinely utilized item. 06-05-2022 upon evaluation today patient appears to be doing decently well in regard to her wound I do not see any signs of infection overall she seems to be doing quite well. I do think that she is making good progress in general here. We do have the wound VAC however and  I think this is going to potentially help to get this. Hand has been the biggest issue we have had is getting this to actually feeling more effectively. 06-12-2022 upon evaluation today patient appears to be doing okay in regard to her wound. We are still seeing some signs of improvement. Fortunately there does not appear to be any evidence of active infection at this time which is good news she did see the doctors over at [facility] yesterday and they are ordering an MRI in preparation for considering possibility of a surgical closure. The patient voiced understanding she had forgotten we have made this referral. T be o honest I was back in February kind of forgot about the referral to in the interim trying to proceed with her wound care. Nonetheless I appreciate their be evaluation of this and again we will see what the MRI shows. Electronic Signature(s) Signed: 06/12/2022 1:52:04 PM By: Worthy Keeler PA-C Entered By: Worthy Keeler on 06/12/2022 13:52:04 -------------------------------------------------------------------------------- Physical Exam Details Patient Name: Date of Service: CO Marissa Compton Cardenas. 06/12/2022 10:15 A M Medical Record Number: [id number] Patient Account Number: [id number] Date of Birth/Sex: Treating RN: 06-Dec-1950 (72 y.o. F) Primary Care Provider: [name], [name] Other Clinician: Referring Provider: Treating Provider/Extender: [name] in Treatment: 23 Constitutional Well-nourished and well-hydrated in no acute distress. Respiratory normal breathing without difficulty. Psychiatric this patient is able to make decisions and demonstrates good insight into disease process. Alert and Oriented x 3. pleasant and cooperative. Notes Upon inspection patient's wound bed actually showed signs of good granulation and some areas she still has some undermining however using the gauze back which seems to be doing a pretty good job. She is tolerating the dressing changes without complication. Electronic Signature(s) Signed: 06/12/2022 1:52:18 PM By: Worthy Keeler PA-C Entered By: Worthy Keeler on 06/12/2022 13:52:18 [name] ([id number]) [id number] Page 3 of 7 -------------------------------------------------------------------------------- Physician Orders Details Patient Name: Date of Service: [name] 06/12/2022 10:15 A M Medical Record Number: [id number] Patient Account Number: [id number] Date of Birth/Sex: Treating RN: 1950/10/05 (72 y.o. [name] Primary Care Provider: [name], [name] Other Clinician: Referring Provider: Treating Provider/Extender: [name] in Treatment: 23 Verbal / Phone Orders: No Diagnosis Coding ICD-10 Coding Code Description L89.154 Pressure ulcer of sacral region, stage 4 M62.81 Muscle weakness (generalized) E43 Unspecified severe protein-calorie malnutrition Follow-up Appointments ppointment in 1 week. [facility] [facility] and [place] Rm # 9 WEdnesday 06/19/22 @ 1:15pm one Return A Nurse Visit: - Friday 06/14/22 @ 7:30 Dr. Glenford Peers side w/ Mechele Claude Rm # 3 Monday 06/17/22 @ 9:00 am Friday 06/21/22 @ 9:45 am Other: - Administrator Arbutus Ped) : [id number] PCR culture negative-no [employer] ordered. Regular culture negative as of 05/15/22 WE are ordering an x-ray for pt. We are sending  the prescription for it with pt. It can be done at any [facility] facility. NO appointment necessary. Just take prescription in and hand it to front desk. Anesthetic (In clinic) Topical Lidocaine 5% applied to wound bed Negative Presssure Wound Therapy Wound Vac to wound continuously at 114mm/hg pressure - Apply gauze wound vac kit to wound. Change 3 x a week. Apply gentamicin first to wound bed. Black and White Foam combination - gauze kit medela wound vac- order through wound Q Off-Loading Turn and reposition every 2 hours Wound Treatment Wound #1 - Sacrum Cleanser: Wound Cleanser (Home Health) 3 x Per Week/30  Days Discharge Instructions: Cleanse the wound with wound cleanser prior to applying a clean dressing using gauze sponges, not tissue or cotton balls. Peri-Wound Care: Skin Prep ([place]) 3 x Per Week/30 Days Discharge Instructions: Use skin prep as directed Topical: Gentamicin 3 x Per Week/30 Days Discharge Instructions: As directed by physician Electronic Signature(s) Signed: 06/12/2022 3:49:12 PM By: Sharyn Creamer RN, BSN Signed: 06/12/2022 4:14:50 PM By: Worthy Keeler PA-C Entered By: Sharyn Creamer on 06/12/2022 11:22:01 -------------------------------------------------------------------------------- Problem List Details Patient Name: Date of Service: Marissa Perking Cardenas. 06/12/2022 10:15 A M Medical Record Number: [id number] Patient Account Number: [id number] Date of Birth/Sex: Treating RN: [birth year] (72 y.o. F) Primary Care Provider: [name], [name] Other Clinician: Referring Provider: Treating Provider/Extender: [name] in Treatment: [address] [name], [name] ([id number]) [id number] Page 4 of 7 ICD-10 Encounter Code Description Active Date MDM Diagnosis L89.154 Pressure ulcer of sacral region, stage 4 01/02/2022 No Yes M62.81 Muscle weakness (generalized) 01/02/2022 No Yes E43  Unspecified severe protein-calorie malnutrition 01/02/2022 No Yes Inactive Problems Resolved Problems Electronic Signature(s) Signed: 06/12/2022 10:15:31 AM By: Worthy Keeler PA-C Entered By: Worthy Keeler on 06/12/2022 10:15:31 -------------------------------------------------------------------------------- Progress Note Details Patient Name: Date of Service: CO Marissa Compton Cardenas. 06/12/2022 10:15 A M Medical Record Number: [id number] Patient Account Number: [id number] Date of Birth/Sex: Treating RN: 1951/01/02 (72 y.o. F) Primary Care Provider: [name], [name] Other Clinician: Referring Provider: Treating Provider/Extender: [name] in Treatment: 23 Subjective Chief Complaint Information obtained from Patient Pressure ulcer stage 3 Sacrum History of Present Illness (HPI) 01-02-22 upon evaluation today patient presents for initial inspection here in our clinic concerning a wound that she has over the sacral region. This is stated to be present since around the beginning of August 2023. She currently resides in a assisted nursing facility. She does seem to be able to answer questions fully today. Subsequently I do note that she is a little on the thin side but again other than the protein calorie malnutrition she is minimally weak but still does get up and move around some but she also tells me that she "sits a lot". She has not had any x-rays of the sacral region at this point. 01-09-2022 upon evaluation today patient's wound in the sacral area actually appears to be doing decently well. Fortunately I do not see any signs of infection which is great news and overall I am extremely pleased with where things stand today. 11/8; this is a patient who lives in some form of small assisted living in [place]. She has a deep wound over the lower part of her coccyx. An x-ray that we ordered from last time did not show any osseous abnormalities. We are supposed  to be using Hydrofera Blue in the wound but I am really not sure what they are using to dress this and who is doing it. Talking to our staff they have apparently discussed this with the staff in the facility. She does not have home health. 01-23-2022 upon evaluation today patient appears to be doing decently well in regard to her wound. She has been tolerating the dressing changes without complication although I am not certain the dressing changes have been done appropriately over the past couple of weeks. We have been trying to get her to come in here we also try to get her into a wound care center in [place] which would be closer for the  assisted living facility. Unfortunately neither 1 of those were undertaken up to this point. The patient did end up having some training of the staff from an RN that they brought him to have the staff perform the dressing changes but that being said it does not sound like this has been done correctly. Nonetheless I do believe that based on what we see it would benefit the patient to actually have her come here 3 times a week also think a wound VAC could potentially be a possibility for her which would help to get things moving in a much better direction as far as healing is concerned. We need to get this to fill-in though it looks clean and does not look infected I do think that we need to really get this to fill-in and there is quite a bit of undermining that is to be considered here. 01-30-2022 upon evaluation today patient appears to be doing well currently in regard to her wound which is looking pretty decent but still has quite a bit of space underneath as far as undermining is concerned. I think she might possibly be better with a wound VAC. With that being said if when I do this we probably only be able to do it 2 times a week at most. I discussed that with the patient today she is okay with that we just need to see if we can get the insurance approval and then  of course the scheduling underway. 02-13-2022 upon evaluation today patient appears to be doing well currently in regard to her wound. She actually tells me that it is feeling a lot better which is good news. Fortunately I do not see any signs of active infection at this time. No fevers, chills, nausea, vomiting, or diarrhea. 02-27-2022 upon evaluation today patient appears to be doing well currently in regard to her wound. She is actually showing some signs of improvement we are still obtaining the approval for the snap VAC which I think could be beneficial in the meantime we been using the Hydrofera Blue rope. 03-27-2022 upon evaluation today patient appears to be doing okay in regard to her wound but there was no dressing in place upon arrival today. Fortunately I see again no evidence of infection. No fevers, chills, nausea, vomiting, or diarrhea. 04-03-2022 upon evaluation today patient did not have a dressing on when she came into the office. With that being said she actually tells me that it was on [name], [name] ([id number]) [id number] Page 5 of 7 until yesterday she took a shower and it came loose and then today came off completely. With that being said I really do not think she needs to be taking shower every single day or having to see her here in the clinic 3 times per week on Monday, Wednesday, and Friday and during those times obviously in between she needs to probably avoid showering. For that reason what I advised her to do would be to shower in the mornings on the days that she comes to see [place] that way if the dressing does come off or start to come off we will be changing it anyway. She voiced understanding and tells me she can do that. Unfortunately the facility has nobody to help her with changing the dressings and we are not currently having to do that here in the clinic again. This means that during the time that she was not coming in from December 20  through when I saw her last week  on the 17th there was a period of 2 weeks where the wound really was not changed at all as the RN that Myriam Jacobson tell me they had at the facility did not end up working out. This obviously is not good and is not what we expect to see as far as patient care is concerned which is why we are now seeing her 3 times a week here in the clinic. 04-10-2022 upon evaluation today patient appears to be doing well currently in regard to her wound which is actually showing signs of good granulation epithelization at this point. However she does have quite a bit of drainage and we are little concerned about the possibility of infection. For that reason I think she could benefit from a PCR culture followed by [facility] topical antibiotics. I am also thinking of going ahead and doing gentamicin today as well. 2/7; small wound on the lower sacrum however with considerable degree of undermining from roughly 7-4. PCR culture that was done last week showed "no organisms". We have been using gentamicin and iodoform packing.She lives in some form of group home in [place] I believe. Uncertain about the adequacy of offloading this area 04-24-2022 upon evaluation patient's wound actually is showing signs of doing decently well I do believe she would benefit from a snap VAC and we discussed that again today. Fortunately I do not see any signs of active infection locally nor systemically which is great news. No fevers, chills, nausea, vomiting, or diarrhea. 05-01-2022 upon evaluation today patient's wound is really doing about the same. Fortunately I do not see any signs of active infection locally nor systemically at this time which is great news with that being said we have gotten approval for the snap VAC and we will going to go ahead and place that today. 05-08-2022 upon evaluation today patient appears to be doing well currently in regard to her wound all things considered but were having a  difficult time with a snap VAC suctioning appropriately. With that being said we will get a go ahead and likely avoid the snap VAC at this point at least until we can get the actual brace dressing as bridging ourselves does not seem to be working nearly as well. 3/6; I note the difficulty with a snap VAC which is disappointing but apparently another type of VAC is being considered. Also a referral to plastic surgery at [facility]. The patient has a very small but deep and at the base circumferential undermining. She is coming in here now 3 times a week for [facility] placement 3/13; patient presents for follow-up. She has been using Hydrofera Blue 3 times a week. She comes into our clinic for nurse visits to have this done. We are still awaiting a wound VAC. She has no issues or complaints today. 05-29-2022 upon evaluation today patient appears to be doing about the same in regard to her wound. She still has significant undermining although the opening is a huge the undermining is significant. I do believe that a gauze VAC would be beneficial here. The patient does not eat normally and in fact tells me that she eats very well. She also is doing supplements with Juven at this point. She also has a good ability to get up and move around she is completely ambulatory and does not just lay with pressure on this area. She also is able to roll and reposition in bed without complication. For that reason she really has no need for a group  2 mattress at this point whatsoever this would be a complete waste of resources as far as insurance and medical equipment is concerned. As far as her urinary incontinence she has no issues with incontinence at this point she is able to carry herself to the bathroom when needed she does wear a depends just in case but again that is not a routinely utilized item. 06-05-2022 upon evaluation today patient appears to be doing decently well in regard to her wound I do not see any  signs of infection overall she seems to be doing quite well. I do think that she is making good progress in general here. We do have the wound VAC however and I think this is going to potentially help to get this. Hand has been the biggest issue we have had is getting this to actually feeling more effectively. 06-12-2022 upon evaluation today patient appears to be doing okay in regard to her wound. We are still seeing some signs of improvement. Fortunately there does not appear to be any evidence of active infection at this time which is good news she did see the doctors over at [facility] yesterday and they are ordering an MRI in preparation for considering possibility of a surgical closure. The patient voiced understanding she had forgotten we have made this referral. T be o honest I was back in February kind of forgot about the referral to in the interim trying to proceed with her wound care. Nonetheless I appreciate their be evaluation of this and again we will see what the MRI shows. Objective Constitutional Well-nourished and well-hydrated in no acute distress. Vitals Time Taken: 10:41 AM, Height: 64 in, Weight: 97 lbs, BMI: 16.6, Temperature: 98.3 F, Pulse: 73 bpm, Respiratory Rate: 16 breaths/min, Blood Pressure: 110/69 mmHg. Respiratory normal breathing without difficulty. Psychiatric this patient is able to make decisions and demonstrates good insight into disease process. Alert and Oriented x 3. pleasant and cooperative. General Notes: Upon inspection patient's wound bed actually showed signs of good granulation and some areas she still has some undermining however using the gauze back which seems to be doing a pretty good job. She is tolerating the dressing changes without complication. Integumentary (Hair, Skin) Wound #1 status is Open. Original cause of wound was Pressure Injury. The date acquired was: 10/09/2021. The wound has been in treatment 23 weeks. The wound is located on the  Sacrum. The wound measures 0.3cm length x 0.2cm width x 1cm depth; 0.047cm^2 area and 0.047cm^3 volume. There is Fat Layer (Subcutaneous Tissue) exposed. There is undermining starting at 8:00 and ending at 11:00 with a maximum distance of 1.5cm. There is a medium amount of serosanguineous drainage noted. There is large (67-100%) red granulation within the wound bed. There is a small (1-33%) amount of necrotic tissue within the wound bed including Adherent Slough. [name], [name] ([id number]) [id number] Page 6 of 7 Assessment Active Problems ICD-10 Pressure ulcer of sacral region, stage 4 Muscle weakness (generalized) Unspecified severe protein-calorie malnutrition Plan Follow-up Appointments: Return Appointment in 1 week. - w/ Margarita Grizzle ST and [facility] Rm # 9 WEdnesday 06/19/22 @ 1:15pm one Nurse Visit: - Friday 06/14/22 @ 7:30 Dr. Glenford Peers side w/ Mechele Claude Rm # 3 Monday 06/17/22 @ 9:00 am Friday 06/21/22 @ 9:45 am Other: - Administrator Arbutus Ped) : [phone] PCR culture negative-no [employer] ordered. Regular culture negative as of 05/15/22 WE are ordering an x-ray for pt. We are sending the prescription for it with pt. It can be done at any [facility]  facility. NO appointment necessary. Just take prescription in and hand it to front desk. Anesthetic: (In clinic) Topical Lidocaine 5% applied to wound bed Negative Presssure Wound Therapy: Wound Vac to wound continuously at 161mm/hg pressure - Apply gauze wound vac kit to wound. Change 3 x a week. Apply gentamicin first to wound bed. Black and White Foam combination - gauze kit medela wound vac- order through wound Q Off-Loading: Turn and reposition every 2 hours WOUND #1: - Sacrum Wound Laterality: Cleanser: Wound Cleanser (Home Health) 3 x Per Week/30 Days Discharge Instructions: Cleanse the wound with wound cleanser prior to applying a clean dressing using gauze sponges, not tissue or cotton balls. Peri-Wound Care:  Skin Prep (Home Health) 3 x Per Week/30 Days Discharge Instructions: Use skin prep as directed Topical: Gentamicin 3 x Per Week/30 Days Discharge Instructions: As directed by physician 1. I would recommend currently that we have the patient continue to monitor for any signs of worsening or infection. Obviously based on what I am seeing. I do believe that we are making good progress along the right track. 2. I am also can recommend that the patient should continue to appropriately offload the sacral area were using the wound VAC but she still needs to stay off the sacral region and some gentamicin and then applying the gauze VAC at this point. We will see patient back for reevaluation in 1 week here in the clinic. If anything worsens or changes patient will contact our office for additional recommendations. Electronic Signature(s) Signed: 06/12/2022 1:53:15 PM By: Worthy Keeler PA-C Entered By: Worthy Keeler on 06/12/2022 13:53:15 -------------------------------------------------------------------------------- SuperBill Details Patient Name: Date of Service: CO Marissa Compton Cardenas. 06/12/2022 Medical Record Number: [id number] Patient Account Number: [id number] Date of Birth/Sex: Treating RN: Dec 16, 1950 (72 y.o. F) Primary Care Provider: [name], [name] Other Clinician: Referring Provider: Treating Provider/Extender: [name] in Treatment: 23 Diagnosis Coding ICD-10 Codes Code Description [phone] Pressure ulcer of sacral region, stage 4 M62.81 Muscle weakness (generalized) E43 Unspecified severe protein-calorie malnutrition Facility Procedures : CPT4 Code: [id number] Description: [id number] - WOUND VAC-50 SQ CM OR LESS Modifier: Quantity: 1 Physician Procedures : CPT4 Code Description Modifier [name], [name] ([id number]) [id number] Page 7 [id number] - WC PHYS LEVEL 4 - EST PT 1 ICD-10 Diagnosis Description L89.154 Pressure  ulcer of sacral region, stage 4 M62.81 Muscle weakness  (generalized) E43 Unspecified severe protein-calorie malnutrition Quantity: of 7 Electronic Signature(s) Signed: 06/12/2022 1:53:40 PM By: Worthy Keeler PA-C Entered By: Worthy Keeler on 06/12/2022 13:53:39

## 2022-06-12 NOTE — Progress Notes (Signed)
CHRISSI, FACENDA Cardenas ([id number]) [id number] Page 1 of 4 Visit Report for 06/03/2022 Arrival Information Details Patient Name: Date of Service: [name]. 06/03/2022 11:00 A M Medical Record Number: [id number] Patient Account Number: [id number] Date of Birth/Sex: Treating RN: 03/18/1950 (72 y.o. [name], [name] Primary Care Jens Siems: [name], [name] Other Clinician: Referring Orie Baxendale: Treating Navaeh Kehres/Extender: Genice Rouge., [name] in Treatment: 21 Visit Information History Since Last Visit Added or deleted any medications: No Patient Arrived: Ambulatory Any new allergies or adverse reactions: No Arrival Time: 10:34 Had a fall or experienced change in No Accompanied By: self activities of daily living that may affect Transfer Assistance: None risk of falls: Patient Identification Verified: Yes Signs or symptoms of abuse/neglect since last visito No Secondary Verification Process Completed: Yes Hospitalized since last visit: No Patient Requires Transmission-Based Precautions: No Implantable device outside of the clinic excluding No Patient Has Alerts: No cellular tissue based products placed in the center since last visit: Has Dressing in Place as Prescribed: Yes Pain Present Now: No Electronic Signature(s) Signed: 06/12/2022 4:26:45 PM By: [name] Entered By: [name] on 06/03/2022 10:34:37 -------------------------------------------------------------------------------- Clinic Level of Care Assessment Details Patient Name: Date of Service: [name] 06/03/2022 11:00 A M Medical Record Number: [id number] Patient Account Number: [id number] Date of Birth/Sex: Treating RN: [birth year] (71 y.o. [name], [name] Primary Care Ronelle Michie: [name], [name] Other Clinician: Referring Bobbiejo Ishikawa: Treating Jovan Schickling/Extender: Genice Rouge., [name] in Treatment: 21 Clinic  Level of Care Assessment Items TOOL 4 Quantity Score X- 1 0 Use when only an EandM is performed on FOLLOW-UP visit ASSESSMENTS - Nursing Assessment / Reassessment X- 1 10 Reassessment of Co-morbidities (includes updates in patient status) X- 1 5 Reassessment of Adherence to Treatment Plan ASSESSMENTS - Wound and Skin A ssessment / Reassessment X - Simple Wound Assessment / Reassessment - one wound 1 5 []  - 0 Complex Wound Assessment / Reassessment - multiple wounds []  - 0 Dermatologic / Skin Assessment (not related to wound area) ASSESSMENTS - Focused Assessment []  - 0 Circumferential Edema Measurements - multi extremities []  - 0 Nutritional Assessment / Counseling / Intervention []  - 0 Lower Extremity Assessment (monofilament, tuning fork, pulses) []  - 0 Peripheral Arterial Disease Assessment (using hand held doppler) ASSESSMENTS - Ostomy and/or Continence Assessment and Care []  - 0 Incontinence Assessment and Management []  - 0 Ostomy Care Assessment and Management (repouching, etc.) PROCESS - Coordination of Care X - Simple Patient / Family Education for ongoing care 1 15 [name], [name] ([id number]) [phone].pdf Page 2 of 4 []  - 0 Complex (extensive) Patient / Family Education for ongoing care X- 1 10 Staff obtains Programmer, systems, Records, T Results / Process Orders est []  - 0 Staff telephones HHA, Nursing Homes / Clarify orders / etc []  - 0 Routine Transfer to another Facility (non-emergent condition) []  - 0 Routine Hospital Admission (non-emergent condition) []  - 0 New Admissions / Biomedical engineer / Ordering NPWT Apligraf, etc. , []  - 0 Emergency Hospital Admission (emergent condition) X- 1 10 Simple Discharge Coordination []  - 0 Complex (extensive) Discharge Coordination PROCESS - Special Needs []  - 0 Pediatric / Minor Patient Management []  - 0 Isolation Patient Management []  - 0 Hearing / Language / Visual special needs []   - 0 Assessment of Community assistance (transportation, D/C planning, etc.) []  - 0 Additional assistance / Altered mentation []  - 0 Support Surface(s) Assessment (bed, cushion, seat, etc.) INTERVENTIONS - Wound Cleansing /  Measurement X - Simple Wound Cleansing - one wound 1 5 []  - 0 Complex Wound Cleansing - multiple wounds []  - 0 Wound Imaging (photographs - any number of wounds) []  - 0 Wound Tracing (instead of photographs) X- 1 5 Simple Wound Measurement - one wound []  - 0 Complex Wound Measurement - multiple wounds INTERVENTIONS - Wound Dressings X - Small Wound Dressing one or multiple wounds 1 10 []  - 0 Medium Wound Dressing one or multiple wounds []  - 0 Large Wound Dressing one or multiple wounds []  - 0 Application of Medications - topical []  - 0 Application of Medications - injection INTERVENTIONS - Miscellaneous []  - 0 External ear exam []  - 0 Specimen Collection (cultures, biopsies, blood, body fluids, etc.) []  - 0 Specimen(s) / Culture(s) sent or taken to Lab for analysis []  - 0 Patient Transfer (multiple staff / Civil Service fast streamer / Similar devices) []  - 0 Simple Staple / Suture removal (25 or less) []  - 0 Complex Staple / Suture removal (26 or more) []  - 0 Hypo / Hyperglycemic Management (close monitor of Blood Glucose) []  - 0 Ankle / Brachial Index (ABI) - do not check if billed separately X- 1 5 Vital Signs Has the patient been seen at the hospital within the last three years: Yes Total Score: 80 Level Of Care: New/Established - Level 3 Electronic Signature(s) Signed: 06/12/2022 4:26:45 PM By: [name] Entered By: [name] on 06/03/2022 10:36:06 [name] ([id number] Page 3 of 4 -------------------------------------------------------------------------------- Encounter Discharge Information Details Patient Name: Date of Service: [name] 06/03/2022 11:00 A M Medical Record  Number: [id number] Patient Account Number: [id number] Date of Birth/Sex: Treating RN: 03/20/1950 (72 y.o. [name], [name] Primary Care Timberlyn Pickford: [name], [name] Other Clinician: Referring Dyna Figuereo: Treating Davian Hanshaw/Extender: Genice Rouge., [name] in Treatment: 21 Encounter Discharge Information Items Discharge Condition: Stable Ambulatory Status: Ambulatory Discharge Destination: Home Transportation: Private Auto Accompanied By: self Schedule Follow-up Appointment: Yes Clinical Summary of Care: Patient Declined Electronic Signature(s) Signed: 06/12/2022 4:26:45 PM By: [name] Entered By: [name] on 06/03/2022 10:35:22 -------------------------------------------------------------------------------- Patient/Caregiver Education Details Patient Name: Date of Service: [name] 3/25/2024andnbsp11:00 [place] Record Number: [id number] Patient Account Number: [id number] Date of Birth/Gender: Treating RN: [birth year] (72 y.o. [name], [name] Primary Care Physician: [name], [name] Other Clinician: Referring Physician: Treating Physician/Extender: Genice Rouge., [name] in Treatment: 21 Education Assessment Education Provided To: Patient Education Topics Provided Wound/Skin Impairment: Methods: Explain/Verbal Responses: Reinforcements needed, State content correctly [employer]) Signed: 06/12/2022 4:26:45 PM By: [name] Entered By: [name] on 06/03/2022 10:35:12 -------------------------------------------------------------------------------- Wound Assessment Details Patient Name: Date of Service: Marissa Perking Cardenas. 06/03/2022 11:00 A M Medical Record Number: [id number] Patient Account Number: [id number] Date of Birth/Sex: Treating RN: 07/20/1950 (72 y.o. [name], [name] Primary Care Zanyiah Posten: [name], [name] Other Clinician: Referring Keltie Labell: Treating  Ilah Boule/Extender: Dalene Seltzer in Treatment: 21 Wound Status Wound Number: 1 Primary Etiology: Pressure Ulcer Wound Location: Sacrum Wound Status: Open Wounding Event: Pressure Injury Date Acquired: 10/09/2021 Weeks Of Treatment: 21 Clustered Wound: No [name], [name] ([id number]) [id number].pdf Page 4 of 4 Wound Measurements Length: (cm) 0.4 Width: (cm) 0.4 Depth: (cm) 1.2 Area: (cm) 0.126 Volume: (cm) 0.151 % Reduction in Area: 64.3% % Reduction in Volume: 38.9% Wound Description Classification: Category/Stage IV Exudate Amount: Medium Exudate Type: Serosanguineous Exudate Color: red, brown Periwound Skin Texture Texture  Color No Abnormalities Noted: No No Abnormalities Noted: No Moisture No Abnormalities Noted: No Electronic Signature(s) Signed: 06/12/2022 4:26:45 PM By: [name] Entered By: [name] on 06/03/2022 10:34:51 -------------------------------------------------------------------------------- Vitals Details Patient Name: Date of Service: Marissa Perking Cardenas. 06/03/2022 11:00 A M Medical Record Number: [id number] Patient Account Number: [id number] Date of Birth/Sex: Treating RN: [birth year] (72 y.o. [name], [name] Primary Care Miller [place]: [name], [name] Other Clinician: Referring Umair Rosiles: Treating Aahan Marques/Extender: Genice Rouge., [name] in Treatment: 21 Vital Signs Time Taken: 10:30 Reference Range: 80 - 120 mg / dl Height (in): 64 Weight (lbs): 97 Body Mass Index (BMI): 16.6 Electronic Signature(s) Signed: 06/12/2022 4:26:45 PM By: [name] Entered By: [name] on 06/03/2022 10:34:42

## 2022-06-12 NOTE — Progress Notes (Signed)
[name], [name] ([id number]) [id number] Page 1 of 5 Visit Report for 06/05/2022 Arrival Information Details Patient Name: Date of Service: [name] 06/05/2022 1:30 PM Medical Record Number: [id number] Patient Account Number: [id number] Date of Birth/Sex: Treating RN: 1950-09-26 (71 y.o. [name], [name] Primary Care Icker Swigert: [name], [name] Other Clinician: Referring Ervan Heber: Treating Lowana Hable/Extender: [name] in Treatment: 88 Visit Information History Since Last Visit Added or deleted any medications: No Patient Arrived: Ambulatory Any new allergies or adverse reactions: No Arrival Time: 13:26 Had a fall or experienced change in No Accompanied By: self activities of daily living that may affect Transfer Assistance: None risk of falls: Patient Identification Verified: Yes Signs or symptoms of abuse/neglect since last visito No Secondary Verification Process Completed: Yes Hospitalized since last visit: No Patient Requires Transmission-Based Precautions: No Implantable device outside of the clinic excluding No Patient Has Alerts: No cellular tissue based products placed in the center since last visit: Has Dressing in Place as Prescribed: Yes Pain Present Now: No Electronic Signature(s) Signed: 06/12/2022 4:26:45 PM By: [name] Entered By: [name] on 06/05/2022 13:26:54 -------------------------------------------------------------------------------- Encounter Discharge Information Details Patient Name: Date of Service: [name], Marissa Easton. 06/05/2022 1:30 PM Medical Record Number: [id number] Patient Account Number: [id number] Date of Birth/Sex: Treating RN: 1950-11-18 (72 y.o. [name], [name] Primary Care Aubryanna Nesheim: [name], [name] Other Clinician: Referring Michaella Imai: Treating Quantisha Marsicano/Extender: [name] in Treatment: 22 Encounter  Discharge Information Items Discharge Condition: Stable Ambulatory Status: Ambulatory Discharge Destination: Home Transportation: Private Auto Accompanied By: self Schedule Follow-up Appointment: Yes Clinical Summary of Care: Patient Declined Electronic Signature(s) Signed: 06/12/2022 4:26:45 PM By: [name] Entered By: [name] on 06/05/2022 14:39:20 -------------------------------------------------------------------------------- Lower Extremity Assessment Details Patient Name: Date of Service: Marissa [name]. 06/05/2022 1:30 PM Medical Record Number: [id number] Patient Account Number: [id number] Date of Birth/Sex: Treating RN: 08-Nov-1950 (72 y.o. [name], [name] Primary Care Kamee Bobst: [name], [name] Other Clinician: Referring Norita Meigs: Treating Amarii Bordas/Extender: [name] in Treatment: 22 Electronic Signature(s) Signed: 06/12/2022 4:26:45 PM By: [name] [name] ([id number]) [id number] Page 2 of 5 Entered By: [name] on 06/05/2022 13:27:08 -------------------------------------------------------------------------------- Multi-Disciplinary Care Plan Details Patient Name: Date of Service: [name] 06/05/2022 1:30 PM Medical Record Number: [id number] Patient Account Number: [id number] Date of Birth/Sex: Treating RN: 03/02/1951 (72 y.o. [name], [name] Primary Care Morgan Rennert: [name], [name] Other Clinician: Referring Emelin Dascenzo: Treating Magalie Almon/Extender: [name] in Treatment: 22 Active Inactive Pain, Acute or Chronic Nursing Diagnoses: Pain, acute or chronic: actual or potential Potential alteration in comfort, pain Goals: Patient will verbalize adequate pain control and receive pain control interventions during procedures as needed Date Initiated: 01/02/2022 Target Resolution Date: 06/08/2022 Goal Status:  Active Patient/caregiver will verbalize adequate pain control between visits Date Initiated: 01/02/2022 Target Resolution Date: 06/08/2022 Goal Status: Active Interventions: Encourage patient to take pain medications as prescribed Provide education on pain management Reposition patient for comfort Treatment Activities: Administer pain control measures as ordered : 01/02/2022 Notes: Pressure Nursing Diagnoses: Knowledge deficit related to management of pressures ulcers Goals: Patient/caregiver will verbalize risk factors for pressure ulcer development Date Initiated: 01/02/2022 Target Resolution Date: 06/08/2022 Goal Status: Active Interventions: Assess: immobility, friction, shearing, incontinence upon admission and as needed Assess offloading mechanisms upon admission and as needed Provide education on pressure ulcers Treatment Activities: T  ordered outside of clinic : 01/02/2022 est Notes: Electronic Signature(s) Signed: 06/12/2022 4:26:45 PM By: [name] Entered By: [name] on 06/05/2022 13:26:19 -------------------------------------------------------------------------------- Negative Pressure Wound Therapy Application (NPWT) Details Patient Name: Date of Service: [name] 06/05/2022 1:30 PM Medical Record Number: [id number] Patient Account Number: [id number] Date of Birth/Sex: Treating RN: 1950-09-23 (72 y.o. [name], [name] Primary Care Annleigh Knueppel: [name], Eloy End Clinician: Referring Tesslyn Baumert: Treating Lopaka Karge/Extender: Julio Alm Socorro, Melbeta Cardenas ([id number]) [phone].pdf Page 3 of 5 Weeks in Treatment: 22 NPWT Application Performed for: Wound #1 Sacrum Performed By: [name], RN Type: VAC System Coverage Size (sq cm): 0.16 Pressure Type: Constant Pressure Setting: 125 mmHG Drain Type: None Primary Contact: Other Other: gauze kit/black foam Quantity of  Sponges/Gauze Inserted: 1 gauze packing 1 black foam Sponge/Dressing Type: Foam- Black, Gauze Date Initiated: 06/05/2022 Response to Treatment: tolerates well Post Procedure Diagnosis Same as Pre-procedure Electronic Signature(s) Signed: 06/12/2022 4:26:45 PM By: [name] Entered By: [name] on 06/05/2022 14:38:50 -------------------------------------------------------------------------------- Negative Pressure Wound Therapy Maintenance (NPWT) Details Patient Name: Date of ServiceRoney Cardenas 06/05/2022 1:30 PM Medical Record Number: [id number] Patient Account Number: [id number] Date of Birth/Sex: Treating RN: [birth year] (72 y.o. [name], [name] Primary Care Ashey Tramontana: [name], [name] Other Clinician: Referring Cuauhtemoc Huegel: Treating Jaxxon Naeem/Extender: [name] in Treatment: 22 NPWT Maintenance Performed for: Wound #1 Sacrum Performed By: [name], RN Type: Other Coverage Size (sq cm): 0.16 Pressure Type: Constant Pressure Setting: 125 mmHG Drain Type: None Primary Contact: Non-Adherent Sponge/Dressing Type: Foam- Black Date Initiated: 05/01/2022 Date Suspended: 05/15/2022 Dressing Removed: Yes Quantity of Sponges/Gauze Removed: 1 black foam Canister Changed: No Canister Exudate Volume: 50 Dressing Reapplied: No Date Discontinued: 05/15/2022 Respones T Treatment: o tolerates wel Days On NPWT : 15 Post Procedure Diagnosis Same as Pre-procedure Electronic Signature(s) Signed: 06/12/2022 4:26:45 PM By: [name] Entered By: [name] on 06/05/2022 14:37:53 -------------------------------------------------------------------------------- Pain Assessment Details Patient Name: Date of Service: Marissa Perking Cardenas. 06/05/2022 1:30 PM Medical Record Number: [id number] Patient Account Number: [id number] Date of Birth/Sex: Treating RN: 1950/12/04 (72 y.o. [name], [name] Primary Care  Marqui Formby: [name], [name] Other Clinician: Referring Jakaya Jacobowitz: Treating Lucindy Borel/Extender: [name] in Treatment: [address] [name], [name] ([id number]) [id number] Page 4 of 5 Location of Pain Severity and Description of Pain Patient Has Paino No Site Locations Pain Management and Medication Current Pain Management: Electronic Signature(s) Signed: 06/12/2022 4:26:45 PM By: [name] Entered By: [name] on 06/05/2022 13:27:02 -------------------------------------------------------------------------------- Patient/Caregiver Education Details Patient Name: Date of Service: [name] 3/27/2024andnbsp1:30 PM Medical Record Number: [id number] Patient Account Number: [id number] Date of Birth/Gender: Treating RN: [birth year] (72 y.o. [name], [name] Primary Care Physician: [name], [name] Other Clinician: Referring Physician: Treating Physician/Extender: [name] in Treatment: 22 Education Assessment Education Provided To: Patient Education Topics Provided Wound/Skin Impairment: Methods: Explain/Verbal Responses: Reinforcements needed, State content correctly [employer]) Signed: 06/12/2022 4:26:45 PM By: [name] Entered By: [name] on 06/05/2022 13:26:30 -------------------------------------------------------------------------------- Wound Assessment Details Patient Name: Date of Service: Marissa [name]. 06/05/2022 1:30 PM Medical Record Number: [id number] Patient Account Number: [id number] Date of Birth/Sex: Treating RN: 11-12-1950 (72 y.o. F) Primary Care Jesselee Poth: [name], [name] Other Clinician: Referring Oveda Dadamo: Treating Shinika Estelle/Extender: [name] in Treatment: 22 Wound Status  Wound Number: 1 Primary Etiology: Pressure Ulcer Wound Location: Sacrum Wound  Status: Open [name], [name] ([id number]) [id number] Page 5 of 5 Wounding Event: Pressure Injury Comorbid History: Colitis Date Acquired: 10/09/2021 Weeks Of Treatment: 22 Clustered Wound: No Photos Wound Measurements Length: (cm) 0.4 Width: (cm) 0.4 Depth: (cm) 1 Area: (cm) 0.126 Volume: (cm) 0.126 % Reduction in Area: 64.3% % Reduction in Volume: 49% Wound Description Classification: Category/Stage IV Exudate Amount: Medium Exudate Type: Serosanguineous Exudate Color: red, brown Periwound Skin Texture Texture Color No Abnormalities Noted: No No Abnormalities Noted: No Moisture No Abnormalities Noted: No Electronic Signature(s) Signed: 06/06/2022 8:57:18 AM By: Sandre Kitty Entered By: Sandre Kitty on 06/05/2022 13:28:30 -------------------------------------------------------------------------------- Vitals Details Patient Name: Date of Service: Marissa Perking Cardenas. 06/05/2022 1:30 PM Medical Record Number: [id number] Patient Account Number: [id number] Date of Birth/Sex: Treating RN: Aug 01, 1950 (72 y.o. F) Primary Care Jenille Laszlo: [name], [name] Other Clinician: Referring Epimenio Schetter: Treating Skye Rodarte/Extender: [name] in Treatment: 22 Vital Signs Time Taken: 12:29 Temperature (F): 97.9 Height (in): 64 Pulse (bpm): 64 Weight (lbs): 97 Respiratory Rate (breaths/min): 18 Body Mass Index (BMI): 16.6 Blood Pressure (mmHg): 151/85 Reference Range: 80 - 120 mg / dl Electronic Signature(s) Signed: 06/06/2022 8:57:18 AM By: Sandre Kitty Entered By: Sandre Kitty on 06/05/2022 13:30:09

## 2022-06-12 NOTE — Progress Notes (Signed)
WELDA, SLIFER R ([id number]) [id number] Page 1 of 6 Visit Report for 06/12/2022 Arrival Information Details Patient Name: Date of Service: [name] 06/12/2022 10:15 A M Medical Record Number: [id number] Patient Account Number: [id number] Date of Birth/Sex: Treating RN: [birth year] (72 y.o. F) Primary Care [name]: Marissa Mac., [name] Other Clinician: Referring [name]: Treating [name]/Extender: [name] in Treatment: 23 Visit Information History Since Last Visit Added or deleted any medications: No Patient Arrived: Ambulatory Any new allergies or adverse reactions: No Arrival Time: 10:40 Had a fall or experienced change in No Accompanied By: self activities of daily living that may affect Transfer Assistance: None risk of falls: Patient Identification Verified: Yes Signs or symptoms of abuse/neglect since last visito No Secondary Verification Process Completed: Yes Hospitalized since last visit: No Patient Requires Transmission-Based Precautions: No Implantable device outside of the clinic excluding No Patient Has Alerts: No cellular tissue based products placed in the center since last visit: Has Dressing in Place as Prescribed: No Pain Present Now: No Notes Pt states group home took her to another wound center 06/11/22, and they removed the wound vac Electronic Signature(s) Signed: 06/12/2022 4:50:23 PM By: Erenest Blank Entered By: Erenest Blank on 06/12/2022 10:41:13 -------------------------------------------------------------------------------- Encounter Discharge Information Details Patient Name: Date of Service: Marissa Perking R. 06/12/2022 10:15 A M Medical Record Number: [id number] Patient Account Number: [id number] Date of Birth/Sex: Treating RN: [birth year]Carden[birth year] (72 y.o. [name] Primary Care Josalynn Johndrow: Marissa Mac., [name] Other Clinician: Referring Thera Basden: Treating [name]/Extender: [name] in Treatment: 23 Encounter Discharge Information Items Discharge Condition: Stable Ambulatory Status: Ambulatory Discharge Destination: Home Transportation: Private Auto Accompanied By: self Schedule Follow-up Appointment: Yes Clinical Summary of Care: Patient Declined Electronic Signature(s) Signed: 06/12/2022 3:49:12 PM By: Sharyn Creamer RN, BSN Entered By: Sharyn Creamer on 06/12/2022 15:44:40 -------------------------------------------------------------------------------- Lower Extremity Assessment Details Patient Name: Date of Service: Marissa [name] 06/12/2022 10:15 A M Medical Record Number: [id number] Patient Account Number: [id number] Date of Birth/Sex: Treating RN: [name] (72 y.o. F) Primary Care Ritisha Deitrick: Marissa Mac., [name] Other Clinician: Referring Lasaundra Riche: Treating Kileigh Ortmann/Extender: [name] in Treatment: [address] ([id number]) [phone].pdf Page 2 of 6 Electronic Signature(s) Signed: 06/12/2022 4:50:23 PM By: Erenest Blank Entered By: Erenest Blank on 06/12/2022 10:42:53 -------------------------------------------------------------------------------- Multi-Disciplinary Care Plan Details Patient Name: Date of Service: Marissa [name] 06/12/2022 10:15 A M Medical Record Number: [id number] Patient Account Number: [id number] Date of Birth/Sex: Treating RN: [name] (72 y.o. [name] Primary Care Sharunda Salmon: Marissa Mac., [name] Other Clinician: Referring [name]: Treating [name]/Extender: [name] in Treatment: 23 Active Inactive Pain, Acute or Chronic Nursing Diagnoses: Pain, acute or chronic: actual or potential Potential alteration in comfort, pain Goals: Patient will verbalize adequate pain control and receive pain control interventions during procedures as needed Date Initiated: 01/02/2022 Target  Resolution Date: 06/08/2022 Goal Status: Active Patient/caregiver will verbalize adequate pain control between visits Date Initiated: 01/02/2022 Target Resolution Date: 06/08/2022 Goal Status: Active Interventions: Encourage patient to take pain medications as prescribed Provide education on pain management Reposition patient for comfort Treatment Activities: Administer pain control measures as ordered : 01/02/2022 Notes: Pressure Nursing Diagnoses: Knowledge deficit related to management of pressures ulcers Goals: Patient/caregiver will verbalize risk factors for pressure ulcer development Date Initiated: 01/02/2022 Target Resolution Date: 06/08/2022 Goal Status: Active Interventions: Assess: immobility, friction, shearing, incontinence upon admission and as  needed Assess offloading mechanisms upon admission and as needed Provide education on pressure ulcers Treatment Activities: T ordered outside of clinic : 01/02/2022 est Notes: Electronic Signature(s) Signed: 06/12/2022 3:49:12 PM By: Sharyn Creamer RN, BSN Entered By: Sharyn Creamer on 06/12/2022 10:51:13 -------------------------------------------------------------------------------- Negative Pressure Wound Therapy Maintenance (NPWT) Details Patient Name: Date of Service: [name] 06/12/2022 10:15 A M Medical Record Number: [id number] Patient Account Number: [id number] Date of Birth/Sex: Treating RN: [birth year] (72 y.o. [name], [place] R ([id number]) [phone].pdf Page 3 of 6 Primary Care [name]: Marissa Mac., [name] Other Clinician: Referring [name]: Treating [name]/Extender: [name] in Treatment: 23 NPWT Maintenance Performed for: Wound #1 Sacrum Performed By: Sharyn Creamer, RN Type: VAC System Coverage Size (sq cm): 0.06 Pressure Type: Constant Pressure Setting: 125 mmHG Drain Type: None Primary Contact: None Sponge/Dressing  Type: Foam- Black,Gauze Date Initiated: 06/05/2022 Dressing Removed: Yes Quantity of Sponges/Gauze Removed: wet to dry dressing removed Canister Changed: Yes Canister Exudate Volume: 0 Dressing Reapplied: Yes Quantity of Sponges/Gauze Inserted: 1 gauze, black foam to bridge Respones T Treatment: o tolerated well Days On NPWT : 8 Post Procedure Diagnosis Same as Pre-procedure Electronic Signature(s) Signed: 06/12/2022 3:49:12 PM By: Sharyn Creamer RN, BSN Entered By: Sharyn Creamer on 06/12/2022 11:17:52 -------------------------------------------------------------------------------- Pain Assessment Details Patient Name: Date of Service: Marissa Perking R. 06/12/2022 10:15 A M Medical Record Number: [id number] Patient Account Number: [id number] Date of Birth/Sex: Treating RN: [birth year]Cardenas (72 y.o. F) Primary Care Lillieann Pavlich: Marissa Mac., [name] Other Clinician: Referring Aowyn Rozeboom: Treating Christabel Camire/Extender: [name] in Treatment: 23 Active Problems Location of Pain Severity and Description of Pain Patient Has Paino No Site Locations Pain Management and Medication Current Pain Management: Electronic Signature(s) Signed: 06/12/2022 4:50:23 PM By: Erenest Blank Entered By: Erenest Blank on 06/12/2022 10:42:46 [name] ([id number]) [id number] Page 4 of 6 -------------------------------------------------------------------------------- Patient/Caregiver Education Details Patient Name: Date of Service: [name], Marissa NEY R. 4/3/2024andnbsp10:15 A M Medical Record Number: [id number] Patient Account Number: [id number] Date of Birth/Gender: Treating RN: Carden[birth year] (72 y.o. [name] Primary Care Physician: Marissa Mac., [name] Other Clinician: Referring Physician: Treating Physician/Extender: [name] in Treatment: 23 Education Assessment Education Provided To: Patient Education  Topics Provided Wound/Skin Impairment: Methods: Explain/Verbal Responses: State content correctly [employer]) Signed: 06/12/2022 3:49:12 PM By: Sharyn Creamer RN, BSN Entered By: Sharyn Creamer on 06/12/2022 10:51:29 -------------------------------------------------------------------------------- Wound Assessment Details Patient Name: Date of Service: Marissa Perking R. 06/12/2022 10:15 A M Medical Record Number: [id number] Patient Account Number: [id number] Date of Birth/Sex: Treating RN: [birth year]Cardenas (72 y.o. F) Primary Care Nelton Amsden: Marissa Mac., [name] Other Clinician: Referring Britny Riel: Treating Zennie Ayars/Extender: [name] in Treatment: 23 Wound Status Wound Number: 1 Primary Etiology: Pressure Ulcer Wound Location: Sacrum Wound Status: Open Wounding Event: Pressure Injury Comorbid History: Colitis Date Acquired: 10/09/2021 Weeks Of Treatment: 23 Clustered Wound: No Photos Wound Measurements Length: (cm) 0.3 Width: (cm) 0.2 Depth: (cm) 1 Area: (cm) 0.047 Volume: (cm) 0.047 % Reduction in Area: 86.7% % Reduction in Volume: 81% Undermining: Yes Starting Position (o'clock): 8 Ending Position (o'clock): 11 Maximum Distance: (cm) 1.5 Wound Description Classification: Category/Stage IV Exudate Amount: Medium Heidt, Gaetana R ([id number]) Exudate Type: Serosanguineous Exudate Color: red, brown Foul Odor After Cleansing: No Slough/Fibrino Yes [id number].pdf Page 5 of 6 Wound Bed Granulation Amount: Large (67-100%) Exposed Structure Granulation Quality: Red Fascia Exposed: No Necrotic Amount:  Small (1-33%) Fat Layer (Subcutaneous Tissue) Exposed: Yes Necrotic Quality: Adherent Slough Tendon Exposed: No Muscle Exposed: No Joint Exposed: No Bone Exposed: No Periwound Skin Texture Texture Color No Abnormalities Noted: No No Abnormalities Noted: No Moisture No Abnormalities Noted: No Treatment  Notes Wound #1 (Sacrum) Cleanser Wound Cleanser Discharge Instruction: Cleanse the wound with wound cleanser prior to applying a clean dressing using gauze sponges, not tissue or cotton balls. Peri-Wound Care Skin Prep Discharge Instruction: Use skin prep as directed Topical Gentamicin Discharge Instruction: As directed by physician Primary Dressing Secondary Dressing Secured With Compression Wrap Compression Stockings Add-Ons Electronic Signature(s) Signed: 06/12/2022 4:50:23 PM By: Erenest Blank Entered By: Erenest Blank on 06/12/2022 10:51:19 -------------------------------------------------------------------------------- [place] Details Patient Name: Date of Service: Marissa Perking R. 06/12/2022 10:15 A M Medical Record Number: [id number] Patient Account Number: [id number] Date of Birth/Sex: Treating RN: [birth year]Cardenas (72 y.o. F) Primary Care Bard Haupert: Marissa Mac., [name] Other Clinician: Referring Kriss Perleberg: Treating Declin Rajan/Extender: [name] in Treatment: 23 Vital Signs Time Taken: 10:41 Temperature (F): Marissa.3 Height (in): 64 Pulse (bpm): 73 Weight (lbs): 97 Respiratory Rate (breaths/min): 16 Body Mass Index (BMI): 16.6 Blood Pressure (mmHg): 110/69 Reference Range: 80 - 120 mg / dl Electronic Signature(s) Signed: 06/12/2022 4:50:23 PM By: Erenest Blank Entered By: Erenest Blank on 06/12/2022 10:42:40 [name] ([id number]) [id number] Page 6 of 6

## 2022-06-12 NOTE — Progress Notes (Signed)
ARYAHI, SNITKER R ([id number]) [id number] Page 1 of 1 Visit Report for 06/03/2022 SuperBill Details Patient Name: Date of Service: [name] 06/03/2022 Medical Record Number: [id number] Patient Account Number: [id number] Date of Birth/Sex: Treating RN: 07-13-1950 (72 y.o. [name], [name] Primary Care Provider: [name], [name] Other Clinician: Referring Provider: Treating Provider/Extender: Dalene Seltzer in Treatment: 21 Diagnosis Coding ICD-10 Codes Code Description [phone] Pressure ulcer of sacral region, stage 4 M62.81 Muscle weakness (generalized) E43 Unspecified severe protein-calorie malnutrition Facility Procedures CPT4 Code Description Modifier Quantity [id number] [phone] - WOUND CARE VISIT-LEV 3 EST PT 1 Electronic Signature(s) Signed: 06/03/2022 12:03:15 PM By: Kalman Shan DO Signed: 06/12/2022 4:26:45 PM By: [name] Entered By: [name] on 06/03/2022 10:36:16

## 2022-06-14 ENCOUNTER — Encounter (HOSPITAL_BASED_OUTPATIENT_CLINIC_OR_DEPARTMENT_OTHER): Payer: 59 | Admitting: General Surgery

## 2022-06-14 DIAGNOSIS — E43 Unspecified severe protein-calorie malnutrition: Secondary | ICD-10-CM | POA: Diagnosis not present

## 2022-06-14 DIAGNOSIS — Z681 Body mass index (BMI) 19 or less, adult: Secondary | ICD-10-CM | POA: Diagnosis not present

## 2022-06-14 DIAGNOSIS — M6281 Muscle weakness (generalized): Secondary | ICD-10-CM | POA: Diagnosis not present

## 2022-06-14 DIAGNOSIS — L89153 Pressure ulcer of sacral region, stage 3: Secondary | ICD-10-CM | POA: Diagnosis not present

## 2022-06-14 DIAGNOSIS — L89154 Pressure ulcer of sacral region, stage 4: Secondary | ICD-10-CM | POA: Diagnosis not present

## 2022-06-15 NOTE — Progress Notes (Signed)
[name], [name] ([id number]) [id number] Page 1 of 4 Visit Report for 06/14/2022 Arrival Information Details Patient Name: Date of Service: [name], Marissa NEY Cardenas. 06/14/2022 7:30 A M Medical Record Number: [id number] Patient Account Number: [id number] Date of Birth/Sex: Treating RN: 08-Aug-1950 (72 y.o. Katrinka BlazingF) Scotton, Joanne Primary Care Lennart Gladish: Jayme CloudFoster Jr., Molly Maduroobert Other Clinician: Referring Theodus Ran: Treating Lamae Fosco/Extender: Judithann Gravesannon, Jennifer Foster Jr., Wonda Ceriseobert Weeks in Treatment: 23 Visit Information History Since Last Visit Added or deleted any medications: No Patient Arrived: Ambulatory Any new allergies or adverse reactions: No Arrival Time: 07:40 Had a fall or experienced change in No Accompanied By: self activities of daily living that may affect Transfer Assistance: None risk of falls: Patient Requires Transmission-Based Precautions: No Signs or symptoms of abuse/neglect since last visito No Patient Has Alerts: No Hospitalized since last visit: No Implantable device outside of the clinic excluding No cellular tissue based products placed in the center since last visit: Has Dressing in Place as Prescribed: Yes Pain Present Now: No Electronic Signature(s) Signed: 06/14/2022 5:41:01 PM By: Karie SchwalbeScotton, Joanne RN Entered By: Karie SchwalbeScotton, Joanne on 06/14/2022 08:21:26 -------------------------------------------------------------------------------- Encounter Discharge Information Details Patient Name: Date of Service: [name], Marissa NEY Cardenas. 06/14/2022 7:30 A M Medical Record Number: [id number] Patient Account Number: [id number] Date of Birth/Sex: Treating RN: 08-Aug-1950 (72 y.o. Katrinka BlazingF) Scotton, Joanne Primary Care Eura Mccauslin: Jayme CloudFoster Jr., Molly Maduroobert Other Clinician: Referring Neilani Duffee: Treating Genavie Boettger/Extender: Judithann Gravesannon, Jennifer Foster Jr., Wonda Ceriseobert Weeks in Treatment: 23 Encounter Discharge Information Items Discharge Condition: Stable Ambulatory Status:  Ambulatory Discharge Destination: Home Transportation: Private Auto Accompanied By: self Schedule Follow-up Appointment: Yes Clinical Summary of Care: Patient Declined Electronic Signature(s) Signed: 06/14/2022 5:41:01 PM By: Karie SchwalbeScotton, Joanne RN Entered By: Karie SchwalbeScotton, Joanne on 06/14/2022 08:24:37 -------------------------------------------------------------------------------- Negative Pressure Wound Therapy Maintenance (NPWT) Details Patient Name: Date of Service: [name], Marissa NEY Cardenas. 06/14/2022 7:30 A M Medical Record Number: [id number] Patient Account Number: [id number] Date of Birth/Sex: Treating RN: 08-Aug-1950 (72 y.o. Katrinka BlazingF) Scotton, Joanne Primary Care Gurley Climer: Jayme CloudFoster Jr., Molly Maduroobert Other Clinician: Referring Ruthanna Macchia: Treating Donnavin Vandenbrink/Extender: Judithann Gravesannon, Jennifer Foster Jr., Wonda Ceriseobert Weeks in Treatment: 23 NPWT Maintenance Performed for: Wound #1 Sacrum Performed By: Karie SchwalbeScotton, Joanne, RN Coverage Size (sq cm): 0.06 Pressure Type: Constant [name], [name] ([id number]) [id number] Page 2 of 4 Pressure Setting: 125 mmHG Drain Type: None Primary Contact: None Sponge/Dressing Type: Foam- Black Date Initiated: 06/05/2022 Dressing Removed: Yes Quantity of Sponges/Gauze Removed: 1 Canister Exudate Volume: 5 Dressing Reapplied: Yes Quantity of Sponges/Gauze Inserted: 1 gauze inserted (1 black foam used to bridge) Respones T Treatment: o tolerated well Days On NPWT : 10 Electronic Signature(s) Signed: 06/14/2022 5:41:01 PM By: Karie SchwalbeScotton, Joanne RN Entered By: Karie SchwalbeScotton, Joanne on 06/14/2022 08:23:56 -------------------------------------------------------------------------------- Patient/Caregiver Education Details Patient Name: Date of Service: Marissa LouO Cardenas, Marissa NEY Cardenas. 4/5/2024andnbsp7:30 A M Medical Record Number: [id number] Patient Account Number: [id number] Date of Birth/Gender: Treating RN: 08-Aug-1950 (72 y.o. Katrinka BlazingF) Scotton, Joanne Primary Care Physician: Jayme CloudFoster Jr., Molly Maduroobert  Other Clinician: Referring Physician: Treating Physician/Extender: Judithann Gravesannon, Jennifer Foster Jr., Wonda Ceriseobert Weeks in Treatment: 23 Education Assessment Education Provided To: Patient Education Topics Provided Wound/Skin Impairment: Methods: Explain/Verbal Responses: Return demonstration correctly Electronic Signature(s) Signed: 06/14/2022 5:41:01 PM By: Karie SchwalbeScotton, Joanne RN Entered By: Karie SchwalbeScotton, Joanne on 06/14/2022 08:24:19 -------------------------------------------------------------------------------- Wound Assessment Details Patient Name: Date of Service: [name], Marissa NEY Cardenas. 06/14/2022 7:30 A M Medical Record Number: [id number] Patient Account Number: [id number] Date of Birth/Sex: Treating RN: 08-Aug-1950 (72 y.o. Katrinka BlazingF) Scotton, Joanne Primary Care Bethaney Oshana: Jayme CloudFoster Jr., Molly Maduroobert Other Clinician: Referring Lakiesha Ralphs: Treating Chaun Uemura/Extender:  Duanne Guess Jayme Cloud., [name] in Treatment: 23 Wound Status Wound Number: 1 Primary Etiology: Pressure Ulcer Wound Location: Sacrum Wound Status: Open Wounding Event: Pressure Injury Comorbid History: Colitis Date Acquired: 10/09/2021 Weeks Of Treatment: 23 Clustered Wound: No Wound Measurements Length: (cm) 0.3 Width: (cm) 0.2 Depth: (cm) 1 Area: (cm) 0.0 Volume: (cm) 0.0 [name], [name] ([id number]) % Reduction in Area: 86.7% % Reduction in Volume: 81% Tunneling: No 47 Undermining: Yes 47 Starting Position (o'clock): 8 Ending Position (o'clock): 11 Maximum Distance: (cm) 1.5 [id number] Page 3 of 4 Wound Description Classification: Category/Stage IV Exudate Amount: Medium Exudate Type: Serosanguineous Exudate Color: red, brown Foul Odor After Cleansing: No Slough/Fibrino Yes Wound Bed Granulation Amount: Large (67-100%) Exposed Structure Granulation Quality: Red Fascia Exposed: No Necrotic Amount: Small (1-33%) Fat Layer (Subcutaneous Tissue) Exposed: Yes Necrotic Quality: Adherent  Slough Tendon Exposed: No Muscle Exposed: No Joint Exposed: No Bone Exposed: No Periwound Skin Texture Texture Color No Abnormalities Noted: No No Abnormalities Noted: No Moisture No Abnormalities Noted: No Treatment Notes Wound #1 (Sacrum) Cleanser Wound Cleanser Discharge Instruction: Cleanse the wound with wound cleanser prior to applying a clean dressing using gauze sponges, not tissue or cotton balls. Peri-Wound Care Skin Prep Discharge Instruction: Use skin prep as directed Topical Gentamicin Discharge Instruction: As directed by physician Primary Dressing Secondary Dressing Secured With Compression Wrap Compression Stockings Add-Ons Electronic Signature(s) Signed: 06/14/2022 5:41:01 PM By: Karie Schwalbe RN Entered By: Karie Schwalbe on 06/14/2022 08:22:24 -------------------------------------------------------------------------------- Vitals Details Patient Name: Date of Service: Marissa Berthold Cardenas. 06/14/2022 7:30 A M Medical Record Number: [id number] Patient Account Number: [id number] Date of Birth/Sex: Treating RN: [birth year] (72 y.o. [name] Primary Care Caeleb Batalla: Jayme Cloud., [name] Other Clinician: Referring Markas Aldredge: Treating Shameria Trimarco/Extender: [name]., [name] in Treatment: 23 Vital Signs Time Taken: 08:10 Temperature (F): 98.7 Height (in): 64 Pulse (bpm): 76 Weight (lbs): 97 Respiratory Rate (breaths/min): 14 Body Mass Index (BMI): 16.6 Blood Pressure (mmHg): 99/68 Reference Range: 80 - 120 mg / dl [name], [name] ([id number]) [id number] Page 4 of 4 Electronic Signature(s) Signed: 06/14/2022 5:41:01 PM By: Karie Schwalbe RN Entered By: Karie Schwalbe on 06/14/2022 08:21:52

## 2022-06-15 NOTE — Progress Notes (Signed)
BRITTAIN, ANSTEY R ([id number]) [id number] Page 1 of 1 Visit Report for 06/14/2022 SuperBill Details Patient Name: Date of Service: [name] 06/14/2022 Medical Record Number: [id number] Patient Account Number: [id number] Date of Birth/Sex: Treating RN: Feb 26, 1951 (72 y.o. [name] Primary Care Provider: Jayme Cloud., [name] Other Clinician: Referring Provider: Treating Provider/Extender: [name]., [name] in Treatment: 23 Diagnosis Coding ICD-10 Codes Code Description [phone] Pressure ulcer of sacral region, stage 4 M62.81 Muscle weakness (generalized) E43 Unspecified severe protein-calorie malnutrition Facility Procedures CPT4 Code Description Modifier Quantity [id number] [phone] - WOUND VAC-50 SQ CM OR LESS 1 ICD-10 Diagnosis Description L89.154 Pressure ulcer of sacral region, stage 4 Electronic Signature(s) Signed: 06/14/2022 9:02:23 AM By: Duanne Guess MD FACS Signed: 06/14/2022 5:41:01 PM By: Karie Schwalbe RN Entered By: Karie Schwalbe on 06/14/2022 01:09:32

## 2022-06-17 ENCOUNTER — Encounter (HOSPITAL_BASED_OUTPATIENT_CLINIC_OR_DEPARTMENT_OTHER): Payer: 59 | Admitting: Internal Medicine

## 2022-06-17 DIAGNOSIS — Z681 Body mass index (BMI) 19 or less, adult: Secondary | ICD-10-CM | POA: Diagnosis not present

## 2022-06-17 DIAGNOSIS — L89154 Pressure ulcer of sacral region, stage 4: Secondary | ICD-10-CM | POA: Diagnosis not present

## 2022-06-17 DIAGNOSIS — L89153 Pressure ulcer of sacral region, stage 3: Secondary | ICD-10-CM | POA: Diagnosis not present

## 2022-06-17 DIAGNOSIS — E43 Unspecified severe protein-calorie malnutrition: Secondary | ICD-10-CM | POA: Diagnosis not present

## 2022-06-17 DIAGNOSIS — M6281 Muscle weakness (generalized): Secondary | ICD-10-CM | POA: Diagnosis not present

## 2022-06-18 NOTE — Progress Notes (Signed)
MASHEKA, VANDOORN ([id number]) [id number] Page 1 of 3 Visit Report for 06/17/2022 Arrival Information Details Patient Name: Date of Service: [name]. 06/17/2022 11:00 A M Medical Record Number: [id number] Patient Account Number: [id number] Date of Birth/Sex: Treating RN: 08-04-1950 (72 y.o. F) Primary Care Ariana Cavenaugh: Jayme Cloud., [name] Other Clinician: Referring Curran Lenderman: Treating Nikitas Davtyan/Extender: Crissie Figures in Treatment: 23 Visit Information History Since Last Visit Added or deleted any medications: No Patient Arrived: Ambulatory Any new allergies or adverse reactions: No Arrival Time: 11:03 Had a fall or experienced change in No Accompanied By: self activities of daily living that may affect Transfer Assistance: None risk of falls: Patient Identification Verified: Yes Signs or symptoms of abuse/neglect since last visito No Secondary Verification Process Completed: Yes Hospitalized since last visit: No Patient Requires Transmission-Based Precautions: No Implantable device outside of the clinic excluding No Patient Has Alerts: No cellular tissue based products placed in the center since last visit: Has Dressing in Place as Prescribed: Yes Pain Present Now: No Electronic Signature(s) Signed: 06/18/2022 2:53:03 PM By: Thayer Dallas Entered By: Thayer Dallas on 06/17/2022 11:04:00 -------------------------------------------------------------------------------- Encounter Discharge Information Details Patient Name: Date of Service: Marissa Berthold Cardenas. 06/17/2022 11:00 A M Medical Record Number: [id number] Patient Account Number: [id number] Date of Birth/Sex: Treating RN: [birth year] (72 y.o. F) Primary Care Jedadiah Abdallah: Jayme Cloud., Mort Sawyers Clinician: Thayer Dallas Referring Annalia Metzger: Treating Celvin Taney/Extender: Allena Katz., [name] in Treatment: 23 Encounter Discharge Information  Items Discharge Condition: Stable Ambulatory Status: Ambulatory Discharge Destination: Home Transportation: Private Auto Accompanied By: caregiver Schedule Follow-up Appointment: Yes Clinical Summary of Care: Electronic Signature(s) Signed: 06/18/2022 2:53:03 PM By: Thayer Dallas Entered By: Thayer Dallas on 06/17/2022 12:01:40 -------------------------------------------------------------------------------- Negative Pressure Wound Therapy Maintenance (NPWT) Details Patient Name: Date of Service: [name] 06/17/2022 11:00 A M Medical Record Number: [id number] Patient Account Number: [id number] Date of Birth/Sex: Treating RN: 05-18-1950 (72 y.o. F) Primary Care Hue Frick: Jayme Cloud., [name] Other Clinician: Referring Rettie Laird: Treating Yazmyn Valbuena/Extender: Crissie Figures in Treatment: 23 NPWT Maintenance Performed for: Wound #1 Sacrum Performed By: Thayer Dallas, Coverage Size (sq cm): 0.06 Pressure Type: Constant [name], [name] ([id number]) [id number] Page 2 of 3 Pressure Setting: 125 mmHG Drain Type: None Primary Contact: None Sponge/Dressing Type: Foam- Blue Date Initiated: 06/05/2022 Dressing Removed: Yes Quantity of Sponges/Gauze Removed: 1 gauze, black foam Canister Changed: No Canister Exudate Volume: 0 Dressing Reapplied: Yes Quantity of Sponges/Gauze Inserted: 1 gauze, black foam Days On NPWT : 13 Electronic Signature(s) Signed: 06/18/2022 2:53:03 PM By: Thayer Dallas Entered By: Thayer Dallas on 06/17/2022 12:00:58 -------------------------------------------------------------------------------- Patient/Caregiver Education Details Patient Name: Date of Service: [name] 4/8/2024andnbsp11:00 A M Medical Record Number: [id number] Patient Account Number: [id number] Date of Birth/Gender: Treating RN: 09/19/1950 (72 y.o. F) Primary Care Physician: Jayme Cloud., [name] Other Clinician: Thayer Dallas Referring Physician: Treating Physician/Extender: Allena Katz., [name] in Treatment: 23 Education Assessment Education Provided To: Patient Education Topics Provided Electronic Signature(s) Signed: 06/18/2022 2:53:03 PM By: Thayer Dallas Entered By: Thayer Dallas on 06/17/2022 12:01:18 -------------------------------------------------------------------------------- Wound Assessment Details Patient Name: Date of Service: Marissa Deliah Goody Cardenas. 06/17/2022 11:00 A M Medical Record Number: [id number] Patient Account Number: [id number] Date of Birth/Sex: Treating RN: 04-Mar-1951 (72 y.o. F) Primary Care Mairead Schwarzkopf: Jayme Cloud., [name] Other Clinician: Referring Mahayla Haddaway: Treating Quinlee Sciarra/Extender: Crissie Figures in Treatment: 23 Wound Status Wound Number: 1 Primary Etiology: Pressure  Ulcer Wound Location: Sacrum Wound Status: Open Wounding Event: Pressure Injury Date Acquired: 10/09/2021 Weeks Of Treatment: 23 Clustered Wound: No Wound Measurements Length: (cm) 0.3 Width: (cm) 0.2 Depth: (cm) 1 Area: (cm) 0.047 Volume: (cm) 0.047 % Reduction in Area: 86.7% % Reduction in Volume: 81% Wound Description Classification: Category/Stage IV Exudate Amount: Medium Exudate Type: Serosanguineous [name], [name] ([id number]) Exudate Color: red, brown [id number] Page 3 of 3 Periwound Skin Texture Texture Color No Abnormalities Noted: No No Abnormalities Noted: No Moisture No Abnormalities Noted: No Treatment Notes Wound #1 (Sacrum) Cleanser Wound Cleanser Discharge Instruction: Cleanse the wound with wound cleanser prior to applying a clean dressing using gauze sponges, not tissue or cotton balls. Peri-Wound Care Skin Prep Discharge Instruction: Use skin prep as directed Topical Gentamicin Discharge Instruction: As directed by physician Primary Dressing Secondary Dressing Secured  With Compression Wrap Compression Stockings Add-Ons Electronic Signature(s) Signed: 06/18/2022 2:53:03 PM By: Thayer Dallas Entered By: Thayer Dallas on 06/17/2022 11:04:19 -------------------------------------------------------------------------------- Vitals Details Patient Name: Date of Service: Marissa Berthold Cardenas. 06/17/2022 11:00 A M Medical Record Number: [id number] Patient Account Number: [id number] Date of Birth/Sex: Treating RN: [birth year] (72 y.o. F) Primary Care Lyza Houseworth: Jayme Cloud., [name] Other Clinician: Referring Jaunita Mikels: Treating Sama Arauz/Extender: Crissie Figures in Treatment: 23 Vital Signs Time Taken: 11:04 Reference Range: 80 - 120 mg / dl Height (in): 64 Weight (lbs): 97 Body Mass Index (BMI): 16.6 Electronic Signature(s) Signed: 06/18/2022 2:53:03 PM By: Thayer Dallas Entered By: Thayer Dallas on 06/17/2022 11:04:08

## 2022-06-18 NOTE — Progress Notes (Signed)
JEKIA, AMER ([id number]) [id number] Page 1 of 1 Visit Report for 06/17/2022 SuperBill Details Patient Name: Date of Service: [name] 06/17/2022 Medical Record Number: [id number] Patient Account Number: [id number] Date of Birth/Sex: Treating RN: 23-Oct-1950 (72 y.o. F) Primary Care Provider: Jayme Cloud., [name] Other Clinician: Referring Provider: Treating Provider/Extender: Crissie Figures in Treatment: 23 Diagnosis Coding ICD-10 Codes Code Description [phone] Pressure ulcer of sacral region, stage 4 M62.81 Muscle weakness (generalized) E43 Unspecified severe protein-calorie malnutrition Facility Procedures CPT4 Code Description Modifier Quantity [id number] [phone] - WOUND VAC-50 SQ CM OR LESS 1 Electronic Signature(s) Signed: 06/17/2022 1:25:17 PM By: Geralyn Corwin DO Signed: 06/18/2022 2:53:03 PM By: Thayer Dallas Entered By: Thayer Dallas on 06/17/2022 12:01:46

## 2022-06-19 ENCOUNTER — Encounter (HOSPITAL_BASED_OUTPATIENT_CLINIC_OR_DEPARTMENT_OTHER): Payer: 59 | Admitting: Physician Assistant

## 2022-06-19 DIAGNOSIS — L89154 Pressure ulcer of sacral region, stage 4: Secondary | ICD-10-CM | POA: Diagnosis not present

## 2022-06-19 DIAGNOSIS — E43 Unspecified severe protein-calorie malnutrition: Secondary | ICD-10-CM | POA: Diagnosis not present

## 2022-06-19 DIAGNOSIS — Z681 Body mass index (BMI) 19 or less, adult: Secondary | ICD-10-CM | POA: Diagnosis not present

## 2022-06-19 DIAGNOSIS — M6281 Muscle weakness (generalized): Secondary | ICD-10-CM | POA: Diagnosis not present

## 2022-06-19 DIAGNOSIS — L89153 Pressure ulcer of sacral region, stage 3: Secondary | ICD-10-CM | POA: Diagnosis not present

## 2022-06-19 NOTE — Progress Notes (Signed)
[name] Cardenas ([id number]) [id number] Page 1 of 7 Visit Report for 06/19/2022 Chief Complaint Document Details Patient Name: Date of Service: [name] 06/19/2022 1:15 PM Medical Record Number: [id number] Patient Account Number: [id number] Date of Birth/Sex: Treating RN: 1950-05-06 (72 y.o. F) Primary Care Provider: Jayme Cardenas., [name] Other Clinician: Referring Provider: Treating Provider/Extender: Crista Curb in Treatment: 24 Information Obtained from: Patient Chief Complaint Pressure ulcer stage 3 Sacrum Electronic Signature(s) Signed: 06/19/2022 1:09:08 PM By: Allen Derry PA-C Entered By: Allen Derry on 06/19/2022 13:09:08 -------------------------------------------------------------------------------- HPI Details Patient Name: Date of Service: [name]. 06/19/2022 1:15 PM Medical Record Number: [id number] Patient Account Number: [id number] Date of Birth/Sex: Treating RN: 1950/12/26 (72 y.o. F) Primary Care Provider: Jayme Cardenas., [name] Other Clinician: Referring Provider: Treating Provider/Extender: Crista Curb in Treatment: 24 History of Present Illness HPI Description: 01-02-22 upon evaluation today patient presents for initial inspection here in our clinic concerning a wound that she has over the sacral region. This is stated to be present since around the beginning of August 2023. She currently resides in a assisted nursing facility. She does seem to be able to answer questions fully today. Subsequently I do note that she is a little on the thin side but again other than the protein calorie malnutrition she is minimally weak but still does get up and move around some but she also tells me that she "sits a lot". She has not had any x-rays of the sacral region at this point. 01-09-2022 upon evaluation today patient's wound in the sacral area actually appears to be doing  decently well. Fortunately I do not see any signs of infection which is great news and overall I am extremely pleased with where things stand today. 11/8; this is a patient who lives in some form of small assisted living in [place]. She has a deep wound over the lower part of her coccyx. An x-ray that we ordered from last time did not show any osseous abnormalities. We are supposed to be using Hydrofera Blue in the wound but I am really not sure what they are using to dress this and who is doing it. Talking to our staff they have apparently discussed this with the staff in the facility. She does not have home health. 01-23-2022 upon evaluation today patient appears to be doing decently well in regard to her wound. She has been tolerating the dressing changes without complication although I am not certain the dressing changes have been done appropriately over the past couple of weeks. We have been trying to get her to come in here we also try to get her into a wound care center in [place] which would be closer for the assisted living facility. Unfortunately neither 1 of those were undertaken up to this point. The patient did end up having some training of the staff from an RN that they brought him to have the staff perform the dressing changes but that being said it does not sound like this has been done correctly. Nonetheless I do believe that based on what we see it would benefit the patient to actually have her come here 3 times a week also think a wound VAC could potentially be a possibility for her which would help to get things moving in a much better direction as far as healing is concerned. We need to get this to fill-in though it looks clean and does not  look infected I do think that we need to really get this to fill-in and there is quite a bit of undermining that is to be considered here. 01-30-2022 upon evaluation today patient appears to be doing well currently in regard to her wound which  is looking pretty decent but still has quite a bit of space underneath as far as undermining is concerned. I think she might possibly be better with a wound VAC. With that being said if when I do this we probably only be able to do it 2 times a week at most. I discussed that with the patient today she is okay with that we just need to see if we can get the insurance approval and then of course the scheduling underway. 02-13-2022 upon evaluation today patient appears to be doing well currently in regard to her wound. She actually tells me that it is feeling a lot better which is good news. Fortunately I do not see any signs of active infection at this time. No fevers, chills, nausea, vomiting, or diarrhea. 02-27-2022 upon evaluation today patient appears to be doing well currently in regard to her wound. She is actually showing some signs of improvement we are still obtaining the approval for the snap VAC which I think could be beneficial in the meantime we been using the Hydrofera Blue rope. 03-27-2022 upon evaluation today patient appears to be doing okay in regard to her wound but there was no dressing in place upon arrival today. Fortunately I see again no evidence of infection. No fevers, chills, nausea, vomiting, or diarrhea. 04-03-2022 upon evaluation today patient did not have a dressing on when she came into the office. With that being said she actually tells me that it was on until yesterday she took a shower and it came loose and then today came off completely. With that being said I really do not think she needs to be taking shower every single day or having to see her here in the clinic 3 times per week on Monday, Wednesday, and Friday and during those times obviously in between she needs to probably avoid showering. For that reason what I advised her to do would be to shower in the mornings on the days that she comes to see [place] that way if the dressing does come off or start to come off we  will be changing it anyway. She voiced understanding and tells me she can do that. Unfortunately the facility has nobody to help her with changing the dressings and we are not currently having to do that here in the clinic again. This means [name], [name] ([id number]) [id number] Page 2 of 7 that during the time that she was not coming in from December 20 through when I saw her last week on the 17th there was a period of 2 weeks where the wound really was not changed at all as the RN that Baird Lyons tell me they had at the facility did not end up working out. This obviously is not good and is not what we expect to see as far as patient care is concerned which is why we are now seeing her 3 times a week here in the clinic. 04-10-2022 upon evaluation today patient appears to be doing well currently in regard to her wound which is actually showing signs of good granulation epithelization at this point. However she does have quite a bit of drainage and we are little concerned about the possibility of infection. For that reason  I think she could benefit from a PCR culture followed by [facility] topical antibiotics. I am also thinking of going ahead and doing gentamicin today as well. 2/7; small wound on the lower sacrum however with considerable degree of undermining from roughly 7-4. PCR culture that was done last week showed "no organisms". We have been using gentamicin and iodoform packing.She lives in some form of group home in [place] I believe. Uncertain about the adequacy of offloading this area 04-24-2022 upon evaluation patient's wound actually is showing signs of doing decently well I do believe she would benefit from a snap VAC and we discussed that again today. Fortunately I do not see any signs of active infection locally nor systemically which is great news. No fevers, chills, nausea, vomiting, or diarrhea. 05-01-2022 upon evaluation today patient's wound is really  doing about the same. Fortunately I do not see any signs of active infection locally nor systemically at this time which is great news with that being said we have gotten approval for the snap VAC and we will going to go ahead and place that today. 05-08-2022 upon evaluation today patient appears to be doing well currently in regard to her wound all things considered but were having a difficult time with a snap VAC suctioning appropriately. With that being said we will get a go ahead and likely avoid the snap VAC at this point at least until we can get the actual brace dressing as bridging ourselves does not seem to be working nearly as well. 3/6; I note the difficulty with a snap VAC which is disappointing but apparently another type of VAC is being considered. Also a referral to plastic surgery at [facility]. The patient has a very small but deep and at the base circumferential undermining. She is coming in here now 3 times a week for [facility] placement 3/13; patient presents for follow-up. She has been using Hydrofera Blue 3 times a week. She comes into our clinic for nurse visits to have this done. We are still awaiting a wound VAC. She has no issues or complaints today. 05-29-2022 upon evaluation today patient appears to be doing about the same in regard to her wound. She still has significant undermining although the opening is a huge the undermining is significant. I do believe that a gauze VAC would be beneficial here. The patient does not eat normally and in fact tells me that she eats very well. She also is doing supplements with Juven at this point. She also has a good ability to get up and move around she is completely ambulatory and does not just lay with pressure on this area. She also is able to roll and reposition in bed without complication. For that reason she really has no need for a group 2 mattress at this point whatsoever this would be a complete waste of resources as far as  insurance and medical equipment is concerned. As far as her urinary incontinence she has no issues with incontinence at this point she is able to carry herself to the bathroom when needed she does wear a depends just in case but again that is not a routinely utilized item. 06-05-2022 upon evaluation today patient appears to be doing decently well in regard to her wound I do not see any signs of infection overall she seems to be doing quite well. I do think that she is making good progress in general here. We do have the wound VAC however and I think this is  going to potentially help to get this. Hand has been the biggest issue we have had is getting this to actually feeling more effectively. 06-12-2022 upon evaluation today patient appears to be doing okay in regard to her wound. We are still seeing some signs of improvement. Fortunately there does not appear to be any evidence of active infection at this time which is good news she did see the doctors over at [facility] yesterday and they are ordering an MRI in preparation for considering possibility of a surgical closure. The patient voiced understanding she had forgotten we have made this referral. T be o honest I was back in February kind of forgot about the referral to in the interim trying to proceed with her wound care. Nonetheless I appreciate their be evaluation of this and again we will see what the MRI shows. 06-19-2022 upon evaluation today patient's wound is actually showing signs of good improvement and very pleased with where we stand today. Fortunately I do not see any evidence of infection locally nor systemically which is great news I think the wound VAC is doing a good job of undermining seems less than last week. Electronic Signature(s) Signed: 06/19/2022 2:34:42 PM By: Allen Derry PA-C Entered By: Allen Derry on 06/19/2022 14:34:42 -------------------------------------------------------------------------------- Physical Exam  Details Patient Name: Date of Service: [name] 06/19/2022 1:15 PM Medical Record Number: [id number] Patient Account Number: [id number] Date of Birth/Sex: Treating RN: Mar 11, 1951 (72 y.o. F) Primary Care Provider: Jayme Cardenas., [name] Other Clinician: Referring Provider: Treating Provider/Extender: Crista Curb in Treatment: 24 Constitutional Well-nourished and well-hydrated in no acute distress. Respiratory normal breathing without difficulty. Psychiatric this patient is able to make decisions and demonstrates good insight into disease process. Alert and Oriented x 3. pleasant and cooperative. Notes Upon inspection patient's wound bed actually showed signs of excellent improvement which is great news and overall I do believe that he is making some really good progress here. I do not think there is any signs of active infection which is great news as well and again she is tolerating the wound VAC quite well. The undermining is significantly improved. Electronic Signature(s) [name], [name] ([id number]) [id number] Page 3 of 7 Signed: 06/19/2022 2:35:07 PM By: Allen Derry PA-C Entered By: Allen Derry on 06/19/2022 14:35:06 -------------------------------------------------------------------------------- Physician Orders Details Patient Name: Date of Service: [name]. 06/19/2022 1:15 PM Medical Record Number: [id number] Patient Account Number: [id number] Date of Birth/Sex: Treating RN: 09-23-1950 (72 y.o. [name], [name] Primary Care Provider: Jayme Cardenas., [name] Other Clinician: Referring Provider: Treating Provider/Extender: Crista Curb in Treatment: 24 Verbal / Phone Orders: No Diagnosis Coding ICD-10 Coding Code Description L89.154 Pressure ulcer of sacral region, stage 4 M62.81 Muscle weakness (generalized) E43 Unspecified severe protein-calorie malnutrition Follow-up  Appointments ppointment in 1 week. Junious Silk ST and Lauran Rm # 9 WEdnesday 06/26/22 @ 1:15pm one Return A Nurse Visit: - Friday 06/21/22 @ 9:45 am Monday 06/24/22 @ 7:45 FRiday 06/28/22 @ 1115 Other: - Administrator Jonette Eva) : [phone] PCR culture negative-no Jodie Echevaria ordered. Regular culture negative as of 05/15/22 WE are ordering an x-ray for pt. We are sending the prescription for it with pt. It can be done at any [facility] facility. NO appointment necessary. Just take prescription in and hand it to front desk. Anesthetic (In clinic) Topical Lidocaine 5% applied to wound bed Negative Presssure Wound Therapy Wound Vac to wound continuously at 167mm/hg pressure -  Apply gauze wound vac kit to wound. Change 3 x a week. Apply gentamicin first to wound bed. Black and White Foam combination - gauze kit medela wound vac- order through wound Q Off-Loading Turn and reposition every 2 hours Wound Treatment Wound #1 - Sacrum Cleanser: Wound Cleanser (Home Health) 3 x Per Week/30 Days Discharge Instructions: Cleanse the wound with wound cleanser prior to applying a clean dressing using gauze sponges, not tissue or cotton balls. Peri-Wound Care: Skin Prep (Home Health) 3 x Per Week/30 Days Discharge Instructions: Use skin prep as directed Topical: Gentamicin 3 x Per Week/30 Days Discharge Instructions: As directed by physician Electronic Signature(s) Unsigned Entered By: Fonnie Mu on 06/19/2022 13:53:41 -------------------------------------------------------------------------------- Problem List Details Patient Name: Date of Service: [name] 06/19/2022 1:15 PM Medical Record Number: [id number] Patient Account Number: [id number] Date of Birth/Sex: Treating RN: 06/22/1950 (72 y.o. F) Primary Care Provider: Jayme Cardenas., Mort Sawyers Clinician: MEELA, [name] ([id number]) [id number] Page 4 of 7 Referring Provider: Treating  Provider/Extender: Crista Curb in Treatment: 24 Active Problems ICD-10 Encounter Code Description Active Date MDM Diagnosis L89.154 Pressure ulcer of sacral region, stage 4 01/02/2022 No Yes M62.81 Muscle weakness (generalized) 01/02/2022 No Yes E43 Unspecified severe protein-calorie malnutrition 01/02/2022 No Yes Inactive Problems Resolved Problems Electronic Signature(s) Signed: 06/19/2022 1:08:56 PM By: Allen Derry PA-C Entered By: Allen Derry on 06/19/2022 13:08:56 -------------------------------------------------------------------------------- Progress Note Details Patient Name: Date of Service: [name]. 06/19/2022 1:15 PM Medical Record Number: [id number] Patient Account Number: [id number] Date of Birth/Sex: Treating RN: 14-Oct-1950 (72 y.o. F) Primary Care Provider: Jayme Cardenas., [name] Other Clinician: Referring Provider: Treating Provider/Extender: Crista Curb in Treatment: 24 Subjective Chief Complaint Information obtained from Patient Pressure ulcer stage 3 Sacrum History of Present Illness (HPI) 01-02-22 upon evaluation today patient presents for initial inspection here in our clinic concerning a wound that she has over the sacral region. This is stated to be present since around the beginning of August 2023. She currently resides in a assisted nursing facility. She does seem to be able to answer questions fully today. Subsequently I do note that she is a little on the thin side but again other than the protein calorie malnutrition she is minimally weak but still does get up and move around some but she also tells me that she "sits a lot". She has not had any x-rays of the sacral region at this point. 01-09-2022 upon evaluation today patient's wound in the sacral area actually appears to be doing decently well. Fortunately I do not see any signs of infection which is great news and overall I am extremely  pleased with where things stand today. 11/8; this is a patient who lives in some form of small assisted living in [place]. She has a deep wound over the lower part of her coccyx. An x-ray that we ordered from last time did not show any osseous abnormalities. We are supposed to be using Hydrofera Blue in the wound but I am really not sure what they are using to dress this and who is doing it. Talking to our staff they have apparently discussed this with the staff in the facility. She does not have home health. 01-23-2022 upon evaluation today patient appears to be doing decently well in regard to her wound. She has been tolerating the dressing changes without complication although I am not certain the dressing changes have been done appropriately over the  past couple of weeks. We have been trying to get her to come in here we also try to get her into a wound care center in [place] which would be closer for the assisted living facility. Unfortunately neither 1 of those were undertaken up to this point. The patient did end up having some training of the staff from an RN that they brought him to have the staff perform the dressing changes but that being said it does not sound like this has been done correctly. Nonetheless I do believe that based on what we see it would benefit the patient to actually have her come here 3 times a week also think a wound VAC could potentially be a possibility for her which would help to get things moving in a much better direction as far as healing is concerned. We need to get this to fill-in though it looks clean and does not look infected I do think that we need to really get this to fill-in and there is quite a bit of undermining that is to be considered here. 01-30-2022 upon evaluation today patient appears to be doing well currently in regard to her wound which is looking pretty decent but still has quite a bit of space underneath as far as undermining is concerned. I  think she might possibly be better with a wound VAC. With that being said if when I do this we probably only be able to do it 2 times a week at most. I discussed that with the patient today she is okay with that we just need to see if we can get the insurance approval and then of course the scheduling underway. 02-13-2022 upon evaluation today patient appears to be doing well currently in regard to her wound. She actually tells me that it is feeling a lot better which is good news. Fortunately I do not see any signs of active infection at this time. No fevers, chills, nausea, vomiting, or diarrhea. [name], [name] ([id number]) [id number] Page 5 of 7 02-27-2022 upon evaluation today patient appears to be doing well currently in regard to her wound. She is actually showing some signs of improvement we are still obtaining the approval for the snap VAC which I think could be beneficial in the meantime we been using the Hydrofera Blue rope. 03-27-2022 upon evaluation today patient appears to be doing okay in regard to her wound but there was no dressing in place upon arrival today. Fortunately I see again no evidence of infection. No fevers, chills, nausea, vomiting, or diarrhea. 04-03-2022 upon evaluation today patient did not have a dressing on when she came into the office. With that being said she actually tells me that it was on until yesterday she took a shower and it came loose and then today came off completely. With that being said I really do not think she needs to be taking shower every single day or having to see her here in the clinic 3 times per week on Monday, Wednesday, and Friday and during those times obviously in between she needs to probably avoid showering. For that reason what I advised her to do would be to shower in the mornings on the days that she comes to see [place] that way if the dressing does come off or start to come off we will be changing it anyway.  She voiced understanding and tells me she can do that. Unfortunately the facility has nobody to help her with changing the dressings and we  are not currently having to do that here in the clinic again. This means that during the time that she was not coming in from December 20 through when I saw her last week on the 17th there was a period of 2 weeks where the wound really was not changed at all as the RN that Baird Lyons tell me they had at the facility did not end up working out. This obviously is not good and is not what we expect to see as far as patient care is concerned which is why we are now seeing her 3 times a week here in the clinic. 04-10-2022 upon evaluation today patient appears to be doing well currently in regard to her wound which is actually showing signs of good granulation epithelization at this point. However she does have quite a bit of drainage and we are little concerned about the possibility of infection. For that reason I think she could benefit from a PCR culture followed by [facility] topical antibiotics. I am also thinking of going ahead and doing gentamicin today as well. 2/7; small wound on the lower sacrum however with considerable degree of undermining from roughly 7-4. PCR culture that was done last week showed "no organisms". We have been using gentamicin and iodoform packing.She lives in some form of group home in [place] I believe. Uncertain about the adequacy of offloading this area 04-24-2022 upon evaluation patient's wound actually is showing signs of doing decently well I do believe she would benefit from a snap VAC and we discussed that again today. Fortunately I do not see any signs of active infection locally nor systemically which is great news. No fevers, chills, nausea, vomiting, or diarrhea. 05-01-2022 upon evaluation today patient's wound is really doing about the same. Fortunately I do not see any signs of active infection locally nor systemically at this time  which is great news with that being said we have gotten approval for the snap VAC and we will going to go ahead and place that today. 05-08-2022 upon evaluation today patient appears to be doing well currently in regard to her wound all things considered but were having a difficult time with a snap VAC suctioning appropriately. With that being said we will get a go ahead and likely avoid the snap VAC at this point at least until we can get the actual brace dressing as bridging ourselves does not seem to be working nearly as well. 3/6; I note the difficulty with a snap VAC which is disappointing but apparently another type of VAC is being considered. Also a referral to plastic surgery at [facility]. The patient has a very small but deep and at the base circumferential undermining. She is coming in here now 3 times a week for [facility] placement 3/13; patient presents for follow-up. She has been using Hydrofera Blue 3 times a week. She comes into our clinic for nurse visits to have this done. We are still awaiting a wound VAC. She has no issues or complaints today. 05-29-2022 upon evaluation today patient appears to be doing about the same in regard to her wound. She still has significant undermining although the opening is a huge the undermining is significant. I do believe that a gauze VAC would be beneficial here. The patient does not eat normally and in fact tells me that she eats very well. She also is doing supplements with Juven at this point. She also has a good ability to get up and move around she is completely  ambulatory and does not just lay with pressure on this area. She also is able to roll and reposition in bed without complication. For that reason she really has no need for a group 2 mattress at this point whatsoever this would be a complete waste of resources as far as insurance and medical equipment is concerned. As far as her urinary incontinence she has no issues with incontinence at  this point she is able to carry herself to the bathroom when needed she does wear a depends just in case but again that is not a routinely utilized item. 06-05-2022 upon evaluation today patient appears to be doing decently well in regard to her wound I do not see any signs of infection overall she seems to be doing quite well. I do think that she is making good progress in general here. We do have the wound VAC however and I think this is going to potentially help to get this. Hand has been the biggest issue we have had is getting this to actually feeling more effectively. 06-12-2022 upon evaluation today patient appears to be doing okay in regard to her wound. We are still seeing some signs of improvement. Fortunately there does not appear to be any evidence of active infection at this time which is good news she did see the doctors [facility] at [facility] yesterday and they are ordering an MRI in preparation for considering possibility of a surgical closure. The patient voiced understanding she had forgotten we have made this referral. T be o honest I was back in February kind of forgot about the referral to in the interim trying to proceed with her wound care. Nonetheless I appreciate their be evaluation of this and again we will see what the MRI shows. 06-19-2022 upon evaluation today patient's wound is actually showing signs of good improvement and very pleased with where we stand today. Fortunately I do not see any evidence of infection locally nor systemically which is great news I think the wound VAC is doing a good job of undermining seems less than last week. Objective Constitutional Well-nourished and well-hydrated in no acute distress. Vitals Time Taken: 1:30 PM, Height: 64 in, Weight: 97 lbs, BMI: 16.6, Temperature: 98.3 F, Pulse: 69 bpm, Respiratory Rate: 18 breaths/min, Blood Pressure: 112/72 mmHg. Respiratory normal breathing without difficulty. Psychiatric this patient is able to make  decisions and demonstrates good insight into disease process. Alert and Oriented x 3. pleasant and cooperative. General Notes: Upon inspection patient's wound bed actually showed signs of excellent improvement which is great news and overall I do believe that he is making some really good progress here. I do not think there is any signs of active infection which is great news as well and again she is tolerating the wound VAC quite well. The undermining is significantly improved. [name], [name] ([id number]) [id number] Page 6 of 7 Integumentary (Hair, Skin) Wound #1 status is Open. Original cause of wound was Pressure Injury. The date acquired was: 10/09/2021. The wound has been in treatment 24 weeks. The wound is located on the Sacrum. The wound measures 0.3cm length x 0.2cm width x 1cm depth; 0.047cm^2 area and 0.047cm^3 volume. There is Fat Layer (Subcutaneous Tissue) exposed. There is no tunneling noted, however, there is undermining starting at 12:00 and ending at 6:00 with a maximum distance of 2.1cm. There is a medium amount of serosanguineous drainage noted. The wound margin is distinct with the outline attached to the wound base. There is large (67-100%) red,  pink granulation within the wound bed. There is no necrotic tissue within the wound bed. The periwound skin appearance did not exhibit: Callus, Crepitus, Excoriation, Induration, Rash, Scarring, Dry/Scaly, Maceration, Atrophie Blanche, Cyanosis, Ecchymosis, Hemosiderin Staining, Mottled, Pallor, Rubor, Erythema. Periwound temperature was noted as No Abnormality. The periwound has tenderness on palpation. Assessment Active Problems ICD-10 Pressure ulcer of sacral region, stage 4 Muscle weakness (generalized) Unspecified severe protein-calorie malnutrition Plan Follow-up Appointments: Return Appointment in 1 week. Junious Silk ST and Lauran Rm # 9 WEdnesday 06/26/22 @ 1:15pm one Nurse Visit: - Friday 06/21/22  @ 9:45 am Monday 06/24/22 @ 7:45 FRiday 06/28/22 @ 1115 Other: - Administrator Jonette Eva) : [phone] PCR culture negative-no Jodie Echevaria ordered. Regular culture negative as of 05/15/22 WE are ordering an x-ray for pt. We are sending the prescription for it with pt. It can be done at any [facility] facility. NO appointment necessary. Just take prescription in and hand it to front desk. Anesthetic: (In clinic) Topical Lidocaine 5% applied to wound bed Negative Presssure Wound Therapy: Wound Vac to wound continuously at 174mm/hg pressure - Apply gauze wound vac kit to wound. Change 3 x a week. Apply gentamicin first to wound bed. Black and White Foam combination - gauze kit medela wound vac- order through wound Q Off-Loading: Turn and reposition every 2 hours WOUND #1: - Sacrum Wound Laterality: Cleanser: Wound Cleanser (Home Health) 3 x Per Week/30 Days Discharge Instructions: Cleanse the wound with wound cleanser prior to applying a clean dressing using gauze sponges, not tissue or cotton balls. Peri-Wound Care: Skin Prep (Home Health) 3 x Per Week/30 Days Discharge Instructions: Use skin prep as directed Topical: Gentamicin 3 x Per Week/30 Days Discharge Instructions: As directed by physician 1. I would recommend that we have the patient continue to monitor for any signs of infection or worsening. Based on what I am seeing I do think that the patient is making good progress with regard to the overall healing. 2. I am good recommend as well we continue the wound VAC we are using the gentamicin along with this which seems to be doing quite well. We will see patient back for reevaluation in 1 week here in the clinic. If anything worsens or changes patient will contact our office for additional recommendations. Electronic Signature(s) Signed: 06/19/2022 2:35:27 PM By: Allen Derry PA-C Entered By: Allen Derry on 06/19/2022  14:35:27 -------------------------------------------------------------------------------- SuperBill Details Patient Name: Date of Service: [name]. 06/19/2022 Medical Record Number: [id number] Patient Account Number: [id number] Date of Birth/Sex: Treating RN: 1950/11/02 (72 y.o. [name], [name] Primary Care Provider: Jayme Cardenas., [name] Other Clinician: Referring Provider: Treating Provider/Extender: Crista Curb in Treatment: 24 Diagnosis Coding ICD-10 Codes Code Description [phone] Pressure ulcer of sacral region, stage 4 [name], [name] ([id number]) [phone].pdf Page 7 of 7 M62.81 Muscle weakness (generalized) E43 Unspecified severe protein-calorie malnutrition Facility Procedures : CPT4 Code: [id number] Description: 97605 - WOUND VAC-50 SQ CM OR LESS Modifier: Quantity: 1 Physician Procedures : CPT4 Code Description Modifier [id number] - WC PHYS LEVEL 4 - EST PT ICD-10 Diagnosis Description L89.154 Pressure ulcer of sacral region, stage 4 M62.81 Muscle weakness (generalized) E43 Unspecified severe protein-calorie malnutrition Quantity: 1 Electronic Signature(s) Signed: 06/19/2022 2:35:42 PM By: Allen Derry PA-C Entered By: Allen Derry on 06/19/2022 14:35:41

## 2022-06-19 NOTE — Progress Notes (Signed)
[name], [name] ([id number]) [id number] Page 1 of 6 Visit Report for 06/19/2022 Arrival Information Details Patient Name: Date of Service: [name] 06/19/2022 1:15 PM Medical Record Number: [id number] Patient Account Number: [id number] Date of Birth/Sex: Treating RN: [birth year] (72 y.o. F) Primary Care [name]: Marissa Cloud., [name] Other Clinician: Referring [name]: Treating [name]/Extender: [name] in Treatment: 24 Visit Information History Since Last Visit Added or deleted any medications: No Patient Arrived: Ambulatory Any new allergies or adverse reactions: No Arrival Time: 13:30 Had a fall or experienced change in No Accompanied By: self activities of daily living that may affect Transfer Assistance: None risk of falls: Patient Identification Verified: Yes Signs or symptoms of abuse/neglect since last visito No Secondary Verification Process Completed: Yes Hospitalized since last visit: No Patient Requires Transmission-Based Precautions: No Implantable device outside of the clinic excluding No Patient Has Alerts: No cellular tissue based products placed in the center since last visit: Has Dressing in Place as Prescribed: Yes Pain Present Now: Yes Electronic Signature(s) Signed: 06/19/2022 3:56:59 PM By: [name] Entered By: [name] on 06/19/2022 13:30:40 -------------------------------------------------------------------------------- Encounter Discharge Information Details Patient Name: Date of Service: Marissa [address] [name]. 06/19/2022 1:15 PM Medical Record Number: [id number] Patient Account Number: [id number] Date of Birth/Sex: Treating RN: [birth year] (72 y.o. [name], Marissa Primary Care [name]: Marissa Cloud., [name] Other Clinician: Referring Anely Spiewak: Treating Zian Mohamed/Extender: [name] in Treatment: 24 Encounter Discharge Information  Items Discharge Condition: Stable Ambulatory Status: Ambulatory Discharge Destination: Home Transportation: Private Auto Accompanied By: self Schedule Follow-up Appointment: Yes Clinical Summary of Care: Patient Declined Electronic Signature(s) Signed: 06/19/2022 3:30:40 PM By: Fonnie Mu RN Entered By: Fonnie Mu on 06/19/2022 13:55:14 -------------------------------------------------------------------------------- Lower Extremity Assessment Details Patient Name: Date of Service: [name] 06/19/2022 1:15 PM Medical Record Number: [id number] Patient Account Number: [id number] Date of Birth/Sex: Treating RN: [birth year] (72 y.o. [name], Marissa Primary Care Maleah Cardenas: Marissa Cloud., [name] Other Clinician: Referring Thersea Manfredonia: Treating [name]/Extender: [name] in Treatment: 24 Electronic Signature(s) Signed: 06/19/2022 3:30:40 PM By: Fonnie Mu RN [name] ([id number]) [id number] Page 2 of 6 Entered By: Fonnie Mu on 06/19/2022 13:46:34 -------------------------------------------------------------------------------- Multi-Disciplinary Care Plan Details Patient Name: Date of Service: [name] 06/19/2022 1:15 PM Medical Record Number: [id number] Patient Account Number: [id number] Date of Birth/Sex: Treating RN: 21-Jan-1951 (72 y.o. [name], Marissa Primary Care [name]: Marissa Cloud., [name] Other Clinician: Referring [name]: Treating [name]/Extender: [name] in Treatment: 24 Active Inactive Pain, Acute or Chronic Nursing Diagnoses: Pain, acute or chronic: actual or potential Potential alteration in comfort, pain Goals: Patient will verbalize adequate pain control and receive pain control interventions during procedures as needed Date Initiated: 01/02/2022 Target Resolution Date: 07/13/2022 Goal Status:  Active Patient/caregiver will verbalize adequate pain control between visits Date Initiated: 01/02/2022 Target Resolution Date: 07/13/2022 Goal Status: Active Interventions: Encourage patient to take pain medications as prescribed Provide education on pain management Reposition patient for comfort Treatment Activities: Administer pain control measures as ordered : 01/02/2022 Notes: Pressure Nursing Diagnoses: Knowledge deficit related to management of pressures ulcers Goals: Patient/caregiver will verbalize risk factors for pressure ulcer development Date Initiated: 01/02/2022 Target Resolution Date: 07/13/2022 Goal Status: Active Interventions: Assess: immobility, friction, shearing, incontinence upon admission and as needed Assess offloading mechanisms upon admission and as needed Provide education on pressure ulcers Treatment Activities: T ordered outside of  clinic : 01/02/2022 est Notes: Electronic Signature(s) Signed: 06/19/2022 3:30:40 PM By: Fonnie MuBreedlove, [name] RN Entered By: Fonnie MuBreedlove, Marissa on 06/19/2022 13:47:00 -------------------------------------------------------------------------------- Negative Pressure Wound Therapy Maintenance (NPWT) Details Patient Name: Date of Service: Marissa LouCO Cardenas, Marissa NEY Cardenas. 06/19/2022 1:15 PM Medical Record Number: [id number] Patient Account Number: [id number] Date of Birth/Sex: Treating RN: 1950-05-13 (72 y.o. Marissa [name]) Cardenas, Marissa Primary Care Damian Buckles: Marissa CloudFoster Jr., Cardenas Sawyersobert Other Clinician: Referring Aminata Buffalo: Treating Jeanny Rymer/Extender: [name], Marissa Foster Jr., [name], GonvickJANEY Cardenas ([id number]) [id number] Page 3 of 6 Weeks in Treatment: 24 NPWT Maintenance Performed for: Wound #1 Sacrum Performed By: Fonnie MuBreedlove, [name], RN Type: VAC System Coverage Size (sq cm): 0.06 Pressure Type: Constant Pressure Setting: 125 mmHG Drain Type: None Primary Contact: Non-Adherent Sponge/Dressing Type: Foam- Black Date  Initiated: 06/05/2022 Dressing Removed: Yes Quantity of Sponges/Gauze Removed: 1 black foam Canister Changed: No Canister Exudate Volume: 25 Dressing Reapplied: Yes Quantity of Sponges/Gauze Inserted: 1 black foam Respones T Treatment: o tolerates wel Days On NPWT : 15 Post Procedure Diagnosis Same as Pre-procedure Electronic Signature(s) Signed: 06/19/2022 3:30:40 PM By: Fonnie MuBreedlove, [name] RN Entered By: Fonnie MuBreedlove, Marissa on 06/19/2022 13:54:42 -------------------------------------------------------------------------------- Pain Assessment Details Patient Name: Date of Service: Marissa Marissa GoodyCKRELL, Marissa NEY Cardenas. 06/19/2022 1:15 PM Medical Record Number: [id number] Patient Account Number: [id number] Date of Birth/Sex: Treating RN: 1950-05-13 (72 y.o. F) Primary Care Bruchy Mikel: Marissa CloudFoster Jr., Marissa Maduroobert Other Clinician: Referring Trystin Terhune: Treating Rashonda Warrior/Extender: Marissa CurbStone Cardenas, Marissa Foster Jr., Marissa Weeks in Treatment: 24 Active Problems Location of Pain Severity and Description of Pain Patient Has Paino Yes Site Locations Rate the pain. Current Pain Level: 5 Pain Management and Medication Current Pain Management: Electronic Signature(s) Signed: 06/19/2022 3:56:59 PM By: Marissa Itoawkins, Destiny Entered By: Marissa Itoawkins, Destiny on 06/19/2022 13:31:02 Raoul PitchOCKRELL, Caridad Cardenas ([id number]) [id number] Page 4 of 6 -------------------------------------------------------------------------------- Patient/Caregiver Education Details Patient Name: Date of Service: Marissa Jarome LamasCKRELL, Marissa NEY Cardenas. 4/10/2024andnbsp1:15 PM Medical Record Number: [id number] Patient Account Number: [id number] Date of Birth/Gender: Treating RN: 1950-05-13 (72 y.o. Marissa [name]) Cardenas, Marissa Primary Care Physician: Marissa CloudFoster Jr., Marissa Maduroobert Other Clinician: Referring Physician: Treating Physician/Extender: Marissa CurbStone Cardenas, Marissa Foster Jr., Marissa Weeks in Treatment: 24 Education Assessment Education Provided To: Patient Education Topics  Provided Wound/Skin Impairment: Methods: Explain/Verbal Responses: Reinforcements needed, State content correctly [employer] CompanyElectronic Signature(s) Signed: 06/19/2022 3:30:40 PM By: Fonnie MuBreedlove, [name] RN Entered By: Fonnie MuBreedlove, Marissa on 06/19/2022 13:47:10 -------------------------------------------------------------------------------- Wound Assessment Details Patient Name: Date of Service: Marissa Jarome LamasCKRELL, Marissa NEY Cardenas. 06/19/2022 1:15 PM Medical Record Number: [id number] Patient Account Number: [id number] Date of Birth/Sex: Treating RN: 1950-05-13 (72 y.o. F) Primary Care Breeanna Galgano: Marissa CloudFoster Jr., Marissa Maduroobert Other Clinician: Referring Sinan Tuch: Treating Riyah Bardon/Extender: Marissa CurbStone Cardenas, Marissa Foster Jr., Marissa Weeks in Treatment: 24 Wound Status Wound Number: 1 Primary Etiology: Pressure Ulcer Wound Location: Sacrum Wound Status: Open Wounding Event: Pressure Injury Comorbid History: Colitis Date Acquired: 10/09/2021 Weeks Of Treatment: 24 Clustered Wound: No Photos Wound Measurements Length: (cm) 0.3 Width: (cm) 0.2 Depth: (cm) 1 Area: (cm) 0.047 Volume: (cm) 0.047 % Reduction in Area: 86.7% % Reduction in Volume: 81% Tunneling: No Undermining: Yes Starting Position (o'clock): 12 Ending Position (o'clock): 6 Maximum Distance: (cm) 2.1 Wound Description Classification: Category/Stage IV Wound Margin: Distinct, outline attached Cardenas, [name] ([id number]) Exudate Amount: Medium Exudate Type: Serosanguineous Exudate Color: red, brown Foul Odor After Cleansing: No Slough/Fibrino No [id number] Page 5 of 6 Wound Bed Granulation Amount: Large (67-100%) Exposed Structure Granulation Quality: Red, Pink Fat Layer (Subcutaneous Tissue) Exposed: Yes Necrotic Amount: None Present (0%) Periwound Skin Texture Texture Color No Abnormalities Noted: No  No Abnormalities Noted: No Callus: No Atrophie Blanche: No Crepitus: No Cyanosis: No Excoriation: No Ecchymosis:  No Induration: No Erythema: No Rash: No Hemosiderin Staining: No Scarring: No Mottled: No Pallor: No Moisture Rubor: No No Abnormalities Noted: No Dry / Scaly: No Temperature / Pain Maceration: No Temperature: No Abnormality Tenderness on Palpation: Yes Treatment Notes Wound #1 (Sacrum) Cleanser Wound Cleanser Discharge Instruction: Cleanse the wound with wound cleanser prior to applying a clean dressing using gauze sponges, not tissue or cotton balls. Peri-Wound Care Skin Prep Discharge Instruction: Use skin prep as directed Topical Gentamicin Discharge Instruction: As directed by physician Primary Dressing Secondary Dressing Secured With Compression Wrap Compression Stockings Add-Ons Electronic Signature(s) Signed: 06/19/2022 3:30:40 PM By: Fonnie Mu RN Entered By: Fonnie Mu on 06/19/2022 13:48:29 -------------------------------------------------------------------------------- Vitals Details Patient Name: Date of Service: Marissa [name]. 06/19/2022 1:15 PM Medical Record Number: [id number] Patient Account Number: [id number] Date of Birth/Sex: Treating RN: 09/12/1950 (72 y.o. F) Primary Care Bobbyjoe Pabst: Marissa Cloud., [name] Other Clinician: Referring Axie Hayne: Treating Cru Kritikos/Extender: [name] in Treatment: 24 Vital Signs Time Taken: 13:30 Temperature (F): 98.3 Height (in): 64 Pulse (bpm): 69 Weight (lbs): 97 Respiratory Rate (breaths/min): 18 Body Mass Index (BMI): 16.6 Blood Pressure (mmHg): 112/72 Reference Range: 80 - 120 mg / dl [name], [name] ([id number]) [id number] Page 6 of 6 Electronic Signature(s) Signed: 06/19/2022 3:56:59 PM By: [name] Entered By: [name] on 06/19/2022 13:30:55

## 2022-06-21 ENCOUNTER — Encounter (HOSPITAL_BASED_OUTPATIENT_CLINIC_OR_DEPARTMENT_OTHER): Payer: 59 | Admitting: Internal Medicine

## 2022-06-21 DIAGNOSIS — L89154 Pressure ulcer of sacral region, stage 4: Secondary | ICD-10-CM | POA: Diagnosis not present

## 2022-06-21 DIAGNOSIS — E43 Unspecified severe protein-calorie malnutrition: Secondary | ICD-10-CM | POA: Diagnosis not present

## 2022-06-21 DIAGNOSIS — Z681 Body mass index (BMI) 19 or less, adult: Secondary | ICD-10-CM | POA: Diagnosis not present

## 2022-06-21 DIAGNOSIS — L89153 Pressure ulcer of sacral region, stage 3: Secondary | ICD-10-CM | POA: Diagnosis not present

## 2022-06-21 DIAGNOSIS — M6281 Muscle weakness (generalized): Secondary | ICD-10-CM | POA: Diagnosis not present

## 2022-06-24 ENCOUNTER — Encounter (HOSPITAL_BASED_OUTPATIENT_CLINIC_OR_DEPARTMENT_OTHER): Payer: 59 | Admitting: Internal Medicine

## 2022-06-24 DIAGNOSIS — L89154 Pressure ulcer of sacral region, stage 4: Secondary | ICD-10-CM | POA: Diagnosis not present

## 2022-06-24 DIAGNOSIS — Z681 Body mass index (BMI) 19 or less, adult: Secondary | ICD-10-CM | POA: Diagnosis not present

## 2022-06-24 DIAGNOSIS — E43 Unspecified severe protein-calorie malnutrition: Secondary | ICD-10-CM | POA: Diagnosis not present

## 2022-06-24 DIAGNOSIS — M4628 Osteomyelitis of vertebra, sacral and sacrococcygeal region: Secondary | ICD-10-CM | POA: Diagnosis not present

## 2022-06-24 DIAGNOSIS — L89153 Pressure ulcer of sacral region, stage 3: Secondary | ICD-10-CM | POA: Diagnosis not present

## 2022-06-24 DIAGNOSIS — M6281 Muscle weakness (generalized): Secondary | ICD-10-CM | POA: Diagnosis not present

## 2022-06-26 ENCOUNTER — Encounter (HOSPITAL_BASED_OUTPATIENT_CLINIC_OR_DEPARTMENT_OTHER): Payer: 59 | Admitting: Physician Assistant

## 2022-06-26 DIAGNOSIS — M6281 Muscle weakness (generalized): Secondary | ICD-10-CM | POA: Diagnosis not present

## 2022-06-26 DIAGNOSIS — E43 Unspecified severe protein-calorie malnutrition: Secondary | ICD-10-CM | POA: Diagnosis not present

## 2022-06-26 DIAGNOSIS — Z681 Body mass index (BMI) 19 or less, adult: Secondary | ICD-10-CM | POA: Diagnosis not present

## 2022-06-26 DIAGNOSIS — L89154 Pressure ulcer of sacral region, stage 4: Secondary | ICD-10-CM | POA: Diagnosis not present

## 2022-06-26 DIAGNOSIS — L89153 Pressure ulcer of sacral region, stage 3: Secondary | ICD-10-CM | POA: Diagnosis not present

## 2022-06-26 NOTE — Progress Notes (Addendum)
HORTENSIA, DUFFIN ([id number]) [id number] Page 1 of 7 Visit Report for 06/26/2022 Chief Complaint Document Details Patient Name: Date of Service: [name] 06/26/2022 1:15 PM Medical Record Number: [id number] Patient Account Number: [id number] Date of Birth/Sex: Treating RN: [birth year] (72 y.o. F) Primary Care Provider: Jayme Cloud., [name] Other Clinician: Referring Provider: Treating Provider/Extender: Crista Curb in Treatment: 25 Information Obtained from: Patient Chief Complaint Pressure ulcer stage 3 Sacrum Electronic Signature(s) Signed: 06/26/2022 1:58:47 PM By: Allen Derry PA-C Entered By: Allen Derry on 06/26/2022 13:58:47 -------------------------------------------------------------------------------- HPI Details Patient Name: Date of Service: Marissa Marissa Goody Cardenas. 06/26/2022 1:15 PM Medical Record Number: [id number] Patient Account Number: [id number] Date of Birth/Sex: Treating RN: 02/11/1951 (72 y.o. F) Primary Care Provider: Jayme Cloud., [name] Other Clinician: Referring Provider: Treating Provider/Extender: Crista Curb in Treatment: 25 History of Present Illness HPI Description: 01-02-22 upon evaluation today patient presents for initial inspection here in our clinic concerning a wound that she has over the sacral region. This is stated to be present since around the beginning of August 2023. She currently resides in a assisted nursing facility. She does seem to be able to answer questions fully today. Subsequently I do note that she is a little on the thin side but again other than the protein calorie malnutrition she is minimally weak but still does get up and move around some but she also tells me that she "sits a lot". She has not had any x-rays of the sacral region at this point. 01-09-2022 upon evaluation today patient's wound in the sacral area actually appears to be doing  decently well. Fortunately I do not see any signs of infection which is great news and overall I am extremely pleased with where things stand today. 11/8; this is a patient who lives in some form of small assisted living in [place]. She has a deep wound over the lower part of her coccyx. An x-ray that we ordered from last time did not show any osseous abnormalities. We are supposed to be using Hydrofera Blue in the wound but I am really not sure what they are using to dress this and who is doing it. Talking to our staff they have apparently discussed this with the staff in the facility. She does not have home health. 01-23-2022 upon evaluation today patient appears to be doing decently well in regard to her wound. She has been tolerating the dressing changes without complication although I am not certain the dressing changes have been done appropriately over the past couple of weeks. We have been trying to get her to come in here we also try to get her into a wound care center in [place] which would be closer for the assisted living facility. Unfortunately neither 1 of those were undertaken up to this point. The patient did end up having some training of the staff from an RN that they brought him to have the staff perform the dressing changes but that being said it does not sound like this has been done correctly. Nonetheless I do believe that based on what we see it would benefit the patient to actually have her come here 3 times a week also think a wound VAC could potentially be a possibility for her which would help to get things moving in a much better direction as far as healing is concerned. We need to get this to fill-in though it looks clean and does not  look infected I do think that we need to really get this to fill-in and there is quite a bit of undermining that is to be considered here. 01-30-2022 upon evaluation today patient appears to be doing well currently in regard to her wound which  is looking pretty decent but still has quite a bit of space underneath as far as undermining is concerned. I think she might possibly be better with a wound VAC. With that being said if when I do this we probably only be able to do it 2 times a week at most. I discussed that with the patient today she is okay with that we just need to see if we can get the insurance approval and then of course the scheduling underway. 02-13-2022 upon evaluation today patient appears to be doing well currently in regard to her wound. She actually tells me that it is feeling a lot better which is good news. Fortunately I do not see any signs of active infection at this time. No fevers, chills, nausea, vomiting, or diarrhea. 02-27-2022 upon evaluation today patient appears to be doing well currently in regard to her wound. She is actually showing some signs of improvement we are [name], [name] ([id number]) [id number] Page 2 of 7 still obtaining the approval for the snap VAC which I think could be beneficial in the meantime we been using the Hydrofera Blue rope. 03-27-2022 upon evaluation today patient appears to be doing okay in regard to her wound but there was no dressing in place upon arrival today. Fortunately I see again no evidence of infection. No fevers, chills, nausea, vomiting, or diarrhea. 04-03-2022 upon evaluation today patient did not have a dressing on when she came into the office. With that being said she actually tells me that it was on until yesterday she took a shower and it came loose and then today came off completely. With that being said I really do not think she needs to be taking shower every single day or having to see her here in the clinic 3 times per week on Monday, Wednesday, and Friday and during those times obviously in between she needs to probably avoid showering. For that reason what I advised her to do would be to shower in the mornings on the days that she  comes to see [place] that way if the dressing does come off or start to come off we will be changing it anyway. She voiced understanding and tells me she can do that. Unfortunately the facility has nobody to help her with changing the dressings and we are not currently having to do that here in the clinic again. This means that during the time that she was not coming in from December 20 through when I saw her last week on the 17th there was a period of 2 weeks where the wound really was not changed at all as the RN that Baird Lyons tell me they had at the facility did not end up working out. This obviously is not good and is not what we expect to see as far as patient care is concerned which is why we are now seeing her 3 times a week here in the clinic. 04-10-2022 upon evaluation today patient appears to be doing well currently in regard to her wound which is actually showing signs of good granulation epithelization at this point. However she does have quite a bit of drainage and we are little concerned about the possibility of infection. For that reason  I think she could benefit from a PCR culture followed by [facility] topical antibiotics. I am also thinking of going ahead and doing gentamicin today as well. 2/7; small wound on the lower sacrum however with considerable degree of undermining from roughly 7-4. PCR culture that was done last week showed "no organisms". We have been using gentamicin and iodoform packing.She lives in some form of group home in [place] I believe. Uncertain about the adequacy of offloading this area 04-24-2022 upon evaluation patient's wound actually is showing signs of doing decently well I do believe she would benefit from a snap VAC and we discussed that again today. Fortunately I do not see any signs of active infection locally nor systemically which is great news. No fevers, chills, nausea, vomiting, or diarrhea. 05-01-2022 upon evaluation today patient's wound is really doing  about the same. Fortunately I do not see any signs of active infection locally nor systemically at this time which is great news with that being said we have gotten approval for the snap VAC and we will going to go ahead and place that today. 05-08-2022 upon evaluation today patient appears to be doing well currently in regard to her wound all things considered but were having a difficult time with a snap VAC suctioning appropriately. With that being said we will get a go ahead and likely avoid the snap VAC at this point at least until we can get the actual brace dressing as bridging ourselves does not seem to be working nearly as well. 3/6; I note the difficulty with a snap VAC which is disappointing but apparently another type of VAC is being considered. Also a referral to plastic surgery at [facility]. The patient has a very small but deep and at the base circumferential undermining. She is coming in here now 3 times a week for [facility] placement 3/13; patient presents for follow-up. She has been using Hydrofera Blue 3 times a week. She comes into our clinic for nurse visits to have this done. We are still awaiting a wound VAC. She has no issues or complaints today. 05-29-2022 upon evaluation today patient appears to be doing about the same in regard to her wound. She still has significant undermining although the opening is a huge the undermining is significant. I do believe that a gauze VAC would be beneficial here. The patient does not eat normally and in fact tells me that she eats very well. She also is doing supplements with Juven at this point. She also has a good ability to get up and move around she is completely ambulatory and does not just lay with pressure on this area. She also is able to roll and reposition in bed without complication. For that reason she really has no need for a group 2 mattress at this point whatsoever this would be a complete waste of resources as far as insurance  and medical equipment is concerned. As far as her urinary incontinence she has no issues with incontinence at this point she is able to carry herself to the bathroom when needed she does wear a depends just in case but again that is not a routinely utilized item. 06-05-2022 upon evaluation today patient appears to be doing decently well in regard to her wound I do not see any signs of infection overall she seems to be doing quite well. I do think that she is making good progress in general here. We do have the wound VAC however and I think this is  going to potentially help to get this. Hand has been the biggest issue we have had is getting this to actually feeling more effectively. 06-12-2022 upon evaluation today patient appears to be doing okay in regard to her wound. We are still seeing some signs of improvement. Fortunately there does not appear to be any evidence of active infection at this time which is good news she did see the doctors over at [facility] yesterday and they are ordering an MRI in preparation for considering possibility of a surgical closure. The patient voiced understanding she had forgotten we have made this referral. T be o honest I was back in February kind of forgot about the referral to in the interim trying to proceed with her wound care. Nonetheless I appreciate their be evaluation of this and again we will see what the MRI shows. 06-19-2022 upon evaluation today patient's wound is actually showing signs of good improvement and very pleased with where we stand today. Fortunately I do not see any evidence of infection locally nor systemically which is great news I think the wound VAC is doing a good job of undermining seems less than last week. 06-26-2022 upon evaluation today patient appears to be doing well with regard to her wound although her wound VAC it actually died she had forgotten to charge it overnight. With that being said she does seem to be doing decently well at  this point based on what I am seeing I think that the undermining is much less than what it was previous. Fortunately I do not see any signs of active infection locally nor systemically at this time. Electronic Signature(s) Signed: 06/26/2022 2:53:47 PM By: Allen Derry PA-C Entered By: Allen Derry on 06/26/2022 14:53:47 -------------------------------------------------------------------------------- Physical Exam Details Patient Name: Date of Service: Marissa Marissa Goody Cardenas. 06/26/2022 1:15 PM Medical Record Number: [id number] Patient Account Number: [id number] Date of Birth/Sex: Treating RN: [birth year] (72 y.o. F) Primary Care Provider: Jayme Cloud., [name] Other Clinician: Referring Provider: Treating Provider/Extender: Crista Curb in Treatment: [address] [place] Cardenas ([id number]) [id number] Page 3 of 7 Constitutional Well-nourished and well-hydrated in no acute distress. Respiratory normal breathing without difficulty. Psychiatric this patient is able to make decisions and demonstrates good insight into disease process. Alert and Oriented x 3. pleasant and cooperative. Notes Upon inspection patient's wound bed actually showed signs of good granulation epithelization at this point. Fortunately I do not see any signs of worsening in general and I think again that the patient's undermining is actually improved which is great news I am hopeful this will continue to show signs of improvement ongoing. Electronic Signature(s) Signed: 06/26/2022 2:54:10 PM By: Allen Derry PA-C Entered By: Allen Derry on 06/26/2022 14:54:10 -------------------------------------------------------------------------------- Physician Orders Details Patient Name: Date of Service: Marissa Marissa Goody Cardenas. 06/26/2022 1:15 PM Medical Record Number: [id number] Patient Account Number: [id number] Date of Birth/Sex: Treating RN: 1950/07/13 (72 y.o. [name],  [name] Primary Care Provider: Jayme Cloud., [name] Other Clinician: Referring Provider: Treating Provider/Extender: Crista Curb in Treatment: 25 Verbal / Phone Orders: No Diagnosis Coding ICD-10 Coding Code Description L89.154 Pressure ulcer of sacral region, stage 4 M62.81 Muscle weakness (generalized) E43 Unspecified severe protein-calorie malnutrition Follow-up Appointments ppointment in 1 week. [name] Monday 07/01/22 @ 7:45 FRiday 07/05/22 @ 11:30 Other: - Administrator Jonette Eva) : [phone] PCR culture  negative-no [place] ordered. Regular culture negative as of 05/15/22 WE are ordering an x-ray for pt. We are sending the prescription for it with pt. It can be done at any [facility] facility. NO appointment necessary. Just take prescription in and hand it to front desk. Please Make sure wound Vac is always charged**** Anesthetic (In clinic) Topical Lidocaine 5% applied to wound bed Negative Presssure Wound Therapy Wound Vac to wound continuously at 160mm/hg pressure - Apply gauze wound vac kit to wound. Change 3 x a week. Apply gentamicin first to wound bed. Black and White Foam combination - gauze kit medela wound vac- order through wound Q Off-Loading Turn and reposition every 2 hours Wound Treatment Wound #1 - Sacrum Cleanser: Wound Cleanser (Home Health) 3 x Per Week/30 Days Discharge Instructions: Cleanse the wound with wound cleanser prior to applying a clean dressing using gauze sponges, not tissue or cotton balls. Peri-Wound Care: Skin Prep [facility]) 3 x Per Week/30 Days [name], [name] ([id number]) [id number] Page 4 of 7 Discharge Instructions: Use skin prep as directed Topical: Gentamicin 3 x Per Week/30 Days Discharge Instructions: As directed by physician Prim Dressing: Hydrofera Blue Classic  Foam Rope Dressing, 9x6 (mm/in) 3 x Per Week/30 Days ary Discharge Instructions: Moisten with vashe and pack into wound. Reapply wound vac Friday. Electronic Signature(s) Signed: 06/26/2022 6:42:55 PM By: Allen Derry PA-C Signed: 06/28/2022 11:19:08 AM By: Fonnie Mu RN Entered By: Fonnie Mu on 06/26/2022 14:14:10 -------------------------------------------------------------------------------- Problem List Details Patient Name: Date of Service: Marissa Marissa Goody Cardenas. 06/26/2022 1:15 PM Medical Record Number: [id number] Patient Account Number: [id number] Date of Birth/Sex: Treating RN: [birth year] (72 y.o. F) Primary Care Provider: Jayme Cloud., [name] Other Clinician: Referring Provider: Treating Provider/Extender: Crista Curb in Treatment: 25 Active Problems ICD-10 Encounter Code Description Active Date MDM Diagnosis L89.154 Pressure ulcer of sacral region, stage 4 01/02/2022 No Yes M62.81 Muscle weakness (generalized) 01/02/2022 No Yes E43 Unspecified severe protein-calorie malnutrition 01/02/2022 No Yes Inactive Problems Resolved Problems Electronic Signature(s) Signed: 06/26/2022 1:58:35 PM By: Allen Derry PA-C Entered By: Allen Derry on 06/26/2022 13:58:34 -------------------------------------------------------------------------------- Progress Note Details Patient Name: Date of Service: Marissa Marissa Goody Cardenas. 06/26/2022 1:15 PM Medical Record Number: [id number] Patient Account Number: [id number] Date of Birth/Sex: Treating RN: Feb 09, 1951 (72 y.o. F) Primary Care Provider: Jayme Cloud., [name] Other Clinician: Referring Provider: Treating Provider/Extender: Crista Curb in Treatment: [address], [place] Cardenas ([id number]) [id number] Page 5 of 7 Subjective Chief Complaint Information obtained from Patient Pressure ulcer stage 3 Sacrum History of Present Illness (HPI) 01-02-22 upon  evaluation today patient presents for initial inspection here in our clinic concerning a wound that she has over the sacral region. This is stated to be present since around the beginning of August 2023. She currently resides in a assisted nursing facility. She does seem to be able to answer questions fully today. Subsequently I do note that she is a little on the thin side but again other than the protein calorie malnutrition she is minimally weak but still does get up and move around some but she also tells me that she "sits a lot". She has not had any x-rays of the sacral region at this point. 01-09-2022 upon evaluation today patient's wound in the sacral area actually appears to be doing decently well. Fortunately I do not see any signs of infection which is great news and overall I am extremely pleased with where things stand today.  11/8; this is a patient who lives in some form of small assisted living in [place]. She has a deep wound over the lower part of her coccyx. An x-ray that we ordered from last time did not show any osseous abnormalities. We are supposed to be using Hydrofera Blue in the wound but I am really not sure what they are using to dress this and who is doing it. Talking to our staff they have apparently discussed this with the staff in the facility. She does not have home health. 01-23-2022 upon evaluation today patient appears to be doing decently well in regard to her wound. She has been tolerating the dressing changes without complication although I am not certain the dressing changes have been done appropriately over the past couple of weeks. We have been trying to get her to come in here we also try to get her into a wound care center in [place] which would be closer for the assisted living facility. Unfortunately neither 1 of those were undertaken up to this point. The patient did end up having some training of the staff from an RN that they brought him to have the staff  perform the dressing changes but that being said it does not sound like this has been done correctly. Nonetheless I do believe that based on what we see it would benefit the patient to actually have her come here 3 times a week also think a wound VAC could potentially be a possibility for her which would help to get things moving in a much better direction as far as healing is concerned. We need to get this to fill-in though it looks clean and does not look infected I do think that we need to really get this to fill-in and there is quite a bit of undermining that is to be considered here. 01-30-2022 upon evaluation today patient appears to be doing well currently in regard to her wound which is looking pretty decent but still has quite a bit of space underneath as far as undermining is concerned. I think she might possibly be better with a wound VAC. With that being said if when I do this we probably only be able to do it 2 times a week at most. I discussed that with the patient today she is okay with that we just need to see if we can get the insurance approval and then of course the scheduling underway. 02-13-2022 upon evaluation today patient appears to be doing well currently in regard to her wound. She actually tells me that it is feeling a lot better which is good news. Fortunately I do not see any signs of active infection at this time. No fevers, chills, nausea, vomiting, or diarrhea. 02-27-2022 upon evaluation today patient appears to be doing well currently in regard to her wound. She is actually showing some signs of improvement we are still obtaining the approval for the snap VAC which I think could be beneficial in the meantime we been using the Hydrofera Blue rope. 03-27-2022 upon evaluation today patient appears to be doing okay in regard to her wound but there was no dressing in place upon arrival today. Fortunately I see again no evidence of infection. No fevers, chills, nausea,  vomiting, or diarrhea. 04-03-2022 upon evaluation today patient did not have a dressing on when she came into the office. With that being said she actually tells me that it was on until yesterday she took a shower and it came loose and  then today came off completely. With that being said I really do not think she needs to be taking shower every single day or having to see her here in the clinic 3 times per week on Monday, Wednesday, and Friday and during those times obviously in between she needs to probably avoid showering. For that reason what I advised her to do would be to shower in the mornings on the days that she comes to see [place] that way if the dressing does come off or start to come off we will be changing it anyway. She voiced understanding and tells me she can do that. Unfortunately the facility has nobody to help her with changing the dressings and we are not currently having to do that here in the clinic again. This means that during the time that she was not coming in from December 20 through when I saw her last week on the 17th there was a period of 2 weeks where the wound really was not changed at all as the RN that Baird Lyons tell me they had at the facility did not end up working out. This obviously is not good and is not what we expect to see as far as patient care is concerned which is why we are now seeing her 3 times a week here in the clinic. 04-10-2022 upon evaluation today patient appears to be doing well currently in regard to her wound which is actually showing signs of good granulation epithelization at this point. However she does have quite a bit of drainage and we are little concerned about the possibility of infection. For that reason I think she could benefit from a PCR culture followed by [facility] topical antibiotics. I am also thinking of going ahead and doing gentamicin today as well. 2/7; small wound on the lower sacrum however with considerable degree of undermining from  roughly 7-4. PCR culture that was done last week showed "no organisms". We have been using gentamicin and iodoform packing.She lives in some form of group home in [place] I believe. Uncertain about the adequacy of offloading this area 04-24-2022 upon evaluation patient's wound actually is showing signs of doing decently well I do believe she would benefit from a snap VAC and we discussed that again today. Fortunately I do not see any signs of active infection locally nor systemically which is great news. No fevers, chills, nausea, vomiting, or diarrhea. 05-01-2022 upon evaluation today patient's wound is really doing about the same. Fortunately I do not see any signs of active infection locally nor systemically at this time which is great news with that being said we have gotten approval for the snap VAC and we will going to go ahead and place that today. 05-08-2022 upon evaluation today patient appears to be doing well currently in regard to her wound all things considered but were having a difficult time with a snap VAC suctioning appropriately. With that being said we will get a go ahead and likely avoid the snap VAC at this point at least until we can get the actual brace dressing as bridging ourselves does not seem to be working nearly as well. 3/6; I note the difficulty with a snap VAC which is disappointing but apparently another type of VAC is being considered. Also a referral to plastic surgery at [facility]. The patient has a very small but deep and at the base circumferential undermining. She is coming in here now 3 times a week for [facility] placement 3/13; patient presents  for follow-up. She has been using Hydrofera Blue 3 times a week. She comes into our clinic for nurse visits to have this done. We are still awaiting a wound VAC. She has no issues or complaints today. 05-29-2022 upon evaluation today patient appears to be doing about the same in regard to her wound. She still has  significant undermining although the opening is a huge the undermining is significant. I do believe that a gauze VAC would be beneficial here. The patient does not eat normally and in fact tells me that she eats very well. She also is doing supplements with Juven at this point. She also has a good ability to get up and move around she is completely ambulatory and does not just lay with pressure on this area. She also is able to roll and reposition in bed without complication. For that reason she really has no need for a group 2 mattress at this point whatsoever this would be a complete waste of resources as far as insurance and medical equipment is concerned. As far as her urinary incontinence she has no issues with incontinence at this point she is able to carry herself to the bathroom when needed she does wear a depends just in case but again that is not a routinely utilized item. 06-05-2022 upon evaluation today patient appears to be doing decently well in regard to her wound I do not see any signs of infection overall she seems to be doing quite well. I do think that she is making good progress in general here. We do have the wound VAC however and I think this is going to potentially help to [name], [name] ([id number]) [id number] Page 6 of 7 get this. Hand has been the biggest issue we have had is getting this to actually feeling more effectively. 06-12-2022 upon evaluation today patient appears to be doing okay in regard to her wound. We are still seeing some signs of improvement. Fortunately there does not appear to be any evidence of active infection at this time which is good news she did see the doctors over at [facility] yesterday and they are ordering an MRI in preparation for considering possibility of a surgical closure. The patient voiced understanding she had forgotten we have made this referral. T be o honest I was back in February kind of forgot about the  referral to in the interim trying to proceed with her wound care. Nonetheless I appreciate their be evaluation of this and again we will see what the MRI shows. 06-19-2022 upon evaluation today patient's wound is actually showing signs of good improvement and very pleased with where we stand today. Fortunately I do not see any evidence of infection locally nor systemically which is great news I think the wound VAC is doing a good job of undermining seems less than last week. 06-26-2022 upon evaluation today patient appears to be doing well with regard to her wound although her wound VAC it actually died she had forgotten to charge it overnight. With that being said she does seem to be doing decently well at this point based on what I am seeing I think that the undermining is much less than what it was previous. Fortunately I do not see any signs of active infection locally nor systemically at this time. Objective Constitutional Well-nourished and well-hydrated in no acute distress. Vitals Time Taken: 1:43 PM, Height: 64 in, Weight: 97 lbs, BMI: 16.6, Temperature: 98.5 F, Pulse: 67 bpm, Respiratory Rate: 16 breaths/min, Blood  Pressure: 125/60 mmHg. Respiratory normal breathing without difficulty. Psychiatric this patient is able to make decisions and demonstrates good insight into disease process. Alert and Oriented x 3. pleasant and cooperative. General Notes: Upon inspection patient's wound bed actually showed signs of good granulation epithelization at this point. Fortunately I do not see any signs of worsening in general and I think again that the patient's undermining is actually improved which is great news I am hopeful this will continue to show signs of improvement ongoing. Integumentary (Hair, Skin) Wound #1 status is Open. Original cause of wound was Pressure Injury. The date acquired was: 10/09/2021. The wound has been in treatment 25 weeks. The wound is located on the Sacrum. The wound  measures 0.4cm length x 0.2cm width x 1cm depth; 0.063cm^2 area and 0.063cm^3 volume. There is tunneling at 2:00 with a maximum distance of 1.4cm. There is a medium amount of serosanguineous drainage noted. There is large (67-100%) granulation within the wound bed. There is no necrotic tissue within the wound bed. The periwound skin appearance had no abnormalities noted for texture. The periwound skin appearance had no abnormalities noted for moisture. The periwound skin appearance had no abnormalities noted for color. Assessment Active Problems ICD-10 Pressure ulcer of sacral region, stage 4 Muscle weakness (generalized) Unspecified severe protein-calorie malnutrition Plan Follow-up Appointments: Return Appointment in 1 week. Marissa Silk ST and Bobbi Rm # 8 WEdnesday 07/03/22 @ 11:00 one Nurse Visit: - FRiday 06/28/22 @ Cardenas Monday 07/01/22 @ 7:45 FRiday 07/05/22 @ 11:30 Other: - Administrator Jonette Eva) : [phone] PCR culture negative-no Jodie Echevaria ordered. Regular culture negative as of 05/15/22 WE are ordering an x-ray for pt. We are sending the prescription for it with pt. It can be done at any [facility] facility. NO appointment necessary. Just take prescription in and hand it to front desk. Please Make sure wound Vac is always charged**** Anesthetic: (In clinic) Topical Lidocaine 5% applied to wound bed Negative Presssure Wound Therapy: Wound Vac to wound continuously at 174mm/hg pressure - Apply gauze wound vac kit to wound. Change 3 x a week. Apply gentamicin first to wound bed. Black and White Foam combination - gauze kit medela wound vac- order through wound Q Off-Loading: Turn and reposition every 2 hours WOUND #1: - Sacrum Wound Laterality: Cleanser: Wound Cleanser (Home Health) 3 x Per Week/30 Days Discharge Instructions: Cleanse the wound with wound cleanser prior to applying a clean dressing using gauze sponges, not tissue or cotton balls. Peri-Wound Care: Skin Prep (Home  Health) 3 x Per Week/30 Days Discharge Instructions: Use skin prep as directed MYFANWY, SIMKIN ([id number]) [id number] Page 7 of 7 Topical: Gentamicin 3 x Per Week/30 Days Discharge Instructions: As directed by physician Prim Dressing: Hydrofera Blue Classic Foam Rope Dressing, 9x6 (mm/in) 3 x Per Week/30 Days ary Discharge Instructions: Moisten with vashe and pack into wound. Reapply wound vac Friday. 1. I am good recommend based on what we are seeing that we have the patient continue to utilize the wound VAC although we cannot put it on today we will continue with the Hydrofera Blue rope for today and we will see her for the wound VAC replacement on Friday. 2. Will get a continue specifically with the gauze back which I think is doing excellent. 3. I would also suggest that the patient should continue to monitor for any signs of worsening in general obviously if anything changes she knows contact the office and let me know. We will see patient back for  reevaluation in 1 week here in the clinic. If anything worsens or changes patient will contact our office for additional recommendations. Electronic Signature(s) Signed: 06/26/2022 2:54:31 PM By: Allen Derry PA-C Entered By: Allen Derry on 06/26/2022 14:54:31 -------------------------------------------------------------------------------- SuperBill Details Patient Name: Date of Service: Marissa Marissa Goody Cardenas. 06/26/2022 Medical Record Number: [id number] Patient Account Number: [id number] Date of Birth/Sex: Treating RN: 05/16/1950 (72 y.o. F) Primary Care Provider: Jayme Cloud., [name] Other Clinician: Referring Provider: Treating Provider/Extender: Crista Curb in Treatment: 25 Diagnosis Coding ICD-10 Codes Code Description [phone] Pressure ulcer of sacral region, stage 4 M62.81 Muscle weakness (generalized) E43 Unspecified severe protein-calorie malnutrition Facility  Procedures : CPT4 Code: [id number] Description: 99213 - WOUND CARE VISIT-LEV 3 EST PT Modifier: Quantity: 1 Physician Procedures : CPT4 Code Description Modifier [id number] 99214 - WC PHYS LEVEL 4 - EST PT ICD-10 Diagnosis Description L89.154 Pressure ulcer of sacral region, stage 4 M62.81 Muscle weakness (generalized) E43 Unspecified severe protein-calorie malnutrition Quantity: 1 Electronic Signature(s) Signed: 07/02/2022 3:46:00 PM By: Fonnie Mu RN Signed: 09/09/2022 3:13:10 PM By: Allen Derry PA-C Previous Signature: 06/26/2022 3:03:03 PM Version By: Allen Derry PA-C Entered By: Fonnie Mu on 07/02/2022 09:45:37

## 2022-06-27 NOTE — Progress Notes (Signed)
XANA, BRADT ([id number]) [id number] Page 1 of 1 Visit Report for 06/24/2022 SuperBill Details Patient Name: Date of Service: [name] 06/24/2022 Medical Record Number: [id number] Patient Account Number: [id number] Date of Birth/Sex: Treating RN: [birth year] (72 y.o. [name] Primary Care Provider: Jayme Cloud., [name] Other Clinician: Referring Provider: Treating Provider/Extender: Allena Katz., [name] in Treatment: 24 Diagnosis Coding ICD-10 Codes Code Description [phone] Pressure ulcer of sacral region, stage 4 M62.81 Muscle weakness (generalized) E43 Unspecified severe protein-calorie malnutrition Facility Procedures CPT4 Code Description Modifier Quantity [id number] [phone] NEG PRESS WND TX <=50 SQ CM 1 ICD-10 Diagnosis Description L89.154 Pressure ulcer of sacral region, stage 4 Electronic Signature(s) Signed: 06/24/2022 11:42:23 AM By: Geralyn Corwin DO Signed: 06/27/2022 1:56:54 PM By: Brenton Grills Entered By: Brenton Grills on 06/24/2022 08:47:26

## 2022-06-27 NOTE — Progress Notes (Signed)
CHARLSIE, FLEEGER R ([id number]) [id number] Page 1 of 1 Visit Report for 05/31/2022 SuperBill Details Patient Name: Date of Service: [name] 05/31/2022 Medical Record Number: [id number] Patient Account Number: [id number] Date of Birth/Sex: Treating RN: [birth year] (72 y.o. [name] Primary Care Provider: Jayme Cloud., [name] Other Clinician: Referring Provider: Treating Provider/Extender: Allena Katz., [name] in Treatment: 21 Diagnosis Coding ICD-10 Codes Code Description [phone] Pressure ulcer of sacral region, stage 4 M62.81 Muscle weakness (generalized) E43 Unspecified severe protein-calorie malnutrition Facility Procedures CPT4 Code Description Modifier Quantity [id number] [phone] - WOUND CARE VISIT-LEV 2 EST PT 1 Electronic Signature(s) Signed: 05/31/2022 9:56:06 AM By: Geralyn Corwin DO Signed: 06/27/2022 1:55:51 PM By: Brenton Grills Entered By: Brenton Grills on 05/31/2022 08:54:50

## 2022-06-27 NOTE — Progress Notes (Signed)
[name], [name] ([id number]) [id number] Page 1 of 3 Visit Report for 06/24/2022 Arrival Information Details Patient Name: Date of Service: [name] 06/24/2022 7:45 A M Medical Record Number: [id number] Patient Account Number: [id number] Date of Birth/Sex: Treating RN: 1950-10-08 (72 y.o. [name] Primary Care Nazeer Romney: Jayme Cloud., [name] Other Clinician: Referring Siana Panameno: Treating Izabell Schalk/Extender: Allena Katz., [name] in Treatment: 24 Visit Information History Since Last Visit All ordered tests and consults were completed: Yes Patient Arrived: Ambulatory Added or deleted any medications: No Arrival Time: 08:03 Any new allergies or adverse reactions: No Accompanied By: self Had a fall or experienced change in No Transfer Assistance: None activities of daily living that may affect Patient Identification Verified: Yes risk of falls: Secondary Verification Process Completed: Yes Signs or symptoms of abuse/neglect since last visito No Patient Requires Transmission-Based Precautions: No Hospitalized since last visit: No Patient Has Alerts: No Implantable device outside of the clinic excluding No cellular tissue based products placed in the center since last visit: Pain Present Now: No Electronic Signature(s) Signed: 06/27/2022 1:56:54 PM By: Brenton Grills Entered By: Brenton Grills on 06/24/2022 08:04:30 -------------------------------------------------------------------------------- Encounter Discharge Information Details Patient Name: Date of Service: Marissa Berthold Cardenas. 06/24/2022 7:45 A M Medical Record Number: [id number] Patient Account Number: [id number] Date of Birth/Sex: Treating RN: 05-15-1950 (72 y.o. [name] Primary Care Camaryn Lumbert: Jayme Cloud., [name] Other Clinician: Referring Jamiee Milholland: Treating Unique Searfoss/Extender: Allena Katz., [name] in Treatment: 24 Encounter  Discharge Information Items Discharge Condition: Stable Ambulatory Status: Ambulatory Discharge Destination: Skilled Nursing Facility Telephoned: No Orders Sent: Yes Transportation: Private Auto Accompanied By: self Schedule Follow-up Appointment: Yes Clinical Summary of Care: Patient Declined Electronic Signature(s) Signed: 06/27/2022 1:56:54 PM By: Brenton Grills Entered By: Brenton Grills on 06/24/2022 08:46:29 -------------------------------------------------------------------------------- Negative Pressure Wound Therapy Maintenance (NPWT) Details Patient Name: Date of Service: [name] 06/24/2022 7:45 A M Medical Record Number: [id number] Patient Account Number: [id number] Date of Birth/Sex: Treating RN: [birth year] (72 y.o. [name] Primary Care Kassondra Geil: Jayme Cloud., [name] Other Clinician: Referring Charlie Char: Treating Lakasha Mcfall/Extender: Allena Katz., [name] in Treatment: 24 NPWT Maintenance Performed for: Wound #1 Sacrum Performed By: Brenton Grills, RN [name] ([id number]) [phone].pdf Page 2 of 3 Coverage Size (sq cm): 0.06 Pressure Type: Constant Pressure Setting: 125 mmHG Drain Type: None Primary Contact: Non-Adherent Sponge/Dressing Type: Foam- Black Date Initiated: 06/05/2022 Dressing Removed: Yes Quantity of Sponges/Gauze Removed: x1 Canister Changed: Yes Canister Exudate Volume: 0 Dressing Reapplied: Yes Quantity of Sponges/Gauze Inserted: x1 bridge to left hip Days On NPWT : 20 Electronic Signature(s) Signed: 06/27/2022 1:56:54 PM By: Brenton Grills Entered By: Brenton Grills on 06/24/2022 08:54:56 -------------------------------------------------------------------------------- Patient/Caregiver Education Details Patient Name: Date of Service: [name] 4/15/2024andnbsp7:45 A M Medical Record Number: [id number] Patient Account Number: [id number] Date of Birth/Gender:  Treating RN: 09-10-1950 (72 y.o. [name] Primary Care Physician: Jayme Cloud., [name] Other Clinician: Referring Physician: Treating Physician/Extender: Allena Katz., [name] in Treatment: 24 Education Assessment Education Provided To: Patient Education Topics Provided Wound/Skin Impairment: Methods: Explain/Verbal Responses: Reinforcements needed, State content correctly Electronic Signature(s) Signed: 06/27/2022 1:56:54 PM By: Brenton Grills Entered By: Brenton Grills on 06/24/2022 08:46:04 -------------------------------------------------------------------------------- Wound Assessment Details Patient Name: Date of Service: Marissa Marissa Goody Cardenas. 06/24/2022 7:45 A M Medical Record Number: [id number] Patient Account Number: [id number] Date of Birth/Sex: Treating RN: 1950/06/14 (72 y.o. [name] Primary Care Mao Lockner: Malen Gauze  Greer Pickerel Other Clinician: Referring Carston Riedl: Treating [place] Goh/Extender: Allena Katz., [name] in Treatment: 24 Wound Status Wound Number: 1 Primary Etiology: Pressure Ulcer Wound Location: Sacrum Wound Status: Open Wounding Event: Pressure Injury Comorbid History: Colitis Date Acquired: 10/09/2021 Weeks Of Treatment: 24 Clustered Wound: No Wound Measurements Length: (cm) 0.3 Width: (cm) 0.2 Depth: (cm) 1 Area: (cm) 0.047 Volume: (cm) 0.047 [name], [name] ([id number]) % Reduction in Area: 86.7% % Reduction in Volume: 81% Tunneling: No Undermining: No [id number] Page 3 of 3 Wound Description Classification: Category/Stage IV Exudate Amount: Medium Exudate Type: Serosanguineous Exudate Color: red, brown Foul Odor After Cleansing: No Slough/Fibrino No Periwound Skin Texture Texture Color No Abnormalities Noted: Yes No Abnormalities Noted: Yes Moisture No Abnormalities Noted: Yes Electronic Signature(s) Signed: 06/27/2022 1:56:54 PM By: Brenton Grills Entered By: Brenton Grills on 06/24/2022 08:44:27 -------------------------------------------------------------------------------- Vitals Details Patient Name: Date of Service: Marissa Berthold Cardenas. 06/24/2022 7:45 A M Medical Record Number: [id number] Patient Account Number: [id number] Date of Birth/Sex: Treating RN: 1950-07-28 (72 y.o. [name] Primary Care Stockton Nunley: Jayme Cloud., [name] Other Clinician: Referring Dawnelle Warman: Treating Joshlyn Beadle/Extender: Allena Katz., [name] in Treatment: 24 Vital Signs Time Taken: 08:04 Temperature (F): 98.1 Height (in): 64 Pulse (bpm): 70 Weight (lbs): 97 Respiratory Rate (breaths/min): 18 Body Mass Index (BMI): 16.6 Blood Pressure (mmHg): 120/80 Reference Range: 80 - 120 mg / dl Electronic Signature(s) Signed: 06/27/2022 1:56:54 PM By: Brenton Grills Entered By: Brenton Grills on 06/24/2022 08:05:01

## 2022-06-27 NOTE — Progress Notes (Signed)
[name] ([id number]) [id number] Page 1 of 4 Visit Report for 05/17/2022 Arrival Information Details Patient Name: Date of Service: [name] 05/17/2022 7:45 A M Medical Record Number: [id number] Patient Account Number: [id number] Date of Birth/Sex: Treating RN: 1950/05/20 (72 y.o. [name] Primary Care [name]: [name]., [name] Other Clinician: Referring [name]: Treating [name]/Extender: Marissa Katz., [name] in Treatment: 19 Visit Information History Since Last Visit All ordered tests and consults were completed: Yes Patient Arrived: Ambulatory Added or deleted any medications: No Arrival Time: 08:00 Any new allergies or adverse reactions: No Accompanied By: caregiver Had a fall or experienced change in No Transfer Assistance: None activities of daily living that may affect Patient Identification Verified: Yes risk of falls: Secondary Verification Process Completed: Yes Signs or symptoms of abuse/neglect since last visito No Patient Requires Transmission-Based Precautions: No Hospitalized since last visit: No Patient Has Alerts: No Implantable device outside of the clinic excluding No cellular tissue based products placed in the center since last visit: Has Dressing in Place as Prescribed: Yes Pain Present Now: No Electronic Signature(s) Signed: 06/27/2022 1:55:51 PM By: [name] Entered By: [name] on 05/17/2022 09:07:19 -------------------------------------------------------------------------------- Clinic Level of Care Assessment Details Patient Name: Date of Service: [name] 05/17/2022 7:45 A M Medical Record Number: [id number] Patient Account Number: [id number] Date of Birth/Sex: Treating RN: 03-May-1950 (72 y.o. [name], [name] Primary Care Shakirra Buehler: [name]., [name] Other Clinician: Referring Philomena Buttermore: Treating Cindy Brindisi/Extender: Marissa Katz.,  [name] in Treatment: 19 Clinic Level of Care Assessment Items TOOL 4 Quantity Score X- 1 0 Use when only an EandM is performed on FOLLOW-UP visit ASSESSMENTS - Nursing Assessment / Reassessment X- 1 10 Reassessment of Marissa-morbidities (includes updates in patient status) X- 1 5 Reassessment of Adherence to Treatment Plan ASSESSMENTS - Wound and Skin A ssessment / Reassessment X - Simple Wound Assessment / Reassessment - one wound 1 5  - 0 Complex Wound Assessment / Reassessment - multiple wounds  - 0 Dermatologic / Skin Assessment (not related to wound area) ASSESSMENTS - Focused Assessment  - 0 Circumferential Edema Measurements - multi extremities  - 0 Nutritional Assessment / Counseling / Intervention  - 0 Lower Extremity Assessment (monofilament, tuning fork, pulses)  - 0 Peripheral Arterial Disease Assessment (using hand held doppler) ASSESSMENTS - Ostomy and/or Continence Assessment and Care  - 0 Incontinence Assessment and Management  - 0 Ostomy Care Assessment and Management (repouching, etc.) PROCESS - Coordination of Care [name] ([id number]) [id number] Page 2 of 4 X- 1 15 Simple Patient / Family Education for ongoing care  - 0 Complex (extensive) Patient / Family Education for ongoing care X- 1 10 Staff obtains Chiropractor, Records, T Results / Process Orders est  - 0 Staff telephones HHA, Nursing Homes / Clarify orders / etc  - 0 Routine Transfer to another Facility (non-emergent condition)  - 0 Routine Hospital Admission (non-emergent condition)  - 0 New Admissions / Manufacturing engineer / Ordering NPWT Apligraf, etc. ,  - 0 Emergency Hospital Admission (emergent condition) X- 1 10 Simple Discharge Coordination  - 0 Complex (extensive) Discharge Coordination PROCESS - Special Needs  - 0 Pediatric / Minor Patient Management  - 0 Isolation Patient Management  -  0 Hearing / Language / Visual special needs  - 0 Assessment of Community assistance (transportation, D/C planning, etc.)  - 0 Additional assistance / Altered mentation  - 0 Support Surface(s) Assessment (bed, cushion,  seat, etc.) INTERVENTIONS - Wound Cleansing / Measurement X - Simple Wound Cleansing - one wound 1 5  - 0 Complex Wound Cleansing - multiple wounds  - 0 Wound Imaging (photographs - any number of wounds)  - 0 Wound Tracing (instead of photographs)  - 0 Simple Wound Measurement - one wound  - 0 Complex Wound Measurement - multiple wounds INTERVENTIONS - Wound Dressings X - Small Wound Dressing one or multiple wounds 1 10  - 0 Medium Wound Dressing one or multiple wounds  - 0 Large Wound Dressing one or multiple wounds  - 0 Application of Medications - topical  - 0 Application of Medications - injection INTERVENTIONS - Miscellaneous  - 0 External ear exam  - 0 Specimen Collection (cultures, biopsies, blood, body fluids, etc.)  - 0 Specimen(s) / Culture(s) sent or taken to Lab for analysis  - 0 Patient Transfer (multiple staff / Nurse, adult / Similar devices)  - 0 Simple Staple / Suture removal (25 or less)  - 0 Complex Staple / Suture removal (26 or more)  - 0 Hypo / Hyperglycemic Management (close monitor of Blood Glucose)  - 0 Ankle / Brachial Index (ABI) - do not check if billed separately  - 0 Vital Signs Has the patient been seen at the hospital within the last three years: Yes Total Score: 70 Level Of Care: New/Established - Level 2 Electronic Signature(s) Signed: 05/17/2022 3:03:33 PM By: Shawn Stall RN, BSN Spencer, [name] ([id number]) [id number] Page 3 of 4 Entered By: Shawn Stall on 05/17/2022 12:40:39 -------------------------------------------------------------------------------- Encounter Discharge Information Details Patient Name: Date of Service: Marissa [name] 05/17/2022 7:45 A M Medical Record Number: [id number] Patient Account Number: [id number] Date of Birth/Sex: Treating RN: 03-14-1950 (72 y.o. [name] Primary Care Dsean Vantol: [name]., [name] Other Clinician: Referring Shia Eber: Treating Jerie Basford/Extender: Marissa Katz., [name] in Treatment: 19 Encounter Discharge Information Items Discharge Condition: Stable Ambulatory Status: Ambulatory Discharge Destination: Home Transportation: Private Auto Accompanied By: caregiver Schedule Follow-up Appointment: Yes Clinical Summary of Care: Patient Declined Electronic Signature(s) Signed: 06/27/2022 1:55:51 PM By: [name] Entered By: [name] on 05/17/2022 09:11:23 -------------------------------------------------------------------------------- Patient/Caregiver Education Details Patient Name: Date of Service: [name] 3/8/2024andnbsp7:45 A M Medical Record Number: [id number] Patient Account Number: [id number] Date of Birth/Gender: Treating RN: 06-06-1950 (72 y.o. [name] Primary Care Physician: [name]., [name] Other Clinician: Referring Physician: Treating Physician/Extender: Marissa Katz., [name] in Treatment: 19 Education Assessment Education Provided To: Caregiver Education Topics Provided Wound/Skin Impairment: Handouts: Caring for Your Ulcer Electronic Signature(s) Signed: 06/27/2022 1:55:51 PM By: [name] Entered By: [name] on 05/17/2022 09:10:57 -------------------------------------------------------------------------------- Wound Assessment Details Patient Name: Date of Service: Marissa Marissa Goody Cardenas. 05/17/2022 7:45 A M Medical Record Number: [id number] Patient Account Number: [id number] Date of Birth/Sex: Treating RN: 1951-01-08 (72 y.o. [name] Primary Care Noriel Guthrie: [name]., [name] Other Clinician: Referring Timarie Labell: Treating Willaim Mode/Extender: Marissa Katz., [name] in Treatment: 19 Wound Status Wound Number: 1 Primary Etiology: Pressure Ulcer Wound Location: Sacrum Wound Status: Open Wounding Event: Pressure Injury Comorbid History: Colitis Date Acquired: 10/09/2021 Weeks Of Treatment: 19 Clustered Wound: No [name], [name] ([id number]) [phone].pdf Page 4 of 4 Wound Measurements Length: (cm) 0.4 Width: (cm) 0.4 Depth: (cm) 1.2 Area: (cm) 0.126 Volume: (cm) 0.151 % Reduction in Area: 64.3% % Reduction in Volume: 38.9% Tunneling: No Undermining: No Wound Description Classification: Category/Stage IV Wound Margin: Distinct, outline  attached Exudate Amount: Medium Exudate Type: Purulent Exudate Color: yellow, brown, green Wound Bed Granulation Amount: Large (67-100%) Exposed Structure Granulation Quality: Red, Pink Fascia Exposed: No Necrotic Amount: Small (1-33%) Fat Layer (Subcutaneous Tissue) Exposed: No Necrotic Quality: Adherent Slough Tendon Exposed: No Muscle Exposed: No Joint Exposed: No Bone Exposed: No Periwound Skin Texture Texture Color No Abnormalities Noted: No No Abnormalities Noted: No Callus: No Atrophie Blanche: No Crepitus: No Cyanosis: No Excoriation: No Ecchymosis: No Induration: No Erythema: No Rash: No Hemosiderin Staining: No Scarring: No Mottled: No Pallor: No Moisture Rubor: No No Abnormalities Noted: No Dry / Scaly: No Temperature / Pain Maceration: No Temperature: No Abnormality Tenderness on Palpation: Yes Electronic Signature(s) Signed: 06/27/2022 1:55:51 PM By: [name] Entered By: [name] on 05/17/2022 09:09:08 -------------------------------------------------------------------------------- Vitals Details Patient Name: Date of Service: Marissa Berthold Cardenas. 05/17/2022 7:45 A M Medical Record Number: [id number] Patient Account Number: [id number] Date of Birth/Sex: Treating RN: 1951-02-09 (72 y.o. [name] Primary Care Shoshana Johal: [name]., [name] Other Clinician: Referring Teniyah Seivert: Treating Gurleen Larrivee/Extender: Marissa Katz., [name] in Treatment: 19 Vital Signs Time Taken: 08:00 Temperature (F): 98.1 Height (in): 64 Pulse (bpm): 69 Weight (lbs): 97 Respiratory Rate (breaths/min): 16 Body Mass Index (BMI): 16.6 Blood Pressure (mmHg): 112/64 Reference Range: 80 - 120 mg / dl Electronic Signature(s) Signed: 06/27/2022 1:55:51 PM By: [name] Entered By: [name] on 05/17/2022 09:08:00

## 2022-06-27 NOTE — Progress Notes (Signed)
AFFIE, GASNER R ([id number]) [id number] Page 1 of 6 Visit Report for 05/29/2022 Arrival Information Details Patient Name: Date of Service: [name]. 05/29/2022 8:00 A M Medical Record Number: [id number] Patient Account Number: [id number] Date of Birth/Sex: Treating RN: [birth year] (72 y.o. [name] Primary Care Revella Shelton: Jayme Cloud., [name] Other Clinician: Referring Suri Tafolla: Treating Corra Kaine/Extender: Crista Curb in Treatment: 21 Visit Information History Since Last Visit Added or deleted any medications: No Patient Arrived: Ambulatory Any new allergies or adverse reactions: No Arrival Time: 08:02 Had a fall or experienced change in No Accompanied By: self activities of daily living that may affect Transfer Assistance: None risk of falls: Patient Identification Verified: Yes Signs or symptoms of abuse/neglect since last visito No Secondary Verification Process Completed: Yes Hospitalized since last visit: No Patient Requires Transmission-Based Precautions: No Implantable device outside of the clinic excluding No Patient Has Alerts: No cellular tissue based products placed in the center since last visit: Has Dressing in Place as Prescribed: Yes Pain Present Now: No Electronic Signature(s) Signed: 06/27/2022 1:55:51 PM By: Brenton Grills Entered By: Brenton Grills on 05/29/2022 08:05:25 -------------------------------------------------------------------------------- Clinic Level of Care Assessment Details Patient Name: Date of Service: [name] 05/29/2022 8:00 A M Medical Record Number: [id number] Patient Account Number: [id number] Date of Birth/Sex: Treating RN: 1950/05/12 (72 y.o. [name], [name] Primary Care Yurem Viner: Jayme Cloud., [name] Other Clinician: Referring Lesia Monica: Treating Dayanira Giovannetti/Extender: Crista Curb in Treatment: 21 Clinic Level of Care  Assessment Items TOOL 4 Quantity Score X- 1 0 Use when only an EandM is performed on FOLLOW-UP visit ASSESSMENTS - Nursing Assessment / Reassessment X- 1 10 Reassessment of Co-morbidities (includes updates in patient status) X- 1 5 Reassessment of Adherence to Treatment Plan ASSESSMENTS - Wound and Skin A ssessment / Reassessment X - Simple Wound Assessment / Reassessment - one wound 1 5 []  - 0 Complex Wound Assessment / Reassessment - multiple wounds []  - 0 Dermatologic / Skin Assessment (not related to wound area) ASSESSMENTS - Focused Assessment []  - 0 Circumferential Edema Measurements - multi extremities []  - 0 Nutritional Assessment / Counseling / Intervention []  - 0 Lower Extremity Assessment (monofilament, tuning fork, pulses) []  - 0 Peripheral Arterial Disease Assessment (using hand held doppler) ASSESSMENTS - Ostomy and/or Continence Assessment and Care []  - 0 Incontinence Assessment and Management []  - 0 Ostomy Care Assessment and Management (repouching, etc.) PROCESS - Coordination of Care X - Simple Patient / Family Education for ongoing care 1 15 Acworth, Jasper R ([id number]) [phone].pdf Page 2 of 6 []  - 0 Complex (extensive) Patient / Family Education for ongoing care X- 1 10 Staff obtains Chiropractor, Records, T Results / Process Orders est []  - 0 Staff telephones HHA, Nursing Homes / Clarify orders / etc []  - 0 Routine Transfer to another Facility (non-emergent condition) []  - 0 Routine Hospital Admission (non-emergent condition) []  - 0 New Admissions / Manufacturing engineer / Ordering NPWT Apligraf, etc. , []  - 0 Emergency Hospital Admission (emergent condition) X- 1 10 Simple Discharge Coordination []  - 0 Complex (extensive) Discharge Coordination PROCESS - Special Needs []  - 0 Pediatric / Minor Patient Management []  - 0 Isolation Patient Management []  - 0 Hearing / Language / Visual special needs []  -  0 Assessment of Community assistance (transportation, D/C planning, etc.) []  - 0 Additional assistance / Altered mentation []  - 0 Support Surface(s) Assessment (bed, cushion, seat, etc.) INTERVENTIONS - Wound  Cleansing / Measurement X - Simple Wound Cleansing - one wound 1 5  - 0 Complex Wound Cleansing - multiple wounds X- 1 5 Wound Imaging (photographs - any number of wounds)  - 0 Wound Tracing (instead of photographs) X- 1 5 Simple Wound Measurement - one wound  - 0 Complex Wound Measurement - multiple wounds INTERVENTIONS - Wound Dressings X - Small Wound Dressing one or multiple wounds 1 10  - 0 Medium Wound Dressing one or multiple wounds  - 0 Large Wound Dressing one or multiple wounds X- 1 5 Application of Medications - topical  - 0 Application of Medications - injection INTERVENTIONS - Miscellaneous  - 0 External ear exam  - 0 Specimen Collection (cultures, biopsies, blood, body fluids, etc.)  - 0 Specimen(s) / Culture(s) sent or taken to Lab for analysis  - 0 Patient Transfer (multiple staff / Nurse, adult / Similar devices)  - 0 Simple Staple / Suture removal (25 or less)  - 0 Complex Staple / Suture removal (26 or more)  - 0 Hypo / Hyperglycemic Management (close monitor of Blood Glucose)  - 0 Ankle / Brachial Index (ABI) - do not check if billed separately X- 1 5 Vital Signs Has the patient been seen at the hospital within the last three years: Yes Total Score: 90 Level Of Care: New/Established - Level 3 Electronic Signature(s) Signed: 05/31/2022 11:12:19 AM By: Fonnie Mu RN Entered By: Fonnie Mu on 05/29/2022 08:51:20 [name] R ([id number]) [id number] Page 3 of 6 -------------------------------------------------------------------------------- Encounter Discharge Information Details Patient Name: Date of Service: [name] 05/29/2022 8:00 A M Medical Record  Number: [id number] Patient Account Number: [id number] Date of Birth/Sex: Treating RN: 1950-03-18 (72 y.o. [name], [name] Primary Care Zarah Carbon: Jayme Cloud., [name] Other Clinician: Referring Preciosa Bundrick: Treating Sophea Rackham/Extender: Crista Curb in Treatment: 21 Encounter Discharge Information Items Discharge Condition: Stable Ambulatory Status: Ambulatory Discharge Destination: Home Transportation: Private Auto Accompanied By: self Schedule Follow-up Appointment: No Clinical Summary of Care: Patient Declined Electronic Signature(s) Signed: 05/31/2022 11:12:19 AM By: Fonnie Mu RN Entered By: Fonnie Mu on 05/29/2022 09:21:12 -------------------------------------------------------------------------------- Lower Extremity Assessment Details Patient Name: Date of Service: [name], Brooks Sailors R. 05/29/2022 8:00 A M Medical Record Number: [id number] Patient Account Number: [id number] Date of Birth/Sex: Treating RN: 1950-10-30 (72 y.o. [name] Primary Care Corayma Cashatt: Jayme Cloud., [name] Other Clinician: Referring Herschel Fleagle: Treating Draylon Mercadel/Extender: Crista Curb in Treatment: 21 Electronic Signature(s) Signed: 06/27/2022 1:55:51 PM By: Brenton Grills Entered By: Brenton Grills on 05/29/2022 08:06:07 -------------------------------------------------------------------------------- Multi-Disciplinary Care Plan Details Patient Name: Date of Service: [name] 05/29/2022 8:00 A M Medical Record Number: [id number] Patient Account Number: [id number] Date of Birth/Sex: Treating RN: 05/15/1950 (72 y.o. [name], [name] Primary Care Alvino Lechuga: Jayme Cloud., [name] Other Clinician: Referring Reign Dziuba: Treating Daveena Elmore/Extender: Crista Curb in Treatment: 21 Active Inactive Pain, Acute or Chronic Nursing Diagnoses: Pain, acute or chronic: actual or potential Potential  alteration in comfort, pain Goals: Patient will verbalize adequate pain control and receive pain control interventions during procedures as needed Date Initiated: 01/02/2022 Target Resolution Date: 06/08/2022 Goal Status: Active Patient/caregiver will verbalize adequate pain control between visits Date Initiated: 01/02/2022 Target Resolution Date: 06/08/2022 Goal Status: Active Interventions: Encourage patient to take pain medications as prescribed KARISS, LONGMIRE R ([id number]) [phone].pdf Page 4 of 6 Provide education on pain management Reposition patient for comfort Treatment Activities: Administer pain control  measures as ordered : 01/02/2022 Notes: Pressure Nursing Diagnoses: Knowledge deficit related to management of pressures ulcers Goals: Patient/caregiver will verbalize risk factors for pressure ulcer development Date Initiated: 01/02/2022 Target Resolution Date: 06/08/2022 Goal Status: Active Interventions: Assess: immobility, friction, shearing, incontinence upon admission and as needed Assess offloading mechanisms upon admission and as needed Provide education on pressure ulcers Treatment Activities: T ordered outside of clinic : 01/02/2022 est Notes: Electronic Signature(s) Signed: 05/31/2022 11:12:19 AM By: Fonnie Mu RN Entered By: Fonnie Mu on 05/29/2022 08:24:02 -------------------------------------------------------------------------------- Pain Assessment Details Patient Name: Date of Service: [name] 05/29/2022 8:00 A M Medical Record Number: [id number] Patient Account Number: [id number] Date of Birth/Sex: Treating RN: 05-10-1950 (72 y.o. [name] Primary Care Savyon Loken: Jayme Cloud., [name] Other Clinician: Referring Micholas Drumwright: Treating Terri Malerba/Extender: Crista Curb in Treatment: 21 Active Problems Location of Pain Severity and Description of Pain Patient Has Paino  No Site Locations Pain Management and Medication Current Pain Management: Electronic Signature(s) Signed: 06/27/2022 1:55:51 PM By: Brenton Grills Entered By: Brenton Grills on 05/29/2022 08:06:01 [name] ([id number]) [id number].pdf Page 5 of 6 -------------------------------------------------------------------------------- Patient/Caregiver Education Details Patient Name: Date of Service: [name], JA NEY R. 3/20/2024andnbsp8:00 A M Medical Record Number: [id number] Patient Account Number: [id number] Date of Birth/Gender: Treating RN: 05-05-1950 (72 y.o. [name], [name] Primary Care Physician: Jayme Cloud., [name] Other Clinician: Referring Physician: Treating Physician/Extender: Crista Curb in Treatment: 21 Education Assessment Education Provided To: Patient Education Topics Provided Wound/Skin Impairment: Methods: Explain/Verbal Responses: Reinforcements needed, State content correctly [employer]) Signed: 05/31/2022 11:12:19 AM By: Fonnie Mu RN Entered By: Fonnie Mu on 05/29/2022 08:24:14 -------------------------------------------------------------------------------- Wound Assessment Details Patient Name: Date of Service: [name] 05/29/2022 8:00 A M Medical Record Number: [id number] Patient Account Number: [id number] Date of Birth/Sex: Treating RN: February 12, 1951 (72 y.o. [name] Primary Care Missey Hasley: Jayme Cloud., [name] Other Clinician: Referring Naman Spychalski: Treating Jarred Purtee/Extender: Crista Curb in Treatment: 21 Wound Status Wound Number: 1 Primary Etiology: Pressure Ulcer Wound Location: Sacrum Wound Status: Open Wounding Event: Pressure Injury Comorbid History: Colitis Date Acquired: 10/09/2021 Weeks Of Treatment: 21 Clustered Wound: No Photos Wound Measurements Length: (cm) 0.4 Width: (cm) 0.4 Depth: (cm) 1.2 Area: (cm)  0.12 Volume: (cm) 0.15 Rudnick, Otelia R ([id number]) Wound Description Classification: Category/Stage IV Wound Margin: Distinct, outline attached Exudate Amount: Medium Exudate Type: Serosanguineous Exudate Color: red, brown Foul Odor After Cleansing: Due to Product Use: Slough/Fibrino % Reduction in Area: 64.3% % Reduction in Volume: 38.9% Epithelialization: Small (1-33%) 6 Tunneling: No 1 Undermining: Yes Starting Position (o'clock): 12 Ending Position (o'clock): 12 Maximum Distance: (cm) 3.7 [id number] Page 6 of 6 Yes No Yes Wound Bed Granulation Amount: Large (67-100%) Exposed Structure Granulation Quality: Red, Pink Fascia Exposed: No Necrotic Amount: Small (1-33%) Fat Layer (Subcutaneous Tissue) Exposed: Yes Necrotic Quality: Adherent Slough Tendon Exposed: No Muscle Exposed: No Joint Exposed: No Bone Exposed: No Periwound Skin Texture Texture Color No Abnormalities Noted: No No Abnormalities Noted: No Callus: No Atrophie Blanche: No Crepitus: No Cyanosis: No Excoriation: No Ecchymosis: No Induration: No Erythema: No Rash: No Hemosiderin Staining: No Scarring: No Mottled: No Pallor: No Moisture Rubor: No No Abnormalities Noted: No Dry / Scaly: No Temperature / Pain Maceration: No Temperature: No Abnormality Tenderness on Palpation: Yes Electronic Signature(s) Signed: 05/31/2022 11:12:19 AM By: Fonnie Mu RN Signed: 06/27/2022 1:55:51 PM By: Brenton Grills Entered By: Fonnie Mu on  05/29/2022 08:15:14 -------------------------------------------------------------------------------- Vitals Details Patient Name: Date of Service: [name]. 05/29/2022 8:00 A M Medical Record Number: [id number] Patient Account Number: [id number] Date of Birth/Sex: Treating RN: [birth year] (72 y.o. [name] Primary Care Jdyn Parkerson: Jayme Cloud., [name] Other Clinician: Referring Adelae Yodice: Treating  Linlee Cromie/Extender: Crista Curb in Treatment: 21 Vital Signs Time Taken: 08:05 Temperature (F): 98.2 Height (in): 64 Pulse (bpm): 81 Weight (lbs): 97 Respiratory Rate (breaths/min): 17 Body Mass Index (BMI): 16.6 Blood Pressure (mmHg): 95/51 Reference Range: 80 - 120 mg / dl Electronic Signature(s) Signed: 06/27/2022 1:55:51 PM By: Brenton Grills Entered By: Brenton Grills on 05/29/2022 08:05:57

## 2022-06-27 NOTE — Progress Notes (Signed)
SHRAVYA, WICKWIRE Cardenas ([id number]) [id number] Page 1 of 4 Visit Report for 05/31/2022 Arrival Information Details Patient Name: Date of Service: [name]. 05/31/2022 9:00 A M Medical Record Number: [id number] Patient Account Number: [id number] Date of Birth/Sex: Treating RN: [birth year] (72 y.o. [name] Primary Care Yulanda Diggs: Jayme Cloud., [name] Other Clinician: Referring Tashiya Souders: Treating Kavaughn Faucett/Extender: Allena Katz., [name] in Treatment: 21 Visit Information History Since Last Visit All ordered tests and consults were completed: Yes Patient Arrived: Ambulatory Added or deleted any medications: No Arrival Time: 08:29 Any new allergies or adverse reactions: No Accompanied By: self Had a fall or experienced change in No Transfer Assistance: None activities of daily living that may affect Patient Identification Verified: Yes risk of falls: Secondary Verification Process Completed: Yes Signs or symptoms of abuse/neglect since last visito No Patient Requires Transmission-Based Precautions: No Hospitalized since last visit: No Patient Has Alerts: No Implantable device outside of the clinic excluding No cellular tissue based products placed in the center since last visit: Has Dressing in Place as Prescribed: Yes Pain Present Now: No Electronic Signature(s) Signed: 06/27/2022 1:55:51 PM By: Brenton Grills Entered By: Brenton Grills on 05/31/2022 08:29:55 -------------------------------------------------------------------------------- Clinic Level of Care Assessment Details Patient Name: Date of Service: [name]. 05/31/2022 9:00 A M Medical Record Number: [id number] Patient Account Number: [id number] Date of Birth/Sex: Treating RN: Mar 29, 1950 (72 y.o. [name] Primary Care Philis Doke: Jayme Cloud., [name] Other Clinician: Referring Uzma Hellmer: Treating Tayte Mcwherter/Extender: Allena Katz.,  [name] in Treatment: 21 Clinic Level of Care Assessment Items TOOL 4 Quantity Score X- 1 0 Use when only an EandM is performed on FOLLOW-UP visit ASSESSMENTS - Nursing Assessment / Reassessment X- 1 10 Reassessment of Co-morbidities (includes updates in patient status) X- 1 5 Reassessment of Adherence to Treatment Plan ASSESSMENTS - Wound and Skin A ssessment / Reassessment X - Simple Wound Assessment / Reassessment - one wound 1 5  - 0 Complex Wound Assessment / Reassessment - multiple wounds  - 0 Dermatologic / Skin Assessment (not related to wound area) ASSESSMENTS - Focused Assessment  - 0 Circumferential Edema Measurements - multi extremities  - 0 Nutritional Assessment / Counseling / Intervention  - 0 Lower Extremity Assessment (monofilament, tuning fork, pulses)  - 0 Peripheral Arterial Disease Assessment (using hand held doppler) ASSESSMENTS - Ostomy and/or Continence Assessment and Care  - 0 Incontinence Assessment and Management  - 0 Ostomy Care Assessment and Management (repouching, etc.) PROCESS - Coordination of Care RONALEE, SCHEUNEMANN Cardenas ([id number]) [id number] Page 2 of 4 X- 1 15 Simple Patient / Family Education for ongoing care  - 0 Complex (extensive) Patient / Family Education for ongoing care X- 1 10 Staff obtains Chiropractor, Records, T Results / Process Orders est  - 0 Staff telephones HHA, Nursing Homes / Clarify orders / etc  - 0 Routine Transfer to another Facility (non-emergent condition)  - 0 Routine Hospital Admission (non-emergent condition)  - 0 New Admissions / Manufacturing engineer / Ordering NPWT Apligraf, etc. ,  - 0 Emergency Hospital Admission (emergent condition) X- 1 10 Simple Discharge Coordination  - 0 Complex (extensive) Discharge Coordination PROCESS - Special Needs  - 0 Pediatric / Minor Patient Management  - 0 Isolation Patient Management  -  0 Hearing / Language / Visual special needs  - 0 Assessment of Community assistance (transportation, D/C planning, etc.)  - 0 Additional assistance / Altered mentation  - 0 Support Surface(s) Assessment (bed, cushion,  seat, etc.) INTERVENTIONS - Wound Cleansing / Measurement X - Simple Wound Cleansing - one wound 1 5  - 0 Complex Wound Cleansing - multiple wounds  - 0 Wound Imaging (photographs - any number of wounds)  - 0 Wound Tracing (instead of photographs)  - 0 Simple Wound Measurement - one wound  - 0 Complex Wound Measurement - multiple wounds INTERVENTIONS - Wound Dressings X - Small Wound Dressing one or multiple wounds 1 10  - 0 Medium Wound Dressing one or multiple wounds  - 0 Large Wound Dressing one or multiple wounds X- 1 5 Application of Medications - topical  - 0 Application of Medications - injection INTERVENTIONS - Miscellaneous  - 0 External ear exam  - 0 Specimen Collection (cultures, biopsies, blood, body fluids, etc.)  - 0 Specimen(s) / Culture(s) sent or taken to Lab for analysis  - 0 Patient Transfer (multiple staff / Nurse, adult / Similar devices)  - 0 Simple Staple / Suture removal (25 or less)  - 0 Complex Staple / Suture removal (26 or more)  - 0 Hypo / Hyperglycemic Management (close monitor of Blood Glucose)  - 0 Ankle / Brachial Index (ABI) - do not check if billed separately  - 0 Vital Signs Has the patient been seen at the hospital within the last three years: Yes Total Score: 75 Level Of Care: New/Established - Level 2 Electronic Signature(s) Signed: 06/27/2022 1:55:51 PM By: Torrie Mayers, Karel Jarvis ([id number]) [id number] Page 3 of 4 Entered By: Brenton Grills on 05/31/2022 08:54:39 -------------------------------------------------------------------------------- Encounter Discharge Information Details Patient Name: Date of Service: [name] Jarome Lamas  05/31/2022 9:00 A M Medical Record Number: [id number] Patient Account Number: [id number] Date of Birth/Sex: Treating RN: [birth year] (72 y.o. [name] Primary Care Andrian Urbach: Jayme Cloud., [name] Other Clinician: Referring Navarre Diana: Treating Hollie Bartus/Extender: Allena Katz., [name] in Treatment: 21 Encounter Discharge Information Items Discharge Condition: Stable Ambulatory Status: Ambulatory Discharge Destination: Skilled Nursing Facility Telephoned: No Orders Sent: Yes Transportation: Other Accompanied By: self Schedule Follow-up Appointment: Yes Clinical Summary of Care: Patient Declined Electronic Signature(s) Signed: 06/27/2022 1:55:51 PM By: Brenton Grills Entered By: Brenton Grills on 05/31/2022 08:52:15 -------------------------------------------------------------------------------- Patient/Caregiver Education Details Patient Name: Date of Service: [name] 3/22/2024andnbsp9:00 A M Medical Record Number: [id number] Patient Account Number: [id number] Date of Birth/Gender: Treating RN: [birth year] (72 y.o. [name] Primary Care Physician: Jayme Cloud., [name] Other Clinician: Referring Physician: Treating Physician/Extender: Allena Katz., [name] in Treatment: 21 Education Assessment Education Provided To: Patient Education Topics Provided Wound/Skin Impairment: Handouts: Caring for Your Ulcer Methods: Explain/Verbal Responses: Reinforcements needed, State content correctly Electronic Signature(s) Signed: 06/27/2022 1:55:51 PM By: Brenton Grills Entered By: Brenton Grills on 05/31/2022 08:51:44 -------------------------------------------------------------------------------- Wound Assessment Details Patient Name: Date of Service: [name]. 05/31/2022 9:00 A M Medical Record Number: [id number] Patient Account Number: [id number] Date of Birth/Sex: Treating RN: [birth year] (72 y.o. [name] Primary Care Deago Burruss: Jayme Cloud., [name] Other Clinician: Referring Legacy Lacivita: Treating Shamaya Kauer/Extender: Crissie Figures in Treatment: 21 Wound Status Wound Number: 1 Primary Etiology: Pressure Ulcer [name], [name] ([id number]) [phone] Page 4 of 4 Wound Location: Sacrum Wound Status: Open Wounding Event: Pressure Injury Comorbid History: Colitis Date Acquired: 10/09/2021 Weeks Of Treatment: 21 Clustered Wound: No Wound Measurements Length: (cm) 0.4 Width: (cm) 0.4 Depth: (cm) 1.2 Area: (cm) 0.126 Volume: (cm) 0.151 % Reduction in Area: 64.3% % Reduction in Volume: 38.9% Epithelialization:  Small (1-33%) Tunneling: No Undermining: No Wound Description Classification: Category/Stage IV Wound Margin: Distinct, outline attached Exudate Amount: Medium Exudate Type: Serosanguineous Exudate Color: red, brown Foul Odor After Cleansing: Yes Due to Product Use: No Slough/Fibrino Yes Wound Bed Granulation Amount: Large (67-100%) Exposed Structure Granulation Quality: Red, Pink Fascia Exposed: No Necrotic Amount: Small (1-33%) Fat Layer (Subcutaneous Tissue) Exposed: Yes Necrotic Quality: Adherent Slough Tendon Exposed: No Muscle Exposed: No Joint Exposed: No Bone Exposed: No Periwound Skin Texture Texture Color No Abnormalities Noted: No No Abnormalities Noted: No Callus: No Atrophie Blanche: No Crepitus: No Cyanosis: No Excoriation: No Ecchymosis: No Induration: No Erythema: No Rash: No Hemosiderin Staining: No Scarring: No Mottled: No Pallor: No Moisture Rubor: No No Abnormalities Noted: No Dry / Scaly: No Temperature / Pain Maceration: No Temperature: No Abnormality Tenderness on Palpation: Yes Electronic Signature(s) Signed: 06/27/2022 1:55:51 PM By: Brenton Grills Entered By: Brenton Grills on 05/31/2022  08:36:47 -------------------------------------------------------------------------------- Vitals Details Patient Name: Date of Service: [name]. 05/31/2022 9:00 A M Medical Record Number: [id number] Patient Account Number: [id number] Date of Birth/Sex: Treating RN: Jul 14, 1950 (72 y.o. [name] Primary Care Adaia Matthies: Jayme Cloud., [name] Other Clinician: Referring Osaze Hubbert: Treating Sanah Kraska/Extender: Allena Katz., [name] in Treatment: 21 Vital Signs Time Taken: 08:30 Temperature (F): 98.2 Height (in): 64 Pulse (bpm): 81 Weight (lbs): 97 Respiratory Rate (breaths/min): 17 Body Mass Index (BMI): 16.6 Blood Pressure (mmHg): 95/51 Reference Range: 80 - 120 mg / dl Electronic Signature(s) Signed: 06/27/2022 1:55:51 PM By: Brenton Grills Entered By: Brenton Grills on 05/31/2022 08:30:21

## 2022-06-27 NOTE — Progress Notes (Signed)
BEAUTY, PLESS R ([id number]) [id number] Page 1 of 1 Visit Report for 06/21/2022 SuperBill Details Patient Name: Date of Service: [name] 06/21/2022 Medical Record Number: [id number] Patient Account Number: [id number] Date of Birth/Sex: Treating RN: November 09, 1950 (72 y.o. [name] Primary Care Provider: Jayme Cloud., [name] Other Clinician: Referring Provider: Treating Provider/Extender: Allena Katz., [name] in Treatment: 24 Diagnosis Coding ICD-10 Codes Code Description [phone] Pressure ulcer of sacral region, stage 4 M62.81 Muscle weakness (generalized) E43 Unspecified severe protein-calorie malnutrition Facility Procedures CPT4 Code Description Modifier Quantity [id number] [phone] - WOUND VAC-50 SQ CM OR LESS 1 ICD-10 Diagnosis Description L89.154 Pressure ulcer of sacral region, stage 4 Physician Procedures Quantity CPT4 Code Description Modifier [id number] - WC PHYS TX WOUND VAC < 50 SQ CM 1 ICD-10 Diagnosis Description L89.154 Pressure ulcer of sacral region, stage 4 Electronic Signature(s) Signed: 06/21/2022 12:30:48 PM By: Geralyn Corwin DO Signed: 06/27/2022 1:56:54 PM By: Brenton Grills Entered By: Brenton Grills on 06/21/2022 11:38:14

## 2022-06-27 NOTE — Progress Notes (Signed)
AMORE, ACKMAN ([id number]) [id number] Page 1 of 3 Visit Report for 06/21/2022 Arrival Information Details Patient Name: Date of Service: [name] 06/21/2022 9:45 A M Medical Record Number: [id number] Patient Account Number: [id number] Date of Birth/Sex: Treating RN: 02/17/1951 (72 y.o. [name] Primary Care Chellie Vanlue: Jayme Cloud., [name] Other Clinician: Referring Mykel Mohl: Treating Neville Pauls/Extender: Allena Katz., [name] in Treatment: 24 Visit Information History Since Last Visit Added or deleted any medications: No Patient Arrived: Ambulatory Any new allergies or adverse reactions: No Arrival Time: 10:02 Had a fall or experienced change in No Accompanied By: self activities of daily living that may affect Transfer Assistance: None risk of falls: Patient Identification Verified: Yes Signs or symptoms of abuse/neglect since last visito No Secondary Verification Process Completed: Yes Hospitalized since last visit: No Patient Requires Transmission-Based Precautions: No Implantable device outside of the clinic excluding No Patient Has Alerts: No cellular tissue based products placed in the center since last visit: Has Dressing in Place as Prescribed: Yes Pain Present Now: Yes Electronic Signature(s) Signed: 06/27/2022 1:56:54 PM By: Brenton Grills Entered By: Brenton Grills on 06/21/2022 11:36:47 -------------------------------------------------------------------------------- Encounter Discharge Information Details Patient Name: Date of Service: Marissa Berthold Cardenas. 06/21/2022 9:45 A M Medical Record Number: [id number] Patient Account Number: [id number] Date of Birth/Sex: Treating RN: Jun 24, 1950 (72 y.o. [name] Primary Care Veona Bittman: Jayme Cloud., [name] Other Clinician: Referring Morgana Rowley: Treating Nalini Alcaraz/Extender: Allena Katz., [name] in Treatment: 24 Encounter Discharge  Information Items Discharge Condition: Stable Ambulatory Status: Ambulatory Discharge Destination: Skilled Nursing Facility Telephoned: No Orders Sent: Yes Transportation: Other Accompanied By: self Schedule Follow-up Appointment: Yes Clinical Summary of Care: Patient Declined Electronic Signature(s) Signed: 06/27/2022 1:56:54 PM By: Brenton Grills Entered By: Brenton Grills on 06/21/2022 11:37:58 -------------------------------------------------------------------------------- Negative Pressure Wound Therapy Maintenance (NPWT) Details Patient Name: Date of Service: [name] 06/21/2022 9:45 A M Medical Record Number: [id number] Patient Account Number: [id number] Date of Birth/Sex: Treating RN: [birth year] (72 y.o. [name] Primary Care Lacara Dunsworth: Jayme Cloud., [name] Other Clinician: Referring Rhaya Coale: Treating Toniyah Dilmore/Extender: Allena Katz., [name] in Treatment: 24 NPWT Maintenance Performed for: Wound #1 Sacrum Performed By: Brenton Grills, RN [name] ([id number]) [phone].pdf Page 2 of 3 Type: VAC System Coverage Size (sq cm): 0.06 Pressure Type: Constant Pressure Setting: 125 mmHG Drain Type: None Primary Contact: Non-Adherent Sponge/Dressing Type: Foam- Black Date Initiated: 06/05/2022 Dressing Removed: Yes Quantity of Sponges/Gauze Removed: 1 Canister Changed: Yes Canister Exudate Volume: 25 Dressing Reapplied: Yes Quantity of Sponges/Gauze Inserted: 3 Respones T Treatment: o tolerated well Days On NPWT : 17 Electronic Signature(s) Signed: 06/27/2022 1:56:54 PM By: Brenton Grills Entered By: Brenton Grills on 06/21/2022 10:23:16 -------------------------------------------------------------------------------- Patient/Caregiver Education Details Patient Name: Date of Service: [name] 4/12/2024andnbsp9:45 A M Medical Record Number: [id number] Patient Account Number: [id number] Date  of Birth/Gender: Treating RN: 26-Sep-1950 (72 y.o. [name] Primary Care Physician: Jayme Cloud., [name] Other Clinician: Referring Physician: Treating Physician/Extender: Allena Katz., [name] in Treatment: 24 Education Assessment Education Provided To: Patient Education Topics Provided Wound/Skin Impairment: Methods: Explain/Verbal Responses: State content correctly [name]) Signed: 06/27/2022 1:56:54 PM By: Brenton Grills Entered By: Brenton Grills on 06/21/2022 11:37:49 -------------------------------------------------------------------------------- Wound Assessment Details Patient Name: Date of Service: Marissa Deliah Goody Cardenas. 06/21/2022 9:45 A M Medical Record Number: [id number] Patient Account Number: [id number] Date of Birth/Sex: Treating RN: [birth year] (72 y.o. F) Primary Care Katoria Yetman: Jayme Cloud.,  [name] Other Clinician: Referring Kaysey Berndt: Treating Jonluke Cobbins/Extender: Crissie Figures in Treatment: 24 Wound Status Wound Number: 1 Primary Etiology: Pressure Ulcer Wound Location: Sacrum Wound Status: Open Wounding Event: Pressure Injury Date Acquired: 10/09/2021 Weeks Of Treatment: 24 Clustered Wound: No Wound Measurements Length: (cm) 0.3 Width: (cm) 0.2 Depth: (cm) 1 [name], [name] ([id number]) Area: (cm) 0.047 Volume: (cm) 0.047 % Reduction in Area: 86.7% % Reduction in Volume: 81% [id number] Page 3 of 3 Wound Description Classification: Category/Stage IV Exudate Amount: Medium Exudate Type: Serosanguineous Exudate Color: red, brown Periwound Skin Texture Texture Color No Abnormalities Noted: No No Abnormalities Noted: No Moisture No Abnormalities Noted: No Electronic Signature(s) Signed: 06/26/2022 9:53:02 AM By: Karl Ito Entered By: Karl Ito on 06/21/2022  10:03:39 -------------------------------------------------------------------------------- Vitals Details Patient Name: Date of Service: Marissa Berthold Cardenas. 06/21/2022 9:45 A M Medical Record Number: [id number] Patient Account Number: [id number] Date of Birth/Sex: Treating RN: [birth year] (72 y.o. [name] Primary Care Masiah Lewing: Jayme Cloud., [name] Other Clinician: Referring Zae Kirtz: Treating Keyonte Cookston/Extender: Allena Katz., [name] in Treatment: 24 Vital Signs Time Taken: 10:03 Temperature (F): 98.1 Height (in): 64 Pulse (bpm): 70 Weight (lbs): 97 Respiratory Rate (breaths/min): 18 Body Mass Index (BMI): 16.6 Blood Pressure (mmHg): 120/80 Reference Range: 80 - 120 mg / dl Electronic Signature(s) Signed: 06/27/2022 1:56:54 PM By: Brenton Grills Entered By: Brenton Grills on 06/21/2022 11:37:01

## 2022-06-27 NOTE — Progress Notes (Signed)
[name], [name] ([id number]) [id number] Page 1 of 3 Visit Report for 05/27/2022 Arrival Information Details Patient Name: Date of Service: [name]. 05/27/2022 8:00 A M Medical Record Number: [id number] Patient Account Number: [id number] Date of Birth/Sex: Treating RN: [birth year] (72 y.o. [name] Primary Care Keeley Sussman: Jayme Cloud., [name] Other Clinician: Referring Virgin Zellers: Treating Velna Hedgecock/Extender: Allena Katz., [name] in Treatment: 20 Visit Information History Since Last Visit All ordered tests and consults were completed: Yes Patient Arrived: Ambulatory Added or deleted any medications: No Arrival Time: 08:12 Any new allergies or adverse reactions: No Accompanied By: spouse Had a fall or experienced change in No Transfer Assistance: None activities of daily living that may affect Patient Identification Verified: Yes risk of falls: Secondary Verification Process Completed: Yes Signs or symptoms of abuse/neglect since last visito No Patient Requires Transmission-Based Precautions: No Hospitalized since last visit: No Patient Has Alerts: No Implantable device outside of the clinic excluding No cellular tissue based products placed in the center since last visit: Pain Present Now: Yes Electronic Signature(s) Signed: 06/27/2022 1:55:51 PM By: Brenton Grills Entered By: Brenton Grills on 05/27/2022 08:19:32 -------------------------------------------------------------------------------- Clinic Level of Care Assessment Details Patient Name: Date of Service: [name]. 05/27/2022 8:00 A M Medical Record Number: [id number] Patient Account Number: [id number] Date of Birth/Sex: Treating RN: [birth year] (72 y.o. [name], [name] Primary Care Harrison Paulson: Jayme Cloud., [name] Other Clinician: Referring Tyeasha Ebbs: Treating Zanyia Silbaugh/Extender: Allena Katz., [name] in Treatment: 20 Clinic Level  of Care Assessment Items TOOL 4 Quantity Score X- 1 0 Use when only an EandM is performed on FOLLOW-UP visit ASSESSMENTS - Nursing Assessment / Reassessment X- 1 10 Reassessment of Marissa-morbidities (includes updates in patient status) X- 1 5 Reassessment of Adherence to Treatment Plan ASSESSMENTS - Wound and Skin A ssessment / Reassessment X - Simple Wound Assessment / Reassessment - one wound 1 5  - 0 Complex Wound Assessment / Reassessment - multiple wounds  - 0 Dermatologic / Skin Assessment (not related to wound area) ASSESSMENTS - Focused Assessment  - 0 Circumferential Edema Measurements - multi extremities  - 0 Nutritional Assessment / Counseling / Intervention  - 0 Lower Extremity Assessment (monofilament, tuning fork, pulses)  - 0 Peripheral Arterial Disease Assessment (using hand held doppler) ASSESSMENTS - Ostomy and/or Continence Assessment and Care  - 0 Incontinence Assessment and Management  - 0 Ostomy Care Assessment and Management (repouching, etc.) PROCESS - Coordination of Care X - Simple Patient / Family Education for ongoing care 1 15 [name], [name] ([id number]) [phone].pdf Page 2 of 3  - 0 Complex (extensive) Patient / Family Education for ongoing care X- 1 10 Staff obtains Chiropractor, Records, T Results / Process Orders est  - 0 Staff telephones HHA, Nursing Homes / Clarify orders / etc  - 0 Routine Transfer to another Facility (non-emergent condition)  - 0 Routine Hospital Admission (non-emergent condition)  - 0 New Admissions / Manufacturing engineer / Ordering NPWT Apligraf, etc. ,  - 0 Emergency Hospital Admission (emergent condition) X- 1 10 Simple Discharge Coordination  - 0 Complex (extensive) Discharge Coordination PROCESS - Special Needs  - 0 Pediatric / Minor Patient Management  - 0 Isolation Patient Management  - 0 Hearing / Language / Visual special needs  -  0 Assessment of Community assistance (transportation, D/C planning, etc.)  - 0 Additional assistance / Altered mentation  - 0 Support Surface(s) Assessment (bed, cushion, seat, etc.) INTERVENTIONS - Wound Cleansing /  Measurement X - Simple Wound Cleansing - one wound 1 5  - 0 Complex Wound Cleansing - multiple wounds  - 0 Wound Imaging (photographs - any number of wounds)  - 0 Wound Tracing (instead of photographs)  - 0 Simple Wound Measurement - one wound  - 0 Complex Wound Measurement - multiple wounds INTERVENTIONS - Wound Dressings X - Small Wound Dressing one or multiple wounds 1 10  - 0 Medium Wound Dressing one or multiple wounds  - 0 Large Wound Dressing one or multiple wounds  - 0 Application of Medications - topical  - 0 Application of Medications - injection INTERVENTIONS - Miscellaneous  - 0 External ear exam  - 0 Specimen Collection (cultures, biopsies, blood, body fluids, etc.)  - 0 Specimen(s) / Culture(s) sent or taken to Lab for analysis  - 0 Patient Transfer (multiple staff / Nurse, adult / Similar devices)  - 0 Simple Staple / Suture removal (25 or less)  - 0 Complex Staple / Suture removal (26 or more)  - 0 Hypo / Hyperglycemic Management (close monitor of Blood Glucose)  - 0 Ankle / Brachial Index (ABI) - do not check if billed separately X- 1 5 Vital Signs Has the patient been seen at the hospital within the last three years: Yes Total Score: 75 Level Of Care: New/Established - Level 2 Electronic Signature(s) Signed: 05/27/2022 5:41:46 PM By: Shawn Stall RN, BSN Entered By: Shawn Stall on 05/27/2022 08:19:21 [name] ([id number]) [id number] Page 3 of 3 -------------------------------------------------------------------------------- Encounter Discharge Information Details Patient Name: Date of Service: Marissa [name]. 05/27/2022 8:00 A M Medical Record Number:  [id number] Patient Account Number: [id number] Date of Birth/Sex: Treating RN: 27-May-1950 (72 y.o. [name], [name] Primary Care Aleksi Brummet: Jayme Cloud., [name] Other Clinician: Referring Ernie Kasler: Treating Myesha Stillion/Extender: Allena Katz., [name] in Treatment: 20 Encounter Discharge Information Items Discharge Condition: Stable Ambulatory Status: Ambulatory Discharge Destination: Home Transportation: Private Auto Accompanied By: self Schedule Follow-up Appointment: Yes Clinical Summary of Care: Electronic Signature(s) Signed: 05/27/2022 5:41:46 PM By: Shawn Stall RN, BSN Entered By: Shawn Stall on 05/27/2022 08:18:47 -------------------------------------------------------------------------------- Wound Assessment Details Patient Name: Date of Service: [name]. 05/27/2022 8:00 A M Medical Record Number: [id number] Patient Account Number: [id number] Date of Birth/Sex: Treating RN: 1950/12/06 (72 y.o. [name], [name] Primary Care Jeimy Bickert: Jayme Cloud., [name] Other Clinician: Referring Yatzil Clippinger: Treating Ester Mabe/Extender: Allena Katz., [name] in Treatment: 20 Wound Status Wound Number: 1 Primary Etiology: Pressure Ulcer Wound Location: Sacrum Wound Status: Open Wounding Event: Pressure Injury Date Acquired: 10/09/2021 Weeks Of Treatment: 20 Clustered Wound: No Wound Measurements Length: (cm) 0.4 Width: (cm) 0.4 Depth: (cm) 1 Area: (cm) 0.126 Volume: (cm) 0.126 % Reduction in Area: 64.3% % Reduction in Volume: 49% Wound Description Classification: Category/Stage IV Exudate Amount: Medium Exudate Type: Purulent Exudate Color: yellow, brown, green Periwound Skin Texture Texture Color No Abnormalities Noted: No No Abnormalities Noted: No Moisture No Abnormalities Noted: No Electronic Signature(s) Signed: 05/27/2022 5:41:46 PM By: Shawn Stall RN, BSN Entered By: Shawn Stall on 05/27/2022 08:17:13

## 2022-06-28 ENCOUNTER — Encounter (HOSPITAL_BASED_OUTPATIENT_CLINIC_OR_DEPARTMENT_OTHER): Payer: 59 | Admitting: Internal Medicine

## 2022-06-28 DIAGNOSIS — K838 Other specified diseases of biliary tract: Secondary | ICD-10-CM | POA: Diagnosis not present

## 2022-06-28 DIAGNOSIS — E43 Unspecified severe protein-calorie malnutrition: Secondary | ICD-10-CM | POA: Diagnosis not present

## 2022-06-28 DIAGNOSIS — M6281 Muscle weakness (generalized): Secondary | ICD-10-CM | POA: Diagnosis not present

## 2022-06-28 DIAGNOSIS — Z681 Body mass index (BMI) 19 or less, adult: Secondary | ICD-10-CM | POA: Diagnosis not present

## 2022-06-28 DIAGNOSIS — L89153 Pressure ulcer of sacral region, stage 3: Secondary | ICD-10-CM | POA: Diagnosis not present

## 2022-06-28 DIAGNOSIS — I700 Atherosclerosis of aorta: Secondary | ICD-10-CM | POA: Diagnosis not present

## 2022-06-28 DIAGNOSIS — Z9049 Acquired absence of other specified parts of digestive tract: Secondary | ICD-10-CM | POA: Diagnosis not present

## 2022-06-28 DIAGNOSIS — K862 Cyst of pancreas: Secondary | ICD-10-CM | POA: Diagnosis not present

## 2022-06-28 DIAGNOSIS — L89154 Pressure ulcer of sacral region, stage 4: Secondary | ICD-10-CM | POA: Diagnosis not present

## 2022-06-28 NOTE — Progress Notes (Signed)
EVADEAN, SPROULE ([id number]) [id number] Page 1 of 1 Visit Report for 06/28/2022 SuperBill Details Patient Name: Date of Service: [name] 06/28/2022 Medical Record Number: [id number] Patient Account Number: [id number] Date of Birth/Sex: Treating RN: 01/29/1951 (72 y.o. F) Primary Care Provider: Jayme Cloud., [name] Other Clinician: Referring Provider: Treating Provider/Extender: Crissie Figures in Treatment: 25 Diagnosis Coding ICD-10 Codes Code Description [phone] Pressure ulcer of sacral region, stage 4 M62.81 Muscle weakness (generalized) E43 Unspecified severe protein-calorie malnutrition Facility Procedures CPT4 Code Description Modifier Quantity [id number] - WOUND VAC-50 SQ CM OR LESS 1 Electronic Signature(s) Signed: 06/28/2022 11:38:03 AM By: Thayer Dallas Signed: 06/28/2022 12:25:56 PM By: Geralyn Corwin DO Entered By: Thayer Dallas on 06/28/2022 11:37:23

## 2022-06-28 NOTE — Progress Notes (Signed)
Marissa, Cardenas ([id number]) [id number] Page 1 of 3 Visit Report for 06/28/2022 Arrival Information Details Patient Name: Date of Service: [name]. 06/28/2022 11:15 A M Medical Record Number: [id number] Patient Account Number: [id number] Date of Birth/Sex: Treating RN: [birth year] (72 y.o. F) Primary Care Zandra Lajeunesse: Jayme Cloud., [name] Other Clinician: Referring Labria Wos: Treating Armany Mano/Extender: Crissie Figures in Treatment: 25 Visit Information History Since Last Visit Added or deleted any medications: No Patient Arrived: Ambulatory Any new allergies or adverse reactions: No Arrival Time: 10:47 Had a fall or experienced change in No Accompanied By: self activities of daily living that may affect Transfer Assistance: None risk of falls: Patient Identification Verified: Yes Signs or symptoms of abuse/neglect since last visito No Secondary Verification Process Completed: Yes Hospitalized since last visit: No Patient Requires Transmission-Based Precautions: No Implantable device outside of the clinic excluding No Patient Has Alerts: No cellular tissue based products placed in the center since last visit: Has Dressing in Place as Prescribed: Yes Pain Present Now: No Electronic Signature(s) Signed: 06/28/2022 11:38:03 AM By: Thayer Dallas Entered By: Thayer Dallas on 06/28/2022 10:47:38 -------------------------------------------------------------------------------- Encounter Discharge Information Details Patient Name: Date of Service: Marissa Berthold R. 06/28/2022 11:15 A M Medical Record Number: [id number] Patient Account Number: [id number] Date of Birth/Sex: Treating RN: 1950/12/25 (72 y.o. F) Primary Care Zamir Staples: Jayme Cloud., Mort Sawyers Clinician: Thayer Dallas Referring Geza Beranek: Treating Vicke Plotner/Extender: Allena Katz., [name] in Treatment: 25 Encounter Discharge Information  Items Discharge Condition: Stable Ambulatory Status: Ambulatory Discharge Destination: Home Transportation: Other Accompanied By: self Schedule Follow-up Appointment: Yes Clinical Summary of Care: Electronic Signature(s) Signed: 06/28/2022 11:38:03 AM By: Thayer Dallas Entered By: Thayer Dallas on 06/28/2022 11:33:48 -------------------------------------------------------------------------------- Negative Pressure Wound Therapy Maintenance (NPWT) Details Patient Name: Date of Service: [name] 06/28/2022 11:15 A M Medical Record Number: [id number] Patient Account Number: [id number] Date of Birth/Sex: Treating RN: [birth year] (72 y.o. F) Primary Care Naren Benally: Jayme Cloud., [name] Other Clinician: Referring Shaleah Nissley: Treating Dvante Hands/Extender: Crissie Figures in Treatment: 25 NPWT Maintenance Performed for: Wound #1 Sacrum Performed By: Thayer Dallas, Type: Other Coverage Size (sq cm): 0.08 Marissa, CARMICKLE R ([id number]) [id number] Page 2 of 3 Pressure Type: Constant Pressure Setting: 125 mmHG Drain Type: None Primary Contact: Non-Adherent Sponge/Dressing Type: Foam- Brown,Gauze Date Initiated: 06/05/2022 Date Suspended: 06/26/2022 Date Resumed: 06/28/2022 Dressing Removed: Yes Quantity of Sponges/Gauze Removed: na Canister Changed: Yes Canister Exudate Volume: 0 Dressing Reapplied: Yes Quantity of Sponges/Gauze Inserted: 1 gauze, black foam Respones T Treatment: o tolerated well Days On NPWT : 23 Electronic Signature(s) Signed: 06/28/2022 11:38:03 AM By: Thayer Dallas Entered By: Thayer Dallas on 06/28/2022 11:37:11 -------------------------------------------------------------------------------- Patient/Caregiver Education Details Patient Name: Date of Service: [name] 4/19/2024andnbsp11:15 A M Medical Record Number: [id number] Patient Account Number: [id number] Date of Birth/Gender: Treating  RN: [birth year] (72 y.o. F) Primary Care Physician: Jayme Cloud., [name] Other Clinician: Thayer Dallas Referring Physician: Treating Physician/Extender: Allena Katz., [name] in Treatment: 25 Education Assessment Education Provided To: Patient Education Topics Provided Electronic Signature(s) Signed: 06/28/2022 11:38:03 AM By: Thayer Dallas Entered By: Thayer Dallas on 06/28/2022 11:33:31 -------------------------------------------------------------------------------- Wound Assessment Details Patient Name: Date of Service: [place] Marissa Goody R. 06/28/2022 11:15 A M Medical Record Number: [id number] Patient Account Number: [id number] Date of Birth/Sex: Treating RN: [birth year] (72 y.o. F) Primary Care Lener Ventresca: Jayme Cloud., [name] Other Clinician: Referring Fenris Cauble: Treating Eljay Lave/Extender: Allena Katz., [name] in  Treatment: 25 Wound Status Wound Number: 1 Primary Etiology: Pressure Ulcer Wound Location: Sacrum Wound Status: Open Wounding Event: Pressure Injury Date Acquired: 10/09/2021 Weeks Of Treatment: 25 Clustered Wound: No Wound Measurements Length: (cm) 0.4 Width: (cm) 0.2 Depth: (cm) 1 Area: (cm) 0.063 Volume: (cm) 0.063 % Reduction in Area: 82.2% % Reduction in Volume: 74.5% Wound Description AARIAN, CLEAVER R ([id number]) Classification: Category/Stage IV Exudate Amount: Medium Exudate Type: Serosanguineous Exudate Color: red, brown [id number] Page 3 of 3 Periwound Skin Texture Texture Color No Abnormalities Noted: No No Abnormalities Noted: No Moisture No Abnormalities Noted: No Treatment Notes Wound #1 (Sacrum) Cleanser Wound Cleanser Discharge Instruction: Cleanse the wound with wound cleanser prior to applying a clean dressing using gauze sponges, not tissue or cotton balls. Peri-Wound Care Skin Prep Discharge Instruction: Use skin prep as  directed Topical Gentamicin Discharge Instruction: As directed by physician Primary Dressing Hydrofera Blue Classic Foam Rope Dressing, 9x6 (mm/in) Discharge Instruction: Moisten with vashe and pack into wound. Reapply wound vac Friday. Secondary Dressing Secured With Compression Wrap Compression Stockings Add-Ons Electronic Signature(s) Signed: 06/28/2022 11:38:03 AM By: Thayer Dallas Entered By: Thayer Dallas on 06/28/2022 10:47:53 -------------------------------------------------------------------------------- Vitals Details Patient Name: Date of Service: Marissa Berthold R. 06/28/2022 11:15 A M Medical Record Number: [id number] Patient Account Number: [id number] Date of Birth/Sex: Treating RN: 11/16/1950 (72 y.o. F) Primary Care Alessandra Sawdey: Jayme Cloud., [name] Other Clinician: Referring Raela Bohl: Treating Jenine Krisher/Extender: Crissie Figures in Treatment: 25 Vital Signs Time Taken: 10:47 Reference Range: 80 - 120 mg / dl Height (in): 64 Weight (lbs): 97 Body Mass Index (BMI): 16.6 Electronic Signature(s) Signed: 06/28/2022 11:38:03 AM By: Thayer Dallas Entered By: Thayer Dallas on 06/28/2022 10:47:44

## 2022-06-28 NOTE — Progress Notes (Signed)
[name] ([id number]) [id number] Page 1 of 6 Visit Report for 06/26/2022 Arrival Information Details Patient Name: Date of Service: [name] 06/26/2022 1:15 PM Medical Record Number: [id number] Patient Account Number: [id number] Date of Birth/Sex: Treating RN: May 13, 1950 (72 y.o. F) Primary Care [name]: [name] Other Clinician: Referring [name]: Treating [name] in Treatment: 25 Visit Information History Since Last Visit Added or deleted any medications: No Patient Arrived: Ambulatory Any new allergies or adverse reactions: No Arrival Time: 13:42 Had a fall or experienced change in No Accompanied By: self activities of daily living that may affect Transfer Assistance: None risk of falls: Patient Identification Verified: Yes Signs or symptoms of abuse/neglect since last visito No Secondary Verification Process Completed: Yes Hospitalized since last visit: No Patient Requires Transmission-Based Precautions: No Implantable device outside of the clinic excluding No Patient Has Alerts: No cellular tissue based products placed in the center since last visit: Has Dressing in Place as Prescribed: Yes Pain Present Now: No Electronic Signature(s) Signed: 06/28/2022 11:38:03 AM By: Thayer Dallas Entered By: Thayer Dallas on 06/26/2022 13:42:34 -------------------------------------------------------------------------------- Clinic Level of Care Assessment Details Patient Name: Date of Service: [name] 06/26/2022 1:15 PM Medical Record Number: [id number] Patient Account Number: [id number] Date of Birth/Sex: Treating RN: 26-Sep-1950 (72 y.o. [name] Primary Care Braylon Lemmons: [name] Other Clinician: Referring Soleil Mas: Treating Rosanne Wohlfarth/Extender: Crista Cardenas in Treatment: 25 Clinic Level of Care Assessment  Items TOOL 4 Quantity Score X- 1 0 Use when only an EandM is performed on FOLLOW-UP visit ASSESSMENTS - Nursing Assessment / Reassessment X- 1 10 Reassessment of Co-morbidities (includes updates in patient status) X- 1 5 Reassessment of Adherence to Treatment Plan ASSESSMENTS - Wound and Skin A ssessment / Reassessment X - Simple Wound Assessment / Reassessment - one wound 1 5  - 0 Complex Wound Assessment / Reassessment - multiple wounds  - 0 Dermatologic / Skin Assessment (not related to wound area) ASSESSMENTS - Focused Assessment  - 0 Circumferential Edema Measurements - multi extremities  - 0 Nutritional Assessment / Counseling / Intervention  - 0 Lower Extremity Assessment (monofilament, tuning fork, pulses)  - 0 Peripheral Arterial Disease Assessment (using hand held doppler) ASSESSMENTS - Ostomy and/or Continence Assessment and Care  - 0 Incontinence Assessment and Management  - 0 Ostomy Care Assessment and Management (repouching, etc.) PROCESS - Coordination of Care X - Simple Patient / Family Education for ongoing care 1 15 [name] ([id number]) [id number] Page 2 of 6  - 0 Complex (extensive) Patient / Family Education for ongoing care X- 1 10 Staff obtains Chiropractor, Records, T Results / Process Orders est  - 0 Staff telephones HHA, Nursing Homes / Clarify orders / etc  - 0 Routine Transfer to another Facility (non-emergent condition)  - 0 Routine Hospital Admission (non-emergent condition)  - 0 New Admissions / Manufacturing engineer / Ordering NPWT Apligraf, etc. ,  - 0 Emergency Hospital Admission (emergent condition) X- 1 10 Simple Discharge Coordination  - 0 Complex (extensive) Discharge Coordination PROCESS - Special Needs  - 0 Pediatric / Minor Patient Management  - 0 Isolation Patient Management  - 0 Hearing / Language / Visual special needs  - 0 Assessment of  Community assistance (transportation, D/C planning, etc.)  - 0 Additional assistance / Altered mentation  - 0 Support Surface(s) Assessment (bed, cushion, seat, etc.) INTERVENTIONS - Wound Cleansing / Measurement X -  Simple Wound Cleansing - one wound 1 5 []  - 0 Complex Wound Cleansing - multiple wounds X- 1 5 Wound Imaging (photographs - any number of wounds) []  - 0 Wound Tracing (instead of photographs) X- 1 5 Simple Wound Measurement - one wound []  - 0 Complex Wound Measurement - multiple wounds INTERVENTIONS - Wound Dressings X - Small Wound Dressing one or multiple wounds 1 10 []  - 0 Medium Wound Dressing one or multiple wounds []  - 0 Large Wound Dressing one or multiple wounds X- 1 5 Application of Medications - topical []  - 0 Application of Medications - injection INTERVENTIONS - Miscellaneous []  - 0 External ear exam []  - 0 Specimen Collection (cultures, biopsies, blood, body fluids, etc.) []  - 0 Specimen(s) / Culture(s) sent or taken to Lab for analysis []  - 0 Patient Transfer (multiple staff / Nurse, adult / Similar devices) []  - 0 Simple Staple / Suture removal (25 or less) []  - 0 Complex Staple / Suture removal (26 or more) []  - 0 Hypo / Hyperglycemic Management (close monitor of Blood Glucose) []  - 0 Ankle / Brachial Index (ABI) - do not check if billed separately X- 1 5 Vital Signs Has the patient been seen at the hospital within the last three years: Yes Total Score: 90 Level Of Care: New/Established - Level 3 Electronic Signature(s) Signed: 07/02/2022 3:46:00 PM By: Fonnie Mu RN Entered By: Fonnie Mu on 07/02/2022 09:45:29 [name] ([id number]) [id number] Page 3 of 6 -------------------------------------------------------------------------------- Encounter Discharge Information Details Patient Name: Date of Service: [name] 06/26/2022 1:15 PM Medical Record Number:  [id number] Patient Account Number: [id number] Date of Birth/Sex: Treating RN: 11-21-1950 (72 y.o. [name] Primary Care Jaan Fischel: [name] Other Clinician: Referring Rexine Gowens: Treating Crystale Giannattasio/Extender: Crista Cardenas in Treatment: 25 Encounter Discharge Information Items Discharge Condition: Stable Ambulatory Status: Ambulatory Discharge Destination: Home Transportation: Private Auto Accompanied By: self Schedule Follow-up Appointment: Yes Clinical Summary of Care: Patient Declined Electronic Signature(s) Signed: 07/02/2022 3:46:00 PM By: Fonnie Mu RN Entered By: Fonnie Mu on 07/02/2022 09:46:01 -------------------------------------------------------------------------------- Lower Extremity Assessment Details Patient Name: Date of Service: CO Deliah Goody Cardenas. 06/26/2022 1:15 PM Medical Record Number: [id number] Patient Account Number: [id number] Date of Birth/Sex: Treating RN: November 18, 1950 (72 y.o. F) Primary Care Catherine Cubero: [name] Other Clinician: Referring Yavier Snider: Treating Khamani Fairley/Extender: Crista Cardenas in Treatment: 25 Electronic Signature(s) Signed: 06/28/2022 11:38:03 AM By: Thayer Dallas Entered By: Thayer Dallas on 06/26/2022 13:44:18 -------------------------------------------------------------------------------- Multi-Disciplinary Care Plan Details Patient Name: Date of Service: Filbert Berthold Cardenas. 06/26/2022 1:15 PM Medical Record Number: [id number] Patient Account Number: [id number] Date of Birth/Sex: Treating RN: 1950-08-27 (72 y.o. [name] Primary Care Vence Lalor: [name] Other Clinician: Referring Chukwuma Straus: Treating Tanny Harnack/Extender: Crista Cardenas in Treatment: 25 Active Inactive Pain, Acute or Chronic Nursing Diagnoses: Pain, acute or chronic: actual or potential Potential alteration in comfort,  pain Goals: Patient will verbalize adequate pain control and receive pain control interventions during procedures as needed Date Initiated: 01/02/2022 Target Resolution Date: 07/13/2022 Goal Status: Active Patient/caregiver will verbalize adequate pain control between visits Date Initiated: 01/02/2022 Target Resolution Date: 07/13/2022 Goal Status: Active Interventions: Encourage patient to take pain medications as prescribed CRISTABEL, BICKNELL Cardenas ([id number]) [id number] Page 4 of 6 Provide education on pain management Reposition patient for comfort Treatment Activities: Administer pain control measures as ordered : 01/02/2022 Notes: Pressure Nursing Diagnoses: Knowledge  deficit related to management of pressures ulcers Goals: Patient/caregiver will verbalize risk factors for pressure ulcer development Date Initiated: 01/02/2022 Target Resolution Date: 07/13/2022 Goal Status: Active Interventions: Assess: immobility, friction, shearing, incontinence upon admission and as needed Assess offloading mechanisms upon admission and as needed Provide education on pressure ulcers Treatment Activities: T ordered outside of clinic : 01/02/2022 est Notes: Electronic Signature(s) Signed: 06/28/2022 11:19:08 AM By: Fonnie Mu RN Entered By: Fonnie Mu on 06/26/2022 14:08:07 -------------------------------------------------------------------------------- Pain Assessment Details Patient Name: Date of Service: CO Deliah Goody Cardenas. 06/26/2022 1:15 PM Medical Record Number: [id number] Patient Account Number: [id number] Date of Birth/Sex: Treating RN: 28-Jun-1950 (71 y.o. F) Primary Care Riddick Nuon: [name] Other Clinician: Referring Shaquala Broeker: Treating Daun Rens/Extender: Crista Cardenas in Treatment: 25 Active Problems Location of Pain Severity and Description of Pain Patient Has Paino No Site Locations Pain Management and  Medication Current Pain Management: Electronic Signature(s) Signed: 06/28/2022 11:38:03 AM By: Thayer Dallas Entered By: Thayer Dallas on 06/26/2022 13:44:13 [name] Cardenas ([id number]) [id number] Page 5 of 6 -------------------------------------------------------------------------------- Patient/Caregiver Education Details Patient Name: Date of Service: [name] 4/17/2024andnbsp1:15 PM Medical Record Number: [id number] Patient Account Number: [id number] Date of Birth/Gender: Treating RN: 08-16-1950 (72 y.o. [name] Primary Care Physician: [name] Other Clinician: Referring Physician: Treating Physician/Extender: Crista Cardenas in Treatment: 25 Education Assessment Education Provided To: Patient Education Topics Provided Wound/Skin Impairment: Methods: Explain/Verbal Responses: Reinforcements needed, State content correctly [employer]) Signed: 06/28/2022 11:19:08 AM By: Fonnie Mu RN Entered By: Fonnie Mu on 06/26/2022 14:08:19 -------------------------------------------------------------------------------- Wound Assessment Details Patient Name: Date of Service: CO Deliah Goody Cardenas. 06/26/2022 1:15 PM Medical Record Number: [id number] Patient Account Number: [id number] Date of Birth/Sex: Treating RN: November 26, 1950 (72 y.o. F) Primary Care Altamese Deguire: [name] Other Clinician: Referring Christoper Bushey: Treating Dereona Kolodny/Extender: Crista Cardenas in Treatment: 25 Wound Status Wound Number: 1 Primary Etiology: Pressure Ulcer Wound Location: Sacrum Wound Status: Open Wounding Event: Pressure Injury Comorbid History: Colitis Date Acquired: 10/09/2021 Weeks Of Treatment: 25 Clustered Wound: No Photos Wound Measurements Length: (cm) 0.4 Width: (cm) 0.2 Depth: (cm) 1 Area: (cm) 0.063 Volume: (cm) 0.063 % Reduction in Area: 82.2% %  Reduction in Volume: 74.5% Tunneling: Yes Position (o'clock): 2 Maximum Distance: (cm) 1.4 Wound Description Classification: Category/Stage IV Yasuda, Doreen Cardenas ([id number]) Exudate Amount: Medium Exudate Type: Serosanguineous Exudate Color: red, brown Foul Odor After Cleansing: No [id number] Page 6 of 6 Slough/Fibrino No Wound Bed Granulation Amount: Large (67-100%) Necrotic Amount: None Present (0%) Periwound Skin Texture Texture Color No Abnormalities Noted: Yes No Abnormalities Noted: Yes Moisture No Abnormalities Noted: Yes Treatment Notes Wound #1 (Sacrum) Cleanser Wound Cleanser Discharge Instruction: Cleanse the wound with wound cleanser prior to applying a clean dressing using gauze sponges, not tissue or cotton balls. Peri-Wound Care Skin Prep Discharge Instruction: Use skin prep as directed Topical Gentamicin Discharge Instruction: As directed by physician Primary Dressing Hydrofera Blue Classic Foam Rope Dressing, 9x6 (mm/in) Discharge Instruction: Moisten with vashe and pack into wound. Reapply wound vac Friday. Secondary Dressing Secured With Compression Wrap Compression Stockings Add-Ons Electronic Signature(s) Signed: 06/28/2022 11:38:03 AM By: Thayer Dallas Entered By: Thayer Dallas on 06/26/2022 13:58:17 -------------------------------------------------------------------------------- Vitals Details Patient Name: Date of Service: CO Deliah Goody Cardenas. 06/26/2022 1:15 PM Medical Record Number: [id number] Patient Account Number: [id number] Date of Birth/Sex: Treating RN: 11/26/1950 (72 y.o. F) Primary Care Sarea Fyfe: Marissa Cloud., Mort Sawyers  Clinician: Referring Nikki Glanzer: Treating Brendin Situ/Extender: Crista Cardenas in Treatment: 25 Vital Signs Time Taken: 13:43 Temperature (F): 98.5 Height (in): 64 Pulse (bpm): 67 Weight (lbs): 97 Respiratory Rate (breaths/min): 16 Body Mass Index (BMI):  16.6 Blood Pressure (mmHg): 125/60 Reference Range: 80 - 120 mg / dl Electronic Signature(s) Signed: 06/28/2022 11:38:03 AM By: Thayer Dallas Entered By: Thayer Dallas on 06/26/2022 13:44:07

## 2022-07-01 ENCOUNTER — Encounter (HOSPITAL_BASED_OUTPATIENT_CLINIC_OR_DEPARTMENT_OTHER): Payer: 59 | Admitting: Internal Medicine

## 2022-07-01 DIAGNOSIS — R64 Cachexia: Secondary | ICD-10-CM | POA: Diagnosis not present

## 2022-07-01 DIAGNOSIS — R030 Elevated blood-pressure reading, without diagnosis of hypertension: Secondary | ICD-10-CM | POA: Diagnosis not present

## 2022-07-01 DIAGNOSIS — J449 Chronic obstructive pulmonary disease, unspecified: Secondary | ICD-10-CM | POA: Diagnosis not present

## 2022-07-01 DIAGNOSIS — M542 Cervicalgia: Secondary | ICD-10-CM | POA: Diagnosis not present

## 2022-07-01 DIAGNOSIS — L89154 Pressure ulcer of sacral region, stage 4: Secondary | ICD-10-CM | POA: Diagnosis not present

## 2022-07-01 DIAGNOSIS — E43 Unspecified severe protein-calorie malnutrition: Secondary | ICD-10-CM | POA: Diagnosis not present

## 2022-07-01 DIAGNOSIS — F32A Depression, unspecified: Secondary | ICD-10-CM | POA: Diagnosis not present

## 2022-07-01 DIAGNOSIS — G4700 Insomnia, unspecified: Secondary | ICD-10-CM | POA: Diagnosis not present

## 2022-07-01 DIAGNOSIS — M6281 Muscle weakness (generalized): Secondary | ICD-10-CM | POA: Diagnosis not present

## 2022-07-01 DIAGNOSIS — K861 Other chronic pancreatitis: Secondary | ICD-10-CM | POA: Diagnosis not present

## 2022-07-01 DIAGNOSIS — Z79899 Other long term (current) drug therapy: Secondary | ICD-10-CM | POA: Diagnosis not present

## 2022-07-01 DIAGNOSIS — E2740 Unspecified adrenocortical insufficiency: Secondary | ICD-10-CM | POA: Diagnosis not present

## 2022-07-01 DIAGNOSIS — Z87891 Personal history of nicotine dependence: Secondary | ICD-10-CM | POA: Diagnosis not present

## 2022-07-01 DIAGNOSIS — L89153 Pressure ulcer of sacral region, stage 3: Secondary | ICD-10-CM | POA: Diagnosis not present

## 2022-07-01 DIAGNOSIS — Z681 Body mass index (BMI) 19 or less, adult: Secondary | ICD-10-CM | POA: Diagnosis not present

## 2022-07-01 NOTE — Progress Notes (Signed)
KELIYAH, SPILLMAN Cardenas ([id number]) [id number] Page 1 of 3 Visit Report for 07/01/2022 Arrival Information Details Patient Name: Date of Service: [name] 07/01/2022 7:45 A M Medical Record Number: [id number] Patient Account Number: [id number] Date of Birth/Sex: Treating RN: [birth year] (72 y.o. [name], [name] Primary Care Estill Llerena: Jayme Cloud., [name] Other Clinician: Referring Khup Sapia: Treating Tequila Rottmann/Extender: Allena Katz., [name] in Treatment: 25 Visit Information History Since Last Visit Added or deleted any medications: No Patient Arrived: Ambulatory Any new allergies or adverse reactions: No Arrival Time: 07:55 Had a fall or experienced change in No Accompanied By: self activities of daily living that may affect Transfer Assistance: None risk of falls: Patient Identification Verified: Yes Signs or symptoms of abuse/neglect since last visito No Secondary Verification Process Completed: Yes Hospitalized since last visit: No Patient Requires Transmission-Based Precautions: No Implantable device outside of the clinic excluding No Patient Has Alerts: No cellular tissue based products placed in the center since last visit: Has Dressing in Place as Prescribed: Yes Pain Present Now: No Electronic Signature(s) Signed: 07/01/2022 4:27:44 PM By: Shawn Stall RN, BSN Entered By: Shawn Stall on 07/01/2022 08:27:24 -------------------------------------------------------------------------------- Encounter Discharge Information Details Patient Name: Date of Service: Marissa CKRELL, Marissa Sailors Cardenas. 07/01/2022 7:45 A M Medical Record Number: [id number] Patient Account Number: [id number] Date of Birth/Sex: Treating RN: 11-11-1950 (72 y.o. [name], [name] Primary Care Josalyn Dettmann: Jayme Cloud., [name] Other Clinician: Referring Messiah Ahr: Treating Kennon Encinas/Extender: Allena Katz., [name] in Treatment: 25 Encounter Discharge  Information Items Discharge Condition: Stable Ambulatory Status: Ambulatory Discharge Destination: Home Transportation: Private Auto Accompanied By: self Schedule Follow-up Appointment: Yes Clinical Summary of Care: Electronic Signature(s) Signed: 07/01/2022 4:27:44 PM By: Shawn Stall RN, BSN Entered By: Shawn Stall on 07/01/2022 08:28:55 -------------------------------------------------------------------------------- Negative Pressure Wound Therapy Maintenance (NPWT) Details Patient Name: Date of Service: [name] 07/01/2022 7:45 A M Medical Record Number: [id number] Patient Account Number: [id number] Date of Birth/Sex: Treating RN: 30-May-1950 (71 y.o. [name], [name] Primary Care Praneel Haisley: Jayme Cloud., [name] Other Clinician: Referring Shawnique Mariotti: Treating Rooney Swails/Extender: Allena Katz., [name] in Treatment: 25 NPWT Maintenance Performed for: Wound #1 Sacrum Performed By: Shawn Stall, RN Type: VAC System Coverage Size (sq cm): 0.08 [name], [name] ([id number]) [phone].pdf Page 2 of 3 Pressure Type: Constant Pressure Setting: 125 mmHG Drain Type: None Primary Contact: Non-Adherent Sponge/Dressing Type: Foam- Black,Gauze Date Initiated: 06/05/2022 Dressing Removed: Yes Quantity of Sponges/Gauze Removed: x1 white gauze and x1 black foam Canister Changed: No Canister Exudate Volume: 0 Dressing Reapplied: Yes Quantity of Sponges/Gauze Inserted: x1 white gauze; x1 black foam bridged to right hip Respones T Treatment: o tolerated well Days On NPWT : 26 Electronic Signature(s) Signed: 07/01/2022 4:27:44 PM By: Shawn Stall RN, BSN Entered By: Shawn Stall on 07/01/2022 08:28:36 -------------------------------------------------------------------------------- Wound Assessment Details Patient Name: Date of Service: Marissa Deliah Goody Cardenas. 07/01/2022 7:45 A M Medical Record Number: [id number] Patient Account Number:  [id number] Date of Birth/Sex: Treating RN: 07/29/1950 (72 y.o. [name], [name] Primary Care Jourdan Durbin: Jayme Cloud., [name] Other Clinician: Referring Catheleen Langhorne: Treating Braidyn Scorsone/Extender: Crissie Figures in Treatment: 25 Wound Status Wound Number: 1 Primary Etiology: Pressure Ulcer Wound Location: Sacrum Wound Status: Open Wounding Event: Pressure Injury Date Acquired: 10/09/2021 Weeks Of Treatment: 25 Clustered Wound: No Wound Measurements Length: (cm) 0.4 Width: (cm) 0.2 Depth: (cm) 1 Area: (cm) 0.063 Volume: (cm) 0.063 % Reduction in Area: 82.2% % Reduction in Volume: 74.5% Wound Description Classification:  Category/Stage IV Exudate Amount: Medium Exudate Type: Serosanguineous Exudate Color: red, brown Periwound Skin Texture Texture Color No Abnormalities Noted: No No Abnormalities Noted: No Moisture No Abnormalities Noted: No Treatment Notes Wound #1 (Sacrum) Cleanser Wound Cleanser Discharge Instruction: Cleanse the wound with wound cleanser prior to applying a clean dressing using gauze sponges, not tissue or cotton balls. Peri-Wound Care Skin Prep Discharge Instruction: Use skin prep as directed Topical Gentamicin [name], [name] ([id number]) [phone].pdf Page 3 of 3 Discharge Instruction: As directed by physician Primary Dressing Hydrofera Blue Classic Foam Rope Dressing, 9x6 (mm/in) Discharge Instruction: Moisten with vashe and pack into wound. Reapply wound vac Friday. Secondary Dressing Secured With Compression Wrap Compression Stockings Add-Ons Electronic Signature(s) Signed: 07/01/2022 4:27:44 PM By: Shawn Stall RN, BSN Entered By: Shawn Stall on 07/01/2022 08:27:35

## 2022-07-01 NOTE — Progress Notes (Signed)
PERIAN, TEDDER R ([id number]) [id number] Page 1 of 1 Visit Report for 07/01/2022 SuperBill Details Patient Name: Date of Service: [name] 07/01/2022 Medical Record Number: [id number] Patient Account Number: [id number] Date of Birth/Sex: Treating RN: [birth year] (72 y.o. [name], [name] Primary Care Provider: Jayme Cloud., [name] Other Clinician: Referring Provider: Treating Provider/Extender: Crissie Figures in Treatment: 25 Diagnosis Coding ICD-10 Codes Code Description [phone] Pressure ulcer of sacral region, stage 4 M62.81 Muscle weakness (generalized) E43 Unspecified severe protein-calorie malnutrition Facility Procedures CPT4 Code Description Modifier Quantity [id number] [phone] - WOUND VAC-50 SQ CM OR LESS 1 Electronic Signature(s) Signed: 07/01/2022 9:25:08 AM By: Geralyn Corwin DO Signed: 07/01/2022 4:27:44 PM By: Shawn Stall RN, BSN Entered By: Shawn Stall on 07/01/2022 08:29:02

## 2022-07-03 ENCOUNTER — Encounter (HOSPITAL_BASED_OUTPATIENT_CLINIC_OR_DEPARTMENT_OTHER): Payer: 59 | Admitting: Physician Assistant

## 2022-07-03 DIAGNOSIS — Z681 Body mass index (BMI) 19 or less, adult: Secondary | ICD-10-CM | POA: Diagnosis not present

## 2022-07-03 DIAGNOSIS — L89153 Pressure ulcer of sacral region, stage 3: Secondary | ICD-10-CM | POA: Diagnosis not present

## 2022-07-03 DIAGNOSIS — L89154 Pressure ulcer of sacral region, stage 4: Secondary | ICD-10-CM | POA: Diagnosis not present

## 2022-07-03 DIAGNOSIS — E43 Unspecified severe protein-calorie malnutrition: Secondary | ICD-10-CM | POA: Diagnosis not present

## 2022-07-03 DIAGNOSIS — M6281 Muscle weakness (generalized): Secondary | ICD-10-CM | POA: Diagnosis not present

## 2022-07-03 NOTE — Progress Notes (Addendum)
AKEIBA, AXELSON R ([id number]) [id number] Page 1 of 10 Visit Report for 07/03/2022 Chief Complaint Document Details Patient Name: Date of Service: [name]. 07/03/2022 11:00 A M Medical Record Number: [id number] Patient Account Number: [id number] Date of Birth/Sex: Treating RN: June 07, 1950 (72 y.o. F) Primary Care Provider: Jayme Cloud., [name] Other Clinician: Referring Provider: Treating Provider/Extender: Crista Curb in Treatment: 26 Information Obtained from: Patient Chief Complaint Pressure ulcer stage 3 Sacrum Electronic Signature(s) Signed: 07/03/2022 10:54:22 AM By: Allen Derry PA-C Entered By: Allen Derry on 07/03/2022 10:54:22 -------------------------------------------------------------------------------- Debridement Details Patient Name: Date of Service: [name] 07/03/2022 11:00 A M Medical Record Number: [id number] Patient Account Number: [id number] Date of Birth/Sex: Treating RN: January 20, 1951 (72 y.o. [name], [name] Primary Care Provider: Jayme Cloud., [name] Other Clinician: Referring Provider: Treating Provider/Extender: Crista Curb in Treatment: 26 Debridement Performed for Assessment: Wound #1 Sacrum Performed By: Physician Lenda Kelp, PA Debridement Type: Debridement Level of Consciousness (Pre-procedure): Awake and Alert Pre-procedure Verification/Time Out Yes - 11:30 Taken: Start Time: 11:31 Pain Control: Lidocaine 5% topical ointment Percent of Wound Bed Debrided: 100% T Area Debrided (cm): otal 0.06 Tissue and other material debrided: Viable, Subcutaneous, Skin: Dermis , Skin: Epidermis Level: Skin/Subcutaneous Tissue Debridement Description: Excisional Instrument: Curette Bleeding: Moderate Hemostasis Achieved: Pressure End Time: 11:40 Procedural Pain: 0 Post Procedural Pain: 0 Response to Treatment: Procedure was tolerated well Level of  Consciousness (Post- Awake and Alert procedure): Post Debridement Measurements of Total Wound Length: (cm) 0.4 Stage: Category/Stage IV Width: (cm) 0.2 Depth: (cm) 1 Volume: (cm) 0.063 Character of Wound/Ulcer Post Debridement: Improved Post Procedure Diagnosis Same as Pre-procedure Electronic Signature(s) Signed: 07/03/2022 5:22:52 PM By: Allen Derry PA-C Signed: 07/04/2022 4:51:53 PM By: Shawn Stall RN, BSN Entered By: Shawn Stall on 07/03/2022 11:46:28 [name] ([id number]) [id number] Page 2 of 10 -------------------------------------------------------------------------------- HPI Details Patient Name: Date of Service: [name] 07/03/2022 11:00 A M Medical Record Number: [id number] Patient Account Number: [id number] Date of Birth/Sex: Treating RN: Apr 20, 1950 (72 y.o. F) Primary Care Provider: Jayme Cloud., [name] Other Clinician: Referring Provider: Treating Provider/Extender: Crista Curb in Treatment: 26 History of Present Illness HPI Description: 01-02-22 upon evaluation today patient presents for initial inspection here in our clinic concerning a wound that she has over the sacral region. This is stated to be present since around the beginning of August 2023. She currently resides in a assisted nursing facility. She does seem to be able to answer questions fully today. Subsequently I do note that she is a little on the thin side but again other than the protein calorie malnutrition she is minimally weak but still does get up and move around some but she also tells me that she "sits a lot". She has not had any x-rays of the sacral region at this point. 01-09-2022 upon evaluation today patient's wound in the sacral area actually appears to be doing decently well. Fortunately I do not see any signs of infection which is great news and overall I am extremely pleased with where things stand today. 11/8;  this is a patient who lives in some form of small assisted living in [place]. She has a deep wound over the lower part of her coccyx. An x-ray that we ordered from last time did not show any osseous abnormalities. We are supposed to be using Hydrofera Blue in the wound but I am really  not sure what they are using to dress this and who is doing it. Talking to our staff they have apparently discussed this with the staff in the facility. She does not have home health. 01-23-2022 upon evaluation today patient appears to be doing decently well in regard to her wound. She has been tolerating the dressing changes without complication although I am not certain the dressing changes have been done appropriately over the past couple of weeks. We have been trying to get her to come in here we also try to get her into a wound care center in [place] which would be closer for the assisted living facility. Unfortunately neither 1 of those were undertaken up to this point. The patient did end up having some training of the staff from an RN that they brought him to have the staff perform the dressing changes but that being said it does not sound like this has been done correctly. Nonetheless I do believe that based on what we see it would benefit the patient to actually have her come here 3 times a week also think a wound VAC could potentially be a possibility for her which would help to get things moving in a much better direction as far as healing is concerned. We need to get this to fill-in though it looks clean and does not look infected I do think that we need to really get this to fill-in and there is quite a bit of undermining that is to be considered here. 01-30-2022 upon evaluation today patient appears to be doing well currently in regard to her wound which is looking pretty decent but still has quite a bit of space underneath as far as undermining is concerned. I think she might possibly be better with a wound  VAC. With that being said if when I do this we probably only be able to do it 2 times a week at most. I discussed that with the patient today she is okay with that we just need to see if we can get the insurance approval and then of course the scheduling underway. 02-13-2022 upon evaluation today patient appears to be doing well currently in regard to her wound. She actually tells me that it is feeling a lot better which is good news. Fortunately I do not see any signs of active infection at this time. No fevers, chills, nausea, vomiting, or diarrhea. 02-27-2022 upon evaluation today patient appears to be doing well currently in regard to her wound. She is actually showing some signs of improvement we are still obtaining the approval for the snap VAC which I think could be beneficial in the meantime we been using the Hydrofera Blue rope. 03-27-2022 upon evaluation today patient appears to be doing okay in regard to her wound but there was no dressing in place upon arrival today. Fortunately I see again no evidence of infection. No fevers, chills, nausea, vomiting, or diarrhea. 04-03-2022 upon evaluation today patient did not have a dressing on when she came into the office. With that being said she actually tells me that it was on until yesterday she took a shower and it came loose and then today came off completely. With that being said I really do not think she needs to be taking shower every single day or having to see her here in the clinic 3 times per week on Monday, Wednesday, and Friday and during those times obviously in between she needs to probably avoid showering. For that reason what  I advised her to do would be to shower in the mornings on the days that she comes to see [place] that way if the dressing does come off or start to come off we will be changing it anyway. She voiced understanding and tells me she can do that. Unfortunately the facility has nobody to help her with changing the dressings  and we are not currently having to do that here in the clinic again. This means that during the time that she was not coming in from December 20 through when I saw her last week on the 17th there was a period of 2 weeks where the wound really was not changed at all as the RN that Baird Lyons tell me they had at the facility did not end up working out. This obviously is not good and is not what we expect to see as far as patient care is concerned which is why we are now seeing her 3 times a week here in the clinic. 04-10-2022 upon evaluation today patient appears to be doing well currently in regard to her wound which is actually showing signs of good granulation epithelization at this point. However she does have quite a bit of drainage and we are little concerned about the possibility of infection. For that reason I think she could benefit from a PCR culture followed by [facility] topical antibiotics. I am also thinking of going ahead and doing gentamicin today as well. 2/7; small wound on the lower sacrum however with considerable degree of undermining from roughly 7-4. PCR culture that was done last week showed "no organisms". We have been using gentamicin and iodoform packing.She lives in some form of group home in [place] I believe. Uncertain about the adequacy of offloading this area 04-24-2022 upon evaluation patient's wound actually is showing signs of doing decently well I do believe she would benefit from a snap VAC and we discussed that again today. Fortunately I do not see any signs of active infection locally nor systemically which is great news. No fevers, chills, nausea, vomiting, or diarrhea. 05-01-2022 upon evaluation today patient's wound is really doing about the same. Fortunately I do not see any signs of active infection locally nor systemically at this time which is great news with that being said we have gotten approval for the snap VAC and we will going to go ahead and place that  today. 05-08-2022 upon evaluation today patient appears to be doing well currently in regard to her wound all things considered but were having a difficult time with a snap VAC suctioning appropriately. With that being said we will get a go ahead and likely avoid the snap VAC at this point at least until we can get the actual brace dressing as bridging ourselves does not seem to be working nearly as well. 3/6; I note the difficulty with a snap VAC which is disappointing but apparently another type of VAC is being considered. Also a referral to plastic surgery at [facility]. The patient has a very small but deep and at the base circumferential undermining. She is coming in here now 3 times a week for [facility] placement 3/13; patient presents for follow-up. She has been using Hydrofera Blue 3 times a week. She comes into our clinic for nurse visits to have this done. We are still awaiting a wound VAC. She has no issues or complaints today. 05-29-2022 upon evaluation today patient appears to be doing about the same in regard to her wound. She still  has significant undermining although the opening is a huge the undermining is significant. I do believe that a gauze VAC would be beneficial here. The patient does not eat normally and in fact tells me that she ADALEY, KIENE R ([id number]) [id number] Page 3 of 10 eats very well. She also is doing supplements with Juven at this point. She also has a good ability to get up and move around she is completely ambulatory and does not just lay with pressure on this area. She also is able to roll and reposition in bed without complication. For that reason she really has no need for a group 2 mattress at this point whatsoever this would be a complete waste of resources as far as insurance and medical equipment is concerned. As far as her urinary incontinence she has no issues with incontinence at this point she is able to carry herself to  the bathroom when needed she does wear a depends just in case but again that is not a routinely utilized item. 06-05-2022 upon evaluation today patient appears to be doing decently well in regard to her wound I do not see any signs of infection overall she seems to be doing quite well. I do think that she is making good progress in general here. We do have the wound VAC however and I think this is going to potentially help to get this. Hand has been the biggest issue we have had is getting this to actually feeling more effectively. 06-12-2022 upon evaluation today patient appears to be doing okay in regard to her wound. We are still seeing some signs of improvement. Fortunately there does not appear to be any evidence of active infection at this time which is good news she did see the doctors over at [facility] yesterday and they are ordering an MRI in preparation for considering possibility of a surgical closure. The patient voiced understanding she had forgotten we have made this referral. T be o honest I was back in February kind of forgot about the referral to in the interim trying to proceed with her wound care. Nonetheless I appreciate their be evaluation of this and again we will see what the MRI shows. 06-19-2022 upon evaluation today patient's wound is actually showing signs of good improvement and very pleased with where we stand today. Fortunately I do not see any evidence of infection locally nor systemically which is great news I think the wound VAC is doing a good job of undermining seems less than last week. 06-26-2022 upon evaluation today patient appears to be doing well with regard to her wound although her wound VAC it actually died she had forgotten to charge it overnight. With that being said she does seem to be doing decently well at this point based on what I am seeing I think that the undermining is much less than what it was previous. Fortunately I do not see any signs of active  infection locally nor systemically at this time. 07-03-2022 upon evaluation today patient appears to be doing decently well in regard to her wound. She actually did have an MRI performed and I did review that today as well. It shows that she actually does have a focal marrow signal abnormality in the left aspect of the sacrum at the S4 level with adjacent soft tissue swelling and presumed sacral decubitus ulcer concerning for a small area of osteomyelitis. Otherwise there did not appear to be any fluid collection which is good news there is no signs of  an obvious abscess. Electronic Signature(s) Signed: 07/03/2022 1:01:35 PM By: Allen Derry PA-C Entered By: Allen Derry on 07/03/2022 13:01:35 -------------------------------------------------------------------------------- Physical Exam Details Patient Name: Date of Service: [name] 07/03/2022 11:00 A M Medical Record Number: [id number] Patient Account Number: [id number] Date of Birth/Sex: Treating RN: 05/16/1950 (72 y.o. F) Primary Care Provider: Jayme Cloud., [name] Other Clinician: Referring Provider: Treating Provider/Extender: Crista Curb in Treatment: 26 Constitutional Well-nourished and well-hydrated in no acute distress. Respiratory normal breathing without difficulty. Psychiatric this patient is able to make decisions and demonstrates good insight into disease process. Alert and Oriented x 3. pleasant and cooperative. Notes Upon inspection patient's wound is getting smaller to the point they were having a trouble getting the gauze back in. For that reason I am going to have to open up the opening a little bit so that we can continue to pack this appropriately as it is definitely not sealed up on the inside. She actually tolerated this debridement today I did number well during the through the process she had minimal discomfort she tells me and post debridement this actually appears to be doing  much better which is great news. Electronic Signature(s) Signed: 07/03/2022 1:02:09 PM By: Allen Derry PA-C Entered By: Allen Derry on 07/03/2022 13:02:09 -------------------------------------------------------------------------------- Physician Orders Details Patient Name: Date of Service: [name] 07/03/2022 11:00 A M Medical Record Number: [id number] Patient Account Number: [id number] Date of Birth/Sex: Treating RN: 1951-01-01 (72 y.o. [name], [name] Primary Care Provider: Jayme Cloud., [name] Other Clinician: Referring Provider: Treating Provider/Extender: Crista Curb in Treatment: 26 Verbal / Phone Orders: No Diagnosis [address] ALYN, RIEDINGER R ([id number]) [id number] Page 4 of 10 ICD-10 Coding Code Description L89.154 Pressure ulcer of sacral region, stage 4 M62.81 Muscle weakness (generalized) E43 Unspecified severe protein-calorie malnutrition Follow-up Appointments ppointment in 1 week. Leonard Schwartz Wednesday 115pm Room 9 07/10/2022 Return A ppointment in 2 weeks. Leonard Schwartz Wednesday 1100 room 8 07/17/2022 Return A Nurse Visit: - Mondays and Fridays wound vac changes Other: - Follow up with [facility] on 07/16/2022. Have blood work complete before next follow up appt with Leonard Schwartz. Anesthetic (In clinic) Topical Lidocaine 4% applied to wound bed Negative Presssure Wound Therapy Wound Vac to wound continuously at 117mm/hg pressure Black Foam - ****USE CUTIMED SORBACT SWAB AS PACKING INTO WOUND BED THEN BRIDGE WITH BLACK FOAM TO HIP.**** Off-Loading Turn and reposition every 2 hours - when sitting. walk hourly to aid in offloading pressure to buttock. Wound Treatment Wound #1 - Sacrum Cleanser: Vashe 5.8 (oz) 3 x Per Week Discharge Instructions: Cleanse the wound with Vashe prior to applying a clean dressing using gauze sponges, not tissue or cotton balls. Prim Dressing: Cutimed Sorbact Swab 3 x Per  Week ary Discharge Instructions: lightly pack into wound bed, tunnel, and undermining. Then apply wound vac. Prim Dressing: wound vac 3 x Per Week ary Discharge Instructions: apply over the cutimed sorbact swab and bridge to hip. Laboratory CBC W A Differential panel in Blood (HEM-CBC) - (ICD10 L89.154 - Pressure ulcer of sacral region, stage 4) uto LOINC Code: [phone] Convenience Name: CBC W Auto Differential panel Basic metabolic 2000 panel in Serum or Plasma (CHEM-panel) - (ICD10 L89.154 - Pressure ulcer of sacral region, stage 4) LOINC Code: 95638-7 Convenience Name: Basic Metabolic panelCMS C reactive protein [Mass/volume] in Serum or Plasma (CHEM) - (ICD10 L89.154 - Pressure ulcer of sacral region, stage 4) LOINC Code:  1610-9 Convenience Name: C Reactive Protein in serum or plasma Erythrocyte sedimentation rate (HEM) - (ICD10 L89.154 - Pressure ulcer of sacral region, stage 4) LOINC Code: 60454-0 Convenience Name: Sed rate-method unspecified Electronic Signature(s) Signed: 07/03/2022 5:22:52 PM By: Allen Derry PA-C Signed: 07/04/2022 4:51:53 PM By: Shawn Stall RN, BSN Entered By: Shawn Stall on 07/03/2022 11:42:11 Prescription 07/03/2022 -------------------------------------------------------------------------------- Wilfred Lacy PA Patient Name: Provider: 03-26-1950 [id number] Date of Birth: NPI#Sherral Hammers Sex: DEA #: [phone] Phone #: License #: UPN: Patient Address: [address] Eligha Bridegroom [facility] [place], [place] R ([id number]) [phone].pdf Page 5 of 10 [address] [place], [place] [address] [address] [place], [place] [address] [phone] Allergies No Known Allergies Provider's Orders CBC W A Differential panel in Blood - ICD10: L89.154 uto LOINC Code: 70623-7 Convenience Name: CBC W Auto Differential panel Hand Signature:  Date(s): Prescription 07/03/2022 Wilfred Lacy PA Patient Name: Provider: 1950-04-16 [id number] Date of Birth: NPI#: F [id number] Sex: DEA #: [phone] Phone #: License #: UPN: Patient Address: [address] Eligh[place] Bridegroom [facility] [address] [place], [place] [address] [address] [place], [place] [address] [phone] Allergies No Known Allergies Provider's Orders Basic metabolic 2000 panel in Serum or Plasma - ICD10: L89.154 LOINC Code: 24321-2 Convenience Name: Basic Metabolic panelCMS Hand Signature: Date(s): Prescription 07/03/2022 Wilfred Lacy PA Patient Name: Provider: Oct 09, 1950 [id number] Date of Birth: NPI#: F [id number] Sex: DEA #: [phone] Phone #: License #: [employer] Signs: Patient Address: [address] Eligha Bridegroom [facility] [address] [place], [place] [address] [address] Floor [place], [place] [address] [phone] Allergies No Known Allergies Provider's Orders C reactive protein [Mass/volume] in Serum or Plasma - ICD10: L89.154 LOINC Code: 1988-5 Convenience Name: C Reactive Protein in serum or plasma Hand Signature: Date(s): Prescription 07/03/2022 Wilfred Lacy PA Patient Name: Provider: 1950-09-03 [id number] Date of Birth: NPI#ALSACE, DOWD ([id number]) [id number] Page 6 of 10 F [id number] Sex: DEA #: [phone] Phone #: License #: UPN: Patient Address: [address] [place] Bridegroom [facility] [address] [place] [address] [place], [place] [address] [address] [place], [place] [address] [phone] Allergies No Known Allergies Provider's Orders Erythrocyte sedimentation rate - ICD10: L89.154 LOINC Code: 02774-1 Convenience Name: Sed rate-method unspecified Hand Signature: Date(s): Electronic Signature(s) Signed:  07/03/2022 5:22:52 PM By: Allen Derry PA-C Signed: 07/04/2022 4:51:53 PM By: Shawn Stall RN, BSN Entered By: Shawn Stall on 07/03/2022 11:42:11 -------------------------------------------------------------------------------- Problem List Details Patient Name: Date of Service: [name] 07/03/2022 11:00 A M Medical Record Number: [id number] Patient Account Number: [id number] Date of Birth/Sex: Treating RN: 09/24/1950 (72 y.o. F) Primary Care Provider: Jayme Cloud., [name] Other Clinician: Referring Provider: Treating Provider/Extender: Crista Curb in Treatment: 26 Active Problems ICD-10 Encounter Code Description Active Date MDM Diagnosis L89.154 Pressure ulcer of sacral region, stage 4 01/02/2022 No Yes M62.81 Muscle weakness (generalized) 01/02/2022 No Yes E43 Unspecified severe protein-calorie malnutrition 01/02/2022 No Yes Inactive Problems Resolved Problems Electronic Signature(s) Signed: 07/03/2022 10:54:15 AM By: Allen Derry PA-C Entered By: Allen Derry on 07/03/2022 10:54:14 -------------------------------------------------------------------------------- Progress Note Details Patient Name: Date of Service: [name] 07/03/2022 11:00 A WILLENA, JEANCHARLES R ([id number]) [phone].pdf Page 7 of 10  Medical Record Number: [id number] Patient Account Number: [id number] Date of Birth/Sex: Treating RN: [birth year] (72 y.o. F) Primary Care Provider: Jayme Cloud., [name] Other Clinician: Referring Provider: Treating Provider/Extender: Crista Curb in Treatment: 26 Subjective Chief Complaint Information obtained from Patient Pressure ulcer stage 3 Sacrum History of Present Illness (HPI) 01-02-22 upon evaluation today patient presents for initial inspection here in our clinic concerning a wound that she has over the sacral region. This is stated to be present since around the  beginning of August 2023. She currently resides in a assisted nursing facility. She does seem to be able to answer questions fully today. Subsequently I do note that she is a little on the thin side but again other than the protein calorie malnutrition she is minimally weak but still does get up and move around some but she also tells me that she "sits a lot". She has not had any x-rays of the sacral region at this point. 01-09-2022 upon evaluation today patient's wound in the sacral area actually appears to be doing decently well. Fortunately I do not see any signs of infection which is great news and overall I am extremely pleased with where things stand today. 11/8; this is a patient who lives in some form of small assisted living in [place]. She has a deep wound over the lower part of her coccyx. An x-ray that we ordered from last time did not show any osseous abnormalities. We are supposed to be using Hydrofera Blue in the wound but I am really not sure what they are using to dress this and who is doing it. Talking to our staff they have apparently discussed this with the staff in the facility. She does not have home health. 01-23-2022 upon evaluation today patient appears to be doing decently well in regard to her wound. She has been tolerating the dressing changes without complication although I am not certain the dressing changes have been done appropriately over the past couple of weeks. We have been trying to get her to come in here we also try to get her into a wound care center in [place] which would be closer for the assisted living facility. Unfortunately neither 1 of those were undertaken up to this point. The patient did end up having some training of the staff from an RN that they brought him to have the staff perform the dressing changes but that being said it does not sound like this has been done correctly. Nonetheless I do believe that based on what we see it would benefit the  patient to actually have her come here 3 times a week also think a wound VAC could potentially be a possibility for her which would help to get things moving in a much better direction as far as healing is concerned. We need to get this to fill-in though it looks clean and does not look infected I do think that we need to really get this to fill-in and there is quite a bit of undermining that is to be considered here. 01-30-2022 upon evaluation today patient appears to be doing well currently in regard to her wound which is looking pretty decent but still has quite a bit of space underneath as far as undermining is concerned. I think she might possibly be better with a wound VAC. With that being said if when I do this we probably only be able to do it 2 times a week at most. I discussed that  with the patient today she is okay with that we just need to see if we can get the insurance approval and then of course the scheduling underway. 02-13-2022 upon evaluation today patient appears to be doing well currently in regard to her wound. She actually tells me that it is feeling a lot better which is good news. Fortunately I do not see any signs of active infection at this time. No fevers, chills, nausea, vomiting, or diarrhea. 02-27-2022 upon evaluation today patient appears to be doing well currently in regard to her wound. She is actually showing some signs of improvement we are still obtaining the approval for the snap VAC which I think could be beneficial in the meantime we been using the Hydrofera Blue rope. 03-27-2022 upon evaluation today patient appears to be doing okay in regard to her wound but there was no dressing in place upon arrival today. Fortunately I see again no evidence of infection. No fevers, chills, nausea, vomiting, or diarrhea. 04-03-2022 upon evaluation today patient did not have a dressing on when she came into the office. With that being said she actually tells me that it was  on until yesterday she took a shower and it came loose and then today came off completely. With that being said I really do not think she needs to be taking shower every single day or having to see her here in the clinic 3 times per week on Monday, Wednesday, and Friday and during those times obviously in between she needs to probably avoid showering. For that reason what I advised her to do would be to shower in the mornings on the days that she comes to see [place] that way if the dressing does come off or start to come off we will be changing it anyway. She voiced understanding and tells me she can do that. Unfortunately the facility has nobody to help her with changing the dressings and we are not currently having to do that here in the clinic again. This means that during the time that she was not coming in from December 20 through when I saw her last week on the 17th there was a period of 2 weeks where the wound really was not changed at all as the RN that Baird Lyons tell me they had at the facility did not end up working out. This obviously is not good and is not what we expect to see as far as patient care is concerned which is why we are now seeing her 3 times a week here in the clinic. 04-10-2022 upon evaluation today patient appears to be doing well currently in regard to her wound which is actually showing signs of good granulation epithelization at this point. However she does have quite a bit of drainage and we are little concerned about the possibility of infection. For that reason I think she could benefit from a PCR culture followed by [facility] topical antibiotics. I am also thinking of going ahead and doing gentamicin today as well. 2/7; small wound on the lower sacrum however with considerable degree of undermining from roughly 7-4. PCR culture that was done last week showed "no organisms". We have been using gentamicin and iodoform packing.She lives in some form of group home in [place] I  believe. Uncertain about the adequacy of offloading this area 04-24-2022 upon evaluation patient's wound actually is showing signs of doing decently well I do believe she would benefit from a snap VAC and we discussed that again today. Fortunately  I do not see any signs of active infection locally nor systemically which is great news. No fevers, chills, nausea, vomiting, or diarrhea. 05-01-2022 upon evaluation today patient's wound is really doing about the same. Fortunately I do not see any signs of active infection locally nor systemically at this time which is great news with that being said we have gotten approval for the snap VAC and we will going to go ahead and place that today. 05-08-2022 upon evaluation today patient appears to be doing well currently in regard to her wound all things considered but were having a difficult time with a snap VAC suctioning appropriately. With that being said we will get a go ahead and likely avoid the snap VAC at this point at least until we can get the actual brace dressing as bridging ourselves does not seem to be working nearly as well. 3/6; I note the difficulty with a snap VAC which is disappointing but apparently another type of VAC is being considered. Also a referral to plastic surgery at [facility]. The patient has a very small but deep and at the base circumferential undermining. She is coming in here now 3 times a week for [facility] placement 3/13; patient presents for follow-up. She has been using Hydrofera Blue 3 times a week. She comes into our clinic for nurse visits to have this done. We are still awaiting a wound VAC. She has no issues or complaints today. 05-29-2022 upon evaluation today patient appears to be doing about the same in regard to her wound. She still has significant undermining although the opening is a huge the undermining is significant. I do believe that a gauze VAC would be beneficial here. The patient does not eat normally and  in fact tells me that she eats very well. She also is doing supplements with Juven at this point. She also has a good ability to get up and move around she is completely ambulatory and does not just lay with pressure on this area. She also is able to roll and reposition in bed without complication. For that reason she really has no need for a SONOMA, FIRKUS R ([id number]) [id number] Page 8 of 10 group 2 mattress at this point whatsoever this would be a complete waste of resources as far as insurance and medical equipment is concerned. As far as her urinary incontinence she has no issues with incontinence at this point she is able to carry herself to the bathroom when needed she does wear a depends just in case but again that is not a routinely utilized item. 06-05-2022 upon evaluation today patient appears to be doing decently well in regard to her wound I do not see any signs of infection overall she seems to be doing quite well. I do think that she is making good progress in general here. We do have the wound VAC however and I think this is going to potentially help to get this. Hand has been the biggest issue we have had is getting this to actually feeling more effectively. 06-12-2022 upon evaluation today patient appears to be doing okay in regard to her wound. We are still seeing some signs of improvement. Fortunately there does not appear to be any evidence of active infection at this time which is good news she did see the doctors over at [facility] yesterday and they are ordering an MRI in preparation for considering possibility of a surgical closure. The patient voiced understanding she had forgotten we have made this referral.  T be o honest I was back in February kind of forgot about the referral to in the interim trying to proceed with her wound care. Nonetheless I appreciate their be evaluation of this and again we will see what the MRI shows. 06-19-2022 upon  evaluation today patient's wound is actually showing signs of good improvement and very pleased with where we stand today. Fortunately I do not see any evidence of infection locally nor systemically which is great news I think the wound VAC is doing a good job of undermining seems less than last week. 06-26-2022 upon evaluation today patient appears to be doing well with regard to her wound although her wound VAC it actually died she had forgotten to charge it overnight. With that being said she does seem to be doing decently well at this point based on what I am seeing I think that the undermining is much less than what it was previous. Fortunately I do not see any signs of active infection locally nor systemically at this time. 07-03-2022 upon evaluation today patient appears to be doing decently well in regard to her wound. She actually did have an MRI performed and I did review that today as well. It shows that she actually does have a focal marrow signal abnormality in the left aspect of the sacrum at the S4 level with adjacent soft tissue swelling and presumed sacral decubitus ulcer concerning for a small area of osteomyelitis. Otherwise there did not appear to be any fluid collection which is good news there is no signs of an obvious abscess. Objective Constitutional Well-nourished and well-hydrated in no acute distress. Vitals Time Taken: 11:01 AM, Height: 64 in, Weight: 97 lbs, BMI: 16.6, Pulse: 62 bpm, Respiratory Rate: 18 breaths/min, Blood Pressure: 103/64 mmHg. Respiratory normal breathing without difficulty. Psychiatric this patient is able to make decisions and demonstrates good insight into disease process. Alert and Oriented x 3. pleasant and cooperative. General Notes: Upon inspection patient's wound is getting smaller to the point they were having a trouble getting the gauze back in. For that reason I am going to have to open up the opening a little bit so that we can continue to  pack this appropriately as it is definitely not sealed up on the inside. She actually tolerated this debridement today I did number well during the through the process she had minimal discomfort she tells me and post debridement this actually appears to be doing much better which is great news. Integumentary (Hair, Skin) Wound #1 status is Open. Original cause of wound was Pressure Injury. The date acquired was: 10/09/2021. The wound has been in treatment 26 weeks. The wound is located on the Sacrum. The wound measures 0.4cm length x 0.2cm width x 1cm depth; 0.063cm^2 area and 0.063cm^3 volume. There is no undermining noted, however, there is tunneling at 1:00 with a maximum distance of 1.5cm. There is a medium amount of serosanguineous drainage noted. Foul odor after cleansing was noted. There is large (67-100%) pink granulation within the wound bed. The periwound skin appearance exhibited: Maceration. Assessment Active Problems ICD-10 Pressure ulcer of sacral region, stage 4 Muscle weakness (generalized) Unspecified severe protein-calorie malnutrition Procedures Wound #1 Pre-procedure diagnosis of Wound #1 is a Pressure Ulcer located on the Sacrum . There was a Excisional Skin/Subcutaneous Tissue Debridement with a total area of 0.06 sq cm performed by Lenda Kelp, PA. With the following instrument(s): Curette to remove Viable tissue/material. Material removed includes Subcutaneous Tissue, Skin: Dermis, and Skin: Epidermis  after achieving pain control using Lidocaine 5% topical ointment. A time out was conducted at 11:30, prior to the start of the procedure. A Moderate amount of bleeding was controlled with Pressure. The procedure was tolerated well with a pain level of 0 throughout and a pain level of 0 following the procedure. Post Debridement Measurements: 0.4cm length x 0.2cm width x 1cm depth; 0.063cm^3 volume. Post debridement Stage noted as Category/Stage IV. Character of Wound/Ulcer  Post Debridement is improved. DOAA, KENDZIERSKI R ([id number]) [id number] Page 9 of 10 Post procedure Diagnosis Wound #1: Same as Pre-Procedure Plan Follow-up Appointments: Return Appointment in 1 week. Leonard Schwartz Wednesday 115pm Room 9 07/10/2022 Return Appointment in 2 weeks. Leonard Schwartz Wednesday 1100 room 8 07/17/2022 Nurse Visit: - Mondays and Fridays wound vac changes Other: - Follow up with [facility] on 07/16/2022. Have blood work complete before next follow up appt with Leonard Schwartz. Anesthetic: (In clinic) Topical Lidocaine 4% applied to wound bed Negative Presssure Wound Therapy: Wound Vac to wound continuously at 158mm/hg pressure Black Foam - ****USE CUTIMED SORBACT SWAB AS PACKING INTO WOUND BED THEN BRIDGE WITH BLACK FOAM TO HIP.**** Off-Loading: Turn and reposition every 2 hours - when sitting. walk hourly to aid in offloading pressure to buttock. Laboratory ordered were: CBC W Auto Differential panel, Basic Metabolic panel CMS, C Reactive Protein in serum or plasma, Sed rate -method unspecified WOUND #1: - Sacrum Wound Laterality: Cleanser: Vashe 5.8 (oz) 3 x Per Week/ Discharge Instructions: Cleanse the wound with Vashe prior to applying a clean dressing using gauze sponges, not tissue or cotton balls. Prim Dressing: Cutimed Sorbact Swab 3 x Per Week/ ary Discharge Instructions: lightly pack into wound bed, tunnel, and undermining. Then apply wound vac. Prim Dressing: wound vac 3 x Per Week/ ary Discharge Instructions: apply over the cutimed sorbact swab and bridge to hip. 1. I am going to recommend currently based on what we see and that we have the patient go ahead and continue to monitor for any signs of infection or worsening. Based on what I am seeing I do believe that we are making good progress here but still this is very slow. Based on the MRI findings I am going to send her for some lab work which includes a CBC, CMP, sed rate, and C-reactive  protein to see where things stand. 2. I am going to recommend that we continue with the wound VAC and I am going to suggest that we use Sorbact to pack it and then subsequently we will put the [facility] over top. She is in agreement with this plan. 3. She also has an appointment on the seventh with plastic surgery to further discuss the possibility of any plastic surgery intervention for the current wound. Again we will see what they have to say at that point we will see where we are at that point. We will see patient back for reevaluation in 1 week here in the clinic. If anything worsens or changes patient will contact our office for additional recommendations. Electronic Signature(s) Signed: 07/03/2022 1:03:18 PM By: Allen Derry PA-C Entered By: Allen Derry on 07/03/2022 13:03:18 -------------------------------------------------------------------------------- SuperBill Details Patient Name: Date of Service: [name] 07/03/2022 Medical Record Number: [id number] Patient Account Number: [id number] Date of Birth/Sex: Treating RN: [birth year] (72 y.o. [name], [name] Primary Care Provider: Jayme Cloud., [name] Other Clinician: Referring Provider: Treating Provider/Extender: Crista Curb in Treatment: 26 Diagnosis Coding ICD-10 Codes Code Description [id number] Pressure ulcer  of sacral region, stage 4 M62.81 Muscle weakness (generalized) E43 Unspecified severe protein-calorie malnutrition Facility Procedures : CPT4 Code: 04540981 Description: 11042 - DEB SUBQ TISSUE 20 SQ CM/< ICD-10 Diagnosis Description L89.154 Pressure ulcer of sacral region, stage 4 Modifier: Quantity: 1 Physician Procedures : CPT4 Code Description Modifier SHENIECE, RUGGLES ([id number]) [id number] Page 10 of 1 [id number] 11042 - WC PHYS SUBQ TISS 20 SQ CM 1 ICD-10 Diagnosis Description L89.154 Pressure ulcer of sacral region, stage 4 Quantity: 0 Electronic  Signature(s) Signed: 07/03/2022 1:08:09 PM By: Allen Derry PA-C Entered By: Allen Derry on 07/03/2022 13:08:09

## 2022-07-04 DIAGNOSIS — Z79899 Other long term (current) drug therapy: Secondary | ICD-10-CM | POA: Diagnosis not present

## 2022-07-05 ENCOUNTER — Encounter (HOSPITAL_BASED_OUTPATIENT_CLINIC_OR_DEPARTMENT_OTHER): Payer: 59 | Admitting: Internal Medicine

## 2022-07-05 DIAGNOSIS — L89153 Pressure ulcer of sacral region, stage 3: Secondary | ICD-10-CM | POA: Diagnosis not present

## 2022-07-05 DIAGNOSIS — E43 Unspecified severe protein-calorie malnutrition: Secondary | ICD-10-CM | POA: Diagnosis not present

## 2022-07-05 DIAGNOSIS — Z681 Body mass index (BMI) 19 or less, adult: Secondary | ICD-10-CM | POA: Diagnosis not present

## 2022-07-05 DIAGNOSIS — M6281 Muscle weakness (generalized): Secondary | ICD-10-CM | POA: Diagnosis not present

## 2022-07-05 DIAGNOSIS — L89154 Pressure ulcer of sacral region, stage 4: Secondary | ICD-10-CM | POA: Diagnosis not present

## 2022-07-05 NOTE — Progress Notes (Signed)
Marissa, Cardenas R ([id number]) [id number] Page 1 of 6 Visit Report for 07/03/2022 Arrival Information Details Patient Name: Date of Service: [name]. 07/03/2022 11:00 A M Medical Record Number: [id number] Patient Account Number: [id number] Date of Birth/Sex: Treating RN: [birth year] (72 y.o. F) Primary Care Rook Maue: Jayme Cloud., [name] Other Clinician: Referring Yazan Gatling: Treating Evelin Cake/Extender: Crista Curb in Treatment: 26 Visit Information History Since Last Visit Added or deleted any medications: No Patient Arrived: Ambulatory Any new allergies or adverse reactions: No Arrival Time: 11:00 Had a fall or experienced change in No Accompanied By: self activities of daily living that may affect Transfer Assistance: None risk of falls: Patient Identification Verified: Yes Signs or symptoms of abuse/neglect since last visito No Secondary Verification Process Completed: Yes Hospitalized since last visit: No Patient Requires Transmission-Based Precautions: No Implantable device outside of the clinic excluding No Patient Has Alerts: No cellular tissue based products placed in the center since last visit: Has Dressing in Place as Prescribed: Yes Pain Present Now: No Electronic Signature(s) Signed: 07/03/2022 4:04:07 PM By: Thayer Dallas Entered By: Thayer Dallas on 07/03/2022 11:01:08 -------------------------------------------------------------------------------- Encounter Discharge Information Details Patient Name: Date of Service: Marissa Berthold R. 07/03/2022 11:00 A M Medical Record Number: [id number] Patient Account Number: [id number] Date of Birth/Sex: Treating RN: [birth year] (72 y.o. [name], [name] Primary Care Gumaro Brightbill: Jayme Cloud., [name] Other Clinician: Referring Kamari Buch: Treating Messi Twedt/Extender: Crista Curb in Treatment: 26 Encounter Discharge Information Items Post  Procedure Vitals Discharge Condition: Stable Pulse (bpm): 62 Ambulatory Status: Ambulatory Respiratory Rate (breaths/min): 18 Discharge Destination: Home Blood Pressure (mmHg): 103/64 Transportation: Private Auto Unable to obtain vitals Reason: unable to obtain temperature. Accompanied By: self Schedule Follow-up Appointment: Yes Clinical Summary of Care: Electronic Signature(s) Signed: 07/04/2022 4:51:53 PM By: Shawn Stall RN, BSN Entered By: Shawn Stall on 07/03/2022 11:50:35 -------------------------------------------------------------------------------- Lower Extremity Assessment Details Patient Name: Date of Service: CO Marissa Goody R. 07/03/2022 11:00 A M Medical Record Number: [id number] Patient Account Number: [id number] Date of Birth/Sex: Treating RN: Jun 17, 1950 (72 y.o. F) Primary Care Marien Manship: Jayme Cloud., [name] Other Clinician: Referring Sanaii Caporaso: Treating Kebrina Friend/Extender: Crista Curb in Treatment: 26 Electronic Signature(s) Signed: 07/03/2022 4:04:07 PM By: Presley Raddle ([id number]) [id number] Page 2 of 6 Entered By: Thayer Dallas on 07/03/2022 11:02:06 -------------------------------------------------------------------------------- Multi-Disciplinary Care Plan Details Patient Name: Date of Service: [name] 07/03/2022 11:00 A M Medical Record Number: [id number] Patient Account Number: [id number] Date of Birth/Sex: Treating RN: August 20, 1950 (72 y.o. [name], [name] Primary Care Ashtin Rosner: Jayme Cloud., [name] Other Clinician: Referring Arvada Seaborn: Treating Alexarae Oliva/Extender: Crista Curb in Treatment: 26 Active Inactive Osteomyelitis Nursing Diagnoses: Infection: osteomyelitis Knowledge deficit related to disease process and management Goals: Diagnostic evaluation for osteomyelitis completed as ordered Date Initiated: 07/03/2022 Target  Resolution Date: 06/26/2022 Goal Status: Active Patient/caregiver will verbalize understanding of disease process and disease management Date Initiated: 07/03/2022 Target Resolution Date: 07/19/2022 Goal Status: Active Interventions: Assess for signs and symptoms of osteomyelitis resolution every visit Provide education on osteomyelitis Screen for HBO Treatment Activities: Consult for HBO : 07/03/2022 MRI : 06/26/2022 Systemic antibiotics : 07/03/2022 White Blood Cell Scan : 07/03/2022 Notes: Pain, Acute or Chronic Nursing Diagnoses: Pain, acute or chronic: actual or potential Potential alteration in comfort, pain Goals: Patient will verbalize adequate pain control and receive pain control interventions during procedures as needed Date Initiated: 01/02/2022 Target Resolution  Date: 07/13/2022 Goal Status: Active Patient/caregiver will verbalize adequate pain control between visits Date Initiated: 01/02/2022 Target Resolution Date: 07/13/2022 Goal Status: Active Interventions: Encourage patient to take pain medications as prescribed Provide education on pain management Reposition patient for comfort Treatment Activities: Administer pain control measures as ordered : 01/02/2022 Notes: Pressure Nursing Diagnoses: Knowledge deficit related to management of pressures ulcers Goals: Patient/caregiver will verbalize risk factors for pressure ulcer development Date Initiated: 01/02/2022 Target Resolution Date: 07/13/2022 NANCI, LAKATOS ([id number]) [phone].pdf Page 3 of 6 Goal Status: Active Interventions: Assess: immobility, friction, shearing, incontinence upon admission and as needed Assess offloading mechanisms upon admission and as needed Provide education on pressure ulcers Treatment Activities: T ordered outside of clinic : 01/02/2022 est Notes: Electronic Signature(s) Signed: 07/04/2022 4:51:53 PM By: Shawn Stall RN, BSN Entered By: Shawn Stall  on 07/03/2022 11:31:24 -------------------------------------------------------------------------------- Negative Pressure Wound Therapy Maintenance (NPWT) Details Patient Name: Date of Service: [name] 07/03/2022 11:00 A M Medical Record Number: [id number] Patient Account Number: [id number] Date of Birth/Sex: Treating RN: 1950-10-14 (72 y.o. [name], [name] Primary Care Kenyen Candy: Jayme Cloud., [name] Other Clinician: Referring Ameirah Khatoon: Treating Landry Lookingbill/Extender: Crista Curb in Treatment: 26 NPWT Maintenance Performed for: Wound #1 Sacrum Performed By: Shawn Stall, RN Type: VAC System Coverage Size (sq cm): 0.08 Pressure Type: Constant Pressure Setting: 125 mmHG Drain Type: None Primary Contact: Non-Adherent Sponge/Dressing Type: Foam- Black Date Initiated: 06/05/2022 Dressing Removed: Yes Quantity of Sponges/Gauze Removed: x1 black foam and x1 gauze Canister Changed: No Canister Exudate Volume: 20 Dressing Reapplied: Yes Quantity of Sponges/Gauze Inserted: x1 cutimed sorbact swab and x1 black foam. Respones T Treatment: o tolerated well Days On NPWT : 29 Post Procedure Diagnosis Same as Pre-procedure Notes x1 cutimed sorbact swab lightly pack into tunnel and undermining. x1 black foam bridged to hip. Electronic Signature(s) Signed: 07/04/2022 4:51:53 PM By: Shawn Stall RN, BSN Entered By: Shawn Stall on 07/03/2022 11:48:55 -------------------------------------------------------------------------------- Pain Assessment Details Patient Name: Date of Service: CO Marissa Goody R. 07/03/2022 11:00 A M Medical Record Number: [id number] Patient Account Number: [id number] Date of Birth/Sex: Treating RN: 31-Jul-1950 (72 y.o. F) Primary Care Jaloni Davoli: Jayme Cloud., [name] Other Clinician: Referring Jakorey Mcconathy: Treating Jimma Ortman/Extender: Crista Curb in Treatment: 26 Active Problems Location of Pain Severity  and Description of Pain Patient Has Paino No Site Locations [place], Isabela R ([id number]) [phone].pdf Page 4 of 6 Pain Management and Medication Current Pain Management: Electronic Signature(s) Signed: 07/03/2022 4:04:07 PM By: Thayer Dallas Entered By: Thayer Dallas on 07/03/2022 11:01:59 -------------------------------------------------------------------------------- Patient/Caregiver Education Details Patient Name: Date of Service: [name] 4/24/2024andnbsp11:00 A M Medical Record Number: [id number] Patient Account Number: [id number] Date of Birth/Gender: Treating RN: 09-17-1950 (72 y.o. [name], [name] Primary Care Physician: Jayme Cloud., [name] Other Clinician: Referring Physician: Treating Physician/Extender: Crista Curb in Treatment: 26 Education Assessment Education Provided To: Patient Education Topics Provided Wound/Skin Impairment: Handouts: Caring for Your Ulcer Methods: Explain/Verbal Responses: Reinforcements needed Electronic Signature(s) Signed: 07/04/2022 4:51:53 PM By: Shawn Stall RN, BSN Entered By: Shawn Stall on 07/03/2022 11:46:37 -------------------------------------------------------------------------------- Wound Assessment Details Patient Name: Date of Service: Marissa Berthold R. 07/03/2022 11:00 A M Medical Record Number: [id number] Patient Account Number: [id number] Date of Birth/Sex: Treating RN: 03-23-1950 (72 y.o. F) Primary Care Carlus Stay: Jayme Cloud., [name] Other Clinician: Referring Laine Giovanetti: Treating Kimberley Dastrup/Extender: Crista Curb in Treatment: 26 Wound Status Wound Number:  1 Primary Etiology: Pressure Ulcer Wound Location: Sacrum Wound Status: Open Wounding Event: Pressure Injury Comorbid History: Colitis Date Acquired: 10/09/2021 [facility] Of Treatment: [address] R ([id number]) [phone].pdf Page 5 of  6 Clustered Wound: No Photos Wound Measurements Length: (cm) 0.4 Width: (cm) 0.2 Depth: (cm) 1 Area: (cm) 0.063 Volume: (cm) 0.063 % Reduction in Area: 82.2% % Reduction in Volume: 74.5% Tunneling: Yes Position (o'clock): 1 Maximum Distance: (cm) 1.5 Undermining: No Wound Description Classification: Category/Stage IV Exudate Amount: Medium Exudate Type: Serosanguineous Exudate Color: red, brown Foul Odor After Cleansing: Yes Due to Product Use: No Slough/Fibrino Yes Wound Bed Granulation Amount: Large (67-100%) Granulation Quality: Pink Periwound Skin Texture Texture Color No Abnormalities Noted: No No Abnormalities Noted: No Moisture No Abnormalities Noted: No Maceration: Yes Treatment Notes Wound #1 (Sacrum) Cleanser Vashe 5.8 (oz) Discharge Instruction: Cleanse the wound with Vashe prior to applying a clean dressing using gauze sponges, not tissue or cotton balls. Peri-Wound Care Topical Primary Dressing Cutimed Sorbact Swab Discharge Instruction: lightly pack into wound bed, tunnel, and undermining. Then apply wound vac. wound vac Discharge Instruction: apply over the cutimed sorbact swab and bridge to hip. Secondary Dressing Secured With Compression Wrap Compression Stockings Add-Ons Electronic Signature(s) Signed: 07/03/2022 4:04:07 PM By: Thayer Dallas Entered By: Thayer Dallas on 07/03/2022 11:15:57 [name] ([id number]) [id number] Page 6 of 6 -------------------------------------------------------------------------------- Vitals Details Patient Name: Date of Service: [name] 07/03/2022 11:00 A M Medical Record Number: [id number] Patient Account Number: [id number] Date of Birth/Sex: Treating RN: 10-May-1950 (72 y.o. F) Primary Care Rosana Farnell: Jayme Cloud., [name] Other Clinician: Referring Kamareon Sciandra: Treating Terryn Redner/Extender: Crista Curb in Treatment: 26 Vital  Signs Time Taken: 11:01 Pulse (bpm): 62 Height (in): 64 Respiratory Rate (breaths/min): 18 Weight (lbs): 97 Blood Pressure (mmHg): 103/64 Body Mass Index (BMI): 16.6 Reference Range: 80 - 120 mg / dl Electronic Signature(s) Signed: 07/03/2022 4:04:07 PM By: Thayer Dallas Entered By: Thayer Dallas on 07/03/2022 11:01:52

## 2022-07-06 NOTE — Progress Notes (Signed)
[name] ([id number]) [id number] Page 1 of 3 Visit Report for 07/05/2022 Arrival Information Details Patient Name: Date of Service: [name] 07/05/2022 11:30 A M Medical Record Number: [id number] Patient Account Number: [id number] Date of Birth/Sex: Treating RN: 08-30-1950 (72 y.o. [name], [name] Primary Care Akeyla Molden: [name]., [name] Other Clinician: Referring Linkin Vizzini: Treating Giovan Pinsky/Extender: Crissie Figures in Treatment: 26 Visit Information History Since Last Visit Added or deleted any medications: No Patient Arrived: Ambulatory Any new allergies or adverse reactions: No Arrival Time: 12:14 Had a fall or experienced change in No Accompanied By: self activities of daily living that may affect Transfer Assistance: None risk of falls: Patient Identification Verified: Yes Signs or symptoms of abuse/neglect since last visito No Secondary Verification Process Completed: Yes Hospitalized since last visit: No Patient Requires Transmission-Based Precautions: No Implantable device outside of the clinic excluding No Patient Has Alerts: No cellular tissue based products placed in the center since last visit: Has Dressing in Place as Prescribed: Yes Pain Present Now: No Electronic Signature(s) Signed: 07/05/2022 12:17:26 PM By: Fonnie Mu RN Entered By: Fonnie Mu on 07/05/2022 12:15:08 -------------------------------------------------------------------------------- Encounter Discharge Information Details Patient Name: Date of Service: Marissa Berthold Cardenas. 07/05/2022 11:30 A M Medical Record Number: [id number] Patient Account Number: [id number] Date of Birth/Sex: Treating RN: 1950/10/09 (72 y.o. [name], [name] Primary Care [name]: [name]., [name] Other Clinician: Referring [name]: Treating [name]/Extender: [name]., [name] in Treatment:  26 Encounter Discharge Information Items Discharge Condition: Stable Ambulatory Status: Ambulatory Discharge Destination: Home Transportation: Private Auto Accompanied By: self Schedule Follow-up Appointment: Yes Clinical Summary of Care: Patient Declined Electronic Signature(s) Signed: 07/05/2022 12:17:26 PM By: Fonnie Mu RN Entered By: Fonnie Mu on 07/05/2022 12:16:41 -------------------------------------------------------------------------------- Negative Pressure Wound Therapy Maintenance (NPWT) Details Patient Name: Date of Service: [name] 07/05/2022 11:30 A M Medical Record Number: [id number] Patient Account Number: [id number] Date of Birth/Sex: Treating RN: January 15, 1951 (72 y.o. [name], [name] Primary Care [name]: [name]., [name] Other Clinician: Referring [name]: Treating [name]/Extender: [name]., [name] in Treatment: 26 NPWT Maintenance Performed for: Wound #1 Sacrum Performed By: Fonnie Mu, RN Type: VAC System Coverage Size (sq cm): 0.08 Marissa, GAFFORD Cardenas ([id number]) [phone].pdf Page 2 of 3 Pressure Type: Constant Pressure Setting: 125 mmHG Drain Type: None Primary Contact: Non-Adherent Sponge/Dressing Type: Foam- Black Date Initiated: 06/05/2022 Dressing Removed: Yes Quantity of Sponges/Gauze Removed: 1 black foam Canister Changed: No Canister Exudate Volume: 15 Dressing Reapplied: Yes Quantity of Sponges/Gauze Inserted: 1 black foam Respones T Treatment: o tolerates well Days On NPWT : 31 Electronic Signature(s) Signed: 07/05/2022 12:17:26 PM By: Fonnie Mu RN Entered By: Fonnie Mu on 07/05/2022 12:16:07 -------------------------------------------------------------------------------- Patient/Caregiver Education Details Patient Name: Date of Service: [name] 4/26/2024andnbsp11:30 A M Medical Record Number: [id number] Patient Account  Number: [id number] Date of Birth/Gender: Treating RN: 1950-11-09 (72 y.o. [name], [name] Primary Care Physician: [name]., [name] Other Clinician: Referring Physician: Treating Physician/Extender: [name]., [name] in Treatment: 26 Education Assessment Education Provided To: Patient Education Topics Provided Wound/Skin Impairment: Methods: Explain/Verbal Responses: Reinforcements needed, State content correctly [employer]) Signed: 07/05/2022 12:17:26 PM By: Fonnie Mu RN Entered By: Fonnie Mu on 07/05/2022 12:16:26 -------------------------------------------------------------------------------- Wound Assessment Details Patient Name: Date of Service: Marissa Berthold Cardenas. 07/05/2022 11:30 A M Medical Record Number: [id number] Patient Account Number: [id number] Date of Birth/Sex: Treating RN: [birth year] (72 y.o. F) [name],  [name] Primary Care [name]: [name]., [name] Other Clinician: Referring [name]: Treating Mersedes Cardenas/Extender: [name]., [name] in Treatment: 26 Wound Status Wound Number: 1 Primary Etiology: Pressure Ulcer Wound Location: Sacrum Wound Status: Open Wounding Event: Pressure Injury Date Acquired: 10/09/2021 Weeks Of Treatment: 26 Clustered Wound: No Wound Measurements Length: (cm) 0.4 Width: (cm) 0.2 Depth: (cm) 1 Area: (cm) 0.06 Volume: (cm) 0.06 [place], [name] ([id number]) % Reduction in Area: 82.2% % Reduction in Volume: 74.5% 3 3 [id number] Page 3 of 3 Wound Description Classification: Category/Stage IV Exudate Amount: Medium Exudate Type: Serosanguineous Exudate Color: red, brown Periwound Skin Texture Texture Color No Abnormalities Noted: No No Abnormalities Noted: No Moisture No Abnormalities Noted: No Treatment Notes Wound #1 (Sacrum) Cleanser Vashe 5.8 (oz) Discharge Instruction: Cleanse the wound with Vashe prior to  applying a clean dressing using gauze sponges, not tissue or cotton balls. Peri-Wound Care Topical Primary Dressing Cutimed Sorbact Swab Discharge Instruction: lightly pack into wound bed, tunnel, and undermining. Then apply wound vac. wound vac Discharge Instruction: apply over the cutimed sorbact swab and bridge to hip. Secondary Dressing Secured With Compression Wrap Compression Stockings Add-Ons Electronic Signature(s) Signed: 07/05/2022 12:17:26 PM By: Fonnie Mu RN Entered By: Fonnie Mu on 07/05/2022 12:15:23 -------------------------------------------------------------------------------- Vitals Details Patient Name: Date of Service: Marissa Berthold Cardenas. 07/05/2022 11:30 A M Medical Record Number: [id number] Patient Account Number: [id number] Date of Birth/Sex: Treating RN: May 30, 1950 (72 y.o. [name], [name] Primary Care Phoenyx Melka: [name]., [name] Other Clinician: Referring Zachari Alberta: Treating Audray Rumore/Extender: Crissie Figures in Treatment: 26 Vital Signs Time Taken: 11:00 Reference Range: 80 - 120 mg / dl Height (in): 64 Weight (lbs): 97 Body Mass Index (BMI): 16.6 Electronic Signature(s) Signed: 07/05/2022 12:17:26 PM By: Fonnie Mu RN Entered By: Fonnie Mu on 07/05/2022 12:15:16

## 2022-07-08 ENCOUNTER — Encounter (HOSPITAL_BASED_OUTPATIENT_CLINIC_OR_DEPARTMENT_OTHER): Payer: 59 | Admitting: Internal Medicine

## 2022-07-08 DIAGNOSIS — L89154 Pressure ulcer of sacral region, stage 4: Secondary | ICD-10-CM | POA: Diagnosis not present

## 2022-07-08 DIAGNOSIS — L89153 Pressure ulcer of sacral region, stage 3: Secondary | ICD-10-CM | POA: Diagnosis not present

## 2022-07-08 DIAGNOSIS — M6281 Muscle weakness (generalized): Secondary | ICD-10-CM | POA: Diagnosis not present

## 2022-07-08 DIAGNOSIS — Z681 Body mass index (BMI) 19 or less, adult: Secondary | ICD-10-CM | POA: Diagnosis not present

## 2022-07-08 DIAGNOSIS — E43 Unspecified severe protein-calorie malnutrition: Secondary | ICD-10-CM | POA: Diagnosis not present

## 2022-07-08 NOTE — Progress Notes (Signed)
KERLINE, TRAHAN R ([id number]) [id number] Page 1 of 1 Visit Report for 07/05/2022 SuperBill Details Patient Name: Date of Service: [name] 07/05/2022 Medical Record Number: [id number] Patient Account Number: [id number] Date of Birth/Sex: Treating RN: May 01, 1950 (72 y.o. [name], [name] Primary Care Provider: Jayme Cloud., [name] Other Clinician: Referring Provider: Treating Provider/Extender: Crissie Figures in Treatment: 26 Diagnosis Coding ICD-10 Codes Code Description [phone] Pressure ulcer of sacral region, stage 4 M62.81 Muscle weakness (generalized) E43 Unspecified severe protein-calorie malnutrition Facility Procedures CPT4 Code Description Modifier Quantity [id number] [phone] - WOUND VAC-50 SQ CM OR LESS 1 Electronic Signature(s) Signed: 07/05/2022 12:17:26 PM By: Fonnie Mu RN Signed: 07/08/2022 10:32:07 AM By: Geralyn Corwin DO Entered By: Fonnie Mu on 07/05/2022 12:16:50

## 2022-07-09 NOTE — Progress Notes (Signed)
Marissa, FADDIS Cardenas ([id number]) [id number] Page 1 of 3 Visit Report for 07/08/2022 Arrival Information Details Patient Name: Date of Service: [name] 07/08/2022 7:50 A M Medical Record Number: [id number] Patient Account Number: [id number] Date of Birth/Sex: Treating RN: [birth year] (72 y.o. [name], [name] Primary Care Daryn Pisani: Jayme Cloud., [name] Other Clinician: Referring Zyairah Wacha: Treating Aniyha Tate/Extender: Crissie Figures in Treatment: 26 Visit Information History Since Last Visit Added or deleted any medications: No Patient Arrived: Ambulatory Any new allergies or adverse reactions: No Arrival Time: 07:55 Had a fall or experienced change in No Accompanied By: self activities of daily living that may affect Transfer Assistance: None risk of falls: Patient Identification Verified: Yes Signs or symptoms of abuse/neglect since last visito No Secondary Verification Process Completed: Yes Hospitalized since last visit: No Patient Requires Transmission-Based Precautions: No Implantable device outside of the clinic excluding No Patient Has Alerts: No cellular tissue based products placed in the center since last visit: Has Dressing in Place as Prescribed: Yes Pain Present Now: No Electronic Signature(s) Signed: 07/08/2022 5:00:20 PM By: Shawn Stall RN, BSN Entered By: Shawn Stall on 07/08/2022 08:33:02 -------------------------------------------------------------------------------- Encounter Discharge Information Details Patient Name: Date of Service: Marissa Berthold Cardenas. 07/08/2022 7:50 A M Medical Record Number: [id number] Patient Account Number: [id number] Date of Birth/Sex: Treating RN: [birth year] (72 y.o. [name], [name] Primary Care Mahmud Keithly: Jayme Cloud., [name] Other Clinician: Referring Lacreasha Hinds: Treating Safiyya Stokes/Extender: Allena Katz., [name] in Treatment: 26 Encounter Discharge  Information Items Discharge Condition: Stable Ambulatory Status: Ambulatory Discharge Destination: Home Transportation: Private Auto Accompanied By: self Schedule Follow-up Appointment: Yes Clinical Summary of Care: Electronic Signature(s) Signed: 07/08/2022 5:00:20 PM By: Shawn Stall RN, BSN Entered By: Shawn Stall on 07/08/2022 08:36:22 -------------------------------------------------------------------------------- Negative Pressure Wound Therapy Maintenance (NPWT) Details Patient Name: Date of Service: [name] 07/08/2022 7:50 A M Medical Record Number: [id number] Patient Account Number: [id number] Date of Birth/Sex: Treating RN: [birth year] (72 y.o. [name], [name] Primary Care Chasyn Cinque: Jayme Cloud., [name] Other Clinician: Referring Donovan Gatchel: Treating Christin Moline/Extender: Allena Katz., [name] in Treatment: 26 NPWT Maintenance Performed for: Wound #1 Sacrum Performed By: Shawn Stall, RN Type: VAC System Coverage Size (sq cm): 0.08 KAILY, WRAGG Cardenas ([id number]) [phone].pdf Page 2 of 3 Pressure Type: Constant Pressure Setting: 125 mmHG Drain Type: None Primary Contact: Non-Adherent Sponge/Dressing Type: Foam- Black Date Initiated: 06/05/2022 Dressing Removed: Yes Quantity of Sponges/Gauze Removed: x1 cutimed sorbact swab and x1 black foam Canister Changed: No Canister Exudate Volume: 0 Dressing Reapplied: Yes Quantity of Sponges/Gauze Inserted: x1 cutimed sorbact swab and x1 black foam Respones T Treatment: o tolerated well Days On NPWT : 34 Electronic Signature(s) Signed: 07/08/2022 5:00:20 PM By: Shawn Stall RN, BSN Entered By: Shawn Stall on 07/08/2022 08:36:01 -------------------------------------------------------------------------------- Wound Assessment Details Patient Name: Date of Service: [place] Marissa Goody Cardenas. 07/08/2022 7:50 A M Medical Record Number: [id number] Patient Account Number:  [id number] Date of Birth/Sex: Treating RN: [birth year] (72 y.o. [name], [name] Primary Care Ondrea Dow: Jayme Cloud., [name] Other Clinician: Referring Jesselyn Rask: Treating Tal Neer/Extender: Crissie Figures in Treatment: 26 Wound Status Wound Number: 1 Primary Etiology: Pressure Ulcer Wound Location: Sacrum Wound Status: Open Wounding Event: Pressure Injury Comorbid History: Colitis Date Acquired: 10/09/2021 Weeks Of Treatment: 26 Clustered Wound: No Wound Measurements Length: (cm) 0.4 Width: (cm) 0.2 Depth: (cm) 1 Area: (cm) 0.063 Volume: (cm) 0.063 % Reduction in Area: 82.2% % Reduction in Volume: 74.5% Epithelialization:  None Tunneling: No Undermining: No Wound Description Classification: Category/Stage IV Wound Margin: Well defined, not attached Exudate Amount: Medium Exudate Type: Serosanguineous Exudate Color: red, brown Foul Odor After Cleansing: No Slough/Fibrino No Wound Bed Granulation Amount: Large (67-100%) Exposed Structure Granulation Quality: Red Fascia Exposed: No Necrotic Amount: None Present (0%) Fat Layer (Subcutaneous Tissue) Exposed: Yes Tendon Exposed: No Muscle Exposed: No Joint Exposed: No Bone Exposed: No Periwound Skin Texture Texture Color No Abnormalities Noted: No No Abnormalities Noted: No Callus: No Atrophie Blanche: No Crepitus: No Cyanosis: No Excoriation: No Ecchymosis: No Induration: No Erythema: No Rash: No Hemosiderin Staining: No Scarring: No Mottled: No Pallor: No Moisture Rubor: No No Abnormalities Noted: No YANCY, HASCALL Cardenas ([id number]) [id number] Page 3 of 3 Dry / Scaly: No Maceration: No Treatment Notes Wound #1 (Sacrum) Cleanser Vashe 5.8 (oz) Discharge Instruction: Cleanse the wound with Vashe prior to applying a clean dressing using gauze sponges, not tissue or cotton balls. Peri-Wound Care Topical Primary Dressing Cutimed Sorbact Swab Discharge  Instruction: lightly pack into wound bed, tunnel, and undermining. Then apply wound vac. wound vac Discharge Instruction: apply over the cutimed sorbact swab and bridge to hip. Secondary Dressing Secured With Compression Wrap Compression Stockings Add-Ons Electronic Signature(s) Signed: 07/08/2022 5:00:20 PM By: Shawn Stall RN, BSN Entered By: Shawn Stall on 07/08/2022 08:34:22

## 2022-07-09 NOTE — Progress Notes (Signed)
JOSEY, DETTMANN R ([id number]) [id number] Page 1 of 1 Visit Report for 07/08/2022 SuperBill Details Patient Name: Date of Service: [name] 07/08/2022 Medical Record Number: [id number] Patient Account Number: [id number] Date of Birth/Sex: Treating RN: 01/21/1951 (72 y.o. [name], [name] Primary Care Provider: Jayme Cloud., [name] Other Clinician: Referring Provider: Treating Provider/Extender: Crissie Figures in Treatment: 26 Diagnosis Coding ICD-10 Codes Code Description [phone] Pressure ulcer of sacral region, stage 4 M62.81 Muscle weakness (generalized) E43 Unspecified severe protein-calorie malnutrition Facility Procedures CPT4 Code Description Modifier Quantity [id number] [phone] - WOUND VAC-50 SQ CM OR LESS 1 Electronic Signature(s) Signed: 07/08/2022 10:32:07 AM By: Geralyn Corwin DO Signed: 07/08/2022 5:00:20 PM By: Shawn Stall RN, BSN Entered By: Shawn Stall on 07/08/2022 08:36:27

## 2022-07-10 ENCOUNTER — Encounter (HOSPITAL_BASED_OUTPATIENT_CLINIC_OR_DEPARTMENT_OTHER): Payer: 59 | Attending: Physician Assistant | Admitting: Physician Assistant

## 2022-07-10 DIAGNOSIS — E43 Unspecified severe protein-calorie malnutrition: Secondary | ICD-10-CM | POA: Insufficient documentation

## 2022-07-10 DIAGNOSIS — M6281 Muscle weakness (generalized): Secondary | ICD-10-CM | POA: Diagnosis not present

## 2022-07-10 DIAGNOSIS — M8648 Chronic osteomyelitis with draining sinus, other site: Secondary | ICD-10-CM | POA: Insufficient documentation

## 2022-07-10 DIAGNOSIS — L89154 Pressure ulcer of sacral region, stage 4: Secondary | ICD-10-CM | POA: Insufficient documentation

## 2022-07-11 NOTE — Progress Notes (Addendum)
SKARLETH, DELMONICO Cardenas ([id number]) [id number] Page 1 of 7 Visit Report for 07/10/2022 Chief Complaint Document Details Patient Name: Date of Service: [name] 07/10/2022 1:15 PM Medical Record Number: [id number] Patient Account Number: [id number] Date of Birth/Sex: Treating RN: [birth year] (72 y.o. F) Primary Care Provider: Jayme Cloud., [name] Other Clinician: Referring Provider: Treating Provider/Extender: Crista Curb in Treatment: 27 Information Obtained from: Patient Chief Complaint Pressure ulcer stage 3 Sacrum Electronic Signature(s) Signed: 07/10/2022 1:42:03 PM By: Allen Derry PA-C Entered By: Allen Derry on 07/10/2022 13:42:03 -------------------------------------------------------------------------------- HPI Details Patient Name: Date of Service: CO Marissa Goody Cardenas. 07/10/2022 1:15 PM Medical Record Number: [id number] Patient Account Number: [id number] Date of Birth/Sex: Treating RN: [name] (72 y.o. F) Primary Care Provider: Jayme Cloud., [name] Other Clinician: Referring Provider: Treating Provider/Extender: Crista Curb in Treatment: 27 History of Present Illness HPI Description: 01-02-22 upon evaluation today patient presents for initial inspection here in our clinic concerning a wound that she has over the sacral region. This is stated to be present since around the beginning of August 2023. She currently resides in a assisted nursing facility. She does seem to be able to answer questions fully today. Subsequently I do note that she is a little on the thin side but again other than the protein calorie malnutrition she is minimally weak but still does get up and move around some but she also tells me that she "sits a lot". She has not had any x-rays of the sacral region at this point. 01-09-2022 upon evaluation today patient's wound in the sacral area actually appears to be doing decently  well. Fortunately I do not see any signs of infection which is great news and overall I am extremely pleased with where things stand today. 11/8; this is a patient who lives in some form of small assisted living in [place]. She has a deep wound over the lower part of her coccyx. An x-ray that we ordered from last time did not show any osseous abnormalities. We are supposed to be using Hydrofera Blue in the wound but I am really not sure what they are using to dress this and who is doing it. Talking to our staff they have apparently discussed this with the staff in the facility. She does not have home health. 01-23-2022 upon evaluation today patient appears to be doing decently well in regard to her wound. She has been tolerating the dressing changes without complication although I am not certain the dressing changes have been done appropriately over the past couple of weeks. We have been trying to get her to come in here we also try to get her into a wound care center in [place] which would be closer for the assisted living facility. Unfortunately neither 1 of those were undertaken up to this point. The patient did end up having some training of the staff from an RN that they brought him to have the staff perform the dressing changes but that being said it does not sound like this has been done correctly. Nonetheless I do believe that based on what we see it would benefit the patient to actually have her come here 3 times a week also think a wound VAC could potentially be a possibility for her which would help to get things moving in a much better direction as far as healing is concerned. We need to get this to fill-in though it looks clean and does not  look infected I do think that we need to really get this to fill-in and there is quite a bit of undermining that is to be considered here. 01-30-2022 upon evaluation today patient appears to be doing well currently in regard to her wound which is  looking pretty decent but still has quite a bit of space underneath as far as undermining is concerned. I think she might possibly be better with a wound VAC. With that being said if when I do this we probably only be able to do it 2 times a week at most. I discussed that with the patient today she is okay with that we just need to see if we can get the insurance approval and then of course the scheduling underway. 02-13-2022 upon evaluation today patient appears to be doing well currently in regard to her wound. She actually tells me that it is feeling a lot better which is good news. Fortunately I do not see any signs of active infection at this time. No fevers, chills, nausea, vomiting, or diarrhea. 02-27-2022 upon evaluation today patient appears to be doing well currently in regard to her wound. She is actually showing some signs of improvement we are still obtaining the approval for the snap VAC which I think could be beneficial in the meantime we been using the Hydrofera Blue rope. 03-27-2022 upon evaluation today patient appears to be doing okay in regard to her wound but there was no dressing in place upon arrival today. Fortunately I see again no evidence of infection. No fevers, chills, nausea, vomiting, or diarrhea. 04-03-2022 upon evaluation today patient did not have a dressing on when she came into the office. With that being said she actually tells me that it was on until yesterday she took a shower and it came loose and then today came off completely. With that being said I really do not think she needs to be taking shower every single day or having to see her here in the clinic 3 times per week on Monday, Wednesday, and Friday and during those times obviously in between she needs to probably avoid showering. For that reason what I advised her to do would be to shower in the mornings on the days that she comes to see [place] that way if the dressing does come off or start to come off we will  be changing it anyway. She voiced understanding and tells me she can do that. Unfortunately the facility has nobody to help her with changing the dressings and we are not currently having to do that here in the clinic again. This means [name], [name] ([id number]) [id number] Page 2 of 7 that during the time that she was not coming in from December 20 through when I saw her last week on the 17th there was a period of 2 weeks where the wound really was not changed at all as the RN that Baird Lyons tell me they had at the facility did not end up working out. This obviously is not good and is not what we expect to see as far as patient care is concerned which is why we are now seeing her 3 times a week here in the clinic. 04-10-2022 upon evaluation today patient appears to be doing well currently in regard to her wound which is actually showing signs of good granulation epithelization at this point. However she does have quite a bit of drainage and we are little concerned about the possibility of infection. For that reason  I think she could benefit from a PCR culture followed by [facility] topical antibiotics. I am also thinking of going ahead and doing gentamicin today as well. 2/7; small wound on the lower sacrum however with considerable degree of undermining from roughly 7-4. PCR culture that was done last week showed "no organisms". We have been using gentamicin and iodoform packing.She lives in some form of group home in [place] I believe. Uncertain about the adequacy of offloading this area 04-24-2022 upon evaluation patient's wound actually is showing signs of doing decently well I do believe she would benefit from a snap VAC and we discussed that again today. Fortunately I do not see any signs of active infection locally nor systemically which is great news. No fevers, chills, nausea, vomiting, or diarrhea. 2-Cardenas-2024 upon evaluation today patient's wound is really doing  about the same. Fortunately I do not see any signs of active infection locally nor systemically at this time which is great news with that being said we have gotten approval for the snap VAC and we will going to go ahead and place that today. 05-08-2022 upon evaluation today patient appears to be doing well currently in regard to her wound all things considered but were having a difficult time with a snap VAC suctioning appropriately. With that being said we will get a go ahead and likely avoid the snap VAC at this point at least until we can get the actual brace dressing as bridging ourselves does not seem to be working nearly as well. 3/6; I note the difficulty with a snap VAC which is disappointing but apparently another type of VAC is being considered. Also a referral to plastic surgery at [facility]. The patient has a very small but deep and at the base circumferential undermining. She is coming in here now 3 times a week for [facility] placement 3/13; patient presents for follow-up. She has been using Hydrofera Blue 3 times a week. She comes into our clinic for nurse visits to have this done. We are still awaiting a wound VAC. She has no issues or complaints today. 05-29-2022 upon evaluation today patient appears to be doing about the same in regard to her wound. She still has significant undermining although the opening is a huge the undermining is significant. I do believe that a gauze VAC would be beneficial here. The patient does not eat normally and in fact tells me that she eats very well. She also is doing supplements with Juven at this point. She also has a good ability to get up and move around she is completely ambulatory and does not just lay with pressure on this area. She also is able to roll and reposition in bed without complication. For that reason she really has no need for a group 2 mattress at this point whatsoever this would be a complete waste of resources as far as insurance  and medical equipment is concerned. As far as her urinary incontinence she has no issues with incontinence at this point she is able to carry herself to the bathroom when needed she does wear a depends just in case but again that is not a routinely utilized item. 06-05-2022 upon evaluation today patient appears to be doing decently well in regard to her wound I do not see any signs of infection overall she seems to be doing quite well. I do think that she is making good progress in general here. We do have the wound VAC however and I think this is  going to potentially help to get this. Hand has been the biggest issue we have had is getting this to actually feeling more effectively. 06-12-2022 upon evaluation today patient appears to be doing okay in regard to her wound. We are still seeing some signs of improvement. Fortunately there does not appear to be any evidence of active infection at this time which is good news she did see the doctors over at [facility] yesterday and they are ordering an MRI in preparation for considering possibility of a surgical closure. The patient voiced understanding she had forgotten we have made this referral. T be o honest I was back in February kind of forgot about the referral to in the interim trying to proceed with her wound care. Nonetheless I appreciate their be evaluation of this and again we will see what the MRI shows. 06-19-2022 upon evaluation today patient's wound is actually showing signs of good improvement and very pleased with where we stand today. Fortunately I do not see any evidence of infection locally nor systemically which is great news I think the wound VAC is doing a good job of undermining seems less than last week. 06-26-2022 upon evaluation today patient appears to be doing well with regard to her wound although her wound VAC it actually died she had forgotten to charge it overnight. With that being said she does seem to be doing decently well at  this point based on what I am seeing I think that the undermining is much less than what it was previous. Fortunately I do not see any signs of active infection locally nor systemically at this time. 07-03-2022 upon evaluation today patient appears to be doing decently well in regard to her wound. She actually did have an MRI performed and I did review that today as well. It shows that she actually does have a focal marrow signal abnormality in the left aspect of the sacrum at the S4 level with adjacent soft tissue swelling and presumed sacral decubitus ulcer concerning for a small area of osteomyelitis. Otherwise there did not appear to be any fluid collection which is good news there is no signs of an obvious abscess. 07-10-2022 upon evaluation today patient appears to be doing well currently in regard to her wound. She has been tolerating the dressing changes without complication. Fortunately there does not appear to be any signs of active infection which is good news. Electronic Signature(s) Signed: 07/10/2022 1:49:27 PM By: Allen Derry PA-C Entered By: Allen Derry on 07/10/2022 13:49:27 -------------------------------------------------------------------------------- Physical Exam Details Patient Name: Date of Service: [name] 07/10/2022 1:15 PM Medical Record Number: [id number] Patient Account Number: [id number] Date of Birth/Sex: Treating RN: [name], [name] (72 y.o. F) Primary Care Provider: Jayme Cloud., [name] Other Clinician: Referring Provider: Treating Provider/Extender: Crista Curb in Treatment: 27 Constitutional Well-nourished and well-hydrated in no acute distress. Respiratory normal breathing without difficulty. [name], [name] ([id number]) [id number] Page 3 of 7 Psychiatric this patient is able to make decisions and demonstrates good insight into disease process. Alert and Oriented x 3. pleasant and  cooperative. Notes Upon inspection patient's wound bed showed signs of good granulation epithelization her wound is still open enough to be able to get a wound VAC and this is good news. Fortunately I do not see any signs of infection locally or systemically clinically which is also good news. Electronic Signature(s) Signed: 07/10/2022 1:53:10 PM By: Allen Derry PA-C Entered By: Allen Derry on 07/10/2022 13:53:10 -------------------------------------------------------------------------------- Physician  Orders Details Patient Name: Date of Service: [name] 07/10/2022 1:15 PM Medical Record Number: [id number] Patient Account Number: [id number] Date of Birth/Sex: Treating RN: [birth year][name] (72 y.o. [name], [name] Primary Care Provider: Jayme Cloud., [name] Other Clinician: Referring Provider: Treating Provider/Extender: Crista Curb in Treatment: 27 Verbal / Phone Orders: No Diagnosis Coding ICD-10 Coding Code Description L89.154 Pressure ulcer of sacral region, stage 4 M62.81 Muscle weakness (generalized) E43 Unspecified severe protein-calorie malnutrition Follow-up Appointments ppointment in 1 week. Leonard Schwartz Wednesday 1100 room 8 07/17/2022 (already has appt.) Return A Nurse Visit: - This Friday 07/12/22 @ 11:15 (already has appt.) Monday 07/15/22 @ 3:30 and Friday 07/19/22 @ 11:00 Other: - Follow up with [facility] on 07/16/2022. Had bloodwork done today 07/10/22- not in epic yet. Anesthetic (In clinic) Topical Lidocaine 4% applied to wound bed Negative Presssure Wound Therapy Wound Vac to wound continuously at 117mm/hg pressure Black Foam - ****USE CUTIMED SORBACT SWAB AS PACKING INTO WOUND BED THEN BRIDGE WITH BLACK FOAM TO HIP.**** Off-Loading Turn and reposition every 2 hours - when sitting. walk hourly to aid in offloading pressure to buttock. Wound Treatment Wound #1 - Sacrum Cleanser: Vashe 5.8 (oz) 3 x Per Week Discharge  Instructions: Cleanse the wound with Vashe prior to applying a clean dressing using gauze sponges, not tissue or cotton balls. Prim Dressing: Cutimed Sorbact Swab 3 x Per Week ary Discharge Instructions: lightly pack into wound bed, tunnel, and undermining. Then apply wound vac. Prim Dressing: wound vac 3 x Per Week ary Discharge Instructions: apply over the cutimed sorbact swab and bridge to hip. Electronic Signature(s) Signed: 07/10/2022 4:25:39 PM By: Fonnie Mu RN Signed: 07/10/2022 5:01:35 PM By: Allen Derry PA-C Entered By: Fonnie Mu on 07/10/2022 13:51:12 -------------------------------------------------------------------------------- Problem List Details Patient Name: Date of Service: CO Marissa Goody Cardenas. 07/10/2022 1:15 PM Medical Record Number: [id number] Patient Account Number: [id number] [name], [name] ([id number]) [phone].pdf Page 4 of 7 Date of Birth/Sex: Treating RN: [name]-[birth year] (72 y.o. F) Primary Care Provider: Other Clinician: Jayme Cloud., [name] Referring Provider: Treating Provider/Extender: Crista Curb in Treatment: 27 Active Problems ICD-10 Encounter Code Description Active Date MDM Diagnosis L89.154 Pressure ulcer of sacral region, stage 4 01/02/2022 No Yes M62.81 Muscle weakness (generalized) 01/02/2022 No Yes E43 Unspecified severe protein-calorie malnutrition 01/02/2022 No Yes Inactive Problems Resolved Problems Electronic Signature(s) Signed: 07/10/2022 1:41:52 PM By: Allen Derry PA-C Entered By: Allen Derry on 07/10/2022 13:41:52 -------------------------------------------------------------------------------- Progress Note Details Patient Name: Date of Service: CO Marissa Goody Cardenas. 07/10/2022 1:15 PM Medical Record Number: [id number] Patient Account Number: [id number] Date of Birth/Sex: Treating RN: [birth year][name] (72 y.o. F) Primary Care Provider: Jayme Cloud., [name] Other  Clinician: Referring Provider: Treating Provider/Extender: Crista Curb in Treatment: 27 Subjective Chief Complaint Information obtained from Patient Pressure ulcer stage 3 Sacrum History of Present Illness (HPI) 01-02-22 upon evaluation today patient presents for initial inspection here in our clinic concerning a wound that she has over the sacral region. This is stated to be present since around the beginning of August 2023. She currently resides in a assisted nursing facility. She does seem to be able to answer questions fully today. Subsequently I do note that she is a little on the thin side but again other than the protein calorie malnutrition she is minimally weak but still does get up and move around some but she also tells me that she "sits  a lot". She has not had any x-rays of the sacral region at this point. 01-09-2022 upon evaluation today patient's wound in the sacral area actually appears to be doing decently well. Fortunately I do not see any signs of infection which is great news and overall I am extremely pleased with where things stand today. 11/8; this is a patient who lives in some form of small assisted living in [place]. She has a deep wound over the lower part of her coccyx. An x-ray that we ordered from last time did not show any osseous abnormalities. We are supposed to be using Hydrofera Blue in the wound but I am really not sure what they are using to dress this and who is doing it. Talking to our staff they have apparently discussed this with the staff in the facility. She does not have home health. 01-23-2022 upon evaluation today patient appears to be doing decently well in regard to her wound. She has been tolerating the dressing changes without complication although I am not certain the dressing changes have been done appropriately over the past couple of weeks. We have been trying to get her to come in here we also try to get her  into a wound care center in [place] which would be closer for the assisted living facility. Unfortunately neither 1 of those were undertaken up to this point. The patient did end up having some training of the staff from an RN that they brought him to have the staff perform the dressing changes but that being said it does not sound like this has been done correctly. Nonetheless I do believe that based on what we see it would benefit the patient to actually have her come here 3 times a week also think a wound VAC could potentially be a possibility for her which would help to get things moving in a much better direction as far as healing is concerned. We need to get this to fill-in though it looks clean and does not look infected I do think that we need to really get this to fill-in and there is quite a bit of undermining that is to be considered here. 01-30-2022 upon evaluation today patient appears to be doing well currently in regard to her wound which is looking pretty decent but still has quite a bit of space underneath as far as undermining is concerned. I think she might possibly be better with a wound VAC. With that being said if when I do this we probably only be able to do it 2 times a week at most. I discussed that with the patient today she is okay with that we just need to see if we can get the insurance approval and then of course the scheduling underway. 02-13-2022 upon evaluation today patient appears to be doing well currently in regard to her wound. She actually tells me that it is feeling a lot better which is [name], Marissa ([id number]) [id number] Page 5 of 7 good news. Fortunately I do not see any signs of active infection at this time. No fevers, chills, nausea, vomiting, or diarrhea. 02-27-2022 upon evaluation today patient appears to be doing well currently in regard to her wound. She is actually showing some signs of improvement we are still obtaining  the approval for the snap VAC which I think could be beneficial in the meantime we been using the Hydrofera Blue rope. 03-27-2022 upon evaluation today patient appears to be doing okay in regard to her wound  but there was no dressing in place upon arrival today. Fortunately I see again no evidence of infection. No fevers, chills, nausea, vomiting, or diarrhea. 04-03-2022 upon evaluation today patient did not have a dressing on when she came into the office. With that being said she actually tells me that it was on until yesterday she took a shower and it came loose and then today came off completely. With that being said I really do not think she needs to be taking shower every single day or having to see her here in the clinic 3 times per week on Monday, Wednesday, and Friday and during those times obviously in between she needs to probably avoid showering. For that reason what I advised her to do would be to shower in the mornings on the days that she comes to see [place] that way if the dressing does come off or start to come off we will be changing it anyway. She voiced understanding and tells me she can do that. Unfortunately the facility has nobody to help her with changing the dressings and we are not currently having to do that here in the clinic again. This means that during the time that she was not coming in from December 20 through when I saw her last week on the 17th there was a period of 2 weeks where the wound really was not changed at all as the RN that Baird Lyons tell me they had at the facility did not end up working out. This obviously is not good and is not what we expect to see as far as patient care is concerned which is why we are now seeing her 3 times a week here in the clinic. 04-10-2022 upon evaluation today patient appears to be doing well currently in regard to her wound which is actually showing signs of good granulation epithelization at this point. However she does have quite a bit of  drainage and we are little concerned about the possibility of infection. For that reason I think she could benefit from a PCR culture followed by [facility] topical antibiotics. I am also thinking of going ahead and doing gentamicin today as well. 2/7; small wound on the lower sacrum however with considerable degree of undermining from roughly 7-4. PCR culture that was done last week showed "no organisms". We have been using gentamicin and iodoform packing.She lives in some form of group home in [place] I believe. Uncertain about the adequacy of offloading this area 04-24-2022 upon evaluation patient's wound actually is showing signs of doing decently well I do believe she would benefit from a snap VAC and we discussed that again today. Fortunately I do not see any signs of active infection locally nor systemically which is great news. No fevers, chills, nausea, vomiting, or diarrhea. 2-Cardenas-2024 upon evaluation today patient's wound is really doing about the same. Fortunately I do not see any signs of active infection locally nor systemically at this time which is great news with that being said we have gotten approval for the snap VAC and we will going to go ahead and place that today. 05-08-2022 upon evaluation today patient appears to be doing well currently in regard to her wound all things considered but were having a difficult time with a snap VAC suctioning appropriately. With that being said we will get a go ahead and likely avoid the snap VAC at this point at least until we can get the actual brace dressing as bridging ourselves does not seem to  be working nearly as well. 3/6; I note the difficulty with a snap VAC which is disappointing but apparently another type of VAC is being considered. Also a referral to plastic surgery at [facility]. The patient has a very small but deep and at the base circumferential undermining. She is coming in here now 3 times a week for [facility] placement 3/13;  patient presents for follow-up. She has been using Hydrofera Blue 3 times a week. She comes into our clinic for nurse visits to have this done. We are still awaiting a wound VAC. She has no issues or complaints today. 05-29-2022 upon evaluation today patient appears to be doing about the same in regard to her wound. She still has significant undermining although the opening is a huge the undermining is significant. I do believe that a gauze VAC would be beneficial here. The patient does not eat normally and in fact tells me that she eats very well. She also is doing supplements with Juven at this point. She also has a good ability to get up and move around she is completely ambulatory and does not just lay with pressure on this area. She also is able to roll and reposition in bed without complication. For that reason she really has no need for a group 2 mattress at this point whatsoever this would be a complete waste of resources as far as insurance and medical equipment is concerned. As far as her urinary incontinence she has no issues with incontinence at this point she is able to carry herself to the bathroom when needed she does wear a depends just in case but again that is not a routinely utilized item. 06-05-2022 upon evaluation today patient appears to be doing decently well in regard to her wound I do not see any signs of infection overall she seems to be doing quite well. I do think that she is making good progress in general here. We do have the wound VAC however and I think this is going to potentially help to get this. Hand has been the biggest issue we have had is getting this to actually feeling more effectively. 06-12-2022 upon evaluation today patient appears to be doing okay in regard to her wound. We are still seeing some signs of improvement. Fortunately there does not appear to be any evidence of active infection at this time which is good news she did see the doctors over at [facility]  yesterday and they are ordering an MRI in preparation for considering possibility of a surgical closure. The patient voiced understanding she had forgotten we have made this referral. T be o honest I was back in February kind of forgot about the referral to in the interim trying to proceed with her wound care. Nonetheless I appreciate their be evaluation of this and again we will see what the MRI shows. 06-19-2022 upon evaluation today patient's wound is actually showing signs of good improvement and very pleased with where we stand today. Fortunately I do not see any evidence of infection locally nor systemically which is great news I think the wound VAC is doing a good job of undermining seems less than last week. 06-26-2022 upon evaluation today patient appears to be doing well with regard to her wound although her wound VAC it actually died she had forgotten to charge it overnight. With that being said she does seem to be doing decently well at this point based on what I am seeing I think that the undermining is  much less than what it was previous. Fortunately I do not see any signs of active infection locally nor systemically at this time. 07-03-2022 upon evaluation today patient appears to be doing decently well in regard to her wound. She actually did have an MRI performed and I did review that today as well. It shows that she actually does have a focal marrow signal abnormality in the left aspect of the sacrum at the S4 level with adjacent soft tissue swelling and presumed sacral decubitus ulcer concerning for a small area of osteomyelitis. Otherwise there did not appear to be any fluid collection which is good news there is no signs of an obvious abscess. 07-10-2022 upon evaluation today patient appears to be doing well currently in regard to her wound. She has been tolerating the dressing changes without complication. Fortunately there does not appear to be any signs of active infection which is  good news. Objective Constitutional Well-nourished and well-hydrated in no acute distress. [name], [name] ([id number]) [id number] Page 6 of 7 Vitals Time Taken: 1:32 PM, Height: 64 in, Weight: 97 lbs, BMI: 16.6, Temperature: 98.3 F, Pulse: 63 bpm, Respiratory Rate: 18 breaths/min, Blood Pressure: 118/72 mmHg. Respiratory normal breathing without difficulty. Psychiatric this patient is able to make decisions and demonstrates good insight into disease process. Alert and Oriented x 3. pleasant and cooperative. General Notes: Upon inspection patient's wound bed showed signs of good granulation epithelization her wound is still open enough to be able to get a wound VAC and this is good news. Fortunately I do not see any signs of infection locally or systemically clinically which is also good news. Integumentary (Hair, Skin) Wound #1 status is Open. Original cause of wound was Pressure Injury. The date acquired was: 10/09/2021. The wound has been in treatment 27 weeks. The wound is located on the Sacrum. The wound measures 0.4cm length x 0.4cm width x 1.3cm depth; 0.126cm^2 area and 0.163cm^3 volume. There is Fat Layer (Subcutaneous Tissue) exposed. There is a medium amount of serosanguineous drainage noted. The wound margin is well defined and not attached to the wound base. There is large (67-100%) red granulation within the wound bed. There is no necrotic tissue within the wound bed. The periwound skin appearance did not exhibit: Callus, Crepitus, Excoriation, Induration, Rash, Scarring, Dry/Scaly, Maceration, Atrophie Blanche, Cyanosis, Ecchymosis, Hemosiderin Staining, Mottled, Pallor, Rubor, Erythema. Assessment Active Problems ICD-10 Pressure ulcer of sacral region, stage 4 Muscle weakness (generalized) Unspecified severe protein-calorie malnutrition Plan Follow-up Appointments: Return Appointment in 1 week. Leonard Schwartz Wednesday 1100 room 8 07/17/2022 (already  has appt.) Nurse Visit: - This Friday 07/12/22 @ 11:15 (already has appt.) Monday 07/15/22 @ 3:30 and Friday 07/19/22 @ 11:00 Other: - Follow up with [facility] on 07/16/2022. Had bloodwork done today 07/10/22- not in epic yet. Anesthetic: (In clinic) Topical Lidocaine 4% applied to wound bed Negative Presssure Wound Therapy: Wound Vac to wound continuously at 158mm/hg pressure Black Foam - ****USE CUTIMED SORBACT SWAB AS PACKING INTO WOUND BED THEN BRIDGE WITH BLACK FOAM TO HIP.**** Off-Loading: Turn and reposition every 2 hours - when sitting. walk hourly to aid in offloading pressure to buttock. WOUND #1: - Sacrum Wound Laterality: Cleanser: Vashe 5.8 (oz) 3 x Per Week/ Discharge Instructions: Cleanse the wound with Vashe prior to applying a clean dressing using gauze sponges, not tissue or cotton balls. Prim Dressing: Cutimed Sorbact Swab 3 x Per Week/ ary Discharge Instructions: lightly pack into wound bed, tunnel, and undermining. Then apply wound vac. Prim  Dressing: wound vac 3 x Per Week/ ary Discharge Instructions: apply over the cutimed sorbact swab and bridge to hip. 1. Based on what I am seeing I do believe that the patient would benefit from a continuation of therapy with the gauze VAC for the time being. Still I think that she does have the appointment with plastic surgery next week we will see what they have to say as well. 2. She did get her blood work this week but I do not have that available for review as it was just done this morning unfortunately. 3. I am also can recommend that she should continue to offload is much as possible keeping pressure off the sacral area is good to be of great benefit. We will see patient back for reevaluation in 1 week here in the clinic. If anything worsens or changes patient will contact our office for additional recommendations. Electronic Signature(s) Signed: 07/10/2022 1:53:59 PM By: Allen Derry PA-C Entered By: Allen Derry on  07/10/2022 13:53:58 -------------------------------------------------------------------------------- SuperBill Details Patient Name: Date of Service: CO Marissa Goody Cardenas. 07/10/2022 Medical Record Number: [id number] Patient Account Number: [id number] Date of Birth/Sex: Treating RN: [name][birth year] (72 y.o. F) Primary Care Provider: Jayme Cloud., [name] Other Clinician: Referring Provider: Treating Provider/Extender: Marcelino Scot [place], [place] Cardenas ([id number]) [phone].pdf Page 7 of 7 Weeks in Treatment: 27 Diagnosis Coding ICD-10 Codes Code Description L89.154 Pressure ulcer of sacral region, stage 4 M62.81 Muscle weakness (generalized) E43 Unspecified severe protein-calorie malnutrition Facility Procedures : CPT4 Code: 02725366 Description: 97605 - WOUND VAC-50 SQ CM OR LESS Modifier: Quantity: 1 Physician Procedures : CPT4 Code Description Modifier [id number] - WC PHYS LEVEL 4 - EST PT ICD-10 Diagnosis Description L89.154 Pressure ulcer of sacral region, stage 4 M62.81 Muscle weakness (generalized) E43 Unspecified severe protein-calorie malnutrition Quantity: 1 Electronic Signature(s) Signed: 07/10/2022 4:25:39 PM By: Fonnie Mu RN Signed: 07/10/2022 5:01:35 PM By: Allen Derry PA-C Previous Signature: 07/10/2022 1:54:20 PM Version By: Allen Derry PA-C Entered By: Fonnie Mu on 07/10/2022 13:55:41

## 2022-07-12 ENCOUNTER — Encounter (HOSPITAL_BASED_OUTPATIENT_CLINIC_OR_DEPARTMENT_OTHER): Payer: 59 | Admitting: Internal Medicine

## 2022-07-12 DIAGNOSIS — L89154 Pressure ulcer of sacral region, stage 4: Secondary | ICD-10-CM | POA: Diagnosis not present

## 2022-07-12 DIAGNOSIS — M8648 Chronic osteomyelitis with draining sinus, other site: Secondary | ICD-10-CM | POA: Diagnosis not present

## 2022-07-12 DIAGNOSIS — M6281 Muscle weakness (generalized): Secondary | ICD-10-CM | POA: Diagnosis not present

## 2022-07-12 DIAGNOSIS — E43 Unspecified severe protein-calorie malnutrition: Secondary | ICD-10-CM | POA: Diagnosis not present

## 2022-07-12 NOTE — Progress Notes (Signed)
Marissa, Cardenas R ([id number]) [id number] Page 1 of 6 Visit Report for 07/10/2022 Arrival Information Details Patient Name: Date of Service: [name] 07/10/2022 1:15 PM Medical Record Number: [id number] Patient Account Number: [id number] Date of Birth/Sex: Treating RN: February 13, 1951 (72 y.o. F) Primary Care Mariyam Remington: Jayme Cloud., [name] Other Clinician: Referring Masashi Snowdon: Treating Mearl Harewood/Extender: Crista Curb in Treatment: 27 Visit Information History Since Last Visit Added or deleted any medications: No Patient Arrived: Ambulatory Any new allergies or adverse reactions: No Arrival Time: 13:31 Had a fall or experienced change in No Accompanied By: self activities of daily living that may affect Transfer Assistance: None risk of falls: Patient Identification Verified: Yes Signs or symptoms of abuse/neglect since last visito No Secondary Verification Process Completed: Yes Hospitalized since last visit: No Patient Requires Transmission-Based Precautions: No Implantable device outside of the clinic excluding No Patient Has Alerts: No cellular tissue based products placed in the center since last visit: Has Dressing in Place as Prescribed: Yes Pain Present Now: No Electronic Signature(s) Signed: 07/11/2022 2:39:11 PM By: Karl Ito Entered By: Karl Ito on 07/10/2022 13:32:08 -------------------------------------------------------------------------------- Encounter Discharge Information Details Patient Name: Date of Service: [name] 07/10/2022 1:15 PM Medical Record Number: [id number] Patient Account Number: [id number] Date of Birth/Sex: Treating RN: [birth year] (72 y.o. [name], [name] Primary Care Tenishia Ekman: Jayme Cloud., [name] Other Clinician: Referring Romy Ipock: Treating Martell Mcfadyen/Extender: Crista Curb in Treatment: 27 Encounter Discharge Information  Items Discharge Condition: Stable Ambulatory Status: Ambulatory Discharge Destination: Home Transportation: Private Auto Accompanied By: self Schedule Follow-up Appointment: Yes Clinical Summary of Care: Patient Declined Electronic Signature(s) Signed: 07/10/2022 4:25:39 PM By: Fonnie Mu RN Entered By: Fonnie Mu on 07/10/2022 13:56:06 -------------------------------------------------------------------------------- Lower Extremity Assessment Details Patient Name: Date of Service: [name] 07/10/2022 1:15 PM Medical Record Number: [id number] Patient Account Number: [id number] Date of Birth/Sex: Treating RN: 1950/05/13 (72 y.o. [name], [name] Primary Care Adonis Yim: Jayme Cloud., [name] Other Clinician: Referring Cesia Orf: Treating Bookert Guzzi/Extender: Crista Curb in Treatment: 27 Electronic Signature(s) Signed: 07/10/2022 4:25:39 PM By: Fonnie Mu RN [name] ([id number]) [id number] Page 2 of 6 Entered By: Fonnie Mu on 07/10/2022 13:42:20 -------------------------------------------------------------------------------- Multi-Disciplinary Care Plan Details Patient Name: Date of Service: [name] 07/10/2022 1:15 PM Medical Record Number: [id number] Patient Account Number: [id number] Date of Birth/Sex: Treating RN: [birth year] (72 y.o. [name], [name] Primary Care Jaida Basurto: Jayme Cloud., [name] Other Clinician: Referring Desiree Daise: Treating Ryland Smoots/Extender: Crista Curb in Treatment: 27 Active Inactive Osteomyelitis Nursing Diagnoses: Infection: osteomyelitis Knowledge deficit related to disease process and management Goals: Diagnostic evaluation for osteomyelitis completed as ordered Date Initiated: 07/03/2022 Target Resolution Date: 08/10/2022 Goal Status: Active Patient/caregiver will verbalize understanding of disease process and  disease management Date Initiated: 07/03/2022 Target Resolution Date: 07/19/2022 Goal Status: Active Interventions: Assess for signs and symptoms of osteomyelitis resolution every visit Provide education on osteomyelitis Screen for HBO Treatment Activities: Consult for HBO : 07/03/2022 MRI : 06/26/2022 Systemic antibiotics : 07/03/2022 White Blood Cell Scan : 07/03/2022 Notes: Pain, Acute or Chronic Nursing Diagnoses: Pain, acute or chronic: actual or potential Potential alteration in comfort, pain Goals: Patient will verbalize adequate pain control and receive pain control interventions during procedures as needed Date Initiated: 01/02/2022 Target Resolution Date: 07/13/2022 Goal Status: Active Patient/caregiver will verbalize adequate pain control between visits Date Initiated: 01/02/2022 Target Resolution Date: 07/13/2022 Goal Status: Active  Interventions: Encourage patient to take pain medications as prescribed Provide education on pain management Reposition patient for comfort Treatment Activities: Administer pain control measures as ordered : 01/02/2022 Notes: Pressure Nursing Diagnoses: Knowledge deficit related to management of pressures ulcers Goals: Patient/caregiver will verbalize risk factors for pressure ulcer development Date Initiated: 01/02/2022 Target Resolution Date: 07/13/2022 SALSABEEL, KINKADE ([id number]) [phone].pdf Page 3 of 6 Goal Status: Active Interventions: Assess: immobility, friction, shearing, incontinence upon admission and as needed Assess offloading mechanisms upon admission and as needed Provide education on pressure ulcers Treatment Activities: T ordered outside of clinic : 01/02/2022 est Notes: Electronic Signature(s) Signed: 07/10/2022 4:25:39 PM By: Fonnie Mu RN Entered By: Fonnie Mu on 07/10/2022 13:44:02 -------------------------------------------------------------------------------- Negative  Pressure Wound Therapy Maintenance (NPWT) Details Patient Name: Date of ServiceEarlie Cardenas 07/10/2022 1:15 PM Medical Record Number: [id number] Patient Account Number: [id number] Date of Birth/Sex: Treating RN: [birth year] (72 y.o. [name], [name] Primary Care Mazal Ebey: Jayme Cloud., [name] Other Clinician: Referring Moana Munford: Treating Thamar Holik/Extender: Crista Curb in Treatment: 27 NPWT Maintenance Performed for: Wound #1 Sacrum Performed By: Fonnie Mu, RN Type: VAC System Coverage Size (sq cm): 0.16 Pressure Type: Constant Pressure Setting: 125 mmHG Drain Type: None Primary Contact: Non-Adherent Sponge/Dressing Type: Foam- Black Date Initiated: 06/05/2022 Dressing Removed: Yes Quantity of Sponges/Gauze Removed: x1 black foam in wound and bridge Canister Changed: Yes Canister Exudate Volume: 25 Dressing Reapplied: Yes Quantity of Sponges/Gauze Inserted: x1 black foam bridge to hip. Respones T Treatment: o tolerated well Days On NPWT : 36 Post Procedure Diagnosis Same as Pre-procedure Electronic Signature(s) Signed: 07/10/2022 4:25:39 PM By: Fonnie Mu RN Entered By: Fonnie Mu on 07/10/2022 13:52:06 -------------------------------------------------------------------------------- Pain Assessment Details Patient Name: Date of Service: [name] 07/10/2022 1:15 PM Medical Record Number: [id number] Patient Account Number: [id number] Date of Birth/Sex: Treating RN: Apr 24, 1950 (72 y.o. F) Primary Care Mathieu Schloemer: Jayme Cloud., [name] Other Clinician: Referring Gianfranco Araki: Treating Shuaib Corsino/Extender: Crista Curb in Treatment: 27 Active Problems Location of Pain Severity and Description of Pain Patient Has Paino No Site Locations [place], [place] R ([id number]) [id number] Page 4 of 6 Pain Management and Medication Current Pain Management: Electronic  Signature(s) Signed: 07/11/2022 2:39:11 PM By: Karl Ito Entered By: Karl Ito on 07/10/2022 13:32:29 -------------------------------------------------------------------------------- Patient/Caregiver Education Details Patient Name: Date of Service: [name] 5/1/2024andnbsp1:15 PM Medical Record Number: [id number] Patient Account Number: [id number] Date of Birth/Gender: Treating RN: 08-30-1950 (72 y.o. [name], [name] Primary Care Physician: Jayme Cloud., [name] Other Clinician: Referring Physician: Treating Physician/Extender: Crista Curb in Treatment: 27 Education Assessment Education Provided To: Patient Education Topics Provided Wound/Skin Impairment: Methods: Explain/Verbal Responses: Reinforcements needed, State content correctly [employer]) Signed: 07/10/2022 4:25:39 PM By: Fonnie Mu RN Entered By: Fonnie Mu on 07/10/2022 13:44:14 -------------------------------------------------------------------------------- Wound Assessment Details Patient Name: Date of Service: [name] 07/10/2022 1:15 PM Medical Record Number: [id number] Patient Account Number: [id number] Date of Birth/Sex: Treating RN: 1950/09/21 (72 y.o. F) Primary Care Aniah Pauli: Jayme Cloud., [name] Other Clinician: Referring Manasvini Whatley: Treating Dreana Britz/Extender: Crista Curb in Treatment: 27 Wound Status Wound Number: 1 Primary Etiology: Pressure Ulcer Wound Location: Sacrum Wound Status: Open Wounding Event: Pressure Injury Comorbid History: Colitis Date Acquired: 10/09/2021 Weeks Of Treatment: 27 Clustered Wound: No Lacson, Lorriann R ([id number]) [id number] Page 5 of 6 Photos Wound Measurements Length: (cm) 0.4 Width: (cm) 0.4 Depth: (cm) 1.3 Area: (  cm) 0.126 Volume: (cm) 0.163 % Reduction in Area: 64.3% % Reduction in Volume: 34% Epithelialization:  None Wound Description Classification: Category/Stage IV Wound Margin: Well defined, not attached Exudate Amount: Medium Exudate Type: Serosanguineous Exudate Color: red, brown Foul Odor After Cleansing: No Slough/Fibrino No Wound Bed Granulation Amount: Large (67-100%) Exposed Structure Granulation Quality: Red Fascia Exposed: No Necrotic Amount: None Present (0%) Fat Layer (Subcutaneous Tissue) Exposed: Yes Tendon Exposed: No Muscle Exposed: No Joint Exposed: No Bone Exposed: No Periwound Skin Texture Texture Color No Abnormalities Noted: No No Abnormalities Noted: No Callus: No Atrophie Blanche: No Crepitus: No Cyanosis: No Excoriation: No Ecchymosis: No Induration: No Erythema: No Rash: No Hemosiderin Staining: No Scarring: No Mottled: No Pallor: No Moisture Rubor: No No Abnormalities Noted: No Dry / Scaly: No Maceration: No Treatment Notes Wound #1 (Sacrum) Cleanser Vashe 5.8 (oz) Discharge Instruction: Cleanse the wound with Vashe prior to applying a clean dressing using gauze sponges, not tissue or cotton balls. Peri-Wound Care Topical Primary Dressing Cutimed Sorbact Swab Discharge Instruction: lightly pack into wound bed, tunnel, and undermining. Then apply wound vac. wound vac Discharge Instruction: apply over the cutimed sorbact swab and bridge to hip. Secondary Dressing Secured With DEMETRI, LAWRENSON ([id number]) [id number] Page 6 of 6 Compression Wrap Compression Stockings Add-Ons Electronic Signature(s) Signed: 07/11/2022 2:39:11 PM By: Karl Ito Entered By: Karl Ito on 07/10/2022 13:36:57 -------------------------------------------------------------------------------- Vitals Details Patient Name: Date of Service: [name] 07/10/2022 1:15 PM Medical Record Number: [id number] Patient Account Number: [id number] Date of Birth/Sex: Treating RN: [birth year] (72 y.o. F) Primary Care  Yacine Droz: Jayme Cloud., [name] Other Clinician: Referring Nimrod Wendt: Treating Twana Wileman/Extender: Crista Curb in Treatment: 27 Vital Signs Time Taken: 13:32 Temperature (F): 98.3 Height (in): 64 Pulse (bpm): 63 Weight (lbs): 97 Respiratory Rate (breaths/min): 18 Body Mass Index (BMI): 16.6 Blood Pressure (mmHg): 118/72 Reference Range: 80 - 120 mg / dl Electronic Signature(s) Signed: 07/11/2022 2:39:11 PM By: Karl Ito Entered By: Karl Ito on 07/10/2022 13:32:24

## 2022-07-15 ENCOUNTER — Encounter (HOSPITAL_BASED_OUTPATIENT_CLINIC_OR_DEPARTMENT_OTHER): Payer: 59 | Admitting: Internal Medicine

## 2022-07-15 DIAGNOSIS — L89154 Pressure ulcer of sacral region, stage 4: Secondary | ICD-10-CM | POA: Diagnosis not present

## 2022-07-15 DIAGNOSIS — E43 Unspecified severe protein-calorie malnutrition: Secondary | ICD-10-CM | POA: Diagnosis not present

## 2022-07-15 DIAGNOSIS — M6281 Muscle weakness (generalized): Secondary | ICD-10-CM | POA: Diagnosis not present

## 2022-07-15 DIAGNOSIS — M8648 Chronic osteomyelitis with draining sinus, other site: Secondary | ICD-10-CM | POA: Diagnosis not present

## 2022-07-15 NOTE — Progress Notes (Signed)
[name] ([id number]) [id number] Page 1 of 3 Visit Report for 07/15/2022 Arrival Information Details Patient Name: Date of Service: [name] 07/15/2022 3:30 PM Medical Record Number: [id number] Patient Account Number: [id number] Date of Birth/Sex: Treating RN: 1951-02-11 (72 y.o. F) Primary Care Marissa Moro: Marissa Cloud., [name] Other Clinician: Referring [name]: Treating [name]/Extender: Marissa Para., [name] in Treatment: 27 Visit Information History Since Last Visit Added or deleted any medications: No Patient Arrived: Ambulatory Any new allergies or adverse reactions: No Arrival Time: 16:10 Had a fall or experienced change in No Accompanied By: self activities of daily living that may affect Transfer Assistance: None risk of falls: Patient Identification Verified: Yes Signs or symptoms of abuse/neglect since last visito No Secondary Verification Process Completed: Yes Hospitalized since last visit: No Patient Requires Transmission-Based Precautions: No Has Dressing in Place as Prescribed: Yes Patient Has Alerts: No Pain Present Now: No Electronic Signature(s) Signed: 07/15/2022 4:50:46 PM By: [name] Entered By: [name] on 07/15/2022 16:10:48 -------------------------------------------------------------------------------- Encounter Discharge Information Details Patient Name: Date of Service: Marissa Berthold Cardenas. 07/15/2022 3:30 PM Medical Record Number: [id number] Patient Account Number: [id number] Date of Birth/Sex: Treating RN: 24-Sep-1950 (72 y.o. F) Primary Care [name]: Marissa Cloud., [name] Clinician: Thayer Cardenas Referring Farheen Pfahler: Treating Aerith Canal/Extender: Marissa Para., [name] in Treatment: 27 Encounter Discharge Information Items Discharge Condition: Stable Ambulatory Status: Ambulatory Discharge Destination: Home Transportation: Private Auto Accompanied By:  self Schedule Follow-up Appointment: Yes Clinical Summary of Care: Electronic Signature(s) Signed: 07/15/2022 4:50:46 PM By: [name] Entered By: [name] on 07/15/2022 16:49:50 -------------------------------------------------------------------------------- Negative Pressure Wound Therapy Maintenance (NPWT) Details Patient Name: Date of Service: [name] 07/15/2022 3:30 PM Medical Record Number: [id number] Patient Account Number: [id number] Date of Birth/Sex: Treating RN: 10/06/1950 (72 y.o. F) Primary Care [name]: Marissa Cloud., [name] Other Clinician: Referring Kenijah Benningfield: Treating Axl Rodino/Extender: Marissa Para., [name] in Treatment: 27 NPWT Maintenance Performed for: Wound #1 Sacrum Performed By: [name], Type: VAC System Coverage Size (sq cm): 0.16 Pressure Type: Constant Pressure Setting: 125 mmHG Drain Type: None [name] ([id number]) [phone].pdf Page 2 of 3 Primary Contact: Non-Adherent Sponge/Dressing Type: Foam- Black Date Initiated: 06/05/2022 Dressing Removed: Yes Quantity of Sponges/Gauze Removed: 1 cutimed, 1 black foam Canister Changed: Yes Canister Exudate Volume: 30 Quantity of Sponges/Gauze Inserted: 1 cutimed and 1 black foam for bridge Respones T Treatment: o tolerated well Days On NPWT : 41 Electronic Signature(s) Signed: 07/15/2022 4:50:46 PM By: [name] Entered By: [name] on 07/15/2022 16:49:08 -------------------------------------------------------------------------------- Patient/Caregiver Education Details Patient Name: Date of Service: [name] 5/6/2024andnbsp3:30 PM Medical Record Number: [id number] Patient Account Number: [id number] Date of Birth/Gender: Treating RN: [birth year] (72 y.o. F) Primary Care Physician: Marissa Cloud., [name] Clinician: Thayer Cardenas Referring Physician: Treating Physician/Extender: Marissa Para.,  [name] in Treatment: 27 Education Assessment Education Provided To: Patient Education Topics Provided Electronic Signature(s) Signed: 07/15/2022 4:50:46 PM By: [name] Entered By: [name] on 07/15/2022 16:49:30 -------------------------------------------------------------------------------- Wound Assessment Details Patient Name: Date of Service: [name] 07/15/2022 3:30 PM Medical Record Number: [id number] Patient Account Number: [id number] Date of Birth/Sex: Treating RN: Jul 13, 1950 (72 y.o. F) Primary Care Dutch Ing: Marissa Cloud., [name] Other Clinician: Referring Ahriana Gunkel: Treating Ara Grandmaison/Extender: Leo Grosser in Treatment: 27 Wound Status Wound Number: 1 Primary Etiology: Pressure Ulcer Wound Location: Sacrum Wound Status: Open Wounding Event: Pressure Injury Date Acquired:  10/09/2021 Weeks Of Treatment: 27 Clustered Wound: No Wound Measurements Length: (cm) 0.4 Width: (cm) 0.4 Depth: (cm) 1.3 Area: (cm) 0.126 Volume: (cm) 0.163 % Reduction in Area: 64.3% % Reduction in Volume: 34% Wound Description Classification: Category/Stage IV Exudate Amount: Medium Exudate Type: Serosanguineous Exudate Color: red, brown [name] ([id number]) Periwound Skin Texture Texture No Abnormalities Noted: No Moisture No Abnormalities Noted: No [phone].pdf Page 3 of 3 Color No Abnormalities Noted: No Treatment Notes Wound #1 (Sacrum) Cleanser Vashe 5.8 (oz) Discharge Instruction: Cleanse the wound with Vashe prior to applying a clean dressing using gauze sponges, not tissue or cotton balls. Peri-Wound Care Topical Primary Dressing Cutimed Sorbact Swab Discharge Instruction: lightly pack into wound bed, tunnel, and undermining. Then apply wound vac. wound vac Discharge Instruction: apply over the cutimed sorbact swab and bridge to hip. Secondary Dressing Secured  With Compression Wrap Compression Stockings Add-Ons Electronic Signature(s) Signed: 07/15/2022 4:50:46 PM By: [name] Entered By: [name] on 07/15/2022 16:11:09 -------------------------------------------------------------------------------- Vitals Details Patient Name: Date of Service: Marissa Berthold Cardenas. 07/15/2022 3:30 PM Medical Record Number: [id number] Patient Account Number: [id number] Date of Birth/Sex: Treating RN: 1950/04/29 (72 y.o. F) Primary Care Marlo Arriola: Marissa Cloud., [name] Other Clinician: Referring Isis Costanza: Treating Lyana Asbill/Extender: Leo Grosser in Treatment: 27 Vital Signs Time Taken: 16:10 Reference Range: 80 - 120 mg / dl Height (in): 64 Weight (lbs): 97 Body Mass Index (BMI): 16.6 Electronic Signature(s) Signed: 07/15/2022 4:50:46 PM By: [name] Entered By: [name] on 07/15/2022 16:10:58

## 2022-07-15 NOTE — Progress Notes (Signed)
MACII, ARD R ([id number]) [id number] Page 1 of 1 Visit Report for 07/15/2022 SuperBill Details Patient Name: Date of Service: [name] 07/15/2022 Medical Record Number: [id number] Patient Account Number: [id number] Date of Birth/Sex: Treating RN: Jan 13, 1951 (72 y.o. F) Primary Care Provider: Jayme Cloud., [name] Other Clinician: Referring Provider: Treating Provider/Extender: Leo Grosser in Treatment: 27 Diagnosis Coding ICD-10 Codes Code Description [phone] Pressure ulcer of sacral region, stage 4 M62.81 Muscle weakness (generalized) E43 Unspecified severe protein-calorie malnutrition Facility Procedures CPT4 Code Description Modifier Quantity [id number] [phone] - WOUND VAC-50 SQ CM OR LESS 1 Electronic Signature(s) Signed: 07/15/2022 4:50:46 PM By: Thayer Dallas Signed: 07/15/2022 4:51:59 PM By: Baltazar Najjar MD Entered By: Thayer Dallas on 07/15/2022 16:50:19

## 2022-07-16 NOTE — Progress Notes (Signed)
LEA, PURVIANCE Cardenas ([id number]) [id number] Page 1 of 3 Visit Report for 07/12/2022 Arrival Information Details Patient Name: Date of Service: [name] 07/12/2022 11:15 A M Medical Record Number: [id number] Patient Account Number: [id number] Date of Birth/Sex: Treating RN: Nov 12, 1950 (72 y.o. [name], [name] Primary Care Kriston Mckinnie: Jayme Cloud., [name] Other Clinician: Referring Zamaya Rapaport: Treating Kielyn Kardell/Extender: Crissie Figures in Treatment: 27 Visit Information History Since Last Visit Added or deleted any medications: No Patient Arrived: Ambulatory Any new allergies or adverse reactions: No Arrival Time: 12:00 Had a fall or experienced change in No Accompanied By: self activities of daily living that may affect Transfer Assistance: None risk of falls: Patient Identification Verified: Yes Signs or symptoms of abuse/neglect since last visito No Secondary Verification Process Completed: Yes Hospitalized since last visit: No Patient Requires Transmission-Based Precautions: No Implantable device outside of the clinic excluding No Patient Has Alerts: No cellular tissue based products placed in the center since last visit: Has Dressing in Place as Prescribed: Yes Pain Present Now: No Electronic Signature(s) Signed: 07/15/2022 5:08:59 PM By: Shawn Stall RN, BSN Entered By: Shawn Stall on 07/12/2022 13:13:01 -------------------------------------------------------------------------------- Encounter Discharge Information Details Patient Name: Date of Service: Marissa Berthold Cardenas. 07/12/2022 11:15 A M Medical Record Number: [id number] Patient Account Number: [id number] Date of Birth/Sex: Treating RN: 1950/04/22 (72 y.o. [name], [name] Primary Care Jevan Gaunt: Jayme Cloud., [name] Other Clinician: Referring Deland Slocumb: Treating Deborrah Mabin/Extender: Allena Katz., [name] in Treatment: 27 Encounter Discharge  Information Items Discharge Condition: Stable Ambulatory Status: Ambulatory Discharge Destination: Home Transportation: Private Auto Accompanied By: self Schedule Follow-up Appointment: Yes Clinical Summary of Care: Electronic Signature(s) Signed: 07/15/2022 5:08:59 PM By: Shawn Stall RN, BSN Entered By: Shawn Stall on 07/12/2022 13:14:25 -------------------------------------------------------------------------------- Negative Pressure Wound Therapy Maintenance (NPWT) Details Patient Name: Date of Service: [name] 07/12/2022 11:15 A M Medical Record Number: [id number] Patient Account Number: [id number] Date of Birth/Sex: Treating RN: May 18, 1950 (72 y.o. [name], [name] Primary Care Allee Busk: Jayme Cloud., [name] Other Clinician: Referring Auden Tatar: Treating Aryka Coonradt/Extender: Allena Katz., [name] in Treatment: 27 NPWT Maintenance Performed for: Wound #1 Sacrum Performed By: Shawn Stall, RN Coverage Size (sq cm): 0.16 Pressure Type: Constant ZAIDE, GREENLIEF Cardenas ([id number]) [phone].pdf Page 2 of 3 Pressure Setting: 125 mmHG Drain Type: None Primary Contact: Non-Adherent Sponge/Dressing Type: Foam- Black Date Initiated: 06/05/2022 Dressing Removed: Yes Quantity of Sponges/Gauze Removed: x1 cutimed sorbact swab and x1 black foam Canister Changed: No Canister Exudate Volume: 5 Dressing Reapplied: Yes Quantity of Sponges/Gauze Inserted: x1 cutimed sorbact swab and x1 black foam Respones T Treatment: o tolerated well Days On NPWT : 38 Electronic Signature(s) Signed: 07/15/2022 5:08:59 PM By: Shawn Stall RN, BSN Entered By: Shawn Stall on 07/12/2022 13:14:04 -------------------------------------------------------------------------------- Wound Assessment Details Patient Name: Date of Service: Marissa Deliah Goody Cardenas. 07/12/2022 11:15 A M Medical Record Number: [id number] Patient Account Number: [id number] Date of Birth/Sex:  Treating RN: 06/26/1950 (72 y.o. [name], [name] Primary Care Moshe Wenger: Jayme Cloud., [name] Other Clinician: Referring Daejah Klebba: Treating Sohan Potvin/Extender: Crissie Figures in Treatment: 27 Wound Status Wound Number: 1 Primary Etiology: Pressure Ulcer Wound Location: Sacrum Wound Status: Open Wounding Event: Pressure Injury Date Acquired: 10/09/2021 Weeks Of Treatment: 27 Clustered Wound: No Wound Measurements Length: (cm) 0.4 Width: (cm) 0.4 Depth: (cm) 1.3 Area: (cm) 0.126 Volume: (cm) 0.163 % Reduction in Area: 64.3% % Reduction in Volume: 34% Wound Description Classification: Category/Stage IV Exudate Amount:  Medium Exudate Type: Serosanguineous Exudate Color: red, brown Periwound Skin Texture Texture Color No Abnormalities Noted: No No Abnormalities Noted: No Moisture No Abnormalities Noted: No Treatment Notes Wound #1 (Sacrum) Cleanser Vashe 5.8 (oz) Discharge Instruction: Cleanse the wound with Vashe prior to applying a clean dressing using gauze sponges, not tissue or cotton balls. Peri-Wound Care Topical Primary Dressing Cutimed Sorbact Swab Discharge Instruction: lightly pack into wound bed, tunnel, and undermining. Then apply wound vac. wound vac [name], [name] ([id number]) [phone].pdf Page 3 of 3 Discharge Instruction: apply over the cutimed sorbact swab and bridge to hip. Secondary Dressing Secured With Compression Wrap Compression Stockings Add-Ons Electronic Signature(s) Signed: 07/15/2022 5:08:59 PM By: Shawn Stall RN, BSN Entered By: Shawn Stall on 07/12/2022 13:13:32

## 2022-07-17 ENCOUNTER — Encounter (HOSPITAL_BASED_OUTPATIENT_CLINIC_OR_DEPARTMENT_OTHER): Payer: 59 | Admitting: Physician Assistant

## 2022-07-17 DIAGNOSIS — M6281 Muscle weakness (generalized): Secondary | ICD-10-CM | POA: Diagnosis not present

## 2022-07-17 DIAGNOSIS — L89154 Pressure ulcer of sacral region, stage 4: Secondary | ICD-10-CM | POA: Diagnosis not present

## 2022-07-17 DIAGNOSIS — M8648 Chronic osteomyelitis with draining sinus, other site: Secondary | ICD-10-CM | POA: Diagnosis not present

## 2022-07-17 DIAGNOSIS — E43 Unspecified severe protein-calorie malnutrition: Secondary | ICD-10-CM | POA: Diagnosis not present

## 2022-07-17 NOTE — Progress Notes (Signed)
AZAHRIA, LEHMANN R ([id number]) [id number] Page 1 of 7 Visit Report for 07/17/2022 Chief Complaint Document Details Patient Name: Date of Service: [name]. 07/17/2022 11:00 A M Medical Record Number: [id number] Patient Account Number: [id number] Date of Birth/Sex: Treating RN: [birth year] (72 y.o. F) Primary Care Provider: Jayme Cloud., [name] Other Clinician: Referring Provider: Treating Provider/Extender: Crista Curb in Treatment: 28 Information Obtained from: Patient Chief Complaint Pressure ulcer stage 3 Sacrum Electronic Signature(s) Signed: 07/17/2022 11:17:28 AM By: Allen Derry PA-C Entered By: Allen Derry on 07/17/2022 11:17:27 -------------------------------------------------------------------------------- HPI Details Patient Name: Date of Service: Marissa [name] R. 07/17/2022 11:00 A M Medical Record Number: [id number] Patient Account Number: [id number] Date of Birth/Sex: Treating RN: [birth year] (72 y.o. F) Primary Care Provider: Jayme Cloud., [name] Other Clinician: Referring Provider: Treating Provider/Extender: Crista Curb in Treatment: 28 History of Present Illness HPI Description: 01-02-22 upon evaluation today patient presents for initial inspection here in our clinic concerning a wound that she has over the sacral region. This is stated to be present since around the beginning of August 2023. She currently resides in a assisted nursing facility. She does seem to be able to answer questions fully today. Subsequently I do note that she is a little on the thin side but again other than the protein calorie malnutrition she is minimally weak but still does get up and move around some but she also tells me that she "sits a lot". She has not had any x-rays of the sacral region at this point. 01-09-2022 upon evaluation today patient's wound in the sacral area actually appears to be doing  decently well. Fortunately I do not see any signs of infection which is great news and overall I am extremely pleased with where things stand today. 11/8; this is a patient who lives in some form of small assisted living in [place]. She has a deep wound over the lower part of her coccyx. An x-ray that we ordered from last time did not show any osseous abnormalities. We are supposed to be using Hydrofera Blue in the wound but I am really not sure what they are using to dress this and who is doing it. Talking to our staff they have apparently discussed this with the staff in the facility. She does not have home health. 01-23-2022 upon evaluation today patient appears to be doing decently well in regard to her wound. She has been tolerating the dressing changes without complication although I am not certain the dressing changes have been done appropriately over the past couple of weeks. We have been trying to get her to come in here we also try to get her into a wound care center in [place] which would be closer for the assisted living facility. Unfortunately neither 1 of those were undertaken up to this point. The patient did end up having some training of the staff from an RN that they brought him to have the staff perform the dressing changes but that being said it does not sound like this has been done correctly. Nonetheless I do believe that based on what we see it would benefit the patient to actually have her come here 3 times a week also think a wound VAC could potentially be a possibility for her which would help to get things moving in a much better direction as far as healing is concerned. We need to get this to fill-in though it looks clean and  does not look infected I do think that we need to really get this to fill-in and there is quite a bit of undermining that is to be considered here. 01-30-2022 upon evaluation today patient appears to be doing well currently in regard to her wound which  is looking pretty decent but still has quite a bit of space underneath as far as undermining is concerned. I think she might possibly be better with a wound VAC. With that being said if when I do this we probably only be able to do it 2 times a week at most. I discussed that with the patient today she is okay with that we just need to see if we can get the insurance approval and then of course the scheduling underway. 02-13-2022 upon evaluation today patient appears to be doing well currently in regard to her wound. She actually tells me that it is feeling a lot better which is good news. Fortunately I do not see any signs of active infection at this time. No fevers, chills, nausea, vomiting, or diarrhea. 02-27-2022 upon evaluation today patient appears to be doing well currently in regard to her wound. She is actually showing some signs of improvement we are still obtaining the approval for the snap VAC which I think could be beneficial in the meantime we been using the Hydrofera Blue rope. 03-27-2022 upon evaluation today patient appears to be doing okay in regard to her wound but there was no dressing in place upon arrival today. Fortunately I see again no evidence of infection. No fevers, chills, nausea, vomiting, or diarrhea. 04-03-2022 upon evaluation today patient did not have a dressing on when she came into the office. With that being said she actually tells me that it was on until yesterday she took a shower and it came loose and then today came off completely. With that being said I really do not think she needs to be taking shower every single day or having to see her here in the clinic 3 times per week on Monday, Wednesday, and Friday and during those times obviously in between she needs to probably avoid showering. For that reason what I advised her to do would be to shower in the mornings on the days that she comes to see [place] that way if the dressing does come off or start to come off we  will be changing it anyway. She voiced understanding and tells me she can do that. Unfortunately the facility has nobody to help her with changing the dressings and we are not currently having to do that here in the clinic again. This means CARALENA, HASCH R ([id number]) [id number] Page 2 of 7 that during the time that she was not coming in from December 20 through when I saw her last week on the 17th there was a period of 2 weeks where the wound really was not changed at all as the RN that Baird Lyons tell me they had at the facility did not end up working out. This obviously is not good and is not what we expect to see as far as patient care is concerned which is why we are now seeing her 3 times a week here in the clinic. 04-10-2022 upon evaluation today patient appears to be doing well currently in regard to her wound which is actually showing signs of good granulation epithelization at this point. However she does have quite a bit of drainage and we are little concerned about the possibility of infection. For  that reason I think she could benefit from a PCR culture followed by [facility] topical antibiotics. I am also thinking of going ahead and doing gentamicin today as well. 2/7; small wound on the lower sacrum however with considerable degree of undermining from roughly 7-4. PCR culture that was done last week showed "no organisms". We have been using gentamicin and iodoform packing.She lives in some form of group home in [place] I believe. Uncertain about the adequacy of offloading this area 04-24-2022 upon evaluation patient's wound actually is showing signs of doing decently well I do believe she would benefit from a snap VAC and we discussed that again today. Fortunately I do not see any signs of active infection locally nor systemically which is great news. No fevers, chills, nausea, vomiting, or diarrhea. 05-01-2022 upon evaluation today patient's wound is really  doing about the same. Fortunately I do not see any signs of active infection locally nor systemically at this time which is great news with that being said we have gotten approval for the snap VAC and we will going to go ahead and place that today. 05-08-2022 upon evaluation today patient appears to be doing well currently in regard to her wound all things considered but were having a difficult time with a snap VAC suctioning appropriately. With that being said we will get a go ahead and likely avoid the snap VAC at this point at least until we can get the actual brace dressing as bridging ourselves does not seem to be working nearly as well. 3/6; I note the difficulty with a snap VAC which is disappointing but apparently another type of VAC is being considered. Also a referral to plastic surgery at [facility] The patient has a very small but deep and at the base circumferential undermining. She is coming in here now 3 times a week for [facility] placement 3/13; patient presents for follow-up. She has been using Hydrofera Blue 3 times a week. She comes into our clinic for nurse visits to have this done. We are still awaiting a wound VAC. She has no issues or complaints today. 05-29-2022 upon evaluation today patient appears to be doing about the same in regard to her wound. She still has significant undermining although the opening is a huge the undermining is significant. I do believe that a gauze VAC would be beneficial here. The patient does not eat normally and in fact tells me that she eats very well. She also is doing supplements with Juven at this point. She also has a good ability to get up and move around she is completely ambulatory and does not just lay with pressure on this area. She also is able to roll and reposition in bed without complication. For that reason she really has no need for a group 2 mattress at this point whatsoever this would be a complete waste of resources as far as  insurance and medical equipment is concerned. As far as her urinary incontinence she has no issues with incontinence at this point she is able to carry herself to the bathroom when needed she does wear a depends just in case but again that is not a routinely utilized item. 06-05-2022 upon evaluation today patient appears to be doing decently well in regard to her wound I do not see any signs of infection overall she seems to be doing quite well. I do think that she is making good progress in general here. We do have the wound VAC however and I think  this is going to potentially help to get this. Hand has been the biggest issue we have had is getting this to actually feeling more effectively. 06-12-2022 upon evaluation today patient appears to be doing okay in regard to her wound. We are still seeing some signs of improvement. Fortunately there does not appear to be any evidence of active infection at this time which is good news she did see the doctors over at [facility] yesterday and they are ordering an MRI in preparation for considering possibility of a surgical closure. The patient voiced understanding she had forgotten we have made this referral. T be o honest I was back in February kind of forgot about the referral to in the interim trying to proceed with her wound care. Nonetheless I appreciate their be evaluation of this and again we will see what the MRI shows. 06-19-2022 upon evaluation today patient's wound is actually showing signs of good improvement and very pleased with where we stand today. Fortunately I do not see any evidence of infection locally nor systemically which is great news I think the wound VAC is doing a good job of undermining seems less than last week. 06-26-2022 upon evaluation today patient appears to be doing well with regard to her wound although her wound VAC it actually died she had forgotten to charge it overnight. With that being said she does seem to be doing decently  well at this point based on what I am seeing I think that the undermining is much less than what it was previous. Fortunately I do not see any signs of active infection locally nor systemically at this time. 07-03-2022 upon evaluation today patient appears to be doing decently well in regard to her wound. She actually did have an MRI performed and I did review that today as well. It shows that she actually does have a focal marrow signal abnormality in the left aspect of the sacrum at the S4 level with adjacent soft tissue swelling and presumed sacral decubitus ulcer concerning for a small area of osteomyelitis. Otherwise there did not appear to be any fluid collection which is good news there is no signs of an obvious abscess. 07-10-2022 upon evaluation today patient appears to be doing well currently in regard to her wound. She has been tolerating the dressing changes without complication. Fortunately there does not appear to be any signs of active infection which is good news. 07-17-2022 upon evaluation today patient unfortunately does not seem to be making progress with the gauze VAC. We have given her a good trial with this and unfortunately I do not think that we are seeing any improvement for that reason I think it would be best to send this back and not continue therapy with the gauze back at this point. The patient voiced understanding and she is in agreement with that plan. She does have an appointment with the plastic surgeon at [facility] it was supposed to be yesterday but apparently got moved next Tuesday, May 14. Electronic Signature(s) Signed: 07/17/2022 1:40:47 PM By: Allen Derry PA-C Entered By: Allen Derry on 07/17/2022 13:40:47 -------------------------------------------------------------------------------- Physical Exam Details Patient Name: Date of Service: Marissa [name] R. 07/17/2022 11:00 A M Medical Record Number: [id number] Patient Account Number: [id number] Date of Birth/Sex:  Treating RN: 29-Feb-1951 (72 y.o. F) Primary Care Provider: Jayme Cloud., [name] Other Clinician: Referring Provider: Treating Provider/Extender: Crista Curb in Treatment: [address], [place] R ([id number]) [id number] Page 3 of 7 Well-nourished  and well-hydrated in no acute distress. Respiratory normal breathing without difficulty. Psychiatric this patient is able to make decisions and demonstrates good insight into disease process. Alert and Oriented x 3. pleasant and cooperative. Notes Upon inspection patient's wound bed actually showed signs of good granulation and epithelization at this point. Fortunately I do not see any signs of worsening or infection at this time which is great news and in general I do believe that we are really making good progress here which is great news. No fevers, chills, nausea, vomiting, or diarrhea. Electronic Signature(s) Signed: 07/17/2022 1:41:00 PM By: Allen Derry PA-C Entered By: Allen Derry on 07/17/2022 13:41:00 -------------------------------------------------------------------------------- Physician Orders Details Patient Name: Date of Service: Marissa [name] R. 07/17/2022 11:00 A M Medical Record Number: [id number] Patient Account Number: [id number] Date of Birth/Sex: Treating RN: 1950-08-26 (72 y.o. [name] Primary Care Provider: Jayme Cloud., [name] Other Clinician: Referring Provider: Treating Provider/Extender: Crista Curb in Treatment: 28 Verbal / Phone Orders: No Diagnosis Coding ICD-10 Coding Code Description L89.154 Pressure ulcer of sacral region, stage 4 M62.81 Muscle weakness (generalized) E43 Unspecified severe protein-calorie malnutrition Follow-up Appointments ppointment in 1 week. Leonard Schwartz Wednesday 1100 room 8 Return A Nurse Visit: - dressing change Friday 07/19/2022 Monday 07/22/2022 Other: - Follow up with [facility] on 07/23/2022. Had bloodwork done today 07/10/22- not in epic yet. Anesthetic (In clinic) Topical Lidocaine 4% applied to wound bed Negative Presssure Wound Therapy Discontinue wound vac. Call the number on the machine for the company to pick up. Off-Loading Turn and reposition every 2 hours - when sitting. walk hourly to aid in offloading pressure to buttock. Wound Treatment Wound #1 - Sacrum Cleanser: Normal Saline 3 x Per Week Discharge Instructions: Cleanse the wound with Normal Saline prior to applying a clean dressing using gauze sponges, not tissue or cotton balls. Cleanser: Vashe 5.8 (oz) 3 x Per Week Discharge Instructions: Cleanse the wound with Vashe prior to applying a clean dressing using gauze sponges, not tissue or cotton balls. Peri-Wound Care: Skin Prep 3 x Per Week Discharge Instructions: Use skin prep as directed Prim Dressing: Hydrofera Blue Classic Foam Rope Dressing, 9x6 (mm/in) ary 3 x Per Week Discharge Instructions: Moisten with saline prior to packing Secondary Dressing: Woven Gauze Sponge, Non-Sterile 4x4 in 3 x Per Week Discharge Instructions: Apply over primary dressing as directed. Secondary Dressing: Zetuvit Plus Silicone Border Dressing 7x7(in/in) 3 x Per Week Discharge Instructions: Apply silicone border over primary dressing as directed. [name], Marissa R ([id number]) [id number] Page 4 of 7 Electronic Signature(s) Signed: 07/17/2022 4:03:05 PM By: Redmond Pulling RN, BSN Signed: 07/17/2022 4:34:38 PM By: Allen Derry PA-C Entered By: Redmond Pulling on 07/17/2022 11:27:54 -------------------------------------------------------------------------------- Problem List Details Patient Name: Date of Service: Marissa [name] R. 07/17/2022 11:00 A M Medical Record Number: [id number] Patient Account Number: [id number] Date of Birth/Sex: Treating RN: Jul 08, 1950 (72 y.o. F) Primary Care Provider: Jayme Cloud., [name] Other  Clinician: Referring Provider: Treating Provider/Extender: Crista Curb in Treatment: 28 Active Problems ICD-10 Encounter Code Description Active Date MDM Diagnosis L89.154 Pressure ulcer of sacral region, stage 4 01/02/2022 No Yes M62.81 Muscle weakness (generalized) 01/02/2022 No Yes E43 Unspecified severe protein-calorie malnutrition 01/02/2022 No Yes Inactive Problems Resolved Problems Electronic Signature(s) Signed: 07/17/2022 11:17:19 AM By: Allen Derry PA-C Entered By: Allen Derry on 07/17/2022 11:17:19 -------------------------------------------------------------------------------- Progress Note Details Patient Name: Date of Service: [name], Marissa NEY R. 07/17/2022 11:00 A M  Medical Record Number: [id number] Patient Account Number: [id number] Date of Birth/Sex: Treating RN: 18-Dec-1950 (72 y.o. F) Primary Care Provider: Jayme Cloud., [name] Other Clinician: Referring Provider: Treating Provider/Extender: Crista Curb in Treatment: 28 Subjective Chief Complaint Information obtained from Patient Pressure ulcer stage 3 Sacrum History of Present Illness (HPI) 01-02-22 upon evaluation today patient presents for initial inspection here in our clinic concerning a wound that she has over the sacral region. This is stated to be present since around the beginning of August 2023. She currently resides in a assisted nursing facility. She does seem to be able to answer questions fully today. Subsequently I do note that she is a little on the thin side but again other than the protein calorie malnutrition she is minimally weak but still does get up and move around some but she also tells me that she "sits a lot". She has not had any x-rays of the sacral region at this point. 01-09-2022 upon evaluation today patient's wound in the sacral area actually appears to be doing decently well. Fortunately I do not see any signs of infection which  is great news and overall I am extremely pleased with where things stand today. 11/8; this is a patient who lives in some form of small assisted living in [place]. She has a deep wound over the lower part of her coccyx. An x-ray that we ordered from last time did not show any osseous abnormalities. We are supposed to be using Hydrofera Blue in the wound but I am really not sure what they are using to dress this and who is doing it. Talking to our staff they have apparently discussed this with the staff in the facility. She does not have home [name], Marissa R ([id number]) [id number] Page 5 of 7 health. 01-23-2022 upon evaluation today patient appears to be doing decently well in regard to her wound. She has been tolerating the dressing changes without complication although I am not certain the dressing changes have been done appropriately over the past couple of weeks. We have been trying to get her to come in here we also try to get her into a wound care center in [place] which would be closer for the assisted living facility. Unfortunately neither 1 of those were undertaken up to this point. The patient did end up having some training of the staff from an RN that they brought him to have the staff perform the dressing changes but that being said it does not sound like this has been done correctly. Nonetheless I do believe that based on what we see it would benefit the patient to actually have her come here 3 times a week also think a wound VAC could potentially be a possibility for her which would help to get things moving in a much better direction as far as healing is concerned. We need to get this to fill-in though it looks clean and does not look infected I do think that we need to really get this to fill-in and there is quite a bit of undermining that is to be considered here. 01-30-2022 upon evaluation today patient appears to be doing well currently in regard to her  wound which is looking pretty decent but still has quite a bit of space underneath as far as undermining is concerned. I think she might possibly be better with a wound VAC. With that being said if when I do this we probably only be able to do it  2 times a week at most. I discussed that with the patient today she is okay with that we just need to see if we can get the insurance approval and then of course the scheduling underway. 02-13-2022 upon evaluation today patient appears to be doing well currently in regard to her wound. She actually tells me that it is feeling a lot better which is good news. Fortunately I do not see any signs of active infection at this time. No fevers, chills, nausea, vomiting, or diarrhea. 02-27-2022 upon evaluation today patient appears to be doing well currently in regard to her wound. She is actually showing some signs of improvement we are still obtaining the approval for the snap VAC which I think could be beneficial in the meantime we been using the Hydrofera Blue rope. 03-27-2022 upon evaluation today patient appears to be doing okay in regard to her wound but there was no dressing in place upon arrival today. Fortunately I see again no evidence of infection. No fevers, chills, nausea, vomiting, or diarrhea. 04-03-2022 upon evaluation today patient did not have a dressing on when she came into the office. With that being said she actually tells me that it was on until yesterday she took a shower and it came loose and then today came off completely. With that being said I really do not think she needs to be taking shower every single day or having to see her here in the clinic 3 times per week on Monday, Wednesday, and Friday and during those times obviously in between she needs to probably avoid showering. For that reason what I advised her to do would be to shower in the mornings on the days that she comes to see [place] that way if the dressing does come off or start to  come off we will be changing it anyway. She voiced understanding and tells me she can do that. Unfortunately the facility has nobody to help her with changing the dressings and we are not currently having to do that here in the clinic again. This means that during the time that she was not coming in from December 20 through when I saw her last week on the 17th there was a period of 2 weeks where the wound really was not changed at all as the RN that Baird Lyons tell me they had at the facility did not end up working out. This obviously is not good and is not what we expect to see as far as patient care is concerned which is why we are now seeing her 3 times a week here in the clinic. 04-10-2022 upon evaluation today patient appears to be doing well currently in regard to her wound which is actually showing signs of good granulation epithelization at this point. However she does have quite a bit of drainage and we are little concerned about the possibility of infection. For that reason I think she could benefit from a PCR culture followed by [facility] topical antibiotics. I am also thinking of going ahead and doing gentamicin today as well. 2/7; small wound on the lower sacrum however with considerable degree of undermining from roughly 7-4. PCR culture that was done last week showed "no organisms". We have been using gentamicin and iodoform packing.She lives in some form of group home in [place] I believe. Uncertain about the adequacy of offloading this area 04-24-2022 upon evaluation patient's wound actually is showing signs of doing decently well I do believe she would benefit from a snap  VAC and we discussed that again today. Fortunately I do not see any signs of active infection locally nor systemically which is great news. No fevers, chills, nausea, vomiting, or diarrhea. 05-01-2022 upon evaluation today patient's wound is really doing about the same. Fortunately I do not see any signs of active  infection locally nor systemically at this time which is great news with that being said we have gotten approval for the snap VAC and we will going to go ahead and place that today. 05-08-2022 upon evaluation today patient appears to be doing well currently in regard to her wound all things considered but were having a difficult time with a snap VAC suctioning appropriately. With that being said we will get a go ahead and likely avoid the snap VAC at this point at least until we can get the actual brace dressing as bridging ourselves does not seem to be working nearly as well. 3/6; I note the difficulty with a snap VAC which is disappointing but apparently another type of VAC is being considered. Also a referral to plastic surgery at [facility]. The patient has a very small but deep and at the base circumferential undermining. She is coming in here now 3 times a week for [facility] placement 3/13; patient presents for follow-up. She has been using Hydrofera Blue 3 times a week. She comes into our clinic for nurse visits to have this done. We are still awaiting a wound VAC. She has no issues or complaints today. 05-29-2022 upon evaluation today patient appears to be doing about the same in regard to her wound. She still has significant undermining although the opening is a huge the undermining is significant. I do believe that a gauze VAC would be beneficial here. The patient does not eat normally and in fact tells me that she eats very well. She also is doing supplements with Juven at this point. She also has a good ability to get up and move around she is completely ambulatory and does not just lay with pressure on this area. She also is able to roll and reposition in bed without complication. For that reason she really has no need for a group 2 mattress at this point whatsoever this would be a complete waste of resources as far as insurance and medical equipment is concerned. As far as her urinary  incontinence she has no issues with incontinence at this point she is able to carry herself to the bathroom when needed she does wear a depends just in case but again that is not a routinely utilized item. 06-05-2022 upon evaluation today patient appears to be doing decently well in regard to her wound I do not see any signs of infection overall she seems to be doing quite well. I do think that she is making good progress in general here. We do have the wound VAC however and I think this is going to potentially help to get this. Hand has been the biggest issue we have had is getting this to actually feeling more effectively. 06-12-2022 upon evaluation today patient appears to be doing okay in regard to her wound. We are still seeing some signs of improvement. Fortunately there does not appear to be any evidence of active infection at this time which is good news she did see the doctors over at [facility] yesterday and they are ordering an MRI in preparation for considering possibility of a surgical closure. The patient voiced understanding she had forgotten we have made this referral.  T be o honest I was back in February kind of forgot about the referral to in the interim trying to proceed with her wound care. Nonetheless I appreciate their be evaluation of this and again we will see what the MRI shows. 06-19-2022 upon evaluation today patient's wound is actually showing signs of good improvement and very pleased with where we stand today. Fortunately I do not see any evidence of infection locally nor systemically which is great news I think the wound VAC is doing a good job of undermining seems less than last week. 06-26-2022 upon evaluation today patient appears to be doing well with regard to her wound although her wound VAC it actually died she had forgotten to charge it overnight. With that being said she does seem to be doing decently well at this point based on what I am seeing I think that the  undermining is much less than what it was previous. Fortunately I do not see any signs of active infection locally nor systemically at this time. 07-03-2022 upon evaluation today patient appears to be doing decently well in regard to her wound. She actually did have an MRI performed and I did review that today as well. It shows that she actually does have a focal marrow signal abnormality in the left aspect of the sacrum at the S4 level with adjacent soft tissue swelling and presumed sacral decubitus ulcer concerning for a small area of osteomyelitis. Otherwise there did not appear to be any fluid collection which is good news there is no signs of an obvious abscess. [name], Marissa R ([id number]) [id number] Page 6 of 7 07-10-2022 upon evaluation today patient appears to be doing well currently in regard to her wound. She has been tolerating the dressing changes without complication. Fortunately there does not appear to be any signs of active infection which is good news. 07-17-2022 upon evaluation today patient unfortunately does not seem to be making progress with the gauze VAC. We have given her a good trial with this and unfortunately I do not think that we are seeing any improvement for that reason I think it would be best to send this back and not continue therapy with the gauze back at this point. The patient voiced understanding and she is in agreement with that plan. She does have an appointment with the plastic surgeon at [facility] it was supposed to be yesterday but apparently got moved next Tuesday, May 14. Objective Constitutional Well-nourished and well-hydrated in no acute distress. Vitals Time Taken: 11:09 AM, Height: 64 in, Weight: 97 lbs, BMI: 16.6, Temperature: 98.3 F, Pulse: 59 bpm, Respiratory Rate: 16 breaths/min, Blood Pressure: 148/83 mmHg. Respiratory normal breathing without difficulty. Psychiatric this patient is able to make decisions and  demonstrates good insight into disease process. Alert and Oriented x 3. pleasant and cooperative. General Notes: Upon inspection patient's wound bed actually showed signs of good granulation and epithelization at this point. Fortunately I do not see any signs of worsening or infection at this time which is great news and in general I do believe that we are really making good progress here which is great news. No fevers, chills, nausea, vomiting, or diarrhea. Integumentary (Hair, Skin) Wound #1 status is Open. Original cause of wound was Pressure Injury. The date acquired was: 10/09/2021. The wound has been in treatment 28 weeks. The wound is located on the Sacrum. The wound measures 0.5cm length x 0.4cm width x 0.9cm depth; 0.157cm^2 area and 0.141cm^3 volume. There is Fat Layer (  Subcutaneous Tissue) exposed. There is tunneling at 1:00 with a maximum distance of 1.7cm. There is a medium amount of serosanguineous drainage noted. There is large (67-100%) red granulation within the wound bed. There is no necrotic tissue within the wound bed. Assessment Active Problems ICD-10 Pressure ulcer of sacral region, stage 4 Muscle weakness (generalized) Unspecified severe protein-calorie malnutrition Plan Follow-up Appointments: Return Appointment in 1 week. Leonard Schwartz Wednesday 1100 room 8 Nurse Visit: - dressing change Friday 07/19/2022 Monday 07/22/2022 Other: - Follow up with [facility] on 07/23/2022. Had bloodwork done today 07/10/22- not in epic yet. Anesthetic: (In clinic) Topical Lidocaine 4% applied to wound bed Negative Presssure Wound Therapy: Discontinue wound vac. Call the number on the machine for the company to pick up. Off-Loading: Turn and reposition every 2 hours - when sitting. walk hourly to aid in offloading pressure to buttock. WOUND #1: - Sacrum Wound Laterality: Cleanser: Normal Saline 3 x Per Week/ Discharge Instructions: Cleanse the wound with Normal Saline prior to applying a  clean dressing using gauze sponges, not tissue or cotton balls. Cleanser: Vashe 5.8 (oz) 3 x Per Week/ Discharge Instructions: Cleanse the wound with Vashe prior to applying a clean dressing using gauze sponges, not tissue or cotton balls. Peri-Wound Care: Skin Prep 3 x Per Week/ Discharge Instructions: Use skin prep as directed Prim Dressing: Hydrofera Blue Classic Foam Rope Dressing, 9x6 (mm/in) 3 x Per Week/ ary Discharge Instructions: Moisten with saline prior to packing Secondary Dressing: Woven Gauze Sponge, Non-Sterile 4x4 in 3 x Per Week/ Discharge Instructions: Apply over primary dressing as directed. Secondary Dressing: Zetuvit Plus Silicone Border Dressing 7x7(in/in) 3 x Per Week/ Discharge Instructions: Apply silicone border over primary dressing as directed. [name], Marissa R ([id number]) [id number] Page 7 of 7 1. Based on what I am seeing I do believe that we should discontinue wound care services with regard to the sna gauze VAC I do not believe this is doing what we wanted to do. 2. I am going to suggest as well we switch over to [facility] Blue rope which I think may do better for the patient and as far as the packing is concerned I think that this will be at least something that we will keep it from sealing up prematurely until we can get the patient into plastic surgery I am hopeful as is the patient that there will be some way that they can actually get this this should actually be next week closed by surgical means. we will see patient back for reevaluation in 1 week here in the clinic. If anything worsens or changes patient will contact our office for additional recommendations. p Electronic Signature(s) Signed: 07/17/2022 1:42:50 PM By: Allen Derry PA-C Entered By: Allen Derry on 07/17/2022 13:42:50 -------------------------------------------------------------------------------- SuperBill Details Patient Name: Date of Service: Marissa [name] R. 07/17/2022 Medical Record Number: [id number] Patient Account Number: [id number] Date of Birth/Sex: Treating RN: [birth year] (72 y.o. [name] Primary Care Provider: Jayme Cloud., [name] Other Clinician: Referring Provider: Treating Provider/Extender: Crista Curb in Treatment: 28 Diagnosis Coding ICD-10 Codes Code Description [phone] Pressure ulcer of sacral region, stage 4 M62.81 Muscle weakness (generalized) E43 Unspecified severe protein-calorie malnutrition Facility Procedures : CPT4 Code: [id number] Description: [phone] - WOUND CARE VISIT-LEV 2 EST PT Modifier: Quantity: 1 Physician Procedures : CPT4 Code Description Modifier [id number] - WC PHYS LEVEL 3 - EST PT ICD-10 Diagnosis Description L89.154 Pressure ulcer of sacral region, stage 4 M62.81 Muscle weakness (  generalized) E43 Unspecified severe protein-calorie malnutrition Quantity: 1 Electronic Signature(s) Signed: 07/17/2022 1:43:54 PM By: Allen Derry PA-C Entered By: Allen Derry on 07/17/2022 13:43:53

## 2022-07-17 NOTE — Progress Notes (Signed)
KUMBA, DIEGO R ([id number]) [id number] Page 1 of 7 Visit Report for 07/17/2022 Arrival Information Details Patient Name: Date of Service: [name]. 07/17/2022 11:00 A M Medical Record Number: [id number] Patient Account Number: [id number] Date of Birth/Sex: Treating RN: Jul 08, 1950 (72 y.o. Orville Govern Primary Care Anniebell Bedore: Jayme Cloud., [name] Other Clinician: Referring Jermani Eberlein: Treating Denetta Fei/Extender: Crista Curb in Treatment: 28 Visit Information History Since Last Visit Added or deleted any medications: No Patient Arrived: Ambulatory Any new allergies or adverse reactions: No Arrival Time: 11:08 Had a fall or experienced change in No Accompanied By: Self activities of daily living that may affect Transfer Assistance: None risk of falls: Patient Identification Verified: Yes Signs or symptoms of abuse/neglect since last visito No Secondary Verification Process Completed: Yes Hospitalized since last visit: No Patient Requires Transmission-Based Precautions: No Implantable device outside of the clinic excluding No Patient Has Alerts: No cellular tissue based products placed in the center since last visit: Has Dressing in Place as Prescribed: Yes Pain Present Now: No Electronic Signature(s) Signed: 07/17/2022 4:03:05 PM By: Redmond Pulling RN, BSN Entered By: Redmond Pulling on 07/17/2022 11:08:45 -------------------------------------------------------------------------------- Clinic Level of Care Assessment Details Patient Name: Date of Service: [name] 07/17/2022 11:00 A M Medical Record Number: [id number] Patient Account Number: [id number] Date of Birth/Sex: Treating RN: 1950-12-11 (72 y.o. Orville Govern Primary Care Zymeir Salminen: Jayme Cloud., [name] Other Clinician: Referring Luqman Perrelli: Treating Domique Reardon/Extender: Crista Curb in Treatment: 28 Clinic Level of Care  Assessment Items TOOL 4 Quantity Score X- 1 0 Use when only an EandM is performed on FOLLOW-UP visit ASSESSMENTS - Nursing Assessment / Reassessment X- 1 10 Reassessment of Co-morbidities (includes updates in patient status) X- 1 5 Reassessment of Adherence to Treatment Plan ASSESSMENTS - Wound and Skin A ssessment / Reassessment X - Simple Wound Assessment / Reassessment - one wound 1 5 []  - 0 Complex Wound Assessment / Reassessment - multiple wounds []  - 0 Dermatologic / Skin Assessment (not related to wound area) ASSESSMENTS - Focused Assessment []  - 0 Circumferential Edema Measurements - multi extremities []  - 0 Nutritional Assessment / Counseling / Intervention []  - 0 Lower Extremity Assessment (monofilament, tuning fork, pulses) []  - 0 Peripheral Arterial Disease Assessment (using hand held doppler) ASSESSMENTS - Ostomy and/or Continence Assessment and Care []  - 0 Incontinence Assessment and Management []  - 0 Ostomy Care Assessment and Management (repouching, etc.) PROCESS - Coordination of Care X - Simple Patient / Family Education for ongoing care 1 15 Ranger, Woodmere R ([id number]) [phone].pdf Page 2 of 7 []  - 0 Complex (extensive) Patient / Family Education for ongoing care X- 1 10 Staff obtains Chiropractor, Records, T Results / Process Orders est []  - 0 Staff telephones HHA, Nursing Homes / Clarify orders / etc []  - 0 Routine Transfer to another Facility (non-emergent condition) []  - 0 Routine Hospital Admission (non-emergent condition) []  - 0 New Admissions / Manufacturing engineer / Ordering NPWT Apligraf, etc. , []  - 0 Emergency Hospital Admission (emergent condition) []  - 0 Simple Discharge Coordination []  - 0 Complex (extensive) Discharge Coordination PROCESS - Special Needs []  - 0 Pediatric / Minor Patient Management []  - 0 Isolation Patient Management []  - 0 Hearing / Language / Visual special needs []  -  0 Assessment of Community assistance (transportation, D/C planning, etc.) []  - 0 Additional assistance / Altered mentation []  - 0 Support Surface(s) Assessment (bed, cushion, seat, etc.) INTERVENTIONS -  Wound Cleansing / Measurement X - Simple Wound Cleansing - one wound 1 5 []  - 0 Complex Wound Cleansing - multiple wounds X- 1 5 Wound Imaging (photographs - any number of wounds) []  - 0 Wound Tracing (instead of photographs) X- 1 5 Simple Wound Measurement - one wound []  - 0 Complex Wound Measurement - multiple wounds INTERVENTIONS - Wound Dressings X - Small Wound Dressing one or multiple wounds 1 10 []  - 0 Medium Wound Dressing one or multiple wounds []  - 0 Large Wound Dressing one or multiple wounds []  - 0 Application of Medications - topical []  - 0 Application of Medications - injection INTERVENTIONS - Miscellaneous []  - 0 External ear exam []  - 0 Specimen Collection (cultures, biopsies, blood, body fluids, etc.) []  - 0 Specimen(s) / Culture(s) sent or taken to Lab for analysis []  - 0 Patient Transfer (multiple staff / Nurse, adult / Similar devices) []  - 0 Simple Staple / Suture removal (25 or less) []  - 0 Complex Staple / Suture removal (26 or more) []  - 0 Hypo / Hyperglycemic Management (close monitor of Blood Glucose) []  - 0 Ankle / Brachial Index (ABI) - do not check if billed separately X- 1 5 Vital Signs Has the patient been seen at the hospital within the last three years: Yes Total Score: 75 Level Of Care: New/Established - Level 2 Electronic Signature(s) Signed: 07/17/2022 4:03:05 PM By: Redmond Pulling RN, BSN Entered By: Redmond Pulling on 07/17/2022 11:55:01 [name] R ([id number]) [phone].pdf Page 3 of 7 -------------------------------------------------------------------------------- Encounter Discharge Information Details Patient Name: Date of Service: [name] 07/17/2022 11:00 A M Medical Record Number:  [id number] Patient Account Number: [id number] Date of Birth/Sex: Treating RN: [birth year] (72 y.o. Orville Govern Primary Care Pradyun Ishman: Jayme Cloud., [name] Other Clinician: Referring Emmary Culbreath: Treating Othelia Riederer/Extender: Crista Curb in Treatment: 28 Encounter Discharge Information Items Discharge Condition: Stable Ambulatory Status: Ambulatory Discharge Destination: Home Transportation: Private Auto Accompanied By: self Schedule Follow-up Appointment: Yes Clinical Summary of Care: Patient Declined Electronic Signature(s) Signed: 07/17/2022 4:03:05 PM By: Redmond Pulling RN, BSN Entered By: Redmond Pulling on 07/17/2022 11:55:48 -------------------------------------------------------------------------------- Lower Extremity Assessment Details Patient Name: Date of Service: [name] 07/17/2022 11:00 A M Medical Record Number: [id number] Patient Account Number: [id number] Date of Birth/Sex: Treating RN: 21-Feb-1951 (72 y.o. Orville Govern Primary Care Tramaine Sauls: Jayme Cloud., [name] Other Clinician: Referring Easton Sivertson: Treating Sandeep Radell/Extender: Crista Curb in Treatment: 28 Electronic Signature(s) Signed: 07/17/2022 4:03:05 PM By: Redmond Pulling RN, BSN Entered By: Redmond Pulling on 07/17/2022 11:10:05 -------------------------------------------------------------------------------- Multi-Disciplinary Care Plan Details Patient Name: Date of Service: [name] 07/17/2022 11:00 A M Medical Record Number: [id number] Patient Account Number: [id number] Date of Birth/Sex: Treating RN: 1950/07/27 (72 y.o. Orville Govern Primary Care Matthan Sledge: Jayme Cloud., [name] Other Clinician: Referring Jayjay Littles: Treating Contrell Ballentine/Extender: Crista Curb in Treatment: 28 Active Inactive Osteomyelitis Nursing Diagnoses: Infection: osteomyelitis Knowledge deficit related to disease process and  management Goals: Diagnostic evaluation for osteomyelitis completed as ordered Date Initiated: 07/03/2022 Target Resolution Date: 08/31/2022 Goal Status: Active Patient/caregiver will verbalize understanding of disease process and disease management Date Initiated: 07/03/2022 Target Resolution Date: 08/10/2022 Goal Status: Active Interventions: Assess for signs and symptoms of osteomyelitis resolution every visit BEONCA, NEWSUM ([id number]) [phone].pdf Page 4 of 7 Provide education on osteomyelitis Screen for HBO Treatment Activities: Consult for HBO : 07/03/2022 MRI : 06/26/2022 Systemic  antibiotics : 07/03/2022 White Blood Cell Scan : 07/03/2022 Notes: Pain, Acute or Chronic Nursing Diagnoses: Pain, acute or chronic: actual or potential Potential alteration in comfort, pain Goals: Patient will verbalize adequate pain control and receive pain control interventions during procedures as needed Date Initiated: 01/02/2022 Target Resolution Date: 08/09/2022 Goal Status: Active Patient/caregiver will verbalize adequate pain control between visits Date Initiated: 01/02/2022 Date Inactivated: 07/17/2022 Target Resolution Date: 07/13/2022 Goal Status: Met Interventions: Encourage patient to take pain medications as prescribed Provide education on pain management Reposition patient for comfort Treatment Activities: Administer pain control measures as ordered : 01/02/2022 Notes: Electronic Signature(s) Signed: 07/17/2022 4:03:05 PM By: Redmond Pulling RN, BSN Entered By: Redmond Pulling on 07/17/2022 11:54:03 -------------------------------------------------------------------------------- Pain Assessment Details Patient Name: Date of Service: [name] 07/17/2022 11:00 A M Medical Record Number: [id number] Patient Account Number: [id number] Date of Birth/Sex: Treating RN: 13-Jul-1950 (72 y.o. Orville Govern Primary Care Bodee Lafoe: Jayme Cloud., [name] Other  Clinician: Referring Lola Lofaro: Treating Lennette Fader/Extender: Crista Curb in Treatment: 28 Active Problems Location of Pain Severity and Description of Pain Patient Has Paino No Site Locations Pain Management and Medication MORRISON, MOERMAN R ([id number]) [phone].pdf Page 5 of 7 Current Pain Management: Electronic Signature(s) Signed: 07/17/2022 4:03:05 PM By: Redmond Pulling RN, BSN Entered By: Redmond Pulling on 07/17/2022 11:09:12 -------------------------------------------------------------------------------- Patient/Caregiver Education Details Patient Name: Date of Service: [name] 5/8/2024andnbsp11:00 A M Medical Record Number: [id number] Patient Account Number: [id number] Date of Birth/Gender: Treating RN: [birth year] (72 y.o. Orville Govern Primary Care Physician: Jayme Cloud., [name] Other Clinician: Referring Physician: Treating Physician/Extender: Crista Curb in Treatment: 28 Education Assessment Education Provided To: Patient Education Topics Provided Wound/Skin Impairment: Methods: Explain/Verbal Responses: State content correctly [employer]) Signed: 07/17/2022 4:03:05 PM By: Redmond Pulling RN, BSN Entered By: Redmond Pulling on 07/17/2022 11:22:55 -------------------------------------------------------------------------------- Wound Assessment Details Patient Name: Date of Service: [name] 07/17/2022 11:00 A M Medical Record Number: [id number] Patient Account Number: [id number] Date of Birth/Sex: Treating RN: Sep 06, 1950 (72 y.o. Orville Govern Primary Care Oluwadamilola Deliz: Jayme Cloud., [name] Other Clinician: Referring Maxson Oddo: Treating Maynard David/Extender: Crista Curb in Treatment: 28 Wound Status Wound Number: 1 Primary Etiology: Pressure Ulcer Wound Location: Sacrum Wound Status: Open Wounding Event: Pressure  Injury Comorbid History: Colitis Date Acquired: 10/09/2021 Weeks Of Treatment: 28 Clustered Wound: No Photos Wound Measurements Length: (cm) 0.5 Width: (cm) 0.4 Kataoka, Lashon R ([id number]) Depth: (cm) 0. Area: (cm) 0 Volume: (cm) 0 % Reduction in Area: 55.5% % Reduction in Volume: 42.9% [id number] Page 6 of 7 9 Tunneling: Yes .157 Position (o'clock): 1 .141 Maximum Distance: (cm) 1.7 Wound Description Classification: Category/Stage IV Exudate Amount: Medium Exudate Type: Serosanguineous Exudate Color: red, brown Foul Odor After Cleansing: No Slough/Fibrino No Wound Bed Granulation Amount: Large (67-100%) Exposed Structure Granulation Quality: Red Fat Layer (Subcutaneous Tissue) Exposed: Yes Necrotic Amount: None Present (0%) Periwound Skin Texture Texture Color No Abnormalities Noted: No No Abnormalities Noted: No Moisture No Abnormalities Noted: No Treatment Notes Wound #1 (Sacrum) Cleanser Normal Saline Discharge Instruction: Cleanse the wound with Normal Saline prior to applying a clean dressing using gauze sponges, not tissue or cotton balls. Vashe 5.8 (oz) Discharge Instruction: Cleanse the wound with Vashe prior to applying a clean dressing using gauze sponges, not tissue or cotton balls. Peri-Wound Care Skin Prep Discharge Instruction: Use skin prep as directed Topical Primary Dressing Hydrofera Blue Classic Foam  Rope Dressing, 9x6 (mm/in) Discharge Instruction: Moisten with saline prior to packing Secondary Dressing Woven Gauze Sponge, Non-Sterile 4x4 in Discharge Instruction: Apply over primary dressing as directed. Zetuvit Plus Silicone Border Dressing 7x7(in/in) Discharge Instruction: Apply silicone border over primary dressing as directed. Secured With Compression Wrap Compression Stockings Facilities manager) Signed: 07/17/2022 4:03:05 PM By: Redmond Pulling RN, BSN Entered By: Redmond Pulling on  07/17/2022 11:15:36 -------------------------------------------------------------------------------- Vitals Details Patient Name: Date of Service: [name] 07/17/2022 11:00 A M Medical Record Number: [id number] Patient Account Number: [id number] Date of Birth/Sex: Treating RN: 1950/07/05 (72 y.o. Orville Govern Primary Care Addilynn Mowrer: Jayme Cloud., [name] Other Clinician: Referring Annisten Manchester: Treating Rodell Marrs/Extender: Crista Curb in Treatment: [address] RYLYNNE, SAAM R ([id number]) [phone].pdf Page 7 of 7 Time Taken: 11:09 Temperature (F): 98.3 Height (in): 64 Pulse (bpm): 59 Weight (lbs): 97 Respiratory Rate (breaths/min): 16 Body Mass Index (BMI): 16.6 Blood Pressure (mmHg): 148/83 Reference Range: 80 - 120 mg / dl Electronic Signature(s) Signed: 07/17/2022 4:03:05 PM By: Redmond Pulling RN, BSN Entered By: Redmond Pulling on 07/17/2022 11:09:55

## 2022-07-18 NOTE — Progress Notes (Signed)
CONSTANZA, MENEAR R ([id number]) [id number] Page 1 of 1 Visit Report for 07/12/2022 SuperBill Details Patient Name: Date of Service: [name] 07/12/2022 Medical Record Number: [id number] Patient Account Number: [id number] Date of Birth/Sex: Treating RN: 1950/12/23 (72 y.o. [name], [name] Primary Care Provider: Jayme Cloud., [name] Other Clinician: Referring Provider: Treating Provider/Extender: Crissie Figures in Treatment: 27 Diagnosis Coding ICD-10 Codes Code Description [phone] Pressure ulcer of sacral region, stage 4 M62.81 Muscle weakness (generalized) E43 Unspecified severe protein-calorie malnutrition Facility Procedures CPT4 Code Description Modifier Quantity [id number] [phone] - WOUND VAC-50 SQ CM OR LESS 1 Electronic Signature(s) Signed: 07/15/2022 5:08:59 PM By: Shawn Stall RN, BSN Signed: 07/18/2022 12:25:07 PM By: Geralyn Corwin DO Entered By: Shawn Stall on 07/12/2022 13:14:31

## 2022-07-19 ENCOUNTER — Encounter (HOSPITAL_BASED_OUTPATIENT_CLINIC_OR_DEPARTMENT_OTHER): Payer: 59 | Admitting: Internal Medicine

## 2022-07-19 DIAGNOSIS — L89154 Pressure ulcer of sacral region, stage 4: Secondary | ICD-10-CM | POA: Diagnosis not present

## 2022-07-19 DIAGNOSIS — M8648 Chronic osteomyelitis with draining sinus, other site: Secondary | ICD-10-CM | POA: Diagnosis not present

## 2022-07-19 DIAGNOSIS — M6281 Muscle weakness (generalized): Secondary | ICD-10-CM | POA: Diagnosis not present

## 2022-07-19 DIAGNOSIS — E43 Unspecified severe protein-calorie malnutrition: Secondary | ICD-10-CM | POA: Diagnosis not present

## 2022-07-19 NOTE — Progress Notes (Signed)
[name] ([id number]) [id number] Page 1 of 5 Visit Report for 07/19/2022 Arrival Information Details Patient Name: Date of Service: [name] 07/19/2022 11:00 A M Medical Record Number: [id number] Patient Account Number: [id number] Date of Birth/Sex: Treating RN: 10/26/1950 (72 y.o. F) Primary Care [name]: Marissa Cloud., [name] Other Clinician: Referring [name]: Treating [name]/Extender: [name] in Treatment: 28 Visit Information History Since Last Visit Added or deleted any medications: No Patient Arrived: Ambulatory Any new allergies or adverse reactions: No Arrival Time: 11:31 Had a fall or experienced change in No Accompanied By: self activities of daily living that may affect Transfer Assistance: None risk of falls: Patient Identification Verified: Yes Signs or symptoms of abuse/neglect since last visito No Secondary Verification Process Completed: Yes Hospitalized since last visit: No Patient Requires Transmission-Based Precautions: No Implantable device outside of the clinic excluding No Patient Has Alerts: No cellular tissue based products placed in the center since last visit: Has Dressing in Place as Prescribed: Yes Pain Present Now: No Electronic Signature(s) Signed: 07/19/2022 1:52:56 PM By: [name] Entered By: [name] on 07/19/2022 11:31:54 -------------------------------------------------------------------------------- Clinic Level of Care Assessment Details Patient Name: Date of Service: [name] 07/19/2022 11:00 A M Medical Record Number: [id number] Patient Account Number: [id number] Date of Birth/Sex: Treating RN: 1950/09/23 (72 y.o. [name] Primary Care [name]: Marissa Cloud., [name] Other Clinician: Referring [name]: Treating [name]/Extender: Marissa Katz., [name] in Treatment: 28 Clinic Level of Care Assessment  Items TOOL 4 Quantity Score X- 1 0 Use when only an EandM is performed on FOLLOW-UP visit ASSESSMENTS - Nursing Assessment / Reassessment X- 1 10 Reassessment of Co-morbidities (includes updates in patient status) X- 1 5 Reassessment of Adherence to Treatment Plan ASSESSMENTS - Wound and Skin A ssessment / Reassessment X - Simple Wound Assessment / Reassessment - one wound 1 5 []  - 0 Complex Wound Assessment / Reassessment - multiple wounds []  - 0 Dermatologic / Skin Assessment (not related to wound area) ASSESSMENTS - Focused Assessment []  - 0 Circumferential Edema Measurements - multi extremities []  - 0 Nutritional Assessment / Counseling / Intervention []  - 0 Lower Extremity Assessment (monofilament, tuning fork, pulses) []  - 0 Peripheral Arterial Disease Assessment (using hand held doppler) ASSESSMENTS - Ostomy and/or Continence Assessment and Care []  - 0 Incontinence Assessment and Management []  - 0 Ostomy Care Assessment and Management (repouching, etc.) PROCESS - Coordination of Care X - Simple Patient / Family Education for ongoing care 1 [address], [place] Cardenas ([id number]) [id number] Page 2 of 5 []  - 0 Complex (extensive) Patient / Family Education for ongoing care X- 1 10 Staff obtains Chiropractor, Records, T Results / Process Orders est X- 1 10 Staff telephones HHA, Nursing Homes / Clarify orders / etc []  - 0 Routine Transfer to another Facility (non-emergent condition) []  - 0 Routine Hospital Admission (non-emergent condition) X- 1 15 New Admissions / Manufacturing engineer / Ordering NPWT Apligraf, etc. , []  - 0 Emergency Hospital Admission (emergent condition) X- 1 10 Simple Discharge Coordination []  - 0 Complex (extensive) Discharge Coordination PROCESS - Special Needs []  - 0 Pediatric / Minor Patient Management []  - 0 Isolation Patient Management []  - 0 Hearing / Language / Visual special needs []  - 0 Assessment of  Community assistance (transportation, D/C planning, etc.) []  - 0 Additional assistance / Altered mentation []  - 0 Support Surface(s) Assessment (bed, cushion, seat, etc.) INTERVENTIONS - Wound Cleansing / Measurement X -  Simple Wound Cleansing - one wound 1 5 []  - 0 Complex Wound Cleansing - multiple wounds X- 1 5 Wound Imaging (photographs - any number of wounds) []  - 0 Wound Tracing (instead of photographs) X- 1 5 Simple Wound Measurement - one wound []  - 0 Complex Wound Measurement - multiple wounds INTERVENTIONS - Wound Dressings X - Small Wound Dressing one or multiple wounds 1 10 []  - 0 Medium Wound Dressing one or multiple wounds []  - 0 Large Wound Dressing one or multiple wounds []  - 0 Application of Medications - topical []  - 0 Application of Medications - injection INTERVENTIONS - Miscellaneous []  - 0 External ear exam []  - 0 Specimen Collection (cultures, biopsies, blood, body fluids, etc.) []  - 0 Specimen(s) / Culture(s) sent or taken to Lab for analysis []  - 0 Patient Transfer (multiple staff / Nurse, adult / Similar devices) []  - 0 Simple Staple / Suture removal (25 or less) []  - 0 Complex Staple / Suture removal (26 or more) []  - 0 Hypo / Hyperglycemic Management (close monitor of Blood Glucose) []  - 0 Ankle / Brachial Index (ABI) - do not check if billed separately X- 1 5 Vital Signs Has the patient been seen at the hospital within the last three years: Yes Total Score: 110 Level Of Care: New/Established - Level 3 Electronic Signature(s) Signed: 07/19/2022 12:57:43 PM By: Marissa Schwalbe RN Entered By: [name] on 07/19/2022 12:34:04 [name] ([id number]) [id number] Page 3 of 5 -------------------------------------------------------------------------------- Encounter Discharge Information Details Patient Name: Date of Service: [name] 07/19/2022 11:00 A M Medical Record Number:  [id number] Patient Account Number: [id number] Date of Birth/Sex: Treating RN: 09-Apr-1950 (72 y.o. [name] Primary Care [name]: Marissa Cloud., [name] Other Clinician: Referring [name]: Treating [name]/Extender: Marissa Katz., [name] in Treatment: 28 Encounter Discharge Information Items Discharge Condition: Stable Ambulatory Status: Ambulatory Discharge Destination: Other (Note Required) Telephoned: No Orders Sent: Yes Transportation: Private Auto Accompanied By: self Schedule Follow-up Appointment: Yes Clinical Summary of Care: Patient Declined Electronic Signature(s) Signed: 07/19/2022 12:57:43 PM By: Marissa Schwalbe RN Entered By: [name] on 07/19/2022 12:35:02 -------------------------------------------------------------------------------- Patient/Caregiver Education Details Patient Name: Date of Service: [name] 5/10/2024andnbsp11:00 A M Medical Record Number: [id number] Patient Account Number: [id number] Date of Birth/Gender: Treating RN: 05/19/1950 (72 y.o. [name] Primary Care Physician: Marissa Cloud., [name] Other Clinician: Referring Physician: Treating Physician/Extender: Marissa Katz., [name] in Treatment: 28 Education Assessment Education Provided To: Patient Education Topics Provided Wound/Skin Impairment: Methods: Explain/Verbal Responses: Return demonstration correctly Electronic Signature(s) Signed: 07/19/2022 12:57:43 PM By: Marissa Schwalbe RN Entered By: [name] on 07/19/2022 12:34:23 -------------------------------------------------------------------------------- Wound Assessment Details Patient Name: Date of Service: CO Marissa Goody Cardenas. 07/19/2022 11:00 A M Medical Record Number: [id number] Patient Account Number: [id number] Date of Birth/Sex: Treating RN: Oct 01, 1950 (72 y.o. F) Primary Care Cedarius Kersh: Marissa Cloud., [name] Other Clinician: Referring  Rishav Rockefeller: Treating Maevyn Riordan/Extender: [name] in Treatment: 28 Wound Status Wound Number: 1 Primary Etiology: Pressure Ulcer Wound Location: Sacrum Wound Status: Open Wounding Event: Pressure Injury Date Acquired: 10/09/2021 RENETA, QIAO ([id number]) [phone] Page 4 of 5 Weeks Of Treatment: 28 Clustered Wound: No Wound Measurements Length: (cm) 0.5 Width: (cm) 0.4 Depth: (cm) 0.9 Area: (cm) 0.157 Volume: (cm) 0.141 % Reduction in Area: 55.5% % Reduction in Volume: 42.9% Wound Description Classification: Category/Stage IV Exudate Amount: Medium Exudate Type: Serosanguineous Exudate Color: red, brown Periwound Skin Texture Texture  Color No Abnormalities Noted: No No Abnormalities Noted: No Moisture No Abnormalities Noted: No Treatment Notes Wound #1 (Sacrum) Cleanser Normal Saline Discharge Instruction: Cleanse the wound with Normal Saline prior to applying a clean dressing using gauze sponges, not tissue or cotton balls. Vashe 5.8 (oz) Discharge Instruction: Cleanse the wound with Vashe prior to applying a clean dressing using gauze sponges, not tissue or cotton balls. Peri-Wound Care Skin Prep Discharge Instruction: Use skin prep as directed Topical Primary Dressing Hydrofera Blue Classic Foam Rope Dressing, 9x6 (mm/in) Discharge Instruction: Moisten with saline prior to packing Secondary Dressing Woven Gauze Sponge, Non-Sterile 4x4 in Discharge Instruction: Apply over primary dressing as directed. Zetuvit Plus Silicone Border Dressing 7x7(in/in) Discharge Instruction: Apply silicone border over primary dressing as directed. Secured With Compression Wrap Compression Stockings Facilities manager) Signed: 07/19/2022 1:52:56 PM By: [name] Entered By: [name] on 07/19/2022  11:32:18 -------------------------------------------------------------------------------- Vitals Details Patient Name: Date of Service: CO Marissa Goody Cardenas. 07/19/2022 11:00 A M Medical Record Number: [id number] Patient Account Number: [id number] Date of Birth/Sex: Treating RN: 10-14-1950 (72 y.o. F) Primary Care Kendel Bessey: Marissa Cloud., [name] Other Clinician: Referring Malique Driskill: Treating Jacari Kirsten/Extender: [name] in Treatment: [address] A[place], PERSICO Cardenas ([id number]) [phone].pdf Page 5 of 5 Time Taken: 11:31 Temperature (F): 98.1 Height (in): 64 Pulse (bpm): 60 Weight (lbs): 97 Respiratory Rate (breaths/min): 16 Body Mass Index (BMI): 16.6 Blood Pressure (mmHg): 148/83 Reference Range: 80 - 120 mg / dl Electronic Signature(s) Signed: 07/19/2022 1:52:56 PM By: [name] Entered By: [name] on 07/19/2022 11:32:08

## 2022-07-19 NOTE — Progress Notes (Signed)
ALEXANDRIA, NERONE R ([id number]) [id number] Page 1 of 1 Visit Report for 07/19/2022 SuperBill Details Patient Name: Date of Service: [name] 07/19/2022 Medical Record Number: [id number] Patient Account Number: [id number] Date of Birth/Sex: Treating RN: 1950/11/23 (72 y.o. [name] Primary Care Provider: Jayme Cloud., [name] Other Clinician: Referring Provider: Treating Provider/Extender: Allena Katz., [name] in Treatment: 28 Diagnosis Coding ICD-10 Codes Code Description [phone] Pressure ulcer of sacral region, stage 4 M62.81 Muscle weakness (generalized) E43 Unspecified severe protein-calorie malnutrition Facility Procedures CPT4 Code Description Modifier Quantity [id number] - WOUND CARE VISIT-LEV 3 EST PT 1 Electronic Signature(s) Signed: 07/19/2022 12:37:16 PM By: Geralyn Corwin DO Signed: 07/19/2022 12:57:43 PM By: Karie Schwalbe RN Entered By: Karie Schwalbe on 07/19/2022 12:35:33

## 2022-07-22 ENCOUNTER — Encounter (HOSPITAL_BASED_OUTPATIENT_CLINIC_OR_DEPARTMENT_OTHER): Payer: 59 | Admitting: Internal Medicine

## 2022-07-22 DIAGNOSIS — H44001 Unspecified purulent endophthalmitis, right eye: Secondary | ICD-10-CM | POA: Diagnosis not present

## 2022-07-22 DIAGNOSIS — M542 Cervicalgia: Secondary | ICD-10-CM | POA: Diagnosis not present

## 2022-07-22 DIAGNOSIS — Z79899 Other long term (current) drug therapy: Secondary | ICD-10-CM | POA: Diagnosis not present

## 2022-07-22 DIAGNOSIS — E43 Unspecified severe protein-calorie malnutrition: Secondary | ICD-10-CM | POA: Diagnosis not present

## 2022-07-22 DIAGNOSIS — M8648 Chronic osteomyelitis with draining sinus, other site: Secondary | ICD-10-CM | POA: Diagnosis not present

## 2022-07-22 DIAGNOSIS — G4700 Insomnia, unspecified: Secondary | ICD-10-CM | POA: Diagnosis not present

## 2022-07-22 DIAGNOSIS — F32A Depression, unspecified: Secondary | ICD-10-CM | POA: Diagnosis not present

## 2022-07-22 DIAGNOSIS — E2740 Unspecified adrenocortical insufficiency: Secondary | ICD-10-CM | POA: Diagnosis not present

## 2022-07-22 DIAGNOSIS — R030 Elevated blood-pressure reading, without diagnosis of hypertension: Secondary | ICD-10-CM | POA: Diagnosis not present

## 2022-07-22 DIAGNOSIS — Z681 Body mass index (BMI) 19 or less, adult: Secondary | ICD-10-CM | POA: Diagnosis not present

## 2022-07-22 DIAGNOSIS — M6281 Muscle weakness (generalized): Secondary | ICD-10-CM | POA: Diagnosis not present

## 2022-07-22 DIAGNOSIS — Z87891 Personal history of nicotine dependence: Secondary | ICD-10-CM | POA: Diagnosis not present

## 2022-07-22 DIAGNOSIS — J449 Chronic obstructive pulmonary disease, unspecified: Secondary | ICD-10-CM | POA: Diagnosis not present

## 2022-07-22 DIAGNOSIS — K861 Other chronic pancreatitis: Secondary | ICD-10-CM | POA: Diagnosis not present

## 2022-07-22 DIAGNOSIS — L89154 Pressure ulcer of sacral region, stage 4: Secondary | ICD-10-CM | POA: Diagnosis not present

## 2022-07-23 DIAGNOSIS — L89154 Pressure ulcer of sacral region, stage 4: Secondary | ICD-10-CM | POA: Diagnosis not present

## 2022-07-23 NOTE — Progress Notes (Signed)
ALVENA, RIANO ([id number]) [id number] Page 1 of 1 Visit Report for 07/22/2022 SuperBill Details Patient Name: Date of Service: [name] 07/22/2022 Medical Record Number: [id number] Patient Account Number: [id number] Date of Birth/Sex: Treating RN: 1951-02-22 (72 y.o. [name], [name] Primary Care Provider: Jayme Cloud., [name] Other Clinician: Referring Provider: Treating Provider/Extender: Crissie Figures in Treatment: 28 Diagnosis Coding ICD-10 Codes Code Description [phone] Pressure ulcer of sacral region, stage 4 M62.81 Muscle weakness (generalized) E43 Unspecified severe protein-calorie malnutrition Facility Procedures CPT4 Code Description Modifier Quantity [id number] [phone] - WOUND CARE VISIT-LEV 2 EST PT 1 Electronic Signature(s) Signed: 07/22/2022 2:08:31 PM By: Geralyn Corwin DO Signed: 07/22/2022 5:20:12 PM By: Shawn Stall RN, BSN Entered By: Shawn Stall on 07/22/2022 09:34:09

## 2022-07-24 ENCOUNTER — Ambulatory Visit (HOSPITAL_BASED_OUTPATIENT_CLINIC_OR_DEPARTMENT_OTHER): Payer: 59 | Admitting: Physician Assistant

## 2022-07-25 DIAGNOSIS — Z79899 Other long term (current) drug therapy: Secondary | ICD-10-CM | POA: Diagnosis not present

## 2022-07-26 ENCOUNTER — Encounter (HOSPITAL_BASED_OUTPATIENT_CLINIC_OR_DEPARTMENT_OTHER): Payer: 59 | Admitting: Internal Medicine

## 2022-07-26 DIAGNOSIS — M8648 Chronic osteomyelitis with draining sinus, other site: Secondary | ICD-10-CM | POA: Diagnosis not present

## 2022-07-26 DIAGNOSIS — E43 Unspecified severe protein-calorie malnutrition: Secondary | ICD-10-CM | POA: Diagnosis not present

## 2022-07-26 DIAGNOSIS — M6281 Muscle weakness (generalized): Secondary | ICD-10-CM | POA: Diagnosis not present

## 2022-07-26 DIAGNOSIS — R002 Palpitations: Secondary | ICD-10-CM | POA: Diagnosis not present

## 2022-07-26 DIAGNOSIS — L89154 Pressure ulcer of sacral region, stage 4: Secondary | ICD-10-CM | POA: Diagnosis not present

## 2022-07-29 ENCOUNTER — Encounter (HOSPITAL_BASED_OUTPATIENT_CLINIC_OR_DEPARTMENT_OTHER): Payer: 59 | Admitting: Internal Medicine

## 2022-07-29 DIAGNOSIS — M8648 Chronic osteomyelitis with draining sinus, other site: Secondary | ICD-10-CM | POA: Diagnosis not present

## 2022-07-29 DIAGNOSIS — E43 Unspecified severe protein-calorie malnutrition: Secondary | ICD-10-CM | POA: Diagnosis not present

## 2022-07-29 DIAGNOSIS — L89154 Pressure ulcer of sacral region, stage 4: Secondary | ICD-10-CM | POA: Diagnosis not present

## 2022-07-29 DIAGNOSIS — M6281 Muscle weakness (generalized): Secondary | ICD-10-CM | POA: Diagnosis not present

## 2022-07-29 NOTE — Progress Notes (Signed)
IJAH, VALOIS ([id number]) [id number] Page 1 of 1 Visit Report for 07/26/2022 SuperBill Details Patient Name: Date of Service: [name] 07/26/2022 Medical Record Number: [id number] Patient Account Number: [id number] Date of Birth/Sex: Treating RN: 01/01/1951 (72 y.o. [name], [name] Primary Care Provider: Jayme Cloud., [name] Other Clinician: Referring Provider: Treating Provider/Extender: Crissie Figures in Treatment: 29 Diagnosis Coding ICD-10 Codes Code Description [phone] Pressure ulcer of sacral region, stage 4 M62.81 Muscle weakness (generalized) E43 Unspecified severe protein-calorie malnutrition Facility Procedures CPT4 Code Description Modifier Quantity [id number] - WOUND CARE VISIT-LEV 3 EST PT 1 Electronic Signature(s) Signed: 07/26/2022 4:43:27 PM By: Shawn Stall RN, BSN Signed: 07/29/2022 2:02:21 PM By: Geralyn Corwin DO Entered By: Shawn Stall on 07/26/2022 16:42:33

## 2022-07-30 NOTE — Progress Notes (Signed)
AUZHANE, SLUTSKY ([id number]) [id number] Page 1 of 1 Visit Report for 07/29/2022 SuperBill Details Patient Name: Date of Service: [name] 07/29/2022 Medical Record Number: [id number] Patient Account Number: [id number] Date of Birth/Sex: Treating RN: March 09, 1951 (72 y.o. [name], [name] Primary Care Provider: Jayme Cloud., [name] Other Clinician: Referring Provider: Treating Provider/Extender: Crissie Figures in Treatment: 29 Diagnosis Coding ICD-10 Codes Code Description [phone] Pressure ulcer of sacral region, stage 4 M62.81 Muscle weakness (generalized) E43 Unspecified severe protein-calorie malnutrition Facility Procedures CPT4 Code Description Modifier Quantity [id number] [phone] - WOUND CARE VISIT-LEV 3 EST PT 1 Electronic Signature(s) Signed: 07/29/2022 2:02:21 PM By: Geralyn Corwin DO Signed: 07/29/2022 6:19:31 PM By: Shawn Stall RN, BSN Entered By: Shawn Stall on 07/29/2022 12:09:08

## 2022-07-31 ENCOUNTER — Encounter (HOSPITAL_BASED_OUTPATIENT_CLINIC_OR_DEPARTMENT_OTHER): Payer: 59 | Admitting: Physician Assistant

## 2022-07-31 DIAGNOSIS — M8648 Chronic osteomyelitis with draining sinus, other site: Secondary | ICD-10-CM | POA: Diagnosis not present

## 2022-07-31 DIAGNOSIS — E43 Unspecified severe protein-calorie malnutrition: Secondary | ICD-10-CM | POA: Diagnosis not present

## 2022-07-31 DIAGNOSIS — M6281 Muscle weakness (generalized): Secondary | ICD-10-CM | POA: Diagnosis not present

## 2022-07-31 DIAGNOSIS — L89154 Pressure ulcer of sacral region, stage 4: Secondary | ICD-10-CM | POA: Diagnosis not present

## 2022-08-02 ENCOUNTER — Encounter (HOSPITAL_BASED_OUTPATIENT_CLINIC_OR_DEPARTMENT_OTHER): Payer: 59 | Admitting: Internal Medicine

## 2022-08-02 DIAGNOSIS — L89154 Pressure ulcer of sacral region, stage 4: Secondary | ICD-10-CM | POA: Diagnosis not present

## 2022-08-02 DIAGNOSIS — M6281 Muscle weakness (generalized): Secondary | ICD-10-CM | POA: Diagnosis not present

## 2022-08-02 DIAGNOSIS — M8648 Chronic osteomyelitis with draining sinus, other site: Secondary | ICD-10-CM | POA: Diagnosis not present

## 2022-08-02 DIAGNOSIS — E43 Unspecified severe protein-calorie malnutrition: Secondary | ICD-10-CM | POA: Diagnosis not present

## 2022-08-02 NOTE — Progress Notes (Signed)
SEJAL, BAREFIELD R ([id number]) [id number] Page 1 of 1 Visit Report for 08/02/2022 SuperBill Details Patient Name: Date of Service: [name] 08/02/2022 Medical Record Number: [id number] Patient Account Number: [id number] Date of Birth/Sex: Treating RN: 05/06/1950 (72 y.o. Orville Govern Primary Care Provider: Jayme Cloud., [name] Other Clinician: Referring Provider: Treating Provider/Extender: Allena Katz., [name] in Treatment: 30 Diagnosis Coding ICD-10 Codes Code Description [phone] Pressure ulcer of sacral region, stage 4 M86.48 Chronic osteomyelitis with draining sinus, other site M62.81 Muscle weakness (generalized) E43 Unspecified severe protein-calorie malnutrition Facility Procedures CPT4 Code Description Modifier Quantity [id number] [phone] - WOUND CARE VISIT-LEV 1 EST PT 1 Electronic Signature(s) Signed: 08/02/2022 11:26:15 AM By: Geralyn Corwin DO Signed: 08/02/2022 12:42:14 PM By: Redmond Pulling RN, BSN Entered By: Redmond Pulling on 08/02/2022 09:09:58

## 2022-08-07 ENCOUNTER — Encounter (HOSPITAL_BASED_OUTPATIENT_CLINIC_OR_DEPARTMENT_OTHER): Payer: 59 | Admitting: Physician Assistant

## 2022-08-07 DIAGNOSIS — M6281 Muscle weakness (generalized): Secondary | ICD-10-CM | POA: Diagnosis not present

## 2022-08-07 DIAGNOSIS — E43 Unspecified severe protein-calorie malnutrition: Secondary | ICD-10-CM | POA: Diagnosis not present

## 2022-08-07 DIAGNOSIS — L89154 Pressure ulcer of sacral region, stage 4: Secondary | ICD-10-CM | POA: Diagnosis not present

## 2022-08-07 DIAGNOSIS — M8648 Chronic osteomyelitis with draining sinus, other site: Secondary | ICD-10-CM | POA: Diagnosis not present

## 2022-08-09 ENCOUNTER — Encounter (HOSPITAL_BASED_OUTPATIENT_CLINIC_OR_DEPARTMENT_OTHER): Payer: 59 | Admitting: Internal Medicine

## 2022-08-09 DIAGNOSIS — M6281 Muscle weakness (generalized): Secondary | ICD-10-CM | POA: Diagnosis not present

## 2022-08-09 DIAGNOSIS — L89154 Pressure ulcer of sacral region, stage 4: Secondary | ICD-10-CM | POA: Diagnosis not present

## 2022-08-09 DIAGNOSIS — M8648 Chronic osteomyelitis with draining sinus, other site: Secondary | ICD-10-CM | POA: Diagnosis not present

## 2022-08-09 DIAGNOSIS — E43 Unspecified severe protein-calorie malnutrition: Secondary | ICD-10-CM | POA: Diagnosis not present

## 2022-08-10 NOTE — Progress Notes (Signed)
JUNELLE, GANE ([id number]) [id number] Page 1 of 1 Visit Report for 08/09/2022 SuperBill Details Patient Name: Date of Service: [name] 08/09/2022 Medical Record Number: [id number] Patient Account Number: [id number] Date of Birth/Sex: Treating RN: 11-02-1950 (72 y.o. F) Primary Care Provider: Jayme Cloud., [name] Other Clinician: Referring Provider: Treating Provider/Extender: Crissie Figures in Treatment: 31 Diagnosis Coding ICD-10 Codes Code Description [phone] Pressure ulcer of sacral region, stage 4 M86.48 Chronic osteomyelitis with draining sinus, other site M62.81 Muscle weakness (generalized) E43 Unspecified severe protein-calorie malnutrition Facility Procedures CPT4 Code Description Modifier Quantity [id number] [phone] - WOUND CARE VISIT-LEV 2 EST PT 1 Electronic Signature(s) Signed: 08/09/2022 11:38:28 AM By: Thayer Dallas Signed: 08/09/2022 12:18:22 PM By: Geralyn Corwin DO Entered By: Thayer Dallas on 08/09/2022 11:37:48

## 2022-08-12 ENCOUNTER — Encounter (HOSPITAL_BASED_OUTPATIENT_CLINIC_OR_DEPARTMENT_OTHER): Payer: 59 | Attending: Internal Medicine | Admitting: Internal Medicine

## 2022-08-12 DIAGNOSIS — E43 Unspecified severe protein-calorie malnutrition: Secondary | ICD-10-CM | POA: Insufficient documentation

## 2022-08-12 DIAGNOSIS — Z681 Body mass index (BMI) 19 or less, adult: Secondary | ICD-10-CM | POA: Insufficient documentation

## 2022-08-12 DIAGNOSIS — L89154 Pressure ulcer of sacral region, stage 4: Secondary | ICD-10-CM | POA: Insufficient documentation

## 2022-08-12 DIAGNOSIS — H0100A Unspecified blepharitis right eye, upper and lower eyelids: Secondary | ICD-10-CM | POA: Diagnosis not present

## 2022-08-12 DIAGNOSIS — M8648 Chronic osteomyelitis with draining sinus, other site: Secondary | ICD-10-CM | POA: Insufficient documentation

## 2022-08-12 DIAGNOSIS — L89153 Pressure ulcer of sacral region, stage 3: Secondary | ICD-10-CM | POA: Diagnosis not present

## 2022-08-12 DIAGNOSIS — M6281 Muscle weakness (generalized): Secondary | ICD-10-CM | POA: Insufficient documentation

## 2022-08-13 DIAGNOSIS — L89154 Pressure ulcer of sacral region, stage 4: Secondary | ICD-10-CM | POA: Diagnosis not present

## 2022-08-13 NOTE — Progress Notes (Signed)
LEVONE, DESORBO R ([id number]) [id number] Page 1 of 1 Visit Report for 08/12/2022 SuperBill Details Patient Name: Date of Service: [name] 08/12/2022 Medical Record Number: [id number] Patient Account Number: [id number] Date of Birth/Sex: Treating RN: Feb 13, 1951 (72 y.o. [name], [name] Primary Care Provider: Jayme Cloud., [name] Other Clinician: Referring Provider: Treating Provider/Extender: Doyce Para., [name] in Treatment: 31 Diagnosis Coding ICD-10 Codes Code Description [phone] Pressure ulcer of sacral region, stage 4 M86.48 Chronic osteomyelitis with draining sinus, other site M62.81 Muscle weakness (generalized) E43 Unspecified severe protein-calorie malnutrition Facility Procedures CPT4 Code Description Modifier Quantity [id number] [phone] - WOUND CARE VISIT-LEV 2 EST PT 1 Electronic Signature(s) Signed: 08/12/2022 4:55:10 PM By: Shawn Stall RN, BSN Signed: 08/13/2022 7:23:00 AM By: Baltazar Najjar MD Entered By: Shawn Stall on 08/12/2022 09:19:16

## 2022-08-14 ENCOUNTER — Encounter (HOSPITAL_BASED_OUTPATIENT_CLINIC_OR_DEPARTMENT_OTHER): Payer: 59 | Admitting: Internal Medicine

## 2022-08-14 ENCOUNTER — Encounter (HOSPITAL_BASED_OUTPATIENT_CLINIC_OR_DEPARTMENT_OTHER): Payer: 59 | Admitting: Physician Assistant

## 2022-08-14 DIAGNOSIS — M8648 Chronic osteomyelitis with draining sinus, other site: Secondary | ICD-10-CM | POA: Diagnosis not present

## 2022-08-14 DIAGNOSIS — Z681 Body mass index (BMI) 19 or less, adult: Secondary | ICD-10-CM | POA: Diagnosis not present

## 2022-08-14 DIAGNOSIS — L89154 Pressure ulcer of sacral region, stage 4: Secondary | ICD-10-CM | POA: Diagnosis not present

## 2022-08-14 DIAGNOSIS — L89153 Pressure ulcer of sacral region, stage 3: Secondary | ICD-10-CM | POA: Diagnosis not present

## 2022-08-14 DIAGNOSIS — E43 Unspecified severe protein-calorie malnutrition: Secondary | ICD-10-CM | POA: Diagnosis not present

## 2022-08-14 DIAGNOSIS — M6281 Muscle weakness (generalized): Secondary | ICD-10-CM | POA: Diagnosis not present

## 2022-08-14 NOTE — Progress Notes (Signed)
[name], [name] ([id number]) [id number] Page 1 of 3 Visit Report for 08/14/2022 Chief Complaint Document Details Patient Name: Date of Service: Marissa Lou. 08/14/2022 9:30 A M Medical Record Number: [id number] Patient Account Number: [id number] Date of Birth/Sex: Treating RN: [birth year] (72 y.o. F) Primary Care Provider: Jayme Cloud., [name] Other Clinician: Referring Provider: Treating Provider/Extender: [name] in Treatment: 32 Information Obtained from: Patient Chief Complaint Pressure ulcer stage 3 Sacrum Electronic Signature(s) Signed: 08/14/2022 9:18:11 AM By: Allen Derry PA-C Entered By: Allen Derry on 08/14/2022 09:18:11 -------------------------------------------------------------------------------- Physician Orders Details Patient Name: Date of Service: CO Marissa Goody Cardenas. 08/14/2022 9:30 A M Medical Record Number: [id number] Patient Account Number: [id number] Date of Birth/Sex: Treating RN: 1950/04/03 (72 y.o. [name], [name] Primary Care Provider: Jayme Cloud., [name] Other Clinician: Referring Provider: Treating Provider/Extender: [name] in Treatment: [phone] Verbal / Phone Orders: No Diagnosis Coding ICD-10 Coding Code Description L89.154 Pressure ulcer of sacral region, stage 4 M86.48 Chronic osteomyelitis with draining sinus, other site M62.81 Muscle weakness (generalized) E43 Unspecified severe protein-calorie malnutrition Follow-up Appointments ppointment in 1 week. Allen Derry III PA-C Room 8 1100 08/21/2022 Return A Nurse Visit: - Twice a week Mondays 0745 and Fridays 1130 Other: - will see you three times a week with dressing changes till you have your surgery with Dr. Roxan Hockey then he will take over wound care. Can cancel wound care appts when surgery is planned. Anesthetic (In clinic) Topical Lidocaine 4% applied to wound bed Negative Presssure Wound  Therapy Discontinue wound vac. Call the number on the machine for the company to pick up. Off-Loading Turn and reposition every 2 hours - when sitting. walk hourly to aid in offloading pressure to buttock. Other: - PCP at Wound Clinic talked with pateint regarding possible Hyperbaric Oxygen Therapy. (08/07/22) Wound Treatment Wound #1 - Sacrum Cleanser: Normal Saline 3 x Per Week/30 Days Discharge Instructions: Cleanse the wound with Normal Saline prior to applying a clean dressing using gauze sponges, not tissue or cotton balls. Cleanser: Vashe 5.8 (oz) 3 x Per Week/30 Days Discharge Instructions: Cleanse the wound with Vashe prior to applying a clean dressing using gauze sponges, not tissue or cotton balls. Peri-Wound Care: Skin Prep 3 x Per Week/30 Days Discharge Instructions: Use skin prep as directed Prim Dressing: Endoform 2x2 in ary 3 x Per Week/30 Days [name], [name] ([id number]) [phone] Page 2 of 3 Discharge Instructions: Moisten with saline Secondary Dressing: Woven Gauze Sponge, Non-Sterile 4x4 in 3 x Per Week/30 Days Discharge Instructions: Apply over primary dressing as directed. Secondary Dressing: Zetuvit Plus Silicone Border Dressing 7x7(in/in) 3 x Per Week/30 Days Discharge Instructions: Apply silicone border over primary dressing as directed. Electronic Signature(s) Unsigned Entered By: Shawn Stall on 08/14/2022 10:02:33 -------------------------------------------------------------------------------- Problem List Details Patient Name: Date of Service: [name]. 08/14/2022 9:30 A M Medical Record Number: [id number] Patient Account Number: [id number] Date of Birth/Sex: Treating RN: [birth year] (72 y.o. F) Primary Care Provider: Jayme Cloud., [name] Other Clinician: Referring Provider: Treating Provider/Extender: [name] in Treatment: 32 Active Problems ICD-10 Encounter Code Description  Active Date MDM Diagnosis L89.154 Pressure ulcer of sacral region, stage 4 01/02/2022 No Yes M86.48 Chronic osteomyelitis with draining sinus, other site 07/31/2022 No Yes M62.81 Muscle weakness (generalized) 01/02/2022 No Yes E43 Unspecified severe protein-calorie malnutrition 01/02/2022 No Yes Inactive Problems Resolved Problems Electronic Signature(s) Signed: 08/14/2022 9:17:34 AM By: Allen Derry  PA-C Entered By: Allen Derry on 08/14/2022 09:17:33 -------------------------------------------------------------------------------- SuperBill Details Patient Name: Date of Service: [name] 08/14/2022 Medical Record Number: [id number] Patient Account Number: [id number] Date of Birth/Sex: Treating RN: 08/01/1950 (72 y.o. [name], [name] Primary Care Provider: Jayme Cloud., [name] Other Clinician: Referring Provider: Treating Provider/Extender: [name] in Treatment: [address] [name], [name] ([id number]) [id number] Page 3 of 3 ICD-10 Codes Code Description L89.154 Pressure ulcer of sacral region, stage 4 M86.48 Chronic osteomyelitis with draining sinus, other site M62.81 Muscle weakness (generalized) E43 Unspecified severe protein-calorie malnutrition Facility Procedures : CPT4 Code: [id number] Description: 99213 - WOUND CARE VISIT-LEV 3 EST PT Modifier: Quantity: 1 Electronic Signature(s) Unsigned Entered By: Shawn Stall on 08/14/2022 11:22:40 Signature(s): Date(s):

## 2022-08-16 ENCOUNTER — Encounter (HOSPITAL_BASED_OUTPATIENT_CLINIC_OR_DEPARTMENT_OTHER): Payer: 59 | Admitting: Internal Medicine

## 2022-08-16 DIAGNOSIS — E43 Unspecified severe protein-calorie malnutrition: Secondary | ICD-10-CM | POA: Diagnosis not present

## 2022-08-16 DIAGNOSIS — L89153 Pressure ulcer of sacral region, stage 3: Secondary | ICD-10-CM | POA: Diagnosis not present

## 2022-08-16 DIAGNOSIS — M8648 Chronic osteomyelitis with draining sinus, other site: Secondary | ICD-10-CM | POA: Diagnosis not present

## 2022-08-16 DIAGNOSIS — Z681 Body mass index (BMI) 19 or less, adult: Secondary | ICD-10-CM | POA: Diagnosis not present

## 2022-08-16 DIAGNOSIS — M6281 Muscle weakness (generalized): Secondary | ICD-10-CM | POA: Diagnosis not present

## 2022-08-16 DIAGNOSIS — L89154 Pressure ulcer of sacral region, stage 4: Secondary | ICD-10-CM | POA: Diagnosis not present

## 2022-08-17 NOTE — Progress Notes (Signed)
[name], [name] ([id number]) [id number] Page 1 of 1 Visit Report for 08/16/2022 SuperBill Details Patient Name: Date of Service: [name] 08/16/2022 Medical Record Number: [id number] Patient Account Number: [id number] Date of Birth/Sex: Treating RN: [birth year] (72 y.o. [name], [name] Primary Care Provider: Jayme Cloud., [name] Other Clinician: Referring Provider: Treating Provider/Extender: Doyce Para., [name] in Treatment: 32 Diagnosis Coding ICD-10 Codes Code Description [phone] Pressure ulcer of sacral region, stage 4 M86.48 Chronic osteomyelitis with draining sinus, other site M62.81 Muscle weakness (generalized) E43 Unspecified severe protein-calorie malnutrition Facility Procedures CPT4 Code Description Modifier Quantity [id number] [phone] - WOUND CARE VISIT-LEV 2 EST PT 1 Electronic Signature(s) Signed: 08/16/2022 3:47:54 PM By: Shawn Stall RN, BSN Signed: 08/16/2022 4:34:22 PM By: Baltazar Najjar MD Entered By: Shawn Stall on 08/16/2022 10:45:07

## 2022-08-19 ENCOUNTER — Encounter (HOSPITAL_BASED_OUTPATIENT_CLINIC_OR_DEPARTMENT_OTHER): Payer: 59 | Admitting: Internal Medicine

## 2022-08-19 DIAGNOSIS — M6281 Muscle weakness (generalized): Secondary | ICD-10-CM | POA: Diagnosis not present

## 2022-08-19 DIAGNOSIS — L89153 Pressure ulcer of sacral region, stage 3: Secondary | ICD-10-CM | POA: Diagnosis not present

## 2022-08-19 DIAGNOSIS — E43 Unspecified severe protein-calorie malnutrition: Secondary | ICD-10-CM | POA: Diagnosis not present

## 2022-08-19 DIAGNOSIS — Z681 Body mass index (BMI) 19 or less, adult: Secondary | ICD-10-CM | POA: Diagnosis not present

## 2022-08-19 DIAGNOSIS — L89154 Pressure ulcer of sacral region, stage 4: Secondary | ICD-10-CM | POA: Diagnosis not present

## 2022-08-19 DIAGNOSIS — M8648 Chronic osteomyelitis with draining sinus, other site: Secondary | ICD-10-CM | POA: Diagnosis not present

## 2022-08-20 NOTE — Progress Notes (Signed)
[name] ([id number]) [id number] Page 1 of 4 Visit Report for 08/12/2022 Arrival Information Details Patient Name: Date of Service: [name] 08/12/2022 8:15 A M Medical Record Number: [id number] Patient Account Number: [id number] Date of Birth/Sex: Treating RN: 1950/04/15 (72 y.o. F) Primary Care Darrick Greenlaw: Jayme Cloud., [name] Other Clinician: Referring Millee Denise: Treating Divina Neale/Extender: Leo Grosser in Treatment: 31 Visit Information History Since Last Visit All ordered tests and consults were completed: Yes Patient Arrived: Ambulatory Added or deleted any medications: No Arrival Time: 08:46 Any new allergies or adverse reactions: No Accompanied By: self Had a fall or experienced change in No Transfer Assistance: None activities of daily living that may affect Patient Identification Verified: Yes risk of falls: Secondary Verification Process Completed: Yes Signs or symptoms of abuse/neglect since last visito No Patient Requires Transmission-Based Precautions: No Hospitalized since last visit: No Patient Has Alerts: No Implantable device outside of the clinic excluding No cellular tissue based products placed in the center since last visit: Has Dressing in Place as Prescribed: Yes Pain Present Now: No Electronic Signature(s) Signed: 08/19/2022 6:09:57 PM By: Haywood Pao CHT EMT BS , , Entered By: Haywood Pao on 08/12/2022 08:47:35 -------------------------------------------------------------------------------- Clinic Level of Care Assessment Details Patient Name: Date of Service: [name] 08/12/2022 8:15 A M Medical Record Number: [id number] Patient Account Number: [id number] Date of Birth/Sex: Treating RN: November 18, 1950 (72 y.o. [name], [name] Primary Care Kalonji Zurawski: Jayme Cloud., [name] Other Clinician: Referring Dhanya Bogle: Treating Obediah Welles/Extender: Doyce Para.,  [name] in Treatment: 31 Clinic Level of Care Assessment Items TOOL 4 Quantity Score X- 1 0 Use when only an EandM is performed on FOLLOW-UP visit ASSESSMENTS - Nursing Assessment / Reassessment X- 1 10 Reassessment of Co-morbidities (includes updates in patient status) X- 1 5 Reassessment of Adherence to Treatment Plan ASSESSMENTS - Wound and Skin A ssessment / Reassessment X - Simple Wound Assessment / Reassessment - one wound 1 5 []  - 0 Complex Wound Assessment / Reassessment - multiple wounds []  - 0 Dermatologic / Skin Assessment (not related to wound area) ASSESSMENTS - Focused Assessment []  - 0 Circumferential Edema Measurements - multi extremities []  - 0 Nutritional Assessment / Counseling / Intervention []  - 0 Lower Extremity Assessment (monofilament, tuning fork, pulses) []  - 0 Peripheral Arterial Disease Assessment (using hand held doppler) ASSESSMENTS - Ostomy and/or Continence Assessment and Care []  - 0 Incontinence Assessment and Management []  - 0 Ostomy Care Assessment and Management (repouching, etc.) PROCESS - Coordination of Care RHETA, DADE Cardenas ([id number]) [id number] Page 2 of 4 X- 1 15 Simple Patient / Family Education for ongoing care []  - 0 Complex (extensive) Patient / Family Education for ongoing care X- 1 10 Staff obtains Chiropractor, Records, T Results / Process Orders est []  - 0 Staff telephones HHA, Nursing Homes / Clarify orders / etc []  - 0 Routine Transfer to another Facility (non-emergent condition) []  - 0 Routine Hospital Admission (non-emergent condition) []  - 0 New Admissions / Manufacturing engineer / Ordering NPWT Apligraf, etc. , []  - 0 Emergency Hospital Admission (emergent condition) X- 1 10 Simple Discharge Coordination []  - 0 Complex (extensive) Discharge Coordination PROCESS - Special Needs []  - 0 Pediatric / Minor Patient Management []  - 0 Isolation Patient Management []  -  0 Hearing / Language / Visual special needs []  - 0 Assessment of Community assistance (transportation, D/C planning, etc.) []  - 0 Additional assistance / Altered mentation []  - 0 Support Surface(s)  Assessment (bed, cushion, seat, etc.) INTERVENTIONS - Wound Cleansing / Measurement X - Simple Wound Cleansing - one wound 1 5 []  - 0 Complex Wound Cleansing - multiple wounds []  - 0 Wound Imaging (photographs - any number of wounds) []  - 0 Wound Tracing (instead of photographs) []  - 0 Simple Wound Measurement - one wound []  - 0 Complex Wound Measurement - multiple wounds INTERVENTIONS - Wound Dressings X - Small Wound Dressing one or multiple wounds 1 10 []  - 0 Medium Wound Dressing one or multiple wounds []  - 0 Large Wound Dressing one or multiple wounds []  - 0 Application of Medications - topical []  - 0 Application of Medications - injection INTERVENTIONS - Miscellaneous []  - 0 External ear exam []  - 0 Specimen Collection (cultures, biopsies, blood, body fluids, etc.) []  - 0 Specimen(s) / Culture(s) sent or taken to Lab for analysis []  - 0 Patient Transfer (multiple staff / Nurse, adult / Similar devices) []  - 0 Simple Staple / Suture removal (25 or less) []  - 0 Complex Staple / Suture removal (26 or more) []  - 0 Hypo / Hyperglycemic Management (close monitor of Blood Glucose) []  - 0 Ankle / Brachial Index (ABI) - do not check if billed separately X- 1 5 Vital Signs Has the patient been seen at the hospital within the last three years: Yes Total Score: 75 Level Of Care: New/Established - Level 2 Electronic Signature(s) Signed: 08/12/2022 4:55:10 PM By: Shawn Stall RN, BSN Huson, Ninetta Lights Cardenas ([id number]) [id number] Page 3 of 4 Entered By: Shawn Stall on 08/12/2022 09:19:11 -------------------------------------------------------------------------------- Encounter Discharge Information Details Patient Name: Date of Service: [name] 08/12/2022 8:15 A M Medical Record Number: [id number] Patient Account Number: [id number] Date of Birth/Sex: Treating RN: 07/11/1950 (72 y.o. [name], [name] Primary Care Judit Awad: Jayme Cloud., [name] Other Clinician: Referring Heaven Meeker: Treating Mayari Matus/Extender: Doyce Para., [name] in Treatment: 31 Encounter Discharge Information Items Discharge Condition: Stable Ambulatory Status: Ambulatory Discharge Destination: Home Transportation: Private Auto Accompanied By: self Schedule Follow-up Appointment: Yes Clinical Summary of Care: Electronic Signature(s) Signed: 08/12/2022 4:55:10 PM By: Shawn Stall RN, BSN Entered By: Shawn Stall on 08/12/2022 09:18:44 -------------------------------------------------------------------------------- Wound Assessment Details Patient Name: Date of Service: Filbert Berthold Cardenas. 08/12/2022 8:15 A M Medical Record Number: [id number] Patient Account Number: [id number] Date of Birth/Sex: Treating RN: Aug 03, 1950 (72 y.o. [name], [name] Primary Care Saia Derossett: Jayme Cloud., [name] Other Clinician: Referring Ritchie Klee: Treating [place] Rosamond/Extender: Doyce Para., [name] in Treatment: 31 Wound Status Wound Number: 1 Primary Etiology: Pressure Ulcer Wound Location: Sacrum Wound Status: Open Wounding Event: Pressure Injury Date Acquired: 10/09/2021 Weeks Of Treatment: 31 Clustered Wound: No Wound Measurements Length: (cm) 0.3 Width: (cm) 0.2 Depth: (cm) 1 Area: (cm) 0.047 Volume: (cm) 0.047 % Reduction in Area: 86.7% % Reduction in Volume: 81% Wound Description Classification: Category/Stage IV Exudate Amount: Medium Exudate Type: Serosanguineous Exudate Color: red, brown Periwound Skin Texture Texture Color No Abnormalities Noted: No No Abnormalities Noted: No Moisture No Abnormalities Noted: No Electronic Signature(s) Signed: 08/12/2022 4:55:10 PM By: Shawn Stall RN, BSN Entered By: Shawn Stall  on 08/12/2022 09:14:54 [name] ([id number]) [id number] Page 4 of 4 -------------------------------------------------------------------------------- Vitals Details Patient Name: Date of Service: [name] 08/12/2022 8:15 A M Medical Record Number: [id number] Patient Account Number: [id number] Date of Birth/Sex: Treating RN: [birth year] (72 y.o. F) Primary Care Harvest Stanco: Jayme Cloud., [name] Other Clinician: Referring Jayliah Benett: Treating Aleister Lady/Extender: Mellody Life  Weeks in Treatment: 31 Vital Signs Time Taken: 08:45 Temperature (F): 98.4 Height (in): 64 Pulse (bpm): 67 Weight (lbs): 97 Respiratory Rate (breaths/min): 18 Body Mass Index (BMI): 16.6 Blood Pressure (mmHg): 125/81 Reference Range: 80 - 120 mg / dl Electronic Signature(s) Signed: 08/19/2022 6:09:57 PM By: Haywood Pao CHT EMT BS , , Entered By: Haywood Pao on 08/12/2022 08:49:11

## 2022-08-20 NOTE — Progress Notes (Signed)
DANAY, MCKELLAR R ([id number]) [id number] Page 1 of 3 Visit Report for 08/19/2022 Arrival Information Details Patient Name: Date of Service: [name] 08/19/2022 7:45 A M Medical Record Number: [id number] Patient Account Number: [id number] Date of Birth/Sex: Treating RN: [birth year] (72 y.o. [name] Primary Care Oriana Horiuchi: Jayme Cloud., [name] Other Clinician: Referring Harvis Mabus: Treating Nikkia Devoss/Extender: Allena Katz., [name] in Treatment: 32 Visit Information History Since Last Visit All ordered tests and consults were completed: Yes Patient Arrived: Ambulatory Added or deleted any medications: No Arrival Time: 08:25 Any new allergies or adverse reactions: No Accompanied By: self Had a fall or experienced change in No Transfer Assistance: None activities of daily living that may affect Patient Requires Transmission-Based Precautions: No risk of falls: Patient Has Alerts: No Signs or symptoms of abuse/neglect since last visito No Hospitalized since last visit: No Implantable device outside of the clinic excluding No cellular tissue based products placed in the center since last visit: Has Dressing in Place as Prescribed: Yes Pain Present Now: No Electronic Signature(s) Signed: 08/20/2022 7:50:28 AM By: Brenton Grills Entered By: Brenton Grills on 08/19/2022 08:26:08 -------------------------------------------------------------------------------- Encounter Discharge Information Details Patient Name: Date of Service: Marissa Berthold R. 08/19/2022 7:45 A M Medical Record Number: [id number] Patient Account Number: [id number] Date of Birth/Sex: Treating RN: [birth year] (72 y.o. [name] Primary Care Jian Hodgman: Jayme Cloud., [name] Other Clinician: Referring Tehran Rabenold: Treating Dionna Wiedemann/Extender: Allena Katz., [name] in Treatment: 32 Encounter Discharge Information Items Discharge  Condition: Stable Ambulatory Status: Ambulatory Discharge Destination: Skilled Nursing Facility Telephoned: No Orders Sent: Yes Transportation: Private Auto Accompanied By: self Schedule Follow-up Appointment: Yes Clinical Summary of Care: Patient Declined Electronic Signature(s) Signed: 08/20/2022 7:50:28 AM By: Brenton Grills Entered By: Brenton Grills on 08/19/2022 08:27:57 -------------------------------------------------------------------------------- Patient/Caregiver Education Details Patient Name: Date of Service: [name] 6/10/2024andnbsp7:45 A M Medical Record Number: [id number] Patient Account Number: [id number] Date of Birth/Gender: Treating RN: 16-Feb-1951 (72 y.o. [name] Primary Care Physician: Jayme Cloud., [name] Other Clinician: Referring Physician: Treating Physician/Extender: Allena Katz., [name] in Treatment: [address], [place] R ([id number]) [id number] Page 2 of 3 Education Assessment Education Provided To: Patient Education Topics Provided Wound/Skin Impairment: Methods: Explain/Verbal Responses: State content correctly Electronic Signature(s) Signed: 08/20/2022 7:50:28 AM By: Brenton Grills Entered By: Brenton Grills on 08/19/2022 08:27:32 -------------------------------------------------------------------------------- Wound Assessment Details Patient Name: Date of Service: CO Marissa Goody R. 08/19/2022 7:45 A M Medical Record Number: [id number] Patient Account Number: [id number] Date of Birth/Sex: Treating RN: 02/27/1951 (72 y.o. [name] Primary Care Evola Hollis: Jayme Cloud., [name] Other Clinician: Referring Brookelynn Hamor: Treating Veronda Gabor/Extender: Allena Katz., [name] in Treatment: 32 Wound Status Wound Number: 1 Primary Etiology: Pressure Ulcer Wound Location: Sacrum Wound Status: Open Wounding Event: Pressure Injury Comorbid History: Colitis Date  Acquired: 10/09/2021 Weeks Of Treatment: 32 Clustered Wound: No Wound Measurements Length: (cm) 0.2 Width: (cm) 0.4 Depth: (cm) 1 Area: (cm) 0.063 Volume: (cm) 0.063 % Reduction in Area: 82.2% % Reduction in Volume: 74.5% Tunneling: No Undermining: No Wound Description Classification: Category/Stage IV Exudate Amount: Medium Exudate Type: Serosanguineous Exudate Color: red, brown Periwound Skin Texture Texture Color No Abnormalities Noted: No No Abnormalities Noted: No Moisture No Abnormalities Noted: No Treatment Notes Wound #1 (Sacrum) Cleanser Normal Saline Discharge Instruction: Cleanse the wound with Normal Saline prior to applying a clean dressing using gauze sponges, not tissue or cotton balls. Vashe 5.8 (oz) Discharge Instruction: Cleanse the wound  with Vashe prior to applying a clean dressing using gauze sponges, not tissue or cotton balls. Peri-Wound Care Skin Prep Discharge Instruction: Use skin prep as directed Topical ALYNA, MCNANEY R ([id number]) [id number] Page 3 of 3 Primary Dressing Endoform 2x2 in Discharge Instruction: Moisten with saline Secondary Dressing Woven Gauze Sponge, Non-Sterile 4x4 in Discharge Instruction: Apply over primary dressing as directed. Zetuvit Plus Silicone Border Dressing 7x7(in/in) Discharge Instruction: Apply silicone border over primary dressing as directed. Secured With Compression Wrap Compression Stockings Add-Ons Electronic Signature(s) Signed: 08/20/2022 7:50:28 AM By: Brenton Grills Entered By: Brenton Grills on 08/19/2022 08:27:05 -------------------------------------------------------------------------------- Vitals Details Patient Name: Date of Service: Marissa Berthold R. 08/19/2022 7:45 A M Medical Record Number: [id number] Patient Account Number: [id number] Date of Birth/Sex: Treating RN: 1950/08/10 (72 y.o. [name] Primary Care [name]: Jayme Cloud., [name] Other  Clinician: Referring Melitza Metheny: Treating Slevin Gunby/Extender: Allena Katz., [name] in Treatment: 32 Vital Signs Time Taken: 08:26 Temperature (F): 98 Height (in): 64 Pulse (bpm): 56 Weight (lbs): 97 Respiratory Rate (breaths/min): 20 Body Mass Index (BMI): 16.6 Blood Pressure (mmHg): 133/75 Reference Range: 80 - 120 mg / dl Electronic Signature(s) Signed: 08/20/2022 7:50:28 AM By: Brenton Grills Entered By: Brenton Grills on 08/19/2022 08:26:43

## 2022-08-20 NOTE — Progress Notes (Signed)
[name], [name] ([id number]) [id number] Page 1 of 1 Visit Report for 08/19/2022 SuperBill Details Patient Name: Date of Service: [name] 08/19/2022 Medical Record Number: [id number] Patient Account Number: [id number] Date of Birth/Sex: Treating RN: 13-Jul-1950 (72 y.o. [name] Primary Care Provider: Jayme Cloud., [name] Other Clinician: Referring Provider: Treating Provider/Extender: Allena Katz., [name] in Treatment: 32 Diagnosis Coding ICD-10 Codes Code Description [phone] Pressure ulcer of sacral region, stage 4 M86.48 Chronic osteomyelitis with draining sinus, other site M62.81 Muscle weakness (generalized) E43 Unspecified severe protein-calorie malnutrition Facility Procedures CPT4 Code Description Modifier Quantity [id number] [phone] - WOUND CARE VISIT-LEV 3 EST PT 1 Electronic Signature(s) Signed: 08/19/2022 4:48:17 PM By: Geralyn Corwin DO Signed: 08/20/2022 7:50:28 AM By: Brenton Grills Entered By: Brenton Grills on 08/19/2022 29:52:84

## 2022-08-21 ENCOUNTER — Encounter (HOSPITAL_BASED_OUTPATIENT_CLINIC_OR_DEPARTMENT_OTHER): Payer: 59 | Admitting: Physician Assistant

## 2022-08-21 DIAGNOSIS — M8648 Chronic osteomyelitis with draining sinus, other site: Secondary | ICD-10-CM | POA: Diagnosis not present

## 2022-08-21 DIAGNOSIS — Z681 Body mass index (BMI) 19 or less, adult: Secondary | ICD-10-CM | POA: Diagnosis not present

## 2022-08-21 DIAGNOSIS — E43 Unspecified severe protein-calorie malnutrition: Secondary | ICD-10-CM | POA: Diagnosis not present

## 2022-08-21 DIAGNOSIS — L89154 Pressure ulcer of sacral region, stage 4: Secondary | ICD-10-CM | POA: Diagnosis not present

## 2022-08-21 DIAGNOSIS — M6281 Muscle weakness (generalized): Secondary | ICD-10-CM | POA: Diagnosis not present

## 2022-08-21 DIAGNOSIS — L89153 Pressure ulcer of sacral region, stage 3: Secondary | ICD-10-CM | POA: Diagnosis not present

## 2022-08-21 NOTE — Progress Notes (Addendum)
[name], [name] ([id number]) [id number] Page 1 of 8 Visit Report for 08/21/2022 Chief Complaint Document Details Patient Name: Date of Service: [name]. 08/21/2022 11:00 A M Medical Record Number: [id number] Patient Account Number: [id number] Date of Birth/Sex: Treating RN: 06/29/1950 (72 y.o. F) Primary Care Provider: Jayme Cloud., [name] Other Clinician: Referring Provider: Treating Provider/Extender: Crista Curb in Treatment: 33 Information Obtained from: Patient Chief Complaint Pressure ulcer stage 3 Sacrum Electronic Signature(s) Signed: 08/21/2022 11:29:42 AM By: Allen Derry PA-C Entered By: Allen Derry on 08/21/2022 11:29:42 -------------------------------------------------------------------------------- HPI Details Patient Name: Date of Service: [name], Marissa Sailors Cardenas. 08/21/2022 11:00 A M Medical Record Number: [id number] Patient Account Number: [id number] Date of Birth/Sex: Treating RN: [birth year] (72 y.o. F) Primary Care Provider: Jayme Cloud., [name] Other Clinician: Referring Provider: Treating Provider/Extender: Crista Curb in Treatment: 33 History of Present Illness HPI Description: 01-02-22 upon evaluation today patient presents for initial inspection here in our clinic concerning a wound that she has over the sacral region. This is stated to be present since around the beginning of August 2023. She currently resides in a assisted nursing facility. She does seem to be able to answer questions fully today. Subsequently I do note that she is a little on the thin side but again other than the protein calorie malnutrition she is minimally weak but still does get up and move around some but she also tells me that she "sits a lot". She has not had any x-rays of the sacral region at this point. 01-09-2022 upon evaluation today patient's wound in the sacral area actually appears to be doing  decently well. Fortunately I do not see any signs of infection which is great news and overall I am extremely pleased with where things stand today. 11/8; this is a patient who lives in some form of small assisted living in [place]. She has a deep wound over the lower part of her coccyx. An x-ray that we ordered from last time did not show any osseous abnormalities. We are supposed to be using Hydrofera Blue in the wound but I am really not sure what they are using to dress this and who is doing it. Talking to our staff they have apparently discussed this with the staff in the facility. She does not have home health. 01-23-2022 upon evaluation today patient appears to be doing decently well in regard to her wound. She has been tolerating the dressing changes without complication although I am not certain the dressing changes have been done appropriately over the past couple of weeks. We have been trying to get her to come in here we also try to get her into a wound care center in [place] which would be closer for the assisted living facility. Unfortunately neither 1 of those were undertaken up to this point. The patient did end up having some training of the staff from an RN that they brought him to have the staff perform the dressing changes but that being said it does not sound like this has been done correctly. Nonetheless I do believe that based on what we see it would benefit the patient to actually have her come here 3 times a week also think a wound VAC could potentially be a possibility for her which would help to get things moving in a much better direction as far as healing is concerned. We need to get this to fill-in though it looks clean and  does not look infected I do think that we need to really get this to fill-in and there is quite a bit of undermining that is to be considered here. 01-30-2022 upon evaluation today patient appears to be doing well currently in regard to her wound which  is looking pretty decent but still has quite a bit of space underneath as far as undermining is concerned. I think she might possibly be better with a wound VAC. With that being said if when I do this we probably only be able to do it 2 times a week at most. I discussed that with the patient today she is okay with that we just need to see if we can get the insurance approval and then of course the scheduling underway. 02-13-2022 upon evaluation today patient appears to be doing well currently in regard to her wound. She actually tells me that it is feeling a lot better which is good news. Fortunately I do not see any signs of active infection at this time. No fevers, chills, nausea, vomiting, or diarrhea. 02-27-2022 upon evaluation today patient appears to be doing well currently in regard to her wound. She is actually showing some signs of improvement we are LATREVA, BARTNIK Cardenas ([id number]) [id number] Page 2 of 8 still obtaining the approval for the snap VAC which I think could be beneficial in the meantime we been using the Hydrofera Blue rope. 03-27-2022 upon evaluation today patient appears to be doing okay in regard to her wound but there was no dressing in place upon arrival today. Fortunately I see again no evidence of infection. No fevers, chills, nausea, vomiting, or diarrhea. 04-03-2022 upon evaluation today patient did not have a dressing on when she came into the office. With that being said she actually tells me that it was on until yesterday she took a shower and it came loose and then today came off completely. With that being said I really do not think she needs to be taking shower every single day or having to see her here in the clinic 3 times per week on Monday, Wednesday, and Friday and during those times obviously in between she needs to probably avoid showering. For that reason what I advised her to do would be to shower in the mornings on the days that she  comes to see [place] that way if the dressing does come off or start to come off we will be changing it anyway. She voiced understanding and tells me she can do that. Unfortunately the facility has nobody to help her with changing the dressings and we are not currently having to do that here in the clinic again. This means that during the time that she was not coming in from December 20 through when I saw her last week on the 17th there was a period of 2 weeks where the wound really was not changed at all as the RN that Baird Lyons tell me they had at the facility did not end up working out. This obviously is not good and is not what we expect to see as far as patient care is concerned which is why we are now seeing her 3 times a week here in the clinic. 04-10-2022 upon evaluation today patient appears to be doing well currently in regard to her wound which is actually showing signs of good granulation epithelization at this point. However she does have quite a bit of drainage and we are little concerned about the possibility of infection. For  that reason I think she could benefit from a PCR culture followed by [facility] topical antibiotics. I am also thinking of going ahead and doing gentamicin today as well. 2/7; small wound on the lower sacrum however with considerable degree of undermining from roughly 7-4. PCR culture that was done last week showed "no organisms". We have been using gentamicin and iodoform packing.She lives in some form of group home in [place] I believe. Uncertain about the adequacy of offloading this area 04-24-2022 upon evaluation patient's wound actually is showing signs of doing decently well I do believe she would benefit from a snap VAC and we discussed that again today. Fortunately I do not see any signs of active infection locally nor systemically which is great news. No fevers, chills, nausea, vomiting, or diarrhea. 05-01-2022 upon evaluation today patient's wound is really doing  about the same. Fortunately I do not see any signs of active infection locally nor systemically at this time which is great news with that being said we have gotten approval for the snap VAC and we will going to go ahead and place that today. 05-08-2022 upon evaluation today patient appears to be doing well currently in regard to her wound all things considered but were having a difficult time with a snap VAC suctioning appropriately. With that being said we will get a go ahead and likely avoid the snap VAC at this point at least until we can get the actual brace dressing as bridging ourselves does not seem to be working nearly as well. 3/6; I note the difficulty with a snap VAC which is disappointing but apparently another type of VAC is being considered. Also a referral to plastic surgery at [facility]. The patient has a very small but deep and at the base circumferential undermining. She is coming in here now 3 times a week for [facility] placement 3/13; patient presents for follow-up. She has been using Hydrofera Blue 3 times a week. She comes into our clinic for nurse visits to have this done. We are still awaiting a wound VAC. She has no issues or complaints today. 05-29-2022 upon evaluation today patient appears to be doing about the same in regard to her wound. She still has significant undermining although the opening is a huge the undermining is significant. I do believe that a gauze VAC would be beneficial here. The patient does not eat normally and in fact tells me that she eats very well. She also is doing supplements with Juven at this point. She also has a good ability to get up and move around she is completely ambulatory and does not just lay with pressure on this area. She also is able to roll and reposition in bed without complication. For that reason she really has no need for a group 2 mattress at this point whatsoever this would be a complete waste of resources as far as insurance  and medical equipment is concerned. As far as her urinary incontinence she has no issues with incontinence at this point she is able to carry herself to the bathroom when needed she does wear a depends just in case but again that is not a routinely utilized item. 06-05-2022 upon evaluation today patient appears to be doing decently well in regard to her wound I do not see any signs of infection overall she seems to be doing quite well. I do think that she is making good progress in general here. We do have the wound VAC however and I think  this is going to potentially help to get this. Hand has been the biggest issue we have had is getting this to actually feeling more effectively. 06-12-2022 upon evaluation today patient appears to be doing okay in regard to her wound. We are still seeing some signs of improvement. Fortunately there does not appear to be any evidence of active infection at this time which is good news she did see the doctors over at [facility] yesterday and they are ordering an MRI in preparation for considering possibility of a surgical closure. The patient voiced understanding she had forgotten we have made this referral. T be o honest I was back in February kind of forgot about the referral to in the interim trying to proceed with her wound care. Nonetheless I appreciate their be evaluation of this and again we will see what the MRI shows. 06-19-2022 upon evaluation today patient's wound is actually showing signs of good improvement and very pleased with where we stand today. Fortunately I do not see any evidence of infection locally nor systemically which is great news I think the wound VAC is doing a good job of undermining seems less than last week. 06-26-2022 upon evaluation today patient appears to be doing well with regard to her wound although her wound VAC it actually died she had forgotten to charge it overnight. With that being said she does seem to be doing decently well at  this point based on what I am seeing I think that the undermining is much less than what it was previous. Fortunately I do not see any signs of active infection locally nor systemically at this time. 07-03-2022 upon evaluation today patient appears to be doing decently well in regard to her wound. She actually did have an MRI performed and I did review that today as well. It shows that she actually does have a focal marrow signal abnormality in the left aspect of the sacrum at the S4 level with adjacent soft tissue swelling and presumed sacral decubitus ulcer concerning for a small area of osteomyelitis. Otherwise there did not appear to be any fluid collection which is good news there is no signs of an obvious abscess. 07-10-2022 upon evaluation today patient appears to be doing well currently in regard to her wound. She has been tolerating the dressing changes without complication. Fortunately there does not appear to be any signs of active infection which is good news. 07-17-2022 upon evaluation today patient unfortunately does not seem to be making progress with the gauze VAC. We have given her a good trial with this and unfortunately I do not think that we are seeing any improvement for that reason I think it would be best to send this back and not continue therapy with the gauze back at this point. The patient voiced understanding and she is in agreement with that plan. She does have an appointment with the plastic surgeon at [facility] it was supposed to be yesterday but apparently got moved next Tuesday, May 14. 07-31-2022 upon evaluation today patient appears to be doing well currently in regard to her wound all things considered. She does have osteomyelitis of the sacral area which is noted. With that being said I think that the [facility] Blue rope has done decently well but I do believe that she may benefit from switching to endoform and just packed this very well with endoform. As it starts to  dissolve it would not be able to be over packed. She is in agreement with giving  this a trial. 08-07-2022 upon evaluation today patient appears to be doing really about the same in regard to her wound. The endoform I think may be doing a little bit better than the [facility] but were still not seeing a lot of progress here. This is unfortunate as again we are trying to get this healed as quickly as possible. Nonetheless I do believe that she would benefit from a likely antibiotic course as well as potentially hyperbaric oxygen therapy. With that being said I discussed with the patient at this point that we need to likely see about getting the labs done as it does not look like they were ever done back in April at the end when I ordered this. They were supposed to be done but we never received any results. Nonetheless at this point I think we would need updated results anyway in order to determine where we would need to go currently. She voiced understanding as did the individual that was with her today. [name], [name] ([id number]) [id number] Page 3 of 8 08-14-2022 upon evaluation today patient appears to be doing well currently in regard to her wound. This is any better but is also not any worse. We have been doing a good job keeping it under control. With that being said she did see Dr. Roxan Hockey at [facility] and they are actually proceed with a flap surgery. Again this I think is probably to be the primary best way to get this closed and she is in agreement with that plan she is actually very happy about the plan. 08-21-2022 upon evaluation patient appears to be doing about the same as compared to last week's evaluation. There is really no significant improvement overall in size or depth. She is still awaiting the appointment for the flap surgery. I did talk to the facility today and they are going to reach out to [facility] to try to figure out what the plan is as far as  getting her scheduled. Electronic Signature(s) Signed: 08/21/2022 2:24:59 PM By: Allen Derry PA-C Entered By: Allen Derry on 08/21/2022 14:24:59 -------------------------------------------------------------------------------- Physical Exam Details Patient Name: Date of Service: Marissa Marissa Goody Cardenas. 08/21/2022 11:00 A M Medical Record Number: [id number] Patient Account Number: [id number] Date of Birth/Sex: Treating RN: 1950-09-15 (72 y.o. F) Primary Care Provider: Jayme Cloud., [name] Other Clinician: Referring Provider: Treating Provider/Extender: Crista Curb in Treatment: 33 Constitutional Well-nourished and well-hydrated in no acute distress. Respiratory normal breathing without difficulty. Psychiatric this patient is able to make decisions and demonstrates good insight into disease process. Alert and Oriented x 3. pleasant and cooperative. Notes Upon inspection patient's wound bed actually showed signs of good granulation epithelization at this point. Fortunately I do not see any evidence of active infection locally nor systemically which is great news and overall I am extremely pleased with where things stand currently. Electronic Signature(s) Signed: 08/21/2022 2:28:14 PM By: Allen Derry PA-C Entered By: Allen Derry on 08/21/2022 14:28:13 -------------------------------------------------------------------------------- Physician Orders Details Patient Name: Date of Service: Marissa Marissa Goody Cardenas. 08/21/2022 11:00 A M Medical Record Number: [id number] Patient Account Number: [id number] Date of Birth/Sex: Treating RN: 25-Aug-1950 (72 y.o. [name], [name] Primary Care Provider: Jayme Cloud., [name] Other Clinician: Referring Provider: Treating Provider/Extender: Crista Curb in Treatment: 33 Verbal / Phone Orders: No Diagnosis Coding ICD-10 Coding Code Description L89.154 Pressure ulcer of sacral region, stage 4 M86.48 Chronic  osteomyelitis with draining sinus, other site M62.81  Muscle weakness (generalized) E43 Unspecified severe protein-calorie malnutrition [name], [name] ([id number]) [id number].pdf Page 4 of 8 Follow-up Appointments ppointment in 1 week. Allen Derry III PA-C Room 8 1100 08/28/2022 Return A Nurse Visit: - Twice a week Mondays 0745 and Fridays 1130 Other: - will see you three times a week with dressing changes till you have your surgery with Dr. Roxan Hockey then he will take over wound care. Can cancel wound care appts when surgery is planned. Anesthetic (In clinic) Topical Lidocaine 4% applied to wound bed Negative Presssure Wound Therapy Discontinue wound vac. Call the number on the machine for the company to pick up. Off-Loading Turn and reposition every 2 hours - when sitting. walk hourly to aid in offloading pressure to buttock. Other: - PCP at [facility] talked with pateint regarding possible Hyperbaric Oxygen Therapy. (08/07/22) Wound Treatment Wound #1 - Sacrum Cleanser: Normal Saline 3 x Per Week/30 Days Discharge Instructions: Cleanse the wound with Normal Saline prior to applying a clean dressing using gauze sponges, not tissue or cotton balls. Cleanser: Vashe 5.8 (oz) 3 x Per Week/30 Days Discharge Instructions: Cleanse the wound with Vashe prior to applying a clean dressing using gauze sponges, not tissue or cotton balls. Peri-Wound Care: Skin Prep 3 x Per Week/30 Days Discharge Instructions: Use skin prep as directed Prim Dressing: Endoform 2x2 in 3 x Per Week/30 Days ary Discharge Instructions: Moisten with saline Secondary Dressing: Woven Gauze Sponge, Non-Sterile 4x4 in 3 x Per Week/30 Days Discharge Instructions: Apply over primary dressing as directed. Secondary Dressing: Zetuvit Plus Silicone Border Dressing 7x7(in/in) 3 x Per Week/30 Days Discharge Instructions: Apply silicone border over primary dressing as directed. Electronic  Signature(s) Signed: 08/21/2022 4:47:25 PM By: Allen Derry PA-C Signed: 08/22/2022 5:31:32 PM By: Shawn Stall RN, BSN Entered By: Shawn Stall on 08/21/2022 12:00:58 -------------------------------------------------------------------------------- Problem List Details Patient Name: Date of Service: Marissa Marissa Goody Cardenas. 08/21/2022 11:00 A M Medical Record Number: [id number] Patient Account Number: [id number] Date of Birth/Sex: Treating RN: Jul 18, 1950 (72 y.o. F) Primary Care Provider: Jayme Cloud., [name] Other Clinician: Referring Provider: Treating Provider/Extender: Crista Curb in Treatment: 33 Active Problems ICD-10 Encounter Code Description Active Date MDM Diagnosis L89.154 Pressure ulcer of sacral region, stage 4 01/02/2022 No Yes M86.48 Chronic osteomyelitis with draining sinus, other site 07/31/2022 No Yes M62.81 Muscle weakness (generalized) 01/02/2022 No Yes [name], [name] ([id number]) [phone].pdf Page 5 of 8 E43 Unspecified severe protein-calorie malnutrition 01/02/2022 No Yes Inactive Problems Resolved Problems Electronic Signature(s) Signed: 08/21/2022 11:29:35 AM By: Allen Derry PA-C Entered By: Allen Derry on 08/21/2022 11:29:35 -------------------------------------------------------------------------------- Progress Note Details Patient Name: Date of Service: Marissa Marissa Goody Cardenas. 08/21/2022 11:00 A M Medical Record Number: [id number] Patient Account Number: [id number] Date of Birth/Sex: Treating RN: 1950/12/10 (72 y.o. F) Primary Care Provider: Jayme Cloud., [name] Other Clinician: Referring Provider: Treating Provider/Extender: Crista Curb in Treatment: 33 Subjective Chief Complaint Information obtained from Patient Pressure ulcer stage 3 Sacrum History of Present Illness (HPI) 01-02-22 upon evaluation today patient presents for initial inspection here in our clinic concerning  a wound that she has over the sacral region. This is stated to be present since around the beginning of August 2023. She currently resides in a assisted nursing facility. She does seem to be able to answer questions fully today. Subsequently I do note that she is a little on the thin side but again other than the protein calorie malnutrition she is  minimally weak but still does get up and move around some but she also tells me that she "sits a lot". She has not had any x-rays of the sacral region at this point. 01-09-2022 upon evaluation today patient's wound in the sacral area actually appears to be doing decently well. Fortunately I do not see any signs of infection which is great news and overall I am extremely pleased with where things stand today. 11/8; this is a patient who lives in some form of small assisted living in [place]. She has a deep wound over the lower part of her coccyx. An x-ray that we ordered from last time did not show any osseous abnormalities. We are supposed to be using Hydrofera Blue in the wound but I am really not sure what they are using to dress this and who is doing it. Talking to our staff they have apparently discussed this with the staff in the facility. She does not have home health. 01-23-2022 upon evaluation today patient appears to be doing decently well in regard to her wound. She has been tolerating the dressing changes without complication although I am not certain the dressing changes have been done appropriately over the past couple of weeks. We have been trying to get her to come in here we also try to get her into a wound care center in [place] which would be closer for the assisted living facility. Unfortunately neither 1 of those were undertaken up to this point. The patient did end up having some training of the staff from an RN that they brought him to have the staff perform the dressing changes but that being said it does not sound like this has been  done correctly. Nonetheless I do believe that based on what we see it would benefit the patient to actually have her come here 3 times a week also think a wound VAC could potentially be a possibility for her which would help to get things moving in a much better direction as far as healing is concerned. We need to get this to fill-in though it looks clean and does not look infected I do think that we need to really get this to fill-in and there is quite a bit of undermining that is to be considered here. 01-30-2022 upon evaluation today patient appears to be doing well currently in regard to her wound which is looking pretty decent but still has quite a bit of space underneath as far as undermining is concerned. I think she might possibly be better with a wound VAC. With that being said if when I do this we probably only be able to do it 2 times a week at most. I discussed that with the patient today she is okay with that we just need to see if we can get the insurance approval and then of course the scheduling underway. 02-13-2022 upon evaluation today patient appears to be doing well currently in regard to her wound. She actually tells me that it is feeling a lot better which is good news. Fortunately I do not see any signs of active infection at this time. No fevers, chills, nausea, vomiting, or diarrhea. 02-27-2022 upon evaluation today patient appears to be doing well currently in regard to her wound. She is actually showing some signs of improvement we are still obtaining the approval for the snap VAC which I think could be beneficial in the meantime we been using the Hydrofera Blue rope. 03-27-2022 upon evaluation today patient appears  to be doing okay in regard to her wound but there was no dressing in place upon arrival today. Fortunately I see again no evidence of infection. No fevers, chills, nausea, vomiting, or diarrhea. 04-03-2022 upon evaluation today patient did not have a dressing on when  she came into the office. With that being said she actually tells me that it was on until yesterday she took a shower and it came loose and then today came off completely. With that being said I really do not think she needs to be taking shower every single day or having to see her here in the clinic 3 times per week on Monday, Wednesday, and Friday and during those times obviously in between she needs to probably avoid showering. For that reason what I advised her to do would be to shower in the mornings on the days that she comes to see [place] that way if the dressing does come off or start to come off we will be changing it anyway. She voiced understanding and tells me she can do that. Unfortunately the facility has nobody to help her with changing the dressings and we are not currently having to do that here in the clinic again. This means that during the time that she was not coming in from December 20 through when I saw her last week on the 17th there was a period of 2 weeks where the wound really was not changed at all as the RN that Baird Lyons tell me they had at the facility did not end up working out. This obviously is not good and is not what [name], [name] ([id number]) [id number] Page 6 of 8 we expect to see as far as patient care is concerned which is why we are now seeing her 3 times a week here in the clinic. 04-10-2022 upon evaluation today patient appears to be doing well currently in regard to her wound which is actually showing signs of good granulation epithelization at this point. However she does have quite a bit of drainage and we are little concerned about the possibility of infection. For that reason I think she could benefit from a PCR culture followed by [facility] topical antibiotics. I am also thinking of going ahead and doing gentamicin today as well. 2/7; small wound on the lower sacrum however with considerable degree of undermining from roughly 7-4.  PCR culture that was done last week showed "no organisms". We have been using gentamicin and iodoform packing.She lives in some form of group home in [place] I believe. Uncertain about the adequacy of offloading this area 04-24-2022 upon evaluation patient's wound actually is showing signs of doing decently well I do believe she would benefit from a snap VAC and we discussed that again today. Fortunately I do not see any signs of active infection locally nor systemically which is great news. No fevers, chills, nausea, vomiting, or diarrhea. 05-01-2022 upon evaluation today patient's wound is really doing about the same. Fortunately I do not see any signs of active infection locally nor systemically at this time which is great news with that being said we have gotten approval for the snap VAC and we will going to go ahead and place that today. 05-08-2022 upon evaluation today patient appears to be doing well currently in regard to her wound all things considered but were having a difficult time with a snap VAC suctioning appropriately. With that being said we will get a go ahead and likely avoid the snap VAC at  this point at least until we can get the actual brace dressing as bridging ourselves does not seem to be working nearly as well. 3/6; I note the difficulty with a snap VAC which is disappointing but apparently another type of VAC is being considered. Also a referral to plastic surgery at [facility] The patient has a very small but deep and at the base circumferential undermining. She is coming in here now 3 times a week for [facility] placement 3/13; patient presents for follow-up. She has been using Hydrofera Blue 3 times a week. She comes into our clinic for nurse visits to have this done. We are still awaiting a wound VAC. She has no issues or complaints today. 05-29-2022 upon evaluation today patient appears to be doing about the same in regard to her wound. She still has significant  undermining although the opening is a huge the undermining is significant. I do believe that a gauze VAC would be beneficial here. The patient does not eat normally and in fact tells me that she eats very well. She also is doing supplements with Juven at this point. She also has a good ability to get up and move around she is completely ambulatory and does not just lay with pressure on this area. She also is able to roll and reposition in bed without complication. For that reason she really has no need for a group 2 mattress at this point whatsoever this would be a complete waste of resources as far as insurance and medical equipment is concerned. As far as her urinary incontinence she has no issues with incontinence at this point she is able to carry herself to the bathroom when needed she does wear a depends just in case but again that is not a routinely utilized item. 06-05-2022 upon evaluation today patient appears to be doing decently well in regard to her wound I do not see any signs of infection overall she seems to be doing quite well. I do think that she is making good progress in general here. We do have the wound VAC however and I think this is going to potentially help to get this. Hand has been the biggest issue we have had is getting this to actually feeling more effectively. 06-12-2022 upon evaluation today patient appears to be doing okay in regard to her wound. We are still seeing some signs of improvement. Fortunately there does not appear to be any evidence of active infection at this time which is good news she did see the doctors over at [facility] yesterday and they are ordering an MRI in preparation for considering possibility of a surgical closure. The patient voiced understanding she had forgotten we have made this referral. T be o honest I was back in February kind of forgot about the referral to in the interim trying to proceed with her wound care. Nonetheless I appreciate their  be evaluation of this and again we will see what the MRI shows. 06-19-2022 upon evaluation today patient's wound is actually showing signs of good improvement and very pleased with where we stand today. Fortunately I do not see any evidence of infection locally nor systemically which is great news I think the wound VAC is doing a good job of undermining seems less than last week. 06-26-2022 upon evaluation today patient appears to be doing well with regard to her wound although her wound VAC it actually died she had forgotten to charge it overnight. With that being said she does seem to  be doing decently well at this point based on what I am seeing I think that the undermining is much less than what it was previous. Fortunately I do not see any signs of active infection locally nor systemically at this time. 07-03-2022 upon evaluation today patient appears to be doing decently well in regard to her wound. She actually did have an MRI performed and I did review that today as well. It shows that she actually does have a focal marrow signal abnormality in the left aspect of the sacrum at the S4 level with adjacent soft tissue swelling and presumed sacral decubitus ulcer concerning for a small area of osteomyelitis. Otherwise there did not appear to be any fluid collection which is good news there is no signs of an obvious abscess. 07-10-2022 upon evaluation today patient appears to be doing well currently in regard to her wound. She has been tolerating the dressing changes without complication. Fortunately there does not appear to be any signs of active infection which is good news. 07-17-2022 upon evaluation today patient unfortunately does not seem to be making progress with the gauze VAC. We have given her a good trial with this and unfortunately I do not think that we are seeing any improvement for that reason I think it would be best to send this back and not continue therapy with the gauze back at this  point. The patient voiced understanding and she is in agreement with that plan. She does have an appointment with the plastic surgeon at [facility] it was supposed to be yesterday but apparently got moved next Tuesday, May 14. 07-31-2022 upon evaluation today patient appears to be doing well currently in regard to her wound all things considered. She does have osteomyelitis of the sacral area which is noted. With that being said I think that the [facility] Blue rope has done decently well but I do believe that she may benefit from switching to endoform and just packed this very well with endoform. As it starts to dissolve it would not be able to be over packed. She is in agreement with giving this a trial. 08-07-2022 upon evaluation today patient appears to be doing really about the same in regard to her wound. The endoform I think may be doing a little bit better than the [facility] but were still not seeing a lot of progress here. This is unfortunate as again we are trying to get this healed as quickly as possible. Nonetheless I do believe that she would benefit from a likely antibiotic course as well as potentially hyperbaric oxygen therapy. With that being said I discussed with the patient at this point that we need to likely see about getting the labs done as it does not look like they were ever done back in April at the end when I ordered this. They were supposed to be done but we never received any results. Nonetheless at this point I think we would need updated results anyway in order to determine where we would need to go currently. She voiced understanding as did the individual that was with her today. 08-14-2022 upon evaluation today patient appears to be doing well currently in regard to her wound. This is any better but is also not any worse. We have been doing a good job keeping it under control. With that being said she did see Dr. Roxan Hockey at [facility] and they are actually proceed with a  flap surgery. Again this I think is probably to be  the primary best way to get this closed and she is in agreement with that plan she is actually very happy about the plan. 08-21-2022 upon evaluation patient appears to be doing about the same as compared to last week's evaluation. There is really no significant improvement overall in size or depth. She is still awaiting the appointment for the flap surgery. I did talk to the facility today and they are going to reach out to [facility] to try to figure out what the plan is as far as getting her scheduled. [name], [name] ([id number]) [id number] Page 7 of 8 Objective Constitutional Well-nourished and well-hydrated in no acute distress. Vitals Time Taken: 11:28 AM, Height: 64 in, Weight: 97 lbs, BMI: 16.6, Temperature: 98.3 F, Pulse: 64 bpm, Respiratory Rate: 18 breaths/min, Blood Pressure: 132/83 mmHg. Respiratory normal breathing without difficulty. Psychiatric this patient is able to make decisions and demonstrates good insight into disease process. Alert and Oriented x 3. pleasant and cooperative. General Notes: Upon inspection patient's wound bed actually showed signs of good granulation epithelization at this point. Fortunately I do not see any evidence of active infection locally nor systemically which is great news and overall I am extremely pleased with where things stand currently. Integumentary (Hair, Skin) Wound #1 status is Open. Original cause of wound was Pressure Injury. The date acquired was: 10/09/2021. The wound has been in treatment 33 weeks. The wound is located on the Sacrum. The wound measures 0.2cm length x 0.2cm width x 0.6cm depth; 0.031cm^2 area and 0.019cm^3 volume. There is undermining starting at 9:00 and ending at 12:00 with a maximum distance of 2cm. There is a medium amount of serosanguineous drainage noted. The wound margin is epibole. There is large (67-100%) red granulation within the  wound bed. There is no necrotic tissue within the wound bed. The periwound skin appearance did not exhibit: Callus, Crepitus, Excoriation, Induration, Rash, Scarring, Dry/Scaly, Maceration, Atrophie Blanche, Cyanosis, Ecchymosis, Hemosiderin Staining, Mottled, Pallor, Rubor, Erythema. 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 Allen Derry III PA-C Room 8 1100 08/28/2022 Nurse Visit: - Twice a week Mondays 0745 and Fridays 1130 Other: - will see you three times a week with dressing changes till you have your surgery with Dr. Roxan Hockey then he will take over wound care. Can cancel wound care appts when surgery is planned. Anesthetic: (In clinic) Topical Lidocaine 4% applied to wound bed Negative Presssure Wound Therapy: Discontinue wound vac. Call the number on the machine for the company to pick up. Off-Loading: Turn and reposition every 2 hours - when sitting. walk hourly to aid in offloading pressure to buttock. Other: - PCP at [facility] talked with pateint regarding possible Hyperbaric Oxygen Therapy. (08/07/22) WOUND #1: - Sacrum Wound Laterality: Cleanser: Normal Saline 3 x Per Week/30 Days Discharge Instructions: Cleanse the wound with Normal Saline prior to applying a clean dressing using gauze sponges, not tissue or cotton balls. Cleanser: Vashe 5.8 (oz) 3 x Per Week/30 Days Discharge Instructions: Cleanse the wound with Vashe prior to applying a clean dressing using gauze sponges, not tissue or cotton balls. Peri-Wound Care: Skin Prep 3 x Per Week/30 Days Discharge Instructions: Use skin prep as directed Prim Dressing: Endoform 2x2 in 3 x Per Week/30 Days ary Discharge Instructions: Moisten with saline Secondary Dressing: Woven Gauze Sponge, Non-Sterile  4x4 in 3 x Per Week/30  Days Discharge Instructions: Apply over primary dressing as directed. Secondary Dressing: Zetuvit Plus Silicone Border Dressing 7x7(in/in) 3 x Per Week/30 Days Discharge Instructions: Apply silicone border over primary dressing as directed. 1. Based on what I am seeing I do think that he should continue with the current measures at this point. I am very pleased with how things stand and where we are at this time. 2. With regard to her wound again she is looking towards a flap surgery. They have not scheduled this however. We did reach out to the facility and they are going to contact and try to see about getting this scheduled. We will see patient back for reevaluation in 1 week here in the clinic. If anything worsens or changes patient will contact our office for additional recommendations. [name], [name] ([id number]) [id number] Page 8 of 8 Electronic Signature(s) Signed: 08/21/2022 2:35:58 PM By: Allen Derry PA-C Entered By: Allen Derry on 08/21/2022 14:35:58 -------------------------------------------------------------------------------- SuperBill Details Patient Name: Date of Service: [name], Marissa Sailors Cardenas. 08/21/2022 Medical Record Number: [id number] Patient Account Number: [id number] Date of Birth/Sex: Treating RN: 14-Dec-1950 (72 y.o. [name], [name] Primary Care Provider: Jayme Cloud., [name] Other Clinician: Referring Provider: Treating Provider/Extender: Crista Curb in Treatment: 33 Diagnosis Coding ICD-10 Codes Code Description [phone] Pressure ulcer of sacral region, stage 4 M86.48 Chronic osteomyelitis with draining sinus, other site M62.81 Muscle weakness (generalized) E43 Unspecified severe protein-calorie malnutrition Facility Procedures : CPT4 Code: [id number] Description: 99213 - WOUND CARE VISIT-LEV 3 EST PT Modifier: Quantity: 1 Physician Procedures : CPT4 Code Description Modifier  [id number] 99213 - WC PHYS LEVEL 3 - EST PT ICD-10 Diagnosis Description L89.154 Pressure ulcer of sacral region, stage 4 M86.48 Chronic osteomyelitis with draining sinus, other site M62.81 Muscle weakness (generalized) E43  Unspecified severe protein-calorie malnutrition Quantity: 1 Electronic Signature(s) Signed: 08/21/2022 2:36:25 PM By: Allen Derry PA-C Entered By: Allen Derry on 08/21/2022 14:36:25

## 2022-08-23 ENCOUNTER — Encounter (HOSPITAL_BASED_OUTPATIENT_CLINIC_OR_DEPARTMENT_OTHER): Payer: 59 | Admitting: Internal Medicine

## 2022-08-23 DIAGNOSIS — M6281 Muscle weakness (generalized): Secondary | ICD-10-CM | POA: Diagnosis not present

## 2022-08-23 DIAGNOSIS — E43 Unspecified severe protein-calorie malnutrition: Secondary | ICD-10-CM | POA: Diagnosis not present

## 2022-08-23 DIAGNOSIS — Z681 Body mass index (BMI) 19 or less, adult: Secondary | ICD-10-CM | POA: Diagnosis not present

## 2022-08-23 DIAGNOSIS — L89154 Pressure ulcer of sacral region, stage 4: Secondary | ICD-10-CM | POA: Diagnosis not present

## 2022-08-23 DIAGNOSIS — M8648 Chronic osteomyelitis with draining sinus, other site: Secondary | ICD-10-CM | POA: Diagnosis not present

## 2022-08-23 DIAGNOSIS — L89153 Pressure ulcer of sacral region, stage 3: Secondary | ICD-10-CM | POA: Diagnosis not present

## 2022-08-24 NOTE — Progress Notes (Signed)
[name], [name] ([id number]) [id number] Page 1 of 7 Visit Report for 08/21/2022 Arrival Information Details Patient Name: Date of Service: Marissa Lou. 08/21/2022 11:00 A M Medical Record Number: [id number] Patient Account Number: [id number] Date of Birth/Sex: Treating RN: [birth year] (72 y.o. [name], [name] Primary Care Vanissa Strength: Jayme Cloud., [name] Other Clinician: Referring Lindamarie Maclachlan: Treating Theodis Kinsel/Extender: Crista Curb in Treatment: 33 Visit Information History Since Last Visit Added or deleted any medications: No Patient Arrived: Ambulatory Any new allergies or adverse reactions: No Arrival Time: 11:25 Had a fall or experienced change in No Accompanied By: self activities of daily living that may affect Transfer Assistance: None risk of falls: Patient Identification Verified: Yes Signs or symptoms of abuse/neglect since last visito No Secondary Verification Process Completed: Yes Hospitalized since last visit: No Patient Requires Transmission-Based Precautions: No Implantable device outside of the clinic excluding No Patient Has Alerts: No cellular tissue based products placed in the center since last visit: Has Dressing in Place as Prescribed: Yes Pain Present Now: No Notes per patient has not heard anything from [facility] when her surgery will be scheduled. Electronic Signature(s) Signed: 08/22/2022 5:31:32 PM By: Shawn Stall RN, BSN Entered By: Shawn Stall on 08/21/2022 11:28:03 -------------------------------------------------------------------------------- Clinic Level of Care Assessment Details Patient Name: Date of Service: Marissa Deliah Goody Cardenas. 08/21/2022 11:00 A M Medical Record Number: [id number] Patient Account Number: [id number] Date of Birth/Sex: Treating RN: Mar 18, 1950 (72 y.o. [name], [name] Primary Care Deshayla Empson: Jayme Cloud., [name] Other Clinician: Referring Bryer Gottsch: Treating  Chardonay Scritchfield/Extender: Crista Curb in Treatment: 33 Clinic Level of Care Assessment Items TOOL 4 Quantity Score X- 1 0 Use when only an EandM is performed on FOLLOW-UP visit ASSESSMENTS - Nursing Assessment / Reassessment X- 1 10 Reassessment of Marissa-morbidities (includes updates in patient status) X- 1 5 Reassessment of Adherence to Treatment Plan ASSESSMENTS - Wound and Skin A ssessment / Reassessment X - Simple Wound Assessment / Reassessment - one wound 1 5 []  - 0 Complex Wound Assessment / Reassessment - multiple wounds []  - 0 Dermatologic / Skin Assessment (not related to wound area) ASSESSMENTS - Focused Assessment []  - 0 Circumferential Edema Measurements - multi extremities [name], [name] ([id number]) [id number] Page 2 of 7 []  - 0 Nutritional Assessment / Counseling / Intervention []  - 0 Lower Extremity Assessment (monofilament, tuning fork, pulses) []  - 0 Peripheral Arterial Disease Assessment (using hand held doppler) ASSESSMENTS - Ostomy and/or Continence Assessment and Care []  - 0 Incontinence Assessment and Management []  - 0 Ostomy Care Assessment and Management (repouching, etc.) PROCESS - Coordination of Care X - Simple Patient / Family Education for ongoing care 1 15 []  - 0 Complex (extensive) Patient / Family Education for ongoing care X- 1 10 Staff obtains Chiropractor, Records, T Results / Process Orders est []  - 0 Staff telephones HHA, Nursing Homes / Clarify orders / etc []  - 0 Routine Transfer to another Facility (non-emergent condition) []  - 0 Routine Hospital Admission (non-emergent condition) []  - 0 New Admissions / Manufacturing engineer / Ordering NPWT Apligraf, etc. , []  - 0 Emergency Hospital Admission (emergent condition) X- 1 10 Simple Discharge Coordination []  - 0 Complex (extensive) Discharge Coordination PROCESS - Special Needs []  - 0 Pediatric / Minor Patient Management []  -  0 Isolation Patient Management []  - 0 Hearing / Language / Visual special needs []  - 0 Assessment of Community assistance (transportation, D/C planning, etc.) []  - 0 Additional  assistance / Altered mentation []  - 0 Support Surface(s) Assessment (bed, cushion, seat, etc.) INTERVENTIONS - Wound Cleansing / Measurement X - Simple Wound Cleansing - one wound 1 5 []  - 0 Complex Wound Cleansing - multiple wounds X- 1 5 Wound Imaging (photographs - any number of wounds) []  - 0 Wound Tracing (instead of photographs) X- 1 5 Simple Wound Measurement - one wound []  - 0 Complex Wound Measurement - multiple wounds INTERVENTIONS - Wound Dressings X - Small Wound Dressing one or multiple wounds 1 10 []  - 0 Medium Wound Dressing one or multiple wounds []  - 0 Large Wound Dressing one or multiple wounds []  - 0 Application of Medications - topical []  - 0 Application of Medications - injection INTERVENTIONS - Miscellaneous []  - 0 External ear exam []  - 0 Specimen Collection (cultures, biopsies, blood, body fluids, etc.) []  - 0 Specimen(s) / Culture(s) sent or taken to Lab for analysis []  - 0 Patient Transfer (multiple staff / Michiel Sites Lift / Similar devices) []  - 0 Simple Staple / Suture removal (25 or less) []  - 0 Complex Staple / Suture removal (26 or more) Lohn, Josefita Cardenas ([id number]) [phone].pdf Page 3 of 7 []  - 0 Hypo / Hyperglycemic Management (close monitor of Blood Glucose) []  - 0 Ankle / Brachial Index (ABI) - do not check if billed separately X- 1 5 Vital Signs Has the patient been seen at the hospital within the last three years: Yes Total Score: 85 Level Of Care: New/Established - Level 3 Electronic Signature(s) Signed: 08/22/2022 5:31:32 PM By: Shawn Stall RN, BSN Entered By: Shawn Stall on 08/21/2022 12:01:30 -------------------------------------------------------------------------------- Encounter Discharge Information Details Patient  Name: Date of Service: Marissa Berthold Cardenas. 08/21/2022 11:00 A M Medical Record Number: [id number] Patient Account Number: [id number] Date of Birth/Sex: Treating RN: November 28, 1950 (72 y.o. [name], [name] Primary Care Roth Ress: Jayme Cloud., [name] Other Clinician: Referring Chuckie Mccathern: Treating Sedale Jenifer/Extender: Crista Curb in Treatment: 33 Encounter Discharge Information Items Discharge Condition: Stable Ambulatory Status: Ambulatory Discharge Destination: Home Transportation: Private Auto Accompanied By: caregiver Schedule Follow-up Appointment: Yes Clinical Summary of Care: Electronic Signature(s) Signed: 08/22/2022 5:31:32 PM By: Shawn Stall RN, BSN Entered By: Shawn Stall on 08/21/2022 12:02:02 -------------------------------------------------------------------------------- Lower Extremity Assessment Details Patient Name: Date of Service: Marissa Deliah Goody Cardenas. 08/21/2022 11:00 A M Medical Record Number: [id number] Patient Account Number: [id number] Date of Birth/Sex: Treating RN: 1950/05/04 (72 y.o. [name], [name] Primary Care Brace Welte: Jayme Cloud., [name] Other Clinician: Referring Amaru Burroughs: Treating Tani Virgo/Extender: Crista Curb in Treatment: 33 Electronic Signature(s) Signed: 08/22/2022 5:31:32 PM By: Shawn Stall RN, BSN Entered By: Shawn Stall on 08/21/2022 11:30:19 [name] ([id number]) [id number] Page 4 of 7 -------------------------------------------------------------------------------- Multi-Disciplinary Care Plan Details Patient Name: Date of Service: Marissa Lou. 08/21/2022 11:00 A M Medical Record Number: [id number] Patient Account Number: [id number] Date of Birth/Sex: Treating RN: 11-05-1950 (72 y.o. [name], [name] Primary Care Boston Catarino: Jayme Cloud., [name] Other Clinician: Referring Dyesha Henault: Treating Kacy Hegna/Extender: Crista Curb in Treatment: 33 Active Inactive Osteomyelitis Nursing Diagnoses: Infection: osteomyelitis Knowledge deficit related to disease process and management Goals: Diagnostic evaluation for osteomyelitis completed as ordered Date Initiated: 07/03/2022 Target Resolution Date: 10/09/2022 Goal Status: Active Patient/caregiver will verbalize understanding of disease process and disease management Date Initiated: 07/03/2022 Target Resolution Date: 10/09/2022 Goal Status: Active Interventions: Assess for signs and symptoms of osteomyelitis resolution every visit Provide education on osteomyelitis  Screen for HBO Treatment Activities: Consult for HBO : 07/03/2022 MRI : 06/26/2022 Systemic antibiotics : 07/03/2022 White Blood Cell Scan : 07/03/2022 Notes: Pain, Acute or Chronic Nursing Diagnoses: Pain, acute or chronic: actual or potential Potential alteration in comfort, pain Goals: Patient will verbalize adequate pain control and receive pain control interventions during procedures as needed Date Initiated: 01/02/2022 Target Resolution Date: 10/09/2022 Goal Status: Active Patient/caregiver will verbalize adequate pain control between visits Date Initiated: 01/02/2022 Date Inactivated: 07/17/2022 Target Resolution Date: 07/13/2022 Goal Status: Met Interventions: Encourage patient to take pain medications as prescribed Provide education on pain management Reposition patient for comfort Treatment Activities: Administer pain control measures as ordered : 01/02/2022 Notes: Electronic Signature(s) Signed: 08/22/2022 5:31:32 PM By: Shawn Stall RN, BSN Entered By: Shawn Stall on 08/21/2022 11:58:10 [name] ([id number]) [id number] Page 5 of 7 -------------------------------------------------------------------------------- Pain Assessment Details Patient Name: Date of Service: Marissa Lou. 08/21/2022 11:00 A M Medical Record Number:  [id number] Patient Account Number: [id number] Date of Birth/Sex: Treating RN: Dec 04, 1950 (72 y.o. [name], [name] Primary Care Calahan Pak: Jayme Cloud., [name] Other Clinician: Referring Amariyah Bazar: Treating Bandon Sherwin/Extender: Crista Curb in Treatment: 33 Active Problems Location of Pain Severity and Description of Pain Patient Has Paino No Site Locations Pain Management and Medication Current Pain Management: Electronic Signature(s) Signed: 08/22/2022 5:31:32 PM By: Shawn Stall RN, BSN Entered By: Shawn Stall on 08/21/2022 11:30:13 -------------------------------------------------------------------------------- Patient/Caregiver Education Details Patient Name: Date of Service: [name] 6/12/2024andnbsp11:00 A M Medical Record Number: [id number] Patient Account Number: [id number] Date of Birth/Gender: Treating RN: 05-25-1950 (72 y.o. [name], [name] Primary Care Physician: Jayme Cloud., [name] Other Clinician: Referring Physician: Treating Physician/Extender: Crista Curb in Treatment: 33 Education Assessment Education Provided To: Patient and Caregiver Ty Education Topics Provided Wound/Skin Impairment: Handouts: Caring for Your Ulcer Methods: Explain/Verbal Responses: Reinforcements needed Electronic Signature(s) Signed: 08/22/2022 5:31:32 PM By: Shawn Stall RN, BSN [place], Karel Jarvis ([id number]) [id number] Page 6 of 7 Entered By: Shawn Stall on 08/21/2022 11:59:57 -------------------------------------------------------------------------------- Wound Assessment Details Patient Name: Date of Service: Marissa Lou. 08/21/2022 11:00 A M Medical Record Number: [id number] Patient Account Number: [id number] Date of Birth/Sex: Treating RN: September 18, 1950 (72 y.o. [name], [name] Primary Care Betzy Barbier: Jayme Cloud., [name] Other Clinician: Referring Christorpher Hisaw: Treating  Willim Turnage/Extender: Crista Curb in Treatment: 33 Wound Status Wound Number: 1 Primary Etiology: Pressure Ulcer Wound Location: Sacrum Wound Status: Open Wounding Event: Pressure Injury Comorbid History: Colitis Date Acquired: 10/09/2021 Weeks Of Treatment: 33 Clustered Wound: No Photos Wound Measurements Length: (cm) 0.2 Width: (cm) 0.2 Depth: (cm) 0.6 Area: (cm) 0.031 Volume: (cm) 0.019 % Reduction in Area: 91.2% % Reduction in Volume: 92.3% Undermining: Yes Starting Position (o'clock): 9 Ending Position (o'clock): 12 Maximum Distance: (cm) 2 Wound Description Classification: Category/Stage IV Wound Margin: Epibole Exudate Amount: Medium Exudate Type: Serosanguineous Exudate Color: red, brown Foul Odor After Cleansing: No Slough/Fibrino No Wound Bed Granulation Amount: Large (67-100%) Exposed Structure Granulation Quality: Red Fascia Exposed: No Necrotic Amount: None Present (0%) Fat Layer (Subcutaneous Tissue) Exposed: No Tendon Exposed: No Muscle Exposed: No Joint Exposed: No Bone Exposed: No Periwound Skin Texture Texture Color No Abnormalities Noted: No No Abnormalities Noted: No Callus: No Atrophie Blanche: No Crepitus: No Cyanosis: No Excoriation: No Ecchymosis: No Induration: No Erythema: No Rash: No Hemosiderin Staining: No [name], [name] ([id number]) [phone].pdf Page 7 of 7 Scarring: No Mottled: No Pallor:  No Moisture Rubor: No No Abnormalities Noted: No Dry / Scaly: No Maceration: No Treatment Notes Wound #1 (Sacrum) Cleanser Normal Saline Discharge Instruction: Cleanse the wound with Normal Saline prior to applying a clean dressing using gauze sponges, not tissue or cotton balls. Vashe 5.8 (oz) Discharge Instruction: Cleanse the wound with Vashe prior to applying a clean dressing using gauze sponges, not tissue or cotton balls. Peri-Wound Care Skin Prep Discharge Instruction:  Use skin prep as directed Topical Primary Dressing Endoform 2x2 in Discharge Instruction: Moisten with saline Secondary Dressing Woven Gauze Sponge, Non-Sterile 4x4 in Discharge Instruction: Apply over primary dressing as directed. Zetuvit Plus Silicone Border Dressing 7x7(in/in) Discharge Instruction: Apply silicone border over primary dressing as directed. Secured With Compression Wrap Compression Stockings Facilities manager) Signed: 08/22/2022 5:31:32 PM By: Shawn Stall RN, BSN Entered By: Shawn Stall on 08/21/2022 11:33:41 -------------------------------------------------------------------------------- Vitals Details Patient Name: Date of Service: Marissa Berthold Cardenas. 08/21/2022 11:00 A M Medical Record Number: [id number] Patient Account Number: [id number] Date of Birth/Sex: Treating RN: [birth year] (72 y.o. [name], [name] Primary Care Nayanna Seaborn: Jayme Cloud., [name] Other Clinician: Referring Perez Dirico: Treating Inell Mimbs/Extender: Crista Curb in Treatment: 33 Vital Signs Time Taken: 11:28 Temperature (F): 98.3 Height (in): 64 Pulse (bpm): 64 Weight (lbs): 97 Respiratory Rate (breaths/min): 18 Body Mass Index (BMI): 16.6 Blood Pressure (mmHg): 132/83 Reference Range: 80 - 120 mg / dl Electronic Signature(s) Signed: 08/22/2022 5:31:32 PM By: Shawn Stall RN, BSN Entered By: Shawn Stall on 08/21/2022 11:30:02

## 2022-08-25 NOTE — Progress Notes (Signed)
VESTIE, SULLINS Cardenas ([id number]) [id number] Page 1 of 5 Visit Report for 08/23/2022 Arrival Information Details Patient Name: Date of Service: [name]. 08/23/2022 11:15 A M Medical Record Number: [id number] Patient Account Number: [id number] Date of Birth/Sex: Treating RN: 1950-06-18 (71 y.o. [name], [name] Primary Care Jernard Reiber: Jayme Cloud., [name] Other Clinician: Referring Elihu Milstein: Treating Hymie Gorr/Extender: Crissie Figures in Treatment: 33 Visit Information History Since Last Visit Added or deleted any medications: No Patient Arrived: Ambulatory Any new allergies or adverse reactions: No Arrival Time: 11:54 Had a fall or experienced change in No Accompanied By: self activities of daily living that may affect Transfer Assistance: None risk of falls: Patient Identification Verified: Yes Signs or symptoms of abuse/neglect since last visito No Secondary Verification Process Completed: Yes Hospitalized since last visit: No Patient Requires Transmission-Based Precautions: No Implantable device outside of the clinic excluding No Patient Has Alerts: No cellular tissue based products placed in the center since last visit: Has Dressing in Place as Prescribed: Yes Pain Present Now: No Electronic Signature(s) Signed: 08/23/2022 5:08:12 PM By: Shawn Stall RN, BSN Entered By: Shawn Stall on 08/23/2022 11:54:50 -------------------------------------------------------------------------------- Clinic Level of Care Assessment Details Patient Name: Date of Service: CO Deliah Goody Cardenas. 08/23/2022 11:15 A M Medical Record Number: [id number] Patient Account Number: [id number] Date of Birth/Sex: Treating RN: 23-Oct-1950 (72 y.o. [name], [name] Primary Care Patrina Andreas: Jayme Cloud., [name] Other Clinician: Referring Reggie Welge: Treating Charmagne Buhl/Extender: Crissie Figures in Treatment: 33 Clinic Level of Care  Assessment Items TOOL 4 Quantity Score X- 1 0 Use when only an EandM is performed on FOLLOW-UP visit ASSESSMENTS - Nursing Assessment / Reassessment X- 1 10 Reassessment of Co-morbidities (includes updates in patient status) X- 1 5 Reassessment of Adherence to Treatment Plan ASSESSMENTS - Wound and Skin A ssessment / Reassessment X - Simple Wound Assessment / Reassessment - one wound 1 5 []  - 0 Complex Wound Assessment / Reassessment - multiple wounds []  - 0 Dermatologic / Skin Assessment (not related to wound area) ASSESSMENTS - Focused Assessment []  - 0 Circumferential Edema Measurements - multi extremities []  - 0 Nutritional Assessment / Counseling / Intervention [name], [name] ([id number]) [phone].pdf Page 2 of 5 []  - 0 Lower Extremity Assessment (monofilament, tuning fork, pulses) []  - 0 Peripheral Arterial Disease Assessment (using hand held doppler) ASSESSMENTS - Ostomy and/or Continence Assessment and Care []  - 0 Incontinence Assessment and Management []  - 0 Ostomy Care Assessment and Management (repouching, etc.) PROCESS - Coordination of Care X - Simple Patient / Family Education for ongoing care 1 15 []  - 0 Complex (extensive) Patient / Family Education for ongoing care X- 1 10 Staff obtains Chiropractor, Records, T Results / Process Orders est []  - 0 Staff telephones HHA, Nursing Homes / Clarify orders / etc []  - 0 Routine Transfer to another Facility (non-emergent condition) []  - 0 Routine Hospital Admission (non-emergent condition) []  - 0 New Admissions / Manufacturing engineer / Ordering NPWT Apligraf, etc. , []  - 0 Emergency Hospital Admission (emergent condition) X- 1 10 Simple Discharge Coordination []  - 0 Complex (extensive) Discharge Coordination PROCESS - Special Needs []  - 0 Pediatric / Minor Patient Management []  - 0 Isolation Patient Management []  - 0 Hearing / Language / Visual special needs []  -  0 Assessment of Community assistance (transportation, D/C planning, etc.) []  - 0 Additional assistance / Altered mentation []  - 0 Support Surface(s) Assessment (bed, cushion, seat, etc.) INTERVENTIONS - Wound  Cleansing / Measurement X - Simple Wound Cleansing - one wound 1 5 []  - 0 Complex Wound Cleansing - multiple wounds X- 1 5 Wound Imaging (photographs - any number of wounds) []  - 0 Wound Tracing (instead of photographs) X- 1 5 Simple Wound Measurement - one wound []  - 0 Complex Wound Measurement - multiple wounds INTERVENTIONS - Wound Dressings X - Small Wound Dressing one or multiple wounds 1 10 []  - 0 Medium Wound Dressing one or multiple wounds []  - 0 Large Wound Dressing one or multiple wounds []  - 0 Application of Medications - topical []  - 0 Application of Medications - injection INTERVENTIONS - Miscellaneous []  - 0 External ear exam []  - 0 Specimen Collection (cultures, biopsies, blood, body fluids, etc.) []  - 0 Specimen(s) / Culture(s) sent or taken to Lab for analysis []  - 0 Patient Transfer (multiple staff / Nurse, adult / Similar devices) []  - 0 Simple Staple / Suture removal (25 or less) []  - 0 Complex Staple / Suture removal (26 or more) []  - 0 Hypo / Hyperglycemic Management (close monitor of Blood Glucose) [name], [name] ([id number]) [id number] Page 3 of 5 []  - 0 Ankle / Brachial Index (ABI) - do not check if billed separately []  - 0 Vital Signs Has the patient been seen at the hospital within the last three years: Yes Total Score: 80 Level Of Care: New/Established - Level 3 Electronic Signature(s) Signed: 08/23/2022 5:08:12 PM By: Shawn Stall RN, BSN Entered By: Shawn Stall on 08/23/2022 12:29:05 -------------------------------------------------------------------------------- Encounter Discharge Information Details Patient Name: Date of Service: [name]. 08/23/2022 11:15 A M Medical Record Number:  [id number] Patient Account Number: [id number] Date of Birth/Sex: Treating RN: [birth year] (72 y.o. [name], [name] Primary Care Abbigail Anstey: Jayme Cloud., [name] Other Clinician: Referring Timothee Gali: Treating Lavance Beazer/Extender: Crissie Figures in Treatment: 33 Encounter Discharge Information Items Discharge Condition: Stable Ambulatory Status: Ambulatory Discharge Destination: Home Transportation: Private Auto Accompanied By: self Schedule Follow-up Appointment: Yes Clinical Summary of Care: Electronic Signature(s) Signed: 08/23/2022 5:08:12 PM By: Shawn Stall RN, BSN Entered By: Shawn Stall on 08/23/2022 12:17:23 -------------------------------------------------------------------------------- Patient/Caregiver Education Details Patient Name: Date of Service: [name] 6/14/2024andnbsp11:15 A M Medical Record Number: [id number] Patient Account Number: [id number] Date of Birth/Gender: Treating RN: Dec 12, 1950 (72 y.o. [name], [name] Primary Care Physician: Jayme Cloud., [name] Other Clinician: Referring Physician: Treating Physician/Extender: Allena Katz., [name] in Treatment: 33 Education Assessment Education Provided To: Patient Education Topics Provided Wound/Skin Impairment: Handouts: Caring for Your Ulcer Methods: Explain/Verbal Responses: Reinforcements needed Electronic Signature(s) Signed: 08/23/2022 5:08:12 PM By: Shawn Stall RN, BSN [name], [name] ([id number]) By: Shawn Stall RN, BSN [id number] Page 4 of 5 Signed: 08/23/2022 5:08:12 PM Entered By: Shawn Stall on 08/23/2022 12:12:04 -------------------------------------------------------------------------------- Wound Assessment Details Patient Name: Date of Service: [name]. 08/23/2022 11:15 A M Medical Record Number: [id number] Patient Account Number: [id number] Date of Birth/Sex: Treating RN: Aug 05, 1950 (72 y.o. [name], [name] Primary Care Berel Najjar: Jayme Cloud., [name] Other Clinician: Referring [name]: Treating Montrez Marietta/Extender: Crissie Figures in Treatment: 33 Wound Status Wound Number: 1 Primary Etiology: Pressure Ulcer Wound Location: Sacrum Wound Status: Open Wounding Event: Pressure Injury Date Acquired: 10/09/2021 Weeks Of Treatment: 33 Clustered Wound: No Wound Measurements Length: (cm) 0.2 Width: (cm) 0.2 Depth: (cm) 0.6 Area: (cm) 0.031 Volume: (cm) 0.019 % Reduction in Area: 91.2% % Reduction in Volume: 92.3% Wound Description Classification: Category/Stage IV  Exudate Amount: Medium Exudate Type: Serosanguineous Exudate Color: red, brown Periwound Skin Texture Texture Color No Abnormalities Noted: No No Abnormalities Noted: No Moisture No Abnormalities Noted: No Treatment Notes Wound #1 (Sacrum) Cleanser Normal Saline Discharge Instruction: Cleanse the wound with Normal Saline prior to applying a clean dressing using gauze sponges, not tissue or cotton balls. Vashe 5.8 (oz) Discharge Instruction: Cleanse the wound with Vashe prior to applying a clean dressing using gauze sponges, not tissue or cotton balls. Peri-Wound Care Skin Prep Discharge Instruction: Use skin prep as directed Topical Primary Dressing Endoform 2x2 in Discharge Instruction: Moisten with saline Secondary Dressing Woven Gauze Sponge, Non-Sterile 4x4 in Discharge Instruction: Apply over primary dressing as directed. Zetuvit Plus Silicone Border Dressing 7x7(in/in) Discharge Instruction: Apply silicone border over primary dressing as directed. [name], [name] ([id number]) [id number] Page 5 of 5 Secured With Compression Wrap Compression Stockings Facilities manager) Signed: 08/23/2022 5:08:12 PM By: Shawn Stall RN, BSN Entered By: Shawn Stall on 08/23/2022 11:54:58

## 2022-08-26 ENCOUNTER — Encounter (HOSPITAL_BASED_OUTPATIENT_CLINIC_OR_DEPARTMENT_OTHER): Payer: 59 | Admitting: Internal Medicine

## 2022-08-26 ENCOUNTER — Ambulatory Visit (HOSPITAL_BASED_OUTPATIENT_CLINIC_OR_DEPARTMENT_OTHER): Payer: 59 | Admitting: Internal Medicine

## 2022-08-26 DIAGNOSIS — M129 Arthropathy, unspecified: Secondary | ICD-10-CM | POA: Diagnosis not present

## 2022-08-26 DIAGNOSIS — Z79899 Other long term (current) drug therapy: Secondary | ICD-10-CM | POA: Diagnosis not present

## 2022-08-26 DIAGNOSIS — Z131 Encounter for screening for diabetes mellitus: Secondary | ICD-10-CM | POA: Diagnosis not present

## 2022-08-26 DIAGNOSIS — E7800 Pure hypercholesterolemia, unspecified: Secondary | ICD-10-CM | POA: Diagnosis not present

## 2022-08-26 DIAGNOSIS — R64 Cachexia: Secondary | ICD-10-CM | POA: Diagnosis not present

## 2022-08-26 DIAGNOSIS — K861 Other chronic pancreatitis: Secondary | ICD-10-CM | POA: Diagnosis not present

## 2022-08-26 DIAGNOSIS — G4700 Insomnia, unspecified: Secondary | ICD-10-CM | POA: Diagnosis not present

## 2022-08-26 DIAGNOSIS — M542 Cervicalgia: Secondary | ICD-10-CM | POA: Diagnosis not present

## 2022-08-26 DIAGNOSIS — M6281 Muscle weakness (generalized): Secondary | ICD-10-CM | POA: Diagnosis not present

## 2022-08-26 DIAGNOSIS — E43 Unspecified severe protein-calorie malnutrition: Secondary | ICD-10-CM | POA: Diagnosis not present

## 2022-08-26 DIAGNOSIS — Z681 Body mass index (BMI) 19 or less, adult: Secondary | ICD-10-CM | POA: Diagnosis not present

## 2022-08-26 DIAGNOSIS — M8648 Chronic osteomyelitis with draining sinus, other site: Secondary | ICD-10-CM | POA: Diagnosis not present

## 2022-08-26 DIAGNOSIS — L89154 Pressure ulcer of sacral region, stage 4: Secondary | ICD-10-CM | POA: Diagnosis not present

## 2022-08-26 DIAGNOSIS — F32A Depression, unspecified: Secondary | ICD-10-CM | POA: Diagnosis not present

## 2022-08-26 DIAGNOSIS — R5383 Other fatigue: Secondary | ICD-10-CM | POA: Diagnosis not present

## 2022-08-26 DIAGNOSIS — E559 Vitamin D deficiency, unspecified: Secondary | ICD-10-CM | POA: Diagnosis not present

## 2022-08-26 DIAGNOSIS — L89153 Pressure ulcer of sacral region, stage 3: Secondary | ICD-10-CM | POA: Diagnosis not present

## 2022-08-26 DIAGNOSIS — E2740 Unspecified adrenocortical insufficiency: Secondary | ICD-10-CM | POA: Diagnosis not present

## 2022-08-26 NOTE — Progress Notes (Signed)
NOLENE, STAHL R ([id number]) [id number] Page 1 of 1 Visit Report for 08/26/2022 SuperBill Details Patient Name: Date of Service: [name] 08/26/2022 Medical Record Number: [id number] Patient Account Number: [id number] Date of Birth/Sex: Treating RN: 13-Jan-1951 (72 y.o. F) Primary Care Provider: Jayme Cloud., [name] Other Clinician: Referring Provider: Treating Provider/Extender: Crissie Figures in Treatment: 33 Diagnosis Coding ICD-10 Codes Code Description [phone] Pressure ulcer of sacral region, stage 4 M86.48 Chronic osteomyelitis with draining sinus, other site M62.81 Muscle weakness (generalized) E43 Unspecified severe protein-calorie malnutrition Facility Procedures CPT4 Code Description Modifier Quantity [id number] [phone] - WOUND CARE VISIT-LEV 2 EST PT 1 Electronic Signature(s) Signed: 08/26/2022 4:27:49 PM By: Thayer Dallas Signed: 08/26/2022 4:37:01 PM By: Geralyn Corwin DO Entered By: Thayer Dallas on 08/26/2022 16:27:29

## 2022-08-26 NOTE — Progress Notes (Signed)
VERLINA, SHUBIN R ([id number]) [id number] Page 1 of 1 Visit Report for 08/23/2022 SuperBill Details Patient Name: Date of Service: [name] 08/23/2022 Medical Record Number: [id number] Patient Account Number: [id number] Date of Birth/Sex: Treating RN: 1950-12-07 (72 y.o. [name], [name] Primary Care Provider: Jayme Cloud., [name] Other Clinician: Referring Provider: Treating Provider/Extender: Crissie Figures in Treatment: 33 Diagnosis Coding ICD-10 Codes Code Description [phone] Pressure ulcer of sacral region, stage 4 M86.48 Chronic osteomyelitis with draining sinus, other site M62.81 Muscle weakness (generalized) E43 Unspecified severe protein-calorie malnutrition Facility Procedures CPT4 Code Description Modifier Quantity [id number] [phone] - WOUND CARE VISIT-LEV 3 EST PT 1 Electronic Signature(s) Signed: 08/23/2022 5:08:12 PM By: Shawn Stall RN, BSN Signed: 08/26/2022 12:26:35 PM By: Geralyn Corwin DO Entered By: Shawn Stall on 08/23/2022 12:42:00

## 2022-08-26 NOTE — Progress Notes (Signed)
[name], [name] ([id number]) [id number] Page 1 of 5 Visit Report for 08/26/2022 Arrival Information Details Patient Name: Date of Service: [name]. 08/26/2022 3:00 PM Medical Record Number: [id number] Patient Account Number: [id number] Date of Birth/Sex: Treating RN: 25-Nov-1950 (72 y.o. F) Primary Care Travus Oren: Jayme Cloud., [name] Other Clinician: Referring Raun Routh: Treating Dalana Pfahler/Extender: Crissie Figures in Treatment: 33 Visit Information History Since Last Visit Added or deleted any medications: No Patient Arrived: Ambulatory Any new allergies or adverse reactions: No Arrival Time: 16:17 Had a fall or experienced change in No Accompanied By: self activities of daily living that may affect Transfer Assistance: None risk of falls: Patient Identification Verified: Yes Signs or symptoms of abuse/neglect since last visito No Secondary Verification Process Completed: Yes Hospitalized since last visit: No Patient Requires Transmission-Based Precautions: No Implantable device outside of the clinic excluding No Patient Has Alerts: No cellular tissue based products placed in the center since last visit: Pain Present Now: No Electronic Signature(s) Signed: 08/26/2022 4:27:49 PM By: Thayer Dallas Entered By: Thayer Dallas on 08/26/2022 16:17:33 -------------------------------------------------------------------------------- Clinic Level of Care Assessment Details Patient Name: Date of Service: [name]. 08/26/2022 3:00 PM Medical Record Number: [id number] Patient Account Number: [id number] Date of Birth/Sex: Treating RN: May 05, 1950 (72 y.o. F) Primary Care Cerina Leary: Jayme Cloud., Mort Sawyers Clinician: Thayer Dallas Referring Marquetta Weiskopf: Treating Kortland Nichols/Extender: Allena Katz., [name] in Treatment: 33 Clinic Level of Care Assessment Items TOOL 4 Quantity Score X- 1 0 Use when only  an EandM is performed on FOLLOW-UP visit ASSESSMENTS - Nursing Assessment / Reassessment X- 1 10 Reassessment of Co-morbidities (includes updates in patient status) X- 1 5 Reassessment of Adherence to Treatment Plan ASSESSMENTS - Wound and Skin A ssessment / Reassessment X - Simple Wound Assessment / Reassessment - one wound 1 5 []  - 0 Complex Wound Assessment / Reassessment - multiple wounds []  - 0 Dermatologic / Skin Assessment (not related to wound area) ASSESSMENTS - Focused Assessment []  - 0 Circumferential Edema Measurements - multi extremities []  - 0 Nutritional Assessment / Counseling / Intervention []  - 0 Lower Extremity Assessment (monofilament, tuning fork, pulses) FLARA, BESLER R ([id number]) [id number] Page 2 of 5 []  - 0 Peripheral Arterial Disease Assessment (using hand held doppler) ASSESSMENTS - Ostomy and/or Continence Assessment and Care []  - 0 Incontinence Assessment and Management []  - 0 Ostomy Care Assessment and Management (repouching, etc.) PROCESS - Coordination of Care X - Simple Patient / Family Education for ongoing care 1 15 []  - 0 Complex (extensive) Patient / Family Education for ongoing care []  - 0 Staff obtains Chiropractor, Records, T Results / Process Orders est []  - 0 Staff telephones HHA, Nursing Homes / Clarify orders / etc []  - 0 Routine Transfer to another Facility (non-emergent condition) []  - 0 Routine Hospital Admission (non-emergent condition) []  - 0 New Admissions / Manufacturing engineer / Ordering NPWT Apligraf, etc. , []  - 0 Emergency Hospital Admission (emergent condition) X- 1 10 Simple Discharge Coordination []  - 0 Complex (extensive) Discharge Coordination PROCESS - Special Needs []  - 0 Pediatric / Minor Patient Management []  - 0 Isolation Patient Management []  - 0 Hearing / Language / Visual special needs []  - 0 Assessment of Community assistance (transportation, D/C planning,  etc.) []  - 0 Additional assistance / Altered mentation []  - 0 Support Surface(s) Assessment (bed, cushion, seat, etc.) INTERVENTIONS - Wound Cleansing / Measurement X - Simple Wound Cleansing - one wound 1 5 []  -  0 Complex Wound Cleansing - multiple wounds []  - 0 Wound Imaging (photographs - any number of wounds) []  - 0 Wound Tracing (instead of photographs) []  - 0 Simple Wound Measurement - one wound []  - 0 Complex Wound Measurement - multiple wounds INTERVENTIONS - Wound Dressings X - Small Wound Dressing one or multiple wounds 1 10 []  - 0 Medium Wound Dressing one or multiple wounds []  - 0 Large Wound Dressing one or multiple wounds []  - 0 Application of Medications - topical []  - 0 Application of Medications - injection INTERVENTIONS - Miscellaneous []  - 0 External ear exam []  - 0 Specimen Collection (cultures, biopsies, blood, body fluids, etc.) []  - 0 Specimen(s) / Culture(s) sent or taken to Lab for analysis []  - 0 Patient Transfer (multiple staff / Nurse, adult / Similar devices) []  - 0 Simple Staple / Suture removal (25 or less) []  - 0 Complex Staple / Suture removal (26 or more) []  - 0 Hypo / Hyperglycemic Management (close monitor of Blood Glucose) []  - 0 Ankle / Brachial Index (ABI) - do not check if billed separately ROSALY, MERKLEY R ([id number]) [phone] Page 3 of 5 []  - 0 Vital Signs Has the patient been seen at the hospital within the last three years: Yes Total Score: 60 Level Of Care: New/Established - Level 2 Electronic Signature(s) Signed: 08/26/2022 4:27:49 PM By: Thayer Dallas Entered By: Thayer Dallas on 08/26/2022 16:26:40 -------------------------------------------------------------------------------- Encounter Discharge Information Details Patient Name: Date of Service: Marissa Berthold R. 08/26/2022 3:00 PM Medical Record Number: [id number] Patient Account Number: [id number] Date of Birth/Sex: Treating  RN: [birth year] (72 y.o. F) Primary Care Keniya Schlotterbeck: Jayme Cloud., Mort Sawyers Clinician: Thayer Dallas Referring Macsen Nuttall: Treating Royale Swamy/Extender: Allena Katz., [name] in Treatment: 33 Encounter Discharge Information Items Discharge Condition: Stable Ambulatory Status: Ambulatory Discharge Destination: Home Transportation: Private Auto Accompanied By: self Schedule Follow-up Appointment: Yes Clinical Summary of Care: Electronic Signature(s) Signed: 08/26/2022 4:27:49 PM By: Thayer Dallas Entered By: Thayer Dallas on 08/26/2022 16:27:16 -------------------------------------------------------------------------------- Patient/Caregiver Education Details Patient Name: Date of Service: [name] 6/17/2024andnbsp3:00 PM Medical Record Number: [id number] Patient Account Number: [id number] Date of Birth/Gender: Treating RN: January 17, 1951 (72 y.o. F) Primary Care Physician: Jayme Cloud., [name] Other Clinician: Thayer Dallas Referring Physician: Treating Physician/Extender: Crissie Figures in Treatment: 33 Education Assessment Education Provided To: Patient Education Topics Provided Electronic Signature(s) Signed: 08/26/2022 4:27:49 PM By: Thayer Dallas Entered By: Thayer Dallas on 08/26/2022 16:27:01 [name] ([id number]) [id number] Page 4 of 5 -------------------------------------------------------------------------------- Wound Assessment Details Patient Name: Date of Service: Marissa Berthold R. 08/26/2022 3:00 PM Medical Record Number: [id number] Patient Account Number: [id number] Date of Birth/Sex: Treating RN: [birth year] (72 y.o. F) Primary Care Ahmeer Tuman: Jayme Cloud., [name] Other Clinician: Referring Kanda Deluna: Treating Purl Claytor/Extender: Crissie Figures in Treatment: 33 Wound Status Wound Number: 1 Primary Etiology: Pressure Ulcer Wound Location:  Sacrum Wound Status: Open Wounding Event: Pressure Injury Date Acquired: 10/09/2021 Weeks Of Treatment: 33 Clustered Wound: No Wound Measurements Length: (cm) 0.2 Width: (cm) 0.2 Depth: (cm) 0.6 Area: (cm) 0.031 Volume: (cm) 0.019 % Reduction in Area: 91.2% % Reduction in Volume: 92.3% Wound Description Classification: Category/Stage IV Exudate Amount: Medium Exudate Type: Serosanguineous Exudate Color: red, brown Periwound Skin Texture Texture Color No Abnormalities Noted: No No Abnormalities Noted: No Moisture No Abnormalities Noted: No Treatment Notes Wound #1 (Sacrum) Cleanser Normal Saline Discharge Instruction: Cleanse the wound with Normal Saline prior to applying  a clean dressing using gauze sponges, not tissue or cotton balls. Vashe 5.8 (oz) Discharge Instruction: Cleanse the wound with Vashe prior to applying a clean dressing using gauze sponges, not tissue or cotton balls. Peri-Wound Care Skin Prep Discharge Instruction: Use skin prep as directed Topical Primary Dressing Endoform 2x2 in Discharge Instruction: Moisten with saline Secondary Dressing Woven Gauze Sponge, Non-Sterile 4x4 in Discharge Instruction: Apply over primary dressing as directed. Zetuvit Plus Silicone Border Dressing 7x7(in/in) Discharge Instruction: Apply silicone border over primary dressing as directed. Secured With [employer] Compression Stockings AMAN, NOURSE R ([id number]) [id number] Page 5 of 5 Add-Ons Electronic Signature(s) Signed: 08/26/2022 4:27:49 PM By: Thayer Dallas Entered By: Thayer Dallas on 08/26/2022 16:17:49 -------------------------------------------------------------------------------- Vitals Details Patient Name: Date of Service: [name], Brooks Sailors R. 08/26/2022 3:00 PM Medical Record Number: [id number] Patient Account Number: [id number] Date of Birth/Sex: Treating RN: June 19, 1950 (72 y.o. F) Primary Care Jimmie Rueter: Jayme Cloud.,  [name] Other Clinician: Referring Shakeila Pfarr: Treating Murlean Seelye/Extender: Crissie Figures in Treatment: 33 Vital Signs Time Taken: 16:17 Reference Range: 80 - 120 mg / dl Height (in): 64 Weight (lbs): 97 Body Mass Index (BMI): 16.6 Electronic Signature(s) Signed: 08/26/2022 4:27:49 PM By: Thayer Dallas Entered By: Thayer Dallas on 08/26/2022 16:17:40

## 2022-08-28 ENCOUNTER — Encounter (HOSPITAL_BASED_OUTPATIENT_CLINIC_OR_DEPARTMENT_OTHER): Payer: 59 | Admitting: Physician Assistant

## 2022-08-28 DIAGNOSIS — L89153 Pressure ulcer of sacral region, stage 3: Secondary | ICD-10-CM | POA: Diagnosis not present

## 2022-08-28 DIAGNOSIS — Z681 Body mass index (BMI) 19 or less, adult: Secondary | ICD-10-CM | POA: Diagnosis not present

## 2022-08-28 DIAGNOSIS — M6281 Muscle weakness (generalized): Secondary | ICD-10-CM | POA: Diagnosis not present

## 2022-08-28 DIAGNOSIS — M8648 Chronic osteomyelitis with draining sinus, other site: Secondary | ICD-10-CM | POA: Diagnosis not present

## 2022-08-28 DIAGNOSIS — E43 Unspecified severe protein-calorie malnutrition: Secondary | ICD-10-CM | POA: Diagnosis not present

## 2022-08-28 DIAGNOSIS — L89154 Pressure ulcer of sacral region, stage 4: Secondary | ICD-10-CM | POA: Diagnosis not present

## 2022-08-29 DIAGNOSIS — Z79899 Other long term (current) drug therapy: Secondary | ICD-10-CM | POA: Diagnosis not present

## 2022-08-30 ENCOUNTER — Encounter (HOSPITAL_BASED_OUTPATIENT_CLINIC_OR_DEPARTMENT_OTHER): Payer: 59 | Admitting: Internal Medicine

## 2022-08-30 DIAGNOSIS — Z681 Body mass index (BMI) 19 or less, adult: Secondary | ICD-10-CM | POA: Diagnosis not present

## 2022-08-30 DIAGNOSIS — M6281 Muscle weakness (generalized): Secondary | ICD-10-CM | POA: Diagnosis not present

## 2022-08-30 DIAGNOSIS — L89153 Pressure ulcer of sacral region, stage 3: Secondary | ICD-10-CM | POA: Diagnosis not present

## 2022-08-30 DIAGNOSIS — E43 Unspecified severe protein-calorie malnutrition: Secondary | ICD-10-CM | POA: Diagnosis not present

## 2022-08-30 DIAGNOSIS — L89154 Pressure ulcer of sacral region, stage 4: Secondary | ICD-10-CM | POA: Diagnosis not present

## 2022-08-30 DIAGNOSIS — M8648 Chronic osteomyelitis with draining sinus, other site: Secondary | ICD-10-CM | POA: Diagnosis not present

## 2022-08-30 NOTE — Progress Notes (Signed)
[name] ([id number]) [id number] Page 1 of 8 Visit Report for 08/28/2022 Chief Complaint Document Details Patient Name: Date of Service: [name]. 08/28/2022 11:00 A M Medical Record Number: [id number] Patient Account Number: [id number] Date of Birth/Sex: Treating RN: 1950/12/23 (72 y.o. F) Primary Care Provider: Jayme Cloud., Molly Cardenas Other Clinician: Referring Provider: Treating Provider/Extender: [name] in Treatment: 34 Information Obtained from: Patient Chief Complaint Pressure ulcer stage 3 Sacrum Electronic Signature(s) Signed: 08/28/2022 11:55:38 AM By: Marissa Derry PA-C Entered By: [name] on 08/28/2022 11:55:38 -------------------------------------------------------------------------------- HPI Details Patient Name: Date of Service: [name]. 08/28/2022 11:00 A M Medical Record Number: [id number] Patient Account Number: [id number] Date of Birth/Sex: Treating RN: [birth year] (72 y.o. F) Primary Care Provider: Jayme Cloud., Molly Cardenas Other Clinician: Referring Provider: Treating Provider/Extender: [name] in Treatment: 34 History of Present Illness HPI Description: 01-02-22 upon evaluation today patient presents for initial inspection here in our clinic concerning a wound that she has over the sacral region. This is stated to be present since around the beginning of August 2023. She currently resides in a assisted nursing facility. She does seem to be able to answer questions fully today. Subsequently I do note that she is a little on the thin side but again other than the protein calorie malnutrition she is minimally weak but still does get up and move around some but she also tells me that she "sits a lot". She has not had any x-rays of the sacral region at this point. 01-09-2022 upon evaluation today patient's wound in the sacral area actually appears to be doing  decently well. Fortunately I do not see any signs of infection which is great news and overall I am extremely pleased with where things stand today. 11/8; this is a patient who lives in some form of small assisted living in [place]. She has a deep wound over the lower part of her coccyx. An x-ray that we ordered from last time did not show any osseous abnormalities. We are supposed to be using Hydrofera Blue in the wound but I am really not sure what they are using to dress this and who is doing it. Talking to our staff they have apparently discussed this with the staff in the facility. She does not have home health. 01-23-2022 upon evaluation today patient appears to be doing decently well in regard to her wound. She has been tolerating the dressing changes without complication although I am not certain the dressing changes have been done appropriately over the past couple of weeks. We have been trying to get her to come in here we also try to get her into a wound care center in [place] which would be closer for the assisted living facility. Unfortunately neither 1 of those were undertaken up to this point. The patient did end up having some training of the staff from an RN that they brought him to have the staff perform the dressing changes but that being said it does not sound like this has been done correctly. Nonetheless I do believe that based on what we see it would benefit the patient to actually have her come here 3 times a week also think a wound VAC could potentially be a possibility for her which would help to get things moving in a much better direction as far as healing is concerned. We need to get this to fill-in though it looks clean and  does not look infected I do think that we need to really get this to fill-in and there is quite a bit of undermining that is to be considered here. 01-30-2022 upon evaluation today patient appears to be doing well currently in regard to her wound which  is looking pretty decent but still has quite a bit of space underneath as far as undermining is concerned. I think she might possibly be better with a wound VAC. With that being said if when I do this we probably only be able to do it 2 times a week at most. I discussed that with the patient today she is okay with that we just need to see if we can get the insurance approval and then of course the scheduling underway. 02-13-2022 upon evaluation today patient appears to be doing well currently in regard to her wound. She actually tells me that it is feeling a lot better which is good news. Fortunately I do not see any signs of active infection at this time. No fevers, chills, nausea, vomiting, or diarrhea. 02-27-2022 upon evaluation today patient appears to be doing well currently in regard to her wound. She is actually showing some signs of improvement we are [name] ([id number]) [id number] Page 2 of 8 still obtaining the approval for the snap VAC which I think could be beneficial in the meantime we been using the Hydrofera Blue rope. 03-27-2022 upon evaluation today patient appears to be doing okay in regard to her wound but there was no dressing in place upon arrival today. Fortunately I see again no evidence of infection. No fevers, chills, nausea, vomiting, or diarrhea. 04-03-2022 upon evaluation today patient did not have a dressing on when she came into the office. With that being said she actually tells me that it was on until yesterday she took a shower and it came loose and then today came off completely. With that being said I really do not think she needs to be taking shower every single day or having to see her here in the clinic 3 times per week on Monday, Wednesday, and Friday and during those times obviously in between she needs to probably avoid showering. For that reason what I advised her to do would be to shower in the mornings on the days that she  comes to see [place] that way if the dressing does come off or start to come off we will be changing it anyway. She voiced understanding and tells me she can do that. Unfortunately the facility has nobody to help her with changing the dressings and we are not currently having to do that here in the clinic again. This means that during the time that she was not coming in from December 20 through when I saw her last week on the 17th there was a period of 2 weeks where the wound really was not changed at all as the RN that Baird Lyons tell me they had at the facility did not end up working out. This obviously is not good and is not what we expect to see as far as patient care is concerned which is why we are now seeing her 3 times a week here in the clinic. 04-10-2022 upon evaluation today patient appears to be doing well currently in regard to her wound which is actually showing signs of good granulation epithelization at this point. However she does have quite a bit of drainage and we are little concerned about the possibility of infection. For  that reason I think she could benefit from a PCR culture followed by [facility] topical antibiotics. I am also thinking of going ahead and doing gentamicin today as well. 2/7; small wound on the lower sacrum however with considerable degree of undermining from roughly 7-4. PCR culture that was done last week showed "no organisms". We have been using gentamicin and iodoform packing.She lives in some form of group home in [place] I believe. Uncertain about the adequacy of offloading this area 04-24-2022 upon evaluation patient's wound actually is showing signs of doing decently well I do believe she would benefit from a snap VAC and we discussed that again today. Fortunately I do not see any signs of active infection locally nor systemically which is great news. No fevers, chills, nausea, vomiting, or diarrhea. 05-01-2022 upon evaluation today patient's wound is really doing  about the same. Fortunately I do not see any signs of active infection locally nor systemically at this time which is great news with that being said we have gotten approval for the snap VAC and we will going to go ahead and place that today. 05-08-2022 upon evaluation today patient appears to be doing well currently in regard to her wound all things considered but were having a difficult time with a snap VAC suctioning appropriately. With that being said we will get a go ahead and likely avoid the snap VAC at this point at least until we can get the actual brace dressing as bridging ourselves does not seem to be working nearly as well. 3/6; I note the difficulty with a snap VAC which is disappointing but apparently another type of VAC is being considered. Also a referral to plastic surgery at [facility]. The patient has a very small but deep and at the base circumferential undermining. She is coming in here now 3 times a week for [facility] placement 3/13; patient presents for follow-up. She has been using Hydrofera Blue 3 times a week. She comes into our clinic for nurse visits to have this done. We are still awaiting a wound VAC. She has no issues or complaints today. 05-29-2022 upon evaluation today patient appears to be doing about the same in regard to her wound. She still has significant undermining although the opening is a huge the undermining is significant. I do believe that a gauze VAC would be beneficial here. The patient does not eat normally and in fact tells me that she eats very well. She also is doing supplements with Juven at this point. She also has a good ability to get up and move around she is completely ambulatory and does not just lay with pressure on this area. She also is able to roll and reposition in bed without complication. For that reason she really has no need for a group 2 mattress at this point whatsoever this would be a complete waste of resources as far as insurance  and medical equipment is concerned. As far as her urinary incontinence she has no issues with incontinence at this point she is able to carry herself to the bathroom when needed she does wear a depends just in case but again that is not a routinely utilized item. 06-05-2022 upon evaluation today patient appears to be doing decently well in regard to her wound I do not see any signs of infection overall she seems to be doing quite well. I do think that she is making good progress in general here. We do have the wound VAC however and I think  this is going to potentially help to get this. Hand has been the biggest issue we have had is getting this to actually feeling more effectively. 06-12-2022 upon evaluation today patient appears to be doing okay in regard to her wound. We are still seeing some signs of improvement. Fortunately there does not appear to be any evidence of active infection at this time which is good news she did see the doctors over at [facility] yesterday and they are ordering an MRI in preparation for considering possibility of a surgical closure. The patient voiced understanding she had forgotten we have made this referral. T be o honest I was back in February kind of forgot about the referral to in the interim trying to proceed with her wound care. Nonetheless I appreciate their be evaluation of this and again we will see what the MRI shows. 06-19-2022 upon evaluation today patient's wound is actually showing signs of good improvement and very pleased with where we stand today. Fortunately I do not see any evidence of infection locally nor systemically which is great news I think the wound VAC is doing a good job of undermining seems less than last week. 06-26-2022 upon evaluation today patient appears to be doing well with regard to her wound although her wound VAC it actually died she had forgotten to charge it overnight. With that being said she does seem to be doing decently well at  this point based on what I am seeing I think that the undermining is much less than what it was previous. Fortunately I do not see any signs of active infection locally nor systemically at this time. 07-03-2022 upon evaluation today patient appears to be doing decently well in regard to her wound. She actually did have an MRI performed and I did review that today as well. It shows that she actually does have a focal marrow signal abnormality in the left aspect of the sacrum at the S4 level with adjacent soft tissue swelling and presumed sacral decubitus ulcer concerning for a small area of osteomyelitis. Otherwise there did not appear to be any fluid collection which is good news there is no signs of an obvious abscess. 07-10-2022 upon evaluation today patient appears to be doing well currently in regard to her wound. She has been tolerating the dressing changes without complication. Fortunately there does not appear to be any signs of active infection which is good news. 07-17-2022 upon evaluation today patient unfortunately does not seem to be making progress with the gauze VAC. We have given her a good trial with this and unfortunately I do not think that we are seeing any improvement for that reason I think it would be best to send this back and not continue therapy with the gauze back at this point. The patient voiced understanding and she is in agreement with that plan. She does have an appointment with the plastic surgeon at [facility] it was supposed to be yesterday but apparently got moved next Tuesday, May 14. 07-31-2022 upon evaluation today patient appears to be doing well currently in regard to her wound all things considered. She does have osteomyelitis of the sacral area which is noted. With that being said I think that the [facility] Blue rope has done decently well but I do believe that she may benefit from switching to endoform and just packed this very well with endoform. As it starts to  dissolve it would not be able to be over packed. She is in agreement with giving  this a trial. 08-07-2022 upon evaluation today patient appears to be doing really about the same in regard to her wound. The endoform I think may be doing a little bit better than the [facility] but were still not seeing a lot of progress here. This is unfortunate as again we are trying to get this healed as quickly as possible. Nonetheless I do believe that she would benefit from a likely antibiotic course as well as potentially hyperbaric oxygen therapy. With that being said I discussed with the patient at this point that we need to likely see about getting the labs done as it does not look like they were ever done back in April at the end when I ordered this. They were supposed to be done but we never received any results. Nonetheless at this point I think we would need updated results anyway in order to determine where we would need to go currently. She voiced understanding as did the individual that was with her today. [name], [name] ([id number]) [id number] Page 3 of 8 08-14-2022 upon evaluation today patient appears to be doing well currently in regard to her wound. This is any better but is also not any worse. We have been doing a good job keeping it under control. With that being said she did see Dr. Roxan Hockey at [facility] and they are actually proceed with a flap surgery. Again this I think is probably to be the primary best way to get this closed and she is in agreement with that plan she is actually very happy about the plan. 08-21-2022 upon evaluation patient appears to be doing about the same as compared to last week's evaluation. There is really no significant improvement overall in size or depth. She is still awaiting the appointment for the flap surgery. I did talk to the facility today and they are going to reach out to [facility] to try to figure out what the plan is as far as  getting her scheduled. 08-28-2022 upon evaluation today patient appears to be doing about the same in regard to her wound. The good news is she is not any worse the bad news is she is also not really significantly better but they are still waiting on the scheduling for the surgery at [facility]. That we will happen at some point right now it is in the insurance approval phase apparently based on what I am hearing. Electronic Signature(s) Signed: 09/03/2022 8:19:38 AM By: Marissa Derry PA-C Entered By: [name] on 09/03/2022 08:19:38 -------------------------------------------------------------------------------- Physical Exam Details Patient Name: Date of Service: [name]. 08/28/2022 11:00 A M Medical Record Number: [id number] Patient Account Number: [id number] Date of Birth/Sex: Treating RN: August 11, 1950 (72 y.o. F) Primary Care Provider: Jayme Cloud., Molly Cardenas Other Clinician: Referring Provider: Treating Provider/Extender: [name] in Treatment: 34 Constitutional Well-nourished and well-hydrated in no acute distress. Respiratory normal breathing without difficulty. Psychiatric this patient is able to make decisions and demonstrates good insight into disease process. Alert and Oriented x 3. pleasant and cooperative. Notes Upon inspection patient's wound bed actually still show signs of undermining especially 12:00 but fortunately I do not see any signs of overall worsening which is good news. I do believe that the patient would benefit from a continuation of therapy with the current wound care measures. Electronic Signature(s) Signed: 09/03/2022 8:19:59 AM By: Marissa Derry PA-C Entered By: [name] on 09/03/2022 08:19:59 -------------------------------------------------------------------------------- Physician Orders Details Patient Name: Date of Service: [name],  Marissa NEY Cardenas. 08/28/2022 11:00 A M Medical Record Number: [id number] Patient Account  Number: [id number] Date of Birth/Sex: Treating RN: 23-Nov-1950 (72 y.o. [name], [name] Primary Care Provider: Jayme Cloud., Molly Cardenas Other Clinician: Referring Provider: Treating Provider/Extender: [name] in Treatment: [id number] Verbal / Phone Orders: No Diagnosis Coding ICD-10 Coding Code Description L89.154 Pressure ulcer of sacral region, stage 4 M86.48 Chronic osteomyelitis with draining sinus, other site M62.81 Muscle weakness (generalized) ALLISHA, HARTER Cardenas ([id number]) [id number] Page 4 of 8 E43 Unspecified severe protein-calorie malnutrition Follow-up Appointments ppointment in 1 week. [name] III PA-C Room 7 [phone] 09/04/2022 Return A Nurse Visit: - Twice a week Mondays 0745 and Fridays 1130 Other: - will see you three times a week with dressing changes till you have your surgery with Dr. Roxan Hockey then he will take over wound care. Can cancel wound care appts when surgery is planned. Anesthetic (In clinic) Topical Lidocaine 4% applied to wound bed Negative Presssure Wound Therapy Discontinue wound vac. Call the number on the machine for the company to pick up. Off-Loading Turn and reposition every 2 hours - when sitting. walk hourly to aid in offloading pressure to buttock. Other: - PCP at [facility] talked with pateint regarding possible Hyperbaric Oxygen Therapy. (08/07/22) Wound Treatment Wound #1 - Sacrum Cleanser: Normal Saline 3 x Per Week/30 Days Discharge Instructions: Cleanse the wound with Normal Saline prior to applying a clean dressing using gauze sponges, not tissue or cotton balls. Cleanser: Vashe 5.8 (oz) 3 x Per Week/30 Days Discharge Instructions: Cleanse the wound with Vashe prior to applying a clean dressing using gauze sponges, not tissue or cotton balls. Peri-Wound Care: Skin Prep 3 x Per Week/30 Days Discharge Instructions: Use skin prep as directed Prim Dressing: Endoform 2x2 in 3 x Per Week/30  Days ary Discharge Instructions: Moisten with saline Secondary Dressing: Woven Gauze Sponge, Non-Sterile 4x4 in 3 x Per Week/30 Days Discharge Instructions: Apply over primary dressing as directed. Secondary Dressing: Zetuvit Plus Silicone Border Dressing 7x7(in/in) 3 x Per Week/30 Days Discharge Instructions: Apply silicone border over primary dressing as directed. Electronic Signature(s) Signed: 08/28/2022 3:27:24 PM By: Shawn Stall RN, BSN Signed: 08/29/2022 5:52:49 PM By: Marissa Derry PA-C Entered By: Shawn Stall on 08/28/2022 11:50:19 -------------------------------------------------------------------------------- Problem List Details Patient Name: Date of Service: Marissa Berthold Cardenas. 08/28/2022 11:00 A M Medical Record Number: [id number] Patient Account Number: [id number] Date of Birth/Sex: Treating RN: 1950-05-08 (72 y.o. [name], [name] Primary Care Provider: Jayme Cloud., Molly Cardenas Other Clinician: Referring Provider: Treating Provider/Extender: [name] in Treatment: 34 Active Problems ICD-10 Encounter Code Description Active Date MDM Diagnosis L89.154 Pressure ulcer of sacral region, stage 4 01/02/2022 No Yes M86.48 Chronic osteomyelitis with draining sinus, other site 07/31/2022 No Yes M62.81 Muscle weakness (generalized) 01/02/2022 No Yes [name], [name] ([id number]) [id number] Page 5 of 8 E43 Unspecified severe protein-calorie malnutrition 01/02/2022 No Yes Inactive Problems Resolved Problems Electronic Signature(s) Signed: 08/28/2022 11:55:28 AM By: Marissa Derry PA-C Entered By: [name] on 08/28/2022 11:55:28 -------------------------------------------------------------------------------- Progress Note Details Patient Name: Date of Service: [name]. 08/28/2022 11:00 A M Medical Record Number: [id number] Patient Account Number: [id number] Date of Birth/Sex: Treating RN: January 24, 1951 (72 y.o.  F) Primary Care Provider: Jayme Cloud., Molly Cardenas Other Clinician: Referring Provider: Treating Provider/Extender: [name] in Treatment: 34 Subjective Chief Complaint Information obtained from Patient Pressure ulcer stage 3 Sacrum History of Present Illness (  HPI) 01-02-22 upon evaluation today patient presents for initial inspection here in our clinic concerning a wound that she has over the sacral region. This is stated to be present since around the beginning of August 2023. She currently resides in a assisted nursing facility. She does seem to be able to answer questions fully today. Subsequently I do note that she is a little on the thin side but again other than the protein calorie malnutrition she is minimally weak but still does get up and move around some but she also tells me that she "sits a lot". She has not had any x-rays of the sacral region at this point. 01-09-2022 upon evaluation today patient's wound in the sacral area actually appears to be doing decently well. Fortunately I do not see any signs of infection which is great news and overall I am extremely pleased with where things stand today. 11/8; this is a patient who lives in some form of small assisted living in [place]. She has a deep wound over the lower part of her coccyx. An x-ray that we ordered from last time did not show any osseous abnormalities. We are supposed to be using Hydrofera Blue in the wound but I am really not sure what they are using to dress this and who is doing it. Talking to our staff they have apparently discussed this with the staff in the facility. She does not have home health. 01-23-2022 upon evaluation today patient appears to be doing decently well in regard to her wound. She has been tolerating the dressing changes without complication although I am not certain the dressing changes have been done appropriately over the past couple of weeks. We have been trying  to get her to come in here we also try to get her into a wound care center in [place] which would be closer for the assisted living facility. Unfortunately neither 1 of those were undertaken up to this point. The patient did end up having some training of the staff from an RN that they brought him to have the staff perform the dressing changes but that being said it does not sound like this has been done correctly. Nonetheless I do believe that based on what we see it would benefit the patient to actually have her come here 3 times a week also think a wound VAC could potentially be a possibility for her which would help to get things moving in a much better direction as far as healing is concerned. We need to get this to fill-in though it looks clean and does not look infected I do think that we need to really get this to fill-in and there is quite a bit of undermining that is to be considered here. 01-30-2022 upon evaluation today patient appears to be doing well currently in regard to her wound which is looking pretty decent but still has quite a bit of space underneath as far as undermining is concerned. I think she might possibly be better with a wound VAC. With that being said if when I do this we probably only be able to do it 2 times a week at most. I discussed that with the patient today she is okay with that we just need to see if we can get the insurance approval and then of course the scheduling underway. 02-13-2022 upon evaluation today patient appears to be doing well currently in regard to her wound. She actually tells me that it is feeling a lot better which  is good news. Fortunately I do not see any signs of active infection at this time. No fevers, chills, nausea, vomiting, or diarrhea. 02-27-2022 upon evaluation today patient appears to be doing well currently in regard to her wound. She is actually showing some signs of improvement we are still obtaining the approval for the snap VAC  which I think could be beneficial in the meantime we been using the Hydrofera Blue rope. 03-27-2022 upon evaluation today patient appears to be doing okay in regard to her wound but there was no dressing in place upon arrival today. Fortunately I see again no evidence of infection. No fevers, chills, nausea, vomiting, or diarrhea. 04-03-2022 upon evaluation today patient did not have a dressing on when she came into the office. With that being said she actually tells me that it was on until yesterday she took a shower and it came loose and then today came off completely. With that being said I really do not think she needs to be taking shower every single day or having to see her here in the clinic 3 times per week on Monday, Wednesday, and Friday and during those times obviously in between she needs to probably avoid showering. For that reason what I advised her to do would be to shower in the mornings on the days that she comes to see [place] that way if the dressing does come off or start to come off we will be changing it anyway. She voiced understanding and tells me she can do that. Unfortunately the facility has nobody to help her with changing the dressings and we are not currently having to do that here in the clinic again. This means [name], [name] ([id number]) [id number] Page 6 of 8 that during the time that she was not coming in from December 20 through when I saw her last week on the 17th there was a period of 2 weeks where the wound really was not changed at all as the RN that Baird Lyons tell me they had at the facility did not end up working out. This obviously is not good and is not what we expect to see as far as patient care is concerned which is why we are now seeing her 3 times a week here in the clinic. 04-10-2022 upon evaluation today patient appears to be doing well currently in regard to her wound which is actually showing signs of good  granulation epithelization at this point. However she does have quite a bit of drainage and we are little concerned about the possibility of infection. For that reason I think she could benefit from a PCR culture followed by [facility] topical antibiotics. I am also thinking of going ahead and doing gentamicin today as well. 2/7; small wound on the lower sacrum however with considerable degree of undermining from roughly 7-4. PCR culture that was done last week showed "no organisms". We have been using gentamicin and iodoform packing.She lives in some form of group home in [place] I believe. Uncertain about the adequacy of offloading this area 04-24-2022 upon evaluation patient's wound actually is showing signs of doing decently well I do believe she would benefit from a snap VAC and we discussed that again today. Fortunately I do not see any signs of active infection locally nor systemically which is great news. No fevers, chills, nausea, vomiting, or diarrhea. 05-01-2022 upon evaluation today patient's wound is really doing about the same. Fortunately I do not see any signs of active infection locally nor  systemically at this time which is great news with that being said we have gotten approval for the snap VAC and we will going to go ahead and place that today. 05-08-2022 upon evaluation today patient appears to be doing well currently in regard to her wound all things considered but were having a difficult time with a snap VAC suctioning appropriately. With that being said we will get a go ahead and likely avoid the snap VAC at this point at least until we can get the actual brace dressing as bridging ourselves does not seem to be working nearly as well. 3/6; I note the difficulty with a snap VAC which is disappointing but apparently another type of VAC is being considered. Also a referral to plastic surgery at [facility]. The patient has a very small but deep and at the base circumferential  undermining. She is coming in here now 3 times a week for [facility] placement 3/13; patient presents for follow-up. She has been using Hydrofera Blue 3 times a week. She comes into our clinic for nurse visits to have this done. We are still awaiting a wound VAC. She has no issues or complaints today. 05-29-2022 upon evaluation today patient appears to be doing about the same in regard to her wound. She still has significant undermining although the opening is a huge the undermining is significant. I do believe that a gauze VAC would be beneficial here. The patient does not eat normally and in fact tells me that she eats very well. She also is doing supplements with Juven at this point. She also has a good ability to get up and move around she is completely ambulatory and does not just lay with pressure on this area. She also is able to roll and reposition in bed without complication. For that reason she really has no need for a group 2 mattress at this point whatsoever this would be a complete waste of resources as far as insurance and medical equipment is concerned. As far as her urinary incontinence she has no issues with incontinence at this point she is able to carry herself to the bathroom when needed she does wear a depends just in case but again that is not a routinely utilized item. 06-05-2022 upon evaluation today patient appears to be doing decently well in regard to her wound I do not see any signs of infection overall she seems to be doing quite well. I do think that she is making good progress in general here. We do have the wound VAC however and I think this is going to potentially help to get this. Hand has been the biggest issue we have had is getting this to actually feeling more effectively. 06-12-2022 upon evaluation today patient appears to be doing okay in regard to her wound. We are still seeing some signs of improvement. Fortunately there does not appear to be any evidence of  active infection at this time which is good news she did see the doctors over at [facility] yesterday and they are ordering an MRI in preparation for considering possibility of a surgical closure. The patient voiced understanding she had forgotten we have made this referral. T be o honest I was back in February kind of forgot about the referral to in the interim trying to proceed with her wound care. Nonetheless I appreciate their be evaluation of this and again we will see what the MRI shows. 06-19-2022 upon evaluation today patient's wound is actually showing signs of good  improvement and very pleased with where we stand today. Fortunately I do not see any evidence of infection locally nor systemically which is great news I think the wound VAC is doing a good job of undermining seems less than last week. 06-26-2022 upon evaluation today patient appears to be doing well with regard to her wound although her wound VAC it actually died she had forgotten to charge it overnight. With that being said she does seem to be doing decently well at this point based on what I am seeing I think that the undermining is much less than what it was previous. Fortunately I do not see any signs of active infection locally nor systemically at this time. 07-03-2022 upon evaluation today patient appears to be doing decently well in regard to her wound. She actually did have an MRI performed and I did review that today as well. It shows that she actually does have a focal marrow signal abnormality in the left aspect of the sacrum at the S4 level with adjacent soft tissue swelling and presumed sacral decubitus ulcer concerning for a small area of osteomyelitis. Otherwise there did not appear to be any fluid collection which is good news there is no signs of an obvious abscess. 07-10-2022 upon evaluation today patient appears to be doing well currently in regard to her wound. She has been tolerating the dressing changes  without complication. Fortunately there does not appear to be any signs of active infection which is good news. 07-17-2022 upon evaluation today patient unfortunately does not seem to be making progress with the gauze VAC. We have given her a good trial with this and unfortunately I do not think that we are seeing any improvement for that reason I think it would be best to send this back and not continue therapy with the gauze back at this point. The patient voiced understanding and she is in agreement with that plan. She does have an appointment with the plastic surgeon at [facility] it was supposed to be yesterday but apparently got moved next Tuesday, May 14. 07-31-2022 upon evaluation today patient appears to be doing well currently in regard to her wound all things considered. She does have osteomyelitis of the sacral area which is noted. With that being said I think that the [facility] Blue rope has done decently well but I do believe that she may benefit from switching to endoform and just packed this very well with endoform. As it starts to dissolve it would not be able to be over packed. She is in agreement with giving this a trial. 08-07-2022 upon evaluation today patient appears to be doing really about the same in regard to her wound. The endoform I think may be doing a little bit better than the [facility] but were still not seeing a lot of progress here. This is unfortunate as again we are trying to get this healed as quickly as possible. Nonetheless I do believe that she would benefit from a likely antibiotic course as well as potentially hyperbaric oxygen therapy. With that being said I discussed with the patient at this point that we need to likely see about getting the labs done as it does not look like they were ever done back in April at the end when I ordered this. They were supposed to be done but we never received any results. Nonetheless at this point I think we would need updated  results anyway in order to determine where we would need to go  currently. She voiced understanding as did the individual that was with her today. 08-14-2022 upon evaluation today patient appears to be doing well currently in regard to her wound. This is any better but is also not any worse. We have been doing a good job keeping it under control. With that being said she did see Dr. Roxan Hockey at [facility] and they are actually proceed with a flap surgery. Again this I think is probably to be the primary best way to get this closed and she is in agreement with that plan she is actually very happy about the plan. 08-21-2022 upon evaluation patient appears to be doing about the same as compared to last week's evaluation. There is really no significant improvement overall in size or depth. She is still awaiting the appointment for the flap surgery. I did talk to the facility today and they are going to reach out to [facility] to try to figure out what the plan is as far as getting her scheduled. 08-28-2022 upon evaluation today patient appears to be doing about the same in regard to her wound. The good news is she is not any worse the bad news is she is also not really significantly better but they are still waiting on the scheduling for the surgery at [facility]. That we will happen at some point right now it is in the insurance approval phase apparently based on what I am hearing. [name], [name] ([id number]) [id number] Page 7 of 8 Objective Constitutional Well-nourished and well-hydrated in no acute distress. Vitals Time Taken: 11:48 AM, Height: 64 in, Weight: 97 lbs, BMI: 16.6, Temperature: 97.8 F, Pulse: 59 bpm, Respiratory Rate: 16 breaths/min, Blood Pressure: 116/55 mmHg. Respiratory normal breathing without difficulty. Psychiatric this patient is able to make decisions and demonstrates good insight into disease process. Alert and Oriented x 3. pleasant and  cooperative. General Notes: Upon inspection patient's wound bed actually still show signs of undermining especially 12:00 but fortunately I do not see any signs of overall worsening which is good news. I do believe that the patient would benefit from a continuation of therapy with the current wound care measures. Integumentary (Hair, Skin) Wound #1 status is Open. Original cause of wound was Pressure Injury. The date acquired was: 10/09/2021. The wound has been in treatment 34 weeks. The wound is located on the Sacrum. The wound measures 0.3cm length x 0.2cm width x 1cm depth; 0.047cm^2 area and 0.047cm^3 volume. There is Fat Layer (Subcutaneous Tissue) exposed. There is no tunneling noted, however, there is undermining starting at 9:00 and ending at 12:00 with a maximum distance of 1.1cm. There is a medium amount of serosanguineous drainage noted. The wound margin is distinct with the outline attached to the wound base. There is large (67-100%) red granulation within the wound bed. There is no necrotic tissue within the wound bed. The periwound skin appearance did not exhibit: Callus, Crepitus, Excoriation, Induration, Rash, Scarring, Dry/Scaly, Maceration, Atrophie Blanche, Cyanosis, Ecchymosis, Hemosiderin Staining, Mottled, Pallor, Rubor, Erythema. 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 [name] III PA-C Room 7 [phone] 09/04/2022 Nurse Visit: - Twice a week Mondays 0745 and Fridays 1130 Other: - will see you three times a week with dressing changes till you have your surgery with Dr. Roxan Hockey then he will take over wound care. Can cancel wound care appts when  surgery is planned. Anesthetic: (In clinic) Topical Lidocaine 4% applied to wound bed Negative Presssure  Wound Therapy: Discontinue wound vac. Call the number on the machine for the company to pick up. Off-Loading: Turn and reposition every 2 hours - when sitting. walk hourly to aid in offloading pressure to buttock. Other: - PCP at [facility] talked with pateint regarding possible Hyperbaric Oxygen Therapy. (08/07/22) WOUND #1: - Sacrum Wound Laterality: Cleanser: Normal Saline 3 x Per Week/30 Days Discharge Instructions: Cleanse the wound with Normal Saline prior to applying a clean dressing using gauze sponges, not tissue or cotton balls. Cleanser: Vashe 5.8 (oz) 3 x Per Week/30 Days Discharge Instructions: Cleanse the wound with Vashe prior to applying a clean dressing using gauze sponges, not tissue or cotton balls. Peri-Wound Care: Skin Prep 3 x Per Week/30 Days Discharge Instructions: Use skin prep as directed Prim Dressing: Endoform 2x2 in 3 x Per Week/30 Days ary Discharge Instructions: Moisten with saline Secondary Dressing: Woven Gauze Sponge, Non-Sterile 4x4 in 3 x Per Week/30 Days Discharge Instructions: Apply over primary dressing as directed. Secondary Dressing: Zetuvit Plus Silicone Border Dressing 7x7(in/in) 3 x Per Week/30 Days Discharge Instructions: Apply silicone border over primary dressing as directed. 1. I would recommend currently that we have the patient continue to monitor for any signs of infection or worsening. Based on what I am seeing I do believe that we should continue with the endoform along with the Zetuvit border foam dressing. I think this is doing about as good as anything for the time being. [name], [name] ([id number]) [id number] Page 8 of 8 2. I am going to recommend we continue to monitor closely for any signs of infection. Obviously this is the primary concern that I have right now ensuring that nothing becomes infected. We will see patient back for reevaluation in 1 week here in the clinic. If anything worsens or changes  patient will contact our office for additional recommendations. We will continue to see her weekly until she gets an for the surgery. Electronic Signature(s) Signed: 09/03/2022 8:20:40 AM By: Marissa Derry PA-C Entered By: [name] on 09/03/2022 08:20:39 -------------------------------------------------------------------------------- SuperBill Details Patient Name: Date of Service: [name], Marissa Sailors Cardenas. 08/28/2022 Medical Record Number: [id number] Patient Account Number: [id number] Date of Birth/Sex: Treating RN: 1950-06-23 (72 y.o. [name], [name] Primary Care Provider: Jayme Cloud., Molly Cardenas Other Clinician: Referring Provider: Treating Provider/Extender: [name] in Treatment: 34 Diagnosis Coding ICD-10 Codes Code Description [phone] Pressure ulcer of sacral region, stage 4 M86.48 Chronic osteomyelitis with draining sinus, other site M62.81 Muscle weakness (generalized) E43 Unspecified severe protein-calorie malnutrition Facility Procedures : CPT4 Code: [id number] Description: 99213 - WOUND CARE VISIT-LEV 3 EST PT Modifier: Quantity: 1 Physician Procedures : CPT4 Code Description Modifier [id number] 99213 - WC PHYS LEVEL 3 - EST PT ICD-10 Diagnosis Description L89.154 Pressure ulcer of sacral region, stage 4 M86.48 Chronic osteomyelitis with draining sinus, other site M62.81 Muscle weakness (generalized) E43  Unspecified severe protein-calorie malnutrition Quantity: 1 Electronic Signature(s) Signed: 09/03/2022 8:20:52 AM By: Marissa Derry PA-C Previous Signature: 08/28/2022 3:27:24 PM Version By: Shawn Stall RN, BSN Previous Signature: 08/29/2022 5:52:49 PM Version By: Marissa Derry PA-C Entered By: [name] on 09/03/2022 08:20:51

## 2022-08-30 NOTE — Progress Notes (Signed)
DORISANN, SCHWANKE Cardenas ([id number]) [id number] Page 1 of 8 Visit Report for 08/28/2022 Arrival Information Details Patient Name: Date of Service: [name]. 08/28/2022 11:00 A M Medical Record Number: [id number] Patient Account Number: [id number] Date of Birth/Sex: Treating RN: [birth year] (72 y.o. [name], [name] Primary Care Natallie Ravenscroft: Jayme Cloud., [name] Other Clinician: Referring Dace Denn: Treating Yena Tisby/Extender: Crista Curb in Treatment: 34 Visit Information History Since Last Visit Added or deleted any medications: No Patient Arrived: Ambulatory Any new allergies or adverse reactions: No Arrival Time: 11:44 Had a fall or experienced change in No Accompanied By: Ty-Caregiver activities of daily living that may affect Transfer Assistance: None risk of falls: Patient Identification Verified: Yes Signs or symptoms of abuse/neglect since last visito No Secondary Verification Process Completed: Yes Hospitalized since last visit: No Patient Requires Transmission-Based Precautions: No Implantable device outside of the clinic excluding No Patient Has Alerts: No cellular tissue based products placed in the center since last visit: Has Dressing in Place as Prescribed: Yes Pain Present Now: No Notes Ty patient caregiver noted has called several times to the [facility]'s office with Dr. Roxan Hockey. per Ty waiting on authorization from insurance, Dr. Roxan Hockey to set a surgery date. Maddon Horton made aware. Electronic Signature(s) Signed: 08/28/2022 3:27:24 PM By: Shawn Stall RN, BSN Entered By: Shawn Stall on 08/28/2022 11:48:06 -------------------------------------------------------------------------------- Clinic Level of Care Assessment Details Patient Name: Date of Service: [name]. 08/28/2022 11:00 A M Medical Record Number: [id number] Patient Account Number: [id number] Date of Birth/Sex: Treating  RN: 03-21-1950 (72 y.o. [name], [name] Primary Care Karilyn Wind: Jayme Cloud., [name] Other Clinician: Referring Deisi Salonga: Treating Omarri Eich/Extender: Crista Curb in Treatment: 34 Clinic Level of Care Assessment Items TOOL 4 Quantity Score X- 1 0 Use when only an EandM is performed on FOLLOW-UP visit ASSESSMENTS - Nursing Assessment / Reassessment X- 1 10 Reassessment of Marissa-morbidities (includes updates in patient status) X- 1 5 Reassessment of Adherence to Treatment Plan ASSESSMENTS - Wound and Skin A ssessment / Reassessment X - Simple Wound Assessment / Reassessment - one wound 1 5 []  - 0 Complex Wound Assessment / Reassessment - multiple wounds X- 1 10 Dermatologic / Skin Assessment (not related to wound area) ASSESSMENTS - Focused Assessment [name], [name] ([id number]) [id number] Page 2 of 8 []  - 0 Circumferential Edema Measurements - multi extremities []  - 0 Nutritional Assessment / Counseling / Intervention []  - 0 Lower Extremity Assessment (monofilament, tuning fork, pulses) []  - 0 Peripheral Arterial Disease Assessment (using hand held doppler) ASSESSMENTS - Ostomy and/or Continence Assessment and Care []  - 0 Incontinence Assessment and Management []  - 0 Ostomy Care Assessment and Management (repouching, etc.) PROCESS - Coordination of Care X - Simple Patient / Family Education for ongoing care 1 15 []  - 0 Complex (extensive) Patient / Family Education for ongoing care X- 1 10 Staff obtains Chiropractor, Records, T Results / Process Orders est []  - 0 Staff telephones HHA, Nursing Homes / Clarify orders / etc []  - 0 Routine Transfer to another Facility (non-emergent condition) []  - 0 Routine Hospital Admission (non-emergent condition) []  - 0 New Admissions / Manufacturing engineer / Ordering NPWT Apligraf, etc. , []  - 0 Emergency Hospital Admission (emergent condition) X- 1 10 Simple Discharge  Coordination []  - 0 Complex (extensive) Discharge Coordination PROCESS - Special Needs []  - 0 Pediatric / Minor Patient Management []  - 0 Isolation Patient Management []  - 0 Hearing / Language /  Visual special needs []  - 0 Assessment of Community assistance (transportation, D/C planning, etc.) []  - 0 Additional assistance / Altered mentation []  - 0 Support Surface(s) Assessment (bed, cushion, seat, etc.) INTERVENTIONS - Wound Cleansing / Measurement X - Simple Wound Cleansing - one wound 1 5 []  - 0 Complex Wound Cleansing - multiple wounds X- 1 5 Wound Imaging (photographs - any number of wounds) []  - 0 Wound Tracing (instead of photographs) X- 1 5 Simple Wound Measurement - one wound []  - 0 Complex Wound Measurement - multiple wounds INTERVENTIONS - Wound Dressings X - Small Wound Dressing one or multiple wounds 1 10 []  - 0 Medium Wound Dressing one or multiple wounds []  - 0 Large Wound Dressing one or multiple wounds []  - 0 Application of Medications - topical []  - 0 Application of Medications - injection INTERVENTIONS - Miscellaneous []  - 0 External ear exam []  - 0 Specimen Collection (cultures, biopsies, blood, body fluids, etc.) []  - 0 Specimen(s) / Culture(s) sent or taken to Lab for analysis []  - 0 Patient Transfer (multiple staff / Michiel Sites Lift / Similar devices) []  - 0 Simple Staple / Suture removal (25 or less) [name], [name] ([id number]) [id number] Page 3 of 8 []  - 0 Complex Staple / Suture removal (26 or more) []  - 0 Hypo / Hyperglycemic Management (close monitor of Blood Glucose) []  - 0 Ankle / Brachial Index (ABI) - do not check if billed separately X- 1 5 Vital Signs Has the patient been seen at the hospital within the last three years: Yes Total Score: 95 Level Of Care: New/Established - Level 3 Electronic Signature(s) Signed: 08/28/2022 3:27:24 PM By: Shawn Stall RN, BSN Entered By: Shawn Stall on 08/28/2022  11:50:55 -------------------------------------------------------------------------------- Encounter Discharge Information Details Patient Name: Date of Service: [name]. 08/28/2022 11:00 A M Medical Record Number: [id number] Patient Account Number: [id number] Date of Birth/Sex: Treating RN: 12/25/1950 (71 y.o. [name], [name] Primary Care Leianne Callins: Jayme Cloud., [name] Other Clinician: Referring Nusrat Encarnacion: Treating Cashay Manganelli/Extender: Crista Curb in Treatment: 34 Encounter Discharge Information Items Discharge Condition: Stable Ambulatory Status: Ambulatory Discharge Destination: Home Transportation: Private Auto Accompanied By: caregiver Schedule Follow-up Appointment: Yes Clinical Summary of Care: Electronic Signature(s) Signed: 08/28/2022 3:27:24 PM By: Shawn Stall RN, BSN Entered By: Shawn Stall on 08/28/2022 11:51:47 -------------------------------------------------------------------------------- Lower Extremity Assessment Details Patient Name: Date of Service: Marissa [name]. 08/28/2022 11:00 A M Medical Record Number: [id number] Patient Account Number: [id number] Date of Birth/Sex: Treating RN: 07-18-1950 (72 y.o. [name], [name] Primary Care Charlet Harr: Jayme Cloud., [name] Other Clinician: Referring Kaedyn Polivka: Treating Makani Seckman/Extender: Crista Curb in Treatment: 34 Electronic Signature(s) Signed: 08/28/2022 3:27:24 PM By: Shawn Stall RN, BSN Entered By: Shawn Stall on 08/28/2022 11:48:27 [name] ([id number]) [id number] Page 4 of 8 -------------------------------------------------------------------------------- Multi-Disciplinary Care Plan Details Patient Name: Date of Service: [name]. 08/28/2022 11:00 A M Medical Record Number: [id number] Patient Account Number: [id number] Date of Birth/Sex: Treating RN: 01/19/1951 (72 y.o. [name], [name] Primary  Care Franshesca Chipman: Jayme Cloud., [name] Other Clinician: Referring Gerrick Ray: Treating Timea Breed/Extender: Crista Curb in Treatment: 34 Active Inactive Osteomyelitis Nursing Diagnoses: Infection: osteomyelitis Knowledge deficit related to disease process and management Goals: Diagnostic evaluation for osteomyelitis completed as ordered Date Initiated: 07/03/2022 Target Resolution Date: 10/09/2022 Goal Status: Active Patient/caregiver will verbalize understanding of disease process and disease management Date Initiated: 07/03/2022 Target Resolution Date: 10/09/2022  Goal Status: Active Interventions: Assess for signs and symptoms of osteomyelitis resolution every visit Provide education on osteomyelitis Screen for HBO Treatment Activities: Consult for HBO : 07/03/2022 MRI : 06/26/2022 Systemic antibiotics : 07/03/2022 White Blood Cell Scan : 07/03/2022 Notes: Pain, Acute or Chronic Nursing Diagnoses: Pain, acute or chronic: actual or potential Potential alteration in comfort, pain Goals: Patient will verbalize adequate pain control and receive pain control interventions during procedures as needed Date Initiated: 01/02/2022 Target Resolution Date: 10/09/2022 Goal Status: Active Patient/caregiver will verbalize adequate pain control between visits Date Initiated: 01/02/2022 Date Inactivated: 07/17/2022 Target Resolution Date: 07/13/2022 Goal Status: Met Interventions: Encourage patient to take pain medications as prescribed Provide education on pain management Reposition patient for comfort Treatment Activities: Administer pain control measures as ordered : 01/02/2022 Notes: Electronic Signature(s) Signed: 08/28/2022 3:27:24 PM By: Shawn Stall RN, BSN Entered By: Shawn Stall on 08/28/2022 11:25:49 [name] ([id number]) [id number] Page 5 of  8 -------------------------------------------------------------------------------- Pain Assessment Details Patient Name: Date of Service: [name]. 08/28/2022 11:00 A M Medical Record Number: [id number] Patient Account Number: [id number] Date of Birth/Sex: Treating RN: 1950/03/21 (72 y.o. [name], [name] Primary Care Parrie Rasco: Jayme Cloud., [name] Other Clinician: Referring Leonela Kivi: Treating Maicey Barrientez/Extender: Crista Curb in Treatment: 34 Active Problems Location of Pain Severity and Description of Pain Patient Has Paino No Site Locations Pain Management and Medication Current Pain Management: Electronic Signature(s) Signed: 08/28/2022 3:27:24 PM By: Shawn Stall RN, BSN Entered By: Shawn Stall on 08/28/2022 11:48:23 -------------------------------------------------------------------------------- Patient/Caregiver Education Details Patient Name: Date of Service: [name] 6/19/2024andnbsp11:00 A M Medical Record Number: [id number] Patient Account Number: [id number] Date of Birth/Gender: Treating RN: Jul 01, 1950 (72 y.o. [name], [name] Primary Care Physician: Jayme Cloud., [name] Other Clinician: Referring Physician: Treating Physician/Extender: Crista Curb in Treatment: 34 Education Assessment Education Provided To: Patient Education Topics Provided Wound/Skin Impairment: Handouts: Caring for Your Ulcer Methods: Explain/Verbal Responses: Reinforcements needed Electronic Signature(s) Signed: 08/28/2022 3:27:24 PM By: Shawn Stall RN, BSN [place], Karel Jarvis ([id number]) [id number] Page 6 of 8 Entered By: Shawn Stall on 08/28/2022 11:26:00 -------------------------------------------------------------------------------- Wound Assessment Details Patient Name: Date of Service: [name]. 08/28/2022 11:00 A M Medical Record Number: [id number] Patient Account  Number: [id number] Date of Birth/Sex: Treating RN: 31-Jul-1950 (72 y.o. [name], [name] Primary Care Nour Rodrigues: Jayme Cloud., [name] Other Clinician: Referring William Laske: Treating Rupert Azzara/Extender: Crista Curb in Treatment: 34 Wound Status Wound Number: 1 Primary Etiology: Pressure Ulcer Wound Location: Sacrum Wound Status: Open Wounding Event: Pressure Injury Comorbid History: Colitis Date Acquired: 10/09/2021 Weeks Of Treatment: 34 Clustered Wound: No Photos Wound Measurements Length: (cm) 0.3 Width: (cm) 0.2 Depth: (cm) 1 Area: (cm) 0.047 Volume: (cm) 0.047 % Reduction in Area: 86.7% % Reduction in Volume: 81% Epithelialization: None Tunneling: No Undermining: Yes Starting Position (o'clock): 9 Ending Position (o'clock): 12 Maximum Distance: (cm) 1.1 Wound Description Classification: Category/Stage IV Wound Margin: Distinct, outline attached Exudate Amount: Medium Exudate Type: Serosanguineous Exudate Color: red, brown Foul Odor After Cleansing: No Slough/Fibrino No Wound Bed Granulation Amount: Large (67-100%) Exposed Structure Granulation Quality: Red Fascia Exposed: No Necrotic Amount: None Present (0%) Fat Layer (Subcutaneous Tissue) Exposed: Yes Tendon Exposed: No Muscle Exposed: No Joint Exposed: No Bone Exposed: No Periwound Skin Texture Texture Color No Abnormalities Noted: No No Abnormalities Noted: No Callus: No Atrophie Blanche: No Crepitus: No Cyanosis: No Excoriation: No Ecchymosis: No Carboni, [name] ([id number]) [id number] Page 7 of 8 Induration: No Erythema: No Rash: No Hemosiderin Staining: No Scarring: No Mottled: No Pallor: No Moisture Rubor: No No Abnormalities Noted: No Dry / Scaly: No Maceration: No Treatment Notes Wound #1 (Sacrum) Cleanser Normal Saline Discharge Instruction: Cleanse the wound with Normal Saline prior to applying a clean dressing using gauze  sponges, not tissue or cotton balls. Vashe 5.8 (oz) Discharge Instruction: Cleanse the wound with Vashe prior to applying a clean dressing using gauze sponges, not tissue or cotton balls. Peri-Wound Care Skin Prep Discharge Instruction: Use skin prep as directed Topical Primary Dressing Endoform 2x2 in Discharge Instruction: Moisten with saline Secondary Dressing Woven Gauze Sponge, Non-Sterile 4x4 in Discharge Instruction: Apply over primary dressing as directed. Zetuvit Plus Silicone Border Dressing 7x7(in/in) Discharge Instruction: Apply silicone border over primary dressing as directed. Secured With Compression Wrap Compression Stockings Facilities manager) Signed: 08/28/2022 3:27:24 PM By: Shawn Stall RN, BSN Signed: 08/30/2022 2:31:37 PM By: Thayer Dallas Entered By: Thayer Dallas on 08/28/2022 11:53:52 -------------------------------------------------------------------------------- Vitals Details Patient Name: Date of Service: [name]. 08/28/2022 11:00 A M Medical Record Number: [id number] Patient Account Number: [id number] Date of Birth/Sex: Treating RN: [birth year] (72 y.o. [name], [name] Primary Care Ziyonna Christner: Jayme Cloud., [name] Other Clinician: Referring Isaak Delmundo: Treating Bryam Taborda/Extender: Crista Curb in Treatment: 34 Vital Signs Time Taken: 11:48 Temperature (F): 97.8 Height (in): 64 Pulse (bpm): 59 Weight (lbs): 97 Respiratory Rate (breaths/min): 16 Body Mass Index (BMI): 16.6 Blood Pressure (mmHg): 116/55 Reference Range: 80 - 120 mg / dl Electronic Signature(s) Signed: 08/28/2022 3:27:24 PM By: Shawn Stall RN, BSN [name], [name] ([id number]) By: Shawn Stall RN, BSN [id number] Page 8 of 8 Signed: 08/28/2022 3:27:24 PM Entered By: Shawn Stall on 08/28/2022 11:48:44

## 2022-08-31 NOTE — Progress Notes (Signed)
Marissa, [name] ([id number]) [id number] Page 1 of 5 Visit Report for 08/30/2022 Arrival Information Details Patient Name: Date of Service: [name] 08/30/2022 11:30 A M Medical Record Number: [id number] Patient Account Number: [id number] Date of Birth/Sex: Treating RN: Jun 06, 1950 (72 y.o. F) Primary Care [name]: [name] Other Clinician: Referring [name]: Treating Isamar Wellbrock/Extender: Crissie Figures in Treatment: 34 Visit Information History Since Last Visit Added or deleted any medications: No Patient Arrived: Ambulatory Any new allergies or adverse reactions: No Arrival Time: 12:05 Had a fall or experienced change in No Accompanied By: self activities of daily living that may affect Transfer Assistance: None risk of falls: Patient Identification Verified: Yes Signs or symptoms of abuse/neglect since last visito No Secondary Verification Process Completed: Yes Hospitalized since last visit: No Patient Requires Transmission-Based Precautions: No Implantable device outside of the clinic excluding No Patient Has Alerts: No cellular tissue based products placed in the center since last visit: Has Dressing in Place as Prescribed: Yes Pain Present Now: No Electronic Signature(s) Signed: 08/30/2022 2:31:37 PM By: Thayer Dallas Entered By: Thayer Dallas on 08/30/2022 12:07:20 -------------------------------------------------------------------------------- Clinic Level of Care Assessment Details Patient Name: Date of Service: [name] 08/30/2022 11:30 A M Medical Record Number: [id number] Patient Account Number: [id number] Date of Birth/Sex: Treating RN: 1950/09/10 (72 y.o. [name], [name] Primary Care Zaryan Yakubov: [name] Other Clinician: Referring Ra Pfiester: Treating Dhyana Bastone/Extender: Crissie Figures in Treatment: 34 Clinic Level of Care Assessment  Items TOOL 4 Quantity Score X- 1 0 Use when only an EandM is performed on FOLLOW-UP visit ASSESSMENTS - Nursing Assessment / Reassessment X- 1 10 Reassessment of Co-morbidities (includes updates in patient status) X- 1 5 Reassessment of Adherence to Treatment Plan ASSESSMENTS - Wound and Skin A ssessment / Reassessment X - Simple Wound Assessment / Reassessment - one wound 1 5 []  - 0 Complex Wound Assessment / Reassessment - multiple wounds []  - 0 Dermatologic / Skin Assessment (not related to wound area) ASSESSMENTS - Focused Assessment []  - 0 Circumferential Edema Measurements - multi extremities []  - 0 Nutritional Assessment / Counseling / Intervention [name], [name] ([id number]) [phone] Page 2 of 5 []  - 0 Lower Extremity Assessment (monofilament, tuning fork, pulses) []  - 0 Peripheral Arterial Disease Assessment (using hand held doppler) ASSESSMENTS - Ostomy and/or Continence Assessment and Care []  - 0 Incontinence Assessment and Management []  - 0 Ostomy Care Assessment and Management (repouching, etc.) PROCESS - Coordination of Care X - Simple Patient / Family Education for ongoing care 1 15 []  - 0 Complex (extensive) Patient / Family Education for ongoing care X- 1 10 Staff obtains Chiropractor, Records, T Results / Process Orders est []  - 0 Staff telephones HHA, Nursing Homes / Clarify orders / etc []  - 0 Routine Transfer to another Facility (non-emergent condition) []  - 0 Routine Hospital Admission (non-emergent condition) []  - 0 New Admissions / Manufacturing engineer / Ordering NPWT Apligraf, etc. , []  - 0 Emergency Hospital Admission (emergent condition) X- 1 10 Simple Discharge Coordination []  - 0 Complex (extensive) Discharge Coordination PROCESS - Special Needs []  - 0 Pediatric / Minor Patient Management []  - 0 Isolation Patient Management []  - 0 Hearing / Language / Visual special needs []  - 0 Assessment of  Community assistance (transportation, D/C planning, etc.) []  - 0 Additional assistance / Altered mentation []  - 0 Support Surface(s) Assessment (bed, cushion, seat, etc.) INTERVENTIONS - Wound Cleansing / Measurement X -  Simple Wound Cleansing - one wound 1 5 []  - 0 Complex Wound Cleansing - multiple wounds []  - 0 Wound Imaging (photographs - any number of wounds) []  - 0 Wound Tracing (instead of photographs) []  - 0 Simple Wound Measurement - one wound []  - 0 Complex Wound Measurement - multiple wounds INTERVENTIONS - Wound Dressings X - Small Wound Dressing one or multiple wounds 1 10 []  - 0 Medium Wound Dressing one or multiple wounds []  - 0 Large Wound Dressing one or multiple wounds []  - 0 Application of Medications - topical []  - 0 Application of Medications - injection INTERVENTIONS - Miscellaneous []  - 0 External ear exam []  - 0 Specimen Collection (cultures, biopsies, blood, body fluids, etc.) []  - 0 Specimen(s) / Culture(s) sent or taken to Lab for analysis []  - 0 Patient Transfer (multiple staff / Nurse, adult / Similar devices) []  - 0 Simple Staple / Suture removal (25 or less) []  - 0 Complex Staple / Suture removal (26 or more) []  - 0 Hypo / Hyperglycemic Management (close monitor of Blood Glucose) [name], [name] ([id number]) [id number] Page 3 of 5 []  - 0 Ankle / Brachial Index (ABI) - do not check if billed separately X- 1 5 Vital Signs Has the patient been seen at the hospital within the last three years: Yes Total Score: 75 Level Of Care: New/Established - Level 2 Electronic Signature(s) Signed: 08/31/2022 4:22:51 PM By: Shawn Stall RN, BSN Entered By: Shawn Stall on 08/30/2022 13:25:13 -------------------------------------------------------------------------------- Encounter Discharge Information Details Patient Name: Date of Service: [name]. 08/30/2022 11:30 A M Medical Record Number:  [id number] Patient Account Number: [id number] Date of Birth/Sex: Treating RN: [birth year] (72 y.o. [name], [name] Primary Care Bentli Llorente: [name] Other Clinician: Referring Mikolaj Woolstenhulme: Treating Kincaid Tiger/Extender: Crissie Figures in Treatment: 34 Encounter Discharge Information Items Discharge Condition: Stable Ambulatory Status: Ambulatory Discharge Destination: Home Transportation: Private Auto Accompanied By: self Schedule Follow-up Appointment: Yes Clinical Summary of Care: Electronic Signature(s) Signed: 08/31/2022 4:22:51 PM By: Shawn Stall RN, BSN Entered By: Shawn Stall on 08/30/2022 13:24:48 -------------------------------------------------------------------------------- Wound Assessment Details Patient Name: Date of Service: [place] Deliah Goody Cardenas. 08/30/2022 11:30 A M Medical Record Number: [id number] Patient Account Number: [id number] Date of Birth/Sex: Treating RN: 1950-12-23 (72 y.o. F) Primary Care Elijha Dedman: [name] Other Clinician: Referring Kasia Trego: Treating Robinette Esters/Extender: Crissie Figures in Treatment: 34 Wound Status Wound Number: 1 Primary Etiology: Pressure Ulcer Wound Location: Sacrum Wound Status: Open Wounding Event: Pressure Injury Date Acquired: 10/09/2021 Weeks Of Treatment: 34 Clustered Wound: No Wound Measurements Length: (cm) 0.3 Width: (cm) 0.2 Depth: (cm) 1 Area: (cm) 0.047 Volume: (cm) 0.047 % Reduction in Area: 86.7% % Reduction in Volume: 81% Wound Description RIANNE, DEGRAAF Cardenas ([id number]) Classification: Category/Stage IV Exudate Amount: Medium Exudate Type: Serosanguineous Exudate Color: red, brown [id number] Page 4 of 5 Periwound Skin Texture Texture Color No Abnormalities Noted: No No Abnormalities Noted: No Moisture No Abnormalities Noted: No Treatment Notes Wound #1 (Sacrum) Cleanser Normal Saline Discharge  Instruction: Cleanse the wound with Normal Saline prior to applying a clean dressing using gauze sponges, not tissue or cotton balls. Vashe 5.8 (oz) Discharge Instruction: Cleanse the wound with Vashe prior to applying a clean dressing using gauze sponges, not tissue or cotton balls. Peri-Wound Care Skin Prep Discharge Instruction: Use skin prep as directed Topical Primary Dressing Endoform 2x2 in Discharge Instruction: Moisten with saline Secondary Dressing Woven Gauze Sponge, Non-Sterile 4x4 in  Discharge Instruction: Apply over primary dressing as directed. Zetuvit Plus Silicone Border Dressing 7x7(in/in) Discharge Instruction: Apply silicone border over primary dressing as directed. Secured With Compression Wrap Compression Stockings Facilities manager) Signed: 08/30/2022 2:31:37 PM By: Thayer Dallas Entered By: Thayer Dallas on 08/30/2022 12:07:36 -------------------------------------------------------------------------------- Vitals Details Patient Name: Date of Service: [name]. 08/30/2022 11:30 A M Medical Record Number: [id number] Patient Account Number: [id number] Date of Birth/Sex: Treating RN: Sep 14, 1950 (72 y.o. F) Primary Care Najiyah Paris: [name] Other Clinician: Referring Devansh Riese: Treating Zade Falkner/Extender: Crissie Figures in Treatment: 34 Vital Signs Time Taken: 12:07 Reference Range: 80 - 120 mg / dl Height (in): 64 Weight (lbs): 97 Body Mass Index (BMI): 16.6 Nifong, Josee Cardenas ([id number]) [id number] Page 5 of 5 Electronic Signature(s) Signed: 08/30/2022 2:31:37 PM By: Thayer Dallas Entered By: Thayer Dallas on 08/30/2022 12:07:26

## 2022-09-02 ENCOUNTER — Encounter (HOSPITAL_BASED_OUTPATIENT_CLINIC_OR_DEPARTMENT_OTHER): Payer: 59 | Admitting: Internal Medicine

## 2022-09-02 DIAGNOSIS — M8648 Chronic osteomyelitis with draining sinus, other site: Secondary | ICD-10-CM | POA: Diagnosis not present

## 2022-09-02 DIAGNOSIS — L89153 Pressure ulcer of sacral region, stage 3: Secondary | ICD-10-CM | POA: Diagnosis not present

## 2022-09-02 DIAGNOSIS — E43 Unspecified severe protein-calorie malnutrition: Secondary | ICD-10-CM | POA: Diagnosis not present

## 2022-09-02 DIAGNOSIS — M6281 Muscle weakness (generalized): Secondary | ICD-10-CM | POA: Diagnosis not present

## 2022-09-02 DIAGNOSIS — Z681 Body mass index (BMI) 19 or less, adult: Secondary | ICD-10-CM | POA: Diagnosis not present

## 2022-09-02 DIAGNOSIS — L89154 Pressure ulcer of sacral region, stage 4: Secondary | ICD-10-CM | POA: Diagnosis not present

## 2022-09-02 NOTE — Progress Notes (Signed)
LALANYA, RUFENER R ([id number]) [id number] Page 1 of 1 Visit Report for 08/30/2022 SuperBill Details Patient Name: Date of Service: [name] 08/30/2022 Medical Record Number: [id number] Patient Account Number: [id number] Date of Birth/Sex: Treating RN: 03-Jul-1950 (73 y.o. [name], [name] Primary Care Provider: Jayme Cloud., [name] Other Clinician: Referring Provider: Treating Provider/Extender: Crissie Figures in Treatment: 34 Diagnosis Coding ICD-10 Codes Code Description [phone] Pressure ulcer of sacral region, stage 4 M86.48 Chronic osteomyelitis with draining sinus, other site M62.81 Muscle weakness (generalized) E43 Unspecified severe protein-calorie malnutrition Facility Procedures CPT4 Code Description Modifier Quantity [id number] [phone] - WOUND CARE VISIT-LEV 2 EST PT 1 Electronic Signature(s) Signed: 08/31/2022 4:22:51 PM By: Shawn Stall RN, BSN Signed: 09/02/2022 4:29:34 PM By: Geralyn Corwin DO Entered By: Shawn Stall on 08/30/2022 13:25:17

## 2022-09-03 DIAGNOSIS — H0520 Unspecified exophthalmos: Secondary | ICD-10-CM | POA: Diagnosis not present

## 2022-09-03 NOTE — Progress Notes (Signed)
LOLLY, GLAUS R ([id number]) [id number] Page 1 of 1 Visit Report for 09/02/2022 SuperBill Details Patient Name: Date of Service: [name] 09/02/2022 Medical Record Number: [id number] Patient Account Number: [id number] Date of Birth/Sex: Treating RN: 1951-01-28 (72 y.o. [name] Primary Care Provider: Jayme Cloud., [name] Other Clinician: Referring Provider: Treating Provider/Extender: Allena Katz., [name] in Treatment: 34 Diagnosis Coding ICD-10 Codes Code Description [phone] Pressure ulcer of sacral region, stage 4 M86.48 Chronic osteomyelitis with draining sinus, other site M62.81 Muscle weakness (generalized) E43 Unspecified severe protein-calorie malnutrition Facility Procedures CPT4 Code Description Modifier Quantity [id number] [phone] - WOUND CARE VISIT-LEV 3 EST PT 25 1 Electronic Signature(s) Signed: 09/02/2022 4:29:34 PM By: Geralyn Corwin DO Signed: 09/03/2022 4:52:02 PM By: Brenton Grills Entered By: Brenton Grills on 09/02/2022 08:01:26

## 2022-09-03 NOTE — Progress Notes (Signed)
FAJR, FIFE R ([id number]) [id number] Page 1 of 3 Visit Report for 09/02/2022 Arrival Information Details Patient Name: Date of Service: [name] 09/02/2022 7:45 A M Medical Record Number: [id number] Patient Account Number: [id number] Date of Birth/Sex: Treating RN: 02/03/1951 (72 y.o. [name] Primary Care Emarion Toral: Jayme Cloud., [name] Other Clinician: Referring Tineshia Becraft: Treating Othman Masur/Extender: Allena Katz., [name] in Treatment: 34 Visit Information History Since Last Visit All ordered tests and consults were completed: Yes Patient Arrived: Ambulatory Added or deleted any medications: No Arrival Time: 07:52 Any new allergies or adverse reactions: No Accompanied By: self Had a fall or experienced change in No Transfer Assistance: None activities of daily living that may affect Patient Identification Verified: Yes risk of falls: Secondary Verification Process Completed: Yes Signs or symptoms of abuse/neglect since last visito No Patient Requires Transmission-Based Precautions: No Hospitalized since last visit: No Patient Has Alerts: No Implantable device outside of the clinic excluding No cellular tissue based products placed in the center since last visit: Has Dressing in Place as Prescribed: Yes Pain Present Now: No Electronic Signature(s) Signed: 09/03/2022 4:52:02 PM By: Brenton Grills Entered By: Brenton Grills on 09/02/2022 07:52:41 -------------------------------------------------------------------------------- Encounter Discharge Information Details Patient Name: Date of Service: Marissa Berthold R. 09/02/2022 7:45 A M Medical Record Number: [id number] Patient Account Number: [id number] Date of Birth/Sex: Treating RN: 12-Feb-1951 (72 y.o. [name] Primary Care Ismelda Weatherman: Jayme Cloud., [name] Other Clinician: Referring Ngoc Detjen: Treating Merridy Pascoe/Extender: Allena Katz.,  [name] in Treatment: 34 Encounter Discharge Information Items Discharge Condition: Stable Ambulatory Status: Ambulatory Discharge Destination: Skilled Nursing Facility Telephoned: No Orders Sent: Yes Transportation: Private Auto Accompanied By: self Schedule Follow-up Appointment: Yes Clinical Summary of Care: Patient Declined Electronic Signature(s) Signed: 09/03/2022 4:52:02 PM By: Brenton Grills Entered By: Brenton Grills on 09/02/2022 08:01:12 [name] ([id number]) [id number] Page 2 of 3 -------------------------------------------------------------------------------- Patient/Caregiver Education Details Patient Name: Date of Service: [name] 6/24/2024andnbsp7:45 A M Medical Record Number: [id number] Patient Account Number: [id number] Date of Birth/Gender: Treating RN: Jul 31, 1950 (72 y.o. [name] Primary Care Physician: Jayme Cloud., [name] Other Clinician: Referring Physician: Treating Physician/Extender: Allena Katz., [name] in Treatment: 34 Education Assessment Education Provided To: Patient Education Topics Provided Wound/Skin Impairment: Methods: Explain/Verbal Responses: State content correctly Electronic Signature(s) Signed: 09/03/2022 4:52:02 PM By: Brenton Grills Entered By: Brenton Grills on 09/02/2022 08:00:49 -------------------------------------------------------------------------------- Wound Assessment Details Patient Name: Date of Service: Marissa Marissa Goody R. 09/02/2022 7:45 A M Medical Record Number: [id number] Patient Account Number: [id number] Date of Birth/Sex: Treating RN: 09-04-1950 (72 y.o. [name] Primary Care Shaqueta Casady: Jayme Cloud., [name] Other Clinician: Referring Trenell Moxey: Treating Bekah Igoe/Extender: Allena Katz., [name] in Treatment: 34 Wound Status Wound Number: 1 Primary Etiology: Pressure Ulcer Wound Location: Sacrum Wound  Status: Open Wounding Event: Pressure Injury Comorbid History: Colitis Date Acquired: 10/09/2021 Weeks Of Treatment: 34 Clustered Wound: No Wound Measurements Length: (cm) 0.3 Width: (cm) 0.2 Depth: (cm) 1 Area: (cm) 0.047 Volume: (cm) 0.047 % Reduction in Area: 86.7% % Reduction in Volume: 81% Tunneling: No Undermining: No Wound Description Classification: Category/Stage IV Exudate Amount: Medium Exudate Type: Serosanguineous Exudate Color: red, brown Periwound Skin Texture Texture Color No Abnormalities Noted: No No Abnormalities Noted: No Moisture YVETTE, LOVELESS R ([id number]) [id number] Page 3 of 3 No Abnormalities Noted: No Treatment Notes Wound #1 (Sacrum) Cleanser Normal Saline Discharge Instruction: Cleanse the wound with Normal Saline prior to applying a  clean dressing using gauze sponges, not tissue or cotton balls. Vashe 5.8 (oz) Discharge Instruction: Cleanse the wound with Vashe prior to applying a clean dressing using gauze sponges, not tissue or cotton balls. Peri-Wound Care Skin Prep Discharge Instruction: Use skin prep as directed Topical Primary Dressing Endoform 2x2 in Discharge Instruction: Moisten with saline Secondary Dressing Woven Gauze Sponge, Non-Sterile 4x4 in Discharge Instruction: Apply over primary dressing as directed. Zetuvit Plus Silicone Border Dressing 7x7(in/in) Discharge Instruction: Apply silicone border over primary dressing as directed. Secured With Compression Wrap Compression Stockings Add-Ons Electronic Signature(s) Signed: 09/03/2022 4:52:02 PM By: Brenton Grills Entered By: Brenton Grills on 09/02/2022 07:55:25 -------------------------------------------------------------------------------- Vitals Details Patient Name: Date of Service: Marissa Berthold R. 09/02/2022 7:45 A M Medical Record Number: [id number] Patient Account Number: [id number] Date of Birth/Sex: Treating RN: [birth year] (72  y.o. [name] Primary Care Deng Kemler: Jayme Cloud., [name] Other Clinician: Referring Ridhima Golberg: Treating Tocarra Gassen/Extender: Allena Katz., [name] in Treatment: 34 Vital Signs Time Taken: 07:52 Temperature (F): 98.2 Height (in): 64 Pulse (bpm): 78 Weight (lbs): 97 Respiratory Rate (breaths/min): 18 Body Mass Index (BMI): 16.6 Blood Pressure (mmHg): 130/64 Reference Range: 80 - 120 mg / dl Electronic Signature(s) Signed: 09/03/2022 4:52:02 PM By: Brenton Grills Entered By: Brenton Grills on 09/02/2022 07:53:06

## 2022-09-04 ENCOUNTER — Encounter (HOSPITAL_BASED_OUTPATIENT_CLINIC_OR_DEPARTMENT_OTHER): Payer: 59 | Admitting: Physician Assistant

## 2022-09-04 DIAGNOSIS — Z681 Body mass index (BMI) 19 or less, adult: Secondary | ICD-10-CM | POA: Diagnosis not present

## 2022-09-04 DIAGNOSIS — H5789 Other specified disorders of eye and adnexa: Secondary | ICD-10-CM | POA: Diagnosis not present

## 2022-09-04 DIAGNOSIS — L89153 Pressure ulcer of sacral region, stage 3: Secondary | ICD-10-CM | POA: Diagnosis not present

## 2022-09-04 DIAGNOSIS — M6281 Muscle weakness (generalized): Secondary | ICD-10-CM | POA: Diagnosis not present

## 2022-09-04 DIAGNOSIS — L89154 Pressure ulcer of sacral region, stage 4: Secondary | ICD-10-CM | POA: Diagnosis not present

## 2022-09-04 DIAGNOSIS — E43 Unspecified severe protein-calorie malnutrition: Secondary | ICD-10-CM | POA: Diagnosis not present

## 2022-09-04 DIAGNOSIS — M8648 Chronic osteomyelitis with draining sinus, other site: Secondary | ICD-10-CM | POA: Diagnosis not present

## 2022-09-04 NOTE — Progress Notes (Addendum)
RAYA, MCKINSTRY Cardenas ([id number]) [id number] Page 1 of 8 Visit Report for 09/04/2022 Chief Complaint Document Details Patient Name: Date of Service: [name] 09/04/2022 8:45 A M Medical Record Number: [id number] Patient Account Number: [id number] Date of Birth/Sex: Treating RN: 29-Dec-1950 (72 y.o. F) Primary Care Provider: Jayme Cloud., [name] Other Clinician: Referring Provider: Treating Provider/Extender: Crista Curb in Treatment: 35 Information Obtained from: Patient Chief Complaint Pressure ulcer stage 3 Sacrum Electronic Signature(s) Signed: 09/04/2022 8:02:52 AM By: Allen Derry PA-C Entered By: Allen Derry on 09/04/2022 08:02:51 -------------------------------------------------------------------------------- HPI Details Patient Name: Date of Service: Marissa [name]. 09/04/2022 8:45 A M Medical Record Number: [id number] Patient Account Number: [id number] Date of Birth/Sex: Treating RN: [birth year] (72 y.o. F) Primary Care Provider: Jayme Cloud., [name] Other Clinician: Referring Provider: Treating Provider/Extender: Crista Curb in Treatment: 35 History of Present Illness HPI Description: 01-02-22 upon evaluation today patient presents for initial inspection here in our clinic concerning a wound that she has over the sacral region. This is stated to be present since around the beginning of August 2023. She currently resides in a assisted nursing facility. She does seem to be able to answer questions fully today. Subsequently I do note that she is a little on the thin side but again other than the protein calorie malnutrition she is minimally weak but still does get up and move around some but she also tells me that she "sits a lot". She has not had any x-rays of the sacral region at this point. 01-09-2022 upon evaluation today patient's wound in the sacral area actually appears to be doing  decently well. Fortunately I do not see any signs of infection which is great news and overall I am extremely pleased with where things stand today. 11/8; this is a patient who lives in some form of small assisted living in [place]. She has a deep wound over the lower part of her coccyx. An x-ray that we ordered from last time did not show any osseous abnormalities. We are supposed to be using Hydrofera Blue in the wound but I am really not sure what they are using to dress this and who is doing it. Talking to our staff they have apparently discussed this with the staff in the facility. She does not have home health. 01-23-2022 upon evaluation today patient appears to be doing decently well in regard to her wound. She has been tolerating the dressing changes without complication although I am not certain the dressing changes have been done appropriately over the past couple of weeks. We have been trying to get her to come in here we also try to get her into a wound care center in [place] which would be closer for the assisted living facility. Unfortunately neither 1 of those were undertaken up to this point. The patient did end up having some training of the staff from an RN that they brought him to have the staff perform the dressing changes but that being said it does not sound like this has been done correctly. Nonetheless I do believe that based on what we see it would benefit the patient to actually have her come here 3 times a week also think a wound VAC could potentially be a possibility for her which would help to get things moving in a much better direction as far as healing is concerned. We need to get this to fill-in though it looks clean and  does not look infected I do think that we need to really get this to fill-in and there is quite a bit of undermining that is to be considered here. 01-30-2022 upon evaluation today patient appears to be doing well currently in regard to her wound which  is looking pretty decent but still has quite a bit of space underneath as far as undermining is concerned. I think she might possibly be better with a wound VAC. With that being said if when I do this we probably only be able to do it 2 times a week at most. I discussed that with the patient today she is okay with that we just need to see if we can get the insurance approval and then of course the scheduling underway. 02-13-2022 upon evaluation today patient appears to be doing well currently in regard to her wound. She actually tells me that it is feeling a lot better which is good news. Fortunately I do not see any signs of active infection at this time. No fevers, chills, nausea, vomiting, or diarrhea. 02-27-2022 upon evaluation today patient appears to be doing well currently in regard to her wound. She is actually showing some signs of improvement we are [name], [name] ([id number]) [id number] Page 2 of 8 still obtaining the approval for the snap VAC which I think could be beneficial in the meantime we been using the Hydrofera Blue rope. 03-27-2022 upon evaluation today patient appears to be doing okay in regard to her wound but there was no dressing in place upon arrival today. Fortunately I see again no evidence of infection. No fevers, chills, nausea, vomiting, or diarrhea. 04-03-2022 upon evaluation today patient did not have a dressing on when she came into the office. With that being said she actually tells me that it was on until yesterday she took a shower and it came loose and then today came off completely. With that being said I really do not think she needs to be taking shower every single day or having to see her here in the clinic 3 times per week on Monday, Wednesday, and Friday and during those times obviously in between she needs to probably avoid showering. For that reason what I advised her to do would be to shower in the mornings on the days that she  comes to see [place] that way if the dressing does come off or start to come off we will be changing it anyway. She voiced understanding and tells me she can do that. Unfortunately the facility has nobody to help her with changing the dressings and we are not currently having to do that here in the clinic again. This means that during the time that she was not coming in from December 20 through when I saw her last week on the 17th there was a period of 2 weeks where the wound really was not changed at all as the RN that Baird Lyons tell me they had at the facility did not end up working out. This obviously is not good and is not what we expect to see as far as patient care is concerned which is why we are now seeing her 3 times a week here in the clinic. 04-10-2022 upon evaluation today patient appears to be doing well currently in regard to her wound which is actually showing signs of good granulation epithelization at this point. However she does have quite a bit of drainage and we are little concerned about the possibility of infection. For  that reason I think she could benefit from a PCR culture followed by [facility] topical antibiotics. I am also thinking of going ahead and doing gentamicin today as well. 2/7; small wound on the lower sacrum however with considerable degree of undermining from roughly 7-4. PCR culture that was done last week showed "no organisms". We have been using gentamicin and iodoform packing.She lives in some form of group home in [place] I believe. Uncertain about the adequacy of offloading this area 04-24-2022 upon evaluation patient's wound actually is showing signs of doing decently well I do believe she would benefit from a snap VAC and we discussed that again today. Fortunately I do not see any signs of active infection locally nor systemically which is great news. No fevers, chills, nausea, vomiting, or diarrhea. 05-01-2022 upon evaluation today patient's wound is really doing  about the same. Fortunately I do not see any signs of active infection locally nor systemically at this time which is great news with that being said we have gotten approval for the snap VAC and we will going to go ahead and place that today. 05-08-2022 upon evaluation today patient appears to be doing well currently in regard to her wound all things considered but were having a difficult time with a snap VAC suctioning appropriately. With that being said we will get a go ahead and likely avoid the snap VAC at this point at least until we can get the actual brace dressing as bridging ourselves does not seem to be working nearly as well. 3/6; I note the difficulty with a snap VAC which is disappointing but apparently another type of VAC is being considered. Also a referral to plastic surgery at [facility]. The patient has a very small but deep and at the base circumferential undermining. She is coming in here now 3 times a week for [facility] placement 3/13; patient presents for follow-up. She has been using Hydrofera Blue 3 times a week. She comes into our clinic for nurse visits to have this done. We are still awaiting a wound VAC. She has no issues or complaints today. 05-29-2022 upon evaluation today patient appears to be doing about the same in regard to her wound. She still has significant undermining although the opening is a huge the undermining is significant. I do believe that a gauze VAC would be beneficial here. The patient does not eat normally and in fact tells me that she eats very well. She also is doing supplements with Juven at this point. She also has a good ability to get up and move around she is completely ambulatory and does not just lay with pressure on this area. She also is able to roll and reposition in bed without complication. For that reason she really has no need for a group 2 mattress at this point whatsoever this would be a complete waste of resources as far as insurance  and medical equipment is concerned. As far as her urinary incontinence she has no issues with incontinence at this point she is able to carry herself to the bathroom when needed she does wear a depends just in case but again that is not a routinely utilized item. 06-05-2022 upon evaluation today patient appears to be doing decently well in regard to her wound I do not see any signs of infection overall she seems to be doing quite well. I do think that she is making good progress in general here. We do have the wound VAC however and I think  this is going to potentially help to get this. Hand has been the biggest issue we have had is getting this to actually feeling more effectively. 06-12-2022 upon evaluation today patient appears to be doing okay in regard to her wound. We are still seeing some signs of improvement. Fortunately there does not appear to be any evidence of active infection at this time which is good news she did see the doctors over at [facility] yesterday and they are ordering an MRI in preparation for considering possibility of a surgical closure. The patient voiced understanding she had forgotten we have made this referral. T be o honest I was back in February kind of forgot about the referral to in the interim trying to proceed with her wound care. Nonetheless I appreciate their be evaluation of this and again we will see what the MRI shows. 06-19-2022 upon evaluation today patient's wound is actually showing signs of good improvement and very pleased with where we stand today. Fortunately I do not see any evidence of infection locally nor systemically which is great news I think the wound VAC is doing a good job of undermining seems less than last week. 06-26-2022 upon evaluation today patient appears to be doing well with regard to her wound although her wound VAC it actually died she had forgotten to charge it overnight. With that being said she does seem to be doing decently well at  this point based on what I am seeing I think that the undermining is much less than what it was previous. Fortunately I do not see any signs of active infection locally nor systemically at this time. 07-03-2022 upon evaluation today patient appears to be doing decently well in regard to her wound. She actually did have an MRI performed and I did review that today as well. It shows that she actually does have a focal marrow signal abnormality in the left aspect of the sacrum at the S4 level with adjacent soft tissue swelling and presumed sacral decubitus ulcer concerning for a small area of osteomyelitis. Otherwise there did not appear to be any fluid collection which is good news there is no signs of an obvious abscess. 07-10-2022 upon evaluation today patient appears to be doing well currently in regard to her wound. She has been tolerating the dressing changes without complication. Fortunately there does not appear to be any signs of active infection which is good news. 07-17-2022 upon evaluation today patient unfortunately does not seem to be making progress with the gauze VAC. We have given her a good trial with this and unfortunately I do not think that we are seeing any improvement for that reason I think it would be best to send this back and not continue therapy with the gauze back at this point. The patient voiced understanding and she is in agreement with that plan. She does have an appointment with the plastic surgeon at [facility] it was supposed to be yesterday but apparently got moved next Tuesday, May 14. 07-31-2022 upon evaluation today patient appears to be doing well currently in regard to her wound all things considered. She does have osteomyelitis of the sacral area which is noted. With that being said I think that the [facility] Blue rope has done decently well but I do believe that she may benefit from switching to endoform and just packed this very well with endoform. As it starts to  dissolve it would not be able to be over packed. She is in agreement with giving  this a trial. 08-07-2022 upon evaluation today patient appears to be doing really about the same in regard to her wound. The endoform I think may be doing a little bit better than the [facility] but were still not seeing a lot of progress here. This is unfortunate as again we are trying to get this healed as quickly as possible. Nonetheless I do believe that she would benefit from a likely antibiotic course as well as potentially hyperbaric oxygen therapy. With that being said I discussed with the patient at this point that we need to likely see about getting the labs done as it does not look like they were ever done back in April at the end when I ordered this. They were supposed to be done but we never received any results. Nonetheless at this point I think we would need updated results anyway in order to determine where we would need to go currently. She voiced understanding as did the individual that was with her today. KETINA, MARS Cardenas ([id number]) [id number] Page 3 of 8 08-14-2022 upon evaluation today patient appears to be doing well currently in regard to her wound. This is any better but is also not any worse. We have been doing a good job keeping it under control. With that being said she did see Dr. Roxan Hockey at [facility] and they are actually proceed with a flap surgery. Again this I think is probably to be the primary best way to get this closed and she is in agreement with that plan she is actually very happy about the plan. 08-21-2022 upon evaluation patient appears to be doing about the same as compared to last week's evaluation. There is really no significant improvement overall in size or depth. She is still awaiting the appointment for the flap surgery. I did talk to the facility today and they are going to reach out to [facility] to try to figure out what the plan is as far as  getting her scheduled. 08-28-2022 upon evaluation today patient appears to be doing about the same in regard to her wound. The good news is she is not any worse the bad news is she is also not really significantly better but they are still waiting on the scheduling for the surgery at [facility]. That we will happen at some point right now it is in the insurance approval phase apparently based on what I am hearing. 09-04-2022 upon evaluation today patient presents for follow-up concerning her ongoing issues with the sacral wound. Was still waiting on the surgery to be scheduled in [facility] for the flap surgery. With that being said I do not see any evidence of active infection locally or systemically at this time. No fevers, chills, nausea, vomiting, or diarrhea. Electronic Signature(s) Signed: 09/04/2022 8:54:04 AM By: Allen Derry PA-C Entered By: Allen Derry on 09/04/2022 08:54:04 -------------------------------------------------------------------------------- Physical Exam Details Patient Name: Date of Service: Marissa [name]. 09/04/2022 8:45 A M Medical Record Number: [id number] Patient Account Number: [id number] Date of Birth/Sex: Treating RN: 02-06-1951 (72 y.o. F) Primary Care Provider: Jayme Cloud., [name] Other Clinician: Referring Provider: Treating Provider/Extender: Crista Curb in Treatment: 35 Constitutional Well-nourished and well-hydrated in no acute distress. Respiratory normal breathing without difficulty. Psychiatric this patient is able to make decisions and demonstrates good insight into disease process. Alert and Oriented x 3. pleasant and cooperative. Notes Upon inspection patient's wound bed actually showed signs of good granulation epithelization at this point. Fortunately I do  not see any signs of worsening wound and also do not see any signs of significant improvement this is basically maintaining again this will try to do is maintain  things until she gets in for the surgery at which point this should be able to be completely resolved. Electronic Signature(s) Signed: 09/04/2022 8:54:29 AM By: Allen Derry PA-C Entered By: Allen Derry on 09/04/2022 08:54:29 -------------------------------------------------------------------------------- Physician Orders Details Patient Name: Date of Service: Marissa [name]. 09/04/2022 8:45 A M Medical Record Number: [id number] Patient Account Number: [id number] Date of Birth/Sex: Treating RN: 1950/09/23 (72 y.o. [name] Primary Care Provider: Jayme Cloud., [name] Other Clinician: Referring Provider: Treating Provider/Extender: Crista Curb in Treatment: [phone] Verbal / Phone Orders: No Diagnosis Coding ICD-10 [address] MAKARI, SANKO ([id number]) [id number] Page 4 of 8 Code Description L89.154 Pressure ulcer of sacral region, stage 4 M86.48 Chronic osteomyelitis with draining sinus, other site M62.81 Muscle weakness (generalized) E43 Unspecified severe protein-calorie malnutrition Follow-up Appointments ppointment in 1 week. Allen Derry III PA-C Room 7 Return A Nurse Visit: - Twice a week Mondays 0745 and Fridays 1130 Other: - will see you three times a week with dressing changes till you have your surgery with Dr. Roxan Hockey then he will take over wound care. Can cancel wound care appts when surgery is planned. Anesthetic (In clinic) Topical Lidocaine 4% applied to wound bed Negative Presssure Wound Therapy Discontinue wound vac. Call the number on the machine for the company to pick up. Off-Loading Turn and reposition every 2 hours - when sitting. walk hourly to aid in offloading pressure to buttock. Other: - PCP at Wound Clinic talked with pateint regarding possible Hyperbaric Oxygen Therapy. (08/07/22) Wound Treatment Wound #1 - Sacrum Cleanser: Normal Saline 3 x Per Week/30 Days Discharge Instructions: Cleanse  the wound with Normal Saline prior to applying a clean dressing using gauze sponges, not tissue or cotton balls. Cleanser: Vashe 5.8 (oz) 3 x Per Week/30 Days Discharge Instructions: Cleanse the wound with Vashe prior to applying a clean dressing using gauze sponges, not tissue or cotton balls. Peri-Wound Care: Skin Prep 3 x Per Week/30 Days Discharge Instructions: Use skin prep as directed Prim Dressing: Endoform 2x2 in 3 x Per Week/30 Days ary Discharge Instructions: Moisten with saline Secondary Dressing: Woven Gauze Sponge, Non-Sterile 4x4 in 3 x Per Week/30 Days Discharge Instructions: Apply over primary dressing as directed. Secondary Dressing: Zetuvit Plus Silicone Border Dressing 7x7(in/in) 3 x Per Week/30 Days Discharge Instructions: Apply silicone border over primary dressing as directed. Electronic Signature(s) Signed: 09/04/2022 4:15:09 PM By: Allen Derry PA-C Signed: 09/04/2022 4:45:25 PM By: Karie Schwalbe RN Entered By: Karie Schwalbe on 09/04/2022 08:48:44 -------------------------------------------------------------------------------- Problem List Details Patient Name: Date of Service: Marissa [name]. 09/04/2022 8:45 A M Medical Record Number: [id number] Patient Account Number: [id number] Date of Birth/Sex: Treating RN: 1950-09-25 (72 y.o. F) Primary Care Provider: Jayme Cloud., [name] Other Clinician: Referring Provider: Treating Provider/Extender: Crista Curb in Treatment: 35 Active Problems ICD-10 Encounter Code Description Active Date MDM Diagnosis L89.154 Pressure ulcer of sacral region, stage 4 01/02/2022 No Yes SOLASH, TULLO Cardenas ([id number]) [phone].pdf Page 5 of 8 M86.48 Chronic osteomyelitis with draining sinus, other site 07/31/2022 No Yes M62.81 Muscle weakness (generalized) 01/02/2022 No Yes E43 Unspecified severe protein-calorie malnutrition 01/02/2022 No Yes Inactive Problems Resolved  Problems Electronic Signature(s) Signed: 09/04/2022 8:02:46 AM By: Allen Derry PA-C Entered By: Allen Derry on 09/04/2022 08:02:46 -------------------------------------------------------------------------------- Progress  Note Details Patient Name: Date of Service: [name] 09/04/2022 8:45 A M Medical Record Number: [id number] Patient Account Number: [id number] Date of Birth/Sex: Treating RN: 1951-01-30 (72 y.o. F) Primary Care Provider: Jayme Cloud., [name] Other Clinician: Referring Provider: Treating Provider/Extender: Crista Curb in Treatment: 35 Subjective Chief Complaint Information obtained from Patient Pressure ulcer stage 3 Sacrum History of Present Illness (HPI) 01-02-22 upon evaluation today patient presents for initial inspection here in our clinic concerning a wound that she has over the sacral region. This is stated to be present since around the beginning of August 2023. She currently resides in a assisted nursing facility. She does seem to be able to answer questions fully today. Subsequently I do note that she is a little on the thin side but again other than the protein calorie malnutrition she is minimally weak but still does get up and move around some but she also tells me that she "sits a lot". She has not had any x-rays of the sacral region at this point. 01-09-2022 upon evaluation today patient's wound in the sacral area actually appears to be doing decently well. Fortunately I do not see any signs of infection which is great news and overall I am extremely pleased with where things stand today. 11/8; this is a patient who lives in some form of small assisted living in [place]. She has a deep wound over the lower part of her coccyx. An x-ray that we ordered from last time did not show any osseous abnormalities. We are supposed to be using Hydrofera Blue in the wound but I am really not sure what they are using to dress this and  who is doing it. Talking to our staff they have apparently discussed this with the staff in the facility. She does not have home health. 01-23-2022 upon evaluation today patient appears to be doing decently well in regard to her wound. She has been tolerating the dressing changes without complication although I am not certain the dressing changes have been done appropriately over the past couple of weeks. We have been trying to get her to come in here we also try to get her into a wound care center in [place] which would be closer for the assisted living facility. Unfortunately neither 1 of those were undertaken up to this point. The patient did end up having some training of the staff from an RN that they brought him to have the staff perform the dressing changes but that being said it does not sound like this has been done correctly. Nonetheless I do believe that based on what we see it would benefit the patient to actually have her come here 3 times a week also think a wound VAC could potentially be a possibility for her which would help to get things moving in a much better direction as far as healing is concerned. We need to get this to fill-in though it looks clean and does not look infected I do think that we need to really get this to fill-in and there is quite a bit of undermining that is to be considered here. 01-30-2022 upon evaluation today patient appears to be doing well currently in regard to her wound which is looking pretty decent but still has quite a bit of space underneath as far as undermining is concerned. I think she might possibly be better with a wound VAC. With that being said if when I do this we probably  only be able to do it 2 times a week at most. I discussed that with the patient today she is okay with that we just need to see if we can get the insurance approval and then of course the scheduling underway. 02-13-2022 upon evaluation today patient appears to be doing well  currently in regard to her wound. She actually tells me that it is feeling a lot better which is good news. Fortunately I do not see any signs of active infection at this time. No fevers, chills, nausea, vomiting, or diarrhea. 02-27-2022 upon evaluation today patient appears to be doing well currently in regard to her wound. She is actually showing some signs of improvement we are still obtaining the approval for the snap VAC which I think could be beneficial in the meantime we been using the Hydrofera Blue rope. 03-27-2022 upon evaluation today patient appears to be doing okay in regard to her wound but there was no dressing in place upon arrival today. Fortunately I see again no evidence of infection. No fevers, chills, nausea, vomiting, or diarrhea. [name], [name] ([id number]) [id number] Page 6 of 8 04-03-2022 upon evaluation today patient did not have a dressing on when she came into the office. With that being said she actually tells me that it was on until yesterday she took a shower and it came loose and then today came off completely. With that being said I really do not think she needs to be taking shower every single day or having to see her here in the clinic 3 times per week on Monday, Wednesday, and Friday and during those times obviously in between she needs to probably avoid showering. For that reason what I advised her to do would be to shower in the mornings on the days that she comes to see [place] that way if the dressing does come off or start to come off we will be changing it anyway. She voiced understanding and tells me she can do that. Unfortunately the facility has nobody to help her with changing the dressings and we are not currently having to do that here in the clinic again. This means that during the time that she was not coming in from December 20 through when I saw her last week on the 17th there was a period of 2 weeks where the wound really was  not changed at all as the RN that Baird Lyons tell me they had at the facility did not end up working out. This obviously is not good and is not what we expect to see as far as patient care is concerned which is why we are now seeing her 3 times a week here in the clinic. 04-10-2022 upon evaluation today patient appears to be doing well currently in regard to her wound which is actually showing signs of good granulation epithelization at this point. However she does have quite a bit of drainage and we are little concerned about the possibility of infection. For that reason I think she could benefit from a PCR culture followed by [facility] topical antibiotics. I am also thinking of going ahead and doing gentamicin today as well. 2/7; small wound on the lower sacrum however with considerable degree of undermining from roughly 7-4. PCR culture that was done last week showed "no organisms". We have been using gentamicin and iodoform packing.She lives in some form of group home in [place] I believe. Uncertain about the adequacy of offloading this area 04-24-2022 upon evaluation patient's wound actually  is showing signs of doing decently well I do believe she would benefit from a snap VAC and we discussed that again today. Fortunately I do not see any signs of active infection locally nor systemically which is great news. No fevers, chills, nausea, vomiting, or diarrhea. 05-01-2022 upon evaluation today patient's wound is really doing about the same. Fortunately I do not see any signs of active infection locally nor systemically at this time which is great news with that being said we have gotten approval for the snap VAC and we will going to go ahead and place that today. 05-08-2022 upon evaluation today patient appears to be doing well currently in regard to her wound all things considered but were having a difficult time with a snap VAC suctioning appropriately. With that being said we will get a go ahead and  likely avoid the snap VAC at this point at least until we can get the actual brace dressing as bridging ourselves does not seem to be working nearly as well. 3/6; I note the difficulty with a snap VAC which is disappointing but apparently another type of VAC is being considered. Also a referral to plastic surgery at [facility]. The patient has a very small but deep and at the base circumferential undermining. She is coming in here now 3 times a week for [facility] placement 3/13; patient presents for follow-up. She has been using Hydrofera Blue 3 times a week. She comes into our clinic for nurse visits to have this done. We are still awaiting a wound VAC. She has no issues or complaints today. 05-29-2022 upon evaluation today patient appears to be doing about the same in regard to her wound. She still has significant undermining although the opening is a huge the undermining is significant. I do believe that a gauze VAC would be beneficial here. The patient does not eat normally and in fact tells me that she eats very well. She also is doing supplements with Juven at this point. She also has a good ability to get up and move around she is completely ambulatory and does not just lay with pressure on this area. She also is able to roll and reposition in bed without complication. For that reason she really has no need for a group 2 mattress at this point whatsoever this would be a complete waste of resources as far as insurance and medical equipment is concerned. As far as her urinary incontinence she has no issues with incontinence at this point she is able to carry herself to the bathroom when needed she does wear a depends just in case but again that is not a routinely utilized item. 06-05-2022 upon evaluation today patient appears to be doing decently well in regard to her wound I do not see any signs of infection overall she seems to be doing quite well. I do think that she is making good progress in  general here. We do have the wound VAC however and I think this is going to potentially help to get this. Hand has been the biggest issue we have had is getting this to actually feeling more effectively. 06-12-2022 upon evaluation today patient appears to be doing okay in regard to her wound. We are still seeing some signs of improvement. Fortunately there does not appear to be any evidence of active infection at this time which is good news she did see the doctors over at [facility] yesterday and they are ordering an MRI in preparation for considering possibility  of a surgical closure. The patient voiced understanding she had forgotten we have made this referral. T be o honest I was back in February kind of forgot about the referral to in the interim trying to proceed with her wound care. Nonetheless I appreciate their be evaluation of this and again we will see what the MRI shows. 06-19-2022 upon evaluation today patient's wound is actually showing signs of good improvement and very pleased with where we stand today. Fortunately I do not see any evidence of infection locally nor systemically which is great news I think the wound VAC is doing a good job of undermining seems less than last week. 06-26-2022 upon evaluation today patient appears to be doing well with regard to her wound although her wound VAC it actually died she had forgotten to charge it overnight. With that being said she does seem to be doing decently well at this point based on what I am seeing I think that the undermining is much less than what it was previous. Fortunately I do not see any signs of active infection locally nor systemically at this time. 07-03-2022 upon evaluation today patient appears to be doing decently well in regard to her wound. She actually did have an MRI performed and I did review that today as well. It shows that she actually does have a focal marrow signal abnormality in the left aspect of the sacrum at the S4  level with adjacent soft tissue swelling and presumed sacral decubitus ulcer concerning for a small area of osteomyelitis. Otherwise there did not appear to be any fluid collection which is good news there is no signs of an obvious abscess. 07-10-2022 upon evaluation today patient appears to be doing well currently in regard to her wound. She has been tolerating the dressing changes without complication. Fortunately there does not appear to be any signs of active infection which is good news. 07-17-2022 upon evaluation today patient unfortunately does not seem to be making progress with the gauze VAC. We have given her a good trial with this and unfortunately I do not think that we are seeing any improvement for that reason I think it would be best to send this back and not continue therapy with the gauze back at this point. The patient voiced understanding and she is in agreement with that plan. She does have an appointment with the plastic surgeon at [facility] it was supposed to be yesterday but apparently got moved next Tuesday, May 14. 07-31-2022 upon evaluation today patient appears to be doing well currently in regard to her wound all things considered. She does have osteomyelitis of the sacral area which is noted. With that being said I think that the [facility] Blue rope has done decently well but I do believe that she may benefit from switching to endoform and just packed this very well with endoform. As it starts to dissolve it would not be able to be over packed. She is in agreement with giving this a trial. 08-07-2022 upon evaluation today patient appears to be doing really about the same in regard to her wound. The endoform I think may be doing a little bit better than the [facility] but were still not seeing a lot of progress here. This is unfortunate as again we are trying to get this healed as quickly as possible. Nonetheless I do believe that she would benefit from a likely antibiotic  course as well as potentially hyperbaric oxygen therapy. With that being said I discussed  with the patient at this point that we need to likely see about getting the labs done as it does not look like they were ever done back in April at the end when I ordered this. They were supposed to be done but we never received any results. Nonetheless at this point I think we would need updated results anyway in order to determine where we would need to go currently. She voiced understanding as did the individual that was with her today. 08-14-2022 upon evaluation today patient appears to be doing well currently in regard to her wound. This is any better but is also not any worse. We have been doing a good job keeping it under control. With that being said she did see Dr. Roxan Hockey at [facility] and they are actually proceed with a flap surgery. Again this I think is probably to be the primary best way to get this closed and she is in agreement with that plan she is actually very happy about the plan. 08-21-2022 upon evaluation patient appears to be doing about the same as compared to last week's evaluation. There is really no significant improvement [name], [name] ([id number]) [id number] Page 7 of 8 overall in size or depth. She is still awaiting the appointment for the flap surgery. I did talk to the facility today and they are going to reach out to [facility] to try to figure out what the plan is as far as getting her scheduled. 08-28-2022 upon evaluation today patient appears to be doing about the same in regard to her wound. The good news is she is not any worse the bad news is she is also not really significantly better but they are still waiting on the scheduling for the surgery at [facility]. That we will happen at some point right now it is in the insurance approval phase apparently based on what I am hearing. 09-04-2022 upon evaluation today patient presents for follow-up concerning  her ongoing issues with the sacral wound. Was still waiting on the surgery to be scheduled in [facility] for the flap surgery. With that being said I do not see any evidence of active infection locally or systemically at this time. No fevers, chills, nausea, vomiting, or diarrhea. Objective Constitutional Well-nourished and well-hydrated in no acute distress. Vitals Time Taken: 8:04 AM, Height: 64 in, Weight: 97 lbs, BMI: 16.6, Temperature: 97.6 F, Pulse: 68 bpm, Respiratory Rate: 20 breaths/min, Blood Pressure: 130/68 mmHg. Respiratory normal breathing without difficulty. Psychiatric this patient is able to make decisions and demonstrates good insight into disease process. Alert and Oriented x 3. pleasant and cooperative. General Notes: Upon inspection patient's wound bed actually showed signs of good granulation epithelization at this point. Fortunately I do not see any signs of worsening wound and also do not see any signs of significant improvement this is basically maintaining again this will try to do is maintain things until she gets in for the surgery at which point this should be able to be completely resolved. Integumentary (Hair, Skin) Wound #1 status is Open. Original cause of wound was Pressure Injury. The date acquired was: 10/09/2021. The wound has been in treatment 35 weeks. The wound is located on the Sacrum. The wound measures 0.3cm length x 0.2cm width x 1cm depth; 0.047cm^2 area and 0.047cm^3 volume. There is Fat Layer (Subcutaneous Tissue) exposed. There is no tunneling noted, however, there is undermining starting at 12:00 and ending at 12:00 with a maximum distance of 1.8cm. There is a medium amount  of serosanguineous drainage noted. There is large (67-100%) red granulation within the wound bed. There is a small (1-33%) amount of necrotic tissue within the wound bed including Adherent Slough. The periwound skin appearance had no abnormalities noted for moisture.  The periwound skin appearance had no abnormalities noted for color. Periwound temperature was noted as No Abnormality. 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 Allen Derry III PA-C Room 7 Nurse Visit: - Twice a week Mondays 0745 and Fridays 1130 Other: - will see you three times a week with dressing changes till you have your surgery with Dr. Roxan Hockey then he will take over wound care. Can cancel wound care appts when surgery is planned. Anesthetic: (In clinic) Topical Lidocaine 4% applied to wound bed Negative Presssure Wound Therapy: Discontinue wound vac. Call the number on the machine for the company to pick up. Off-Loading: Turn and reposition every 2 hours - when sitting. walk hourly to aid in offloading pressure to buttock. Other: - PCP at [facility] talked with pateint regarding possible Hyperbaric Oxygen Therapy. (08/07/22) WOUND #1: - Sacrum Wound Laterality: Cleanser: Normal Saline 3 x Per Week/30 Days Discharge Instructions: Cleanse the wound with Normal Saline prior to applying a clean dressing using gauze sponges, not tissue or cotton balls. Cleanser: Vashe 5.8 (oz) 3 x Per Week/30 Days Discharge Instructions: Cleanse the wound with Vashe prior to applying a clean dressing using gauze sponges, not tissue or cotton balls. Peri-Wound Care: Skin Prep 3 x Per Week/30 Days Discharge Instructions: Use skin prep as directed Prim Dressing: Endoform 2x2 in 3 x Per Week/30 Days ary Discharge Instructions: Moisten with saline [name], [name] ([id number]) [id number] Page 8 of 8 Secondary Dressing: Woven Gauze Sponge, Non-Sterile 4x4 in 3 x Per Week/30 Days Discharge Instructions: Apply over primary dressing as  directed. Secondary Dressing: Zetuvit Plus Silicone Border Dressing 7x7(in/in) 3 x Per Week/30 Days Discharge Instructions: Apply silicone border over primary dressing as directed. 1. Based on what I am seeing I do believe that the patient should continue to monitor for any signs of infection or worsening. Based on what I see currently I do believe that the patient is tolerating the dressing changes without complication and in general I do believe that we are moving in the right direction here. 2. I am going to recommend as well the patient should continue with the endoform along with the bordered foam dressing to cover. 3. I am also can recommend the patient should continue to monitor for any signs of infection or worsening. Based on what I am seeing I do believe that we are moving in the right direction here. We will see patient back for reevaluation in 1 week here in the clinic. If anything worsens or changes patient will contact our office for additional recommendations. Electronic Signature(s) Signed: 09/04/2022 8:55:11 AM By: Allen Derry PA-C Entered By: Allen Derry on 09/04/2022 08:55:11 -------------------------------------------------------------------------------- SuperBill Details Patient Name: Date of Service: Marissa [name]. 09/04/2022 Medical Record Number: [id number] Patient Account Number: [id number] Date of Birth/Sex: Treating RN: October 03, 1950 (72 y.o. F) Primary Care Provider: Jayme Cloud., [name] Other Clinician: Referring Provider: Treating Provider/Extender: Crista Curb in Treatment: 35 Diagnosis Coding ICD-10 Codes Code Description [id number] Pressure ulcer of sacral region, stage 4 M86.48 Chronic osteomyelitis with draining sinus, other site M62.81 Muscle weakness (  generalized) E43 Unspecified severe protein-calorie malnutrition Facility Procedures : CPT4 Code: [id number] Description: [phone] - WOUND CARE VISIT-LEV 2 EST  PT Modifier: Quantity: 1 Physician Procedures : CPT4 Code Description Modifier [id number] - WC PHYS LEVEL 3 - EST PT ICD-10 Diagnosis Description L89.154 Pressure ulcer of sacral region, stage 4 M86.48 Chronic osteomyelitis with draining sinus, other site M62.81 Muscle weakness (generalized) E43  Unspecified severe protein-calorie malnutrition Quantity: 1 Electronic Signature(s) Signed: 09/04/2022 3:46:49 PM By: Pearletha Alfred Signed: 09/04/2022 4:15:09 PM By: Allen Derry PA-C Previous Signature: 09/04/2022 8:55:29 AM Version By: Allen Derry PA-C Entered By: Pearletha Alfred on 09/04/2022 15:46:48

## 2022-09-06 ENCOUNTER — Encounter (HOSPITAL_BASED_OUTPATIENT_CLINIC_OR_DEPARTMENT_OTHER): Payer: 59 | Admitting: Internal Medicine

## 2022-09-06 DIAGNOSIS — M6281 Muscle weakness (generalized): Secondary | ICD-10-CM | POA: Diagnosis not present

## 2022-09-06 DIAGNOSIS — L89154 Pressure ulcer of sacral region, stage 4: Secondary | ICD-10-CM | POA: Diagnosis not present

## 2022-09-06 DIAGNOSIS — Z681 Body mass index (BMI) 19 or less, adult: Secondary | ICD-10-CM | POA: Diagnosis not present

## 2022-09-06 DIAGNOSIS — M8648 Chronic osteomyelitis with draining sinus, other site: Secondary | ICD-10-CM | POA: Diagnosis not present

## 2022-09-06 DIAGNOSIS — E43 Unspecified severe protein-calorie malnutrition: Secondary | ICD-10-CM | POA: Diagnosis not present

## 2022-09-06 DIAGNOSIS — L89153 Pressure ulcer of sacral region, stage 3: Secondary | ICD-10-CM | POA: Diagnosis not present

## 2022-09-06 NOTE — Progress Notes (Signed)
[name], [name] ([id number]) [id number] Page 1 of 1 Visit Report for 09/06/2022 SuperBill Details Patient Name: Date of Service: [name] 09/06/2022 Medical Record Number: [id number] Patient Account Number: [id number] Date of Birth/Sex: Treating RN: 1950-03-25 (72 y.o. F) Primary Care Provider: Jayme Cloud., [name] Other Clinician: Referring Provider: Treating Provider/Extender: Crissie Figures in Treatment: 35 Diagnosis Coding ICD-10 Codes Code Description [phone] Pressure ulcer of sacral region, stage 4 M86.48 Chronic osteomyelitis with draining sinus, other site M62.81 Muscle weakness (generalized) E43 Unspecified severe protein-calorie malnutrition Facility Procedures CPT4 Code Description Modifier Quantity [phone] - WOUND CARE VISIT-LEV 2 EST PT 1 Electronic Signature(s) Signed: 09/06/2022 11:18:15 AM By: Thayer Dallas Signed: 09/06/2022 11:46:38 AM By: Geralyn Corwin DO Entered By: Thayer Dallas on 09/06/2022 11:16:59

## 2022-09-06 NOTE — Progress Notes (Signed)
PREKSHA, TEMPEL Cardenas ([id number]) [id number] Page 1 of 5 Visit Report for 09/06/2022 Arrival Information Details Patient Name: Date of Service: [name] 09/06/2022 11:30 A M Medical Record Number: [id number] Patient Account Number: [id number] Date of Birth/Sex: Treating RN: 04-17-1950 (72 y.o. F) Primary Care Pricsilla Lindvall: Jayme Cloud., [name] Other Clinician: Referring Myquan Schaumburg: Treating Suliman Termini/Extender: Crissie Figures in Treatment: 35 Visit Information History Since Last Visit Added or deleted any medications: No Patient Arrived: Ambulatory Any new allergies or adverse reactions: No Arrival Time: 11:09 Had a fall or experienced change in No Accompanied By: self activities of daily living that may affect Transfer Assistance: None risk of falls: Patient Identification Verified: Yes Signs or symptoms of abuse/neglect since last visito No Secondary Verification Process Completed: Yes Hospitalized since last visit: No Patient Requires Transmission-Based Precautions: No Implantable device outside of the clinic excluding No Patient Has Alerts: No cellular tissue based products placed in the center since last visit: Has Dressing in Place as Prescribed: Yes Pain Present Now: No Electronic Signature(s) Signed: 09/06/2022 11:18:15 AM By: Thayer Dallas Entered By: Thayer Dallas on 09/06/2022 11:10:12 -------------------------------------------------------------------------------- Clinic Level of Care Assessment Details Patient Name: Date of Service: [name] 09/06/2022 11:30 A M Medical Record Number: [id number] Patient Account Number: [id number] Date of Birth/Sex: Treating RN: Jul 06, 1950 (72 y.o. F) Primary Care Ibraheem Voris: Jayme Cloud., Mort Sawyers Clinician: Thayer Dallas Referring Braden Deloach: Treating Vincient Vanaman/Extender: Allena Katz., [name] in Treatment: 35 Clinic Level of Care Assessment  Items TOOL 4 Quantity Score X- 1 0 Use when only an EandM is performed on FOLLOW-UP visit ASSESSMENTS - Nursing Assessment / Reassessment X- 1 10 Reassessment of Co-morbidities (includes updates in patient status) X- 1 5 Reassessment of Adherence to Treatment Plan ASSESSMENTS - Wound and Skin A ssessment / Reassessment X - Simple Wound Assessment / Reassessment - one wound 1 5 []  - 0 Complex Wound Assessment / Reassessment - multiple wounds []  - 0 Dermatologic / Skin Assessment (not related to wound area) ASSESSMENTS - Focused Assessment []  - 0 Circumferential Edema Measurements - multi extremities []  - 0 Nutritional Assessment / Counseling / Intervention IEASHA, [name] ([id number]) [phone].pdf Page 2 of 5 []  - 0 Lower Extremity Assessment (monofilament, tuning fork, pulses) []  - 0 Peripheral Arterial Disease Assessment (using hand held doppler) ASSESSMENTS - Ostomy and/or Continence Assessment and Care []  - 0 Incontinence Assessment and Management []  - 0 Ostomy Care Assessment and Management (repouching, etc.) PROCESS - Coordination of Care X - Simple Patient / Family Education for ongoing care 1 15 []  - 0 Complex (extensive) Patient / Family Education for ongoing care []  - 0 Staff obtains Chiropractor, Records, T Results / Process Orders est []  - 0 Staff telephones HHA, Nursing Homes / Clarify orders / etc []  - 0 Routine Transfer to another Facility (non-emergent condition) []  - 0 Routine Hospital Admission (non-emergent condition) []  - 0 New Admissions / Manufacturing engineer / Ordering NPWT Apligraf, etc. , []  - 0 Emergency Hospital Admission (emergent condition) X- 1 10 Simple Discharge Coordination []  - 0 Complex (extensive) Discharge Coordination PROCESS - Special Needs []  - 0 Pediatric / Minor Patient Management []  - 0 Isolation Patient Management []  - 0 Hearing / Language / Visual special needs []  - 0 Assessment of  Community assistance (transportation, D/C planning, etc.) []  - 0 Additional assistance / Altered mentation []  - 0 Support Surface(s) Assessment (bed, cushion, seat, etc.) INTERVENTIONS - Wound Cleansing / Measurement X -  Simple Wound Cleansing - one wound 1 5 []  - 0 Complex Wound Cleansing - multiple wounds []  - 0 Wound Imaging (photographs - any number of wounds) []  - 0 Wound Tracing (instead of photographs) []  - 0 Simple Wound Measurement - one wound []  - 0 Complex Wound Measurement - multiple wounds INTERVENTIONS - Wound Dressings X - Small Wound Dressing one or multiple wounds 1 10 []  - 0 Medium Wound Dressing one or multiple wounds []  - 0 Large Wound Dressing one or multiple wounds []  - 0 Application of Medications - topical []  - 0 Application of Medications - injection INTERVENTIONS - Miscellaneous []  - 0 External ear exam []  - 0 Specimen Collection (cultures, biopsies, blood, body fluids, etc.) []  - 0 Specimen(s) / Culture(s) sent or taken to Lab for analysis []  - 0 Patient Transfer (multiple staff / Nurse, adult / Similar devices) []  - 0 Simple Staple / Suture removal (25 or less) []  - 0 Complex Staple / Suture removal (26 or more) []  - 0 Hypo / Hyperglycemic Management (close monitor of Blood Glucose) [name], [name] ([id number]) [id number] Page 3 of 5 []  - 0 Ankle / Brachial Index (ABI) - do not check if billed separately []  - 0 Vital Signs Has the patient been seen at the hospital within the last three years: Yes Total Score: 60 Level Of Care: New/Established - Level 2 Electronic Signature(s) Signed: 09/06/2022 11:18:15 AM By: Thayer Dallas Entered By: Thayer Dallas on 09/06/2022 11:16:06 -------------------------------------------------------------------------------- Encounter Discharge Information Details Patient Name: Date of Service: Marissa Berthold Cardenas. 09/06/2022 11:30 A M Medical Record Number: [id number] Patient  Account Number: [id number] Date of Birth/Sex: Treating RN: Aug 24, 1950 (72 y.o. F) Primary Care Delaynee Alred: Jayme Cloud., Mort Sawyers Clinician: Thayer Dallas Referring Abdulkadir Emmanuel: Treating Sydnie Sigmund/Extender: Allena Katz., [name] in Treatment: 35 Encounter Discharge Information Items Discharge Condition: Stable Ambulatory Status: Ambulatory Discharge Destination: Home Transportation: Private Auto Accompanied By: self Schedule Follow-up Appointment: Yes Clinical Summary of Care: Electronic Signature(s) Signed: 09/06/2022 11:18:15 AM By: Thayer Dallas Entered By: Thayer Dallas on 09/06/2022 11:14:33 -------------------------------------------------------------------------------- Patient/Caregiver Education Details Patient Name: Date of Service: [name] 6/28/2024andnbsp11:30 A M Medical Record Number: [id number] Patient Account Number: [id number] Date of Birth/Gender: Treating RN: 1950-08-12 (72 y.o. F) Primary Care Physician: Jayme Cloud., [name] Other Clinician: Thayer Dallas Referring Physician: Treating Physician/Extender: Crissie Figures in Treatment: 35 Education Assessment Education Provided To: Patient Education Topics Provided Electronic Signature(s) Signed: 09/06/2022 11:18:15 AM By: Thayer Dallas Entered By: Thayer Dallas on 09/06/2022 11:14:16 [name] ([id number]) [id number] Page 4 of 5 -------------------------------------------------------------------------------- Wound Assessment Details Patient Name: Date of Service: [name] 09/06/2022 11:30 A M Medical Record Number: [id number] Patient Account Number: [id number] Date of Birth/Sex: Treating RN: 12-14-1950 (72 y.o. F) Primary Care Money Mckeithan: Jayme Cloud., [name] Other Clinician: Referring Demarkus Remmel: Treating Diera Wirkkala/Extender: Crissie Figures in Treatment: 35 Wound Status Wound  Number: 1 Primary Etiology: Pressure Ulcer Wound Location: Sacrum Wound Status: Open Wounding Event: Pressure Injury Date Acquired: 10/09/2021 Weeks Of Treatment: 35 Clustered Wound: No Wound Measurements Length: (cm) 0.3 Width: (cm) 0.2 Depth: (cm) 1 Area: (cm) 0.047 Volume: (cm) 0.047 % Reduction in Area: 86.7% % Reduction in Volume: 81% Wound Description Classification: Category/Stage IV Exudate Amount: Medium Exudate Type: Serosanguineous Exudate Color: red, brown Periwound Skin Texture Texture Color No Abnormalities Noted: No No Abnormalities Noted: No Moisture No Abnormalities Noted: No Treatment Notes Wound #1 (Sacrum) Cleanser  Normal Saline Discharge Instruction: Cleanse the wound with Normal Saline prior to applying a clean dressing using gauze sponges, not tissue or cotton balls. Vashe 5.8 (oz) Discharge Instruction: Cleanse the wound with Vashe prior to applying a clean dressing using gauze sponges, not tissue or cotton balls. Peri-Wound Care Skin Prep Discharge Instruction: Use skin prep as directed Topical Primary Dressing Endoform 2x2 in Discharge Instruction: Moisten with saline Secondary Dressing Woven Gauze Sponge, Non-Sterile 4x4 in Discharge Instruction: Apply over primary dressing as directed. Zetuvit Plus Silicone Border Dressing 7x7(in/in) Discharge Instruction: Apply silicone border over primary dressing as directed. Secured With [employer] Compression Stockings [name], [name] ([id number]) [id number] Page 5 of 5 Add-Ons Electronic Signature(s) Signed: 09/06/2022 11:18:15 AM By: Thayer Dallas Entered By: Thayer Dallas on 09/06/2022 11:10:31 -------------------------------------------------------------------------------- Vitals Details Patient Name: Date of Service: Marissa Berthold Cardenas. 09/06/2022 11:30 A M Medical Record Number: [id number] Patient Account Number: [id number] Date of Birth/Sex: Treating  RN: 06/09/1950 (72 y.o. F) Primary Care Iriel Nason: Jayme Cloud., [name] Other Clinician: Referring Lianah Peed: Treating Darrill Vreeland/Extender: Crissie Figures in Treatment: 35 Vital Signs Time Taken: 11:10 Reference Range: 80 - 120 mg / dl Height (in): 64 Weight (lbs): 97 Body Mass Index (BMI): 16.6 Electronic Signature(s) Signed: 09/06/2022 11:18:15 AM By: Thayer Dallas Entered By: Thayer Dallas on 09/06/2022 11:10:19

## 2022-09-09 ENCOUNTER — Encounter (HOSPITAL_BASED_OUTPATIENT_CLINIC_OR_DEPARTMENT_OTHER): Payer: 59 | Attending: Internal Medicine | Admitting: Internal Medicine

## 2022-09-09 DIAGNOSIS — M6281 Muscle weakness (generalized): Secondary | ICD-10-CM | POA: Diagnosis not present

## 2022-09-09 DIAGNOSIS — Z681 Body mass index (BMI) 19 or less, adult: Secondary | ICD-10-CM | POA: Diagnosis not present

## 2022-09-09 DIAGNOSIS — E43 Unspecified severe protein-calorie malnutrition: Secondary | ICD-10-CM | POA: Insufficient documentation

## 2022-09-09 DIAGNOSIS — L89153 Pressure ulcer of sacral region, stage 3: Secondary | ICD-10-CM | POA: Insufficient documentation

## 2022-09-09 DIAGNOSIS — M8648 Chronic osteomyelitis with draining sinus, other site: Secondary | ICD-10-CM | POA: Diagnosis not present

## 2022-09-09 DIAGNOSIS — L89154 Pressure ulcer of sacral region, stage 4: Secondary | ICD-10-CM | POA: Diagnosis not present

## 2022-09-09 NOTE — Progress Notes (Signed)
HIRAL, DER Cardenas ([id number]) [id number] Page 1 of 7 Visit Report for 09/04/2022 Arrival Information Details Patient Name: Date of Service: [name] 09/04/2022 8:45 A M Medical Record Number: [id number] Patient Account Number: [id number] Date of Birth/Sex: Treating RN: [birth year] (72 y.o. F) Primary Care Rafeal Skibicki: Jayme Cloud., [name] Other Clinician: Referring Bryndan Bilyk: Treating Nimesh Riolo/Extender: Crista Curb in Treatment: 35 Visit Information History Since Last Visit All ordered tests and consults were completed: No Patient Arrived: Ambulatory Added or deleted any medications: No Arrival Time: 08:04 Any new allergies or adverse reactions: No Accompanied By: self Had a fall or experienced change in No Transfer Assistance: None activities of daily living that may affect Patient Identification Verified: Yes risk of falls: Secondary Verification Process Completed: Yes Signs or symptoms of abuse/neglect since last visito No Patient Requires Transmission-Based Precautions: No Hospitalized since last visit: No Patient Has Alerts: No Implantable device outside of the clinic excluding No cellular tissue based products placed in the center since last visit: Pain Present Now: No Electronic Signature(s) Signed: 09/04/2022 9:39:58 AM By: Dayton Scrape Entered By: Dayton Scrape on 09/04/2022 08:04:36 -------------------------------------------------------------------------------- Clinic Level of Care Assessment Details Patient Name: Date of Service: [name] 09/04/2022 8:45 A M Medical Record Number: [id number] Patient Account Number: [id number] Date of Birth/Sex: Treating RN: 07-Aug-1950 (72 y.o. F) Primary Care Herbie Lehrmann: Jayme Cloud., [name] Other Clinician: Referring [place] Marchiano: Treating Rylee Huestis/Extender: Crista Curb in Treatment: 35 Clinic Level of Care Assessment Items TOOL 4 Quantity  Score []  - 0 Use when only an EandM is performed on FOLLOW-UP visit ASSESSMENTS - Nursing Assessment / Reassessment []  - 0 Reassessment of Marissa-morbidities (includes updates in patient status) []  - 0 Reassessment of Adherence to Treatment Plan ASSESSMENTS - Wound and Skin A ssessment / Reassessment X - Simple Wound Assessment / Reassessment - one wound 1 5 []  - 0 Complex Wound Assessment / Reassessment - multiple wounds []  - 0 Dermatologic / Skin Assessment (not related to wound area) ASSESSMENTS - Focused Assessment []  - 0 Circumferential Edema Measurements - multi extremities []  - 0 Nutritional Assessment / Counseling / Intervention [name], [name] ([id number]) [phone].pdf Page 2 of 7 []  - 0 Lower Extremity Assessment (monofilament, tuning fork, pulses) []  - 0 Peripheral Arterial Disease Assessment (using hand held doppler) ASSESSMENTS - Ostomy and/or Continence Assessment and Care []  - 0 Incontinence Assessment and Management []  - 0 Ostomy Care Assessment and Management (repouching, etc.) PROCESS - Coordination of Care X - Simple Patient / Family Education for ongoing care 1 15 []  - 0 Complex (extensive) Patient / Family Education for ongoing care X- 1 10 Staff obtains Chiropractor, Records, T Results / Process Orders est []  - 0 Staff telephones HHA, Nursing Homes / Clarify orders / etc []  - 0 Routine Transfer to another Facility (non-emergent condition) []  - 0 Routine Hospital Admission (non-emergent condition) []  - 0 New Admissions / Manufacturing engineer / Ordering NPWT Apligraf, etc. , []  - 0 Emergency Hospital Admission (emergent condition) X- 1 10 Simple Discharge Coordination []  - 0 Complex (extensive) Discharge Coordination PROCESS - Special Needs []  - 0 Pediatric / Minor Patient Management []  - 0 Isolation Patient Management []  - 0 Hearing / Language / Visual special needs []  - 0 Assessment of Community assistance  (transportation, D/C planning, etc.) []  - 0 Additional assistance / Altered mentation []  - 0 Support Surface(s) Assessment (bed, cushion, seat, etc.) INTERVENTIONS - Wound Cleansing / Measurement  X - Simple Wound Cleansing - one wound 1 5 []  - 0 Complex Wound Cleansing - multiple wounds X- 1 5 Wound Imaging (photographs - any number of wounds) []  - 0 Wound Tracing (instead of photographs) X- 1 5 Simple Wound Measurement - one wound []  - 0 Complex Wound Measurement - multiple wounds INTERVENTIONS - Wound Dressings X - Small Wound Dressing one or multiple wounds 1 10 []  - 0 Medium Wound Dressing one or multiple wounds []  - 0 Large Wound Dressing one or multiple wounds []  - 0 Application of Medications - topical []  - 0 Application of Medications - injection INTERVENTIONS - Miscellaneous []  - 0 External ear exam []  - 0 Specimen Collection (cultures, biopsies, blood, body fluids, etc.) []  - 0 Specimen(s) / Culture(s) sent or taken to Lab for analysis []  - 0 Patient Transfer (multiple staff / Nurse, adult / Similar devices) []  - 0 Simple Staple / Suture removal (25 or less) []  - 0 Complex Staple / Suture removal (26 or more) []  - 0 Hypo / Hyperglycemic Management (close monitor of Blood Glucose) [name], [name] ([id number]) [id number] Page 3 of 7 []  - 0 Ankle / Brachial Index (ABI) - do not check if billed separately X- 1 5 Vital Signs Has the patient been seen at the hospital within the last three years: Yes Total Score: 70 Level Of Care: New/Established - Level 2 Electronic Signature(s) Signed: 09/09/2022 2:58:52 PM By: Pearletha Alfred Entered By: Pearletha Alfred on 09/04/2022 15:46:29 -------------------------------------------------------------------------------- Encounter Discharge Information Details Patient Name: Date of Service: Marissa Berthold Cardenas. 09/04/2022 8:45 A M Medical Record Number: [id number] Patient Account Number: [id number] Date  of Birth/Sex: Treating RN: 12/13/1950 (72 y.o. [name] Primary Care Ishi Danser: Jayme Cloud., [name] Other Clinician: Referring Lalana Wachter: Treating Roby Spalla/Extender: Crista Curb in Treatment: 35 Encounter Discharge Information Items Discharge Condition: Stable Ambulatory Status: Ambulatory Discharge Destination: Home Transportation: Private Auto Accompanied By: self Schedule Follow-up Appointment: Yes Clinical Summary of Care: Patient Declined Electronic Signature(s) Signed: 09/04/2022 4:45:25 PM By: Karie Schwalbe RN Entered By: Karie Schwalbe on 09/04/2022 16:44:14 -------------------------------------------------------------------------------- Lower Extremity Assessment Details Patient Name: Date of Service: [name] 09/04/2022 8:45 A M Medical Record Number: [id number] Patient Account Number: [id number] Date of Birth/Sex: Treating RN: 01/17/1951 (72 y.o. [name] Primary Care Kayleen Alig: Jayme Cloud., [name] Other Clinician: Referring Elouise Divelbiss: Treating Audry Kauzlarich/Extender: Crista Curb in Treatment: 35 Electronic Signature(s) Signed: 09/04/2022 4:45:25 PM By: Karie Schwalbe RN Entered By: Karie Schwalbe on 09/04/2022 08:47:33 -------------------------------------------------------------------------------- Multi-Disciplinary Care Plan Details Patient Name: Date of Service: Marissa Berthold Cardenas. 09/04/2022 8:45 A M Medical Record Number: [id number] Patient Account Number: [id number] [name], [name] ([id number]) [id number] Page 4 of 7 Date of Birth/Sex: Treating RN: [birth year] (72 y.o. F) Primary Care Chaunce Winkels: Other Clinician: Jayme Cloud., [name] Referring Giuliano Preece: Treating Arista Kettlewell/Extender: Crista Curb in Treatment: 35 Multidisciplinary Care Plan reviewed with physician Active Inactive Osteomyelitis Nursing Diagnoses: Infection:  osteomyelitis Knowledge deficit related to disease process and management Goals: Diagnostic evaluation for osteomyelitis completed as ordered Date Initiated: 07/03/2022 Target Resolution Date: 10/09/2022 Goal Status: Active Patient/caregiver will verbalize understanding of disease process and disease management Date Initiated: 07/03/2022 Target Resolution Date: 10/09/2022 Goal Status: Active Interventions: Assess for signs and symptoms of osteomyelitis resolution every visit Provide education on osteomyelitis Screen for HBO Treatment Activities: Consult for HBO : 07/03/2022 MRI : 06/26/2022 Systemic antibiotics : 07/03/2022 White  Blood Cell Scan : 07/03/2022 Notes: Pain, Acute or Chronic Nursing Diagnoses: Pain, acute or chronic: actual or potential Potential alteration in comfort, pain Goals: Patient will verbalize adequate pain control and receive pain control interventions during procedures as needed Date Initiated: 01/02/2022 Target Resolution Date: 10/09/2022 Goal Status: Active Patient/caregiver will verbalize adequate pain control between visits Date Initiated: 01/02/2022 Date Inactivated: 07/17/2022 Target Resolution Date: 07/13/2022 Goal Status: Met Interventions: Encourage patient to take pain medications as prescribed Provide education on pain management Reposition patient for comfort Treatment Activities: Administer pain control measures as ordered : 01/02/2022 Notes: Electronic Signature(s) Signed: 09/04/2022 3:44:57 PM By: Pearletha Alfred Entered By: Pearletha Alfred on 09/04/2022 15:44:57 Pain Assessment Details -------------------------------------------------------------------------------- [name] ([id number]) [id number] Page 5 of 7 Patient Name: Date of Service: [name] 09/04/2022 8:45 A M Medical Record Number: [id number] Patient Account Number: [id number] Date of Birth/Sex: Treating RN: 06/01/1950 (72 y.o. F) Primary  Care Hanifa Antonetti: Jayme Cloud., [name] Other Clinician: Referring Zacarias Krauter: Treating Kenon Delashmit/Extender: Crista Curb in Treatment: 35 Active Problems Location of Pain Severity and Description of Pain Patient Has Paino No Site Locations Pain Management and Medication Current Pain Management: Electronic Signature(s) Signed: 09/04/2022 9:39:58 AM By: Dayton Scrape Entered By: Dayton Scrape on 09/04/2022 08:05:08 -------------------------------------------------------------------------------- Patient/Caregiver Education Details Patient Name: Date of Service: [name] 6/26/2024andnbsp8:45 A M Medical Record Number: [id number] Patient Account Number: [id number] Date of Birth/Gender: Treating RN: 01-Sep-1950 (72 y.o. F) Primary Care Physician: Jayme Cloud., [name] Other Clinician: Referring Physician: Treating Physician/Extender: Crista Curb in Treatment: 35 Education Assessment Education Provided To: Patient Education Topics Provided Wound/Skin Impairment: Methods: Explain/Verbal Responses: State content correctly [employer]) Signed: 09/09/2022 2:58:52 PM By: Pearletha Alfred Entered By: Pearletha Alfred on 09/04/2022 15:45:10 [name] ([id number]) [id number] Page 6 of 7 -------------------------------------------------------------------------------- Wound Assessment Details Patient Name: Date of Service: [name] 09/04/2022 8:45 A M Medical Record Number: [id number] Patient Account Number: [id number] Date of Birth/Sex: Treating RN: [birth year] (72 y.o. F) Primary Care Braiden Rodman: Jayme Cloud., [name] Other Clinician: Referring Nymir Ringler: Treating Loella Hickle/Extender: Crista Curb in Treatment: 35 Wound Status Wound Number: 1 Primary Etiology: Pressure Ulcer Wound Location: Sacrum Wound Status: Open Wounding Event: Pressure Injury Comorbid History:  Colitis Date Acquired: 10/09/2021 Weeks Of Treatment: 35 Clustered Wound: No Photos Wound Measurements Length: (cm) 0.3 Width: (cm) 0.2 Depth: (cm) 1 Area: (cm) 0.047 Volume: (cm) 0.047 % Reduction in Area: 86.7% % Reduction in Volume: 81% Tunneling: No Undermining: Yes Starting Position (o'clock): 12 Ending Position (o'clock): 12 Maximum Distance: (cm) 1.8 Wound Description Classification: Category/Stage IV Exudate Amount: Medium Exudate Type: Serosanguineous Exudate Color: red, brown Foul Odor After Cleansing: No Slough/Fibrino Yes Wound Bed Granulation Amount: Large (67-100%) Exposed Structure Granulation Quality: Red Fat Layer (Subcutaneous Tissue) Exposed: Yes Necrotic Amount: Small (1-33%) Necrotic Quality: Adherent Slough Periwound Skin Texture Texture Color No Abnormalities Noted: No No Abnormalities Noted: Yes Moisture Temperature / Pain No Abnormalities Noted: Yes Temperature: No Abnormality Electronic Signature(s) Signed: 09/04/2022 4:45:25 PM By: Karie Schwalbe RN Entered By: Karie Schwalbe on 09/04/2022 08:47:59 [name] ([id number]) [id number] Page 7 of 7 -------------------------------------------------------------------------------- Vitals Details Patient Name: Date of Service: [name] 09/04/2022 8:45 A M Medical Record Number: [id number] Patient Account Number: [id number] Date of Birth/Sex: Treating RN: Jan 18, 1951 (72 y.o. F) Primary Care Dontaye Hur: Jayme Cloud., [name] Other Clinician: Referring Draiden Mirsky: Treating Malcome Ambrocio/Extender: Lenda Kelp  Jayme Cloud., [name] in Treatment: 35 Vital Signs Time Taken: 08:04 Temperature (F): 97.6 Height (in): 64 Pulse (bpm): 68 Weight (lbs): 97 Respiratory Rate (breaths/min): 20 Body Mass Index (BMI): 16.6 Blood Pressure (mmHg): 130/68 Reference Range: 80 - 120 mg / dl Electronic Signature(s) Signed: 09/04/2022 9:39:58 AM By: Dayton Scrape Entered By:  Dayton Scrape on 09/04/2022 08:05:02

## 2022-09-09 NOTE — Progress Notes (Signed)
GIA, SEES ([id number]) [id number] Page 1 of 5 Visit Report for 09/09/2022 Arrival Information Details Patient Name: Date of Service: [name]. 09/09/2022 1:30 PM Medical Record Number: [id number] Patient Account Number: [id number] Date of Birth/Sex: Treating RN: 1950/04/02 (72 y.o. F) Primary Care Lyan Holck: Jayme Cloud., [name] Other Clinician: Referring Bentlie Catanzaro: Treating Herta Hink/Extender: Crissie Figures in Treatment: 35 Visit Information History Since Last Visit Added or deleted any medications: No Patient Arrived: Ambulatory Any new allergies or adverse reactions: No Arrival Time: 13:12 Had a fall or experienced change in No Accompanied By: self activities of daily living that may affect Transfer Assistance: None risk of falls: Patient Identification Verified: Yes Signs or symptoms of abuse/neglect since last visito No Secondary Verification Process Completed: Yes Hospitalized since last visit: No Patient Requires Transmission-Based Precautions: No Implantable device outside of the clinic excluding No Patient Has Alerts: No cellular tissue based products placed in the center since last visit: Has Dressing in Place as Prescribed: Yes Pain Present Now: No Electronic Signature(s) Signed: 09/09/2022 4:27:23 PM By: Thayer Dallas Entered By: Thayer Dallas on 09/09/2022 13:24:08 -------------------------------------------------------------------------------- Clinic Level of Care Assessment Details Patient Name: Date of Service: [name]. 09/09/2022 1:30 PM Medical Record Number: [id number] Patient Account Number: [id number] Date of Birth/Sex: Treating RN: 11-03-1950 (72 y.o. F) Primary Care Danylle Ouk: Jayme Cloud., [name] Other Clinician: Referring Octavie Westerhold: Treating Debbie Yearick/Extender: Crissie Figures in Treatment: 35 Clinic Level of Care Assessment Items TOOL 4 Quantity  Score X- 1 0 Use when only an EandM is performed on FOLLOW-UP visit ASSESSMENTS - Nursing Assessment / Reassessment X- 1 10 Reassessment of Marissa-morbidities (includes updates in patient status) X- 1 5 Reassessment of Adherence to Treatment Plan ASSESSMENTS - Wound and Skin A ssessment / Reassessment X - Simple Wound Assessment / Reassessment - one wound 1 5 []  - 0 Complex Wound Assessment / Reassessment - multiple wounds []  - 0 Dermatologic / Skin Assessment (not related to wound area) ASSESSMENTS - Focused Assessment []  - 0 Circumferential Edema Measurements - multi extremities []  - 0 Nutritional Assessment / Counseling / Intervention [name], [name] ([id number]) [id number] Page 2 of 5 []  - 0 Lower Extremity Assessment (monofilament, tuning fork, pulses) []  - 0 Peripheral Arterial Disease Assessment (using hand held doppler) ASSESSMENTS - Ostomy and/or Continence Assessment and Care []  - 0 Incontinence Assessment and Management []  - 0 Ostomy Care Assessment and Management (repouching, etc.) PROCESS - Coordination of Care X - Simple Patient / Family Education for ongoing care 1 15 []  - 0 Complex (extensive) Patient / Family Education for ongoing care []  - 0 Staff obtains Chiropractor, Records, T Results / Process Orders est []  - 0 Staff telephones HHA, Nursing Homes / Clarify orders / etc []  - 0 Routine Transfer to another Facility (non-emergent condition) []  - 0 Routine Hospital Admission (non-emergent condition) []  - 0 New Admissions / Manufacturing engineer / Ordering NPWT Apligraf, etc. , []  - 0 Emergency Hospital Admission (emergent condition) X- 1 10 Simple Discharge Coordination []  - 0 Complex (extensive) Discharge Coordination PROCESS - Special Needs []  - 0 Pediatric / Minor Patient Management []  - 0 Isolation Patient Management []  - 0 Hearing / Language / Visual special needs []  - 0 Assessment of Community assistance  (transportation, D/C planning, etc.) []  - 0 Additional assistance / Altered mentation []  - 0 Support Surface(s) Assessment (bed, cushion, seat, etc.) INTERVENTIONS - Wound Cleansing / Measurement X - Simple Wound Cleansing -  one wound 1 5 []  - 0 Complex Wound Cleansing - multiple wounds []  - 0 Wound Imaging (photographs - any number of wounds) []  - 0 Wound Tracing (instead of photographs) []  - 0 Simple Wound Measurement - one wound []  - 0 Complex Wound Measurement - multiple wounds INTERVENTIONS - Wound Dressings X - Small Wound Dressing one or multiple wounds 1 10 []  - 0 Medium Wound Dressing one or multiple wounds []  - 0 Large Wound Dressing one or multiple wounds []  - 0 Application of Medications - topical []  - 0 Application of Medications - injection INTERVENTIONS - Miscellaneous []  - 0 External ear exam []  - 0 Specimen Collection (cultures, biopsies, blood, body fluids, etc.) []  - 0 Specimen(s) / Culture(s) sent or taken to Lab for analysis []  - 0 Patient Transfer (multiple staff / Nurse, adult / Similar devices) []  - 0 Simple Staple / Suture removal (25 or less) []  - 0 Complex Staple / Suture removal (26 or more) []  - 0 Hypo / Hyperglycemic Management (close monitor of Blood Glucose) [name], [name] ([id number]) [id number] Page 3 of 5 []  - 0 Ankle / Brachial Index (ABI) - do not check if billed separately []  - 0 Vital Signs Has the patient been seen at the hospital within the last three years: Yes Total Score: 60 Level Of Care: New/Established - Level 2 Electronic Signature(s) Signed: 09/09/2022 4:27:23 PM By: Thayer Dallas Entered By: Thayer Dallas on 09/09/2022 13:25:34 -------------------------------------------------------------------------------- Encounter Discharge Information Details Patient Name: Date of Service: [name]. 09/09/2022 1:30 PM Medical Record Number: [id number] Patient Account Number:  [id number] Date of Birth/Sex: Treating RN: Mar 04, 1951 (72 y.o. F) Primary Care Makesha Belitz: Jayme Cloud., Cardenas Sawyers Clinician: Thayer Dallas Referring Kaoru Rezendes: Treating Zaylen Susman/Extender: Allena Katz., [name] in Treatment: 35 Encounter Discharge Information Items Discharge Condition: Stable Ambulatory Status: Ambulatory Discharge Destination: Home Transportation: Private Auto Accompanied By: self Schedule Follow-up Appointment: Yes Clinical Summary of Care: Electronic Signature(s) Signed: 09/09/2022 4:27:23 PM By: Thayer Dallas Entered By: Thayer Dallas on 09/09/2022 13:26:16 -------------------------------------------------------------------------------- Patient/Caregiver Education Details Patient Name: Date of Service: [name] 7/1/2024andnbsp1:30 PM Medical Record Number: [id number] Patient Account Number: [id number] Date of Birth/Gender: Treating RN: 12-30-1950 (72 y.o. F) Primary Care Physician: Jayme Cloud., [name] Other Clinician: Thayer Dallas Referring Physician: Treating Physician/Extender: Crissie Figures in Treatment: 35 Education Assessment Education Provided To: Patient Education Topics Provided Electronic Signature(s) Signed: 09/09/2022 4:27:23 PM By: Thayer Dallas Entered By: Thayer Dallas on 09/09/2022 13:26:01 [name] ([id number]) [id number] Page 4 of 5 -------------------------------------------------------------------------------- Wound Assessment Details Patient Name: Date of Service: [name] 09/09/2022 1:30 PM Medical Record Number: [id number] Patient Account Number: [id number] Date of Birth/Sex: Treating RN: [birth year] (72 y.o. F) Primary Care Hazelle Woollard: Jayme Cloud., [name] Other Clinician: Referring Nadia Torr: Treating Vy Badley/Extender: Crissie Figures in Treatment: 35 Wound Status Wound Number: 1 Primary Etiology:  Pressure Ulcer Wound Location: Sacrum Wound Status: Open Wounding Event: Pressure Injury Comorbid History: Colitis Date Acquired: 10/09/2021 Weeks Of Treatment: 35 Clustered Wound: No Wound Measurements Length: (cm) 0.3 Width: (cm) 0.2 Depth: (cm) 1 Area: (cm) 0.047 Volume: (cm) 0.047 % Reduction in Area: 86.7% % Reduction in Volume: 81% Wound Description Classification: Category/Stage IV Exudate Amount: Medium Exudate Type: Serosanguineous Exudate Color: red, brown Periwound Skin Texture Texture Color No Abnormalities Noted: No No Abnormalities Noted: No Moisture No Abnormalities Noted: No Treatment Notes Wound #1 (Sacrum) Cleanser Normal Saline Discharge Instruction:  Cleanse the wound with Normal Saline prior to applying a clean dressing using gauze sponges, not tissue or cotton balls. Vashe 5.8 (oz) Discharge Instruction: Cleanse the wound with Vashe prior to applying a clean dressing using gauze sponges, not tissue or cotton balls. Peri-Wound Care Skin Prep Discharge Instruction: Use skin prep as directed Topical Primary Dressing Endoform 2x2 in Discharge Instruction: Moisten with saline Secondary Dressing Woven Gauze Sponge, Non-Sterile 4x4 in Discharge Instruction: Apply over primary dressing as directed. Zetuvit Plus Silicone Border Dressing 7x7(in/in) Discharge Instruction: Apply silicone border over primary dressing as directed. Secured With [employer] Compression Stockings [name], [name] ([id number]) [id number] Page 5 of 5 Add-Ons Electronic Signature(s) Signed: 09/09/2022 4:27:23 PM By: Thayer Dallas Entered By: Thayer Dallas on 09/09/2022 13:24:34 -------------------------------------------------------------------------------- Vitals Details Patient Name: Date of Service: [name], Marissa Sailors Cardenas. 09/09/2022 1:30 PM Medical Record Number: [id number] Patient Account Number: [id number] Date of Birth/Sex: Treating  RN: Aug 10, 1950 (72 y.o. F) Primary Care Bijon Mineer: Jayme Cloud., [name] Other Clinician: Referring Avnoor Koury: Treating Machele Deihl/Extender: Crissie Figures in Treatment: 35 Vital Signs Time Taken: 13:24 Reference Range: 80 - 120 mg / dl Height (in): 64 Weight (lbs): 97 Body Mass Index (BMI): 16.6 Electronic Signature(s) Signed: 09/09/2022 4:27:23 PM By: Thayer Dallas Entered By: Thayer Dallas on 09/09/2022 13:24:15

## 2022-09-09 NOTE — Progress Notes (Signed)
KERSTYN, FU ([id number]) [id number] Page 1 of 1 Visit Report for 09/09/2022 SuperBill Details Patient Name: Date of Service: [name] 09/09/2022 Medical Record Number: [id number] Patient Account Number: [id number] Date of Birth/Sex: Treating RN: [birth year] (72 y.o. F) Primary Care Provider: Jayme Cloud., [name] Other Clinician: Referring Provider: Treating Provider/Extender: Crissie Figures in Treatment: 35 Diagnosis Coding ICD-10 Codes Code Description [phone] Pressure ulcer of sacral region, stage 4 M86.48 Chronic osteomyelitis with draining sinus, other site M62.81 Muscle weakness (generalized) E43 Unspecified severe protein-calorie malnutrition Facility Procedures CPT4 Code Description Modifier Quantity [id number] [phone] - WOUND CARE VISIT-LEV 2 EST PT 1 Electronic Signature(s) Signed: 09/09/2022 2:53:49 PM By: Geralyn Corwin DO Signed: 09/09/2022 4:27:23 PM By: Thayer Dallas Entered By: Thayer Dallas on 09/09/2022 13:26:29

## 2022-09-10 DIAGNOSIS — H0520 Unspecified exophthalmos: Secondary | ICD-10-CM | POA: Diagnosis not present

## 2022-09-11 ENCOUNTER — Encounter (HOSPITAL_BASED_OUTPATIENT_CLINIC_OR_DEPARTMENT_OTHER): Payer: 59 | Admitting: Physician Assistant

## 2022-09-11 DIAGNOSIS — M8648 Chronic osteomyelitis with draining sinus, other site: Secondary | ICD-10-CM | POA: Diagnosis not present

## 2022-09-11 DIAGNOSIS — L89154 Pressure ulcer of sacral region, stage 4: Secondary | ICD-10-CM | POA: Diagnosis not present

## 2022-09-11 DIAGNOSIS — M6281 Muscle weakness (generalized): Secondary | ICD-10-CM | POA: Diagnosis not present

## 2022-09-11 DIAGNOSIS — E43 Unspecified severe protein-calorie malnutrition: Secondary | ICD-10-CM | POA: Diagnosis not present

## 2022-09-11 DIAGNOSIS — Z681 Body mass index (BMI) 19 or less, adult: Secondary | ICD-10-CM | POA: Diagnosis not present

## 2022-09-11 DIAGNOSIS — L89153 Pressure ulcer of sacral region, stage 3: Secondary | ICD-10-CM | POA: Diagnosis not present

## 2022-09-11 NOTE — Progress Notes (Signed)
[name], [name] ([id number]) [id number] Page 1 of 7 Visit Report for 09/11/2022 Arrival Information Details Patient Name: Date of Service: [name] 09/11/2022 8:45 A M Medical Record Number: [id number] Patient Account Number: [id number] Date of Birth/Sex: Treating RN: 05-02-1950 (72 y.o. [name], [name] Primary Care Kasie Leccese: Jayme Cloud., [name] Other Clinician: Referring Briceyda Abdullah: Treating Gerrick Ray/Extender: Crista Curb in Treatment: 36 Visit Information History Since Last Visit Added or deleted any medications: No Patient Arrived: Ambulatory Any new allergies or adverse reactions: No Arrival Time: 08:37 Had a fall or experienced change in No Accompanied By: self activities of daily living that may affect Transfer Assistance: None risk of falls: Patient Identification Verified: Yes Signs or symptoms of abuse/neglect since last visito No Secondary Verification Process Completed: Yes Hospitalized since last visit: No Patient Requires Transmission-Based Precautions: No Implantable device outside of the clinic excluding No Patient Has Alerts: No cellular tissue based products placed in the center since last visit: Has Dressing in Place as Prescribed: Yes Pain Present Now: No Electronic Signature(s) Signed: 09/11/2022 4:48:32 PM By: Shawn Stall RN, BSN Entered By: Shawn Stall on 09/11/2022 08:38:08 -------------------------------------------------------------------------------- Clinic Level of Care Assessment Details Patient Name: Date of Service: Marissa Deliah Goody Cardenas. 09/11/2022 8:45 A M Medical Record Number: [id number] Patient Account Number: [id number] Date of Birth/Sex: Treating RN: 23-Nov-1950 (72 y.o. [name], [name] Primary Care Carmeron Heady: Jayme Cloud., [name] Other Clinician: Referring Gayanne Prescott: Treating Corda Shutt/Extender: Crista Curb in Treatment: 36 Clinic Level of Care  Assessment Items TOOL 4 Quantity Score X- 1 0 Use when only an EandM is performed on FOLLOW-UP visit ASSESSMENTS - Nursing Assessment / Reassessment X- 1 10 Reassessment of Marissa-morbidities (includes updates in patient status) X- 1 5 Reassessment of Adherence to Treatment Plan ASSESSMENTS - Wound and Skin A ssessment / Reassessment X - Simple Wound Assessment / Reassessment - one wound 1 5 []  - 0 Complex Wound Assessment / Reassessment - multiple wounds []  - 0 Dermatologic / Skin Assessment (not related to wound area) ASSESSMENTS - Focused Assessment []  - 0 Circumferential Edema Measurements - multi extremities []  - 0 Nutritional Assessment / Counseling / Intervention [name], [name] ([id number]) [id number] Page 2 of 7 []  - 0 Lower Extremity Assessment (monofilament, tuning fork, pulses) []  - 0 Peripheral Arterial Disease Assessment (using hand held doppler) ASSESSMENTS - Ostomy and/or Continence Assessment and Care []  - 0 Incontinence Assessment and Management []  - 0 Ostomy Care Assessment and Management (repouching, etc.) PROCESS - Coordination of Care X - Simple Patient / Family Education for ongoing care 1 15 []  - 0 Complex (extensive) Patient / Family Education for ongoing care X- 1 10 Staff obtains Chiropractor, Records, T Results / Process Orders est []  - 0 Staff telephones HHA, Nursing Homes / Clarify orders / etc []  - 0 Routine Transfer to another Facility (non-emergent condition) []  - 0 Routine Hospital Admission (non-emergent condition) []  - 0 New Admissions / Manufacturing engineer / Ordering NPWT Apligraf, etc. , []  - 0 Emergency Hospital Admission (emergent condition) X- 1 10 Simple Discharge Coordination []  - 0 Complex (extensive) Discharge Coordination PROCESS - Special Needs []  - 0 Pediatric / Minor Patient Management []  - 0 Isolation Patient Management []  - 0 Hearing / Language / Visual special needs []  -  0 Assessment of Community assistance (transportation, D/C planning, etc.) []  - 0 Additional assistance / Altered mentation []  - 0 Support Surface(s) Assessment (bed, cushion, seat, etc.) INTERVENTIONS -  Wound Cleansing / Measurement X - Simple Wound Cleansing - one wound 1 5 []  - 0 Complex Wound Cleansing - multiple wounds X- 1 5 Wound Imaging (photographs - any number of wounds) []  - 0 Wound Tracing (instead of photographs) X- 1 5 Simple Wound Measurement - one wound []  - 0 Complex Wound Measurement - multiple wounds INTERVENTIONS - Wound Dressings X - Small Wound Dressing one or multiple wounds 1 10 []  - 0 Medium Wound Dressing one or multiple wounds []  - 0 Large Wound Dressing one or multiple wounds []  - 0 Application of Medications - topical []  - 0 Application of Medications - injection INTERVENTIONS - Miscellaneous []  - 0 External ear exam []  - 0 Specimen Collection (cultures, biopsies, blood, body fluids, etc.) []  - 0 Specimen(s) / Culture(s) sent or taken to Lab for analysis []  - 0 Patient Transfer (multiple staff / Nurse, adult / Similar devices) []  - 0 Simple Staple / Suture removal (25 or less) []  - 0 Complex Staple / Suture removal (26 or more) []  - 0 Hypo / Hyperglycemic Management (close monitor of Blood Glucose) [name], [name] ([id number]) [id number] Page 3 of 7 []  - 0 Ankle / Brachial Index (ABI) - do not check if billed separately X- 1 5 Vital Signs Has the patient been seen at the hospital within the last three years: Yes Total Score: 85 Level Of Care: New/Established - Level 3 Electronic Signature(s) Signed: 09/11/2022 4:48:32 PM By: Shawn Stall RN, BSN Entered By: Shawn Stall on 09/11/2022 08:46:55 -------------------------------------------------------------------------------- Encounter Discharge Information Details Patient Name: Date of Service: Marissa [name], Marissa Sailors Cardenas. 09/11/2022 8:45 A M Medical Record Number:  [id number] Patient Account Number: [id number] Date of Birth/Sex: Treating RN: [birth year] (72 y.o. [name], [name] Primary Care Nada Godley: Jayme Cloud., [name] Other Clinician: Referring Elija Mccamish: Treating Geraldo Haris/Extender: Crista Curb in Treatment: 36 Encounter Discharge Information Items Discharge Condition: Stable Ambulatory Status: Ambulatory Discharge Destination: Home Transportation: Private Auto Accompanied By: self Schedule Follow-up Appointment: Yes Clinical Summary of Care: Electronic Signature(s) Signed: 09/11/2022 4:48:32 PM By: Shawn Stall RN, BSN Entered By: Shawn Stall on 09/11/2022 08:47:18 -------------------------------------------------------------------------------- Lower Extremity Assessment Details Patient Name: Date of Service: Marissa Deliah Goody Cardenas. 09/11/2022 8:45 A M Medical Record Number: [id number] Patient Account Number: [id number] Date of Birth/Sex: Treating RN: 07/21/1950 (72 y.o. [name], [name] Primary Care Winni Ehrhard: Jayme Cloud., [name] Other Clinician: Referring Maikol Grassia: Treating Josel Keo/Extender: Crista Curb in Treatment: 36 Electronic Signature(s) Signed: 09/11/2022 4:48:32 PM By: Shawn Stall RN, BSN Entered By: Shawn Stall on 09/11/2022 08:38:26 -------------------------------------------------------------------------------- Multi-Disciplinary Care Plan Details Patient Name: Date of Service: Marissa [name], Marissa Sailors Cardenas. 09/11/2022 8:45 A M Medical Record Number: [id number] Patient Account Number: [id number] [name], [name] ([id number]) [id number] Page 4 of 7 Date of Birth/Sex: Treating RN: 03/11/1951 (72 y.o. [name] Primary Care Shaka Zech: Other Clinician: Jayme Cloud., [name] Referring Kamila Broda: Treating Virjean Boman/Extender: Crista Curb in Treatment: 36 Multidisciplinary Care Plan reviewed with physician Active  Inactive Osteomyelitis Nursing Diagnoses: Infection: osteomyelitis Knowledge deficit related to disease process and management Goals: Diagnostic evaluation for osteomyelitis completed as ordered Date Initiated: 07/03/2022 Target Resolution Date: 10/09/2022 Goal Status: Active Patient/caregiver will verbalize understanding of disease process and disease management Date Initiated: 07/03/2022 Target Resolution Date: 10/09/2022 Goal Status: Active Interventions: Assess for signs and symptoms of osteomyelitis resolution every visit Provide education on osteomyelitis Screen for HBO Treatment Activities: Consult for HBO : 07/03/2022  MRI : 06/26/2022 Systemic antibiotics : 07/03/2022 White Blood Cell Scan : 07/03/2022 Notes: Pain, Acute or Chronic Nursing Diagnoses: Pain, acute or chronic: actual or potential Potential alteration in comfort, pain Goals: Patient will verbalize adequate pain control and receive pain control interventions during procedures as needed Date Initiated: 01/02/2022 Target Resolution Date: 10/09/2022 Goal Status: Active Patient/caregiver will verbalize adequate pain control between visits Date Initiated: 01/02/2022 Date Inactivated: 07/17/2022 Target Resolution Date: 07/13/2022 Goal Status: Met Interventions: Encourage patient to take pain medications as prescribed Provide education on pain management Reposition patient for comfort Treatment Activities: Administer pain control measures as ordered : 01/02/2022 Notes: Electronic Signature(s) Signed: 09/11/2022 4:48:32 PM By: Shawn Stall RN, BSN Entered By: Shawn Stall on 09/11/2022 08:45:06 Pain Assessment Details -------------------------------------------------------------------------------- [name] ([id number]) [id number] Page 5 of 7 Patient Name: Date of Service: [name] 09/11/2022 8:45 A M Medical Record Number: [id number] Patient Account Number:  [id number] Date of Birth/Sex: Treating RN: 1950-12-05 (72 y.o. [name], [name] Primary Care Nashali Ditmer: Jayme Cloud., [name] Other Clinician: Referring Areg Bialas: Treating Carrol Bondar/Extender: Crista Curb in Treatment: 36 Active Problems Location of Pain Severity and Description of Pain Patient Has Paino No Site Locations Pain Management and Medication Current Pain Management: Electronic Signature(s) Signed: 09/11/2022 4:48:32 PM By: Shawn Stall RN, BSN Entered By: Shawn Stall on 09/11/2022 08:38:22 -------------------------------------------------------------------------------- Patient/Caregiver Education Details Patient Name: Date of Service: [name] 7/3/2024andnbsp8:45 A M Medical Record Number: [id number] Patient Account Number: [id number] Date of Birth/Gender: Treating RN: 06-Sep-1950 (72 y.o. [name], [name] Primary Care Physician: Jayme Cloud., [name] Other Clinician: Referring Physician: Treating Physician/Extender: Crista Curb in Treatment: 36 Education Assessment Education Provided To: Patient Education Topics Provided Wound/Skin Impairment: Handouts: Caring for Your Ulcer Methods: Explain/Verbal Responses: Reinforcements needed Electronic Signature(s) Signed: 09/11/2022 4:48:32 PM By: Shawn Stall RN, BSN Entered By: Shawn Stall on 09/11/2022 08:45:19 [name] ([id number]) [id number] Page 6 of 7 -------------------------------------------------------------------------------- Wound Assessment Details Patient Name: Date of Service: [name] 09/11/2022 8:45 A M Medical Record Number: [id number] Patient Account Number: [id number] Date of Birth/Sex: Treating RN: 1950-11-08 (72 y.o. [name], [name] Primary Care Morgane Joerger: Jayme Cloud., [name] Other Clinician: Referring Sameria Morss: Treating Delphina Schum/Extender: Crista Curb in  Treatment: 36 Wound Status Wound Number: 1 Primary Etiology: Pressure Ulcer Wound Location: Sacrum Wound Status: Open Wounding Event: Pressure Injury Comorbid History: Colitis Date Acquired: 10/09/2021 Weeks Of Treatment: 36 Clustered Wound: No Photos Wound Measurements Length: (cm) 0 Width: (cm) 0 Depth: (cm) 0 Area: (cm) Volume: (cm) .4 % Reduction in Area: 73.4% .3 % Reduction in Volume: 69.6% .8 Epithelialization: None 0.094 Tunneling: No 0.075 Undermining: Yes Starting Position (o'clock): 12 Ending Position (o'clock): 9 Maximum Distance: (cm) 2.5 Wound Description Classification: Category/Stage IV Wound Margin: Distinct, outline attached Exudate Amount: Medium Exudate Type: Serosanguineous Exudate Color: red, brown Foul Odor After Cleansing: No Slough/Fibrino No Wound Bed Granulation Amount: None Present (0%) Exposed Structure Necrotic Amount: None Present (0%) Fascia Exposed: No Fat Layer (Subcutaneous Tissue) Exposed: No Tendon Exposed: No Muscle Exposed: No Joint Exposed: No Bone Exposed: No Periwound Skin Texture Texture Color No Abnormalities Noted: No No Abnormalities Noted: No Callus: No Atrophie Blanche: No Crepitus: No Cyanosis: No Excoriation: No Ecchymosis: No Induration: No Erythema: No Rash: No Hemosiderin Staining: No Scarring: No Mottled: No Pallor: No Moisture KEEGAN, ALMASRI Cardenas ([id number]) [id number] Page 7 of 7 Rubor: No No Abnormalities  Noted: No Dry / Scaly: No Maceration: No Treatment Notes Wound #1 (Sacrum) Cleanser Normal Saline Discharge Instruction: Cleanse the wound with Normal Saline prior to applying a clean dressing using gauze sponges, not tissue or cotton balls. Vashe 5.8 (oz) Discharge Instruction: Cleanse the wound with Vashe prior to applying a clean dressing using gauze sponges, not tissue or cotton balls. Peri-Wound Care Skin Prep Discharge Instruction: Use skin prep as  directed Topical Primary Dressing Endoform 2x2 in Discharge Instruction: Moisten with saline Secondary Dressing Woven Gauze Sponge, Non-Sterile 4x4 in Discharge Instruction: Apply over primary dressing as directed. Zetuvit Plus Silicone Border Dressing 7x7(in/in) Discharge Instruction: Apply silicone border over primary dressing as directed. Secured With Compression Wrap Compression Stockings Facilities manager) Signed: 09/11/2022 4:48:32 PM By: Shawn Stall RN, BSN Entered By: Shawn Stall on 09/11/2022 08:43:05 -------------------------------------------------------------------------------- Vitals Details Patient Name: Date of Service: Marissa [name], Marissa Sailors Cardenas. 09/11/2022 8:45 A M Medical Record Number: [id number] Patient Account Number: [id number] Date of Birth/Sex: Treating RN: 06/12/1950 (72 y.o. [name], [name] Primary Care Toshua Honsinger: Jayme Cloud., [name] Other Clinician: Referring Raife Lizer: Treating Martrell Eguia/Extender: Crista Curb in Treatment: 36 Vital Signs Time Taken: 08:35 Pulse (bpm): 67 Height (in): 64 Respiratory Rate (breaths/min): 20 Weight (lbs): 97 Blood Pressure (mmHg): 128/65 Body Mass Index (BMI): 16.6 Reference Range: 80 - 120 mg / dl Electronic Signature(s) Signed: 09/11/2022 4:48:32 PM By: Shawn Stall RN, BSN Entered By: Shawn Stall on 09/11/2022 08:38:17

## 2022-09-11 NOTE — Progress Notes (Addendum)
KASSEY, KHALED ([id number]) [id number] Page 1 of 9 Visit Report for 09/11/2022 Chief Complaint Document Details Patient Name: Date of Service: [name] 09/11/2022 8:45 A M Medical Record Number: [id number] Patient Account Number: [id number] Date of Birth/Sex: Treating RN: [birth year] (72 y.o. F) Primary Care Provider: Jayme Cloud., [name] Other Clinician: Referring Provider: Treating Provider/Extender: Crista Curb in Treatment: 36 Information Obtained from: Patient Chief Complaint Pressure ulcer stage 3 Sacrum Electronic Signature(s) Signed: 09/11/2022 8:43:37 AM By: Allen Derry PA-C Entered By: Allen Derry on 09/11/2022 08:43:37 -------------------------------------------------------------------------------- HPI Details Patient Name: Date of Service: Marissa Deliah Goody Cardenas. 09/11/2022 8:45 A M Medical Record Number: [id number] Patient Account Number: [id number] Date of Birth/Sex: Treating RN: 10-05-1950 (72 y.o. F) Primary Care Provider: Jayme Cloud., [name] Other Clinician: Referring Provider: Treating Provider/Extender: Crista Curb in Treatment: 36 History of Present Illness HPI Description: 01-02-22 upon evaluation today patient presents for initial inspection here in our clinic concerning a wound that she has over the sacral region. This is stated to be present since around the beginning of August 2023. She currently resides in a assisted nursing facility. She does seem to be able to answer questions fully today. Subsequently I do note that she is a little on the thin side but again other than the protein calorie malnutrition she is minimally weak but still does get up and move around some but she also tells me that she "sits a lot". She has not had any x-rays of the sacral region at this point. 01-09-2022 upon evaluation today patient's wound in the sacral area actually appears to be doing  decently well. Fortunately I do not see any signs of infection which is great news and overall I am extremely pleased with where things stand today. 11/8; this is a patient who lives in some form of small assisted living in [place]. She has a deep wound over the lower part of her coccyx. An x-ray that we ordered from last time did not show any osseous abnormalities. We are supposed to be using Hydrofera Blue in the wound but I am really not sure what they are using to dress this and who is doing it. Talking to our staff they have apparently discussed this with the staff in the facility. She does not have home health. 01-23-2022 upon evaluation today patient appears to be doing decently well in regard to her wound. She has been tolerating the dressing changes without complication although I am not certain the dressing changes have been done appropriately over the past couple of weeks. We have been trying to get her to come in here we also try to get her into a wound care center in [place] which would be closer for the assisted living facility. Unfortunately neither 1 of those were undertaken up to this point. The patient did end up having some training of the staff from an RN that they brought him to have the staff perform the dressing changes but that being said it does not sound like this has been done correctly. Nonetheless I do believe that based on what we see it would benefit the patient to actually have her come here 3 times a week also think a wound VAC could potentially be a possibility for her which would help to get things moving in a much better direction as far as healing is concerned. We need to get this to fill-in though it looks clean and  does not look infected I do think that we need to really get this to fill-in and there is quite a bit of undermining that is to be considered here. 01-30-2022 upon evaluation today patient appears to be doing well currently in regard to her wound which  is looking pretty decent but still has quite a bit of space underneath as far as undermining is concerned. I think she might possibly be better with a wound VAC. With that being said if when I do this we probably only be able to do it 2 times a week at most. I discussed that with the patient today she is okay with that we just need to see if we can get the insurance approval and then of course the scheduling underway. 02-13-2022 upon evaluation today patient appears to be doing well currently in regard to her wound. She actually tells me that it is feeling a lot better which is good news. Fortunately I do not see any signs of active infection at this time. No fevers, chills, nausea, vomiting, or diarrhea. 02-27-2022 upon evaluation today patient appears to be doing well currently in regard to her wound. She is actually showing some signs of improvement we are FREDDA, GRAEBNER Cardenas ([id number]) [id number] Page 2 of 9 still obtaining the approval for the snap VAC which I think could be beneficial in the meantime we been using the Hydrofera Blue rope. 03-27-2022 upon evaluation today patient appears to be doing okay in regard to her wound but there was no dressing in place upon arrival today. Fortunately I see again no evidence of infection. No fevers, chills, nausea, vomiting, or diarrhea. 04-03-2022 upon evaluation today patient did not have a dressing on when she came into the office. With that being said she actually tells me that it was on until yesterday she took a shower and it came loose and then today came off completely. With that being said I really do not think she needs to be taking shower every single day or having to see her here in the clinic 3 times per week on Monday, Wednesday, and Friday and during those times obviously in between she needs to probably avoid showering. For that reason what I advised her to do would be to shower in the mornings on the days that she  comes to see [place] that way if the dressing does come off or start to come off we will be changing it anyway. She voiced understanding and tells me she can do that. Unfortunately the facility has nobody to help her with changing the dressings and we are not currently having to do that here in the clinic again. This means that during the time that she was not coming in from December 20 through when I saw her last week on the 17th there was a period of 2 weeks where the wound really was not changed at all as the RN that Baird Lyons tell me they had at the facility did not end up working out. This obviously is not good and is not what we expect to see as far as patient care is concerned which is why we are now seeing her 3 times a week here in the clinic. 04-10-2022 upon evaluation today patient appears to be doing well currently in regard to her wound which is actually showing signs of good granulation epithelization at this point. However she does have quite a bit of drainage and we are little concerned about the possibility of infection. For  that reason I think she could benefit from a PCR culture followed by [facility] topical antibiotics. I am also thinking of going ahead and doing gentamicin today as well. 2/7; small wound on the lower sacrum however with considerable degree of undermining from roughly 7-4. PCR culture that was done last week showed "no organisms". We have been using gentamicin and iodoform packing.She lives in some form of group home in [place] I believe. Uncertain about the adequacy of offloading this area 04-24-2022 upon evaluation patient's wound actually is showing signs of doing decently well I do believe she would benefit from a snap VAC and we discussed that again today. Fortunately I do not see any signs of active infection locally nor systemically which is great news. No fevers, chills, nausea, vomiting, or diarrhea. 05-01-2022 upon evaluation today patient's wound is really doing  about the same. Fortunately I do not see any signs of active infection locally nor systemically at this time which is great news with that being said we have gotten approval for the snap VAC and we will going to go ahead and place that today. 05-08-2022 upon evaluation today patient appears to be doing well currently in regard to her wound all things considered but were having a difficult time with a snap VAC suctioning appropriately. With that being said we will get a go ahead and likely avoid the snap VAC at this point at least until we can get the actual brace dressing as bridging ourselves does not seem to be working nearly as well. 3/6; I note the difficulty with a snap VAC which is disappointing but apparently another type of VAC is being considered. Also a referral to plastic surgery at [facility]. The patient has a very small but deep and at the base circumferential undermining. She is coming in here now 3 times a week for [facility] placement 3/13; patient presents for follow-up. She has been using Hydrofera Blue 3 times a week. She comes into our clinic for nurse visits to have this done. We are still awaiting a wound VAC. She has no issues or complaints today. 05-29-2022 upon evaluation today patient appears to be doing about the same in regard to her wound. She still has significant undermining although the opening is a huge the undermining is significant. I do believe that a gauze VAC would be beneficial here. The patient does not eat normally and in fact tells me that she eats very well. She also is doing supplements with Juven at this point. She also has a good ability to get up and move around she is completely ambulatory and does not just lay with pressure on this area. She also is able to roll and reposition in bed without complication. For that reason she really has no need for a group 2 mattress at this point whatsoever this would be a complete waste of resources as far as insurance  and medical equipment is concerned. As far as her urinary incontinence she has no issues with incontinence at this point she is able to carry herself to the bathroom when needed she does wear a depends just in case but again that is not a routinely utilized item. 06-05-2022 upon evaluation today patient appears to be doing decently well in regard to her wound I do not see any signs of infection overall she seems to be doing quite well. I do think that she is making good progress in general here. We do have the wound VAC however and I think  this is going to potentially help to get this. Hand has been the biggest issue we have had is getting this to actually feeling more effectively. 06-12-2022 upon evaluation today patient appears to be doing okay in regard to her wound. We are still seeing some signs of improvement. Fortunately there does not appear to be any evidence of active infection at this time which is good news she did see the doctors over at [facility] yesterday and they are ordering an MRI in preparation for considering possibility of a surgical closure. The patient voiced understanding she had forgotten we have made this referral. T be o honest I was back in February kind of forgot about the referral to in the interim trying to proceed with her wound care. Nonetheless I appreciate their be evaluation of this and again we will see what the MRI shows. 06-19-2022 upon evaluation today patient's wound is actually showing signs of good improvement and very pleased with where we stand today. Fortunately I do not see any evidence of infection locally nor systemically which is great news I think the wound VAC is doing a good job of undermining seems less than last week. 06-26-2022 upon evaluation today patient appears to be doing well with regard to her wound although her wound VAC it actually died she had forgotten to charge it overnight. With that being said she does seem to be doing decently well at  this point based on what I am seeing I think that the undermining is much less than what it was previous. Fortunately I do not see any signs of active infection locally nor systemically at this time. 07-03-2022 upon evaluation today patient appears to be doing decently well in regard to her wound. She actually did have an MRI performed and I did review that today as well. It shows that she actually does have a focal marrow signal abnormality in the left aspect of the sacrum at the S4 level with adjacent soft tissue swelling and presumed sacral decubitus ulcer concerning for a small area of osteomyelitis. Otherwise there did not appear to be any fluid collection which is good news there is no signs of an obvious abscess. 07-10-2022 upon evaluation today patient appears to be doing well currently in regard to her wound. She has been tolerating the dressing changes without complication. Fortunately there does not appear to be any signs of active infection which is good news. 07-17-2022 upon evaluation today patient unfortunately does not seem to be making progress with the gauze VAC. We have given her a good trial with this and unfortunately I do not think that we are seeing any improvement for that reason I think it would be best to send this back and not continue therapy with the gauze back at this point. The patient voiced understanding and she is in agreement with that plan. She does have an appointment with the plastic surgeon at [facility] it was supposed to be yesterday but apparently got moved next Tuesday, May 14. 07-31-2022 upon evaluation today patient appears to be doing well currently in regard to her wound all things considered. She does have osteomyelitis of the sacral area which is noted. With that being said I think that the [facility] Blue rope has done decently well but I do believe that she may benefit from switching to endoform and just packed this very well with endoform. As it starts to  dissolve it would not be able to be over packed. She is in agreement with giving  this a trial. 08-07-2022 upon evaluation today patient appears to be doing really about the same in regard to her wound. The endoform I think may be doing a little bit better than the [facility] but were still not seeing a lot of progress here. This is unfortunate as again we are trying to get this healed as quickly as possible. Nonetheless I do believe that she would benefit from a likely antibiotic course as well as potentially hyperbaric oxygen therapy. With that being said I discussed with the patient at this point that we need to likely see about getting the labs done as it does not look like they were ever done back in April at the end when I ordered this. They were supposed to be done but we never received any results. Nonetheless at this point I think we would need updated results anyway in order to determine where we would need to go currently. She voiced understanding as did the individual that was with her today. GARNER, ZEPPIERI ([id number]) [id number] Page 3 of 9 08-14-2022 upon evaluation today patient appears to be doing well currently in regard to her wound. This is any better but is also not any worse. We have been doing a good job keeping it under control. With that being said she did see Dr. Roxan Hockey at [facility] and they are actually proceed with a flap surgery. Again this I think is probably to be the primary best way to get this closed and she is in agreement with that plan she is actually very happy about the plan. 08-21-2022 upon evaluation patient appears to be doing about the same as compared to last week's evaluation. There is really no significant improvement overall in size or depth. She is still awaiting the appointment for the flap surgery. I did talk to the facility today and they are going to reach out to [facility] to try to figure out what the plan is as far as  getting her scheduled. 08-28-2022 upon evaluation today patient appears to be doing about the same in regard to her wound. The good news is she is not any worse the bad news is she is also not really significantly better but they are still waiting on the scheduling for the surgery at [facility]. That we will happen at some point right now it is in the insurance approval phase apparently based on what I am hearing. 09-04-2022 upon evaluation today patient presents for follow-up concerning her ongoing issues with the sacral wound. Was still waiting on the surgery to be scheduled in [facility] for the flap surgery. With that being said I do not see any evidence of active infection locally or systemically at this time. No fevers, chills, nausea, vomiting, or diarrhea. 09-11-2022 upon evaluation today patient appears to be doing about the same in regard to her wound. Fortunately there does not appear to be any signs of active infection at this time. No fevers, chills, nausea, vomiting, or diarrhea. Electronic Signature(s) Signed: 09/11/2022 2:58:45 PM By: Allen Derry PA-C Entered By: Allen Derry on 09/11/2022 14:58:45 -------------------------------------------------------------------------------- Physical Exam Details Patient Name: Date of Service: Marissa Deliah Goody Cardenas. 09/11/2022 8:45 A M Medical Record Number: [id number] Patient Account Number: [id number] Date of Birth/Sex: Treating RN: 15-Jul-1950 (72 y.o. F) Primary Care Provider: Jayme Cloud., [name] Other Clinician: Referring Provider: Treating Provider/Extender: Crista Curb in Treatment: 36 Constitutional Well-nourished and well-hydrated in no acute distress. Respiratory normal breathing without difficulty. Psychiatric this patient  is able to make decisions and demonstrates good insight into disease process. Alert and Oriented x 3. pleasant and cooperative. Notes Upon inspection patient's wound bed actually showed  signs of good granulation epithelization this really is not any smaller it is also not any worse I think she is doing quite well and in fact we will get a check on the surgery she supposed be having plastic surgery with a flap to get this closed and she has not been scheduled yet it has been several weeks. Electronic Signature(s) Signed: 09/11/2022 2:59:05 PM By: Allen Derry PA-C Entered By: Allen Derry on 09/11/2022 14:59:05 -------------------------------------------------------------------------------- Physician Orders Details Patient Name: Date of Service: Marissa Deliah Goody Cardenas. 09/11/2022 8:45 A M Medical Record Number: [id number] Patient Account Number: [id number] Date of Birth/Sex: Treating RN: 1950-05-17 (72 y.o. [name], [name] Primary Care Provider: Jayme Cloud., [name] Other Clinician: Referring Provider: Treating Provider/Extender: Crista Curb in Treatment: 36 Verbal / Phone Orders: No Diagnosis [address] AMANA, CALLANAN Cardenas ([id number]) [id number] Page 4 of 9 ICD-10 Coding Code Description L89.154 Pressure ulcer of sacral region, stage 4 M86.48 Chronic osteomyelitis with draining sinus, other site M62.81 Muscle weakness (generalized) E43 Unspecified severe protein-calorie malnutrition Follow-up Appointments ppointment in 1 week. Allen Derry III PA-C Room 7 Return A Nurse Visit: - Twice a week Mondays 0745 and Fridays 1130 Other: - will see you three times a week with dressing changes till you have your surgery with Dr. Roxan Hockey then he will take over wound care. Can cancel wound care appts when surgery is planned. Anesthetic (In clinic) Topical Lidocaine 4% applied to wound bed Negative Presssure Wound Therapy Discontinue wound vac. Call the number on the machine for the company to pick up. Off-Loading Turn and reposition every 2 hours - when sitting. walk hourly to aid in offloading pressure to buttock. Other: - PCP at  Wound Clinic talked with pateint regarding possible Hyperbaric Oxygen Therapy. (08/07/22) Wound Treatment Wound #1 - Sacrum Cleanser: Normal Saline 3 x Per Week/30 Days Discharge Instructions: Cleanse the wound with Normal Saline prior to applying a clean dressing using gauze sponges, not tissue or cotton balls. Cleanser: Vashe 5.8 (oz) 3 x Per Week/30 Days Discharge Instructions: Cleanse the wound with Vashe prior to applying a clean dressing using gauze sponges, not tissue or cotton balls. Peri-Wound Care: Skin Prep 3 x Per Week/30 Days Discharge Instructions: Use skin prep as directed Prim Dressing: Endoform 2x2 in 3 x Per Week/30 Days ary Discharge Instructions: Moisten with saline Secondary Dressing: Woven Gauze Sponge, Non-Sterile 4x4 in 3 x Per Week/30 Days Discharge Instructions: Apply over primary dressing as directed. Secondary Dressing: Zetuvit Plus Silicone Border Dressing 7x7(in/in) 3 x Per Week/30 Days Discharge Instructions: Apply silicone border over primary dressing as directed. Electronic Signature(s) Signed: 09/11/2022 3:56:42 PM By: Allen Derry PA-C Signed: 09/11/2022 4:48:32 PM By: Shawn Stall RN, BSN Entered By: Shawn Stall on 09/11/2022 08:45:58 -------------------------------------------------------------------------------- Problem List Details Patient Name: Date of Service: Marissa Deliah Goody Cardenas. 09/11/2022 8:45 A M Medical Record Number: [id number] Patient Account Number: [id number] Date of Birth/Sex: Treating RN: 10/03/1950 (72 y.o. F) Primary Care Provider: Jayme Cloud., [name] Other Clinician: Referring Provider: Treating Provider/Extender: Crista Curb in Treatment: 36 Active Problems ICD-10 Encounter Code Description Active Date MDM Diagnosis L89.154 Pressure ulcer of sacral region, stage 4 01/02/2022 No Yes [name], [name] ([id number]) [id number] Page 5 of 9 M86.48 Chronic osteomyelitis with  draining sinus, other site  07/31/2022 No Yes M62.81 Muscle weakness (generalized) 01/02/2022 No Yes E43 Unspecified severe protein-calorie malnutrition 01/02/2022 No Yes Inactive Problems Resolved Problems Electronic Signature(s) Signed: 09/11/2022 8:43:32 AM By: Allen Derry PA-C Entered By: Allen Derry on 09/11/2022 08:43:32 -------------------------------------------------------------------------------- Progress Note Details Patient Name: Date of Service: Marissa Deliah Goody Cardenas. 09/11/2022 8:45 A M Medical Record Number: [id number] Patient Account Number: [id number] Date of Birth/Sex: Treating RN: Jul 10, 1950 (72 y.o. F) Primary Care Provider: Jayme Cloud., [name] Other Clinician: Referring Provider: Treating Provider/Extender: Crista Curb in Treatment: 36 Subjective Chief Complaint Information obtained from Patient Pressure ulcer stage 3 Sacrum History of Present Illness (HPI) 01-02-22 upon evaluation today patient presents for initial inspection here in our clinic concerning a wound that she has over the sacral region. This is stated to be present since around the beginning of August 2023. She currently resides in a assisted nursing facility. She does seem to be able to answer questions fully today. Subsequently I do note that she is a little on the thin side but again other than the protein calorie malnutrition she is minimally weak but still does get up and move around some but she also tells me that she "sits a lot". She has not had any x-rays of the sacral region at this point. 01-09-2022 upon evaluation today patient's wound in the sacral area actually appears to be doing decently well. Fortunately I do not see any signs of infection which is great news and overall I am extremely pleased with where things stand today. 11/8; this is a patient who lives in some form of small assisted living in [place]. She has a deep wound over the lower part of her coccyx. An  x-ray that we ordered from last time did not show any osseous abnormalities. We are supposed to be using Hydrofera Blue in the wound but I am really not sure what they are using to dress this and who is doing it. Talking to our staff they have apparently discussed this with the staff in the facility. She does not have home health. 01-23-2022 upon evaluation today patient appears to be doing decently well in regard to her wound. She has been tolerating the dressing changes without complication although I am not certain the dressing changes have been done appropriately over the past couple of weeks. We have been trying to get her to come in here we also try to get her into a wound care center in [place] which would be closer for the assisted living facility. Unfortunately neither 1 of those were undertaken up to this point. The patient did end up having some training of the staff from an RN that they brought him to have the staff perform the dressing changes but that being said it does not sound like this has been done correctly. Nonetheless I do believe that based on what we see it would benefit the patient to actually have her come here 3 times a week also think a wound VAC could potentially be a possibility for her which would help to get things moving in a much better direction as far as healing is concerned. We need to get this to fill-in though it looks clean and does not look infected I do think that we need to really get this to fill-in and there is quite a bit of undermining that is to be considered here. 01-30-2022 upon evaluation today patient appears to be doing well currently in regard to her wound which  is looking pretty decent but still has quite a bit of space underneath as far as undermining is concerned. I think she might possibly be better with a wound VAC. With that being said if when I do this we probably only be able to do it 2 times a week at most. I discussed that with the patient  today she is okay with that we just need to see if we can get the insurance approval and then of course the scheduling underway. 02-13-2022 upon evaluation today patient appears to be doing well currently in regard to her wound. She actually tells me that it is feeling a lot better which is good news. Fortunately I do not see any signs of active infection at this time. No fevers, chills, nausea, vomiting, or diarrhea. 02-27-2022 upon evaluation today patient appears to be doing well currently in regard to her wound. She is actually showing some signs of improvement we are still obtaining the approval for the snap VAC which I think could be beneficial in the meantime we been using the Hydrofera Blue rope. [name], [name] ([id number]) [id number] Page 6 of 9 03-27-2022 upon evaluation today patient appears to be doing okay in regard to her wound but there was no dressing in place upon arrival today. Fortunately I see again no evidence of infection. No fevers, chills, nausea, vomiting, or diarrhea. 04-03-2022 upon evaluation today patient did not have a dressing on when she came into the office. With that being said she actually tells me that it was on until yesterday she took a shower and it came loose and then today came off completely. With that being said I really do not think she needs to be taking shower every single day or having to see her here in the clinic 3 times per week on Monday, Wednesday, and Friday and during those times obviously in between she needs to probably avoid showering. For that reason what I advised her to do would be to shower in the mornings on the days that she comes to see [place] that way if the dressing does come off or start to come off we will be changing it anyway. She voiced understanding and tells me she can do that. Unfortunately the facility has nobody to help her with changing the dressings and we are not currently having to do that here in  the clinic again. This means that during the time that she was not coming in from December 20 through when I saw her last week on the 17th there was a period of 2 weeks where the wound really was not changed at all as the RN that Baird Lyons tell me they had at the facility did not end up working out. This obviously is not good and is not what we expect to see as far as patient care is concerned which is why we are now seeing her 3 times a week here in the clinic. 04-10-2022 upon evaluation today patient appears to be doing well currently in regard to her wound which is actually showing signs of good granulation epithelization at this point. However she does have quite a bit of drainage and we are little concerned about the possibility of infection. For that reason I think she could benefit from a PCR culture followed by [facility] topical antibiotics. I am also thinking of going ahead and doing gentamicin today as well. 2/7; small wound on the lower sacrum however with considerable degree of undermining from roughly 7-4. PCR culture  that was done last week showed "no organisms". We have been using gentamicin and iodoform packing.She lives in some form of group home in [place] I believe. Uncertain about the adequacy of offloading this area 04-24-2022 upon evaluation patient's wound actually is showing signs of doing decently well I do believe she would benefit from a snap VAC and we discussed that again today. Fortunately I do not see any signs of active infection locally nor systemically which is great news. No fevers, chills, nausea, vomiting, or diarrhea. 05-01-2022 upon evaluation today patient's wound is really doing about the same. Fortunately I do not see any signs of active infection locally nor systemically at this time which is great news with that being said we have gotten approval for the snap VAC and we will going to go ahead and place that today. 05-08-2022 upon evaluation today patient appears  to be doing well currently in regard to her wound all things considered but were having a difficult time with a snap VAC suctioning appropriately. With that being said we will get a go ahead and likely avoid the snap VAC at this point at least until we can get the actual brace dressing as bridging ourselves does not seem to be working nearly as well. 3/6; I note the difficulty with a snap VAC which is disappointing but apparently another type of VAC is being considered. Also a referral to plastic surgery at [facility]. The patient has a very small but deep and at the base circumferential undermining. She is coming in here now 3 times a week for [facility] placement 3/13; patient presents for follow-up. She has been using Hydrofera Blue 3 times a week. She comes into our clinic for nurse visits to have this done. We are still awaiting a wound VAC. She has no issues or complaints today. 05-29-2022 upon evaluation today patient appears to be doing about the same in regard to her wound. She still has significant undermining although the opening is a huge the undermining is significant. I do believe that a gauze VAC would be beneficial here. The patient does not eat normally and in fact tells me that she eats very well. She also is doing supplements with Juven at this point. She also has a good ability to get up and move around she is completely ambulatory and does not just lay with pressure on this area. She also is able to roll and reposition in bed without complication. For that reason she really has no need for a group 2 mattress at this point whatsoever this would be a complete waste of resources as far as insurance and medical equipment is concerned. As far as her urinary incontinence she has no issues with incontinence at this point she is able to carry herself to the bathroom when needed she does wear a depends just in case but again that is not a routinely utilized item. 06-05-2022 upon evaluation  today patient appears to be doing decently well in regard to her wound I do not see any signs of infection overall she seems to be doing quite well. I do think that she is making good progress in general here. We do have the wound VAC however and I think this is going to potentially help to get this. Hand has been the biggest issue we have had is getting this to actually feeling more effectively. 06-12-2022 upon evaluation today patient appears to be doing okay in regard to her wound. We are still seeing some signs  of improvement. Fortunately there does not appear to be any evidence of active infection at this time which is good news she did see the doctors over at [facility] yesterday and they are ordering an MRI in preparation for considering possibility of a surgical closure. The patient voiced understanding she had forgotten we have made this referral. T be o honest I was back in February kind of forgot about the referral to in the interim trying to proceed with her wound care. Nonetheless I appreciate their be evaluation of this and again we will see what the MRI shows. 06-19-2022 upon evaluation today patient's wound is actually showing signs of good improvement and very pleased with where we stand today. Fortunately I do not see any evidence of infection locally nor systemically which is great news I think the wound VAC is doing a good job of undermining seems less than last week. 06-26-2022 upon evaluation today patient appears to be doing well with regard to her wound although her wound VAC it actually died she had forgotten to charge it overnight. With that being said she does seem to be doing decently well at this point based on what I am seeing I think that the undermining is much less than what it was previous. Fortunately I do not see any signs of active infection locally nor systemically at this time. 07-03-2022 upon evaluation today patient appears to be doing decently well in regard to her  wound. She actually did have an MRI performed and I did review that today as well. It shows that she actually does have a focal marrow signal abnormality in the left aspect of the sacrum at the S4 level with adjacent soft tissue swelling and presumed sacral decubitus ulcer concerning for a small area of osteomyelitis. Otherwise there did not appear to be any fluid collection which is good news there is no signs of an obvious abscess. 07-10-2022 upon evaluation today patient appears to be doing well currently in regard to her wound. She has been tolerating the dressing changes without complication. Fortunately there does not appear to be any signs of active infection which is good news. 07-17-2022 upon evaluation today patient unfortunately does not seem to be making progress with the gauze VAC. We have given her a good trial with this and unfortunately I do not think that we are seeing any improvement for that reason I think it would be best to send this back and not continue therapy with the gauze back at this point. The patient voiced understanding and she is in agreement with that plan. She does have an appointment with the plastic surgeon at [facility] it was supposed to be yesterday but apparently got moved next Tuesday, May 14. 07-31-2022 upon evaluation today patient appears to be doing well currently in regard to her wound all things considered. She does have osteomyelitis of the sacral area which is noted. With that being said I think that the [facility] Blue rope has done decently well but I do believe that she may benefit from switching to endoform and just packed this very well with endoform. As it starts to dissolve it would not be able to be over packed. She is in agreement with giving this a trial. 08-07-2022 upon evaluation today patient appears to be doing really about the same in regard to her wound. The endoform I think may be doing a little bit better than the [facility] but were still  not seeing a lot of progress here. This  is unfortunate as again we are trying to get this healed as quickly as possible. Nonetheless I do believe that she would benefit from a likely antibiotic course as well as potentially hyperbaric oxygen therapy. With that being said I discussed with the patient at this point that we need to likely see about getting the labs done as it does not look like they were ever done back in April at the end when I ordered this. They were supposed to be done but we never received any results. Nonetheless at this point I think we would need updated results anyway in order to determine where we would need to go currently. She voiced understanding as did the individual that was with her today. 08-14-2022 upon evaluation today patient appears to be doing well currently in regard to her wound. This is any better but is also not any worse. We have been doing a good job keeping it under control. With that being said she did see Dr. Roxan Hockey at [facility] and they are actually proceed with a flap surgery. [name], [name] ([id number]) [id number] Page 7 of 9 Again this I think is probably to be the primary best way to get this closed and she is in agreement with that plan she is actually very happy about the plan. 08-21-2022 upon evaluation patient appears to be doing about the same as compared to last week's evaluation. There is really no significant improvement overall in size or depth. She is still awaiting the appointment for the flap surgery. I did talk to the facility today and they are going to reach out to [facility] to try to figure out what the plan is as far as getting her scheduled. 08-28-2022 upon evaluation today patient appears to be doing about the same in regard to her wound. The good news is she is not any worse the bad news is she is also not really significantly better but they are still waiting on the scheduling for the surgery at [facility].  That we will happen at some point right now it is in the insurance approval phase apparently based on what I am hearing. 09-04-2022 upon evaluation today patient presents for follow-up concerning her ongoing issues with the sacral wound. Was still waiting on the surgery to be scheduled in [facility] for the flap surgery. With that being said I do not see any evidence of active infection locally or systemically at this time. No fevers, chills, nausea, vomiting, or diarrhea. 09-11-2022 upon evaluation today patient appears to be doing about the same in regard to her wound. Fortunately there does not appear to be any signs of active infection at this time. No fevers, chills, nausea, vomiting, or diarrhea. Objective Constitutional Well-nourished and well-hydrated in no acute distress. Vitals Time Taken: 8:35 AM, Height: 64 in, Weight: 97 lbs, BMI: 16.6, Pulse: 67 bpm, Respiratory Rate: 20 breaths/min, Blood Pressure: 128/65 mmHg. Respiratory normal breathing without difficulty. Psychiatric this patient is able to make decisions and demonstrates good insight into disease process. Alert and Oriented x 3. pleasant and cooperative. General Notes: Upon inspection patient's wound bed actually showed signs of good granulation epithelization this really is not any smaller it is also not any worse I think she is doing quite well and in fact we will get a check on the surgery she supposed be having plastic surgery with a flap to get this closed and she has not been scheduled yet it has been several weeks. Integumentary (Hair, Skin) Wound #1 status is  Open. Original cause of wound was Pressure Injury. The date acquired was: 10/09/2021. The wound has been in treatment 36 weeks. The wound is located on the Sacrum. The wound measures 0.4cm length x 0.3cm width x 0.8cm depth; 0.094cm^2 area and 0.075cm^3 volume. There is no tunneling noted, however, there is undermining starting at 12:00 and ending at 9:00 with a  maximum distance of 2.5cm. There is a medium amount of serosanguineous drainage noted. The wound margin is distinct with the outline attached to the wound base. There is no granulation within the wound bed. There is no necrotic tissue within the wound bed. The periwound skin appearance did not exhibit: Callus, Crepitus, Excoriation, Induration, Rash, Scarring, Dry/Scaly, Maceration, Atrophie Blanche, Cyanosis, Ecchymosis, Hemosiderin Staining, Mottled, Pallor, Rubor, Erythema. 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 Allen Derry III PA-C Room 7 Nurse Visit: - Twice a week Mondays 0745 and Fridays 1130 Other: - will see you three times a week with dressing changes till you have your surgery with Dr. Roxan Hockey then he will take over wound care. Can cancel wound care appts when surgery is planned. Anesthetic: (In clinic) Topical Lidocaine 4% applied to wound bed Negative Presssure Wound Therapy: Discontinue wound vac. Call the number on the machine for the company to pick up. Off-Loading: Turn and reposition every 2 hours - when sitting. walk hourly to aid in offloading pressure to buttock. Other: - PCP at [facility] Clinic talked with pateint regarding possible Hyperbaric Oxygen Therapy. (08/07/22) WOUND #1: - Sacrum Wound Laterality: Cleanser: Normal Saline 3 x Per Week/30 Days Discharge Instructions: Cleanse the wound with Normal Saline prior to applying a clean dressing using gauze sponges, not tissue or cotton balls. Cleanser: Vashe 5.8 (oz) 3 x Per Week/30 Days [name], [name] ([id number]) [id number] Page 8 of 9 Discharge Instructions: Cleanse the wound with Vashe prior to applying a clean dressing using gauze sponges, not tissue or cotton  balls. Peri-Wound Care: Skin Prep 3 x Per Week/30 Days Discharge Instructions: Use skin prep as directed Prim Dressing: Endoform 2x2 in 3 x Per Week/30 Days ary Discharge Instructions: Moisten with saline Secondary Dressing: Woven Gauze Sponge, Non-Sterile 4x4 in 3 x Per Week/30 Days Discharge Instructions: Apply over primary dressing as directed. Secondary Dressing: Zetuvit Plus Silicone Border Dressing 7x7(in/in) 3 x Per Week/30 Days Discharge Instructions: Apply silicone border over primary dressing as directed. 1. I would recommend currently based on what we are seeing that we go ahead and see about getting the patient back in for plastic surgery which she has not heard anything from them working to follow-up on that with a phone call today try to see what we can figure out in that regard. 2 I am the recommend as well that we have the patient continue with endoform followed by the Zetuvit to cover this is in the interim until she gets in with plastic surgery. We will see patient back for reevaluation in 1 week here in the clinic. If anything worsens or changes patient will contact our office for additional recommendations. Addendum: We did contact [facility] and fortunately were able to identify the fact that she is actually already been approved for the surgery and it sounds like they had attempted to schedule not exactly sure what happened anyway they are going to get her on the schedule and it sounds like this  is going to be for July 15. Electronic Signature(s) Signed: 09/11/2022 3:00:45 PM By: Allen Derry PA-C Entered By: Allen Derry on 09/11/2022 15:00:45 -------------------------------------------------------------------------------- SuperBill Details Patient Name: Date of Service: Marissa Deliah Goody Cardenas. 09/11/2022 Medical Record Number: [id number] Patient Account Number: [id number] Date of Birth/Sex: Treating RN: [birth year] (72 y.o. [name], [name] Primary Care Provider: Jayme Cloud.,  [name] Other Clinician: Referring Provider: Treating Provider/Extender: Crista Curb in Treatment: 36 Diagnosis Coding ICD-10 Codes Code Description [phone] Pressure ulcer of sacral region, stage 4 M86.48 Chronic osteomyelitis with draining sinus, other site M62.81 Muscle weakness (generalized) E43 Unspecified severe protein-calorie malnutrition Facility Procedures : CPT4 Code: [id number] Description: 99213 - WOUND CARE VISIT-LEV 3 EST PT Modifier: Quantity: 1 Physician Procedures : CPT4 Code Description Modifier [id number] 99213 - WC PHYS LEVEL 3 - EST PT ICD-10 Diagnosis Description L89.154 Pressure ulcer of sacral region, stage 4 M86.48 Chronic osteomyelitis with draining sinus, other site M62.81 Muscle weakness (generalized) E43  Unspecified severe protein-calorie malnutrition Quantity: 1 Electronic Signature(s) Signed: 09/11/2022 3:00:59 PM By: Wayland Salinas, Ninetta Lights Cardenas ([id number]) [id number] Page 9 of 9 Entered By: Allen Derry on 09/11/2022 15:00:59

## 2022-09-13 ENCOUNTER — Encounter (HOSPITAL_BASED_OUTPATIENT_CLINIC_OR_DEPARTMENT_OTHER): Payer: 59 | Admitting: Internal Medicine

## 2022-09-13 DIAGNOSIS — F17210 Nicotine dependence, cigarettes, uncomplicated: Secondary | ICD-10-CM | POA: Diagnosis not present

## 2022-09-13 DIAGNOSIS — K861 Other chronic pancreatitis: Secondary | ICD-10-CM | POA: Diagnosis not present

## 2022-09-13 DIAGNOSIS — L89153 Pressure ulcer of sacral region, stage 3: Secondary | ICD-10-CM | POA: Diagnosis not present

## 2022-09-13 DIAGNOSIS — J449 Chronic obstructive pulmonary disease, unspecified: Secondary | ICD-10-CM | POA: Diagnosis not present

## 2022-09-13 DIAGNOSIS — I639 Cerebral infarction, unspecified: Secondary | ICD-10-CM | POA: Diagnosis not present

## 2022-09-13 DIAGNOSIS — E2740 Unspecified adrenocortical insufficiency: Secondary | ICD-10-CM | POA: Diagnosis not present

## 2022-09-13 DIAGNOSIS — L89154 Pressure ulcer of sacral region, stage 4: Secondary | ICD-10-CM | POA: Diagnosis not present

## 2022-09-13 DIAGNOSIS — Z681 Body mass index (BMI) 19 or less, adult: Secondary | ICD-10-CM | POA: Diagnosis not present

## 2022-09-13 DIAGNOSIS — G4700 Insomnia, unspecified: Secondary | ICD-10-CM | POA: Diagnosis not present

## 2022-09-13 DIAGNOSIS — M8648 Chronic osteomyelitis with draining sinus, other site: Secondary | ICD-10-CM | POA: Diagnosis not present

## 2022-09-13 DIAGNOSIS — M6281 Muscle weakness (generalized): Secondary | ICD-10-CM | POA: Diagnosis not present

## 2022-09-13 DIAGNOSIS — R030 Elevated blood-pressure reading, without diagnosis of hypertension: Secondary | ICD-10-CM | POA: Diagnosis not present

## 2022-09-13 DIAGNOSIS — E43 Unspecified severe protein-calorie malnutrition: Secondary | ICD-10-CM | POA: Diagnosis not present

## 2022-09-13 NOTE — Progress Notes (Signed)
GILBERT, BITTENBENDER ([id number]) [id number] Page 1 of 4 Visit Report for 09/13/2022 Arrival Information Details Patient Name: Date of Service: [name] 09/13/2022 11:30 A M Medical Record Number: [id number] Patient Account Number: [id number] Date of Birth/Sex: Treating RN: 1950-03-23 (72 y.o. [name], [name] Primary Care Kizzi Overbey: Jayme Cloud., [name] Other Clinician: Referring Harman Langhans: Treating Larysa Pall/Extender: Crissie Figures in Treatment: 36 Visit Information History Since Last Visit Added or deleted any medications: No Patient Arrived: Ambulatory Any new allergies or adverse reactions: No Arrival Time: 11:15 Had a fall or experienced change in No Accompanied By: self activities of daily living that may affect Transfer Assistance: None risk of falls: Patient Identification Verified: Yes Signs or symptoms of abuse/neglect since last visito No Secondary Verification Process Completed: Yes Hospitalized since last visit: No Patient Requires Transmission-Based Precautions: No Implantable device outside of the clinic excluding No Patient Has Alerts: No cellular tissue based products placed in the center since last visit: Has Dressing in Place as Prescribed: Yes Pain Present Now: No Electronic Signature(s) Signed: 09/13/2022 3:32:15 PM By: Shawn Stall RN, BSN Entered By: Shawn Stall on 09/13/2022 11:22:59 -------------------------------------------------------------------------------- Clinic Level of Care Assessment Details Patient Name: Date of Service: [name] 09/13/2022 11:30 A M Medical Record Number: [id number] Patient Account Number: [id number] Date of Birth/Sex: Treating RN: [birth year] (72 y.o. [name], [name] Primary Care Violette Morneault: Jayme Cloud., [name] Other Clinician: Referring Kharizma Lesnick: Treating Archie Shea/Extender: Crissie Figures in Treatment: 36 Clinic Level of Care  Assessment Items TOOL 4 Quantity Score X- 1 0 Use when only an EandM is performed on FOLLOW-UP visit ASSESSMENTS - Nursing Assessment / Reassessment X- 1 10 Reassessment of Co-morbidities (includes updates in patient status) X- 1 5 Reassessment of Adherence to Treatment Plan ASSESSMENTS - Wound and Skin A ssessment / Reassessment X - Simple Wound Assessment / Reassessment - one wound 1 5 []  - 0 Complex Wound Assessment / Reassessment - multiple wounds []  - 0 Dermatologic / Skin Assessment (not related to wound area) ASSESSMENTS - Focused Assessment []  - 0 Circumferential Edema Measurements - multi extremities []  - 0 Nutritional Assessment / Counseling / Intervention CATALEAH, BERON ([id number]) [id number] Page 2 of 4 []  - 0 Lower Extremity Assessment (monofilament, tuning fork, pulses) []  - 0 Peripheral Arterial Disease Assessment (using hand held doppler) ASSESSMENTS - Ostomy and/or Continence Assessment and Care []  - 0 Incontinence Assessment and Management []  - 0 Ostomy Care Assessment and Management (repouching, etc.) PROCESS - Coordination of Care X - Simple Patient / Family Education for ongoing care 1 15 []  - 0 Complex (extensive) Patient / Family Education for ongoing care X- 1 10 Staff obtains Chiropractor, Records, T Results / Process Orders est []  - 0 Staff telephones HHA, Nursing Homes / Clarify orders / etc X- 1 10 Routine Transfer to another Facility (non-emergent condition) []  - 0 Routine Hospital Admission (non-emergent condition) []  - 0 New Admissions / Manufacturing engineer / Ordering NPWT Apligraf, etc. , []  - 0 Emergency Hospital Admission (emergent condition) X- 1 10 Simple Discharge Coordination []  - 0 Complex (extensive) Discharge Coordination PROCESS - Special Needs []  - 0 Pediatric / Minor Patient Management []  - 0 Isolation Patient Management []  - 0 Hearing / Language / Visual special needs []  -  0 Assessment of Community assistance (transportation, D/C planning, etc.) []  - 0 Additional assistance / Altered mentation []  - 0 Support Surface(s) Assessment (bed, cushion, seat, etc.) INTERVENTIONS - Wound  Cleansing / Measurement X - Simple Wound Cleansing - one wound 1 5 []  - 0 Complex Wound Cleansing - multiple wounds []  - 0 Wound Imaging (photographs - any number of wounds) []  - 0 Wound Tracing (instead of photographs) []  - 0 Simple Wound Measurement - one wound []  - 0 Complex Wound Measurement - multiple wounds INTERVENTIONS - Wound Dressings X - Small Wound Dressing one or multiple wounds 1 10 []  - 0 Medium Wound Dressing one or multiple wounds []  - 0 Large Wound Dressing one or multiple wounds []  - 0 Application of Medications - topical []  - 0 Application of Medications - injection INTERVENTIONS - Miscellaneous []  - 0 External ear exam []  - 0 Specimen Collection (cultures, biopsies, blood, body fluids, etc.) []  - 0 Specimen(s) / Culture(s) sent or taken to Lab for analysis []  - 0 Patient Transfer (multiple staff / Nurse, adult / Similar devices) []  - 0 Simple Staple / Suture removal (25 or less) []  - 0 Complex Staple / Suture removal (26 or more) []  - 0 Hypo / Hyperglycemic Management (close monitor of Blood Glucose) JAKAILAH, LORINCZ R ([id number]) [id number] Page 3 of 4 []  - 0 Ankle / Brachial Index (ABI) - do not check if billed separately []  - 0 Vital Signs Has the patient been seen at the hospital within the last three years: Yes Total Score: 80 Level Of Care: New/Established - Level 3 Electronic Signature(s) Signed: 09/13/2022 3:32:15 PM By: Shawn Stall RN, BSN Entered By: Shawn Stall on 09/13/2022 11:23:54 -------------------------------------------------------------------------------- Encounter Discharge Information Details Patient Name: Date of Service: [name] 09/13/2022 11:30 A M Medical Record Number:  [id number] Patient Account Number: [id number] Date of Birth/Sex: Treating RN: 07-30-1950 (72 y.o. [name], [name] Primary Care Allyson Tineo: Jayme Cloud., [name] Other Clinician: Referring Kregg Cihlar: Treating Milka Windholz/Extender: Crissie Figures in Treatment: 36 Encounter Discharge Information Items Discharge Condition: Stable Ambulatory Status: Ambulatory Discharge Destination: Home Transportation: Private Auto Accompanied By: self Schedule Follow-up Appointment: Yes Clinical Summary of Care: Electronic Signature(s) Signed: 09/13/2022 3:32:15 PM By: Shawn Stall RN, BSN Entered By: Shawn Stall on 09/13/2022 11:23:30 -------------------------------------------------------------------------------- Wound Assessment Details Patient Name: Date of Service: [name] 09/13/2022 11:30 A M Medical Record Number: [id number] Patient Account Number: [id number] Date of Birth/Sex: Treating RN: May 16, 1950 (72 y.o. [name], [name] Primary Care Coleta Grosshans: Jayme Cloud., [name] Other Clinician: Referring Helix Lafontaine: Treating Teruo Stilley/Extender: Crissie Figures in Treatment: 36 Wound Status Wound Number: 1 Primary Etiology: Pressure Ulcer Wound Location: Sacrum Wound Status: Open Wounding Event: Pressure Injury Date Acquired: 10/09/2021 Weeks Of Treatment: 36 Clustered Wound: No Wound Measurements Length: (cm) 0.4 Width: (cm) 0.3 Depth: (cm) 0.8 Area: (cm) 0.094 Volume: (cm) 0.075 % Reduction in Area: 73.4% % Reduction in Volume: 69.6% Wound Description DERYKAH, LANZI R ([id number]) Classification: Category/Stage IV Exudate Amount: Medium Exudate Type: Serosanguineous Exudate Color: red, brown [id number] Page 4 of 4 Periwound Skin Texture Texture Color No Abnormalities Noted: No No Abnormalities Noted: No Moisture No Abnormalities Noted: No Electronic Signature(s) Signed: 09/13/2022 3:32:15 PM By: Shawn Stall RN, BSN Entered By: Shawn Stall on 09/13/2022 11:23:06

## 2022-09-13 NOTE — Progress Notes (Signed)
KIMERLY, DUMARS ([id number]) [id number] Page 1 of 1 Visit Report for 09/13/2022 SuperBill Details Patient Name: Date of Service: [name] 09/13/2022 Medical Record Number: [id number] Patient Account Number: [id number] Date of Birth/Sex: Treating RN: [birth year] (72 y.o. [name], [name] Primary Care Provider: Jayme Cloud., [name] Other Clinician: Referring Provider: Treating Provider/Extender: Crissie Figures in Treatment: 36 Diagnosis Coding ICD-10 Codes Code Description [phone] Pressure ulcer of sacral region, stage 4 M86.48 Chronic osteomyelitis with draining sinus, other site M62.81 Muscle weakness (generalized) E43 Unspecified severe protein-calorie malnutrition Facility Procedures CPT4 Code Description Modifier Quantity [id number] - WOUND CARE VISIT-LEV 3 EST PT 1 Electronic Signature(s) Signed: 09/13/2022 12:48:40 PM By: Geralyn Corwin DO Signed: 09/13/2022 3:32:15 PM By: Shawn Stall RN, BSN Entered By: Shawn Stall on 09/13/2022 11:23:58

## 2022-09-16 ENCOUNTER — Encounter (HOSPITAL_BASED_OUTPATIENT_CLINIC_OR_DEPARTMENT_OTHER): Payer: 59 | Admitting: Internal Medicine

## 2022-09-16 DIAGNOSIS — M8648 Chronic osteomyelitis with draining sinus, other site: Secondary | ICD-10-CM | POA: Diagnosis not present

## 2022-09-16 DIAGNOSIS — E43 Unspecified severe protein-calorie malnutrition: Secondary | ICD-10-CM | POA: Diagnosis not present

## 2022-09-16 DIAGNOSIS — L89153 Pressure ulcer of sacral region, stage 3: Secondary | ICD-10-CM | POA: Diagnosis not present

## 2022-09-16 DIAGNOSIS — L89154 Pressure ulcer of sacral region, stage 4: Secondary | ICD-10-CM | POA: Diagnosis not present

## 2022-09-16 DIAGNOSIS — M6281 Muscle weakness (generalized): Secondary | ICD-10-CM | POA: Diagnosis not present

## 2022-09-16 DIAGNOSIS — Z681 Body mass index (BMI) 19 or less, adult: Secondary | ICD-10-CM | POA: Diagnosis not present

## 2022-09-17 NOTE — Progress Notes (Signed)
CHRISTEL, BAI R ([id number]) [id number] Page 1 of 1 Visit Report for 09/16/2022 SuperBill Details Patient Name: Date of Service: [name] 09/16/2022 Medical Record Number: [id number] Patient Account Number: [id number] Date of Birth/Sex: Treating RN: 03-23-1950 (72 y.o. F) Primary Care Provider: Jayme Cloud., [name] Other Clinician: Referring Provider: Treating Provider/Extender: Crissie Figures in Treatment: 36 Diagnosis Coding ICD-10 Codes Code Description [phone] Pressure ulcer of sacral region, stage 4 M86.48 Chronic osteomyelitis with draining sinus, other site M62.81 Muscle weakness (generalized) E43 Unspecified severe protein-calorie malnutrition Facility Procedures CPT4 Code Description Modifier Quantity [id number] [phone] - WOUND CARE VISIT-LEV 2 EST PT 1 Electronic Signature(s) Signed: 09/16/2022 4:36:37 PM By: Geralyn Corwin DO Signed: 09/17/2022 4:04:59 PM By: Thayer Dallas Entered By: Thayer Dallas on 09/16/2022 16:13:46

## 2022-09-17 NOTE — Progress Notes (Signed)
Marissa, WERDER Cardenas ([id number]) [id number] Page 1 of 5 Visit Report for 09/16/2022 Arrival Information Details Patient Name: Date of Service: [name] 09/16/2022 3:30 PM Medical Record Number: [id number] Patient Account Number: [id number] Date of Birth/Sex: Treating RN: [birth year] (72 y.o. F) Primary Care Quanita Barona: Jayme Cloud., [name] Other Clinician: Referring Chemeka Filice: Treating Jisell Majer/Extender: Crissie Figures in Treatment: 36 Visit Information History Since Last Visit Added or deleted any medications: No Patient Arrived: Ambulatory Any new allergies or adverse reactions: No Arrival Time: 15:55 Had a fall or experienced change in No Accompanied By: self activities of daily living that may affect Transfer Assistance: None risk of falls: Patient Identification Verified: Yes Signs or symptoms of abuse/neglect since last visito No Secondary Verification Process Completed: Yes Hospitalized since last visit: No Patient Requires Transmission-Based Precautions: No Implantable device outside of the clinic excluding No Patient Has Alerts: No cellular tissue based products placed in the center since last visit: Has Dressing in Place as Prescribed: Yes Pain Present Now: No Electronic Signature(s) Signed: 09/17/2022 4:04:59 PM By: Thayer Dallas Entered By: Thayer Dallas on 09/16/2022 16:02:55 -------------------------------------------------------------------------------- Clinic Level of Care Assessment Details Patient Name: Date of Service: [name] 09/16/2022 3:30 PM Medical Record Number: [id number] Patient Account Number: [id number] Date of Birth/Sex: Treating RN: Jun 17, 1950 (72 y.o. F) Primary Care Loc [facility]: Jayme Cloud., [name] Other Clinician: Referring Quinnten Calvin: Treating Daleon Willinger/Extender: Crissie Figures in Treatment: 36 Clinic Level of Care Assessment Items TOOL 4 Quantity  Score X- 1 0 Use when only an EandM is performed on FOLLOW-UP visit ASSESSMENTS - Nursing Assessment / Reassessment X- 1 10 Reassessment of Co-morbidities (includes updates in patient status) X- 1 5 Reassessment of Adherence to Treatment Plan ASSESSMENTS - Wound and Skin A ssessment / Reassessment []  - 0 Simple Wound Assessment / Reassessment - one wound []  - 0 Complex Wound Assessment / Reassessment - multiple wounds []  - 0 Dermatologic / Skin Assessment (not related to wound area) ASSESSMENTS - Focused Assessment []  - 0 Circumferential Edema Measurements - multi extremities []  - 0 Nutritional Assessment / Counseling / Intervention VIRGLE, GAUDET Cardenas ([id number]) [id number] Page 2 of 5 []  - 0 Lower Extremity Assessment (monofilament, tuning fork, pulses) []  - 0 Peripheral Arterial Disease Assessment (using hand held doppler) ASSESSMENTS - Ostomy and/or Continence Assessment and Care []  - 0 Incontinence Assessment and Management []  - 0 Ostomy Care Assessment and Management (repouching, etc.) PROCESS - Coordination of Care X - Simple Patient / Family Education for ongoing care 1 15 []  - 0 Complex (extensive) Patient / Family Education for ongoing care []  - 0 Staff obtains Chiropractor, Records, T Results / Process Orders est []  - 0 Staff telephones HHA, Nursing Homes / Clarify orders / etc []  - 0 Routine Transfer to another Facility (non-emergent condition) []  - 0 Routine Hospital Admission (non-emergent condition) []  - 0 New Admissions / Manufacturing engineer / Ordering NPWT Apligraf, etc. , []  - 0 Emergency Hospital Admission (emergent condition) X- 1 10 Simple Discharge Coordination []  - 0 Complex (extensive) Discharge Coordination PROCESS - Special Needs []  - 0 Pediatric / Minor Patient Management []  - 0 Isolation Patient Management []  - 0 Hearing / Language / Visual special needs []  - 0 Assessment of Community assistance  (transportation, D/C planning, etc.) []  - 0 Additional assistance / Altered mentation []  - 0 Support Surface(s) Assessment (bed, cushion, seat, etc.) INTERVENTIONS - Wound Cleansing / Measurement X - Simple Wound Cleansing -  one wound 1 5 []  - 0 Complex Wound Cleansing - multiple wounds []  - 0 Wound Imaging (photographs - any number of wounds) []  - 0 Wound Tracing (instead of photographs) []  - 0 Simple Wound Measurement - one wound []  - 0 Complex Wound Measurement - multiple wounds INTERVENTIONS - Wound Dressings X - Small Wound Dressing one or multiple wounds 1 10 []  - 0 Medium Wound Dressing one or multiple wounds []  - 0 Large Wound Dressing one or multiple wounds []  - 0 Application of Medications - topical []  - 0 Application of Medications - injection INTERVENTIONS - Miscellaneous []  - 0 External ear exam []  - 0 Specimen Collection (cultures, biopsies, blood, body fluids, etc.) []  - 0 Specimen(s) / Culture(s) sent or taken to Lab for analysis []  - 0 Patient Transfer (multiple staff / Nurse, adult / Similar devices) []  - 0 Simple Staple / Suture removal (25 or less) []  - 0 Complex Staple / Suture removal (26 or more) []  - 0 Hypo / Hyperglycemic Management (close monitor of Blood Glucose) DASHAY, MARCHUK Cardenas ([id number]) [id number] Page 3 of 5 []  - 0 Ankle / Brachial Index (ABI) - do not check if billed separately []  - 0 Vital Signs Has the patient been seen at the hospital within the last three years: Yes Total Score: 55 Level Of Care: New/Established - Level 2 Electronic Signature(s) Signed: 09/17/2022 4:04:59 PM By: Thayer Dallas Entered By: Thayer Dallas on 09/16/2022 16:13:05 -------------------------------------------------------------------------------- Encounter Discharge Information Details Patient Name: Date of Service: [name]. 09/16/2022 3:30 PM Medical Record Number: [id number] Patient Account Number:  [id number] Date of Birth/Sex: Treating RN: [birth year] (72 y.o. F) Primary Care Ilea Hilton: Jayme Cloud., Mort Sawyers Clinician: Thayer Dallas Referring Laurinda Carreno: Treating Karely Hurtado/Extender: Allena Katz., [name] in Treatment: 36 Encounter Discharge Information Items Discharge Condition: Stable Ambulatory Status: Ambulatory Discharge Destination: Home Transportation: Private Auto Accompanied By: self Schedule Follow-up Appointment: Yes Clinical Summary of Care: Electronic Signature(s) Signed: 09/17/2022 4:04:59 PM By: Thayer Dallas Entered By: Thayer Dallas on 09/16/2022 16:13:35 -------------------------------------------------------------------------------- Patient/Caregiver Education Details Patient Name: Date of Service: [name] 7/8/2024andnbsp3:30 PM Medical Record Number: [id number] Patient Account Number: [id number] Date of Birth/Gender: Treating RN: [birth year] (72 y.o. F) Primary Care Physician: Jayme Cloud., [name] Other Clinician: Thayer Dallas Referring Physician: Treating Physician/Extender: Crissie Figures in Treatment: 36 Education Assessment Education Provided To: Patient Education Topics Provided Electronic Signature(s) Signed: 09/17/2022 4:04:59 PM By: Thayer Dallas Entered By: Thayer Dallas on 09/16/2022 16:13:21 [name] ([id number]) [id number] Page 4 of 5 -------------------------------------------------------------------------------- Wound Assessment Details Patient Name: Date of Service: [name] 09/16/2022 3:30 PM Medical Record Number: [id number] Patient Account Number: [id number] Date of Birth/Sex: Treating RN: September 15, 1950 (72 y.o. F) Primary Care Demaris Leavell: Jayme Cloud., [name] Other Clinician: Referring Fruma [place]: Treating Damieon Armendariz/Extender: Crissie Figures in Treatment: 36 Wound Status Wound Number: 1 Primary Etiology:  Pressure Ulcer Wound Location: Sacrum Wound Status: Open Wounding Event: Pressure Injury Date Acquired: 10/09/2021 Weeks Of Treatment: 36 Clustered Wound: No Wound Measurements Length: (cm) 0.4 Width: (cm) 0.3 Depth: (cm) 0.8 Area: (cm) 0.094 Volume: (cm) 0.075 % Reduction in Area: 73.4% % Reduction in Volume: 69.6% Wound Description Classification: Category/Stage IV Exudate Amount: Medium Exudate Type: Serosanguineous Exudate Color: red, brown Periwound Skin Texture Texture Color No Abnormalities Noted: No No Abnormalities Noted: No Moisture No Abnormalities Noted: No Treatment Notes Wound #1 (Sacrum) Cleanser Normal Saline Discharge Instruction: Cleanse the wound  with Normal Saline prior to applying a clean dressing using gauze sponges, not tissue or cotton balls. Vashe 5.8 (oz) Discharge Instruction: Cleanse the wound with Vashe prior to applying a clean dressing using gauze sponges, not tissue or cotton balls. Peri-Wound Care Skin Prep Discharge Instruction: Use skin prep as directed Topical Primary Dressing Endoform 2x2 in Discharge Instruction: Moisten with saline Secondary Dressing Woven Gauze Sponge, Non-Sterile 4x4 in Discharge Instruction: Apply over primary dressing as directed. Zetuvit Plus Silicone Border Dressing 7x7(in/in) Discharge Instruction: Apply silicone border over primary dressing as directed. Secured With [employer] Compression Stockings KALANA, STEGENGA Cardenas ([id number]) [id number] Page 5 of 5 Add-Ons Electronic Signature(s) Signed: 09/17/2022 4:04:59 PM By: Thayer Dallas Entered By: Thayer Dallas on 09/16/2022 16:03:16 -------------------------------------------------------------------------------- Vitals Details Patient Name: Date of Service: [name]. 09/16/2022 3:30 PM Medical Record Number: [id number] Patient Account Number: [id number] Date of Birth/Sex: Treating RN: 01/20/1951 (72 y.o.  F) Primary Care Farhad Burleson: Jayme Cloud., [name] Other Clinician: Referring Kirandeep Fariss: Treating Kayshawn Ozburn/Extender: Crissie Figures in Treatment: 36 Vital Signs Time Taken: 16:02 Reference Range: 80 - 120 mg / dl Height (in): 64 Weight (lbs): 97 Body Mass Index (BMI): 16.6 Electronic Signature(s) Signed: 09/17/2022 4:04:59 PM By: Thayer Dallas Entered By: Thayer Dallas on 09/16/2022 16:03:02

## 2022-09-18 ENCOUNTER — Emergency Department (HOSPITAL_COMMUNITY)
Admission: EM | Admit: 2022-09-18 | Discharge: 2022-09-19 | Disposition: A | Discharge status: Home / Self Care | Payer: 59 | Attending: Emergency Medicine | Admitting: Emergency Medicine

## 2022-09-18 ENCOUNTER — Emergency Department (HOSPITAL_COMMUNITY): Payer: 59

## 2022-09-18 ENCOUNTER — Encounter (HOSPITAL_COMMUNITY): Payer: Self-pay

## 2022-09-18 ENCOUNTER — Encounter (HOSPITAL_BASED_OUTPATIENT_CLINIC_OR_DEPARTMENT_OTHER): Payer: 59 | Admitting: Physician Assistant

## 2022-09-18 ENCOUNTER — Other Ambulatory Visit: Payer: Self-pay

## 2022-09-18 DIAGNOSIS — L89153 Pressure ulcer of sacral region, stage 3: Secondary | ICD-10-CM | POA: Diagnosis not present

## 2022-09-18 DIAGNOSIS — M25552 Pain in left hip: Secondary | ICD-10-CM | POA: Insufficient documentation

## 2022-09-18 DIAGNOSIS — M1612 Unilateral primary osteoarthritis, left hip: Secondary | ICD-10-CM | POA: Diagnosis not present

## 2022-09-18 DIAGNOSIS — Z681 Body mass index (BMI) 19 or less, adult: Secondary | ICD-10-CM | POA: Diagnosis not present

## 2022-09-18 DIAGNOSIS — M8648 Chronic osteomyelitis with draining sinus, other site: Secondary | ICD-10-CM | POA: Diagnosis not present

## 2022-09-18 DIAGNOSIS — J449 Chronic obstructive pulmonary disease, unspecified: Secondary | ICD-10-CM | POA: Insufficient documentation

## 2022-09-18 DIAGNOSIS — E43 Unspecified severe protein-calorie malnutrition: Secondary | ICD-10-CM | POA: Diagnosis not present

## 2022-09-18 DIAGNOSIS — Z7951 Long term (current) use of inhaled steroids: Secondary | ICD-10-CM | POA: Insufficient documentation

## 2022-09-18 DIAGNOSIS — R9431 Abnormal electrocardiogram [ECG] [EKG]: Secondary | ICD-10-CM | POA: Diagnosis not present

## 2022-09-18 DIAGNOSIS — M6281 Muscle weakness (generalized): Secondary | ICD-10-CM | POA: Diagnosis not present

## 2022-09-18 DIAGNOSIS — M25571 Pain in right ankle and joints of right foot: Secondary | ICD-10-CM | POA: Diagnosis not present

## 2022-09-18 DIAGNOSIS — L89154 Pressure ulcer of sacral region, stage 4: Secondary | ICD-10-CM | POA: Diagnosis not present

## 2022-09-18 DIAGNOSIS — R6889 Other general symptoms and signs: Secondary | ICD-10-CM | POA: Diagnosis not present

## 2022-09-18 DIAGNOSIS — R262 Difficulty in walking, not elsewhere classified: Secondary | ICD-10-CM | POA: Insufficient documentation

## 2022-09-18 DIAGNOSIS — Z743 Need for continuous supervision: Secondary | ICD-10-CM | POA: Diagnosis not present

## 2022-09-18 DIAGNOSIS — R2689 Other abnormalities of gait and mobility: Secondary | ICD-10-CM | POA: Diagnosis not present

## 2022-09-18 LAB — URINALYSIS, ROUTINE W REFLEX MICROSCOPIC
Bilirubin Urine: NEGATIVE
Glucose, UA: NEGATIVE mg/dL
Hgb urine dipstick: NEGATIVE
Ketones, ur: NEGATIVE mg/dL
Leukocytes,Ua: NEGATIVE
Nitrite: NEGATIVE
Protein, ur: NEGATIVE mg/dL
Specific Gravity, Urine: 1.02 (ref 1.005–1.030)
pH: 5 (ref 5.0–8.0)

## 2022-09-18 LAB — COMPREHENSIVE METABOLIC PANEL
ALT: 21 U/L (ref 0–44)
AST: 23 U/L (ref 15–41)
Albumin: 3.6 g/dL (ref 3.5–5.0)
Alkaline Phosphatase: 134 U/L — ABNORMAL HIGH (ref 38–126)
Anion gap: 8 (ref 5–15)
BUN: 22 mg/dL (ref 8–23)
CO2: 28 mmol/L (ref 22–32)
Calcium: 9.4 mg/dL (ref 8.9–10.3)
Chloride: 105 mmol/L (ref 98–111)
Creatinine, Ser: 0.7 mg/dL (ref 0.44–1.00)
GFR, Estimated: >60 mL/min (ref >60)
Glucose, Bld: 95 mg/dL (ref 70–99)
Potassium: 3.4 mmol/L — ABNORMAL LOW (ref 3.5–5.1)
Sodium: 141 mmol/L (ref 135–145)
Total Bilirubin: 0.4 mg/dL (ref 0.3–1.2)
Total Protein: 6.6 g/dL (ref 6.5–8.1)

## 2022-09-18 LAB — CBC WITH DIFFERENTIAL/PLATELET
Abs Immature Granulocytes: 0.01 10*3/uL (ref 0.00–0.07)
Basophils Absolute: 0 10*3/uL (ref 0.0–0.1)
Basophils Relative: 1 %
Eosinophils Absolute: 0.1 10*3/uL (ref 0.0–0.5)
Eosinophils Relative: 2 %
HCT: 37.9 % (ref 36.0–46.0)
Hemoglobin: 12.1 g/dL (ref 12.0–15.0)
Immature Granulocytes: 0 %
Lymphocytes Relative: 32 %
Lymphs Abs: 2.5 10*3/uL (ref 0.7–4.0)
MCH: 29.7 pg (ref 26.0–34.0)
MCHC: 31.9 g/dL (ref 30.0–36.0)
MCV: 93.1 fL (ref 80.0–100.0)
Monocytes Absolute: 0.7 10*3/uL (ref 0.1–1.0)
Monocytes Relative: 8 %
Neutro Abs: 4.4 10*3/uL (ref 1.7–7.7)
Neutrophils Relative %: 57 %
Platelets: 314 10*3/uL (ref 150–400)
RBC: 4.07 MIL/uL (ref 3.87–5.11)
RDW: 13.9 % (ref 11.5–15.5)
WBC: 7.8 10*3/uL (ref 4.0–10.5)
nRBC: 0 % (ref 0.0–0.2)

## 2022-09-18 MED ORDER — LIDOCAINE 5 % EX PTCH
1.0000 | MEDICATED_PATCH | CUTANEOUS | Status: DC
  Administered 2022-09-18: 1 via TRANSDERMAL
  Filled 2022-09-18: qty 1

## 2022-09-18 MED ORDER — DICLOFENAC SODIUM 1 % EX GEL: 2.0000 g | Freq: Four times a day (QID) | CUTANEOUS | 0 refills | Status: DC

## 2022-09-18 MED ORDER — LACTATED RINGERS IV BOLUS
500.0000 mL | Freq: Once | INTRAVENOUS | Status: AC
  Administered 2022-09-18: 500 mL via INTRAVENOUS

## 2022-09-18 NOTE — Discharge Instructions (Addendum)
You are seen today for pain in your left hip and right ankle with some trouble walking.  You do have arthritis in your left hip.  Your right ankle is swollen.  Is possible you may have twisted it at some point without realizing it.  I do not see any signs of infection at this time.  You have good circulation in the foot.  We will do an Ace wrap, try to keep your ankle elevated.  Since you are already on Subutex for pain we will prescribe you Voltaren gel which may be able to help.  Follow-up with orthopedic doctor.  Come back to the ER if you have any new or worsening symptoms especially if you develop worsening pain, fevers, increased swelling or any other new or worsening symptoms.

## 2022-09-18 NOTE — ED Provider Notes (Signed)
[place] EMERGENCY DEPARTMENT AT [facility] Provider Note   CSN: [id number] Arrival date & time: 09/18/22  1930     History  Chief Complaint  Patient presents with   Right Leg Pain   Leg Pain    [name] is a 72 y.o. female.  PMH of sacral pressure ulcer, COPD, GERD.  Presents the ER today complaining of difficulty walking for the past 4 weeks.  States she has to take baby steps.  States her walking is somewhat limited due to pain in the right ankle and left brain.  This is started the past several days.  She denies any numbness or tingling, denies trauma.  No weakness.  She denies fevers or chills.  Does note that she is scheduled to have surgery on Monday for osteomyelitis in her hip and sacral decubitus ulcer at [facility].   Leg Pain      Home Medications Prior to Admission medications   Medication Sig Start Date End Date Taking? Authorizing Provider  diclofenac Sodium (VOLTAREN) 1 % GEL Apply 2 g topically 4 (four) times daily. 09/18/22  Yes Blaire Palomino A, PA-C  acetaminophen (TYLENOL) 500 MG tablet Take 2 tablets (1,000 mg total) by mouth every 6 (six) hours as needed for mild pain. 11/15/20   Mancel Bale, MD  albuterol (VENTOLIN HFA) 108 (90 Base) MCG/ACT inhaler Inhale 1-2 puffs into the lungs every 6 (six) hours as needed for wheezing or shortness of breath. 02/28/22   [provider]  ARIPiprazole (ABILIFY) 5 MG tablet Take 5 mg by mouth daily. 02/14/22   [provider]  buprenorphine (SUBUTEX) 2 MG SUBL SL tablet Place 2 tablets (4 mg total) under the tongue every 6 (six) hours as needed (pain). 03/08/22   Sherryll Burger, Pratik D, DO  carvedilol (COREG) 6.25 MG tablet Take 1 tablet (6.25 mg total) by mouth 2 (two) times daily with a meal. 11/15/20   Mancel Bale, MD  DHEA 10 MG CAPS Take 1 capsule by mouth daily.    [provider]  donepezil (ARICEPT) 10 MG tablet Take 10 mg by mouth daily. 02/15/22   [provider]  doxepin (SINEQUAN) 50 MG capsule Take 1 capsule (50 mg total) by mouth daily. 03/08/22   Sherryll Burger, Pratik D, DO  GOODSENSE IBUPROFEN 200 MG tablet Take 200 mg by mouth daily. 02/14/22   [provider]  Iodine Strong, Lug[place], (STRONG IODINE) 5 % solution Take 1 mL by mouth 3 (three) times a week. 12/10/21   [provider]  LORazepam (ATIVAN) 0.5 MG tablet Take 1 tablet (0.5 mg total) by mouth 2 (two) times daily as needed for anxiety. 03/08/22   Sherryll Burger, Pratik D, DO  losartan (COZAAR) 100 MG tablet Take 100 mg by mouth daily. 02/15/22   [provider]  mirtazapine (REMERON) 7.5 MG tablet Take 7.5 mg by mouth at bedtime. 02/20/22   [provider]  naloxone [facility]) nasal spray 4 mg/0.1 mL Place 1 spray into the nose once. 12/04/21   [provider]  Nutritional Supplements (ENSURE ORIGINAL) LIQD Take 1 Can by mouth in the morning, at noon, in the evening, and at bedtime. 02/01/22   [provider]  OLANZapine (ZYPREXA) 10 MG tablet Take 1 tablet (10 mg total) by mouth at bedtime. 03/08/22   Sherryll Burger, Pratik D, DO  temazepam (RESTORIL) 22.5 MG capsule Take 1 capsule (22.5 mg total) by mouth at bedtime. 03/08/22   Maurilio Lovely D, DO  traMADol (ULTRAM) 50 MG tablet Take 1 tablet (50 mg total) by mouth every 4 (four) hours as needed for moderate pain. 03/08/22   Sherryll Burger, Pratik D, DO  TRELEGY ELLIPTA 100-62.5-25 MCG/ACT AEPB Inhale 1 puff into the lungs daily. 02/28/22   [provider]  VITAMIN A PO Take 1 capsule by mouth 3 (three) times a week.    [provider]  Zinc Sulfate 220 (50 Zn) MG TABS Take 0.5 tablets by mouth 3 (three) times a week. 02/14/22   [provider]      Allergies    Tylenol [acetaminophen], Codeine, Nsaids, Tolmetin, and Tramadol    Review of Systems   Review of Systems  Physical Exam Updated Vital Signs BP 107/67 (BP Location: Left Arm)   Pulse 61   Temp (!) 97.4 F (36.3 C) (Oral)    Resp 12   SpO2 94%  Physical Exam Vitals and nursing note reviewed.  Constitutional:      General: She is not in acute distress.    Appearance: She is well-developed.  HENT:     Head: Normocephalic and atraumatic.  Eyes:     Conjunctiva/sclera: Conjunctivae normal.  Cardiovascular:     Rate and Rhythm: Normal rate and regular rhythm.     Heart sounds: No murmur heard. Pulmonary:     Effort: Pulmonary effort is normal. No respiratory distress.     Breath sounds: Normal breath sounds.  Abdominal:     Palpations: Abdomen is soft.     Tenderness: There is no abdominal tenderness.  Musculoskeletal:        General: Swelling present.     Cervical back: Neck supple.     Comments: Mild soft tissue swelling right ankle diffusely, mild diffuse tenderness.  Normal range of motion.  DP and PT pulses intact.  Patient can bear weight.  Skin:    General: Skin is warm and dry.     Capillary Refill: Capillary refill takes less than 2 seconds.  Neurological:     General: No focal deficit present.     Mental Status: She is alert and oriented to person, place, and time.  Psychiatric:        Mood and Affect: Mood normal.     ED Results / Procedures / Treatments   Labs (all labs ordered are listed, but only abnormal results are displayed) Labs Reviewed  COMPREHENSIVE METABOLIC PANEL - Abnormal; Notable for the following components:      Result Value   Potassium 3.4 (*)    Alkaline Phosphatase 134 (*)    All other components within normal limits  URINALYSIS, ROUTINE W REFLEX MICROSCOPIC - Abnormal; Notable for the following components:   APPearance HAZY (*)    All other components within normal limits  CBC WITH DIFFERENTIAL/PLATELET    EKG None  Radiology DG Hip Unilat W or Wo Pelvis 2-3 Views Left  Result Date: 09/18/2022 CLINICAL DATA:  Left hip pain. EXAM: DG HIP (WITH OR WITHOUT PELVIS) 2-3V LEFT COMPARISON:  None Available. FINDINGS: There is no evidence of hip fracture or  dislocation. Mild to moderate severity degenerative changes are seen in the form of joint space narrowing and acetabular sclerosis. IMPRESSION: Mild to moderate severity degenerative changes. Electronically Signed   By: Aram Candela M.D.   On: 09/18/2022 23:05   DG Ankle Complete Right  Result Date: 09/18/2022 CLINICAL DATA:  Right ankle pain. EXAM: RIGHT ANKLE - COMPLETE 3+ VIEW COMPARISON:  None Available. FINDINGS: There is no evidence  of fracture, dislocation, or joint effusion. There is no evidence of arthropathy or other focal bone abnormality. Moderate severity diffuse soft tissue swelling is seen. IMPRESSION: Moderate severity diffuse soft tissue swelling without evidence of acute fracture or dislocation. Electronically Signed   By: Aram Candela M.D.   On: 09/18/2022 23:04    Procedures Procedures    Medications Ordered in ED Medications  lidocaine (LIDODERM) 5 % 1 patch (has no administration in time range)  lactated ringers bolus 500 mL (500 mLs Intravenous New Bag/Given 09/18/22 2223)    ED Course/ Medical Decision Making/ A&P                             Medical Decision Making DDx: Fracture, sprain, contusion, electrolyte abnormality, UTI, other ED course: Patient reports trouble walking for the past 4 weeks but otherwise couple days it has been limited to pain in the right ankle and the left groin.  She has tenderness of both these areas, she does have mild right ankle swelling.  She can bear weight and ambulate.  Left hip has no swelling, no limited range of motion, mild anterior hip tenderness.  X-ray of the left ankle does show soft tissue swelling but no bony abnormality.  X-ray left hip and pelvis shows degenerative changes with no acute findings.  She has no limited range of motion, no redness, no white count.  I do not suspect septic arthritis.  Her labs are all reassuring.  She has no calf pain or swelling or tenderness to suggest a DVT.  She is agreeable with  Aircast, walker and Ortho follow-up.  We discussed the Voltaren gel topically that when used for short period should not significantly increase her risk for stomach bleeding or ulcers.    Amount and/or Complexity of Data Reviewed Labs: ordered. Radiology: ordered.           Final Clinical Impression(s) / ED Diagnoses Final diagnoses:  Pain of left hip  Acute right ankle pain    Rx / DC Orders ED Discharge Orders          Ordered    diclofenac Sodium (VOLTAREN) 1 % GEL  4 times daily        09/18/22 2322              Josem Kaufmann 09/18/22 2323    Vanetta Mulders, MD 09/19/22 2253

## 2022-09-18 NOTE — ED Triage Notes (Addendum)
Pt states that she had right leg pain x2 days and now is complaining of both hurting now. States she can not talk. EMS states pt walked to stretcher. States "they just don't want to work like they are supposed too". \  Denies injury

## 2022-09-18 NOTE — ED Notes (Signed)
Patient transported to X-ray 

## 2022-09-18 NOTE — ED Notes (Signed)
Attempted to call [facility] for patient discharge

## 2022-09-19 NOTE — ED Notes (Signed)
Pt given juice and crackers with peanut butte while awaiting transport.

## 2022-09-19 NOTE — ED Notes (Signed)
Attempted to call [facility]. No answer and mailbox full

## 2022-09-19 NOTE — ED Notes (Addendum)
This RN has attempted to call pt's facility at [phone] (which is listed on the chart and on her med rec) with no answer after multiple attempts and no way to leave a voicemail. There was a number listed to call [facility] for transportation pickup at [phone], but also no answer and no way to leave a voicemail. One other number listed on chart was called, [phone], and she said she doesn't work at this pt's particular facility but gave me the number to the supervisor who is working today at her facility, [phone]. Number was called and also no answer, but I was able to leave a voicemail for her to return my call.

## 2022-09-19 NOTE — Progress Notes (Signed)
Marissa, Cardenas Cardenas ([id number]) [id number] Page 1 of 8 Visit Report for 09/18/2022 Chief Complaint Document Details Patient Name: Date of Service: [name] 09/18/2022 8:45 A M Medical Record Number: [id number] Patient Account Number: [id number] Date of Birth/Sex: Treating RN: Sep 12, 1950 (72 y.o. F) Primary Care Provider: Jayme Cloud., [name] Other Clinician: Referring Provider: Treating Provider/Extender: Crista Curb in Treatment: 37 Information Obtained from: Patient Chief Complaint Pressure ulcer stage 3 Sacrum Electronic Signature(s) Signed: 09/18/2022 10:24:25 AM By: Allen Derry PA-C Entered By: Allen Derry on 09/18/2022 10:24:25 -------------------------------------------------------------------------------- HPI Details Patient Name: Date of Service: [name], [name]. 09/18/2022 8:45 A M Medical Record Number: [id number] Patient Account Number: [id number] Date of Birth/Sex: Treating RN: 29-Jan-1951 (72 y.o. F) Primary Care Provider: Jayme Cloud., [name] Other Clinician: Referring Provider: Treating Provider/Extender: Crista Curb in Treatment: 37 History of Present Illness HPI Description: 01-02-22 upon evaluation today patient presents for initial inspection here in our clinic concerning a wound that she has over the sacral region. This is stated to be present since around the beginning of August 2023. She currently resides in a assisted nursing facility. She does seem to be able to answer questions fully today. Subsequently I do note that she is a little on the thin side but again other than the protein calorie malnutrition she is minimally weak but still does get up and move around some but she also tells me that she "sits a lot". She has not had any x-rays of the sacral region at this point. 01-09-2022 upon evaluation today patient's wound in the sacral area actually appears to be doing  decently well. Fortunately I do not see any signs of infection which is great news and overall I am extremely pleased with where things stand today. 11/8; this is a patient who lives in some form of small assisted living in [place]. She has a deep wound over the lower part of her coccyx. An x-ray that we ordered from last time did not show any osseous abnormalities. We are supposed to be using Hydrofera Blue in the wound but I am really not sure what they are using to dress this and who is doing it. Talking to our staff they have apparently discussed this with the staff in the facility. She does not have home health. 01-23-2022 upon evaluation today patient appears to be doing decently well in regard to her wound. She has been tolerating the dressing changes without complication although I am not certain the dressing changes have been done appropriately over the past couple of weeks. We have been trying to get her to come in here we also try to get her into a wound care center in [place] which would be closer for the assisted living facility. Unfortunately neither 1 of those were undertaken up to this point. The patient did end up having some training of the staff from an RN that they brought him to have the staff perform the dressing changes but that being said it does not sound like this has been done correctly. Nonetheless I do believe that based on what we see it would benefit the patient to actually have her come here 3 times a week also think a wound VAC could potentially be a possibility for her which would help to get things moving in a much better direction as far as healing is concerned. We need to get this to fill-in though it looks clean and  does not look infected I do think that we need to really get this to fill-in and there is quite a bit of undermining that is to be considered here. 01-30-2022 upon evaluation today patient appears to be doing well currently in regard to her wound which  is looking pretty decent but still has quite a bit of space underneath as far as undermining is concerned. I think she might possibly be better with a wound VAC. With that being said if when I do this we probably only be able to do it 2 times a week at most. I discussed that with the patient today she is okay with that we just need to see if we can get the insurance approval and then of course the scheduling underway. 02-13-2022 upon evaluation today patient appears to be doing well currently in regard to her wound. She actually tells me that it is feeling a lot better which is good news. Fortunately I do not see any signs of active infection at this time. No fevers, chills, nausea, vomiting, or diarrhea. 02-27-2022 upon evaluation today patient appears to be doing well currently in regard to her wound. She is actually showing some signs of improvement we are [name], [name] ([id number]) [id number] Page 2 of 8 still obtaining the approval for the snap VAC which I think could be beneficial in the meantime we been using the Hydrofera Blue rope. 03-27-2022 upon evaluation today patient appears to be doing okay in regard to her wound but there was no dressing in place upon arrival today. Fortunately I see again no evidence of infection. No fevers, chills, nausea, vomiting, or diarrhea. 04-03-2022 upon evaluation today patient did not have a dressing on when she came into the office. With that being said she actually tells me that it was on until yesterday she took a shower and it came loose and then today came off completely. With that being said I really do not think she needs to be taking shower every single day or having to see her here in the clinic 3 times per week on Monday, Wednesday, and Friday and during those times obviously in between she needs to probably avoid showering. For that reason what I advised her to do would be to shower in the mornings on the days that she  comes to see [place] that way if the dressing does come off or start to come off we will be changing it anyway. She voiced understanding and tells me she can do that. Unfortunately the facility has nobody to help her with changing the dressings and we are not currently having to do that here in the clinic again. This means that during the time that she was not coming in from December 20 through when I saw her last week on the 17th there was a period of 2 weeks where the wound really was not changed at all as the RN that Baird Lyons tell me they had at the facility did not end up working out. This obviously is not good and is not what we expect to see as far as patient care is concerned which is why we are now seeing her 3 times a week here in the clinic. 04-10-2022 upon evaluation today patient appears to be doing well currently in regard to her wound which is actually showing signs of good granulation epithelization at this point. However she does have quite a bit of drainage and we are little concerned about the possibility of infection. For  that reason I think she could benefit from a PCR culture followed by [facility] topical antibiotics. I am also thinking of going ahead and doing gentamicin today as well. 2/7; small wound on the lower sacrum however with considerable degree of undermining from roughly 7-4. PCR culture that was done last week showed "no organisms". We have been using gentamicin and iodoform packing.She lives in some form of group home in [place] I believe. Uncertain about the adequacy of offloading this area 04-24-2022 upon evaluation patient's wound actually is showing signs of doing decently well I do believe she would benefit from a snap VAC and we discussed that again today. Fortunately I do not see any signs of active infection locally nor systemically which is great news. No fevers, chills, nausea, vomiting, or diarrhea. 05-01-2022 upon evaluation today patient's wound is really doing  about the same. Fortunately I do not see any signs of active infection locally nor systemically at this time which is great news with that being said we have gotten approval for the snap VAC and we will going to go ahead and place that today. 05-08-2022 upon evaluation today patient appears to be doing well currently in regard to her wound all things considered but were having a difficult time with a snap VAC suctioning appropriately. With that being said we will get a go ahead and likely avoid the snap VAC at this point at least until we can get the actual brace dressing as bridging ourselves does not seem to be working nearly as well. 3/6; I note the difficulty with a snap VAC which is disappointing but apparently another type of VAC is being considered. Also a referral to [facility] at [facility]. The patient has a very small but deep and at the base circumferential undermining. She is coming in here now 3 times a week for [facility] placement 3/13; patient presents for follow-up. She has been using Hydrofera Blue 3 times a week. She comes into our clinic for nurse visits to have this done. We are still awaiting a wound VAC. She has no issues or complaints today. 05-29-2022 upon evaluation today patient appears to be doing about the same in regard to her wound. She still has significant undermining although the opening is a huge the undermining is significant. I do believe that a gauze VAC would be beneficial here. The patient does not eat normally and in fact tells me that she eats very well. She also is doing supplements with Juven at this point. She also has a good ability to get up and move around she is completely ambulatory and does not just lay with pressure on this area. She also is able to roll and reposition in bed without complication. For that reason she really has no need for a group 2 mattress at this point whatsoever this would be a complete waste of resources as far as insurance  and medical equipment is concerned. As far as her urinary incontinence she has no issues with incontinence at this point she is able to carry herself to the bathroom when needed she does wear a depends just in case but again that is not a routinely utilized item. 06-05-2022 upon evaluation today patient appears to be doing decently well in regard to her wound I do not see any signs of infection overall she seems to be doing quite well. I do think that she is making good progress in general here. We do have the wound VAC however and I think  this is going to potentially help to get this. Hand has been the biggest issue we have had is getting this to actually feeling more effectively. 06-12-2022 upon evaluation today patient appears to be doing okay in regard to her wound. We are still seeing some signs of improvement. Fortunately there does not appear to be any evidence of active infection at this time which is good news she did see the doctors over at [facility] yesterday and they are ordering an MRI in preparation for considering possibility of a surgical closure. The patient voiced understanding she had forgotten we have made this referral. T be o honest I was back in February kind of forgot about the referral to in the interim trying to proceed with her wound care. Nonetheless I appreciate their be evaluation of this and again we will see what the MRI shows. 06-19-2022 upon evaluation today patient's wound is actually showing signs of good improvement and very pleased with where we stand today. Fortunately I do not see any evidence of infection locally nor systemically which is great news I think the wound VAC is doing a good job of undermining seems less than last week. 06-26-2022 upon evaluation today patient appears to be doing well with regard to her wound although her wound VAC it actually died she had forgotten to charge it overnight. With that being said she does seem to be doing decently well at  this point based on what I am seeing I think that the undermining is much less than what it was previous. Fortunately I do not see any signs of active infection locally nor systemically at this time. 07-03-2022 upon evaluation today patient appears to be doing decently well in regard to her wound. She actually did have an MRI performed and I did review that today as well. It shows that she actually does have a focal marrow signal abnormality in the left aspect of the sacrum at the S4 level with adjacent soft tissue swelling and presumed sacral decubitus ulcer concerning for a small area of osteomyelitis. Otherwise there did not appear to be any fluid collection which is good news there is no signs of an obvious abscess. 07-10-2022 upon evaluation today patient appears to be doing well currently in regard to her wound. She has been tolerating the dressing changes without complication. Fortunately there does not appear to be any signs of active infection which is good news. 07-17-2022 upon evaluation today patient unfortunately does not seem to be making progress with the gauze VAC. We have given her a good trial with this and unfortunately I do not think that we are seeing any improvement for that reason I think it would be best to send this back and not continue therapy with the gauze back at this point. The patient voiced understanding and she is in agreement with that plan. She does have an appointment with the plastic surgeon at [facility] it was supposed to be yesterday but apparently got moved next Tuesday, May 14. 07-31-2022 upon evaluation today patient appears to be doing well currently in regard to her wound all things considered. She does have osteomyelitis of the sacral area which is noted. With that being said I think that the [facility] Blue rope has done decently well but I do believe that she may benefit from switching to endoform and just packed this very well with endoform. As it starts to  dissolve it would not be able to be over packed. She is in agreement with giving  this a trial. 08-07-2022 upon evaluation today patient appears to be doing really about the same in regard to her wound. The endoform I think may be doing a little bit better than the [facility] but were still not seeing a lot of progress here. This is unfortunate as again we are trying to get this healed as quickly as possible. Nonetheless I do believe that she would benefit from a likely antibiotic course as well as potentially hyperbaric oxygen therapy. With that being said I discussed with the patient at this point that we need to likely see about getting the labs done as it does not look like they were ever done back in April at the end when I ordered this. They were supposed to be done but we never received any results. Nonetheless at this point I think we would need updated results anyway in order to determine where we would need to go currently. She voiced understanding as did the individual that was with her today. [name], [name] ([id number]) [id number] Page 3 of 8 08-14-2022 upon evaluation today patient appears to be doing well currently in regard to her wound. This is any better but is also not any worse. We have been doing a good job keeping it under control. With that being said she did see Dr. Roxan Hockey at [facility] and they are actually proceed with a flap surgery. Again this I think is probably to be the primary best way to get this closed and she is in agreement with that plan she is actually very happy about the plan. 08-21-2022 upon evaluation patient appears to be doing about the same as compared to last week's evaluation. There is really no significant improvement overall in size or depth. She is still awaiting the appointment for the flap surgery. I did talk to the facility today and they are going to reach out to [facility] to try to figure out what the plan is as far as  getting her scheduled. 08-28-2022 upon evaluation today patient appears to be doing about the same in regard to her wound. The good news is she is not any worse the bad news is she is also not really significantly better but they are still waiting on the scheduling for the surgery at [facility]. That we will happen at some point right now it is in the insurance approval phase apparently based on what I am hearing. 09-04-2022 upon evaluation today patient presents for follow-up concerning her ongoing issues with the sacral wound. Was still waiting on the surgery to be scheduled in [facility] for the flap surgery. With that being said I do not see any evidence of active infection locally or systemically at this time. No fevers, chills, nausea, vomiting, or diarrhea. 09-11-2022 upon evaluation today patient appears to be doing about the same in regard to her wound. Fortunately there does not appear to be any signs of active infection at this time. No fevers, chills, nausea, vomiting, or diarrhea. 09-18-2022 upon evaluation today patient appears to be doing well current in regard to her wound. She has been tolerating the dressing changes without complication it does not appear to be infected she actually has her surgery for the flap on Monday. Electronic Signature(s) Signed: 09/18/2022 10:24:39 AM By: Allen Derry PA-C Entered By: Allen Derry on 09/18/2022 10:24:39 -------------------------------------------------------------------------------- Physical Exam Details Patient Name: Date of Service: CO Marissa Goody Cardenas. 09/18/2022 8:45 A M Medical Record Number: [id number] Patient Account Number: [id number] Date of Birth/Sex: Treating RN:  Dec 06, 1950 (71 y.o. F) Primary Care Provider: Jayme Cloud., [name] Other Clinician: Referring Provider: Treating Provider/Extender: Crista Curb in Treatment: 37 Constitutional Well-nourished and well-hydrated in no acute  distress. Respiratory normal breathing without difficulty. Psychiatric this patient is able to make decisions and demonstrates good insight into disease process. Alert and Oriented x 3. pleasant and cooperative. Notes See any signs of infection at this time which is great news and in general I do believe that she is making Upon inspection patient's wound bed actually showed signs of good granulation epithelization at this point. Fortunately I do not good headway towards maintaining but at this point we have been mainly holding things off keeping her under control so she got it for the flap surgery she now is proceeding for that on Monday. For that reason I will not have a follow-up appointment with her going forward presuming she does indeed move forward with the surgery on Monday which that is the plan. We will otherwise just see her back as needed going forward transferring care to the other provider. Electronic Signature(s) Signed: 09/18/2022 10:25:17 AM By: Allen Derry PA-C Entered By: Allen Derry on 09/18/2022 10:25:17 -------------------------------------------------------------------------------- Physician Orders Details Patient Name: Date of Service: CO Marissa Goody Cardenas. 09/18/2022 8:45 A M Medical Record Number: [id number] Patient Account Number: [id number] Date of Birth/Sex: Treating RN: 02-May-1950 (72 y.o. [name], [name] Primary Care Provider: Jayme Cloud., [name] Other Clinician: Referring Provider: Treating Provider/Extender: Virgina [place], Kerens Cardenas ([id number]) [phone].pdf Page 4 of 8 Weeks in Treatment: 37 Verbal / Phone Orders: No Diagnosis Coding ICD-10 Coding Code Description L89.154 Pressure ulcer of sacral region, stage 4 M86.48 Chronic osteomyelitis with draining sinus, other site M62.81 Muscle weakness (generalized) E43 Unspecified severe protein-calorie malnutrition Follow-up Appointments Nurse Visit: -  Friday at 1130 Discharge From [facility] Discharge from [facility]ound [facility] discharge you on Friday after your nurse visit. Plastic surgery at [facility] on 09/23/2022 and will take over care from there. Anesthetic (In clinic) Topical Lidocaine 4% applied to wound bed Off-Loading Turn and reposition every 2 hours - when sitting. walk hourly to aid in offloading pressure to buttock. Wound Treatment Wound #1 - Sacrum Cleanser: Normal Saline 3 x Per Week/30 Days Discharge Instructions: Cleanse the wound with Normal Saline prior to applying a clean dressing using gauze sponges, not tissue or cotton balls. Cleanser: Vashe 5.8 (oz) 3 x Per Week/30 Days Discharge Instructions: Cleanse the wound with Vashe prior to applying a clean dressing using gauze sponges, not tissue or cotton balls. Peri-Wound Care: Skin Prep 3 x Per Week/30 Days Discharge Instructions: Use skin prep as directed Prim Dressing: Endoform 2x2 in 3 x Per Week/30 Days ary Discharge Instructions: Moisten with saline Secondary Dressing: Woven Gauze Sponge, Non-Sterile 4x4 in 3 x Per Week/30 Days Discharge Instructions: Apply over primary dressing as directed. Secondary Dressing: Zetuvit Plus Silicone Border Dressing 7x7(in/in) 3 x Per Week/30 Days Discharge Instructions: Apply silicone border over primary dressing as directed. Electronic Signature(s) Signed: 09/18/2022 5:48:19 PM By: Allen Derry PA-C Signed: 09/18/2022 6:00:37 PM By: Shawn Stall RN, BSN Entered By: Shawn Stall on 09/18/2022 09:34:24 -------------------------------------------------------------------------------- Problem List Details Patient Name: Date of Service: CO Marissa Goody Cardenas. 09/18/2022 8:45 A M Medical Record Number: [id number] Patient Account Number: [id number] Date of Birth/Sex: Treating RN: Dec 26, 1950 (71 y.o. [name], [name] Primary Care Provider: Jayme Cloud., [name] Other Clinician: Referring Provider: Treating Provider/Extender: Irma Newness  Garald Balding in Treatment: 37 Active Problems ICD-10 Encounter Code Description Active Date MDM Diagnosis [name], [name] ([id number]) [id number] Page 5 of 8 L89.154 Pressure ulcer of sacral region, stage 4 01/02/2022 No Yes M86.48 Chronic osteomyelitis with draining sinus, other site 07/31/2022 No Yes M62.81 Muscle weakness (generalized) 01/02/2022 No Yes E43 Unspecified severe protein-calorie malnutrition 01/02/2022 No Yes Inactive Problems Resolved Problems Electronic Signature(s) Signed: 09/18/2022 10:24:20 AM By: Allen Derry PA-C Entered By: Allen Derry on 09/18/2022 10:24:20 -------------------------------------------------------------------------------- Progress Note Details Patient Name: Date of Service: CO Marissa Goody Cardenas. 09/18/2022 8:45 A M Medical Record Number: [id number] Patient Account Number: [id number] Date of Birth/Sex: Treating RN: Nov 09, 1950 (72 y.o. F) Primary Care Provider: Jayme Cloud., [name] Other Clinician: Referring Provider: Treating Provider/Extender: Crista Curb in Treatment: 37 Subjective Chief Complaint Information obtained from Patient Pressure ulcer stage 3 Sacrum History of Present Illness (HPI) 01-02-22 upon evaluation today patient presents for initial inspection here in our clinic concerning a wound that she has over the sacral region. This is stated to be present since around the beginning of August 2023. She currently resides in a assisted nursing facility. She does seem to be able to answer questions fully today. Subsequently I do note that she is a little on the thin side but again other than the protein calorie malnutrition she is minimally weak but still does get up and move around some but she also tells me that she "sits a lot". She has not had any x-rays of the sacral region at this point. 01-09-2022 upon evaluation today patient's wound in the sacral area  actually appears to be doing decently well. Fortunately I do not see any signs of infection which is great news and overall I am extremely pleased with where things stand today. 11/8; this is a patient who lives in some form of small assisted living in [place]. She has a deep wound over the lower part of her coccyx. An x-ray that we ordered from last time did not show any osseous abnormalities. We are supposed to be using Hydrofera Blue in the wound but I am really not sure what they are using to dress this and who is doing it. Talking to our staff they have apparently discussed this with the staff in the facility. She does not have home health. 01-23-2022 upon evaluation today patient appears to be doing decently well in regard to her wound. She has been tolerating the dressing changes without complication although I am not certain the dressing changes have been done appropriately over the past couple of weeks. We have been trying to get her to come in here we also try to get her into a wound care center in [place] [employer] which would be closer for the assisted living facility. Unfortunately neither 1 of those were undertaken up to this point. The patient did end up having some training of the staff from an RN that they brought him to have the staff perform the dressing changes but that being said it does not sound like this has been done correctly. Nonetheless I do believe that based on what we see it would benefit the patient to actually have her come here 3 times a week also think a wound VAC could potentially be a possibility for her which would help to get things moving in a much better direction as far as healing is concerned. We need to get this to fill-in though it looks clean and does not look  infected I do think that we need to really get this to fill-in and there is quite a bit of undermining that is to be considered here. 01-30-2022 upon evaluation today patient appears to be doing well currently  in regard to her wound which is looking pretty decent but still has quite a bit of space underneath as far as undermining is concerned. I think she might possibly be better with a wound VAC. With that being said if when I do this we probably only be able to do it 2 times a week at most. I discussed that with the patient today she is okay with that we just need to see if we can get the insurance approval and then of course the scheduling underway. 02-13-2022 upon evaluation today patient appears to be doing well currently in regard to her wound. She actually tells me that it is feeling a lot better which is good news. Fortunately I do not see any signs of active infection at this time. No fevers, chills, nausea, vomiting, or diarrhea. 02-27-2022 upon evaluation today patient appears to be doing well currently in regard to her wound. She is actually showing some signs of improvement we are [name], [name] ([id number]) [id number] Page 6 of 8 still obtaining the approval for the snap VAC which I think could be beneficial in the meantime we been using the Hydrofera Blue rope. 03-27-2022 upon evaluation today patient appears to be doing okay in regard to her wound but there was no dressing in place upon arrival today. Fortunately I see again no evidence of infection. No fevers, chills, nausea, vomiting, or diarrhea. 04-03-2022 upon evaluation today patient did not have a dressing on when she came into the office. With that being said she actually tells me that it was on until yesterday she took a shower and it came loose and then today came off completely. With that being said I really do not think she needs to be taking shower every single day or having to see her here in the clinic 3 times per week on Monday, Wednesday, and Friday and during those times obviously in between she needs to probably avoid showering. For that reason what I advised her to do would be to shower in the  mornings on the days that she comes to see [place] that way if the dressing does come off or start to come off we will be changing it anyway. She voiced understanding and tells me she can do that. Unfortunately the facility has nobody to help her with changing the dressings and we are not currently having to do that here in the clinic again. This means that during the time that she was not coming in from December 20 through when I saw her last week on the 17th there was a period of 2 weeks where the wound really was not changed at all as the RN that Baird Lyons tell me they had at the facility did not end up working out. This obviously is not good and is not what we expect to see as far as patient care is concerned which is why we are now seeing her 3 times a week here in the clinic. 04-10-2022 upon evaluation today patient appears to be doing well currently in regard to her wound which is actually showing signs of good granulation epithelization at this point. However she does have quite a bit of drainage and we are little concerned about the possibility of infection. For that reason I  think she could benefit from a PCR culture followed by [facility] topical antibiotics. I am also thinking of going ahead and doing gentamicin today as well. 2/7; small wound on the lower sacrum however with considerable degree of undermining from roughly 7-4. PCR culture that was done last week showed "no organisms". We have been using gentamicin and iodoform packing.She lives in some form of group home in [place] I believe. Uncertain about the adequacy of offloading this area 04-24-2022 upon evaluation patient's wound actually is showing signs of doing decently well I do believe she would benefit from a snap VAC and we discussed that again today. Fortunately I do not see any signs of active infection locally nor systemically which is great news. No fevers, chills, nausea, vomiting, or diarrhea. 05-01-2022 upon evaluation today  patient's wound is really doing about the same. Fortunately I do not see any signs of active infection locally nor systemically at this time which is great news with that being said we have gotten approval for the snap VAC and we will going to go ahead and place that today. 05-08-2022 upon evaluation today patient appears to be doing well currently in regard to her wound all things considered but were having a difficult time with a snap VAC suctioning appropriately. With that being said we will get a go ahead and likely avoid the snap VAC at this point at least until we can get the actual brace dressing as bridging ourselves does not seem to be working nearly as well. 3/6; I note the difficulty with a snap VAC which is disappointing but apparently another type of VAC is being considered. Also a referral to plastic surgery at [facility]. The patient has a very small but deep and at the base circumferential undermining. She is coming in here now 3 times a week for [facility] placement 3/13; patient presents for follow-up. She has been using Hydrofera Blue 3 times a week. She comes into our clinic for nurse visits to have this done. We are still awaiting a wound VAC. She has no issues or complaints today. 05-29-2022 upon evaluation today patient appears to be doing about the same in regard to her wound. She still has significant undermining although the opening is a huge the undermining is significant. I do believe that a gauze VAC would be beneficial here. The patient does not eat normally and in fact tells me that she eats very well. She also is doing supplements with Juven at this point. She also has a good ability to get up and move around she is completely ambulatory and does not just lay with pressure on this area. She also is able to roll and reposition in bed without complication. For that reason she really has no need for a group 2 mattress at this point whatsoever this would be a complete waste  of resources as far as insurance and medical equipment is concerned. As far as her urinary incontinence she has no issues with incontinence at this point she is able to carry herself to the bathroom when needed she does wear a depends just in case but again that is not a routinely utilized item. 06-05-2022 upon evaluation today patient appears to be doing decently well in regard to her wound I do not see any signs of infection overall she seems to be doing quite well. I do think that she is making good progress in general here. We do have the wound VAC however and I think this is going  to potentially help to get this. Hand has been the biggest issue we have had is getting this to actually feeling more effectively. 06-12-2022 upon evaluation today patient appears to be doing okay in regard to her wound. We are still seeing some signs of improvement. Fortunately there does not appear to be any evidence of active infection at this time which is good news she did see the doctors over at [facility] yesterday and they are ordering an MRI in preparation for considering possibility of a surgical closure. The patient voiced understanding she had forgotten we have made this referral. T be o honest I was back in February kind of forgot about the referral to in the interim trying to proceed with her wound care. Nonetheless I appreciate their be evaluation of this and again we will see what the MRI shows. 06-19-2022 upon evaluation today patient's wound is actually showing signs of good improvement and very pleased with where we stand today. Fortunately I do not see any evidence of infection locally nor systemically which is great news I think the wound VAC is doing a good job of undermining seems less than last week. 06-26-2022 upon evaluation today patient appears to be doing well with regard to her wound although her wound VAC it actually died she had forgotten to charge it overnight. With that being said she does  seem to be doing decently well at this point based on what I am seeing I think that the undermining is much less than what it was previous. Fortunately I do not see any signs of active infection locally nor systemically at this time. 07-03-2022 upon evaluation today patient appears to be doing decently well in regard to her wound. She actually did have an MRI performed and I did review that today as well. It shows that she actually does have a focal marrow signal abnormality in the left aspect of the sacrum at the S4 level with adjacent soft tissue swelling and presumed sacral decubitus ulcer concerning for a small area of osteomyelitis. Otherwise there did not appear to be any fluid collection which is good news there is no signs of an obvious abscess. 07-10-2022 upon evaluation today patient appears to be doing well currently in regard to her wound. She has been tolerating the dressing changes without complication. Fortunately there does not appear to be any signs of active infection which is good news. 07-17-2022 upon evaluation today patient unfortunately does not seem to be making progress with the gauze VAC. We have given her a good trial with this and unfortunately I do not think that we are seeing any improvement for that reason I think it would be best to send this back and not continue therapy with the gauze back at this point. The patient voiced understanding and she is in agreement with that plan. She does have an appointment with the plastic surgeon at [facility] it was supposed to be yesterday but apparently got moved next Tuesday, May 14. 07-31-2022 upon evaluation today patient appears to be doing well currently in regard to her wound all things considered. She does have osteomyelitis of the sacral area which is noted. With that being said I think that the [facility] Blue rope has done decently well but I do believe that she may benefit from switching to endoform and just packed this very well  with endoform. As it starts to dissolve it would not be able to be over packed. She is in agreement with giving this a trial.  08-07-2022 upon evaluation today patient appears to be doing really about the same in regard to her wound. The endoform I think may be doing a little bit better than the [facility] but were still not seeing a lot of progress here. This is unfortunate as again we are trying to get this healed as quickly as possible. Nonetheless I do believe that she would benefit from a likely antibiotic course as well as potentially hyperbaric oxygen therapy. With that being said I discussed with the patient at this point that we need to likely see about getting the labs done as it does not look like they were ever done back in April at the end when I ordered this. They were supposed to be done but we never received any results. Nonetheless at this point I think we would need updated results anyway in order to determine where we would need to go currently. She voiced understanding as did the individual that was with her today. [name], [name] ([id number]) [id number] Page 7 of 8 08-14-2022 upon evaluation today patient appears to be doing well currently in regard to her wound. This is any better but is also not any worse. We have been doing a good job keeping it under control. With that being said she did see Dr. Roxan Hockey at [facility] and they are actually proceed with a flap surgery. Again this I think is probably to be the primary best way to get this closed and she is in agreement with that plan she is actually very happy about the plan. 08-21-2022 upon evaluation patient appears to be doing about the same as compared to last week's evaluation. There is really no significant improvement overall in size or depth. She is still awaiting the appointment for the flap surgery. I did talk to the facility today and they are going to reach out to [facility] to try to figure  out what the plan is as far as getting her scheduled. 08-28-2022 upon evaluation today patient appears to be doing about the same in regard to her wound. The good news is she is not any worse the bad news is she is also not really significantly better but they are still waiting on the scheduling for the surgery at [facility]. That we will happen at some point right now it is in the insurance approval phase apparently based on what I am hearing. 09-04-2022 upon evaluation today patient presents for follow-up concerning her ongoing issues with the sacral wound. Was still waiting on the surgery to be scheduled in [facility] for the flap surgery. With that being said I do not see any evidence of active infection locally or systemically at this time. No fevers, chills, nausea, vomiting, or diarrhea. 09-11-2022 upon evaluation today patient appears to be doing about the same in regard to her wound. Fortunately there does not appear to be any signs of active infection at this time. No fevers, chills, nausea, vomiting, or diarrhea. 09-18-2022 upon evaluation today patient appears to be doing well current in regard to her wound. She has been tolerating the dressing changes without complication it does not appear to be infected she actually has her surgery for the flap on Monday. Objective Constitutional Well-nourished and well-hydrated in no acute distress. Vitals Time Taken: 9:13 AM, Height: 64 in, Weight: 97 lbs, BMI: 16.6, Temperature: 98.3 F, Pulse: 78 bpm, Respiratory Rate: 18 breaths/min, Blood Pressure: 112/51 mmHg. Respiratory normal breathing without difficulty. Psychiatric this patient is able to make decisions and demonstrates  good insight into disease process. Alert and Oriented x 3. pleasant and cooperative. General Notes: See any signs of infection at this time which is great news and in general I do believe that she is making Upon inspection patient's wound bed actually showed signs of good  granulation epithelization at this point. Fortunately I do not good headway towards maintaining but at this point we have been mainly holding things off keeping her under control so she got it for the flap surgery she now is proceeding for that on Monday. For that reason I will not have a follow-up appointment with her going forward presuming she does indeed move forward with the surgery on Monday which that is the plan. We will otherwise just see her back as needed going forward transferring care to the other provider. Integumentary (Hair, Skin) Wound #1 status is Open. Original cause of wound was Pressure Injury. The date acquired was: 10/09/2021. The wound has been in treatment 37 weeks. The wound is located on the Sacrum. The wound measures 0.4cm length x 0.2cm width x 0.4cm depth; 0.063cm^2 area and 0.025cm^3 volume. There is Fat Layer (Subcutaneous Tissue) exposed. There is no tunneling noted, however, there is undermining starting at 9:00 and ending at 12:00 with a maximum distance of 1.5cm. There is a medium amount of serosanguineous drainage noted. There is medium (34-66%) pink granulation within the wound bed. There is a medium (34- 66%) amount of necrotic tissue within the wound bed including Adherent Slough. The periwound skin appearance exhibited: Scarring. 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 discharge you on Friday after your nurse visit. Plastic surgery at [facility] on 09/23/2022 and will take over care from there. Anesthetic: (In clinic) Topical Lidocaine 4% applied to wound bed Off-Loading: Turn and reposition every 2 hours - when sitting. walk hourly to aid in offloading pressure to  buttock. [name], [name] ([id number]) [id number] Page 8 of 8 WOUND #1: - Sacrum Wound Laterality: Cleanser: Normal Saline 3 x Per Week/30 Days Discharge Instructions: Cleanse the wound with Normal Saline prior to applying a clean dressing using gauze sponges, not tissue or cotton balls. Cleanser: Vashe 5.8 (oz) 3 x Per Week/30 Days Discharge Instructions: Cleanse the wound with Vashe prior to applying a clean dressing using gauze sponges, not tissue or cotton balls. Peri-Wound Care: Skin Prep 3 x Per Week/30 Days Discharge Instructions: Use skin prep as directed Prim Dressing: Endoform 2x2 in 3 x Per Week/30 Days ary Discharge Instructions: Moisten with saline Secondary Dressing: Woven Gauze Sponge, Non-Sterile 4x4 in 3 x Per Week/30 Days Discharge Instructions: Apply over primary dressing as directed. Secondary Dressing: Zetuvit Plus Silicone Border Dressing 7x7(in/in) 3 x Per Week/30 Days Discharge Instructions: Apply silicone border over primary dressing as directed. 1. Based on what we are seeing at this point the wound is about the same she does have very flap surgery on Monday which is also she has been waiting quite a long time for this. Subsequently I did suggest to the patient that if she has any concerns to let me know but otherwise we will be transferring care to [facility] following the flap surgery until hopefully she should be completely discharged as it stands anyway. 2. I would recommend as well that the patient should continue to  monitor for any evidence of infection or worsening. Based on what I am seeing I do believe that she is really doing quite well and hopefully the surgery will go without a hitch. We will see her back for follow-up visit as needed. Electronic Signature(s) Signed: 09/18/2022 10:27:28 AM By: Allen Derry PA-C Entered By: Allen Derry on 09/18/2022  10:27:28 -------------------------------------------------------------------------------- SuperBill Details Patient Name: Date of Service: [place] Marissa Goody Cardenas. 09/18/2022 Medical Record Number: [id number] Patient Account Number: [id number] Date of Birth/Sex: Treating RN: 1950/04/13 (72 y.o. [name], [name] Primary Care Provider: Jayme Cloud., [name] Other Clinician: Referring Provider: Treating Provider/Extender: Crista Curb in Treatment: 37 Diagnosis Coding ICD-10 Codes Code Description [phone] Pressure ulcer of sacral region, stage 4 M86.48 Chronic osteomyelitis with draining sinus, other site M62.81 Muscle weakness (generalized) E43 Unspecified severe protein-calorie malnutrition Facility Procedures : CPT4 Code: [id number] Description: 99213 - WOUND CARE VISIT-LEV 3 EST PT Modifier: Quantity: 1 Physician Procedures : CPT4 Code Description Modifier [id number] 99213 - WC PHYS LEVEL 3 - EST PT ICD-10 Diagnosis Description L89.154 Pressure ulcer of sacral region, stage 4 M86.48 Chronic osteomyelitis with draining sinus, other site M62.81 Muscle weakness (generalized) E43  Unspecified severe protein-calorie malnutrition Quantity: 1 Electronic Signature(s) Signed: 09/18/2022 10:27:45 AM By: Allen Derry PA-C Entered By: Allen Derry on 09/18/2022 10:27:45

## 2022-09-19 NOTE — ED Notes (Signed)
Attempted to call report to [facility], no answer and mailbox is full so unable to leave voicemail.

## 2022-09-19 NOTE — ED Notes (Addendum)
Pt ambulated with walker to bathroom with stand-by nursing assist. Pt tolerated activity well and returned to bed with minimal difficulties.

## 2022-09-20 ENCOUNTER — Encounter (HOSPITAL_BASED_OUTPATIENT_CLINIC_OR_DEPARTMENT_OTHER): Payer: 59 | Admitting: Internal Medicine

## 2022-09-20 DIAGNOSIS — M8648 Chronic osteomyelitis with draining sinus, other site: Secondary | ICD-10-CM | POA: Diagnosis not present

## 2022-09-20 DIAGNOSIS — M6281 Muscle weakness (generalized): Secondary | ICD-10-CM | POA: Diagnosis not present

## 2022-09-20 DIAGNOSIS — E43 Unspecified severe protein-calorie malnutrition: Secondary | ICD-10-CM | POA: Diagnosis not present

## 2022-09-20 DIAGNOSIS — L89154 Pressure ulcer of sacral region, stage 4: Secondary | ICD-10-CM | POA: Diagnosis not present

## 2022-09-20 DIAGNOSIS — Z681 Body mass index (BMI) 19 or less, adult: Secondary | ICD-10-CM | POA: Diagnosis not present

## 2022-09-20 DIAGNOSIS — L89153 Pressure ulcer of sacral region, stage 3: Secondary | ICD-10-CM | POA: Diagnosis not present

## 2022-09-20 NOTE — Progress Notes (Addendum)
JACQUALYNN, EVERETT R ([id number]) [id number] Page 1 of 5 Visit Report for 09/20/2022 Arrival Information Details Patient Name: Date of Service: [name]. 09/20/2022 11:30 A M Medical Record Number: [id number] Patient Account Number: [id number] Date of Birth/Sex: Treating RN: 1950-10-14 (72 y.o. F) Primary Care Deloris Moger: Jayme Cloud., [name] Other Clinician: Referring Mikel Pyon: Treating Sudiksha Victor/Extender: Crissie Figures in Treatment: 37 Visit Information History Since Last Visit Added or deleted any medications: No Patient Arrived: Dan Humphreys Any new allergies or adverse reactions: No Arrival Time: 12:15 Had a fall or experienced change in No Accompanied By: self activities of daily living that may affect Transfer Assistance: None risk of falls: Patient Identification Verified: Yes Signs or symptoms of abuse/neglect since last visito No Secondary Verification Process Completed: Yes Hospitalized since last visit: No Patient Requires Transmission-Based Precautions: No Implantable device outside of the clinic excluding No Patient Has Alerts: No cellular tissue based products placed in the center since last visit: Has Dressing in Place as Prescribed: Yes Pain Present Now: No Electronic Signature(s) Signed: 09/20/2022 1:10:34 PM By: Thayer Dallas Entered By: Thayer Dallas on 09/20/2022 13:07:51 -------------------------------------------------------------------------------- Clinic Level of Care Assessment Details Patient Name: Date of Service: [name] 09/20/2022 11:30 A M Medical Record Number: [id number] Patient Account Number: [id number] Date of Birth/Sex: Treating RN: 23-Dec-1950 (72 y.o. F) Primary Care Breyanna Valera: Jayme Cloud., Mort Sawyers Clinician: Thayer Dallas Referring Lotoya Casella: Treating Cannen Dupras/Extender: Allena Katz., [name] in Treatment: 37 Clinic Level of Care Assessment  Items TOOL 4 Quantity Score X- 1 0 Use when only an EandM is performed on FOLLOW-UP visit ASSESSMENTS - Nursing Assessment / Reassessment X- 1 10 Reassessment of Co-morbidities (includes updates in patient status) X- 1 5 Reassessment of Adherence to Treatment Plan ASSESSMENTS - Wound and Skin A ssessment / Reassessment []  - 0 Simple Wound Assessment / Reassessment - one wound []  - 0 Complex Wound Assessment / Reassessment - multiple wounds []  - 0 Dermatologic / Skin Assessment (not related to wound area) ASSESSMENTS - Focused Assessment []  - 0 Circumferential Edema Measurements - multi extremities []  - 0 Nutritional Assessment / Counseling / Intervention TINAYA, SESSUMS R ([id number]) [id number].pdf Page 2 of 5 []  - 0 Lower Extremity Assessment (monofilament, tuning fork, pulses) []  - 0 Peripheral Arterial Disease Assessment (using hand held doppler) ASSESSMENTS - Ostomy and/or Continence Assessment and Care []  - 0 Incontinence Assessment and Management []  - 0 Ostomy Care Assessment and Management (repouching, etc.) PROCESS - Coordination of Care X - Simple Patient / Family Education for ongoing care 1 15 []  - 0 Complex (extensive) Patient / Family Education for ongoing care []  - 0 Staff obtains Chiropractor, Records, T Results / Process Orders est []  - 0 Staff telephones HHA, Nursing Homes / Clarify orders / etc []  - 0 Routine Transfer to another Facility (non-emergent condition) []  - 0 Routine Hospital Admission (non-emergent condition) []  - 0 New Admissions / Manufacturing engineer / Ordering NPWT Apligraf, etc. , []  - 0 Emergency Hospital Admission (emergent condition) X- 1 10 Simple Discharge Coordination []  - 0 Complex (extensive) Discharge Coordination PROCESS - Special Needs []  - 0 Pediatric / Minor Patient Management []  - 0 Isolation Patient Management []  - 0 Hearing / Language / Visual special needs []  - 0 Assessment of  Community assistance (transportation, D/C planning, etc.) []  - 0 Additional assistance / Altered mentation []  - 0 Support Surface(s) Assessment (bed, cushion, seat, etc.) INTERVENTIONS - Wound Cleansing / Measurement X -  Simple Wound Cleansing - one wound 1 5 []  - 0 Complex Wound Cleansing - multiple wounds []  - 0 Wound Imaging (photographs - any number of wounds) []  - 0 Wound Tracing (instead of photographs) []  - 0 Simple Wound Measurement - one wound []  - 0 Complex Wound Measurement - multiple wounds INTERVENTIONS - Wound Dressings X - Small Wound Dressing one or multiple wounds 1 10 []  - 0 Medium Wound Dressing one or multiple wounds []  - 0 Large Wound Dressing one or multiple wounds []  - 0 Application of Medications - topical []  - 0 Application of Medications - injection INTERVENTIONS - Miscellaneous []  - 0 External ear exam []  - 0 Specimen Collection (cultures, biopsies, blood, body fluids, etc.) []  - 0 Specimen(s) / Culture(s) sent or taken to Lab for analysis []  - 0 Patient Transfer (multiple staff / Nurse, adult / Similar devices) []  - 0 Simple Staple / Suture removal (25 or less) []  - 0 Complex Staple / Suture removal (26 or more) []  - 0 Hypo / Hyperglycemic Management (close monitor of Blood Glucose) ANUVA, ANDAZOLA R ([id number]) [id number] Page 3 of 5 []  - 0 Ankle / Brachial Index (ABI) - do not check if billed separately []  - 0 Vital Signs Has the patient been seen at the hospital within the last three years: Yes Total Score: 55 Level Of Care: New/Established - Level 2 Electronic Signature(s) Signed: 09/20/2022 1:10:34 PM By: Thayer Dallas Entered By: Thayer Dallas on 09/20/2022 13:09:58 -------------------------------------------------------------------------------- Encounter Discharge Information Details Patient Name: Date of Service: [name] 09/20/2022 11:30 A M Medical Record Number: [id number] Patient  Account Number: [id number] Date of Birth/Sex: Treating RN: 06/22/1950 (72 y.o. F) Primary Care Shacarra Choe: Jayme Cloud., Mort Sawyers Clinician: Thayer Dallas Referring Hannan Hutmacher: Treating Tadarius Maland/Extender: Allena Katz., [name] in Treatment: 37 Encounter Discharge Information Items Discharge Condition: Stable Ambulatory Status: Walker Discharge Destination: Home Transportation: Private Auto Accompanied By: self Schedule Follow-up Appointment: No Clinical Summary of Care: Electronic Signature(s) Signed: 09/20/2022 1:10:34 PM By: Thayer Dallas Entered By: Thayer Dallas on 09/20/2022 13:09:08 -------------------------------------------------------------------------------- Multi-Disciplinary Care Plan Details Patient Name: Date of Service: [name] 09/20/2022 11:30 A M Medical Record Number: [id number] Patient Account Number: [id number] Date of Birth/Sex: Treating RN: 06-06-1950 (72 y.o. [name], [name] Primary Care Lashae Wollenberg: Jayme Cloud., [name] Other Clinician: Referring Amery Minasyan: Treating Clariece Roesler/Extender: Crissie Figures in Treatment: 37 Multidisciplinary Care Plan reviewed with physician Active Inactive Electronic Signature(s) Signed: 09/26/2022 10:43:16 AM By: Shawn Stall RN, BSN Entered By: Shawn Stall on 09/26/2022 10:43:16 [name] ([id number]) [id number] Page 4 of 5 -------------------------------------------------------------------------------- Patient/Caregiver Education Details Patient Name: Date of Service: [name] 7/12/2024andnbsp11:30 A M Medical Record Number: [id number] Patient Account Number: [id number] Date of Birth/Gender: Treating RN: [birth year] (72 y.o. F) Primary Care Physician: Jayme Cloud., [name] Other Clinician: Thayer Dallas Referring Physician: Treating Physician/Extender: Crissie Figures in Treatment: 37 Education  Assessment Education Provided To: Patient Education Topics Provided Electronic Signature(s) Signed: 09/20/2022 1:10:34 PM By: Thayer Dallas Entered By: Thayer Dallas on 09/20/2022 13:08:37 -------------------------------------------------------------------------------- Wound Assessment Details Patient Name: Date of Service: [name] 09/20/2022 11:30 A M Medical Record Number: [id number] Patient Account Number: [id number] Date of Birth/Sex: Treating RN: 12-21-1950 (72 y.o. F) Primary Care Oreta Soloway: Jayme Cloud., [name] Other Clinician: Referring Raciel Caffrey: Treating Willodean Leven/Extender: Crissie Figures in Treatment: 37 Wound Status Wound Number: 1 Primary Etiology: Pressure Ulcer Wound  Location: Sacrum Wound Status: Open Wounding Event: Pressure Injury Date Acquired: 10/09/2021 Weeks Of Treatment: 37 Clustered Wound: No Wound Measurements Length: (cm) 0.4 Width: (cm) 0.2 Depth: (cm) 0.4 Area: (cm) 0.063 Volume: (cm) 0.025 % Reduction in Area: 82.2% % Reduction in Volume: 89.9% Wound Description Classification: Category/Stage IV Exudate Amount: Medium Exudate Type: Serosanguineous Exudate Color: red, brown Periwound Skin Texture Texture Color No Abnormalities Noted: No No Abnormalities Noted: No Moisture No Abnormalities Noted: No Electronic Signature(s) FLECIA, GREMMINGER R ([id number]) [phone] Page 5 of 5 Signed: 09/20/2022 1:10:34 PM By: Thayer Dallas Entered By: Thayer Dallas on 09/20/2022 13:08:10 -------------------------------------------------------------------------------- Vitals Details Patient Name: Date of Service: [name] 09/20/2022 11:30 A M Medical Record Number: [id number] Patient Account Number: [id number] Date of Birth/Sex: Treating RN: 1950/08/31 (72 y.o. F) Primary Care Nithya Meriweather: Jayme Cloud., [name] Other Clinician: Referring Levell Tavano: Treating Harriet Sutphen/Extender: Crissie Figures in Treatment: 37 Vital Signs Time Taken: 12:15 Reference Range: 80 - 120 mg / dl Height (in): 64 Weight (lbs): 97 Body Mass Index (BMI): 16.6 Electronic Signature(s) Signed: 09/20/2022 1:10:34 PM By: Thayer Dallas Entered By: Thayer Dallas on 09/20/2022 13:07:58

## 2022-09-20 NOTE — Progress Notes (Signed)
Marissa, CONERLY Cardenas ([id number]) [id number] Page 1 of 7 Visit Report for 09/18/2022 Arrival Information Details Patient Name: Date of Service: [name] 09/18/2022 8:45 A M Medical Record Number: [id number] Patient Account Number: [id number] Date of Birth/Sex: Treating RN: [birth year] (72 y.o. F) Primary Care Monzerrath Mcburney: Jayme Cloud., [name] Other Clinician: Referring Morine Kohlman: Treating Pualani Borah/Extender: Crista Curb in Treatment: 37 Visit Information History Since Last Visit All ordered tests and consults were completed: No Patient Arrived: Ambulatory Added or deleted any medications: No Arrival Time: 09:11 Any new allergies or adverse reactions: No Accompanied By: self Had a fall or experienced change in No Transfer Assistance: None activities of daily living that may affect Patient Identification Verified: Yes risk of falls: Secondary Verification Process Completed: Yes Signs or symptoms of abuse/neglect since last visito No Patient Requires Transmission-Based Precautions: No Hospitalized since last visit: No Patient Has Alerts: No Implantable device outside of the clinic excluding No cellular tissue based products placed in the center since last visit: Has Dressing in Place as Prescribed: Yes Pain Present Now: No Electronic Signature(s) Signed: 09/20/2022 1:10:34 PM By: Thayer Dallas Entered By: Thayer Dallas on 09/18/2022 09:12:11 -------------------------------------------------------------------------------- Clinic Level of Care Assessment Details Patient Name: Date of Service: [name] 09/18/2022 8:45 A M Medical Record Number: [id number] Patient Account Number: [id number] Date of Birth/Sex: Treating RN: 1950/12/21 (72 y.o. [name], [name] Primary Care Scottlynn Lindell: Jayme Cloud., [name] Other Clinician: Referring Ennio Houp: Treating Caydn Justen/Extender: Crista Curb in  Treatment: 37 Clinic Level of Care Assessment Items TOOL 4 Quantity Score X- 1 0 Use when only an EandM is performed on FOLLOW-UP visit ASSESSMENTS - Nursing Assessment / Reassessment X- 1 10 Reassessment of Co-morbidities (includes updates in patient status) X- 1 5 Reassessment of Adherence to Treatment Plan ASSESSMENTS - Wound and Skin A ssessment / Reassessment X - Simple Wound Assessment / Reassessment - one wound 1 5 []  - 0 Complex Wound Assessment / Reassessment - multiple wounds []  - 0 Dermatologic / Skin Assessment (not related to wound area) ASSESSMENTS - Focused Assessment []  - 0 Circumferential Edema Measurements - multi extremities []  - 0 Nutritional Assessment / Counseling / Intervention AGIGAIL, NORDINE Cardenas ([id number]) [phone].pdf Page 2 of 7 []  - 0 Lower Extremity Assessment (monofilament, tuning fork, pulses) []  - 0 Peripheral Arterial Disease Assessment (using hand held doppler) ASSESSMENTS - Ostomy and/or Continence Assessment and Care []  - 0 Incontinence Assessment and Management []  - 0 Ostomy Care Assessment and Management (repouching, etc.) PROCESS - Coordination of Care X - Simple Patient / Family Education for ongoing care 1 15 []  - 0 Complex (extensive) Patient / Family Education for ongoing care X- 1 10 Staff obtains Chiropractor, Records, T Results / Process Orders est []  - 0 Staff telephones HHA, Nursing Homes / Clarify orders / etc []  - 0 Routine Transfer to another Facility (non-emergent condition) []  - 0 Routine Hospital Admission (non-emergent condition) []  - 0 New Admissions / Manufacturing engineer / Ordering NPWT Apligraf, etc. , []  - 0 Emergency Hospital Admission (emergent condition) X- 1 10 Simple Discharge Coordination []  - 0 Complex (extensive) Discharge Coordination PROCESS - Special Needs []  - 0 Pediatric / Minor Patient Management []  - 0 Isolation Patient Management []  - 0 Hearing / Language /  Visual special needs []  - 0 Assessment of Community assistance (transportation, D/C planning, etc.) []  - 0 Additional assistance / Altered mentation []  - 0 Support Surface(s) Assessment (bed,  cushion, seat, etc.) INTERVENTIONS - Wound Cleansing / Measurement X - Simple Wound Cleansing - one wound 1 5 []  - 0 Complex Wound Cleansing - multiple wounds X- 1 5 Wound Imaging (photographs - any number of wounds) []  - 0 Wound Tracing (instead of photographs) X- 1 5 Simple Wound Measurement - one wound []  - 0 Complex Wound Measurement - multiple wounds INTERVENTIONS - Wound Dressings X - Small Wound Dressing one or multiple wounds 1 10 []  - 0 Medium Wound Dressing one or multiple wounds []  - 0 Large Wound Dressing one or multiple wounds []  - 0 Application of Medications - topical []  - 0 Application of Medications - injection INTERVENTIONS - Miscellaneous []  - 0 External ear exam []  - 0 Specimen Collection (cultures, biopsies, blood, body fluids, etc.) []  - 0 Specimen(s) / Culture(s) sent or taken to Lab for analysis []  - 0 Patient Transfer (multiple staff / Nurse, adult / Similar devices) []  - 0 Simple Staple / Suture removal (25 or less) []  - 0 Complex Staple / Suture removal (26 or more) []  - 0 Hypo / Hyperglycemic Management (close monitor of Blood Glucose) Wickware, Tiffiney Cardenas ([id number]) [phone].pdf Page 3 of 7 []  - 0 Ankle / Brachial Index (ABI) - do not check if billed separately X- 1 5 Vital Signs Has the patient been seen at the hospital within the last three years: Yes Total Score: 85 Level Of Care: New/Established - Level 3 Electronic Signature(s) Signed: 09/18/2022 6:00:37 PM By: Shawn Stall RN, BSN Entered By: Shawn Stall on 09/18/2022 09:41:50 -------------------------------------------------------------------------------- Encounter Discharge Information Details Patient Name: Date of Service: [name]. 09/18/2022 8:45 A  M Medical Record Number: [id number] Patient Account Number: [id number] Date of Birth/Sex: Treating RN: 05/19/1950 (72 y.o. [name], [name] Primary Care Shanedra Lave: Jayme Cloud., [name] Other Clinician: Referring Wylene Weissman: Treating Isatu Macinnes/Extender: Crista Curb in Treatment: 37 Encounter Discharge Information Items Discharge Condition: Stable Ambulatory Status: Ambulatory Discharge Destination: Home Transportation: Private Auto Accompanied By: son Schedule Follow-up Appointment: Yes Clinical Summary of Care: Electronic Signature(s) Signed: 09/18/2022 6:00:37 PM By: Shawn Stall RN, BSN Entered By: Shawn Stall on 09/18/2022 09:42:19 -------------------------------------------------------------------------------- Lower Extremity Assessment Details Patient Name: Date of Service: [place] Deliah Goody Cardenas. 09/18/2022 8:45 A M Medical Record Number: [id number] Patient Account Number: [id number] Date of Birth/Sex: Treating RN: 1950-12-29 (72 y.o. F) Primary Care Islam Eichinger: Jayme Cloud., [name] Other Clinician: Referring Fitz Matsuo: Treating Jase Reep/Extender: Crista Curb in Treatment: 37 Electronic Signature(s) Signed: 09/20/2022 1:10:34 PM By: Thayer Dallas Entered By: Thayer Dallas on 09/18/2022 09:14:34 -------------------------------------------------------------------------------- Multi-Disciplinary Care Plan Details Patient Name: Date of Service: [address] Brooks Sailors Cardenas. 09/18/2022 8:45 A Tsosie Billing ([id number]) [id number] Page 4 of 7 Medical Record Number: [id number] Patient Account Number: [id number] Date of Birth/Sex: Treating RN: 1950/08/06 (72 y.o. [name], [name] Primary Care Dvora Buitron: Jayme Cloud., [name] Other Clinician: Referring Vernia Teem: Treating Haze Antillon/Extender: Crista Curb in Treatment: 37 Multidisciplinary Care Plan reviewed with physician Active  Inactive Osteomyelitis Nursing Diagnoses: Infection: osteomyelitis Knowledge deficit related to disease process and management Goals: Diagnostic evaluation for osteomyelitis completed as ordered Date Initiated: 07/03/2022 Target Resolution Date: 10/09/2022 Goal Status: Active Patient/caregiver will verbalize understanding of disease process and disease management Date Initiated: 07/03/2022 Target Resolution Date: 10/09/2022 Goal Status: Active Interventions: Assess for signs and symptoms of osteomyelitis resolution every visit Provide education on osteomyelitis Screen for HBO Treatment Activities: Consult for HBO :  07/03/2022 MRI : 06/26/2022 Systemic antibiotics : 07/03/2022 White Blood Cell Scan : 07/03/2022 Notes: Pain, Acute or Chronic Nursing Diagnoses: Pain, acute or chronic: actual or potential Potential alteration in comfort, pain Goals: Patient will verbalize adequate pain control and receive pain control interventions during procedures as needed Date Initiated: 01/02/2022 Target Resolution Date: 10/09/2022 Goal Status: Active Patient/caregiver will verbalize adequate pain control between visits Date Initiated: 01/02/2022 Date Inactivated: 07/17/2022 Target Resolution Date: 07/13/2022 Goal Status: Met Interventions: Encourage patient to take pain medications as prescribed Provide education on pain management Reposition patient for comfort Treatment Activities: Administer pain control measures as ordered : 01/02/2022 Notes: Electronic Signature(s) Signed: 09/18/2022 6:00:37 PM By: Shawn Stall RN, BSN Entered By: Shawn Stall on 09/18/2022 09:32:59 Pain Assessment Details -------------------------------------------------------------------------------- [name] ([id number]) [id number] Page 5 of 7 Patient Name: Date of Service: [name] 09/18/2022 8:45 A M Medical Record Number: [id number] Patient Account Number:  [id number] Date of Birth/Sex: Treating RN: Jun 15, 1950 (72 y.o. F) Primary Care Tejasvi Brissett: Jayme Cloud., [name] Other Clinician: Referring Eleah Lahaie: Treating Irvan Tiedt/Extender: Crista Curb in Treatment: 37 Active Problems Location of Pain Severity and Description of Pain Patient Has Paino No Site Locations Pain Management and Medication Current Pain Management: Electronic Signature(s) Signed: 09/20/2022 1:10:34 PM By: Thayer Dallas Entered By: Thayer Dallas on 09/18/2022 09:14:27 -------------------------------------------------------------------------------- Patient/Caregiver Education Details Patient Name: Date of Service: [name] 7/10/2024andnbsp8:45 A M Medical Record Number: [id number] Patient Account Number: [id number] Date of Birth/Gender: Treating RN: Nov 02, 1950 (72 y.o. [name], [name] Primary Care Physician: Jayme Cloud., [name] Other Clinician: Referring Physician: Treating Physician/Extender: Crista Curb in Treatment: 37 Education Assessment Education Provided To: Patient Education Topics Provided Wound/Skin Impairment: Handouts: Caring for Your Ulcer Methods: Explain/Verbal Responses: Reinforcements needed Electronic Signature(s) Signed: 09/18/2022 6:00:37 PM By: Shawn Stall RN, BSN Entered By: Shawn Stall on 09/18/2022 09:33:19 [name] ([id number]) [id number] Page 6 of 7 -------------------------------------------------------------------------------- Wound Assessment Details Patient Name: Date of Service: [name] 09/18/2022 8:45 A M Medical Record Number: [id number] Patient Account Number: [id number] Date of Birth/Sex: Treating RN: Jun 10, 1950 (72 y.o. F) Primary Care Sladen Plancarte: Jayme Cloud., [name] Other Clinician: Referring Graysen Depaula: Treating Johnmark Geiger/Extender: Crista Curb in Treatment: 37 Wound Status Wound  Number: 1 Primary Etiology: Pressure Ulcer Wound Location: Sacrum Wound Status: Open Wounding Event: Pressure Injury Comorbid History: Colitis Date Acquired: 10/09/2021 Weeks Of Treatment: 37 Clustered Wound: No Photos Wound Measurements Length: (cm) 0.4 Width: (cm) 0.2 Depth: (cm) 0.4 Area: (cm) 0.063 Volume: (cm) 0.025 % Reduction in Area: 82.2% % Reduction in Volume: 89.9% Epithelialization: None Tunneling: No Undermining: Yes Starting Position (o'clock): 9 Ending Position (o'clock): 12 Maximum Distance: (cm) 1.5 Wound Description Classification: Category/Stage IV Exudate Amount: Medium Exudate Type: Serosanguineous Exudate Color: red, brown Foul Odor After Cleansing: No Slough/Fibrino Yes Wound Bed Granulation Amount: Medium (34-66%) Exposed Structure Granulation Quality: Pink Fat Layer (Subcutaneous Tissue) Exposed: Yes Necrotic Amount: Medium (34-66%) Necrotic Quality: Adherent Slough Periwound Skin Texture Texture Color No Abnormalities Noted: No No Abnormalities Noted: No Scarring: Yes Moisture No Abnormalities Noted: No Treatment Notes Wound #1 (Sacrum) Cleanser LINNAE, ANGIOLILLO Cardenas ([id number]) [phone].pdf Page 7 of 7 Normal Saline Discharge Instruction: Cleanse the wound with Normal Saline prior to applying a clean dressing using gauze sponges, not tissue or cotton balls. Vashe 5.8 (oz) Discharge Instruction: Cleanse the wound with Vashe prior to applying a clean dressing using gauze sponges,  not tissue or cotton balls. Peri-Wound Care Skin Prep Discharge Instruction: Use skin prep as directed Topical Primary Dressing Endoform 2x2 in Discharge Instruction: Moisten with saline Secondary Dressing Woven Gauze Sponge, Non-Sterile 4x4 in Discharge Instruction: Apply over primary dressing as directed. Zetuvit Plus Silicone Border Dressing 7x7(in/in) Discharge Instruction: Apply silicone border over primary dressing as  directed. Secured With Compression Wrap Compression Stockings Facilities manager) Signed: 09/20/2022 1:10:34 PM By: Thayer Dallas Entered By: Thayer Dallas on 09/18/2022 09:22:00 -------------------------------------------------------------------------------- Vitals Details Patient Name: Date of Service: [name]. 09/18/2022 8:45 A M Medical Record Number: [id number] Patient Account Number: [id number] Date of Birth/Sex: Treating RN: March 29, 1950 (72 y.o. F) Primary Care Balbina Depace: Jayme Cloud., [name] Other Clinician: Referring Erna Brossard: Treating Dejuan Elman/Extender: Crista Curb in Treatment: 37 Vital Signs Time Taken: 09:13 Temperature (F): 98.3 Height (in): 64 Pulse (bpm): 78 Weight (lbs): 97 Respiratory Rate (breaths/min): 18 Body Mass Index (BMI): 16.6 Blood Pressure (mmHg): 112/51 Reference Range: 80 - 120 mg / dl Electronic Signature(s) Signed: 09/20/2022 1:10:34 PM By: Thayer Dallas Entered By: Thayer Dallas on 09/18/2022 09:14:03

## 2022-09-23 DIAGNOSIS — L89154 Pressure ulcer of sacral region, stage 4: Secondary | ICD-10-CM | POA: Diagnosis not present

## 2022-09-23 DIAGNOSIS — Z79899 Other long term (current) drug therapy: Secondary | ICD-10-CM | POA: Diagnosis not present

## 2022-09-23 DIAGNOSIS — M4628 Osteomyelitis of vertebra, sacral and sacrococcygeal region: Secondary | ICD-10-CM | POA: Diagnosis not present

## 2022-09-24 DIAGNOSIS — M4628 Osteomyelitis of vertebra, sacral and sacrococcygeal region: Secondary | ICD-10-CM | POA: Diagnosis not present

## 2022-09-24 DIAGNOSIS — L89154 Pressure ulcer of sacral region, stage 4: Secondary | ICD-10-CM | POA: Diagnosis not present

## 2022-09-24 DIAGNOSIS — Z79899 Other long term (current) drug therapy: Secondary | ICD-10-CM | POA: Diagnosis not present

## 2022-09-24 NOTE — Progress Notes (Signed)
LEGACY, CARRENDER R ([id number]) [id number] Page 1 of 1 Visit Report for 09/20/2022 SuperBill Details Patient Name: Date of Service: [name] 09/20/2022 Medical Record Number: [id number] Patient Account Number: [id number] Date of Birth/Sex: Treating RN: [birth year] (72 y.o. F) Primary Care Provider: Jayme Cloud., [name] Other Clinician: Referring Provider: Treating Provider/Extender: Crissie Figures in Treatment: 37 Diagnosis Coding ICD-10 Codes Code Description [phone] Pressure ulcer of sacral region, stage 4 M86.48 Chronic osteomyelitis with draining sinus, other site M62.81 Muscle weakness (generalized) E43 Unspecified severe protein-calorie malnutrition Facility Procedures CPT4 Code Description Modifier Quantity [id number] [phone] - WOUND CARE VISIT-LEV 2 EST PT 1 Electronic Signature(s) Signed: 09/20/2022 1:10:34 PM By: Thayer Dallas Signed: 09/24/2022 4:51:26 PM By: Geralyn Corwin DO Entered By: Thayer Dallas on 09/20/2022 13:10:12

## 2022-09-25 ENCOUNTER — Telehealth: Payer: Self-pay

## 2022-09-25 NOTE — Telephone Encounter (Signed)
Entered in error

## 2022-09-27 DIAGNOSIS — G4700 Insomnia, unspecified: Secondary | ICD-10-CM | POA: Diagnosis not present

## 2022-09-27 DIAGNOSIS — K861 Other chronic pancreatitis: Secondary | ICD-10-CM | POA: Diagnosis not present

## 2022-09-27 DIAGNOSIS — E2740 Unspecified adrenocortical insufficiency: Secondary | ICD-10-CM | POA: Diagnosis not present

## 2022-09-27 DIAGNOSIS — Z79899 Other long term (current) drug therapy: Secondary | ICD-10-CM | POA: Diagnosis not present

## 2022-09-27 DIAGNOSIS — M542 Cervicalgia: Secondary | ICD-10-CM | POA: Diagnosis not present

## 2022-09-27 DIAGNOSIS — Z681 Body mass index (BMI) 19 or less, adult: Secondary | ICD-10-CM | POA: Diagnosis not present

## 2022-09-27 DIAGNOSIS — R64 Cachexia: Secondary | ICD-10-CM | POA: Diagnosis not present

## 2022-09-27 DIAGNOSIS — F32A Depression, unspecified: Secondary | ICD-10-CM | POA: Diagnosis not present

## 2022-09-30 DIAGNOSIS — Z792 Long term (current) use of antibiotics: Secondary | ICD-10-CM | POA: Diagnosis not present

## 2022-09-30 DIAGNOSIS — F0392 Unspecified dementia, unspecified severity, with psychotic disturbance: Secondary | ICD-10-CM | POA: Diagnosis not present

## 2022-09-30 DIAGNOSIS — G8929 Other chronic pain: Secondary | ICD-10-CM | POA: Diagnosis not present

## 2022-09-30 DIAGNOSIS — K2100 Gastro-esophageal reflux disease with esophagitis, without bleeding: Secondary | ICD-10-CM | POA: Diagnosis not present

## 2022-09-30 DIAGNOSIS — L89159 Pressure ulcer of sacral region, unspecified stage: Secondary | ICD-10-CM | POA: Diagnosis not present

## 2022-09-30 DIAGNOSIS — F17210 Nicotine dependence, cigarettes, uncomplicated: Secondary | ICD-10-CM | POA: Diagnosis not present

## 2022-09-30 DIAGNOSIS — G894 Chronic pain syndrome: Secondary | ICD-10-CM | POA: Diagnosis not present

## 2022-09-30 DIAGNOSIS — A498 Other bacterial infections of unspecified site: Secondary | ICD-10-CM | POA: Diagnosis not present

## 2022-09-30 DIAGNOSIS — M868X8 Other osteomyelitis, other site: Secondary | ICD-10-CM | POA: Diagnosis not present

## 2022-09-30 DIAGNOSIS — M8668 Other chronic osteomyelitis, other site: Secondary | ICD-10-CM | POA: Diagnosis not present

## 2022-09-30 DIAGNOSIS — M6281 Muscle weakness (generalized): Secondary | ICD-10-CM | POA: Diagnosis not present

## 2022-09-30 DIAGNOSIS — T148XXA Other injury of unspecified body region, initial encounter: Secondary | ICD-10-CM | POA: Diagnosis not present

## 2022-09-30 DIAGNOSIS — Z452 Encounter for adjustment and management of vascular access device: Secondary | ICD-10-CM | POA: Diagnosis not present

## 2022-09-30 DIAGNOSIS — E876 Hypokalemia: Secondary | ICD-10-CM | POA: Diagnosis not present

## 2022-09-30 DIAGNOSIS — Z8661 Personal history of infections of the central nervous system: Secondary | ICD-10-CM | POA: Diagnosis not present

## 2022-09-30 DIAGNOSIS — K219 Gastro-esophageal reflux disease without esophagitis: Secondary | ICD-10-CM | POA: Diagnosis not present

## 2022-09-30 DIAGNOSIS — L89156 Pressure-induced deep tissue damage of sacral region: Secondary | ICD-10-CM | POA: Diagnosis not present

## 2022-09-30 DIAGNOSIS — I10 Essential (primary) hypertension: Secondary | ICD-10-CM | POA: Diagnosis not present

## 2022-09-30 DIAGNOSIS — M4638 Infection of intervertebral disc (pyogenic), sacral and sacrococcygeal region: Secondary | ICD-10-CM | POA: Diagnosis not present

## 2022-09-30 DIAGNOSIS — Z48817 Encounter for surgical aftercare following surgery on the skin and subcutaneous tissue: Secondary | ICD-10-CM | POA: Diagnosis not present

## 2022-09-30 DIAGNOSIS — J449 Chronic obstructive pulmonary disease, unspecified: Secondary | ICD-10-CM | POA: Diagnosis not present

## 2022-09-30 DIAGNOSIS — B9689 Other specified bacterial agents as the cause of diseases classified elsewhere: Secondary | ICD-10-CM | POA: Diagnosis not present

## 2022-09-30 DIAGNOSIS — M8628 Subacute osteomyelitis, other site: Secondary | ICD-10-CM | POA: Diagnosis not present

## 2022-09-30 DIAGNOSIS — Z79899 Other long term (current) drug therapy: Secondary | ICD-10-CM | POA: Diagnosis not present

## 2022-09-30 DIAGNOSIS — L089 Local infection of the skin and subcutaneous tissue, unspecified: Secondary | ICD-10-CM | POA: Diagnosis not present

## 2022-09-30 DIAGNOSIS — L89154 Pressure ulcer of sacral region, stage 4: Secondary | ICD-10-CM | POA: Diagnosis not present

## 2022-09-30 DIAGNOSIS — S31000S Unspecified open wound of lower back and pelvis without penetration into retroperitoneum, sequela: Secondary | ICD-10-CM | POA: Diagnosis not present

## 2022-10-02 ENCOUNTER — Ambulatory Visit: Payer: 59 | Admitting: Orthopedic Surgery

## 2022-10-07 DIAGNOSIS — E876 Hypokalemia: Secondary | ICD-10-CM | POA: Diagnosis not present

## 2022-10-07 DIAGNOSIS — G4700 Insomnia, unspecified: Secondary | ICD-10-CM | POA: Diagnosis not present

## 2022-10-07 DIAGNOSIS — Z452 Encounter for adjustment and management of vascular access device: Secondary | ICD-10-CM | POA: Diagnosis not present

## 2022-10-07 DIAGNOSIS — L89154 Pressure ulcer of sacral region, stage 4: Secondary | ICD-10-CM | POA: Diagnosis not present

## 2022-10-07 DIAGNOSIS — L89156 Pressure-induced deep tissue damage of sacral region: Secondary | ICD-10-CM | POA: Diagnosis not present

## 2022-10-07 DIAGNOSIS — S31000S Unspecified open wound of lower back and pelvis without penetration into retroperitoneum, sequela: Secondary | ICD-10-CM | POA: Diagnosis not present

## 2022-10-07 DIAGNOSIS — M8648 Chronic osteomyelitis with draining sinus, other site: Secondary | ICD-10-CM | POA: Diagnosis not present

## 2022-10-07 DIAGNOSIS — Z09 Encounter for follow-up examination after completed treatment for conditions other than malignant neoplasm: Secondary | ICD-10-CM | POA: Diagnosis not present

## 2022-10-07 DIAGNOSIS — F0392 Unspecified dementia, unspecified severity, with psychotic disturbance: Secondary | ICD-10-CM | POA: Diagnosis not present

## 2022-10-07 DIAGNOSIS — I10 Essential (primary) hypertension: Secondary | ICD-10-CM | POA: Diagnosis not present

## 2022-10-07 DIAGNOSIS — L089 Local infection of the skin and subcutaneous tissue, unspecified: Secondary | ICD-10-CM | POA: Diagnosis not present

## 2022-10-07 DIAGNOSIS — Z5181 Encounter for therapeutic drug level monitoring: Secondary | ICD-10-CM | POA: Diagnosis not present

## 2022-10-07 DIAGNOSIS — M4638 Infection of intervertebral disc (pyogenic), sacral and sacrococcygeal region: Secondary | ICD-10-CM | POA: Diagnosis not present

## 2022-10-07 DIAGNOSIS — S31000A Unspecified open wound of lower back and pelvis without penetration into retroperitoneum, initial encounter: Secondary | ICD-10-CM | POA: Diagnosis not present

## 2022-10-07 DIAGNOSIS — M4628 Osteomyelitis of vertebra, sacral and sacrococcygeal region: Secondary | ICD-10-CM | POA: Diagnosis not present

## 2022-10-07 DIAGNOSIS — K2100 Gastro-esophageal reflux disease with esophagitis, without bleeding: Secondary | ICD-10-CM | POA: Diagnosis not present

## 2022-10-07 DIAGNOSIS — K219 Gastro-esophageal reflux disease without esophagitis: Secondary | ICD-10-CM | POA: Diagnosis not present

## 2022-10-07 DIAGNOSIS — G8929 Other chronic pain: Secondary | ICD-10-CM | POA: Diagnosis not present

## 2022-10-07 DIAGNOSIS — R5381 Other malaise: Secondary | ICD-10-CM | POA: Diagnosis not present

## 2022-10-07 DIAGNOSIS — L89159 Pressure ulcer of sacral region, unspecified stage: Secondary | ICD-10-CM | POA: Diagnosis not present

## 2022-10-07 DIAGNOSIS — B9689 Other specified bacterial agents as the cause of diseases classified elsewhere: Secondary | ICD-10-CM | POA: Diagnosis not present

## 2022-10-07 DIAGNOSIS — D649 Anemia, unspecified: Secondary | ICD-10-CM | POA: Diagnosis not present

## 2022-10-07 DIAGNOSIS — Z4889 Encounter for other specified surgical aftercare: Secondary | ICD-10-CM | POA: Diagnosis not present

## 2022-10-07 DIAGNOSIS — I82621 Acute embolism and thrombosis of deep veins of right upper extremity: Secondary | ICD-10-CM | POA: Diagnosis not present

## 2022-10-07 DIAGNOSIS — Z95828 Presence of other vascular implants and grafts: Secondary | ICD-10-CM | POA: Diagnosis not present

## 2022-10-07 DIAGNOSIS — R239 Unspecified skin changes: Secondary | ICD-10-CM | POA: Diagnosis not present

## 2022-10-07 DIAGNOSIS — Z9889 Other specified postprocedural states: Secondary | ICD-10-CM | POA: Diagnosis not present

## 2022-10-07 DIAGNOSIS — G894 Chronic pain syndrome: Secondary | ICD-10-CM | POA: Diagnosis not present

## 2022-10-07 DIAGNOSIS — Z945 Skin transplant status: Secondary | ICD-10-CM | POA: Diagnosis not present

## 2022-10-07 DIAGNOSIS — Z48817 Encounter for surgical aftercare following surgery on the skin and subcutaneous tissue: Secondary | ICD-10-CM | POA: Diagnosis not present

## 2022-10-07 DIAGNOSIS — M6281 Muscle weakness (generalized): Secondary | ICD-10-CM | POA: Diagnosis not present

## 2022-10-07 DIAGNOSIS — J449 Chronic obstructive pulmonary disease, unspecified: Secondary | ICD-10-CM | POA: Diagnosis not present

## 2022-10-07 DIAGNOSIS — R278 Other lack of coordination: Secondary | ICD-10-CM | POA: Diagnosis not present

## 2022-10-07 DIAGNOSIS — F17210 Nicotine dependence, cigarettes, uncomplicated: Secondary | ICD-10-CM | POA: Diagnosis not present

## 2022-10-07 DIAGNOSIS — M7989 Other specified soft tissue disorders: Secondary | ICD-10-CM | POA: Diagnosis not present

## 2022-10-07 DIAGNOSIS — Z792 Long term (current) use of antibiotics: Secondary | ICD-10-CM | POA: Diagnosis not present

## 2022-10-08 DIAGNOSIS — M4628 Osteomyelitis of vertebra, sacral and sacrococcygeal region: Secondary | ICD-10-CM | POA: Diagnosis not present

## 2022-10-08 DIAGNOSIS — Z945 Skin transplant status: Secondary | ICD-10-CM | POA: Diagnosis not present

## 2022-10-08 DIAGNOSIS — L89154 Pressure ulcer of sacral region, stage 4: Secondary | ICD-10-CM | POA: Diagnosis not present

## 2022-10-08 DIAGNOSIS — Z9889 Other specified postprocedural states: Secondary | ICD-10-CM | POA: Diagnosis not present

## 2022-10-08 DIAGNOSIS — M8648 Chronic osteomyelitis with draining sinus, other site: Secondary | ICD-10-CM | POA: Diagnosis not present

## 2022-10-09 DIAGNOSIS — Z4889 Encounter for other specified surgical aftercare: Secondary | ICD-10-CM | POA: Diagnosis not present

## 2022-10-09 DIAGNOSIS — M6281 Muscle weakness (generalized): Secondary | ICD-10-CM | POA: Diagnosis not present

## 2022-10-09 DIAGNOSIS — L89154 Pressure ulcer of sacral region, stage 4: Secondary | ICD-10-CM | POA: Diagnosis not present

## 2022-10-09 DIAGNOSIS — R239 Unspecified skin changes: Secondary | ICD-10-CM | POA: Diagnosis not present

## 2022-10-09 DIAGNOSIS — R278 Other lack of coordination: Secondary | ICD-10-CM | POA: Diagnosis not present

## 2022-10-10 DIAGNOSIS — F17210 Nicotine dependence, cigarettes, uncomplicated: Secondary | ICD-10-CM | POA: Diagnosis not present

## 2022-10-10 DIAGNOSIS — Z48817 Encounter for surgical aftercare following surgery on the skin and subcutaneous tissue: Secondary | ICD-10-CM | POA: Diagnosis not present

## 2022-10-10 DIAGNOSIS — B9689 Other specified bacterial agents as the cause of diseases classified elsewhere: Secondary | ICD-10-CM | POA: Diagnosis not present

## 2022-10-10 DIAGNOSIS — M6281 Muscle weakness (generalized): Secondary | ICD-10-CM | POA: Diagnosis not present

## 2022-10-10 DIAGNOSIS — G894 Chronic pain syndrome: Secondary | ICD-10-CM | POA: Diagnosis not present

## 2022-10-10 DIAGNOSIS — S31000S Unspecified open wound of lower back and pelvis without penetration into retroperitoneum, sequela: Secondary | ICD-10-CM | POA: Diagnosis not present

## 2022-10-10 DIAGNOSIS — L89154 Pressure ulcer of sacral region, stage 4: Secondary | ICD-10-CM | POA: Diagnosis not present

## 2022-10-10 DIAGNOSIS — M4628 Osteomyelitis of vertebra, sacral and sacrococcygeal region: Secondary | ICD-10-CM | POA: Diagnosis not present

## 2022-10-10 DIAGNOSIS — I10 Essential (primary) hypertension: Secondary | ICD-10-CM | POA: Diagnosis not present

## 2022-10-10 DIAGNOSIS — G8929 Other chronic pain: Secondary | ICD-10-CM | POA: Diagnosis not present

## 2022-10-10 DIAGNOSIS — M8648 Chronic osteomyelitis with draining sinus, other site: Secondary | ICD-10-CM | POA: Diagnosis not present

## 2022-10-10 DIAGNOSIS — Z452 Encounter for adjustment and management of vascular access device: Secondary | ICD-10-CM | POA: Diagnosis not present

## 2022-10-10 DIAGNOSIS — E876 Hypokalemia: Secondary | ICD-10-CM | POA: Diagnosis not present

## 2022-10-10 DIAGNOSIS — Z5181 Encounter for therapeutic drug level monitoring: Secondary | ICD-10-CM | POA: Diagnosis not present

## 2022-10-10 DIAGNOSIS — L89159 Pressure ulcer of sacral region, unspecified stage: Secondary | ICD-10-CM | POA: Diagnosis not present

## 2022-10-10 DIAGNOSIS — S31000A Unspecified open wound of lower back and pelvis without penetration into retroperitoneum, initial encounter: Secondary | ICD-10-CM | POA: Diagnosis not present

## 2022-10-10 DIAGNOSIS — K2100 Gastro-esophageal reflux disease with esophagitis, without bleeding: Secondary | ICD-10-CM | POA: Diagnosis not present

## 2022-10-10 DIAGNOSIS — D649 Anemia, unspecified: Secondary | ICD-10-CM | POA: Diagnosis not present

## 2022-10-10 DIAGNOSIS — L089 Local infection of the skin and subcutaneous tissue, unspecified: Secondary | ICD-10-CM | POA: Diagnosis not present

## 2022-10-10 DIAGNOSIS — Z09 Encounter for follow-up examination after completed treatment for conditions other than malignant neoplasm: Secondary | ICD-10-CM | POA: Diagnosis not present

## 2022-10-10 DIAGNOSIS — M4638 Infection of intervertebral disc (pyogenic), sacral and sacrococcygeal region: Secondary | ICD-10-CM | POA: Diagnosis not present

## 2022-10-10 DIAGNOSIS — Z792 Long term (current) use of antibiotics: Secondary | ICD-10-CM | POA: Diagnosis not present

## 2022-10-10 DIAGNOSIS — G4700 Insomnia, unspecified: Secondary | ICD-10-CM | POA: Diagnosis not present

## 2022-10-10 DIAGNOSIS — I82621 Acute embolism and thrombosis of deep veins of right upper extremity: Secondary | ICD-10-CM | POA: Diagnosis not present

## 2022-10-10 DIAGNOSIS — K219 Gastro-esophageal reflux disease without esophagitis: Secondary | ICD-10-CM | POA: Diagnosis not present

## 2022-10-10 DIAGNOSIS — Z9889 Other specified postprocedural states: Secondary | ICD-10-CM | POA: Diagnosis not present

## 2022-10-10 DIAGNOSIS — R5381 Other malaise: Secondary | ICD-10-CM | POA: Diagnosis not present

## 2022-10-10 DIAGNOSIS — M7989 Other specified soft tissue disorders: Secondary | ICD-10-CM | POA: Diagnosis not present

## 2022-10-10 DIAGNOSIS — Z95828 Presence of other vascular implants and grafts: Secondary | ICD-10-CM | POA: Diagnosis not present

## 2022-10-10 DIAGNOSIS — J449 Chronic obstructive pulmonary disease, unspecified: Secondary | ICD-10-CM | POA: Diagnosis not present

## 2022-10-11 DIAGNOSIS — S31000A Unspecified open wound of lower back and pelvis without penetration into retroperitoneum, initial encounter: Secondary | ICD-10-CM | POA: Diagnosis not present

## 2022-10-11 DIAGNOSIS — G8929 Other chronic pain: Secondary | ICD-10-CM | POA: Diagnosis not present

## 2022-10-11 DIAGNOSIS — L089 Local infection of the skin and subcutaneous tissue, unspecified: Secondary | ICD-10-CM | POA: Diagnosis not present

## 2022-10-11 DIAGNOSIS — J449 Chronic obstructive pulmonary disease, unspecified: Secondary | ICD-10-CM | POA: Diagnosis not present

## 2022-10-11 DIAGNOSIS — I10 Essential (primary) hypertension: Secondary | ICD-10-CM | POA: Diagnosis not present

## 2022-10-11 DIAGNOSIS — R5381 Other malaise: Secondary | ICD-10-CM | POA: Diagnosis not present

## 2022-10-11 DIAGNOSIS — K219 Gastro-esophageal reflux disease without esophagitis: Secondary | ICD-10-CM | POA: Diagnosis not present

## 2022-10-15 DIAGNOSIS — S31000A Unspecified open wound of lower back and pelvis without penetration into retroperitoneum, initial encounter: Secondary | ICD-10-CM | POA: Diagnosis not present

## 2022-10-15 DIAGNOSIS — L089 Local infection of the skin and subcutaneous tissue, unspecified: Secondary | ICD-10-CM | POA: Diagnosis not present

## 2022-10-15 DIAGNOSIS — I10 Essential (primary) hypertension: Secondary | ICD-10-CM | POA: Diagnosis not present

## 2022-10-15 DIAGNOSIS — R5381 Other malaise: Secondary | ICD-10-CM | POA: Diagnosis not present

## 2022-10-15 DIAGNOSIS — J449 Chronic obstructive pulmonary disease, unspecified: Secondary | ICD-10-CM | POA: Diagnosis not present

## 2022-10-15 DIAGNOSIS — K219 Gastro-esophageal reflux disease without esophagitis: Secondary | ICD-10-CM | POA: Diagnosis not present

## 2022-10-15 DIAGNOSIS — G8929 Other chronic pain: Secondary | ICD-10-CM | POA: Diagnosis not present

## 2022-10-16 DIAGNOSIS — D649 Anemia, unspecified: Secondary | ICD-10-CM | POA: Diagnosis not present

## 2022-10-16 DIAGNOSIS — Z5181 Encounter for therapeutic drug level monitoring: Secondary | ICD-10-CM | POA: Diagnosis not present

## 2022-10-21 DIAGNOSIS — I82621 Acute embolism and thrombosis of deep veins of right upper extremity: Secondary | ICD-10-CM | POA: Diagnosis not present

## 2022-10-21 DIAGNOSIS — Z5181 Encounter for therapeutic drug level monitoring: Secondary | ICD-10-CM | POA: Diagnosis not present

## 2022-10-21 DIAGNOSIS — I10 Essential (primary) hypertension: Secondary | ICD-10-CM | POA: Diagnosis not present

## 2022-10-21 DIAGNOSIS — G4700 Insomnia, unspecified: Secondary | ICD-10-CM | POA: Diagnosis not present

## 2022-10-21 DIAGNOSIS — M7989 Other specified soft tissue disorders: Secondary | ICD-10-CM | POA: Diagnosis not present

## 2022-10-25 DIAGNOSIS — J449 Chronic obstructive pulmonary disease, unspecified: Secondary | ICD-10-CM | POA: Diagnosis not present

## 2022-11-01 DIAGNOSIS — I10 Essential (primary) hypertension: Secondary | ICD-10-CM | POA: Diagnosis not present

## 2022-11-01 DIAGNOSIS — J449 Chronic obstructive pulmonary disease, unspecified: Secondary | ICD-10-CM | POA: Diagnosis not present

## 2022-11-07 DIAGNOSIS — I10 Essential (primary) hypertension: Secondary | ICD-10-CM | POA: Diagnosis not present

## 2022-11-07 DIAGNOSIS — L089 Local infection of the skin and subcutaneous tissue, unspecified: Secondary | ICD-10-CM | POA: Diagnosis not present

## 2022-11-07 DIAGNOSIS — G4700 Insomnia, unspecified: Secondary | ICD-10-CM | POA: Diagnosis not present

## 2022-11-07 DIAGNOSIS — D649 Anemia, unspecified: Secondary | ICD-10-CM | POA: Diagnosis not present

## 2022-11-07 DIAGNOSIS — I82621 Acute embolism and thrombosis of deep veins of right upper extremity: Secondary | ICD-10-CM | POA: Diagnosis not present

## 2022-11-07 DIAGNOSIS — S31000A Unspecified open wound of lower back and pelvis without penetration into retroperitoneum, initial encounter: Secondary | ICD-10-CM | POA: Diagnosis not present

## 2022-11-07 DIAGNOSIS — J449 Chronic obstructive pulmonary disease, unspecified: Secondary | ICD-10-CM | POA: Diagnosis not present

## 2022-11-08 DIAGNOSIS — Z95828 Presence of other vascular implants and grafts: Secondary | ICD-10-CM | POA: Diagnosis not present

## 2022-11-08 DIAGNOSIS — M8648 Chronic osteomyelitis with draining sinus, other site: Secondary | ICD-10-CM | POA: Diagnosis not present

## 2022-11-08 DIAGNOSIS — L89154 Pressure ulcer of sacral region, stage 4: Secondary | ICD-10-CM | POA: Diagnosis not present

## 2022-11-08 DIAGNOSIS — Z9889 Other specified postprocedural states: Secondary | ICD-10-CM | POA: Diagnosis not present

## 2022-11-08 DIAGNOSIS — Z09 Encounter for follow-up examination after completed treatment for conditions other than malignant neoplasm: Secondary | ICD-10-CM | POA: Diagnosis not present

## 2022-11-08 DIAGNOSIS — M4628 Osteomyelitis of vertebra, sacral and sacrococcygeal region: Secondary | ICD-10-CM | POA: Diagnosis not present

## 2022-11-08 DIAGNOSIS — Z792 Long term (current) use of antibiotics: Secondary | ICD-10-CM | POA: Diagnosis not present

## 2022-11-26 DIAGNOSIS — G4700 Insomnia, unspecified: Secondary | ICD-10-CM | POA: Diagnosis not present

## 2022-11-26 DIAGNOSIS — M542 Cervicalgia: Secondary | ICD-10-CM | POA: Diagnosis not present

## 2022-11-26 DIAGNOSIS — R030 Elevated blood-pressure reading, without diagnosis of hypertension: Secondary | ICD-10-CM | POA: Diagnosis not present

## 2022-11-26 DIAGNOSIS — Z681 Body mass index (BMI) 19 or less, adult: Secondary | ICD-10-CM | POA: Diagnosis not present

## 2022-11-26 DIAGNOSIS — Z79899 Other long term (current) drug therapy: Secondary | ICD-10-CM | POA: Diagnosis not present

## 2022-11-26 DIAGNOSIS — E2740 Unspecified adrenocortical insufficiency: Secondary | ICD-10-CM | POA: Diagnosis not present

## 2022-11-26 DIAGNOSIS — R64 Cachexia: Secondary | ICD-10-CM | POA: Diagnosis not present

## 2022-11-26 DIAGNOSIS — K861 Other chronic pancreatitis: Secondary | ICD-10-CM | POA: Diagnosis not present

## 2022-11-28 DIAGNOSIS — Z79899 Other long term (current) drug therapy: Secondary | ICD-10-CM | POA: Diagnosis not present

## 2022-12-01 ENCOUNTER — Inpatient Hospital Stay (HOSPITAL_COMMUNITY)
Admission: EM | Admit: 2022-12-01 | Discharge: 2022-12-05 | DRG: 190 | Disposition: A | Discharge status: Skilled Nursing Facility | Payer: 59 | Source: Skilled Nursing Facility | Attending: Family Medicine | Admitting: Family Medicine

## 2022-12-01 ENCOUNTER — Other Ambulatory Visit: Payer: Self-pay

## 2022-12-01 ENCOUNTER — Emergency Department (HOSPITAL_COMMUNITY): Payer: 59

## 2022-12-01 ENCOUNTER — Encounter (HOSPITAL_COMMUNITY): Payer: Self-pay | Admitting: *Deleted

## 2022-12-01 DIAGNOSIS — R109 Unspecified abdominal pain: Secondary | ICD-10-CM | POA: Diagnosis present

## 2022-12-01 DIAGNOSIS — I2699 Other pulmonary embolism without acute cor pulmonale: Secondary | ICD-10-CM | POA: Diagnosis not present

## 2022-12-01 DIAGNOSIS — N281 Cyst of kidney, acquired: Secondary | ICD-10-CM | POA: Diagnosis not present

## 2022-12-01 DIAGNOSIS — R531 Weakness: Secondary | ICD-10-CM | POA: Diagnosis not present

## 2022-12-01 DIAGNOSIS — R7989 Other specified abnormal findings of blood chemistry: Secondary | ICD-10-CM | POA: Diagnosis not present

## 2022-12-01 DIAGNOSIS — M797 Fibromyalgia: Secondary | ICD-10-CM | POA: Diagnosis present

## 2022-12-01 DIAGNOSIS — F1120 Opioid dependence, uncomplicated: Secondary | ICD-10-CM | POA: Insufficient documentation

## 2022-12-01 DIAGNOSIS — J9601 Acute respiratory failure with hypoxia: Secondary | ICD-10-CM | POA: Diagnosis not present

## 2022-12-01 DIAGNOSIS — B3781 Candidal esophagitis: Secondary | ICD-10-CM | POA: Diagnosis not present

## 2022-12-01 DIAGNOSIS — R197 Diarrhea, unspecified: Secondary | ICD-10-CM | POA: Diagnosis present

## 2022-12-01 DIAGNOSIS — K861 Other chronic pancreatitis: Secondary | ICD-10-CM

## 2022-12-01 DIAGNOSIS — Z885 Allergy status to narcotic agent status: Secondary | ICD-10-CM

## 2022-12-01 DIAGNOSIS — K838 Other specified diseases of biliary tract: Secondary | ICD-10-CM | POA: Diagnosis not present

## 2022-12-01 DIAGNOSIS — F419 Anxiety disorder, unspecified: Secondary | ICD-10-CM | POA: Diagnosis present

## 2022-12-01 DIAGNOSIS — Z1152 Encounter for screening for COVID-19: Secondary | ICD-10-CM | POA: Diagnosis not present

## 2022-12-01 DIAGNOSIS — Z90710 Acquired absence of both cervix and uterus: Secondary | ICD-10-CM

## 2022-12-01 DIAGNOSIS — K8689 Other specified diseases of pancreas: Secondary | ICD-10-CM | POA: Diagnosis not present

## 2022-12-01 DIAGNOSIS — D62 Acute posthemorrhagic anemia: Secondary | ICD-10-CM | POA: Diagnosis not present

## 2022-12-01 DIAGNOSIS — K5730 Diverticulosis of large intestine without perforation or abscess without bleeding: Secondary | ICD-10-CM | POA: Diagnosis not present

## 2022-12-01 DIAGNOSIS — R079 Chest pain, unspecified: Secondary | ICD-10-CM | POA: Diagnosis not present

## 2022-12-01 DIAGNOSIS — K219 Gastro-esophageal reflux disease without esophagitis: Secondary | ICD-10-CM | POA: Diagnosis not present

## 2022-12-01 DIAGNOSIS — E440 Moderate protein-calorie malnutrition: Secondary | ICD-10-CM | POA: Insufficient documentation

## 2022-12-01 DIAGNOSIS — R0902 Hypoxemia: Secondary | ICD-10-CM | POA: Diagnosis not present

## 2022-12-01 DIAGNOSIS — B370 Candidal stomatitis: Secondary | ICD-10-CM | POA: Diagnosis not present

## 2022-12-01 DIAGNOSIS — M1990 Unspecified osteoarthritis, unspecified site: Secondary | ICD-10-CM | POA: Diagnosis present

## 2022-12-01 DIAGNOSIS — J9602 Acute respiratory failure with hypercapnia: Secondary | ICD-10-CM | POA: Diagnosis present

## 2022-12-01 DIAGNOSIS — Z820 Family history of epilepsy and other diseases of the nervous system: Secondary | ICD-10-CM

## 2022-12-01 DIAGNOSIS — Z681 Body mass index (BMI) 19 or less, adult: Secondary | ICD-10-CM

## 2022-12-01 DIAGNOSIS — R6889 Other general symptoms and signs: Secondary | ICD-10-CM | POA: Diagnosis not present

## 2022-12-01 DIAGNOSIS — R0602 Shortness of breath: Secondary | ICD-10-CM | POA: Diagnosis not present

## 2022-12-01 DIAGNOSIS — Z9049 Acquired absence of other specified parts of digestive tract: Secondary | ICD-10-CM

## 2022-12-01 DIAGNOSIS — Z79899 Other long term (current) drug therapy: Secondary | ICD-10-CM

## 2022-12-01 DIAGNOSIS — Z7951 Long term (current) use of inhaled steroids: Secondary | ICD-10-CM

## 2022-12-01 DIAGNOSIS — R7401 Elevation of levels of liver transaminase levels: Secondary | ICD-10-CM | POA: Diagnosis not present

## 2022-12-01 DIAGNOSIS — J441 Chronic obstructive pulmonary disease with (acute) exacerbation: Principal | ICD-10-CM | POA: Diagnosis present

## 2022-12-01 DIAGNOSIS — F17210 Nicotine dependence, cigarettes, uncomplicated: Secondary | ICD-10-CM | POA: Diagnosis not present

## 2022-12-01 DIAGNOSIS — R110 Nausea: Secondary | ICD-10-CM | POA: Diagnosis not present

## 2022-12-01 DIAGNOSIS — R10A1 Flank pain, right side: Secondary | ICD-10-CM | POA: Diagnosis present

## 2022-12-01 DIAGNOSIS — Z8249 Family history of ischemic heart disease and other diseases of the circulatory system: Secondary | ICD-10-CM | POA: Diagnosis not present

## 2022-12-01 DIAGNOSIS — Z8673 Personal history of transient ischemic attack (TIA), and cerebral infarction without residual deficits: Secondary | ICD-10-CM | POA: Diagnosis not present

## 2022-12-01 DIAGNOSIS — Z8661 Personal history of infections of the central nervous system: Secondary | ICD-10-CM

## 2022-12-01 DIAGNOSIS — Z8042 Family history of malignant neoplasm of prostate: Secondary | ICD-10-CM

## 2022-12-01 DIAGNOSIS — Z720 Tobacco use: Secondary | ICD-10-CM | POA: Diagnosis not present

## 2022-12-01 DIAGNOSIS — Z743 Need for continuous supervision: Secondary | ICD-10-CM | POA: Diagnosis not present

## 2022-12-01 DIAGNOSIS — I10 Essential (primary) hypertension: Secondary | ICD-10-CM | POA: Diagnosis not present

## 2022-12-01 DIAGNOSIS — K921 Melena: Secondary | ICD-10-CM | POA: Diagnosis not present

## 2022-12-01 DIAGNOSIS — Z886 Allergy status to analgesic agent status: Secondary | ICD-10-CM

## 2022-12-01 DIAGNOSIS — E876 Hypokalemia: Secondary | ICD-10-CM | POA: Diagnosis not present

## 2022-12-01 DIAGNOSIS — F32A Depression, unspecified: Secondary | ICD-10-CM | POA: Diagnosis present

## 2022-12-01 DIAGNOSIS — K862 Cyst of pancreas: Secondary | ICD-10-CM | POA: Diagnosis not present

## 2022-12-01 DIAGNOSIS — R4189 Other symptoms and signs involving cognitive functions and awareness: Secondary | ICD-10-CM | POA: Diagnosis present

## 2022-12-01 DIAGNOSIS — K922 Gastrointestinal hemorrhage, unspecified: Secondary | ICD-10-CM | POA: Diagnosis not present

## 2022-12-01 LAB — CBC WITH DIFFERENTIAL/PLATELET
Abs Immature Granulocytes: 0.05 10*3/uL (ref 0.00–0.07)
Basophils Absolute: 0.1 10*3/uL (ref 0.0–0.1)
Basophils Relative: 1 %
Eosinophils Absolute: 0.1 10*3/uL (ref 0.0–0.5)
Eosinophils Relative: 1 %
HCT: 45.4 % (ref 36.0–46.0)
Hemoglobin: 14.2 g/dL (ref 12.0–15.0)
Immature Granulocytes: 0 %
Lymphocytes Relative: 11 %
Lymphs Abs: 1.4 10*3/uL (ref 0.7–4.0)
MCH: 29.3 pg (ref 26.0–34.0)
MCHC: 31.3 g/dL (ref 30.0–36.0)
MCV: 93.8 fL (ref 80.0–100.0)
Monocytes Absolute: 1.1 10*3/uL — ABNORMAL HIGH (ref 0.1–1.0)
Monocytes Relative: 9 %
Neutro Abs: 10.4 10*3/uL — ABNORMAL HIGH (ref 1.7–7.7)
Neutrophils Relative %: 78 %
Platelets: 308 10*3/uL (ref 150–400)
RBC: 4.84 MIL/uL (ref 3.87–5.11)
RDW: 13.8 % (ref 11.5–15.5)
WBC: 13.1 10*3/uL — ABNORMAL HIGH (ref 4.0–10.5)
nRBC: 0 % (ref 0.0–0.2)

## 2022-12-01 LAB — RESP PANEL BY RT-PCR (RSV, FLU A&B, COVID)  RVPGX2
Influenza A by PCR: NEGATIVE
Influenza B by PCR: NEGATIVE
Resp Syncytial Virus by PCR: NEGATIVE
SARS Coronavirus 2 by RT PCR: NEGATIVE

## 2022-12-01 LAB — URINALYSIS, ROUTINE W REFLEX MICROSCOPIC
Bilirubin Urine: NEGATIVE
Glucose, UA: NEGATIVE mg/dL
Hgb urine dipstick: NEGATIVE
Ketones, ur: 20 mg/dL — AB
Leukocytes,Ua: NEGATIVE
Nitrite: NEGATIVE
Protein, ur: 30 mg/dL — AB
Specific Gravity, Urine: 1.016 (ref 1.005–1.030)
pH: 5 (ref 5.0–8.0)

## 2022-12-01 LAB — COMPREHENSIVE METABOLIC PANEL
ALT: 611 U/L — ABNORMAL HIGH (ref 0–44)
AST: 215 U/L — ABNORMAL HIGH (ref 15–41)
Albumin: 3.5 g/dL (ref 3.5–5.0)
Alkaline Phosphatase: 376 U/L — ABNORMAL HIGH (ref 38–126)
Anion gap: 12 (ref 5–15)
BUN: 29 mg/dL — ABNORMAL HIGH (ref 8–23)
CO2: 30 mmol/L (ref 22–32)
Calcium: 9.4 mg/dL (ref 8.9–10.3)
Chloride: 98 mmol/L (ref 98–111)
Creatinine, Ser: 0.73 mg/dL (ref 0.44–1.00)
GFR, Estimated: >60 mL/min (ref >60)
Glucose, Bld: 69 mg/dL — ABNORMAL LOW (ref 70–99)
Potassium: 2.9 mmol/L — ABNORMAL LOW (ref 3.5–5.1)
Sodium: 140 mmol/L (ref 135–145)
Total Bilirubin: 1.2 mg/dL (ref 0.3–1.2)
Total Protein: 6.8 g/dL (ref 6.5–8.1)

## 2022-12-01 LAB — CBG MONITORING, ED
Glucose-Capillary: 58 mg/dL — ABNORMAL LOW (ref 70–99)
Glucose-Capillary: 77 mg/dL (ref 70–99)

## 2022-12-01 LAB — LIPASE, BLOOD: Lipase: 60 U/L — ABNORMAL HIGH (ref 11–51)

## 2022-12-01 MED ORDER — POTASSIUM CHLORIDE CRYS ER 20 MEQ PO TBCR
40.0000 meq | EXTENDED_RELEASE_TABLET | Freq: Once | ORAL | Status: AC
  Administered 2022-12-01: 40 meq via ORAL
  Filled 2022-12-01: qty 2

## 2022-12-01 MED ORDER — IOHEXOL 350 MG/ML SOLN
75.0000 mL | Freq: Once | INTRAVENOUS | Status: AC | PRN
  Administered 2022-12-01: 75 mL via INTRAVENOUS

## 2022-12-01 MED ORDER — POTASSIUM CHLORIDE 10 MEQ/100ML IV SOLN
10.0000 meq | INTRAVENOUS | Status: AC
  Administered 2022-12-01 (×2): 10 meq via INTRAVENOUS
  Filled 2022-12-01: qty 100

## 2022-12-01 MED ORDER — NYSTATIN 100000 UNIT/ML MT SUSP: 5.0000 mL | Freq: Four times a day (QID) | OROMUCOSAL | 0 refills | Status: DC

## 2022-12-01 MED ORDER — LACTATED RINGERS IV BOLUS
500.0000 mL | Freq: Once | INTRAVENOUS | Status: AC
  Administered 2022-12-01: 500 mL via INTRAVENOUS

## 2022-12-01 NOTE — ED Provider Notes (Signed)
[place] EMERGENCY DEPARTMENT AT [facility] Provider Note   CSN: [id number] Arrival date & time: 12/01/22  1305     History  Chief Complaint  Patient presents with   Weakness    [name] is a 72 y.o. female.  Comes the ER for with decreased p.o. intake for the past couple days, states she feels weak all over, no chest pain or shortness of breath, mild abdominal pain in the right side.  No fevers or chills, no cough, no runny nose, chest pain She does have history of COPD.  Weakness      Home Medications Prior to Admission medications   Medication Sig Start Date End Date Taking? Authorizing Provider  nystatin (MYCOSTATIN) 100000 UNIT/ML suspension Take 5 mLs (500,000 Units total) by mouth 4 (four) times daily for 14 days. Swish and spit 12/01/22 12/15/22 Yes Briasia Flinders A, PA-C  acetaminophen (TYLENOL) 500 MG tablet Take 2 tablets (1,000 mg total) by mouth every 6 (six) hours as needed for mild pain. 11/15/20   Mancel Bale, MD  albuterol (VENTOLIN HFA) 108 (90 Base) MCG/ACT inhaler Inhale 1-2 puffs into the lungs every 6 (six) hours as needed for wheezing or shortness of breath. 02/28/22   [provider]  ARIPiprazole (ABILIFY) 5 MG tablet Take 5 mg by mouth daily. 02/14/22   [provider]  buprenorphine (SUBUTEX) 2 MG SUBL SL tablet Place 2 tablets (4 mg total) under the tongue every 6 (six) hours as needed (pain). 03/08/22   Sherryll Burger, Pratik D, DO  carvedilol (COREG) 6.25 MG tablet Take 1 tablet (6.25 mg total) by mouth 2 (two) times daily with a meal. 11/15/20   Mancel Bale, MD  DHEA 10 MG CAPS Take 1 capsule by mouth daily.    [provider]  diclofenac Sodium (VOLTAREN) 1 % GEL Apply 2 g topically 4 (four) times daily. 09/18/22   Carmel Sacramento A, PA-C  donepezil (ARICEPT) 10 MG tablet Take 10 mg by mouth daily. 02/15/22   [provider]  doxepin (SINEQUAN) 50 MG capsule Take 1 capsule (50 mg total) by mouth daily.  03/08/22   Sherryll Burger, Pratik D, DO  GOODSENSE IBUPROFEN 200 MG tablet Take 200 mg by mouth daily. 02/14/22   [provider]  Iodine Strong, Lug[place], (STRONG IODINE) 5 % solution Take 1 mL by mouth 3 (three) times a week. 12/10/21   [provider]  LORazepam (ATIVAN) 0.5 MG tablet Take 1 tablet (0.5 mg total) by mouth 2 (two) times daily as needed for anxiety. 03/08/22   Sherryll Burger, Pratik D, DO  losartan (COZAAR) 100 MG tablet Take 100 mg by mouth daily. 02/15/22   [provider]  mirtazapine (REMERON) 7.5 MG tablet Take 7.5 mg by mouth at bedtime. 02/20/22   [provider]  naloxone [facility]) nasal spray 4 mg/0.1 mL Place 1 spray into the nose once. 12/04/21   [provider]  Nutritional Supplements (ENSURE ORIGINAL) LIQD Take 1 Can by mouth in the morning, at noon, in the evening, and at bedtime. 02/01/22   [provider]  OLANZapine (ZYPREXA) 10 MG tablet Take 1 tablet (10 mg total) by mouth at bedtime. 03/08/22   Sherryll Burger, Pratik D, DO  temazepam (RESTORIL) 22.5 MG capsule Take 1 capsule (22.5 mg total) by mouth at bedtime. 03/08/22   Sherryll Burger, Pratik D, DO  traMADol (ULTRAM) 50 MG tablet Take 1 tablet (50 mg total) by mouth every 4 (four) hours as needed for moderate pain. 03/08/22  Sherryll Burger, Pratik D, DO  TRELEGY ELLIPTA 100-62.5-25 MCG/ACT AEPB Inhale 1 puff into the lungs daily. 02/28/22   [provider]  VITAMIN A PO Take 1 capsule by mouth 3 (three) times a week.    [provider]  Zinc Sulfate 220 (50 Zn) MG TABS Take 0.5 tablets by mouth 3 (three) times a week. 02/14/22   [provider]      Allergies    Tylenol [acetaminophen], Codeine, Nsaids, Tolmetin, and Tramadol    Review of Systems   Review of Systems  Neurological:  Positive for weakness.    Physical Exam Updated Vital Signs BP (!) 163/66 (BP Location: Left Arm)   Pulse 82   Temp 97.8 F (36.6 C) (Axillary)   Resp 14   Wt 40.8 kg   SpO2 98%   BMI 15.94  kg/m  Physical Exam Vitals and nursing note reviewed.  Constitutional:      General: She is not in acute distress.    Appearance: She is well-developed.  HENT:     Head: Normocephalic and atraumatic.     Nose: Nose normal.     Mouth/Throat:     Mouth: Mucous membranes are dry.     Comments: White plaques to tongue Eyes:     Conjunctiva/sclera: Conjunctivae normal.  Cardiovascular:     Rate and Rhythm: Normal rate and regular rhythm.     Heart sounds: No murmur heard. Pulmonary:     Effort: Pulmonary effort is normal. No respiratory distress.     Breath sounds: Normal breath sounds.  Abdominal:     Palpations: Abdomen is soft.     Tenderness: There is no abdominal tenderness.  Musculoskeletal:        General: No swelling.     Cervical back: Neck supple.  Skin:    General: Skin is warm and dry.     Capillary Refill: Capillary refill takes less than 2 seconds.  Neurological:     General: No focal deficit present.     Mental Status: She is alert and oriented to person, place, and time.  Psychiatric:        Mood and Affect: Mood normal.     ED Results / Procedures / Treatments   Labs (all labs ordered are listed, but only abnormal results are displayed) Labs Reviewed  CBC WITH DIFFERENTIAL/PLATELET - Abnormal; Notable for the following components:      Result Value   WBC 13.1 (*)    Neutro Abs 10.4 (*)    Monocytes Absolute 1.1 (*)    All other components within normal limits  COMPREHENSIVE METABOLIC PANEL - Abnormal; Notable for the following components:   Potassium 2.9 (*)    Glucose, Bld 69 (*)    BUN 29 (*)    AST 215 (*)    ALT 611 (*)    Alkaline Phosphatase 376 (*)    All other components within normal limits  URINALYSIS, ROUTINE W REFLEX MICROSCOPIC - Abnormal; Notable for the following components:   APPearance CLOUDY (*)    Ketones, ur 20 (*)    Protein, ur 30 (*)    Bacteria, UA RARE (*)    All other components within normal limits  LIPASE, BLOOD -  Abnormal; Notable for the following components:   Lipase 60 (*)    All other components within normal limits  CBG MONITORING, ED - Abnormal; Notable for the following components:   Glucose-Capillary 58 (*)    All other components within normal limits  RESP PANEL BY RT-PCR (RSV, FLU A&B, COVID)  RVPGX2  HEPATITIS PANEL, ACUTE  CBG MONITORING, ED  CBG MONITORING, ED    EKG EKG Interpretation Date/Time:  Sunday December 01 2022 13:27:24 EDT Ventricular Rate:  74 PR Interval:  137 QRS Duration:  98 QT Interval:  404 QTC Calculation: 449 R Axis:   43  Text Interpretation: Sinus rhythm Anteroseptal infarct, age indeterminate Nonspecific T abnormalities, lateral leads Confirmed by Eber Hong ([id number]) on 12/01/2022 5:31:03 PM  Radiology CT ABDOMEN PELVIS W CONTRAST  Result Date: 12/01/2022 CLINICAL DATA:  Diffuse abdominal pain and anorexia for 3 days. Elevated liver function tests. EXAM: CT ABDOMEN AND PELVIS WITH CONTRAST TECHNIQUE: Multidetector CT imaging of the abdomen and pelvis was performed using the standard protocol following bolus administration of intravenous contrast. RADIATION DOSE REDUCTION: This exam was performed according to the departmental dose-optimization program which includes automated exposure control, adjustment of the mA and/or kV according to patient size and/or use of iterative reconstruction technique. CONTRAST:  75 mL OMNIPAQUE IOHEXOL 350 MG/ML SOLN COMPARISON:  12/17/2020 FINDINGS: Lower Chest: No acute findings. Hepatobiliary: No suspicious hepatic masses identified. Prior cholecystectomy again noted. Diffuse biliary ductal dilatation remains unchanged. Pancreas: No evidence of pancreatic mass or acute pancreatitis. Scattered tiny pancreatic calcifications are noted, consistent with chronic pancreatitis. No evidence of peripancreatic fluid collections. Spleen: Within normal limits in size and appearance. Adrenals/Urinary Tract: Multiple small benign-appearing  renal cysts are again seen bilaterally (No followup imaging is recommended). No suspicious masses identified. No evidence of ureteral calculi or hydronephrosis. Stomach/Bowel: No evidence of obstruction, inflammatory process or abnormal fluid collections. Diverticulosis is seen mainly involving the sigmoid colon, however there is no evidence of diverticulitis. Vascular/Lymphatic: No pathologically enlarged lymph nodes. No acute vascular findings. Reproductive: Prior hysterectomy noted. Adnexal regions are unremarkable in appearance. Other:  None. Musculoskeletal: No suspicious bone lesions identified. Multiple lower thoracic and lumbar vertebral body compression fractures are seen, which are chronic in appearance IMPRESSION: No acute findings within the abdomen or pelvis. Prior cholecystectomy. Stable chronic diffuse biliary ductal dilatation. Chronic pancreatitis. Colonic diverticulosis, without radiographic evidence of diverticulitis. Electronically Signed   By: Danae Orleans M.D.   On: 12/01/2022 16:51   CT Angio Chest PE W and/or Wo Contrast  Result Date: 12/01/2022 CLINICAL DATA:  Chest pain and weakness shortness of breath. Probability for pulmonary embolism. EXAM: CT ANGIOGRAPHY CHEST WITH CONTRAST TECHNIQUE: Multidetector CT imaging of the chest was performed using the standard protocol during bolus administration of intravenous contrast. Multiplanar CT image reconstructions and MIPs were obtained to evaluate the vascular anatomy. RADIATION DOSE REDUCTION: This exam was performed according to the departmental dose-optimization program which includes automated exposure control, adjustment of the mA and/or kV according to patient size and/or use of iterative reconstruction technique. CONTRAST:  75 mL OMNIPAQUE IOHEXOL 350 MG/ML SOLN COMPARISON:  None Available. FINDINGS: Cardiovascular: Satisfactory opacification of pulmonary arteries noted, and no pulmonary emboli identified. No evidence of thoracic aortic  dissection or aneurysm. Mediastinum/Nodes: No masses or pathologically enlarged lymph nodes identified. Lungs/Pleura: No pulmonary mass, infiltrate, or effusion. Upper abdomen: No acute findings. Musculoskeletal: No suspicious bone lesions identified. Review of the MIP images confirms the above findings. IMPRESSION: Negative. No evidence of pulmonary embolism or other acute findings. Electronically Signed   By: Danae Orleans M.D.   On: 12/01/2022 16:43    Procedures Procedures    Medications Ordered in ED Medications  lactated ringers bolus 500 mL (0 mLs Intravenous Stopped 12/01/22 1624)  potassium  chloride 10 mEq in 100 mL IVPB (0 mEq Intravenous Stopped 12/01/22 1904)  iohexol (OMNIPAQUE) 350 MG/ML injection 75 mL (75 mLs Intravenous Contrast Given 12/01/22 1600)  potassium chloride SA (KLOR-CON M) CR tablet 40 mEq (40 mEq Oral Given 12/01/22 1747)    ED Course/ Medical Decision Making/ A&P Clinical Course as of 12/01/22 1927  Sun Dec 01, 2022  1822 From skilled nursing recovering from surgery for a sacral decubitus ulcer presents for decreased p.o. intake and generalized weakness, noted to have LFT elevation, also mild hypoxia likely due to her COPD, out of abundance of precaution got CTA chest and CT abdomen pelvis, no acute findings, she has surgically absent gallbladder and has some bile duct dilatation likely postsurgical, bilirubin is not elevated.  She is not on a statin, not taking Tylenol.  She has very mild right upper quadrant pain but her exam is overall reassuring.  No open wounds on exam.  She does have thrush on her tongue.  Will treat with nystatin.  Sugar slightly low, potassium also low likely due to decreased p.o. intake.  Will recheck after she eats and drinks.  She is getting potassium replacement.  I spatially discharged home after she gets remainder of potassium and sugar is improved.  She will need close GI follow-up and PCP follow-up [CB]    Clinical Course User Index [CB]  Ma Rings, PA-C                                 Medical Decision Making This patient presents to the ED for concern of generalized weakness, decreased p.o. intake, this involves an extensive number of treatment options, and is a complaint that carries with it a high risk of complications and morbidity.  The differential diagnosis includes the hydration, electrolyte abnormality, sepsis, UTI, other   Co morbidities that complicate the patient evaluation  COPD, healing sacral decubitus ulcer   Additional history obtained:  Additional history obtained from EMR External records from outside source obtained and reviewed including notes   Lab Tests:  I Ordered, and personally interpreted labs.  The pertinent results include: CBC shows mild leukocytosis 13.1, CMP shows hypokalemia 2.9, glucose decreased at 69, BUN slightly elevated at 29 and elevated AST, ALT and alk phos   Imaging Studies ordered:  I ordered imaging studies including CTA chest which shows no PE, no pneumonia or acute findings, CT abdomen pelvis shows intrapectoral dilatation, chronic pancreatitis,  I independently visualized and interpreted imaging I agree with the radiologist interpretation     Problem List / ED Course / Critical interventions / Medication management  Patient here with mild dehydration, hypokalemia which is repleted, mild hypoglycemia which resolved with crackers and cola, and transaminitis.  Has thrush which is likely secondary to her recent antibiotics from her sacral wound.  Treatments.  Advised on small frequent meals, oral hydration and GI follow-up for the transaminitis.  She is overall well-appearing and feel she is safe to go home.  Also seen by my attending Dr. Hyacinth Meeker.  Patient is agreeable with this.  I did send a message to Dr. Marletta Lor for GI to see if his office can reach out to the nursing home to schedule close outpatient follow-up.  I have reviewed the patients home medicines and  have made adjustments as needed   Amount and/or Complexity of Data Reviewed Labs: ordered. Radiology: ordered.  Risk Prescription drug management.  Final Clinical Impression(s) / ED Diagnoses Final diagnoses:  Generalized weakness  Hypokalemia  Thrush, oral    Rx / DC Orders ED Discharge Orders          Ordered    nystatin (MYCOSTATIN) 100000 UNIT/ML suspension  4 times daily        12/01/22 1912              Josem Kaufmann 12/01/22 1927    Eber Hong, MD 12/02/22 [phone]

## 2022-12-01 NOTE — ED Provider Notes (Signed)
Patient coming from skilled nursing facility in [facility], she has a history of a sacral decubitus which is almost completely healed, I personally examined her back and there is no signs of open wounds.  She has a temperature which is 99.2 free of fever, she is not tachycardic, she is not hypoxic, she is not hypotensive.  Her abdomen is minimally tender on the right and her LFTs are elevated consistent with a transaminitis from an unknown cause.  CT scan otherwise reassuring, status post cholecystectomy, she does not have a elevated bilirubin, she does have a slight leukocytosis, she has a lipase which is barely outside of the normal range at 60 and she is hypokalemic.  She is getting potassium replacement, urine shows ketones but no infection, she will need repeat blood sugar, CT scan imaging reassuring otherwise, stable for discharge after IV fluids and potassium given.  She does have thrush and will be prescribed nystatin  Medical screening examination/treatment/procedure(s) were conducted as a shared visit with non-physician practitioner(s) and myself.  I personally evaluated the patient during the encounter.  Clinical Impression:   Final diagnoses:  None         Eber Hong, MD 12/02/22 [phone]

## 2022-12-01 NOTE — ED Notes (Signed)
EDP at [facility] 

## 2022-12-01 NOTE — ED Notes (Signed)
BS low 58, given apple sauce and soda.

## 2022-12-01 NOTE — ED Triage Notes (Signed)
BIB [facility] from SNF/ [facility], here for general weakness and decreased PO intake over last 3 days. Denies CP, sob, NVD, fever, dizziness. Mentions some low back pain. Recent week long admission at [facility] with ID of sacral wound. A&Ox4. VSS. ABT completed 1-2 weeks ago. CBG 79. Lethargic, able to hold conversation. Lips chapped.

## 2022-12-01 NOTE — ED Notes (Signed)
Notified [facility] of patient needing transportation back to group home.

## 2022-12-01 NOTE — Discharge Instructions (Addendum)
Seen today for generalized weakness.  You seem to be mildly dehydrated, your test were also slightly elevated.  We ordered some additional blood work to check for infection such as hepatitis.  You will need to follow-up closely with the GI doctor.  Call tomorrow to schedule appointment.  Make sure you are eating and drinking well.  Your blood sugar was slightly low.  You need to make sure you are eating enough throughout the day.  You also have thrush in your mouth and we are prescribing nystatin to treat this infection.  Follow-up with your doctor for this.

## 2022-12-01 NOTE — ED Notes (Signed)
Delay in K runs. Pumps requested.

## 2022-12-01 NOTE — ED Notes (Signed)
Pt finished with BP, and repositioned. Safety bed monitor placed. Fall risk socks and arm band applied.

## 2022-12-01 NOTE — ED Notes (Signed)
Lab at [facility]. KCL IV run infusing, VSS, sleepy, arousable to voice, NAD, calm, interactive.

## 2022-12-02 ENCOUNTER — Inpatient Hospital Stay (HOSPITAL_COMMUNITY): Payer: 59

## 2022-12-02 DIAGNOSIS — R7989 Other specified abnormal findings of blood chemistry: Secondary | ICD-10-CM | POA: Diagnosis not present

## 2022-12-02 DIAGNOSIS — K219 Gastro-esophageal reflux disease without esophagitis: Secondary | ICD-10-CM | POA: Diagnosis present

## 2022-12-02 DIAGNOSIS — Z820 Family history of epilepsy and other diseases of the nervous system: Secondary | ICD-10-CM | POA: Diagnosis not present

## 2022-12-02 DIAGNOSIS — Z8042 Family history of malignant neoplasm of prostate: Secondary | ICD-10-CM | POA: Diagnosis not present

## 2022-12-02 DIAGNOSIS — F17210 Nicotine dependence, cigarettes, uncomplicated: Secondary | ICD-10-CM | POA: Diagnosis present

## 2022-12-02 DIAGNOSIS — E440 Moderate protein-calorie malnutrition: Secondary | ICD-10-CM | POA: Diagnosis present

## 2022-12-02 DIAGNOSIS — Z681 Body mass index (BMI) 19 or less, adult: Secondary | ICD-10-CM | POA: Diagnosis not present

## 2022-12-02 DIAGNOSIS — M797 Fibromyalgia: Secondary | ICD-10-CM | POA: Diagnosis present

## 2022-12-02 DIAGNOSIS — F1120 Opioid dependence, uncomplicated: Secondary | ICD-10-CM | POA: Diagnosis present

## 2022-12-02 DIAGNOSIS — Z8249 Family history of ischemic heart disease and other diseases of the circulatory system: Secondary | ICD-10-CM | POA: Diagnosis not present

## 2022-12-02 DIAGNOSIS — F419 Anxiety disorder, unspecified: Secondary | ICD-10-CM | POA: Diagnosis present

## 2022-12-02 DIAGNOSIS — J9601 Acute respiratory failure with hypoxia: Secondary | ICD-10-CM | POA: Diagnosis present

## 2022-12-02 DIAGNOSIS — F32A Depression, unspecified: Secondary | ICD-10-CM | POA: Diagnosis present

## 2022-12-02 DIAGNOSIS — R7401 Elevation of levels of liver transaminase levels: Secondary | ICD-10-CM | POA: Diagnosis not present

## 2022-12-02 DIAGNOSIS — Z720 Tobacco use: Secondary | ICD-10-CM

## 2022-12-02 DIAGNOSIS — I10 Essential (primary) hypertension: Secondary | ICD-10-CM | POA: Diagnosis present

## 2022-12-02 DIAGNOSIS — E876 Hypokalemia: Secondary | ICD-10-CM | POA: Diagnosis present

## 2022-12-02 DIAGNOSIS — K838 Other specified diseases of biliary tract: Secondary | ICD-10-CM | POA: Diagnosis not present

## 2022-12-02 DIAGNOSIS — B3781 Candidal esophagitis: Secondary | ICD-10-CM | POA: Diagnosis present

## 2022-12-02 DIAGNOSIS — J441 Chronic obstructive pulmonary disease with (acute) exacerbation: Secondary | ICD-10-CM

## 2022-12-02 DIAGNOSIS — K861 Other chronic pancreatitis: Secondary | ICD-10-CM | POA: Diagnosis present

## 2022-12-02 DIAGNOSIS — Z1152 Encounter for screening for COVID-19: Secondary | ICD-10-CM | POA: Diagnosis not present

## 2022-12-02 DIAGNOSIS — K862 Cyst of pancreas: Secondary | ICD-10-CM | POA: Diagnosis present

## 2022-12-02 DIAGNOSIS — Z8673 Personal history of transient ischemic attack (TIA), and cerebral infarction without residual deficits: Secondary | ICD-10-CM | POA: Diagnosis not present

## 2022-12-02 DIAGNOSIS — J9602 Acute respiratory failure with hypercapnia: Secondary | ICD-10-CM | POA: Diagnosis present

## 2022-12-02 DIAGNOSIS — R109 Unspecified abdominal pain: Secondary | ICD-10-CM | POA: Diagnosis not present

## 2022-12-02 DIAGNOSIS — R531 Weakness: Secondary | ICD-10-CM | POA: Diagnosis present

## 2022-12-02 DIAGNOSIS — B370 Candidal stomatitis: Secondary | ICD-10-CM | POA: Diagnosis present

## 2022-12-02 DIAGNOSIS — Z9049 Acquired absence of other specified parts of digestive tract: Secondary | ICD-10-CM | POA: Diagnosis not present

## 2022-12-02 DIAGNOSIS — Z8661 Personal history of infections of the central nervous system: Secondary | ICD-10-CM | POA: Diagnosis not present

## 2022-12-02 DIAGNOSIS — N281 Cyst of kidney, acquired: Secondary | ICD-10-CM | POA: Diagnosis not present

## 2022-12-02 LAB — RESPIRATORY PANEL BY PCR

## 2022-12-02 LAB — CBC WITH DIFFERENTIAL/PLATELET
Abs Immature Granulocytes: 0.03 10*3/uL (ref 0.00–0.07)
Basophils Absolute: 0 10*3/uL (ref 0.0–0.1)
Basophils Relative: 0 %
Eosinophils Absolute: 0 10*3/uL (ref 0.0–0.5)
Eosinophils Relative: 0 %
HCT: 39.8 % (ref 36.0–46.0)
Hemoglobin: 12.7 g/dL (ref 12.0–15.0)
Immature Granulocytes: 0 %
Lymphocytes Relative: 11 %
Lymphs Abs: 1 10*3/uL (ref 0.7–4.0)
MCH: 29.5 pg (ref 26.0–34.0)
MCHC: 31.9 g/dL (ref 30.0–36.0)
MCV: 92.6 fL (ref 80.0–100.0)
Monocytes Absolute: 0.8 10*3/uL (ref 0.1–1.0)
Monocytes Relative: 8 %
Neutro Abs: 7.3 10*3/uL (ref 1.7–7.7)
Neutrophils Relative %: 81 %
Platelets: 298 10*3/uL (ref 150–400)
RBC: 4.3 MIL/uL (ref 3.87–5.11)
RDW: 13.8 % (ref 11.5–15.5)
WBC: 9.2 10*3/uL (ref 4.0–10.5)
nRBC: 0 % (ref 0.0–0.2)

## 2022-12-02 LAB — TROPONIN I (HIGH SENSITIVITY)
Troponin I (High Sensitivity): 8 ng/L (ref <18)
Troponin I (High Sensitivity): 7 ng/L (ref <18)

## 2022-12-02 LAB — IRON AND TIBC
Iron: 12 ug/dL — ABNORMAL LOW (ref 28–170)
Saturation Ratios: 5 % — ABNORMAL LOW (ref 10.4–31.8)
TIBC: 240 ug/dL — ABNORMAL LOW (ref 250–450)
UIBC: 228 ug/dL

## 2022-12-02 LAB — BLOOD GAS, VENOUS
Acid-Base Excess: 6.4 mmol/L — ABNORMAL HIGH (ref 0.0–2.0)
Bicarbonate: 33.5 mmol/L — ABNORMAL HIGH (ref 20.0–28.0)
Drawn by: 44828
O2 Saturation: 98.4 %
Patient temperature: 37.3
pCO2, Ven: 59 mmHg (ref 44–60)
pH, Ven: 7.37 (ref 7.25–7.43)
pO2, Ven: 80 mmHg — ABNORMAL HIGH (ref 32–45)

## 2022-12-02 LAB — COMPREHENSIVE METABOLIC PANEL
ALT: 347 U/L — ABNORMAL HIGH (ref 0–44)
AST: 77 U/L — ABNORMAL HIGH (ref 15–41)
Albumin: 3.1 g/dL — ABNORMAL LOW (ref 3.5–5.0)
Alkaline Phosphatase: 283 U/L — ABNORMAL HIGH (ref 38–126)
Anion gap: 12 (ref 5–15)
BUN: 20 mg/dL (ref 8–23)
CO2: 29 mmol/L (ref 22–32)
Calcium: 9.1 mg/dL (ref 8.9–10.3)
Chloride: 102 mmol/L (ref 98–111)
Creatinine, Ser: 0.67 mg/dL (ref 0.44–1.00)
GFR, Estimated: >60 mL/min (ref >60)
Glucose, Bld: 70 mg/dL (ref 70–99)
Potassium: 3.4 mmol/L — ABNORMAL LOW (ref 3.5–5.1)
Sodium: 143 mmol/L (ref 135–145)
Total Bilirubin: 1 mg/dL (ref 0.3–1.2)
Total Protein: 6 g/dL — ABNORMAL LOW (ref 6.5–8.1)

## 2022-12-02 LAB — PROTIME-INR
INR: 1.1 (ref 0.8–1.2)
Prothrombin Time: 14.5 seconds (ref 11.4–15.2)

## 2022-12-02 LAB — PROCALCITONIN: Procalcitonin: 0.67 ng/mL

## 2022-12-02 LAB — HEPATITIS PANEL, ACUTE
HCV Ab: NONREACTIVE
Hep A IgM: NONREACTIVE
Hep B C IgM: NONREACTIVE
Hepatitis B Surface Ag: NONREACTIVE

## 2022-12-02 LAB — LACTIC ACID, PLASMA
Lactic Acid, Venous: 0.6 mmol/L (ref 0.5–1.9)
Lactic Acid, Venous: 0.6 mmol/L (ref 0.5–1.9)

## 2022-12-02 LAB — CK: Total CK: 43 U/L (ref 38–234)

## 2022-12-02 LAB — FOLATE: Folate: 31.4 ng/mL (ref >5.9)

## 2022-12-02 LAB — MAGNESIUM: Magnesium: 1.8 mg/dL (ref 1.7–2.4)

## 2022-12-02 LAB — TSH: TSH: 0.305 u[IU]/mL — ABNORMAL LOW (ref 0.350–4.500)

## 2022-12-02 LAB — T4, FREE: Free T4: 1.37 ng/dL — ABNORMAL HIGH (ref 0.61–1.12)

## 2022-12-02 LAB — VITAMIN B12: Vitamin B-12: 921 pg/mL — ABNORMAL HIGH (ref 180–914)

## 2022-12-02 LAB — FERRITIN: Ferritin: 189 ng/mL (ref 11–307)

## 2022-12-02 MED ORDER — CARVEDILOL 3.125 MG PO TABS
6.2500 mg | ORAL_TABLET | Freq: Two times a day (BID) | ORAL | Status: DC
  Administered 2022-12-02 – 2022-12-05 (×6): 6.25 mg via ORAL
  Filled 2022-12-02 (×6): qty 2

## 2022-12-02 MED ORDER — DONEPEZIL HCL 5 MG PO TABS
10.0000 mg | ORAL_TABLET | Freq: Every day | ORAL | Status: DC
  Administered 2022-12-02 – 2022-12-04 (×3): 10 mg via ORAL
  Filled 2022-12-02 (×3): qty 2

## 2022-12-02 MED ORDER — ARFORMOTEROL TARTRATE 15 MCG/2ML IN NEBU
15.0000 ug | INHALATION_SOLUTION | Freq: Two times a day (BID) | RESPIRATORY_TRACT | Status: DC
  Administered 2022-12-02 – 2022-12-05 (×6): 15 ug via RESPIRATORY_TRACT
  Filled 2022-12-02 (×6): qty 2

## 2022-12-02 MED ORDER — OXYCODONE HCL 5 MG PO TABS
2.5000 mg | ORAL_TABLET | Freq: Four times a day (QID) | ORAL | Status: DC | PRN
  Administered 2022-12-02 – 2022-12-04 (×3): 2.5 mg via ORAL
  Filled 2022-12-02 (×3): qty 1

## 2022-12-02 MED ORDER — PROCHLORPERAZINE EDISYLATE 10 MG/2ML IJ SOLN: 10.0000 mg | Freq: Four times a day (QID) | INTRAMUSCULAR | Status: DC | PRN

## 2022-12-02 MED ORDER — ENOXAPARIN SODIUM 40 MG/0.4ML IJ SOSY
40.0000 mg | PREFILLED_SYRINGE | INTRAMUSCULAR | Status: DC
  Administered 2022-12-02 – 2022-12-04 (×3): 40 mg via SUBCUTANEOUS
  Filled 2022-12-02 (×3): qty 0.4

## 2022-12-02 MED ORDER — IPRATROPIUM-ALBUTEROL 0.5-2.5 (3) MG/3ML IN SOLN
3.0000 mL | Freq: Three times a day (TID) | RESPIRATORY_TRACT | Status: DC
  Administered 2022-12-03 – 2022-12-05 (×7): 3 mL via RESPIRATORY_TRACT
  Filled 2022-12-02 (×7): qty 3

## 2022-12-02 MED ORDER — ENSURE ENLIVE PO LIQD
237.0000 mL | Freq: Two times a day (BID) | ORAL | Status: DC
  Administered 2022-12-02 – 2022-12-03 (×2): 237 mL via ORAL

## 2022-12-02 MED ORDER — POTASSIUM CHLORIDE CRYS ER 20 MEQ PO TBCR
20.0000 meq | EXTENDED_RELEASE_TABLET | Freq: Once | ORAL | Status: AC
  Administered 2022-12-02: 20 meq via ORAL
  Filled 2022-12-02: qty 1

## 2022-12-02 MED ORDER — BUDESONIDE 0.5 MG/2ML IN SUSP
0.5000 mg | Freq: Two times a day (BID) | RESPIRATORY_TRACT | Status: DC
  Administered 2022-12-02 – 2022-12-05 (×6): 0.5 mg via RESPIRATORY_TRACT
  Filled 2022-12-02 (×6): qty 2

## 2022-12-02 MED ORDER — IPRATROPIUM-ALBUTEROL 0.5-2.5 (3) MG/3ML IN SOLN
3.0000 mL | Freq: Four times a day (QID) | RESPIRATORY_TRACT | Status: DC
  Administered 2022-12-02: 3 mL via RESPIRATORY_TRACT
  Filled 2022-12-02: qty 3

## 2022-12-02 MED ORDER — ARIPIPRAZOLE 5 MG PO TABS
5.0000 mg | ORAL_TABLET | Freq: Every day | ORAL | Status: DC
  Administered 2022-12-02 – 2022-12-05 (×4): 5 mg via ORAL
  Filled 2022-12-02 (×4): qty 1

## 2022-12-02 MED ORDER — GADOBUTROL 1 MMOL/ML IV SOLN
5.0000 mL | Freq: Once | INTRAVENOUS | Status: AC | PRN
  Administered 2022-12-02: 5 mL via INTRAVENOUS

## 2022-12-02 MED ORDER — POTASSIUM CHLORIDE IN NACL 20-0.9 MEQ/L-% IV SOLN: INTRAVENOUS | Status: DC

## 2022-12-02 MED ORDER — MIRTAZAPINE 15 MG PO TABS
7.5000 mg | ORAL_TABLET | Freq: Every day | ORAL | Status: DC
  Administered 2022-12-02 – 2022-12-04 (×3): 7.5 mg via ORAL
  Filled 2022-12-02 (×3): qty 1

## 2022-12-02 MED ORDER — METHYLPREDNISOLONE SODIUM SUCC 125 MG IJ SOLR
125.0000 mg | Freq: Once | INTRAMUSCULAR | Status: AC
  Administered 2022-12-02: 125 mg via INTRAVENOUS
  Filled 2022-12-02: qty 2

## 2022-12-02 NOTE — Consult Note (Signed)
Gastroenterology Consult   Referring Provider: Dr. Arbutus Leas Primary Care Physician:  Frederich Chick., MD Primary Gastroenterologist:  [facility]  Patient ID: [name]; [id number]; [birth year]   Admit date: 12/01/2022  LOS: 0 days   Date of Consultation: 12/02/2022  Reason for Consultation:  Elevated LFTs, concern for acute on chronic pancreatitis  History of Present Illness   [name] is a 72 y.o. year old female with multiple medical issues including COPD, sacral osteomyelitis, GERD, Barrett's in 2019, HTN, prior stroke, pneumonia, meningitis, anxiety, history of intermittent elevated LFTs, adenomas and overdue for surveillance, residing at an assisted living facility, with GI consulted due to elevated LFTs and concern for acute on chronic pancreatitis. She is a difficult historian.  CT abd/pelvis without acute findings, prior cholecystectomy, stable chronic diffuse biliary dialtation, chronic pancreatitis. ALk Phos 376, ALT 611, AST 215, elevated from baseline. Lipase non-specific at 60. Acute hepatitis panel non-reactive.    Elevated LFTs intermittently throughout the past after review of Epic, normalizing at times.  History of pancreatic cysts on prior imaging.   Pain X 2 weeks. Worsened after eating. Hurting currently. Notes in epigastric and RUQ.   Unknown city. Knows the president. From [facility].    No overt GI bleeding. No diarrhea today. Reported about 3 loose stools yesterday. Usually just once every 3 days.     Feb 2022 EGD: Moderate gastritis s/p biopsy, GE junction normal. No biopsies of GE junction. Negative H.pylori.   Jan 2019 screening colonoscopy Sessile serrated polyps and tubular adenomas. 3 year surveillance recommended.    January 2019 EGD for abdominal pain, nausea and wall thickening of stomach on CT scan -Monilial esophagitis. - Z-line irregular, 35 cm from the incisors. Biopsied. - Gastritis. Biopsied. - Normal examined  duodenum. Path with +Barrett's    Past Medical History:  Diagnosis Date   Acute blood loss anemia    Arrhythmia    Arthritis    C. difficile colitis    Cancer (HCC)    COPD (chronic obstructive pulmonary disease) (HCC)    Diverticulosis    Fibromyalgia    GERD (gastroesophageal reflux disease)    GI hemorrhage    H/O: substance abuse (HCC) 2005   narcotic usage due to chronic back pain   Heart murmur    Hypertension    Pancreatic cyst    Pneumococcal meningitis    PONV (postoperative nausea and vomiting)    Renal cyst    Stroke (cerebrum) [facility])     Past Surgical History:  Procedure Laterality Date   ABDOMINAL HYSTERECTOMY     ABDOMINAL SURGERY     APPENDECTOMY     BIOPSY  04/22/2020   Procedure: BIOPSY;  Surgeon: Rachael Fee, MD;  Location: [facility];  Service: Gastroenterology;;   BREAST SURGERY     CHOLECYSTECTOMY N/A 09/30/2012   Procedure: LAPAROSCOPIC CHOLECYSTECTOMY WITH INTRAOPERATIVE CHOLANGIOGRAM;  Surgeon: Wilmon Arms. Corliss Skains, MD;  Location: [facility];  Service: General;  Laterality: N/A;   ESOPHAGOGASTRODUODENOSCOPY (EGD) WITH PROPOFOL N/A 04/22/2020   Procedure: ESOPHAGOGASTRODUODENOSCOPY (EGD) WITH PROPOFOL;  Surgeon: Rachael Fee, MD;  Location: [facility];  Service: Gastroenterology;  Laterality: N/A;   FRACTURE SURGERY     right upper arm   OVARIAN CYST SURGERY      Prior to Admission medications   Medication Sig Start Date End Date Taking? Authorizing Provider  nystatin (MYCOSTATIN) 100000 UNIT/ML suspension Take 5 mLs (500,000 Units total) by mouth 4 (four) times daily for 14 days.  Swish and spit 12/01/22 12/15/22 Yes Beatty, Celeste A, PA-C  acetaminophen (TYLENOL) 500 MG tablet Take 2 tablets (1,000 mg total) by mouth every 6 (six) hours as needed for mild pain. 11/15/20   Mancel Bale, MD  albuterol (VENTOLIN HFA) 108 (90 Base) MCG/ACT inhaler Inhale 1-2 puffs into the lungs every 6 (six) hours as needed for wheezing or shortness of breath.  02/28/22   [provider]  ARIPiprazole (ABILIFY) 5 MG tablet Take 5 mg by mouth daily. 02/14/22   [provider]  buprenorphine (SUBUTEX) 2 MG SUBL SL tablet Place 2 tablets (4 mg total) under the tongue every 6 (six) hours as needed (pain). 03/08/22   Sherryll Burger, Pratik D, DO  carvedilol (COREG) 6.25 MG tablet Take 1 tablet (6.25 mg total) by mouth 2 (two) times daily with a meal. 11/15/20   Mancel Bale, MD  DHEA 10 MG CAPS Take 1 capsule by mouth daily.    [provider]  diclofenac Sodium (VOLTAREN) 1 % GEL Apply 2 g topically 4 (four) times daily. 09/18/22   Carmel Sacramento A, PA-C  donepezil (ARICEPT) 10 MG tablet Take 10 mg by mouth daily. 02/15/22   [provider]  doxepin (SINEQUAN) 50 MG capsule Take 1 capsule (50 mg total) by mouth daily. 03/08/22   Sherryll Burger, Pratik D, DO  GOODSENSE IBUPROFEN 200 MG tablet Take 200 mg by mouth daily. 02/14/22   [provider]  Iodine Strong, Lugols, (STRONG IODINE) 5 % solution Take 1 mL by mouth 3 (three) times a week. 12/10/21   [provider]  LORazepam (ATIVAN) 0.5 MG tablet Take 1 tablet (0.5 mg total) by mouth 2 (two) times daily as needed for anxiety. 03/08/22   Sherryll Burger, Pratik D, DO  losartan (COZAAR) 100 MG tablet Take 100 mg by mouth daily. 02/15/22   [provider]  mirtazapine (REMERON) 7.5 MG tablet Take 7.5 mg by mouth at bedtime. 02/20/22   [provider]  naloxone [facility]) nasal spray 4 mg/0.1 mL Place 1 spray into the nose once. 12/04/21   [provider]  Nutritional Supplements (ENSURE ORIGINAL) LIQD Take 1 Can by mouth in the morning, at noon, in the evening, and at bedtime. 02/01/22   [provider]  OLANZapine (ZYPREXA) 10 MG tablet Take 1 tablet (10 mg total) by mouth at bedtime. 03/08/22   Sherryll Burger, Pratik D, DO  temazepam (RESTORIL) 22.5 MG capsule Take 1 capsule (22.5 mg total) by mouth at bedtime. 03/08/22   Sherryll Burger, Pratik D, DO  traMADol (ULTRAM) 50 MG  tablet Take 1 tablet (50 mg total) by mouth every 4 (four) hours as needed for moderate pain. 03/08/22   Sherryll Burger, Pratik D, DO  TRELEGY ELLIPTA 100-62.5-25 MCG/ACT AEPB Inhale 1 puff into the lungs daily. 02/28/22   [provider]  VITAMIN A PO Take 1 capsule by mouth 3 (three) times a week.    [provider]  Zinc Sulfate 220 (50 Zn) MG TABS Take 0.5 tablets by mouth 3 (three) times a week. 02/14/22   [provider]    Current Facility-Administered Medications  Medication Dose Route Frequency Provider Last Rate Last Admin   0.9 % NaCl with KCl 20 mEq/ L  infusion   Intravenous Continuous Tat, David, MD 100 mL/hr at 12/02/22 1453 New Bag at 12/02/22 1453   arformoterol (BROVANA) nebulizer solution 15 mcg  15 mcg Nebulization BID Tat, David, MD       ARIPiprazole (ABILIFY) tablet 5 mg  5  mg Oral Daily Tat, David, MD   5 mg at 12/02/22 1447   budesonide (PULMICORT) nebulizer solution 0.5 mg  0.5 mg Nebulization BID Tat, Onalee Hua, MD       carvedilol (COREG) tablet 6.25 mg  6.25 mg Oral BID WC Tat, David, MD       donepezil (ARICEPT) tablet 10 mg  10 mg Oral QHS Tat, Onalee Hua, MD       enoxaparin (LOVENOX) injection 40 mg  40 mg Subcutaneous Q24H Tat, David, MD       feeding supplement (ENSURE ENLIVE / ENSURE PLUS) liquid 237 mL  237 mL Oral BID BM Tat, David, MD   237 mL at 12/02/22 1649   ipratropium-albuterol (DUONEB) 0.5-2.5 (3) MG/3ML nebulizer solution 3 mL  3 mL Nebulization Q6H Tat, David, MD       mirtazapine (REMERON) tablet 7.5 mg  7.5 mg Oral QHS Tat, Onalee Hua, MD       oxyCODONE (Oxy IR/ROXICODONE) immediate release tablet 2.5 mg  2.5 mg Oral Q6H PRN Tat, David, MD   2.5 mg at 12/02/22 1458   potassium chloride SA (KLOR-CON M) CR tablet 20 mEq  20 mEq Oral Once Tat, David, MD       prochlorperazine (COMPAZINE) injection 10 mg  10 mg Intravenous Q6H PRN Catarina Hartshorn, MD        Allergies as of 12/01/2022 - Review Complete 12/01/2022  Allergen Reaction Noted   Tylenol  [acetaminophen] Other (See Comments) 11/13/2016   Codeine Nausea And Vomiting 08/19/2012   Nsaids Other (See Comments) 08/19/2012   Tolmetin Other (See Comments) 08/19/2012   Tramadol Other (See Comments) 06/06/2017    Family History  Problem Relation Age of Onset   Alzheimer's disease Mother    Heart disease Father    Prostate cancer Father     Social History   Socioeconomic History   Marital status: Widowed    Spouse name: Not on file   Number of children: Not on file   Years of education: Not on file   Highest education level: Not on file  Occupational History   Not on file  Tobacco Use   Smoking status: Every Day    Current packs/day: 0.40    Average packs/day: 0.4 packs/day for 49.0 years (19.6 ttl pk-yrs)    Types: Cigarettes   Smokeless tobacco: Never  Vaping Use   Vaping status: Never Used  Substance and Sexual Activity   Alcohol use: No   Drug use: No    Comment: Methadone for narcotic use   Sexual activity: Not Currently  Other Topics Concern   Not on file  Social History Narrative   Not on file   Social Determinants of Health   Financial Resource Strain: Not on file  Food Insecurity: No Food Insecurity (12/02/2022)   Hunger Vital Sign    Worried About Running Out of Food in the Last Year: Never true    Ran Out of Food in the Last Year: Never true  Transportation Needs: No Transportation Needs (12/02/2022)   PRAPARE - Administrator, Civil Service (Medical): No    Lack of Transportation (Non-Medical): No  Physical Activity: Not on file  Stress: Stress Concern Present (05/10/2020)   Received from [facility], [facility] - Occupational Stress Questionnaire    Feeling of Stress : Very much  Social Connections: Unknown (07/21/2021)   [facility]   Social Network  Social Network: Not on file  Intimate Partner Violence: Not At Risk (12/02/2022)   Humiliation,  Afraid, Rape, and Kick questionnaire    Fear of Current or Ex-Partner: No    Emotionally Abused: No    Physically Abused: No    Sexually Abused: No     Review of Systems   Limited due to cognitive status  Physical Exam   Vital Signs in last 24 hours: Temp:  [97.8 F (36.6 C)-99.6 F (37.6 C)] 97.8 F (36.6 C) (09/23 1326) Pulse Rate:  [82-115] 92 (09/23 1326) Resp:  [10-24] 18 (09/23 1326) BP: (112-186)/(54-92) 126/64 (09/23 1326) SpO2:  [86 %-100 %] 97 % (09/23 1326) Weight:  [46.1 kg] 46.1 kg (09/23 1326)    General:   Alert,  chronically ill-appearing, frail Head:  Normocephalic and atraumatic. Eyes:  Sclera clear, no icterus.    Lungs:  Clear throughout to auscultation.   No wheezes, crackles, or rhonchi. No acute distress. Heart:  S1` S2 present without murmurs. Abdomen:  Soft, nontender and nondistended. No masses, hepatosplenomegaly or hernias noted. Normal bowel sounds, without guarding, and without rebound.   Rectal: deferred   Msk:  Symmetrical without gross deformities. Normal posture. Extremities:  Without edema. Neurologic:  Alert and  oriented to person. Skin:  Intact without significant lesions or rashes. Psych:  Alert and cooperative. Normal mood and affect.  Intake/Output from previous day: 09/22 0701 - 09/23 0700 In: 700 [IV Piggyback:700] Out: -  Intake/Output this shift: Total I/O In: 106.8 [I.V.:106.8] Out: -    Labs/Studies   Recent Labs Recent Labs    12/01/22 1353 12/02/22 1249  WBC 13.1* 9.2  HGB 14.2 12.7  HCT 45.4 39.8  PLT 308 298   BMET Recent Labs    12/01/22 1353 12/02/22 1249  NA 140 143  K 2.9* 3.4*  CL 98 102  CO2 30 29  GLUCOSE 69* 70  BUN 29* 20  CREATININE 0.73 0.67  CALCIUM 9.4 9.1   LFT Recent Labs    12/01/22 1353 12/02/22 1249  PROT 6.8 6.0*  ALBUMIN 3.5 3.1*  AST 215* 77*  ALT 611* 347*  ALKPHOS 376* 283*  BILITOT 1.2 1.0   PT/INR Recent Labs    12/02/22 1712  LABPROT 14.5  INR 1.1    Hepatitis Panel Recent Labs    12/01/22 1901  HEPBSAG NON REACTIVE  HCVAB NON REACTIVE  HEPAIGM NON REACTIVE  HEPBIGM NON REACTIVE     Radiology/Studies CT ABDOMEN PELVIS W CONTRAST  Result Date: 12/01/2022 CLINICAL DATA:  Diffuse abdominal pain and anorexia for 3 days. Elevated liver function tests. EXAM: CT ABDOMEN AND PELVIS WITH CONTRAST TECHNIQUE: Multidetector CT imaging of the abdomen and pelvis was performed using the standard protocol following bolus administration of intravenous contrast. RADIATION DOSE REDUCTION: This exam was performed according to the departmental dose-optimization program which includes automated exposure control, adjustment of the mA and/or kV according to patient size and/or use of iterative reconstruction technique. CONTRAST:  75 mL OMNIPAQUE IOHEXOL 350 MG/ML SOLN COMPARISON:  12/17/2020 FINDINGS: Lower Chest: No acute findings. Hepatobiliary: No suspicious hepatic masses identified. Prior cholecystectomy again noted. Diffuse biliary ductal dilatation remains unchanged. Pancreas: No evidence of pancreatic mass or acute pancreatitis. Scattered tiny pancreatic calcifications are noted, consistent with chronic pancreatitis. No evidence of peripancreatic fluid collections. Spleen: Within normal limits in size and appearance. Adrenals/Urinary Tract: Multiple small benign-appearing renal cysts are again seen bilaterally (No followup imaging is recommended). No suspicious masses identified. No evidence of  ureteral calculi or hydronephrosis. Stomach/Bowel: No evidence of obstruction, inflammatory process or abnormal fluid collections. Diverticulosis is seen mainly involving the sigmoid colon, however there is no evidence of diverticulitis. Vascular/Lymphatic: No pathologically enlarged lymph nodes. No acute vascular findings. Reproductive: Prior hysterectomy noted. Adnexal regions are unremarkable in appearance. Other:  None. Musculoskeletal: No suspicious bone lesions  identified. Multiple lower thoracic and lumbar vertebral body compression fractures are seen, which are chronic in appearance IMPRESSION: No acute findings within the abdomen or pelvis. Prior cholecystectomy. Stable chronic diffuse biliary ductal dilatation. Chronic pancreatitis. Colonic diverticulosis, without radiographic evidence of diverticulitis. Electronically Signed   By: Danae Orleans M.D.   On: 12/01/2022 16:51   CT Angio Chest PE W and/or Wo Contrast  Result Date: 12/01/2022 CLINICAL DATA:  Chest pain and weakness shortness of breath. Probability for pulmonary embolism. EXAM: CT ANGIOGRAPHY CHEST WITH CONTRAST TECHNIQUE: Multidetector CT imaging of the chest was performed using the standard protocol during bolus administration of intravenous contrast. Multiplanar CT image reconstructions and MIPs were obtained to evaluate the vascular anatomy. RADIATION DOSE REDUCTION: This exam was performed according to the departmental dose-optimization program which includes automated exposure control, adjustment of the mA and/or kV according to patient size and/or use of iterative reconstruction technique. CONTRAST:  75 mL OMNIPAQUE IOHEXOL 350 MG/ML SOLN COMPARISON:  None Available. FINDINGS: Cardiovascular: Satisfactory opacification of pulmonary arteries noted, and no pulmonary emboli identified. No evidence of thoracic aortic dissection or aneurysm. Mediastinum/Nodes: No masses or pathologically enlarged lymph nodes identified. Lungs/Pleura: No pulmonary mass, infiltrate, or effusion. Upper abdomen: No acute findings. Musculoskeletal: No suspicious bone lesions identified. Review of the MIP images confirms the above findings. IMPRESSION: Negative. No evidence of pulmonary embolism or other acute findings. Electronically Signed   By: Danae Orleans M.D.   On: 12/01/2022 16:43     Assessment   MANDOLIN SCARBERRY is a 72 y.o. year old with multiple medical issues as noted above, admitted with COPD exacerbation  and found to have elevated LFTs and concern for acute on chronic pancreatitis. She is a difficult historian and resides in an assisted living facility.   Notably, she has fluctuating elevated transaminases and alk phos over the years with normalization in between. Chronic diffuse biliary dilatation noted on CT and question of chronic pancreatitis. Lipase non-specific at 60 and not likely dealing with pancreatitis. Known pancreatic cysts in the past.   LFTs have improved today from admission drastically. Will check further serologies to be thorough and pursue MRCP tomorrow.    Overdue for surveillance colonoscopy: recommend this as outpatient with [facility], who is primary.   History of Barrett's: noted in 2019. EGD last in 2022 but GE junction normal and no biopsies at that time.    Plan / Recommendations    Additional liver serologies MRI/MRCP in am Stool studies ordered in case of further diarrhea Consider adding pancreatic enzymes      12/02/2022, 5:41 PM  Gelene Mink, PhD, ANP-BC [facility]

## 2022-12-02 NOTE — ED Notes (Signed)
[phone] Tai (supervisor) Call for updates and discharge or admission

## 2022-12-02 NOTE — ED Provider Notes (Signed)
Patient was seen yesterday for general weakness.  She does endorse some general abdominal pain and some shortness of breath.  She thinks she might of had a fever.  She does not normally require oxygen.  She also does endorse some back pain which she said has been worse over the last week or so.  She had a fairly extensive workup with elevated white count newly elevated LFTs, COVID and flu negative.  She had a CT angio chest that was negative and a CT abdomen and pelvis that did not show any acute findings.  7:30 AM.  I was asked by patient's nurse to reevaluate her.  She is waiting for transport back to her facility.  Patient was unable to get up to stand at the side of the bed, can barely sit at the bedside.  Also when taken off oxygen her sats dropped into the mid 80s.  Does not routinely require oxygen.  Placed back on nasal cannula.  Have ordered some more lab work. Physical Exam  BP (!) 144/72   Pulse 95   Temp 99.1 F (37.3 C) (Oral)   Resp (!) 24   Wt 40.8 kg   SpO2 95%   BMI 15.94 kg/m   Physical Exam  Procedures  Procedures  ED Course / MDM   Clinical Course as of 12/02/22 0725  Sun Dec 01, 2022  1822 From skilled nursing recovering from surgery for a sacral decubitus ulcer presents for decreased p.o. intake and generalized weakness, noted to have LFT elevation, also mild hypoxia likely due to her COPD, out of abundance of precaution got CTA chest and CT abdomen pelvis, no acute findings, she has surgically absent gallbladder and has some bile duct dilatation likely postsurgical, bilirubin is not elevated.  She is not on a statin, not taking Tylenol.  She has very mild right upper quadrant pain but her exam is overall reassuring.  No open wounds on exam.  She does have thrush on her tongue.  Will treat with nystatin.  Sugar slightly low, potassium also low likely due to decreased p.o. intake.  Will recheck after she eats and drinks.  She is getting potassium replacement.  I spatially  discharged home after she gets remainder of potassium and sugar is improved.  She will need close GI follow-up and PCP follow-up [CB]    Clinical Course User Index [CB] Ma Rings, PA-C   Medical Decision Making Amount and/or Complexity of Data Reviewed Labs: ordered. Radiology: ordered.  Risk Prescription drug management.   40 a.m.  Discussed with Dr. Arbutus Leas [facility] hospitalist who will evaluate patient for admission.       Terrilee Files, MD 12/02/22 1140

## 2022-12-02 NOTE — ED Notes (Signed)
ED TO INPATIENT HANDOFF REPORT  ED Nurse Name and Phone #: [phone]  S Name/Age/Gender [name] 72 y.o. female Room/Bed: APA06/APA06  Code Status   Code Status: Prior  Home/SNF/Other Boarding Home Patient oriented to: self, place, time, and situation Is this baseline? Yes   Triage Complete: Triage complete  Chief Complaint Acute respiratory failure with hypoxia (HCC) [J96.01]  Triage Note BIB [facility] from SNF[facility], here for general weakness and decreased PO intake over last 3 days. Denies CP, sob, NVD, fever, dizziness. Mentions some low back pain. Recent week long admission at [facility] with ID of sacral wound. A&Ox4. VSS. ABT completed 1-2 weeks ago. CBG 79. Lethargic, able to hold conversation. Lips chapped.     Allergies Allergies  Allergen Reactions   Tylenol [Acetaminophen] Other (See Comments)    Inflammed Liver   Codeine Nausea And Vomiting   Nsaids Other (See Comments)    Stomach ulcers   Tolmetin Other (See Comments)    Stomach ulcers   Tramadol Other (See Comments)    GI UPSET    Level of Care/Admitting Diagnosis ED Disposition     ED Disposition  Admit   Condition  --   Comment  Hospital Area: [facility] [[id number]]  Level of Care: Med-Surg [16]  Covid Evaluation: Confirmed COVID Negative  Diagnosis: Acute respiratory failure with hypoxia [facility]) [[id number]]  Admitting Physician: TAT, DAVID [email or web address]-ming.Orion  Attending Physician: TAT, DAVID Shoi-ming.Orion  Certification:: I certify this patient will need inpatient services for at least 2 midnights  Expected Medical Readiness: 12/04/2022          B Medical/Surgery History Past Medical History:  Diagnosis Date   Acute blood loss anemia    Arrhythmia    Arthritis    C. difficile colitis    Cancer (HCC)    COPD (chronic obstructive pulmonary disease) (HCC)    Diverticulosis    Fibromyalgia    GERD (gastroesophageal reflux disease)    GI hemorrhage    H/O: substance abuse (HCC)  2005   narcotic usage due to chronic back pain   Heart murmur    Hypertension    Pancreatic cyst    Pneumococcal meningitis    PONV (postoperative nausea and vomiting)    Renal cyst    Stroke (cerebrum) [facility])    Past Surgical History:  Procedure Laterality Date   ABDOMINAL HYSTERECTOMY     ABDOMINAL SURGERY     APPENDECTOMY     BIOPSY  04/22/2020   Procedure: BIOPSY;  Surgeon: Rachael Fee, MD;  Location: [facility];  Service: Gastroenterology;;   BREAST SURGERY     CHOLECYSTECTOMY N/A 09/30/2012   Procedure: LAPAROSCOPIC CHOLECYSTECTOMY WITH INTRAOPERATIVE CHOLANGIOGRAM;  Surgeon: Wilmon Arms. Corliss Skains, MD;  Location: [facility];  Service: General;  Laterality: N/A;   ESOPHAGOGASTRODUODENOSCOPY (EGD) WITH PROPOFOL N/A 04/22/2020   Procedure: ESOPHAGOGASTRODUODENOSCOPY (EGD) WITH PROPOFOL;  Surgeon: Rachael Fee, MD;  Location: [facility];  Service: Gastroenterology;  Laterality: N/A;   FRACTURE SURGERY     right upper arm   OVARIAN CYST SURGERY       A IV Location/Drains/Wounds Patient Lines/Drains/Airways Status     Active Line/Drains/Airways     Name Placement date Placement time Site Days   Peripheral IV 12/01/22 20 G Right Antecubital 12/01/22  1555  Antecubital  1   Wound / Incision (Open or Dehisced) 04/22/20 Other (Comment) Knee Anterior;Right;Left 04/22/20  0846  Knee  954  Intake/Output Last 24 hours  Intake/Output Summary (Last 24 hours) at 12/02/2022 1229 Last data filed at 12/01/2022 1904 Gross per 24 hour  Intake 700 ml  Output --  Net 700 ml    Labs/Imaging Results for orders placed or performed during the hospital encounter of 12/01/22 (from the past 48 hour(s))  Resp panel by RT-PCR (RSV, Flu A&B, Covid) Anterior Nasal Swab     Status: None   Collection Time: 12/01/22  1:27 PM   Specimen: Anterior Nasal Swab  Result Value Ref Range   SARS Coronavirus 2 by RT PCR NEGATIVE NEGATIVE    Comment: (NOTE) SARS-CoV-2 target nucleic acids  are NOT DETECTED.  The SARS-CoV-2 RNA is generally detectable in upper respiratory specimens during the acute phase of infection. The lowest concentration of SARS-CoV-2 viral copies this assay can detect is 138 copies/mL. A negative result does not preclude SARS-Cov-2 infection and should not be used as the sole basis for treatment or other patient management decisions. A negative result may occur with  improper specimen collection/handling, submission of specimen other than nasopharyngeal swab, presence of viral mutation(s) within the areas targeted by this assay, and inadequate number of viral copies(<138 copies/mL). A negative result must be combined with clinical observations, patient history, and epidemiological information. The expected result is Negative.  Fact Sheet for Patients:  BloggerCourse.com  Fact Sheet for Healthcare Providers:  SeriousBroker.it  This test is no t yet approved or cleared by the [place] FDA and  has been authorized for detection and/or diagnosis of SARS-CoV-2 by FDA under an Emergency Use Authorization (EUA). This EUA will remain  in effect (meaning this test can be used) for the duration of the COVID-19 declaration under Section 564(b)(1) of the Act, 21 U.S.C.section 360bbb-3(b)(1), unless the authorization is terminated  or revoked sooner.       Influenza A by PCR NEGATIVE NEGATIVE   Influenza B by PCR NEGATIVE NEGATIVE    Comment: (NOTE) The Xpert Xpress SARS-CoV-2/FLU/RSV plus assay is intended as an aid in the diagnosis of influenza from Nasopharyngeal swab specimens and should not be used as a sole basis for treatment. Nasal washings and aspirates are unacceptable for Xpert Xpress SARS-CoV-2/FLU/RSV testing.  Fact Sheet for Patients: BloggerCourse.com  Fact Sheet for Healthcare Providers: SeriousBroker.it  This test is not yet  approved or cleared by the [place] FDA and has been authorized for detection and/or diagnosis of SARS-CoV-2 by FDA under an Emergency Use Authorization (EUA). This EUA will remain in effect (meaning this test can be used) for the duration of the COVID-19 declaration under Section 564(b)(1) of the Act, 21 U.S.C. section 360bbb-3(b)(1), unless the authorization is terminated or revoked.     Resp Syncytial Virus by PCR NEGATIVE NEGATIVE    Comment: (NOTE) Fact Sheet for Patients: BloggerCourse.com  Fact Sheet for Healthcare Providers: SeriousBroker.it  This test is not yet approved or cleared by the [place] FDA and has been authorized for detection and/or diagnosis of SARS-CoV-2 by FDA under an Emergency Use Authorization (EUA). This EUA will remain in effect (meaning this test can be used) for the duration of the COVID-19 declaration under Section 564(b)(1) of the Act, 21 U.S.C. section 360bbb-3(b)(1), unless the authorization is terminated or revoked.  Performed at [facility], [address], [place], [place] [address]   Urinalysis, Routine w reflex microscopic -Urine, Clean Catch     Status: Abnormal   Collection Time: 12/01/22  1:28 PM  Result Value Ref Range   Color,  Urine YELLOW YELLOW   APPearance CLOUDY (A) CLEAR   Specific Gravity, Urine 1.016 1.005 - 1.030   pH 5.0 5.0 - 8.0   Glucose, UA NEGATIVE NEGATIVE mg/dL   Hgb urine dipstick NEGATIVE NEGATIVE   Bilirubin Urine NEGATIVE NEGATIVE   Ketones, ur 20 (A) NEGATIVE mg/dL   Protein, ur 30 (A) NEGATIVE mg/dL   Nitrite NEGATIVE NEGATIVE   Leukocytes,Ua NEGATIVE NEGATIVE   RBC / HPF 0-5 0 - 5 RBC/hpf   WBC, UA 0-5 0 - 5 WBC/hpf   Bacteria, UA RARE (A) NONE SEEN   Squamous Epithelial / HPF 11-20 0 - 5 /HPF    Comment: Performed at [facility], [address], [place], [place] [address]  CBC with Differential     Status: Abnormal   Collection Time: 12/01/22   1:53 PM  Result Value Ref Range   WBC 13.1 (H) 4.0 - 10.5 K/uL   RBC 4.84 3.87 - 5.11 MIL/uL   Hemoglobin 14.2 12.0 - 15.0 g/dL   HCT 63.0 16.0 - 10.9 %   MCV 93.8 80.0 - 100.0 fL   MCH 29.3 26.0 - 34.0 pg   MCHC 31.3 30.0 - 36.0 g/dL   RDW 32.3 55.7 - 32.2 %   Platelets 308 150 - 400 K/uL   nRBC 0.0 0.0 - 0.2 %   Neutrophils Relative % 78 %   Neutro Abs 10.4 (H) 1.7 - 7.7 K/uL   Lymphocytes Relative 11 %   Lymphs Abs 1.4 0.7 - 4.0 K/uL   Monocytes Relative 9 %   Monocytes Absolute 1.1 (H) 0.1 - 1.0 K/uL   Eosinophils Relative 1 %   Eosinophils Absolute 0.1 0.0 - 0.5 K/uL   Basophils Relative 1 %   Basophils Absolute 0.1 0.0 - 0.1 K/uL   Immature Granulocytes 0 %   Abs Immature Granulocytes 0.05 0.00 - 0.07 K/uL    Comment: Performed at [facility], [address] [place], [place] [address]  Comprehensive metabolic panel     Status: Abnormal   Collection Time: 12/01/22  1:53 PM  Result Value Ref Range   Sodium 140 135 - 145 mmol/L   Potassium 2.9 (L) 3.5 - 5.1 mmol/L   Chloride 98 98 - 111 mmol/L   CO2 30 22 - 32 mmol/L   Glucose, Bld 69 (L) 70 - 99 mg/dL    Comment: Glucose reference range applies only to samples taken after fasting for at least 8 hours.   BUN 29 (H) 8 - 23 mg/dL   Creatinine, Ser 7.06 0.44 - 1.00 mg/dL   Calcium 9.4 8.9 - 23.7 mg/dL   Total Protein 6.8 6.5 - 8.1 g/dL   Albumin 3.5 3.5 - 5.0 g/dL   AST 628 (H) 15 - 41 U/L   ALT 611 (H) 0 - 44 U/L   Alkaline Phosphatase 376 (H) 38 - 126 U/L   Total Bilirubin 1.2 0.3 - 1.2 mg/dL   GFR, Estimated >31 >51 mL/min    Comment: (NOTE) Calculated using the CKD-EPI Creatinine Equation (2021)    Anion gap 12 5 - 15    Comment: Performed at [facility], [address] [place], [place] [address]  Lipase, blood     Status: Abnormal   Collection Time: 12/01/22  2:50 PM  Result Value Ref Range   Lipase 60 (H) 11 - 51 U/L    Comment: Performed at [facility], [address] [place], [place] [address]  CBG  monitoring, ED  Status: Abnormal   Collection Time: 12/01/22  5:54 PM  Result Value Ref Range   Glucose-Capillary 58 (L) 70 - 99 mg/dL    Comment: Glucose reference range applies only to samples taken after fasting for at least 8 hours.  Hepatitis panel, acute     Status: None   Collection Time: 12/01/22  7:01 PM  Result Value Ref Range   Hepatitis B Surface Ag NON REACTIVE NON REACTIVE   HCV Ab NON REACTIVE NON REACTIVE    Comment: (NOTE) Nonreactive HCV antibody screen is consistent with no HCV infections,  unless recent infection is suspected or other evidence exists to indicate HCV infection.     Hep A IgM NON REACTIVE NON REACTIVE   Hep B C IgM NON REACTIVE NON REACTIVE    Comment: Performed at [facility], [address] [place], [place] [address]  POC CBG, ED     Status: None   Collection Time: 12/01/22  7:20 PM  Result Value Ref Range   Glucose-Capillary 77 70 - 99 mg/dL    Comment: Glucose reference range applies only to samples taken after fasting for at least 8 hours.  Lactic acid, plasma     Status: None   Collection Time: 12/02/22 10:26 AM  Result Value Ref Range   Lactic Acid, Venous 0.6 0.5 - 1.9 mmol/L    Comment: Performed at [facility], [address], [place], [place] [address]  Troponin I (High Sensitivity)     Status: None   Collection Time: 12/02/22 10:26 AM  Result Value Ref Range   Troponin I (High Sensitivity) 8 <18 ng/L    Comment: (NOTE) Elevated high sensitivity troponin I (hsTnI) values and significant  changes across serial measurements may suggest ACS but many other  chronic and acute conditions are known to elevate hsTnI results.  Refer to the "Links" section for chest pain algorithms and additional  guidance. Performed at [facility], [address], [place], [place] [address]   Blood gas, venous     Status: Abnormal   Collection Time: 12/02/22 10:34 AM  Result Value Ref Range   pH, Ven 7.37 7.25 - 7.43   pCO2, Ven 59 44 - 60  mmHg   pO2, Ven 80 (H) 32 - 45 mmHg   Bicarbonate 33.5 (H) 20.0 - 28.0 mmol/L   Acid-Base Excess 6.4 (H) 0.0 - 2.0 mmol/L   O2 Saturation 98.4 %   Patient temperature 37.3    Collection site BLOOD RIGHT HAND    Drawn by [phone]     Comment: Performed at [facility], [address] [place], [place] [address]   CT ABDOMEN PELVIS W CONTRAST  Result Date: 12/01/2022 CLINICAL DATA:  Diffuse abdominal pain and anorexia for 3 days. Elevated liver function tests. EXAM: CT ABDOMEN AND PELVIS WITH CONTRAST TECHNIQUE: Multidetector CT imaging of the abdomen and pelvis was performed using the standard protocol following bolus administration of intravenous contrast. RADIATION DOSE REDUCTION: This exam was performed according to the departmental dose-optimization program which includes automated exposure control, adjustment of the mA and/or kV according to patient size and/or use of iterative reconstruction technique. CONTRAST:  75 mL OMNIPAQUE IOHEXOL 350 MG/ML SOLN COMPARISON:  12/17/2020 FINDINGS: Lower Chest: No acute findings. Hepatobiliary: No suspicious hepatic masses identified. Prior cholecystectomy again noted. Diffuse biliary ductal dilatation remains unchanged. Pancreas: No evidence of pancreatic mass or acute pancreatitis. Scattered tiny pancreatic calcifications are noted, consistent with chronic pancreatitis. No evidence of peripancreatic fluid collections. Spleen:  Within normal limits in size and appearance. Adrenals/Urinary Tract: Multiple small benign-appearing renal cysts are again seen bilaterally (No followup imaging is recommended). No suspicious masses identified. No evidence of ureteral calculi or hydronephrosis. Stomach/Bowel: No evidence of obstruction, inflammatory process or abnormal fluid collections. Diverticulosis is seen mainly involving the sigmoid colon, however there is no evidence of diverticulitis. Vascular/Lymphatic: No pathologically enlarged lymph nodes. No acute vascular  findings. Reproductive: Prior hysterectomy noted. Adnexal regions are unremarkable in appearance. Other:  None. Musculoskeletal: No suspicious bone lesions identified. Multiple lower thoracic and lumbar vertebral body compression fractures are seen, which are chronic in appearance IMPRESSION: No acute findings within the abdomen or pelvis. Prior cholecystectomy. Stable chronic diffuse biliary ductal dilatation. Chronic pancreatitis. Colonic diverticulosis, without radiographic evidence of diverticulitis. Electronically Signed   By: Danae Orleans M.D.   On: 12/01/2022 16:51   CT Angio Chest PE W and/or Wo Contrast  Result Date: 12/01/2022 CLINICAL DATA:  Chest pain and weakness shortness of breath. Probability for pulmonary embolism. EXAM: CT ANGIOGRAPHY CHEST WITH CONTRAST TECHNIQUE: Multidetector CT imaging of the chest was performed using the standard protocol during bolus administration of intravenous contrast. Multiplanar CT image reconstructions and MIPs were obtained to evaluate the vascular anatomy. RADIATION DOSE REDUCTION: This exam was performed according to the departmental dose-optimization program which includes automated exposure control, adjustment of the mA and/or kV according to patient size and/or use of iterative reconstruction technique. CONTRAST:  75 mL OMNIPAQUE IOHEXOL 350 MG/ML SOLN COMPARISON:  None Available. FINDINGS: Cardiovascular: Satisfactory opacification of pulmonary arteries noted, and no pulmonary emboli identified. No evidence of thoracic aortic dissection or aneurysm. Mediastinum/Nodes: No masses or pathologically enlarged lymph nodes identified. Lungs/Pleura: No pulmonary mass, infiltrate, or effusion. Upper abdomen: No acute findings. Musculoskeletal: No suspicious bone lesions identified. Review of the MIP images confirms the above findings. IMPRESSION: Negative. No evidence of pulmonary embolism or other acute findings. Electronically Signed   By: Danae Orleans M.D.   On:  12/01/2022 16:43    Pending Labs Unresulted Labs (From admission, onward)     Start     Ordered   12/02/22 1225  Vitamin B12  Once,   R        12/02/22 1224   12/02/22 1225  CK  Once,   R        12/02/22 1224   12/02/22 1225  Folate  Once,   R        12/02/22 1224   12/02/22 1225  TSH  Once,   R        12/02/22 1224   12/02/22 1225  T4, free  Once,   R        12/02/22 1224   12/02/22 1224  Comprehensive metabolic panel  Once,   R        12/02/22 1224   12/02/22 1224  CBC with Differential/Platelet  Once,   R        12/02/22 1224   12/02/22 1224  Procalcitonin  Once,   R       References:    Procalcitonin Lower Respiratory Tract Infection AND Sepsis Procalcitonin Algorithm   12/02/22 1224   12/02/22 0726  Lactic acid, plasma  Now then every 2 hours,   R (with STAT occurrences)      12/02/22 0725   12/02/22 0726  Culture, blood (routine x 2)  BLOOD CULTURE X 2,   R (with STAT occurrences)      12/02/22 0725  Vitals/Pain Today's Vitals   12/02/22 0730 12/02/22 0800 12/02/22 1107 12/02/22 1133  BP: (!) 145/63 138/60 (!) 122/58   Pulse: 92 97 (!) 106   Resp:  20 16   Temp:    99.2 F (37.3 C)  TempSrc:    Oral  SpO2: 97% 97% 94%   Weight:      PainSc:        Isolation Precautions No active isolations  Medications Medications  methylPREDNISolone sodium succinate (SOLU-MEDROL) 125 mg/2 mL injection 125 mg (has no administration in time range)  ipratropium-albuterol (DUONEB) 0.5-2.5 (3) MG/3ML nebulizer solution 3 mL (has no administration in time range)  lactated ringers bolus 500 mL (0 mLs Intravenous Stopped 12/01/22 1624)  potassium chloride 10 mEq in 100 mL IVPB (0 mEq Intravenous Stopped 12/01/22 1904)  iohexol (OMNIPAQUE) 350 MG/ML injection 75 mL (75 mLs Intravenous Contrast Given 12/01/22 1600)  potassium chloride SA (KLOR-CON M) CR tablet 40 mEq (40 mEq Oral Given 12/01/22 1747)    Mobility walks with device     Focused  Assessments    R Recommendations: See Admitting Provider Note  Report given to:   Additional Notes: Pt walks with device at baseline but has ben unable to ambulate in hospital due to weakness and hypoxia.

## 2022-12-02 NOTE — ED Notes (Addendum)
Attempted to ambulate patient without oxygen per requirement for facility requirements. Patient dropped to 86 after sitting up. Pt reported she was too weak to continue

## 2022-12-02 NOTE — H&P (Signed)
History and Physical    Patient: [name] ZDG:[id number] DOB: [birth year] DOA: 12/01/2022 DOS: the patient was seen and examined on 12/02/2022 PCP: Frederich Chick., MD  Patient coming from: SNF  Chief Complaint:  Chief Complaint  Patient presents with   Weakness   HPI: [name] is a 72 year old female with a history of COPD, sacral osteomyelitis with stage IV sacral decubitus, GERD, hypertension, stroke, Streptococcus pneumoniae bacteremia and meningitis 04/2016, and anxiety/anxiety with psychotic features presenting with generalized weakness, decreased oral intake, and central abdominal pain for 2 to 3 days.  The patient has had some associated nausea and vomiting for the same amount of time.  She denies any hematemesis.  She has had some loose stools, stating she had 2 loose stools on the day of admission.  She denies hematochezia or melena.  She is unclear whether she has been started on any new medications. She also complains of some shortness of breath for the last 2 to 3 days.  She continues to smoke 4 cigarettes/day.  She has smoked from 1/2 pack to 1 pack/day x 20 years.  She has nonproductive cough.  She denies any chest pain, hemoptysis.  She has had some subjective fevers and chills.  She denies any headache or neck pain. In the ED, the patient had a low-grade temperature 99.6 F.  She was hemodynamically stable.  Oxygen saturation was 86% room air.  She was placed on 2 L with saturation up to 95%.  CTA chest was negative for PE.  There is no infiltrates or edema.  CT abdomen pelvis was negative for any acute findings.  She is status post cholecystectomy.  There is chronic biliary ductal dilatation with pancreatic calcifications.  There is no pancreatic stranding. WBC 13.1, hemoglobin 14.2, platelets 308.  Sodium 140, potassium 2.9, bicarbonate 30, serum creatinine 0.73.  AST 215, ALT 611, alk phosphatase 376, total bilirubin 1.2.  Viral hepatitis panel is  negative.    The patient was recently admitted to [facility] from 09/30/2022 to 10/07/2022 to be initiated on intravenous antibiotics secondary to a positive intraoperative culture with corynebacterium striatum.  Notably, the patient had an MRI of the pelvis on 06/24/2022 which showed sacral osteomyelitis.  She has underwent excision of the sacral pressure ulcer and ostectomy with bilateral gluteus fasciocutaneous flap closure on 7/16 with plastic surgery at [facility].  She was discharged to skilled nursing facility on 09/24/2022 with p.o. cephalexin.  Subsequently, intraoperative cultures returned positive for corynebacterium striatum.  Because of this, the patient was advised to present for admission.  The patient was started on IV vancomycin and discharged back to her facility on 10/07/2022.  The plan was for intravenous vancomycin with a stop date of 11/12/2022.  She has continued to follow-up with wound care for sacral wound.  She is participating with PT ambulates with a walker.    Review of Systems: As mentioned in the history of present illness. All other systems reviewed and are negative. Past Medical History:  Diagnosis Date   Acute blood loss anemia    Arrhythmia    Arthritis    C. difficile colitis    Cancer (HCC)    COPD (chronic obstructive pulmonary disease) (HCC)    Diverticulosis    Fibromyalgia    GERD (gastroesophageal reflux disease)    GI hemorrhage    H/O: substance abuse (HCC) 2005   narcotic usage due to chronic back pain   Heart murmur    Hypertension  Pancreatic cyst    Pneumococcal meningitis    PONV (postoperative nausea and vomiting)    Renal cyst    Stroke (cerebrum) [facility])    Past Surgical History:  Procedure Laterality Date   ABDOMINAL HYSTERECTOMY     ABDOMINAL SURGERY     APPENDECTOMY     BIOPSY  04/22/2020   Procedure: BIOPSY;  Surgeon: Rachael Fee, MD;  Location: [facility];  Service: Gastroenterology;;   BREAST SURGERY     CHOLECYSTECTOMY N/A  09/30/2012   Procedure: LAPAROSCOPIC CHOLECYSTECTOMY WITH INTRAOPERATIVE CHOLANGIOGRAM;  Surgeon: Wilmon Arms. Corliss Skains, MD;  Location: [facility];  Service: General;  Laterality: N/A;   ESOPHAGOGASTRODUODENOSCOPY (EGD) WITH PROPOFOL N/A 04/22/2020   Procedure: ESOPHAGOGASTRODUODENOSCOPY (EGD) WITH PROPOFOL;  Surgeon: Rachael Fee, MD;  Location: [facility];  Service: Gastroenterology;  Laterality: N/A;   FRACTURE SURGERY     right upper arm   OVARIAN CYST SURGERY     Social History:  reports that she has been smoking cigarettes. She has a 19.6 pack-year smoking history. She has never used smokeless tobacco. She reports that she does not drink alcohol and does not use drugs.  Allergies  Allergen Reactions   Tylenol [Acetaminophen] Other (See Comments)    Inflammed Liver   Codeine Nausea And Vomiting   Nsaids Other (See Comments)    Stomach ulcers   Tolmetin Other (See Comments)    Stomach ulcers   Tramadol Other (See Comments)    GI UPSET    Family History  Problem Relation Age of Onset   Alzheimer's disease Mother    Heart disease Father    Prostate cancer Father     Prior to Admission medications   Medication Sig Start Date End Date Taking? Authorizing Provider  nystatin (MYCOSTATIN) 100000 UNIT/ML suspension Take 5 mLs (500,000 Units total) by mouth 4 (four) times daily for 14 days. Swish and spit 12/01/22 12/15/22 Yes Beatty, Celeste A, PA-C  acetaminophen (TYLENOL) 500 MG tablet Take 2 tablets (1,000 mg total) by mouth every 6 (six) hours as needed for mild pain. 11/15/20   Mancel Bale, MD  albuterol (VENTOLIN HFA) 108 (90 Base) MCG/ACT inhaler Inhale 1-2 puffs into the lungs every 6 (six) hours as needed for wheezing or shortness of breath. 02/28/22   [provider]  ARIPiprazole (ABILIFY) 5 MG tablet Take 5 mg by mouth daily. 02/14/22   [provider]  buprenorphine (SUBUTEX) 2 MG SUBL SL tablet Place 2 tablets (4 mg total) under the tongue every 6 (six) hours  as needed (pain). 03/08/22   Sherryll Burger, Pratik D, DO  carvedilol (COREG) 6.25 MG tablet Take 1 tablet (6.25 mg total) by mouth 2 (two) times daily with a meal. 11/15/20   Mancel Bale, MD  DHEA 10 MG CAPS Take 1 capsule by mouth daily.    [provider]  diclofenac Sodium (VOLTAREN) 1 % GEL Apply 2 g topically 4 (four) times daily. 09/18/22   Carmel Sacramento A, PA-C  donepezil (ARICEPT) 10 MG tablet Take 10 mg by mouth daily. 02/15/22   [provider]  doxepin (SINEQUAN) 50 MG capsule Take 1 capsule (50 mg total) by mouth daily. 03/08/22   Sherryll Burger, Pratik D, DO  GOODSENSE IBUPROFEN 200 MG tablet Take 200 mg by mouth daily. 02/14/22   [provider]  Iodine Strong, Lugols, (STRONG IODINE) 5 % solution Take 1 mL by mouth 3 (three) times a week. 12/10/21   [provider]  LORazepam (ATIVAN) 0.5 MG tablet Take  1 tablet (0.5 mg total) by mouth 2 (two) times daily as needed for anxiety. 03/08/22   Sherryll Burger, Pratik D, DO  losartan (COZAAR) 100 MG tablet Take 100 mg by mouth daily. 02/15/22   [provider]  mirtazapine (REMERON) 7.5 MG tablet Take 7.5 mg by mouth at bedtime. 02/20/22   [provider]  naloxone [facility]) nasal spray 4 mg/0.1 mL Place 1 spray into the nose once. 12/04/21   [provider]  Nutritional Supplements (ENSURE ORIGINAL) LIQD Take 1 Can by mouth in the morning, at noon, in the evening, and at bedtime. 02/01/22   [provider]  OLANZapine (ZYPREXA) 10 MG tablet Take 1 tablet (10 mg total) by mouth at bedtime. 03/08/22   Sherryll Burger, Pratik D, DO  temazepam (RESTORIL) 22.5 MG capsule Take 1 capsule (22.5 mg total) by mouth at bedtime. 03/08/22   Sherryll Burger, Pratik D, DO  traMADol (ULTRAM) 50 MG tablet Take 1 tablet (50 mg total) by mouth every 4 (four) hours as needed for moderate pain. 03/08/22   Sherryll Burger, Pratik D, DO  TRELEGY ELLIPTA 100-62.5-25 MCG/ACT AEPB Inhale 1 puff into the lungs daily. 02/28/22   [provider]  VITAMIN  A PO Take 1 capsule by mouth 3 (three) times a week.    [provider]  Zinc Sulfate 220 (50 Zn) MG TABS Take 0.5 tablets by mouth 3 (three) times a week. 02/14/22   [provider]    Physical Exam: Vitals:   12/02/22 0730 12/02/22 0800 12/02/22 1107 12/02/22 1133  BP: (!) 145/63 138/60 (!) 122/58   Pulse: 92 97 (!) 106   Resp:  20 16   Temp:    99.2 F (37.3 C)  TempSrc:    Oral  SpO2: 97% 97% 94%   Weight:       GENERAL:  A&O x 3, NAD, well developed, cooperative, follows commands HEENT: [place]/AT, No thrush, No icterus, No oral ulcers Neck:  No neck mass, No meningismus, soft, supple CV: RRR, no S3, no S4, no rub, no JVD Lungs: Bilateral rales.  Bilateral wheezing Abd: soft/epigastric abdominal pain;  +BS, nondistended Ext: No edema, no lymphangitis, no cyanosis, no rashes Neuro:  CN II-XII intact, strength 4/5 in RUE, RLE, strength 4/5 LUE, LLE; sensation intact bilateral; no dysmetria; babinski equivocal  Data Reviewed: Data reviewed above in the history  Assessment and Plan: Acute respiratory failure with hypoxia and hypercarbia -12/02/2022 VBG 7.37/59/80/33 -Wean oxygen back to room air as tolerated -Stable on 2 L -COVID PCR negative -Viral respiratory panel -12/02/2022 CTA chest--negative PE, negative infiltrates, negative effusions  COPD exacerbation -Start DuoNebs -Start [employer] -Start Solu-Medrol -Start Pulmicort  Transaminasemia with elevated lipase -Concerned about acute on chronic pancreatitis -GI consult -12/01/2022 CT AP-s/p cholecystectomy.  Stable chronic bili ductal dilatation.  Chronic pancreatic calcifications without stranding.  No bowel obstruction or inflammatory changes -Viral hepatitis panel negative -Trend LFTs -start IVF  Hypokalemia -add KCL to IVF -check mag  Sacral osteomyelitis -Finished 6 weeks of IV vancomycin 11/12/2022 -Sacral wound is clean without any skin breakdown  Cognitive impairment -Continue  Aricept  Depression/anxiety with psychotic features -Continue Abilify -Continue trazodone -Continue olanzapine 10  Essential hypertension -Continue carvedilol  Opioid dependence -PDMP reviewed -Tramadol, #180, last refill 09/25/2022 -Oxycodone 5 mg, #14, last refill 11/12/2022 -UDS  Diarrhea -check cdiff -check stool pathogen panel      Advance Care Planning: FULL  Consults: GI  Family Communication: none  Severity of Illness: The appropriate patient status for  this patient is INPATIENT. Inpatient status is judged to be reasonable and necessary in order to provide the required intensity of service to ensure the patient's safety. The patient's presenting symptoms, physical exam findings, and initial radiographic and laboratory data in the context of their chronic comorbidities is felt to place them at high risk for further clinical deterioration. Furthermore, it is not anticipated that the patient will be medically stable for discharge from the hospital within 2 midnights of admission.   * I certify that at the point of admission it is my clinical judgment that the patient will require inpatient hospital care spanning beyond 2 midnights from the point of admission due to high intensity of service, high risk for further deterioration and high frequency of surveillance required.*  Author: Catarina Hartshorn, MD 12/02/2022 12:09 PM  For on call review [email or web address].

## 2022-12-02 NOTE — ED Notes (Signed)
Assisted pt on and off bed pan

## 2022-12-02 NOTE — Hospital Course (Addendum)
72 year old female with a history of COPD, sacral osteomyelitis with stage IV sacral decubitus, GERD, hypertension, stroke, Streptococcus pneumoniae bacteremia and meningitis 04/2016, and anxiety/anxiety with psychotic features presenting with generalized weakness, decreased oral intake, and central abdominal pain for 2 to 3 days.  The patient has had some associated nausea and vomiting for the same amount of time.  She denies any hematemesis.  She has had some loose stools, stating she had 2 loose stools on the day of admission.  She denies hematochezia or melena.  She is unclear whether she has been started on any new medications. She also complains of some shortness of breath for the last 2 to 3 days.  She continues to smoke 4 cigarettes/day.  She has smoked from 1/2 pack to 1 pack/day x 20 years.  She has nonproductive cough.  She denies any chest pain, hemoptysis.  She has had some subjective fevers and chills.  She denies any headache or neck pain. In the ED, the patient had a low-grade temperature 99.6 F.  She was hemodynamically stable.  Oxygen saturation was 86% room air.  She was placed on 2 L with saturation up to 95%.  CTA chest was negative for PE.  There is no infiltrates or edema.  CT abdomen pelvis was negative for any acute findings.  She is status post cholecystectomy.  There is chronic biliary ductal dilatation with pancreatic calcifications.  There is no pancreatic stranding. WBC 13.1, hemoglobin 14.2, platelets 308.  Sodium 140, potassium 2.9, bicarbonate 30, serum creatinine 0.73.  AST 215, ALT 611, alk phosphatase 376, total bilirubin 1.2.  Viral hepatitis panel is negative.    The patient was recently admitted to [facility] from 09/30/2022 to 10/07/2022 to be initiated on intravenous antibiotics secondary to a positive intraoperative culture with corynebacterium striatum.  Notably, the patient had an MRI of the pelvis on 06/24/2022 which showed sacral osteomyelitis.  She has underwent  excision of the sacral pressure ulcer and ostectomy with bilateral gluteus fasciocutaneous flap closure on 7/16 with plastic surgery at [facility].  She was discharged to skilled nursing facility on 09/24/2022 with p.o. cephalexin.  Subsequently, intraoperative cultures returned positive for corynebacterium striatum.  Because of this, the patient was advised to present for admission.  The patient was started on IV vancomycin and discharged back to her facility on 10/07/2022.  The plan was for intravenous vancomycin with a stop date of 11/12/2022.  She has continued to follow-up with wound care for sacral wound.  She is participating with PT ambulates with a walker.

## 2022-12-02 NOTE — Plan of Care (Signed)

## 2022-12-03 DIAGNOSIS — K838 Other specified diseases of biliary tract: Secondary | ICD-10-CM | POA: Diagnosis not present

## 2022-12-03 DIAGNOSIS — R7989 Other specified abnormal findings of blood chemistry: Secondary | ICD-10-CM | POA: Diagnosis not present

## 2022-12-03 DIAGNOSIS — K861 Other chronic pancreatitis: Secondary | ICD-10-CM

## 2022-12-03 DIAGNOSIS — J441 Chronic obstructive pulmonary disease with (acute) exacerbation: Secondary | ICD-10-CM | POA: Diagnosis not present

## 2022-12-03 DIAGNOSIS — E876 Hypokalemia: Secondary | ICD-10-CM

## 2022-12-03 DIAGNOSIS — J9601 Acute respiratory failure with hypoxia: Secondary | ICD-10-CM | POA: Diagnosis not present

## 2022-12-03 DIAGNOSIS — Z720 Tobacco use: Secondary | ICD-10-CM | POA: Diagnosis not present

## 2022-12-03 LAB — ANA W/REFLEX IF POSITIVE: Anti Nuclear Antibody (ANA): NEGATIVE

## 2022-12-03 LAB — IGG, IGA, IGM
IgA: 121 mg/dL (ref 64–422)
IgG (Immunoglobin G), Serum: 706 mg/dL (ref 586–1602)
IgM (Immunoglobulin M), Srm: 426 mg/dL — ABNORMAL HIGH (ref 26–217)

## 2022-12-03 LAB — PROTIME-INR
INR: 1.1 (ref 0.8–1.2)
Prothrombin Time: 14.2 seconds (ref 11.4–15.2)

## 2022-12-03 LAB — ANTI-SMOOTH MUSCLE ANTIBODY, IGG: F-Actin IgG: 5 Units (ref 0–19)

## 2022-12-03 LAB — CBC
HCT: 38.9 % (ref 36.0–46.0)
Hemoglobin: 12.3 g/dL (ref 12.0–15.0)
MCH: 29.1 pg (ref 26.0–34.0)
MCHC: 31.6 g/dL (ref 30.0–36.0)
MCV: 92 fL (ref 80.0–100.0)
Platelets: 310 10*3/uL (ref 150–400)
RBC: 4.23 MIL/uL (ref 3.87–5.11)
RDW: 13.5 % (ref 11.5–15.5)
WBC: 6.6 10*3/uL (ref 4.0–10.5)
nRBC: 0 % (ref 0.0–0.2)

## 2022-12-03 LAB — CERULOPLASMIN: Ceruloplasmin: 36.6 mg/dL (ref 19.0–39.0)

## 2022-12-03 LAB — COMPREHENSIVE METABOLIC PANEL
ALT: 259 U/L — ABNORMAL HIGH (ref 0–44)
AST: 41 U/L (ref 15–41)
Albumin: 2.8 g/dL — ABNORMAL LOW (ref 3.5–5.0)
Alkaline Phosphatase: 253 U/L — ABNORMAL HIGH (ref 38–126)
Anion gap: 10 (ref 5–15)
BUN: 27 mg/dL — ABNORMAL HIGH (ref 8–23)
CO2: 28 mmol/L (ref 22–32)
Calcium: 8.9 mg/dL (ref 8.9–10.3)
Chloride: 101 mmol/L (ref 98–111)
Creatinine, Ser: 0.61 mg/dL (ref 0.44–1.00)
GFR, Estimated: >60 mL/min (ref >60)
Glucose, Bld: 208 mg/dL — ABNORMAL HIGH (ref 70–99)
Potassium: 4.2 mmol/L (ref 3.5–5.1)
Sodium: 139 mmol/L (ref 135–145)
Total Bilirubin: 0.4 mg/dL (ref 0.3–1.2)
Total Protein: 6 g/dL — ABNORMAL LOW (ref 6.5–8.1)

## 2022-12-03 LAB — MAGNESIUM: Magnesium: 1.9 mg/dL (ref 1.7–2.4)

## 2022-12-03 LAB — MITOCHONDRIAL ANTIBODIES: Mitochondrial M2 Ab, IgG: <20 Units (ref 0.0–20.0)

## 2022-12-03 MED ORDER — METHYLPREDNISOLONE SODIUM SUCC 125 MG IJ SOLR
60.0000 mg | Freq: Two times a day (BID) | INTRAMUSCULAR | Status: DC
  Administered 2022-12-03 – 2022-12-04 (×3): 60 mg via INTRAVENOUS
  Filled 2022-12-03 (×4): qty 2

## 2022-12-03 MED ORDER — PANCRELIPASE (LIP-PROT-AMYL) 12000-38000 UNITS PO CPEP
36000.0000 [IU] | ORAL_CAPSULE | Freq: Three times a day (TID) | ORAL | Status: DC
  Administered 2022-12-03 – 2022-12-05 (×6): 36000 [IU] via ORAL
  Filled 2022-12-03 (×6): qty 3

## 2022-12-03 MED ORDER — ADULT MULTIVITAMIN W/MINERALS CH
1.0000 | ORAL_TABLET | Freq: Every day | ORAL | Status: DC
  Administered 2022-12-03 – 2022-12-05 (×3): 1 via ORAL
  Filled 2022-12-03 (×3): qty 1

## 2022-12-03 MED ORDER — OLANZAPINE 5 MG PO TABS
5.0000 mg | ORAL_TABLET | Freq: Every day | ORAL | Status: DC
  Administered 2022-12-03 – 2022-12-04 (×2): 5 mg via ORAL
  Filled 2022-12-03 (×2): qty 1

## 2022-12-03 MED ORDER — ENSURE ENLIVE PO LIQD
237.0000 mL | Freq: Three times a day (TID) | ORAL | Status: DC
  Administered 2022-12-04 – 2022-12-05 (×3): 237 mL via ORAL

## 2022-12-03 NOTE — Progress Notes (Signed)
Mobility Specialist Progress Note:    12/03/22 1445  Mobility  Activity Ambulated with assistance in hallway  Level of Assistance Standby assist, set-up cues, supervision of patient - no hands on  Assistive Device Front wheel walker  Distance Ambulated (ft) 100 ft  Range of Motion/Exercises Active;All extremities  Activity Response Tolerated well  Mobility Referral Yes  $Mobility charge 1 Mobility  Mobility Specialist Start Time (ACUTE ONLY) 1445  Mobility Specialist Stop Time (ACUTE ONLY) 1455  Mobility Specialist Time Calculation (min) (ACUTE ONLY) 10 min   Pt received in bed, agreeable to mobility. Required SBA to stand and ambulate with RW. Tolerated well, asx throughout. Returned pt to room, all needs met.   Lawerance Bach Mobility Specialist Please contact via Special educational needs teacher or  Rehab office at [phone]

## 2022-12-03 NOTE — TOC Initial Note (Signed)
Transition of Care [facility]) - Initial/Assessment Note    Patient Details  Name: [name] MRN: [id number] Date of Birth: 1950-09-14  [facility] CM/SW Contact:    Villa Herb, LCSWA Phone Number: 12/03/2022, 10:12 AM  Clinical Narrative:                 CSW noted per chart review that pt arrived from [facility]. CSW spoke to facility staff who state that pt has been there for about a year or two now. Pt is independent at baseline with ambulating and doing ADLs. Facility states they will be able to accept pt back tomorrow. If pt needs home O2 it will need to be set up prior to return to facility. TOC to follow.   Expected Discharge Plan: Rest Home Barriers to Discharge: Continued Medical Work up   Patient Goals and CMS Choice Patient states their goals for this hospitalization and ongoing recovery are:: return to family care home CMS Medicare.gov Compare Post Acute Care list provided to:: Patient Choice offered to / list presented to : Patient      Expected Discharge Plan and Services In-house Referral: Clinical Social Work Discharge Planning Services: CM Consult   Living arrangements for the past 2 months: Assisted Living Facility                                      Prior Living Arrangements/Services Living arrangements for the past 2 months: Assisted Living Facility Lives with:: Facility Resident Patient language and need for interpreter reviewed:: Yes Do you feel safe going back to the place where you live?: Yes      Need for Family Participation in Patient Care: Yes (Comment) Care giver support system in place?: Yes (comment)   Criminal Activity/Legal Involvement Pertinent to Current Situation/Hospitalization: No - Comment as needed  Activities of Daily Living Home Assistive Devices/Equipment: Walker (specify type), Dentures (specify type) ADL Screening (condition at time of admission) Is the patient deaf or have difficulty  hearing?: No Does the patient have difficulty seeing, even when wearing glasses/contacts?: No Does the patient have difficulty concentrating, remembering, or making decisions?: No  Permission Sought/Granted                  Emotional Assessment Appearance:: Appears stated age Attitude/Demeanor/Rapport: Engaged Affect (typically observed): Accepting   Alcohol / Substance Use: Not Applicable Psych Involvement: No (comment)  Admission diagnosis:  Hypokalemia [E87.6] Acute respiratory failure with hypoxia (HCC) [J96.01] Generalized weakness [R53.1] Thrush, oral [B37.0] Patient Active Problem List   Diagnosis Date Noted   Opioid dependence (HCC) 12/02/2022   Transaminasemia 12/02/2022   Pressure ulcer of sacral region, stage 4 (HCC) 06/11/2022   RSV (acute bronchiolitis due to respiratory syncytial virus) 02/28/2022   Protein-calorie malnutrition, severe 08/03/2020   COPD with exacerbation (HCC) 08/02/2020   Acute respiratory failure with hypoxia (HCC) 08/01/2020   Severe recurrent major depression with psychotic features (HCC) 07/03/2020   Hallucinations    History of substance abuse (HCC) 05/10/2020   Psychosis, atypical (HCC) 05/09/2020   Shortness of breath 04/20/2020   Chronic diastolic CHF (congestive heart failure) (HCC) 04/20/2020   Nausea & vomiting 04/20/2020   Prolonged QT interval 04/20/2020   COPD with acute exacerbation (HCC) 06/20/2017   DOE (dyspnea on exertion) 06/19/2017   Cervical dystonia 11/27/2016   C. 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/23/2016   Bacterial meningitis 05/15/2016   Gastrointestinal hemorrhage with melena 05/14/2016   History of narcotic use 05/14/2016   Acute encephalopathy    Fever    Left hemiparesis (HCC)    Generalized OA    Fibromyalgia    Tobacco abuse     Substance abuse (HCC)    Benign essential HTN    Tachycardia    Chronic pain syndrome    Acute blood loss anemia    Leukocytosis    Septic thrombophlebitis of sagittal sinus    Acute respiratory failure (HCC)    Streptococcus pneumoniae meningitis    Streptococcal bacteremia    Meningitis    History of stroke 04/12/2016   Stroke (cerebrum) (HCC) 04/12/2016   Neck pain 04/04/2016   Chronic abdominal pain 02/19/2016   Essential hypertension 02/19/2016   Bacterial conjunctivitis 02/19/2016   Health care maintenance 02/19/2016   Anxiety 05/08/2006   Cigarette smoker 05/08/2006   ASTHMA, UNSPECIFIED 05/08/2006   GERD (gastroesophageal reflux disease) 05/08/2006   CONSTIPATION 05/08/2006   CONVULSIONS, SEIZURES, NOS 05/08/2006   PCP:  Frederich Chick., MD Pharmacy:   [id number] - [place], [place] - [address] [place] [address] Phone: [phone] Fax: [phone]  [facility] - [place], [place] - [address] [place] [address] Phone: [phone] Fax: [phone]     Social Determinants of Health (SDOH) Social History: SDOH Screenings   Food Insecurity: No Food Insecurity (12/02/2022)  Housing: Low Risk  (12/02/2022)  Transportation Needs: No Transportation Needs (12/02/2022)  Utilities: Not At Risk (12/02/2022)  Social Connections: Unknown (07/21/2021)   Received from [facility]  Stress: Stress Concern Present (05/10/2020)   Received from [facility]  Tobacco Use: High Risk (12/01/2022)   SDOH Interventions:     Readmission Risk Interventions    12/03/2022   10:10 AM 03/01/2022   11:53 AM  Readmission Risk Prevention Plan  Transportation Screening Complete Complete  Home Care Screening Complete Complete  Medication Review (RN CM)  Complete

## 2022-12-03 NOTE — Progress Notes (Addendum)
Subjective: Reports she still having some right-sided abdominal pain.  About the same.  No worsening, but not really improved.  She is not having any diarrhea.  Reports having more of a formed stool yesterday and no bowel movement today.  She is passing gas.  Some nausea, but no vomiting.  She has not eaten very much today as she hasn't had an appetite.  She does feel that her abdominal pain is worsened by eating.  States this abdominal pain has been going on for a couple of months.   Objective: Vital signs in last 24 hours: Temp:  [97.6 F (36.4 C)-98.5 F (36.9 C)] 97.6 F (36.4 C) (09/24 1414) Pulse Rate:  [60-86] 73 (09/24 1414) Resp:  [12-19] 12 (09/24 1414) BP: (107-152)/(46-82) 107/46 (09/24 1414) SpO2:  [94 %-100 %] 97 % (09/24 1414) Last BM Date : 12/01/22 General:   Alert and oriented, pleasant, NAD.  Head:  Normocephalic and atraumatic. Eyes:  No icterus, sclera clear. Conjuctiva pink. Abdomen:  Bowel sounds present, soft, non-distended. Mild TTP in RUQ and RLQ.  No rebound or guarding. No masses appreciated  Extremities:  Without edema. Psych:  Normal mood and affect.  Intake/Output from previous day: 09/23 0701 - 09/24 0700 In: 106.8 [I.V.:106.8] Out: -  Intake/Output this shift: Total I/O In: 480 [P.O.:480] Out: 500 [Urine:500]  Lab Results: Recent Labs    12/01/22 1353 12/02/22 1249 12/03/22 0410  WBC 13.1* 9.2 6.6  HGB 14.2 12.7 12.3  HCT 45.4 39.8 38.9  PLT 308 298 310   BMET Recent Labs    12/01/22 1353 12/02/22 1249 12/03/22 0410  NA 140 143 139  K 2.9* 3.4* 4.2  CL 98 102 101  CO2 30 29 28   GLUCOSE 69* 70 208*  BUN 29* 20 27*  CREATININE 0.73 0.67 0.61  CALCIUM 9.4 9.1 8.9   LFT Recent Labs    12/01/22 1353 12/02/22 1249 12/03/22 0410  PROT 6.8 6.0* 6.0*  ALBUMIN 3.5 3.1* 2.8*  AST 215* 77* 41  ALT 611* 347* 259*  ALKPHOS 376* 283* 253*  BILITOT 1.2 1.0 0.4   PT/INR Recent Labs    12/02/22 1712 12/03/22 0410  LABPROT  14.5 14.2  INR 1.1 1.1   Hepatitis Panel Recent Labs    12/01/22 1901  HEPBSAG NON REACTIVE  HCVAB NON REACTIVE  HEPAIGM NON REACTIVE  HEPBIGM NON REACTIVE   Studies/Results: MR ABDOMEN WITH MRCP W CONTRAST  Result Date: 12/03/2022 CLINICAL DATA:  Evaluate common bile duct dilatation EXAM: MRI ABDOMEN WITH CONTRAST (WITH MRCP) TECHNIQUE: Multiplanar multisequence MR imaging of the abdomen was performed following the administration of intravenous contrast. Heavily T2-weighted images of the biliary and pancreatic ducts were obtained, and three-dimensional MRCP images were rendered by post processing. CONTRAST:  5mL GADAVIST GADOBUTROL 1 MMOL/ML IV SOLN COMPARISON:  12/01/2022 FINDINGS: Lower chest: Trace left pleural effusion. Hepatobiliary: No focal enhancing liver lesions. Cholecystectomy. Chronic intrahepatic and common bile duct dilatation is identified. The proximal CBD measures 1.1 cm in the distal common bile duct measures 1.1 cm. Abrupt decreased caliber of the distal common bile duct is noted at the and Biel. Although limited by mild motion artifact there are no signs to indicate choledocholithiasis. Pancreas: No signs of pancreatic inflammation. The main pancreatic duct is ectatic measuring up to 3 mm. Multiple uniformly T2 hyperintense cystic lesions are identified. The majority of these all measure less than 1 cm. The largest is in the tail of pancreas measuring 8 mm, image  21/14. Spleen:  Within normal limits in size and appearance. Adrenals/Urinary Tract:  Normal adrenal glands. No hydronephrosis. Numerous kidney cysts are identified bilaterally. These are too numerous to count. The majority of these are uniformly T2 hyperintense compatible with benign Bosniak class 1 lesions. Cluster of subjacent cysts with intrinsic T1 hyperintensity are identified within the upper pole of the left kidney compatible with hemorrhagic cysts, image 37/20. Exophytic, uniformly T1 hyperintense cyst off the  anterolateral cortex of the lower pole of right kidney measures 1.7 cm, image 66/2. This is also compatible with a hemorrhagic cyst. No follow-up imaging of the cysts recommended. Stomach/Bowel: Visualized portions within the abdomen are unremarkable. Vascular/Lymphatic: The upper abdominal vascularity appears patent. No abdominal adenopathy. Other:  No ascites. Musculoskeletal: Superior endplate compression fractures are identified involving the T11, T12, L1 and L3 vertebra. Up to 40% of the vertebral body height at these levels, most severe at L1. There is loss of there is associated endplate enhancement identified at these levels with mild paraspinal edema at T11 through L1. IMPRESSION: 1. Chronic intrahepatic and common bile duct dilatation is identified. Abrupt decreased caliber of the distal common bile duct is noted at the ampulla. Although limited by mild motion artifact there are no signs to indicate choledocholithiasis or obstructing mass. 2. Multiple uniformly T2 hyperintense cystic lesions are identified within the pancreas. The majority of these all measure less than 1 cm. The largest is in the tail of pancreas measuring 8 mm. These are likely side branch IPMNs. Recommend follow-up pancreas protocol MRI in 2 years. 3. Superior endplate compression fractures are identified involving the T11, T12, L1 and L3 vertebra. Up to 40% of the vertebral body height at these levels, most severe at L1. There is associated endplate enhancement and mild paraspinal edema at T11 through L1. These are likely subacute. 4. Trace left pleural effusion. Electronically Signed   By: Signa Kell M.D.   On: 12/03/2022 05:06    Assessment: 72 y.o. year old with multiple medical issues as noted above, admitted with COPD exacerbation and found to have elevated LFTs and concern for acute on chronic pancreatitis. She is a difficult historian and resides in an assisted living facility.   Elevated LFTs/abdominal pain:   Fluctuating elevated transaminases and alk phos over the years with normalization in between.  In July, AST and ALT were normal, alk phos mildly elevated at 134.  On admission, AST 215, ALT 611, alk phos 376.  Today, AST 41, ALT 259, alk phos 253.  Additional serologies were ordered.  ANA, ASMA, AMA all negative.  No iron overload.  Ceruloplasmin, alpha 1 antitrypsin phenotype, immunoglobulins in process. CT this admission showed chronic diffuse biliary dilatation and evidence of chronic pancreatitis. Nothing to suggest acute pancreatitis.  MRI/MRCP yesterday with chronic intrahepatic and common bile duct dilation, abrupt decreased caliber of the distal common bile duct at the ampulla without signs to indicate choledocholithiasis or obstructing mass though exam limited by motion artifact.  Also with multiple small cystic lesions within the pancreas suspected to be IPMN's.  Clinically, patient currently endorsing a couple of months of right sided abdominal pain that is worsened postprandially.  Associated nausea with intermittent vomiting.   Overall, LFT elevation in conjunction with right sided abdominal pain is concerning for biliary etiology. As LFTs are improving, recommend continuing to monitor while inpatient, but she should have EUS outpatient to take a closer look at the biliary tree. If unable to tolerate po, may need to consider inpatient evaluation.  Pancreatic Cysts:  Needs follow-up MRI in 2 years.   Overdue for surveillance colonoscopy:  Recommend this as outpatient with primary GI team.    Plan: Low fat diet as tolerated.  Follow-up on pending serologies.  Continue to trend LFTs Outpatient EUS with primary GI team [facility]) Will add pancreatic enzymes for chronic pancreatitis. Due to low BMI, will only receive 36000 units with meals.  Outpatient colonoscopy with primary GI.  MRI in 2 year to follow-up on pancreatic cyst.    LOS: 1 day    12/03/2022, 4:39  PM   Ermalinda Memos, PA-C [facility]

## 2022-12-03 NOTE — Progress Notes (Signed)
PROGRESS NOTE  [name] EAV:[id number] DOB: September 17, 1950 DOA: 12/01/2022 PCP: Frederich Chick., MD  Brief History:  72 year old female with a history of COPD, sacral osteomyelitis with stage IV sacral decubitus, GERD, hypertension, stroke, Streptococcus pneumoniae bacteremia and meningitis 04/2016, and anxiety/anxiety with psychotic features presenting with generalized weakness, decreased oral intake, and central abdominal pain for 2 to 3 days.  The patient has had some associated nausea and vomiting for the same amount of time.  She denies any hematemesis.  She has had some loose stools, stating she had 2 loose stools on the day of admission.  She denies hematochezia or melena.  She is unclear whether she has been started on any new medications. She also complains of some shortness of breath for the last 2 to 3 days.  She continues to smoke 4 cigarettes/day.  She has smoked from 1/2 pack to 1 pack/day x 20 years.  She has nonproductive cough.  She denies any chest pain, hemoptysis.  She has had some subjective fevers and chills.  She denies any headache or neck pain. In the ED, the patient had a low-grade temperature 99.6 F.  She was hemodynamically stable.  Oxygen saturation was 86% room air.  She was placed on 2 L with saturation up to 95%.  CTA chest was negative for PE.  There is no infiltrates or edema.  CT abdomen pelvis was negative for any acute findings.  She is status post cholecystectomy.  There is chronic biliary ductal dilatation with pancreatic calcifications.  There is no pancreatic stranding. WBC 13.1, hemoglobin 14.2, platelets 308.  Sodium 140, potassium 2.9, bicarbonate 30, serum creatinine 0.73.  AST 215, ALT 611, alk phosphatase 376, total bilirubin 1.2.  Viral hepatitis panel is negative.    The patient was recently admitted to [facility] from 09/30/2022 to 10/07/2022 to be initiated on intravenous antibiotics secondary to a positive intraoperative culture with  corynebacterium striatum.  Notably, the patient had an MRI of the pelvis on 06/24/2022 which showed sacral osteomyelitis.  She has underwent excision of the sacral pressure ulcer and ostectomy with bilateral gluteus fasciocutaneous flap closure on 7/16 with plastic surgery at [facility].  She was discharged to skilled nursing facility on 09/24/2022 with p.o. cephalexin.  Subsequently, intraoperative cultures returned positive for corynebacterium striatum.  Because of this, the patient was advised to present for admission.  The patient was started on IV vancomycin and discharged back to her facility on 10/07/2022.  The plan was for intravenous vancomycin with a stop date of 11/12/2022.  She has continued to follow-up with wound care for sacral wound.  She is participating with PT ambulates with a walker.     Assessment/Plan:  Acute respiratory failure with hypoxia and hypercarbia -12/02/2022 VBG 7.37/59/80/33 -Wean oxygen back to room air as tolerated -Stable on 2 L>>weaned to RA -COVID PCR negative -Viral respiratory panel--neg -12/02/2022 CTA chest--negative PE, negative infiltrates, negative effusions   COPD exacerbation -continue DuoNebs -continue Brovana -continue Solu-Medrol -continue Pulmicort   Transaminasemia with elevated lipase -Concerned about acute on chronic pancreatitis -GI consult appreciated -12/01/2022 CT AP-s/p cholecystectomy.  Stable chronic bili ductal dilatation.  Chronic pancreatic calcifications without stranding.  No bowel obstruction or inflammatory changes -Viral hepatitis panel negative -Trend LFTs--trending down -continue IVF -12/02/22 MRCP--Chronic intrahepatic and common bile duct dilatation; no signs to indicate choledocholithiasis or obstructing mass.   Hypokalemia -add KCL to IVF -check mag 1.9  Pancreatic IPMNs -12/02/22  MR--Multiple uniformly T2 hyperintense cystic lesions are identified within the pancreas   Sacral osteomyelitis -Finished 6 weeks of IV  vancomycin 11/12/2022 -Sacral wound is clean without any skin breakdown   Cognitive impairment -Continue Aricept   Depression/anxiety with psychotic features -Continue Abilify -Continue trazodone -Continue olanzapine   Essential hypertension -Continue carvedilol -hold losartan due to soft BPs   Opioid dependence -PDMP reviewed -Tramadol, #180, last refill 09/25/2022 -Oxycodone 5 mg, #14, last refill 11/12/2022 -UDS   Diarrhea -check cdiff--no BM to collect -check stool pathogen panel--no BM to collect           Family Communication:  no Family at bedside  Consultants:  GI  Code Status:  FULL   DVT Prophylaxis:  Parker Lovenox   Procedures: As Listed in Progress Note Above  Antibiotics: None       Subjective: Pt complains of right side abd pain.  Denies n/v/d,  no cp, f/c.  Breathing is improving.   Objective: Vitals:   12/03/22 0819 12/03/22 1012 12/03/22 1344 12/03/22 1414  BP:  (!) 110/51  (!) 107/46  Pulse:  60  73  Resp:  16  12  Temp:  97.8 F (36.6 C)  97.6 F (36.4 C)  TempSrc:  Oral    SpO2: 100% 95% 94% 97%  Weight:      Height:        Intake/Output Summary (Last 24 hours) at 12/03/2022 1526 Last data filed at 12/03/2022 1300 Gross per 24 hour  Intake 586.79 ml  Output 500 ml  Net 86.79 ml   Weight change: 5.276 kg Exam:  General:  Pt is alert, follows commands appropriately, not in acute distress HEENT: No icterus, No thrush, No neck mass, /AT Cardiovascular: RRR, S1/S2, no rubs, no gallops Respiratory: bibasilar rales.  Bibasilar wheeze Abdomen: Soft/+BS, right side tender, non distended, no guarding Extremities: No edema, No lymphangitis, No petechiae, No rashes, no synovitis   Data Reviewed: I have personally reviewed following labs and imaging studies Basic Metabolic Panel: Recent Labs  Lab 12/01/22 1353 12/02/22 1249 12/03/22 0410  NA 140 143 139  K 2.9* 3.4* 4.2  CL 98 102 101  CO2 30 29 28   GLUCOSE 69* 70 208*   BUN 29* 20 27*  CREATININE 0.73 0.67 0.61  CALCIUM 9.4 9.1 8.9  MG  --  1.8 1.9   Liver Function Tests: Recent Labs  Lab 12/01/22 1353 12/02/22 1249 12/03/22 0410  AST 215* 77* 41  ALT 611* 347* 259*  ALKPHOS 376* 283* 253*  BILITOT 1.2 1.0 0.4  PROT 6.8 6.0* 6.0*  ALBUMIN 3.5 3.1* 2.8*   Recent Labs  Lab 12/01/22 1450  LIPASE 60*   No results for input(s): "AMMONIA" in the last 168 hours. Coagulation Profile: Recent Labs  Lab 12/02/22 1712 12/03/22 0410  INR 1.1 1.1   CBC: Recent Labs  Lab 12/01/22 1353 12/02/22 1249 12/03/22 0410  WBC 13.1* 9.2 6.6  NEUTROABS 10.4* 7.3  --   HGB 14.2 12.7 12.3  HCT 45.4 39.8 38.9  MCV 93.8 92.6 92.0  PLT 308 298 310   Cardiac Enzymes: Recent Labs  Lab 12/02/22 1249  CKTOTAL 43   BNP: Invalid input(s): "POCBNP" CBG: Recent Labs  Lab 12/01/22 1754 12/01/22 1920  GLUCAP 58* 77   HbA1C: No results for input(s): "HGBA1C" in the last 72 hours. Urine analysis:    Component Value Date/Time   COLORURINE YELLOW 12/01/2022 1328   APPEARANCEUR CLOUDY (A) 12/01/2022 1328   LABSPEC  1.016 12/01/2022 1328   PHURINE 5.0 12/01/2022 1328   GLUCOSEU NEGATIVE 12/01/2022 1328   HGBUR NEGATIVE 12/01/2022 1328   BILIRUBINUR NEGATIVE 12/01/2022 1328   KETONESUR 20 (A) 12/01/2022 1328   PROTEINUR 30 (A) 12/01/2022 1328   UROBILINOGEN 0.2 05/26/2013 1730   NITRITE NEGATIVE 12/01/2022 1328   LEUKOCYTESUR NEGATIVE 12/01/2022 1328   Sepsis Labs: @LABRCNTIP (procalcitonin:4,lacticidven:4) ) Recent Results (from the past 240 hour(s))  Resp panel by RT-PCR (RSV, Flu A&B, Covid) Anterior Nasal Swab     Status: None   Collection Time: 12/01/22  1:27 PM   Specimen: Anterior Nasal Swab  Result Value Ref Range Status   SARS Coronavirus 2 by RT PCR NEGATIVE NEGATIVE Final    Comment: (NOTE) SARS-CoV-2 target nucleic acids are NOT DETECTED.  The SARS-CoV-2 RNA is generally detectable in upper respiratory specimens during the acute  phase of infection. The lowest concentration of SARS-CoV-2 viral copies this assay can detect is 138 copies/mL. A negative result does not preclude SARS-Cov-2 infection and should not be used as the sole basis for treatment or other patient management decisions. A negative result may occur with  improper specimen collection/handling, submission of specimen other than nasopharyngeal swab, presence of viral mutation(s) within the areas targeted by this assay, and inadequate number of viral copies(<138 copies/mL). A negative result must be combined with clinical observations, patient history, and epidemiological information. The expected result is Negative.  Fact Sheet for Patients:  BloggerCourse.com  Fact Sheet for Healthcare Providers:  SeriousBroker.it  This test is no t yet approved or cleared by the [place] FDA and  has been authorized for detection and/or diagnosis of SARS-CoV-2 by FDA under an Emergency Use Authorization (EUA). This EUA will remain  in effect (meaning this test can be used) for the duration of the COVID-19 declaration under Section 564(b)(1) of the Act, 21 U.S.C.section 360bbb-3(b)(1), unless the authorization is terminated  or revoked sooner.       Influenza A by PCR NEGATIVE NEGATIVE Final   Influenza B by PCR NEGATIVE NEGATIVE Final    Comment: (NOTE) The Xpert Xpress SARS-CoV-2/FLU/RSV plus assay is intended as an aid in the diagnosis of influenza from Nasopharyngeal swab specimens and should not be used as a sole basis for treatment. Nasal washings and aspirates are unacceptable for Xpert Xpress SARS-CoV-2/FLU/RSV testing.  Fact Sheet for Patients: BloggerCourse.com  Fact Sheet for Healthcare Providers: SeriousBroker.it  This test is not yet approved or cleared by the [place] FDA and has been authorized for detection and/or diagnosis of  SARS-CoV-2 by FDA under an Emergency Use Authorization (EUA). This EUA will remain in effect (meaning this test can be used) for the duration of the COVID-19 declaration under Section 564(b)(1) of the Act, 21 U.S.C. section 360bbb-3(b)(1), unless the authorization is terminated or revoked.     Resp Syncytial Virus by PCR NEGATIVE NEGATIVE Final    Comment: (NOTE) Fact Sheet for Patients: BloggerCourse.com  Fact Sheet for Healthcare Providers: SeriousBroker.it  This test is not yet approved or cleared by the [place] FDA and has been authorized for detection and/or diagnosis of SARS-CoV-2 by FDA under an Emergency Use Authorization (EUA). This EUA will remain in effect (meaning this test can be used) for the duration of the COVID-19 declaration under Section 564(b)(1) of the Act, 21 U.S.C. section 360bbb-3(b)(1), unless the authorization is terminated or revoked.  Performed at [facility], [address], [place], [place] [address]   Culture, blood (routine x 2)  Status: None (Preliminary result)   Collection Time: 12/02/22 10:26 AM   Specimen: BLOOD  Result Value Ref Range Status   Specimen Description BLOOD rt wrist  Final   Special Requests   Final    BOTTLES DRAWN AEROBIC AND ANAEROBIC Blood Culture results may not be optimal due to an excessive volume of blood received in culture bottles   Culture   Final    NO GROWTH < 24 HOURS Performed at [facility], [address], [place], [place] [address]    Report Status PENDING  Incomplete  Culture, blood (routine x 2)     Status: None (Preliminary result)   Collection Time: 12/02/22 10:34 AM   Specimen: BLOOD  Result Value Ref Range Status   Specimen Description BLOOD rt hand  Final   Special Requests   Final    BOTTLES DRAWN AEROBIC AND ANAEROBIC Blood Culture results may not be optimal due to an excessive volume of blood received in culture bottles   Culture   Final     NO GROWTH < 24 HOURS Performed at [facility], [address], [place], [place] [address]    Report Status PENDING  Incomplete  Respiratory (~20 pathogens) panel by PCR     Status: None   Collection Time: 12/02/22  3:50 PM   Specimen: Nasopharyngeal Swab; Respiratory  Result Value Ref Range Status   Adenovirus NOT DETECTED NOT DETECTED Final   Coronavirus 229E NOT DETECTED NOT DETECTED Final    Comment: (NOTE) The Coronavirus on the Respiratory Panel, DOES NOT test for the novel  Coronavirus (2019 nCoV)    Coronavirus HKU1 NOT DETECTED NOT DETECTED Final   Coronavirus NL63 NOT DETECTED NOT DETECTED Final   Coronavirus OC43 NOT DETECTED NOT DETECTED Final   Metapneumovirus NOT DETECTED NOT DETECTED Final   Rhinovirus / Enterovirus NOT DETECTED NOT DETECTED Final   Influenza A NOT DETECTED NOT DETECTED Final   Influenza B NOT DETECTED NOT DETECTED Final   Parainfluenza Virus 1 NOT DETECTED NOT DETECTED Final   Parainfluenza Virus 2 NOT DETECTED NOT DETECTED Final   Parainfluenza Virus 3 NOT DETECTED NOT DETECTED Final   Parainfluenza Virus 4 NOT DETECTED NOT DETECTED Final   Respiratory Syncytial Virus NOT DETECTED NOT DETECTED Final   Bordetella pertussis NOT DETECTED NOT DETECTED Final   Bordetella Parapertussis NOT DETECTED NOT DETECTED Final   Chlamydophila pneumoniae NOT DETECTED NOT DETECTED Final   Mycoplasma pneumoniae NOT DETECTED NOT DETECTED Final    Comment: Performed at [facility], [address], [place], [place] [address]     Scheduled Meds:  arformoterol  15 mcg Nebulization BID   ARIPiprazole  5 mg Oral Daily   budesonide (PULMICORT) nebulizer solution  0.5 mg Nebulization BID   carvedilol  6.25 mg Oral BID WC   donepezil  10 mg Oral QHS   enoxaparin (LOVENOX) injection  40 mg Subcutaneous Q24H   feeding supplement  237 mL Oral BID BM   ipratropium-albuterol  3 mL Nebulization TID   methylPREDNISolone (SOLU-MEDROL) injection  60 mg Intravenous Q12H    mirtazapine  7.5 mg Oral QHS   Continuous Infusions:  0.9 % NaCl with KCl 20 mEq / L 100 mL/hr at 12/03/22 0503    Procedures/Studies: MR ABDOMEN WITH MRCP W CONTRAST  Result Date: 12/03/2022 CLINICAL DATA:  Evaluate common bile duct dilatation EXAM: MRI ABDOMEN WITH CONTRAST (WITH MRCP) TECHNIQUE: Multiplanar multisequence MR imaging of the abdomen was performed following the administration of intravenous  contrast. Heavily T2-weighted images of the biliary and pancreatic ducts were obtained, and three-dimensional MRCP images were rendered by post processing. CONTRAST:  5mL GADAVIST GADOBUTROL 1 MMOL/ML IV SOLN COMPARISON:  12/01/2022 FINDINGS: Lower chest: Trace left pleural effusion. Hepatobiliary: No focal enhancing liver lesions. Cholecystectomy. Chronic intrahepatic and common bile duct dilatation is identified. The proximal CBD measures 1.1 cm in the distal common bile duct measures 1.1 cm. Abrupt decreased caliber of the distal common bile duct is noted at the and Biel. Although limited by mild motion artifact there are no signs to indicate choledocholithiasis. Pancreas: No signs of pancreatic inflammation. The main pancreatic duct is ectatic measuring up to 3 mm. Multiple uniformly T2 hyperintense cystic lesions are identified. The majority of these all measure less than 1 cm. The largest is in the tail of pancreas measuring 8 mm, image 21/14. Spleen:  Within normal limits in size and appearance. Adrenals/Urinary Tract:  Normal adrenal glands. No hydronephrosis. Numerous kidney cysts are identified bilaterally. These are too numerous to count. The majority of these are uniformly T2 hyperintense compatible with benign Bosniak class 1 lesions. Cluster of subjacent cysts with intrinsic T1 hyperintensity are identified within the upper pole of the left kidney compatible with hemorrhagic cysts, image 37/20. Exophytic, uniformly T1 hyperintense cyst off the anterolateral cortex of the lower pole of  right kidney measures 1.7 cm, image 66/2. This is also compatible with a hemorrhagic cyst. No follow-up imaging of the cysts recommended. Stomach/Bowel: Visualized portions within the abdomen are unremarkable. Vascular/Lymphatic: The upper abdominal vascularity appears patent. No abdominal adenopathy. Other:  No ascites. Musculoskeletal: Superior endplate compression fractures are identified involving the T11, T12, L1 and L3 vertebra. Up to 40% of the vertebral body height at these levels, most severe at L1. There is loss of there is associated endplate enhancement identified at these levels with mild paraspinal edema at T11 through L1. IMPRESSION: 1. Chronic intrahepatic and common bile duct dilatation is identified. Abrupt decreased caliber of the distal common bile duct is noted at the ampulla. Although limited by mild motion artifact there are no signs to indicate choledocholithiasis or obstructing mass. 2. Multiple uniformly T2 hyperintense cystic lesions are identified within the pancreas. The majority of these all measure less than 1 cm. The largest is in the tail of pancreas measuring 8 mm. These are likely side branch IPMNs. Recommend follow-up pancreas protocol MRI in 2 years. 3. Superior endplate compression fractures are identified involving the T11, T12, L1 and L3 vertebra. Up to 40% of the vertebral body height at these levels, most severe at L1. There is associated endplate enhancement and mild paraspinal edema at T11 through L1. These are likely subacute. 4. Trace left pleural effusion. Electronically Signed   By: Signa Kell M.D.   On: 12/03/2022 05:06   CT ABDOMEN PELVIS W CONTRAST  Result Date: 12/01/2022 CLINICAL DATA:  Diffuse abdominal pain and anorexia for 3 days. Elevated liver function tests. EXAM: CT ABDOMEN AND PELVIS WITH CONTRAST TECHNIQUE: Multidetector CT imaging of the abdomen and pelvis was performed using the standard protocol following bolus administration of intravenous  contrast. RADIATION DOSE REDUCTION: This exam was performed according to the departmental dose-optimization program which includes automated exposure control, adjustment of the mA and/or kV according to patient size and/or use of iterative reconstruction technique. CONTRAST:  75 mL OMNIPAQUE IOHEXOL 350 MG/ML SOLN COMPARISON:  12/17/2020 FINDINGS: Lower Chest: No acute findings. Hepatobiliary: No suspicious hepatic masses identified. Prior cholecystectomy again noted. Diffuse biliary ductal dilatation  remains unchanged. Pancreas: No evidence of pancreatic mass or acute pancreatitis. Scattered tiny pancreatic calcifications are noted, consistent with chronic pancreatitis. No evidence of peripancreatic fluid collections. Spleen: Within normal limits in size and appearance. Adrenals/Urinary Tract: Multiple small benign-appearing renal cysts are again seen bilaterally (No followup imaging is recommended). No suspicious masses identified. No evidence of ureteral calculi or hydronephrosis. Stomach/Bowel: No evidence of obstruction, inflammatory process or abnormal fluid collections. Diverticulosis is seen mainly involving the sigmoid colon, however there is no evidence of diverticulitis. Vascular/Lymphatic: No pathologically enlarged lymph nodes. No acute vascular findings. Reproductive: Prior hysterectomy noted. Adnexal regions are unremarkable in appearance. Other:  None. Musculoskeletal: No suspicious bone lesions identified. Multiple lower thoracic and lumbar vertebral body compression fractures are seen, which are chronic in appearance IMPRESSION: No acute findings within the abdomen or pelvis. Prior cholecystectomy. Stable chronic diffuse biliary ductal dilatation. Chronic pancreatitis. Colonic diverticulosis, without radiographic evidence of diverticulitis. Electronically Signed   By: Danae Orleans M.D.   On: 12/01/2022 16:51   CT Angio Chest PE W and/or Wo Contrast  Result Date: 12/01/2022 CLINICAL DATA:  Chest  pain and weakness shortness of breath. Probability for pulmonary embolism. EXAM: CT ANGIOGRAPHY CHEST WITH CONTRAST TECHNIQUE: Multidetector CT imaging of the chest was performed using the standard protocol during bolus administration of intravenous contrast. Multiplanar CT image reconstructions and MIPs were obtained to evaluate the vascular anatomy. RADIATION DOSE REDUCTION: This exam was performed according to the departmental dose-optimization program which includes automated exposure control, adjustment of the mA and/or kV according to patient size and/or use of iterative reconstruction technique. CONTRAST:  75 mL OMNIPAQUE IOHEXOL 350 MG/ML SOLN COMPARISON:  None Available. FINDINGS: Cardiovascular: Satisfactory opacification of pulmonary arteries noted, and no pulmonary emboli identified. No evidence of thoracic aortic dissection or aneurysm. Mediastinum/Nodes: No masses or pathologically enlarged lymph nodes identified. Lungs/Pleura: No pulmonary mass, infiltrate, or effusion. Upper abdomen: No acute findings. Musculoskeletal: No suspicious bone lesions identified. Review of the MIP images confirms the above findings. IMPRESSION: Negative. No evidence of pulmonary embolism or other acute findings. Electronically Signed   By: Danae Orleans M.D.   On: 12/01/2022 16:43    Catarina Hartshorn, DO  [facility]  If 7PM-7AM, please contact night-coverage [email or web address] Password [email or web address] 12/03/2022, 3:26 PM   LOS: 1 day

## 2022-12-03 NOTE — Progress Notes (Signed)
Initial Nutrition Assessment  DOCUMENTATION CODES:   Underweight, Non-severe (moderate) malnutrition in context of chronic illness  INTERVENTION:   Ensure Plus High Protein po BID, each supplement provides 350 kcal and 20 grams of protein. MVI with minerals daily.  NUTRITION DIAGNOSIS:   Moderate Malnutrition related to chronic illness as evidenced by moderate muscle depletion, moderate fat depletion.  GOAL:   Patient will meet greater than or equal to 90% of their needs  MONITOR:   PO intake, Supplement acceptance  REASON FOR ASSESSMENT:   Malnutrition Screening Tool    ASSESSMENT:   72 yo female admitted with COPD exacerbation. PMH includes current smoker, COPD, sacral osteomyelitis (S/P flap closure of stage IV wound on 09/24/22), GERD, HTN, fibromyalgia, stroke, diverticulosis, pancreatic cyst, renal cyst.   Patient reports that she weighed 112 lbs not long ago and now she is down to 105 lbs. Weight yesterday was 46.1 kg (101.6 lbs). She has been eating poorly at home d/t no appetite for several months. She likes Ensure. Meal completions documented at 100% of breakfast and lunch today.   Labs reviewed.  Medications reviewed and include Solumedrol, Remeron. IVF: NS with KCl 20 mEq/L at 100 ml/h  NUTRITION - FOCUSED PHYSICAL EXAM:  Flowsheet Row Most Recent Value  Orbital Region Moderate depletion  Upper Arm Region Moderate depletion  Thoracic and Lumbar Region Moderate depletion  Buccal Region Mild depletion  Temple Region Severe depletion  Clavicle Bone Region Severe depletion  Clavicle and Acromion Bone Region Severe depletion  Scapular Bone Region Severe depletion  Dorsal Hand Moderate depletion  Patellar Region Moderate depletion  Anterior Thigh Region Moderate depletion  Posterior Calf Region Moderate depletion  Edema (RD Assessment) None  Hair Reviewed  Eyes Reviewed  Mouth Reviewed  Skin Reviewed  Nails Reviewed       Diet Order:   Diet Order              Diet regular Room service appropriate? Yes; Fluid consistency: Thin  Diet effective now                   EDUCATION NEEDS:   No education needs have been identified at this time  Skin:  Skin Assessment: Reviewed RN Assessment  Last BM:  9/22  Height:   Ht Readings from Last 1 Encounters:  12/02/22 5\' 4"  (1.626 m)    Weight:   Wt Readings from Last 1 Encounters:  12/02/22 46.1 kg    Ideal Body Weight:  54.5 kg  BMI:  Body mass index is 17.45 kg/m.  Estimated Nutritional Needs:   Kcal:  1400-1600  Protein:  65-80 gm  Fluid:  1.4-1.6 L   Gabriel Rainwater RD, LDN, CNSC Please refer to Amion for contact information.

## 2022-12-04 DIAGNOSIS — J9601 Acute respiratory failure with hypoxia: Secondary | ICD-10-CM | POA: Diagnosis not present

## 2022-12-04 DIAGNOSIS — R7989 Other specified abnormal findings of blood chemistry: Secondary | ICD-10-CM | POA: Diagnosis not present

## 2022-12-04 DIAGNOSIS — R109 Unspecified abdominal pain: Secondary | ICD-10-CM | POA: Diagnosis not present

## 2022-12-04 DIAGNOSIS — K861 Other chronic pancreatitis: Secondary | ICD-10-CM | POA: Diagnosis not present

## 2022-12-04 DIAGNOSIS — E440 Moderate protein-calorie malnutrition: Secondary | ICD-10-CM | POA: Insufficient documentation

## 2022-12-04 LAB — COMPREHENSIVE METABOLIC PANEL
ALT: 196 U/L — ABNORMAL HIGH (ref 0–44)
AST: 25 U/L (ref 15–41)
Albumin: 2.9 g/dL — ABNORMAL LOW (ref 3.5–5.0)
Alkaline Phosphatase: 227 U/L — ABNORMAL HIGH (ref 38–126)
Anion gap: 9 (ref 5–15)
BUN: 25 mg/dL — ABNORMAL HIGH (ref 8–23)
CO2: 29 mmol/L (ref 22–32)
Calcium: 9.3 mg/dL (ref 8.9–10.3)
Chloride: 103 mmol/L (ref 98–111)
Creatinine, Ser: 0.52 mg/dL (ref 0.44–1.00)
GFR, Estimated: >60 mL/min (ref >60)
Glucose, Bld: 157 mg/dL — ABNORMAL HIGH (ref 70–99)
Potassium: 4 mmol/L (ref 3.5–5.1)
Sodium: 141 mmol/L (ref 135–145)
Total Bilirubin: 0.2 mg/dL — ABNORMAL LOW (ref 0.3–1.2)
Total Protein: 5.8 g/dL — ABNORMAL LOW (ref 6.5–8.1)

## 2022-12-04 LAB — CBC
HCT: 38.5 % (ref 36.0–46.0)
Hemoglobin: 12.2 g/dL (ref 12.0–15.0)
MCH: 29.2 pg (ref 26.0–34.0)
MCHC: 31.7 g/dL (ref 30.0–36.0)
MCV: 92.1 fL (ref 80.0–100.0)
Platelets: 320 10*3/uL (ref 150–400)
RBC: 4.18 MIL/uL (ref 3.87–5.11)
RDW: 13.7 % (ref 11.5–15.5)
WBC: 12.6 10*3/uL — ABNORMAL HIGH (ref 4.0–10.5)
nRBC: 0 % (ref 0.0–0.2)

## 2022-12-04 LAB — MAGNESIUM: Magnesium: 1.9 mg/dL (ref 1.7–2.4)

## 2022-12-04 MED ORDER — PANCRELIPASE (LIP-PROT-AMYL) 36000-114000 UNITS PO CPEP: 36000.0000 [IU] | ORAL_CAPSULE | Freq: Three times a day (TID) | ORAL | Status: DC

## 2022-12-04 MED ORDER — PREDNISONE 20 MG PO TABS: 40.0000 mg | ORAL_TABLET | Freq: Every day | ORAL | Status: AC

## 2022-12-04 NOTE — Progress Notes (Signed)
Mobility Specialist Progress Note:    12/04/22 0930  Mobility  Activity Ambulated with assistance in hallway;Transferred from bed to chair  Level of Assistance Standby assist, set-up cues, supervision of patient - no hands on  Assistive Device Front wheel walker  Distance Ambulated (ft) 220 ft  Range of Motion/Exercises Active;All extremities  Activity Response Tolerated well  Mobility Referral Yes  $Mobility charge 1 Mobility  Mobility Specialist Start Time (ACUTE ONLY) 0930  Mobility Specialist Stop Time (ACUTE ONLY) 0945  Mobility Specialist Time Calculation (min) (ACUTE ONLY) 15 min   Pt received in bed, agreeable to mobility. Required supervision to stand and ambulate with RW. Tolerated well, pt fatigued at EOS. Returned pt to room, left in chair. Call bell in reach, alarm on, all needs met.   Lawerance Bach Mobility Specialist Please contact via Special educational needs teacher or  Rehab office at [phone]

## 2022-12-04 NOTE — Care Management Important Message (Signed)
Important Message  Patient Details  Name: [name] MRN: [id number] Date of Birth: [birth year]   Important Message Given:  N/A - LOS <3 / Initial given by admissions     Corey Harold 12/04/2022, 12:39 PM

## 2022-12-04 NOTE — TOC Transition Note (Addendum)
Transition of Care [facility]) - CM/SW Discharge Note   Patient Details  Name: [name] MRN: [id number] Date of Birth: [birth year]  [facility]) CM/SW Contact:  Villa Herb, LCSWA Phone Number: 12/04/2022, 2:40 PM   Clinical Narrative:    CSW updated that pt is medically stable for D/C back to ALF today. CSW spoke to Ty with facility who states she reviewed D?C paperwork sent over by CSW and they are ready for pt to return. Ty requested that CSW set up [facility] for pt to address [address] [place], [place]. CSW spoke to [place] who states they will work on transport. [place] waiver to be added to pts chart. RN updated of plan for return to ALF. TOC signing off.   Addendum 4:20pm:  CSW updated that D/C will need to be placed on hold due to inability to complete medication reconciliation. TOC to follow for D/C tomorrow.   Final next level of care: Assisted Living Barriers to Discharge: Barriers Resolved   Patient Goals and CMS Choice CMS Medicare.gov Compare Post Acute Care list provided to:: Patient Choice offered to / list presented to : Patient  Discharge Placement                         Discharge Plan and Services Additional resources added to the After Visit Summary for   In-house Referral: Clinical Social Work Discharge Planning Services: CM Consult                                 Social Determinants of Health (SDOH) Interventions SDOH Screenings   Food Insecurity: No Food Insecurity (12/02/2022)  Housing: Low Risk  (12/02/2022)  Transportation Needs: No Transportation Needs (12/02/2022)  Utilities: Not At Risk (12/02/2022)  Social Connections: Unknown (07/21/2021)   Received [facility], [facility]  Stress: Stress Concern Present (05/10/2020)   Received [facility], [facility]  Tobacco Use: High Risk (12/01/2022)     Readmission Risk Interventions    12/03/2022   10:10 AM 03/01/2022   11:53 AM   Readmission Risk Prevention Plan  Transportation Screening Complete Complete  Home Care Screening Complete Complete  Medication Review (RN CM)  Complete

## 2022-12-04 NOTE — Discharge Summary (Signed)
Physician Discharge Summary   Patient: [name] MRN: [id number] DOB: February 01, 1951  Admit date:     12/01/2022  Discharge date: 12/04/22  Discharge Physician: Tyrone Nine   PCP: Frederich Chick., MD   Recommendations at discharge:  Follow up with GI and PCP after discharge, suggest recheck CMP in 1-2 weeks. Follow up pending serologies as discussed below.  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  The patient has had some associated nausea and vomiting for the same amount of time.  She denies any hematemesis.  She has had some loose stools, stating she had 2 loose stools on the day of admission.  She denies hematochezia or melena.  She is unclear whether she has been started on any new medications. She also complains of some shortness of breath for the last 2 to 3 days.  She continues to smoke 4 cigarettes/day.  She has smoked from 1/2 pack to 1 pack/day x 20 years.  She has nonproductive cough.  She denies any chest pain, hemoptysis.  She has had some subjective fevers and chills.  She denies any headache or neck pain. In the ED, the patient had a low-grade temperature 99.6 F.  She was hemodynamically stable.  Oxygen saturation was 86% room air.  She was placed on 2 L with saturation up to 95%.  CTA chest  was negative for PE.  There is no infiltrates or edema.  CT abdomen pelvis was negative for any acute findings.  She is status post cholecystectomy.  There is chronic biliary ductal dilatation with pancreatic calcifications.  There is no pancreatic stranding. WBC 13.1, hemoglobin 14.2, platelets 308.  Sodium 140, potassium 2.9, bicarbonate 30, serum creatinine 0.73.  AST 215, ALT 611, alk phosphatase 376, total bilirubin 1.2.  Viral hepatitis panel is negative.    The patient was recently admitted to [facility] from 09/30/2022 to 10/07/2022 to be initiated on intravenous antibiotics secondary to a positive intraoperative culture with corynebacterium striatum.  Notably, the patient had an MRI of the pelvis on 06/24/2022 which showed sacral osteomyelitis.  She has underwent excision of the sacral pressure ulcer and ostectomy with bilateral gluteus fasciocutaneous flap closure on 7/16 with plastic surgery at [facility].  She was discharged to skilled nursing facility on 09/24/2022 with p.o. cephalexin.  Subsequently, intraoperative cultures returned positive for corynebacterium striatum.  Because of this, the patient was advised to present for admission.  The patient was started on IV vancomycin and discharged back to her facility on 10/07/2022.  The plan was for intravenous vancomycin with a stop date of 11/12/2022.  She has continued to follow-up with wound care for sacral wound.  She is participating with PT ambulates with a walker.    Assessment and Plan: Elevated LFTs/abdominal pain: Fluctuating elevated transaminases and alk phos over the years with normalization in between.  On admission, AST 215, ALT  611, alk phos 376.  LFTs trending down, Today, AST 25, ALT 196, alk phos 227.  Additional serologies were ordered.  ANA, ASMA, AMA, ceruloplasmin all negative.  No iron overload. A1A, immunoglobulins in process. CT this admission with chronic diffuse biliary dilatation and evidence of chronic pancreatitis. Nothing  to suggest acute pancreatitis.  MRI/MRCP yesterday with chronic intrahepatic and CBD dilation, abrupt decreased caliber of the distal CBD at the ampulla without signs to indicate choledocholithiasis or obstruction, though exam limited by motion artifact. multiple small cystic lesions within the pancreas suspected to be IPMN's.   - Overall, LFT elevation in conjunction with right sided abdominal pain is concerning for biliary etiology. As LFTs are improving, GI recommends EUS outpatient for further evaluation, with her primary GI. Recommend low fat diet and continued trending of LFTs after discharge as well. Also due for screening colonoscopy.  - follow up pending serologies (as above) - continue creon 36,000u w/meals   Abnormal thyroid function studies: TSH 0.305, free T4 1.37. Has history of similar pattern years ago. No symptoms attributable to this at this time.  - Repeat TSH, T3, T4 in 4 weeks following resolution of this acute illness.   Pancreatic Cysts:  - MRI for suspected IPMNs in 2 years  Acute respiratory failure with hypoxia and hypercarbia due to AECOPD: Resolved.   -COVID PCR negative -Viral respiratory panel--neg -12/02/2022 CTA chest--negative PE, negative infiltrates, negative effusions   COPD exacerbation:  - Complete 5 days steroids w/prednisone - Continue breathing treatments (trelegy ellipta and prn albuterol) at home  Hypokalemia: resolved. Recheck in next week is recommended.    Sacral osteomyelitis -Finished 6 weeks of IV vancomycin 11/12/2022 -Sacral wound is clean without any skin breakdown   Cognitive impairment -Continue Aricept   Depression/anxiety with psychotic features: Quiescent currently. -Continue Abilify -Continue trazodone -Continue olanzapine   Essential hypertension: Continue home Tx   Opioid dependence -PDMP reviewed -Tramadol, #180, last refill 09/25/2022 -Oxycodone 5 mg, #14, last refill 11/12/2022 -[facility]   Diarrhea -check cdiff--no BM to  collect -check stool pathogen panel--no BM to collect  Moderate protein calorie malnutrition: Supplement protein in diet as able   Consultants: GI Procedures performed: None  Disposition: Assisted living Diet recommendation: Low fat diet DISCHARGE MEDICATION: Allergies as of 12/04/2022       Reactions   Tylenol [acetaminophen] Other (See Comments)   Inflammed Liver   Codeine Nausea And Vomiting   Nsaids Other (See Comments)   Stomach ulcers   Tolmetin Other (See Comments)   Stomach ulcers   Tramadol Other (See Comments)   GI UPSET        Medication List     TAKE these medications    acetaminophen 500 MG tablet Commonly known as: TYLENOL Take 2 tablets (1,000 mg total) by mouth every 6 (six) hours as needed for mild pain.   albuterol 108 (90 Base) MCG/ACT inhaler Commonly known as: VENTOLIN HFA Inhale 1-2 puffs into the lungs every 6 (six) hours as needed for wheezing or shortness of breath.   ARIPiprazole 5 MG tablet Commonly known as: ABILIFY Take 5 mg by mouth daily.   buprenorphine 2 MG Subl SL tablet Commonly known as: SUBUTEX Place 2 tablets (4 mg total) under the tongue every 6 (six) hours as needed (pain).   carvedilol 6.25 MG tablet Commonly known as: COREG Take 1 tablet (6.25 mg total) by mouth 2 (two) times daily with a meal.   DHEA 10 MG Caps Take 1 capsule by mouth daily.  diclofenac Sodium 1 % Gel Commonly known as: Voltaren Apply 2 g topically 4 (four) times daily.   donepezil 10 MG tablet Commonly known as: ARICEPT Take 10 mg by mouth daily.   doxepin 50 MG capsule Commonly known as: SINEQUAN Take 1 capsule (50 mg total) by mouth daily.   Ensure Original Liqd Take 1 Can by mouth in the morning, at noon, in the evening, and at bedtime.   GoodSense Ibuprofen 200 MG tablet Generic drug: ibuprofen Take 200 mg by mouth daily.   lipase/protease/amylase [address] UNITS Cpep capsule Commonly known as: CREON Take 1 capsule (36,000 Units  total) by mouth 3 (three) times daily before meals.   LORazepam 0.5 MG tablet Commonly known as: ATIVAN Take 1 tablet (0.5 mg total) by mouth 2 (two) times daily as needed for anxiety.   losartan 100 MG tablet Commonly known as: COZAAR Take 100 mg by mouth daily.   mirtazapine 7.5 MG tablet Commonly known as: REMERON Take 7.5 mg by mouth at bedtime.   naloxone 4 MG/0.1ML Liqd nasal spray kit Commonly known as: NARCAN Place 1 spray into the nose once.   nystatin 100000 UNIT/ML suspension Commonly known as: MYCOSTATIN Take 5 mLs (500,000 Units total) by mouth 4 (four) times daily for 14 days. Swish and spit   OLANZapine 10 MG tablet Commonly known as: ZyPREXA Take 1 tablet (10 mg total) by mouth at bedtime.   predniSONE 20 MG tablet Commonly known as: DELTASONE Take 2 tablets (40 mg total) by mouth daily with breakfast for 3 days.   strong iodine 5 % solution Take 1 mL by mouth 3 (three) times a week.   temazepam 22.5 MG capsule Commonly known as: RESTORIL Take 1 capsule (22.5 mg total) by mouth at bedtime.   traMADol 50 MG tablet Commonly known as: ULTRAM Take 1 tablet (50 mg total) by mouth every 4 (four) hours as needed for moderate pain.   Trelegy Ellipta 100-62.5-25 MCG/ACT Aepb Generic drug: Fluticasone-Umeclidin-Vilant Inhale 1 puff into the lungs daily.   VITAMIN A PO Take 1 capsule by mouth 3 (three) times a week.   Zinc Sulfate 220 (50 Zn) MG Tabs Take 0.5 tablets by mouth 3 (three) times a week.        Follow-up Information     Frederich Chick., MD. Schedule an appointment as soon as possible for a visit .   Specialty: Family Medicine Why: Recheck labs, Contact information: [address] [place] [address] [phone]         Lanelle Bal, DO. Schedule an appointment as soon as possible for a visit .   Specialty: Gastroenterology Why: Elevated LFTs Contact information: [address] [place] [address] [phone]                 Discharge Exam: Filed Weights   12/01/22 1334 12/02/22 1326  Weight: 40.8 kg 46.1 kg  BP 118/64 (BP Location: Right Arm)   Pulse 72   Temp 97.9 F (36.6 C) (Oral)   Resp 18   Ht 5\' 4"  (1.626 m)   Wt 46.1 kg   SpO2 98%   BMI 17.45 kg/m   Well-appearing 72yo F in no distress Has some intermittent right/upper abd pain that is stable. Mild tenderness in that area without rebound or guarding or distention. +BS.  Condition at discharge: stable  The results of significant diagnostics from this hospitalization (including imaging, microbiology, ancillary and laboratory) are listed below for reference.   Imaging  Studies: MR ABDOMEN WITH MRCP W CONTRAST  Result Date: 12/03/2022 CLINICAL DATA:  Evaluate common bile duct dilatation EXAM: MRI ABDOMEN WITH CONTRAST (WITH MRCP) TECHNIQUE: Multiplanar multisequence MR imaging of the abdomen was performed following the administration of intravenous contrast. Heavily T2-weighted images of the biliary and pancreatic ducts were obtained, and three-dimensional MRCP images were rendered by post processing. CONTRAST:  5mL GADAVIST GADOBUTROL 1 MMOL/ML IV SOLN COMPARISON:  12/01/2022 FINDINGS: Lower chest: Trace left pleural effusion. Hepatobiliary: No focal enhancing liver lesions. Cholecystectomy. Chronic intrahepatic and common bile duct dilatation is identified. The proximal CBD measures 1.1 cm in the distal common bile duct measures 1.1 cm. Abrupt decreased caliber of the distal common bile duct is noted at the and Biel. Although limited by mild motion artifact there are no signs to indicate choledocholithiasis. Pancreas: No signs of pancreatic inflammation. The main pancreatic duct is ectatic measuring up to 3 mm. Multiple uniformly T2 hyperintense cystic lesions are identified. The majority of these all measure less than 1 cm. The largest is in the tail of pancreas measuring 8 mm, image 21/14. Spleen:  Within normal limits in size and  appearance. Adrenals/Urinary Tract:  Normal adrenal glands. No hydronephrosis. Numerous kidney cysts are identified bilaterally. These are too numerous to count. The majority of these are uniformly T2 hyperintense compatible with benign Bosniak class 1 lesions. Cluster of subjacent cysts with intrinsic T1 hyperintensity are identified within the upper pole of the left kidney compatible with hemorrhagic cysts, image 37/20. Exophytic, uniformly T1 hyperintense cyst off the anterolateral cortex of the lower pole of right kidney measures 1.7 cm, image 66/2. This is also compatible with a hemorrhagic cyst. No follow-up imaging of the cysts recommended. Stomach/Bowel: Visualized portions within the abdomen are unremarkable. Vascular/Lymphatic: The upper abdominal vascularity appears patent. No abdominal adenopathy. Other:  No ascites. Musculoskeletal: Superior endplate compression fractures are identified involving the T11, T12, L1 and L3 vertebra. Up to 40% of the vertebral body height at these levels, most severe at L1. There is loss of there is associated endplate enhancement identified at these levels with mild paraspinal edema at T11 through L1. IMPRESSION: 1. Chronic intrahepatic and common bile duct dilatation is identified. Abrupt decreased caliber of the distal common bile duct is noted at the ampulla. Although limited by mild motion artifact there are no signs to indicate choledocholithiasis or obstructing mass. 2. Multiple uniformly T2 hyperintense cystic lesions are identified within the pancreas. The majority of these all measure less than 1 cm. The largest is in the tail of pancreas measuring 8 mm. These are likely side branch IPMNs. Recommend follow-up pancreas protocol MRI in 2 years. 3. Superior endplate compression fractures are identified involving the T11, T12, L1 and L3 vertebra. Up to 40% of the vertebral body height at these levels, most severe at L1. There is associated endplate enhancement and  mild paraspinal edema at T11 through L1. These are likely subacute. 4. Trace left pleural effusion. Electronically Signed   By: Signa Kell M.D.   On: 12/03/2022 05:06   CT ABDOMEN PELVIS W CONTRAST  Result Date: 12/01/2022 CLINICAL DATA:  Diffuse abdominal pain and anorexia for 3 days. Elevated liver function tests. EXAM: CT ABDOMEN AND PELVIS WITH CONTRAST TECHNIQUE: Multidetector CT imaging of the abdomen and pelvis was performed using the standard protocol following bolus administration of intravenous contrast. RADIATION DOSE REDUCTION: This exam was performed according to the departmental dose-optimization program which includes automated exposure control, adjustment of the mA and/or kV according  to patient size and/or use of iterative reconstruction technique. CONTRAST:  75 mL OMNIPAQUE IOHEXOL 350 MG/ML SOLN COMPARISON:  12/17/2020 FINDINGS: Lower Chest: No acute findings. Hepatobiliary: No suspicious hepatic masses identified. Prior cholecystectomy again noted. Diffuse biliary ductal dilatation remains unchanged. Pancreas: No evidence of pancreatic mass or acute pancreatitis. Scattered tiny pancreatic calcifications are noted, consistent with chronic pancreatitis. No evidence of peripancreatic fluid collections. Spleen: Within normal limits in size and appearance. Adrenals/Urinary Tract: Multiple small benign-appearing renal cysts are again seen bilaterally (No followup imaging is recommended). No suspicious masses identified. No evidence of ureteral calculi or hydronephrosis. Stomach/Bowel: No evidence of obstruction, inflammatory process or abnormal fluid collections. Diverticulosis is seen mainly involving the sigmoid colon, however there is no evidence of diverticulitis. Vascular/Lymphatic: No pathologically enlarged lymph nodes. No acute vascular findings. Reproductive: Prior hysterectomy noted. Adnexal regions are unremarkable in appearance. Other:  None. Musculoskeletal: No suspicious bone  lesions identified. Multiple lower thoracic and lumbar vertebral body compression fractures are seen, which are chronic in appearance IMPRESSION: No acute findings within the abdomen or pelvis. Prior cholecystectomy. Stable chronic diffuse biliary ductal dilatation. Chronic pancreatitis. Colonic diverticulosis, without radiographic evidence of diverticulitis. Electronically Signed   By: Danae Orleans M.D.   On: 12/01/2022 16:51   CT Angio Chest PE W and/or Wo Contrast  Result Date: 12/01/2022 CLINICAL DATA:  Chest pain and weakness shortness of breath. Probability for pulmonary embolism. EXAM: CT ANGIOGRAPHY CHEST WITH CONTRAST TECHNIQUE: Multidetector CT imaging of the chest was performed using the standard protocol during bolus administration of intravenous contrast. Multiplanar CT image reconstructions and MIPs were obtained to evaluate the vascular anatomy. RADIATION DOSE REDUCTION: This exam was performed according to the departmental dose-optimization program which includes automated exposure control, adjustment of the mA and/or kV according to patient size and/or use of iterative reconstruction technique. CONTRAST:  75 mL OMNIPAQUE IOHEXOL 350 MG/ML SOLN COMPARISON:  None Available. FINDINGS: Cardiovascular: Satisfactory opacification of pulmonary arteries noted, and no pulmonary emboli identified. No evidence of thoracic aortic dissection or aneurysm. Mediastinum/Nodes: No masses or pathologically enlarged lymph nodes identified. Lungs/Pleura: No pulmonary mass, infiltrate, or effusion. Upper abdomen: No acute findings. Musculoskeletal: No suspicious bone lesions identified. Review of the MIP images confirms the above findings. IMPRESSION: Negative. No evidence of pulmonary embolism or other acute findings. Electronically Signed   By: Danae Orleans M.D.   On: 12/01/2022 16:43    Microbiology: Results for orders placed or performed during the hospital encounter of 12/01/22  Resp panel by RT-PCR (RSV,  Flu A&B, Covid) Anterior Nasal Swab     Status: None   Collection Time: 12/01/22  1:27 PM   Specimen: Anterior Nasal Swab  Result Value Ref Range Status   SARS Coronavirus 2 by RT PCR NEGATIVE NEGATIVE Final    Comment: (NOTE) SARS-CoV-2 target nucleic acids are NOT DETECTED.  The SARS-CoV-2 RNA is generally detectable in upper respiratory specimens during the acute phase of infection. The lowest concentration of SARS-CoV-2 viral copies this assay can detect is 138 copies/mL. A negative result does not preclude SARS-Cov-2 infection and should not be used as the sole basis for treatment or other patient management decisions. A negative result may occur with  improper specimen collection/handling, submission of specimen other than nasopharyngeal swab, presence of viral mutation(s) within the areas targeted by this assay, and inadequate number of viral copies(<138 copies/mL). A negative result must be combined with clinical observations, patient history, and epidemiological information. The expected result is Negative.  Fact Sheet  for Patients:  BloggerCourse.com  Fact Sheet for Healthcare Providers:  SeriousBroker.it  This test is no t yet approved or cleared by the [place] FDA and  has been authorized for detection and/or diagnosis of SARS-CoV-2 by FDA under an Emergency Use Authorization (EUA). This EUA will remain  in effect (meaning this test can be used) for the duration of the COVID-19 declaration under Section 564(b)(1) of the Act, 21 U.S.C.section 360bbb-3(b)(1), unless the authorization is terminated  or revoked sooner.       Influenza A by PCR NEGATIVE NEGATIVE Final   Influenza B by PCR NEGATIVE NEGATIVE Final    Comment: (NOTE) The Xpert Xpress SARS-CoV-2/FLU/RSV plus assay is intended as an aid in the diagnosis of influenza from Nasopharyngeal swab specimens and should not be used as a sole basis for  treatment. Nasal washings and aspirates are unacceptable for Xpert Xpress SARS-CoV-2/FLU/RSV testing.  Fact Sheet for Patients: BloggerCourse.com  Fact Sheet for Healthcare Providers: SeriousBroker.it  This test is not yet approved or cleared by the [place] FDA and has been authorized for detection and/or diagnosis of SARS-CoV-2 by FDA under an Emergency Use Authorization (EUA). This EUA will remain in effect (meaning this test can be used) for the duration of the COVID-19 declaration under Section 564(b)(1) of the Act, 21 U.S.C. section 360bbb-3(b)(1), unless the authorization is terminated or revoked.     Resp Syncytial Virus by PCR NEGATIVE NEGATIVE Final    Comment: (NOTE) Fact Sheet for Patients: BloggerCourse.com  Fact Sheet for Healthcare Providers: SeriousBroker.it  This test is not yet approved or cleared by the [place] FDA and has been authorized for detection and/or diagnosis of SARS-CoV-2 by FDA under an Emergency Use Authorization (EUA). This EUA will remain in effect (meaning this test can be used) for the duration of the COVID-19 declaration under Section 564(b)(1) of the Act, 21 U.S.C. section 360bbb-3(b)(1), unless the authorization is terminated or revoked.  Performed at [facility], [address], [place], [place] [address]   Culture, blood (routine x 2)     Status: None (Preliminary result)   Collection Time: 12/02/22 10:26 AM   Specimen: BLOOD  Result Value Ref Range Status   Specimen Description BLOOD rt wrist  Final   Special Requests   Final    BOTTLES DRAWN AEROBIC AND ANAEROBIC Blood Culture results may not be optimal due to an excessive volume of blood received in culture bottles   Culture   Final    NO GROWTH 2 DAYS Performed at [facility], [address], [place], [place] [address]    Report Status PENDING  Incomplete  Culture,  blood (routine x 2)     Status: None (Preliminary result)   Collection Time: 12/02/22 10:34 AM   Specimen: BLOOD  Result Value Ref Range Status   Specimen Description BLOOD rt hand  Final   Special Requests   Final    BOTTLES DRAWN AEROBIC AND ANAEROBIC Blood Culture results may not be optimal due to an excessive volume of blood received in culture bottles   Culture   Final    NO GROWTH 2 DAYS Performed at [facility], [address], [place], [place] [address]    Report Status PENDING  Incomplete  Respiratory (~20 pathogens) panel by PCR     Status: None   Collection Time: 12/02/22  3:50 PM   Specimen: Nasopharyngeal Swab; Respiratory  Result Value Ref Range Status   Adenovirus NOT DETECTED NOT DETECTED Final   Coronavirus  229E NOT DETECTED NOT DETECTED Final    Comment: (NOTE) The Coronavirus on the Respiratory Panel, DOES NOT test for the novel  Coronavirus (2019 nCoV)    Coronavirus HKU1 NOT DETECTED NOT DETECTED Final   Coronavirus NL63 NOT DETECTED NOT DETECTED Final   Coronavirus OC43 NOT DETECTED NOT DETECTED Final   Metapneumovirus NOT DETECTED NOT DETECTED Final   Rhinovirus / Enterovirus NOT DETECTED NOT DETECTED Final   Influenza A NOT DETECTED NOT DETECTED Final   Influenza B NOT DETECTED NOT DETECTED Final   Parainfluenza Virus 1 NOT DETECTED NOT DETECTED Final   Parainfluenza Virus 2 NOT DETECTED NOT DETECTED Final   Parainfluenza Virus 3 NOT DETECTED NOT DETECTED Final   Parainfluenza Virus 4 NOT DETECTED NOT DETECTED Final   Respiratory Syncytial Virus NOT DETECTED NOT DETECTED Final   Bordetella pertussis NOT DETECTED NOT DETECTED Final   Bordetella Parapertussis NOT DETECTED NOT DETECTED Final   Chlamydophila pneumoniae NOT DETECTED NOT DETECTED Final   Mycoplasma pneumoniae NOT DETECTED NOT DETECTED Final    Comment: Performed at [facility], [address] [place], [place] [address]    Labs: CBC: Recent Labs  Lab 12/01/22 1353 12/02/22 1249  12/03/22 0410 12/04/22 0410  WBC 13.1* 9.2 6.6 12.6*  NEUTROABS 10.4* 7.3  --   --   HGB 14.2 12.7 12.3 12.2  HCT 45.4 39.8 38.9 38.5  MCV 93.8 92.6 92.0 92.1  PLT 308 298 310 320   Basic Metabolic Panel: Recent Labs  Lab 12/01/22 1353 12/02/22 1249 12/03/22 0410 12/04/22 0410  NA 140 143 139 141  K 2.9* 3.4* 4.2 4.0  CL 98 102 101 103  CO2 30 29 28 29   GLUCOSE 69* 70 208* 157*  BUN 29* 20 27* 25*  CREATININE 0.73 0.67 0.61 0.52  CALCIUM 9.4 9.1 8.9 9.3  MG  --  1.8 1.9 1.9   Liver Function Tests: Recent Labs  Lab 12/01/22 1353 12/02/22 1249 12/03/22 0410 12/04/22 0410  AST 215* 77* 41 25  ALT 611* 347* 259* 196*  ALKPHOS 376* 283* 253* 227*  BILITOT 1.2 1.0 0.4 0.2*  PROT 6.8 6.0* 6.0* 5.8*  ALBUMIN 3.5 3.1* 2.8* 2.9*   CBG: Recent Labs  Lab 12/01/22 1754 12/01/22 1920  GLUCAP 58* 77    Discharge time spent: greater than 30 minutes.  Signed: Tyrone Nine, MD [facility] 12/04/2022

## 2022-12-04 NOTE — Plan of Care (Signed)

## 2022-12-04 NOTE — [place] FL2 (Signed)
[place] MEDICAID FL2 LEVEL OF CARE FORM     IDENTIFICATION  Patient Name: [name] Birthdate: 12-18-1950 Sex: female Admission Date (Current Location): 12/01/2022  [facility] and [place] Number:  [employer] and Address:  [facility],  [address], [place] [address]      Provider Number: [id number]  Attending Physician Name and Address:  Tyrone Nine, MD  Relative Name and Phone Number:       Current Level of Care: Hospital Recommended Level of Care: [facility] Prior Approval Number:    Date Approved/Denied:   PASRR Number:    Discharge Plan: Domiciliary (Rest home)    Current Diagnoses: Patient Active Problem List   Diagnosis Date Noted   Malnutrition of moderate degree 12/04/2022   Hypokalemia 12/03/2022   Elevated LFTs 12/03/2022   Idiopathic chronic pancreatitis (HCC) 12/03/2022   Dilated cbd, acquired 12/03/2022   Opioid dependence (HCC) 12/02/2022   Transaminasemia 12/02/2022   Pressure ulcer of sacral region, stage 4 (HCC) 06/11/2022   RSV (acute bronchiolitis due to respiratory syncytial virus) 02/28/2022   Protein-calorie malnutrition, severe 08/03/2020   COPD with exacerbation (HCC) 08/02/2020   Acute respiratory failure with hypoxia (HCC) 08/01/2020   Severe recurrent major depression with psychotic features (HCC) 07/03/2020   Hallucinations    History of substance abuse (HCC) 05/10/2020   Psychosis, atypical (HCC) 05/09/2020   Shortness of breath 04/20/2020   Chronic diastolic CHF (congestive heart failure) (HCC) 04/20/2020   Nausea & vomiting 04/20/2020   Prolonged QT interval 04/20/2020   COPD with acute exacerbation (HCC) 06/20/2017   DOE (dyspnea on exertion) 06/19/2017   Cervical dystonia 11/27/2016   C. difficile colitis 09/10/2016   Abdominal pain 09/08/2016   Anemia, iron deficiency 05/23/2016   H/O cerebral venous sinus thrombosis associated with meningitis 05/23/2016   Third nerve palsy of left  eye 05/23/2016   Bacterial meningitis 05/15/2016   Gastrointestinal hemorrhage with melena 05/14/2016   History of narcotic use 05/14/2016   Acute encephalopathy    Fever    Left hemiparesis (HCC)    Generalized OA    Fibromyalgia    Tobacco abuse    Substance abuse (HCC)    Benign essential HTN    Tachycardia    Chronic pain syndrome    Acute blood loss anemia    Leukocytosis    Septic thrombophlebitis of sagittal sinus    Acute respiratory failure (HCC)    Streptococcus pneumoniae meningitis    Streptococcal bacteremia    Meningitis    History of stroke 04/12/2016   Stroke (cerebrum) (HCC) 04/12/2016   Neck pain 04/04/2016   Chronic abdominal pain 02/19/2016   Essential hypertension 02/19/2016   Bacterial conjunctivitis 02/19/2016   Health care maintenance 02/19/2016   Anxiety 05/08/2006   Cigarette smoker 05/08/2006   ASTHMA, UNSPECIFIED 05/08/2006   GERD (gastroesophageal reflux disease) 05/08/2006   CONSTIPATION 05/08/2006   CONVULSIONS, SEIZURES, NOS 05/08/2006    Orientation RESPIRATION BLADDER Height & Weight     Self, Situation, Time, Place  Normal Continent Weight: 101 lb 10.1 oz (46.1 kg) Height:  5\' 4"  (162.6 cm)  BEHAVIORAL SYMPTOMS/MOOD NEUROLOGICAL BOWEL NUTRITION STATUS      Continent Diet  AMBULATORY STATUS COMMUNICATION OF NEEDS Skin   Independent Verbally Normal                       Personal Care Assistance Level of Assistance  Bathing, Feeding, Dressing Bathing Assistance:  Independent Feeding assistance: Independent Dressing Assistance: Independent     Functional Limitations Info  Sight, Hearing, Speech Sight Info: Adequate Hearing Info: Adequate Speech Info: Adequate    SPECIAL CARE FACTORS FREQUENCY                       Contractures Contractures Info: Not present    Additional Factors Info  Code Status, Allergies Code Status Info: FULL Allergies Info: Tylenol (Acetaminophen), Codeine, Nsaids, Tolmetin, Tramadol            Current Medications (12/04/2022):  This is the current hospital active medication list Current Facility-Administered Medications  Medication Dose Route Frequency Provider Last Rate Last Admin   arformoterol (BROVANA) nebulizer solution 15 mcg  15 mcg Nebulization BID Tat, Onalee Hua, MD   15 mcg at 12/04/22 0813   ARIPiprazole (ABILIFY) tablet 5 mg  5 mg Oral Daily Tat, David, MD   5 mg at 12/04/22 1044   budesonide (PULMICORT) nebulizer solution 0.5 mg  0.5 mg Nebulization BID Tat, Onalee Hua, MD   0.5 mg at 12/04/22 0811   carvedilol (COREG) tablet 6.25 mg  6.25 mg Oral BID WC Tat, Onalee Hua, MD   6.25 mg at 12/04/22 0843   donepezil (ARICEPT) tablet 10 mg  10 mg Oral Maura Crandall, MD   10 mg at 12/03/22 2218   enoxaparin (LOVENOX) injection 40 mg  40 mg Subcutaneous Q24H Tat, Onalee Hua, MD   40 mg at 12/03/22 2219   feeding supplement (ENSURE ENLIVE / ENSURE PLUS) liquid 237 mL  237 mL Oral TID BM Tat, Onalee Hua, MD   237 mL at 12/04/22 1045   ipratropium-albuterol (DUONEB) 0.5-2.5 (3) MG/3ML nebulizer solution 3 mL  3 mL Nebulization TID Catarina Hartshorn, MD   3 mL at 12/04/22 0809   lipase/protease/amylase (CREON) capsule 36,000 Units  36,000 Units Oral TID AC Letta Median, PA-C   36,000 Units at 12/04/22 6045   methylPREDNISolone sodium succinate (SOLU-MEDROL) 125 mg/2 mL injection 60 mg  60 mg Intravenous Pablo Ledger, MD   60 mg at 12/04/22 0522   mirtazapine (REMERON) tablet 7.5 mg  7.5 mg Oral Maura Crandall, MD   7.5 mg at 12/03/22 2218   multivitamin with minerals tablet 1 tablet  1 tablet Oral Daily Tat, David, MD   1 tablet at 12/04/22 1044   OLANZapine (ZYPREXA) tablet 5 mg  5 mg Oral QHS Catarina Hartshorn, MD   5 mg at 12/03/22 2219   oxyCODONE (Oxy IR/ROXICODONE) immediate release tablet 2.5 mg  2.5 mg Oral Q6H PRN Tat, Onalee Hua, MD   2.5 mg at 12/04/22 4098   prochlorperazine (COMPAZINE) injection 10 mg  10 mg Intravenous Q6H PRN Tat, Onalee Hua, MD         Discharge Medications: acetaminophen  500 MG tablet Commonly known as: TYLENOL Take 2 tablets (1,000 mg total) by mouth every 6 (six) hours as needed for mild pain.    albuterol 108 (90 Base) MCG/ACT inhaler Commonly known as: VENTOLIN HFA Inhale 1-2 puffs into the lungs every 6 (six) hours as needed for wheezing or shortness of breath.    ARIPiprazole 5 MG tablet Commonly known as: ABILIFY Take 5 mg by mouth daily.    buprenorphine 2 MG Subl SL tablet Commonly known as: SUBUTEX Place 2 tablets (4 mg total) under the tongue every 6 (six) hours as needed (pain).    carvedilol 6.25 MG tablet Commonly known as: COREG Take 1 tablet (6.25 mg total) by  mouth 2 (two) times daily with a meal.    DHEA 10 MG Caps Take 1 capsule by mouth daily.    diclofenac Sodium 1 % Gel Commonly known as: Voltaren Apply 2 g topically 4 (four) times daily.    donepezil 10 MG tablet Commonly known as: ARICEPT Take 10 mg by mouth daily.    doxepin 50 MG capsule Commonly known as: SINEQUAN Take 1 capsule (50 mg total) by mouth daily.    Ensure Original Liqd Take 1 Can by mouth in the morning, at noon, in the evening, and at bedtime.    GoodSense Ibuprofen 200 MG tablet Generic drug: ibuprofen Take 200 mg by mouth daily.    lipase/protease/amylase 41324 UNITS Cpep capsule Commonly known as: CREON Take 1 capsule (36,000 Units total) by mouth 3 (three) times daily before meals.    LORazepam 0.5 MG tablet Commonly known as: ATIVAN Take 1 tablet (0.5 mg total) by mouth 2 (two) times daily as needed for anxiety.    losartan 100 MG tablet Commonly known as: COZAAR Take 100 mg by mouth daily.    mirtazapine 7.5 MG tablet Commonly known as: REMERON Take 7.5 mg by mouth at bedtime.    naloxone 4 MG/0.1ML Liqd nasal spray kit Commonly known as: NARCAN Place 1 spray into the nose once.    nystatin 100000 UNIT/ML suspension Commonly known as: MYCOSTATIN Take 5 mLs (500,000 Units total) by mouth 4 (four) times daily for 14 days.  Swish and spit    OLANZapine 10 MG tablet Commonly known as: ZyPREXA Take 1 tablet (10 mg total) by mouth at bedtime.    predniSONE 20 MG tablet Commonly known as: DELTASONE Take 2 tablets (40 mg total) by mouth daily with breakfast for 3 days.    strong iodine 5 % solution Take 1 mL by mouth 3 (three) times a week.    temazepam 22.5 MG capsule Commonly known as: RESTORIL Take 1 capsule (22.5 mg total) by mouth at bedtime.    traMADol 50 MG tablet Commonly known as: ULTRAM Take 1 tablet (50 mg total) by mouth every 4 (four) hours as needed for moderate pain.    Trelegy Ellipta 100-62.5-25 MCG/ACT Aepb Generic drug: Fluticasone-Umeclidin-Vilant Inhale 1 puff into the lungs daily.    VITAMIN A PO Take 1 capsule by mouth 3 (three) times a week.    Zinc Sulfate 220 (50 Zn) MG Tabs Take 0.5 tablets by mouth 3 (three) times a week.    Relevant Imaging Results:  Relevant Lab Results:   Additional Information SSN: [id number] [place]

## 2022-12-04 NOTE — Progress Notes (Signed)
Subjective: Patient reports abdominal pain, back pain and nausea this morning. She notes RLQ pain this morning. No BM so far this morning.   Objective: Vital signs in last 24 hours: Temp:  [97.6 F (36.4 C)-98 F (36.7 C)] 97.9 F (36.6 C) (09/25 0814) Pulse Rate:  [60-73] 72 (09/25 0814) Resp:  [12-18] 18 (09/25 0814) BP: (107-171)/(46-78) 118/64 (09/25 0814) SpO2:  [94 %-100 %] 98 % (09/25 0837) Last BM Date : 12/01/22 General:   Alert and oriented, pleasant Head:  Normocephalic and atraumatic. Eyes:  No icterus, sclera clear. Conjuctiva pink.  Mouth:  Without lesions, mucosa pink and moist.  Neck:  Supple, without thyromegaly or masses.  Heart:  S1, S2 present, no murmurs noted.  Lungs: Clear to auscultation bilaterally, without wheezing, rales, or rhonchi.  Abdomen:  Bowel sounds present, soft, non-distended. TTP of epigastrum and Right abdomen. No HSM or hernias noted. No rebound or guarding. No masses appreciated  Neurologic:  Alert and  oriented x4;  grossly normal neurologically. Skin:  Warm and dry, intact without significant lesions.  Psych:  Alert and cooperative. Normal mood and affect.  Intake/Output from previous day: 09/24 0701 - 09/25 0700 In: 720 [P.O.:720] Out: 500 [Urine:500] Intake/Output this shift: No intake/output data recorded.  Lab Results: Recent Labs    12/02/22 1249 12/03/22 0410 12/04/22 0410  WBC 9.2 6.6 12.6*  HGB 12.7 12.3 12.2  HCT 39.8 38.9 38.5  PLT 298 310 320   BMET Recent Labs    12/02/22 1249 12/03/22 0410 12/04/22 0410  NA 143 139 141  K 3.4* 4.2 4.0  CL 102 101 103  CO2 29 28 29   GLUCOSE 70 208* 157*  BUN 20 27* 25*  CREATININE 0.67 0.61 0.52  CALCIUM 9.1 8.9 9.3   LFT Recent Labs    12/02/22 1249 12/03/22 0410 12/04/22 0410  PROT 6.0* 6.0* 5.8*  ALBUMIN 3.1* 2.8* 2.9*  AST 77* 41 25  ALT 347* 259* 196*  ALKPHOS 283* 253* 227*  BILITOT 1.0 0.4 0.2*   PT/INR Recent Labs    12/02/22 1712 12/03/22 0410   LABPROT 14.5 14.2  INR 1.1 1.1   Hepatitis Panel Recent Labs    12/01/22 1901  HEPBSAG NON REACTIVE  HCVAB NON REACTIVE  HEPAIGM NON REACTIVE  HEPBIGM NON REACTIVE    Studies/Results: MR ABDOMEN WITH MRCP W CONTRAST  Result Date: 12/03/2022 CLINICAL DATA:  Evaluate common bile duct dilatation EXAM: MRI ABDOMEN WITH CONTRAST (WITH MRCP) TECHNIQUE: Multiplanar multisequence MR imaging of the abdomen was performed following the administration of intravenous contrast. Heavily T2-weighted images of the biliary and pancreatic ducts were obtained, and three-dimensional MRCP images were rendered by post processing. CONTRAST:  5mL GADAVIST GADOBUTROL 1 MMOL/ML IV SOLN COMPARISON:  12/01/2022 FINDINGS: Lower chest: Trace left pleural effusion. Hepatobiliary: No focal enhancing liver lesions. Cholecystectomy. Chronic intrahepatic and common bile duct dilatation is identified. The proximal CBD measures 1.1 cm in the distal common bile duct measures 1.1 cm. Abrupt decreased caliber of the distal common bile duct is noted at the and Biel. Although limited by mild motion artifact there are no signs to indicate choledocholithiasis. Pancreas: No signs of pancreatic inflammation. The main pancreatic duct is ectatic measuring up to 3 mm. Multiple uniformly T2 hyperintense cystic lesions are identified. The majority of these all measure less than 1 cm. The largest is in the tail of pancreas measuring 8 mm, image 21/14. Spleen:  Within normal limits in size and appearance. Adrenals/Urinary Tract:  Normal  adrenal glands. No hydronephrosis. Numerous kidney cysts are identified bilaterally. These are too numerous to count. The majority of these are uniformly T2 hyperintense compatible with benign Bosniak class 1 lesions. Cluster of subjacent cysts with intrinsic T1 hyperintensity are identified within the upper pole of the left kidney compatible with hemorrhagic cysts, image 37/20. Exophytic, uniformly T1 hyperintense  cyst off the anterolateral cortex of the lower pole of right kidney measures 1.7 cm, image 66/2. This is also compatible with a hemorrhagic cyst. No follow-up imaging of the cysts recommended. Stomach/Bowel: Visualized portions within the abdomen are unremarkable. Vascular/Lymphatic: The upper abdominal vascularity appears patent. No abdominal adenopathy. Other:  No ascites. Musculoskeletal: Superior endplate compression fractures are identified involving the T11, T12, L1 and L3 vertebra. Up to 40% of the vertebral body height at these levels, most severe at L1. There is loss of there is associated endplate enhancement identified at these levels with mild paraspinal edema at T11 through L1. IMPRESSION: 1. Chronic intrahepatic and common bile duct dilatation is identified. Abrupt decreased caliber of the distal common bile duct is noted at the ampulla. Although limited by mild motion artifact there are no signs to indicate choledocholithiasis or obstructing mass. 2. Multiple uniformly T2 hyperintense cystic lesions are identified within the pancreas. The majority of these all measure less than 1 cm. The largest is in the tail of pancreas measuring 8 mm. These are likely side branch IPMNs. Recommend follow-up pancreas protocol MRI in 2 years. 3. Superior endplate compression fractures are identified involving the T11, T12, L1 and L3 vertebra. Up to 40% of the vertebral body height at these levels, most severe at L1. There is associated endplate enhancement and mild paraspinal edema at T11 through L1. These are likely subacute. 4. Trace left pleural effusion. Electronically Signed   By: Signa Kell M.D.   On: 12/03/2022 05:06    Assessment: 72 year old with multiple medical issues, admitted with COPD exacerbation and found to have elevated LFTs and concern for acute on chronic pancreatitis.    Elevated LFTs/abdominal pain:  Fluctuating elevated transaminases and alk phos over the years with normalization in  between.  On admission, AST 215, ALT 611, alk phos 376.  LFTs trending down, Today, AST 25, ALT 196, alk phos 227.  Additional serologies were ordered.  ANA, ASMA, AMA, ceruloplasmin all negative.  No iron overload. A1A, immunoglobulins in process. CT this admission with chronic diffuse biliary dilatation and evidence of chronic pancreatitis. Nothing to suggest acute pancreatitis.  MRI/MRCP yesterday with chronic intrahepatic and CBD dilation, abrupt decreased caliber of the distal CBD at the ampulla without signs to indicate choledocholithiasis or obstruction, though exam limited by motion artifact. multiple small cystic lesions within the pancreas suspected to be IPMN's.   Overall, LFT elevation in conjunction with right sided abdominal pain is concerning for biliary etiology. As LFTs are improving, recommend continuing to monitor while inpatient. Will need EUS outpatient for further evaluation, with her primary GI. Recommend low fat diet and continued trending of LFTs.    Pancreatic Cysts:  Needs follow-up MRI in 2 years.    Overdue for surveillance colonoscopy:  To be done as outpatient with primary GI team.  Plan: Low fat diet Trend LFTs Outpatient EUS with primary GI  Outpatient Colonoscopy with primary GI  Follow for other pending serologies Creon 36k units with meals MRI for suspected IPMNs in 2 years   LOS: 2 days    12/04/2022, 9:07 AM   Lakyn Mantione L. Jeanmarie Hubert, MSN,  APRN, AGNP-C Adult-Gerontology Nurse Practitioner [facility]

## 2022-12-05 DIAGNOSIS — J9601 Acute respiratory failure with hypoxia: Secondary | ICD-10-CM | POA: Diagnosis not present

## 2022-12-05 LAB — CULTURE, BLOOD (ROUTINE X 2)

## 2022-12-05 LAB — T3, FREE: T3, Free: 1.9 pg/mL — ABNORMAL LOW (ref 2.0–4.4)

## 2022-12-05 MED ORDER — LORAZEPAM 0.5 MG PO TABS: 0.5000 mg | ORAL_TABLET | Freq: Two times a day (BID) | ORAL | 0 refills | Status: AC

## 2022-12-05 MED ORDER — BUPRENORPHINE HCL 8 MG SL SUBL: 4.0000 mg | SUBLINGUAL_TABLET | Freq: Four times a day (QID) | SUBLINGUAL | 0 refills | Status: AC

## 2022-12-05 NOTE — TOC Transition Note (Signed)
Transition of Care [facility]) - CM/SW Discharge Note   Patient Details  Name: [name] MRN: [id number] Date of Birth: Mar 11, 1951  Transition of [facility]) CM/SW Contact:  Villa Herb, LCSWA Phone Number: 12/05/2022, 11:07 AM   Clinical Narrative:    CSW updated that med list has been corrected. Updated Fl2 and D/C summary sent via email to Ty with [facility]. CSW updated RN and Diplomatic Services operational officer called for [employer]. TOC signing off.   Final next level of care: Assisted Living Barriers to Discharge: Barriers Resolved   Patient Goals and CMS Choice CMS Medicare.gov Compare Post Acute Care list provided to:: Patient Choice offered to / list presented to : Patient  Discharge Placement                         Discharge Plan and Services Additional resources added to the After Visit Summary for   In-house Referral: Clinical Social Work Discharge Planning Services: CM Consult                                 Social Determinants of Health (SDOH) Interventions SDOH Screenings   Food Insecurity: No Food Insecurity (12/02/2022)  Housing: Low Risk  (12/02/2022)  Transportation Needs: No Transportation Needs (12/02/2022)  Utilities: Not At Risk (12/02/2022)  Social Connections: Unknown (07/21/2021)   Received from [facility]  Stress: Stress Concern Present (05/10/2020)   Received from [facility]  Tobacco Use: High Risk (12/01/2022)     Readmission Risk Interventions    12/03/2022   10:10 AM 03/01/2022   11:53 AM  Readmission Risk Prevention Plan  Transportation Screening Complete Complete  Home Care Screening Complete Complete  Medication Review (RN CM)  Complete

## 2022-12-05 NOTE — [place] FL2 (Signed)
[place] MEDICAID FL2 LEVEL OF CARE FORM     IDENTIFICATION  Patient Name: [name] Birthdate: [birth year] Sex: female Admission Date (Current Location): 12/01/2022  [facility] and [place] Number:  [employer] and Address:  [facility],  [address] [place] [address]      Provider Number: [id number]  Attending Physician Name and Address:  Tyrone Nine, MD  Relative Name and Phone Number:       Current Level of Care: Hospital Recommended Level of Care: [facility] Prior Approval Number:    Date Approved/Denied:   PASRR Number:    Discharge Plan: Domiciliary (Rest home)    Current Diagnoses: Patient Active Problem List   Diagnosis Date Noted   Malnutrition of moderate degree 12/04/2022   Hypokalemia 12/03/2022   Elevated LFTs 12/03/2022   Idiopathic chronic pancreatitis (HCC) 12/03/2022   Dilated cbd, acquired 12/03/2022   Opioid dependence (HCC) 12/02/2022   Transaminasemia 12/02/2022   Pressure ulcer of sacral region, stage 4 (HCC) 06/11/2022   RSV (acute bronchiolitis due to respiratory syncytial virus) 02/28/2022   Protein-calorie malnutrition, severe 08/03/2020   COPD with exacerbation (HCC) 08/02/2020   Acute respiratory failure with hypoxia (HCC) 08/01/2020   Severe recurrent major depression with psychotic features (HCC) 07/03/2020   Hallucinations    History of substance abuse (HCC) 05/10/2020   Psychosis, atypical (HCC) 05/09/2020   Shortness of breath 04/20/2020   Chronic diastolic CHF (congestive heart failure) (HCC) 04/20/2020   Nausea & vomiting 04/20/2020   Prolonged QT interval 04/20/2020   COPD with acute exacerbation (HCC) 06/20/2017   DOE (dyspnea on exertion) 06/19/2017   Cervical dystonia 11/27/2016   C. difficile colitis 09/10/2016   Right flank pain 09/08/2016   Anemia, iron deficiency 05/23/2016   H/O cerebral venous sinus thrombosis associated with meningitis 05/23/2016   Third nerve palsy of  left eye 05/23/2016   Bacterial meningitis 05/15/2016   Gastrointestinal hemorrhage with melena 05/14/2016   History of narcotic use 05/14/2016   Acute encephalopathy    Fever    Left hemiparesis (HCC)    Generalized OA    Fibromyalgia    Tobacco abuse    Substance abuse (HCC)    Benign essential HTN    Tachycardia    Chronic pain syndrome    Acute blood loss anemia    Leukocytosis    Septic thrombophlebitis of sagittal sinus    Acute respiratory failure (HCC)    Streptococcus pneumoniae meningitis    Streptococcal bacteremia    Meningitis    History of stroke 04/12/2016   Stroke (cerebrum) (HCC) 04/12/2016   Neck pain 04/04/2016   Chronic abdominal pain 02/19/2016   Essential hypertension 02/19/2016   Bacterial conjunctivitis 02/19/2016   Health care maintenance 02/19/2016   Anxiety 05/08/2006   Cigarette smoker 05/08/2006   ASTHMA, UNSPECIFIED 05/08/2006   GERD (gastroesophageal reflux disease) 05/08/2006   CONSTIPATION 05/08/2006   CONVULSIONS, SEIZURES, NOS 05/08/2006    Orientation RESPIRATION BLADDER Height & Weight     Self, Situation, Time, Place  Normal Continent Weight: 101 lb 10.1 oz (46.1 kg) Height:  5\' 4"  (162.6 cm)  BEHAVIORAL SYMPTOMS/MOOD NEUROLOGICAL BOWEL NUTRITION STATUS      Continent Diet  AMBULATORY STATUS COMMUNICATION OF NEEDS Skin   Independent Verbally Normal                       Personal Care Assistance Level of Assistance  Bathing, Feeding, Dressing Bathing  Assistance: Independent Feeding assistance: Independent Dressing Assistance: Independent     Functional Limitations Info  Sight, Hearing, Speech Sight Info: Adequate Hearing Info: Adequate Speech Info: Adequate    SPECIAL CARE FACTORS FREQUENCY                       Contractures Contractures Info: Not present    Additional Factors Info  Code Status, Allergies Code Status Info: FULL Allergies Info: Tylenol (Acetaminophen), Codeine, Nsaids, Tolmetin,  Tramadol           Current Medications (12/05/2022):  This is the current hospital active medication list Current Facility-Administered Medications  Medication Dose Route Frequency Provider Last Rate Last Admin   arformoterol (BROVANA) nebulizer solution 15 mcg  15 mcg Nebulization BID Tat, Onalee Hua, MD   15 mcg at 12/05/22 0846   ARIPiprazole (ABILIFY) tablet 5 mg  5 mg Oral Daily Tat, David, MD   5 mg at 12/05/22 0818   budesonide (PULMICORT) nebulizer solution 0.5 mg  0.5 mg Nebulization BID Tat, Onalee Hua, MD   0.5 mg at 12/05/22 0846   carvedilol (COREG) tablet 6.25 mg  6.25 mg Oral BID WC Tat, Onalee Hua, MD   6.25 mg at 12/05/22 0818   donepezil (ARICEPT) tablet 10 mg  10 mg Oral Maura Crandall, MD   10 mg at 12/04/22 2155   enoxaparin (LOVENOX) injection 40 mg  40 mg Subcutaneous Q24H Tat, Onalee Hua, MD   40 mg at 12/04/22 2154   feeding supplement (ENSURE ENLIVE / ENSURE PLUS) liquid 237 mL  237 mL Oral TID BM Tat, Onalee Hua, MD   237 mL at 12/04/22 1320   ipratropium-albuterol (DUONEB) 0.5-2.5 (3) MG/3ML nebulizer solution 3 mL  3 mL Nebulization TID Catarina Hartshorn, MD   3 mL at 12/05/22 0846   lipase/protease/amylase (CREON) capsule 36,000 Units  36,000 Units Oral TID AC Letta Median, PA-C   36,000 Units at 12/05/22 0818   methylPREDNISolone sodium succinate (SOLU-MEDROL) 125 mg/2 mL injection 60 mg  60 mg Intravenous Pablo Ledger, MD   60 mg at 12/04/22 1505   mirtazapine (REMERON) tablet 7.5 mg  7.5 mg Oral Maura Crandall, MD   7.5 mg at 12/04/22 2154   multivitamin with minerals tablet 1 tablet  1 tablet Oral Daily Tat, Onalee Hua, MD   1 tablet at 12/05/22 0818   OLANZapine (ZYPREXA) tablet 5 mg  5 mg Oral Maura Crandall, MD   5 mg at 12/04/22 2154   oxyCODONE (Oxy IR/ROXICODONE) immediate release tablet 2.5 mg  2.5 mg Oral Q6H PRN Catarina Hartshorn, MD   2.5 mg at 12/04/22 2154   prochlorperazine (COMPAZINE) injection 10 mg  10 mg Intravenous Q6H PRN Catarina Hartshorn, MD         Discharge  Medications: Allergies as of 12/05/2022       Reactions   Nsaids Other (See Comments), Nausea And Vomiting   Stomach ulcers   Tylenol [acetaminophen] Other (See Comments)   Inflammed Liver   Codeine Nausea And Vomiting   Tolmetin Other (See Comments)   Stomach ulcers   Tramadol Other (See Comments)   GI UPSET Per ALF        Medication List     STOP taking these medications    ARIPiprazole 5 MG tablet Commonly known as: ABILIFY   [place] 6.5 MG Inst Generic drug: Prasterone   OxyCODONE HCl (Abuse Deter) 5 MG Taba Commonly known as: OXAYDO   temazepam 22.5 MG capsule Commonly  known as: RESTORIL   traMADol 50 MG tablet Commonly known as: ULTRAM       TAKE these medications    acetaminophen 500 MG tablet Commonly known as: TYLENOL Take 2 tablets (1,000 mg total) by mouth every 6 (six) hours as needed for mild pain.   albuterol 108 (90 Base) MCG/ACT inhaler Commonly known as: VENTOLIN HFA Inhale 1-2 puffs into the lungs every 6 (six) hours as needed for wheezing or shortness of breath.   ascorbic acid 500 MG tablet Commonly known as: VITAMIN C Take 500 mg by mouth daily.   buprenorphine 8 MG Subl SL tablet Commonly known as: SUBUTEX Place 0.5 tablets (4 mg total) under the tongue every 6 (six) hours for 3 days. What changed:  how much to take when to take this reasons to take this   carvedilol 6.25 MG tablet Commonly known as: COREG Take 1 tablet (6.25 mg total) by mouth 2 (two) times daily with a meal.   Cholecalciferol 50 MCG (2000 UT) Tabs Take 2,000 Units by mouth in the morning, at noon, in the evening, and at bedtime.   donepezil 10 MG tablet Commonly known as: ARICEPT Take 10 mg by mouth daily.   doxepin 50 MG capsule Commonly known as: SINEQUAN Take 1 capsule (50 mg total) by mouth daily.   Eliquis 5 MG Tabs tablet Generic drug: apixaban Take 5 mg by mouth 2 (two) times daily.   famotidine 20 MG tablet Commonly known as:  PEPCID Take 20 mg by mouth daily.   Fish Oil 1000 MG Caps Take 1 capsule by mouth in the morning, at noon, and at bedtime.   fluticasone 50 MCG/ACT nasal spray Commonly known as: FLONASE Place 1 spray into both nostrils daily.   ipratropium 0.03 % nasal spray Commonly known as: ATROVENT Place 1 spray into both nostrils 2 (two) times daily.   levocetirizine 5 MG tablet Commonly known as: XYZAL Take 5 mg by mouth daily.   lipase/protease/amylase 16109 UNITS Cpep capsule Commonly known as: CREON Take 1 capsule (36,000 Units total) by mouth 3 (three) times daily before meals.   LORazepam 0.5 MG tablet Commonly known as: ATIVAN Take 1 tablet (0.5 mg total) by mouth 2 (two) times daily for 3 days. What changed:  when to take this reasons to take this   losartan 100 MG tablet Commonly known as: COZAAR Take 100 mg by mouth daily.   Magnesium Citrate 100 MG Caps Take 1 capsule by mouth in the morning, at noon, and at bedtime.   magnesium oxide 400 (240 Mg) MG tablet Commonly known as: MAG-OX Take 1 tablet by mouth daily.   mirtazapine 7.5 MG tablet Commonly known as: REMERON Take 7.5 mg by mouth at bedtime.   naloxone 4 MG/0.1ML Liqd nasal spray kit Commonly known as: NARCAN Place 1 spray into the nose once.   nystatin 100000 UNIT/ML suspension Commonly known as: MYCOSTATIN Take 5 mLs (500,000 Units total) by mouth 4 (four) times daily for 14 days. Swish and spit   OLANZapine 10 MG tablet Commonly known as: ZyPREXA Take 1 tablet (10 mg total) by mouth at bedtime.   predniSONE 20 MG tablet Commonly known as: DELTASONE Take 2 tablets (40 mg total) by mouth daily with breakfast for 3 days.   Thera-Tabs M Tabs Take 1 tablet by mouth daily.   traZODone 50 MG tablet Commonly known as: DESYREL Take 50 mg by mouth at bedtime as needed for sleep.   Trelegy Ellipta 100-62.5-25 MCG/ACT  Aepb Generic drug: Fluticasone-Umeclidin-Vilant Inhale 1 puff into the lungs  daily.   Zinc Sulfate 220 (50 Zn) MG Tabs Take 0.5 tablets by mouth 3 (three) times a week.         Relevant Imaging Results:  Relevant Lab Results:   Additional Information SSN: [id number], [place]

## 2022-12-05 NOTE — Discharge Summary (Signed)
Physician Discharge Summary   Patient: GWENEVERE PRIOR MRN: [id number] DOB: 1950/05/03  Admit date:     12/01/2022  Discharge date: 12/05/22  Discharge Physician: Tyrone Nine   PCP: Frederich Chick., MD   Recommendations at discharge:  Follow up with GI and PCP after discharge, suggest recheck CMP in 1-2 weeks. Follow up pending serologies as discussed below. Also consider imaging as suggested below. Suggest medication changes to reduce polypharmacy. Did not reorder tramadol since she's on buprenorphine, and has listed intolerance to it, nor temazepam as she's already on multiple other sleep agents and lorazepam BID.   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  The patient has had some associated nausea and vomiting for the same amount of time.  She denies any hematemesis.  She has had some loose stools, stating she had 2 loose stools on the day of admission.  She denies hematochezia or melena.  She is unclear whether she has been started on any new medications. She also complains of some shortness of breath for the last 2 to 3 days.  She continues to smoke 4 cigarettes/day.  She has smoked from 1/2 pack to 1 pack/day x 20 years.  She has nonproductive cough.  She denies any chest pain,  hemoptysis.  She has had some subjective fevers and chills.  She denies any headache or neck pain. In the ED, the patient had a low-grade temperature 99.6 F.  She was hemodynamically stable.  Oxygen saturation was 86% room air.  She was placed on 2 L with saturation up to 95%.  CTA chest was negative for PE.  There is no infiltrates or edema.  CT abdomen pelvis was negative for any acute findings.  She is status post cholecystectomy.  There is chronic biliary ductal dilatation with pancreatic calcifications.  There is no pancreatic stranding. WBC 13.1, hemoglobin 14.2, platelets 308.  Sodium 140, potassium 2.9, bicarbonate 30, serum creatinine 0.73.  AST 215, ALT 611, alk phosphatase 376, total bilirubin 1.2.  Viral hepatitis panel is negative.    The patient was recently admitted to [facility] from 09/30/2022 to 10/07/2022 to be initiated on intravenous antibiotics secondary to a positive intraoperative culture with corynebacterium striatum.  Notably, the patient had an MRI of the pelvis on 06/24/2022 which showed sacral osteomyelitis.  She has underwent excision of the sacral pressure ulcer and ostectomy with bilateral gluteus fasciocutaneous flap closure on 7/16 with plastic surgery at [facility].  She was discharged to skilled nursing facility on 09/24/2022 with p.o. cephalexin.  Subsequently, intraoperative cultures returned positive for corynebacterium striatum.  Because of this, the patient was advised to present for admission.  The patient was started on IV vancomycin and discharged back to her facility on 10/07/2022.  The plan was for intravenous vancomycin with a stop date of 11/12/2022.  She has continued to  follow-up with wound care for sacral wound.  She is participating with PT ambulates with a walker.    Assessment and Plan: Elevated LFTs/abdominal pain: Fluctuating elevated transaminases and alk phos over the years with normalization in between.  On admission, AST 215, ALT 611, alk phos 376.  LFTs  trending down, Today, AST 25, ALT 196, alk phos 227.  Additional serologies were ordered.  ANA, ASMA, AMA, ceruloplasmin all negative.  No iron overload. A1A, immunoglobulins in process. CT this admission with chronic diffuse biliary dilatation and evidence of chronic pancreatitis. Nothing to suggest acute pancreatitis.  MRI/MRCP yesterday with chronic intrahepatic and CBD dilation, abrupt decreased caliber of the distal CBD at the ampulla without signs to indicate choledocholithiasis or obstruction, though exam limited by motion artifact. multiple small cystic lesions within the pancreas suspected to be IPMN's.   - Overall, LFT elevation in conjunction with right sided abdominal pain is concerning for biliary etiology. As LFTs are improving, GI recommends EUS outpatient for further evaluation, with her primary GI. Recommend low fat diet and continued trending of LFTs after discharge as well. Also due for screening colonoscopy.  - follow up pending serologies (as above) - continue creon 36,000u w/meals   Abnormal thyroid function studies: TSH 0.305, free T4 1.37. Has history of similar pattern years ago. No symptoms attributable to this at this time.  - Repeat TSH, T3, T4 in 4 weeks following resolution of this acute illness.   Pancreatic Cysts:  - MRI for suspected IPMNs in 2 years  Acute respiratory failure with hypoxia and hypercarbia due to AECOPD: Resolved.   -COVID PCR negative -Viral respiratory panel--neg -12/02/2022 CTA chest--negative PE, negative infiltrates, negative effusions   COPD exacerbation:  - Complete 5 days steroids w/prednisone - Continue breathing treatments (trelegy ellipta and prn albuterol) at home  Hypokalemia: resolved. Recheck in next week is recommended.    Sacral osteomyelitis -Finished 6 weeks of IV vancomycin 11/12/2022 -Sacral wound is clean without any skin breakdown   Cognitive impairment -Continue Aricept   Depression/anxiety with psychotic features:  Quiescent currently. -Continue Abilify -Continue trazodone -Continue olanzapine   Essential hypertension: Continue home Tx   Opioid dependence: Reordered buprenorphine per [facility] that was sent to [place] from her facility. PDMP was reviewed showing Tramadol, #180, last refill 09/25/2022 and Oxycodone 5 mg, #14, last refill 11/12/2022.    Diarrhea: resolved since admission  Moderate protein calorie malnutrition: Supplement protein in diet as able   Consultants: GI Procedures performed: None  Disposition: Assisted living Diet recommendation: Low fat diet DISCHARGE MEDICATION: Allergies as of 12/05/2022       Reactions   Nsaids Other (See Comments), Nausea And Vomiting   Stomach ulcers   Tylenol [acetaminophen] Other (See Comments)   Inflammed Liver   Codeine Nausea And Vomiting   Tolmetin Other (See Comments)   Stomach ulcers   Tramadol Other (See Comments)   GI UPSET Per ALF        Medication List     STOP taking these medications    ARIPiprazole 5 MG tablet Commonly known as: ABILIFY   Intrarosa 6.5 MG Inst Generic drug: Prasterone   OxyCODONE HCl (Abuse Deter) 5 MG Taba Commonly known as: OXAYDO   temazepam 22.5 MG capsule Commonly known as: RESTORIL   traMADol 50 MG tablet Commonly known as: ULTRAM       TAKE these medications    acetaminophen 500 MG tablet Commonly known as: TYLENOL Take 2 tablets (1,000 mg total) by mouth every 6 (  six) hours as needed for mild pain.   albuterol 108 (90 Base) MCG/ACT inhaler Commonly known as: VENTOLIN HFA Inhale 1-2 puffs into the lungs every 6 (six) hours as needed for wheezing or shortness of breath.   ascorbic acid 500 MG tablet Commonly known as: VITAMIN C Take 500 mg by mouth daily.   buprenorphine 8 MG Subl SL tablet Commonly known as: SUBUTEX Place 0.5 tablets (4 mg total) under the tongue every 6 (six) hours for 3 days. What changed:  how much to take when to take this reasons to take this   carvedilol  6.25 MG tablet Commonly known as: COREG Take 1 tablet (6.25 mg total) by mouth 2 (two) times daily with a meal.   Cholecalciferol 50 MCG (2000 UT) Tabs Take 2,000 Units by mouth in the morning, at noon, in the evening, and at bedtime.   donepezil 10 MG tablet Commonly known as: ARICEPT Take 10 mg by mouth daily.   doxepin 50 MG capsule Commonly known as: SINEQUAN Take 1 capsule (50 mg total) by mouth daily.   Eliquis 5 MG Tabs tablet Generic drug: apixaban Take 5 mg by mouth 2 (two) times daily.   famotidine 20 MG tablet Commonly known as: PEPCID Take 20 mg by mouth daily.   Fish Oil 1000 MG Caps Take 1 capsule by mouth in the morning, at noon, and at bedtime.   fluticasone 50 MCG/ACT nasal spray Commonly known as: FLONASE Place 1 spray into both nostrils daily.   ipratropium 0.03 % nasal spray Commonly known as: ATROVENT Place 1 spray into both nostrils 2 (two) times daily.   levocetirizine 5 MG tablet Commonly known as: XYZAL Take 5 mg by mouth daily.   lipase/protease/amylase 78295 UNITS Cpep capsule Commonly known as: CREON Take 1 capsule (36,000 Units total) by mouth 3 (three) times daily before meals.   LORazepam 0.5 MG tablet Commonly known as: ATIVAN Take 1 tablet (0.5 mg total) by mouth 2 (two) times daily for 3 days. What changed:  when to take this reasons to take this   losartan 100 MG tablet Commonly known as: COZAAR Take 100 mg by mouth daily.   Magnesium Citrate 100 MG Caps Take 1 capsule by mouth in the morning, at noon, and at bedtime.   magnesium oxide 400 (240 Mg) MG tablet Commonly known as: MAG-OX Take 1 tablet by mouth daily.   mirtazapine 7.5 MG tablet Commonly known as: REMERON Take 7.5 mg by mouth at bedtime.   naloxone 4 MG/0.1ML Liqd nasal spray kit Commonly known as: NARCAN Place 1 spray into the nose once.   nystatin 100000 UNIT/ML suspension Commonly known as: MYCOSTATIN Take 5 mLs (500,000 Units total) by mouth 4  (four) times daily for 14 days. Swish and spit   OLANZapine 10 MG tablet Commonly known as: ZyPREXA Take 1 tablet (10 mg total) by mouth at bedtime.   predniSONE 20 MG tablet Commonly known as: DELTASONE Take 2 tablets (40 mg total) by mouth daily with breakfast for 3 days.   Thera-Tabs M Tabs Take 1 tablet by mouth daily.   traZODone 50 MG tablet Commonly known as: DESYREL Take 50 mg by mouth at bedtime as needed for sleep.   Trelegy Ellipta 100-62.5-25 MCG/ACT Aepb Generic drug: Fluticasone-Umeclidin-Vilant Inhale 1 puff into the lungs daily.   Zinc Sulfate 220 (50 Zn) MG Tabs Take 0.5 tablets by mouth 3 (three) times a week.        Follow-up Information  Frederich Chick., MD. Schedule an appointment as soon as possible for a visit .   Specialty: Family Medicine Why: Recheck labs, Contact information: [address] [place] [address] [phone]         Lanelle Bal, DO. Schedule an appointment as soon as possible for a visit .   Specialty: Gastroenterology Why: Elevated LFTs Contact information: [address] [place] [address] [phone]                Discharge Exam: Filed Weights   12/01/22 1334 12/02/22 1326  Weight: 40.8 kg 46.1 kg  BP (!) 154/67 (BP Location: Right Arm)   Pulse 64   Temp (!) 97.5 F (36.4 C) (Oral)   Resp 16   Ht 5\' 4"  (1.626 m)   Wt 46.1 kg   SpO2 94%   BMI 17.45 kg/m   Well-appearing 72yo F in no distress Has some intermittent right/upper abd pain that is stable. Mild tenderness in that area without rebound or guarding or distention. +BS.  Condition at discharge: stable  The results of significant diagnostics from this hospitalization (including imaging, microbiology, ancillary and laboratory) are listed below for reference.   Imaging Studies: MR ABDOMEN WITH MRCP W CONTRAST  Result Date: 12/03/2022 CLINICAL DATA:  Evaluate common bile duct dilatation EXAM: MRI ABDOMEN WITH CONTRAST (WITH  MRCP) TECHNIQUE: Multiplanar multisequence MR imaging of the abdomen was performed following the administration of intravenous contrast. Heavily T2-weighted images of the biliary and pancreatic ducts were obtained, and three-dimensional MRCP images were rendered by post processing. CONTRAST:  5mL GADAVIST GADOBUTROL 1 MMOL/ML IV SOLN COMPARISON:  12/01/2022 FINDINGS: Lower chest: Trace left pleural effusion. Hepatobiliary: No focal enhancing liver lesions. Cholecystectomy. Chronic intrahepatic and common bile duct dilatation is identified. The proximal CBD measures 1.1 cm in the distal common bile duct measures 1.1 cm. Abrupt decreased caliber of the distal common bile duct is noted at the and Biel. Although limited by mild motion artifact there are no signs to indicate choledocholithiasis. Pancreas: No signs of pancreatic inflammation. The main pancreatic duct is ectatic measuring up to 3 mm. Multiple uniformly T2 hyperintense cystic lesions are identified. The majority of these all measure less than 1 cm. The largest is in the tail of pancreas measuring 8 mm, image 21/14. Spleen:  Within normal limits in size and appearance. Adrenals/Urinary Tract:  Normal adrenal glands. No hydronephrosis. Numerous kidney cysts are identified bilaterally. These are too numerous to count. The majority of these are uniformly T2 hyperintense compatible with benign Bosniak class 1 lesions. Cluster of subjacent cysts with intrinsic T1 hyperintensity are identified within the upper pole of the left kidney compatible with hemorrhagic cysts, image 37/20. Exophytic, uniformly T1 hyperintense cyst off the anterolateral cortex of the lower pole of right kidney measures 1.7 cm, image 66/2. This is also compatible with a hemorrhagic cyst. No follow-up imaging of the cysts recommended. Stomach/Bowel: Visualized portions within the abdomen are unremarkable. Vascular/Lymphatic: The upper abdominal vascularity appears patent. No abdominal  adenopathy. Other:  No ascites. Musculoskeletal: Superior endplate compression fractures are identified involving the T11, T12, L1 and L3 vertebra. Up to 40% of the vertebral body height at these levels, most severe at L1. There is loss of there is associated endplate enhancement identified at these levels with mild paraspinal edema at T11 through L1. IMPRESSION: 1. Chronic intrahepatic and common bile duct dilatation is identified. Abrupt decreased caliber of the distal common bile duct is noted at the ampulla. Although  limited by mild motion artifact there are no signs to indicate choledocholithiasis or obstructing mass. 2. Multiple uniformly T2 hyperintense cystic lesions are identified within the pancreas. The majority of these all measure less than 1 cm. The largest is in the tail of pancreas measuring 8 mm. These are likely side branch IPMNs. Recommend follow-up pancreas protocol MRI in 2 years. 3. Superior endplate compression fractures are identified involving the T11, T12, L1 and L3 vertebra. Up to 40% of the vertebral body height at these levels, most severe at L1. There is associated endplate enhancement and mild paraspinal edema at T11 through L1. These are likely subacute. 4. Trace left pleural effusion. Electronically Signed   By: Signa Kell M.D.   On: 12/03/2022 05:06   CT ABDOMEN PELVIS W CONTRAST  Result Date: 12/01/2022 CLINICAL DATA:  Diffuse abdominal pain and anorexia for 3 days. Elevated liver function tests. EXAM: CT ABDOMEN AND PELVIS WITH CONTRAST TECHNIQUE: Multidetector CT imaging of the abdomen and pelvis was performed using the standard protocol following bolus administration of intravenous contrast. RADIATION DOSE REDUCTION: This exam was performed according to the departmental dose-optimization program which includes automated exposure control, adjustment of the mA and/or kV according to patient size and/or use of iterative reconstruction technique. CONTRAST:  75 mL OMNIPAQUE  IOHEXOL 350 MG/ML SOLN COMPARISON:  12/17/2020 FINDINGS: Lower Chest: No acute findings. Hepatobiliary: No suspicious hepatic masses identified. Prior cholecystectomy again noted. Diffuse biliary ductal dilatation remains unchanged. Pancreas: No evidence of pancreatic mass or acute pancreatitis. Scattered tiny pancreatic calcifications are noted, consistent with chronic pancreatitis. No evidence of peripancreatic fluid collections. Spleen: Within normal limits in size and appearance. Adrenals/Urinary Tract: Multiple small benign-appearing renal cysts are again seen bilaterally (No followup imaging is recommended). No suspicious masses identified. No evidence of ureteral calculi or hydronephrosis. Stomach/Bowel: No evidence of obstruction, inflammatory process or abnormal fluid collections. Diverticulosis is seen mainly involving the sigmoid colon, however there is no evidence of diverticulitis. Vascular/Lymphatic: No pathologically enlarged lymph nodes. No acute vascular findings. Reproductive: Prior hysterectomy noted. Adnexal regions are unremarkable in appearance. Other:  None. Musculoskeletal: No suspicious bone lesions identified. Multiple lower thoracic and lumbar vertebral body compression fractures are seen, which are chronic in appearance IMPRESSION: No acute findings within the abdomen or pelvis. Prior cholecystectomy. Stable chronic diffuse biliary ductal dilatation. Chronic pancreatitis. Colonic diverticulosis, without radiographic evidence of diverticulitis. Electronically Signed   By: Danae Orleans M.D.   On: 12/01/2022 16:51   CT Angio Chest PE W and/or Wo Contrast  Result Date: 12/01/2022 CLINICAL DATA:  Chest pain and weakness shortness of breath. Probability for pulmonary embolism. EXAM: CT ANGIOGRAPHY CHEST WITH CONTRAST TECHNIQUE: Multidetector CT imaging of the chest was performed using the standard protocol during bolus administration of intravenous contrast. Multiplanar CT image  reconstructions and MIPs were obtained to evaluate the vascular anatomy. RADIATION DOSE REDUCTION: This exam was performed according to the departmental dose-optimization program which includes automated exposure control, adjustment of the mA and/or kV according to patient size and/or use of iterative reconstruction technique. CONTRAST:  75 mL OMNIPAQUE IOHEXOL 350 MG/ML SOLN COMPARISON:  None Available. FINDINGS: Cardiovascular: Satisfactory opacification of pulmonary arteries noted, and no pulmonary emboli identified. No evidence of thoracic aortic dissection or aneurysm. Mediastinum/Nodes: No masses or pathologically enlarged lymph nodes identified. Lungs/Pleura: No pulmonary mass, infiltrate, or effusion. Upper abdomen: No acute findings. Musculoskeletal: No suspicious bone lesions identified. Review of the MIP images confirms the above findings. IMPRESSION: Negative. No evidence of pulmonary embolism  or other acute findings. Electronically Signed   By: Danae Orleans M.D.   On: 12/01/2022 16:43    Microbiology: Results for orders placed or performed during the hospital encounter of 12/01/22  Resp panel by RT-PCR (RSV, Flu A&B, Covid) Anterior Nasal Swab     Status: None   Collection Time: 12/01/22  1:27 PM   Specimen: Anterior Nasal Swab  Result Value Ref Range Status   SARS Coronavirus 2 by RT PCR NEGATIVE NEGATIVE Final    Comment: (NOTE) SARS-CoV-2 target nucleic acids are NOT DETECTED.  The SARS-CoV-2 RNA is generally detectable in upper respiratory specimens during the acute phase of infection. The lowest concentration of SARS-CoV-2 viral copies this assay can detect is 138 copies/mL. A negative result does not preclude SARS-Cov-2 infection and should not be used as the sole basis for treatment or other patient management decisions. A negative result may occur with  improper specimen collection/handling, submission of specimen other than nasopharyngeal swab, presence of viral mutation(s)  within the areas targeted by this assay, and inadequate number of viral copies(<138 copies/mL). A negative result must be combined with clinical observations, patient history, and epidemiological information. The expected result is Negative.  Fact Sheet for Patients:  BloggerCourse.com  Fact Sheet for Healthcare Providers:  SeriousBroker.it  This test is no t yet approved or cleared by the [place] FDA and  has been authorized for detection and/or diagnosis of SARS-CoV-2 by FDA under an Emergency Use Authorization (EUA). This EUA will remain  in effect (meaning this test can be used) for the duration of the COVID-19 declaration under Section 564(b)(1) of the Act, 21 U.S.C.section 360bbb-3(b)(1), unless the authorization is terminated  or revoked sooner.       Influenza A by PCR NEGATIVE NEGATIVE Final   Influenza B by PCR NEGATIVE NEGATIVE Final    Comment: (NOTE) The Xpert Xpress SARS-CoV-2/FLU/RSV plus assay is intended as an aid in the diagnosis of influenza from Nasopharyngeal swab specimens and should not be used as a sole basis for treatment. Nasal washings and aspirates are unacceptable for Xpert Xpress SARS-CoV-2/FLU/RSV testing.  Fact Sheet for Patients: BloggerCourse.com  Fact Sheet for Healthcare Providers: SeriousBroker.it  This test is not yet approved or cleared by the [place] FDA and has been authorized for detection and/or diagnosis of SARS-CoV-2 by FDA under an Emergency Use Authorization (EUA). This EUA will remain in effect (meaning this test can be used) for the duration of the COVID-19 declaration under Section 564(b)(1) of the Act, 21 U.S.C. section 360bbb-3(b)(1), unless the authorization is terminated or revoked.     Resp Syncytial Virus by PCR NEGATIVE NEGATIVE Final    Comment: (NOTE) Fact Sheet for  Patients: BloggerCourse.com  Fact Sheet for Healthcare Providers: SeriousBroker.it  This test is not yet approved or cleared by the [place] FDA and has been authorized for detection and/or diagnosis of SARS-CoV-2 by FDA under an Emergency Use Authorization (EUA). This EUA will remain in effect (meaning this test can be used) for the duration of the COVID-19 declaration under Section 564(b)(1) of the Act, 21 U.S.C. section 360bbb-3(b)(1), unless the authorization is terminated or revoked.  Performed at [facility], [address], [place], [place] [address]   Culture, blood (routine x 2)     Status: None (Preliminary result)   Collection Time: 12/02/22 10:26 AM   Specimen: BLOOD  Result Value Ref Range Status   Specimen Description BLOOD rt wrist  Final   Special Requests   Final  BOTTLES DRAWN AEROBIC AND ANAEROBIC Blood Culture results may not be optimal due to an excessive volume of blood received in culture bottles   Culture   Final    NO GROWTH 3 DAYS Performed at [facility], [address], [place], [place] [address]    Report Status PENDING  Incomplete  Culture, blood (routine x 2)     Status: None (Preliminary result)   Collection Time: 12/02/22 10:34 AM   Specimen: BLOOD  Result Value Ref Range Status   Specimen Description BLOOD rt hand  Final   Special Requests   Final    BOTTLES DRAWN AEROBIC AND ANAEROBIC Blood Culture results may not be optimal due to an excessive volume of blood received in culture bottles   Culture   Final    NO GROWTH 3 DAYS Performed at [facility], [address] [place], [place] [address]    Report Status PENDING  Incomplete  Respiratory (~20 pathogens) panel by PCR     Status: None   Collection Time: 12/02/22  3:50 PM   Specimen: Nasopharyngeal Swab; Respiratory  Result Value Ref Range Status   Adenovirus NOT DETECTED NOT DETECTED Final   Coronavirus 229E NOT DETECTED NOT  DETECTED Final    Comment: (NOTE) The Coronavirus on the Respiratory Panel, DOES NOT test for the novel  Coronavirus (2019 nCoV)    Coronavirus HKU1 NOT DETECTED NOT DETECTED Final   Coronavirus NL63 NOT DETECTED NOT DETECTED Final   Coronavirus OC43 NOT DETECTED NOT DETECTED Final   Metapneumovirus NOT DETECTED NOT DETECTED Final   Rhinovirus / Enterovirus NOT DETECTED NOT DETECTED Final   Influenza A NOT DETECTED NOT DETECTED Final   Influenza B NOT DETECTED NOT DETECTED Final   Parainfluenza Virus 1 NOT DETECTED NOT DETECTED Final   Parainfluenza Virus 2 NOT DETECTED NOT DETECTED Final   Parainfluenza Virus 3 NOT DETECTED NOT DETECTED Final   Parainfluenza Virus 4 NOT DETECTED NOT DETECTED Final   Respiratory Syncytial Virus NOT DETECTED NOT DETECTED Final   Bordetella pertussis NOT DETECTED NOT DETECTED Final   Bordetella Parapertussis NOT DETECTED NOT DETECTED Final   Chlamydophila pneumoniae NOT DETECTED NOT DETECTED Final   Mycoplasma pneumoniae NOT DETECTED NOT DETECTED Final    Comment: Performed at [facility] - [facility], [address], [place], [place] [address]    Labs: CBC: Recent Labs  Lab 12/01/22 1353 12/02/22 1249 12/03/22 0410 12/04/22 0410  WBC 13.1* 9.2 6.6 12.6*  NEUTROABS 10.4* 7.3  --   --   HGB 14.2 12.7 12.3 12.2  HCT 45.4 39.8 38.9 38.5  MCV 93.8 92.6 92.0 92.1  PLT 308 298 310 320   Basic Metabolic Panel: Recent Labs  Lab 12/01/22 1353 12/02/22 1249 12/03/22 0410 12/04/22 0410  NA 140 143 139 141  K 2.9* 3.4* 4.2 4.0  CL 98 102 101 103  CO2 30 29 28 29   GLUCOSE 69* 70 208* 157*  BUN 29* 20 27* 25*  CREATININE 0.73 0.67 0.61 0.52  CALCIUM 9.4 9.1 8.9 9.3  MG  --  1.8 1.9 1.9   Liver Function Tests: Recent Labs  Lab 12/01/22 1353 12/02/22 1249 12/03/22 0410 12/04/22 0410  AST 215* 77* 41 25  ALT 611* 347* 259* 196*  ALKPHOS 376* 283* 253* 227*  BILITOT 1.2 1.0 0.4 0.2*  PROT 6.8 6.0* 6.0* 5.8*  ALBUMIN 3.5 3.1* 2.8* 2.9*    CBG: Recent Labs  Lab 12/01/22 1754 12/01/22 1920  GLUCAP 58* 77  Discharge time spent: greater than 30 minutes.  Signed: Tyrone Nine, MD [facility] 12/05/2022

## 2022-12-05 NOTE — Care Management Important Message (Signed)
Important Message  Patient Details  Name: [name] MRN: [id number] Date of Birth: 1950/11/02   Important Message Given:  Yes - Medicare IM     Corey Harold 12/05/2022, 8:48 AM

## 2022-12-07 LAB — CULTURE, BLOOD (ROUTINE X 2)
Culture: NO GROWTH
Culture: NO GROWTH

## 2022-12-09 ENCOUNTER — Emergency Department (HOSPITAL_COMMUNITY): Payer: 59

## 2022-12-09 ENCOUNTER — Other Ambulatory Visit: Payer: Self-pay

## 2022-12-09 ENCOUNTER — Encounter (HOSPITAL_COMMUNITY): Payer: Self-pay | Admitting: Internal Medicine

## 2022-12-09 ENCOUNTER — Observation Stay (HOSPITAL_COMMUNITY)
Admission: EM | Admit: 2022-12-09 | Discharge: 2022-12-10 | Disposition: A | Discharge status: Home / Self Care | Payer: 59 | Attending: Internal Medicine | Admitting: Internal Medicine

## 2022-12-09 DIAGNOSIS — I10 Essential (primary) hypertension: Secondary | ICD-10-CM | POA: Diagnosis present

## 2022-12-09 DIAGNOSIS — J441 Chronic obstructive pulmonary disease with (acute) exacerbation: Secondary | ICD-10-CM | POA: Diagnosis not present

## 2022-12-09 DIAGNOSIS — Z743 Need for continuous supervision: Secondary | ICD-10-CM | POA: Diagnosis not present

## 2022-12-09 DIAGNOSIS — M545 Low back pain, unspecified: Secondary | ICD-10-CM | POA: Diagnosis not present

## 2022-12-09 DIAGNOSIS — J181 Lobar pneumonia, unspecified organism: Secondary | ICD-10-CM | POA: Diagnosis not present

## 2022-12-09 DIAGNOSIS — R531 Weakness: Secondary | ICD-10-CM

## 2022-12-09 DIAGNOSIS — I110 Hypertensive heart disease with heart failure: Secondary | ICD-10-CM | POA: Insufficient documentation

## 2022-12-09 DIAGNOSIS — G894 Chronic pain syndrome: Secondary | ICD-10-CM | POA: Diagnosis not present

## 2022-12-09 DIAGNOSIS — F17210 Nicotine dependence, cigarettes, uncomplicated: Secondary | ICD-10-CM | POA: Insufficient documentation

## 2022-12-09 DIAGNOSIS — K861 Other chronic pancreatitis: Secondary | ICD-10-CM | POA: Diagnosis present

## 2022-12-09 DIAGNOSIS — I700 Atherosclerosis of aorta: Secondary | ICD-10-CM | POA: Insufficient documentation

## 2022-12-09 DIAGNOSIS — R918 Other nonspecific abnormal finding of lung field: Secondary | ICD-10-CM | POA: Diagnosis not present

## 2022-12-09 DIAGNOSIS — R1310 Dysphagia, unspecified: Secondary | ICD-10-CM | POA: Diagnosis not present

## 2022-12-09 DIAGNOSIS — F333 Major depressive disorder, recurrent, severe with psychotic symptoms: Secondary | ICD-10-CM | POA: Diagnosis not present

## 2022-12-09 DIAGNOSIS — I5032 Chronic diastolic (congestive) heart failure: Secondary | ICD-10-CM | POA: Diagnosis not present

## 2022-12-09 DIAGNOSIS — S0990XA Unspecified injury of head, initial encounter: Secondary | ICD-10-CM | POA: Insufficient documentation

## 2022-12-09 DIAGNOSIS — W19XXXA Unspecified fall, initial encounter: Secondary | ICD-10-CM

## 2022-12-09 DIAGNOSIS — J449 Chronic obstructive pulmonary disease, unspecified: Secondary | ICD-10-CM | POA: Diagnosis not present

## 2022-12-09 DIAGNOSIS — N200 Calculus of kidney: Secondary | ICD-10-CM | POA: Insufficient documentation

## 2022-12-09 DIAGNOSIS — Z8673 Personal history of transient ischemic attack (TIA), and cerebral infarction without residual deficits: Secondary | ICD-10-CM | POA: Insufficient documentation

## 2022-12-09 DIAGNOSIS — K5730 Diverticulosis of large intestine without perforation or abscess without bleeding: Secondary | ICD-10-CM | POA: Insufficient documentation

## 2022-12-09 DIAGNOSIS — Z1152 Encounter for screening for COVID-19: Secondary | ICD-10-CM | POA: Insufficient documentation

## 2022-12-09 DIAGNOSIS — R519 Headache, unspecified: Secondary | ICD-10-CM | POA: Diagnosis not present

## 2022-12-09 DIAGNOSIS — Z859 Personal history of malignant neoplasm, unspecified: Secondary | ICD-10-CM | POA: Diagnosis not present

## 2022-12-09 DIAGNOSIS — M160 Bilateral primary osteoarthritis of hip: Secondary | ICD-10-CM | POA: Diagnosis not present

## 2022-12-09 DIAGNOSIS — J189 Pneumonia, unspecified organism: Secondary | ICD-10-CM | POA: Diagnosis not present

## 2022-12-09 DIAGNOSIS — R9082 White matter disease, unspecified: Secondary | ICD-10-CM | POA: Diagnosis not present

## 2022-12-09 DIAGNOSIS — R6889 Other general symptoms and signs: Secondary | ICD-10-CM | POA: Diagnosis not present

## 2022-12-09 DIAGNOSIS — S299XXA Unspecified injury of thorax, initial encounter: Secondary | ICD-10-CM | POA: Diagnosis not present

## 2022-12-09 LAB — URINALYSIS, W/ REFLEX TO CULTURE (INFECTION SUSPECTED)
Bilirubin Urine: NEGATIVE
Glucose, UA: NEGATIVE mg/dL
Hgb urine dipstick: NEGATIVE
Ketones, ur: NEGATIVE mg/dL
Nitrite: NEGATIVE
Protein, ur: 30 mg/dL — AB
Specific Gravity, Urine: 1.017 (ref 1.005–1.030)
pH: 7 (ref 5.0–8.0)

## 2022-12-09 LAB — CBC
HCT: 41.2 % (ref 36.0–46.0)
Hemoglobin: 13.2 g/dL (ref 12.0–15.0)
MCH: 29.9 pg (ref 26.0–34.0)
MCHC: 32 g/dL (ref 30.0–36.0)
MCV: 93.4 fL (ref 80.0–100.0)
Platelets: 321 10*3/uL (ref 150–400)
RBC: 4.41 MIL/uL (ref 3.87–5.11)
RDW: 14.5 % (ref 11.5–15.5)
WBC: 18.2 10*3/uL — ABNORMAL HIGH (ref 4.0–10.5)
nRBC: 0 % (ref 0.0–0.2)

## 2022-12-09 LAB — COMPREHENSIVE METABOLIC PANEL
ALT: 60 U/L — ABNORMAL HIGH (ref 0–44)
AST: 21 U/L (ref 15–41)
Albumin: 3.2 g/dL — ABNORMAL LOW (ref 3.5–5.0)
Alkaline Phosphatase: 163 U/L — ABNORMAL HIGH (ref 38–126)
Anion gap: 11 (ref 5–15)
BUN: 20 mg/dL (ref 8–23)
CO2: 26 mmol/L (ref 22–32)
Calcium: 9 mg/dL (ref 8.9–10.3)
Chloride: 106 mmol/L (ref 98–111)
Creatinine, Ser: 0.8 mg/dL (ref 0.44–1.00)
GFR, Estimated: >60 mL/min (ref >60)
Glucose, Bld: 118 mg/dL — ABNORMAL HIGH (ref 70–99)
Potassium: 3.8 mmol/L (ref 3.5–5.1)
Sodium: 143 mmol/L (ref 135–145)
Total Bilirubin: 0.5 mg/dL (ref 0.3–1.2)
Total Protein: 6.2 g/dL — ABNORMAL LOW (ref 6.5–8.1)

## 2022-12-09 LAB — SARS CORONAVIRUS 2 BY RT PCR: SARS Coronavirus 2 by RT PCR: NEGATIVE

## 2022-12-09 LAB — ALPHA-1-ANTITRYPSIN PHENOTYP: A-1 Antitrypsin, Ser: 221 mg/dL — ABNORMAL HIGH (ref 101–187)

## 2022-12-09 LAB — CK: Total CK: 42 U/L (ref 38–234)

## 2022-12-09 LAB — MAGNESIUM: Magnesium: 2.3 mg/dL (ref 1.7–2.4)

## 2022-12-09 MED ORDER — IPRATROPIUM-ALBUTEROL 0.5-2.5 (3) MG/3ML IN SOLN: 3.0000 mL | RESPIRATORY_TRACT | Status: DC | PRN

## 2022-12-09 MED ORDER — TRAZODONE HCL 50 MG PO TABS: 50.0000 mg | ORAL_TABLET | Freq: Every evening | ORAL | Status: DC | PRN

## 2022-12-09 MED ORDER — UMECLIDINIUM BROMIDE 62.5 MCG/ACT IN AEPB
1.0000 | INHALATION_SPRAY | Freq: Every day | RESPIRATORY_TRACT | Status: DC
  Administered 2022-12-10: 1 via RESPIRATORY_TRACT
  Filled 2022-12-09: qty 7

## 2022-12-09 MED ORDER — OLANZAPINE 5 MG PO TABS
10.0000 mg | ORAL_TABLET | Freq: Every day | ORAL | Status: DC
  Administered 2022-12-09: 10 mg via ORAL
  Filled 2022-12-09: qty 2

## 2022-12-09 MED ORDER — BUPRENORPHINE HCL 2 MG SL SUBL
4.0000 mg | SUBLINGUAL_TABLET | Freq: Four times a day (QID) | SUBLINGUAL | Status: DC
  Administered 2022-12-09 – 2022-12-10 (×4): 4 mg via SUBLINGUAL
  Filled 2022-12-09 (×5): qty 2

## 2022-12-09 MED ORDER — SODIUM CHLORIDE 0.9 % IV SOLN
1.0000 g | Freq: Once | INTRAVENOUS | Status: AC
  Administered 2022-12-09: 1 g via INTRAVENOUS
  Filled 2022-12-09: qty 10

## 2022-12-09 MED ORDER — SODIUM CHLORIDE 0.9 % IV SOLN
500.0000 mg | INTRAVENOUS | Status: DC
  Administered 2022-12-10: 500 mg via INTRAVENOUS
  Filled 2022-12-09: qty 5

## 2022-12-09 MED ORDER — LEVOCETIRIZINE DIHYDROCHLORIDE 5 MG PO TABS: 5.0000 mg | ORAL_TABLET | Freq: Every day | ORAL | Status: DC

## 2022-12-09 MED ORDER — FLUTICASONE FUROATE-VILANTEROL 100-25 MCG/ACT IN AEPB
1.0000 | INHALATION_SPRAY | Freq: Every day | RESPIRATORY_TRACT | Status: DC
  Administered 2022-12-10: 1 via RESPIRATORY_TRACT
  Filled 2022-12-09: qty 28

## 2022-12-09 MED ORDER — LORATADINE 10 MG PO TABS
10.0000 mg | ORAL_TABLET | Freq: Every day | ORAL | Status: DC
  Administered 2022-12-10: 10 mg via ORAL
  Filled 2022-12-09: qty 1

## 2022-12-09 MED ORDER — ENOXAPARIN SODIUM 40 MG/0.4ML IJ SOSY
40.0000 mg | PREFILLED_SYRINGE | INTRAMUSCULAR | Status: DC
  Administered 2022-12-09: 40 mg via SUBCUTANEOUS
  Filled 2022-12-09: qty 0.4

## 2022-12-09 MED ORDER — SODIUM CHLORIDE 0.9 % IV SOLN
500.0000 mg | Freq: Once | INTRAVENOUS | Status: AC
  Administered 2022-12-09: 500 mg via INTRAVENOUS
  Filled 2022-12-09: qty 5

## 2022-12-09 MED ORDER — POLYETHYLENE GLYCOL 3350 17 G PO PACK: 17.0000 g | PACK | Freq: Every day | ORAL | Status: DC | PRN

## 2022-12-09 MED ORDER — MIRTAZAPINE 15 MG PO TABS
7.5000 mg | ORAL_TABLET | Freq: Every day | ORAL | Status: DC
  Administered 2022-12-09: 7.5 mg via ORAL
  Filled 2022-12-09: qty 1

## 2022-12-09 MED ORDER — FLUTICASONE PROPIONATE 50 MCG/ACT NA SUSP
1.0000 | Freq: Every day | NASAL | Status: DC
  Administered 2022-12-09 – 2022-12-10 (×2): 1 via NASAL
  Filled 2022-12-09: qty 16

## 2022-12-09 MED ORDER — LACTATED RINGERS IV BOLUS
500.0000 mL | Freq: Once | INTRAVENOUS | Status: AC
  Administered 2022-12-09: 500 mL via INTRAVENOUS

## 2022-12-09 MED ORDER — LACTATED RINGERS IV SOLN: INTRAVENOUS | Status: AC

## 2022-12-09 MED ORDER — SODIUM CHLORIDE 0.9 % IV SOLN
2.0000 g | INTRAVENOUS | Status: DC
  Administered 2022-12-10: 2 g via INTRAVENOUS
  Filled 2022-12-09: qty 20

## 2022-12-09 MED ORDER — ACETAMINOPHEN 325 MG PO TABS
650.0000 mg | ORAL_TABLET | Freq: Once | ORAL | Status: AC
  Administered 2022-12-09: 650 mg via ORAL
  Filled 2022-12-09: qty 2

## 2022-12-09 MED ORDER — LORAZEPAM 0.5 MG PO TABS
0.5000 mg | ORAL_TABLET | Freq: Two times a day (BID) | ORAL | Status: DC
  Administered 2022-12-10: 0.5 mg via ORAL
  Filled 2022-12-09: qty 1

## 2022-12-09 NOTE — ED Notes (Signed)
ED TO INPATIENT HANDOFF REPORT  ED Nurse Name and Phone #: Netta Corrigan [phone]  S Name/Age/Gender [name] 72 y.o. female Room/Bed: APA02/APA02  Code Status   Code Status: Prior  Home/SNF/Other Skilled nursing facility Patient oriented to: self, place, time, and situation Is this baseline? Yes   Triage Complete: Triage complete  Chief Complaint Fall [W19.XXXA]  Triage Note [facility]. Fell twice within 20 min. Fall was unwitnessed. Pt stated to ems she hit lower back and head and denied LOC. Complaints of urine being dark and foul odor. EMS VS: 115/45, 158 CBG, 98.8, 88 bpm.    Allergies Allergies  Allergen Reactions   Nsaids Other (See Comments) and Nausea And Vomiting    Stomach ulcers   Tylenol [Acetaminophen] Other (See Comments)    Inflammed Liver   Codeine Nausea And Vomiting   Tolmetin Other (See Comments)    Stomach ulcers   Tramadol Other (See Comments)    GI UPSET  Per ALF    Level of Care/Admitting Diagnosis ED Disposition     ED Disposition  Admit   Condition  --   Comment  Hospital Area: [facility] [[id number]]  Level of Care: Telemetry [5]  Covid Evaluation: Asymptomatic - no recent exposure (last 10 days) testing not required  Diagnosis: Fall [[id number]]  Admitting Physician: Onnie Boer [[id number]]  Attending Physician: Onnie Boer Xenia.Douglas          B Medical/Surgery History Past Medical History:  Diagnosis Date   Acute blood loss anemia    Arrhythmia    Arthritis    C. difficile colitis    Cancer (HCC)    COPD (chronic obstructive pulmonary disease) (HCC)    Diverticulosis    Fibromyalgia    GERD (gastroesophageal reflux disease)    GI hemorrhage    H/O: substance abuse (HCC) 2005   narcotic usage due to chronic back pain   Heart murmur    Hypertension    Pancreatic cyst    Pneumococcal meningitis    PONV (postoperative nausea and vomiting)    Renal cyst     Stroke (cerebrum) [facility])    Past Surgical History:  Procedure Laterality Date   ABDOMINAL HYSTERECTOMY     ABDOMINAL SURGERY     APPENDECTOMY     BIOPSY  04/22/2020   Procedure: BIOPSY;  Surgeon: Rachael Fee, MD;  Location: [facility];  Service: Gastroenterology;;   BREAST SURGERY     CHOLECYSTECTOMY N/A 09/30/2012   Procedure: LAPAROSCOPIC CHOLECYSTECTOMY WITH INTRAOPERATIVE CHOLANGIOGRAM;  Surgeon: Wilmon Arms. Corliss Skains, MD;  Location: [facility];  Service: General;  Laterality: N/A;   ESOPHAGOGASTRODUODENOSCOPY (EGD) WITH PROPOFOL N/A 04/22/2020   Procedure: ESOPHAGOGASTRODUODENOSCOPY (EGD) WITH PROPOFOL;  Surgeon: Rachael Fee, MD;  Location: [facility];  Service: Gastroenterology;  Laterality: N/A;   FRACTURE SURGERY     right upper arm   OVARIAN CYST SURGERY       A IV Location/Drains/Wounds Patient Lines/Drains/Airways Status     Active Line/Drains/Airways     Name Placement date Placement time Site Days   Peripheral IV 12/09/22 20 G Right Antecubital 12/09/22  1425  Antecubital  less than 1   Wound / Incision (Open or Dehisced) 04/22/20 Other (Comment) Knee Anterior;Right;Left 04/22/20  0846  Knee  961            Intake/Output Last 24 hours  Intake/Output Summary (Last 24 hours) at 12/09/2022 1833 Last data filed at  12/09/2022 1630 Gross per 24 hour  Intake 350 ml  Output --  Net 350 ml    Labs/Imaging Results for orders placed or performed during the hospital encounter of 12/09/22 (from the past 48 hour(s))  Comprehensive metabolic panel     Status: Abnormal   Collection Time: 12/09/22 11:34 AM  Result Value Ref Range   Sodium 143 135 - 145 mmol/L   Potassium 3.8 3.5 - 5.1 mmol/L   Chloride 106 98 - 111 mmol/L   CO2 26 22 - 32 mmol/L   Glucose, Bld 118 (H) 70 - 99 mg/dL    Comment: Glucose reference range applies only to samples taken after fasting for at least 8 hours.   BUN 20 8 - 23 mg/dL   Creatinine, Ser 1.61 0.44 - 1.00 mg/dL   Calcium 9.0 8.9  - 09.6 mg/dL   Total Protein 6.2 (L) 6.5 - 8.1 g/dL   Albumin 3.2 (L) 3.5 - 5.0 g/dL   AST 21 15 - 41 U/L   ALT 60 (H) 0 - 44 U/L   Alkaline Phosphatase 163 (H) 38 - 126 U/L   Total Bilirubin 0.5 0.3 - 1.2 mg/dL   GFR, Estimated >04 >54 mL/min    Comment: (NOTE) Calculated using the CKD-EPI Creatinine Equation (2021)    Anion gap 11 5 - 15    Comment: Performed at [facility], [address] [place], [place] [address]  CBC     Status: Abnormal   Collection Time: 12/09/22 11:34 AM  Result Value Ref Range   WBC 18.2 (H) 4.0 - 10.5 K/uL   RBC 4.41 3.87 - 5.11 MIL/uL   Hemoglobin 13.2 12.0 - 15.0 g/dL   HCT 91.4 78.2 - 95.6 %   MCV 93.4 80.0 - 100.0 fL   MCH 29.9 26.0 - 34.0 pg   MCHC 32.0 30.0 - 36.0 g/dL   RDW 21.3 08.6 - 57.8 %   Platelets 321 150 - 400 K/uL   nRBC 0.0 0.0 - 0.2 %    Comment: Performed at [facility], [address], [place], [place] [address]  Magnesium     Status: None   Collection Time: 12/09/22 11:34 AM  Result Value Ref Range   Magnesium 2.3 1.7 - 2.4 mg/dL    Comment: Performed at [facility], [address] [place], [place] [address]  CK     Status: None   Collection Time: 12/09/22 11:34 AM  Result Value Ref Range   Total CK 42 38 - 234 U/L    Comment: Performed at [facility], [address], [place], [place] [address]  Urinalysis, w/ Reflex to Culture (Infection Suspected) -Urine, Clean Catch     Status: Abnormal   Collection Time: 12/09/22  4:32 PM  Result Value Ref Range   Specimen Source URINE, CLEAN CATCH    Color, Urine AMBER (A) YELLOW    Comment: BIOCHEMICALS MAY BE AFFECTED BY COLOR   APPearance HAZY (A) CLEAR   Specific Gravity, Urine 1.017 1.005 - 1.030   pH 7.0 5.0 - 8.0   Glucose, UA NEGATIVE NEGATIVE mg/dL   Hgb urine dipstick NEGATIVE NEGATIVE   Bilirubin Urine NEGATIVE NEGATIVE   Ketones, ur NEGATIVE NEGATIVE mg/dL   Protein, ur 30 (A) NEGATIVE mg/dL   Nitrite NEGATIVE NEGATIVE   Leukocytes,Ua TRACE (A) NEGATIVE   RBC /  HPF 0-5 0 - 5 RBC/hpf   WBC, UA 11-20 0 - 5 WBC/hpf    Comment:  Reflex urine culture not performed if WBC <=10, OR if Squamous epithelial cells >5. If Squamous epithelial cells >5 suggest recollection.    Bacteria, UA MANY (A) NONE SEEN   Squamous Epithelial / HPF 11-20 0 - 5 /HPF   Mucus PRESENT     Comment: Performed at [facility], [address] [place], [place] [address]   DG Pelvis Portable  Result Date: 12/09/2022 CLINICAL DATA:  Larey Seat twice within 20 minutes today.  Pain. EXAM: PORTABLE PELVIS 1-2 VIEWS COMPARISON:  CT abdomen and pelvis 12/01/2022 FINDINGS: There is diffuse decreased bone mineralization. Mild bilateral femoroacetabular joint space narrowing. Mild bilateral femoral head-neck junction degenerative spurs. Mild pubic symphysis joint space narrowing and chondrocalcinosis. The bilateral sacroiliac joint spaces are maintained. No acute fracture is seen. No dislocation. IMPRESSION: 1. No acute fracture. 2. Mild bilateral femoroacetabular osteoarthritis. Electronically Signed   By: Neita Garnet M.D.   On: 12/09/2022 15:20   DG Chest Port 1 View  Result Date: 12/09/2022 CLINICAL DATA:  Trauma.  Fell twice within 20 minutes. EXAM: PORTABLE CHEST 1 VIEW COMPARISON:  Chest radiographs 03/05/2022, 02/22/2022, 10/30/2020; CT chest 12/09/2022 and 12/01/2022 FINDINGS: Cardiac silhouette and mediastinal contours are within normal limits. Moderate calcification within the aortic arch. Mild to moderate right lower lung interstitial thickening is similar to 03/05/2022. There is increased ground-glass opacity in this region on today's CT chest compared to 12/01/2022. The left lung appears clear. There are again lucency throughout the bilateral lungs consistent with chronic emphysematous changes. Flattening of the diaphragms and mild hyperinflation. No pneumothorax.  No acute skeletal abnormality. IMPRESSION: 1. Chronic emphysematous changes. 2. Mild to moderate right lower lung  interstitial thickening is similar to 03/05/2022. There is increased ground-glass opacity in this region on today's CT chest compared to 12/01/2022. This may represent superimposed atelectasis or infection. Electronically Signed   By: Neita Garnet M.D.   On: 12/09/2022 15:18   CT CHEST ABDOMEN PELVIS WO CONTRAST  Result Date: 12/09/2022 CLINICAL DATA:  Multiple falls, low back pain EXAM: CT CHEST, ABDOMEN AND PELVIS WITHOUT CONTRAST CT THORACIC AND LUMBAR SPINE WITHOUT CONTRAST TECHNIQUE: Multidetector CT imaging of the chest, abdomen and pelvis was performed following the standard protocol without IV contrast. Multidetector CT imaging of the thoracic and lumbar spine was performed following the standard protocol without IV contrast. RADIATION DOSE REDUCTION: This exam was performed according to the departmental dose-optimization program which includes automated exposure control, adjustment of the mA and/or kV according to patient size and/or use of iterative reconstruction technique. COMPARISON:  CT chest angiogram and abdomen pelvis, 12/01/2022, MR abdomen, 12/02/2022 FINDINGS: CT CHEST FINDINGS Cardiovascular: Aortic atherosclerosis. Normal heart size. No pericardial effusion. Mediastinum/Nodes: No enlarged mediastinal, hilar, or axillary lymph nodes. Thyroid gland, trachea, and esophagus demonstrate no significant findings. Lungs/Pleura: Diffuse bilateral bronchial wall thickening. Heterogeneous ground-glass airspace opacity throughout the dependent right lower lobe, new compared to prior examination (series 6, image 43). No pleural effusion or pneumothorax. Musculoskeletal: No chest wall mass or suspicious osseous lesions identified. Osteopenia. CT ABDOMEN PELVIS FINDINGS Hepatobiliary: No focal liver abnormality is seen. Status post cholecystectomy. Unchanged biliary ductal dilatation. Pancreas: Scattered calcifications throughout the pancreas. Cystic lesions within the pancreas better detailed by recent  prior MR. No pancreatic ductal dilatation or surrounding inflammatory changes. Spleen: Normal in size without significant abnormality. Adrenals/Urinary Tract: Adrenal glands are unremarkable. Multiple small bilateral nonobstructive renal calculi. No ureteral calculi or hydronephrosis. Numerous bilateral renal lesions of varying attenuation, previously characterized by MR as definitively benign simple  and hemorrhagic or proteinaceous cysts, requiring no further follow-up or characterization. Bladder is unremarkable. Stomach/Bowel: Stomach is within normal limits. Appendix appears normal. No evidence of bowel wall thickening, distention, or inflammatory changes. Sigmoid diverticulosis. Moderate burden of stool throughout the colon and rectum. Vascular/Lymphatic: Aortic atherosclerosis. No enlarged abdominal or pelvic lymph nodes. Reproductive: Status post hysterectomy. Other: No abdominal wall hernia or abnormality. No ascites. Musculoskeletal: No acute osseous findings. CT THORACIC AND LUMBAR SPINE FINDINGS Alignment: Normal thoracic kyphosis. Normal lumbar lordosis. Vertebral bodies: Osteopenia. Unchanged superior endplate wedge deformities of T11, T12, L1, and L3. Disc spaces: Mild multilevel disc space height loss and osteophytosis throughout the thoracic and lumbar spine. Congenital ankylosis of L4-L5. Paraspinous soft tissues: Unremarkable. IMPRESSION: 1. Heterogeneous ground-glass airspace opacity throughout the dependent right lower lobe, new compared to prior examination, consistent with infection or aspiration. 2. No noncontrast evidence of acute traumatic injury to the chest, abdomen, or pelvis. 3. Unchanged superior endplate wedge deformities of T11, T12, L1, and L3. 4. Osteopenia. 5. Nonobstructive bilateral nephrolithiasis. 6. Sigmoid diverticulosis without evidence of acute diverticulitis. Aortic Atherosclerosis (ICD10-I70.0). Electronically Signed   By: Jearld Lesch M.D.   On: 12/09/2022 13:46   CT  T-SPINE NO CHARGE  Result Date: 12/09/2022 CLINICAL DATA:  Multiple falls, low back pain EXAM: CT CHEST, ABDOMEN AND PELVIS WITHOUT CONTRAST CT THORACIC AND LUMBAR SPINE WITHOUT CONTRAST TECHNIQUE: Multidetector CT imaging of the chest, abdomen and pelvis was performed following the standard protocol without IV contrast. Multidetector CT imaging of the thoracic and lumbar spine was performed following the standard protocol without IV contrast. RADIATION DOSE REDUCTION: This exam was performed according to the departmental dose-optimization program which includes automated exposure control, adjustment of the mA and/or kV according to patient size and/or use of iterative reconstruction technique. COMPARISON:  CT chest angiogram and abdomen pelvis, 12/01/2022, MR abdomen, 12/02/2022 FINDINGS: CT CHEST FINDINGS Cardiovascular: Aortic atherosclerosis. Normal heart size. No pericardial effusion. Mediastinum/Nodes: No enlarged mediastinal, hilar, or axillary lymph nodes. Thyroid gland, trachea, and esophagus demonstrate no significant findings. Lungs/Pleura: Diffuse bilateral bronchial wall thickening. Heterogeneous ground-glass airspace opacity throughout the dependent right lower lobe, new compared to prior examination (series 6, image 43). No pleural effusion or pneumothorax. Musculoskeletal: No chest wall mass or suspicious osseous lesions identified. Osteopenia. CT ABDOMEN PELVIS FINDINGS Hepatobiliary: No focal liver abnormality is seen. Status post cholecystectomy. Unchanged biliary ductal dilatation. Pancreas: Scattered calcifications throughout the pancreas. Cystic lesions within the pancreas better detailed by recent prior MR. No pancreatic ductal dilatation or surrounding inflammatory changes. Spleen: Normal in size without significant abnormality. Adrenals/Urinary Tract: Adrenal glands are unremarkable. Multiple small bilateral nonobstructive renal calculi. No ureteral calculi or hydronephrosis. Numerous  bilateral renal lesions of varying attenuation, previously characterized by MR as definitively benign simple and hemorrhagic or proteinaceous cysts, requiring no further follow-up or characterization. Bladder is unremarkable. Stomach/Bowel: Stomach is within normal limits. Appendix appears normal. No evidence of bowel wall thickening, distention, or inflammatory changes. Sigmoid diverticulosis. Moderate burden of stool throughout the colon and rectum. Vascular/Lymphatic: Aortic atherosclerosis. No enlarged abdominal or pelvic lymph nodes. Reproductive: Status post hysterectomy. Other: No abdominal wall hernia or abnormality. No ascites. Musculoskeletal: No acute osseous findings. CT THORACIC AND LUMBAR SPINE FINDINGS Alignment: Normal thoracic kyphosis. Normal lumbar lordosis. Vertebral bodies: Osteopenia. Unchanged superior endplate wedge deformities of T11, T12, L1, and L3. Disc spaces: Mild multilevel disc space height loss and osteophytosis throughout the thoracic and lumbar spine. Congenital ankylosis of L4-L5. Paraspinous soft tissues: Unremarkable. IMPRESSION: 1. Heterogeneous  ground-glass airspace opacity throughout the dependent right lower lobe, new compared to prior examination, consistent with infection or aspiration. 2. No noncontrast evidence of acute traumatic injury to the chest, abdomen, or pelvis. 3. Unchanged superior endplate wedge deformities of T11, T12, L1, and L3. 4. Osteopenia. 5. Nonobstructive bilateral nephrolithiasis. 6. Sigmoid diverticulosis without evidence of acute diverticulitis. Aortic Atherosclerosis (ICD10-I70.0). Electronically Signed   By: Jearld Lesch M.D.   On: 12/09/2022 13:46   CT L-SPINE NO CHARGE  Result Date: 12/09/2022 CLINICAL DATA:  Multiple falls, low back pain EXAM: CT CHEST, ABDOMEN AND PELVIS WITHOUT CONTRAST CT THORACIC AND LUMBAR SPINE WITHOUT CONTRAST TECHNIQUE: Multidetector CT imaging of the chest, abdomen and pelvis was performed following the standard  protocol without IV contrast. Multidetector CT imaging of the thoracic and lumbar spine was performed following the standard protocol without IV contrast. RADIATION DOSE REDUCTION: This exam was performed according to the departmental dose-optimization program which includes automated exposure control, adjustment of the mA and/or kV according to patient size and/or use of iterative reconstruction technique. COMPARISON:  CT chest angiogram and abdomen pelvis, 12/01/2022, MR abdomen, 12/02/2022 FINDINGS: CT CHEST FINDINGS Cardiovascular: Aortic atherosclerosis. Normal heart size. No pericardial effusion. Mediastinum/Nodes: No enlarged mediastinal, hilar, or axillary lymph nodes. Thyroid gland, trachea, and esophagus demonstrate no significant findings. Lungs/Pleura: Diffuse bilateral bronchial wall thickening. Heterogeneous ground-glass airspace opacity throughout the dependent right lower lobe, new compared to prior examination (series 6, image 43). No pleural effusion or pneumothorax. Musculoskeletal: No chest wall mass or suspicious osseous lesions identified. Osteopenia. CT ABDOMEN PELVIS FINDINGS Hepatobiliary: No focal liver abnormality is seen. Status post cholecystectomy. Unchanged biliary ductal dilatation. Pancreas: Scattered calcifications throughout the pancreas. Cystic lesions within the pancreas better detailed by recent prior MR. No pancreatic ductal dilatation or surrounding inflammatory changes. Spleen: Normal in size without significant abnormality. Adrenals/Urinary Tract: Adrenal glands are unremarkable. Multiple small bilateral nonobstructive renal calculi. No ureteral calculi or hydronephrosis. Numerous bilateral renal lesions of varying attenuation, previously characterized by MR as definitively benign simple and hemorrhagic or proteinaceous cysts, requiring no further follow-up or characterization. Bladder is unremarkable. Stomach/Bowel: Stomach is within normal limits. Appendix appears normal.  No evidence of bowel wall thickening, distention, or inflammatory changes. Sigmoid diverticulosis. Moderate burden of stool throughout the colon and rectum. Vascular/Lymphatic: Aortic atherosclerosis. No enlarged abdominal or pelvic lymph nodes. Reproductive: Status post hysterectomy. Other: No abdominal wall hernia or abnormality. No ascites. Musculoskeletal: No acute osseous findings. CT THORACIC AND LUMBAR SPINE FINDINGS Alignment: Normal thoracic kyphosis. Normal lumbar lordosis. Vertebral bodies: Osteopenia. Unchanged superior endplate wedge deformities of T11, T12, L1, and L3. Disc spaces: Mild multilevel disc space height loss and osteophytosis throughout the thoracic and lumbar spine. Congenital ankylosis of L4-L5. Paraspinous soft tissues: Unremarkable. IMPRESSION: 1. Heterogeneous ground-glass airspace opacity throughout the dependent right lower lobe, new compared to prior examination, consistent with infection or aspiration. 2. No noncontrast evidence of acute traumatic injury to the chest, abdomen, or pelvis. 3. Unchanged superior endplate wedge deformities of T11, T12, L1, and L3. 4. Osteopenia. 5. Nonobstructive bilateral nephrolithiasis. 6. Sigmoid diverticulosis without evidence of acute diverticulitis. Aortic Atherosclerosis (ICD10-I70.0). Electronically Signed   By: Jearld Lesch M.D.   On: 12/09/2022 13:46   CT HEAD WO CONTRAST  Result Date: 12/09/2022 CLINICAL DATA:  Multiple falls, pain EXAM: CT HEAD WITHOUT CONTRAST CT CERVICAL SPINE WITHOUT CONTRAST TECHNIQUE: Multidetector CT imaging of the head and cervical spine was performed following the standard protocol without intravenous contrast. Multiplanar CT image reconstructions of  the cervical spine were also generated. RADIATION DOSE REDUCTION: This exam was performed according to the departmental dose-optimization program which includes automated exposure control, adjustment of the mA and/or kV according to patient size and/or use of  iterative reconstruction technique. COMPARISON:  None Available. FINDINGS: CT HEAD FINDINGS Brain: No evidence of acute infarction, hemorrhage, hydrocephalus, extra-axial collection or mass lesion/mass effect. Mild periventricular white matter hypodensity. Vascular: No hyperdense vessel or unexpected calcification. Skull: Normal. Negative for fracture or focal lesion. Sinuses/Orbits: No acute finding. Other: None. CT CERVICAL SPINE FINDINGS Alignment: Degenerative straightening and reversal of the normal cervical lordosis. Skull base and vertebrae: No acute fracture. No primary bone lesion or focal pathologic process. Soft tissues and spinal canal: No prevertebral fluid or swelling. No visible canal hematoma. Disc levels: Ankylosis of C5-C6 with moderate disc degenerative disease C7-T1. Upper chest: Negative. Other: None. IMPRESSION: 1. No acute intracranial pathology. Mild small-vessel white matter disease. 2. No fracture or static subluxation of the cervical spine. 3. Ankylosis of C5-C6 with moderate disc degenerative disease C7-T1. Electronically Signed   By: Jearld Lesch M.D.   On: 12/09/2022 13:33   CT CERVICAL SPINE WO CONTRAST  Result Date: 12/09/2022 CLINICAL DATA:  Multiple falls, pain EXAM: CT HEAD WITHOUT CONTRAST CT CERVICAL SPINE WITHOUT CONTRAST TECHNIQUE: Multidetector CT imaging of the head and cervical spine was performed following the standard protocol without intravenous contrast. Multiplanar CT image reconstructions of the cervical spine were also generated. RADIATION DOSE REDUCTION: This exam was performed according to the departmental dose-optimization program which includes automated exposure control, adjustment of the mA and/or kV according to patient size and/or use of iterative reconstruction technique. COMPARISON:  None Available. FINDINGS: CT HEAD FINDINGS Brain: No evidence of acute infarction, hemorrhage, hydrocephalus, extra-axial collection or mass lesion/mass effect. Mild  periventricular white matter hypodensity. Vascular: No hyperdense vessel or unexpected calcification. Skull: Normal. Negative for fracture or focal lesion. Sinuses/Orbits: No acute finding. Other: None. CT CERVICAL SPINE FINDINGS Alignment: Degenerative straightening and reversal of the normal cervical lordosis. Skull base and vertebrae: No acute fracture. No primary bone lesion or focal pathologic process. Soft tissues and spinal canal: No prevertebral fluid or swelling. No visible canal hematoma. Disc levels: Ankylosis of C5-C6 with moderate disc degenerative disease C7-T1. Upper chest: Negative. Other: None. IMPRESSION: 1. No acute intracranial pathology. Mild small-vessel white matter disease. 2. No fracture or static subluxation of the cervical spine. 3. Ankylosis of C5-C6 with moderate disc degenerative disease C7-T1. Electronically Signed   By: Jearld Lesch M.D.   On: 12/09/2022 13:33    Pending Labs Unresulted Labs (From admission, onward)    None       Vitals/Pain Today's Vitals   12/09/22 1632 12/09/22 1700 12/09/22 1715 12/09/22 1730  BP:  (!) 121/55 (!) 118/51 (!) 114/54  Pulse:   61 62  Resp:  11 11 15   Temp: 98.1 F (36.7 C)     TempSrc: Oral     SpO2:   96% 95%  Weight:      Height:      PainSc:        Isolation Precautions No active isolations  Medications Medications  acetaminophen (TYLENOL) tablet 650 mg (650 mg Oral Given 12/09/22 1255)  cefTRIAXone (ROCEPHIN) 1 g in sodium chloride 0.9 % 100 mL IVPB (0 g Intravenous Stopped 12/09/22 1630)  azithromycin (ZITHROMAX) 500 mg in sodium chloride 0.9 % 250 mL IVPB (0 mg Intravenous Stopped 12/09/22 1630)  lactated ringers bolus 500 mL (500 mLs Intravenous  New Bag/Given 12/09/22 1514)  lactated ringers bolus 500 mL (500 mLs Intravenous Bolus 12/09/22 1629)    Mobility walks with device     Focused Assessments Neuro Assessment Handoff:  Swallow screen pass? Yes          Neuro Assessment: Exceptions to  WDL Neuro Checks:    R Recommendations: See Admitting Provider Note  Report given to:   Additional Notes: initially pt came in for neuro eval pt bp is soft

## 2022-12-09 NOTE — Assessment & Plan Note (Signed)
Systolic 100-121. -Resume losartan in a.m.

## 2022-12-09 NOTE — H&P (Signed)
History and Physical    [name] QMV:[id number] DOB: 1951-02-29 DOA: 12/09/2022  PCP: Frederich Chick., MD   Patient coming from: Assisted Living facility  I have personally briefly reviewed patient's old medical records in [facility]  Chief Complaint: Falls  HPI: [name] is a 72 y.o. female with medical history significant for stroke, chronic pancreatitis, COPD, stage IV decubitus ulcer, diastolic CHF. Patient presented to the ED from assisted living facility with reports of 2 falls today, and generalized weakness.  Patient tells me she felt weak all over and this resulted in the fall.  No difficulty breathing.  No cough.  No chest pain.  No dizziness.  No vomiting.  No loose stools.  Reports urinary symptoms of frequency and dysuria.  She denies problems with swallowing She reports chronic unchanged low back pain, chronic unchanged abdominal pain related to her chronic pancreatitis.  Recently hospitalized 9/22 to 9/26 for COPD exacerbation, hypoxic and hypercarbic respiratory failure, also had elevated LFTs, she was to follow-up outpatient for EUS.  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   ED Course: Tmax 98.8.  Heart rate 61-69.  Respirate rate 11-15.  Blood pressure systolic 100-124.  O2 sats greater than 93% on room air.  Leukocytosis of 18.2.  Head and cervical CT, pelvic xray negative for acute abnormality.   - CT lumbar and thoracic spine-  Unchanged superior endplate wedge deformities of T11, T12, L1, and L3. - CTA chest abdomen and pelvis without contrast- Heterogeneous ground-glass airspace opacity throughout the dependent right lower lobe, new compared to prior examination, consistent with infection or aspiration. IV ceftriaxone and azithromycin started. 1 Liter bolus given.  Review of Systems: As per HPI all other systems reviewed and negative.  Past Medical History:  Diagnosis  Date   Acute blood loss anemia    Arrhythmia    Arthritis    C. difficile colitis    Cancer (HCC)    COPD (chronic obstructive pulmonary disease) (HCC)    Diverticulosis    Fibromyalgia    GERD (gastroesophageal reflux disease)    GI hemorrhage    H/O: substance abuse (HCC) 2005   narcotic usage due to chronic back pain   Heart murmur    Hypertension    Pancreatic cyst    Pneumococcal meningitis    PONV (postoperative nausea and vomiting)    Renal cyst    Stroke (cerebrum) [facility])     Past Surgical History:  Procedure Laterality Date   ABDOMINAL HYSTERECTOMY     ABDOMINAL SURGERY     APPENDECTOMY     BIOPSY  04/22/2020   Procedure: BIOPSY;  Surgeon: Rachael Fee, MD;  Location: [facility];  Service: Gastroenterology;;   BREAST SURGERY     CHOLECYSTECTOMY N/A 09/30/2012   Procedure: LAPAROSCOPIC CHOLECYSTECTOMY WITH INTRAOPERATIVE CHOLANGIOGRAM;  Surgeon: Wilmon Arms. Corliss Skains, MD;  Location: [facility];  Service: General;  Laterality: N/A;   ESOPHAGOGASTRODUODENOSCOPY (EGD) WITH PROPOFOL N/A 04/22/2020   Procedure: ESOPHAGOGASTRODUODENOSCOPY (EGD) WITH PROPOFOL;  Surgeon: Rachael Fee, MD;  Location: [facility];  Service: Gastroenterology;  Laterality: N/A;   FRACTURE SURGERY     right upper arm   OVARIAN CYST SURGERY       reports that she has been smoking cigarettes. She has a 19.6 pack-year smoking history. She has never used smokeless tobacco. She reports that she does not drink alcohol and does not use drugs.  Allergies  Allergen Reactions   Nsaids Other (See Comments) and Nausea And Vomiting    Stomach ulcers   Tylenol [Acetaminophen] Other (See Comments)    Inflammed Liver   Codeine Nausea And Vomiting   Tolmetin Other (See Comments)    Stomach ulcers   Tramadol Other (See Comments)    GI UPSET  Per ALF    Family History  Problem Relation Age of Onset   Alzheimer's disease Mother    Heart disease Father    Prostate cancer Father     Prior to  Admission medications   Medication Sig Start Date End Date Taking? Authorizing Provider  albuterol (VENTOLIN HFA) 108 (90 Base) MCG/ACT inhaler Inhale 1-2 puffs into the lungs every 6 (six) hours as needed for wheezing or shortness of breath. 02/28/22  Yes [provider]  ascorbic acid (VITAMIN C) 500 MG tablet Take 500 mg by mouth daily.   Yes [provider]  buprenorphine (SUBUTEX) 8 MG SUBL SL tablet Place 4 mg under the tongue every 6 (six) hours.   Yes [provider]  Cholecalciferol (VITAMIN D-3 PO) Take 1 tablet by mouth 4 (four) times daily.   Yes [provider]  fluticasone (FLONASE) 50 MCG/ACT nasal spray Place 1 spray into both nostrils daily. 11/12/22  Yes [provider]  levocetirizine (XYZAL) 5 MG tablet Take 5 mg by mouth daily. 11/22/22  Yes [provider]  LORazepam (ATIVAN) 0.5 MG tablet Take 0.5 mg by mouth 2 (two) times daily. Do not administer AFTER 3   Yes [provider]  losartan (COZAAR) 100 MG tablet Take 100 mg by mouth daily. 02/15/22  Yes [provider]  MAGNESIUM CITRATE PO Take 1 capsule by mouth at bedtime.   Yes [provider]  magnesium oxide (MAG-OX) 400 (240 Mg) MG tablet Take 1 tablet by mouth daily. 11/27/22  Yes [provider]  mirtazapine (REMERON) 7.5 MG tablet Take 7.5 mg by mouth at bedtime. 02/20/22  Yes [provider]  Multiple Vitamins-Minerals (THERA-TABS M) TABS Take 1 tablet by mouth daily. 11/27/22  Yes [provider]  naloxone (NARCAN) nasal spray 4 mg/0.1 mL Place 1 spray into the nose once. 12/04/21  Yes [provider]  OLANZapine (ZYPREXA) 10 MG tablet Take 1 tablet (10 mg total) by mouth at bedtime. 03/08/22  Yes Shah, Pratik D, DO  traMADol (ULTRAM) 50 MG tablet Take 100 mg by mouth 4 (four) times daily.   Yes [provider]  traZODone (DESYREL) 50 MG tablet Take 50 mg by mouth at bedtime as needed for sleep.  11/27/22  Yes [provider]  TRELEGY ELLIPTA 100-62.5-25 MCG/ACT AEPB Inhale 1 puff into the lungs daily. 02/28/22  Yes [provider]  donepezil (ARICEPT) 10 MG tablet Take 10 mg by mouth daily. 02/15/22   [provider]  doxepin (SINEQUAN) 50 MG capsule Take 1 capsule (50 mg total) by mouth daily. Patient not taking: Reported on 12/09/2022 03/08/22   Sherryll Burger, Pratik D, DO  ELIQUIS 5 MG TABS tablet Take 5 mg by mouth 2 (two) times daily. Patient not taking: Reported on 12/09/2022 11/27/22   [provider]  famotidine (PEPCID) 20 MG tablet Take 20 mg by mouth daily. Patient not taking: Reported on 12/09/2022 11/27/22   [provider]  ipratropium (ATROVENT) 0.03 % nasal spray Place 1 spray into both nostrils 2 (two) times daily. Patient not taking: Reported on 12/09/2022    [provider]  lipase/protease/amylase (  CREON) 36000 UNITS CPEP capsule Take 1 capsule (36,000 Units total) by mouth 3 (three) times daily before meals. Patient not taking: Reported on 12/09/2022 12/04/22   Tyrone Nine, MD    Physical Exam: Vitals:   12/09/22 1632 12/09/22 1700 12/09/22 1715 12/09/22 1730  BP:  (!) 121/55 (!) 118/51 (!) 114/54  Pulse:   61 62  Resp:  11 11 15   Temp: 98.1 F (36.7 C)     TempSrc: Oral     SpO2:   96% 95%  Weight:      Height:        Constitutional: NAD, calm, comfortable Vitals:   12/09/22 1632 12/09/22 1700 12/09/22 1715 12/09/22 1730  BP:  (!) 121/55 (!) 118/51 (!) 114/54  Pulse:   61 62  Resp:  11 11 15   Temp: 98.1 F (36.7 C)     TempSrc: Oral     SpO2:   96% 95%  Weight:      Height:       Eyes: PERRL, lids and conjunctivae normal ENMT: Mucous membranes are mildly dry Neck: normal, supple, no masses, no thyromegaly Respiratory: clear to auscultation bilaterally, no wheezing, no crackles. Normal respiratory effort. No accessory muscle use.  Cardiovascular: Regular rate and rhythm, no murmurs / rubs / gallops. No  extremity edema.  Extremities warm.   Abdomen: no tenderness, no masses palpated. No hepatosplenomegaly  Musculoskeletal: no clubbing / cyanosis. No joint deformity upper and lower extremities.  Skin: no rashes, lesions, ulcers. No induration Neurologic:  no facial asymmetry, moving all extremities against gravity, 4+/5 strength in all extremities Psychiatric: Normal judgment and insight. Alert and oriented x 3. Normal mood.   Labs on Admission: I have personally reviewed following labs and imaging studies  CBC: Recent Labs  Lab 12/03/22 0410 12/04/22 0410 12/09/22 1134  WBC 6.6 12.6* 18.2*  HGB 12.3 12.2 13.2  HCT 38.9 38.5 41.2  MCV 92.0 92.1 93.4  PLT 310 320 321   Basic Metabolic Panel: Recent Labs  Lab 12/03/22 0410 12/04/22 0410 12/09/22 1134  NA 139 141 143  K 4.2 4.0 3.8  CL 101 103 106  CO2 28 29 26   GLUCOSE 208* 157* 118*  BUN 27* 25* 20  CREATININE 0.61 0.52 0.80  CALCIUM 8.9 9.3 9.0  MG 1.9 1.9 2.3   GFR: Estimated Creatinine Clearance: 47 mL/min (by C-G formula based on SCr of 0.8 mg/dL). Liver Function Tests: Recent Labs  Lab 12/03/22 0410 12/04/22 0410 12/09/22 1134  AST 41 25 21  ALT 259* 196* 60*  ALKPHOS 253* 227* 163*  BILITOT 0.4 0.2* 0.5  PROT 6.0* 5.8* 6.2*  ALBUMIN 2.8* 2.9* 3.2*   Coagulation Profile: Recent Labs  Lab 12/03/22 0410  INR 1.1   Cardiac Enzymes: Recent Labs  Lab 12/09/22 1134  CKTOTAL 42   Urine analysis:    Component Value Date/Time   COLORURINE AMBER (A) 12/09/2022 1632   APPEARANCEUR HAZY (A) 12/09/2022 1632   LABSPEC 1.017 12/09/2022 1632   PHURINE 7.0 12/09/2022 1632   GLUCOSEU NEGATIVE 12/09/2022 1632   HGBUR NEGATIVE 12/09/2022 1632   BILIRUBINUR NEGATIVE 12/09/2022 1632   KETONESUR NEGATIVE 12/09/2022 1632   PROTEINUR 30 (A) 12/09/2022 1632   UROBILINOGEN 0.2 05/26/2013 1730   NITRITE NEGATIVE 12/09/2022 1632   LEUKOCYTESUR TRACE (A) 12/09/2022 1632    Radiological Exams on Admission: DG  Pelvis Portable  Result Date: 12/09/2022 CLINICAL DATA:  Larey Seat twice within 20 minutes today.  Pain. EXAM: PORTABLE PELVIS  1-2 VIEWS COMPARISON:  CT abdomen and pelvis 12/01/2022 FINDINGS: There is diffuse decreased bone mineralization. Mild bilateral femoroacetabular joint space narrowing. Mild bilateral femoral head-neck junction degenerative spurs. Mild pubic symphysis joint space narrowing and chondrocalcinosis. The bilateral sacroiliac joint spaces are maintained. No acute fracture is seen. No dislocation. IMPRESSION: 1. No acute fracture. 2. Mild bilateral femoroacetabular osteoarthritis. Electronically Signed   By: Neita Garnet M.D.   On: 12/09/2022 15:20   DG Chest Port 1 View  Result Date: 12/09/2022 CLINICAL DATA:  Trauma.  Fell twice within 20 minutes. EXAM: PORTABLE CHEST 1 VIEW COMPARISON:  Chest radiographs 03/05/2022, 02/22/2022, 10/30/2020; CT chest 12/09/2022 and 12/01/2022 FINDINGS: Cardiac silhouette and mediastinal contours are within normal limits. Moderate calcification within the aortic arch. Mild to moderate right lower lung interstitial thickening is similar to 03/05/2022. There is increased ground-glass opacity in this region on today's CT chest compared to 12/01/2022. The left lung appears clear. There are again lucency throughout the bilateral lungs consistent with chronic emphysematous changes. Flattening of the diaphragms and mild hyperinflation. No pneumothorax.  No acute skeletal abnormality. IMPRESSION: 1. Chronic emphysematous changes. 2. Mild to moderate right lower lung interstitial thickening is similar to 03/05/2022. There is increased ground-glass opacity in this region on today's CT chest compared to 12/01/2022. This may represent superimposed atelectasis or infection. Electronically Signed   By: Neita Garnet M.D.   On: 12/09/2022 15:18   CT CHEST ABDOMEN PELVIS WO CONTRAST  Result Date: 12/09/2022 CLINICAL DATA:  Multiple falls, low back pain EXAM: CT CHEST,  ABDOMEN AND PELVIS WITHOUT CONTRAST CT THORACIC AND LUMBAR SPINE WITHOUT CONTRAST TECHNIQUE: Multidetector CT imaging of the chest, abdomen and pelvis was performed following the standard protocol without IV contrast. Multidetector CT imaging of the thoracic and lumbar spine was performed following the standard protocol without IV contrast. RADIATION DOSE REDUCTION: This exam was performed according to the departmental dose-optimization program which includes automated exposure control, adjustment of the mA and/or kV according to patient size and/or use of iterative reconstruction technique. COMPARISON:  CT chest angiogram and abdomen pelvis, 12/01/2022, MR abdomen, 12/02/2022 FINDINGS: CT CHEST FINDINGS Cardiovascular: Aortic atherosclerosis. Normal heart size. No pericardial effusion. Mediastinum/Nodes: No enlarged mediastinal, hilar, or axillary lymph nodes. Thyroid gland, trachea, and esophagus demonstrate no significant findings. Lungs/Pleura: Diffuse bilateral bronchial wall thickening. Heterogeneous ground-glass airspace opacity throughout the dependent right lower lobe, new compared to prior examination (series 6, image 43). No pleural effusion or pneumothorax. Musculoskeletal: No chest wall mass or suspicious osseous lesions identified. Osteopenia. CT ABDOMEN PELVIS FINDINGS Hepatobiliary: No focal liver abnormality is seen. Status post cholecystectomy. Unchanged biliary ductal dilatation. Pancreas: Scattered calcifications throughout the pancreas. Cystic lesions within the pancreas better detailed by recent prior MR. No pancreatic ductal dilatation or surrounding inflammatory changes. Spleen: Normal in size without significant abnormality. Adrenals/Urinary Tract: Adrenal glands are unremarkable. Multiple small bilateral nonobstructive renal calculi. No ureteral calculi or hydronephrosis. Numerous bilateral renal lesions of varying attenuation, previously characterized by MR as definitively benign simple  and hemorrhagic or proteinaceous cysts, requiring no further follow-up or characterization. Bladder is unremarkable. Stomach/Bowel: Stomach is within normal limits. Appendix appears normal. No evidence of bowel wall thickening, distention, or inflammatory changes. Sigmoid diverticulosis. Moderate burden of stool throughout the colon and rectum. Vascular/Lymphatic: Aortic atherosclerosis. No enlarged abdominal or pelvic lymph nodes. Reproductive: Status post hysterectomy. Other: No abdominal wall hernia or abnormality. No ascites. Musculoskeletal: No acute osseous findings. CT THORACIC AND LUMBAR SPINE FINDINGS Alignment: Normal thoracic kyphosis. Normal lumbar  lordosis. Vertebral bodies: Osteopenia. Unchanged superior endplate wedge deformities of T11, T12, L1, and L3. Disc spaces: Mild multilevel disc space height loss and osteophytosis throughout the thoracic and lumbar spine. Congenital ankylosis of L4-L5. Paraspinous soft tissues: Unremarkable. IMPRESSION: 1. Heterogeneous ground-glass airspace opacity throughout the dependent right lower lobe, new compared to prior examination, consistent with infection or aspiration. 2. No noncontrast evidence of acute traumatic injury to the chest, abdomen, or pelvis. 3. Unchanged superior endplate wedge deformities of T11, T12, L1, and L3. 4. Osteopenia. 5. Nonobstructive bilateral nephrolithiasis. 6. Sigmoid diverticulosis without evidence of acute diverticulitis. Aortic Atherosclerosis (ICD10-I70.0). Electronically Signed   By: Jearld Lesch M.D.   On: 12/09/2022 13:46   CT T-SPINE NO CHARGE  Result Date: 12/09/2022 CLINICAL DATA:  Multiple falls, low back pain EXAM: CT CHEST, ABDOMEN AND PELVIS WITHOUT CONTRAST CT THORACIC AND LUMBAR SPINE WITHOUT CONTRAST TECHNIQUE: Multidetector CT imaging of the chest, abdomen and pelvis was performed following the standard protocol without IV contrast. Multidetector CT imaging of the thoracic and lumbar spine was performed  following the standard protocol without IV contrast. RADIATION DOSE REDUCTION: This exam was performed according to the departmental dose-optimization program which includes automated exposure control, adjustment of the mA and/or kV according to patient size and/or use of iterative reconstruction technique. COMPARISON:  CT chest angiogram and abdomen pelvis, 12/01/2022, MR abdomen, 12/02/2022 FINDINGS: CT CHEST FINDINGS Cardiovascular: Aortic atherosclerosis. Normal heart size. No pericardial effusion. Mediastinum/Nodes: No enlarged mediastinal, hilar, or axillary lymph nodes. Thyroid gland, trachea, and esophagus demonstrate no significant findings. Lungs/Pleura: Diffuse bilateral bronchial wall thickening. Heterogeneous ground-glass airspace opacity throughout the dependent right lower lobe, new compared to prior examination (series 6, image 43). No pleural effusion or pneumothorax. Musculoskeletal: No chest wall mass or suspicious osseous lesions identified. Osteopenia. CT ABDOMEN PELVIS FINDINGS Hepatobiliary: No focal liver abnormality is seen. Status post cholecystectomy. Unchanged biliary ductal dilatation. Pancreas: Scattered calcifications throughout the pancreas. Cystic lesions within the pancreas better detailed by recent prior MR. No pancreatic ductal dilatation or surrounding inflammatory changes. Spleen: Normal in size without significant abnormality. Adrenals/Urinary Tract: Adrenal glands are unremarkable. Multiple small bilateral nonobstructive renal calculi. No ureteral calculi or hydronephrosis. Numerous bilateral renal lesions of varying attenuation, previously characterized by MR as definitively benign simple and hemorrhagic or proteinaceous cysts, requiring no further follow-up or characterization. Bladder is unremarkable. Stomach/Bowel: Stomach is within normal limits. Appendix appears normal. No evidence of bowel wall thickening, distention, or inflammatory changes. Sigmoid diverticulosis.  Moderate burden of stool throughout the colon and rectum. Vascular/Lymphatic: Aortic atherosclerosis. No enlarged abdominal or pelvic lymph nodes. Reproductive: Status post hysterectomy. Other: No abdominal wall hernia or abnormality. No ascites. Musculoskeletal: No acute osseous findings. CT THORACIC AND LUMBAR SPINE FINDINGS Alignment: Normal thoracic kyphosis. Normal lumbar lordosis. Vertebral bodies: Osteopenia. Unchanged superior endplate wedge deformities of T11, T12, L1, and L3. Disc spaces: Mild multilevel disc space height loss and osteophytosis throughout the thoracic and lumbar spine. Congenital ankylosis of L4-L5. Paraspinous soft tissues: Unremarkable. IMPRESSION: 1. Heterogeneous ground-glass airspace opacity throughout the dependent right lower lobe, new compared to prior examination, consistent with infection or aspiration. 2. No noncontrast evidence of acute traumatic injury to the chest, abdomen, or pelvis. 3. Unchanged superior endplate wedge deformities of T11, T12, L1, and L3. 4. Osteopenia. 5. Nonobstructive bilateral nephrolithiasis. 6. Sigmoid diverticulosis without evidence of acute diverticulitis. Aortic Atherosclerosis (ICD10-I70.0). Electronically Signed   By: Jearld Lesch M.D.   On: 12/09/2022 13:46   CT L-SPINE NO CHARGE  Result  Date: 12/09/2022 CLINICAL DATA:  Multiple falls, low back pain EXAM: CT CHEST, ABDOMEN AND PELVIS WITHOUT CONTRAST CT THORACIC AND LUMBAR SPINE WITHOUT CONTRAST TECHNIQUE: Multidetector CT imaging of the chest, abdomen and pelvis was performed following the standard protocol without IV contrast. Multidetector CT imaging of the thoracic and lumbar spine was performed following the standard protocol without IV contrast. RADIATION DOSE REDUCTION: This exam was performed according to the departmental dose-optimization program which includes automated exposure control, adjustment of the mA and/or kV according to patient size and/or use of iterative  reconstruction technique. COMPARISON:  CT chest angiogram and abdomen pelvis, 12/01/2022, MR abdomen, 12/02/2022 FINDINGS: CT CHEST FINDINGS Cardiovascular: Aortic atherosclerosis. Normal heart size. No pericardial effusion. Mediastinum/Nodes: No enlarged mediastinal, hilar, or axillary lymph nodes. Thyroid gland, trachea, and esophagus demonstrate no significant findings. Lungs/Pleura: Diffuse bilateral bronchial wall thickening. Heterogeneous ground-glass airspace opacity throughout the dependent right lower lobe, new compared to prior examination (series 6, image 43). No pleural effusion or pneumothorax. Musculoskeletal: No chest wall mass or suspicious osseous lesions identified. Osteopenia. CT ABDOMEN PELVIS FINDINGS Hepatobiliary: No focal liver abnormality is seen. Status post cholecystectomy. Unchanged biliary ductal dilatation. Pancreas: Scattered calcifications throughout the pancreas. Cystic lesions within the pancreas better detailed by recent prior MR. No pancreatic ductal dilatation or surrounding inflammatory changes. Spleen: Normal in size without significant abnormality. Adrenals/Urinary Tract: Adrenal glands are unremarkable. Multiple small bilateral nonobstructive renal calculi. No ureteral calculi or hydronephrosis. Numerous bilateral renal lesions of varying attenuation, previously characterized by MR as definitively benign simple and hemorrhagic or proteinaceous cysts, requiring no further follow-up or characterization. Bladder is unremarkable. Stomach/Bowel: Stomach is within normal limits. Appendix appears normal. No evidence of bowel wall thickening, distention, or inflammatory changes. Sigmoid diverticulosis. Moderate burden of stool throughout the colon and rectum. Vascular/Lymphatic: Aortic atherosclerosis. No enlarged abdominal or pelvic lymph nodes. Reproductive: Status post hysterectomy. Other: No abdominal wall hernia or abnormality. No ascites. Musculoskeletal: No acute osseous  findings. CT THORACIC AND LUMBAR SPINE FINDINGS Alignment: Normal thoracic kyphosis. Normal lumbar lordosis. Vertebral bodies: Osteopenia. Unchanged superior endplate wedge deformities of T11, T12, L1, and L3. Disc spaces: Mild multilevel disc space height loss and osteophytosis throughout the thoracic and lumbar spine. Congenital ankylosis of L4-L5. Paraspinous soft tissues: Unremarkable. IMPRESSION: 1. Heterogeneous ground-glass airspace opacity throughout the dependent right lower lobe, new compared to prior examination, consistent with infection or aspiration. 2. No noncontrast evidence of acute traumatic injury to the chest, abdomen, or pelvis. 3. Unchanged superior endplate wedge deformities of T11, T12, L1, and L3. 4. Osteopenia. 5. Nonobstructive bilateral nephrolithiasis. 6. Sigmoid diverticulosis without evidence of acute diverticulitis. Aortic Atherosclerosis (ICD10-I70.0). Electronically Signed   By: Jearld Lesch M.D.   On: 12/09/2022 13:46   CT HEAD WO CONTRAST  Result Date: 12/09/2022 CLINICAL DATA:  Multiple falls, pain EXAM: CT HEAD WITHOUT CONTRAST CT CERVICAL SPINE WITHOUT CONTRAST TECHNIQUE: Multidetector CT imaging of the head and cervical spine was performed following the standard protocol without intravenous contrast. Multiplanar CT image reconstructions of the cervical spine were also generated. RADIATION DOSE REDUCTION: This exam was performed according to the departmental dose-optimization program which includes automated exposure control, adjustment of the mA and/or kV according to patient size and/or use of iterative reconstruction technique. COMPARISON:  None Available. FINDINGS: CT HEAD FINDINGS Brain: No evidence of acute infarction, hemorrhage, hydrocephalus, extra-axial collection or mass lesion/mass effect. Mild periventricular white matter hypodensity. Vascular: No hyperdense vessel or unexpected calcification. Skull: Normal. Negative for fracture or focal lesion.  Sinuses/Orbits:  No acute finding. Other: None. CT CERVICAL SPINE FINDINGS Alignment: Degenerative straightening and reversal of the normal cervical lordosis. Skull base and vertebrae: No acute fracture. No primary bone lesion or focal pathologic process. Soft tissues and spinal canal: No prevertebral fluid or swelling. No visible canal hematoma. Disc levels: Ankylosis of C5-C6 with moderate disc degenerative disease C7-T1. Upper chest: Negative. Other: None. IMPRESSION: 1. No acute intracranial pathology. Mild small-vessel white matter disease. 2. No fracture or static subluxation of the cervical spine. 3. Ankylosis of C5-C6 with moderate disc degenerative disease C7-T1. Electronically Signed   By: Jearld Lesch M.D.   On: 12/09/2022 13:33   CT CERVICAL SPINE WO CONTRAST  Result Date: 12/09/2022 CLINICAL DATA:  Multiple falls, pain EXAM: CT HEAD WITHOUT CONTRAST CT CERVICAL SPINE WITHOUT CONTRAST TECHNIQUE: Multidetector CT imaging of the head and cervical spine was performed following the standard protocol without intravenous contrast. Multiplanar CT image reconstructions of the cervical spine were also generated. RADIATION DOSE REDUCTION: This exam was performed according to the departmental dose-optimization program which includes automated exposure control, adjustment of the mA and/or kV according to patient size and/or use of iterative reconstruction technique. COMPARISON:  None Available. FINDINGS: CT HEAD FINDINGS Brain: No evidence of acute infarction, hemorrhage, hydrocephalus, extra-axial collection or mass lesion/mass effect. Mild periventricular white matter hypodensity. Vascular: No hyperdense vessel or unexpected calcification. Skull: Normal. Negative for fracture or focal lesion. Sinuses/Orbits: No acute finding. Other: None. CT CERVICAL SPINE FINDINGS Alignment: Degenerative straightening and reversal of the normal cervical lordosis. Skull base and vertebrae: No acute fracture. No primary bone  lesion or focal pathologic process. Soft tissues and spinal canal: No prevertebral fluid or swelling. No visible canal hematoma. Disc levels: Ankylosis of C5-C6 with moderate disc degenerative disease C7-T1. Upper chest: Negative. Other: None. IMPRESSION: 1. No acute intracranial pathology. Mild small-vessel white matter disease. 2. No fracture or static subluxation of the cervical spine. 3. Ankylosis of C5-C6 with moderate disc degenerative disease C7-T1. Electronically Signed   By: Jearld Lesch M.D.   On: 12/09/2022 13:33    EKG: Independently reviewed.  Sinus rhythm, rate 71, QTc 473.  No significant change from prior.  Assessment/Plan Principal Problem:   PNA (pneumonia) Active Problems:   Fall   Chronic diastolic CHF (congestive heart failure) (HCC)   Idiopathic chronic pancreatitis (HCC)   Essential hypertension   History of stroke   Chronic pain syndrome   Severe recurrent major depression with psychotic features (HCC)   Assessment and Plan: * PNA (pneumonia) Presenting with generalized weakness.  Afebrile, leukocytosis of 18.2.  Rules out for sepsis.  No problems swallowing. Not hypoxic. Ct chest- Heterogeneous ground-glass airspace opacity throughout the dependent right lower lobe, new compared to prior examination, consistent with infection or aspiration. - IV ceftriazone and azithromycin -1 Liter bolus given, continue LR 75cc/hr x 12hrs -Check COVID  Fall 2 falls today, 2/2 generalized weakness.  Eliquis on med list, but patient's is not aware if and why she is taking this medication.  I do not see documentation in chart as to why she would be on anticoagulation.  Head and cervical CT negative for acute abnormalities, thoracic and lumbar spine- Unchanged superior endplate wedge deformities of T11, T12, L1, and L3. -PT eval  COPD (chronic obstructive pulmonary disease) (HCC) Stable.  Reports she smokes about 4 cigarettes daily.   - Resume home regimen.  Chronic diastolic CHF  (congestive heart failure) (HCC) Stable and compensated.  Not on diuretics.  Last echo- TEE-  EF of 60 to 65%.  Severe recurrent major depression with psychotic features (HCC) Resume olanzapine  Chronic pain syndrome Resume buprenorphine  History of stroke 4+/5 strength in all extremities.  Resides in an assited living.  Essential hypertension Systolic 100-121. -Resume losartan in a.m.   DVT prophylaxis: Lovenox Code Status: DNR- Confirmed with patient at bedside Family Communication: None at bedside Disposition Plan: ~ 2 days Consults called: None Admission status: Obs Tele  I certify that at the point of admission it is my clinical judgment that the patient will require inpatient hospital care spanning beyond 2 midnights from the point of admission due to high intensity of service, high risk for further deterioration and high frequency of surveillance required.   Author: Onnie Boer, MD 12/09/2022 8:37 PM  For on call review [email or web address].

## 2022-12-09 NOTE — ED Notes (Signed)
Patient transported to CT 

## 2022-12-09 NOTE — Assessment & Plan Note (Addendum)
4+/5 strength in all extremities.  Resides in an assited living.

## 2022-12-09 NOTE — Assessment & Plan Note (Addendum)
Presenting with generalized weakness.  Afebrile, leukocytosis of 18.2.  Rules out for sepsis.  No problems swallowing. Not hypoxic. Ct chest- Heterogeneous ground-glass airspace opacity throughout the dependent right lower lobe, new compared to prior examination, consistent with infection or aspiration. - IV ceftriazone and azithromycin -1 Liter bolus given, continue LR 75cc/hr x 12hrs -Check COVID

## 2022-12-09 NOTE — Plan of Care (Signed)

## 2022-12-09 NOTE — ED Triage Notes (Signed)
[facility]. Fell twice within 20 min. Fall was unwitnessed. Pt stated to ems she hit lower back and head and denied LOC. Complaints of urine being dark and foul odor. EMS VS: 115/45, 158 CBG, 98.8, 88 bpm.

## 2022-12-09 NOTE — Assessment & Plan Note (Signed)
Stable and compensated.  Not on diuretics.  Last echo- TEE- EF of 60 to 65%.

## 2022-12-09 NOTE — Assessment & Plan Note (Signed)
2 falls today, 2/2 generalized weakness.  Eliquis on med list, but patient's is not aware if and why she is taking this medication.  I do not see documentation in chart as to why she would be on anticoagulation.  Head and cervical CT negative for acute abnormalities, thoracic and lumbar spine- Unchanged superior endplate wedge deformities of T11, T12, L1, and L3. -PT eval

## 2022-12-09 NOTE — Assessment & Plan Note (Signed)
Resume buprenorphine

## 2022-12-09 NOTE — Consult Note (Signed)
[facility] Customer service manager [facility]) Accountable Care Organization (ACO) [facility] Liaison Note  12/09/2022  [name] Feb 29, 1951 [id number]  Location: [facility] Liaison screened the patient remotely at [facility].  Insurance: [employer]   [name] is a 71 y.o. female who is a Primary Care Patient of Malen Gauze, Collie Siad., MD [facility]). The patient was screened for readmission hospitalization with noted extreme risk score for unplanned readmission risk with 1 IP/1 ED in 6 months.  The patient was assessed for potential [facility]) Care Management service needs for post hospital transition for care coordination. Review of patient's electronic medical record reveals patient was admitted for Acute Respiratory failure with hypoxia. Pt discharged to a ALF where the facility will continue to address the pt's needs.    [facility] Care Management/Population Health does not replace or interfere with any arrangements made by the Inpatient Transition of Care team.   For questions contact:   Elliot Cousin, RN, [facility] Liaison [place]   Population Health Office Hours MTWF  8:00 am-6:00 pm [phone] mobile [phone] [Office toll free line] Office Hours are M-F 8:30 - 5 pm [email or web address]

## 2022-12-09 NOTE — Assessment & Plan Note (Signed)
Stable.  Reports she smokes about 4 cigarettes daily.   - Resume home regimen.

## 2022-12-09 NOTE — ED Provider Notes (Addendum)
[facility] DEPARTMENT AT [facility] Provider Note   CSN: [id number] Arrival date & time: 12/09/22  1103     History  Chief Complaint  Patient presents with   [name] is a 72 y.o. female.   Fall  Patient presents for fall.  Medical history includes anemia, arthritis, COPD, fibromyalgia, GERD, CVA.  She had a recent admission for COPD exacerbation.  She resides in an assisted living facility.  This morning, she had 2 unwitnessed falls, approximately 20 minutes apart.  She states that she landed on her bottom and has since had low back pain.  She denies any other areas of discomfort.  She also states that she struck her head.  Per review of her facility paperwork, she is on Eliquis.     Home Medications Prior to Admission medications   Medication Sig Start Date End Date Taking? Authorizing Provider  acetaminophen (TYLENOL) 500 MG tablet Take 2 tablets (1,000 mg total) by mouth every 6 (six) hours as needed for mild pain. 11/15/20   Mancel Bale, MD  albuterol (VENTOLIN HFA) 108 (90 Base) MCG/ACT inhaler Inhale 1-2 puffs into the lungs every 6 (six) hours as needed for wheezing or shortness of breath. 02/28/22   [provider]  ascorbic acid (VITAMIN C) 500 MG tablet Take 500 mg by mouth daily.    [provider]  carvedilol (COREG) 6.25 MG tablet Take 1 tablet (6.25 mg total) by mouth 2 (two) times daily with a meal. 11/15/20   Mancel Bale, MD  Cholecalciferol 50 MCG (2000 UT) TABS Take 2,000 Units by mouth in the morning, at noon, in the evening, and at bedtime.    [provider]  donepezil (ARICEPT) 10 MG tablet Take 10 mg by mouth daily. 02/15/22   [provider]  doxepin (SINEQUAN) 50 MG capsule Take 1 capsule (50 mg total) by mouth daily. 03/08/22   Sherryll Burger, Pratik D, DO  ELIQUIS 5 MG TABS tablet Take 5 mg by mouth 2 (two) times daily. 11/27/22   [provider]  famotidine (PEPCID) 20 MG tablet  Take 20 mg by mouth daily. 11/27/22   [provider]  fluticasone (FLONASE) 50 MCG/ACT nasal spray Place 1 spray into both nostrils daily. 11/12/22   [provider]  ipratropium (ATROVENT) 0.03 % nasal spray Place 1 spray into both nostrils 2 (two) times daily.    [provider]  levocetirizine (XYZAL) 5 MG tablet Take 5 mg by mouth daily. 11/22/22   [provider]  lipase/protease/amylase (CREON) 36000 UNITS CPEP capsule Take 1 capsule (36,000 Units total) by mouth 3 (three) times daily before meals. 12/04/22   Tyrone Nine, MD  losartan (COZAAR) 100 MG tablet Take 100 mg by mouth daily. 02/15/22   [provider]  Magnesium Citrate 100 MG CAPS Take 1 capsule by mouth in the morning, at noon, and at bedtime.    [provider]  magnesium oxide (MAG-OX) 400 (240 Mg) MG tablet Take 1 tablet by mouth daily. 11/27/22   [provider]  mirtazapine (REMERON) 7.5 MG tablet Take 7.5 mg by mouth at bedtime. 02/20/22   [provider]  Multiple Vitamins-Minerals (THERA-TABS M) TABS Take 1 tablet by mouth daily. 11/27/22   [provider]  naloxone [facility]) nasal spray 4 mg/0.1 mL Place 1 spray into the nose once. 12/04/21   [provider]  nystatin (MYCOSTATIN) 100000 UNIT/ML suspension Take 5 mLs (500,000 Units total)  by mouth 4 (four) times daily for 14 days. Swish and spit 12/01/22 12/15/22  Carmel Sacramento A, PA-C  OLANZapine (ZYPREXA) 10 MG tablet Take 1 tablet (10 mg total) by mouth at bedtime. 03/08/22   Sherryll Burger, Pratik D, DO  Omega-3 Fatty Acids (FISH OIL) 1000 MG CAPS Take 1 capsule by mouth in the morning, at noon, and at bedtime. 11/27/22   [provider]  traZODone (DESYREL) 50 MG tablet Take 50 mg by mouth at bedtime as needed for sleep. 11/27/22   [provider]  TRELEGY ELLIPTA 100-62.5-25 MCG/ACT AEPB Inhale 1 puff into the lungs daily. 02/28/22   [provider]  Zinc Sulfate 220 (50  Zn) MG TABS Take 0.5 tablets by mouth 3 (three) times a week. 02/14/22   [provider]      Allergies    Nsaids, Tylenol [acetaminophen], Codeine, Tolmetin, and Tramadol    Review of Systems   Review of Systems  Musculoskeletal:  Positive for back pain.  All other systems reviewed and are negative.   Physical Exam Updated Vital Signs BP (!) 100/49 (BP Location: Left Arm)   Pulse 69   Temp 98.8 F (37.1 C) (Oral)   Resp 13   Ht 5\' 4"  (1.626 m)   Wt 46.2 kg   SpO2 94%   BMI 17.48 kg/m  Physical Exam Vitals and nursing note reviewed.  Constitutional:      General: She is not in acute distress.    Appearance: Normal appearance. She is well-developed. She is not ill-appearing, toxic-appearing or diaphoretic.  HENT:     Head: Normocephalic and atraumatic.     Right Ear: External ear normal.     Left Ear: External ear normal.     Nose: Nose normal.     Mouth/Throat:     Mouth: Mucous membranes are moist.  Eyes:     Extraocular Movements: Extraocular movements intact.     Conjunctiva/sclera: Conjunctivae normal.  Cardiovascular:     Rate and Rhythm: Normal rate and regular rhythm.  Pulmonary:     Effort: Pulmonary effort is normal. No respiratory distress.     Breath sounds: Normal breath sounds.  Chest:     Chest wall: No tenderness.  Abdominal:     General: There is no distension.     Palpations: Abdomen is soft.     Tenderness: There is no abdominal tenderness.  Musculoskeletal:        General: No swelling. Normal range of motion.     Cervical back: Normal range of motion and neck supple.  Skin:    General: Skin is warm and dry.     Capillary Refill: Capillary refill takes less than 2 seconds.     Coloration: Skin is not jaundiced or pale.  Neurological:     General: No focal deficit present.     Mental Status: She is alert.     Cranial Nerves: No cranial nerve deficit.     Sensory: No sensory deficit.     Motor: No weakness.     Coordination:  Coordination normal.  Psychiatric:        Mood and Affect: Mood normal.        Behavior: Behavior normal.        Thought Content: Thought content normal.        Judgment: Judgment normal.     ED Results / Procedures / Treatments   Labs (all labs ordered are listed, but only abnormal results are displayed) Labs Reviewed  COMPREHENSIVE METABOLIC PANEL - Abnormal; Notable for the following components:      Result Value   Glucose, Bld 118 (*)    Total Protein 6.2 (*)    Albumin 3.2 (*)    ALT 60 (*)    Alkaline Phosphatase 163 (*)    All other components within normal limits  CBC - Abnormal; Notable for the following components:   WBC 18.2 (*)    All other components within normal limits  MAGNESIUM  CK  URINALYSIS, W/ REFLEX TO CULTURE (INFECTION SUSPECTED)    EKG None  Radiology CT CHEST ABDOMEN PELVIS WO CONTRAST  Result Date: 12/09/2022 CLINICAL DATA:  Multiple falls, low back pain EXAM: CT CHEST, ABDOMEN AND PELVIS WITHOUT CONTRAST CT THORACIC AND LUMBAR SPINE WITHOUT CONTRAST TECHNIQUE: Multidetector CT imaging of the chest, abdomen and pelvis was performed following the standard protocol without IV contrast. Multidetector CT imaging of the thoracic and lumbar spine was performed following the standard protocol without IV contrast. RADIATION DOSE REDUCTION: This exam was performed according to the departmental dose-optimization program which includes automated exposure control, adjustment of the mA and/or kV according to patient size and/or use of iterative reconstruction technique. COMPARISON:  CT chest angiogram and abdomen pelvis, 12/01/2022, MR abdomen, 12/02/2022 FINDINGS: CT CHEST FINDINGS Cardiovascular: Aortic atherosclerosis. Normal heart size. No pericardial effusion. Mediastinum/Nodes: No enlarged mediastinal, hilar, or axillary lymph nodes. Thyroid gland, trachea, and esophagus demonstrate no significant findings. Lungs/Pleura: Diffuse bilateral bronchial wall  thickening. Heterogeneous ground-glass airspace opacity throughout the dependent right lower lobe, new compared to prior examination (series 6, image 43). No pleural effusion or pneumothorax. Musculoskeletal: No chest wall mass or suspicious osseous lesions identified. Osteopenia. CT ABDOMEN PELVIS FINDINGS Hepatobiliary: No focal liver abnormality is seen. Status post cholecystectomy. Unchanged biliary ductal dilatation. Pancreas: Scattered calcifications throughout the pancreas. Cystic lesions within the pancreas better detailed by recent prior MR. No pancreatic ductal dilatation or surrounding inflammatory changes. Spleen: Normal in size without significant abnormality. Adrenals/Urinary Tract: Adrenal glands are unremarkable. Multiple small bilateral nonobstructive renal calculi. No ureteral calculi or hydronephrosis. Numerous bilateral renal lesions of varying attenuation, previously characterized by MR as definitively benign simple and hemorrhagic or proteinaceous cysts, requiring no further follow-up or characterization. Bladder is unremarkable. Stomach/Bowel: Stomach is within normal limits. Appendix appears normal. No evidence of bowel wall thickening, distention, or inflammatory changes. Sigmoid diverticulosis. Moderate burden of stool throughout the colon and rectum. Vascular/Lymphatic: Aortic atherosclerosis. No enlarged abdominal or pelvic lymph nodes. Reproductive: Status post hysterectomy. Other: No abdominal wall hernia or abnormality. No ascites. Musculoskeletal: No acute osseous findings. CT THORACIC AND LUMBAR SPINE FINDINGS Alignment: Normal thoracic kyphosis. Normal lumbar lordosis. Vertebral bodies: Osteopenia. Unchanged superior endplate wedge deformities of T11, T12, L1, and L3. Disc spaces: Mild multilevel disc space height loss and osteophytosis throughout the thoracic and lumbar spine. Congenital ankylosis of L4-L5. Paraspinous soft tissues: Unremarkable. IMPRESSION: 1. Heterogeneous  ground-glass airspace opacity throughout the dependent right lower lobe, new compared to prior examination, consistent with infection or aspiration. 2. No noncontrast evidence of acute traumatic injury to the chest, abdomen, or pelvis. 3. Unchanged superior endplate wedge deformities of T11, T12, L1, and L3. 4. Osteopenia. 5. Nonobstructive bilateral nephrolithiasis. 6. Sigmoid diverticulosis without evidence of acute diverticulitis. Aortic Atherosclerosis (ICD10-I70.0). Electronically Signed   By: Jearld Lesch M.D.   On: 12/09/2022 13:46   CT T-SPINE NO CHARGE  Result Date: 12/09/2022 CLINICAL DATA:  Multiple falls, low back pain EXAM: CT CHEST, ABDOMEN AND PELVIS WITHOUT CONTRAST  CT THORACIC AND LUMBAR SPINE WITHOUT CONTRAST TECHNIQUE: Multidetector CT imaging of the chest, abdomen and pelvis was performed following the standard protocol without IV contrast. Multidetector CT imaging of the thoracic and lumbar spine was performed following the standard protocol without IV contrast. RADIATION DOSE REDUCTION: This exam was performed according to the departmental dose-optimization program which includes automated exposure control, adjustment of the mA and/or kV according to patient size and/or use of iterative reconstruction technique. COMPARISON:  CT chest angiogram and abdomen pelvis, 12/01/2022, MR abdomen, 12/02/2022 FINDINGS: CT CHEST FINDINGS Cardiovascular: Aortic atherosclerosis. Normal heart size. No pericardial effusion. Mediastinum/Nodes: No enlarged mediastinal, hilar, or axillary lymph nodes. Thyroid gland, trachea, and esophagus demonstrate no significant findings. Lungs/Pleura: Diffuse bilateral bronchial wall thickening. Heterogeneous ground-glass airspace opacity throughout the dependent right lower lobe, new compared to prior examination (series 6, image 43). No pleural effusion or pneumothorax. Musculoskeletal: No chest wall mass or suspicious osseous lesions identified. Osteopenia. CT ABDOMEN  PELVIS FINDINGS Hepatobiliary: No focal liver abnormality is seen. Status post cholecystectomy. Unchanged biliary ductal dilatation. Pancreas: Scattered calcifications throughout the pancreas. Cystic lesions within the pancreas better detailed by recent prior MR. No pancreatic ductal dilatation or surrounding inflammatory changes. Spleen: Normal in size without significant abnormality. Adrenals/Urinary Tract: Adrenal glands are unremarkable. Multiple small bilateral nonobstructive renal calculi. No ureteral calculi or hydronephrosis. Numerous bilateral renal lesions of varying attenuation, previously characterized by MR as definitively benign simple and hemorrhagic or proteinaceous cysts, requiring no further follow-up or characterization. Bladder is unremarkable. Stomach/Bowel: Stomach is within normal limits. Appendix appears normal. No evidence of bowel wall thickening, distention, or inflammatory changes. Sigmoid diverticulosis. Moderate burden of stool throughout the colon and rectum. Vascular/Lymphatic: Aortic atherosclerosis. No enlarged abdominal or pelvic lymph nodes. Reproductive: Status post hysterectomy. Other: No abdominal wall hernia or abnormality. No ascites. Musculoskeletal: No acute osseous findings. CT THORACIC AND LUMBAR SPINE FINDINGS Alignment: Normal thoracic kyphosis. Normal lumbar lordosis. Vertebral bodies: Osteopenia. Unchanged superior endplate wedge deformities of T11, T12, L1, and L3. Disc spaces: Mild multilevel disc space height loss and osteophytosis throughout the thoracic and lumbar spine. Congenital ankylosis of L4-L5. Paraspinous soft tissues: Unremarkable. IMPRESSION: 1. Heterogeneous ground-glass airspace opacity throughout the dependent right lower lobe, new compared to prior examination, consistent with infection or aspiration. 2. No noncontrast evidence of acute traumatic injury to the chest, abdomen, or pelvis. 3. Unchanged superior endplate wedge deformities of T11, T12,  L1, and L3. 4. Osteopenia. 5. Nonobstructive bilateral nephrolithiasis. 6. Sigmoid diverticulosis without evidence of acute diverticulitis. Aortic Atherosclerosis (ICD10-I70.0). Electronically Signed   By: Jearld Lesch M.D.   On: 12/09/2022 13:46   CT L-SPINE NO CHARGE  Result Date: 12/09/2022 CLINICAL DATA:  Multiple falls, low back pain EXAM: CT CHEST, ABDOMEN AND PELVIS WITHOUT CONTRAST CT THORACIC AND LUMBAR SPINE WITHOUT CONTRAST TECHNIQUE: Multidetector CT imaging of the chest, abdomen and pelvis was performed following the standard protocol without IV contrast. Multidetector CT imaging of the thoracic and lumbar spine was performed following the standard protocol without IV contrast. RADIATION DOSE REDUCTION: This exam was performed according to the departmental dose-optimization program which includes automated exposure control, adjustment of the mA and/or kV according to patient size and/or use of iterative reconstruction technique. COMPARISON:  CT chest angiogram and abdomen pelvis, 12/01/2022, MR abdomen, 12/02/2022 FINDINGS: CT CHEST FINDINGS Cardiovascular: Aortic atherosclerosis. Normal heart size. No pericardial effusion. Mediastinum/Nodes: No enlarged mediastinal, hilar, or axillary lymph nodes. Thyroid gland, trachea, and esophagus demonstrate no significant findings. Lungs/Pleura: Diffuse bilateral bronchial wall  thickening. Heterogeneous ground-glass airspace opacity throughout the dependent right lower lobe, new compared to prior examination (series 6, image 43). No pleural effusion or pneumothorax. Musculoskeletal: No chest wall mass or suspicious osseous lesions identified. Osteopenia. CT ABDOMEN PELVIS FINDINGS Hepatobiliary: No focal liver abnormality is seen. Status post cholecystectomy. Unchanged biliary ductal dilatation. Pancreas: Scattered calcifications throughout the pancreas. Cystic lesions within the pancreas better detailed by recent prior MR. No pancreatic ductal dilatation or  surrounding inflammatory changes. Spleen: Normal in size without significant abnormality. Adrenals/Urinary Tract: Adrenal glands are unremarkable. Multiple small bilateral nonobstructive renal calculi. No ureteral calculi or hydronephrosis. Numerous bilateral renal lesions of varying attenuation, previously characterized by MR as definitively benign simple and hemorrhagic or proteinaceous cysts, requiring no further follow-up or characterization. Bladder is unremarkable. Stomach/Bowel: Stomach is within normal limits. Appendix appears normal. No evidence of bowel wall thickening, distention, or inflammatory changes. Sigmoid diverticulosis. Moderate burden of stool throughout the colon and rectum. Vascular/Lymphatic: Aortic atherosclerosis. No enlarged abdominal or pelvic lymph nodes. Reproductive: Status post hysterectomy. Other: No abdominal wall hernia or abnormality. No ascites. Musculoskeletal: No acute osseous findings. CT THORACIC AND LUMBAR SPINE FINDINGS Alignment: Normal thoracic kyphosis. Normal lumbar lordosis. Vertebral bodies: Osteopenia. Unchanged superior endplate wedge deformities of T11, T12, L1, and L3. Disc spaces: Mild multilevel disc space height loss and osteophytosis throughout the thoracic and lumbar spine. Congenital ankylosis of L4-L5. Paraspinous soft tissues: Unremarkable. IMPRESSION: 1. Heterogeneous ground-glass airspace opacity throughout the dependent right lower lobe, new compared to prior examination, consistent with infection or aspiration. 2. No noncontrast evidence of acute traumatic injury to the chest, abdomen, or pelvis. 3. Unchanged superior endplate wedge deformities of T11, T12, L1, and L3. 4. Osteopenia. 5. Nonobstructive bilateral nephrolithiasis. 6. Sigmoid diverticulosis without evidence of acute diverticulitis. Aortic Atherosclerosis (ICD10-I70.0). Electronically Signed   By: Jearld Lesch M.D.   On: 12/09/2022 13:46   CT HEAD WO CONTRAST  Result Date:  12/09/2022 CLINICAL DATA:  Multiple falls, pain EXAM: CT HEAD WITHOUT CONTRAST CT CERVICAL SPINE WITHOUT CONTRAST TECHNIQUE: Multidetector CT imaging of the head and cervical spine was performed following the standard protocol without intravenous contrast. Multiplanar CT image reconstructions of the cervical spine were also generated. RADIATION DOSE REDUCTION: This exam was performed according to the departmental dose-optimization program which includes automated exposure control, adjustment of the mA and/or kV according to patient size and/or use of iterative reconstruction technique. COMPARISON:  None Available. FINDINGS: CT HEAD FINDINGS Brain: No evidence of acute infarction, hemorrhage, hydrocephalus, extra-axial collection or mass lesion/mass effect. Mild periventricular white matter hypodensity. Vascular: No hyperdense vessel or unexpected calcification. Skull: Normal. Negative for fracture or focal lesion. Sinuses/Orbits: No acute finding. Other: None. CT CERVICAL SPINE FINDINGS Alignment: Degenerative straightening and reversal of the normal cervical lordosis. Skull base and vertebrae: No acute fracture. No primary bone lesion or focal pathologic process. Soft tissues and spinal canal: No prevertebral fluid or swelling. No visible canal hematoma. Disc levels: Ankylosis of C5-C6 with moderate disc degenerative disease C7-T1. Upper chest: Negative. Other: None. IMPRESSION: 1. No acute intracranial pathology. Mild small-vessel white matter disease. 2. No fracture or static subluxation of the cervical spine. 3. Ankylosis of C5-C6 with moderate disc degenerative disease C7-T1. Electronically Signed   By: Jearld Lesch M.D.   On: 12/09/2022 13:33   CT CERVICAL SPINE WO CONTRAST  Result Date: 12/09/2022 CLINICAL DATA:  Multiple falls, pain EXAM: CT HEAD WITHOUT CONTRAST CT CERVICAL SPINE WITHOUT CONTRAST TECHNIQUE: Multidetector CT imaging of the head and cervical  spine was performed following the standard  protocol without intravenous contrast. Multiplanar CT image reconstructions of the cervical spine were also generated. RADIATION DOSE REDUCTION: This exam was performed according to the departmental dose-optimization program which includes automated exposure control, adjustment of the mA and/or kV according to patient size and/or use of iterative reconstruction technique. COMPARISON:  None Available. FINDINGS: CT HEAD FINDINGS Brain: No evidence of acute infarction, hemorrhage, hydrocephalus, extra-axial collection or mass lesion/mass effect. Mild periventricular white matter hypodensity. Vascular: No hyperdense vessel or unexpected calcification. Skull: Normal. Negative for fracture or focal lesion. Sinuses/Orbits: No acute finding. Other: None. CT CERVICAL SPINE FINDINGS Alignment: Degenerative straightening and reversal of the normal cervical lordosis. Skull base and vertebrae: No acute fracture. No primary bone lesion or focal pathologic process. Soft tissues and spinal canal: No prevertebral fluid or swelling. No visible canal hematoma. Disc levels: Ankylosis of C5-C6 with moderate disc degenerative disease C7-T1. Upper chest: Negative. Other: None. IMPRESSION: 1. No acute intracranial pathology. Mild small-vessel white matter disease. 2. No fracture or static subluxation of the cervical spine. 3. Ankylosis of C5-C6 with moderate disc degenerative disease C7-T1. Electronically Signed   By: Jearld Lesch M.D.   On: 12/09/2022 13:33    Procedures Procedures    Medications Ordered in ED Medications  cefTRIAXone (ROCEPHIN) 1 g in sodium chloride 0.9 % 100 mL IVPB (1 g Intravenous New Bag/Given 12/09/22 1437)  azithromycin (ZITHROMAX) 500 mg in sodium chloride 0.9 % 250 mL IVPB (has no administration in time range)  lactated ringers bolus 500 mL (has no administration in time range)  lactated ringers bolus 500 mL (has no administration in time range)  acetaminophen (TYLENOL) tablet 650 mg (650 mg Oral  Given 12/09/22 1255)    ED Course/ Medical Decision Making/ A&P                                 Medical Decision Making Amount and/or Complexity of Data Reviewed Labs: ordered. Radiology: ordered.  Risk OTC drugs. Decision regarding hospitalization.   This patient presents to the ED for concern of fall, this involves an extensive number of treatment options, and is a complaint that carries with it a high risk of complications and morbidity.  The differential diagnosis includes acute injuries, weakness secondary to infection, metabolic disturbances, polypharmacy, deconditioning   Co morbidities that complicate the patient evaluation  anemia, arthritis, COPD, fibromyalgia, GERD, CVA   Additional history obtained:  Additional history obtained from EMS External records from outside source obtained and reviewed including EMR   Lab Tests:  I Ordered, and personally interpreted labs.  The pertinent results include: Leukocytosis present.  Hemoglobin is normal.  Kidney function and electrolytes are normal.   Imaging Studies ordered:  I ordered imaging studies including CT imaging of head, cervical spine, chest, abdomen, pelvis, T-spine, L-spine I independently visualized and interpreted imaging which showed no acute injuries; she does have evidence of right lower lobe pneumonia. I agree with the radiologist interpretation   Cardiac Monitoring: / EKG:  The patient was maintained on a cardiac monitor.  I personally viewed and interpreted the cardiac monitored which showed an underlying rhythm of: Sinus rhythm  Problem List / ED Course / Critical interventions / Medication management  Patient presenting by EMS following 2 unwitnessed falls today.  Patient states that she struck her bottom and her head.  She has since had pain in her low back.  On exam,  she has no focal neurologic deficits.  She has no external evidence of trauma.  She is on Eliquis.  Trauma workup was initiated.   Tylenol was given for analgesia.  On CT imaging, no acute injuries were identified.  She does have old, unchanged superior endplate wedge deformities in lower thoracic and lumbar spine.  She was found to have groundglass airspace opacities throughout right lower lobe concerning for infection and/or pneumonia.  This would explain her weakness.  Lab work is notable for leukocytosis which also is consistent with pneumonia, however, she has been on steroids since her discharge 5 days ago which may also be contributing to leukocytosis.  Today, antibiotics were initiated.  Given her associated weakness, patient to be admitted.  I spoke with hospitalist, Dr. Gwenlyn Perking, who did have concerns about some abnormal blood pressure readings.  Patient was fitted with appropriate cuff.  Pulse pressure remains wide but she is normotensive.  Hospitalist was paged again.  Callback was pending at time of signout.  Care of patient was signed out to oncoming ED provider. I ordered medication including Tylenol for analgesia; ceftriaxone and azithromycin for pneumonia Reevaluation of the patient after these medicines showed that the patient improved I have reviewed the patients home medicines and have made adjustments as needed   Social Determinants of Health:  Resides in assisted living facility         Final Clinical Impression(s) / ED Diagnoses Final diagnoses:  Pneumonia of right lower lobe due to infectious organism  Fall, initial encounter  Generalized weakness    Rx / DC Orders ED Discharge Orders     None            Gloris Manchester, MD 12/09/22 1456    Gloris Manchester, MD 12/09/22 1609

## 2022-12-09 NOTE — Assessment & Plan Note (Signed)
Resume olanzapine

## 2022-12-10 DIAGNOSIS — J189 Pneumonia, unspecified organism: Secondary | ICD-10-CM | POA: Diagnosis not present

## 2022-12-10 DIAGNOSIS — K861 Other chronic pancreatitis: Secondary | ICD-10-CM

## 2022-12-10 DIAGNOSIS — R531 Weakness: Secondary | ICD-10-CM

## 2022-12-10 DIAGNOSIS — F333 Major depressive disorder, recurrent, severe with psychotic symptoms: Secondary | ICD-10-CM | POA: Diagnosis not present

## 2022-12-10 DIAGNOSIS — G894 Chronic pain syndrome: Secondary | ICD-10-CM | POA: Diagnosis not present

## 2022-12-10 DIAGNOSIS — I5032 Chronic diastolic (congestive) heart failure: Secondary | ICD-10-CM | POA: Diagnosis not present

## 2022-12-10 DIAGNOSIS — J181 Lobar pneumonia, unspecified organism: Secondary | ICD-10-CM | POA: Diagnosis not present

## 2022-12-10 DIAGNOSIS — J449 Chronic obstructive pulmonary disease, unspecified: Secondary | ICD-10-CM | POA: Diagnosis not present

## 2022-12-10 DIAGNOSIS — Z8673 Personal history of transient ischemic attack (TIA), and cerebral infarction without residual deficits: Secondary | ICD-10-CM | POA: Diagnosis not present

## 2022-12-10 DIAGNOSIS — I10 Essential (primary) hypertension: Secondary | ICD-10-CM | POA: Diagnosis not present

## 2022-12-10 LAB — CBC
HCT: 35.9 % — ABNORMAL LOW (ref 36.0–46.0)
Hemoglobin: 11.4 g/dL — ABNORMAL LOW (ref 12.0–15.0)
MCH: 29.6 pg (ref 26.0–34.0)
MCHC: 31.8 g/dL (ref 30.0–36.0)
MCV: 93.2 fL (ref 80.0–100.0)
Platelets: 350 10*3/uL (ref 150–400)
RBC: 3.85 MIL/uL — ABNORMAL LOW (ref 3.87–5.11)
RDW: 14.8 % (ref 11.5–15.5)
WBC: 12.7 10*3/uL — ABNORMAL HIGH (ref 4.0–10.5)
nRBC: 0 % (ref 0.0–0.2)

## 2022-12-10 LAB — BASIC METABOLIC PANEL
Anion gap: 7 (ref 5–15)
BUN: 18 mg/dL (ref 8–23)
CO2: 29 mmol/L (ref 22–32)
Calcium: 8.5 mg/dL — ABNORMAL LOW (ref 8.9–10.3)
Chloride: 103 mmol/L (ref 98–111)
Creatinine, Ser: 0.62 mg/dL (ref 0.44–1.00)
GFR, Estimated: >60 mL/min (ref >60)
Glucose, Bld: 73 mg/dL (ref 70–99)
Potassium: 3.5 mmol/L (ref 3.5–5.1)
Sodium: 139 mmol/L (ref 135–145)

## 2022-12-10 MED ORDER — AMOXICILLIN-POT CLAVULANATE 875-125 MG PO TABS: 1.0000 | ORAL_TABLET | Freq: Two times a day (BID) | ORAL | 0 refills | Status: AC

## 2022-12-10 MED ORDER — DM-GUAIFENESIN ER 30-600 MG PO TB12: 1.0000 | ORAL_TABLET | Freq: Two times a day (BID) | ORAL | 0 refills | Status: DC

## 2022-12-10 MED ORDER — PANCRELIPASE (LIP-PROT-AMYL) 36000-114000 UNITS PO CPEP: 36000.0000 [IU] | ORAL_CAPSULE | Freq: Three times a day (TID) | ORAL | 2 refills | Status: DC

## 2022-12-10 MED ORDER — LOSARTAN POTASSIUM 100 MG PO TABS: 50.0000 mg | ORAL_TABLET | Freq: Every day | ORAL | Status: AC

## 2022-12-10 NOTE — Plan of Care (Signed)
  Problem: Acute Rehab PT Goals(only PT should resolve) Goal: Pt Will Go Supine/Side To Sit Outcome: Progressing Flowsheets (Taken 12/10/2022 1348) Pt will go Supine/Side to Sit:  Independently  with modified independence Goal: Patient Will Transfer Sit To/From Stand Outcome: Progressing Flowsheets (Taken 12/10/2022 1348) Patient will transfer sit to/from stand:  with supervision  with modified independence Goal: Pt Will Transfer Bed To Chair/Chair To Bed Outcome: Progressing Flowsheets (Taken 12/10/2022 1348) Pt will Transfer Bed to Chair/Chair to Bed:  with supervision  with modified independence Goal: Pt Will Ambulate Outcome: Progressing Flowsheets (Taken 12/10/2022 1348) Pt will Ambulate:  100 feet  with supervision  with modified independence  with rolling walker   1:49 PM, 12/10/22 [name], MPT Physical Therapist with [facility] [phone] office [phone] mobile phone

## 2022-12-10 NOTE — TOC Transition Note (Signed)
Transition of Care [facility]) - CM/SW Discharge Note   Patient Details  Name: [name] MRN: [id number] Date of Birth: 10/19/1950  Transition of Care [facility]) CM/SW Contact:  Villa Herb, LCSWA Phone Number: 12/10/2022, 3:00 PM   Clinical Narrative:    CSW spoke to [facility] staff member Ty to update that pt is medically ready for D/C back to facility. Pt will not need a new Fl2 due to being out of building less than 24 hours. CSW sent D/C clinicals to ALF at their request via secure email. CSW called for [facility], rider waiver on file from last admission. CSW updated RN of plans for D/C and ride being called. TOC signing off.   Final next level of care: Assisted Living Barriers to Discharge: Barriers Resolved   Patient Goals and CMS Choice CMS Medicare.gov Compare Post Acute Care list provided to:: Patient Choice offered to / list presented to : Patient  Discharge Placement                         Discharge Plan and Services Additional resources added to the After Visit Summary for                            [facility] Arranged: PT [facility] Agency: [facility] Date [facility] Agency Contacted: 12/10/22   Representative spoke with at [facility] Agency: Kandee Keen  Social Determinants of Health (SDOH) Interventions SDOH Screenings   Food Insecurity: No Food Insecurity (12/09/2022)  Housing: Low Risk  (12/09/2022)  Transportation Needs: No Transportation Needs (12/09/2022)  Utilities: Not At Risk (12/09/2022)  Social Connections: Unknown (07/21/2021)   Received from [facility]  Stress: Stress Concern Present (05/10/2020)   Received from [facility], [facility]  Tobacco Use: High Risk (12/09/2022)     Readmission Risk Interventions    12/03/2022   10:10 AM 03/01/2022   11:53 AM  Readmission Risk Prevention Plan  Transportation Screening Complete Complete  Home Care Screening Complete Complete  Medication Review (RN CM)  Complete

## 2022-12-10 NOTE — Discharge Summary (Signed)
Physician Discharge Summary   Patient: [name] MRN: [id number] DOB: 1950/12/15  Admit date:     12/09/2022  Discharge date:   Discharge Physician: Vassie Loll   PCP: Frederich Chick., MD   Recommendations at discharge:  Repeat basic metabolic panel to follow electrolytes and renal function Repeat CBC to follow hemoglobin trend and WBC stability Reassess blood pressure and adjust antihypertensive treatment as needed Make sure patient follow-up with gastroenterology as previously instructed Repeat chest x-ray in 6 weeks to assure resolution of infiltrates.  Discharge Diagnoses: Principal Problem:   PNA (pneumonia) Active Problems:   Fall   Chronic diastolic CHF (congestive heart failure) (HCC)   COPD (chronic obstructive pulmonary disease) (HCC)   Idiopathic chronic pancreatitis (HCC)   Essential hypertension   History of stroke   Chronic pain syndrome   Severe recurrent major depression with psychotic features [facility])   Generalized weakness  Brief Hospital admission narrative: As per H&P written by Dr. Mariea Clonts on 12/09/22 [name] is a 72 y.o. female with medical history significant for stroke, chronic pancreatitis, COPD, stage IV decubitus ulcer, diastolic CHF. Patient presented to the ED from assisted living facility with reports of 2 falls today, and generalized weakness.  Patient tells me she felt weak all over and this resulted in the fall.  No difficulty breathing.  No cough.  No chest pain.  No dizziness.  No vomiting.  No loose stools.  Reports urinary symptoms of frequency and dysuria.  She denies problems with swallowing She reports chronic unchanged low back pain, chronic unchanged abdominal pain related to her chronic pancreatitis.   Recently hospitalized 9/22 to 9/26 for COPD exacerbation, hypoxic and hypercarbic respiratory failure, also had elevated LFTs, she was to follow-up outpatient for EUS.  She had completed a 6-week course of IV vancomycin-  9/3 for sacral osteomyelitis from stage IV decubitus ulcer.    ED Course: Tmax 98.8.  Heart rate 61-69.  Respirate rate 11-15.  Blood pressure systolic 100-124.  O2 sats greater than 93% on room air.  Leukocytosis of 18.2.  Head and cervical CT, pelvic xray negative for acute abnormality.   - CT lumbar and thoracic spine-  Unchanged superior endplate wedge deformities of T11, T12, L1, and L3. - CTA chest abdomen and pelvis without contrast- Heterogeneous ground-glass airspace opacity throughout the dependent right lower lobe, new compared to prior examination, consistent with infection or aspiration.  Assessment and Plan: * PNA (pneumonia) Presenting with generalized weakness.  Afebrile, leukocytosis of 18.2.  Rules out for sepsis.  No problems swallowing. Not hypoxic. Ct chest- Heterogeneous ground-glass airspace opacity throughout the dependent right lower lobe, new compared to prior examination, consistent with infection or aspiration. -COVID PCR negative -WBCs adequately trending down at discharge (recent use of his steroid most likely participating in elevation) -Patient is afebrile and not requiring oxygen supplementation. -Discharged home on oral antibiotics to complete therapy -Repeat chest x-ray in 6 weeks to assure resolution of infiltrates.  Fall/physical deconditioning 2 falls today, 2/2 generalized weakness.  Eliquis on med list, but patient's is not aware if and why she is taking this medication.  I do not see documentation in chart as to why she would be on anticoagulation.  Head and cervical CT negative for acute abnormalities, thoracic and lumbar spine- Unchanged superior endplate wedge deformities of T11, T12, L1, and L3. -PT eval has seen patient with recommendation for home health services at discharge.  COPD (chronic obstructive pulmonary disease) (HCC) -Stable. -No  wheezing appreciated on exam -Complete tobacco cessation recommended -Continue home bronchodilator  management.  Chronic diastolic CHF (congestive heart failure) (HCC) -Stable and compensated.   -Not on diuretics as an outpatient.  -Last echo- TEE- EF of 60 to 65%. -Continue to follow low-sodium diet and daily weights -Continue patient follow-up with PCP.  Severe recurrent major depression/anxiety with psychotic features (HCC) -Resume home antidepressant/anxiolytic regimen -No suicidal ideation or hallucinations.  Chronic pain syndrome -Continue home analgesic therapy  History of stroke -No new focal deficit appreciated -Resides in an assited living. -Home health PT arranged at discharge.  Essential hypertension -Continue adjusted dose of antihypertensive agent -Heart healthy diet discussed with patient.  Chronic pancreatitis -Continue patient follow-up with gastroenterology service -Continue the use of Creon.  Stage IV decubitus ulcer -No acute drainage or signs of superimposed infection appreciated -Continue patient follow-up with wound care services. -Continue constant repositioning  Consultants: None Procedures performed: See below for x-ray reports. Disposition: Assisted living Diet recommendation: Heart healthy/low-sodium diet.  DISCHARGE MEDICATION: Allergies as of 12/10/2022       Reactions   Nsaids Other (See Comments), Nausea And Vomiting   Stomach ulcers   Tylenol [acetaminophen] Other (See Comments)   Inflammed Liver   Codeine Nausea And Vomiting   Tolmetin Other (See Comments)   Stomach ulcers   Tramadol Other (See Comments)   GI UPSET Per ALF        Medication List     STOP taking these medications    doxepin 50 MG capsule Commonly known as: SINEQUAN   Eliquis 5 MG Tabs tablet Generic drug: apixaban   famotidine 20 MG tablet Commonly known as: PEPCID   ipratropium 0.03 % nasal spray Commonly known as: ATROVENT   traMADol 50 MG tablet Commonly known as: ULTRAM       TAKE these medications    albuterol 108 (90 Base)  MCG/ACT inhaler Commonly known as: VENTOLIN HFA Inhale 1-2 puffs into the lungs every 6 (six) hours as needed for wheezing or shortness of breath.   amoxicillin-clavulanate 875-125 MG tablet Commonly known as: AUGMENTIN Take 1 tablet by mouth 2 (two) times daily for 6 days.   ascorbic acid 500 MG tablet Commonly known as: VITAMIN C Take 500 mg by mouth daily.   buprenorphine 8 MG Subl SL tablet Commonly known as: SUBUTEX Place 4 mg under the tongue every 6 (six) hours.   dextromethorphan-guaiFENesin 30-600 MG 12hr tablet Commonly known as: MUCINEX DM Take 1 tablet by mouth 2 (two) times daily.   donepezil 10 MG tablet Commonly known as: ARICEPT Take 10 mg by mouth daily.   fluticasone 50 MCG/ACT nasal spray Commonly known as: FLONASE Place 1 spray into both nostrils daily.   levocetirizine 5 MG tablet Commonly known as: XYZAL Take 5 mg by mouth daily.   lipase/protease/amylase 19147 UNITS Cpep capsule Commonly known as: CREON Take 1 capsule (36,000 Units total) by mouth 3 (three) times daily before meals.   LORazepam 0.5 MG tablet Commonly known as: ATIVAN Take 0.5 mg by mouth 2 (two) times daily. Do not administer AFTER 3   losartan 100 MG tablet Commonly known as: COZAAR Take 0.5 tablets (50 mg total) by mouth daily. What changed: how much to take   MAGNESIUM CITRATE PO Take 1 capsule by mouth at bedtime.   magnesium oxide 400 (240 Mg) MG tablet Commonly known as: MAG-OX Take 1 tablet by mouth daily.   mirtazapine 7.5 MG tablet Commonly known as: REMERON Take 7.5 mg by  mouth at bedtime.   naloxone 4 MG/0.1ML Liqd nasal spray kit Commonly known as: NARCAN Place 1 spray into the nose once.   OLANZapine 10 MG tablet Commonly known as: ZyPREXA Take 1 tablet (10 mg total) by mouth at bedtime.   Thera-Tabs M Tabs Take 1 tablet by mouth daily.   traZODone 50 MG tablet Commonly known as: DESYREL Take 50 mg by mouth at bedtime as needed for sleep.    Trelegy Ellipta 100-62.5-25 MCG/ACT Aepb Generic drug: Fluticasone-Umeclidin-Vilant Inhale 1 puff into the lungs daily.   VITAMIN D-3 PO Take 1 tablet by mouth 4 (four) times daily.        Follow-up Information     Frederich Chick., MD. Schedule an appointment as soon as possible for a visit in 10 day(s).   Specialty: Family Medicine Contact information: [address] [place] [address] [phone]                Discharge Exam: Ceasar Mons Weights   12/09/22 1120  Weight: 46.2 kg   General exam: Alert, awake, following commands appropriately and in no acute distress.  Good saturation on room air appreciated. Respiratory system: Positive scattered rhonchi; no using accessory muscles. Cardiovascular system:RRR. No rubs or gallops; no JVD. Gastrointestinal system: Abdomen is nondistended, soft and nontender. No organomegaly or masses felt. Normal bowel sounds heard. Central nervous system: No new focal neurological deficits. Extremities: No cyanosis or clubbing. Skin: No petechiae; stage IV decubitus ulcer without signs of superimposed infection or active drainage appreciated. Psychiatry: Judgement and insight appear normal. Mood & affect appropriate.    Condition at discharge: Stable and improved.  The results of significant diagnostics from this hospitalization (including imaging, microbiology, ancillary and laboratory) are listed below for reference.   Imaging Studies: DG Pelvis Portable  Result Date: 12/09/2022 CLINICAL DATA:  Larey Seat twice within 20 minutes today.  Pain. EXAM: PORTABLE PELVIS 1-2 VIEWS COMPARISON:  CT abdomen and pelvis 12/01/2022 FINDINGS: There is diffuse decreased bone mineralization. Mild bilateral femoroacetabular joint space narrowing. Mild bilateral femoral head-neck junction degenerative spurs. Mild pubic symphysis joint space narrowing and chondrocalcinosis. The bilateral sacroiliac joint spaces are maintained. No acute fracture is  seen. No dislocation. IMPRESSION: 1. No acute fracture. 2. Mild bilateral femoroacetabular osteoarthritis. Electronically Signed   By: Neita Garnet M.D.   On: 12/09/2022 15:20   DG Chest Port 1 View  Result Date: 12/09/2022 CLINICAL DATA:  Trauma.  Fell twice within 20 minutes. EXAM: PORTABLE CHEST 1 VIEW COMPARISON:  Chest radiographs 03/05/2022, 02/22/2022, 10/30/2020; CT chest 12/09/2022 and 12/01/2022 FINDINGS: Cardiac silhouette and mediastinal contours are within normal limits. Moderate calcification within the aortic arch. Mild to moderate right lower lung interstitial thickening is similar to 03/05/2022. There is increased ground-glass opacity in this region on today's CT chest compared to 12/01/2022. The left lung appears clear. There are again lucency throughout the bilateral lungs consistent with chronic emphysematous changes. Flattening of the diaphragms and mild hyperinflation. No pneumothorax.  No acute skeletal abnormality. IMPRESSION: 1. Chronic emphysematous changes. 2. Mild to moderate right lower lung interstitial thickening is similar to 03/05/2022. There is increased ground-glass opacity in this region on today's CT chest compared to 12/01/2022. This may represent superimposed atelectasis or infection. Electronically Signed   By: Neita Garnet M.D.   On: 12/09/2022 15:18   CT CHEST ABDOMEN PELVIS WO CONTRAST  Result Date: 12/09/2022 CLINICAL DATA:  Multiple falls, low back pain EXAM: CT CHEST, ABDOMEN AND PELVIS WITHOUT CONTRAST CT  THORACIC AND LUMBAR SPINE WITHOUT CONTRAST TECHNIQUE: Multidetector CT imaging of the chest, abdomen and pelvis was performed following the standard protocol without IV contrast. Multidetector CT imaging of the thoracic and lumbar spine was performed following the standard protocol without IV contrast. RADIATION DOSE REDUCTION: This exam was performed according to the departmental dose-optimization program which includes automated exposure control, adjustment  of the mA and/or kV according to patient size and/or use of iterative reconstruction technique. COMPARISON:  CT chest angiogram and abdomen pelvis, 12/01/2022, MR abdomen, 12/02/2022 FINDINGS: CT CHEST FINDINGS Cardiovascular: Aortic atherosclerosis. Normal heart size. No pericardial effusion. Mediastinum/Nodes: No enlarged mediastinal, hilar, or axillary lymph nodes. Thyroid gland, trachea, and esophagus demonstrate no significant findings. Lungs/Pleura: Diffuse bilateral bronchial wall thickening. Heterogeneous ground-glass airspace opacity throughout the dependent right lower lobe, new compared to prior examination (series 6, image 43). No pleural effusion or pneumothorax. Musculoskeletal: No chest wall mass or suspicious osseous lesions identified. Osteopenia. CT ABDOMEN PELVIS FINDINGS Hepatobiliary: No focal liver abnormality is seen. Status post cholecystectomy. Unchanged biliary ductal dilatation. Pancreas: Scattered calcifications throughout the pancreas. Cystic lesions within the pancreas better detailed by recent prior MR. No pancreatic ductal dilatation or surrounding inflammatory changes. Spleen: Normal in size without significant abnormality. Adrenals/Urinary Tract: Adrenal glands are unremarkable. Multiple small bilateral nonobstructive renal calculi. No ureteral calculi or hydronephrosis. Numerous bilateral renal lesions of varying attenuation, previously characterized by MR as definitively benign simple and hemorrhagic or proteinaceous cysts, requiring no further follow-up or characterization. Bladder is unremarkable. Stomach/Bowel: Stomach is within normal limits. Appendix appears normal. No evidence of bowel wall thickening, distention, or inflammatory changes. Sigmoid diverticulosis. Moderate burden of stool throughout the colon and rectum. Vascular/Lymphatic: Aortic atherosclerosis. No enlarged abdominal or pelvic lymph nodes. Reproductive: Status post hysterectomy. Other: No abdominal wall  hernia or abnormality. No ascites. Musculoskeletal: No acute osseous findings. CT THORACIC AND LUMBAR SPINE FINDINGS Alignment: Normal thoracic kyphosis. Normal lumbar lordosis. Vertebral bodies: Osteopenia. Unchanged superior endplate wedge deformities of T11, T12, L1, and L3. Disc spaces: Mild multilevel disc space height loss and osteophytosis throughout the thoracic and lumbar spine. Congenital ankylosis of L4-L5. Paraspinous soft tissues: Unremarkable. IMPRESSION: 1. Heterogeneous ground-glass airspace opacity throughout the dependent right lower lobe, new compared to prior examination, consistent with infection or aspiration. 2. No noncontrast evidence of acute traumatic injury to the chest, abdomen, or pelvis. 3. Unchanged superior endplate wedge deformities of T11, T12, L1, and L3. 4. Osteopenia. 5. Nonobstructive bilateral nephrolithiasis. 6. Sigmoid diverticulosis without evidence of acute diverticulitis. Aortic Atherosclerosis (ICD10-I70.0). Electronically Signed   By: Jearld Lesch M.D.   On: 12/09/2022 13:46   CT T-SPINE NO CHARGE  Result Date: 12/09/2022 CLINICAL DATA:  Multiple falls, low back pain EXAM: CT CHEST, ABDOMEN AND PELVIS WITHOUT CONTRAST CT THORACIC AND LUMBAR SPINE WITHOUT CONTRAST TECHNIQUE: Multidetector CT imaging of the chest, abdomen and pelvis was performed following the standard protocol without IV contrast. Multidetector CT imaging of the thoracic and lumbar spine was performed following the standard protocol without IV contrast. RADIATION DOSE REDUCTION: This exam was performed according to the departmental dose-optimization program which includes automated exposure control, adjustment of the mA and/or kV according to patient size and/or use of iterative reconstruction technique. COMPARISON:  CT chest angiogram and abdomen pelvis, 12/01/2022, MR abdomen, 12/02/2022 FINDINGS: CT CHEST FINDINGS Cardiovascular: Aortic atherosclerosis. Normal heart size. No pericardial effusion.  Mediastinum/Nodes: No enlarged mediastinal, hilar, or axillary lymph nodes. Thyroid gland, trachea, and esophagus demonstrate no significant findings. Lungs/Pleura: Diffuse bilateral bronchial wall  thickening. Heterogeneous ground-glass airspace opacity throughout the dependent right lower lobe, new compared to prior examination (series 6, image 43). No pleural effusion or pneumothorax. Musculoskeletal: No chest wall mass or suspicious osseous lesions identified. Osteopenia. CT ABDOMEN PELVIS FINDINGS Hepatobiliary: No focal liver abnormality is seen. Status post cholecystectomy. Unchanged biliary ductal dilatation. Pancreas: Scattered calcifications throughout the pancreas. Cystic lesions within the pancreas better detailed by recent prior MR. No pancreatic ductal dilatation or surrounding inflammatory changes. Spleen: Normal in size without significant abnormality. Adrenals/Urinary Tract: Adrenal glands are unremarkable. Multiple small bilateral nonobstructive renal calculi. No ureteral calculi or hydronephrosis. Numerous bilateral renal lesions of varying attenuation, previously characterized by MR as definitively benign simple and hemorrhagic or proteinaceous cysts, requiring no further follow-up or characterization. Bladder is unremarkable. Stomach/Bowel: Stomach is within normal limits. Appendix appears normal. No evidence of bowel wall thickening, distention, or inflammatory changes. Sigmoid diverticulosis. Moderate burden of stool throughout the colon and rectum. Vascular/Lymphatic: Aortic atherosclerosis. No enlarged abdominal or pelvic lymph nodes. Reproductive: Status post hysterectomy. Other: No abdominal wall hernia or abnormality. No ascites. Musculoskeletal: No acute osseous findings. CT THORACIC AND LUMBAR SPINE FINDINGS Alignment: Normal thoracic kyphosis. Normal lumbar lordosis. Vertebral bodies: Osteopenia. Unchanged superior endplate wedge deformities of T11, T12, L1, and L3. Disc spaces: Mild  multilevel disc space height loss and osteophytosis throughout the thoracic and lumbar spine. Congenital ankylosis of L4-L5. Paraspinous soft tissues: Unremarkable. IMPRESSION: 1. Heterogeneous ground-glass airspace opacity throughout the dependent right lower lobe, new compared to prior examination, consistent with infection or aspiration. 2. No noncontrast evidence of acute traumatic injury to the chest, abdomen, or pelvis. 3. Unchanged superior endplate wedge deformities of T11, T12, L1, and L3. 4. Osteopenia. 5. Nonobstructive bilateral nephrolithiasis. 6. Sigmoid diverticulosis without evidence of acute diverticulitis. Aortic Atherosclerosis (ICD10-I70.0). Electronically Signed   By: Jearld Lesch M.D.   On: 12/09/2022 13:46   CT L-SPINE NO CHARGE  Result Date: 12/09/2022 CLINICAL DATA:  Multiple falls, low back pain EXAM: CT CHEST, ABDOMEN AND PELVIS WITHOUT CONTRAST CT THORACIC AND LUMBAR SPINE WITHOUT CONTRAST TECHNIQUE: Multidetector CT imaging of the chest, abdomen and pelvis was performed following the standard protocol without IV contrast. Multidetector CT imaging of the thoracic and lumbar spine was performed following the standard protocol without IV contrast. RADIATION DOSE REDUCTION: This exam was performed according to the departmental dose-optimization program which includes automated exposure control, adjustment of the mA and/or kV according to patient size and/or use of iterative reconstruction technique. COMPARISON:  CT chest angiogram and abdomen pelvis, 12/01/2022, MR abdomen, 12/02/2022 FINDINGS: CT CHEST FINDINGS Cardiovascular: Aortic atherosclerosis. Normal heart size. No pericardial effusion. Mediastinum/Nodes: No enlarged mediastinal, hilar, or axillary lymph nodes. Thyroid gland, trachea, and esophagus demonstrate no significant findings. Lungs/Pleura: Diffuse bilateral bronchial wall thickening. Heterogeneous ground-glass airspace opacity throughout the dependent right lower lobe,  new compared to prior examination (series 6, image 43). No pleural effusion or pneumothorax. Musculoskeletal: No chest wall mass or suspicious osseous lesions identified. Osteopenia. CT ABDOMEN PELVIS FINDINGS Hepatobiliary: No focal liver abnormality is seen. Status post cholecystectomy. Unchanged biliary ductal dilatation. Pancreas: Scattered calcifications throughout the pancreas. Cystic lesions within the pancreas better detailed by recent prior MR. No pancreatic ductal dilatation or surrounding inflammatory changes. Spleen: Normal in size without significant abnormality. Adrenals/Urinary Tract: Adrenal glands are unremarkable. Multiple small bilateral nonobstructive renal calculi. No ureteral calculi or hydronephrosis. Numerous bilateral renal lesions of varying attenuation, previously characterized by MR as definitively benign simple and hemorrhagic or proteinaceous cysts, requiring no further  follow-up or characterization. Bladder is unremarkable. Stomach/Bowel: Stomach is within normal limits. Appendix appears normal. No evidence of bowel wall thickening, distention, or inflammatory changes. Sigmoid diverticulosis. Moderate burden of stool throughout the colon and rectum. Vascular/Lymphatic: Aortic atherosclerosis. No enlarged abdominal or pelvic lymph nodes. Reproductive: Status post hysterectomy. Other: No abdominal wall hernia or abnormality. No ascites. Musculoskeletal: No acute osseous findings. CT THORACIC AND LUMBAR SPINE FINDINGS Alignment: Normal thoracic kyphosis. Normal lumbar lordosis. Vertebral bodies: Osteopenia. Unchanged superior endplate wedge deformities of T11, T12, L1, and L3. Disc spaces: Mild multilevel disc space height loss and osteophytosis throughout the thoracic and lumbar spine. Congenital ankylosis of L4-L5. Paraspinous soft tissues: Unremarkable. IMPRESSION: 1. Heterogeneous ground-glass airspace opacity throughout the dependent right lower lobe, new compared to prior  examination, consistent with infection or aspiration. 2. No noncontrast evidence of acute traumatic injury to the chest, abdomen, or pelvis. 3. Unchanged superior endplate wedge deformities of T11, T12, L1, and L3. 4. Osteopenia. 5. Nonobstructive bilateral nephrolithiasis. 6. Sigmoid diverticulosis without evidence of acute diverticulitis. Aortic Atherosclerosis (ICD10-I70.0). Electronically Signed   By: Jearld Lesch M.D.   On: 12/09/2022 13:46   CT HEAD WO CONTRAST  Result Date: 12/09/2022 CLINICAL DATA:  Multiple falls, pain EXAM: CT HEAD WITHOUT CONTRAST CT CERVICAL SPINE WITHOUT CONTRAST TECHNIQUE: Multidetector CT imaging of the head and cervical spine was performed following the standard protocol without intravenous contrast. Multiplanar CT image reconstructions of the cervical spine were also generated. RADIATION DOSE REDUCTION: This exam was performed according to the departmental dose-optimization program which includes automated exposure control, adjustment of the mA and/or kV according to patient size and/or use of iterative reconstruction technique. COMPARISON:  None Available. FINDINGS: CT HEAD FINDINGS Brain: No evidence of acute infarction, hemorrhage, hydrocephalus, extra-axial collection or mass lesion/mass effect. Mild periventricular white matter hypodensity. Vascular: No hyperdense vessel or unexpected calcification. Skull: Normal. Negative for fracture or focal lesion. Sinuses/Orbits: No acute finding. Other: None. CT CERVICAL SPINE FINDINGS Alignment: Degenerative straightening and reversal of the normal cervical lordosis. Skull base and vertebrae: No acute fracture. No primary bone lesion or focal pathologic process. Soft tissues and spinal canal: No prevertebral fluid or swelling. No visible canal hematoma. Disc levels: Ankylosis of C5-C6 with moderate disc degenerative disease C7-T1. Upper chest: Negative. Other: None. IMPRESSION: 1. No acute intracranial pathology. Mild small-vessel  white matter disease. 2. No fracture or static subluxation of the cervical spine. 3. Ankylosis of C5-C6 with moderate disc degenerative disease C7-T1. Electronically Signed   By: Jearld Lesch M.D.   On: 12/09/2022 13:33   CT CERVICAL SPINE WO CONTRAST  Result Date: 12/09/2022 CLINICAL DATA:  Multiple falls, pain EXAM: CT HEAD WITHOUT CONTRAST CT CERVICAL SPINE WITHOUT CONTRAST TECHNIQUE: Multidetector CT imaging of the head and cervical spine was performed following the standard protocol without intravenous contrast. Multiplanar CT image reconstructions of the cervical spine were also generated. RADIATION DOSE REDUCTION: This exam was performed according to the departmental dose-optimization program which includes automated exposure control, adjustment of the mA and/or kV according to patient size and/or use of iterative reconstruction technique. COMPARISON:  None Available. FINDINGS: CT HEAD FINDINGS Brain: No evidence of acute infarction, hemorrhage, hydrocephalus, extra-axial collection or mass lesion/mass effect. Mild periventricular white matter hypodensity. Vascular: No hyperdense vessel or unexpected calcification. Skull: Normal. Negative for fracture or focal lesion. Sinuses/Orbits: No acute finding. Other: None. CT CERVICAL SPINE FINDINGS Alignment: Degenerative straightening and reversal of the normal cervical lordosis. Skull base and vertebrae: No acute fracture. No primary  bone lesion or focal pathologic process. Soft tissues and spinal canal: No prevertebral fluid or swelling. No visible canal hematoma. Disc levels: Ankylosis of C5-C6 with moderate disc degenerative disease C7-T1. Upper chest: Negative. Other: None. IMPRESSION: 1. No acute intracranial pathology. Mild small-vessel white matter disease. 2. No fracture or static subluxation of the cervical spine. 3. Ankylosis of C5-C6 with moderate disc degenerative disease C7-T1. Electronically Signed   By: Jearld Lesch M.D.   On: 12/09/2022 13:33    MR ABDOMEN WITH MRCP W CONTRAST  Result Date: 12/03/2022 CLINICAL DATA:  Evaluate common bile duct dilatation EXAM: MRI ABDOMEN WITH CONTRAST (WITH MRCP) TECHNIQUE: Multiplanar multisequence MR imaging of the abdomen was performed following the administration of intravenous contrast. Heavily T2-weighted images of the biliary and pancreatic ducts were obtained, and three-dimensional MRCP images were rendered by post processing. CONTRAST:  5mL GADAVIST GADOBUTROL 1 MMOL/ML IV SOLN COMPARISON:  12/01/2022 FINDINGS: Lower chest: Trace left pleural effusion. Hepatobiliary: No focal enhancing liver lesions. Cholecystectomy. Chronic intrahepatic and common bile duct dilatation is identified. The proximal CBD measures 1.1 cm in the distal common bile duct measures 1.1 cm. Abrupt decreased caliber of the distal common bile duct is noted at the and Biel. Although limited by mild motion artifact there are no signs to indicate choledocholithiasis. Pancreas: No signs of pancreatic inflammation. The main pancreatic duct is ectatic measuring up to 3 mm. Multiple uniformly T2 hyperintense cystic lesions are identified. The majority of these all measure less than 1 cm. The largest is in the tail of pancreas measuring 8 mm, image 21/14. Spleen:  Within normal limits in size and appearance. Adrenals/Urinary Tract:  Normal adrenal glands. No hydronephrosis. Numerous kidney cysts are identified bilaterally. These are too numerous to count. The majority of these are uniformly T2 hyperintense compatible with benign Bosniak class 1 lesions. Cluster of subjacent cysts with intrinsic T1 hyperintensity are identified within the upper pole of the left kidney compatible with hemorrhagic cysts, image 37/20. Exophytic, uniformly T1 hyperintense cyst off the anterolateral cortex of the lower pole of right kidney measures 1.7 cm, image 66/2. This is also compatible with a hemorrhagic cyst. No follow-up imaging of the cysts recommended.  Stomach/Bowel: Visualized portions within the abdomen are unremarkable. Vascular/Lymphatic: The upper abdominal vascularity appears patent. No abdominal adenopathy. Other:  No ascites. Musculoskeletal: Superior endplate compression fractures are identified involving the T11, T12, L1 and L3 vertebra. Up to 40% of the vertebral body height at these levels, most severe at L1. There is loss of there is associated endplate enhancement identified at these levels with mild paraspinal edema at T11 through L1. IMPRESSION: 1. Chronic intrahepatic and common bile duct dilatation is identified. Abrupt decreased caliber of the distal common bile duct is noted at the ampulla. Although limited by mild motion artifact there are no signs to indicate choledocholithiasis or obstructing mass. 2. Multiple uniformly T2 hyperintense cystic lesions are identified within the pancreas. The majority of these all measure less than 1 cm. The largest is in the tail of pancreas measuring 8 mm. These are likely side branch IPMNs. Recommend follow-up pancreas protocol MRI in 2 years. 3. Superior endplate compression fractures are identified involving the T11, T12, L1 and L3 vertebra. Up to 40% of the vertebral body height at these levels, most severe at L1. There is associated endplate enhancement and mild paraspinal edema at T11 through L1. These are likely subacute. 4. Trace left pleural effusion. Electronically Signed   By: Veronda Prude.D.  On: 12/03/2022 05:06   CT ABDOMEN PELVIS W CONTRAST  Result Date: 12/01/2022 CLINICAL DATA:  Diffuse abdominal pain and anorexia for 3 days. Elevated liver function tests. EXAM: CT ABDOMEN AND PELVIS WITH CONTRAST TECHNIQUE: Multidetector CT imaging of the abdomen and pelvis was performed using the standard protocol following bolus administration of intravenous contrast. RADIATION DOSE REDUCTION: This exam was performed according to the departmental dose-optimization program which includes automated  exposure control, adjustment of the mA and/or kV according to patient size and/or use of iterative reconstruction technique. CONTRAST:  75 mL OMNIPAQUE IOHEXOL 350 MG/ML SOLN COMPARISON:  12/17/2020 FINDINGS: Lower Chest: No acute findings. Hepatobiliary: No suspicious hepatic masses identified. Prior cholecystectomy again noted. Diffuse biliary ductal dilatation remains unchanged. Pancreas: No evidence of pancreatic mass or acute pancreatitis. Scattered tiny pancreatic calcifications are noted, consistent with chronic pancreatitis. No evidence of peripancreatic fluid collections. Spleen: Within normal limits in size and appearance. Adrenals/Urinary Tract: Multiple small benign-appearing renal cysts are again seen bilaterally (No followup imaging is recommended). No suspicious masses identified. No evidence of ureteral calculi or hydronephrosis. Stomach/Bowel: No evidence of obstruction, inflammatory process or abnormal fluid collections. Diverticulosis is seen mainly involving the sigmoid colon, however there is no evidence of diverticulitis. Vascular/Lymphatic: No pathologically enlarged lymph nodes. No acute vascular findings. Reproductive: Prior hysterectomy noted. Adnexal regions are unremarkable in appearance. Other:  None. Musculoskeletal: No suspicious bone lesions identified. Multiple lower thoracic and lumbar vertebral body compression fractures are seen, which are chronic in appearance IMPRESSION: No acute findings within the abdomen or pelvis. Prior cholecystectomy. Stable chronic diffuse biliary ductal dilatation. Chronic pancreatitis. Colonic diverticulosis, without radiographic evidence of diverticulitis. Electronically Signed   By: Danae Orleans M.D.   On: 12/01/2022 16:51   CT Angio Chest PE W and/or Wo Contrast  Result Date: 12/01/2022 CLINICAL DATA:  Chest pain and weakness shortness of breath. Probability for pulmonary embolism. EXAM: CT ANGIOGRAPHY CHEST WITH CONTRAST TECHNIQUE: Multidetector  CT imaging of the chest was performed using the standard protocol during bolus administration of intravenous contrast. Multiplanar CT image reconstructions and MIPs were obtained to evaluate the vascular anatomy. RADIATION DOSE REDUCTION: This exam was performed according to the departmental dose-optimization program which includes automated exposure control, adjustment of the mA and/or kV according to patient size and/or use of iterative reconstruction technique. CONTRAST:  75 mL OMNIPAQUE IOHEXOL 350 MG/ML SOLN COMPARISON:  None Available. FINDINGS: Cardiovascular: Satisfactory opacification of pulmonary arteries noted, and no pulmonary emboli identified. No evidence of thoracic aortic dissection or aneurysm. Mediastinum/Nodes: No masses or pathologically enlarged lymph nodes identified. Lungs/Pleura: No pulmonary mass, infiltrate, or effusion. Upper abdomen: No acute findings. Musculoskeletal: No suspicious bone lesions identified. Review of the MIP images confirms the above findings. IMPRESSION: Negative. No evidence of pulmonary embolism or other acute findings. Electronically Signed   By: Danae Orleans M.D.   On: 12/01/2022 16:43    Microbiology: Results for orders placed or performed during the hospital encounter of 12/09/22  SARS Coronavirus 2 by RT PCR (hospital order, performed in [facility] hospital lab) *cepheid single result test* Anterior Nasal Swab     Status: None   Collection Time: 12/09/22  9:24 PM   Specimen: Anterior Nasal Swab  Result Value Ref Range Status   SARS Coronavirus 2 by RT PCR NEGATIVE NEGATIVE Final    Comment: (NOTE) SARS-CoV-2 target nucleic acids are NOT DETECTED.  The SARS-CoV-2 RNA is generally detectable in upper and lower respiratory specimens during the acute phase of infection. The  lowest concentration of SARS-CoV-2 viral copies this assay can detect is 250 copies / mL. A negative result does not preclude SARS-CoV-2 infection and should not be used as the  sole basis for treatment or other patient management decisions.  A negative result may occur with improper specimen collection / handling, submission of specimen other than nasopharyngeal swab, presence of viral mutation(s) within the areas targeted by this assay, and inadequate number of viral copies (<250 copies / mL). A negative result must be combined with clinical observations, patient history, and epidemiological information.  Fact Sheet for Patients:   RoadLapTop.co.za  Fact Sheet for Healthcare Providers: http://kim-miller.com/  This test is not yet approved or  cleared by the [place] FDA and has been authorized for detection and/or diagnosis of SARS-CoV-2 by FDA under an Emergency Use Authorization (EUA).  This EUA will remain in effect (meaning this test can be used) for the duration of the COVID-19 declaration under Section 564(b)(1) of the Act, 21 U.S.C. section 360bbb-3(b)(1), unless the authorization is terminated or revoked sooner.  Performed at [facility], [address], [place], [place] [address]     Labs: CBC: Recent Labs  Lab 12/04/22 0410 12/09/22 1134 12/10/22 0441  WBC 12.6* 18.2* 12.7*  HGB 12.2 13.2 11.4*  HCT 38.5 41.2 35.9*  MCV 92.1 93.4 93.2  PLT 320 321 350   Basic Metabolic Panel: Recent Labs  Lab 12/04/22 0410 12/09/22 1134 12/10/22 0441  NA 141 143 139  K 4.0 3.8 3.5  CL 103 106 103  CO2 29 26 29   GLUCOSE 157* 118* 73  BUN 25* 20 18  CREATININE 0.52 0.80 0.62  CALCIUM 9.3 9.0 8.5*  MG 1.9 2.3  --    Liver Function Tests: Recent Labs  Lab 12/04/22 0410 12/09/22 1134  AST 25 21  ALT 196* 60*  ALKPHOS 227* 163*  BILITOT 0.2* 0.5  PROT 5.8* 6.2*  ALBUMIN 2.9* 3.2*   CBG: No results for input(s): "GLUCAP" in the last 168 hours.  Discharge time spent: greater than 30 minutes.  Signed: Vassie Loll, MD [facility] 12/10/2022

## 2022-12-10 NOTE — Evaluation (Signed)
Physical Therapy Evaluation Patient Details Name: [name] MRN: [id number] DOB: 11-01-1950 Today's Date: 12/10/2022  History of Present Illness  [name] is a 72 y.o. female with medical history significant for stroke, chronic pancreatitis, COPD, stage IV decubitus ulcer, diastolic CHF.  Patient presented to the ED from assisted living facility with reports of 2 falls today, and generalized weakness.  Patient tells me she felt weak all over and this resulted in the fall.  No difficulty breathing.  No cough.  No chest pain.  No dizziness.  No vomiting.  No loose stools.  Reports urinary symptoms of frequency and dysuria.  She denies problems with swallowing  She reports chronic unchanged low back pain, chronic unchanged abdominal pain related to her chronic pancreatitis.     Recently hospitalized 9/22 to 9/26 for COPD exacerbation, hypoxic and hypercarbic respiratory failure, also had elevated LFTs, she was to follow-up outpatient for EUS.  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   Clinical Impression  Patient unsteady on feet and required the use of RW for safety demonstrating slow labored cadence during gait training without loss of balance and tolerated sitting up in chair after therapy.  Patient will benefit from continued skilled physical therapy in hospital and recommended venue below to increase strength, balance, endurance for safe ADLs and gait.         If plan is discharge home, recommend the following: A little help with walking and/or transfers;A little help with bathing/dressing/bathroom;Help with stairs or ramp for entrance;Assistance with cooking/housework   Can travel by private vehicle        Equipment Recommendations None recommended by PT  Recommendations for Other Services       Functional Status Assessment Patient has not had a recent decline in their functional status     Precautions  / Restrictions Precautions Precautions: Fall Restrictions Weight Bearing Restrictions: No      Mobility  Bed Mobility Overal bed mobility: Needs Assistance Bed Mobility: Supine to Sit     Supine to sit: Supervision     General bed mobility comments: increased time, labored movement    Transfers Overall transfer level: Needs assistance Equipment used: None, Rolling walker (2 wheels) Transfers: Sit to/from Stand, Bed to chair/wheelchair/BSC Sit to Stand: Contact guard assist   Step pivot transfers: Contact guard assist       General transfer comment: unsteady on feet having to lean on nearby objects for support without AD, safer using RW    Ambulation/Gait Ambulation/Gait assistance: Supervision, Contact guard assist Gait Distance (Feet): 75 Feet Assistive device: Rolling walker (2 wheels) Gait Pattern/deviations: Decreased step length - left, Decreased stance time - right, Decreased stride length, Trunk flexed Gait velocity: decreased     General Gait Details: slightly labored cadence without loss of balance, limited mostly due to fatigue  Stairs            Wheelchair Mobility     Tilt Bed    Modified Rankin (Stroke Patients Only)       Balance Overall balance assessment: Needs assistance Sitting-balance support: Feet supported, No upper extremity supported Sitting balance-Leahy Scale: Good Sitting balance - Comments: seated at EOB   Standing balance support: During functional activity, No upper extremity supported Standing balance-Leahy Scale: Poor Standing balance comment: fair/good using RW  Pertinent Vitals/Pain Pain Assessment Pain Assessment: 0-10 Pain Score: 5  Pain Location: stomach Pain Descriptors / Indicators: Aching, Discomfort Pain Intervention(s): Limited activity within patient's tolerance, Monitored during session, Repositioned    Home Living Family/patient expects to be discharged  to:: Assisted living                 Home Equipment: Agricultural consultant (2 wheels);Cane - single point;Wheelchair - manual      Prior Function Prior Level of Function : Needs assist       Physical Assist : Mobility (physical);ADLs (physical) Mobility (physical): Bed mobility;Transfers;Gait;Stairs   Mobility Comments: household ambulator without use of AD most of time, uses SPC or RW whe needed ADLs Comments: assisted by ALF staff     Extremity/Trunk Assessment   Upper Extremity Assessment Upper Extremity Assessment: Overall WFL for tasks assessed    Lower Extremity Assessment Lower Extremity Assessment: Generalized weakness    Cervical / Trunk Assessment Cervical / Trunk Assessment: Normal  Communication   Communication Communication: No apparent difficulties  Cognition Arousal: Alert Behavior During Therapy: WFL for tasks assessed/performed Overall Cognitive Status: Within Functional Limits for tasks assessed                                          General Comments      Exercises     Assessment/Plan    PT Assessment Patient needs continued PT services  PT Problem List Decreased strength;Decreased activity tolerance;Decreased balance;Decreased mobility       PT Treatment Interventions DME instruction;Gait training;Stair training;Functional mobility training;Therapeutic activities;Therapeutic exercise;Patient/family education    PT Goals (Current goals can be found in the Care Plan section)  Acute Rehab PT Goals Patient Stated Goal: return home with ALF staff to assist PT Goal Formulation: With patient Time For Goal Achievement: 12/14/22 Potential to Achieve Goals: Good    Frequency Min 3X/week     Co-evaluation               AM-PAC PT "6 Clicks" Mobility  Outcome Measure Help needed turning from your back to your side while in a flat bed without using bedrails?: None Help needed moving from lying on your back to sitting on  the side of a flat bed without using bedrails?: A Little Help needed moving to and from a bed to a chair (including a wheelchair)?: A Little Help needed standing up from a chair using your arms (e.g., wheelchair or bedside chair)?: A Little Help needed to walk in hospital room?: A Little Help needed climbing 3-5 steps with a railing? : A Little 6 Click Score: 19    End of Session   Activity Tolerance: Patient tolerated treatment well Patient left: in chair;with call bell/phone within reach Nurse Communication: Mobility status PT Visit Diagnosis: Unsteadiness on feet (R26.81);Other abnormalities of gait and mobility (R26.89);Muscle weakness (generalized) (M62.81)    Time: 7846-9629 PT Time Calculation (min) (ACUTE ONLY): 23 min   Charges:   PT Evaluation $PT Eval Moderate Complexity: 1 Mod PT Treatments $Therapeutic Activity: 23-37 mins PT General Charges $$ ACUTE PT VISIT: 1 Visit         1:47 PM, 12/10/22 Ocie Bob, MPT Physical Therapist with [facility] [phone] office [phone] mobile phone

## 2022-12-10 NOTE — Progress Notes (Signed)
Telemetry called stating patient had a seven beat run of vtach, Dr. Carren Rang made aware, no new orders at this time,

## 2022-12-10 NOTE — Evaluation (Signed)
Clinical/Bedside Swallow Evaluation Patient Details  Name: [name] MRN: [id number] Date of Birth: 01-19-1951  Today's Date: 12/10/2022 Time: SLP Start Time (ACUTE ONLY): 1154 SLP Stop Time (ACUTE ONLY): 1209 SLP Time Calculation (min) (ACUTE ONLY): 15 min  Past Medical History:  Past Medical History:  Diagnosis Date   Acute blood loss anemia    Arrhythmia    Arthritis    C. difficile colitis    Cancer (HCC)    COPD (chronic obstructive pulmonary disease) (HCC)    Diverticulosis    Fibromyalgia    GERD (gastroesophageal reflux disease)    GI hemorrhage    H/O: substance abuse (HCC) 2005   narcotic usage due to chronic back pain   Heart murmur    Hypertension    Pancreatic cyst    Pneumococcal meningitis    PONV (postoperative nausea and vomiting)    Renal cyst    Stroke (cerebrum) (HCC)    Past Surgical History:  Past Surgical History:  Procedure Laterality Date   ABDOMINAL HYSTERECTOMY     ABDOMINAL SURGERY     APPENDECTOMY     BIOPSY  04/22/2020   Procedure: BIOPSY;  Surgeon: Rachael Fee, MD;  Location: [facility];  Service: Gastroenterology;;   BREAST SURGERY     CHOLECYSTECTOMY N/A 09/30/2012   Procedure: LAPAROSCOPIC CHOLECYSTECTOMY WITH INTRAOPERATIVE CHOLANGIOGRAM;  Surgeon: Wilmon Arms. Corliss Skains, MD;  Location: [facility];  Service: General;  Laterality: N/A;   ESOPHAGOGASTRODUODENOSCOPY (EGD) WITH PROPOFOL N/A 04/22/2020   Procedure: ESOPHAGOGASTRODUODENOSCOPY (EGD) WITH PROPOFOL;  Surgeon: Rachael Fee, MD;  Location: [facility];  Service: Gastroenterology;  Laterality: N/A;   FRACTURE SURGERY     right upper arm   OVARIAN CYST SURGERY     HPI:  [name] is a 72 y.o. female with medical history significant for stroke, chronic pancreatitis, COPD, stage IV decubitus ulcer, diastolic CHF.  Patient presented to the ED from assisted living facility with reports of 2 falls today, and generalized weakness.  Patient tells me she felt weak all over  and this resulted in the fall.  No difficulty breathing.  No cough.  No chest pain.  No dizziness.  No vomiting.  No loose stools.  Reports urinary symptoms of frequency and dysuria.  She denies problems with swallowing  She reports chronic unchanged low back pain, chronic unchanged abdominal pain related to her chronic pancreatitis.     Recently hospitalized 9/22 to 9/26 for COPD exacerbation, hypoxic and hypercarbic respiratory failure, also had elevated LFTs, she was to follow-up outpatient for EUS.  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 ST consulted for swallow evaluation d/t recent dx of PNA.   Assessment / Plan / Recommendation  Clinical Impression  Pt seen for clinical swallowing evaluation with a normal oropharyngeal swallow observed during consumption of thin liquids and solids.  Pt with limited dentition, but mastication efforts adequate despite this fact.  Pt consumed successive swallows of thin via straw and solids without overt s/sx of aspiration noted.  Pt denies dysphagia, but is at limited risk d/t dx of COPD and generalized weakness.  Strong cough/volitional swallow observed.  OME unremarkable.  Fatigue could potentially impact safety, but during trial, pt did not display any dysphagia symptoms.  Recommend continue current diet of Regular/thin liquids with general swallowing precautions of small bites/sips and upright positioning during meals/snacks.  Medications whole with liquid as tolerated.  ST will s/o in acute  setting.  Thank you for this consult. SLP Visit Diagnosis: Dysphagia, unspecified (R13.10)    Aspiration Risk  No limitations    Diet Recommendation   Thin;Age appropriate regular  Medication Administration: Whole meds with liquid    Other  Recommendations Oral Care Recommendations: Oral care BID    Recommendations for follow up therapy are one component of a multi-disciplinary discharge planning  process, led by the attending physician.  Recommendations may be updated based on patient status, additional functional criteria and insurance authorization.  Follow up Recommendations No SLP follow up      Assistance Recommended at Discharge  Intermittent/prn  Functional Status Assessment Patient has had a recent decline in their functional status and demonstrates the ability to make significant improvements in function in a reasonable and predictable amount of time.  Frequency and Duration  (evaluation only)          Prognosis Prognosis for improved oropharyngeal function: Good      Swallow Study   General Date of Onset: 12/09/22 HPI: [name] is a 72 y.o. female with medical history significant for stroke, chronic pancreatitis, COPD, stage IV decubitus ulcer, diastolic CHF.  Patient presented to the ED from assisted living facility with reports of 2 falls today, and generalized weakness.  Patient tells me she felt weak all over and this resulted in the fall.  No difficulty breathing.  No cough.  No chest pain.  No dizziness.  No vomiting.  No loose stools.  Reports urinary symptoms of frequency and dysuria.  She denies problems with swallowing  She reports chronic unchanged low back pain, chronic unchanged abdominal pain related to her chronic pancreatitis.     Recently hospitalized 9/22 to 9/26 for COPD exacerbation, hypoxic and hypercarbic respiratory failure, also had elevated LFTs, she was to follow-up outpatient for EUS.  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 Type of Study: Bedside Swallow Evaluation Previous Swallow Assessment: n/a Diet Prior to this Study: Regular;Thin liquids (Level 0) Temperature Spikes Noted: No Respiratory Status: Room air History of Recent Intubation: No Behavior/Cognition: Alert;Cooperative Oral Cavity Assessment: Within Functional Limits Oral Care Completed by SLP: Recent  completion by staff Oral Cavity - Dentition: Dentures, top;Missing dentition Vision: Functional for self-feeding Self-Feeding Abilities: Able to feed self Patient Positioning: Upright in chair Baseline Vocal Quality: Normal Volitional Cough: Strong Volitional Swallow: Able to elicit    Oral/Motor/Sensory Function Overall Oral Motor/Sensory Function: Within functional limits   Ice Chips Ice chips: Not tested   Thin Liquid Thin Liquid: Within functional limits Presentation: Straw    Nectar Thick Nectar Thick Liquid: Not tested   Honey Thick Honey Thick Liquid: Not tested   Puree Puree: Within functional limits Presentation: Self Fed   Solid     Solid: Within functional limits Presentation: Self Fed      Pat [name],M.S.,CCC-SLP 12/10/2022,12:05 PM

## 2022-12-11 LAB — URINE CULTURE

## 2022-12-16 DIAGNOSIS — Z09 Encounter for follow-up examination after completed treatment for conditions other than malignant neoplasm: Secondary | ICD-10-CM | POA: Diagnosis not present

## 2022-12-16 DIAGNOSIS — Z681 Body mass index (BMI) 19 or less, adult: Secondary | ICD-10-CM | POA: Diagnosis not present

## 2022-12-16 DIAGNOSIS — Z9181 History of falling: Secondary | ICD-10-CM | POA: Diagnosis not present

## 2022-12-16 DIAGNOSIS — J449 Chronic obstructive pulmonary disease, unspecified: Secondary | ICD-10-CM | POA: Diagnosis not present

## 2022-12-16 DIAGNOSIS — H109 Unspecified conjunctivitis: Secondary | ICD-10-CM | POA: Diagnosis not present

## 2022-12-16 DIAGNOSIS — R2689 Other abnormalities of gait and mobility: Secondary | ICD-10-CM | POA: Diagnosis not present

## 2022-12-16 DIAGNOSIS — E46 Unspecified protein-calorie malnutrition: Secondary | ICD-10-CM | POA: Diagnosis not present

## 2022-12-16 DIAGNOSIS — I639 Cerebral infarction, unspecified: Secondary | ICD-10-CM | POA: Diagnosis not present

## 2022-12-16 DIAGNOSIS — K861 Other chronic pancreatitis: Secondary | ICD-10-CM | POA: Diagnosis not present

## 2022-12-16 DIAGNOSIS — F17210 Nicotine dependence, cigarettes, uncomplicated: Secondary | ICD-10-CM | POA: Diagnosis not present

## 2022-12-17 IMAGING — CT CT HEAD W/O CM
3 series · 14 of 45 positions shown, 16 images · non-contrast
Comparison: 09/28/2020

CLINICAL DATA: Failure to thrive.  Dementia

EXAM:
CT HEAD WITHOUT CONTRAST
TECHNIQUE: Contiguous axial images were obtained from the base of the skull
through the vertex without intravenous contrast.

[Series 2: head wo · axial · 0.47mm/px · z∈[+1346,+1461]mm · 8 of 28 slices shown, 10 images]
[im 3/28  brain]
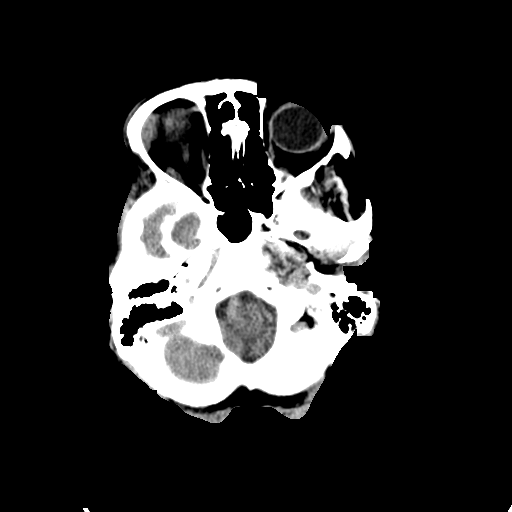
[im 3/28  bone]
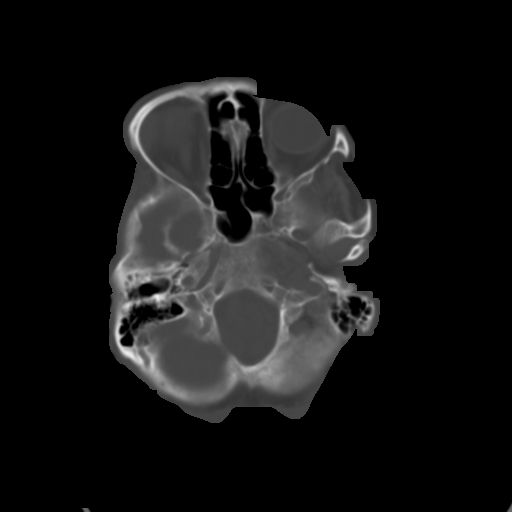
[im 6/28  brain]
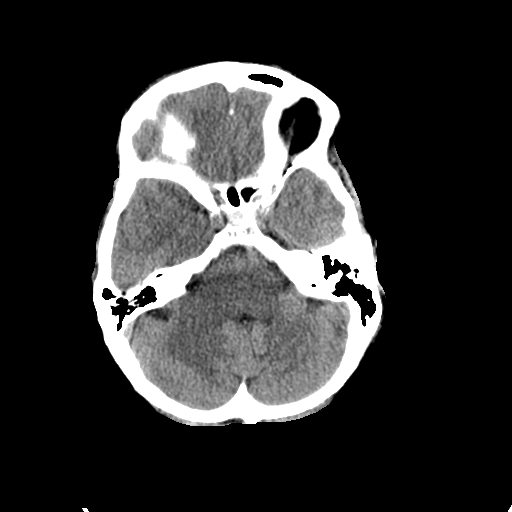
[im 10/28  brain]
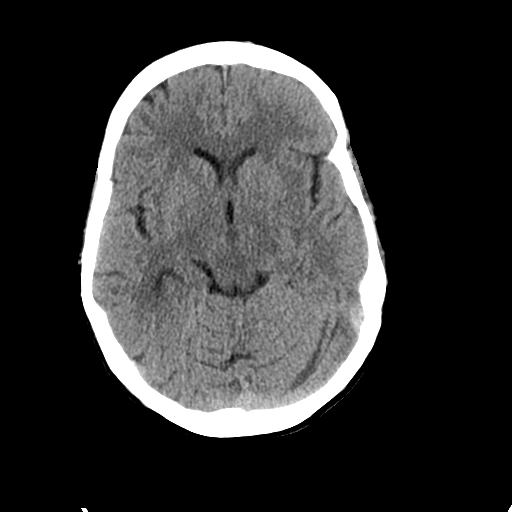
[im 13/28  brain]
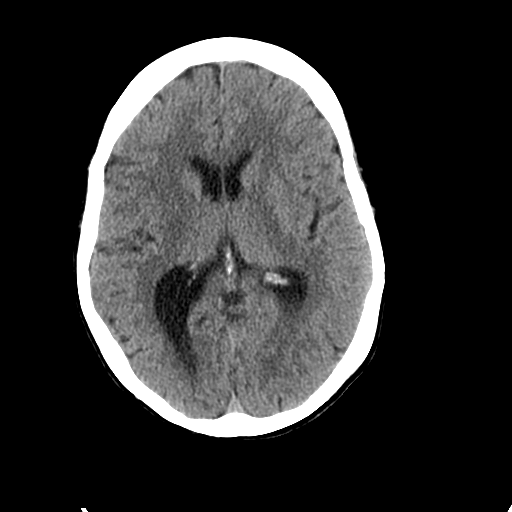
[im 16/28  brain]
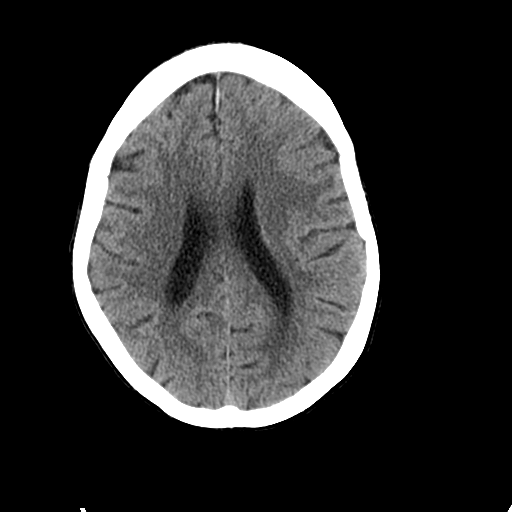
[im 16/28  bone]
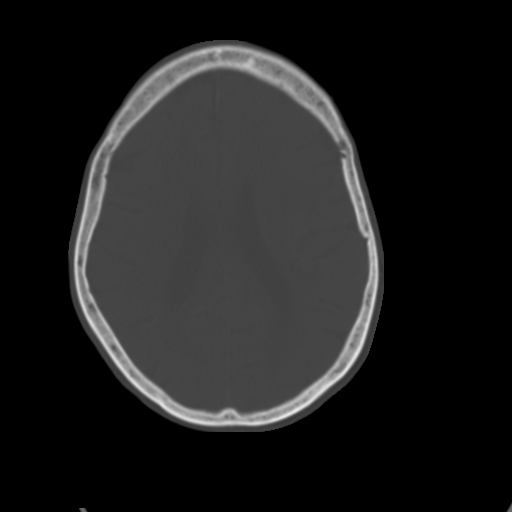
[im 19/28  brain]
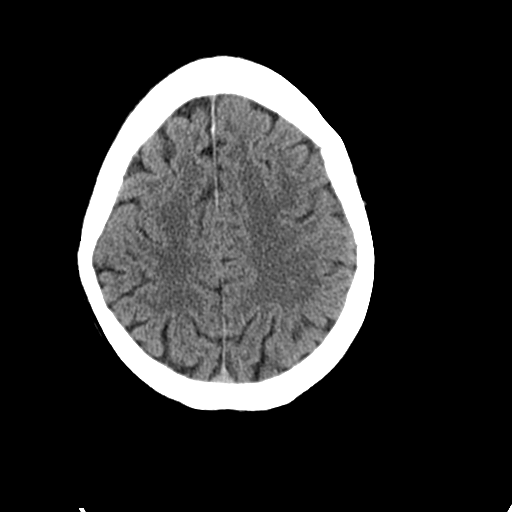
[im 23/28  brain]
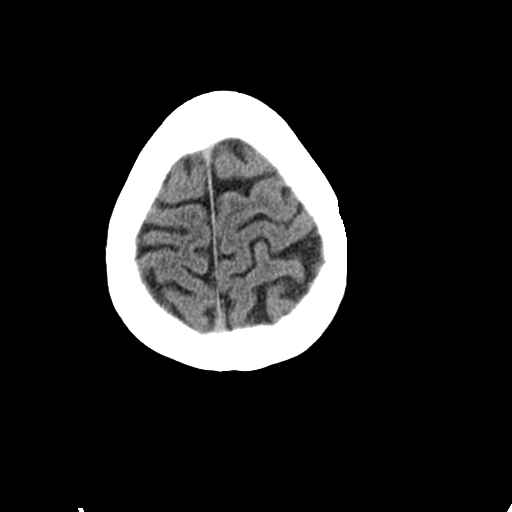
[im 26/28  brain]
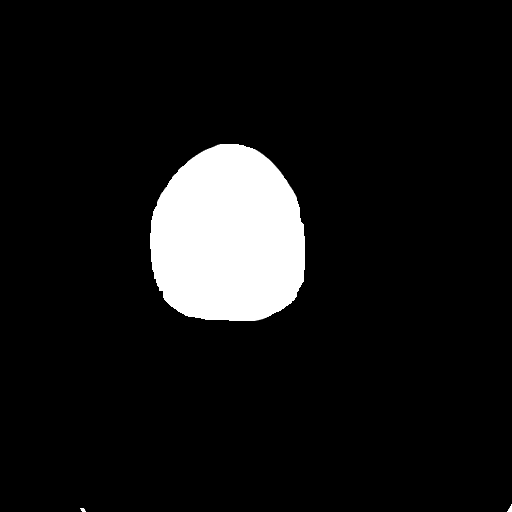

[Series 5: coronal soft tissue · coronal · 0.32mm/px · 3 of 65 slices shown]
[im 22/65  brain]
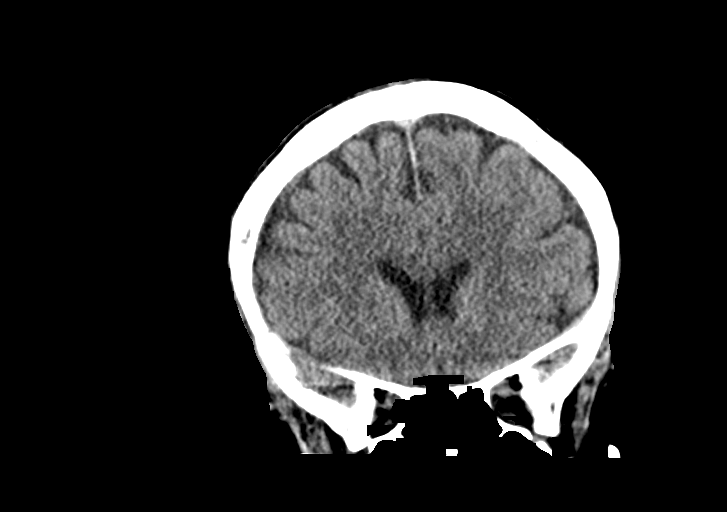
[im 29/65  brain]
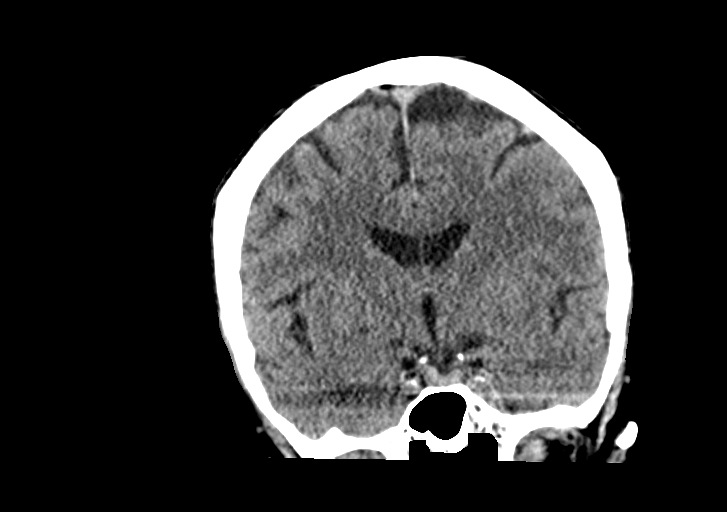
[im 36/65  brain]
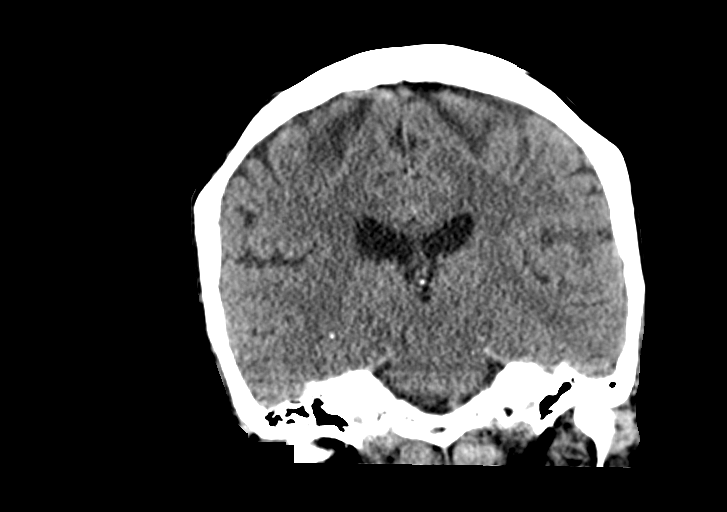

[Series 6: sagittal soft tissue · sagittal · 0.35mm/px · 3 of 50 slices shown]
[im 17/50  brain]
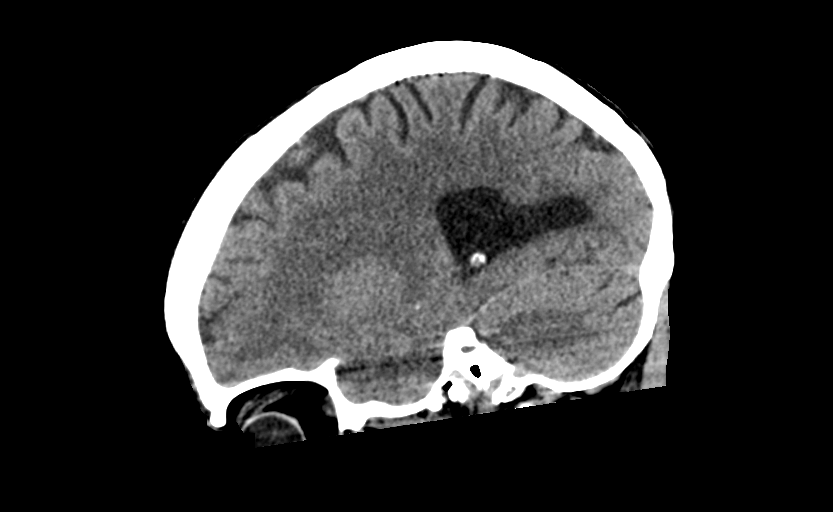
[im 25/50  brain]
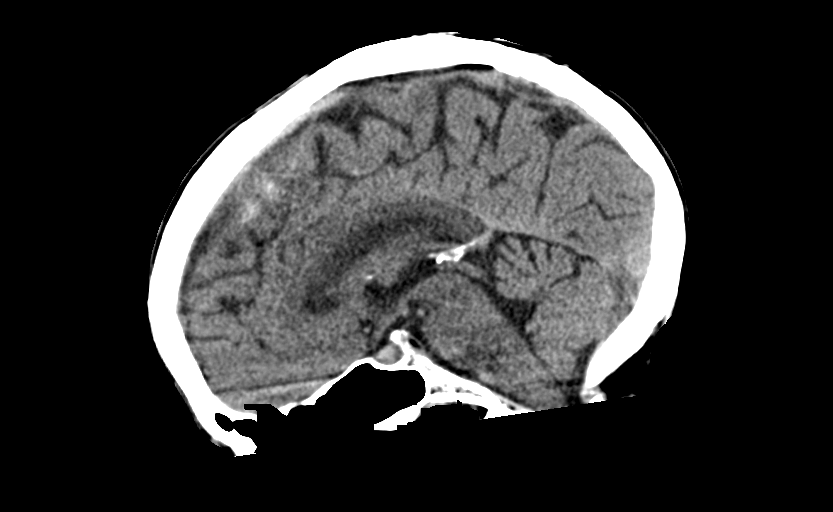
[im 33/50  brain]
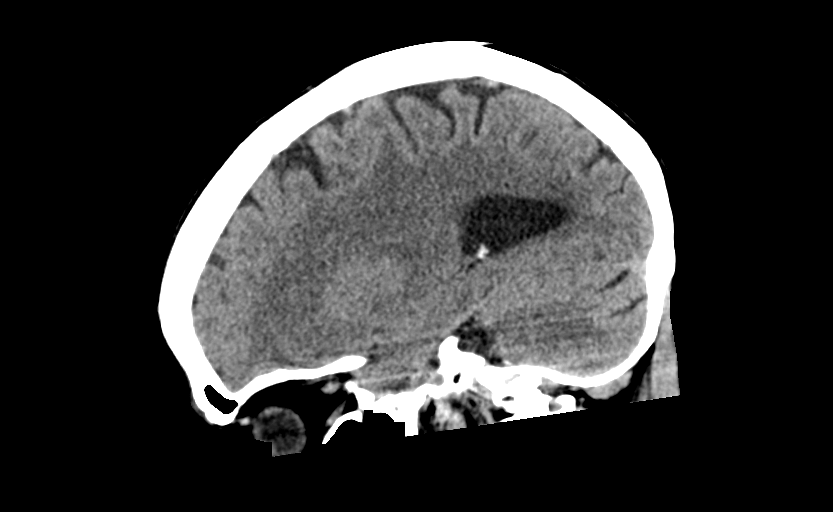

[14 of 45 positions shown; findings below may reference images not displayed]

FINDINGS: Brain: No evidence of acute infarction, hemorrhage, hydrocephalus,
extra-axial collection or mass lesion/mass effect.

Vascular: Atherosclerotic calcifications involving the large vessels
of the skull base. No unexpected hyperdense vessel.

Skull: Negative for calvarial fracture.

Sinuses/Orbits: No acute finding.

Other: None.
IMPRESSION: No acute intracranial findings.

## 2022-12-22 ENCOUNTER — Emergency Department (HOSPITAL_COMMUNITY): Payer: 59

## 2022-12-22 ENCOUNTER — Inpatient Hospital Stay (HOSPITAL_COMMUNITY): Payer: 59

## 2022-12-22 ENCOUNTER — Inpatient Hospital Stay (HOSPITAL_COMMUNITY)
Admission: EM | Admit: 2022-12-22 | Discharge: 2023-01-05 | DRG: 963 | Disposition: A | Discharge status: Skilled Nursing Facility | Payer: 59 | Source: Skilled Nursing Facility | Attending: General Surgery | Admitting: General Surgery

## 2022-12-22 ENCOUNTER — Encounter (HOSPITAL_COMMUNITY): Payer: Self-pay | Admitting: *Deleted

## 2022-12-22 ENCOUNTER — Other Ambulatory Visit: Payer: Self-pay

## 2022-12-22 DIAGNOSIS — M797 Fibromyalgia: Secondary | ICD-10-CM | POA: Diagnosis present

## 2022-12-22 DIAGNOSIS — Z860100 Personal history of colon polyps, unspecified: Secondary | ICD-10-CM

## 2022-12-22 DIAGNOSIS — D6832 Hemorrhagic disorder due to extrinsic circulating anticoagulants: Secondary | ICD-10-CM | POA: Diagnosis not present

## 2022-12-22 DIAGNOSIS — M5030 Other cervical disc degeneration, unspecified cervical region: Secondary | ICD-10-CM | POA: Diagnosis not present

## 2022-12-22 DIAGNOSIS — S0633AA Contusion and laceration of cerebrum, unspecified, with loss of consciousness status unknown, initial encounter: Secondary | ICD-10-CM | POA: Diagnosis not present

## 2022-12-22 DIAGNOSIS — S22080D Wedge compression fracture of T11-T12 vertebra, subsequent encounter for fracture with routine healing: Secondary | ICD-10-CM | POA: Diagnosis not present

## 2022-12-22 DIAGNOSIS — K5730 Diverticulosis of large intestine without perforation or abscess without bleeding: Secondary | ICD-10-CM | POA: Diagnosis not present

## 2022-12-22 DIAGNOSIS — F32A Depression, unspecified: Secondary | ICD-10-CM | POA: Diagnosis present

## 2022-12-22 DIAGNOSIS — K921 Melena: Secondary | ICD-10-CM | POA: Diagnosis present

## 2022-12-22 DIAGNOSIS — M6281 Muscle weakness (generalized): Secondary | ICD-10-CM | POA: Diagnosis not present

## 2022-12-22 DIAGNOSIS — I6523 Occlusion and stenosis of bilateral carotid arteries: Secondary | ICD-10-CM | POA: Diagnosis not present

## 2022-12-22 DIAGNOSIS — S06330A Contusion and laceration of cerebrum, unspecified, without loss of consciousness, initial encounter: Secondary | ICD-10-CM | POA: Diagnosis not present

## 2022-12-22 DIAGNOSIS — R296 Repeated falls: Secondary | ICD-10-CM | POA: Diagnosis present

## 2022-12-22 DIAGNOSIS — R000 Tachycardia, unspecified: Secondary | ICD-10-CM | POA: Diagnosis not present

## 2022-12-22 DIAGNOSIS — S32592A Other specified fracture of left pubis, initial encounter for closed fracture: Secondary | ICD-10-CM | POA: Diagnosis present

## 2022-12-22 DIAGNOSIS — I5032 Chronic diastolic (congestive) heart failure: Secondary | ICD-10-CM | POA: Diagnosis present

## 2022-12-22 DIAGNOSIS — S22088A Other fracture of T11-T12 vertebra, initial encounter for closed fracture: Secondary | ICD-10-CM | POA: Diagnosis not present

## 2022-12-22 DIAGNOSIS — G894 Chronic pain syndrome: Secondary | ICD-10-CM | POA: Diagnosis not present

## 2022-12-22 DIAGNOSIS — Z79899 Other long term (current) drug therapy: Secondary | ICD-10-CM

## 2022-12-22 DIAGNOSIS — J189 Pneumonia, unspecified organism: Secondary | ICD-10-CM | POA: Diagnosis present

## 2022-12-22 DIAGNOSIS — Z66 Do not resuscitate: Secondary | ICD-10-CM | POA: Diagnosis not present

## 2022-12-22 DIAGNOSIS — N390 Urinary tract infection, site not specified: Secondary | ICD-10-CM | POA: Diagnosis not present

## 2022-12-22 DIAGNOSIS — M79601 Pain in right arm: Secondary | ICD-10-CM | POA: Diagnosis present

## 2022-12-22 DIAGNOSIS — M4854XS Collapsed vertebra, not elsewhere classified, thoracic region, sequela of fracture: Secondary | ICD-10-CM | POA: Diagnosis present

## 2022-12-22 DIAGNOSIS — I959 Hypotension, unspecified: Secondary | ICD-10-CM | POA: Diagnosis not present

## 2022-12-22 DIAGNOSIS — Z86718 Personal history of other venous thrombosis and embolism: Secondary | ICD-10-CM | POA: Diagnosis not present

## 2022-12-22 DIAGNOSIS — S22089A Unspecified fracture of T11-T12 vertebra, initial encounter for closed fracture: Secondary | ICD-10-CM | POA: Diagnosis not present

## 2022-12-22 DIAGNOSIS — G8194 Hemiplegia, unspecified affecting left nondominant side: Secondary | ICD-10-CM | POA: Diagnosis not present

## 2022-12-22 DIAGNOSIS — T18128A Food in esophagus causing other injury, initial encounter: Secondary | ICD-10-CM | POA: Diagnosis not present

## 2022-12-22 DIAGNOSIS — R079 Chest pain, unspecified: Secondary | ICD-10-CM | POA: Diagnosis not present

## 2022-12-22 DIAGNOSIS — K449 Diaphragmatic hernia without obstruction or gangrene: Secondary | ICD-10-CM | POA: Diagnosis present

## 2022-12-22 DIAGNOSIS — S32511A Fracture of superior rim of right pubis, initial encounter for closed fracture: Secondary | ICD-10-CM | POA: Diagnosis not present

## 2022-12-22 DIAGNOSIS — J45909 Unspecified asthma, uncomplicated: Secondary | ICD-10-CM | POA: Diagnosis not present

## 2022-12-22 DIAGNOSIS — Z8042 Family history of malignant neoplasm of prostate: Secondary | ICD-10-CM

## 2022-12-22 DIAGNOSIS — S3282XA Multiple fractures of pelvis without disruption of pelvic ring, initial encounter for closed fracture: Secondary | ICD-10-CM | POA: Diagnosis not present

## 2022-12-22 DIAGNOSIS — J449 Chronic obstructive pulmonary disease, unspecified: Secondary | ICD-10-CM | POA: Diagnosis not present

## 2022-12-22 DIAGNOSIS — R410 Disorientation, unspecified: Secondary | ICD-10-CM | POA: Diagnosis not present

## 2022-12-22 DIAGNOSIS — K644 Residual hemorrhoidal skin tags: Secondary | ICD-10-CM | POA: Diagnosis present

## 2022-12-22 DIAGNOSIS — K838 Other specified diseases of biliary tract: Secondary | ICD-10-CM | POA: Diagnosis not present

## 2022-12-22 DIAGNOSIS — Z9049 Acquired absence of other specified parts of digestive tract: Secondary | ICD-10-CM | POA: Diagnosis not present

## 2022-12-22 DIAGNOSIS — F0394 Unspecified dementia, unspecified severity, with anxiety: Secondary | ICD-10-CM | POA: Diagnosis present

## 2022-12-22 DIAGNOSIS — J440 Chronic obstructive pulmonary disease with acute lower respiratory infection: Secondary | ICD-10-CM | POA: Diagnosis not present

## 2022-12-22 DIAGNOSIS — E854 Organ-limited amyloidosis: Secondary | ICD-10-CM | POA: Diagnosis not present

## 2022-12-22 DIAGNOSIS — I69398 Other sequelae of cerebral infarction: Secondary | ICD-10-CM | POA: Diagnosis not present

## 2022-12-22 DIAGNOSIS — I499 Cardiac arrhythmia, unspecified: Secondary | ICD-10-CM | POA: Diagnosis not present

## 2022-12-22 DIAGNOSIS — Z90710 Acquired absence of both cervix and uterus: Secondary | ICD-10-CM

## 2022-12-22 DIAGNOSIS — D62 Acute posthemorrhagic anemia: Secondary | ICD-10-CM | POA: Diagnosis not present

## 2022-12-22 DIAGNOSIS — Z7901 Long term (current) use of anticoagulants: Secondary | ICD-10-CM

## 2022-12-22 DIAGNOSIS — S32591A Other specified fracture of right pubis, initial encounter for closed fracture: Secondary | ICD-10-CM | POA: Diagnosis present

## 2022-12-22 DIAGNOSIS — Z886 Allergy status to analgesic agent status: Secondary | ICD-10-CM

## 2022-12-22 DIAGNOSIS — Z8249 Family history of ischemic heart disease and other diseases of the circulatory system: Secondary | ICD-10-CM

## 2022-12-22 DIAGNOSIS — M40202 Unspecified kyphosis, cervical region: Secondary | ICD-10-CM | POA: Diagnosis not present

## 2022-12-22 DIAGNOSIS — R4189 Other symptoms and signs involving cognitive functions and awareness: Secondary | ICD-10-CM | POA: Diagnosis present

## 2022-12-22 DIAGNOSIS — K862 Cyst of pancreas: Secondary | ICD-10-CM | POA: Diagnosis present

## 2022-12-22 DIAGNOSIS — M25551 Pain in right hip: Secondary | ICD-10-CM | POA: Diagnosis not present

## 2022-12-22 DIAGNOSIS — S32592D Other specified fracture of left pubis, subsequent encounter for fracture with routine healing: Secondary | ICD-10-CM | POA: Diagnosis not present

## 2022-12-22 DIAGNOSIS — I110 Hypertensive heart disease with heart failure: Secondary | ICD-10-CM | POA: Diagnosis present

## 2022-12-22 DIAGNOSIS — F17210 Nicotine dependence, cigarettes, uncomplicated: Secondary | ICD-10-CM | POA: Diagnosis present

## 2022-12-22 DIAGNOSIS — M159 Polyosteoarthritis, unspecified: Secondary | ICD-10-CM | POA: Diagnosis not present

## 2022-12-22 DIAGNOSIS — G8929 Other chronic pain: Secondary | ICD-10-CM | POA: Diagnosis present

## 2022-12-22 DIAGNOSIS — W19XXXA Unspecified fall, initial encounter: Principal | ICD-10-CM | POA: Diagnosis present

## 2022-12-22 DIAGNOSIS — F0393 Unspecified dementia, unspecified severity, with mood disturbance: Secondary | ICD-10-CM | POA: Diagnosis present

## 2022-12-22 DIAGNOSIS — Z7401 Bed confinement status: Secondary | ICD-10-CM | POA: Diagnosis not present

## 2022-12-22 DIAGNOSIS — K861 Other chronic pancreatitis: Secondary | ICD-10-CM | POA: Diagnosis present

## 2022-12-22 DIAGNOSIS — T45515A Adverse effect of anticoagulants, initial encounter: Secondary | ICD-10-CM | POA: Diagnosis present

## 2022-12-22 DIAGNOSIS — I680 Cerebral amyloid angiopathy: Secondary | ICD-10-CM | POA: Diagnosis present

## 2022-12-22 DIAGNOSIS — R6889 Other general symptoms and signs: Secondary | ICD-10-CM | POA: Diagnosis not present

## 2022-12-22 DIAGNOSIS — R58 Hemorrhage, not elsewhere classified: Secondary | ICD-10-CM | POA: Diagnosis not present

## 2022-12-22 DIAGNOSIS — K219 Gastro-esophageal reflux disease without esophagitis: Secondary | ICD-10-CM | POA: Diagnosis present

## 2022-12-22 DIAGNOSIS — Z8673 Personal history of transient ischemic attack (TIA), and cerebral infarction without residual deficits: Secondary | ICD-10-CM

## 2022-12-22 DIAGNOSIS — K31811 Angiodysplasia of stomach and duodenum with bleeding: Secondary | ICD-10-CM | POA: Diagnosis not present

## 2022-12-22 DIAGNOSIS — K922 Gastrointestinal hemorrhage, unspecified: Secondary | ICD-10-CM | POA: Diagnosis not present

## 2022-12-22 DIAGNOSIS — M51369 Other intervertebral disc degeneration, lumbar region without mention of lumbar back pain or lower extremity pain: Secondary | ICD-10-CM | POA: Diagnosis not present

## 2022-12-22 DIAGNOSIS — M1990 Unspecified osteoarthritis, unspecified site: Secondary | ICD-10-CM | POA: Diagnosis present

## 2022-12-22 DIAGNOSIS — R339 Retention of urine, unspecified: Secondary | ICD-10-CM | POA: Diagnosis present

## 2022-12-22 DIAGNOSIS — M160 Bilateral primary osteoarthritis of hip: Secondary | ICD-10-CM | POA: Diagnosis not present

## 2022-12-22 DIAGNOSIS — M549 Dorsalgia, unspecified: Secondary | ICD-10-CM | POA: Diagnosis not present

## 2022-12-22 DIAGNOSIS — Z820 Family history of epilepsy and other diseases of the nervous system: Secondary | ICD-10-CM

## 2022-12-22 DIAGNOSIS — S0633AD Contusion and laceration of cerebrum, unspecified, with loss of consciousness status unknown, subsequent encounter: Secondary | ICD-10-CM | POA: Diagnosis not present

## 2022-12-22 DIAGNOSIS — W44F3XA Food entering into or through a natural orifice, initial encounter: Secondary | ICD-10-CM | POA: Diagnosis not present

## 2022-12-22 DIAGNOSIS — S0093XA Contusion of unspecified part of head, initial encounter: Secondary | ICD-10-CM | POA: Diagnosis present

## 2022-12-22 DIAGNOSIS — E876 Hypokalemia: Secondary | ICD-10-CM | POA: Diagnosis not present

## 2022-12-22 DIAGNOSIS — I700 Atherosclerosis of aorta: Secondary | ICD-10-CM | POA: Diagnosis not present

## 2022-12-22 DIAGNOSIS — M542 Cervicalgia: Secondary | ICD-10-CM | POA: Diagnosis not present

## 2022-12-22 DIAGNOSIS — R918 Other nonspecific abnormal finding of lung field: Secondary | ICD-10-CM | POA: Diagnosis not present

## 2022-12-22 DIAGNOSIS — E440 Moderate protein-calorie malnutrition: Secondary | ICD-10-CM | POA: Diagnosis not present

## 2022-12-22 DIAGNOSIS — Z885 Allergy status to narcotic agent status: Secondary | ICD-10-CM

## 2022-12-22 DIAGNOSIS — Z743 Need for continuous supervision: Secondary | ICD-10-CM | POA: Diagnosis not present

## 2022-12-22 DIAGNOSIS — M456 Ankylosing spondylitis lumbar region: Secondary | ICD-10-CM | POA: Diagnosis not present

## 2022-12-22 DIAGNOSIS — S069X0A Unspecified intracranial injury without loss of consciousness, initial encounter: Secondary | ICD-10-CM | POA: Diagnosis not present

## 2022-12-22 DIAGNOSIS — M4855XS Collapsed vertebra, not elsewhere classified, thoracolumbar region, sequela of fracture: Secondary | ICD-10-CM | POA: Diagnosis not present

## 2022-12-22 DIAGNOSIS — Z8661 Personal history of infections of the central nervous system: Secondary | ICD-10-CM

## 2022-12-22 DIAGNOSIS — S32110A Nondisplaced Zone I fracture of sacrum, initial encounter for closed fracture: Secondary | ICD-10-CM | POA: Diagnosis not present

## 2022-12-22 DIAGNOSIS — I10 Essential (primary) hypertension: Secondary | ICD-10-CM | POA: Diagnosis not present

## 2022-12-22 DIAGNOSIS — Z8711 Personal history of peptic ulcer disease: Secondary | ICD-10-CM

## 2022-12-22 DIAGNOSIS — S32009S Unspecified fracture of unspecified lumbar vertebra, sequela: Secondary | ICD-10-CM

## 2022-12-22 DIAGNOSIS — R404 Transient alteration of awareness: Secondary | ICD-10-CM | POA: Diagnosis not present

## 2022-12-22 DIAGNOSIS — D509 Iron deficiency anemia, unspecified: Secondary | ICD-10-CM | POA: Diagnosis not present

## 2022-12-22 DIAGNOSIS — S32591D Other specified fracture of right pubis, subsequent encounter for fracture with routine healing: Secondary | ICD-10-CM | POA: Diagnosis not present

## 2022-12-22 DIAGNOSIS — Z9181 History of falling: Secondary | ICD-10-CM

## 2022-12-22 DIAGNOSIS — K2270 Barrett's esophagus without dysplasia: Secondary | ICD-10-CM | POA: Diagnosis present

## 2022-12-22 LAB — COMPREHENSIVE METABOLIC PANEL
ALT: 30 U/L (ref 0–44)
AST: 27 U/L (ref 15–41)
Albumin: 3.3 g/dL — ABNORMAL LOW (ref 3.5–5.0)
Alkaline Phosphatase: 134 U/L — ABNORMAL HIGH (ref 38–126)
Anion gap: 8 (ref 5–15)
BUN: 22 mg/dL (ref 8–23)
CO2: 31 mmol/L (ref 22–32)
Calcium: 8.9 mg/dL (ref 8.9–10.3)
Chloride: 103 mmol/L (ref 98–111)
Creatinine, Ser: 0.75 mg/dL (ref 0.44–1.00)
GFR, Estimated: >60 mL/min (ref >60)
Glucose, Bld: 118 mg/dL — ABNORMAL HIGH (ref 70–99)
Potassium: 3.3 mmol/L — ABNORMAL LOW (ref 3.5–5.1)
Sodium: 142 mmol/L (ref 135–145)
Total Bilirubin: 0.8 mg/dL (ref 0.3–1.2)
Total Protein: 6.5 g/dL (ref 6.5–8.1)

## 2022-12-22 LAB — CBC WITH DIFFERENTIAL/PLATELET
Abs Immature Granulocytes: 0.04 10*3/uL (ref 0.00–0.07)
Basophils Absolute: 0 10*3/uL (ref 0.0–0.1)
Basophils Relative: 0 %
Eosinophils Absolute: 0.1 10*3/uL (ref 0.0–0.5)
Eosinophils Relative: 0 %
HCT: 41.6 % (ref 36.0–46.0)
Hemoglobin: 13.4 g/dL (ref 12.0–15.0)
Immature Granulocytes: 0 %
Lymphocytes Relative: 9 %
Lymphs Abs: 1.2 10*3/uL (ref 0.7–4.0)
MCH: 29.6 pg (ref 26.0–34.0)
MCHC: 32.2 g/dL (ref 30.0–36.0)
MCV: 91.8 fL (ref 80.0–100.0)
Monocytes Absolute: 0.7 10*3/uL (ref 0.1–1.0)
Monocytes Relative: 5 %
Neutro Abs: 11.3 10*3/uL — ABNORMAL HIGH (ref 1.7–7.7)
Neutrophils Relative %: 86 %
Platelets: 306 10*3/uL (ref 150–400)
RBC: 4.53 MIL/uL (ref 3.87–5.11)
RDW: 14.3 % (ref 11.5–15.5)
WBC: 13.3 10*3/uL — ABNORMAL HIGH (ref 4.0–10.5)
nRBC: 0 % (ref 0.0–0.2)

## 2022-12-22 LAB — PROTIME-INR
INR: 1.6 — ABNORMAL HIGH (ref 0.8–1.2)
Prothrombin Time: 19 s — ABNORMAL HIGH (ref 11.4–15.2)

## 2022-12-22 LAB — CBG MONITORING, ED: Glucose-Capillary: 106 mg/dL — ABNORMAL HIGH (ref 70–99)

## 2022-12-22 LAB — APTT: aPTT: 39 s — ABNORMAL HIGH (ref 24–36)

## 2022-12-22 MED ORDER — SODIUM CHLORIDE 0.9 % IV BOLUS
500.0000 mL | Freq: Once | INTRAVENOUS | Status: AC
  Administered 2022-12-22: 500 mL via INTRAVENOUS

## 2022-12-22 MED ORDER — POLYETHYLENE GLYCOL 3350 17 G PO PACK: 17.0000 g | PACK | Freq: Every day | ORAL | Status: DC | PRN

## 2022-12-22 MED ORDER — ORAL CARE MOUTH RINSE: 15.0000 mL | OROMUCOSAL | Status: DC | PRN

## 2022-12-22 MED ORDER — HYDRALAZINE HCL 20 MG/ML IJ SOLN
10.0000 mg | INTRAMUSCULAR | Status: DC | PRN
  Administered 2022-12-23: 10 mg via INTRAVENOUS
  Filled 2022-12-22: qty 1

## 2022-12-22 MED ORDER — CHLORHEXIDINE GLUCONATE CLOTH 2 % EX PADS
6.0000 | MEDICATED_PAD | Freq: Every day | CUTANEOUS | Status: DC
  Administered 2022-12-22 – 2022-12-24 (×3): 6 via TOPICAL

## 2022-12-22 MED ORDER — LEVETIRACETAM IN NACL 500 MG/100ML IV SOLN
500.0000 mg | Freq: Two times a day (BID) | INTRAVENOUS | Status: DC
  Administered 2022-12-22: 500 mg via INTRAVENOUS
  Filled 2022-12-22 (×2): qty 100

## 2022-12-22 MED ORDER — IOHEXOL 300 MG/ML  SOLN
80.0000 mL | Freq: Once | INTRAMUSCULAR | Status: AC | PRN
  Administered 2022-12-22: 80 mL via INTRAVENOUS

## 2022-12-22 MED ORDER — PROTHROMBIN COMPLEX CONC HUMAN 500 UNITS IV KIT
2102.0000 [IU] | PACK | Status: AC
  Administered 2022-12-22: 2102 [IU] via INTRAVENOUS
  Filled 2022-12-22: qty 2102

## 2022-12-22 MED ORDER — ACETAMINOPHEN 500 MG PO TABS
1000.0000 mg | ORAL_TABLET | Freq: Four times a day (QID) | ORAL | Status: DC
  Administered 2022-12-22 – 2023-01-05 (×41): 1000 mg via ORAL
  Filled 2022-12-22 (×43): qty 2

## 2022-12-22 MED ORDER — OXYCODONE HCL 5 MG PO TABS: 2.5000 mg | ORAL_TABLET | ORAL | Status: DC | PRN

## 2022-12-22 MED ORDER — SODIUM CHLORIDE 0.9 % IV BOLUS
500.0000 mL | Freq: Once | INTRAVENOUS | Status: AC
  Administered 2022-12-23: 500 mL via INTRAVENOUS

## 2022-12-22 MED ORDER — SODIUM CHLORIDE 0.9 % IV SOLN: INTRAVENOUS | Status: DC

## 2022-12-22 MED ORDER — METHOCARBAMOL 500 MG PO TABS
500.0000 mg | ORAL_TABLET | Freq: Three times a day (TID) | ORAL | Status: DC
  Administered 2022-12-23 (×3): 500 mg via ORAL
  Filled 2022-12-22 (×3): qty 1

## 2022-12-22 MED ORDER — HYDROMORPHONE HCL 1 MG/ML IJ SOLN: 0.5000 mg | INTRAMUSCULAR | Status: DC | PRN

## 2022-12-22 MED ORDER — METHOCARBAMOL 1000 MG/10ML IJ SOLN
500.0000 mg | Freq: Three times a day (TID) | INTRAVENOUS | Status: DC
  Administered 2022-12-23: 500 mg via INTRAVENOUS
  Filled 2022-12-22 (×2): qty 5
  Filled 2022-12-22: qty 500

## 2022-12-22 MED ORDER — LEVETIRACETAM IN NACL 500 MG/100ML IV SOLN
500.0000 mg | Freq: Two times a day (BID) | INTRAVENOUS | Status: DC
  Administered 2022-12-22 – 2022-12-24 (×4): 500 mg via INTRAVENOUS
  Filled 2022-12-22 (×3): qty 100

## 2022-12-22 MED ORDER — DOCUSATE SODIUM 100 MG PO CAPS
100.0000 mg | ORAL_CAPSULE | Freq: Two times a day (BID) | ORAL | Status: DC
  Administered 2022-12-23 – 2023-01-05 (×20): 100 mg via ORAL
  Filled 2022-12-22 (×22): qty 1

## 2022-12-22 MED ORDER — METOPROLOL TARTRATE 5 MG/5ML IV SOLN
5.0000 mg | Freq: Four times a day (QID) | INTRAVENOUS | Status: DC | PRN
  Administered 2022-12-27 (×2): 5 mg via INTRAVENOUS
  Filled 2022-12-22 (×3): qty 5

## 2022-12-22 NOTE — ED Notes (Signed)
PT's BP is improving and and lung sounds are clear

## 2022-12-22 NOTE — ED Notes (Signed)
[facility] here at [facility].

## 2022-12-22 NOTE — Progress Notes (Signed)
..  Trauma Event Note    Reason for Call :  Called to bedside by primary RN, pt remains hypotensive 77/systolic after 500cc NS bolus complete. Primary RN reports pt has become progressively more difficult to arouse.  Pt resting on stretcher in NAD, responds to verbal stimuli but does not open eyes, denies pain.  Dr. Dossie Der notifed, VO given for additional 500ccNS bolus. Pt now 106/systolic and does have a room assigned on 4N.  Primary RN will continue to monitor respiratory and neurological status, TRN phone number provided.   Last imported Vital Signs BP (!) 106/53   Pulse 70   Temp 97.7 F (36.5 C) (Oral)   Resp 16   Wt 101 lb (45.8 kg)   SpO2 100%   BMI 17.34 kg/m   Trending CBC Recent Labs    12/22/22 1245  WBC 13.3*  HGB 13.4  HCT 41.6  PLT 306    Trending Coag's Recent Labs    12/22/22 1544  APTT 39*  INR 1.6*    Trending BMET Recent Labs    12/22/22 1245  NA 142  K 3.3*  CL 103  CO2 31  BUN 22  CREATININE 0.75  GLUCOSE 118*      Rilan Eiland Dee  Trauma Response RN  Please call TRN at [phone] for further assistance.

## 2022-12-22 NOTE — H&P (Signed)
Activation and Reason: transfer, fall  Primary Survey: airway intact, breath sounds present bilaterally, pulses intact  [name] is an 72 y.o. female.  HPI: 72 yo female with frequent falls in assisted living. She was found down by family. She complains of right leg and arm pain. She does not remember why she fell.  Past Medical History:  Diagnosis Date   Acute blood loss anemia    Arrhythmia    Arthritis    C. difficile colitis    Cancer (HCC)    COPD (chronic obstructive pulmonary disease) (HCC)    Diverticulosis    Fibromyalgia    GERD (gastroesophageal reflux disease)    GI hemorrhage    H/O: substance abuse (HCC) 2005   narcotic usage due to chronic back pain   Heart murmur    Hypertension    Pancreatic cyst    Pneumococcal meningitis    PONV (postoperative nausea and vomiting)    Renal cyst    Stroke (cerebrum) [facility])     Past Surgical History:  Procedure Laterality Date   ABDOMINAL HYSTERECTOMY     ABDOMINAL SURGERY     APPENDECTOMY     BIOPSY  04/22/2020   Procedure: BIOPSY;  Surgeon: Rachael Fee, MD;  Location: [facility];  Service: Gastroenterology;;   BREAST SURGERY     CHOLECYSTECTOMY N/A 09/30/2012   Procedure: LAPAROSCOPIC CHOLECYSTECTOMY WITH INTRAOPERATIVE CHOLANGIOGRAM;  Surgeon: Wilmon Arms. Corliss Skains, MD;  Location: [facility];  Service: General;  Laterality: N/A;   ESOPHAGOGASTRODUODENOSCOPY (EGD) WITH PROPOFOL N/A 04/22/2020   Procedure: ESOPHAGOGASTRODUODENOSCOPY (EGD) WITH PROPOFOL;  Surgeon: Rachael Fee, MD;  Location: [facility];  Service: Gastroenterology;  Laterality: N/A;   FRACTURE SURGERY     right upper arm   OVARIAN CYST SURGERY      Family History  Problem Relation Age of Onset   Alzheimer's disease Mother    Heart disease Father    Prostate cancer Father     Social History:  reports that she has been smoking cigarettes. She has a 19.6 pack-year smoking history. She has never used smokeless tobacco. She reports that  she does not drink alcohol and does not use drugs.  Allergies:  Allergies  Allergen Reactions   Nsaids Other (See Comments) and Nausea And Vomiting    Stomach ulcers   Tylenol [Acetaminophen] Other (See Comments)    Inflammed Liver   Codeine Nausea And Vomiting   Tolmetin Other (See Comments)    Stomach ulcers   Tramadol Other (See Comments)    GI UPSET  Per ALF    Medications: I have reviewed the patient's current medications.  Results for orders placed or performed during the hospital encounter of 12/22/22 (from the past 48 hour(s))  Comprehensive metabolic panel     Status: Abnormal   Collection Time: 12/22/22 12:45 PM  Result Value Ref Range   Sodium 142 135 - 145 mmol/L   Potassium 3.3 (L) 3.5 - 5.1 mmol/L   Chloride 103 98 - 111 mmol/L   CO2 31 22 - 32 mmol/L   Glucose, Bld 118 (H) 70 - 99 mg/dL    Comment: Glucose reference range applies only to samples taken after fasting for at least 8 hours.   BUN 22 8 - 23 mg/dL   Creatinine, Ser 9.56 0.44 - 1.00 mg/dL   Calcium 8.9 8.9 - 21.3 mg/dL   Total Protein 6.5 6.5 - 8.1 g/dL   Albumin 3.3 (L) 3.5 - 5.0 g/dL   AST  27 15 - 41 U/L   ALT 30 0 - 44 U/L   Alkaline Phosphatase 134 (H) 38 - 126 U/L   Total Bilirubin 0.8 0.3 - 1.2 mg/dL   GFR, Estimated >16 >10 mL/min    Comment: (NOTE) Calculated using the CKD-EPI Creatinine Equation (2021)    Anion gap 8 5 - 15    Comment: Performed at [facility], [address] [place], [place] [address]  CBC with Differential     Status: Abnormal   Collection Time: 12/22/22 12:45 PM  Result Value Ref Range   WBC 13.3 (H) 4.0 - 10.5 K/uL   RBC 4.53 3.87 - 5.11 MIL/uL   Hemoglobin 13.4 12.0 - 15.0 g/dL   HCT 40.9 81.1 - 91.4 %   MCV 91.8 80.0 - 100.0 fL   MCH 29.6 26.0 - 34.0 pg   MCHC 32.2 30.0 - 36.0 g/dL   RDW 78.2 95.6 - 21.3 %   Platelets 306 150 - 400 K/uL   nRBC 0.0 0.0 - 0.2 %   Neutrophils Relative % 86 %   Neutro Abs 11.3 (H) 1.7 - 7.7 K/uL   Lymphocytes Relative 9  %   Lymphs Abs 1.2 0.7 - 4.0 K/uL   Monocytes Relative 5 %   Monocytes Absolute 0.7 0.1 - 1.0 K/uL   Eosinophils Relative 0 %   Eosinophils Absolute 0.1 0.0 - 0.5 K/uL   Basophils Relative 0 %   Basophils Absolute 0.0 0.0 - 0.1 K/uL   Immature Granulocytes 0 %   Abs Immature Granulocytes 0.04 0.00 - 0.07 K/uL    Comment: Performed at [facility], [address], [place], [place] [address]  CBG monitoring, ED     Status: Abnormal   Collection Time: 12/22/22 12:53 PM  Result Value Ref Range   Glucose-Capillary 106 (H) 70 - 99 mg/dL    Comment: Glucose reference range applies only to samples taken after fasting for at least 8 hours.  Protime-INR     Status: Abnormal   Collection Time: 12/22/22  3:44 PM  Result Value Ref Range   Prothrombin Time 19.0 (H) 11.4 - 15.2 seconds   INR 1.6 (H) 0.8 - 1.2    Comment: (NOTE) INR goal varies based on device and disease states. Performed at [facility], [address] [place], [place] [address]   APTT     Status: Abnormal   Collection Time: 12/22/22  3:44 PM  Result Value Ref Range   aPTT 39 (H) 24 - 36 seconds    Comment:        IF BASELINE aPTT IS ELEVATED, SUGGEST PATIENT RISK ASSESSMENT BE USED TO DETERMINE APPROPRIATE ANTICOAGULANT THERAPY. Performed at [facility], [address] [place], [place] [address]     CT ABDOMEN PELVIS W CONTRAST  Result Date: 12/22/2022 CLINICAL DATA:  Abdominal trauma, blunt.  Multiple recent falls. EXAM: CT ABDOMEN AND PELVIS WITH CONTRAST TECHNIQUE: Multidetector CT imaging of the abdomen and pelvis was performed using the standard protocol following bolus administration of intravenous contrast. RADIATION DOSE REDUCTION: This exam was performed according to the departmental dose-optimization program which includes automated exposure control, adjustment of the mA and/or kV according to patient size and/or use of iterative reconstruction technique. CONTRAST:  80mL OMNIPAQUE IOHEXOL 300 MG/ML  SOLN  COMPARISON:  CT chest, abdomen and pelvis 12/09/2022 FINDINGS: Lower chest: No pleural effusion. Partially visualized patchy airspace density within the right middle lobe is new from 12/09/2022. Hepatobiliary: No signs of hepatic injury or  perihepatic hematoma. Status post cholecystectomy. Similar appearance of diffuse intrahepatic bile duct dilatation and common bile duct dilatation. Unchanged compared with the previous exam. Pancreas: No inflammation or significant main duct dilatation. Scattered parenchymal calcifications and intraductal calcifications noted. Small cystic structure within the tail of pancreas measures 8 mm, image 26/2. Formally this measured the same. Spleen: No splenic injury or perisplenic hematoma. Adrenals/Urinary Tract: No signs of adrenal hemorrhage or renal injury. Numerous bilateral kidney cysts are identified which appear unchanged from previous exam. Previously characterized on MR from 12/02/2022 as Bosniak class 1 and 2 kidney cysts. No follow-up imaging of the cysts recommended. No hydronephrosis identified bilaterally. Urinary bladder appears normal. Stomach/Bowel: Stomach is within normal limits. No pathologic dilatation of the large or small bowel loops. Moderate stool burden within the colon with desiccated stool within the rectum. Sigmoid diverticulosis without signs of acute diverticulitis. Vascular/Lymphatic: Aortic atherosclerosis. Patent upper abdominal vascularity. No signs of abdominopelvic adenopathy. Reproductive: Status post hysterectomy.  No adnexal mass. Other: No free fluid. No discrete fluid collections identified. No pneumoperitoneum. Musculoskeletal: -There is a fracture involving the right inferior pubic rami which is new compared with the previous exam,a image 73/2. Nondisplaced right superior pubic rami fracture is also suspected, image 64/2. -Compression fractures are again seen involving the T11, T12, L1, and L3 vertebra. Compared with the previous exam there  is been interval progression of the T12 compression deformity with total loss of vertebral body height of approximately 40%. -New insufficiency fracture involving the right sacral wing is also noted, image 50/2. IMPRESSION: 1. No signs of solid organ injury within the abdomen or pelvis. No evidence for hemoperitoneum. 2. There are new fractures involving the right sacral wing, right inferior pubic rami and right superior pubic rami. 3. New scratch set interval progression of T12 compression deformity. Unchanged T11, L1 and L3 compression fractures. 4. Partially visualized patchy airspace density within the right middle lobe is new from 12/09/2022. This may represent atelectasis or developing pneumonia. 5. Stable appearance of diffuse intrahepatic and common bile duct dilatation status post cholecystectomy. 6. Scattered calcifications within the pancreatic. stable 8 mm cystic structure within the tail of pancreas. See report from MRI dated 12/02/2022 for follow-up recommendations. 7. Sigmoid diverticulosis without signs of acute diverticulitis. 8.  Aortic Atherosclerosis (ICD10-I70.0). Electronically Signed   By: Signa Kell M.D.   On: 12/22/2022 17:06   DG Hip Unilat W or Wo Pelvis 2-3 Views Right  Result Date: 12/22/2022 CLINICAL DATA:  Fall with right-sided hip pain. EXAM: DG HIP (WITH OR WITHOUT PELVIS) 2-3V RIGHT COMPARISON:  CT abdomen and pelvis dated 12/09/2022. FINDINGS: There is the appearance of nondisplaced fractures of the right inferior and superior pubic rami. No definite fracture of the right hip. No joint dislocation. Mild-to-moderate osteoarthritis is seen in both hips. There is diffuse osseous demineralization. IMPRESSION: Likely nondisplaced fractures of the right inferior and superior pubic rami. Recommend CT pelvis without contrast for confirmation of this finding and possible further characterization. Electronically Signed   By: Romona Curls M.D.   On: 12/22/2022 14:23   CT CERVICAL  SPINE WO CONTRAST  Result Date: 12/22/2022 CLINICAL DATA:  Fall with neck pain. EXAM: CT CERVICAL SPINE WITHOUT CONTRAST TECHNIQUE: Multidetector CT imaging of the cervical spine was performed without intravenous contrast. Multiplanar CT image reconstructions were also generated. RADIATION DOSE REDUCTION: This exam was performed according to the departmental dose-optimization program which includes automated exposure control, adjustment of the mA and/or kV according to patient size and/or use  of iterative reconstruction technique. COMPARISON:  CT cervical spine dated 12/09/2022. FINDINGS: Alignment: No traumatic listhesis. Focal kyphosis at C5-6 is unchanged with partial ankylosis at these levels. Skull base and vertebrae: No acute fracture. No primary bone lesion or focal pathologic process. Soft tissues and spinal canal: No prevertebral fluid or swelling. No visible canal hematoma. Disc levels: Moderate to severe multilevel degenerative disc and joint disease. Upper chest: Negative. Other: None. IMPRESSION: No acute osseous injury of the cervical spine. Electronically Signed   By: Romona Curls M.D.   On: 12/22/2022 13:36   CT Chest Wo Contrast  Result Date: 12/22/2022 CLINICAL DATA:  Fall with pain.  Altered mental status. EXAM: CT CHEST WITHOUT CONTRAST TECHNIQUE: Multidetector CT imaging of the chest was performed following the standard protocol without IV contrast. RADIATION DOSE REDUCTION: This exam was performed according to the departmental dose-optimization program which includes automated exposure control, adjustment of the mA and/or kV according to patient size and/or use of iterative reconstruction technique. COMPARISON:  CT chest dated 12/09/2022. FINDINGS: Cardiovascular: Vascular calcifications are seen in the aortic arch. Normal heart size. No pericardial effusion. Mediastinum/Nodes: No enlarged mediastinal or axillary lymph nodes. Thyroid gland, trachea, and esophagus demonstrate no  significant findings. Lungs/Pleura: Mild patchy ground-glass opacities are seen in the right middle lobe. Biapical pleural-parenchymal scarring is noted. No pleural effusion or pneumothorax. Upper Abdomen: No acute abnormality. Musculoskeletal: There is an acute on chronic compression fracture of T12 with 50-75% height loss centrally and 4 mm retropulsion into the central canal, both slightly progressed since 12/09/2022. There is mild hematoma surrounding the vertebral body. A chronic appearing compression fracture of T11 demonstrates 25% height loss centrally and no significant retropulsion into the central canal, unchanged. IMPRESSION: 1. Acute on chronic compression fracture of T12 with 50-75% height loss centrally and 4 mm retropulsion into the central canal, both slightly progressed since 12/09/2022. 2. Mild patchy ground-glass opacities in the right middle lobe may reflect atelectasis or infection. Aortic Atherosclerosis (ICD10-I70.0). Electronically Signed   By: Romona Curls M.D.   On: 12/22/2022 13:32   CT Lumbar Spine Wo Contrast  Result Date: 12/22/2022 CLINICAL DATA:  Fall with back pain. EXAM: CT LUMBAR SPINE WITHOUT CONTRAST TECHNIQUE: Multidetector CT imaging of the lumbar spine was performed without intravenous contrast administration. Multiplanar CT image reconstructions were also generated. RADIATION DOSE REDUCTION: This exam was performed according to the departmental dose-optimization program which includes automated exposure control, adjustment of the mA and/or kV according to patient size and/or use of iterative reconstruction technique. COMPARISON:  CT lumbar spine dated 12/09/2022. FINDINGS: Segmentation: 5 lumbar type vertebrae. Alignment: Normal. Vertebrae: Chronic compression deformities of L1 and L3 with approximately 25% height loss centrally appear unchanged. There is 3 mm retropulsion into the central canal at these levels, unchanged. Ankylosis at L4-L5 is unchanged. Paraspinal  and other soft tissues: Vascular calcifications are seen in the abdominal aorta. Disc levels: Multilevel degenerative disc and joint disease. IMPRESSION: 1. No acute fracture or traumatic malalignment of the lumbar spine. 2. Chronic compression deformities of L1 and L3 appear unchanged. Aortic Atherosclerosis (ICD10-I70.0). Electronically Signed   By: Romona Curls M.D.   On: 12/22/2022 13:27   CT Head Wo Contrast  Result Date: 12/22/2022 CLINICAL DATA:  Delirium.  Multiple recent falls. EXAM: CT HEAD WITHOUT CONTRAST TECHNIQUE: Contiguous axial images were obtained from the base of the skull through the vertex without intravenous contrast. RADIATION DOSE REDUCTION: This exam was performed according to the departmental dose-optimization program which includes  automated exposure control, adjustment of the mA and/or kV according to patient size and/or use of iterative reconstruction technique. COMPARISON:  CT head without contrast 12/09/2022 FINDINGS: Brain: Focal hemorrhagic contusion is present in the left frontal operculum measures up to 9 mm on coronal images. No other focal hemorrhage is present. Mild atrophy and white matter changes are otherwise stable. Deep brain nuclei are within normal limits. The ventricles are of normal size. No other significant extracranial fluid is present. Calcifications are present along the falx. The brainstem and cerebellum are within normal limits. Midline structures are within normal limits. Vascular: Atherosclerotic calcifications are present within the cavernous internal carotid arteries bilaterally. No hyperdense vessel is present. Skull: Calvarium is intact. No focal lytic or blastic lesions are present. No significant extracranial soft tissue lesion is present. Sinuses/Orbits: The paranasal sinuses and mastoid air cells are clear. The globes and orbits are within normal limits. IMPRESSION: 1. Focal hemorrhagic contusion in the left frontal operculum measures up to 9 mm  on coronal images. 2. No other focal hemorrhage or mass effect is present 3. Stable mild atrophy and white matter disease. This likely reflects the sequela of chronic microvascular ischemia. Critical Value/emergent results were called by telephone at the time of interpretation on 12/22/2022 at 12:43 pm to provider M[facility] , who verbally acknowledged these results. Electronically Signed   By: Marin Roberts M.D.   On: 12/22/2022 12:49    ROS  PE Blood pressure 113/69, pulse 72, temperature 97.7 F (36.5 C), temperature source Oral, resp. rate 13, weight 45.8 kg, SpO2 100%. Constitutional: NAD; conversant; no deformities Eyes: Moist conjunctiva; no lid lag; anicteric; PERRL Neck: Trachea midline; no thyromegaly, no cervicalgia Lungs: Normal respiratory effort; no tactile fremitus CV: RRR; no palpable thrills; no pitting edema GI: Abd soft, NT; no palpable hepatosplenomegaly MSK: unable to assess gait; no clubbing/cyanosis Psychiatric: Appropriate affect; alert and oriented x3 Lymphatic: No palpable cervical or axillary lymphadenopathy   Assessment/Plan: 72 yo female with dementia and frequent falls.  Hemorrhagic contusion of cerebrum - consult NSG, keppra, ICU admission, repeat CT head in the morning Anticoagulant use - Kcentra given at [facility] ED. Previous discharge summaries recommended stopping eliquis due to falls. R pubic rami fx - consult ortho, pain control FEN- NPo VTE- SCDs only ID- no issues Dispo- ICU admission for bleed in face of eliquis   I reviewed last 24 h vitals and pain scores, last 48 h intake and output, last 24 h labs and trends, and last 24 h imaging results.  This care required high  level of medical decision making.   De Blanch Kinsinger 12/22/2022, 5:47 PM

## 2022-12-22 NOTE — ED Notes (Signed)
B/P 88/50 MAP 63 Kinsinger MD made aware

## 2022-12-22 NOTE — ED Provider Notes (Signed)
[place] EMERGENCY DEPARTMENT AT [facility] Provider Note  CSN: [id number] Arrival date & time: 12/22/22 1134  Chief Complaint(s) Fall  HPI CHABELY NORBY is a 72 y.o. female with PMH previous CVA, chronic [place], COPD, stage IV decubitus ulcer, CHF, recent hospital discharge on 11/12/2022 for persistent falls, generalized weakness and new pneumonia who presents emergency department for additional falls.  Patient arrives from skilled nursing facility and caregiver returned from vacation who found the patient to have bruising to the right side of the head and associated hematomas with worsening altered mental status.  Hospital discharge summaries does clearly state to stop taking the Eliquis but it appears patient received this medication at 8 PM last night.  Additional history unable to be obtained as the patient is altered.   Past Medical History Past Medical History:  Diagnosis Date   Acute blood loss anemia    Arrhythmia    Arthritis    C. difficile colitis    Cancer (HCC)    COPD (chronic obstructive pulmonary disease) (HCC)    Diverticulosis    Fibromyalgia    GERD (gastroesophageal reflux disease)    GI hemorrhage    H/O: substance abuse (HCC) 2005   narcotic usage due to chronic back pain   Heart murmur    Hypertension    Pancreatic cyst    Pneumococcal meningitis    PONV (postoperative nausea and vomiting)    Renal cyst    Stroke (cerebrum) [facility])    Patient Active Problem List   Diagnosis Date Noted   Generalized weakness 12/10/2022   Fall 12/09/2022   PNA (pneumonia) 12/09/2022   Malnutrition of moderate degree 12/04/2022   Hypokalemia 12/03/2022   Elevated LFTs 12/03/2022   Idiopathic chronic pancreatitis (HCC) 12/03/2022   Dilated cbd, acquired 12/03/2022   Opioid dependence (HCC) 12/02/2022   Transaminasemia 12/02/2022   Pressure ulcer of sacral region, stage 4 (HCC) 06/11/2022   RSV (acute bronchiolitis due to respiratory syncytial  virus) 02/28/2022   Protein-calorie malnutrition, severe 08/03/2020   COPD (chronic obstructive pulmonary disease) (HCC) 08/02/2020   Acute respiratory failure with hypoxia (HCC) 08/01/2020   Severe recurrent major depression with psychotic features (HCC) 07/03/2020   Hallucinations    History of substance abuse (HCC) 05/10/2020   Psychosis, atypical (HCC) 05/09/2020   Shortness of breath 04/20/2020   Chronic diastolic CHF (congestive heart failure) (HCC) 04/20/2020   Nausea & vomiting 04/20/2020   Prolonged QT interval 04/20/2020   COPD with acute exacerbation (HCC) 06/20/2017   DOE (dyspnea on exertion) 06/19/2017   Cervical dystonia 11/27/2016   C. 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   Bacterial meningitis 05/15/2016   Gastrointestinal hemorrhage with melena 05/14/2016   History of narcotic use 05/14/2016   Acute encephalopathy    Fever    Left hemiparesis (HCC)    Generalized OA    Fibromyalgia    Tobacco abuse    Substance abuse (HCC)    Benign essential HTN    Tachycardia    Chronic pain syndrome    Acute blood loss anemia    Leukocytosis    Septic thrombophlebitis of sagittal sinus    Acute respiratory failure (HCC)    Streptococcus pneumoniae meningitis    Streptococcal bacteremia    Meningitis    History of stroke 04/12/2016  Stroke (cerebrum) (HCC) 04/12/2016   Neck pain 04/04/2016   Chronic abdominal pain 02/19/2016   Essential hypertension 02/19/2016   Bacterial conjunctivitis 02/19/2016   Health care maintenance 02/19/2016   Anxiety 05/08/2006   Cigarette smoker 05/08/2006   ASTHMA, UNSPECIFIED 05/08/2006   GERD (gastroesophageal reflux disease) 05/08/2006   CONSTIPATION 05/08/2006   CONVULSIONS, SEIZURES, NOS 05/08/2006   Home Medication(s) Prior to Admission medications    Medication Sig Start Date End Date Taking? Authorizing Provider  albuterol (VENTOLIN HFA) 108 (90 Base) MCG/ACT inhaler Inhale 1-2 puffs into the lungs every 6 (six) hours as needed for wheezing or shortness of breath. 02/28/22   [provider]  ascorbic acid (VITAMIN C) 500 MG tablet Take 500 mg by mouth daily.    [provider]  buprenorphine (SUBUTEX) 8 MG SUBL SL tablet Place 4 mg under the tongue every 6 (six) hours.    [provider]  Cholecalciferol (VITAMIN D-3 PO) Take 1 tablet by mouth 4 (four) times daily.    [provider]  dextromethorphan-guaiFENesin (MUCINEX DM) 30-600 MG 12hr tablet Take 1 tablet by mouth 2 (two) times daily. 12/10/22   Vassie Loll, MD  donepezil (ARICEPT) 10 MG tablet Take 10 mg by mouth daily. 02/15/22   [provider]  fluticasone (FLONASE) 50 MCG/ACT nasal spray Place 1 spray into both nostrils daily. 11/12/22   [provider]  levocetirizine (XYZAL) 5 MG tablet Take 5 mg by mouth daily. 11/22/22   [provider]  lipase/protease/amylase (CREON) 36000 UNITS CPEP capsule Take 1 capsule (36,000 Units total) by mouth 3 (three) times daily before meals. 12/10/22 03/10/23  Vassie Loll, MD  LORazepam (ATIVAN) 0.5 MG tablet Take 0.5 mg by mouth 2 (two) times daily. Do not administer AFTER 3    [provider]  losartan (COZAAR) 100 MG tablet Take 0.5 tablets (50 mg total) by mouth daily. 12/10/22   Vassie Loll, MD  MAGNESIUM CITRATE PO Take 1 capsule by mouth at bedtime.    [provider]  magnesium oxide (MAG-OX) 400 (240 Mg) MG tablet Take 1 tablet by mouth daily. 11/27/22   [provider]  mirtazapine (REMERON) 7.5 MG tablet Take 7.5 mg by mouth at bedtime. 02/20/22   [provider]  Multiple Vitamins-Minerals (THERA-TABS M) TABS Take 1 tablet by mouth daily. 11/27/22   [provider]  naloxone [facility]) nasal spray 4 mg/0.1 mL Place 1 spray into  the nose once. 12/04/21   [provider]  OLANZapine (ZYPREXA) 10 MG tablet Take 1 tablet (10 mg total) by mouth at bedtime. 03/08/22   Sherryll Burger, Pratik D, DO  traZODone (DESYREL) 50 MG tablet Take 50 mg by mouth at bedtime as needed for sleep. 11/27/22   [provider]  TRELEGY ELLIPTA 100-62.5-25 MCG/ACT AEPB Inhale 1 puff into the lungs daily. 02/28/22   [provider]  Past Surgical History Past Surgical History:  Procedure Laterality Date   ABDOMINAL HYSTERECTOMY     ABDOMINAL SURGERY     APPENDECTOMY     BIOPSY  04/22/2020   Procedure: BIOPSY;  Surgeon: Rachael Fee, MD;  Location: [facility];  Service: Gastroenterology;;   BREAST SURGERY     CHOLECYSTECTOMY N/A 09/30/2012   Procedure: LAPAROSCOPIC CHOLECYSTECTOMY WITH INTRAOPERATIVE CHOLANGIOGRAM;  Surgeon: Wilmon Arms. Corliss Skains, MD;  Location: [facility];  Service: General;  Laterality: N/A;   ESOPHAGOGASTRODUODENOSCOPY (EGD) WITH PROPOFOL N/A 04/22/2020   Procedure: ESOPHAGOGASTRODUODENOSCOPY (EGD) WITH PROPOFOL;  Surgeon: Rachael Fee, MD;  Location: [facility];  Service: Gastroenterology;  Laterality: N/A;   FRACTURE SURGERY     right upper arm   OVARIAN CYST SURGERY     Family History Family History  Problem Relation Age of Onset   Alzheimer's disease Mother    Heart disease Father    Prostate cancer Father     Social History Social History   Tobacco Use   Smoking status: Every Day    Current packs/day: 0.40    Average packs/day: 0.4 packs/day for 49.0 years (19.6 ttl pk-yrs)    Types: Cigarettes   Smokeless tobacco: Never  Vaping Use   Vaping status: Never Used  Substance Use Topics   Alcohol use: No   Drug use: No    Comment: Methadone for narcotic use   Allergies Nsaids, Tylenol [acetaminophen], Codeine, Tolmetin, and Tramadol  Review of  Systems Review of Systems  Unable to perform ROS: Mental status change    Physical Exam Vital Signs  I have reviewed the triage vital signs BP (!) 157/79 (BP Location: Left Arm)   Pulse 77   Temp 98.2 F (36.8 C) (Oral)   Resp 16   Wt 45.8 kg   SpO2 95%   BMI 17.34 kg/m   Physical Exam Vitals and nursing note reviewed.  Constitutional:      General: She is not in acute distress.    Appearance: She is well-developed.  HENT:     Head: Normocephalic.     Comments: Bruising to lateral scalp on the right Eyes:     Conjunctiva/sclera: Conjunctivae normal.  Cardiovascular:     Rate and Rhythm: Normal rate and regular rhythm.     Heart sounds: No murmur heard. Pulmonary:     Effort: Pulmonary effort is normal. No respiratory distress.     Breath sounds: Normal breath sounds.  Abdominal:     Palpations: Abdomen is soft.     Tenderness: There is no abdominal tenderness.  Musculoskeletal:        General: Tenderness present. No swelling.     Cervical back: Neck supple.  Skin:    General: Skin is warm and dry.     Capillary Refill: Capillary refill takes less than 2 seconds.  Neurological:     Mental Status: She is alert.  Psychiatric:        Mood and Affect: Mood normal.     ED Results and Treatments Labs (all labs ordered are listed, but only abnormal results are displayed) Labs Reviewed  COMPREHENSIVE METABOLIC PANEL - Abnormal; Notable for the following components:      Result Value   Potassium 3.3 (*)    Glucose, Bld 118 (*)    Albumin 3.3 (*)    Alkaline Phosphatase 134 (*)    All other components within normal limits  CBC WITH DIFFERENTIAL/PLATELET - Abnormal; Notable for the following components:   WBC  13.3 (*)    Neutro Abs 11.3 (*)    All other components within normal limits  CBG MONITORING, ED - Abnormal; Notable for the following components:   Glucose-Capillary 106 (*)    All other components within normal limits  URINALYSIS, ROUTINE W REFLEX  MICROSCOPIC                                                                                                                          Radiology CT CERVICAL SPINE WO CONTRAST  Result Date: 12/22/2022 CLINICAL DATA:  Fall with neck pain. EXAM: CT CERVICAL SPINE WITHOUT CONTRAST TECHNIQUE: Multidetector CT imaging of the cervical spine was performed without intravenous contrast. Multiplanar CT image reconstructions were also generated. RADIATION DOSE REDUCTION: This exam was performed according to the departmental dose-optimization program which includes automated exposure control, adjustment of the mA and/or kV according to patient size and/or use of iterative reconstruction technique. COMPARISON:  CT cervical spine dated 12/09/2022. FINDINGS: Alignment: No traumatic listhesis. Focal kyphosis at C5-6 is unchanged with partial ankylosis at these levels. Skull base and vertebrae: No acute fracture. No primary bone lesion or focal pathologic process. Soft tissues and spinal canal: No prevertebral fluid or swelling. No visible canal hematoma. Disc levels: Moderate to severe multilevel degenerative disc and joint disease. Upper chest: Negative. Other: None. IMPRESSION: No acute osseous injury of the cervical spine. Electronically Signed   By: Romona Curls M.D.   On: 12/22/2022 13:36   CT Chest Wo Contrast  Result Date: 12/22/2022 CLINICAL DATA:  Fall with pain.  Altered mental status. EXAM: CT CHEST WITHOUT CONTRAST TECHNIQUE: Multidetector CT imaging of the chest was performed following the standard protocol without IV contrast. RADIATION DOSE REDUCTION: This exam was performed according to the departmental dose-optimization program which includes automated exposure control, adjustment of the mA and/or kV according to patient size and/or use of iterative reconstruction technique. COMPARISON:  CT chest dated 12/09/2022. FINDINGS: Cardiovascular: Vascular calcifications are seen in the aortic arch. Normal heart  size. No pericardial effusion. Mediastinum/Nodes: No enlarged mediastinal or axillary lymph nodes. Thyroid gland, trachea, and esophagus demonstrate no significant findings. Lungs/Pleura: Mild patchy ground-glass opacities are seen in the right middle lobe. Biapical pleural-parenchymal scarring is noted. No pleural effusion or pneumothorax. Upper Abdomen: No acute abnormality. Musculoskeletal: There is an acute on chronic compression fracture of T12 with 50-75% height loss centrally and 4 mm retropulsion into the central canal, both slightly progressed since 12/09/2022. There is mild hematoma surrounding the vertebral body. A chronic appearing compression fracture of T11 demonstrates 25% height loss centrally and no significant retropulsion into the central canal, unchanged. IMPRESSION: 1. Acute on chronic compression fracture of T12 with 50-75% height loss centrally and 4 mm retropulsion into the central canal, both slightly progressed since 12/09/2022. 2. Mild patchy ground-glass opacities in the right middle lobe may reflect atelectasis or infection. Aortic Atherosclerosis (ICD10-I70.0). Electronically Signed   By: Romona Curls M.D.   On: 12/22/2022 13:32  CT Lumbar Spine Wo Contrast  Result Date: 12/22/2022 CLINICAL DATA:  Fall with back pain. EXAM: CT LUMBAR SPINE WITHOUT CONTRAST TECHNIQUE: Multidetector CT imaging of the lumbar spine was performed without intravenous contrast administration. Multiplanar CT image reconstructions were also generated. RADIATION DOSE REDUCTION: This exam was performed according to the departmental dose-optimization program which includes automated exposure control, adjustment of the mA and/or kV according to patient size and/or use of iterative reconstruction technique. COMPARISON:  CT lumbar spine dated 12/09/2022. FINDINGS: Segmentation: 5 lumbar type vertebrae. Alignment: Normal. Vertebrae: Chronic compression deformities of L1 and L3 with approximately 25% height loss  centrally appear unchanged. There is 3 mm retropulsion into the central canal at these levels, unchanged. Ankylosis at L4-L5 is unchanged. Paraspinal and other soft tissues: Vascular calcifications are seen in the abdominal aorta. Disc levels: Multilevel degenerative disc and joint disease. IMPRESSION: 1. No acute fracture or traumatic malalignment of the lumbar spine. 2. Chronic compression deformities of L1 and L3 appear unchanged. Aortic Atherosclerosis (ICD10-I70.0). Electronically Signed   By: Romona Curls M.D.   On: 12/22/2022 13:27   CT Head Wo Contrast  Result Date: 12/22/2022 CLINICAL DATA:  Delirium.  Multiple recent falls. EXAM: CT HEAD WITHOUT CONTRAST TECHNIQUE: Contiguous axial images were obtained from the base of the skull through the vertex without intravenous contrast. RADIATION DOSE REDUCTION: This exam was performed according to the departmental dose-optimization program which includes automated exposure control, adjustment of the mA and/or kV according to patient size and/or use of iterative reconstruction technique. COMPARISON:  CT head without contrast 12/09/2022 FINDINGS: Brain: Focal hemorrhagic contusion is present in the left frontal operculum measures up to 9 mm on coronal images. No other focal hemorrhage is present. Mild atrophy and white matter changes are otherwise stable. Deep brain nuclei are within normal limits. The ventricles are of normal size. No other significant extracranial fluid is present. Calcifications are present along the falx. The brainstem and cerebellum are within normal limits. Midline structures are within normal limits. Vascular: Atherosclerotic calcifications are present within the cavernous internal carotid arteries bilaterally. No hyperdense vessel is present. Skull: Calvarium is intact. No focal lytic or blastic lesions are present. No significant extracranial soft tissue lesion is present. Sinuses/Orbits: The paranasal sinuses and mastoid air cells are  clear. The globes and orbits are within normal limits. IMPRESSION: 1. Focal hemorrhagic contusion in the left frontal operculum measures up to 9 mm on coronal images. 2. No other focal hemorrhage or mass effect is present 3. Stable mild atrophy and white matter disease. This likely reflects the sequela of chronic microvascular ischemia. Critical Value/emergent results were called by telephone at the time of interpretation on 12/22/2022 at 12:43 pm to provider [facility] , who verbally acknowledged these results. Electronically Signed   By: Marin Roberts M.D.   On: 12/22/2022 12:49    Pertinent labs & imaging results that were available during my care of the patient were reviewed by me and considered in my medical decision making (see MDM for details).  Medications Ordered in ED Medications  levETIRAcetam (KEPPRA) IVPB 500 mg/100 mL premix (has no administration in time range)  prothrombin complex conc human (KCENTRA) IVPB 2,290 Units (has no administration in time range)  Procedures Procedures  (including critical care time)  Medical Decision Making / ED Course   This patient presents to the ED for concern of ***, this involves an extensive number of treatment options, and is a complaint that carries with it a high risk of complications and morbidity.  The differential diagnosis includes ***  MDM: ***   Additional history obtained: -Additional history obtained from *** -External records from outside source obtained and reviewed including: Chart review including previous notes, labs, imaging, consultation notes   Lab Tests: -I ordered, reviewed, and interpreted labs.   The pertinent results include:   Labs Reviewed  COMPREHENSIVE METABOLIC PANEL - Abnormal; Notable for the following components:      Result Value   Potassium 3.3 (*)     Glucose, Bld 118 (*)    Albumin 3.3 (*)    Alkaline Phosphatase 134 (*)    All other components within normal limits  CBC WITH DIFFERENTIAL/PLATELET - Abnormal; Notable for the following components:   WBC 13.3 (*)    Neutro Abs 11.3 (*)    All other components within normal limits  CBG MONITORING, ED - Abnormal; Notable for the following components:   Glucose-Capillary 106 (*)    All other components within normal limits  URINALYSIS, ROUTINE W REFLEX MICROSCOPIC      EKG ***  EKG Interpretation Date/Time:    Ventricular Rate:    PR Interval:    QRS Duration:    QT Interval:    QTC Calculation:   R Axis:      Text Interpretation:           Imaging Studies ordered: I ordered imaging studies including *** I independently visualized and interpreted imaging. I agree with the radiologist interpretation   Medicines ordered and prescription drug management: Meds ordered this encounter  Medications   levETIRAcetam (KEPPRA) IVPB 500 mg/100 mL premix   prothrombin complex conc human (KCENTRA) IVPB 2,290 Units    Administer at a rate of 3 units/kg/minute up to a maximum rate of 8.4 mL/min (~210 units/minute). Consider changing administration time to weight-based on a case-by-case basis (ie life-threatening bleed or lack of IV access sites).    -I have reviewed the patients home medicines and have made adjustments as needed  Critical interventions ***  Consultations Obtained: I requested consultation with the ***,  and discussed lab and imaging findings as well as pertinent plan - they recommend: ***   Cardiac Monitoring: The patient was maintained on a cardiac monitor.  I personally viewed and interpreted the cardiac monitored which showed an underlying rhythm of: ***  Social Determinants of Health:  Factors impacting patients care include: ***   Reevaluation: After the interventions noted above, I reevaluated the patient and found that they have  :{resolved/improved/worsened:[id number]::"improved"}  Co morbidities that complicate the patient evaluation  Past Medical History:  Diagnosis Date   Acute blood loss anemia    Arrhythmia    Arthritis    C. difficile colitis    Cancer (HCC)    COPD (chronic obstructive pulmonary disease) (HCC)    Diverticulosis    Fibromyalgia    GERD (gastroesophageal reflux disease)    GI hemorrhage    H/O: substance abuse (HCC) 2005   narcotic usage due to chronic back pain   Heart murmur    Hypertension    Pancreatic cyst    Pneumococcal meningitis    PONV (postoperative nausea and vomiting)    Renal cyst    Stroke (cerebrum) (HCC)  Dispostion: I considered admission for this patient, ***     Final Clinical Impression(s) / ED Diagnoses Final diagnoses:  None     @PCDICTATION @

## 2022-12-22 NOTE — ED Notes (Signed)
ED TO INPATIENT HANDOFF REPORT  ED Nurse Name and Phone #: Rodney Booze [phone]  S Name/Age/Gender [name] 72 y.o. female Room/Bed: 017C/017C  Code Status   Code Status: Do not attempt resuscitation (DNR) PRE-ARREST INTERVENTIONS DESIRED  Home/SNF/Other Skilled nursing facility/[facility] Patient oriented to: self, place, and situation Is this baseline? No usually A+OX4  Triage Complete: Triage complete  Chief Complaint Focal hemorrhagic contusion of cerebrum [facility]) [S06.33AA]  Triage Note BIB [facility] from [facility] in [place] [phone] (see paperwork at [facility]), here for multiple recent falls, seen here for the same recently, caregiver returned from vacation and concerned about multiple recent falls, unwitnessed, R head bruising and hematomas with associated AMS, takes Eliquis, unknown LOC, pt lethargic, awake, answers questions, follows commands, has some confusion. SPO2 89% RA, placed on O2 2l, and IV NSL started. Mentions increased urination, denies DM.      Allergies Allergies  Allergen Reactions   Nsaids Other (See Comments) and Nausea And Vomiting    Stomach ulcers   Tylenol [Acetaminophen] Other (See Comments)    Inflammed Liver   Codeine Nausea And Vomiting   Tolmetin Other (See Comments)    Stomach ulcers   Tramadol Other (See Comments)    GI UPSET  Per ALF    Level of Care/Admitting Diagnosis ED Disposition     ED Disposition  Admit   Condition  --   Comment  Hospital Area: [facility] [[id number]]  Level of Care: ICU [6]  May admit patient to [facility] or [facility] if equivalent level of care is available:: No  Covid Evaluation: Asymptomatic - no recent exposure (last 10 days) testing not required  Diagnosis: Focal hemorrhagic contusion of cerebrum [facility]) [[id number]]  Admitting Physician: TRAUMA MD [[id number]]  Attending Physician: TRAUMA MD [[id number]]  Certification:: I certify this  patient will need inpatient services for at least 2 midnights  Estimated Length of Stay: 5          B Medical/Surgery History Past Medical History:  Diagnosis Date   Acute blood loss anemia    Arrhythmia    Arthritis    C. difficile colitis    Cancer (HCC)    COPD (chronic obstructive pulmonary disease) (HCC)    Diverticulosis    Fibromyalgia    GERD (gastroesophageal reflux disease)    GI hemorrhage    H/O: substance abuse (HCC) 2005   narcotic usage due to chronic back pain   Heart murmur    Hypertension    Pancreatic cyst    Pneumococcal meningitis    PONV (postoperative nausea and vomiting)    Renal cyst    Stroke (cerebrum) [facility])    Past Surgical History:  Procedure Laterality Date   ABDOMINAL HYSTERECTOMY     ABDOMINAL SURGERY     APPENDECTOMY     BIOPSY  04/22/2020   Procedure: BIOPSY;  Surgeon: Rachael Fee, MD;  Location: [facility];  Service: Gastroenterology;;   BREAST SURGERY     CHOLECYSTECTOMY N/A 09/30/2012   Procedure: LAPAROSCOPIC CHOLECYSTECTOMY WITH INTRAOPERATIVE CHOLANGIOGRAM;  Surgeon: Wilmon Arms. Corliss Skains, MD;  Location: [facility];  Service: General;  Laterality: N/A;   ESOPHAGOGASTRODUODENOSCOPY (EGD) WITH PROPOFOL N/A 04/22/2020   Procedure: ESOPHAGOGASTRODUODENOSCOPY (EGD) WITH PROPOFOL;  Surgeon: Rachael Fee, MD;  Location: [facility];  Service: Gastroenterology;  Laterality: N/A;   FRACTURE SURGERY     right upper arm   OVARIAN CYST SURGERY       A  IV Location/Drains/Wounds Patient Lines/Drains/Airways Status     Active Line/Drains/Airways     Name Placement date Placement time Site Days   Peripheral IV 12/22/22 22 G Right Antecubital 12/22/22  1235  Antecubital  less than 1   Peripheral IV 12/22/22 20 G 1.88" Left Antecubital 12/22/22  1547  Antecubital  less than 1   Wound / Incision (Open or Dehisced) 04/22/20 Other (Comment) Knee Anterior;Right;Left 04/22/20  0846  Knee  974            Intake/Output Last 24  hours  Intake/Output Summary (Last 24 hours) at 12/22/2022 2028 Last data filed at 12/22/2022 1934 Gross per 24 hour  Intake 680.14 ml  Output --  Net 680.14 ml    Labs/Imaging Results for orders placed or performed during the hospital encounter of 12/22/22 (from the past 48 hour(s))  Comprehensive metabolic panel     Status: Abnormal   Collection Time: 12/22/22 12:45 PM  Result Value Ref Range   Sodium 142 135 - 145 mmol/L   Potassium 3.3 (L) 3.5 - 5.1 mmol/L   Chloride 103 98 - 111 mmol/L   CO2 31 22 - 32 mmol/L   Glucose, Bld 118 (H) 70 - 99 mg/dL    Comment: Glucose reference range applies only to samples taken after fasting for at least 8 hours.   BUN 22 8 - 23 mg/dL   Creatinine, Ser 1.61 0.44 - 1.00 mg/dL   Calcium 8.9 8.9 - 09.6 mg/dL   Total Protein 6.5 6.5 - 8.1 g/dL   Albumin 3.3 (L) 3.5 - 5.0 g/dL   AST 27 15 - 41 U/L   ALT 30 0 - 44 U/L   Alkaline Phosphatase 134 (H) 38 - 126 U/L   Total Bilirubin 0.8 0.3 - 1.2 mg/dL   GFR, Estimated >04 >54 mL/min    Comment: (NOTE) Calculated using the CKD-EPI Creatinine Equation (2021)    Anion gap 8 5 - 15    Comment: Performed at [facility], [address], [place], [place] [address]  CBC with Differential     Status: Abnormal   Collection Time: 12/22/22 12:45 PM  Result Value Ref Range   WBC 13.3 (H) 4.0 - 10.5 K/uL   RBC 4.53 3.87 - 5.11 MIL/uL   Hemoglobin 13.4 12.0 - 15.0 g/dL   HCT 91.4 78.2 - 95.6 %   MCV 91.8 80.0 - 100.0 fL   MCH 29.6 26.0 - 34.0 pg   MCHC 32.2 30.0 - 36.0 g/dL   RDW 21.3 08.6 - 57.8 %   Platelets 306 150 - 400 K/uL   nRBC 0.0 0.0 - 0.2 %   Neutrophils Relative % 86 %   Neutro Abs 11.3 (H) 1.7 - 7.7 K/uL   Lymphocytes Relative 9 %   Lymphs Abs 1.2 0.7 - 4.0 K/uL   Monocytes Relative 5 %   Monocytes Absolute 0.7 0.1 - 1.0 K/uL   Eosinophils Relative 0 %   Eosinophils Absolute 0.1 0.0 - 0.5 K/uL   Basophils Relative 0 %   Basophils Absolute 0.0 0.0 - 0.1 K/uL   Immature Granulocytes  0 %   Abs Immature Granulocytes 0.04 0.00 - 0.07 K/uL    Comment: Performed at [facility], [address], [place], [place] [address]  CBG monitoring, ED     Status: Abnormal   Collection Time: 12/22/22 12:53 PM  Result Value Ref Range   Glucose-Capillary 106 (H) 70 - 99 mg/dL    Comment: Glucose reference  range applies only to samples taken after fasting for at least 8 hours.  Protime-INR     Status: Abnormal   Collection Time: 12/22/22  3:44 PM  Result Value Ref Range   Prothrombin Time 19.0 (H) 11.4 - 15.2 seconds   INR 1.6 (H) 0.8 - 1.2    Comment: (NOTE) INR goal varies based on device and disease states. Performed at [facility], [address], [place], [place] [address]   APTT     Status: Abnormal   Collection Time: 12/22/22  3:44 PM  Result Value Ref Range   aPTT 39 (H) 24 - 36 seconds    Comment:        IF BASELINE aPTT IS ELEVATED, SUGGEST PATIENT RISK ASSESSMENT BE USED TO DETERMINE APPROPRIATE ANTICOAGULANT THERAPY. Performed at [facility], [address], [place], [place] [address]    CT ABDOMEN PELVIS W CONTRAST  Result Date: 12/22/2022 CLINICAL DATA:  Abdominal trauma, blunt.  Multiple recent falls. EXAM: CT ABDOMEN AND PELVIS WITH CONTRAST TECHNIQUE: Multidetector CT imaging of the abdomen and pelvis was performed using the standard protocol following bolus administration of intravenous contrast. RADIATION DOSE REDUCTION: This exam was performed according to the departmental dose-optimization program which includes automated exposure control, adjustment of the mA and/or kV according to patient size and/or use of iterative reconstruction technique. CONTRAST:  80mL OMNIPAQUE IOHEXOL 300 MG/ML  SOLN COMPARISON:  CT chest, abdomen and pelvis 12/09/2022 FINDINGS: Lower chest: No pleural effusion. Partially visualized patchy airspace density within the right middle lobe is new from 12/09/2022. Hepatobiliary: No signs of hepatic injury or perihepatic hematoma. Status post  cholecystectomy. Similar appearance of diffuse intrahepatic bile duct dilatation and common bile duct dilatation. Unchanged compared with the previous exam. Pancreas: No inflammation or significant main duct dilatation. Scattered parenchymal calcifications and intraductal calcifications noted. Small cystic structure within the tail of pancreas measures 8 mm, image 26/2. Formally this measured the same. Spleen: No splenic injury or perisplenic hematoma. Adrenals/Urinary Tract: No signs of adrenal hemorrhage or renal injury. Numerous bilateral kidney cysts are identified which appear unchanged from previous exam. Previously characterized on MR from 12/02/2022 as Bosniak class 1 and 2 kidney cysts. No follow-up imaging of the cysts recommended. No hydronephrosis identified bilaterally. Urinary bladder appears normal. Stomach/Bowel: Stomach is within normal limits. No pathologic dilatation of the large or small bowel loops. Moderate stool burden within the colon with desiccated stool within the rectum. Sigmoid diverticulosis without signs of acute diverticulitis. Vascular/Lymphatic: Aortic atherosclerosis. Patent upper abdominal vascularity. No signs of abdominopelvic adenopathy. Reproductive: Status post hysterectomy.  No adnexal mass. Other: No free fluid. No discrete fluid collections identified. No pneumoperitoneum. Musculoskeletal: -There is a fracture involving the right inferior pubic rami which is new compared with the previous exam,a image 73/2. Nondisplaced right superior pubic rami fracture is also suspected, image 64/2. -Compression fractures are again seen involving the T11, T12, L1, and L3 vertebra. Compared with the previous exam there is been interval progression of the T12 compression deformity with total loss of vertebral body height of approximately 40%. -New insufficiency fracture involving the right sacral wing is also noted, image 50/2. IMPRESSION: 1. No signs of solid organ injury within the  abdomen or pelvis. No evidence for hemoperitoneum. 2. There are new fractures involving the right sacral wing, right inferior pubic rami and right superior pubic rami. 3. New scratch set interval progression of T12 compression deformity. Unchanged T11, L1 and L3 compression fractures. 4. Partially visualized patchy airspace density within  the right middle lobe is new from 12/09/2022. This may represent atelectasis or developing pneumonia. 5. Stable appearance of diffuse intrahepatic and common bile duct dilatation status post cholecystectomy. 6. Scattered calcifications within the pancreatic. stable 8 mm cystic structure within the tail of pancreas. See report from MRI dated 12/02/2022 for follow-up recommendations. 7. Sigmoid diverticulosis without signs of acute diverticulitis. 8.  Aortic Atherosclerosis (ICD10-I70.0). Electronically Signed   By: Signa Kell M.D.   On: 12/22/2022 17:06   DG Hip Unilat W or Wo Pelvis 2-3 Views Right  Result Date: 12/22/2022 CLINICAL DATA:  Fall with right-sided hip pain. EXAM: DG HIP (WITH OR WITHOUT PELVIS) 2-3V RIGHT COMPARISON:  CT abdomen and pelvis dated 12/09/2022. FINDINGS: There is the appearance of nondisplaced fractures of the right inferior and superior pubic rami. No definite fracture of the right hip. No joint dislocation. Mild-to-moderate osteoarthritis is seen in both hips. There is diffuse osseous demineralization. IMPRESSION: Likely nondisplaced fractures of the right inferior and superior pubic rami. Recommend CT pelvis without contrast for confirmation of this finding and possible further characterization. Electronically Signed   By: Romona Curls M.D.   On: 12/22/2022 14:23   CT CERVICAL SPINE WO CONTRAST  Result Date: 12/22/2022 CLINICAL DATA:  Fall with neck pain. EXAM: CT CERVICAL SPINE WITHOUT CONTRAST TECHNIQUE: Multidetector CT imaging of the cervical spine was performed without intravenous contrast. Multiplanar CT image reconstructions were  also generated. RADIATION DOSE REDUCTION: This exam was performed according to the departmental dose-optimization program which includes automated exposure control, adjustment of the mA and/or kV according to patient size and/or use of iterative reconstruction technique. COMPARISON:  CT cervical spine dated 12/09/2022. FINDINGS: Alignment: No traumatic listhesis. Focal kyphosis at C5-6 is unchanged with partial ankylosis at these levels. Skull base and vertebrae: No acute fracture. No primary bone lesion or focal pathologic process. Soft tissues and spinal canal: No prevertebral fluid or swelling. No visible canal hematoma. Disc levels: Moderate to severe multilevel degenerative disc and joint disease. Upper chest: Negative. Other: None. IMPRESSION: No acute osseous injury of the cervical spine. Electronically Signed   By: Romona Curls M.D.   On: 12/22/2022 13:36   CT Chest Wo Contrast  Result Date: 12/22/2022 CLINICAL DATA:  Fall with pain.  Altered mental status. EXAM: CT CHEST WITHOUT CONTRAST TECHNIQUE: Multidetector CT imaging of the chest was performed following the standard protocol without IV contrast. RADIATION DOSE REDUCTION: This exam was performed according to the departmental dose-optimization program which includes automated exposure control, adjustment of the mA and/or kV according to patient size and/or use of iterative reconstruction technique. COMPARISON:  CT chest dated 12/09/2022. FINDINGS: Cardiovascular: Vascular calcifications are seen in the aortic arch. Normal heart size. No pericardial effusion. Mediastinum/Nodes: No enlarged mediastinal or axillary lymph nodes. Thyroid gland, trachea, and esophagus demonstrate no significant findings. Lungs/Pleura: Mild patchy ground-glass opacities are seen in the right middle lobe. Biapical pleural-parenchymal scarring is noted. No pleural effusion or pneumothorax. Upper Abdomen: No acute abnormality. Musculoskeletal: There is an acute on chronic  compression fracture of T12 with 50-75% height loss centrally and 4 mm retropulsion into the central canal, both slightly progressed since 12/09/2022. There is mild hematoma surrounding the vertebral body. A chronic appearing compression fracture of T11 demonstrates 25% height loss centrally and no significant retropulsion into the central canal, unchanged. IMPRESSION: 1. Acute on chronic compression fracture of T12 with 50-75% height loss centrally and 4 mm retropulsion into the central canal, both slightly progressed since 12/09/2022. 2. Mild  patchy ground-glass opacities in the right middle lobe may reflect atelectasis or infection. Aortic Atherosclerosis (ICD10-I70.0). Electronically Signed   By: Romona Curls M.D.   On: 12/22/2022 13:32   CT Lumbar Spine Wo Contrast  Result Date: 12/22/2022 CLINICAL DATA:  Fall with back pain. EXAM: CT LUMBAR SPINE WITHOUT CONTRAST TECHNIQUE: Multidetector CT imaging of the lumbar spine was performed without intravenous contrast administration. Multiplanar CT image reconstructions were also generated. RADIATION DOSE REDUCTION: This exam was performed according to the departmental dose-optimization program which includes automated exposure control, adjustment of the mA and/or kV according to patient size and/or use of iterative reconstruction technique. COMPARISON:  CT lumbar spine dated 12/09/2022. FINDINGS: Segmentation: 5 lumbar type vertebrae. Alignment: Normal. Vertebrae: Chronic compression deformities of L1 and L3 with approximately 25% height loss centrally appear unchanged. There is 3 mm retropulsion into the central canal at these levels, unchanged. Ankylosis at L4-L5 is unchanged. Paraspinal and other soft tissues: Vascular calcifications are seen in the abdominal aorta. Disc levels: Multilevel degenerative disc and joint disease. IMPRESSION: 1. No acute fracture or traumatic malalignment of the lumbar spine. 2. Chronic compression deformities of L1 and L3 appear  unchanged. Aortic Atherosclerosis (ICD10-I70.0). Electronically Signed   By: Romona Curls M.D.   On: 12/22/2022 13:27   CT Head Wo Contrast  Result Date: 12/22/2022 CLINICAL DATA:  Delirium.  Multiple recent falls. EXAM: CT HEAD WITHOUT CONTRAST TECHNIQUE: Contiguous axial images were obtained from the base of the skull through the vertex without intravenous contrast. RADIATION DOSE REDUCTION: This exam was performed according to the departmental dose-optimization program which includes automated exposure control, adjustment of the mA and/or kV according to patient size and/or use of iterative reconstruction technique. COMPARISON:  CT head without contrast 12/09/2022 FINDINGS: Brain: Focal hemorrhagic contusion is present in the left frontal operculum measures up to 9 mm on coronal images. No other focal hemorrhage is present. Mild atrophy and white matter changes are otherwise stable. Deep brain nuclei are within normal limits. The ventricles are of normal size. No other significant extracranial fluid is present. Calcifications are present along the falx. The brainstem and cerebellum are within normal limits. Midline structures are within normal limits. Vascular: Atherosclerotic calcifications are present within the cavernous internal carotid arteries bilaterally. No hyperdense vessel is present. Skull: Calvarium is intact. No focal lytic or blastic lesions are present. No significant extracranial soft tissue lesion is present. Sinuses/Orbits: The paranasal sinuses and mastoid air cells are clear. The globes and orbits are within normal limits. IMPRESSION: 1. Focal hemorrhagic contusion in the left frontal operculum measures up to 9 mm on coronal images. 2. No other focal hemorrhage or mass effect is present 3. Stable mild atrophy and white matter disease. This likely reflects the sequela of chronic microvascular ischemia. Critical Value/emergent results were called by telephone at the time of interpretation  on 12/22/2022 at 12:43 pm to provider [facility] , who verbally acknowledged these results. Electronically Signed   By: Marin Roberts M.D.   On: 12/22/2022 12:49    Pending Labs Unresulted Labs (From admission, onward)     Start     Ordered   12/23/22 0500  CBC  Tomorrow morning,   R        12/22/22 1739   12/23/22 0500  Basic metabolic panel  Tomorrow morning,   R        12/22/22 1739   12/22/22 1159  Urinalysis, Routine w reflex microscopic -Urine, Clean Catch  Once,   URGENT  Question:  Specimen Source  Answer:  Urine, Clean Catch   12/22/22 1158            Vitals/Pain Today's Vitals   12/22/22 1915 12/22/22 1930 12/22/22 2002 12/22/22 2010  BP: (!) 89/33 (!) 84/47 (!) 77/47 (!) 96/48  Pulse: 60 (!) 59 60 (!) 57  Resp: 11  16   Temp:   97.7 F (36.5 C)   TempSrc:   Oral   SpO2: 100% 100% 100% 99%  Weight:      PainSc:        Isolation Precautions No active isolations  Medications Medications  levETIRAcetam (KEPPRA) IVPB 500 mg/100 mL premix (0 mg Intravenous Stopped 12/22/22 1445)  acetaminophen (TYLENOL) tablet 1,000 mg (1,000 mg Oral Given 12/22/22 1835)  methocarbamol (ROBAXIN) tablet 500 mg (has no administration in time range)    Or  methocarbamol (ROBAXIN) 500 mg in dextrose 5 % 50 mL IVPB (has no administration in time range)  docusate sodium (COLACE) capsule 100 mg (has no administration in time range)  polyethylene glycol (MIRALAX / GLYCOLAX) packet 17 g (has no administration in time range)  metoprolol tartrate (LOPRESSOR) injection 5 mg (has no administration in time range)  hydrALAZINE (APRESOLINE) injection 10 mg (has no administration in time range)  oxyCODONE (Oxy IR/ROXICODONE) immediate release tablet 2.5 mg (has no administration in time range)  HYDROmorphone (DILAUDID) injection 0.5 mg (has no administration in time range)  levETIRAcetam (KEPPRA) IVPB 500 mg/100 mL premix (0 mg Intravenous Stopped 12/22/22 1855)  0.9 %  sodium  chloride infusion ( Intravenous New Bag/Given 12/22/22 1935)  prothrombin complex conc human (KCENTRA) IVPB 2,102 Units (0 Units Intravenous Stopped 12/22/22 1640)  iohexol (OMNIPAQUE) 300 MG/ML solution 80 mL (80 mLs Intravenous Contrast Given 12/22/22 1559)  sodium chloride 0.9 % bolus 500 mL (0 mLs Intravenous Stopped 12/22/22 1934)  sodium chloride 0.9 % bolus 500 mL (500 mLs Intravenous New Bag/Given 12/22/22 2015)    Mobility walks     Focused Assessments Cardiac Assessment Handoff:  Cardiac Rhythm: Normal sinus rhythm (HR 69) Lab Results  Component Value Date   CKTOTAL 42 12/09/2022   Lab Results  Component Value Date   DDIMER <0.27 04/20/2020   Does the Patient currently have chest pain? No   , Neuro Assessment Handoff:  Swallow screen pass? Yes  Cardiac Rhythm: Normal sinus rhythm (HR 69) NIH Stroke Scale  Dizziness Present: No Headache Present: No Interval: Shift assessment Level of Consciousness (1a.)   : Not alert, but arousable by minor stimulation to obey, answer, or respond LOC Questions (1b. )   : Answers both questions correctly LOC Commands (1c. )   : Performs both tasks correctly Best Gaze (2. )  : Partial gaze palsy Visual (3. )  : Partial hemianopia Facial Palsy (4. )    : Normal symmetrical movements Motor Arm, Left (5a. )   : No drift Motor Arm, Right (5b. ) : No drift Motor Leg, Left (6a. )  : Some effort against gravity Motor Leg, Right (6b. ) : Some effort against gravity Limb Ataxia (7. ): Present in one limb Sensory (8. )  : Normal, no sensory loss Best Language (9. )  : No aphasia Dysarthria (10. ): Normal Extinction/Inattention (11.)   : No Abnormality Complete NIHSS TOTAL: 8     Neuro Assessment: Exceptions to WDL Neuro Checks:   Initial (12/22/22 1200)  Has TPA been given? No If patient is a Neuro Trauma and patient is going  to OR before floor call report to 4N Charge nurse: [phone] or [phone]   R Recommendations:  See Admitting Provider Note  Report given to:   Additional Notes:

## 2022-12-22 NOTE — ED Notes (Signed)
Called receiving nurse in ICU and gave report

## 2022-12-22 NOTE — ED Notes (Signed)
Call made to [facility] and spoke with Earnestine Leys to confirm last dose of eliquis was given at 8 pm last night 12/21/22.

## 2022-12-22 NOTE — ED Notes (Signed)
Pt not in room, pt in CT.

## 2022-12-22 NOTE — ED Notes (Signed)
PT's BP is hypotensive and steady dropping. PT just finished with a 500 bolus and Nd is now on a 75ml continuos infusion.Giving her another 250 ml bolus while contacting her admitting team for further guidance

## 2022-12-22 NOTE — ED Triage Notes (Addendum)
BIB [facility] from [facility] in [place] [phone] (see paperwork at [facility]), here for multiple recent falls, seen here for the same recently, caregiver returned from vacation and concerned about multiple recent falls, unwitnessed, R head bruising and hematomas with associated AMS, takes Eliquis, unknown LOC, pt lethargic, awake, answers questions, follows commands, has some confusion. SPO2 89% RA, placed on O2 2l, and IV NSL started. Mentions increased urination, denies DM.

## 2022-12-23 LAB — CBC WITH DIFFERENTIAL/PLATELET
Abs Immature Granulocytes: 0.06 10*3/uL (ref 0.00–0.07)
Basophils Absolute: 0.1 10*3/uL (ref 0.0–0.1)
Basophils Relative: 1 %
Eosinophils Absolute: 0.2 10*3/uL (ref 0.0–0.5)
Eosinophils Relative: 2 %
HCT: 34.1 % — ABNORMAL LOW (ref 36.0–46.0)
Hemoglobin: 10.8 g/dL — ABNORMAL LOW (ref 12.0–15.0)
Immature Granulocytes: 1 %
Lymphocytes Relative: 21 %
Lymphs Abs: 2 10*3/uL (ref 0.7–4.0)
MCH: 29.8 pg (ref 26.0–34.0)
MCHC: 31.7 g/dL (ref 30.0–36.0)
MCV: 94.2 fL (ref 80.0–100.0)
Monocytes Absolute: 1 10*3/uL (ref 0.1–1.0)
Monocytes Relative: 11 %
Neutro Abs: 6.1 10*3/uL (ref 1.7–7.7)
Neutrophils Relative %: 64 %
Platelets: 232 10*3/uL (ref 150–400)
RBC: 3.62 MIL/uL — ABNORMAL LOW (ref 3.87–5.11)
RDW: 14.6 % (ref 11.5–15.5)
WBC: 9.3 10*3/uL (ref 4.0–10.5)
nRBC: 0 % (ref 0.0–0.2)

## 2022-12-23 LAB — MRSA NEXT GEN BY PCR, NASAL: MRSA by PCR Next Gen: NOT DETECTED

## 2022-12-23 LAB — URINALYSIS, ROUTINE W REFLEX MICROSCOPIC
Bilirubin Urine: NEGATIVE
Glucose, UA: NEGATIVE mg/dL
Hgb urine dipstick: NEGATIVE
Ketones, ur: 5 mg/dL — AB
Leukocytes,Ua: NEGATIVE
Nitrite: NEGATIVE
Protein, ur: NEGATIVE mg/dL
Specific Gravity, Urine: >1.046 — ABNORMAL HIGH (ref 1.005–1.030)
pH: 6 (ref 5.0–8.0)

## 2022-12-23 LAB — BASIC METABOLIC PANEL
Anion gap: 10 (ref 5–15)
BUN: 18 mg/dL (ref 8–23)
CO2: 26 mmol/L (ref 22–32)
Calcium: 8.3 mg/dL — ABNORMAL LOW (ref 8.9–10.3)
Chloride: 111 mmol/L (ref 98–111)
Creatinine, Ser: 0.74 mg/dL (ref 0.44–1.00)
GFR, Estimated: >60 mL/min (ref >60)
Glucose, Bld: 62 mg/dL — ABNORMAL LOW (ref 70–99)
Potassium: 3.4 mmol/L — ABNORMAL LOW (ref 3.5–5.1)
Sodium: 147 mmol/L — ABNORMAL HIGH (ref 135–145)

## 2022-12-23 LAB — GLUCOSE, CAPILLARY
Glucose-Capillary: 52 mg/dL — ABNORMAL LOW (ref 70–99)
Glucose-Capillary: 117 mg/dL — ABNORMAL HIGH (ref 70–99)

## 2022-12-23 MED ORDER — KETOROLAC TROMETHAMINE 15 MG/ML IJ SOLN
15.0000 mg | Freq: Four times a day (QID) | INTRAMUSCULAR | Status: AC
  Administered 2022-12-23 – 2022-12-28 (×19): 15 mg via INTRAVENOUS
  Filled 2022-12-23 (×19): qty 1

## 2022-12-23 MED ORDER — ENOXAPARIN SODIUM 30 MG/0.3ML IJ SOSY
30.0000 mg | PREFILLED_SYRINGE | Freq: Two times a day (BID) | INTRAMUSCULAR | Status: DC
  Administered 2022-12-24 – 2022-12-29 (×11): 30 mg via SUBCUTANEOUS
  Filled 2022-12-23 (×13): qty 0.3

## 2022-12-23 MED ORDER — DEXTROSE 50 % IV SOLN
INTRAVENOUS | Status: AC
  Administered 2022-12-23: 12.5 mL
  Filled 2022-12-23: qty 50

## 2022-12-23 MED ORDER — TAMSULOSIN HCL 0.4 MG PO CAPS
0.4000 mg | ORAL_CAPSULE | Freq: Every day | ORAL | Status: DC
  Administered 2022-12-23 – 2023-01-05 (×12): 0.4 mg via ORAL
  Filled 2022-12-23 (×12): qty 1

## 2022-12-23 MED ORDER — POTASSIUM CHLORIDE 20 MEQ PO PACK
60.0000 meq | PACK | Freq: Once | ORAL | Status: AC
  Administered 2022-12-23: 60 meq via ORAL
  Filled 2022-12-23: qty 3

## 2022-12-23 MED ORDER — OXYCODONE HCL 5 MG PO TABS
2.5000 mg | ORAL_TABLET | ORAL | Status: DC | PRN
  Administered 2022-12-23 (×2): 2.5 mg via ORAL
  Administered 2022-12-30 – 2023-01-03 (×2): 5 mg via ORAL
  Filled 2022-12-23 (×5): qty 1

## 2022-12-23 NOTE — Progress Notes (Addendum)
Upon transfer to 4E, patient stated that she came to [facility] originally with a burgundy suitcase yesterday before the transfer to [facility]. The only belongings in the patients room were clothing in a patient's belongings bag. This RN called patient's son to see if family happened to take the suitcase by chance back home or with them. Voicemail left on son's phone with no answer back.    2047- son called back, he didn't think that patient would have had a suitcase with her since EMS brought her to [facility] to begin with. He will call the facility that she lives at to confirm that is correct.

## 2022-12-23 NOTE — Progress Notes (Signed)
SLP Cancellation Note  Patient Details Name: [name] MRN: [id number] DOB: 1950-06-09   Cancelled treatment:       Reason Eval/Treat Not Completed: Patient's level of consciousness. Pt resting. Will f/u for cognitive eval.    Ariel Dimitri, Riley Nearing 12/23/2022, 1:53 PM

## 2022-12-23 NOTE — TOC CAGE-AID Note (Signed)
Transition of Care [facility]) - CAGE-AID Screening   Patient Details  Name: [name] MRN: [id number] Date of Birth: 01-Nov-1950  Transition of Care [facility]) CM/SW Contact:    Katha Hamming, RN Phone Number: 12/23/2022, 8:23 AM   CAGE-AID Screening:    Have You Ever Felt You Ought to Cut Down on Your Drinking or Drug Use?: No Have People Annoyed You By Critizing Your Drinking Or Drug Use?: No Have You Felt Bad Or Guilty About Your Drinking Or Drug Use?: No Have You Ever Had a Drink or Used Drugs First Thing In The Morning to Steady Your Nerves or to Get Rid of a Hangover?: No CAGE-AID Score: 0  Substance Abuse Education Offered: No

## 2022-12-23 NOTE — Progress Notes (Signed)
Hypotension without tachycardia - appears responsive to IVF.  Starting on third 500cc bolus tonight as hypotension returned around midnight, looks to be responding.  No significant source of blood loss identified on initial trauma workup, will recheck labwork.  Some urinary retention, will send UA.

## 2022-12-23 NOTE — Consult Note (Signed)
Reason for Consult:right hip pain after fall Referring Physician: Trauma MD, Kinsinger  [name] is an 72 y.o. female.  HPI: 72 yo female with frequent falls in assisted living. She was found down by family. She complains of right leg and arm pain. She does not remember why she fell.   Admitted to trauma service due to head contusion  Consulted for findings on CT scan involving right hemipelvis  Past Medical History:  Diagnosis Date   Acute blood loss anemia    Arrhythmia    Arthritis    C. difficile colitis    Cancer (HCC)    COPD (chronic obstructive pulmonary disease) (HCC)    Diverticulosis    Fibromyalgia    GERD (gastroesophageal reflux disease)    GI hemorrhage    H/O: substance abuse (HCC) 2005   narcotic usage due to chronic back pain   Heart murmur    Hypertension    Pancreatic cyst    Pneumococcal meningitis    PONV (postoperative nausea and vomiting)    Renal cyst    Stroke (cerebrum) [facility])     Past Surgical History:  Procedure Laterality Date   ABDOMINAL HYSTERECTOMY     ABDOMINAL SURGERY     APPENDECTOMY     BIOPSY  04/22/2020   Procedure: BIOPSY;  Surgeon: Rachael Fee, MD;  Location: [facility];  Service: Gastroenterology;;   BREAST SURGERY     CHOLECYSTECTOMY N/A 09/30/2012   Procedure: LAPAROSCOPIC CHOLECYSTECTOMY WITH INTRAOPERATIVE CHOLANGIOGRAM;  Surgeon: Wilmon Arms. Corliss Skains, MD;  Location: [facility];  Service: General;  Laterality: N/A;   ESOPHAGOGASTRODUODENOSCOPY (EGD) WITH PROPOFOL N/A 04/22/2020   Procedure: ESOPHAGOGASTRODUODENOSCOPY (EGD) WITH PROPOFOL;  Surgeon: Rachael Fee, MD;  Location: [facility];  Service: Gastroenterology;  Laterality: N/A;   FRACTURE SURGERY     right upper arm   OVARIAN CYST SURGERY      Family History  Problem Relation Age of Onset   Alzheimer's disease Mother    Heart disease Father    Prostate cancer Father     Social History:  reports that she has been smoking cigarettes. She has a 19.6  pack-year smoking history. She has never used smokeless tobacco. She reports that she does not drink alcohol and does not use drugs.  Allergies:  Allergies  Allergen Reactions   Nsaids Other (See Comments) and Nausea And Vomiting    Stomach ulcers   Tylenol [Acetaminophen] Other (See Comments)    Inflammed Liver   Codeine Nausea And Vomiting   Tolmetin Other (See Comments)    Stomach ulcers   Tramadol Other (See Comments)    GI UPSET  Per ALF    Medications: I have reviewed the patient's current medications. Scheduled:  acetaminophen  1,000 mg Oral Q6H   Chlorhexidine Gluconate Cloth  6 each Topical Daily   docusate sodium  100 mg Oral BID   methocarbamol  500 mg Oral Q8H    Results for orders placed or performed during the hospital encounter of 12/22/22 (from the past 24 hour(s))  Comprehensive metabolic panel     Status: Abnormal   Collection Time: 12/22/22 12:45 PM  Result Value Ref Range   Sodium 142 135 - 145 mmol/L   Potassium 3.3 (L) 3.5 - 5.1 mmol/L   Chloride 103 98 - 111 mmol/L   CO2 31 22 - 32 mmol/L   Glucose, Bld 118 (H) 70 - 99 mg/dL   BUN 22 8 - 23 mg/dL   Creatinine, Ser 5.78  0.44 - 1.00 mg/dL   Calcium 8.9 8.9 - 41.3 mg/dL   Total Protein 6.5 6.5 - 8.1 g/dL   Albumin 3.3 (L) 3.5 - 5.0 g/dL   AST 27 15 - 41 U/L   ALT 30 0 - 44 U/L   Alkaline Phosphatase 134 (H) 38 - 126 U/L   Total Bilirubin 0.8 0.3 - 1.2 mg/dL   GFR, Estimated >24 >40 mL/min   Anion gap 8 5 - 15  CBC with Differential     Status: Abnormal   Collection Time: 12/22/22 12:45 PM  Result Value Ref Range   WBC 13.3 (H) 4.0 - 10.5 K/uL   RBC 4.53 3.87 - 5.11 MIL/uL   Hemoglobin 13.4 12.0 - 15.0 g/dL   HCT 10.2 72.5 - 36.6 %   MCV 91.8 80.0 - 100.0 fL   MCH 29.6 26.0 - 34.0 pg   MCHC 32.2 30.0 - 36.0 g/dL   RDW 44.0 34.7 - 42.5 %   Platelets 306 150 - 400 K/uL   nRBC 0.0 0.0 - 0.2 %   Neutrophils Relative % 86 %   Neutro Abs 11.3 (H) 1.7 - 7.7 K/uL   Lymphocytes Relative 9 %    Lymphs Abs 1.2 0.7 - 4.0 K/uL   Monocytes Relative 5 %   Monocytes Absolute 0.7 0.1 - 1.0 K/uL   Eosinophils Relative 0 %   Eosinophils Absolute 0.1 0.0 - 0.5 K/uL   Basophils Relative 0 %   Basophils Absolute 0.0 0.0 - 0.1 K/uL   Immature Granulocytes 0 %   Abs Immature Granulocytes 0.04 0.00 - 0.07 K/uL  CBG monitoring, ED     Status: Abnormal   Collection Time: 12/22/22 12:53 PM  Result Value Ref Range   Glucose-Capillary 106 (H) 70 - 99 mg/dL  Protime-INR     Status: Abnormal   Collection Time: 12/22/22  3:44 PM  Result Value Ref Range   Prothrombin Time 19.0 (H) 11.4 - 15.2 seconds   INR 1.6 (H) 0.8 - 1.2  APTT     Status: Abnormal   Collection Time: 12/22/22  3:44 PM  Result Value Ref Range   aPTT 39 (H) 24 - 36 seconds  MRSA Next Gen by PCR, Nasal     Status: None   Collection Time: 12/22/22 10:33 PM   Specimen: Nasal Mucosa; Nasal Swab  Result Value Ref Range   MRSA by PCR Next Gen NOT DETECTED NOT DETECTED  Urinalysis, Routine w reflex microscopic -Urine, Clean Catch     Status: Abnormal   Collection Time: 12/22/22 11:53 PM  Result Value Ref Range   Color, Urine YELLOW YELLOW   APPearance CLEAR CLEAR   Specific Gravity, Urine >1.046 (H) 1.005 - 1.030   pH 6.0 5.0 - 8.0   Glucose, UA NEGATIVE NEGATIVE mg/dL   Hgb urine dipstick NEGATIVE NEGATIVE   Bilirubin Urine NEGATIVE NEGATIVE   Ketones, ur 5 (A) NEGATIVE mg/dL   Protein, ur NEGATIVE NEGATIVE mg/dL   Nitrite NEGATIVE NEGATIVE   Leukocytes,Ua NEGATIVE NEGATIVE  Basic metabolic panel     Status: Abnormal   Collection Time: 12/23/22  2:37 AM  Result Value Ref Range   Sodium 147 (H) 135 - 145 mmol/L   Potassium 3.4 (L) 3.5 - 5.1 mmol/L   Chloride 111 98 - 111 mmol/L   CO2 26 22 - 32 mmol/L   Glucose, Bld 62 (L) 70 - 99 mg/dL   BUN 18 8 - 23 mg/dL   Creatinine, Ser 9.56  0.44 - 1.00 mg/dL   Calcium 8.3 (L) 8.9 - 10.3 mg/dL   GFR, Estimated >40 >98 mL/min   Anion gap 10 5 - 15  CBC with Differential/Platelet      Status: Abnormal   Collection Time: 12/23/22  2:37 AM  Result Value Ref Range   WBC 9.3 4.0 - 10.5 K/uL   RBC 3.62 (L) 3.87 - 5.11 MIL/uL   Hemoglobin 10.8 (L) 12.0 - 15.0 g/dL   HCT 11.9 (L) 14.7 - 82.9 %   MCV 94.2 80.0 - 100.0 fL   MCH 29.8 26.0 - 34.0 pg   MCHC 31.7 30.0 - 36.0 g/dL   RDW 56.2 13.0 - 86.5 %   Platelets 232 150 - 400 K/uL   nRBC 0.0 0.0 - 0.2 %   Neutrophils Relative % 64 %   Neutro Abs 6.1 1.7 - 7.7 K/uL   Lymphocytes Relative 21 %   Lymphs Abs 2.0 0.7 - 4.0 K/uL   Monocytes Relative 11 %   Monocytes Absolute 1.0 0.1 - 1.0 K/uL   Eosinophils Relative 2 %   Eosinophils Absolute 0.2 0.0 - 0.5 K/uL   Basophils Relative 1 %   Basophils Absolute 0.1 0.0 - 0.1 K/uL   Immature Granulocytes 1 %   Abs Immature Granulocytes 0.06 0.00 - 0.07 K/uL     X-ray: CLINICAL DATA:  Fall with right-sided hip pain.   EXAM: DG HIP (WITH OR WITHOUT PELVIS) 2-3V RIGHT   COMPARISON:  CT abdomen and pelvis dated 12/09/2022.   FINDINGS: There is the appearance of nondisplaced fractures of the right inferior and superior pubic rami. No definite fracture of the right hip. No joint dislocation. Mild-to-moderate osteoarthritis is seen in both hips. There is diffuse osseous demineralization.   IMPRESSION: Likely nondisplaced fractures of the right inferior and superior pubic rami. Recommend CT pelvis without contrast for confirmation of this finding and possible further characterization.     Electronically Signed   By: Romona Curls M.D.  CLINICAL DATA:  Abdominal trauma, blunt.  Multiple recent falls.   EXAM: CT ABDOMEN AND PELVIS WITH CONTRAST   TECHNIQUE: Multidetector CT imaging of the abdomen and pelvis was performed using the standard protocol following bolus administration of intravenous contrast.   RADIATION DOSE REDUCTION: This exam was performed according to the departmental dose-optimization program which includes automated exposure control,  adjustment of the mA and/or kV according to patient size and/or use of iterative reconstruction technique.   CONTRAST:  80mL OMNIPAQUE IOHEXOL 300 MG/ML  SOLN   COMPARISON:  CT chest, abdomen and pelvis 12/09/2022   FINDINGS: Lower chest: No pleural effusion. Partially visualized patchy airspace density within the right middle lobe is new from 12/09/2022.   Hepatobiliary: No signs of hepatic injury or perihepatic hematoma. Status post cholecystectomy. Similar appearance of diffuse intrahepatic bile duct dilatation and common bile duct dilatation. Unchanged compared with the previous exam.   Pancreas: No inflammation or significant main duct dilatation. Scattered parenchymal calcifications and intraductal calcifications noted. Small cystic structure within the tail of pancreas measures 8 mm, image 26/2. Formally this measured the same.   Spleen: No splenic injury or perisplenic hematoma.   Adrenals/Urinary Tract: No signs of adrenal hemorrhage or renal injury. Numerous bilateral kidney cysts are identified which appear unchanged from previous exam. Previously characterized on MR from 12/02/2022 as Bosniak class 1 and 2 kidney cysts. No follow-up imaging of the cysts recommended. No hydronephrosis identified bilaterally. Urinary bladder appears normal.   Stomach/Bowel: Stomach is  within normal limits. No pathologic dilatation of the large or small bowel loops. Moderate stool burden within the colon with desiccated stool within the rectum. Sigmoid diverticulosis without signs of acute diverticulitis.   Vascular/Lymphatic: Aortic atherosclerosis. Patent upper abdominal vascularity. No signs of abdominopelvic adenopathy.   Reproductive: Status post hysterectomy.  No adnexal mass.   Other: No free fluid. No discrete fluid collections identified. No pneumoperitoneum.   Musculoskeletal:   -There is a fracture involving the right inferior pubic rami which is new compared with  the previous exam,a image 73/2. Nondisplaced right superior pubic rami fracture is also suspected, image 64/2.   -Compression fractures are again seen involving the T11, T12, L1, and L3 vertebra. Compared with the previous exam there is been interval progression of the T12 compression deformity with total loss of vertebral body height of approximately 40%.   -New insufficiency fracture involving the right sacral wing is also noted, image 50/2.   IMPRESSION: 1. No signs of solid organ injury within the abdomen or pelvis. No evidence for hemoperitoneum. 2. There are new fractures involving the right sacral wing, right inferior pubic rami and right superior pubic rami. 3. New scratch set interval progression of T12 compression deformity. Unchanged T11, L1 and L3 compression fractures. 4. Partially visualized patchy airspace density within the right middle lobe is new from 12/09/2022. This may represent atelectasis or developing pneumonia. 5. Stable appearance of diffuse intrahepatic and common bile duct dilatation status post cholecystectomy. 6. Scattered calcifications within the pancreatic. stable 8 mm cystic structure within the tail of pancreas. See report from MRI dated 12/02/2022 for follow-up recommendations. 7. Sigmoid diverticulosis without signs of acute diverticulitis. 8.  Aortic Atherosclerosis (ICD10-I70.0).     Electronically Signed   By: Signa Kell M.D.  ROS: As per HPI  Blood pressure (!) 96/47, pulse (!) 50, temperature (!) 96.7 F (35.9 C), temperature source Axillary, resp. rate 10, weight 45.8 kg, SpO2 94%.  Physical Exam: Constitutional: NAD; conversant; no deformities Eyes: Moist conjunctiva; no lid lag; anicteric; PERRL Neck: Trachea midline; no thyromegaly, no cervicalgia Lungs: Normal respiratory effort; no tactile fremitus CV: RRR; no palpable thrills; no pitting edema GI: Abd soft, NT; no palpable hepatosplenomegaly MSK: unable to assess gait;  no clubbing/cyanosis Psychiatric: Appropriate affect; alert and oriented x3 Lymphatic: No palpable cervical or axillary lymphadenopathy  Assessment/Plan: Closed nondisplaced fractures involving the right sacrum and right pubic rami  Plan: Non operative management WBAT RLE with walker Will need PT assessment to determine disposition status though already resides at SNF RTC in 3-4 weeks  [name] 12/23/2022, 7:31 AM

## 2022-12-23 NOTE — NC FL2 (Signed)
[place] MEDICAID FL2 LEVEL OF CARE FORM     IDENTIFICATION  Patient Name: [name] Birthdate: [birth year] Sex: female Admission Date (Current Location): 12/22/2022  [facility] and [place] Number:  [employer] and Address:  [facility], [address] [place], [place] [address]      Provider Number: [id number]  Attending Physician Name and Address:  Md, Trauma, MD  Relative Name and Phone Number:  [name]; Son; [phone]    Current Level of Care: Hospital Recommended Level of Care: Skilled Nursing Facility Prior Approval Number:    Date Approved/Denied:   PASRR Number: [id number]  Discharge Plan: SNF    Current Diagnoses: Patient Active Problem List   Diagnosis Date Noted   Focal hemorrhagic contusion of cerebrum (HCC) 12/22/2022   Generalized weakness 12/10/2022   Fall 12/09/2022   PNA (pneumonia) 12/09/2022   Malnutrition of moderate degree 12/04/2022   Hypokalemia 12/03/2022   Elevated LFTs 12/03/2022   Idiopathic chronic pancreatitis (HCC) 12/03/2022   Dilated cbd, acquired 12/03/2022   Opioid dependence (HCC) 12/02/2022   Transaminasemia 12/02/2022   Pressure ulcer of sacral region, stage 4 (HCC) 06/11/2022   RSV (acute bronchiolitis due to respiratory syncytial virus) 02/28/2022   Protein-calorie malnutrition, severe 08/03/2020   COPD (chronic obstructive pulmonary disease) (HCC) 08/02/2020   Acute respiratory failure with hypoxia (HCC) 08/01/2020   Severe recurrent major depression with psychotic features (HCC) 07/03/2020   Hallucinations    History of substance abuse (HCC) 05/10/2020   Psychosis, atypical (HCC) 05/09/2020   Shortness of breath 04/20/2020   Chronic diastolic CHF (congestive heart failure) (HCC) 04/20/2020   Nausea & vomiting 04/20/2020   Prolonged QT interval 04/20/2020   COPD with acute exacerbation (HCC) 06/20/2017   DOE (dyspnea on exertion) 06/19/2017   Cervical dystonia  11/27/2016   C. 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   Bacterial meningitis 05/15/2016   Gastrointestinal hemorrhage with melena 05/14/2016   History of narcotic use 05/14/2016   Acute encephalopathy    Fever    Left hemiparesis (HCC)    Generalized OA    Fibromyalgia    Tobacco abuse    Substance abuse (HCC)    Benign essential HTN    Tachycardia    Chronic pain syndrome    Acute blood loss anemia    Leukocytosis    Septic thrombophlebitis of sagittal sinus    Acute respiratory failure (HCC)    Streptococcus pneumoniae meningitis    Streptococcal bacteremia    Meningitis    History of stroke 04/12/2016   Stroke (cerebrum) (HCC) 04/12/2016   Neck pain 04/04/2016   Chronic abdominal pain 02/19/2016   Essential hypertension 02/19/2016   Bacterial conjunctivitis 02/19/2016   Health care maintenance 02/19/2016   Anxiety 05/08/2006   Cigarette smoker 05/08/2006   ASTHMA, UNSPECIFIED 05/08/2006   GERD (gastroesophageal reflux disease) 05/08/2006   CONSTIPATION 05/08/2006   CONVULSIONS, SEIZURES, NOS 05/08/2006    Orientation RESPIRATION BLADDER Height & Weight     Self, Time  O2 (3L) External catheter, Continent Weight:  (bed scale not weighing) Height:     BEHAVIORAL SYMPTOMS/MOOD NEUROLOGICAL BOWEL NUTRITION STATUS      Continent Diet  AMBULATORY STATUS COMMUNICATION OF NEEDS Skin   Extensive Assist Verbally Normal  Personal Care Assistance Level of Assistance  Bathing, Feeding, Dressing Bathing Assistance: Maximum assistance Feeding assistance: Maximum assistance Dressing Assistance: Maximum assistance     Functional Limitations Info  Sight Sight Info: Impaired        SPECIAL CARE FACTORS FREQUENCY  PT (By licensed PT), OT (By licensed OT)     PT  Frequency: 5x OT Frequency: 5x            Contractures Contractures Info: Not present    Additional Factors Info  Code Status, Allergies ((DNR) PRE-ARREST INTERVENTIONS DESIRED; Allergies to NSAIDS; Tylenol (acetaminophen), Codeine, Tolmetin, Tramadol) Code Status Info: (DNR) PRE-ARREST INTERVENTIONS DESIRED Allergies Info: NSAIDS; Tylenol (acetaminophen); Codeine; Tolmetin; Tramadol           Current Medications (12/23/2022):  This is the current hospital active medication list Current Facility-Administered Medications  Medication Dose Route Frequency Provider Last Rate Last Admin   acetaminophen (TYLENOL) tablet 1,000 mg  1,000 mg Oral Q6H Kinsinger, De Blanch, MD   1,000 mg at 12/23/22 1324   Chlorhexidine Gluconate Cloth 2 % PADS 6 each  6 each Topical Daily Stechschulte, Hyman Hopes, MD   6 each at 12/23/22 1000   docusate sodium (COLACE) capsule 100 mg  100 mg Oral BID Kinsinger, De Blanch, MD       [START ON 12/24/2022] enoxaparin (LOVENOX) injection 30 mg  30 mg Subcutaneous Q12H Diamantina Monks, MD       hydrALAZINE (APRESOLINE) injection 10 mg  10 mg Intravenous Q2H PRN Kinsinger, De Blanch, MD       ketorolac (TORADOL) 15 MG/ML injection 15 mg  15 mg Intravenous Q6H Diamantina Monks, MD   15 mg at 12/23/22 1342   levETIRAcetam (KEPPRA) IVPB 500 mg/100 mL premix  500 mg Intravenous Q12H Kinsinger, De Blanch, MD   Stopped at 12/23/22 0549   methocarbamol (ROBAXIN) tablet 500 mg  500 mg Oral Q8H Kinsinger, De Blanch, MD   500 mg at 12/23/22 1054   Or   methocarbamol (ROBAXIN) 500 mg in dextrose 5 % 50 mL IVPB  500 mg Intravenous Q8H Kinsinger, De Blanch, MD   Stopped at 12/23/22 0313   metoprolol tartrate (LOPRESSOR) injection 5 mg  5 mg Intravenous Q6H PRN Kinsinger, De Blanch, MD       Oral care mouth rinse  15 mL Mouth Rinse PRN Stechschulte, Hyman Hopes, MD       oxyCODONE (Oxy IR/ROXICODONE) immediate release tablet 2.5-5 mg  2.5-5 mg Oral Q4H PRN Diamantina Monks, MD   5  mg at 12/23/22 1054   polyethylene glycol (MIRALAX / GLYCOLAX) packet 17 g  17 g Oral Daily PRN Kinsinger, De Blanch, MD       tamsulosin [facility]) capsule 0.4 mg  0.4 mg Oral Daily Diamantina Monks, MD   0.4 mg at 12/23/22 1054     Discharge Medications: Please see discharge summary for a list of discharge medications.  Relevant Imaging Results:  Relevant Lab Results:   Additional Information SSN: [id number]  Canden Cieslinski E Wilman Tucker, LCSW

## 2022-12-23 NOTE — Evaluation (Signed)
Physical Therapy Evaluation Patient Details Name: [name] MRN: [id number] DOB: 1950/10/17 Today's Date: 12/23/2022  History of Present Illness  [name] is a 72 y.o. female with medical history significant for stroke, chronic pancreatitis, COPD, stage IV decubitus ulcer, diastolic CHF.  Patient presented to the ED from assisted living facility with reports of 2 falls today, and generalized weakness.  Patient tells me she felt weak all over and this resulted in the fall.  No difficulty breathing.  No cough.  No chest pain.  No dizziness.  No vomiting.  No loose stools.  Reports urinary symptoms of frequency and dysuria.  She denies problems with swallowing  She reports chronic unchanged low back pain, chronic unchanged abdominal pain related to her chronic pancreatitis.     Recently hospitalized 9/22 to 9/26 for COPD exacerbation, hypoxic and hypercarbic respiratory failure, also had elevated LFTs, she was to follow-up outpatient for EUS.  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  Clinical Impression  Pt admitted with above diagnosis. Pt from ALF where she reports falling more than 15 times and she expresses frustration at not knowing why she keeps falling. Pt lethargic on eval, needed constant stimulation to stay awake. Pt needed mod A to come to EOB and max A to stand due to RLE pain and weakness. Max A needed to take sidesteps. Did not transfer to chair due to lethargy. Patient will benefit from continued inpatient follow up therapy, <3 hours/day.  Pt currently with functional limitations due to the deficits listed below (see PT Problem List). Pt will benefit from acute skilled PT to increase their independence and safety with mobility to allow discharge.           If plan is discharge home, recommend the following: Help with stairs or ramp for entrance;Assistance with cooking/housework;A lot of help with walking  and/or transfers;A lot of help with bathing/dressing/bathroom;Direct supervision/assist for medications management;Direct supervision/assist for financial management;Assist for transportation;Supervision due to cognitive status   Can travel by private vehicle   No    Equipment Recommendations None recommended by PT  Recommendations for Other Services  OT consult;Speech consult    Functional Status Assessment Patient has had a recent decline in their functional status and demonstrates the ability to make significant improvements in function in a reasonable and predictable amount of time.     Precautions / Restrictions Precautions Precautions: Fall Restrictions Weight Bearing Restrictions: Yes RLE Weight Bearing: Weight bearing as tolerated      Mobility  Bed Mobility Overal bed mobility: Needs Assistance Bed Mobility: Supine to Sit, Sit to Supine     Supine to sit: Max assist Sit to supine: Max assist   General bed mobility comments: pt able to scoot LLE to EOB but needed assist with R as well as max A for elevation of trunk into sitting. Dependent for return to supine    Transfers Overall transfer level: Needs assistance Equipment used: None, Rolling walker (2 wheels) Transfers: Sit to/from Stand Sit to Stand: Mod assist           General transfer comment: pt with posterior bias in standing, pain RLE, mod A for power up    Ambulation/Gait Ambulation/Gait assistance: Max assist Gait Distance (Feet): 2 Feet Assistive device: 1 person hand held assist Gait Pattern/deviations: Decreased step length - left, Decreased stance time - right, Decreased stride length, Trunk flexed, Decreased weight shift to right Gait velocity:  decreased Gait velocity interpretation: <1.31 ft/sec, indicative of household ambulator   General Gait Details: needed max A to step RLE and then shift wt to R to step L. DId not go to chair due to pt lethargy  Stairs            Wheelchair  Mobility     Tilt Bed    Modified Rankin (Stroke Patients Only)       Balance Overall balance assessment: Needs assistance Sitting-balance support: Feet supported, No upper extremity supported Sitting balance-Leahy Scale: Fair Sitting balance - Comments: mild L lean Postural control: Posterior lean Standing balance support: During functional activity, Bilateral upper extremity supported Standing balance-Leahy Scale: Poor Standing balance comment: max A to maintain standing                             Pertinent Vitals/Pain Pain Assessment Pain Assessment: Faces Faces Pain Scale: Hurts even more Pain Location: back and RLE Pain Descriptors / Indicators: Aching, Discomfort Pain Intervention(s): Limited activity within patient's tolerance, Monitored during session, Repositioned    Home Living Family/patient expects to be discharged to:: Assisted living                 Home Equipment: Agricultural consultant (2 wheels);Cane - single point;Wheelchair - manual      Prior Function Prior Level of Function : Needs assist       Physical Assist : Mobility (physical);ADLs (physical) Mobility (physical): Bed mobility;Transfers;Gait;Stairs   Mobility Comments: pt reports she walked without AD until she started falling and has recently been using rollator but continues to fall ADLs Comments: assisted by ALF staff     Extremity/Trunk Assessment   Upper Extremity Assessment Upper Extremity Assessment: Defer to OT evaluation;Generalized weakness    Lower Extremity Assessment Lower Extremity Assessment: Generalized weakness;RLE deficits/detail RLE Deficits / Details: RLE weaker than L in part due to R pelvic pain with mvmt of RLE. hip flex 2-/5, knee ext 3/5 RLE: Unable to fully assess due to pain RLE Sensation: WNL RLE Coordination: decreased gross motor    Cervical / Trunk Assessment Cervical / Trunk Assessment: Kyphotic  Communication    Communication Communication: No apparent difficulties Cueing Techniques: Verbal cues  Cognition Arousal: Lethargic Behavior During Therapy: Flat affect Overall Cognitive Status: Impaired/Different from baseline Area of Impairment: Orientation, Attention, Memory, Following commands, Safety/judgement, Awareness, Problem solving                 Orientation Level: Disoriented to, Situation, Time Current Attention Level: Focused Memory: Decreased short-term memory Following Commands: Follows one step commands consistently, Follows one step commands with increased time   Awareness: Intellectual Problem Solving: Slow processing, Requires verbal cues General Comments: pt lethargic throughout session, needed constant stimulation to keep eyes open. Oriented to self and location and year and month, not day, date, or time. Can give a general history of recent events but no specifics.        General Comments General comments (skin integrity, edema, etc.): BP 177/89, HR 62 bpm, SPO2 98% on RA    Exercises     Assessment/Plan    PT Assessment Patient needs continued PT services  PT Problem List Decreased strength;Decreased activity tolerance;Decreased balance;Decreased mobility;Decreased coordination;Decreased cognition;Decreased safety awareness;Decreased knowledge of precautions;Decreased knowledge of use of DME;Pain       PT Treatment Interventions DME instruction;Gait training;Stair training;Functional mobility training;Therapeutic activities;Therapeutic exercise;Patient/family education;Balance training;Neuromuscular re-education;Cognitive remediation    PT Goals (Current goals  can be found in the Care Plan section)  Acute Rehab PT Goals Patient Stated Goal: figure out why she keeps falling PT Goal Formulation: With patient Time For Goal Achievement: 01/06/23 Potential to Achieve Goals: Fair    Frequency Min 1X/week     Co-evaluation               AM-PAC PT "6  Clicks" Mobility  Outcome Measure Help needed turning from your back to your side while in a flat bed without using bedrails?: A Lot Help needed moving from lying on your back to sitting on the side of a flat bed without using bedrails?: A Lot Help needed moving to and from a bed to a chair (including a wheelchair)?: A Lot Help needed standing up from a chair using your arms (e.g., wheelchair or bedside chair)?: A Lot Help needed to walk in hospital room?: Total Help needed climbing 3-5 steps with a railing? : Total 6 Click Score: 10    End of Session Equipment Utilized During Treatment: Oxygen Activity Tolerance: Patient limited by lethargy Patient left: with call bell/phone within reach;in bed;with bed alarm set Nurse Communication: Mobility status PT Visit Diagnosis: Unsteadiness on feet (R26.81);Other abnormalities of gait and mobility (R26.89);Muscle weakness (generalized) (M62.81);Repeated falls (R29.6);Difficulty in walking, not elsewhere classified (R26.2);Pain Pain - Right/Left: Right Pain - part of body: Hip;Leg (back)    Time: 1110-1145 PT Time Calculation (min) (ACUTE ONLY): 35 min   Charges:   PT Evaluation $PT Eval Moderate Complexity: 1 Mod PT Treatments $Therapeutic Activity: 8-22 mins PT General Charges $$ ACUTE PT VISIT: 1 Visit         Lyanne Co, PT  Acute Rehab Services Secure chat preferred Office [phone]   Lawana Chambers Asheton Scheffler 12/23/2022, 11:56 AM

## 2022-12-23 NOTE — Progress Notes (Signed)
Trauma/Critical Care Follow Up Note  Subjective:    Overnight Issues:   Objective:  Vital signs for last 24 hours: Temp:  [96.7 F (35.9 C)-98.2 F (36.8 C)] 97.2 F (36.2 C) (10/14 0800) Pulse Rate:  [47-77] 66 (10/14 0900) Resp:  [9-20] 14 (10/14 0900) BP: (75-177)/(33-89) 159/88 (10/14 0900) SpO2:  [85 %-100 %] 95 % (10/14 0900) FiO2 (%):  [100 %] 100 % (10/13 1638) Weight:  [45.8 kg] 45.8 kg (10/13 1153)  Hemodynamic parameters for last 24 hours:    Intake/Output from previous day: 10/13 0701 - 10/14 0700 In: 2755.5 [I.V.:826; IV Piggyback:1929.5] Out: 355 [Urine:355]  Intake/Output this shift: Total I/O In: -  Out: 300 [Urine:300]  Vent settings for last 24 hours: FiO2 (%):  [100 %] 100 %  Physical Exam:  Gen: comfortable, no distress Neuro: follows commands, alert, communicative HEENT: PERRL Neck: supple CV: RRR Pulm: unlabored breathing on [place] Abd: soft, NT    GU: urine clear and yellow, s/p I/O Extr: wwp, no edema  Results for orders placed or performed during the hospital encounter of 12/22/22 (from the past 24 hour(s))  Comprehensive metabolic panel     Status: Abnormal   Collection Time: 12/22/22 12:45 PM  Result Value Ref Range   Sodium 142 135 - 145 mmol/L   Potassium 3.3 (L) 3.5 - 5.1 mmol/L   Chloride 103 98 - 111 mmol/L   CO2 31 22 - 32 mmol/L   Glucose, Bld 118 (H) 70 - 99 mg/dL   BUN 22 8 - 23 mg/dL   Creatinine, Ser 1.30 0.44 - 1.00 mg/dL   Calcium 8.9 8.9 - 86.5 mg/dL   Total Protein 6.5 6.5 - 8.1 g/dL   Albumin 3.3 (L) 3.5 - 5.0 g/dL   AST 27 15 - 41 U/L   ALT 30 0 - 44 U/L   Alkaline Phosphatase 134 (H) 38 - 126 U/L   Total Bilirubin 0.8 0.3 - 1.2 mg/dL   GFR, Estimated >78 >46 mL/min   Anion gap 8 5 - 15  CBC with Differential     Status: Abnormal   Collection Time: 12/22/22 12:45 PM  Result Value Ref Range   WBC 13.3 (H) 4.0 - 10.5 K/uL   RBC 4.53 3.87 - 5.11 MIL/uL   Hemoglobin 13.4 12.0 - 15.0 g/dL   HCT 96.2 95.2 -  84.1 %   MCV 91.8 80.0 - 100.0 fL   MCH 29.6 26.0 - 34.0 pg   MCHC 32.2 30.0 - 36.0 g/dL   RDW 32.4 40.1 - 02.7 %   Platelets 306 150 - 400 K/uL   nRBC 0.0 0.0 - 0.2 %   Neutrophils Relative % 86 %   Neutro Abs 11.3 (H) 1.7 - 7.7 K/uL   Lymphocytes Relative 9 %   Lymphs Abs 1.2 0.7 - 4.0 K/uL   Monocytes Relative 5 %   Monocytes Absolute 0.7 0.1 - 1.0 K/uL   Eosinophils Relative 0 %   Eosinophils Absolute 0.1 0.0 - 0.5 K/uL   Basophils Relative 0 %   Basophils Absolute 0.0 0.0 - 0.1 K/uL   Immature Granulocytes 0 %   Abs Immature Granulocytes 0.04 0.00 - 0.07 K/uL  CBG monitoring, ED     Status: Abnormal   Collection Time: 12/22/22 12:53 PM  Result Value Ref Range   Glucose-Capillary 106 (H) 70 - 99 mg/dL  Protime-INR     Status: Abnormal   Collection Time: 12/22/22  3:44 PM  Result Value Ref  Range   Prothrombin Time 19.0 (H) 11.4 - 15.2 seconds   INR 1.6 (H) 0.8 - 1.2  APTT     Status: Abnormal   Collection Time: 12/22/22  3:44 PM  Result Value Ref Range   aPTT 39 (H) 24 - 36 seconds  MRSA Next Gen by PCR, Nasal     Status: None   Collection Time: 12/22/22 10:33 PM   Specimen: Nasal Mucosa; Nasal Swab  Result Value Ref Range   MRSA by PCR Next Gen NOT DETECTED NOT DETECTED  Urinalysis, Routine w reflex microscopic -Urine, Clean Catch     Status: Abnormal   Collection Time: 12/22/22 11:53 PM  Result Value Ref Range   Color, Urine YELLOW YELLOW   APPearance CLEAR CLEAR   Specific Gravity, Urine >1.046 (H) 1.005 - 1.030   pH 6.0 5.0 - 8.0   Glucose, UA NEGATIVE NEGATIVE mg/dL   Hgb urine dipstick NEGATIVE NEGATIVE   Bilirubin Urine NEGATIVE NEGATIVE   Ketones, ur 5 (A) NEGATIVE mg/dL   Protein, ur NEGATIVE NEGATIVE mg/dL   Nitrite NEGATIVE NEGATIVE   Leukocytes,Ua NEGATIVE NEGATIVE  Basic metabolic panel     Status: Abnormal   Collection Time: 12/23/22  2:37 AM  Result Value Ref Range   Sodium 147 (H) 135 - 145 mmol/L   Potassium 3.4 (L) 3.5 - 5.1 mmol/L    Chloride 111 98 - 111 mmol/L   CO2 26 22 - 32 mmol/L   Glucose, Bld 62 (L) 70 - 99 mg/dL   BUN 18 8 - 23 mg/dL   Creatinine, Ser 1.61 0.44 - 1.00 mg/dL   Calcium 8.3 (L) 8.9 - 10.3 mg/dL   GFR, Estimated >09 >60 mL/min   Anion gap 10 5 - 15  CBC with Differential/Platelet     Status: Abnormal   Collection Time: 12/23/22  2:37 AM  Result Value Ref Range   WBC 9.3 4.0 - 10.5 K/uL   RBC 3.62 (L) 3.87 - 5.11 MIL/uL   Hemoglobin 10.8 (L) 12.0 - 15.0 g/dL   HCT 45.4 (L) 09.8 - 11.9 %   MCV 94.2 80.0 - 100.0 fL   MCH 29.8 26.0 - 34.0 pg   MCHC 31.7 30.0 - 36.0 g/dL   RDW 14.7 82.9 - 56.2 %   Platelets 232 150 - 400 K/uL   nRBC 0.0 0.0 - 0.2 %   Neutrophils Relative % 64 %   Neutro Abs 6.1 1.7 - 7.7 K/uL   Lymphocytes Relative 21 %   Lymphs Abs 2.0 0.7 - 4.0 K/uL   Monocytes Relative 11 %   Monocytes Absolute 1.0 0.1 - 1.0 K/uL   Eosinophils Relative 2 %   Eosinophils Absolute 0.2 0.0 - 0.5 K/uL   Basophils Relative 1 %   Basophils Absolute 0.1 0.0 - 0.1 K/uL   Immature Granulocytes 1 %   Abs Immature Granulocytes 0.06 0.00 - 0.07 K/uL  Glucose, capillary     Status: Abnormal   Collection Time: 12/23/22  8:09 AM  Result Value Ref Range   Glucose-Capillary 52 (L) 70 - 99 mg/dL  Glucose, capillary     Status: Abnormal   Collection Time: 12/23/22  8:35 AM  Result Value Ref Range   Glucose-Capillary 117 (H) 70 - 99 mg/dL    Assessment & Plan: The plan of care was discussed with the bedside nurse for the day, Glenna, who is in agreement with this plan and no additional concerns were raised.   Present on Admission:  Focal hemorrhagic contusion of cerebrum (HCC)    LOS: 1 day   Additional comments:I reviewed the patient's new clinical lab test results.   and I reviewed the patients new imaging test results.    34F with dementia and frequent falls   Hemorrhagic contusion of cerebrum - NSGY c/s (Dr. Maurice Small), keppra x7d, SLP for cog eval Acute on chronic T12 compression  fracture - NSGY c/s, Dr. Maurice Small, nonop Anticoagulant use - Kcentra given at [facility] ED. Previous discharge summaries recommended stopping eliquis due to falls. Question per NSGY of cerebral amyloid angiopathy and high risk if AC resumed. Minimum 1w off AC per NSGY.  R pubic rami fx - ortho c/s, Dr. Charlann Boxer, pain control, nonop, WBAT, PT/OT FEN - regular diet VTE- SCDs, LMWH to start in AM ID- no issues Foley - s/p I/O x2, foley is still requiring, start flomax Dispo - 4NP, therapies   Diamantina Monks, MD Trauma & General Surgery Please use [email or web address] to contact on call provider  12/23/2022  *Care during the described time interval was provided by me. I have reviewed this patient's available data, including medical history, events of note, physical examination and test results as part of my evaluation.

## 2022-12-23 NOTE — Consult Note (Signed)
Neurosurgery Consultation  Reason for Consult: Traumatic frontal contusion Referring Physician: Stechschulte  CC: Fall  HPI: This is a 72 y.o. woman w/ h/o multiple significant medical problems that presents after a fall. Today she complains of mainly posterior pelvic / buttock pain, no headache, no new weakness, numbness, or parasthesias. +history of apixiban use prior to fall, given Kcentra @ OSH.    ROS: A 14 point ROS was performed and is negative except as noted in the HPI.   PMHx:  Past Medical History:  Diagnosis Date   Acute blood loss anemia    Arrhythmia    Arthritis    C. difficile colitis    Cancer (HCC)    COPD (chronic obstructive pulmonary disease) (HCC)    Diverticulosis    Fibromyalgia    GERD (gastroesophageal reflux disease)    GI hemorrhage    H/O: substance abuse (HCC) 2005   narcotic usage due to chronic back pain   Heart murmur    Hypertension    Pancreatic cyst    Pneumococcal meningitis    PONV (postoperative nausea and vomiting)    Renal cyst    Stroke (cerebrum) (HCC)    FamHx:  Family History  Problem Relation Age of Onset   Alzheimer's disease Mother    Heart disease Father    Prostate cancer Father    SocHx:  reports that she has been smoking cigarettes. She has a 19.6 pack-year smoking history. She has never used smokeless tobacco. She reports that she does not drink alcohol and does not use drugs.  Exam: Vital signs in last 24 hours: Temp:  [96.7 F (35.9 C)-98.2 F (36.8 C)] 97.2 F (36.2 C) (10/14 0800) Pulse Rate:  [47-77] 73 (10/14 0800) Resp:  [9-20] 13 (10/14 0800) BP: (75-177)/(33-89) 154/68 (10/14 0800) SpO2:  [85 %-100 %] 97 % (10/14 0800) FiO2 (%):  [100 %] 100 % (10/13 1638) Weight:  [45.8 kg] 45.8 kg (10/13 1153) General: Awake, alert, cooperative, lying in bed in NAD Head: Normocephalic, +abrasion in the left superior forehead HEENT: Neck supple Pulmonary: breathing room air comfortably, no evidence of increased  work of breathing Cardiac: RRR Abdomen: S NT ND Extremities: Warm and well perfused x4 Neuro: Aox3 with some mild bilateral lid lag that doesn't cross the pupillary margin, gaze conjugate, PERRL, EOMI, FS Strength 5/5 x4 except some pain limitation proximally, SILTx4   Assessment and Plan: 72 y.o. woman on apixiban s/p fall, reversed with [place]. [facility] personally reviewed, which shows small left frontal contusion.  Repeat study with stable findings. CT L-spine with chronic appearing compression fractures, two with small amount of retropulsion.  -no acute neurosurgical intervention indicated at this time -lumbar fractures appear mostly chronic. The L1 fracture appears different than the study from 2 weeks ago, but I think this is likely healing without clear evidence of a superimposed fracture. Okay for activity as tolerated from my standpoint -from an anticoagulation standpoint, given her history of dementia and the appearance of the contusion, raises the possibility of cerebral amyloid angiopathy. She does have an MRI from 5y ago without any dot burden. But certainly high risk if she resumes. If resumption of anticoagulation is pursued, from a neurosurgery perspective we should hold therapeutic anticoagulation for 1 week, okay for prophylactic dose starting 10/15.  -please call with any concerns or questions  Jadene Pierini, MD 12/23/22 8:29 AM [facility]

## 2022-12-23 NOTE — Progress Notes (Signed)
CBG 52. 12.g G dextrose IV given.

## 2022-12-24 LAB — CBC
HCT: 41.3 % (ref 36.0–46.0)
Hemoglobin: 13.3 g/dL (ref 12.0–15.0)
MCH: 28.6 pg (ref 26.0–34.0)
MCHC: 32.2 g/dL (ref 30.0–36.0)
MCV: 88.8 fL (ref 80.0–100.0)
Platelets: 360 10*3/uL (ref 150–400)
RBC: 4.65 MIL/uL (ref 3.87–5.11)
RDW: 14.2 % (ref 11.5–15.5)
WBC: 14.5 10*3/uL — ABNORMAL HIGH (ref 4.0–10.5)
nRBC: 0 % (ref 0.0–0.2)

## 2022-12-24 LAB — BASIC METABOLIC PANEL
Anion gap: 13 (ref 5–15)
BUN: 13 mg/dL (ref 8–23)
CO2: 23 mmol/L (ref 22–32)
Calcium: 9.5 mg/dL (ref 8.9–10.3)
Chloride: 108 mmol/L (ref 98–111)
Creatinine, Ser: 0.75 mg/dL (ref 0.44–1.00)
GFR, Estimated: >60 mL/min (ref >60)
Glucose, Bld: 115 mg/dL — ABNORMAL HIGH (ref 70–99)
Potassium: 3.6 mmol/L (ref 3.5–5.1)
Sodium: 144 mmol/L (ref 135–145)

## 2022-12-24 LAB — URINALYSIS, ROUTINE W REFLEX MICROSCOPIC
Bilirubin Urine: NEGATIVE
Glucose, UA: NEGATIVE mg/dL
Ketones, ur: 5 mg/dL — AB
Nitrite: NEGATIVE
Protein, ur: NEGATIVE mg/dL
RBC / HPF: >50 RBC/hpf (ref 0–5)
Specific Gravity, Urine: 1.011 (ref 1.005–1.030)
pH: 6 (ref 5.0–8.0)

## 2022-12-24 MED ORDER — METHOCARBAMOL 1000 MG/10ML IJ SOLN
500.0000 mg | Freq: Four times a day (QID) | INTRAVENOUS | Status: DC
  Filled 2022-12-24: qty 5

## 2022-12-24 MED ORDER — LEVOCETIRIZINE DIHYDROCHLORIDE 5 MG PO TABS: 5.0000 mg | ORAL_TABLET | Freq: Every day | ORAL | Status: DC

## 2022-12-24 MED ORDER — FAMOTIDINE 20 MG PO TABS
20.0000 mg | ORAL_TABLET | Freq: Every day | ORAL | Status: DC
  Administered 2022-12-24 – 2023-01-05 (×11): 20 mg via ORAL
  Filled 2022-12-24 (×11): qty 1

## 2022-12-24 MED ORDER — LEVETIRACETAM 500 MG PO TABS
500.0000 mg | ORAL_TABLET | Freq: Two times a day (BID) | ORAL | Status: AC
  Administered 2022-12-24 – 2022-12-29 (×10): 500 mg via ORAL
  Filled 2022-12-24 (×10): qty 1

## 2022-12-24 MED ORDER — ARIPIPRAZOLE 5 MG PO TABS
5.0000 mg | ORAL_TABLET | Freq: Every day | ORAL | Status: DC
  Administered 2022-12-24 – 2023-01-05 (×11): 5 mg via ORAL
  Filled 2022-12-24 (×13): qty 1

## 2022-12-24 MED ORDER — ALBUTEROL SULFATE HFA 108 (90 BASE) MCG/ACT IN AERS: 2.0000 | INHALATION_SPRAY | RESPIRATORY_TRACT | Status: DC | PRN

## 2022-12-24 MED ORDER — LORAZEPAM 0.5 MG PO TABS
0.5000 mg | ORAL_TABLET | Freq: Two times a day (BID) | ORAL | Status: DC
  Administered 2022-12-24 – 2023-01-05 (×21): 0.5 mg via ORAL
  Filled 2022-12-24 (×21): qty 1

## 2022-12-24 MED ORDER — VITAMIN D 25 MCG (1000 UNIT) PO TABS
1000.0000 [IU] | ORAL_TABLET | Freq: Every day | ORAL | Status: DC
  Administered 2022-12-24 – 2023-01-05 (×11): 1000 [IU] via ORAL
  Filled 2022-12-24 (×11): qty 1

## 2022-12-24 MED ORDER — ENSURE ENLIVE PO LIQD
237.0000 mL | Freq: Two times a day (BID) | ORAL | Status: DC
  Administered 2022-12-24 – 2022-12-29 (×4): 237 mL via ORAL

## 2022-12-24 MED ORDER — BUPRENORPHINE HCL 2 MG SL SUBL
4.0000 mg | SUBLINGUAL_TABLET | Freq: Four times a day (QID) | SUBLINGUAL | Status: DC
  Administered 2022-12-24 – 2023-01-05 (×38): 4 mg via SUBLINGUAL
  Filled 2022-12-24 (×39): qty 2

## 2022-12-24 MED ORDER — BOOST / RESOURCE BREEZE PO LIQD CUSTOM
1.0000 | Freq: Three times a day (TID) | ORAL | Status: DC
  Administered 2022-12-24 – 2022-12-30 (×8): 1 via ORAL
  Filled 2022-12-24: qty 1

## 2022-12-24 MED ORDER — VITAMIN C 500 MG PO TABS
500.0000 mg | ORAL_TABLET | Freq: Every day | ORAL | Status: DC
  Administered 2022-12-24 – 2023-01-05 (×11): 500 mg via ORAL
  Filled 2022-12-24 (×11): qty 1

## 2022-12-24 MED ORDER — VITAMIN D3 50 MCG (2000 UT) PO CAPS: 2000.0000 [IU] | ORAL_CAPSULE | Freq: Four times a day (QID) | ORAL | Status: DC

## 2022-12-24 MED ORDER — TRAMADOL HCL 50 MG PO TABS: 75.0000 mg | ORAL_TABLET | Freq: Four times a day (QID) | ORAL | Status: DC

## 2022-12-24 MED ORDER — LORATADINE 10 MG PO TABS
10.0000 mg | ORAL_TABLET | Freq: Every day | ORAL | Status: DC
  Administered 2022-12-24 – 2023-01-05 (×11): 10 mg via ORAL
  Filled 2022-12-24 (×11): qty 1

## 2022-12-24 MED ORDER — IPRATROPIUM BROMIDE 0.03 % NA SOLN: 2.0000 | Freq: Three times a day (TID) | NASAL | Status: DC | PRN

## 2022-12-24 MED ORDER — TEMAZEPAM 7.5 MG PO CAPS: 22.5000 mg | ORAL_CAPSULE | Freq: Every evening | ORAL | Status: DC | PRN

## 2022-12-24 MED ORDER — FLUTICASONE FUROATE-VILANTEROL 100-25 MCG/ACT IN AEPB
1.0000 | INHALATION_SPRAY | Freq: Every day | RESPIRATORY_TRACT | Status: DC
  Administered 2022-12-25 – 2023-01-05 (×10): 1 via RESPIRATORY_TRACT
  Filled 2022-12-24: qty 28

## 2022-12-24 MED ORDER — CIPROFLOXACIN HCL 0.3 % OP SOLN
2.0000 [drp] | OPHTHALMIC | Status: AC
  Administered 2022-12-24 – 2022-12-26 (×9): 2 [drp] via OPHTHALMIC
  Filled 2022-12-24: qty 2.5

## 2022-12-24 MED ORDER — ALBUTEROL SULFATE (2.5 MG/3ML) 0.083% IN NEBU: 2.5000 mg | INHALATION_SOLUTION | RESPIRATORY_TRACT | Status: DC | PRN

## 2022-12-24 MED ORDER — MIRTAZAPINE 15 MG PO TABS
7.5000 mg | ORAL_TABLET | Freq: Every day | ORAL | Status: DC
  Administered 2022-12-24 – 2023-01-04 (×11): 7.5 mg via ORAL
  Filled 2022-12-24 (×11): qty 1

## 2022-12-24 MED ORDER — DOXEPIN HCL 25 MG PO CAPS
50.0000 mg | ORAL_CAPSULE | Freq: Every day | ORAL | Status: DC
  Administered 2022-12-24 – 2023-01-05 (×11): 50 mg via ORAL
  Filled 2022-12-24 (×10): qty 2
  Filled 2022-12-24: qty 1
  Filled 2022-12-24 (×3): qty 2

## 2022-12-24 MED ORDER — TRAZODONE HCL 50 MG PO TABS
25.0000 mg | ORAL_TABLET | Freq: Every day | ORAL | Status: DC
  Administered 2022-12-25 – 2023-01-04 (×10): 25 mg via ORAL
  Filled 2022-12-24 (×10): qty 1

## 2022-12-24 MED ORDER — LOSARTAN POTASSIUM 50 MG PO TABS
100.0000 mg | ORAL_TABLET | Freq: Every day | ORAL | Status: DC
  Administered 2022-12-24 – 2023-01-05 (×10): 100 mg via ORAL
  Filled 2022-12-24 (×11): qty 2

## 2022-12-24 MED ORDER — FLUTICASONE PROPIONATE 50 MCG/ACT NA SUSP: 1.0000 | Freq: Two times a day (BID) | NASAL | Status: DC | PRN

## 2022-12-24 MED ORDER — METHOCARBAMOL 500 MG PO TABS
500.0000 mg | ORAL_TABLET | Freq: Four times a day (QID) | ORAL | Status: DC
  Administered 2022-12-24 – 2023-01-05 (×36): 500 mg via ORAL
  Filled 2022-12-24 (×37): qty 1

## 2022-12-24 MED ORDER — POLYETHYLENE GLYCOL 3350 17 G PO PACK
17.0000 g | PACK | Freq: Every day | ORAL | Status: DC
  Administered 2022-12-24 – 2023-01-05 (×8): 17 g via ORAL
  Filled 2022-12-24 (×9): qty 1

## 2022-12-24 MED ORDER — DONEPEZIL HCL 5 MG PO TABS
10.0000 mg | ORAL_TABLET | Freq: Every day | ORAL | Status: DC
  Administered 2022-12-24 – 2023-01-05 (×11): 10 mg via ORAL
  Filled 2022-12-24 (×11): qty 2

## 2022-12-24 MED ORDER — UMECLIDINIUM BROMIDE 62.5 MCG/ACT IN AEPB
1.0000 | INHALATION_SPRAY | Freq: Every day | RESPIRATORY_TRACT | Status: DC
  Administered 2022-12-25 – 2023-01-05 (×9): 1 via RESPIRATORY_TRACT
  Filled 2022-12-24 (×2): qty 7

## 2022-12-24 MED ORDER — OLANZAPINE 10 MG PO TABS
10.0000 mg | ORAL_TABLET | Freq: Every day | ORAL | Status: DC
  Administered 2022-12-24 – 2023-01-04 (×11): 10 mg via ORAL
  Filled 2022-12-24 (×13): qty 1

## 2022-12-24 MED ORDER — CARVEDILOL 6.25 MG PO TABS
6.2500 mg | ORAL_TABLET | Freq: Two times a day (BID) | ORAL | Status: DC
  Administered 2022-12-24 – 2022-12-26 (×6): 6.25 mg via ORAL
  Filled 2022-12-24 (×6): qty 1

## 2022-12-24 NOTE — TOC Progression Note (Signed)
Transition of Care [facility]) - Progression Note    Patient Details  Name: [name] MRN: [id number] Date of Birth: Nov 13, 1950  Transition of [facility]) CM/SW Contact  Glennon Mac, RN Phone Number: 12/24/2022, 5:03 PM  Clinical Narrative:    Spoke with patient's son, Ethelene Browns, to discuss discharge planning for patient.  He states that PTA, patient resides at [facility] in [place].  We discussed PT/OT recommendations for SNF for short term rehab at discharge, and he agrees with this plan.  He prefers [facility] [place], but is agreeable to patient being faxed out to other facilities in [place].  Faxed out patient for bed search; will provide bed offers as available.     Expected Discharge Plan: Skilled Nursing Facility Barriers to Discharge: Continued Medical Work up  Expected Discharge Plan and Services     Post Acute Care Choice: Skilled Nursing Facility                                         Social Determinants of Health (SDOH) Interventions SDOH Screenings   Food Insecurity: No Food Insecurity (12/09/2022)  Housing: Low Risk  (12/09/2022)  Transportation Needs: No Transportation Needs (12/09/2022)  Utilities: Not At Risk (12/09/2022)  Social Connections: Unknown (07/21/2021)   Received from [facility]  Stress: Stress Concern Present (05/10/2020)   Received from [facility], [facility]  Tobacco Use: High Risk (12/22/2022)    Readmission Risk Interventions    12/03/2022   10:10 AM 03/01/2022   11:53 AM  Readmission Risk Prevention Plan  Transportation Screening Complete Complete  Home Care Screening Complete Complete  Medication Review (RN CM)  Complete   Quintella Baton, RN, BSN  Trauma/Neuro ICU Case Manager [phone]

## 2022-12-24 NOTE — Progress Notes (Signed)
Patient ID: [name], female   DOB: Aug 04, 1950, 72 y.o.   MRN: [id number] [facility] Surgery Progress Note     Subjective: CC-  Complaining of pain mostly in her back and pelvis. She reports some mild central abdominal pain and no appetite. Denies n/v. Passing flatus, BM yesterday. Denies CP or SOB.  Objective: Vital signs in last 24 hours: Temp:  [96.6 F (35.9 C)-98.7 F (37.1 C)] 98.1 F (36.7 C) (10/15 0405) Pulse Rate:  [56-100] 100 (10/15 0405) Resp:  [9-18] 9 (10/15 0149) BP: (131-191)/(61-93) 169/75 (10/15 0405) SpO2:  [92 %-100 %] 100 % (10/15 0405) Weight:  [46.8 kg] 46.8 kg (10/14 2000) Last BM Date :  (PTA)  Intake/Output from previous day: 10/14 0701 - 10/15 0700 In: 231.7 [I.V.:228.2; IV Piggyback:3.4] Out: 302 [Urine:301; Stool:1] Intake/Output this shift: Total I/O In: -  Out: 1000 [Urine:1000]  PE: Gen:  Alert, NAD HEENT: EOM's intact, pupils equal and round Card:  tachy 110-120s Pulm:  CTAB, no W/R/R, rate and effort normal on [place] Abd: Soft, NT/ND, +BS Ext:  no BUE/BLE edema, calves soft and nontender Psych: A&Ox4  Skin: no rashes noted, warm and dry  Lab Results:  Recent Labs    12/22/22 1245 12/23/22 0237  WBC 13.3* 9.3  HGB 13.4 10.8*  HCT 41.6 34.1*  PLT 306 232   BMET Recent Labs    12/22/22 1245 12/23/22 0237  NA 142 147*  K 3.3* 3.4*  CL 103 111  CO2 31 26  GLUCOSE 118* 62*  BUN 22 18  CREATININE 0.75 0.74  CALCIUM 8.9 8.3*   PT/INR Recent Labs    12/22/22 1544  LABPROT 19.0*  INR 1.6*   CMP     Component Value Date/Time   NA 147 (H) 12/23/2022 0237   NA 144 10/14/2016 1030   K 3.4 (L) 12/23/2022 0237   CL 111 12/23/2022 0237   CO2 26 12/23/2022 0237   GLUCOSE 62 (L) 12/23/2022 0237   BUN 18 12/23/2022 0237   BUN 13 10/14/2016 1030   CREATININE 0.74 12/23/2022 0237   CALCIUM 8.3 (L) 12/23/2022 0237   PROT 6.5 12/22/2022 1245   PROT 6.6 10/14/2016 1030   ALBUMIN 3.3 (L) 12/22/2022 1245   ALBUMIN  4.4 10/14/2016 1030   AST 27 12/22/2022 1245   ALT 30 12/22/2022 1245   ALKPHOS 134 (H) 12/22/2022 1245   BILITOT 0.8 12/22/2022 1245   BILITOT 0.2 10/14/2016 1030   GFRNONAA >60 12/23/2022 0237   GFRAA >60 05/06/2019 0936   Lipase     Component Value Date/Time   LIPASE 60 (H) 12/01/2022 1450       Studies/Results: CT HEAD WO CONTRAST ( )  Result Date: 12/22/2022 CLINICAL DATA:  Traumatic brain injury. EXAM: CT HEAD WITHOUT CONTRAST TECHNIQUE: Contiguous axial images were obtained from the base of the skull through the vertex without intravenous contrast. RADIATION DOSE REDUCTION: This exam was performed according to the departmental dose-optimization program which includes automated exposure control, adjustment of the mA and/or kV according to patient size and/or use of iterative reconstruction technique. COMPARISON:  Earlier head CT dated 12/22/2022. FINDINGS: Brain: No significant interval change in the hemorrhagic contusion of the left frontal lobe. No new bleed. The ventricles and sulci are appropriate size for patient's age. Mild periventricular and deep white matter chronic microvascular ischemic changes. No mass effect or midline shift. Vascular: No hyperdense vessel or unexpected calcification. Skull: Normal. Negative for fracture or focal lesion. Sinuses/Orbits: No acute finding.  Other: None IMPRESSION: No significant interval change in the hemorrhagic contusion of the left frontal lobe. No new bleed. Electronically Signed   By: Elgie Collard M.D.   On: 12/22/2022 21:30   CT ABDOMEN PELVIS W CONTRAST  Result Date: 12/22/2022 CLINICAL DATA:  Abdominal trauma, blunt.  Multiple recent falls. EXAM: CT ABDOMEN AND PELVIS WITH CONTRAST TECHNIQUE: Multidetector CT imaging of the abdomen and pelvis was performed using the standard protocol following bolus administration of intravenous contrast. RADIATION DOSE REDUCTION: This exam was performed according to the departmental  dose-optimization program which includes automated exposure control, adjustment of the mA and/or kV according to patient size and/or use of iterative reconstruction technique. CONTRAST:  80mL OMNIPAQUE IOHEXOL 300 MG/ML  SOLN COMPARISON:  CT chest, abdomen and pelvis 12/09/2022 FINDINGS: Lower chest: No pleural effusion. Partially visualized patchy airspace density within the right middle lobe is new from 12/09/2022. Hepatobiliary: No signs of hepatic injury or perihepatic hematoma. Status post cholecystectomy. Similar appearance of diffuse intrahepatic bile duct dilatation and common bile duct dilatation. Unchanged compared with the previous exam. Pancreas: No inflammation or significant main duct dilatation. Scattered parenchymal calcifications and intraductal calcifications noted. Small cystic structure within the tail of pancreas measures 8 mm, image 26/2. Formally this measured the same. Spleen: No splenic injury or perisplenic hematoma. Adrenals/Urinary Tract: No signs of adrenal hemorrhage or renal injury. Numerous bilateral kidney cysts are identified which appear unchanged from previous exam. Previously characterized on MR from 12/02/2022 as Bosniak class 1 and 2 kidney cysts. No follow-up imaging of the cysts recommended. No hydronephrosis identified bilaterally. Urinary bladder appears normal. Stomach/Bowel: Stomach is within normal limits. No pathologic dilatation of the large or small bowel loops. Moderate stool burden within the colon with desiccated stool within the rectum. Sigmoid diverticulosis without signs of acute diverticulitis. Vascular/Lymphatic: Aortic atherosclerosis. Patent upper abdominal vascularity. No signs of abdominopelvic adenopathy. Reproductive: Status post hysterectomy.  No adnexal mass. Other: No free fluid. No discrete fluid collections identified. No pneumoperitoneum. Musculoskeletal: -There is a fracture involving the right inferior pubic rami which is new compared with the  previous exam,a image 73/2. Nondisplaced right superior pubic rami fracture is also suspected, image 64/2. -Compression fractures are again seen involving the T11, T12, L1, and L3 vertebra. Compared with the previous exam there is been interval progression of the T12 compression deformity with total loss of vertebral body height of approximately 40%. -New insufficiency fracture involving the right sacral wing is also noted, image 50/2. IMPRESSION: 1. No signs of solid organ injury within the abdomen or pelvis. No evidence for hemoperitoneum. 2. There are new fractures involving the right sacral wing, right inferior pubic rami and right superior pubic rami. 3. New scratch set interval progression of T12 compression deformity. Unchanged T11, L1 and L3 compression fractures. 4. Partially visualized patchy airspace density within the right middle lobe is new from 12/09/2022. This may represent atelectasis or developing pneumonia. 5. Stable appearance of diffuse intrahepatic and common bile duct dilatation status post cholecystectomy. 6. Scattered calcifications within the pancreatic. stable 8 mm cystic structure within the tail of pancreas. See report from MRI dated 12/02/2022 for follow-up recommendations. 7. Sigmoid diverticulosis without signs of acute diverticulitis. 8.  Aortic Atherosclerosis (ICD10-I70.0). Electronically Signed   By: Signa Kell M.D.   On: 12/22/2022 17:06   DG Hip Unilat W or Wo Pelvis 2-3 Views Right  Result Date: 12/22/2022 CLINICAL DATA:  Fall with right-sided hip pain. EXAM: DG HIP (WITH OR WITHOUT PELVIS) 2-3V RIGHT  COMPARISON:  CT abdomen and pelvis dated 12/09/2022. FINDINGS: There is the appearance of nondisplaced fractures of the right inferior and superior pubic rami. No definite fracture of the right hip. No joint dislocation. Mild-to-moderate osteoarthritis is seen in both hips. There is diffuse osseous demineralization. IMPRESSION: Likely nondisplaced fractures of the right  inferior and superior pubic rami. Recommend CT pelvis without contrast for confirmation of this finding and possible further characterization. Electronically Signed   By: Romona Curls M.D.   On: 12/22/2022 14:23   CT CERVICAL SPINE WO CONTRAST  Result Date: 12/22/2022 CLINICAL DATA:  Fall with neck pain. EXAM: CT CERVICAL SPINE WITHOUT CONTRAST TECHNIQUE: Multidetector CT imaging of the cervical spine was performed without intravenous contrast. Multiplanar CT image reconstructions were also generated. RADIATION DOSE REDUCTION: This exam was performed according to the departmental dose-optimization program which includes automated exposure control, adjustment of the mA and/or kV according to patient size and/or use of iterative reconstruction technique. COMPARISON:  CT cervical spine dated 12/09/2022. FINDINGS: Alignment: No traumatic listhesis. Focal kyphosis at C5-6 is unchanged with partial ankylosis at these levels. Skull base and vertebrae: No acute fracture. No primary bone lesion or focal pathologic process. Soft tissues and spinal canal: No prevertebral fluid or swelling. No visible canal hematoma. Disc levels: Moderate to severe multilevel degenerative disc and joint disease. Upper chest: Negative. Other: None. IMPRESSION: No acute osseous injury of the cervical spine. Electronically Signed   By: Romona Curls M.D.   On: 12/22/2022 13:36   CT Chest Wo Contrast  Result Date: 12/22/2022 CLINICAL DATA:  Fall with pain.  Altered mental status. EXAM: CT CHEST WITHOUT CONTRAST TECHNIQUE: Multidetector CT imaging of the chest was performed following the standard protocol without IV contrast. RADIATION DOSE REDUCTION: This exam was performed according to the departmental dose-optimization program which includes automated exposure control, adjustment of the mA and/or kV according to patient size and/or use of iterative reconstruction technique. COMPARISON:  CT chest dated 12/09/2022. FINDINGS:  Cardiovascular: Vascular calcifications are seen in the aortic arch. Normal heart size. No pericardial effusion. Mediastinum/Nodes: No enlarged mediastinal or axillary lymph nodes. Thyroid gland, trachea, and esophagus demonstrate no significant findings. Lungs/Pleura: Mild patchy ground-glass opacities are seen in the right middle lobe. Biapical pleural-parenchymal scarring is noted. No pleural effusion or pneumothorax. Upper Abdomen: No acute abnormality. Musculoskeletal: There is an acute on chronic compression fracture of T12 with 50-75% height loss centrally and 4 mm retropulsion into the central canal, both slightly progressed since 12/09/2022. There is mild hematoma surrounding the vertebral body. A chronic appearing compression fracture of T11 demonstrates 25% height loss centrally and no significant retropulsion into the central canal, unchanged. IMPRESSION: 1. Acute on chronic compression fracture of T12 with 50-75% height loss centrally and 4 mm retropulsion into the central canal, both slightly progressed since 12/09/2022. 2. Mild patchy ground-glass opacities in the right middle lobe may reflect atelectasis or infection. Aortic Atherosclerosis (ICD10-I70.0). Electronically Signed   By: Romona Curls M.D.   On: 12/22/2022 13:32   CT Lumbar Spine Wo Contrast  Result Date: 12/22/2022 CLINICAL DATA:  Fall with back pain. EXAM: CT LUMBAR SPINE WITHOUT CONTRAST TECHNIQUE: Multidetector CT imaging of the lumbar spine was performed without intravenous contrast administration. Multiplanar CT image reconstructions were also generated. RADIATION DOSE REDUCTION: This exam was performed according to the departmental dose-optimization program which includes automated exposure control, adjustment of the mA and/or kV according to patient size and/or use of iterative reconstruction technique. COMPARISON:  CT lumbar spine  dated 12/09/2022. FINDINGS: Segmentation: 5 lumbar type vertebrae. Alignment: Normal.  Vertebrae: Chronic compression deformities of L1 and L3 with approximately 25% height loss centrally appear unchanged. There is 3 mm retropulsion into the central canal at these levels, unchanged. Ankylosis at L4-L5 is unchanged. Paraspinal and other soft tissues: Vascular calcifications are seen in the abdominal aorta. Disc levels: Multilevel degenerative disc and joint disease. IMPRESSION: 1. No acute fracture or traumatic malalignment of the lumbar spine. 2. Chronic compression deformities of L1 and L3 appear unchanged. Aortic Atherosclerosis (ICD10-I70.0). Electronically Signed   By: Romona Curls M.D.   On: 12/22/2022 13:27   CT Head Wo Contrast  Result Date: 12/22/2022 CLINICAL DATA:  Delirium.  Multiple recent falls. EXAM: CT HEAD WITHOUT CONTRAST TECHNIQUE: Contiguous axial images were obtained from the base of the skull through the vertex without intravenous contrast. RADIATION DOSE REDUCTION: This exam was performed according to the departmental dose-optimization program which includes automated exposure control, adjustment of the mA and/or kV according to patient size and/or use of iterative reconstruction technique. COMPARISON:  CT head without contrast 12/09/2022 FINDINGS: Brain: Focal hemorrhagic contusion is present in the left frontal operculum measures up to 9 mm on coronal images. No other focal hemorrhage is present. Mild atrophy and white matter changes are otherwise stable. Deep brain nuclei are within normal limits. The ventricles are of normal size. No other significant extracranial fluid is present. Calcifications are present along the falx. The brainstem and cerebellum are within normal limits. Midline structures are within normal limits. Vascular: Atherosclerotic calcifications are present within the cavernous internal carotid arteries bilaterally. No hyperdense vessel is present. Skull: Calvarium is intact. No focal lytic or blastic lesions are present. No significant extracranial  soft tissue lesion is present. Sinuses/Orbits: The paranasal sinuses and mastoid air cells are clear. The globes and orbits are within normal limits. IMPRESSION: 1. Focal hemorrhagic contusion in the left frontal operculum measures up to 9 mm on coronal images. 2. No other focal hemorrhage or mass effect is present 3. Stable mild atrophy and white matter disease. This likely reflects the sequela of chronic microvascular ischemia. Critical Value/emergent results were called by telephone at the time of interpretation on 12/22/2022 at 12:43 pm to provider [facility] , who verbally acknowledged these results. Electronically Signed   By: Marin Roberts M.D.   On: 12/22/2022 12:49    Anti-infectives: Anti-infectives (From admission, onward)    None        Assessment/Plan 19F with dementia and frequent falls   Hemorrhagic contusion of cerebrum - NSGY c/s (Dr. Maurice Small), keppra x7d, SLP for cog eval Acute on chronic T12 compression fracture - NSGY c/s, Dr. Maurice Small, nonop, activity as tolerated Anticoagulant use - Kcentra given at [facility] ED. Previous discharge summaries recommended stopping eliquis due to falls. Question per NSGY of cerebral amyloid angiopathy and high risk if AC resumed. Minimum 1w off AC per NSGY.  R pubic rami fx - ortho c/s, Dr. Charlann Boxer, pain control, nonop, WBAT, PT/OT Chronic pain - followed by Dr. Mikael Spray. On subutex, tramadol, benzodiazepines. Home meds reordered 10/15 HTN COPD GERD Hx CVA Cognitive impairment Depression/ anxiety ID - none. U/a pending HTN - home meds reordered 10/15 FEN - regular diet VTE- SCDs, start LMWH ID - no issues Foley - voiding, on flomax Dispo - 4E, therapies - rec SNF. Labs pending. Home meds reordered, monitor for resolution of tachycardia/ hypertension.     I reviewed last 24 h vitals and pain scores, last 48 h  intake and output, last 24 h labs and trends, and last 24 h imaging results.    LOS: 2 days    Franne Forts, Endo [facility] Surgery 12/24/2022, 8:06 AM Please see Amion for pager number during day hours 7:00am-4:30pm

## 2022-12-24 NOTE — Plan of Care (Signed)

## 2022-12-25 LAB — CBC
HCT: 35.1 % — ABNORMAL LOW (ref 36.0–46.0)
Hemoglobin: 11.3 g/dL — ABNORMAL LOW (ref 12.0–15.0)
MCH: 28.6 pg (ref 26.0–34.0)
MCHC: 32.2 g/dL (ref 30.0–36.0)
MCV: 88.9 fL (ref 80.0–100.0)
Platelets: 281 10*3/uL (ref 150–400)
RBC: 3.95 MIL/uL (ref 3.87–5.11)
RDW: 14.3 % (ref 11.5–15.5)
WBC: 7.8 10*3/uL (ref 4.0–10.5)
nRBC: 0 % (ref 0.0–0.2)

## 2022-12-25 LAB — BASIC METABOLIC PANEL
Anion gap: 8 (ref 5–15)
BUN: 18 mg/dL (ref 8–23)
CO2: 27 mmol/L (ref 22–32)
Calcium: 9 mg/dL (ref 8.9–10.3)
Chloride: 106 mmol/L (ref 98–111)
Creatinine, Ser: 0.73 mg/dL (ref 0.44–1.00)
GFR, Estimated: >60 mL/min (ref >60)
Glucose, Bld: 106 mg/dL — ABNORMAL HIGH (ref 70–99)
Potassium: 3.2 mmol/L — ABNORMAL LOW (ref 3.5–5.1)
Sodium: 141 mmol/L (ref 135–145)

## 2022-12-25 MED ORDER — POTASSIUM CHLORIDE CRYS ER 20 MEQ PO TBCR
40.0000 meq | EXTENDED_RELEASE_TABLET | Freq: Two times a day (BID) | ORAL | Status: AC
  Administered 2022-12-25 – 2022-12-26 (×3): 40 meq via ORAL
  Filled 2022-12-25 (×3): qty 2

## 2022-12-25 NOTE — Evaluation (Signed)
Speech Language Pathology Evaluation Patient Details Name: [name] MRN: [id number] DOB: 1951/01/29 Today's Date: 12/25/2022 Time: 0935-1000 SLP Time Calculation (min) (ACUTE ONLY): 25 min  Problem List:  Patient Active Problem List   Diagnosis Date Noted   Focal hemorrhagic contusion of cerebrum (HCC) 12/22/2022   Generalized weakness 12/10/2022   Fall 12/09/2022   PNA (pneumonia) 12/09/2022   Malnutrition of moderate degree 12/04/2022   Hypokalemia 12/03/2022   Elevated LFTs 12/03/2022   Idiopathic chronic pancreatitis (HCC) 12/03/2022   Dilated cbd, acquired 12/03/2022   Opioid dependence (HCC) 12/02/2022   Transaminasemia 12/02/2022   Pressure ulcer of sacral region, stage 4 (HCC) 06/11/2022   RSV (acute bronchiolitis due to respiratory syncytial virus) 02/28/2022   Protein-calorie malnutrition, severe 08/03/2020   COPD (chronic obstructive pulmonary disease) (HCC) 08/02/2020   Acute respiratory failure with hypoxia (HCC) 08/01/2020   Severe recurrent major depression with psychotic features (HCC) 07/03/2020   Hallucinations    History of substance abuse (HCC) 05/10/2020   Psychosis, atypical (HCC) 05/09/2020   Shortness of breath 04/20/2020   Chronic diastolic CHF (congestive heart failure) (HCC) 04/20/2020   Nausea & vomiting 04/20/2020   Prolonged QT interval 04/20/2020   COPD with acute exacerbation (HCC) 06/20/2017   DOE (dyspnea on exertion) 06/19/2017   Cervical dystonia 11/27/2016   C. 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   Bacterial meningitis 05/15/2016   Gastrointestinal hemorrhage with melena 05/14/2016   History of narcotic use 05/14/2016   Acute encephalopathy    Fever    Left hemiparesis (HCC)    Generalized OA    Fibromyalgia    Tobacco abuse    Substance abuse  (HCC)    Benign essential HTN    Tachycardia    Chronic pain syndrome    Acute blood loss anemia    Leukocytosis    Septic thrombophlebitis of sagittal sinus    Acute respiratory failure (HCC)    Streptococcus pneumoniae meningitis    Streptococcal bacteremia    Meningitis    History of stroke 04/12/2016   Stroke (cerebrum) (HCC) 04/12/2016   Neck pain 04/04/2016   Chronic abdominal pain 02/19/2016   Essential hypertension 02/19/2016   Bacterial conjunctivitis 02/19/2016   Health care maintenance 02/19/2016   Anxiety 05/08/2006   Cigarette smoker 05/08/2006   ASTHMA, UNSPECIFIED 05/08/2006   GERD (gastroesophageal reflux disease) 05/08/2006   CONSTIPATION 05/08/2006   CONVULSIONS, SEIZURES, NOS 05/08/2006   Past Medical History:  Past Medical History:  Diagnosis Date   Acute blood loss anemia    Arrhythmia    Arthritis    C. difficile colitis    Cancer (HCC)    COPD (chronic obstructive pulmonary disease) (HCC)    Diverticulosis    Fibromyalgia    GERD (gastroesophageal reflux disease)    GI hemorrhage    H/O: substance abuse (HCC) 2005   narcotic usage due to chronic back pain   Heart murmur    Hypertension    Pancreatic cyst    Pneumococcal meningitis    PONV (postoperative nausea and vomiting)    Renal cyst    Stroke (cerebrum) (HCC)    Past Surgical History:  Past Surgical History:  Procedure Laterality Date   ABDOMINAL HYSTERECTOMY     ABDOMINAL SURGERY     APPENDECTOMY     BIOPSY  04/22/2020   Procedure: BIOPSY;  Surgeon: Rachael Fee, MD;  Location: [facility];  Service: Gastroenterology;;   BREAST SURGERY     CHOLECYSTECTOMY N/A 09/30/2012   Procedure: LAPAROSCOPIC CHOLECYSTECTOMY WITH INTRAOPERATIVE CHOLANGIOGRAM;  Surgeon: Wilmon Arms. Corliss Skains, MD;  Location: [facility];  Service: General;  Laterality: N/A;   ESOPHAGOGASTRODUODENOSCOPY (EGD) WITH PROPOFOL N/A 04/22/2020   Procedure: ESOPHAGOGASTRODUODENOSCOPY (EGD) WITH PROPOFOL;  Surgeon: Rachael Fee, MD;  Location: [facility];  Service: Gastroenterology;  Laterality: N/A;   FRACTURE SURGERY     right upper arm   OVARIAN CYST SURGERY     HPI:  Pt is 72 yo female who presents on 12/22/22 after being found down at ALF and with AMS. Pt with head contusion and nondisplaced fxs of the R inferior and superior pubic rami, and R sacral wing. Progression of T12 compression fx and chronic T11, L1, L3 compression fxs. PMH: COPD, CHF, GERD, HTN, CVA, fibromyalgia, sacral decubitus, meningitis, chronic pancreatitis. 2 recent admissions from falls, COPD exacerbation, and respiratory failure   Assessment / Plan / Recommendation Clinical Impression  Pt seen for cognitive-linguistic evaluation. Assessment completed via informal means and SLUMS. Pt scored 10/30 on SLUMS with impairments noted in attention, memory, problem solving, and executive functioning. Reduced insight into CLOF and current deficits. During today's assessment, Rancho Level VI - Confused; Appropriate. Recommend continued SLP services acute/post-acute for cognitive-linguistic functioning.    SLP Assessment  SLP Recommendation/Assessment: Patient needs continued Speech Lanaguage Pathology Services SLP Visit Diagnosis: Frontal lobe and executive function deficit;Attention and concentration deficit;Cognitive communication deficit (R41.841) Attention and concentration deficit following:  (traumatic frontal contusion) Frontal lobe and executive function deficit following:  (traumatic frontal contusion)    Recommendations for follow up therapy are one component of a multi-disciplinary discharge planning process, led by the attending physician.  Recommendations may be updated based on patient status, additional functional criteria and insurance authorization.    Follow Up Recommendations  Skilled nursing-short term rehab (<3 hours/day) (in alignment with PT/OT)    Assistance Recommended at Discharge  Frequent or constant  Supervision/Assistance  Functional Status Assessment Patient has had a recent decline in their functional status and demonstrates the ability to make significant improvements in function in a reasonable and predictable amount of time.  Frequency and Duration min 2x/week  2 weeks      SLP Evaluation Cognition  Overall Cognitive Status: Impaired/Different from baseline Arousal/Alertness: Awake/alert Orientation Level: Oriented to person;Oriented to place;Disoriented to time;Oriented to situation Day of Week: Incorrect (not oriented to date or DOW) Attention: Sustained;Selective Sustained Attention: Impaired Sustained Attention Impairment: Verbal basic Selective Attention: Impaired Memory: Impaired Memory Impairment: Storage deficit;Retrieval deficit;Decreased recall of new information;Decreased short term memory Decreased Short Term Memory: Verbal basic Awareness: Impaired Awareness Impairment: Emergent impairment Problem Solving: Impaired Problem Solving Impairment: Verbal basic;Functional basic Executive Function: Self Monitoring;Self Correcting;Sequencing;Organizing Sequencing: Impaired Organizing: Impaired Self Monitoring: Impaired Self Correcting: Impaired Self Correcting Impairment: Verbal basic Safety/Judgment: Impaired [place] Scales of Cognitive Functioning: Confused, Appropriate: Moderate Assistance       Comprehension  Auditory Comprehension Overall Auditory Comprehension: Appears within functional limits for tasks assessed    Expression Expression Primary Mode of Expression: Verbal Verbal Expression Overall Verbal Expression: Appears within functional limits for tasks assessed (with exception of divergent naming which is mildly reduced; pt named 11 animals in 60s)   Oral / Motor  Oral Motor/Sensory Function Overall Oral Motor/Sensory Function: Within functional limits Motor Speech Overall Motor Speech: Appears within functional limits for  tasks  assessed Respiration: Within functional limits Phonation: Normal Resonance: Within functional limits Articulation: Within functional limitis Intelligibility: Intelligible Motor Planning: Within functional limits           Clyde Canterbury, M.S., CCC-SLP Speech-Language Pathologist Secure Chat Preferred  O: [phone]   Woodroe Chen 12/25/2022, 10:25 AM

## 2022-12-25 NOTE — Progress Notes (Signed)
Mobility Specialist Progress Note:   12/25/22 1520  Mobility  Activity Ambulated with assistance in hallway  Level of Assistance Contact guard assist, steadying assist  Assistive Device Front wheel walker  Distance Ambulated (ft) 80 ft  Range of Motion/Exercises Active;All extremities  Activity Response Tolerated well  Mobility Referral Yes  $Mobility charge 1 Mobility  Mobility Specialist Start Time (ACUTE ONLY) 1520  Mobility Specialist Stop Time (ACUTE ONLY) 1530  Mobility Specialist Time Calculation (min) (ACUTE ONLY) 10 min   Pt received in bed, agreeable to mobility session. Ambulated in hallway with RW, slow but steady gait. Tolerated session well, asx throughout. Returned pt to room, all needs met.   Feliciana Rossetti Mobility Specialist Please contact via Special educational needs teacher or  Rehab office at [phone]

## 2022-12-25 NOTE — Progress Notes (Signed)
Physical Therapy Treatment Patient Details Name: [name] MRN: [id number] DOB: May 19, 1950 Today's Date: 12/25/2022   History of Present Illness [name] is a 72 y.o. female with medical history significant for stroke, chronic pancreatitis, COPD, stage IV decubitus ulcer, diastolic CHF.  Patient presented to the ED from assisted living facility with reports of 2 falls today, and generalized weakness.  Patient tells me she felt weak all over and this resulted in the fall.  No difficulty breathing.  No cough.  No chest pain.  No dizziness.  No vomiting.  No loose stools.  Reports urinary symptoms of frequency and dysuria.  She denies problems with swallowing  She reports chronic unchanged low back pain, chronic unchanged abdominal pain related to her chronic pancreatitis.     Recently hospitalized 9/22 to 9/26 for COPD exacerbation, hypoxic and hypercarbic respiratory failure, also had elevated LFTs, she was to follow-up outpatient for EUS.  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    PT Comments  Pt received sitting in the recliner and agreeable to session. Pt demonstrating improved alertness and participation this session. Pt demonstrates improved power up to stand and is able to tolerate increased gait distance with CGA this session. Pt motivated to progress mobility, however remains limited by RLE pain. Pt continues to benefit from PT services to progress toward functional mobility goals.     If plan is discharge home, recommend the following: Help with stairs or ramp for entrance;Assistance with cooking/housework;A lot of help with walking and/or transfers;A lot of help with bathing/dressing/bathroom;Direct supervision/assist for medications management;Direct supervision/assist for financial management;Assist for transportation;Supervision due to cognitive status   Can travel by private vehicle     No  Equipment  Recommendations  None recommended by PT    Recommendations for Other Services       Precautions / Restrictions Precautions Precautions: Fall Restrictions Weight Bearing Restrictions: No RLE Weight Bearing: Weight bearing as tolerated     Mobility  Bed Mobility               General bed mobility comments: Pt beginning and ending session in recliner    Transfers Overall transfer level: Needs assistance Equipment used: None Transfers: Sit to/from Stand Sit to Stand: Contact guard assist           General transfer comment: Pt able to stand with BUE support on chair arms with CGA for safety. Slight instability in standing, but no LOB    Ambulation/Gait Ambulation/Gait assistance: Contact guard assist Gait Distance (Feet): 40 Feet Assistive device: Rolling walker (2 wheels) Gait Pattern/deviations: Decreased stride length, Decreased step length - left, Step-to pattern, Trunk flexed, Decreased stance time - right Gait velocity: decreased     General Gait Details: slow step-to pattern with decreased RLE WB tolerance due to pain, but little instability with RW support. cues for upright posture and upward gaze       Balance Overall balance assessment: Needs assistance Sitting-balance support: Feet supported, No upper extremity supported Sitting balance-Leahy Scale: Fair Sitting balance - Comments: sitting in recliner   Standing balance support: During functional activity, Reliant on assistive device for balance, Bilateral upper extremity supported Standing balance-Leahy Scale: Poor Standing balance comment: with RW support                            Cognition Arousal: Alert Behavior During Therapy: [facility] for  tasks assessed/performed Overall Cognitive Status: Impaired/Different from baseline                                          Exercises      General Comments        Pertinent Vitals/Pain Pain Assessment Pain Assessment:  Faces Faces Pain Scale: Hurts little more Pain Location: RLE Pain Descriptors / Indicators: Aching, Discomfort Pain Intervention(s): Limited activity within patient's tolerance, Monitored during session, Repositioned     PT Goals (current goals can now be found in the care plan section) Acute Rehab PT Goals Patient Stated Goal: figure out why she keeps falling PT Goal Formulation: With patient Time For Goal Achievement: 01/06/23 Progress towards PT goals: Progressing toward goals    Frequency    Min 1X/week       AM-PAC PT "6 Clicks" Mobility   Outcome Measure  Help needed turning from your back to your side while in a flat bed without using bedrails?: A Lot Help needed moving from lying on your back to sitting on the side of a flat bed without using bedrails?: A Lot Help needed moving to and from a bed to a chair (including a wheelchair)?: A Little Help needed standing up from a chair using your arms (e.g., wheelchair or bedside chair)?: A Little Help needed to walk in hospital room?: A Little Help needed climbing 3-5 steps with a railing? : A Lot 6 Click Score: 15    End of Session Equipment Utilized During Treatment: Gait belt Activity Tolerance: Patient tolerated treatment well Patient left: with call bell/phone within reach;in chair;with chair alarm set Nurse Communication: Mobility status PT Visit Diagnosis: Unsteadiness on feet (R26.81);Other abnormalities of gait and mobility (R26.89);Muscle weakness (generalized) (M62.81);Repeated falls (R29.6);Difficulty in walking, not elsewhere classified (R26.2);Pain     Time: 6962-9528 PT Time Calculation (min) (ACUTE ONLY): 21 min  Charges:    $Gait Training: 8-22 mins PT General Charges $$ ACUTE PT VISIT: 1 Visit                     Johny Shock, PTA Acute Rehabilitation Services Secure Chat Preferred  [phone]    Johny Shock 12/25/2022, 11:29 AM

## 2022-12-25 NOTE — Plan of Care (Signed)
  Problem: Coping: Goal: Level of anxiety will decrease Outcome: Progressing   Problem: Elimination: Goal: Will not experience complications related to urinary retention Outcome: Progressing   Problem: Education: Goal: Knowledge of General Education information will improve Description: Including pain rating scale, medication(s)/side effects and non-pharmacologic comfort measures Outcome: Not Progressing   Problem: Health Behavior/Discharge Planning: Goal: Ability to manage health-related needs will improve Outcome: Not Progressing   Problem: Activity: Goal: Risk for activity intolerance will decrease Outcome: Not Progressing   Problem: Nutrition: Goal: Adequate nutrition will be maintained Outcome: Not Progressing

## 2022-12-25 NOTE — Progress Notes (Addendum)
Patient ID: [name], female   DOB: 1950/03/20, 72 y.o.   MRN: [id number] [facility] Surgery Progress Note     Subjective: Pt reports pain is about what it has been. Wants to go home.   Objective: Vital signs in last 24 hours: Temp:  [98 F (36.7 C)-98.6 F (37 C)] 98 F (36.7 C) (10/16 0740) Pulse Rate:  [55-79] 79 (10/16 0813) Resp:  [9-19] 16 (10/16 0813) BP: (106-177)/(49-88) 146/67 (10/16 0740) SpO2:  [100 %] 100 % (10/16 0813) FiO2 (%):  [28 %] 28 % (10/16 0813) Weight:  [51.5 kg] 51.5 kg (10/16 0310) Last BM Date :  (PTA)  Intake/Output from previous day: 10/15 0701 - 10/16 0700 In: 240 [P.O.:240] Out: 1000 [Urine:1000] Intake/Output this shift: No intake/output data recorded.  PE: Gen:  Alert, NAD HEENT: EOM's intact, pupils equal and round Card:  RRR Pulm:  CTAB, no W/R/R, rate and effort normal on [employer] Abd: Soft, NT/ND, +BS Ext:  no BUE/BLE edema, calves soft and nontender Skin: no rashes noted, warm and dry  Lab Results:  Recent Labs    12/24/22 0828 12/25/22 0406  WBC 14.5* 7.8  HGB 13.3 11.3*  HCT 41.3 35.1*  PLT 360 281   BMET Recent Labs    12/24/22 0828 12/25/22 0406  NA 144 141  K 3.6 3.2*  CL 108 106  CO2 23 27  GLUCOSE 115* 106*  BUN 13 18  CREATININE 0.75 0.73  CALCIUM 9.5 9.0   PT/INR Recent Labs    12/22/22 1544  LABPROT 19.0*  INR 1.6*   CMP     Component Value Date/Time   NA 141 12/25/2022 0406   NA 144 10/14/2016 1030   K 3.2 (L) 12/25/2022 0406   CL 106 12/25/2022 0406   CO2 27 12/25/2022 0406   GLUCOSE 106 (H) 12/25/2022 0406   BUN 18 12/25/2022 0406   BUN 13 10/14/2016 1030   CREATININE 0.73 12/25/2022 0406   CALCIUM 9.0 12/25/2022 0406   PROT 6.5 12/22/2022 1245   PROT 6.6 10/14/2016 1030   ALBUMIN 3.3 (L) 12/22/2022 1245   ALBUMIN 4.4 10/14/2016 1030   AST 27 12/22/2022 1245   ALT 30 12/22/2022 1245   ALKPHOS 134 (H) 12/22/2022 1245   BILITOT 0.8 12/22/2022 1245   BILITOT 0.2 10/14/2016  1030   GFRNONAA >60 12/25/2022 0406   GFRAA >60 05/06/2019 0936   Lipase     Component Value Date/Time   LIPASE 60 (H) 12/01/2022 1450       Studies/Results: No results found.  Anti-infectives: Anti-infectives (From admission, onward)    None        Assessment/Plan 25F with dementia and frequent falls   Hemorrhagic contusion of cerebrum - NSGY c/s (Dr. Maurice Small), keppra x7d, SLP for cog eval Acute on chronic T12 compression fracture - NSGY c/s, Dr. Maurice Small, nonop, activity as tolerated Anticoagulant use - Kcentra given at [facility] ED. Previous discharge summaries recommended stopping eliquis due to falls. Question per NSGY of cerebral amyloid angiopathy and high risk if AC resumed. Minimum 1w off AC per NSGY.  R pubic rami fx - ortho c/s, Dr. Charlann Boxer, pain control, nonop, WBAT, PT/OT Chronic pain - followed by Dr. Mikael Spray. On subutex, tramadol, benzodiazepines. Home meds reordered 10/15 HTN COPD GERD Hx CVA Cognitive impairment Depression/ anxiety ID - none. No leukocytosis this AM HTN - home meds reordered 10/15 Mild hypokalemia - K 3.2, replace PO FEN - regular diet VTE- SCDs, start LMWH ID -  no issues Foley - voiding, on flomax Dispo - 4E, therapies - rec SNF. Labs pending. Tachycardia improved and HTN better. Medically stable for discharge to SNF    I reviewed last 24 h vitals and pain scores, last 48 h intake and output, last 24 h labs and trends, and last 24 h imaging results.    LOS: 3 days    Juliet Rude, [facility] Surgery 12/25/2022, 8:58 AM Please see Amion for pager number during day hours 7:00am-4:30pm

## 2022-12-25 NOTE — Care Management Important Message (Signed)
Important Message  Patient Details  Name: [name] MRN: [id number] Date of Birth: 07/25/1950   Important Message Given:  Yes - Medicare IM     Sherilyn Banker 12/25/2022, 4:34 PM

## 2022-12-26 LAB — CBC
HCT: 34.1 % — ABNORMAL LOW (ref 36.0–46.0)
Hemoglobin: 11 g/dL — ABNORMAL LOW (ref 12.0–15.0)
MCH: 29.4 pg (ref 26.0–34.0)
MCHC: 32.3 g/dL (ref 30.0–36.0)
MCV: 91.2 fL (ref 80.0–100.0)
Platelets: 291 10*3/uL (ref 150–400)
RBC: 3.74 MIL/uL — ABNORMAL LOW (ref 3.87–5.11)
RDW: 14.8 % (ref 11.5–15.5)
WBC: 5.4 10*3/uL (ref 4.0–10.5)
nRBC: 0 % (ref 0.0–0.2)

## 2022-12-26 LAB — BASIC METABOLIC PANEL
Anion gap: 3 — ABNORMAL LOW (ref 5–15)
BUN: 27 mg/dL — ABNORMAL HIGH (ref 8–23)
CO2: 26 mmol/L (ref 22–32)
Calcium: 8.5 mg/dL — ABNORMAL LOW (ref 8.9–10.3)
Chloride: 112 mmol/L — ABNORMAL HIGH (ref 98–111)
Creatinine, Ser: 0.83 mg/dL (ref 0.44–1.00)
GFR, Estimated: >60 mL/min (ref >60)
Glucose, Bld: 144 mg/dL — ABNORMAL HIGH (ref 70–99)
Potassium: 4.6 mmol/L (ref 3.5–5.1)
Sodium: 141 mmol/L (ref 135–145)

## 2022-12-26 NOTE — Progress Notes (Signed)
Patient ID: [name], female   DOB: [birth year], 72 y.o.   MRN: [id number] [facility] Surgery Progress Note     Subjective: Eating breakfast, appears comfortable. No acute change   Objective: Vital signs in last 24 hours: Temp:  [97.8 F (36.6 C)-98.5 F (36.9 C)] 98.5 F (36.9 C) (10/17 0900) Pulse Rate:  [70-103] 103 (10/17 0900) Resp:  [10-16] 14 (10/17 0900) BP: (114-159)/(60-67) 134/63 (10/17 0900) SpO2:  [97 %-100 %] 100 % (10/17 0900) Last BM Date : 12/25/22  Intake/Output from previous day: 10/16 0701 - 10/17 0700 In: 420 [P.O.:420] Out: 600 [Urine:600] Intake/Output this shift: No intake/output data recorded.  PE: Gen:  Alert, NAD HEENT: EOM's intact, pupils equal and round Card:  RRR Pulm:  CTAB, no W/R/R, rate and effort normal on [place] Abd: Soft, NT/ND, +BS Ext:  no BUE/BLE edema, calves soft and nontender Skin: no rashes noted, warm and dry  Lab Results:  Recent Labs    12/24/22 0828 12/25/22 0406  WBC 14.5* 7.8  HGB 13.3 11.3*  HCT 41.3 35.1*  PLT 360 281   BMET Recent Labs    12/24/22 0828 12/25/22 0406  NA 144 141  K 3.6 3.2*  CL 108 106  CO2 23 27  GLUCOSE 115* 106*  BUN 13 18  CREATININE 0.75 0.73  CALCIUM 9.5 9.0   PT/INR No results for input(s): "LABPROT", "INR" in the last 72 hours.  CMP     Component Value Date/Time   NA 141 12/25/2022 0406   NA 144 10/14/2016 1030   K 3.2 (L) 12/25/2022 0406   CL 106 12/25/2022 0406   CO2 27 12/25/2022 0406   GLUCOSE 106 (H) 12/25/2022 0406   BUN 18 12/25/2022 0406   BUN 13 10/14/2016 1030   CREATININE 0.73 12/25/2022 0406   CALCIUM 9.0 12/25/2022 0406   PROT 6.5 12/22/2022 1245   PROT 6.6 10/14/2016 1030   ALBUMIN 3.3 (L) 12/22/2022 1245   ALBUMIN 4.4 10/14/2016 1030   AST 27 12/22/2022 1245   ALT 30 12/22/2022 1245   ALKPHOS 134 (H) 12/22/2022 1245   BILITOT 0.8 12/22/2022 1245   BILITOT 0.2 10/14/2016 1030   GFRNONAA >60 12/25/2022 0406   GFRAA >60 05/06/2019 0936    Lipase     Component Value Date/Time   LIPASE 60 (H) 12/01/2022 1450       Studies/Results: No results found.  Anti-infectives: Anti-infectives (From admission, onward)    None        Assessment/Plan 38F with dementia and frequent falls   Hemorrhagic contusion of cerebrum - NSGY c/s (Dr. Maurice Small), keppra x7d, SLP for cog eval Acute on chronic T12 compression fracture - NSGY c/s, Dr. Maurice Small, nonop, activity as tolerated Anticoagulant use - Kcentra given at [facility] ED. Previous discharge summaries recommended stopping eliquis due to falls. Question per NSGY of cerebral amyloid angiopathy and high risk if AC resumed. Minimum 1w off AC per NSGY.  R pubic rami fx - ortho c/s, Dr. Charlann Boxer, pain control, nonop, WBAT, PT/OT Chronic pain - followed by Dr. Mikael Spray. On subutex, tramadol, benzodiazepines. Home meds reordered 10/15 HTN COPD GERD Hx CVA Cognitive impairment Depression/ anxiety ID - none. No leukocytosis HTN - home meds reordered 10/15 Mild hypokalemia - K 3.2 yesterday, replaced PO FEN - regular diet VTE- SCDs, start LMWH ID - no issues Foley - voiding, on flomax Dispo - 4E, therapies - rec SNF. Labs pending. Tachycardia improved and HTN better. Medically stable for discharge to  SNF    I reviewed last 24 h vitals and pain scores, last 48 h intake and output, last 24 h labs and trends, and last 24 h imaging results.    LOS: 4 days    Juliet Rude, [facility] Surgery 12/26/2022, 10:00 AM Please see Amion for pager number during day hours 7:00am-4:30pm

## 2022-12-26 NOTE — Progress Notes (Signed)
   Progress Note   Date: 12/25/2022  Patient Name: [name]        MRN#: [id number]  Eliquis contributed to Hematoma

## 2022-12-26 NOTE — TOC Progression Note (Signed)
Transition of Care [facility]) - Progression Note    Patient Details  Name: MICHON KACZMAREK MRN: [id number] Date of Birth: Nov 22, 1950  Transition of Care [facility]) CM/SW Contact  Glennon Mac, RN Phone Number: 12/26/2022, 4:20 PM  Clinical Narrative:    Bed offers given to patient's son, Ethelene Browns: he chooses [facility], after being given ratings from Medicare. Gov.  Notified Olegario Messier in admissions of bed acceptance; we will start insurance authorization.  Plan for dc pending insurance auth, hopefully by tomorrow.    Expected Discharge Plan: Skilled Nursing Facility Barriers to Discharge: Continued Medical Work up  Expected Discharge Plan and Services     Post Acute Care Choice: Skilled Nursing Facility                                         Social Determinants of Health (SDOH) Interventions SDOH Screenings   Food Insecurity: No Food Insecurity (12/09/2022)  Housing: Low Risk  (12/09/2022)  Transportation Needs: No Transportation Needs (12/09/2022)  Utilities: Not At Risk (12/09/2022)  Social Connections: Unknown (07/21/2021)   Received from [facility]  Stress: Stress Concern Present (05/10/2020)   Received from [facility], [facility]  Tobacco Use: High Risk (12/22/2022)    Readmission Risk Interventions    12/03/2022   10:10 AM 03/01/2022   11:53 AM  Readmission Risk Prevention Plan  Transportation Screening Complete Complete  Home Care Screening Complete Complete  Medication Review (RN CM)  Complete   Quintella Baton, RN, BSN  Trauma/Neuro ICU Case Manager [phone]

## 2022-12-26 NOTE — Plan of Care (Signed)

## 2022-12-26 NOTE — Progress Notes (Signed)
Mobility Specialist Progress Note:   12/26/22 1150  Mobility  Activity Ambulated with assistance in room;Transferred to/from [facility]  Level of Assistance Contact guard assist, steadying assist  Assistive Device Front wheel walker  Distance Ambulated (ft) 40 ft  RLE Weight Bearing WBAT  Activity Response Tolerated well  Mobility Referral Yes  $Mobility charge 1 Mobility  Mobility Specialist Start Time (ACUTE ONLY) 1150  Mobility Specialist Stop Time (ACUTE ONLY) 1208  Mobility Specialist Time Calculation (min) (ACUTE ONLY) 18 min   Pt received in bed, agreeable to mobility session. Tolerated well, asx throughout. Pt requested assistance to bedside commode. Peri care performed. Returned pt to bed, all needs met, call bell in reach, bed alarm on, and fall mat placed.   Feliciana Rossetti Mobility Specialist Please contact via Special educational needs teacher or  Rehab office at [phone]

## 2022-12-27 LAB — BASIC METABOLIC PANEL
Anion gap: 11 (ref 5–15)
BUN: 53 mg/dL — ABNORMAL HIGH (ref 8–23)
CO2: 25 mmol/L (ref 22–32)
Calcium: 9.2 mg/dL (ref 8.9–10.3)
Chloride: 105 mmol/L (ref 98–111)
Creatinine, Ser: 0.83 mg/dL (ref 0.44–1.00)
GFR, Estimated: >60 mL/min (ref >60)
Glucose, Bld: 163 mg/dL — ABNORMAL HIGH (ref 70–99)
Potassium: 4.8 mmol/L (ref 3.5–5.1)
Sodium: 141 mmol/L (ref 135–145)

## 2022-12-27 LAB — URINALYSIS, ROUTINE W REFLEX MICROSCOPIC
Bilirubin Urine: NEGATIVE
Glucose, UA: NEGATIVE mg/dL
Ketones, ur: 5 mg/dL — AB
Nitrite: NEGATIVE
Protein, ur: 30 mg/dL — AB
Specific Gravity, Urine: 1.016 (ref 1.005–1.030)
pH: 6 (ref 5.0–8.0)

## 2022-12-27 LAB — CBC
HCT: 35.9 % — ABNORMAL LOW (ref 36.0–46.0)
Hemoglobin: 11.7 g/dL — ABNORMAL LOW (ref 12.0–15.0)
MCH: 29.1 pg (ref 26.0–34.0)
MCHC: 32.6 g/dL (ref 30.0–36.0)
MCV: 89.3 fL (ref 80.0–100.0)
Platelets: 411 10*3/uL — ABNORMAL HIGH (ref 150–400)
RBC: 4.02 MIL/uL (ref 3.87–5.11)
RDW: 14.6 % (ref 11.5–15.5)
WBC: 12.2 10*3/uL — ABNORMAL HIGH (ref 4.0–10.5)
nRBC: 0 % (ref 0.0–0.2)

## 2022-12-27 MED ORDER — CARVEDILOL 12.5 MG PO TABS
12.5000 mg | ORAL_TABLET | Freq: Two times a day (BID) | ORAL | Status: DC
  Administered 2022-12-27 (×2): 12.5 mg via ORAL
  Filled 2022-12-27 (×2): qty 1

## 2022-12-27 MED ORDER — ACETAMINOPHEN 500 MG PO TABS: 1000.0000 mg | ORAL_TABLET | Freq: Four times a day (QID) | ORAL | Status: AC

## 2022-12-27 MED ORDER — METHOCARBAMOL 500 MG PO TABS: 500.0000 mg | ORAL_TABLET | Freq: Four times a day (QID) | ORAL | Status: DC | PRN

## 2022-12-27 MED ORDER — TAMSULOSIN HCL 0.4 MG PO CAPS: 0.4000 mg | ORAL_CAPSULE | Freq: Every day | ORAL | Status: DC

## 2022-12-27 MED ORDER — LABETALOL HCL 5 MG/ML IV SOLN
10.0000 mg | Freq: Once | INTRAVENOUS | Status: AC
  Administered 2022-12-27: 10 mg via INTRAVENOUS
  Filled 2022-12-27: qty 4

## 2022-12-27 MED ORDER — CARVEDILOL 6.25 MG PO TABS: 12.5000 mg | ORAL_TABLET | Freq: Two times a day (BID) | ORAL | Status: DC

## 2022-12-27 MED ORDER — LACTATED RINGERS IV BOLUS
500.0000 mL | Freq: Once | INTRAVENOUS | Status: AC
  Administered 2022-12-27: 500 mL via INTRAVENOUS

## 2022-12-27 MED ORDER — POLYETHYLENE GLYCOL 3350 17 G PO PACK: 17.0000 g | PACK | Freq: Every day | ORAL | Status: DC

## 2022-12-27 MED ORDER — DOCUSATE SODIUM 100 MG PO CAPS: 100.0000 mg | ORAL_CAPSULE | Freq: Two times a day (BID) | ORAL | Status: DC

## 2022-12-27 MED ORDER — METOPROLOL TARTRATE 5 MG/5ML IV SOLN
5.0000 mg | Freq: Once | INTRAVENOUS | Status: AC
  Administered 2022-12-27: 5 mg via INTRAVENOUS

## 2022-12-27 NOTE — Discharge Summary (Signed)
Physician Discharge Summary  Patient ID: [name] MRN: [id number] DOB/AGE: 1951/02/29 72 y.o.  Admit date: 12/22/2022 Discharge date: 01/05/2023  Admission Diagnoses Fall, initial encounter [W19.XXXA] Closed bilateral fracture of pubic rami, initial encounter (HCC) [S32.591A, S32.592A] Focal hemorrhagic contusion of cerebrum [facility]) [S06.33AA]  Discharge Diagnoses Fall, initial encounter [W19.XXXA] Closed bilateral fracture of pubic rami, initial encounter (HCC) [S32.591A, S32.592A] Focal hemorrhagic contusion of cerebrum (HCC) [S06.33AA] Acute on chronic T12 compression fracture  Chronic pain  Consultants Orthopedic surgery - Dr. Charlann Boxer Neurosurgery - Dr. Maurice Small  Procedures none  HPI: 72 yo female with frequent falls in assisted living. She was found down by family. She complains of right leg and arm pain. She does not remember why she fell. She was brought to [facility] ED and then transferred to [facility] for trauma admission.  Hospital Course:   Patient was admitted to the trauma service for further evaluation and treatment as below:   Hemorrhagic contusion of cerebrum - neurosurgery was consulted and she was placed on 7 days keppra for seizure prophylaxis. Speech saw her for cognitive evaluation Acute on chronic T12 compression fracture - NSGY consulted, Dr. Maurice Small who reccommended non operative management and activity as tolerated Anticoagulant use - Kcentra given at [facility] ED. Previous discharge summaries recommended stopping eliquis due to falls. Continued recommendation from trauma standpoint for permanent discontinuation of anticoagulation. 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  R pubic rami fx - orthopedics consulted, Dr. Charlann Boxer who recommended non operative management. Pain control during admission. She can weight bear as tolerating. She worked with therapies  during admission. Chronic pain - followed by Dr. Mikael Spray. On subutex, tramadol, benzodiazepines. Home meds reordered 10/15 HTN -POA, stable during admission COPD -POA, stable during admission GERD -POA, stable during admission Hx CVA -POA, stable during admission Cognitive impairment -POA, stable during admission Depression/ anxiety -POA, stable during admission HTN - home meds reordered 10/15, increased coreg to 12.5 BID for HTN and tachycardia 10/18  On date of discharge patient had appropriately progressed and met criteria for safe discharge to SNF.  I discussed discharge instructions with patient as well as return precautions and all questions and concerns were addressed.   I or a member of my team have reviewed this patient in the Controlled Substance Database.  Patient agrees to follow up as below.  I was not directly involved in this patient's care therefore the information in this discharge summary was taken from the chart.  Allergies as of 12/27/2022       Reactions   Nsaids Other (See Comments), Nausea And Vomiting   Stomach ulcers   Tylenol [acetaminophen] Other (See Comments)   Inflammed Liver   Codeine Nausea And Vomiting   Tolmetin Other (See Comments)   Stomach ulcers   Tramadol Other (See Comments)   GI UPSET Per ALF        Medication List     STOP taking these medications    apixaban 5 MG Tabs tablet Commonly known as: ELIQUIS   ciprofloxacin 0.3 % ophthalmic solution Commonly known as: CILOXAN       TAKE these medications    acetaminophen 500 MG tablet Commonly known as: TYLENOL Take 2 tablets (1,000 mg total) by mouth every 6 (six) hours.   albuterol 108 (90 Base) MCG/ACT inhaler Commonly known as: VENTOLIN HFA Inhale 2 puffs into the lungs every 4 (four) hours as needed for wheezing or  shortness of breath.   ARIPiprazole 5 MG tablet Commonly known as: ABILIFY Take 5 mg by mouth daily.   ascorbic acid 500 MG tablet Commonly  known as: VITAMIN C Take 500 mg by mouth daily.   buprenorphine 8 MG Subl SL tablet Commonly known as: SUBUTEX Place 4 mg under the tongue every 6 (six) hours.   carvedilol 6.25 MG tablet Commonly known as: COREG Take 2 tablets (12.5 mg total) by mouth 2 (two) times daily. What changed: how much to take   docusate sodium 100 MG capsule Commonly known as: COLACE Take 1 capsule (100 mg total) by mouth 2 (two) times daily.   donepezil 10 MG tablet Commonly known as: ARICEPT Take 10 mg by mouth daily.   doxepin 50 MG capsule Commonly known as: SINEQUAN Take 50 mg by mouth daily.   Ensure Original Liqd Take 1 Container by mouth 3 (three) times daily.   famotidine 20 MG tablet Commonly known as: PEPCID Take 20 mg by mouth daily.   Fish Oil 1000 MG Caps Take 1 capsule by mouth 3 (three) times daily.   fluticasone 50 MCG/ACT nasal spray Commonly known as: FLONASE Place 1 spray into both nostrils 2 (two) times daily as needed for allergies.   Intrarosa 6.5 MG Inst Generic drug: Prasterone Place 1 suppository vaginally at bedtime.   ipratropium 0.03 % nasal spray Commonly known as: ATROVENT Place 2 sprays into both nostrils See admin instructions. Use 2 sprays in each nostril once every three hours as needed for rhinitis.   levocetirizine 5 MG tablet Commonly known as: XYZAL Take 5 mg by mouth daily.   LORazepam 0.5 MG tablet Commonly known as: ATIVAN Take 0.5 mg by mouth 2 (two) times daily. Do not administer AFTER 3   losartan 100 MG tablet Commonly known as: COZAAR Take 0.5 tablets (50 mg total) by mouth daily. What changed: how much to take   MAGNESIUM CITRATE PO Take 1 capsule by mouth 3 (three) times daily.   magnesium oxide 400 (240 Mg) MG tablet Commonly known as: MAG-OX Take 1 tablet by mouth daily.   methocarbamol 500 MG tablet Commonly known as: ROBAXIN Take 1 tablet (500 mg total) by mouth every 6 (six) hours as needed for muscle spasms.    mirtazapine 7.5 MG tablet Commonly known as: REMERON Take 7.5 mg by mouth at bedtime.   OLANZapine 10 MG tablet Commonly known as: ZyPREXA Take 1 tablet (10 mg total) by mouth at bedtime.   polyethylene glycol 17 g packet Commonly known as: MIRALAX / GLYCOLAX Take 17 g by mouth daily. Start taking on: December 28, 2022   tamsulosin 0.4 MG Caps capsule Commonly known as: FLOMAX Take 1 capsule (0.4 mg total) by mouth daily. Start taking on: December 28, 2022   temazepam 22.5 MG capsule Commonly known as: RESTORIL Take 22.5 mg by mouth at bedtime.   Thera-Tabs M Tabs Take 1 tablet by mouth daily.   traMADol 50 MG tablet Commonly known as: ULTRAM Take 75 mg by mouth 4 (four) times daily.   traZODone 50 MG tablet Commonly known as: DESYREL Take 25 mg by mouth at bedtime.   Trelegy Ellipta 100-62.5-25 MCG/ACT Aepb Generic drug: Fluticasone-Umeclidin-Vilant Inhale 1 puff into the lungs daily.   Vitamin D3 50 MCG (2000 UT) capsule Take 2,000 Units by mouth 4 (four) times daily.          Contact information for follow-up providers     Durene Romans, MD Follow up in  3 week(s).   Specialty: Orthopedic Surgery Why: clinical and radiographic follow up Contact information: [address] [place] [address] [phone]         Frederich Chick., MD Follow up.   Specialty: Family Medicine Contact information: [address] [place] [address] [phone]         Jadene Pierini, MD Follow up.   Specialty: Neurosurgery Contact information: [address] [place] [address] [place] [address] [phone]              Contact information for after-discharge care     Destination     [facility] Preferred SNF .   Service: Skilled Nursing Contact information: [address] [place] [address] [phone]                     Signed: Diamantina Monks, MD General and Trauma  Surgery [facility]

## 2022-12-27 NOTE — Progress Notes (Signed)
Labatalol 10mg  IV x 1 given for elevated HR and BP with improvement noted.    12/27/22 0700  Vitals  BP (!) 148/91  MAP (mmHg) 107  BP Location Right Arm  BP Method Automatic  Patient Position (if appropriate) Lying  Pulse Rate 94  Pulse Rate Source Monitor  ECG Heart Rate 95  Resp 20  Level of Consciousness  Level of Consciousness Alert  MEWS COLOR  MEWS Score Color Green  Oxygen Therapy  SpO2 98 %  O2 Device Room Air  MEWS Score  MEWS Temp 0  MEWS Systolic 0  MEWS Pulse 0  MEWS RR 0  MEWS LOC 0  MEWS Score 0   Hilton Sinclair BSN RN [facility] 12/27/2022, 7:14 AM

## 2022-12-27 NOTE — Progress Notes (Signed)
Pt seen for evaluation. Full note to follow. Recommend rehab at SNF. Requires physical assistance with bed mobility and transfers. Easily fatigues, and pain is a limiting factor.    12/27/2022  RP, OTR/L  Acute Rehabilitation Services  Office:  [phone]

## 2022-12-27 NOTE — Progress Notes (Signed)
Physical Therapy Treatment Patient Details Name: [name] MRN: [id number] DOB: 1951/02/27 Today's Date: 12/27/2022   History of Present Illness [name] is a 72 y.o. female with medical history significant for stroke, chronic pancreatitis, COPD, stage IV decubitus ulcer, diastolic CHF.  Patient presented to the ED from assisted living facility with reports of 2 falls today, and generalized weakness.  Patient tells me she felt weak all over and this resulted in the fall.  No difficulty breathing.  No cough.  No chest pain.  No dizziness.  No vomiting.  No loose stools.  Reports urinary symptoms of frequency and dysuria.  She denies problems with swallowing  She reports chronic unchanged low back pain, chronic unchanged abdominal pain related to her chronic pancreatitis.     Recently hospitalized 9/22 to 9/26 for COPD exacerbation, hypoxic and hypercarbic respiratory failure, also had elevated LFTs, she was to follow-up outpatient for EUS.  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    PT Comments  Pt received in supine and agreeable to session. Pt demonstrating improved mobility, however continues to require increased time to complete tasks. Pt able to tolerate short gait distance around the bed, however HR noted to increase to 150's with mobility, so pt requires a seated rest break. Pt able to ambulate back to chair with HR increasing again and pt reporting abdominal pain once sitting, RN notified. Pt demonstrating very short steps, however is able to slightly improve with cues. Pt continues to benefit from PT services to progress toward functional mobility goals.     If plan is discharge home, recommend the following: Help with stairs or ramp for entrance;Assistance with cooking/housework;A lot of help with walking and/or transfers;A lot of help with bathing/dressing/bathroom;Direct supervision/assist for medications  management;Direct supervision/assist for financial management;Assist for transportation;Supervision due to cognitive status   Can travel by private vehicle     No  Equipment Recommendations  None recommended by PT    Recommendations for Other Services       Precautions / Restrictions Precautions Precautions: Fall Restrictions Weight Bearing Restrictions: No RLE Weight Bearing: Weight bearing as tolerated     Mobility  Bed Mobility Overal bed mobility: Needs Assistance Bed Mobility: Supine to Sit     Supine to sit: Contact guard, HOB elevated     General bed mobility comments: increased time    Transfers Overall transfer level: Needs assistance Equipment used: Rolling walker (2 wheels) Transfers: Sit to/from Stand Sit to Stand: Contact guard assist           General transfer comment: From EOB x2 with increased time for power up and CGA for safety. Cues for hand placement    Ambulation/Gait Ambulation/Gait assistance: Contact guard assist Gait Distance (Feet): 10 Feet (x2) Assistive device: Rolling walker (2 wheels) Gait Pattern/deviations: Decreased stride length, Decreased step length - left, Step-to pattern, Trunk flexed, Decreased stance time - right Gait velocity: decreased     General Gait Details: slow step-to pattern with decreased RLE WB tolerance due to pain. cues for upright posture and increased step length      Balance Overall balance assessment: Needs assistance Sitting-balance support: Feet supported, No upper extremity supported Sitting balance-Leahy Scale: Fair Sitting balance - Comments: sitting EOB   Standing balance support: During functional activity, Reliant on assistive device for balance, Bilateral upper extremity supported Standing balance-Leahy Scale: Poor Standing balance comment: with RW support  Cognition Arousal: Alert Behavior During Therapy: WFL for tasks assessed/performed Overall  Cognitive Status: Impaired/Different from baseline                                 General Comments: increased time for processing and requires increased cues at times        Exercises      General Comments General comments (skin integrity, edema, etc.): HR up to 158bpm with mobility and recovering to 120's with rest      Pertinent Vitals/Pain Pain Assessment Pain Assessment: Faces Faces Pain Scale: Hurts little more Pain Location: RLE Pain Descriptors / Indicators: Aching, Discomfort, Guarding Pain Intervention(s): Monitored during session, Repositioned     PT Goals (current goals can now be found in the care plan section) Acute Rehab PT Goals Patient Stated Goal: figure out why she keeps falling PT Goal Formulation: With patient Time For Goal Achievement: 01/06/23 Progress towards PT goals: Progressing toward goals    Frequency    Min 1X/week       AM-PAC PT "6 Clicks" Mobility   Outcome Measure  Help needed turning from your back to your side while in a flat bed without using bedrails?: A Little Help needed moving from lying on your back to sitting on the side of a flat bed without using bedrails?: A Little Help needed moving to and from a bed to a chair (including a wheelchair)?: A Little Help needed standing up from a chair using your arms (e.g., wheelchair or bedside chair)?: A Little Help needed to walk in hospital room?: A Little Help needed climbing 3-5 steps with a railing? : A Lot 6 Click Score: 17    End of Session Equipment Utilized During Treatment: Gait belt Activity Tolerance: Treatment limited secondary to medical complications (Comment);Patient limited by fatigue (limited by increased HR) Patient left: with call bell/phone within reach;in chair;with chair alarm set Nurse Communication: Mobility status PT Visit Diagnosis: Unsteadiness on feet (R26.81);Other abnormalities of gait and mobility (R26.89);Muscle weakness (generalized)  (M62.81);Repeated falls (R29.6);Difficulty in walking, not elsewhere classified (R26.2);Pain     Time: 2725-3664 PT Time Calculation (min) (ACUTE ONLY): 24 min  Charges:    $Gait Training: 8-22 mins $Therapeutic Activity: 8-22 mins PT General Charges $$ ACUTE PT VISIT: 1 Visit                     [name] Shock, PTA Acute Rehabilitation Services Secure Chat Preferred  [phone]    [name] Shock 12/27/2022, 9:47 AM

## 2022-12-27 NOTE — Evaluation (Signed)
Occupational Therapy Evaluation Patient Details Name: [name] MRN: [id number] DOB: [birth year] Today's Date: 12/27/2022   History of Present Illness [name] is a 72 y.o. female with medical history significant for stroke, chronic pancreatitis, COPD, stage IV decubitus ulcer, diastolic CHF.  Patient presented to the ED from assisted living facility with reports of 2 falls today, and generalized weakness.  Patient tells me she felt weak all over and this resulted in the fall.  No difficulty breathing.  No cough.  No chest pain.  No dizziness.  No vomiting.  No loose stools.  Reports urinary symptoms of frequency and dysuria.  She denies problems with swallowing  She reports chronic unchanged low back pain, chronic unchanged abdominal pain related to her chronic pancreatitis.     Recently hospitalized 9/22 to 9/26 for COPD exacerbation, hypoxic and hypercarbic respiratory failure, also had elevated LFTs, she was to follow-up outpatient for EUS.  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   Clinical Impression   Patient admitted for the diagnosis above.  PTA she lives at a local ALF, but had difficulty describing her prior level of function, at times saying she could not remember.  Biggest deficit appear to be pelvic pain, especially with weight bearing.  Currently she is needing up to Mod A for lower body ADL from a sit to stand level, and Min A for very short bouts of mobility/transfers.  OT will follow in the acute setting to address deficits, Patient will benefit from continued inpatient follow up therapy, <3 hours/day.  Unclear the level of support she could receive at her ALF,           If plan is discharge home, recommend the following: A lot of help with walking and/or transfers;A lot of help with bathing/dressing/bathroom;Direct supervision/assist for financial management;Direct supervision/assist for  medications management    Functional Status Assessment  Patient has had a recent decline in their functional status and demonstrates the ability to make significant improvements in function in a reasonable and predictable amount of time.  Equipment Recommendations  None recommended by OT    Recommendations for Other Services       Precautions / Restrictions Precautions Precautions: Fall Restrictions Weight Bearing Restrictions: No RLE Weight Bearing: Weight bearing as tolerated      Mobility Bed Mobility Overal bed mobility: Needs Assistance Bed Mobility: Supine to Sit, Sit to Supine     Supine to sit: Min assist, HOB elevated Sit to supine: Min assist, Mod assist        Transfers Overall transfer level: Needs assistance Equipment used: Rolling walker (2 wheels) Transfers: Sit to/from Stand Sit to Stand: Min assist, From elevated surface                  Balance Overall balance assessment: Needs assistance Sitting-balance support: Feet supported, No upper extremity supported Sitting balance-Leahy Scale: Fair     Standing balance support: Reliant on assistive device for balance Standing balance-Leahy Scale: Poor                             ADL either performed or assessed with clinical judgement   ADL Overall ADL's : Needs assistance/impaired Eating/Feeding: Set up;Sitting   Grooming: Wash/dry hands;Wash/dry face;Contact guard assist;Sitting   Upper Body Bathing: Minimal assistance;Sitting   Lower Body Bathing: Moderate assistance;Sit to/from stand   Upper Body Dressing :  Minimal assistance;Sitting   Lower Body Dressing: Moderate assistance;Maximal assistance;Sit to/from stand   Toilet Transfer: Minimal assistance;Stand-pivot                   Vision   Vision Assessment?: No apparent visual deficits     Perception Perception: Not tested       Praxis Praxis: Not tested       Pertinent Vitals/Pain Pain  Assessment Pain Assessment: Faces Faces Pain Scale: Hurts little more Pain Location: RLE Pain Descriptors / Indicators: Aching, Discomfort, Guarding, Sore Pain Intervention(s): Monitored during session     Extremity/Trunk Assessment Upper Extremity Assessment Upper Extremity Assessment: Generalized weakness;RUE deficits/detail;LUE deficits/detail;Right hand dominant RUE Deficits / Details: Grossly 3+/5 for MMT, full AROM RUE Sensation: WNL RUE Coordination: WNL LUE Deficits / Details: Grossly 3+/5 for MMT, full AROM LUE Sensation: WNL LUE Coordination: WNL   Lower Extremity Assessment Lower Extremity Assessment: Defer to PT evaluation   Cervical / Trunk Assessment Cervical / Trunk Assessment: Kyphotic   Communication Communication Communication: No apparent difficulties   Cognition Arousal: Alert Behavior During Therapy: WFL for tasks assessed/performed Overall Cognitive Status: Impaired/Different from baseline                 [place] Levels of Cognitive Functioning [place] Scales of Cognitive Functioning: Confused, Appropriate: Moderate Assistance   Current Attention Level: Sustained Memory: Decreased short-term memory Following Commands: Follows one step commands with increased time   Awareness: Intellectual Problem Solving: Slow processing, Requires verbal cues, Decreased initiation   [place] [address] Scales of Cognitive Functioning: Confused, Appropriate: Moderate Assistance [VI]   General Comments   HR pretty steady at 121    Exercises     Shoulder Instructions      Home Living Family/patient expects to be discharged to:: Assisted living                             Home Equipment: Rolling Walker (2 wheels);Cane - single point;Wheelchair - manual          Prior Functioning/Environment Prior Level of Function : Needs assist       Physical Assist : Mobility (physical);ADLs (physical) Mobility (physical): Bed  mobility;Transfers;Gait;Stairs   Mobility Comments: pt reports she walked without AD until she started falling and has recently been using rollator but continues to fall ADLs Comments: Patient stating she showers 3 to 4x/wk, walks to shower room, and needed no assist from staff.        OT Problem List: Decreased strength;Decreased range of motion;Decreased activity tolerance;Impaired balance (sitting and/or standing);Pain;Decreased safety awareness;Decreased cognition      OT Treatment/Interventions: Self-care/ADL training;Therapeutic activities;Cognitive remediation/compensation;Balance training;DME and/or AE instruction;Patient/family education    OT Goals(Current goals can be found in the care plan section) Acute Rehab OT Goals Patient Stated Goal: none stated OT Goal Formulation: Patient unable to participate in goal setting Time For Goal Achievement: 01/10/23 Potential to Achieve Goals: Fair ADL Goals Pt Will Perform Grooming: with contact guard assist;standing Pt Will Perform Upper Body Dressing: with set-up;sitting Pt Will Perform Lower Body Dressing: with min assist;sit to/from stand Pt Will Transfer to Toilet: with min assist;ambulating;regular height toilet  OT Frequency: Min 1X/week    Co-evaluation              AM-PAC OT "6 Clicks" Daily Activity     Outcome Measure Help from another person eating meals?: A Little Help from another person taking care of  personal grooming?: A Little Help from another person toileting, which includes using toliet, bedpan, or urinal?: A Lot Help from another person bathing (including washing, rinsing, drying)?: A Lot Help from another person to put on and taking off regular upper body clothing?: A Little Help from another person to put on and taking off regular lower body clothing?: A Lot 6 Click Score: 15   End of Session Equipment Utilized During Treatment: Gait belt;Rolling walker (2 wheels) Nurse Communication: Mobility  status  Activity Tolerance: Patient limited by pain Patient left: in bed;with call bell/phone within reach;with bed alarm set  OT Visit Diagnosis: Unsteadiness on feet (R26.81);Muscle weakness (generalized) (M62.81);History of falling (Z91.81);Pain Pain - Right/Left: Right Pain - part of body: Leg;Hip                Time: 1610-9604 OT Time Calculation (min): 22 min Charges:  OT General Charges $OT Visit: 1 Visit OT Evaluation $OT Eval Moderate Complexity: 1 Mod  12/27/2022  RP, OTR/L  Acute Rehabilitation Services  Office:  [phone]   Suzanna Obey 12/27/2022, 5:42 PM

## 2022-12-27 NOTE — Progress Notes (Signed)
HR noted to be creeping up to 125s but remained sinus triggering yellow MEWS.  BP 177/97.  PRN Metoprolol given per orders.  Discussed with CN Tim.  Will continue yellow MEWS.   12/27/22 0322 12/27/22 0400  Vitals  Temp 98.8 F (37.1 C)  --   Temp Source Oral  --   BP (!) 158/83 (!) 177/97  MAP (mmHg) 102 119  BP Location Right Arm Right Arm  BP Method Automatic Automatic  Patient Position (if appropriate) Lying Lying  Pulse Rate (!) 118 (!) 121  Pulse Rate Source Monitor Monitor  ECG Heart Rate (!) 121 (!) 123  Resp 16 16  Level of Consciousness  Level of Consciousness  --  Alert  MEWS COLOR  MEWS Score Color Yellow Yellow  Oxygen Therapy  SpO2 94 % 94 %  O2 Device Room Air Room Air    Hilton Sinclair BSN RN [facility] 12/27/2022, 4:21 AM

## 2022-12-27 NOTE — Plan of Care (Signed)
[facility] Tonsillectomy/Adenoidectomy, Postoperative PEDS care plan entered in error.

## 2022-12-27 NOTE — Progress Notes (Signed)
Patient ID: [name], female   DOB: [birth year], 72 y.o.   MRN: [id number] [facility] Surgery Progress Note     Subjective: Sitting in chair and no complaints.   Objective: Vital signs in last 24 hours: Temp:  [98.2 F (36.8 C)-99.1 F (37.3 C)] 99.1 F (37.3 C) (10/18 0805) Pulse Rate:  [69-121] 112 (10/18 0805) Resp:  [13-20] 16 (10/18 0805) BP: (101-183)/(52-103) 160/64 (10/18 0805) SpO2:  [94 %-100 %] 98 % (10/18 0834) Last BM Date : 12/26/22  Intake/Output from previous day: 10/17 0701 - 10/18 0700 In: 720 [P.O.:720] Out: 850 [Urine:850] Intake/Output this shift: No intake/output data recorded.  PE: Gen:  Alert, NAD HEENT: EOM's intact, pupils equal and round Card:  sinus tachycardia, no M/G/R Pulm:  CTAB, no W/R/R, rate and effort normal on [place] Abd: Soft, NT/ND, +BS Ext:  no BUE/BLE edema, calves soft and nontender Skin: no rashes noted, warm and dry  Lab Results:  Recent Labs    12/25/22 0406 12/26/22 1025  WBC 7.8 5.4  HGB 11.3* 11.0*  HCT 35.1* 34.1*  PLT 281 291   BMET Recent Labs    12/25/22 0406 12/26/22 1025  NA 141 141  K 3.2* 4.6  CL 106 112*  CO2 27 26  GLUCOSE 106* 144*  BUN 18 27*  CREATININE 0.73 0.83  CALCIUM 9.0 8.5*   PT/INR No results for input(s): "LABPROT", "INR" in the last 72 hours.  CMP     Component Value Date/Time   NA 141 12/26/2022 1025   NA 144 10/14/2016 1030   K 4.6 12/26/2022 1025   CL 112 (H) 12/26/2022 1025   CO2 26 12/26/2022 1025   GLUCOSE 144 (H) 12/26/2022 1025   BUN 27 (H) 12/26/2022 1025   BUN 13 10/14/2016 1030   CREATININE 0.83 12/26/2022 1025   CALCIUM 8.5 (L) 12/26/2022 1025   PROT 6.5 12/22/2022 1245   PROT 6.6 10/14/2016 1030   ALBUMIN 3.3 (L) 12/22/2022 1245   ALBUMIN 4.4 10/14/2016 1030   AST 27 12/22/2022 1245   ALT 30 12/22/2022 1245   ALKPHOS 134 (H) 12/22/2022 1245   BILITOT 0.8 12/22/2022 1245   BILITOT 0.2 10/14/2016 1030   GFRNONAA >60 12/26/2022 1025   GFRAA >60  05/06/2019 0936   Lipase     Component Value Date/Time   LIPASE 60 (H) 12/01/2022 1450       Studies/Results: No results found.  Anti-infectives: Anti-infectives (From admission, onward)    None        Assessment/Plan 34F with dementia and frequent falls   Hemorrhagic contusion of cerebrum - NSGY c/s (Dr. Maurice Small), keppra x7d, SLP for cog eval Acute on chronic T12 compression fracture - NSGY c/s, Dr. Maurice Small, nonop, activity as tolerated Anticoagulant use - Kcentra given at [facility] ED. Previous discharge summaries recommended stopping eliquis due to falls. Question per NSGY of cerebral amyloid angiopathy and high risk if AC resumed. Minimum 1w off AC per NSGY.  R pubic rami fx - ortho c/s, Dr. Charlann Boxer, pain control, nonop, WBAT, PT/OT Chronic pain - followed by Dr. Mikael Spray. On subutex, tramadol, benzodiazepines. Home meds reordered 10/15 HTN COPD GERD Hx CVA Cognitive impairment Depression/ anxiety ID - none. No leukocytosis HTN - home meds reordered 10/15, increased coreg to 12.5 BID for HTN and tachycardia 10/18  FEN - regular diet VTE- SCDs, start LMWH ID - no issues Foley - voiding, on flomax Dispo - 4E, Increased Coreg - if BP and HR improve then  stable for DC later today     I reviewed last 24 h vitals and pain scores, last 48 h intake and output, last 24 h labs and trends, and last 24 h imaging results.    LOS: 5 days    Juliet Rude, [facility] Surgery 12/27/2022, 10:33 AM Please see Amion for pager number during day hours 7:00am-4:30pm

## 2022-12-28 MED ORDER — CARVEDILOL 6.25 MG PO TABS: 12.5000 mg | ORAL_TABLET | Freq: Two times a day (BID) | ORAL | Status: AC

## 2022-12-28 MED ORDER — CARVEDILOL 6.25 MG PO TABS
6.2500 mg | ORAL_TABLET | Freq: Two times a day (BID) | ORAL | Status: DC
  Administered 2022-12-28 – 2023-01-05 (×14): 6.25 mg via ORAL
  Filled 2022-12-28 (×14): qty 1

## 2022-12-28 MED ORDER — NITROFURANTOIN MONOHYD MACRO 100 MG PO CAPS: 100.0000 mg | ORAL_CAPSULE | Freq: Two times a day (BID) | ORAL | Status: AC

## 2022-12-28 MED ORDER — NITROFURANTOIN MONOHYD MACRO 100 MG PO CAPS
100.0000 mg | ORAL_CAPSULE | Freq: Two times a day (BID) | ORAL | Status: AC
  Administered 2022-12-28 – 2023-01-01 (×9): 100 mg via ORAL
  Filled 2022-12-28 (×10): qty 1

## 2022-12-28 NOTE — Plan of Care (Signed)
  Problem: Education: Goal: Knowledge of General Education information will improve Description Including pain rating scale, medication(s)/side effects and non-pharmacologic comfort measures Outcome: Progressing   

## 2022-12-28 NOTE — Progress Notes (Addendum)
Mobility Specialist Progress Note:   12/28/22 1325  Mobility  Activity Ambulated with assistance in room  Level of Assistance Contact guard assist, steadying assist  Assistive Device Front wheel walker  Distance Ambulated (ft) 30 ft  RLE Weight Bearing WBAT  Activity Response Tolerated well  Mobility Referral Yes  $Mobility charge 1 Mobility  Mobility Specialist Start Time (ACUTE ONLY) 1250  Mobility Specialist Stop Time (ACUTE ONLY) 1315  Mobility Specialist Time Calculation (min) (ACUTE ONLY) 25 min   Pre Mobility: 106 HR  During Mobility: 128 HR  Post Mobility: 110 HR   Pt received in bed, agreeable to mobility. SB to stand. CG during ambulation. Seated rest break required d/t general fatigue. Pt c/o slight lightheadedness and RLE pain, otherwise asymptomatic throughout. Pt left in chair with call bell in reach and all needs met. Chair alarm on.  Leory Plowman  Mobility Specialist Please contact via SecureChat Rehab office at [phone]

## 2022-12-28 NOTE — Progress Notes (Signed)
Patient ID: [name], female   DOB: January 22, 1951, 72 y.o.   MRN: [id number] [facility] Surgery Progress Note     Subjective: Patient with concerns about where she is going from hospital. I let her know that her son selected a rehab facility for her and we are awaiting insurance auth.   Objective: Vital signs in last 24 hours: Temp:  [97.9 F (36.6 C)-99.2 F (37.3 C)] 97.9 F (36.6 C) (10/19 0300) Pulse Rate:  [80-117] 94 (10/19 0600) Resp:  [18-20] 18 (10/19 0300) BP: (85-137)/(50-77) 96/50 (10/19 0600) SpO2:  [94 %-99 %] 96 % (10/19 0600) Last BM Date : 12/26/22  Intake/Output from previous day: 10/18 0701 - 10/19 0700 In: 362.5 [IV Piggyback:362.5] Out: 300 [Urine:300] Intake/Output this shift: No intake/output data recorded.  PE: Gen:  Alert, NAD HEENT: EOM's intact, pupils equal and round Card:  sinus tachycardia, no M/G/R Pulm:  CTAB, no W/R/R, rate and effort normal on [place] Abd: Soft, NT/ND, +BS Ext:  no BUE/BLE edema, calves soft and nontender Skin: no rashes noted, warm and dry  Lab Results:  Recent Labs    12/26/22 1025 12/27/22 1136  WBC 5.4 12.2*  HGB 11.0* 11.7*  HCT 34.1* 35.9*  PLT 291 411*   BMET Recent Labs    12/26/22 1025 12/27/22 1136  NA 141 141  K 4.6 4.8  CL 112* 105  CO2 26 25  GLUCOSE 144* 163*  BUN 27* 53*  CREATININE 0.83 0.83  CALCIUM 8.5* 9.2   PT/INR No results for input(s): "LABPROT", "INR" in the last 72 hours.  CMP     Component Value Date/Time   NA 141 12/27/2022 1136   NA 144 10/14/2016 1030   K 4.8 12/27/2022 1136   CL 105 12/27/2022 1136   CO2 25 12/27/2022 1136   GLUCOSE 163 (H) 12/27/2022 1136   BUN 53 (H) 12/27/2022 1136   BUN 13 10/14/2016 1030   CREATININE 0.83 12/27/2022 1136   CALCIUM 9.2 12/27/2022 1136   PROT 6.5 12/22/2022 1245   PROT 6.6 10/14/2016 1030   ALBUMIN 3.3 (L) 12/22/2022 1245   ALBUMIN 4.4 10/14/2016 1030   AST 27 12/22/2022 1245   ALT 30 12/22/2022 1245   ALKPHOS 134  (H) 12/22/2022 1245   BILITOT 0.8 12/22/2022 1245   BILITOT 0.2 10/14/2016 1030   GFRNONAA >60 12/27/2022 1136   GFRAA >60 05/06/2019 0936   Lipase     Component Value Date/Time   LIPASE 60 (H) 12/01/2022 1450       Studies/Results: No results found.  Anti-infectives: Anti-infectives (From admission, onward)    Start     Dose/Rate Route Frequency Ordered Stop   12/28/22 1000  nitrofurantoin (macrocrystal-monohydrate) (MACROBID) capsule 100 mg        100 mg Oral Every 12 hours 12/28/22 0736 01/02/23 0959   12/28/22 0000  nitrofurantoin, macrocrystal-monohydrate, (MACROBID) 100 MG capsule        100 mg Oral Every 12 hours 12/28/22 0737          Assessment/Plan 10F with dementia and frequent falls   Hemorrhagic contusion of cerebrum - NSGY c/s (Dr. Maurice Small), keppra x7d, SLP for cog eval Acute on chronic T12 compression fracture - NSGY c/s, Dr. Maurice Small, nonop, activity as tolerated Anticoagulant use - Kcentra given at [facility] ED. Previous discharge summaries recommended stopping eliquis due to falls. Question per NSGY of cerebral amyloid angiopathy and high risk if AC resumed. Minimum 1w off AC per NSGY.  R pubic rami  fx - ortho c/s, Dr. Charlann Boxer, pain control, nonop, WBAT, PT/OT Chronic pain - followed by Dr. Mikael Spray. On subutex, tramadol, benzodiazepines. Home meds reordered 10/15 HTN COPD GERD Hx CVA Cognitive impairment Depression/ anxiety ID - WBC 12, afeb, UA with many bacteria and moderate leukocytes, start macrobid x 5 days  HTN - home meds reordered 10/15, BP soft overnight, decreased coreg back to usual dose   FEN - regular diet VTE- SCDs, start LMWH ID - no issues Foley - voiding, on flomax Dispo - 4E, PO abx for UTI. Stable for DC to SNF.     I reviewed last 24 h vitals and pain scores, last 48 h intake and output, last 24 h labs and trends, and last 24 h imaging results.    LOS: 6 days    Juliet Rude, [facility]  Surgery 12/28/2022, 10:10 AM Please see Amion for pager number during day hours 7:00am-4:30pm

## 2022-12-28 NOTE — Progress Notes (Signed)
Pt soft blood pressures throughout dayshift. Pt lethargic and called MD concerning robaxin and subutex. Will hold for now and continue to monitor patient and pain needs. Pt resting with call bell within reach.  Will continue to monitor.

## 2022-12-29 LAB — CBC
HCT: 18.9 % — ABNORMAL LOW (ref 36.0–46.0)
Hemoglobin: 6.1 g/dL — CL (ref 12.0–15.0)
MCH: 28.6 pg (ref 26.0–34.0)
MCHC: 32.3 g/dL (ref 30.0–36.0)
MCV: 88.7 fL (ref 80.0–100.0)
Platelets: 309 10*3/uL (ref 150–400)
RBC: 2.13 MIL/uL — ABNORMAL LOW (ref 3.87–5.11)
RDW: 15.8 % — ABNORMAL HIGH (ref 11.5–15.5)
WBC: 8.6 10*3/uL (ref 4.0–10.5)
nRBC: 0 % (ref 0.0–0.2)

## 2022-12-29 LAB — ABO/RH: ABO/RH(D): O POS

## 2022-12-29 LAB — BASIC METABOLIC PANEL
Anion gap: 8 (ref 5–15)
BUN: 48 mg/dL — ABNORMAL HIGH (ref 8–23)
CO2: 24 mmol/L (ref 22–32)
Calcium: 8.3 mg/dL — ABNORMAL LOW (ref 8.9–10.3)
Chloride: 112 mmol/L — ABNORMAL HIGH (ref 98–111)
Creatinine, Ser: 0.88 mg/dL (ref 0.44–1.00)
GFR, Estimated: >60 mL/min (ref >60)
Glucose, Bld: 85 mg/dL (ref 70–99)
Potassium: 4 mmol/L (ref 3.5–5.1)
Sodium: 144 mmol/L (ref 135–145)

## 2022-12-29 LAB — OCCULT BLOOD X 1 CARD TO LAB, STOOL: Fecal Occult Bld: POSITIVE — AB

## 2022-12-29 LAB — HEMOGLOBIN AND HEMATOCRIT, BLOOD
HCT: 20.3 % — ABNORMAL LOW (ref 36.0–46.0)
Hemoglobin: 6.4 g/dL — CL (ref 12.0–15.0)

## 2022-12-29 LAB — PREPARE RBC (CROSSMATCH)

## 2022-12-29 MED ORDER — LACTATED RINGERS IV SOLN: INTRAVENOUS | Status: DC

## 2022-12-29 MED ORDER — SODIUM CHLORIDE 0.9% IV SOLUTION: Freq: Once | INTRAVENOUS | Status: AC

## 2022-12-29 MED ORDER — DEXTROSE 5 % AND 0.45 % NACL IV BOLUS
500.0000 mL | Freq: Once | INTRAVENOUS | Status: AC
  Administered 2022-12-29: 500 mL via INTRAVENOUS

## 2022-12-29 NOTE — Progress Notes (Signed)
Plan of Care Note  Patient has been intermittently hypotensive throughout the day.  She is on multiple anti-hypertensive medications and has been intermittent hypotensive over the past few days as well.  On her last CBC on 10/18 her Hb was 11.  Repeat labs this afternoon showed a Hb of 6.1.  Repeat H&H showed Hb of 6.4.    I arrived at bedside to evaluate.  She is resting in bed and appears somewhat lethargic.  She is easy to arouse. She reports fatigue but denies other complaints.  BP 95/50s (MAP 60s), HR 80s.  Her abdomen is soft, non-distended, and non-tender.  Per nursing, the patient did have a dark BM earlier today.  A/P: It is unclear at this time whether the drop in Hb is the result of injuries suffered during her trauma versus GIB.     - Will order 2 units pRBCs.  Repeat H&H to follow - Will consider CTA versus GI consult pending response to transfusion and result of heme occult stool  - NPO with MIVF  Melody Haver, MD   General Surgeon [facility], [place]

## 2022-12-29 NOTE — Progress Notes (Signed)
Patient ID: [name], female   DOB: 1950/07/22, 72 y.o.   MRN: [id number] [facility] Surgery Progress Note     Subjective: Soft BP overnight but stable. Eating breakfast this AM  Objective: Vital signs in last 24 hours: Temp:  [97.9 F (36.6 C)-98.8 F (37.1 C)] 98 F (36.7 C) (10/20 0801) Pulse Rate:  [68-98] 68 (10/20 0931) Resp:  [16-18] 18 (10/20 0801) BP: (81-109)/(35-50) 94/43 (10/20 0931) SpO2:  [96 %-100 %] 97 % (10/20 0931) Last BM Date : 12/26/22  Intake/Output from previous day: No intake/output data recorded. Intake/Output this shift: Total I/O In: -  Out: 700 [Urine:700]  PE: Gen:  Alert, NAD HEENT: EOM's intact, pupils equal and round Card:  sinus tachycardia, no M/G/R Pulm:  CTAB, no W/R/R, rate and effort normal on [place] Abd: Soft, NT/ND, +BS Ext:  no BUE/BLE edema, calves soft and nontender Skin: no rashes noted, warm and dry  Lab Results:  Recent Labs    12/27/22 1136  WBC 12.2*  HGB 11.7*  HCT 35.9*  PLT 411*   BMET Recent Labs    12/27/22 1136  NA 141  K 4.8  CL 105  CO2 25  GLUCOSE 163*  BUN 53*  CREATININE 0.83  CALCIUM 9.2   PT/INR No results for input(s): "LABPROT", "INR" in the last 72 hours.  CMP     Component Value Date/Time   NA 141 12/27/2022 1136   NA 144 10/14/2016 1030   K 4.8 12/27/2022 1136   CL 105 12/27/2022 1136   CO2 25 12/27/2022 1136   GLUCOSE 163 (H) 12/27/2022 1136   BUN 53 (H) 12/27/2022 1136   BUN 13 10/14/2016 1030   CREATININE 0.83 12/27/2022 1136   CALCIUM 9.2 12/27/2022 1136   PROT 6.5 12/22/2022 1245   PROT 6.6 10/14/2016 1030   ALBUMIN 3.3 (L) 12/22/2022 1245   ALBUMIN 4.4 10/14/2016 1030   AST 27 12/22/2022 1245   ALT 30 12/22/2022 1245   ALKPHOS 134 (H) 12/22/2022 1245   BILITOT 0.8 12/22/2022 1245   BILITOT 0.2 10/14/2016 1030   GFRNONAA >60 12/27/2022 1136   GFRAA >60 05/06/2019 0936   Lipase     Component Value Date/Time   LIPASE 60 (H) 12/01/2022 1450        Studies/Results: No results found.  Anti-infectives: Anti-infectives (From admission, onward)    Start     Dose/Rate Route Frequency Ordered Stop   12/28/22 1000  nitrofurantoin (macrocrystal-monohydrate) (MACROBID) capsule 100 mg        100 mg Oral Every 12 hours 12/28/22 0736 01/02/23 0959   12/28/22 0000  nitrofurantoin, macrocrystal-monohydrate, (MACROBID) 100 MG capsule        100 mg Oral Every 12 hours 12/28/22 0737          Assessment/Plan 69F with dementia and frequent falls   Hemorrhagic contusion of cerebrum - NSGY c/s (Dr. Maurice Small), keppra x7d, SLP for cog eval Acute on chronic T12 compression fracture - NSGY c/s, Dr. Maurice Small, nonop, activity as tolerated Anticoagulant use - Kcentra given at [facility] ED. Previous discharge summaries recommended stopping eliquis due to falls. Question per NSGY of cerebral amyloid angiopathy and high risk if AC resumed. Minimum 1w off AC per NSGY.  R pubic rami fx - ortho c/s, Dr. Charlann Boxer, pain control, nonop, WBAT, PT/OT Chronic pain - followed by Dr. Mikael Spray. On subutex, tramadol, benzodiazepines. Home meds reordered 10/15 HTN COPD GERD Hx CVA Cognitive impairment Depression/ anxiety ID - WBC 12, afeb,  UA with many bacteria and moderate leukocytes, start macrobid x 5 days  HTN - home meds reordered 10/15, BP soft overnight, 500 cc bolus  FEN - regular diet VTE- SCDs, start LMWH ID - PO abx for UTI  Dispo - 4E, bolus this AM and recheck labs     I reviewed last 24 h vitals and pain scores, last 48 h intake and output, last 24 h labs and trends, and last 24 h imaging results.    LOS: 7 days    Juliet Rude, [facility] 12/29/2022, 10:27 AM Please see Amion for pager number during day hours 7:00am-4:30pm

## 2022-12-29 NOTE — Plan of Care (Signed)

## 2022-12-29 NOTE — Progress Notes (Signed)
Pt has remained lethargic and hypotensive throughout this shift. Pt BP elevated WNL briefly following a bolus earlier in the day, but has since dropped back to hypotensive state. Pt is arousable and oriented x4 but has been sleeping all day. She has eaten about 25% of breakfast and lunch and refused the ensures between meals. Pt states she hasn't been feeling well today. Trixie Deis, PA and Dr. Hillery Hunter aware.

## 2022-12-30 DIAGNOSIS — D62 Acute posthemorrhagic anemia: Secondary | ICD-10-CM

## 2022-12-30 DIAGNOSIS — K921 Melena: Secondary | ICD-10-CM | POA: Diagnosis not present

## 2022-12-30 LAB — BPAM RBC
Blood Product Expiration Date: 202411172359
Blood Product Expiration Date: 202411172359
ISSUE DATE / TIME: 202410202027
ISSUE DATE / TIME: 202410202319
Unit Type and Rh: 5100
Unit Type and Rh: 5100

## 2022-12-30 LAB — TYPE AND SCREEN
ABO/RH(D): O POS
Antibody Screen: NEGATIVE
Unit division: 0
Unit division: 0

## 2022-12-30 LAB — HEMOGLOBIN AND HEMATOCRIT, BLOOD
HCT: 30.2 % — ABNORMAL LOW (ref 36.0–46.0)
Hemoglobin: 10 g/dL — ABNORMAL LOW (ref 12.0–15.0)

## 2022-12-30 MED ORDER — PANTOPRAZOLE SODIUM 40 MG IV SOLR
40.0000 mg | Freq: Two times a day (BID) | INTRAVENOUS | Status: DC
  Administered 2022-12-30 – 2023-01-05 (×12): 40 mg via INTRAVENOUS
  Filled 2022-12-30 (×12): qty 10

## 2022-12-30 MED ORDER — ENOXAPARIN SODIUM 30 MG/0.3ML IJ SOSY
30.0000 mg | PREFILLED_SYRINGE | Freq: Two times a day (BID) | INTRAMUSCULAR | Status: DC
Start: 2022-12-31 — End: 2022-12-30

## 2022-12-30 MED ORDER — LACTATED RINGERS IV SOLN: INTRAVENOUS | Status: DC

## 2022-12-30 MED ORDER — ENOXAPARIN SODIUM 30 MG/0.3ML IJ SOSY
30.0000 mg | PREFILLED_SYRINGE | Freq: Two times a day (BID) | INTRAMUSCULAR | Status: DC
  Administered 2022-12-31 – 2023-01-05 (×9): 30 mg via SUBCUTANEOUS
  Filled 2022-12-30 (×9): qty 0.3

## 2022-12-30 NOTE — TOC Progression Note (Signed)
Transition of Care [facility]) - Progression Note    Patient Details  Name: [name] MRN: [id number] Date of Birth: 1950/12/15  Transition of Care [facility]) CM/SW Contact  Glennon Mac, RN Phone Number: 12/30/2022, 4:13 PM  Clinical Narrative:    Patient has authorization from insurance for admission to skilled nursing facility; authorization will be good through October 23 at midnight.  If unable to discharge by this time, we will need to resubmit authorization.  Will follow for medical stability.   Expected Discharge Plan: Skilled Nursing Facility Barriers to Discharge: Continued Medical Work up  Expected Discharge Plan and Services     Post Acute Care Choice: Skilled Nursing Facility                                         Social Determinants of Health (SDOH) Interventions SDOH Screenings   Food Insecurity: No Food Insecurity (12/09/2022)  Housing: Low Risk  (12/09/2022)  Transportation Needs: No Transportation Needs (12/09/2022)  Utilities: Not At Risk (12/09/2022)  Social Connections: Unknown (07/21/2021)   Received from [facility], [facility]  Stress: Stress Concern Present (05/10/2020)   Received from [facility], [facility]  Tobacco Use: High Risk (12/22/2022)    Readmission Risk Interventions    12/03/2022   10:10 AM 03/01/2022   11:53 AM  Readmission Risk Prevention Plan  Transportation Screening Complete Complete  Home Care Screening Complete Complete  Medication Review (RN CM)  Complete   Quintella Baton, RN, BSN  Trauma/Neuro ICU Case Manager [phone]

## 2022-12-30 NOTE — Progress Notes (Signed)
Mobility Specialist Progress Note:   12/30/22 1500  Mobility  Activity Ambulated with assistance in room  Level of Assistance Contact guard assist, steadying assist  Assistive Device Front wheel walker  Distance Ambulated (ft) 30 ft  RLE Weight Bearing WBAT  Activity Response Tolerated well  Mobility Referral Yes  $Mobility charge 1 Mobility  Mobility Specialist Start Time (ACUTE ONLY) 1524  Mobility Specialist Stop Time (ACUTE ONLY) 1539  Mobility Specialist Time Calculation (min) (ACUTE ONLY) 15 min    Pt received in bed, agreeable to mobility. C/o some RLE soreness. Otherwise asymptomatic w/ no other complaints. Pt left in bed with call bell and all needs met. Bed alarm on.  D'Vante Earlene Plater Mobility Specialist Please contact via Special educational needs teacher or Rehab office at [phone]

## 2022-12-30 NOTE — Progress Notes (Signed)
Physical Therapy Treatment Patient Details Name: [name] MRN: [id number] DOB: 1950-09-16 Today's Date: 12/30/2022   History of Present Illness [name] is a 72 y.o. female with medical history significant for stroke, chronic pancreatitis, COPD, stage IV decubitus ulcer, diastolic CHF.  Patient presented to the ED from assisted living facility with reports of 2 falls today, and generalized weakness.  Patient tells me she felt weak all over and this resulted in the fall.  No difficulty breathing.  No cough.  No chest pain.  No dizziness.  No vomiting.  No loose stools.  Reports urinary symptoms of frequency and dysuria.  She denies problems with swallowing  She reports chronic unchanged low back pain, chronic unchanged abdominal pain related to her chronic pancreatitis.     Recently hospitalized 9/22 to 9/26 for COPD exacerbation, hypoxic and hypercarbic respiratory failure, also had elevated LFTs, she was to follow-up outpatient for EUS.  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    PT Comments  Pt received in supine and agreeable to session. Pt able to complete mobility tasks with up to CGA for safety, however continues to be limited by RLE pain and fatigue. Pt demonstrates very slow steps with limited RLE WB tolerance. Pt requires increased time for initiation and to complete tasks. Pt continues to benefit from PT services to progress toward functional mobility goals.    If plan is discharge home, recommend the following: Help with stairs or ramp for entrance;Assistance with cooking/housework;A lot of help with walking and/or transfers;A lot of help with bathing/dressing/bathroom;Direct supervision/assist for medications management;Direct supervision/assist for financial management;Assist for transportation;Supervision due to cognitive status   Can travel by private vehicle     No  Equipment Recommendations  None  recommended by PT    Recommendations for Other Services       Precautions / Restrictions Precautions Precautions: Fall Restrictions Weight Bearing Restrictions: Yes RLE Weight Bearing: Weight bearing as tolerated     Mobility  Bed Mobility Overal bed mobility: Needs Assistance Bed Mobility: Supine to Sit     Supine to sit: Contact guard, Used rails     General bed mobility comments: increased time and cues for use of rail    Transfers Overall transfer level: Needs assistance Equipment used: Rolling walker (2 wheels) Transfers: Sit to/from Stand Sit to Stand: Contact guard assist           General transfer comment: cues for hand placement    Ambulation/Gait Ambulation/Gait assistance: Contact guard assist Gait Distance (Feet): 30 Feet Assistive device: Rolling walker (2 wheels) Gait Pattern/deviations: Decreased stride length, Decreased step length - left, Step-to pattern, Trunk flexed, Decreased stance time - right Gait velocity: decreased     General Gait Details: slow step-to pattern with decreased RLE WB tolerance due to pain. cues for upright posture and increased step length       Balance Overall balance assessment: Needs assistance Sitting-balance support: Feet supported, No upper extremity supported Sitting balance-Leahy Scale: Fair Sitting balance - Comments: sitting EOB   Standing balance support: Reliant on assistive device for balance, Bilateral upper extremity supported, During functional activity Standing balance-Leahy Scale: Poor Standing balance comment: with RW support                            Cognition Arousal: Alert Behavior During Therapy: WFL for tasks assessed/performed Overall Cognitive Status: Impaired/Different from  baseline                                 General Comments: increased time for processing and requires increased cues at times        Exercises      General Comments         Pertinent Vitals/Pain Pain Assessment Pain Assessment: Faces Faces Pain Scale: Hurts whole lot Pain Location: RLE Pain Descriptors / Indicators: Aching, Discomfort, Guarding, Sore Pain Intervention(s): Monitored during session, Limited activity within patient's tolerance, Repositioned     PT Goals (current goals can now be found in the care plan section) Acute Rehab PT Goals Patient Stated Goal: figure out why she keeps falling PT Goal Formulation: With patient Time For Goal Achievement: 01/06/23 Progress towards PT goals: Progressing toward goals    Frequency    Min 1X/week       AM-PAC PT "6 Clicks" Mobility   Outcome Measure  Help needed turning from your back to your side while in a flat bed without using bedrails?: A Little Help needed moving from lying on your back to sitting on the side of a flat bed without using bedrails?: A Little Help needed moving to and from a bed to a chair (including a wheelchair)?: A Little Help needed standing up from a chair using your arms (e.g., wheelchair or bedside chair)?: A Little Help needed to walk in hospital room?: A Little Help needed climbing 3-5 steps with a railing? : A Lot 6 Click Score: 17    End of Session Equipment Utilized During Treatment: Gait belt Activity Tolerance: Patient limited by pain;Patient tolerated treatment well;Patient limited by fatigue Patient left: with call bell/phone within reach;in chair;with chair alarm set Nurse Communication: Mobility status PT Visit Diagnosis: Unsteadiness on feet (R26.81);Other abnormalities of gait and mobility (R26.89);Muscle weakness (generalized) (M62.81);Repeated falls (R29.6);Difficulty in walking, not elsewhere classified (R26.2);Pain     Time: 1191-4782 PT Time Calculation (min) (ACUTE ONLY): 24 min  Charges:    $Gait Training: 23-37 mins PT General Charges $$ ACUTE PT VISIT: 1 Visit                     Johny Shock, PTA Acute Rehabilitation  Services Secure Chat Preferred  [phone]    Johny Shock 12/30/2022, 1:52 PM

## 2022-12-30 NOTE — Progress Notes (Signed)
Patient ID: [name], female   DOB: [birth year], 72 y.o.   MRN: [id number] [facility] Surgery Progress Note     Subjective: CC-  Complaining of some mild upper abdominal pain. No n/v. She had a large black stool last night. Post-transfusion h/h still pending. BP improved after blood transfusion.  Objective: Vital signs in last 24 hours: Temp:  [97.5 F (36.4 C)-98.7 F (37.1 C)] 98.6 F (37 C) (10/21 0400) Pulse Rate:  [73-101] 77 (10/21 0807) Resp:  [11-19] 15 (10/21 0807) BP: (92-146)/(39-81) 146/73 (10/21 0800) SpO2:  [90 %-100 %] 97 % (10/21 0807) Last BM Date : 12/29/22  Intake/Output from previous day: 10/20 0701 - 10/21 0700 In: 1793.3 [I.V.:1163.3; Blood:630] Out: 1175 [Urine:1175] Intake/Output this shift: No intake/output data recorded.  PE: Gen:  Alert, NAD HEENT: EOM's intact, pupils equal and round Card:  RRR Pulm:  CTAB, no W/R/R, rate and effort normal on RA Abd: Soft, NT/ND, +BS Ext:  no BUE/BLE edema, calves soft and nontender Skin: no rashes noted, warm and dry  Lab Results:  Recent Labs    12/27/22 1136 12/29/22 1522 12/29/22 1647  WBC 12.2* 8.6  --   HGB 11.7* 6.1* 6.4*  HCT 35.9* 18.9* 20.3*  PLT 411* 309  --    BMET Recent Labs    12/27/22 1136 12/29/22 1522  NA 141 144  K 4.8 4.0  CL 105 112*  CO2 25 24  GLUCOSE 163* 85  BUN 53* 48*  CREATININE 0.83 0.88  CALCIUM 9.2 8.3*   PT/INR No results for input(s): "LABPROT", "INR" in the last 72 hours. CMP     Component Value Date/Time   NA 144 12/29/2022 1522   NA 144 10/14/2016 1030   K 4.0 12/29/2022 1522   CL 112 (H) 12/29/2022 1522   CO2 24 12/29/2022 1522   GLUCOSE 85 12/29/2022 1522   BUN 48 (H) 12/29/2022 1522   BUN 13 10/14/2016 1030   CREATININE 0.88 12/29/2022 1522   CALCIUM 8.3 (L) 12/29/2022 1522   PROT 6.5 12/22/2022 1245   PROT 6.6 10/14/2016 1030   ALBUMIN 3.3 (L) 12/22/2022 1245   ALBUMIN 4.4 10/14/2016 1030   AST 27 12/22/2022 1245   ALT 30  12/22/2022 1245   ALKPHOS 134 (H) 12/22/2022 1245   BILITOT 0.8 12/22/2022 1245   BILITOT 0.2 10/14/2016 1030   GFRNONAA >60 12/29/2022 1522   GFRAA >60 05/06/2019 0936   Lipase     Component Value Date/Time   LIPASE 60 (H) 12/01/2022 1450       Studies/Results: No results found.  Anti-infectives: Anti-infectives (From admission, onward)    Start     Dose/Rate Route Frequency Ordered Stop   12/28/22 1000  nitrofurantoin (macrocrystal-monohydrate) (MACROBID) capsule 100 mg        100 mg Oral Every 12 hours 12/28/22 0736 01/02/23 0959   12/28/22 0000  nitrofurantoin, macrocrystal-monohydrate, (MACROBID) 100 MG capsule        100 mg Oral Every 12 hours 12/28/22 0737          Assessment/Plan 80F with dementia and frequent falls   Hemorrhagic contusion of cerebrum - NSGY c/s (Dr. Maurice Small), keppra x7d, SLP for cog eval Acute on chronic T12 compression fracture - NSGY c/s, Dr. Maurice Small, nonop, activity as tolerated Anticoagulant use - Kcentra given at [facility] ED. Previous discharge summaries recommended stopping eliquis due to falls. Question per NSGY of cerebral amyloid angiopathy and high risk if AC resumed. Minimum 1w off AC per  NSGY.  R pubic rami fx - ortho c/s, Dr. Charlann Boxer, pain control, nonop, WBAT, PT/OT Chronic pain - followed by Dr. Mikael Spray. On subutex, tramadol, benzodiazepines. Home meds reordered 10/15 HTN COPD GERD Hx CVA Cognitive impairment Depression/ anxiety HTN - home meds reordered 10/15 Anemia - Hgb 6.1 on 10/20, given 2u PRBCs, post-transfusion h/h pending but vitals improved. Fecal occult blood positive. Unsure if anemia is 2/2 GIB or injuries. Follow up H/H, considering GI consult pending results. Toradol was stopped. Add PPI BID   FEN - IVF, NPO VTE- SCDs, hold LMWH until h/h is back ID - UA with many bacteria and moderate leukocytes, start macrobid x 5 days 10/19>>   Dispo - 4E. Therapies - rec SNF. Ongoing anemia work up as above.  I  reviewed last 24 h vitals and pain scores, last 48 h intake and output, and last 24 h labs and trends.    LOS: 8 days    Franne Forts, [facility] Surgery 12/30/2022, 9:36 AM Please see Amion for pager number during day hours 7:00am-4:30pm

## 2022-12-30 NOTE — H&P (View-Only) (Signed)
Spoke to son Ethelene Browns. The risks and benefits of EGD with possible biopsies were discussed with the him and is okay with proceeding. Will plan for EGD tomorrow around 11:30 am.

## 2022-12-30 NOTE — Consult Note (Addendum)
Consultation Note   Referring Provider:  General Surgery PCP: Frederich Chick., MD Primary Gastroenterologist: Previously Erick Blinks, MD, more recently Carney Harder, MD        Reason for Consultation: black stools  DOA: 12/22/2022         Hospital Day: 9   ASSESSMENT    Brief Narrative:  72 y.o. year old female with a history of C-diff infection, diverticulosis, GERD, chronic pancreatitis, COPD, HTN, diastolic heart failure. CVA, dementia, decubitus ulcer, chronic pain, fibromyalgia, chronic anticoagulation ( ? For) hysterectomy, appendectomy, cholecystectomy. Admitted 10/13 following mechanical fall  Acute drop in hgb ( 11 range to 6.1) with black stool and hypotension overnight.  Has been on Lovenox inpatient Hgb improved to 10 after only 2 units so the 6.1 may not have been correct though still with significant decline in hgb. BUN elevated over last few days but was actually improving ( 53>> 48) as of yesterday's labs.   Falls / fractures.  Hemorrhagic contusion of cerebrum, acute on chronic T12 compression fracture R pubic fracture following fall at home.   Chronic anticoagulation ( ? For) Home Eliquis is on hold   Barrett's esophagus ( found on EGD Jan 2019) Only on H2 blocker at home.  Up to date on surveillance EGD, last EGD was in Feb 2022  Chronic pancreatitis ( per PMH) and CT AP with contrast shows scattered parenchymal calcifications and intraductal calcifications noted.   Pancreatic lesions on contrast CT scan this admission.  Small cystic structure within the tail of pancreas measures 8 mm.  Stable. MRI 12/03/22 showed multiple uniformly T2 hyperintense cystic lesions within the pancreas. The majority of these all measure less than 1 cm. The largest is in the tail of pancreas measuring 8 mm. These are likely side branch IPMNs.  Colon cancer screening  Chronically elevated alk phos, improved. .  Elevated prior to  bone fractures. Mild intra / extrahepatic bile duct dilation on CT scan this admission . MRCP  /  abrupt decreased caliber in the distal CBD at the ampulla level seen on MRCP done 12/03/22. Tbili is normal.   History of colon polyps. Due for surveillance colonoscopy    Principal Problem:   Focal hemorrhagic contusion of cerebrum (HCC)     PLAN:   --Lovenox discontinued yesterday  --Outpatient GI evaluation of bilary duct dilation ( if indicated)  --Continue BID PPI --In outpatient setting should probably be on PPI instead of H2 blocker for history of Barrett's  --Schedule for EGD. The risks and benefits of EGD with possible biopsies were discussed with the patient who agrees to proceed. .I am also going to contact son Ethelene Browns to discuss.  --NPO after MN --Outpatient polyp surveillance colonoscopy       HPI   Patient is a limited historian. Admitted several days ago with fractures after falls at home. Takes Eliquis at home but now recommended to discontinue given falls.   Patient was getting ready for discharge but overnight had black BM, hypotension and an acute decline in hgb. She has chronic "stomach burning" for which she takes Pepcid at home. Will have abdominal burning if skips a dose. She occasionally takes Motrin. She gives a history of a GI bleed a  few years ago in [place], [place]. It sounds like she had endoscopic evaluation ( she isn't sure) but says nothing was found. She denies N/V, constipation or diarrhea.    Recent GI history:  Seen in hospital by [facility] in late Sept 2024 with elevated LFTs and ? Chronic pancreatitis.   Previous Endoscopies: Screening colonoscopy Jan 2019 - One 7 mm polyp at the hepatic flexure, removed with a cold snare. Resected and retrieved. - One 4 mm polyp in the sigmoid colon, removed with a cold snare. Resected and retrieved. - One 10 mm polyp in the rectum, removed with a hot snare. Resected and retrieved. - Mild diverticulosis in the  sigmoid colon. - External and internal hemorrhoids.  Surgical [P], gastric antrum and gastric body - REACTIVE GASTROPATHY. - THERE IS NO EVIDENCE OF HELICOBACTER PYLORI, DYSPLASIA, OR MALIGNANCY. - SEE COMMENT. 2. Surgical [P], irregular z line, esophagus - INTESTINAL METAPLASIA (GOBLET CELL METAPLASIA) CONSISTENT WITH BARRETT'S ESOPHAGUS. - THERE IS NO EVIDENCE OF DYSPLASIA OR MALIGNANCY. 3. Surgical [P], hepatic flexure, polyp, colon - SESSILE SERRATED POLYP WITHOUT CYTOLOGIC DYSPLASIA. 4. Surgical [P], sigmoid, polyp - SESSILE SERRATED POLYP WITHOUT CYTOLOGIC DYSPLASIA. 5. Surgical [P], rectum, polyp - TUBULAR ADENOMA(S). - HIGH GRADE DYSPLASIA IS NOT IDENTIFIED.   Jan 2019 EGD for N/V and epigastric pain  - Monilial esophagitis. - Z-line irregular, 35 cm from the incisors. Biopsied. - Gastritis. Biopsied. - Normal examined duodenum. Biopsies as above   Feb 2022 EGD for N/V and epigastric pain  - Moderate gastritis, biopsiesd to check for H. pylori. - GE junction was normal. The slightly irregular Z line noted 2019 appeared normal today, not biopsied. - The examination was otherwise normal. A. STOMACH, BODY, BIOPSY:  - Antral and oxyntic mucosa with slight inflammation and hyperemia.  - Warthin-Starry negative for Helicobacter pylori.  - No intestinal metaplasia, dysplasia or carcinoma.     Labs and Imaging: Recent Labs    12/29/22 1522 12/29/22 1647 12/30/22 1014  WBC 8.6  --   --   HGB 6.1* 6.4* 10.0*  HCT 18.9* 20.3* 30.2*  PLT 309  --   --    Recent Labs    12/29/22 1522  NA 144  K 4.0  CL 112*  CO2 24  GLUCOSE 85  BUN 48*  CREATININE 0.88  CALCIUM 8.3*   No results for input(s): "PROT", "ALBUMIN", "AST", "ALT", "ALKPHOS", "BILITOT", "BILIDIR", "IBILI" in the last 72 hours. No results for input(s): "HEPBSAG", "HCVAB", "HEPAIGM", "HEPBIGM" in the last 72 hours. No results for input(s): "LABPROT", "INR" in the last 72 hours.    Past Medical History:   Diagnosis Date   Acute blood loss anemia    Arrhythmia    Arthritis    C. difficile colitis    Cancer (HCC)    COPD (chronic obstructive pulmonary disease) (HCC)    Diverticulosis    Fibromyalgia    GERD (gastroesophageal reflux disease)    GI hemorrhage    H/O: substance abuse (HCC) 2005   narcotic usage due to chronic back pain   Heart murmur    Hypertension    Pancreatic cyst    Pneumococcal meningitis    PONV (postoperative nausea and vomiting)    Renal cyst    Stroke (cerebrum) [facility])     Past Surgical History:  Procedure Laterality Date   ABDOMINAL HYSTERECTOMY     ABDOMINAL SURGERY     APPENDECTOMY     BIOPSY  04/22/2020   Procedure: BIOPSY;  Surgeon:  Rachael Fee, MD;  Location: [facility];  Service: Gastroenterology;;   BREAST SURGERY     CHOLECYSTECTOMY N/A 09/30/2012   Procedure: LAPAROSCOPIC CHOLECYSTECTOMY WITH INTRAOPERATIVE CHOLANGIOGRAM;  Surgeon: Wilmon Arms. Corliss Skains, MD;  Location: [facility];  Service: General;  Laterality: N/A;   ESOPHAGOGASTRODUODENOSCOPY (EGD) WITH PROPOFOL N/A 04/22/2020   Procedure: ESOPHAGOGASTRODUODENOSCOPY (EGD) WITH PROPOFOL;  Surgeon: Rachael Fee, MD;  Location: [facility];  Service: Gastroenterology;  Laterality: N/A;   FRACTURE SURGERY     right upper arm   OVARIAN CYST SURGERY      Family History  Problem Relation Age of Onset   Alzheimer's disease Mother    Heart disease Father    Prostate cancer Father     Prior to Admission medications   Medication Sig Start Date End Date Taking? Authorizing Provider  albuterol (VENTOLIN HFA) 108 (90 Base) MCG/ACT inhaler Inhale 2 puffs into the lungs every 4 (four) hours as needed for wheezing or shortness of breath. 02/28/22  Yes [provider]  apixaban (ELIQUIS) 5 MG TABS tablet Take 5 mg by mouth 2 (two) times daily.   Yes [provider]  ARIPiprazole (ABILIFY) 5 MG tablet Take 5 mg by mouth daily. 12/20/22  Yes [provider]  ascorbic acid  (VITAMIN C) 500 MG tablet Take 500 mg by mouth daily.   Yes [provider]  buprenorphine (SUBUTEX) 8 MG SUBL SL tablet Place 4 mg under the tongue every 6 (six) hours.   Yes [provider]  Cholecalciferol (VITAMIN D3) 50 MCG (2000 UT) capsule Take 2,000 Units by mouth 4 (four) times daily.   Yes [provider]  ciprofloxacin (CILOXAN) 0.3 % ophthalmic solution Place 2 drops into both eyes See admin instructions. Place 2 drops in both eyes every 2 hours while awake for 2 days, then place 2 drops in both eyes every 4 hours while awake for 5 days. 12/19/22  Yes [provider]  donepezil (ARICEPT) 10 MG tablet Take 10 mg by mouth daily. 02/15/22  Yes [provider]  doxepin (SINEQUAN) 50 MG capsule Take 50 mg by mouth daily.   Yes [provider]  famotidine (PEPCID) 20 MG tablet Take 20 mg by mouth daily. 12/20/22  Yes [provider]  fluticasone (FLONASE) 50 MCG/ACT nasal spray Place 1 spray into both nostrils 2 (two) times daily as needed for allergies. 11/12/22  Yes [provider]  ipratropium (ATROVENT) 0.03 % nasal spray Place 2 sprays into both nostrils See admin instructions. Use 2 sprays in each nostril once every three hours as needed for rhinitis.   Yes [provider]  levocetirizine (XYZAL) 5 MG tablet Take 5 mg by mouth daily. 11/22/22  Yes [provider]  LORazepam (ATIVAN) 0.5 MG tablet Take 0.5 mg by mouth 2 (two) times daily. Do not administer AFTER 3   Yes [provider]  losartan (COZAAR) 100 MG tablet Take 0.5 tablets (50 mg total) by mouth daily. Patient taking differently: Take 100 mg by mouth daily. 12/10/22  Yes Vassie Loll, MD  MAGNESIUM CITRATE PO Take 1 capsule by mouth 3 (three) times daily.   Yes [provider]  magnesium oxide (MAG-OX) 400 (240 Mg) MG tablet Take 1 tablet by mouth daily. 11/27/22  Yes [provider]  mirtazapine (REMERON) 7.5 MG  tablet Take 7.5 mg by mouth at bedtime. 02/20/22  Yes [provider]  Multiple Vitamins-Minerals (THERA-TABS M) TABS Take 1 tablet by mouth daily. 11/27/22  Yes [provider]  Nutritional Supplements (ENSURE ORIGINAL) LIQD Take 1 Container by mouth 3 (three) times daily. 12/19/22  Yes [provider]  OLANZapine (ZYPREXA) 10 MG tablet Take 1 tablet (10 mg total) by mouth at bedtime. 03/08/22  Yes Shah, Pratik D, DO  Omega-3 Fatty Acids (FISH OIL) 1000 MG CAPS Take 1 capsule by mouth 3 (three) times daily. 12/20/22  Yes [provider]  Prasterone (INTRAROSA) 6.5 MG INST Place 1 suppository vaginally at bedtime.   Yes [provider]  temazepam (RESTORIL) 22.5 MG capsule Take 22.5 mg by mouth at bedtime.   Yes [provider]  traMADol (ULTRAM) 50 MG tablet Take 75 mg by mouth 4 (four) times daily.   Yes [provider]  traZODone (DESYREL) 50 MG tablet Take 25 mg by mouth at bedtime. 11/27/22  Yes [provider]  TRELEGY ELLIPTA 100-62.5-25 MCG/ACT AEPB Inhale 1 puff into the lungs daily. 02/28/22  Yes [provider]  acetaminophen (TYLENOL) 500 MG tablet Take 2 tablets (1,000 mg total) by mouth every 6 (six) hours. 12/27/22   Juliet Rude, PA-C  carvedilol (COREG) 6.25 MG tablet Take 2 tablets (12.5 mg total) by mouth 2 (two) times daily. 12/28/22   Juliet Rude, PA-C  docusate sodium (COLACE) 100 MG capsule Take 1 capsule (100 mg total) by mouth 2 (two) times daily. 12/27/22   Juliet Rude, PA-C  methocarbamol (ROBAXIN) 500 MG tablet Take 1 tablet (500 mg total) by mouth every 6 (six) hours as needed for muscle spasms. 12/27/22   Juliet Rude, PA-C  nitrofurantoin, macrocrystal-monohydrate, (MACROBID) 100 MG capsule Take 1 capsule (100 mg total) by mouth every 12 (twelve) hours. 12/28/22   Juliet Rude, PA-C  polyethylene glycol (MIRALAX / GLYCOLAX) 17 g packet Take 17 g by mouth daily. 12/28/22    Juliet Rude, PA-C  tamsulosin (FLOMAX) 0.4 MG CAPS capsule Take 1 capsule (0.4 mg total) by mouth daily. 12/28/22   Juliet Rude, PA-C    Current Facility-Administered Medications  Medication Dose Route Frequency Provider Last Rate Last Admin   acetaminophen (TYLENOL) tablet 1,000 mg  1,000 mg Oral Q6H Kinsinger, De Blanch, MD   1,000 mg at 12/30/22 1152   albuterol (PROVENTIL) (2.5 MG/3ML) 0.083% nebulizer solution 2.5 mg  2.5 mg Nebulization Q4H PRN Cedric Fishman, RPH       ARIPiprazole (ABILIFY) tablet 5 mg  5 mg Oral Daily Meuth, Brooke A, PA-C   5 mg at 12/30/22 1149   ascorbic acid (VITAMIN C) tablet 500 mg  500 mg Oral Daily Meuth, Brooke A, PA-C   500 mg at 12/30/22 1148   buprenorphine (SUBUTEX) SL tablet 4 mg  4 mg Sublingual Q6H Meuth, Brooke A, PA-C   4 mg at 12/30/22 1149   carvedilol (COREG) tablet 6.25 mg  6.25 mg Oral BID Juliet Rude, PA-C   6.25 mg at 12/30/22 1148   cholecalciferol (VITAMIN D3) 25 MCG (1000 UNIT) tablet 1,000 Units  1,000 Units Oral Daily Meuth, Brooke A, PA-C   1,000 Units at 12/30/22 1148   docusate sodium (COLACE) capsule 100 mg  100 mg Oral BID Kinsinger, De Blanch, MD   100 mg at 12/30/22 1148   donepezil (ARICEPT) tablet 10 mg  10 mg Oral Daily Meuth, Brooke A, PA-C   10 mg at 12/30/22 1148   doxepin (SINEQUAN) capsule 50 mg  50 mg Oral Daily Meuth, Brooke A, PA-C   50 mg at  12/30/22 1147   [START ON 12/31/2022] enoxaparin (LOVENOX) injection 30 mg  30 mg Subcutaneous Q12H Meuth, Brooke A, PA-C       famotidine (PEPCID) tablet 20 mg  20 mg Oral Daily Meuth, Brooke A, PA-C   20 mg at 12/30/22 1148   feeding supplement (BOOST / RESOURCE BREEZE) liquid 1 Container  1 Container Oral TID BM Meuth, Brooke A, PA-C   1 Container at 12/28/22 2231   feeding supplement (ENSURE ENLIVE / ENSURE PLUS) liquid 237 mL  237 mL Oral BID BM Meuth, Brooke A, PA-C   237 mL at 12/29/22 1000   fluticasone (FLONASE) 50 MCG/ACT nasal spray 1 spray  1 spray Each Nare  BID PRN Meuth, Brooke A, PA-C       fluticasone furoate-vilanterol (BREO ELLIPTA) 100-25 MCG/ACT 1 puff  1 puff Inhalation Daily Meuth, Brooke A, PA-C   1 puff at 12/30/22 0809   And   umeclidinium bromide (INCRUSE ELLIPTA) 62.5 MCG/ACT 1 puff  1 puff Inhalation Daily Meuth, Brooke A, PA-C   1 puff at 12/30/22 0809   hydrALAZINE (APRESOLINE) injection 10 mg  10 mg Intravenous Q2H PRN Kinsinger, De Blanch, MD   10 mg at 12/23/22 2353   ipratropium (ATROVENT) 0.03 % nasal spray 2 spray  2 spray Each Nare Q8H PRN Meuth, Brooke A, PA-C       lactated ringers infusion   Intravenous Continuous Meuth, Brooke A, PA-C 100 mL/hr at 12/30/22 1153 Rate Change at 12/30/22 1153   loratadine (CLARITIN) tablet 10 mg  10 mg Oral Daily Cedric Fishman, RPH   10 mg at 12/30/22 1148   LORazepam (ATIVAN) tablet 0.5 mg  0.5 mg Oral BID Meuth, Brooke A, PA-C   0.5 mg at 12/30/22 1148   losartan (COZAAR) tablet 100 mg  100 mg Oral Daily Meuth, Brooke A, PA-C   100 mg at 12/30/22 1148   methocarbamol (ROBAXIN) tablet 500 mg  500 mg Oral Q6H Meuth, Brooke A, PA-C   500 mg at 12/30/22 1148   metoprolol tartrate (LOPRESSOR) injection 5 mg  5 mg Intravenous Q6H PRN Adam Phenix, PA-C   5 mg at 12/27/22 1441   mirtazapine (REMERON) tablet 7.5 mg  7.5 mg Oral QHS Meuth, Brooke A, PA-C   7.5 mg at 12/28/22 2226   nitrofurantoin (macrocrystal-monohydrate) (MACROBID) capsule 100 mg  100 mg Oral Q12H Juliet Rude, PA-C   100 mg at 12/30/22 1148   OLANZapine (ZYPREXA) tablet 10 mg  10 mg Oral QHS Meuth, Brooke A, PA-C   10 mg at 12/28/22 2226   Oral care mouth rinse  15 mL Mouth Rinse PRN Stechschulte, Hyman Hopes, MD       oxyCODONE (Oxy IR/ROXICODONE) immediate release tablet 2.5-5 mg  2.5-5 mg Oral Q4H PRN Diamantina Monks, MD   5 mg at 12/30/22 0808   pantoprazole (PROTONIX) injection 40 mg  40 mg Intravenous Q12H Meuth, Brooke A, PA-C   40 mg at 12/30/22 1152   polyethylene glycol (MIRALAX / GLYCOLAX) packet 17 g  17 g Oral  Daily Meuth, Brooke A, PA-C   17 g at 12/30/22 1149   tamsulosin (FLOMAX) capsule 0.4 mg  0.4 mg Oral Daily Diamantina Monks, MD   0.4 mg at 12/30/22 1149   temazepam (RESTORIL) capsule 22.5 mg  22.5 mg Oral QHS PRN Meuth, Brooke A, PA-C       traZODone (DESYREL) tablet 25 mg  25 mg Oral QHS Meuth, Brooke A,  PA-C   25 mg at 12/28/22 2226    Allergies as of 12/22/2022 - Review Complete 12/22/2022  Allergen Reaction Noted   Nsaids Other (See Comments) and Nausea And Vomiting 08/19/2012   Tylenol [acetaminophen] Other (See Comments) 11/13/2016   Codeine Nausea And Vomiting 08/19/2012   Tolmetin Other (See Comments) 08/19/2012   Tramadol Other (See Comments) 06/06/2017    Social History   Socioeconomic History   Marital status: Widowed    Spouse name: Not on file   Number of children: Not on file   Years of education: Not on file   Highest education level: Not on file  Occupational History   Not on file  Tobacco Use   Smoking status: Every Day    Current packs/day: 0.40    Average packs/day: 0.4 packs/day for 49.0 years (19.6 ttl pk-yrs)    Types: Cigarettes   Smokeless tobacco: Never  Vaping Use   Vaping status: Never Used  Substance and Sexual Activity   Alcohol use: No   Drug use: No    Comment: Methadone for narcotic use   Sexual activity: Not Currently  Other Topics Concern   Not on file  Social History Narrative   Not on file   Social Determinants of Health   Financial Resource Strain: Not on file  Food Insecurity: No Food Insecurity (12/09/2022)   Hunger Vital Sign    Worried About Running Out of Food in the Last Year: Never true    Ran Out of Food in the Last Year: Never true  Transportation Needs: No Transportation Needs (12/09/2022)   PRAPARE - Administrator, Civil Service (Medical): No    Lack of Transportation (Non-Medical): No  Physical Activity: Not on file  Stress: Stress Concern Present (05/10/2020)   Received from [facility] - Occupational Stress Questionnaire    Feeling of Stress : Very much  Social Connections: Unknown (07/21/2021)   Received from [facility]   Social Network    Social Network: Not on file  Intimate Partner Violence: Not At Risk (12/09/2022)   Humiliation, Afraid, Rape, and Kick questionnaire    Fear of Current or Ex-Partner: No    Emotionally Abused: No    Physically Abused: No    Sexually Abused: No     Code Status   Code Status: Do not attempt resuscitation (DNR) PRE-ARREST INTERVENTIONS DESIRED  Review of Systems: All systems reviewed and negative except where noted in HPI.  Physical Exam: Vital signs in last 24 hours: Temp:  [97.5 F (36.4 C)-98.7 F (37.1 C)] 97.8 F (36.6 C) (10/21 1201) Pulse Rate:  [73-101] 88 (10/21 1201) Resp:  [11-19] 16 (10/21 1201) BP: (92-152)/(39-81) 115/74 (10/21 1201) SpO2:  [90 %-100 %] 100 % (10/21 1201) Last BM Date : 12/29/22  General:  Female in NAD Psych:  Cooperative. Flat affect. Eyes: Pupils equal Ears:  Normal auditory acuity Nose: No deformity, discharge or lesions Neck:  Supple, no masses felt Lungs:  Clear to auscultation.  Heart:  Regular rate, regular rhythm.  Abdomen:  Soft, nondistended, nontender, active bowel sounds, no masses felt Rectal :  Deferred Msk: Symmetrical without gross deformities.  Neurologic:  Alert, oriented, grossly normal neurologically Extremities : No edema Skin:  Intact without significant lesions.    Intake/Output from previous day: 10/20 0701 - 10/21 0700 In: 1793.3 [I.V.:1163.3; Blood:630] Out: 1175 [Urine:1175] Intake/Output this shift:  Total I/O In: -  Out: 450 [Urine:450]   Willette Cluster, NP-C   12/30/2022, 1:16 PM

## 2022-12-30 NOTE — Progress Notes (Signed)
Spoke to son Ethelene Browns. The risks and benefits of EGD with possible biopsies were discussed with the him and is okay with proceeding. Will plan for EGD tomorrow around 11:30 am.

## 2022-12-31 ENCOUNTER — Inpatient Hospital Stay (HOSPITAL_COMMUNITY): Payer: 59 | Admitting: Anesthesiology

## 2022-12-31 ENCOUNTER — Encounter (HOSPITAL_COMMUNITY)
Admission: EM | Disposition: A | Discharge status: Skilled Nursing Facility | Payer: Self-pay | Source: Skilled Nursing Facility

## 2022-12-31 DIAGNOSIS — I10 Essential (primary) hypertension: Secondary | ICD-10-CM

## 2022-12-31 DIAGNOSIS — K921 Melena: Secondary | ICD-10-CM | POA: Diagnosis not present

## 2022-12-31 DIAGNOSIS — D62 Acute posthemorrhagic anemia: Secondary | ICD-10-CM | POA: Diagnosis not present

## 2022-12-31 DIAGNOSIS — T18128A Food in esophagus causing other injury, initial encounter: Secondary | ICD-10-CM

## 2022-12-31 HISTORY — PX: ESOPHAGOGASTRODUODENOSCOPY (EGD) WITH PROPOFOL: SHX5813

## 2022-12-31 HISTORY — PX: FOREIGN BODY REMOVAL: SHX962

## 2022-12-31 LAB — CBC
HCT: 31.2 % — ABNORMAL LOW (ref 36.0–46.0)
Hemoglobin: 10.4 g/dL — ABNORMAL LOW (ref 12.0–15.0)
MCH: 28.3 pg (ref 26.0–34.0)
MCHC: 33.3 g/dL (ref 30.0–36.0)
MCV: 85 fL (ref 80.0–100.0)
Platelets: 440 10*3/uL — ABNORMAL HIGH (ref 150–400)
RBC: 3.67 MIL/uL — ABNORMAL LOW (ref 3.87–5.11)
RDW: 17.1 % — ABNORMAL HIGH (ref 11.5–15.5)
WBC: 10 10*3/uL (ref 4.0–10.5)
nRBC: 0 % (ref 0.0–0.2)

## 2022-12-31 SURGERY — ESOPHAGOGASTRODUODENOSCOPY (EGD) WITH PROPOFOL
Anesthesia: Monitor Anesthesia Care

## 2022-12-31 MED ORDER — SODIUM CHLORIDE 0.9 % IV SOLN: INTRAVENOUS | Status: DC | PRN

## 2022-12-31 MED ORDER — PROPOFOL 10 MG/ML IV BOLUS: INTRAVENOUS | Status: DC | PRN

## 2022-12-31 MED ORDER — PEG-KCL-NACL-NASULF-NA ASC-C 100 G PO SOLR
0.5000 | Freq: Once | ORAL | Status: AC
  Administered 2022-12-31: 100 g via ORAL
  Filled 2022-12-31: qty 1

## 2022-12-31 MED ORDER — EPHEDRINE SULFATE (PRESSORS) 50 MG/ML IJ SOLN: INTRAMUSCULAR | Status: DC | PRN

## 2022-12-31 MED ORDER — PHENYLEPHRINE HCL (PRESSORS) 10 MG/ML IV SOLN
INTRAVENOUS | Status: DC | PRN
  Administered 2022-12-31 (×3): 100 ug via INTRAVENOUS

## 2022-12-31 MED ORDER — SUCCINYLCHOLINE 20MG/ML (10ML) SYRINGE FOR MEDFUSION PUMP - OPTIME
INTRAMUSCULAR | Status: DC | PRN
  Administered 2022-12-31: 80 mg via INTRAVENOUS

## 2022-12-31 MED ORDER — LIDOCAINE HCL (CARDIAC) PF 100 MG/5ML IV SOSY
PREFILLED_SYRINGE | INTRAVENOUS | Status: DC | PRN
  Administered 2022-12-31: 60 mg via INTRATRACHEAL

## 2022-12-31 MED ORDER — PROPOFOL 500 MG/50ML IV EMUL: INTRAVENOUS | Status: DC | PRN

## 2022-12-31 MED ORDER — PEG-KCL-NACL-NASULF-NA ASC-C 100 G PO SOLR
0.5000 | Freq: Once | ORAL | Status: AC
  Administered 2023-01-01: 100 g via ORAL
  Filled 2022-12-31: qty 1

## 2022-12-31 MED ORDER — PROPOFOL 10 MG/ML IV BOLUS
INTRAVENOUS | Status: DC | PRN
  Administered 2022-12-31: 30 mg via INTRAVENOUS
  Administered 2022-12-31 (×2): 50 mg via INTRAVENOUS

## 2022-12-31 MED ORDER — PHENYLEPHRINE HCL (PRESSORS) 10 MG/ML IV SOLN: INTRAVENOUS | Status: DC | PRN

## 2022-12-31 SURGICAL SUPPLY — 15 items

## 2022-12-31 NOTE — Op Note (Signed)
[facility] Patient Name: [name] Procedure Date : 12/31/2022 MRN: [id number] Attending MD: Willaim Rayas. Adela Lank , MD, [id number] Date of Birth: October 05, 1950 CSN: [id number] Age: 72 Admit Type: Inpatient Procedure:                Upper GI endoscopy Indications:              Suspected upper gastrointestinal bleeding - melena,                            rise in BUN. Son reports similar episode in 2018                            when hospitalized at [facility] where she had an                            evaluation that was also negative at the time. Providers:                Willaim Rayas. Adela Lank, MD, Lorenza Evangelist, RN,                            Marja Kays, Technician Referring MD:              Medicines:                Monitored Anesthesia Care Complications:            No immediate complications. Estimated blood loss:                            Minimal. Estimated Blood Loss:     Estimated blood loss was minimal. Procedure:                Pre-Anesthesia Assessment:                           - Prior to the procedure, a History and Physical                            was performed, and patient medications and                            allergies were reviewed. The patient's tolerance of                            previous anesthesia was also reviewed. The risks                            and benefits of the procedure and the sedation                            options and risks were discussed with the patient.                            All questions were answered, and informed consent  was obtained. Prior Anticoagulants: The patient has                            taken no anticoagulant or antiplatelet agents. ASA                            Grade Assessment: IV - A patient with severe                            systemic disease that is a constant threat to life.                            After reviewing the risks and benefits, the patient                             was deemed in satisfactory condition to undergo the                            procedure.                           After obtaining informed consent, the endoscope was                            passed under direct vision. Throughout the                            procedure, the patient's blood pressure, pulse, and                            oxygen saturations were monitored continuously. The                            GIF-H190 ([id number]) Olympus endoscope was introduced                            through the mouth, and advanced to the second part                            of duodenum. The PCF-190TL ([id number]) Olympus                            colonoscope was introduced through the mouth, and                            advanced to the jejunum. The upper GI endoscopy was                            accomplished without difficulty. The patient                            tolerated the procedure well. Scope In: Scope Out: Findings:      The Z-line was regular.      A small  hiatal hernia was present.      Retained vegetable matter was found in the upper third of the esophagus       and pushed into the stomach. No obvious stricture / stenosis was noted.      The exam of the esophagus was otherwise normal. Upon withdrawal of       pediatric colonscope, superficial mucosal trauma with slight oozing       noted at the [facility] which resolved with monitoring.      A medium amount of food (residue) was found in the gastric fundus. The       patient was electively intubated per anesthesia given this finding. The       food was able to be pushed to the antrum using suction / [name] to get       mostly adequate views of the fundus without any concerning pathology.      The exam of the stomach was otherwise normal.      The examined duodenum was normal. Of note, regular endoscope looped in       the stomach and had difficulty traversing the duodenal sweep. This was        removed and a pediatric colonoscope was used to intubate the distal       duodenum.      The examined proximal jejunum was reached and normal. Impression:               - Z-line regular.                           - Small hiatal hernia.                           - Food in the upper third of the esophagus and                            pushed into the stomach.                           - A medium amount of food (residue) in the stomach,                            proximal stomach - patient electively intubated and                            mostly adequate views obtained without any                            concerning pathology.                           - Normal examined duodenum. Pediatric colonoscope                            used to intubate the duodenum due to looping in the                            stomach. Ampulla appeared normal (in regards to  prior MRCP findings)                           - Normal examined proximal jejunum which was deeply                            intubated.                           No heme or causing for bleeding noted anywhere on                            this exam. Recommendation:           - Return patient to hospital ward for ongoing care.                           - Clear liquid diet.                           - Continue present medications.                           - I called the patient's son and explained findings                            and options. He reports very similar episode                            happened while hospitalized at [facility] in 2018 and no                            clear cause found. Given rise in BUN suspect she                            had a small bowel bleed, but is overdue for                            colonoscopy given history of polyps. We can either                            do colonoscopy and then capsule endoscopy if                            colonoscopy is negative, vs. start with  capsule and                            then colonoscopy if negative (this would lead to 2                            preps however), vs. monitor while off                            anticoagulation. He feels if the patient is up for  it, do the colonoscopy as she is overdue for it and                            then proceed with capsule if negative. It does not                            sound like she has had a capsule before. CT Scan                            shows no clear cause for her bleeding                           - I will discuss options with the patient - if she                            is up for it, clear liquids today, bowel prep                            tonight, colonoscopy tomorrow Procedure Code(s):        --- Professional ---                           [phone], Esophagogastroduodenoscopy, flexible,                            transoral; diagnostic, including collection of                            specimen(s) by brushing or washing, when performed                            (separate procedure) Diagnosis Code(s):        --- Professional ---                           U04.540J, Food in esophagus causing other injury,                            initial encounter CPT copyright 2022 American Medical Association. All rights reserved. The codes documented in this report are preliminary and upon coder review may  be revised to meet current compliance requirements. Viviann Spare P. Moneisha Vosler, MD 12/31/2022 9:47:50 AM This report has been signed electronically. Number of Addenda: 0

## 2022-12-31 NOTE — Anesthesia Preprocedure Evaluation (Signed)
Anesthesia Evaluation  Patient identified by MRN, date of birth, ID band Patient awake    Reviewed: Allergy & Precautions, H&P , NPO status , Patient's Chart, lab work & pertinent test results  Airway Mallampati: II  TM Distance: >3 FB Neck ROM: Full    Dental no notable dental hx.    Pulmonary COPD, Current Smoker and Patient abstained from smoking.   Pulmonary exam normal breath sounds clear to auscultation       Cardiovascular hypertension, Normal cardiovascular exam Rhythm:Regular Rate:Normal     Neuro/Psych CVA, Residual Symptoms  negative psych ROS   GI/Hepatic negative GI ROS,,,(+)     substance abuse    Endo/Other  negative endocrine ROS    Renal/GU negative Renal ROS  negative genitourinary   Musculoskeletal negative musculoskeletal ROS (+)    Abdominal   Peds negative pediatric ROS (+)  Hematology  (+) Blood dyscrasia, anemia   Anesthesia Other Findings   Reproductive/Obstetrics negative OB ROS                             Anesthesia Physical Anesthesia Plan  ASA: 4  Anesthesia Plan: MAC   Post-op Pain Management: Minimal or no pain anticipated   Induction: Intravenous  PONV Risk Score and Plan: 2 and Treatment may vary due to age or medical condition  Airway Management Planned: Nasal Cannula  Additional Equipment:   Intra-op Plan:   Post-operative Plan:   Informed Consent: I have reviewed the patients History and Physical, chart, labs and discussed the procedure including the risks, benefits and alternatives for the proposed anesthesia with the patient or authorized representative who has indicated his/her understanding and acceptance.     Dental advisory given  Plan Discussed with: CRNA and Surgeon  Anesthesia Plan Comments:        Anesthesia Quick Evaluation

## 2022-12-31 NOTE — Anesthesia Postprocedure Evaluation (Signed)
Anesthesia Post Note  Patient: [name]  Procedure(s) Performed: ESOPHAGOGASTRODUODENOSCOPY (EGD) WITH PROPOFOL FOREIGN BODY REMOVAL     Patient location during evaluation: PACU Anesthesia Type: General Level of consciousness: awake and alert Pain management: pain level controlled Vital Signs Assessment: post-procedure vital signs reviewed and stable Respiratory status: spontaneous breathing, nonlabored ventilation, respiratory function stable and patient connected to nasal cannula oxygen Cardiovascular status: blood pressure returned to baseline and stable Postop Assessment: no apparent nausea or vomiting Anesthetic complications: no  There were no known notable events for this encounter.  Last Vitals:  Vitals:   12/31/22 0940 12/31/22 0950  BP: 131/66 134/73  Pulse:  85  Resp: 14 12  Temp:    SpO2:  100%    Last Pain:  Vitals:   12/31/22 0937  TempSrc: Temporal  PainSc: 7                  Gavyn Zoss S

## 2022-12-31 NOTE — Anesthesia Procedure Notes (Signed)
Procedure Name: Intubation Date/Time: 12/31/2022 9:03 AM  Performed by: Hessie Diener, CRNAPre-anesthesia Checklist: Patient identified, Emergency Drugs available, Suction available and Patient being monitored Patient Re-evaluated:Patient Re-evaluated prior to induction Oxygen Delivery Method: Circle System Utilized Preoxygenation: Pre-oxygenation with 100% oxygen Induction Type: IV induction Ventilation: Mask ventilation without difficulty Laryngoscope Size: Mac and 3 Grade View: Grade I Tube type: Oral Tube size: 7.0 mm Number of attempts: 1 Airway Equipment and Method: Stylet and Oral airway Placement Confirmation: ETT inserted through vocal cords under direct vision, positive ETCO2 and breath sounds checked- equal and bilateral Tube secured with: Tape Dental Injury: Teeth and Oropharynx as per pre-operative assessment

## 2022-12-31 NOTE — Progress Notes (Signed)
Occupational Therapy Treatment Patient Details Name: [name] MRN: [id number] DOB: 05-Oct-1950 Today's Date: 12/31/2022   History of present illness Pt is 72 yo female who presents on 12/22/22 after being found down at ALF and with AMS. Pt with head contusion and nondisplaced fxs of the R inferior and superior pubic rami, and R sacral wing. Progression of T12 compression fx and chronic T11, L1, L3 compression fxs. EGD 10/22, colonscopy scheduled for 10/23. PMH: COPD, CHF, GERD, HTN, CVA, fibromyalgia, sacral decubitus, meningitis, chronic pancreatitis. 2 recent admissions from falls, COPD exacerbation, and respiratory failure   OT comments  Pt making steady progress towards OT goals. Pt able to demonstrate [facility] transfers with RW with Min A and progress to CGA for mobility in room. Pt able to assist with toileting tasks today w/ Min A. Continued hands on assist needed to ensure balance maintenance in setting of endurance deficits and RLE pain. Patient will benefit from continued inpatient follow up therapy, <3 hours/day at DC.      If plan is discharge home, recommend the following:  Direct supervision/assist for financial management;Direct supervision/assist for medications management;A little help with walking and/or transfers;A little help with bathing/dressing/bathroom   Equipment Recommendations  None recommended by OT    Recommendations for Other Services      Precautions / Restrictions Precautions Precautions: Fall Restrictions Weight Bearing Restrictions: Yes RLE Weight Bearing: Weight bearing as tolerated       Mobility Bed Mobility Overal bed mobility: Needs Assistance Bed Mobility: Supine to Sit, Sit to Supine     Supine to sit: Min assist, HOB elevated, Used rails Sit to supine: Min assist   General bed mobility comments: assist to lift trunk and manuever RLE to/from EOB    Transfers Overall transfer level: Needs assistance Equipment used: Rolling walker (2  wheels) Transfers: Sit to/from Stand, Bed to chair/wheelchair/BSC Sit to Stand: Contact guard assist     Step pivot transfers: Min assist     General transfer comment: Min A to guide hips to BSC from bed. CGA to stand from bedside and BSC     Balance Overall balance assessment: Needs assistance Sitting-balance support: Feet supported, No upper extremity supported Sitting balance-Leahy Scale: Good     Standing balance support: Reliant on assistive device for balance, Bilateral upper extremity supported, During functional activity Standing balance-Leahy Scale: Poor                             ADL either performed or assessed with clinical judgement   ADL Overall ADL's : Needs assistance/impaired     Grooming: Contact guard assist;Standing;Wash/dry hands Grooming Details (indicate cue type and reason): cues for turning with RW and keeping in front at sink. able to reach for items with CGA                 Toilet Transfer: Minimal assistance;Stand-pivot;BSC/3in1;Rolling walker (2 wheels) Toilet Transfer Details (indicate cue type and reason): Min A for balance, DME mgmt and hip guidance to [facility] w/ urine urgency noted Toileting- Clothing Manipulation and Hygiene: Minimal assistance;Sitting/lateral lean;Sit to/from stand Toileting - Clothing Manipulation Details (indicate cue type and reason): assist for clothing mgmt, able to perform hygiene seated on BSC     Functional mobility during ADLs: Contact guard assist;Rolling walker (2 wheels)      Extremity/Trunk Assessment Upper Extremity Assessment Upper Extremity Assessment: Generalized weakness;Right hand dominant   Lower Extremity Assessment Lower Extremity Assessment: Defer  to PT evaluation        Vision   Vision Assessment?: No apparent visual deficits   Perception     Praxis      Cognition Arousal: Alert Behavior During Therapy: WFL for tasks assessed/performed Overall Cognitive Status:  Impaired/Different from baseline Area of Impairment: Attention, Memory, Safety/judgement, Awareness, Problem solving                   Current Attention Level: Selective Memory: Decreased short-term memory Following Commands: Follows one step commands consistently, Follows one step commands with increased time Safety/Judgement: Decreased awareness of safety Awareness: Emergent Problem Solving: Slow processing, Requires verbal cues General Comments: pleasant, follows directions consistently. showing some insight into deficits but does need cues for safety and pacing        Exercises      Shoulder Instructions       General Comments      Pertinent Vitals/ Pain       Pain Assessment Pain Assessment: 0-10 Pain Score: 8  Pain Location: RLE Pain Descriptors / Indicators: Throbbing, Sore Pain Intervention(s): Monitored during session, Limited activity within patient's tolerance, Other (comment) (RN present and aware)  Home Living                                          Prior Functioning/Environment              Frequency  Min 1X/week        Progress Toward Goals  OT Goals(current goals can now be found in the care plan section)  Progress towards OT goals: Progressing toward goals  Acute Rehab OT Goals Patient Stated Goal: get better soon, have some food OT Goal Formulation: With patient Time For Goal Achievement: 01/10/23 Potential to Achieve Goals: Fair ADL Goals Pt Will Perform Grooming: with contact guard assist;standing Pt Will Perform Upper Body Dressing: with set-up;sitting Pt Will Perform Lower Body Dressing: with min assist;sit to/from stand Pt Will Transfer to Toilet: with min assist;ambulating;regular height toilet  Plan      Co-evaluation                 AM-PAC OT "6 Clicks" Daily Activity     Outcome Measure   Help from another person eating meals?: A Little Help from another person taking care of personal  grooming?: A Little Help from another person toileting, which includes using toliet, bedpan, or urinal?: A Lot Help from another person bathing (including washing, rinsing, drying)?: A Lot Help from another person to put on and taking off regular upper body clothing?: A Little Help from another person to put on and taking off regular lower body clothing?: A Lot 6 Click Score: 15    End of Session Equipment Utilized During Treatment: Rolling walker (2 wheels)  OT Visit Diagnosis: Unsteadiness on feet (R26.81);Muscle weakness (generalized) (M62.81);History of falling (Z91.81);Pain Pain - Right/Left: Right Pain - part of body: Leg;Hip   Activity Tolerance Patient tolerated treatment well   Patient Left in bed;with call bell/phone within reach;with bed alarm set   Nurse Communication Mobility status        Time: 9562-1308 OT Time Calculation (min): 24 min  Charges: OT General Charges $OT Visit: 1 Visit OT Treatments $Self Care/Home Management : 23-37 mins  Bradd Canary, OTR/L Acute Rehab Services Office: [phone]   [name] 12/31/2022, 1:14 PM

## 2022-12-31 NOTE — Interval H&P Note (Signed)
History and Physical Interval Note: Patient stable overnight. Hgb looks stable. She is here for EGD today. Off anticoagulation. I have discussed risks / benefits of the exam and anesthesia with her. Gunnar Fusi spoke with her son yesterday. They wish to proceed. Further recommendations pending the results. No changes to her status overnight. She denies complaints this AM.  12/31/2022 8:21 AM  [name]  has presented today for surgery, with the diagnosis of black stool, anemia.  The various methods of treatment have been discussed with the patient and family. After consideration of risks, benefits and other options for treatment, the patient has consented to  Procedure(s): ESOPHAGOGASTRODUODENOSCOPY (EGD) WITH PROPOFOL (N/A) as a surgical intervention.  The patient's history has been reviewed, patient examined, no change in status, stable for surgery.  I have reviewed the patient's chart and labs.  Questions were answered to the patient's satisfaction.     Viviann Spare P Ciara Kagan

## 2022-12-31 NOTE — Transfer of Care (Signed)
Immediate Anesthesia Transfer of Care Note  Patient: [name]  Procedure(s) Performed: ESOPHAGOGASTRODUODENOSCOPY (EGD) WITH PROPOFOL FOREIGN BODY REMOVAL  Patient Location: PACU and Endoscopy Unit  Anesthesia Type:General  Level of Consciousness: sedated  Airway & Oxygen Therapy: Patient connected to face mask oxygen  Post-op Assessment: Report given to RN  Post vital signs: Reviewed and stable  Last Vitals:  Vitals Value Taken Time  BP    Temp    Pulse 174 12/31/22 0934  Resp 16 12/31/22 0937  SpO2 100 % 12/31/22 0934  Vitals shown include unfiled device data.  Last Pain:  Vitals:   12/31/22 0801  TempSrc: Temporal  PainSc: 7       Patients Stated Pain Goal: 0 (12/30/22 0400)  Complications: No notable events documented.

## 2022-12-31 NOTE — Addendum Note (Signed)
Addendum  created 12/31/22 1048 by Hessie Diener, CRNA   Flowsheet accepted

## 2022-12-31 NOTE — Progress Notes (Signed)
Patient ID: [name], female   DOB: 1950-05-24, 72 y.o.   MRN: [id number] [facility] Progress Note  Day of Surgery  Subjective: CC- feels "unwell" after EGD this AM but denies pain  Objective: Vital signs in last 24 hours: Temp:  [97.3 F (36.3 C)-98.2 F (36.8 C)] 97.3 F (36.3 C) (10/22 0937) Pulse Rate:  [77-100] 92 (10/22 1010) Resp:  [11-19] 16 (10/22 1010) BP: (131-185)/(66-95) 184/95 (10/22 1010) SpO2:  [95 %-100 %] 95 % (10/22 1010) Weight:  [51.5 kg] 51.5 kg (10/22 0801) Last BM Date : 12/29/22  Intake/Output from previous day: 10/21 0701 - 10/22 0700 In: 1868.3 [I.V.:1868.3] Out: 1250 [Urine:1250] Intake/Output this shift: Total I/O In: 200 [I.V.:200] Out: 400 [Urine:400]  PE: Gen:  Alert, NAD HEENT: EOM's intact, pupils equal and round Card:  RRR Pulm:  CTAB, no W/R/R, rate and effort normal on RA Abd: Soft, NT/ND Ext:  no BUE/BLE edema, calves soft and nontender Skin: no rashes noted, warm and dry  Lab Results:  Recent Labs    12/29/22 1522 12/29/22 1647 12/30/22 1014 12/31/22 0517  WBC 8.6  --   --  10.0  HGB 6.1*   < > 10.0* 10.4*  HCT 18.9*   < > 30.2* 31.2*  PLT 309  --   --  440*   < > = values in this interval not displayed.   BMET Recent Labs    12/29/22 1522  NA 144  K 4.0  CL 112*  CO2 24  GLUCOSE 85  BUN 48*  CREATININE 0.88  CALCIUM 8.3*   PT/INR No results for input(s): "LABPROT", "INR" in the last 72 hours. CMP     Component Value Date/Time   NA 144 12/29/2022 1522   NA 144 10/14/2016 1030   K 4.0 12/29/2022 1522   CL 112 (H) 12/29/2022 1522   CO2 24 12/29/2022 1522   GLUCOSE 85 12/29/2022 1522   BUN 48 (H) 12/29/2022 1522   BUN 13 10/14/2016 1030   CREATININE 0.88 12/29/2022 1522   CALCIUM 8.3 (L) 12/29/2022 1522   PROT 6.5 12/22/2022 1245   PROT 6.6 10/14/2016 1030   ALBUMIN 3.3 (L) 12/22/2022 1245   ALBUMIN 4.4 10/14/2016 1030   AST 27 12/22/2022 1245   ALT 30 12/22/2022 1245    ALKPHOS 134 (H) 12/22/2022 1245   BILITOT 0.8 12/22/2022 1245   BILITOT 0.2 10/14/2016 1030   GFRNONAA >60 12/29/2022 1522   GFRAA >60 05/06/2019 0936   Lipase     Component Value Date/Time   LIPASE 60 (H) 12/01/2022 1450       Studies/Results: No results found.  Anti-infectives: Anti-infectives (From admission, onward)    Start     Dose/Rate Route Frequency Ordered Stop   12/28/22 1000  nitrofurantoin (macrocrystal-monohydrate) (MACROBID) capsule 100 mg        100 mg Oral Every 12 hours 12/28/22 0736 01/02/23 0959   12/28/22 0000  nitrofurantoin, macrocrystal-monohydrate, (MACROBID) 100 MG capsule        100 mg Oral Every 12 hours 12/28/22 0737          Assessment/Plan 87F with dementia and frequent falls   Hemorrhagic contusion of cerebrum - NSGY c/s (Dr. Maurice Small), keppra x7d, SLP for cog eval Acute on chronic T12 compression fracture - NSGY c/s, Dr. Maurice Small, nonop, activity as tolerated Anticoagulant use - Kcentra given at [facility] ED. Previous discharge summaries recommended stopping eliquis due to falls. Question per NSGY of cerebral amyloid angiopathy and  high risk if AC resumed. Minimum 1w off AC per NSGY.  R pubic rami fx - ortho c/s, Dr. Charlann Boxer, pain control, nonop, WBAT, PT/OT Chronic pain - followed by Dr. Mikael Spray. On subutex, tramadol, benzodiazepines. Home meds reordered 10/15 HTN COPD GERD Hx CVA Cognitive impairment Depression/ anxiety HTN - home meds reordered 10/15 Anemia - Hgb 6.1 on 10/20, given 2u PRBCs, post-transfusion h/h pending but vitals improved. Fecal occult blood positive. Unsure if anemia is 2/2 GIB or injuries. GI performed EGD today which was unremarkable. Per GI's note they recommend colonoscopy and if wnl then capsule endoscopy to try and localize source of bleeding. GI to discuss with patient   FEN - CLD VTE- SCDs. Lovenox restarted ID - UA with many bacteria and moderate leukocytes, start macrobid x 5 days 10/19   Dispo - 4E.  Therapies - rec SNF. Ongoing anemia work up as above.  I reviewed last 24 h vitals and pain scores, last 48 h intake and output, and last 24 h labs and trends.    LOS: 9 days    [facility] 12/31/2022, 12:38 PM Please see Amion for pager number during day hours 7:00am-4:30pm

## 2022-12-31 NOTE — Progress Notes (Signed)
SLP Cancellation Note  Patient Details Name: STARLETTE COHEA MRN: [id number] DOB: [birth year]   Cancelled treatment:       Reason Eval/Treat Not Completed: Patient at procedure or test/unavailable. Pt in EGD. Will continue efforts   Royce Macadamia 12/31/2022, 9:09 AM

## 2022-12-31 NOTE — Plan of Care (Signed)
  Problem: Education: Goal: Knowledge of General Education information will improve Description: Including pain rating scale, medication(s)/side effects and non-pharmacologic comfort measures Outcome: Progressing   Problem: Clinical Measurements: Goal: Respiratory complications will improve Outcome: Progressing   Problem: Elimination: Goal: Will not experience complications related to urinary retention Outcome: Progressing   Problem: Health Behavior/Discharge Planning: Goal: Ability to manage health-related needs will improve Outcome: Not Progressing   Problem: Activity: Goal: Risk for activity intolerance will decrease Outcome: Not Progressing   Problem: Nutrition: Goal: Adequate nutrition will be maintained Outcome: Not Progressing   Problem: Coping: Goal: Level of anxiety will decrease Outcome: Not Progressing   Problem: Elimination: Goal: Will not experience complications related to bowel motility Outcome: Not Progressing   Problem: Pain Managment: Goal: General experience of comfort will improve Outcome: Not Progressing

## 2023-01-01 DIAGNOSIS — K921 Melena: Secondary | ICD-10-CM | POA: Diagnosis not present

## 2023-01-01 DIAGNOSIS — D62 Acute posthemorrhagic anemia: Secondary | ICD-10-CM | POA: Diagnosis not present

## 2023-01-01 LAB — CBC
HCT: 31 % — ABNORMAL LOW (ref 36.0–46.0)
Hemoglobin: 10.3 g/dL — ABNORMAL LOW (ref 12.0–15.0)
MCH: 28.9 pg (ref 26.0–34.0)
MCHC: 33.2 g/dL (ref 30.0–36.0)
MCV: 86.8 fL (ref 80.0–100.0)
Platelets: 527 10*3/uL — ABNORMAL HIGH (ref 150–400)
RBC: 3.57 MIL/uL — ABNORMAL LOW (ref 3.87–5.11)
RDW: 17 % — ABNORMAL HIGH (ref 11.5–15.5)
WBC: 10.5 10*3/uL (ref 4.0–10.5)
nRBC: 0 % (ref 0.0–0.2)

## 2023-01-01 LAB — BASIC METABOLIC PANEL
Anion gap: 12 (ref 5–15)
BUN: 8 mg/dL (ref 8–23)
CO2: 23 mmol/L (ref 22–32)
Calcium: 8.7 mg/dL — ABNORMAL LOW (ref 8.9–10.3)
Chloride: 104 mmol/L (ref 98–111)
Creatinine, Ser: 0.62 mg/dL (ref 0.44–1.00)
GFR, Estimated: >60 mL/min (ref >60)
Glucose, Bld: 84 mg/dL (ref 70–99)
Potassium: 3 mmol/L — ABNORMAL LOW (ref 3.5–5.1)
Sodium: 139 mmol/L (ref 135–145)

## 2023-01-01 MED ORDER — METOCLOPRAMIDE HCL 5 MG/ML IJ SOLN
10.0000 mg | Freq: Once | INTRAMUSCULAR | Status: AC
  Administered 2023-01-01: 10 mg via INTRAVENOUS
  Filled 2023-01-01: qty 2

## 2023-01-01 MED ORDER — METOCLOPRAMIDE HCL 5 MG/ML IJ SOLN
10.0000 mg | Freq: Once | INTRAMUSCULAR | Status: AC
  Administered 2023-01-02: 10 mg via INTRAVENOUS
  Filled 2023-01-01: qty 2

## 2023-01-01 MED ORDER — PEG-KCL-NACL-NASULF-NA ASC-C 100 G PO SOLR
0.5000 | Freq: Once | ORAL | Status: AC
  Administered 2023-01-02: 100 g via ORAL
  Filled 2023-01-01: qty 1

## 2023-01-01 MED ORDER — PEG-KCL-NACL-NASULF-NA ASC-C 100 G PO SOLR
0.5000 | Freq: Once | ORAL | Status: AC
  Administered 2023-01-01: 100 g via ORAL
  Filled 2023-01-01: qty 1

## 2023-01-01 MED ORDER — PEG-KCL-NACL-NASULF-NA ASC-C 100 G PO SOLR: 0.5000 | Freq: Once | ORAL | Status: DC

## 2023-01-01 NOTE — Progress Notes (Signed)
Progress Note  1 Day Post-Op  Subjective: Pt reports loose BMs, denies abdominal pain. Pain overall well controlled.   Objective: Vital signs in last 24 hours: Temp:  [97.3 F (36.3 C)-98.1 F (36.7 C)] 98.1 F (36.7 C) (10/23 0720) Pulse Rate:  [65-94] 94 (10/23 0720) Resp:  [11-19] 18 (10/23 0720) BP: (131-184)/(66-95) 159/77 (10/23 0720) SpO2:  [92 %-100 %] 92 % (10/23 0720) Last BM Date : 01/01/23  Intake/Output from previous day: 10/22 0701 - 10/23 0700 In: 200 [I.V.:200] Out: 400 [Urine:400] Intake/Output this shift: No intake/output data recorded.  PE: Gen:  Alert, NAD HEENT: EOM's intact, pupils equal and round Card: RRR Pulm:  CTAB, no W/R/R, rate and effort normal on room air Abd: Soft, NT/ND, +BS Ext:  no BUE/BLE edema, calves soft and nontender Skin: no rashes noted, warm and dry   Lab Results:  Recent Labs    12/31/22 0517 01/01/23 0758  WBC 10.0 10.5  HGB 10.4* 10.3*  HCT 31.2* 31.0*  PLT 440* 527*   BMET Recent Labs    12/29/22 1522  NA 144  K 4.0  CL 112*  CO2 24  GLUCOSE 85  BUN 48*  CREATININE 0.88  CALCIUM 8.3*   PT/INR No results for input(s): "LABPROT", "INR" in the last 72 hours. CMP     Component Value Date/Time   NA 144 12/29/2022 1522   NA 144 10/14/2016 1030   K 4.0 12/29/2022 1522   CL 112 (H) 12/29/2022 1522   CO2 24 12/29/2022 1522   GLUCOSE 85 12/29/2022 1522   BUN 48 (H) 12/29/2022 1522   BUN 13 10/14/2016 1030   CREATININE 0.88 12/29/2022 1522   CALCIUM 8.3 (L) 12/29/2022 1522   PROT 6.5 12/22/2022 1245   PROT 6.6 10/14/2016 1030   ALBUMIN 3.3 (L) 12/22/2022 1245   ALBUMIN 4.4 10/14/2016 1030   AST 27 12/22/2022 1245   ALT 30 12/22/2022 1245   ALKPHOS 134 (H) 12/22/2022 1245   BILITOT 0.8 12/22/2022 1245   BILITOT 0.2 10/14/2016 1030   GFRNONAA >60 12/29/2022 1522   GFRAA >60 05/06/2019 0936   Lipase     Component Value Date/Time   LIPASE 60 (H) 12/01/2022 1450       Studies/Results: No  results found.  Anti-infectives: Anti-infectives (From admission, onward)    Start     Dose/Rate Route Frequency Ordered Stop   12/28/22 1000  nitrofurantoin (macrocrystal-monohydrate) (MACROBID) capsule 100 mg        100 mg Oral Every 12 hours 12/28/22 0736 01/02/23 0959   12/28/22 0000  nitrofurantoin, macrocrystal-monohydrate, (MACROBID) 100 MG capsule        100 mg Oral Every 12 hours 12/28/22 0737          Assessment/Plan  17F with dementia and frequent falls   Hemorrhagic contusion of cerebrum - NSGY c/s (Dr. Maurice Small), keppra x7d, SLP for cog eval Acute on chronic T12 compression fracture - NSGY c/s, Dr. Maurice Small, nonop, activity as tolerated Anticoagulant use - Kcentra given at [facility] ED. Previous discharge summaries recommended stopping eliquis due to falls. Question per NSGY of cerebral amyloid angiopathy and high risk if AC resumed. Minimum 1w off AC per NSGY.  R pubic rami fx - ortho c/s, Dr. Charlann Boxer, pain control, nonop, WBAT, PT/OT Chronic pain - followed by Dr. Mikael Spray. On subutex, tramadol, benzodiazepines. Home meds reordered 10/15 COPD GERD Hx CVA Cognitive impairment Depression/ anxiety HTN - home meds reordered 10/15 Anemia - s/p 2u PRBCs 10/20,  hgb stable at 10.3 this AM. Fecal occult blood positive. Unsure if anemia is 2/2 GIB or injuries. GI performed EGD 10/22 which was unremarkable. Planning colonoscopy this afternoon  UTI - 5 days PO abx, last day is tomorrow    FEN - NPO for colonoscopy  VTE- SCDs. Lovenox restarted ID - UA with many bacteria and moderate leukocytes, start macrobid x 5 days 10/19   Dispo - 4E. Therapies - rec SNF. Ongoing anemia work up as above.   LOS: 10 days   I reviewed Consultant GI notes, last 24 h vitals and pain scores, last 48 h intake and output, last 24 h labs and trends, and last 24 h imaging results.   Juliet Rude, [facility] Surgery 01/01/2023, 8:26 AM Please see Amion for pager number during day  hours 7:00am-4:30pm

## 2023-01-01 NOTE — Consult Note (Addendum)
[facility] Customer service manager [facility]) Accountable Care Organization (ACO) [facility] Liaison Note  01/01/2023  [name] 09-May-1950 [id number]  Covering Charlesetta Shanks, RN [facility] Liaison at [facility])  Location: [facility] Liaison screened the patient remotely at [facility]  Insurance: [employer]   [name] is a 72 y.o. female who is a Primary Care Patient of Frederich Chick., MD-[facility]. The patient was screened for 30 day readmission hospitalization with noted extreme risk score for unplanned readmission risk with 3 IP/1 ED in 6 months.  The patient was assessed for potential [facility]) Care Management service needs for post hospital transition for care coordination. Review of patient's electronic medical record reveals patient was admitted for Focal Hemorrhagic contusion of the cerebrum. Pt from ALF and facility will continue to address pt's needs upon her discharged to the facility.    [facility] Care Management/Population Health does not replace or interfere with any arrangements made by the Inpatient Transition of Care team.   For questions contact:   Elliot Cousin, RN, [facility] Liaison [place]   [facility], Population Health Office Hours MTWF  8:00 am-6:00 pm Direct Dial: [phone] mobile [phone] [Office toll free line] Office Hours are M-F 8:30 - 5 pm [email or web address]

## 2023-01-01 NOTE — Care Management Important Message (Signed)
Important Message  Patient Details  Name: GETRUDE CANSLER MRN: [id number] Date of Birth: [birth year]   Important Message Given:  Yes - Medicare IM     Sherilyn Banker 01/01/2023, 9:51 AM

## 2023-01-01 NOTE — Progress Notes (Signed)
SLP Cancellation Note  Patient Details Name: [name] MRN: [id number] DOB: Mar 31, 1950   Cancelled treatment:       Reason Eval/Treat Not Completed: Patient declined, no reason specified (requesting to prep for colonoscopy)  Clyde Canterbury, M.S., CCC-SLP Speech-Language Pathologist Secure Chat Preferred  O: [phone]  Woodroe Chen 01/01/2023, 10:27 AM

## 2023-01-01 NOTE — Progress Notes (Signed)
Progress Note   Subjective  Chief Complaint: Black stools and anemia  Enteroscopy 12/31/2022 with food in the upper third of the esophagus impression of the stomach, a medium amount of food residue in the stomach and proximal stomach, patient intubated, normal duodenum all the way to the proximal jejunum  Patient was supposed to have colonoscopy today, but was unable to finish prep.  In the room she has about 60% of this completed, but is still passing dark stools per nursing staff.  She tells me she was unable to drink anymore given some nausea.  She thinks she could complete the bowel prep over this evening and into tomorrow if she had something to help her with this.  Aware that we are moving her colonoscopy tomorrow.  No abdominal pain.   Objective   Vital signs in last 24 hours: Temp:  [97.6 F (36.4 C)-98.1 F (36.7 C)] 97.9 F (36.6 C) (10/23 1107) Pulse Rate:  [65-94] 90 (10/23 1107) Resp:  [11-18] 16 (10/23 1107) BP: (136-169)/(72-96) 161/96 (10/23 1107) SpO2:  [91 %-100 %] 91 % (10/23 1107) Last BM Date : 01/01/23 General:  elderly white female in NAD Heart:  Regular rate and rhythm; no murmurs Lungs: Respirations even and unlabored, lungs CTA bilaterally Abdomen:  Soft, nontender and nondistended. Normal bowel sounds. Psych:  Cooperative. Normal mood and affect.  Intake/Output from previous day: 10/22 0701 - 10/23 0700 In: 200 [I.V.:200] Out: 400 [Urine:400]  Lab Results: Recent Labs    12/29/22 1522 12/29/22 1647 12/30/22 1014 12/31/22 0517 01/01/23 0758  WBC 8.6  --   --  10.0 10.5  HGB 6.1*   < > 10.0* 10.4* 10.3*  HCT 18.9*   < > 30.2* 31.2* 31.0*  PLT 309  --   --  440* 527*   < > = values in this interval not displayed.   BMET Recent Labs    12/29/22 1522 01/01/23 0758  NA 144 139  K 4.0 3.0*  CL 112* 104  CO2 24 23  GLUCOSE 85 84  BUN 48* 8  CREATININE 0.88 0.62  CALCIUM 8.3* 8.7*    Assessment / Plan:   Assessment: 1.  Anemia:  Acute drop in hemoglobin 11--> 6.1 with black stool and hypotension, had been on Lovenox, hemoglobin improved to 10 after only 2 units, BUN elevated over the past few days, improved now, push enteroscopy 10/22 unrevealing for source, plan for colonoscopy today and possible pill capsule but patient unable to complete prep 2.  Falls/fractures: Hemorrhagic contusion of the cerebrum, acute on chronic T12 compression fracture and right pubic fracture following fall at home 3.  Chronic anticoagulation: On Eliquis-on hold 4.  History of Barrett's esophagus: Found on EGD in 2019, last EGD February 2022 5.  Chronic pancreatitis: CT showed scattered parenchymal calcifications and intraductal calcifications as well as some cystic lesions in the pancreas thought to represent sidebranch IPMN's 6.  Chronically elevated alk phos: Improved 7.  History of colon polyps: Patient overdue for surveillance colonoscopy.  Plan: 1.  We will move patient's colonoscopy to tomorrow 01/02/2023.  Discussed bowel prep with the patient.  She needs to finish what she has in the room, about 50% and complete another half of bowel prep early tomorrow morning.  Procedure is scheduled for 1030 tomorrow, patient needs to be n.p.o. by 630. 2.  Will add Reglan to be given this afternoon when she restarts a bowel prep at around 3:00 and again in the morning to help  her complete this. 3.  Patient will be on clears today and n.p.o. at midnight. 4.  Continue to monitor hemoglobin with transfusion as needed less than 7  Thank you for your kind consultation, we will continue to follow.    LOS: 10 days   Unk Lightning  01/01/2023, 11:34 AM

## 2023-01-01 NOTE — Progress Notes (Signed)
Physical Therapy Treatment Patient Details Name: [name] MRN: [id number] DOB: [birth year] Today's Date: 01/01/2023   History of Present Illness Pt is 72 yo female who presents on 12/22/22 after being found down at ALF and with AMS. Pt with head contusion and nondisplaced fxs of the R inferior and superior pubic rami, and R sacral wing. Progression of T12 compression fx and chronic T11, L1, L3 compression fxs. EGD 10/22, colonscopy scheduled for 10/23. PMH: COPD, CHF, GERD, HTN, CVA, fibromyalgia, sacral decubitus, meningitis, chronic pancreatitis. 2 recent admissions from falls, COPD exacerbation, and respiratory failure    PT Comments  Pt received in supine and eager for mobility. Pt requesting to use the [facility] at the beginning of the session and requires assist with pericare. Pt able to perform bed mobility and pivot with up to min A for stability. Pt able to tolerate short gait distance in the room, however is limited by need to use the [facility] again and weakness. Pt continues to benefit from PT services to progress toward functional mobility goals.    If plan is discharge home, recommend the following: Help with stairs or ramp for entrance;Assistance with cooking/housework;A lot of help with walking and/or transfers;A lot of help with bathing/dressing/bathroom;Direct supervision/assist for medications management;Direct supervision/assist for financial management;Assist for transportation;Supervision due to cognitive status   Can travel by private vehicle     No  Equipment Recommendations  None recommended by PT    Recommendations for Other Services       Precautions / Restrictions Precautions Precautions: Fall Restrictions Weight Bearing Restrictions: No     Mobility  Bed Mobility Overal bed mobility: Needs Assistance Bed Mobility: Supine to Sit, Sit to Supine     Supine to sit: Supervision, Used rails, HOB elevated Sit to supine: Min assist   General bed mobility  comments: Min A for BLE elevation to EOB    Transfers Overall transfer level: Needs assistance Equipment used: Rolling walker (2 wheels) Transfers: Sit to/from Stand, Bed to chair/wheelchair/BSC Sit to Stand: Contact guard assist, Min assist   Step pivot transfers: Contact guard assist       General transfer comment: initial min A to stand from EOB progressing to CGA from [facility]    Ambulation/Gait Ambulation/Gait assistance: Contact guard assist Gait Distance (Feet): 10 Feet Assistive device: Rolling walker (2 wheels) Gait Pattern/deviations: Decreased stride length, Decreased step length - left, Step-to pattern, Trunk flexed, Decreased stance time - right Gait velocity: decreased     General Gait Details: slow step-to pattern with decreased RLE WB tolerance due to pain. cues for upright posture.      Balance Overall balance assessment: Needs assistance Sitting-balance support: Feet supported, No upper extremity supported Sitting balance-Leahy Scale: Good Sitting balance - Comments: sitting EOB   Standing balance support: Reliant on assistive device for balance, Bilateral upper extremity supported, During functional activity Standing balance-Leahy Scale: Poor Standing balance comment: with RW support                            Cognition Arousal: Alert Behavior During Therapy: WFL for tasks assessed/performed Overall Cognitive Status: Impaired/Different from baseline                                          Exercises      General Comments  Pertinent Vitals/Pain Pain Assessment Pain Assessment: 0-10 Faces Pain Scale: Hurts even more Pain Location: RLE Pain Descriptors / Indicators: Throbbing, Sore Pain Intervention(s): Limited activity within patient's tolerance, Monitored during session     PT Goals (current goals can now be found in the care plan section) Acute Rehab PT Goals Patient Stated Goal: figure out why she keeps  falling PT Goal Formulation: With patient Time For Goal Achievement: 01/06/23 Progress towards PT goals: Progressing toward goals    Frequency    Min 1X/week       AM-PAC PT "6 Clicks" Mobility   Outcome Measure  Help needed turning from your back to your side while in a flat bed without using bedrails?: A Little Help needed moving from lying on your back to sitting on the side of a flat bed without using bedrails?: A Little Help needed moving to and from a bed to a chair (including a wheelchair)?: A Little Help needed standing up from a chair using your arms (e.g., wheelchair or bedside chair)?: A Little Help needed to walk in hospital room?: A Little Help needed climbing 3-5 steps with a railing? : A Lot 6 Click Score: 17    End of Session Equipment Utilized During Treatment: Gait belt Activity Tolerance: Patient tolerated treatment well;Patient limited by fatigue Patient left: with call bell/phone within reach;in bed;with nursing/sitter in room Nurse Communication: Mobility status PT Visit Diagnosis: Unsteadiness on feet (R26.81);Other abnormalities of gait and mobility (R26.89);Muscle weakness (generalized) (M62.81);Repeated falls (R29.6);Difficulty in walking, not elsewhere classified (R26.2);Pain Pain - Right/Left: Right Pain - part of body: Hip;Leg     Time: 1610-9604 PT Time Calculation (min) (ACUTE ONLY): 24 min  Charges:    $Gait Training: 8-22 mins $Therapeutic Activity: 8-22 mins PT General Charges $$ ACUTE PT VISIT: 1 Visit                     Johny Shock, PTA Acute Rehabilitation Services Secure Chat Preferred  [phone]    Johny Shock 01/01/2023, 4:06 PM

## 2023-01-01 NOTE — Progress Notes (Signed)
PT Cancellation Note  Patient Details Name: [name] MRN: [id number] DOB: 1950-05-26   Cancelled Treatment:    Reason Eval/Treat Not Completed: (P) Patient declined, no reason specified (Pt reports trying to prepare for colonoscopy and asks for therapy to return after procedure. Will return later as schedule allows.)   Johny Shock 01/01/2023, 10:24 AM

## 2023-01-01 NOTE — H&P (View-Only) (Signed)
Progress Note   Subjective  Chief Complaint: Black stools and anemia  Enteroscopy 12/31/2022 with food in the upper third of the esophagus impression of the stomach, a medium amount of food residue in the stomach and proximal stomach, patient intubated, normal duodenum all the way to the proximal jejunum  Patient was supposed to have colonoscopy today, but was unable to finish prep.  In the room she has about 60% of this completed, but is still passing dark stools per nursing staff.  She tells me she was unable to drink anymore given some nausea.  She thinks she could complete the bowel prep over this evening and into tomorrow if she had something to help her with this.  Aware that we are moving her colonoscopy tomorrow.  No abdominal pain.   Objective   Vital signs in last 24 hours: Temp:  [97.6 F (36.4 C)-98.1 F (36.7 C)] 97.9 F (36.6 C) (10/23 1107) Pulse Rate:  [65-94] 90 (10/23 1107) Resp:  [11-18] 16 (10/23 1107) BP: (136-169)/(72-96) 161/96 (10/23 1107) SpO2:  [91 %-100 %] 91 % (10/23 1107) Last BM Date : 01/01/23 General:  elderly white female in NAD Heart:  Regular rate and rhythm; no murmurs Lungs: Respirations even and unlabored, lungs CTA bilaterally Abdomen:  Soft, nontender and nondistended. Normal bowel sounds. Psych:  Cooperative. Normal mood and affect.  Intake/Output from previous day: 10/22 0701 - 10/23 0700 In: 200 [I.V.:200] Out: 400 [Urine:400]  Lab Results: Recent Labs    12/29/22 1522 12/29/22 1647 12/30/22 1014 12/31/22 0517 01/01/23 0758  WBC 8.6  --   --  10.0 10.5  HGB 6.1*   < > 10.0* 10.4* 10.3*  HCT 18.9*   < > 30.2* 31.2* 31.0*  PLT 309  --   --  440* 527*   < > = values in this interval not displayed.   BMET Recent Labs    12/29/22 1522 01/01/23 0758  NA 144 139  K 4.0 3.0*  CL 112* 104  CO2 24 23  GLUCOSE 85 84  BUN 48* 8  CREATININE 0.88 0.62  CALCIUM 8.3* 8.7*    Assessment / Plan:   Assessment: 1.  Anemia:  Acute drop in hemoglobin 11--> 6.1 with black stool and hypotension, had been on Lovenox, hemoglobin improved to 10 after only 2 units, BUN elevated over the past few days, improved now, push enteroscopy 10/22 unrevealing for source, plan for colonoscopy today and possible pill capsule but patient unable to complete prep 2.  Falls/fractures: Hemorrhagic contusion of the cerebrum, acute on chronic T12 compression fracture and right pubic fracture following fall at home 3.  Chronic anticoagulation: On Eliquis-on hold 4.  History of Barrett's esophagus: Found on EGD in 2019, last EGD February 2022 5.  Chronic pancreatitis: CT showed scattered parenchymal calcifications and intraductal calcifications as well as some cystic lesions in the pancreas thought to represent sidebranch IPMN's 6.  Chronically elevated alk phos: Improved 7.  History of colon polyps: Patient overdue for surveillance colonoscopy.  Plan: 1.  We will move patient's colonoscopy to tomorrow 01/02/2023.  Discussed bowel prep with the patient.  She needs to finish what she has in the room, about 50% and complete another half of bowel prep early tomorrow morning.  Procedure is scheduled for 1030 tomorrow, patient needs to be n.p.o. by 630. 2.  Will add Reglan to be given this afternoon when she restarts a bowel prep at around 3:00 and again in the morning to help  her complete this. 3.  Patient will be on clears today and n.p.o. at midnight. 4.  Continue to monitor hemoglobin with transfusion as needed less than 7  Thank you for your kind consultation, we will continue to follow.    LOS: 10 days   Unk Lightning  01/01/2023, 11:34 AM

## 2023-01-01 NOTE — Plan of Care (Signed)
  Problem: Clinical Measurements: Goal: Respiratory complications will improve Outcome: Progressing   Problem: Coping: Goal: Level of anxiety will decrease Outcome: Progressing   Problem: Safety: Goal: Ability to remain free from injury will improve Outcome: Progressing   Problem: Health Behavior/Discharge Planning: Goal: Ability to manage health-related needs will improve Outcome: Not Progressing   Problem: Activity: Goal: Risk for activity intolerance will decrease Outcome: Not Progressing   Problem: Elimination: Goal: Will not experience complications related to bowel motility Outcome: Not Progressing   Problem: Pain Managment: Goal: General experience of comfort will improve Outcome: Not Progressing

## 2023-01-02 ENCOUNTER — Inpatient Hospital Stay (HOSPITAL_COMMUNITY): Payer: 59 | Admitting: Certified Registered Nurse Anesthetist

## 2023-01-02 ENCOUNTER — Encounter (HOSPITAL_COMMUNITY)
Admission: EM | Disposition: A | Discharge status: Skilled Nursing Facility | Payer: Self-pay | Source: Skilled Nursing Facility

## 2023-01-02 ENCOUNTER — Encounter (HOSPITAL_COMMUNITY): Payer: Self-pay

## 2023-01-02 DIAGNOSIS — K644 Residual hemorrhoidal skin tags: Secondary | ICD-10-CM | POA: Diagnosis not present

## 2023-01-02 DIAGNOSIS — K922 Gastrointestinal hemorrhage, unspecified: Secondary | ICD-10-CM

## 2023-01-02 DIAGNOSIS — J449 Chronic obstructive pulmonary disease, unspecified: Secondary | ICD-10-CM

## 2023-01-02 HISTORY — PX: GIVENS CAPSULE STUDY: SHX5432

## 2023-01-02 HISTORY — PX: COLONOSCOPY WITH PROPOFOL: SHX5780

## 2023-01-02 SURGERY — COLONOSCOPY WITH PROPOFOL
Anesthesia: Monitor Anesthesia Care

## 2023-01-02 MED ORDER — PHENYLEPHRINE HCL (PRESSORS) 10 MG/ML IV SOLN
INTRAVENOUS | Status: DC | PRN
  Administered 2023-01-02 (×3): 80 ug via INTRAVENOUS

## 2023-01-02 MED ORDER — PROPOFOL 10 MG/ML IV BOLUS
INTRAVENOUS | Status: DC | PRN
  Administered 2023-01-02 (×3): 20 mg via INTRAVENOUS
  Administered 2023-01-02 (×2): 40 mg via INTRAVENOUS
  Administered 2023-01-02 (×4): 20 mg via INTRAVENOUS

## 2023-01-02 MED ORDER — LIDOCAINE 2% (20 MG/ML) 5 ML SYRINGE
INTRAMUSCULAR | Status: DC | PRN
  Administered 2023-01-02: 30 mg via INTRAVENOUS

## 2023-01-02 MED ORDER — SODIUM CHLORIDE 0.9 % IV SOLN: INTRAVENOUS | Status: DC | PRN

## 2023-01-02 SURGICAL SUPPLY — 22 items

## 2023-01-02 NOTE — Progress Notes (Signed)
Occupational Therapy Treatment Patient Details Name: [name] MRN: [id number] DOB: [birth year] Today's Date: 01/02/2023   History of present illness Pt is 72 yo female who presents on 12/22/22 after being found down at ALF and with AMS. Pt with head contusion and nondisplaced fxs of the R inferior and superior pubic rami, and R sacral wing. Progression of T12 compression fx and chronic T11, L1, L3 compression fxs. EGD 10/22, colonscopy scheduled for 10/23. PMH: COPD, CHF, GERD, HTN, CVA, fibromyalgia, sacral decubitus, meningitis, chronic pancreatitis. 2 recent admissions from falls, COPD exacerbation, and respiratory failure   OT comments  Pt making steady progress towards OT goals. Pt able to increase endurance for in-room mobility with RW and slow pace at CGA. Pt with continued assist for LB ADLs needed d/t decreased flexibility and continued R hip discomfort but improving. Pt declined to sit in chair and returned to bed at end of session.       If plan is discharge home, recommend the following:  Direct supervision/assist for financial management;Direct supervision/assist for medications management;A little help with walking and/or transfers;A little help with bathing/dressing/bathroom   Equipment Recommendations  None recommended by OT    Recommendations for Other Services      Precautions / Restrictions Precautions Precautions: Fall Restrictions Weight Bearing Restrictions: No       Mobility Bed Mobility Overal bed mobility: Needs Assistance Bed Mobility: Supine to Sit, Sit to Supine     Supine to sit: Min assist, HOB elevated, Used rails Sit to supine: Contact guard assist   General bed mobility comments: assist to lift trunk d/t bluetooth camera monitor under hip. CGA and able to get BLE back to bed    Transfers Overall transfer level: Needs assistance Equipment used: Rolling walker (2 wheels) Transfers: Sit to/from Stand Sit to Stand: Contact guard assist            General transfer comment: able to stand from bedside on second attempt without assist     Balance Overall balance assessment: Needs assistance Sitting-balance support: Feet supported, No upper extremity supported Sitting balance-Leahy Scale: Good     Standing balance support: Reliant on assistive device for balance, Bilateral upper extremity supported, During functional activity Standing balance-Leahy Scale: Poor                             ADL either performed or assessed with clinical judgement   ADL Overall ADL's : Needs assistance/impaired                     Lower Body Dressing: Moderate assistance;Sitting/lateral leans;Sit to/from stand Lower Body Dressing Details (indicate cue type and reason): able to cross LLE to reach socks, able to cross RLE slowly but unable to reach socks d/t R hip discomfort. encouraged flexibility  exercises to maximize LB ADL abilities             Functional mobility during ADLs: Contact guard assist;Rolling walker (2 wheels) General ADL Comments: able to mobilize to/from door x 2 today with CGA and slow pacing. Declined bathroom mobility or ADLs at sink    Extremity/Trunk Assessment Upper Extremity Assessment Upper Extremity Assessment: Generalized weakness;Right hand dominant   Lower Extremity Assessment Lower Extremity Assessment: Defer to PT evaluation        Vision   Vision Assessment?: No apparent visual deficits   Perception     Praxis      Cognition Arousal: Alert  Behavior During Therapy: WFL for tasks assessed/performed Overall Cognitive Status: Impaired/Different from baseline Area of Impairment: Safety/judgement, Awareness, Problem solving                         Safety/Judgement: Decreased awareness of safety Awareness: Emergent Problem Solving: Slow processing, Requires verbal cues General Comments: pleasant, follows directions consistently. showing some insight into  deficits but does need cues for safety and pacing        Exercises      Shoulder Instructions       General Comments      Pertinent Vitals/ Pain       Pain Assessment Pain Assessment: Faces Faces Pain Scale: Hurts a little bit Pain Location: RLE Pain Descriptors / Indicators: Sore Pain Intervention(s): Monitored during session  Home Living                                          Prior Functioning/Environment              Frequency  Min 1X/week        Progress Toward Goals  OT Goals(current goals can now be found in the care plan section)  Progress towards OT goals: Progressing toward goals  Acute Rehab OT Goals Patient Stated Goal: walk again later, increase RLE strength OT Goal Formulation: With patient Time For Goal Achievement: 01/10/23 Potential to Achieve Goals: Fair ADL Goals Pt Will Perform Grooming: with contact guard assist;standing Pt Will Perform Upper Body Dressing: with set-up;sitting Pt Will Perform Lower Body Dressing: with min assist;sit to/from stand Pt Will Transfer to Toilet: with min assist;ambulating;regular height toilet  Plan      Co-evaluation                 AM-PAC OT "6 Clicks" Daily Activity     Outcome Measure   Help from another person eating meals?: A Little Help from another person taking care of personal grooming?: A Little Help from another person toileting, which includes using toliet, bedpan, or urinal?: A Lot Help from another person bathing (including washing, rinsing, drying)?: A Lot Help from another person to put on and taking off regular upper body clothing?: A Little Help from another person to put on and taking off regular lower body clothing?: A Lot 6 Click Score: 15    End of Session Equipment Utilized During Treatment: Rolling walker (2 wheels)  OT Visit Diagnosis: Unsteadiness on feet (R26.81);Muscle weakness (generalized) (M62.81);History of falling (Z91.81);Pain Pain -  Right/Left: Right Pain - part of body: Leg;Hip   Activity Tolerance Patient tolerated treatment well   Patient Left in bed;with call bell/phone within reach;with bed alarm set   Nurse Communication Mobility status        Time: 6295-2841 OT Time Calculation (min): 24 min  Charges: OT General Charges $OT Visit: 1 Visit OT Treatments $Self Care/Home Management : 8-22 mins $Therapeutic Activity: 8-22 mins  [name], OTR/L Acute Rehab Services Office: [phone]   Lorre Munroe 01/02/2023, 11:37 AM

## 2023-01-02 NOTE — Op Note (Signed)
[facility] Patient Name: [name] Procedure Date : 01/02/2023 MRN: [id number] Attending MD: Willaim Rayas. Adela Lank , MD, [id number] Date of Birth: February 02, 1951 CSN: [id number] Age: 72 Admit Type: Inpatient Procedure:                Colonoscopy Indications:              Gastrointestinal bleeding - dark stools, rise in                            BUN - history of anticoagulation. EGD without clear                            cause. Overdue for colonoscopy, history of polyps -                            colonoscopy to clear lower tract. 2 day prep Providers:                Marissa Spare P. Adela Lank, MD, Fransisca Connors,                            Kandice Robinsons, Technician Referring MD:              Medicines:                Monitored Anesthesia Care Complications:            No immediate complications. Estimated blood loss:                            None. Estimated Blood Loss:     Estimated blood loss: none. Procedure:                Pre-Anesthesia Assessment:                           - Prior to the procedure, a History and Physical                            was performed, and patient medications and                            allergies were reviewed. The patient's tolerance of                            previous anesthesia was also reviewed. The risks                            and benefits of the procedure and the sedation                            options and risks were discussed with the patient.                            All questions were answered, and informed consent  was obtained. Prior Anticoagulants: The patient has                            taken no anticoagulant or antiplatelet agents. ASA                            Grade Assessment: III - A patient with severe                            systemic disease. After reviewing the risks and                            benefits, the patient was deemed in satisfactory                             condition to undergo the procedure.                           After obtaining informed consent, the colonoscope                            was passed under direct vision. Throughout the                            procedure, the patient's blood pressure, pulse, and                            oxygen saturations were monitored continuously. The                            PCF-190TL ([id number]) Olympus colonoscope was                            introduced through the anus and advanced to the the                            cecum, identified by appendiceal orifice and                            ileocecal valve. The colonoscopy was performed                            without difficulty. The patient tolerated the                            procedure well. The quality of the bowel                            preparation was fair. The ileocecal valve,                            appendiceal orifice, and rectum were photographed. Scope In: 8:57:25 AM Scope Out: 9:14:34 AM Scope Withdrawal Time: 0 hours 11 minutes 16 seconds  Total Procedure Duration: 0 hours  17 minutes 9 seconds  Findings:      Skin tags were found on perianal exam.      A large amount of semi-liquid stool was found in the entire colon,       making visualization difficult. Lavage of the colon was performed using       copious amounts of sterile water, resulting in clearance with mostly       adequate visualization. There were some areas of retained stool which       was hard to completely clear. No large polyps or mass lesions noted, but       smaller or flat polyps may not have been appreciated given limitations       of the bowel prep.      Multiple small-mouthed diverticula were found in the transverse colon       and left colon.      The exam was otherwise without abnormality. Brown stool throughout - no       blood or heme noted anywhere. Impression:               - Preparation of the colon was fair leading to                             extensive lavage.                           - Perianal skin tags found on perianal exam.                           - Diverticulosis in the transverse colon and in the                            left colon.                           - The examination was otherwise normal of what                            could be visualized. No heme noted anywhere                           No cause for anemia / bleeding on this exam, will                            discuss with the patient and family if she wants to                            pursue capsule endoscopy. Recommendation:           - Return patient to hospital ward for ongoing care.                           - Consideration for capsule endoscopy to clear                            small bowel, especially if she needs to resume  anticoagulation and she is currently prepped.                           - Continue present medications.                           - Post-capsule endoscopy diet if she wishes to have                            that exam done today                           - Trend Hgb and monitor for further bleeding                           - Call with questions. Procedure Code(s):        --- Professional ---                           [phone], Colonoscopy, flexible; diagnostic, including                            collection of specimen(s) by brushing or washing,                            when performed (separate procedure) Diagnosis Code(s):        --- Professional ---                           K64.4, Residual hemorrhoidal skin tags                           K92.2, Gastrointestinal hemorrhage, unspecified                           K57.30, Diverticulosis of large intestine without                            perforation or abscess without bleeding CPT copyright 2022 American Medical Association. All rights reserved. The codes documented in this report are preliminary and upon coder review may   be revised to meet current compliance requirements. Marissa Spare P. Kentavius Dettore, MD 01/02/2023 9:31:03 AM This report has been signed electronically. Number of Addenda: 0

## 2023-01-02 NOTE — Progress Notes (Addendum)
Mobility Specialist Progress Note:   01/02/23 1407  Mobility  Activity Transferred to/from [facility];Ambulated with assistance in room  Level of Assistance Contact guard assist, steadying assist  Assistive Device Front wheel walker  Distance Ambulated (ft) 30 ft  RLE Weight Bearing WBAT  Activity Response Tolerated well  Mobility Referral Yes  $Mobility charge 1 Mobility  Mobility Specialist Start Time (ACUTE ONLY) 1350  Mobility Specialist Stop Time (ACUTE ONLY) 1405  Mobility Specialist Time Calculation (min) (ACUTE ONLY) 15 min   Pre Mobility: 146 HR  During Mobility: 140 HR Post Mobility: 117 HR   Pt found standing in room with bed alarm going off trying to ambulate to BR. Pt able to get OOB with both bed rails up. Pt reminded not to get out of bed without assistance. CG to pivot to [facility]. Void successful. Pt agreeable to ambulate in room. C/o RLE pain, otherwise asymptomatic throughout. Pt left in bed wit call bell in reach and all needs met. Bed alarm on. RN notified.  Leory Plowman  Mobility Specialist Please contact via [employer] office at [phone]

## 2023-01-02 NOTE — Anesthesia Preprocedure Evaluation (Addendum)
Anesthesia Evaluation  Patient identified by MRN, date of birth, ID band Patient awake    Reviewed: Allergy & Precautions, NPO status , Patient's Chart, lab work & pertinent test results  History of Anesthesia Complications (+) PONV and history of anesthetic complications  Airway Mallampati: II  TM Distance: >3 FB     Dental  (+) Dental Advisory Given   Pulmonary shortness of breath and with exertion, asthma , pneumonia, resolved, COPD,  COPD inhaler, Current Smoker and Patient abstained from smoking.   Pulmonary exam normal breath sounds clear to auscultation       Cardiovascular hypertension, Pt. on medications +CHF and + DOE   Rhythm:Regular Rate:Tachycardia  EKG 10/18 ST  Echo 04/16/2016 Left ventricle: The cavity size was normal. Wall thickness was    increased in a pattern of mild LVH. Systolic function was normal.    The estimated ejection fraction was in the range of 60% to 65%.    Wall motion was normal; there were no regional wall motion    abnormalities.  - Aortic valve: No evidence of vegetation. There was no stenosis.  - Aorta: Normal caliber aorta with minimal plaque.  - Mitral valve: No evidence of vegetation. There was mild    regurgitation.  - Left atrium: No evidence of thrombus in the atrial cavity or    appendage.  - Right atrium: No evidence of thrombus in the atrial cavity or    appendage.  - Atrial septum: No defect or patent foramen ovale was identified.  - Tricuspid valve: No evidence of vegetation.  - Pulmonic valve: No evidence of vegetation.     Neuro/Psych Seizures -, Well Controlled,  PSYCHIATRIC DISORDERS Anxiety Depression    Cerebral contusion  Neuromuscular disease CVA, Residual Symptoms    GI/Hepatic Neg liver ROS,GERD  Medicated,,Dark stools Hx/o colon polyps Hx/o Barrett's esophagus   Endo/Other  Hyperlipidemia  Renal/GU Renal InsufficiencyRenal disease  negative  genitourinary   Musculoskeletal  (+) Arthritis , Osteoarthritis,  Fibromyalgia -Pubic rami Fx Compression Fx T12   Abdominal   Peds  Hematology  (+) Blood dyscrasia, anemia Eliquis therapy- on hold   Anesthesia Other Findings   Reproductive/Obstetrics                             Anesthesia Physical Anesthesia Plan  ASA: 3  Anesthesia Plan: MAC   Post-op Pain Management: Minimal or no pain anticipated   Induction: Intravenous  PONV Risk Score and Plan: 2 and Treatment may vary due to age or medical condition and Propofol infusion  Airway Management Planned: Natural Airway and Simple Face Mask  Additional Equipment: None  Intra-op Plan:   Post-operative Plan:   Informed Consent: I have reviewed the patients History and Physical, chart, labs and discussed the procedure including the risks, benefits and alternatives for the proposed anesthesia with the patient or authorized representative who has indicated his/her understanding and acceptance.   Patient has DNR.  Discussed DNR with patient and Suspend DNR.     Plan Discussed with: Anesthesiologist and CRNA  Anesthesia Plan Comments:         Anesthesia Quick Evaluation

## 2023-01-02 NOTE — Progress Notes (Signed)
Progress Note  Day of Surgery  Subjective: Underwent colonoscopy this AM. Hoping to be able to eat dinner.   Objective: Vital signs in last 24 hours: Temp:  [97.7 F (36.5 C)-99.5 F (37.5 C)] 97.7 F (36.5 C) (10/24 1030) Pulse Rate:  [80-131] 83 (10/24 1100) Resp:  [12-28] 18 (10/24 1100) BP: (56-178)/(35-98) 133/71 (10/24 1100) SpO2:  [90 %-99 %] 93 % (10/24 1100) Weight:  [51.5 kg] 51.5 kg (10/24 0950) Last BM Date : 01/01/23  Intake/Output from previous day: 10/23 0701 - 10/24 0700 In: -  Out: 1150 [Urine:1150] Intake/Output this shift: Total I/O In: 100 [I.V.:100] Out: -   PE: Gen:  Alert, NAD HEENT: EOM's intact, pupils equal and round Card: RRR Pulm:  CTAB, no W/R/R, rate and effort normal on room air Abd: Soft, NT/ND, +BS Ext:  no BUE/BLE edema, calves soft and nontender Skin: no rashes noted, warm and dry   Lab Results:  Recent Labs    12/31/22 0517 01/01/23 0758  WBC 10.0 10.5  HGB 10.4* 10.3*  HCT 31.2* 31.0*  PLT 440* 527*   BMET Recent Labs    01/01/23 0758  NA 139  K 3.0*  CL 104  CO2 23  GLUCOSE 84  BUN 8  CREATININE 0.62  CALCIUM 8.7*   PT/INR No results for input(s): "LABPROT", "INR" in the last 72 hours. CMP     Component Value Date/Time   NA 139 01/01/2023 0758   NA 144 10/14/2016 1030   K 3.0 (L) 01/01/2023 0758   CL 104 01/01/2023 0758   CO2 23 01/01/2023 0758   GLUCOSE 84 01/01/2023 0758   BUN 8 01/01/2023 0758   BUN 13 10/14/2016 1030   CREATININE 0.62 01/01/2023 0758   CALCIUM 8.7 (L) 01/01/2023 0758   PROT 6.5 12/22/2022 1245   PROT 6.6 10/14/2016 1030   ALBUMIN 3.3 (L) 12/22/2022 1245   ALBUMIN 4.4 10/14/2016 1030   AST 27 12/22/2022 1245   ALT 30 12/22/2022 1245   ALKPHOS 134 (H) 12/22/2022 1245   BILITOT 0.8 12/22/2022 1245   BILITOT 0.2 10/14/2016 1030   GFRNONAA >60 01/01/2023 0758   GFRAA >60 05/06/2019 0936   Lipase     Component Value Date/Time   LIPASE 60 (H) 12/01/2022 1450        Studies/Results: No results found.  Anti-infectives: Anti-infectives (From admission, onward)    Start     Dose/Rate Route Frequency Ordered Stop   12/28/22 1000  nitrofurantoin (macrocrystal-monohydrate) (MACROBID) capsule 100 mg        100 mg Oral Every 12 hours 12/28/22 0736 01/02/23 0959   12/28/22 0000  nitrofurantoin, macrocrystal-monohydrate, (MACROBID) 100 MG capsule        100 mg Oral Every 12 hours 12/28/22 0737          Assessment/Plan  21F with dementia and frequent falls   Hemorrhagic contusion of cerebrum - NSGY c/s (Dr. Maurice Small), keppra x7d, SLP for cog eval Acute on chronic T12 compression fracture - NSGY c/s, Dr. Maurice Small, nonop, activity as tolerated Anticoagulant use - Kcentra given at [facility] ED. Previous discharge summaries recommended stopping eliquis due to falls. Question per NSGY of cerebral amyloid angiopathy and high risk if AC resumed. Minimum 1w off AC per NSGY.  R pubic rami fx - ortho c/s, Dr. Charlann Boxer, pain control, nonop, WBAT, PT/OT Chronic pain - followed by Dr. Mikael Spray. On subutex, tramadol, benzodiazepines. Home meds reordered 10/15 COPD GERD Hx CVA Cognitive impairment Depression/ anxiety HTN -  home meds reordered 10/15 Anemia - s/p 2u PRBCs 10/20, hgb stable at 10.3 yesterday. Fecal occult blood positive. Unsure if anemia is 2/2 GIB or injuries. GI performed EGD 10/22 which was unremarkable. Colonoscopy today without identified source of bleeding  UTI - 5 days PO abx completed   FEN - NPO  VTE- SCDs. Lovenox ID - PO macrobid x 5 days completed   Dispo - 4E. Therapies - rec SNF. Ongoing anemia work up as above.   LOS: 11 days   I reviewed Consultant GI notes, last 24 h vitals and pain scores, last 48 h intake and output, last 24 h labs and trends, and last 24 h imaging results.   Juliet Rude, [facility] 01/02/2023, 1:11 PM Please see Amion for pager number during day hours 7:00am-4:30pm

## 2023-01-02 NOTE — TOC Progression Note (Addendum)
Transition of Care [facility]) - Progression Note    Patient Details  Name: [name] MRN: [id number] Date of Birth: 1951/01/09  [facility] of [facility]) CM/SW Contact  Glennon Mac, RN Phone Number: 01/02/2023, 3:21 PM  Clinical Narrative:    Patient S/P colonoscopy today, and is medically stable for discharge to skilled nursing facility.  Will resubmit for insurance authorization for SNF placement.  [facility] admissions liaison updated. Vesta Mixer ID [id number]     Expected Discharge Plan: Skilled Nursing Facility Barriers to Discharge: Continued Medical Work up  Expected Discharge Plan and Services     Post Acute Care Choice: Skilled Nursing Facility                                         Social Determinants of Health (SDOH) Interventions SDOH Screenings   Food Insecurity: No Food Insecurity (12/09/2022)  Housing: Low Risk  (12/09/2022)  Transportation Needs: No Transportation Needs (12/09/2022)  Utilities: Not At Risk (12/09/2022)  Social Connections: Unknown (07/21/2021)   Received from [facility], [facility]  Stress: Stress Concern Present (05/10/2020)   Received from [facility], [facility]  Tobacco Use: High Risk (01/02/2023)    Readmission Risk Interventions    12/03/2022   10:10 AM 03/01/2022   11:53 AM  Readmission Risk Prevention Plan  Transportation Screening Complete Complete  Home Care Screening Complete Complete  Medication Review (RN CM)  Complete   Quintella Baton, RN, BSN  Trauma/Neuro ICU Case Manager [phone]

## 2023-01-02 NOTE — Transfer of Care (Signed)
Immediate Anesthesia Transfer of Care Note  Patient: Karel Jarvis Vigorito  Procedure(s) Performed: COLONOSCOPY WITH PROPOFOL  Patient Location: PACU  Anesthesia Type:MAC  Level of Consciousness: awake, alert , and oriented  Airway & Oxygen Therapy: Patient Spontanous Breathing and Patient connected to nasal cannula oxygen  Post-op Assessment: Report given to RN and Post -op Vital signs reviewed and stable  Post vital signs: Reviewed and stable  Last Vitals:  Vitals Value Taken Time  BP 85/55 01/02/23 0925  Temp 97.2   Pulse 89 01/02/23 0926  Resp 18 01/02/23 0926  SpO2 99 % 01/02/23 0926  Vitals shown include unfiled device data.  Last Pain:  Vitals:   01/02/23 0745  TempSrc: Temporal  PainSc: 0-No pain      Patients Stated Pain Goal: 0 (12/30/22 0400)  Complications: No notable events documented.

## 2023-01-02 NOTE — Anesthesia Postprocedure Evaluation (Signed)
Anesthesia Post Note  Patient: [name]  Procedure(s) Performed: COLONOSCOPY WITH PROPOFOL     Patient location during evaluation: PACU Anesthesia Type: MAC Level of consciousness: awake and alert and oriented Pain management: pain level controlled Vital Signs Assessment: post-procedure vital signs reviewed and stable Respiratory status: spontaneous breathing, nonlabored ventilation and respiratory function stable Cardiovascular status: stable and blood pressure returned to baseline Postop Assessment: no apparent nausea or vomiting Anesthetic complications: no   No notable events documented.  Last Vitals:  Vitals:   01/02/23 0927 01/02/23 0930  BP: (!) 104/45 (!) 105/59  Pulse: 89 89  Resp: 18 15  Temp:    SpO2: 99% 99%    Last Pain:  Vitals:   01/02/23 0930  TempSrc:   PainSc: 0-No pain                 [name]

## 2023-01-02 NOTE — Interval H&P Note (Signed)
History and Physical Interval Note: Patient drank another 1.5 preps yesterday which was in addition to 60% prep the night prior. Has liquid stools but not completely clear. Hopefully we can clear her colon with the prep as it is today. I have discussed risks / benefits and she wants to proceed, further recommendations pending the results. She is otherwise stable. No bleeding reported. Hgb stable post transfusion.  01/02/2023 8:03 AM  [name]  has presented today for surgery, with the diagnosis of anemia, dark stools, negative egd.  The various methods of treatment have been discussed with the patient and family. After consideration of risks, benefits and other options for treatment, the patient has consented to  Procedure(s): COLONOSCOPY WITH PROPOFOL (N/A) as a surgical intervention.  The patient's history has been reviewed, patient examined, no change in status, stable for surgery.  I have reviewed the patient's chart and labs.  Questions were answered to the patient's satisfaction.     Viviann Spare P Phyllis Abelson

## 2023-01-02 NOTE — Progress Notes (Signed)
OT Cancellation Note  Patient Details Name: [name] MRN: [id number] DOB: 1950-05-30   Cancelled Treatment:    Reason Eval/Treat Not Completed: Patient at procedure or test/ unavailable Off unit. Will follow up for OT session as schedule permits.  Lorre Munroe 01/02/2023, 8:42 AM

## 2023-01-02 NOTE — Progress Notes (Signed)
  Progress Note   Date: 12/31/2022  Patient Name: [name]        MRN#: [id number]  Clarification of diagnosis:  Acute blood loss anemia

## 2023-01-03 DIAGNOSIS — D62 Acute posthemorrhagic anemia: Secondary | ICD-10-CM | POA: Diagnosis not present

## 2023-01-03 DIAGNOSIS — K921 Melena: Secondary | ICD-10-CM | POA: Diagnosis not present

## 2023-01-03 LAB — CBC
HCT: 30.2 % — ABNORMAL LOW (ref 36.0–46.0)
Hemoglobin: 9.9 g/dL — ABNORMAL LOW (ref 12.0–15.0)
MCH: 28.9 pg (ref 26.0–34.0)
MCHC: 32.8 g/dL (ref 30.0–36.0)
MCV: 88 fL (ref 80.0–100.0)
Platelets: 596 10*3/uL — ABNORMAL HIGH (ref 150–400)
RBC: 3.43 MIL/uL — ABNORMAL LOW (ref 3.87–5.11)
RDW: 17.5 % — ABNORMAL HIGH (ref 11.5–15.5)
WBC: 10.2 10*3/uL (ref 4.0–10.5)
nRBC: 0 % (ref 0.0–0.2)

## 2023-01-03 NOTE — Progress Notes (Signed)
Progress Note  1 Day Post-Op  Subjective: Has not passed capsule yet. Reports some leg pain. Eating breakfast this AM and no other concerns.   Objective: Vital signs in last 24 hours: Temp:  [97.7 F (36.5 C)-98.5 F (36.9 C)] 97.9 F (36.6 C) (10/25 0835) Pulse Rate:  [76-111] 85 (10/25 0600) Resp:  [12-28] 16 (10/25 0600) BP: (56-163)/(35-100) 142/75 (10/25 0835) SpO2:  [90 %-99 %] 94 % (10/25 0738) Weight:  [51.5 kg] 51.5 kg (10/24 0950) Last BM Date : 01/01/23  Intake/Output from previous day: 10/24 0701 - 10/25 0700 In: 340 [P.O.:240; I.V.:100] Out: 350 [Urine:350] Intake/Output this shift: No intake/output data recorded.  PE: Gen:  Alert, NAD HEENT: EOM's intact, pupils equal and round Card: RRR Pulm:  CTAB, no W/R/R, rate and effort normal on room air Abd: Soft, NT/ND, +BS Ext:  no BUE/BLE edema, calves soft and nontender Skin: no rashes noted, warm and dry   Lab Results:  Recent Labs    01/01/23 0758  WBC 10.5  HGB 10.3*  HCT 31.0*  PLT 527*   BMET Recent Labs    01/01/23 0758  NA 139  K 3.0*  CL 104  CO2 23  GLUCOSE 84  BUN 8  CREATININE 0.62  CALCIUM 8.7*   PT/INR No results for input(s): "LABPROT", "INR" in the last 72 hours. CMP     Component Value Date/Time   NA 139 01/01/2023 0758   NA 144 10/14/2016 1030   K 3.0 (L) 01/01/2023 0758   CL 104 01/01/2023 0758   CO2 23 01/01/2023 0758   GLUCOSE 84 01/01/2023 0758   BUN 8 01/01/2023 0758   BUN 13 10/14/2016 1030   CREATININE 0.62 01/01/2023 0758   CALCIUM 8.7 (L) 01/01/2023 0758   PROT 6.5 12/22/2022 1245   PROT 6.6 10/14/2016 1030   ALBUMIN 3.3 (L) 12/22/2022 1245   ALBUMIN 4.4 10/14/2016 1030   AST 27 12/22/2022 1245   ALT 30 12/22/2022 1245   ALKPHOS 134 (H) 12/22/2022 1245   BILITOT 0.8 12/22/2022 1245   BILITOT 0.2 10/14/2016 1030   GFRNONAA >60 01/01/2023 0758   GFRAA >60 05/06/2019 0936   Lipase     Component Value Date/Time   LIPASE 60 (H) 12/01/2022 1450        Studies/Results: No results found.  Anti-infectives: Anti-infectives (From admission, onward)    Start     Dose/Rate Route Frequency Ordered Stop   12/28/22 1000  nitrofurantoin (macrocrystal-monohydrate) (MACROBID) capsule 100 mg        100 mg Oral Every 12 hours 12/28/22 0736 01/02/23 0959   12/28/22 0000  nitrofurantoin, macrocrystal-monohydrate, (MACROBID) 100 MG capsule        100 mg Oral Every 12 hours 12/28/22 0737          Assessment/Plan  77F with dementia and frequent falls   Hemorrhagic contusion of cerebrum - NSGY c/s (Dr. Maurice Small), keppra x7d, SLP for cog eval Acute on chronic T12 compression fracture - NSGY c/s, Dr. Maurice Small, nonop, activity as tolerated Anticoagulant use - Kcentra given at [facility] ED. Previous discharge summaries recommended stopping eliquis due to falls. Question per NSGY of cerebral amyloid angiopathy and high risk if AC resumed. Minimum 1w off AC per NSGY.  R pubic rami fx - ortho c/s, Dr. Charlann Boxer, pain control, nonop, WBAT, PT/OT Chronic pain - followed by Dr. Mikael Spray. On subutex, tramadol, benzodiazepines. Home meds reordered 10/15 COPD GERD Hx CVA Cognitive impairment Depression/ anxiety HTN - home meds  reordered 10/15 ABL Anemia - s/p 2u PRBCs 10/20, hgb stable at 10.3 10/23. Fecal occult blood positive. Unsure if anemia is 2/2 GIB or injuries. GI performed EGD 10/22 which was unremarkable. Colonoscopy 10/24 without identified source of bleeding. Capsule endoscopy in progress, repeat CBC today  UTI - 5 days PO abx completed   FEN - reg diet  VTE- SCDs. Lovenox ID - PO macrobid x 5 days completed   Dispo - 4E. Therapies - rec SNF. Ongoing anemia work up as above.   LOS: 12 days   I reviewed Consultant GI notes, last 24 h vitals and pain scores, last 48 h intake and output, last 24 h labs and trends, and last 24 h imaging results.   Juliet Rude, [facility] 01/03/2023, 9:08 AM Please see Amion for  pager number during day hours 7:00am-4:30pm

## 2023-01-03 NOTE — TOC Progression Note (Signed)
Transition of Care [facility]) - Progression Note    Patient Details  Name: [name] MRN: [id number] Date of Birth: 25-Feb-1951  Transition of Care [facility]) CM/SW Contact  Eduard Roux, [place] Phone Number: 01/03/2023, 12:23 PM  Clinical Narrative:     Insurance authorization approved 10/25-10/29 [id number]   SNF/ [facility], updated on possible d/c tomorrow .  TOC continues to follow and assist with discharge summary.  Antony Blackbird, MSW, LCSW Clinical Social Worker    Expected Discharge Plan: Skilled Nursing Facility Barriers to Discharge: Continued Medical Work up  Expected Discharge Plan and Services     Post Acute Care Choice: Skilled Nursing Facility                                         Social Determinants of Health (SDOH) Interventions SDOH Screenings   Food Insecurity: No Food Insecurity (12/09/2022)  Housing: Low Risk  (12/09/2022)  Transportation Needs: No Transportation Needs (12/09/2022)  Utilities: Not At Risk (12/09/2022)  Social Connections: Unknown (07/21/2021)   Received from [facility], [facility]  Stress: Stress Concern Present (05/10/2020)   Received from [facility], [facility]  Tobacco Use: High Risk (01/02/2023)    Readmission Risk Interventions    12/03/2022   10:10 AM 03/01/2022   11:53 AM  Readmission Risk Prevention Plan  Transportation Screening Complete Complete  Home Care Screening Complete Complete  Medication Review (RN CM)  Complete

## 2023-01-03 NOTE — Progress Notes (Signed)
Physical Therapy Treatment Patient Details Name: [name] MRN: [id number] DOB: 10-18-1950 Today's Date: 01/03/2023   History of Present Illness Pt is 72 yo female who presents on 12/22/22 after being found down at ALF and with AMS. Pt with head contusion and nondisplaced fxs of the R inferior and superior pubic rami, and R sacral wing. Progression of T12 compression fx and chronic T11, L1, L3 compression fxs. EGD 10/22, colonscopy scheduled for 10/23. PMH: COPD, CHF, GERD, HTN, CVA, fibromyalgia, sacral decubitus, meningitis, chronic pancreatitis. 2 recent admissions from falls, COPD exacerbation, and respiratory failure    PT Comments  Pt received in supine and agreeable to session. Pt able to perform all mobility with up to CGA and cues for safety. Pt requires increased time for initiation and response to cues. Pt continues to be limited by RLE pain and fatigue. Pt continues to benefit from PT services to progress toward functional mobility goals.    If plan is discharge home, recommend the following: Help with stairs or ramp for entrance;Assistance with cooking/housework;A lot of help with walking and/or transfers;A lot of help with bathing/dressing/bathroom;Direct supervision/assist for medications management;Direct supervision/assist for financial management;Assist for transportation;Supervision due to cognitive status   Can travel by private vehicle     No  Equipment Recommendations  None recommended by PT    Recommendations for Other Services       Precautions / Restrictions Precautions Precautions: Fall Restrictions Weight Bearing Restrictions: No RLE Weight Bearing: Weight bearing as tolerated     Mobility  Bed Mobility Overal bed mobility: Needs Assistance Bed Mobility: Supine to Sit     Supine to sit: Contact guard, HOB elevated     General bed mobility comments: increased time    Transfers Overall transfer level: Needs assistance Equipment used: Rolling  walker (2 wheels) Transfers: Sit to/from Stand Sit to Stand: Contact guard assist           General transfer comment: increased time for power up, but no assist needed    Ambulation/Gait Ambulation/Gait assistance: Contact guard assist Gait Distance (Feet): 40 Feet Assistive device: Rolling walker (2 wheels) Gait Pattern/deviations: Decreased stride length, Decreased step length - left, Step-to pattern, Trunk flexed, Decreased stance time - right Gait velocity: decreased     General Gait Details: slow step-to pattern with decreased RLE WB tolerance due to pain. cues for upright posture.      Balance Overall balance assessment: Needs assistance Sitting-balance support: Feet supported, No upper extremity supported Sitting balance-Leahy Scale: Good Sitting balance - Comments: sitting EOB   Standing balance support: Reliant on assistive device for balance, Bilateral upper extremity supported, During functional activity Standing balance-Leahy Scale: Poor Standing balance comment: with RW support                            Cognition Arousal: Alert Behavior During Therapy: WFL for tasks assessed/performed Overall Cognitive Status: Impaired/Different from baseline                                          Exercises      General Comments        Pertinent Vitals/Pain Pain Assessment Pain Assessment: 0-10 Pain Score: 8  Pain Location: RLE with mobility Pain Descriptors / Indicators: Sore, Grimacing, Guarding Pain Intervention(s): Monitored during session, Limited activity within patient's tolerance  PT Goals (current goals can now be found in the care plan section) Acute Rehab PT Goals Patient Stated Goal: figure out why she keeps falling PT Goal Formulation: With patient Time For Goal Achievement: 01/06/23 Progress towards PT goals: Progressing toward goals    Frequency    Min 1X/week       AM-PAC PT "6 Clicks" Mobility    Outcome Measure  Help needed turning from your back to your side while in a flat bed without using bedrails?: A Little Help needed moving from lying on your back to sitting on the side of a flat bed without using bedrails?: A Little Help needed moving to and from a bed to a chair (including a wheelchair)?: A Little Help needed standing up from a chair using your arms (e.g., wheelchair or bedside chair)?: A Little Help needed to walk in hospital room?: A Little Help needed climbing 3-5 steps with a railing? : A Lot 6 Click Score: 17    End of Session Equipment Utilized During Treatment: Gait belt Activity Tolerance: Patient tolerated treatment well;Patient limited by pain Patient left: with call bell/phone within reach;in chair;with chair alarm set Nurse Communication: Mobility status PT Visit Diagnosis: Unsteadiness on feet (R26.81);Other abnormalities of gait and mobility (R26.89);Muscle weakness (generalized) (M62.81);Repeated falls (R29.6);Difficulty in walking, not elsewhere classified (R26.2);Pain     Time: 6045-4098 PT Time Calculation (min) (ACUTE ONLY): 18 min  Charges:    $Gait Training: 8-22 mins PT General Charges $$ ACUTE PT VISIT: 1 Visit                    Johny Shock, PTA Acute Rehabilitation Services Secure Chat Preferred  [phone]    Johny Shock 01/03/2023, 2:06 PM

## 2023-01-03 NOTE — Progress Notes (Signed)
Mobility Specialist Progress Note:   01/03/23 1558  Mobility  Activity Ambulated with assistance in hallway  Level of Assistance Contact guard assist, steadying assist  Assistive Device Front wheel walker  Distance Ambulated (ft) 80 ft  RLE Weight Bearing WBAT  Activity Response Tolerated well  Mobility Referral Yes  $Mobility charge 1 Mobility  Mobility Specialist Start Time (ACUTE ONLY) 1545  Mobility Specialist Stop Time (ACUTE ONLY) 1557  Mobility Specialist Time Calculation (min) (ACUTE ONLY) 12 min   Pre Mobility: 87 HR During Mobility: 105 HR  Post Mobility: 74 HR  Pt received in bed, agreeable to mobility. Pt able to ambulate in hallway with CG for safety. Asymptomatic throughout. VSS. Pt returned to bed with call bell in reach and all needs met.  [name]  Mobility Specialist Please contact via [employer] office at [phone]

## 2023-01-03 NOTE — Progress Notes (Signed)
Mobility Specialist Progress Note:   01/03/23 1105  Mobility  Activity Ambulated with assistance in room  Level of Assistance Contact guard assist, steadying assist  Assistive Device Front wheel walker  Distance Ambulated (ft) 60 ft  RLE Weight Bearing WBAT  Activity Response Tolerated well  Mobility Referral Yes  $Mobility charge 1 Mobility  Mobility Specialist Start Time (ACUTE ONLY) 0945  Mobility Specialist Stop Time (ACUTE ONLY) 1005  Mobility Specialist Time Calculation (min) (ACUTE ONLY) 20 min   Pre Mobility: 110 HR During Mobility: 123 HR  Post Mobility: 85 HR   Pt received in bed, agreeable to mobility. SB to stand. CG during ambulation. Pt able to tolerate increased gait distance and displayed faster gait pace during ambulation. Pt c/o RLE pain, otherwise asymptomatic throughout. Pt returned to bed with call bell in reach and all needs met. Bed alarm on.    Leory Plowman  Mobility Specialist Please contact via [employer] [facility] office at [phone]

## 2023-01-03 NOTE — Progress Notes (Signed)
    Progress Note   Subjective  Chief Complaint: Black stools and anemia  This morning, patient is unaware of any further black stools.  She does not think that anyone has been to pick up her pill capsule endoscopy yet.  No new complaints.   Objective   Vital signs in last 24 hours: Temp:  [97.9 F (36.6 C)-98.5 F (36.9 C)] 98 F (36.7 C) (10/25 1100) Pulse Rate:  [74-111] 76 (10/25 1100) Resp:  [13-20] 13 (10/25 1100) BP: (108-163)/(59-100) 125/99 (10/25 1100) SpO2:  [90 %-95 %] 94 % (10/25 0835) Last BM Date : 01/01/23 General:    white female in NAD Heart:  Regular rate and rhythm; no murmurs Lungs: Respirations even and unlabored, lungs CTA bilaterally Abdomen:  Soft, nontender and nondistended. Normal bowel sounds. Psych:  Cooperative. Normal mood and affect.  Intake/Output from previous day: 10/24 0701 - 10/25 0700 In: 340 [P.O.:240; I.V.:100] Out: 350 [Urine:350]   Lab Results: Recent Labs    01/01/23 0758  WBC 10.5  HGB 10.3*  HCT 31.0*  PLT 527*   BMET Recent Labs    01/01/23 0758  NA 139  K 3.0*  CL 104  CO2 23  GLUCOSE 84  BUN 8  CREATININE 0.62  CALCIUM 8.7*    Assessment / Plan:   Assessment: 1.  Anemia: Acute drop in hemoglobin 11--> 6.1 with black stool and hypotension, had been on Lovenox, hemoglobin improved to 10 after only 2 units, BUN elevated over the past few days, improved now, push enteroscopy 10/22 unrevealing for source, colonoscopy 01/02/2023 with only fair prep, diverticulosis and no etiology for bleeding, pill capsule endoscopy dropped-results pending 2.  Falls/fractures: Hemorrhagic contusion of the cerebrum, acute on chronic T12 compression fracture and right pubic fracture following fall at home 3.  Chronic anticoagulation: On Eliquis-on hold 4.  History of Barrett's esophagus: Found on EGD in 2019, last EGD February 2022 5.  Chronic pancreatitis: CT showed scattered parenchymal calcifications and intraductal calcifications  as well as some cystic lesions in the pancreas thought to represent sidebranch IPMN's 6.  Chronically elevated alk phos: Improved 7.  History of colon polyps: Colonoscopy yesterday with no polyps but only fair bowel prep  Plan: 1.  Await results from pill capsule endoscopy. 2.  Continue to monitor hemoglobin with transfusion as needed less than 7  Thank you for your kind consultation, we will continue to follow.       LOS: 12 days   Unk Lightning  01/03/2023, 11:49 AM

## 2023-01-04 ENCOUNTER — Encounter (HOSPITAL_COMMUNITY): Payer: Self-pay | Admitting: Gastroenterology

## 2023-01-04 NOTE — Progress Notes (Signed)
Assessment & Plan: 72F with dementia and frequent falls   Hemorrhagic contusion of cerebrum - NSGY c/s (Dr. Maurice Small), keppra x7d, SLP for cog eval Acute on chronic T12 compression fracture - NSGY c/s, Dr. Maurice Small, nonop, activity as tolerated Anticoagulant use - Kcentra given at [facility] ED. Previous discharge summaries recommended stopping eliquis due to falls. Question per NSGY of cerebral amyloid angiopathy and high risk if AC resumed. Minimum 1w off AC per NSGY.  R pubic rami fx - ortho c/s, Dr. Charlann Boxer, pain control, nonop, WBAT, PT/OT Chronic pain - followed by Dr. Mikael Spray. On subutex, tramadol, benzodiazepines. Home meds reordered 10/15 COPD GERD Hx CVA Cognitive impairment Depression/ anxiety HTN - home meds reordered 10/15 ABL Anemia - s/p 2u PRBCs 10/20, hgb stable at 10.3 10/23. Fecal occult blood positive. Unsure if anemia is 2/2 GIB or injuries. GI performed EGD 10/22 which was unremarkable. Colonoscopy 10/24 without identified source of bleeding. Capsule endoscopy with blood in distal small bowel, no active bleeding source identified, suspect AVM.  Recommend against re-starting anticoagulation. UTI - 5 days PO abx completed   FEN - reg diet  VTE- SCDs. Lovenox ID - PO macrobid x 5 days completed   Dispo - 4E. Therapies - rec SNF  Awaiting SNF bed availability for discharge.        Marissa Level, MD [facility] practice Office: [phone]        Chief Complaint: Fall  Subjective: Patient in bed, comfortable.  Mild leg pain.  Tolerating diet.  Objective: Vital signs in last 24 hours: Temp:  [97.9 F (36.6 C)-98.4 F (36.9 C)] 98.2 F (36.8 C) (10/26 0800) Pulse Rate:  [63-90] 74 (10/26 0902) Resp:  [12-17] 16 (10/26 0902) BP: (90-149)/(42-99) 125/63 (10/26 0902) SpO2:  [91 %-100 %] 100 % (10/26 0902) Last BM Date : 01/01/23  Intake/Output from previous day: 10/25 0701 - 10/26 0700 In: 720 [P.O.:720] Out: 550  [Urine:550] Intake/Output this shift: Total I/O In: 120 [P.O.:120] Out: -   Physical Exam: HEENT - sclerae clear, mucous membranes moist  Lab Results:  Recent Labs    01/03/23 1106  WBC 10.2  HGB 9.9*  HCT 30.2*  PLT 596*   BMET No results for input(s): "NA", "K", "CL", "CO2", "GLUCOSE", "BUN", "CREATININE", "CALCIUM" in the last 72 hours. PT/INR No results for input(s): "LABPROT", "INR" in the last 72 hours. Comprehensive Metabolic Panel:    Component Value Date/Time   NA 139 01/01/2023 0758   NA 144 12/29/2022 1522   NA 144 10/14/2016 1030   NA 143 08/08/2016 1358   K 3.0 (L) 01/01/2023 0758   K 4.0 12/29/2022 1522   CL 104 01/01/2023 0758   CL 112 (H) 12/29/2022 1522   CO2 23 01/01/2023 0758   CO2 24 12/29/2022 1522   BUN 8 01/01/2023 0758   BUN 48 (H) 12/29/2022 1522   BUN 13 10/14/2016 1030   BUN 11 08/08/2016 1358   CREATININE 0.62 01/01/2023 0758   CREATININE 0.88 12/29/2022 1522   GLUCOSE 84 01/01/2023 0758   GLUCOSE 85 12/29/2022 1522   CALCIUM 8.7 (L) 01/01/2023 0758   CALCIUM 8.3 (L) 12/29/2022 1522   AST 27 12/22/2022 1245   AST 21 12/09/2022 1134   ALT 30 12/22/2022 1245   ALT 60 (H) 12/09/2022 1134   ALKPHOS 134 (H) 12/22/2022 1245   ALKPHOS 163 (H) 12/09/2022 1134   BILITOT 0.8 12/22/2022 1245   BILITOT 0.5 12/09/2022 1134   BILITOT  0.2 10/14/2016 1030   BILITOT <0.2 07/12/2016 1859   PROT 6.5 12/22/2022 1245   PROT 6.2 (L) 12/09/2022 1134   PROT 6.6 10/14/2016 1030   PROT 6.6 07/12/2016 1859   ALBUMIN 3.3 (L) 12/22/2022 1245   ALBUMIN 3.2 (L) 12/09/2022 1134   ALBUMIN 4.4 10/14/2016 1030   ALBUMIN 4.3 07/12/2016 1859    Studies/Results: No results found.    [name] 01/04/2023  Patient ID: [name], female   DOB: 09-26-1950, 72 y.o.   MRN: [id number]

## 2023-01-04 NOTE — Progress Notes (Signed)
Mobility Specialist Progress Note:   01/04/23 1057  Mobility  Activity Ambulated with assistance in hallway  Level of Assistance  (MinG)  Assistive Device Front wheel walker  Distance Ambulated (ft) 110 ft  RLE Weight Bearing WBAT  Activity Response Tolerated well  Mobility Referral Yes  $Mobility charge 1 Mobility  Mobility Specialist Start Time (ACUTE ONLY) 1015  Mobility Specialist Stop Time (ACUTE ONLY) 1028  Mobility Specialist Time Calculation (min) (ACUTE ONLY) 13 min   Pre Mobility: 89 HR During Mobility: 110 HR Post Mobility: 93 HR   Pt received in bed, agreeable to mobility. Pt able to tolerate increased gait distance. C/o slight RLE pain, otherwise asymptomatic throughout. Pt returned to bed with call bell in reach and all needs met.   Leory Plowman  Mobility Specialist Please contact via [employer] office at [phone]

## 2023-01-04 NOTE — Plan of Care (Signed)

## 2023-01-04 NOTE — TOC Progression Note (Addendum)
Transition of Care [facility]) - Progression Note    Patient Details  Name: [name] MRN: [id number] Date of Birth: [birth year]  [facility]) CM/SW Contact  Delilah Shan, LCSWA Phone Number: 01/04/2023, 10:39 AM  Clinical Narrative:     Patient has SN[facility] bed at [facility] when medically ready. Insurance authorization approved from 10/25-10/29. CSW will continue to follow and assist with patients dc planning needs.  Expected Discharge Plan: Skilled Nursing Facility Barriers to Discharge: Continued Medical Work up  Expected Discharge Plan and Services     Post Acute Care Choice: Skilled Nursing Facility                                         Social Determinants of Health (SDOH) Interventions SDOH Screenings   Food Insecurity: No Food Insecurity (12/09/2022)  Housing: Low Risk  (12/09/2022)  Transportation Needs: No Transportation Needs (12/09/2022)  Utilities: Not At Risk (12/09/2022)  Social Connections: Unknown (07/21/2021)   Received from [facility], [facility]  Stress: Stress Concern Present (05/10/2020)   Received from [facility], [facility]  Tobacco Use: High Risk (01/02/2023)    Readmission Risk Interventions    12/03/2022   10:10 AM 03/01/2022   11:53 AM  Readmission Risk Prevention Plan  Transportation Screening Complete Complete  Home Care Screening Complete Complete  Medication Review (RN CM)  Complete

## 2023-01-05 DIAGNOSIS — J45909 Unspecified asthma, uncomplicated: Secondary | ICD-10-CM | POA: Diagnosis not present

## 2023-01-05 DIAGNOSIS — Z87898 Personal history of other specified conditions: Secondary | ICD-10-CM | POA: Diagnosis not present

## 2023-01-05 DIAGNOSIS — S22000D Wedge compression fracture of unspecified thoracic vertebra, subsequent encounter for fracture with routine healing: Secondary | ICD-10-CM | POA: Diagnosis not present

## 2023-01-05 DIAGNOSIS — S32591D Other specified fracture of right pubis, subsequent encounter for fracture with routine healing: Secondary | ICD-10-CM | POA: Diagnosis not present

## 2023-01-05 DIAGNOSIS — G301 Alzheimer's disease with late onset: Secondary | ICD-10-CM | POA: Diagnosis not present

## 2023-01-05 DIAGNOSIS — G8929 Other chronic pain: Secondary | ICD-10-CM | POA: Diagnosis not present

## 2023-01-05 DIAGNOSIS — S22088A Other fracture of T11-T12 vertebra, initial encounter for closed fracture: Secondary | ICD-10-CM | POA: Diagnosis not present

## 2023-01-05 DIAGNOSIS — E440 Moderate protein-calorie malnutrition: Secondary | ICD-10-CM | POA: Diagnosis not present

## 2023-01-05 DIAGNOSIS — S22080D Wedge compression fracture of T11-T12 vertebra, subsequent encounter for fracture with routine healing: Secondary | ICD-10-CM | POA: Diagnosis not present

## 2023-01-05 DIAGNOSIS — R8290 Unspecified abnormal findings in urine: Secondary | ICD-10-CM | POA: Diagnosis not present

## 2023-01-05 DIAGNOSIS — R531 Weakness: Secondary | ICD-10-CM | POA: Diagnosis not present

## 2023-01-05 DIAGNOSIS — R197 Diarrhea, unspecified: Secondary | ICD-10-CM | POA: Diagnosis not present

## 2023-01-05 DIAGNOSIS — M4855XS Collapsed vertebra, not elsewhere classified, thoracolumbar region, sequela of fracture: Secondary | ICD-10-CM | POA: Diagnosis not present

## 2023-01-05 DIAGNOSIS — S32592D Other specified fracture of left pubis, subsequent encounter for fracture with routine healing: Secondary | ICD-10-CM | POA: Diagnosis not present

## 2023-01-05 DIAGNOSIS — M6281 Muscle weakness (generalized): Secondary | ICD-10-CM | POA: Diagnosis not present

## 2023-01-05 DIAGNOSIS — Z7401 Bed confinement status: Secondary | ICD-10-CM | POA: Diagnosis not present

## 2023-01-05 DIAGNOSIS — R300 Dysuria: Secondary | ICD-10-CM | POA: Diagnosis not present

## 2023-01-05 DIAGNOSIS — S32591A Other specified fracture of right pubis, initial encounter for closed fracture: Secondary | ICD-10-CM | POA: Diagnosis not present

## 2023-01-05 DIAGNOSIS — M79604 Pain in right leg: Secondary | ICD-10-CM | POA: Diagnosis not present

## 2023-01-05 DIAGNOSIS — R739 Hyperglycemia, unspecified: Secondary | ICD-10-CM | POA: Diagnosis not present

## 2023-01-05 DIAGNOSIS — L89616 Pressure-induced deep tissue damage of right heel: Secondary | ICD-10-CM | POA: Diagnosis not present

## 2023-01-05 DIAGNOSIS — D62 Acute posthemorrhagic anemia: Secondary | ICD-10-CM | POA: Diagnosis not present

## 2023-01-05 DIAGNOSIS — S32599A Other specified fracture of unspecified pubis, initial encounter for closed fracture: Secondary | ICD-10-CM | POA: Diagnosis not present

## 2023-01-05 DIAGNOSIS — Z86718 Personal history of other venous thrombosis and embolism: Secondary | ICD-10-CM | POA: Diagnosis not present

## 2023-01-05 DIAGNOSIS — D509 Iron deficiency anemia, unspecified: Secondary | ICD-10-CM | POA: Diagnosis not present

## 2023-01-05 DIAGNOSIS — N390 Urinary tract infection, site not specified: Secondary | ICD-10-CM | POA: Diagnosis not present

## 2023-01-05 DIAGNOSIS — S32810A Multiple fractures of pelvis with stable disruption of pelvic ring, initial encounter for closed fracture: Secondary | ICD-10-CM | POA: Diagnosis not present

## 2023-01-05 DIAGNOSIS — G8194 Hemiplegia, unspecified affecting left nondominant side: Secondary | ICD-10-CM | POA: Diagnosis not present

## 2023-01-05 DIAGNOSIS — E876 Hypokalemia: Secondary | ICD-10-CM | POA: Diagnosis not present

## 2023-01-05 DIAGNOSIS — D649 Anemia, unspecified: Secondary | ICD-10-CM | POA: Diagnosis not present

## 2023-01-05 DIAGNOSIS — I619 Nontraumatic intracerebral hemorrhage, unspecified: Secondary | ICD-10-CM | POA: Diagnosis not present

## 2023-01-05 DIAGNOSIS — M549 Dorsalgia, unspecified: Secondary | ICD-10-CM | POA: Diagnosis not present

## 2023-01-05 DIAGNOSIS — M159 Polyosteoarthritis, unspecified: Secondary | ICD-10-CM | POA: Diagnosis not present

## 2023-01-05 DIAGNOSIS — J449 Chronic obstructive pulmonary disease, unspecified: Secondary | ICD-10-CM | POA: Diagnosis not present

## 2023-01-05 DIAGNOSIS — G309 Alzheimer's disease, unspecified: Secondary | ICD-10-CM | POA: Diagnosis not present

## 2023-01-05 DIAGNOSIS — M797 Fibromyalgia: Secondary | ICD-10-CM | POA: Diagnosis not present

## 2023-01-05 DIAGNOSIS — I10 Essential (primary) hypertension: Secondary | ICD-10-CM | POA: Diagnosis not present

## 2023-01-05 DIAGNOSIS — K5900 Constipation, unspecified: Secondary | ICD-10-CM | POA: Diagnosis not present

## 2023-01-05 DIAGNOSIS — N181 Chronic kidney disease, stage 1: Secondary | ICD-10-CM | POA: Diagnosis not present

## 2023-01-05 DIAGNOSIS — R110 Nausea: Secondary | ICD-10-CM | POA: Diagnosis not present

## 2023-01-05 DIAGNOSIS — G894 Chronic pain syndrome: Secondary | ICD-10-CM | POA: Diagnosis not present

## 2023-01-05 DIAGNOSIS — L89626 Pressure-induced deep tissue damage of left heel: Secondary | ICD-10-CM | POA: Diagnosis not present

## 2023-01-05 DIAGNOSIS — S22000A Wedge compression fracture of unspecified thoracic vertebra, initial encounter for closed fracture: Secondary | ICD-10-CM | POA: Diagnosis not present

## 2023-01-05 DIAGNOSIS — R1110 Vomiting, unspecified: Secondary | ICD-10-CM | POA: Diagnosis not present

## 2023-01-05 DIAGNOSIS — I5032 Chronic diastolic (congestive) heart failure: Secondary | ICD-10-CM | POA: Diagnosis not present

## 2023-01-05 DIAGNOSIS — Z79899 Other long term (current) drug therapy: Secondary | ICD-10-CM | POA: Diagnosis not present

## 2023-01-05 DIAGNOSIS — G4700 Insomnia, unspecified: Secondary | ICD-10-CM | POA: Diagnosis not present

## 2023-01-05 DIAGNOSIS — S32599D Other specified fracture of unspecified pubis, subsequent encounter for fracture with routine healing: Secondary | ICD-10-CM | POA: Diagnosis not present

## 2023-01-05 DIAGNOSIS — S0633AD Contusion and laceration of cerebrum, unspecified, with loss of consciousness status unknown, subsequent encounter: Secondary | ICD-10-CM | POA: Diagnosis not present

## 2023-01-05 DIAGNOSIS — M456 Ankylosing spondylitis lumbar region: Secondary | ICD-10-CM | POA: Diagnosis not present

## 2023-01-05 DIAGNOSIS — K861 Other chronic pancreatitis: Secondary | ICD-10-CM | POA: Diagnosis not present

## 2023-01-05 DIAGNOSIS — K567 Ileus, unspecified: Secondary | ICD-10-CM | POA: Diagnosis not present

## 2023-01-05 DIAGNOSIS — I69398 Other sequelae of cerebral infarction: Secondary | ICD-10-CM | POA: Diagnosis not present

## 2023-01-05 DIAGNOSIS — Z743 Need for continuous supervision: Secondary | ICD-10-CM | POA: Diagnosis not present

## 2023-01-05 DIAGNOSIS — R109 Unspecified abdominal pain: Secondary | ICD-10-CM | POA: Diagnosis not present

## 2023-01-05 DIAGNOSIS — S06330A Contusion and laceration of cerebrum, unspecified, without loss of consciousness, initial encounter: Secondary | ICD-10-CM | POA: Diagnosis not present

## 2023-01-05 DIAGNOSIS — R58 Hemorrhage, not elsewhere classified: Secondary | ICD-10-CM | POA: Diagnosis not present

## 2023-01-05 LAB — CBC
HCT: 32.9 % — ABNORMAL LOW (ref 36.0–46.0)
Hemoglobin: 10.6 g/dL — ABNORMAL LOW (ref 12.0–15.0)
MCH: 29 pg (ref 26.0–34.0)
MCHC: 32.2 g/dL (ref 30.0–36.0)
MCV: 90.1 fL (ref 80.0–100.0)
Platelets: 675 10*3/uL — ABNORMAL HIGH (ref 150–400)
RBC: 3.65 MIL/uL — ABNORMAL LOW (ref 3.87–5.11)
RDW: 17.7 % — ABNORMAL HIGH (ref 11.5–15.5)
WBC: 9.8 10*3/uL (ref 4.0–10.5)
nRBC: 0 % (ref 0.0–0.2)

## 2023-01-05 MED ORDER — BUPRENORPHINE HCL 8 MG SL SUBL: 4.0000 mg | SUBLINGUAL_TABLET | Freq: Four times a day (QID) | SUBLINGUAL | 0 refills | Status: AC

## 2023-01-05 MED ORDER — POLYETHYLENE GLYCOL 3350 17 G PO PACK: 17.0000 g | PACK | Freq: Every day | ORAL | 0 refills | Status: AC

## 2023-01-05 MED ORDER — TAMSULOSIN HCL 0.4 MG PO CAPS: 0.4000 mg | ORAL_CAPSULE | Freq: Every day | ORAL | 0 refills | Status: AC

## 2023-01-05 MED ORDER — LORAZEPAM 0.5 MG PO TABS: 0.5000 mg | ORAL_TABLET | Freq: Two times a day (BID) | ORAL | 0 refills | Status: AC

## 2023-01-05 MED ORDER — METHOCARBAMOL 500 MG PO TABS: 500.0000 mg | ORAL_TABLET | Freq: Four times a day (QID) | ORAL | 0 refills | Status: AC | PRN

## 2023-01-05 MED ORDER — DOCUSATE SODIUM 100 MG PO CAPS: 100.0000 mg | ORAL_CAPSULE | Freq: Two times a day (BID) | ORAL | 0 refills | Status: AC

## 2023-01-05 NOTE — TOC Progression Note (Signed)
Transition of Care [facility]) - Progression Note    Patient Details  Name: [name] MRN: [id number] Date of Birth: [birth year]  Transition [facility] CM/SW Contact  Patrice Paradise, LCSW Phone Number: 01/05/2023, 9:48 AM  Clinical Narrative:     CSW was alerted that pt was medically ready for DC today. CSW spoke with Kiva at [facility] and she stated that currently she does not have any female beds however is waiting on DC. She informed CSW that she would follow back up later.  TOC team will continue to assist with discharge planning needs.   Expected Discharge Plan: Skilled Nursing Facility Barriers to Discharge: Continued Medical Work up  Expected Discharge Plan and Services     Post Acute Care Choice: Skilled Nursing Facility                                         Social Determinants of Health (SDOH) Interventions SDOH Screenings   Food Insecurity: No Food Insecurity (12/09/2022)  Housing: Low Risk  (12/09/2022)  Transportation Needs: No Transportation Needs (12/09/2022)  Utilities: Not At Risk (12/09/2022)  Social Connections: Unknown (07/21/2021)   Received from [facility], [facility]  Stress: Stress Concern Present (05/10/2020)   Received from [facility], [facility]  Tobacco Use: High Risk (01/02/2023)    Readmission Risk Interventions    12/03/2022   10:10 AM 03/01/2022   11:53 AM  Readmission Risk Prevention Plan  Transportation Screening Complete Complete  Home Care Screening Complete Complete  Medication Review (RN CM)  Complete

## 2023-01-05 NOTE — Progress Notes (Signed)
Mobility Specialist Progress Note:   01/05/23 1227  Mobility  Activity Ambulated with assistance in hallway  Level of Assistance  (MinG)  Assistive Device Front wheel walker  Distance Ambulated (ft) 115 ft  RLE Weight Bearing WBAT  Activity Response Tolerated well  Mobility Referral Yes  $Mobility charge 1 Mobility  Mobility Specialist Start Time (ACUTE ONLY) 1140  Mobility Specialist Stop Time (ACUTE ONLY) 1155  Mobility Specialist Time Calculation (min) (ACUTE ONLY) 15 min   Pre Mobility: 91 HR , 98% SpO2 During Mobility: 101 HR , Post Mobility: 87 HR , 100% SpO2  Pt received in bed, agreeable to mobility. Pt making steady progress in mobility with faster gait pace. Asymptomatic throughout. Pt left in bed with call bell in reach and all needs met.   Leory Plowman  Mobility Specialist Please contact via [employer] office at [phone]

## 2023-01-05 NOTE — TOC Transition Note (Addendum)
Transition of Care [facility]) - CM/SW Discharge Note   Patient Details  Name: [name] MRN: [id number] Date of Birth: January 22, 1951  Transition of Care [facility]) CM/SW Contact:  Patrice Paradise, LCSW Phone Number: 01/05/2023, 2:13 PM   Clinical Narrative:     Patient will DC to:?[facility] Anticipated DC date:?01/05/2023 Family notified:?Brother-left message Transport by: Sharin Mons   Per MD patient ready for DC to?. RN, patient, patient's family, and facility notified of DC. Discharge Summary sent to facility. RN given number for report [phone] room 127a. DC packet on chart. Ambulance transport requested for patient for 5pm..  CSW signing off.   Judd Lien, [place] [phone]     Barriers to Discharge: Continued Medical Work up   Patient Goals and CMS Choice CMS Medicare.gov Compare Post Acute Care list provided to::  (son)    Discharge Placement                         Discharge Plan and Services Additional resources added to the After Visit Summary for       Post Acute Care Choice: Skilled Nursing Facility                               Social Determinants of Health (SDOH) Interventions SDOH Screenings   Food Insecurity: No Food Insecurity (12/09/2022)  Housing: Low Risk  (12/09/2022)  Transportation Needs: No Transportation Needs (12/09/2022)  Utilities: Not At Risk (12/09/2022)  Social Connections: Unknown (07/21/2021)   Received from [facility], [facility]  Stress: Stress Concern Present (05/10/2020)   Received from [facility], [facility]  Tobacco Use: High Risk (01/02/2023)     Readmission Risk Interventions    12/03/2022   10:10 AM 03/01/2022   11:53 AM  Readmission Risk Prevention Plan  Transportation Screening Complete Complete  Home Care Screening Complete Complete  Medication Review (RN CM)  Complete

## 2023-01-05 NOTE — Progress Notes (Signed)
Attempted to call [facility] multiple times in different time and remains un successful,tried to reach out once the PTAR took the patient, but could not speak with the RN advice administrative department to call this RN back once the RN is ready to take report and gave the my phone number

## 2023-01-05 NOTE — Progress Notes (Signed)
Progress Note  3 Days Post-Op  Subjective: No new complaints. Had a BM yesterday which was not bloody or black. No nausea/vomiting  Objective: Vital signs in last 24 hours: Temp:  [98 F (36.7 C)-98.4 F (36.9 C)] 98.4 F (36.9 C) (10/27 0725) Pulse Rate:  [64-83] 66 (10/27 0818) Resp:  [10-16] 16 (10/27 0818) BP: (99-135)/(46-69) 124/69 (10/27 0818) SpO2:  [93 %-100 %] 100 % (10/27 0818) Last BM Date : 01/02/23 (per patient)  Intake/Output from previous day: 10/26 0701 - 10/27 0700 In: 1200 [P.O.:1200] Out: 850 [Urine:850] Intake/Output this shift: No intake/output data recorded.  PE: General: pleasant, WD, female who is laying in bed in NAD HEENT: head is normocephalic, atraumatic.  Sclera are noninjected.  Pupils equal and round. EOMs intact.  Ears and nose without any masses or lesions.  Mouth is pink and moist Heart: regular, rate, and rhythm.   Lungs:  Respiratory effort nonlabored Abd: soft, NT, ND MSK: all 4 extremities are symmetrical with no cyanosis, clubbing, or edema. Skin: warm and dry  Psych: A&Ox3 with an appropriate affect.    Lab Results:  Recent Labs    01/03/23 1106  WBC 10.2  HGB 9.9*  HCT 30.2*  PLT 596*   BMET No results for input(s): "NA", "K", "CL", "CO2", "GLUCOSE", "BUN", "CREATININE", "CALCIUM" in the last 72 hours. PT/INR No results for input(s): "LABPROT", "INR" in the last 72 hours. CMP     Component Value Date/Time   NA 139 01/01/2023 0758   NA 144 10/14/2016 1030   K 3.0 (L) 01/01/2023 0758   CL 104 01/01/2023 0758   CO2 23 01/01/2023 0758   GLUCOSE 84 01/01/2023 0758   BUN 8 01/01/2023 0758   BUN 13 10/14/2016 1030   CREATININE 0.62 01/01/2023 0758   CALCIUM 8.7 (L) 01/01/2023 0758   PROT 6.5 12/22/2022 1245   PROT 6.6 10/14/2016 1030   ALBUMIN 3.3 (L) 12/22/2022 1245   ALBUMIN 4.4 10/14/2016 1030   AST 27 12/22/2022 1245   ALT 30 12/22/2022 1245   ALKPHOS 134 (H) 12/22/2022 1245   BILITOT 0.8 12/22/2022 1245    BILITOT 0.2 10/14/2016 1030   GFRNONAA >60 01/01/2023 0758   GFRAA >60 05/06/2019 0936   Lipase     Component Value Date/Time   LIPASE 60 (H) 12/01/2022 1450       Studies/Results: No results found.  Anti-infectives: Anti-infectives (From admission, onward)    Start     Dose/Rate Route Frequency Ordered Stop   12/28/22 1000  nitrofurantoin (macrocrystal-monohydrate) (MACROBID) capsule 100 mg        100 mg Oral Every 12 hours 12/28/22 0736 01/02/23 0959   12/28/22 0000  nitrofurantoin, macrocrystal-monohydrate, (MACROBID) 100 MG capsule        100 mg Oral Every 12 hours 12/28/22 0737          Assessment/Plan 70F with dementia and frequent falls   Hemorrhagic contusion of cerebrum - NSGY c/s (Dr. Maurice Small), keppra x7d, SLP for cog eval Acute on chronic T12 compression fracture - NSGY c/s, Dr. Maurice Small, nonop, activity as tolerated Anticoagulant use - Kcentra given at [facility] ED. Previous discharge summaries recommended stopping eliquis due to falls. Question per NSGY of cerebral amyloid angiopathy and high risk if AC resumed. Minimum 1w off AC per NSGY.  R pubic rami fx - ortho c/s, Dr. Charlann Boxer, pain control, nonop, WBAT, PT/OT Chronic pain - followed by Dr. Mikael Spray. On subutex, tramadol, benzodiazepines. Home meds reordered 10/15 COPD GERD  Hx CVA Cognitive impairment Depression/ anxiety HTN - home meds reordered 10/15 ABL Anemia - s/p 2u PRBCs 10/20, hgb stable at 10.3 10/23. Fecal occult blood positive. Unsure if anemia is 2/2 GIB or injuries. GI performed EGD 10/22 which was unremarkable. Colonoscopy 10/24 without identified source of bleeding. Capsule endoscopy with blood in distal small bowel, no active bleeding source identified, suspect AVM.  Recommend against re-starting anticoagulation which I have discussed with patient. UTI - 5 days PO abx completed   FEN - reg diet  VTE- SCDs. Lovenox ID - PO macrobid x 5 days completed   Dispo - 4E. Therapies - rec SNF.  Insurance approved. Medically stable for dc to SNF  Awaiting SNF bed availability for discharge.  I reviewed last 24 h vitals and pain scores, last 48 h intake and output, last 24 h labs and trends, and last 24 h imaging results   LOS: 14 days   Eric Form, [facility] 01/05/2023, 9:11 AM Please see Amion for pager number during day hours 7:00am-4:30pm

## 2023-01-06 ENCOUNTER — Encounter (HOSPITAL_COMMUNITY): Payer: Self-pay | Admitting: Gastroenterology

## 2023-01-06 DIAGNOSIS — L89626 Pressure-induced deep tissue damage of left heel: Secondary | ICD-10-CM | POA: Diagnosis not present

## 2023-01-06 DIAGNOSIS — M6281 Muscle weakness (generalized): Secondary | ICD-10-CM | POA: Diagnosis not present

## 2023-01-06 DIAGNOSIS — S22000D Wedge compression fracture of unspecified thoracic vertebra, subsequent encounter for fracture with routine healing: Secondary | ICD-10-CM | POA: Diagnosis not present

## 2023-01-06 DIAGNOSIS — L89616 Pressure-induced deep tissue damage of right heel: Secondary | ICD-10-CM | POA: Diagnosis not present

## 2023-01-06 DIAGNOSIS — S32599D Other specified fracture of unspecified pubis, subsequent encounter for fracture with routine healing: Secondary | ICD-10-CM | POA: Diagnosis not present

## 2023-01-06 DIAGNOSIS — G309 Alzheimer's disease, unspecified: Secondary | ICD-10-CM | POA: Diagnosis not present

## 2023-01-06 DIAGNOSIS — M549 Dorsalgia, unspecified: Secondary | ICD-10-CM | POA: Diagnosis not present

## 2023-01-07 DIAGNOSIS — G4700 Insomnia, unspecified: Secondary | ICD-10-CM | POA: Diagnosis not present

## 2023-01-07 DIAGNOSIS — D62 Acute posthemorrhagic anemia: Secondary | ICD-10-CM | POA: Diagnosis not present

## 2023-01-07 DIAGNOSIS — G309 Alzheimer's disease, unspecified: Secondary | ICD-10-CM | POA: Diagnosis not present

## 2023-01-08 DIAGNOSIS — S32599A Other specified fracture of unspecified pubis, initial encounter for closed fracture: Secondary | ICD-10-CM | POA: Diagnosis not present

## 2023-01-08 DIAGNOSIS — S22000A Wedge compression fracture of unspecified thoracic vertebra, initial encounter for closed fracture: Secondary | ICD-10-CM | POA: Diagnosis not present

## 2023-01-08 DIAGNOSIS — I619 Nontraumatic intracerebral hemorrhage, unspecified: Secondary | ICD-10-CM | POA: Diagnosis not present

## 2023-01-08 DIAGNOSIS — I10 Essential (primary) hypertension: Secondary | ICD-10-CM | POA: Diagnosis not present

## 2023-01-09 DIAGNOSIS — D649 Anemia, unspecified: Secondary | ICD-10-CM | POA: Diagnosis not present

## 2023-01-09 DIAGNOSIS — R739 Hyperglycemia, unspecified: Secondary | ICD-10-CM | POA: Diagnosis not present

## 2023-01-09 DIAGNOSIS — R531 Weakness: Secondary | ICD-10-CM | POA: Diagnosis not present

## 2023-01-09 DIAGNOSIS — N181 Chronic kidney disease, stage 1: Secondary | ICD-10-CM | POA: Diagnosis not present

## 2023-01-09 DIAGNOSIS — K5900 Constipation, unspecified: Secondary | ICD-10-CM | POA: Diagnosis not present

## 2023-01-10 DIAGNOSIS — G4700 Insomnia, unspecified: Secondary | ICD-10-CM | POA: Diagnosis not present

## 2023-01-10 DIAGNOSIS — Z79899 Other long term (current) drug therapy: Secondary | ICD-10-CM | POA: Diagnosis not present

## 2023-01-10 DIAGNOSIS — R531 Weakness: Secondary | ICD-10-CM | POA: Diagnosis not present

## 2023-01-10 DIAGNOSIS — G309 Alzheimer's disease, unspecified: Secondary | ICD-10-CM | POA: Diagnosis not present

## 2023-01-11 DIAGNOSIS — G8929 Other chronic pain: Secondary | ICD-10-CM | POA: Diagnosis not present

## 2023-01-11 DIAGNOSIS — R531 Weakness: Secondary | ICD-10-CM | POA: Diagnosis not present

## 2023-01-11 DIAGNOSIS — G309 Alzheimer's disease, unspecified: Secondary | ICD-10-CM | POA: Diagnosis not present

## 2023-01-11 DIAGNOSIS — Z79899 Other long term (current) drug therapy: Secondary | ICD-10-CM | POA: Diagnosis not present

## 2023-01-11 DIAGNOSIS — N390 Urinary tract infection, site not specified: Secondary | ICD-10-CM | POA: Diagnosis not present

## 2023-01-13 DIAGNOSIS — L89616 Pressure-induced deep tissue damage of right heel: Secondary | ICD-10-CM | POA: Diagnosis not present

## 2023-01-13 DIAGNOSIS — D649 Anemia, unspecified: Secondary | ICD-10-CM | POA: Diagnosis not present

## 2023-01-13 DIAGNOSIS — G309 Alzheimer's disease, unspecified: Secondary | ICD-10-CM | POA: Diagnosis not present

## 2023-01-13 DIAGNOSIS — Z79899 Other long term (current) drug therapy: Secondary | ICD-10-CM | POA: Diagnosis not present

## 2023-01-13 DIAGNOSIS — R531 Weakness: Secondary | ICD-10-CM | POA: Diagnosis not present

## 2023-01-13 DIAGNOSIS — L89626 Pressure-induced deep tissue damage of left heel: Secondary | ICD-10-CM | POA: Diagnosis not present

## 2023-01-14 DIAGNOSIS — R110 Nausea: Secondary | ICD-10-CM | POA: Diagnosis not present

## 2023-01-14 DIAGNOSIS — S32599D Other specified fracture of unspecified pubis, subsequent encounter for fracture with routine healing: Secondary | ICD-10-CM | POA: Diagnosis not present

## 2023-01-14 DIAGNOSIS — R531 Weakness: Secondary | ICD-10-CM | POA: Diagnosis not present

## 2023-01-15 DIAGNOSIS — M6281 Muscle weakness (generalized): Secondary | ICD-10-CM | POA: Diagnosis not present

## 2023-01-15 DIAGNOSIS — R110 Nausea: Secondary | ICD-10-CM | POA: Diagnosis not present

## 2023-01-15 DIAGNOSIS — Z79899 Other long term (current) drug therapy: Secondary | ICD-10-CM | POA: Diagnosis not present

## 2023-01-16 DIAGNOSIS — Z87898 Personal history of other specified conditions: Secondary | ICD-10-CM | POA: Diagnosis not present

## 2023-01-16 DIAGNOSIS — G301 Alzheimer's disease with late onset: Secondary | ICD-10-CM | POA: Diagnosis not present

## 2023-01-17 DIAGNOSIS — M6281 Muscle weakness (generalized): Secondary | ICD-10-CM | POA: Diagnosis not present

## 2023-01-17 DIAGNOSIS — S32599D Other specified fracture of unspecified pubis, subsequent encounter for fracture with routine healing: Secondary | ICD-10-CM | POA: Diagnosis not present

## 2023-01-19 DIAGNOSIS — M79604 Pain in right leg: Secondary | ICD-10-CM | POA: Diagnosis not present

## 2023-01-19 DIAGNOSIS — R110 Nausea: Secondary | ICD-10-CM | POA: Diagnosis not present

## 2023-01-19 DIAGNOSIS — S32599D Other specified fracture of unspecified pubis, subsequent encounter for fracture with routine healing: Secondary | ICD-10-CM | POA: Diagnosis not present

## 2023-01-20 DIAGNOSIS — L89616 Pressure-induced deep tissue damage of right heel: Secondary | ICD-10-CM | POA: Diagnosis not present

## 2023-01-20 DIAGNOSIS — M6281 Muscle weakness (generalized): Secondary | ICD-10-CM | POA: Diagnosis not present

## 2023-01-20 DIAGNOSIS — R110 Nausea: Secondary | ICD-10-CM | POA: Diagnosis not present

## 2023-01-20 DIAGNOSIS — L89626 Pressure-induced deep tissue damage of left heel: Secondary | ICD-10-CM | POA: Diagnosis not present

## 2023-01-20 DIAGNOSIS — S32599D Other specified fracture of unspecified pubis, subsequent encounter for fracture with routine healing: Secondary | ICD-10-CM | POA: Diagnosis not present

## 2023-01-21 DIAGNOSIS — R197 Diarrhea, unspecified: Secondary | ICD-10-CM | POA: Diagnosis not present

## 2023-01-21 DIAGNOSIS — M6281 Muscle weakness (generalized): Secondary | ICD-10-CM | POA: Diagnosis not present

## 2023-01-21 DIAGNOSIS — R110 Nausea: Secondary | ICD-10-CM | POA: Diagnosis not present

## 2023-01-22 DIAGNOSIS — R110 Nausea: Secondary | ICD-10-CM | POA: Diagnosis not present

## 2023-01-22 DIAGNOSIS — R109 Unspecified abdominal pain: Secondary | ICD-10-CM | POA: Diagnosis not present

## 2023-01-22 DIAGNOSIS — M6281 Muscle weakness (generalized): Secondary | ICD-10-CM | POA: Diagnosis not present

## 2023-01-22 DIAGNOSIS — R197 Diarrhea, unspecified: Secondary | ICD-10-CM | POA: Diagnosis not present

## 2023-01-23 DIAGNOSIS — R110 Nausea: Secondary | ICD-10-CM | POA: Diagnosis not present

## 2023-01-23 DIAGNOSIS — K567 Ileus, unspecified: Secondary | ICD-10-CM | POA: Diagnosis not present

## 2023-01-23 DIAGNOSIS — S0633AD Contusion and laceration of cerebrum, unspecified, with loss of consciousness status unknown, subsequent encounter: Secondary | ICD-10-CM | POA: Diagnosis not present

## 2023-01-23 DIAGNOSIS — R109 Unspecified abdominal pain: Secondary | ICD-10-CM | POA: Diagnosis not present

## 2023-01-23 DIAGNOSIS — S32591D Other specified fracture of right pubis, subsequent encounter for fracture with routine healing: Secondary | ICD-10-CM | POA: Diagnosis not present

## 2023-01-23 DIAGNOSIS — R1110 Vomiting, unspecified: Secondary | ICD-10-CM | POA: Diagnosis not present

## 2023-01-23 DIAGNOSIS — G309 Alzheimer's disease, unspecified: Secondary | ICD-10-CM | POA: Diagnosis not present

## 2023-01-23 DIAGNOSIS — S32592D Other specified fracture of left pubis, subsequent encounter for fracture with routine healing: Secondary | ICD-10-CM | POA: Diagnosis not present

## 2023-01-23 DIAGNOSIS — M6281 Muscle weakness (generalized): Secondary | ICD-10-CM | POA: Diagnosis not present

## 2023-01-24 DIAGNOSIS — S32810A Multiple fractures of pelvis with stable disruption of pelvic ring, initial encounter for closed fracture: Secondary | ICD-10-CM | POA: Diagnosis not present

## 2023-01-24 DIAGNOSIS — S32599D Other specified fracture of unspecified pubis, subsequent encounter for fracture with routine healing: Secondary | ICD-10-CM | POA: Diagnosis not present

## 2023-01-24 DIAGNOSIS — M6281 Muscle weakness (generalized): Secondary | ICD-10-CM | POA: Diagnosis not present

## 2023-01-24 DIAGNOSIS — K567 Ileus, unspecified: Secondary | ICD-10-CM | POA: Diagnosis not present

## 2023-01-24 DIAGNOSIS — G309 Alzheimer's disease, unspecified: Secondary | ICD-10-CM | POA: Diagnosis not present

## 2023-01-26 DIAGNOSIS — Z79899 Other long term (current) drug therapy: Secondary | ICD-10-CM | POA: Diagnosis not present

## 2023-01-26 DIAGNOSIS — M6281 Muscle weakness (generalized): Secondary | ICD-10-CM | POA: Diagnosis not present

## 2023-01-26 DIAGNOSIS — K567 Ileus, unspecified: Secondary | ICD-10-CM | POA: Diagnosis not present

## 2023-01-27 DIAGNOSIS — M6281 Muscle weakness (generalized): Secondary | ICD-10-CM | POA: Diagnosis not present

## 2023-01-27 DIAGNOSIS — M549 Dorsalgia, unspecified: Secondary | ICD-10-CM | POA: Diagnosis not present

## 2023-01-27 DIAGNOSIS — G309 Alzheimer's disease, unspecified: Secondary | ICD-10-CM | POA: Diagnosis not present

## 2023-01-27 DIAGNOSIS — L89626 Pressure-induced deep tissue damage of left heel: Secondary | ICD-10-CM | POA: Diagnosis not present

## 2023-01-27 DIAGNOSIS — L89616 Pressure-induced deep tissue damage of right heel: Secondary | ICD-10-CM | POA: Diagnosis not present

## 2023-01-29 ENCOUNTER — Other Ambulatory Visit: Payer: Self-pay | Admitting: *Deleted

## 2023-01-29 DIAGNOSIS — R8290 Unspecified abnormal findings in urine: Secondary | ICD-10-CM | POA: Diagnosis not present

## 2023-01-29 DIAGNOSIS — R531 Weakness: Secondary | ICD-10-CM | POA: Diagnosis not present

## 2023-01-29 NOTE — Patient Outreach (Signed)
Mrs. [name] resides in [facility] skilled nursing facility.  Screening for potential chronic care management services as a benefit of health plan.  Telephonic collaborative meeting with Wilkie Aye, [facility] social worker and Dipam, Sales promotion account executive. Mrs. [name] recently appealed discharge and won. Will return to [facility] in [place] upon SNF discharge.   No identifiable chronic care management needs.   Raiford Noble, MSN, RN, BSN [place]  [facility], [facility] RN Post- Acute Care Manager Direct Dial: [phone]

## 2023-01-30 DIAGNOSIS — R300 Dysuria: Secondary | ICD-10-CM | POA: Diagnosis not present

## 2023-01-30 DIAGNOSIS — N390 Urinary tract infection, site not specified: Secondary | ICD-10-CM | POA: Diagnosis not present

## 2023-01-30 DIAGNOSIS — R8290 Unspecified abnormal findings in urine: Secondary | ICD-10-CM | POA: Diagnosis not present

## 2023-02-01 DIAGNOSIS — N390 Urinary tract infection, site not specified: Secondary | ICD-10-CM | POA: Diagnosis not present

## 2023-02-01 DIAGNOSIS — G4700 Insomnia, unspecified: Secondary | ICD-10-CM | POA: Diagnosis not present

## 2023-02-02 DIAGNOSIS — G4700 Insomnia, unspecified: Secondary | ICD-10-CM | POA: Diagnosis not present

## 2023-02-02 DIAGNOSIS — N390 Urinary tract infection, site not specified: Secondary | ICD-10-CM | POA: Diagnosis not present

## 2023-02-04 DIAGNOSIS — N390 Urinary tract infection, site not specified: Secondary | ICD-10-CM | POA: Diagnosis not present

## 2023-02-04 DIAGNOSIS — G309 Alzheimer's disease, unspecified: Secondary | ICD-10-CM | POA: Diagnosis not present

## 2023-02-06 DIAGNOSIS — S32592D Other specified fracture of left pubis, subsequent encounter for fracture with routine healing: Secondary | ICD-10-CM | POA: Diagnosis not present

## 2023-02-06 DIAGNOSIS — G309 Alzheimer's disease, unspecified: Secondary | ICD-10-CM | POA: Diagnosis not present

## 2023-02-06 DIAGNOSIS — G4700 Insomnia, unspecified: Secondary | ICD-10-CM | POA: Diagnosis not present

## 2023-02-06 DIAGNOSIS — M6281 Muscle weakness (generalized): Secondary | ICD-10-CM | POA: Diagnosis not present

## 2023-02-07 DIAGNOSIS — S32592D Other specified fracture of left pubis, subsequent encounter for fracture with routine healing: Secondary | ICD-10-CM | POA: Diagnosis not present

## 2023-02-07 DIAGNOSIS — G309 Alzheimer's disease, unspecified: Secondary | ICD-10-CM | POA: Diagnosis not present

## 2023-02-07 DIAGNOSIS — M6281 Muscle weakness (generalized): Secondary | ICD-10-CM | POA: Diagnosis not present

## 2023-02-09 DIAGNOSIS — M549 Dorsalgia, unspecified: Secondary | ICD-10-CM | POA: Diagnosis not present

## 2023-02-09 DIAGNOSIS — R531 Weakness: Secondary | ICD-10-CM | POA: Diagnosis not present

## 2023-02-09 DIAGNOSIS — S32592D Other specified fracture of left pubis, subsequent encounter for fracture with routine healing: Secondary | ICD-10-CM | POA: Diagnosis not present

## 2023-02-09 DIAGNOSIS — M6281 Muscle weakness (generalized): Secondary | ICD-10-CM | POA: Diagnosis not present

## 2023-02-10 DIAGNOSIS — M6281 Muscle weakness (generalized): Secondary | ICD-10-CM | POA: Diagnosis not present

## 2023-02-10 DIAGNOSIS — S32592D Other specified fracture of left pubis, subsequent encounter for fracture with routine healing: Secondary | ICD-10-CM | POA: Diagnosis not present

## 2023-02-11 DIAGNOSIS — M6281 Muscle weakness (generalized): Secondary | ICD-10-CM | POA: Diagnosis not present

## 2023-02-11 DIAGNOSIS — S32592D Other specified fracture of left pubis, subsequent encounter for fracture with routine healing: Secondary | ICD-10-CM | POA: Diagnosis not present

## 2023-02-12 DIAGNOSIS — S32592D Other specified fracture of left pubis, subsequent encounter for fracture with routine healing: Secondary | ICD-10-CM | POA: Diagnosis not present

## 2023-02-12 DIAGNOSIS — M6281 Muscle weakness (generalized): Secondary | ICD-10-CM | POA: Diagnosis not present

## 2023-02-12 DIAGNOSIS — N390 Urinary tract infection, site not specified: Secondary | ICD-10-CM | POA: Diagnosis not present

## 2023-02-12 DIAGNOSIS — G4700 Insomnia, unspecified: Secondary | ICD-10-CM | POA: Diagnosis not present

## 2023-02-13 DIAGNOSIS — S32592D Other specified fracture of left pubis, subsequent encounter for fracture with routine healing: Secondary | ICD-10-CM | POA: Diagnosis not present

## 2023-02-13 DIAGNOSIS — Z87898 Personal history of other specified conditions: Secondary | ICD-10-CM | POA: Diagnosis not present

## 2023-02-13 DIAGNOSIS — M6281 Muscle weakness (generalized): Secondary | ICD-10-CM | POA: Diagnosis not present

## 2023-02-14 DIAGNOSIS — S32592D Other specified fracture of left pubis, subsequent encounter for fracture with routine healing: Secondary | ICD-10-CM | POA: Diagnosis not present

## 2023-02-14 DIAGNOSIS — M6281 Muscle weakness (generalized): Secondary | ICD-10-CM | POA: Diagnosis not present

## 2023-02-15 DIAGNOSIS — M6281 Muscle weakness (generalized): Secondary | ICD-10-CM | POA: Diagnosis not present

## 2023-02-15 DIAGNOSIS — S32592D Other specified fracture of left pubis, subsequent encounter for fracture with routine healing: Secondary | ICD-10-CM | POA: Diagnosis not present

## 2023-02-16 DIAGNOSIS — S32592D Other specified fracture of left pubis, subsequent encounter for fracture with routine healing: Secondary | ICD-10-CM | POA: Diagnosis not present

## 2023-02-16 DIAGNOSIS — M6281 Muscle weakness (generalized): Secondary | ICD-10-CM | POA: Diagnosis not present

## 2023-02-17 DIAGNOSIS — M6281 Muscle weakness (generalized): Secondary | ICD-10-CM | POA: Diagnosis not present

## 2023-02-17 DIAGNOSIS — S32592D Other specified fracture of left pubis, subsequent encounter for fracture with routine healing: Secondary | ICD-10-CM | POA: Diagnosis not present

## 2023-02-18 DIAGNOSIS — M549 Dorsalgia, unspecified: Secondary | ICD-10-CM | POA: Diagnosis not present

## 2023-02-18 DIAGNOSIS — G309 Alzheimer's disease, unspecified: Secondary | ICD-10-CM | POA: Diagnosis not present

## 2023-02-18 DIAGNOSIS — R531 Weakness: Secondary | ICD-10-CM | POA: Diagnosis not present

## 2023-02-18 DIAGNOSIS — M6281 Muscle weakness (generalized): Secondary | ICD-10-CM | POA: Diagnosis not present

## 2023-02-18 DIAGNOSIS — S32592D Other specified fracture of left pubis, subsequent encounter for fracture with routine healing: Secondary | ICD-10-CM | POA: Diagnosis not present

## 2023-02-19 DIAGNOSIS — G309 Alzheimer's disease, unspecified: Secondary | ICD-10-CM | POA: Diagnosis not present

## 2023-02-19 DIAGNOSIS — M549 Dorsalgia, unspecified: Secondary | ICD-10-CM | POA: Diagnosis not present

## 2023-02-19 DIAGNOSIS — R531 Weakness: Secondary | ICD-10-CM | POA: Diagnosis not present

## 2023-02-19 DIAGNOSIS — S32592D Other specified fracture of left pubis, subsequent encounter for fracture with routine healing: Secondary | ICD-10-CM | POA: Diagnosis not present

## 2023-02-19 DIAGNOSIS — M6281 Muscle weakness (generalized): Secondary | ICD-10-CM | POA: Diagnosis not present

## 2023-02-20 ENCOUNTER — Other Ambulatory Visit: Payer: Self-pay | Admitting: *Deleted

## 2023-02-20 DIAGNOSIS — L089 Local infection of the skin and subcutaneous tissue, unspecified: Secondary | ICD-10-CM | POA: Diagnosis not present

## 2023-02-20 DIAGNOSIS — M6281 Muscle weakness (generalized): Secondary | ICD-10-CM | POA: Diagnosis not present

## 2023-02-20 DIAGNOSIS — S32592D Other specified fracture of left pubis, subsequent encounter for fracture with routine healing: Secondary | ICD-10-CM | POA: Diagnosis not present

## 2023-02-20 DIAGNOSIS — R238 Other skin changes: Secondary | ICD-10-CM | POA: Diagnosis not present

## 2023-02-20 DIAGNOSIS — G309 Alzheimer's disease, unspecified: Secondary | ICD-10-CM | POA: Diagnosis not present

## 2023-02-20 NOTE — Patient Outreach (Signed)
Late entry for 02/19/23  Marissa Plano resides in [facility]. Spoke with [facility] discharge planning team and therapy manager. Marissa. Cardenas is currently receiving therapy under Part B services.   Marissa. Stiefel will return to ALF.   No identifiable complex care management needs.   Raiford Noble, MSN, RN, BSN [place]  [facility], [facility] RN Post- Acute Care Manager Direct Dial: [phone]

## 2023-02-21 DIAGNOSIS — G309 Alzheimer's disease, unspecified: Secondary | ICD-10-CM | POA: Diagnosis not present

## 2023-02-21 DIAGNOSIS — L089 Local infection of the skin and subcutaneous tissue, unspecified: Secondary | ICD-10-CM | POA: Diagnosis not present

## 2023-02-21 DIAGNOSIS — R531 Weakness: Secondary | ICD-10-CM | POA: Diagnosis not present

## 2023-02-22 DIAGNOSIS — Z87891 Personal history of nicotine dependence: Secondary | ICD-10-CM | POA: Diagnosis not present

## 2023-02-22 DIAGNOSIS — Z8673 Personal history of transient ischemic attack (TIA), and cerebral infarction without residual deficits: Secondary | ICD-10-CM | POA: Diagnosis not present

## 2023-02-22 DIAGNOSIS — I509 Heart failure, unspecified: Secondary | ICD-10-CM | POA: Diagnosis not present

## 2023-02-22 DIAGNOSIS — M797 Fibromyalgia: Secondary | ICD-10-CM | POA: Diagnosis not present

## 2023-02-22 DIAGNOSIS — S32501D Unspecified fracture of right pubis, subsequent encounter for fracture with routine healing: Secondary | ICD-10-CM | POA: Diagnosis not present

## 2023-02-22 DIAGNOSIS — G243 Spasmodic torticollis: Secondary | ICD-10-CM | POA: Diagnosis not present

## 2023-02-22 DIAGNOSIS — Z9181 History of falling: Secondary | ICD-10-CM | POA: Diagnosis not present

## 2023-02-22 DIAGNOSIS — S32502D Unspecified fracture of left pubis, subsequent encounter for fracture with routine healing: Secondary | ICD-10-CM | POA: Diagnosis not present

## 2023-02-22 DIAGNOSIS — K219 Gastro-esophageal reflux disease without esophagitis: Secondary | ICD-10-CM | POA: Diagnosis not present

## 2023-02-22 DIAGNOSIS — S22088D Other fracture of T11-T12 vertebra, subsequent encounter for fracture with routine healing: Secondary | ICD-10-CM | POA: Diagnosis not present

## 2023-02-22 DIAGNOSIS — J449 Chronic obstructive pulmonary disease, unspecified: Secondary | ICD-10-CM | POA: Diagnosis not present

## 2023-02-22 DIAGNOSIS — G894 Chronic pain syndrome: Secondary | ICD-10-CM | POA: Diagnosis not present

## 2023-02-22 DIAGNOSIS — K5790 Diverticulosis of intestine, part unspecified, without perforation or abscess without bleeding: Secondary | ICD-10-CM | POA: Diagnosis not present

## 2023-02-22 DIAGNOSIS — Z7951 Long term (current) use of inhaled steroids: Secondary | ICD-10-CM | POA: Diagnosis not present

## 2023-02-22 DIAGNOSIS — I110 Hypertensive heart disease with heart failure: Secondary | ICD-10-CM | POA: Diagnosis not present

## 2023-02-22 DIAGNOSIS — Z79899 Other long term (current) drug therapy: Secondary | ICD-10-CM | POA: Diagnosis not present

## 2023-02-22 DIAGNOSIS — Z9049 Acquired absence of other specified parts of digestive tract: Secondary | ICD-10-CM | POA: Diagnosis not present

## 2023-02-22 DIAGNOSIS — M159 Polyosteoarthritis, unspecified: Secondary | ICD-10-CM | POA: Diagnosis not present

## 2023-02-22 DIAGNOSIS — G4700 Insomnia, unspecified: Secondary | ICD-10-CM | POA: Diagnosis not present

## 2023-02-22 DIAGNOSIS — M459 Ankylosing spondylitis of unspecified sites in spine: Secondary | ICD-10-CM | POA: Diagnosis not present

## 2023-02-25 DIAGNOSIS — Z8673 Personal history of transient ischemic attack (TIA), and cerebral infarction without residual deficits: Secondary | ICD-10-CM | POA: Diagnosis not present

## 2023-02-25 DIAGNOSIS — S32501D Unspecified fracture of right pubis, subsequent encounter for fracture with routine healing: Secondary | ICD-10-CM | POA: Diagnosis not present

## 2023-02-25 DIAGNOSIS — S32502D Unspecified fracture of left pubis, subsequent encounter for fracture with routine healing: Secondary | ICD-10-CM | POA: Diagnosis not present

## 2023-02-25 DIAGNOSIS — Z7951 Long term (current) use of inhaled steroids: Secondary | ICD-10-CM | POA: Diagnosis not present

## 2023-02-25 DIAGNOSIS — K219 Gastro-esophageal reflux disease without esophagitis: Secondary | ICD-10-CM | POA: Diagnosis not present

## 2023-02-25 DIAGNOSIS — Z87891 Personal history of nicotine dependence: Secondary | ICD-10-CM | POA: Diagnosis not present

## 2023-02-25 DIAGNOSIS — S22088D Other fracture of T11-T12 vertebra, subsequent encounter for fracture with routine healing: Secondary | ICD-10-CM | POA: Diagnosis not present

## 2023-02-25 DIAGNOSIS — M159 Polyosteoarthritis, unspecified: Secondary | ICD-10-CM | POA: Diagnosis not present

## 2023-02-25 DIAGNOSIS — I509 Heart failure, unspecified: Secondary | ICD-10-CM | POA: Diagnosis not present

## 2023-02-25 DIAGNOSIS — Z9049 Acquired absence of other specified parts of digestive tract: Secondary | ICD-10-CM | POA: Diagnosis not present

## 2023-02-25 DIAGNOSIS — G4700 Insomnia, unspecified: Secondary | ICD-10-CM | POA: Diagnosis not present

## 2023-02-25 DIAGNOSIS — K5790 Diverticulosis of intestine, part unspecified, without perforation or abscess without bleeding: Secondary | ICD-10-CM | POA: Diagnosis not present

## 2023-02-25 DIAGNOSIS — G243 Spasmodic torticollis: Secondary | ICD-10-CM | POA: Diagnosis not present

## 2023-02-25 DIAGNOSIS — Z9181 History of falling: Secondary | ICD-10-CM | POA: Diagnosis not present

## 2023-02-25 DIAGNOSIS — M459 Ankylosing spondylitis of unspecified sites in spine: Secondary | ICD-10-CM | POA: Diagnosis not present

## 2023-02-25 DIAGNOSIS — G894 Chronic pain syndrome: Secondary | ICD-10-CM | POA: Diagnosis not present

## 2023-02-25 DIAGNOSIS — Z79899 Other long term (current) drug therapy: Secondary | ICD-10-CM | POA: Diagnosis not present

## 2023-02-25 DIAGNOSIS — M797 Fibromyalgia: Secondary | ICD-10-CM | POA: Diagnosis not present

## 2023-02-25 DIAGNOSIS — J449 Chronic obstructive pulmonary disease, unspecified: Secondary | ICD-10-CM | POA: Diagnosis not present

## 2023-02-25 DIAGNOSIS — I110 Hypertensive heart disease with heart failure: Secondary | ICD-10-CM | POA: Diagnosis not present

## 2023-02-27 DIAGNOSIS — G894 Chronic pain syndrome: Secondary | ICD-10-CM | POA: Diagnosis not present

## 2023-02-27 DIAGNOSIS — Z9181 History of falling: Secondary | ICD-10-CM | POA: Diagnosis not present

## 2023-02-27 DIAGNOSIS — I110 Hypertensive heart disease with heart failure: Secondary | ICD-10-CM | POA: Diagnosis not present

## 2023-02-27 DIAGNOSIS — J449 Chronic obstructive pulmonary disease, unspecified: Secondary | ICD-10-CM | POA: Diagnosis not present

## 2023-02-27 DIAGNOSIS — Z79899 Other long term (current) drug therapy: Secondary | ICD-10-CM | POA: Diagnosis not present

## 2023-02-27 DIAGNOSIS — S32502D Unspecified fracture of left pubis, subsequent encounter for fracture with routine healing: Secondary | ICD-10-CM | POA: Diagnosis not present

## 2023-02-27 DIAGNOSIS — K5790 Diverticulosis of intestine, part unspecified, without perforation or abscess without bleeding: Secondary | ICD-10-CM | POA: Diagnosis not present

## 2023-02-27 DIAGNOSIS — Z87891 Personal history of nicotine dependence: Secondary | ICD-10-CM | POA: Diagnosis not present

## 2023-02-27 DIAGNOSIS — S32501D Unspecified fracture of right pubis, subsequent encounter for fracture with routine healing: Secondary | ICD-10-CM | POA: Diagnosis not present

## 2023-02-27 DIAGNOSIS — G243 Spasmodic torticollis: Secondary | ICD-10-CM | POA: Diagnosis not present

## 2023-02-27 DIAGNOSIS — Z9049 Acquired absence of other specified parts of digestive tract: Secondary | ICD-10-CM | POA: Diagnosis not present

## 2023-02-27 DIAGNOSIS — M797 Fibromyalgia: Secondary | ICD-10-CM | POA: Diagnosis not present

## 2023-02-27 DIAGNOSIS — G4700 Insomnia, unspecified: Secondary | ICD-10-CM | POA: Diagnosis not present

## 2023-02-27 DIAGNOSIS — M459 Ankylosing spondylitis of unspecified sites in spine: Secondary | ICD-10-CM | POA: Diagnosis not present

## 2023-02-27 DIAGNOSIS — Z8673 Personal history of transient ischemic attack (TIA), and cerebral infarction without residual deficits: Secondary | ICD-10-CM | POA: Diagnosis not present

## 2023-02-27 DIAGNOSIS — M159 Polyosteoarthritis, unspecified: Secondary | ICD-10-CM | POA: Diagnosis not present

## 2023-02-27 DIAGNOSIS — Z7951 Long term (current) use of inhaled steroids: Secondary | ICD-10-CM | POA: Diagnosis not present

## 2023-02-27 DIAGNOSIS — I509 Heart failure, unspecified: Secondary | ICD-10-CM | POA: Diagnosis not present

## 2023-02-27 DIAGNOSIS — S22088D Other fracture of T11-T12 vertebra, subsequent encounter for fracture with routine healing: Secondary | ICD-10-CM | POA: Diagnosis not present

## 2023-02-27 DIAGNOSIS — K219 Gastro-esophageal reflux disease without esophagitis: Secondary | ICD-10-CM | POA: Diagnosis not present

## 2023-02-28 DIAGNOSIS — Z9049 Acquired absence of other specified parts of digestive tract: Secondary | ICD-10-CM | POA: Diagnosis not present

## 2023-02-28 DIAGNOSIS — I110 Hypertensive heart disease with heart failure: Secondary | ICD-10-CM | POA: Diagnosis not present

## 2023-02-28 DIAGNOSIS — M797 Fibromyalgia: Secondary | ICD-10-CM | POA: Diagnosis not present

## 2023-02-28 DIAGNOSIS — G894 Chronic pain syndrome: Secondary | ICD-10-CM | POA: Diagnosis not present

## 2023-02-28 DIAGNOSIS — G243 Spasmodic torticollis: Secondary | ICD-10-CM | POA: Diagnosis not present

## 2023-02-28 DIAGNOSIS — Z7951 Long term (current) use of inhaled steroids: Secondary | ICD-10-CM | POA: Diagnosis not present

## 2023-02-28 DIAGNOSIS — J449 Chronic obstructive pulmonary disease, unspecified: Secondary | ICD-10-CM | POA: Diagnosis not present

## 2023-02-28 DIAGNOSIS — Z79899 Other long term (current) drug therapy: Secondary | ICD-10-CM | POA: Diagnosis not present

## 2023-02-28 DIAGNOSIS — S32501D Unspecified fracture of right pubis, subsequent encounter for fracture with routine healing: Secondary | ICD-10-CM | POA: Diagnosis not present

## 2023-02-28 DIAGNOSIS — S32502D Unspecified fracture of left pubis, subsequent encounter for fracture with routine healing: Secondary | ICD-10-CM | POA: Diagnosis not present

## 2023-02-28 DIAGNOSIS — M159 Polyosteoarthritis, unspecified: Secondary | ICD-10-CM | POA: Diagnosis not present

## 2023-02-28 DIAGNOSIS — Z8673 Personal history of transient ischemic attack (TIA), and cerebral infarction without residual deficits: Secondary | ICD-10-CM | POA: Diagnosis not present

## 2023-02-28 DIAGNOSIS — K5790 Diverticulosis of intestine, part unspecified, without perforation or abscess without bleeding: Secondary | ICD-10-CM | POA: Diagnosis not present

## 2023-02-28 DIAGNOSIS — G4700 Insomnia, unspecified: Secondary | ICD-10-CM | POA: Diagnosis not present

## 2023-02-28 DIAGNOSIS — Z9181 History of falling: Secondary | ICD-10-CM | POA: Diagnosis not present

## 2023-02-28 DIAGNOSIS — S22088D Other fracture of T11-T12 vertebra, subsequent encounter for fracture with routine healing: Secondary | ICD-10-CM | POA: Diagnosis not present

## 2023-02-28 DIAGNOSIS — Z87891 Personal history of nicotine dependence: Secondary | ICD-10-CM | POA: Diagnosis not present

## 2023-02-28 DIAGNOSIS — I509 Heart failure, unspecified: Secondary | ICD-10-CM | POA: Diagnosis not present

## 2023-02-28 DIAGNOSIS — M459 Ankylosing spondylitis of unspecified sites in spine: Secondary | ICD-10-CM | POA: Diagnosis not present

## 2023-02-28 DIAGNOSIS — K219 Gastro-esophageal reflux disease without esophagitis: Secondary | ICD-10-CM | POA: Diagnosis not present

## 2023-03-10 ENCOUNTER — Emergency Department (HOSPITAL_COMMUNITY): Payer: 59

## 2023-03-10 ENCOUNTER — Emergency Department (HOSPITAL_COMMUNITY)
Admission: EM | Admit: 2023-03-10 | Discharge: 2023-03-11 | Disposition: A | Discharge status: Home / Self Care | Payer: 59 | Attending: Emergency Medicine | Admitting: Emergency Medicine

## 2023-03-10 ENCOUNTER — Encounter (HOSPITAL_COMMUNITY): Payer: Self-pay | Admitting: Emergency Medicine

## 2023-03-10 ENCOUNTER — Other Ambulatory Visit: Payer: Self-pay

## 2023-03-10 DIAGNOSIS — E876 Hypokalemia: Secondary | ICD-10-CM | POA: Diagnosis not present

## 2023-03-10 DIAGNOSIS — Z7951 Long term (current) use of inhaled steroids: Secondary | ICD-10-CM | POA: Insufficient documentation

## 2023-03-10 DIAGNOSIS — G894 Chronic pain syndrome: Secondary | ICD-10-CM | POA: Diagnosis not present

## 2023-03-10 DIAGNOSIS — K219 Gastro-esophageal reflux disease without esophagitis: Secondary | ICD-10-CM | POA: Diagnosis not present

## 2023-03-10 DIAGNOSIS — S22088D Other fracture of T11-T12 vertebra, subsequent encounter for fracture with routine healing: Secondary | ICD-10-CM | POA: Diagnosis not present

## 2023-03-10 DIAGNOSIS — L03213 Periorbital cellulitis: Secondary | ICD-10-CM | POA: Insufficient documentation

## 2023-03-10 DIAGNOSIS — J449 Chronic obstructive pulmonary disease, unspecified: Secondary | ICD-10-CM | POA: Insufficient documentation

## 2023-03-10 DIAGNOSIS — Z87891 Personal history of nicotine dependence: Secondary | ICD-10-CM | POA: Diagnosis not present

## 2023-03-10 DIAGNOSIS — I110 Hypertensive heart disease with heart failure: Secondary | ICD-10-CM | POA: Diagnosis not present

## 2023-03-10 DIAGNOSIS — Z9181 History of falling: Secondary | ICD-10-CM | POA: Diagnosis not present

## 2023-03-10 DIAGNOSIS — R9431 Abnormal electrocardiogram [ECG] [EKG]: Secondary | ICD-10-CM | POA: Diagnosis not present

## 2023-03-10 DIAGNOSIS — Z8673 Personal history of transient ischemic attack (TIA), and cerebral infarction without residual deficits: Secondary | ICD-10-CM | POA: Diagnosis not present

## 2023-03-10 DIAGNOSIS — R6889 Other general symptoms and signs: Secondary | ICD-10-CM | POA: Diagnosis not present

## 2023-03-10 DIAGNOSIS — S32502D Unspecified fracture of left pubis, subsequent encounter for fracture with routine healing: Secondary | ICD-10-CM | POA: Diagnosis not present

## 2023-03-10 DIAGNOSIS — G243 Spasmodic torticollis: Secondary | ICD-10-CM | POA: Diagnosis not present

## 2023-03-10 DIAGNOSIS — G4700 Insomnia, unspecified: Secondary | ICD-10-CM | POA: Diagnosis not present

## 2023-03-10 DIAGNOSIS — I509 Heart failure, unspecified: Secondary | ICD-10-CM | POA: Diagnosis not present

## 2023-03-10 DIAGNOSIS — Z79899 Other long term (current) drug therapy: Secondary | ICD-10-CM | POA: Insufficient documentation

## 2023-03-10 DIAGNOSIS — M459 Ankylosing spondylitis of unspecified sites in spine: Secondary | ICD-10-CM | POA: Diagnosis not present

## 2023-03-10 DIAGNOSIS — K5790 Diverticulosis of intestine, part unspecified, without perforation or abscess without bleeding: Secondary | ICD-10-CM | POA: Diagnosis not present

## 2023-03-10 DIAGNOSIS — S32501D Unspecified fracture of right pubis, subsequent encounter for fracture with routine healing: Secondary | ICD-10-CM | POA: Diagnosis not present

## 2023-03-10 DIAGNOSIS — I10 Essential (primary) hypertension: Secondary | ICD-10-CM | POA: Insufficient documentation

## 2023-03-10 DIAGNOSIS — Z9049 Acquired absence of other specified parts of digestive tract: Secondary | ICD-10-CM | POA: Diagnosis not present

## 2023-03-10 DIAGNOSIS — R609 Edema, unspecified: Secondary | ICD-10-CM | POA: Diagnosis not present

## 2023-03-10 DIAGNOSIS — R220 Localized swelling, mass and lump, head: Secondary | ICD-10-CM | POA: Diagnosis not present

## 2023-03-10 DIAGNOSIS — M797 Fibromyalgia: Secondary | ICD-10-CM | POA: Diagnosis not present

## 2023-03-10 DIAGNOSIS — M159 Polyosteoarthritis, unspecified: Secondary | ICD-10-CM | POA: Diagnosis not present

## 2023-03-10 LAB — CBC WITH DIFFERENTIAL/PLATELET
Abs Immature Granulocytes: 0.02 10*3/uL (ref 0.00–0.07)
Basophils Absolute: 0 10*3/uL (ref 0.0–0.1)
Basophils Relative: 0 %
Eosinophils Absolute: 0.1 10*3/uL (ref 0.0–0.5)
Eosinophils Relative: 1 %
HCT: 38.1 % (ref 36.0–46.0)
Hemoglobin: 12.3 g/dL (ref 12.0–15.0)
Immature Granulocytes: 0 %
Lymphocytes Relative: 21 %
Lymphs Abs: 1.9 10*3/uL (ref 0.7–4.0)
MCH: 29.2 pg (ref 26.0–34.0)
MCHC: 32.3 g/dL (ref 30.0–36.0)
MCV: 90.5 fL (ref 80.0–100.0)
Monocytes Absolute: 0.9 10*3/uL (ref 0.1–1.0)
Monocytes Relative: 10 %
Neutro Abs: 6.2 10*3/uL (ref 1.7–7.7)
Neutrophils Relative %: 68 %
Platelets: 243 10*3/uL (ref 150–400)
RBC: 4.21 MIL/uL (ref 3.87–5.11)
RDW: 14.8 % (ref 11.5–15.5)
WBC: 9 10*3/uL (ref 4.0–10.5)
nRBC: 0 % (ref 0.0–0.2)

## 2023-03-10 LAB — BASIC METABOLIC PANEL
Anion gap: 7 (ref 5–15)
BUN: 17 mg/dL (ref 8–23)
CO2: 24 mmol/L (ref 22–32)
Calcium: 7.7 mg/dL — ABNORMAL LOW (ref 8.9–10.3)
Chloride: 113 mmol/L — ABNORMAL HIGH (ref 98–111)
Creatinine, Ser: 0.39 mg/dL — ABNORMAL LOW (ref 0.44–1.00)
GFR, Estimated: >60 mL/min (ref >60)
Glucose, Bld: 91 mg/dL (ref 70–99)
Potassium: 2.7 mmol/L — CL (ref 3.5–5.1)
Sodium: 144 mmol/L (ref 135–145)

## 2023-03-10 LAB — MAGNESIUM: Magnesium: 1.5 mg/dL — ABNORMAL LOW (ref 1.7–2.4)

## 2023-03-10 MED ORDER — POTASSIUM CHLORIDE 10 MEQ/100ML IV SOLN
10.0000 meq | INTRAVENOUS | Status: AC
Start: 2023-03-10 — End: 2023-03-11
  Administered 2023-03-10 (×2): 10 meq via INTRAVENOUS
  Filled 2023-03-10 (×2): qty 100

## 2023-03-10 MED ORDER — POTASSIUM CHLORIDE CRYS ER 20 MEQ PO TBCR: 20.0000 meq | EXTENDED_RELEASE_TABLET | Freq: Two times a day (BID) | ORAL | 0 refills | Status: AC

## 2023-03-10 MED ORDER — IOHEXOL 300 MG/ML  SOLN
75.0000 mL | Freq: Once | INTRAMUSCULAR | Status: AC | PRN
  Administered 2023-03-10: 75 mL via INTRAVENOUS

## 2023-03-10 MED ORDER — SODIUM CHLORIDE 0.9 % IV SOLN
3.0000 g | Freq: Once | INTRAVENOUS | Status: AC
  Administered 2023-03-11: 3 g via INTRAVENOUS
  Filled 2023-03-10: qty 8

## 2023-03-10 MED ORDER — AMOXICILLIN-POT CLAVULANATE 875-125 MG PO TABS: 1.0000 | ORAL_TABLET | Freq: Two times a day (BID) | ORAL | 0 refills | Status: AC

## 2023-03-10 MED ORDER — MAGNESIUM SULFATE 2 GM/50ML IV SOLN
2.0000 g | Freq: Once | INTRAVENOUS | Status: AC
  Administered 2023-03-10: 2 g via INTRAVENOUS
  Filled 2023-03-10: qty 50

## 2023-03-10 MED ORDER — POTASSIUM CHLORIDE CRYS ER 20 MEQ PO TBCR
40.0000 meq | EXTENDED_RELEASE_TABLET | Freq: Once | ORAL | Status: AC
  Administered 2023-03-10: 40 meq via ORAL
  Filled 2023-03-10: qty 2

## 2023-03-10 NOTE — Discharge Instructions (Addendum)
You were seen here for low potassium, low magnesium and cellulitis infection of left eye causing swelling.  I sent an antibiotic to your pharmacy.  Please take as prescribed.  I have also sent potassium to your pharmacy please take as prescribed.  Please follow-up with primary care as soon as possible to ensure your symptoms are improving. I would recommend rechecking magnesium and potassium. If symptoms worsen please return to emergency room.

## 2023-03-10 NOTE — ED Notes (Signed)
Patient transported to CT 

## 2023-03-10 NOTE — ED Provider Notes (Signed)
[name]   CSN: [id number] Arrival date & time: 03/10/23  1729     History  Chief Complaint  Patient presents with   Facial Swelling    [name] is a 72 y.o. female with past medical history of substance abuse, hypertension, GERD, COPD, stroke presenting to emergency room with facial swelling.  Reports both of her eyes have been very swollen for the past 2 to 3 days.  She reports drainage out of her left eye.  Denies any eye pain or change in vision.  Patient does report she also had a pimple that was popped in the left side of her cheek which is now swelling.  Denies any fevers, chills or associated symptoms.  Patient did not not have any throat swelling, tongue swelling, shortness of breath.  Patient denies itching or rash.  HPI     Home Medications Prior to Admission medications   Medication Sig Start Date End Date Taking? Authorizing Provider  acetaminophen (TYLENOL) 500 MG tablet Take 2 tablets (1,000 mg total) by mouth every 6 (six) hours. 12/27/22   Juliet Rude, PA-C  albuterol (VENTOLIN HFA) 108 (90 Base) MCG/ACT inhaler Inhale 2 puffs into the lungs every 4 (four) hours as needed for wheezing or shortness of breath. 02/28/22   [provider]  ARIPiprazole (ABILIFY) 5 MG tablet Take 5 mg by mouth daily. 12/20/22   [provider]  ascorbic acid (VITAMIN C) 500 MG tablet Take 500 mg by mouth daily.    [provider]  carvedilol (COREG) 6.25 MG tablet Take 2 tablets (12.5 mg total) by mouth 2 (two) times daily. 12/28/22   Juliet Rude, PA-C  Cholecalciferol (VITAMIN D3) 50 MCG (2000 UT) capsule Take 2,000 Units by mouth 4 (four) times daily.    [provider]  docusate sodium (COLACE) 100 MG capsule Take 1 capsule (100 mg total) by mouth 2 (two) times daily. 01/05/23   Diamantina Monks, MD  donepezil (ARICEPT) 10 MG tablet Take 10 mg by mouth daily. 02/15/22    [provider]  doxepin (SINEQUAN) 50 MG capsule Take 50 mg by mouth daily.    [provider]  famotidine (PEPCID) 20 MG tablet Take 20 mg by mouth daily. 12/20/22   [provider]  fluticasone (FLONASE) 50 MCG/ACT nasal spray Place 1 spray into both nostrils 2 (two) times daily as needed for allergies. 11/12/22   [provider]  ipratropium (ATROVENT) 0.03 % nasal spray Place 2 sprays into both nostrils See admin instructions. Use 2 sprays in each nostril once every three hours as needed for rhinitis.    [provider]  levocetirizine (XYZAL) 5 MG tablet Take 5 mg by mouth daily. 11/22/22   [provider]  LORazepam (ATIVAN) 0.5 MG tablet Take 1 tablet (0.5 mg total) by mouth 2 (two) times daily. Do not administer AFTER 3 01/05/23   Diamantina Monks, MD  losartan (COZAAR) 100 MG tablet Take 0.5 tablets (50 mg total) by mouth daily. Patient taking differently: Take 100 mg by mouth daily. 12/10/22   Vassie Loll, MD  MAGNESIUM CITRATE PO Take 1 capsule by mouth 3 (three) times daily.    [provider]  magnesium oxide (MAG-OX) 400 (240 Mg) MG tablet Take 1 tablet by mouth daily. 11/27/22   [provider]  methocarbamol (ROBAXIN) 500 MG tablet Take 1 tablet (500 mg total) by mouth every 6 (six) hours  as needed for muscle spasms. 01/05/23   Diamantina Monks, MD  mirtazapine (REMERON) 7.5 MG tablet Take 7.5 mg by mouth at bedtime. 02/20/22   [provider]  Multiple Vitamins-Minerals (THERA-TABS M) TABS Take 1 tablet by mouth daily. 11/27/22   [provider]  nitrofurantoin, macrocrystal-monohydrate, (MACROBID) 100 MG capsule Take 1 capsule (100 mg total) by mouth every 12 (twelve) hours. 12/28/22   Juliet Rude, PA-C  Nutritional Supplements (ENSURE ORIGINAL) LIQD Take 1 Container by mouth 3 (three) times daily. 12/19/22   [provider]  OLANZapine (ZYPREXA) 10 MG tablet Take 1 tablet (10 mg  total) by mouth at bedtime. 03/08/22   Sherryll Burger, Pratik D, DO  Omega-3 Fatty Acids (FISH OIL) 1000 MG CAPS Take 1 capsule by mouth 3 (three) times daily. 12/20/22   [provider]  polyethylene glycol (MIRALAX / GLYCOLAX) 17 g packet Take 17 g by mouth daily. 01/05/23   Diamantina Monks, MD  Prasterone (INTRAROSA) 6.5 MG INST Place 1 suppository vaginally at bedtime.    [provider]  tamsulosin (FLOMAX) 0.4 MG CAPS capsule Take 1 capsule (0.4 mg total) by mouth daily. 01/05/23   Diamantina Monks, MD  temazepam (RESTORIL) 22.5 MG capsule Take 22.5 mg by mouth at bedtime.    [provider]  traZODone (DESYREL) 50 MG tablet Take 25 mg by mouth at bedtime. 11/27/22   [provider]  TRELEGY ELLIPTA 100-62.5-25 MCG/ACT AEPB Inhale 1 puff into the lungs daily. 02/28/22   [provider]      Allergies    Nsaids, Tylenol [acetaminophen], Codeine, Tolmetin, and Tramadol    Review of Systems   Review of Systems  HENT:  Positive for facial swelling.     Physical Exam Updated Vital Signs BP 125/65   Pulse 86   Temp 98.6 F (37 C) (Oral)   Resp 18   Ht 5\' 4"  (1.626 m)   Wt 51 kg   SpO2 94%   BMI 19.30 kg/m  Physical Exam Vitals and nursing Cardenas reviewed.  Constitutional:      General: She is not in acute distress.    Appearance: She is not toxic-appearing.  HENT:     Head: Normocephalic and atraumatic.     Right Ear: Tympanic membrane, ear canal and external ear normal.     Left Ear: Tympanic membrane, ear canal and external ear normal.     Nose: No congestion or rhinorrhea.     Mouth/Throat:     Mouth: Mucous membranes are moist.     Pharynx: Oropharynx is clear.     Comments: Wearing dentures, no dental abscess. Eyes:     General: No scleral icterus.    Conjunctiva/sclera: Conjunctivae normal.     Comments: Preorbital discoloration and swelling bilaterally, worse on left, no focal area of tenderness.  No significant surrounding  cellulitis.  Left upper cheek with small area of fluctuance.  Cardiovascular:     Rate and Rhythm: Normal rate and regular rhythm.     Pulses: Normal pulses.     Heart sounds: Normal heart sounds.  Pulmonary:     Effort: Pulmonary effort is normal. No respiratory distress.     Breath sounds: Normal breath sounds.  Abdominal:     General: Abdomen is flat. Bowel sounds are normal.     Palpations: Abdomen is soft.     Tenderness: There is no abdominal tenderness.  Musculoskeletal:     Right lower leg: No edema.  Left lower leg: No edema.  Skin:    General: Skin is warm and dry.     Findings: No lesion.  Neurological:     General: No focal deficit present.     Mental Status: She is alert and oriented to person, place, and time. Mental status is at baseline.     ED Results / Procedures / Treatments   Labs (all labs ordered are listed, but only abnormal results are displayed) Labs Reviewed  BASIC METABOLIC PANEL - Abnormal; Notable for the following components:      Result Value   Potassium 2.7 (*)    Chloride 113 (*)    Creatinine, Ser 0.39 (*)    Calcium 7.7 (*)    All other components within normal limits  MAGNESIUM - Abnormal; Notable for the following components:   Magnesium 1.5 (*)    All other components within normal limits  CBC WITH DIFFERENTIAL/PLATELET    EKG EKG Interpretation Date/Time:  Monday March 10 2023 21:49:56 EST Ventricular Rate:  94 PR Interval:  128 QRS Duration:  91 QT Interval:  364 QTC Calculation: 456 R Axis:   47  Text Interpretation: Sinus rhythm Nonspecific T abnormalities, lateral leads Confirmed by Gloris Manchester ([id number]) on 03/10/2023 10:03:10 PM  Radiology CT Orbits W Contrast Result Date: 03/10/2023 CLINICAL DATA:  Left periorbital swelling EXAM: CT ORBITS WITH CONTRAST TECHNIQUE: Multidetector CT images was performed according to the standard protocol following intravenous contrast administration. RADIATION DOSE REDUCTION: This  exam was performed according to the departmental dose-optimization program which includes automated exposure control, adjustment of the mA and/or kV according to patient size and/or use of iterative reconstruction technique. CONTRAST:  75mL OMNIPAQUE IOHEXOL 300 MG/ML  SOLN COMPARISON:  None Available. FINDINGS: Orbits: No orbital mass or evidence of inflammation. Normal appearance of the globes, optic nerve-sheath complexes, extraocular muscles, orbital fat and lacrimal glands. Visible paranasal sinuses: Clear. Soft tissues: Left lateral periorbital soft tissue swelling and induration. No fluid collection. Osseous: No fracture or aggressive lesion. Limited intracranial: No acute or significant finding. IMPRESSION: 1. Left lateral periorbital soft tissue swelling and induration, consistent with periorbital cellulitis. No fluid collection. 2. No orbital (postseptal) cellulitis. Electronically Signed   By: Deatra Robinson M.D.   On: 03/10/2023 21:42    Procedures Procedures    Medications Ordered in ED Medications - No data to display  ED Course/ Medical Decision Making/ A&P                                 Medical Decision Making Amount and/or Complexity of Data Reviewed Labs: ordered. Radiology: ordered.  Risk Prescription drug management.   This patient presents to the ED for concern of facial swelling, this involves an extensive number of treatment options, and is a complaint that carries with it a high risk of complications and morbidity.  The differential diagnosis includes preorbital cellulitis, cellulitis, abscess, tooth infection, allergic reaction, bacterial conjunctivitis, allergic conjunctivitis   Co morbidities that complicate the patient evaluation  substance abuse, hypertension, GERD, COPD, stroke    Lab Tests:  I personally interpreted labs.  The pertinent results include:   Cbc no leukocytosis, no anemia Bmp potassium 2.7 - supplemented, normal kidney function    Adding on mg. Mg 1.5 - will replace both.    Imaging Studies ordered:  I ordered imaging studies including ct orbits   I independently visualized and interpreted imaging which showed periorbital  cellulitis  I agree with the radiologist interpretation   Cardiac Monitoring: / EKG:  The patient was maintained on a cardiac monitor.  I personally viewed and interpreted the cardiac monitored which showed an underlying rhythm of: no QT prolongation    Consultations Obtained:  None   Problem List / ED Course / Critical interventions / Medication management  Patient reported to emergency room with facial swelling.  Patient's facial swelling is primarily located over the left orbit.  Has some discoloration.  No obvious deformity, ecchymosis or erythema. No difficulty with phonation or handling secretions. No dental infection. No sign of distress.  Ordering CT imaging to assess swelling.  Patient is hemodynamically stable, no elevated white count, no fever no tachycardia.  CT with periorbital cellulitis. Will send with PO antibiotics and close follow up. Pain is well controlled in ER. Patient dose have chronically low Mg, 1.5 today - will give 2G mag. Potassium 2.7, x2 IV K and PO K. Will have recheck with pcp and send with out patient K.  I ordered medication including potassium, checking Mg Reevaluation of the patient after these medicines showed that the patient stayed the same I have reviewed the patients home medicines and have made adjustments as needed   Plan  Mag and Potassium supplemented, offered admission, patient declined. Will send with PO Mg and K Send Abx for infection Stable for d/c  Follow up with PCP         Final Clinical Impression(s) / ED Diagnoses Final diagnoses:  Hypokalemia  Hypomagnesemia  Periorbital cellulitis of left eye    Rx / DC Orders ED Discharge Orders          Ordered    amoxicillin-clavulanate (AUGMENTIN) 875-125 MG tablet  Every  12 hours        03/10/23 2218    potassium chloride SA (KLOR-CON M) 20 MEQ tablet  2 times daily        03/10/23 2218              Smitty Knudsen, PA-C 03/10/23 2336    Gloris Manchester, MD 03/11/23 [phone]

## 2023-03-10 NOTE — ED Notes (Signed)
Swelling noted to the L side of patient's face and eye.

## 2023-03-10 NOTE — ED Triage Notes (Signed)
Pt has been having intermittent facial swelling x 2-3 days.

## 2023-03-11 DIAGNOSIS — L03213 Periorbital cellulitis: Secondary | ICD-10-CM | POA: Diagnosis not present

## 2023-03-11 NOTE — ED Notes (Signed)
Pt has been placed on the transportation list back to facility

## 2023-03-12 DIAGNOSIS — M6281 Muscle weakness (generalized): Secondary | ICD-10-CM | POA: Diagnosis not present

## 2023-03-12 DIAGNOSIS — S32592D Other specified fracture of left pubis, subsequent encounter for fracture with routine healing: Secondary | ICD-10-CM | POA: Diagnosis not present

## 2023-03-12 DIAGNOSIS — S0633AD Contusion and laceration of cerebrum, unspecified, with loss of consciousness status unknown, subsequent encounter: Secondary | ICD-10-CM | POA: Diagnosis not present

## 2023-03-12 DIAGNOSIS — S32591D Other specified fracture of right pubis, subsequent encounter for fracture with routine healing: Secondary | ICD-10-CM | POA: Diagnosis not present

## 2023-03-17 DIAGNOSIS — M797 Fibromyalgia: Secondary | ICD-10-CM | POA: Diagnosis not present

## 2023-03-17 DIAGNOSIS — M459 Ankylosing spondylitis of unspecified sites in spine: Secondary | ICD-10-CM | POA: Diagnosis not present

## 2023-03-17 DIAGNOSIS — G894 Chronic pain syndrome: Secondary | ICD-10-CM | POA: Diagnosis not present

## 2023-03-17 DIAGNOSIS — I509 Heart failure, unspecified: Secondary | ICD-10-CM | POA: Diagnosis not present

## 2023-03-17 DIAGNOSIS — G243 Spasmodic torticollis: Secondary | ICD-10-CM | POA: Diagnosis not present

## 2023-03-17 DIAGNOSIS — Z8673 Personal history of transient ischemic attack (TIA), and cerebral infarction without residual deficits: Secondary | ICD-10-CM | POA: Diagnosis not present

## 2023-03-17 DIAGNOSIS — M159 Polyosteoarthritis, unspecified: Secondary | ICD-10-CM | POA: Diagnosis not present

## 2023-03-17 DIAGNOSIS — Z79899 Other long term (current) drug therapy: Secondary | ICD-10-CM | POA: Diagnosis not present

## 2023-03-17 DIAGNOSIS — G4700 Insomnia, unspecified: Secondary | ICD-10-CM | POA: Diagnosis not present

## 2023-03-17 DIAGNOSIS — K5790 Diverticulosis of intestine, part unspecified, without perforation or abscess without bleeding: Secondary | ICD-10-CM | POA: Diagnosis not present

## 2023-03-17 DIAGNOSIS — Z7951 Long term (current) use of inhaled steroids: Secondary | ICD-10-CM | POA: Diagnosis not present

## 2023-03-17 DIAGNOSIS — J449 Chronic obstructive pulmonary disease, unspecified: Secondary | ICD-10-CM | POA: Diagnosis not present

## 2023-03-17 DIAGNOSIS — Z9049 Acquired absence of other specified parts of digestive tract: Secondary | ICD-10-CM | POA: Diagnosis not present

## 2023-03-17 DIAGNOSIS — S32501D Unspecified fracture of right pubis, subsequent encounter for fracture with routine healing: Secondary | ICD-10-CM | POA: Diagnosis not present

## 2023-03-17 DIAGNOSIS — Z9181 History of falling: Secondary | ICD-10-CM | POA: Diagnosis not present

## 2023-03-17 DIAGNOSIS — S32502D Unspecified fracture of left pubis, subsequent encounter for fracture with routine healing: Secondary | ICD-10-CM | POA: Diagnosis not present

## 2023-03-17 DIAGNOSIS — Z87891 Personal history of nicotine dependence: Secondary | ICD-10-CM | POA: Diagnosis not present

## 2023-03-17 DIAGNOSIS — S22088D Other fracture of T11-T12 vertebra, subsequent encounter for fracture with routine healing: Secondary | ICD-10-CM | POA: Diagnosis not present

## 2023-03-17 DIAGNOSIS — I110 Hypertensive heart disease with heart failure: Secondary | ICD-10-CM | POA: Diagnosis not present

## 2023-03-17 DIAGNOSIS — K219 Gastro-esophageal reflux disease without esophagitis: Secondary | ICD-10-CM | POA: Diagnosis not present

## 2023-03-28 DIAGNOSIS — Z9181 History of falling: Secondary | ICD-10-CM | POA: Diagnosis not present

## 2023-03-28 DIAGNOSIS — Z131 Encounter for screening for diabetes mellitus: Secondary | ICD-10-CM | POA: Diagnosis not present

## 2023-03-28 DIAGNOSIS — F17210 Nicotine dependence, cigarettes, uncomplicated: Secondary | ICD-10-CM | POA: Diagnosis not present

## 2023-03-28 DIAGNOSIS — I639 Cerebral infarction, unspecified: Secondary | ICD-10-CM | POA: Diagnosis not present

## 2023-03-28 DIAGNOSIS — R2689 Other abnormalities of gait and mobility: Secondary | ICD-10-CM | POA: Diagnosis not present

## 2023-03-28 DIAGNOSIS — Z0000 Encounter for general adult medical examination without abnormal findings: Secondary | ICD-10-CM | POA: Diagnosis not present

## 2023-03-28 DIAGNOSIS — R5383 Other fatigue: Secondary | ICD-10-CM | POA: Diagnosis not present

## 2023-03-28 DIAGNOSIS — H109 Unspecified conjunctivitis: Secondary | ICD-10-CM | POA: Diagnosis not present

## 2023-03-28 DIAGNOSIS — E559 Vitamin D deficiency, unspecified: Secondary | ICD-10-CM | POA: Diagnosis not present

## 2023-03-28 DIAGNOSIS — E46 Unspecified protein-calorie malnutrition: Secondary | ICD-10-CM | POA: Diagnosis not present

## 2023-03-28 DIAGNOSIS — R300 Dysuria: Secondary | ICD-10-CM | POA: Diagnosis not present

## 2023-03-28 DIAGNOSIS — E7800 Pure hypercholesterolemia, unspecified: Secondary | ICD-10-CM | POA: Diagnosis not present

## 2023-04-04 DIAGNOSIS — G4700 Insomnia, unspecified: Secondary | ICD-10-CM | POA: Diagnosis not present

## 2023-04-04 DIAGNOSIS — K861 Other chronic pancreatitis: Secondary | ICD-10-CM | POA: Diagnosis not present

## 2023-04-04 DIAGNOSIS — R64 Cachexia: Secondary | ICD-10-CM | POA: Diagnosis not present

## 2023-04-04 DIAGNOSIS — E2740 Unspecified adrenocortical insufficiency: Secondary | ICD-10-CM | POA: Diagnosis not present

## 2023-04-04 DIAGNOSIS — F17210 Nicotine dependence, cigarettes, uncomplicated: Secondary | ICD-10-CM | POA: Diagnosis not present

## 2023-04-04 DIAGNOSIS — Z79899 Other long term (current) drug therapy: Secondary | ICD-10-CM | POA: Diagnosis not present

## 2023-04-04 DIAGNOSIS — M542 Cervicalgia: Secondary | ICD-10-CM | POA: Diagnosis not present

## 2023-04-04 DIAGNOSIS — Z681 Body mass index (BMI) 19 or less, adult: Secondary | ICD-10-CM | POA: Diagnosis not present

## 2023-04-04 DIAGNOSIS — R030 Elevated blood-pressure reading, without diagnosis of hypertension: Secondary | ICD-10-CM | POA: Diagnosis not present

## 2023-04-08 DIAGNOSIS — Z79899 Other long term (current) drug therapy: Secondary | ICD-10-CM | POA: Diagnosis not present

## 2023-04-17 DIAGNOSIS — R300 Dysuria: Secondary | ICD-10-CM | POA: Diagnosis not present

## 2023-04-17 DIAGNOSIS — Z9109 Other allergy status, other than to drugs and biological substances: Secondary | ICD-10-CM | POA: Diagnosis not present

## 2023-04-17 DIAGNOSIS — Z9181 History of falling: Secondary | ICD-10-CM | POA: Diagnosis not present

## 2023-04-17 DIAGNOSIS — I639 Cerebral infarction, unspecified: Secondary | ICD-10-CM | POA: Diagnosis not present

## 2023-04-17 DIAGNOSIS — I10 Essential (primary) hypertension: Secondary | ICD-10-CM | POA: Diagnosis not present

## 2023-04-17 DIAGNOSIS — Z681 Body mass index (BMI) 19 or less, adult: Secondary | ICD-10-CM | POA: Diagnosis not present

## 2023-04-17 DIAGNOSIS — E46 Unspecified protein-calorie malnutrition: Secondary | ICD-10-CM | POA: Diagnosis not present

## 2023-04-17 DIAGNOSIS — N390 Urinary tract infection, site not specified: Secondary | ICD-10-CM | POA: Diagnosis not present

## 2023-04-17 DIAGNOSIS — F17210 Nicotine dependence, cigarettes, uncomplicated: Secondary | ICD-10-CM | POA: Diagnosis not present

## 2023-04-25 DIAGNOSIS — S32592D Other specified fracture of left pubis, subsequent encounter for fracture with routine healing: Secondary | ICD-10-CM | POA: Diagnosis not present

## 2023-04-25 DIAGNOSIS — S32591D Other specified fracture of right pubis, subsequent encounter for fracture with routine healing: Secondary | ICD-10-CM | POA: Diagnosis not present

## 2023-04-25 DIAGNOSIS — S0633AD Contusion and laceration of cerebrum, unspecified, with loss of consciousness status unknown, subsequent encounter: Secondary | ICD-10-CM | POA: Diagnosis not present

## 2023-04-25 DIAGNOSIS — M6281 Muscle weakness (generalized): Secondary | ICD-10-CM | POA: Diagnosis not present

## 2023-05-23 DIAGNOSIS — M6281 Muscle weakness (generalized): Secondary | ICD-10-CM | POA: Diagnosis not present

## 2023-05-23 DIAGNOSIS — S32591D Other specified fracture of right pubis, subsequent encounter for fracture with routine healing: Secondary | ICD-10-CM | POA: Diagnosis not present

## 2023-05-23 DIAGNOSIS — S32592D Other specified fracture of left pubis, subsequent encounter for fracture with routine healing: Secondary | ICD-10-CM | POA: Diagnosis not present

## 2023-05-23 DIAGNOSIS — S0633AD Contusion and laceration of cerebrum, unspecified, with loss of consciousness status unknown, subsequent encounter: Secondary | ICD-10-CM | POA: Diagnosis not present

## 2023-06-23 DIAGNOSIS — S32592D Other specified fracture of left pubis, subsequent encounter for fracture with routine healing: Secondary | ICD-10-CM | POA: Diagnosis not present

## 2023-06-23 DIAGNOSIS — S32591D Other specified fracture of right pubis, subsequent encounter for fracture with routine healing: Secondary | ICD-10-CM | POA: Diagnosis not present

## 2023-06-23 DIAGNOSIS — S0633AD Contusion and laceration of cerebrum, unspecified, with loss of consciousness status unknown, subsequent encounter: Secondary | ICD-10-CM | POA: Diagnosis not present

## 2023-06-23 DIAGNOSIS — M6281 Muscle weakness (generalized): Secondary | ICD-10-CM | POA: Diagnosis not present

## 2023-06-24 DIAGNOSIS — Z681 Body mass index (BMI) 19 or less, adult: Secondary | ICD-10-CM | POA: Diagnosis not present

## 2023-06-24 DIAGNOSIS — F17210 Nicotine dependence, cigarettes, uncomplicated: Secondary | ICD-10-CM | POA: Diagnosis not present

## 2023-06-24 DIAGNOSIS — Z79899 Other long term (current) drug therapy: Secondary | ICD-10-CM | POA: Diagnosis not present

## 2023-06-24 DIAGNOSIS — R030 Elevated blood-pressure reading, without diagnosis of hypertension: Secondary | ICD-10-CM | POA: Diagnosis not present

## 2023-06-24 DIAGNOSIS — G4700 Insomnia, unspecified: Secondary | ICD-10-CM | POA: Diagnosis not present

## 2023-06-24 DIAGNOSIS — M542 Cervicalgia: Secondary | ICD-10-CM | POA: Diagnosis not present

## 2023-06-26 DIAGNOSIS — Z79899 Other long term (current) drug therapy: Secondary | ICD-10-CM | POA: Diagnosis not present

## 2023-07-22 DIAGNOSIS — F17210 Nicotine dependence, cigarettes, uncomplicated: Secondary | ICD-10-CM | POA: Diagnosis not present

## 2023-07-22 DIAGNOSIS — Z79899 Other long term (current) drug therapy: Secondary | ICD-10-CM | POA: Diagnosis not present

## 2023-07-22 DIAGNOSIS — R030 Elevated blood-pressure reading, without diagnosis of hypertension: Secondary | ICD-10-CM | POA: Diagnosis not present

## 2023-07-22 DIAGNOSIS — Z681 Body mass index (BMI) 19 or less, adult: Secondary | ICD-10-CM | POA: Diagnosis not present

## 2023-07-22 DIAGNOSIS — M542 Cervicalgia: Secondary | ICD-10-CM | POA: Diagnosis not present

## 2023-07-22 DIAGNOSIS — G4700 Insomnia, unspecified: Secondary | ICD-10-CM | POA: Diagnosis not present

## 2023-07-23 DIAGNOSIS — S0633AD Contusion and laceration of cerebrum, unspecified, with loss of consciousness status unknown, subsequent encounter: Secondary | ICD-10-CM | POA: Diagnosis not present

## 2023-07-23 DIAGNOSIS — M6281 Muscle weakness (generalized): Secondary | ICD-10-CM | POA: Diagnosis not present

## 2023-07-23 DIAGNOSIS — S32591D Other specified fracture of right pubis, subsequent encounter for fracture with routine healing: Secondary | ICD-10-CM | POA: Diagnosis not present

## 2023-07-23 DIAGNOSIS — S32592D Other specified fracture of left pubis, subsequent encounter for fracture with routine healing: Secondary | ICD-10-CM | POA: Diagnosis not present

## 2023-07-24 DIAGNOSIS — Z79899 Other long term (current) drug therapy: Secondary | ICD-10-CM | POA: Diagnosis not present

## 2023-08-21 DIAGNOSIS — F17210 Nicotine dependence, cigarettes, uncomplicated: Secondary | ICD-10-CM | POA: Diagnosis not present

## 2023-08-21 DIAGNOSIS — Z681 Body mass index (BMI) 19 or less, adult: Secondary | ICD-10-CM | POA: Diagnosis not present

## 2023-08-21 DIAGNOSIS — Z79899 Other long term (current) drug therapy: Secondary | ICD-10-CM | POA: Diagnosis not present

## 2023-08-21 DIAGNOSIS — G4700 Insomnia, unspecified: Secondary | ICD-10-CM | POA: Diagnosis not present

## 2023-08-21 DIAGNOSIS — M542 Cervicalgia: Secondary | ICD-10-CM | POA: Diagnosis not present

## 2023-08-23 DIAGNOSIS — S32592D Other specified fracture of left pubis, subsequent encounter for fracture with routine healing: Secondary | ICD-10-CM | POA: Diagnosis not present

## 2023-08-23 DIAGNOSIS — S0633AD Contusion and laceration of cerebrum, unspecified, with loss of consciousness status unknown, subsequent encounter: Secondary | ICD-10-CM | POA: Diagnosis not present

## 2023-08-23 DIAGNOSIS — S32591D Other specified fracture of right pubis, subsequent encounter for fracture with routine healing: Secondary | ICD-10-CM | POA: Diagnosis not present

## 2023-08-23 DIAGNOSIS — M6281 Muscle weakness (generalized): Secondary | ICD-10-CM | POA: Diagnosis not present

## 2023-08-25 DIAGNOSIS — Z79899 Other long term (current) drug therapy: Secondary | ICD-10-CM | POA: Diagnosis not present

## 2023-09-22 DIAGNOSIS — S0633AD Contusion and laceration of cerebrum, unspecified, with loss of consciousness status unknown, subsequent encounter: Secondary | ICD-10-CM | POA: Diagnosis not present

## 2023-09-22 DIAGNOSIS — R627 Adult failure to thrive: Secondary | ICD-10-CM | POA: Diagnosis not present

## 2023-09-22 DIAGNOSIS — K861 Other chronic pancreatitis: Secondary | ICD-10-CM | POA: Diagnosis not present

## 2023-09-22 DIAGNOSIS — I639 Cerebral infarction, unspecified: Secondary | ICD-10-CM | POA: Diagnosis not present

## 2023-09-22 DIAGNOSIS — S32592D Other specified fracture of left pubis, subsequent encounter for fracture with routine healing: Secondary | ICD-10-CM | POA: Diagnosis not present

## 2023-09-22 DIAGNOSIS — J449 Chronic obstructive pulmonary disease, unspecified: Secondary | ICD-10-CM | POA: Diagnosis not present

## 2023-09-22 DIAGNOSIS — M6281 Muscle weakness (generalized): Secondary | ICD-10-CM | POA: Diagnosis not present

## 2023-09-22 DIAGNOSIS — G4700 Insomnia, unspecified: Secondary | ICD-10-CM | POA: Diagnosis not present

## 2023-09-22 DIAGNOSIS — Z79899 Other long term (current) drug therapy: Secondary | ICD-10-CM | POA: Diagnosis not present

## 2023-09-22 DIAGNOSIS — M542 Cervicalgia: Secondary | ICD-10-CM | POA: Diagnosis not present

## 2023-09-22 DIAGNOSIS — S32591D Other specified fracture of right pubis, subsequent encounter for fracture with routine healing: Secondary | ICD-10-CM | POA: Diagnosis not present

## 2023-09-22 DIAGNOSIS — Z681 Body mass index (BMI) 19 or less, adult: Secondary | ICD-10-CM | POA: Diagnosis not present

## 2023-09-22 DIAGNOSIS — F17210 Nicotine dependence, cigarettes, uncomplicated: Secondary | ICD-10-CM | POA: Diagnosis not present

## 2023-09-26 DIAGNOSIS — J449 Chronic obstructive pulmonary disease, unspecified: Secondary | ICD-10-CM | POA: Diagnosis not present

## 2023-09-26 DIAGNOSIS — J309 Allergic rhinitis, unspecified: Secondary | ICD-10-CM | POA: Diagnosis not present

## 2023-09-26 DIAGNOSIS — I10 Essential (primary) hypertension: Secondary | ICD-10-CM | POA: Diagnosis not present

## 2023-09-26 DIAGNOSIS — Z760 Encounter for issue of repeat prescription: Secondary | ICD-10-CM | POA: Diagnosis not present

## 2023-09-26 DIAGNOSIS — Z79899 Other long term (current) drug therapy: Secondary | ICD-10-CM | POA: Diagnosis not present

## 2023-09-26 DIAGNOSIS — N3281 Overactive bladder: Secondary | ICD-10-CM | POA: Diagnosis not present

## 2023-09-26 DIAGNOSIS — K219 Gastro-esophageal reflux disease without esophagitis: Secondary | ICD-10-CM | POA: Diagnosis not present

## 2023-10-23 DIAGNOSIS — M6281 Muscle weakness (generalized): Secondary | ICD-10-CM | POA: Diagnosis not present

## 2023-10-23 DIAGNOSIS — S32591D Other specified fracture of right pubis, subsequent encounter for fracture with routine healing: Secondary | ICD-10-CM | POA: Diagnosis not present

## 2023-10-23 DIAGNOSIS — S0633AD Contusion and laceration of cerebrum, unspecified, with loss of consciousness status unknown, subsequent encounter: Secondary | ICD-10-CM | POA: Diagnosis not present

## 2023-10-23 DIAGNOSIS — S32592D Other specified fracture of left pubis, subsequent encounter for fracture with routine healing: Secondary | ICD-10-CM | POA: Diagnosis not present

## 2023-10-28 DIAGNOSIS — K861 Other chronic pancreatitis: Secondary | ICD-10-CM | POA: Diagnosis not present

## 2023-10-28 DIAGNOSIS — Z79899 Other long term (current) drug therapy: Secondary | ICD-10-CM | POA: Diagnosis not present

## 2023-10-28 DIAGNOSIS — G4700 Insomnia, unspecified: Secondary | ICD-10-CM | POA: Diagnosis not present

## 2023-10-28 DIAGNOSIS — F17210 Nicotine dependence, cigarettes, uncomplicated: Secondary | ICD-10-CM | POA: Diagnosis not present

## 2023-10-28 DIAGNOSIS — M542 Cervicalgia: Secondary | ICD-10-CM | POA: Diagnosis not present

## 2023-10-28 DIAGNOSIS — I10 Essential (primary) hypertension: Secondary | ICD-10-CM | POA: Diagnosis not present

## 2023-10-28 DIAGNOSIS — Z681 Body mass index (BMI) 19 or less, adult: Secondary | ICD-10-CM | POA: Diagnosis not present

## 2023-10-28 DIAGNOSIS — Z87891 Personal history of nicotine dependence: Secondary | ICD-10-CM | POA: Diagnosis not present

## 2023-10-28 DIAGNOSIS — J449 Chronic obstructive pulmonary disease, unspecified: Secondary | ICD-10-CM | POA: Diagnosis not present

## 2023-10-28 DIAGNOSIS — I639 Cerebral infarction, unspecified: Secondary | ICD-10-CM | POA: Diagnosis not present

## 2023-10-30 DIAGNOSIS — Z79899 Other long term (current) drug therapy: Secondary | ICD-10-CM | POA: Diagnosis not present

## 2023-11-03 ENCOUNTER — Telehealth: Payer: Self-pay

## 2023-11-03 ENCOUNTER — Ambulatory Visit
Admission: EM | Admit: 2023-11-03 | Discharge: 2023-11-03 | Disposition: A | Discharge status: Home / Self Care | Attending: Nurse Practitioner | Admitting: Nurse Practitioner

## 2023-11-03 DIAGNOSIS — R350 Frequency of micturition: Secondary | ICD-10-CM | POA: Insufficient documentation

## 2023-11-03 DIAGNOSIS — L8990 Pressure ulcer of unspecified site, unspecified stage: Secondary | ICD-10-CM | POA: Insufficient documentation

## 2023-11-03 LAB — POCT URINE DIPSTICK
Bilirubin, UA: NEGATIVE
Blood, UA: NEGATIVE
Glucose, UA: 100 mg/dL — AB
Ketones, POC UA: NEGATIVE mg/dL
Leukocytes, UA: NEGATIVE
Nitrite, UA: NEGATIVE
Protein Ur, POC: 100 mg/dL — AB
Spec Grav, UA: 1.025 (ref 1.010–1.025)
Urobilinogen, UA: 0.2 U/dL
pH, UA: 5.5 (ref 5.0–8.0)

## 2023-11-03 MED ORDER — CHLORHEXIDINE GLUCONATE 4 % EX SOLN: CUTANEOUS | 0 refills | Status: AC

## 2023-11-03 MED ORDER — CHLORHEXIDINE GLUCONATE 4 % EX SOLN: CUTANEOUS | 0 refills | Status: DC

## 2023-11-03 MED ORDER — DOXYCYCLINE HYCLATE 100 MG PO CAPS: 100.0000 mg | ORAL_CAPSULE | Freq: Two times a day (BID) | ORAL | 0 refills | Status: AC

## 2023-11-03 MED ORDER — DOXYCYCLINE HYCLATE 100 MG PO CAPS: 100.0000 mg | ORAL_CAPSULE | Freq: Two times a day (BID) | ORAL | 0 refills | Status: DC

## 2023-11-03 NOTE — ED Provider Notes (Signed)
 [facility]    CSN: [id number] Arrival date & time: 11/03/23  1614      History   Chief Complaint Chief Complaint  Patient presents with   Urinary Frequency    HPI [name] is a 73 y.o. female.   The history is provided by a caregiver.   Patient brought in by her caregiver for complaints of a possible UTI and for wounds noted to the left side of the patient's body.  Caregiver reports that patient has been having more urinary frequency.  She also states that the patient has 2 wounds around her left hip and pelvic region.  She denies fever, chills, chest pain, abdominal pain, nausea, vomiting, diarrhea, or rash.  Caregiver reports that patient does have underlying history of Alzheimer's disease.  Caregiver reports that she was concerned that the patient may be developing sepsis from the wounds.  Caregiver states that she was not aware that the wounds were present.  Past Medical History:  Diagnosis Date   Acute blood loss anemia    Arrhythmia    Arthritis    C. difficile colitis    Cancer (HCC)    COPD (chronic obstructive pulmonary disease) (HCC)    Diverticulosis    Fibromyalgia    GERD (gastroesophageal reflux disease)    GI hemorrhage    H/O: substance abuse (HCC) 2005   narcotic usage due to chronic back pain   Heart murmur    Hypertension    Pancreatic cyst    Pneumococcal meningitis    PONV (postoperative nausea and vomiting)    Renal cyst    Stroke (cerebrum) [facility])     Patient Active Problem List   Diagnosis Date Noted   Focal hemorrhagic contusion of cerebrum (HCC) 12/22/2022   Generalized weakness 12/10/2022   Fall 12/09/2022   PNA (pneumonia) 12/09/2022   Malnutrition of moderate degree 12/04/2022   Hypokalemia 12/03/2022   Elevated LFTs 12/03/2022   Idiopathic chronic pancreatitis (HCC) 12/03/2022   Dilated cbd, acquired 12/03/2022   Opioid dependence (HCC) 12/02/2022   Transaminasemia 12/02/2022   Pressure ulcer of sacral  region, stage 4 (HCC) 06/11/2022   RSV (acute bronchiolitis due to respiratory syncytial virus) 02/28/2022   Protein-calorie malnutrition, severe 08/03/2020   COPD (chronic obstructive pulmonary disease) (HCC) 08/02/2020   Acute respiratory failure with hypoxia (HCC) 08/01/2020   Severe recurrent major depression with psychotic features (HCC) 07/03/2020   Hallucinations    History of substance abuse (HCC) 05/10/2020   Psychosis, atypical (HCC) 05/09/2020   Shortness of breath 04/20/2020   Chronic diastolic CHF (congestive heart failure) (HCC) 04/20/2020   Nausea & vomiting 04/20/2020   Prolonged QT interval 04/20/2020   COPD with acute exacerbation (HCC) 06/20/2017   DOE (dyspnea on exertion) 06/19/2017   Cervical dystonia 11/27/2016   C. 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   Bacterial meningitis 05/15/2016   Melena 05/14/2016   History of narcotic use 05/14/2016   Acute encephalopathy    Fever    Left hemiparesis (HCC)    Generalized OA    Fibromyalgia    Tobacco abuse    Substance abuse (HCC)    Benign essential HTN    Tachycardia    Chronic pain syndrome    Anemia, posthemorrhagic, acute    Leukocytosis    Septic thrombophlebitis of  sagittal sinus    Acute respiratory failure (HCC)    Streptococcus pneumoniae meningitis    Streptococcal bacteremia    Meningitis    History of stroke 04/12/2016   Stroke (cerebrum) (HCC) 04/12/2016   Neck pain 04/04/2016   Chronic abdominal pain 02/19/2016   Essential hypertension 02/19/2016   Bacterial conjunctivitis 02/19/2016   Health care maintenance 02/19/2016   Anxiety 05/08/2006   Cigarette smoker 05/08/2006   ASTHMA, UNSPECIFIED 05/08/2006   GERD (gastroesophageal reflux disease) 05/08/2006   CONSTIPATION 05/08/2006   CONVULSIONS, SEIZURES, NOS  05/08/2006    Past Surgical History:  Procedure Laterality Date   ABDOMINAL HYSTERECTOMY     ABDOMINAL SURGERY     APPENDECTOMY     BIOPSY  04/22/2020   Procedure: BIOPSY;  Surgeon: Teressa Toribio SQUIBB, MD;  Location: [facility];  Service: Gastroenterology;;   BREAST SURGERY     CHOLECYSTECTOMY N/A 09/30/2012   Procedure: LAPAROSCOPIC CHOLECYSTECTOMY WITH INTRAOPERATIVE CHOLANGIOGRAM;  Surgeon: Donnice POUR. Belinda, MD;  Location: [facility];  Service: General;  Laterality: N/A;   COLONOSCOPY WITH PROPOFOL  N/A 01/02/2023   Procedure: COLONOSCOPY WITH PROPOFOL ;  Surgeon: Leigh Elspeth SQUIBB, MD;  Location: [facility];  Service: Gastroenterology;  Laterality: N/A;   ESOPHAGOGASTRODUODENOSCOPY (EGD) WITH PROPOFOL  N/A 04/22/2020   Procedure: ESOPHAGOGASTRODUODENOSCOPY (EGD) WITH PROPOFOL ;  Surgeon: Teressa Toribio SQUIBB, MD;  Location: [facility];  Service: Gastroenterology;  Laterality: N/A;   ESOPHAGOGASTRODUODENOSCOPY (EGD) WITH PROPOFOL  N/A 12/31/2022   Procedure: ESOPHAGOGASTRODUODENOSCOPY (EGD) WITH PROPOFOL ;  Surgeon: Leigh Elspeth SQUIBB, MD;  Location: [facility]COPY;  Service: Gastroenterology;  Laterality: N/A;   FOREIGN BODY REMOVAL  12/31/2022   Procedure: FOREIGN BODY REMOVAL;  Surgeon: Leigh Elspeth SQUIBB, MD;  Location: [facility];  Service: Gastroenterology;;   FRACTURE SURGERY     right upper arm   GIVENS CAPSULE STUDY N/A 01/02/2023   Procedure: GIVENS CAPSULE STUDY;  Surgeon: Leigh Elspeth SQUIBB, MD;  Location: [facility];  Service: Gastroenterology;  Laterality: N/A;   OVARIAN CYST SURGERY      OB History   No obstetric history on file.      Home Medications    Prior to Admission medications   Medication Sig Start Date End Date Taking? Authorizing Provider  acetaminophen  (TYLENOL ) 500 MG tablet Take 2 tablets (1,000 mg total) by mouth every 6 (six) hours. 12/27/22   Vicci Burnard SAUNDERS, PA-C  albuterol  (VENTOLIN  HFA) 108 (90 Base) MCG/ACT inhaler Inhale 2 puffs into the  lungs every 4 (four) hours as needed for wheezing or shortness of breath. 02/28/22   [provider]  amoxicillin -clavulanate (AUGMENTIN ) 875-125 MG tablet Take 1 tablet by mouth every 12 (twelve) hours. 03/10/23   Barrett, Warren SAILOR, PA-C  ARIPiprazole  (ABILIFY ) 5 MG tablet Take 5 mg by mouth daily. 12/20/22   [provider]  ascorbic acid  (VITAMIN C ) 500 MG tablet Take 500 mg by mouth daily.    [provider]  carvedilol  (COREG ) 6.25 MG tablet Take 2 tablets (12.5 mg total) by mouth 2 (two) times daily. 12/28/22   Vicci Burnard SAUNDERS, PA-C  Cholecalciferol  (VITAMIN D3) 50 MCG (2000 UT) capsule Take 2,000 Units by mouth 4 (four) times daily.    [provider]  docusate sodium  (COLACE) 100 MG capsule Take 1 capsule (100 mg total) by mouth 2 (two) times daily. 01/05/23   Paola Dreama SAILOR, MD  donepezil  (ARICEPT ) 10 MG tablet Take 10 mg by mouth daily. 02/15/22   [provider]  doxepin  (SINEQUAN ) 50 MG capsule Take  50 mg by mouth daily.    [provider]  famotidine  (PEPCID ) 20 MG tablet Take 20 mg by mouth daily. 12/20/22   [provider]  fluticasone  (FLONASE ) 50 MCG/ACT nasal spray Place 1 spray into both nostrils 2 (two) times daily as needed for allergies. 11/12/22   [provider]  ipratropium (ATROVENT ) 0.03 % nasal spray Place 2 sprays into both nostrils See admin instructions. Use 2 sprays in each nostril once every three hours as needed for rhinitis.    [provider]  levocetirizine (XYZAL ) 5 MG tablet Take 5 mg by mouth daily. 11/22/22   [provider]  LORazepam  (ATIVAN ) 0.5 MG tablet Take 1 tablet (0.5 mg total) by mouth 2 (two) times daily. Do not administer AFTER 3 01/05/23   Paola Dreama SAILOR, MD  losartan  (COZAAR ) 100 MG tablet Take 0.5 tablets (50 mg total) by mouth daily. Patient taking differently: Take 100 mg by mouth daily. 12/10/22   Ricky Fines, MD  MAGNESIUM  CITRATE PO Take 1 capsule by  mouth 3 (three) times daily.    [provider]  magnesium  oxide (MAG-OX) 400 (240 Mg) MG tablet Take 1 tablet by mouth daily. 11/27/22   [provider]  methocarbamol  (ROBAXIN ) 500 MG tablet Take 1 tablet (500 mg total) by mouth every 6 (six) hours as needed for muscle spasms. 01/05/23   Paola Dreama SAILOR, MD  mirtazapine  (REMERON ) 7.5 MG tablet Take 7.5 mg by mouth at bedtime. 02/20/22   [provider]  Multiple Vitamins-Minerals (THERA-TABS M) TABS Take 1 tablet by mouth daily. 11/27/22   [provider]  nitrofurantoin , macrocrystal-monohydrate, (MACROBID ) 100 MG capsule Take 1 capsule (100 mg total) by mouth every 12 (twelve) hours. 12/28/22   Vicci Burnard SAUNDERS, PA-C  Nutritional Supplements (ENSURE ORIGINAL) LIQD Take 1 Container by mouth 3 (three) times daily. 12/19/22   [provider]  OLANZapine  (ZYPREXA ) 10 MG tablet Take 1 tablet (10 mg total) by mouth at bedtime. 03/08/22   Maree, Pratik D, DO  Omega-3 Fatty Acids (FISH OIL) 1000 MG CAPS Take 1 capsule by mouth 3 (three) times daily. 12/20/22   [provider]  polyethylene glycol (MIRALAX  / GLYCOLAX ) 17 g packet Take 17 g by mouth daily. 01/05/23   Paola Dreama SAILOR, MD  potassium chloride  SA (KLOR-CON  M) 20 MEQ tablet Take 1 tablet (20 mEq total) by mouth 2 (two) times daily. 03/10/23   Barrett, Warren SAILOR, PA-C  Prasterone (INTRAROSA) 6.5 MG INST Place 1 suppository vaginally at bedtime.    [provider]  tamsulosin  (FLOMAX ) 0.4 MG CAPS capsule Take 1 capsule (0.4 mg total) by mouth daily. 01/05/23   Paola Dreama SAILOR, MD  temazepam  (RESTORIL ) 22.5 MG capsule Take 22.5 mg by mouth at bedtime.    [provider]  traZODone  (DESYREL ) 50 MG tablet Take 25 mg by mouth at bedtime. 11/27/22   [provider]  TRELEGY ELLIPTA 100-62.5-25 MCG/ACT AEPB Inhale 1 puff into the lungs daily. 02/28/22   [provider]    Family History Family History  Problem  Relation Age of Onset   Alzheimer's disease Mother    Heart disease Father    Prostate cancer Father     Social History Social History   Tobacco Use   Smoking status: Every Day    Current packs/day: 0.40    Average packs/day: 0.4 packs/day for 49.0 years (19.6 ttl pk-yrs)    Types: Cigarettes   Smokeless tobacco: Never  Vaping  Use   Vaping status: Never Used  Substance Use Topics   Alcohol use: No   Drug use: No    Comment: Methadone  for narcotic use     Allergies   Nsaids, Tylenol  [acetaminophen ], Codeine, Tolmetin, and Tramadol    Review of Systems Review of Systems Per HPI  Physical Exam Triage Vital Signs ED Triage Vitals  Encounter Vitals Group     BP --      Girls Systolic BP Percentile --      Girls Diastolic BP Percentile --      Boys Systolic BP Percentile --      Boys Diastolic BP Percentile --      Pulse Rate 11/03/23 1627 87     Resp 11/03/23 1627 18     Temp 11/03/23 1627 98.3 F (36.8 C)     Temp Source 11/03/23 1627 Oral     SpO2 11/03/23 1627 94 %     Weight --      Height --      Head Circumference --      Peak Flow --      Pain Score 11/03/23 1631 0     Pain Loc --      Pain Education --      Exclude from Growth Chart --    No data found.  Updated Vital Signs Pulse 87   Temp 98.3 F (36.8 C) (Oral)   Resp 18   SpO2 94%   Visual Acuity Right Eye Distance:   Left Eye Distance:   Bilateral Distance:    Right Eye Near:   Left Eye Near:    Bilateral Near:     Physical Exam Vitals and nursing note reviewed.  Constitutional:      General: She is not in acute distress.    Appearance: Normal appearance.  HENT:     Head: Normocephalic.  Eyes:     Extraocular Movements: Extraocular movements intact.     Pupils: Pupils are equal, round, and reactive to light.  Cardiovascular:     Rate and Rhythm: Normal rate and regular rhythm.     Pulses: Normal pulses.     Heart sounds: Normal heart sounds.  Pulmonary:     Effort:  Pulmonary effort is normal.     Breath sounds: Normal breath sounds.  Abdominal:     General: Bowel sounds are normal.     Palpations: Abdomen is soft.  Musculoskeletal:     Cervical back: Normal range of motion.  Skin:    General: Skin is warm and dry.     Findings: Wound present.         Comments: See attached images  Neurological:     Mental Status: She is alert. Mental status is at baseline. She is disoriented.  Psychiatric:        Mood and Affect: Mood normal.        Behavior: Behavior normal.        UC Treatments / Results  Labs (all labs ordered are listed, but only abnormal results are displayed) Labs Reviewed  POCT URINE DIPSTICK - Abnormal; Notable for the following components:      Result Value   Glucose, UA =100 (*)    Protein Ur, POC =100 (*)    All other components within normal limits  URINE CULTURE    EKG   Radiology No results found.  Procedures Procedures (including critical care time)  Medications Ordered in UC Medications - No data to display  Initial Impression / Assessment and Plan / UC Course  I have reviewed the triage vital signs and the nursing notes.  Pertinent labs & imaging results that were available during my care of the patient were reviewed by me and considered in my medical decision making (see chart for details).  Urinalysis does not indicate an obvious urinary tract infection.  Urine culture is pending.  Wounds consistent with possible decubitus ulcer, will start patient on infection prophylaxis with doxycycline  100 mg.  Will also send prescription for Hibiclens  to cleanse the affected areas twice daily.  Referral for wound care was also made.  Caregiver was advised they will need to call to schedule an appointment.  Supportive care recommendations were provided and discussed to include fluids, rest, over-the-counter analgesics, and to monitor for worsening.  Caregiver was in agreement with this plan of care and verbalizes  understanding.  All questions were answered.  Patient stable for discharge.   Final Clinical Impressions(s) / UC Diagnoses   Final diagnoses:  Urinary frequency   Discharge Instructions   None    ED Prescriptions   None    PDMP not reviewed this encounter.   Gilmer Etta PARAS, NP 11/03/23 1708

## 2023-11-03 NOTE — ED Triage Notes (Signed)
 Urinary frequency, lower abdominal pressure x 6 months per pt.

## 2023-11-03 NOTE — Discharge Instructions (Signed)
 A urine culture has been ordered.  You will be contacted if the pending test result is abnormal.  You will also have access to the results via MyChart. Administer medication as prescribed. She may take over-the-counter Tylenol  as needed for pain, fever, general discomfort. Keep the affected areas clean and dry. Cover the areas if they begin to drain. Seek care immediately if she develops fever, chills, foul-smelling drainage from the site, or other concerns. I have placed a referral for wound care for further evaluation.  Please call as soon as possible to schedule an appointment. Follow-up as needed.

## 2023-11-03 NOTE — Telephone Encounter (Signed)
 Caregiver wanted scripts sent to [facility]

## 2023-11-05 LAB — URINE CULTURE

## 2023-11-23 DIAGNOSIS — M6281 Muscle weakness (generalized): Secondary | ICD-10-CM | POA: Diagnosis not present

## 2023-11-23 DIAGNOSIS — S0633AD Contusion and laceration of cerebrum, unspecified, with loss of consciousness status unknown, subsequent encounter: Secondary | ICD-10-CM | POA: Diagnosis not present

## 2023-11-23 DIAGNOSIS — S32591D Other specified fracture of right pubis, subsequent encounter for fracture with routine healing: Secondary | ICD-10-CM | POA: Diagnosis not present

## 2023-11-23 DIAGNOSIS — S32592D Other specified fracture of left pubis, subsequent encounter for fracture with routine healing: Secondary | ICD-10-CM | POA: Diagnosis not present

## 2023-11-25 DIAGNOSIS — Z87891 Personal history of nicotine dependence: Secondary | ICD-10-CM | POA: Diagnosis not present

## 2023-12-23 DIAGNOSIS — M6281 Muscle weakness (generalized): Secondary | ICD-10-CM | POA: Diagnosis not present

## 2023-12-23 DIAGNOSIS — S0633AD Contusion and laceration of cerebrum, unspecified, with loss of consciousness status unknown, subsequent encounter: Secondary | ICD-10-CM | POA: Diagnosis not present

## 2023-12-23 DIAGNOSIS — S32591D Other specified fracture of right pubis, subsequent encounter for fracture with routine healing: Secondary | ICD-10-CM | POA: Diagnosis not present

## 2023-12-23 DIAGNOSIS — S32592D Other specified fracture of left pubis, subsequent encounter for fracture with routine healing: Secondary | ICD-10-CM | POA: Diagnosis not present
